# Patient Record
Sex: Female | Born: 1995 | Race: Black or African American | Hispanic: No | Marital: Single | Smoking: Never smoker
Health system: Southern US, Community
[De-identification: ages and names within clinical notes are randomized; demographics above are authoritative.]

## PROBLEM LIST (undated history)

## (undated) DIAGNOSIS — I10 Essential (primary) hypertension: Secondary | ICD-10-CM

## (undated) DIAGNOSIS — R000 Tachycardia, unspecified: Secondary | ICD-10-CM

## (undated) DIAGNOSIS — E049 Nontoxic goiter, unspecified: Secondary | ICD-10-CM

## (undated) DIAGNOSIS — L83 Acanthosis nigricans: Secondary | ICD-10-CM

## (undated) DIAGNOSIS — Z9109 Other allergy status, other than to drugs and biological substances: Secondary | ICD-10-CM

## (undated) DIAGNOSIS — E109 Type 1 diabetes mellitus without complications: Secondary | ICD-10-CM

## (undated) DIAGNOSIS — E1143 Type 2 diabetes mellitus with diabetic autonomic (poly)neuropathy: Secondary | ICD-10-CM

## (undated) DIAGNOSIS — E063 Autoimmune thyroiditis: Secondary | ICD-10-CM

## (undated) DIAGNOSIS — E11649 Type 2 diabetes mellitus with hypoglycemia without coma: Secondary | ICD-10-CM

## (undated) DIAGNOSIS — E1142 Type 2 diabetes mellitus with diabetic polyneuropathy: Secondary | ICD-10-CM

## (undated) DIAGNOSIS — Z9289 Personal history of other medical treatment: Secondary | ICD-10-CM

## (undated) HISTORY — DX: Acanthosis nigricans: L83

## (undated) HISTORY — DX: Type 2 diabetes mellitus with hypoglycemia without coma: E11.649

## (undated) HISTORY — DX: Type 2 diabetes mellitus with diabetic polyneuropathy: E11.42

## (undated) HISTORY — DX: Nontoxic goiter, unspecified: E04.9

## (undated) HISTORY — DX: Other allergy status, other than to drugs and biological substances: Z91.09

## (undated) HISTORY — DX: Type 1 diabetes mellitus without complications: E10.9

## (undated) HISTORY — DX: Autoimmune thyroiditis: E06.3

## (undated) HISTORY — DX: Tachycardia, unspecified: R00.0

## (undated) HISTORY — DX: Type 2 diabetes mellitus with diabetic autonomic (poly)neuropathy: E11.43

## (undated) HISTORY — PX: COLONOSCOPY: SHX174

---
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
## 2014-02-07 ENCOUNTER — Other Ambulatory Visit: Payer: Self-pay | Admitting: *Deleted

## 2014-02-07 DIAGNOSIS — E119 Type 2 diabetes mellitus without complications: Secondary | ICD-10-CM

## 2014-02-07 MED ORDER — INSULIN GLARGINE 100 UNIT/ML SOLOSTAR PEN: PEN_INJECTOR | SUBCUTANEOUS | Status: DC

## 2014-02-25 ENCOUNTER — Ambulatory Visit: Payer: Medicaid Other | Admitting: "Endocrinology

## 2014-02-25 ENCOUNTER — Ambulatory Visit (INDEPENDENT_AMBULATORY_CARE_PROVIDER_SITE_OTHER): Payer: Self-pay | Admitting: "Endocrinology

## 2014-02-25 ENCOUNTER — Encounter: Payer: Self-pay | Admitting: "Endocrinology

## 2014-02-25 VITALS — BP 124/85 | HR 110 | Wt 132.0 lb

## 2014-02-25 DIAGNOSIS — Z9119 Patient's noncompliance with other medical treatment and regimen: Secondary | ICD-10-CM

## 2014-02-25 DIAGNOSIS — Z91199 Patient's noncompliance with other medical treatment and regimen due to unspecified reason: Secondary | ICD-10-CM

## 2014-02-25 DIAGNOSIS — I471 Supraventricular tachycardia: Secondary | ICD-10-CM

## 2014-02-25 DIAGNOSIS — E063 Autoimmune thyroiditis: Secondary | ICD-10-CM

## 2014-02-25 DIAGNOSIS — E109 Type 1 diabetes mellitus without complications: Secondary | ICD-10-CM

## 2014-02-25 DIAGNOSIS — IMO0002 Reserved for concepts with insufficient information to code with codable children: Secondary | ICD-10-CM

## 2014-02-25 DIAGNOSIS — I10 Essential (primary) hypertension: Secondary | ICD-10-CM

## 2014-02-25 DIAGNOSIS — B353 Tinea pedis: Secondary | ICD-10-CM

## 2014-02-25 DIAGNOSIS — E10649 Type 1 diabetes mellitus with hypoglycemia without coma: Secondary | ICD-10-CM

## 2014-02-25 DIAGNOSIS — E108 Type 1 diabetes mellitus with unspecified complications: Secondary | ICD-10-CM

## 2014-02-25 DIAGNOSIS — R000 Tachycardia, unspecified: Secondary | ICD-10-CM

## 2014-02-25 DIAGNOSIS — E049 Nontoxic goiter, unspecified: Secondary | ICD-10-CM

## 2014-02-25 DIAGNOSIS — E1069 Type 1 diabetes mellitus with other specified complication: Secondary | ICD-10-CM

## 2014-02-25 DIAGNOSIS — E1043 Type 1 diabetes mellitus with diabetic autonomic (poly)neuropathy: Secondary | ICD-10-CM

## 2014-02-25 DIAGNOSIS — G990 Autonomic neuropathy in diseases classified elsewhere: Secondary | ICD-10-CM

## 2014-02-25 DIAGNOSIS — R634 Abnormal weight loss: Secondary | ICD-10-CM

## 2014-02-25 DIAGNOSIS — E1065 Type 1 diabetes mellitus with hyperglycemia: Secondary | ICD-10-CM

## 2014-02-25 DIAGNOSIS — E1042 Type 1 diabetes mellitus with diabetic polyneuropathy: Secondary | ICD-10-CM

## 2014-02-25 DIAGNOSIS — Z23 Encounter for immunization: Secondary | ICD-10-CM

## 2014-02-25 LAB — POCT GLYCOSYLATED HEMOGLOBIN (HGB A1C): Hemoglobin A1C: >14

## 2014-02-25 LAB — GLUCOSE, POCT (MANUAL RESULT ENTRY): POC Glucose: 124 mg/dl — AB (ref 70–99)

## 2014-02-25 MED ORDER — INSULIN ASPART 100 UNIT/ML FLEXPEN: PEN_INJECTOR | SUBCUTANEOUS | Status: DC

## 2014-02-25 MED ORDER — KETOCONAZOLE 2 % EX CREA: 1.0000 "application " | TOPICAL_CREAM | Freq: Every day | CUTANEOUS | Status: DC

## 2014-02-25 MED ORDER — LISINOPRIL 2.5 MG PO TABS: 2.5000 mg | ORAL_TABLET | Freq: Every day | ORAL | Status: DC

## 2014-02-25 NOTE — Patient Instructions (Signed)
Follow up visit in one month.  

## 2014-02-25 NOTE — Progress Notes (Signed)
Subjective:  Patient Name: Lindsey Murillo Date of Birth: [birth year]  MRN: [id number]  Kitt Course  presents to the office today for follow-up evaluation and management of her type 1 diabetes, acanthosis nigricans, goiter, thyroiditis, tachycardia, peripheral neuropathy, autonomic neuropathy, and severe noncompliance.  HISTORY OF PRESENT ILLNESS:   Lindsey Murillo is a 18 y.o. African American young lady.   Lindsey Murillo was unaccompanied.  1. The patient was first referred to me on 03/23/05 by her primary care provider, Dr. Mickie Hillier, [place], for evaluation and management of new onset diabetes. Patient was 18 years old.   A. Beginning in about September 2006, the patient began to develop polyuria and polydipsia. She went to her primary care provider, who checked her blood sugar. Blood glucose in the office was 501. He admitted her to [facility] on or about 02/03/11 with the diagnosis of diabetes. Her hemoglobin A1c was 12.5%. Her insulin C-peptide was 2.18 (normal 0.8-3.9). Her glucose was 224. She was hospitalized for 3-4 days and had diabetes education as an inpatient. In retrospect the patient had had acanthosis nigricans for about a year. Dr. Wolfgang Phoenix started her on Lantus at 16 units at bedtime. He also put her on Humalog lispro insulin by sliding scale at meals. At the time of her presentation to me, she was having some blood sugars in the 50s and 60s and others were greater than 600.   B. Patient was having a lot of visual blurring. She had frequent hunger pains. She had several low blood sugars in the early morning hours. She was premenarchal. Past medical history was positive for asthma and allergies. She and her siblings lived with her mother. She was in the fifth grade getting A's, B's, and C's. She was playing team basketball and team soccer. Family history was positive for type 2 diabetes in the mother, maternal grandmother, and cousin. Mother was very overweight.  Mother also had a goiter.   C. On physical examination, her weight was 116.6 pounds. Her height was 60-3/4 inches. Her BMI was 22.3, which was at the 90th percentile and placed her in the overweight category. She was somewhat passive. Her insight was poor. She had a 25 g thyroid gland. She had 4+ acanthosis nigricans of the posterior and anterior neck. She had 3+ acanthosis of the axillae. Her breasts were early Tanner stage I-2. Abdomen was large. Laboratory data in our office showed a hemoglobin A1c of 9.4%. Her TSH was 2.58, her free T4 was 1.18, and her free T3 was 4.2. Her TPO antibody was elevated at 56.9. Her C-peptide was 0.89 (normal 0.80-3.9).  D. It appeared at that time that the patient really had a combination of type I and type 2 diabetes mellitus. Her overly fat adipose cells were making cytokines that caused resistance to insulin. Her young, healthy beta cells in her pancreas had previously made excess amounts of insulin, which in turn caused several adverse effects, to include acanthosis nigricans and increased gastric acid production which resulted in her being hungrier, eating more, and gaining more weight. At the time of her diagnosis in September, however, even though her C-peptide levels were in the mid-normal range, they were not high enough to control her blood sugars. At the time of her visit to me, she was making only small amounts of C-peptide. Dr.Luking had made the correct assessment that the patient would require a multiple daily injection (MDI) insulin regimen. Because functionally her diabetes was closer to type I than type II (  type I.4), and because she would need insulin supplies, blood glucose test strips, and other items in the way typically used by type I diabetics, I administratively classified her as having type 1 diabetes. We performed additional diabetes education in our office for the patient and her mother as part of her Diabetes Survival Skills Program. I continued her  Lantus dose, but converted her Humalog sliding scale regimen to our two-component (Correction Dose and Food Dose) regimen.   2. During the past 9 years we have had some areas of success and some areas of poor results.  A. During the 18 months after her first visit, she continued to gain height and weight along the 95th to 98th percent curves. At age 82, the growth velocities for height and weight began to slow. The patient's height has been virtually static for the last 3 years. Patient's weight has fluctuated a great deal, but she has been lowing weight steadily for the past year.   B. Her hemoglobin A1c values have varied from 6.9- >14%.  On 09/12/07 her C-peptide was 4.99. On 05/28/08 her C-peptide was 2.75.  C. Part of that variability in her BGs has been due to her non-compliance with her diabetes care plan, but part of the variability has also been due to the frequency with which she attended clinic. In 2007 she came to clinic 3 times. In 2008 she came twice. In 2009 she came once. In 2010 she came 4 times. In 2011 she came twice. In 2012 she came to clinic 3 times. In 2013 she came once. In 2014 she came three times. Today is her first visit of 2015.  3. The patient's last [facility] visit was on 03/13/13. In the interim, she has been fairly healthy. She went to the ED in January 2014 for nasal congestion and sinusitis. She went to the ED again in April, that time for a right thigh abscess. Lantus dose remains at 50 units, when she takes it. She is on the Novolog 150/30/5 plan, when she follows it. She misses many doses of both insulins. She took lisinopril for a while, then stopped it, but can't explain why. Lindsey Murillo said she has been checking her BGs 3 times per day, but her BG download revealed that she was lying. She says that on a depression scale of 1-10 she is good at a 2. She feels that her depression is better because she's been taking better care of herself.  That was also a lie.   4. Pertinent  Review of Systems:  Constitutional: The patient initially said that she felt "good". She has not been very tired lately.   Eyes: Vision seems to be good. There are no recognized eye problems. Her last eye examination was in January 2014.  Neck: The patient has no complaints of anterior neck swelling, soreness, tenderness, pressure, discomfort, or difficulty swallowing.   Heart: Sometimes her heart beats fast even if she is not physically active. She is not aware of her heart rate increasing with exercise or other physical activity. The patient has no complaints of palpitations, irregular heart beats, chest pain, or chest pressure.   Gastrointestinal: She has not been having bloating after meals or constipation recently. She patient has no complaints of excessive hunger, acid reflux, upset stomach, stomach aches or pains, or diarrhea.  Legs: Her knees occasionally hurt. Muscle mass and strength seem normal. There are no complaints of numbness, tingling, burning, or pain. No edema is noted.  Feet: There are no  obvious foot problems. There are no complaints of numbness, tingling, burning, or pain. No edema is noted. Neurologic: There are no recognized problems with muscle movement and strength, sensation, or coordination. GYN: LMP was two weeks ago. Cycles have been regular. Hypoglycemia: She had not had hypoglycemia very often. She had one 59 two weeks ago.   5. BG printout: According to the BG meter she brought with her, she has checked BGs 10 times in the past 3 weeks. She says that she has another meter at home that has more BG checks. Her BGs varied from 182 to 418. She says that the 418 was her mother's BG.   PAST MEDICAL, FAMILY, AND SOCIAL HISTORY  Past Medical History  Diagnosis Date  . Type 1 diabetes mellitus in patient age 3-19 years with HbA1C goal below 7.5   . Acanthosis nigricans, acquired   . Overweight for pediatric patient   . Hypoglycemia associated with diabetes   . Goiter    . Tachycardia   . Diabetic autonomic neuropathy   . Diabetic peripheral neuropathy   . Thyroiditis, autoimmune   . Asthma   . Environmental allergies     Family History  Problem Relation Age of Onset  . Diabetes Mother     Type II DM  . Thyroid disease Mother   . Diabetes Maternal Grandmother     Type II DM  . Diabetes Cousin     Type II DM    Current outpatient prescriptions:glucagon 1 MG injection, Follow package directions for low blood sugar., Disp: 2 each, Rfl: 3;  Insulin Glargine (LANTUS SOLOSTAR) 100 UNIT/ML Solostar Pen, Use up to 50 units daily, Disp: 5 pen, Rfl: 1;  NOVOLOG FLEXPEN 100 UNIT/ML FlexPen, USE AS DIRECTED UP TO 50 UNITS PER SLIDING SCALE., Disp: 5 pen, Rfl: 6 albuterol (PROVENTIL HFA;VENTOLIN HFA) 108 (90 BASE) MCG/ACT inhaler, Inhale 2 puffs into the lungs every 6 (six) hours as needed for wheezing., Disp: , Rfl: ;  sulfamethoxazole-trimethoprim (SEPTRA DS) 800-160 MG per tablet, Take 1 tablet by mouth every 12 (twelve) hours., Disp: 20 tablet, Rfl: 0  Allergies as of 02/25/2014  . (No Known Allergies)     reports that she has never smoked. She has never used smokeless tobacco. She reports that she does not drink alcohol or use illicit drugs. Pediatric History  Patient Guardian Status  . Mother:  Doring,Luciana   Other Topics Concern  . Not on file   Social History Narrative  . No narrative on file    1. School and Family: Patient graduated from high school. She is not attending [employer] now, but wants to return in January. She would like to major in athletic training. 2. Activities: She walks 20-30 minutes about 3 times per week. Her stamina has improved. She is still unemployed. 3. Primary Care Provider: Dr. Wolfgang Phoenix  REVIEW OF SYSTEMS: There are no other significant problems involving Lindsey Murillo's other body systems.   Objective:  Vital Signs:  BP 124/85  Pulse 110  Wt 132 lb (59.875 kg)   Ht Readings from Last 3  Encounters:  09/05/13 5\' 7"  (1.702 m) (86%*, Z = 1.07)  09/03/13 5\' 8"  (1.727 m) (93%*, Z = 1.46)  05/24/13 5\' 8"  (1.727 m) (93%*, Z = 1.47)   * Growth percentiles are based on CDC 2-20 Years data.   Wt Readings from Last 3 Encounters:  02/25/14 132 lb (59.875 kg) (58%*, Z = 0.21)  09/05/13 140 lb 6 oz (63.674 kg) (72%*,  Z = 0.59)  09/03/13 137 lb (62.143 kg) (68%*, Z = 0.46)   * Growth percentiles are based on CDC 2-20 Years data.   There is no height on file to calculate BSA. No height on file for this encounter. 58%ile (Z=0.21) based on CDC 2-20 Years weight-for-age data.  PHYSICAL EXAM:  Constitutional: The patient appears healthy and more slender today.  She has lost 12 lbs since last visit. Her heart rate has decreased from 112 to 110.  Head: The head is normocephalic. Face: The face appears normal. There are no obvious dysmorphic features. Eyes: There is no obvious arcus or proptosis.Her eyes are dry. Ears: The ears are normally placed and appear externally normal. Mouth: The oropharynx and tongue appear normal. Dentition appears to be normal for age. Her mouth is relatively dry.  Neck: The neck appears to be visibly normal. No carotid bruits are noted. The thyroid gland is smaller today, but still enlarged at 23-24 grams in size. The left lobe is larger, but both lobes are enlarged. The consistency of the thyroid gland is relatively firm on the left. She is tender to palpation today in the right mid-lobe, just as she was at last visit. She has 2+ acanthosis nigricans.   Lungs: The lungs are clear to auscultation. Air movement is good. Heart: Heart rate and rhythm are regular. Heart sounds S1 and S2 are normal. I did not appreciate any pathologic cardiac murmurs. Abdomen: The abdomen is normal in size for the patient's age. Bowel sounds are normal. There is no obvious hepatomegaly, splenomegaly, or other mass effect.  Arms: Muscle size and bulk are normal for age. Hands: There  is no obvious tremor. Phalangeal and metacarpophalangeal joints are normal. Palmar muscles are normal for age. Palmar skin is normal. Palmar moisture is also normal. Legs: Muscles appear normal for age. No edema is present. Feet: Feet are normally formed. She has 1+ calluses. Dorsalis pedal pulses are normal 1+ bilaterally. She has 1+ tinea pedis.  Neurologic: Strength is normal for age in both the upper and lower extremities. Muscle tone is normal. Sensation to touch is normal in both the legs and both feet.     LAB DATA: Hemoglobin A1c today is >14%, compared with > 14% at last visit and with >14% at the visit prior.   Labs 09/03/13: CBC Hgb 15, Hct 40.7%; Sodium 130, CO2 26, glucose 625,   Labs 11/28/12: CMP was normal, except for glucose of 386.; cholesterol 231, triglycerides 234, HDL 64, LDL 120; urinary microalbumin/creatinine ratio was 8.2; TSH 1.175, free T4 1.26, free T3  2.7    Labs 08/26/11: CMP had glucose of 225, AST of 52, and ALT of 55. TSH was 1.008, free T4 1.23, free T3 2.9. Microalbumin/creatinine ratio was 4.5.    Assessment and Plan:   ASSESSMENT:  1. Type 1 diabetes mellitus: Patient's blood glucose control is terrible once again. When I confronted her with her A1c value she admitted that she has often been skipping both insulins. She can't/won't answer my question, "Why aren't you taking your insulins and checking your BGs?"  2. Hypoglycemia: she has not had any documented low BGs this month. The 59 she claimed to have had two weeks ago was not in the memory of the meter she brought with her today. 3. Goiter: The thyroid gland is smaller, but also tender again today in the right lobe, c/w a flare up of Hashimoto's disease. The waxing and waning of thyroid gland size and the  gland tenderness are also c/w evolving Hashimoto's disease.  4. Hashimoto's disease: Her thyroiditis is clinically active again today.  5. Peripheral neuropathy: She shows no evidence of this today.  6.  Autonomic neuropathy and tachycardia: These problems are still severe, but are also reversible if she gets the BGs under control. 7. Dehydration: She is not dehydrated today. 8. Hypertension: Her DBP is elevated again. She needs to resume taking lisinopril. 9. Tinea pedis: She needs ketoconazole.  10. Weight loss,unintentional: She is severely under-insulinized.   PLAN:  1. Diagnostic: BG checks at meals and at bedtime.    2. Therapeutic: Get back on her plan of checking blood sugars at mealtimes and bedtime and taking the appropriate Humalog doses. Re-start lisinopril, 2.5 mg/day  and ketoconazole 2% cream, apply twice daily to feet.  3. Patient education: She can be a healthy and happy adult and a better athlete if she controls her blood sugars better. 4. Follow-up: 1 month  Level of Service: This visit lasted in excess of 55 minutes. More than 50% of the visit was devoted to counseling.  Sherrlyn Hock, MD

## 2014-02-26 DIAGNOSIS — E1065 Type 1 diabetes mellitus with hyperglycemia: Secondary | ICD-10-CM | POA: Insufficient documentation

## 2014-02-26 DIAGNOSIS — E109 Type 1 diabetes mellitus without complications: Secondary | ICD-10-CM | POA: Insufficient documentation

## 2014-02-26 DIAGNOSIS — E108 Type 1 diabetes mellitus with unspecified complications: Secondary | ICD-10-CM

## 2014-02-26 DIAGNOSIS — Z9119 Patient's noncompliance with other medical treatment and regimen: Secondary | ICD-10-CM | POA: Insufficient documentation

## 2014-04-01 ENCOUNTER — Ambulatory Visit: Payer: Medicaid Other | Admitting: "Endocrinology

## 2014-05-21 ENCOUNTER — Ambulatory Visit: Payer: Medicaid Other | Admitting: "Endocrinology

## 2014-08-13 ENCOUNTER — Encounter (HOSPITAL_COMMUNITY): Payer: Self-pay | Admitting: Emergency Medicine

## 2014-08-13 ENCOUNTER — Emergency Department (HOSPITAL_COMMUNITY)
Admission: EM | Admit: 2014-08-13 | Discharge: 2014-08-13 | Disposition: A | Discharge status: Home / Self Care | Payer: 59 | Attending: Emergency Medicine | Admitting: Emergency Medicine

## 2014-08-13 DIAGNOSIS — Z792 Long term (current) use of antibiotics: Secondary | ICD-10-CM | POA: Diagnosis not present

## 2014-08-13 DIAGNOSIS — E1065 Type 1 diabetes mellitus with hyperglycemia: Secondary | ICD-10-CM | POA: Diagnosis not present

## 2014-08-13 DIAGNOSIS — N390 Urinary tract infection, site not specified: Secondary | ICD-10-CM | POA: Diagnosis not present

## 2014-08-13 DIAGNOSIS — N760 Acute vaginitis: Secondary | ICD-10-CM | POA: Diagnosis not present

## 2014-08-13 DIAGNOSIS — E860 Dehydration: Secondary | ICD-10-CM | POA: Insufficient documentation

## 2014-08-13 DIAGNOSIS — Z3202 Encounter for pregnancy test, result negative: Secondary | ICD-10-CM | POA: Diagnosis not present

## 2014-08-13 DIAGNOSIS — R739 Hyperglycemia, unspecified: Secondary | ICD-10-CM

## 2014-08-13 DIAGNOSIS — Z79899 Other long term (current) drug therapy: Secondary | ICD-10-CM | POA: Diagnosis not present

## 2014-08-13 DIAGNOSIS — Z794 Long term (current) use of insulin: Secondary | ICD-10-CM | POA: Insufficient documentation

## 2014-08-13 DIAGNOSIS — R000 Tachycardia, unspecified: Secondary | ICD-10-CM | POA: Diagnosis not present

## 2014-08-13 DIAGNOSIS — J45909 Unspecified asthma, uncomplicated: Secondary | ICD-10-CM | POA: Diagnosis not present

## 2014-08-13 DIAGNOSIS — R300 Dysuria: Secondary | ICD-10-CM | POA: Diagnosis present

## 2014-08-13 LAB — CBC
HEMATOCRIT: 41.4 % (ref 36.0–46.0)
HEMOGLOBIN: 14.5 g/dL (ref 12.0–15.0)
MCH: 29.1 pg (ref 26.0–34.0)
MCHC: 35 g/dL (ref 30.0–36.0)
MCV: 83 fL (ref 78.0–100.0)
Platelets: 218 10*3/uL (ref 150–400)
RBC: 4.99 MIL/uL (ref 3.87–5.11)
RDW: 11.9 % (ref 11.5–15.5)
WBC: 7.9 10*3/uL (ref 4.0–10.5)

## 2014-08-13 LAB — BASIC METABOLIC PANEL
Anion gap: 14 (ref 5–15)
BUN: 13 mg/dL (ref 6–23)
CO2: 27 mmol/L (ref 19–32)
Calcium: 9.6 mg/dL (ref 8.4–10.5)
Chloride: 94 mmol/L — ABNORMAL LOW (ref 96–112)
Creatinine, Ser: 0.71 mg/dL (ref 0.50–1.10)
GFR calc non Af Amer: >90 mL/min (ref >90)
Glucose, Bld: 400 mg/dL — ABNORMAL HIGH (ref 70–99)
Potassium: 4.3 mmol/L (ref 3.5–5.1)
Sodium: 135 mmol/L (ref 135–145)

## 2014-08-13 LAB — URINE MICROSCOPIC-ADD ON

## 2014-08-13 LAB — URINALYSIS, ROUTINE W REFLEX MICROSCOPIC
Bilirubin Urine: NEGATIVE
Glucose, UA: >1000 mg/dL — AB
NITRITE: NEGATIVE
PH: 5.5 (ref 5.0–8.0)
Specific Gravity, Urine: 1.02 (ref 1.005–1.030)
Urobilinogen, UA: 0.2 mg/dL (ref 0.0–1.0)

## 2014-08-13 LAB — PREGNANCY, URINE: PREG TEST UR: NEGATIVE

## 2014-08-13 LAB — CBG MONITORING, ED
Glucose-Capillary: 360 mg/dL — ABNORMAL HIGH (ref 70–99)
Glucose-Capillary: 240 mg/dL — ABNORMAL HIGH (ref 70–99)

## 2014-08-13 MED ORDER — INSULIN ASPART 100 UNIT/ML ~~LOC~~ SOLN
10.0000 [IU] | Freq: Once | SUBCUTANEOUS | Status: AC
  Administered 2014-08-13: 10 [IU] via SUBCUTANEOUS
  Filled 2014-08-13: qty 1

## 2014-08-13 MED ORDER — SODIUM CHLORIDE 0.9 % IV BOLUS (SEPSIS)
1000.0000 mL | Freq: Once | INTRAVENOUS | Status: AC
  Administered 2014-08-13: 1000 mL via INTRAVENOUS

## 2014-08-13 MED ORDER — CEPHALEXIN 500 MG PO CAPS: 500.0000 mg | ORAL_CAPSULE | Freq: Four times a day (QID) | ORAL | Status: DC

## 2014-08-13 MED ORDER — FLUCONAZOLE 150 MG PO TABS: 150.0000 mg | ORAL_TABLET | Freq: Once | ORAL | Status: DC

## 2014-08-13 MED ORDER — DEXTROSE 5 % IV SOLN
1.0000 g | Freq: Once | INTRAVENOUS | Status: AC
  Administered 2014-08-13: 1 g via INTRAVENOUS
  Filled 2014-08-13: qty 10

## 2014-08-13 NOTE — ED Notes (Signed)
Patient complaining of burning with urination x 4 days.

## 2014-08-13 NOTE — Discharge Instructions (Signed)
Please call your doctor for a followup appointment within 24-48 hours. When you talk to your doctor please let them know that you were seen in the emergency department and have them acquire all of your records so that they can discuss the findings with you and formulate a treatment plan to fully care for your new and ongoing problems. ° °

## 2014-08-13 NOTE — ED Provider Notes (Signed)
CSN: [id number]     Arrival date & time 08/13/14  0727 History   First MD Initiated Contact with Patient 08/13/14 [phone]     Chief Complaint  Patient presents with  . Dysuria     (Consider location/radiation/quality/duration/timing/severity/associated sxs/prior Treatment) HPI Comments: The patient is a 19 year old female, she is an insulin-requiring diabetic. She presents with a complaint of dysuria which has been present for approximately 4 days. This is fluctuating in intensity, sometimes it is mild, sometimes it is severe, it is worse with wiping after urination, not associated with any systemic symptoms including fevers chills nausea or vomiting. She has had no medications prior to arrival, denies any sexual activity ever, states that she has good blood sugar control and takes her insulin with meals, she has not had any insulin this morning and she has not eaten. Review of the medical record shows that she had a hemoglobin A1c drawn in October 2015 which was over 14.  Patient is a 19 y.o. female presenting with dysuria. The history is provided by the patient and a parent.  Dysuria   Past Medical History  Diagnosis Date  . Type 1 diabetes mellitus in patient age 35-19 years with HbA1C goal below 7.5   . Acanthosis nigricans, acquired   . Overweight for pediatric patient   . Hypoglycemia associated with diabetes   . Goiter   . Tachycardia   . Diabetic autonomic neuropathy   . Diabetic peripheral neuropathy   . Thyroiditis, autoimmune   . Asthma   . Environmental allergies    History reviewed. No pertinent past surgical history. Family History  Problem Relation Age of Onset  . Diabetes Mother     Type II DM  . Thyroid disease Mother   . Diabetes Maternal Grandmother     Type II DM  . Diabetes Cousin     Type II DM   History  Substance Use Topics  . Smoking status: Never Smoker   . Smokeless tobacco: Never Used  . Alcohol Use: No   OB History    No data available      Review of Systems  Genitourinary: Positive for dysuria.  All other systems reviewed and are negative.     Allergies  Review of patient's allergies indicates no known allergies.  Home Medications   Prior to Admission medications   Medication Sig Start Date End Date Taking? Authorizing Provider  albuterol (PROVENTIL HFA;VENTOLIN HFA) 108 (90 BASE) MCG/ACT inhaler Inhale 2 puffs into the lungs every 6 (six) hours as needed for wheezing.   Yes Historical Provider, MD  glucagon 1 MG injection Follow package directions for low blood sugar. 01/05/12  Yes Sherrlyn Hock, MD  ibuprofen (ADVIL,MOTRIN) 200 MG tablet Take 400 mg by mouth every 6 (six) hours as needed for moderate pain.   Yes Historical Provider, MD  insulin aspart (NOVOLOG FLEXPEN) 100 UNIT/ML FlexPen Use according to the 2-component method at meals and at bedtime. Patient taking differently: Inject 0-20 Units into the skin 4 (four) times daily -  with meals and at bedtime. Use according to the 2-component method at meals and at bedtime. 02/25/14 02/26/15 Yes Sherrlyn Hock, MD  Insulin Glargine (LANTUS SOLOSTAR) 100 UNIT/ML Solostar Pen Use up to 50 units daily Patient taking differently: Inject 50 Units into the skin at bedtime.  02/07/14  Yes Sherrlyn Hock, MD  cephALEXin (KEFLEX) 500 MG capsule Take 1 capsule (500 mg total) by mouth 4 (four) times daily. 08/13/14  Noemi Chapel, MD  fluconazole (DIFLUCAN) 150 MG tablet Take 1 tablet (150 mg total) by mouth once. 08/13/14   Noemi Chapel, MD  ketoconazole (NIZORAL) 2 % cream Apply 1 application topically daily. Patient not taking: Reported on 08/13/2014 02/25/14   Sherrlyn Hock, MD  lisinopril (PRINIVIL,ZESTRIL) 2.5 MG tablet Take 1 tablet (2.5 mg total) by mouth daily. Patient not taking: Reported on 08/13/2014 02/25/14   Sherrlyn Hock, MD  sulfamethoxazole-trimethoprim (SEPTRA DS) 800-160 MG per tablet Take 1 tablet by mouth every 12 (twelve) hours. Patient not  taking: Reported on 08/13/2014 09/03/13   [name], PA-C   BP 113/83 mmHg  Pulse 90  Temp(Src) 97.8 F (36.6 C) (Oral)  Resp 16  Ht 5\' 4"  (1.626 m)  Wt 138 lb (62.596 kg)  BMI 23.68 kg/m2  SpO2 100%  LMP 07/22/2014 Physical Exam  Constitutional: She appears well-developed and well-nourished. No distress.  HENT:  Head: Normocephalic and atraumatic.  Mouth/Throat: Oropharynx is clear and moist. No oropharyngeal exudate.  Eyes: Conjunctivae and EOM are normal. Pupils are equal, round, and reactive to light. Right eye exhibits no discharge. Left eye exhibits no discharge. No scleral icterus.  Neck: Normal range of motion. Neck supple. No JVD present. No thyromegaly present.  Cardiovascular: Regular rhythm, normal heart sounds and intact distal pulses.  Exam reveals no gallop and no friction rub.   No murmur heard. Mildly tachycardic  Pulmonary/Chest: Effort normal and breath sounds normal. No respiratory distress. She has no wheezes. She has no rales.  Abdominal: Soft. Bowel sounds are normal. She exhibits no distension and no mass. There is tenderness (minimal left mid abdominal tenderness, no guarding or rebound).  Genitourinary:  Chaperone present for exam, small amount of white discharge around the vaginal opening, no labial abscesses, several areas of erythematous submillimeter papules, no vesicles, no pustules, no induration  Musculoskeletal: Normal range of motion. She exhibits no edema or tenderness.  Lymphadenopathy:    She has no cervical adenopathy.  Neurological: She is alert. Coordination normal.  Skin: Skin is warm and dry. No rash noted. No erythema.  Psychiatric: She has a normal mood and affect. Her behavior is normal.  Nursing note and vitals reviewed.   ED Course  Procedures (including critical care time) Labs Review Labs Reviewed  URINALYSIS, ROUTINE W REFLEX MICROSCOPIC - Abnormal; Notable for the following:    Glucose, UA >1000 (*)    Hgb urine dipstick LARGE  (*)    Ketones, ur >80 (*)    Protein, ur TRACE (*)    Leukocytes, UA TRACE (*)    All other components within normal limits  URINE MICROSCOPIC-ADD ON - Abnormal; Notable for the following:    Squamous Epithelial / LPF MANY (*)    Bacteria, UA MANY (*)    All other components within normal limits  BASIC METABOLIC PANEL - Abnormal; Notable for the following:    Chloride 94 (*)    Glucose, Bld 400 (*)    All other components within normal limits  CBG MONITORING, ED - Abnormal; Notable for the following:    Glucose-Capillary 360 (*)    All other components within normal limits  CBG MONITORING, ED - Abnormal; Notable for the following:    Glucose-Capillary 240 (*)    All other components within normal limits  PREGNANCY, URINE  CBC    Imaging Review No results found.    MDM   Final diagnoses:  Vaginitis  UTI (lower urinary tract infection)  Hyperglycemia  Dehydration    The patient does have a rash in the vaginal area, mildly tachycardic, suspect hyperglycemia, possible urinary tract infection though vaginitis is likely given exam. Labs pending, patient appears hemodynamically stable.  No signs of DKA on blood work, no AG, no acidosis - ketones in UA likely from dehdyradtion - 2  L of IVF and rocephin and heart rate is now normal - VS as below, pt in agreement for d/c.  Filed Vitals:   08/13/14 0733 08/13/14 0947  BP: 114/86 113/83  Pulse: 115 90  Temp: 97.8 F (36.6 C)   TempSrc: Oral   Resp: 14 16  Height: 5\' 4"  (1.626 m)   Weight: 138 lb (62.596 kg)   SpO2: 99% 100%     Meds given in ED:  Medications  cefTRIAXone (ROCEPHIN) 1 g in dextrose 5 % 50 mL IVPB (0 g Intravenous Stopped 08/13/14 0935)  sodium chloride 0.9 % bolus 1,000 mL (0 mLs Intravenous Stopped 08/13/14 0940)  sodium chloride 0.9 % bolus 1,000 mL (0 mLs Intravenous Stopped 08/13/14 1047)  insulin aspart (novoLOG) injection 10 Units (10 Units Subcutaneous Given 08/13/14 0935)    New Prescriptions    CEPHALEXIN (KEFLEX) 500 MG CAPSULE    Take 1 capsule (500 mg total) by mouth 4 (four) times daily.   FLUCONAZOLE (DIFLUCAN) 150 MG TABLET    Take 1 tablet (150 mg total) by mouth once.      Noemi Chapel, MD 08/13/14 [phone]

## 2014-09-25 ENCOUNTER — Ambulatory Visit: Payer: 59 | Admitting: Family Medicine

## 2015-01-07 ENCOUNTER — Emergency Department (HOSPITAL_COMMUNITY)
Admission: EM | Admit: 2015-01-07 | Discharge: 2015-01-07 | Disposition: A | Discharge status: Home / Self Care | Payer: 59 | Attending: Emergency Medicine | Admitting: Emergency Medicine

## 2015-01-07 ENCOUNTER — Encounter (HOSPITAL_COMMUNITY): Payer: Self-pay | Admitting: Emergency Medicine

## 2015-01-07 DIAGNOSIS — Z79899 Other long term (current) drug therapy: Secondary | ICD-10-CM | POA: Diagnosis not present

## 2015-01-07 DIAGNOSIS — Z794 Long term (current) use of insulin: Secondary | ICD-10-CM | POA: Insufficient documentation

## 2015-01-07 DIAGNOSIS — Z9119 Patient's noncompliance with other medical treatment and regimen: Secondary | ICD-10-CM | POA: Insufficient documentation

## 2015-01-07 DIAGNOSIS — E1043 Type 1 diabetes mellitus with diabetic autonomic (poly)neuropathy: Secondary | ICD-10-CM | POA: Insufficient documentation

## 2015-01-07 DIAGNOSIS — R739 Hyperglycemia, unspecified: Secondary | ICD-10-CM

## 2015-01-07 DIAGNOSIS — E663 Overweight: Secondary | ICD-10-CM | POA: Diagnosis not present

## 2015-01-07 DIAGNOSIS — J45909 Unspecified asthma, uncomplicated: Secondary | ICD-10-CM | POA: Insufficient documentation

## 2015-01-07 DIAGNOSIS — E1065 Type 1 diabetes mellitus with hyperglycemia: Secondary | ICD-10-CM | POA: Insufficient documentation

## 2015-01-07 DIAGNOSIS — Z91199 Patient's noncompliance with other medical treatment and regimen due to unspecified reason: Secondary | ICD-10-CM

## 2015-01-07 LAB — CBC WITH DIFFERENTIAL/PLATELET
Basophils Absolute: 0 10*3/uL (ref 0.0–0.1)
Basophils Relative: 0 % (ref 0–1)
Eosinophils Absolute: 0.1 10*3/uL (ref 0.0–0.7)
Eosinophils Relative: 1 % (ref 0–5)
HEMATOCRIT: 36.9 % (ref 36.0–46.0)
HEMOGLOBIN: 12.9 g/dL (ref 12.0–15.0)
LYMPHS ABS: 2.1 10*3/uL (ref 0.7–4.0)
Lymphocytes Relative: 23 % (ref 12–46)
MCH: 29.2 pg (ref 26.0–34.0)
MCHC: 35 g/dL (ref 30.0–36.0)
MCV: 83.5 fL (ref 78.0–100.0)
MONO ABS: 0.5 10*3/uL (ref 0.1–1.0)
MONOS PCT: 5 % (ref 3–12)
NEUTROS ABS: 6.6 10*3/uL (ref 1.7–7.7)
NEUTROS PCT: 71 % (ref 43–77)
Platelets: 180 10*3/uL (ref 150–400)
RBC: 4.42 MIL/uL (ref 3.87–5.11)
RDW: 12.2 % (ref 11.5–15.5)
WBC: 9.2 10*3/uL (ref 4.0–10.5)

## 2015-01-07 LAB — URINE MICROSCOPIC-ADD ON

## 2015-01-07 LAB — BASIC METABOLIC PANEL
ANION GAP: 8 (ref 5–15)
BUN: 14 mg/dL (ref 6–20)
CALCIUM: 9 mg/dL (ref 8.9–10.3)
CHLORIDE: 96 mmol/L — AB (ref 101–111)
CO2: 29 mmol/L (ref 22–32)
CREATININE: 0.56 mg/dL (ref 0.44–1.00)
GFR calc non Af Amer: >60 mL/min (ref >60)
GLUCOSE: 412 mg/dL — AB (ref 65–99)
Potassium: 3.9 mmol/L (ref 3.5–5.1)
Sodium: 133 mmol/L — ABNORMAL LOW (ref 135–145)

## 2015-01-07 LAB — URINALYSIS, ROUTINE W REFLEX MICROSCOPIC
BILIRUBIN URINE: NEGATIVE
HGB URINE DIPSTICK: NEGATIVE
Ketones, ur: 15 mg/dL — AB
Leukocytes, UA: NEGATIVE
Nitrite: NEGATIVE
PH: 6 (ref 5.0–8.0)
Protein, ur: NEGATIVE mg/dL
SPECIFIC GRAVITY, URINE: 1.02 (ref 1.005–1.030)
Urobilinogen, UA: 0.2 mg/dL (ref 0.0–1.0)

## 2015-01-07 LAB — CBG MONITORING, ED
GLUCOSE-CAPILLARY: 280 mg/dL — AB (ref 65–99)
Glucose-Capillary: 374 mg/dL — ABNORMAL HIGH (ref 65–99)

## 2015-01-07 MED ORDER — INSULIN NPH ISOPHANE & REGULAR (70-30) 100 UNIT/ML ~~LOC~~ SUSP: 15.0000 [IU] | Freq: Two times a day (BID) | SUBCUTANEOUS | Status: DC

## 2015-01-07 MED ORDER — SODIUM CHLORIDE 0.9 % IV BOLUS (SEPSIS)
1000.0000 mL | Freq: Once | INTRAVENOUS | Status: AC
  Administered 2015-01-07: 1000 mL via INTRAVENOUS

## 2015-01-07 NOTE — Progress Notes (Signed)
CM called to ED for medication assistance. Pt stated that she works at [employer] in [place] and is currently insured. Pt also stated that she goes to [facility] for PCP follow up and DM management. Pt given number for Dr. Dorris Fetch and encouraged pt to call and schedule an appointment. Pt also instructed that the novolin 70/30 is a generic insulin that she can get for $25 at [facility]. Pt stated that she could afford that. I notified pt that insulin should be cheaper since she does have insurance. Pt verbalized understanding of above. CM also notified Dr. Thurnell Garbe to order the novolin 70/30 for pt at discharge, which will be more affordable for pt with insurance and without. Pt stated she has all other DM supplies that she would need.

## 2015-01-07 NOTE — ED Provider Notes (Signed)
CSN: [id number]     Arrival date & time 01/07/15  1438 History   First MD Initiated Contact with Patient 01/07/15 1541     Chief Complaint  Patient presents with  . Hyperglycemia     (Consider location/radiation/quality/duration/timing/severity/associated sxs/prior Treatment) HPI   Lindsey Murillo is a 19 y.o. female who presents for evaluation of hyperglycemia. She went to her PCP office today for a new visit. Labs done last week showed hemoglobin A1c high at 19. Today in the office. Her CBG was unreadable so they sent her here. She has been off her medications for greater than 1 year due to inability to afford them. She denies fever, chills, nausea, vomiting, weakness or dizziness. There are no other known modifying factors.   Past Medical History  Diagnosis Date  . Type 1 diabetes mellitus in patient age 3-19 years with HbA1C goal below 7.5   . Acanthosis nigricans, acquired   . Overweight for pediatric patient   . Hypoglycemia associated with diabetes   . Goiter   . Tachycardia   . Diabetic autonomic neuropathy   . Diabetic peripheral neuropathy   . Thyroiditis, autoimmune   . Asthma   . Environmental allergies    History reviewed. No pertinent past surgical history. Family History  Problem Relation Age of Onset  . Diabetes Mother     Type II DM  . Thyroid disease Mother   . Diabetes Maternal Grandmother     Type II DM  . Diabetes Cousin     Type II DM   Social History  Substance Use Topics  . Smoking status: Never Smoker   . Smokeless tobacco: Never Used  . Alcohol Use: No   OB History    No data available     Review of Systems  All other systems reviewed and are negative.     Allergies  Review of patient's allergies indicates no known allergies.  Home Medications   Prior to Admission medications   Medication Sig Start Date End Date Taking? Authorizing Provider  glucagon 1 MG injection Follow package directions for low blood sugar. 01/05/12    Sherrlyn Hock, MD  ibuprofen (ADVIL,MOTRIN) 200 MG tablet Take 400 mg by mouth every 6 (six) hours as needed for moderate pain.    Historical Provider, MD  insulin NPH-regular Human (NOVOLIN 70/30) (70-30) 100 UNIT/ML injection Inject 15 Units into the skin 2 (two) times daily with a meal. 01/07/15   Daleen Bo, MD   BP 114/82 mmHg  Pulse 102  Temp(Src) 98.2 F (36.8 C) (Oral)  Resp 18  Ht 5\' 8"  (1.727 m)  Wt 109 lb (49.442 kg)  BMI 16.58 kg/m2  SpO2 100%  LMP 12/27/2014 Physical Exam  Constitutional: She is oriented to person, place, and time. She appears well-developed and well-nourished. No distress.  HENT:  Head: Normocephalic and atraumatic.  Right Ear: External ear normal.  Left Ear: External ear normal.  Eyes: Conjunctivae and EOM are normal. Pupils are equal, round, and reactive to light.  Neck: Normal range of motion and phonation normal. Neck supple.  Cardiovascular: Normal rate, regular rhythm and normal heart sounds.   Pulmonary/Chest: Effort normal and breath sounds normal. She exhibits no bony tenderness.  Abdominal: Soft. There is no tenderness.  Musculoskeletal: Normal range of motion.  Neurological: She is alert and oriented to person, place, and time. No cranial nerve deficit or sensory deficit. She exhibits normal muscle tone. Coordination normal.  Skin: Skin is warm, dry and intact.  Psychiatric: She has a normal mood and affect. Her behavior is normal. Judgment and thought content normal.  Nursing note and vitals reviewed.   ED Course  Procedures (including critical care time) Medications  sodium chloride 0.9 % bolus 1,000 mL (0 mLs Intravenous Stopped 01/07/15 1754)    Patient Vitals for the past 24 hrs:  BP Temp Temp src Pulse Resp SpO2 Height Weight  01/07/15 1752 114/82 mmHg - - 102 18 100 % - -  01/07/15 1516 112/75 mmHg - - 93 - 100 % - -  01/07/15 1442 - - - - - - - 109 lb (49.442 kg)  01/07/15 1441 115/76 mmHg 98.2 F (36.8 C) Oral 104 16  99 % 5\' 8"  (1.727 m) -    6:22 PM Reevaluation with update and discussion. After initial assessment and treatment, an updated evaluation reveals no additional complaints, findings discussed with patient and family members, all questions were answered. Lindsey Murillo     Labs Review Labs Reviewed  BASIC METABOLIC PANEL - Abnormal; Notable for the following:    Sodium 133 (*)    Chloride 96 (*)    Glucose, Bld 412 (*)    All other components within normal limits  URINALYSIS, ROUTINE W REFLEX MICROSCOPIC (NOT AT [facility]) - Abnormal; Notable for the following:    Glucose, UA >1000 (*)    Ketones, ur 15 (*)    All other components within normal limits  URINE MICROSCOPIC-ADD ON - Abnormal; Notable for the following:    Squamous Epithelial / LPF FEW (*)    Bacteria, UA FEW (*)    All other components within normal limits  CBG MONITORING, ED - Abnormal; Notable for the following:    Glucose-Capillary 374 (*)    All other components within normal limits  CBC WITH DIFFERENTIAL/PLATELET  CBG MONITORING, ED    Imaging Review No results found. I have personally reviewed and evaluated these images and lab results as part of my medical decision-making.   EKG Interpretation None      MDM   Final diagnoses:  Hyperglycemia  Noncompliance    Hyperglycemia secondary to noncompliance. Doubt serous bacterial infection, metabolic instability or impending vascular collapse. Patient will be started on insulin 70/30, but will require adjustment based on her response. She will need close follow-up with her PCP.  Nursing Notes Reviewed/ Care Coordinated Applicable Imaging Reviewed Interpretation of Laboratory Data incorporated into ED treatment  The patient appears reasonably screened and/or stabilized for discharge and I doubt any other medical condition or other [facility] requiring further screening, evaluation, or treatment in the ED at this time prior to discharge.  Plan: Home Medications-  Insulin 70/30; Home Treatments- rest, fluids; return here if the recommended treatment, does not improve the symptoms; Recommended follow up- PCP 3 days     Daleen Bo, MD 01/07/15 [phone]

## 2015-01-07 NOTE — ED Notes (Signed)
[facility].  Hemoglobin A1C was 19.  Pt has a history of diabetes type 1.  Pt has not been on insulin for over one year.  Pt says she has not been able to financially get medications.  Current glucose 374.  Pt is denying any symptoms.

## 2015-01-07 NOTE — ED Notes (Signed)
cbg in triage 374, pt stated that she just ate as well

## 2015-01-07 NOTE — Discharge Instructions (Signed)

## 2015-05-09 ENCOUNTER — Encounter: Payer: Self-pay | Admitting: Nurse Practitioner

## 2015-05-09 ENCOUNTER — Ambulatory Visit (INDEPENDENT_AMBULATORY_CARE_PROVIDER_SITE_OTHER): Payer: Self-pay | Admitting: Nurse Practitioner

## 2015-05-09 VITALS — BP 104/72 | Ht 67.0 in | Wt 105.2 lb

## 2015-05-09 DIAGNOSIS — E109 Type 1 diabetes mellitus without complications: Secondary | ICD-10-CM

## 2015-05-09 LAB — POCT GLYCOSYLATED HEMOGLOBIN (HGB A1C): HEMOGLOBIN A1C: 16.2

## 2015-05-09 MED ORDER — INSULIN REGULAR HUMAN 100 UNIT/ML IJ SOLN: INTRAMUSCULAR | Status: DC

## 2015-05-09 MED ORDER — INSULIN NPH (HUMAN) (ISOPHANE) 100 UNIT/ML ~~LOC~~ SUSP: SUBCUTANEOUS | Status: DC

## 2015-05-12 ENCOUNTER — Encounter: Payer: Self-pay | Admitting: Nurse Practitioner

## 2015-05-12 DIAGNOSIS — E1065 Type 1 diabetes mellitus with hyperglycemia: Secondary | ICD-10-CM | POA: Insufficient documentation

## 2015-05-12 DIAGNOSIS — E109 Type 1 diabetes mellitus without complications: Secondary | ICD-10-CM | POA: Insufficient documentation

## 2015-05-12 NOTE — Progress Notes (Signed)
Subjective:  Presents with her mother for diabetes check up. Is out of her insulin. Was being seen by [facility]. Still does not have insurance but hopefully this will change at the first of the year. According to patient, FBS at home 160-170 on average. Fatigue. Weight loss. Was seeing Dr. Tobe Sos at one point.   Objective:   BP 104/72 mmHg  Ht 5\' 7"  (1.702 m)  Wt 105 lb 4 oz (47.741 kg)  BMI 16.48 kg/m2 NAD. Alert, oriented. Lungs clear. Heart RRR. Very thin.  Results for orders placed or performed in visit on 05/09/15  POCT HgB A1C  Result Value Ref Range   Hemoglobin A1C 16.2     Assessment:  Problem List Items Addressed This Visit      Endocrine   Type 1 diabetes mellitus not at goal [facility]) - Primary   Relevant Medications   insulin NPH Human (HUMULIN N,NOVOLIN N) 100 UNIT/ML injection   insulin regular (NOVOLIN R,HUMULIN R) 100 units/mL injection   Other Relevant Orders   POCT HgB A1C (Completed)     Plan:  Meds ordered this encounter  Medications  . insulin NPH Human (HUMULIN N,NOVOLIN N) 100 UNIT/ML injection    Sig: Inject 30 units subcu in am and 10 units at hs    Dispense:  10 mL    Refill:  2    Order Specific Question:  Supervising Provider    Answer:  Mikey Kirschner [[id number]]  . insulin regular (NOVOLIN R,HUMULIN R) 100 units/mL injection    Sig: Inject up to 15 units subcu per day QID based on sliding scale    Dispense:  10 mL    Refill:  11    Order Specific Question:  Supervising Provider    Answer:  Mikey Kirschner [[id number]]   Stop 70/30. Discussed difference in insulins and dosage. Given sliding scale for meals and bedtime per [facility] guidelines. Discussed risk associated with BS including DKA; seek help immediately if any problems. Urgent referral to Dr. Dorris Fetch for evaluation. Start insulin immediately. Call back sooner if needed.

## 2015-05-13 ENCOUNTER — Encounter: Payer: Self-pay | Admitting: Family Medicine

## 2015-06-03 ENCOUNTER — Encounter: Payer: Self-pay | Admitting: "Endocrinology

## 2015-06-03 ENCOUNTER — Ambulatory Visit (INDEPENDENT_AMBULATORY_CARE_PROVIDER_SITE_OTHER): Payer: BLUE CROSS/BLUE SHIELD | Admitting: "Endocrinology

## 2015-06-03 VITALS — BP 107/74 | HR 120 | Ht 67.0 in | Wt 100.0 lb

## 2015-06-03 DIAGNOSIS — E1065 Type 1 diabetes mellitus with hyperglycemia: Secondary | ICD-10-CM | POA: Diagnosis not present

## 2015-06-03 DIAGNOSIS — E108 Type 1 diabetes mellitus with unspecified complications: Secondary | ICD-10-CM

## 2015-06-03 DIAGNOSIS — IMO0002 Reserved for concepts with insufficient information to code with codable children: Secondary | ICD-10-CM

## 2015-06-03 MED ORDER — ACCU-CHEK AVIVA DEVI: Status: DC

## 2015-06-03 MED ORDER — GLUCOSE BLOOD VI STRP: ORAL_STRIP | Status: DC

## 2015-06-03 NOTE — Progress Notes (Signed)
Subjective:    Patient ID: Lindsey Murillo, female    DOB: May 10, 1996. Patient is being seen in consultation for management of diabetes requested by  Mickie Hillier, MD  Past Medical History  Diagnosis Date  . Type 1 diabetes mellitus in patient age 21-19 years with HbA1C goal below 7.5   . Acanthosis nigricans, acquired   . Overweight for pediatric patient   . Hypoglycemia associated with diabetes ([place])   . Goiter   . Tachycardia   . Diabetic autonomic neuropathy ([place])   . Diabetic peripheral neuropathy ([place])   . Thyroiditis, autoimmune   . Asthma   . Environmental allergies    History reviewed. No pertinent past surgical history. Social History   Social History  . Marital Status: Single    Spouse Name: N/A  . Number of Children: N/A  . Years of Education: N/A   Social History Main Topics  . Smoking status: Never Smoker   . Smokeless tobacco: Never Used  . Alcohol Use: No  . Drug Use: No  . Sexual Activity: No   Other Topics Concern  . None   Social History Narrative   Outpatient Encounter Prescriptions as of 06/03/2015  Medication Sig  . glucagon 1 MG injection Follow package directions for low blood sugar.  . ibuprofen (ADVIL,MOTRIN) 200 MG tablet Take 400 mg by mouth every 6 (six) hours as needed for moderate pain.  . [DISCONTINUED] insulin NPH Human (HUMULIN N,NOVOLIN N) 100 UNIT/ML injection Inject 30 units subcu in am and 10 units at hs (Patient taking differently: 15 Units 2 times daily at 12 noon and 4 pm. Inject 30 units subcu in am and 10 units at hs)  . [DISCONTINUED] insulin regular (NOVOLIN R,HUMULIN R) 100 units/mL injection Inject up to 15 units subcu per day QID based on sliding scale  . Blood Glucose Monitoring Suppl (ACCU-CHEK AVIVA) device Use as instructed  . glucose blood (ACCU-CHEK AVIVA) test strip Use to test 4 times a day   No facility-administered encounter medications on file as of 06/03/2015.   ALLERGIES: No Known  Allergies VACCINATION STATUS: Immunization History  Administered Date(s) Administered  . Influenza,inj,Quad PF,36+ Mos 03/13/2013, 02/25/2014    Diabetes She presents for her initial diabetic visit. She has type 1 diabetes mellitus. Onset time: She was diagnosed at age 30 years. Her disease course has been worsening. There are no hypoglycemic associated symptoms. Pertinent negatives for hypoglycemia include no confusion, headaches, pallor or seizures. Associated symptoms include blurred vision, fatigue, foot paresthesias, polydipsia, polyphagia, polyuria and weight loss. Pertinent negatives for diabetes include no chest pain. There are no hypoglycemic complications. Symptoms are worsening. Diabetic complications include peripheral neuropathy. Risk factors for coronary artery disease include diabetes mellitus and sedentary lifestyle. Current diabetic treatment includes insulin injections (She is using Humulin 15 units at night and 0-15 units on sliding scale). Compliance with diabetes treatment: She has questionable compliance given her A1c being greater than 14% at least for the last 3 years, most recent one being 16.2% Her weight is decreasing steadily. She is following a generally unhealthy diet. When asked about meal planning, she reported none. She has not had a previous visit with a dietitian. She never participates in exercise. Home blood sugar record trend: She did not bring any meter nor logs to review. She admits to not monitoring regularly.     Review of Systems  Constitutional: Positive for weight loss, fatigue and unexpected weight change. Negative for fever and chills.  HENT:  Negative for trouble swallowing and voice change.   Eyes: Positive for blurred vision. Negative for visual disturbance.  Respiratory: Negative for cough, shortness of breath and wheezing.   Cardiovascular: Negative for chest pain, palpitations and leg swelling.  Gastrointestinal: Negative for nausea, vomiting and  diarrhea.  Endocrine: Positive for polydipsia, polyphagia and polyuria. Negative for cold intolerance and heat intolerance.  Musculoskeletal: Negative for myalgias and arthralgias.  Skin: Negative for color change, pallor, rash and wound.  Neurological: Negative for seizures and headaches.  Psychiatric/Behavioral: Negative for suicidal ideas and confusion.    Objective:    BP 107/74 mmHg  Pulse 120  Ht 5\' 7"  (1.702 m)  Wt 100 lb (45.36 kg)  BMI 15.66 kg/m2  SpO2 97%  Wt Readings from Last 3 Encounters:  06/03/15 100 lb (45.36 kg)  05/09/15 105 lb 4 oz (47.741 kg)  01/07/15 109 lb (49.442 kg)    Physical Exam  Constitutional: She is oriented to person, place, and time.  She is very light build with history of recent weight loss.  HENT:  Head: Normocephalic and atraumatic.  Eyes: EOM are normal.  Neck: Normal range of motion. Neck supple. No tracheal deviation present. No thyromegaly present.  Cardiovascular: Normal rate and regular rhythm.   Pulmonary/Chest: Effort normal and breath sounds normal.  Abdominal: Soft. Bowel sounds are normal. There is no tenderness. There is no guarding.  Musculoskeletal: She exhibits no edema.  Cachectic looking with diffuse loss of skeletal muscles.  Neurological: She is alert and oriented to person, place, and time. She has normal reflexes. No cranial nerve deficit. Coordination normal.  Skin: Skin is warm and dry. No rash noted. No erythema. No pallor.  Poor skin turgor with signs of malnutrition.  Psychiatric:  She has reluctant affect.     CMP ( most recent) CMP     Component Value Date/Time   NA 133* 01/07/2015 1558   K 3.9 01/07/2015 1558   CL 96* 01/07/2015 1558   CO2 29 01/07/2015 1558   GLUCOSE 412* 01/07/2015 1558   BUN 14 01/07/2015 1558   CREATININE 0.56 01/07/2015 1558   CREATININE 0.66 03/13/2013 1725   CALCIUM 9.0 01/07/2015 1558   PROT 7.5 03/13/2013 1725   ALBUMIN 4.4 03/13/2013 1725   AST 43* 03/13/2013 1725    ALT 29 03/13/2013 1725   ALKPHOS 137* 03/13/2013 1725   BILITOT 0.3 03/13/2013 1725   GFRNONAA >60 01/07/2015 1558   GFRAA >60 01/07/2015 1558     Diabetic Labs (most recent): Lab Results  Component Value Date   HGBA1C 16.2 05/09/2015   HGBA1C >14.0 02/25/2014   HGBA1C >14.0 03/13/2013     Lipid Panel ( most recent) Lipid Panel     Component Value Date/Time   CHOL 231* 11/28/2012 0940   TRIG 234* 11/28/2012 0940   HDL 64 11/28/2012 0940   CHOLHDL 3.6 11/28/2012 0940   VLDL 47* 11/28/2012 0940   LDLCALC 120* 11/28/2012 0940       Assessment & Plan:   1. Type I diabetes mellitus with complication, uncontrolled ([place]) complicated by neuropathy, non-compliance.  - Patient has currently uncontrolled symptomatic type 1 DM since  20 years of age,  with most recent A1c of 16.2 %.  She came with no meter nor logs and she admits she does not monitor BG regularly. She denies hx of recent DKA , although her a1c has stayed above 14 % for the last 3 years at least. -Her mother is with her and  offers to help, pt with minimal insight about  The high risk she is running for complications of diabetes.  - she has last seen Dr. Tobe Sos in October , 2015. -she has documented non-compliance to medical therapy and follow up. -There seemed some confusion about the type of her diabetes, but for management purposes she is classified as type 1 per Dr. Loren Racer notes.  -Her diabetes is complicated by neuropathy and patient remains at a high risk for more acute and chronic complications of diabetes which include CAD, CVA, CKD, retinopathy, and neuropathy. These are all discussed in detail with the patient.  - I have counseled the patient on diet management . She has to include more starch / complex carbs to her diet. - I encouraged the patient to switch to  unprocessed or minimally processed complex starch and increased protein intake (animal or plant source), fruits, and vegetables.  - Patient is  advised to stick to a routine mealtimes to eat 3 meals  a day and  With  snacks  If necessary to correct hypoglycemia).  - The patient will be scheduled with Jearld Fenton, RDN, CDE for individualized DM education.  - I have approached patient with the following individualized plan to manage diabetes and patient agrees:   - I  will  D/C NPH and regular insulin. I will  initiate basal insulin Toujeo 15 units QHS, and prandial insulin Apidra 5 units TIDAC for pre-meal BG readings of 90-150mg /dl, plus patient specific correction dose for unexpected hyperglycemia above 150mg /dl, associated with strict monitoring of glucose  AC and HS. I gave her samples of these products, her insurance covers Lantus and Novolog instead, will prescribe these alternatives when she returns ina  Week. - Patient is warned not to take insulin without proper monitoring per orders. -Adjustment parameters are given for hypo and hyperglycemia in writing. -Patient is encouraged to call clinic for blood glucose levels less than 70 or above 300 mg /dl. -Patient is not a candidate for  MTF, incretin therapy. Insulin is the exclusive option she has to stop catabolic state she is on.  - Patient specific target  A1c;  LDL, HDL, Triglycerides, and  Waist Circumference were discussed in detail.  2) BP/HTN: Controlled.  3) Lipids/HPL:  Uncontrolled, will be considered for  statins. 4)  Weight/Diet: she is malnourished  Due to severe insulin deficiency. CDE Consult will be initiated , exercise, and detailed carbohydrates information provided.  5) Chronic Care/Health Maintenance:  -Patient is  encouraged to continue to follow up with Ophthalmology, Podiatrist at least yearly or according to recommendations, and advised to  stay away from smoking. I have recommended yearly flu vaccine and pneumonia vaccination at least every 5 years;  and  sleep for at least 7 hours a day. -she is advised to postpone any plan for pregnancy until she  achieves control of diabetes to below 7% a1c. - 60 minutes of time was spent on the care of this patient , 50% of which was applied for counseling on diabetes complications and their preventions.  - Patient to bring meter and  blood glucose logs during their next visit.   - I advised patient to maintain close follow up with Mickie Hillier, MD for primary care needs.  Follow up plan: - Return in about 1 week (around 06/10/2015) for diabetes, follow up with meter and logs- no labs.  Glade Lloyd, MD Phone: [phone]  Fax: [phone]   06/03/2015, 11:57 AM

## 2015-06-12 ENCOUNTER — Encounter: Payer: Self-pay | Admitting: "Endocrinology

## 2015-06-12 ENCOUNTER — Ambulatory Visit (INDEPENDENT_AMBULATORY_CARE_PROVIDER_SITE_OTHER): Payer: BLUE CROSS/BLUE SHIELD | Admitting: "Endocrinology

## 2015-06-12 VITALS — BP 128/87 | HR 100 | Ht 67.0 in | Wt 107.0 lb

## 2015-06-12 DIAGNOSIS — IMO0002 Reserved for concepts with insufficient information to code with codable children: Secondary | ICD-10-CM

## 2015-06-12 DIAGNOSIS — E063 Autoimmune thyroiditis: Secondary | ICD-10-CM

## 2015-06-12 DIAGNOSIS — E108 Type 1 diabetes mellitus with unspecified complications: Secondary | ICD-10-CM

## 2015-06-12 DIAGNOSIS — I10 Essential (primary) hypertension: Secondary | ICD-10-CM

## 2015-06-12 DIAGNOSIS — E1065 Type 1 diabetes mellitus with hyperglycemia: Secondary | ICD-10-CM | POA: Diagnosis not present

## 2015-06-12 MED ORDER — INSULIN GLARGINE 300 UNIT/ML ~~LOC~~ SOPN: 16.0000 [IU] | PEN_INJECTOR | Freq: Every day | SUBCUTANEOUS | Status: DC

## 2015-06-12 MED ORDER — INSULIN ASPART 100 UNIT/ML FLEXPEN: 8.0000 [IU] | PEN_INJECTOR | Freq: Three times a day (TID) | SUBCUTANEOUS | Status: DC

## 2015-06-12 NOTE — Progress Notes (Signed)
Subjective:    Patient ID: Lindsey Murillo, female    DOB: 1995/11/14. Patient is being seen in consultation for management of diabetes requested by  Mickie Hillier, MD  Past Medical History  Diagnosis Date  . Type 1 diabetes mellitus in patient age 21-19 years with HbA1C goal below 7.5   . Acanthosis nigricans, acquired   . Overweight for pediatric patient   . Hypoglycemia associated with diabetes ([place])   . Goiter   . Tachycardia   . Diabetic autonomic neuropathy ([place])   . Diabetic peripheral neuropathy ([place])   . Thyroiditis, autoimmune   . Asthma   . Environmental allergies    No past surgical history on file. Social History   Social History  . Marital Status: Single    Spouse Name: N/A  . Number of Children: N/A  . Years of Education: N/A   Social History Main Topics  . Smoking status: Never Smoker   . Smokeless tobacco: Never Used  . Alcohol Use: No  . Drug Use: No  . Sexual Activity: No   Other Topics Concern  . None   Social History Narrative   Outpatient Encounter Prescriptions as of 06/12/2015  Medication Sig  . glucagon 1 MG injection Follow package directions for low blood sugar.  . ibuprofen (ADVIL,MOTRIN) 200 MG tablet Take 400 mg by mouth every 6 (six) hours as needed for moderate pain.  Marland Kitchen insulin aspart (NOVOLOG FLEXPEN) 100 UNIT/ML FlexPen Inject 8-14 Units into the skin 3 (three) times daily with meals.  . Insulin Glargine (TOUJEO SOLOSTAR) 300 UNIT/ML SOPN Inject 16 Units into the skin at bedtime.  . [DISCONTINUED] Insulin Glargine (TOUJEO SOLOSTAR) 300 UNIT/ML SOPN Inject 16 Units into the skin at bedtime.  . [DISCONTINUED] insulin glulisine (APIDRA) 100 UNIT/ML injection Inject 8-14 Units into the skin 3 (three) times daily before meals.  . Blood Glucose Monitoring Suppl (ACCU-CHEK AVIVA) device Use as instructed  . glucose blood (ACCU-CHEK AVIVA) test strip Use to test 4 times a day   No facility-administered encounter medications on file as  of 06/12/2015.   ALLERGIES: No Known Allergies VACCINATION STATUS: Immunization History  Administered Date(s) Administered  . Influenza,inj,Quad PF,36+ Mos 03/13/2013, 02/25/2014    Diabetes She presents for her initial diabetic visit. She has type 1 diabetes mellitus. Onset time: She was diagnosed at age 41 years. Her disease course has been improving. There are no hypoglycemic associated symptoms. Pertinent negatives for hypoglycemia include no confusion, headaches, pallor or seizures. Associated symptoms include blurred vision, fatigue, foot paresthesias and weight loss. Pertinent negatives for diabetes include no chest pain, no polydipsia and no polyuria. There are no hypoglycemic complications. Symptoms are improving. Diabetic complications include peripheral neuropathy. Risk factors for coronary artery disease include diabetes mellitus and sedentary lifestyle. Current diabetic treatment includes insulin injections (She is using Humulin 15 units at night and 0-15 units on sliding scale). Compliance with diabetes treatment: She has questionable compliance given her A1c being greater than 14% at least for the last 3 years, most recent one being 16.2% Her weight is increasing steadily. She is following a generally unhealthy diet. When asked about meal planning, she reported none. She has not had a previous visit with a dietitian. She never participates in exercise. Her home blood glucose trend is decreasing steadily (She did not bring her meter , but logs are showing better blood glucose profile.). Her breakfast blood glucose range is generally 180-200 mg/dl. Her lunch blood glucose range is generally >200  mg/dl. Her dinner blood glucose range is generally >200 mg/dl. Her overall blood glucose range is >200 mg/dl.     Review of Systems  Constitutional: Positive for weight loss and fatigue. Negative for fever, chills and unexpected weight change.  HENT: Negative for trouble swallowing and voice  change.   Eyes: Positive for blurred vision. Negative for visual disturbance.  Respiratory: Negative for cough, shortness of breath and wheezing.   Cardiovascular: Negative for chest pain, palpitations and leg swelling.  Gastrointestinal: Negative for nausea, vomiting and diarrhea.  Endocrine: Negative for cold intolerance, heat intolerance, polydipsia and polyuria.  Musculoskeletal: Negative for myalgias and arthralgias.  Skin: Negative for color change, pallor, rash and wound.  Neurological: Negative for seizures and headaches.  Psychiatric/Behavioral: Negative for suicidal ideas and confusion.    Objective:    BP 128/87 mmHg  Pulse 100  Ht 5\' 7"  (1.702 m)  Wt 107 lb (48.535 kg)  BMI 16.75 kg/m2  SpO2 97%  Wt Readings from Last 3 Encounters:  06/12/15 107 lb (48.535 kg)  06/03/15 100 lb (45.36 kg)  05/09/15 105 lb 4 oz (47.741 kg)    Physical Exam  Constitutional: She is oriented to person, place, and time.  She has gained 7 pounds since last week with the improved glycemic profile while using insulin basal/bolus.     HENT:  Head: Normocephalic and atraumatic.  Eyes: EOM are normal.  Neck: Normal range of motion. Neck supple. No tracheal deviation present. No thyromegaly present.  Cardiovascular: Normal rate and regular rhythm.   Pulmonary/Chest: Effort normal and breath sounds normal.  Abdominal: Soft. Bowel sounds are normal. There is no tenderness. There is no guarding.  Musculoskeletal: She exhibits no edema.  Cachectic looking with diffuse loss of skeletal muscles.  Neurological: She is alert and oriented to person, place, and time. She has normal reflexes. No cranial nerve deficit. Coordination normal.  Skin: Skin is warm and dry. No rash noted. No erythema. No pallor.  Poor skin turgor with signs of malnutrition.  Psychiatric:  She has reluctant affect.     CMP     Component Value Date/Time   NA 133* 01/07/2015 1558   K 3.9 01/07/2015 1558   CL 96*  01/07/2015 1558   CO2 29 01/07/2015 1558   GLUCOSE 412* 01/07/2015 1558   BUN 14 01/07/2015 1558   CREATININE 0.56 01/07/2015 1558   CREATININE 0.66 03/13/2013 1725   CALCIUM 9.0 01/07/2015 1558   PROT 7.5 03/13/2013 1725   ALBUMIN 4.4 03/13/2013 1725   AST 43* 03/13/2013 1725   ALT 29 03/13/2013 1725   ALKPHOS 137* 03/13/2013 1725   BILITOT 0.3 03/13/2013 1725   GFRNONAA >60 01/07/2015 1558   GFRAA >60 01/07/2015 1558     Diabetic Labs (most recent): Lab Results  Component Value Date   HGBA1C 16.2 05/09/2015   HGBA1C >14.0 02/25/2014   HGBA1C >14.0 03/13/2013     Lipid Panel ( most recent) Lipid Panel     Component Value Date/Time   CHOL 231* 11/28/2012 0940   TRIG 234* 11/28/2012 0940   HDL 64 11/28/2012 0940   CHOLHDL 3.6 11/28/2012 0940   VLDL 47* 11/28/2012 0940   LDLCALC 120* 11/28/2012 0940       Assessment & Plan:   1. Type I diabetes mellitus with complication, uncontrolled ([place]) complicated by neuropathy, non-compliance.  - Patient has currently uncontrolled symptomatic type 1 DM since  20 years of age,  with most recent A1c of 16.2 %.  She came with no meter nor logs and she admits she does not monitor BG regularly. She denies hx of recent DKA , although her a1c has stayed above 14 % for the last 3 years at least. -Her mother is with her and offers to help, pt with minimal insight about  The high risk she is running for complications of diabetes.  -She came with much better blood glucose profile and gained 7 pounds since last visit. -she has documented non-compliance to medical therapy and follow up. -There seemed some confusion about the type of her diabetes, but for management purposes she is classified as type 1 per Dr. Loren Racer notes.  -Her diabetes is complicated by neuropathy and patient remains at a high risk for more acute and chronic complications of diabetes which include CAD, CVA, CKD, retinopathy, and neuropathy. These are all discussed in  detail with the patient.  - I have counseled the patient on diet management . She has to include more starch / complex carbs to her diet. - I encouraged the patient to switch to  unprocessed or minimally processed complex starch and increased protein intake (animal or plant source), fruits, and vegetables.  - Patient is advised to stick to a routine mealtimes to eat 3 meals  a day and  With  snacks  If necessary to correct hypoglycemia).  - The patient will be scheduled with Jearld Fenton, RDN, CDE for individualized DM education.  - I have approached patient with the following individualized plan to manage diabetes and patient agrees:   - I  will continue basal insulin Toujeo 16 units QHS, and prandial insulin Apidra (or NovoLog, one or the other)  8 units TIDAC for pre-meal BG readings of 90-150mg /dl, plus patient specific correction dose for unexpected hyperglycemia above 150mg /dl, associated with strict monitoring of glucose  AC and HS. - Patient is warned not to take insulin without proper monitoring per orders. -Adjustment parameters are given for hypo and hyperglycemia in writing. -Patient is encouraged to call clinic for blood glucose levels less than 70 or above 300 mg /dl. -Patient is not a candidate for  MTF, incretin therapy. Insulin is the exclusive option she has to stop catabolic state she is on.  - Patient specific target  A1c;  LDL, HDL, Triglycerides, and  Waist Circumference were discussed in detail.  2) BP/HTN: Controlled.  3) Lipids/HPL:  Uncontrolled, will be considered for  statins. 4)  Weight/Diet: She is making good progress, gained 7 pounds a good development for her. she is still malnourished  Due to severe insulin deficiency. CDE Consult is initiated , exercise, and detailed carbohydrates information provided.  5) Chronic Care/Health Maintenance:  -Patient is  encouraged to continue to follow up with Ophthalmology, Podiatrist at least yearly or according to  recommendations, and advised to  stay away from smoking. I have recommended yearly flu vaccine and pneumonia vaccination at least every 5 years;  and  sleep for at least 7 hours a day. -she is advised to postpone any plan for pregnancy until she achieves control of diabetes to below 7% a1c. - 25 minutes of time was spent on the care of this patient , 50% of which was applied for counseling on diabetes complications and their preventions.  - Patient to bring meter and  blood glucose logs during their next visit.   - I advised patient to maintain close follow up with Mickie Hillier, MD for primary care needs.  Follow up plan: - Return in about  9 weeks (around 08/14/2015) for diabetes, high blood pressure.  Glade Lloyd, MD Phone: [phone]  Fax: [phone]   06/12/2015, 11:18 AM

## 2015-06-20 ENCOUNTER — Other Ambulatory Visit: Payer: Self-pay

## 2015-06-20 MED ORDER — INSULIN ASPART 100 UNIT/ML FLEXPEN: 8.0000 [IU] | PEN_INJECTOR | Freq: Three times a day (TID) | SUBCUTANEOUS | Status: DC

## 2015-07-07 ENCOUNTER — Ambulatory Visit (INDEPENDENT_AMBULATORY_CARE_PROVIDER_SITE_OTHER): Payer: BLUE CROSS/BLUE SHIELD | Admitting: Orthopedic Surgery

## 2015-07-07 ENCOUNTER — Ambulatory Visit (INDEPENDENT_AMBULATORY_CARE_PROVIDER_SITE_OTHER): Payer: BLUE CROSS/BLUE SHIELD

## 2015-07-07 VITALS — BP 121/91 | Ht 67.0 in | Wt 107.0 lb

## 2015-07-07 DIAGNOSIS — M546 Pain in thoracic spine: Secondary | ICD-10-CM | POA: Diagnosis not present

## 2015-07-07 NOTE — Progress Notes (Signed)
Patient ID: TALULLAH TORRY, female   DOB: 1995-09-24, 20 y.o.   MRN: [id number]  Chief Complaint  Patient presents with  . Scoliosis    EVAL SCOLIOSIS- COMPLAINS OF MID TO LOWER BACK PAIN    HPI NATILEY RENE is a 20 y.o. female.  Presents with normal birth history other than  A few weeks premature   Back pain is dull achy activity related no trauma posterior thoracic or lumbar spine present for several years    Review of Systems Review of Systems   normal bowel function  Normal bladder function    Past Medical History  Diagnosis Date  . Type 1 diabetes mellitus in patient age 46-19 years with HbA1C goal below 7.5   . Acanthosis nigricans, acquired   . Overweight for pediatric patient   . Hypoglycemia associated with diabetes ([place])   . Goiter   . Tachycardia   . Diabetic autonomic neuropathy ([place])   . Diabetic peripheral neuropathy ([place])   . Thyroiditis, autoimmune   . Asthma   . Environmental allergies     Reports no previous surgery   Family History  Problem Relation Age of Onset  . Diabetes Mother     Type II DM  . Thyroid disease Mother   . Diabetes Maternal Grandmother     Type II DM  . Diabetes Cousin     Type II DM    Social History Social History  Substance Use Topics  . Smoking status: Never Smoker   . Smokeless tobacco: Never Used  . Alcohol Use: No    No Known Allergies  Current Outpatient Prescriptions  Medication Sig Dispense Refill  . Blood Glucose Monitoring Suppl (ACCU-CHEK AVIVA) device Use as instructed 1 each 0  . glucagon 1 MG injection Follow package directions for low blood sugar. 2 each 3  . glucose blood (ACCU-CHEK AVIVA) test strip Use to test 4 times a day 150 each 3  . ibuprofen (ADVIL,MOTRIN) 200 MG tablet Take 400 mg by mouth every 6 (six) hours as needed for moderate pain.    Marland Kitchen insulin aspart (NOVOLOG FLEXPEN) 100 UNIT/ML FlexPen Inject 8-14 Units into the skin 3 (three) times daily with meals. 15 mL 2  . Insulin  Glargine (TOUJEO SOLOSTAR) 300 UNIT/ML SOPN Inject 16 Units into the skin at bedtime. 3 pen 2   No current facility-administered medications for this visit.       Physical Exam Physical Exam Blood pressure 121/91, height 5\' 7"  (1.702 m), weight 107 lb (48.535 kg), last menstrual period 07/01/2015. Appearance, there are no abnormalities in terms of appearance the patient was well-developed and well-nourished. The grooming and hygiene were normal. body habitus ectomorphic  Mental status orientation, there was normal alertness and orientation Mood pleasant Ambulatory status normal with no assistive devices  Examination of the  spine InspectiNo tenderness but truncal asymmetry with left hip lower than right Adams position no major curve Lumbar spine is stable   Lower extremity muscle strength and muscle tone normal   Skin is warm dry and intact without laceration or ulceration or erythema or congenital abnormality in the lumbar spine and the lower extremities have normal skin   Reflexes were 2+ toes are downgoing no clonus    Vascula exam was nrmal    Data Reviewed X-rays ordered Truncal assymetry < 10    Assessment  Truncal asymmetry with thoracic back pain   Plan  Physical therapy for 6 weeks follow-up as needed

## 2015-07-07 NOTE — Patient Instructions (Signed)
CALL [facility]ER[facility]PT TO SCHEDULE THERAPY VISITS [phone]

## 2015-07-16 ENCOUNTER — Ambulatory Visit (HOSPITAL_COMMUNITY): Payer: BLUE CROSS/BLUE SHIELD | Attending: Orthopedic Surgery | Admitting: Physical Therapy

## 2015-07-16 DIAGNOSIS — M6289 Other specified disorders of muscle: Secondary | ICD-10-CM | POA: Diagnosis present

## 2015-07-16 DIAGNOSIS — M2560 Stiffness of unspecified joint, not elsewhere classified: Secondary | ICD-10-CM | POA: Diagnosis present

## 2015-07-16 DIAGNOSIS — M546 Pain in thoracic spine: Secondary | ICD-10-CM | POA: Diagnosis not present

## 2015-07-16 DIAGNOSIS — R293 Abnormal posture: Secondary | ICD-10-CM | POA: Diagnosis present

## 2015-07-16 DIAGNOSIS — M6281 Muscle weakness (generalized): Secondary | ICD-10-CM | POA: Insufficient documentation

## 2015-07-16 DIAGNOSIS — M2170 Unequal limb length (acquired), unspecified site: Secondary | ICD-10-CM

## 2015-07-16 NOTE — Patient Instructions (Signed)
  SCAPULAR RETRACTIONS  Draw your shoulder blades back and down. 2 sets of 10 reps. Do this 2x a day.     STRAIGHT LEG RAISE - SLR  While lying or sitting, raise up your leg with a straight knee.  Keep the opposite knee bent with the foot planted to the ground. Perform 3 sets of 10 reps. 2x a day    Prone Leg Extension  Laying flat on your stomach with hands stacked, forehead flat on your hands. Keep abdominals tight and raise your leg straight up and back so your thigh clears the ground. Slowly lower down and repeat this motion trying to avoid excessive rocking at your hips.  2 sets of 10 reps. Do this 2x a day.    Single Leg Bridge  On your back: pull one knee up to your chest. Using the other leg, push with the heel and lift your rear off the table. 2x10 reps. Perform 2x a day. No wobbling and keep that knee in to your chest!   Seated Hamstring stretch  Sit with one leg in front (knee straight). Sit up tall and pivot at the hips as you reach towards the outstretched foot (don't bend your back). Hold that position. Perform 3 times and hold 30 sec. Do this every day.

## 2015-07-16 NOTE — Therapy (Signed)
[place], [place], [address] Phone: [phone]   Fax:  [phone]  Physical Therapy Evaluation  Patient Details  Name: RINIYAH BRENNEKE MRN: [id number] Date of Birth: 25-Nov-1995 Referring Provider: Arther Abbott, MD  Encounter Date: 07/16/2015      PT End of Session - 07/16/15 1613    Visit Number 1   Number of Visits 12   Date for PT Re-Evaluation 08/13/15   Authorization Type BCBS (30 visit limited combined with OT and chiro)   Authorization Time Period 07/16/15 to 08/27/15   Authorization - Visit Number 1   Authorization - Number of Visits 30   PT Start Time 1518   PT Stop Time 1602   PT Time Calculation (min) 44 min   Activity Tolerance Patient tolerated treatment well   Behavior During Therapy [facility] for tasks assessed/performed      Past Medical History  Diagnosis Date  . Type 1 diabetes mellitus in patient age 21-19 years with HbA1C goal below 7.5   . Acanthosis nigricans, acquired   . Overweight for pediatric patient   . Hypoglycemia associated with diabetes ([place])   . Goiter   . Tachycardia   . Diabetic autonomic neuropathy ([place])   . Diabetic peripheral neuropathy ([place])   . Thyroiditis, autoimmune   . Asthma   . Environmental allergies     No past surgical history on file.  There were no vitals filed for this visit.  Visit Diagnosis:  Midline thoracic back pain - Plan: PT plan of care cert/re-cert  Muscle weakness - Plan: PT plan of care cert/re-cert  Muscle fatigue - Plan: PT plan of care cert/re-cert  Poor posture - Plan: PT plan of care cert/re-cert  Leg length discrepancy - Plan: PT plan of care cert/re-cert  Joint stiffness of spine - Plan: PT plan of care cert/re-cert      Subjective Assessment - 07/16/15 1519    Subjective Pt states her back pain started ~21yrs ago during sports season. Pain was insidious and she continued playing without treatment. Her grandma encouraged  her to go to the Dr and she is here for treatment.    Patient is accompained by: Family member   Pertinent History DM1 and asthma (allergy related) with inhailer   How long can you sit comfortably? no limitations   How long can you stand comfortably? no limitations    How long can you walk comfortably? no limitations    Patient Stated Goals decrease back pain    Currently in Pain? Yes   Pain Score 2    Pain Location Back   Pain Orientation Mid   Pain Descriptors / Indicators Sharp   Pain Type Chronic pain   Pain Onset More than a month ago   Pain Frequency Constant   Aggravating Factors  unsure, grandma notes that her back hurts after working all day.   Pain Relieving Factors unsure   Effect of Pain on Daily Activities Keeps pt awake at night            [facility] PT Assessment - 07/16/15 0001    Assessment   Medical Diagnosis thoracic spine pain    Referring Provider Arther Abbott, MD   Onset Date/Surgical Date --  ~2 years ago   Precautions   Precautions None   Balance Screen   Has the patient fallen in the past 6 months No   Has the patient had a decrease in activity level because of  a fear of falling?  No   Is the patient reluctant to leave their home because of a fear of falling?  No   Prior Function   Vocation Part time employment   Vocation Requirements standing ~9 hours   Leisure enjoys playing basketball    Observation/Other Assessments   Focus on Therapeutic Outcomes (FOTO)  37% limited   AROM   Cervical Flexion WFL   Cervical Extension WFL   Cervical - Right Side Bend EFL   Cervical - Left Side Bend EFL   Cervical - Right Rotation EFL   Cervical - Left Rotation WFL   Lumbar Flexion 30-40% limited  end range pain   Lumbar Extension WFL  end range pain   Lumbar - Right Side Bend rotation noted    Lumbar - Left Side Bend rotation noted    Thoracic Flexion 35% limited   Thoracic Extension 30% limited    Thoracic - Right Side Bend [facility]   Thoracic - Left  Side Bend [facility]   Thoracic - Right Rotation [facility]   Thoracic - Left Rotation [facility]   Strength   Overall Strength Comments Core strength endurance: flexors 35 sec (trunk 45deg) Extensors 3 minutes    Right Hip Extension 4-/5   Right Hip ABduction 3/5   Left Hip Extension 3+/5   Left Hip ABduction 3-/5   Palpation   Spinal mobility sacrum and lumbar region 80% hypomobile; thoracic spine 40% hypomobile   SI assessment  R anteriorly rotated   Palpation comment paraspinals Lower thx/upper Lx slight spasm   Ambulation/Gait   Gait Comments Decreased step length, (+) trendelenburg, decreased arm swing                            PT Education - 07/16/15 1612    Education provided Yes   Education Details prognosis; HEP; plan of care; LLD    Person(s) Educated Patient;Other (comment)  family member    Methods Explanation;Demonstration;Handout   Comprehension Verbalized understanding;Returned demonstration;Need further instruction          PT Short Term Goals - 07/16/15 1628    PT SHORT TERM GOAL #1   Title Patient will demosntrate presently limited motions in Lumbar and Thoracic spines to be [facility] in order to assist in improving overall posture and mobility, and to reduce pain    Time 3   Period Weeks   Status New   PT SHORT TERM GOAL #2   Title Patient to demonstrate  improved gait with improved mobility of rotation of spine and pelvis during gait, improved posture during gait, no signs of LLD, and midback pain 0/10 during gait in order to enhance overall pain free function    Time 3   Period Weeks   Status New   PT SHORT TERM GOAL #3   Title Patient to demonstrate improved posture at least 75% of the time with improvements in spinal curvature in order to assist in reducing pain and improving general function    Time 3   Period Weeks   Status New   PT SHORT TERM GOAL #4   Title Patient to be independent in correctly and consistently performing appripriate HEP, to be  updated PRN    Time 3   Period Weeks   Status New           PT Long Term Goals - 07/16/15 1631    PT LONG TERM GOAL #1   Title  Patient will demonstrate strength at least 4/5 in all tested muscle groups and will demonstrate an improvement of at least 75 seconds in endurance of trunk flexors and extensors in order to enhance function and assist in reducing pain    Time 6   Period Weeks   Status New   PT LONG TERM GOAL #2   Title Patient to demonstrate no LLD for at least the past 3 weeks, and will be in dependent in self-correction techniques in order to assist in management of condition and general pain relief    Time 6   Period Weeks   Status New   PT LONG TERM GOAL #3   Title Patient to report she has been able to complete at least 1/2 shift at work with thoracic pain 0/10, good posture throughout, in order to assist in enhancing and promoting painfree function in community    Time 6   Period Weeks   Status New   PT LONG TERM GOAL #4   Title Patient to report she has been able to play at least 5-10 minutes of basketball at least 3 times per week with thoracic spine pain no more than 2/10 in order to assist in returning to PLOF and promoting overall improved health habits    Time 6   Period Weeks   Status New               Plan - 07/16/15 1615    Clinical Impression Statement Patient arrives with family member; she reports that she started having pain in her midback about two years ago, also that in highschool she was very active with quite a bit of muscle mass hwoever once she finished highschool stopped playing sports and being active, lost qutite a bit of weight and muscle mass. Patient maintains poor psoture throughout session, demonstratse significant thin-ness with poor muscle mass in general ; she reports that her pain has been keeping her up at night and can prevent her from sleeping at times as well, can also feel deep at times. Patient is also diabetic. Upon  examination, patient  does demonstrate poor posture with very flat spinal curves in lumbar/thoracic/cervical spines, tightness with PAs to sacrum/lumbar/thoracic spinse, some paraspinal muscle tightness, significantl core weakness and reduced endurance with LLD present, and gait deviations. When considering all functional limitations found, it is very likely that patient's mid back pain is related to tightness of bones in spine, core weakness,/muscle weakness, LLD, some spine stiffness, and poor postrue/loss of spinal curves, in addition to a job in [employer] she is on her feet for  extended periods, all of which may globally be contributing to her mid back pain. PT was a bit concerned regarding her general presentation and complaints of deep pain keeping her up at night, and will continue to monitor carefully. At this piont recommend skilled PT services to address functional impairments and to assist in reaching optimal level of function.    Pt will benefit from skilled therapeutic intervention in order to improve on the following deficits Abnormal gait;Hypomobility;Decreased endurance;Decreased strength;Pain;Difficulty walking;Increased muscle spasms;Postural dysfunction   Rehab Potential Good   PT Frequency 2x / week   PT Duration 6 weeks   PT Treatment/Interventions ADLs/Self Care Home Management;Electrical Stimulation;Moist Heat;Gait training;Functional mobility training;Therapeutic activities;Therapeutic exercise;Neuromuscular re-education;Patient/family education;Manual techniques;Taping;Cryotherapy   PT Next Visit Plan review HEP and goals; posture training, core strength/endurance, address LLD    PT Home Exercise Plan given, please review 2nd session    Consulted and Agree  with Plan of Care Patient         Problem List Patient Active Problem List   Diagnosis Date Noted  . Type I diabetes mellitus with complication, uncontrolled ([place]) 02/26/2014  . Noncompliance 02/26/2014  . Essential  hypertension, benign 11/30/2012  . Acanthosis nigricans, acquired   . Overweight for pediatric patient   . Hypoglycemia associated with diabetes ([place])   . Goiter   . Tachycardia   . Diabetic autonomic neuropathy ([place])   . Diabetic peripheral neuropathy ([place])   . Thyroiditis, autoimmune   . Asthma   . Environmental allergies    Deniece Ree PT, DPT [place] [address] [place], [place], [address] Phone: [phone]   Fax:  [phone]  Name: SHANIKWA CANIZALEZ MRN: [id number] Date of Birth: 07-Aug-1995

## 2015-07-18 ENCOUNTER — Ambulatory Visit (HOSPITAL_COMMUNITY): Payer: BLUE CROSS/BLUE SHIELD

## 2015-07-18 DIAGNOSIS — M546 Pain in thoracic spine: Secondary | ICD-10-CM | POA: Diagnosis not present

## 2015-07-18 DIAGNOSIS — M6289 Other specified disorders of muscle: Secondary | ICD-10-CM

## 2015-07-18 DIAGNOSIS — M2170 Unequal limb length (acquired), unspecified site: Secondary | ICD-10-CM

## 2015-07-18 DIAGNOSIS — M6281 Muscle weakness (generalized): Secondary | ICD-10-CM

## 2015-07-18 DIAGNOSIS — M2560 Stiffness of unspecified joint, not elsewhere classified: Secondary | ICD-10-CM

## 2015-07-18 DIAGNOSIS — R293 Abnormal posture: Secondary | ICD-10-CM

## 2015-07-18 NOTE — Therapy (Signed)
[place], [place], [address] Phone: [phone]   Fax:  [phone]  Physical Therapy Treatment  Patient Details  Name: Lindsey Murillo MRN: [id number] Date of Birth: [birth year] Referring Provider: Arther Abbott, MD  Encounter Date: 07/18/2015      PT End of Session - 07/18/15 0859    Visit Number 2   Number of Visits 12   Date for PT Re-Evaluation 08/13/15   Authorization Type BCBS (30 visit limited combined with OT and chiro)   Authorization Time Period 07/16/15 to 08/27/15   Authorization - Visit Number 2   Authorization - Number of Visits 30   PT Start Time 0855   PT Stop Time 0933   PT Time Calculation (min) 38 min   Activity Tolerance Patient tolerated treatment well   Behavior During Therapy [facility] for tasks assessed/performed      Past Medical History  Diagnosis Date  . Type 1 diabetes mellitus in patient age 72-19 years with HbA1C goal below 7.5   . Acanthosis nigricans, acquired   . Overweight for pediatric patient   . Hypoglycemia associated with diabetes ([place])   . Goiter   . Tachycardia   . Diabetic autonomic neuropathy ([place])   . Diabetic peripheral neuropathy ([place])   . Thyroiditis, autoimmune   . Asthma   . Environmental allergies     No past surgical history on file.  There were no vitals filed for this visit.  Visit Diagnosis:  Midline thoracic back pain  Muscle weakness  Muscle fatigue  Poor posture  Leg length discrepancy  Joint stiffness of spine      Subjective Assessment - 07/18/15 0858    Subjective Pt stated she has attempted the HEP with no questions about the exercises.  Current pain scale 3/10   Pertinent History DM1 and asthma (allergy related) with inhailer   Patient Stated Goals decrease back pain    Currently in Pain? Yes   Pain Score 3    Pain Location Back   Pain Orientation Mid   Pain Descriptors / Indicators Tender   Pain Type Chronic pain    Pain Onset More than a month ago   Pain Frequency Constant   Aggravating Factors  unsure, grandma notes that her back hurts after working all day.   Pain Relieving Factors unsure   Effect of Pain on Daily Activities Keeps pt awake at night             [facility] Adult PT Treatment/Exercise - 07/18/15 0001    Exercises   Exercises Lumbar   Lumbar Exercises: Stretches   Active Hamstring Stretch 3 reps;30 seconds   Active Hamstring Stretch Limitations long sitting BLE   Lumbar Exercises: Standing   Scapular Retraction Both;10 reps;Theraband  GTB   Theraband Level (Scapular Retraction) Level 3 (Green)   Row Both;10 reps;Theraband   Theraband Level (Row) Level 3 (Green)   Shoulder Extension Both;10 reps;Theraband   Theraband Level (Shoulder Extension) Level 3 (Green)   Lumbar Exercises: Seated   Other Seated Lumbar Exercises scapular retraction 10x HEP   Other Seated Lumbar Exercises 3D Thoracic excursion 10x with UE movements   Lumbar Exercises: Supine   Bridge 10 reps   Bridge Limitations unilateral HEP   Straight Leg Raise 10 reps  HEP   Lumbar Exercises: Sidelying   Hip Abduction 10 reps   Hip Abduction Limitations HEP   Lumbar Exercises: Prone   Straight Leg Raise 10 reps  Straight Leg Raises Limitations HEP                  PT Short Term Goals - 07/16/15 1628    PT SHORT TERM GOAL #1   Title Patient will demosntrate presently limited motions in Lumbar and Thoracic spines to be [facility] in order to assist in improving overall posture and mobility, and to reduce pain    Time 3   Period Weeks   Status New   PT SHORT TERM GOAL #2   Title Patient to demonstrate  improved gait with improved mobility of rotation of spine and pelvis during gait, improved posture during gait, no signs of LLD, and midback pain 0/10 during gait in order to enhance overall pain free function    Time 3   Period Weeks   Status New   PT SHORT TERM GOAL #3   Title Patient to demonstrate  improved posture at least 75% of the time with improvements in spinal curvature in order to assist in reducing pain and improving general function    Time 3   Period Weeks   Status New   PT SHORT TERM GOAL #4   Title Patient to be independent in correctly and consistently performing appripriate HEP, to be updated PRN    Time 3   Period Weeks   Status New           PT Long Term Goals - 07/16/15 1631    PT LONG TERM GOAL #1   Title Patient will demonstrate strength at least 4/5 in all tested muscle groups and will demonstrate an improvement of at least 75 seconds in endurance of trunk flexors and extensors in order to enhance function and assist in reducing pain    Time 6   Period Weeks   Status New   PT LONG TERM GOAL #2   Title Patient to demonstrate no LLD for at least the past 3 weeks, and will be in dependent in self-correction techniques in order to assist in management of condition and general pain relief    Time 6   Period Weeks   Status New   PT LONG TERM GOAL #3   Title Patient to report she has been able to complete at least 1/2 shift at work with thoracic pain 0/10, good posture throughout, in order to assist in enhancing and promoting painfree function in community    Time 6   Period Weeks   Status New   PT LONG TERM GOAL #4   Title Patient to report she has been able to play at least 5-10 minutes of basketball at least 3 times per week with thoracic spine pain no more than 2/10 in order to assist in returning to PLOF and promoting overall improved health habits    Time 6   Period Weeks   Status New               Plan - 07/18/15 [id number]    Clinical Impression Statement Reviewed goals, compliance and proper technique with HEP and copy of evaluation given to pt.  Pt educated on importance of posture for pain control with verbal, tactile and visual cueing to improve posture.  Added 3D thoracic excursin to improve spinal mobiility.  Session focus  on improving core  and proximal musculature strengtheing as well as posture education and strengthening.  Pt required verbal and tactile cueing to improve form wtih postural exercises.  Checked SI alignment with no pain or leg length discrepency  noted today.  End of sesion pt. reports pain free.   Pt will benefit from skilled therapeutic intervention in order to improve on the following deficits Abnormal gait;Hypomobility;Decreased endurance;Decreased strength;Pain;Difficulty walking;Increased muscle spasms;Postural dysfunction   Rehab Potential Good   PT Frequency 2x / week   PT Duration 6 weeks   PT Treatment/Interventions ADLs/Self Care Home Management;Electrical Stimulation;Moist Heat;Gait training;Functional mobility training;Therapeutic activities;Therapeutic exercise;Neuromuscular re-education;Patient/family education;Manual techniques;Taping;Cryotherapy   PT Next Visit Plan Continue with current PT POC to improve posture training, core strengthen and endurance.  Check SI alignment for LLD        Problem List Patient Active Problem List   Diagnosis Date Noted  . Type I diabetes mellitus with complication, uncontrolled ([place]) 02/26/2014  . Noncompliance 02/26/2014  . Essential hypertension, benign 11/30/2012  . Acanthosis nigricans, acquired   . Overweight for pediatric patient   . Hypoglycemia associated with diabetes ([place])   . Goiter   . Tachycardia   . Diabetic autonomic neuropathy ([place])   . Diabetic peripheral neuropathy ([place])   . Thyroiditis, autoimmune   . Asthma   . Environmental allergies    Ihor Austin, [place]; [place]  Aldona Lento 07/18/2015, 11:28 AM  [place], [place], [address] Phone: [phone]   Fax:  [phone]  Name: Lindsey Murillo MRN: [id number] Date of Birth: 10-31-1995

## 2015-07-18 NOTE — Patient Instructions (Signed)
Abduction    Lift leg up toward ceiling. Return. Use ____ lbs on ankle. Repeat 10-20 times each leg. Do 1-2 sessions per day.  http://gt2.exer.us/386   Copyright  VHI. All rights reserved.

## 2015-07-22 ENCOUNTER — Telehealth (HOSPITAL_COMMUNITY): Payer: Self-pay | Admitting: Physical Therapy

## 2015-07-22 ENCOUNTER — Ambulatory Visit (HOSPITAL_COMMUNITY): Payer: BLUE CROSS/BLUE SHIELD | Admitting: Physical Therapy

## 2015-07-22 NOTE — Telephone Encounter (Signed)
Pt did not show for appointment.  Called and spoke to secretary stating she overslept and appointment was RS for another day. Teena Irani, PTA/CLT [phone]

## 2015-07-23 ENCOUNTER — Ambulatory Visit (HOSPITAL_COMMUNITY): Payer: BLUE CROSS/BLUE SHIELD

## 2015-07-23 DIAGNOSIS — M6281 Muscle weakness (generalized): Secondary | ICD-10-CM

## 2015-07-23 DIAGNOSIS — M2560 Stiffness of unspecified joint, not elsewhere classified: Secondary | ICD-10-CM

## 2015-07-23 DIAGNOSIS — M546 Pain in thoracic spine: Secondary | ICD-10-CM

## 2015-07-23 DIAGNOSIS — M6289 Other specified disorders of muscle: Secondary | ICD-10-CM

## 2015-07-23 DIAGNOSIS — M2170 Unequal limb length (acquired), unspecified site: Secondary | ICD-10-CM

## 2015-07-23 DIAGNOSIS — R293 Abnormal posture: Secondary | ICD-10-CM

## 2015-07-23 NOTE — Therapy (Signed)
[place], [place], [address] Phone: [phone]   Fax:  [phone]  Physical Therapy Treatment  Patient Details  Name: Lindsey Murillo MRN: [id number] Date of Birth: [birth year] Referring Provider: Arther Abbott, MD  Encounter Date: 07/23/2015      PT End of Session - 07/23/15 1449    Visit Number 3   Number of Visits 12   Date for PT Re-Evaluation 08/13/15   Authorization Type BCBS (30 visit limited combined with OT and chiro)   Authorization Time Period 07/16/15 to 08/27/15   Authorization - Visit Number 3   Authorization - Number of Visits 30   PT Start Time [id number]   PT Stop Time 1514   PT Time Calculation (min) 31 min   Activity Tolerance Patient tolerated treatment well   Behavior During Therapy [facility] for tasks assessed/performed      Past Medical History  Diagnosis Date  . Type 1 diabetes mellitus in patient age 20 years with HbA1C goal below 7.5   . Acanthosis nigricans, acquired   . Overweight for pediatric patient   . Hypoglycemia associated with diabetes ([place])   . Goiter   . Tachycardia   . Diabetic autonomic neuropathy ([place])   . Diabetic peripheral neuropathy ([place])   . Thyroiditis, autoimmune   . Asthma   . Environmental allergies     No past surgical history on file.  There were no vitals filed for this visit.  Visit Diagnosis:  Midline thoracic back pain  Muscle weakness  Muscle fatigue  Poor posture  Leg length discrepancy  Joint stiffness of spine      Subjective Assessment - 07/23/15 1445    Subjective Pt stated she moved yesterday and earlier today, current mid back pain scale 3/10.  Reports compliance with HEP 2x daily without questions   Pertinent History DM1 and asthma (allergy related) with inhailer   Patient Stated Goals decrease back pain    Currently in Pain? Yes   Pain Score 3    Pain Location Back   Pain Orientation Mid   Pain Descriptors / Indicators  Tender   Pain Type Chronic pain   Pain Onset More than a month ago   Pain Frequency Intermittent   Aggravating Factors  unsure, grandma notes that her back hurts after working all day   Pain Relieving Factors unsure   Effect of Pain on Daily Activities Keeps pt awake at night              [facility] Adult PT Treatment/Exercise - 07/23/15 0001    Lumbar Exercises: Standing   Functional Squats 15 reps   Forward Lunge 10 reps   Forward Lunge Limitations 6in step   Side Lunge 10 reps  6in step   Scapular Retraction Both;15 reps;Theraband   Theraband Level (Scapular Retraction) Level 3 (Green)   Row Both;15 reps;Theraband   Theraband Level (Row) Level 3 (Green)   Shoulder Extension Both;15 reps;Theraband   Theraband Level (Shoulder Extension) Level 3 (Green)   Other Standing Lumbar Exercises 3D hip excursion 10x   Other Standing Lumbar Exercises standing infront of door with cervical retraction and UE shoulder flexion 10x   Lumbar Exercises: Seated   Other Seated Lumbar Exercises cervical retraction 15x 5" with therapist facilitation   Other Seated Lumbar Exercises 3D Thoracic excursion 15x with UE movements given HEP             PT Short Term Goals - 07/16/15 [id number]  PT SHORT TERM GOAL #1   Title Patient will demosntrate presently limited motions in Lumbar and Thoracic spines to be [facility] in order to assist in improving overall posture and mobility, and to reduce pain    Time 3   Period Weeks   Status New   PT SHORT TERM GOAL #2   Title Patient to demonstrate  improved gait with improved mobility of rotation of spine and pelvis during gait, improved posture during gait, no signs of LLD, and midback pain 0/10 during gait in order to enhance overall pain free function    Time 3   Period Weeks   Status New   PT SHORT TERM GOAL #3   Title Patient to demonstrate improved posture at least 75% of the time with improvements in spinal curvature in order to assist in reducing pain and  improving general function    Time 3   Period Weeks   Status New   PT SHORT TERM GOAL #4   Title Patient to be independent in correctly and consistently performing appripriate HEP, to be updated PRN    Time 3   Period Weeks   Status New           PT Long Term Goals - 07/16/15 1631    PT LONG TERM GOAL #1   Title Patient will demonstrate strength at least 4/5 in all tested muscle groups and will demonstrate an improvement of at least 75 seconds in endurance of trunk flexors and extensors in order to enhance function and assist in reducing pain    Time 6   Period Weeks   Status New   PT LONG TERM GOAL #2   Title Patient to demonstrate no LLD for at least the past 3 weeks, and will be in dependent in self-correction techniques in order to assist in management of condition and general pain relief    Time 6   Period Weeks   Status New   PT LONG TERM GOAL #3   Title Patient to report she has been able to complete at least 1/2 shift at work with thoracic pain 0/10, good posture throughout, in order to assist in enhancing and promoting painfree function in community    Time 6   Period Weeks   Status New   PT LONG TERM GOAL #4   Title Patient to report she has been able to play at least 5-10 minutes of basketball at least 3 times per week with thoracic spine pain no more than 2/10 in order to assist in returning to PLOF and promoting overall improved health habits    Time 6   Period Weeks   Status New               Plan - 07/23/15 1519    Clinical Impression Statement Pt late for apt today.  Session focus on improving awareness with posture, progressed postural strengtheing as well as proximal musculature functional strengthening.  Pt required verbal and tactile cueing to reduce forward rolled shoulders and forward head.  Added standing CKC for gluteal strengtheing including 3D hip excursion, lunges and  standinginfront of door with ab sets and cervical retraciton during Bil UE  flexion for posture awareness and strengthening.  No reports of increased pain at end of session.     Pt will benefit from skilled therapeutic intervention in order to improve on the following deficits Abnormal gait;Hypomobility;Decreased endurance;Decreased strength;Pain;Difficulty walking;Increased muscle spasms;Postural dysfunction   Rehab Potential Good   PT Frequency 2x /  week   PT Duration 6 weeks   PT Treatment/Interventions ADLs/Self Care Home Management;Electrical Stimulation;Moist Heat;Gait training;Functional mobility training;Therapeutic activities;Therapeutic exercise;Neuromuscular re-education;Patient/family education;Manual techniques;Taping;Cryotherapy   PT Next Visit Plan Continue with current PT POC to improve posture training, core strengthen and endurance.  Check SI alignment for LLD.  Next session give therabands as HEP if able to complete correct form and technique.  Progress to prone posture and gluteal strengthening exercises, progress to quadruped opposite arm/leg        Problem List Patient Active Problem List   Diagnosis Date Noted  . Type I diabetes mellitus with complication, uncontrolled ([place]) 02/26/2014  . Noncompliance 02/26/2014  . Essential hypertension, benign 11/30/2012  . Acanthosis nigricans, acquired   . Overweight for pediatric patient   . Hypoglycemia associated with diabetes ([place])   . Goiter   . Tachycardia   . Diabetic autonomic neuropathy ([place])   . Diabetic peripheral neuropathy ([place])   . Thyroiditis, autoimmune   . Asthma   . Environmental allergies    Ihor [place], [place]; [place]  Aldona Lento 07/23/2015, 6:01 PM  [facility] [address] [place], [place], [address] Phone: [phone]   Fax:  [phone]  Name: BYANCA YOUNGBLOOD MRN: [id number] Date of Birth: 12-Oct-1995

## 2015-07-23 NOTE — Patient Instructions (Signed)
Thoracic Spine Matrix with Overhead Reaches

## 2015-07-24 ENCOUNTER — Ambulatory Visit (HOSPITAL_COMMUNITY): Payer: BLUE CROSS/BLUE SHIELD | Admitting: Physical Therapy

## 2015-07-24 DIAGNOSIS — M546 Pain in thoracic spine: Secondary | ICD-10-CM | POA: Diagnosis not present

## 2015-07-24 DIAGNOSIS — R293 Abnormal posture: Secondary | ICD-10-CM

## 2015-07-24 DIAGNOSIS — M6289 Other specified disorders of muscle: Secondary | ICD-10-CM

## 2015-07-24 DIAGNOSIS — M6281 Muscle weakness (generalized): Secondary | ICD-10-CM

## 2015-07-24 NOTE — Therapy (Signed)
[place], [place], [address] Phone: [phone]   Fax:  [phone]  Physical Therapy Treatment  Patient Details  Name: Lindsey Murillo MRN: [id number] Date of Birth: [birth year] Referring Provider: Arther Abbott, MD  Encounter Date: 07/24/2015      PT End of Session - 07/24/15 0856    Visit Number 4   Number of Visits 12   Date for PT Re-Evaluation 08/13/15   Authorization Type BCBS (30 visit limited combined with OT and chiro)   Authorization Time Period 07/16/15 to 08/27/15   Authorization - Visit Number 4   Authorization - Number of Visits 30   PT Start Time [id number]  Pt late for session   PT Stop Time 0846   PT Time Calculation (min) 36 min   Activity Tolerance Patient tolerated treatment well   Behavior During Therapy [facility] for tasks assessed/performed      Past Medical History  Diagnosis Date  . Type 1 diabetes mellitus in patient age 50-19 years with HbA1C goal below 7.5   . Acanthosis nigricans, acquired   . Overweight for pediatric patient   . Hypoglycemia associated with diabetes ([place])   . Goiter   . Tachycardia   . Diabetic autonomic neuropathy ([place])   . Diabetic peripheral neuropathy ([place])   . Thyroiditis, autoimmune   . Asthma   . Environmental allergies     No past surgical history on file.  There were no vitals filed for this visit.  Visit Diagnosis:  Midline thoracic back pain  Muscle weakness  Muscle fatigue  Poor posture      Subjective Assessment - 07/24/15 0812    Subjective Doing a little better, no pain right now.    Currently in Pain? No/denies   Aggravating Factors  nothing right now   Pain Relieving Factors not sure.                          [facility] Adult PT Treatment/Exercise - 07/24/15 0001    Lumbar Exercises: Standing   Functional Squats 15 reps  heavy cues for technique   Row Strengthening;Both   Theraband Level (Row) Level 3 (Green)   Row Limitations 2 x10   Shoulder Extension Strengthening;Both   Theraband Level (Shoulder Extension) Level 3 (Green)   Shoulder Extension Limitations 2 x10   Shoulder Exercises: Supine   Protraction Strengthening;Both;10 reps   Protraction Weight (lbs) 0   Protraction Limitations Cues needed for technique   Shoulder Exercises: Prone   Retraction Strengthening;Both;10 reps  2 sets   Retraction Weight (lbs) 3   Flexion Strengthening;Both;10 reps  2 sets   Flexion Weight (lbs) 0   Flexion Limitations scaption   Horizontal ABduction 1 Strengthening;Both;10 reps  2 x 10   Horizontal ABduction 1 Weight (lbs) 2                PT Education - 07/24/15 0855    Education provided Yes   Education Details education on exercise technique needed throughout session.    Person(s) Educated Patient   Methods Explanation;Demonstration;Tactile cues;Verbal cues   Comprehension Verbalized understanding;Returned demonstration;Need further instruction          PT Short Term Goals - 07/16/15 1628    PT SHORT TERM GOAL #1   Title Patient will demosntrate presently limited motions in Lumbar and Thoracic spines to be [facility] in order to assist in improving overall posture and mobility, and to  reduce pain    Time 3   Period Weeks   Status New   PT SHORT TERM GOAL #2   Title Patient to demonstrate  improved gait with improved mobility of rotation of spine and pelvis during gait, improved posture during gait, no signs of LLD, and midback pain 0/10 during gait in order to enhance overall pain free function    Time 3   Period Weeks   Status New   PT SHORT TERM GOAL #3   Title Patient to demonstrate improved posture at least 75% of the time with improvements in spinal curvature in order to assist in reducing pain and improving general function    Time 3   Period Weeks   Status New   PT SHORT TERM GOAL #4   Title Patient to be independent in correctly and consistently performing appripriate HEP, to  be updated PRN    Time 3   Period Weeks   Status New           PT Long Term Goals - 07/16/15 1631    PT LONG TERM GOAL #1   Title Patient will demonstrate strength at least 4/5 in all tested muscle groups and will demonstrate an improvement of at least 75 seconds in endurance of trunk flexors and extensors in order to enhance function and assist in reducing pain    Time 6   Period Weeks   Status New   PT LONG TERM GOAL #2   Title Patient to demonstrate no LLD for at least the past 3 weeks, and will be in dependent in self-correction techniques in order to assist in management of condition and general pain relief    Time 6   Period Weeks   Status New   PT LONG TERM GOAL #3   Title Patient to report she has been able to complete at least 1/2 shift at work with thoracic pain 0/10, good posture throughout, in order to assist in enhancing and promoting painfree function in community    Time 6   Period Weeks   Status New   PT LONG TERM GOAL #4   Title Patient to report she has been able to play at least 5-10 minutes of basketball at least 3 times per week with thoracic spine pain no more than 2/10 in order to assist in returning to PLOF and promoting overall improved health habits    Time [place] - 07/24/15 [id number]    Clinical Impression Statement Patient running late for session. Treatment focused on scapular stabilization exercises. Patient having difficulty with scapular based exercises due to strength. Frequent cues needed throughout for correct technique. Pt has evident scapular weakness which may be contributing to her thoracic pain. PT to continue tofollow and progress as tolerated.     PT Next Visit Plan Utilize a combination of core strengthening and scapular stabilization exercises. Pt needing heavy cues throughout session for correct exercise technique.    PT Home Exercise Plan Review prone scapular stabilization exercises, add  quadraped birddog.    Consulted and Agree with Plan of Care Patient        Problem List Patient Active Problem List   Diagnosis Date Noted  . Type I diabetes mellitus with complication, uncontrolled ([place]) 02/26/2014  . Noncompliance 02/26/2014  . Essential hypertension, benign 11/30/2012  . Acanthosis nigricans, acquired   . Overweight  for pediatric patient   . Hypoglycemia associated with diabetes ([place])   . Goiter   . Tachycardia   . Diabetic autonomic neuropathy ([place])   . Diabetic peripheral neuropathy ([place])   . Thyroiditis, autoimmune   . Asthma   . Environmental allergies     Cassell Clement, PT, CSCS Pager [phone]   07/24/2015, 9:30 AM  [facility] [address] [place], [place], [address] Phone: [phone]   Fax:  [phone]  Name: CLOTILE LEGARE MRN: [id number] Date of Birth: [birth year]

## 2015-07-29 ENCOUNTER — Ambulatory Visit (HOSPITAL_COMMUNITY): Payer: BLUE CROSS/BLUE SHIELD | Admitting: Physical Therapy

## 2015-07-29 DIAGNOSIS — M546 Pain in thoracic spine: Secondary | ICD-10-CM

## 2015-07-29 DIAGNOSIS — M2560 Stiffness of unspecified joint, not elsewhere classified: Secondary | ICD-10-CM

## 2015-07-29 DIAGNOSIS — M2170 Unequal limb length (acquired), unspecified site: Secondary | ICD-10-CM

## 2015-07-29 DIAGNOSIS — M6281 Muscle weakness (generalized): Secondary | ICD-10-CM

## 2015-07-29 DIAGNOSIS — M6289 Other specified disorders of muscle: Secondary | ICD-10-CM

## 2015-07-29 DIAGNOSIS — R293 Abnormal posture: Secondary | ICD-10-CM

## 2015-07-29 NOTE — Therapy (Signed)
[place], [place], [address] Phone: [phone]   Fax:  [phone]  Physical Therapy Treatment  Patient Details  Name: Lindsey Murillo MRN: [id number] Date of Birth: 10-29-1995 Referring Provider: Arther Abbott, MD  Encounter Date: 07/29/2015      PT End of Session - 07/29/15 0859    Visit Number 5   Number of Visits 12   Date for PT Re-Evaluation 08/13/15   Authorization Type BCBS (30 visit limited combined with OT and chiro)   Authorization Time Period 07/16/15 to 08/27/15   Authorization - Visit Number 4   Authorization - Number of Visits 30   PT Start Time 0819  Pt arrived late   PT Stop Time 0848   PT Time Calculation (min) 29 min   Activity Tolerance Patient tolerated treatment well   Behavior During Therapy [facility] for tasks assessed/performed      Past Medical History  Diagnosis Date  . Type 1 diabetes mellitus in patient age 35-19 years with HbA1C goal below 7.5   . Acanthosis nigricans, acquired   . Overweight for pediatric patient   . Hypoglycemia associated with diabetes ([place])   . Goiter   . Tachycardia   . Diabetic autonomic neuropathy ([place])   . Diabetic peripheral neuropathy ([place])   . Thyroiditis, autoimmune   . Asthma   . Environmental allergies     No past surgical history on file.  There were no vitals filed for this visit.  Visit Diagnosis:  Midline thoracic back pain  Muscle weakness  Muscle fatigue  Poor posture  Leg length discrepancy  Joint stiffness of spine      Subjective Assessment - 07/29/15 0850    Subjective Pt reports no pain currently and feels that overall her pain has decreased. She continues to not pain in the evening, especially after working all day.   Pertinent History DM1 and asthma (allergy related) with inhailer   Patient Stated Goals decrease back pain    Currently in Pain? No/denies                         [facility] Adult PT  Treatment/Exercise - 07/29/15 0001    Posture/Postural Control   Posture/Postural Control Postural limitations   Postural Limitations Anterior pelvic tilt  R anterior innominant rotation   Posture Comments Improved with MET   Exercises   Exercises Knee/Hip   Lumbar Exercises: Stretches   Active Hamstring Stretch 2 reps;30 seconds  seated    Lumbar Exercises: Prone   Other Prone Lumbar Exercises plank on elbows and knees with shoulder ER using green TB 3x20sec hold (+) trunk sag noted    Knee/Hip Exercises: Supine   Bridges Strengthening;2 sets;10 reps  with green TB around knees   Manual Therapy   Manual Therapy Soft tissue mobilization;Muscle Energy Technique   Manual therapy comments Performed separately from all other interventions   Soft tissue mobilization trigger point release to L iliopsoas, R hip adductor   Muscle Energy Technique R hip ant innominant                 PT Education - 07/29/15 1275    Education provided Yes   Education Details Reviewed and updated HEP and importance of adherence; Implications for manual treatment   Person(s) Educated Patient   Methods Explanation;Demonstration   Comprehension Verbalized understanding;Returned demonstration;Verbal cues required          PT Short  Term Goals - 07/16/15 1628    PT SHORT TERM GOAL #1   Title Patient will demosntrate presently limited motions in Lumbar and Thoracic spines to be [facility] in order to assist in improving overall posture and mobility, and to reduce pain    Time 3   Period Weeks   Status New   PT SHORT TERM GOAL #2   Title Patient to demonstrate  improved gait with improved mobility of rotation of spine and pelvis during gait, improved posture during gait, no signs of LLD, and midback pain 0/10 during gait in order to enhance overall pain free function    Time 3   Period Weeks   Status New   PT SHORT TERM GOAL #3   Title Patient to demonstrate improved posture at least 75% of the time with  improvements in spinal curvature in order to assist in reducing pain and improving general function    Time 3   Period Weeks   Status New   PT SHORT TERM GOAL #4   Title Patient to be independent in correctly and consistently performing appripriate HEP, to be updated PRN    Time 3   Period Weeks   Status New           PT Long Term Goals - 07/16/15 1631    PT LONG TERM GOAL #1   Title Patient will demonstrate strength at least 4/5 in all tested muscle groups and will demonstrate an improvement of at least 75 seconds in endurance of trunk flexors and extensors in order to enhance function and assist in reducing pain    Time 6   Period Weeks   Status New   PT LONG TERM GOAL #2   Title Patient to demonstrate no LLD for at least the past 3 weeks, and will be in dependent in self-correction techniques in order to assist in management of condition and general pain relief    Time 6   Period Weeks   Status New   PT LONG TERM GOAL #3   Title Patient to report she has been able to complete at least 1/2 shift at work with thoracic pain 0/10, good posture throughout, in order to assist in enhancing and promoting painfree function in community    Time 6   Period Weeks   Status New   PT LONG TERM GOAL #4   Title Patient to report she has been able to play at least 5-10 minutes of basketball at least 3 times per week with thoracic spine pain no more than 2/10 in order to assist in returning to PLOF and promoting overall improved health habits    Time 6   Period Weeks   Status New               Plan - 07/29/15 0900    Clinical Impression Statement Pt session focused on manual treatment and therex to address her pelvic asymmetries. She responds well to manual treatment with improved alignment and gait pattern however this will continue to occur secondary to poor flexibility and decreased core strength. She has increased difficulty with modified plank due to her trunk weakness. Therapist  will continue to reinforce HEP adherence and understanding and progress trunk and hip strengthening as tolerated.    Pt will benefit from skilled therapeutic intervention in order to improve on the following deficits Abnormal gait;Hypomobility;Decreased endurance;Decreased strength;Pain;Difficulty walking;Increased muscle spasms;Postural dysfunction;Impaired flexibility   Rehab Potential Good   PT Frequency 2x / week  PT Duration 6 weeks   PT Treatment/Interventions ADLs/Self Care Home Management;Electrical Stimulation;Moist Heat;Gait training;Functional mobility training;Therapeutic activities;Therapeutic exercise;Neuromuscular re-education;Patient/family education;Manual techniques;Taping;Cryotherapy   PT Next Visit Plan Utilize a combination of core strengthening and scapular stabilization exercises. Pt needing heavy cues throughout session for correct exercise technique.    PT Home Exercise Plan Continue with stretching and core stability progression, quad, plank, etc.    Consulted and Agree with Plan of Care Patient        Problem List Patient Active Problem List   Diagnosis Date Noted  . Type I diabetes mellitus with complication, uncontrolled ([place]) 02/26/2014  . Noncompliance 02/26/2014  . Essential hypertension, benign 11/30/2012  . Acanthosis nigricans, acquired   . Overweight for pediatric patient   . Hypoglycemia associated with diabetes ([place])   . Goiter   . Tachycardia   . Diabetic autonomic neuropathy ([place])   . Diabetic peripheral neuropathy ([place])   . Thyroiditis, autoimmune   . Asthma   . Environmental allergies     9:10 AM,07/29/2015 Elly Modena PT, DPT [facility] [address] [place], [place], [address] Phone: [phone]   Fax:  [phone]  Name: Lindsey Murillo MRN: [id number] Date of Birth: 11/15/1995

## 2015-07-30 ENCOUNTER — Encounter (HOSPITAL_COMMUNITY): Payer: BLUE CROSS/BLUE SHIELD | Admitting: Physical Therapy

## 2015-07-30 ENCOUNTER — Telehealth (HOSPITAL_COMMUNITY): Payer: Self-pay | Admitting: Physical Therapy

## 2015-07-30 NOTE — Telephone Encounter (Signed)
Patient a no-show to today's session; called and spoke to patient, who apologized and stated that she thought that her appointment was tomorrow. Reminded patient of time/date of next scheduled session.  Deniece Ree PT, DPT [phone]

## 2015-08-05 ENCOUNTER — Ambulatory Visit (HOSPITAL_COMMUNITY): Payer: BLUE CROSS/BLUE SHIELD | Admitting: Physical Therapy

## 2015-08-05 DIAGNOSIS — R293 Abnormal posture: Secondary | ICD-10-CM

## 2015-08-05 DIAGNOSIS — M6289 Other specified disorders of muscle: Secondary | ICD-10-CM

## 2015-08-05 DIAGNOSIS — M2170 Unequal limb length (acquired), unspecified site: Secondary | ICD-10-CM

## 2015-08-05 DIAGNOSIS — M546 Pain in thoracic spine: Secondary | ICD-10-CM | POA: Diagnosis not present

## 2015-08-05 DIAGNOSIS — M6281 Muscle weakness (generalized): Secondary | ICD-10-CM

## 2015-08-05 DIAGNOSIS — M2560 Stiffness of unspecified joint, not elsewhere classified: Secondary | ICD-10-CM

## 2015-08-05 NOTE — Therapy (Signed)
[place], [place], [address] Phone: [phone]   Fax:  [phone]  Physical Therapy Treatment (Re-Assessment)  Patient Details  Name: Lindsey Murillo MRN: [id number] Date of Birth: [birth year] Referring Provider: Arther Abbott, MD  Encounter Date: 08/05/2015      PT End of Session - 08/05/15 0853    Visit Number 6   Number of Visits 14   Date for PT Re-Evaluation 09/02/15   Authorization Type BCBS (30 visit limited combined with OT and chiro)   Authorization Time Period 07/16/15 to [id number]; added cert to cover additional visits on 3/28   Authorization - Visit Number 6   Authorization - Number of Visits 30   PT Start Time [phone]  patient arrived a few minutes late    PT Stop Time 0845   PT Time Calculation (min) 39 min   Activity Tolerance Patient tolerated treatment well   Behavior During Therapy [facility] for tasks assessed/performed      Past Medical History  Diagnosis Date  . Type 1 diabetes mellitus in patient age 47-19 years with HbA1C goal below 7.5   . Acanthosis nigricans, acquired   . Overweight for pediatric patient   . Hypoglycemia associated with diabetes ([place])   . Goiter   . Tachycardia   . Diabetic autonomic neuropathy ([place])   . Diabetic peripheral neuropathy ([place])   . Thyroiditis, autoimmune   . Asthma   . Environmental allergies     No past surgical history on file.  There were no vitals filed for this visit.  Visit Diagnosis:  Midline thoracic back pain - Plan: PT plan of care cert/re-cert  Muscle weakness - Plan: PT plan of care cert/re-cert  Muscle fatigue - Plan: PT plan of care cert/re-cert  Poor posture - Plan: PT plan of care cert/re-cert  Leg length discrepancy - Plan: PT plan of care cert/re-cert  Joint stiffness of spine - Plan: PT plan of care cert/re-cert      Subjective Assessment - 08/05/15 0808    Subjective Patient reports that she is feeling better since  starting PT; she reports that her HEP is easier, able to do more and stand more at work. She is still having some pain, at 7/10 at worst but this does not happen very often. She reports taht she has noticed that lifting heavy items does tend to make her pain worse.    Pertinent History DM1 and asthma (allergy related) with inhailer   How long can you sit comfortably? no limitations   How long can you stand comfortably? no limitations    How long can you walk comfortably? no limitations    Patient Stated Goals decrease back pain    Currently in Pain? No/denies            [facility] PT Assessment - 08/05/15 0001    Observation/Other Assessments   Observations no LLD noted today in supine    Focus on Therapeutic Outcomes (FOTO)  31% limited    AROM   Cervical Flexion WFL   Cervical Extension WFL   Cervical - Right Side Bend WFL    Cervical - Left Side Bend WFL    Cervical - Right Rotation WFL    Cervical - Left Rotation [facility]    Lumbar Flexion approx 30-40% limited    Lumbar Extension WFL   Lumbar - Right Side Bend rotation noted    Lumbar - Left Side Bend rotation noted    Thoracic  Flexion remains approximately 25-30% limited    Thoracic Extension 25-30% limited    Thoracic - Right Side [facility]    Thoracic - Left Side Bend [facility]    Thoracic - Right Rotation [facility]    Thoracic - Left Rotation [facility]    Strength   Right Hip Extension 3+/5   Right Hip ABduction 3+/5   Left Hip Extension 4-/5   Left Hip ABduction 3/5   Palpation   Spinal mobility lumbar spine and sacrum still very hypomobile (at least 75%); thoracic spine approx 30% hypomobile    SI assessment  no significant LLD noted in supine    Palpation comment reduced muscle spasm paraspinals    Ambulation/Gait   Gait Comments reduced rotation of hips and spine, proximal muscle weakness, possible LLD, reduced arm swing                      [facility] Adult PT Treatment/Exercise - 08/05/15 0001    Lumbar Exercises: Stretches    Active Hamstring Stretch 2 reps;30 seconds   Active Hamstring Stretch Limitations 14 inch box    Lumbar Exercises: Quadruped   Single Arm Raise 5 reps   Straight Leg Raise 5 reps   Knee/Hip Exercises: Supine   Bridges Both;2 sets;10 reps   Other Supine Knee/Hip Exercises supine hip abduction 2x10 red TB    Knee/Hip Exercises: Sidelying   Clams 1x10, red TB                 PT Education - 08/05/15 0853    Education provided Yes   Education Details plan of care moving forward, progress with skilled PT services    Person(s) Educated Patient   Methods Explanation   Comprehension Verbalized understanding          PT Short Term Goals - 08/05/15 0823    PT SHORT TERM GOAL #1   Title Patient will demosntrate presently limited motions in Lumbar and Thoracic spines to be [facility] in order to assist in improving overall posture and mobility, and to reduce pain    Baseline 3/28- remain somewhat hypomobile but pain has reduced    Time 3   Period Weeks   Status On-going   PT SHORT TERM GOAL #2   Title Patient to demonstrate  improved gait with improved mobility of rotation of spine and pelvis during gait, improved posture during gait, no signs of LLD, and midback pain 0/10 during gait in order to enhance overall pain free function    Baseline 3/28- gait deviations remain, pain remains around 4/10 during gait    Time 3   Period Weeks   Status On-going   PT SHORT TERM GOAL #3   Title Patient to demonstrate improved posture at least 75% of the time with improvements in spinal curvature in order to assist in reducing pain and improving general function    Time 3   Period Weeks   Status On-going   PT SHORT TERM GOAL #4   Title Patient to be independent in correctly and consistently performing appripriate HEP, to be updated PRN    Baseline 3/28- reports she is doing them every day   Time 3   Period Weeks   Status Achieved           PT Long Term Goals - 08/05/15 0825    PT LONG  TERM GOAL #1   Title Patient will demonstrate strength at least 4/5 in all tested muscle groups and will  demonstrate an improvement of at least 75 seconds in endurance of trunk flexors and extensors in order to enhance function and assist in reducing pain    Time 6   Period Weeks   Status On-going   PT LONG TERM GOAL #2   Title Patient to demonstrate no LLD for at least the past 3 weeks, and will be in dependent in self-correction techniques in order to assist in management of condition and general pain relief    Time 6   Period Weeks   Status On-going   PT LONG TERM GOAL #3   Title Patient to report she has been able to complete at least 1/2 shift at work with thoracic pain 0/10, good posture throughout, in order to assist in enhancing and promoting painfree function in community    Baseline 3/28- reports that this is getting better    Time 6   Period Weeks   Status On-going   PT LONG TERM GOAL #4   Title Patient to report she has been able to play at least 5-10 minutes of basketball at least 3 times per week with thoracic spine pain no more than 2/10 in order to assist in returning to PLOF and promoting overall improved health habits    Time 6   Period Weeks   Status On-going               Plan - 08/05/15 [id number]    Clinical Impression Statement Re-assessment performed today. Patient reports reduced pain, with only occasional exacerbation to 7/10 and an average pain of 1/10; she reports that her pain increases mostly when she is trying to lift heavy things. Upon examination, patient continues to demonstrate poor posture, some stiffness of thoracic and lumbar spines, continues to demosntate LLD (although did not need correction today due to minimal SI slippage), and continues to demonstrate significant muscle weakness and reduced core endurance. For rest of sessoin focused on functional core/proximal strengthening, also introduced quadruped today with great difficulty and mod cues to  maintain regional stability and keep dowel from falling off of spine with quadruped leg raises.  At this time recommend extension of skilled PT services in order to continue to address functional deficits and assist in reaching optimal level of function.    Pt will benefit from skilled therapeutic intervention in order to improve on the following deficits Abnormal gait;Hypomobility;Decreased endurance;Decreased strength;Pain;Difficulty walking;Increased muscle spasms;Postural dysfunction;Impaired flexibility   Rehab Potential Good   PT Frequency 2x / week   PT Duration 4 weeks   PT Treatment/Interventions ADLs/Self Care Home Management;Electrical Stimulation;Moist Heat;Gait training;Functional mobility training;Therapeutic activities;Therapeutic exercise;Neuromuscular re-education;Patient/family education;Manual techniques;Taping;Cryotherapy   PT Next Visit Plan Utilize a combination of core strengthening and scapular stabilization exercises. Pt needing heavy cues throughout session for correct exercise technique. Proper lifting and overhead lifting training as well.    PT Home Exercise Plan Continue with stretching and core stability progression, quad, plank, etc.    Consulted and Agree with Plan of Care Patient        Problem List Patient Active Problem List   Diagnosis Date Noted  . Type I diabetes mellitus with complication, uncontrolled ([place]) 02/26/2014  . Noncompliance 02/26/2014  . Essential hypertension, benign 11/30/2012  . Acanthosis nigricans, acquired   . Overweight for pediatric patient   . Hypoglycemia associated with diabetes ([place])   . Goiter   . Tachycardia   . Diabetic autonomic neuropathy ([place])   . Diabetic peripheral neuropathy ([place])   . Thyroiditis, autoimmune   .  Asthma   . Environmental allergies     Deniece Ree PT, DPT [facility] [address] [place], [place], [address] Phone: [phone]   Fax:   [phone]  Name: Lindsey Murillo MRN: [id number] Date of Birth: [birth year]

## 2015-08-07 ENCOUNTER — Ambulatory Visit (HOSPITAL_COMMUNITY): Payer: BLUE CROSS/BLUE SHIELD | Admitting: Physical Therapy

## 2015-08-07 ENCOUNTER — Encounter (HOSPITAL_COMMUNITY): Payer: Self-pay

## 2015-08-07 ENCOUNTER — Emergency Department (HOSPITAL_COMMUNITY)
Admission: EM | Admit: 2015-08-07 | Discharge: 2015-08-08 | Disposition: A | Discharge status: Home / Self Care | Payer: BLUE CROSS/BLUE SHIELD | Attending: Emergency Medicine | Admitting: Emergency Medicine

## 2015-08-07 DIAGNOSIS — R1033 Periumbilical pain: Secondary | ICD-10-CM | POA: Diagnosis present

## 2015-08-07 DIAGNOSIS — Z794 Long term (current) use of insulin: Secondary | ICD-10-CM | POA: Diagnosis not present

## 2015-08-07 DIAGNOSIS — R197 Diarrhea, unspecified: Secondary | ICD-10-CM

## 2015-08-07 DIAGNOSIS — J45909 Unspecified asthma, uncomplicated: Secondary | ICD-10-CM | POA: Diagnosis not present

## 2015-08-07 DIAGNOSIS — M2560 Stiffness of unspecified joint, not elsewhere classified: Secondary | ICD-10-CM

## 2015-08-07 DIAGNOSIS — E1042 Type 1 diabetes mellitus with diabetic polyneuropathy: Secondary | ICD-10-CM | POA: Diagnosis not present

## 2015-08-07 DIAGNOSIS — R293 Abnormal posture: Secondary | ICD-10-CM

## 2015-08-07 DIAGNOSIS — E1065 Type 1 diabetes mellitus with hyperglycemia: Secondary | ICD-10-CM | POA: Insufficient documentation

## 2015-08-07 DIAGNOSIS — M6281 Muscle weakness (generalized): Secondary | ICD-10-CM

## 2015-08-07 DIAGNOSIS — M546 Pain in thoracic spine: Secondary | ICD-10-CM

## 2015-08-07 DIAGNOSIS — M6289 Other specified disorders of muscle: Secondary | ICD-10-CM

## 2015-08-07 LAB — CBC WITH DIFFERENTIAL/PLATELET
Basophils Absolute: 0 10*3/uL (ref 0.0–0.1)
Basophils Relative: 0 %
EOS PCT: 0 %
Eosinophils Absolute: 0 10*3/uL (ref 0.0–0.7)
HEMATOCRIT: 38.3 % (ref 36.0–46.0)
Hemoglobin: 13.5 g/dL (ref 12.0–15.0)
LYMPHS ABS: 1.6 10*3/uL (ref 0.7–4.0)
LYMPHS PCT: 23 %
MCH: 29 pg (ref 26.0–34.0)
MCHC: 35.2 g/dL (ref 30.0–36.0)
MCV: 82.2 fL (ref 78.0–100.0)
Monocytes Absolute: 0.3 10*3/uL (ref 0.1–1.0)
Monocytes Relative: 4 %
NEUTROS ABS: 5 10*3/uL (ref 1.7–7.7)
Neutrophils Relative %: 73 %
PLATELETS: 207 10*3/uL (ref 150–400)
RBC: 4.66 MIL/uL (ref 3.87–5.11)
RDW: 12 % (ref 11.5–15.5)
WBC: 6.9 10*3/uL (ref 4.0–10.5)

## 2015-08-07 LAB — BASIC METABOLIC PANEL
Anion gap: 13 (ref 5–15)
BUN: 12 mg/dL (ref 6–20)
CHLORIDE: 89 mmol/L — AB (ref 101–111)
CO2: 25 mmol/L (ref 22–32)
Calcium: 8.5 mg/dL — ABNORMAL LOW (ref 8.9–10.3)
Creatinine, Ser: 0.66 mg/dL (ref 0.44–1.00)
GFR calc Af Amer: >60 mL/min (ref >60)
GLUCOSE: 687 mg/dL — AB (ref 65–99)
POTASSIUM: 3.1 mmol/L — AB (ref 3.5–5.1)
Sodium: 127 mmol/L — ABNORMAL LOW (ref 135–145)

## 2015-08-07 MED ORDER — SODIUM CHLORIDE 0.9 % IV BOLUS (SEPSIS)
2000.0000 mL | Freq: Once | INTRAVENOUS | Status: AC
  Administered 2015-08-07: 2000 mL via INTRAVENOUS

## 2015-08-07 MED ORDER — INSULIN REGULAR BOLUS VIA INFUSION: 5.0000 [IU] | Freq: Once | INTRAVENOUS | Status: DC

## 2015-08-07 NOTE — Therapy (Signed)
[place], [place], [address] Phone: [phone]   Fax:  [phone]  Physical Therapy Treatment  Patient Details  Name: Lindsey Murillo MRN: [id number] Date of Birth: 1996-05-06 Referring Provider: Arther Abbott, MD  Encounter Date: 08/07/2015      PT End of Session - 08/07/15 0934    Visit Number 7   Number of Visits 14   Date for PT Re-Evaluation 09/02/15   Authorization Type BC[employer] (30 visit limited combined with OT and chiro)   Authorization Time Period 07/16/15 to 08/27/15   Authorization - Visit Number 7   Authorization - Number of Visits 30   PT Start Time 0850   PT Stop Time 0930   PT Time Calculation (min) 40 min   Activity Tolerance Patient tolerated treatment well   Behavior During Therapy [facility] for tasks assessed/performed      Past Medical History  Diagnosis Date  . Type 1 diabetes mellitus in patient age 38-19 years with HbA1C goal below 7.5   . Acanthosis nigricans, acquired   . Overweight for pediatric patient   . Hypoglycemia associated with diabetes ([place])   . Goiter   . Tachycardia   . Diabetic autonomic neuropathy ([place])   . Diabetic peripheral neuropathy ([place])   . Thyroiditis, autoimmune   . Asthma   . Environmental allergies     No past surgical history on file.  There were no vitals filed for this visit.  Visit Diagnosis:  Midline thoracic back pain  Muscle weakness  Muscle fatigue  Poor posture  Joint stiffness of spine      Subjective Assessment - 08/07/15 0853    Subjective PT showed 42 minutes late for appt.  STates her back is not hurting today but her legs are hurting posteriorly.  REports compliance with HEP and work (at [employer]).   Currently in Pain? No/denies                         [facility] Adult PT Treatment/Exercise - 08/07/15 0001    Lumbar Exercises: Standing   Scapular Retraction Both;Theraband;20 reps   Theraband Level (Scapular  Retraction) Level 3 (Green)   Row Strengthening;Both;20 reps   Theraband Level (Row) Level 3 (Green)   Shoulder Extension Strengthening;Both;20 reps   Theraband Level (Shoulder Extension) Level 3 (Green)   Lumbar Exercises: Prone   Other Prone Lumbar Exercises planks elbow/feet 5X15"   Other Prone Lumbar Exercises side planks 5X15" each side   Lumbar Exercises: Quadruped   Single Arm Raise 10 reps   Straight Leg Raise 10 reps   Opposite Arm/Leg Raise 10 reps   Knee/Hip Exercises: Supine   Bridges Both;2 sets;15 reps   Knee/Hip Exercises: Sidelying   Hip ABduction 15 reps;2 sets;Both                  PT Short Term Goals - 08/05/15 [id number]    PT SHORT TERM GOAL #1   Title Patient will demosntrate presently limited motions in Lumbar and Thoracic spines to be [facility] in order to assist in improving overall posture and mobility, and to reduce pain    Baseline 3/28- remain somewhat hypomobile but pain has reduced    Time 3   Period Weeks   Status On-going   PT SHORT TERM GOAL #2   Title Patient to demonstrate  improved gait with improved mobility of rotation of spine and pelvis during gait, improved posture during  gait, no signs of LLD, and midback pain 0/10 during gait in order to enhance overall pain free function    Baseline 3/28- gait deviations remain, pain remains around 4/10 during gait    Time 3   Period Weeks   Status On-going   PT SHORT TERM GOAL #3   Title Patient to demonstrate improved posture at least 75% of the time with improvements in spinal curvature in order to assist in reducing pain and improving general function    Time 3   Period Weeks   Status On-going   PT SHORT TERM GOAL #4   Title Patient to be independent in correctly and consistently performing appripriate HEP, to be updated PRN    Baseline 3/28- reports she is doing them every day   Time 3   Period Weeks   Status Achieved           PT Long Term Goals - 08/05/15 0825    PT LONG TERM GOAL #1    Title Patient will demonstrate strength at least 4/5 in all tested muscle groups and will demonstrate an improvement of at least 75 seconds in endurance of trunk flexors and extensors in order to enhance function and assist in reducing pain    Time 6   Period Weeks   Status On-going   PT LONG TERM GOAL #2   Title Patient to demonstrate no LLD for at least the past 3 weeks, and will be in dependent in self-correction techniques in order to assist in management of condition and general pain relief    Time 6   Period Weeks   Status On-going   PT LONG TERM GOAL #3   Title Patient to report she has been able to complete at least 1/2 shift at work with thoracic pain 0/10, good posture throughout, in order to assist in enhancing and promoting painfree function in community    Baseline 3/28- reports that this is getting better    Time 6   Period Weeks   Status On-going   PT LONG TERM GOAL #4   Title Patient to report she has been able to play at least 5-10 minutes of basketball at least 3 times per week with thoracic spine pain no more than 2/10 in order to assist in returning to PLOF and promoting overall improved health habits    Time 6   Period Weeks   Status On-going               Plan - 08/07/15 0936    Clinical Impression Statement Pt without back pain, only with "pain" in LE's but more soreness as described by pateint.  Focused session on core and proximal muscular strenthening.  Progressed plants to elbow and feet with 15 second holds.  Also added side planks with therapist faciilitation for form and stability.  Added opp UE/LE in quadruped and progressed reps/sets of therex.  Pt able to complete all therex without c/o pain.   Pt will benefit from skilled therapeutic intervention in order to improve on the following deficits Abnormal gait;Hypomobility;Decreased endurance;Decreased strength;Pain;Difficulty walking;Increased muscle spasms;Postural dysfunction;Impaired flexibility    Rehab Potential Good   PT Frequency 2x / week   PT Duration 4 weeks   PT Treatment/Interventions ADLs/Self Care Home Management;Electrical Stimulation;Moist Heat;Gait training;Functional mobility training;Therapeutic activities;Therapeutic exercise;Neuromuscular re-education;Patient/family education;Manual techniques;Taping;Cryotherapy   PT Next Visit Plan Utilize a combination of core strengthening and scapular stabilization exercises. Pt needing heavy cues throughout session for correct exercise technique. Proper lifting and  overhead lifting training as well.    PT Home Exercise Plan Continue with stretching and core stability progression, quad, plank, etc.    Consulted and Agree with Plan of Care Patient        Problem List Patient Active Problem List   Diagnosis Date Noted  . Type I diabetes mellitus with complication, uncontrolled ([place]) 02/26/2014  . Noncompliance 02/26/2014  . Essential hypertension, benign 11/30/2012  . Acanthosis nigricans, acquired   . Overweight for pediatric patient   . Hypoglycemia associated with diabetes ([place])   . Goiter   . Tachycardia   . Diabetic autonomic neuropathy ([place])   . Diabetic peripheral neuropathy ([place])   . Thyroiditis, autoimmune   . Asthma   . Environmental allergies     Teena Irani, PTA/CLT [phone]  08/07/2015, 9:41 AM  [facility] [address] [place], [place], [address] Phone: [phone]   Fax:  [phone]  Name: ANNELISA WHITEHILL MRN: [id number] Date of Birth: 25-Jan-1996

## 2015-08-07 NOTE — ED Provider Notes (Signed)
CSN: [id number]     Arrival date & time 08/07/15  2201 History   First MD Initiated Contact with Patient 08/07/15 2304     Chief Complaint  Patient presents with  . Abdominal Pain     (Consider location/radiation/quality/duration/timing/severity/associated sxs/prior Treatment) Patient is a 20 y.o. female presenting with abdominal pain.  Abdominal Pain Pain location:  Periumbilical Pain quality: sharp   Pain radiates to:  Does not radiate Pain severity:  Moderate Onset quality:  Gradual Duration:  3 days Timing:  Intermittent Progression:  Improving Chronicity:  New Relieved by:  None tried Worsened by:  Eating Ineffective treatments:  None tried Associated symptoms: diarrhea   Associated symptoms: no chest pain, no chills, no cough, no dysuria, no fever, no nausea, no shortness of breath, no vaginal bleeding, no vaginal discharge and no vomiting    Wilmarie R Reifer is a 20 y.o. female with Type 1 diabetes dx at age 8 presents to the ED with abdominal pain and diarrhea that started 3 days ago. She reports that since arrival to the ED that her pain has subsided. She is a diabetic and states that she takes her insulin although her mother who is with her disagrees. Patient states she took her insulin tonight before she at a sub and her BS was 235 before she took 10 units Novolog. She has not had her bedtime dose of Toujeo Solostar 300units. Patient reports watery brown stools 6 times a day for the past 3 days. She has been on a regular diet.   Past Medical History  Diagnosis Date  . Type 1 diabetes mellitus in patient age 76-19 years with HbA1C goal below 7.5   . Acanthosis nigricans, acquired   . Overweight for pediatric patient   . Hypoglycemia associated with diabetes ([place])   . Goiter   . Tachycardia   . Diabetic autonomic neuropathy ([place])   . Diabetic peripheral neuropathy ([place])   . Thyroiditis, autoimmune   . Asthma   . Environmental allergies    History reviewed. No  pertinent past surgical history. Family History  Problem Relation Age of Onset  . Diabetes Mother     Type II DM  . Thyroid disease Mother   . Diabetes Maternal Grandmother     Type II DM  . Diabetes Cousin     Type II DM   Social History  Substance Use Topics  . Smoking status: Never Smoker   . Smokeless tobacco: Never Used  . Alcohol Use: No   OB History    No data available     Review of Systems  Constitutional: Negative for fever and chills.  HENT: Negative.   Eyes: Negative for pain and visual disturbance.  Respiratory: Negative for cough, chest tightness and shortness of breath.   Cardiovascular: Negative for chest pain and leg swelling.  Gastrointestinal: Positive for abdominal pain and diarrhea. Negative for nausea and vomiting.  Genitourinary: Negative for dysuria, urgency, frequency, vaginal bleeding and vaginal discharge.  Musculoskeletal: Negative for myalgias, back pain and gait problem.  Skin: Negative for rash and wound.  Neurological: Negative for syncope, light-headedness and headaches.  Psychiatric/Behavioral: Negative for confusion. The patient is not nervous/anxious.       Allergies  Review of patient's allergies indicates no known allergies.  Home Medications   Prior to Admission medications   Medication Sig Start Date End Date Taking? Authorizing Provider  Blood Glucose Monitoring Suppl (ACCU-CHEK AVIVA) device Use as instructed 06/03/15   Cassandria Anger, MD  glucagon 1 MG injection Follow package directions for low blood sugar. 01/05/12   Sherrlyn Hock, MD  glucose blood (ACCU-CHEK AVIVA) test strip Use to test 4 times a day 06/03/15   Cassandria Anger, MD  ibuprofen (ADVIL,MOTRIN) 200 MG tablet Take 400 mg by mouth every 6 (six) hours as needed for moderate pain.    Historical Provider, MD  insulin aspart (NOVOLOG FLEXPEN) 100 UNIT/ML FlexPen Inject 8-14 Units into the skin 3 (three) times daily with meals. 06/20/15   Cassandria Anger, MD  Insulin Glargine (TOUJEO SOLOSTAR) 300 UNIT/ML SOPN Inject 16 Units into the skin at bedtime. 06/12/15   Cassandria Anger, MD   BP 123/88 mmHg  Pulse 92  Temp(Src) 98.2 F (36.8 C) (Oral)  Resp 16  Ht 5\' 9"  (1.753 m)  Wt 48.535 kg  BMI 15.79 kg/m2  SpO2 99%  LMP 07/09/2015 Physical Exam  Constitutional: She is oriented to person, place, and time. No distress.  Thin AA female  HENT:  Head: Normocephalic and atraumatic.  Eyes: EOM are normal.  Neck: Normal range of motion. Neck supple.  Cardiovascular: Normal rate and regular rhythm.   Pulmonary/Chest: Effort normal and breath sounds normal.  Abdominal: Soft. Bowel sounds are normal. There is no tenderness.  Musculoskeletal: Normal range of motion.  Neurological: She is alert and oriented to person, place, and time. No cranial nerve deficit.  Skin: Skin is warm and dry.  Psychiatric: She has a normal mood and affect. Her behavior is normal.  Nursing note and vitals reviewed.   ED Course  Procedures (including critical care time) Labs, IV bolus 2,000ccs, regular insulin 5U IV and recheck glucose in one hour  Labs Review Results for orders placed or performed during the hospital encounter of 08/07/15 (from the past 24 hour(s))  CBC with Differential     Status: None   Collection Time: 08/07/15 10:27 PM  Result Value Ref Range   WBC 6.9 4.0 - 10.5 K/uL   RBC 4.66 3.87 - 5.11 MIL/uL   Hemoglobin 13.5 12.0 - 15.0 g/dL   HCT 38.3 36.0 - 46.0 %   MCV 82.2 78.0 - 100.0 fL   MCH 29.0 26.0 - 34.0 pg   MCHC 35.2 30.0 - 36.0 g/dL   RDW 12.0 11.5 - 15.5 %   Platelets 207 150 - 400 K/uL   Neutrophils Relative % 73 %   Neutro Abs 5.0 1.7 - 7.7 K/uL   Lymphocytes Relative 23 %   Lymphs Abs 1.6 0.7 - 4.0 K/uL   Monocytes Relative 4 %   Monocytes Absolute 0.3 0.1 - 1.0 K/uL   Eosinophils Relative 0 %   Eosinophils Absolute 0.0 0.0 - 0.7 K/uL   Basophils Relative 0 %   Basophils Absolute 0.0 0.0 - 0.1 K/uL  Basic  metabolic panel     Status: Abnormal   Collection Time: 08/07/15 10:27 PM  Result Value Ref Range   Sodium 127 (L) 135 - 145 mmol/L   Potassium 3.1 (L) 3.5 - 5.1 mmol/L   Chloride 89 (L) 101 - 111 mmol/L   CO2 25 22 - 32 mmol/L   Glucose, Bld 687 (HH) 65 - 99 mg/dL   BUN 12 6 - 20 mg/dL   Creatinine, Ser 0.66 0.44 - 1.00 mg/dL   Calcium 8.5 (L) 8.9 - 10.3 mg/dL   GFR calc non Af Amer >60 >60 mL/min   GFR calc Af Amer >60 >60 mL/min   Anion gap 13 5 -  15  Urinalysis, Routine w reflex microscopic (not at [facility])     Status: Abnormal   Collection Time: 08/07/15 11:12 PM  Result Value Ref Range   Color, Urine STRAW (A) YELLOW   APPearance CLEAR CLEAR   Specific Gravity, Urine 1.005 1.005 - 1.030   pH 5.0 5.0 - 8.0   Glucose, UA >1000 (A) NEGATIVE mg/dL   Hgb urine dipstick NEGATIVE NEGATIVE   Bilirubin Urine NEGATIVE NEGATIVE   Ketones, ur NEGATIVE NEGATIVE mg/dL   Protein, ur NEGATIVE NEGATIVE mg/dL   Nitrite NEGATIVE NEGATIVE   Leukocytes, UA NEGATIVE NEGATIVE  Urine microscopic-add on     Status: Abnormal   Collection Time: 08/07/15 11:12 PM  Result Value Ref Range   Squamous Epithelial / LPF 6-30 (A) NONE SEEN   WBC, UA 0-5 0 - 5 WBC/hpf   RBC / HPF 0-5 0 - 5 RBC/hpf   Bacteria, UA RARE (A) NONE SEEN   Urine-Other YEAST   CBG monitoring, ED     Status: Abnormal   Collection Time: 08/08/15 12:31 AM  Result Value Ref Range   Glucose-Capillary 349 (H) 65 - 99 mg/dL  CBG monitoring, ED     Status: Abnormal   Collection Time: 08/08/15  1:21 AM  Result Value Ref Range   Glucose-Capillary 291 (H) 65 - 99 mg/dL    Imaging Review No results found. I have personally reviewed and evaluated the lab results as part of my medical decision-making.  0120 will give a third 1,000 ccs of NSS IV and continue to observe the patient. Plan to d/c home if continues to improve.  MDM  20 y.o. female with type one diabetes here tonight with diarrhea and found to have elevated blood sugar.  Patient in NAD.  Patient receiving third bag of fluid and without complaints. Dr. Tomi Bamberger will continue care of the patient until time of discharge.   Final diagnoses:  Hyperglycemia due to type 1 diabetes mellitus ([place])  Diarrhea, unspecified type       [facility], NP 08/08/15 [id number]  Rolland Porter, MD 08/12/15 [phone]

## 2015-08-07 NOTE — ED Notes (Signed)
Pt reports abdominal pain/diarrhea that started 3 days ago. Denies vomiting.

## 2015-08-08 LAB — CBG MONITORING, ED
GLUCOSE-CAPILLARY: 291 mg/dL — AB (ref 65–99)
GLUCOSE-CAPILLARY: 213 mg/dL — AB (ref 65–99)
Glucose-Capillary: 246 mg/dL — ABNORMAL HIGH (ref 65–99)
Glucose-Capillary: 349 mg/dL — ABNORMAL HIGH (ref 65–99)

## 2015-08-08 LAB — URINALYSIS, ROUTINE W REFLEX MICROSCOPIC
Bilirubin Urine: NEGATIVE
HGB URINE DIPSTICK: NEGATIVE
Ketones, ur: NEGATIVE mg/dL
LEUKOCYTES UA: NEGATIVE
Nitrite: NEGATIVE
PH: 5 (ref 5.0–8.0)
Protein, ur: NEGATIVE mg/dL
SPECIFIC GRAVITY, URINE: 1.005 (ref 1.005–1.030)

## 2015-08-08 LAB — URINE MICROSCOPIC-ADD ON

## 2015-08-08 MED ORDER — SODIUM CHLORIDE 0.9 % IV SOLN
Freq: Once | INTRAVENOUS | Status: AC
  Administered 2015-08-08: 01:00:00 via INTRAVENOUS

## 2015-08-08 MED ORDER — SODIUM CHLORIDE 0.9 % IV SOLN
INTRAVENOUS | Status: AC
  Administered 2015-08-08: 01:00:00 via INTRAVENOUS

## 2015-08-08 MED ORDER — INSULIN ASPART 100 UNIT/ML ~~LOC~~ SOLN: 5.0000 [IU] | Freq: Once | SUBCUTANEOUS | Status: DC

## 2015-08-08 MED ORDER — INSULIN ASPART 100 UNIT/ML ~~LOC~~ SOLN
5.0000 [IU] | Freq: Once | SUBCUTANEOUS | Status: AC
  Administered 2015-08-08: 5 [IU] via INTRAVENOUS
  Filled 2015-08-08: qty 1

## 2015-08-08 NOTE — ED Notes (Signed)
Pt resting with eyes closed, appears to be in no distress. Respirations are even and unlabored.  

## 2015-08-08 NOTE — ED Notes (Signed)
NP Neese notified of CBG 291. Orders given at this time.

## 2015-08-08 NOTE — Discharge Instructions (Signed)
Your blood sugar was very high tonight and this can be dangerous. It is very important that you are checking your blood sugar and taking your insulin as directed by your primary care doctor. Uncontrolled blood sugar can cause many problems such as kidney failure, blindness, and death. It is important that you follow up with your doctor to discuss your insulin and continued care for your diabetes.  Stay on a bland diet for the next 24 hours to give your stomach a rest and then go to the [employer].

## 2015-08-08 NOTE — ED Notes (Signed)
Pt verbalized understanding of discharge teaching, follow up, and diet. Pt denied any questions at this time.

## 2015-08-12 ENCOUNTER — Encounter (HOSPITAL_COMMUNITY): Payer: Self-pay

## 2015-08-12 ENCOUNTER — Ambulatory Visit (HOSPITAL_COMMUNITY): Payer: BLUE CROSS/BLUE SHIELD | Attending: Orthopedic Surgery

## 2015-08-12 DIAGNOSIS — M6281 Muscle weakness (generalized): Secondary | ICD-10-CM | POA: Insufficient documentation

## 2015-08-12 DIAGNOSIS — M546 Pain in thoracic spine: Secondary | ICD-10-CM | POA: Diagnosis not present

## 2015-08-12 DIAGNOSIS — R293 Abnormal posture: Secondary | ICD-10-CM | POA: Insufficient documentation

## 2015-08-12 DIAGNOSIS — M2560 Stiffness of unspecified joint, not elsewhere classified: Secondary | ICD-10-CM | POA: Insufficient documentation

## 2015-08-12 DIAGNOSIS — M6289 Other specified disorders of muscle: Secondary | ICD-10-CM | POA: Diagnosis present

## 2015-08-12 DIAGNOSIS — M2170 Unequal limb length (acquired), unspecified site: Secondary | ICD-10-CM | POA: Diagnosis present

## 2015-08-12 DIAGNOSIS — R29898 Other symptoms and signs involving the musculoskeletal system: Secondary | ICD-10-CM | POA: Insufficient documentation

## 2015-08-12 NOTE — Therapy (Signed)
[place], [place], [address] Phone: [phone]   Fax:  [phone]  Physical Therapy Treatment  Patient Details  Name: Lindsey Murillo MRN: [id number] Date of Birth: 1995-10-07 Referring Provider: Arther Abbott, MD  Encounter Date: 08/12/2015      PT End of Session - 08/12/15 0806    Visit Number 8   Number of Visits 14   Date for PT Re-Evaluation 09/02/15   Authorization Type [employer] (30 visit limited combined with OT and chiro)   Authorization Time Period 07/16/15 to 08/27/15   Authorization - Visit Number 8   Authorization - Number of Visits 30   PT Start Time 0801   PT Stop Time [id number]   PT Time Calculation (min) 43 min   Activity Tolerance Patient tolerated treatment well   Behavior During Therapy [facility] for tasks assessed/performed      Past Medical History  Diagnosis Date  . Type 1 diabetes mellitus in patient age 87-19 years with HbA1C goal below 7.5   . Acanthosis nigricans, acquired   . Overweight for pediatric patient   . Hypoglycemia associated with diabetes ([place])   . Goiter   . Tachycardia   . Diabetic autonomic neuropathy ([place])   . Diabetic peripheral neuropathy ([place])   . Thyroiditis, autoimmune   . Asthma   . Environmental allergies     History reviewed. No pertinent past surgical history.  There were no vitals filed for this visit.  Visit Diagnosis:  Midline thoracic back pain  Muscle weakness  Muscle fatigue  Poor posture  Leg length discrepancy  Joint stiffness of spine      Subjective Assessment - 08/12/15 0801    Subjective Pt denied back pain upon arrival and stated "I have been doing good" with back pain ranging between a 0-1/10 on a VAS since last PT visit. Pt further noted improvements with heavy lifting with minimal to no issues experienced.    Pertinent History DM1 and asthma (allergy related) with inhailer   How long can you sit comfortably? no limitations    How long can you stand comfortably? no limitations    Patient Stated Goals decrease back pain    Currently in Pain? No/denies   Pain Score 0-No pain   Pain Location Back   Pain Orientation Mid                         [facility] Adult PT Treatment/Exercise - 08/12/15 0001    Lumbar Exercises: Stretches   Active Hamstring Stretch 30 seconds;3 reps, bilateral   Lumbar Exercises: Aerobic   UBE (Upper Arm Bike) Level 1 x 5 minutes (retro only) at beginning of PT tx    Lumbar Exercises: Standing   Scapular Retraction Both;Theraband;20 reps   Theraband Level (Scapular Retraction) Level 3 (Green)   Row Strengthening;Both;20 reps   Theraband Level (Row) Level 3 (Green)   Shoulder Extension Strengthening;Both;20 reps   Theraband Level (Shoulder Extension) Level 3 (Green)   Other Standing Lumbar Exercises 3D hip excursion 10x   Lumbar Exercises: Quadruped   Single Arm Raise 15 reps   Opposite Arm/Leg Raise 15 reps   Plank 5 sets x 30 seconds (front plank on plinth)    Knee/Hip Exercises: Standing   Other Standing Knee Exercises side steps with red thera-band x 8 steps, bilateral x 3 sets with close supervision           PT Education -  08/12/15 0806    Education provided Yes   Education Details optimal posture with sitting and standing and core bracing with functional mobility activities and lifting    Person(s) Educated Patient   Methods Explanation   Comprehension Verbalized understanding          PT Short Term Goals - 08/05/15 0823    PT SHORT TERM GOAL #1   Title Patient will demosntrate presently limited motions in Lumbar and Thoracic spines to be [facility] in order to assist in improving overall posture and mobility, and to reduce pain    Baseline 3/28- remain somewhat hypomobile but pain has reduced    Time 3   Period Weeks   Status On-going   PT SHORT TERM GOAL #2   Title Patient to demonstrate  improved gait with improved mobility of rotation of spine and pelvis  during gait, improved posture during gait, no signs of LLD, and midback pain 0/10 during gait in order to enhance overall pain free function    Baseline 3/28- gait deviations remain, pain remains around 4/10 during gait    Time 3   Period Weeks   Status On-going   PT SHORT TERM GOAL #3   Title Patient to demonstrate improved posture at least 75% of the time with improvements in spinal curvature in order to assist in reducing pain and improving general function    Time 3   Period Weeks   Status On-going   PT SHORT TERM GOAL #4   Title Patient to be independent in correctly and consistently performing appripriate HEP, to be updated PRN    Baseline 3/28- reports she is doing them every day   Time 3   Period Weeks   Status Achieved           PT Long Term Goals - 08/05/15 0825    PT LONG TERM GOAL #1   Title Patient will demonstrate strength at least 4/5 in all tested muscle groups and will demonstrate an improvement of at least 75 seconds in endurance of trunk flexors and extensors in order to enhance function and assist in reducing pain    Time 6   Period Weeks   Status On-going   PT LONG TERM GOAL #2   Title Patient to demonstrate no LLD for at least the past 3 weeks, and will be in dependent in self-correction techniques in order to assist in management of condition and general pain relief    Time 6   Period Weeks   Status On-going   PT LONG TERM GOAL #3   Title Patient to report she has been able to complete at least 1/2 shift at work with thoracic pain 0/10, good posture throughout, in order to assist in enhancing and promoting painfree function in community    Baseline 3/28- reports that this is getting better    Time 6   Period Weeks   Status On-going   PT LONG TERM GOAL #4   Title Patient to report she has been able to play at least 5-10 minutes of basketball at least 3 times per week with thoracic spine pain no more than 2/10 in order to assist in returning to PLOF and  promoting overall improved health habits    Time 6   Period Weeks   Status On-going               Plan - 08/12/15 [id number]    Clinical Impression Statement PT tx was focused on postural and core  strengthening. Progressed core stabilization with addition of front planks. Pt required verbal cues to maintain lumbar spine in neutral position and to avoid sagging of hips. Pt was able to maintain front plank for ~30 seconds with good spinal alignment. Additional cues required for proper set-up with planks. Frequent verbal and visual cues required for bird dog ther ex in order to maintain starting position. Improved understanding demo with subsequent reps. Pt remained pain-free t/o today's tx with no adverse effects reported per pt. Pt is making great progress towards stated goals with less severe mid back( thoracic) pain with daily activities and work related activities. Continue with current POC with focus on core/postural strengthening.   Pt will benefit from skilled therapeutic intervention in order to improve on the following deficits Abnormal gait;Hypomobility;Decreased endurance;Decreased strength;Pain;Difficulty walking;Increased muscle spasms;Postural dysfunction;Impaired flexibility   Rehab Potential Good   PT Frequency 2x / week   PT Duration 4 weeks   PT Treatment/Interventions ADLs/Self Care Home Management;Electrical Stimulation;Moist Heat;Gait training;Functional mobility training;Therapeutic activities;Therapeutic exercise;Neuromuscular re-education;Patient/family education;Manual techniques;Taping;Cryotherapy   PT Next Visit Plan Next visit to focus on combination of progressed core strengthening and scapular stabilization exercises; Review front plank to ensure proper alignment and understanding with ther ex in order to add to HEP   PT Home Exercise Plan Continue with stretching and core stability progression, quad, plank, etc.    Consulted and Agree with Plan of Care Patient         Problem List Patient Active Problem List   Diagnosis Date Noted  . Type I diabetes mellitus with complication, uncontrolled ([place]) 02/26/2014  . Noncompliance 02/26/2014  . Essential hypertension, benign 11/30/2012  . Acanthosis nigricans, acquired   . Overweight for pediatric patient   . Hypoglycemia associated with diabetes ([place])   . Goiter   . Tachycardia   . Diabetic autonomic neuropathy ([place])   . Diabetic peripheral neuropathy ([place])   . Thyroiditis, autoimmune   . Asthma   . Environmental allergies     Garen Lah, PT, DPT   08/12/2015, 8:44 AM  [place] [address] [place], [place], [address] Phone: [phone]   Fax:  [phone]  Name: Lindsey Murillo MRN: [id number] Date of Birth: [birth year]

## 2015-08-14 ENCOUNTER — Ambulatory Visit (HOSPITAL_COMMUNITY): Payer: BLUE CROSS/BLUE SHIELD | Admitting: Physical Therapy

## 2015-08-15 ENCOUNTER — Ambulatory Visit: Payer: BLUE CROSS/BLUE SHIELD | Admitting: "Endocrinology

## 2015-08-19 ENCOUNTER — Ambulatory Visit (HOSPITAL_COMMUNITY): Payer: BLUE CROSS/BLUE SHIELD

## 2015-08-19 DIAGNOSIS — R293 Abnormal posture: Secondary | ICD-10-CM

## 2015-08-19 DIAGNOSIS — M6281 Muscle weakness (generalized): Secondary | ICD-10-CM

## 2015-08-19 DIAGNOSIS — M546 Pain in thoracic spine: Secondary | ICD-10-CM

## 2015-08-19 DIAGNOSIS — R29898 Other symptoms and signs involving the musculoskeletal system: Secondary | ICD-10-CM

## 2015-08-19 NOTE — Therapy (Addendum)
[place], [place], [address] Phone: [phone]   Fax:  [phone]  Physical Therapy Treatment  Patient Details  Name: Lindsey Murillo MRN: [id number] Date of Birth: [birth year] Referring Provider: Arther Abbott, MD  Encounter Date: 08/19/2015      PT End of Session - 08/19/15 0818    Visit Number 9   Number of Visits 14   Date for PT Re-Evaluation 09/02/15   Authorization Type BCBS (30 visit limited combined with OT and chiro)   Authorization Time Period 07/16/15 to 08/27/15   Authorization - Visit Number 9   Authorization - Number of Visits 30   PT Start Time 0815   PT Stop Time 0903   PT Time Calculation (min) 48 min   Activity Tolerance Patient tolerated treatment well   Behavior During Therapy [facility] for tasks assessed/performed      Past Medical History  Diagnosis Date  . Type 1 diabetes mellitus in patient age 74-19 years with HbA1C goal below 7.5   . Acanthosis nigricans, acquired   . Overweight for pediatric patient   . Hypoglycemia associated with diabetes ([place])   . Goiter   . Tachycardia   . Diabetic autonomic neuropathy ([place])   . Diabetic peripheral neuropathy ([place])   . Thyroiditis, autoimmune   . Asthma   . Environmental allergies     No past surgical history on file.  There were no vitals filed for this visit.      Subjective Assessment - 08/19/15 0813    Subjective Pt stated she has been pain free for the last couple sessions, continues to be compliant with HEP.   Pertinent History DM1 and asthma (allergy related) with inhailer   Patient Stated Goals decrease back pain    Currently in Pain? No/denies                         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;3 reps   Active Hamstring Stretch  Limitations 14 inch box    Lumbar Exercises: Aerobic   UBE (Upper Arm Bike) Level 1 x 5 minutes (retro only) at beginning of PT tx    Lumbar Exercises: Standing   Functional Squats 15 reps   Scapular Retraction Both;Theraband;20 reps   Theraband Level (Scapular Retraction) Level 3 (Green)   Row Strengthening;Both;20 reps   Theraband Level (Row) Level 3 (Green)   Shoulder Extension Strengthening;Both;20 reps   Theraband Level (Shoulder Extension) Level 3 (Green)   Lumbar Exercises: Prone   Other Prone Lumbar Exercises  side plank 3x 30"   Lumbar Exercises: Quadruped   Single Arm Raise 15 reps   Opposite Arm/Leg Raise Right arm/Left leg;Left arm/Right leg;15 reps;5 seconds   Plank 5x 30" front plank on elbows/feet                  PT Short Term Goals - 08/05/15 [id number]    PT SHORT TERM GOAL #1   Title Patient will demosntrate presently limited motions in Lumbar and Thoracic spines to be [facility] in order to assist in improving overall posture and mobility, and to reduce pain    Baseline 3/28- remain somewhat hypomobile but pain has reduced    Time 3   Period Weeks  Status On-going   PT SHORT TERM GOAL #2   Title Patient to demonstrate  improved gait with improved mobility of rotation of spine and pelvis during gait, improved posture during gait, no signs of LLD, and midback pain 0/10 during gait in order to enhance overall pain free function    Baseline 3/28- gait deviations remain, pain remains around 4/10 during gait    Time 3   Period Weeks   Status On-going   PT SHORT TERM GOAL #3   Title Patient to demonstrate improved posture at least 75% of the time with improvements in spinal curvature in order to assist in reducing pain and improving general function    Time 3   Period Weeks   Status On-going   PT SHORT TERM GOAL #4   Title Patient to be independent in correctly and consistently performing appripriate HEP, to be updated PRN    Baseline 3/28- reports she is doing them  every day   Time 3   Period Weeks   Status Achieved           PT Long Term Goals - 08/05/15 0825    PT LONG TERM GOAL #1   Title Patient will demonstrate strength at least 4/5 in all tested muscle groups and will demonstrate an improvement of at least 75 seconds in endurance of trunk flexors and extensors in order to enhance function and assist in reducing pain    Time 6   Period Weeks   Status On-going   PT LONG TERM GOAL #2   Title Patient to demonstrate no LLD for at least the past 3 weeks, and will be in dependent in self-correction techniques in order to assist in management of condition and general pain relief    Time 6   Period Weeks   Status On-going   PT LONG TERM GOAL #3   Title Patient to report she has been able to complete at least 1/2 shift at work with thoracic pain 0/10, good posture throughout, in order to assist in enhancing and promoting painfree function in community    Baseline 3/28- reports that this is getting better    Time 6   Period Weeks   Status On-going   PT LONG TERM GOAL #4   Title Patient to report she has been able to play at least 5-10 minutes of basketball at least 3 times per week with thoracic spine pain no more than 2/10 in order to assist in returning to PLOF and promoting overall improved health habits    Time 6   Period Weeks   Status On-going               Plan - 08/19/15 [id number]    Clinical Impression Statement Treatment focus on postural and core strengthening.  Pt required verbal cueing for proper form with standing and plank exercises to improve spinal alignment to avoid sagging of hips and forward headed posture.  Resumed side planks and warrior poses for core stability with cueing for form.  Pt able to demonstrate appropriate form with front plank following minimal cueing, reports compliance with HEP.  Noted balance deficits with new warrior poses, SBA required for safety with new exercise.  No reports of pain through session.    Rehab Potential Good   PT Frequency 2x / week   PT Duration 5 weeks   PT Treatment/Interventions ADLs/Self Care Home Management;Electrical Stimulation;Moist Heat;Gait training;Functional mobility training;Therapeutic activities;Therapeutic exercise;Neuromuscular re-education;Patient/family education;Manual techniques;Taping;Cryotherapy   PT Next Visit Plan  Next visit to focus on combination of progressed core strengthening and scapular stabilization exercises;    PT Home Exercise Plan Continue with stretching and core stability progression, quad, plank, etc.       Patient will benefit from skilled therapeutic intervention in order to improve the following deficits and impairments:  Abnormal gait, Hypomobility, Decreased endurance, Decreased strength, Pain, Difficulty walking, Increased muscle spasms, Postural dysfunction, Impaired flexibility  Visit Diagnosis: Pain in thoracic spine  Muscle weakness (generalized)  Abnormal posture  Other symptoms and signs involving the musculoskeletal system     Problem List Patient Active Problem List   Diagnosis Date Noted  . Type I diabetes mellitus with complication, uncontrolled ([place]) 02/26/2014  . Noncompliance 02/26/2014  . Essential hypertension, benign 11/30/2012  . Acanthosis nigricans, acquired   . Overweight for pediatric patient   . Hypoglycemia associated with diabetes ([place])   . Goiter   . Tachycardia   . Diabetic autonomic neuropathy ([place])   . Diabetic peripheral neuropathy ([place])   . Thyroiditis, autoimmune   . Asthma   . Environmental allergies    Ihor [place], [place]; [place]  Aldona Lento 08/19/2015, 2:02 PM  [place], [place], [address] Phone: [phone]   Fax:  [phone]  Name: Lindsey Murillo MRN: [id number] Date of Birth: [birth year]

## 2015-08-21 ENCOUNTER — Ambulatory Visit (HOSPITAL_COMMUNITY): Payer: BLUE CROSS/BLUE SHIELD | Admitting: Physical Therapy

## 2015-08-21 DIAGNOSIS — R293 Abnormal posture: Secondary | ICD-10-CM

## 2015-08-21 DIAGNOSIS — M546 Pain in thoracic spine: Secondary | ICD-10-CM

## 2015-08-21 DIAGNOSIS — M6281 Muscle weakness (generalized): Secondary | ICD-10-CM

## 2015-08-21 DIAGNOSIS — R29898 Other symptoms and signs involving the musculoskeletal system: Secondary | ICD-10-CM

## 2015-08-21 NOTE — Therapy (Signed)
[place], [place], [address] Phone: [phone]   Fax:  [phone]  Physical Therapy Treatment  Patient Details  Name: Lindsey Murillo MRN: [id number] Date of Birth: 05/19/1995 Referring Provider: Arther Abbott, MD  Encounter Date: 08/21/2015      PT End of Session - 08/21/15 1101    Visit Number 10   Number of Visits 14   Date for PT Re-Evaluation 09/02/15   Authorization Type BCBS (30 visit limited combined with OT and chiro)   Authorization Time Period 07/16/15 to 08/27/15   Authorization - Visit Number 10   Authorization - Number of Visits 30   PT Start Time 0819   PT Stop Time 0900   PT Time Calculation (min) 41 min   Activity Tolerance Patient tolerated treatment well   Behavior During Therapy [facility] for tasks assessed/performed      Past Medical History  Diagnosis Date  . Type 1 diabetes mellitus in patient age 46-19 years with HbA1C goal below 7.5   . Acanthosis nigricans, acquired   . Overweight for pediatric patient   . Hypoglycemia associated with diabetes ([place])   . Goiter   . Tachycardia   . Diabetic autonomic neuropathy ([place])   . Diabetic peripheral neuropathy ([place])   . Thyroiditis, autoimmune   . Asthma   . Environmental allergies     No past surgical history on file.  There were no vitals filed for this visit.      Subjective Assessment - 08/21/15 0823    Subjective Pt states she continues to be pain free and performing her HEP without difficulty. No other complaints.   Pertinent History DM1 and asthma (allergy related) with inhailer   Currently in Pain? No/denies                         [facility] Adult PT Treatment/Exercise - 08/21/15 0001    Lumbar Exercises: Aerobic   Elliptical 4 min L1   Lumbar Exercises: Seated   Other Seated Lumbar Exercises anterior/posterior and lateral pelvic tilts x3 min   Lumbar Exercises: Supine   Bridge 5 reps  x3 sets   Bridge Limitations alteranting knee extension  (+) Rt hip drop with Rt knee ext.    Lumbar Exercises: Sidelying   Other Sidelying Lumbar Exercises plank 2x20 sec each side  increased difficulty with R side                PT Education - 08/21/15 1146    Education provided Yes   Education Details continued HEP adherence and improved posture during work; correct technqiue with exercise   Person(s) Educated Patient   Methods Explanation   Comprehension Verbalized understanding;Returned demonstration          PT Short Term Goals - 08/05/15 0823    PT SHORT TERM GOAL #1   Title Patient will demosntrate presently limited motions in Lumbar and Thoracic spines to be [facility] in order to assist in improving overall posture and mobility, and to reduce pain    Baseline 3/28- remain somewhat hypomobile but pain has reduced    Time 3   Period Weeks   Status On-going   PT SHORT TERM GOAL #2   Title Patient to demonstrate  improved gait with improved mobility of rotation of spine and pelvis during gait, improved posture during gait, no signs of LLD, and midback pain 0/10 during gait in order to enhance overall pain  free function    Baseline 3/28- gait deviations remain, pain remains around 4/10 during gait    Time 3   Period Weeks   Status On-going   PT SHORT TERM GOAL #3   Title Patient to demonstrate improved posture at least 75% of the time with improvements in spinal curvature in order to assist in reducing pain and improving general function    Time 3   Period Weeks   Status On-going   PT SHORT TERM GOAL #4   Title Patient to be independent in correctly and consistently performing appripriate HEP, to be updated PRN    Baseline 3/28- reports she is doing them every day   Time 3   Period Weeks   Status Achieved           PT Long Term Goals - 08/05/15 0825    PT LONG TERM GOAL #1   Title Patient will demonstrate strength at least 4/5 in all tested muscle groups and will  demonstrate an improvement of at least 75 seconds in endurance of trunk flexors and extensors in order to enhance function and assist in reducing pain    Time 6   Period Weeks   Status On-going   PT LONG TERM GOAL #2   Title Patient to demonstrate no LLD for at least the past 3 weeks, and will be in dependent in self-correction techniques in order to assist in management of condition and general pain relief    Time 6   Period Weeks   Status On-going   PT LONG TERM GOAL #3   Title Patient to report she has been able to complete at least 1/2 shift at work with thoracic pain 0/10, good posture throughout, in order to assist in enhancing and promoting painfree function in community    Baseline 3/28- reports that this is getting better    Time 6   Period Weeks   Status On-going   PT LONG TERM GOAL #4   Title Patient to report she has been able to play at least 5-10 minutes of basketball at least 3 times per week with thoracic spine pain no more than 2/10 in order to assist in returning to PLOF and promoting overall improved health habits    Time 6   Period Weeks   Status On-going               Plan - 08/21/15 1104    Clinical Impression Statement Today's session continued focus on core strength and endurance. She demonstrates improved tolerance to exercise progressions with increasing reps and level of difficulty without increased pain/symptoms. She demonstrated limited trunk endurance deviations during side plank and other activity which improved with cuing from therapist and pt was encouraged to ensure proper technique during exercises at home for continued improvement. Will continue current POC.    Rehab Potential Good   PT Frequency 2x / week   PT Duration Other (comment)  5 weeks    PT Treatment/Interventions ADLs/Self Care Home Management;Electrical Stimulation;Moist Heat;Gait training;Functional mobility training;Therapeutic activities;Therapeutic exercise;Neuromuscular  re-education;Patient/family education;Manual techniques;Taping;Cryotherapy   PT Next Visit Plan begin working on dynamic core stablization in sitting and standing, LE strengthening   PT Home Exercise Plan updated with bridge with alt knee ext., side plank, cat cow, pelvic tilts   Consulted and Agree with Plan of Care Patient      Patient will benefit from skilled therapeutic intervention in order to improve the following deficits and impairments:  Abnormal gait, Hypomobility, Decreased  endurance, Decreased strength, Pain, Difficulty walking, Increased muscle spasms, Postural dysfunction, Impaired flexibility  Visit Diagnosis: Pain in thoracic spine  Muscle weakness (generalized)  Abnormal posture  Other symptoms and signs involving the musculoskeletal system     Problem List Patient Active Problem List   Diagnosis Date Noted  . Type I diabetes mellitus with complication, uncontrolled ([place]) 02/26/2014  . Noncompliance 02/26/2014  . Essential hypertension, benign 11/30/2012  . Acanthosis nigricans, acquired   . Overweight for pediatric patient   . Hypoglycemia associated with diabetes ([place])   . Goiter   . Tachycardia   . Diabetic autonomic neuropathy ([place])   . Diabetic peripheral neuropathy ([place])   . Thyroiditis, autoimmune   . Asthma   . Environmental allergies    11:52 AM,08/21/2015 Elly Modena PT, DPT [facility] [place] [address] [place], [place], [address] Phone: [phone]   Fax:  [phone]  Name: Lindsey Murillo MRN: [id number] Date of Birth: Sep 06, 1995

## 2015-08-26 ENCOUNTER — Encounter (HOSPITAL_COMMUNITY): Payer: BLUE CROSS/BLUE SHIELD

## 2015-08-28 ENCOUNTER — Telehealth (HOSPITAL_COMMUNITY): Payer: Self-pay | Admitting: Physical Therapy

## 2015-08-28 ENCOUNTER — Ambulatory Visit (HOSPITAL_COMMUNITY): Payer: BLUE CROSS/BLUE SHIELD | Admitting: Physical Therapy

## 2015-08-28 NOTE — Telephone Encounter (Signed)
Notified pt of missed apt, and reminded her of her next apt on 4/25 at [place]. Encouraged her to call with any questions/concerns.   9:38 AM,08/28/2015 Elly Modena PT, [facility] [phone]

## 2015-09-02 ENCOUNTER — Ambulatory Visit (HOSPITAL_COMMUNITY): Payer: BLUE CROSS/BLUE SHIELD | Admitting: Physical Therapy

## 2015-09-02 DIAGNOSIS — M546 Pain in thoracic spine: Secondary | ICD-10-CM | POA: Diagnosis not present

## 2015-09-02 DIAGNOSIS — M6281 Muscle weakness (generalized): Secondary | ICD-10-CM

## 2015-09-02 DIAGNOSIS — R293 Abnormal posture: Secondary | ICD-10-CM

## 2015-09-02 DIAGNOSIS — R29898 Other symptoms and signs involving the musculoskeletal system: Secondary | ICD-10-CM

## 2015-09-02 NOTE — Therapy (Signed)
[place], [place], [address] Phone: [phone]   Fax:  [phone]  Physical Therapy Treatment (Discharge)  Patient Details  Name: TANITH DAGOSTINO MRN: [id number] Date of Birth: June 11, 1995 Referring Provider: Arther Abbott, MD  Encounter Date: 09/02/2015      PT End of Session - 09/02/15 0951    Visit Number 11   Number of Visits 11   Authorization Type [employer] (30 visit limited combined with OT and chiro)   Authorization Time Period 07/16/15 to 08/27/15   Authorization - Visit Number 11   Authorization - Number of Visits 30   PT Start Time 0902   PT Stop Time 0944   PT Time Calculation (min) 42 min   Activity Tolerance Patient tolerated treatment well   Behavior During Therapy [facility] for tasks assessed/performed      Past Medical History  Diagnosis Date  . Type 1 diabetes mellitus in patient age 21-19 years with HbA1C goal below 7.5   . Acanthosis nigricans, acquired   . Overweight for pediatric patient   . Hypoglycemia associated with diabetes ([place])   . Goiter   . Tachycardia   . Diabetic autonomic neuropathy ([place])   . Diabetic peripheral neuropathy ([place])   . Thyroiditis, autoimmune   . Asthma   . Environmental allergies     No past surgical history on file.  There were no vitals filed for this visit.      Subjective Assessment - 09/02/15 0903    Subjective Patient reports that she has been feeling good; she does report some mild LBP but otherwise no pain. Has been able to lift things easier, but reports that the last big remaining concern is her legs as do feel weak. No falls or close calls recently.    Pertinent History DM1 and asthma (allergy related) with inhailer   How long can you sit comfortably? no limitations   How long can you stand comfortably? no limitations    How long can you walk comfortably? no limitations    Patient Stated Goals decrease back pain    Currently in Pain? Yes   Pain Score 3    Pain Location Back   Pain Orientation Lower   Pain Descriptors / Indicators Aching   Pain Type Acute pain   Pain Radiating Towards none    Pain Onset Today   Pain Frequency Constant   Aggravating Factors  unsure    Pain Relieving Factors none    Effect of Pain on Daily Activities none             [facility] PT Assessment - 09/02/15 0001    Observation/Other Assessments   Observations no LLD noted in supine today    Focus on Therapeutic Outcomes (FOTO)  31% limited    AROM   Cervical Flexion WFL    Cervical Extension WFL    Cervical - Right Side Bend [facility]    Cervical - Left Side Bend WFL    Cervical - Right Rotation WFL    Cervical - Left Rotation WFL    Lumbar Flexion min limitation    Lumbar Extension WFL    Lumbar - Right Side Bend [facility]    Lumbar - Left Side [facility]    Thoracic Flexion [facility]    Thoracic Extension [facility]    Thoracic - Right Side [facility]    Thoracic - Left Side [facility]    Thoracic - Right Rotation [facility]  Thoracic - Left Rotation [facility]    Strength   Right Hip Extension 4-/5   Right Hip ABduction 3+/5   Left Hip Extension 4/5   Left Hip ABduction 3+/5   Palpation   Palpation comment hypomobility of sacrum and lumbar spiones, thoracic spine apearars [facility]                      [facility] Adult PT Treatment/Exercise - 09/02/15 0001    Lumbar Exercises: Quadruped   Opposite Arm/Leg Raise Right arm/Left leg;Left arm/Right leg;15 reps;5 seconds   Plank 5x 30" front plank on elbows/feet; 1x30 each side side planks; 1x15 reverse plank    Knee/Hip Exercises: Aerobic   Nustep Nustep hills 4 level 3 for LE strengthening; supervision provided due to it being patient's first time on machine                 PT Education - 09/02/15 0951    Education provided Yes   Education Details DC today; advanced HEP only consists of regularly getting to gym, at least 3x/week for 30 minutes, for cardio and strength- advised to get with personal trainer  for first 1-2 sessions    Person(s) Educated Patient   Methods Explanation   Comprehension Verbalized understanding          PT Short Term Goals - 09/02/15 0914    PT SHORT TERM GOAL #1   Title Patient will demosntrate presently limited motions in Lumbar and Thoracic spines to be [facility] in order to assist in improving overall posture and mobility, and to reduce pain    Baseline 4/25- generally WFL lumbar and thoracic spines    Time 3   Period Weeks   Status Achieved   PT SHORT TERM GOAL #2   Title Patient to demonstrate  improved gait with improved mobility of rotation of spine and pelvis during gait, improved posture during gait, no signs of LLD, and midback pain 0/10 during gait in order to enhance overall pain free function    Baseline 4/25- improved rotation, possible step down however LLD neutral in supine. Pain 0/10 when she is walking.    Time 3   Period Weeks   Status Partially Met   PT SHORT TERM GOAL #3   Title Patient to demonstrate improved posture at least 75% of the time with improvements in spinal curvature in order to assist in reducing pain and improving general function    Baseline 4/25- reports she is doing "OK" with this    Time 3   Period Weeks   Status On-going   PT SHORT TERM GOAL #4   Title Patient to be independent in correctly and consistently performing appripriate HEP, to be updated PRN    Baseline 4/25- reports she is doing it every day, "a lot has been added to me"    Time 3   Period Weeks   Status Achieved           PT Long Term Goals - 09/02/15 9381    PT LONG TERM GOAL #1   Title Patient will demonstrate strength at least 4/5 in all tested muscle groups and will demonstrate an improvement of at least 75 seconds in endurance of trunk flexors and extensors in order to enhance function and assist in reducing pain    Baseline 4/25- improving but remains weak    Time 6   Period Weeks   Status On-going   PT LONG TERM GOAL #2   Title  Patient to  demonstrate no LLD for at least the past 3 weeks, and will be in dependent in self-correction techniques in order to assist in management of condition and general pain relief    Baseline 4/25- even    Time 6   Period Weeks   Status Achieved   PT LONG TERM GOAL #3   Title Patient to report she has been able to complete at least 1/2 shift at work with thoracic pain 0/10, good posture throughout, in order to assist in enhancing and promoting painfree function in community    Baseline 4/25- reports she can do this    Time 6   Period Weeks   Status Achieved   PT LONG TERM GOAL #4   Title Patient to report she has been able to play at least 5-10 minutes of basketball at least 3 times per week with thoracic spine pain no more than 2/10 in order to assist in returning to PLOF and promoting overall improved health habits    Baseline 4/25- has not attempted    Time 6   Period Weeks   Status On-going               Plan - 09/02/15 0924    Clinical Impression Statement Re-assessment performed today. Patient shows greatly improved ROM throughout her entire spine and reports she is pain free in general (except for some acute low back aching today), however does continue to demosntrate functional muscle weakness and poor posture- however strongly feel that these could be addressed by getting patient into a regular activity program on her own. At this piont patient appears able to do all she needs and wants to do, and is no longer in need of skilled PT services. Strongly educated patient regarding importance of regular physical acitivity, both cardio and strength, at regular intervals at gym.  Recommending DC today.    Rehab Potential Good   PT Treatment/Interventions ADLs/Self Care Home Management;Electrical Stimulation;Moist Heat;Gait training;Functional mobility training;Therapeutic activities;Therapeutic exercise;Neuromuscular re-education;Patient/family education;Manual  techniques;Taping;Cryotherapy   PT Next Visit Plan DC today    PT Home Exercise Plan recommended regular exercise at gym under supervision of personal trainer    Consulted and Agree with Plan of Care Patient      Patient will benefit from skilled therapeutic intervention in order to improve the following deficits and impairments:  Abnormal gait, Hypomobility, Decreased endurance, Decreased strength, Pain, Difficulty walking, Increased muscle spasms, Postural dysfunction, Impaired flexibility  Visit Diagnosis: Pain in thoracic spine  Muscle weakness (generalized)  Abnormal posture  Other symptoms and signs involving the musculoskeletal system     Problem List Patient Active Problem List   Diagnosis Date Noted  . Type I diabetes mellitus with complication, uncontrolled ([place]) 02/26/2014  . Noncompliance 02/26/2014  . Essential hypertension, benign 11/30/2012  . Acanthosis nigricans, acquired   . Overweight for pediatric patient   . Hypoglycemia associated with diabetes ([place])   . Goiter   . Tachycardia   . Diabetic autonomic neuropathy ([place])   . Diabetic peripheral neuropathy ([place])   . Thyroiditis, autoimmune   . Asthma   . Environmental allergies    PHYSICAL THERAPY DISCHARGE SUMMARY  Visits from Start of Care: 11  Current functional level related to goals / functional outcomes: Patient doing very well recently, reports she is for most part pain free and able to do what she needs and wants. Last remaining deficit is functional weakness however patient appears to be at point where she will benefit  most from regular activity at gym rather than skilled PT services. DC today.    Remaining deficits: Poor posture, functional weakness    Education / Equipment: Recommended regular exercise at gym under supervision of personal trainer  Plan: Patient agrees to discharge.  Patient goals were partially met. Patient is being discharged due to being pleased with the current  functional level.  ?????       Deniece Ree PT, DPT [place] [address] [place], [place], [address] Phone: [phone]   Fax:  [phone]  Name: JEANICE DEMPSEY MRN: [id number] Date of Birth: 1995/09/30

## 2015-09-04 ENCOUNTER — Encounter (HOSPITAL_COMMUNITY): Payer: BLUE CROSS/BLUE SHIELD | Admitting: Physical Therapy

## 2015-09-09 ENCOUNTER — Encounter (HOSPITAL_COMMUNITY): Payer: BLUE CROSS/BLUE SHIELD | Admitting: Physical Therapy

## 2015-09-11 ENCOUNTER — Encounter (HOSPITAL_COMMUNITY): Payer: BLUE CROSS/BLUE SHIELD | Admitting: Physical Therapy

## 2015-11-03 ENCOUNTER — Emergency Department (HOSPITAL_COMMUNITY)
Admission: EM | Admit: 2015-11-03 | Discharge: 2015-11-03 | Disposition: A | Discharge status: Home / Self Care | Payer: BLUE CROSS/BLUE SHIELD | Attending: Emergency Medicine | Admitting: Emergency Medicine

## 2015-11-03 ENCOUNTER — Emergency Department (HOSPITAL_COMMUNITY): Payer: BLUE CROSS/BLUE SHIELD

## 2015-11-03 ENCOUNTER — Encounter (HOSPITAL_COMMUNITY): Payer: Self-pay | Admitting: Emergency Medicine

## 2015-11-03 DIAGNOSIS — J45909 Unspecified asthma, uncomplicated: Secondary | ICD-10-CM | POA: Insufficient documentation

## 2015-11-03 DIAGNOSIS — Z791 Long term (current) use of non-steroidal anti-inflammatories (NSAID): Secondary | ICD-10-CM | POA: Insufficient documentation

## 2015-11-03 DIAGNOSIS — Z79899 Other long term (current) drug therapy: Secondary | ICD-10-CM | POA: Diagnosis not present

## 2015-11-03 DIAGNOSIS — E1065 Type 1 diabetes mellitus with hyperglycemia: Secondary | ICD-10-CM | POA: Insufficient documentation

## 2015-11-03 DIAGNOSIS — R109 Unspecified abdominal pain: Secondary | ICD-10-CM | POA: Diagnosis present

## 2015-11-03 DIAGNOSIS — R197 Diarrhea, unspecified: Secondary | ICD-10-CM | POA: Insufficient documentation

## 2015-11-03 DIAGNOSIS — Z794 Long term (current) use of insulin: Secondary | ICD-10-CM | POA: Diagnosis not present

## 2015-11-03 DIAGNOSIS — R739 Hyperglycemia, unspecified: Secondary | ICD-10-CM

## 2015-11-03 LAB — CBC WITH DIFFERENTIAL/PLATELET
BASOS ABS: 0 10*3/uL (ref 0.0–0.1)
BASOS PCT: 0 %
EOS PCT: 2 %
Eosinophils Absolute: 0.1 10*3/uL (ref 0.0–0.7)
HEMATOCRIT: 41.3 % (ref 36.0–46.0)
Hemoglobin: 14.6 g/dL (ref 12.0–15.0)
LYMPHS PCT: 29 %
Lymphs Abs: 2.2 10*3/uL (ref 0.7–4.0)
MCH: 29.7 pg (ref 26.0–34.0)
MCHC: 35.4 g/dL (ref 30.0–36.0)
MCV: 83.9 fL (ref 78.0–100.0)
Monocytes Absolute: 0.4 10*3/uL (ref 0.1–1.0)
Monocytes Relative: 5 %
NEUTROS ABS: 4.8 10*3/uL (ref 1.7–7.7)
Neutrophils Relative %: 64 %
PLATELETS: 211 10*3/uL (ref 150–400)
RBC: 4.92 MIL/uL (ref 3.87–5.11)
RDW: 11.9 % (ref 11.5–15.5)
WBC: 7.6 10*3/uL (ref 4.0–10.5)

## 2015-11-03 LAB — URINE MICROSCOPIC-ADD ON: RBC / HPF: NONE SEEN RBC/hpf (ref 0–5)

## 2015-11-03 LAB — URINALYSIS, ROUTINE W REFLEX MICROSCOPIC
Bilirubin Urine: NEGATIVE
Hgb urine dipstick: NEGATIVE
KETONES UR: NEGATIVE mg/dL
LEUKOCYTES UA: NEGATIVE
NITRITE: NEGATIVE
PROTEIN: NEGATIVE mg/dL
Specific Gravity, Urine: 1.01 (ref 1.005–1.030)
pH: 5.5 (ref 5.0–8.0)

## 2015-11-03 LAB — COMPREHENSIVE METABOLIC PANEL
ALT: 36 U/L (ref 14–54)
AST: 28 U/L (ref 15–41)
Albumin: 4.1 g/dL (ref 3.5–5.0)
Alkaline Phosphatase: 150 U/L — ABNORMAL HIGH (ref 38–126)
Anion gap: 10 (ref 5–15)
BILIRUBIN TOTAL: 0.9 mg/dL (ref 0.3–1.2)
BUN: 9 mg/dL (ref 6–20)
CHLORIDE: 92 mmol/L — AB (ref 101–111)
CO2: 27 mmol/L (ref 22–32)
Calcium: 9.1 mg/dL (ref 8.9–10.3)
Creatinine, Ser: 0.56 mg/dL (ref 0.44–1.00)
Glucose, Bld: 469 mg/dL — ABNORMAL HIGH (ref 65–99)
POTASSIUM: 3.8 mmol/L (ref 3.5–5.1)
Sodium: 129 mmol/L — ABNORMAL LOW (ref 135–145)
TOTAL PROTEIN: 7.3 g/dL (ref 6.5–8.1)

## 2015-11-03 LAB — I-STAT BETA HCG BLOOD, ED (MC, WL, AP ONLY): I-stat hCG, quantitative: <5 m[IU]/mL (ref <5)

## 2015-11-03 LAB — CBG MONITORING, ED
GLUCOSE-CAPILLARY: 150 mg/dL — AB (ref 65–99)
Glucose-Capillary: 467 mg/dL — ABNORMAL HIGH (ref 65–99)

## 2015-11-03 LAB — LIPASE, BLOOD: LIPASE: 39 U/L (ref 11–51)

## 2015-11-03 MED ORDER — SODIUM CHLORIDE 0.9 % IV SOLN
INTRAVENOUS | Status: DC
  Administered 2015-11-03: 18:00:00 via INTRAVENOUS

## 2015-11-03 MED ORDER — IOPAMIDOL (ISOVUE-300) INJECTION 61%
100.0000 mL | Freq: Once | INTRAVENOUS | Status: AC | PRN
  Administered 2015-11-03: 100 mL via INTRAVENOUS

## 2015-11-03 MED ORDER — SODIUM CHLORIDE 0.9 % IV BOLUS (SEPSIS)
500.0000 mL | Freq: Once | INTRAVENOUS | Status: AC
  Administered 2015-11-03: 500 mL via INTRAVENOUS

## 2015-11-03 MED ORDER — ONDANSETRON HCL 4 MG/2ML IJ SOLN
4.0000 mg | Freq: Once | INTRAMUSCULAR | Status: AC
  Administered 2015-11-03: 4 mg via INTRAVENOUS
  Filled 2015-11-03: qty 2

## 2015-11-03 MED ORDER — INSULIN ASPART 100 UNIT/ML ~~LOC~~ SOLN
10.0000 [IU] | Freq: Once | SUBCUTANEOUS | Status: AC
  Administered 2015-11-03: 10 [IU] via INTRAVENOUS
  Filled 2015-11-03: qty 1

## 2015-11-03 NOTE — ED Notes (Signed)
Patient complaining of diarrhea x 2 months. States abdominal pain started one week ago.

## 2015-11-03 NOTE — Discharge Instructions (Signed)
Today CT of the abdomen and pelvis without any acute findings. Recommend follow-up with GI medicine for consideration for colonoscopy. Make an appointment to follow-up with your primary care doctor. Return for any new or worse symptoms.

## 2015-11-03 NOTE — ED Provider Notes (Addendum)
CSN: [id number]     Arrival date & time 11/03/15  1711 History   First MD Initiated Contact with Patient 11/03/15 1731     Chief Complaint  Patient presents with  . Abdominal Pain  . Diarrhea     (Consider location/radiation/quality/duration/timing/severity/associated sxs/prior Treatment) The history is provided by the patient and a parent.  20 year old female with juvenile onset diabetes. Patient with history of difficulty controlling blood sugars and noncompliance. Patient presents here stating she had abdominal pain diffusely for a week and has had diarrhea for 2 months. States that going about 5 times a day. No blood in the bowel movements. No fevers. Has not followed up with primary care doctor about it. The abdominal pain is diffuse 5 out of 10.  Past Medical History  Diagnosis Date  . Type 1 diabetes mellitus in patient age 18-19 years with HbA1C goal below 7.5   . Acanthosis nigricans, acquired   . Overweight for pediatric patient   . Hypoglycemia associated with diabetes ([place])   . Goiter   . Tachycardia   . Diabetic autonomic neuropathy ([place])   . Diabetic peripheral neuropathy ([place])   . Thyroiditis, autoimmune   . Asthma   . Environmental allergies    History reviewed. No pertinent past surgical history. Family History  Problem Relation Age of Onset  . Diabetes Mother     Type II DM  . Thyroid disease Mother   . Diabetes Maternal Grandmother     Type II DM  . Diabetes Cousin     Type II DM   Social History  Substance Use Topics  . Smoking status: Never Smoker   . Smokeless tobacco: Never Used  . Alcohol Use: No   OB History    No data available     Review of Systems  Constitutional: Negative for fever.  HENT: Negative for congestion.   Eyes: Negative for visual disturbance.  Respiratory: Negative for shortness of breath.   Cardiovascular: Negative for chest pain.  Gastrointestinal: Positive for abdominal pain and diarrhea. Negative for nausea and  vomiting.  Genitourinary: Negative for dysuria.  Musculoskeletal: Negative for back pain.  Skin: Negative for rash.  Neurological: Negative for headaches.  Hematological: Does not bruise/bleed easily.  Psychiatric/Behavioral: Negative for confusion.      Allergies  Review of patient's allergies indicates no known allergies.  Home Medications   Prior to Admission medications   Medication Sig Start Date End Date Taking? Authorizing Provider  ibuprofen (ADVIL,MOTRIN) 200 MG tablet Take 400 mg by mouth every 6 (six) hours as needed for moderate pain.   Yes Historical Provider, MD  insulin aspart (NOVOLOG FLEXPEN) 100 UNIT/ML FlexPen Inject 8-14 Units into the skin 3 (three) times daily with meals. 06/20/15  Yes Cassandria Anger, MD  insulin glargine (LANTUS) 100 UNIT/ML injection Inject 16 Units into the skin at bedtime.   Yes Historical Provider, MD  loperamide (ANTI-DIARRHEAL) 2 MG capsule Take 2 mg by mouth as needed for diarrhea or loose stools.   Yes Historical Provider, MD  Blood Glucose Monitoring Suppl (ACCU-CHEK AVIVA) device Use as instructed 06/03/15   Cassandria Anger, MD  glucagon 1 MG injection Follow package directions for low blood sugar. 01/05/12   Sherrlyn Hock, MD  glucose blood (ACCU-CHEK AVIVA) test strip Use to test 4 times a day 06/03/15   Cassandria Anger, MD   BP 118/83 mmHg  Pulse 127  Temp(Src) 98 F (36.7 C) (Oral)  Resp 14  Ht 5'  9" (1.753 m)  Wt 47.628 kg  BMI 15.50 kg/m2  SpO2 100%  LMP 10/09/2015 Physical Exam  Constitutional: She is oriented to person, place, and time.  [place] thin.  HENT:  Head: Normocephalic and atraumatic.  Mucous membranes dry.  Eyes: Conjunctivae and EOM are normal. Pupils are equal, round, and reactive to light.  Neck: Normal range of motion. Neck supple.  Cardiovascular:  Tachycardic.  Pulmonary/Chest: Effort normal and breath sounds normal. No respiratory distress.  Abdominal: Soft. Bowel sounds are  normal. She exhibits no distension. There is no tenderness.  Musculoskeletal: Normal range of motion. She exhibits no edema.  Neurological: She is alert and oriented to person, place, and time. No cranial nerve deficit. She exhibits normal muscle tone. Coordination normal.  Skin: No erythema.  Nursing note and vitals reviewed.   ED Course  Procedures (including critical care time) Labs Review Labs Reviewed  COMPREHENSIVE METABOLIC PANEL - Abnormal; Notable for the following:    Sodium 129 (*)    Chloride 92 (*)    Glucose, Bld 469 (*)    Alkaline Phosphatase 150 (*)    All other components within normal limits  URINALYSIS, ROUTINE W REFLEX MICROSCOPIC (NOT AT [facility]) - Abnormal; Notable for the following:    Color, Urine STRAW (*)    Glucose, UA >1000 (*)    All other components within normal limits  URINE MICROSCOPIC-ADD ON - Abnormal; Notable for the following:    Squamous Epithelial / LPF 0-5 (*)    Bacteria, UA RARE (*)    All other components within normal limits  CBG MONITORING, ED - Abnormal; Notable for the following:    Glucose-Capillary 467 (*)    All other components within normal limits  CBG MONITORING, ED - Abnormal; Notable for the following:    Glucose-Capillary 150 (*)    All other components within normal limits  LIPASE, BLOOD  CBC WITH DIFFERENTIAL/PLATELET  I-STAT BETA HCG BLOOD, ED ([facility], [facility], [facility] ONLY)  CBG MONITORING, ED   Results for orders placed or performed during the hospital encounter of 11/03/15  Comprehensive metabolic panel  Result Value Ref Range   Sodium 129 (L) 135 - 145 mmol/L   Potassium 3.8 3.5 - 5.1 mmol/L   Chloride 92 (L) 101 - 111 mmol/L   CO2 27 22 - 32 mmol/L   Glucose, Bld 469 (H) 65 - 99 mg/dL   BUN 9 6 - 20 mg/dL   Creatinine, Ser 0.56 0.44 - 1.00 mg/dL   Calcium 9.1 8.9 - 10.3 mg/dL   Total Protein 7.3 6.5 - 8.1 g/dL   Albumin 4.1 3.5 - 5.0 g/dL   AST 28 15 - 41 U/L   ALT 36 14 - 54 U/L   Alkaline Phosphatase 150 (H) 38 - 126  U/L   Total Bilirubin 0.9 0.3 - 1.2 mg/dL   GFR calc non Af Amer >60 >60 mL/min   GFR calc Af Amer >60 >60 mL/min   Anion gap 10 5 - 15  Lipase, blood  Result Value Ref Range   Lipase 39 11 - 51 U/L  CBC with Differential/Platelet  Result Value Ref Range   WBC 7.6 4.0 - 10.5 K/uL   RBC 4.92 3.87 - 5.11 MIL/uL   Hemoglobin 14.6 12.0 - 15.0 g/dL   HCT 41.3 36.0 - 46.0 %   MCV 83.9 78.0 - 100.0 fL   MCH 29.7 26.0 - 34.0 pg   MCHC 35.4 30.0 - 36.0 g/dL   RDW 11.9  11.5 - 15.5 %   Platelets 211 150 - 400 K/uL   Neutrophils Relative % 64 %   Neutro Abs 4.8 1.7 - 7.7 K/uL   Lymphocytes Relative 29 %   Lymphs Abs 2.2 0.7 - 4.0 K/uL   Monocytes Relative 5 %   Monocytes Absolute 0.4 0.1 - 1.0 K/uL   Eosinophils Relative 2 %   Eosinophils Absolute 0.1 0.0 - 0.7 K/uL   Basophils Relative 0 %   Basophils Absolute 0.0 0.0 - 0.1 K/uL  Urinalysis, Routine w reflex microscopic (not at [facility])  Result Value Ref Range   Color, Urine STRAW (A) YELLOW   APPearance CLEAR CLEAR   Specific Gravity, Urine 1.010 1.005 - 1.030   pH 5.5 5.0 - 8.0   Glucose, UA >1000 (A) NEGATIVE mg/dL   Hgb urine dipstick NEGATIVE NEGATIVE   Bilirubin Urine NEGATIVE NEGATIVE   Ketones, ur NEGATIVE NEGATIVE mg/dL   Protein, ur NEGATIVE NEGATIVE mg/dL   Nitrite NEGATIVE NEGATIVE   Leukocytes, UA NEGATIVE NEGATIVE  Urine microscopic-add on  Result Value Ref Range   Squamous Epithelial / LPF 0-5 (A) NONE SEEN   WBC, UA 0-5 0 - 5 WBC/hpf   RBC / HPF NONE SEEN 0 - 5 RBC/hpf   Bacteria, UA RARE (A) NONE SEEN   Urine-Other YEAST PRESENT   POC CBG, ED  Result Value Ref Range   Glucose-Capillary 467 (H) 65 - 99 mg/dL  I-Stat Beta hCG blood, ED ([facility], [facility], [facility] only)  Result Value Ref Range   I-stat hCG, quantitative <5.0 <5 mIU/mL   Comment 3          POC CBG, ED  Result Value Ref Range   Glucose-Capillary 150 (H) 65 - 99 mg/dL     Imaging Review Ct Abdomen Pelvis W Contrast  11/03/2015  CLINICAL DATA:  Diarrhea  for 2 months. Abdominal pain starting 1 week ago. EXAM: CT ABDOMEN AND PELVIS WITH CONTRAST TECHNIQUE: Multidetector CT imaging of the abdomen and pelvis was performed using the standard protocol following bolus administration of intravenous contrast. CONTRAST:  130mL ISOVUE-300 IOPAMIDOL (ISOVUE-300) INJECTION 61% COMPARISON:  Abdominal ultrasound dated 09/07/2012 FINDINGS: Lower chest:  Unremarkable Hepatobiliary: Minimally contracted gallbladder likely due to the medial in the stomach. Pancreas: Abnormal atrophy of the pancreatic parenchyma, with punctate calcifications in the pancreatic head, and with a 4.4 by 2.6 by 2.6 cm cystic lesion along the pancreatic tail. There also a few punctate calcifications within along the pancreatic tail. Spleen: Unremarkable Adrenals/Urinary Tract: The ureters are difficult to follow due to the paucity of fat planes in the abdomen. No hydronephrosis or urinary tract calculi identified. Urinary bladder unremarkable. Stomach/Bowel: There is some accentuation of folds and mild nodularity in the terminal ileum which tapers as it approaches the cecum. This appearance is shown on images 36 through 45 series 4 the appendix is not well visualized. Vascular/Lymphatic: Unremarkable Reproductive: Small amount of free pelvic fluid. Uterus unremarkable. Rim enhancing structure along the left posterior margin of the uterus, 2.5 by 1.5 by 2.1 cm on image 71 series 2, quite likely related to the left ovary although the ovary itself is somewhat indistinct. Other: Small amount of free pelvic fluid. Musculoskeletal: Unremarkable IMPRESSION: 1. There is some nodular thickening of the terminal ileum as it approaches the cecum, with tapering contour. Although this could be from low-level inflammation or even benign lymphoid prominence, cannot exclude Crohn's disease/terminal ileitis. 2. The appendix is not well seen. 3. Small amount of free pelvic  fluid, nonspecific. 4. Ring-enhancing structure along  the left posterior margin of the uterus, probably related to the otherwise indistinct left ovary. The patient had a negative pregnancy test and accordingly this is not thought to represent an ectopic pregnancy ; most likely it represents a corpus luteum. 5. Atrophic pancreas, with scattered calcifications compatible with chronic pancreatitis. 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 Electronically Signed   By: Van Clines M.D.   On: 11/03/2015 20:39   I have personally reviewed and evaluated these images and lab results as part of my medical decision-making.   EKG Interpretation None      MDM   Final diagnoses:  Hyperglycemia  Diarrhea, unspecified type    Patient's workup here without any significant findings on CT scan. There was some questionable terminal ileitis follow-up with GI medicine and colonoscopy would be recommended. No evidence of any diffuse colitis. No evidence to explain the diarrhea. Blood sugars were poorly controlled. Improved with IV insulin here. Patient will need close follow-up with the primary care doctor as well as referral to GI medicine.  Patient given 10 units of IV regular insulin here blood sugar down below 200.  Fredia Sorrow, MD 11/03/15 [id number]  Fredia Sorrow, MD 11/03/15 2226

## 2015-11-04 ENCOUNTER — Ambulatory Visit: Payer: Medicaid Other | Admitting: "Endocrinology

## 2015-11-04 ENCOUNTER — Telehealth: Payer: Self-pay | Admitting: "Endocrinology

## 2015-11-04 MED ORDER — INSULIN GLARGINE 100 UNIT/ML ~~LOC~~ SOLN: 16.0000 [IU] | Freq: Every day | SUBCUTANEOUS | Status: DC

## 2015-11-04 MED ORDER — INSULIN ASPART 100 UNIT/ML FLEXPEN: 8.0000 [IU] | PEN_INJECTOR | Freq: Three times a day (TID) | SUBCUTANEOUS | Status: DC

## 2015-11-04 NOTE — Telephone Encounter (Signed)
pt called to see if we could refill her novalog and lantus

## 2015-11-05 ENCOUNTER — Other Ambulatory Visit: Payer: Self-pay | Admitting: "Endocrinology

## 2015-11-05 LAB — BASIC METABOLIC PANEL
BUN: 7 mg/dL (ref 7–25)
CO2: 29 mmol/L (ref 20–31)
CREATININE: 0.47 mg/dL — AB (ref 0.50–1.10)
Calcium: 8.7 mg/dL (ref 8.6–10.2)
Chloride: 98 mmol/L (ref 98–110)
Glucose, Bld: 244 mg/dL — ABNORMAL HIGH (ref 65–99)
Potassium: 3.8 mmol/L (ref 3.5–5.3)
SODIUM: 136 mmol/L (ref 135–146)

## 2015-11-05 LAB — LIPID PANEL
Cholesterol: 147 mg/dL (ref 125–200)
HDL: 75 mg/dL (ref >=46)
LDL CALC: 60 mg/dL (ref <130)
TRIGLYCERIDES: 62 mg/dL (ref <150)
Total CHOL/HDL Ratio: 2 Ratio (ref <=5.0)
VLDL: 12 mg/dL (ref <30)

## 2015-11-05 LAB — TSH: TSH: 0.55 m[IU]/L

## 2015-11-05 LAB — T4, FREE: Free T4: 1.4 ng/dL (ref 0.8–1.8)

## 2015-11-06 LAB — HEMOGLOBIN A1C
Hgb A1c MFr Bld: 13.4 % — ABNORMAL HIGH (ref <5.7)
MEAN PLASMA GLUCOSE: 338 mg/dL

## 2015-11-12 ENCOUNTER — Ambulatory Visit: Payer: Medicaid Other | Admitting: "Endocrinology

## 2015-11-17 ENCOUNTER — Ambulatory Visit (INDEPENDENT_AMBULATORY_CARE_PROVIDER_SITE_OTHER): Payer: BLUE CROSS/BLUE SHIELD | Admitting: Family Medicine

## 2015-11-17 ENCOUNTER — Encounter: Payer: Self-pay | Admitting: Internal Medicine

## 2015-11-17 ENCOUNTER — Encounter: Payer: Self-pay | Admitting: Family Medicine

## 2015-11-17 VITALS — BP 118/68 | Ht 67.0 in | Wt 97.2 lb

## 2015-11-17 DIAGNOSIS — E109 Type 1 diabetes mellitus without complications: Secondary | ICD-10-CM | POA: Diagnosis not present

## 2015-11-17 DIAGNOSIS — K529 Noninfective gastroenteritis and colitis, unspecified: Secondary | ICD-10-CM | POA: Diagnosis not present

## 2015-11-17 DIAGNOSIS — R634 Abnormal weight loss: Secondary | ICD-10-CM | POA: Diagnosis not present

## 2015-11-17 NOTE — Progress Notes (Signed)
   Subjective:    Patient ID: [name], female    DOB: Jul 07, 1995, 20 y.o.   MRN: [id number]  HPI Patient is here today for ER follow up visit. Patient was seen at [facility] ER on 11/03/15 for hyperglycemia and diarrhea. Patient needs a referral to a gastroenterologist for her diarrhea.cramping and pain with it  [id number]  Patient states that she may be losing weight because of her thyroid.  Patient has history of insulin-dependent diabetes. Claims she has lost weight primarily because of thyroid. However review recent blood work shows thyroid was in good limits and sugar is always extremely high. Discussed with patient along with importance of seeing diabetes specialist in this regard.  Loose stools and diarrhea off and on now for months. Abdominal pain diffuse in nature generally cramping comes and goes. No real reflux symptoms. Wonder if related to patient's elevated sugar.  Review of Systems No headache no chest pain no back pain no change in bowel habits    Objective:   Physical Exam Alert substantial weight loss present. Good blood pressure HEENT normal lungs clear heart rare rhythm abdomen scaphoid no discrete tenderness good bowel sounds no masses       Assessment & Plan:  Impression weight loss concerning, strongly encouraged to get back with her endocrinologist. Likely weight loss is due to #2 #2 very poorly controlled diabetes #3 chronic abdominal symptoms with diarrhea and intermittent pain plan GI referral. Encouraged to get back with endocrinologist. Multiple questions answered. 25 minutes spent most in discussion

## 2015-11-19 ENCOUNTER — Encounter: Payer: Self-pay | Admitting: Family Medicine

## 2015-11-26 ENCOUNTER — Ambulatory Visit: Payer: Medicaid Other | Admitting: "Endocrinology

## 2015-12-15 ENCOUNTER — Encounter: Payer: Self-pay | Admitting: Gastroenterology

## 2015-12-15 ENCOUNTER — Ambulatory Visit: Payer: BLUE CROSS/BLUE SHIELD | Admitting: Gastroenterology

## 2015-12-15 ENCOUNTER — Telehealth: Payer: Self-pay | Admitting: Gastroenterology

## 2015-12-15 NOTE — Telephone Encounter (Signed)
PT WAS A NO SHOW AND LETTER SENT  °

## 2016-02-02 ENCOUNTER — Emergency Department (HOSPITAL_COMMUNITY)
Admission: EM | Admit: 2016-02-02 | Discharge: 2016-02-02 | Discharge status: Against Medical Advice | Payer: BLUE CROSS/BLUE SHIELD | Attending: Dermatology | Admitting: Dermatology

## 2016-02-02 ENCOUNTER — Encounter (HOSPITAL_COMMUNITY): Payer: Self-pay | Admitting: Emergency Medicine

## 2016-02-02 DIAGNOSIS — J45909 Unspecified asthma, uncomplicated: Secondary | ICD-10-CM | POA: Diagnosis not present

## 2016-02-02 DIAGNOSIS — R197 Diarrhea, unspecified: Secondary | ICD-10-CM | POA: Diagnosis not present

## 2016-02-02 DIAGNOSIS — E109 Type 1 diabetes mellitus without complications: Secondary | ICD-10-CM | POA: Insufficient documentation

## 2016-02-02 DIAGNOSIS — Z5321 Procedure and treatment not carried out due to patient leaving prior to being seen by health care provider: Secondary | ICD-10-CM | POA: Diagnosis not present

## 2016-02-02 NOTE — ED Notes (Signed)
Called pt in lobby to take to treatment room, no response

## 2016-02-02 NOTE — ED Notes (Signed)
Called pt in lobby to take to treatment room. No response

## 2016-02-02 NOTE — ED Triage Notes (Signed)
Pt here with c/o diarrhea. States she has been having for past couple of months and has went at least 5 times today.

## 2016-02-27 ENCOUNTER — Telehealth: Payer: Self-pay | Admitting: Family Medicine

## 2016-02-27 DIAGNOSIS — E119 Type 2 diabetes mellitus without complications: Secondary | ICD-10-CM

## 2016-02-27 NOTE — Telephone Encounter (Signed)
Let's do 

## 2016-02-27 NOTE — Telephone Encounter (Signed)
Pt is requesting a referral to Dr. Lorre Nick at [place] [facility]. The phone number is [phone] and the fax number is [phone]. Please advise.

## 2016-03-01 NOTE — Telephone Encounter (Signed)
Referral in system

## 2016-03-04 ENCOUNTER — Telehealth: Payer: Self-pay | Admitting: Family Medicine

## 2016-03-04 NOTE — Telephone Encounter (Signed)
LMOVM - doc requested for endocrin referral no longer at that practice Need to know if pt is willing to see anyone else at same clinic or to refer elsewhere

## 2016-03-18 ENCOUNTER — Encounter: Payer: Self-pay | Admitting: Family Medicine

## 2016-03-26 ENCOUNTER — Emergency Department (HOSPITAL_COMMUNITY)
Admission: EM | Admit: 2016-03-26 | Discharge: 2016-03-26 | Disposition: A | Discharge status: Home / Self Care | Payer: BLUE CROSS/BLUE SHIELD | Attending: Emergency Medicine | Admitting: Emergency Medicine

## 2016-03-26 ENCOUNTER — Encounter (HOSPITAL_COMMUNITY): Payer: Self-pay | Admitting: Emergency Medicine

## 2016-03-26 DIAGNOSIS — J45909 Unspecified asthma, uncomplicated: Secondary | ICD-10-CM | POA: Insufficient documentation

## 2016-03-26 DIAGNOSIS — R197 Diarrhea, unspecified: Secondary | ICD-10-CM | POA: Diagnosis not present

## 2016-03-26 DIAGNOSIS — E1040 Type 1 diabetes mellitus with diabetic neuropathy, unspecified: Secondary | ICD-10-CM | POA: Diagnosis not present

## 2016-03-26 DIAGNOSIS — R109 Unspecified abdominal pain: Secondary | ICD-10-CM

## 2016-03-26 LAB — URINALYSIS, ROUTINE W REFLEX MICROSCOPIC
Bilirubin Urine: NEGATIVE
Hgb urine dipstick: NEGATIVE
Ketones, ur: NEGATIVE mg/dL
LEUKOCYTES UA: NEGATIVE
Nitrite: NEGATIVE
PH: 5.5 (ref 5.0–8.0)
Protein, ur: NEGATIVE mg/dL
Specific Gravity, Urine: 1.041 — ABNORMAL HIGH (ref 1.005–1.030)

## 2016-03-26 LAB — POC URINE PREG, ED: Preg Test, Ur: NEGATIVE

## 2016-03-26 LAB — COMPREHENSIVE METABOLIC PANEL
ALT: 47 U/L (ref 14–54)
AST: 65 U/L — AB (ref 15–41)
Albumin: 3.4 g/dL — ABNORMAL LOW (ref 3.5–5.0)
Alkaline Phosphatase: 141 U/L — ABNORMAL HIGH (ref 38–126)
Anion gap: 10 (ref 5–15)
BUN: 8 mg/dL (ref 6–20)
CHLORIDE: 96 mmol/L — AB (ref 101–111)
CO2: 28 mmol/L (ref 22–32)
Calcium: 9.1 mg/dL (ref 8.9–10.3)
Creatinine, Ser: 0.53 mg/dL (ref 0.44–1.00)
Glucose, Bld: 450 mg/dL — ABNORMAL HIGH (ref 65–99)
POTASSIUM: 3.8 mmol/L (ref 3.5–5.1)
Sodium: 134 mmol/L — ABNORMAL LOW (ref 135–145)
Total Bilirubin: 0.6 mg/dL (ref 0.3–1.2)
Total Protein: 6.6 g/dL (ref 6.5–8.1)

## 2016-03-26 LAB — CBC
HEMATOCRIT: 34.8 % — AB (ref 36.0–46.0)
Hemoglobin: 12.2 g/dL (ref 12.0–15.0)
MCH: 28.6 pg (ref 26.0–34.0)
MCHC: 35.1 g/dL (ref 30.0–36.0)
MCV: 81.5 fL (ref 78.0–100.0)
Platelets: 187 10*3/uL (ref 150–400)
RBC: 4.27 MIL/uL (ref 3.87–5.11)
RDW: 11.9 % (ref 11.5–15.5)
WBC: 7.8 10*3/uL (ref 4.0–10.5)

## 2016-03-26 LAB — URINE MICROSCOPIC-ADD ON

## 2016-03-26 LAB — LIPASE, BLOOD: LIPASE: 51 U/L (ref 11–51)

## 2016-03-26 MED ORDER — DICYCLOMINE HCL 20 MG PO TABS: 20.0000 mg | ORAL_TABLET | Freq: Two times a day (BID) | ORAL | 1 refills | Status: DC

## 2016-03-26 MED ORDER — SODIUM CHLORIDE 0.9 % IV BOLUS (SEPSIS)
1000.0000 mL | Freq: Once | INTRAVENOUS | Status: AC
  Administered 2016-03-26: 1000 mL via INTRAVENOUS

## 2016-03-26 NOTE — ED Triage Notes (Signed)
Patient states abdominal pain x 6 or 7 months.   Patient states she has had diarrhea for the same amount of time.  Patient states she had been to GI doc approximately 1 month ago, but states "he didn't tell me anything".  Patient denies N/V.   Patient states she thinks she needs an MRI now.   Denies other symptoms.

## 2016-03-26 NOTE — ED Notes (Signed)
Pt ambulatory at discharge with no questions. Family driving pt. Home. Denies pain, VSS, NAD.

## 2016-03-26 NOTE — Discharge Instructions (Signed)
I'm unsure of the cause or symptoms at this time. I think that 2 possibilities are some type of food allergy and this is why I discussed you keeping a food and bowel movement diary to see if he can find any associations. He cannot I would suggest following up with an allergist as directed above. I will also try to start you on some Bentyl to help you with your symptoms.  You have had multiple episodes of high blood sugar I would make sure that your endocrinologist is aware of this as you may need some insulin adjustments. I would also make phone call to Dr. Michail Sermon for any further recommendations that he may have and it may be helpful to take a stool sample and to be analyzed.

## 2016-03-26 NOTE — ED Provider Notes (Signed)
[facility] DEPT Provider Note   CSN: [id number] Arrival date & time: 03/26/16  1138     History   Chief Complaint Chief Complaint  Patient presents with  . Abdominal Pain    HPI Lindsey Murillo is a 20 y.o. female.   Diarrhea   This is a chronic problem. The current episode started more than 1 week ago. The problem occurs 2 to 4 times per day. The problem has not changed since onset.The stool consistency is described as watery. There has been no fever. Associated symptoms include abdominal pain. Pertinent negatives include no vomiting, no chills and no arthralgias. She has tried nothing for the symptoms.    Past Medical History:  Diagnosis Date  . Acanthosis nigricans, acquired   . Asthma   . Diabetic autonomic neuropathy ([place])   . Diabetic peripheral neuropathy ([place])   . Environmental allergies   . Goiter   . Hypoglycemia associated with diabetes ([place])   . Overweight for pediatric patient   . Tachycardia   . Thyroiditis, autoimmune   . Type 1 diabetes mellitus in patient age 50-19 years with HbA1C goal below 7.5     Patient Active Problem List   Diagnosis Date Noted  . Type I diabetes mellitus with complication, uncontrolled ([place]) 02/26/2014  . Noncompliance 02/26/2014  . Essential hypertension, benign 11/30/2012  . Acanthosis nigricans, acquired   . Overweight for pediatric patient   . Hypoglycemia associated with diabetes ([place])   . Goiter   . Tachycardia   . Diabetic autonomic neuropathy ([place])   . Diabetic peripheral neuropathy ([place])   . Thyroiditis, autoimmune   . Asthma   . Environmental allergies     History reviewed. No pertinent surgical history.  OB History    Gravida Para Term Preterm AB Living   1             SAB TAB Ectopic Multiple Live Births                   Home Medications    Prior to Admission medications   Medication Sig Start Date End Date Taking? Authorizing Provider  albuterol (PROVENTIL HFA;VENTOLIN HFA) 108 (90  Base) MCG/ACT inhaler Inhale 1 puff into the lungs every 6 (six) hours as needed for wheezing or shortness of breath.   Yes Historical Provider, MD  Blood Glucose Monitoring Suppl (ACCU-CHEK AVIVA) device Use as instructed 06/03/15  Yes Cassandria Anger, MD  glucagon 1 MG injection Follow package directions for low blood sugar. 01/05/12  Yes Sherrlyn Hock, MD  ibuprofen (ADVIL,MOTRIN) 200 MG tablet Take 400 mg by mouth every 6 (six) hours as needed for moderate pain.   Yes Historical Provider, MD  insulin aspart (NOVOLOG FLEXPEN) 100 UNIT/ML FlexPen Inject 8-14 Units into the skin 3 (three) times daily with meals. 11/04/15  Yes Cassandria Anger, MD  insulin glargine (LANTUS) 100 UNIT/ML injection Inject 0.16 mLs (16 Units total) into the skin at bedtime. 11/04/15  Yes Cassandria Anger, MD  dicyclomine (BENTYL) 20 MG tablet Take 1 tablet (20 mg total) by mouth 2 (two) times daily. 03/26/16 04/25/16  Merrily Pew, MD    Family History Family History  Problem Relation Age of Onset  . Diabetes Mother     Type II DM  . Thyroid disease Mother   . Diabetes Maternal Grandmother     Type II DM  . Diabetes Cousin     Type II DM    Social History Social  History  Substance Use Topics  . Smoking status: Never Smoker  . Smokeless tobacco: Never Used  . Alcohol use No     Allergies   Patient has no known allergies.   Review of Systems Review of Systems  Constitutional: Negative for chills.  Gastrointestinal: Positive for abdominal pain and diarrhea. Negative for vomiting.  Musculoskeletal: Negative for arthralgias.  All other systems reviewed and are negative.    Physical Exam Updated Vital Signs BP (!) 133/101 (BP Location: Left Arm)   Pulse 98   Temp 97.8 F (36.6 C) (Oral)   Resp 12   Ht 5\' 9"  (1.753 m)   Wt 105 lb (47.6 kg)   LMP 03/10/2016   SpO2 100%   BMI 15.51 kg/m   Physical Exam  Constitutional: She appears well-developed and well-nourished. She  appears cachectic. She has a sickly appearance.  HENT:  Head: Normocephalic and atraumatic.  Eyes: Conjunctivae and EOM are normal.  Neck: Normal range of motion.  Cardiovascular: Normal rate and regular rhythm.   Pulmonary/Chest: Effort normal. No stridor. No respiratory distress. She has no wheezes.  Abdominal: Soft. She exhibits no distension. There is no tenderness.  Musculoskeletal: Normal range of motion. She exhibits no edema or deformity.  Neurological: She is alert.  Skin: Skin is warm and dry.  Nursing note and vitals reviewed.    ED Treatments / Results  Labs (all labs ordered are listed, but only abnormal results are displayed) Labs Reviewed  COMPREHENSIVE METABOLIC PANEL - Abnormal; Notable for the following:       Result Value   Sodium 134 (*)    Chloride 96 (*)    Glucose, Bld 450 (*)    Albumin 3.4 (*)    AST 65 (*)    Alkaline Phosphatase 141 (*)    All other components within normal limits  CBC - Abnormal; Notable for the following:    HCT 34.8 (*)    All other components within normal limits  URINALYSIS, ROUTINE W REFLEX MICROSCOPIC (NOT AT [facility]) - Abnormal; Notable for the following:    Specific Gravity, Urine 1.041 (*)    Glucose, UA >1000 (*)    All other components within normal limits  URINE MICROSCOPIC-ADD ON - Abnormal; Notable for the following:    Squamous Epithelial / LPF 0-5 (*)    Bacteria, UA FEW (*)    All other components within normal limits  LIPASE, BLOOD  POC URINE PREG, ED    EKG  EKG Interpretation None       Radiology No results found.  Procedures Procedures (including critical care time)  Medications Ordered in ED Medications  sodium chloride 0.9 % bolus 1,000 mL (0 mLs Intravenous Stopped 03/26/16 1420)  sodium chloride 0.9 % bolus 1,000 mL (0 mLs Intravenous Stopped 03/26/16 1536)     Initial Impression / Assessment and Plan / ED Course  I have reviewed the triage vital signs and the nursing notes.  Pertinent  labs & imaging results that were available during my care of the patient were reviewed by me and considered in my medical decision making (see chart for details).  Clinical Course      My initial concern for this patient was food allergy  Versus inflammatory colitis and less likely infectious. howvr after discussing with her gastroentrologist, Dr. Michail Sermon thought that it was more likely related to diabetic diarrhea. He agreed with starting Bentyl but ultimately thought she needed better blood sugar control. So she will follow up with  her endocrinologist and PCp to work on blood sugar control.  Final Clinical Impressions(s) / ED Diagnoses   Final diagnoses:  Abdominal pain, unspecified abdominal location  Diarrhea, unspecified type    New Prescriptions Discharge Medication List as of 03/26/2016  3:31 PM    START taking these medications   Details  dicyclomine (BENTYL) 20 MG tablet Take 1 tablet (20 mg total) by mouth 2 (two) times daily., Starting Fri 03/26/2016, Until Sun 04/25/2016, Print         Merrily Pew, MD 03/27/16 [phone]

## 2016-04-09 ENCOUNTER — Ambulatory Visit (INDEPENDENT_AMBULATORY_CARE_PROVIDER_SITE_OTHER): Payer: BLUE CROSS/BLUE SHIELD | Admitting: Nurse Practitioner

## 2016-04-09 ENCOUNTER — Encounter: Payer: Self-pay | Admitting: Nurse Practitioner

## 2016-04-09 VITALS — Temp 98.2°F | Ht 67.0 in | Wt 100.8 lb

## 2016-04-09 DIAGNOSIS — K529 Noninfective gastroenteritis and colitis, unspecified: Secondary | ICD-10-CM | POA: Diagnosis not present

## 2016-04-09 DIAGNOSIS — Z23 Encounter for immunization: Secondary | ICD-10-CM

## 2016-04-09 NOTE — Progress Notes (Signed)
Subjective:  Presents for recheck following an ED visit on 11/17. ED physician spoke with her GI specialist and was advised is most likely diarrhea related to her diabetes. No fever or blood in her stool. Mild abdominal cramping at times. Patient states she goes through spells of diarrhea. Thinks it may be related to dairy products. Has an appointment with a new endocrinologist in [place] on 12/29. Some relief of the cramping with Bentyl. Has had a workup with gastroenterology including colonoscopy.  Objective:   Temp 98.2 F (36.8 C) (Oral)   Ht 5\' 7"  (1.702 m)   Wt 100 lb 12.8 oz (45.7 kg)   LMP 03/10/2016   BMI 15.79 kg/m  NAD. Alert, oriented. Lungs clear. Heart regular rate rhythm. Abdomen soft nondistended with mild epigastric area tenderness. Bowel sounds normal 4. No rebound or guarding. No obvious masses.  Assessment:  Problem List Items Addressed This Visit      Digestive   Chronic diarrhea - Primary    Other Visit Diagnoses    Need for vaccination       Relevant Orders   Flu Vaccine QUAD 36+ mos IM (Completed)     Plan: Follow-up with new specialist as planned. Need to get her sugars under control to see if this will help her diarrhea. Warning signs reviewed. Patient to call back in the meantime if worse. Also recommend daily probiotic.

## 2016-04-09 NOTE — Patient Instructions (Signed)
Activia yogurt one cup per day if tolerated Align as directed

## 2016-05-28 ENCOUNTER — Telehealth: Payer: Self-pay | Admitting: Family Medicine

## 2016-05-28 NOTE — Telephone Encounter (Signed)
Please advise 

## 2016-05-28 NOTE — Telephone Encounter (Signed)
PET scan is not appropriate for this problem. I recommend she sees her gastroenterologist if she is still having issues.

## 2016-05-28 NOTE — Telephone Encounter (Addendum)
Patient was seen in December by Hoyle Sauer for stomach pains.  Patient is still having issues and would like a PET scan ordered.

## 2016-05-28 NOTE — Telephone Encounter (Signed)
Left message to return call 

## 2016-06-02 ENCOUNTER — Telehealth: Payer: Self-pay | Admitting: Family Medicine

## 2016-06-02 NOTE — Telephone Encounter (Signed)
Patient wanting to see if doctor will order PET scan.

## 2016-06-02 NOTE — Telephone Encounter (Signed)
See message from 05/28/16

## 2016-06-02 NOTE — Telephone Encounter (Signed)
Since she is already under GI care, I recommend we continue. However, has she seen a GI provider at [facility], etc. If not, that is what I recommend.

## 2016-06-02 NOTE — Telephone Encounter (Signed)
Discussed with pt. Pt states she has been seeing GI dr Soil scientist in [place] and he has not found anything. Told her to follow up with him. She states she has and he does not recommend anything else for her. She is taking culturell. Has diarrhea every 2 -3 hours. No blood in stool, no fever. What is next step.

## 2016-06-03 NOTE — Telephone Encounter (Signed)
Left message to return call 

## 2016-06-21 NOTE — Telephone Encounter (Signed)
Left message to return call 

## 2016-07-15 ENCOUNTER — Encounter: Payer: Self-pay | Admitting: Family Medicine

## 2016-07-15 ENCOUNTER — Ambulatory Visit (INDEPENDENT_AMBULATORY_CARE_PROVIDER_SITE_OTHER): Payer: BLUE CROSS/BLUE SHIELD | Admitting: Family Medicine

## 2016-07-15 VITALS — BP 100/70 | Temp 98.3°F | Ht 67.0 in | Wt 104.0 lb

## 2016-07-15 DIAGNOSIS — K529 Noninfective gastroenteritis and colitis, unspecified: Secondary | ICD-10-CM | POA: Diagnosis not present

## 2016-07-15 DIAGNOSIS — R109 Unspecified abdominal pain: Secondary | ICD-10-CM | POA: Diagnosis not present

## 2016-07-15 DIAGNOSIS — G8929 Other chronic pain: Secondary | ICD-10-CM

## 2016-07-15 DIAGNOSIS — M542 Cervicalgia: Secondary | ICD-10-CM | POA: Diagnosis not present

## 2016-07-15 MED ORDER — CHLORZOXAZONE 500 MG PO TABS: 500.0000 mg | ORAL_TABLET | Freq: Three times a day (TID) | ORAL | 0 refills | Status: DC | PRN

## 2016-07-15 MED ORDER — DICLOFENAC SODIUM 75 MG PO TBEC: 75.0000 mg | DELAYED_RELEASE_TABLET | Freq: Two times a day (BID) | ORAL | 0 refills | Status: DC

## 2016-07-15 NOTE — Progress Notes (Signed)
   Subjective:    Patient ID: Lindsey Murillo, female    DOB: 01/18/1996, 21 y.o.   MRN: [id number]  Neck Pain   This is a new problem. The current episode started in the past 7 days. The problem occurs intermittently. The problem has been unchanged. The pain is associated with nothing. The pain is present in the left side. The quality of the pain is described as aching. The pain is moderate. Nothing aggravates the symptoms. The pain is same all the time. Stiffness is present all day. She has tried nothing for the symptoms. The treatment provided no relief.  Patient states she was told her thyroid was slightly enlarged by endocrinology at [facility] Patient needs a referral to Dr. Oneida Alar.    Review of Systems  Musculoskeletal: Positive for neck pain.  Denies chest tightness pressure pain shortness breath nausea vomiting diarrhea     Objective:   Physical Exam Lungs clear heart regular subjected discomfort on the left side neck no enlargement of the thyroid noted recent lab work for the thyroid done at [facility] Murillo normal       Assessment & Plan:  Left neck pain I believe this is musculoskeletal stretching massage warm compresses anti-inflammatories next few days muscle relaxers when necessary patient is not pregnant  She has ongoing trouble with neck pain consider ultrasound to look at thyroid  Follow-up with [facility] endocrinology for the diabetes  Intermittent abdominal pains and diarrhea she requests referral to Dr. Oneida Alar this was granted

## 2016-07-19 ENCOUNTER — Encounter: Payer: Self-pay | Admitting: Family Medicine

## 2016-07-19 LAB — ANA: Anti Nuclear Antibody(ANA): NEGATIVE

## 2016-07-19 LAB — SEDIMENTATION RATE: Sed Rate: 13 mm/hr (ref 0–32)

## 2016-07-19 LAB — C-REACTIVE PROTEIN: CRP: 18.4 mg/L — AB (ref 0.0–4.9)

## 2016-07-19 LAB — TISSUE TRANSGLUTAMINASE, IGA: Transglutaminase IgA: <2 U/mL (ref 0–3)

## 2016-07-20 ENCOUNTER — Encounter: Payer: Self-pay | Admitting: Gastroenterology

## 2016-08-03 ENCOUNTER — Ambulatory Visit (INDEPENDENT_AMBULATORY_CARE_PROVIDER_SITE_OTHER): Payer: BLUE CROSS/BLUE SHIELD | Admitting: Advanced Practice Midwife

## 2016-08-03 VITALS — BP 108/70 | HR 72 | Ht 69.0 in | Wt 102.0 lb

## 2016-08-03 DIAGNOSIS — R1033 Periumbilical pain: Secondary | ICD-10-CM | POA: Diagnosis not present

## 2016-08-03 NOTE — Progress Notes (Signed)
[facility] Visit  Patient name: Lindsey Murillo MRN [id number]  Date of birth: 1995/11/09  CC & HPI:  Lindsey Murillo is a 21 y.o. African American female presenting today for eval of abdominal pain. She has had it intermittently for 7 months.  It is primarily mid to upper abdomen, used to be "all the time" now is intermittent, although at least once a day.  Often asso w/diarrhea, sometimes incontinent. Wears pads and several pairs of underwear because of this.  Has an appt w/Dr. Oneida Alar next month. Has had a colonoscopy, but "they iddn't see anything so they didn't do anything." Not sexually active.   Pertinent History Reviewed:  Medical & Surgical Hx:   Past Medical History:  Diagnosis Date  . Acanthosis nigricans, acquired   . Asthma   . Diabetic autonomic neuropathy ([place])   . Diabetic peripheral neuropathy ([place])   . Environmental allergies   . Goiter   . Hypoglycemia associated with diabetes ([place])   . Overweight for pediatric patient   . Tachycardia   . Thyroiditis, autoimmune   . Type 1 diabetes mellitus in patient age 31-19 years with HbA1C goal below 7.5    No past surgical history on file. Family History  Problem Relation Age of Onset  . Diabetes Mother     Type II DM  . Thyroid disease Mother   . Diabetes Maternal Grandmother     Type II DM  . Diabetes Cousin     Type II DM    Current Outpatient Prescriptions:  .  albuterol (PROVENTIL HFA;VENTOLIN HFA) 108 (90 Base) MCG/ACT inhaler, Inhale 1 puff into the lungs every 6 (six) hours as needed for wheezing or shortness of breath., Disp: , Rfl:  .  Blood Glucose Monitoring Suppl (ACCU-CHEK AVIVA) device, Use as instructed, Disp: 1 each, Rfl: 0 .  chlorzoxazone (PARAFON FORTE DSC) 500 MG tablet, Take 1 tablet (500 mg total) by mouth 3 (three) times daily as needed for muscle spasms., Disp: 21 tablet, Rfl: 0 .  diclofenac (VOLTAREN) 75 MG EC tablet, Take 1 tablet (75 mg total) by mouth 2 (two) times  daily., Disp: 28 tablet, Rfl: 0 .  glucagon 1 MG injection, Follow package directions for low blood sugar., Disp: 2 each, Rfl: 3 .  ibuprofen (ADVIL,MOTRIN) 200 MG tablet, Take 400 mg by mouth every 6 (six) hours as needed for moderate pain., Disp: , Rfl:  .  insulin aspart (NOVOLOG FLEXPEN) 100 UNIT/ML FlexPen, Inject 8-14 Units into the skin 3 (three) times daily with meals., Disp: 15 mL, Rfl: 0 .  insulin glargine (LANTUS) 100 UNIT/ML injection, Inject 0.16 mLs (16 Units total) into the skin at bedtime., Disp: 10 mL, Rfl: 0 .  dicyclomine (BENTYL) 20 MG tablet, Take 1 tablet (20 mg total) by mouth 2 (two) times daily., Disp: 60 tablet, Rfl: 1 Social History: Reviewed -  reports that she has never smoked. She has never used smokeless tobacco.  Review of Systems:   Constitutional: Negative for fever and chills Eyes: Negative for visual disturbances Respiratory: Negative for shortness of breath, dyspnea Cardiovascular: Negative for chest pain or palpitations  Gastrointestinal: Negative for vomiting, constipation; Genitourinary: Negative for dysuria and urgency, vaginal irritation or itching Musculoskeletal: Negative for back pain, joint pain, myalgias  Neurological: Negative for dizziness and headaches    Objective Findings:    Physical Examination: General appearance - well appearing, and in no distress Mental status - alert, oriented to person, place, and time Chest:  Normal respiratory effort Heart - normal rate and regular rhythm Abdomen:  Soft, nontender Pelvic: did not tolerate even a single digit internal exam.  However, uterus/ovaries non tender to deep external palpation.  Musculoskeletal:  Normal range of motion without pain Extremities:  No edema    No results found for this or any previous visit (from the past 24 hour(s)).    Assessment & Plan:  A:   Most likely GI source of pain P:  F/U w/ GI as schedule.    Return for If you have any  problems.  CRESENZO-DISHMAN,Lindsey Murillo CNM 08/03/2016 12:35 PM

## 2016-08-26 ENCOUNTER — Ambulatory Visit (INDEPENDENT_AMBULATORY_CARE_PROVIDER_SITE_OTHER): Payer: BLUE CROSS/BLUE SHIELD | Admitting: Gastroenterology

## 2016-08-26 ENCOUNTER — Telehealth: Payer: Self-pay

## 2016-08-26 ENCOUNTER — Encounter: Payer: Self-pay | Admitting: Gastroenterology

## 2016-08-26 ENCOUNTER — Other Ambulatory Visit: Payer: Self-pay

## 2016-08-26 DIAGNOSIS — R634 Abnormal weight loss: Secondary | ICD-10-CM | POA: Diagnosis not present

## 2016-08-26 DIAGNOSIS — K529 Noninfective gastroenteritis and colitis, unspecified: Secondary | ICD-10-CM

## 2016-08-26 DIAGNOSIS — R1013 Epigastric pain: Secondary | ICD-10-CM

## 2016-08-26 DIAGNOSIS — R197 Diarrhea, unspecified: Secondary | ICD-10-CM

## 2016-08-26 MED ORDER — VITAMIN E 180 MG (400 UNIT) PO CAPS: 400.0000 [IU] | ORAL_CAPSULE | Freq: Every day | ORAL | 11 refills | Status: DC

## 2016-08-26 MED ORDER — VITAMIN A 7500 UNITS PO CAPS: 7500.0000 [IU] | ORAL_CAPSULE | Freq: Every day | ORAL | 11 refills | Status: DC

## 2016-08-26 MED ORDER — PHYTONADIONE 5 MG PO TABS: 5.0000 mg | ORAL_TABLET | Freq: Once | ORAL | Status: DC

## 2016-08-26 NOTE — Telephone Encounter (Signed)
Called pt to change time for EGD that is scheduled for tomorrow. EGD with [facility] will be done 08/27/16 at 9:30am, pt to arrive at 8:30am. Advised to be NPO after midnight.

## 2016-08-26 NOTE — Patient Instructions (Addendum)
TAKE VITAMINS A, K, AND E DAILY.  ADD CALCIUM WITH VITAMIN D THREE TIMES A DAY AFTER MEALS.  Add CREON 2 with meals and one with snacks TO PREVENT DIARRHEA.  AVOID DAIRY. ADD LACTASE 3 PILLS WITH MEALS THREE TIMES A DAY. SEE INFO BELOW.  ADD GLUCERNA THREE CANS A DAY.  SEE DR. NIDA TO HELP CONTROL YOUR DIABETES. ELEVATED BLOOD GLUCOSE WILL CAUSE YOUR BOWELS TO BE IRREGULAR.  UPPER ENDOSCOPY WITHIN THE NEXT 1-3 WEEKS.  FOLLOW UP IN 2 MOS.

## 2016-08-26 NOTE — Progress Notes (Signed)
ON RECALL  °

## 2016-08-26 NOTE — Assessment & Plan Note (Addendum)
SYMPTOMS NOT IDEALLY CONTROLLED. 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 TRIED UNKNOWN DOSE ZENPEP. DIFFERENTIAL DIAGNOSIS INCLUDES: SMA SYNDROME, LACTOSE INTOERANCE, CELIAC SPRUE, OR PANCREATIC INSUFFICIENCY.  ADD DEAK DAILY. ADD CALCIUM/VIT D THREE TIMES A DAY. Add CREON 2 with meals and one with snacks TO PREVENT DIARRHEA.  AVOID DAIRY. ADD LACTASE 3 PILLS WITH MEALS THREE TIMES A DAY. SEE INFO BELOW. ADD GLUCERNA THREE CANS A DAY. SEE DR. NIDA TO HELP CONTROL YOUR DIABETES. ELEVATED BLOOD GLUCOSE WILL CAUSE YOUR BOWELS TO BE IRREGULAR. UPPER ENDOSCOPY WITHIN THE NEXT 1-3 WEEKS. DISCUSSED PROCEDURE, BENEFITS, & RISKS: < 1% chance of medication reaction, bleeding, OR perforation. OBTAIN RECORDS FROM DR. [facility]  FOLLOW UP IN 2 MOS.

## 2016-08-26 NOTE — Assessment & Plan Note (Signed)
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 TRIED UNKNOWN DOSE ZENPEP. DIFFERENTIAL DIAGNOSIS INCLUDES: SMA SYNDROME, CELIAC SPRUE, OR PANCREATIC INSUFFICIENCY.  ADD DEAK DAILY. ADD CALCIUM/VIT D THREE TIMES A DAY. Add CREON 2 with meals and one with snacks TO PREVENT DIARRHEA.  AVOID DAIRY. ADD LACTASE 3 PILLS WITH MEALS THREE TIMES A DAY. SEE INFO BELOW. ADD GLUCERNA THREE CANS A DAY. SEE DR. NIDA TO HELP CONTROL YOUR DIABETES. ELEVATED BLOOD GLUCOSE WILL CAUSE YOUR BOWELS TO BE IRREGULAR. UPPER ENDOSCOPY WITHIN THE NEXT 1-3 WEEKS. DISCUSSED PROCEDURE, BENEFITS, & RISKS: < 1% chance of medication reaction, bleeding, OR perforation.  FOLLOW UP IN 2 MOS.

## 2016-08-26 NOTE — Progress Notes (Signed)
Subjective:    Patient ID: Lindsey Murillo, female    DOB: 07-26-1995, 21 y.o.   MRN: [id number]  Mickie Hillier, MD  HPI May have 10 bmS a day. MAY HAVE NORMAL STOOL: 1-2X/WEEK HAVING PAIN BELOW NAVEL. MAY HAPPEN AFTER EAT BUT NOT ALL THE TIME. SHARP. ALWAYS IN SAME SPOT. MAY LAST MIS TO DAYS. TRIGGERS: SOMETIMES FOOD. BETTER: TIME. GOT A MEDICINE FROM DR. SCHOOLER AND WASN'T HELPING AND SHE STOPPED IT(ZENPEP). TRIED IT FOR 2 WEEKS. DIABETIC FOR 11-12 YEARS. IN BETWEEN CONTROL SEES ENDOCRINOLOGY AT [facility]. SEEN NIDA IN THE PAST. MILK: NONE. CHEESE: NOT REALLY ICE CREAM: NOR REALLY. BEEN TRYING TO AVOID LACTOSE. DRINKS ALMOND MILK. SORT OF BETTER. WEIGHT LOSS: 138 LBS --> OVER PAST YEAR. HAD TCS BUT NO EGD.   PT DENIES FEVER, CHILLS, HEMATOCHEZIA, HEMATEMESIS, nausea, vomiting, melena, diarrhea, CHEST PAIN, SHORTNESS OF BREATH,  CHANGE IN BOWEL IN HABITS, constipation, problems swallowing, problems with sedation, OR heartburn or indigestion.   Past Medical History:  Diagnosis Date  . Acanthosis nigricans, acquired   . Asthma   . Diabetic autonomic neuropathy ([place])   . Diabetic peripheral neuropathy ([place])   . Environmental allergies   . Goiter   . Hypoglycemia associated with diabetes ([place])   . Overweight for pediatric patient   . Tachycardia   . Thyroiditis, autoimmune   . Type 1 diabetes mellitus in patient age 6-19 years with HbA1C goal below 7.5    Past Surgical History:  Procedure Laterality Date  . COLONOSCOPY      Family History  Problem Relation Age of Onset  . Diabetes Mother     Type II DM  . Thyroid disease Mother   . Diabetes Maternal Grandmother     Type II DM  . Diabetes Cousin     Type II DM  . Colon cancer Neg Hx   . Colon polyps Neg Hx    No Known Allergies  Current Outpatient Prescriptions  Medication Sig Dispense Refill  . albuterol (PROVENTIL HFA;VENTOLIN HFA) 108 (90 Base) MCG/ACT inhaler Inhale 1 puff into the lungs every 6 (six) hours as  needed for wheezing or shortness of breath.    . Blood Glucose Monitoring Suppl (ACCU-CHEK AVIVA) device Use as instructed 1 each 0  . chlorzoxazone (PARAFON FORTE DSC) 500 MG tablet Take 1 tablet (500 mg total) by mouth 3 (three) times daily as needed for muscle spasms. 21 tablet 0  . diclofenac (VOLTAREN) 75 MG EC tablet Take 1 tablet (75 mg total) by mouth 2 (two) times daily.    Marland Kitchen glucagon 1 MG injection Follow package directions for low blood sugar.    . ibuprofen (ADVIL,MOTRIN) 200 MG tablet Take 400 mg by mouth every 6 (six) hours as needed for moderate pain.    Marland Kitchen insulin aspart (NOVOLOG FLEXPEN) 100 UNIT/ML FlexPen Inject 8-14 Units into the skin 3 (three) times daily with meals.    . insulin glargine (LANTUS) 100 UNIT/ML injection Inject 0.16 mLs (16 Units total) into the skin at bedtime.    .       Family History  Problem Relation Age of Onset  . Diabetes Mother     Type II DM  . Thyroid disease Mother   . Diabetes Maternal Grandmother     Type II DM  . Diabetes Cousin     Type II DM   Social History   Social History  . Marital status: Single    Spouse name: N/A  . Number  of children: N/A  . Years of education: N/A   Social History Main Topics  . Smoking status: Never Smoker  . Smokeless tobacco: Never Used  . Alcohol use No  . Drug use: No  . Sexual activity: No   Review of Systems PER HPI OTHERWISE ALL SYSTEMS ARE NEGATIVE.    Objective:   Physical Exam  Constitutional: She is oriented to person, place, and time. She appears well-developed and well-nourished. No distress.  HENT:  Head: Normocephalic and atraumatic.  Mouth/Throat: Oropharynx is clear and moist. No oropharyngeal exudate.  Eyes: Pupils are equal, round, and reactive to light. No scleral icterus.  Neck: Normal range of motion. Neck supple.  Cardiovascular: Normal rate, regular rhythm and normal heart sounds.   Pulmonary/Chest: Effort normal and breath sounds normal. No respiratory distress.    Abdominal: Soft. Bowel sounds are normal. She exhibits no distension. There is tenderness. There is no rebound and no guarding.  MILD TTP IN THE EPIGASTRIUM & BUQS.   Musculoskeletal: She exhibits no edema.  Lymphadenopathy:    She has no cervical adenopathy.  Neurological: She is alert and oriented to person, place, and time.  NO  NEW FOCAL DEFICITS  Psychiatric: She has a normal mood and affect.  Vitals reviewed.     Assessment & Plan:

## 2016-08-26 NOTE — Progress Notes (Signed)
cc'ed to pcp °

## 2016-08-27 ENCOUNTER — Encounter (HOSPITAL_COMMUNITY): Payer: Self-pay | Admitting: *Deleted

## 2016-08-27 ENCOUNTER — Encounter (HOSPITAL_COMMUNITY)
Admission: RE | Disposition: A | Discharge status: Home / Self Care | Payer: Self-pay | Source: Ambulatory Visit | Attending: Gastroenterology

## 2016-08-27 ENCOUNTER — Ambulatory Visit (HOSPITAL_COMMUNITY)
Admission: RE | Admit: 2016-08-27 | Discharge: 2016-08-27 | Disposition: A | Discharge status: Home / Self Care | Payer: BLUE CROSS/BLUE SHIELD | Source: Ambulatory Visit | Attending: Gastroenterology | Admitting: Gastroenterology

## 2016-08-27 DIAGNOSIS — R1013 Epigastric pain: Secondary | ICD-10-CM

## 2016-08-27 DIAGNOSIS — J45909 Unspecified asthma, uncomplicated: Secondary | ICD-10-CM | POA: Diagnosis not present

## 2016-08-27 DIAGNOSIS — E1165 Type 2 diabetes mellitus with hyperglycemia: Secondary | ICD-10-CM | POA: Diagnosis not present

## 2016-08-27 DIAGNOSIS — Z79899 Other long term (current) drug therapy: Secondary | ICD-10-CM | POA: Diagnosis not present

## 2016-08-27 DIAGNOSIS — Z794 Long term (current) use of insulin: Secondary | ICD-10-CM | POA: Insufficient documentation

## 2016-08-27 DIAGNOSIS — R197 Diarrhea, unspecified: Secondary | ICD-10-CM | POA: Diagnosis not present

## 2016-08-27 DIAGNOSIS — E1142 Type 2 diabetes mellitus with diabetic polyneuropathy: Secondary | ICD-10-CM | POA: Insufficient documentation

## 2016-08-27 DIAGNOSIS — Z791 Long term (current) use of non-steroidal anti-inflammatories (NSAID): Secondary | ICD-10-CM | POA: Diagnosis not present

## 2016-08-27 DIAGNOSIS — Z833 Family history of diabetes mellitus: Secondary | ICD-10-CM | POA: Diagnosis not present

## 2016-08-27 DIAGNOSIS — Z7951 Long term (current) use of inhaled steroids: Secondary | ICD-10-CM | POA: Insufficient documentation

## 2016-08-27 DIAGNOSIS — K2970 Gastritis, unspecified, without bleeding: Secondary | ICD-10-CM | POA: Insufficient documentation

## 2016-08-27 HISTORY — PX: ESOPHAGOGASTRODUODENOSCOPY: SHX5428

## 2016-08-27 HISTORY — PX: BIOPSY: SHX5522

## 2016-08-27 LAB — GLUCOSE, CAPILLARY
GLUCOSE-CAPILLARY: 306 mg/dL — AB (ref 65–99)
GLUCOSE-CAPILLARY: 184 mg/dL — AB (ref 65–99)
Glucose-Capillary: 264 mg/dL — ABNORMAL HIGH (ref 65–99)

## 2016-08-27 SURGERY — EGD (ESOPHAGOGASTRODUODENOSCOPY)
Anesthesia: Moderate Sedation

## 2016-08-27 MED ORDER — MIDAZOLAM HCL 5 MG/5ML IJ SOLN
INTRAMUSCULAR | Status: DC | PRN
  Administered 2016-08-27 (×2): 2 mg via INTRAVENOUS
  Administered 2016-08-27: 1 mg via INTRAVENOUS

## 2016-08-27 MED ORDER — LIDOCAINE VISCOUS 2 % MT SOLN
OROMUCOSAL | Status: AC
  Filled 2016-08-27: qty 15

## 2016-08-27 MED ORDER — MIDAZOLAM HCL 5 MG/5ML IJ SOLN
INTRAMUSCULAR | Status: AC
  Filled 2016-08-27: qty 10

## 2016-08-27 MED ORDER — ONDANSETRON HCL 4 MG/2ML IJ SOLN
INTRAMUSCULAR | Status: AC
  Filled 2016-08-27: qty 2

## 2016-08-27 MED ORDER — ONDANSETRON HCL 4 MG/2ML IJ SOLN
4.0000 mg | Freq: Once | INTRAMUSCULAR | Status: AC
  Administered 2016-08-27: 4 mg via INTRAVENOUS

## 2016-08-27 MED ORDER — MEPERIDINE HCL 100 MG/ML IJ SOLN
INTRAMUSCULAR | Status: DC | PRN
  Administered 2016-08-27 (×3): 25 mg via INTRAVENOUS

## 2016-08-27 MED ORDER — LIDOCAINE VISCOUS 2 % MT SOLN
OROMUCOSAL | Status: DC | PRN
  Administered 2016-08-27: 1 via OROMUCOSAL

## 2016-08-27 MED ORDER — MEPERIDINE HCL 100 MG/ML IJ SOLN
INTRAMUSCULAR | Status: AC
  Filled 2016-08-27: qty 2

## 2016-08-27 MED ORDER — SODIUM CHLORIDE 0.9 % IV SOLN
INTRAVENOUS | Status: DC
  Administered 2016-08-27: 1000 mL via INTRAVENOUS

## 2016-08-27 MED ORDER — INSULIN ASPART 100 UNIT/ML IV SOLN
4.0000 [IU] | Freq: Once | INTRAVENOUS | Status: AC
  Administered 2016-08-27: 4 [IU] via INTRAVENOUS
  Filled 2016-08-27: qty 0.04

## 2016-08-27 NOTE — Op Note (Signed)
[facility] Patient Name: Lindsey Murillo Procedure Date: 08/27/2016 9:24 AM MRN: [id number] Date of Birth: September 04, 1995 Attending MD: Barney Drain , MD CSN: [id number] Age: 21 Admit Type: Outpatient Procedure:                Upper GI endoscopy WITH COLD FORCEPS BIOPSY Indications:              Dyspepsia, Diarrhea-PMHx: UNCONTROLLED DIABETES Providers:                Barney Drain, MD, Janeece Riggers, RN, Lurline Del, RN Referring MD:             Rosemary Holms, MD Medicines:                Ondansetron 4 mg IV, Meperidine 75 mg IV, Midazolam                            5 mg IV Complications:            No immediate complications. Estimated Blood Loss:     Estimated blood loss was minimal. Procedure:                Pre-Anesthesia Assessment:                           - Prior to the procedure, a History and Physical                            was performed, and patient medications and                            allergies were reviewed. The patient's tolerance of                            previous anesthesia was also reviewed. The risks                            and benefits of the procedure and the sedation                            options and risks were discussed with the patient.                            All questions were answered, and informed consent                            was obtained. Prior Anticoagulants: The patient has                            taken previous NSAID medication. ASA Grade                            Assessment: II - A patient with mild systemic                            disease. After reviewing the risks and benefits,  the patient was deemed in satisfactory condition to                            undergo the procedure. After obtaining informed                            consent, the endoscope was passed under direct                            vision. Throughout the procedure, the patient's                            blood  pressure, pulse, and oxygen saturations were                            monitored continuously. The [id number] ([id number])                            scope was introduced through the mouth, and                            advanced to the second part of duodenum. The upper                            GI endoscopy was accomplished without difficulty.                            The patient tolerated the procedure well. Scope In: 9:52:23 AM Scope Out: 10:00:50 AM Total Procedure Duration: 0 hours 8 minutes 27 seconds  Findings:      The examined esophagus was normal.      Patchy mild inflammation characterized by congestion (edema) and       erythema was found in the gastric antrum. Biopsies were taken with a       cold forceps for Helicobacter pylori testing.      Abnormal mucosa was found in the second portion of the duodenum-mild       scalloping of the folds. Biopsies inthe bulb and 2nd portion of the       duodenum for histology were taken with a cold forceps for evaluation of       celiac disease. Impression:               - MILD Gastritis.                           - NO OBVIOUS SOURCE FOR DYSPEPSIA/DIARRHEA                            IDENTIFIED. Moderate Sedation:      Moderate (conscious) sedation was administered by the endoscopy nurse       and supervised by the endoscopist. The following parameters were       monitored: oxygen saturation, heart rate, blood pressure, and response       to care. Total physician intraservice time was 20 minutes. Recommendation:           - Await pathology results.                           -  Resume previous diet.                           - Continue present medications.                           - Return to my office in 2 months.                           - Patient has a contact number available for                            emergencies. The signs and symptoms of potential                            delayed complications were discussed with the                             patient. Return to normal activities tomorrow.                            Written discharge instructions were provided to the                            patient. Procedure Code(s):        --- Professional ---                           [phone], Esophagogastroduodenoscopy, flexible,                            transoral; with biopsy, single or multiple                           99152, Moderate sedation services provided by the                            same physician or other qualified health care                            professional performing the diagnostic or                            therapeutic service that the sedation supports,                            requiring the presence of an independent trained                            observer to assist in the monitoring of the                            patient's level of consciousness and physiological                            status;  initial 15 minutes of intraservice time,                            patient age 68 years or older Diagnosis Code(s):        --- Professional ---                           K29.70, Gastritis, unspecified, without bleeding                           R10.13, Epigastric pain                           R19.7, Diarrhea, unspecified CPT copyright 2016 American [employer] Association. All rights reserved. The codes documented in this report are preliminary and upon coder review may  be revised to meet current compliance requirements. Barney Drain, MD Barney Drain, MD 08/27/2016 10:16:51 AM This report has been signed electronically. Number of Addenda: 0

## 2016-08-27 NOTE — H&P (View-Only) (Signed)
Subjective:    Patient ID: Lindsey Murillo, female    DOB: Sep 14, 1995, 21 y.o.   MRN: [id number]  Lindsey Hillier, MD  HPI May have 10 bmS a day. MAY HAVE NORMAL STOOL: 1-2X/WEEK HAVING PAIN BELOW NAVEL. MAY HAPPEN AFTER EAT BUT NOT ALL THE TIME. SHARP. ALWAYS IN SAME SPOT. MAY LAST MIS TO DAYS. TRIGGERS: SOMETIMES FOOD. BETTER: TIME. GOT A MEDICINE FROM DR. SCHOOLER AND WASN'T HELPING AND SHE STOPPED IT(ZENPEP). TRIED IT FOR 2 WEEKS. DIABETIC FOR 11-12 YEARS. IN BETWEEN CONTROL SEES ENDOCRINOLOGY AT [facility]. SEEN NIDA IN THE PAST. MILK: NONE. CHEESE: NOT REALLY ICE CREAM: NOR REALLY. BEEN TRYING TO AVOID LACTOSE. DRINKS ALMOND MILK. SORT OF BETTER. WEIGHT LOSS: 138 LBS --> OVER PAST YEAR. HAD TCS BUT NO EGD.   PT DENIES FEVER, CHILLS, HEMATOCHEZIA, HEMATEMESIS, nausea, vomiting, melena, diarrhea, CHEST PAIN, SHORTNESS OF BREATH,  CHANGE IN BOWEL IN HABITS, constipation, problems swallowing, problems with sedation, OR heartburn or indigestion.   Past Medical History:  Diagnosis Date  . Acanthosis nigricans, acquired   . Asthma   . Diabetic autonomic neuropathy ([place])   . Diabetic peripheral neuropathy ([place])   . Environmental allergies   . Goiter   . Hypoglycemia associated with diabetes ([place])   . Overweight for pediatric patient   . Tachycardia   . Thyroiditis, autoimmune   . Type 1 diabetes mellitus in patient age 62-19 years with HbA1C goal below 7.5    Past Surgical History:  Procedure Laterality Date  . COLONOSCOPY      Family History  Problem Relation Age of Onset  . Diabetes Mother     Type II DM  . Thyroid disease Mother   . Diabetes Maternal Grandmother     Type II DM  . Diabetes Cousin     Type II DM  . Colon cancer Neg Hx   . Colon polyps Neg Hx    No Known Allergies  Current Outpatient Prescriptions  Medication Sig Dispense Refill  . albuterol (PROVENTIL HFA;VENTOLIN HFA) 108 (90 Base) MCG/ACT inhaler Inhale 1 puff into the lungs every 6 (six) hours as  needed for wheezing or shortness of breath.    . Blood Glucose Monitoring Suppl (ACCU-CHEK AVIVA) device Use as instructed 1 each 0  . chlorzoxazone (PARAFON FORTE DSC) 500 MG tablet Take 1 tablet (500 mg total) by mouth 3 (three) times daily as needed for muscle spasms. 21 tablet 0  . diclofenac (VOLTAREN) 75 MG EC tablet Take 1 tablet (75 mg total) by mouth 2 (two) times daily.    Marland Kitchen glucagon 1 MG injection Follow package directions for low blood sugar.    . ibuprofen (ADVIL,MOTRIN) 200 MG tablet Take 400 mg by mouth every 6 (six) hours as needed for moderate pain.    Marland Kitchen insulin aspart (NOVOLOG FLEXPEN) 100 UNIT/ML FlexPen Inject 8-14 Units into the skin 3 (three) times daily with meals.    . insulin glargine (LANTUS) 100 UNIT/ML injection Inject 0.16 mLs (16 Units total) into the skin at bedtime.    .       Family History  Problem Relation Age of Onset  . Diabetes Mother     Type II DM  . Thyroid disease Mother   . Diabetes Maternal Grandmother     Type II DM  . Diabetes Cousin     Type II DM   Social History   Social History  . Marital status: Single    Spouse name: N/A  . Number  of children: N/A  . Years of education: N/A   Social History Main Topics  . Smoking status: Never Smoker  . Smokeless tobacco: Never Used  . Alcohol use No  . Drug use: No  . Sexual activity: No   Review of Systems PER HPI OTHERWISE ALL SYSTEMS ARE NEGATIVE.    Objective:   Physical Exam  Constitutional: She is oriented to person, place, and time. She appears well-developed and well-nourished. No distress.  HENT:  Head: Normocephalic and atraumatic.  Mouth/Throat: Oropharynx is clear and moist. No oropharyngeal exudate.  Eyes: Pupils are equal, round, and reactive to light. No scleral icterus.  Neck: Normal range of motion. Neck supple.  Cardiovascular: Normal rate, regular rhythm and normal heart sounds.   Pulmonary/Chest: Effort normal and breath sounds normal. No respiratory distress.    Abdominal: Soft. Bowel sounds are normal. She exhibits no distension. There is tenderness. There is no rebound and no guarding.  MILD TTP IN THE EPIGASTRIUM & BUQS.   Musculoskeletal: She exhibits no edema.  Lymphadenopathy:    She has no cervical adenopathy.  Neurological: She is alert and oriented to person, place, and time.  NO  NEW FOCAL DEFICITS  Psychiatric: She has a normal mood and affect.  Vitals reviewed.     Assessment & Plan:

## 2016-08-27 NOTE — Interval H&P Note (Signed)
History and Physical Interval Note:  08/27/2016 9:38 AM  Potter  has presented today for surgery, with the diagnosis of dyspepsia, diarrhea  The various methods of treatment have been discussed with the patient and family. After consideration of risks, benefits and other options for treatment, the patient has consented to  Procedure(s) with comments: ESOPHAGOGASTRODUODENOSCOPY (EGD) (N/A) - 9:30 as a surgical intervention .  The patient's history has been reviewed, patient examined, no change in status, stable for surgery.  I have reviewed the patient's chart and labs.  Questions were answered to the patient's satisfaction.     [employer]

## 2016-08-27 NOTE — Discharge Instructions (Signed)
NO SOURCE FOR YOUR SYMPTOMS WAS IDENTIFIED. You have mild gastritis. I biopsied your stomach and small bowel.   TAKE VITAMINS A, K, AND E DAILY.   ADD CALCIUM WITH VITAMIN D THREE TIMES A DAY AFTER MEALS.   Add CREON 2 with meals and one with snacks TO PREVENT DIARRHEA.   AVOID DAIRY. ADD LACTASE 3 PILLS WITH MEALS THREE TIMES A DAY.   ADD GLUCERNA THREE CANS A DAY.   SEE DR. NIDA TO HELP CONTROL YOUR DIABETES. ELEVATED BLOOD GLUCOSE WILL CAUSE YOUR BOWELS TO BE IRREGULAR.   FOLLOW UP IN 2 MOS.   UPPER ENDOSCOPY AFTER CARE Read the instructions outlined below and refer to this sheet in the next week. These discharge instructions provide you with general information on caring for yourself after you leave the hospital. While your treatment has been planned according to the most current medical practices available, unavoidable complications occasionally occur. If you have any problems or questions after discharge, call DR. Lang Zingg, [phone].  ACTIVITY  You may resume your regular activity, but move at a slower pace for the next 24 hours.   Take frequent rest periods for the next 24 hours.   Walking will help get rid of the air and reduce the bloated feeling in your belly (abdomen).   No driving for 24 hours (because of the medicine (anesthesia) used during the test).   You may shower.   Do not sign any important legal documents or operate any machinery for 24 hours (because of the anesthesia used during the test).    NUTRITION  Drink plenty of fluids.   You may resume your normal diet as instructed by your doctor.   Begin with a light meal and progress to your normal diet. Heavy or fried foods are harder to digest and may make you feel sick to your stomach (nauseated).   Avoid alcoholic beverages for 24 hours or as instructed.    MEDICATIONS  You may resume your normal medications.   WHAT YOU CAN EXPECT TODAY  Some feelings of bloating in the abdomen.    Passage of more gas than usual.    IF YOU HAD A BIOPSY TAKEN DURING THE UPPER ENDOSCOPY:  Eat a soft diet IF YOU HAVE NAUSEA, BLOATING, ABDOMINAL PAIN, OR VOMITING.    FINDING OUT THE RESULTS OF YOUR TEST Not all test results are available during your visit. DR. Oneida Alar WILL CALL YOU WITHIN 7 DAYS OF YOUR PROCEDUE WITH YOUR RESULTS. Do not assume everything is normal if you have not heard from DR. Jewell Haught IN ONE WEEK, CALL HER OFFICE AT [phone].  SEEK IMMEDIATE MEDICAL ATTENTION AND CALL THE OFFICE: [phone] IF:  You have more than a spotting of blood in your stool.   Your belly is swollen (abdominal distention).   You are nauseated or vomiting.   You have a temperature over 101F.   You have abdominal pain or discomfort that is severe or gets worse throughout the day.   Gastritis  Gastritis is an inflammation (the body's way of reacting to injury and/or infection) of the stomach. It is often caused by viral or bacterial (germ) infections. It can also be caused BY ASPIRIN, BC/GOODY POWDER'S, (IBUPROFEN) MOTRIN, OR ALEVE (NAPROXEN), chemicals (including alcohol), SPICY FOODS, and medications. This illness may be associated with generalized malaise (feeling tired, not well), UPPER ABDOMINAL STOMACH cramps, and fever. One common bacterial cause of gastritis is an organism known as H. Pylori. This can be treated with antibiotics.

## 2016-09-02 ENCOUNTER — Encounter (HOSPITAL_COMMUNITY): Payer: Self-pay | Admitting: Gastroenterology

## 2016-09-09 ENCOUNTER — Encounter: Payer: Self-pay | Admitting: Gastroenterology

## 2016-09-15 ENCOUNTER — Telehealth: Payer: Self-pay | Admitting: Gastroenterology

## 2016-09-15 NOTE — Telephone Encounter (Signed)
Pt was calling for results and wanted to speak with SF or nurse. I transferred call to DS VM

## 2016-09-15 NOTE — Telephone Encounter (Signed)
PT is aware Dr. Oneida Alar has been on vacation and will be reviewing results and then I will call her.

## 2016-09-16 NOTE — Telephone Encounter (Signed)
PT is aware.

## 2016-09-16 NOTE — Telephone Encounter (Addendum)
Please call pt. HER stomach Bx shows mild gastritis.  HER SMALL BOWEL BIOPSIES ARE NORMAL. SHE DOES NOT HAVE CELIAC SPRUE.   AVOID DAIRY. ADD LACTASE 3 PILLS WITH MEALS THREE TIMES A DAY.  ADD CALCIUM WITH VITAMIN D THREE TIMES A DAY AFTER MEALS.  Add CREON 2 with meals and one with snacks TO PREVENT DIARRHEA.  TAKE VITAMINS A, K, AND E DAILY.  ADD GLUCERNA THREE CANS A DAY.   SEE DR. NIDA TO HELP CONTROL YOUR DIABETES. ELEVATED BLOOD GLUCOSE WILL CAUSE YOUR BOWELS TO BE IRREGULAR.   FOLLOW UP IN JUN 2018 E30 LOOSE STOOLS/DIARRHEA.

## 2016-09-16 NOTE — Telephone Encounter (Signed)
PATIENT SCHEDULED FOR FOLLOW UP 

## 2016-10-01 ENCOUNTER — Other Ambulatory Visit: Payer: Self-pay | Admitting: Family Medicine

## 2016-10-05 NOTE — Telephone Encounter (Signed)
Last seen 07/15/16

## 2016-10-13 ENCOUNTER — Encounter: Payer: Self-pay | Admitting: Gastroenterology

## 2016-10-13 ENCOUNTER — Ambulatory Visit (INDEPENDENT_AMBULATORY_CARE_PROVIDER_SITE_OTHER): Payer: BLUE CROSS/BLUE SHIELD | Admitting: Gastroenterology

## 2016-10-13 VITALS — BP 113/76 | HR 108 | Temp 97.8°F | Ht 67.0 in | Wt 105.6 lb

## 2016-10-13 DIAGNOSIS — K529 Noninfective gastroenteritis and colitis, unspecified: Secondary | ICD-10-CM | POA: Diagnosis not present

## 2016-10-13 NOTE — Progress Notes (Signed)
Referring Provider: Mikey Kirschner, MD Primary Care Physician:  Mikey Kirschner, MD  Primary GI: Dr. Oneida Alar    Chief Complaint  Patient presents with  . Abdominal Pain    pp f/, mid abd pain  . Diarrhea    sometimes    HPI:   Lindsey Murillo is a 21 y.o. female presenting today with a history of chronic diarrhea, unintentional weight loss, Type 1 diabetes with imaging in June 2017 noting atrophic pancreas. She has gained 4 lbs since last seen in April. Colonoscopy completed last year. EGD recently completed negative for celiac. Mild gastritis noted. She is following recommendations of lactase pills TID, calcium and Vit D, Creon 36,000 units (2 with melas and 1 with snacks), and taking vitamins DEAK, glucerna TID.   Diarrhea still happens but "not a lot". Improved but still some days that are horrible. Trying to stay away from cheese and milk. Sometimes has postprandial stool. Overall feels better.   Past Medical History:  Diagnosis Date  . Acanthosis nigricans, acquired   . Asthma   . Diabetic autonomic neuropathy ([place])   . Diabetic peripheral neuropathy ([place])   . Environmental allergies   . Goiter   . Hypoglycemia associated with diabetes ([place])   . Overweight for pediatric patient   . Tachycardia   . Thyroiditis, autoimmune   . Type 1 diabetes mellitus in patient age 56-19 years with HbA1C goal below 7.5     Past Surgical History:  Procedure Laterality Date  . BIOPSY  08/27/2016   Procedure: BIOPSY;  Surgeon: Danie Binder, MD;  Location: [facility];  Service: Endoscopy;;  duodenum; gastric  . COLONOSCOPY    . ESOPHAGOGASTRODUODENOSCOPY N/A 08/27/2016   Dr. Oneida Alar: mild gastritis. Negative celiac. No obvious source for dyspepsia/diarrhea    Current Outpatient Prescriptions  Medication Sig Dispense Refill  . albuterol (PROVENTIL HFA;VENTOLIN HFA) 108 (90 Base) MCG/ACT inhaler Inhale 1 puff into the lungs every 6 (six) hours as needed for wheezing or  shortness of breath.    . Blood Glucose Monitoring Suppl (ACCU-CHEK AVIVA) device Use as instructed 1 each 0  . chlorzoxazone (PARAFON) 500 MG tablet TAKE 1 TABLET BY MOUTH THREE TIMES DAILY AS NEEDED FOR MUSCLE SPASM 21 tablet 0  . dicyclomine (BENTYL) 20 MG tablet Take 1 tablet (20 mg total) by mouth 2 (two) times daily. 60 tablet 1  . glucagon 1 MG injection Follow package directions for low blood sugar. (Patient taking differently: as needed. Follow package directions for low blood sugar.) 2 each 3  . insulin aspart (NOVOLOG FLEXPEN) 100 UNIT/ML FlexPen Inject 8-14 Units into the skin 3 (three) times daily with meals. 15 mL 0  . insulin glargine (LANTUS) 100 UNIT/ML injection Inject 0.16 mLs (16 Units total) into the skin at bedtime. 10 mL 0  . lipase/protease/amylase (CREON) 36000 UNITS CPEP capsule 2 capsules 3 (three) times daily with meals.    . vitamin A (VITAMIN A 7500IU FISH) 7500 UNIT capsule Take 1 capsule (7,500 Units total) by mouth daily. 30 capsule 11  . vitamin E (VITAMIN E) 400 UNIT capsule Take 1 capsule (400 Units total) by mouth daily. 30 capsule 11   Current Facility-Administered Medications  Medication Dose Route Frequency Provider Last Rate Last Dose  . phytonadione (VITAMIN K) tablet 5 mg  5 mg Oral Once Danie Binder, MD        Allergies as of 10/13/2016  . (No Known Allergies)  Family History  Problem Relation Age of Onset  . Diabetes Mother        Type II DM  . Thyroid disease Mother   . Diabetes Maternal Grandmother        Type II DM  . Diabetes Cousin        Type II DM  . Colon cancer Neg Hx   . Colon polyps Neg Hx     Social History   Social History  . Marital status: Single    Spouse name: N/A  . Number of children: N/A  . Years of education: N/A   Social History Main Topics  . Smoking status: Never Smoker  . Smokeless tobacco: Never Used  . Alcohol use No  . Drug use: No  . Sexual activity: No   Other Topics Concern  . None    Social History Narrative  . None    Review of Systems: As mentioned in HPI   Physical Exam: BP 113/76   Pulse (!) 108   Temp 97.8 F (36.6 C) (Oral)   Ht 5\' 7"  (1.702 m)   Wt 105 lb 9.6 oz (47.9 kg)   LMP 09/13/2016 (Approximate)   BMI 16.54 kg/m  General:   Alert and oriented. No distress noted. Thin. Very pleasant.  Head:  Normocephalic and atraumatic. Eyes:  Conjuctiva clear without scleral icterus. Abdomen:  +BS, soft, mild epigastric TTP and non-distended. No rebound or guarding. No HSM or masses noted. Msk:  Symmetrical without gross deformities. Normal posture. Extremities:  Without edema. Neurologic:  Alert and  oriented x4;  grossly normal neurologically. Psych:  Alert and cooperative. Normal mood and affect.

## 2016-10-13 NOTE — Assessment & Plan Note (Signed)
EGD without celiac disease. Likely pancreatic insufficiency as culprit. Improving with Creon, addition of Glucerna, and avoidance of dairy products. Will obtain records from colonoscopy by Dr. Michail Sermon with [facility], as this was done fairly recently (2017). Weight improved by 4 lbs since April. Return in 3 months for close follow-up.

## 2016-10-13 NOTE — Progress Notes (Signed)
cc'ed to pcp °

## 2016-10-13 NOTE — Patient Instructions (Signed)
Continue the Creon 36,000 units taking 2 with meals three times a day and 1 with snacks.   Continue the lactase pills and vitamins as prescribed. Continue the glucerna three times a day.  We will see you in 3 months.

## 2016-12-20 ENCOUNTER — Encounter: Payer: Self-pay | Admitting: Gastroenterology

## 2016-12-24 NOTE — Progress Notes (Signed)
REVIEWED-NO ADDITIONAL RECOMMENDATIONS. 

## 2017-02-09 ENCOUNTER — Ambulatory Visit (INDEPENDENT_AMBULATORY_CARE_PROVIDER_SITE_OTHER): Payer: BLUE CROSS/BLUE SHIELD | Admitting: Gastroenterology

## 2017-02-09 ENCOUNTER — Encounter: Payer: Self-pay | Admitting: Gastroenterology

## 2017-02-09 DIAGNOSIS — R634 Abnormal weight loss: Secondary | ICD-10-CM | POA: Diagnosis not present

## 2017-02-09 DIAGNOSIS — K529 Noninfective gastroenteritis and colitis, unspecified: Secondary | ICD-10-CM | POA: Diagnosis not present

## 2017-02-09 NOTE — Progress Notes (Signed)
Subjective:    Patient ID: Lindsey Murillo, female    DOB: Oct 26, 1995, 21 y.o.   MRN: [id number]  Mikey Kirschner, MD   HPI NOW DIARRHEA ONLY BAD AT NIGHT. NOT EATING AS MUCH DAIRY. BEEN TRYING TO STOP. TAKING CREON 2 WITH MEALS AND ONE WITH SNACKS, BUT DOESN'T REALLY SNACK. THROUGHOUT THE DAY: # 3 AND THEN AT NIGHTTIME IT'S A #6 OR #7. HAS NAUSEA WHEN SHE GETS HOT. MAY HAVE ABDOMINAL PAIN WORSE AT NIGHTTIME. APPETITE: GREAT. GAINED 6 LBS SI NCE LAST VISIT. STILL NUTRITIONAL DRINK: BID(GLUCERNA)  PT DENIES FEVER, CHILLS, HEMATOCHEZIA, vomiting, melena, CHEST PAIN, SHORTNESS OF BREATH, CHANGE IN BOWEL IN HABITS, constipation, abdominal pain, problems swallowing, problems with sedation, OR heartburn or indigestion.  Past Medical History:  Diagnosis Date  . Acanthosis nigricans, acquired   . Asthma   . Diabetic autonomic neuropathy ([place])   . Diabetic peripheral neuropathy ([place])   . Environmental allergies   . Goiter   . Hypoglycemia associated with diabetes ([place])   . Tachycardia   . Thyroiditis, autoimmune   . Type 1 diabetes mellitus in patient age 47-19 years with HbA1C goal below 7.5    Past Surgical History:  Procedure Laterality Date  . BIOPSY  08/27/2016   Procedure: BIOPSY;  Surgeon: Danie Binder, MD;  Location: [facility];  Service: Endoscopy;;  duodenum; gastric  . COLONOSCOPY    . ESOPHAGOGASTRODUODENOSCOPY N/A 08/27/2016   Dr. Oneida Alar: mild gastritis. Negative celiac. No obvious source for dyspepsia/diarrhea    No Known Allergies  Current Outpatient Prescriptions  Medication Sig Dispense Refill  . albuterol (PROVENTIL HFA;VENTOLIN HFA) 108 (90 Base) MCG/ACT inhaler Inhale 1 puff into the lungs every 6 (six) hours as needed for wheezing or shortness of breath.    . Blood Glucose Monitoring Suppl (ACCU-CHEK AVIVA) device Use as instructed    . chlorzoxazone (PARAFON) 500 MG tablet TAKE 1 TABLET BY MOUTH THREE TIMES DAILY AS NEEDED FOR MUSCLE SPASM    .  glucagon 1 MG injection Follow package directions for low blood sugar. (Patient taking differently: as needed. Follow package directions for low blood sugar.)    . insulin aspart (NOVOLOG FLEXPEN) 100 UNIT/ML FlexPen Inject 8-14 Units into the skin 3 (three) times daily with meals. (Patient taking differently: Inject 8-14 Units into the skin 3 (three) times daily with meals. Sliding scale)    . insulin glargine (LANTUS) 100 UNIT/ML injection Inject 0.16 mLs (16 Units total) into the skin at bedtime. (Patient taking differently: Inject 50 Units into the skin at bedtime. )    . lipase/protease/amylase (CREON) 36000 UNITS CPEP capsule 2 capsules 3 (three) times daily with meals.    . vitamin A (VITAMIN A 7500IU FISH) 7500 UNIT capsule Take 1 capsule (7,500 Units total) by mouth daily.    . vitamin E (VITAMIN E) 400 UNIT capsule Take 1 capsule (400 Units total) by mouth daily.    Marland Kitchen dicyclomine (BENTYL) 20 MG tablet Take 1 tablet (20 mg total) by mouth 2 (two) times daily. (Patient taking differently: Take 20 mg by mouth 2 (two) times daily as needed. )     Current Facility-Administered Medications  Medication Dose Route Frequency Provider Last Rate Last Dose  . phytonadione (VITAMIN K) tablet 5 mg  5 mg Oral Once Tahj Njoku, Marga Melnick, MD         Review of Systems PER HPI OTHERWISE ALL SYSTEMS ARE NEGATIVE.    Objective:   Physical Exam  Constitutional: She  is oriented to person, place, and time. She appears well-developed and well-nourished. No distress.  HENT:  Head: Normocephalic and atraumatic.  Mouth/Throat: Oropharynx is clear and moist. No oropharyngeal exudate.  Eyes: Pupils are equal, round, and reactive to light. No scleral icterus.  Neck: Normal range of motion. Neck supple.  Cardiovascular: Normal rate, regular rhythm and normal heart sounds.   Pulmonary/Chest: Effort normal and breath sounds normal. No respiratory distress.  Abdominal: Soft. Bowel sounds are normal. She exhibits no  distension. There is tenderness. There is no rebound and no guarding.  MILD TTP x4  Musculoskeletal: She exhibits no edema.  Lymphadenopathy:    She has no cervical adenopathy.  Neurological: She is alert and oriented to person, place, and time.  NO  NEW FOCAL DEFICITS  Psychiatric: She has a normal mood and affect.  Vitals reviewed.         Assessment & Plan:

## 2017-02-09 NOTE — Assessment & Plan Note (Signed)
MOST LIKELY DUE TO DIABETIC ENTEROPATHY, LACTOSE INTOLERANCE, AND PANCREATIC INSUFFICIENCY.  WEIGHT UP 6 LBS SINCE JUN 2018.  DISCUSSED WITH PT THAT THE DIAGNOSIS OF IRRITABLE BOWEL IS A CLINICAL DIAGNOSIS & THERE IS NO BLOOD TEST THAT IS MORE RELIABLE THAN THE CLINICAL DIAGNOSIS. USE 3 LACTASE PILLS WITH MEALS FOUR TIMES A DAY. CONTINUE GLUCERNA TWICE DAILY. CONTINUE CREON 2 WITH MEAS AND ONE WITH SNACKS. CALL IN ONE MONTH IF SYMPTOMS: DIARRHEA/ABDOMINAL PAIN ARE NOT IMPROVED. WIL CONSIDER ABX FOR SIBO. FOLLOW UP IN 4 MOS.

## 2017-02-09 NOTE — Progress Notes (Signed)
ON RECALL  °

## 2017-02-09 NOTE — Assessment & Plan Note (Signed)
WEIGHT UP 6 LBS SINCE JUN 2018. BMI 16.  CONTINUE TO MONITOR SYMPTOMS. PT TAKING VIT DEAK. FOLLOW UP IN 4 MOS.

## 2017-02-09 NOTE — Patient Instructions (Addendum)
YOUR ABDOMINAL PAIN AND DIARRHEA ARE MOST LIKELY DUE TO DIABETES, LACTOSE INTOLERANCE, AND PANCREATIC INSUFFICIENCY. THE DIAGNOSIS OF IRRITABLE BOWEL IS A CLINICAL DIAGNOSIS THERE IS NO BLOOD TEST THAT IS MORE RELIABLE THAN THE CLINICAL DIAGNOSIS.   USE 3 LACTASE PILLS WITH MEALS FOUR TIMES A DAY.  CONTINUE GLUCERNA TWICE DAILY.  CONTINUE CREON 2 WITH MEAS AND ONE WITH SNACKS.   PLEASE CALL IN ONE MONTH IF SYMPTOMS: DIARRHEA/ABDOMINAL PAIN ARE NOT IMPROVED.    FOLLOW UP IN 4 MOS.

## 2017-02-09 NOTE — Progress Notes (Signed)
cc'ed to pcp °

## 2017-04-10 ENCOUNTER — Emergency Department (HOSPITAL_COMMUNITY): Payer: BLUE CROSS/BLUE SHIELD

## 2017-04-10 ENCOUNTER — Encounter (HOSPITAL_COMMUNITY): Payer: Self-pay | Admitting: Emergency Medicine

## 2017-04-10 ENCOUNTER — Other Ambulatory Visit: Payer: Self-pay

## 2017-04-10 ENCOUNTER — Emergency Department (HOSPITAL_COMMUNITY)
Admission: EM | Admit: 2017-04-10 | Discharge: 2017-04-10 | Disposition: A | Discharge status: Home / Self Care | Payer: BLUE CROSS/BLUE SHIELD | Attending: Emergency Medicine | Admitting: Emergency Medicine

## 2017-04-10 DIAGNOSIS — E1065 Type 1 diabetes mellitus with hyperglycemia: Secondary | ICD-10-CM | POA: Diagnosis not present

## 2017-04-10 DIAGNOSIS — J45909 Unspecified asthma, uncomplicated: Secondary | ICD-10-CM | POA: Diagnosis not present

## 2017-04-10 DIAGNOSIS — R109 Unspecified abdominal pain: Secondary | ICD-10-CM | POA: Diagnosis present

## 2017-04-10 DIAGNOSIS — R000 Tachycardia, unspecified: Secondary | ICD-10-CM | POA: Diagnosis not present

## 2017-04-10 LAB — URINALYSIS, ROUTINE W REFLEX MICROSCOPIC
BILIRUBIN URINE: NEGATIVE
Glucose, UA: 150 mg/dL — AB
Ketones, ur: 5 mg/dL — AB
LEUKOCYTES UA: NEGATIVE
Nitrite: NEGATIVE
Protein, ur: NEGATIVE mg/dL
Specific Gravity, Urine: 1.002 — ABNORMAL LOW (ref 1.005–1.030)
pH: 5 (ref 5.0–8.0)

## 2017-04-10 LAB — BLOOD GAS, VENOUS
Acid-Base Excess: 2.9 mmol/L — ABNORMAL HIGH (ref 0.0–2.0)
BICARBONATE: 26.1 mmol/L (ref 20.0–28.0)
O2 SAT: 78.9 %
PCO2 VEN: 45.4 mmHg (ref 44.0–60.0)
PO2 VEN: 46.3 mmHg — AB (ref 32.0–45.0)
pH, Ven: 7.397 (ref 7.250–7.430)

## 2017-04-10 LAB — COMPREHENSIVE METABOLIC PANEL
ALBUMIN: 3.6 g/dL (ref 3.5–5.0)
ALT: 19 U/L (ref 14–54)
AST: 18 U/L (ref 15–41)
Alkaline Phosphatase: 153 U/L — ABNORMAL HIGH (ref 38–126)
Anion gap: 11 (ref 5–15)
BUN: 11 mg/dL (ref 6–20)
CHLORIDE: 99 mmol/L — AB (ref 101–111)
CO2: 28 mmol/L (ref 22–32)
Calcium: 8.9 mg/dL (ref 8.9–10.3)
Creatinine, Ser: 0.45 mg/dL (ref 0.44–1.00)
GFR calc Af Amer: >60 mL/min (ref >60)
GFR calc non Af Amer: >60 mL/min (ref >60)
GLUCOSE: 276 mg/dL — AB (ref 65–99)
POTASSIUM: 3.3 mmol/L — AB (ref 3.5–5.1)
Sodium: 138 mmol/L (ref 135–145)
Total Bilirubin: 1.1 mg/dL (ref 0.3–1.2)
Total Protein: 7.4 g/dL (ref 6.5–8.1)

## 2017-04-10 LAB — CBG MONITORING, ED
GLUCOSE-CAPILLARY: 316 mg/dL — AB (ref 65–99)
Glucose-Capillary: 241 mg/dL — ABNORMAL HIGH (ref 65–99)

## 2017-04-10 LAB — CBC
HEMATOCRIT: 38.5 % (ref 36.0–46.0)
Hemoglobin: 13.2 g/dL (ref 12.0–15.0)
MCH: 28.2 pg (ref 26.0–34.0)
MCHC: 34.3 g/dL (ref 30.0–36.0)
MCV: 82.3 fL (ref 78.0–100.0)
Platelets: 215 10*3/uL (ref 150–400)
RBC: 4.68 MIL/uL (ref 3.87–5.11)
RDW: 12.1 % (ref 11.5–15.5)
WBC: 9.6 10*3/uL (ref 4.0–10.5)

## 2017-04-10 LAB — I-STAT BETA HCG BLOOD, ED (MC, WL, AP ONLY)

## 2017-04-10 LAB — LIPASE, BLOOD: LIPASE: 37 U/L (ref 11–51)

## 2017-04-10 MED ORDER — HYDROMORPHONE HCL 1 MG/ML IJ SOLN
0.5000 mg | Freq: Once | INTRAMUSCULAR | Status: AC
Start: 2017-04-10 — End: 2017-04-10
  Administered 2017-04-10: 0.5 mg via INTRAVENOUS
  Filled 2017-04-10: qty 1

## 2017-04-10 MED ORDER — SODIUM CHLORIDE 0.9 % IV BOLUS (SEPSIS)
1000.0000 mL | Freq: Once | INTRAVENOUS | Status: AC
  Administered 2017-04-10: 1000 mL via INTRAVENOUS

## 2017-04-10 MED ORDER — SODIUM CHLORIDE 0.9 % IV BOLUS (SEPSIS)
1000.0000 mL | Freq: Once | INTRAVENOUS | Status: AC
Start: 2017-04-10 — End: 2017-04-10
  Administered 2017-04-10: 1000 mL via INTRAVENOUS

## 2017-04-10 MED ORDER — IOPAMIDOL (ISOVUE-300) INJECTION 61%
INTRAVENOUS | Status: AC
  Filled 2017-04-10: qty 30

## 2017-04-10 MED ORDER — INSULIN ASPART 100 UNIT/ML ~~LOC~~ SOLN
5.0000 [IU] | Freq: Once | SUBCUTANEOUS | Status: AC
  Administered 2017-04-10: 5 [IU] via SUBCUTANEOUS
  Filled 2017-04-10: qty 1

## 2017-04-10 MED ORDER — ONDANSETRON HCL 4 MG/2ML IJ SOLN
4.0000 mg | Freq: Once | INTRAMUSCULAR | Status: AC
  Administered 2017-04-10: 4 mg via INTRAVENOUS
  Filled 2017-04-10: qty 2

## 2017-04-10 MED ORDER — IOPAMIDOL (ISOVUE-300) INJECTION 61%
100.0000 mL | Freq: Once | INTRAVENOUS | Status: AC | PRN
  Administered 2017-04-10: 100 mL via INTRAVENOUS

## 2017-04-10 NOTE — ED Notes (Signed)
Patient transported to X-ray 

## 2017-04-10 NOTE — ED Triage Notes (Signed)
Patient c/o upper abd pain with nausea and vomiting that started 2 days ago. Denies any diarrhea, fevers, or urinary symptoms. Per patient "hurts to take a deep breath due to pain." HX of "pancreatic issues" per mother.

## 2017-04-10 NOTE — ED Provider Notes (Signed)
Emergency Department Provider Note   I have reviewed the triage vital signs and the nursing notes.   HISTORY  Chief Complaint Abdominal Pain   HPI Lindsey Murillo is a 21 y.o. female past medical history of diabetes and multiple complications of diabetes a presents to the emergency department today with abdominal pain, vomiting and hyperglycemia.  Patient states her last 2 or 3 days she has had persistent epigastric pain that worse with taking a deep breath but present all the time.  Seems to be moderate in nature but becomes severe with deep breaths.  She has not tried anything to help with the pain.  She has not had it at this before but does states she has "pancreas problems".  She is not sure what does make his problems are beyond that.  She states she has been compliant with her medications but has not had much of an appetite has been eating much recently.  She states she has been drinking a lot of water and urinate normally and had some suprapubic pressure but no other urinary symptoms.  States she feels slightly hot now but no measured temperatures that were high.  Is not claiming back pain, rashes, leg swelling.  She is no history of DVTs.  She does endorse a mild cough.  States the pain does not radiate anywhere.  No other associated or modifying symptoms.    Past Medical History:  Diagnosis Date  . Acanthosis nigricans, acquired   . Asthma   . Diabetic autonomic neuropathy ([place])   . Diabetic peripheral neuropathy ([place])   . Environmental allergies   . Goiter   . Hypoglycemia associated with diabetes ([place])   . Tachycardia   . Thyroiditis, autoimmune   . Type 1 diabetes mellitus in patient age 31-19 years with HbA1C goal below 7.5     Patient Active Problem List   Diagnosis Date Noted  . Dyspepsia   . Weight loss, unintentional 08/26/2016  . Chronic diarrhea 04/09/2016  . Type I diabetes mellitus with complication, uncontrolled ([place]) 02/26/2014  . Noncompliance  02/26/2014  . Essential hypertension, benign 11/30/2012  . Acanthosis nigricans, acquired   . Hypoglycemia associated with diabetes ([place])   . Goiter   . Tachycardia   . Diabetic autonomic neuropathy ([place])   . Diabetic peripheral neuropathy ([place])   . Thyroiditis, autoimmune   . Asthma   . Environmental allergies     Past Surgical History:  Procedure Laterality Date  . BIOPSY  08/27/2016   Procedure: BIOPSY;  Surgeon: Danie Binder, MD;  [facility];  Service: Endoscopy;;  duodenum; gastric  . COLONOSCOPY    . ESOPHAGOGASTRODUODENOSCOPY N/A 08/27/2016   Dr. Oneida Alar: mild gastritis. Negative celiac. No obvious source for dyspepsia/diarrhea    Current Outpatient Rx  . Order #: [id number] Class: Normal  . Order #: [id number] Class: Normal  . Order #: [id number] Class: Historical Med  . Order #: [id number] Class: Normal  . Order #: [id number] Class: Normal  . Order #: [id number] Class: Historical Med  . Order #: [id number] Class: Normal  . Order #: [id number] Class: Normal    Allergies Patient has no known allergies.  Family History  Problem Relation Age of Onset  . Diabetes Mother        Type II DM  . Thyroid disease Mother   . Diabetes Maternal Grandmother        Type II DM  . Diabetes Cousin        Type II DM  . Colon cancer  Neg Hx   . Colon polyps Neg Hx     Social History Social History   Tobacco Use  . Smoking status: Never Smoker  . Smokeless tobacco: Never Used  Substance Use Topics  . Alcohol use: No  . Drug use: No    Review of Systems  All other systems negative except as documented in the HPI. All pertinent positives and negatives as reviewed in the HPI. ____________________________________________   PHYSICAL EXAM:  VITAL SIGNS: ED Triage Vitals  Enc Vitals Group     BP 04/10/17 1525 (!) 140/101     Pulse Rate 04/10/17 1525 (!) 124     Resp 04/10/17 1525 18     Temp 04/10/17 1525 98.4 F (36.9 C)     Temp Source 04/10/17 1525 Oral     SpO2  04/10/17 1525 98 %     Weight 04/10/17 1524 110 lb (49.9 kg)     Height 04/10/17 1524 5\' 7"  (1.702 m)     Head Circumference --      Peak Flow --      Pain Score 04/10/17 1526 9     Pain Loc --      Pain Edu? --      Excl. in [place]? --     Constitutional: Alert and oriented. Well appearing and in no acute distress. Eyes: Conjunctivae are normal. PERRL. EOMI. Head: Atraumatic. Nose: No congestion/rhinnorhea. Mouth/Throat: Mucous membranes are dry.  Oropharynx non-erythematous.  Lips are chapped Neck: No stridor.  No meningeal signs.   Cardiovascular: Tachycardic rate, regular rhythm. Good peripheral circulation. Grossly normal heart sounds given how fast her heart is going.   Respiratory: Tachypnea respiratory effort.  No retractions. Lungs CTAB. Gastrointestinal: Soft and nontender. No distention.  Musculoskeletal: No lower extremity tenderness nor edema. No gross deformities of extremities. Neurologic:  Normal speech and language. No gross focal neurologic deficits are appreciated.  Skin:  Skin is warm, dry and intact. No rash noted.   ____________________________________________   LABS (all labs ordered are listed, but only abnormal results are displayed)  Labs Reviewed  COMPREHENSIVE METABOLIC PANEL - Abnormal; Notable for the following components:      Result Value   Potassium 3.3 (*)    Chloride 99 (*)    Glucose, Bld 276 (*)    Alkaline Phosphatase 153 (*)    All other components within normal limits  URINALYSIS, ROUTINE W REFLEX MICROSCOPIC - Abnormal; Notable for the following components:   Color, Urine COLORLESS (*)    Specific Gravity, Urine 1.002 (*)    Glucose, UA 150 (*)    Hgb urine dipstick LARGE (*)    Ketones, ur 5 (*)    Bacteria, UA RARE (*)    Squamous Epithelial / LPF 0-5 (*)    All other components within normal limits  BLOOD GAS, VENOUS - Abnormal; Notable for the following components:   pO2, Ven 46.3 (*)    Acid-Base Excess 2.9 (*)    All other  components within normal limits  CBG MONITORING, ED - Abnormal; Notable for the following components:   Glucose-Capillary 316 (*)    All other components within normal limits  CBG MONITORING, ED - Abnormal; Notable for the following components:   Glucose-Capillary 241 (*)    All other components within normal limits  LIPASE, BLOOD  CBC  I-STAT BETA HCG BLOOD, ED ([facility], [facility], [facility] ONLY)   ____________________________________________  EKG   EKG Interpretation  Date/Time:  Sunday April 10 2017 16:18:07  EST Ventricular Rate:  115 PR Interval:    QRS Duration: 102 QT Interval:  323 QTC Calculation: 447 R Axis:   52 Text Interpretation:  Sinus tachycardia Probable left atrial enlargement Low voltage, precordial leads RSR' in V1 or V2, right VCD or RVH Borderline T abnormalities, anterior leads No old tracing to compare Confirmed by Merrily Pew [phone]) on 04/10/2017 4:22:53 PM       ____________________________________________  RADIOLOGY  Dg Chest 2 View  Result Date: 04/10/2017 CLINICAL DATA:  Patient c/o upper abd pain with nausea and vomiting that started 2 days ago. Denies any diarrhea, fevers, or urinary symptoms. Per patient "hurts to take a deep breath due to pain." HX of "pancreatic issues" per mottherHISTORY OF HTN, ASTHMA EXAM: CHEST  2 VIEW COMPARISON:  05/03/2005 FINDINGS: The heart size and mediastinal contours are within normal limits. Both lungs are clear. No pleural effusion or pneumothorax. The visualized skeletal structures are unremarkable. IMPRESSION: No active cardiopulmonary disease. Electronically Signed   By: Lajean Manes M.D.   On: 04/10/2017 16:19   Ct Abdomen Pelvis W Contrast  Result Date: 04/10/2017 CLINICAL DATA:  21 year old female with abdominal pain with nausea and vomiting for 2 days. EXAM: CT ABDOMEN AND PELVIS WITH CONTRAST TECHNIQUE: Multidetector CT imaging of the abdomen and pelvis was performed using the standard protocol following bolus  administration of intravenous contrast. CONTRAST:  132mL ISOVUE-300 IOPAMIDOL (ISOVUE-300) INJECTION 61% COMPARISON:  11/03/2015 CT FINDINGS: Lower chest: Unremarkable Hepatobiliary: The liver and gallbladder are unremarkable. No biliary dilatation. Pancreas: A 2.5 x 4.3 cm cyst adjacent to the pancreatic tail is unchanged. A few scattered pancreatic calcifications are noted. The remainder the pancreas is unremarkable. No evidence of adjacent inflammation. Spleen: Unremarkable Adrenals/Urinary Tract: The kidneys, adrenal glands and bladder are unremarkable. Stomach/Bowel: Stomach is within normal limits. Appendix appears normal. No evidence of bowel wall thickening, distention, or inflammatory changes. Vascular/Lymphatic: No significant vascular findings are present. No enlarged abdominal or pelvic lymph nodes. Reproductive: A 3.5 cm right ovarian cyst is present. Septate uterus identified. The left adnexal region is unremarkable. Other: No ascites, pneumoperitoneum or abdominal wall hernia. Musculoskeletal: No acute or significant osseous findings. IMPRESSION: 1. 3.5 cm right ovarian cyst appears benign.  No free fluid. 2. No other acute abnormalities 3. Stable peripancreatic cyst likely representing a pseudocyst. 4. Septate uterus Electronically Signed   By: Margarette Canada M.D.   On: 04/10/2017 20:54    ____________________________________________   PROCEDURES  Procedure(s) performed:   Procedures   ____________________________________________   INITIAL IMPRESSION / ASSESSMENT AND PLAN / ED COURSE  Concern for DKA vs pancreatitis vs pneumonia. Will evaluate appropriately. Fluids/insulin/pain meds at same time.   Workup unremarkable. Still slightly tachycardic but on review of records she is always in 105-115 range so I think this is her normal. Plan for discharge.      Pertinent labs & imaging results that were available during my care of the patient were reviewed by me and considered in my  medical decision making (see chart for details).  ____________________________________________  FINAL CLINICAL IMPRESSION(S) / ED DIAGNOSES  Final diagnoses:  Abdominal pain, unspecified abdominal location  Tachycardia     MEDICATIONS GIVEN DURING THIS VISIT:  Medications  iopamidol (ISOVUE-300) 61 % injection (not administered)  sodium chloride 0.9 % bolus 1,000 mL (0 mLs Intravenous Stopped 04/10/17 1813)  ondansetron (ZOFRAN) injection 4 mg (4 mg Intravenous Given 04/10/17 1558)  HYDROmorphone (DILAUDID) injection 0.5 mg (0.5 mg Intravenous Given 04/10/17 1558)  insulin  aspart (novoLOG) injection 5 Units (5 Units Subcutaneous Given 04/10/17 1558)  sodium chloride 0.9 % bolus 1,000 mL (0 mLs Intravenous Stopped 04/10/17 2123)  iopamidol (ISOVUE-300) 61 % injection 100 mL (100 mLs Intravenous Contrast Given 04/10/17 2006)  sodium chloride 0.9 % bolus 1,000 mL (1,000 mLs Intravenous New Bag/Given 04/10/17 2123)     NEW OUTPATIENT MEDICATIONS STARTED DURING THIS VISIT:  This SmartLink is deprecated. Use AVSMEDLIST instead to display the medication list for a patient.  Note:  This note was prepared with assistance of Dragon voice recognition software. Occasional wrong-word or sound-a-like substitutions may have occurred due to the inherent limitations of voice recognition software.   Merrily Pew, MD 04/10/17 2233

## 2017-04-10 NOTE — ED Notes (Signed)
Patient transported to CT 

## 2017-04-12 ENCOUNTER — Other Ambulatory Visit: Payer: Self-pay

## 2017-04-12 ENCOUNTER — Emergency Department (HOSPITAL_COMMUNITY)
Admission: EM | Admit: 2017-04-12 | Discharge: 2017-04-12 | Disposition: A | Discharge status: Home / Self Care | Payer: BLUE CROSS/BLUE SHIELD | Attending: Emergency Medicine | Admitting: Emergency Medicine

## 2017-04-12 ENCOUNTER — Encounter (HOSPITAL_COMMUNITY): Payer: Self-pay | Admitting: Emergency Medicine

## 2017-04-12 DIAGNOSIS — Z794 Long term (current) use of insulin: Secondary | ICD-10-CM | POA: Insufficient documentation

## 2017-04-12 DIAGNOSIS — J45909 Unspecified asthma, uncomplicated: Secondary | ICD-10-CM | POA: Diagnosis not present

## 2017-04-12 DIAGNOSIS — Z79899 Other long term (current) drug therapy: Secondary | ICD-10-CM | POA: Insufficient documentation

## 2017-04-12 DIAGNOSIS — I10 Essential (primary) hypertension: Secondary | ICD-10-CM | POA: Diagnosis not present

## 2017-04-12 DIAGNOSIS — E109 Type 1 diabetes mellitus without complications: Secondary | ICD-10-CM | POA: Insufficient documentation

## 2017-04-12 DIAGNOSIS — R112 Nausea with vomiting, unspecified: Secondary | ICD-10-CM

## 2017-04-12 LAB — LIPASE, BLOOD: LIPASE: 34 U/L (ref 11–51)

## 2017-04-12 LAB — COMPREHENSIVE METABOLIC PANEL
ALT: 18 U/L (ref 14–54)
AST: 23 U/L (ref 15–41)
Albumin: 3.8 g/dL (ref 3.5–5.0)
Alkaline Phosphatase: 137 U/L — ABNORMAL HIGH (ref 38–126)
Anion gap: 15 (ref 5–15)
BUN: 11 mg/dL (ref 6–20)
CHLORIDE: 92 mmol/L — AB (ref 101–111)
CO2: 28 mmol/L (ref 22–32)
CREATININE: 0.45 mg/dL (ref 0.44–1.00)
Calcium: 9.2 mg/dL (ref 8.9–10.3)
Glucose, Bld: 127 mg/dL — ABNORMAL HIGH (ref 65–99)
POTASSIUM: 3 mmol/L — AB (ref 3.5–5.1)
Sodium: 135 mmol/L (ref 135–145)
TOTAL PROTEIN: 7.8 g/dL (ref 6.5–8.1)
Total Bilirubin: 1.4 mg/dL — ABNORMAL HIGH (ref 0.3–1.2)

## 2017-04-12 LAB — CBC WITH DIFFERENTIAL/PLATELET
Basophils Absolute: 0 10*3/uL (ref 0.0–0.1)
Basophils Relative: 0 %
EOS PCT: 0 %
Eosinophils Absolute: 0 10*3/uL (ref 0.0–0.7)
HCT: 40.8 % (ref 36.0–46.0)
Hemoglobin: 13.7 g/dL (ref 12.0–15.0)
LYMPHS ABS: 1.6 10*3/uL (ref 0.7–4.0)
LYMPHS PCT: 18 %
MCH: 28.1 pg (ref 26.0–34.0)
MCHC: 33.6 g/dL (ref 30.0–36.0)
MCV: 83.6 fL (ref 78.0–100.0)
MONO ABS: 0.3 10*3/uL (ref 0.1–1.0)
MONOS PCT: 3 %
Neutro Abs: 7.2 10*3/uL (ref 1.7–7.7)
Neutrophils Relative %: 79 %
PLATELETS: 227 10*3/uL (ref 150–400)
RBC: 4.88 MIL/uL (ref 3.87–5.11)
RDW: 12.1 % (ref 11.5–15.5)
WBC: 9.1 10*3/uL (ref 4.0–10.5)

## 2017-04-12 LAB — URINALYSIS, ROUTINE W REFLEX MICROSCOPIC
Bacteria, UA: NONE SEEN
Bilirubin Urine: NEGATIVE
GLUCOSE, UA: 50 mg/dL — AB
Ketones, ur: 80 mg/dL — AB
LEUKOCYTES UA: NEGATIVE
NITRITE: NEGATIVE
PROTEIN: 30 mg/dL — AB
Specific Gravity, Urine: 1.019 (ref 1.005–1.030)
pH: 5 (ref 5.0–8.0)

## 2017-04-12 LAB — RAPID URINE DRUG SCREEN, HOSP PERFORMED
AMPHETAMINES: NOT DETECTED
BENZODIAZEPINES: NOT DETECTED
Barbiturates: NOT DETECTED
COCAINE: NOT DETECTED
Opiates: NOT DETECTED
Tetrahydrocannabinol: NOT DETECTED

## 2017-04-12 LAB — MAGNESIUM: MAGNESIUM: 1.3 mg/dL — AB (ref 1.7–2.4)

## 2017-04-12 LAB — PREGNANCY, URINE: PREG TEST UR: NEGATIVE

## 2017-04-12 LAB — CBG MONITORING, ED: GLUCOSE-CAPILLARY: 139 mg/dL — AB (ref 65–99)

## 2017-04-12 MED ORDER — ONDANSETRON HCL 4 MG/2ML IJ SOLN
4.0000 mg | Freq: Once | INTRAMUSCULAR | Status: AC
  Administered 2017-04-12: 4 mg via INTRAVENOUS
  Filled 2017-04-12: qty 2

## 2017-04-12 MED ORDER — METOCLOPRAMIDE HCL 10 MG PO TABS: 10.0000 mg | ORAL_TABLET | Freq: Three times a day (TID) | ORAL | 0 refills | Status: DC

## 2017-04-12 MED ORDER — SODIUM CHLORIDE 0.9 % IV BOLUS (SEPSIS)
1000.0000 mL | Freq: Once | INTRAVENOUS | Status: AC
  Administered 2017-04-12: 1000 mL via INTRAVENOUS

## 2017-04-12 MED ORDER — METOCLOPRAMIDE HCL 5 MG/ML IJ SOLN
10.0000 mg | Freq: Once | INTRAMUSCULAR | Status: AC
  Administered 2017-04-12: 10 mg via INTRAVENOUS
  Filled 2017-04-12: qty 2

## 2017-04-12 MED ORDER — POTASSIUM CHLORIDE 10 MEQ/100ML IV SOLN
10.0000 meq | INTRAVENOUS | Status: AC
  Administered 2017-04-12 (×2): 10 meq via INTRAVENOUS
  Filled 2017-04-12: qty 100

## 2017-04-12 MED ORDER — ONDANSETRON 4 MG PO TBDP: 4.0000 mg | ORAL_TABLET | Freq: Three times a day (TID) | ORAL | 0 refills | Status: DC | PRN

## 2017-04-12 MED ORDER — SODIUM CHLORIDE 0.9 % IV SOLN: Freq: Once | INTRAVENOUS | Status: DC

## 2017-04-12 MED ORDER — MORPHINE SULFATE (PF) 4 MG/ML IV SOLN
4.0000 mg | INTRAVENOUS | Status: DC | PRN
Start: 2017-04-12 — End: 2017-04-12
  Administered 2017-04-12: 4 mg via INTRAVENOUS
  Filled 2017-04-12: qty 1

## 2017-04-12 MED ORDER — MAGNESIUM SULFATE 2 GM/50ML IV SOLN
2.0000 g | Freq: Once | INTRAVENOUS | Status: AC
  Administered 2017-04-12: 2 g via INTRAVENOUS
  Filled 2017-04-12: qty 50

## 2017-04-12 NOTE — ED Provider Notes (Signed)
[facility] EMERGENCY DEPARTMENT Provider Note   CSN: [id number] Arrival date & time: 04/12/17  7793     History   Chief Complaint Chief Complaint  Patient presents with  . Emesis    HPI Lindsey Murillo is a 21 y.o. female. Central evaluation of vomiting. History of type 1 diabetes 12 years.  HPI:  21 year old female. Frequent episodes of vomiting. Type I diabetic since age 74. Currently 21 years old. Vomiting for last 4-5 days. Seen in evaluation in the ER a few days ago with negative CT scan. Mom states that she went home. States she does not have antiemetics at home. P.m. vomiting again yesterday morning through the night. Still taking insulin.  Past Medical History:  Diagnosis Date  . Acanthosis nigricans, acquired   . Asthma   . Diabetic autonomic neuropathy ([place])   . Diabetic peripheral neuropathy ([place])   . Environmental allergies   . Goiter   . Hypoglycemia associated with diabetes ([place])   . Tachycardia   . Thyroiditis, autoimmune   . Type 1 diabetes mellitus in patient age 32-19 years with HbA1C goal below 7.5     Patient Active Problem List   Diagnosis Date Noted  . Dyspepsia   . Weight loss, unintentional 08/26/2016  . Chronic diarrhea 04/09/2016  . Type I diabetes mellitus with complication, uncontrolled ([place]) 02/26/2014  . Noncompliance 02/26/2014  . Essential hypertension, benign 11/30/2012  . Acanthosis nigricans, acquired   . Hypoglycemia associated with diabetes ([place])   . Goiter   . Tachycardia   . Diabetic autonomic neuropathy ([place])   . Diabetic peripheral neuropathy ([place])   . Thyroiditis, autoimmune   . Asthma   . Environmental allergies     Past Surgical History:  Procedure Laterality Date  . BIOPSY  08/27/2016   Procedure: BIOPSY;  Surgeon: Danie Binder, MD;  Location: [facility];  Service: Endoscopy;;  duodenum; gastric  . COLONOSCOPY    . ESOPHAGOGASTRODUODENOSCOPY N/A 08/27/2016   Dr. Oneida Alar: mild gastritis. Negative celiac.  No obvious source for dyspepsia/diarrhea    OB History    Gravida Para Term Preterm AB Living   1             SAB TAB Ectopic Multiple Live Births                   Home Medications    Prior to Admission medications   Medication Sig Start Date End Date Taking? Authorizing Provider  glucagon 1 MG injection Follow package directions for low blood sugar. Patient taking differently: as needed. Follow package directions for low blood sugar. 01/05/12  Yes Sherrlyn Hock, MD  ibuprofen (ADVIL,MOTRIN) 200 MG tablet Take 600 mg by mouth daily as needed.   Yes [provider]  insulin aspart (NOVOLOG FLEXPEN) 100 UNIT/ML FlexPen Inject 8-14 Units into the skin 3 (three) times daily with meals. Patient taking differently: Inject 8-14 Units into the skin 3 (three) times daily with meals. Sliding scale 11/04/15  Yes Nida, Marella Chimes, MD  insulin glargine (LANTUS) 100 UNIT/ML injection Inject 0.16 mLs (16 Units total) into the skin at bedtime. Patient taking differently: Inject 15 Units into the skin at bedtime.  11/04/15  Yes Nida, Marella Chimes, MD  lipase/protease/amylase (CREON) 36000 UNITS CPEP capsule Take 2-4 capsules by mouth 5 (five) times daily. Patient takes 4 capsules with meals and then takes 2 capsules with two snacks 08/27/16  Yes [provider]  vitamin A (VITAMIN A 7500IU  FISH) 7500 UNIT capsule Take 1 capsule (7,500 Units total) by mouth daily. 08/26/16  Yes Fields, Marga Melnick, MD  vitamin E (VITAMIN E) 400 UNIT capsule Take 1 capsule (400 Units total) by mouth daily. 08/26/16  Yes Fields, Sandi L, MD  Blood Glucose Monitoring Suppl (ACCU-CHEK AVIVA) device Use as instructed 06/03/15   Cassandria Anger, MD  metoCLOPramide (REGLAN) 10 MG tablet Take 1 tablet (10 mg total) by mouth 4 (four) times daily -  before meals and at bedtime. 04/12/17   Tanna Furry, MD  ondansetron (ZOFRAN ODT) 4 MG disintegrating tablet Take 1 tablet (4 mg total) by mouth every 8 (eight)  hours as needed for nausea. 04/12/17   Tanna Furry, MD    Family History Family History  Problem Relation Age of Onset  . Diabetes Mother        Type II DM  . Thyroid disease Mother   . Diabetes Maternal Grandmother        Type II DM  . Diabetes Cousin        Type II DM  . Colon cancer Neg Hx   . Colon polyps Neg Hx     Social History Social History   Tobacco Use  . Smoking status: Never Smoker  . Smokeless tobacco: Never Used  Substance Use Topics  . Alcohol use: No  . Drug use: No     Allergies   Patient has no known allergies.   Review of Systems Review of Systems  Constitutional: Negative for appetite change, chills, diaphoresis, fatigue and fever.  HENT: Negative for mouth sores, sore throat and trouble swallowing.   Eyes: Negative for visual disturbance.  Respiratory: Negative for cough, chest tightness, shortness of breath and wheezing.   Cardiovascular: Negative for chest pain.  Gastrointestinal: Positive for nausea and vomiting. Negative for abdominal distention, abdominal pain and diarrhea.  Endocrine: Negative for polydipsia, polyphagia and polyuria.  Genitourinary: Negative for dysuria, frequency and hematuria.  Musculoskeletal: Negative for gait problem.  Skin: Negative for color change, pallor and rash.  Neurological: Negative for dizziness, syncope, light-headedness and headaches.  Hematological: Does not bruise/bleed easily.  Psychiatric/Behavioral: Negative for behavioral problems and confusion.     Physical Exam Updated Vital Signs BP (!) 124/92   Pulse (!) 101   Temp 98.3 F (36.8 C) (Oral)   Resp 16   Ht 5\' 7"  (1.702 m)   Wt 49.9 kg (110 lb)   LMP 04/10/2017   SpO2 98%   BMI 17.23 kg/m   Physical Exam  Constitutional: She is oriented to person, place, and time. She appears well-developed and well-nourished. No distress.  HENT:  Head: Normocephalic.  Eyes: Conjunctivae are normal. Pupils are equal, round, and reactive to light. No  scleral icterus.  Neck: Normal range of motion. Neck supple. No thyromegaly present.  Cardiovascular: Normal rate and regular rhythm. Exam reveals no gallop and no friction rub.  No murmur heard. Pulmonary/Chest: Effort normal and breath sounds normal. No respiratory distress. She has no wheezes. She has no rales.  Abdominal: Soft. Bowel sounds are normal. She exhibits no distension. There is no tenderness. There is no rebound.  Soft benign abdomen. Bowel sounds present. No guarding or rebound.  Musculoskeletal: Normal range of motion.  Neurological: She is alert and oriented to person, place, and time.  Skin: Skin is warm and dry. No rash noted.  Psychiatric: She has a normal mood and affect. Her behavior is normal.     ED Treatments / Results  Labs (all labs ordered are listed, but only abnormal results are displayed) Labs Reviewed  COMPREHENSIVE METABOLIC PANEL - Abnormal; Notable for the following components:      Result Value   Potassium 3.0 (*)    Chloride 92 (*)    Glucose, Bld 127 (*)    Alkaline Phosphatase 137 (*)    Total Bilirubin 1.4 (*)    All other components within normal limits  URINALYSIS, ROUTINE W REFLEX MICROSCOPIC - Abnormal; Notable for the following components:   APPearance HAZY (*)    Glucose, UA 50 (*)    Hgb urine dipstick LARGE (*)    Ketones, ur 80 (*)    Protein, ur 30 (*)    Squamous Epithelial / LPF 0-5 (*)    All other components within normal limits  MAGNESIUM - Abnormal; Notable for the following components:   Magnesium 1.3 (*)    All other components within normal limits  CBG MONITORING, ED - Abnormal; Notable for the following components:   Glucose-Capillary 139 (*)    All other components within normal limits  CBC WITH DIFFERENTIAL/PLATELET  LIPASE, BLOOD  PREGNANCY, URINE  RAPID URINE DRUG SCREEN, HOSP PERFORMED    EKG  EKG Interpretation None       Radiology Dg Chest 2 View  Result Date: 04/10/2017 CLINICAL DATA:  Patient  c/o upper abd pain with nausea and vomiting that started 2 days ago. Denies any diarrhea, fevers, or urinary symptoms. Per patient "hurts to take a deep breath due to pain." HX of "pancreatic issues" per mottherHISTORY OF HTN, ASTHMA EXAM: CHEST  2 VIEW COMPARISON:  05/03/2005 FINDINGS: The heart size and mediastinal contours are within normal limits. Both lungs are clear. No pleural effusion or pneumothorax. The visualized skeletal structures are unremarkable. IMPRESSION: No active cardiopulmonary disease. Electronically Signed   By: Lajean Manes M.D.   On: 04/10/2017 16:19   Ct Abdomen Pelvis W Contrast  Result Date: 04/10/2017 CLINICAL DATA:  21 year old female with abdominal pain with nausea and vomiting for 2 days. EXAM: CT ABDOMEN AND PELVIS WITH CONTRAST TECHNIQUE: Multidetector CT imaging of the abdomen and pelvis was performed using the standard protocol following bolus administration of intravenous contrast. CONTRAST:  112mL ISOVUE-300 IOPAMIDOL (ISOVUE-300) INJECTION 61% COMPARISON:  11/03/2015 CT FINDINGS: Lower chest: Unremarkable Hepatobiliary: The liver and gallbladder are unremarkable. No biliary dilatation. Pancreas: A 2.5 x 4.3 cm cyst adjacent to the pancreatic tail is unchanged. A few scattered pancreatic calcifications are noted. The remainder the pancreas is unremarkable. No evidence of adjacent inflammation. Spleen: Unremarkable Adrenals/Urinary Tract: The kidneys, adrenal glands and bladder are unremarkable. Stomach/Bowel: Stomach is within normal limits. Appendix appears normal. No evidence of bowel wall thickening, distention, or inflammatory changes. Vascular/Lymphatic: No significant vascular findings are present. No enlarged abdominal or pelvic lymph nodes. Reproductive: A 3.5 cm right ovarian cyst is present. Septate uterus identified. The left adnexal region is unremarkable. Other: No ascites, pneumoperitoneum or abdominal wall hernia. Musculoskeletal: No acute or significant  osseous findings. IMPRESSION: 1. 3.5 cm right ovarian cyst appears benign.  No free fluid. 2. No other acute abnormalities 3. Stable peripancreatic cyst likely representing a pseudocyst. 4. Septate uterus Electronically Signed   By: Margarette Canada M.D.   On: 04/10/2017 20:54    Procedures Procedures (including critical care time)  Medications Ordered in ED Medications  0.9 %  sodium chloride infusion (not administered)  morphine 4 MG/ML injection 4 mg (4 mg Intravenous Given 04/12/17 1112)  potassium chloride 10 mEq  in 100 mL IVPB (10 mEq Intravenous New Bag/Given 04/12/17 1347)  sodium chloride 0.9 % bolus 1,000 mL (1,000 mLs Intravenous New Bag/Given 04/12/17 1111)  metoCLOPramide (REGLAN) injection 10 mg (10 mg Intravenous Given 04/12/17 1111)  ondansetron (ZOFRAN) injection 4 mg (4 mg Intravenous Given 04/12/17 1111)  magnesium sulfate IVPB 2 g 50 mL (0 g Intravenous Stopped 04/12/17 1346)     Initial Impression / Assessment and Plan / ED Course  I have reviewed the triage vital signs and the nursing notes.  Pertinent labs & imaging results that were available during my care of the patient were reviewed by me and considered in my medical decision making (see chart for details).    IV placed. Given normal saline 2 L. Given Zofran 4, Reglan 10 morphine 1 dose 4 mg. Given IV potassium 20 mg over 2 hours, and magnesium replacement. Feeling much improved.  Final Clinical Impressions(s) / ED Diagnoses   Final diagnoses:  Non-intractable vomiting with nausea, unspecified vomiting type   Is not hyperglycemic or acidotic. His tolerating by mouth liquids. Asking first "something to eat". Given scopolamine patch 72 hours. This place in the ER. Discharge home. Encouraged to slowly improve her diet from liquids through [place] through solids as improving. Primary care follow-up   ED Discharge Orders        Ordered    metoCLOPramide (REGLAN) 10 MG tablet  3 times daily before meals & bedtime      04/12/17 1335    ondansetron (ZOFRAN ODT) 4 MG disintegrating tablet  Every 8 hours PRN     04/12/17 1335       Tanna Furry, MD 04/12/17 1446

## 2017-04-12 NOTE — ED Notes (Signed)
Pt given saltine crackers per MD approval.

## 2017-04-12 NOTE — ED Triage Notes (Signed)
PT c/o continued upper abdomen pain with nausea and vomiting x4 days with ED visit 2 days ago.

## 2017-04-12 NOTE — Discharge Instructions (Signed)
Fluids only today.  Reglan 4 times per day as prescribed.  Zofran for additional nausea.

## 2017-04-15 ENCOUNTER — Ambulatory Visit (INDEPENDENT_AMBULATORY_CARE_PROVIDER_SITE_OTHER): Payer: BLUE CROSS/BLUE SHIELD | Admitting: Nurse Practitioner

## 2017-04-15 ENCOUNTER — Encounter: Payer: Self-pay | Admitting: Nurse Practitioner

## 2017-04-15 ENCOUNTER — Ambulatory Visit (HOSPITAL_COMMUNITY)
Admission: RE | Admit: 2017-04-15 | Discharge: 2017-04-15 | Disposition: A | Discharge status: Home / Self Care | Payer: BLUE CROSS/BLUE SHIELD | Source: Ambulatory Visit | Attending: Nurse Practitioner | Admitting: Nurse Practitioner

## 2017-04-15 VITALS — BP 102/62 | Ht 69.0 in | Wt 105.2 lb

## 2017-04-15 DIAGNOSIS — M25521 Pain in right elbow: Secondary | ICD-10-CM | POA: Diagnosis not present

## 2017-04-15 DIAGNOSIS — S50311A Abrasion of right elbow, initial encounter: Secondary | ICD-10-CM | POA: Diagnosis not present

## 2017-04-15 DIAGNOSIS — X58XXXA Exposure to other specified factors, initial encounter: Secondary | ICD-10-CM | POA: Insufficient documentation

## 2017-04-15 DIAGNOSIS — R0789 Other chest pain: Secondary | ICD-10-CM

## 2017-04-15 DIAGNOSIS — S20212A Contusion of left front wall of thorax, initial encounter: Secondary | ICD-10-CM | POA: Diagnosis not present

## 2017-04-15 MED ORDER — HYDROCODONE-ACETAMINOPHEN 5-325 MG PO TABS: 1.0000 | ORAL_TABLET | ORAL | 0 refills | Status: DC | PRN

## 2017-04-15 NOTE — Patient Instructions (Signed)
Lidocaine patch biofreeze Heat applications

## 2017-04-17 ENCOUNTER — Encounter: Payer: Self-pay | Admitting: Nurse Practitioner

## 2017-04-17 NOTE — Progress Notes (Signed)
Subjective: Presents for recheck after falling after leaving an ED visit on 12/2. Was very unsteady, fell getting out of the car at home. Scraped her right elbow and hit her left chest wall. Has been sore since then. Pain makes it difficult to sleep at night. No fever or cough. BS 120 this am. Being followed by endocrinology for Type 1 diabetes.   Objective:   BP 102/62   Ht 5\' 9"  (1.753 m)   Wt 105 lb 3.2 oz (47.7 kg)   LMP 04/10/2017   BMI 15.54 kg/m  NAD. Alert, oriented. Lungs clear. Heart RRR. Faint ecchymoses noted on left lower anterior chest wall, tender to palpation. Abdomen soft, non tender. Superficial, healing wound right elbow. Chest xray normal.   Assessment:  Left-sided chest wall pain - Plan: DG Chest 2 View  Abrasion of right elbow, initial encounter - Plan: DG Chest 2 View  Contusion of left chest wall, initial encounter    Plan:   Meds ordered this encounter  Medications  . HYDROcodone-acetaminophen (NORCO/VICODIN) 5-325 MG tablet    Sig: Take 1 tablet by mouth every 4 (four) hours as needed.    Dispense:  18 tablet    Refill:  0    Order Specific Question:   Supervising Provider    Answer:   Maggie Font  PMP reviewed. One time Rx to use sparingly for severe pain. NSAIDs as directed. Continue heat applications. Expect gradual resolution. Tetanus vaccine given today.  Return if symptoms worsen or fail to improve.

## 2017-04-20 ENCOUNTER — Encounter: Payer: Self-pay | Admitting: Gastroenterology

## 2017-07-28 ENCOUNTER — Encounter: Payer: Self-pay | Admitting: Gastroenterology

## 2017-07-28 ENCOUNTER — Ambulatory Visit: Payer: BLUE CROSS/BLUE SHIELD | Admitting: Gastroenterology

## 2017-07-28 DIAGNOSIS — K529 Noninfective gastroenteritis and colitis, unspecified: Secondary | ICD-10-CM

## 2017-07-28 DIAGNOSIS — R1013 Epigastric pain: Secondary | ICD-10-CM

## 2017-07-28 DIAGNOSIS — R634 Abnormal weight loss: Secondary | ICD-10-CM

## 2017-07-28 MED ORDER — DICYCLOMINE HCL 10 MG PO CAPS
ORAL_CAPSULE | ORAL | 11 refills | Status: DC
Start: 2017-07-28 — End: 2018-05-27

## 2017-07-28 NOTE — Assessment & Plan Note (Signed)
SYMPTOMS NOT IDEALLY CONTROLLED.  ADD BENTYL CONTINUE CREON AND LACTASE. CONTINUE TO MONITOR SYMPTOMS. FOLLOW UP IN 6 MOS.

## 2017-07-28 NOTE — Progress Notes (Signed)
Subjective:    Patient ID: Lindsey Murillo, female    DOB: 1995-09-23, 23 y.o.   MRN: [id number]  Mikey Kirschner, MD   HPI HAVING symptoms: BOWEL IRREGULARITY 1-2 days a week. Symptoms: #4 5- times a day and then eventually #7. May have accidents. USING ZOFRAN ONCE A WEEK. NAUSEA: BETTER. LAST VOMITED:  LONG TIME AGO. ABDOMINAL PAIN(MIDDLE TOWARD RIB CAGE): SOME DAYS WORSE AND MAY HAVE 3-4 TIMES A WEEK. MAY HAVE CHEST PAIN OFF AND ON A LITTLE BIT. HEARTBURN: 2 DAYS A WEEK. TAKES TYLENOL OR ADVIL & IT HELPS SOMETIMES. MOST OF HER PROBLEMS DURING THE DAY. AND SOMETIMES UP AT NIGHT TO HAVE A BM(3).   PT DENIES FEVER, CHILLS, HEMATOCHEZIA, HEMATEMESIS, melena, CHEST PAIN, SHORTNESS OF BREATH,  CHANGE IN BOWEL IN HABITS, constipation, OR problems swallowing.   Past Medical History:  Diagnosis Date  . Acanthosis nigricans, acquired   . Asthma   . Diabetic autonomic neuropathy ([place])   . Diabetic peripheral neuropathy ([place])   . Environmental allergies   . Goiter   . Hypoglycemia associated with diabetes ([place])   . Tachycardia   . Thyroiditis, autoimmune   . Type 1 diabetes mellitus in patient age 57-19 years with HbA1C goal below 7.5     Past Surgical History:  Procedure Laterality Date  . BIOPSY  08/27/2016   Procedure: BIOPSY;  Surgeon: Danie Binder, MD;  Location[facility];  Service: Endoscopy;;  duodenum; gastric  . COLONOSCOPY    . ESOPHAGOGASTRODUODENOSCOPY N/A 08/27/2016   Dr. Oneida Alar: mild gastritis. Negative celiac. No obvious source for dyspepsia/diarrhea   No Known Allergies  Current Outpatient Medications  Medication Sig    . glucagon 1 MG injection Follow package directions for low blood sugar. (Patient taking differently: as needed. Follow package directions for low blood sugar.)    . HYDROcodone-acetaminophen (NORCO/VICODIN) 5-325 MG tablet Take 1 tablet by mouth every 4 (four) hours as needed.    Marland Kitchen ibuprofen (ADVIL,MOTRIN) 200 MG tablet Take 600 mg by  mouth daily as needed.    . insulin aspart (NOVOLOG FLEXPEN) 100 UNIT/ML FlexPen Inject 8-14 Units into the skin 3 (three) times daily with meals. (Patient taking differently: Inject 8-14 Units into the skin 3 (three) times daily with meals. Sliding scale)    . insulin glargine (LANTUS) 100 UNIT/ML injection Inject 0.16 mLs (16 Units total) into the skin at bedtime. (Patient taking differently: Inject 15 Units into the skin at bedtime. )    . lipase/protease/amylase (CREON) 36000 UNITS CPEP capsule Take 2-4 capsules by mouth 5 (five) times daily. Patient takes 4 capsules with meals and then takes 2 capsules with two snacks    . ondansetron (ZOFRAN ODT) 4 MG disintegrating tablet Take 1 tablet (4 mg total) by mouth every 8 (eight) hours as needed for nausea.    . vitamin A (VITAMIN A 7500IU FISH) 7500 UNIT capsule Take 1 capsule (7,500 Units total) by mouth daily.    . vitamin E (VITAMIN E) 400 UNIT capsule Take 1 capsule (400 Units total) by mouth daily.    . Blood Glucose Monitoring Suppl (ACCU-CHEK AVIVA) device Use as instructed    .        Review of Systems PER HPI OTHERWISE ALL SYSTEMS ARE NEGATIVE.    Objective:   Physical Exam  Constitutional: She is oriented to person, place, and time. She appears well-developed and well-nourished. No distress.  HENT:  Head: Normocephalic and atraumatic.  Mouth/Throat: Oropharynx is clear  and moist. No oropharyngeal exudate.  Eyes: Pupils are equal, round, and reactive to light. No scleral icterus.  Neck: Normal range of motion. Neck supple.  Cardiovascular: Normal rate, regular rhythm and normal heart sounds.  Pulmonary/Chest: Effort normal and breath sounds normal. No respiratory distress.  Abdominal: Soft. Bowel sounds are normal. She exhibits no distension. There is no tenderness.  Musculoskeletal: She exhibits no edema.  Lymphadenopathy:    She has no cervical adenopathy.  Neurological: She is alert and oriented to person, place, and time.    NO  NEW FOCAL DEFICITS  Psychiatric:  FLAT AFFECT, NL MOOD  Vitals reviewed.     Assessment & Plan:

## 2017-07-28 NOTE — Assessment & Plan Note (Signed)
DUE TO PANCREATIC INSUFFICIENCY AND LACTOSE INTOLERANCE. SYMPTOMS NOT IDEALLY CONTROLLED BUT CLINICALLY IMPROVED.  DRINK WATER TO KEEP YOUR URINE LIGHT YELLOW.  TO PREVENT LOOSE STOOLS:    1. TAKE CREON 4 WITH MEALS AND TWO WITH SNACKS.    2. TAKE DICYCLOMINE 30 MINUTES PRIOR TO BREAKFAST AND AT BEDTIME. IT MAY CAUSE DROWSINESS, DRY EYES/MOUTH, BLURRY VISION, OR DIFFICULTY URINATING.    3. ADD 3 LACTASE PILLS WITH MEALS FOUR TIMES A DAY.  CONTINUE GLUCERNA TWICE DAILY. PLEASE CALL IN ONE MONTH IF SYMPTOMS: DIARRHEA IS NOT IMPROVED.    FOLLOW UP IN 6 MOS.

## 2017-07-28 NOTE — Progress Notes (Signed)
ON RECALL  °

## 2017-07-28 NOTE — Patient Instructions (Addendum)
DRINK WATER TO KEEP YOUR URINE LIGHT YELLOW.  TO PREVENT LOOSE STOOLS:     1. TAKE CREON 4 WITH MEALS AND TWO WITH SNACKS.     2. TAKE DICYCLOMINE 30 MINUTES PRIOR TO BREAKFAST AND AT BEDTIME. IT MAY CAUSE DROWSINESS, DRY EYES/MOUTH, BLURRY VISION, OR DIFFICULTY URINATING.     3. ADD 3 LACTASE PILLS WITH MEALS FOUR TIMES A DAY.  CONTINUE GLUCERNA TWICE DAILY.     PLEASE CALL IN ONE MONTH IF SYMPTOMS: DIARRHEA IS NOT IMPROVED.    FOLLOW UP IN 6 MOS.

## 2017-07-28 NOTE — Progress Notes (Signed)
cc'ed to pcp °

## 2017-07-28 NOTE — Assessment & Plan Note (Signed)
WEIGHT STABLE AT 111-112 LBS.  CONTINUE GLUCERNA BID. FOLLOW UP IN 6 MOS.

## 2017-12-16 ENCOUNTER — Encounter: Payer: Self-pay | Admitting: Internal Medicine

## 2018-03-01 ENCOUNTER — Encounter: Payer: Self-pay | Admitting: Gastroenterology

## 2018-03-01 ENCOUNTER — Ambulatory Visit (INDEPENDENT_AMBULATORY_CARE_PROVIDER_SITE_OTHER): Payer: Self-pay | Admitting: Gastroenterology

## 2018-03-01 DIAGNOSIS — K529 Noninfective gastroenteritis and colitis, unspecified: Secondary | ICD-10-CM

## 2018-03-01 DIAGNOSIS — R634 Abnormal weight loss: Secondary | ICD-10-CM

## 2018-03-01 NOTE — Progress Notes (Signed)
Subjective:    Patient ID: Lindsey Murillo, female    DOB: Oct 05, 1995, 22 y.o.   MRN: [id number]  Mikey Kirschner, MD   HPI BAD DAYs: BACK TO BACK DIARRHEA(FIRST MEAL AND THEN FINE, UP TO 3-4), ASSOCIATED WITH MILD CHEST PAIN. GOOD DAY: 1-2 BMs(STILL DIARRHEA JUST LESS). DIABETIC SINCE 5TH GRADE.    PT DENIES FEVER, CHILLS, HEMATOCHEZIA, HEMATEMESIS, nausea, vomiting, melena, diarrhea, SHORTNESS OF BREATH,  CHANGE IN BOWEL IN HABITS, constipation, abdominal pain, problems swallowing, OR heartburn or indigestion.  Past Medical History:  Diagnosis Date  . Acanthosis nigricans, acquired   . Asthma   . Diabetic autonomic neuropathy ([place])   . Diabetic peripheral neuropathy ([place])   . Environmental allergies   . Goiter   . Hypoglycemia associated with diabetes ([place])   . Tachycardia   . Thyroiditis, autoimmune   . Type 1 diabetes mellitus in patient age 49-19 years with HbA1C goal below 7.5    Past Surgical History:  Procedure Laterality Date  . BIOPSY  08/27/2016   Procedure: BIOPSY;  Surgeon: Danie Binder, MD;  [facility];  Service: Endoscopy;;  duodenum; gastric  . COLONOSCOPY    . ESOPHAGOGASTRODUODENOSCOPY N/A 08/27/2016   Dr. Oneida Alar: mild gastritis. Negative celiac. No obvious source for dyspepsia/diarrhea   No Known Allergies  Current Outpatient Medications  Medication Sig    . Blood Glucose Monitoring Suppl (ACCU-CHEK AVIVA) device Use as instructed    . dicyclomine (BENTYL) 10 MG capsule  1 PO 30 MINUTES PRIOR TO BREAKFAST AND AT BEDTIME) PRN   . glucagon 1 MG injection Patient taking differently: as needed. Follow package directions for low blood sugar.)    . HYDROcodone-acetaminophen (NORCO/VICODIN) 5-325 MG tablet Take 1 tablet by mouth every 4 (four) hours as needed.    Marland Kitchen ibuprofen (ADVIL,MOTRIN) 200 MG tablet Take 600 mg by mouth daily as needed.    . insulin aspart (NOVOLOG FLEXPEN) 100 UNIT/ML FlexPen Inject 8-14 Units into the skin 3 (three)  times daily with meals. (Patient taking differently: Inject 8-14 Units into the skin 3 (three) times daily with meals. Sliding scale)    . insulin glargine (LANTUS) 100 UNIT/ML injection Inject 0.16 mLs (16 Units total) into the skin at bedtime. (Patient taking differently: Inject 15 Units into the skin at bedtime. )    . lipase/protease/amylase (CREON) 36000 UNITS CPEP capsule Take 2-4 capsules by mouth 5 (five) times daily. Patient takes 4 capsules with meals and then takes 2 capsules with two snacks    . ondansetron (ZOFRAN ODT) 4 MG disintegrating tablet Take 1 tablet (4 mg total) by mouth every 8 (eight) hours as needed for nausea.    . vitamin A (VITAMIN A 7500IU FISH) 7500 UNIT capsule Take 1 capsule (7,500 Units total) by mouth daily.    . vitamin E (VITAMIN E) 400 UNIT capsule Take 1 capsule (400 Units total) by mouth daily.     Review of Systems PER HPI OTHERWISE ALL SYSTEMS ARE NEGATIVE.    Objective:   Physical Exam  Constitutional: She is oriented to person, place, and time. She appears well-developed and well-nourished. No distress.  HENT:  Head: Normocephalic and atraumatic.  Mouth/Throat: Oropharynx is clear and moist. No oropharyngeal exudate.  Eyes: Pupils are equal, round, and reactive to light. No scleral icterus.  Neck: Normal range of motion. Neck supple.  Cardiovascular: Normal rate, regular rhythm and normal heart sounds.  Pulmonary/Chest: Effort normal and breath sounds normal. No respiratory distress.  Abdominal: Soft. Bowel sounds are normal. She exhibits no distension. There is tenderness. There is no rebound and no guarding.  MILD TTP IN THE EPIGASTRIUM & BUQs   Musculoskeletal: She exhibits no edema.  Lymphadenopathy:    She has no cervical adenopathy.  Neurological: She is alert and oriented to person, place, and time.  NO  NEW FOCAL DEFICITS  Psychiatric:  FLAT AFFECT, NL MOOD  Vitals reviewed.     Assessment & Plan:

## 2018-03-01 NOTE — Patient Instructions (Addendum)
TO REDUCE EPISODE OF DIARRHEA:    1. FOLLOW A LOW FAT DIET. AVOID FRIED FOODS.MEATS SHOULD BE BAKED, BROILED, OR BOILED.    2. TAKE CREON 72K 2 PILLS WITH MEALS AND 1 ONE WITH SNACKS.    3. USE DICYCLOMINE IF YOU ARE HAVING FLARES ONCE OR TWICE A DAY   COMPLETE LABS WITHIN 7 DAYS. YOUR RESULTS WILL BE BACK IN 5 BUSINESS DAYS AFTER YOUR LABS ARE DRAWN.   PLEASE CALL IN ONE MONTH IF SYMPTOMS ARE NOT IMPROVED.   FOLLOW UP IN 4 MOS.   Low-Fat Diet BREADS, CEREALS, PASTA, RICE, DRIED PEAS, AND BEANS These products are high in carbohydrates and most are low in fat. Therefore, they can be increased in the diet as substitutes for fatty foods. They too, however, contain calories and should not be eaten in excess. Cereals can be eaten for snacks as well as for breakfast.   FRUITS AND VEGETABLES It is good to eat fruits and vegetables. Besides being sources of fiber, both are rich in vitamins and some minerals. They help you get the daily allowances of these nutrients. Fruits and vegetables can be used for snacks and desserts.  MEATS Limit lean meat, chicken, [place], and fish to no more than 6 ounces per day. Beef, Pork, and Lamb Use lean cuts of beef, pork, and lamb. Lean cuts include:  Extra-lean ground beef.  Arm roast.  Sirloin tip.  Center-cut ham.  Round steak.  Loin chops.  Rump roast.  Tenderloin.  Trim all fat off the outside of meats before cooking. It is not necessary to severely decrease the intake of red meat, but lean choices should be made. Lean meat is rich in protein and contains a highly absorbable form of iron. Premenopausal women, in particular, should avoid reducing lean red meat because this could increase the risk for low red blood cells (iron-deficiency anemia).  Chicken and [place] These are good sources of protein. The fat of poultry can be reduced by removing the skin and underlying fat layers before cooking. Chicken and [place] can be substituted for lean red meat in  the diet. Poultry should not be fried or covered with high-fat sauces. Fish and Shellfish Fish is a good source of protein. Shellfish contain cholesterol, but they usually are low in saturated fatty acids. The preparation of fish is important. Like chicken and [place], they should not be fried or covered with high-fat sauces. EGGS Egg whites contain no fat or cholesterol. They can be eaten often. Try 1 to 2 egg whites instead of whole eggs in recipes or use egg substitutes that do not contain yolk. MILK AND DAIRY PRODUCTS Use skim or 1% milk instead of 2% or whole milk. Decrease whole milk, natural, and processed cheeses. Use nonfat or low-fat (2%) cottage cheese or low-fat cheeses made from vegetable oils. Choose nonfat or low-fat (1 to 2%) yogurt. Experiment with evaporated skim milk in recipes that call for heavy cream. Substitute low-fat yogurt or low-fat cottage cheese for sour cream in dips and salad dressings. Have at least 2 servings of low-fat dairy products, such as 2 glasses of skim (or 1%) milk each day to help get your daily calcium intake. FATS AND OILS Reduce the total intake of fats, especially saturated fat. Butterfat, lard, and beef fats are high in saturated fat and cholesterol. These should be avoided as much as possible. Vegetable fats do not contain cholesterol, but certain vegetable fats, such as coconut oil, palm oil, and palm kernel oil  are very high in saturated fats. These should be limited. These fats are often used in bakery goods, processed foods, popcorn, oils, and nondairy creamers. Vegetable shortenings and some peanut butters contain hydrogenated oils, which are also saturated fats. Read the labels on these foods and check for saturated vegetable oils. Unsaturated vegetable oils and fats do not raise blood cholesterol. However, they should be limited because they are fats and are high in calories. Total fat should still be limited to 30% of your daily caloric intake.  Desirable liquid vegetable oils are corn oil, cottonseed oil, olive oil, canola oil, safflower oil, soybean oil, and sunflower oil. Peanut oil is not as good, but small amounts are acceptable. Buy a heart-healthy tub margarine that has no partially hydrogenated oils in the ingredients. Mayonnaise and salad dressings often are made from unsaturated fats, but they should also be limited because of their high calorie and fat content. Seeds, nuts, peanut butter, olives, and avocados are high in fat, but the fat is mainly the unsaturated type. These foods should be limited mainly to avoid excess calories and fat. OTHER EATING TIPS Snacks  Most sweets should be limited as snacks. They tend to be rich in calories and fats, and their caloric content outweighs their nutritional value. Some good choices in snacks are graham crackers, melba toast, soda crackers, bagels (no egg), English muffins, fruits, and vegetables. These snacks are preferable to snack crackers, [place] fries, TORTILLA CHIPS, and POTATO chips. Popcorn should be air-popped or cooked in small amounts of liquid vegetable oil. Desserts Eat fruit, low-fat yogurt, and fruit ices instead of pastries, cake, and cookies. Sherbet, angel food cake, gelatin dessert, frozen low-fat yogurt, or other frozen products that do not contain saturated fat (pure fruit juice bars, frozen ice pops) are also acceptable.  COOKING METHODS Choose those methods that use little or no fat. They include: Poaching.  Braising.  Steaming.  Grilling.  Baking.  Stir-frying.  Broiling.  Microwaving.  Foods can be cooked in a nonstick pan without added fat, or use a nonfat cooking spray in regular cookware. Limit fried foods and avoid frying in saturated fat. Add moisture to lean meats by using water, broth, cooking wines, and other nonfat or low-fat sauces along with the cooking methods mentioned above. Soups and stews should be chilled after cooking. The fat that forms on top  after a few hours in the refrigerator should be skimmed off. When preparing meals, avoid using excess salt. Salt can contribute to raising blood pressure in some people.  EATING AWAY FROM HOME Order entres, potatoes, and vegetables without sauces or butter. When meat exceeds the size of a deck of cards (3 to 4 ounces), the rest can be taken home for another meal. Choose vegetable or fruit salads and ask for low-calorie salad dressings to be served on the side. Use dressings sparingly. Limit high-fat toppings, such as bacon, crumbled eggs, cheese, sunflower seeds, and olives. Ask for heart-healthy tub margarine instead of butter.

## 2018-03-01 NOTE — Assessment & Plan Note (Signed)
WEIGHT UP 7 LBS SINCE MAR 2019.  CONTINUE TO MONITOR SYMPTOMS.

## 2018-03-01 NOTE — Assessment & Plan Note (Signed)
MOST LIKELY DUE TO DIABTETIC ENTEROPATHY AND/OR PANCREATIC INSUFFICIENCY, LESS LIKELY SMALL INTESTINE BACTERIAL OVERGROWTH.  TO REDUCE EPISODE OF DIARRHEA:    1. FOLLOW A LOW FAT DIET. AVOID FRIED FOODS.MEATS SHOULD BE BAKED, BROILED, OR BOILED.    2. TAKE CREON 72K 2 PILLS WITH MEALS AND 1 ONE WITH SNACKS.    3. USE DICYCLOMINE IF YOU ARE HAVING FLARES ONCE OR TWICE A DAY  COMPLETE LABS WITHIN 7 DAYS. YOUR RESULTS WILL BE BACK IN 5 BUSINESS DAYS AFTER YOUR LABS ARE DRAWN. CALL IN ONE MONTH IF SYMPTOMS ARE NOT IMPROVED. WILL CONSIDER A COURSE OF ABX FOR SMALL INTESTINE BACTERIAL OVERGROWTH. FOLLOW UP IN 4 MOS.

## 2018-03-01 NOTE — Progress Notes (Signed)
On recall  °

## 2018-03-01 NOTE — Progress Notes (Signed)
CC'D TO PCP °

## 2018-05-24 ENCOUNTER — Encounter: Payer: Self-pay | Admitting: Gastroenterology

## 2018-05-27 ENCOUNTER — Emergency Department (HOSPITAL_COMMUNITY)
Admission: EM | Admit: 2018-05-27 | Discharge: 2018-05-27 | Disposition: A | Discharge status: Home / Self Care | Payer: PRIVATE HEALTH INSURANCE | Attending: Emergency Medicine | Admitting: Emergency Medicine

## 2018-05-27 ENCOUNTER — Emergency Department (HOSPITAL_COMMUNITY): Payer: PRIVATE HEALTH INSURANCE

## 2018-05-27 ENCOUNTER — Encounter (HOSPITAL_COMMUNITY): Payer: Self-pay | Admitting: Emergency Medicine

## 2018-05-27 ENCOUNTER — Other Ambulatory Visit: Payer: Self-pay

## 2018-05-27 DIAGNOSIS — I10 Essential (primary) hypertension: Secondary | ICD-10-CM | POA: Insufficient documentation

## 2018-05-27 DIAGNOSIS — R739 Hyperglycemia, unspecified: Secondary | ICD-10-CM

## 2018-05-27 DIAGNOSIS — E1065 Type 1 diabetes mellitus with hyperglycemia: Secondary | ICD-10-CM | POA: Insufficient documentation

## 2018-05-27 DIAGNOSIS — R600 Localized edema: Secondary | ICD-10-CM | POA: Diagnosis not present

## 2018-05-27 DIAGNOSIS — E0590 Thyrotoxicosis, unspecified without thyrotoxic crisis or storm: Secondary | ICD-10-CM | POA: Insufficient documentation

## 2018-05-27 DIAGNOSIS — E1040 Type 1 diabetes mellitus with diabetic neuropathy, unspecified: Secondary | ICD-10-CM | POA: Diagnosis not present

## 2018-05-27 DIAGNOSIS — Z9119 Patient's noncompliance with other medical treatment and regimen: Secondary | ICD-10-CM | POA: Insufficient documentation

## 2018-05-27 DIAGNOSIS — L03115 Cellulitis of right lower limb: Secondary | ICD-10-CM | POA: Insufficient documentation

## 2018-05-27 DIAGNOSIS — J45909 Unspecified asthma, uncomplicated: Secondary | ICD-10-CM | POA: Diagnosis not present

## 2018-05-27 DIAGNOSIS — R2241 Localized swelling, mass and lump, right lower limb: Secondary | ICD-10-CM | POA: Diagnosis present

## 2018-05-27 LAB — BASIC METABOLIC PANEL
ANION GAP: 13 (ref 5–15)
BUN: 16 mg/dL (ref 6–20)
CALCIUM: 9.5 mg/dL (ref 8.9–10.3)
CO2: 28 mmol/L (ref 22–32)
CREATININE: 0.59 mg/dL (ref 0.44–1.00)
Chloride: 93 mmol/L — ABNORMAL LOW (ref 98–111)
GFR calc non Af Amer: >60 mL/min (ref >60)
GLUCOSE: 415 mg/dL — AB (ref 70–99)
Potassium: 3.5 mmol/L (ref 3.5–5.1)
Sodium: 134 mmol/L — ABNORMAL LOW (ref 135–145)

## 2018-05-27 LAB — CBC WITH DIFFERENTIAL/PLATELET
Abs Immature Granulocytes: 0.02 10*3/uL (ref 0.00–0.07)
BASOS ABS: 0 10*3/uL (ref 0.0–0.1)
BASOS PCT: 0 %
EOS ABS: 0.2 10*3/uL (ref 0.0–0.5)
Eosinophils Relative: 2 %
HCT: 38.4 % (ref 36.0–46.0)
Hemoglobin: 12.2 g/dL (ref 12.0–15.0)
IMMATURE GRANULOCYTES: 0 %
Lymphocytes Relative: 17 %
Lymphs Abs: 1.8 10*3/uL (ref 0.7–4.0)
MCH: 26.5 pg (ref 26.0–34.0)
MCHC: 31.8 g/dL (ref 30.0–36.0)
MCV: 83.3 fL (ref 80.0–100.0)
Monocytes Absolute: 0.5 10*3/uL (ref 0.1–1.0)
Monocytes Relative: 5 %
NEUTROS PCT: 76 %
Neutro Abs: 7.9 10*3/uL — ABNORMAL HIGH (ref 1.7–7.7)
PLATELETS: 232 10*3/uL (ref 150–400)
RBC: 4.61 MIL/uL (ref 3.87–5.11)
RDW: 13.2 % (ref 11.5–15.5)
WBC: 10.3 10*3/uL (ref 4.0–10.5)
nRBC: 0 % (ref 0.0–0.2)

## 2018-05-27 LAB — TSH: TSH: 0.342 u[IU]/mL — ABNORMAL LOW (ref 0.350–4.500)

## 2018-05-27 MED ORDER — INSULIN ASPART 100 UNIT/ML ~~LOC~~ SOLN
10.0000 [IU] | Freq: Once | SUBCUTANEOUS | Status: AC
  Administered 2018-05-27: 10 [IU] via SUBCUTANEOUS
  Filled 2018-05-27: qty 1

## 2018-05-27 MED ORDER — SODIUM CHLORIDE 0.9 % IV BOLUS
1000.0000 mL | Freq: Once | INTRAVENOUS | Status: AC
  Administered 2018-05-27: 1000 mL via INTRAVENOUS

## 2018-05-27 MED ORDER — SULFAMETHOXAZOLE-TRIMETHOPRIM 800-160 MG PO TABS: 1.0000 | ORAL_TABLET | Freq: Two times a day (BID) | ORAL | 0 refills | Status: AC

## 2018-05-27 MED ORDER — VANCOMYCIN HCL IN DEXTROSE 1-5 GM/200ML-% IV SOLN
1000.0000 mg | Freq: Once | INTRAVENOUS | Status: AC
  Administered 2018-05-27: 1000 mg via INTRAVENOUS
  Filled 2018-05-27: qty 200

## 2018-05-27 MED ORDER — KETOROLAC TROMETHAMINE 30 MG/ML IJ SOLN
15.0000 mg | Freq: Once | INTRAMUSCULAR | Status: AC
  Administered 2018-05-27: 15 mg via INTRAVENOUS
  Filled 2018-05-27: qty 1

## 2018-05-27 NOTE — Discharge Instructions (Signed)
Thank you for letting us be a part of your care this evening.  We have found several abnormal findings including what looks like an infection on your right foot which is causing some swelling redness and the blister.  We have started you on medications this evening to treat the infection and have sent some medicine to your pharmacy.  It is called Bactrim, take 1 tablet twice a day for the next 10 days.  I would encourage you to take a picture of your foot every 24 hours to show the progression of the improvement or to document if it is getting worse.  You are to see your family doctor within 48 hours at the minimum for a recheck.  We have also found that you have high blood sugar this evening.  This may be in part related to not taking her medications exactly as prescribed or also contributing would be the infection in your foot.  Either way we have given you insulin to treat the hyperglycemia and I would encourage you to take your normal daily medications when you get home including your insulin.  You have told me that you have this medication at home and do not need a refill, talk to your doctor about insulin dosing when you see them in 2 days.  Lastly we have found that your thyroid function is abnormal.  This is something that must be followed up by your family doctor, this may be in part of the reason that you have a slight elevation in your heart rate this evening as well as some swelling in your legs.  This must be followed up this week as well.  You are more than welcome to return to the emergency department at any time should you feel like your symptoms are worsening or not improving.

## 2018-05-27 NOTE — ED Triage Notes (Signed)
Pt c/o blister to RT foot. Pt also has redness and edema to both feet. Pt states she is a diabetic. Denies fever/chills.

## 2018-05-27 NOTE — ED Provider Notes (Signed)
[facility] EMERGENCY DEPARTMENT Provider Note   CSN: [id number] Arrival date & time: 05/27/18  1801     History   Chief Complaint Chief Complaint  Patient presents with  . Foot Swelling    HPI [name] is a 23 y.o. female.  HPI  The patient is a 23 year old female, she has a known history of diabetic neuropathy Neuropathy as well as having autoimmune thyroiditis and has been a type I diabetic since the age of 58.  She has had a very high A1c, that she reports though I cannot find this in the labs for the last couple of years.  She states that she has had some chronic swelling of her legs worse over the last couple of weeks especially below the knees and over the last 24 hours has developed a large blister on the medial aspect of the distal right foot overlying the MTP joint.  She states that she has no fevers or systemic symptoms, her blood sugars have been running just under 200 but she states that she is not very good about taking her medications.  She has noticed increased redness and warmth to the right foot as well.  Symptoms are gradually worsening and becoming constant though her pain is minimal.  She has not had an evaluation with the physician prior to today  Past Medical History:  Diagnosis Date  . Acanthosis nigricans, acquired   . Asthma   . Diabetic autonomic neuropathy ([place])   . Diabetic peripheral neuropathy ([place])   . Environmental allergies   . Goiter   . Hypoglycemia associated with diabetes ([place])   . Tachycardia   . Thyroiditis, autoimmune   . Type 1 diabetes mellitus in patient age 36-19 years with HbA1C goal below 7.5     Patient Active Problem List   Diagnosis Date Noted  . Dyspepsia   . Weight loss, unintentional 08/26/2016  . Chronic diarrhea 04/09/2016  . Type I diabetes mellitus with complication, uncontrolled ([place]) 02/26/2014  . Noncompliance 02/26/2014  . Essential hypertension, benign 11/30/2012  . Acanthosis nigricans, acquired   .  Hypoglycemia associated with diabetes ([place])   . Goiter   . Tachycardia   . Diabetic autonomic neuropathy ([place])   . Diabetic peripheral neuropathy ([place])   . Thyroiditis, autoimmune   . Asthma   . Environmental allergies     Past Surgical History:  Procedure Laterality Date  . BIOPSY  08/27/2016   Procedure: BIOPSY;  Surgeon: Danie Binder, MD;  Location: [facility];  Service: Endoscopy;;  duodenum; gastric  . COLONOSCOPY    . ESOPHAGOGASTRODUODENOSCOPY N/A 08/27/2016   Dr. Oneida Alar: mild gastritis. Negative celiac. No obvious source for dyspepsia/diarrhea     OB History    Gravida  1   Para      Term      Preterm      AB      Living        SAB      TAB      Ectopic      Multiple      Live Births               Home Medications    Prior to Admission medications   Medication Sig Start Date End Date Taking? Authorizing Provider  glucagon 1 MG injection Follow package directions for low blood sugar. Patient taking differently: as needed. Follow package directions for low blood sugar. 01/05/12  Yes Sherrlyn Hock, MD  ibuprofen (ADVIL,MOTRIN) 200 MG tablet Take 600 mg by mouth daily as needed.   Yes [provider]  Blood Glucose Monitoring Suppl (ACCU-CHEK AVIVA) device Use as instructed 06/03/15   Cassandria Anger, MD  insulin aspart (NOVOLOG FLEXPEN) 100 UNIT/ML FlexPen Inject 8-14 Units into the skin 3 (three) times daily with meals. Patient not taking: Reported on 05/27/2018 11/04/15   Cassandria Anger, MD  insulin glargine (LANTUS) 100 UNIT/ML injection Inject 0.16 mLs (16 Units total) into the skin at bedtime. Patient not taking: Reported on 05/27/2018 11/04/15   Cassandria Anger, MD    Family History Family History  Problem Relation Age of Onset  . Diabetes Mother        Type II DM  . Thyroid disease Mother   . Diabetes Maternal Grandmother        Type II DM  . Diabetes Cousin        Type II DM  . Colon cancer Neg Hx   .  Colon polyps Neg Hx     Social History Social History   Tobacco Use  . Smoking status: Never Smoker  . Smokeless tobacco: Never Used  Substance Use Topics  . Alcohol use: No  . Drug use: No     Allergies   Patient has no known allergies.   Review of Systems Review of Systems  All other systems reviewed and are negative.    Physical Exam Updated Vital Signs BP (!) 127/101 (BP Location: Right Arm)   Pulse (!) 117   Temp 97.8 F (36.6 C) (Oral)   Resp 18   Ht 1.753 m (5\' 9" )   Wt 57.6 kg   LMP 05/10/2018 (Approximate)   SpO2 98%   BMI 18.75 kg/m   Physical Exam Vitals signs and nursing note reviewed.  Constitutional:      General: She is not in acute distress.    Appearance: She is well-developed.  HENT:     Head: Normocephalic and atraumatic.     Mouth/Throat:     Pharynx: No oropharyngeal exudate.  Eyes:     General: No scleral icterus.       Right eye: No discharge.        Left eye: No discharge.     Conjunctiva/sclera: Conjunctivae normal.     Pupils: Pupils are equal, round, and reactive to light.  Neck:     Musculoskeletal: Normal range of motion and neck supple.     Thyroid: No thyromegaly.     Vascular: No JVD.  Cardiovascular:     Rate and Rhythm: Regular rhythm. Tachycardia present.     Heart sounds: Normal heart sounds. No murmur. No friction rub. No gallop.   Pulmonary:     Effort: Pulmonary effort is normal. No respiratory distress.     Breath sounds: Normal breath sounds. No wheezing or rales.  Abdominal:     General: Bowel sounds are normal. There is no distension.     Palpations: Abdomen is soft. There is no mass.     Tenderness: There is no abdominal tenderness.  Musculoskeletal: Normal range of motion.        General: No tenderness.     Right lower leg: Edema present.     Left lower leg: Edema present.     Comments: 1+ pitting edema to the bilateral lower extremities from the proximal tibial through the feet  Lymphadenopathy:      Cervical: No cervical adenopathy.  Skin:    General: Skin  is warm and dry.     Findings: Erythema and rash present.     Comments: Large bulla located over the right medial first MTP, there is erythema over the foot on the dorsum, mild warmth, no open wounds or foreign bodies  Neurological:     Mental Status: She is alert.     Coordination: Coordination normal.  Psychiatric:        Behavior: Behavior normal.      ED Treatments / Results  Labs (all labs ordered are listed, but only abnormal results are displayed) Labs Reviewed - No data to display  EKG None  Radiology No results found.  Procedures Procedures (including critical care time)  Medications Ordered in ED Medications  sodium chloride 0.9 % bolus 1,000 mL (has no administration in time range)  vancomycin (VANCOCIN) IVPB 1000 mg/200 mL premix (has no administration in time range)     Initial Impression / Assessment and Plan / ED Course  I have reviewed the triage vital signs and the nursing notes.  Pertinent labs & imaging results that were available during my care of the patient were reviewed by me and considered in my medical decision making (see chart for details).  Clinical Course as of May 27 2118  Sat May 27, 2018  2016 CBC normal, BMP shows hyperglycemia but no signs of acidosis with CO2 of 28 and gap of 13.  Fluids and insulin given.  Xray without any signs of infection in the tissues / bones - TSH tests pending and will need to be followed up by PCP   [BM]  2115 I have reexamined the patient several times, she continues to have normal mental status, her pulses remained around 115 on my exam, her blood pressure is slightly elevated and she has no fever.  She has been given IV fluids and vancomycin, she states that she feels better, she has no itching, no significant pain to the foot, she is aware of her thyroid abnormalities and states that though she is not on any medications she is working with her doctor  to figure this out.  She reports that she sees Dr. Wolfgang Phoenix.  I will send a message to Dr. Wolfgang Phoenix to let them know what is going on this evening and to follow-up closely.  The patient is in total agreement, she will be discharged home on oral antibiotics, she is in agreement with the plan.   [BM]    Clinical Course User Index [BM] Noemi Chapel, MD   The patient's mild tachycardia raises suspicion for a possible infection induced diabetic crisis, this could also be related to the thyroid and hypothyroidism causing some peripheral edema.  We will check a TSH, thyroid testing, blood counts, metabolic panel and start antibiotics.  X-ray to rule out foreign body or osteomyelitis  Final Clinical Impressions(s) / ED Diagnoses   Final diagnoses:  Cellulitis of right foot  Hyperglycemia  Hypertension, unspecified type  Hyperthyroidism    ED Discharge Orders         Ordered    sulfamethoxazole-trimethoprim (BACTRIM DS,SEPTRA DS) 800-160 MG tablet  2 times daily     05/27/18 2117           Noemi Chapel, MD 05/27/18 2120

## 2018-05-29 LAB — T4: T4, Total: 7.9 ug/dL (ref 4.5–12.0)

## 2018-06-05 ENCOUNTER — Encounter: Payer: Self-pay | Admitting: Family Medicine

## 2018-06-05 ENCOUNTER — Ambulatory Visit (INDEPENDENT_AMBULATORY_CARE_PROVIDER_SITE_OTHER): Payer: PRIVATE HEALTH INSURANCE | Admitting: Family Medicine

## 2018-06-05 VITALS — BP 138/86 | Ht 69.0 in | Wt 125.2 lb

## 2018-06-05 DIAGNOSIS — E108 Type 1 diabetes mellitus with unspecified complications: Secondary | ICD-10-CM

## 2018-06-05 DIAGNOSIS — L97511 Non-pressure chronic ulcer of other part of right foot limited to breakdown of skin: Secondary | ICD-10-CM | POA: Diagnosis not present

## 2018-06-05 DIAGNOSIS — E08621 Diabetes mellitus due to underlying condition with foot ulcer: Secondary | ICD-10-CM | POA: Diagnosis not present

## 2018-06-05 MED ORDER — DOXYCYCLINE HYCLATE 100 MG PO TABS: 100.0000 mg | ORAL_TABLET | Freq: Two times a day (BID) | ORAL | 0 refills | Status: DC

## 2018-06-05 NOTE — Progress Notes (Signed)
   Subjective:    Patient ID: Lindsey Murillo, female    DOB: [birth year], 23 y.o.   MRN: [id number]  HPI  Patient arrives for a follow up on a recent ER visit for cellulitis in her right foot. Patient states the blister finally popped  Patient states the ER doctor stated she needs to get back on her thyroid med and blood pressure med. patient states she would also like a refill of insulin- she is trying to do better and take it lately.  workign for the past six months  Lives with mom and brother  Please see recent emergency room report.  On further history patient is somewhat evasive but it sounds like she often is skipping her insulin.  States that she could not afford it.  She now has different insurance and is hopeful that she will be able to afford.  History of thyroiditis in the past.  Recent TSH slightly low.  ER doctor instructed her to discuss with [place] further  Patient has had multiple endocrinologist in the past.  Dr. Tobe Sos as a child.  Dr. Dorris Fetch at one point.  Now finds her self pointed out health department [facility] for her insulin-dependent diabetes management    Review of Systems No headache, no major weight loss or weight gain, no chest pain no back pain abdominal pain no change in bowel habits complete ROS otherwise negative     Objective:   Physical Exam  Alert and oriented, vitals reviewed and stable, NAD ENT-TM's and ext canals WNL bilat via otoscopic exam Soft palate, tonsils and post pharynx WNL via oropharyngeal exam Neck-symmetric, no masses; thyroid nonpalpable and nontender Pulmonary-no tachypnea or accessory muscle use; Clear without wheezes via auscultation Card--no abnrml murmurs, rhythm reg and rate WNL Carotid pulses symmetric, without bruits Right foot excellent pulse minimal tenderness/sensation diminished somewhat Right foot inflamed blister/ulcer.  Patient's excess skin was trimmed off.  Proper wound care discussed.       Assessment & Plan:  Impression 1 diabetic foot ulcer patient has no regular podiatrist.  Compliance suboptimal.  Will give 10 further days of antibiotics.  Will give referral to general surgeon for wound care  2.  Type 1 diabetes with poor compliance.  Long discussion held.  Patient strongly encouraged to get back on insulin.  Claims she had no low sugar spells on current dose and will resume.  Also would like to see an endocrinologist again  3.  Borderline TSH not enough to initiate medicines discussed  As per orders/warning signs discussed

## 2018-06-07 ENCOUNTER — Telehealth: Payer: Self-pay | Admitting: Family Medicine

## 2018-06-07 DIAGNOSIS — E08621 Diabetes mellitus due to underlying condition with foot ulcer: Secondary | ICD-10-CM

## 2018-06-07 DIAGNOSIS — L97511 Non-pressure chronic ulcer of other part of right foot limited to breakdown of skin: Principal | ICD-10-CM

## 2018-06-07 NOTE — Telephone Encounter (Signed)
Tried to contact patient; left message to return call.

## 2018-06-07 NOTE — Telephone Encounter (Signed)
Correction pt is aware to be here 06/09/2018 at 9:30 am.

## 2018-06-07 NOTE — Telephone Encounter (Signed)
Pt needs to schedule an appointment on Friday morning with Dr.Steve to have ulcer rechecked.

## 2018-06-07 NOTE — Telephone Encounter (Signed)
Ok lets change to wound care in [place], still let me see her Lindsey Murillo, let dr bridges' office know

## 2018-06-07 NOTE — Telephone Encounter (Signed)
Patient is aware to be here on 06/08/2018 at 9:30 am.

## 2018-06-07 NOTE — Telephone Encounter (Signed)
Patient is aware she has an appt with [place] on Friday at 9:30 am and I called and spoke with Claiborne Billings at Dr. Constance Haw office and they are aware to cancel the appointment with them on the 6th. Dr. Richardson Landry wants pt scheduled at the wound care center instead. [phone].

## 2018-06-07 NOTE — Telephone Encounter (Signed)
Please advise 

## 2018-06-07 NOTE — Telephone Encounter (Signed)
Dr. Constance Haw had her receptionist call to say that they can see the patient 06/15/2018 but before they schedule they wanted to know if Dr. Richardson Landry was looking for pt to get debridement?    They do not do that in the office & would refer patient to Wound Care in [facility] or to Podiatry  Please advise   Dr. Constance Haw # [phone], option 1

## 2018-06-08 NOTE — Telephone Encounter (Signed)
Please initiate referral in system to wound care so that I may process

## 2018-06-08 NOTE — Telephone Encounter (Signed)
Referral ordered in Epic. 

## 2018-06-09 ENCOUNTER — Encounter: Payer: Self-pay | Admitting: Family Medicine

## 2018-06-09 ENCOUNTER — Ambulatory Visit (INDEPENDENT_AMBULATORY_CARE_PROVIDER_SITE_OTHER): Payer: PRIVATE HEALTH INSURANCE | Admitting: Family Medicine

## 2018-06-09 VITALS — BP 134/82 | Wt 123.2 lb

## 2018-06-09 DIAGNOSIS — L97511 Non-pressure chronic ulcer of other part of right foot limited to breakdown of skin: Secondary | ICD-10-CM

## 2018-06-09 DIAGNOSIS — E108 Type 1 diabetes mellitus with unspecified complications: Secondary | ICD-10-CM | POA: Diagnosis not present

## 2018-06-09 DIAGNOSIS — E08621 Diabetes mellitus due to underlying condition with foot ulcer: Secondary | ICD-10-CM | POA: Diagnosis not present

## 2018-06-09 LAB — POCT GLYCOSYLATED HEMOGLOBIN (HGB A1C): HbA1c POC (<> result, manual entry): >13 % (ref 4.0–5.6)

## 2018-06-09 LAB — POCT GLUCOSE (DEVICE FOR HOME USE): POC GLUCOSE: 397 mg/dL — AB (ref 70–99)

## 2018-06-09 MED ORDER — BASAGLAR KWIKPEN 100 UNIT/ML ~~LOC~~ SOPN: 30.0000 [IU] | PEN_INJECTOR | Freq: Every day | SUBCUTANEOUS | 2 refills | Status: DC

## 2018-06-09 NOTE — Patient Instructions (Signed)
PLEASE you must follow up with dr nida for your diabetes  If in three days after starting the night time basaglar 30 units each night your morning numbers are above 200, add 10 units to equal 40 each night  If 7 days after that your morning fasting numbers are sill above 200, add 10 more units of insulin to equal 50  Stay at home mostly til you see the wound care doctor

## 2018-06-09 NOTE — Progress Notes (Signed)
Subjective:    Patient ID: Lindsey Murillo, female    DOB: [birth year], 23 y.o.   MRN: [id number]  HPI Patient arrives for a follow up on diabetic ulcer on right foot. Patient is taking Lantus 15 units BID that she received from a friend. Patient has not been able to buy insulin and is not taking novalog sliding scale recently Also as of January her insurance will not pay for Lantus and will pay for [place]   Results for orders placed or performed in visit on 06/09/18  POCT Glucose (Device for Home Use)  Result Value Ref Range   Glucose Fasting, POC     POC Glucose 397 (A) 70 - 99 mg/dl  POCT glycosylated hemoglobin (Hb A1C)  Result Value Ref Range   Hemoglobin A1C     HbA1c POC (<> result, manual entry) >13 4.0 - 5.6 %   HbA1c, POC (prediabetic range)     HbA1c, POC (controlled diabetic range)     Patient arrives for follow-up for her diabetes and her foot.  Long discussion held.  Patient admits to often going a substantial period of time without taking hardly any insulin at all.  Currently taking a friend's insulin.  She claims she is taking it 15 units twice per day but even some days she is not taking that.  No longer seeing casual [place] which was providing her endocrinology care.  Endocrinology visit pending.  Patient is in attendance with her mother today.  Both of them admit her diet has been horrible  Patient notes less pain in the foot.  Still experiencing swelling.  Ongoing antibiotics patient claims maintenance   Review of Systems No headache, no major weight loss or weight gain, no chest pain no back pain abdominal pain no change in bowel habits complete ROS otherwise negative     Objective:   Physical Exam   Alert and oriented, vitals reviewed and stable, NAD ENT-TM's and ext canals WNL bilat via otoscopic exam Soft palate, tonsils and post pharynx WNL via oropharyngeal exam Neck-symmetric, no masses; thyroid nonpalpable and  nontender Pulmonary-no tachypnea or accessory muscle use; Clear without wheezes via auscultation Card--no abnrml murmurs, rhythm reg and rate WNL Carotid pulses symmetric, without bruits Right foot ulceration persists substantial.  Surrounding tissue less tender.  Arterial pulses good.  Sensation intact to light touch.     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  I think it would be a victory if we could just get her to take her long-acting insulin day by day.  Due to insurance purposes have to switch to [employer] rather than 15 twice daily changed to 30 daily.  I basically begged her to do this faithfully.  Will start monitoring sugars each morning.  After 3 days may increase to 40 daily if morning sugars are still over 200.  After under 7 days may increase to 50 if necessary.  2.  Diabetic foot ulcer.  Advised patient to hold off on work now.  Keep the leg elevated.  Maintain antibiotics.  Warning signs discussed.  Await follow-up with both endocrinologist and wound care specialist.  Very long discussion held with patient and mother about high risk of premature death and amputation and other terrible veins if she were to continue on this path.  I sincerely hope that she is listening to what I have the same in this regard.  Greater than 50% of this 40 minute face  to face visit was spent in counseling and discussion and coordination of care regarding the above diagnosis/diagnosies

## 2018-06-11 ENCOUNTER — Encounter: Payer: Self-pay | Admitting: Family Medicine

## 2018-06-16 ENCOUNTER — Other Ambulatory Visit (HOSPITAL_COMMUNITY)
Admission: RE | Admit: 2018-06-16 | Discharge: 2018-06-16 | Disposition: A | Discharge status: Home / Self Care | Payer: PRIVATE HEALTH INSURANCE | Attending: Internal Medicine | Admitting: Internal Medicine

## 2018-06-16 ENCOUNTER — Other Ambulatory Visit (HOSPITAL_COMMUNITY): Payer: Self-pay | Admitting: Internal Medicine

## 2018-06-16 ENCOUNTER — Ambulatory Visit (HOSPITAL_COMMUNITY)
Admission: RE | Admit: 2018-06-16 | Discharge: 2018-06-16 | Disposition: A | Discharge status: Home / Self Care | Payer: PRIVATE HEALTH INSURANCE | Source: Ambulatory Visit | Attending: Internal Medicine | Admitting: Internal Medicine

## 2018-06-16 ENCOUNTER — Encounter (HOSPITAL_BASED_OUTPATIENT_CLINIC_OR_DEPARTMENT_OTHER): Payer: PRIVATE HEALTH INSURANCE | Attending: Internal Medicine

## 2018-06-16 DIAGNOSIS — E10621 Type 1 diabetes mellitus with foot ulcer: Secondary | ICD-10-CM | POA: Insufficient documentation

## 2018-06-16 DIAGNOSIS — M79671 Pain in right foot: Secondary | ICD-10-CM

## 2018-06-16 DIAGNOSIS — L03115 Cellulitis of right lower limb: Secondary | ICD-10-CM | POA: Diagnosis not present

## 2018-06-16 DIAGNOSIS — E1042 Type 1 diabetes mellitus with diabetic polyneuropathy: Secondary | ICD-10-CM | POA: Diagnosis not present

## 2018-06-16 DIAGNOSIS — L97512 Non-pressure chronic ulcer of other part of right foot with fat layer exposed: Secondary | ICD-10-CM | POA: Insufficient documentation

## 2018-06-16 DIAGNOSIS — Z794 Long term (current) use of insulin: Secondary | ICD-10-CM | POA: Diagnosis not present

## 2018-06-19 LAB — AEROBIC CULTURE  (SUPERFICIAL SPECIMEN): GRAM STAIN: NONE SEEN

## 2018-06-19 LAB — AEROBIC CULTURE W GRAM STAIN (SUPERFICIAL SPECIMEN)

## 2018-06-20 DIAGNOSIS — E10621 Type 1 diabetes mellitus with foot ulcer: Secondary | ICD-10-CM | POA: Diagnosis not present

## 2018-06-23 DIAGNOSIS — E10621 Type 1 diabetes mellitus with foot ulcer: Secondary | ICD-10-CM | POA: Diagnosis not present

## 2018-06-30 DIAGNOSIS — E10621 Type 1 diabetes mellitus with foot ulcer: Secondary | ICD-10-CM | POA: Diagnosis not present

## 2018-07-07 DIAGNOSIS — E10621 Type 1 diabetes mellitus with foot ulcer: Secondary | ICD-10-CM | POA: Diagnosis not present

## 2018-07-14 ENCOUNTER — Encounter (HOSPITAL_BASED_OUTPATIENT_CLINIC_OR_DEPARTMENT_OTHER): Payer: PRIVATE HEALTH INSURANCE | Attending: Internal Medicine

## 2018-07-14 DIAGNOSIS — E1042 Type 1 diabetes mellitus with diabetic polyneuropathy: Secondary | ICD-10-CM | POA: Insufficient documentation

## 2018-07-14 DIAGNOSIS — L97512 Non-pressure chronic ulcer of other part of right foot with fat layer exposed: Secondary | ICD-10-CM | POA: Diagnosis not present

## 2018-07-14 DIAGNOSIS — E10621 Type 1 diabetes mellitus with foot ulcer: Secondary | ICD-10-CM | POA: Diagnosis present

## 2018-07-21 ENCOUNTER — Other Ambulatory Visit (HOSPITAL_COMMUNITY)
Admission: RE | Admit: 2018-07-21 | Discharge: 2018-07-21 | Disposition: A | Discharge status: Home / Self Care | Payer: PRIVATE HEALTH INSURANCE | Source: Other Acute Inpatient Hospital | Attending: Internal Medicine | Admitting: Internal Medicine

## 2018-07-21 DIAGNOSIS — L97513 Non-pressure chronic ulcer of other part of right foot with necrosis of muscle: Secondary | ICD-10-CM | POA: Insufficient documentation

## 2018-07-21 DIAGNOSIS — E10621 Type 1 diabetes mellitus with foot ulcer: Secondary | ICD-10-CM | POA: Insufficient documentation

## 2018-07-25 ENCOUNTER — Ambulatory Visit: Payer: PRIVATE HEALTH INSURANCE | Admitting: "Endocrinology

## 2018-07-28 DIAGNOSIS — E10621 Type 1 diabetes mellitus with foot ulcer: Secondary | ICD-10-CM | POA: Diagnosis not present

## 2018-07-28 LAB — AEROBIC CULTURE  (SUPERFICIAL SPECIMEN)

## 2018-07-28 LAB — AEROBIC CULTURE W GRAM STAIN (SUPERFICIAL SPECIMEN)

## 2018-08-04 DIAGNOSIS — E10621 Type 1 diabetes mellitus with foot ulcer: Secondary | ICD-10-CM | POA: Diagnosis not present

## 2018-08-11 ENCOUNTER — Encounter (HOSPITAL_BASED_OUTPATIENT_CLINIC_OR_DEPARTMENT_OTHER): Payer: PRIVATE HEALTH INSURANCE | Attending: Internal Medicine

## 2018-08-11 ENCOUNTER — Other Ambulatory Visit: Payer: Self-pay

## 2018-08-11 DIAGNOSIS — L97821 Non-pressure chronic ulcer of other part of left lower leg limited to breakdown of skin: Secondary | ICD-10-CM | POA: Diagnosis not present

## 2018-08-11 DIAGNOSIS — E1042 Type 1 diabetes mellitus with diabetic polyneuropathy: Secondary | ICD-10-CM | POA: Insufficient documentation

## 2018-08-11 DIAGNOSIS — E10621 Type 1 diabetes mellitus with foot ulcer: Secondary | ICD-10-CM | POA: Insufficient documentation

## 2018-08-11 DIAGNOSIS — E10622 Type 1 diabetes mellitus with other skin ulcer: Secondary | ICD-10-CM | POA: Diagnosis not present

## 2018-08-11 DIAGNOSIS — L97512 Non-pressure chronic ulcer of other part of right foot with fat layer exposed: Secondary | ICD-10-CM | POA: Insufficient documentation

## 2018-08-17 DIAGNOSIS — E10621 Type 1 diabetes mellitus with foot ulcer: Secondary | ICD-10-CM | POA: Diagnosis not present

## 2018-08-25 DIAGNOSIS — E10621 Type 1 diabetes mellitus with foot ulcer: Secondary | ICD-10-CM | POA: Diagnosis not present

## 2018-08-28 DIAGNOSIS — E10621 Type 1 diabetes mellitus with foot ulcer: Secondary | ICD-10-CM | POA: Diagnosis not present

## 2018-09-01 DIAGNOSIS — E10621 Type 1 diabetes mellitus with foot ulcer: Secondary | ICD-10-CM | POA: Diagnosis not present

## 2018-09-04 ENCOUNTER — Ambulatory Visit: Payer: PRIVATE HEALTH INSURANCE | Admitting: Gastroenterology

## 2018-09-04 ENCOUNTER — Other Ambulatory Visit: Payer: Self-pay

## 2018-09-04 ENCOUNTER — Telehealth: Payer: Self-pay | Admitting: *Deleted

## 2018-09-04 NOTE — Telephone Encounter (Signed)
Called patient at 11:30am for virtual visit with LSL. Lmovm. Patient did not return phone call. Please No Show. Thanks

## 2018-09-05 ENCOUNTER — Encounter: Payer: Self-pay | Admitting: Gastroenterology

## 2018-09-05 ENCOUNTER — Telehealth: Payer: Self-pay | Admitting: Gastroenterology

## 2018-09-05 NOTE — Telephone Encounter (Signed)
PATIENT WAS A NO SHOW AND LETTER SENT  °

## 2018-09-05 NOTE — Telephone Encounter (Signed)
DONE

## 2018-09-08 ENCOUNTER — Encounter (HOSPITAL_BASED_OUTPATIENT_CLINIC_OR_DEPARTMENT_OTHER): Payer: PRIVATE HEALTH INSURANCE | Attending: Internal Medicine

## 2018-09-08 ENCOUNTER — Other Ambulatory Visit: Payer: Self-pay

## 2018-09-08 DIAGNOSIS — E1042 Type 1 diabetes mellitus with diabetic polyneuropathy: Secondary | ICD-10-CM | POA: Diagnosis not present

## 2018-09-08 DIAGNOSIS — Z8631 Personal history of diabetic foot ulcer: Secondary | ICD-10-CM | POA: Insufficient documentation

## 2018-09-08 DIAGNOSIS — Z09 Encounter for follow-up examination after completed treatment for conditions other than malignant neoplasm: Secondary | ICD-10-CM | POA: Insufficient documentation

## 2018-09-08 DIAGNOSIS — Z872 Personal history of diseases of the skin and subcutaneous tissue: Secondary | ICD-10-CM | POA: Insufficient documentation

## 2018-12-13 LAB — BASIC METABOLIC PANEL WITH GFR
BUN: 18 (ref 4–21)
Creatinine: 0.8 (ref 0.5–1.1)

## 2018-12-13 LAB — VITAMIN D 25 HYDROXY (VIT D DEFICIENCY, FRACTURES): Vit D, 25-Hydroxy: 4

## 2018-12-13 LAB — HEMOGLOBIN A1C: Hemoglobin A1C: >15.5

## 2019-02-27 ENCOUNTER — Encounter: Payer: Self-pay | Admitting: Family Medicine

## 2019-02-27 ENCOUNTER — Other Ambulatory Visit: Payer: Self-pay

## 2019-02-27 ENCOUNTER — Ambulatory Visit (INDEPENDENT_AMBULATORY_CARE_PROVIDER_SITE_OTHER): Payer: Self-pay | Admitting: Family Medicine

## 2019-02-27 VITALS — Temp 98.4°F | Ht 69.0 in | Wt 127.2 lb

## 2019-02-27 DIAGNOSIS — E08621 Diabetes mellitus due to underlying condition with foot ulcer: Secondary | ICD-10-CM

## 2019-02-27 DIAGNOSIS — E108 Type 1 diabetes mellitus with unspecified complications: Secondary | ICD-10-CM

## 2019-02-27 DIAGNOSIS — L97511 Non-pressure chronic ulcer of other part of right foot limited to breakdown of skin: Secondary | ICD-10-CM

## 2019-02-27 MED ORDER — AMOXICILLIN-POT CLAVULANATE 875-125 MG PO TABS: 1.0000 | ORAL_TABLET | Freq: Two times a day (BID) | ORAL | 0 refills | Status: DC

## 2019-02-27 MED ORDER — CIPROFLOXACIN HCL 500 MG PO TABS: 500.0000 mg | ORAL_TABLET | Freq: Two times a day (BID) | ORAL | 0 refills | Status: DC

## 2019-02-27 NOTE — Progress Notes (Signed)
   Subjective:    Patient ID: Lindsey Murillo, female    DOB: 10/13/1995, 23 y.o.   MRN: [id number]  HPI Patient arrives with right foot and ankle swelling for 1-1.5 weeks.  Very unfortunately patient did not follow-up with Dr. Dorris Fetch as she promised she would back in January  Patient notes leg and ankle swelling to the nurse.  She does not mention she has had a recurrence of the ulceration.  No fever no chills.  Some pain in her foot.  Patient has been placing a dressing no obvious discharge   Review of Systems No headache, no major weight loss or weight gain, no chest pain no back pain abdominal pain no change in bowel habits complete ROS otherwise negative     Objective:   Physical Exam   Alert vitals stable, NAD. Blood pressure good on repeat. HEENT normal. Lungs clear. Heart regular rate and rhythm. Right foot swollen.  Diffusely red and.  Some tender to palpation positive open wounds evident on plantar surface.  Some denuding of skin.  Pulses good.  Ankles 2+ edema.     Assessment & Plan:  Impression diabetic foot ulcer with poor compliance history.  Concerning.  Once again basically begged the patient to see her diabetes doctor.  Foot x-ray today.  Antibiotics prescribed.  Referral back to the wound center.  Of course they cannot see for several weeks so will follow up here next week.  Warning signs discussed carefully.

## 2019-02-28 ENCOUNTER — Other Ambulatory Visit: Payer: Self-pay

## 2019-02-28 ENCOUNTER — Ambulatory Visit (HOSPITAL_COMMUNITY)
Admission: RE | Admit: 2019-02-28 | Discharge: 2019-02-28 | Disposition: A | Discharge status: Home / Self Care | Payer: PRIVATE HEALTH INSURANCE | Source: Ambulatory Visit | Attending: Family Medicine | Admitting: Family Medicine

## 2019-02-28 DIAGNOSIS — L97511 Non-pressure chronic ulcer of other part of right foot limited to breakdown of skin: Secondary | ICD-10-CM | POA: Insufficient documentation

## 2019-02-28 DIAGNOSIS — E08621 Diabetes mellitus due to underlying condition with foot ulcer: Secondary | ICD-10-CM | POA: Diagnosis present

## 2019-03-04 ENCOUNTER — Encounter: Payer: Self-pay | Admitting: Family Medicine

## 2019-03-14 ENCOUNTER — Other Ambulatory Visit: Payer: Self-pay | Admitting: *Deleted

## 2019-03-14 DIAGNOSIS — Z20822 Contact with and (suspected) exposure to covid-19: Secondary | ICD-10-CM

## 2019-03-15 LAB — NOVEL CORONAVIRUS, NAA: SARS-CoV-2, NAA: NOT DETECTED

## 2019-03-20 ENCOUNTER — Other Ambulatory Visit: Payer: Self-pay | Admitting: Internal Medicine

## 2019-03-20 ENCOUNTER — Encounter (HOSPITAL_BASED_OUTPATIENT_CLINIC_OR_DEPARTMENT_OTHER): Payer: PRIVATE HEALTH INSURANCE | Attending: Internal Medicine | Admitting: Internal Medicine

## 2019-03-20 ENCOUNTER — Other Ambulatory Visit (HOSPITAL_COMMUNITY)
Admission: RE | Admit: 2019-03-20 | Discharge: 2019-03-20 | Disposition: A | Discharge status: Home / Self Care | Payer: PRIVATE HEALTH INSURANCE | Source: Other Acute Inpatient Hospital | Attending: Internal Medicine | Admitting: Internal Medicine

## 2019-03-20 ENCOUNTER — Other Ambulatory Visit (HOSPITAL_COMMUNITY): Payer: Self-pay | Admitting: Internal Medicine

## 2019-03-20 ENCOUNTER — Ambulatory Visit (INDEPENDENT_AMBULATORY_CARE_PROVIDER_SITE_OTHER): Payer: PRIVATE HEALTH INSURANCE | Admitting: "Endocrinology

## 2019-03-20 ENCOUNTER — Other Ambulatory Visit: Payer: Self-pay

## 2019-03-20 ENCOUNTER — Encounter: Payer: Self-pay | Admitting: "Endocrinology

## 2019-03-20 VITALS — BP 128/80 | HR 102 | Ht 69.0 in | Wt 129.0 lb

## 2019-03-20 DIAGNOSIS — E559 Vitamin D deficiency, unspecified: Secondary | ICD-10-CM | POA: Diagnosis not present

## 2019-03-20 DIAGNOSIS — E1051 Type 1 diabetes mellitus with diabetic peripheral angiopathy without gangrene: Secondary | ICD-10-CM | POA: Diagnosis not present

## 2019-03-20 DIAGNOSIS — E11621 Type 2 diabetes mellitus with foot ulcer: Secondary | ICD-10-CM | POA: Insufficient documentation

## 2019-03-20 DIAGNOSIS — E10621 Type 1 diabetes mellitus with foot ulcer: Secondary | ICD-10-CM

## 2019-03-20 DIAGNOSIS — L03115 Cellulitis of right lower limb: Secondary | ICD-10-CM | POA: Diagnosis not present

## 2019-03-20 DIAGNOSIS — L97512 Non-pressure chronic ulcer of other part of right foot with fat layer exposed: Secondary | ICD-10-CM | POA: Diagnosis not present

## 2019-03-20 DIAGNOSIS — B954 Other streptococcus as the cause of diseases classified elsewhere: Secondary | ICD-10-CM | POA: Diagnosis not present

## 2019-03-20 DIAGNOSIS — L97518 Non-pressure chronic ulcer of other part of right foot with other specified severity: Secondary | ICD-10-CM | POA: Diagnosis not present

## 2019-03-20 DIAGNOSIS — I890 Lymphedema, not elsewhere classified: Secondary | ICD-10-CM | POA: Diagnosis not present

## 2019-03-20 DIAGNOSIS — E1042 Type 1 diabetes mellitus with diabetic polyneuropathy: Secondary | ICD-10-CM | POA: Insufficient documentation

## 2019-03-20 DIAGNOSIS — L97811 Non-pressure chronic ulcer of other part of right lower leg limited to breakdown of skin: Secondary | ICD-10-CM | POA: Insufficient documentation

## 2019-03-20 MED ORDER — FREESTYLE LIBRE 14 DAY READER DEVI: 1.0000 | Freq: Once | 0 refills | Status: AC

## 2019-03-20 MED ORDER — BASAGLAR KWIKPEN 100 UNIT/ML ~~LOC~~ SOPN: 30.0000 [IU] | PEN_INJECTOR | Freq: Every day | SUBCUTANEOUS | 2 refills | Status: DC

## 2019-03-20 MED ORDER — FREESTYLE LIBRE 14 DAY SENSOR MISC: 1.0000 | 2 refills | Status: DC

## 2019-03-20 MED ORDER — VITAMIN D (ERGOCALCIFEROL) 1.25 MG (50000 UNIT) PO CAPS: 50000.0000 [IU] | ORAL_CAPSULE | ORAL | 1 refills | Status: DC

## 2019-03-20 MED ORDER — NOVOLOG FLEXPEN 100 UNIT/ML ~~LOC~~ SOPN: 8.0000 [IU] | PEN_INJECTOR | Freq: Three times a day (TID) | SUBCUTANEOUS | 2 refills | Status: DC

## 2019-03-20 NOTE — Progress Notes (Signed)
MCKENLY, BENCOMO ([id number]) Visit Report for 03/20/2019 Abuse/Suicide Risk Screen Details Patient Name: Date of Service: Lindsey Murillo, Lindsey Murillo 03/20/2019 10:30 AM Medical Record [id number] Patient Account Number: [id number] Date of Birth/Sex: Treating RN: 09/02/1995 (23 y.o. Elam Dutch Primary Care Samson Ralph: Baltazar Apo Other Clinician: Referring Kailia Starry: Treating Niomie Englert/Extender:Robson, Evlyn Kanner, [place] Dom in Treatment: 0 Abuse/Suicide Risk Screen Items Answer ABUSE RISK SCREEN: Has anyone close to you tried to hurt or harm you recentlyo No Do you feel uncomfortable with anyone in your familyo No Has anyone forced you do things that you didnt want to doo No Electronic Signature(s) Signed: 03/20/2019 2:49:59 PM By: Baruch Gouty RN, BSN Entered By: Baruch Gouty on 03/20/2019 10:51:57 -------------------------------------------------------------------------------- Activities of Daily Living Details Patient Name: Date of Service: Lindsey Murillo, Lindsey Murillo 03/20/2019 10:30 AM Medical Record [id number] Patient Account Number: [id number] Date of Birth/Sex: Treating RN: 31-Jul-1995 (23 y.o. Elam Dutch Primary Care Avyukth Bontempo: Baltazar Apo Other Clinician: Referring Mitchelle Goerner: Treating Jenah Vanasten/Extender:Robson, Evlyn Kanner, [place] Dom in Treatment: 0 Activities of Daily Living Items Answer Activities of Daily Living (Please select one for each item) Drive Automobile Completely Able Take Medications Completely Able Use Telephone Completely Able Care for Appearance Completely Able Use Toilet Completely Able Bath / Shower Completely Able Dress Self Completely Able Feed Self Completely Able Walk Completely Able Get In / Out Bed Completely Able Housework Completely Able Prepare Meals Completely Able Handle Money Completely Able Shop for Self Completely Able Electronic Signature(s) Signed: 03/20/2019 2:49:59 PM By:  Baruch Gouty RN, BSN Entered By: Baruch Gouty on 03/20/2019 10:52:26 -------------------------------------------------------------------------------- Education Screening Details Patient Name: Date of Service: Lindsey Hew. 03/20/2019 10:30 AM Medical Record [id number] Patient Account Number: [id number] Date of Birth/Sex: Treating RN: 01-Jul-1995 (23 y.o. Elam Dutch Primary Care Ivelisse Culverhouse: Baltazar Apo Other Clinician: Referring Ishan Sanroman: Treating Cola Highfill/Extender:Robson, Evlyn Kanner, [place] Dom in Treatment: 0 Primary Learner Assessed: Patient Learning Preferences/Education Level/Primary Language Learning Preference: Explanation, Demonstration, Printed Material Highest Education Level: College or Above Preferred Language: English Cognitive Barrier Language Barrier: No Translator Needed: No Memory Deficit: No Emotional Barrier: No Cultural/Religious Beliefs Affecting Medical Care: No Physical Barrier Impaired Vision: No Impaired Hearing: No Decreased Hand dexterity: No Knowledge/Comprehension Knowledge Level: High Comprehension Level: High Ability to understand written High instructions: Ability to understand verbal High instructions: Motivation Anxiety Level: Calm Cooperation: Cooperative Education Importance: Acknowledges Need Interest in Health Problems: Asks Questions Perception: Coherent Willingness to Engage in Self- High Management Activities: Readiness to Engage in Self- High Management Activities: Electronic Signature(s) Signed: 03/20/2019 2:49:59 PM By: Baruch Gouty RN, BSN Entered By: Baruch Gouty on 03/20/2019 10:52:58 -------------------------------------------------------------------------------- Fall Risk Assessment Details Patient Name: Date of Service: Lindsey Hew. 03/20/2019 10:30 AM Medical Record [id number] Patient Account Number: [id number] Date of Birth/Sex: Treating RN: [birth year]  (23 y.o. Elam Dutch Primary Care Jayle Solarz: Baltazar Apo Other Clinician: Referring Latika Kronick: Treating Jonluke Cobbins/Extender:Robson, Evlyn Kanner, [place] Dom in Treatment: 0 Fall Risk Assessment Items Have you had 2 or more falls in the last 12 monthso 0 No Have you had any fall that resulted in injury in the last 12 monthso 0 No FALLS RISK SCREEN History of falling - immediate or within 3 months 0 No Secondary diagnosis (Do you have 2 or more medical diagnoseso) 0 No Ambulatory aid None/bed rest/wheelchair/nurse 0 Yes Crutches/cane/walker 0 No Furniture 0 No Intravenous therapy Access/Saline/Heparin Lock 0 No Weak (short steps with or without shuffle, stooped but able to lift head 0 No while walking, may seek support from furniture) Impaired (  short steps with shuffle, may have difficulty arising from chair, 0 No head down, impaired balance) Mental Status Oriented to own ability 0 Yes Overestimates or forgets limitations 0 No Risk Level: Low Risk Score: 0 Electronic Signature(s) Signed: 03/20/2019 2:49:59 PM By: Baruch Gouty RN, BSN Entered By: Baruch Gouty on 03/20/2019 10:53:25 -------------------------------------------------------------------------------- Foot Assessment Details Patient Name: Date of Service: Lindsey Hew. 03/20/2019 10:30 AM Medical Record [id number] Patient Account Number: [id number] Date of Birth/Sex: Treating RN: 05-Dec-1995 (23 y.o. Elam Dutch Primary Care Trindon Dorton: Baltazar Apo Other Clinician: Referring Elasha Tess: Treating Orlan Aversa/Extender:Robson, Evlyn Kanner, [place] Dom in Treatment: 0 Foot Assessment Items Site Locations + = Sensation present, - = Sensation absent, C = Callus, U = Ulcer R = Redness, W = Warmth, M = Maceration, PU = Pre-ulcerative lesion F = Fissure, S = Swelling, D = Dryness Assessment Right: Left: Other Deformity: No No Prior Foot Ulcer: Yes No Prior Amputation: No  No Charcot Joint: No No Ambulatory Status: Ambulatory Without Help Gait: Steady Electronic Signature(s) Signed: 03/20/2019 2:49:59 PM By: Baruch Gouty RN, BSN Entered By: Baruch Gouty on 03/20/2019 10:56:26 -------------------------------------------------------------------------------- Nutrition Risk Screening Details Patient Name: Date of Service: Lindsey Murillo, Lindsey Murillo 03/20/2019 10:30 AM Medical Record [id number] Patient Account Number: [id number] Date of Birth/Sex: Treating RN: Nov 04, 1995 (23 y.o. Elam Dutch Primary Care Kenney Going: Baltazar Apo Other Clinician: Referring Jameis Newsham: Treating Keagon Glascoe/Extender:Robson, Evlyn Kanner, [place] Dom in Treatment: 0 Height (in): 68 Weight (lbs): 137 Body Mass Index (BMI): 20.8 Nutrition Risk Screening Items Score Screening NUTRITION RISK SCREEN: I have an illness or condition that made me change the kind and/or 0 No amount of food I eat I eat fewer than two meals per day 0 No I eat few fruits and vegetables, or milk products 2 Yes I have three or more drinks of beer, liquor or wine almost every day 0 No I have tooth or mouth problems that make it hard for me to eat 0 No I don't always have enough money to buy the food I need 0 No I eat alone most of the time 0 No I take three or more different prescribed or over-the-counter drugs a day 0 No 0 No Without wanting to, I have lost or gained 10 pounds in the last six months I am not always physically able to shop, cook and/or feed myself 0 No Nutrition Protocols Good Risk Protocol 0 No interventions needed Moderate Risk Protocol High Risk Proctocol Risk Level: Good Risk Score: 2 Electronic Signature(s) Signed: 03/20/2019 2:49:59 PM By: Baruch Gouty RN, BSN Entered By: Baruch Gouty on 03/20/2019 10:54:31

## 2019-03-20 NOTE — Progress Notes (Signed)
03/20/2019   Consult :chronically uncontrolled type 1 diabetes   Subjective:    Patient ID: Lindsey Murillo, female    DOB: 1996/04/12. Patient is being seen in consultation for management of chronically uncontrolled, complicated diabetes requested by  Mikey Kirschner, MD  Past Medical History:  Diagnosis Date  . Acanthosis nigricans, acquired   . Asthma   . Diabetic autonomic neuropathy ([place])   . Diabetic peripheral neuropathy ([place])   . Environmental allergies   . Goiter   . Hypoglycemia associated with diabetes ([place])   . Tachycardia   . Thyroiditis, autoimmune   . Type 1 diabetes mellitus in patient age 24-19 years with HbA1C goal below 7.5    Past Surgical History:  Procedure Laterality Date  . BIOPSY  08/27/2016   Procedure: BIOPSY;  Surgeon: Danie Binder, MD;  Location: [facility];  Service: Endoscopy;;  duodenum; gastric  . COLONOSCOPY    . ESOPHAGOGASTRODUODENOSCOPY N/A 08/27/2016   Dr. Oneida Alar: mild gastritis. Negative celiac. No obvious source for dyspepsia/diarrhea   Social History   Socioeconomic History  . Marital status: Single    Spouse name: Not on file  . Number of children: Not on file  . Years of education: Not on file  . Highest education level: Not on file  Occupational History  . Not on file  Social Needs  . Financial resource strain: Not on file  . Food insecurity    Worry: Not on file    Inability: Not on file  . Transportation needs    Medical: Not on file    Non-medical: Not on file  Tobacco Use  . Smoking status: Never Smoker  . Smokeless tobacco: Never Used  Substance and Sexual Activity  . Alcohol use: No  . Drug use: No  . Sexual activity: Never    Birth control/protection: Abstinence  Lifestyle  . Physical activity    Days per week: Not on file    Minutes per session: Not on file  . Stress: Not on file  Relationships  . Social Herbalist on phone: Not on file    Gets together: Not on file    Attends  religious service: Not on file    Active member of club or organization: Not on file    Attends meetings of clubs or organizations: Not on file    Relationship status: Not on file  Other Topics Concern  . Not on file  Social History Narrative  . Not on file   Outpatient Encounter Medications as of 03/20/2019  Medication Sig  . insulin aspart (NOVOLOG FLEXPEN) 100 UNIT/ML FlexPen Inject 8-11 Units into the skin 3 (three) times daily with meals.  . Insulin Glargine (BASAGLAR KWIKPEN) 100 UNIT/ML SOPN Inject 0.3 mLs (30 Units total) into the skin at bedtime.  . [DISCONTINUED] insulin aspart (NOVOLOG FLEXPEN) 100 UNIT/ML FlexPen Inject 8-14 Units into the skin 3 (three) times daily with meals.  . [DISCONTINUED] Insulin Glargine (BASAGLAR KWIKPEN) 100 UNIT/ML SOPN Inject 0.3 mLs (30 Units total) into the skin at bedtime. May titrate to 50 units as directed (Patient taking differently: Inject 35 Units into the skin at bedtime. May titrate to 50 units as directed)  . amoxicillin-clavulanate (AUGMENTIN) 875-125 MG tablet Take 1 tablet by mouth 2 (two) times daily.  . Blood Glucose Monitoring Suppl (ACCU-CHEK AVIVA) device Use as instructed  . ciprofloxacin (CIPRO) 500 MG tablet Take 1 tablet (500 mg total) by mouth 2 (two) times daily.  Marland Kitchen  Continuous Blood Gluc Receiver (FREESTYLE LIBRE 14 DAY READER) DEVI 1 each by Does not apply route once for 1 dose.  . Continuous Blood Gluc Sensor (FREESTYLE LIBRE 14 DAY SENSOR) MISC Inject 1 each into the skin every 14 (fourteen) days. Use as directed.  Marland Kitchen glucagon 1 MG injection Follow package directions for low blood sugar. (Patient taking differently: as needed. Follow package directions for low blood sugar.)  . ibuprofen (ADVIL,MOTRIN) 200 MG tablet Take 600 mg by mouth daily as needed.   No facility-administered encounter medications on file as of 03/20/2019.    ALLERGIES: No Known Allergies VACCINATION STATUS: Immunization History  Administered Date(s)  Administered  . Influenza,inj,Quad PF,6+ Mos 03/13/2013, 02/25/2014, 04/09/2016  . Td 04/15/2017    Diabetes She presents for her initial diabetic visit. She has type 1 diabetes mellitus. Onset time: She was diagnosed at age 23 years. Her disease course has been worsening (This patient was seen in 2017 with extremely high A1c of 13%, was given management plan.  She decided not to return for care unfortunately.  She is rereferred with even higher A1c of greater than 15.5%.). There are no hypoglycemic associated symptoms. Pertinent negatives for hypoglycemia include no confusion, headaches, pallor or seizures. Associated symptoms include blurred vision, fatigue, foot paresthesias, polydipsia and polyuria. Pertinent negatives for diabetes include no chest pain and no weight loss. There are no hypoglycemic complications. Symptoms are worsening. Diabetic complications include peripheral neuropathy and PVD. Risk factors for coronary artery disease include diabetes mellitus and sedentary lifestyle. Current diabetic treatment includes insulin injections. She is compliant with treatment none of the time. Her weight is increasing steadily. She is following a generally unhealthy diet. When asked about meal planning, she reported none. She has not had a previous visit with a dietitian. She never participates in exercise. (She did not bring any logs nor meter to review.  Her recent A1c was>15.5% in December 13, 2018. )     Review of Systems  Constitutional: Positive for fatigue. Negative for chills, fever, unexpected weight change and weight loss.  HENT: Negative for trouble swallowing and voice change.   Eyes: Positive for blurred vision. Negative for visual disturbance.  Respiratory: Negative for cough, shortness of breath and wheezing.   Cardiovascular: Negative for chest pain, palpitations and leg swelling.  Gastrointestinal: Negative for diarrhea, nausea and vomiting.  Endocrine: Positive for polydipsia and  polyuria. Negative for cold intolerance and heat intolerance.  Musculoskeletal: Negative for arthralgias and myalgias.  Skin: Negative for color change, pallor, rash and wound.  Neurological: Negative for seizures and headaches.  Psychiatric/Behavioral: Negative for confusion and suicidal ideas.    Objective:    BP 128/80   Pulse (!) 102   Ht 5\' 9"  (1.753 m)   Wt 129 lb (58.5 kg)   BMI 19.05 kg/m   Wt Readings from Last 3 Encounters:  03/20/19 129 lb (58.5 kg)  02/27/19 127 lb 3.2 oz (57.7 kg)  06/09/18 123 lb 3.2 oz (55.9 kg)    Physical Exam  Constitutional: She is oriented to person, place, and time.  HENT:  Head: Normocephalic and atraumatic.  Eyes: EOM are normal.  Neck: Normal range of motion. Neck supple. No tracheal deviation present. No thyromegaly present.  Cardiovascular: Normal rate and regular rhythm.  Pulmonary/Chest: Effort normal.  Abdominal: There is no abdominal tenderness. There is no guarding.  Musculoskeletal:        General: No edema.     Comments: Right lower extremity in a cast.  Neurological: She is alert and oriented to person, place, and time. She has normal reflexes. No cranial nerve deficit. Coordination normal.  Skin: Skin is warm and dry. No rash noted. No erythema. No pallor.  Psychiatric:  Patient remains reluctant, with unconcerned affect.      CMP     Component Value Date/Time   NA 134 (L) 05/27/2018 1936   K 3.5 05/27/2018 1936   CL 93 (L) 05/27/2018 1936   CO2 28 05/27/2018 1936   GLUCOSE 415 (H) 05/27/2018 1936   BUN 18 12/13/2018   CREATININE 0.8 12/13/2018   CREATININE 0.59 05/27/2018 1936   CREATININE 0.47 (L) 11/05/2015 1252   CALCIUM 9.5 05/27/2018 1936   PROT 7.8 04/12/2017 1014   ALBUMIN 3.8 04/12/2017 1014   AST 23 04/12/2017 1014   ALT 18 04/12/2017 1014   ALKPHOS 137 (H) 04/12/2017 1014   BILITOT 1.4 (H) 04/12/2017 1014   GFRNONAA >60 05/27/2018 1936   GFRAA >60 05/27/2018 1936     Diabetic Labs (most  recent): Lab Results  Component Value Date   HGBA1C >15.5 12/13/2018   HGBA1C >13 06/09/2018   HGBA1C 13.4 (H) 11/05/2015     Lipid Panel ( most recent) Lipid Panel     Component Value Date/Time   CHOL 147 11/05/2015 1252   TRIG 62 11/05/2015 1252   HDL 75 11/05/2015 1252   CHOLHDL 2.0 11/05/2015 1252   VLDL 12 11/05/2015 1252   LDLCALC 60 11/05/2015 1252       Assessment & Plan:   1. Type I diabetes mellitus with complication, uncontrolled ([place]) complicated by neuropathy, non-compliance.  - Patient has currently uncontrolled symptomatic type 1 DM since  23 years of age,  with most recent A1c of >15.5%. She was seen previously in this clinic in 2017, failed to follow-up. She returns with no logs nor meter.  She did not program arterial disease/peripheral neuropathy in the interim.   -Her care is complicated by noncompliance/nonadherence. -She is at extremely high risk for more complications of diabetes including retinopathy, currently disease, CKD, CVA, which are discussed in detail with her.  -she has chronic documented non-compliance to medical therapy and follow up. -There seemed some confusion about the type of her diabetes, but for management purposes she is classified as type 1 per Dr. Loren Racer notes. - I have counseled the patient on diet, she has to include more protein, starch / complex carbs to her diet.  - Patient is advised to stick to a routine mealtimes to eat 3 meals  a day and  With  snacks  If necessary to correct hypoglycemia).   - I have approached patient with the following individualized plan to manage diabetes and patient agrees:   -She will continue to require intensive treatment with basal/bolus insulin in order for her to achieve and maintain control of diabetes to target.  -She is advised to adjust her Basaglar to 30 units nightly, and her NovoLog to 8 units TIDAC for pre-meal BG readings of 90-150mg /dl, plus patient specific correction dose for  unexpected hyperglycemia above 150mg /dl, associated with strict monitoring of glucose  AC and HS. - Patient is warned not to take insulin without proper monitoring per orders. -Adjustment parameters are given for hypo and hyperglycemia in writing. -Patient is encouraged to call clinic for blood glucose levels less than 70 or above 300 mg /dl. -She will greatly benefit from CGM device.  I discussed and prescribed the freestyle libre device for her. -Patient is not  a candidate for  MTF, incretin therapy. Insulin is the exclusive option she has to stop catabolic state she is on.  - Patient specific target  A1c;  LDL, HDL, Triglycerides, and  Waist Circumference were discussed in detail.  2) BP/HTN: Her blood pressure is controlled to target.    3) Lipids/HPL:  Uncontrolled, will be considered for  statins. 4)  Weight/Diet: She has gained 20 pounds since her last visit here.   she is still malnourished  Due to severe insulin deficiency. CDE Consult is initiated , exercise, and detailed carbohydrates information provided.  5) vitamin D deficiency She is initiated on vitamin D2 50,000 units weekly for 24 weeks  6) Chronic Care/Health Maintenance:  -Patient is  encouraged to continue to follow up with Ophthalmology, Podiatrist at least yearly or according to recommendations, and advised to  stay away from smoking. I have recommended yearly flu vaccine and pneumonia vaccination at least every 5 years;  and  sleep for at least 7 hours a day.     - I advised patient to maintain close follow up with Mikey Kirschner, MD for primary care needs.  - Time spent with the patient: 60 min, of which >50% was spent in reviewing her blood glucose logs , discussing her hypoglycemia and hyperglycemia episodes, reviewing her current and  previous labs / studies and medications  doses and developing a plan to avoid hypoglycemia and hyperglycemia. Please refer to Patient Instructions for Blood Glucose Monitoring and  Insulin/Medications Dosing Guide"  in media tab for additional information. Please  also refer to " Patient Self Inventory" in the Media  tab for reviewed elements of pertinent patient history.  [employer] participated in the discussions, expressed understanding, and voiced agreement with the above plans.  All questions were answered to her satisfaction. she is encouraged to contact clinic should she have any questions or concerns prior to her return visit.   Follow up plan: - Return in about 10 days (around 03/30/2019), or office.  Glade Lloyd, MD Phone: [phone]  Fax: [phone]   03/20/2019, 4:37 PM

## 2019-03-21 LAB — AEROBIC CULTURE W GRAM STAIN (SUPERFICIAL SPECIMEN)

## 2019-03-26 ENCOUNTER — Ambulatory Visit (HOSPITAL_COMMUNITY): Payer: PRIVATE HEALTH INSURANCE

## 2019-03-29 ENCOUNTER — Other Ambulatory Visit: Payer: Self-pay

## 2019-03-29 ENCOUNTER — Encounter (HOSPITAL_BASED_OUTPATIENT_CLINIC_OR_DEPARTMENT_OTHER): Payer: PRIVATE HEALTH INSURANCE | Admitting: Internal Medicine

## 2019-03-29 DIAGNOSIS — E10621 Type 1 diabetes mellitus with foot ulcer: Secondary | ICD-10-CM | POA: Diagnosis not present

## 2019-03-29 NOTE — Progress Notes (Signed)
KAIDAN, SPENGLER ([id number]) Visit Report for 03/29/2019 HPI Details Patient Name: Date of Service: Lindsey Murillo, WELSHANS 03/29/2019 10:15 AM Medical Record [id number] Patient Account Number: [id number] Date of Birth/Sex: Treating RN: [birth year] (23 y.o. Debby Bud Primary Care Provider: Baltazar Apo Other Clinician: Referring Provider: Treating Provider/Extender:Dynasti Kerman, Evlyn Kanner, [place] Dom in Treatment: 1 History of Present Illness HPI Description: 06/16/2018 ADMISSION This is a 23 year old type I diabetic patient. She was referred by her family physician Dr. Wolfgang Phoenix in [place] where they live. She had been seen in the urgent care at [facility] on 05/27/2018 with a large bulla on the right dorsal foot. An x-ray of the foot showed swelling of the base of her first toe and first MTP with no soft tissue gas or bony erosion. She came to the primary physician's attention in follow-up on 06/09/2018 noted to have a diabetic foot ulcer. She was advised to take work off. She has been using peroxide and Neosporin to the wound. Past medical history; the patient does not have a prior history of diabetic foot ulcers. She is listed as having both peripheral neuropathy and autonomic neuropathy. Chronic diarrhea thoracic back pain and anemia. ABIs in our clinic were noncompressible on the right 2/11; follow-up for this badly infected wound that we admitted to the clinic on 2/7. The area in question is on the right first metatarsal head. When she arrived in clinic last week there was swelling and erythema in the foot as well as on the lateral part of the first metatarsal head. Plain x-rays showed soft tissue wound with soft tissue gas noted overlying the first MTP no radiographic changes of osteomyelitis. The culture showed abundant group B strep. The Augmentin that I chose empirically last time clearly the right choice. She is offloading this in a healing sandal. She is not  working. She states her diabetes is under reasonably good control 2/14; continued follow-up. The area in question which is on the right first metatarsal head looks improved although still requiring debridement were using silver alginate. She is completing 10 days of Augmentin which she has tolerated well. According to the patient she is offloading this quite aggressively. She is still in a surgical shoe 2/21; continued follow-up. Right first metatarsal head generally improved but still requiring debridement there is less swelling and no erythema. She is offloading this in a surgical shoe. 2/28; right first metatarsal head generally improved but still requiring debridement we have been using Hydrofera Blue she is using a surgical shoe but I changed her to a Darco forefoot off loader today. Possible she will ultimately require a total contact cast 3/6 right first metatarsal head generally improved there is less adherent debris and generally washes off nicely with Anasept and gauze. Using Hydrofera Blue and a Darco. We are going to consider putting her in a total contact cast today however we did not have sufficient supplies 3/13; our intake nurse reported serosanguineous drainage which was concerning. The patient denies any systemic symptoms we have been using Hydrofera Blue in today with anticipation of putting on a total contact cast. However we also noted swelling around the wound. 3/20 -Right first metatarsal head wound appears granulating, without areas of necrosis or debris surrounding skin appears good, she is completed or almost completed her Keflex course. We will continue with the alginate and she is doing the offloading with the surgical shoe and felt. 3/27; right first metatarsal head. There is no evidence of infection currently. She has senescent looking wound  edges and some debris on the wound surface but in general things look quite a bit better today. We have been using alginate and  offloading her with a forefoot off loader. I am not going to consider total contact casting her in this environment until I can be certain with that the clinic will be open to deal with the obligatory first cast change in the routine changing 4/3; right first metatarsal head. No evidence of infection. The tissue looks healthy although there is some tunneling laterally towards the mid part of the foot. We are using silver alginate, I have changed that the silver collagen today. 4/9 right first metatarsal head. No evidence of infection. Using silver collagen. 4/17 right first metatarsal head the wound is actually larger today and I think deeper. There is no evidence of recurrent infection. 4/20; back for her obligatory first total contact cast change. The wound on her foot actually looks quite good on the right first metatarsal head however she clearly has a cast injury at the superior margin of the cast just at the folded level this was in spite of padding this area. 4/24; wound on the right first metatarsal head looks better. The cast injury wounds on the anterior tibial area also look better. 5/1 wound on the right first metatarsal head is closed over. The cost injury wounds on the anterior tibial are also closed. The patient has type 1 diabetes with polyneuropathy. I do not think she has the financial means for diabetic shoes with custom inserts READMISSION 03/20/2019 This is a patient with type 1 diabetes and polyneuropathy. She was seen earlier this year with a wound on the right first metatarsal head the closed over. We recommended that she offload this in her shoes. I do not believe that was done but in any case her wounds reopened sometime in October. She was seen in the ER on 10/20 with a diabetic foot ulcer. Right foot was swollen and erythematous. She was put on antibiotics although at the time of this dictation I am not exactly sure which one. An x-ray was done that showed no osteo-.  She has been soaking her foot in Epson salts and applying dry gauze. She is still working at [employer]. She has 4 open areas; one at the base of the second through fourth toes. One at the first met head 1 over the second met head and 1 over the midfoot. The second met head wound probes proximally towards her midfoot and midfoot wound probes superiorly although I could not connect these 2 wounds. There is no purulent drainage. A considerable amount of denuded skin over this area which I think is a remanent of cellulitis although the medial part of her foot is still erythematous there is no tenderness. For the most part I think this looks like treated cellulitis from her antibiotics in October. She has a superficial area of epithelial loss on the right lower mid tibia Past medical history; patient is a type I diabetic with polyneuropathy. She is not felt to have PAD.Marland Kitchen She is unaware of her hemoglobin A1c she is supposed to see an endocrinologist this afternoon. 11/19; MRI is tomorrow. Culture I did last time showed group G strep. This would have been covered by the empiric Augmentin I gave her when I first saw her. She should have 3 more days of this I am going to extend this by another 4 days until we have time to digest the MRI. If she has osteomyelitis she is  going to need to see infectious disease for IV antibiotics. She has not been systemically unwell. She is tolerating the Augmentin well Electronic Signature(s) Signed: 03/29/2019 6:25:25 PM By: Linton Ham MD Entered By: Linton Ham on 03/29/2019 11:50:13 -------------------------------------------------------------------------------- Physical Exam Details Patient Name: Date of Service: AMANPREET, Lindsey Murillo 03/29/2019 10:15 AM Medical Record [id number] Patient Account Number: [id number] Date of Birth/Sex: Treating RN: 10-31-1995 (23 y.o. Debby Bud Primary Care Provider: Baltazar Apo Other Clinician: Referring  Provider: Treating Provider/Extender:Amiylah Anastos, Evlyn Kanner, [place] Dom in Treatment: 1 Constitutional Patient is hypertensive.. Pulse regular and within target range for patient.Marland Kitchen Respirations regular, non-labored and within target range.. Temperature is normal and within the target range for the patient.Marland Kitchen Appears in no distress. Eyes Conjunctivae clear. No discharge.no icterus. Respiratory work of breathing is normal. Cardiovascular Fetal pulses are easily palpable. Lymphatic None palpable in the popliteal area on the right. Musculoskeletal There is no actual joint tenderness in the foot however she has neuropathy.. Integumentary (Hair, Skin) Erythema in the forefoot. Psychiatric appears at normal baseline. Notes Wound exam; the foot still has forefoot swelling and there is erythema on the dorsal foot to just beyond the metatarsal heads by a centimeter or 2 there is no palpable tenderness. The center wound is a small wound that tunnels. The area on the lateral foot has eschar on it today and then there is an area on the metatarsal heads. None of this looks particularly different from last time. Electronic Signature(s) Signed: 03/29/2019 6:25:25 PM By: Linton Ham MD Entered By: Linton Ham on 03/29/2019 11:52:30 -------------------------------------------------------------------------------- Physician Orders Details Patient Name: Date of Service: ASIANA, BENNINGER 03/29/2019 10:15 AM Medical Record [id number] Patient Account Number: [id number] Date of Birth/Sex: Treating RN: Aug 01, 1995 (23 y.o. Helene Shoe, Meta.Reding Primary Care Provider: Baltazar Apo Other Clinician: Referring Provider: Treating Provider/Extender:Zamoria Boss, Evlyn Kanner, [place] Dom in Treatment: 1 Verbal / Phone Orders: No Diagnosis Coding ICD-10 Coding Code Description E10.621 Type 1 diabetes mellitus with foot ulcer L03.115 Cellulitis of right lower limb E10.42 Type 1 diabetes  mellitus with diabetic polyneuropathy L97.518 Non-pressure chronic ulcer of other part of right foot with other specified severity L97.512 Non-pressure chronic ulcer of other part of right foot with fat layer exposed L97.811 Non-pressure chronic ulcer of other part of right lower leg limited to breakdown of skin I89.0 Lymphedema, not elsewhere classified Follow-up Appointments Return Appointment in 1 week. - to see hoyt next week. Dressing Change Frequency Change dressing every day. Wound Cleansing Clean wound with Normal Saline. May shower and wash wound with soap and water. Primary Wound Dressing Wound #4 Right,Distal,Plantar Foot Calcium Alginate with Silver - pack into undermining areas Wound #5 Right Metatarsal head first Calcium Alginate with Silver - pack into undermining areas Wound #6 Right Metatarsal head second Calcium Alginate with Silver - pack into undermining areas Wound #7 Right,Proximal,Plantar Foot Calcium Alginate with Silver - pack into undermining areas Wound #8 Right,Anterior Lower Leg Calcium Alginate with Silver - pack into undermining areas Secondary Dressing Kerlix/Rolled Gauze - secure with tape Dry Gauze ABD pad Drawtex Edema Control Avoid standing for long periods of time Elevate legs to the level of the heart or above for 30 minutes daily and/or when sitting, a frequency of: - 3 to 4 times a day throughout the day. Off-Loading Open toe surgical shoe to: - on right foot. Patient Medications Allergies: No Known Allergies Notifications Medication Indication Start End Augmentin diabetic foot 03/29/2019 infection DOSE oral 875 mg-125 mg tablet - 1tablet oral bid for  an additional 3 days (total of 2 weeks rx) Electronic Signature(s) Signed: 03/29/2019 11:54:58 AM By: Linton Ham MD Entered By: Linton Ham on 03/29/2019 11:54:57 -------------------------------------------------------------------------------- Problem List Details Patient  Name: Date of Service: [name] 03/29/2019 10:15 AM Medical Record [id number] Patient Account Number: [id number] Date of Birth/Sex: Treating RN: 1996-03-24 (23 y.o. Helene Shoe, Meta.Reding Primary Care Provider: Baltazar Apo Other Clinician: Referring Provider: Treating Provider/Extender:Naasia Weilbacher, Evlyn Kanner, [place] Dom in Treatment: 1 Active Problems ICD-10 Evaluated Encounter Code Description Active Date Today Diagnosis E10.621 Type 1 diabetes mellitus with foot ulcer 03/20/2019 No Yes L03.115 Cellulitis of right lower limb 03/20/2019 No Yes E10.42 Type 1 diabetes mellitus with diabetic polyneuropathy 03/20/2019 No Yes L97.518 Non-pressure chronic ulcer of other part of right foot 03/20/2019 No Yes with other specified severity L97.512 Non-pressure chronic ulcer of other part of right foot 03/20/2019 No Yes with fat layer exposed L97.811 Non-pressure chronic ulcer of other part of right lower 03/20/2019 No Yes leg limited to breakdown of skin I89.0 Lymphedema, not elsewhere classified 03/20/2019 No Yes Inactive Problems Resolved Problems Electronic Signature(s) Signed: 03/29/2019 6:25:25 PM By: Linton Ham MD Entered By: Linton Ham on 03/29/2019 11:48:40 -------------------------------------------------------------------------------- Progress Note Details Patient Name: Date of Service: [name] 03/29/2019 10:15 AM Medical Record [id number] Patient Account Number: [id number] Date of Birth/Sex: Treating RN: 1996-02-19 (23 y.o. Debby Bud Primary Care Provider: Baltazar Apo Other Clinician: Referring Provider: Treating Provider/Extender:Danashia Landers, Evlyn Kanner, [place] Dom in Treatment: 1 Subjective History of Present Illness (HPI) 06/16/2018 ADMISSION This is a 23 year old type I diabetic patient. She was referred by her family physician Dr. Wolfgang Phoenix in [place] where they live. She had been seen in the urgent care  at [facility] on 05/27/2018 with a large bulla on the right dorsal foot. An x-ray of the foot showed swelling of the base of her first toe and first MTP with no soft tissue gas or bony erosion. She came to the primary physician's attention in follow-up on 06/09/2018 noted to have a diabetic foot ulcer. She was advised to take work off. She has been using peroxide and Neosporin to the wound. Past medical history; the patient does not have a prior history of diabetic foot ulcers. She is listed as having both peripheral neuropathy and autonomic neuropathy. Chronic diarrhea thoracic back pain and anemia. ABIs in our clinic were noncompressible on the right 2/11; follow-up for this badly infected wound that we admitted to the clinic on 2/7. The area in question is on the right first metatarsal head. When she arrived in clinic last week there was swelling and erythema in the foot as well as on the lateral part of the first metatarsal head. Plain x-rays showed soft tissue wound with soft tissue gas noted overlying the first MTP no radiographic changes of osteomyelitis. The culture showed abundant group B strep. The Augmentin that I chose empirically last time clearly the right choice. She is offloading this in a healing sandal. She is not working. She states her diabetes is under reasonably good control 2/14; continued follow-up. The area in question which is on the right first metatarsal head looks improved although still requiring debridement were using silver alginate. She is completing 10 days of Augmentin which she has tolerated well. According to the patient she is offloading this quite aggressively. She is still in a surgical shoe 2/21; continued follow-up. Right first metatarsal head generally improved but still requiring debridement there is less swelling and no erythema. She is offloading this in  a surgical shoe. 2/28; right first metatarsal head generally improved but still requiring debridement  we have been using Hydrofera Blue she is using a surgical shoe but I changed her to a Darco forefoot off loader today. Possible she will ultimately require a total contact cast 3/6 right first metatarsal head generally improved there is less adherent debris and generally washes off nicely with Anasept and gauze. Using Hydrofera Blue and a Darco. We are going to consider putting her in a total contact cast today however we did not have sufficient supplies 3/13; our intake nurse reported serosanguineous drainage which was concerning. The patient denies any systemic symptoms we have been using Hydrofera Blue in today with anticipation of putting on a total contact cast. However we also noted swelling around the wound. 3/20 -Right first metatarsal head wound appears granulating, without areas of necrosis or debris surrounding skin appears good, she is completed or almost completed her Keflex course. We will continue with the alginate and she is doing the offloading with the surgical shoe and felt. 3/27; right first metatarsal head. There is no evidence of infection currently. She has senescent looking wound edges and some debris on the wound surface but in general things look quite a bit better today. We have been using alginate and offloading her with a forefoot off loader. I am not going to consider total contact casting her in this environment until I can be certain with that the clinic will be open to deal with the obligatory first cast change in the routine changing 4/3; right first metatarsal head. No evidence of infection. The tissue looks healthy although there is some tunneling laterally towards the mid part of the foot. We are using silver alginate, I have changed that the silver collagen today. 4/9 right first metatarsal head. No evidence of infection. Using silver collagen. 4/17 right first metatarsal head the wound is actually larger today and I think deeper. There is no evidence  of recurrent infection. 4/20; back for her obligatory first total contact cast change. The wound on her foot actually looks quite good on the right first metatarsal head however she clearly has a cast injury at the superior margin of the cast just at the folded level this was in spite of padding this area. 4/24; wound on the right first metatarsal head looks better. The cast injury wounds on the anterior tibial area also look better. 5/1 wound on the right first metatarsal head is closed over. The cost injury wounds on the anterior tibial are also closed. The patient has type 1 diabetes with polyneuropathy. I do not think she has the financial means for diabetic shoes with custom inserts READMISSION 03/20/2019 This is a patient with type 1 diabetes and polyneuropathy. She was seen earlier this year with a wound on the right first metatarsal head the closed over. We recommended that she offload this in her shoes. I do not believe that was done but in any case her wounds reopened sometime in October. She was seen in the ER on 10/20 with a diabetic foot ulcer. Right foot was swollen and erythematous. She was put on antibiotics although at the time of this dictation I am not exactly sure which one. An x-ray was done that showed no osteo-. She has been soaking her foot in Epson salts and applying dry gauze. She is still working at [employer]. She has 4 open areas; one at the base of the second through fourth toes. One at the first met head  1 over the second met head and 1 over the midfoot. The second met head wound probes proximally towards her midfoot and midfoot wound probes superiorly although I could not connect these 2 wounds. There is no purulent drainage. A considerable amount of denuded skin over this area which I think is a remanent of cellulitis although the medial part of her foot is still erythematous there is no tenderness. For the most part I think this looks like treated cellulitis  from her antibiotics in October. She has a superficial area of epithelial loss on the right lower mid tibia Past medical history; patient is a type I diabetic with polyneuropathy. She is not felt to have PAD.Marland Kitchen She is unaware of her hemoglobin A1c she is supposed to see an endocrinologist this afternoon. 11/19; MRI is tomorrow. Culture I did last time showed group G strep. This would have been covered by the empiric Augmentin I gave her when I first saw her. She should have 3 more days of this I am going to extend this by another 4 days until we have time to digest the MRI. If she has osteomyelitis she is going to need to see infectious disease for IV antibiotics. She has not been systemically unwell. She is tolerating the Augmentin well Objective Constitutional Patient is hypertensive.. Pulse regular and within target range for patient.Marland Kitchen Respirations regular, non-labored and within target range.. Temperature is normal and within the target range for the patient.Marland Kitchen Appears in no distress. Vitals Time Taken: 10:43 AM, Height: 68 in, Weight: 137 lbs, BMI: 20.8, Temperature: 97.9 F, Pulse: 113 bpm, Respiratory Rate: 18 breaths/min, Blood Pressure: 146/89 mmHg, Capillary Blood Glucose: 105 mg/dl. General Notes: patient reported last CBG of 105 Eyes Conjunctivae clear. No discharge.no icterus. Respiratory work of breathing is normal. Cardiovascular Fetal pulses are easily palpable. Lymphatic None palpable in the popliteal area on the right. Musculoskeletal There is no actual joint tenderness in the foot however she has neuropathy.. Psychiatric appears at normal baseline. General Notes: Wound exam; the foot still has forefoot swelling and there is erythema on the dorsal foot to just beyond the metatarsal heads by a centimeter or 2 there is no palpable tenderness. The center wound is a small wound that tunnels. The area on the lateral foot has eschar on it today and then there is an area on the  metatarsal heads. None of this looks particularly different from last time. Integumentary (Hair, Skin) Erythema in the forefoot. Wound #4 status is Open. Original cause of wound was Gradually Appeared. The wound is located on the [place]. The wound measures 1.5cm length x 2.7cm width x 2.2cm depth; 3.181cm^2 area and 6.998cm^3 volume. There is Fat Layer (Subcutaneous Tissue) Exposed exposed. There is no tunneling or undermining noted. There is a large amount of purulent drainage noted. Foul odor after cleansing was noted. The wound margin is well defined and not attached to the wound base. There is small (1-33%) pink granulation within the wound bed. There is a large (67-100%) amount of necrotic tissue within the wound bed including Adherent Slough. Wound #5 status is Open. Original cause of wound was Gradually Appeared. The wound is located on the Right Metatarsal head first. The wound measures 0.7cm length x 0.5cm width x 0.1cm depth; 0.275cm^2 area and 0.027cm^3 volume. There is no tunneling or undermining noted. There is a none present amount of drainage noted. The wound margin is flat and intact. There is no granulation within the wound bed. There is a small (  1-33%) amount of necrotic tissue within the wound bed including Eschar. Wound #6 status is Open. Original cause of wound was Gradually Appeared. The wound is located on the Right Metatarsal head second. The wound measures 0.5cm length x 0.2cm width x 0.9cm depth; 0.079cm^2 area and 0.071cm^3 volume. There is Fat Layer (Subcutaneous Tissue) Exposed exposed. There is no tunneling or undermining noted. There is a medium amount of serous drainage noted. The wound margin is thickened. There is medium (34-66%) red granulation within the wound bed. There is a medium (34-66%) amount of necrotic tissue within the wound bed including Adherent Slough. Wound #7 status is Open. Original cause of wound was Gradually Appeared. The  wound is located on the Right,Proximal,Plantar Foot. The wound measures 0.5cm length x 1.3cm width x 0.1cm depth; 0.511cm^2 area and 0.051cm^3 volume. There is Fat Layer (Subcutaneous Tissue) Exposed exposed. There is no tunneling noted, however, there is undermining starting at 9:00 and ending at 12:00 with a maximum distance of 1.9cm. There is a small amount of serous drainage noted. The wound margin is well defined and not attached to the wound base. There is medium (34-66%) red granulation within the wound bed. There is a medium (34-66%) amount of necrotic tissue within the wound bed. Wound #8 status is Open. Original cause of wound was Blister. The wound is located on the Right,Anterior Lower Leg. The wound measures 0.5cm length x 3.1cm width x 0.9cm depth; 1.217cm^2 area and 1.096cm^3 volume. The wound is limited to skin breakdown. There is no tunneling or undermining noted. There is a none present amount of drainage noted. The wound margin is flat and intact. There is no granulation within the wound bed. There is a medium (34-66%) amount of necrotic tissue within the wound bed including Eschar. 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 - to see hoyt next week. Dressing Change Frequency: Change dressing every day. Wound Cleansing: Clean wound with Normal Saline. May shower and wash wound with soap and water. Primary Wound Dressing: Wound #4 Right,Distal,Plantar Foot: Calcium Alginate with Silver - pack into undermining  areas Wound #5 Right Metatarsal head first: Calcium Alginate with Silver - pack into undermining areas Wound #6 Right Metatarsal head second: Calcium Alginate with Silver - pack into undermining areas Wound #7 Right,Proximal,Plantar Foot: Calcium Alginate with Silver - pack into undermining areas Wound #8 Right,Anterior Lower Leg: Calcium Alginate with Silver - pack into undermining areas Secondary Dressing: Kerlix/Rolled Gauze - secure with tape Dry Gauze ABD pad Drawtex Edema Control: Avoid standing for long periods of time Elevate legs to the level of the heart or above for 30 minutes daily and/or when sitting, a frequency of: - 3 to 4 times a day throughout the day. Off-Loading: Open toe surgical shoe to: - on right foot. The following medication(s) was prescribed: Augmentin oral 875 mg-125 mg tablet 1tablet oral bid for an additional 3 days (total of 2 weeks rx) for diabetic foot infection starting 03/29/2019 1. The foot does not look any worse than last week but not a lot better either. The patient is not systemically unwell. The  Augmentin that I started empirically when I saw her a week ago should have covered the group G strep we cover cultured out of the right midfoot wound. 2. Her MRI is booked tomorrow. Depending on this she is likely to require to see infectious disease 3. Her wounds generally look about the same. The area laterally over the met heads has eschared over I did not debride this today. Midfoot wound some tunneling superiorly. 4. She is offloading this in a surgical shoe 5. I have extended her antibiotics for additional 3 days [Augmentin] for a total of 2 weeks of treatment Electronic Signature(s) Signed: 03/29/2019 6:25:25 PM By: Linton Ham MD Entered By: Linton Ham on 03/29/2019 11:56:28 -------------------------------------------------------------------------------- SuperBill Details Patient Name: Date of Service: [name]  03/29/2019 Medical Record [id number] Patient Account Number: [id number] Date of Birth/Sex: Treating RN: 09-04-1995 (23 y.o. Helene Shoe, Tammi Klippel Primary Care Provider: Baltazar Apo Other Clinician: Referring Provider: Treating Provider/Extender:Aleksa Collinsworth, Evlyn Kanner, [place] Dom in Treatment: 1 Diagnosis Coding ICD-10 Codes Code Description E10.621 Type 1 diabetes mellitus with foot ulcer L03.115 Cellulitis of right lower limb E10.42 Type 1 diabetes mellitus with diabetic polyneuropathy L97.518 Non-pressure chronic ulcer of other part of right foot with other specified severity L97.512 Non-pressure chronic ulcer of other part of right foot with fat layer exposed L97.811 Non-pressure chronic ulcer of other part of right lower leg limited to breakdown of skin I89.0 Lymphedema, not elsewhere classified Facility Procedures CPT4 Code: [id number] Description: 99213 - WOUND CARE VISIT-LEV 3 EST PT Modifier: Quantity: 1 Physician Procedures CPT4 Code Description: [id number] 99214 - WC PHYS LEVEL 4 - EST PT ICD-10 Diagnosis Description L03.115 Cellulitis of right lower limb E10.621 Type 1 diabetes mellitus with foot ulcer L97.518 Non-pressure chronic ulcer of other part of right foot w Modifier: ith other specified Quantity: 1 severity Electronic Signature(s) Signed: 03/29/2019 6:25:25 PM By: Linton Ham MD Entered By: Linton Ham on 03/29/2019 11:56:52

## 2019-03-29 NOTE — Progress Notes (Signed)
DANNI, MCTEAR ([id number]) Visit Report for 03/29/2019 Arrival Information Details Patient Name: Date of Service: Lindsey Murillo, Lindsey Murillo 03/29/2019 10:15 AM Medical Record [id number] Patient Account Number: [id number] Date of Birth/Sex: Treating RN: 1996-04-08 (23 y.o. Lindsey Murillo, Lindsey Murillo Primary Care Vernie Piet: Baltazar Apo Other Clinician: Referring Keilee Denman: Treating Wilford Merryfield/Extender:Robson, Evlyn Kanner, [place] Dom in Treatment: 1 Visit Information History Since Last Visit Added or deleted any medications: No Patient Arrived: Ambulatory Any new allergies or adverse reactions: No Arrival Time: 10:42 Had a fall or experienced change in No Accompanied By: family activities of daily living that may affect member risk of falls: Transfer Assistance: None Signs or symptoms of abuse/neglect since last No Patient Identification Verified: Yes visito Secondary Verification Process Yes Hospitalized since last visit: No Completed: Implantable device outside of the clinic excluding No Patient Requires Transmission-Based No cellular tissue based products placed in the center Precautions: since last visit: Patient Has Alerts: No Has Dressing in Place as Prescribed: Yes Pain Present Now: No Electronic Signature(s) Signed: 03/29/2019 6:05:31 PM By: Kela Millin Entered By: Kela Millin on 03/29/2019 10:43:22 -------------------------------------------------------------------------------- Clinic Level of Care Assessment Details Patient Name: Date of Service: BEATRIC, PATRY 03/29/2019 10:15 AM Medical Record [id number] Patient Account Number: [id number] Date of Birth/Sex: Treating RN: 06-Apr-1996 (23 y.o. Lindsey Murillo, Lindsey Murillo Primary Care Kassandra Meriweather: Baltazar Apo Other Clinician: Referring Lindsey Murillo: Treating Lindsey Murillo/Extender:Robson, Evlyn Kanner, [place] Dom in Treatment: 1 Clinic Level of Care Assessment Items TOOL 4 Quantity  Score X - Use when only an EandM is performed on FOLLOW-UP visit 1 0 ASSESSMENTS - Nursing Assessment / Reassessment X - Reassessment of Co-morbidities (includes updates in patient status) 1 10 X - Reassessment of Adherence to Treatment Plan 1 5 ASSESSMENTS - Wound and Skin Assessment / Reassessment X - Simple Wound Assessment / Reassessment - one wound 1 5 []  - Complex Wound Assessment / Reassessment - multiple wounds 0 X - Dermatologic / Skin Assessment (not related to wound area) 1 10 ASSESSMENTS - Focused Assessment X - Circumferential Edema Measurements - multi extremities 1 5 X - Nutritional Assessment / Counseling / Intervention 1 10 []  - Lower Extremity Assessment (monofilament, tuning fork, pulses) 0 []  - Peripheral Arterial Disease Assessment (using hand held doppler) 0 ASSESSMENTS - Ostomy and/or Continence Assessment and Care []  - Incontinence Assessment and Management 0 []  - Ostomy Care Assessment and Management (repouching, etc.) 0 PROCESS - Coordination of Care X - Simple Patient / Family Education for ongoing care 1 15 []  - Complex (extensive) Patient / Family Education for ongoing care 0 X - Staff obtains Programmer, systems, Records, Test Results / Process Orders 1 10 []  - Staff telephones HHA, Nursing Homes / Clarify orders / etc 0 []  - Routine Transfer to another Facility (non-emergent condition) 0 []  - Routine Hospital Admission (non-emergent condition) 0 []  - New Admissions / Biomedical engineer / Ordering NPWT, Apligraf, etc. 0 []  - Emergency Hospital Admission (emergent condition) 0 X - Simple Discharge Coordination 1 10 []  - Complex (extensive) Discharge Coordination 0 PROCESS - Special Needs []  - Pediatric / Minor Patient Management 0 []  - Isolation Patient Management 0 []  - Hearing / Language / Visual special needs 0 []  - Assessment of Community assistance (transportation, D/C planning, etc.) 0 []  - Additional assistance / Altered mentation 0 []  - Support  Surface(s) Assessment (bed, cushion, seat, etc.) 0 INTERVENTIONS - Wound Cleansing / Measurement X - Simple Wound Cleansing - one wound 1 5 []  - Complex Wound Cleansing - multiple wounds 0 X -  Wound Imaging (photographs - any number of wounds) 1 5 []  - Wound Tracing (instead of photographs) 0 X - Simple Wound Measurement - one wound 1 5 []  - Complex Wound Measurement - multiple wounds 0 INTERVENTIONS - Wound Dressings []  - Small Wound Dressing one or multiple wounds 0 X - Medium Wound Dressing one or multiple wounds 1 15 []  - Large Wound Dressing one or multiple wounds 0 []  - Application of Medications - topical 0 []  - Application of Medications - injection 0 INTERVENTIONS - Miscellaneous []  - External ear exam 0 []  - Specimen Collection (cultures, biopsies, blood, body fluids, etc.) 0 []  - Specimen(s) / Culture(s) sent or taken to Lab for analysis 0 []  - Patient Transfer (multiple staff / Civil Service fast streamer / Similar devices) 0 []  - Simple Staple / Suture removal (25 or less) 0 []  - Complex Staple / Suture removal (26 or more) 0 []  - Hypo / Hyperglycemic Management (close monitor of Blood Glucose) 0 []  - Ankle / Brachial Index (ABI) - do not check if billed separately 0 X - Vital Signs 1 5 Has the patient been seen at the hospital within the last three years: Yes Total Score: 115 Level Of Care: New/Established - Level 3 Electronic Signature(s) Signed: 03/29/2019 6:38:14 PM By: Deon Pilling Entered By: Deon Pilling on 03/29/2019 11:47:29 -------------------------------------------------------------------------------- Encounter Discharge Information Details Patient Name: Date of Service: Lindsey Murillo. 03/29/2019 10:15 AM Medical Record [id number] Patient Account Number: [id number] Date of Birth/Sex: Treating RN: January 30, 1996 (23 y.o. Lindsey Murillo Primary Care Kameelah Minish: Baltazar Apo Other Clinician: Referring Illana Nolting: Treating Leron Stoffers/Extender:Robson,  Evlyn Kanner, [place] Dom in Treatment: 1 Encounter Discharge Information Items Discharge Condition: Stable Ambulatory Status: Ambulatory Discharge Destination: Home Transportation: Private Auto Accompanied By: self Schedule Follow-up Appointment: Yes Clinical Summary of Care: Patient Declined Electronic Signature(s) Signed: 03/29/2019 6:27:04 PM By: Baruch Gouty RN, BSN Entered By: Baruch Gouty on 03/29/2019 12:18:29 -------------------------------------------------------------------------------- Lower Extremity Assessment Details Patient Name: Date of Service: DYESHA, RIFF 03/29/2019 10:15 AM Medical Record [id number] Patient Account Number: [id number] Date of Birth/Sex: Treating RN: Apr 16, 1996 (23 y.o. Clearnce Sorrel Primary Care Kolden Dupee: Baltazar Apo Other Clinician: Referring Maurita Havener: Treating Jakiera Ehler/Extender:Robson, Evlyn Kanner, [place] Dom in Treatment: 1 Edema Assessment Assessed: [Left: No] [Right: No] Edema: [Left: Yes] [Right: Yes] Calf Left: Right: Point of Measurement: cm From Medial Instep 38 cm 42.5 cm Ankle Left: Right: Point of Measurement: cm From Medial Instep 28 cm 30.8 cm Vascular Assessment Pulses: Dorsalis Pedis Palpable: [Left:Yes] [Right:Yes] Electronic Signature(s) Signed: 03/29/2019 6:05:31 PM By: Kela Millin Entered By: Kela Millin on 03/29/2019 10:48:51 -------------------------------------------------------------------------------- Multi Wound Chart Details Patient Name: Date of Service: Lindsey Murillo. 03/29/2019 10:15 AM Medical Record [id number] Patient Account Number: [id number] Date of Birth/Sex: Treating RN: Oct 30, 1995 (23 y.o. Lindsey Murillo, Tammi Klippel Primary Care Lanyla Costello: Baltazar Apo Other Clinician: Referring Biruk Troia: Treating Rethel Sebek/Extender:Robson, Evlyn Kanner, [place] Dom in Treatment: 1 Vital Signs Height(in): 68 Capillary Blood  105 Glucose(mg/dl): Weight(lbs): 137 Pulse(bpm): 113 Body Mass Index(BMI): 21 Blood Pressure(mmHg): 146/89 Temperature(F): 97.9 Respiratory 18 Rate(breaths/min): Photos: [4:No Photos] [5:No Photos] [6:No Photos] Wound Location: [4:Right Foot - Plantar, Distal Right Metatarsal head first Right Metatarsal head] [6:second] Wounding Event: [4:Gradually Appeared] [5:Gradually Appeared] [6:Gradually Appeared] 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] [5:Lower Extremity] [6:Lower Extremity] Secondary Etiology: [4:N/A] [5:N/A] [6:N/A] Comorbid History: [4:Anemia, Type I Diabetes Anemia, Type I Diabetes Anemia, Type I Diabetes] Date Acquired: [4:01/09/2019] [5:01/09/2019] [6:01/09/2019] Weeks of Treatment: [4:1] [  5:1] [6:1] Wound Status: [4:Open] [5:Open] [6:Open] Measurements L x W x D 1.5x2.7x2.2 [5:0.7x0.5x0.1] [6:0.5x0.2x0.9] (cm) Area (cm) : [4:3.181] [5:0.275] [6:0.079] Volume (cm) : [id number] [5:0.027] [6:0.071] % Reduction in Area: [4:42.10%] [5:-133.10%] [6:44.00%] % Reduction in Volume: -536.20% [5:-12.50%] [6:37.20%] Undermining: [4:No] [5:No] [6:No] Classification: [4:Grade 1] [5:Grade 1] [6:Grade 1] Exudate Amount: [4:Large] [5:None Present] [6:Medium] Exudate Type: [4:Purulent] [5:N/A] [6:Serous] Exudate Color: [4:yellow, brown, green] [5:N/A] [6:amber] Foul Odor After Cleansing:Yes [5:No] [6:No] Odor Anticipated Due to No [5:N/A] [6:N/A] Product Use: Wound Margin: [4:Well defined, not attached Flat and Intact] [6:Thickened] Granulation Amount: [4:Small (1-33%)] [5:None Present (0%)] [6:Medium (34-66%)] Granulation Quality: [4:Pink] [5:N/A] [6:Red] Necrotic Amount: [4:Large (67-100%)] [5:Small (1-33%)] [6:Medium (34-66%)] Necrotic Tissue: [4:Adherent Slough] [5:Eschar] [6:Adherent Slough] Exposed Structures: [4:Fat Layer (Subcutaneous Tissue) Exposed: Yes Fascia: No Tendon: No Muscle: No Joint: No  Bone: No] [5:Fascia: No Fat Layer (Subcutaneous Tissue) Exposed: No Tendon: No Muscle: No Joint: No Bone: No] [6:Fat Layer (Subcutaneous Tissue) Exposed: Yes  Fascia: No Tendon: No Muscle: No Joint: No Bone: No] Epithelialization: [4:Small (1-33%)] [5:Small (1-33%) 7 8] [6:None N/A] Photos: [4:No Photos] [5:No Photos] [6:N/A] Wound Location: [4:Right Foot - Plantar, Proximal] [5:Right Lower Leg - Anterior N/A] Wounding Event: [4:Gradually Appeared] [5:Blister] [6:N/A] 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] [5:Lower Extremity] Secondary Etiology: [4:N/A] [5:Lymphedema] [6:N/A] Comorbid History: [4:Anemia, Type I Diabetes Anemia, Type I Diabetes N/A] Date Acquired: [4:01/09/2019] [5:03/18/2019] [6:N/A] Weeks of Treatment: [4:1] [5:1] [6:N/A] Wound Status: [4:Open] [5:Open] [6:N/A] Measurements L x W x D 0.5x1.3x0.1 [5:0.5x3.1x0.9] [6:N/A] (cm) Area (cm) : [4:0.511] [5:1.217] [6:N/A] Volume (cm) : [4:0.051] [5:1.096] [6:N/A] % Reduction in Area: [4:-16.10%] [5:79.20%] [6:N/A] % Reduction in Volume: 83.40% [5:-87.70%] [6:N/A] Starting Position 1 [4:9] (o'clock): Ending Position 1 [4:12] (o'clock): Maximum Distance 1 [4:1.9] (cm): Undermining: [4:Yes] [5:No] [6:N/A] Classification: [4:Grade 2] [5:Grade 1] [6:N/A] Exudate Amount: [4:Small] [5:None Present] [6:N/A] Exudate Type: [4:Serous] [5:N/A] [6:N/A] Exudate Color: [4:amber] [5:N/A] [6:N/A] Foul Odor After Cleansing:No [5:No] [6:N/A] Odor Anticipated Due to N/A [5:N/A] [6:N/A] Product Use: Wound Margin: [4:Well defined, not attached Flat and Intact] [6:N/A] Granulation Amount: [4:Medium (34-66%)] [5:None Present (0%)] [6:N/A] Granulation Quality: [4:Red] [5:N/A] [6:N/A] Necrotic Amount: [4:Medium (34-66%)] [5:Medium (34-66%)] [6:N/A] Necrotic Tissue: [4:N/A] [5:Eschar] [6:N/A] Exposed Structures: [4:Fat Layer (Subcutaneous Fascia: No Tissue) Exposed: Yes Fascia: No Tendon:  No Muscle: No Joint: No Bone: No None] [5:Fat Layer (Subcutaneous Tissue) Exposed: No Tendon: No Muscle: No Joint: No Bone: No Limited to Skin Breakdown Small (1-33%)] [6:N/A  N/A] Treatment Notes Electronic Signature(s) Signed: 03/29/2019 6:25:25 PM By: Linton Ham MD Signed: 03/29/2019 6:38:14 PM By: Deon Pilling Entered By: Linton Ham on 03/29/2019 11:48:46 -------------------------------------------------------------------------------- Multi-Disciplinary Care Plan Details Patient Name: Date of Service: Lindsey Murillo. 03/29/2019 10:15 AM Medical Record [id number] Patient Account Number: [id number] Date of Birth/Sex: Treating RN: Mar 01, 1996 (23 y.o. Lindsey Murillo, Tammi Klippel Primary Care Child Campoy: Baltazar Apo Other Clinician: Referring Mong Neal: Treating Dannel Rafter/Extender:Robson, Evlyn Kanner, [place] Dom in Treatment: 1 Active Inactive Wound/Skin Impairment Nursing Diagnoses: Knowledge deficit related to ulceration/compromised skin integrity Goals: Patient/caregiver will verbalize understanding of skin care regimen Date Initiated: 03/20/2019 Target Resolution Date: 04/20/2019 Goal Status: Active Ulcer/skin breakdown will have a volume reduction of 30% by week 4 Date Initiated: 03/20/2019 Target Resolution Date: 04/20/2019 Goal Status: Active Interventions: Assess patient/caregiver ability to obtain necessary supplies Assess patient/caregiver ability to perform ulcer/skin care regimen upon admission and as needed Assess ulceration(s) every visit Notes: Electronic Signature(s) Signed: 03/29/2019 6:38:14 PM By: Deon Pilling Entered By:  Deon Pilling on 03/29/2019 11:04:03 -------------------------------------------------------------------------------- Pain Assessment Details Patient Name: FELICA, COWARD. Date of Service: 03/29/2019 10:15 AM Medical Record [id number] Patient Account Number: [id number] Date of Birth/Sex: Treating  RN: 1995/11/07 (23 y.o. Clearnce Sorrel Primary Care Quanna Wittke: Other Clinician: Baltazar Apo Referring Bexley Laubach: Treating Addalee Kavanagh/Extender:Robson, Evlyn Kanner, [place] Dom in Treatment: 1 Active Problems Location of Pain Severity and Description of Pain Patient Has Paino No Site Locations Pain Management and Medication Current Pain Management: Electronic Signature(s) Signed: 03/29/2019 6:05:31 PM By: Kela Millin Entered By: Kela Millin on 03/29/2019 10:47:35 -------------------------------------------------------------------------------- Patient/Caregiver Education Details Normajean Baxter, [place] 11/19/2020andnbsp10:15 Patient Name: Date of Service: R. AM Medical Record Patient Account Number: [id number] Number: Treating RN: Deon Pilling Date of Birth/Gender: 10/13/1995 (23 y.o. F) Other Clinician: Primary Care Treating Baird Kay Physician: Physician/Extender: Referring Physician: Marylin Crosby in Treatment: 1 Education Assessment Education Provided To: Patient Education Topics Provided Wound/Skin Impairment: Handouts: Caring for Your Ulcer Methods: Explain/Verbal Responses: Reinforcements needed Electronic Signature(s) Signed: 03/29/2019 6:38:14 PM By: Deon Pilling Entered By: Deon Pilling on 03/29/2019 11:04:15 -------------------------------------------------------------------------------- Wound Assessment Details Patient Name: Date of Service: CAOILAINN, QUICKLE 03/29/2019 10:15 AM Medical Record [id number] Patient Account Number: [id number] Date of Birth/Sex: Treating RN: Oct 29, 1995 (23 y.o. Clearnce Sorrel Primary Care Levada Bowersox: Baltazar Apo Other Clinician: Referring Brantlee Hinde: Treating Angenette Daily/Extender:Robson, Evlyn Kanner, [place] Dom in Treatment: 1 Wound Status Wound Number: 4 Primary Diabetic Wound/Ulcer of the Lower Etiology: Extremity Wound Location: Right Foot  - Plantar, Distal Wound Status: Open Wounding Event: Gradually Appeared Comorbid Anemia, Type I Diabetes Date Acquired: 01/09/2019 History: Weeks Of Treatment: 1 Clustered Wound: No Wound Measurements Length: (cm) 1.5 Width: (cm) 2.7 Depth: (cm) 2.2 Area: (cm) 3.181 Volume: (cm) 6.998 Wound Description Classification: Grade 1 Wound Margin: Well defined, not attached Exudate Amount: Large Exudate Type: Purulent Exudate Color: yellow, brown, green Wound Bed Granulation Amount: Small (1-33%) Granulation Quality: Pink Necrotic Amount: Large (67-100%) Necrotic Quality: Adherent Slough After Cleansing: Yes oduct Use: No brino Yes Exposed Structure posed: No (Subcutaneous Tissue) Exposed: Yes posed: No posed: No osed: No sed: No % Reduction in Area: 42.1% % Reduction in Volume: -536.2% Epithelialization: Small (1-33%) Tunneling: No Undermining: No Foul Odor Due to Pr Slough/Fi Fascia Ex Fat Layer Tendon Ex Muscle Ex Joint Exp Bone Expo Treatment Notes Wound #4 (Right, Distal, Plantar Foot) 3. Primary Dressing Applied Calcium Alginate Ag 4. Secondary Dressing Dry Gauze Roll Gauze 7. Footwear/Offloading device applied Surgical Murillo Electronic Signature(s) Signed: 03/29/2019 6:05:31 PM By: Kela Millin Entered By: Kela Millin on 03/29/2019 10:59:29 -------------------------------------------------------------------------------- Wound Assessment Details Patient Name: Date of Service: NIYATI, AMBLE 03/29/2019 10:15 AM Medical Record [id number] Patient Account Number: [id number] Date of Birth/Sex: Treating RN: [birth year] (23 y.o. Clearnce Sorrel Primary Care Stclair Szymborski: Baltazar Apo Other Clinician: Referring Luan Urbani: Treating Kaida Games/Extender:Robson, Evlyn Kanner, [place] Dom in Treatment: 1 Wound Status Wound Number: 5 Primary Diabetic Wound/Ulcer of the Lower Etiology: Extremity Wound Location: Right Metatarsal  head first Wound Status: Open Wounding Event: Gradually Appeared Comorbid Anemia, Type I Diabetes Date Acquired: 01/09/2019 History: Weeks Of Treatment: 1 Clustered Wound: No Wound Measurements Length: (cm) 0.7 Width: (cm) 0.5 Depth: (cm) 0.1 Area: (cm) 0.275 Volume: (cm) 0.027 Wound Description Classification: Grade 1 Wound Margin: Flat and Intact Exudate Amount: None Present Wound Bed Granulation Amount: None Present (0%) Necrotic Amount: Small (1-33%) Necrotic Quality: Eschar After Cleansing: No rino No Exposed Structure osed: No (Subcutaneous Tissue) Exposed: No osed: No osed: No sed: No ed: No %  Reduction in Area: -133.1% % Reduction in Volume: -12.5% Epithelialization: Small (1-33%) Tunneling: No Undermining: No Foul Odor Slough/Fib Fascia Exp Fat Layer Tendon Exp Muscle Exp Joint Expo Bone Expos Treatment Notes Wound #5 (Right Metatarsal head first) 3. Primary Dressing Applied Calcium Alginate Ag 4. Secondary Dressing Dry Gauze Roll Gauze 7. Footwear/Offloading device applied Surgical Murillo Electronic Signature(s) Signed: 03/29/2019 6:05:31 PM By: Kela Millin Entered By: Kela Millin on 03/29/2019 11:00:17 -------------------------------------------------------------------------------- Wound Assessment Details Patient Name: Date of Service: KETSIA, LEATON 03/29/2019 10:15 AM Medical Record [id number] Patient Account Number: [id number] Date of Birth/Sex: Treating RN: [birth year] (23 y.o. Clearnce Sorrel Primary Care Coal Nearhood: Baltazar Apo Other Clinician: Referring Satara Virella: Treating Deeric Cruise/Extender:Robson, Evlyn Kanner, [place] Dom in Treatment: 1 Wound Status Wound Number: 6 Primary Diabetic Wound/Ulcer of the Lower Etiology: Extremity Wound Location: Right Metatarsal head second Wound Status: Open Wounding Event: Gradually Appeared Comorbid Anemia, Type I Diabetes Date Acquired:  01/09/2019 History: Weeks Of Treatment: 1 Clustered Wound: No Wound Measurements Length: (cm) 0.5 Width: (cm) 0.2 Depth: (cm) 0.9 Area: (cm) 0.079 Volume: (cm) 0.071 Wound Description Classification: Grade 1 Wound Margin: Thickened Exudate Amount: Medium Exudate Type: Serous Exudate Color: amber Wound Bed Granulation Amount: Medium (34-66%) Granulation Quality: Red Necrotic Amount: Medium (34-66%) Necrotic Quality: Adherent Slough fter Cleansing: No ino Yes Exposed Structure sed: No Subcutaneous Tissue) Exposed: Yes sed: No sed: No ed: No d: No % Reduction in Area: 44% % Reduction in Volume: 37.2% Epithelialization: None Tunneling: No Undermining: No Foul Odor A Slough/Fibr Fascia Expo Fat Layer ( Tendon Expo Muscle Expo Joint Expos Bone Expose Treatment Notes Wound #6 (Right Metatarsal head second) 3. Primary Dressing Applied Calcium Alginate Ag 4. Secondary Dressing Dry Gauze Roll Gauze 7. Footwear/Offloading device applied Surgical Murillo Electronic Signature(s) Signed: 03/29/2019 6:05:31 PM By: Kela Millin Entered By: Kela Millin on 03/29/2019 11:00:47 -------------------------------------------------------------------------------- Wound Assessment Details Patient Name: Date of Service: SHAELIE, SANJURJO 03/29/2019 10:15 AM Medical Record [id number] Patient Account Number: [id number] Date of Birth/Sex: Treating RN: 09-04-1995 (23 y.o. Clearnce Sorrel Primary Care Henning Ehle: Baltazar Apo Other Clinician: Referring Nariah Morgano: Treating Kynan Peasley/Extender:Robson, Evlyn Kanner, [place] Dom in Treatment: 1 Wound Status Wound Number: 7 Primary Diabetic Wound/Ulcer of the Lower Etiology: Extremity Wound Location: Right Foot - Plantar, Proximal Wound Status: Open Wounding Event: Gradually Appeared Comorbid Anemia, Type I Diabetes Date Acquired: 01/09/2019 History: Weeks Of Treatment: 1 Clustered Wound: No Wound  Measurements Length: (cm) 0.5 Width: (cm) 1.3 Depth: (cm) 0.1 Area: (cm) 0.511 Volume: (cm) 0.051 % Reduction in Area: -16.1% % Reduction in Volume: 83.4% Epithelialization: None Tunneling: No Undermining: Yes Starting Position (o'clock): 9 Ending Position (o'clock): 12 Maximum Distance: (cm) 1.9 Wound Description Classification: Grade 2 Foul Odor Wound Margin: Well defined, not attached Slough/Fib Exudate Amount: Small Exudate Type: Serous Exudate Color: amber Wound Bed Granulation Amount: Medium (34-66%) Granulation Quality: Red Fascia Exp Necrotic Amount: Medium (34-66%) Fat Layer Tendon Exp Muscle Exp Joint Expo Bone Expos After Cleansing: No rino Yes Exposed Structure osed: No (Subcutaneous Tissue) Exposed: Yes osed: No osed: No sed: No ed: No Treatment Notes Wound #7 (Right, Proximal, Plantar Foot) 3. Primary Dressing Applied Calcium Alginate Ag 4. Secondary Dressing Dry Gauze Roll Gauze 7. Footwear/Offloading device applied Surgical Murillo Electronic Signature(s) Signed: 03/29/2019 6:05:31 PM By: Kela Millin Entered By: Kela Millin on 03/29/2019 11:01:34 -------------------------------------------------------------------------------- Wound Assessment Details Patient Name: Date of Service: TYRENE, BOWES 03/29/2019 10:15 AM Medical Record [id number] Patient Account Number: [id number] Date of Birth/Sex: Treating RN: [birth year] (23  y.o. Clearnce Sorrel Primary Care Patra Gherardi: Baltazar Apo Other Clinician: Referring Edwin Cherian: Treating Ranada Vigorito/Extender:Robson, Evlyn Kanner, [place] Dom in Treatment: 1 Wound Status Wound Number: 8 Primary Etiology: Diabetic Wound/Ulcer of the Lower Extremity Wound Location: Right Lower Leg - Anterior Secondary Lymphedema Wounding Event: Blister Etiology: Date Acquired: 03/18/2019 Wound Status: Open Weeks Of Treatment: 1 Comorbid Anemia, Type I Diabetes Clustered Wound:  No History: Wound Measurements Length: (cm) 0.5 Width: (cm) 3.1 Depth: (cm) 0.9 Area: (cm) 1.217 Volume: (cm) 1.096 Wound Description Classification: Grade 1 Wound Margin: Flat and Intact Exudate Amount: None Present Wound Bed Granulation Amount: None Present (0%) Necrotic Amount: Medium (34-66%) Necrotic Quality: Eschar After Cleansing: No rino No Exposed Structure osed: No (Subcutaneous Tissue) Exposed: No posed: No posed: No osed: No sed: No o Skin Breakdown % Reduction in Area: 79.2% % Reduction in Volume: -87.7% Epithelialization: Small (1-33%) Tunneling: No Undermining: No Foul Odor Slough/Fib Fascia Exp Fat Layer Tendon Ex Muscle Ex Joint Exp Bone Expo Limited t Treatment Notes Wound #8 (Right, Anterior Lower Leg) 3. Primary Dressing Applied Calcium Alginate Ag 4. Secondary Dressing Foam Border Dressing Electronic Signature(s) Signed: 03/29/2019 6:05:31 PM By: Kela Millin Entered By: Kela Millin on 03/29/2019 11:02:06 -------------------------------------------------------------------------------- Vitals Details Patient Name: Date of Service: Lindsey Murillo. 03/29/2019 10:15 AM Medical Record [id number] Patient Account Number: [id number] Date of Birth/Sex: Treating RN: Jul 30, 1995 (23 y.o. Clearnce Sorrel Primary Care Jadriel Saxer: Baltazar Apo Other Clinician: Referring Jacque Byron: Treating Ermelinda Eckert/Extender:Robson, Evlyn Kanner, [place] Dom in Treatment: 1 Vital Signs Time Taken: 10:43 Temperature (F): 97.9 Height (in): 68 Pulse (bpm): 113 Weight (lbs): 137 Respiratory Rate (breaths/min): 18 Body Mass Index (BMI): 20.8 Blood Pressure (mmHg): 146/89 Capillary Blood Glucose (mg/dl): 105 Reference Range: 80 - 120 mg / dl Notes patient reported last CBG of 105 Electronic Signature(s) Signed: 03/29/2019 6:05:31 PM By: Kela Millin Entered By: Kela Millin on 03/29/2019 10:44:40

## 2019-03-30 ENCOUNTER — Ambulatory Visit (HOSPITAL_COMMUNITY): Payer: PRIVATE HEALTH INSURANCE

## 2019-03-30 ENCOUNTER — Encounter (HOSPITAL_COMMUNITY): Payer: Self-pay

## 2019-03-30 ENCOUNTER — Ambulatory Visit: Payer: PRIVATE HEALTH INSURANCE | Admitting: "Endocrinology

## 2019-04-03 ENCOUNTER — Ambulatory Visit (HOSPITAL_BASED_OUTPATIENT_CLINIC_OR_DEPARTMENT_OTHER): Payer: PRIVATE HEALTH INSURANCE | Admitting: Physician Assistant

## 2019-04-10 ENCOUNTER — Other Ambulatory Visit: Payer: Self-pay

## 2019-04-10 ENCOUNTER — Encounter (HOSPITAL_BASED_OUTPATIENT_CLINIC_OR_DEPARTMENT_OTHER): Payer: PRIVATE HEALTH INSURANCE | Attending: Internal Medicine | Admitting: Internal Medicine

## 2019-04-10 DIAGNOSIS — I890 Lymphedema, not elsewhere classified: Secondary | ICD-10-CM | POA: Diagnosis not present

## 2019-04-10 DIAGNOSIS — L97811 Non-pressure chronic ulcer of other part of right lower leg limited to breakdown of skin: Secondary | ICD-10-CM | POA: Diagnosis not present

## 2019-04-10 DIAGNOSIS — D649 Anemia, unspecified: Secondary | ICD-10-CM | POA: Diagnosis not present

## 2019-04-10 DIAGNOSIS — E10621 Type 1 diabetes mellitus with foot ulcer: Secondary | ICD-10-CM | POA: Insufficient documentation

## 2019-04-10 DIAGNOSIS — L97512 Non-pressure chronic ulcer of other part of right foot with fat layer exposed: Secondary | ICD-10-CM | POA: Diagnosis not present

## 2019-04-10 DIAGNOSIS — M86471 Chronic osteomyelitis with draining sinus, right ankle and foot: Secondary | ICD-10-CM | POA: Diagnosis not present

## 2019-04-10 DIAGNOSIS — E1042 Type 1 diabetes mellitus with diabetic polyneuropathy: Secondary | ICD-10-CM | POA: Insufficient documentation

## 2019-04-11 ENCOUNTER — Other Ambulatory Visit (HOSPITAL_COMMUNITY)
Admission: RE | Admit: 2019-04-11 | Discharge: 2019-04-11 | Disposition: A | Discharge status: Home / Self Care | Payer: PRIVATE HEALTH INSURANCE | Source: Other Acute Inpatient Hospital | Attending: Internal Medicine | Admitting: Internal Medicine

## 2019-04-11 DIAGNOSIS — L97518 Non-pressure chronic ulcer of other part of right foot with other specified severity: Secondary | ICD-10-CM | POA: Insufficient documentation

## 2019-04-13 ENCOUNTER — Other Ambulatory Visit: Payer: Self-pay

## 2019-04-13 ENCOUNTER — Emergency Department (HOSPITAL_COMMUNITY)
Admission: EM | Admit: 2019-04-13 | Discharge: 2019-04-13 | Disposition: A | Discharge status: Home / Self Care | Payer: PRIVATE HEALTH INSURANCE | Attending: Emergency Medicine | Admitting: Emergency Medicine

## 2019-04-13 ENCOUNTER — Ambulatory Visit: Payer: PRIVATE HEALTH INSURANCE | Admitting: "Endocrinology

## 2019-04-13 ENCOUNTER — Encounter (HOSPITAL_COMMUNITY): Payer: Self-pay | Admitting: Emergency Medicine

## 2019-04-13 ENCOUNTER — Emergency Department (HOSPITAL_COMMUNITY): Payer: PRIVATE HEALTH INSURANCE

## 2019-04-13 DIAGNOSIS — Z79899 Other long term (current) drug therapy: Secondary | ICD-10-CM | POA: Insufficient documentation

## 2019-04-13 DIAGNOSIS — L03115 Cellulitis of right lower limb: Secondary | ICD-10-CM | POA: Insufficient documentation

## 2019-04-13 DIAGNOSIS — M86271 Subacute osteomyelitis, right ankle and foot: Secondary | ICD-10-CM | POA: Diagnosis not present

## 2019-04-13 DIAGNOSIS — E1021 Type 1 diabetes mellitus with diabetic nephropathy: Secondary | ICD-10-CM | POA: Insufficient documentation

## 2019-04-13 DIAGNOSIS — L0291 Cutaneous abscess, unspecified: Secondary | ICD-10-CM

## 2019-04-13 DIAGNOSIS — Z794 Long term (current) use of insulin: Secondary | ICD-10-CM | POA: Diagnosis not present

## 2019-04-13 DIAGNOSIS — Z4800 Encounter for change or removal of nonsurgical wound dressing: Secondary | ICD-10-CM | POA: Diagnosis present

## 2019-04-13 DIAGNOSIS — L02611 Cutaneous abscess of right foot: Secondary | ICD-10-CM | POA: Diagnosis not present

## 2019-04-13 DIAGNOSIS — J45909 Unspecified asthma, uncomplicated: Secondary | ICD-10-CM | POA: Diagnosis not present

## 2019-04-13 LAB — CBC WITH DIFFERENTIAL/PLATELET
Abs Immature Granulocytes: 0.02 10*3/uL (ref 0.00–0.07)
Basophils Absolute: 0.1 10*3/uL (ref 0.0–0.1)
Basophils Relative: 1 %
Eosinophils Absolute: 0.3 10*3/uL (ref 0.0–0.5)
Eosinophils Relative: 4 %
HCT: 26.5 % — ABNORMAL LOW (ref 36.0–46.0)
Hemoglobin: 8.2 g/dL — ABNORMAL LOW (ref 12.0–15.0)
Immature Granulocytes: 0 %
Lymphocytes Relative: 19 %
Lymphs Abs: 1.5 10*3/uL (ref 0.7–4.0)
MCH: 26.7 pg (ref 26.0–34.0)
MCHC: 30.9 g/dL (ref 30.0–36.0)
MCV: 86.3 fL (ref 80.0–100.0)
Monocytes Absolute: 0.4 10*3/uL (ref 0.1–1.0)
Monocytes Relative: 5 %
Neutro Abs: 5.6 10*3/uL (ref 1.7–7.7)
Neutrophils Relative %: 71 %
Platelets: 285 10*3/uL (ref 150–400)
RBC: 3.07 MIL/uL — ABNORMAL LOW (ref 3.87–5.11)
RDW: 15.3 % (ref 11.5–15.5)
WBC: 7.9 10*3/uL (ref 4.0–10.5)
nRBC: 0 % (ref 0.0–0.2)

## 2019-04-13 LAB — CBG MONITORING, ED
Glucose-Capillary: 48 mg/dL — ABNORMAL LOW (ref 70–99)
Glucose-Capillary: 58 mg/dL — ABNORMAL LOW (ref 70–99)

## 2019-04-13 LAB — BASIC METABOLIC PANEL
Anion gap: 8 (ref 5–15)
BUN: 24 mg/dL — ABNORMAL HIGH (ref 6–20)
CO2: 23 mmol/L (ref 22–32)
Calcium: 8.8 mg/dL — ABNORMAL LOW (ref 8.9–10.3)
Chloride: 109 mmol/L (ref 98–111)
Creatinine, Ser: 0.71 mg/dL (ref 0.44–1.00)
GFR calc Af Amer: >60 mL/min (ref >60)
GFR calc non Af Amer: >60 mL/min (ref >60)
Glucose, Bld: 78 mg/dL (ref 70–99)
Potassium: 4.1 mmol/L (ref 3.5–5.1)
Sodium: 140 mmol/L (ref 135–145)

## 2019-04-13 LAB — HCG, SERUM, QUALITATIVE: Preg, Serum: NEGATIVE

## 2019-04-13 MED ORDER — DOXYCYCLINE HYCLATE 100 MG PO CAPS: 100.0000 mg | ORAL_CAPSULE | Freq: Two times a day (BID) | ORAL | 0 refills | Status: DC

## 2019-04-13 MED ORDER — DOXYCYCLINE HYCLATE 100 MG PO TABS
100.0000 mg | ORAL_TABLET | Freq: Once | ORAL | Status: AC
  Administered 2019-04-13: 100 mg via ORAL
  Filled 2019-04-13: qty 1

## 2019-04-13 MED ORDER — GADOBUTROL 1 MMOL/ML IV SOLN
6.0000 mL | Freq: Once | INTRAVENOUS | Status: AC | PRN
  Administered 2019-04-13: 6 mL via INTRAVENOUS

## 2019-04-13 NOTE — Discharge Instructions (Addendum)
Take your next dose of the doxycycline tomorrow morning.  Continue with the dressing that was placed today.  Elevation of your foot can help with swelling.  Keep your appointment with Dr. Alford Highland on Tuesday as planned, also call for an appointment with Dr. Sharol Given  for further management of this infection.

## 2019-04-13 NOTE — ED Notes (Signed)
Pt given gram crackers, peanut butter, and juice for cbg of 48. Removed the dsg to the right lower limb. With julie idol at bed side

## 2019-04-13 NOTE — ED Provider Notes (Signed)
[facility] EMERGENCY DEPARTMENT Provider Note   CSN: [id number] Arrival date & time: 04/13/19  1050     History   Chief Complaint Chief Complaint  Patient presents with  . Wound Check    HPI Lindsey Murillo is a 23 y.o. female with a history of Type 1 DM with neuropathy under the care of Dr. Dellia Nims with the wound care clinic for right foot and tibial diabetic ulcerations was referred to the ed for MRI imaging of her right foot to assess for suspected osteomyelitis.  She has been treated with augmentin , last dose taken approximately 11/24.  She denies specific pain in the foot, reports occasional stabs of discomfort but for the most part is comfortable.  She denies fevers, chills, n/v or other concerns.      The history is provided by the patient.    Past Medical History:  Diagnosis Date  . Acanthosis nigricans, acquired   . Asthma   . Diabetic autonomic neuropathy ([place])   . Diabetic peripheral neuropathy ([place])   . Environmental allergies   . Goiter   . Hypoglycemia associated with diabetes ([place])   . Tachycardia   . Thyroiditis, autoimmune   . Type 1 diabetes mellitus in patient age 29-19 years with HbA1C goal below 7.5     Patient Active Problem List   Diagnosis Date Noted  . Vitamin D deficiency 03/20/2019  . Dyspepsia   . Weight loss, unintentional 08/26/2016  . Chronic diarrhea 04/09/2016  . Type 1 diabetes mellitus with peripheral circulatory complications ([place]) [id number]  . Noncompliance 02/26/2014  . Essential hypertension, benign 11/30/2012  . Acanthosis nigricans, acquired   . Hypoglycemia associated with diabetes ([place])   . Goiter   . Tachycardia   . Diabetic autonomic neuropathy ([place])   . Diabetic peripheral neuropathy ([place])   . Thyroiditis, autoimmune   . Asthma   . Environmental allergies     Past Surgical History:  Procedure Laterality Date  . BIOPSY  08/27/2016   Procedure: BIOPSY;  Surgeon: Danie Binder, MD;  Location: [facility];   Service: Endoscopy;;  duodenum; gastric  . COLONOSCOPY    . ESOPHAGOGASTRODUODENOSCOPY N/A 08/27/2016   Dr. Oneida Alar: mild gastritis. Negative celiac. No obvious source for dyspepsia/diarrhea     OB History    Gravida  1   Para      Term      Preterm      AB      Living        SAB      TAB      Ectopic      Multiple      Live Births               Home Medications    Prior to Admission medications   Medication Sig Start Date End Date Taking? Authorizing Provider  ibuprofen (ADVIL,MOTRIN) 200 MG tablet Take 600 mg by mouth daily as needed.   Yes [provider]  insulin aspart (NOVOLOG FLEXPEN) 100 UNIT/ML FlexPen Inject 8-11 Units into the skin 3 (three) times daily with meals. 03/20/19  Yes Nida, Marella Chimes, MD  Insulin Glargine (BASAGLAR KWIKPEN) 100 UNIT/ML SOPN Inject 0.3 mLs (30 Units total) into the skin at bedtime. 03/20/19  Yes Cassandria Anger, MD  amoxicillin-clavulanate (AUGMENTIN) 875-125 MG tablet Take 1 tablet by mouth 2 (two) times daily. Patient not taking: Reported on 04/13/2019 02/27/19   Mikey Kirschner, MD  Blood Glucose Monitoring Suppl (ACCU-CHEK  AVIVA) device Use as instructed Patient not taking: Reported on 04/13/2019 06/03/15   Cassandria Anger, MD  ciprofloxacin (CIPRO) 500 MG tablet Take 1 tablet (500 mg total) by mouth 2 (two) times daily. Patient not taking: Reported on 04/13/2019 02/27/19   Mikey Kirschner, MD  Continuous Blood Gluc Sensor (FREESTYLE LIBRE 14 DAY SENSOR) MISC Inject 1 each into the skin every 14 (fourteen) days. Use as directed. Patient not taking: Reported on 04/13/2019 03/20/19   Cassandria Anger, MD  doxycycline (VIBRAMYCIN) 100 MG capsule Take 1 capsule (100 mg total) by mouth 2 (two) times daily. 04/13/19   [name], PA-C  glucagon 1 MG injection Follow package directions for low blood sugar. Patient taking differently: as needed. Follow package directions for low blood sugar. 01/05/12    Sherrlyn Hock, MD  Vitamin D, Ergocalciferol, (DRISDOL) 1.25 MG (50000 UT) CAPS capsule Take 1 capsule (50,000 Units total) by mouth every 7 (seven) days. 03/20/19   Cassandria Anger, MD    Family History Family History  Problem Relation Age of Onset  . Diabetes Mother        Type II DM  . Thyroid disease Mother   . Diabetes Maternal Grandmother        Type II DM  . Diabetes Cousin        Type II DM  . Colon cancer Neg Hx   . Colon polyps Neg Hx     Social History Social History   Tobacco Use  . Smoking status: Never Smoker  . Smokeless tobacco: Never Used  Substance Use Topics  . Alcohol use: No  . Drug use: No     Allergies   Patient has no known allergies.   Review of Systems Review of Systems  Constitutional: Negative for chills and fever.  HENT: Negative.   Respiratory: Negative.   Gastrointestinal: Negative for diarrhea and vomiting.  Genitourinary: Negative.   Musculoskeletal: Positive for joint swelling. Negative for arthralgias and myalgias.  Skin: Positive for wound.  Neurological: Negative for weakness and numbness.     Physical Exam Updated Vital Signs BP (!) 127/98 (BP Location: Right Arm)   Pulse (!) 115   Temp 98 F (36.7 C) (Oral)   Resp 18   Ht 5\' 9"  (1.753 m)   Wt 59 kg   LMP 03/15/2019 (Approximate)   SpO2 100%   BMI 19.20 kg/m   Physical Exam Constitutional:      Appearance: She is well-developed.  HENT:     Head: Atraumatic.  Neck:     Musculoskeletal: Normal range of motion.  Cardiovascular:     Comments: Pulses equal bilaterally Musculoskeletal:        General: No tenderness.  Skin:    General: Skin is warm and dry.     Comments: Moderate edema bilateral lower extremities.  Ulceration inferior right toes along metatarsal heads, moist. Additional ulceration mid right anterior tibia.  No increased erythema or red streaking, although subtle erythema noted 5th toe.   Nontender to palpation.    Neurological:      Mental Status: She is alert.     Sensory: No sensory deficit.     Deep Tendon Reflexes: Reflexes normal.      ED Treatments / Results  Labs (all labs ordered are listed, but only abnormal results are displayed) Labs Reviewed  CBC WITH DIFFERENTIAL/PLATELET - Abnormal; Notable for the following components:      Result Value   RBC 3.07 (*)  Hemoglobin 8.2 (*)    HCT 26.5 (*)    All other components within normal limits  BASIC METABOLIC PANEL - Abnormal; Notable for the following components:   BUN 24 (*)    Calcium 8.8 (*)    All other components within normal limits  CBG MONITORING, ED - Abnormal; Notable for the following components:   Glucose-Capillary 48 (*)    All other components within normal limits  CBG MONITORING, ED - Abnormal; Notable for the following components:   Glucose-Capillary 58 (*)    All other components within normal limits  HCG, SERUM, QUALITATIVE  I-STAT BETA HCG BLOOD, ED ([facility], [facility], [facility] ONLY)    EKG None  Radiology Mr Foot Right W Wo Contrast  Result Date: 04/13/2019 CLINICAL DATA:  Diabetic patient with a wound along the right first metatarsal for several months. Pain and swelling. EXAM: MRI OF THE RIGHT FOREFOOT WITHOUT AND WITH CONTRAST TECHNIQUE: Multiplanar, multisequence MR imaging of the right forefoot was performed before and after the administration of intravenous contrast. CONTRAST:  6 mL GADAVIST IV SOLN COMPARISON:  Plain films right foot 02/28/2019. FINDINGS: Bones/Joint/Cartilage There is marrow edema and enhancement in the distal 1.7 cm of the fifth metatarsal and proximal 0.6 cm of the proximal phalanx of the little toe compatible with osteomyelitis. A focus of marrow edema and enhancement in the medial cuneiform measures 1.8 cm long by 1.2 cm craniocaudal and is also worrisome for osteomyelitis. Bone marrow signal is otherwise unremarkable. Ligaments Intact. Muscles and Tendons Intermediate increased T2 signal within intrinsic musculature the  foot is most consistent with diabetic myopathy. No intramuscular fluid collection is identified. No tendon tear. Soft tissues Subcutaneous edema and enhancement are present about the foot consistent with cellulitis. Skin ulceration on the plantar surface of the foot at the level of the proximal metaphysis of the first metatarsal is identified. Although somewhat difficult to visualize, there appears to be a fluid collection in the plantar soft tissues of the distal foot at the base of the toes which measures 7.5 cm transverse by approximately 0.6 cm long by 0.6 cm craniocaudal. The collection extends from the great toe to the fourth toe. There is a small fluid collection dorsal to the fifth MTP joint and proximal phalanx of the little toe which measures 1.8 cm long by 1 cm transverse by 0.3 cm craniocaudal and is worrisome for abscess. IMPRESSION: Findings consistent with osteomyelitis in the head and neck of the fifth metatarsal and base of the proximal phalanx of the fifth toe. There is a small abscess in the subcutaneous tissues dorsal to the fifth MTP joint and proximal phalanx of the little toe. Edema and enhancement in the medial cuneiform consistent with osteomyelitis. Cellulitis about the foot. Although incompletely visualized, there appears to be an abscess in the plantar soft tissues of the foot just proximal to the toes which extends from the great toe to the fourth toe as described above. Cellulitis. Electronically Signed   By: Inge Rise M.D.   On: 04/13/2019 15:31   Mr Tibia Fibula Right W Wo Contrast  Result Date: 04/13/2019 CLINICAL DATA:  Diabetic patient with a wound on the anterior right lower leg for several months. Pain and swelling. EXAM: MRI OF LOWER RIGHT EXTREMITY WITHOUT AND WITH CONTRAST TECHNIQUE: Multiplanar, multisequence MR imaging of the right lower leg was performed both before and after administration of intravenous contrast. CONTRAST:  6 mL GADAVIST IV SOLN COMPARISON:   None. FINDINGS: Bones/Joint/Cartilage There is no marrow  edema or enhancement to suggest osteomyelitis. No fracture, stress change or worrisome lesion. Ligaments Negative. Muscles and Tendons Intermediate increased T2 signal in musculature of the lower leg is most consistent with diabetic myopathy. No intramuscular fluid collection. No tear or strain. No mass. Soft tissues Intense subcutaneous edema is present diffusely. Irregularity of the skin surface along the anterior aspect of the lower leg is compatible with a skin ulceration. There is underlying contrast enhancement. No abscess. IMPRESSION: Cellulitis of the right lower leg with a skin wound present. Negative for abscess, myositis or osteomyelitis. Electronically Signed   By: Inge Rise M.D.   On: 04/13/2019 15:34    Procedures Procedures (including critical care time)  Medications Ordered in ED Medications  gadobutrol (GADAVIST) 1 MMOL/ML injection 6 mL (6 mLs Intravenous Contrast Given 04/13/19 1453)  doxycycline (VIBRA-TABS) tablet 100 mg (100 mg Oral Given 04/13/19 1648)     Initial Impression / Assessment and Plan / ED Course  I have reviewed the triage vital signs and the nursing notes.  Pertinent labs & imaging results that were available during my care of the patient were reviewed by me and considered in my medical decision making (see chart for details).       MRI imaging reviewed and discussed with Dr. Erlinda Hong or ortho who reviewed the MR findings. Recommended close f/u with Dr. Sharol Given on Mon, starting doxycycline today.   Discussed with pt and mother at bedside who agrees with plan and close f/u.  She has appt with Dr. Dellia Nims on Tuesday, advised to keep this appt also.  Initial cbg low - pt had had no PO intake today prior to arrival.  She was given juice and snack, repeat cbg ordered, unfortunately was not notified of result of 73 and pt was dc'd home by nursing staff.  She was actively eating peanut butter and graham crackers  as she was leaving however.    5:36 PM Call placed to pt.  Currently eating dinner.  Advised her to keep close watch on her cbg's and get rechecked if they continue to be low.  Pt understands plan and will adjust her sliding scale and/or skip dose if needed.   Final Clinical Impressions(s) / ED Diagnoses   Final diagnoses:  Subacute osteomyelitis of right foot ([place])  Cellulitis of right lower extremity  Abscess    ED Discharge Orders         Ordered    doxycycline (VIBRAMYCIN) 100 MG capsule  2 times daily     04/13/19 1616           [name], Hershal Coria 04/13/19 1737    Lucrezia Starch, MD 04/13/19 1910

## 2019-04-13 NOTE — ED Triage Notes (Signed)
Sent by wound Dr for MRI to wound on RT foot.  Wound has been draining.

## 2019-04-15 LAB — AEROBIC CULTURE W GRAM STAIN (SUPERFICIAL SPECIMEN)

## 2019-04-17 ENCOUNTER — Other Ambulatory Visit: Payer: Self-pay

## 2019-04-17 ENCOUNTER — Encounter (HOSPITAL_BASED_OUTPATIENT_CLINIC_OR_DEPARTMENT_OTHER): Payer: PRIVATE HEALTH INSURANCE | Admitting: Internal Medicine

## 2019-04-17 DIAGNOSIS — E10621 Type 1 diabetes mellitus with foot ulcer: Secondary | ICD-10-CM | POA: Diagnosis not present

## 2019-04-17 NOTE — Progress Notes (Signed)
Lindsey Murillo, Lindsey Murillo ([id number]) Visit Report for 03/20/2019 Allergy List Details Patient Name: Date of Service: Lindsey Murillo, Lindsey Murillo 03/20/2019 10:30 AM Medical Record [id number] Patient Account Number: [id number] Date of Birth/Sex: Treating RN: 1995-12-03 (23 y.o. Lindsey Murillo Primary Care Donie Lemelin: Baltazar Apo Other Clinician: Referring Mansur Patti: Treating Shai Mckenzie/Extender:Robson, Evlyn Kanner, [place] Dom in Treatment: 0 Allergies Active Allergies No Known Allergies Allergy Notes Electronic Signature(s) Signed: 03/20/2019 2:49:59 PM By: Baruch Gouty RN, BSN Entered By: Baruch Gouty on 03/20/2019 10:41:30 -------------------------------------------------------------------------------- Arrival Information Details Patient Name: Date of Service: Lindsey Hew. 03/20/2019 10:30 AM Medical Record [id number] Patient Account Number: [id number] Date of Birth/Sex: Treating RN: [birth year] (23 y.o. Lindsey Murillo Primary Care Kanyla Omeara: Baltazar Apo Other Clinician: Referring Bodhi Moradi: Treating Mahaila Tischer/Extender:Robson, Evlyn Kanner, [place] Dom in Treatment: 0 Visit Information Patient Arrived: Ambulatory Arrival Time: 10:33 Accompanied By: mother Transfer Assistance: None Patient Identification Verified: Yes Secondary Verification Process Completed: Yes Patient Requires Transmission-Based No Precautions: Patient Has Alerts: No History Since Last Visit Electronic Signature(s) Signed: 03/20/2019 2:49:59 PM By: Baruch Gouty RN, BSN Entered By: Baruch Gouty on 03/20/2019 10:39:00 -------------------------------------------------------------------------------- Clinic Level of Care Assessment Details Patient Name: Date of Service: Lindsey Murillo 03/20/2019 10:30 AM Medical Record [id number] Patient Account Number: [id number] Date of Birth/Sex: Treating RN: Jan 31, 1996 (23 y.o. Orvan Falconer Primary Care  Merwyn Hodapp: Baltazar Apo Other Clinician: Referring Dany Harten: Treating Jennfier Abdulla/Extender:Robson, Evlyn Kanner, [place] Dom in Treatment: 0 Clinic Level of Care Assessment Items TOOL 2 Quantity Score X - Use when only an EandM is performed on the INITIAL visit 1 0 ASSESSMENTS - Nursing Assessment / Reassessment X - General Physical Exam (combine w/ comprehensive assessment (listed just below) 1 20 when performed on new pt. evals) X - Comprehensive Assessment (HX, ROS, Risk Assessments, Wounds Hx, etc.) 1 25 ASSESSMENTS - Wound and Skin Assessment / Reassessment []  - Simple Wound Assessment / Reassessment - one wound 0 X - Complex Wound Assessment / Reassessment - multiple wounds 5 5 []  - Dermatologic / Skin Assessment (not related to wound area) 0 ASSESSMENTS - Ostomy and/or Continence Assessment and Care []  - Incontinence Assessment and Management 0 []  - Ostomy Care Assessment and Management (repouching, etc.) 0 PROCESS - Coordination of Care X - Simple Patient / Family Education for ongoing care 1 15 []  - Complex (extensive) Patient / Family Education for ongoing care 0 X - Staff obtains Consents, Records, Test Results / Process Orders 1 10 []  - Staff telephones HHA, Nursing Homes / Clarify orders / etc 0 []  - Routine Transfer to another Facility (non-emergent condition) 0 []  - Routine Hospital Admission (non-emergent condition) 0 X - New Admissions / Biomedical engineer / Ordering NPWT, Apligraf, etc. 1 15 []  - Emergency Hospital Admission (emergent condition) 0 X - Simple Discharge Coordination 1 10 []  - Complex (extensive) Discharge Coordination 0 PROCESS - Special Needs []  - Pediatric / Minor Patient Management 0 []  - Isolation Patient Management 0 []  - Hearing / Language / Visual special needs 0 []  - Assessment of Community assistance (transportation, D/C planning, etc.) 0 []  - Additional assistance / Altered mentation 0 []  - Support Surface(s) Assessment (bed,  cushion, seat, etc.) 0 INTERVENTIONS - Wound Cleansing / Measurement X - Wound Imaging (photographs - any number of wounds) 1 5 []  - Wound Tracing (instead of photographs) 0 []  - Simple Wound Measurement - one wound 0 X - Complex Wound Measurement - multiple wounds 5 5 []  - Simple Wound Cleansing - one wound 0 X - Complex  Wound Cleansing - multiple wounds 5 5 INTERVENTIONS - Wound Dressings []  - Small Wound Dressing one or multiple wounds 0 X - Medium Wound Dressing one or multiple wounds 1 15 []  - Large Wound Dressing one or multiple wounds 0 []  - Application of Medications - injection 0 INTERVENTIONS - Miscellaneous []  - External ear exam 0 []  - Specimen Collection (cultures, biopsies, blood, body fluids, etc.) 0 []  - Specimen(s) / Culture(s) sent or taken to Lab for analysis 0 []  - Patient Transfer (multiple staff / Harrel Lemon Lift / Similar devices) 0 []  - Simple Staple / Suture removal (25 or less) 0 []  - Complex Staple / Suture removal (26 or more) 0 []  - Hypo / Hyperglycemic Management (close monitor of Blood Glucose) 0 []  - Ankle / Brachial Index (ABI) - do not check if billed separately 0 Has the patient been seen at the hospital within the last three years: Yes Total Score: 190 Level Of Care: New/Established - Level 5 Electronic Signature(s) Signed: 04/17/2019 2:53:59 PM By: Carlene Coria RN Entered By: Carlene Coria on 03/20/2019 13:17:49 -------------------------------------------------------------------------------- Lower Extremity Assessment Details Patient Name: Date of Service: Lindsey Murillo, Lindsey Murillo 03/20/2019 10:30 AM Medical Record [id number] Patient Account Number: [id number] Date of Birth/Sex: Treating RN: 12-17-1995 (23 y.o. Lindsey Murillo Primary Care Yuka Lallier: Baltazar Apo Other Clinician: Referring Weslie Rasmus: Treating Josias Tomerlin/Extender:Robson, Evlyn Kanner, [place] Dom in Treatment: 0 Edema Assessment Assessed: [Left: No] [Right: No] Edema:  [Left: Yes] [Right: Yes] Calf Left: Right: Point of Measurement: cm From Medial Instep 38 cm 38.5 cm Ankle Left: Right: Point of Measurement: cm From Medial Instep 28 cm 30.5 cm Vascular Assessment Pulses: Dorsalis Pedis Palpable: [Left:Yes] [Right:Yes] Electronic Signature(s) Signed: 03/20/2019 2:49:59 PM By: Baruch Gouty RN, BSN Entered By: Baruch Gouty on 03/20/2019 10:58:06 -------------------------------------------------------------------------------- Multi Wound Chart Details Patient Name: Date of Service: Lindsey Hew. 03/20/2019 10:30 AM Medical Record [id number] Patient Account Number: [id number] Date of Birth/Sex: Treating RN: 1995-10-09 (23 y.o. 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 Extremity Lower Extremity Secondary Etiology: N/A N/A N/A Comorbid History: Anemia, Type I Diabetes Anemia, Type I Diabetes Anemia, Type I Diabetes Date Acquired: 01/09/2019 01/09/2019 01/09/2019 Weeks of Treatment: 0 0 0 Wound Status: Open Open Open Measurements L x W x D 2x3.5x0.2 0.3x0.5x0.2 0.6x0.3x0.8 (cm) Area (cm) : 5.498 0.118 0.141 Volume (cm) : 1.1 0.024 0.113 % Reduction in Area: 0.00% 0.00% 0.00% % Reduction in Volume: 0.00% 0.00% 0.00% Starting Position 1 7 (o'clock): Ending Position 1  8 (o'clock): Maximum Distance 1 1.4 (cm): Undermining: No No Yes Classification: Grade 1 Grade 1 Grade 1 Exudate Amount: Large Small Medium Exudate Type: Serosanguineous Serous Serous Exudate Color: red, brown amber amber Foul Odor After Cleansing:Yes No Yes Odor Anticipated Due to No N/A No Product Use: Wound Margin: Flat and Intact Flat and Intact Thickened Granulation Amount: Large (67-100%) Large (67-100%) Large (67-100%) Granulation Quality: Red Red Red Necrotic Amount: Small (1-33%) None Present (0%) None Present (0%) Exposed Structures: Fat Layer (Subcutaneous Fat Layer (Subcutaneous Fat Layer (Subcutaneous Tissue) Exposed: Yes  Tissue) Exposed: Yes Tissue) Exposed: Yes Fascia: No Fascia: No Fascia: No Tendon: No Tendon: No Tendon: No Muscle: No Muscle: No Muscle: No Joint: No Joint: No Joint: No Bone: No Bone: No Bone: No Epithelialization: Small (1-33%) Small (1-33%) N/A Assessment Notes: macerated wound margins macerated wound margins N/A Wound Number: 7 8 N/A Photos: N/A Wound Location: Right Foot - Plantar, Right Lower Leg - Anterior N/A Proximal Wounding Event: Gradually Appeared Blister N/A Primary Etiology: Diabetic Wound/Ulcer of the Diabetic Wound/Ulcer of the N/A Lower Extremity Lower Extremity Secondary Etiology: N/A Lymphedema N/A Comorbid History: Anemia, Type I Diabetes Anemia, Type I Diabetes N/A Date Acquired: 01/09/2019 03/18/2019 N/A Weeks of Treatment: 0 0 N/A Wound Status: Open Open N/A Measurements L x W x D 0.4x1.4x0.7 2.4x3.1x0.1 N/A (cm) Area (cm) : 0.44 5.843 N/A Volume (cm) : 0.308 0.584 N/A % Reduction in Area: 0.00% 0.00% N/A % Reduction in Volume: 0.00% 0.00% N/A Starting Position 1 10 (o'clock): Ending Position 1 11 (o'clock): Maximum Distance 1 4.5 (cm): Undermining: Yes No N/A Classification: Grade 2 Grade 1 N/A Exudate Amount: Large Small N/A Exudate Type: Serous Serous N/A Exudate Color: amber amber N/A Foul  Odor After Cleansing:Yes No N/A Odor Anticipated Due to No N/A N/A Product Use: Wound Margin: Distinct, outline attached Flat and Intact N/A Granulation Amount: Large (67-100%) None Present (0%) N/A Granulation Quality: Red N/A N/A Necrotic Amount: None Present (0%) None Present (0%) N/A Exposed Structures: Fat Layer (Subcutaneous Fascia: No N/A Tissue) Exposed: Yes Fat Layer (Subcutaneous Fascia: No Tissue) Exposed: No Tendon: No Tendon: No Muscle: No Muscle: No Joint: No Joint: No Bone: No Bone: No Limited to Skin Breakdown Epithelialization: N/A Small (1-33%) N/A Assessment Notes: macerated wound margins N/A N/A Treatment Notes Electronic Signature(s) Signed: 04/10/2019 6:45:39 PM By: Linton Ham MD Signed: 04/17/2019 2:53:59 PM By: Carlene Coria RN Previous Signature: 03/20/2019 5:17:49 PM Version By: Linton Ham MD Entered By: Linton Ham on 04/10/2019 18:09:22 -------------------------------------------------------------------------------- Multi-Disciplinary Care Plan Details Patient Name: Date of Service: Lindsey Hew. 03/20/2019 10:30 AM Medical Record [id number] Patient Account Number: [id number] Date of Birth/Sex: Treating RN: 10/21/1995 (23 y.o. Orvan Falconer Primary Care Eryck Negron: Baltazar Apo Other Clinician: Referring Mililani Murthy: Treating Elizebath Wever/Extender:Robson, Evlyn Kanner, [place] Dom in Treatment: 0 Active Inactive Wound/Skin Impairment Nursing Diagnoses: Knowledge deficit related to ulceration/compromised skin integrity Goals: Patient/caregiver will verbalize understanding of skin care regimen Date Initiated: 03/20/2019 Target Resolution Date: 04/20/2019 Goal Status: Active Ulcer/skin breakdown will have a volume reduction of 30% by week 4 Date Initiated: 03/20/2019 Target Resolution Date: 04/20/2019 Goal Status: Active Interventions: Assess patient/caregiver ability to obtain necessary supplies Assess  patient/caregiver ability to perform ulcer/skin care regimen upon admission and as needed Assess ulceration(s) every visit Notes: Electronic Signature(s) Signed: 04/17/2019 2:53:59 PM By: Carlene Coria RN Entered By: Carlene Coria on 03/20/2019 12:04:24 -------------------------------------------------------------------------------- Pain Assessment Details Patient Name: Lindsey Castle R. Date of Service: 03/20/2019 10:30 AM Medical Record [id number] Patient Account Number: [id number] Date of Birth/Sex: Treating RN: [birth year] (23 y.o. Lindsey Murillo Primary Care Jovane Foutz: Baltazar Apo Other Clinician: Referring Adreanna Fickel: Treating Zerenity Bowron/Extender:Robson, Evlyn Kanner, [place] Dom in Treatment: 0 Active Problems Location of Pain Severity and Description of Pain Patient Has Paino No Site Locations Rate the pain. Current Pain Level: 0 Character of Pain Describe the Pain: Tender Pain Management and Medication Current Pain Management: Electronic Signature(s) Signed: 03/20/2019 2:49:59 PM By: Baruch Gouty RN, BSN Entered By: Baruch Gouty on 03/20/2019 11:12:42 -------------------------------------------------------------------------------- Patient/Caregiver Education Details Naff, Jesilyn 11/10/2020andnbsp10:30 Patient Name:  Date of Service: R. AM Medical Record Patient Account Number: [id number] Number: Treating RN: Carlene Coria Date of Birth/Gender: 11-19-1995 (23 y.o. F) Other Clinician: Primary Care Treating Baird Kay Physician: Physician/Extender: Referring Physician: Marylin Crosby in Treatment: 0 Education Assessment Education Provided To: Patient Education Topics Provided Wound/Skin Impairment: Methods: Explain/Verbal Responses: State content correctly Electronic Signature(s) Signed: 04/17/2019 2:53:59 PM By: Carlene Coria RN Entered By: Carlene Coria on 03/20/2019  12:05:08 -------------------------------------------------------------------------------- Wound Assessment Details Patient Name: Date of Service: Lindsey Murillo, Lindsey Murillo 03/20/2019 10:30 AM Medical Record [id number] Patient Account Number: [id number] Date of Birth/Sex: Treating RN: 1996/01/02 (23 y.o. Lindsey Murillo Primary Care Pattrick Bady: Baltazar Apo Other Clinician: Referring Kaitlynne Wenz: Treating Luxe Cuadros/Extender:Robson, Evlyn Kanner, [place] Dom in Treatment: 0 Wound Status Wound Number: 4 Primary Diabetic Wound/Ulcer of the Lower Etiology: Extremity Wound Location: Right Foot - Plantar, Distal Wound Status: Open Wounding Event: Gradually Appeared Comorbid Anemia, Type I Diabetes Date Acquired: 01/09/2019 History: Weeks Of Treatment: 0 Clustered Wound: No Photos Wound Measurements Length: (cm) 2 Width: (cm) 3.5 Depth: (cm) 0.2 Area: (cm) 5.498 Volume: (cm) 1.1 Wound Description Classification: Grade 1 Wound Margin: Flat and Intact Exudate Amount: Large Exudate Type: Serosanguineous Exudate Color: red, brown Wound Bed Granulation Amount: Large (67-100%) Granulation Quality: Red Necrotic Amount: Small (1-33%) Necrotic Quality: Adherent Slough r After Cleansing: Yes roduct Use: No ibrino No Exposed Structure posed: No (Subcutaneous Tissue) Exposed: Yes posed: No posed: No osed: No sed: No % Reduction in Area: 0% % Reduction in Volume: 0% Epithelialization: Small (1-33%) Tunneling: No Undermining: No Foul Odo Due to P Slough/F Fascia Ex Fat Layer Tendon Ex Muscle Ex Joint Exp Bone Expo Assessment Notes macerated wound margins Electronic Signature(s) Signed: 03/22/2019 3:27:21 PM By: Mikeal Hawthorne EMT/HBOT Signed: 03/22/2019 5:16:39 PM By: Baruch Gouty RN, BSN Previous Signature: 03/20/2019 2:49:59 PM Version By: Baruch Gouty RN, BSN Entered By: Mikeal Hawthorne on 03/22/2019  08:48:07 -------------------------------------------------------------------------------- Wound Assessment Details Patient Name: Date of Service: Lindsey Castle R. 03/20/2019 10:30 AM Medical Record [id number] Patient Account Number: [id number] Date of Birth/Sex: Treating RN: 1995-06-17 (23 y.o. Lindsey Murillo Primary Care Crayton Savarese: Baltazar Apo Other Clinician: Referring Frederich Montilla: Treating Rhilyn Battle/Extender:Robson, Evlyn Kanner, [place] Dom in Treatment: 0 Wound Status Wound Number: 5 Primary Diabetic Wound/Ulcer of the Lower Etiology: Extremity Wound Location: Right Metatarsal head first Wound Status: Open Wounding Event: Gradually Appeared Comorbid Anemia, Type I Diabetes Date Acquired: 01/09/2019 History: Weeks Of Treatment: 0 Clustered Wound: No Photos Wound Measurements Length: (cm) 0.3 Width: (cm) 0.5 Depth: (cm) 0.2 Area: (cm) 0.118 Volume: (cm) 0.024 Wound Description Classification: Grade 1 Wound Margin: Flat and Intact Exudate Amount: Small Exudate Type: Serous Exudate Color: amber Wound Bed Granulation Amount: Large (67-100%) Granulation Quality: Red Necrotic Amount: None Present (0%) After Cleansing: No brino No Exposed Structure posed: No (Subcutaneous Tissue) Exposed: Yes posed: No posed: No osed: No sed: No % Reduction in Area: 0% % Reduction in Volume: 0% Epithelialization: Small (1-33%) Tunneling: No Undermining: No Foul Odor Slough/Fi Fascia Ex Fat Layer Tendon Ex Muscle Ex Joint Exp Bone Expo Assessment Notes macerated wound margins Electronic Signature(s) Signed: 03/22/2019 3:27:21 PM By: Mikeal Hawthorne EMT/HBOT Signed: 03/22/2019 5:16:39 PM By: Baruch Gouty RN, BSN Previous Signature: 03/20/2019 2:49:59 PM Version By: Baruch Gouty RN, BSN Entered By: Mikeal Hawthorne on 03/22/2019 08:51:40 -------------------------------------------------------------------------------- Wound Assessment  Details Patient Name: Date of Service: Lindsey Castle R. 03/20/2019 10:30 AM Medical Record [id number] Patient Account Number: [id number] Date of Birth/Sex: Treating RN: 01-23-1996 (23 y.o.  Lindsey Murillo Primary Care Aeric Burnham: Baltazar Apo Other Clinician: Referring Rayder Sullenger: Treating Macklin Jacquin/Extender:Robson, Evlyn Kanner, [place] Dom in Treatment: 0 Wound Status Wound Number: 6 Primary Diabetic Wound/Ulcer of the Lower Etiology: Extremity Wound Location: Right Metatarsal head second Wound Status: Open Wounding Event: Gradually Appeared Comorbid Anemia, Type I Diabetes Date Acquired: 01/09/2019 History: Weeks Of Treatment: 0 Clustered Wound: No Photos Wound Measurements Length: (cm) 0.6 % Reductio Width: (cm) 0.3 % Reductio Depth: (cm) 0.8 Tunneling: Area: (cm) 0.141 Undermini Volume: (cm) 0.113 Starti Ending Maximum n in Area: 0% n in Volume: 0% No ng: Yes ng Position (o'clock): 7 Position (o'clock): 8 Distance: (cm) 1.4 Wound Description Classification: Grade 1 Foul Odor Wound Margin: Thickened Due to Pr Exudate Amount: Medium Slough/Fi Exudate Type: Serous Exudate Color: amber Wound Bed Granulation Amount: Large (67-100%) Granulation Quality: Red Fascia Ex Necrotic Amount: None Present (0%) Fat Layer Tendon Ex Muscle Ex Joint Exp Bone Expo After Cleansing: Yes oduct Use: No brino No Exposed Structure posed: No (Subcutaneous Tissue) Exposed: Yes posed: No posed: No osed: No sed: No Electronic Signature(s) Signed: 03/22/2019 3:27:21 PM By: Mikeal Hawthorne EMT/HBOT Signed: 03/22/2019 5:16:39 PM By: Baruch Gouty RN, BSN Previous Signature: 03/20/2019 2:49:59 PM Version By: Baruch Gouty RN, BSN Entered By: Mikeal Hawthorne on 03/22/2019 08:53:59 -------------------------------------------------------------------------------- Wound Assessment Details Patient Name: Date of Service: Lindsey Castle R. 03/20/2019 10:30  AM Medical Record [id number] Patient Account Number: [id number] Date of Birth/Sex: Treating RN: 1995-10-06 (23 y.o. Lindsey Murillo Primary Care Latona Krichbaum: Baltazar Apo Other Clinician: Referring Amandamarie Feggins: Treating Gerik Coberly/Extender:Robson, Evlyn Kanner, [place] Dom in Treatment: 0 Wound Status Wound Number: 7 Primary Diabetic Wound/Ulcer of the Lower Etiology: Extremity Wound Location: Right Foot - Plantar, Proximal Wound Status: Open Wounding Event: Gradually Appeared Comorbid Anemia, Type I Diabetes Date Acquired: 01/09/2019 History: Weeks Of Treatment: 0 Clustered Wound: No Photos Wound Measurements Length: (cm) 0.4 Width: (cm) 1.4 Depth: (cm) 0.7 Area: (cm) 0.44 Volume: (cm) 0.308 % Reduction in Area: 0% % Reduction in Volume: 0% Tunneling: No Undermining: Yes Starting Position (o'clock): 10 Ending Position (o'clock): 11 Maximum Distance: (cm) 4.5 Wound Description Classification: Grade 2 Wound Margin: Distinct, outline attached Exudate Amount: Large Exudate Type: Serous Exudate Color: amber Wound Bed Granulation Amount: Large (67-100%) Granulation Quality: Red Necrotic Amount: None Present (0%) Foul Odor After Cleansing: Yes Due to Product Use: No Slough/Fibrino No Exposed Structure Fascia Exposed: No Fat Layer (Subcutaneous Tissue) Exposed: Yes Tendon Exposed: No Muscle Exposed: No Joint Exposed: No Bone Exposed: No Assessment Notes macerated wound margins Electronic Signature(s) Signed: 03/22/2019 3:27:21 PM By: Mikeal Hawthorne EMT/HBOT Signed: 03/22/2019 5:16:39 PM By: Baruch Gouty RN, BSN Previous Signature: 03/20/2019 2:49:59 PM Version By: Baruch Gouty RN, BSN Entered By: Mikeal Hawthorne on 03/22/2019 08:54:21 -------------------------------------------------------------------------------- Wound Assessment Details Patient Name: Date of Service: Lindsey Castle R. 03/20/2019 10:30 AM Medical Record [id number]  Patient Account Number: [id number] Date of Birth/Sex: Treating RN: 11-01-1995 (23 y.o. Martyn Malay, Linda Primary Care Kain Milosevic: Baltazar Apo Other Clinician: Referring Maevyn Riordan: Treating Loralee Weitzman/Extender:Robson, Evlyn Kanner, [place] Dom in Treatment: 0 Wound Status Wound Number: 8 Primary Etiology: Diabetic Wound/Ulcer of the Lower Extremity Wound Location: Right Lower Leg - Anterior Secondary Lymphedema Wounding Event: Blister Etiology: Date Acquired: 03/18/2019 Wound Status: Open Weeks Of Treatment: 0 Comorbid Anemia, Type I Diabetes Clustered Wound: No History: Photos Wound Measurements Length: (cm) 2.4 Width: (cm) 3.1 Depth: (cm) 0.1 Area: (cm) 5.843 Volume: (cm) 0.584 Wound Description Classification: Grade 1 Wound Margin: Flat and Intact Exudate Amount: Small Exudate Type:  Serous Exudate Color: amber Wound Bed Granulation Amount: None Present (0%) Necrotic Amount: None Present (0%) After Cleansing: No brino No Exposed Structure posed: No (Subcutaneous Tissue) Exposed: No posed: No posed: No osed: No sed: No o Skin Breakdown % Reduction in Area: 0% % Reduction in Volume: 0% Epithelialization: Small (1-33%) Tunneling: No Undermining: No Foul Odor Slough/Fi Fascia Ex Fat Layer Tendon Ex Muscle Ex Joint Exp Bone Expo Limited t Electronic Signature(s) Signed: 03/22/2019 3:27:21 PM By: Mikeal Hawthorne EMT/HBOT Signed: 03/22/2019 5:16:39 PM By: Baruch Gouty RN, BSN Previous Signature: 03/20/2019 2:49:59 PM Version By: Baruch Gouty RN, BSN Entered By: Mikeal Hawthorne on 03/22/2019 08:54:47 -------------------------------------------------------------------------------- Vitals Details Patient Name: Date of Service: Lindsey Castle R. 03/20/2019 10:30 AM Medical Record [id number] Patient Account Number: [id number] Date of Birth/Sex: Treating RN: April 09, 1996 (23 y.o. Lindsey Murillo Primary Care Francene Mcerlean: Baltazar Apo Other Clinician: Referring Rocio Wolak: Treating Alfie Alderfer/Extender:Robson, Evlyn Kanner, [place] Dom in Treatment: 0 Vital Signs Time Taken: 10:39 Temperature (F): 98.2 Height (in): 68 Pulse (bpm): 115 Source: Stated Respiratory Rate (breaths/min): 18 Weight (lbs): 137 Blood Pressure (mmHg): 130/89 Source: Stated Capillary Blood Glucose (mg/dl): 145 Body Mass Index (BMI): 20.8 Reference Range: 80 - 120 mg / dl Notes glucose per pt report yesterday Electronic Signature(s) Signed: 03/20/2019 2:49:59 PM By: Baruch Gouty RN, BSN Entered By: Baruch Gouty on 03/20/2019 10:40:38

## 2019-04-17 NOTE — Progress Notes (Signed)
Lindsey Murillo, Lindsey Murillo ([id number]) Visit Report for 04/10/2019 Debridement Details Patient Name: Lindsey Murillo, Lindsey Murillo. Date of Service: 04/10/2019 3:45 PM Medical Record [id number] Patient Account Number: [id number] Date of Birth/Sex: Treating RN: [birth year] (23 y.o. Lindsey Murillo Primary Care Provider: Baltazar Apo Other Clinician: Referring Provider: Treating Provider/Extender:Shayleigh Bouldin, Evlyn Kanner, [place] Dom in Treatment: 3 Debridement Performed for Wound #8 Right,Anterior Lower Leg Assessment: Performed By: Physician Ricard Dillon., MD Debridement Type: Debridement Severity of Tissue Pre Fat layer exposed Debridement: Level of Consciousness (Pre- Awake and Alert procedure): Pre-procedure Verification/Time Out Taken: Yes - 17:59 Start Time: 17:59 Pain Control: Lidocaine 5% topical ointment Total Area Debrided (L x W): 4 (cm) x 4 (cm) = 16 (cm) Tissue and other material Viable, Non-Viable, Slough, Subcutaneous, Slough debrided: Level: Skin/Subcutaneous Tissue Debridement Description: Excisional Instrument: Curette Bleeding: Moderate Hemostasis Achieved: Pressure End Time: 18:03 Procedural Pain: 0 Post Procedural Pain: 0 Response to Treatment: Procedure was tolerated well Level of Consciousness Awake and Alert (Post-procedure): Post Debridement Measurements of Total Wound Length: (cm) 4 Width: (cm) 4 Depth: (cm) 0.2 Volume: (cm) 2.513 Character of Wound/Ulcer Post Improved Debridement: Severity of Tissue Post Debridement: Fat layer exposed Post Procedure Diagnosis Same as Pre-procedure Electronic Signature(s) Signed: 04/10/2019 6:45:39 PM By: Linton Ham MD Signed: 04/17/2019 2:56:39 PM By: Carlene Coria RN Entered By: Carlene Coria on 04/10/2019 18:20:52 -------------------------------------------------------------------------------- HPI Details Patient Name: Date of Service: Lindsey Murillo 04/10/2019 3:45 PM Medical Record  [id number] Patient Account Number: [id number] Date of Birth/Sex: Treating RN: [birth year] (23 y.o. F) Primary Care Provider: Baltazar Apo Other Clinician: Referring Provider: Treating Provider/Extender:Arrick Dutton, Evlyn Kanner, [place] Dom in Treatment: 3 History of Present Illness HPI Description: 06/16/2018 ADMISSION This is a 23 year old type I diabetic patient. She was referred by her family physician Dr. Wolfgang Phoenix in [place] where they live. She had been seen in the urgent care at [facility] on 05/27/2018 with a large bulla on the right dorsal foot. An x-ray of the foot showed swelling of the base of her first toe and first MTP with no soft tissue gas or bony erosion. She came to the primary physician's attention in follow-up on 06/09/2018 noted to have a diabetic foot ulcer. She was advised to take work off. She has been using peroxide and Neosporin to the wound. Past medical history; the patient does not have a prior history of diabetic foot ulcers. She is listed as having both peripheral neuropathy and autonomic neuropathy. Chronic diarrhea thoracic back pain and anemia. ABIs in our clinic were noncompressible on the right 2/11; follow-up for this badly infected wound that we admitted to the clinic on 2/7. The area in question is on the right first metatarsal head. When she arrived in clinic last week there was swelling and erythema in the foot as well as on the lateral part of the first metatarsal head. Plain x-rays showed soft tissue wound with soft tissue gas noted overlying the first MTP no radiographic changes of osteomyelitis. The culture showed abundant group B strep. The Augmentin that I chose empirically last time clearly the right choice. She is offloading this in a healing sandal. She is not working. She states her diabetes is under reasonably good control 2/14; continued follow-up. The area in question which is on the right first metatarsal head looks improved  although still requiring debridement were using silver alginate. She is completing 10 days of Augmentin which she has tolerated well. According to the patient she is offloading this quite aggressively. She is still in  a surgical shoe 2/21; continued follow-up. Right first metatarsal head generally improved but still requiring debridement there is less swelling and no erythema. She is offloading this in a surgical shoe. 2/28; right first metatarsal head generally improved but still requiring debridement we have been using Hydrofera Blue she is using a surgical shoe but I changed her to a Darco forefoot off loader today. Possible she will ultimately require a total contact cast 3/6 right first metatarsal head generally improved there is less adherent debris and generally washes off nicely with Anasept and gauze. Using Hydrofera Blue and a Darco. We are going to consider putting her in a total contact cast today however we did not have sufficient supplies 3/13; our intake nurse reported serosanguineous drainage which was concerning. The patient denies any systemic symptoms we have been using Hydrofera Blue in today with anticipation of putting on a total contact cast. However we also noted swelling around the wound. 3/20 -Right first metatarsal head wound appears granulating, without areas of necrosis or debris surrounding skin appears good, she is completed or almost completed her Keflex course. We will continue with the alginate and she is doing the offloading with the surgical shoe and felt. 3/27; right first metatarsal head. There is no evidence of infection currently. She has senescent looking wound edges and some debris on the wound surface but in general things look quite a bit better today. We have been using alginate and offloading her with a forefoot off loader. I am not going to consider total contact casting her in this environment until I can be certain with that the clinic will be open  to deal with the obligatory first cast change in the routine changing 4/3; right first metatarsal head. No evidence of infection. The tissue looks healthy although there is some tunneling laterally towards the mid part of the foot. We are using silver alginate, I have changed that the silver collagen today. 4/9 right first metatarsal head. No evidence of infection. Using silver collagen. 4/17 right first metatarsal head the wound is actually larger today and I think deeper. There is no evidence of recurrent infection. 4/20; back for her obligatory first total contact cast change. The wound on her foot actually looks quite good on the right first metatarsal head however she clearly has a cast injury at the superior margin of the cast just at the folded level this was in spite of padding this area. 4/24; wound on the right first metatarsal head looks better. The cast injury wounds on the anterior tibial area also look better. 5/1 wound on the right first metatarsal head is closed over. The cost injury wounds on the anterior tibial are also closed. The patient has type 1 diabetes with polyneuropathy. I do not think she has the financial means for diabetic shoes with custom inserts READMISSION 03/20/2019 This is a patient with type 1 diabetes and polyneuropathy. She was seen earlier this year with a wound on the right first metatarsal head the closed over. We recommended that she offload this in her shoes. I do not believe that was done but in any case her wounds reopened sometime in October. She was seen in the ER on 10/20 with a diabetic foot ulcer. Right foot was swollen and erythematous. She was put on antibiotics although at the time of this dictation I am not exactly sure which one. An x-ray was done that showed no osteo-. She has been soaking her foot in Epson salts and applying dry gauze.  She is still working at [employer]. She has 4 open areas; one at the base of the second through fourth  toes. One at the first met head 1 over the second met head and 1 over the midfoot. The second met head wound probes proximally towards her midfoot and midfoot wound probes superiorly although I could not connect these 2 wounds. There is no purulent drainage. A considerable amount of denuded skin over this area which I think is a remanent of cellulitis although the medial part of her foot is still erythematous there is no tenderness. For the most part I think this looks like treated cellulitis from her antibiotics in October. She has a superficial area of epithelial loss on the right lower mid tibia Past medical history; patient is a type I diabetic with polyneuropathy. She is not felt to have PAD.Marland Kitchen She is unaware of her hemoglobin A1c she is supposed to see an endocrinologist this afternoon. 11/19; MRI is tomorrow. Culture I did last time showed group G strep. This would have been covered by the empiric Augmentin I gave her when I first saw her. She should have 3 more days of this I am going to extend this by another 4 days until we have time to digest the MRI. If she has osteomyelitis she is going to need to see infectious disease for IV antibiotics. She has not been systemically unwell. She is tolerating the Augmentin well 12/1; the patient comes in today stating that her MRI that was supposed to be done on 11/20 was canceled by the hospital. She does not have an appointment. She has completed the Augmentin I gave her for the culture showing group G strep. Paradoxically she has less of an open area on the proximal foot just above her plantar metatarsals the midfoot also seems to be probing less and the left met head is closed. She also has a fairly extensive area on the right mid tibia in the setting of what looks to be lymphedema. According to our records this was present on admission but I do not think I looked at this specifically the last time she was here. Electronic Signature(s) Signed:  04/10/2019 6:45:39 PM By: Linton Ham MD Entered By: Linton Ham on 04/10/2019 18:12:23 -------------------------------------------------------------------------------- Physical Exam Details Patient Name: Date of Service: Lindsey Murillo, Lindsey Murillo 04/10/2019 3:45 PM Medical Record [id number] Patient Account Number: [id number] Date of Birth/Sex: Treating RN: [birth year] (23 y.o. F) Primary Care Provider: Baltazar Apo Other Clinician: Referring Provider: Treating Provider/Extender:Inocente Krach, Evlyn Kanner, [place] Dom in Treatment: 3 Constitutional 160/109 repeated. We are going to send her back to see her primary physician. Pulse regular and within target range for patient.Marland Kitchen Respirations regular, non-labored and within target range.. Temperature is normal and within the target range for the patient.Marland Kitchen Appears in no distress. Eyes Conjunctivae clear. No discharge.no icterus. Respiratory work of breathing is normal. Cardiovascular Pedal pulses are palpable. Psychiatric appears at normal baseline. Notes Wound exam; there is still forefoot swelling and erythema but not much worse than 2 weeks ago. She has a small probing area just beyond the metatarsal heads. There is depth here and still some purulent drainage which I have cultured. The center wound is small but does not tunnel anymore in the area on the lateral fifth met head has closed. She has a large wound covered in a very adherent necrotic eschar on the right mid tibia area. This is in the setting of what looks to be lymphedema. Electronic Signature(s) Signed: 04/10/2019 6:45:39 PM  By: Linton Ham MD Entered By: Linton Ham on 04/10/2019 18:14:42 -------------------------------------------------------------------------------- Physician Orders Details Patient Name: Date of Service: Lindsey Murillo, Lindsey Murillo 04/10/2019 3:45 PM Medical Record [id number] Patient Account Number: [id number] Date of  Birth/Sex: Treating RN: 08-Feb-1996 (23 y.o. 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 Dressing Change Frequency Do not change entire dressing for one week. Wound Cleansing Clean wound with Normal Saline. May shower and wash wound with soap and water. Primary Wound Dressing Wound #4 Right,Distal,Plantar Foot Calcium Alginate with Silver - pack into undermining areas Wound #6 Right Metatarsal head second Calcium Alginate with Silver - pack into undermining areas Wound #7 Right,Proximal,Plantar Foot Calcium Alginate with Silver - pack into undermining areas Wound #8 Right,Anterior Lower Leg Calcium Alginate with Silver - pack into undermining areas Secondary Dressing Dry Gauze ABD pad Drawtex Edema Control 3 Layer Compression System - Right Lower Extremity Avoid standing for long periods of time Elevate legs to the level of the heart or above for 30 minutes daily and/or when sitting, a frequency of: - 3 to 4 times a day throughout the day. Off-Loading Open toe surgical shoe to: - on right foot. Radiology MRI,right lower extremity with and without contrast - non healing wound with purlent drainage, rule out osteomylitis - (ICD10 L97.512 - Non-pressure chronic  ulcer of other part of right foot with fat layer exposed) Electronic Signature(s) Signed: 04/10/2019 6:45:39 PM By: Linton Ham MD Signed: 04/17/2019 2:56:39 PM By: Carlene Coria RN Entered By: Carlene Coria on 04/10/2019 18:25:00 -------------------------------------------------------------------------------- Prescription 04/10/2019 Patient Name: Lindsey Castle R. Provider: Linton Ham MD Date of Birth: 1995/11/17 NPI#: [id number] Sex: F DEA#: [id number] Phone #: [phone] License #: [id number] Patient Address: [place] [address] [place], [place] [address] [place], [place] [address] [phone] Allergies No Known Allergies Provider's Orders MRI,right lower extremity with and without contrast - ICD10: L97.512 - non healing wound with purlent drainage, rule out osteomylitis Signature(s): Date(s): Electronic Signature(s) Signed: 04/10/2019 6:45:39 PM By: Linton Ham MD Signed: 04/17/2019 2:56:39 PM By: Carlene Coria RN Entered By: Carlene Coria on 04/10/2019 18:25:01 --------------------------------------------------------------------------------  Problem List Details Patient Name: Date of Service: Lindsey Murillo 04/10/2019 3:45 PM Medical Record [id number] Patient Account Number: [id number] Date of Birth/Sex: Treating RN: 03/30/1996 (23 y.o. Lindsey Murillo Primary Care Provider: Baltazar Apo Other Clinician: Referring Provider: Treating Provider/Extender:Lajuanda Penick, Evlyn Kanner, [place] Dom in Treatment: 3 Active Problems ICD-10 Evaluated Encounter Code Description Active Date Today Diagnosis E10.621 Type 1 diabetes mellitus with foot ulcer 03/20/2019 No Yes L03.115 Cellulitis of right lower limb 03/20/2019 No Yes E10.42 Type 1 diabetes mellitus with diabetic polyneuropathy 03/20/2019 No Yes L97.518 Non-pressure chronic ulcer of other part of right foot 03/20/2019 No Yes with  other specified severity L97.512 Non-pressure chronic ulcer of other part of right foot 03/20/2019 No Yes with fat layer exposed L97.811  Non-pressure chronic ulcer of other part of right lower 03/20/2019 No Yes leg limited to breakdown of skin I89.0 Lymphedema, not elsewhere classified 03/20/2019 No Yes Inactive Problems Resolved Problems Electronic Signature(s) Signed: 04/10/2019 6:45:39 PM By: Linton Ham MD Entered By: Linton Ham on 04/10/2019 18:10:29 -------------------------------------------------------------------------------- Progress Note Details Patient Name: Date of Service: Lindsey Murillo 04/10/2019 3:45 PM Medical Record [id number] Patient Account Number: [id number] Date of Birth/Sex: Treating RN: 13-Feb-1996 (23 y.o. F) Primary Care Provider: Baltazar Apo Other Clinician: Referring Provider: Treating Provider/Extender:Araiyah Cumpton, Evlyn Kanner, [place] Dom in Treatment: 3 Subjective History of Present Illness (HPI) 06/16/2018 ADMISSION This is a 23 year old type I diabetic patient. She was referred by her family physician Dr. Wolfgang Phoenix in [place] where they live. She had been seen in the urgent care at [facility] on 05/27/2018 with a large bulla on the right dorsal foot. An x-ray of the foot showed swelling of the base of her first toe and first MTP with no soft tissue gas or bony erosion. She came to the primary physician's attention in follow-up on 06/09/2018 noted to have a diabetic foot ulcer. She was advised to take work off. She has been using peroxide and Neosporin to the wound. Past medical history; the patient does not have a prior history of diabetic foot ulcers. She is listed as having both peripheral neuropathy and autonomic neuropathy. Chronic diarrhea thoracic back pain and anemia. ABIs in our clinic were noncompressible on the right 2/11; follow-up for this badly infected wound that we admitted to the clinic on 2/7. The area in  question is on the right first metatarsal head. When she arrived in clinic last week there was swelling and erythema in the foot as well as on the lateral part of the first metatarsal head. Plain x-rays showed soft tissue wound with soft tissue gas noted overlying the first MTP no radiographic changes of osteomyelitis. The culture showed abundant group B strep. The Augmentin that I chose empirically last time clearly the right choice. She is offloading this in a healing sandal. She is not working. She states her diabetes is under reasonably good control 2/14; continued follow-up. The area in question which is on the right first metatarsal head looks improved although still requiring debridement were using silver alginate. She is completing 10 days of Augmentin which she has tolerated well. According to the patient she is offloading this quite aggressively. She is still in a surgical shoe 2/21; continued follow-up. Right first metatarsal head generally improved but still requiring debridement there is less swelling and no erythema. She is offloading this in a surgical shoe. 2/28; right first metatarsal head generally improved but still requiring debridement we have been using Hydrofera Blue she is using a surgical shoe but I changed her to a Darco forefoot off loader today. Possible she will ultimately require a total contact cast 3/6 right first metatarsal head generally improved there is less adherent debris and generally washes off nicely with Anasept and gauze. Using Hydrofera Blue and a Darco. We are going to consider putting her in a total contact cast today however we did not have sufficient supplies 3/13; our intake nurse reported serosanguineous drainage which was concerning. The patient denies any systemic symptoms we have been using Hydrofera Blue in today with anticipation of putting on a total contact cast. However we also noted swelling around the wound. 3/20 -Right first metatarsal  head wound appears granulating, without areas of necrosis or debris surrounding skin appears good, she is completed or  almost completed her Keflex course. We will continue with the alginate and she is doing the offloading with the surgical shoe and felt. 3/27; right first metatarsal head. There is no evidence of infection currently. She has senescent looking wound edges and some debris on the wound surface but in general things look quite a bit better today. We have been using alginate and offloading her with a forefoot off loader. I am not going to consider total contact casting her in this environment until I can be certain with that the clinic will be open to deal with the obligatory first cast change in the routine changing 4/3; right first metatarsal head. No evidence of infection. The tissue looks healthy although there is some tunneling laterally towards the mid part of the foot. We are using silver alginate, I have changed that the silver collagen today. 4/9 right first metatarsal head. No evidence of infection. Using silver collagen. 4/17 right first metatarsal head the wound is actually larger today and I think deeper. There is no evidence of recurrent infection. 4/20; back for her obligatory first total contact cast change. The wound on her foot actually looks quite good on the right first metatarsal head however she clearly has a cast injury at the superior margin of the cast just at the folded level this was in spite of padding this area. 4/24; wound on the right first metatarsal head looks better. The cast injury wounds on the anterior tibial area also look better. 5/1 wound on the right first metatarsal head is closed over. The cost injury wounds on the anterior tibial are also closed. The patient has type 1 diabetes with polyneuropathy. I do not think she has the financial means for diabetic shoes with custom inserts READMISSION 03/20/2019 This is a patient with type 1 diabetes  and polyneuropathy. She was seen earlier this year with a wound on the right first metatarsal head the closed over. We recommended that she offload this in her shoes. I do not believe that was done but in any case her wounds reopened sometime in October. She was seen in the ER on 10/20 with a diabetic foot ulcer. Right foot was swollen and erythematous. She was put on antibiotics although at the time of this dictation I am not exactly sure which one. An x-ray was done that showed no osteo-. She has been soaking her foot in Epson salts and applying dry gauze. She is still working at [employer]. She has 4 open areas; one at the base of the second through fourth toes. One at the first met head 1 over the second met head and 1 over the midfoot. The second met head wound probes proximally towards her midfoot and midfoot wound probes superiorly although I could not connect these 2 wounds. There is no purulent drainage. A considerable amount of denuded skin over this area which I think is a remanent of cellulitis although the medial part of her foot is still erythematous there is no tenderness. For the most part I think this looks like treated cellulitis from her antibiotics in October. She has a superficial area of epithelial loss on the right lower mid tibia Past medical history; patient is a type I diabetic with polyneuropathy. She is not felt to have PAD.Marland Kitchen She is unaware of her hemoglobin A1c she is supposed to see an endocrinologist this afternoon. 11/19; MRI is tomorrow. Culture I did last time showed group G strep. This would have been covered by the empiric Augmentin I  gave her when I first saw her. She should have 3 more days of this I am going to extend this by another 4 days until we have time to digest the MRI. If she has osteomyelitis she is going to need to see infectious disease for IV antibiotics. She has not been systemically unwell. She is tolerating the Augmentin well 12/1; the patient  comes in today stating that her MRI that was supposed to be done on 11/20 was canceled by the hospital. She does not have an appointment. She has completed the Augmentin I gave her for the culture showing group G strep. Paradoxically she has less of an open area on the proximal foot just above her plantar metatarsals the midfoot also seems to be probing less and the left met head is closed. She also has a fairly extensive area on the right mid tibia in the setting of what looks to be lymphedema. According to our records this was present on admission but I do not think I looked at this specifically the last time she was here. Objective Constitutional 160/109 repeated. We are going to send her back to see her primary physician. Pulse regular and within target range for patient.Marland Kitchen Respirations regular, non-labored and within target range.. Temperature is normal and within the target range for the patient.Marland Kitchen Appears in no distress. Vitals Time Taken: 5:08 PM, Height: 68 in, Weight: 137 lbs, BMI: 20.8, Temperature: 98.1 F, Pulse: 115 bpm, Respiratory Rate: 18 breaths/min, Blood Pressure: 168/117 mmHg, Capillary Blood Glucose: 165 mg/dl. General Notes: glucose per pt report Eyes Conjunctivae clear. No discharge.no icterus. Respiratory work of breathing is normal. Cardiovascular Pedal pulses are palpable. Psychiatric appears at normal baseline. General Notes: Wound exam; there is still forefoot swelling and erythema but not much worse than 2 weeks ago. She has a small probing area just beyond the metatarsal heads. There is depth here and still some purulent drainage which I have cultured. The center wound is small but does not tunnel anymore in the area on the lateral fifth met head has closed. ooShe has a large wound covered in a very adherent necrotic eschar on the right mid tibia area. This is in the setting of what looks to be lymphedema. Integumentary (Hair, Skin) Wound #4 status is Open.  Original cause of wound was Gradually Appeared. The wound is located on the [place]. The wound measures 0.8cm length x 1cm width x 2cm depth; 0.628cm^2 area and 1.257cm^3 volume. There is Fat Layer (Subcutaneous Tissue) Exposed exposed. There is no tunneling or undermining noted. There is a large amount of serosanguineous drainage noted. The wound margin is well defined and not attached to the wound base. There is small (1-33%) pink, pale granulation within the wound bed. There is a large (67-100%) amount of necrotic tissue within the wound bed including Adherent Slough. Wound #5 status is Healed - Epithelialized. Original cause of wound was Gradually Appeared. The wound is located on the Right Metatarsal head first. The wound measures 0cm length x 0cm width x 0cm depth; 0cm^2 area and 0cm^3 volume. There is no tunneling or undermining noted. There is a none present amount of drainage noted. The wound margin is flat and intact. There is no granulation within the wound bed. There is no necrotic tissue within the wound bed. Wound #6 status is Open. Original cause of wound was Gradually Appeared. The wound is located on the Right Metatarsal head second. The wound measures 0.2cm length x 0.2cm width x 0.4cm depth;  0.031cm^2 area and 0.013cm^3 volume. There is Fat Layer (Subcutaneous Tissue) Exposed exposed. There is no tunneling or undermining noted. There is a medium amount of serous drainage noted. The wound margin is thickened. There is large (67-100%) pink granulation within the wound bed. There is no necrotic tissue within the wound bed. Wound #7 status is Open. Original cause of wound was Gradually Appeared. The wound is located on the Right,Proximal,Plantar Foot. The wound measures 0.4cm length x 0.7cm width x 0.7cm depth; 0.22cm^2 area and 0.154cm^3 volume. There is Fat Layer (Subcutaneous Tissue) Exposed exposed. There is no tunneling noted, however, there is undermining  starting at 3:00 and ending at 7:00 with a maximum distance of 0.9cm. There is a small amount of serous drainage noted. The wound margin is well defined and not attached to the wound base. There is medium (34-66%) pink granulation within the wound bed. There is a medium (34-66%) amount of necrotic tissue within the wound bed including Adherent Slough. Wound #8 status is Open. Original cause of wound was Blister. The wound is located on the Right,Anterior Lower Leg. The wound measures 4cm length x 4cm width x 0.1cm depth; 12.566cm^2 area and 1.257cm^3 volume. The wound is limited to skin breakdown. There is no tunneling or undermining noted. There is a none present amount of drainage noted. The wound margin is flat and intact. There is no granulation within the wound bed. There is a large (67-100%) amount of necrotic tissue within the wound bed including Eschar and Adherent Slough. Wound #9 status is Open. Original cause of wound was Gradually Appeared. The wound is located on the Right Toe Fifth. The wound measures 1cm length x 0.4cm width x 0.1cm depth; 0.314cm^2 area and 0.031cm^3 volume. There is Fat Layer (Subcutaneous Tissue) Exposed exposed. There is no tunneling or undermining noted. There is a small amount of serosanguineous drainage noted. The wound margin is flat and intact. There is large (67-100%) pink granulation within the wound bed. There is no necrotic tissue within the wound bed. 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 There was a Excisional Skin/Subcutaneous Tissue Debridement with a total area of 16 sq cm performed by Ricard Dillon., MD. With the following instrument(s): Curette to remove Viable and Non-Viable tissue/material. Material removed includes Subcutaneous Tissue and Slough and after achieving pain control using Lidocaine 5% topical ointment. No specimens were taken. A time out was conducted at 17:59, prior to the start of the procedure. A Moderate amount of bleeding was controlled with Pressure. The procedure was tolerated well with a pain level of 0 throughout and a pain level of 0 following the procedure. Post Debridement Measurements: 4cm length x 4cm width x 0.2cm depth; 2.513cm^3 volume. Character of Wound/Ulcer Post Debridement is improved. Severity of Tissue Post Debridement is: Fat layer exposed. Post procedure Diagnosis Wound #8: Same as Pre-Procedure Plan Follow-up Appointments: Return Appointment in 1 week. Dressing Change Frequency: Do not change entire dressing for one week.  Wound Cleansing: Clean wound with Normal Saline. May shower and wash wound with soap and water. Primary Wound Dressing: Wound #4 Right,Distal,Plantar Foot: Calcium Alginate with Silver - pack into undermining areas Wound #6 Right Metatarsal head second: Calcium Alginate with Silver - pack into undermining areas Wound #7 Right,Proximal,Plantar Foot: Calcium Alginate with Silver - pack into undermining areas Wound #8 Right,Anterior Lower Leg: Calcium Alginate with Silver - pack into undermining areas Secondary Dressing: Dry Gauze ABD pad Drawtex Edema Control: 3 Layer Compression System - Right Lower Extremity Avoid standing for long periods of time Elevate legs to the level of the heart or above for 30 minutes daily and/or when sitting, a frequency  of: - 3 to 4 times a day throughout the day. Off-Loading: Open toe surgical shoe to: - on right foot. Radiology ordered were: MRI,right lower extremity with and without contrast - non healing wound with purlent drainage, rule out osteomylitis 1. The patient did not get her MRI done she states the hospital canceled it. In response to this we were going to send her to the ER but she did not go because of a 6-hour wait. She missed her appointment last week. 2. Paradoxically some of the wounds on the left foot actually look better and the area on the left fifth met head is closed. She still has purulent drainage coming out of the top part of her second and third metatarsal. I have cultured this 3. The wound on her anterior tibia is something that escape my attention last time. Aggressively debrided she is going to need silver alginate on this under 3 layer compression 4. I have told the patient that if we can follow this plan because of what ever issue insurance, radiology then she is to go to the ER at any pen 5. I have referred her to her primary doctor Dr. Wolfgang Phoenix in [place] for management of her blood pressure 6. She does not have chronic renal insufficiency although I am sure she has very poorly managed diabetes Electronic Signature(s) Signed: 04/13/2019 7:57:15 AM By: Linton Ham MD Signed: 04/16/2019 5:51:44 PM By: Levan Hurst RN, BSN Previous Signature: 04/10/2019 6:45:39 PM Version By: Linton Ham MD Entered By: Levan Hurst on 04/12/2019 09:33:18 -------------------------------------------------------------------------------- SuperBill Details Patient Name: Date of Service: MIDORI, DADO 04/10/2019 Medical Record [id number] Patient Account Number: [id number] Date of Birth/Sex: Treating RN: 04/22/1996 (23 y.o. Lindsey Murillo Primary Care Provider: Baltazar Apo Other Clinician: Referring Provider: Treating Provider/Extender:Fiana Gladu, Evlyn Kanner,  [place] Dom in Treatment: 3 Diagnosis Coding ICD-10 Codes Code Description E10.621 Type 1 diabetes mellitus with foot ulcer L03.115 Cellulitis of right lower limb E10.42 Type 1 diabetes mellitus with diabetic polyneuropathy L97.518 Non-pressure chronic ulcer of other part of right foot with other specified severity L97.512 Non-pressure chronic ulcer of other part of right foot with fat layer exposed L97.811 Non-pressure chronic ulcer of other part of right lower leg limited to breakdown of skin I89.0 Lymphedema, not elsewhere classified Facility Procedures CPT4 Code Description: 32440102 11042 - DEB SUBQ TISSUE 20 SQ CM/< ICD-10 Diagnosis Description L97.811 Non-pressure chronic ulcer of other part of right lower leg skin Modifier: limited to brea Quantity: 1 kdown of Physician Procedures CPT4 Code Description: [id number] - WC PHYS SUBQ TISS 20 SQ CM ICD-10 Diagnosis Description L97.811 Non-pressure chronic ulcer of other part of right lower leg skin Modifier: limited to breakdow Quantity: 1 n of Electronic Signature(s) Signed: 04/10/2019 6:45:39 PM By: Linton Ham MD Signed: 04/17/2019 2:56:39 PM By: Carlene Coria  RN Entered By: Carlene Coria on 04/10/2019 18:27:12

## 2019-04-17 NOTE — Progress Notes (Signed)
ZYKERIAH, MATHIA ([id number]) Visit Report for 04/17/2019 Debridement Details Patient Name: Lindsey Murillo, Lindsey Murillo. Date of Service: 04/17/2019 3:00 PM Medical Record [id number] Patient Account Number: [id number] Date of Birth/Sex: Treating RN: [birth year] (23 y.o. F) Primary Care Provider: Baltazar Murillo Other Clinician: Referring Provider: Treating Provider/Extender:Lindsey Murillo, [place] Dom in Treatment: 4 Debridement Performed for Wound #8 Right,Anterior Lower Leg Assessment: Performed By: Physician Lindsey Murillo., MD Debridement Type: Debridement Severity of Tissue Pre Fat layer exposed Debridement: Level of Consciousness (Pre- Awake and Alert procedure): Pre-procedure Verification/Time Out Taken: Yes - 16:20 Start Time: 16:20 Pain Control: Other : BENZOCAINE Total Area Debrided (L x W): 3.9 (cm) x 3.7 (cm) = 14.43 (cm) Tissue and other material Viable, Non-Viable, Slough, Subcutaneous, Skin: Dermis , Skin: Epidermis, Slough debrided: Level: Skin/Subcutaneous Tissue Debridement Description: Excisional Instrument: Curette Bleeding: Moderate Hemostasis Achieved: Pressure End Time: 16:30 Procedural Pain: 0 Post Procedural Pain: 0 Response to Treatment: Procedure was tolerated well Level of Consciousness Awake and Alert (Post-procedure): Post Debridement Measurements of Total Wound Length: (cm) 3.9 Width: (cm) 3.7 Depth: (cm) 0.1 Volume: (cm) 1.133 Character of Wound/Ulcer Post Improved Debridement: Severity of Tissue Post Debridement: Fat layer exposed Post Procedure Diagnosis Same as Pre-procedure Electronic Signature(s) Signed: 04/17/2019 5:53:37 PM By: Lindsey Ham MD Entered By: Lindsey Murillo on 04/17/2019 16:35:28 -------------------------------------------------------------------------------- HPI Details Patient Name: Date of Service: Lindsey Murillo. 04/17/2019 3:00 PM Medical Record [id number] Patient Account  Number: [id number] Date of Birth/Sex: Treating RN: Lindsey Murillo (23 y.o. F) Primary Care Provider: Baltazar Murillo Other Clinician: Referring Provider: Treating Provider/Extender:Lindsey Murillo, [place] Dom in Treatment: 4 History of Present Illness HPI Description: 06/16/2018 ADMISSION This is a 23 year old type I diabetic patient. She was referred by her family physician Dr. Wolfgang Murillo in [place] where they live. She had been seen in the urgent care at [facility] on 05/27/2018 with a large bulla on the right dorsal foot. An x-ray of the foot showed swelling of the base of her first toe and first MTP with no soft tissue gas or bony erosion. She came to the primary physician's attention in follow-up on 06/09/2018 noted to have a diabetic foot ulcer. She was advised to take work off. She has been using peroxide and Neosporin to the wound. Past medical history; the patient does not have a prior history of diabetic foot ulcers. She is listed as having both peripheral neuropathy and autonomic neuropathy. Chronic diarrhea thoracic back pain and anemia. ABIs in our clinic were noncompressible on the right 2/11; follow-up for this badly infected wound that we admitted to the clinic on 2/7. The area in question is on the right first metatarsal head. When she arrived in clinic last week there was swelling and erythema in the foot as well as on the lateral part of the first metatarsal head. Plain x-rays showed soft tissue wound with soft tissue gas noted overlying the first MTP no radiographic changes of osteomyelitis. The culture showed abundant group B strep. The Augmentin that I chose empirically last time clearly the right choice. She is offloading this in a healing sandal. She is not working. She states her diabetes is under reasonably good control 2/14; continued follow-up. The area in question which is on the right first metatarsal head looks improved although still requiring debridement  were using silver alginate. She is completing 10 days of Augmentin which she has tolerated well. According to the patient she is offloading this quite aggressively. She is still in a surgical shoe 2/21; continued follow-up.  Right first metatarsal head generally improved but still requiring debridement there is less swelling and no erythema. She is offloading this in a surgical shoe. 2/28; right first metatarsal head generally improved but still requiring debridement we have been using Hydrofera Blue she is using a surgical shoe but I changed her to a Darco forefoot off loader today. Possible she will ultimately require a total contact cast 3/6 right first metatarsal head generally improved there is less adherent debris and generally washes off nicely with Anasept and gauze. Using Hydrofera Blue and a Darco. We are going to consider putting her in a total contact cast today however we did not have sufficient supplies 3/13; our intake nurse reported serosanguineous drainage which was concerning. The patient denies any systemic symptoms we have been using Hydrofera Blue in today with anticipation of putting on a total contact cast. However we also noted swelling around the wound. 3/20 -Right first metatarsal head wound appears granulating, without areas of necrosis or debris surrounding skin appears good, she is completed or almost completed her Keflex course. We will continue with the alginate and she is doing the offloading with the surgical shoe and felt. 3/27; right first metatarsal head. There is no evidence of infection currently. She has senescent looking wound edges and some debris on the wound surface but in general things look quite a bit better today. We have been using alginate and offloading her with a forefoot off loader. I am not going to consider total contact casting her in this environment until I can be certain with that the clinic will be open to deal with the obligatory first  cast change in the routine changing 4/3; right first metatarsal head. No evidence of infection. The tissue looks healthy although there is some tunneling laterally towards the mid part of the foot. We are using silver alginate, I have changed that the silver collagen today. 4/9 right first metatarsal head. No evidence of infection. Using silver collagen. 4/17 right first metatarsal head the wound is actually larger today and I think deeper. There is no evidence of recurrent infection. 4/20; back for her obligatory first total contact cast change. The wound on her foot actually looks quite good on the right first metatarsal head however she clearly has a cast injury at the superior margin of the cast just at the folded level this was in spite of padding this area. 4/24; wound on the right first metatarsal head looks better. The cast injury wounds on the anterior tibial area also look better. 5/1 wound on the right first metatarsal head is closed over. The cost injury wounds on the anterior tibial are also closed. The patient has type 1 diabetes with polyneuropathy. I do not think she has the financial means for diabetic shoes with custom inserts READMISSION 03/20/2019 This is a patient with type 1 diabetes and polyneuropathy. She was seen earlier this year with a wound on the right first metatarsal head the closed over. We recommended that she offload this in her shoes. I do not believe that was done but in any case her wounds reopened sometime in October. She was seen in the ER on 10/20 with a diabetic foot ulcer. Right foot was swollen and erythematous. She was put on antibiotics although at the time of this dictation I am not exactly sure which one. An x-ray was done that showed no osteo-. She has been soaking her foot in Epson salts and applying dry gauze. She is still working at [employer].  She has 4 open areas; one at the base of the second through fourth toes. One at the first met head 1  over the second met head and 1 over the midfoot. The second met head wound probes proximally towards her midfoot and midfoot wound probes superiorly although I could not connect these 2 wounds. There is no purulent drainage. A considerable amount of denuded skin over this area which I think is a remanent of cellulitis although the medial part of her foot is still erythematous there is no tenderness. For the most part I think this looks like treated cellulitis from her antibiotics in October. She has a superficial area of epithelial loss on the right lower mid tibia Past medical history; patient is a type I diabetic with polyneuropathy. She is not felt to have PAD.Marland Kitchen She is unaware of her hemoglobin A1c she is supposed to see an endocrinologist this afternoon. 11/19; MRI is tomorrow. Culture I did last time showed group G strep. This would have been covered by the empiric Augmentin I gave her when I first saw her. She should have 3 more days of this I am going to extend this by another 4 days until we have time to digest the MRI. If she has osteomyelitis she is going to need to see infectious disease for IV antibiotics. She has not been systemically unwell. She is tolerating the Augmentin well 12/1; the patient comes in today stating that her MRI that was supposed to be done on 11/20 was canceled by the hospital. She does not have an appointment. She has completed the Augmentin I gave her for the culture showing group G strep. Paradoxically she has less of an open area on the proximal foot just above her plantar metatarsals the midfoot also seems to be probing less and the left met head is closed. She also has a fairly extensive area on the right mid tibia in the setting of what looks to be lymphedema. According to our records this was present on admission but I do not think I looked at this specifically the last time she was here. 12/8; I finally had to send this patient to the ER in order to get  an MRI performed. The MRI showed findings consistent with osteomyelitis in the head and neck of the fifth metatarsal and the base and proximal phalanx of the fifth toe. There is also an abscess in the subcutaneous tissues dorsal to the fifth MTP and proximal phalanx of the little toe. Also noted that there is an abscess in the plantar soft tissues of the foot just proximal to the toes which extends from the great toe to the fourth toe. They did do an MRI of the tib-fib which was negative for underlying bone issues. Culture I did last week showed abundant Serratia and moderate group B strep They did not admit her last week from the ER which to me was surprising. She has purulent drainage coming out of the small wound at roughly the third met head. Erythema and swelling of the forefoot. She continues to have a large wound on the anterior mid tibia on the right Electronic Signature(s) Signed: 04/17/2019 5:53:37 PM By: Lindsey Ham MD Entered By: Lindsey Murillo on 04/17/2019 16:44:47 -------------------------------------------------------------------------------- Physical Exam Details Patient Name: Date of Service: Lindsey Murillo. 04/17/2019 3:00 PM Medical Record [id number] Patient Account Number: [id number] Date of Birth/Sex: Treating RN: [birth year] Mur[birth year] (23 y.o. F) Primary Care Provider: Baltazar Murillo Other Clinician: Referring Provider: Treating Provider/Extender:Lindsey Murillo, Gwyndolyn Saxon  Weeks in Treatment: 4 Constitutional Patient is hypertensive.. Pulse regular and within target range for patient.Marland Kitchen Respirations regular, non-labored and within target range.. Temperature is normal and within the target range for the patient.Marland Kitchen Appears in no distress. Respiratory work of breathing is normal. Cardiovascular Heart rhythm and rate regular, without murmur or gallop.. The pulses are palpable on the right. Notes Wound exam; significant forefoot swelling erythema on the  plantar foot. Small probing wound just beyond the third metatarsal head that extends laterally to the fifth metatarsal head. There is swelling and purulent drainage coming from this area. She has a small wound in the midfoot. This at 1 point connected with the first metatarsal. There is no open wound in the first metatarsal currently She has a large necrotic wound on the right mid tibia area. This required aggressive debridement with a #5 curette. Electronic Signature(s) Signed: 04/17/2019 5:53:37 PM By: Lindsey Ham MD Entered By: Lindsey Murillo on 04/17/2019 16:40:50 -------------------------------------------------------------------------------- Physician Orders Details Patient Name: Date of Service: Lindsey Murillo. 04/17/2019 3:00 PM Medical Record [id number] Patient Account Number: [id number] Date of Birth/Sex: Treating RN: [birth year] (23 y.o. Orvan Falconer Primary Care Provider: Baltazar Murillo Other Clinician: Referring Provider: Treating Provider/Extender:Lindsey Murillo, [place] Dom in Treatment: 4 Verbal / Phone Orders: No Diagnosis Coding ICD-10 Coding Code Description E10.621 Type 1 diabetes mellitus with foot ulcer L03.115 Cellulitis of right lower limb E10.42 Type 1 diabetes mellitus with diabetic polyneuropathy L97.518 Non-pressure chronic ulcer of other part of right foot with other specified severity L97.512 Non-pressure chronic ulcer of other part of right foot with fat layer exposed L97.811 Non-pressure chronic ulcer of other part of right lower leg limited to breakdown of skin I89.0 Lymphedema, not elsewhere classified Follow-up Appointments Return Appointment in 1 week. Dressing Change Frequency Do not change entire dressing for one week. Wound Cleansing Clean wound with Normal Saline. May shower and wash wound with soap and water. Primary Wound Dressing Wound #4 Right,Distal,Plantar Foot Calcium Alginate with Silver Wound #6  Right Metatarsal head second Calcium Alginate with Silver Wound #7 Right,Proximal,Plantar Foot Calcium Alginate with Silver Wound #8 Right,Anterior Lower Leg Iodoflex Secondary Dressing Dry Gauze ABD pad Drawtex Edema Control 3 Layer Compression System - Right Lower Extremity Avoid standing for long periods of time Elevate legs to the level of the heart or above for 30 minutes daily and/or when sitting, a frequency of: - 3 to 4 times a day throughout the day. Off-Loading Open toe surgical shoe to: - on right foot. [employer] ORTHO SURGICAL CONSULT - REF TO DR Due West FOR OSTEOMYLITIS AND ABCESS OF RIGHT FOOT - (ICD10 L97.518 - Non-pressure chronic ulcer of other part of right foot with other specified severity) Patient Medications Allergies: No Known Allergies Notifications Medication Indication Start End cefdinir right foot 04/17/2019 infection DOSE oral 300 mg capsule - 1 capsule oral bid for 10 days (d/c doxy) Electronic Signature(s) Signed: 04/17/2019 4:46:39 PM By: Lindsey Ham MD Entered By: Lindsey Murillo on 04/17/2019 16:46:37 -------------------------------------------------------------------------------- Prescription 04/17/2019 Patient Name: Inda Castle R. Provider: Linton Ham MD Date of Birth: [birth year] NPI#: [id number] Sex: F DEA#: [id number] Phone #: [phone] License #: [id number] Patient Address: [place] [address] [place], [place] [address] [place], [place] [address] [phone] Allergies No Known Allergies Provider's Orders ORTHO SURGICAL CONSULT - ICD10: L97.518 - REF TO DR DUDA FOR OSTEOMYLITIS AND ABCESS OF RIGHT FOOT Signature(s): Date(s): Electronic Signature(s) Signed: 04/17/2019 5:53:37 PM  By: Lindsey Ham MD Entered By: Lindsey Murillo on 04/17/2019 16:46:39 --------------------------------------------------------------------------------  Problem List  Details Patient Name: Date of Service: Lindsey Murillo. 04/17/2019 3:00 PM Medical Record [id number] Patient Account Number: [id number] Date of Birth/Sex: Treating RN: [birth year] (23 y.o. Orvan Falconer Primary Care Provider: Baltazar Murillo Other Clinician: Referring Provider: Treating Provider/Extender:Lindsey Murillo, [place] Dom in Treatment: 4 Active Problems ICD-10 Evaluated Encounter Code Description Active Date Today Diagnosis E10.621 Type 1 diabetes mellitus with foot ulcer 03/20/2019 No Yes M86.471 Chronic osteomyelitis with draining sinus, right ankle 04/17/2019 No Yes and foot E10.42 Type 1 diabetes mellitus with diabetic polyneuropathy 03/20/2019 No Yes L97.518 Non-pressure chronic ulcer of other part of right foot 03/20/2019 No Yes with other specified severity L97.512 Non-pressure chronic ulcer of other part of right foot 03/20/2019 No Yes with fat layer exposed L97.811 Non-pressure chronic ulcer of other part of right lower 03/20/2019 No Yes leg limited to breakdown of skin I89.0 Lymphedema, not elsewhere classified 03/20/2019 No Yes Inactive Problems ICD-10 Code Description Active Date Inactive Date L03.115 Cellulitis of right lower limb 03/20/2019 03/20/2019 Resolved Problems Electronic Signature(s) Signed: 04/17/2019 5:53:37 PM By: Lindsey Ham MD Entered By: Lindsey Murillo on 04/17/2019 16:35:09 -------------------------------------------------------------------------------- Progress Note Details Patient Name: Date of Service: Lindsey Murillo. 04/17/2019 3:00 PM Medical Record [id number] Patient Account Number: [id number] Date of Birth/Sex: Treating RN: Mur[birth year] (23 y.o. F) Primary Care Provider: Baltazar Murillo Other Clinician: Referring Provider: Treating Provider/Extender:Lindsey Murillo, [place] Dom in Treatment: 4 Subjective History of Present Illness (HPI) 06/16/2018 ADMISSION This is a 23 year old  type I diabetic patient. She was referred by her family physician Dr. Wolfgang Murillo in [place] where they live. She had been seen in the urgent care at [facility] on 05/27/2018 with a large bulla on the right dorsal foot. An x-ray of the foot showed swelling of the base of her first toe and first MTP with no soft tissue gas or bony erosion. She came to the primary physician's attention in follow-up on 06/09/2018 noted to have a diabetic foot ulcer. She was advised to take work off. She has been using peroxide and Neosporin to the wound. Past medical history; the patient does not have a prior history of diabetic foot ulcers. She is listed as having both peripheral neuropathy and autonomic neuropathy. Chronic diarrhea thoracic back pain and anemia. ABIs in our clinic were noncompressible on the right 2/11; follow-up for this badly infected wound that we admitted to the clinic on 2/7. The area in question is on the right first metatarsal head. When she arrived in clinic last week there was swelling and erythema in the foot as well as on the lateral part of the first metatarsal head. Plain x-rays showed soft tissue wound with soft tissue gas noted overlying the first MTP no radiographic changes of osteomyelitis. The culture showed abundant group B strep. The Augmentin that I chose empirically last time clearly the right choice. She is offloading this in a healing sandal. She is not working. She states her diabetes is under reasonably good control 2/14; continued follow-up. The area in question which is on the right first metatarsal head looks improved although still requiring debridement were using silver alginate. She is completing 10 days of Augmentin which she has tolerated well. According to the patient she is offloading this quite aggressively. She is still in a surgical shoe 2/21; continued follow-up. Right first metatarsal head generally improved but still requiring debridement there is less swelling  and no erythema. She  is offloading this in a surgical shoe. 2/28; right first metatarsal head generally improved but still requiring debridement we have been using Hydrofera Blue she is using a surgical shoe but I changed her to a Darco forefoot off loader today. Possible she will ultimately require a total contact cast 3/6 right first metatarsal head generally improved there is less adherent debris and generally washes off nicely with Anasept and gauze. Using Hydrofera Blue and a Darco. We are going to consider putting her in a total contact cast today however we did not have sufficient supplies 3/13; our intake nurse reported serosanguineous drainage which was concerning. The patient denies any systemic symptoms we have been using Hydrofera Blue in today with anticipation of putting on a total contact cast. However we also noted swelling around the wound. 3/20 -Right first metatarsal head wound appears granulating, without areas of necrosis or debris surrounding skin appears good, she is completed or almost completed her Keflex course. We will continue with the alginate and she is doing the offloading with the surgical shoe and felt. 3/27; right first metatarsal head. There is no evidence of infection currently. She has senescent looking wound edges and some debris on the wound surface but in general things look quite a bit better today. We have been using alginate and offloading her with a forefoot off loader. I am not going to consider total contact casting her in this environment until I can be certain with that the clinic will be open to deal with the obligatory first cast change in the routine changing 4/3; right first metatarsal head. No evidence of infection. The tissue looks healthy although there is some tunneling laterally towards the mid part of the foot. We are using silver alginate, I have changed that the silver collagen today. 4/9 right first metatarsal head. No evidence of  infection. Using silver collagen. 4/17 right first metatarsal head the wound is actually larger today and I think deeper. There is no evidence of recurrent infection. 4/20; back for her obligatory first total contact cast change. The wound on her foot actually looks quite good on the right first metatarsal head however she clearly has a cast injury at the superior margin of the cast just at the folded level this was in spite of padding this area. 4/24; wound on the right first metatarsal head looks better. The cast injury wounds on the anterior tibial area also look better. 5/1 wound on the right first metatarsal head is closed over. The cost injury wounds on the anterior tibial are also closed. The patient has type 1 diabetes with polyneuropathy. I do not think she has the financial means for diabetic shoes with custom inserts READMISSION 03/20/2019 This is a patient with type 1 diabetes and polyneuropathy. She was seen earlier this year with a wound on the right first metatarsal head the closed over. We recommended that she offload this in her shoes. I do not believe that was done but in any case her wounds reopened sometime in October. She was seen in the ER on 10/20 with a diabetic foot ulcer. Right foot was swollen and erythematous. She was put on antibiotics although at the time of this dictation I am not exactly sure which one. An x-ray was done that showed no osteo-. She has been soaking her foot in Epson salts and applying dry gauze. She is still working at [employer]. She has 4 open areas; one at the base of the second through fourth toes. One at the  first met head 1 over the second met head and 1 over the midfoot. The second met head wound probes proximally towards her midfoot and midfoot wound probes superiorly although I could not connect these 2 wounds. There is no purulent drainage. A considerable amount of denuded skin over this area which I think is a remanent of cellulitis  although the medial part of her foot is still erythematous there is no tenderness. For the most part I think this looks like treated cellulitis from her antibiotics in October. She has a superficial area of epithelial loss on the right lower mid tibia Past medical history; patient is a type I diabetic with polyneuropathy. She is not felt to have PAD.Marland Kitchen She is unaware of her hemoglobin A1c she is supposed to see an endocrinologist this afternoon. 11/19; MRI is tomorrow. Culture I did last time showed group G strep. This would have been covered by the empiric Augmentin I gave her when I first saw her. She should have 3 more days of this I am going to extend this by another 4 days until we have time to digest the MRI. If she has osteomyelitis she is going to need to see infectious disease for IV antibiotics. She has not been systemically unwell. She is tolerating the Augmentin well 12/1; the patient comes in today stating that her MRI that was supposed to be done on 11/20 was canceled by the hospital. She does not have an appointment. She has completed the Augmentin I gave her for the culture showing group G strep. Paradoxically she has less of an open area on the proximal foot just above her plantar metatarsals the midfoot also seems to be probing less and the left met head is closed. She also has a fairly extensive area on the right mid tibia in the setting of what looks to be lymphedema. According to our records this was present on admission but I do not think I looked at this specifically the last time she was here. 12/8; I finally had to send this patient to the ER in order to get an MRI performed. The MRI showed findings consistent with osteomyelitis in the head and neck of the fifth metatarsal and the base and proximal phalanx of the fifth toe. There is also an abscess in the subcutaneous tissues dorsal to the fifth MTP and proximal phalanx of the little toe. Also noted that there is an abscess  in the plantar soft tissues of the foot just proximal to the toes which extends from the great toe to the fourth toe. They did do an MRI of the tib-fib which was negative for underlying bone issues. Culture I did last week showed abundant Serratia and moderate group B strep They did not admit her last week from the ER which to me was surprising. She has purulent drainage coming out of the small wound at roughly the third met head. Erythema and swelling of the forefoot. She continues to have a large wound on the anterior mid tibia on the right Objective Constitutional Patient is hypertensive.. Pulse regular and within target range for patient.Marland Kitchen Respirations regular, non-labored and within target range.. Temperature is normal and within the target range for the patient.Marland Kitchen Appears in no distress. Vitals Time Taken: 3:35 PM, Height: 68 in, Weight: 137 lbs, BMI: 20.8, Temperature: 98.0 F, Pulse: 106 bpm, Respiratory Rate: 16 breaths/min, Blood Pressure: 143/101 mmHg, Capillary Blood Glucose: 100 mg/dl. General Notes: glucose per pt report Respiratory work of breathing is normal. Cardiovascular  Heart rhythm and rate regular, without murmur or gallop.. The pulses are palpable on the right. General Notes: Wound exam; significant forefoot swelling erythema on the plantar foot. Small probing wound just beyond the third metatarsal head that extends laterally to the fifth metatarsal head. There is swelling and purulent drainage coming from this area. She has a small wound in the midfoot. This at 1 point connected with the first metatarsal. There is no open wound in the first metatarsal currently ooShe has a large necrotic wound on the right mid tibia area. This required aggressive debridement with a #5 curette. Integumentary (Hair, Skin) Wound #4 status is Open. Original cause of wound was Gradually Appeared. The wound is located on the [place]. The wound measures 0.7cm length x 1.2cm  width x 1.8cm depth; 0.66cm^2 area and 1.188cm^3 volume. There is Fat Layer (Subcutaneous Tissue) Exposed exposed. There is no undermining noted, however, there is tunneling at 9:00 with a maximum distance of 4.7cm. There is a medium amount of purulent drainage noted. The wound margin is well defined and not attached to the wound base. There is small (1-33%) pink, pale granulation within the wound bed. There is a large (67-100%) amount of necrotic tissue within the wound bed including Adherent Slough. Wound #6 status is Open. Original cause of wound was Gradually Appeared. The wound is located on the Right Metatarsal head second. The wound measures 0.2cm length x 0.2cm width x 0.2cm depth; 0.031cm^2 area and 0.006cm^3 volume. There is Fat Layer (Subcutaneous Tissue) Exposed exposed. There is no tunneling or undermining noted. There is a small amount of serous drainage noted. The wound margin is thickened. There is large (67-100%) pink, pale granulation within the wound bed. There is no necrotic tissue within the wound bed. Wound #7 status is Open. Original cause of wound was Gradually Appeared. The wound is located on the Right,Proximal,Plantar Foot. The wound measures 0.4cm length x 0.4cm width x 0.5cm depth; 0.126cm^2 area and 0.063cm^3 volume. There is Fat Layer (Subcutaneous Tissue) Exposed exposed. There is no tunneling or undermining noted. There is a small amount of serous drainage noted. The wound margin is well defined and not attached to the wound base. There is small (1-33%) pink, pale granulation within the wound bed. There is a large (67-100%) amount of necrotic tissue within the wound bed including Adherent Slough. Wound #8 status is Open. Original cause of wound was Blister. The wound is located on the Right,Anterior Lower Leg. The wound measures 3.9cm length x 3.7cm width x 0.1cm depth; 11.333cm^2 area and 1.133cm^3 volume. There is Fat Layer (Subcutaneous Tissue) Exposed exposed.  There is no tunneling or undermining noted. There is a medium amount of serosanguineous drainage noted. The wound margin is flat and intact. There is small (1-33%) red granulation within the wound bed. There is a large (67-100%) amount of necrotic tissue within the wound bed including Eschar and Adherent Slough. Wound #9 status is Healed - Epithelialized. Original cause of wound was Gradually Appeared. The wound is located on the Right Toe Fifth. The wound measures 0cm length x 0cm width x 0cm depth; 0cm^2 area and 0cm^3 volume. There is no tunneling or undermining noted. There is a none present amount of drainage noted. The wound margin is flat and intact. There is no granulation within the wound bed. There is no necrotic tissue within the wound bed. 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 There was a Excisional Skin/Subcutaneous Tissue Debridement with a total area of 14.43 sq cm performed by Lindsey Murillo., MD. With the following instrument(s): Curette to remove Viable and Non-Viable tissue/material. Material removed includes Subcutaneous Tissue, Slough, Skin: Dermis, and Skin: Epidermis after achieving pain control using Other (BENZOCAINE). No specimens were taken. A time out was conducted at 16:20, prior to the start of the procedure. A Moderate  amount of bleeding was controlled with Pressure. The procedure was tolerated well with a pain level of 0 throughout and a pain level of 0 following the procedure. Post Debridement Measurements: 3.9cm length x 3.7cm width x 0.1cm depth; 1.133cm^3 volume. Character of Wound/Ulcer Post Debridement is improved. Severity of Tissue Post Debridement is: Fat layer exposed. Post procedure Diagnosis Wound #8: Same as Pre-Procedure Plan Follow-up Appointments: Return Appointment in 1 week. Dressing Change Frequency: Do not change entire dressing for one week. Wound Cleansing: Clean wound with Normal Saline. May shower and wash wound with soap and water. Primary Wound Dressing: Wound #4 Right,Distal,Plantar Foot: Calcium Alginate with Silver Wound #6 Right Metatarsal head second: Calcium Alginate with Silver Wound #7 Right,Proximal,Plantar Foot: Calcium Alginate with Silver Wound #8 Right,Anterior Lower Leg: Iodoflex Secondary Dressing: Dry Gauze ABD pad Drawtex Edema Control: 3 Layer Compression System - Right Lower Extremity Avoid standing for long periods of time Elevate legs to the level of the heart or above for 30 minutes daily and/or when sitting, a frequency of: - 3 to 4 times a day throughout the day. Off-Loading: Open toe surgical shoe to: - on right foot. ordered were: ORTHO SURGICAL CONSULT - REF TO DR DUDA FOR OSTEOMYLITIS AND ABCESS OF RIGHT FOOT The following medication(s) was prescribed: cefdinir oral 300 mg capsule 1 capsule oral bid for 10 days (d/c doxy) for right foot infection starting 04/17/2019 1. I am changing the dressing on the right tibial wound to Iodoflex. Aggressive debridement today. 2. I am changing her antibiotics from doxycycline to Levaquin for the abundant Serratia that I cultured last week. There is still purulent drainage coming out of this site I will probably add Flagyl 3. The patient is going to need foot surgery I am hopeful that something can be  done to salvage the forefoot although she may lose the fifth ray. 4. The patient seems totally disinterested in all of this although her mother was more concerned. I am not sure how much the patient understands. Electronic Signature(s) Signed: 04/17/2019 4:47:02 PM By: Lindsey Ham MD Entered By: Lindsey Murillo on 04/17/2019 16:47:01 -------------------------------------------------------------------------------- SuperBill Details Patient Name: Date of Service: Lindsey Murillo 04/17/2019 Medical Record [id number] Patient Account Number: [id number] Date of Birth/Sex: Treating RN: [birth year] (23 y.o. 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 M86.471 Chronic osteomyelitis with draining sinus, right ankle and foot E10.42 Type 1 diabetes mellitus with diabetic polyneuropathy L97.518 Non-pressure chronic ulcer of other part of  right foot with other specified severity L97.512 Non-pressure chronic ulcer of other part of right foot with fat layer exposed L97.811 Non-pressure chronic ulcer of other part of right lower leg limited to breakdown of skin I89.0 Lymphedema, not elsewhere classified Facility Procedures CPT4 Code Description: [id number] - DEB SUBQ TISSUE 20 SQ CM/< ICD-10 Diagnosis Description L97.811 Non-pressure chronic ulcer of other part of right lower leg skin Modifier: limited to brea Quantity: 1 kdown of Physician Procedures Electronic Signature(s) Signed: 04/17/2019 5:53:37 PM By: Lindsey Ham MD Entered By: Lindsey Murillo on 04/17/2019 16:47:52

## 2019-04-17 NOTE — Progress Notes (Signed)
Lindsey Murillo, Lindsey Murillo ([id number]) Visit Report for 03/20/2019 HPI Details Patient Name: Date of Service: Lindsey Murillo, Lindsey Murillo 03/20/2019 10:30 AM Medical Record [id number] Patient Account Number: [id number] Date of Birth/Sex: Treating RN: Lindsey Murillo, Lindsey Murillo (23 y.o. Orvan Falconer Primary Care Provider: Baltazar Apo Other Clinician: Referring Provider: Treating Provider/Extender:Robson, Evlyn Kanner, [place] Dom in Treatment: 0 History of Present Illness HPI Description: 06/16/2018 ADMISSION This is a 23 year old type I diabetic patient. She was referred by her family physician Dr. Wolfgang Phoenix in [place] where they live. She had been seen in the urgent care at [facility] on 05/27/2018 with a large bulla on the right dorsal foot. An x-ray of the foot showed swelling of the base of her first toe and first MTP with no soft tissue gas or bony erosion. She came to the primary physician's attention in follow-up on 06/09/2018 noted to have a diabetic foot ulcer. She was advised to take work off. She has been using peroxide and Neosporin to the wound. Past medical history; the patient does not have a prior history of diabetic foot ulcers. She is listed as having both peripheral neuropathy and autonomic neuropathy. Chronic diarrhea thoracic back pain and anemia. ABIs in our clinic were noncompressible on the right 2/11; follow-up for this badly infected wound that we admitted to the clinic on 2/7. The area in question is on the right first metatarsal head. When she arrived in clinic last week there was swelling and erythema in the foot as well as on the lateral part of the first metatarsal head. Plain x-rays showed soft tissue wound with soft tissue gas noted overlying the first MTP no radiographic changes of osteomyelitis. The culture showed abundant group B strep. The Augmentin that I chose empirically last time clearly the right choice. She is offloading this in a healing sandal. She is not  working. She states her diabetes is under reasonably good control 2/14; continued follow-up. The area in question which is on the right first metatarsal head looks improved although still requiring debridement were using silver alginate. She is completing 10 days of Augmentin which she has tolerated well. According to the patient she is offloading this quite aggressively. She is still in a surgical shoe 2/21; continued follow-up. Right first metatarsal head generally improved but still requiring debridement there is less swelling and no erythema. She is offloading this in a surgical shoe. 2/28; right first metatarsal head generally improved but still requiring debridement we have been using Hydrofera Blue she is using a surgical shoe but I changed her to a Darco forefoot off loader today. Possible she will ultimately require a total contact cast 3/6 right first metatarsal head generally improved there is less adherent debris and generally washes off nicely with Anasept and gauze. Using Hydrofera Blue and a Darco. We are going to consider putting her in a total contact cast today however we did not have sufficient supplies 3/Murillo; our intake nurse reported serosanguineous drainage which was concerning. The patient denies any systemic symptoms we have been using Hydrofera Blue in today with anticipation of putting on a total contact cast. However we also noted swelling around the wound. 3/20 -Right first metatarsal head wound appears granulating, without areas of necrosis or debris surrounding skin appears good, she is completed or almost completed her Keflex course. We will continue with the alginate and she is doing the offloading with the surgical shoe and felt. 3/Murillo; right first metatarsal head. There is no evidence of infection currently. She has senescent looking wound  edges and some debris on the wound surface but in general things look quite a bit better today. We have been using alginate and  offloading her with a forefoot off loader. I am not going to consider total contact casting her in this environment until I can be certain with that the clinic will be open to deal with the obligatory first cast change in the routine changing 4/3; right first metatarsal head. No evidence of infection. The tissue looks healthy although there is some tunneling laterally towards the mid part of the foot. We are using silver alginate, I have changed that the silver collagen today. 4/9 right first metatarsal head. No evidence of infection. Using silver collagen. 4/17 right first metatarsal head the wound is actually larger today and I think deeper. There is no evidence of recurrent infection. 4/20; back for her obligatory first total contact cast change. The wound on her foot actually looks quite good on the right first metatarsal head however she clearly has a cast injury at the superior margin of the cast just at the folded level this was in spite of padding this area. 4/24; wound on the right first metatarsal head looks better. The cast injury wounds on the anterior tibial area also look better. 5/1 wound on the right first metatarsal head is closed over. The cost injury wounds on the anterior tibial are also closed. The patient has type 1 diabetes with polyneuropathy. I do not think she has the financial means for diabetic shoes with custom inserts READMISSION 03/20/2019 This is a patient with type 1 diabetes and polyneuropathy. She was seen earlier this year with a wound on the right first metatarsal head the closed over. We recommended that she offload this in her shoes. I do not believe that was done but in any case her wounds reopened sometime in October. She was seen in the ER on 10/20 with a diabetic foot ulcer. Right foot was swollen and erythematous. She was put on antibiotics although at the time of this dictation I am not exactly sure which one. An x-ray was done that showed no osteo-.  She has been soaking her foot in Epson salts and applying dry gauze. She is still working at [employer]. She has 4 open areas; one at the base of the second through fourth toes. One at the first met head 1 over the second met head and 1 over the midfoot. The second met head wound probes proximally towards her midfoot and midfoot wound probes superiorly although I could not connect these 2 wounds. There is no purulent drainage. A considerable amount of denuded skin over this area which I think is a remanent of cellulitis although the medial part of her foot is still erythematous there is no tenderness. For the most part I think this looks like treated cellulitis from her antibiotics in October. She has a superficial area of epithelial loss on the right lower mid tibia Past medical history; patient is a type I diabetic with polyneuropathy. She is not felt to have PAD.Marland Kitchen She is unaware of her hemoglobin A1c she is supposed to see an endocrinologist this afternoon. Electronic Signature(s) Signed: 03/20/2019 5:17:49 PM By: Linton Ham MD Entered By: Linton Ham on 03/20/2019 Murillo:52:03 -------------------------------------------------------------------------------- Physical Exam Details Patient Name: Date of Service: Lindsey Murillo, Lindsey Murillo 03/20/2019 10:30 AM Medical Record [id number] Patient Account Number: [id number] Date of Birth/Sex: Treating RN: Lindsey Murillo, Lindsey Murillo (23 y.o. Orvan Falconer Primary Care Provider: Baltazar Apo Other Clinician: Referring Provider: Treating Provider/Extender:Robson, Legrand Como  Baltazar Apo Weeks in Treatment: 0 Constitutional Sitting or standing Blood Pressure is within target range for patient.. Pulse regular and within target range for patient.Marland Kitchen Respirations regular, non-labored and within target range.. Temperature is normal and within the target range for the patient.Marland Kitchen Appears in no distress. Eyes Conjunctivae clear. No discharge.no  icterus. Respiratory work of breathing is normal. Cardiovascular Palpable in the popliteal and inguinal area on the right. It will pulses palpable. Appears to have lymphedema which I do not recall right greater than left calfs. Gastrointestinal (GI) Abdomen is soft and non-distended without masses or tenderness.. No liver or spleen enlargement. Lymphatic No lymphadenopathy in the popliteal or inguinal area. Integumentary (Hair, Skin) The lower legs. Psychiatric Patient appears depressed today.. Notes Wound exam; the foot is large and somewhat swollen. She also has lymphedema in the right lower leg greater than left I do not remember this from last time. She has denuded skin over the plantar part of her foot which I removed. The area over her base of her toes 2-4 is fairly healthy looking. There is no depth no probing bone similarly the first met head wound does not probe either. However the second met head wound probes towards the mid part of her foot. There is a wound on the mid part of her foot that probes about 4.5 cm distally. There is dusky erythema in the foot but no purulent drainage Electronic Signature(s) Signed: 03/20/2019 5:17:49 PM By: Linton Ham MD Entered By: Linton Ham on 03/20/2019 Murillo:57:19 -------------------------------------------------------------------------------- Physician Orders Details Patient Name: Date of Service: Lindsey Hew. 03/20/2019 10:30 AM Medical Record [id number] Patient Account Number: [id number] Date of Birth/Sex: Treating RN: Lindsey Murillo (23 y.o. Orvan Falconer Primary Care Provider: Baltazar Apo Other Clinician: Referring Provider: Treating Provider/Extender:Robson, Evlyn Kanner, [place] Dom in Treatment: 0 Verbal / Phone Orders: No Diagnosis Coding Follow-up Appointments Return Appointment in 1 week. Dressing Change Frequency Wound #4 Right,Distal,Plantar Foot Change dressing every day. Wound #5 Right  Metatarsal head first Change dressing every day. Wound #6 Right Metatarsal head second Change dressing every day. Wound #7 Right,Proximal,Plantar Foot Change dressing every day. Wound #8 Right,Anterior Lower Leg Change dressing every day. Wound Cleansing Clean wound with Normal Saline. Lindsey shower and wash wound with soap and water. Primary Wound Dressing Wound #4 Right,Distal,Plantar Foot Calcium Alginate with Silver - pack into undermining areas Wound #5 Right Metatarsal head first Calcium Alginate with Silver - pack into undermining areas Wound #6 Right Metatarsal head second Calcium Alginate with Silver - pack into undermining areas Wound #7 Right,Proximal,Plantar Foot Calcium Alginate with Silver - pack into undermining areas Wound #8 Right,Anterior Lower Leg Calcium Alginate with Silver - pack into undermining areas Secondary Dressing Wound #4 Right,Distal,Plantar Foot Kerlix/Rolled Gauze - secure with tape Dry Gauze ABD pad Drawtex Wound #5 Right Metatarsal head first Kerlix/Rolled Gauze - secure with tape Dry Gauze ABD pad Drawtex Wound #6 Right Metatarsal head second Kerlix/Rolled Gauze - secure with tape Dry Gauze ABD pad Drawtex Wound #7 Right,Proximal,Plantar Foot Kerlix/Rolled Gauze - secure with tape Dry Gauze ABD pad Drawtex Wound #8 Right,Anterior Lower Leg Kerlix/Rolled Gauze - secure with tape Dry Gauze ABD pad Drawtex Laboratory Bacteria identified in Unspecified specimen by Anaerobe culture (MICRO) - non healing wound LOINC Code: 635-3 Convenience Name: Anerobic culture Radiology MRI, right foot with contast and without contrast - non healing wound ****URGENT***** Patient Medications Allergies: No Known Allergies Notifications Medication Indication Start End Augmentin celluitis foot 03/20/2019 DOSE oral 875 mg-125 mg tablet -  1vtablet oral bid for 10 days Electronic Signature(s) Signed: 03/20/2019 5:17:49 PM By: Linton Ham  MD Signed: 04/17/2019 2:53:59 PM By: Carlene Coria RN Previous Signature: 03/20/2019 Murillo:Murillo:43 PM Version By: Linton Ham MD Entered By: Carlene Coria on 03/20/2019 14:29:37 -------------------------------------------------------------------------------- Prescription 03/20/2019 Patient Name: Lindsey Castle R. Provider: Linton Ham MD Date of Birth: [birth year]Lindsey[birth year]Lindsey Murillo NPI#: [id number] Sex: F DEA#: [id number] Phone #: [phone] License #: [id number] Patient Address: [facility] [address] [place], [place] [address] [place], [place] [address] [phone] Allergies No Known Allergies Provider's Orders MRI, right foot with contast and without contrast - non healing wound ****URGENT***** Signature(s): Date(s): Electronic Signature(s) Signed: 03/20/2019 5:17:49 PM By: Linton Ham MD Signed: 04/17/2019 2:53:59 PM By: Carlene Coria RN Entered By: Carlene Coria on 03/20/2019 14:29:39 --------------------------------------------------------------------------------  Problem List Details Patient Name: Date of Service: Lindsey Hew. 03/20/2019 10:30 AM Medical Record [id number] Patient Account Number: [id number] Date of Birth/Sex: Treating RN: Lindsey Murillo (23 y.o. Orvan Falconer Primary Care Provider: Baltazar Apo Other Clinician: Referring Provider: Treating Provider/Extender:Robson, Evlyn Kanner, [place] Dom in Treatment: 0 Active Problems ICD-10 Evaluated Encounter Code Description Active Date Today Diagnosis E10.621 Type 1 diabetes mellitus with foot ulcer 03/20/2019 No Yes L03.115 Cellulitis of right lower limb 03/20/2019 No Yes E10.42 Type 1 diabetes mellitus with diabetic polyneuropathy 03/20/2019 No Yes L97.518 Non-pressure chronic ulcer of other part of right foot 03/20/2019 No Yes with other specified severity L97.512 Non-pressure chronic ulcer of other part of right foot 03/20/2019  No Yes with fat layer exposed L97.811 Non-pressure chronic ulcer of other part of right lower 03/20/2019 No Yes leg limited to breakdown of skin I89.0 Lymphedema, not elsewhere classified 03/20/2019 No Yes Inactive Problems Resolved Problems Electronic Signature(s) Signed: 04/10/2019 6:45:39 PM By: Linton Ham MD Previous Signature: 03/20/2019 5:17:49 PM Version By: Linton Ham MD Entered By: Linton Ham on Murillo/Murillo/2020 18:09:10 -------------------------------------------------------------------------------- Progress Note Details Patient Name: Date of Service: Lindsey Hew. 03/20/2019 10:30 AM Medical Record [id number] Patient Account Number: [id number] Date of Birth/Sex: Treating RN: Lindsey Murillo (23 y.o. Orvan Falconer Primary Care Provider: Baltazar Apo Other Clinician: Referring Provider: Treating Provider/Extender:Robson, Evlyn Kanner, [place] Dom in Treatment: 0 Subjective History of Present Illness (HPI) 06/16/2018 ADMISSION This is a 23 year old type I diabetic patient. She was referred by her family physician Dr. Wolfgang Phoenix in [place] where they live. She had been seen in the urgent care at [facility] on 05/27/2018 with a large bulla on the right dorsal foot. An x-ray of the foot showed swelling of the base of her first toe and first MTP with no soft tissue gas or bony erosion. She came to the primary physician's attention in follow-up on 06/09/2018 noted to have a diabetic foot ulcer. She was advised to take work off. She has been using peroxide and Neosporin to the wound. Past medical history; the patient does not have a prior history of diabetic foot ulcers. She is listed as having both peripheral neuropathy and autonomic neuropathy. Chronic diarrhea thoracic back pain and anemia. ABIs in our clinic were noncompressible on the right 2/11; follow-up for this badly infected wound that we admitted to the clinic on 2/7. The area in question is on  the right first metatarsal head. When she arrived in clinic last week there was swelling and erythema in the foot as well as on the lateral part of the first metatarsal head. Plain x-rays showed soft tissue wound with soft tissue gas noted  overlying the first MTP no radiographic changes of osteomyelitis. The culture showed abundant group B strep. The Augmentin that I chose empirically last time clearly the right choice. She is offloading this in a healing sandal. She is not working. She states her diabetes is under reasonably good control 2/14; continued follow-up. The area in question which is on the right first metatarsal head looks improved although still requiring debridement were using silver alginate. She is completing 10 days of Augmentin which she has tolerated well. According to the patient she is offloading this quite aggressively. She is still in a surgical shoe 2/21; continued follow-up. Right first metatarsal head generally improved but still requiring debridement there is less swelling and no erythema. She is offloading this in a surgical shoe. 2/28; right first metatarsal head generally improved but still requiring debridement we have been using Hydrofera Blue she is using a surgical shoe but I changed her to a Darco forefoot off loader today. Possible she will ultimately require a total contact cast 3/6 right first metatarsal head generally improved there is less adherent debris and generally washes off nicely with Anasept and gauze. Using Hydrofera Blue and a Darco. We are going to consider putting her in a total contact cast today however we did not have sufficient supplies 3/Murillo; our intake nurse reported serosanguineous drainage which was concerning. The patient denies any systemic symptoms we have been using Hydrofera Blue in today with anticipation of putting on a total contact cast. However we also noted swelling around the wound. 3/20 -Right first metatarsal head wound  appears granulating, without areas of necrosis or debris surrounding skin appears good, she is completed or almost completed her Keflex course. We will continue with the alginate and she is doing the offloading with the surgical shoe and felt. 3/Murillo; right first metatarsal head. There is no evidence of infection currently. She has senescent looking wound edges and some debris on the wound surface but in general things look quite a bit better today. We have been using alginate and offloading her with a forefoot off loader. I am not going to consider total contact casting her in this environment until I can be certain with that the clinic will be open to deal with the obligatory first cast change in the routine changing 4/3; right first metatarsal head. No evidence of infection. The tissue looks healthy although there is some tunneling laterally towards the mid part of the foot. We are using silver alginate, I have changed that the silver collagen today. 4/9 right first metatarsal head. No evidence of infection. Using silver collagen. 4/17 right first metatarsal head the wound is actually larger today and I think deeper. There is no evidence of recurrent infection. 4/20; back for her obligatory first total contact cast change. The wound on her foot actually looks quite good on the right first metatarsal head however she clearly has a cast injury at the superior margin of the cast just at the folded level this was in spite of padding this area. 4/24; wound on the right first metatarsal head looks better. The cast injury wounds on the anterior tibial area also look better. 5/1 wound on the right first metatarsal head is closed over. The cost injury wounds on the anterior tibial are also closed. The patient has type 1 diabetes with polyneuropathy. I do not think she has the financial means for diabetic shoes with custom inserts READMISSION 03/20/2019 This is a patient with type 1 diabetes and  polyneuropathy. She was  seen earlier this year with a wound on the right first metatarsal head the closed over. We recommended that she offload this in her shoes. I do not believe that was done but in any case her wounds reopened sometime in October. She was seen in the ER on 10/20 with a diabetic foot ulcer. Right foot was swollen and erythematous. She was put on antibiotics although at the time of this dictation I am not exactly sure which one. An x-ray was done that showed no osteo-. She has been soaking her foot in Epson salts and applying dry gauze. She is still working at [employer]. She has 4 open areas; one at the base of the second through fourth toes. One at the first met head 1 over the second met head and 1 over the midfoot. The second met head wound probes proximally towards her midfoot and midfoot wound probes superiorly although I could not connect these 2 wounds. There is no purulent drainage. A considerable amount of denuded skin over this area which I think is a remanent of cellulitis although the medial part of her foot is still erythematous there is no tenderness. For the most part I think this looks like treated cellulitis from her antibiotics in October. She has a superficial area of epithelial loss on the right lower mid tibia Past medical history; patient is a type I diabetic with polyneuropathy. She is not felt to have PAD.Marland Kitchen She is unaware of her hemoglobin A1c she is supposed to see an endocrinologist this afternoon. Patient History Information obtained from Patient. Allergies No Known Allergies Family History Cancer - Father, Diabetes - Mother, Hypertension - Mother, No family history of Heart Disease, Hereditary Spherocytosis, Kidney Disease, Lung Disease, Seizures, Stroke, Thyroid Problems, Tuberculosis. Social History Never smoker, Marital Status - Single, Alcohol Use - Rarely, Drug Use - No History, Caffeine Use - Moderate. Medical History Eyes Denies history  of Cataracts, Glaucoma, Optic Neuritis Ear/Nose/Mouth/Throat Denies history of Chronic sinus problems/congestion, Middle ear problems Hematologic/Lymphatic Patient has history of Anemia Denies history of Hemophilia, Human Immunodeficiency Virus, Lymphedema, Sickle Cell Disease Respiratory Denies history of Aspiration, Asthma, Chronic Obstructive Pulmonary Disease (COPD), Pneumothorax, Sleep Apnea, Tuberculosis Cardiovascular Denies history of Angina, Arrhythmia, Congestive Heart Failure, Coronary Artery Disease, Deep Vein Thrombosis, Hypertension, Hypotension, Myocardial Infarction, Peripheral Arterial Disease, Peripheral Venous Disease, Phlebitis, Vasculitis Gastrointestinal Denies history of Cirrhosis , Colitis, Crohnoos, Hepatitis A, Hepatitis B, Hepatitis C Endocrine Patient has history of Type I Diabetes Denies history of Type II Diabetes Genitourinary Denies history of End Stage Renal Disease Immunological Denies history of Lupus Erythematosus, Raynaudoos, Scleroderma Integumentary (Skin) Denies history of History of Burn Musculoskeletal Denies history of Gout, Rheumatoid Arthritis, Osteoarthritis, Osteomyelitis Neurologic Denies history of Dementia, Neuropathy, Quadriplegia, Paraplegia, Seizure Disorder Oncologic Denies history of Received Chemotherapy, Received Radiation Psychiatric Denies history of Anorexia/bulimia, Confinement Anxiety Patient is treated with Insulin, Oral Agents. Blood sugar is tested. Hospitalization/Surgery History - DM. Medical And Surgical History Notes Endocrine thyroiditis Immunological thyroiditis Review of Systems (ROS) Constitutional Symptoms (General Health) Denies complaints or symptoms of Fatigue, Fever, Chills, Marked Weight Change. Eyes Denies complaints or symptoms of Dry Eyes, Vision Changes, Glasses / Contacts. Ear/Nose/Mouth/Throat Denies complaints or symptoms of Chronic sinus problems or rhinitis. Respiratory Denies  complaints or symptoms of Chronic or frequent coughs, Shortness of Breath. Cardiovascular Denies complaints or symptoms of Chest pain, lower extremity edema Gastrointestinal Denies complaints or symptoms of Frequent diarrhea, Nausea, Vomiting. Genitourinary Denies complaints or symptoms of Frequent urination. Integumentary (Skin) Complains  or has symptoms of Wounds - right plantar foot. Musculoskeletal Denies complaints or symptoms of Muscle Pain, Muscle Weakness. Neurologic Denies complaints or symptoms of Numbness/parasthesias. Psychiatric Denies complaints or symptoms of Claustrophobia, Suicidal. Objective Constitutional Sitting or standing Blood Pressure is within target range for patient.. Pulse regular and within target range for patient.Marland Kitchen Respirations regular, non-labored and within target range.. Temperature is normal and within the target range for the patient.Marland Kitchen Appears in no distress. Vitals Time Taken: 10:39 AM, Height: 68 in, Source: Stated, Weight: 137 lbs, Source: Stated, BMI: 20.8, Temperature: 98.2 F, Pulse: 115 bpm, Respiratory Rate: 18 breaths/min, Blood Pressure: 130/89 mmHg, Capillary Blood Glucose: 145 mg/dl. General Notes: glucose per pt report yesterday Eyes Conjunctivae clear. No discharge.no icterus. Respiratory work of breathing is normal. Cardiovascular Palpable in the popliteal and inguinal area on the right. It will pulses palpable. Appears to have lymphedema which I do not recall right greater than left calfs. Gastrointestinal (GI) Abdomen is soft and non-distended without masses or tenderness.. No liver or spleen enlargement. Lymphatic No lymphadenopathy in the popliteal or inguinal area. Psychiatric Patient appears depressed today.. General Notes: Wound exam; the foot is large and somewhat swollen. She also has lymphedema in the right lower leg greater than left I do not remember this from last time. She has denuded skin over the plantar part of  her foot which I removed. The area over her base of her toes 2-4 is fairly healthy looking. There is no depth no probing bone similarly the first met head wound does not probe either. However the second met head wound probes towards the mid part of her foot. There is a wound on the mid part of her foot that probes about 4.5 cm distally. There is dusky erythema in the foot but no purulent drainage Integumentary (Hair, Skin) The lower legs. Wound #4 status is Open. Original cause of wound was Gradually Appeared. The wound is located on the [place]. The wound measures 2cm length x 3.5cm width x 0.2cm depth; 5.498cm^2 area and 1.1cm^3 volume. There is Fat Layer (Subcutaneous Tissue) Exposed exposed. There is no tunneling or undermining noted. There is a large amount of serosanguineous drainage noted. Foul odor after cleansing was noted. The wound margin is flat and intact. There is large (67-100%) red granulation within the wound bed. There is a small (1-33%) amount of necrotic tissue within the wound bed including Adherent Slough. General Notes: macerated wound margins Wound #5 status is Open. Original cause of wound was Gradually Appeared. The wound is located on the Right Metatarsal head first. The wound measures 0.3cm length x 0.5cm width x 0.2cm depth; 0.118cm^2 area and 0.024cm^3 volume. There is Fat Layer (Subcutaneous Tissue) Exposed exposed. There is no tunneling or undermining noted. There is a small amount of serous drainage noted. The wound margin is flat and intact. There is large (67-100%) red granulation within the wound bed. There is no necrotic tissue within the wound bed. General Notes: macerated wound margins Wound #6 status is Open. Original cause of wound was Gradually Appeared. The wound is located on the Right Metatarsal head second. The wound measures 0.6cm length x 0.3cm width x 0.8cm depth; 0.141cm^2 area and 0.113cm^3 volume. There is Fat Layer  (Subcutaneous Tissue) Exposed exposed. There is no tunneling noted, however, there is undermining starting at 7:00 and ending at 8:00 with a maximum distance of 1.4cm. There is a medium amount of serous drainage noted. Foul odor after cleansing was noted. The wound margin is  thickened. There is large (67-100%) red granulation within the wound bed. There is no necrotic tissue within the wound bed. Wound #7 status is Open. Original cause of wound was Gradually Appeared. The wound is located on the Right,Proximal,Plantar Foot. The wound measures 0.4cm length x 1.4cm width x 0.7cm depth; 0.44cm^2 area and 0.308cm^3 volume. There is Fat Layer (Subcutaneous Tissue) Exposed exposed. There is no tunneling noted, however, there is undermining starting at 10:00 and ending at 11:00 with a maximum distance of 4.5cm. There is a large amount of serous drainage noted. Foul odor after cleansing was noted. The wound margin is distinct with the outline attached to the wound base. There is large (67-100%) red granulation within the wound bed. There is no necrotic tissue within the wound bed. General Notes: macerated wound margins Wound #8 status is Open. Original cause of wound was Blister. The wound is located on the Right,Anterior Lower Leg. The wound measures 2.4cm length x 3.1cm width x 0.1cm depth; 5.843cm^2 area and 0.584cm^3 volume. The wound is limited to skin breakdown. There is no tunneling or undermining noted. There is a small amount of serous drainage noted. The wound margin is flat and intact. There is no granulation within the wound bed. There is no necrotic tissue within the wound bed. 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 Dressing Change Frequency: Wound #4 Right,Distal,Plantar Foot: Change dressing every day. Wound #5 Right Metatarsal head first: Change dressing every day. Wound #6 Right Metatarsal head second: Change dressing every day. Wound #7 Right,Proximal,Plantar Foot: Change dressing every day. Wound #8 Right,Anterior Lower Leg: Change dressing every day. Wound Cleansing: Clean wound with Normal Saline. Lindsey shower and wash wound with soap and water. Primary Wound Dressing: Wound #4 Right,Distal,Plantar Foot: Calcium Alginate with Silver - pack into undermining areas Wound #5 Right Metatarsal head first: Calcium Alginate with Silver - pack into undermining areas Wound #6 Right Metatarsal head second: Calcium Alginate with Silver - pack into undermining areas Wound #7 Right,Proximal,Plantar Foot: Calcium Alginate with Silver - pack into undermining areas Wound #8 Right,Anterior Lower Leg: Calcium Alginate with Silver - pack into undermining areas Secondary Dressing: Wound #4 Right,Distal,Plantar Foot: Kerlix/Rolled Gauze - secure with tape Dry Gauze ABD pad Drawtex Wound #5 Right Metatarsal head first: Kerlix/Rolled Gauze - secure with tape Dry Gauze ABD pad Drawtex Wound #6 Right Metatarsal head second: Kerlix/Rolled Gauze - secure with tape Dry Gauze ABD pad Drawtex Wound #7 Right,Proximal,Plantar Foot: Kerlix/Rolled Gauze - secure with tape Dry Gauze ABD pad Drawtex Wound #8 Right,Anterior Lower Leg: Kerlix/Rolled Gauze - secure with tape Dry Gauze ABD pad Drawtex Radiology ordered were: MRI, lower extremity with contast right foot - non healing wound ****URGENT***** Laboratory ordered were: Anerobic culture - non healing wound The following medication(s) was prescribed:  Augmentin oral 875 mg-125 mg tablet 1vtablet oral bid for 10 days for celluitis foot starting 03/20/2019 1. Silver alginate into all wound areas. Packing into the undermining areas 2. I have cultured the wound deeply in the midfoot which probes towards her toes. 3. Empiric Augmentin 875/125 twice daily for 10 days.  4. She will definitely need an MRI of this foot. She had an x-ray done on 10/20 that did not show any osteomyelitis. 5. She will need to offload this foot. I am not sure we gave her a surgical shoe will need to take care of that next time 6. I do not believe there is an arterial issue here 7. I told her that this is a limb threatening situation. I want to get the MRI done as soon as possible. I have taken her out of work and I have written her a work note 8o Lymphedema Engineer, maintenance) Signed: 03/20/2019 [place]:58:00 PM By: Linton Ham MD Entered By: Linton Ham on 03/20/2019 [place]:57:59 -------------------------------------------------------------------------------- HxROS Details Patient Name: Date of Service: Lindsey Hew. 03/20/2019 10:30 AM Medical Record [id number] Patient Account Number: [id number] Date of Birth/Sex: Treating RN: Lindsey Murillo, Lindsey Murillo (23 y.o. Elam Dutch Primary Care Provider: Baltazar Apo Other Clinician: Referring Provider: Treating Provider/Extender:Robson, Evlyn Kanner, [place] Dom in Treatment: 0 Information Obtained From Patient Constitutional Symptoms (General Health) Complaints and Symptoms: Negative for: Fatigue; Fever; Chills; Marked Weight Change Eyes Complaints and Symptoms: Negative for: Dry Eyes; Vision Changes; Glasses / Contacts Medical History: Negative for: Cataracts; Glaucoma; Optic Neuritis Ear/Nose/Mouth/Throat Complaints and Symptoms: Negative for: Chronic sinus problems or rhinitis Medical History: Negative for: Chronic sinus problems/congestion; Middle ear problems Respiratory Complaints and  Symptoms: Negative for: Chronic or frequent coughs; Shortness of Breath Medical History: Negative for: Aspiration; Asthma; Chronic Obstructive Pulmonary Disease (COPD); Pneumothorax; Sleep Apnea; Tuberculosis Cardiovascular Complaints and Symptoms: Negative for: Chest pain Review of System Notes: lower extremity edema Medical History: Negative for: Angina; Arrhythmia; Congestive Heart Failure; Coronary Artery Disease; Deep Vein Thrombosis; Hypertension; Hypotension; Myocardial Infarction; Peripheral Arterial Disease; Peripheral Venous Disease; Phlebitis; Vasculitis Gastrointestinal Complaints and Symptoms: Negative for: Frequent diarrhea; Nausea; Vomiting Medical History: Negative for: Cirrhosis ; Colitis; Crohns; Hepatitis A; Hepatitis B; Hepatitis C Genitourinary Complaints and Symptoms: Negative for: Frequent urination Medical History: Negative for: End Stage Renal Disease Integumentary (Skin) Complaints and Symptoms: Positive for: Wounds - right plantar foot Medical History: Negative for: History of Burn Musculoskeletal Complaints and Symptoms: Negative for: Muscle Pain; Muscle Weakness Medical History: Negative for: Gout; Rheumatoid Arthritis; Osteoarthritis; Osteomyelitis Neurologic Complaints and Symptoms: Negative for: Numbness/parasthesias Medical History: Negative for: Dementia; Neuropathy; Quadriplegia; Paraplegia; Seizure Disorder Psychiatric Complaints and Symptoms: Negative for: Claustrophobia; Suicidal Medical History: Negative for: Anorexia/bulimia; Confinement Anxiety Hematologic/Lymphatic Medical History: Positive for: Anemia Negative for: Hemophilia; Human Immunodeficiency Virus; Lymphedema; Sickle Cell Disease Endocrine Medical History: Positive for: Type I Diabetes Negative for: Type II Diabetes Past Medical History Notes: thyroiditis Time with diabetes: [place] years Treated with: Insulin, Oral agents Blood sugar tested every day: Yes Tested :  2 times Immunological Medical History: Negative for: Lupus Erythematosus; Raynauds; Scleroderma Past Medical History Notes: thyroiditis Oncologic Medical History: Negative for: Received Chemotherapy; Received Radiation Immunizations Pneumococcal Vaccine: Received Pneumococcal Vaccination: No Implantable Devices None Hospitalization / Surgery History Type of Hospitalization/Surgery DM Family and Social History Cancer: Yes - Father; Diabetes: Yes - Mother; Heart Disease: No; Hereditary Spherocytosis: No; Hypertension: Yes - Mother; Kidney Disease: No; Lung Disease: No; Seizures: No; Stroke: No; Thyroid Problems: No; Tuberculosis: No; Never smoker; Marital Status - Single; Alcohol Use: Rarely; Drug Use: No History; Caffeine Use: Moderate; Financial Concerns: No; Food, Clothing or Shelter Needs: No; Support System Lacking: No; Transportation Concerns: No Engineer, maintenance) Signed: 03/20/2019 2:49:59 PM By: Baruch Gouty RN, BSN Signed: 03/20/2019 5:17:49 PM By: Linton Ham MD Entered By: Baruch Gouty on 03/20/2019 10:51:51 --------------------------------------------------------------------------------  SuperBill Details Patient Name: Date of Service: IRISHA, GRANDMAISON 03/20/2019 Medical Record [id number] Patient Account Number: [id number] Date of Birth/Sex: Treating RN: Lindsey Murillo, Lindsey Murillo (23 y.o. Orvan Falconer Primary Care Provider: Baltazar Apo Other Clinician: Referring Provider: Treating Provider/Extender:Robson, Evlyn Kanner, [place] Dom in Treatment: 0 Diagnosis Coding ICD-10 Codes Code Description E10.621 Type 1 diabetes mellitus with foot ulcer L03.115 Cellulitis of right lower limb E10.42 Type 1 diabetes mellitus with diabetic polyneuropathy L97.518 Non-pressure chronic ulcer of other part of right foot with other specified severity L97.512 Non-pressure chronic ulcer of other part of right foot with fat layer exposed L97.811 Non-pressure  chronic ulcer of other part of right lower leg limited to breakdown of skin I89.0 Lymphedema, not elsewhere classified Facility Procedures CPT4 Code: [id number] Description: 82423 - WOUND CARE VISIT-LEV 5 EST PT Modifier: Quantity: 1 Physician Procedures CPT4 Code Description: [id number] 15400 - WC PHYS LEVEL 4 - EST PT ICD-10 Diagnosis Description E10.621 Type 1 diabetes mellitus with foot ulcer L97.518 Non-pressure chronic ulcer of other part of right foot w L97.512 Non-pressure chronic ulcer of other  part of right foot w L97.811 Non-pressure chronic ulcer of other part of right lower skin Modifier: ith other specified ith fat layer expos leg limited to brea Quantity: 1 severity ed kdown of Electronic Signature(s) Signed: 03/20/2019 5:17:49 PM By: Linton Ham MD Signed: 04/17/2019 2:53:59 PM By: Carlene Coria RN Entered By: Carlene Coria on 03/20/2019 [place]:18:[place]

## 2019-04-18 NOTE — Progress Notes (Signed)
Lindsey, Murillo ([id number]) Visit Report for 04/17/2019 Arrival Information Details Patient Name: Date of Service: Lindsey Murillo, Lindsey Murillo 04/17/2019 3:00 PM Medical Record [id number] Patient Account Number: [id number] Date of Birth/Sex: Treating RN: 09-28-1995 (23 y.o. Lindsey Murillo, Lindsey Murillo Primary Care Lindsey Murillo: Lindsey Murillo Other Clinician: Referring Lindsey Murillo: Treating Lindsey Murillo/Extender:Lindsey Murillo, Lindsey Murillo in Treatment: 4 Visit Information History Since Last Visit Added or deleted any medications: No Patient Arrived: Ambulatory Any new allergies or adverse reactions: No Arrival Time: 15:33 Had a fall or experienced change in No Accompanied By: mother activities of daily living that may affect Transfer Assistance: None risk of falls: Patient Identification Verified: Yes Signs or symptoms of abuse/neglect since last No Secondary Verification Process Completed: Yes visito Patient Requires Transmission-Based No Hospitalized since last visit: No Precautions: Implantable device outside of the clinic excluding No Patient Has Alerts: No cellular tissue based products placed in the center since last visit: Has Dressing in Place as Prescribed: Yes Has Compression in Place as Prescribed: Yes Pain Present Now: No Electronic Signature(s) Signed: 04/17/2019 6:13:04 PM By: Lindsey Hurst RN, Lindsey Murillo Entered By: Lindsey Murillo on 04/17/2019 15:33:59 -------------------------------------------------------------------------------- Encounter Discharge Information Details Patient Name: Date of Service: Lindsey Hew. 04/17/2019 3:00 PM Medical Record [id number] Patient Account Number: [id number] Date of Birth/Sex: Treating RN: [birth year] (23 y.o. Lindsey Murillo Primary Care Katrena Stehlin: Lindsey Murillo Other Clinician: Referring Taijuan Serviss: Treating Lindsey Murillo/Extender:Lindsey Murillo, Lindsey Murillo in Treatment: 4 Encounter Discharge  Information Items Post Procedure Vitals Discharge Condition: Stable Temperature (F): 98 Ambulatory Status: Ambulatory Pulse (bpm): 106 Discharge Destination: Home Respiratory Rate (breaths/min): 16 Transportation: Private Auto Blood Pressure (mmHg): 143/101 Accompanied By: family member Schedule Follow-up Appointment: Yes Clinical Summary of Care: Patient Declined Electronic Signature(s) Signed: 04/18/2019 12:04:56 PM By: Lindsey Murillo Entered By: Lindsey Murillo on 04/17/2019 16:47:42 -------------------------------------------------------------------------------- Lower Extremity Assessment Details Patient Name: Date of Service: Lindsey Murillo 04/17/2019 3:00 PM Medical Record [id number] Patient Account Number: [id number] Date of Birth/Sex: Treating RN: 03-Jan-1996 (23 y.o. Lindsey Murillo Primary Care Lindsey Murillo: Lindsey Murillo Other Clinician: Referring Lindsey Murillo: Treating Lindsey Murillo:Lindsey Murillo, Lindsey Murillo in Treatment: 4 Edema Assessment Assessed: [Left: No] [Right: No] Edema: [Left: No] [Right: Yes] Calf Left: Right: Point of Measurement: cm From Medial Instep 38 cm 39.4 cm Ankle Left: Right: Point of Measurement: cm From Medial Instep 28 cm 24.5 cm Vascular Assessment Pulses: Dorsalis Pedis Palpable: [Right:Yes] Electronic Signature(s) Signed: 04/17/2019 6:13:04 PM By: Lindsey Hurst RN, Lindsey Murillo Entered By: Lindsey Murillo on 04/17/2019 15:55:48 -------------------------------------------------------------------------------- Multi Wound Chart Details Patient Name: Date of Service: Lindsey Hew. 04/17/2019 3:00 PM Medical Record [id number] Patient Account Number: [id number] Date of Birth/Sex: Treating RN: 1995-10-10 (23 y.o. F) Primary Care Lindsey Murillo: Other Clinician: Baltazar Murillo Referring Lindsey Murillo: Treating Lindsey Murillo/Extender:Lindsey Murillo, Lindsey Murillo in Treatment: 4 Vital Signs Height(in): 68  Capillary Blood 100 Glucose(mg/dl): Weight(lbs): 137 Pulse(bpm): 106 Body Mass Index(BMI): 21 Blood Pressure(mmHg): 143/101 Temperature(F): 98.0 Respiratory 16 Rate(breaths/min): Photos: [4:No Photos] [6:No Photos] [7:No Photos] Wound Location: [4:Right Foot - Plantar, Distal Right Metatarsal head] [6:second] [7:Right Foot - Plantar, Proximal] Wounding Event: [4:Gradually Appeared] [6:Gradually Appeared] [7:Gradually Appeared] 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] [6:Lower Extremity] [7:Lower Extremity] Secondary Etiology: [4:N/A] [6:N/A] [7:N/A] Comorbid History: [4:Anemia, Type I Diabetes Anemia, Type I Diabetes Anemia, Type I Diabetes] Date Acquired: [4:01/09/2019] [6:01/09/2019] [7:01/09/2019] Weeks of Treatment: [4:4] [6:4] [7:4] Wound Status: [4:Open] [6:Open] [7:Open] Measurements L x W x D 0.7x1.2x1.8 [6:0.2x0.2x0.2] [7:0.4x0.4x0.5] (cm) Area (cm) : [  4:0.66] [6:0.031] [7:0.126] Volume (cm) : [4:1.188] [6:0.006] [7:0.063] % Reduction in Area: [4:88.00%] [6:78.00%] [7:71.40%] % Reduction in Volume: -8.00% [6:94.70%] [7:79.50%] Position 1 (o'clock): 9 Maximum Distance 1 [4:4.7] (cm): Tunneling: [4:Yes] [6:No] [7:No] Classification: [4:Grade 1] [6:Grade 1] [7:Grade 2] Exudate Amount: [4:Medium] [6:Small] [7:Small] Exudate Type: [4:Purulent] [6:Serous] [7:Serous] Exudate Color: [4:yellow, brown, green] [6:amber] [7:amber] Wound Margin: [4:Well defined, not attached Thickened] [7:Well defined, not attached] Granulation Amount: [4:Small (1-33%)] [6:Large (67-100%)] [7:Small (1-33%)] Granulation Quality: [4:Pink, Pale] [6:Pink, Pale] [7:Pink, Pale] Necrotic Amount: [4:Large (67-100%)] [6:None Present (0%)] [7:Large (67-100%)] Necrotic Tissue: [4:Adherent Slough] [6:N/A] [7:Adherent Slough] Exposed Structures: [4:Fat Layer (Subcutaneous Fat Layer (Subcutaneous Fat Layer (Subcutaneous Tissue)  Exposed: Yes Fascia: No Tendon: No Muscle: No Joint: No Bone: No] [6:Tissue) Exposed: Yes Fascia: No Tendon: No Muscle: No Joint: No Bone: No] [7:Tissue) Exposed: Yes  Fascia: No Tendon: No Muscle: No Joint: No Bone: No] Epithelialization: [4:Small (1-33%)] [6:Medium (34-66%)] [7:Small (1-33%)] Debridement: [4:N/A] [6:N/A] [7:N/A] Pain Control: [4:N/A] [6:N/A] [7:N/A] Tissue Debrided: [4:N/A] [6:N/A] [7:N/A] Level: [4:N/A] [6:N/A] [7:N/A] Debridement Area (sq cm):N/A [6:N/A] [7:N/A] Instrument: [4:N/A] [6:N/A] [7:N/A] Bleeding: [4:N/A] [6:N/A] [7:N/A] Hemostasis Achieved: [4:N/A] [6:N/A] [7:N/A] Procedural Pain: [4:N/A] [6:N/A] [7:N/A] Post Procedural Pain: [4:N/A] [6:N/A] [7:N/A] Debridement Treatment [4:N/A] [6:N/A] [7:N/A] Response: Post Debridement [4:N/A] [6:N/A] [7:N/A] Measurements L x W x D (cm) Post Debridement [4:N/A] [6:N/A] [7:N/A] Volume: (cm) Procedures Performed: [4:N/A] [6:N/A 8] [7:N/A 9 N/A] Photos: [4:No Photos] [6:No Photos] [7:N/A] Wound Location: [4:Right Lower Leg - Anterior Right Toe Fifth] [7:N/A] Wounding Event: [4:Blister] [6:Gradually Appeared] [7:N/A] 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] [6:Lower Extremity] Secondary Etiology: [4:Lymphedema] [6:N/A] [7:N/A] Comorbid History: [4:Anemia, Type I Diabetes Anemia, Type I Diabetes N/A] Date Acquired: [4:03/18/2019] [6:04/10/2019] [7:N/A] Weeks of Treatment: [4:4] [6:1] [7:N/A] Wound Status: [4:Open] [6:Healed - Epithelialized] [7:N/A] Measurements L x W x D 3.9x3.7x0.1 [6:0x0x0] [7:N/A] (cm) Area (cm) : [4:11.333] [6:0] [7:N/A] Volume (cm) : [4:1.133] [6:0] [7:N/A] % Reduction in Area: [4:-94.00%] [6:100.00%] [7:N/A] % Reduction in Volume: -94.00% [6:100.00%] [7:N/A] Tunneling: [4:No] [6:No] [7:N/A] Classification: [4:Grade 1] [6:Grade 1] [7:N/A] Exudate Amount: [4:Medium] [6:None Present] [7:N/A] Exudate Type: [4:Serosanguineous] [6:N/A]  [7:N/A] Exudate Color: [4:red, brown] [6:N/A] [7:N/A] Wound Margin: [4:Flat and Intact] [6:Flat and Intact] [7:N/A] Granulation Amount: [4:Small (1-33%)] [6:None Present (0%)] [7:N/A] Granulation Quality: [4:Red] [6:N/A] [7:N/A] Necrotic Amount: [4:Large (67-100%)] [6:None Present (0%)] [7:N/A] Necrotic Tissue: [4:Eschar, Adherent Slough N/A] [7:N/A] Exposed Structures: [4:Fat Layer (Subcutaneous Fascia: No Tissue) Exposed: Yes Fascia: No Tendon: No Muscle: No Joint: No Bone: No] [6:Fat Layer (Subcutaneous Tissue) Exposed: No Tendon: No Muscle: No Joint: No Bone: No] [7:N/A] Epithelialization: [4:Small (1-33%)] [6:None] [7:N/A] Debridement: [4:Debridement - Excisional N/A] [7:N/A] Pre-procedure [4:16:20] [6:N/A] [7:N/A] Verification/Time Out Taken: Pain Control: [4:Other] [6:N/A] [7:N/A] Tissue Debrided: [4:Subcutaneous, Slough] [6:N/A] [7:N/A] Level: [4:Skin/Subcutaneous Tissue N/A] [7:N/A] Debridement Area (sq cm):14.43 [6:N/A] [7:N/A] Instrument: [4:Curette] [6:N/A] [7:N/A] Bleeding: [4:Moderate] [6:N/A] [7:N/A] Hemostasis Achieved: [4:Pressure] [6:N/A] [7:N/A] Procedural Pain: [4:0] [6:N/A] [7:N/A] Post Procedural Pain: [4:0] [6:N/A] [7:N/A] Debridement Treatment Procedure was tolerated [6:N/A] [7:N/A] Response: [4:well] Post Debridement [4:3.9x3.7x0.1] [6:N/A] [7:N/A] Measurements L x W x D (cm) Post Debridement [4:1.133] [6:N/A] [7:N/A] Volume: (cm) Procedures Performed: [4:Debridement] [6:N/A] [7:N/A] Treatment Notes Electronic Signature(s) Signed: 04/17/2019 5:53:37 PM By: Linton Ham MD Entered By: Linton Ham on 04/17/2019 16:35:17 -------------------------------------------------------------------------------- Multi-Disciplinary Care Plan Details Patient Name: Date of Service: Lindsey Hew. 04/17/2019 3:00 PM Medical Record [id number] Patient Account Number: [id number] Date of Birth/Sex: Treating RN: 05/17/1995 (23 y.o. F) Epps,  Morey Hummingbird Primary  Care Cleotha Whalin: Lindsey Murillo Other Clinician: Referring Calden Dorsey: Treating Wana Mount/Extender:Lindsey Murillo, Lindsey Murillo in Treatment: 4 Active Inactive Wound/Skin Impairment Nursing Diagnoses: Knowledge deficit related to ulceration/compromised skin integrity Goals: Patient/caregiver will verbalize understanding of skin care regimen Date Initiated: 03/20/2019 Target Resolution Date: 04/20/2019 Goal Status: Active Ulcer/skin breakdown will have a volume reduction of 30% by week 4 Date Initiated: 03/20/2019 Target Resolution Date: 04/20/2019 Goal Status: Active Interventions: Assess patient/caregiver ability to obtain necessary supplies Assess patient/caregiver ability to perform ulcer/skin care regimen upon admission and as needed Assess ulceration(s) every visit Notes: Electronic Signature(s) Signed: 04/18/2019 12:07:41 PM By: Carlene Coria RN Entered By: Carlene Coria on 04/17/2019 16:17:30 -------------------------------------------------------------------------------- Pain Assessment Details Patient Name: Date of Service: EISLEE, LIGMAN 04/17/2019 3:00 PM Medical Record [id number] Patient Account Number: [id number] Date of Birth/Sex: Treating RN: May 28, 1995 (23 y.o. Lindsey Murillo Primary Care Jaray Boliver: Lindsey Murillo Other Clinician: Referring Yetunde Leis: Treating Brayli Klingbeil/Extender:Lindsey Murillo, Lindsey Murillo in Treatment: 4 Active Problems Location of Pain Severity and Description of Pain Patient Has Paino No Site Locations Pain Management and Medication Current Pain Management: Electronic Signature(s) Signed: 04/17/2019 6:13:04 PM By: Lindsey Hurst RN, Lindsey Murillo Entered By: Lindsey Murillo on 04/17/2019 15:55:33 -------------------------------------------------------------------------------- Patient/Caregiver Education Details Murillo, Lindsey 12/8/2020andnbsp3:00 Patient Name: Date of Service: [name] Medical Record Patient  Account Number: [id number] Number: Treating RN: Carlene Coria Date of Birth/Gender: 1995/08/31 (23 y.o. F) Other Clinician: Primary Care Physician: Lindsey Murillo Referring Physician: Physician/Extender: Marylin Crosby in Treatment: 4 Education Assessment Education Provided To: Patient Education Topics Provided Wound/Skin Impairment: Methods: Explain/Verbal Responses: State content correctly Electronic Signature(s) Signed: 04/18/2019 12:07:41 PM By: Carlene Coria RN Entered By: Carlene Coria on 04/17/2019 16:17:44 -------------------------------------------------------------------------------- Wound Assessment Details Patient Name: Date of Service: Lindsey, Murillo 04/17/2019 3:00 PM Medical Record [id number] Patient Account Number: [id number] Date of Birth/Sex: Treating RN: [birth year] (23 y.o. Lindsey Murillo Primary Care Mellany Dinsmore: Lindsey Murillo Other Clinician: Referring Jada Kuhnert: Treating Twanisha Foulk/Extender:Lindsey Murillo, Lindsey Murillo in Treatment: 4 Wound Status Wound Number: 4 Primary Diabetic Wound/Ulcer of the Lower Etiology: Extremity Wound Location: Right Foot - Plantar, Distal Wound Status: Open Wounding Event: Gradually Appeared Comorbid Anemia, Type I Diabetes Date Acquired: 01/09/2019 History: Weeks Of Treatment: 4 Clustered Wound: No Photos Wound Measurements Length: (cm) 0.7 Width: (cm) 1.2 Depth: (cm) 1.8 Area: (cm) 0.66 Volume: (cm) 1.188 % Reduction in Area: 88% % Reduction in Volume: -8% Epithelialization: Small (1-33%) Tunneling: Yes Position (o'clock): 9 Maximum Distance: (cm) 4.7 Undermining: No Wound Description Classification: Grade 1 Wound Margin: Well defined, not attached Exudate Amount: Medium Exudate Type: Purulent Exudate Color: yellow, brown, green Wound Bed Granulation Amount: Small (1-33%) Granulation Quality: Pink, Pale Necrotic Amount: Large  (67-100%) Necrotic Quality: Adherent Slough Foul Odor After Cleansing: No Slough/Fibrino Yes Exposed Structure Fascia Exposed: No Fat Layer (Subcutaneous Tissue) Exposed: Yes Tendon Exposed: No Muscle Exposed: No Joint Exposed: No Bone Exposed: No Treatment Notes Wound #4 (Right, Distal, Plantar Foot) 1. Cleanse With Wound Cleanser Soap and water 3. Primary Dressing Applied Calcium Alginate Ag Iodoflex 4. Secondary Dressing ABD Pad Dry Gauze 6. Support Layer Applied 3 layer compression wrap Notes iodoflex to leg and silver alginate to all other sites. Electronic Signature(s) Signed: 04/18/2019 11:25:39 AM By: Mikeal Hawthorne EMT/HBOT Signed: 04/18/2019 4:59:21 PM By: Lindsey Hurst RN, Lindsey Murillo Previous Signature: 04/17/2019 6:13:04 PM Version By: Lindsey Hurst RN, Lindsey Murillo Entered By: Mikeal Hawthorne on 04/18/2019 09:49:58 -------------------------------------------------------------------------------- Wound Assessment Details Patient Name: Date of Service: Lindsey Murillo,  Lindsey R. 04/17/2019 3:00 PM Medical Record [id number] Patient Account Number: [id number] Date of Birth/Sex: Treating RN: 01-04-1996 (23 y.o. Lindsey Murillo Primary Care Antinette Keough: Lindsey Murillo Other Clinician: Referring Crystalann Korf: Treating Jadaya Sommerfield/Extender:Lindsey Murillo, Lindsey Murillo in Treatment: 4 Wound Status Wound Number: 6 Primary Diabetic Wound/Ulcer of the Lower Etiology: Extremity Wound Location: Right Metatarsal head second Wound Status: Open Wounding Event: Gradually Appeared Comorbid Anemia, Type I Diabetes Date Acquired: 01/09/2019 History: Weeks Of Treatment: 4 Clustered Wound: No Photos Wound Measurements Length: (cm) 0.2 % Reduc Width: (cm) 0.2 % Reduc Depth: (cm) 0.2 Epithel Area: (cm) 0.031 Tunnel Volume: (cm) 0.006 Underm Wound Description Classification: Grade 1 Wound Margin: Thickened Exudate Amount: Small Exudate Type: Serous Exudate Color: amber Wound  Bed Granulation Amount: Large (67-100%) Granulation Quality: Pink, Pale Necrotic Amount: None Present (0%) Foul Odor After Cleansing: No Slough/Fibrino Yes Exposed Structure Fascia Exposed: No Fat Layer (Subcutaneous Tissue) Exposed: Yes Tendon Exposed: No Muscle Exposed: No Joint Exposed: No Bone Exposed: No tion in Area: 78% tion in Volume: 94.7% ialization: Medium (34-66%) ing: No ining: No Treatment Notes Wound #6 (Right Metatarsal head second) 1. Cleanse With Wound Cleanser Soap and water 3. Primary Dressing Applied Calcium Alginate Ag Iodoflex 4. Secondary Dressing ABD Pad Dry Gauze 6. Support Layer Applied 3 layer compression wrap Notes iodoflex to leg and silver alginate to all other sites. Electronic Signature(s) Signed: 04/18/2019 11:25:39 AM By: Mikeal Hawthorne EMT/HBOT Signed: 04/18/2019 4:59:21 PM By: Lindsey Hurst RN, Lindsey Murillo Previous Signature: 04/17/2019 6:13:04 PM Version By: Lindsey Hurst RN, Lindsey Murillo Previous Signature: 04/17/2019 6:13:04 PM Version By: Lindsey Hurst RN, Lindsey Murillo Entered By: Mikeal Hawthorne on 04/18/2019 09:49:36 -------------------------------------------------------------------------------- Wound Assessment Details Patient Name: Date of Service: Lindsey, Murillo 04/17/2019 3:00 PM Medical Record [id number] Patient Account Number: [id number] Date of Birth/Sex: Treating RN: [birth year] (23 y.o. Lindsey Murillo Primary Care Ileigh Mettler: Lindsey Murillo Other Clinician: Referring Landen Knoedler: Treating Trapper Meech/Extender:Lindsey Murillo, Lindsey Murillo in Treatment: 4 Wound Status Wound Number: 7 Primary Diabetic Wound/Ulcer of the Lower Etiology: Extremity Wound Location: Right Foot - Plantar, Proximal Wound Status: Open Wounding Event: Gradually Appeared Comorbid Anemia, Type I Diabetes Date Acquired: 01/09/2019 History: Weeks Of Treatment: 4 Clustered Wound: No Photos Wound Measurements Length: (cm) 0.4 Width: (cm)  0.4 Depth: (cm) 0.5 Area: (cm) 0.126 Volume: (cm) 0.063 Wound Description Classification: Grade 2 Wound Margin: Well defined, not attached Exudate Amount: Small Exudate Type: Serous Exudate Color: amber Wound Bed Granulation Amount: Small (1-33%) Granulation Quality: Pink, Pale Necrotic Amount: Large (67-100%) Necrotic Quality: Adherent Slough After Cleansing: No rino Yes Exposed Structure osed: No (Subcutaneous Tissue) Exposed: Yes osed: No osed: No Exposed: No Exposed: No % Reduction in Area: 71.4% % Reduction in Volume: 79.5% Epithelialization: Small (1-33%) Tunneling: No Undermining: No Foul Odor Slough/Fib Fascia Exp Fat Layer Tendon Exp Muscle Exp Joint Bone Treatment Notes Wound #7 (Right, Proximal, Plantar Foot) 1. Cleanse With Wound Cleanser Soap and water 3. Primary Dressing Applied Calcium Alginate Ag Iodoflex 4. Secondary Dressing ABD Pad Dry Gauze 6. Support Layer Applied 3 layer compression wrap Notes iodoflex to leg and silver alginate to all other sites. Electronic Signature(s) Signed: 04/18/2019 11:25:39 AM By: Mikeal Hawthorne EMT/HBOT Signed: 04/18/2019 4:59:21 PM By: Lindsey Hurst RN, Lindsey Murillo Previous Signature: 04/17/2019 6:13:04 PM Version By: Lindsey Hurst RN, Lindsey Murillo Entered By: Mikeal Hawthorne on 04/18/2019 09:50:50 -------------------------------------------------------------------------------- Wound Assessment Details Patient Name: Date of Service: Lindsey Hew. 04/17/2019 3:00 PM Medical Record [id number] Patient Account Number: [id number] Date of Birth/Sex: Treating RN:  1995-08-14 (23 y.o. Lindsey Murillo Primary Care Yoseline Andersson: Lindsey Murillo Other Clinician: Referring Aaminah Forrester: Treating Pattrick Bady/Extender:Lindsey Murillo, Lindsey Murillo in Treatment: 4 Wound Status Wound Number: 8 Primary Etiology: Diabetic Wound/Ulcer of the Lower Extremity Wound Location: Right Lower Leg - Anterior Secondary  Lymphedema Wounding Event: Blister Etiology: Date Acquired: 03/18/2019 Wound Status: Open Weeks Of Treatment: 4 Comorbid Anemia, Type I Diabetes Clustered Wound: No History: Photos Wound Measurements Length: (cm) 3.9 % Red Width: (cm) 3.7 % Red Depth: (cm) 0.1 Epith Area: (cm) 11.333 Tunn Volume: (cm) 1.133 Unde Wound Description Classification: Grade 1 Wound Margin: Flat and Intact Exudate Amount: Medium Exudate Type: Serosanguineous Exudate Color: red, brown Wound Bed Granulation Amount: Small (1-33%) Granulation Quality: Red Necrotic Amount: Large (67-100%) Necrotic Quality: Eschar, Adherent Slough Foul Odor After Cleansing: No Slough/Fibrino No Exposed Structure Fascia Exposed: No Fat Layer (Subcutaneous Tissue) Exposed: Yes Tendon Exposed: No Muscle Exposed: No Joint Exposed: No Bone Exposed: No uction in Area: -94% uction in Volume: -94% elialization: Small (1-33%) eling: No rmining: No Treatment Notes Wound #8 (Right, Anterior Lower Leg) 1. Cleanse With Wound Cleanser Soap and water 3. Primary Dressing Applied Calcium Alginate Ag Iodoflex 4. Secondary Dressing ABD Pad Dry Gauze 6. Support Layer Applied 3 layer compression wrap Notes iodoflex to leg and silver alginate to all other sites. Electronic Signature(s) Signed: 04/18/2019 11:25:39 AM By: Mikeal Hawthorne EMT/HBOT Signed: 04/18/2019 4:59:21 PM By: Lindsey Hurst RN, Lindsey Murillo Previous Signature: 04/17/2019 6:13:04 PM Version By: Lindsey Hurst RN, Lindsey Murillo Entered By: Mikeal Hawthorne on 04/18/2019 09:49:14 -------------------------------------------------------------------------------- Wound Assessment Details Patient Name: Date of Service: Lindsey Hew. 04/17/2019 3:00 PM Medical Record [id number] Patient Account Number: [id number] Date of Birth/Sex: Treating RN: Aug 03, 1995 (23 y.o. Lindsey Murillo Primary Care Halley Kincer: Lindsey Murillo Other Clinician: Referring Lindsey Murillo: Treating  Tadeo Besecker/Extender:Lindsey Murillo, Lindsey Murillo in Treatment: 4 Wound Status Wound Number: 9 Primary Diabetic Wound/Ulcer of the Lower Etiology: Extremity Wound Location: Right Toe Fifth Wound Status: Healed - Epithelialized Wounding Event: Gradually Appeared Comorbid Anemia, Type I Diabetes Date Acquired: 04/10/2019 History: Weeks Of Treatment: 1 Clustered Wound: No Photos Wound Measurements Length: (cm) 0 % Reduction Width: (cm) 0 % Reduction Depth: (cm) 0 Epitheliali Area: (cm) 0 Tunneling: Volume: (cm) 0 Underminin Wound Description Classification: Grade 1 Foul Odor Wound Margin: Flat and Intact Slough/Fib Exudate Amount: None Present Wound Bed Granulation Amount: None Present (0%) Necrotic Amount: None Present (0%) Fascia Exp Fat Layer Tendon Exp Muscle Exp Joint Expo Bone Expos After Cleansing: No rino No Exposed Structure osed: No (Subcutaneous Tissue) Exposed: No osed: No osed: No sed: No ed: No in Area: 100% in Volume: 100% zation: None No g: No Electronic Signature(s) Signed: 04/18/2019 11:25:39 AM By: Mikeal Hawthorne EMT/HBOT Signed: 04/18/2019 4:59:21 PM By: Lindsey Hurst RN, Lindsey Murillo Previous Signature: 04/17/2019 6:13:04 PM Version By: Lindsey Hurst RN, Lindsey Murillo Entered By: Mikeal Hawthorne on 04/18/2019 09:48:54 -------------------------------------------------------------------------------- Vitals Details Patient Name: Date of Service: Lindsey Castle R. 04/17/2019 3:00 PM Medical Record [id number] Patient Account Number: [id number] Date of Birth/Sex: Treating RN: [birth year] (23 y.o. Lindsey Murillo Primary Care Tiny Rietz: Lindsey Murillo Other Clinician: Referring Shenoa Hattabaugh: Treating Kvon Mcilhenny/Extender:Lindsey Murillo, Lindsey Murillo in Treatment: 4 Vital Signs Time Taken: 15:35 Temperature (F): 98.0 Height (in): 68 Pulse (bpm): 106 Weight (lbs): 137 Respiratory Rate (breaths/min): 16 Body Mass Index (BMI): 20.8 Blood  Pressure (mmHg): 143/101 Capillary Blood Glucose (mg/dl): 100 Reference Range: 80 - 120 mg / dl Notes glucose per pt report Electronic Signature(s) Signed: 04/17/2019 6:13:04 PM  By: Lindsey Hurst RN, Lindsey Murillo Entered By: Lindsey Murillo on 04/17/2019 15:37:05

## 2019-04-24 ENCOUNTER — Other Ambulatory Visit: Payer: Self-pay

## 2019-04-24 ENCOUNTER — Encounter (HOSPITAL_BASED_OUTPATIENT_CLINIC_OR_DEPARTMENT_OTHER): Payer: PRIVATE HEALTH INSURANCE | Admitting: Internal Medicine

## 2019-04-24 DIAGNOSIS — E10621 Type 1 diabetes mellitus with foot ulcer: Secondary | ICD-10-CM | POA: Diagnosis not present

## 2019-04-25 NOTE — Progress Notes (Signed)
[name], [name] ([id number]) Visit Report for 04/24/2019 Debridement Details Patient Name: Lindsey Murillo, Lindsey Murillo. Date of Service: 04/24/2019 3:00 PM Medical Record [id number] Patient Account Number: [id number] Date of Birth/Sex: Treating RN: 04-04-1996 (23 y.o. F) Primary Care Provider: Baltazar Apo Other Clinician: Referring Provider: Treating Provider/Extender:Zanylah Hardie, Evlyn Kanner, [place] Dom in Treatment: 5 Debridement Performed for Wound #8 Right,Anterior Lower Leg Assessment: Performed By: Physician Ricard Dillon., MD Debridement Type: Debridement Severity of Tissue Pre Fat layer exposed Debridement: Level of Consciousness (Pre- Awake and Alert procedure): Pre-procedure Verification/Time Out Taken: Yes - 16:26 Start Time: 16:26 Pain Control: Lidocaine 5% topical ointment Total Area Debrided (L x W): 4 (cm) x 3.5 (cm) = 14 (cm) Tissue and other material Viable, Non-Viable, Slough, Subcutaneous, Slough debrided: Level: Skin/Subcutaneous Tissue Debridement Description: Excisional Instrument: Curette Bleeding: Moderate Hemostasis Achieved: Pressure End Time: 16:28 Procedural Pain: 0 Post Procedural Pain: 0 Response to Treatment: Procedure was tolerated well Level of Consciousness Awake and Alert (Post-procedure): Post Debridement Measurements of Total Wound Length: (cm) 4 Width: (cm) 3.5 Depth: (cm) 0.2 Volume: (cm) 2.199 Character of Wound/Ulcer Post Improved Debridement: Severity of Tissue Post Debridement: Fat layer exposed Post Procedure Diagnosis Same as Pre-procedure Electronic Signature(s) Signed: 04/24/2019 5:53:46 PM By: Linton Ham MD Entered By: Linton Ham on 04/24/2019 16:32:30 -------------------------------------------------------------------------------- HPI Details Patient Name: Date of Service: Lindsey Murillo. 04/24/2019 3:00 PM Medical Record [id number] Patient Account Number: [id number] Date of  Birth/Sex: Treating RN: 06/02/1995 (23 y.o. F) Primary Care Provider: Baltazar Apo Other Clinician: Referring Provider: Treating Provider/Extender:Marciel Offenberger, Evlyn Kanner, [place] Dom in Treatment: 5 History of Present Illness HPI Description: 06/16/2018 ADMISSION This is a 23 year old type I diabetic patient. She was referred by her family physician Dr. Wolfgang Phoenix in [place] where they live. She had been seen in the urgent care at [facility] on 05/27/2018 with a large bulla on the right dorsal foot. An x-ray of the foot showed swelling of the base of her first toe and first MTP with no soft tissue gas or bony erosion. She came to the primary physician's attention in follow-up on 06/09/2018 noted to have a diabetic foot ulcer. She was advised to take work off. She has been using peroxide and Neosporin to the wound. Past medical history; the patient does not have a prior history of diabetic foot ulcers. She is listed as having both peripheral neuropathy and autonomic neuropathy. Chronic diarrhea thoracic back pain and anemia. ABIs in our clinic were noncompressible on the right 2/11; follow-up for this badly infected wound that we admitted to the clinic on 2/7. The area in question is on the right first metatarsal head. When she arrived in clinic last week there was swelling and erythema in the foot as well as on the lateral part of the first metatarsal head. Plain x-rays showed soft tissue wound with soft tissue gas noted overlying the first MTP no radiographic changes of osteomyelitis. The culture showed abundant group B strep. The Augmentin that I chose empirically last time clearly the right choice. She is offloading this in a healing sandal. She is not working. She states her diabetes is under reasonably good control 2/14; continued follow-up. The area in question which is on the right first metatarsal head looks improved although still requiring debridement were using silver alginate.  She is completing 10 days of Augmentin which she has tolerated well. According to the patient she is offloading this quite aggressively. She is still in a surgical shoe 2/21; continued follow-up. Right first metatarsal head  generally improved but still requiring debridement there is less swelling and no erythema. She is offloading this in a surgical shoe. 2/28; right first metatarsal head generally improved but still requiring debridement we have been using Hydrofera Blue she is using a surgical shoe but I changed her to a Darco forefoot off loader today. Possible she will ultimately require a total contact cast 3/6 right first metatarsal head generally improved there is less adherent debris and generally washes off nicely with Anasept and gauze. Using Hydrofera Blue and a Darco. We are going to consider putting her in a total contact cast today however we did not have sufficient supplies 3/13; our intake nurse reported serosanguineous drainage which was concerning. The patient denies any systemic symptoms we have been using Hydrofera Blue in today with anticipation of putting on a total contact cast. However we also noted swelling around the wound. 3/20 -Right first metatarsal head wound appears granulating, without areas of necrosis or debris surrounding skin appears good, she is completed or almost completed her Keflex course. We will continue with the alginate and she is doing the offloading with the surgical shoe and felt. 3/27; right first metatarsal head. There is no evidence of infection currently. She has senescent looking wound edges and some debris on the wound surface but in general things look quite a bit better today. We have been using alginate and offloading her with a forefoot off loader. I am not going to consider total contact casting her in this environment until I can be certain with that the clinic will be open to deal with the obligatory first cast change in the routine  changing 4/3; right first metatarsal head. No evidence of infection. The tissue looks healthy although there is some tunneling laterally towards the mid part of the foot. We are using silver alginate, I have changed that the silver collagen today. 4/9 right first metatarsal head. No evidence of infection. Using silver collagen. 4/17 right first metatarsal head the wound is actually larger today and I think deeper. There is no evidence of recurrent infection. 4/20; back for her obligatory first total contact cast change. The wound on her foot actually looks quite good on the right first metatarsal head however she clearly has a cast injury at the superior margin of the cast just at the folded level this was in spite of padding this area. 4/24; wound on the right first metatarsal head looks better. The cast injury wounds on the anterior tibial area also look better. 5/1 wound on the right first metatarsal head is closed over. The cost injury wounds on the anterior tibial are also closed. The patient has type 1 diabetes with polyneuropathy. I do not think she has the financial means for diabetic shoes with custom inserts READMISSION 03/20/2019 This is a patient with type 1 diabetes and polyneuropathy. She was seen earlier this year with a wound on the right first metatarsal head the closed over. We recommended that she offload this in her shoes. I do not believe that was done but in any case her wounds reopened sometime in October. She was seen in the ER on 10/20 with a diabetic foot ulcer. Right foot was swollen and erythematous. She was put on antibiotics although at the time of this dictation I am not exactly sure which one. An x-ray was done that showed no osteo-. She has been soaking her foot in Epson salts and applying dry gauze. She is still working at [employer]. She has 4 open  areas; one at the base of the second through fourth toes. One at the first met head 1 over the second met head  and 1 over the midfoot. The second met head wound probes proximally towards her midfoot and midfoot wound probes superiorly although I could not connect these 2 wounds. There is no purulent drainage. A considerable amount of denuded skin over this area which I think is a remanent of cellulitis although the medial part of her foot is still erythematous there is no tenderness. For the most part I think this looks like treated cellulitis from her antibiotics in October. She has a superficial area of epithelial loss on the right lower mid tibia Past medical history; patient is a type I diabetic with polyneuropathy. She is not felt to have PAD.Marland Kitchen She is unaware of her hemoglobin A1c she is supposed to see an endocrinologist this afternoon. 11/19; MRI is tomorrow. Culture I did last time showed group G strep. This would have been covered by the empiric Augmentin I gave her when I first saw her. She should have 3 more days of this I am going to extend this by another 4 days until we have time to digest the MRI. If she has osteomyelitis she is going to need to see infectious disease for IV antibiotics. She has not been systemically unwell. She is tolerating the Augmentin well 12/1; the patient comes in today stating that her MRI that was supposed to be done on 11/20 was canceled by the hospital. She does not have an appointment. She has completed the Augmentin I gave her for the culture showing group G strep. Paradoxically she has less of an open area on the proximal foot just above her plantar metatarsals the midfoot also seems to be probing less and the left met head is closed. She also has a fairly extensive area on the right mid tibia in the setting of what looks to be lymphedema. According to our records this was present on admission but I do not think I looked at this specifically the last time she was here. 12/8; I finally had to send this patient to the ER in order to get an MRI performed. The MRI  showed findings consistent with osteomyelitis in the head and neck of the fifth metatarsal and the base and proximal phalanx of the fifth toe. There is also an abscess in the subcutaneous tissues dorsal to the fifth MTP and proximal phalanx of the little toe. Also noted that there is an abscess in the plantar soft tissues of the foot just proximal to the toes which extends from the great toe to the fourth toe. They did do an MRI of the tib-fib which was negative for underlying bone issues. Culture I did last week showed abundant Serratia and moderate group B strep They did not admit her last week from the ER which to me was surprising. She has purulent drainage coming out of the small wound at roughly the third met head. Erythema and swelling of the forefoot. She continues to have a large wound on the anterior mid tibia on the right 12/15; the patient still has the 2 small open areas over the second met head. Still draining purulent drainage although the entire foot looks less erythematous and less swollen. She still has the small area in the midfoot She has lymphedema in the upper leg the area on the mid tibia still requiring debridement but looks better. Electronic Signature(s) Signed: 04/24/2019 5:53:46 PM By: Linton Ham  MD Entered By: Linton Ham on 04/24/2019 16:33:43 -------------------------------------------------------------------------------- Physical Exam Details Patient Name: Date of Service: VINETA, CARONE 04/24/2019 3:00 PM Medical Record [id number] Patient Account Number: [id number] Date of Birth/Sex: Treating RN: 1995-05-30 (23 y.o. F) Primary Care Provider: Baltazar Apo Other Clinician: Referring Provider: Treating Provider/Extender:Latiesha Harada, Evlyn Kanner, [place] Dom in Treatment: 5 Constitutional Patient is hypertensive.. Pulse regular and within target range for patient.Marland Kitchen Respirations regular, non-labored and within target range..  Temperature is normal and within the target range for the patient.Marland Kitchen Appears in no distress. Notes Wound exam; the significant forefoot swelling is a lot better. As is the erythema. She still has the abscess over her met heads that extend all the way to the first and fifth met head. There is still a moderate amount of purulent drainage although not as much as last week. The open wound in the mid foot looks a lot better. Extensive debridement of the wound on the right mid tibia. Necrotic surface debris removed. Hemostasis with direct pressure. I think this is actually somewhat better to Electronic Signature(s) Signed: 04/24/2019 5:53:46 PM By: Linton Ham MD Entered By: Linton Ham on 04/24/2019 16:35:41 -------------------------------------------------------------------------------- Physician Orders Details Patient Name: Date of Service: Lindsey Murillo. 04/24/2019 3:00 PM Medical Record [id number] Patient Account Number: [id number] Date of Birth/Sex: Treating RN: Jul 01, 1995 (23 y.o. Orvan Falconer Primary Care Provider: Baltazar Apo Other Clinician: Referring Provider: Treating Provider/Extender:Lathan Gieselman, Evlyn Kanner, [place] Dom in Treatment: 5 Verbal / Phone Orders: No Diagnosis Coding ICD-10 Coding Code Description E10.621 Type 1 diabetes mellitus with foot ulcer M86.471 Chronic osteomyelitis with draining sinus, right ankle and foot E10.42 Type 1 diabetes mellitus with diabetic polyneuropathy L97.518 Non-pressure chronic ulcer of other part of right foot with other specified severity L97.512 Non-pressure chronic ulcer of other part of right foot with fat layer exposed L97.811 Non-pressure chronic ulcer of other part of right lower leg limited to breakdown of skin I89.0 Lymphedema, not elsewhere classified Follow-up Appointments Return Appointment in 1 week. Dressing Change Frequency Do not change entire dressing for one week. Wound Cleansing Clean  wound with Normal Saline. May shower and wash wound with soap and water. Primary Wound Dressing Wound #4 Right,Distal,Plantar Foot Calcium Alginate with Silver Wound #6 Right Metatarsal head second Calcium Alginate with Silver Wound #7 Right,Proximal,Plantar Foot Calcium Alginate with Silver Wound #8 Right,Anterior Lower Leg Iodoflex Secondary Dressing Dry Gauze ABD pad Drawtex Edema Control 3 Layer Compression System - Right Lower Extremity Avoid standing for long periods of time Elevate legs to the level of the heart or above for 30 minutes daily and/or when sitting, a frequency of: - 3 to 4 times a day throughout the day. Off-Loading Open toe surgical shoe to: - on right foot. Electronic Signature(s) Signed: 04/24/2019 5:53:46 PM By: Linton Ham MD Signed: 04/25/2019 9:49:12 AM By: Carlene Coria RN Entered By: Carlene Coria on 04/24/2019 15:55:38 -------------------------------------------------------------------------------- Problem List Details Patient Name: Date of Service: Lindsey Murillo. 04/24/2019 3:00 PM Medical Record [id number] Patient Account Number: [id number] Date of Birth/Sex: Treating RN: 01/08/1996 (23 y.o. Orvan Falconer Primary Care Provider: Baltazar Apo Other Clinician: Referring Provider: Treating Provider/Extender:Porsha Skilton, Evlyn Kanner, [place] Dom in Treatment: 5 Active Problems ICD-10 Evaluated Encounter Code Description Active Date Today Diagnosis E10.621 Type 1 diabetes mellitus with foot ulcer 03/20/2019 No Yes M86.471 Chronic osteomyelitis with draining sinus, right ankle 04/17/2019 No Yes and foot E10.42 Type 1 diabetes mellitus with diabetic polyneuropathy 03/20/2019 No Yes L97.518 Non-pressure chronic ulcer of other part of right  foot 03/20/2019 No Yes with other specified severity L97.512 Non-pressure chronic ulcer of other part of right foot 03/20/2019 No Yes with fat layer exposed L97.811 Non-pressure chronic  ulcer of other part of right lower 03/20/2019 No Yes leg limited to breakdown of skin I89.0 Lymphedema, not elsewhere classified 03/20/2019 No Yes Inactive Problems ICD-10 Code Description Active Date Inactive Date L03.115 Cellulitis of right lower limb 03/20/2019 03/20/2019 Resolved Problems Electronic Signature(s) Signed: 04/24/2019 5:53:46 PM By: Linton Ham MD Entered By: Linton Ham on 04/24/2019 16:28:33 -------------------------------------------------------------------------------- Progress Note Details Patient Name: Inda Castle R. Date of Service: 04/24/2019 3:00 PM Medical Record [id number] Patient Account Number: [id number] Date of Birth/Sex: Treating RN: 1995/10/07 (23 y.o. F) Primary Care Provider: Other Clinician: Baltazar Apo Referring Provider: Treating Provider/Extender:Chante Mayson, Evlyn Kanner, [place] Dom in Treatment: 5 Subjective History of Present Illness (HPI) 06/16/2018 ADMISSION This is a 23 year old type I diabetic patient. She was referred by her family physician Dr. Wolfgang Phoenix in [place] where they live. She had been seen in the urgent care at [facility] on 05/27/2018 with a large bulla on the right dorsal foot. An x-ray of the foot showed swelling of the base of her first toe and first MTP with no soft tissue gas or bony erosion. She came to the primary physician's attention in follow-up on 06/09/2018 noted to have a diabetic foot ulcer. She was advised to take work off. She has been using peroxide and Neosporin to the wound. Past medical history; the patient does not have a prior history of diabetic foot ulcers. She is listed as having both peripheral neuropathy and autonomic neuropathy. Chronic diarrhea thoracic back pain and anemia. ABIs in our clinic were noncompressible on the right 2/11; follow-up for this badly infected wound that we admitted to the clinic on 2/7. The area in question is on the right first metatarsal head.  When she arrived in clinic last week there was swelling and erythema in the foot as well as on the lateral part of the first metatarsal head. Plain x-rays showed soft tissue wound with soft tissue gas noted overlying the first MTP no radiographic changes of osteomyelitis. The culture showed abundant group B strep. The Augmentin that I chose empirically last time clearly the right choice. She is offloading this in a healing sandal. She is not working. She states her diabetes is under reasonably good control 2/14; continued follow-up. The area in question which is on the right first metatarsal head looks improved although still requiring debridement were using silver alginate. She is completing 10 days of Augmentin which she has tolerated well. According to the patient she is offloading this quite aggressively. She is still in a surgical shoe 2/21; continued follow-up. Right first metatarsal head generally improved but still requiring debridement there is less swelling and no erythema. She is offloading this in a surgical shoe. 2/28; right first metatarsal head generally improved but still requiring debridement we have been using Hydrofera Blue she is using a surgical shoe but I changed her to a Darco forefoot off loader today. Possible she will ultimately require a total contact cast 3/6 right first metatarsal head generally improved there is less adherent debris and generally washes off nicely with Anasept and gauze. Using Hydrofera Blue and a Darco. We are going to consider putting her in a total contact cast today however we did not have sufficient supplies 3/13; our intake nurse reported serosanguineous drainage which was concerning. The patient denies any systemic symptoms we have been using Hydrofera Blue  in today with anticipation of putting on a total contact cast. However we also noted swelling around the wound. 3/20 -Right first metatarsal head wound appears granulating, without areas of  necrosis or debris surrounding skin appears good, she is completed or almost completed her Keflex course. We will continue with the alginate and she is doing the offloading with the surgical shoe and felt. 3/27; right first metatarsal head. There is no evidence of infection currently. She has senescent looking wound edges and some debris on the wound surface but in general things look quite a bit better today. We have been using alginate and offloading her with a forefoot off loader. I am not going to consider total contact casting her in this environment until I can be certain with that the clinic will be open to deal with the obligatory first cast change in the routine changing 4/3; right first metatarsal head. No evidence of infection. The tissue looks healthy although there is some tunneling laterally towards the mid part of the foot. We are using silver alginate, I have changed that the silver collagen today. 4/9 right first metatarsal head. No evidence of infection. Using silver collagen. 4/17 right first metatarsal head the wound is actually larger today and I think deeper. There is no evidence of recurrent infection. 4/20; back for her obligatory first total contact cast change. The wound on her foot actually looks quite good on the right first metatarsal head however she clearly has a cast injury at the superior margin of the cast just at the folded level this was in spite of padding this area. 4/24; wound on the right first metatarsal head looks better. The cast injury wounds on the anterior tibial area also look better. 5/1 wound on the right first metatarsal head is closed over. The cost injury wounds on the anterior tibial are also closed. The patient has type 1 diabetes with polyneuropathy. I do not think she has the financial means for diabetic shoes with custom inserts READMISSION 03/20/2019 This is a patient with type 1 diabetes and polyneuropathy. She was seen earlier this  year with a wound on the right first metatarsal head the closed over. We recommended that she offload this in her shoes. I do not believe that was done but in any case her wounds reopened sometime in October. She was seen in the ER on 10/20 with a diabetic foot ulcer. Right foot was swollen and erythematous. She was put on antibiotics although at the time of this dictation I am not exactly sure which one. An x-ray was done that showed no osteo-. She has been soaking her foot in Epson salts and applying dry gauze. She is still working at [employer]. She has 4 open areas; one at the base of the second through fourth toes. One at the first met head 1 over the second met head and 1 over the midfoot. The second met head wound probes proximally towards her midfoot and midfoot wound probes superiorly although I could not connect these 2 wounds. There is no purulent drainage. A considerable amount of denuded skin over this area which I think is a remanent of cellulitis although the medial part of her foot is still erythematous there is no tenderness. For the most part I think this looks like treated cellulitis from her antibiotics in October. She has a superficial area of epithelial loss on the right lower mid tibia Past medical history; patient is a type I diabetic with polyneuropathy. She is not felt  to have PAD.Marland Kitchen She is unaware of her hemoglobin A1c she is supposed to see an endocrinologist this afternoon. 11/19; MRI is tomorrow. Culture I did last time showed group G strep. This would have been covered by the empiric Augmentin I gave her when I first saw her. She should have 3 more days of this I am going to extend this by another 4 days until we have time to digest the MRI. If she has osteomyelitis she is going to need to see infectious disease for IV antibiotics. She has not been systemically unwell. She is tolerating the Augmentin well 12/1; the patient comes in today stating that her MRI that was  supposed to be done on 11/20 was canceled by the hospital. She does not have an appointment. She has completed the Augmentin I gave her for the culture showing group G strep. Paradoxically she has less of an open area on the proximal foot just above her plantar metatarsals the midfoot also seems to be probing less and the left met head is closed. She also has a fairly extensive area on the right mid tibia in the setting of what looks to be lymphedema. According to our records this was present on admission but I do not think I looked at this specifically the last time she was here. 12/8; I finally had to send this patient to the ER in order to get an MRI performed. The MRI showed findings consistent with osteomyelitis in the head and neck of the fifth metatarsal and the base and proximal phalanx of the fifth toe. There is also an abscess in the subcutaneous tissues dorsal to the fifth MTP and proximal phalanx of the little toe. Also noted that there is an abscess in the plantar soft tissues of the foot just proximal to the toes which extends from the great toe to the fourth toe. They did do an MRI of the tib-fib which was negative for underlying bone issues. Culture I did last week showed abundant Serratia and moderate group B strep They did not admit her last week from the ER which to me was surprising. She has purulent drainage coming out of the small wound at roughly the third met head. Erythema and swelling of the forefoot. She continues to have a large wound on the anterior mid tibia on the right 12/15; the patient still has the 2 small open areas over the second met head. Still draining purulent drainage although the entire foot looks less erythematous and less swollen. She still has the small area in the midfoot She has lymphedema in the upper leg the area on the mid tibia still requiring debridement but looks better. Objective Constitutional Patient is hypertensive.. Pulse regular and  within target range for patient.Marland Kitchen Respirations regular, non-labored and within target range.. Temperature is normal and within the target range for the patient.Marland Kitchen Appears in no distress. Vitals Time Taken: 3:55 PM, Height: 68 in, Weight: 137 lbs, BMI: 20.8, Temperature: 98.5 F, Pulse: 115 bpm, Respiratory Rate: 16 breaths/min, Blood Pressure: 137/101 mmHg. General Notes: Wound exam; the significant forefoot swelling is a lot better. As is the erythema. She still has the abscess over her met heads that extend all the way to the first and fifth met head. There is still a moderate amount of purulent drainage although not as much as last week. The open wound in the mid foot looks a lot better. Extensive debridement of the wound on the right mid tibia. Necrotic surface debris removed. Hemostasis  with direct pressure. I think this is actually somewhat better to Integumentary (Hair, Skin) Wound #4 status is Open. Original cause of wound was Gradually Appeared. The wound is located on the Randall. The wound measures 0.6cm length x 1cm width x 1.6cm depth; 0.471cm^2 area and 0.754cm^3 volume. There is Fat Layer (Subcutaneous Tissue) Exposed exposed. There is no undermining noted, however, there is tunneling at 9:00 with a maximum distance of 4cm. There is a medium amount of purulent drainage noted. The wound margin is well defined and not attached to the wound base. There is small (1-33%) pink, pale granulation within the wound bed. There is a large (67-100%) amount of necrotic tissue within the wound bed including Adherent Slough. Wound #6 status is Healed - Epithelialized. Original cause of wound was Gradually Appeared. The wound is located on the Right Metatarsal head second. The wound measures 0cm length x 0cm width x 0cm depth; 0cm^2 area and 0cm^3 volume. There is no tunneling or undermining noted. There is a none present amount of drainage noted. The wound margin is thickened.  There is no granulation within the wound bed. There is no necrotic tissue within the wound bed. Wound #7 status is Open. Original cause of wound was Gradually Appeared. The wound is located on the Right,Proximal,Plantar Foot. The wound measures 0.2cm length x 0.3cm width x 0.6cm depth; 0.047cm^2 area and 0.028cm^3 volume. There is Fat Layer (Subcutaneous Tissue) Exposed exposed. There is no tunneling noted, however, there is undermining starting at 3:00 and ending at 6:00 with a maximum distance of 0.7cm. There is a small amount of serous drainage noted. The wound margin is well defined and not attached to the wound base. There is small (1-33%) pink, pale granulation within the wound bed. There is a large (67-100%) amount of necrotic tissue within the wound bed including Adherent Slough. Wound #8 status is Open. Original cause of wound was Blister. The wound is located on the Right,Anterior Lower Leg. The wound measures 4cm length x 3.5cm width x 0.2cm depth; 10.996cm^2 area and 2.199cm^3 volume. There is Fat Layer (Subcutaneous Tissue) Exposed exposed. There is no tunneling or undermining noted. There is a medium amount of serosanguineous drainage noted. The wound margin is flat and intact. There is small (1-33%) red granulation within the wound bed. There is a large (67-100%) amount of necrotic tissue within the wound bed including Adherent Slough. 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 There was a Excisional Skin/Subcutaneous Tissue Debridement with a total area of 14 sq cm performed by Ricard Dillon., MD. With the following instrument(s): Curette to remove Viable and Non-Viable tissue/material. Material removed includes Subcutaneous Tissue and Slough and after achieving pain control using Lidocaine 5% topical ointment. No specimens were taken. A time out was conducted at 16:26, prior to the start of the procedure. A Moderate amount of bleeding was controlled with Pressure. The procedure was tolerated well with a pain level  of 0 throughout and a pain level of 0 following the procedure. Post Debridement Measurements: 4cm length x 3.5cm width x 0.2cm depth; 2.199cm^3 volume. Character of Wound/Ulcer Post Debridement is improved. Severity of Tissue Post Debridement is: Fat layer exposed. Post procedure Diagnosis Wound #8: Same as Pre-Procedure Plan Follow-up Appointments: Return Appointment in 1 week. Dressing Change Frequency: Do not change entire dressing for one week. Wound Cleansing: Clean wound with Normal Saline. May shower and wash wound with soap and water. Primary Wound Dressing: Wound #4 Right,Distal,Plantar Foot: Calcium Alginate with Silver Wound #6 Right Metatarsal head second: Calcium Alginate with Silver Wound #7 Right,Proximal,Plantar Foot: Calcium Alginate with Silver Wound #8 Right,Anterior Lower Leg: Iodoflex Secondary Dressing: Dry Gauze ABD pad Drawtex Edema Control: 3 Layer Compression System - Right Lower Extremity Avoid standing for long periods of time Elevate legs to the level of the heart or above for 30 minutes daily and/or when sitting, a frequency of: - 3 to 4 times a day throughout the day. Off-Loading: Open toe surgical shoe to: - on right foot. 1. Silver alginate to the right second  metatarsal head and right anterior lower leg and right midfoot 2. Still looking for an appointment with Dr. Sharol Given we still have to deal with the abscess and the underlying osteomyelitis. Electronic Signature(s) Signed: 04/24/2019 5:53:46 PM By: Linton Ham MD Entered By: Linton Ham on 04/24/2019 16:36:23 -------------------------------------------------------------------------------- SuperBill Details Patient Name: Date of Service: Lindsey Murillo 04/24/2019 Medical Record [id number] Patient Account Number: [id number] Date of Birth/Sex: Treating RN: [birth year] (23 y.o. F) Primary Care Provider: Baltazar Apo Other Clinician: Referring Provider: Treating Provider/Extender:Abrial Arrighi, Evlyn Kanner, [place] Dom in Treatment: 5 Diagnosis Coding ICD-10 Codes Code Description E10.621 Type 1 diabetes mellitus with foot ulcer M86.471 Chronic osteomyelitis with draining sinus, right ankle and foot E10.42 Type 1 diabetes mellitus with diabetic polyneuropathy L97.518 Non-pressure chronic ulcer of other part of right foot with other specified severity L97.512 Non-pressure chronic ulcer of other part of right foot with fat layer exposed L97.811 Non-pressure chronic ulcer of other part of right lower leg limited to breakdown of skin I89.0 Lymphedema, not elsewhere classified Facility Procedures CPT4 Code Description: [id number] 11042 - DEB SUBQ TISSUE 20 SQ CM/< ICD-10 Diagnosis Description L97.811 Non-pressure chronic ulcer of other part of right lower leg skin Modifier: limited to bre Quantity: 1 akdown of Physician Procedures CPT4 Code Description: [id number] 11042 - WC PHYS SUBQ TISS 20 SQ CM ICD-10 Diagnosis Description L97.811 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

## 2019-04-26 NOTE — Progress Notes (Signed)
Lindsey Murillo, Lindsey Murillo ([id number]) Visit Report for 04/24/2019 Arrival Information Details Patient Name: Date of Service: Lindsey Murillo, Lindsey Murillo 04/24/2019 3:00 PM Medical Record [id number] Patient Account Number: [id number] Date of Birth/Sex: Treating RN: May 12, 1995 (23 y.o. Nancy Fetter Primary Care Milessa Hogan: Baltazar Apo Other Clinician: Referring Kaeya Schiffer: Treating Sevyn Paredez/Extender:Robson, Evlyn Kanner, [place] Dom in Treatment: 5 Visit Information History Since Last Visit Added or deleted any medications: No Patient Arrived: Ambulatory Any new allergies or adverse reactions: No Arrival Time: 15:55 Had a fall or experienced change in No Accompanied By: alone activities of daily living that may affect Transfer Assistance: None risk of falls: Patient Identification Verified: Yes Signs or symptoms of abuse/neglect since last No Secondary Verification Process Completed: Yes visito Patient Requires Transmission-Based No Hospitalized since last visit: No Precautions: Implantable device outside of the clinic excluding No Patient Has Alerts: No cellular tissue based products placed in the center since last visit: Has Dressing in Place as Prescribed: Yes Has Compression in Place as Prescribed: Yes Pain Present Now: No Electronic Signature(s) Signed: 04/26/2019 5:32:06 PM By: Levan Hurst RN, BSN Entered By: Levan Hurst on 04/24/2019 15:56:03 -------------------------------------------------------------------------------- Encounter Discharge Information Details Patient Name: Date of Service: Lindsey Murillo. 04/24/2019 3:00 PM Medical Record [id number] Patient Account Number: [id number] Date of Birth/Sex: Treating RN: [birth year] (23 y.o. Clearnce Sorrel Primary Care Milicent Acheampong: Baltazar Apo Other Clinician: Referring Wladyslaw Henrichs: Treating Maahir Horst/Extender:Robson, Evlyn Kanner, [place] Dom in Treatment: 5 Encounter Discharge  Information Items Post Procedure Vitals Discharge Condition: Stable Temperature (F): 98.5 Ambulatory Status: Ambulatory Pulse (bpm): 115 Discharge Destination: Home Respiratory Rate (breaths/min): 16 Transportation: Private Auto Blood Pressure (mmHg): 137/101 Accompanied By: self Schedule Follow-up Appointment: Yes Clinical Summary of Care: Patient Declined Electronic Signature(s) Signed: 04/26/2019 5:33:40 PM By: Kela Millin Entered By: Kela Millin on 04/24/2019 17:53:35 -------------------------------------------------------------------------------- Lower Extremity Assessment Details Patient Name: Date of Service: Lindsey Murillo, Lindsey Murillo 04/24/2019 3:00 PM Medical Record [id number] Patient Account Number: [id number] Date of Birth/Sex: Treating RN: June 03, 1995 (23 y.o. Nancy Fetter Primary Care Halsey Hammen: Baltazar Apo Other Clinician: Referring Heavenleigh Petruzzi: Treating Naima Veldhuizen/Extender:Robson, Evlyn Kanner, [place] Dom in Treatment: 5 Edema Assessment Assessed: [Left: No] [Right: No] Edema: [Left: No] [Right: Yes] Calf Left: Right: Point of Measurement: cm From Medial Instep 38 cm 31 cm Ankle Left: Right: Point of Measurement: cm From Medial Instep 28 cm 23 cm Vascular Assessment Pulses: Dorsalis Pedis Palpable: [Right:Yes] Electronic Signature(s) Signed: 04/26/2019 5:32:06 PM By: Levan Hurst RN, BSN Entered By: Levan Hurst on 04/24/2019 16:05:57 -------------------------------------------------------------------------------- Multi Wound Chart Details Patient Name: Date of Service: Lindsey Murillo. 04/24/2019 3:00 PM Medical Record [id number] Patient Account Number: [id number] Date of Birth/Sex: Treating RN: 07/01/1995 (23 y.o. 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] [6:Lower Extremity] [7:Lower Extremity] Secondary Etiology: [4:N/A] [6:N/A] [7:N/A] Comorbid History: [4:Anemia, Type I Diabetes Anemia, Type I Diabetes Anemia, Type I Diabetes] Date Acquired: [4:01/09/2019] [6:01/09/2019] [7:01/09/2019] Weeks of Treatment: [4:5] [6:5] [7:5] Wound Status: [4:Open] [6:Healed - Epithelialized] [7:Open] Measurements L x W x D 0.6x1x1.6 [6:0x0x0] [7:0.2x0.3x0.6] (cm) Area (cm) : [4:0.471] [6:0] [  7:0.047] Volume (cm) : [4:0.754] [6:0] [7:0.028] % Reduction in Area: [4:91.40%] [6:100.00%] [7:89.30%] % Reduction in Volume: 31.50% [6:100.00%] [7:90.90%] Position 1 (o'clock): 9 Maximum Distance 1 [4:4] (cm): Starting Position 1 [7:3] (o'clock): Ending Position 1 [7:6] (o'clock): Maximum Distance 1 [7:0.7] (cm): Tunneling: [4:Yes] [6:No] [7:No] Undermining: [4:No] [6:No] [7:Yes] Classification: [4:Grade 3] [6:Grade 1] [7:Grade 2] Wagner Verification: [4:MRI] [6:N/A] [7:N/A] Exudate Amount: [4:Medium] [6:None Present] [7:Small] Exudate Type: [4:Purulent] [6:N/A] [7:Serous] Exudate Color: [4:yellow, brown, green] [6:N/A] [7:amber] Wound Margin: [4:Well defined, not attached Thickened] [7:Well defined, not attached] Granulation Amount: [4:Small (1-33%)] [6:None Present (0%)] [7:Small (1-33%)] Granulation Quality: [4:Pink, Pale] [6:N/A] [7:Pink, Pale] Necrotic Amount: [4:Large (67-100%)] [6:None Present (0%)] [7:Large (67-100%)] Exposed Structures: [4:Fat  Layer (Subcutaneous Fascia: No Tissue) Exposed: Yes Fascia: No Tendon: No Muscle: No Joint: No Bone: No] [6:Fat Layer (Subcutaneous Tissue) Exposed: Yes Tissue) Exposed: No Tendon: No Muscle: No Joint: No Bone: No] [7:Fat Layer (Subcutaneous  Fascia: No Tendon: No Muscle: No Joint: No Bone: No] Epithelialization: [4:Small (1-33%)] [6:Large (67-100%)] [7:Small (1-33%)] Debridement: [4:N/A] [6:N/A N/A] Pain Control: [4:N/A] [6:N/A N/A] Tissue Debrided: [4:N/A] [6:N/A N/A] Level: [4:N/A] [6:N/A N/A] Debridement Area (sq cm):N/A [6:N/A N/A] Instrument: [4:N/A] [6:N/A N/A] Bleeding: [4:N/A] [6:N/A N/A] Hemostasis Achieved: [4:N/A] [6:N/A N/A] Procedural Pain: [4:N/A] [6:N/A N/A] Post Procedural Pain: [4:N/A] [6:N/A N/A] Debridement Treatment N/A [6:N/A N/A] Response: Post Debridement [4:N/A] [6:N/A N/A] Measurements L x W x D (cm) Post Debridement [4:N/A] [6:N/A N/A] Volume: (cm) Procedures Performed: N/A [4:8] [6:N/A N/A N/A] [7:N/A] Photos: [4:No Photos] [6:N/A N/A] Wound Location: [4:Right Lower Leg - Anterior N/A] [6:N/A] Wounding Event: [4:Blister] [6:N/A N/A] Primary Etiology: [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] [6:N/A] Secondary Etiology: [4:Lymphedema] [6:N/A N/A] Comorbid History: [4:Anemia, Type I Diabetes N/A] [6:N/A] Date Acquired: [4:03/18/2019] [6:N/A N/A] Weeks of Treatment: [4:5] [6:N/A N/A] Wound Status: [4:Open] [6:N/A N/A] Measurements L x W x D 4x3.5x0.2 [6:N/A N/A] (cm) Area (cm) : [4:10.996] [6:N/A N/A] Volume (cm) : [4:2.199] [6:N/A N/A] % Reduction in Area: [4:-88.20%] [6:N/A N/A] % Reduction in Volume: -276.50% [6:N/A N/A] Tunneling: [4:No] [6:N/A N/A] Undermining: [4:No] [6:N/A N/A] Classification: [4:Grade 1] [6:N/A N/A] Wagner Verification: [4:N/A] [6:N/A N/A] Exudate Amount: [4:Medium] [6:N/A N/A] Exudate Type: [4:Serosanguineous] [6:N/A N/A] Exudate Color: [4:red, brown] [6:N/A N/A] Wound Margin: [4:Flat and Intact] [6:N/A  N/A] Granulation Amount: [4:Small (1-33%)] [6:N/A N/A] Granulation Quality: [4:Red] [6:N/A N/A] Necrotic Amount: [4:Large (67-100%)] [6:N/A N/A] Exposed Structures: [4:Fat Layer (Subcutaneous N/A Tissue) Exposed: Yes Fascia: No Tendon: No Muscle: No Joint: No Bone: No] [6:N/A] Epithelialization: [4:Small (1-33%)] [6:N/A N/A] Debridement: [4:Debridement - Excisional N/A] [6:N/A] Pre-procedure [4:16:26] [6:N/A N/A] Verification/Time Out Taken: Pain Control: [4:Lidocaine 5% topical ointment] [6:N/A N/A] Tissue Debrided: [4:Subcutaneous, Slough] [6:N/A] [7:N/A] Level: [4:Skin/Subcutaneous Tissue] [6:N/A] [7:N/A] Debridement Area (sq cm):14 [6:N/A] [7:N/A] Instrument: [4:Curette] [6:N/A] [7:N/A] Bleeding: [4:Moderate] [6:N/A] [7:N/A] Hemostasis Achieved: [4:Pressure] [6:N/A] [7:N/A] Procedural Pain: [4:0] [6:N/A] [7:N/A] Post Procedural Pain: [4:0] [6:N/A] [7:N/A] Debridement Treatment Procedure was tolerated [6:N/A] [7:N/A] Response: [4:well] Post Debridement [4:4x3.5x0.2] [6:N/A] [7:N/A] Measurements L x W x D (cm) Post Debridement [4:2.199] [6:N/A] [7:N/A] Volume: (cm) Procedures Performed: Debridement [6:N/A] [7:N/A] Treatment Notes Electronic Signature(s) Signed: 04/24/2019 5:53:46 PM By: Linton Ham MD Entered By: Linton Ham on 04/24/2019 16:28:41 -------------------------------------------------------------------------------- Multi-Disciplinary Care Plan Details Patient Name: Date of Service: Lindsey Murillo. 04/24/2019 3:00 PM Medical Record [id number] Patient Account Number: [id number] Date of Birth/Sex: Treating RN: Mar 02, 1996 (23 y.o. Orvan Falconer Primary Care Emira Eubanks: Baltazar Apo Other Clinician: Referring Borghild Thaker: Treating Rasheida Broden/Extender:Robson, Evlyn Kanner, [place] Dom  in Treatment: 5 Active Inactive Wound/Skin Impairment Nursing Diagnoses: Knowledge deficit related to ulceration/compromised skin  integrity Goals: Patient/caregiver will verbalize understanding of skin care regimen Date Initiated: 03/20/2019 Target Resolution Date: 05/25/2019 Goal Status: Active Ulcer/skin breakdown will have a volume reduction of 30% by week 4 Target Resolution Date Initiated: 03/20/2019 Date Inactivated: 04/24/2019 Date: 04/20/2019 Unmet Goal Status: Unmet Reason: COMORBITIES/ NON COMPLAINCE Ulcer/skin breakdown will have a volume reduction of 50% by week 8 Date Initiated: 04/24/2019 Target Resolution Date: 05/25/2019 Goal Status: Active Interventions: Assess patient/caregiver ability to obtain necessary supplies Assess patient/caregiver ability to perform ulcer/skin care regimen upon admission and as needed Assess ulceration(s) every visit Notes: Electronic Signature(s) Signed: 04/25/2019 9:49:12 AM By: Carlene Coria RN Entered By: Carlene Coria on 04/24/2019 15:56:33 -------------------------------------------------------------------------------- Pain Assessment Details Patient Name: Date of Service: Lindsey Murillo, Lindsey Murillo 04/24/2019 3:00 PM Medical Record [id number] Patient Account Number: [id number] Date of Birth/Sex: Treating RN: 06/30/1995 (23 y.o. Nancy Fetter Primary Care Diera Wirkkala: Baltazar Apo Other Clinician: Referring Raziah Funnell: Treating Elisama Thissen/Extender:Robson, Evlyn Kanner, [place] Dom in Treatment: 5 Active Problems Location of Pain Severity and Description of Pain Patient Has Paino No Site Locations Pain Management and Medication Current Pain Management: Electronic Signature(s) Signed: 04/26/2019 5:32:06 PM By: Levan Hurst RN, BSN Entered By: Levan Hurst on 04/24/2019 15:57:19 -------------------------------------------------------------------------------- Patient/Caregiver Education Details Lindsey Murillo, Lindsey Murillo 12/15/2020andnbsp3:00 Patient Name: Date of Service: [name] Medical Record Patient Account Number:  [id number] Number: Treating RN: Carlene Coria Date of Birth/Gender: May 01, 1996 (23 y.o. F) Other Clinician: Primary Care Physician:LUKING, Loralyn Freshwater Linton Ham Referring Physician: Physician/Extender: Marylin Crosby in Treatment: 5 Education Assessment Education Provided To: Patient Education Topics Provided Wound/Skin Impairment: Methods: Explain/Verbal Responses: State content correctly Electronic Signature(s) Signed: 04/25/2019 9:49:12 AM By: Carlene Coria RN Entered By: Carlene Coria on 04/24/2019 15:56:50 -------------------------------------------------------------------------------- Wound Assessment Details Patient Name: Date of Service: Lindsey Murillo, Lindsey Murillo 04/24/2019 3:00 PM Medical Record [id number] Patient Account Number: [id number] Date of Birth/Sex: Treating RN: 12-10-1995 (23 y.o. Nancy Fetter Primary Care Madeleyn Schwimmer: Baltazar Apo Other Clinician: Referring Pattijo Juste: Treating Angelos Wasco/Extender:Robson, Evlyn Kanner, [place] Dom in Treatment: 5 Wound Status Wound Number: 4 Primary Diabetic Wound/Ulcer of the Lower Etiology: Extremity Wound Location: Right Foot - Plantar, Distal Wound Status: Open Wounding Event: Gradually Appeared Comorbid Anemia, Type I Diabetes Date Acquired: 01/09/2019 History: Weeks Of Treatment: 5 Clustered Wound: No Photos Wound Measurements Length: (cm) 0.6 % Reduction Width: (cm) 1 % Reduction Depth: (cm) 1.6 Epitheliali Area: (cm) 0.471 Tunneling: Volume: (cm) 0.754 Positio Maximum in Area: 91.4% in Volume: 31.5% zation: Small (1-33%) Yes n (o'clock): 9 Distance: (cm) 4 Undermining: No Wound Description Classification: Grade 3 Foul Odor A Wound Margin: Well defined, not attached Slough/Fibr Exudate Amount: Medium Exudate Type: Purulent Exudate Color: yellow, brown, green Wound Bed Granulation Amount: Small (1-33%) Granulation Quality: Pink, Pale Fascia Expo Necrotic  Amount: Large (67-100%) Fat Layer ( Necrotic Quality: Adherent Slough Tendon Expo Muscle Expo Joint Expos Bone Expose fter Cleansing: No ino Yes Exposed Structure sed: No Subcutaneous Tissue) Exposed: Yes sed: No sed: No ed: No d: No Treatment Notes Wound #4 (Right, Distal, Plantar Foot) 1. Cleanse With Wound Cleanser Soap and water 2. Periwound Care Moisturizing lotion 3. Primary Dressing Applied Calcium Alginate Ag Iodoflex 4. Secondary Dressing ABD Pad Dry Gauze Kerramax/Xtrasorb Drawtex 6. Support Layer Applied 3 layer compression wrap Notes silver alginate to foot and iodoflex to leg Electronic Signature(s) Signed: 04/26/2019 3:39:50 PM By: Mikeal Hawthorne EMT/HBOT Signed: 04/26/2019 5:32:06 PM By: Levan Hurst RN, BSN  Entered By: Mikeal Hawthorne on 04/26/2019 14:45:44 -------------------------------------------------------------------------------- Wound Assessment Details Patient Name: Date of Service: Lindsey Murillo, Lindsey Murillo 04/24/2019 3:00 PM Medical Record [id number] Patient Account Number: [id number] Date of Birth/Sex: Treating RN: [birth year] (23 y.o. Nancy Fetter Primary Care Nagee Goates: Baltazar Apo Other Clinician: Referring Ronte Parker: Treating Lulia Schriner/Extender:Robson, Evlyn Kanner, [place] Dom in Treatment: 5 Wound Status Wound Number: 6 Primary Diabetic Wound/Ulcer of the Lower Etiology: Extremity Wound Location: Right Metatarsal head second Wound Status: Healed - Epithelialized Wounding Event: Gradually Appeared Comorbid Anemia, Type I Diabetes Date Acquired: 01/09/2019 History: Weeks Of Treatment: 5 Clustered Wound: No Photos Wound Measurements Length: (cm) 0 % Reduction Width: (cm) 0 % Reduction Depth: (cm) 0 Epitheliali Area: (cm) 0 Tunneling: Volume: (cm) 0 Underminin Wound Description Classification: Grade 1 Foul Odor A Wound Margin: Thickened Slough/Fibr Exudate Amount: None Present Wound Bed Granulation  Amount: None Present (0%) Necrotic Amount: None Present (0%) Fascia Expo Fat Layer ( Tendon Expo Muscle Expo Joint Expos Bone Expose Electronic Signature(s) Signed: 04/26/2019 3:39:50 PM By: Mikeal Hawthorne EMT/HBOT Signed: 04/26/2019 5:32:06 PM By: Levan Hurst RN, BSN Entered By: Mikeal Hawthorne on 12/17 fter Cleansing: No ino No Exposed Structure sed: No Subcutaneous Tissue) Exposed: No sed: No sed: No ed: No d: No /2020 14:45:24 in Area: 100% in Volume: 100% zation: Large (67-100%) No g: No -------------------------------------------------------------------------------- Wound Assessment Details Patient Name: Date of Service: Lindsey Murillo, Lindsey Murillo 04/24/2019 3:00 PM Medical Record [id number] Patient Account Number: [id number] Date of Birth/Sex: Treating RN: [birth year] (23 y.o. Nancy Fetter Primary Care Jericca Russett: Baltazar Apo Other Clinician: Referring Alexy Heldt: Treating Cully Luckow/Extender:Robson, Evlyn Kanner, [place] Dom in Treatment: 5 Wound Status Wound Number: 7 Primary Diabetic Wound/Ulcer of the Lower Etiology: Extremity Wound Location: Right Foot - Plantar, Proximal Wound Status: Open Wounding Event: Gradually Appeared Comorbid Anemia, Type I Diabetes Date Acquired: 01/09/2019 History: Weeks Of Treatment: 5 Clustered Wound: No Photos Wound Measurements Length: (cm) 0.2 Width: (cm) 0.3 Depth: (cm) 0.6 Area: (cm) 0.047 Volume: (cm) 0.028 % Reduction in Area: 89.3% % Reduction in Volume: 90.9% Epithelialization: Small (1-33%) Tunneling: No Undermining: Yes Starting Position (o'clock): 3 Ending Position (o'clock): 6 Maximum Distance: (cm) 0.7 Wound Description Classification: Grade 2 Foul Odor Wound Margin: Well defined, not attached Slough/Fib Exudate Amount: Small Exudate Type: Serous Exudate Color: amber Wound Bed Granulation Amount: Small (1-33%) Granulation Quality: Pink, Pale Fascia Necrotic Amount: Large (67-100%)  Fat La Necrotic Quality: Adherent Slough Tendon Muscle Joint Bone E After Cleansing: No rino Yes Exposed Structure Exposed: No yer (Subcutaneous Tissue) Exposed: Yes Exposed: No Exposed: No Exposed: No xposed: No Treatment Notes Wound #7 (Right, Proximal, Plantar Foot) 1. Cleanse With Wound Cleanser Soap and water 2. Periwound Care Moisturizing lotion 3. Primary Dressing Applied Calcium Alginate Ag Iodoflex 4. Secondary Dressing ABD Pad Dry Gauze Kerramax/Xtrasorb Drawtex 6. Support Layer Applied 3 layer compression wrap Notes silver alginate to foot and iodoflex to leg Electronic Signature(s) Signed: 04/26/2019 3:39:50 PM By: Mikeal Hawthorne EMT/HBOT Signed: 04/26/2019 5:32:06 PM By: Levan Hurst RN, BSN Entered By: Mikeal Hawthorne on 04/26/2019 14:46:22 -------------------------------------------------------------------------------- Wound Assessment Details Patient Name: Date of Service: Lindsey Murillo. 04/24/2019 3:00 PM Medical Record [id number] Patient Account Number: [id number] Date of Birth/Sex: Treating RN: 08-Jun-1995 (23 y.o. Nancy Fetter Primary Care Beanca Kiester: Baltazar Apo Other Clinician: Referring Evalin Shawhan: Treating Maelin Kurkowski/Extender:Robson, Evlyn Kanner, [place] Dom in Treatment: 5 Wound Status Wound Number: 8 Primary Etiology: Diabetic Wound/Ulcer of the Lower Extremity Wound Location: Right Lower Leg - Anterior Secondary Lymphedema Wounding Event: Blister Etiology:  Date Acquired: 03/18/2019 Wound Status: Open Weeks Of Treatment: 5 Comorbid Anemia, Type I Diabetes Clustered Wound: No History: Photos Wound Measurements Length: (cm) 4 % Redu Width: (cm) 3.5 % Redu Depth: (cm) 0.2 Epithe Area: (cm) 10.996 Tunne Volume: (cm) 2.199 Under Wound Description Classification: Grade 1 Wound Margin: Flat and Intact Exudate Amount: Medium Exudate Type: Serosanguineous Exudate Color: red, brown Wound Bed Granulation  Amount: Small (1-33%) Granulation Quality: Red Necrotic Amount: Large (67-100%) Necrotic Quality: Adherent Slough Foul Odor After Cleansing: No Slough/Fibrino No Exposed Structure Fascia Exposed: No Fat Layer (Subcutaneous Tissue) Exposed: Yes Tendon Exposed: No Muscle Exposed: No Joint Exposed: No Bone Exposed: No ction in Area: -88.2% ction in Volume: -276.5% lialization: Small (1-33%) ling: No mining: No Treatment Notes Wound #8 (Right, Anterior Lower Leg) 1. Cleanse With Wound Cleanser Soap and water 2. Periwound Care Moisturizing lotion 3. Primary Dressing Applied Calcium Alginate Ag Iodoflex 4. Secondary Dressing ABD Pad Dry Gauze Kerramax/Xtrasorb Drawtex 6. Support Layer Applied 3 layer compression wrap Notes silver alginate to foot and iodoflex to leg Electronic Signature(s) Signed: 04/26/2019 3:39:50 PM By: Mikeal Hawthorne EMT/HBOT Signed: 04/26/2019 5:32:06 PM By: Levan Hurst RN, BSN Entered By: Mikeal Hawthorne on 04/26/2019 14:46:51 -------------------------------------------------------------------------------- Vitals Details Patient Name: Date of Service: Lindsey Murillo. 04/24/2019 3:00 PM Medical Record [id number] Patient Account Number: [id number] Date of Birth/Sex: Treating RN: [birth year] (23 y.o. Nancy Fetter Primary Care Jakari Sada: Baltazar Apo Other Clinician: Referring Rawson Minix: Treating Winton Offord/Extender:Robson, Evlyn Kanner, [place] Dom in Treatment: 5 Vital Signs Time Taken: 15:55 Temperature (F): 98.5 Height (in): 68 Pulse (bpm): 115 Weight (lbs): 137 Respiratory Rate (breaths/min): 16 Body Mass Index (BMI): 20.8 Blood Pressure (mmHg): 137/101 Reference Range: 80 - 120 mg / dl Electronic Signature(s) Signed: 04/26/2019 5:32:06 PM By: Levan Hurst RN, BSN Entered By: Levan Hurst on 04/24/2019 15:57:14

## 2019-05-01 ENCOUNTER — Other Ambulatory Visit: Payer: Self-pay

## 2019-05-01 ENCOUNTER — Encounter (HOSPITAL_BASED_OUTPATIENT_CLINIC_OR_DEPARTMENT_OTHER): Payer: PRIVATE HEALTH INSURANCE | Admitting: Internal Medicine

## 2019-05-01 DIAGNOSIS — E10621 Type 1 diabetes mellitus with foot ulcer: Secondary | ICD-10-CM | POA: Diagnosis not present

## 2019-05-01 NOTE — Progress Notes (Signed)
SELENE, PELTZER ([id number]) Visit Report for 05/01/2019 HPI Details Patient Name: Date of Service: Lindsey Murillo, Lindsey Murillo 05/01/2019 3:30 PM Medical Record [id number] Patient Account Number: [id number] Date of Birth/Sex: Treating RN: Feb 27, 1996 (23 y.o. F) Primary Care Provider: Baltazar Apo Other Clinician: Referring Provider: Treating Provider/Extender:Kurt Hoffmeier, Evlyn Kanner, [place] Dom in Treatment: 6 History of Present Illness HPI Description: 06/16/2018 ADMISSION This is a 23 year old type I diabetic patient. She was referred by her family physician Dr. Wolfgang Phoenix in [place] where they live. She had been seen in the urgent care at [facility] on 05/27/2018 with a large bulla on the right dorsal foot. An x-ray of the foot showed swelling of the base of her first toe and first MTP with no soft tissue gas or bony erosion. She came to the primary physician's attention in follow-up on 06/09/2018 noted to have a diabetic foot ulcer. She was advised to take work off. She has been using peroxide and Neosporin to the wound. Past medical history; the patient does not have a prior history of diabetic foot ulcers. She is listed as having both peripheral neuropathy and autonomic neuropathy. Chronic diarrhea thoracic back pain and anemia. ABIs in our clinic were noncompressible on the right 2/11; follow-up for this badly infected wound that we admitted to the clinic on 2/7. The area in question is on the right first metatarsal head. When she arrived in clinic last week there was swelling and erythema in the foot as well as on the lateral part of the first metatarsal head. Plain x-rays showed soft tissue wound with soft tissue gas noted overlying the first MTP no radiographic changes of osteomyelitis. The culture showed abundant group B strep. The Augmentin that I chose empirically last time clearly the right choice. She is offloading this in a healing sandal. She is not working. She  states her diabetes is under reasonably good control 2/14; continued follow-up. The area in question which is on the right first metatarsal head looks improved although still requiring debridement were using silver alginate. She is completing 10 days of Augmentin which she has tolerated well. According to the patient she is offloading this quite aggressively. She is still in a surgical shoe 2/21; continued follow-up. Right first metatarsal head generally improved but still requiring debridement there is less swelling and no erythema. She is offloading this in a surgical shoe. 2/28; right first metatarsal head generally improved but still requiring debridement we have been using Hydrofera Blue she is using a surgical shoe but I changed her to a Darco forefoot off loader today. Possible she will ultimately require a total contact cast 3/6 right first metatarsal head generally improved there is less adherent debris and generally washes off nicely with Anasept and gauze. Using Hydrofera Blue and a Darco. We are going to consider putting her in a total contact cast today however we did not have sufficient supplies 3/13; our intake nurse reported serosanguineous drainage which was concerning. The patient denies any systemic symptoms we have been using Hydrofera Blue in today with anticipation of putting on a total contact cast. However we also noted swelling around the wound. 3/20 -Right first metatarsal head wound appears granulating, without areas of necrosis or debris surrounding skin appears good, she is completed or almost completed her Keflex course. We will continue with the alginate and she is doing the offloading with the surgical shoe and felt. 3/27; right first metatarsal head. There is no evidence of infection currently. She has senescent looking wound edges and  some debris on the wound surface but in general things look quite a bit better today. We have been using alginate and offloading  her with a forefoot off loader. I am not going to consider total contact casting her in this environment until I can be certain with that the clinic will be open to deal with the obligatory first cast change in the routine changing 4/3; right first metatarsal head. No evidence of infection. The tissue looks healthy although there is some tunneling laterally towards the mid part of the foot. We are using silver alginate, I have changed that the silver collagen today. 4/9 right first metatarsal head. No evidence of infection. Using silver collagen. 4/17 right first metatarsal head the wound is actually larger today and I think deeper. There is no evidence of recurrent infection. 4/20; back for her obligatory first total contact cast change. The wound on her foot actually looks quite good on the right first metatarsal head however she clearly has a cast injury at the superior margin of the cast just at the folded level this was in spite of padding this area. 4/24; wound on the right first metatarsal head looks better. The cast injury wounds on the anterior tibial area also look better. 5/1 wound on the right first metatarsal head is closed over. The cost injury wounds on the anterior tibial are also closed. The patient has type 1 diabetes with polyneuropathy. I do not think she has the financial means for diabetic shoes with custom inserts READMISSION 03/20/2019 This is a patient with type 1 diabetes and polyneuropathy. She was seen earlier this year with a wound on the right first metatarsal head the closed over. We recommended that she offload this in her shoes. I do not believe that was done but in any case her wounds reopened sometime in October. She was seen in the ER on 10/20 with a diabetic foot ulcer. Right foot was swollen and erythematous. She was put on antibiotics although at the time of this dictation I am not exactly sure which one. An x-ray was done that showed no osteo-. She has  been soaking her foot in Epson salts and applying dry gauze. She is still working at [employer]. She has 4 open areas; one at the base of the second through fourth toes. One at the first met head 1 over the second met head and 1 over the midfoot. The second met head wound probes proximally towards her midfoot and midfoot wound probes superiorly although I could not connect these 2 wounds. There is no purulent drainage. A considerable amount of denuded skin over this area which I think is a remanent of cellulitis although the medial part of her foot is still erythematous there is no tenderness. For the most part I think this looks like treated cellulitis from her antibiotics in October. She has a superficial area of epithelial loss on the right lower mid tibia Past medical history; patient is a type I diabetic with polyneuropathy. She is not felt to have PAD.. She is unaware of her hemoglobin A1c she is supposed to see an endocrinologist this afternoon. 11/19; MRI is tomorrow. Culture I did last time showed group G strep. This would have been covered by the empiric Augmentin I gave her when I first saw her. She should have 3 more days of this I am going to extend this by another 4 days until we have time to digest the MRI. If she has osteomyelitis she is going to   need to see infectious disease for IV antibiotics. She has not been systemically unwell. She is tolerating the Augmentin well 12/1; the patient comes in today stating that her MRI that was supposed to be done on 11/20 was canceled by the hospital. She does not have an appointment. She has completed the Augmentin I gave her for the culture showing group G strep. Paradoxically she has less of an open area on the proximal foot just above her plantar metatarsals the midfoot also seems to be probing less and the left met head is closed. She also has a fairly extensive area on the right mid tibia in the setting of what looks to be lymphedema.  According to our records this was present on admission but I do not think I looked at this specifically the last time she was here. 12/8; I finally had to send this patient to the ER in order to get an MRI performed. The MRI showed findings consistent with osteomyelitis in the head and neck of the fifth metatarsal and the base and proximal phalanx of the fifth toe. There is also an abscess in the subcutaneous tissues dorsal to the fifth MTP and proximal phalanx of the little toe. Also noted that there is an abscess in the plantar soft tissues of the foot just proximal to the toes which extends from the great toe to the fourth toe. They did do an MRI of the tib-fib which was negative for underlying bone issues. Culture I did last week showed abundant Serratia and moderate group B strep They did not admit her last week from the ER which to me was surprising. She has purulent drainage coming out of the small wound at roughly the third met head. Erythema and swelling of the forefoot. She continues to have a large wound on the anterior mid tibia on the right 12/15; the patient still has the 2 small open areas over the second met head. Still draining purulent drainage although the entire foot looks less erythematous and less swollen. She still has the small area in the midfoot She has lymphedema in the upper leg the area on the mid tibia still requiring debridement but looks better. 12/22; single wound in the area over the second met head. Still with a purulent drainage although less so. The entire foot looks less erythematous and less swollen. But both are still present. The area on the midfoot is smaller. We have been using silver alginate to both of these areas -The area on the anterior tibial area looks healthier. We are using Iodoflex here under compression She still does not have an appointment with Dr. Sharol Given. This is in spite of the fact that it was suggested she get one when she left the ER and  that we have been working on this for 2 weeks. Will call his office tomorrow Electronic Signature(s) Signed: 05/01/2019 6:32:44 PM By: Linton Ham MD Entered By: Linton Ham on 05/01/2019 18:02:43 -------------------------------------------------------------------------------- Physical Exam Details Patient Name: Date of Service: Lindsey Murillo, Lindsey Murillo 05/01/2019 3:30 PM Medical Record [id number] Patient Account Number: [id number] Date of Birth/Sex: Treating RN: 1996-02-10 (23 y.o. F) Primary Care Provider: Baltazar Apo Other Clinician: Referring Provider: Treating Provider/Extender:Lorrinda Ramstad, Evlyn Kanner, [place] Dom in Treatment: 6 Constitutional Patient is hypertensive.. Pulse regular and within target range for patient.Marland Kitchen Respirations regular, non-labored and within target range.. Temperature is normal and within the target range for the patient.Marland Kitchen Appears in no distress. Eyes Conjunctivae clear. No discharge.no icterus. Respiratory work of breathing is normal.  Cardiovascular Pedal pulses are palpable in the foot. Integumentary (Hair, Skin) There is still some erythema just above her met heads on the right.. Psychiatric appears at normal baseline. Notes Wound exam; second significant forefoot swelling is a lot better as is the erythema. She still has drainage coming out of this wound area. Again this looks improved this week. The open wound in the mid foot looks a lot better. And the area on the right mid tibia looks healthier. No debridement is required in any area. Electronic Signature(s) Signed: 05/01/2019 6:32:44 PM By: Linton Ham MD Entered By: Linton Ham on 05/01/2019 18:04:21 -------------------------------------------------------------------------------- Physician Orders Details Patient Name: Date of Service: [name] 05/01/2019 3:30 PM Medical Record [id number] Patient Account Number: [id number] Date of  Birth/Sex: Treating RN: [birth year] (23 y.o. Orvan Falconer Primary Care Provider: Baltazar Apo Other Clinician: Referring Provider: Treating Provider/Extender:Abshir Paolini, Evlyn Kanner, [place] Dom in Treatment: 6 Verbal / Phone Orders: No Diagnosis Coding ICD-10 Coding Code Description E10.621 Type 1 diabetes mellitus with foot ulcer M86.471 Chronic osteomyelitis with draining sinus, right ankle and foot E10.42 Type 1 diabetes mellitus with diabetic polyneuropathy L97.518 Non-pressure chronic ulcer of other part of right foot with other specified severity L97.512 Non-pressure chronic ulcer of other part of right foot with fat layer exposed L97.811 Non-pressure chronic ulcer of other part of right lower leg limited to breakdown of skin I89.0 Lymphedema, not elsewhere classified Follow-up Appointments Return Appointment in 1 week. Dressing Change Frequency Do not change entire dressing for one week. Wound Cleansing Clean wound with Normal Saline. May shower and wash wound with soap and water. Primary Wound Dressing Wound #4 Right,Distal,Plantar Foot Calcium Alginate with Silver Wound #7 Right,Proximal,Plantar Foot Calcium Alginate with Silver Wound #8 Right,Anterior Lower Leg Iodoflex Secondary Dressing Dry Gauze ABD pad Drawtex Edema Control 3 Layer Compression System - Right Lower Extremity Avoid standing for long periods of time Elevate legs to the level of the heart or above for 30 minutes daily and/or when sitting, a frequency of: - 3 to 4 times a day throughout the day. Off-Loading Open toe surgical shoe to: - on right foot. Patient Medications Allergies: No Known Allergies Notifications Medication Indication Start End cefdinir diabetic foot 05/01/2019 infection DOSE oral 300 mg capsule - 1 capsule oral bid for a further 10 days Electronic Signature(s) Signed: 05/01/2019 6:11:35 PM By: Linton Ham MD Entered By: Linton Ham on 05/01/2019  18:11:34 -------------------------------------------------------------------------------- Problem List Details Patient Name: Date of Service: [name] 05/01/2019 3:30 PM Medical Record [id number] Patient Account Number: [id number] Date of Birth/Sex: Treating RN: Jan 06, 1996 (23 y.o. Orvan Falconer Primary Care Provider: Baltazar Apo Other Clinician: Referring Provider: Treating Provider/Extender:Brier Firebaugh, Evlyn Kanner, [place] Dom in Treatment: 6 Active Problems ICD-10 Evaluated Encounter Code Description Active Date Today Diagnosis E10.621 Type 1 diabetes mellitus with foot ulcer 03/20/2019 No Yes M86.471 Chronic osteomyelitis with draining sinus, right ankle 04/17/2019 No Yes and foot E10.42 Type 1 diabetes mellitus with diabetic polyneuropathy 03/20/2019 No Yes L97.518 Non-pressure chronic ulcer of other part of right foot 03/20/2019 No Yes with other specified severity L97.512 Non-pressure chronic ulcer of other part of right foot 03/20/2019 No Yes with fat layer exposed L97.811 Non-pressure chronic ulcer of other part of right lower 03/20/2019 No Yes leg limited to breakdown of skin I89.0 Lymphedema, not elsewhere classified 03/20/2019 No Yes Inactive Problems ICD-10 Code Description Active Date Inactive Date L03.115 Cellulitis of right lower limb 03/20/2019 03/20/2019 Resolved Problems Electronic Signature(s) Signed: 05/01/2019 6:32:44 PM By: Dellia Nims,  Legrand Como MD Entered By: Linton Ham on 05/01/2019 17:54:03 -------------------------------------------------------------------------------- Progress Note Details Patient Name: Date of Service: Lindsey Murillo, Lindsey Murillo 05/01/2019 3:30 PM Medical Record [id number] Patient Account Number: [id number] Date of Birth/Sex: Treating RN: Aug 18, 1995 (23 y.o. F) Primary Care Provider: Baltazar Apo Other Clinician: Referring Provider: Treating Provider/Extender:Brier Firebaugh, Evlyn Kanner, [place] Dom  in Treatment: 6 Subjective History of Present Illness (HPI) 06/16/2018 ADMISSION This is a 23 year old type I diabetic patient. She was referred by her family physician Dr. Wolfgang Phoenix in [place] where they live. She had been seen in the urgent care at [facility] on 05/27/2018 with a large bulla on the right dorsal foot. An x-ray of the foot showed swelling of the base of her first toe and first MTP with no soft tissue gas or bony erosion. She came to the primary physician's attention in follow-up on 06/09/2018 noted to have a diabetic foot ulcer. She was advised to take work off. She has been using peroxide and Neosporin to the wound. Past medical history; the patient does not have a prior history of diabetic foot ulcers. She is listed as having both peripheral neuropathy and autonomic neuropathy. Chronic diarrhea thoracic back pain and anemia. ABIs in our clinic were noncompressible on the right 2/11; follow-up for this badly infected wound that we admitted to the clinic on 2/7. The area in question is on the right first metatarsal head. When she arrived in clinic last week there was swelling and erythema in the foot as well as on the lateral part of the first metatarsal head. Plain x-rays showed soft tissue wound with soft tissue gas noted overlying the first MTP no radiographic changes of osteomyelitis. The culture showed abundant group B strep. The Augmentin that I chose empirically last time clearly the right choice. She is offloading this in a healing sandal. She is not working. She states her diabetes is under reasonably good control 2/14; continued follow-up. The area in question which is on the right first metatarsal head looks improved although still requiring debridement were using silver alginate. She is completing 10 days of Augmentin which she has tolerated well. According to the patient she is offloading this quite aggressively. She is still in a surgical shoe 2/21; continued  follow-up. Right first metatarsal head generally improved but still requiring debridement there is less swelling and no erythema. She is offloading this in a surgical shoe. 2/28; right first metatarsal head generally improved but still requiring debridement we have been using Hydrofera Blue she is using a surgical shoe but I changed her to a Darco forefoot off loader today. Possible she will ultimately require a total contact cast 3/6 right first metatarsal head generally improved there is less adherent debris and generally washes off nicely with Anasept and gauze. Using Hydrofera Blue and a Darco. We are going to consider putting her in a total contact cast today however we did not have sufficient supplies 3/13; our intake nurse reported serosanguineous drainage which was concerning. The patient denies any systemic symptoms we have been using Hydrofera Blue in today with anticipation of putting on a total contact cast. However we also noted swelling around the wound. 3/20 -Right first metatarsal head wound appears granulating, without areas of necrosis or debris surrounding skin appears good, she is completed or almost completed her Keflex course. We will continue with the alginate and she is doing the offloading with the surgical shoe and felt. 3/27; right first metatarsal head. There is no evidence of infection currently. She has senescent  looking wound edges and some debris on the wound surface but in general things look quite a bit better today. We have been using alginate and offloading her with a forefoot off loader. I am not going to consider total contact casting her in this environment until I can be certain with that the clinic will be open to deal with the obligatory first cast change in the routine changing 4/3; right first metatarsal head. No evidence of infection. The tissue looks healthy although there is some tunneling laterally towards the mid part of the foot. We are using  silver alginate, I have changed that the silver collagen today. 4/9 right first metatarsal head. No evidence of infection. Using silver collagen. 4/17 right first metatarsal head the wound is actually larger today and I think deeper. There is no evidence of recurrent infection. 4/20; back for her obligatory first total contact cast change. The wound on her foot actually looks quite good on the right first metatarsal head however she clearly has a cast injury at the superior margin of the cast just at the folded level this was in spite of padding this area. 4/24; wound on the right first metatarsal head looks better. The cast injury wounds on the anterior tibial area also look better. 5/1 wound on the right first metatarsal head is closed over. The cost injury wounds on the anterior tibial are also closed. The patient has type 1 diabetes with polyneuropathy. I do not think she has the financial means for diabetic shoes with custom inserts READMISSION 03/20/2019 This is a patient with type 1 diabetes and polyneuropathy. She was seen earlier this year with a wound on the right first metatarsal head the closed over. We recommended that she offload this in her shoes. I do not believe that was done but in any case her wounds reopened sometime in October. She was seen in the ER on 10/20 with a diabetic foot ulcer. Right foot was swollen and erythematous. She was put on antibiotics although at the time of this dictation I am not exactly sure which one. An x-ray was done that showed no osteo-. She has been soaking her foot in Epson salts and applying dry gauze. She is still working at [employer]. She has 4 open areas; one at the base of the second through fourth toes. One at the first met head 1 over the second met head and 1 over the midfoot. The second met head wound probes proximally towards her midfoot and midfoot wound probes superiorly although I could not connect these 2 wounds. There is no  purulent drainage. A considerable amount of denuded skin over this area which I think is a remanent of cellulitis although the medial part of her foot is still erythematous there is no tenderness. For the most part I think this looks like treated cellulitis from her antibiotics in October. She has a superficial area of epithelial loss on the right lower mid tibia Past medical history; patient is a type I diabetic with polyneuropathy. She is not felt to have PAD.Marland Kitchen She is unaware of her hemoglobin A1c she is supposed to see an endocrinologist this afternoon. 11/19; MRI is tomorrow. Culture I did last time showed group G strep. This would have been covered by the empiric Augmentin I gave her when I first saw her. She should have 3 more days of this I am going to extend this by another 4 days until we have time to digest the MRI. If she has osteomyelitis  she is going to need to see infectious disease for IV antibiotics. She has not been systemically unwell. She is tolerating the Augmentin well 12/1; the patient comes in today stating that her MRI that was supposed to be done on 11/20 was canceled by the hospital. She does not have an appointment. She has completed the Augmentin I gave her for the culture showing group G strep. Paradoxically she has less of an open area on the proximal foot just above her plantar metatarsals the midfoot also seems to be probing less and the left met head is closed. She also has a fairly extensive area on the right mid tibia in the setting of what looks to be lymphedema. According to our records this was present on admission but I do not think I looked at this specifically the last time she was here. 12/8; I finally had to send this patient to the ER in order to get an MRI performed. The MRI showed findings consistent with osteomyelitis in the head and neck of the fifth metatarsal and the base and proximal phalanx of the fifth toe. There is also an abscess in the  subcutaneous tissues dorsal to the fifth MTP and proximal phalanx of the little toe. Also noted that there is an abscess in the plantar soft tissues of the foot just proximal to the toes which extends from the great toe to the fourth toe. They did do an MRI of the tib-fib which was negative for underlying bone issues. Culture I did last week showed abundant Serratia and moderate group B strep They did not admit her last week from the ER which to me was surprising. She has purulent drainage coming out of the small wound at roughly the third met head. Erythema and swelling of the forefoot. She continues to have a large wound on the anterior mid tibia on the right 12/15; the patient still has the 2 small open areas over the second met head. Still draining purulent drainage although the entire foot looks less erythematous and less swollen. She still has the small area in the midfoot She has lymphedema in the upper leg the area on the mid tibia still requiring debridement but looks better. 12/22; single wound in the area over the second met head. Still with a purulent drainage although less so. The entire foot looks less erythematous and less swollen. But both are still present. The area on the midfoot is smaller. We have been using silver alginate to both of these areas -The area on the anterior tibial area looks healthier. We are using Iodoflex here under compression She still does not have an appointment with Dr. Sharol Given. This is in spite of the fact that it was suggested she get one when she left the ER and that we have been working on this for 2 weeks. Will call his office tomorrow Objective Constitutional Patient is hypertensive.. Pulse regular and within target range for patient.Marland Kitchen Respirations regular, non-labored and within target range.. Temperature is normal and within the target range for the patient.Marland Kitchen Appears in no distress. Vitals Time Taken: 4:30 PM, Height: 68 in, Source: Stated, Weight:  137 lbs, Source: Stated, BMI: 20.8, Temperature: 99.9 F, Pulse: 126 bpm, Respiratory Rate: 18 breaths/min, Blood Pressure: 148/107 mmHg, Capillary Blood Glucose: 87 mg/dl. General Notes: glucose per pt report this am Eyes Conjunctivae clear. No discharge.no icterus. Respiratory work of breathing is normal. Cardiovascular Pedal pulses are palpable in the foot. Psychiatric appears at normal baseline. General Notes: Wound exam;  second significant forefoot swelling is a lot better as is the erythema. She still has drainage coming out of this wound area. Again this looks improved this week. The open wound in the mid foot looks a lot better. And the area on the right mid tibia looks healthier. No debridement is required in any area. Integumentary (Hair, Skin) There is still some erythema just above her met heads on the right.. Wound #4 status is Open. Original cause of wound was Gradually Appeared. The wound is located on the [place]. The wound measures 0.2cm length x 0.2cm width x 1.5cm depth; 0.031cm^2 area and 0.047cm^3 volume. There is Fat Layer (Subcutaneous Tissue) Exposed exposed. There is no undermining noted, however, there is tunneling at 9:00 with a maximum distance of 1.7cm. There is additional tunneling and at 3:00 with a maximum distance of 4.2cm. There is a medium amount of serosanguineous drainage noted. The wound margin is well defined and not attached to the wound base. There is small (1-33%) pink granulation within the wound bed. There is no necrotic tissue within the wound bed. Wound #7 status is Open. Original cause of wound was Gradually Appeared. The wound is located on the Right,Proximal,Plantar Foot. The wound measures 0.2cm length x 0.3cm width x 0.4cm depth; 0.047cm^2 area and 0.019cm^3 volume. There is Fat Layer (Subcutaneous Tissue) Exposed exposed. There is no undermining noted, however, there is tunneling at 12:00 with a maximum distance of  0.6cm. There is a small amount of serous drainage noted. The wound margin is well defined and not attached to the wound base. There is large (67-100%) red granulation within the wound bed. There is no necrotic tissue within the wound bed. Wound #8 status is Open. Original cause of wound was Blister. The wound is located on the Right,Anterior Lower Leg. The wound measures 3.1cm length x 3.5cm width x 0.1cm depth; 8.522cm^2 area and 0.852cm^3 volume. There is Fat Layer (Subcutaneous Tissue) Exposed exposed. There is no tunneling or undermining noted. There is a medium amount of serosanguineous drainage noted. The wound margin is flat and intact. There is large (67-100%) red granulation within the wound bed. There is a small (1-33%) amount of necrotic tissue within the wound bed including Adherent Slough. 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 There was a Three Layer Compression Therapy Procedure by Carlene Coria, RN. Post procedure Diagnosis Wound #4: Same as Pre-Procedure Wound #7 Pre-procedure diagnosis of Wound #7 is a Diabetic Wound/Ulcer of the Lower Extremity located on the Right,Proximal,Plantar Foot . There was a Three Layer Compression Therapy Procedure by Carlene Coria, RN. Post procedure Diagnosis Wound #7: Same as Pre-Procedure Wound #8 Pre-procedure diagnosis of Wound #8 is a Diabetic Wound/Ulcer of  the Lower Extremity located on the Right,Anterior Lower Leg . There was a Three Layer Compression Therapy Procedure by Carlene Coria, RN. Post procedure Diagnosis Wound #8: Same as Pre-Procedure Plan Follow-up Appointments: Return Appointment in 1 week. Dressing Change Frequency: Do not change entire dressing for one week.  Wound Cleansing: Clean wound with Normal Saline. May shower and wash wound with soap and water. Primary Wound Dressing: Wound #4 Right,Distal,Plantar Foot: Calcium Alginate with Silver Wound #7 Right,Proximal,Plantar Foot: Calcium Alginate with Silver Wound #8 Right,Anterior Lower Leg: Iodoflex Secondary Dressing: Dry Gauze ABD pad Drawtex Edema Control: 3 Layer Compression System - Right Lower Extremity Avoid standing for long periods of time Elevate legs to the level of the heart or above for 30 minutes daily and/or when sitting, a frequency of: - 3 to 4 times a day throughout the day. Off-Loading: Open toe surgical shoe to: - on right foot. The following medication(s) was prescribed: cefdinir oral 300 mg capsule 1 capsule oral bid for a further 10 days for diabetic foot infection starting 05/01/2019 1. Continue with silver alginate to the areas on the foot and Iodoflex to the area on her mid tibia which 2. I am going to continue her antibiotics for some reason we continue to have trouble getting surgical consultation 3 continue antibiotics until surgical consultationn cefdinir 300 bid for 1o days Electronic Signature(s) Signed: 05/01/2019 6:32:44 PM By: Linton Ham MD Entered By: Linton Ham on 05/01/2019 18:13:13 -------------------------------------------------------------------------------- SuperBill Details Patient Name: Date of Service: [name] 05/01/2019 Medical Record [id number] Patient Account Number: [id number] Date of Birth/Sex: Treating RN: March 29, 1996 (23 y.o. Orvan Falconer Primary Care Provider: Baltazar Apo Other Clinician: Referring Provider: Treating Provider/Extender:Mahamud Metts, Evlyn Kanner, [place] Dom in Treatment: 6 Diagnosis Coding ICD-10 Codes Code Description E10.621 Type 1 diabetes mellitus with foot ulcer M86.471 Chronic osteomyelitis with draining sinus, right ankle and foot E10.42 Type 1 diabetes mellitus with diabetic polyneuropathy L97.518 Non-pressure chronic ulcer of other part of right foot with other specified severity L97.512 Non-pressure chronic ulcer of other part of right foot with fat layer exposed L97.811 Non-pressure chronic ulcer of other part of right lower leg limited to breakdown of skin I89.0 Lymphedema, not elsewhere classified Facility Procedures CPT4 Code Description: [id number] (Facility Use Only) [phone] - APPLY MULTLAY COMPRS LWR RT LEG Modifier: Quantity: 1 Physician Procedures CPT4 Code Description: [id number] - WC PHYS LEVEL 3 - EST PT ICD-10 Diagnosis Description E10.621 Type 1 diabetes mellitus with foot ulcer L97.518 Non-pressure chronic ulcer of other part of right foot w L97.811 Non-pressure chronic ulcer of other  part of right lower skin Modifier: ith other specified leg limited to break Quantity: 1 severity down of Electronic Signature(s) Signed: 05/01/2019 6:32:44 PM By: Linton Ham MD Entered By: Linton Ham on 05/01/2019 18:13:35

## 2019-05-02 NOTE — Progress Notes (Addendum)
Lindsey Murillo, Lindsey Murillo ([id number]) Visit Report for 05/01/2019 Arrival Information Details Patient Name: Date of Service: Lindsey Murillo, Lindsey Murillo 05/01/2019 3:30 PM Medical Record [id number] Patient Account Number: [id number] Date of Birth/Sex: Treating RN: [birth year] Mur[birth year] (23 y.o. Lindsey Murillo, Linda Primary Care Orel Hord: Baltazar Apo Other Clinician: Referring Gerrett Loman: Treating Latrish Mogel/Extender:Robson, Evlyn Kanner, [place] Dom in Treatment: 6 Visit Information History Since Last Visit Added or deleted any medications: No Patient Arrived: Ambulatory Any new allergies or adverse reactions: No Arrival Time: 16:33 Had a fall or experienced change in No Accompanied By: self activities of daily living that may affect Transfer Assistance: None risk of falls: Patient Identification Verified: Yes Signs or symptoms of abuse/neglect since last No Secondary Verification Process Completed: Yes visito Patient Requires Transmission-Based No Hospitalized since last visit: No Precautions: Implantable device outside of the clinic excluding No Patient Has Alerts: No cellular tissue based products placed in the center since last visit: Has Dressing in Place as Prescribed: Yes Has Compression in Place as Prescribed: Yes Pain Present Now: No Electronic Signature(s) Signed: 05/01/2019 6:13:08 PM By: Baruch Gouty RN, BSN Entered By: Baruch Gouty on 05/01/2019 16:33:46 -------------------------------------------------------------------------------- Compression Therapy Details Patient Name: Lindsey Castle R. Date of Service: 05/01/2019 3:30 PM Medical Record [id number] Patient Account Number: [id number] Date of Birth/Sex: Treating RN: [birth year], Lindsey Murillo (23 y.o. Lindsey Murillo Primary Care Mileidy Atkin: Baltazar Apo Other Clinician: Referring Enolia Koepke: Treating Calogero Geisen/Extender:Robson, Evlyn Kanner, [place] Dom in Treatment: 6 Compression Therapy Performed for Wound  Wound #4 Right,Distal,Plantar Foot Assessment: Performed By: Jake Church, RN Compression Type: Three Layer Post Procedure Diagnosis Same as Pre-procedure Electronic Signature(s) Signed: 05/02/2019 11:01:46 AM By: Carlene Coria RN Entered By: Carlene Coria on 05/01/2019 17:53:49 -------------------------------------------------------------------------------- Compression Therapy Details Patient Name: Lindsey Castle R. Date of Service: 05/01/2019 3:30 PM Medical Record [id number] Patient Account Number: [id number] Date of Birth/Sex: Treating RN: [birth year] (23 y.o. Lindsey Murillo Primary Care Breland Trouten: Baltazar Apo Other Clinician: Referring Nysa Sarin: Treating Tanishi Nault/Extender:Robson, Evlyn Kanner, [place] Dom in Treatment: 6 Compression Therapy Performed for Wound Wound #7 Right,Proximal,Plantar Foot Assessment: Performed By: Jake Church, RN Compression Type: Three Layer Post Procedure Diagnosis Same as Pre-procedure Electronic Signature(s) Signed: 05/02/2019 11:01:46 AM By: Carlene Coria RN Entered By: Carlene Coria on 05/01/2019 17:53:49 -------------------------------------------------------------------------------- Compression Therapy Details Patient Name: Lindsey Castle R. Date of Service: 05/01/2019 3:30 PM Medical Record [id number] Patient Account Number: [id number] Date of Birth/Sex: Treating RN: Lindsey Mur[birth year] (23 y.o. Lindsey Murillo Primary Care Verdie Barrows: Baltazar Apo Other Clinician: Referring Ciena Sampley: Treating Ora Mcnatt/Extender:Robson, Evlyn Kanner, [place] Dom in Treatment: 6 Compression Therapy Performed for Wound Wound #8 Right,Anterior Lower Leg Assessment: Performed By: Jake Church, RN Compression Type: Three Layer Post Procedure Diagnosis Same as Pre-procedure Electronic Signature(s) Signed: 05/02/2019 11:01:46 AM By: Carlene Coria RN Entered By: Carlene Coria on 05/01/2019  17:53:49 -------------------------------------------------------------------------------- Encounter Discharge Information Details Patient Name: Date of Service: Lindsey Hew. 05/01/2019 3:30 PM Medical Record [id number] Patient Account Number: [id number] Date of Birth/Sex: Treating RN: Lindsey Mur[birth year] (23 y.o. Lindsey Murillo Primary Care Mohsin Crum: Baltazar Apo Other Clinician: Referring Jamilla Galli: Treating Jenet Durio/Extender:Robson, Evlyn Kanner, [place] Dom in Treatment: 6 Encounter Discharge Information Items Discharge Condition: Stable Ambulatory Status: Ambulatory Discharge Destination: Home Transportation: Private Auto Accompanied By: self Schedule Follow-up Appointment: Yes Clinical Summary of Care: Notes recheck BP 163/112. MD made aware. Explained to patient to follow up with PCP and to closely monitor BP at home. Electronic Signature(s) Signed: 05/01/2019 6:10:45 PM By: Deon Pilling Entered By: Deon Pilling on 05/01/2019 18:08:02 -------------------------------------------------------------------------------- Lower Extremity Assessment  Details Patient Name: Date of Service: Lindsey Murillo, Lindsey Murillo 05/01/2019 3:30 PM Medical Record [id number] Patient Account Number: [id number] Date of Birth/Sex: Treating RN: Lindsey Murillo, Lindsey Murillo (23 y.o. Lindsey Murillo Primary Care Jahmir Salo: Baltazar Apo Other Clinician: Referring Beldon Nowling: Treating Monserratt Knezevic/Extender:Robson, Evlyn Kanner, [place] Dom in Treatment: 6 Edema Assessment Assessed: [Left: No] [Right: No] Edema: [Left: Ye] [Right: s] Calf Left: Right: Point of Measurement: cm From Medial Instep cm 28.3 cm Ankle Left: Right: Point of Measurement: cm From Medial Instep cm 22.4 cm Vascular Assessment Pulses: Dorsalis Pedis Palpable: [Right:Yes] Electronic Signature(s) Signed: 05/01/2019 6:13:08 PM By: Baruch Gouty RN, BSN Entered By: Baruch Gouty on 05/01/2019  16:41:47 -------------------------------------------------------------------------------- Multi Wound Chart Details Patient Name: Date of Service: Lindsey Hew. 05/01/2019 3:30 PM Medical Record [id number] Patient Account Number: [id number] Date of Birth/Sex: Treating RN: [birth year], Lindsey Murillo (23 y.o. F) Primary Care Zonnie Landen: Baltazar Apo Other Clinician: Referring Bryttani Blew: Treating Tynlee Bayle/Extender:Robson, Evlyn Kanner, [place] Dom in Treatment: 6 Vital Signs Height(in): 68 Capillary Blood 87 Glucose(mg/dl): Weight(lbs): 137 Pulse(bpm): 126 Body Mass Index(BMI): 21 Blood Pressure(mmHg): 148/107 Temperature(F): 99.9 Respiratory 18 Rate(breaths/min): Photos: [4:No Photos] [7:No Photos] [8:No Photos] Wound Location: [4:Right Foot - Plantar, Distal Right Foot - Plantar,] [7:Proximal] [8:Right Lower Leg - Anterior] Wounding Event: [4:Gradually Appeared] [7:Gradually Appeared] [8:Blister] 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] [7:Lower Extremity] [8:Lower Extremity] Secondary Etiology: [4:N/A] [7:N/A] [8:Lymphedema] Comorbid History: [4:Anemia, Type I Diabetes Anemia, Type I Diabetes Anemia, Type I Diabetes] Date Acquired: [4:01/09/2019] [7:01/09/2019] [8:03/18/2019] Weeks of Treatment: [4:6] [7:6] [8:6] Wound Status: [4:Open] [7:Open] [8:Open] Measurements L x W x D 0.2x0.2x1.5 [7:0.2x0.3x0.4] [8:3.1x3.5x0.1] (cm) Area (cm) : [4:0.031] [7:0.047] [8:8.522] Volume (cm) : [4:0.047] [7:0.019] [8:0.852] % Reduction in Area: [4:99.40%] [7:89.30%] [8:-45.80%] % Reduction in Volume: 95.70% [7:93.80%] [8:-45.90%] Position 1 (o'clock): 9 [7:12] Maximum Distance 1 [4:1.7] [7:0.6] (cm): Position 2 (o'clock): [4:3] Maximum Distance 2 [4:4.2] (cm): Tunneling: [4:Yes] [7:Yes] [8:No] Classification: [4:Grade 3] [7:Grade 2] [8:Grade 1] Exudate Amount: [4:Medium] [7:Small] [8:Medium] Exudate  Type: [4:Serosanguineous] [7:Serous] [8:Serosanguineous] Exudate Color: [4:red, brown] [7:amber] [8:red, brown] Wound Margin: [4:Well defined, not attached] [7:Well defined, not attached] [8:Flat and Intact] Granulation Amount: [4:Small (1-33%)] [7:Large (67-100%)] [8:Large (67-100%)] Granulation Quality: [4:Pink] [7:Red] [8:Red] Necrotic Amount: [4:None Present (0%)] [7:None Present (0%)] [8:Small (1-33%)] Exposed Structures: [4:Fat Layer (Subcutaneous Tissue) Exposed: Yes Fascia: No Tendon: No Muscle: No Joint: No Bone: No] [7:Fat Layer (Subcutaneous Tissue) Exposed: Yes Fascia: No Tendon: No Muscle: No Joint: No Bone: No] [8:Fat Layer (Subcutaneous Tissue) Exposed: Yes  Fascia: No Tendon: No Muscle: No Joint: No Bone: No] Epithelialization: [4:Small (1-33%) Compression Therapy] [7:Small (1-33%) Compression Therapy] [8:Small (1-33%) Compression Therapy] Treatment Notes Electronic Signature(s) Signed: 05/01/2019 6:32:44 PM By: Linton Ham MD Entered By: Linton Ham on 05/01/2019 17:54:31 -------------------------------------------------------------------------------- Multi-Disciplinary Care Plan Details Patient Name: Date of Service: Lindsey Hew. 05/01/2019 3:30 PM Medical Record [id number] Patient Account Number: [id number] Date of Birth/Sex: Treating RN: [birth year] (23 y.o. Lindsey Murillo Primary Care Devany Aja: Baltazar Apo Other Clinician: Referring Damarie Schoolfield: Treating Keyandre Pileggi/Extender:Robson, Evlyn Kanner, [place] Dom in Treatment: 6 Active Inactive Wound/Skin Impairment Nursing Diagnoses: Knowledge deficit related to ulceration/compromised skin integrity Goals: Patient/caregiver will verbalize understanding of skin care regimen Date Initiated: 03/20/2019 Target Resolution Date: 05/25/2019 Goal Status: Active Ulcer/skin breakdown will have a volume reduction of 30% by week 4 Target Resolution Date Initiated: 03/20/2019 Date Inactivated:  04/24/2019 Date: 04/20/2019 Unmet Goal Status: Unmet Reason: COMORBITIES/ NON COMPLAINCE Ulcer/skin breakdown will have a volume reduction of 50%  by week 8 Date Initiated: 04/24/2019 Target Resolution Date: 05/25/2019 Goal Status: Active Interventions: Assess patient/caregiver ability to obtain necessary supplies Assess patient/caregiver ability to perform ulcer/skin care regimen upon admission and as needed Assess ulceration(s) every visit Notes: Electronic Signature(s) Signed: 05/02/2019 11:01:46 AM By: Carlene Coria RN Entered By: Carlene Coria on 05/01/2019 16:03:09 -------------------------------------------------------------------------------- Pain Assessment Details Patient Name: Date of Service: Lindsey Murillo, Lindsey Murillo 05/01/2019 3:30 PM Medical Record [id number] Patient Account Number: [id number] Date of Birth/Sex: Treating RN: Lindsey Mur[birth year] (23 y.o. Lindsey Murillo Primary Care Alissah Redmon: Baltazar Apo Other Clinician: Referring Allisen Pidgeon: Treating Rosealyn Little/Extender:Robson, Evlyn Kanner, [place] Dom in Treatment: 6 Active Problems Location of Pain Severity and Description of Pain Patient Has Paino No Site Locations Rate the pain. Current Pain Level: 0 Pain Management and Medication Current Pain Management: Electronic Signature(s) Signed: 05/01/2019 6:13:08 PM By: Baruch Gouty RN, BSN Entered By: Baruch Gouty on 05/01/2019 16:33:57 -------------------------------------------------------------------------------- Patient/Caregiver Education Details Ferrick, Sheritta 12/22/2020andnbsp3:30 Patient Name: Date of Service: [name] Medical Record Patient Account Number: [id number] Number: Treating RN: Carlene Coria Date of Birth/Gender: [birth year], Lindsey Murillo (23 y.o. F) Other Clinician: Primary Care Physician:LUKING, Loralyn Freshwater Linton Ham Referring Physician: Physician/Extender: Marylin Crosby in Treatment: 6 Education  Assessment Education Provided To: Patient Education Topics Provided Wound/Skin Impairment: Methods: Explain/Verbal Responses: State content correctly Electronic Signature(s) Signed: 05/02/2019 11:01:46 AM By: Carlene Coria RN Entered By: Carlene Coria on 05/01/2019 16:03:23 -------------------------------------------------------------------------------- Wound Assessment Details Patient Name: Date of Service: Lindsey Murillo, Lindsey Murillo 05/01/2019 3:30 PM Medical Record [id number] Patient Account Number: [id number] Date of Birth/Sex: Treating RN: Lindsey Mur[birth year] (23 y.o. Lindsey Murillo Primary Care Wylee Ogden: Baltazar Apo Other Clinician: Referring Aldeen Riga: Treating Reylynn Vanalstine/Extender:Robson, Evlyn Kanner, [place] Dom in Treatment: 6 Wound Status Wound Number: 4 Primary Diabetic Wound/Ulcer of the Lower Etiology: Extremity Wound Location: Right Foot - Plantar, Distal Wound Status: Open Wounding Event: Gradually Appeared Comorbid Anemia, Type I Diabetes Date Acquired: 01/09/2019 History: Weeks Of Treatment: 6 Clustered Wound: No Photos Wound Measurements Length: (cm) 0.2 % Reduction in Ar Width: (cm) 0.2 % Reduction in Vo Depth: (cm) 1.5 Epithelialization Area: (cm) 0.031 Tunneling: Volume: (cm) 0.047 Location 1 Position (o Maximum Dis Location 2 Position (o Maximum Dis ea: 99.4% lume: 95.7% : Small (1-33%) Yes 'clock): 9 tance: (cm) 1.7 'clock): 3 tance: (cm) 4.2 Undermining: No Wound Description Classification: Grade 3 Foul Odor After C Wound Margin: Well defined, not attached Slough/Fibrino Exudate Amount: Medium Exudate Type: Serosanguineous Exudate Color: red, brown Wound Bed Granulation Amount: Small (1-33%) Granulation Quality: Pink Fascia Exposed: Necrotic Amount: None Present (0%) Fat Layer (Subcut Tendon Exposed: Muscle Exposed: Joint Exposed: Bone Exposed: leansing: No Yes Exposed Structure No aneous Tissue) Exposed:  Yes No No No No Treatment Notes Wound #4 (Right, Distal, Plantar Foot) 1. Cleanse With Wound Cleanser Soap and water 2. Periwound Care Moisturizing lotion 3. Primary Dressing Applied Calcium Alginate Ag 4. Secondary Dressing ABD Pad Dry Gauze 6. Support Layer Applied 3 layer compression wrap Notes netting. Electronic Signature(s) Signed: 05/03/2019 11:25:30 AM By: Baruch Gouty RN, BSN Signed: 05/03/2019 11:48:58 AM By: Mikeal Hawthorne EMT/HBOT Previous Signature: 05/01/2019 6:13:08 PM Version By: Baruch Gouty RN, BSN Entered By: Mikeal Hawthorne on 05/03/2019 10:31:57 -------------------------------------------------------------------------------- Wound Assessment Details Patient Name: Date of Service: Lindsey Murillo, Lindsey Murillo 05/01/2019 3:30 PM Medical Record [id number] Patient Account Number: [id number] Date of Birth/Sex: Treating RN: [birth year], Lindsey Murillo (23 y.o. Lindsey Murillo Primary Care Hughey Rittenberry: Baltazar Apo Other Clinician: Referring Nahima Ales: Treating Nidal Rivet/Extender:Robson, Evlyn Kanner, [place] Dom in Treatment: 6 Wound Status Wound Number:  7 Primary Diabetic Wound/Ulcer of the Lower Etiology: Extremity Wound Location: Right Foot - Plantar, Proximal Wound Status: Open Wounding Event: Gradually Appeared Comorbid Anemia, Type I Diabetes Date Acquired: 01/09/2019 History: Weeks Of Treatment: 6 Clustered Wound: No Photos Wound Measurements Length: (cm) 0.2 Width: (cm) 0.3 Depth: (cm) 0.4 Area: (cm) 0.047 Volume: (cm) 0.019 % Reduction in Area: 89.3% % Reduction in Volume: 93.8% Epithelialization: Small (1-33%) Tunneling: Yes Position (o'clock): 12 Maximum Distance: (cm) 0.6 Undermining: No Wound Description Classification: Grade 2 Wound Margin: Well defined, not attached Exudate Amount: Small Exudate Type: Serous Exudate Color: amber Wound Bed Granulation Amount: Large (67-100%) Granulation Quality: Red Necrotic Amount: None  Present (0%) Foul Odor After Cleansing: No Slough/Fibrino Yes Exposed Structure Fascia Exposed: No Fat Layer (Subcutaneous Tissue) Exposed: Yes Tendon Exposed: No Muscle Exposed: No Joint Exposed: No Bone Exposed: No Treatment Notes Wound #7 (Right, Proximal, Plantar Foot) 1. Cleanse With Wound Cleanser Soap and water 2. Periwound Care Moisturizing lotion 3. Primary Dressing Applied Calcium Alginate Ag 4. Secondary Dressing ABD Pad Dry Gauze 6. Support Layer Applied 3 layer compression wrap Notes netting. Electronic Signature(s) Signed: 05/03/2019 11:25:30 AM By: Baruch Gouty RN, BSN Signed: 05/03/2019 11:48:58 AM By: Mikeal Hawthorne EMT/HBOT Previous Signature: 05/01/2019 6:13:08 PM Version By: Baruch Gouty RN, BSN Entered By: Mikeal Hawthorne on 05/03/2019 10:31:35 -------------------------------------------------------------------------------- Wound Assessment Details Patient Name: Date of Service: Lindsey Murillo, Lindsey Murillo 05/01/2019 3:30 PM Medical Record [id number] Patient Account Number: [id number] Date of Birth/Sex: Treating RN: [birth year], Lindsey Murillo (23 y.o. Lindsey Murillo Primary Care Mattia Liford: Baltazar Apo Other Clinician: Referring Wei Poplaski: Treating Kameelah Minish/Extender:Robson, Evlyn Kanner, [place] Dom in Treatment: 6 Wound Status Wound Number: 8 Primary Etiology: Diabetic Wound/Ulcer of the Lower Extremity Wound Location: Right Lower Leg - Anterior Secondary Lymphedema Wounding Event: Blister Etiology: Date Acquired: 03/18/2019 Wound Status: Open Weeks Of Treatment: 6 Comorbid Anemia, Type I Diabetes Clustered Wound: No History: Photos Wound Measurements Length: (cm) 3.1 % Reductio Width: (cm) 3.5 % Reductio Depth: (cm) 0.1 Epithelial Area: (cm) 8.522 Tunneling Volume: (cm) 0.852 Undermini Wound Description Classification: Grade 1 Wound Margin: Flat and Intact Exudate Amount: Medium Exudate Type: Serosanguineous Exudate Color:  red, brown Wound Bed Granulation Amount: Large (67-100%) Granulation Quality: Red Necrotic Amount: Small (1-33%) Necrotic Quality: Adherent Slough Foul Odor After Cleansing: No Slough/Fibrino No Exposed Structure Fascia Exposed: No Fat Layer (Subcutaneous Tissue) Exposed: Yes Tendon Exposed: No Muscle Exposed: No Joint Exposed: No Bone Exposed: No n in Area: -45.8% n in Volume: -45.9% ization: Small (1-33%) : No ng: No Treatment Notes Wound #8 (Right, Anterior Lower Leg) 1. Cleanse With Wound Cleanser Soap and water 2. Periwound Care Moisturizing lotion 3. Primary Dressing Applied Iodoflex 4. Secondary Dressing ABD Pad Dry Gauze 6. Support Layer Applied 3 layer compression wrap Notes netting. Electronic Signature(s) Signed: 05/03/2019 11:25:30 AM By: Baruch Gouty RN, BSN Signed: 05/03/2019 11:48:58 AM By: Mikeal Hawthorne EMT/HBOT Previous Signature: 05/01/2019 6:13:08 PM Version By: Baruch Gouty RN, BSN Entered By: Mikeal Hawthorne on 05/03/2019 10:21:51 -------------------------------------------------------------------------------- Vitals Details Patient Name: Date of Service: Lindsey Hew. 05/01/2019 3:30 PM Medical Record [id number] Patient Account Number: [id number] Date of Birth/Sex: Treating RN: [birth year] (23 y.o. Lindsey Murillo Primary Care Govani Radloff: Baltazar Apo Other Clinician: Referring Olyn Landstrom: Treating Trinnity Breunig/Extender:Robson, Evlyn Kanner, [place] Dom in Treatment: 6 Vital Signs Time Taken: 16:30 Temperature (F): 99.9 Height (in): 68 Pulse (bpm): 126 Source: Stated Respiratory Rate (breaths/min): 18 Weight (lbs): 137 Blood Pressure (mmHg): 148/107 Source: Stated Capillary Blood Glucose (mg/dl): 87 Body Mass Index (BMI):  20.8 Reference Range: 80 - 120 mg / dl Notes glucose per pt report this am Electronic Signature(s) Signed: 05/01/2019 6:13:08 PM By: Baruch Gouty RN, BSN Entered By: Baruch Gouty  on 05/01/2019 16:33:24

## 2019-05-07 ENCOUNTER — Ambulatory Visit: Payer: PRIVATE HEALTH INSURANCE | Admitting: Nutrition

## 2019-05-07 ENCOUNTER — Telehealth: Payer: Self-pay | Admitting: Nutrition

## 2019-05-07 NOTE — Telephone Encounter (Signed)
VM left to call back and reschedule missed appt.

## 2019-05-08 ENCOUNTER — Other Ambulatory Visit: Payer: Self-pay

## 2019-05-08 ENCOUNTER — Encounter (HOSPITAL_BASED_OUTPATIENT_CLINIC_OR_DEPARTMENT_OTHER): Payer: PRIVATE HEALTH INSURANCE | Admitting: Internal Medicine

## 2019-05-08 DIAGNOSIS — E10621 Type 1 diabetes mellitus with foot ulcer: Secondary | ICD-10-CM | POA: Diagnosis not present

## 2019-05-08 NOTE — Progress Notes (Signed)
Lindsey Murillo, CURVIN ([id number]) Visit Report for 05/08/2019 HPI Details Patient Name: Date of Service: Lindsey Murillo, ROCKS 05/08/2019 3:30 PM Medical Record [id number] Patient Account Number: [id number] Date of Birth/Sex: Treating RN: June 28, 1995 (23 y.o. F) Primary Care Provider: Baltazar Apo Other Clinician: Referring Provider: Treating Provider/Extender:Bryan Goin, Evlyn Kanner, [place] Dom in Treatment: 7 History of Present Illness HPI Description: 06/16/2018 ADMISSION This is a 23 year old type I diabetic patient. She was referred by her family physician Dr. Wolfgang Phoenix in [place] where they live. She had been seen in the urgent care at [facility] on 05/27/2018 with a large bulla on the right dorsal foot. An x-ray of the foot showed swelling of the base of her first toe and first MTP with no soft tissue gas or bony erosion. She came to the primary physician's attention in follow-up on 06/09/2018 noted to have a diabetic foot ulcer. She was advised to take work off. She has been using peroxide and Neosporin to the wound. Past medical history; the patient does not have a prior history of diabetic foot ulcers. She is listed as having both peripheral neuropathy and autonomic neuropathy. Chronic diarrhea thoracic back pain and anemia. ABIs in our clinic were noncompressible on the right 2/11; follow-up for this badly infected wound that we admitted to the clinic on 2/7. The area in question is on the right first metatarsal head. When she arrived in clinic last week there was swelling and erythema in the foot as well as on the lateral part of the first metatarsal head. Plain x-rays showed soft tissue wound with soft tissue gas noted overlying the first MTP no radiographic changes of osteomyelitis. The culture showed abundant group B strep. The Augmentin that I chose empirically last time clearly the right choice. She is offloading this in a healing sandal. She is not working. She  states her diabetes is under reasonably good control 2/14; continued follow-up. The area in question which is on the right first metatarsal head looks improved although still requiring debridement were using silver alginate. She is completing 10 days of Augmentin which she has tolerated well. According to the patient she is offloading this quite aggressively. She is still in a surgical shoe 2/21; continued follow-up. Right first metatarsal head generally improved but still requiring debridement there is less swelling and no erythema. She is offloading this in a surgical shoe. 2/28; right first metatarsal head generally improved but still requiring debridement we have been using Hydrofera Blue she is using a surgical shoe but I changed her to a Darco forefoot off loader today. Possible she will ultimately require a total contact cast 3/6 right first metatarsal head generally improved there is less adherent debris and generally washes off nicely with Anasept and gauze. Using Hydrofera Blue and a Darco. We are going to consider putting her in a total contact cast today however we did not have sufficient supplies 3/13; our intake nurse reported serosanguineous drainage which was concerning. The patient denies any systemic symptoms we have been using Hydrofera Blue in today with anticipation of putting on a total contact cast. However we also noted swelling around the wound. 3/20 -Right first metatarsal head wound appears granulating, without areas of necrosis or debris surrounding skin appears good, she is completed or almost completed her Keflex course. We will continue with the alginate and she is doing the offloading with the surgical shoe and felt. 3/27; right first metatarsal head. There is no evidence of infection currently. She has senescent looking wound edges and  some debris on the wound surface but in general things look quite a bit better today. We have been using alginate and offloading  her with a forefoot off loader. I am not going to consider total contact casting her in this environment until I can be certain with that the clinic will be open to deal with the obligatory first cast change in the routine changing 4/3; right first metatarsal head. No evidence of infection. The tissue looks healthy although there is some tunneling laterally towards the mid part of the foot. We are using silver alginate, I have changed that the silver collagen today. 4/9 right first metatarsal head. No evidence of infection. Using silver collagen. 4/17 right first metatarsal head the wound is actually larger today and I think deeper. There is no evidence of recurrent infection. 4/20; back for her obligatory first total contact cast change. The wound on her foot actually looks quite good on the right first metatarsal head however she clearly has a cast injury at the superior margin of the cast just at the folded level this was in spite of padding this area. 4/24; wound on the right first metatarsal head looks better. The cast injury wounds on the anterior tibial area also look better. 5/1 wound on the right first metatarsal head is closed over. The cost injury wounds on the anterior tibial are also closed. The patient has type 1 diabetes with polyneuropathy. I do not think she has the financial means for diabetic shoes with custom inserts READMISSION 03/20/2019 This is a patient with type 1 diabetes and polyneuropathy. She was seen earlier this year with a wound on the right first metatarsal head the closed over. We recommended that she offload this in her shoes. I do not believe that was done but in any case her wounds reopened sometime in October. She was seen in the ER on 10/20 with a diabetic foot ulcer. Right foot was swollen and erythematous. She was put on antibiotics although at the time of this dictation I am not exactly sure which one. An x-ray was done that showed no osteo-. She has  been soaking her foot in Epson salts and applying dry gauze. She is still working at [employer]. She has 4 open areas; one at the base of the second through fourth toes. One at the first met head 1 over the second met head and 1 over the midfoot. The second met head wound probes proximally towards her midfoot and midfoot wound probes superiorly although I could not connect these 2 wounds. There is no purulent drainage. A considerable amount of denuded skin over this area which I think is a remanent of cellulitis although the medial part of her foot is still erythematous there is no tenderness. For the most part I think this looks like treated cellulitis from her antibiotics in October. She has a superficial area of epithelial loss on the right lower mid tibia Past medical history; patient is a type I diabetic with polyneuropathy. She is not felt to have PAD.. She is unaware of her hemoglobin A1c she is supposed to see an endocrinologist this afternoon. 11/19; MRI is tomorrow. Culture I did last time showed group G strep. This would have been covered by the empiric Augmentin I gave her when I first saw her. She should have 3 more days of this I am going to extend this by another 4 days until we have time to digest the MRI. If she has osteomyelitis she is going to   need to see infectious disease for IV antibiotics. She has not been systemically unwell. She is tolerating the Augmentin well 12/1; the patient comes in today stating that her MRI that was supposed to be done on 11/20 was canceled by the hospital. She does not have an appointment. She has completed the Augmentin I gave her for the culture showing group G strep. Paradoxically she has less of an open area on the proximal foot just above her plantar metatarsals the midfoot also seems to be probing less and the left met head is closed. She also has a fairly extensive area on the right mid tibia in the setting of what looks to be lymphedema.  According to our records this was present on admission but I do not think I looked at this specifically the last time she was here. 12/8; I finally had to send this patient to the ER in order to get an MRI performed. The MRI showed findings consistent with osteomyelitis in the head and neck of the fifth metatarsal and the base and proximal phalanx of the fifth toe. There is also an abscess in the subcutaneous tissues dorsal to the fifth MTP and proximal phalanx of the little toe. Also noted that there is an abscess in the plantar soft tissues of the foot just proximal to the toes which extends from the great toe to the fourth toe. They did do an MRI of the tib-fib which was negative for underlying bone issues. Culture I did last week showed abundant Serratia and moderate group B strep They did not admit her last week from the ER which to me was surprising. She has purulent drainage coming out of the small wound at roughly the third met head. Erythema and swelling of the forefoot. She continues to have a large wound on the anterior mid tibia on the right 12/15; the patient still has the 2 small open areas over the second met head. Still draining purulent drainage although the entire foot looks less erythematous and less swollen. She still has the small area in the midfoot She has lymphedema in the upper leg the area on the mid tibia still requiring debridement but looks better. 12/22; single wound in the area over the second met head. Still with a purulent drainage although less so. The entire foot looks less erythematous and less swollen. But both are still present. The area on the midfoot is smaller. We have been using silver alginate to both of these areas -The area on the anterior tibial area looks healthier. We are using Iodoflex here under compression She still does not have an appointment with Dr. Sharol Given. This is in spite of the fact that it was suggested she get one when she left the ER and  that we have been working on this for 2 weeks. Will call his office tomorrow 12/29; the patient is seen Dr. Sharol Given tomorrow about the underlying osteomyelitis involving the fifth metatarsal and proximal phalanx of the fifth toe as well as the abscess in the plantar foot. On MRI. I have had her on a fairly prolonged course of cefdinir 300 twice daily. The abscess in the right plantar forefoot distal to the metatarsal heads. She still has 2 probing wounds in this area. At one point she had an area over the fifth metatarsal head that connected with an area on the midfoot. Both of these areas are closed. There is a lot less swelling and erythema in the forefoot. The area on the right anterior  mid tibia looks a lot better. We have been using Iodoflex on this area. Electronic Signature(s) Signed: 05/08/2019 6:15:30 PM By: Linton Ham MD Entered By: Linton Ham on 05/08/2019 17:56:07 -------------------------------------------------------------------------------- Physical Exam Details Patient Name: Date of Service: KERSTON, LANDECK 05/08/2019 3:30 PM Medical Record [id number] Patient Account Number: [id number] Date of Birth/Sex: Treating RN: 07-28-1995 (23 y.o. F) Primary Care Provider: Baltazar Apo Other Clinician: Referring Provider: Treating Provider/Extender:Stephnie Parlier, Evlyn Kanner, [place] Dom in Treatment: 7 Constitutional Patient is hypertensive.. Pulse regular and within target range for patient.Marland Kitchen Respirations regular, non-labored and within target range.. Temperature is normal and within the target range for the patient.Marland Kitchen Appears in no distress. Eyes Conjunctivae clear. No discharge.no icterus. Respiratory work of breathing is normal. Cardiovascular The pulses are palpable on the right. Lymphatic No lymphadenopathy on the right. Psychiatric More talkative today. Not as flat as she usually is. Notes Wound exam Continued improvement in the forefoot swelling  and erythema. She still has the probing wounds just above her third MTP. The open wound in the midfoot is closed area over the fifth metatarsal head is closed. Electronic Signature(s) Signed: 05/08/2019 6:15:30 PM By: Linton Ham MD Entered By: Linton Ham on 05/08/2019 17:57:36 -------------------------------------------------------------------------------- Physician Orders Details Patient Name: Date of Service: [name] 05/08/2019 3:30 PM Medical Record [id number] Patient Account Number: [id number] Date of Birth/Sex: Treating RN: 1995/10/08 (23 y.o. Orvan Falconer Primary Care Provider: Baltazar Apo Other Clinician: Referring Provider: Treating Provider/Extender:Ignatz Deis, Evlyn Kanner, [place] Dom in Treatment: 7 Verbal / Phone Orders: No Diagnosis Coding ICD-10 Coding Code Description E10.621 Type 1 diabetes mellitus with foot ulcer M86.471 Chronic osteomyelitis with draining sinus, right ankle and foot E10.42 Type 1 diabetes mellitus with diabetic polyneuropathy L97.518 Non-pressure chronic ulcer of other part of right foot with other specified severity L97.512 Non-pressure chronic ulcer of other part of right foot with fat layer exposed L97.811 Non-pressure chronic ulcer of other part of right lower leg limited to breakdown of skin I89.0 Lymphedema, not elsewhere classified Follow-up Appointments Return Appointment in 1 week. Dressing Change Frequency Do not change entire dressing for one week. Wound Cleansing Clean wound with Normal Saline. May shower and wash wound with soap and water. Primary Wound Dressing Wound #4 Right,Distal,Plantar Foot Calcium Alginate with Silver Wound #7 Right,Proximal,Plantar Foot Calcium Alginate with Silver Wound #8 Right,Anterior Lower Leg Iodoflex Secondary Dressing Dry Gauze ABD pad Drawtex Edema Control 3 Layer Compression System - Right Lower Extremity Avoid standing for long periods of  time Elevate legs to the level of the heart or above for 30 minutes daily and/or when sitting, a frequency of: - 3 to 4 times a day throughout the day. Off-Loading Open toe surgical shoe to: - on right foot. Electronic Signature(s) Signed: 05/08/2019 5:43:27 PM By: Carlene Coria RN Signed: 05/08/2019 6:15:30 PM By: Linton Ham MD Entered By: Carlene Coria on 05/08/2019 57:84:69 -------------------------------------------------------------------------------- Problem List Details Patient Name: Date of Service: ZEAH, GERMANO 05/08/2019 3:30 PM Medical Record [id number] Patient Account Number: [id number] Date of Birth/Sex: Treating RN: March 01, 1996 (23 y.o. Orvan Falconer Primary Care Provider: Baltazar Apo Other Clinician: Referring Provider: Treating Provider/Extender:Ariadne Rissmiller, Evlyn Kanner, [place] Dom in Treatment: 7 Active Problems ICD-10 Evaluated Encounter Code Description Active Date Today Diagnosis E10.621 Type 1 diabetes mellitus with foot ulcer 03/20/2019 No Yes M86.471 Chronic osteomyelitis with draining sinus, right ankle 04/17/2019 No Yes and foot E10.42 Type 1 diabetes mellitus with diabetic polyneuropathy 03/20/2019 No Yes L97.518 Non-pressure chronic ulcer of other part of right foot  03/20/2019 No Yes with other specified severity L97.512 Non-pressure chronic ulcer of other part of right foot 03/20/2019 No Yes with fat layer exposed L97.811 Non-pressure chronic ulcer of other part of right lower 03/20/2019 No Yes leg limited to breakdown of skin I89.0 Lymphedema, not elsewhere classified 03/20/2019 No Yes Inactive Problems ICD-10 Code Description Active Date Inactive Date L03.115 Cellulitis of right lower limb 03/20/2019 03/20/2019 Resolved Problems Electronic Signature(s) Signed: 05/08/2019 6:15:30 PM By: Linton Ham MD Previous Signature: 05/08/2019 5:43:27 PM Version By: Carlene Coria RN Entered By: Linton Ham on 05/08/2019  17:46:16 -------------------------------------------------------------------------------- Progress Note Details Patient Name: Date of Service: [name] 05/08/2019 3:30 PM Medical Record [id number] Patient Account Number: [id number] Date of Birth/Sex: Treating RN: [birth year] (23 y.o. F) Primary Care Provider: Baltazar Apo Other Clinician: Referring Provider: Treating Provider/Extender:Pegeen Stiger, Evlyn Kanner, [place] Dom in Treatment: 7 Subjective History of Present Illness (HPI) 06/16/2018 ADMISSION This is a 23 year old type I diabetic patient. She was referred by her family physician Dr. Wolfgang Phoenix in [place] where they live. She had been seen in the urgent care at [facility] on 05/27/2018 with a large bulla on the right dorsal foot. An x-ray of the foot showed swelling of the base of her first toe and first MTP with no soft tissue gas or bony erosion. She came to the primary physician's attention in follow-up on 06/09/2018 noted to have a diabetic foot ulcer. She was advised to take work off. She has been using peroxide and Neosporin to the wound. Past medical history; the patient does not have a prior history of diabetic foot ulcers. She is listed as having both peripheral neuropathy and autonomic neuropathy. Chronic diarrhea thoracic back pain and anemia. ABIs in our clinic were noncompressible on the right 2/11; follow-up for this badly infected wound that we admitted to the clinic on 2/7. The area in question is on the right first metatarsal head. When she arrived in clinic last week there was swelling and erythema in the foot as well as on the lateral part of the first metatarsal head. Plain x-rays showed soft tissue wound with soft tissue gas noted overlying the first MTP no radiographic changes of osteomyelitis. The culture showed abundant group B strep. The Augmentin that I chose empirically last time clearly the right choice. She is offloading this in a  healing sandal. She is not working. She states her diabetes is under reasonably good control 2/14; continued follow-up. The area in question which is on the right first metatarsal head looks improved although still requiring debridement were using silver alginate. She is completing 10 days of Augmentin which she has tolerated well. According to the patient she is offloading this quite aggressively. She is still in a surgical shoe 2/21; continued follow-up. Right first metatarsal head generally improved but still requiring debridement there is less swelling and no erythema. She is offloading this in a surgical shoe. 2/28; right first metatarsal head generally improved but still requiring debridement we have been using Hydrofera Blue she is using a surgical shoe but I changed her to a Darco forefoot off loader today. Possible she will ultimately require a total contact cast 3/6 right first metatarsal head generally improved there is less adherent debris and generally washes off nicely with Anasept and gauze. Using Hydrofera Blue and a Darco. We are going to consider putting her in a total contact cast today however we did not have sufficient supplies 3/13; our intake nurse reported serosanguineous drainage which was concerning. The patient denies  any systemic symptoms we have been using Hydrofera Blue in today with anticipation of putting on a total contact cast. However we also noted swelling around the wound. 3/20 -Right first metatarsal head wound appears granulating, without areas of necrosis or debris surrounding skin appears good, she is completed or almost completed her Keflex course. We will continue with the alginate and she is doing the offloading with the surgical shoe and felt. 3/27; right first metatarsal head. There is no evidence of infection currently. She has senescent looking wound edges and some debris on the wound surface but in general things look quite a bit better today. We  have been using alginate and offloading her with a forefoot off loader. I am not going to consider total contact casting her in this environment until I can be certain with that the clinic will be open to deal with the obligatory first cast change in the routine changing 4/3; right first metatarsal head. No evidence of infection. The tissue looks healthy although there is some tunneling laterally towards the mid part of the foot. We are using silver alginate, I have changed that the silver collagen today. 4/9 right first metatarsal head. No evidence of infection. Using silver collagen. 4/17 right first metatarsal head the wound is actually larger today and I think deeper. There is no evidence of recurrent infection. 4/20; back for her obligatory first total contact cast change. The wound on her foot actually looks quite good on the right first metatarsal head however she clearly has a cast injury at the superior margin of the cast just at the folded level this was in spite of padding this area. 4/24; wound on the right first metatarsal head looks better. The cast injury wounds on the anterior tibial area also look better. 5/1 wound on the right first metatarsal head is closed over. The cost injury wounds on the anterior tibial are also closed. The patient has type 1 diabetes with polyneuropathy. I do not think she has the financial means for diabetic shoes with custom inserts READMISSION 03/20/2019 This is a patient with type 1 diabetes and polyneuropathy. She was seen earlier this year with a wound on the right first metatarsal head the closed over. We recommended that she offload this in her shoes. I do not believe that was done but in any case her wounds reopened sometime in October. She was seen in the ER on 10/20 with a diabetic foot ulcer. Right foot was swollen and erythematous. She was put on antibiotics although at the time of this dictation I am not exactly sure which one. An x-ray  was done that showed no osteo-. She has been soaking her foot in Epson salts and applying dry gauze. She is still working at [employer]. She has 4 open areas; one at the base of the second through fourth toes. One at the first met head 1 over the second met head and 1 over the midfoot. The second met head wound probes proximally towards her midfoot and midfoot wound probes superiorly although I could not connect these 2 wounds. There is no purulent drainage. A considerable amount of denuded skin over this area which I think is a remanent of cellulitis although the medial part of her foot is still erythematous there is no tenderness. For the most part I think this looks like treated cellulitis from her antibiotics in October. She has a superficial area of epithelial loss on the right lower mid tibia Past medical history; patient is a  type I diabetic with polyneuropathy. She is not felt to have PAD.Marland Kitchen She is unaware of her hemoglobin A1c she is supposed to see an endocrinologist this afternoon. 11/19; MRI is tomorrow. Culture I did last time showed group G strep. This would have been covered by the empiric Augmentin I gave her when I first saw her. She should have 3 more days of this I am going to extend this by another 4 days until we have time to digest the MRI. If she has osteomyelitis she is going to need to see infectious disease for IV antibiotics. She has not been systemically unwell. She is tolerating the Augmentin well 12/1; the patient comes in today stating that her MRI that was supposed to be done on 11/20 was canceled by the hospital. She does not have an appointment. She has completed the Augmentin I gave her for the culture showing group G strep. Paradoxically she has less of an open area on the proximal foot just above her plantar metatarsals the midfoot also seems to be probing less and the left met head is closed. She also has a fairly extensive area on the right mid tibia in the  setting of what looks to be lymphedema. According to our records this was present on admission but I do not think I looked at this specifically the last time she was here. 12/8; I finally had to send this patient to the ER in order to get an MRI performed. The MRI showed findings consistent with osteomyelitis in the head and neck of the fifth metatarsal and the base and proximal phalanx of the fifth toe. There is also an abscess in the subcutaneous tissues dorsal to the fifth MTP and proximal phalanx of the little toe. Also noted that there is an abscess in the plantar soft tissues of the foot just proximal to the toes which extends from the great toe to the fourth toe. They did do an MRI of the tib-fib which was negative for underlying bone issues. Culture I did last week showed abundant Serratia and moderate group B strep They did not admit her last week from the ER which to me was surprising. She has purulent drainage coming out of the small wound at roughly the third met head. Erythema and swelling of the forefoot. She continues to have a large wound on the anterior mid tibia on the right 12/15; the patient still has the 2 small open areas over the second met head. Still draining purulent drainage although the entire foot looks less erythematous and less swollen. She still has the small area in the midfoot She has lymphedema in the upper leg the area on the mid tibia still requiring debridement but looks better. 12/22; single wound in the area over the second met head. Still with a purulent drainage although less so. The entire foot looks less erythematous and less swollen. But both are still present. The area on the midfoot is smaller. We have been using silver alginate to both of these areas -The area on the anterior tibial area looks healthier. We are using Iodoflex here under compression She still does not have an appointment with Dr. Sharol Given. This is in spite of the fact that it was  suggested she get one when she left the ER and that we have been working on this for 2 weeks. Will call his office tomorrow 12/29; the patient is seen Dr. Sharol Given tomorrow about the underlying osteomyelitis involving the fifth metatarsal and proximal phalanx of  the fifth toe as well as the abscess in the plantar foot. On MRI. I have had her on a fairly prolonged course of cefdinir 300 twice daily. The abscess in the right plantar forefoot distal to the metatarsal heads. She still has 2 probing wounds in this area. At one point she had an area over the fifth metatarsal head that connected with an area on the midfoot. Both of these areas are closed. There is a lot less swelling and erythema in the forefoot. The area on the right anterior mid tibia looks a lot better. We have been using Iodoflex on this area. Objective Constitutional Patient is hypertensive.. Pulse regular and within target range for patient.Marland Kitchen Respirations regular, non-labored and within target range.. Temperature is normal and within the target range for the patient.Marland Kitchen Appears in no distress. Vitals Time Taken: 4:23 PM, Height: 68 in, Weight: 137 lbs, BMI: 20.8, Temperature: 98.1 F, Pulse: 121 bpm, Respiratory Rate: 18 breaths/min, Blood Pressure: 132/103 mmHg, Capillary Blood Glucose: 75 mg/dl. General Notes: patient stated that CBG was 75. No s/s of headache or dizziness and stated her BP and HR was always like that. MD notified Eyes Conjunctivae clear. No discharge.no icterus. Respiratory work of breathing is normal. Cardiovascular The pulses are palpable on the right. Lymphatic No lymphadenopathy on the right. Psychiatric More talkative today. Not as flat as she usually is. General Notes: Wound exam ooContinued improvement in the forefoot swelling and erythema. She still has the probing wounds just above her third MTP. ooThe open wound in the midfoot is closed area over the fifth metatarsal head is  closed. Integumentary (Hair, Skin) Wound #4 status is Open. Original cause of wound was Gradually Appeared. The wound is located on the [place]. The wound measures 0.2cm length x 0.2cm width x 1.4cm depth; 0.031cm^2 area and 0.044cm^3 volume. There is Fat Layer (Subcutaneous Tissue) Exposed exposed. There is no undermining noted, however, there is tunneling at 9:00 with a maximum distance of 1.5cm. There is additional tunneling and at 3:00 with a maximum distance of 2.2cm. There is a medium amount of serosanguineous drainage noted. The wound margin is well defined and not attached to the wound base. There is small (1-33%) pink granulation within the wound bed. There is no necrotic tissue within the wound bed. Wound #7 status is Open. Original cause of wound was Gradually Appeared. The wound is located on the Right,Proximal,Plantar Foot. The wound measures 0.4cm length x 0.3cm width x 0.3cm depth; 0.094cm^2 area and 0.028cm^3 volume. There is Fat Layer (Subcutaneous Tissue) Exposed exposed. There is no tunneling or undermining noted. There is a small amount of serosanguineous drainage noted. The wound margin is well defined and not attached to the wound base. There is large (67-100%) red granulation within the wound bed. There is no necrotic tissue within the wound bed. Wound #8 status is Open. Original cause of wound was Blister. The wound is located on the Right,Anterior Lower Leg. The wound measures 2.5cm length x 2.7cm width x 0.1cm depth; 5.301cm^2 area and 0.53cm^3 volume. There is Fat Layer (Subcutaneous Tissue) Exposed exposed. There is no tunneling or undermining noted. There is a medium amount of serosanguineous drainage noted. The wound margin is flat and intact. There is large (67-100%) red granulation within the wound bed. There is a small (1-33%) amount of necrotic tissue within the wound bed including Adherent Slough. 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 There was a Three Layer Compression Therapy Procedure by Carlene Coria, RN. Post procedure Diagnosis Wound #4: Same as Pre-Procedure Wound #7 Pre-procedure diagnosis of Wound #7 is a Diabetic Wound/Ulcer of the Lower Extremity located on the Right,Proximal,Plantar Foot . There was a Three Layer Compression Therapy Procedure by Carlene Coria, RN. Post procedure Diagnosis Wound #7: Same as Pre-Procedure Wound #8 Pre-procedure diagnosis of Wound #8 is a Diabetic Wound/Ulcer of the Lower Extremity located on the Right,Anterior Lower Leg . There was a Three Layer Compression Therapy Procedure by Carlene Coria, RN. Post procedure Diagnosis Wound #8: Same as Pre-Procedure Plan Follow-up Appointments: Return Appointment in 1 week. Dressing Change Frequency: Do not change entire dressing for one week. Wound Cleansing: Clean wound with Normal Saline. May shower and wash wound with soap and water. Primary Wound Dressing: Wound #4 Right,Distal,Plantar Foot: Calcium Alginate with Silver Wound #7 Right,Proximal,Plantar Foot: Calcium Alginate with Silver Wound #8 Right,Anterior Lower Leg: Iodoflex Secondary Dressing: Dry Gauze ABD pad Drawtex Edema Control: 3 Layer Compression System - Right Lower Extremity Avoid standing for long periods of time Elevate legs to the level of the heart or above for 30 minutes daily  and/or when sitting, a frequency of: - 3 to 4 times a day throughout the day. Off-Loading: Open toe surgical shoe to: - on right foot. 1. Osteomyelitis of the fifth metatarsal and proximal fifth phalanx 2. Extensive abscess in the right foot extending across the metatarsal heads and I think at 1 point extending from the fifth metatarsal phalangeal joint into the midfoot. Miraculously she looks a lot better. There is still a probing wound above the third MTP joint. 3. I have had her on a prolonged course of cefdinir. Culture I did of purulent drainage from this probing area showed Serratia and group B strep. 4. She sees orthopedic surgery tomorrow question of ray amputation on the fifth. 5. This is been a viable from start to finish. It took [place] a prolonged period of time to get the MRI done I had to send her to the ER. I thought at that point they would admit her but they did not. 6. Continued improvement in the mid tibial ulcer which I think was lymphedema related. We have her under compression Electronic Signature(s) Signed: 05/08/2019 6:15:30 PM By: Linton Ham MD Entered By: Linton Ham on 05/08/2019 18:00:10 -------------------------------------------------------------------------------- SuperBill Details Patient Name: Date of Service: [name] 05/08/2019 Medical Record [id number] Patient Account Number: [id number] Date of Birth/Sex: Treating RN: Aug 09, 1995 (23 y.o. Orvan Falconer Primary Care Provider: Baltazar Apo Other Clinician: Referring Provider: Treating Provider/Extender:Eugenia Eldredge, Evlyn Kanner, [place] Dom in Treatment: 7 Diagnosis Coding ICD-10 Codes Code Description E10.621 Type 1 diabetes mellitus with foot ulcer M86.471 Chronic osteomyelitis with draining sinus, right ankle and foot E10.42 Type 1 diabetes mellitus with diabetic polyneuropathy L97.518 Non-pressure chronic ulcer of other part of right foot with other specified  severity L97.512 Non-pressure chronic ulcer of other part of right foot with fat layer exposed L97.811 Non-pressure chronic ulcer of other part of right lower leg limited to breakdown  of skin I89.0 Lymphedema, not elsewhere classified Facility Procedures CPT4 Code Description: [id number] (Facility Use Only) [phone] - APPLY MULTLAY COMPRS LWR RT LEG Modifier: Quantity: 1 Physician Procedures CPT4 Code Description: 2162446 [id number] - WC PHYS LEVEL 4 - EST PT ICD-10 Diagnosis Description M86.471 Chronic osteomyelitis with draining sinus, right ankle a E10.621 Type 1 diabetes mellitus with foot ulcer L97.518 Non-pressure chronic ulcer of other  part of right foot w L97.811 Non-pressure chronic ulcer of other part of right lower skin Modifier: nd foot ith other specified leg limited to break Quantity: 1 severity down of Electronic Signature(s) Signed: 05/08/2019 6:15:30 PM By: Linton Ham MD Previous Signature: 05/08/2019 5:43:27 PM Version By: Carlene Coria RN Entered By: Linton Ham on 05/08/2019 18:00:36

## 2019-05-09 ENCOUNTER — Encounter (HOSPITAL_BASED_OUTPATIENT_CLINIC_OR_DEPARTMENT_OTHER): Payer: PRIVATE HEALTH INSURANCE | Admitting: Internal Medicine

## 2019-05-09 ENCOUNTER — Encounter: Payer: Self-pay | Admitting: Physician Assistant

## 2019-05-09 ENCOUNTER — Ambulatory Visit (INDEPENDENT_AMBULATORY_CARE_PROVIDER_SITE_OTHER): Payer: PRIVATE HEALTH INSURANCE | Admitting: Physician Assistant

## 2019-05-09 VITALS — Ht 69.0 in | Wt 130.0 lb

## 2019-05-09 DIAGNOSIS — E10621 Type 1 diabetes mellitus with foot ulcer: Secondary | ICD-10-CM | POA: Diagnosis not present

## 2019-05-09 DIAGNOSIS — M86271 Subacute osteomyelitis, right ankle and foot: Secondary | ICD-10-CM

## 2019-05-09 DIAGNOSIS — E1051 Type 1 diabetes mellitus with diabetic peripheral angiopathy without gangrene: Secondary | ICD-10-CM

## 2019-05-09 NOTE — Progress Notes (Signed)
[name], Murillo ([id number]) Visit Report for 05/09/2019 Arrival Information Details Patient Name: Date of Service: Lindsey Murillo, Lindsey Murillo 05/09/2019 4:30 PM Medical Record [id number] Patient Account Number: [id number] Date of Birth/Sex: Treating RN: November 06, 1995 (23 y.o. Orvan Falconer Primary Care Iesha Summerhill: Baltazar Apo Other Clinician: Referring Augustine Brannick: Treating Emrys Mckamie/Extender:Robson, Evlyn Kanner, [place] Dom in Treatment: 7 Visit Information History Since Last Visit All ordered tests and consults were completed: No Patient Arrived: Ambulatory Added or deleted any medications: No Arrival Time: 16:25 Any new allergies or adverse reactions: No Accompanied By: self Had a fall or experienced change in No Transfer Assistance: None activities of daily living that may affect Patient Identification Verified: Yes risk of falls: Secondary Verification Process Yes Signs or symptoms of abuse/neglect since last No Completed: visito Patient Requires Transmission-Based No Hospitalized since last visit: No Precautions: Implantable device outside of the clinic excluding No Patient Has Alerts: No cellular tissue based products placed in the center since last visit: Has Dressing in Place as Prescribed: Yes Has Compression in Place as Prescribed: No Pain Present Now: No Electronic Signature(s) Signed: 05/09/2019 5:40:54 PM By: Carlene Coria RN Entered By: Carlene Coria on 05/09/2019 16:26:17 -------------------------------------------------------------------------------- Compression Therapy Details Patient Name: Date of Service: Lindsey Murillo, Lindsey Murillo 05/09/2019 4:30 PM Medical Record [id number] Patient Account Number: [id number] Date of Birth/Sex: Treating RN: 23-Dec-1995 (23 y.o. Orvan Falconer Primary Care Lindsey Murillo: Baltazar Apo Other Clinician: Referring Burlon Centrella: Treating Wynne Rozak/Extender:Robson, Evlyn Kanner, [place] Dom in Treatment:  7 Compression Therapy Performed for Wound Wound #4 Right,Distal,Plantar Foot Assessment: Performed By: Jake Church, RN Compression Type: Three Layer Electronic Signature(s) Signed: 05/09/2019 5:40:54 PM By: Carlene Coria RN Entered By: Carlene Coria on 05/09/2019 16:31:59 -------------------------------------------------------------------------------- Compression Therapy Details Patient Name: Date of Service: Lindsey Murillo, Lindsey Murillo 05/09/2019 4:30 PM Medical Record [id number] Patient Account Number: [id number] Date of Birth/Sex: Treating RN: [birth year] (23 y.o. Orvan Falconer Primary Care Esra Frankowski: Baltazar Apo Other Clinician: Referring Jasey Cortez: Treating Avry Monteleone/Extender:Robson, Evlyn Kanner, [place] Dom in Treatment: 7 Compression Therapy Performed for Wound Wound #8 Right,Anterior Lower Leg Assessment: Performed By: Jake Church, RN Compression Type: Three Layer Electronic Signature(s) Signed: 05/09/2019 5:40:54 PM By: Carlene Coria RN Entered By: Carlene Coria on 05/09/2019 16:31:59 -------------------------------------------------------------------------------- Compression Therapy Details Patient Name: Date of Service: Lindsey Murillo, Lindsey Murillo 05/09/2019 4:30 PM Medical Record [id number] Patient Account Number: [id number] Date of Birth/Sex: Treating RN: May 02, 1996 (23 y.o. Orvan Falconer Primary Care Tashonda Pinkus: Baltazar Apo Other Clinician: Referring Lindsey Murillo: Treating Kester Stimpson/Extender:Robson, Evlyn Kanner, [place] Dom in Treatment: 7 Compression Therapy Performed for Wound Wound #7 Right,Proximal,Plantar Foot Assessment: Performed By: Jake Church, RN Compression Type: Three Layer Electronic Signature(s) Signed: 05/09/2019 5:40:54 PM By: Carlene Coria RN Entered By: Carlene Coria on 05/09/2019 16:32:00 -------------------------------------------------------------------------------- Encounter Discharge Information  Details Patient Name: Date of Service: Lindsey Murillo, Lindsey Murillo 05/09/2019 4:30 PM Medical Record [id number] Patient Account Number: [id number] Date of Birth/Sex: Treating RN: [birth year] (23 y.o. Orvan Falconer Primary Care Zlatan Hornback: Baltazar Apo Other Clinician: Referring Anzley Dibbern: Treating Nyair Depaulo/Extender:Robson, Evlyn Kanner, [place] Dom in Treatment: 7 Encounter Discharge Information Items Discharge Condition: Stable Ambulatory Status: Ambulatory Discharge Destination: Home Transportation: Private Auto Accompanied By: self Schedule Follow-up Appointment: Yes Clinical Summary of Care: Patient Declined Electronic Signature(s) Signed: 05/09/2019 5:40:54 PM By: Carlene Coria RN Entered By: Carlene Coria on 05/09/2019 16:34:21 -------------------------------------------------------------------------------- Patient/Caregiver Education Details Diedrich, Malayiah 12/30/2020andnbsp4:30 Patient Name: Date of Service: [name] Medical Record Patient Account Number: [id number] Number: Treating RN: Carlene Coria Date of Birth/Gender: [birth year] (23 y.o. F) Other  Clinician: Primary Care Treating Baird Kay Physician: Physician/Extender: Referring Physician: Marylin Crosby in Treatment: 7 Education Assessment Education Provided To: Patient Education Topics Provided Wound/Skin Impairment: Methods: Explain/Verbal Responses: State content correctly Electronic Signature(s) Signed: 05/09/2019 5:40:54 PM By: Carlene Coria RN Entered By: Carlene Coria on 05/09/2019 16:34:01 -------------------------------------------------------------------------------- Wound Assessment Details Patient Name: Date of Service: Lindsey Murillo, Lindsey Murillo 05/09/2019 4:30 PM Medical Record [id number] Patient Account Number: [id number] Date of Birth/Sex: Treating RN: 1995-10-28 (23 y.o. Orvan Falconer Primary Care Kaelan Emami: Baltazar Apo Other  Clinician: Referring Aries Townley: Treating Sim Choquette/Extender:Robson, Evlyn Kanner, [place] Dom in Treatment: 7 Wound Status Wound Number: 4 Primary Diabetic Wound/Ulcer of the Lower Etiology: Extremity Wound Location: Right Foot - Plantar, Distal Wound Status: Open Wounding Event: Gradually Appeared Comorbid Anemia, Type I Diabetes Date Acquired: 01/09/2019 History: Weeks Of Treatment: 7 Clustered Wound: No Wound Measurements Length: (cm) 0.2 Width: (cm) 0.2 Depth: (cm) 1.4 Area: (cm) 0.031 Volume: (cm) 0.044 Wound Description Classification: Grade 3 Wound Margin: Well defined, not attached Exudate Amount: Medium Exudate Type: Serosanguineous Exudate Color: red, brown Wound Bed Granulation Amount: Small (1-33%) Granulation Quality: Pink Necrotic Amount: None Present (0%) After Cleansing: No brino No Exposed Structure osed: No (Subcutaneous Tissue) Exposed: Yes osed: No osed: No sed: No ed: No % Reduction in Area: 99.4% % Reduction in Volume: 96% Epithelialization: Small (1-33%) Tunneling: No Undermining: No Foul Odor Slough/Fi Fascia Exp Fat Layer Tendon Exp Muscle Exp Joint Expo Bone Expos Treatment Notes Wound #4 (Right, Distal, Plantar Foot) 1. Cleanse With Wound Cleanser Soap and water 3. Primary Dressing Applied Calcium Alginate Ag 4. Secondary Dressing ABD Pad Dry Gauze 6. Support Layer Applied 3 layer compression wrap Notes netting Electronic Signature(s) Signed: 05/09/2019 5:40:54 PM By: Carlene Coria RN Entered By: Carlene Coria on 05/09/2019 16:31:00 -------------------------------------------------------------------------------- Wound Assessment Details Patient Name: Date of Service: MAKEL, HALLIGAN 05/09/2019 4:30 PM Medical Record [id number] Patient Account Number: [id number] Date of Birth/Sex: Treating RN: 1995/10/23 (23 y.o. Orvan Falconer Primary Care Taralynn Quiett: Baltazar Apo Other Clinician: Referring  Saja Bartolini: Treating Ariyan Brisendine/Extender:Robson, Evlyn Kanner, [place] Dom in Treatment: 7 Wound Status Wound Number: 7 Primary Diabetic Wound/Ulcer of the Lower Etiology: Extremity Wound Location: Right Foot - Plantar, Proximal Wound Status: Open Wounding Event: Gradually Appeared Comorbid Anemia, Type I Diabetes Date Acquired: 01/09/2019 History: Weeks Of Treatment: 7 Clustered Wound: No Wound Measurements Length: (cm) 0.4 Width: (cm) 0.3 Depth: (cm) 0.3 Area: (cm) 0.094 Volume: (cm) 0.028 Wound Description Classification: Grade 2 Wound Margin: Well defined, not attached Exudate Amount: Small Exudate Type: Serosanguineous Exudate Color: red, brown Wound Bed Granulation Amount: Large (67-100%) Granulation Quality: Red Necrotic Amount: None Present (0%) After Cleansing: No rino No Exposed Structure sed: No Subcutaneous Tissue) Exposed: Yes sed: No sed: No ed: No d: No % Reduction in Area: 78.6% % Reduction in Volume: 90.9% Epithelialization: Small (1-33%) Tunneling: No Undermining: No Foul Odor Slough/Fib Fascia Expo Fat Layer ( Tendon Expo Muscle Expo Joint Expos Bone Expose Treatment Notes Wound #7 (Right, Proximal, Plantar Foot) 1. Cleanse With Wound Cleanser Soap and water 3. Primary Dressing Applied Calcium Alginate Ag 4. Secondary Dressing ABD Pad Dry Gauze 6. Support Layer Applied 3 layer compression wrap Notes netting Electronic Signature(s) Signed: 05/09/2019 5:40:54 PM By: Carlene Coria RN Entered By: Carlene Coria on 05/09/2019 16:31:07 -------------------------------------------------------------------------------- Wound Assessment Details Patient Name: Date of Service: Lindsey Murillo, Lindsey Murillo 05/09/2019 4:30 PM Medical Record [id number] Patient Account Number: [id number] Date of Birth/Sex: Treating RN: 1995-05-15 (23 y.o. F) Epps, Morey Hummingbird  Primary Care Ernestene Coover: Baltazar Apo Other Clinician: Referring Dalaysia Harms: Treating  Arelys Glassco/Extender:Robson, Evlyn Kanner, [place] Dom in Treatment: 7 Wound Status Wound Number: 8 Primary Diabetic Wound/Ulcer of the Lower Etiology: Extremity Wound Location: Right Lower Leg - Anterior Secondary Lymphedema Wounding Event: Blister Etiology: Date Acquired: 03/18/2019 Wound Status: Open Weeks Of Treatment: 7 Comorbid Anemia, Type I Diabetes Clustered Wound: No History: Wound Measurements Length: (cm) 2.5 % Reduction Width: (cm) 2.7 % Reduction Depth: (cm) 0.1 Epitheliali Area: (cm) 5.301 Tunneling: Volume: (cm) 0.53 Underminin Wound Description Classification: Grade 1 Foul Odor Wound Margin: Flat and Intact Slough/Fib Exudate Amount: Medium Exudate Type: Serosanguineous Exudate Color: red, brown Wound Bed Granulation Amount: Large (67-100%) Granulation Quality: Red Fasc Necrotic Amount: Small (1-33%) Fat Necrotic Quality: Adherent Slough Tend Musc Join Bone After Cleansing: No rino Yes Exposed Structure ia Exposed: No Layer (Subcutaneous Tissue) Exposed: Yes on Exposed: No le Exposed: No t Exposed: No Exposed: No in Area: 9.3% in Volume: 9.2% zation: Small (1-33%) No g: No Treatment Notes Wound #8 (Right, Anterior Lower Leg) 1. Cleanse With Wound Cleanser 3. Primary Dressing Applied Iodoflex 4. Secondary Dressing ABD Pad Dry Gauze 6. Support Layer Applied 3 layer compression wrap Notes netting Electronic Signature(s) Signed: 05/09/2019 5:40:54 PM By: Carlene Coria RN Entered By: Carlene Coria on 05/09/2019 16:31:15 -------------------------------------------------------------------------------- [place] Details Patient Name: Date of Service: Lindsey Murillo. 05/09/2019 4:30 PM Medical Record [id number] Patient Account Number: [id number] Date of Birth/Sex: Treating RN: 08-20-1995 (23 y.o. Orvan Falconer Primary Care Cassady Stanczak: Baltazar Apo Other Clinician: Referring Dangela How: Treating Alexandrya Chim/Extender:Robson,  Evlyn Kanner, [place] Dom in Treatment: 7 Vital Signs Time Taken: 16:26 Temperature (F): 97.9 Height (in): 68 Pulse (bpm): 113 Weight (lbs): 137 Respiratory Rate (breaths/min): 18 Body Mass Index (BMI): 20.8 Blood Pressure (mmHg): 141/100 Reference Range: 80 - 120 mg / dl Notes patient denies lightedness/dizziness, blurred vision, ringing of ears, nose bleed , patient oriented times 3 , notified Dr Senaida Ores Electronic Signature(s) Signed: 05/09/2019 5:40:54 PM By: Carlene Coria RN Entered By: Carlene Coria on 05/09/2019 16:30:29

## 2019-05-09 NOTE — Progress Notes (Signed)
Office Visit Note   Patient: Lindsey Murillo           Date of Birth: [birth year]           MRN: [id number] Visit Date: 05/09/2019              Requested by: Mikey Kirschner, [address] [place],  [place] [address] PCP: Mikey Kirschner, MD  Chief Complaint  Patient presents with  . Right Foot - Pain, New Patient (Initial Visit)      HPI: This is a pleasant 23 year old woman referred from wound care and Dr. Dellia Nims for evaluation of her right lower extremity.  She notes that she has had a draining pinpoint wound at the front of her foot since about March.  She also has an ulcer ulcer on her anterior shin.  She does not smoke that she has been a diabetic since she was about 23 years old.  Her most recent hemoglobin A1c from August was greater than 15.5.  She does say she has been working on better glycemic control but does not know any recent A1c's and thinks it may still be high.  She is seeing wound care and is currently taking doxycycline  Assessment & Plan: Visit Diagnoses:  1. Type 1 diabetes mellitus with peripheral circulatory complications ([name])     Plan: This is certainly a difficult problem in young woman.  I will redraw an A1c today.  I think it is important for her to follow-up on Monday with Dr. Sharol Given to review her MRIs her A1c and a plan moving forward  Follow-Up Instructions: No follow-ups on file.   Ortho Exam  Patient is alert, oriented, no adenopathy, well-dressed, normal affect, normal respiratory effort. Focused examination of her right lower extremity demonstrates a 6 cm diameter ulcer on her right anterior shin there is some fibrous tissue and it is at skin surface.  There is no foul odor or surrounding cellulitis On her right foot she does have a palpable dorsalis pedis pulse.  She has some skin delamination.  At the tip of her foot she has a pinpoint area of actively draining purulent drainage MRI of her tibia was reviewed did not show any  evidence of osteomyelitis.  MRI of her right foot does demonstrate osteo of the fifth metatarsal base and proximal phalanx.  Also osteoconcerns in the medial cuneiform  Imaging: No results found.    Labs: Lab Results  Component Value Date   HGBA1C >15.5 12/13/2018   HGBA1C >13 06/09/2018   HGBA1C 13.4 (H) 11/05/2015   ESRSEDRATE 13 07/15/2016   CRP 18.4 (H) 07/15/2016   REPTSTATUS 04/15/2019 FINAL 04/11/2019   GRAMSTAIN  04/11/2019    RARE WBC PRESENT, PREDOMINANTLY PMN MODERATE GRAM POSITIVE COCCI IN PAIRS IN CLUSTERS FEW GRAM NEGATIVE RODS    CULT  04/11/2019    ABUNDANT SERRATIA MARCESCENS MODERATE GROUP B STREP(S.AGALACTIAE)ISOLATED TESTING AGAINST S. AGALACTIAE NOT ROUTINELY PERFORMED DUE TO PREDICTABILITY OF AMP/PEN/VAN SUSCEPTIBILITY. Performed at [facility], [place] [address], [place], [place] [address]    LABORGA SERRATIA MARCESCENS 04/11/2019     Lab Results  Component Value Date   ALBUMIN 3.8 04/12/2017   ALBUMIN 3.6 04/10/2017   ALBUMIN 3.4 (L) 03/26/2016    Lab Results  Component Value Date   MG 1.3 (L) 04/12/2017   Lab Results  Component Value Date   VD25OH 4 12/13/2018    No results found for: PREALBUMIN CBC EXTENDED Latest Ref  Rng & Units 04/13/2019 05/27/2018 04/12/2017  WBC 4.0 - 10.5 K/uL 7.9 10.3 9.1  RBC 3.87 - 5.11 MIL/uL 3.07(L) 4.61 4.88  HGB 12.0 - 15.0 g/dL 8.2(L) 12.2 13.7  HCT 36.0 - 46.0 % 26.5(L) 38.4 40.8  PLT 150 - 400 K/uL 285 232 227  NEUTROABS 1.7 - 7.7 K/uL 5.6 7.9(H) 7.2  LYMPHSABS 0.7 - 4.0 K/uL 1.5 1.8 1.6     Body mass index is 19.2 kg/m.  Orders:  Orders Placed This Encounter  Procedures  . HgB A1c   No orders of the defined types were placed in this encounter.    Procedures: No procedures performed  Clinical Data: No additional findings.  ROS:  All other systems negative, except as noted in the HPI. Review of Systems  Objective: Vital Signs: Ht 5\' 9"  (1.753 m)   Wt 130 lb (59 kg)   BMI  19.20 kg/m   Specialty Comments:  No specialty comments available.  PMFS History: Patient Active Problem List   Diagnosis Date Noted  . Vitamin D deficiency 03/20/2019  . Dyspepsia   . Weight loss, unintentional 08/26/2016  . Chronic diarrhea 04/09/2016  . 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 Noncompliance 02/26/2014  . Essential hypertension, benign 11/30/2012  . Acanthosis nigricans, acquired   . Hypoglycemia associated with diabetes ([place])   . Goiter   . Tachycardia   . Diabetic autonomic neuropathy ([place])   . Diabetic peripheral neuropathy ([place])   . Thyroiditis, autoimmune   . Asthma   . Environmental allergies    Past Medical History:  Diagnosis Date  . Acanthosis nigricans, acquired   . Asthma   . Diabetic autonomic neuropathy ([place])   . Diabetic peripheral neuropathy ([place])   . Environmental allergies   . Goiter   . Hypoglycemia associated with diabetes ([place])   . Tachycardia   . Thyroiditis, autoimmune   . Type 1 diabetes mellitus in patient age 40-19 years with HbA1C goal below 7.5     Family History  Problem Relation Age of Onset  . Diabetes Mother        Type II DM  . Thyroid disease Mother   . Diabetes Maternal Grandmother        Type II DM  . Diabetes Cousin        Type II DM  . Colon cancer Neg Hx   . Colon polyps Neg Hx     Past Surgical History:  Procedure Laterality Date  . BIOPSY  08/27/2016   Procedure: BIOPSY;  Surgeon: Danie Binder, MD;  Location: [facility];  Service: Endoscopy;;  duodenum; gastric  . COLONOSCOPY    . ESOPHAGOGASTRODUODENOSCOPY N/A 08/27/2016   Dr. Oneida Alar: mild gastritis. Negative celiac. No obvious source for dyspepsia/diarrhea   Social History   Occupational History  . Not on file  Tobacco Use  . Smoking status: Never Smoker  . Smokeless tobacco: Never Used  Substance and Sexual Activity  . Alcohol use: No  . Drug use: No  . Sexual activity: Never    Birth  control/protection: Abstinence

## 2019-05-10 LAB — HEMOGLOBIN A1C
Hgb A1c MFr Bld: 9.3 % of total Hgb — ABNORMAL HIGH (ref <5.7)
Mean Plasma Glucose: 220 (calc)
eAG (mmol/L): 12.2 (calc)

## 2019-05-10 NOTE — Progress Notes (Signed)
JAKIYAH, ROSSER ([id number]) Visit Report for 05/09/2019 SuperBill Details Patient Name: Date of Service: Lindsey Murillo, Lindsey Murillo 05/09/2019 Medical Record [id number] Patient Account Number: [id number] Date of Birth/Sex: Treating RN: 05-16-1995 (23 y.o. Orvan Falconer Primary Care Provider: Baltazar Apo Other Clinician: Referring Provider: Treating Provider/Extender:Kalisi Bevill, Evlyn Kanner, [place] Dom in Treatment: 7 Diagnosis Coding ICD-10 Codes Code Description E10.621 Type 1 diabetes mellitus with foot ulcer M86.471 Chronic osteomyelitis with draining sinus, right ankle and foot E10.42 Type 1 diabetes mellitus with diabetic polyneuropathy L97.518 Non-pressure chronic ulcer of other part of right foot with other specified severity L97.512 Non-pressure chronic ulcer of other part of right foot with fat layer exposed L97.811 Non-pressure chronic ulcer of other part of right lower leg limited to breakdown of skin I89.0 Lymphedema, not elsewhere classified Facility Procedures CPT4 Code Description Modifier Quantity [id number] (Facility Use Only) [phone] - APPLY MULTLAY COMPRS LWR RT LEG 1 Electronic Signature(s) Signed: 05/09/2019 5:40:54 PM By: Carlene Coria RN Signed: 05/10/2019 2:35:12 PM By: Linton Ham MD Entered By: Carlene Coria on 05/09/2019 16:34:40

## 2019-05-14 ENCOUNTER — Encounter: Payer: Self-pay | Admitting: Orthopedic Surgery

## 2019-05-14 ENCOUNTER — Other Ambulatory Visit: Payer: Self-pay

## 2019-05-14 ENCOUNTER — Ambulatory Visit (INDEPENDENT_AMBULATORY_CARE_PROVIDER_SITE_OTHER): Payer: PRIVATE HEALTH INSURANCE | Admitting: Orthopedic Surgery

## 2019-05-14 VITALS — Ht 69.0 in | Wt 130.0 lb

## 2019-05-14 DIAGNOSIS — M869 Osteomyelitis, unspecified: Secondary | ICD-10-CM | POA: Diagnosis not present

## 2019-05-14 DIAGNOSIS — I83019 Varicose veins of right lower extremity with ulcer of unspecified site: Secondary | ICD-10-CM

## 2019-05-14 DIAGNOSIS — L97919 Non-pressure chronic ulcer of unspecified part of right lower leg with unspecified severity: Secondary | ICD-10-CM

## 2019-05-14 DIAGNOSIS — L97511 Non-pressure chronic ulcer of other part of right foot limited to breakdown of skin: Secondary | ICD-10-CM

## 2019-05-14 NOTE — Progress Notes (Signed)
Lindsey Murillo, Lindsey Murillo ([id number]) Visit Report for 05/08/2019 Arrival Information Details Patient Name: Date of Service: Lindsey Murillo, Lindsey Murillo 05/08/2019 3:30 PM Medical Record [id number] Patient Account Number: [id number] Date of Birth/Sex: Treating RN: October 13, 1995 (24 y.o. Hollie Salk, Larene Beach Primary Care Dink Creps: Baltazar Apo Other Clinician: Referring Odel Schmid: Treating Raiana Pharris/Extender:Robson, Evlyn Kanner, [place] Dom in Treatment: 7 Visit Information History Since Last Visit Added or deleted any medications: No Patient Arrived: Ambulatory Any new allergies or adverse reactions: No Arrival Time: 16:23 Had a fall or experienced change in No Accompanied By: self activities of daily living that may affect Transfer Assistance: None risk of falls: Patient Identification Verified: Yes Signs or symptoms of abuse/neglect since last No Secondary Verification Process Completed: Yes visito Patient Requires Transmission-Based No Implantable device outside of the clinic excluding No Precautions: cellular tissue based products placed in the center Patient Has Alerts: No since last visit: Has Dressing in Place as Prescribed: Yes Has Compression in Place as Prescribed: Yes Pain Present Now: No Electronic Signature(s) Signed: 05/14/2019 4:32:10 PM By: Kela Millin Entered By: Kela Millin on 05/08/2019 16:23:41 -------------------------------------------------------------------------------- Compression Therapy Details Patient Name: Lindsey Castle R. Date of Service: 05/08/2019 3:30 PM Medical Record [id number] Patient Account Number: [id number] Date of Birth/Sex: Treating RN: [birth year] (24 y.o. Orvan Falconer Primary Care [place]: Baltazar Apo Other Clinician: Referring Kyshon Tolliver: Treating Demetri Goshert/Extender:Robson, Evlyn Kanner, [place] Dom in Treatment: 7 Compression Therapy Performed for Wound Wound #4 Right,Distal,Plantar  Foot Assessment: Performed By: Jake Church, RN Compression Type: Three Layer Post Procedure Diagnosis Same as Pre-procedure Electronic Signature(s) Signed: 05/08/2019 5:43:27 PM By: Carlene Coria RN Entered By: Carlene Coria on 05/08/2019 17:04:01 -------------------------------------------------------------------------------- Compression Therapy Details Patient Name: Lindsey Castle R. Date of Service: 05/08/2019 3:30 PM Medical Record [id number] Patient Account Number: [id number] Date of Birth/Sex: Treating RN: 04/23/1996 (24 y.o. Orvan Falconer Primary Care Leyah Bocchino: Baltazar Apo Other Clinician: Referring Salwa Bai: Treating Treyven Lafauci/Extender:Robson, Evlyn Kanner, [place] Dom in Treatment: 7 Compression Therapy Performed for Wound Wound #7 Right,Proximal,Plantar Foot Assessment: Performed By: Jake Church, RN Compression Type: Three Layer Post Procedure Diagnosis Same as Pre-procedure Electronic Signature(s) Signed: 05/08/2019 5:43:27 PM By: Carlene Coria RN Entered By: Carlene Coria on 05/08/2019 17:04:01 -------------------------------------------------------------------------------- Compression Therapy Details Patient Name: Lindsey Castle R. Date of Service: 05/08/2019 3:30 PM Medical Record [id number] Patient Account Number: [id number] Date of Birth/Sex: Treating RN: 23-Sep-1995 (24 y.o. Orvan Falconer Primary Care Javares Kaufhold: Baltazar Apo Other Clinician: Referring Darey Hershberger: Treating Omari Mcmanaway/Extender:Robson, Evlyn Kanner, [place] Dom in Treatment: 7 Compression Therapy Performed for Wound Wound #8 Right,Anterior Lower Leg Assessment: Performed By: Jake Church, RN Compression Type: Three Layer Post Procedure Diagnosis Same as Pre-procedure Electronic Signature(s) Signed: 05/08/2019 5:43:27 PM By: Carlene Coria RN Entered By: Carlene Coria on 05/08/2019  17:04:01 -------------------------------------------------------------------------------- Encounter Discharge Information Details Patient Name: Date of Service: Lindsey Hew. 05/08/2019 3:30 PM Medical Record [id number] Patient Account Number: [id number] Date of Birth/Sex: Treating RN: 08/14/1995 (24 y.o. Nancy Fetter Primary Care Bethene Hankinson: Baltazar Apo Other Clinician: Referring Nusayba Cadenas: Treating Jenesis Suchy/Extender:Robson, Evlyn Kanner, [place] Dom in Treatment: 7 Encounter Discharge Information Items Discharge Condition: Stable Ambulatory Status: Ambulatory Discharge Destination: Home Transportation: Private Auto Accompanied By: alone Schedule Follow-up Appointment: Yes Clinical Summary of Care: Patient Declined Notes BP and pulse rechecked 141/103, pulse 118. MD notified. Electronic Signature(s) Signed: 05/10/2019 3:10:57 PM By: Levan Hurst RN, BSN Entered By: Levan Hurst on 05/08/2019 17:29:14 -------------------------------------------------------------------------------- Lower Extremity Assessment Details Patient Name: Date of Service: Lindsey Hew 05/08/2019 3:30 PM Medical Record [id number]  Patient Account Number: [id number] Date of Birth/Sex: Treating RN: Nov 10, 1995 (24 y.o. Clearnce Sorrel Primary Care Mackenzy Grumbine: Baltazar Apo Other Clinician: Referring Melisha Eggleton: Treating Jennafer Gladue/Extender:Robson, Evlyn Kanner, [place] Dom in Treatment: 7 Edema Assessment Assessed: [Left: No] [Right: No] Edema: [Left: Ye] [Right: s] Calf Left: Right: Point of Measurement: cm From Medial Instep cm 28 cm Ankle Left: Right: Point of Measurement: cm From Medial Instep cm 21.3 cm Vascular Assessment Pulses: Dorsalis Pedis Palpable: [Right:Yes] Electronic Signature(s) Signed: 05/14/2019 4:32:10 PM By: Kela Millin Entered By: Kela Millin on 05/08/2019  16:32:24 -------------------------------------------------------------------------------- Multi Wound Chart Details Patient Name: Date of Service: Lindsey Hew. 05/08/2019 3:30 PM Medical Record [id number] Patient Account Number: [id number] Date of Birth/Sex: Treating RN: [birth year] (24 y.o. F) Primary Care Hema Lanza: Baltazar Apo Other Clinician: Referring Myndi Wamble: Treating Aika Brzoska/Extender:Robson, Evlyn Kanner, [place] Dom in Treatment: 7 Vital Signs Height(in): 68 Capillary Blood 75 Glucose(mg/dl): Weight(lbs): 137 Pulse(bpm): 121 Body Mass Index(BMI): 21 Blood Pressure(mmHg): 132/103 Temperature(F): 98.1 Respiratory 18 Rate(breaths/min): Photos: [4:No Photos] [7:No Photos] [8:No Photos] Wound Location: [4:Right Foot - Plantar, Distal Right Foot - Plantar,] [7:Proximal] [8:Right Lower Leg - Anterior] Wounding Event: [4:Gradually Appeared] [7:Gradually Appeared] [8:Blister] 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] [7:Lower Extremity] [8:Lower Extremity] Secondary Etiology: [4:N/A] [7:N/A] [8:Lymphedema] Comorbid History: [4:Anemia, Type I Diabetes Anemia, Type I Diabetes Anemia, Type I Diabetes] Date Acquired: [4:01/09/2019] [7:01/09/2019] [8:03/18/2019] Weeks of Treatment: [4:7] [7:7] [8:7] Wound Status: [4:Open] [7:Open] [8:Open] Measurements L x W x D 0.2x0.2x1.4 [7:0.4x0.3x0.3] [8:2.5x2.7x0.1] (cm) Area (cm) : [4:0.031] [7:0.094] [8:5.301] Volume (cm) : [4:0.044] [7:0.028] [8:0.53] % Reduction in Area: [4:99.40%] [7:78.60%] [8:9.30%] % Reduction in Volume: 96.00% [7:90.90%] [8:9.20%] Position 1 (o'clock): 9 Maximum Distance 1 [4:1.5] (cm): Position 2 (o'clock): [4:3] Maximum Distance 2 [4:2.2] (cm): Tunneling: [4:Yes] [7:No] [8:No] Classification: [4:Grade 3] [7:Grade 2] [8:Grade 1] Exudate Amount: [4:Medium] [7:Small] [8:Medium] Exudate Type:  [4:Serosanguineous] [7:Serosanguineous] [8:Serosanguineous] Exudate Color: [4:red, brown] [7:red, brown] [8:red, brown] Wound Margin: [4:Well defined, not attached] [7:Well defined, not attached] [8:Flat and Intact] Granulation Amount: [4:Small (1-33%)] [7:Large (67-100%)] [8:Large (67-100%)] Granulation Quality: [4:Pink] [7:Red] [8:Red] Necrotic Amount: [4:None Present (0%)] [7:None Present (0%)] [8:Small (1-33%)] Exposed Structures: [4:Fat Layer (Subcutaneous Tissue) Exposed: Yes Fascia: No Tendon: No Muscle: No Joint: No Bone: No] [7:Fat Layer (Subcutaneous Tissue) Exposed: Yes Fascia: No Tendon: No Muscle: No Joint: No Bone: No] [8:Fat Layer (Subcutaneous Tissue) Exposed: Yes  Fascia: No Tendon: No Muscle: No Joint: No Bone: No] Epithelialization: [4:Small (1-33%) Compression Therapy] [7:Small (1-33%) Compression Therapy] [8:Small (1-33%) Compression Therapy] Treatment Notes Wound #4 (Right, Distal, Plantar Foot) 1. Cleanse With Soap and water 3. Primary Dressing Applied Calcium Alginate Ag 4. Secondary Dressing Dry Gauze 6. Support Layer Applied 3 layer compression wrap Wound #7 (Right, Proximal, Plantar Foot) 1. Cleanse With Soap and water 3. Primary Dressing Applied Calcium Alginate Ag 4. Secondary Dressing Dry Gauze 6. Support Layer Applied 3 layer compression wrap Wound #8 (Right, Anterior Lower Leg) 1. Cleanse With Soap and water 3. Primary Dressing Applied Iodoflex 4. Secondary Dressing ABD Pad Dry Gauze 6. Support Layer Applied 3 layer compression Water quality scientist) Signed: 05/08/2019 6:15:30 PM By: Linton Ham MD Entered By: Linton Ham on 05/08/2019 17:46:53 -------------------------------------------------------------------------------- Multi-Disciplinary Care Plan Details Patient Name: Date of Service: Lindsey Hew. 05/08/2019 3:30 PM Medical Record [id number] Patient Account Number: [id number] Date of Birth/Sex: Treating  RN: October 12, 1995 (24 y.o. Orvan Falconer Primary Care Jnya Brossard: Baltazar Apo Other Clinician: Referring Fianna Snowball: Treating Leesa Leifheit/Extender:Robson,  Evlyn Kanner, Gwyndolyn Saxon Weeks in Treatment: 7 Active Inactive Wound/Skin Impairment Nursing Diagnoses: Knowledge deficit related to ulceration/compromised skin integrity Goals: Patient/caregiver will verbalize understanding of skin care regimen Date Initiated: 03/20/2019 Target Resolution Date: 05/25/2019 Goal Status: Active Ulcer/skin breakdown will have a volume reduction of 30% by week 4 Target Resolution Date Initiated: 03/20/2019 Date Inactivated: 04/24/2019 Date: 04/20/2019 Unmet Goal Status: Unmet Reason: COMORBITIES/ NON COMPLAINCE Ulcer/skin breakdown will have a volume reduction of 50% by week 8 Date Initiated: 04/24/2019 Target Resolution Date: 05/25/2019 Goal Status: Active Interventions: Assess patient/caregiver ability to obtain necessary supplies Assess patient/caregiver ability to perform ulcer/skin care regimen upon admission and as needed Assess ulceration(s) every visit Notes: Electronic Signature(s) Signed: 05/08/2019 5:43:27 PM By: Carlene Coria RN Entered By: Carlene Coria on 05/08/2019 16:08:32 -------------------------------------------------------------------------------- Pain Assessment Details Patient Name: Date of Service: Lindsey Murillo, Lindsey Murillo 05/08/2019 3:30 PM Medical Record [id number] Patient Account Number: [id number] Date of Birth/Sex: Treating RN: [birth year] (24 y.o. Clearnce Sorrel Primary Care Kerline Trahan: Baltazar Apo Other Clinician: Referring Taysom Glymph: Treating Hari Casaus/Extender:Robson, Evlyn Kanner, [place] Dom in Treatment: 7 Active Problems Location of Pain Severity and Description of Pain Patient Has Paino No Site Locations Pain Management and Medication Current Pain Management: Electronic Signature(s) Signed: 05/14/2019 4:32:10 PM By: Kela Millin Entered  By: Kela Millin on 05/08/2019 16:24:42 -------------------------------------------------------------------------------- Patient/Caregiver Education Details Lindsey Murillo, Lindsey Murillo 12/29/2020andnbsp3:30 Patient Name: Date of Service: [name] Medical Record Patient Account Number: [id number] Number: Treating RN: Carlene Coria Date of Birth/Gender: 06-12-1995 (23 y.o. F) Other Clinician: Primary Care Physician:LUKING, Loralyn Freshwater Linton Ham Referring Physician: Physician/Extender: Marylin Crosby in Treatment: 7 Education Assessment Education Provided To: Patient Education Topics Provided Wound/Skin Impairment: Methods: Explain/Verbal Responses: State content correctly Electronic Signature(s) Signed: 05/08/2019 5:43:27 PM By: Carlene Coria RN Entered By: Carlene Coria on 05/08/2019 16:08:48 -------------------------------------------------------------------------------- Wound Assessment Details Patient Name: Date of Service: Lindsey Murillo, Lindsey Murillo 05/08/2019 3:30 PM Medical Record [id number] Patient Account Number: [id number] Date of Birth/Sex: Treating RN: Sep 14, 1995 (24 y.o. Clearnce Sorrel Primary Care Ayaansh Smail: Baltazar Apo Other Clinician: Referring Chessica Audia: Treating Jurell Basista/Extender:Robson, Evlyn Kanner, [place] Dom in Treatment: 7 Wound Status Wound Number: 4 Primary Diabetic Wound/Ulcer of the Lower Etiology: Extremity Wound Location: Right Foot - Plantar, Distal Wound Status: Open Wounding Event: Gradually Appeared Comorbid Anemia, Type I Diabetes Date Acquired: 01/09/2019 History: Weeks Of Treatment: 7 Clustered Wound: No Photos Wound Measurements Length: (cm) 0.2 % Reduc Width: (cm) 0.2 % Reduc Depth: (cm) 1.4 Epithel Area: (cm) 0.031 Tunnel Volume: (cm) 0.044 Loc P M Loca P M Underminin Wound Description Classification: Grade 3 Foul Odor Wound Margin: Well defined, not attached Slough/Fi Exudate Amount:  Medium Exudate Type: Serosanguineous Exudate Color: red, brown Wound Bed Granulation Amount: Small (1-33%) Granulation Quality: Pink Fascia Exp Necrotic Amount: None Present (0%) Fat Layer Tendon Exp Muscle Exp Joint Expo Bone Expos After Cleansing: No brino No Exposed Structure osed: No (Subcutaneous Tissue) Exposed: Yes osed: No osed: No sed: No ed: No tion in Area: 99.4% tion in Volume: 96% ialization: Small (1-33%) ing: Yes ation 1 osition (o'clock): 9 aximum Distance: (cm) 1.5 tion 2 osition (o'clock): 3 aximum Distance: (cm) 2.2 g: No Treatment Notes Wound #4 (Right, Distal, Plantar Foot) 1. Cleanse With Soap and water 3. Primary Dressing Applied Calcium Alginate Ag 4. Secondary Dressing Dry Gauze 6. Support Layer Applied 3 layer compression wrap Electronic Signature(s) Signed: 05/14/2019 4:12:00 PM By: Mikeal Hawthorne EMT/HBOT Signed: 05/14/2019 4:32:10 PM By: Kela Millin Entered By: Mikeal Hawthorne on 05/14/2019 09:46:12 -------------------------------------------------------------------------------- Wound  Assessment Details Patient Name: Date of Service: Lindsey Murillo, Lindsey Murillo 05/08/2019 3:30 PM Medical Record [id number] Patient Account Number: [id number] Date of Birth/Sex: Treating RN: 1995/10/27 (24 y.o. Clearnce Sorrel Primary Care Delitha Elms: Baltazar Apo Other Clinician: Referring Cailan Antonucci: Treating Prisha Hiley/Extender:Robson, Evlyn Kanner, [place] Dom in Treatment: 7 Wound Status Wound Number: 7 Primary Diabetic Wound/Ulcer of the Lower Etiology: Extremity Wound Location: Right Foot - Plantar, Proximal Wound Status: Open Wounding Event: Gradually Appeared Comorbid Anemia, Type I Diabetes Date Acquired: 01/09/2019 History: Weeks Of Treatment: 7 Clustered Wound: No Photos Wound Measurements Length: (cm) 0.4 Width: (cm) 0.3 Depth: (cm) 0.3 Area: (cm) 0.094 Volume: (cm) 0.028 Wound Description Classification: Grade  2 Wound Margin: Well defined, not attached Exudate Amount: Small Exudate Type: Serosanguineous Exudate Color: red, brown Wound Bed Granulation Amount: Large (67-100%) Granulation Quality: Red Necrotic Amount: None Present (0%) fter Cleansing: No ino No Exposed Structure ed: No ubcutaneous Tissue) Exposed: Yes ed: No ed: No d: No : No % Reduction in Area: 78.6% % Reduction in Volume: 90.9% Epithelialization: Small (1-33%) Tunneling: No Undermining: No Foul Odor A Slough/Fibr Fascia Expos Fat Layer (S Tendon Expos Muscle Expos Joint Expose Bone Exposed Treatment Notes Wound #7 (Right, Proximal, Plantar Foot) 1. Cleanse With Soap and water 3. Primary Dressing Applied Calcium Alginate Ag 4. Secondary Dressing Dry Gauze 6. Support Layer Applied 3 layer compression wrap Electronic Signature(s) Signed: 05/14/2019 4:12:00 PM By: Mikeal Hawthorne EMT/HBOT Signed: 05/14/2019 4:32:10 PM By: Kela Millin Entered By: Mikeal Hawthorne on 05/14/2019 09:45:52 -------------------------------------------------------------------------------- Wound Assessment Details Patient Name: Date of Service: Lindsey Hew. 05/08/2019 3:30 PM Medical Record [id number] Patient Account Number: [id number] Date of Birth/Sex: Treating RN: [birth year] (24 y.o. Clearnce Sorrel Primary Care Kainoah Bartosiewicz: Baltazar Apo Other Clinician: Referring Lantz Hermann: Treating Sable Knoles/Extender:Robson, Evlyn Kanner, [place] Dom in Treatment: 7 Wound Status Wound Number: 8 Primary Etiology: Diabetic Wound/Ulcer of the Lower Extremity Wound Location: Right Lower Leg - Anterior Secondary Lymphedema Wounding Event: Blister Etiology: Date Acquired: 03/18/2019 Wound Status: Open Weeks Of Treatment: 7 Comorbid Anemia, Type I Diabetes Clustered Wound: No History: Photos Wound Measurements Length: (cm) 2.5 % Reduction in A Width: (cm) 2.7 % Reduction in V Depth: (cm) 0.1  Epithelializatio Area: (cm) 5.301 Tunneling: Volume: (cm) 0.53 Undermining: Wound Description Classification: Grade 1 Foul Odor After Wound Margin: Flat and Intact Slough/Fibrino Exudate Amount: Medium Exudate Type: Serosanguineous Exudate Color: red, brown Wound Bed Granulation Amount: Large (67-100%) Granulation Quality: Red Fascia Exposed: Necrotic Amount: Small (1-33%) Fat Layer (Subcu Necrotic Quality: Adherent Slough Tendon Exposed: Muscle Exposed: Joint Exposed: Bone Exposed: Cleansing: No Yes Exposed Structure No taneous Tissue) Exposed: Yes No No No No rea: 9.3% olume: 9.2% n: Small (1-33%) No No Treatment Notes Wound #8 (Right, Anterior Lower Leg) 1. Cleanse With Soap and water 3. Primary Dressing Applied Iodoflex 4. Secondary Dressing ABD Pad Dry Gauze 6. Support Layer Applied 3 layer compression wrap Electronic Signature(s) Signed: 05/14/2019 4:12:00 PM By: Mikeal Hawthorne EMT/HBOT Signed: 05/14/2019 4:32:10 PM By: Kela Millin Entered By: Mikeal Hawthorne on 05/14/2019 09:46:33 -------------------------------------------------------------------------------- Vitals Details Patient Name: Date of Service: Lindsey Hew. 05/08/2019 3:30 PM Medical Record [id number] Patient Account Number: [id number] Date of Birth/Sex: Treating RN: Apr 28, 1996 (24 y.o. Clearnce Sorrel Primary Care Paisley Grajeda: Baltazar Apo Other Clinician: Referring Terran Hollenkamp: Treating Dayson Aboud/Extender:Robson, Evlyn Kanner, [place] Dom in Treatment: 7 Vital Signs Time Taken: 16:23 Temperature (F): 98.1 Height (in): 68 Pulse (bpm): 121 Weight (lbs): 137 Respiratory Rate (breaths/min): 18 Body Mass Index (BMI): 20.8 Blood Pressure (mmHg): 132/103  Capillary Blood Glucose (mg/dl): 75 Reference Range: 80 - 120 mg / dl Notes patient stated that CBG was 75. No s/s of headache or dizziness and stated her BP and HR was always like that. MD  notified Electronic Signature(s) Signed: 05/14/2019 4:32:10 PM By: Kela Millin Entered By: Kela Millin on 05/08/2019 17:20:04

## 2019-05-14 NOTE — Progress Notes (Signed)
Office Visit Note   Patient: Lindsey Murillo           Date of Birth: 01/19/1996           MRN: [id number] Visit Date: 05/14/2019              Requested by: Mikey Kirschner, [address] [place],  [place] [address] PCP: Mikey Kirschner, MD  Chief Complaint  Patient presents with  . Right Foot - Follow-up      HPI: Patient is a 24 year old woman with type 1 diabetes who was seen for initial evaluation for osteomyelitis ulceration abscess right foot.  Patient is currently been treated at the wound center has recently had an MRI scan.  She is currently undergoing serial compression wraps for the venous insufficiency ulcer.  Patient's hemoglobin A1c in December was 9.3 this is improved from 15.5 in August.  Patient is currently undergoing wound care at the wound center.  Assessment & Plan: Visit Diagnoses:  1. Osteomyelitis of fifth toe of right foot ([place])   2. Non-pressure chronic ulcer of other part of right foot limited to breakdown of skin ([place])   3. Venous ulcer of right leg ([name])     Plan: Discussed with the patient that we could continue to observe her foot.  She will continue with the compression wraps at the wound center she will complete her course of doxycycline minimize weightbearing on the right foot.  Follow-Up Instructions: Return in about 4 weeks (around 06/11/2019).   Ortho Exam  Patient is alert, oriented, no adenopathy, well-dressed, normal affect, normal respiratory effort. Examination patient has a strong dorsalis pedis pulse.  Her foot is plantigrade she has a healing venous ulcer on the right leg anteriorly with good healthy granulation tissue.  Patient does have some swelling of the little toe MTP joint but there is no cellulitis no drainage no sausage digit swelling.  She does have some swelling in the forefoot there is a small pinpoint ulcer this was debrided and there was good granulation tissue at the base with no purulent drainage.   There is no tenderness to palpation across the metatarsal heads no fluctuation.  The midfoot is nontender to palpation no redness no cellulitis no ulceration over the medial cuneiform.  Review of the MRI scan shows some edema within the medial cuneiform clinically this appears to be more of a stress reaction than osteomyelitis.  There is some edema at the MTP joint of the little toe and this appears to be consistent with chronic osteomyelitis but no ulceration.  Semination the forefoot there does appear to be some fluid on the MRI scan of the forefoot however this is nontender to palpation no clinical signs of an abscess.  Patient has onychomycotic nails x4 and these were trimmed x4 without complication.  Imaging: No results found. No images are attached to the encounter.  Labs: Lab Results  Component Value Date   HGBA1C 9.3 (H) 05/09/2019   HGBA1C >15.5 12/13/2018   HGBA1C >13 06/09/2018   ESRSEDRATE 13 07/15/2016   CRP 18.4 (H) 07/15/2016   REPTSTATUS 04/15/2019 FINAL 04/11/2019   GRAMSTAIN  04/11/2019    RARE WBC PRESENT, PREDOMINANTLY PMN MODERATE GRAM POSITIVE COCCI IN PAIRS IN CLUSTERS FEW GRAM NEGATIVE RODS    CULT  04/11/2019    ABUNDANT SERRATIA MARCESCENS MODERATE GROUP B STREP(S.AGALACTIAE)ISOLATED TESTING AGAINST S. AGALACTIAE NOT ROUTINELY PERFORMED DUE TO PREDICTABILITY OF AMP/PEN/VAN SUSCEPTIBILITY. Performed at [name], [address] [place], [place] [address]    LABORGA SERRATIA MARCESCENS 04/11/2019     Lab Results  Component Value Date   ALBUMIN 3.8 04/12/2017   ALBUMIN 3.6 04/10/2017   ALBUMIN 3.4 (L) 03/26/2016    Lab Results  Component Value Date   MG 1.3 (L) 04/12/2017   Lab Results  Component Value Date   VD25OH 4 12/13/2018    No results found for: PREALBUMIN CBC EXTENDED Latest Ref Rng & Units 04/13/2019 05/27/2018 04/12/2017  WBC 4.0 - 10.5 K/uL 7.9 10.3 9.1  RBC 3.87 - 5.11 MIL/uL 3.07(L) 4.61 4.88  HGB 12.0 - 15.0 g/dL 8.2(L)  12.2 13.7  HCT 36.0 - 46.0 % 26.5(L) 38.4 40.8  PLT 150 - 400 K/uL 285 232 227  NEUTROABS 1.7 - 7.7 K/uL 5.6 7.9(H) 7.2  LYMPHSABS 0.7 - 4.0 K/uL 1.5 1.8 1.6     Body mass index is 19.2 kg/m.  Orders:  No orders of the defined types were placed in this encounter.  No orders of the defined types were placed in this encounter.    Procedures: No procedures performed  Clinical Data: No additional findings.  ROS:  All other systems negative, except as noted in the HPI. Review of Systems  Objective: Vital Signs: Ht 5\' 9"  (1.753 m)   Wt 130 lb (59 kg)   BMI 19.20 kg/m   Specialty Comments:  No specialty comments available.  PMFS History: Patient Active Problem List   Diagnosis Date Noted  . Vitamin D deficiency 03/20/2019  . Dyspepsia   . Weight loss, unintentional 08/26/2016  . Chronic diarrhea 04/09/2016  . 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 Noncompliance 02/26/2014  . Essential hypertension, benign 11/30/2012  . Acanthosis nigricans, acquired   . Hypoglycemia associated with diabetes ([place])   . Goiter   . Tachycardia   . Diabetic autonomic neuropathy ([place])   . Diabetic peripheral neuropathy ([place])   . Thyroiditis, autoimmune   . Asthma   . Environmental allergies    Past Medical History:  Diagnosis Date  . Acanthosis nigricans, acquired   . Asthma   . Diabetic autonomic neuropathy ([place])   . Diabetic peripheral neuropathy ([place])   . Environmental allergies   . Goiter   . Hypoglycemia associated with diabetes ([place])   . Tachycardia   . Thyroiditis, autoimmune   . Type 1 diabetes mellitus in patient age 33-19 years with HbA1C goal below 7.5     Family History  Problem Relation Age of Onset  . Diabetes Mother        Type II DM  . Thyroid disease Mother   . Diabetes Maternal Grandmother        Type II DM  . Diabetes Cousin        Type II DM  . Colon cancer Neg Hx   . Colon polyps Neg Hx     Past  Surgical History:  Procedure Laterality Date  . BIOPSY  08/27/2016   Procedure: BIOPSY;  Surgeon: Danie Binder, MD;  Location: [facility];  Service: Endoscopy;;  duodenum; gastric  . COLONOSCOPY    . ESOPHAGOGASTRODUODENOSCOPY N/A 08/27/2016   Dr. Oneida Alar: mild gastritis. Negative celiac. No obvious source for dyspepsia/diarrhea   Social History   Occupational History  . Not on file  Tobacco Use  . Smoking status: Never Smoker  . Smokeless tobacco: Never Used  Substance and Sexual Activity  . Alcohol use: No  . Drug use:  No  . Sexual activity: Never    Birth control/protection: Abstinence

## 2019-05-15 ENCOUNTER — Encounter (HOSPITAL_BASED_OUTPATIENT_CLINIC_OR_DEPARTMENT_OTHER): Payer: 59 | Admitting: Internal Medicine

## 2019-05-15 DIAGNOSIS — M86471 Chronic osteomyelitis with draining sinus, right ankle and foot: Secondary | ICD-10-CM | POA: Diagnosis not present

## 2019-05-15 DIAGNOSIS — E1042 Type 1 diabetes mellitus with diabetic polyneuropathy: Secondary | ICD-10-CM | POA: Insufficient documentation

## 2019-05-15 DIAGNOSIS — E10621 Type 1 diabetes mellitus with foot ulcer: Secondary | ICD-10-CM | POA: Insufficient documentation

## 2019-05-15 DIAGNOSIS — L97512 Non-pressure chronic ulcer of other part of right foot with fat layer exposed: Secondary | ICD-10-CM | POA: Insufficient documentation

## 2019-05-15 DIAGNOSIS — L97518 Non-pressure chronic ulcer of other part of right foot with other specified severity: Secondary | ICD-10-CM | POA: Diagnosis present

## 2019-05-15 DIAGNOSIS — I890 Lymphedema, not elsewhere classified: Secondary | ICD-10-CM | POA: Insufficient documentation

## 2019-05-15 NOTE — Progress Notes (Signed)
Lindsey Murillo, Lindsey Murillo ([id number]) Visit Report for 05/15/2019 HPI Details Patient Name: Date of Service: Lindsey Murillo, Lindsey Murillo 05/15/2019 3:45 PM Medical Record [id number] Patient Account Number: [id number] Date of Birth/Sex: Treating RN: [birth year] (24 y.o. F) Primary Care Provider: Baltazar Apo Other Clinician: Referring Provider: Treating Provider/Extender:Horacio Werth, Evlyn Kanner, [place] Dom in Treatment: 8 History of Present Illness HPI Description: 06/16/2018 ADMISSION This is a 24 year old type I diabetic patient. She was referred by her family physician Dr. Wolfgang Phoenix in [place] where they live. She had been seen in the urgent care at [facility] on 05/27/2018 with a large bulla on the right dorsal foot. An x-ray of the foot showed swelling of the base of her first toe and first MTP with no soft tissue gas or bony erosion. She came to the primary physician's attention in follow-up on 06/09/2018 noted to have a diabetic foot ulcer. She was advised to take work off. She has been using peroxide and Neosporin to the wound. Past medical history; the patient does not have a prior history of diabetic foot ulcers. She is listed as having both peripheral neuropathy and autonomic neuropathy. Chronic diarrhea thoracic back pain and anemia. ABIs in our clinic were noncompressible on the right 2/11; follow-up for this badly infected wound that we admitted to the clinic on 2/7. The area in question is on the right first metatarsal head. When she arrived in clinic last week there was swelling and erythema in the foot as well as on the lateral part of the first metatarsal head. Plain x-rays showed soft tissue wound with soft tissue gas noted overlying the first MTP no radiographic changes of osteomyelitis. The culture showed abundant group B strep. The Augmentin that I chose empirically last time clearly the right choice. She is offloading this in a healing sandal. She is not working. She  states her diabetes is under reasonably good control 2/14; continued follow-up. The area in question which is on the right first metatarsal head looks improved although still requiring debridement were using silver alginate. She is completing 10 days of Augmentin which she has tolerated well. According to the patient she is offloading this quite aggressively. She is still in a surgical shoe 2/21; continued follow-up. Right first metatarsal head generally improved but still requiring debridement there is less swelling and no erythema. She is offloading this in a surgical shoe. 2/28; right first metatarsal head generally improved but still requiring debridement we have been using Hydrofera Blue she is using a surgical shoe but I changed her to a Darco forefoot off loader today. Possible she will ultimately require a total contact cast 3/6 right first metatarsal head generally improved there is less adherent debris and generally washes off nicely with Anasept and gauze. Using Hydrofera Blue and a Darco. We are going to consider putting her in a total contact cast today however we did not have sufficient supplies 3/13; our intake nurse reported serosanguineous drainage which was concerning. The patient denies any systemic symptoms we have been using Hydrofera Blue in today with anticipation of putting on a total contact cast. However we also noted swelling around the wound. 3/20 -Right first metatarsal head wound appears granulating, without areas of necrosis or debris surrounding skin appears good, she is completed or almost completed her Keflex course. We will continue with the alginate and she is doing the offloading with the surgical shoe and felt. 3/27; right first metatarsal head. There is no evidence of infection currently. She has senescent looking wound edges and  some debris on the wound surface but in general things look quite a bit better today. We have been using alginate and offloading  her with a forefoot off loader. I am not going to consider total contact casting her in this environment until I can be certain with that the clinic will be open to deal with the obligatory first cast change in the routine changing 4/3; right first metatarsal head. No evidence of infection. The tissue looks healthy although there is some tunneling laterally towards the mid part of the foot. We are using silver alginate, I have changed that the silver collagen today. 4/9 right first metatarsal head. No evidence of infection. Using silver collagen. 4/17 right first metatarsal head the wound is actually larger today and I think deeper. There is no evidence of recurrent infection. 4/20; back for her obligatory first total contact cast change. The wound on her foot actually looks quite good on the right first metatarsal head however she clearly has a cast injury at the superior margin of the cast just at the folded level this was in spite of padding this area. 4/24; wound on the right first metatarsal head looks better. The cast injury wounds on the anterior tibial area also look better. 5/1 wound on the right first metatarsal head is closed over. The cost injury wounds on the anterior tibial are also closed. The patient has type 1 diabetes with polyneuropathy. I do not think she has the financial means for diabetic shoes with custom inserts READMISSION 03/20/2019 This is a patient with type 1 diabetes and polyneuropathy. She was seen earlier this year with a wound on the right first metatarsal head the closed over. We recommended that she offload this in her shoes. I do not believe that was done but in any case her wounds reopened sometime in October. She was seen in the ER on 10/20 with a diabetic foot ulcer. Right foot was swollen and erythematous. She was put on antibiotics although at the time of this dictation I am not exactly sure which one. An x-ray was done that showed no osteo-. She has  been soaking her foot in Epson salts and applying dry gauze. She is still working at [employer]. She has 4 open areas; one at the base of the second through fourth toes. One at the first met head 1 over the second met head and 1 over the midfoot. The second met head wound probes proximally towards her midfoot and midfoot wound probes superiorly although I could not connect these 2 wounds. There is no purulent drainage. A considerable amount of denuded skin over this area which I think is a remanent of cellulitis although the medial part of her foot is still erythematous there is no tenderness. For the most part I think this looks like treated cellulitis from her antibiotics in October. She has a superficial area of epithelial loss on the right lower mid tibia Past medical history; patient is a type I diabetic with polyneuropathy. She is not felt to have PAD.. She is unaware of her hemoglobin A1c she is supposed to see an endocrinologist this afternoon. 11/19; MRI is tomorrow. Culture I did last time showed group G strep. This would have been covered by the empiric Augmentin I gave her when I first saw her. She should have 3 more days of this I am going to extend this by another 4 days until we have time to digest the MRI. If she has osteomyelitis she is going to   need to see infectious disease for IV antibiotics. She has not been systemically unwell. She is tolerating the Augmentin well 12/1; the patient comes in today stating that her MRI that was supposed to be done on 11/20 was canceled by the hospital. She does not have an appointment. She has completed the Augmentin I gave her for the culture showing group G strep. Paradoxically she has less of an open area on the proximal foot just above her plantar metatarsals the midfoot also seems to be probing less and the left met head is closed. She also has a fairly extensive area on the right mid tibia in the setting of what looks to be lymphedema.  According to our records this was present on admission but I do not think I looked at this specifically the last time she was here. 12/8; I finally had to send this patient to the ER in order to get an MRI performed. The MRI showed findings consistent with osteomyelitis in the head and neck of the fifth metatarsal and the base and proximal phalanx of the fifth toe. There is also an abscess in the subcutaneous tissues dorsal to the fifth MTP and proximal phalanx of the little toe. Also noted that there is an abscess in the plantar soft tissues of the foot just proximal to the toes which extends from the great toe to the fourth toe. They did do an MRI of the tib-fib which was negative for underlying bone issues. Culture I did last week showed abundant Serratia and moderate group B strep They did not admit her last week from the ER which to me was surprising. She has purulent drainage coming out of the small wound at roughly the third met head. Erythema and swelling of the forefoot. She continues to have a large wound on the anterior mid tibia on the right 12/15; the patient still has the 2 small open areas over the second met head. Still draining purulent drainage although the entire foot looks less erythematous and less swollen. She still has the small area in the midfoot She has lymphedema in the upper leg the area on the mid tibia still requiring debridement but looks better. 12/22; single wound in the area over the second met head. Still with a purulent drainage although less so. The entire foot looks less erythematous and less swollen. But both are still present. The area on the midfoot is smaller. We have been using silver alginate to both of these areas -The area on the anterior tibial area looks healthier. We are using Iodoflex here under compression She still does not have an appointment with Dr. Sharol Given. This is in spite of the fact that it was suggested she get one when she left the ER and  that we have been working on this for 2 weeks. Will call his office tomorrow 12/29; the patient is seen Dr. Sharol Given tomorrow about the underlying osteomyelitis involving the fifth metatarsal and proximal phalanx of the fifth toe as well as the abscess in the plantar foot. On MRI. I have had her on a fairly prolonged course of cefdinir 300 twice daily. The abscess in the right plantar forefoot distal to the metatarsal heads. She still has 2 probing wounds in this area. At one point she had an area over the fifth metatarsal head that connected with an area on the midfoot. Both of these areas are closed. There is a lot less swelling and erythema in the forefoot. The area on the right anterior  mid tibia looks a lot better. We have been using Iodoflex on this area. 05/15/2019. Patient saw Dr. Sharol Given yesterday. The MRI. He did not think that the edema within the medial cuneiform was related to osteomyelitis rather more than a stress reaction. The edema at the MTP joint of the little toe appeared to be more consistent with chronic osteo per his note. He wanted a watchful waiting approach. Noted that she is not on doxycycline but she is on cefdinir. The patient's foot continues to look better. There is much less erythema and forefoot edema. At one point copious amounts of purulent drainage coming out of the small wound over her left third met head wound. Connections with the wound on the plantar foot were also noted. Electronic Signature(s) Signed: 05/15/2019 5:30:44 PM By: Linton Ham MD Entered By: Linton Ham on 05/15/2019 17:07:58 -------------------------------------------------------------------------------- Physical Exam Details Patient Name: Date of Service: KAO, BERKHEIMER 05/15/2019 3:45 PM Medical Record [id number] Patient Account Number: [id number] Date of Birth/Sex: Treating RN: 26-Dec-1995 (24 y.o. F) Primary Care Provider: Baltazar Apo Other Clinician: Referring Provider:  Treating Provider/Extender:Denajah Farias, Evlyn Kanner, [place] Dom in Treatment: 8 Constitutional Patient is hypertensive.. Pulse regular and within target range for patient.Marland Kitchen Respirations regular, non-labored and within target range.. Temperature is normal and within the target range for the patient.Marland Kitchen Appears in no distress. Cardiovascular Pedal pulses palpable and strong bilaterally.. Gastrointestinal (GI) Abdomen is soft and non-distended without masses or tenderness.. No liver or spleen enlargement. Notes Wound exam Continued improvement in the forefoot swelling and erythema. She still has the probing wound over her third MTP both laterally and medially right across the met heads. All of this looks better. Still an open area in the midfoot with some tunneling superiorly at 1 point this probed all the way into the met heads. Area on the right anterior tibial area secondary to lymphedema. This is really making excellent progress and is just about closed Electronic Signature(s) Signed: 05/15/2019 5:30:44 PM By: Linton Ham MD Entered By: Linton Ham on 05/15/2019 17:14:49 -------------------------------------------------------------------------------- Physician Orders Details Patient Name: Date of Service: Lindsey Murillo 05/15/2019 3:45 PM Medical Record [id number] Patient Account Number: [id number] Date of Birth/Sex: Treating RN: 07-Nov-1995 (24 y.o. Orvan Falconer Primary Care Provider: Baltazar Apo Other Clinician: Referring Provider: Treating Provider/Extender:Lenus Trauger, Evlyn Kanner, [place] Dom in Treatment: 8 Verbal / Phone Orders: No Diagnosis Coding ICD-10 Coding Code Description E10.621 Type 1 diabetes mellitus with foot ulcer M86.471 Chronic osteomyelitis with draining sinus, right ankle and foot E10.42 Type 1 diabetes mellitus with diabetic polyneuropathy L97.518 Non-pressure chronic ulcer of other part of right foot with other specified  severity L97.512 Non-pressure chronic ulcer of other part of right foot with fat layer exposed L97.811 Non-pressure chronic ulcer of other part of right lower leg limited to breakdown of skin I89.0 Lymphedema, not elsewhere classified Follow-up Appointments Return Appointment in 1 week. Dressing Change Frequency Do not change entire dressing for one week. Wound Cleansing Clean wound with Normal Saline. May shower and wash wound with soap and water. Primary Wound Dressing Wound #4 Right,Distal,Plantar Foot Calcium Alginate with Silver Wound #7 Right,Proximal,Plantar Foot Calcium Alginate with Silver Wound #8 Right,Anterior Lower Leg Iodoflex Secondary Dressing Dry Gauze ABD pad Drawtex Edema Control 3 Layer Compression System - Right Lower Extremity Avoid standing for long periods of time Elevate legs to the level of the heart or above for 30 minutes daily and/or when sitting, a frequency of: - 3 to 4 times a day throughout the day.  Off-Loading Open toe surgical shoe to: - on right foot. Patient Medications Allergies: No Known Allergies Notifications Medication Indication Start End cefdinir osteomyelitis 05/15/2019 right foot DOSE oral 300 mg capsule - 1 capsule oral bid for a further 7 days (continuing rx) Electronic Signature(s) Signed: 05/15/2019 5:13:34 PM By: Linton Ham MD Previous Signature: 05/15/2019 5:08:58 PM Version By: Carlene Coria RN Entered By: Linton Ham on 05/15/2019 17:13:32 -------------------------------------------------------------------------------- Problem List Details Patient Name: Date of Service: Lindsey Murillo. 05/15/2019 3:45 PM Medical Record [id number] Patient Account Number: [id number] Date of Birth/Sex: Treating RN: July 06, 1995 (24 y.o. Orvan Falconer Primary Care Provider: Baltazar Apo Other Clinician: Referring Provider: Treating Provider/Extender:Montee Tallman, Evlyn Kanner, [place] Dom in Treatment: 8 Active  Problems ICD-10 Evaluated Encounter Code Description Active Date Today Diagnosis E10.621 Type 1 diabetes mellitus with foot ulcer 03/20/2019 No Yes M86.471 Chronic osteomyelitis with draining sinus, right ankle 04/17/2019 No Yes and foot E10.42 Type 1 diabetes mellitus with diabetic polyneuropathy 03/20/2019 No Yes L97.518 Non-pressure chronic ulcer of other part of right foot 03/20/2019 No Yes with other specified severity L97.512 Non-pressure chronic ulcer of other part of right foot 03/20/2019 No Yes with fat layer exposed L97.811 Non-pressure chronic ulcer of other part of right lower 03/20/2019 No Yes leg limited to breakdown of skin I89.0 Lymphedema, not elsewhere classified 03/20/2019 No Yes Inactive Problems ICD-10 Code Description Active Date Inactive Date L03.115 Cellulitis of right lower limb 03/20/2019 03/20/2019 Resolved Problems Electronic Signature(s) Signed: 05/15/2019 5:30:44 PM By: Linton Ham MD Entered By: Linton Ham on 05/15/2019 17:06:13 -------------------------------------------------------------------------------- Progress Note Details Patient Name: Date of Service: Lindsey Murillo 05/15/2019 3:45 PM Medical Record [id number] Patient Account Number: [id number] Date of Birth/Sex: Treating RN: 1996/04/23 (24 y.o. F) Primary Care Provider: Baltazar Apo Other Clinician: Referring Provider: Treating Provider/Extender:Kirin Brandenburger, Evlyn Kanner, [place] Dom in Treatment: 8 Subjective History of Present Illness (HPI) 06/16/2018 ADMISSION This is a 24 year old type I diabetic patient. She was referred by her family physician Dr. Wolfgang Phoenix in [place] where they live. She had been seen in the urgent care at [facility] on 05/27/2018 with a large bulla on the right dorsal foot. An x-ray of the foot showed swelling of the base of her first toe and first MTP with no soft tissue gas or bony erosion. She came to the primary physician's attention in  follow-up on 06/09/2018 noted to have a diabetic foot ulcer. She was advised to take work off. She has been using peroxide and Neosporin to the wound. Past medical history; the patient does not have a prior history of diabetic foot ulcers. She is listed as having both peripheral neuropathy and autonomic neuropathy. Chronic diarrhea thoracic back pain and anemia. ABIs in our clinic were noncompressible on the right 2/11; follow-up for this badly infected wound that we admitted to the clinic on 2/7. The area in question is on the right first metatarsal head. When she arrived in clinic last week there was swelling and erythema in the foot as well as on the lateral part of the first metatarsal head. Plain x-rays showed soft tissue wound with soft tissue gas noted overlying the first MTP no radiographic changes of osteomyelitis. The culture showed abundant group B strep. The Augmentin that I chose empirically last time clearly the right choice. She is offloading this in a healing sandal. She is not working. She states her diabetes is under reasonably good control 2/14; continued follow-up. The area in question which is on the right first metatarsal head looks improved although  still requiring debridement were using silver alginate. She is completing 10 days of Augmentin which she has tolerated well. According to the patient she is offloading this quite aggressively. She is still in a surgical shoe 2/21; continued follow-up. Right first metatarsal head generally improved but still requiring debridement there is less swelling and no erythema. She is offloading this in a surgical shoe. 2/28; right first metatarsal head generally improved but still requiring debridement we have been using Hydrofera Blue she is using a surgical shoe but I changed her to a Darco forefoot off loader today. Possible she will ultimately require a total contact cast 3/6 right first metatarsal head generally improved there is less  adherent debris and generally washes off nicely with Anasept and gauze. Using Hydrofera Blue and a Darco. We are going to consider putting her in a total contact cast today however we did not have sufficient supplies 3/13; our intake nurse reported serosanguineous drainage which was concerning. The patient denies any systemic symptoms we have been using Hydrofera Blue in today with anticipation of putting on a total contact cast. However we also noted swelling around the wound. 3/20 -Right first metatarsal head wound appears granulating, without areas of necrosis or debris surrounding skin appears good, she is completed or almost completed her Keflex course. We will continue with the alginate and she is doing the offloading with the surgical shoe and felt. 3/27; right first metatarsal head. There is no evidence of infection currently. She has senescent looking wound edges and some debris on the wound surface but in general things look quite a bit better today. We have been using alginate and offloading her with a forefoot off loader. I am not going to consider total contact casting her in this environment until I can be certain with that the clinic will be open to deal with the obligatory first cast change in the routine changing 4/3; right first metatarsal head. No evidence of infection. The tissue looks healthy although there is some tunneling laterally towards the mid part of the foot. We are using silver alginate, I have changed that the silver collagen today. 4/9 right first metatarsal head. No evidence of infection. Using silver collagen. 4/17 right first metatarsal head the wound is actually larger today and I think deeper. There is no evidence of recurrent infection. 4/20; back for her obligatory first total contact cast change. The wound on her foot actually looks quite good on the right first metatarsal head however she clearly has a cast injury at the superior margin of the cast just  at the folded level this was in spite of padding this area. 4/24; wound on the right first metatarsal head looks better. The cast injury wounds on the anterior tibial area also look better. 5/1 wound on the right first metatarsal head is closed over. The cost injury wounds on the anterior tibial are also closed. The patient has type 1 diabetes with polyneuropathy. I do not think she has the financial means for diabetic shoes with custom inserts READMISSION 03/20/2019 This is a patient with type 1 diabetes and polyneuropathy. She was seen earlier this year with a wound on the right first metatarsal head the closed over. We recommended that she offload this in her shoes. I do not believe that was done but in any case her wounds reopened sometime in October. She was seen in the ER on 10/20 with a diabetic foot ulcer. Right foot was swollen and erythematous. She was put on antibiotics although  at the time of this dictation I am not exactly sure which one. An x-ray was done that showed no osteo-. She has been soaking her foot in Epson salts and applying dry gauze. She is still working at [employer]. She has 4 open areas; one at the base of the second through fourth toes. One at the first met head 1 over the second met head and 1 over the midfoot. The second met head wound probes proximally towards her midfoot and midfoot wound probes superiorly although I could not connect these 2 wounds. There is no purulent drainage. A considerable amount of denuded skin over this area which I think is a remanent of cellulitis although the medial part of her foot is still erythematous there is no tenderness. For the most part I think this looks like treated cellulitis from her antibiotics in October. She has a superficial area of epithelial loss on the right lower mid tibia Past medical history; patient is a type I diabetic with polyneuropathy. She is not felt to have PAD.Marland Kitchen She is unaware of her hemoglobin A1c she  is supposed to see an endocrinologist this afternoon. 11/19; MRI is tomorrow. Culture I did last time showed group G strep. This would have been covered by the empiric Augmentin I gave her when I first saw her. She should have 3 more days of this I am going to extend this by another 4 days until we have time to digest the MRI. If she has osteomyelitis she is going to need to see infectious disease for IV antibiotics. She has not been systemically unwell. She is tolerating the Augmentin well 12/1; the patient comes in today stating that her MRI that was supposed to be done on 11/20 was canceled by the hospital. She does not have an appointment. She has completed the Augmentin I gave her for the culture showing group G strep. Paradoxically she has less of an open area on the proximal foot just above her plantar metatarsals the midfoot also seems to be probing less and the left met head is closed. She also has a fairly extensive area on the right mid tibia in the setting of what looks to be lymphedema. According to our records this was present on admission but I do not think I looked at this specifically the last time she was here. 12/8; I finally had to send this patient to the ER in order to get an MRI performed. The MRI showed findings consistent with osteomyelitis in the head and neck of the fifth metatarsal and the base and proximal phalanx of the fifth toe. There is also an abscess in the subcutaneous tissues dorsal to the fifth MTP and proximal phalanx of the little toe. Also noted that there is an abscess in the plantar soft tissues of the foot just proximal to the toes which extends from the great toe to the fourth toe. They did do an MRI of the tib-fib which was negative for underlying bone issues. Culture I did last week showed abundant Serratia and moderate group B strep They did not admit her last week from the ER which to me was surprising. She has purulent drainage coming out of the  small wound at roughly the third met head. Erythema and swelling of the forefoot. She continues to have a large wound on the anterior mid tibia on the right 12/15; the patient still has the 2 small open areas over the second met head. Still draining purulent drainage although the entire  foot looks less erythematous and less swollen. She still has the small area in the midfoot She has lymphedema in the upper leg the area on the mid tibia still requiring debridement but looks better. 12/22; single wound in the area over the second met head. Still with a purulent drainage although less so. The entire foot looks less erythematous and less swollen. But both are still present. The area on the midfoot is smaller. We have been using silver alginate to both of these areas -The area on the anterior tibial area looks healthier. We are using Iodoflex here under compression She still does not have an appointment with Dr. Sharol Given. This is in spite of the fact that it was suggested she get one when she left the ER and that we have been working on this for 2 weeks. Will call his office tomorrow 12/29; the patient is seen Dr. Sharol Given tomorrow about the underlying osteomyelitis involving the fifth metatarsal and proximal phalanx of the fifth toe as well as the abscess in the plantar foot. On MRI. I have had her on a fairly prolonged course of cefdinir 300 twice daily. The abscess in the right plantar forefoot distal to the metatarsal heads. She still has 2 probing wounds in this area. At one point she had an area over the fifth metatarsal head that connected with an area on the midfoot. Both of these areas are closed. There is a lot less swelling and erythema in the forefoot. The area on the right anterior mid tibia looks a lot better. We have been using Iodoflex on this area. 05/15/2019. Patient saw Dr. Sharol Given yesterday. The MRI. He did not think that the edema within the medial cuneiform was related to osteomyelitis rather  more than a stress reaction. The edema at the MTP joint of the little toe appeared to be more consistent with chronic osteo per his note. He wanted a watchful waiting approach. Noted that she is not on doxycycline but she is on cefdinir. The patient's foot continues to look better. There is much less erythema and forefoot edema. At one point copious amounts of purulent drainage coming out of the small wound over her left third met head wound. Connections with the wound on the plantar foot were also noted. Objective Constitutional Patient is hypertensive.. Pulse regular and within target range for patient.Marland Kitchen Respirations regular, non-labored and within target range.. Temperature is normal and within the target range for the patient.Marland Kitchen Appears in no distress. Vitals Time Taken: 4:32 PM, Height: 68 in, Source: Stated, Weight: 137 lbs, Source: Stated, BMI: 20.8, Temperature: 97.5 F, Pulse: 113 bpm, Respiratory Rate: 18 breaths/min, Blood Pressure: 142/101 mmHg. Cardiovascular Pedal pulses palpable and strong bilaterally.. Gastrointestinal (GI) Abdomen is soft and non-distended without masses or tenderness.. No liver or spleen enlargement. General Notes: Wound exam ooContinued improvement in the forefoot swelling and erythema. She still has the probing wound over her third MTP both laterally and medially right across the met heads. All of this looks better. ooStill an open area in the midfoot with some tunneling superiorly at 1 point this probed all the way into the met heads. ooArea on the right anterior tibial area secondary to lymphedema. This is really making excellent progress and is just about closed Integumentary (Hair, Skin) Wound #4 status is Open. Original cause of wound was Gradually Appeared. The wound is located on the [place]. The wound measures 0.2cm length x 0.2cm width x 1cm depth; 0.031cm^2 area and 0.031cm^3 volume. There is Fat  Layer (Subcutaneous Tissue)  Exposed exposed. There is tunneling at 3:00 with a maximum distance of 2.7cm. There is a small amount of serosanguineous drainage noted. The wound margin is well defined and not attached to the wound base. There is small (1-33%) pink granulation within the wound bed. There is no necrotic tissue within the wound bed. Wound #7 status is Open. Original cause of wound was Gradually Appeared. The wound is located on the Right,Proximal,Plantar Foot. The wound measures 0.1cm length x 0.1cm width x 0.3cm depth; 0.008cm^2 area and 0.002cm^3 volume. There is Fat Layer (Subcutaneous Tissue) Exposed exposed. There is no tunneling or undermining noted. There is a none present amount of drainage noted. The wound margin is well defined and not attached to the wound base. There is no granulation within the wound bed. There is no necrotic tissue within the wound bed. Wound #8 status is Open. Original cause of wound was Blister. The wound is located on the Right,Anterior Lower Leg. The wound measures 0.8cm length x 0.7cm width x 0.1cm depth; 0.44cm^2 area and 0.044cm^3 volume. There is Fat Layer (Subcutaneous Tissue) Exposed exposed. There is no tunneling or undermining noted. There is a small amount of serosanguineous drainage noted. The wound margin is flat and intact. There is large (67-100%) red granulation within the wound bed. There is no necrotic tissue within the wound bed. 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 There was a Three Layer Compression Therapy Procedure by Carlene Coria, RN. Post procedure Diagnosis Wound #4: Same as Pre-Procedure Wound #7 Pre-procedure diagnosis of Wound #7 is a Diabetic Wound/Ulcer of the Lower Extremity located on the Right,Proximal,Plantar Foot . There was a Three Layer Compression Therapy Procedure by Carlene Coria, RN. Post procedure Diagnosis Wound #7: Same as Pre-Procedure Wound #8 Pre-procedure diagnosis of Wound #8 is a Diabetic Wound/Ulcer of the Lower Extremity located on the Right,Anterior Lower Leg . There was a Three Layer Compression Therapy Procedure by Carlene Coria, RN. Post procedure Diagnosis Wound #8: Same as Pre-Procedure Plan Follow-up Appointments: Return Appointment in 1 week. Dressing Change Frequency: Do not change entire dressing for one week. Wound Cleansing: Clean wound with Normal Saline. May shower and wash wound with soap and water. Primary Wound Dressing: Wound #4 Right,Distal,Plantar Foot: Calcium Alginate with Silver Wound #7 Right,Proximal,Plantar Foot: Calcium Alginate with Silver Wound #8 Right,Anterior Lower Leg: Iodoflex Secondary Dressing: Dry Gauze ABD pad Drawtex Edema Control: 3 Layer Compression System - Right Lower Extremity Avoid standing for long periods of time Elevate legs to the level of the heart or above for 30 minutes daily and/or when sitting, a frequency of: - 3 to 4 times a day throughout the day. Off-Loading: Open toe surgical shoe to: - on right foot. The following medication(s) was prescribed: cefdinir oral 300 mg capsule 1 capsule oral bid for a further 7 days (continuing rx) for osteomyelitis right foot starting 05/15/2019 1. Continue with Iodoflex to the area on the right anterior lower leg which was secondary to  lymphedema. The surface of this looks healthy but she continues to make progression with  this dressing. Her edema control is a lot better 2. Osteomyelitis and an abscess in the right forefoot. And the length of time it took to get investigations specifically the MRI is managed to get this looking a lot better. Going to give her another week of Ceftin her thigh will make 5 weeks and at that point I may back back off and see how the foot progresses. She has made considerable improvement. 3. She saw Dr. Sharol Given at this point I agree with a watchful waiting approach that he outlined 4. The patient has been stating today that she has had to soft to liquid bowel movements per day for the last 2 days. Some abdominal cramping. Her abdominal exam is completely benign. I think at this point I am going to continue with the antibiotics. I am reluctant to change course because of the dramatic improvement in the condition of her foot. The concern of course was pseudomembranous colitis Electronic Signature(s) Signed: 05/15/2019 5:30:44 PM By: Linton Ham MD Entered By: Linton Ham on 05/15/2019 17:15:23 -------------------------------------------------------------------------------- SuperBill Details Patient Name: Date of Service: Lindsey Murillo 05/15/2019 Medical Record [id number] Patient Account Number: [id number] Date of Birth/Sex: Treating RN: May 08, 1996 (24 y.o. Orvan Falconer Primary Care Provider: Baltazar Apo Other Clinician: Referring Provider: Treating Provider/Extender:Thayer Embleton, Evlyn Kanner, [place] Dom in Treatment: 8 Diagnosis Coding ICD-10 Codes Code Description E10.621 Type 1 diabetes mellitus with foot ulcer M86.471 Chronic osteomyelitis with draining sinus, right ankle and foot E10.42 Type 1 diabetes mellitus with diabetic polyneuropathy L97.518 Non-pressure chronic ulcer of other part of right foot with other specified severity L97.512 Non-pressure chronic ulcer of other part of right foot with fat layer exposed L97.811 Non-pressure chronic ulcer  of other part of right lower leg limited to breakdown of skin I89.0 Lymphedema, not elsewhere classified Facility Procedures CPT4 Code Description: [id number] (Facility Use Only) [phone] - APPLY MULTLAY COMPRS LWR RT LEG Modifier: Quantity: 1 Physician Procedures CPT4 Code Description: [id number] - WC PHYS LEVEL 4 - EST PT ICD-10 Diagnosis Description E10.621 Type 1 diabetes mellitus with foot ulcer M86.471 Chronic osteomyelitis with draining sinus, right ankle a L97.518 Non-pressure chronic ulcer of other  part of right foot w L97.811 Non-pressure chronic ulcer of other part of right lower skin Modifier: nd foot ith other specified leg limited to brea Quantity: 1 severity kdown of Electronic Signature(s) Signed: 05/15/2019 5:30:44 PM By: Linton Ham MD Previous Signature: 05/15/2019 5:08:58 PM Version By: Carlene Coria RN Entered By: Linton Ham on 05/15/2019 17:16:08

## 2019-05-15 NOTE — Progress Notes (Addendum)
Lindsey Murillo, Lindsey Murillo ([id number]) Visit Report for 05/15/2019 Arrival Information Details Patient Name: Date of Service: Lindsey Murillo, Lindsey Murillo 05/15/2019 3:45 PM Medical Record [id number] Patient Account Number: [id number] Date of Birth/Sex: Treating RN: 1995-11-07 (24 y.o. Elam Dutch Primary Care Nazaret Chea: Baltazar Apo Other Clinician: Referring Mansa Willers: Treating Graison Leinberger/Extender:Robson, Evlyn Kanner, [place] Dom in Treatment: 8 Visit Information History Since Last Visit Added or deleted any medications: No Patient Arrived: Ambulatory Any new allergies or adverse reactions: No Arrival Time: 16:31 Had a fall or experienced change in No Accompanied By: self activities of daily living that may affect Transfer Assistance: None risk of falls: Patient Requires Transmission-Based No Signs or symptoms of abuse/neglect since last No Precautions: visito Patient Has Alerts: No Hospitalized since last visit: No Implantable device outside of the clinic excluding No cellular tissue based products placed in the center since last visit: Has Dressing in Place as Prescribed: Yes Pain Present Now: No Electronic Signature(s) Signed: 05/15/2019 5:52:47 PM By: Baruch Gouty RN, BSN Entered By: Baruch Gouty on 05/15/2019 16:32:15 -------------------------------------------------------------------------------- Compression Therapy Details Patient Name: Date of Service: [name] 05/15/2019 3:45 PM Medical Record [id number] Patient Account Number: [id number] Date of Birth/Sex: Treating RN: 1995/09/04 (24 y.o. Orvan Falconer Primary Care Noelle Hoogland: Baltazar Apo Other Clinician: Referring Noha Karasik: Treating Lenyx Boody/Extender:Robson, Evlyn Kanner, [place] Dom in Treatment: 8 Compression Therapy Performed for Wound Wound #4 Right,Distal,Plantar Foot Assessment: Performed By: Jake Church, RN Compression Type: Three Layer Post Procedure  Diagnosis Same as Pre-procedure Electronic Signature(s) Signed: 05/15/2019 5:08:58 PM By: Carlene Coria RN Entered By: Carlene Coria on 05/15/2019 17:03:39 -------------------------------------------------------------------------------- Compression Therapy Details Patient Name: Date of Service: Lindsey Murillo, Lindsey Murillo 05/15/2019 3:45 PM Medical Record [id number] Patient Account Number: [id number] Date of Birth/Sex: Treating RN: [birth year] (24 y.o. Orvan Falconer Primary Care Heyden Jaber: Baltazar Apo Other Clinician: Referring Hillis Mcphatter: Treating Raeford Brandenburg/Extender:Robson, Evlyn Kanner, [place] Dom in Treatment: 8 Compression Therapy Performed for Wound Wound #7 Right,Proximal,Plantar Foot Assessment: Performed By: Jake Church, RN Compression Type: Three Layer Post Procedure Diagnosis Same as Pre-procedure Electronic Signature(s) Signed: 05/15/2019 5:08:58 PM By: Carlene Coria RN Entered By: Carlene Coria on 05/15/2019 17:03:39 -------------------------------------------------------------------------------- Compression Therapy Details Patient Name: Date of Service: Lindsey Murillo, Lindsey Murillo 05/15/2019 3:45 PM Medical Record [id number] Patient Account Number: [id number] Date of Birth/Sex: Treating RN: [birth year] (24 y.o. Orvan Falconer Primary Care Kalen Ratajczak: Baltazar Apo Other Clinician: Referring Aquiles Ruffini: Treating Halimah Bewick/Extender:Robson, Evlyn Kanner, [place] Dom in Treatment: 8 Compression Therapy Performed for Wound Wound #8 Right,Anterior Lower Leg Assessment: Performed By: Jake Church, RN Compression Type: Three Layer Post Procedure Diagnosis Same as Pre-procedure Electronic Signature(s) Signed: 05/15/2019 5:08:58 PM By: Carlene Coria RN Entered By: Carlene Coria on 05/15/2019 17:03:40 -------------------------------------------------------------------------------- Encounter Discharge Information Details Patient Name: Date of  Service: [name] 05/15/2019 3:45 PM Medical Record [id number] Patient Account Number: [id number] Date of Birth/Sex: Treating RN: [birth year] (24 y.o. Clearnce Sorrel Primary Care Lometa Riggin: Baltazar Apo Other Clinician: Referring Arjuna Doeden: Treating Karmela Bram/Extender:Robson, Evlyn Kanner, [place] Dom in Treatment: 8 Encounter Discharge Information Items Discharge Condition: Stable Ambulatory Status: Ambulatory Discharge Destination: Home Transportation: Private Auto Accompanied By: self Schedule Follow-up Appointment: Yes Clinical Summary of Care: Patient Declined Electronic Signature(s) Signed: 05/15/2019 5:45:44 PM By: Kela Millin Entered By: Kela Millin on 05/15/2019 17:32:07 -------------------------------------------------------------------------------- Lower Extremity Assessment Details Patient Name: Date of Service: Lindsey Murillo, Lindsey Murillo 05/15/2019 3:45 PM Medical Record [id number] Patient Account Number: [id number] Date of Birth/Sex: Treating RN: 06-20-1995 (24 y.o. Elam Dutch Primary Care Raymir Frommelt: Baltazar Apo Other  Clinician: Referring Arnold Depinto: Treating Jahaziel Francois/Extender:Robson, Evlyn Kanner, [place] Dom in Treatment: 8 Edema Assessment Assessed: [Left: No] [Right: No] Edema: [Left: Ye] [Right: s] Calf Left: Right: Point of Measurement: cm From Medial Instep cm 29.5 cm Ankle Left: Right: Point of Measurement: cm From Medial Instep cm 21.8 cm Vascular Assessment Pulses: Dorsalis Pedis Palpable: [Right:Yes] Electronic Signature(s) Signed: 05/15/2019 5:52:47 PM By: Baruch Gouty RN, BSN Entered By: Baruch Gouty on 05/15/2019 16:47:50 -------------------------------------------------------------------------------- Multi Wound Chart Details Patient Name: Date of Service: [name] 05/15/2019 3:45 PM Medical Record [id number] Patient Account Number: [id number] Date of  Birth/Sex: Treating RN: 1996-03-25 (24 y.o. 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] [7:Lower Extremity] [8:Lower Extremity] Secondary Etiology: [4:N/A] [7:N/A] [8:Lymphedema] Comorbid History: [4:Anemia, Type I Diabetes Anemia, Type I Diabetes Anemia, Type I Diabetes] Date Acquired: [4:01/09/2019] [7:01/09/2019] [8:03/18/2019] Weeks of Treatment: [4:8] [7:8] [8:8] Wound Status: [4:Open] [7:Open] [8:Open] Measurements L x W x D 0.2x0.2x1 [7:0.1x0.1x0.3] [8:0.8x0.7x0.1] (cm) Area (cm) : [4:0.031] [7:0.008] [8:0.44] Volume (cm) : [4:0.031] [7:0.002] [8:0.044] % Reduction in Area: [4:99.40%] [7:98.20%] [8:92.50%] % Reduction in Volume: 97.20% [7:99.40%] [8:92.50%] Position 1 (o'clock): 3 Maximum Distance 1 [4:2.7] (cm): Tunneling: [4:Yes] [7:No] [8:No] Classification: [4:Grade 3] [7:Grade 2] [8:Grade 1] Exudate Amount: [4:Small] [7:None Present] [8:Small] Exudate Type: [4:Serosanguineous] [7:N/A] [8:Serosanguineous] Exudate Color: [4:red, brown] [7:N/A] [8:red, brown] Wound Margin: [4:Well defined, not attached] [7:Well defined, not attached] [8:Flat and Intact] Granulation Amount: [4:Small (1-33%)] [7:None Present (0%)] [8:Large (67-100%)] Granulation Quality: [4:Pink] [7:N/A] [8:Red] Necrotic Amount: [4:None Present (0%)] [7:None Present  (0%)] [8:None Present (0%)] Exposed Structures: [4:Fat Layer (Subcutaneous Tissue) Exposed: Yes Fascia: No Tendon: No Muscle: No Joint: No Bone: No] [7:Fat Layer (Subcutaneous Tissue) Exposed: Yes Fascia: No Tendon: No Muscle: No Joint: No Bone: No] [8:Fat Layer (Subcutaneous Tissue) Exposed: Yes  Fascia: No Tendon: No Muscle: No Joint: No Bone: No] Epithelialization: [4:Small (1-33%) Compression Therapy] [7:Large (67-100%) Compression Therapy] [8:Small (1-33%) Compression Therapy] Treatment Notes Electronic Signature(s) Signed: 05/15/2019 5:30:44 PM By: Linton Ham MD Entered By: Linton Ham on 05/15/2019 17:06:24 -------------------------------------------------------------------------------- Multi-Disciplinary Care Plan Details Patient Name: Date of Service: [name] 05/15/2019 3:45 PM Medical Record [id number] Patient Account Number: [id number] Date of Birth/Sex: Treating RN: Sep 18, 1995 (24 y.o. Orvan Falconer Primary Care Erza Mothershead: Baltazar Apo Other Clinician: Referring Amere Iott: Treating Ulla Mckiernan/Extender:Robson, Evlyn Kanner, [place] Dom in Treatment: 8 Active Inactive Wound/Skin Impairment Nursing Diagnoses: Knowledge deficit related to ulceration/compromised skin integrity Goals: Patient/caregiver will verbalize understanding of skin care regimen Date Initiated: 03/20/2019 Target Resolution Date: 05/25/2019 Goal Status: Active Ulcer/skin breakdown will have a volume reduction of 30% by week 4 Target Resolution Date Initiated: 03/20/2019 Date Inactivated: 04/24/2019 Date: 04/20/2019 Unmet Goal Status: Unmet Reason: COMORBITIES/ NON COMPLAINCE Ulcer/skin breakdown will have a volume reduction of 50% by week 8 Date Initiated: 04/24/2019 Target Resolution Date: 05/25/2019 Goal Status: Active Interventions: Assess patient/caregiver ability to obtain necessary supplies Assess patient/caregiver ability to perform ulcer/skin care regimen upon  admission and as needed Assess ulceration(s) every visit Notes: Electronic Signature(s) Signed: 05/15/2019 5:08:58 PM By: Carlene Coria  RN Entered By: Carlene Coria on 05/15/2019 16:26:17 -------------------------------------------------------------------------------- Pain Assessment Details Patient Name: Date of Service: Lindsey Murillo, Lindsey Murillo 05/15/2019 3:45 PM Medical Record [id number] Patient Account Number: [id number] Date of Birth/Sex: Treating RN: January 20, 1996 (24 y.o. Elam Dutch Primary Care Verdella Laidlaw: Baltazar Apo Other Clinician: Referring Gresham Caetano: Treating Akilah Cureton/Extender:Robson, Evlyn Kanner, [place] Dom in Treatment: 8 Active Problems Location of Pain Severity and Description of Pain Patient Has Paino No Site Locations Rate the pain. Current Pain Level: 0 Pain Management and Medication Current Pain Management: Electronic Signature(s) Signed: 05/15/2019 5:52:47 PM By: Baruch Gouty RN, BSN Entered By: Baruch Gouty on 05/15/2019 16:34:40 -------------------------------------------------------------------------------- Patient/Caregiver Education Details Patient Name: Date of Service: [name] 1/5/2021andnbsp3:45 PM Medical Record [id number] Patient Account Number: [id number] Date of Birth/Gender: Treating RN: [birth year] (23 y.o. Orvan Falconer Primary Care Physician: Baltazar Apo Other Clinician: Referring Physician: Treating Physician/Extender:Robson, Evlyn Kanner, [place] Dom in Treatment: 8 Education Assessment Education Provided To: Patient Education Topics Provided Wound/Skin Impairment: Methods: Explain/Verbal Responses: State content correctly Electronic Signature(s) Signed: 05/15/2019 5:08:58 PM By: Carlene Coria RN Entered By: Carlene Coria on 05/15/2019 16:26:43 -------------------------------------------------------------------------------- Wound Assessment Details Patient Name: Date of  Service: Lindsey Murillo, Lindsey Murillo 05/15/2019 3:45 PM Medical Record [id number] Patient Account Number: [id number] Date of Birth/Sex: Treating RN: May 02, 1996 (24 y.o. Elam Dutch Primary Care Kennisha Qin: Baltazar Apo Other Clinician: Referring Daysy Santini: Treating Lusine Corlett/Extender:Robson, Evlyn Kanner, [place] Dom in Treatment: 8 Wound Status Wound Number: 4 Primary Diabetic Wound/Ulcer of the Lower Etiology: Extremity Wound Location: Right Foot - Plantar, Distal Wound Status: Open Wounding Event: Gradually Appeared Comorbid Anemia, Type I Diabetes Date Acquired: 01/09/2019 History: Weeks Of Treatment: 8 Clustered Wound: No Photos Wound Measurements Length: (cm) 0.2 Width: (cm) 0.2 Depth: (cm) 1 Area: (cm) 0.031 Volume: (cm) 0.031 % Reduction in Area: 99.4% % Reduction in Volume: 97.2% Epithelialization: Small (1-33%) Tunneling: Yes Position (o'clock): 3 Maximum Distance: (cm) 2.7 Wound Description Classification: Grade 3 Wound Margin: Well defined, not attached Exudate Amount: Small Exudate Type: Serosanguineous Exudate Color: red, brown Wound Bed Granulation Amount: Small (1-33%) Granulation Quality: Pink Necrotic Amount: None Present (0%) Foul Odor After Cleansing: No Slough/Fibrino No Exposed Structure Fascia Exposed: No Fat Layer (Subcutaneous Tissue) Exposed: Yes Tendon Exposed: No Muscle Exposed: No Joint Exposed: No Bone Exposed: No Treatment Notes Wound #4 (Right, Distal, Plantar Foot) 1. Cleanse With Wound Cleanser Soap and water 2. Periwound Care Moisturizing lotion 3. Primary Dressing Applied Calcium Alginate Ag 4. Secondary Dressing Dry Gauze 6. Support Layer Applied 3 layer compression wrap Electronic Signature(s) Signed: 05/18/2019 3:20:21 PM By: Mikeal Hawthorne EMT/HBOT Signed: 05/18/2019 3:29:16 PM By: Baruch Gouty RN, BSN Previous Signature: 05/15/2019 5:52:47 PM Version By: Baruch Gouty RN, BSN Entered By: Mikeal Hawthorne on 05/18/2019 14:37:07 -------------------------------------------------------------------------------- Wound Assessment Details Patient Name: Date of Service: Lindsey Murillo, Lindsey Murillo 05/15/2019 3:45 PM Medical Record [id number] Patient Account Number: [id number] Date of Birth/Sex: Treating RN: 22-Jan-1996 (24 y.o. Elam Dutch Primary Care Octa Uplinger: Baltazar Apo Other Clinician: Referring Malisa Ruggiero: Treating Raynesha Tiedt/Extender:Robson, Evlyn Kanner, [place] Dom in Treatment: 8 Wound Status Wound Number: 7 Primary Diabetic Wound/Ulcer of the Lower Etiology: Extremity Wound Location: Right Foot - Plantar, Proximal Wound Status: Open Wounding Event: Gradually Appeared Comorbid Anemia, Type I Diabetes Date Acquired: 01/09/2019 History: Weeks Of Treatment: 8 Clustered Wound: No Photos Wound Measurements Length: (cm) 0.1 Width: (cm) 0.1 Depth: (cm) 0.3 Area: (cm) 0.008 Volume: (cm) 0.002 Wound Description Classification: Grade 2 Wound Margin: Well defined, not attached Exudate Amount: None Present Wound Bed Granulation  Amount: None Present (0%) Necrotic Amount: None Present (0%) ter Cleansing: No no No Exposed Structure ed: No ubcutaneous Tissue) Exposed: Yes ed: No ed: No d: No : No % Reduction in Area: 98.2% % Reduction in Volume: 99.4% Epithelialization: Large (67-100%) Tunneling: No Undermining: No Foul Odor Af Slough/Fibri Fascia Expos Fat Layer (S Tendon Expos Muscle Expos Joint Expose Bone Exposed Treatment Notes Wound #7 (Right, Proximal, Plantar Foot) 1. Cleanse With Wound Cleanser Soap and water 2. Periwound Care Moisturizing lotion 3. Primary Dressing Applied Calcium Alginate Ag 4. Secondary Dressing Dry Gauze 6. Support Layer Applied 3 layer compression wrap Electronic Signature(s) Signed: 05/18/2019 3:20:21 PM By: Mikeal Hawthorne EMT/HBOT Signed: 05/18/2019 3:29:16 PM By: Baruch Gouty RN, BSN Previous Signature:  05/15/2019 5:52:47 PM Version By: Baruch Gouty RN, BSN Entered By: Mikeal Hawthorne on 05/18/2019 14:36:26 -------------------------------------------------------------------------------- Wound Assessment Details Patient Name: Date of Service: Lindsey Murillo, Lindsey Murillo 05/15/2019 3:45 PM Medical Record [id number] Patient Account Number: [id number] Date of Birth/Sex: Treating RN: 02-03-1996 (24 y.o. Elam Dutch Primary Care Brieanne Mignone: Baltazar Apo Other Clinician: Referring Fatmata Legere: Treating Maudell Stanbrough/Extender:Robson, Evlyn Kanner, [place] Dom in Treatment: 8 Wound Status Wound Number: 8 Primary Etiology: Diabetic Wound/Ulcer of the Lower Extremity Wound Location: Right Lower Leg - Anterior Secondary Lymphedema Wounding Event: Blister Etiology: Date Acquired: 03/18/2019 Wound Status: Open Weeks Of Treatment: 8 Comorbid Anemia, Type I Diabetes Clustered Wound: No History: Photos Wound Measurements Length: (cm) 0.8 Width: (cm) 0.7 Depth: (cm) 0.1 Area: (cm) 0.44 Volume: (cm) 0.044 Wound Description Classification: Grade 1 Wound Margin: Flat and Intact Exudate Amount: Small Exudate Type: Serosanguineous Exudate Color: red, brown Wound Bed Granulation Amount: Large (67-100%) Granulation Quality: Red Necrotic Amount: None Present (0%) Foul Odor After Cleansing: No Slough/Fibrino Yes Exposed Structure Fascia Exposed: No Fat Layer (Subcutaneous Tissue) Exposed: Ye Tendon Exposed: No Muscle Exposed: No Joint Exposed: No Bone Exposed: No % Reduction in Area: 92.5% % Reduction in Volume: 92.5% Epithelialization: Small (1-33%) Tunneling: No Undermining: No s Treatment Notes Wound #8 (Right, Anterior Lower Leg) 1. Cleanse With Wound Cleanser Soap and water 2. Periwound Care Moisturizing lotion 3. Primary Dressing Applied Iodoflex 4. Secondary Dressing Dry Gauze 6. Support Layer Applied 3 layer compression wrap Notes netting Electronic  Signature(s) Signed: 05/18/2019 3:20:21 PM By: Mikeal Hawthorne EMT/HBOT Signed: 05/18/2019 3:29:16 PM By: Baruch Gouty RN, BSN Previous Signature: 05/15/2019 5:52:47 PM Version By: Baruch Gouty RN, BSN Entered By: Mikeal Hawthorne on 05/18/2019 14:39:36 -------------------------------------------------------------------------------- [place] Details Patient Name: Date of Service: [name] 05/15/2019 3:45 PM Medical Record [id number] Patient Account Number: [id number] Date of Birth/Sex: Treating RN: [birth year] (24 y.o. Elam Dutch Primary Care Adrienna Karis: Baltazar Apo Other Clinician: Referring Deloma Spindle: Treating Kelsa Jaworowski/Extender:Robson, Evlyn Kanner, [place] Dom in Treatment: 8 Vital Signs Time Taken: 16:32 Temperature (F): 97.5 Height (in): 68 Pulse (bpm): 113 Source: Stated Respiratory Rate (breaths/min): 18 Weight (lbs): 137 Blood Pressure (mmHg): 142/101 Source: Stated Reference Range: 80 - 120 mg / dl Body Mass Index (BMI): 20.8 Electronic Signature(s) Signed: 05/15/2019 5:52:47 PM By: Baruch Gouty RN, BSN Entered By: Baruch Gouty on 05/15/2019 16:34:58

## 2019-05-22 ENCOUNTER — Other Ambulatory Visit: Payer: Self-pay

## 2019-05-22 ENCOUNTER — Encounter (HOSPITAL_BASED_OUTPATIENT_CLINIC_OR_DEPARTMENT_OTHER): Payer: 59 | Admitting: Internal Medicine

## 2019-05-22 DIAGNOSIS — E10621 Type 1 diabetes mellitus with foot ulcer: Secondary | ICD-10-CM | POA: Diagnosis not present

## 2019-05-22 NOTE — Progress Notes (Signed)
Muccio, Rashad R. ([id number]) Visit Report for 05/22/2019 HPI Details Patient Name: Date of Service: Jauregui, Courney R. 05/22/2019 3:45 PM Medical Record Number:[id number] Patient Account Number: [id number] Date of Birth/Sex: Treating RN: 02/12/1996 (23 y.o. F) Primary Care Provider: LUKING, WILLIAM Other Clinician: Referring Provider: Treating Provider/Extender:Robson, Michael LUKING, WILLIAM Weeks in Treatment: 9 History of Present Illness HPI Description: 06/16/2018 ADMISSION This is a 22-year-old type I diabetic patient. She was referred by her family physician Dr. Luking in [place] where they live. She had been seen in the urgent care at [facility] on 05/27/2018 with a large bulla on the right dorsal foot. An x-ray of the foot showed swelling of the base of her first toe and first MTP with no soft tissue gas or bony erosion. She came to the primary physician's attention in follow-up on 06/09/2018 noted to have a diabetic foot ulcer. She was advised to take work off. She has been using peroxide and Neosporin to the wound. Past medical history; the patient does not have a prior history of diabetic foot ulcers. She is listed as having both peripheral neuropathy and autonomic neuropathy. Chronic diarrhea thoracic back pain and anemia. ABIs in our clinic were noncompressible on the right 2/11; follow-up for this badly infected wound that we admitted to the clinic on 2/7. The area in question is on the right first metatarsal head. When she arrived in clinic last week there was swelling and erythema in the foot as well as on the lateral part of the first metatarsal head. Plain x-rays showed soft tissue wound with soft tissue gas noted overlying the first MTP no radiographic changes of osteomyelitis. The culture showed abundant group B strep. The Augmentin that I chose empirically last time clearly the right choice. She is offloading this in a healing sandal. She is not working. She  states her diabetes is under reasonably good control 2/14; continued follow-up. The area in question which is on the right first metatarsal head looks improved although still requiring debridement were using silver alginate. She is completing 10 days of Augmentin which she has tolerated well. According to the patient she is offloading this quite aggressively. She is still in a surgical shoe 2/21; continued follow-up. Right first metatarsal head generally improved but still requiring debridement there is less swelling and no erythema. She is offloading this in a surgical shoe. 2/28; right first metatarsal head generally improved but still requiring debridement we have been using Hydrofera Blue she is using a surgical shoe but I changed her to a Darco forefoot off loader today. Possible she will ultimately require a total contact cast 3/6 right first metatarsal head generally improved there is less adherent debris and generally washes off nicely with Anasept and gauze. Using Hydrofera Blue and a Darco. We are going to consider putting her in a total contact cast today however we did not have sufficient supplies 3/13; our intake nurse reported serosanguineous drainage which was concerning. The patient denies any systemic symptoms we have been using Hydrofera Blue in today with anticipation of putting on a total contact cast. However we also noted swelling around the wound. 3/20 -Right first metatarsal head wound appears granulating, without areas of necrosis or debris surrounding skin appears good, she is completed or almost completed her Keflex course. We will continue with the alginate and she is doing the offloading with the surgical shoe and felt. 3/27; right first metatarsal head. There is no evidence of infection currently. She has senescent looking wound edges and   some debris on the wound surface but in general things look quite a bit better today. We have been using alginate and offloading  her with a forefoot off loader. I am not going to consider total contact casting her in this environment until I can be certain with that the clinic will be open to deal with the obligatory first cast change in the routine changing 4/3; right first metatarsal head. No evidence of infection. The tissue looks healthy although there is some tunneling laterally towards the mid part of the foot. We are using silver alginate, I have changed that the silver collagen today. 4/9 right first metatarsal head. No evidence of infection. Using silver collagen. 4/17 right first metatarsal head the wound is actually larger today and I think deeper. There is no evidence of recurrent infection. 4/20; back for her obligatory first total contact cast change. The wound on her foot actually looks quite good on the right first metatarsal head however she clearly has a cast injury at the superior margin of the cast just at the folded level this was in spite of padding this area. 4/24; wound on the right first metatarsal head looks better. The cast injury wounds on the anterior tibial area also look better. 5/1 wound on the right first metatarsal head is closed over. The cost injury wounds on the anterior tibial are also closed. The patient has type 1 diabetes with polyneuropathy. I do not think she has the financial means for diabetic shoes with custom inserts READMISSION 03/20/2019 This is a patient with type 1 diabetes and polyneuropathy. She was seen earlier this year with a wound on the right first metatarsal head the closed over. We recommended that she offload this in her shoes. I do not believe that was done but in any case her wounds reopened sometime in October. She was seen in the ER on 10/20 with a diabetic foot ulcer. Right foot was swollen and erythematous. She was put on antibiotics although at the time of this dictation I am not exactly sure which one. An x-ray was done that showed no osteo-. She has  been soaking her foot in Epson salts and applying dry gauze. She is still working at [employer]. She has 4 open areas; one at the base of the second through fourth toes. One at the first met head 1 over the second met head and 1 over the midfoot. The second met head wound probes proximally towards her midfoot and midfoot wound probes superiorly although I could not connect these 2 wounds. There is no purulent drainage. A considerable amount of denuded skin over this area which I think is a remanent of cellulitis although the medial part of her foot is still erythematous there is no tenderness. For the most part I think this looks like treated cellulitis from her antibiotics in October. She has a superficial area of epithelial loss on the right lower mid tibia Past medical history; patient is a type I diabetic with polyneuropathy. She is not felt to have PAD.. She is unaware of her hemoglobin A1c she is supposed to see an endocrinologist this afternoon. 11/19; MRI is tomorrow. Culture I did last time showed group G strep. This would have been covered by the empiric Augmentin I gave her when I first saw her. She should have 3 more days of this I am going to extend this by another 4 days until we have time to digest the MRI. If she has osteomyelitis she is going to   need to see infectious disease for IV antibiotics. She has not been systemically unwell. She is tolerating the Augmentin well 12/1; the patient comes in today stating that her MRI that was supposed to be done on 11/20 was canceled by the hospital. She does not have an appointment. She has completed the Augmentin I gave her for the culture showing group G strep. Paradoxically she has less of an open area on the proximal foot just above her plantar metatarsals the midfoot also seems to be probing less and the left met head is closed. She also has a fairly extensive area on the right mid tibia in the setting of what looks to be lymphedema.  According to our records this was present on admission but I do not think I looked at this specifically the last time she was here. 12/8; I finally had to send this patient to the ER in order to get an MRI performed. The MRI showed findings consistent with osteomyelitis in the head and neck of the fifth metatarsal and the base and proximal phalanx of the fifth toe. There is also an abscess in the subcutaneous tissues dorsal to the fifth MTP and proximal phalanx of the little toe. Also noted that there is an abscess in the plantar soft tissues of the foot just proximal to the toes which extends from the great toe to the fourth toe. They did do an MRI of the tib-fib which was negative for underlying bone issues. Culture I did last week showed abundant Serratia and moderate group B strep They did not admit her last week from the ER which to me was surprising. She has purulent drainage coming out of the small wound at roughly the third met head. Erythema and swelling of the forefoot. She continues to have a large wound on the anterior mid tibia on the right 12/15; the patient still has the 2 small open areas over the second met head. Still draining purulent drainage although the entire foot looks less erythematous and less swollen. She still has the small area in the midfoot She has lymphedema in the upper leg the area on the mid tibia still requiring debridement but looks better. 12/22; single wound in the area over the second met head. Still with a purulent drainage although less so. The entire foot looks less erythematous and less swollen. But both are still present. The area on the midfoot is smaller. We have been using silver alginate to both of these areas -The area on the anterior tibial area looks healthier. We are using Iodoflex here under compression She still does not have an appointment with Dr. Sharol Given. This is in spite of the fact that it was suggested she get one when she left the ER and  that we have been working on this for 2 weeks. Will call his office tomorrow 12/29; the patient is seen Dr. Sharol Given tomorrow about the underlying osteomyelitis involving the fifth metatarsal and proximal phalanx of the fifth toe as well as the abscess in the plantar foot. On MRI. I have had her on a fairly prolonged course of cefdinir 300 twice daily. The abscess in the right plantar forefoot distal to the metatarsal heads. She still has 2 probing wounds in this area. At one point she had an area over the fifth metatarsal head that connected with an area on the midfoot. Both of these areas are closed. There is a lot less swelling and erythema in the forefoot. The area on the right anterior  mid tibia looks a lot better. We have been using Iodoflex on this area. 05/15/2019. Patient saw Dr. Duda yesterday. The MRI. He did not think that the edema within the medial cuneiform was related to osteomyelitis rather more than a stress reaction. The edema at the MTP joint of the little toe appeared to be more consistent with chronic osteo per his note. He wanted a watchful waiting approach. Noted that she is not on doxycycline but she is on cefdinir. The patient's foot continues to look better. There is much less erythema and forefoot edema. At one point copious amounts of purulent drainage coming out of the small wound over her left third met head wound. Connections with the wound on the plantar foot were also noted. 1/12; the patient's wound on the right anterior lower leg which was in the setting of lymphedema is closed. She still has the draining areas just distal to her metatarsal heads. 1 of these looks as though they are about to close over. The larger one which is a tiny opening still probes towards her first toe. Wound in the middle of her foot is healed. With regards to the osteomyelitis and abscess I have had her on a 5-week course of cefdinir. She will complete this in 2 days I think antibiotics can  stop at that point. She gave a concerning history of diarrhea last time although the history seems a lot less worrisome today to semiformed bowel movements a day Electronic Signature(s) Signed: 05/22/2019 6:16:53 PM By: Robson, Michael MD Entered By: Robson, Michael on 05/22/2019 17:30:49 -------------------------------------------------------------------------------- Physical Exam Details Patient Name: Date of Service: Hamman, Dayne R. 05/22/2019 3:45 PM Medical Record Number:[id number] Patient Account Number: [id number] Date of Birth/Sex: Treating RN: 11/04/1995 (23 y.o. F) Primary Care Provider: LUKING, WILLIAM Other Clinician: Referring Provider: Treating Provider/Extender:Robson, Michael LUKING, WILLIAM Weeks in Treatment: 9 Constitutional Patient is hypertensive.. Pulse regular and within target range for patient.. Respirations regular, non-labored and within target range.. Temperature is normal and within the target range for the patient.. Appears in no distress. Cardiovascular Pedal pulses are palpable. Good edema control in the right leg. Lymphedema nonpitting edema below the knee on the left there is no signs of systemic fluid volume overload. Musculoskeletal The forefoot swelling and erythema is resolved. No tenderness over the metatarsal heads. Integumentary (Hair, Skin) No erythema around the wound. Notes Exam She has a resolution of the forefoot swelling and erythema. She still has the probing wound over the third MTP but this only goes medially today. The small wound laterally to this appears to be shutting down. The area on the right anterior mid tibia is closed Electronic Signature(s) Signed: 05/22/2019 6:16:53 PM By: Robson, Michael MD Entered By: Robson, Michael on 05/22/2019 17:32:36 -------------------------------------------------------------------------------- Physician Orders Details Patient Name: Date of Service: Rampersaud, Trena R. 05/22/2019 3:45  PM Medical Record Number:[id number] Patient Account Number: [id number] Date of Birth/Sex: Treating RN: 09/26/1995 (23 y.o. F) Epps, Carrie Primary Care Provider: LUKING, WILLIAM Other Clinician: Referring Provider: Treating Provider/Extender:Robson, Michael LUKING, WILLIAM Weeks in Treatment: 9 Verbal / Phone Orders: No Diagnosis Coding ICD-10 Coding Code Description E10.621 Type 1 diabetes mellitus with foot ulcer M86.471 Chronic osteomyelitis with draining sinus, right ankle and foot E10.42 Type 1 diabetes mellitus with diabetic polyneuropathy L97.518 Non-pressure chronic ulcer of other part of right foot with other specified severity L97.512 Non-pressure chronic ulcer of other part of right foot with fat layer exposed L97.811 Non-pressure chronic ulcer of other part of right lower leg limited to   breakdown of skin I89.0 Lymphedema, not elsewhere classified Follow-up Appointments Return Appointment in 1 week. Dressing Change Frequency Do not change entire dressing for one week. Wound Cleansing Clean wound with Normal Saline. May shower and wash wound with soap and water. Primary Wound Dressing Wound #4 Right,Distal,Plantar Foot Calcium Alginate with Silver Wound #7 Right,Proximal,Plantar Foot Calcium Alginate with Silver Wound #8 Right,Anterior Lower Leg Iodoflex Secondary Dressing Dry Gauze ABD pad Drawtex Edema Control 3 Layer Compression System - Right Lower Extremity Avoid standing for long periods of time Elevate legs to the level of the heart or above for 30 minutes daily and/or when sitting, a frequency of: - 3 to 4 times a day throughout the day. Off-Loading Open toe surgical shoe to: - on right foot. Electronic Signature(s) Signed: 05/22/2019 6:16:53 PM By: Robson, Michael MD Signed: 05/22/2019 6:18:05 PM By: Epps, Carrie RN Entered By: Epps, Carrie on 05/22/2019 16:18:27 -------------------------------------------------------------------------------- Problem  List Details Patient Name: Date of Service: Salguero, Eloisa R. 05/22/2019 3:45 PM Medical Record Number:[id number] Patient Account Number: [id number] Date of Birth/Sex: Treating RN: 02/08/1996 (23 y.o. F) Epps, Carrie Primary Care Provider: LUKING, WILLIAM Other Clinician: Referring Provider: Treating Provider/Extender:Robson, Michael LUKING, WILLIAM Weeks in Treatment: 9 Active Problems ICD-10 Evaluated Encounter Code Description Active Date Today Diagnosis E10.621 Type 1 diabetes mellitus with foot ulcer 03/20/2019 No Yes M86.471 Chronic osteomyelitis with draining sinus, right ankle 04/17/2019 No Yes and foot E10.42 Type 1 diabetes mellitus with diabetic polyneuropathy 03/20/2019 No Yes L97.518 Non-pressure chronic ulcer of other part of right foot 03/20/2019 No Yes with other specified severity L97.512 Non-pressure chronic ulcer of other part of right foot 03/20/2019 No Yes with fat layer exposed I89.0 Lymphedema, not elsewhere classified 03/20/2019 No Yes Inactive Problems ICD-10 Code Description Active Date Inactive Date L03.115 Cellulitis of right lower limb 03/20/2019 03/20/2019 L97.811 Non-pressure chronic ulcer of other part of right lower leg 03/20/2019 03/20/2019 limited to breakdown of skin Resolved Problems Electronic Signature(s) Signed: 05/22/2019 6:16:53 PM By: Robson, Michael MD Entered By: Robson, Michael on 05/22/2019 17:28:29 -------------------------------------------------------------------------------- Progress Note Details Patient Name: Date of Service: Plasencia, Lakeyshia R. 05/22/2019 3:45 PM Medical Record Number:[id number] Patient Account Number: [id number] Date of Birth/Sex: Treating RN: 07/20/1995 (23 y.o. F) Primary Care Provider: LUKING, WILLIAM Other Clinician: Referring Provider: Treating Provider/Extender:Robson, Michael LUKING, WILLIAM Weeks in Treatment: 9 Subjective History of Present Illness (HPI) 06/16/2018 ADMISSION This is a  22-year-old type I diabetic patient. She was referred by her family physician Dr. Luking in [place] where they live. She had been seen in the urgent care at [facility] on 05/27/2018 with a large bulla on the right dorsal foot. An x-ray of the foot showed swelling of the base of her first toe and first MTP with no soft tissue gas or bony erosion. She came to the primary physician's attention in follow-up on 06/09/2018 noted to have a diabetic foot ulcer. She was advised to take work off. She has been using peroxide and Neosporin to the wound. Past medical history; the patient does not have a prior history of diabetic foot ulcers. She is listed as having both peripheral neuropathy and autonomic neuropathy. Chronic diarrhea thoracic back pain and anemia. ABIs in our clinic were noncompressible on the right 2/11; follow-up for this badly infected wound that we admitted to the clinic on 2/7. The area in question is on the right first metatarsal head. When she arrived in clinic last week there was swelling and erythema in the foot as well as on the   lateral part of the first metatarsal head. Plain x-rays showed soft tissue wound with soft tissue gas noted overlying the first MTP no radiographic changes of osteomyelitis. The culture showed abundant group B strep. The Augmentin that I chose empirically last time clearly the right choice. She is offloading this in a healing sandal. She is not working. She states her diabetes is under reasonably good control 2/14; continued follow-up. The area in question which is on the right first metatarsal head looks improved although still requiring debridement were using silver alginate. She is completing 10 days of Augmentin which she has tolerated well. According to the patient she is offloading this quite aggressively. She is still in a surgical shoe 2/21; continued follow-up. Right first metatarsal head generally improved but still requiring debridement there is  less swelling and no erythema. She is offloading this in a surgical shoe. 2/28; right first metatarsal head generally improved but still requiring debridement we have been using Hydrofera Blue she is using a surgical shoe but I changed her to a Darco forefoot off loader today. Possible she will ultimately require a total contact cast 3/6 right first metatarsal head generally improved there is less adherent debris and generally washes off nicely with Anasept and gauze. Using Hydrofera Blue and a Darco. We are going to consider putting her in a total contact cast today however we did not have sufficient supplies 3/13; our intake nurse reported serosanguineous drainage which was concerning. The patient denies any systemic symptoms we have been using Hydrofera Blue in today with anticipation of putting on a total contact cast. However we also noted swelling around the wound. 3/20 -Right first metatarsal head wound appears granulating, without areas of necrosis or debris surrounding skin appears good, she is completed or almost completed her Keflex course. We will continue with the alginate and she is doing the offloading with the surgical shoe and felt. 3/27; right first metatarsal head. There is no evidence of infection currently. She has senescent looking wound edges and some debris on the wound surface but in general things look quite a bit better today. We have been using alginate and offloading her with a forefoot off loader. I am not going to consider total contact casting her in this environment until I can be certain with that the clinic will be open to deal with the obligatory first cast change in the routine changing 4/3; right first metatarsal head. No evidence of infection. The tissue looks healthy although there is some tunneling laterally towards the mid part of the foot. We are using silver alginate, I have changed that the silver collagen today. 4/9 right first metatarsal head. No  evidence of infection. Using silver collagen. 4/17 right first metatarsal head the wound is actually larger today and I think deeper. There is no evidence of recurrent infection. 4/20; back for her obligatory first total contact cast change. The wound on her foot actually looks quite good on the right first metatarsal head however she clearly has a cast injury at the superior margin of the cast just at the folded level this was in spite of padding this area. 4/24; wound on the right first metatarsal head looks better. The cast injury wounds on the anterior tibial area also look better. 5/1 wound on the right first metatarsal head is closed over. The cost injury wounds on the anterior tibial are also closed. The patient has type 1 diabetes with polyneuropathy. I do not think she has the financial means for diabetic   shoes with custom inserts READMISSION 03/20/2019 This is a patient with type 1 diabetes and polyneuropathy. She was seen earlier this year with a wound on the right first metatarsal head the closed over. We recommended that she offload this in her shoes. I do not believe that was done but in any case her wounds reopened sometime in October. She was seen in the ER on 10/20 with a diabetic foot ulcer. Right foot was swollen and erythematous. She was put on antibiotics although at the time of this dictation I am not exactly sure which one. An x-ray was done that showed no osteo-. She has been soaking her foot in Epson salts and applying dry gauze. She is still working at [employer]. She has 4 open areas; one at the base of the second through fourth toes. One at the first met head 1 over the second met head and 1 over the midfoot. The second met head wound probes proximally towards her midfoot and midfoot wound probes superiorly although I could not connect these 2 wounds. There is no purulent drainage. A considerable amount of denuded skin over this area which I think is a remanent of  cellulitis although the medial part of her foot is still erythematous there is no tenderness. For the most part I think this looks like treated cellulitis from her antibiotics in October. She has a superficial area of epithelial loss on the right lower mid tibia Past medical history; patient is a type I diabetic with polyneuropathy. She is not felt to have PAD.Marland Kitchen She is unaware of her hemoglobin A1c she is supposed to see an endocrinologist this afternoon. 11/19; MRI is tomorrow. Culture I did last time showed group G strep. This would have been covered by the empiric Augmentin I gave her when I first saw her. She should have 3 more days of this I am going to extend this by another 4 days until we have time to digest the MRI. If she has osteomyelitis she is going to need to see infectious disease for IV antibiotics. She has not been systemically unwell. She is tolerating the Augmentin well 12/1; the patient comes in today stating that her MRI that was supposed to be done on 11/20 was canceled by the hospital. She does not have an appointment. She has completed the Augmentin I gave her for the culture showing group G strep. Paradoxically she has less of an open area on the proximal foot just above her plantar metatarsals the midfoot also seems to be probing less and the left met head is closed. She also has a fairly extensive area on the right mid tibia in the setting of what looks to be lymphedema. According to our records this was present on admission but I do not think I looked at this specifically the last time she was here. 12/8; I finally had to send this patient to the ER in order to get an MRI performed. The MRI showed findings consistent with osteomyelitis in the head and neck of the fifth metatarsal and the base and proximal phalanx of the fifth toe. There is also an abscess in the subcutaneous tissues dorsal to the fifth MTP and proximal phalanx of the little toe. Also noted that there is  an abscess in the plantar soft tissues of the foot just proximal to the toes which extends from the great toe to the fourth toe. They did do an MRI of the tib-fib which was negative for underlying bone issues. Culture I  did last week showed abundant Serratia and moderate group B strep They did not admit her last week from the ER which to me was surprising. She has purulent drainage coming out of the small wound at roughly the third met head. Erythema and swelling of the forefoot. She continues to have a large wound on the anterior mid tibia on the right 12/15; the patient still has the 2 small open areas over the second met head. Still draining purulent drainage although the entire foot looks less erythematous and less swollen. She still has the small area in the midfoot She has lymphedema in the upper leg the area on the mid tibia still requiring debridement but looks better. 12/22; single wound in the area over the second met head. Still with a purulent drainage although less so. The entire foot looks less erythematous and less swollen. But both are still present. The area on the midfoot is smaller. We have been using silver alginate to both of these areas -The area on the anterior tibial area looks healthier. We are using Iodoflex here under compression She still does not have an appointment with Dr. Duda. This is in spite of the fact that it was suggested she get one when she left the ER and that we have been working on this for 2 weeks. Will call his office tomorrow 12/29; the patient is seen Dr. Duda tomorrow about the underlying osteomyelitis involving the fifth metatarsal and proximal phalanx of the fifth toe as well as the abscess in the plantar foot. On MRI. I have had her on a fairly prolonged course of cefdinir 300 twice daily. The abscess in the right plantar forefoot distal to the metatarsal heads. She still has 2 probing wounds in this area. At one point she had an area over the  fifth metatarsal head that connected with an area on the midfoot. Both of these areas are closed. There is a lot less swelling and erythema in the forefoot. The area on the right anterior mid tibia looks a lot better. We have been using Iodoflex on this area. 05/15/2019. Patient saw Dr. Duda yesterday. The MRI. He did not think that the edema within the medial cuneiform was related to osteomyelitis rather more than a stress reaction. The edema at the MTP joint of the little toe appeared to be more consistent with chronic osteo per his note. He wanted a watchful waiting approach. Noted that she is not on doxycycline but she is on cefdinir. The patient's foot continues to look better. There is much less erythema and forefoot edema. At one point copious amounts of purulent drainage coming out of the small wound over her left third met head wound. Connections with the wound on the plantar foot were also noted. 1/12; the patient's wound on the right anterior lower leg which was in the setting of lymphedema is closed. She still has the draining areas just distal to her metatarsal heads. 1 of these looks as though they are about to close over. The larger one which is a tiny opening still probes towards her first toe. Wound in the middle of her foot is healed. With regards to the osteomyelitis and abscess I have had her on a 5-week course of cefdinir. She will complete this in 2 days I think antibiotics can stop at that point. She gave a concerning history of diarrhea last time although the history seems a lot less worrisome today to semiformed bowel movements a day Objective Constitutional Patient is   hypertensive.. Pulse regular and within target range for patient.Marland Kitchen Respirations regular, non-labored and within target range.. Temperature is normal and within the target range for the patient.Marland Kitchen Appears in no distress. Vitals Time Taken: 4:40 PM, Height: 68 in, Weight: 137 lbs, BMI: 20.8, Temperature: 98  F, Pulse: 124 bpm, Respiratory Rate: 18 breaths/min, Blood Pressure: 144/99 mmHg. Cardiovascular Pedal pulses are palpable. Good edema control in the right leg. Lymphedema nonpitting edema below the knee on the left there is no signs of systemic fluid volume overload. Musculoskeletal The forefoot swelling and erythema is resolved. No tenderness over the metatarsal heads. General Notes: Exam ooShe has a resolution of the forefoot swelling and erythema. She still has the probing wound over the third MTP but this only goes medially today. The small wound laterally to this appears to be shutting down. ooThe area on the right anterior mid tibia is closed Integumentary (Hair, Skin) No erythema around the wound. Wound #4 status is Open. Original cause of wound was Gradually Appeared. The wound is located on the [place]. The wound measures 0.2cm length x 0.2cm width x 1cm depth; 0.031cm^2 area and 0.031cm^3 volume. There is Fat Layer (Subcutaneous Tissue) Exposed exposed. There is no undermining noted, however, there is tunneling at 3:00 with a maximum distance of 2.9cm. There is a small amount of serous drainage noted. The wound margin is well defined and not attached to the wound base. There is medium (34-66%) pink granulation within the wound bed. There is no necrotic tissue within the wound bed. Wound #7 status is Open. Original cause of wound was Gradually Appeared. The wound is located on the Right,Proximal,Plantar Foot. The wound measures 0cm length x 0cm width x 0cm depth; 0cm^2 area and 0cm^3 volume. There is no tunneling or undermining noted. There is a none present amount of drainage noted. The wound margin is well defined and not attached to the wound base. There is no granulation within the wound bed. There is no necrotic tissue within the wound bed. Wound #8 status is Open. Original cause of wound was Blister. The wound is located on the Right,Anterior Lower Leg.  The wound measures 0cm length x 0cm width x 0cm depth; 0cm^2 area and 0cm^3 volume. There is no tunneling or undermining noted. There is a none present amount of drainage noted. The wound margin is flat and intact. There is no granulation within the wound bed. There is no necrotic tissue within the wound bed. 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 There was a Three Layer Compression Therapy Procedure by Carlene Coria, RN. Post procedure Diagnosis Wound #4: Same as Pre-Procedure Wound #7 Pre-procedure diagnosis of Wound #7 is a Diabetic Wound/Ulcer of the Lower Extremity located on the Right,Proximal,Plantar Foot . There was a Three Layer Compression Therapy Procedure by Carlene Coria, RN. Post procedure Diagnosis Wound #7: Same as Pre-Procedure Wound #8 Pre-procedure diagnosis of Wound #8 is a Diabetic Wound/Ulcer of the Lower Extremity located on the Right,Anterior Lower Leg . There was a Three Layer Compression Therapy Procedure by Carlene Coria, RN. Post procedure Diagnosis Wound #  8: Same as Pre-Procedure Plan Follow-up Appointments: Return Appointment in 1 week. Dressing Change Frequency: Do not change entire dressing for one week. Wound Cleansing: Clean wound with Normal Saline. May shower and wash wound with soap and water. Primary Wound Dressing: Wound #4 Right,Distal,Plantar Foot: Calcium Alginate with Silver Wound #7 Right,Proximal,Plantar Foot: Calcium Alginate with Silver Wound  #8 Right,Anterior Lower Leg: Iodoflex Secondary Dressing: Dry Gauze ABD pad Drawtex Edema Control: 3 Layer Compression System - Right Lower Extremity Avoid standing for long periods of time Elevate legs to the level of the heart or above for 30 minutes daily and/or when sitting, a frequency of: - 3 to 4 times a day throughout the day. Off-Loading: Open toe surgical shoe to: - on right foot. 1. The patient has made excellent progress 2. With regards to the abscess and osteomyelitis she has completed 5 weeks of antibiotics. I am going to and watch for now 3. She has lymphedema of her bilateral lower extremities with a wound on the right anterior tibia related to this. This is closed today. The exact reason in the history here is unclear. I may need to discuss compression stockings with her next week. 4. I was concerned about pseudomembranous colitis last week. Her abdominal exam remains benign. The history here is not really suggestive. I feel no further investigations at this point. I am stopping the antibiotics Electronic Signature(s) Signed: 05/22/2019 6:16:53 PM By: Linton Ham MD Entered By: Linton Ham on 05/22/2019 17:36:57 -------------------------------------------------------------------------------- SuperBill Details Patient Name: Date of Service: [name] 05/22/2019 Medical Record [id number] Patient Account Number: [id number] Date of Birth/Sex: Treating RN: 1995/06/23 (24 y.o. Orvan Falconer Primary Care Provider: Baltazar Apo Other Clinician: Referring Provider: Treating Provider/Extender:Aliece Honold, Evlyn Kanner, [place] Dom in Treatment: 9 Diagnosis Coding ICD-10 Codes Code Description E10.621 Type 1 diabetes mellitus with foot ulcer M86.471 Chronic osteomyelitis with draining sinus, right ankle and foot E10.42 Type 1 diabetes mellitus with diabetic polyneuropathy L97.518 Non-pressure chronic ulcer of other part of right foot with  other specified severity L97.512 Non-pressure chronic ulcer of other part of right foot with fat layer exposed L97.811 Non-pressure chronic ulcer of other part of right lower leg limited to breakdown of skin I89.0 Lymphedema, not elsewhere classified Facility Procedures CPT4 Code Description: [id number] (Facility Use Only) [phone] - APPLY MULTLAY COMPRS LWR RT LEG Modifier: Quantity: 1 Physician Procedures CPT4 Code Description: [id number] - WC PHYS LEVEL 3 - EST PT ICD-10 Diagnosis Description E10.621 Type 1 diabetes mellitus with foot ulcer M86.471 Chronic osteomyelitis with draining sinus, right ankle a L97.518 Non-pressure chronic ulcer of other  part of right foot w I89.0 Lymphedema, not elsewhere classified Modifier: nd foot ith other specified Quantity: 1 severity Electronic Signature(s) Signed: 05/22/2019 6:16:53 PM By: Linton Ham MD Entered By: Linton Ham on 05/22/2019 17:37:31

## 2019-05-25 NOTE — Progress Notes (Signed)
Lindsey, Murillo ([id number]) Visit Report for 05/22/2019 Arrival Information Details Patient Name: Date of Service: Lindsey Murillo, Lindsey Murillo 05/22/2019 3:45 PM Medical Record [id number] Patient Account Number: [id number] Date of Birth/Sex: Treating RN: [birth year]Lindsey Murillo (24 y.o. Lindsey Murillo, Lindsey Murillo Primary Care Kathlene Yano: Baltazar Apo Other Clinician: Referring Kewanna Kasprzak: Treating Petrita Blunck/Extender:Robson, Evlyn Kanner, [place] Dom in Treatment: 9 Visit Information History Since Last Visit Added or deleted any medications: No Patient Arrived: Ambulatory Any new allergies or adverse reactions: No Arrival Time: 16:39 Had a fall or experienced change in No Accompanied By: self activities of daily living that may affect Transfer Assistance: None risk of falls: Patient Identification Verified: Yes Signs or symptoms of abuse/neglect since last No Secondary Verification Process Completed: Yes visito Patient Requires Transmission-Based No Hospitalized since last visit: No Precautions: Implantable device outside of the clinic excluding No Patient Has Alerts: No cellular tissue based products placed in the center since last visit: Has Dressing in Place as Prescribed: Yes Has Compression in Place as Prescribed: Yes Pain Present Now: Yes Electronic Signature(s) Signed: 05/25/2019 5:57:08 PM By: Kela Millin Entered By: Kela Millin on 05/22/2019 16:40:57 -------------------------------------------------------------------------------- Compression Therapy Details Patient Name: Date of Service: Lindsey Murillo, Lindsey Murillo 05/22/2019 3:45 PM Medical Record [id number] Patient Account Number: [id number] Date of Birth/Sex: Treating RN: April [birth year], Lindsey Murillo (24 y.o. Lindsey Murillo Primary Care Zarina Pe: Baltazar Apo Other Clinician: Referring Kadan Millstein: Treating Lorin Hauck/Extender:Robson, Evlyn Kanner, [place] Dom in Treatment: 9 Compression Therapy Performed for Wound Wound  #4 Right,Distal,Plantar Foot Assessment: Performed By: Jake Church, RN Compression Type: Three Layer Post Procedure Diagnosis Same as Pre-procedure Electronic Signature(s) Signed: 05/22/2019 6:18:05 PM By: Carlene Coria RN Entered By: Carlene Coria on 05/22/2019 17:24:45 -------------------------------------------------------------------------------- Compression Therapy Details Patient Name: Date of Service: Lindsey Murillo, Lindsey Murillo 05/22/2019 3:45 PM Medical Record [id number] Patient Account Number: [id number] Date of Birth/Sex: Treating RN: [birth year] (24 y.o. Lindsey Murillo Primary Care Kairo Laubacher: Baltazar Apo Other Clinician: Referring Braelen Sproule: Treating Denetta Fei/Extender:Robson, Evlyn Kanner, [place] Dom in Treatment: 9 Compression Therapy Performed for Wound Wound #7 Right,Proximal,Plantar Foot Assessment: Performed By: Jake Church, RN Compression Type: Three Layer Post Procedure Diagnosis Same as Pre-procedure Electronic Signature(s) Signed: 05/22/2019 6:18:05 PM By: Carlene Coria RN Entered By: Carlene Coria on 05/22/2019 17:24:45 -------------------------------------------------------------------------------- Compression Therapy Details Patient Name: Date of Service: Lindsey Murillo, Lindsey Murillo 05/22/2019 3:45 PM Medical Record [id number] Patient Account Number: [id number] Date of Birth/Sex: Treating RN: [birth year] (24 y.o. Lindsey Murillo Primary Care Laurelin Elson: Baltazar Apo Other Clinician: Referring Rickayla Wieland: Treating Aryia Delira/Extender:Robson, Evlyn Kanner, [place] Dom in Treatment: 9 Compression Therapy Performed for Wound Wound #8 Right,Anterior Lower Leg Assessment: Performed By: Jake Church, RN Compression Type: Three Layer Post Procedure Diagnosis Same as Pre-procedure Electronic Signature(s) Signed: 05/22/2019 6:18:05 PM By: Carlene Coria RN Entered By: Carlene Coria on 05/22/2019  17:24:45 -------------------------------------------------------------------------------- Encounter Discharge Information Details Patient Name: Date of Service: Lindsey Murillo. 05/22/2019 3:45 PM Medical Record [id number] Patient Account Number: [id number] Date of Birth/Sex: Treating RN: [birth year] (24 y.o. Lindsey Murillo Primary Care Ommie Degeorge: Baltazar Apo Other Clinician: Referring Daphine Loch: Treating Koston Hennes/Extender:Robson, Evlyn Kanner, [place] Dom in Treatment: 9 Encounter Discharge Information Items Discharge Condition: Stable Ambulatory Status: Ambulatory Discharge Destination: Home Transportation: Private Auto Accompanied By: self Schedule Follow-up Appointment: Yes Clinical Summary of Care: Patient Declined Electronic Signature(s) Signed: 05/25/2019 5:57:08 PM By: Kela Millin Entered By: Kela Millin on 05/22/2019 18:13:17 -------------------------------------------------------------------------------- Lower Extremity Assessment Details Patient Name: Date of Service: Lindsey Murillo, Lindsey Murillo 05/22/2019 3:45 PM Medical Record [id number] Patient Account Number: [id number] Date of Birth/Sex:  Treating RN: [birth year] (24 y.o. Lindsey Murillo Primary Care Goldie Tregoning: Baltazar Apo Other Clinician: Referring Parrie Rasco: Treating Estus Krakowski/Extender:Robson, Evlyn Kanner, [place] Dom in Treatment: 9 Edema Assessment Assessed: [Left: No] [Right: No] Edema: [Left: Ye] [Right: s] Calf Left: Right: Point of Measurement: cm From Medial Instep cm 31.5 cm Ankle Left: Right: Point of Measurement: cm From Medial Instep cm 20.5 cm Vascular Assessment Pulses: Dorsalis Pedis Palpable: [Right:Yes] Electronic Signature(s) Signed: 05/25/2019 5:57:08 PM By: Kela Millin Entered By: Kela Millin on 05/22/2019 16:48:03 -------------------------------------------------------------------------------- Multi Wound Chart Details Patient  Name: Date of Service: Lindsey Murillo. 05/22/2019 3:45 PM Medical Record [id number] Patient Account Number: [id number] Date of Birth/Sex: Treating RN: [birth year], Lindsey Murillo (24 y.o. 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] [7:Lower Extremity] [8:Lower Extremity] Secondary Etiology: [4:N/A] [7:N/A] [8:Lymphedema] Comorbid History: [4:Anemia, Type I Diabetes Anemia, Type I Diabetes Anemia, Type I Diabetes] Date Acquired: [4:01/09/2019] [7:01/09/2019] [8:03/18/2019] Weeks of Treatment: [4:9] [7:9] [8:9] Wound Status: [4:Open] [7:Open] [8:Open] Measurements L x W x D 0.2x0.2x1 [7:0x0x0] [8:0x0x0] (cm) Area (cm) : [4:0.031] [7:0] [8:0] Volume (cm) : [4:0.031] [7:0] [8:0] % Reduction in Area: [4:99.40%] [7:100.00%] [8:100.00%] % Reduction in Volume: 97.20% [7:100.00%] [8:100.00%] Position 1 (o'clock): 3 Maximum Distance 1 [4:2.9] (cm): Tunneling: [4:Yes] [7:No] [8:No] Classification: [4:Grade 3] [7:Grade 2] [8:Grade 1] Exudate Amount: [4:Small] [7:None Present] [8:None Present] Exudate Type: [4:Serous] [7:N/A] [8:N/A] Exudate Color: [4:amber] [7:N/A] [8:N/A] Wound Margin: [4:Well defined, not attached] [7:Well defined, not attached] [8:Flat and Intact] Granulation Amount: [4:Medium (34-66%)] [7:None Present (0%)] [8:None Present  (0%)] Granulation Quality: [4:Pink] [7:N/A] [8:N/A] Necrotic Amount: [4:None Present (0%)] [7:None Present (0%)] [8:None Present (0%)] Exposed Structures: [4:Fat Layer (Subcutaneous Tissue) Exposed: Yes Fascia: No Tendon: No Muscle: No Joint: No Bone: No] [7:Fascia: No Fat Layer (Subcutaneous Tissue) Exposed: No Tendon: No Muscle: No Joint: No Bone: No] [8:Fascia: No Fat Layer (Subcutaneous Tissue)  Exposed: No Tendon: No Muscle: No Joint: No Bone: No] Epithelialization: [4:Small (1-33%) Compression Therapy] [7:Large (67-100%) Compression Therapy] [8:Large (67-100%) Compression Therapy] Treatment Notes Electronic Signature(s) Signed: 05/22/2019 6:16:53 PM By: Linton Ham MD Entered By: Linton Ham on 05/22/2019 17:28:55 -------------------------------------------------------------------------------- Multi-Disciplinary Care Plan Details Patient Name: Date of Service: Lindsey Murillo. 05/22/2019 3:45 PM Medical Record [id number] Patient Account Number: [id number] Date of Birth/Sex: Treating RN: [birth year] (23 y.o. Lindsey Murillo Primary Care Marrian Bells: Baltazar Apo Other Clinician: Referring Mohab Ashby: Treating Emmilee Reamer/Extender:Robson, Evlyn Kanner, [place] Dom in Treatment: 9 Active Inactive Wound/Skin Impairment Nursing Diagnoses: Knowledge deficit related to ulceration/compromised skin integrity Goals: Patient/caregiver will verbalize understanding of skin care regimen Date Initiated: 03/20/2019 Target Resolution Date: 05/25/2019 Goal Status: Active Ulcer/skin breakdown will have a volume reduction of 30% by week 4 Date Initiated: 03/20/2019 Date Inactivated: 04/24/2019 Target Resolution Date: 04/20/2019 Unmet Goal Status: Unmet Reason: COMORBITIES/ NON COMPLAINCE Ulcer/skin breakdown will have a volume reduction of 50% by week 8 Date Initiated: 04/24/2019 Target Resolution Date: 05/25/2019 Goal Status: Active Interventions: Assess patient/caregiver  ability to obtain necessary supplies Assess patient/caregiver ability to perform ulcer/skin care regimen upon admission and as needed Assess ulceration(s)  every visit Notes: Electronic Signature(s) Signed: 05/22/2019 6:18:05 PM By: Carlene Coria RN Entered By: Carlene Coria on 05/22/2019 16:18:38 -------------------------------------------------------------------------------- Pain Assessment Details Patient Name: Date of Service: Lindsey Murillo, Lindsey Murillo 05/22/2019 3:45 PM Medical Record [id number] Patient Account Number: [id number] Date of Birth/Sex: Treating RN: Lindsey Murillo[birth year] (24 y.o. Lindsey Murillo Primary Care Bacilio Abascal: Baltazar Apo Other Clinician: Referring Gunhild Bautch: Treating Rodert Hinch/Extender:Robson, Evlyn Kanner, [place] Dom in Treatment: 9 Active Problems Location of Pain Severity and Description of Pain Patient Has Paino Yes Site Locations Pain Location: Pain in Ulcers With Dressing Change: Yes Rate the pain. Current Pain Level: 4 Worst Pain Level: 9 Least Pain Level: 4 Tolerable Pain Level: 4 Character of Pain Describe the Pain: Stabbing Pain Management and Medication Current Pain Management: Electronic Signature(s) Signed: 05/25/2019 5:57:08 PM By: Kela Millin Entered By: Kela Millin on 05/22/2019 16:41:28 -------------------------------------------------------------------------------- Patient/Caregiver Education Details Lindsey Murillo, Lindsey Murillo 1/12/2021andnbsp3:45 Patient Name: Date of Service: [name] Medical Record Patient Account Number: [id number] Number: Treating RN: Carlene Coria Date of Birth/Gender: [birth year] Lindsey Murillo (23 y.o. F) Other Clinician: Primary Care Physician: Laverta Baltimore Referring Physician: Physician/Extender: Marylin Crosby in Treatment: 9 Education Assessment Education Provided To: Patient Education Topics Provided Wound/Skin Impairment: Methods: Explain/Verbal Responses:  State content correctly Electronic Signature(s) Signed: 05/22/2019 6:18:05 PM By: Carlene Coria RN Entered By: Carlene Coria on 05/22/2019 16:18:54 -------------------------------------------------------------------------------- Wound Assessment Details Patient Name: Date of Service: Lindsey Murillo, Lindsey Murillo 05/22/2019 3:45 PM Medical Record [id number] Patient Account Number: [id number] Date of Birth/Sex: Treating RN: Lindsey Murillo[birth year] (24 y.o. Lindsey Murillo Primary Care Gale Hulse: Baltazar Apo Other Clinician: Referring Llesenia Fogal: Treating Savannah Erbe/Extender:Robson, Evlyn Kanner, [place] Dom in Treatment: 9 Wound Status Wound Number: 4 Primary Diabetic Wound/Ulcer of the Lower Etiology: Extremity Wound Location: Right Foot - Plantar, Distal Wound Status: Open Wounding Event: Gradually Appeared Comorbid Anemia, Type I Diabetes Date Acquired: 01/09/2019 History: Weeks Of Treatment: 9 Clustered Wound: No Photos Wound Measurements Length: (cm) 0.2 Width: (cm) 0.2 Depth: (cm) 1 Area: (cm) 0.031 Volume: (cm) 0.031 % Reduction in Area: 99.4% % Reduction in Volume: 97.2% Epithelialization: Small (1-33%) Tunneling: Yes Position (o'clock): 3 Maximum Distance: (cm) 2.9 Undermining: No Wound Description Classification: Grade 3 Foul Odor A Wound Margin: Well defined, not attached Slough/Fibr Exudate Amount: Small Exudate Type: Serous Exudate Color: amber Wound Bed Granulation Amount: Medium (34-66%) Granulation Quality: Pink Fascia Expo Necrotic Amount: None Present (0%) Fat Layer ( Tendon Expo Muscle Expo Joint Expos Bone Expose fter Cleansing: No ino No Exposed Structure sed: No Subcutaneous Tissue) Exposed: Yes sed: No sed: No ed: No d: No Treatment Notes Wound #4 (Right, Distal, Plantar Foot) 1. Cleanse With Wound Cleanser Soap and water 2. Periwound Care Moisturizing lotion 3. Primary Dressing Applied Calcium Alginate Ag 4. Secondary Dressing Dry  Gauze 6. Support Layer Applied 3 layer compression wrap Notes silver alginate packing Electronic Signature(s) Signed: 05/24/2019 3:33:56 PM By: Mikeal Hawthorne EMT/HBOT Signed: 05/25/2019 5:57:08 PM By: Kela Millin Entered By: Mikeal Hawthorne on 05/24/2019 13:43:40 -------------------------------------------------------------------------------- Wound Assessment Details Patient Name: Date of Service: Lindsey Murillo 05/22/2019 3:45 PM Medical Record [id number] Patient Account Number: [id number] Date of Birth/Sex: Treating RN: [birth year]Lindsey Murillo (24 y.o. Lindsey Murillo Primary Care Truman Aceituno: Baltazar Apo Other Clinician: Referring Ahmadou Bolz: Treating Kinza Gouveia/Extender:Robson, Evlyn Kanner, [place] Dom in Treatment: 9 Wound Status Wound Number: 7 Primary Diabetic Wound/Ulcer of the Lower Etiology: Extremity Wound Location: Right Foot - Plantar, Proximal Wound Status: Healed - Epithelialized Wounding Event: Gradually Appeared Comorbid Anemia, Type I Diabetes Date Acquired: 01/09/2019 History: Weeks  Of Treatment: 9 Clustered Wound: No Photos Wound Measurements Length: (cm) 0 % Reduct Width: (cm) 0 % Reduct Depth: (cm) 0 Epitheli Area: (cm) 0 Tunneli Volume: (cm) 0 Undermi Wound Description Classification: Grade 2 Foul Odo Wound Margin: Well defined, not attached Slough/F Exudate Amount: None Present Wound Bed Granulation Amount: None Present (0%) Necrotic Amount: None Present (0%) Fascia E Fat Laye Tendon E Muscle E Joint Ex Bone Exp r After Cleansing: No ibrino No Exposed Structure xposed: No r (Subcutaneous Tissue) Exposed: No xposed: No xposed: No posed: No osed: No ion in Area: 100% ion in Volume: 100% alization: Large (67-100%) ng: No ning: No Electronic Signature(s) Signed: 05/24/2019 3:33:56 PM By: Mikeal Hawthorne EMT/HBOT Signed: 05/25/2019 5:57:08 PM By: Kela Millin Entered By: Mikeal Hawthorne on 05/24/2019  13:44:58 -------------------------------------------------------------------------------- Wound Assessment Details Patient Name: Date of Service: Lindsey Murillo 05/22/2019 3:45 PM Medical Record [id number] Patient Account Number: [id number] Date of Birth/Sex: Treating RN: [birth year] (24 y.o. Lindsey Murillo Primary Care Juliocesar Blasius: Baltazar Apo Other Clinician: Referring Correne Lalani: Treating Corrin Hingle/Extender:Robson, Evlyn Kanner, [place] Dom in Treatment: 9 Wound Status Wound Number: 8 Primary Etiology: Diabetic Wound/Ulcer of the Lower Extremity Wound Location: Right Lower Leg - Anterior Secondary Lymphedema Wounding Event: Blister Etiology: Date Acquired: 03/18/2019 Wound Status: Healed - Epithelialized Weeks Of Treatment: 9 Comorbid Anemia, Type I Diabetes Clustered Wound: No History: Photos Wound Measurements Length: (cm) 0 % Reductio Width: (cm) 0 % Reductio Depth: (cm) 0 Epithelial Area: (cm) 0 Tunneling Volume: (cm) 0 Undermini Wound Description Classification: Grade 1 Foul Odor Wound Margin: Flat and Intact Slough/Fib Exudate Amount: None Present Wound Bed Granulation Amount: None Present (0%) Necrotic Amount: None Present (0%) Fascia Exp Fat Layer Tendon Exp Muscle Exp Joint Expo Bone Expos Electronic Signature(s) Signed: 05/24/2019 3:33:56 PM By: Mikeal Hawthorne EMT/HBOT Signed: 05/25/2019 5:57:08 PM By: Kela Millin Entered By: Mikeal Hawthorne on 01/14/ After Cleansing: No rino No Exposed Structure osed: No (Subcutaneous Tissue) Exposed: No osed: No osed: No sed: No ed: No 2021 13:44:07 n in Area: 100% n in Volume: 100% ization: Large (67-100%) : No ng: No -------------------------------------------------------------------------------- Vitals Details Patient Name: Date of Service: NYLAH, WASH 05/22/2019 3:45 PM Medical Record [id number] Patient Account Number: [id number] Date of Birth/Sex: Treating  RN: [birth year], Lindsey Murillo (24 y.o. Lindsey Murillo Primary Care Tauheed Mcfayden: Baltazar Apo Other Clinician: Referring Tieara Flitton: Treating Daphene Chisholm/Extender:Robson, Evlyn Kanner, [place] Dom in Treatment: 9 Vital Signs Time Taken: 16:40 Temperature (F): 98 Height (in): 68 Pulse (bpm): 124 Weight (lbs): 137 Respiratory Rate (breaths/min): 18 Body Mass Index (BMI): 20.8 Blood Pressure (mmHg): 144/99 Reference Range: 80 - 120 mg / dl Electronic Signature(s) Signed: 05/25/2019 5:57:08 PM By: Kela Millin Entered By: Kela Millin on 05/22/2019 16:40:24

## 2019-05-29 ENCOUNTER — Other Ambulatory Visit: Payer: Self-pay

## 2019-05-29 ENCOUNTER — Encounter (HOSPITAL_BASED_OUTPATIENT_CLINIC_OR_DEPARTMENT_OTHER): Payer: 59 | Attending: Internal Medicine | Admitting: Internal Medicine

## 2019-05-29 DIAGNOSIS — E10621 Type 1 diabetes mellitus with foot ulcer: Secondary | ICD-10-CM | POA: Diagnosis not present

## 2019-05-30 ENCOUNTER — Ambulatory Visit: Payer: Self-pay | Admitting: Gastroenterology

## 2019-05-30 NOTE — Progress Notes (Signed)
Lindsey Murillo ([id number]) Visit Report for 05/29/2019 Debridement Details Patient Name: Lindsey Murillo, Lindsey Murillo. Date of Service: 05/29/2019 3:45 PM Medical Record [id number] Patient Account Number: [id number] Date of Birth/Sex: Treating RN: 08/18/1995 (24 y.o. F) Primary Care Provider: Baltazar Apo Other Clinician: Referring Provider: Treating Provider/Extender:Rachella Basden, Evlyn Kanner, [place] Dom in Treatment: 10 Debridement Performed for Wound #4 Right,Distal,Plantar Foot Assessment: Performed By: Physician Ricard Dillon., MD Debridement Type: Debridement Severity of Tissue Pre Fat layer exposed Debridement: Level of Consciousness (Pre- Awake and Alert procedure): Pre-procedure Verification/Time Out Taken: Yes - 16:14 Start Time: 16:14 Total Area Debrided (L x W): 0.2 (cm) x 0.2 (cm) = 0.04 (cm) Tissue and other material Viable, Non-Viable, Subcutaneous, Skin: Dermis , Skin: Epidermis debrided: Level: Skin/Subcutaneous Tissue Debridement Description: Excisional Instrument: Curette Bleeding: Minimum Hemostasis Achieved: Pressure End Time: 16:16 Procedural Pain: 0 Post Procedural Pain: 0 Response to Treatment: Procedure was tolerated well Level of Consciousness Awake and Alert (Post-procedure): Post Debridement Measurements of Total Wound Length: (cm) 0.4 Width: (cm) 0.3 Depth: (cm) 0.8 Volume: (cm) 0.075 Character of Wound/Ulcer Post Improved Debridement: Severity of Tissue Post Debridement: Fat layer exposed Post Procedure Diagnosis Same as Pre-procedure Electronic Signature(s) Signed: 05/29/2019 5:52:07 PM By: Linton Ham MD Entered By: Linton Ham on 05/29/2019 16:21:20 -------------------------------------------------------------------------------- HPI Details Patient Name: Date of Service: Lindsey Murillo 05/29/2019 3:45 PM Medical Record [id number] Patient Account Number: [id number] Date of Birth/Sex: Treating  RN: [birth year] (24 y.o. F) Primary Care Provider: Baltazar Apo Other Clinician: Referring Provider: Treating Provider/Extender:Neven Fina, Evlyn Kanner, [place] Dom in Treatment: 10 History of Present Illness HPI Description: 06/16/2018 ADMISSION This is a 24 year old type I diabetic patient. She was referred by her family physician Dr. Wolfgang Phoenix in [place] where they live. She had been seen in the urgent care at [facility] on 05/27/2018 with a large bulla on the right dorsal foot. An x-ray of the foot showed swelling of the base of her first toe and first MTP with no soft tissue gas or bony erosion. She came to the primary physician's attention in follow-up on 06/09/2018 noted to have a diabetic foot ulcer. She was advised to take work off. She has been using peroxide and Neosporin to the wound. Past medical history; the patient does not have a prior history of diabetic foot ulcers. She is listed as having both peripheral neuropathy and autonomic neuropathy. Chronic diarrhea thoracic back pain and anemia. ABIs in our clinic were noncompressible on the right 2/11; follow-up for this badly infected wound that we admitted to the clinic on 2/7. The area in question is on the right first metatarsal head. When she arrived in clinic last week there was swelling and erythema in the foot as well as on the lateral part of the first metatarsal head. Plain x-rays showed soft tissue wound with soft tissue gas noted overlying the first MTP no radiographic changes of osteomyelitis. The culture showed abundant group B strep. The Augmentin that I chose empirically last time clearly the right choice. She is offloading this in a healing sandal. She is not working. She states her diabetes is under reasonably good control 2/14; continued follow-up. The area in question which is on the right first metatarsal head looks improved although still requiring debridement were using silver alginate. She is completing 10  days of Augmentin which she has tolerated well. According to the patient she is offloading this quite aggressively. She is still in a surgical shoe 2/21; continued follow-up. Right first metatarsal head generally improved but still  requiring debridement there is less swelling and no erythema. She is offloading this in a surgical shoe. 2/28; right first metatarsal head generally improved but still requiring debridement we have been using Hydrofera Blue she is using a surgical shoe but I changed her to a Darco forefoot off loader today. Possible she will ultimately require a total contact cast 3/6 right first metatarsal head generally improved there is less adherent debris and generally washes off nicely with Anasept and gauze. Using Hydrofera Blue and a Darco. We are going to consider putting her in a total contact cast today however we did not have sufficient supplies 3/13; our intake nurse reported serosanguineous drainage which was concerning. The patient denies any systemic symptoms we have been using Hydrofera Blue in today with anticipation of putting on a total contact cast. However we also noted swelling around the wound. 3/20 -Right first metatarsal head wound appears granulating, without areas of necrosis or debris surrounding skin appears good, she is completed or almost completed her Keflex course. We will continue with the alginate and she is doing the offloading with the surgical shoe and felt. 3/27; right first metatarsal head. There is no evidence of infection currently. She has senescent looking wound edges and some debris on the wound surface but in general things look quite a bit better today. We have been using alginate and offloading her with a forefoot off loader. I am not going to consider total contact casting her in this environment until I can be certain with that the clinic will be open to deal with the obligatory first cast change in the routine changing 4/3; right  first metatarsal head. No evidence of infection. The tissue looks healthy although there is some tunneling laterally towards the mid part of the foot. We are using silver alginate, I have changed that the silver collagen today. 4/9 right first metatarsal head. No evidence of infection. Using silver collagen. 4/17 right first metatarsal head the wound is actually larger today and I think deeper. There is no evidence of recurrent infection. 4/20; back for her obligatory first total contact cast change. The wound on her foot actually looks quite good on the right first metatarsal head however she clearly has a cast injury at the superior margin of the cast just at the folded level this was in spite of padding this area. 4/24; wound on the right first metatarsal head looks better. The cast injury wounds on the anterior tibial area also look better. 5/1 wound on the right first metatarsal head is closed over. The cost injury wounds on the anterior tibial are also closed. The patient has type 1 diabetes with polyneuropathy. I do not think she has the financial means for diabetic shoes with custom inserts READMISSION 03/20/2019 This is a patient with type 1 diabetes and polyneuropathy. She was seen earlier this year with a wound on the right first metatarsal head the closed over. We recommended that she offload this in her shoes. I do not believe that was done but in any case her wounds reopened sometime in October. She was seen in the ER on 10/20 with a diabetic foot ulcer. Right foot was swollen and erythematous. She was put on antibiotics although at the time of this dictation I am not exactly sure which one. An x-ray was done that showed no osteo-. She has been soaking her foot in Epson salts and applying dry gauze. She is still working at [employer]. She has 4 open areas; one at the  base of the second through fourth toes. One at the first met head 1 over the second met head and 1 over the  midfoot. The second met head wound probes proximally towards her midfoot and midfoot wound probes superiorly although I could not connect these 2 wounds. There is no purulent drainage. A considerable amount of denuded skin over this area which I think is a remanent of cellulitis although the medial part of her foot is still erythematous there is no tenderness. For the most part I think this looks like treated cellulitis from her antibiotics in October. She has a superficial area of epithelial loss on the right lower mid tibia Past medical history; patient is a type I diabetic with polyneuropathy. She is not felt to have PAD.Marland Kitchen She is unaware of her hemoglobin A1c she is supposed to see an endocrinologist this afternoon. 11/19; MRI is tomorrow. Culture I did last time showed group G strep. This would have been covered by the empiric Augmentin I gave her when I first saw her. She should have 3 more days of this I am going to extend this by another 4 days until we have time to digest the MRI. If she has osteomyelitis she is going to need to see infectious disease for IV antibiotics. She has not been systemically unwell. She is tolerating the Augmentin well 12/1; the patient comes in today stating that her MRI that was supposed to be done on 11/20 was canceled by the hospital. She does not have an appointment. She has completed the Augmentin I gave her for the culture showing group G strep. Paradoxically she has less of an open area on the proximal foot just above her plantar metatarsals the midfoot also seems to be probing less and the left met head is closed. She also has a fairly extensive area on the right mid tibia in the setting of what looks to be lymphedema. According to our records this was present on admission but I do not think I looked at this specifically the last time she was here. 12/8; I finally had to send this patient to the ER in order to get an MRI performed. The MRI showed  findings consistent with osteomyelitis in the head and neck of the fifth metatarsal and the base and proximal phalanx of the fifth toe. There is also an abscess in the subcutaneous tissues dorsal to the fifth MTP and proximal phalanx of the little toe. Also noted that there is an abscess in the plantar soft tissues of the foot just proximal to the toes which extends from the great toe to the fourth toe. They did do an MRI of the tib-fib which was negative for underlying bone issues. Culture I did last week showed abundant Serratia and moderate group B strep They did not admit her last week from the ER which to me was surprising. She has purulent drainage coming out of the small wound at roughly the third met head. Erythema and swelling of the forefoot. She continues to have a large wound on the anterior mid tibia on the right 12/15; the patient still has the 2 small open areas over the second met head. Still draining purulent drainage although the entire foot looks less erythematous and less swollen. She still has the small area in the midfoot She has lymphedema in the upper leg the area on the mid tibia still requiring debridement but looks better. 12/22; single wound in the area over the second met head. Still with  a purulent drainage although less so. The entire foot looks less erythematous and less swollen. But both are still present. The area on the midfoot is smaller. We have been using silver alginate to both of these areas -The area on the anterior tibial area looks healthier. We are using Iodoflex here under compression She still does not have an appointment with Dr. Sharol Given. This is in spite of the fact that it was suggested she get one when she left the ER and that we have been working on this for 2 weeks. Will call his office tomorrow 12/29; the patient is seen Dr. Sharol Given tomorrow about the underlying osteomyelitis involving the fifth metatarsal and proximal phalanx of the fifth toe as well  as the abscess in the plantar foot. On MRI. I have had her on a fairly prolonged course of cefdinir 300 twice daily. The abscess in the right plantar forefoot distal to the metatarsal heads. She still has 2 probing wounds in this area. At one point she had an area over the fifth metatarsal head that connected with an area on the midfoot. Both of these areas are closed. There is a lot less swelling and erythema in the forefoot. The area on the right anterior mid tibia looks a lot better. We have been using Iodoflex on this area. 05/15/2019. Patient saw Dr. Sharol Given yesterday. The MRI. He did not think that the edema within the medial cuneiform was related to osteomyelitis rather more than a stress reaction. The edema at the MTP joint of the little toe appeared to be more consistent with chronic osteo per his note. He wanted a watchful waiting approach. Noted that she is not on doxycycline but she is on cefdinir. The patient's foot continues to look better. There is much less erythema and forefoot edema. At one point copious amounts of purulent drainage coming out of the small wound over her left third met head wound. Connections with the wound on the plantar foot were also noted. 1/12; the patient's wound on the right anterior lower leg which was in the setting of lymphedema is closed. She still has the draining areas just distal to her metatarsal heads. 1 of these looks as though they are about to close over. The larger one which is a tiny opening still probes towards her first toe. Wound in the middle of her foot is healed. With regards to the osteomyelitis and abscess I have had her on a 5-week course of cefdinir. She will complete this in 2 days I think antibiotics can stop at that point. She gave a concerning history of diarrhea last time although the history seems a lot less worrisome today to semiformed bowel movements a day 1/19; the patient has completed her cefdinir. The foot is considerably  better than when she came in where I thought it was quite likely she would lose a good part of it if not to complete foot. The only open area is over the third metatarsal head superiorly and now has about 1.5 cm of undermining but it was difficult to measure. At 1 point this tract right around the plantar surface of her foot and over to the first metatarsal head. Electronic Signature(s) Signed: 05/29/2019 5:52:07 PM By: Linton Ham MD Entered By: Linton Ham on 05/29/2019 16:22:21 -------------------------------------------------------------------------------- Physical Exam Details Patient Name: Date of Service: Lindsey Murillo, Lindsey Murillo 05/29/2019 3:45 PM Medical Record [id number] Patient Account Number: [id number] Date of Birth/Sex: Treating RN: 1995-06-25 (24 y.o. F) Primary Care Provider: Baltazar Apo Other  Clinician: Referring Provider: Treating Provider/Extender:Bertrum Helmstetter, Evlyn Kanner, [place] Dom in Treatment: 10 Constitutional Patient is hypertensive.. Pulse regular and within target range for patient.Marland Kitchen Respirations regular, non-labored and within target range.. Temperature is normal and within the target range for the patient.Marland Kitchen Appears in no distress. Notes Wound exam; she has resolution of the forefoot swelling and erythema. Still the single wound I think just above her third metatarsal head has some depth and some undermining. However the orifice of this was exceptionally small. Using a #3 curette I removed skin and subcutaneous tissue from around the wound margin to give [place] better access. No evidence of surrounding infection The area on the right anterior mid tibia is closed Electronic Signature(s) Signed: 05/29/2019 5:52:07 PM By: Linton Ham MD Entered By: Linton Ham on 05/29/2019 16:23:26 -------------------------------------------------------------------------------- Physician Orders Details Patient Name: Date of Service: Lindsey Murillo.  05/29/2019 3:45 PM Medical Record [id number] Patient Account Number: [id number] Date of Birth/Sex: Treating RN: 20-Nov-1995 (24 y.o. Orvan Falconer Primary Care Provider: Baltazar Apo Other Clinician: Referring Provider: Treating Provider/Extender:Antonya Leeder, Evlyn Kanner, [place] Dom in Treatment: 10 Verbal / Phone Orders: No Diagnosis Coding ICD-10 Coding Code Description E10.621 Type 1 diabetes mellitus with foot ulcer M86.471 Chronic osteomyelitis with draining sinus, right ankle and foot E10.42 Type 1 diabetes mellitus with diabetic polyneuropathy L97.518 Non-pressure chronic ulcer of other part of right foot with other specified severity L97.512 Non-pressure chronic ulcer of other part of right foot with fat layer exposed I89.0 Lymphedema, not elsewhere classified Follow-up Appointments Return Appointment in 1 week. Dressing Change Frequency Do not change entire dressing for one week. Wound Cleansing Clean wound with Normal Saline. May shower and wash wound with soap and water. Primary Wound Dressing Wound #4 Right,Distal,Plantar Foot Iodoflex Secondary Dressing Dry Gauze ABD pad Drawtex Edema Control 3 Layer Compression System - Right Lower Extremity Avoid standing for long periods of time Elevate legs to the level of the heart or above for 30 minutes daily and/or when sitting, a frequency of: - 3 to 4 times a day throughout the day. Off-Loading Open toe surgical shoe to: - on right foot. Electronic Signature(s) Signed: 05/29/2019 5:52:07 PM By: Linton Ham MD Signed: 05/30/2019 5:42:11 PM By: Carlene Coria RN Entered By: Carlene Coria on 05/29/2019 16:18:21 -------------------------------------------------------------------------------- Problem List Details Patient Name: Date of Service: Lindsey Murillo 05/29/2019 3:45 PM Medical Record [id number] Patient Account Number: [id number] Date of Birth/Sex: Treating RN: [birth year] (24 y.o. Orvan Falconer Primary Care Provider: Baltazar Apo Other Clinician: Referring Provider: Treating Provider/Extender:Alwaleed Obeso, Evlyn Kanner, [place] Dom in Treatment: 10 Active Problems ICD-10 Evaluated Encounter Code Description Active Date Today Diagnosis E10.621 Type 1 diabetes mellitus with foot ulcer 03/20/2019 No Yes M86.471 Chronic osteomyelitis with draining sinus, right ankle 04/17/2019 No Yes and foot E10.42 Type 1 diabetes mellitus with diabetic polyneuropathy 03/20/2019 No Yes L97.518 Non-pressure chronic ulcer of other part of right foot 03/20/2019 No Yes with other specified severity L97.512 Non-pressure chronic ulcer of other part of right foot 03/20/2019 No Yes with fat layer exposed I89.0 Lymphedema, not elsewhere classified 03/20/2019 No Yes Inactive Problems ICD-10 Code Description Active Date Inactive Date L03.115 Cellulitis of right lower limb 03/20/2019 03/20/2019 L97.811 Non-pressure chronic ulcer of other part of right lower leg 03/20/2019 03/20/2019 limited to breakdown of skin Resolved Problems Electronic Signature(s) Signed: 05/29/2019 5:52:07 PM By: Linton Ham MD Entered By: Linton Ham on 05/29/2019 16:20:02 -------------------------------------------------------------------------------- Progress Note Details Patient Name: Date of Service: Lindsey Murillo 05/29/2019 3:45 PM Medical Record  [id number] Patient Account Number: [id number] Date of Birth/Sex: Treating RN: Aug 11, 1995 (24 y.o. F) Primary Care Provider: Baltazar Apo Other Clinician: Referring Provider: Treating Provider/Extender:Lenyx Boody, Evlyn Kanner, [place] Dom in Treatment: 10 Subjective History of Present Illness (HPI) 06/16/2018 ADMISSION This is a 24 year old type I diabetic patient. She was referred by her family physician Dr. Wolfgang Phoenix in [place] where they live. She had been seen in the urgent care at [facility] on 05/27/2018 with a large bulla on the right  dorsal foot. An x-ray of the foot showed swelling of the base of her first toe and first MTP with no soft tissue gas or bony erosion. She came to the primary physician's attention in follow-up on 06/09/2018 noted to have a diabetic foot ulcer. She was advised to take work off. She has been using peroxide and Neosporin to the wound. Past medical history; the patient does not have a prior history of diabetic foot ulcers. She is listed as having both peripheral neuropathy and autonomic neuropathy. Chronic diarrhea thoracic back pain and anemia. ABIs in our clinic were noncompressible on the right 2/11; follow-up for this badly infected wound that we admitted to the clinic on 2/7. The area in question is on the right first metatarsal head. When she arrived in clinic last week there was swelling and erythema in the foot as well as on the lateral part of the first metatarsal head. Plain x-rays showed soft tissue wound with soft tissue gas noted overlying the first MTP no radiographic changes of osteomyelitis. The culture showed abundant group B strep. The Augmentin that I chose empirically last time clearly the right choice. She is offloading this in a healing sandal. She is not working. She states her diabetes is under reasonably good control 2/14; continued follow-up. The area in question which is on the right first metatarsal head looks improved although still requiring debridement were using silver alginate. She is completing 10 days of Augmentin which she has tolerated well. According to the patient she is offloading this quite aggressively. She is still in a surgical shoe 2/21; continued follow-up. Right first metatarsal head generally improved but still requiring debridement there is less swelling and no erythema. She is offloading this in a surgical shoe. 2/28; right first metatarsal head generally improved but still requiring debridement we have been using Hydrofera Blue she is using a surgical  shoe but I changed her to a Darco forefoot off loader today. Possible she will ultimately require a total contact cast 3/6 right first metatarsal head generally improved there is less adherent debris and generally washes off nicely with Anasept and gauze. Using Hydrofera Blue and a Darco. We are going to consider putting her in a total contact cast today however we did not have sufficient supplies 3/13; our intake nurse reported serosanguineous drainage which was concerning. The patient denies any systemic symptoms we have been using Hydrofera Blue in today with anticipation of putting on a total contact cast. However we also noted swelling around the wound. 3/20 -Right first metatarsal head wound appears granulating, without areas of necrosis or debris surrounding skin appears good, she is completed or almost completed her Keflex course. We will continue with the alginate and she is doing the offloading with the surgical shoe and felt. 3/27; right first metatarsal head. There is no evidence of infection currently. She has senescent looking wound edges and some debris on the wound surface but in general things look quite a bit better today. We have been using alginate and  offloading her with a forefoot off loader. I am not going to consider total contact casting her in this environment until I can be certain with that the clinic will be open to deal with the obligatory first cast change in the routine changing 4/3; right first metatarsal head. No evidence of infection. The tissue looks healthy although there is some tunneling laterally towards the mid part of the foot. We are using silver alginate, I have changed that the silver collagen today. 4/9 right first metatarsal head. No evidence of infection. Using silver collagen. 4/17 right first metatarsal head the wound is actually larger today and I think deeper. There is no evidence of recurrent infection. 4/20; back for her obligatory first  total contact cast change. The wound on her foot actually looks quite good on the right first metatarsal head however she clearly has a cast injury at the superior margin of the cast just at the folded level this was in spite of padding this area. 4/24; wound on the right first metatarsal head looks better. The cast injury wounds on the anterior tibial area also look better. 5/1 wound on the right first metatarsal head is closed over. The cost injury wounds on the anterior tibial are also closed. The patient has type 1 diabetes with polyneuropathy. I do not think she has the financial means for diabetic shoes with custom inserts READMISSION 03/20/2019 This is a patient with type 1 diabetes and polyneuropathy. She was seen earlier this year with a wound on the right first metatarsal head the closed over. We recommended that she offload this in her shoes. I do not believe that was done but in any case her wounds reopened sometime in October. She was seen in the ER on 10/20 with a diabetic foot ulcer. Right foot was swollen and erythematous. She was put on antibiotics although at the time of this dictation I am not exactly sure which one. An x-ray was done that showed no osteo-. She has been soaking her foot in Epson salts and applying dry gauze. She is still working at [employer]. She has 4 open areas; one at the base of the second through fourth toes. One at the first met head 1 over the second met head and 1 over the midfoot. The second met head wound probes proximally towards her midfoot and midfoot wound probes superiorly although I could not connect these 2 wounds. There is no purulent drainage. A considerable amount of denuded skin over this area which I think is a remanent of cellulitis although the medial part of her foot is still erythematous there is no tenderness. For the most part I think this looks like treated cellulitis from her antibiotics in October. She has a superficial area of  epithelial loss on the right lower mid tibia Past medical history; patient is a type I diabetic with polyneuropathy. She is not felt to have PAD.Marland Kitchen She is unaware of her hemoglobin A1c she is supposed to see an endocrinologist this afternoon. 11/19; MRI is tomorrow. Culture I did last time showed group G strep. This would have been covered by the empiric Augmentin I gave her when I first saw her. She should have 3 more days of this I am going to extend this by another 4 days until we have time to digest the MRI. If she has osteomyelitis she is going to need to see infectious disease for IV antibiotics. She has not been systemically unwell. She is tolerating the Augmentin well 12/1; the  patient comes in today stating that her MRI that was supposed to be done on 11/20 was canceled by the hospital. She does not have an appointment. She has completed the Augmentin I gave her for the culture showing group G strep. Paradoxically she has less of an open area on the proximal foot just above her plantar metatarsals the midfoot also seems to be probing less and the left met head is closed. She also has a fairly extensive area on the right mid tibia in the setting of what looks to be lymphedema. According to our records this was present on admission but I do not think I looked at this specifically the last time she was here. 12/8; I finally had to send this patient to the ER in order to get an MRI performed. The MRI showed findings consistent with osteomyelitis in the head and neck of the fifth metatarsal and the base and proximal phalanx of the fifth toe. There is also an abscess in the subcutaneous tissues dorsal to the fifth MTP and proximal phalanx of the little toe. Also noted that there is an abscess in the plantar soft tissues of the foot just proximal to the toes which extends from the great toe to the fourth toe. They did do an MRI of the tib-fib which was negative for underlying bone issues. Culture  I did last week showed abundant Serratia and moderate group B strep They did not admit her last week from the ER which to me was surprising. She has purulent drainage coming out of the small wound at roughly the third met head. Erythema and swelling of the forefoot. She continues to have a large wound on the anterior mid tibia on the right 12/15; the patient still has the 2 small open areas over the second met head. Still draining purulent drainage although the entire foot looks less erythematous and less swollen. She still has the small area in the midfoot She has lymphedema in the upper leg the area on the mid tibia still requiring debridement but looks better. 12/22; single wound in the area over the second met head. Still with a purulent drainage although less so. The entire foot looks less erythematous and less swollen. But both are still present. The area on the midfoot is smaller. We have been using silver alginate to both of these areas -The area on the anterior tibial area looks healthier. We are using Iodoflex here under compression She still does not have an appointment with Dr. Sharol Given. This is in spite of the fact that it was suggested she get one when she left the ER and that we have been working on this for 2 weeks. Will call his office tomorrow 12/29; the patient is seen Dr. Sharol Given tomorrow about the underlying osteomyelitis involving the fifth metatarsal and proximal phalanx of the fifth toe as well as the abscess in the plantar foot. On MRI. I have had her on a fairly prolonged course of cefdinir 300 twice daily. The abscess in the right plantar forefoot distal to the metatarsal heads. She still has 2 probing wounds in this area. At one point she had an area over the fifth metatarsal head that connected with an area on the midfoot. Both of these areas are closed. There is a lot less swelling and erythema in the forefoot. The area on the right anterior mid tibia looks a lot better. We  have been using Iodoflex on this area. 05/15/2019. Patient saw Dr. Sharol Given yesterday. The MRI.  He did not think that the edema within the medial cuneiform was related to osteomyelitis rather more than a stress reaction. The edema at the MTP joint of the little toe appeared to be more consistent with chronic osteo per his note. He wanted a watchful waiting approach. Noted that she is not on doxycycline but she is on cefdinir. The patient's foot continues to look better. There is much less erythema and forefoot edema. At one point copious amounts of purulent drainage coming out of the small wound over her left third met head wound. Connections with the wound on the plantar foot were also noted. 1/12; the patient's wound on the right anterior lower leg which was in the setting of lymphedema is closed. She still has the draining areas just distal to her metatarsal heads. 1 of these looks as though they are about to close over. The larger one which is a tiny opening still probes towards her first toe. Wound in the middle of her foot is healed. With regards to the osteomyelitis and abscess I have had her on a 5-week course of cefdinir. She will complete this in 2 days I think antibiotics can stop at that point. She gave a concerning history of diarrhea last time although the history seems a lot less worrisome today to semiformed bowel movements a day 1/19; the patient has completed her cefdinir. The foot is considerably better than when she came in where I thought it was quite likely she would lose a good part of it if not to complete foot. The only open area is over the third metatarsal head superiorly and now has about 1.5 cm of undermining but it was difficult to measure. At 1 point this tract right around the plantar surface of her foot and over to the first metatarsal head. Objective Constitutional Patient is hypertensive.. Pulse regular and within target range for patient.Marland Kitchen Respirations regular,  non-labored and within target range.. Temperature is normal and within the target range for the patient.Marland Kitchen Appears in no distress. Vitals Time Taken: 3:58 PM, Height: 68 in, Weight: 137 lbs, BMI: 20.8, Temperature: 98.7 F, Pulse: 109 bpm, Respiratory Rate: 18 breaths/min, Blood Pressure: 145/100 mmHg. General Notes: Wound exam; she has resolution of the forefoot swelling and erythema. Still the single wound I think just above her third metatarsal head has some depth and some undermining. However the orifice of this was exceptionally small. Using a #3 curette I removed skin and subcutaneous tissue from around the wound margin to give [place] better access. No evidence of surrounding infection ooThe area on the right anterior mid tibia is closed Integumentary (Hair, Skin) Wound #4 status is Open. Original cause of wound was Gradually Appeared. The wound is located on the [place]. The wound measures 0.2cm length x 0.2cm width x 0.8cm depth; 0.031cm^2 area and 0.025cm^3 volume. There is Fat Layer (Subcutaneous Tissue) Exposed exposed. There is no undermining noted, however, there is tunneling at 3:00 with a maximum distance of 1.5cm. There is a small amount of serous drainage noted. The wound margin is well defined and not attached to the wound base. There is large (67-100%) pink granulation within the wound bed. There is no necrotic tissue within the wound bed. 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 There was a Excisional Skin/Subcutaneous Tissue Debridement with a total area of 0.04 sq cm performed by Ricard Dillon., MD. With the following instrument(s): Curette to remove Viable and Non-Viable tissue/material. Material removed includes Subcutaneous Tissue, Skin: Dermis, and Skin: Epidermis. No specimens were taken. A time out was conducted at 16:14, prior to the start of the procedure. A Minimum amount of bleeding was controlled with Pressure. The procedure was tolerated well with a pain level of 0 throughout and a pain level of 0 following the procedure. Post Debridement Measurements: 0.4cm length x 0.3cm width x 0.8cm depth; 0.075cm^3 volume. Character of Wound/Ulcer Post Debridement is improved. Severity of Tissue Post Debridement is: Fat layer exposed. Post procedure Diagnosis Wound #4: Same as Pre-Procedure Pre-procedure diagnosis of Wound #4 is a Diabetic Wound/Ulcer of the Lower Extremity located on the Right,Distal,Plantar Foot . There was a Three Layer Compression Therapy Procedure by Carlene Coria, RN. Post procedure Diagnosis Wound #4: Same as Pre-Procedure Plan Follow-up Appointments: Return Appointment in 1 week. Dressing Change Frequency: Do not change entire dressing for one week. Wound Cleansing: Clean wound with Normal Saline. May shower and wash wound with soap and water. Primary Wound Dressing: Wound #4 Right,Distal,Plantar Foot: Iodoflex Secondary Dressing: Dry Gauze ABD pad Drawtex Edema Control: 3 Layer Compression System - Right Lower Extremity Avoid standing for long periods of time Elevate legs to the level of the heart or above for 30 minutes daily and/or when sitting, a frequency of: - 3 to 4 times a day throughout the day. Off-Loading: Open toe surgical shoe to: - on right foot. 1. I used Iodoflex to pack this to see if we can pull the edges of this  together somewhat. 2. There is no other open wound. The area on the midfoot on the right is closed also the first metatarsal head on the right is closed. Finally the area on the right mid tibia which was a separate wound having more to do with lymphedema is also closed 3. We can take her off antibiotics at this point. She tells me she completed the last week. This should be 5 weeks of Cefdinir Electronic Signature(s) Signed: 05/29/2019 5:52:07 PM By: Linton Ham MD Entered By: Linton Ham on 05/29/2019 16:24:57 -------------------------------------------------------------------------------- SuperBill Details Patient Name: Date of Service: Lindsey Murillo 05/29/2019 Medical Record [id number] Patient Account Number: [id number] Date of Birth/Sex: Treating RN: Dec 07, 1995 (24 y.o. F) Primary Care Provider: Baltazar Apo Other Clinician: Referring Provider: Treating Provider/Extender:Therman Hughlett, Evlyn Kanner, [place] Dom in Treatment: 10 Diagnosis Coding ICD-10 Codes Code Description E10.621 Type 1 diabetes mellitus with foot ulcer M86.471 Chronic osteomyelitis with draining sinus, right ankle and foot E10.42 Type 1 diabetes mellitus with diabetic polyneuropathy L97.518 Non-pressure chronic ulcer of other part of right foot with other specified severity L97.512 Non-pressure chronic ulcer of other part of right foot with fat layer exposed I89.0 Lymphedema, not elsewhere classified Facility Procedures CPT4 Code Description: [id number] 11042 - DEB SUBQ TISSUE 20 SQ CM/< ICD-10 Diagnosis Description L97.518 Non-pressure chronic ulcer of other part of right foot wit Modifier: h other specifi Quantity: 1 ed severity Physician Procedures CPT4 Code Description: [id number] 11042 - WC PHYS SUBQ TISS 20 SQ CM ICD-10 Diagnosis Description L97.518 Non-pressure chronic ulcer of other part of right foot  wit Modifier: h other specifi Quantity: 1 ed severity Electronic  Signature(s) Signed: 05/29/2019 5:52:07 PM By: Linton Ham MD Entered By: Linton Ham on 05/29/2019 16:25:12

## 2019-06-04 NOTE — Progress Notes (Signed)
Lindsey Murillo, Lindsey Murillo ([id number]) Visit Report for 05/29/2019 Arrival Information Details Patient Name: Date of Service: Lindsey Murillo, Lindsey Murillo 05/29/2019 3:45 PM Medical Record [id number] Patient Account Number: [id number] Date of Birth/Sex: Treating RN: 01-07-1996 (24 y.o. Nancy Fetter Primary Care Nesha Counihan: Baltazar Apo Other Clinician: Referring Michale Weikel: Treating Nafeesa Dils/Extender:Robson, Evlyn Kanner, [place] Dom in Treatment: 10 Visit Information History Since Last Visit Added or deleted any medications: No Patient Arrived: Ambulatory Any new allergies or adverse reactions: No Arrival Time: 15:58 Had a fall or experienced change in No Accompanied By: alone activities of daily living that may affect Transfer Assistance: None risk of falls: Patient Identification Verified: Yes Signs or symptoms of abuse/neglect since last No Secondary Verification Process Completed: Yes visito Patient Requires Transmission-Based No Hospitalized since last visit: No Precautions: Implantable device outside of the clinic excluding No Patient Has Alerts: No cellular tissue based products placed in the center since last visit: Has Dressing in Place as Prescribed: Yes Has Compression in Place as Prescribed: Yes Pain Present Now: No Electronic Signature(s) Signed: 06/04/2019 6:46:58 PM By: Levan Hurst RN, BSN Entered By: Levan Hurst on 05/29/2019 15:59:23 -------------------------------------------------------------------------------- Compression Therapy Details Patient Name: Date of Service: Lindsey Murillo 05/29/2019 3:45 PM Medical Record [id number] Patient Account Number: [id number] Date of Birth/Sex: Treating RN: 1995-06-17 (24 y.o. Orvan Falconer Primary Care Manley Fason: Baltazar Apo Other Clinician: Referring Montavius Subramaniam: Treating Dynasti Kerman/Extender:Robson, Evlyn Kanner, [place] Dom in Treatment: 10 Compression Therapy Performed for Wound Wound  #4 Right,Distal,Plantar Foot Assessment: Performed By: Jake Church, RN Compression Type: Three Layer Post Procedure Diagnosis Same as Pre-procedure Electronic Signature(s) Signed: 05/30/2019 5:42:11 PM By: Carlene Coria RN Entered By: Carlene Coria on 05/29/2019 16:19:37 -------------------------------------------------------------------------------- Encounter Discharge Information Details Patient Name: Date of Service: Lindsey Murillo. 05/29/2019 3:45 PM Medical Record [id number] Patient Account Number: [id number] Date of Birth/Sex: Treating RN: April 30, 1996 (24 y.o. Clearnce Sorrel Primary Care Skyelyn Scruggs: Baltazar Apo Other Clinician: Referring Kristiane Morsch: Treating Breionna Punt/Extender:Robson, Evlyn Kanner, [place] Dom in Treatment: 10 Encounter Discharge Information Items Post Procedure Vitals Discharge Condition: Stable Temperature (F): 98.7 Ambulatory Status: Ambulatory Pulse (bpm): 109 Discharge Destination: Home Respiratory Rate (breaths/min): 18 Transportation: Private Auto Blood Pressure (mmHg): 145/100 Accompanied By: self Schedule Follow-up Appointment: Yes Clinical Summary of Care: Patient Declined Electronic Signature(s) Signed: 05/29/2019 5:45:51 PM By: Kela Millin Entered By: Kela Millin on 05/29/2019 16:37:39 -------------------------------------------------------------------------------- Lower Extremity Assessment Details Patient Name: Date of Service: Lindsey Murillo, Lindsey Murillo 05/29/2019 3:45 PM Medical Record [id number] Patient Account Number: [id number] Date of Birth/Sex: Treating RN: 02/26/1996 (24 y.o. Nancy Fetter Primary Care Russell Engelstad: Baltazar Apo Other Clinician: Referring Stpehen Petitjean: Treating Armel Rabbani/Extender:Robson, Evlyn Kanner, [place] Dom in Treatment: 10 Edema Assessment Assessed: [Left: No] [Right: No] Edema: [Left: Ye] [Right: s] Calf Left: Right: Point of Measurement: cm From Medial  Instep cm 29.8 cm Ankle Left: Right: Point of Measurement: cm From Medial Instep cm 19.5 cm Vascular Assessment Pulses: Dorsalis Pedis Palpable: [Right:Yes] Electronic Signature(s) Signed: 06/04/2019 6:46:58 PM By: Levan Hurst RN, BSN Entered By: Levan Hurst on 05/29/2019 16:09:17 -------------------------------------------------------------------------------- Multi Wound Chart Details Patient Name: Date of Service: Lindsey Murillo 05/29/2019 3:45 PM Medical Record [id number] Patient Account Number: [id number] Date of Birth/Sex: Treating RN: 1995-06-27 (24 y.o. 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] Comorbid History: [4:Anemia, Type I Diabetes N/A] Date Acquired: [4:01/09/2019] [N/A:N/A] Weeks of Treatment: [4:10] [N/A:N/A] Wound Status: [4:Open] [N/A:N/A] Measurements L x W x D 0.2x0.2x0.8 [N/A:N/A] (cm) Area (cm) : [4:0.031] [N/A:N/A] Volume (cm) : [4:0.025] [N/A:N/A] % Reduction in Area: [4:99.40%] [N/A:N/A] % Reduction in Volume: 97.70% [N/A:N/A] Position 1 (o'clock): 3 Maximum Distance 1 [4:1.5] (cm): Tunneling: [4:Yes] [N/A:N/A] Classification: [4:Grade 3] [N/A:N/A] Exudate Amount: [4:Small] [N/A:N/A] Exudate Type: [4:Serous] [N/A:N/A] Exudate Color: [4:amber] [N/A:N/A] Wound Margin: [4:Well defined, not attached] [N/A:N/A] Granulation Amount: [4:Large (67-100%)] [N/A:N/A] Granulation Quality: [4:Pink] [N/A:N/A] Necrotic Amount: [4:None Present  (0%)] [N/A:N/A] Exposed Structures: [4:Fat Layer (Subcutaneous Tissue) Exposed: Yes Fascia: No Tendon: No Muscle: No Joint: No Bone: No] [N/A:N/A] Epithelialization: [4:Small (1-33%)] [N/A:N/A] Debridement: [4:Debridement - Excisional] [N/A:N/A] Pre-procedure [4:16:14] [N/A:N/A] Verification/Time Out Taken: Tissue Debrided: [4:Subcutaneous] [N/A:N/A] Level: [4:Skin/Subcutaneous Tissue] [N/A:N/A] Debridement Area (sq cm):0.04 [N/A:N/A] Instrument: [4:Curette] [N/A:N/A] Bleeding: [4:Minimum] [N/A:N/A] Hemostasis Achieved: [4:Pressure] [N/A:N/A] Procedural Pain: [4:0] [N/A:N/A] Post Procedural Pain: [4:0] [N/A:N/A] Debridement Treatment Procedure was tolerated [N/A:N/A] Response: [4:well] Post Debridement [4:0.4x0.3x0.8] [N/A:N/A] Measurements L x W x D (cm) Post Debridement [4:0.075] [N/A:N/A] Volume: (cm) Procedures Performed: Compression Therapy [4:Debridement] [N/A:N/A] Treatment Notes Electronic Signature(s) Signed: 05/29/2019 5:52:07 PM By: Linton Ham MD Entered By: Linton Ham on 05/29/2019 16:20:58 -------------------------------------------------------------------------------- Multi-Disciplinary Care Plan Details Patient Name: Date of Service: Lindsey Murillo. 05/29/2019 3:45 PM Medical Record [id number] Patient Account Number: [id number] Date of Birth/Sex: Treating RN: 09/10/1995 (25 y.o. Orvan Falconer Primary Care Kagan Mutchler: Baltazar Apo Other Clinician: Referring Katilin Raynes: Treating Shenea Giacobbe/Extender:Robson, Evlyn Kanner, [place] Dom in Treatment: 10 Active Inactive Wound/Skin Impairment Nursing Diagnoses: Knowledge deficit related to ulceration/compromised skin integrity Goals: Patient/caregiver will verbalize understanding of skin care regimen Date Initiated: 03/20/2019 Target Resolution Date: 06/29/2019 Goal Status: Active Ulcer/skin breakdown will have a volume reduction of 30% by week 4 Target Resolution Date Initiated:  03/20/2019 Date Inactivated: 04/24/2019 Date: 04/20/2019 Unmet Goal Status: Unmet Reason: COMORBITIES/ NON COMPLAINCE Ulcer/skin breakdown will have a volume reduction of 50% by week 8 Date Initiated: 04/24/2019 Date Inactivated: 05/29/2019 Target Resolution Date: 05/25/2019 Goal Status: Met Ulcer/skin breakdown will have a volume reduction of 80% by week 12 Date Initiated: 05/29/2019 Target Resolution Date: 06/29/2019 Goal Status: Active Interventions: Assess patient/caregiver ability to obtain necessary supplies Assess patient/caregiver ability to perform ulcer/skin care regimen upon admission and as needed Assess ulceration(s) every visit Notes: Electronic Signature(s) Signed: 05/30/2019 5:42:11 PM By: Carlene Coria RN Entered By: Carlene Coria on 05/29/2019 16:15:57 -------------------------------------------------------------------------------- Pain Assessment Details Patient Name: Date of Service: Lindsey Murillo, Lindsey Murillo 05/29/2019 3:45 PM Medical Record [id number] Patient Account Number: [id number] Date of Birth/Sex: Treating RN: [birth year] (24 y.o. Nancy Fetter Primary Care Haruye Lainez: Baltazar Apo Other Clinician: Referring Ashleyann Shoun: Treating Asuka Dusseau/Extender:Robson, Evlyn Kanner, [place] Dom in Treatment: 10 Active Problems Location of Pain Severity and Description of Pain Patient Has Paino No Patient Has Paino No Site Locations Pain Management and Medication Current Pain Management: Electronic Signature(s) Signed: 06/04/2019 6:46:58 PM By: Levan Hurst RN, BSN Entered By: Levan Hurst on 05/29/2019 16:01:29 -------------------------------------------------------------------------------- Patient/Caregiver Education Details Murillo, Lindsey 1/19/2021andnbsp3:45 Patient Name: Date of Service: [name] Medical Record Patient Account Number: [id number] Number: Treating RN: Carlene Coria Date of Birth/Gender: Aug 13, 1995 (23 y.o. F) Other  Clinician: Primary Care Physician: Laverta Baltimore Referring Physician: Physician/Extender: Marylin Crosby in Treatment: 10 Education Assessment Education Provided To: Patient Education Topics Provided Wound/Skin Impairment: Methods: Explain/Verbal  Responses: State content correctly Electronic Signature(s) Signed: 05/30/2019 5:42:11 PM By: Carlene Coria RN Entered By: Carlene Coria on 05/29/2019 16:16:10 -------------------------------------------------------------------------------- Wound Assessment Details Patient Name: Date of Service: Lindsey Murillo, Lindsey Murillo 05/29/2019 3:45 PM Medical Record [id number] Patient Account Number: [id number] Date of Birth/Sex: Treating RN: Jul 16, 1995 (24 y.o. Nancy Fetter Primary Care Lorice Lafave: Baltazar Apo Other Clinician: Referring Yazhini Mcaulay: Treating Shandra Szymborski/Extender:Robson, Evlyn Kanner, [place] Dom in Treatment: 10 Wound Status Wound Number: 4 Primary Diabetic Wound/Ulcer of the Lower Etiology: Extremity Wound Location: Right Foot - Plantar, Distal Wound Status: Open Wounding Event: Gradually Appeared Comorbid Anemia, Type I Diabetes Date Acquired: 01/09/2019 History: Weeks Of Treatment: 10 Clustered Wound: No Photos Wound Measurements Length: (cm) 0.2 % Reduction in Width: (cm) 0.2 % Reduction in Depth: (cm) 0.8 Epithelializat Area: (cm) 0.031 Tunneling: Volume: (cm) 0.025 Position ( Maximum Dis Area: 99.4% Volume: 97.7% ion: Small (1-33%) Yes o'clock): 3 tance: (cm) 1.5 Undermining: No Wound Description Classification: Grade 3 Foul Odor Afte Wound Margin: Well defined, not attached Slough/Fibrino Exudate Amount: Small Exudate Type: Serous Exudate Color: amber Wound Bed Granulation Amount: Large (67-100%) Granulation Quality: Pink Fascia Exposed Necrotic Amount: None Present (0%) Fat Layer (Sub Tendon Exposed Muscle Exposed Joint Exposed: Bone Exposed: Treatment  Notes Wound #4 (Right, Distal, Plantar Foot) 1. Cleanse With Wound Cleanser Soap and water 2. Periwound Care Moisturizing lotion 3. Primary Dressing Applied Iodoflex 4. Secondary Dressing Dry Gauze 6. Support Layer Applied 3 layer compression wrap r Cleansing: No No Exposed Structure : No cutaneous Tissue) Exposed: Yes : No : No No No Electronic Signature(s) Signed: 05/30/2019 4:44:29 PM By: Mikeal Hawthorne EMT/HBOT Signed: 06/04/2019 6:46:58 PM By: Levan Hurst RN, BSN Entered By: Mikeal Hawthorne on 05/30/2019 15:01:29 -------------------------------------------------------------------------------- [place] Details Patient Name: Date of Service: Lindsey Murillo. 05/29/2019 3:45 PM Medical Record [id number] Patient Account Number: [id number] Date of Birth/Sex: Treating RN: July 05, 1995 (24 y.o. Nancy Fetter Primary Care Tawyna Pellot: Baltazar Apo Other Clinician: Referring Riann Oman: Treating Deserie Dirks/Extender:Robson, Evlyn Kanner, [place] Dom in Treatment: 10 Vital Signs Time Taken: 15:58 Temperature (F): 98.7 Height (in): 68 Pulse (bpm): 109 Weight (lbs): 137 Respiratory Rate (breaths/min): 18 Body Mass Index (BMI): 20.8 Blood Pressure (mmHg): 145/100 Reference Range: 80 - 120 mg / dl Electronic Signature(s) Signed: 06/04/2019 6:46:58 PM By: Levan Hurst RN, BSN Entered By: Levan Hurst on 05/29/2019 16:01:22

## 2019-06-05 ENCOUNTER — Encounter (HOSPITAL_BASED_OUTPATIENT_CLINIC_OR_DEPARTMENT_OTHER): Payer: 59 | Admitting: Internal Medicine

## 2019-06-05 ENCOUNTER — Other Ambulatory Visit: Payer: Self-pay

## 2019-06-05 DIAGNOSIS — A419 Sepsis, unspecified organism: Secondary | ICD-10-CM | POA: Diagnosis not present

## 2019-06-05 DIAGNOSIS — M869 Osteomyelitis, unspecified: Secondary | ICD-10-CM | POA: Diagnosis not present

## 2019-06-06 ENCOUNTER — Other Ambulatory Visit: Payer: Self-pay

## 2019-06-06 ENCOUNTER — Emergency Department (HOSPITAL_COMMUNITY): Payer: 59

## 2019-06-06 ENCOUNTER — Encounter: Payer: Self-pay | Admitting: Family Medicine

## 2019-06-06 ENCOUNTER — Inpatient Hospital Stay (HOSPITAL_COMMUNITY)
Admission: EM | Admit: 2019-06-06 | Discharge: 2019-06-12 | DRG: 872 | Disposition: A | Discharge status: Home Health | Payer: 59 | Attending: Internal Medicine | Admitting: Internal Medicine

## 2019-06-06 ENCOUNTER — Encounter (HOSPITAL_COMMUNITY): Payer: Self-pay | Admitting: *Deleted

## 2019-06-06 DIAGNOSIS — Z833 Family history of diabetes mellitus: Secondary | ICD-10-CM

## 2019-06-06 DIAGNOSIS — E559 Vitamin D deficiency, unspecified: Secondary | ICD-10-CM | POA: Diagnosis present

## 2019-06-06 DIAGNOSIS — I10 Essential (primary) hypertension: Secondary | ICD-10-CM | POA: Diagnosis present

## 2019-06-06 DIAGNOSIS — Z79899 Other long term (current) drug therapy: Secondary | ICD-10-CM

## 2019-06-06 DIAGNOSIS — E10649 Type 1 diabetes mellitus with hypoglycemia without coma: Secondary | ICD-10-CM | POA: Diagnosis present

## 2019-06-06 DIAGNOSIS — M4628 Osteomyelitis of vertebra, sacral and sacrococcygeal region: Secondary | ICD-10-CM | POA: Diagnosis present

## 2019-06-06 DIAGNOSIS — Z794 Long term (current) use of insulin: Secondary | ICD-10-CM

## 2019-06-06 DIAGNOSIS — E1069 Type 1 diabetes mellitus with other specified complication: Secondary | ICD-10-CM | POA: Diagnosis present

## 2019-06-06 DIAGNOSIS — L03319 Cellulitis of trunk, unspecified: Secondary | ICD-10-CM

## 2019-06-06 DIAGNOSIS — A419 Sepsis, unspecified organism: Principal | ICD-10-CM | POA: Diagnosis present

## 2019-06-06 DIAGNOSIS — Z91048 Other nonmedicinal substance allergy status: Secondary | ICD-10-CM

## 2019-06-06 DIAGNOSIS — L89152 Pressure ulcer of sacral region, stage 2: Secondary | ICD-10-CM | POA: Diagnosis present

## 2019-06-06 DIAGNOSIS — E1043 Type 1 diabetes mellitus with diabetic autonomic (poly)neuropathy: Secondary | ICD-10-CM | POA: Diagnosis present

## 2019-06-06 DIAGNOSIS — E109 Type 1 diabetes mellitus without complications: Secondary | ICD-10-CM | POA: Diagnosis present

## 2019-06-06 DIAGNOSIS — R000 Tachycardia, unspecified: Secondary | ICD-10-CM | POA: Diagnosis present

## 2019-06-06 DIAGNOSIS — E1142 Type 2 diabetes mellitus with diabetic polyneuropathy: Secondary | ICD-10-CM | POA: Diagnosis present

## 2019-06-06 DIAGNOSIS — L0390 Cellulitis, unspecified: Secondary | ICD-10-CM

## 2019-06-06 DIAGNOSIS — L8990 Pressure ulcer of unspecified site, unspecified stage: Secondary | ICD-10-CM | POA: Insufficient documentation

## 2019-06-06 DIAGNOSIS — E063 Autoimmune thyroiditis: Secondary | ICD-10-CM | POA: Diagnosis present

## 2019-06-06 DIAGNOSIS — M609 Myositis, unspecified: Secondary | ICD-10-CM | POA: Diagnosis present

## 2019-06-06 DIAGNOSIS — J45909 Unspecified asthma, uncomplicated: Secondary | ICD-10-CM | POA: Diagnosis present

## 2019-06-06 DIAGNOSIS — Z20822 Contact with and (suspected) exposure to covid-19: Secondary | ICD-10-CM | POA: Diagnosis present

## 2019-06-06 DIAGNOSIS — L03818 Cellulitis of other sites: Secondary | ICD-10-CM | POA: Diagnosis present

## 2019-06-06 DIAGNOSIS — D638 Anemia in other chronic diseases classified elsewhere: Secondary | ICD-10-CM | POA: Diagnosis present

## 2019-06-06 LAB — CBC
HCT: 26.6 % — ABNORMAL LOW (ref 36.0–46.0)
Hemoglobin: 8.5 g/dL — ABNORMAL LOW (ref 12.0–15.0)
MCH: 25.9 pg — ABNORMAL LOW (ref 26.0–34.0)
MCHC: 32 g/dL (ref 30.0–36.0)
MCV: 81.1 fL (ref 80.0–100.0)
Platelets: 342 10*3/uL (ref 150–400)
RBC: 3.28 MIL/uL — ABNORMAL LOW (ref 3.87–5.11)
RDW: 13.9 % (ref 11.5–15.5)
WBC: 20.9 10*3/uL — ABNORMAL HIGH (ref 4.0–10.5)
nRBC: 0 % (ref 0.0–0.2)

## 2019-06-06 MED ORDER — SODIUM CHLORIDE 0.9 % IV BOLUS
1000.0000 mL | Freq: Once | INTRAVENOUS | Status: AC
  Administered 2019-06-06: 1000 mL via INTRAVENOUS

## 2019-06-06 MED ORDER — SODIUM CHLORIDE 0.9 % IV SOLN
2.0000 g | Freq: Once | INTRAVENOUS | Status: AC
  Administered 2019-06-06: 2 g via INTRAVENOUS
  Filled 2019-06-06: qty 20

## 2019-06-06 MED ORDER — ACETAMINOPHEN 500 MG PO TABS
1000.0000 mg | ORAL_TABLET | Freq: Once | ORAL | Status: AC
  Administered 2019-06-06: 1000 mg via ORAL
  Filled 2019-06-06: qty 2

## 2019-06-06 MED ORDER — VANCOMYCIN HCL IN DEXTROSE 1-5 GM/200ML-% IV SOLN
1000.0000 mg | Freq: Once | INTRAVENOUS | Status: AC
  Administered 2019-06-06: 1000 mg via INTRAVENOUS
  Filled 2019-06-06: qty 200

## 2019-06-06 NOTE — ED Triage Notes (Signed)
Pt c/o lower back pain x 2 days; pt denies any urinary sx and states it is very painful to sit

## 2019-06-06 NOTE — Progress Notes (Signed)
AUTUMM, HATTERY ([id number]) Visit Report for 06/05/2019 HPI Details Patient Name: Date of Service: Lindsey Murillo, EUBANKS 06/05/2019 3:45 PM Medical Record [id number] Patient Account Number: [id number] Date of Birth/Sex: Treating RN: 11-29-1995 (24 y.o. F) Primary Care Provider: Baltazar Apo Other Clinician: Referring Provider: Treating Provider/Extender:Helix Lafontaine, Evlyn Kanner, [place] Dom in Treatment: 11 History of Present Illness HPI Description: 06/16/2018 ADMISSION This is a 24 year old type I diabetic patient. She was referred by her family physician Dr. Wolfgang Phoenix in [place] where they live. She had been seen in the urgent care at [facility] on 05/27/2018 with a large bulla on the right dorsal foot. An x-ray of the foot showed swelling of the base of her first toe and first MTP with no soft tissue gas or bony erosion. She came to the primary physician's attention in follow-up on 06/09/2018 noted to have a diabetic foot ulcer. She was advised to take work off. She has been using peroxide and Neosporin to the wound. Past medical history; the patient does not have a prior history of diabetic foot ulcers. She is listed as having both peripheral neuropathy and autonomic neuropathy. Chronic diarrhea thoracic back pain and anemia. ABIs in our clinic were noncompressible on the right 2/11; follow-up for this badly infected wound that we admitted to the clinic on 2/7. The area in question is on the right first metatarsal head. When she arrived in clinic last week there was swelling and erythema in the foot as well as on the lateral part of the first metatarsal head. Plain x-rays showed soft tissue wound with soft tissue gas noted overlying the first MTP no radiographic changes of osteomyelitis. The culture showed abundant group B strep. The Augmentin that I chose empirically last time clearly the right choice. She is offloading this in a healing sandal. She is not working. She  states her diabetes is under reasonably good control 2/14; continued follow-up. The area in question which is on the right first metatarsal head looks improved although still requiring debridement were using silver alginate. She is completing 10 days of Augmentin which she has tolerated well. According to the patient she is offloading this quite aggressively. She is still in a surgical shoe 2/21; continued follow-up. Right first metatarsal head generally improved but still requiring debridement there is less swelling and no erythema. She is offloading this in a surgical shoe. 2/28; right first metatarsal head generally improved but still requiring debridement we have been using Hydrofera Blue she is using a surgical shoe but I changed her to a Darco forefoot off loader today. Possible she will ultimately require a total contact cast 3/6 right first metatarsal head generally improved there is less adherent debris and generally washes off nicely with Anasept and gauze. Using Hydrofera Blue and a Darco. We are going to consider putting her in a total contact cast today however we did not have sufficient supplies 3/13; our intake nurse reported serosanguineous drainage which was concerning. The patient denies any systemic symptoms we have been using Hydrofera Blue in today with anticipation of putting on a total contact cast. However we also noted swelling around the wound. 3/20 -Right first metatarsal head wound appears granulating, without areas of necrosis or debris surrounding skin appears good, she is completed or almost completed her Keflex course. We will continue with the alginate and she is doing the offloading with the surgical shoe and felt. 3/27; right first metatarsal head. There is no evidence of infection currently. She has senescent looking wound edges and  some debris on the wound surface but in general things look quite a bit better today. We have been using alginate and offloading  her with a forefoot off loader. I am not going to consider total contact casting her in this environment until I can be certain with that the clinic will be open to deal with the obligatory first cast change in the routine changing 4/3; right first metatarsal head. No evidence of infection. The tissue looks healthy although there is some tunneling laterally towards the mid part of the foot. We are using silver alginate, I have changed that the silver collagen today. 4/9 right first metatarsal head. No evidence of infection. Using silver collagen. 4/17 right first metatarsal head the wound is actually larger today and I think deeper. There is no evidence of recurrent infection. 4/20; back for her obligatory first total contact cast change. The wound on her foot actually looks quite good on the right first metatarsal head however she clearly has a cast injury at the superior margin of the cast just at the folded level this was in spite of padding this area. 4/24; wound on the right first metatarsal head looks better. The cast injury wounds on the anterior tibial area also look better. 5/1 wound on the right first metatarsal head is closed over. The cost injury wounds on the anterior tibial are also closed. The patient has type 1 diabetes with polyneuropathy. I do not think she has the financial means for diabetic shoes with custom inserts READMISSION 03/20/2019 This is a patient with type 1 diabetes and polyneuropathy. She was seen earlier this year with a wound on the right first metatarsal head the closed over. We recommended that she offload this in her shoes. I do not believe that was done but in any case her wounds reopened sometime in October. She was seen in the ER on 10/20 with a diabetic foot ulcer. Right foot was swollen and erythematous. She was put on antibiotics although at the time of this dictation I am not exactly sure which one. An x-ray was done that showed no osteo-. She has  been soaking her foot in Epson salts and applying dry gauze. She is still working at [employer]. She has 4 open areas; one at the base of the second through fourth toes. One at the first met head 1 over the second met head and 1 over the midfoot. The second met head wound probes proximally towards her midfoot and midfoot wound probes superiorly although I could not connect these 2 wounds. There is no purulent drainage. A considerable amount of denuded skin over this area which I think is a remanent of cellulitis although the medial part of her foot is still erythematous there is no tenderness. For the most part I think this looks like treated cellulitis from her antibiotics in October. She has a superficial area of epithelial loss on the right lower mid tibia Past medical history; patient is a type I diabetic with polyneuropathy. She is not felt to have PAD.. She is unaware of her hemoglobin A1c she is supposed to see an endocrinologist this afternoon. 11/19; MRI is tomorrow. Culture I did last time showed group G strep. This would have been covered by the empiric Augmentin I gave her when I first saw her. She should have 3 more days of this I am going to extend this by another 4 days until we have time to digest the MRI. If she has osteomyelitis she is going to   need to see infectious disease for IV antibiotics. She has not been systemically unwell. She is tolerating the Augmentin well 12/1; the patient comes in today stating that her MRI that was supposed to be done on 11/20 was canceled by the hospital. She does not have an appointment. She has completed the Augmentin I gave her for the culture showing group G strep. Paradoxically she has less of an open area on the proximal foot just above her plantar metatarsals the midfoot also seems to be probing less and the left met head is closed. She also has a fairly extensive area on the right mid tibia in the setting of what looks to be lymphedema.  According to our records this was present on admission but I do not think I looked at this specifically the last time she was here. 12/8; I finally had to send this patient to the ER in order to get an MRI performed. The MRI showed findings consistent with osteomyelitis in the head and neck of the fifth metatarsal and the base and proximal phalanx of the fifth toe. There is also an abscess in the subcutaneous tissues dorsal to the fifth MTP and proximal phalanx of the little toe. Also noted that there is an abscess in the plantar soft tissues of the foot just proximal to the toes which extends from the great toe to the fourth toe. They did do an MRI of the tib-fib which was negative for underlying bone issues. Culture I did last week showed abundant Serratia and moderate group B strep They did not admit her last week from the ER which to me was surprising. She has purulent drainage coming out of the small wound at roughly the third met head. Erythema and swelling of the forefoot. She continues to have a large wound on the anterior mid tibia on the right 12/15; the patient still has the 2 small open areas over the second met head. Still draining purulent drainage although the entire foot looks less erythematous and less swollen. She still has the small area in the midfoot She has lymphedema in the upper leg the area on the mid tibia still requiring debridement but looks better. 12/22; single wound in the area over the second met head. Still with a purulent drainage although less so. The entire foot looks less erythematous and less swollen. But both are still present. The area on the midfoot is smaller. We have been using silver alginate to both of these areas -The area on the anterior tibial area looks healthier. We are using Iodoflex here under compression She still does not have an appointment with Dr. Sharol Given. This is in spite of the fact that it was suggested she get one when she left the ER and  that we have been working on this for 2 weeks. Will call his office tomorrow 12/29; the patient is seen Dr. Sharol Given tomorrow about the underlying osteomyelitis involving the fifth metatarsal and proximal phalanx of the fifth toe as well as the abscess in the plantar foot. On MRI. I have had her on a fairly prolonged course of cefdinir 300 twice daily. The abscess in the right plantar forefoot distal to the metatarsal heads. She still has 2 probing wounds in this area. At one point she had an area over the fifth metatarsal head that connected with an area on the midfoot. Both of these areas are closed. There is a lot less swelling and erythema in the forefoot. The area on the right anterior  mid tibia looks a lot better. We have been using Iodoflex on this area. 05/15/2019. Patient saw Dr. Sharol Given yesterday. The MRI. He did not think that the edema within the medial cuneiform was related to osteomyelitis rather more than a stress reaction. The edema at the MTP joint of the little toe appeared to be more consistent with chronic osteo per his note. He wanted a watchful waiting approach. Noted that she is not on doxycycline but she is on cefdinir. The patient's foot continues to look better. There is much less erythema and forefoot edema. At one point copious amounts of purulent drainage coming out of the small wound over her left third met head wound. Connections with the wound on the plantar foot were also noted. 1/12; the patient's wound on the right anterior lower leg which was in the setting of lymphedema is closed. She still has the draining areas just distal to her metatarsal heads. 1 of these looks as though they are about to close over. The larger one which is a tiny opening still probes towards her first toe. Wound in the middle of her foot is healed. With regards to the osteomyelitis and abscess I have had her on a 5-week course of cefdinir. She will complete this in 2 days I think antibiotics can  stop at that point. She gave a concerning history of diarrhea last time although the history seems a lot less worrisome today to semiformed bowel movements a day 1/19; the patient has completed her cefdinir. The foot is considerably better than when she came in where I thought it was quite likely she would lose a good part of it if not to complete foot. The only open area is over the third metatarsal head superiorly and now has about 1.5 cm of undermining but it was difficult to measure. At 1 point this tract right around the plantar surface of her foot and over to the first metatarsal head. 1/26; the patient's foot continues to improve. The 1 remaining wound she has over the third metatarsal head superiorly is unmeasurable and is really come in. We have been using Iodoflex. Electronic Signature(s) Signed: 06/05/2019 5:32:11 PM By: Linton Ham MD Entered By: Linton Ham on 06/05/2019 16:51:22 -------------------------------------------------------------------------------- Physical Exam Details Patient Name: Date of Service: WYOMA, Lindsey Murillo 06/05/2019 3:45 PM Medical Record [id number] Patient Account Number: [id number] Date of Birth/Sex: Treating RN: 1995-07-22 (24 y.o. F) Primary Care Provider: Baltazar Apo Other Clinician: Referring Provider: Treating Provider/Extender:Trevia Nop, Evlyn Kanner, [place] Dom in Treatment: 11 Constitutional Patient is hypertensive.. Pulse regular and within target range for patient.Marland Kitchen Respirations regular, non-labored and within target range.. Temperature is normal and within the target range for the patient.Marland Kitchen Appears in no distress. Notes Wound exam; she has resolution of forefoot swelling and erythema. Single wound area is smaller and I really cannot force any probable depth of this to assess undermining. No debridement was necessary. The area on her right leg anteriorly which was secondary to uncontrolled lymphedema is also a lot  better. Electronic Signature(s) Signed: 06/05/2019 5:32:11 PM By: Linton Ham MD Entered By: Linton Ham on 06/05/2019 16:54:03 -------------------------------------------------------------------------------- Physician Orders Details Patient Name: Date of Service: [name] 06/05/2019 3:45 PM Medical Record [id number] Patient Account Number: [id number] Date of Birth/Sex: Treating RN: [birth year] (24 y.o. 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 Dressing Change Frequency Do not change entire dressing for one week. Wound Cleansing Clean wound with Normal Saline. May shower and wash wound with soap and water. Primary Wound Dressing Wound #4 Right,Distal,Plantar Foot Iodoflex Secondary Dressing Dry Gauze ABD pad Drawtex Edema Control 3 Layer Compression System - Right Lower Extremity Avoid standing for long periods of time Elevate legs to the level of the heart or above for 30 minutes daily and/or when sitting, a frequency of: - 3 to 4 times a day throughout the day. Off-Loading Open toe surgical shoe to: - on right foot. Electronic Signature(s) Signed: 06/05/2019 5:32:11 PM By: Linton Ham MD Signed: 06/06/2019 7:19:07 AM By: Carlene Coria RN Entered By: Carlene Coria on 06/05/2019  16:07:12 -------------------------------------------------------------------------------- Problem List Details Patient Name: Date of Service: BRIANN, SARCHET 06/05/2019 3:45 PM Medical Record [id number] Patient Account Number: [id number] Date of Birth/Sex: Treating RN: 1996-03-07 (24 y.o. Orvan Falconer Primary Care Provider: Baltazar Apo Other Clinician: Referring Provider: Treating Provider/Extender:Cayleigh Paull, Evlyn Kanner, [place] Dom in Treatment: 11 Active Problems ICD-10 Evaluated Encounter Code Description Active Date Today Diagnosis E10.621 Type 1 diabetes mellitus with foot ulcer 03/20/2019 No Yes M86.471 Chronic osteomyelitis with draining sinus, right ankle 04/17/2019 No Yes and foot E10.42 Type 1 diabetes mellitus with diabetic polyneuropathy 03/20/2019 No Yes L97.518 Non-pressure chronic ulcer of other part of right foot 03/20/2019 No Yes with other specified severity L97.512 Non-pressure chronic ulcer of other part of right foot 03/20/2019 No Yes with fat layer exposed I89.0 Lymphedema, not elsewhere classified 03/20/2019 No Yes Inactive Problems ICD-10 Code Description Active Date Inactive Date L03.115 Cellulitis of right lower limb 03/20/2019 03/20/2019 L97.811 Non-pressure chronic ulcer of other part of right lower leg 03/20/2019 03/20/2019 limited to breakdown of skin Resolved Problems Electronic Signature(s) Signed: 06/05/2019 5:32:11 PM By: Linton Ham MD Entered By: Linton Ham on 06/05/2019 16:50:26 -------------------------------------------------------------------------------- Progress Note Details Patient Name: Date of Service: [name] 06/05/2019 3:45 PM Medical Record [id number] Patient Account Number: [id number] Date of Birth/Sex: Treating RN: 12-03-1995 (24 y.o. F) Primary Care Provider: Baltazar Apo Other Clinician: Referring Provider: Treating Provider/Extender:Amity Roes, Evlyn Kanner,  [place] Dom in Treatment: 11 Subjective History of Present Illness (HPI) 06/16/2018 ADMISSION This is a 24 year old type I diabetic patient. She was referred by her family physician Dr. Wolfgang Phoenix in [place] where they live. She had been seen in the urgent care at [facility] on 05/27/2018 with a large bulla on the right dorsal foot. An x-ray of the foot showed swelling of the base of her first toe and first MTP with no soft tissue gas or bony erosion. She came to the primary physician's attention in follow-up on 06/09/2018 noted to have a diabetic foot ulcer. She was advised to take work off. She has been using peroxide and Neosporin to the wound. Past medical history; the patient does not have a prior history of diabetic foot ulcers. She is listed as having both peripheral neuropathy and autonomic neuropathy. Chronic diarrhea thoracic back pain and anemia. ABIs in our clinic were noncompressible on the right 2/11; follow-up for this badly infected wound that we admitted to the clinic on 2/7. The  area in question is on the right first metatarsal head. When she arrived in clinic last week there was swelling and erythema in the foot as well as on the lateral part of the first metatarsal head. Plain x-rays showed soft tissue wound with soft tissue gas noted overlying the first MTP no radiographic changes of osteomyelitis. The culture showed abundant group B strep. The Augmentin that I chose empirically last time clearly the right choice. She is offloading this in a healing sandal. She is not working. She states her diabetes is under reasonably good control 2/14; continued follow-up. The area in question which is on the right first metatarsal head looks improved although still requiring debridement were using silver alginate. She is completing 10 days of Augmentin which she has tolerated well. According to the patient she is offloading this quite aggressively. She is still in a surgical shoe 2/21;  continued follow-up. Right first metatarsal head generally improved but still requiring debridement there is less swelling and no erythema. She is offloading this in a surgical shoe. 2/28; right first metatarsal head generally improved but still requiring debridement we have been using Hydrofera Blue she is using a surgical shoe but I changed her to a Darco forefoot off loader today. Possible she will ultimately require a total contact cast 3/6 right first metatarsal head generally improved there is less adherent debris and generally washes off nicely with Anasept and gauze. Using Hydrofera Blue and a Darco. We are going to consider putting her in a total contact cast today however we did not have sufficient supplies 3/13; our intake nurse reported serosanguineous drainage which was concerning. The patient denies any systemic symptoms we have been using Hydrofera Blue in today with anticipation of putting on a total contact cast. However we also noted swelling around the wound. 3/20 -Right first metatarsal head wound appears granulating, without areas of necrosis or debris surrounding skin appears good, she is completed or almost completed her Keflex course. We will continue with the alginate and she is doing the offloading with the surgical shoe and felt. 3/27; right first metatarsal head. There is no evidence of infection currently. She has senescent looking wound edges and some debris on the wound surface but in general things look quite a bit better today. We have been using alginate and offloading her with a forefoot off loader. I am not going to consider total contact casting her in this environment until I can be certain with that the clinic will be open to deal with the obligatory first cast change in the routine changing 4/3; right first metatarsal head. No evidence of infection. The tissue looks healthy although there is some tunneling laterally towards the mid part of the foot. We are  using silver alginate, I have changed that the silver collagen today. 4/9 right first metatarsal head. No evidence of infection. Using silver collagen. 4/17 right first metatarsal head the wound is actually larger today and I think deeper. There is no evidence of recurrent infection. 4/20; back for her obligatory first total contact cast change. The wound on her foot actually looks quite good on the right first metatarsal head however she clearly has a cast injury at the superior margin of the cast just at the folded level this was in spite of padding this area. 4/24; wound on the right first metatarsal head looks better. The cast injury wounds on the anterior tibial area also look better. 5/1 wound on the right first metatarsal head is closed over.  The cost injury wounds on the anterior tibial are also closed. The patient has type 1 diabetes with polyneuropathy. I do not think she has the financial means for diabetic shoes with custom inserts READMISSION 03/20/2019 This is a patient with type 1 diabetes and polyneuropathy. She was seen earlier this year with a wound on the right first metatarsal head the closed over. We recommended that she offload this in her shoes. I do not believe that was done but in any case her wounds reopened sometime in October. She was seen in the ER on 10/20 with a diabetic foot ulcer. Right foot was swollen and erythematous. She was put on antibiotics although at the time of this dictation I am not exactly sure which one. An x-ray was done that showed no osteo-. She has been soaking her foot in Epson salts and applying dry gauze. She is still working at [employer]. She has 4 open areas; one at the base of the second through fourth toes. One at the first met head 1 over the second met head and 1 over the midfoot. The second met head wound probes proximally towards her midfoot and midfoot wound probes superiorly although I could not connect these 2 wounds. There is no  purulent drainage. A considerable amount of denuded skin over this area which I think is a remanent of cellulitis although the medial part of her foot is still erythematous there is no tenderness. For the most part I think this looks like treated cellulitis from her antibiotics in October. She has a superficial area of epithelial loss on the right lower mid tibia Past medical history; patient is a type I diabetic with polyneuropathy. She is not felt to have PAD.Marland Kitchen She is unaware of her hemoglobin A1c she is supposed to see an endocrinologist this afternoon. 11/19; MRI is tomorrow. Culture I did last time showed group G strep. This would have been covered by the empiric Augmentin I gave her when I first saw her. She should have 3 more days of this I am going to extend this by another 4 days until we have time to digest the MRI. If she has osteomyelitis she is going to need to see infectious disease for IV antibiotics. She has not been systemically unwell. She is tolerating the Augmentin well 12/1; the patient comes in today stating that her MRI that was supposed to be done on 11/20 was canceled by the hospital. She does not have an appointment. She has completed the Augmentin I gave her for the culture showing group G strep. Paradoxically she has less of an open area on the proximal foot just above her plantar metatarsals the midfoot also seems to be probing less and the left met head is closed. She also has a fairly extensive area on the right mid tibia in the setting of what looks to be lymphedema. According to our records this was present on admission but I do not think I looked at this specifically the last time she was here. 12/8; I finally had to send this patient to the ER in order to get an MRI performed. The MRI showed findings consistent with osteomyelitis in the head and neck of the fifth metatarsal and the base and proximal phalanx of the fifth toe. There is also an abscess in the  subcutaneous tissues dorsal to the fifth MTP and proximal phalanx of the little toe. Also noted that there is an abscess in the plantar soft tissues of the foot just proximal  to the toes which extends from the great toe to the fourth toe. They did do an MRI of the tib-fib which was negative for underlying bone issues. Culture I did last week showed abundant Serratia and moderate group B strep They did not admit her last week from the ER which to me was surprising. She has purulent drainage coming out of the small wound at roughly the third met head. Erythema and swelling of the forefoot. She continues to have a large wound on the anterior mid tibia on the right 12/15; the patient still has the 2 small open areas over the second met head. Still draining purulent drainage although the entire foot looks less erythematous and less swollen. She still has the small area in the midfoot She has lymphedema in the upper leg the area on the mid tibia still requiring debridement but looks better. 12/22; single wound in the area over the second met head. Still with a purulent drainage although less so. The entire foot looks less erythematous and less swollen. But both are still present. The area on the midfoot is smaller. We have been using silver alginate to both of these areas -The area on the anterior tibial area looks healthier. We are using Iodoflex here under compression She still does not have an appointment with Dr. Sharol Given. This is in spite of the fact that it was suggested she get one when she left the ER and that we have been working on this for 2 weeks. Will call his office tomorrow 12/29; the patient is seen Dr. Sharol Given tomorrow about the underlying osteomyelitis involving the fifth metatarsal and proximal phalanx of the fifth toe as well as the abscess in the plantar foot. On MRI. I have had her on a fairly prolonged course of cefdinir 300 twice daily. The abscess in the right plantar forefoot distal to  the metatarsal heads. She still has 2 probing wounds in this area. At one point she had an area over the fifth metatarsal head that connected with an area on the midfoot. Both of these areas are closed. There is a lot less swelling and erythema in the forefoot. The area on the right anterior mid tibia looks a lot better. We have been using Iodoflex on this area. 05/15/2019. Patient saw Dr. Sharol Given yesterday. The MRI. He did not think that the edema within the medial cuneiform was related to osteomyelitis rather more than a stress reaction. The edema at the MTP joint of the little toe appeared to be more consistent with chronic osteo per his note. He wanted a watchful waiting approach. Noted that she is not on doxycycline but she is on cefdinir. The patient's foot continues to look better. There is much less erythema and forefoot edema. At one point copious amounts of purulent drainage coming out of the small wound over her left third met head wound. Connections with the wound on the plantar foot were also noted. 1/12; the patient's wound on the right anterior lower leg which was in the setting of lymphedema is closed. She still has the draining areas just distal to her metatarsal heads. 1 of these looks as though they are about to close over. The larger one which is a tiny opening still probes towards her first toe. Wound in the middle of her foot is healed. With regards to the osteomyelitis and abscess I have had her on a 5-week course of cefdinir. She will complete this in 2 days I think antibiotics can stop at  that point. She gave a concerning history of diarrhea last time although the history seems a lot less worrisome today to semiformed bowel movements a day 1/19; the patient has completed her cefdinir. The foot is considerably better than when she came in where I thought it was quite likely she would lose a good part of it if not to complete foot. The only open area is over the third metatarsal  head superiorly and now has about 1.5 cm of undermining but it was difficult to measure. At 1 point this tract right around the plantar surface of her foot and over to the first metatarsal head. 1/26; the patient's foot continues to improve. The 1 remaining wound she has over the third metatarsal head superiorly is unmeasurable and is really come in. We have been using Iodoflex. Objective Constitutional Patient is hypertensive.. Pulse regular and within target range for patient.Marland Kitchen Respirations regular, non-labored and within target range.. Temperature is normal and within the target range for the patient.Marland Kitchen Appears in no distress. Vitals Time Taken: 4:08 PM, Height: 68 in, Source: Stated, Weight: 137 lbs, Source: Stated, BMI: 20.8, Temperature: 98.6 F, Pulse: 124 bpm, Respiratory Rate: 18 breaths/min, Blood Pressure: 156/102 mmHg. General Notes: pt does not recall glucose reading from last night General Notes: Wound exam; she has resolution of forefoot swelling and erythema. Single wound area is smaller and I really cannot force any probable depth of this to assess undermining. No debridement was necessary. ooThe area on her right leg anteriorly which was secondary to uncontrolled lymphedema is also a lot better. Integumentary (Hair, Skin) Wound #4 status is Open. Original cause of wound was Gradually Appeared. The wound is located on the Lane. The wound measures 0.2cm length x 0.2cm width x 0.5cm depth; 0.031cm^2 area and 0.016cm^3 volume. There is Fat Layer (Subcutaneous Tissue) Exposed exposed. There is no tunneling or undermining noted. There is a small amount of sanguinous drainage noted. The wound margin is well defined and not attached to the wound base. There is no granulation within the wound bed. There is no necrotic tissue within the wound bed. 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 There was a Three Layer Compression Therapy Procedure by Carlene Coria, RN. Post procedure Diagnosis Wound #4: Same as Pre-Procedure Plan Follow-up Appointments: Return Appointment in 1 week. Dressing Change Frequency: Do not change entire dressing for one week. Wound Cleansing: Clean wound with Normal Saline. May shower and wash wound with soap and water. Primary Wound Dressing: Wound #4 Right,Distal,Plantar Foot: Iodoflex Secondary Dressing: Dry Gauze ABD pad Drawtex Edema Control: 3 Layer Compression System - Right Lower Extremity Avoid standing for long periods of time Elevate legs to the level of the heart or above for 30 minutes daily and/or when sitting, a frequency of: - 3 to 4 times a day throughout the day. Off-Loading: Open toe surgical shoe to: - on right foot. 1. I am continuing with Iodoflex to this wound which should result in closure 2. She had 6 weeks of empiric antibiotics that I  gave her myself which I believe was Levaquin and 2 weeks of Flagyl 3. She saw orthopedics who at that point felt the wound was stable and that she was improving. 4. This patient is certainly at risk for recurrence. Electronic Signature(s) Signed: 06/05/2019 5:32:11 PM By: Linton Ham MD Entered By: Linton Ham on 06/05/2019 16:57:19 -------------------------------------------------------------------------------- SuperBill Details Patient Name: Date of Service: [name] 06/05/2019 Medical Record  [id number] Patient Account Number: [id number] Date of Birth/Sex: Treating RN: 11-Feb-1996 (24 y.o. Orvan Falconer Primary Care Provider: Baltazar Apo Other Clinician: Referring Provider: Treating Provider/Extender:Alvah Gilder, Evlyn Kanner, [place] Dom in Treatment: 11 Diagnosis Coding ICD-10 Codes Code Description E10.621 Type 1 diabetes mellitus with foot ulcer M86.471 Chronic osteomyelitis with draining sinus, right ankle and foot E10.42 Type 1 diabetes mellitus with diabetic polyneuropathy L97.518 Non-pressure chronic ulcer of other part of right foot with other specified severity L97.512 Non-pressure chronic ulcer of other part of right foot with fat layer exposed I89.0 Lymphedema, not elsewhere classified Facility Procedures CPT4 Code Description: [id number] (Facility Use Only) [phone] - APPLY MULTLAY COMPRS LWR RT LEG Modifier: Quantity: 1 Physician Procedures CPT4 Code Description: 9150569 [id number] - WC PHYS LEVEL 3 - EST PT ICD-10 Diagnosis Description E10.621 Type 1 diabetes mellitus with foot ulcer M86.471 Chronic osteomyelitis with draining sinus, right ankle and f L97.518 Non-pressure chronic ulcer of other  part of right foot with Modifier: oot other specified s Quantity: 1 everity Electronic Signature(s) Signed: 06/05/2019 5:32:11 PM By: Linton Ham MD Entered By: Linton Ham on 06/05/2019 16:57:47

## 2019-06-07 ENCOUNTER — Inpatient Hospital Stay (HOSPITAL_COMMUNITY): Payer: 59

## 2019-06-07 ENCOUNTER — Encounter: Payer: Self-pay | Admitting: Family Medicine

## 2019-06-07 DIAGNOSIS — E10649 Type 1 diabetes mellitus with hypoglycemia without coma: Secondary | ICD-10-CM | POA: Diagnosis present

## 2019-06-07 DIAGNOSIS — M4628 Osteomyelitis of vertebra, sacral and sacrococcygeal region: Secondary | ICD-10-CM | POA: Diagnosis present

## 2019-06-07 DIAGNOSIS — Z20822 Contact with and (suspected) exposure to covid-19: Secondary | ICD-10-CM | POA: Diagnosis present

## 2019-06-07 DIAGNOSIS — E063 Autoimmune thyroiditis: Secondary | ICD-10-CM | POA: Diagnosis present

## 2019-06-07 DIAGNOSIS — R000 Tachycardia, unspecified: Secondary | ICD-10-CM | POA: Diagnosis present

## 2019-06-07 DIAGNOSIS — Z79899 Other long term (current) drug therapy: Secondary | ICD-10-CM | POA: Diagnosis not present

## 2019-06-07 DIAGNOSIS — J45909 Unspecified asthma, uncomplicated: Secondary | ICD-10-CM | POA: Diagnosis present

## 2019-06-07 DIAGNOSIS — L03818 Cellulitis of other sites: Secondary | ICD-10-CM | POA: Diagnosis present

## 2019-06-07 DIAGNOSIS — M869 Osteomyelitis, unspecified: Secondary | ICD-10-CM | POA: Diagnosis present

## 2019-06-07 DIAGNOSIS — L03319 Cellulitis of trunk, unspecified: Secondary | ICD-10-CM | POA: Diagnosis not present

## 2019-06-07 DIAGNOSIS — Z794 Long term (current) use of insulin: Secondary | ICD-10-CM | POA: Diagnosis not present

## 2019-06-07 DIAGNOSIS — L89152 Pressure ulcer of sacral region, stage 2: Secondary | ICD-10-CM | POA: Diagnosis present

## 2019-06-07 DIAGNOSIS — M609 Myositis, unspecified: Secondary | ICD-10-CM | POA: Diagnosis present

## 2019-06-07 DIAGNOSIS — Z833 Family history of diabetes mellitus: Secondary | ICD-10-CM | POA: Diagnosis not present

## 2019-06-07 DIAGNOSIS — A419 Sepsis, unspecified organism: Secondary | ICD-10-CM | POA: Diagnosis present

## 2019-06-07 DIAGNOSIS — Z91048 Other nonmedicinal substance allergy status: Secondary | ICD-10-CM | POA: Diagnosis not present

## 2019-06-07 DIAGNOSIS — I10 Essential (primary) hypertension: Secondary | ICD-10-CM | POA: Diagnosis present

## 2019-06-07 DIAGNOSIS — E1043 Type 1 diabetes mellitus with diabetic autonomic (poly)neuropathy: Secondary | ICD-10-CM | POA: Diagnosis present

## 2019-06-07 DIAGNOSIS — E559 Vitamin D deficiency, unspecified: Secondary | ICD-10-CM | POA: Diagnosis present

## 2019-06-07 DIAGNOSIS — E1069 Type 1 diabetes mellitus with other specified complication: Secondary | ICD-10-CM | POA: Diagnosis present

## 2019-06-07 DIAGNOSIS — D638 Anemia in other chronic diseases classified elsewhere: Secondary | ICD-10-CM | POA: Diagnosis present

## 2019-06-07 LAB — C-REACTIVE PROTEIN: CRP: 13.2 mg/dL — ABNORMAL HIGH (ref <1.0)

## 2019-06-07 LAB — CBC
HCT: 28.6 % — ABNORMAL LOW (ref 36.0–46.0)
Hemoglobin: 9.4 g/dL — ABNORMAL LOW (ref 12.0–15.0)
MCH: 26.8 pg (ref 26.0–34.0)
MCHC: 32.9 g/dL (ref 30.0–36.0)
MCV: 81.5 fL (ref 80.0–100.0)
Platelets: 288 10*3/uL (ref 150–400)
RBC: 3.51 MIL/uL — ABNORMAL LOW (ref 3.87–5.11)
RDW: 13.9 % (ref 11.5–15.5)
WBC: 20.2 10*3/uL — ABNORMAL HIGH (ref 4.0–10.5)
nRBC: 0 % (ref 0.0–0.2)

## 2019-06-07 LAB — COMPREHENSIVE METABOLIC PANEL
ALT: 36 U/L (ref 0–44)
AST: 55 U/L — ABNORMAL HIGH (ref 15–41)
Albumin: 2 g/dL — ABNORMAL LOW (ref 3.5–5.0)
Alkaline Phosphatase: 433 U/L — ABNORMAL HIGH (ref 38–126)
Anion gap: 9 (ref 5–15)
BUN: 19 mg/dL (ref 6–20)
CO2: 26 mmol/L (ref 22–32)
Calcium: 8.3 mg/dL — ABNORMAL LOW (ref 8.9–10.3)
Chloride: 95 mmol/L — ABNORMAL LOW (ref 98–111)
Creatinine, Ser: 0.99 mg/dL (ref 0.44–1.00)
GFR calc Af Amer: >60 mL/min (ref >60)
GFR calc non Af Amer: >60 mL/min (ref >60)
Glucose, Bld: 234 mg/dL — ABNORMAL HIGH (ref 70–99)
Potassium: 4.1 mmol/L (ref 3.5–5.1)
Sodium: 130 mmol/L — ABNORMAL LOW (ref 135–145)
Total Bilirubin: 0.4 mg/dL (ref 0.3–1.2)
Total Protein: 8.1 g/dL (ref 6.5–8.1)

## 2019-06-07 LAB — RETICULOCYTES
Immature Retic Fract: 14.2 % (ref 2.3–15.9)
RBC.: 3.53 MIL/uL — ABNORMAL LOW (ref 3.87–5.11)
Retic Count, Absolute: 42.7 10*3/uL (ref 19.0–186.0)
Retic Ct Pct: 1.2 % (ref 0.4–3.1)

## 2019-06-07 LAB — RESPIRATORY PANEL BY RT PCR (FLU A&B, COVID)
Influenza A by PCR: NEGATIVE
Influenza B by PCR: NEGATIVE
SARS Coronavirus 2 by RT PCR: NEGATIVE

## 2019-06-07 LAB — POC URINE PREG, ED: Preg Test, Ur: NEGATIVE

## 2019-06-07 LAB — URINALYSIS, ROUTINE W REFLEX MICROSCOPIC
Bacteria, UA: NONE SEEN
Bilirubin Urine: NEGATIVE
Glucose, UA: NEGATIVE mg/dL
Ketones, ur: NEGATIVE mg/dL
Leukocytes,Ua: NEGATIVE
Nitrite: NEGATIVE
Protein, ur: 100 mg/dL — AB
Specific Gravity, Urine: 1.016 (ref 1.005–1.030)
pH: 5 (ref 5.0–8.0)

## 2019-06-07 LAB — BASIC METABOLIC PANEL
Anion gap: 10 (ref 5–15)
BUN: 17 mg/dL (ref 6–20)
CO2: 25 mmol/L (ref 22–32)
Calcium: 7.9 mg/dL — ABNORMAL LOW (ref 8.9–10.3)
Chloride: 95 mmol/L — ABNORMAL LOW (ref 98–111)
Creatinine, Ser: 0.8 mg/dL (ref 0.44–1.00)
GFR calc Af Amer: >60 mL/min (ref >60)
GFR calc non Af Amer: >60 mL/min (ref >60)
Glucose, Bld: 231 mg/dL — ABNORMAL HIGH (ref 70–99)
Potassium: 3.9 mmol/L (ref 3.5–5.1)
Sodium: 130 mmol/L — ABNORMAL LOW (ref 135–145)

## 2019-06-07 LAB — GLUCOSE, CAPILLARY
Glucose-Capillary: 199 mg/dL — ABNORMAL HIGH (ref 70–99)
Glucose-Capillary: 140 mg/dL — ABNORMAL HIGH (ref 70–99)
Glucose-Capillary: 100 mg/dL — ABNORMAL HIGH (ref 70–99)

## 2019-06-07 LAB — FOLATE: Folate: 7.9 ng/mL (ref >5.9)

## 2019-06-07 LAB — IRON AND TIBC
Iron: 11 ug/dL — ABNORMAL LOW (ref 28–170)
Saturation Ratios: 4 % — ABNORMAL LOW (ref 10.4–31.8)
TIBC: 255 ug/dL (ref 250–450)
UIBC: 244 ug/dL

## 2019-06-07 LAB — VITAMIN B12: Vitamin B-12: 1265 pg/mL — ABNORMAL HIGH (ref 180–914)

## 2019-06-07 LAB — FERRITIN: Ferritin: 131 ng/mL (ref 11–307)

## 2019-06-07 LAB — HIV ANTIBODY (ROUTINE TESTING W REFLEX): HIV Screen 4th Generation wRfx: NONREACTIVE

## 2019-06-07 LAB — LACTIC ACID, PLASMA
Lactic Acid, Venous: 0.7 mmol/L (ref 0.5–1.9)
Lactic Acid, Venous: 0.9 mmol/L (ref 0.5–1.9)

## 2019-06-07 LAB — PREGNANCY, URINE: Preg Test, Ur: NEGATIVE

## 2019-06-07 LAB — PROCALCITONIN: Procalcitonin: 0.31 ng/mL

## 2019-06-07 LAB — SEDIMENTATION RATE: Sed Rate: 140 mm/hr — ABNORMAL HIGH (ref 0–22)

## 2019-06-07 LAB — CBG MONITORING, ED: Glucose-Capillary: 204 mg/dL — ABNORMAL HIGH (ref 70–99)

## 2019-06-07 MED ORDER — BASAGLAR KWIKPEN 100 UNIT/ML ~~LOC~~ SOPN: 30.0000 [IU] | PEN_INJECTOR | Freq: Every day | SUBCUTANEOUS | Status: DC

## 2019-06-07 MED ORDER — SODIUM CHLORIDE 0.9 % IV SOLN
2.0000 g | Freq: Three times a day (TID) | INTRAVENOUS | Status: DC
  Administered 2019-06-07 – 2019-06-09 (×7): 2 g via INTRAVENOUS
  Filled 2019-06-07 (×8): qty 2

## 2019-06-07 MED ORDER — POLYETHYLENE GLYCOL 3350 17 G PO PACK: 17.0000 g | PACK | Freq: Every day | ORAL | Status: DC | PRN

## 2019-06-07 MED ORDER — BISACODYL 10 MG RE SUPP: 10.0000 mg | Freq: Every day | RECTAL | Status: DC | PRN

## 2019-06-07 MED ORDER — SODIUM CHLORIDE 0.9 % IV SOLN: 250.0000 mL | INTRAVENOUS | Status: DC | PRN

## 2019-06-07 MED ORDER — SODIUM CHLORIDE 0.9 % IV BOLUS
500.0000 mL | Freq: Once | INTRAVENOUS | Status: AC
  Administered 2019-06-07: 500 mL via INTRAVENOUS

## 2019-06-07 MED ORDER — GADOBUTROL 1 MMOL/ML IV SOLN
6.0000 mL | Freq: Once | INTRAVENOUS | Status: AC | PRN
  Administered 2019-06-07: 6 mL via INTRAVENOUS

## 2019-06-07 MED ORDER — ACETAMINOPHEN 325 MG PO TABS
650.0000 mg | ORAL_TABLET | Freq: Four times a day (QID) | ORAL | Status: DC | PRN
  Administered 2019-06-11: 09:00:00 650 mg via ORAL
  Filled 2019-06-07: qty 2

## 2019-06-07 MED ORDER — FENTANYL CITRATE (PF) 100 MCG/2ML IJ SOLN: 50.0000 ug | INTRAMUSCULAR | Status: DC | PRN

## 2019-06-07 MED ORDER — SODIUM CHLORIDE 0.9% FLUSH: 3.0000 mL | INTRAVENOUS | Status: DC | PRN

## 2019-06-07 MED ORDER — CHLORHEXIDINE GLUCONATE CLOTH 2 % EX PADS
6.0000 | MEDICATED_PAD | Freq: Every day | CUTANEOUS | Status: DC
  Administered 2019-06-07 – 2019-06-08 (×2): 6 via TOPICAL

## 2019-06-07 MED ORDER — LACTATED RINGERS IV BOLUS
1000.0000 mL | Freq: Once | INTRAVENOUS | Status: AC
  Administered 2019-06-07: 1000 mL via INTRAVENOUS

## 2019-06-07 MED ORDER — VANCOMYCIN HCL 500 MG/100ML IV SOLN
500.0000 mg | Freq: Once | INTRAVENOUS | Status: AC
  Administered 2019-06-07: 500 mg via INTRAVENOUS
  Filled 2019-06-07: qty 100

## 2019-06-07 MED ORDER — CLINDAMYCIN PHOSPHATE 600 MG/50ML IV SOLN: 600.0000 mg | Freq: Three times a day (TID) | INTRAVENOUS | Status: DC

## 2019-06-07 MED ORDER — INSULIN GLARGINE 100 UNIT/ML ~~LOC~~ SOLN
30.0000 [IU] | Freq: Every day | SUBCUTANEOUS | Status: DC
  Administered 2019-06-10: 30 [IU] via SUBCUTANEOUS
  Filled 2019-06-07 (×5): qty 0.3

## 2019-06-07 MED ORDER — INSULIN ASPART 100 UNIT/ML ~~LOC~~ SOLN
0.0000 [IU] | Freq: Three times a day (TID) | SUBCUTANEOUS | Status: DC
  Administered 2019-06-07: 2 [IU] via SUBCUTANEOUS
  Administered 2019-06-07: 3 [IU] via SUBCUTANEOUS
  Administered 2019-06-10 (×2): 2 [IU] via SUBCUTANEOUS

## 2019-06-07 MED ORDER — LACTATED RINGERS IV SOLN: INTRAVENOUS | Status: DC

## 2019-06-07 MED ORDER — ENOXAPARIN SODIUM 40 MG/0.4ML ~~LOC~~ SOLN
40.0000 mg | SUBCUTANEOUS | Status: DC
  Administered 2019-06-07 – 2019-06-10 (×4): 40 mg via SUBCUTANEOUS
  Filled 2019-06-07 (×4): qty 0.4

## 2019-06-07 MED ORDER — ACETAMINOPHEN 650 MG RE SUPP: 650.0000 mg | Freq: Four times a day (QID) | RECTAL | Status: DC | PRN

## 2019-06-07 MED ORDER — INSULIN ASPART 100 UNIT/ML ~~LOC~~ SOLN: 0.0000 [IU] | Freq: Every day | SUBCUTANEOUS | Status: DC

## 2019-06-07 MED ORDER — VANCOMYCIN HCL 750 MG/150ML IV SOLN
750.0000 mg | Freq: Two times a day (BID) | INTRAVENOUS | Status: DC
  Administered 2019-06-07 – 2019-06-11 (×9): 750 mg via INTRAVENOUS
  Filled 2019-06-07 (×11): qty 150

## 2019-06-07 MED ORDER — TRAMADOL HCL 50 MG PO TABS
50.0000 mg | ORAL_TABLET | Freq: Three times a day (TID) | ORAL | Status: DC | PRN
  Administered 2019-06-07 – 2019-06-12 (×5): 50 mg via ORAL
  Filled 2019-06-07 (×5): qty 1

## 2019-06-07 MED ORDER — ONDANSETRON HCL 4 MG PO TABS: 4.0000 mg | ORAL_TABLET | Freq: Four times a day (QID) | ORAL | Status: DC | PRN

## 2019-06-07 MED ORDER — ONDANSETRON HCL 4 MG/2ML IJ SOLN: 4.0000 mg | Freq: Four times a day (QID) | INTRAMUSCULAR | Status: DC | PRN

## 2019-06-07 MED ORDER — MORPHINE SULFATE (PF) 2 MG/ML IV SOLN
1.0000 mg | INTRAVENOUS | Status: DC | PRN
  Administered 2019-06-07 – 2019-06-12 (×12): 1 mg via INTRAVENOUS
  Filled 2019-06-07 (×13): qty 1

## 2019-06-07 MED ORDER — CLINDAMYCIN PHOSPHATE 600 MG/50ML IV SOLN
600.0000 mg | Freq: Three times a day (TID) | INTRAVENOUS | Status: DC
  Administered 2019-06-07 – 2019-06-09 (×6): 600 mg via INTRAVENOUS
  Filled 2019-06-07 (×6): qty 50

## 2019-06-07 MED ORDER — SODIUM CHLORIDE 0.9% FLUSH
3.0000 mL | Freq: Two times a day (BID) | INTRAVENOUS | Status: DC
  Administered 2019-06-07 – 2019-06-12 (×10): 3 mL via INTRAVENOUS

## 2019-06-07 MED ORDER — IOHEXOL 300 MG/ML  SOLN
100.0000 mL | Freq: Once | INTRAMUSCULAR | Status: AC | PRN
  Administered 2019-06-07: 100 mL via INTRAVENOUS

## 2019-06-07 MED ORDER — CLINDAMYCIN PHOSPHATE 600 MG/50ML IV SOLN
600.0000 mg | Freq: Once | INTRAVENOUS | Status: AC
  Administered 2019-06-07: 600 mg via INTRAVENOUS
  Filled 2019-06-07: qty 50

## 2019-06-07 MED ORDER — NAPHAZOLINE-GLYCERIN 0.012-0.2 % OP SOLN
1.0000 [drp] | Freq: Four times a day (QID) | OPHTHALMIC | Status: DC | PRN
  Filled 2019-06-07: qty 15

## 2019-06-07 NOTE — Progress Notes (Signed)
Inpatient Diabetes Program Recommendations  AACE/ADA: New Consensus Statement on Inpatient Glycemic Control (2015)  Target Ranges:  Prepandial:   less than 140 mg/dL      Peak postprandial:   less than 180 mg/dL (1-2 hours)      Critically ill patients:  140 - 180 mg/dL   Lab Results  Component Value Date   GLUCAP 199 (H) 06/07/2019   HGBA1C 9.3 (H) 05/09/2019    Review of Glycemic Control  Diabetes history: DM1 Outpatient Diabetes medications: Basaglar 30 units QHS, Novolog s/s 8-11 units tidwc Current orders for Inpatient glycemic control: Lantus 30 units QHS, Novolog 0-15 units tidwc and 0-5 units QHS  HgbA1C - 9.3% - uncontrolled Freestyle Libre glucose sensor ordered by Endo.  Pt is Type 1 DM and makes no insulin. Will need meal coverage insulin since eating.  Inpatient Diabetes Program Recommendations:     Decrease Novolog to 0-9 units tidwc and 0-5 units QHS Add Novolog 6 units tidwc for meal coverage insulin if pt eats > 50% meals  Follow closely.  Thank you. Lorenda Peck, RD, LDN, CDE Inpatient Diabetes Coordinator [phone]

## 2019-06-07 NOTE — Progress Notes (Signed)
Per HPI: Lindsey Murillo is a 24 y.o. female with medical history significant of type 1 diabetes who presented to the ER with increased back pain.  Patient states she had increased pain over the lower back and sacral region over the last 2 days.  She is had difficulty in the sitting position due to increased pain.  Today she started having a fever with chills.  Currently not on antibiotics.  Has a history of diabetes mellitus type 1 which is uncontrolled.  No prior history of spinal abscesses, osteomyelitis. ED Course:  Vital Signs reviewed on presentation, significant for temperature 101.1, heart rate 125, blood pressure 143/104, saturation 99% room air. Labs reviewed, significant for sodium 130, potassium 4.1, chloride 95, glucose 234, BUN 19, creatinine 0.9, albumin 2.0, LFTs within normal meds, lactic acid 0.9, WBC count 20.9, hemoglobin 8.5, hematocrit 26, platelets 342, pregnancy test is negative.  Flu PCR negative, SARS Covid RT-PCR negative, urinalysis negative, blood and urine cultures have been sent. Imaging personally Reviewed, CT of the abdomen pelvis with contrast shows diffuse anasarca seen throughout the external soft tissue with focal soft tissue swelling and inflammatory stranding involving the subcutaneous fat posterior to the sacrum concerning for infection/cellulitis.  Overlying skin thickening.  Inflammatory changes are fairly ill-defined at this time, most consistent with phlegmon.  No definite discrete abscess or drainable fluid collection.  Small focus of probable gas within the region which could reflect infection with gas-forming organism or possible communication with overlying skin.  Evidence for osseous erosion of the underlying sacrum at approximately S3-S4 segment highly suggestive of osteomyelitis. EKG personally reviewed, shows sinus tachycardia, no acute ST-T changes.  Patient seen and evaluated at bedside this morning.  She has just had MRI demonstrating some  osteomyelitis.  General surgery consultation pending.  Continue on current IV antibiotics monitor blood cultures with no growth noted thus far.  Obtain wound culture.  She is noted to have some blurry vision and will try eyedrops for now.  No other neurological deficits identified.  We will plan to check cultures and see if any surgical cultures could be obtained as well.  Plan to discuss with ID plans for IV antibiotic treatment in the next 48 hours.  Plan to keep at this facility with no need for transfer noted.  Total care time: 30 minutes.

## 2019-06-07 NOTE — ED Notes (Signed)
Pt was notified that she would not be going to [facility] she would be staying her after talking to the AM doctor.

## 2019-06-07 NOTE — ED Notes (Signed)
Pt transferred to bedside commode without assistance.

## 2019-06-07 NOTE — ED Notes (Signed)
Patient placement notified of intent to keep patient here at [facility] per Dr Manuella Ghazi. [facility] also notified to cancel transport.

## 2019-06-07 NOTE — ED Notes (Signed)
Pt's nose ring removed from left nare for MRI the ring it was removed with a little difficulty the ring was shaped like a L the ring was metal with a clear stone on the end

## 2019-06-07 NOTE — Consult Note (Signed)
Aware of consultation.  Discussed with Dr. Manuella Ghazi.  Awaiting MRI results.  Full consult to follow.

## 2019-06-07 NOTE — Consult Note (Signed)
Reason for Consult: Cellulitis, left buttock/sacrum Referring Physician: Dr. Eather Colas is an 24 y.o. female.  HPI: Patient is a 24 year old black female with a history of diabetes mellitus who has been followed by Dr. Dellia Nims of a wound care center for osteomyelitis of right foot who presented to the emergency room with swelling and fevers in the right upper buttock region.  She was noted in the emergency room to have a white blood cell count of 20,000.  Her lactic acid level was within normal limits.  A CT scan of the pelvis revealed soft tissue swelling with inflammatory stranding involving the subcutaneous tissue over the sacrum with a small focus of air present concerning for an infectious process.  An MRI then revealed a focal area of cellulitis and myositis involving the superior aspect of the right gluteus maximus muscle.  Patient has had limited drainage from this region.  She currently states she has no significant pain in that area.  Past Medical History:  Diagnosis Date  . Acanthosis nigricans, acquired   . Asthma   . Diabetic autonomic neuropathy ([place])   . Diabetic peripheral neuropathy ([place])   . Environmental allergies   . Goiter   . Hypoglycemia associated with diabetes ([place])   . Tachycardia   . Thyroiditis, autoimmune   . Type 1 diabetes mellitus in patient age 25-19 years with HbA1C goal below 7.5     Past Surgical History:  Procedure Laterality Date  . BIOPSY  08/27/2016   Procedure: BIOPSY;  Surgeon: Danie Binder, MD;  [facility];  Service: Endoscopy;;  duodenum; gastric  . COLONOSCOPY    . ESOPHAGOGASTRODUODENOSCOPY N/A 08/27/2016   Dr. Oneida Alar: mild gastritis. Negative celiac. No obvious source for dyspepsia/diarrhea    Family History  Problem Relation Age of Onset  . Diabetes Mother        Type II DM  . Thyroid disease Mother   . Diabetes Maternal Grandmother        Type II DM  . Diabetes Cousin        Type II DM  . Colon cancer  Neg Hx   . Colon polyps Neg Hx     Social History:  reports that she has never smoked. She has never used smokeless tobacco. She reports that she does not drink alcohol or use drugs.  Allergies: No Known Allergies  Medications:  I have reviewed the patient's current medications. Scheduled: . Chlorhexidine Gluconate Cloth  6 each Topical Q0600  . enoxaparin (LOVENOX) injection  40 mg Subcutaneous Q24H  . insulin aspart  0-15 Units Subcutaneous TID WC  . insulin aspart  0-5 Units Subcutaneous QHS  . insulin glargine  30 Units Subcutaneous QHS  . sodium chloride flush  3 mL Intravenous Q12H    Results for orders placed or performed during the hospital encounter of 06/06/19 (from the past 48 hour(s))  Respiratory Panel by RT PCR (Flu A&B, Covid) - Nasopharyngeal Swab     Status: None   Collection Time: 06/06/19 11:41 PM   Specimen: Nasopharyngeal Swab  Result Value Ref Range   SARS Coronavirus 2 by RT PCR NEGATIVE NEGATIVE    Comment: (NOTE) SARS-CoV-2 target nucleic acids are NOT DETECTED. The SARS-CoV-2 RNA is generally detectable in upper respiratoy specimens during the acute phase of infection. The lowest concentration of SARS-CoV-2 viral copies this assay can detect is 131 copies/mL. A negative result does not preclude SARS-Cov-2 infection and should not be used as the  sole basis for treatment or other patient management decisions. A negative result may occur with  improper specimen collection/handling, submission of specimen other than nasopharyngeal swab, presence of viral mutation(s) within the areas targeted by this assay, and inadequate number of viral copies (<131 copies/mL). A negative result must be combined with clinical observations, patient history, and epidemiological information. The expected result is Negative. Fact Sheet for Patients:  PinkCheek.be Fact Sheet for Healthcare Providers:   GravelBags.it This test is not yet ap proved or cleared by the [place] FDA and  has been authorized for detection and/or diagnosis of SARS-CoV-2 by FDA under an Emergency Use Authorization (EUA). This EUA will remain  in effect (meaning this test can be used) for the duration of the COVID-19 declaration under Section 564(b)(1) of the Act, 21 U.S.C. section 360bbb-3(b)(1), unless the authorization is terminated or revoked sooner.    Influenza A by PCR NEGATIVE NEGATIVE   Influenza B by PCR NEGATIVE NEGATIVE    Comment: (NOTE) The Xpert Xpress SARS-CoV-2/FLU/RSV assay is intended as an aid in  the diagnosis of influenza from Nasopharyngeal swab specimens and  should not be used as a sole basis for treatment. Nasal washings and  aspirates are unacceptable for Xpert Xpress SARS-CoV-2/FLU/RSV  testing. Fact Sheet for Patients: PinkCheek.be Fact Sheet for Healthcare Providers: GravelBags.it This test is not yet approved or cleared by the [place] FDA and  has been authorized for detection and/or diagnosis of SARS-CoV-2 by  FDA under an Emergency Use Authorization (EUA). This EUA will remain  in effect (meaning this test can be used) for the duration of the  Covid-19 declaration under Section 564(b)(1) of the Act, 21  U.S.C. section 360bbb-3(b)(1), unless the authorization is  terminated or revoked. Performed at [facility], [address], [place], [place] [address]   Comprehensive metabolic panel     Status: Abnormal   Collection Time: 06/06/19 11:43 PM  Result Value Ref Range   Sodium 130 (L) 135 - 145 mmol/L   Potassium 4.1 3.5 - 5.1 mmol/L   Chloride 95 (L) 98 - 111 mmol/L   CO2 26 22 - 32 mmol/L   Glucose, Bld 234 (H) 70 - 99 mg/dL   BUN 19 6 - 20 mg/dL   Creatinine, Ser 0.99 0.44 - 1.00 mg/dL   Calcium 8.3 (L) 8.9 - 10.3 mg/dL   Total Protein 8.1 6.5 - 8.1 g/dL   Albumin 2.0 (L)  3.5 - 5.0 g/dL   AST 55 (H) 15 - 41 U/L   ALT 36 0 - 44 U/L   Alkaline Phosphatase 433 (H) 38 - 126 U/L   Total Bilirubin 0.4 0.3 - 1.2 mg/dL   GFR calc non Af Amer >60 >60 mL/min   GFR calc Af Amer >60 >60 mL/min   Anion gap 9 5 - 15    Comment: Performed at [facility], [address], [place], [place] [address]  CBC     Status: Abnormal   Collection Time: 06/06/19 11:43 PM  Result Value Ref Range   WBC 20.9 (H) 4.0 - 10.5 K/uL   RBC 3.28 (L) 3.87 - 5.11 MIL/uL   Hemoglobin 8.5 (L) 12.0 - 15.0 g/dL   HCT 26.6 (L) 36.0 - 46.0 %   MCV 81.1 80.0 - 100.0 fL   MCH 25.9 (L) 26.0 - 34.0 pg   MCHC 32.0 30.0 - 36.0 g/dL   RDW 13.9 11.5 - 15.5 %   Platelets 342 150 - 400 K/uL  nRBC 0.0 0.0 - 0.2 %    Comment: Performed at [facility], [address] [place], [place] [address]  Lactic acid, plasma     Status: None   Collection Time: 06/06/19 11:45 PM  Result Value Ref Range   Lactic Acid, Venous 0.9 0.5 - 1.9 mmol/L    Comment: Performed at [facility], [address], [place], [place] [address]  Blood Culture (routine x 2)     Status: None (Preliminary result)   Collection Time: 06/06/19 11:45 PM   Specimen: BLOOD RIGHT FOREARM  Result Value Ref Range   Specimen Description BLOOD RIGHT FOREARM    Special Requests      BOTTLES DRAWN AEROBIC AND ANAEROBIC Blood Culture adequate volume   Culture      NO GROWTH < 12 HOURS Performed at  [facility], [address], [place], [place] [address]    Report Status PENDING   Blood Culture (routine x 2)     Status: None (Preliminary result)   Collection Time: 06/06/19 11:46 PM   Specimen: BLOOD  Result Value Ref Range   Specimen Description BLOOD RIGHT ANTECUBITAL    Special Requests      BOTTLES DRAWN AEROBIC AND ANAEROBIC Blood Culture adequate volume   Culture      NO GROWTH < 12 HOURS Performed at [facility], [address] [place], [place] [address]    Report Status PENDING   Lactic acid, plasma     Status: None   Collection  Time: 06/07/19  1:53 AM  Result Value Ref Range   Lactic Acid, Venous 0.7 0.5 - 1.9 mmol/L    Comment: Performed at [facility], [address] [place], [place] [address]  HIV Antibody (routine testing w rflx)     Status: None   Collection Time: 06/07/19  1:53 AM  Result Value Ref Range   HIV Screen 4th Generation wRfx NON REACTIVE NON REACTIVE    Comment: Performed at [facility], [place] [address] [place], [place] [address]  Sedimentation rate     Status: Abnormal   Collection Time: 06/07/19  1:53 AM  Result Value Ref Range   Sed Rate 140 (H) 0 - 22 mm/hr    Comment: Performed at [facility], [address], [place], [place] [address]  C-reactive protein     Status: Abnormal   Collection Time: 06/07/19  1:53 AM  Result Value Ref Range   CRP 13.2 (H) <1.0 mg/dL    Comment: Performed at [facility], [address], [place], [place] [address]  Procalcitonin - Baseline     Status: None   Collection Time: 06/07/19  1:53 AM  Result Value Ref Range   Procalcitonin 0.31 ng/mL    Comment:        Interpretation: PCT (Procalcitonin) <= 0.5 ng/mL: Systemic infection (sepsis) is not likely. Local bacterial infection is possible. (NOTE)       Sepsis PCT Algorithm           Lower Respiratory Tract                                      Infection PCT Algorithm    ----------------------------     ----------------------------         PCT < 0.25 ng/mL                PCT < 0.10 ng/mL  Strongly encourage             Strongly discourage   discontinuation of antibiotics    initiation of antibiotics    ----------------------------     -----------------------------       PCT 0.25 - 0.50 ng/mL            PCT 0.10 - 0.25 ng/mL               OR       >80% decrease in PCT            Discourage initiation of                                            antibiotics      Encourage discontinuation           of antibiotics    ----------------------------     -----------------------------          PCT >= 0.50 ng/mL              PCT 0.26 - 0.50 ng/mL               AND        <80% decrease in PCT             Encourage initiation of                                             antibiotics       Encourage continuation           of antibiotics    ----------------------------     -----------------------------        PCT >= 0.50 ng/mL                  PCT > 0.50 ng/mL               AND         increase in PCT                  Strongly encourage                                      initiation of antibiotics    Strongly encourage escalation           of antibiotics                                     -----------------------------                                           PCT <= 0.25 ng/mL                                                 OR                                        >  80% decrease in PCT                                     Discontinue / Do not initiate                                             antibiotics Performed at [facility], [address] [place], [place] [address]   Vitamin B12     Status: Abnormal   Collection Time: 06/07/19  1:53 AM  Result Value Ref Range   Vitamin B-12 1,265 (H) 180 - 914 pg/mL    Comment: (NOTE) This assay is not validated for testing neonatal or myeloproliferative syndrome specimens for Vitamin B12 levels. Performed at [facility], [address] [place], [place] [address]   Iron and TIBC     Status: Abnormal   Collection Time: 06/07/19  1:53 AM  Result Value Ref Range   Iron 11 (L) 28 - 170 ug/dL   TIBC 255 250 - 450 ug/dL   Saturation Ratios 4 (L) 10.4 - 31.8 %   UIBC 244 ug/dL    Comment: Performed at [facility], [address], [place], [place] [address]  Ferritin     Status: None   Collection Time: 06/07/19  1:53 AM  Result Value Ref Range   Ferritin 131 11 - 307 ng/mL    Comment: Performed at [facility], [address] [place], [place] [address]  Reticulocytes     Status: Abnormal   Collection Time: 06/07/19  1:53 AM   Result Value Ref Range   Retic Ct Pct 1.2 0.4 - 3.1 %   RBC. 3.53 (L) 3.87 - 5.11 MIL/uL   Retic Count, Absolute 42.7 19.0 - 186.0 K/uL   Immature Retic Fract 14.2 2.3 - 15.9 %    Comment: Performed at [facility], [address], [place], [place] [address]  Pregnancy, urine     Status: None   Collection Time: 06/07/19  2:12 AM  Result Value Ref Range   Preg Test, Ur NEGATIVE NEGATIVE    Comment:        THE SENSITIVITY OF THIS METHODOLOGY IS >20 mIU/mL. Performed at [facility], [address] [place], [place] [address]   Urinalysis, Routine w reflex microscopic     Status: Abnormal   Collection Time: 06/07/19  2:12 AM  Result Value Ref Range   Color, Urine YELLOW YELLOW   APPearance HAZY (A) CLEAR   Specific Gravity, Urine 1.016 1.005 - 1.030   pH 5.0 5.0 - 8.0   Glucose, UA NEGATIVE NEGATIVE mg/dL   Hgb urine dipstick MODERATE (A) NEGATIVE   Bilirubin Urine NEGATIVE NEGATIVE   Ketones, ur NEGATIVE NEGATIVE mg/dL   Protein, ur 100 (A) NEGATIVE mg/dL   Nitrite NEGATIVE NEGATIVE   Leukocytes,Ua NEGATIVE NEGATIVE   RBC / HPF 11-20 0 - 5 RBC/hpf   WBC, UA 0-5 0 - 5 WBC/hpf   Bacteria, UA NONE SEEN NONE SEEN   Squamous Epithelial / LPF 0-5 0 - 5   Mucus PRESENT    Hyaline Casts, UA PRESENT     Comment: Performed at [facility], [address] [place], [place] [address]  POC urine preg, ED     Status: None   Collection Time: 06/07/19  2:15 AM  Result Value Ref Range   Preg Test, Ur NEGATIVE NEGATIVE    Comment:        THE SENSITIVITY OF THIS METHODOLOGY IS >24 mIU/mL   CBG monitoring, ED     Status: Abnormal   Collection Time: 06/07/19  3:28 AM  Result Value Ref Range   Glucose-Capillary 204 (H) 70 - 99 mg/dL  Basic metabolic panel     Status: Abnormal   Collection Time: 06/07/19  6:06 AM  Result Value Ref Range   Sodium 130 (L) 135 - 145 mmol/L   Potassium 3.9 3.5 - 5.1 mmol/L   Chloride 95 (L) 98 - 111 mmol/L   CO2 25 22 - 32 mmol/L   Glucose, Bld 231 (H) 70 -  99 mg/dL   BUN 17 6 - 20 mg/dL   Creatinine, Ser 0.80 0.44 - 1.00 mg/dL   Calcium 7.9 (L) 8.9 - 10.3 mg/dL   GFR calc non Af Amer >60 >60 mL/min   GFR calc Af Amer >60 >60 mL/min   Anion gap 10 5 - 15    Comment: Performed at [facility], [address] [place], [place] [address]  CBC     Status: Abnormal   Collection Time: 06/07/19  6:06 AM  Result Value Ref Range   WBC 20.2 (H) 4.0 - 10.5 K/uL   RBC 3.51 (L) 3.87 - 5.11 MIL/uL   Hemoglobin 9.4 (L) 12.0 - 15.0 g/dL   HCT 28.6 (L) 36.0 - 46.0 %   MCV 81.5 80.0 - 100.0 fL   MCH 26.8 26.0 - 34.0 pg   MCHC 32.9 30.0 - 36.0 g/dL   RDW 13.9 11.5 - 15.5 %   Platelets 288 150 - 400 K/uL   nRBC 0.0 0.0 - 0.2 %    Comment: Performed at [facility], [address], [place], [place] [address]  Folate     Status: None   Collection Time: 06/07/19  7:21 AM  Result Value Ref Range   Folate 7.9 >5.9 ng/mL    Comment: Performed at [facility], [address], [place], [place] [address]  Glucose, capillary     Status: Abnormal   Collection Time: 06/07/19 11:29 AM  Result Value Ref Range   Glucose-Capillary 199 (H) 70 - 99 mg/dL    CT ABDOMEN PELVIS W CONTRAST  Result Date: 06/07/2019 CLINICAL DATA:  Initial evaluation for acute abdominal abscess/infection. EXAM: CT ABDOMEN AND PELVIS WITH CONTRAST TECHNIQUE: Multidetector CT imaging of the abdomen and pelvis was performed using the standard protocol following bolus administration of intravenous contrast. CONTRAST:  124mL OMNIPAQUE IOHEXOL 300 MG/ML  SOLN COMPARISON:  None. FINDINGS: Lower chest: Visualized lung bases are clear. Hepatobiliary: Liver demonstrates a normal contrast enhanced appearance. Mild diffuse periportal edema. Gallbladder contracted without abnormality. No biliary dilatation. Pancreas: Few scattered calcifications noted within the pancreas, stable. 2.2 x 4.6 cm cyst positioned just superior to the pancreatic tail again seen, unchanged. No acute peripancreatic inflammation.  Spleen: Spleen within normal limits. Adrenals/Urinary Tract: Adrenal glands are normal. Kidneys equal in size with symmetric enhancement. No nephrolithiasis, hydronephrosis or focal enhancing renal mass. No visible hydroureter. Bladder moderately distended without abnormality. Stomach/Bowel: Stomach within normal limits. No evidence for bowel obstruction. No definite acute inflammatory changes seen about the bowels. Appendix not well visualized. Vascular/Lymphatic: Normal intravascular enhancement seen throughout the intra-abdominal aorta. Mesenteric vessels patent proximally. No pathologically enlarged intra-abdominal or pelvic lymph nodes. Mildly enlarged bilateral inguinal lymph nodes measure up to 18 mm on the right  and 14 mm on the left, indeterminate, but could be reactive. Reproductive: Uterus and ovaries within normal limits for age. Other: No free intraperitoneal air. Small volume free fluid present within the right lower quadrant and pelvis, nonspecific, but could be reactive and/or physiologic. Mild diffuse mesenteric edema noted. Musculoskeletal: Diffuse anasarca seen throughout the external soft tissues. There is focal soft tissue swelling and inflammatory stranding involving the subcutaneous fat posterior to the sacrum, concerning for infection/cellulitis. Overlying skin thickening. Inflammatory changes are fairly ill-defined at this time, most consistent with phlegmon. No definite discrete abscess or drainable fluid collection. There is a small focus of probable gas within this region, which could reflect infection with gas-forming organism or possibly communication with the overlying skin (series 2, image 59). No other dissecting soft tissue emphysema seen elsewhere. Additionally, evidence for osseous erosion at the underlying sacrum at approximately the S3/S4 segment, highly suggestive of associated osteomyelitis. IMPRESSION: 1. Soft tissue swelling with inflammatory stranding involving the  subcutaneous fat posterior to the sacrum, concerning for infection/cellulitis. Small focus of gas within this region could reflect changes related to an aggressive soft tissue infection with gas-forming organism or possibly communication with the overlying skin (series 2, image 59). Correlation with physical exam for possible ulceration and/or sinus tract recommended. 2. Associated osseous erosion at the underlying sacrum at approximately the S3/S4 segment, highly suggestive of associated osteomyelitis. 3. Small volume free fluid within the right lower quadrant, nonspecific, but could be reactive and/or physiologic. 4. Mildly enlarged bilateral inguinal lymph nodes, nonspecific, but may be reactive. 5. Atrophic pancreas with scattered calcifications, consistent with chronic pancreatitis. 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 Electronically Signed   By: Jeannine Boga M.D.   On: 06/07/2019 01:59   MR SACRUM SI JOINTS W WO CONTRAST  Result Date: 06/07/2019 CLINICAL DATA:  Low back/buttock/sacral pain with fever and abnormal CT scan. Elevated white blood cell count. EXAM: MRI PELVIS WITHOUT AND WITH CONTRAST TECHNIQUE: Multiplanar multisequence MR imaging of the pelvis was performed both before and after administration of intravenous contrast. CONTRAST:  98mL GADAVIST GADOBUTROL 1 MMOL/ML IV SOLN COMPARISON:  CT scan, same date. FINDINGS: There appears to be an open wound involving the right lower sacral area with fluid contacting the skin. The right gluteus maximus muscle is markedly thickened and demonstrates diffuse myositis. Small fluid collections are noted in the muscle 1 of which demonstrates rim enhancement after contrast consistent with pyomyositis. Do not see a well-formed discrete drainable soft tissue abscess but there is significant focal cellulitis involving the subcutaneous tissues in the right buttock area overlying the gluteus maximus.  Abnormal T2 signal intensity and enhancement in the lower sacral segments (3, 4 and 5) is highly suspicious for osteomyelitis. The coccyx segments appear intact. Both hips are normally located. No findings for septic arthritis, osteomyelitis or AVN. The pubic symphysis and SI joints are intact. No evidence of septic arthritis. Mild inflammation/edema in the right adductor muscles suggesting myositis without evidence of pyomyositis. No significant intrapelvic abnormalities are identified. The bladder is grossly normal and the uterus and ovaries are unremarkable. There is a small amount of free pelvic fluid and moderate presacral edema. No rim enhancing intrapelvic abscess is identified. IMPRESSION: 1. Suspect small open wound at the very top of the gluteal fold on the right with underlying focal cellulitis, myofasciitis involving the right gluteus maximus muscle and a small focus of pyomyositis. No drainable abscess is identified. 2. Findings suspicious for sacral  osteomyelitis. 3. No findings for septic arthritis and no intrapelvic abscess. Electronically Signed   By: Marijo Sanes M.D.   On: 06/07/2019 09:20    ROS:  Pertinent items are noted in HPI.  Blood pressure (!) 158/99, pulse (!) 131, temperature 98.7 F (37.1 C), temperature source Oral, resp. rate 15, height 5\' 9"  (1.753 m), weight 54.5 kg, SpO2 100 %, unknown if currently breastfeeding. Physical Exam: Pleasant black female no acute distress Head is normocephalic, atraumatic Lungs clear auscultation with good breath sounds bilaterally Heart examination reveals a tachycardic regular rhythm Skin examination reveals a 3 cm raised indurated area with a small amount of serous drainage present.  No purulent drainage is present.  The induration is somewhat limited and she does not appear to have significant induration deep over the muscle.  This does not extend to the perineum.  No subcutaneous emphysema is present. CT scan and MRI films personally  reviewed Assessment/Plan: Impression: Cellulitis with myositis involving the soft tissue over the sacrum.  This seems to be limited and does not appear to be a worsening necrotizing fasciitis process.  The small focus of air may be due to a tract to the skin level.  I think any acute debridement at this time is not needed.  I would like to see how this process advances.  Agree with broad antibiotic coverage including clindamycin.  Needs some resuscitation and control of her hyperglycemia.  Will follow closely with you.  This was discussed with the patient.  Aviva Signs 06/07/2019, 12:19 PM

## 2019-06-07 NOTE — Progress Notes (Addendum)
Pharmacy Antibiotic Note  Lindsey Murillo is a 24 y.o. diabetic  female admitted on 06/06/2019 with   cellulitis and possible osteomyelitis. Pharmacy has been consulted for vancomycin and cefepime dosing.  Plan: Start cefepime 2g IV q8h Loading dose:  vancomycin 1g + vancomycin 500mg  = 1.5g total Maintenance dose:  750mg  IV  q12h Goal vancomycin trough range:  15-20 mcg/mL Pharmacy will continue to monitor renal function, vancomycin troughs as clinically appropriate,  cultures and patient progress.  Height: 5\' 7"  (170.2 cm) Weight: 134 lb (60.8 kg) IBW/kg (Calculated) : 61.6  Temp (24hrs), Avg:101.1 F (38.4 C), Min:101.1 F (38.4 C), Max:101.1 F (38.4 C)  Recent Labs  Lab 06/06/19 2343 06/06/19 2345  WBC 20.9*  --   CREATININE 0.99  --   LATICACIDVEN  --  0.9    Estimated Creatinine Clearance: 84.1 mL/min (by C-G formula based on SCr of 0.99 mg/dL).    No Known Allergies  Antimicrobials this admission: ceftriaxone 1/27>> x1 dose in ED vancomycin 1/27>>  cefepime 1/28   Microbiology results: 1/27 [facility] x2:   1/27 UCx:    1/27 Resp PCR: SARS CoV-2 negative; Flu A/B negative    Thank you for allowing pharmacy to  Participate in this patient's care.  [name] 06/07/2019 1:51 AM

## 2019-06-07 NOTE — H&P (Addendum)
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  Noted to have fever, tachycardia, leukocytosis on presentation.  Lactic acid is noted to be normal.  CT of the abdomen pelvis shows inflammatory stranding involving the subcutaneous fat posterior to the sacrum with small focus of probable gas which could reflect infection with gas-forming organisms. -Plan for transfer to [place] due to lack of availability of MRI at [facility]. -We will place on broad-spectrum IV antibiotics -Plan for general surgery consult at [facility] -We will send ESR, CRP, procalcitonin -Monitor CBC, temperature -Pain meds as needed  Other Active Problems: Diabetes mellitus type 1: -Continue home dose of insulin -We will place on sliding scale insulin coverage administered monitoring  Chronic normocytic anemia: Likely anemia of chronic disease.  Hemoglobin 8.5 on presentation. Appears to have dropped significantly since early 2020.  Previous baseline was around 1213.  Currently with 8.2.  No evidence of bleeding reported by patient. -We will send anemia work-up   DVT prophylaxis: Lovenox Code Status:  Full code Family Communication: N/A  Disposition  Plan: Admitted as inpatient at [facility] due to nonavailability of MRI at [facility]. Consults called: N/A Admission status: Admit as inpatient    HPI:     HPI: Lindsey Murillo is a 24 y.o. female with medical history significant of type 1 diabetes who presented to the ER with increased back pain.  Patient states she had increased pain over the lower back and sacral region over the last 2 days.  She is had difficulty in the sitting position due to increased pain.  Today she started having a fever with chills.  Currently not on antibiotics.  Has a history of diabetes mellitus type 1 which is uncontrolled.  No prior history of spinal abscesses, osteomyelitis. ED Course:  Vital Signs reviewed on presentation, significant for temperature 101.1, heart rate 125, blood pressure 143/104, saturation 99% room air. Labs reviewed, significant for sodium 130, potassium 4.1, chloride 95, glucose 234, BUN 19, creatinine 0.9, albumin 2.0, LFTs within normal meds, lactic acid 0.9, WBC count 20.9, hemoglobin 8.5, hematocrit 26, platelets 342, pregnancy test is negative.  Flu PCR negative, SARS Covid RT-PCR negative, urinalysis negative, blood and urine cultures have been sent. Imaging personally Reviewed, CT of the abdomen pelvis with contrast shows diffuse anasarca seen throughout the external soft tissue with focal soft tissue swelling and inflammatory stranding involving the subcutaneous fat posterior to the sacrum concerning for infection/cellulitis.  Overlying skin thickening.  Inflammatory changes are fairly ill-defined at this time, most consistent with phlegmon.  No definite discrete abscess or drainable fluid collection.  Small focus of probable gas within the region which could reflect infection with gas-forming organism or possible communication with overlying skin.  Evidence for osseous erosion of the underlying  sacrum at approximately S3-S4 segment highly suggestive of osteomyelitis. EKG personally  reviewed, shows sinus tachycardia, no acute ST-T changes.    Review of systems:    Review of Systems: As per HPI otherwise 10 point review of systems negative.  All other review of systems is negative except the ones noted above in the HPI.    Past Medical and Surgical History:  Reviewed by me  Past Medical History:  Diagnosis Date  . Acanthosis nigricans, acquired   . Asthma   . Diabetic autonomic neuropathy ([place])   . Diabetic peripheral neuropathy ([place])   . Environmental allergies   . Goiter   . Hypoglycemia associated with diabetes ([place])   . Tachycardia   . Thyroiditis, autoimmune   . Type 1 diabetes mellitus in patient age 7-19 years with HbA1C goal below 7.5     Past Surgical History:  Procedure Laterality Date  . BIOPSY  08/27/2016   Procedure: BIOPSY;  Surgeon: Danie Binder, MD;  Location: [facility];  Service: Endoscopy;;  duodenum; gastric  . COLONOSCOPY    . ESOPHAGOGASTRODUODENOSCOPY N/A 08/27/2016   Dr. Oneida Alar: mild gastritis. Negative celiac. No obvious source for dyspepsia/diarrhea     Social History:  Reviewed by me   reports that she has never smoked. She has never used smokeless tobacco. She reports that she does not drink alcohol or use drugs.  Allergies:    No Known Allergies  Family History :   Family History  Problem Relation Age of Onset  . Diabetes Mother        Type II DM  . Thyroid disease Mother   . Diabetes Maternal Grandmother        Type II DM  . Diabetes Cousin        Type II DM  . Colon cancer Neg Hx   . Colon polyps Neg Hx    Family history reviewed, noted as above, not pertinent to current presentation.   Home Medications:    Prior to Admission medications   Medication Sig Start Date End Date Taking? Authorizing Provider  amoxicillin-clavulanate (AUGMENTIN) 875-125 MG tablet Take 1 tablet by mouth 2 (two) times daily. 02/27/19   Mikey Kirschner, MD  Blood Glucose Monitoring Suppl (ACCU-CHEK AVIVA) device Use as  instructed 06/03/15   Cassandria Anger, MD  ciprofloxacin (CIPRO) 500 MG tablet Take 1 tablet (500 mg total) by mouth 2 (two) times daily. 02/27/19   Mikey Kirschner, MD  Continuous Blood Gluc Sensor (FREESTYLE LIBRE 14 DAY SENSOR) MISC Inject 1 each into the skin every 14 (fourteen) days. Use as directed. 03/20/19   Cassandria Anger, MD  doxycycline (VIBRAMYCIN) 100 MG capsule Take 1 capsule (100 mg total) by mouth 2 (two) times daily. 04/13/19   [name], PA-C  glucagon 1 MG injection Follow package directions for low blood sugar. Patient taking differently: as needed. Follow package directions for low blood sugar. 01/05/12   Sherrlyn Hock, MD  ibuprofen (ADVIL,MOTRIN) 200 MG tablet Take 600 mg by mouth daily as needed.    [provider]  insulin aspart (NOVOLOG FLEXPEN) 100 UNIT/ML FlexPen Inject 8-11 Units into the skin 3 (three) times daily with meals. 03/20/19   Cassandria Anger, MD  Insulin Glargine (BASAGLAR KWIKPEN) 100 UNIT/ML SOPN Inject 0.3 mLs (30 Units total) into the skin at bedtime. 03/20/19   Cassandria Anger, MD  Vitamin D, Ergocalciferol, (DRISDOL) 1.25 MG (50000 UT) CAPS capsule Take 1 capsule (50,000 Units total)  by mouth every 7 (seven) days. 03/20/19   Cassandria Anger, MD    Physical Exam:    Physical Exam: Vitals:   06/07/19 0100 06/07/19 0200 06/07/19 0230 06/07/19 0300  BP: (!) 153/100 (!) 143/104 (!) 131/93 (!) 157/109  Pulse: (!) 130  (!) 135   Resp: 17 18 15 12   Temp:      TempSrc:      SpO2: 100%  95%   Weight:      Height:        Constitutional: NAD, calm, comfortable Vitals:   06/07/19 0100 06/07/19 0200 06/07/19 0230 06/07/19 0300  BP: (!) 153/100 (!) 143/104 (!) 131/93 (!) 157/109  Pulse: (!) 130  (!) 135   Resp: 17 18 15 12   Temp:      TempSrc:      SpO2: 100%  95%   Weight:      Height:       Eyes: PERRL, lids and conjunctivae normal ENMT: Mucous membranes are moist. Posterior pharynx clear of any  exudate or lesions.Normal dentition.  Neck: normal, supple, no masses, no thyromegaly Respiratory: clear to auscultation bilaterally, no wheezing, no crackles. Normal respiratory effort. No accessory muscle use.  Cardiovascular: Tachycardia, no murmurs / rubs / gallops. No extremity edema. 2+ pedal pulses. No carotid bruits.  Abdomen: no tenderness, no masses palpated. No hepatosplenomegaly. Bowel sounds positive.  Musculoskeletal: no clubbing / cyanosis. No joint deformity upper and lower extremities.  Swelling noted over the lower back more on the left side with tenderness over sacral region, no fluctuance, no crepitus, no open areas, no exudates. Skin: no rashes, lesions, ulcers. No induration Neurologic: CN 2-12 grossly intact. Sensation intact, DTR normal. Strength 5/5 in all 4.  Psychiatric: Normal judgment and insight. Alert and oriented x 3. Normal mood.    Decubitus Ulcers: Not present on admission Catheters and tubes: None  Data Review:    Labs on Admission: I have personally reviewed following labs and imaging studies  CBC: Recent Labs  Lab 06/06/19 2343  WBC 20.9*  HGB 8.5*  HCT 26.6*  MCV 81.1  PLT 315   Basic Metabolic Panel: Recent Labs  Lab 06/06/19 2343  NA 130*  K 4.1  CL 95*  CO2 26  GLUCOSE 234*  BUN 19  CREATININE 0.99  CALCIUM 8.3*   GFR: Estimated Creatinine Clearance: 84.1 mL/min (by C-G formula based on SCr of 0.99 mg/dL). Liver Function Tests: Recent Labs  Lab 06/06/19 2343  AST 55*  ALT 36  ALKPHOS 433*  BILITOT 0.4  PROT 8.1  ALBUMIN 2.0*   No results for input(s): LIPASE, AMYLASE in the last 168 hours. No results for input(s): AMMONIA in the last 168 hours. Coagulation Profile: No results for input(s): INR, PROTIME in the last 168 hours. Cardiac Enzymes: No results for input(s): CKTOTAL, CKMB, CKMBINDEX, TROPONINI in the last 168 hours. BNP (last 3 results) No results for input(s): PROBNP in the last 8760 hours. HbA1C: No  results for input(s): HGBA1C in the last 72 hours. CBG: No results for input(s): GLUCAP in the last 168 hours. Lipid Profile: No results for input(s): CHOL, HDL, LDLCALC, TRIG, CHOLHDL, LDLDIRECT in the last 72 hours. Thyroid Function Tests: No results for input(s): TSH, T4TOTAL, FREET4, T3FREE, THYROIDAB in the last 72 hours. Anemia Panel: No results for input(s): VITAMINB12, FOLATE, FERRITIN, TIBC, IRON, RETICCTPCT in the last 72 hours. Urine analysis:    Component Value Date/Time   COLORURINE YELLOW 06/07/2019 0212   APPEARANCEUR HAZY (  A) 06/07/2019 0212   LABSPEC 1.016 06/07/2019 0212   PHURINE 5.0 06/07/2019 0212   GLUCOSEU NEGATIVE 06/07/2019 0212   HGBUR MODERATE (A) 06/07/2019 0212   BILIRUBINUR NEGATIVE 06/07/2019 0212   KETONESUR NEGATIVE 06/07/2019 0212   PROTEINUR 100 (A) 06/07/2019 0212   UROBILINOGEN 0.2 01/07/2015 1723   NITRITE NEGATIVE 06/07/2019 0212   LEUKOCYTESUR NEGATIVE 06/07/2019 0212     Imaging Results:      Radiological Exams on Admission: CT ABDOMEN PELVIS W CONTRAST  Result Date: 06/07/2019 CLINICAL DATA:  Initial evaluation for acute abdominal abscess/infection. EXAM: CT ABDOMEN AND PELVIS WITH CONTRAST TECHNIQUE: Multidetector CT imaging of the abdomen and pelvis was performed using the standard protocol following bolus administration of intravenous contrast. CONTRAST:  154m OMNIPAQUE IOHEXOL 300 MG/ML  SOLN COMPARISON:  None. FINDINGS: Lower chest: Visualized lung bases are clear. Hepatobiliary: Liver demonstrates a normal contrast enhanced appearance. Mild diffuse periportal edema. Gallbladder contracted without abnormality. No biliary dilatation. Pancreas: Few scattered calcifications noted within the pancreas, stable. 2.2 x 4.6 cm cyst positioned just superior to the pancreatic tail again seen, unchanged. No acute peripancreatic inflammation. Spleen: Spleen within normal limits. Adrenals/Urinary Tract: Adrenal glands are normal. Kidneys equal in size  with symmetric enhancement. No nephrolithiasis, hydronephrosis or focal enhancing renal mass. No visible hydroureter. Bladder moderately distended without abnormality. Stomach/Bowel: Stomach within normal limits. No evidence for bowel obstruction. No definite acute inflammatory changes seen about the bowels. Appendix not well visualized. Vascular/Lymphatic: Normal intravascular enhancement seen throughout the intra-abdominal aorta. Mesenteric vessels patent proximally. No pathologically enlarged intra-abdominal or pelvic lymph nodes. Mildly enlarged bilateral inguinal lymph nodes measure up to 18 mm on the right and 14 mm on the left, indeterminate, but could be reactive. Reproductive: Uterus and ovaries within normal limits for age. Other: No free intraperitoneal air. Small volume free fluid present within the right lower quadrant and pelvis, nonspecific, but could be reactive and/or physiologic. Mild diffuse mesenteric edema noted. Musculoskeletal: Diffuse anasarca seen throughout the external soft tissues. There is focal soft tissue swelling and inflammatory stranding involving the subcutaneous fat posterior to the sacrum, concerning for infection/cellulitis. Overlying skin thickening. Inflammatory changes are fairly ill-defined at this time, most consistent with phlegmon. No definite discrete abscess or drainable fluid collection. There is a small focus of probable gas within this region, which could reflect infection with gas-forming organism or possibly communication with the overlying skin (series 2, image 59). No other dissecting soft tissue emphysema seen elsewhere. Additionally, evidence for osseous erosion at the underlying sacrum at approximately the S3/S4 segment, highly suggestive of associated osteomyelitis. IMPRESSION: 1. Soft tissue swelling with inflammatory stranding involving the subcutaneous fat posterior to the sacrum, concerning for infection/cellulitis. Small focus of gas within this region  could reflect changes related to an aggressive soft tissue infection with gas-forming organism or possibly communication with the overlying skin (series 2, image 59). Correlation with physical exam for possible ulceration and/or sinus tract recommended. 2. Associated osseous erosion at the underlying sacrum at approximately the S3/S4 segment, highly suggestive of associated osteomyelitis. 3. Small volume free fluid within the right lower quadrant, nonspecific, but could be reactive and/or physiologic. 4. Mildly enlarged bilateral inguinal lymph nodes, nonspecific, but may be reactive. 5. Atrophic pancreas with scattered calcifications, consistent with chronic pancreatitis. 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 Electronically Signed   By: BJeannine BogaM.D.   On: 06/07/2019 01:59      S[facility] CfMGinette OttoMD [facility]  If 7PM-7AM, please contact night-coverage   06/07/2019, 3:19 AM

## 2019-06-07 NOTE — ED Provider Notes (Signed)
[facility] EMERGENCY DEPARTMENT Provider Note   CSN: [id number] Arrival date & time: 06/06/19  2154     History Chief Complaint  Patient presents with  . Back Pain    Lindsey Murillo is a 24 y.o. female.  Patient with history of uncontrolled diabetes, neuropathy, noncompliance presents with worsening lower back pain near sacrum and pain with sitting.  Patient denies history of similar.  Patient denies abscess in the past.  Patient said her sugars are normally controlled however does know specific numbers.  No current antibiotics.  Fever started today.  Patient does not have a history of spine abscesses or spine surgery mild scoliosis history.        Past Medical History:  Diagnosis Date  . Acanthosis nigricans, acquired   . Asthma   . Diabetic autonomic neuropathy ([place])   . Diabetic peripheral neuropathy ([place])   . Environmental allergies   . Goiter   . Hypoglycemia associated with diabetes ([place])   . Tachycardia   . Thyroiditis, autoimmune   . Type 1 diabetes mellitus in patient age 88-19 years with HbA1C goal below 7.5     Patient Active Problem List   Diagnosis Date Noted  . Osteomyelitis ([place]) 06/07/2019  . Vitamin D deficiency 03/20/2019  . Dyspepsia   . Weight loss, unintentional 08/26/2016  . Chronic diarrhea 04/09/2016  . 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 Noncompliance 02/26/2014  . Essential hypertension, benign 11/30/2012  . Acanthosis nigricans, acquired   . Hypoglycemia associated with diabetes ([place])   . Goiter   . Tachycardia   . Diabetic autonomic neuropathy ([place])   . Diabetic peripheral neuropathy ([place])   . Thyroiditis, autoimmune   . Asthma   . Environmental allergies     Past Surgical History:  Procedure Laterality Date  . BIOPSY  08/27/2016   Procedure: BIOPSY;  Surgeon: Danie Binder, MD;  Location: [facility];  Service: Endoscopy;;  duodenum; gastric  . COLONOSCOPY    .  ESOPHAGOGASTRODUODENOSCOPY N/A 08/27/2016   Dr. Oneida Alar: mild gastritis. Negative celiac. No obvious source for dyspepsia/diarrhea     OB History    Gravida  1   Para      Term      Preterm      AB      Living        SAB      TAB      Ectopic      Multiple      Live Births              Family History  Problem Relation Age of Onset  . Diabetes Mother        Type II DM  . Thyroid disease Mother   . Diabetes Maternal Grandmother        Type II DM  . Diabetes Cousin        Type II DM  . Colon cancer Neg Hx   . Colon polyps Neg Hx     Social History   Tobacco Use  . Smoking status: Never Smoker  . Smokeless tobacco: Never Used  Substance Use Topics  . Alcohol use: No  . Drug use: No    Home Medications Prior to Admission medications   Medication Sig Start Date End Date Taking? Authorizing Provider  amoxicillin-clavulanate (AUGMENTIN) 875-125 MG tablet Take 1 tablet by mouth 2 (two) times daily. 02/27/19   Mikey Kirschner, MD  Blood Glucose  Monitoring Suppl (ACCU-CHEK AVIVA) device Use as instructed 06/03/15   Cassandria Anger, MD  ciprofloxacin (CIPRO) 500 MG tablet Take 1 tablet (500 mg total) by mouth 2 (two) times daily. 02/27/19   Mikey Kirschner, MD  Continuous Blood Gluc Sensor (FREESTYLE LIBRE 14 DAY SENSOR) MISC Inject 1 each into the skin every 14 (fourteen) days. Use as directed. 03/20/19   Cassandria Anger, MD  doxycycline (VIBRAMYCIN) 100 MG capsule Take 1 capsule (100 mg total) by mouth 2 (two) times daily. 04/13/19   [name], PA-C  glucagon 1 MG injection Follow package directions for low blood sugar. Patient taking differently: as needed. Follow package directions for low blood sugar. 01/05/12   Sherrlyn Hock, MD  ibuprofen (ADVIL,MOTRIN) 200 MG tablet Take 600 mg by mouth daily as needed.    [provider]  insulin aspart (NOVOLOG FLEXPEN) 100 UNIT/ML FlexPen Inject 8-11 Units into the skin 3 (three) times daily  with meals. 03/20/19   Cassandria Anger, MD  Insulin Glargine (BASAGLAR KWIKPEN) 100 UNIT/ML SOPN Inject 0.3 mLs (30 Units total) into the skin at bedtime. 03/20/19   Cassandria Anger, MD  Vitamin D, Ergocalciferol, (DRISDOL) 1.25 MG (50000 UT) CAPS capsule Take 1 capsule (50,000 Units total) by mouth every 7 (seven) days. 03/20/19   Cassandria Anger, MD    Allergies    Patient has no known allergies.  Review of Systems   Review of Systems  Constitutional: Positive for appetite change and fever. Negative for chills.  HENT: Negative for congestion.   Eyes: Negative for visual disturbance.  Respiratory: Negative for shortness of breath.   Cardiovascular: Negative for chest pain.  Gastrointestinal: Negative for abdominal pain and vomiting.  Genitourinary: Negative for dysuria and flank pain.  Musculoskeletal: Positive for back pain and gait problem. Negative for neck pain and neck stiffness.  Skin: Positive for rash.  Neurological: Positive for light-headedness and numbness (chronic neuropathy). Negative for weakness and headaches.    Physical Exam Updated Vital Signs BP 113/69   Pulse (!) 126   Temp 99 F (37.2 C) (Oral)   Resp 19   Ht 5\' 7"  (1.702 m)   Wt 60.8 kg   SpO2 99%   BMI 20.99 kg/m   Physical Exam Vitals and nursing note reviewed.  Constitutional:      Appearance: She is well-developed.  HENT:     Head: Normocephalic and atraumatic.  Eyes:     General:        Right eye: No discharge.        Left eye: No discharge.     Conjunctiva/sclera: Conjunctivae normal.  Neck:     Trachea: No tracheal deviation.  Cardiovascular:     Rate and Rhythm: Normal rate and regular rhythm.  Pulmonary:     Effort: Pulmonary effort is normal.     Breath sounds: Normal breath sounds.  Abdominal:     General: There is no distension.     Palpations: Abdomen is soft.     Tenderness: There is no abdominal tenderness. There is no guarding.  Musculoskeletal:      Cervical back: Normal range of motion and neck supple.  Skin:    General: Skin is warm.     Findings: No rash.  Neurological:     General: No focal deficit present.     Mental Status: She is alert and oriented to person, place, and time.     Comments: Patient has general weakness on exam.  No focal weakness in legs with flexion extension of hips knees and ankles bilateral.  Sensation intact however decreased distally chronic neuropathy.  Patient has chronic wound and bandage removed in the right lower extremity.  No sign of active infection right lower extremity.     ED Results / Procedures / Treatments   Labs (all labs ordered are listed, but only abnormal results are displayed) Labs Reviewed  COMPREHENSIVE METABOLIC PANEL - Abnormal; Notable for the following components:      Result Value   Sodium 130 (*)    Chloride 95 (*)    Glucose, Bld 234 (*)    Calcium 8.3 (*)    Albumin 2.0 (*)    AST 55 (*)    Alkaline Phosphatase 433 (*)    All other components within normal limits  CBC - Abnormal; Notable for the following components:   WBC 20.9 (*)    RBC 3.28 (*)    Hemoglobin 8.5 (*)    HCT 26.6 (*)    MCH 25.9 (*)    All other components within normal limits  URINALYSIS, ROUTINE W REFLEX MICROSCOPIC - Abnormal; Notable for the following components:   APPearance HAZY (*)    Hgb urine dipstick MODERATE (*)    Protein, ur 100 (*)    All other components within normal limits  BASIC METABOLIC PANEL - Abnormal; Notable for the following components:   Sodium 130 (*)    Chloride 95 (*)    Glucose, Bld 231 (*)    Calcium 7.9 (*)    All other components within normal limits  CBC - Abnormal; Notable for the following components:   WBC 20.2 (*)    RBC 3.51 (*)    Hemoglobin 9.4 (*)    HCT 28.6 (*)    All other components within normal limits  SEDIMENTATION RATE - Abnormal; Notable for the following components:   Sed Rate 140 (*)    All other components within normal limits    C-REACTIVE PROTEIN - Abnormal; Notable for the following components:   CRP 13.2 (*)    All other components within normal limits  CBG MONITORING, ED - Abnormal; Notable for the following components:   Glucose-Capillary 204 (*)    All other components within normal limits  RESPIRATORY PANEL BY RT PCR (FLU A&B, COVID)  URINE CULTURE  CULTURE, BLOOD (ROUTINE X 2)  CULTURE, BLOOD (ROUTINE X 2)  PREGNANCY, URINE  LACTIC ACID, PLASMA  LACTIC ACID, PLASMA  PROCALCITONIN  HIV ANTIBODY (ROUTINE TESTING W REFLEX)  VITAMIN B12  FOLATE  IRON AND TIBC  FERRITIN  RETICULOCYTES  POC URINE PREG, ED    EKG None  Radiology CT ABDOMEN PELVIS W CONTRAST  Result Date: 06/07/2019 CLINICAL DATA:  Initial evaluation for acute abdominal abscess/infection. EXAM: CT ABDOMEN AND PELVIS WITH CONTRAST TECHNIQUE: Multidetector CT imaging of the abdomen and pelvis was performed using the standard protocol following bolus administration of intravenous contrast. CONTRAST:  145mL OMNIPAQUE IOHEXOL 300 MG/ML  SOLN COMPARISON:  None. FINDINGS: Lower chest: Visualized lung bases are clear. Hepatobiliary: Liver demonstrates a normal contrast enhanced appearance. Mild diffuse periportal edema. Gallbladder contracted without abnormality. No biliary dilatation. Pancreas: Few scattered calcifications noted within the pancreas, stable. 2.2 x 4.6 cm cyst positioned just superior to the pancreatic tail again seen, unchanged. No acute peripancreatic inflammation. Spleen: Spleen within normal limits. Adrenals/Urinary Tract: Adrenal glands are normal. Kidneys equal in size with symmetric enhancement. No nephrolithiasis, hydronephrosis or focal enhancing renal mass. No visible  hydroureter. Bladder moderately distended without abnormality. Stomach/Bowel: Stomach within normal limits. No evidence for bowel obstruction. No definite acute inflammatory changes seen about the bowels. Appendix not well visualized. Vascular/Lymphatic: Normal  intravascular enhancement seen throughout the intra-abdominal aorta. Mesenteric vessels patent proximally. No pathologically enlarged intra-abdominal or pelvic lymph nodes. Mildly enlarged bilateral inguinal lymph nodes measure up to 18 mm on the right and 14 mm on the left, indeterminate, but could be reactive. Reproductive: Uterus and ovaries within normal limits for age. Other: No free intraperitoneal air. Small volume free fluid present within the right lower quadrant and pelvis, nonspecific, but could be reactive and/or physiologic. Mild diffuse mesenteric edema noted. Musculoskeletal: Diffuse anasarca seen throughout the external soft tissues. There is focal soft tissue swelling and inflammatory stranding involving the subcutaneous fat posterior to the sacrum, concerning for infection/cellulitis. Overlying skin thickening. Inflammatory changes are fairly ill-defined at this time, most consistent with phlegmon. No definite discrete abscess or drainable fluid collection. There is a small focus of probable gas within this region, which could reflect infection with gas-forming organism or possibly communication with the overlying skin (series 2, image 59). No other dissecting soft tissue emphysema seen elsewhere. Additionally, evidence for osseous erosion at the underlying sacrum at approximately the S3/S4 segment, highly suggestive of associated osteomyelitis. IMPRESSION: 1. Soft tissue swelling with inflammatory stranding involving the subcutaneous fat posterior to the sacrum, concerning for infection/cellulitis. Small focus of gas within this region could reflect changes related to an aggressive soft tissue infection with gas-forming organism or possibly communication with the overlying skin (series 2, image 59). Correlation with physical exam for possible ulceration and/or sinus tract recommended. 2. Associated osseous erosion at the underlying sacrum at approximately the S3/S4 segment, highly suggestive of  associated osteomyelitis. 3. Small volume free fluid within the right lower quadrant, nonspecific, but could be reactive and/or physiologic. 4. Mildly enlarged bilateral inguinal lymph nodes, nonspecific, but may be reactive. 5. Atrophic pancreas with scattered calcifications, consistent with chronic pancreatitis. 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 Electronically Signed   By: Jeannine Boga M.D.   On: 06/07/2019 01:59    Procedures .Critical Care Performed by: Elnora Morrison, MD Authorized by: Elnora Morrison, MD   Critical care provider statement:    Critical care time (minutes):  45   Critical care start time:  06/07/2019 1:45 AM   Critical care end time:  06/07/2019 3:00 AM   Critical care was time spent personally by me on the following activities:  Discussions with consultants, evaluation of patient's response to treatment, examination of patient, ordering and performing treatments and interventions, ordering and review of laboratory studies, ordering and review of radiographic studies, pulse oximetry, re-evaluation of patient's condition, obtaining history from patient or surrogate and review of old charts   (including critical care time)  Medications Ordered in ED Medications  vancomycin (VANCOREADY) IVPB 750 mg/150 mL (has no administration in time range)  enoxaparin (LOVENOX) injection 40 mg (has no administration in time range)  lactated ringers infusion ( Intravenous Rate/Dose Change 06/07/19 0345)  sodium chloride flush (NS) 0.9 % injection 3 mL (3 mLs Intravenous Not Given 06/07/19 0345)  sodium chloride flush (NS) 0.9 % injection 3 mL (has no administration in time range)  0.9 %  sodium chloride infusion (has no administration in time range)  acetaminophen (TYLENOL) tablet 650 mg (has no administration in time range)    Or  acetaminophen (TYLENOL) suppository 650 mg (has no administration  in time range)  traMADol  (ULTRAM) tablet 50 mg (has no administration in time range)  polyethylene glycol (MIRALAX / GLYCOLAX) packet 17 g (has no administration in time range)  bisacodyl (DULCOLAX) suppository 10 mg (has no administration in time range)  ondansetron (ZOFRAN) tablet 4 mg (has no administration in time range)    Or  ondansetron (ZOFRAN) injection 4 mg (has no administration in time range)  morphine 2 MG/ML injection 1 mg (has no administration in time range)  ceFEPIme (MAXIPIME) 2 g in sodium chloride 0.9 % 100 mL IVPB (0 g Intravenous Stopped 06/07/19 0641)  insulin aspart (novoLOG) injection 0-5 Units (has no administration in time range)  insulin aspart (novoLOG) injection 0-15 Units (has no administration in time range)  insulin glargine (LANTUS) injection 30 Units (has no administration in time range)  clindamycin (CLEOCIN) IVPB 600 mg (has no administration in time range)  sodium chloride 0.9 % bolus 500 mL (has no administration in time range)  acetaminophen (TYLENOL) tablet 1,000 mg (1,000 mg Oral Given 06/06/19 2343)  sodium chloride 0.9 % bolus 1,000 mL (0 mLs Intravenous Stopped 06/07/19 0122)  vancomycin (VANCOCIN) IVPB 1000 mg/200 mL premix (0 mg Intravenous Stopped 06/07/19 0108)  cefTRIAXone (ROCEPHIN) 2 g in sodium chloride 0.9 % 100 mL IVPB (0 g Intravenous Stopped 06/07/19 0042)  vancomycin (VANCOREADY) IVPB 500 mg/100 mL (0 mg Intravenous Stopped 06/07/19 0313)  iohexol (OMNIPAQUE) 300 MG/ML solution 100 mL (100 mLs Intravenous Contrast Given 06/07/19 0101)  clindamycin (CLEOCIN) IVPB 600 mg (0 mg Intravenous Stopped 06/07/19 0254)  lactated ringers bolus 1,000 mL (0 mLs Intravenous Stopped 06/07/19 0346)    ED Course  I have reviewed the triage vital signs and the nursing notes.  Pertinent labs & imaging results that were available during my care of the patient were reviewed by me and considered in my medical decision making (see chart for details).    MDM Rules/Calculators/A&P                       Final Clinical Impression(s) / ED Diagnoses Final diagnoses:  Cellulitis of sacral region  Sepsis due to cellulitis [facility])   Patient presents to emergency department with worsening sacral pain for 2 days and on arrival it was discovered she had a fever.  Patient has no significant external abscess on exam, scarring in that area noted however patient denies ever having a procedure or surgery there.  With fever, tachycardia and source of infection broad antibiotics ordered and cultures.  Lactate normal.  IV fluid bolus given.  Sepsis screening performed.  Patient improved pain on reassessment.  CT scan obtained for further delineation and results reviewed and updated patient on concern of osteomyelitis and small abscess/cellulitis.  Patient has no neuro deficits at this time.  Discussed the case with Dr. Dema Severin surgery on-call at [facility] and he reviewed the CT and does not feel there is indication for emergent transfer or emergent MRI at this time and that the local surgeon can evaluate the patient.  Discussed case with the hospitalist who feels an MRI would be helpful and is planning on transferring to [facility] to the other hospitalist.  Patient care will be signed out to continue to monitor until patient is admitted.  Local surgeon Dr. Arnoldo Morale was consulted for assessment recommendations. Blood work reviewed lactate normal, white blood cell count 20.9, sodium 130, chloride 95, hemoglobin 8.5.  Inflammatory markers significantly elevated.  Repeat IV fluids given.  Covid  test was sent.  Annam Henniger Filley was evaluated in Emergency Department on 06/07/2019 for the symptoms described in the history of present illness. She was evaluated in the context of the global COVID-19 pandemic, which necessitated consideration that the patient might be at risk for infection with the SARS-CoV-2 virus that causes COVID-19. Institutional protocols and algorithms that pertain to the evaluation of  patients at risk for COVID-19 are in a state of rapid change based on information released by regulatory bodies including the CDC and federal and state organizations. These policies and algorithms were followed during the patient's care in the ED.  Rx / DC Orders ED Discharge Orders    None       Elnora Morrison, MD 06/07/19 [phone]

## 2019-06-07 NOTE — Progress Notes (Signed)
YUMA, BLUCHER ([id number]) Visit Report for 06/05/2019 Arrival Information Details Patient Name: Date of Service: Lindsey Murillo, Lindsey Murillo 06/05/2019 3:45 PM Medical Record [id number] Patient Account Number: [id number] Date of Birth/Sex: Treating RN: 1996-04-05 (24 y.o. Lindsey Murillo, Linda Primary Care Jaxden Blyden: Baltazar Apo Other Clinician: Referring Pearline Yerby: Treating Abygayle Deltoro/Extender:Robson, Evlyn Kanner, [place] Dom in Treatment: 11 Visit Information History Since Last Visit Added or deleted any medications: No Patient Arrived: Ambulatory Any new allergies or adverse reactions: No Arrival Time: 16:06 Had a fall or experienced change in No Accompanied By: self activities of daily living that may affect Transfer Assistance: None risk of falls: Patient Identification Verified: Yes Signs or symptoms of abuse/neglect since last No Secondary Verification Process Completed: Yes visito Patient Requires Transmission-Based No Hospitalized since last visit: No Precautions: Implantable device outside of the clinic excluding No Patient Has Alerts: No cellular tissue based products placed in the center since last visit: Has Dressing in Place as Prescribed: Yes Has Compression in Place as Prescribed: Yes Pain Present Now: Yes Electronic Signature(s) Signed: 06/05/2019 6:19:30 PM By: Baruch Gouty RN, BSN Entered By: Baruch Gouty on 06/05/2019 16:08:29 -------------------------------------------------------------------------------- Compression Therapy Details Patient Name: Date of Service: Lindsey Murillo 06/05/2019 3:45 PM Medical Record [id number] Patient Account Number: [id number] Date of Birth/Sex: Treating RN: [birth year] (24 y.o. Lindsey Murillo Primary Care Bertrand Vowels: Baltazar Apo Other Clinician: Referring Avonell Lenig: Treating Nichael Ehly/Extender:Robson, Evlyn Kanner, [place] Dom in Treatment: 11 Compression Therapy Performed for Wound  Wound #4 Right,Distal,Plantar Foot Assessment: Performed By: Jake Church, RN Compression Type: Three Layer Post Procedure Diagnosis Same as Pre-procedure Electronic Signature(s) Signed: 06/06/2019 7:19:07 AM By: Carlene Coria RN Entered By: Carlene Coria on 06/05/2019 16:48:25 -------------------------------------------------------------------------------- Encounter Discharge Information Details Patient Name: Date of Service: Lindsey Murillo. 06/05/2019 3:45 PM Medical Record [id number] Patient Account Number: [id number] Date of Birth/Sex: Treating RN: 07-Jun-1995 (24 y.o. Lindsey Murillo Primary Care Linnie Mcglocklin: Baltazar Apo Other Clinician: Referring Sovereign Ramiro: Treating Aurelio Mccamy/Extender:Robson, Evlyn Kanner, [place] Dom in Treatment: 11 Encounter Discharge Information Items Discharge Condition: Stable Ambulatory Status: Ambulatory Discharge Destination: Home Transportation: Private Auto Accompanied By: self Schedule Follow-up Appointment: Yes Clinical Summary of Care: Patient Declined Electronic Signature(s) Signed: 06/07/2019 7:44:20 AM By: Kela Millin Entered By: Kela Millin on 06/05/2019 17:29:09 -------------------------------------------------------------------------------- Lower Extremity Assessment Details Patient Name: Date of Service: Lindsey Murillo 06/05/2019 3:45 PM Medical Record [id number] Patient Account Number: [id number] Date of Birth/Sex: Treating RN: 1996-04-21 (24 y.o. Lindsey Murillo Primary Care Aerielle Stoklosa: Baltazar Apo Other Clinician: Referring Kaslyn Richburg: Treating Delaila Nand/Extender:Robson, Evlyn Kanner, [place] Dom in Treatment: 11 Edema Assessment Assessed: [Left: No] [Right: No] Edema: [Left: Ye] [Right: s] Calf Left: Right: Point of Measurement: cm From Medial Instep cm 27.2 cm Ankle Left: Right: Point of Measurement: cm From Medial Instep cm 20.8 cm Vascular  Assessment Pulses: Dorsalis Pedis Palpable: [Right:Yes] Electronic Signature(s) Signed: 06/05/2019 6:19:30 PM By: Baruch Gouty RN, BSN Entered By: Baruch Gouty on 06/05/2019 16:18:18 -------------------------------------------------------------------------------- Multi Wound Chart Details Patient Name: Date of Service: Lindsey Murillo 06/05/2019 3:45 PM Medical Record [id number] Patient Account Number: [id number] Date of Birth/Sex: Treating RN: [birth year] (24 y.o. 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] Comorbid History: [  4:Anemia, Type I Diabetes N/A] Date Acquired: [4:01/09/2019] [N/A:N/A] Weeks of Treatment: [4:11] [N/A:N/A] Wound Status: [4:Open] [N/A:N/A] Measurements L x W x D 0.2x0.2x0.5 [N/A:N/A] (cm) Area (cm) : [4:0.031] [N/A:N/A] Volume (cm) : [4:0.016] [N/A:N/A] % Reduction in Area: [4:99.40%] [N/A:N/A] % Reduction in Volume: 98.50% [N/A:N/A] Classification: [4:Grade 3] [N/A:N/A] Exudate Amount: [4:Small] [N/A:N/A] Exudate Type: [4:Sanguinous] [N/A:N/A] Exudate Color: [4:red] [N/A:N/A] Wound Margin: [4:Well defined, not attached] [N/A:N/A] Granulation Amount: [4:None Present (0%)] [N/A:N/A] Necrotic Amount: [4:None Present (0%)] [N/A:N/A] Exposed Structures: [4:Fat Layer (Subcutaneous Tissue) Exposed: Yes Fascia: No Tendon: No Muscle: No Joint: No Bone: No] [N/A:N/A] Epithelialization: [4:Small (1-33%)] [N/A:N/A] Assessment Notes: [4:unable to  visualize wound bed Compression Therapy] [N/A:N/A N/A] Treatment Notes Electronic Signature(s) Signed: 06/05/2019 5:32:11 PM By: Linton Ham MD Entered By: Linton Ham on 06/05/2019 16:50:33 -------------------------------------------------------------------------------- Multi-Disciplinary Care Plan Details Patient Name: Date of Service: Lindsey Murillo. 06/05/2019 3:45 PM Medical Record [id number] Patient Account Number: [id number] Date of Birth/Sex: Treating RN: 09-Jul-1995 (24 y.o. Lindsey Murillo Primary Care Emmanuelle Hibbitts: Baltazar Apo Other Clinician: Referring Kynadi Dragos: Treating Deoni Cosey/Extender:Robson, Evlyn Kanner, [place] Dom in Treatment: 11 Active Inactive Wound/Skin Impairment Nursing Diagnoses: Knowledge deficit related to ulceration/compromised skin integrity Goals: Patient/caregiver will verbalize understanding of skin care regimen Date Initiated: 03/20/2019 Target Resolution Date: 06/29/2019 Goal Status: Active Ulcer/skin breakdown will have a volume reduction of 30% by week 4 Date Initiated: 03/20/2019 Date Inactivated: 04/24/2019 Target12/03/2019 Resolution Date: Unmet Goal Status: Unmet Reason: COMORBITIES/ NON COMPLAINCE Ulcer/skin breakdown will have a volume reduction of 50% by week 8 Date Initiated: 04/24/2019 Date Inactivated: 05/29/2019 Target Resolution Date: 05/25/2019 Goal Status: Met Ulcer/skin breakdown will have a volume reduction of 80% by week 12 Date Initiated: 05/29/2019 Target Resolution Date: 06/29/2019 Goal Status: Active Interventions: Assess patient/caregiver ability to obtain necessary supplies Assess patient/caregiver ability to perform ulcer/skin care regimen upon admission and as needed Assess ulceration(s) every visit Notes: Electronic Signature(s) Signed: 06/06/2019 7:19:07 AM By: Carlene Coria RN Entered By: Carlene Coria on 06/05/2019  16:07:21 -------------------------------------------------------------------------------- Pain Assessment Details Patient Name: Date of Service: CEILI, BOSHERS 06/05/2019 3:45 PM Medical Record [id number] Patient Account Number: [id number] Date of Birth/Sex: Treating RN: 1995/08/07 (24 y.o. Lindsey Murillo Primary Care Anmol Fleck: Baltazar Apo Other Clinician: Referring Janesha Brissette: Treating Dionicia Cerritos/Extender:Robson, Evlyn Kanner, [place] Dom in Treatment: 11 Active Problems Location of Pain Severity and Description of Pain Patient Has Paino Yes Site Locations Rate the pain. Current Pain Level: 7 Pain Management and Medication Current Pain Management: Notes c/o right side gluteus pain Electronic Signature(s) Signed: 06/05/2019 6:19:30 PM By: Baruch Gouty RN, BSN Entered By: Baruch Gouty on 06/05/2019 16:11:07 -------------------------------------------------------------------------------- Patient/Caregiver Education Details Custis, Velena 1/26/2021andnbsp3:45 Patient Name: Date of Service: [name] Medical Record Patient Account Number: [id number] Number: Treating RN: Carlene Coria Date of Birth/Gender: 12-22-1995 (23 y.o. F) Other Clinician: Primary Care Physician: Laverta Baltimore Referring Physician: Physician/Extender: Marylin Crosby in Treatment: 11 Education Assessment Education Provided To: Patient Education Topics Provided Wound/Skin Impairment: Methods: Explain/Verbal Responses: State content correctly Electronic Signature(s) Signed: 06/06/2019 7:19:07 AM By: Carlene Coria RN Entered By: Carlene Coria on 06/05/2019 16:08:08 -------------------------------------------------------------------------------- Wound Assessment Details Patient Name: Date of Service: LYNAE, PEDERSON 06/05/2019 3:45 PM Medical Record [id number] Patient Account Number: [id number] Date of Birth/Sex: Treating  RN: March 24, 1996 (24 y.o. F) Primary Care Bray Vickerman: Baltazar Apo Other Clinician: Referring Reiley Keisler: Treating Eoghan Belcher/Extender:Robson, Evlyn Kanner, [place] Dom in Treatment: 11 Wound Status Wound Number: 4 Primary Diabetic Wound/Ulcer of the Lower Etiology: Extremity Wound Location: Right, Distal,  Plantar Foot Wound Status: Open Wounding Event: Gradually Appeared Comorbid Anemia, Type I Diabetes Date Acquired: 01/09/2019 History: Weeks Of Treatment: 11 Clustered Wound: No Photos Wound Measurements Length: (cm) 0.2 % Reduc Width: (cm) 0.2 % Reduc Depth: (cm) 0.5 Epithel Area: (cm) 0.031 Tunnel Volume: (cm) 0.016 Underm Wound Description Classification: Grade 3 Wound Margin: Well defined, not attached Exudate Amount: Small Exudate Type: Sanguinous Exudate Color: red Wound Bed Granulation Amount: None Present (0%) Necrotic Amount: None Present (0%) Foul Odor After Cleansing: No Slough/Fibrino No Exposed Structure Fascia Exposed: No Fat Layer (Subcutaneous Tissue) Exposed: Yes Tendon Exposed: No Muscle Exposed: No Joint Exposed: No Bone Exposed: No tion in Area: 99.4% tion in Volume: 98.5% ialization: Small (1-33%) ing: No ining: No Assessment Notes unable to visualize wound bed Treatment Notes Wound #4 (Right, Distal, Plantar Foot) 1. Cleanse With Wound Cleanser Soap and water 2. Periwound Care Moisturizing lotion 3. Primary Dressing Applied Iodoflex 4. Secondary Dressing Dry Gauze 6. Support Layer Applied 3 layer compression wrap Electronic Signature(s) Signed: 06/06/2019 4:33:34 PM By: Mikeal Hawthorne EMT/HBOT Previous Signature: 06/05/2019 6:19:30 PM Version By: Baruch Gouty RN, BSN Entered By: Mikeal Hawthorne on 06/06/2019 13:11:49 -------------------------------------------------------------------------------- Vitals Details Patient Name: Date of Service: Lindsey Murillo. 06/05/2019 3:45 PM Medical Record [id number] Patient  Account Number: [id number] Date of Birth/Sex: Treating RN: [birth year] (24 y.o. Lindsey Murillo Primary Care Leonore Frankson: Baltazar Apo Other Clinician: Referring Hilberto Burzynski: Treating Valkyrie Guardiola/Extender:Robson, Evlyn Kanner, [place] Dom in Treatment: 11 Vital Signs Time Taken: 16:08 Temperature (F): 98.6 Height (in): 68 Pulse (bpm): 124 Source: Stated Respiratory Rate (breaths/min): 18 Weight (lbs): 137 Blood Pressure (mmHg): 156/102 Source: Stated Reference Range: 80 - 120 mg / dl Body Mass Index (BMI): 20.8 Notes pt does not recall glucose reading from last night Electronic Signature(s) Signed: 06/05/2019 6:19:30 PM By: Baruch Gouty RN, BSN Entered By: Baruch Gouty on 06/05/2019 16:09:30

## 2019-06-07 NOTE — ED Notes (Signed)
Pt c/o of blurred vision I notified dr Manuella Ghazi and I will continue to monitor it

## 2019-06-08 LAB — CBC
HCT: 23.8 % — ABNORMAL LOW (ref 36.0–46.0)
Hemoglobin: 7.8 g/dL — ABNORMAL LOW (ref 12.0–15.0)
MCH: 26.7 pg (ref 26.0–34.0)
MCHC: 32.8 g/dL (ref 30.0–36.0)
MCV: 81.5 fL (ref 80.0–100.0)
Platelets: 308 10*3/uL (ref 150–400)
RBC: 2.92 MIL/uL — ABNORMAL LOW (ref 3.87–5.11)
RDW: 14.1 % (ref 11.5–15.5)
WBC: 22 10*3/uL — ABNORMAL HIGH (ref 4.0–10.5)
nRBC: 0 % (ref 0.0–0.2)

## 2019-06-08 LAB — GLUCOSE, CAPILLARY
Glucose-Capillary: 116 mg/dL — ABNORMAL HIGH (ref 70–99)
Glucose-Capillary: 84 mg/dL (ref 70–99)
Glucose-Capillary: 76 mg/dL (ref 70–99)
Glucose-Capillary: 75 mg/dL (ref 70–99)

## 2019-06-08 LAB — BASIC METABOLIC PANEL
Anion gap: 11 (ref 5–15)
BUN: 19 mg/dL (ref 6–20)
CO2: 24 mmol/L (ref 22–32)
Calcium: 7.8 mg/dL — ABNORMAL LOW (ref 8.9–10.3)
Chloride: 97 mmol/L — ABNORMAL LOW (ref 98–111)
Creatinine, Ser: 0.76 mg/dL (ref 0.44–1.00)
GFR calc Af Amer: >60 mL/min (ref >60)
GFR calc non Af Amer: >60 mL/min (ref >60)
Glucose, Bld: 134 mg/dL — ABNORMAL HIGH (ref 70–99)
Potassium: 3.9 mmol/L (ref 3.5–5.1)
Sodium: 132 mmol/L — ABNORMAL LOW (ref 135–145)

## 2019-06-08 LAB — URINE CULTURE

## 2019-06-08 LAB — MRSA PCR SCREENING: MRSA by PCR: NEGATIVE

## 2019-06-08 MED ORDER — INSULIN ASPART 100 UNIT/ML ~~LOC~~ SOLN
6.0000 [IU] | Freq: Three times a day (TID) | SUBCUTANEOUS | Status: DC
  Administered 2019-06-08 – 2019-06-09 (×6): 6 [IU] via SUBCUTANEOUS

## 2019-06-08 MED ORDER — SODIUM CHLORIDE 0.9 % IV SOLN: INTRAVENOUS | Status: DC

## 2019-06-08 NOTE — Progress Notes (Signed)
CRITICAL VALUE ALERT  Critical Value:  Anerobic blood culture gram positive cocci  Date & Time Notied:  06/08/2019 0741  Provider Notified: Manuella Ghazi  Orders Received/Actions taken: Paged MD, awaiting orders

## 2019-06-08 NOTE — Progress Notes (Signed)
Subjective: Day 1 of Vancomycin, Cefepime, and Clindamycin for soft tissue infection. She slept well but has some discomfort with her positioning due to sacral lesion. She denies subjective fevers or chills. She has been voiding well. No bowel movement overnight. She denies shortness of breath  Objective: Vital signs in last 24 hours: Temp:  [98.2 F (36.8 C)-98.7 F (37.1 C)] 98.5 F (36.9 C) (01/29 0400) Pulse Rate:  [62-134] 62 (01/29 0600) Resp:  [10-25] 16 (01/29 0600) BP: (85-158)/(61-102) 135/88 (01/29 0600) SpO2:  [97 %-100 %] 99 % (01/29 0600) Weight:  [54.1 kg-54.5 kg] 54.1 kg (01/29 0500) Last BM Date: 06/07/19  Intake/Output from previous day: 01/28 0701 - 01/29 0700 In: 3953.5 [P.O.:250; I.V.:3253.6; IV Piggyback:449.9] Out: -  Intake/Output this shift: No intake/output data recorded.  General appearance: alert, cooperative, no distress and lying on right side in bed with multiple blankets Resp: clear to auscultation bilaterally Cardio: regular rate and rhythm, S1, S2 normal, no murmur, click, rub or gallop Skin: Erythematous raised macular lesion on right buttock with induration but no fluctuance  Lab Results:  Recent Labs    06/07/19 0606 06/08/19 0831  WBC 20.2* 22.0*  HGB 9.4* 7.8*  HCT 28.6* 23.8*  PLT 288 308   BMET Recent Labs    06/07/19 0606 06/08/19 0831  NA 130* 132*  K 3.9 3.9  CL 95* 97*  CO2 25 24  GLUCOSE 231* 134*  BUN 17 19  CREATININE 0.80 0.76  CALCIUM 7.9* 7.8*   PT/INR No results for input(s): LABPROT, INR in the last 72 hours.  Studies/Results: CT ABDOMEN PELVIS W CONTRAST  Result Date: 06/07/2019 CLINICAL DATA:  Initial evaluation for acute abdominal abscess/infection. EXAM: CT ABDOMEN AND PELVIS WITH CONTRAST TECHNIQUE: Multidetector CT imaging of the abdomen and pelvis was performed using the standard protocol following bolus administration of intravenous contrast. CONTRAST:  160mL OMNIPAQUE IOHEXOL 300 MG/ML  SOLN  COMPARISON:  None. FINDINGS: Lower chest: Visualized lung bases are clear. Hepatobiliary: Liver demonstrates a normal contrast enhanced appearance. Mild diffuse periportal edema. Gallbladder contracted without abnormality. No biliary dilatation. Pancreas: Few scattered calcifications noted within the pancreas, stable. 2.2 x 4.6 cm cyst positioned just superior to the pancreatic tail again seen, unchanged. No acute peripancreatic inflammation. Spleen: Spleen within normal limits. Adrenals/Urinary Tract: Adrenal glands are normal. Kidneys equal in size with symmetric enhancement. No nephrolithiasis, hydronephrosis or focal enhancing renal mass. No visible hydroureter. Bladder moderately distended without abnormality. Stomach/Bowel: Stomach within normal limits. No evidence for bowel obstruction. No definite acute inflammatory changes seen about the bowels. Appendix not well visualized. Vascular/Lymphatic: Normal intravascular enhancement seen throughout the intra-abdominal aorta. Mesenteric vessels patent proximally. No pathologically enlarged intra-abdominal or pelvic lymph nodes. Mildly enlarged bilateral inguinal lymph nodes measure up to 18 mm on the right and 14 mm on the left, indeterminate, but could be reactive. Reproductive: Uterus and ovaries within normal limits for age. Other: No free intraperitoneal air. Small volume free fluid present within the right lower quadrant and pelvis, nonspecific, but could be reactive and/or physiologic. Mild diffuse mesenteric edema noted. Musculoskeletal: Diffuse anasarca seen throughout the external soft tissues. There is focal soft tissue swelling and inflammatory stranding involving the subcutaneous fat posterior to the sacrum, concerning for infection/cellulitis. Overlying skin thickening. Inflammatory changes are fairly ill-defined at this time, most consistent with phlegmon. No definite discrete abscess or drainable fluid collection. There is a small focus of probable  gas within this region, which could reflect infection with gas-forming organism or  possibly communication with the overlying skin (series 2, image 59). No other dissecting soft tissue emphysema seen elsewhere. Additionally, evidence for osseous erosion at the underlying sacrum at approximately the S3/S4 segment, highly suggestive of associated osteomyelitis. IMPRESSION: 1. Soft tissue swelling with inflammatory stranding involving the subcutaneous fat posterior to the sacrum, concerning for infection/cellulitis. Small focus of gas within this region could reflect changes related to an aggressive soft tissue infection with gas-forming organism or possibly communication with the overlying skin (series 2, image 59). Correlation with physical exam for possible ulceration and/or sinus tract recommended. 2. Associated osseous erosion at the underlying sacrum at approximately the S3/S4 segment, highly suggestive of associated osteomyelitis. 3. Small volume free fluid within the right lower quadrant, nonspecific, but could be reactive and/or physiologic. 4. Mildly enlarged bilateral inguinal lymph nodes, nonspecific, but may be reactive. 5. Atrophic pancreas with scattered calcifications, consistent with chronic pancreatitis. 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 Electronically Signed   By: Jeannine Boga M.D.   On: 06/07/2019 01:59   MR SACRUM SI JOINTS W WO CONTRAST  Result Date: 06/07/2019 CLINICAL DATA:  Low back/buttock/sacral pain with fever and abnormal CT scan. Elevated white blood cell count. EXAM: MRI PELVIS WITHOUT AND WITH CONTRAST TECHNIQUE: Multiplanar multisequence MR imaging of the pelvis was performed both before and after administration of intravenous contrast. CONTRAST:  18mL GADAVIST GADOBUTROL 1 MMOL/ML IV SOLN COMPARISON:  CT scan, same date. FINDINGS: There appears to be an open wound involving the right lower sacral area with fluid  contacting the skin. The right gluteus maximus muscle is markedly thickened and demonstrates diffuse myositis. Small fluid collections are noted in the muscle 1 of which demonstrates rim enhancement after contrast consistent with pyomyositis. Do not see a well-formed discrete drainable soft tissue abscess but there is significant focal cellulitis involving the subcutaneous tissues in the right buttock area overlying the gluteus maximus. Abnormal T2 signal intensity and enhancement in the lower sacral segments (3, 4 and 5) is highly suspicious for osteomyelitis. The coccyx segments appear intact. Both hips are normally located. No findings for septic arthritis, osteomyelitis or AVN. The pubic symphysis and SI joints are intact. No evidence of septic arthritis. Mild inflammation/edema in the right adductor muscles suggesting myositis without evidence of pyomyositis. No significant intrapelvic abnormalities are identified. The bladder is grossly normal and the uterus and ovaries are unremarkable. There is a small amount of free pelvic fluid and moderate presacral edema. No rim enhancing intrapelvic abscess is identified. IMPRESSION: 1. Suspect small open wound at the very top of the gluteal fold on the right with underlying focal cellulitis, myofasciitis involving the right gluteus maximus muscle and a small focus of pyomyositis. No drainable abscess is identified. 2. Findings suspicious for sacral osteomyelitis. 3. No findings for septic arthritis and no intrapelvic abscess. Electronically Signed   By: Marijo Sanes M.D.   On: 06/07/2019 09:20    Anti-infectives: Anti-infectives (From admission, onward)   Start     Dose/Rate Route Frequency Ordered Stop   06/07/19 1500  vancomycin (VANCOREADY) IVPB 750 mg/150 mL     750 mg 150 mL/hr over 60 Minutes Intravenous Every 12 hours 06/07/19 0151     06/07/19 1030  clindamycin (CLEOCIN) IVPB 600 mg     600 mg 100 mL/hr over 30 Minutes Intravenous Every 8 hours  06/07/19 0741     06/07/19 0730  clindamycin (CLEOCIN) IVPB 600 mg  Status:  Discontinued  600 mg 100 mL/hr over 30 Minutes Intravenous Every 8 hours 06/07/19 0724 06/07/19 0741   06/07/19 0600  ceFEPIme (MAXIPIME) 2 g in sodium chloride 0.9 % 100 mL IVPB     2 g 200 mL/hr over 30 Minutes Intravenous Every 8 hours 06/07/19 0343     06/07/19 0215  clindamycin (CLEOCIN) IVPB 600 mg     600 mg 100 mL/hr over 30 Minutes Intravenous  Once 06/07/19 0204 06/07/19 0254   06/07/19 0200  vancomycin (VANCOREADY) IVPB 500 mg/100 mL     500 mg 100 mL/hr over 60 Minutes Intravenous  Once 06/07/19 0025 06/07/19 0313   06/07/19 0000  cefTRIAXone (ROCEPHIN) 2 g in sodium chloride 0.9 % 100 mL IVPB     2 g 200 mL/hr over 30 Minutes Intravenous  Once 06/06/19 2333 06/07/19 0042   06/06/19 2345  vancomycin (VANCOCIN) IVPB 1000 mg/200 mL premix     1,000 mg 200 mL/hr over 60 Minutes Intravenous  Once 06/06/19 2333 06/07/19 0108      Assessment/Plan: Lindsey Murillo is a 24 year old female with Type I diabetes who presents with pain and indurated macular lesion over right sacrum concerning for cellulitis. MRI demonstrates focal area of cellulitis and myositis of right gluteus maximus muscle with possible osteomyelitis. This morning there was no drainage and there were no areas of fluctuance concerning for abscess formation. Her gram stain of blood cultures shows gram positive cocci which may represent contamination with skin flora.  - Continue antibiotic treatment for cellulitis - Follow up with cultures   LOS: 1 day    Marcelino Duster 06/08/2019

## 2019-06-08 NOTE — Progress Notes (Signed)
Patient transported via bed upstairs.

## 2019-06-08 NOTE — Progress Notes (Signed)
Message left for mother letting know patient transferred to [phone].

## 2019-06-08 NOTE — Progress Notes (Signed)
PROGRESS NOTE    [name]  [id number] DOB: [birth year] DOA: 06/06/2019 PCP: Mikey Kirschner, MD   Brief Narrative:  Per HPI: Lindsey Brett Murchisonis a 24 y.o.femalewith medical history significant oftype 1 diabetes who presented to the ER with increased back pain.Patient states she had increased pain over the lower back and sacral region over the last 2 days. She is had difficulty in the sitting position due to increased pain. Today she started having a fever with chills. Currently not on antibiotics. Has a history of diabetes mellitus type 1 which is uncontrolled. No prior history of spinal abscesses, osteomyelitis. ED Course: Vital Signs reviewed on presentation, significant fortemperature 101.1, heart rate 125, blood pressure 143/104, saturation 99% room air. Labs reviewed, significant forsodium 130, potassium 4.1, chloride 95, glucose 234, BUN 19, creatinine 0.9, albumin 2.0, LFTs within normal meds, lactic acid 0.9, WBC count 20.9, hemoglobin 8.5, hematocrit 26, platelets 342, pregnancy test is negative. Flu PCR negative, SARS Covid RT-PCR negative, urinalysis negative, blood and urine cultures have been sent. Imaging personally Reviewed,CT of the abdomen pelvis with contrast shows diffuse anasarca seen throughout the external soft tissue with focal soft tissue swelling and inflammatory stranding involving the subcutaneous fat posterior to the sacrum concerning for infection/cellulitis. Overlying skin thickening. Inflammatory changes are fairly ill-defined at this time, most consistent with phlegmon. No definite discrete abscess or drainable fluid collection. Small focus of probable gas within the region which could reflect infection with gas-forming organism or possible communication with overlying skin. Evidence for osseous erosion of the underlying sacrum at approximately S3-S4 segment highly suggestive of osteomyelitis. EKG personally reviewed, showssinus  tachycardia, no acute ST-T changes.  1/28:Patient seen and evaluated at bedside this morning.  She has just had MRI demonstrating some osteomyelitis.  General surgery consultation pending.  Continue on current IV antibiotics monitor blood cultures with no growth noted thus far.  Obtain wound culture.  She is noted to have some blurry vision and will try eyedrops for now.  No other neurological deficits identified.  We will plan to check cultures and see if any surgical cultures could be obtained as well.  Plan to discuss with ID plans for IV antibiotic treatment in the next 48 hours.  Plan to keep at this facility with no need for transfer noted.  1/29: Patient seen by general surgery with plan to continue current IV antibiotics.  She appears to be doing somewhat better overnight and is okay for transfer to telemetry today.  Blood cultures with gram-positive cocci in 1 set which is likely contaminant.  Fluid collection could not be seen or drained in the sacral region at this time.  Monitor blood cultures for ID and sensitivity to ensure that this is contaminant and monitor urine repeat CBC.  We will plan to discuss with ID in the next 1-2 days.  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  Type 1 diabetes-improved  control -Started mealtime insulin today -Continue SSI  Chronic normocytic anemia-stable -Likely anemia of chronic disease -Anemia panel without significant findings   DVT prophylaxis: Lovenox Code Status: Full Family Communication: We will attempt to call family Disposition Plan: Continue current IV antibiotics and monitor cultures for ID and sensitivity.  Will discuss with ID once more data available.  Plan to transfer to telemetry.   Consultants:   General surgery  Procedures:   None  Antimicrobials:  Anti-infectives (From admission, onward)   Start     Dose/Rate Route Frequency Ordered Stop   06/07/19 1500  vancomycin (VANCOREADY) IVPB 750 mg/150 mL     750 mg 150 mL/hr over 60 Minutes Intravenous Every 12 hours 06/07/19 0151     06/07/19 1030  clindamycin (CLEOCIN) IVPB 600 mg     600 mg 100 mL/hr over 30 Minutes Intravenous Every 8 hours 06/07/19 0741     06/07/19 0730  clindamycin (CLEOCIN) IVPB 600 mg  Status:  Discontinued     600 mg 100 mL/hr over 30 Minutes Intravenous Every 8 hours 06/07/19 0724 06/07/19 0741   06/07/19 0600  ceFEPIme (MAXIPIME) 2 g in sodium chloride 0.9 % 100 mL IVPB     2 g 200 mL/hr over 30 Minutes Intravenous Every 8 hours 06/07/19 0343     06/07/19 0215  clindamycin (CLEOCIN) IVPB 600 mg     600 mg 100 mL/hr over 30 Minutes Intravenous  Once 06/07/19 0204 06/07/19 0254   06/07/19 0200  vancomycin (VANCOREADY) IVPB 500 mg/100 mL     500 mg 100 mL/hr over 60 Minutes Intravenous  Once 06/07/19 0025 06/07/19 0313   06/07/19 0000  cefTRIAXone (ROCEPHIN) 2 g in sodium chloride 0.9 % 100 mL IVPB     2 g 200 mL/hr over 30 Minutes Intravenous  Once 06/06/19 2333 06/07/19 0042   06/06/19 2345  vancomycin (VANCOCIN) IVPB 1000 mg/200 mL premix     1,000 mg 200 mL/hr over 60 Minutes Intravenous  Once 06/06/19 2333 06/07/19 0108       Subjective: Patient seen and evaluated today with no new acute complaints or concerns. No acute concerns or  events noted overnight.  Objective: Vitals:   06/08/19 0200 06/08/19 0400 06/08/19 0500 06/08/19 0600  BP: 119/82   135/88  Pulse: (!) 107   62  Resp: 15   16  Temp:  98.5 F (36.9 C)    TempSrc:  Oral    SpO2: 100%   99%  Weight:   54.1 kg   Height:        Intake/Output Summary (Last 24 hours) at 06/08/2019 1201 Last data filed at 06/08/2019 0356 Gross per 24 hour  Intake 2819.45 ml  Output --  Net 2819.45 ml   Filed Weights   06/06/19 2227 06/07/19 1127 06/08/19 0500  Weight: 60.8 kg 54.5 kg 54.1 kg    Examination:  General exam: Appears calm and comfortable  Respiratory system: Clear to auscultation. Respiratory effort normal. Cardiovascular system: S1 & S2 heard, RRR. No JVD, murmurs, rubs, gallops or clicks. No pedal edema. Gastrointestinal system: Abdomen is nondistended, soft and nontender. No organomegaly or masses felt. Normal bowel sounds heard. Central nervous system: Alert and oriented. No focal neurological deficits. Extremities: Symmetric 5 x 5 power. Skin: No rashes, lesions or ulcers, no significant findings over sacral region Psychiatry: Judgement and insight appear normal. Mood & affect appropriate.     Data Reviewed: I  have personally reviewed following labs and imaging studies  CBC: Recent Labs  Lab 06/06/19 2343 06/07/19 0606 06/08/19 0831  WBC 20.9* 20.2* 22.0*  HGB 8.5* 9.4* 7.8*  HCT 26.6* 28.6* 23.8*  MCV 81.1 81.5 81.5  PLT 342 288 [id number]   Basic Metabolic Panel: Recent Labs  Lab 06/06/19 2343 06/07/19 0606 06/08/19 0831  NA 130* 130* 132*  K 4.1 3.9 3.9  CL 95* 95* 97*  CO2 26 25 24   GLUCOSE 234* 231* 134*  BUN 19 17 19   CREATININE 0.99 0.80 0.76  CALCIUM 8.3* 7.9* 7.8*   GFR: Estimated Creatinine Clearance: 92.6 mL/min (by C-G formula based on SCr of 0.76 mg/dL). Liver Function Tests: Recent Labs  Lab 06/06/19 2343  AST 55*  ALT 36  ALKPHOS 433*  BILITOT 0.4  PROT 8.1  ALBUMIN 2.0*   No results for input(s):  LIPASE, AMYLASE in the last 168 hours. No results for input(s): AMMONIA in the last 168 hours. Coagulation Profile: No results for input(s): INR, PROTIME in the last 168 hours. Cardiac Enzymes: No results for input(s): CKTOTAL, CKMB, CKMBINDEX, TROPONINI in the last 168 hours. BNP (last 3 results) No results for input(s): PROBNP in the last 8760 hours. HbA1C: No results for input(s): HGBA1C in the last 72 hours. CBG: Recent Labs  Lab 06/07/19 0328 06/07/19 1129 06/07/19 1617 06/07/19 2137 06/08/19 0816  GLUCAP 204* 199* 140* 100* 116*   Lipid Profile: No results for input(s): CHOL, HDL, LDLCALC, TRIG, CHOLHDL, LDLDIRECT in the last 72 hours. Thyroid Function Tests: No results for input(s): TSH, T4TOTAL, FREET4, T3FREE, THYROIDAB in the last 72 hours. Anemia Panel: Recent Labs    06/07/19 0153 06/07/19 0721  VITAMINB12 1,265*  --   FOLATE  --  7.9  FERRITIN 131  --   TIBC 255  --   IRON 11*  --   RETICCTPCT 1.2  --    Sepsis Labs: Recent Labs  Lab 06/06/19 2345 06/07/19 0153  PROCALCITON  --  0.31  LATICACIDVEN 0.9 0.7    Recent Results (from the past 240 hour(s))  Respiratory Panel by RT PCR (Flu A&B, Covid) - Nasopharyngeal Swab     Status: None   Collection Time: 06/06/19 11:41 PM   Specimen: Nasopharyngeal Swab  Result Value Ref Range Status   SARS Coronavirus 2 by RT PCR NEGATIVE NEGATIVE Final    Comment: (NOTE) SARS-CoV-2 target nucleic acids are NOT DETECTED. The SARS-CoV-2 RNA is generally detectable in upper respiratoy specimens during the acute phase of infection. The lowest concentration of SARS-CoV-2 viral copies this assay can detect is 131 copies/mL. A negative result does not preclude SARS-Cov-2 infection and should not be used as the sole basis for treatment or other patient management decisions. A negative result may occur with  improper specimen collection/handling, submission of specimen other than nasopharyngeal swab, presence of viral  mutation(s) within the areas targeted by this assay, and inadequate number of viral copies (<131 copies/mL). A negative result must be combined with clinical observations, patient history, and epidemiological information. The expected result is Negative. Fact Sheet for Patients:  PinkCheek.be Fact Sheet for Healthcare Providers:  GravelBags.it This test is not yet ap proved or cleared by the [place] FDA and  has been authorized for detection and/or diagnosis of SARS-CoV-2 by FDA under an Emergency Use Authorization (EUA). This EUA will remain  in effect (meaning this test can be used) for the duration of the COVID-19 declaration under Section 564(b)(1) of the Act, 21  U.S.C. section 360bbb-3(b)(1), unless the authorization is terminated or revoked sooner.    Influenza A by PCR NEGATIVE NEGATIVE Final   Influenza B by PCR NEGATIVE NEGATIVE Final    Comment: (NOTE) The Xpert Xpress SARS-CoV-2/FLU/RSV assay is intended as an aid in  the diagnosis of influenza from Nasopharyngeal swab specimens and  should not be used as a sole basis for treatment. Nasal washings and  aspirates are unacceptable for Xpert Xpress SARS-CoV-2/FLU/RSV  testing. Fact Sheet for Patients: PinkCheek.be Fact Sheet for Healthcare Providers: GravelBags.it This test is not yet approved or cleared by the [place] FDA and  has been authorized for detection and/or diagnosis of SARS-CoV-2 by  FDA under an Emergency Use Authorization (EUA). This EUA will remain  in effect (meaning this test can be used) for the duration of the  Covid-19 declaration under Section 564(b)(1) of the Act, 21  U.S.C. section 360bbb-3(b)(1), unless the authorization is  terminated or revoked. Performed at [facility], [address], [place], [place] [address]   Blood Culture (routine x 2)     Status: None (Preliminary  result)   Collection Time: 06/06/19 11:45 PM   Specimen: BLOOD RIGHT FOREARM  Result Value Ref Range Status   Specimen Description BLOOD RIGHT FOREARM  Final   Special Requests   Final    BOTTLES DRAWN AEROBIC AND ANAEROBIC Blood Culture adequate volume   Culture   Final    NO GROWTH 2 DAYS Performed at [facility], [address] [place], [place] [address]    Report Status PENDING  Incomplete  Blood Culture (routine x 2)     Status: None (Preliminary result)   Collection Time: 06/06/19 11:46 PM   Specimen: BLOOD  Result Value Ref Range Status   Specimen Description BLOOD RIGHT ANTECUBITAL  Final   Special Requests   Final    BOTTLES DRAWN AEROBIC AND ANAEROBIC Blood Culture adequate volume   Culture  Setup Time   Final    GRAM POSITIVE COCCI Gram Stain Report Called to,Read Back By and Verified With: LASHLEY,S AT 0730 ON 06/08/19 BY HUFFINES,S.    Culture   Final    NO GROWTH 2 DAYS Performed at [facility], [address], [place], [place] [address]    Report Status PENDING  Incomplete  Urine culture     Status: Abnormal   Collection Time: 06/07/19  2:12 AM   Specimen: Urine, Clean Catch  Result Value Ref Range Status   Specimen Description   Final    URINE, CLEAN CATCH Performed at [facility], [address] [place], [place] [address]    Special Requests   Final    NONE Performed at [facility], [address], [place], [place] [address]    Culture MULTIPLE SPECIES PRESENT, SUGGEST RECOLLECTION (A)  Final   Report Status 06/08/2019 FINAL  Final  MRSA PCR Screening     Status: None   Collection Time: 06/07/19 10:00 AM   Specimen: Nasal Mucosa; Nasopharyngeal  Result Value Ref Range Status   MRSA by PCR NEGATIVE NEGATIVE Final    Comment:        The GeneXpert MRSA Assay (FDA approved for NASAL specimens only), is one component of a comprehensive MRSA colonization surveillance program. It is not intended to diagnose MRSA infection nor to guide or monitor  treatment for MRSA infections. Performed at [facility], [address], [place], [place] [address]          Radiology Studies: CT ABDOMEN  PELVIS W CONTRAST  Result Date: 06/07/2019 CLINICAL DATA:  Initial evaluation for acute abdominal abscess/infection. EXAM: CT ABDOMEN AND PELVIS WITH CONTRAST TECHNIQUE: Multidetector CT imaging of the abdomen and pelvis was performed using the standard protocol following bolus administration of intravenous contrast. CONTRAST:  163mL OMNIPAQUE IOHEXOL 300 MG/ML  SOLN COMPARISON:  None. FINDINGS: Lower chest: Visualized lung bases are clear. Hepatobiliary: Liver demonstrates a normal contrast enhanced appearance. Mild diffuse periportal edema. Gallbladder contracted without abnormality. No biliary dilatation. Pancreas: Few scattered calcifications noted within the pancreas, stable. 2.2 x 4.6 cm cyst positioned just superior to the pancreatic tail again seen, unchanged. No acute peripancreatic inflammation. Spleen: Spleen within normal limits. Adrenals/Urinary Tract: Adrenal glands are normal. Kidneys equal in size with symmetric enhancement. No nephrolithiasis, hydronephrosis or focal enhancing renal mass. No visible hydroureter. Bladder moderately distended without abnormality. Stomach/Bowel: Stomach within normal limits. No evidence for bowel obstruction. No definite acute inflammatory changes seen about the bowels. Appendix not well visualized. Vascular/Lymphatic: Normal intravascular enhancement seen throughout the intra-abdominal aorta. Mesenteric vessels patent proximally. No pathologically enlarged intra-abdominal or pelvic lymph nodes. Mildly enlarged bilateral inguinal lymph nodes measure up to 18 mm on the right and 14 mm on the left, indeterminate, but could be reactive. Reproductive: Uterus and ovaries within normal limits for age. Other: No free intraperitoneal air. Small volume free fluid present within the right lower quadrant and pelvis, nonspecific,  but could be reactive and/or physiologic. Mild diffuse mesenteric edema noted. Musculoskeletal: Diffuse anasarca seen throughout the external soft tissues. There is focal soft tissue swelling and inflammatory stranding involving the subcutaneous fat posterior to the sacrum, concerning for infection/cellulitis. Overlying skin thickening. Inflammatory changes are fairly ill-defined at this time, most consistent with phlegmon. No definite discrete abscess or drainable fluid collection. There is a small focus of probable gas within this region, which could reflect infection with gas-forming organism or possibly communication with the overlying skin (series 2, image 59). No other dissecting soft tissue emphysema seen elsewhere. Additionally, evidence for osseous erosion at the underlying sacrum at approximately the S3/S4 segment, highly suggestive of associated osteomyelitis. IMPRESSION: 1. Soft tissue swelling with inflammatory stranding involving the subcutaneous fat posterior to the sacrum, concerning for infection/cellulitis. Small focus of gas within this region could reflect changes related to an aggressive soft tissue infection with gas-forming organism or possibly communication with the overlying skin (series 2, image 59). Correlation with physical exam for possible ulceration and/or sinus tract recommended. 2. Associated osseous erosion at the underlying sacrum at approximately the S3/S4 segment, highly suggestive of associated osteomyelitis. 3. Small volume free fluid within the right lower quadrant, nonspecific, but could be reactive and/or physiologic. 4. Mildly enlarged bilateral inguinal lymph nodes, nonspecific, but may be reactive. 5. Atrophic pancreas with scattered calcifications, consistent with chronic pancreatitis. 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 Electronically Signed   By: Jeannine Boga M.D.   On: 06/07/2019 01:59   MR SACRUM SI  JOINTS W WO CONTRAST  Result Date: 06/07/2019 CLINICAL DATA:  Low back/buttock/sacral pain with fever and abnormal CT scan. Elevated white blood cell count. EXAM: MRI PELVIS WITHOUT AND WITH CONTRAST TECHNIQUE: Multiplanar multisequence MR imaging of the pelvis was performed both before and after administration of intravenous contrast. CONTRAST:  32mL GADAVIST GADOBUTROL 1 MMOL/ML IV SOLN COMPARISON:  CT scan, same date. FINDINGS: There appears to be an open wound involving the right lower sacral area with fluid contacting the skin. The right gluteus  maximus muscle is markedly thickened and demonstrates diffuse myositis. Small fluid collections are noted in the muscle 1 of which demonstrates rim enhancement after contrast consistent with pyomyositis. Do not see a well-formed discrete drainable soft tissue abscess but there is significant focal cellulitis involving the subcutaneous tissues in the right buttock area overlying the gluteus maximus. Abnormal T2 signal intensity and enhancement in the lower sacral segments (3, 4 and 5) is highly suspicious for osteomyelitis. The coccyx segments appear intact. Both hips are normally located. No findings for septic arthritis, osteomyelitis or AVN. The pubic symphysis and SI joints are intact. No evidence of septic arthritis. Mild inflammation/edema in the right adductor muscles suggesting myositis without evidence of pyomyositis. No significant intrapelvic abnormalities are identified. The bladder is grossly normal and the uterus and ovaries are unremarkable. There is a small amount of free pelvic fluid and moderate presacral edema. No rim enhancing intrapelvic abscess is identified. IMPRESSION: 1. Suspect small open wound at the very top of the gluteal fold on the right with underlying focal cellulitis, myofasciitis involving the right gluteus maximus muscle and a small focus of pyomyositis. No drainable abscess is identified. 2. Findings suspicious for sacral  osteomyelitis. 3. No findings for septic arthritis and no intrapelvic abscess. Electronically Signed   By: Marijo Sanes M.D.   On: 06/07/2019 09:20        Scheduled Meds:  Chlorhexidine Gluconate Cloth  6 each Topical Q0600   enoxaparin (LOVENOX) injection  40 mg Subcutaneous Q24H   insulin aspart  0-15 Units Subcutaneous TID WC   insulin aspart  0-5 Units Subcutaneous QHS   insulin aspart  6 Units Subcutaneous TID WC   insulin glargine  30 Units Subcutaneous QHS   sodium chloride flush  3 mL Intravenous Q12H   Continuous Infusions:  sodium chloride     sodium chloride     ceFEPime (MAXIPIME) IV 2 g (06/08/19 0523)   clindamycin (CLEOCIN) IV 600 mg (06/08/19 0625)   vancomycin 750 mg (06/08/19 0313)     LOS: 1 day    Time spent: 30 minutes    Exavior Kimmons Darleen Crocker, DO [facility] Pager [phone]  If 7PM-7AM, please contact night-coverage [email or web address] Password [facility] 06/08/2019, 12:01 PM

## 2019-06-09 ENCOUNTER — Inpatient Hospital Stay: Payer: Self-pay

## 2019-06-09 LAB — BASIC METABOLIC PANEL
Anion gap: 9 (ref 5–15)
BUN: 20 mg/dL (ref 6–20)
CO2: 26 mmol/L (ref 22–32)
Calcium: 7.9 mg/dL — ABNORMAL LOW (ref 8.9–10.3)
Chloride: 98 mmol/L (ref 98–111)
Creatinine, Ser: 0.85 mg/dL (ref 0.44–1.00)
GFR calc Af Amer: >60 mL/min (ref >60)
GFR calc non Af Amer: >60 mL/min (ref >60)
Glucose, Bld: 110 mg/dL — ABNORMAL HIGH (ref 70–99)
Potassium: 3.5 mmol/L (ref 3.5–5.1)
Sodium: 133 mmol/L — ABNORMAL LOW (ref 135–145)

## 2019-06-09 LAB — CBC
HCT: 24.1 % — ABNORMAL LOW (ref 36.0–46.0)
Hemoglobin: 7.7 g/dL — ABNORMAL LOW (ref 12.0–15.0)
MCH: 26.5 pg (ref 26.0–34.0)
MCHC: 32 g/dL (ref 30.0–36.0)
MCV: 82.8 fL (ref 80.0–100.0)
Platelets: 341 10*3/uL (ref 150–400)
RBC: 2.91 MIL/uL — ABNORMAL LOW (ref 3.87–5.11)
RDW: 14.1 % (ref 11.5–15.5)
WBC: 18.5 10*3/uL — ABNORMAL HIGH (ref 4.0–10.5)
nRBC: 0 % (ref 0.0–0.2)

## 2019-06-09 LAB — SEDIMENTATION RATE: Sed Rate: 130 mm/hr — ABNORMAL HIGH (ref 0–22)

## 2019-06-09 LAB — GLUCOSE, CAPILLARY
Glucose-Capillary: 114 mg/dL — ABNORMAL HIGH (ref 70–99)
Glucose-Capillary: 86 mg/dL (ref 70–99)
Glucose-Capillary: 104 mg/dL — ABNORMAL HIGH (ref 70–99)
Glucose-Capillary: 42 mg/dL — CL (ref 70–99)
Glucose-Capillary: 87 mg/dL (ref 70–99)

## 2019-06-09 LAB — C-REACTIVE PROTEIN: CRP: 10.5 mg/dL — ABNORMAL HIGH (ref <1.0)

## 2019-06-09 MED ORDER — VANCOMYCIN IV (FOR PTA / DISCHARGE USE ONLY): 750.0000 mg | Freq: Two times a day (BID) | INTRAVENOUS | 0 refills | Status: DC

## 2019-06-09 MED ORDER — CEFTRIAXONE IV (FOR PTA / DISCHARGE USE ONLY): 2.0000 g | INTRAVENOUS | 0 refills | Status: DC

## 2019-06-09 MED ORDER — SODIUM CHLORIDE 0.9 % IV SOLN
2.0000 g | INTRAVENOUS | Status: DC
  Administered 2019-06-09 – 2019-06-12 (×4): 2 g via INTRAVENOUS
  Filled 2019-06-09 (×3): qty 20

## 2019-06-09 NOTE — Progress Notes (Signed)
Rechecked pt CBG after 1 hour of providing food. CBG is now 87. Gave patient another cup of orange juice and asked if she would drink it before going to sleep so that we may prevent her sugar from bottoming over night. Patient verbalized understanding. Informed patient's nurse about situation.

## 2019-06-09 NOTE — Progress Notes (Signed)
CVW called for PICC placement.

## 2019-06-09 NOTE — Progress Notes (Signed)
PROGRESS NOTE    Lindsey Murillo  GQB:[id number] DOB: [birth year] DOA: 06/06/2019 PCP: Mikey Kirschner, MD   Brief Narrative:  Per HPI: Lindsey Schorsch Murchisonis a 24 y.o.femalewith medical history significant oftype 1 diabetes who presented to the ER with increased back pain.Patient states she had increased pain over the lower back and sacral region over the last 2 days. She is had difficulty in the sitting position due to increased pain. Today she started having a fever with chills. Currently not on antibiotics. Has a history of diabetes mellitus type 1 which is uncontrolled. No prior history of spinal abscesses, osteomyelitis. ED Course: Vital Signs reviewed on presentation, significant fortemperature 101.1, heart rate 125, blood pressure 143/104, saturation 99% room air. Labs reviewed, significant forsodium 130, potassium 4.1, chloride 95, glucose 234, BUN 19, creatinine 0.9, albumin 2.0, LFTs within normal meds, lactic acid 0.9, WBC count 20.9, hemoglobin 8.5, hematocrit 26, platelets 342, pregnancy test is negative. Flu PCR negative, SARS Covid RT-PCR negative, urinalysis negative, blood and urine cultures have been sent. Imaging personally Reviewed,CT of the abdomen pelvis with contrast shows diffuse anasarca seen throughout the external soft tissue with focal soft tissue swelling and inflammatory stranding involving the subcutaneous fat posterior to the sacrum concerning for infection/cellulitis. Overlying skin thickening. Inflammatory changes are fairly ill-defined at this time, most consistent with phlegmon. No definite discrete abscess or drainable fluid collection. Small focus of probable gas within the region which could reflect infection with gas-forming organism or possible communication with overlying skin. Evidence for osseous erosion of the underlying sacrum at approximately S3-S4 segment highly suggestive of osteomyelitis. EKG personally reviewed, showssinus  tachycardia, no acute ST-T changes.  1/28:Patient seen and evaluated at bedside this morning. She has just had MRI demonstrating some osteomyelitis. General surgery consultation pending. Continue on current IV antibiotics monitor blood cultures with no growth noted thus far. Obtain wound culture. She is noted to have some blurry vision and will try eyedrops for now. No other neurological deficits identified. We will plan to check cultures and see if any surgical cultures could be obtained as well. Plan to discuss with ID plans for IV antibiotic treatment in the next 48 hours. Plan to keep at this facility with no need for transfer noted.  1/29: Patient seen by general surgery with plan to continue current IV antibiotics.  She appears to be doing somewhat better overnight and is okay for transfer to telemetry today.  Blood cultures with gram-positive cocci in 1 set which is likely contaminant.  Fluid collection could not be seen or drained in the sacral region at this time.  Monitor blood cultures for ID and sensitivity to ensure that this is contaminant and monitor urine repeat CBC.  We will plan to discuss with ID in the next 1-2 days.  1/30: Patient overall doing well this morning with no fever overnight.  She states that the pain in her low back has improved and leukocytosis is improving as well.  We will recheck ESR and CRP.  Blood culture appears to be contaminant.  Will order placement of PICC line and plan for outpatient IV antibiotic treatment for 6 weeks with Rocephin and vancomycin after discussion with ID.  She will also need weekly ESR and CRP.  Plan for PT evaluation today as well.  Monitor for another 24 hours and try to assess for discharge in a.m.   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 to ensure further resolution of leukocytosis as well as CRP and ESR -Appreciate general surgery evaluation with no plans for debridement at this time -Discussed with ID who recommends vancomycin along with Rocephin 2 g daily for the next 6 weeks.  PICC line order placed.  Type 1 diabetes-improved control -Continue mealtime insulin -Continue SSI  Chronic normocytic anemia-stable -Likely anemia of chronic disease -Anemia panel without significant findings   DVT prophylaxis: Lovenox Code Status: Full Family Communication:  Discussed with mother 1/29 Disposition Plan:  Continue now on vancomycin and Rocephin only.  Plan for PICC line today.  Outpatient antibiotics with vancomycin and Rocephin to be arranged by pharmacy.  PT evaluation today.  Recheck labs in a.m. and anticipate discharge soon thereafter.   Consultants:   General surgery  ID  Procedures:   None  Antimicrobials:  Anti-infectives (From admission, onward)   Start     Dose/Rate Route Frequency Ordered Stop   06/09/19 0845  cefTRIAXone (ROCEPHIN) 2 g in sodium chloride 0.9 % 100 mL IVPB     2 g 200 mL/hr over 30 Minutes Intravenous Every 24 hours 06/09/19 0835     06/07/19 1500  vancomycin (VANCOREADY) IVPB 750 mg/150 mL     750 mg 150 mL/hr over 60 Minutes Intravenous Every 12 hours 06/07/19 0151     06/07/19 1030  clindamycin (CLEOCIN) IVPB 600 mg  Status:  Discontinued     600 mg 100 mL/hr over 30 Minutes Intravenous Every 8 hours 06/07/19 0741 06/09/19 0835   06/07/19 0730  clindamycin (CLEOCIN) IVPB 600 mg  Status:  Discontinued      600 mg 100 mL/hr over 30 Minutes Intravenous Every 8 hours 06/07/19 0724 06/07/19 0741   06/07/19 0600  ceFEPIme (MAXIPIME) 2 g in sodium chloride 0.9 % 100 mL IVPB  Status:  Discontinued     2 g 200 mL/hr over 30 Minutes Intravenous Every 8 hours 06/07/19 0343 06/09/19 0835   06/07/19 0215  clindamycin (CLEOCIN) IVPB 600 mg     600 mg 100 mL/hr over 30 Minutes Intravenous  Once 06/07/19 0204 06/07/19 0254   06/07/19 0200  vancomycin (VANCOREADY) IVPB 500 mg/100 mL     500 mg 100 mL/hr over 60 Minutes Intravenous  Once 06/07/19 0025 06/07/19 0313   06/07/19 0000  cefTRIAXone (ROCEPHIN) 2 g in sodium chloride 0.9 % 100 mL IVPB     2 g 200 mL/hr over 30 Minutes Intravenous  Once 06/06/19 2333 06/07/19 0042   06/06/19 2345  vancomycin (VANCOCIN) IVPB 1000 mg/200 mL premix     1,000 mg 200 mL/hr over 60 Minutes Intravenous  Once 06/06/19 2333 06/07/19 0108       Subjective: Patient seen and evaluated today with no new acute complaints or concerns. No acute concerns or events noted overnight.  She states that her back pain has improved.  Blood glucose control has improved as well.  No fever overnight.  Objective: Vitals:   06/08/19 2019 06/08/19 2035 06/09/19 0509 06/09/19 0511  BP:  100/72 115/88   Pulse: (!) 116 Marland Kitchen)  109 (!) 102   Resp: 17 16 18    Temp:    97.7 F (36.5 C)  TempSrc:    Oral  SpO2: 100% 100% 100%   Weight:      Height:        Intake/Output Summary (Last 24 hours) at 06/09/2019 0841 Last data filed at 06/09/2019 0300 Gross per 24 hour  Intake 897.6 ml  Output --  Net 897.6 ml   Filed Weights   06/06/19 2227 06/07/19 1127 06/08/19 0500  Weight: 60.8 kg 54.5 kg 54.1 kg    Examination:  General exam: Appears calm and comfortable  Respiratory system: Clear to auscultation. Respiratory effort normal. Cardiovascular system: S1 & S2 heard, RRR. No JVD, murmurs, rubs, gallops or clicks. No pedal edema. Gastrointestinal system: Abdomen is nondistended, soft  and nontender. No organomegaly or masses felt. Normal bowel sounds heard. Central nervous system: Alert and oriented. No focal neurological deficits. Extremities: Symmetric 5 x 5 power. Skin: No rashes, lesions or ulcers Psychiatry: Judgement and insight appear normal. Mood & affect appropriate.     Data Reviewed: I have personally reviewed following labs and imaging studies  CBC: Recent Labs  Lab 06/06/19 2343 06/07/19 0606 06/08/19 0831 06/09/19 0520  WBC 20.9* 20.2* 22.0* 18.5*  HGB 8.5* 9.4* 7.8* 7.7*  HCT 26.6* 28.6* 23.8* 24.1*  MCV 81.1 81.5 81.5 82.8  PLT 342 288 308 660   Basic Metabolic Panel: Recent Labs  Lab 06/06/19 2343 06/07/19 0606 06/08/19 0831 06/09/19 0520  NA 130* 130* 132* 133*  K 4.1 3.9 3.9 3.5  CL 95* 95* 97* 98  CO2 26 25 24 26   GLUCOSE 234* 231* 134* 110*  BUN 19 17 19 20   CREATININE 0.99 0.80 0.76 0.85  CALCIUM 8.3* 7.9* 7.8* 7.9*   GFR: Estimated Creatinine Clearance: 87.2 mL/min (by C-G formula based on SCr of 0.85 mg/dL). Liver Function Tests: Recent Labs  Lab 06/06/19 2343  AST 55*  ALT 36  ALKPHOS 433*  BILITOT 0.4  PROT 8.1  ALBUMIN 2.0*   No results for input(s): LIPASE, AMYLASE in the last 168 hours. No results for input(s): AMMONIA in the last 168 hours. Coagulation Profile: No results for input(s): INR, PROTIME in the last 168 hours. Cardiac Enzymes: No results for input(s): CKTOTAL, CKMB, CKMBINDEX, TROPONINI in the last 168 hours. BNP (last 3 results) No results for input(s): PROBNP in the last 8760 hours. HbA1C: No results for input(s): HGBA1C in the last 72 hours. CBG: Recent Labs  Lab 06/08/19 0816 06/08/19 1245 06/08/19 1556 06/08/19 2145 06/09/19 0751  GLUCAP 116* 84 76 75 114*   Lipid Profile: No results for input(s): CHOL, HDL, LDLCALC, TRIG, CHOLHDL, LDLDIRECT in the last 72 hours. Thyroid Function Tests: No results for input(s): TSH, T4TOTAL, FREET4, T3FREE, THYROIDAB in the last 72  hours. Anemia Panel: Recent Labs    06/07/19 0153 06/07/19 0721  VITAMINB12 1,265*  --   FOLATE  --  7.9  FERRITIN 131  --   TIBC 255  --   IRON 11*  --   RETICCTPCT 1.2  --    Sepsis Labs: Recent Labs  Lab 06/06/19 2345 06/07/19 0153  PROCALCITON  --  0.31  LATICACIDVEN 0.9 0.7    Recent Results (from the past 240 hour(s))  Respiratory Panel by RT PCR (Flu A&B, Covid) - Nasopharyngeal Swab     Status: None   Collection Time: 06/06/19 11:41 PM   Specimen: Nasopharyngeal Swab  Result Value Ref Range Status  SARS Coronavirus 2 by RT PCR NEGATIVE NEGATIVE Final    Comment: (NOTE) SARS-CoV-2 target nucleic acids are NOT DETECTED. The SARS-CoV-2 RNA is generally detectable in upper respiratoy specimens during the acute phase of infection. The lowest concentration of SARS-CoV-2 viral copies this assay can detect is 131 copies/mL. A negative result does not preclude SARS-Cov-2 infection and should not be used as the sole basis for treatment or other patient management decisions. A negative result may occur with  improper specimen collection/handling, submission of specimen other than nasopharyngeal swab, presence of viral mutation(s) within the areas targeted by this assay, and inadequate number of viral copies (<131 copies/mL). A negative result must be combined with clinical observations, patient history, and epidemiological information. The expected result is Negative. Fact Sheet for Patients:  PinkCheek.be Fact Sheet for Healthcare Providers:  GravelBags.it This test is not yet ap proved or cleared by the [place] FDA and  has been authorized for detection and/or diagnosis of SARS-CoV-2 by FDA under an Emergency Use Authorization (EUA). This EUA will remain  in effect (meaning this test can be used) for the duration of the COVID-19 declaration under Section 564(b)(1) of the Act, 21 U.S.C. section  360bbb-3(b)(1), unless the authorization is terminated or revoked sooner.    Influenza A by PCR NEGATIVE NEGATIVE Final   Influenza B by PCR NEGATIVE NEGATIVE Final    Comment: (NOTE) The Xpert Xpress SARS-CoV-2/FLU/RSV assay is intended as an aid in  the diagnosis of influenza from Nasopharyngeal swab specimens and  should not be used as a sole basis for treatment. Nasal washings and  aspirates are unacceptable for Xpert Xpress SARS-CoV-2/FLU/RSV  testing. Fact Sheet for Patients: PinkCheek.be Fact Sheet for Healthcare Providers: GravelBags.it This test is not yet approved or cleared by the [place] FDA and  has been authorized for detection and/or diagnosis of SARS-CoV-2 by  FDA under an Emergency Use Authorization (EUA). This EUA will remain  in effect (meaning this test can be used) for the duration of the  Covid-19 declaration under Section 564(b)(1) of the Act, 21  U.S.C. section 360bbb-3(b)(1), unless the authorization is  terminated or revoked. Performed at [facility], [address], [place], [place] [address]   Blood Culture (routine x 2)     Status: None (Preliminary result)   Collection Time: 06/06/19 11:45 PM   Specimen: BLOOD RIGHT FOREARM  Result Value Ref Range Status   Specimen Description BLOOD RIGHT FOREARM  Final   Special Requests   Final    BOTTLES DRAWN AEROBIC AND ANAEROBIC Blood Culture adequate volume   Culture   Final    NO GROWTH 3 DAYS Performed at [facility] [address] [place], [place] [address]    Report Status PENDING  Incomplete  Blood Culture (routine x 2)     Status: None (Preliminary result)   Collection Time: 06/06/19 11:46 PM   Specimen: BLOOD  Result Value Ref Range Status   Specimen Description   Final    BLOOD RIGHT ANTECUBITAL Performed at [facility], [address], [place], [place] [address]    Special Requests   Final    BOTTLES DRAWN AEROBIC AND ANAEROBIC  Blood Culture adequate volume Performed at [facility], [address] [place],  [address]    Culture  Setup Time   Final    GRAM POSITIVE COCCI Gram Stain Report Called to,Read Back By and Verified With: LASHLEY,S AT 0730 ON 06/08/19 BY HUFFINES,S. ANAEROBIC BOTTLE ONLY Performed at [facility], [place] [address], [place], [place] [address]    Culture GRAM POSITIVE COCCI  Final   Report Status PENDING  Incomplete  Urine culture     Status: Abnormal   Collection Time: 06/07/19  2:12 AM   Specimen: Urine, Clean Catch  Result Value Ref Range Status   Specimen Description   Final    URINE, CLEAN CATCH Performed at [facility], [address], [place], [place] [address]    Special Requests   Final    NONE Performed at [facility], [address] [place], [place] [address]    Culture MULTIPLE SPECIES PRESENT, SUGGEST RECOLLECTION (A)  Final   Report Status 06/08/2019 FINAL  Final  MRSA PCR Screening     Status: None   Collection Time: 06/07/19 10:00 AM   Specimen: Nasal Mucosa; Nasopharyngeal  Result Value Ref Range Status   MRSA by PCR NEGATIVE NEGATIVE Final    Comment:        The GeneXpert MRSA Assay (FDA approved for NASAL specimens only), is one component of a comprehensive MRSA colonization surveillance program. It is not intended to diagnose MRSA infection nor to guide or monitor treatment for MRSA infections. Performed at [facility], [address], [place], [place] [address]          Radiology Studies: MR SACRUM SI JOINTS W WO CONTRAST  Result Date: 06/07/2019 CLINICAL DATA:  Low back/buttock/sacral pain with fever and abnormal CT scan. Elevated white blood cell count. EXAM: MRI PELVIS WITHOUT AND WITH CONTRAST TECHNIQUE: Multiplanar multisequence MR imaging of the pelvis was performed both before and after administration of intravenous contrast. CONTRAST:  9m GADAVIST GADOBUTROL 1 MMOL/ML IV SOLN COMPARISON:  CT scan, same date. FINDINGS: There  appears to be an open wound involving the right lower sacral area with fluid contacting the skin. The right gluteus maximus muscle is markedly thickened and demonstrates diffuse myositis. Small fluid collections are noted in the muscle 1 of which demonstrates rim enhancement after contrast consistent with pyomyositis. Do not see a well-formed discrete drainable soft tissue abscess but there is significant focal cellulitis involving the subcutaneous tissues in the right buttock area overlying the gluteus maximus. Abnormal T2 signal intensity and enhancement in the lower sacral segments (3, 4 and 5) is highly suspicious for osteomyelitis. The coccyx segments appear intact. Both hips are normally located. No findings for septic arthritis, osteomyelitis or AVN. The pubic symphysis and SI joints are intact. No evidence of septic arthritis. Mild inflammation/edema in the right adductor muscles suggesting myositis without evidence of pyomyositis. No significant intrapelvic abnormalities are identified. The bladder is grossly normal and the uterus and ovaries are unremarkable. There is a small amount of free pelvic fluid and moderate presacral edema. No rim enhancing intrapelvic abscess is identified. IMPRESSION: 1. Suspect small open wound at the very top of the gluteal fold on the right with underlying focal cellulitis, myofasciitis involving the right gluteus maximus muscle and a small focus of pyomyositis. No drainable abscess is identified. 2. Findings suspicious for sacral osteomyelitis. 3. No findings for septic arthritis and no intrapelvic abscess. Electronically Signed   By: PMarijo SanesM.D.   On: 06/07/2019 09:20   [place]KG SITE RITE  Result Date: 06/09/2019 If Site Rite image not attached, placement could not be confirmed due to current cardiac rhythm.       Scheduled Meds: . enoxaparin (LOVENOX) injection  40 mg Subcutaneous Q24H  . insulin aspart  0-15 Units Subcutaneous TID WC  .  insulin aspart   0-5 Units Subcutaneous QHS  . insulin aspart  6 Units Subcutaneous TID WC  . insulin glargine  30 Units Subcutaneous QHS  . sodium chloride flush  3 mL Intravenous Q12H   Continuous Infusions: . sodium chloride    . sodium chloride 75 mL/hr at 06/09/19 0300  . cefTRIAXone (ROCEPHIN)  IV    . vancomycin 750 mg (06/09/19 0215)     LOS: 2 days    Time spent: 30 minutes    Shawntrice Salle Darleen Crocker, DO [facility] Pager [phone]  If 7PM-7AM, please contact night-coverage [email or web address] Password [facility] 06/09/2019, 8:41 AM

## 2019-06-09 NOTE — TOC Initial Note (Addendum)
Transition of Care [facility]) - Initial/Assessment Note    Patient Details  Name: Lindsey Murillo MRN: [id number] Date of Birth: 09-16-1995  [facility] CM/SW Contact:    Dayveon Halley, Chauncey Reading, RN Phone Number: 06/09/2019, 3:33 PM  Clinical Narrative: Sacral osteomyelitis . From home with mom, will go home with PICC line and IV antibiotics to be administered by patient's mother, Birdie Riddle.   Referred to Pam of [facility]. She is out of network with patient's insurance.  She did provide a number for a potential infusion company, spoke with Merleen Nicely of [facility], she will run [employer] and let CM know if they can accept.              Referred to [facility] [place].     Expected Discharge Plan: [place] Barriers to Discharge: Continued Medical Work up    Expected Discharge Plan and Services Expected Discharge Plan: [place]   Discharge Planning Services: CM Consult Post Acute Care Choice: Home Health                             HH Arranged: RN, IV Antibiotics          Prior Living Arrangements/Services   Lives with:: Parents                   Activities of Daily Living Home Assistive Devices/Equipment: CBG Meter ADL Screening (condition at time of admission) Patient's cognitive ability adequate to safely complete daily activities?: Yes Is the patient deaf or have difficulty hearing?: No Does the patient have difficulty seeing, even when wearing glasses/contacts?: No Does the patient have difficulty concentrating, remembering, or making decisions?: No Patient able to express need for assistance with ADLs?: Yes Does the patient have difficulty dressing or bathing?: No Independently performs ADLs?: Yes (appropriate for developmental age) Does the patient have difficulty walking or climbing stairs?: Yes Weakness of Legs: Both Weakness of Arms/Hands: None          Admission diagnosis:  Osteomyelitis ([place]) [M86.9] Cellulitis of sacral region [L03.319] Sepsis due to cellulitis ([place]) [L03.90, A41.9] Patient Active Problem List   Diagnosis Date Noted  . Osteomyelitis ([place]) 06/07/2019  . Cellulitis of sacral region   . Vitamin D deficiency 03/20/2019  . Dyspepsia   . Weight loss, unintentional 08/26/2016  . Chronic diarrhea 04/09/2016  . Type 1 diabetes mellitus with peripheral circulatory complications ([place]) [id number]  . Noncompliance 02/26/2014  . Essential hypertension, benign 11/30/2012  . Acanthosis nigricans, acquired   . Hypoglycemia associated with diabetes ([place])   . Goiter   . Tachycardia   . Diabetic autonomic neuropathy ([place])   . Diabetic peripheral neuropathy ([place])   . Thyroiditis, autoimmune   . Asthma   . Environmental allergies    PCP:  Mikey Kirschner, MD Pharmacy:   [facility] [address] [place] [address] [place] [address] [place] [address] Phone: [phone] Fax: [phone]     Social Determinants of Health (SDOH) Interventions    Readmission Risk Interventions No flowsheet data found.

## 2019-06-09 NOTE — Progress Notes (Signed)
Spoke with RN. She is aware to notify [facility] for PICC placement.

## 2019-06-09 NOTE — Progress Notes (Signed)
PHARMACY CONSULT NOTE FOR:  OUTPATIENT  PARENTERAL ANTIBIOTIC THERAPY (OPAT)  Indication: sacral osteomyelitis   Regimen: vancomycin 750mg  iv q12h, ceftriaxone 2gm iv q24h  End date: 07/18/19  IV antibiotic discharge orders are pended. To discharging provider:  please sign these orders via discharge navigator,  Select New Orders & click on the button choice - Manage This Unsigned Work.     Thank you for allowing pharmacy to be a part of this patient's care.  Donna Christen Adrien Dietzman 06/09/2019, 8:51 AM

## 2019-06-09 NOTE — Progress Notes (Signed)
  Subjective: Has no complaints of pain over the coccyx.  Objective: Vital Murillo in last 24 hours: Temp:  [97.7 F (36.5 C)-99.1 F (37.3 C)] 97.7 F (36.5 C) (01/30 0511) Pulse Rate:  [102-116] 102 (01/30 0509) Resp:  [16-23] 18 (01/30 0509) BP: (92-115)/(66-88) 115/88 (01/30 0509) SpO2:  [99 %-100 %] 100 % (01/30 0509) Last BM Date: 06/07/19  Intake/Output from previous day: 01/29 0701 - 01/30 0700 In: 897.6 [I.V.:450; IV Piggyback:447.6] Out: -  Intake/Output this shift: No intake/output data recorded.  General appearance: alert, cooperative and no distress Skin: Sacral wound without drainage, erythema, or induration.  No fluctuance present.  Lab Results:  Recent Labs    06/08/19 0831 06/09/19 0520  WBC 22.0* 18.5*  HGB 7.8* 7.7*  HCT 23.8* 24.1*  PLT 308 341   BMET Recent Labs    06/08/19 0831 06/09/19 0520  NA 132* 133*  K 3.9 3.5  CL 97* 98  CO2 24 26  GLUCOSE 134* 110*  BUN 19 20  CREATININE 0.76 0.85  CALCIUM 7.8* 7.9*   PT/INR No results for input(s): LABPROT, INR in the last 72 hours.  Studies/Results: [place] EKG SITE RITE  Result Date: 06/09/2019 If Site Rite image not attached, placement could not be confirmed due to current cardiac rhythm.   Anti-infectives: Anti-infectives (From admission, onward)   Start     Dose/Rate Route Frequency Ordered Stop   06/09/19 0845  cefTRIAXone (ROCEPHIN) 2 g in sodium chloride 0.9 % 100 mL IVPB     2 g 200 mL/hr over 30 Minutes Intravenous Every 24 hours 06/09/19 0835     06/07/19 1500  vancomycin (VANCOREADY) IVPB 750 mg/150 mL     750 mg 150 mL/hr over 60 Minutes Intravenous Every 12 hours 06/07/19 0151     06/07/19 1030  clindamycin (CLEOCIN) IVPB 600 mg  Status:  Discontinued     600 mg 100 mL/hr over 30 Minutes Intravenous Every 8 hours 06/07/19 0741 06/09/19 0835   06/07/19 0730  clindamycin (CLEOCIN) IVPB 600 mg  Status:  Discontinued     600 mg 100 mL/hr over 30 Minutes Intravenous Every 8  hours 06/07/19 0724 06/07/19 0741   06/07/19 0600  ceFEPIme (MAXIPIME) 2 g in sodium chloride 0.9 % 100 mL IVPB  Status:  Discontinued     2 g 200 mL/hr over 30 Minutes Intravenous Every 8 hours 06/07/19 0343 06/09/19 0835   06/07/19 0215  clindamycin (CLEOCIN) IVPB 600 mg     600 mg 100 mL/hr over 30 Minutes Intravenous  Once 06/07/19 0204 06/07/19 0254   06/07/19 0200  vancomycin (VANCOREADY) IVPB 500 mg/100 mL     500 mg 100 mL/hr over 60 Minutes Intravenous  Once 06/07/19 0025 06/07/19 0313   06/07/19 0000  cefTRIAXone (ROCEPHIN) 2 g in sodium chloride 0.9 % 100 mL IVPB     2 g 200 mL/hr over 30 Minutes Intravenous  Once 06/06/19 2333 06/07/19 0042   06/06/19 2345  vancomycin (VANCOCIN) IVPB 1000 mg/200 mL premix     1,000 mg 200 mL/hr over 60 Minutes Intravenous  Once 06/06/19 2333 06/07/19 0108      Assessment/Plan: Impression: Healing sacral wound.  No need for surgical invention at this time.  A PICC line has been ordered for home IV vancomycin.  Would avoid continuous pressure on her sacral wound.  Will sign off.  Discussed with Dr. Manuella Ghazi.  LOS: 2 days    Lindsey Murillo 06/09/2019

## 2019-06-09 NOTE — Progress Notes (Signed)
CBG check 44. Recheck was 42. Pt is asymptomatic at this time. Vitals are stable. Pt was given 8oz of orange juice and a pack of graham crackers. Will recheck CBG in 1 hour.

## 2019-06-10 DIAGNOSIS — L8990 Pressure ulcer of unspecified site, unspecified stage: Secondary | ICD-10-CM | POA: Insufficient documentation

## 2019-06-10 LAB — BASIC METABOLIC PANEL
Anion gap: 10 (ref 5–15)
BUN: 15 mg/dL (ref 6–20)
CO2: 24 mmol/L (ref 22–32)
Calcium: 7.7 mg/dL — ABNORMAL LOW (ref 8.9–10.3)
Chloride: 99 mmol/L (ref 98–111)
Creatinine, Ser: 0.62 mg/dL (ref 0.44–1.00)
GFR calc Af Amer: >60 mL/min (ref >60)
GFR calc non Af Amer: >60 mL/min (ref >60)
Glucose, Bld: 160 mg/dL — ABNORMAL HIGH (ref 70–99)
Potassium: 3.4 mmol/L — ABNORMAL LOW (ref 3.5–5.1)
Sodium: 133 mmol/L — ABNORMAL LOW (ref 135–145)

## 2019-06-10 LAB — CBC
HCT: 23 % — ABNORMAL LOW (ref 36.0–46.0)
Hemoglobin: 7.3 g/dL — ABNORMAL LOW (ref 12.0–15.0)
MCH: 26 pg (ref 26.0–34.0)
MCHC: 31.7 g/dL (ref 30.0–36.0)
MCV: 81.9 fL (ref 80.0–100.0)
Platelets: 303 10*3/uL (ref 150–400)
RBC: 2.81 MIL/uL — ABNORMAL LOW (ref 3.87–5.11)
RDW: 13.9 % (ref 11.5–15.5)
WBC: 13 10*3/uL — ABNORMAL HIGH (ref 4.0–10.5)
nRBC: 0 % (ref 0.0–0.2)

## 2019-06-10 LAB — CULTURE, BLOOD (ROUTINE X 2): Special Requests: ADEQUATE

## 2019-06-10 LAB — GLUCOSE, CAPILLARY
Glucose-Capillary: 127 mg/dL — ABNORMAL HIGH (ref 70–99)
Glucose-Capillary: 108 mg/dL — ABNORMAL HIGH (ref 70–99)
Glucose-Capillary: 126 mg/dL — ABNORMAL HIGH (ref 70–99)
Glucose-Capillary: 148 mg/dL — ABNORMAL HIGH (ref 70–99)

## 2019-06-10 MED ORDER — METOPROLOL TARTRATE 50 MG PO TABS
25.0000 mg | ORAL_TABLET | Freq: Two times a day (BID) | ORAL | Status: DC
  Administered 2019-06-10 – 2019-06-12 (×5): 25 mg via ORAL
  Filled 2019-06-10 (×5): qty 1

## 2019-06-10 MED ORDER — CHLORHEXIDINE GLUCONATE CLOTH 2 % EX PADS
6.0000 | MEDICATED_PAD | Freq: Every day | CUTANEOUS | Status: DC
  Administered 2019-06-10 – 2019-06-12 (×3): 6 via TOPICAL

## 2019-06-10 MED ORDER — POTASSIUM CHLORIDE CRYS ER 20 MEQ PO TBCR
40.0000 meq | EXTENDED_RELEASE_TABLET | Freq: Once | ORAL | Status: AC
  Administered 2019-06-10: 40 meq via ORAL
  Filled 2019-06-10: qty 2

## 2019-06-10 NOTE — Progress Notes (Signed)
Patient's MEWS score 2, MD made awre of patient's increased heartrate

## 2019-06-10 NOTE — Progress Notes (Signed)
   Vital Signs MEWS/VS Documentation      06/09/2019 2123 06/09/2019 2143 06/09/2019 2332 06/10/2019 0006   MEWS Score:  2  2  1  2    MEWS Score Color:  Yellow  Yellow  Green  Yellow   Resp:  18  --  --  18   Pulse:  (!) 112  --  (!) 108  (!) 115   BP:  (!) 133/92  --  --  (!) 130/96   Temp:  98.8 F (37.1 C)  --  --  99 F (37.2 C)   O2 Device:  Room Air  --  --  Room Air   Level of Consciousness: Alert  Alert  --  --     Pt pulse has been trending 100's-110's. This appears to be pt baseline. MD notified per mews guidelines.No new orders at this time. Pt remains stable, denies chest pain/discomfort. Other vital signs stable. Will continue to monitor patient.       Marina Gravel Una Yeomans 06/10/2019,12:27 AM

## 2019-06-10 NOTE — Progress Notes (Signed)
PROGRESS NOTE    Lindsey Murillo  QXI:[id number] DOB: [birth year] DOA: 06/06/2019 PCP: Mikey Kirschner, MD   Brief Narrative:  Per HPI: Lindsey Atkin Murchisonis a 24 y.o.femalewith medical history significant oftype 1 diabetes who presented to the ER with increased back pain.Patient states she had increased pain over the lower back and sacral region over the last 2 days. She is had difficulty in the sitting position due to increased pain. Today she started having a fever with chills. Currently not on antibiotics. Has a history of diabetes mellitus type 1 which is uncontrolled. No prior history of spinal abscesses, osteomyelitis. ED Course: Vital Signs reviewed on presentation, significant fortemperature 101.1, heart rate 125, blood pressure 143/104, saturation 99% room air. Labs reviewed, significant forsodium 130, potassium 4.1, chloride 95, glucose 234, BUN 19, creatinine 0.9, albumin 2.0, LFTs within normal meds, lactic acid 0.9, WBC count 20.9, hemoglobin 8.5, hematocrit 26, platelets 342, pregnancy test is negative. Flu PCR negative, SARS Covid RT-PCR negative, urinalysis negative, blood and urine cultures have been sent. Imaging personally Reviewed,CT of the abdomen pelvis with contrast shows diffuse anasarca seen throughout the external soft tissue with focal soft tissue swelling and inflammatory stranding involving the subcutaneous fat posterior to the sacrum concerning for infection/cellulitis. Overlying skin thickening. Inflammatory changes are fairly ill-defined at this time, most consistent with phlegmon. No definite discrete abscess or drainable fluid collection. Small focus of probable gas within the region which could reflect infection with gas-forming organism or possible communication with overlying skin. Evidence for osseous erosion of the underlying sacrum at approximately S3-S4 segment highly suggestive of osteomyelitis. EKG personally reviewed, showssinus  tachycardia, no acute ST-T changes.  1/28:Patient seen and evaluated at bedside this morning. She has just had MRI demonstrating some osteomyelitis. General surgery consultation pending. Continue on current IV antibiotics monitor blood cultures with no growth noted thus far. Obtain wound culture. She is noted to have some blurry vision and will try eyedrops for now. No other neurological deficits identified. We will plan to check cultures and see if any surgical cultures could be obtained as well. Plan to discuss with ID plans for IV antibiotic treatment in the next 48 hours. Plan to keep at this facility with no need for transfer noted.  1/29:Patient seen by general surgery with plan to continue current IV antibiotics. She appears to be doing somewhat better overnight and is okay for transfer to telemetry today. Blood cultures with gram-positive cocci in 1 set which is likely contaminant. Fluid collection could not be seen or drained in the sacral region at this time. Monitor blood cultures for ID and sensitivity to ensure that this is contaminant and monitor urine repeat CBC. We will plan to discuss with ID in the next 1-2 days.  1/30: Patient overall doing well this morning with no fever overnight.  She states that the pain in her low back has improved and leukocytosis is improving as well.  We will recheck ESR and CRP.  Blood culture appears to be contaminant.  Will order placement of PICC line and plan for outpatient IV antibiotic treatment for 6 weeks with Rocephin and vancomycin after discussion with ID.  She will also need weekly ESR and CRP.  Plan for PT evaluation today as well.  Monitor for another 24 hours and try to assess for discharge in a.m.  1/31: Patient doing well this morning with PICC line still pending.  Insurance company states that home health will not be set up for infusions until  2/1.  We will continue to monitor labs.  Replete potassium and recheck in a.m.  ESR  and CRP downtrending.  PT evaluation pending prior to discharge.  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 to ensure further resolution of leukocytosis as well as CRP and ESR -Appreciate general surgery evaluation with no plans for debridement at this time -Discussed with ID who recommends vancomycin along with Rocephin 2 g daily for the next 6 weeks.  PICC line order placed and placement still pending.  Type 1 diabetes-improved control -Continue mealtime insulin -Continue SSI  Chronic normocytic anemia-stable -Likely anemia of chronic disease -Anemia panel without significant findings   DVT prophylaxis:Lovenox Code Status:Full Family Communication: Discussed with mother 1/29 Disposition Plan: Continue now on vancomycin and Rocephin only.  Plan for PICC line today.  Outpatient antibiotics with vancomycin and Rocephin to be arranged by pharmacy.  PT evaluation today.  Recheck labs in a.m. and anticipate discharge soon thereafter.   Consultants:  General surgery  ID  Procedures:  None  Antimicrobials:  Anti-infectives (From admission, onward)   Start     Dose/Rate Route Frequency Ordered Stop   06/09/19 0845  cefTRIAXone (ROCEPHIN) 2 g in sodium chloride 0.9 % 100 mL IVPB     2 g 200 mL/hr  over 30 Minutes Intravenous Every 24 hours 06/09/19 0835     06/09/19 0000  cefTRIAXone (ROCEPHIN) IVPB     2 g Intravenous Every 24 hours 06/09/19 1543     06/09/19 0000  vancomycin IVPB     750 mg Intravenous Every 12 hours 06/09/19 1543     06/07/19 1500  vancomycin (VANCOREADY) IVPB 750 mg/150 mL     750 mg 150 mL/hr over 60 Minutes Intravenous Every 12 hours 06/07/19 0151     06/07/19 1030  clindamycin (CLEOCIN) IVPB 600 mg  Status:  Discontinued     600 mg 100 mL/hr over 30 Minutes Intravenous Every 8 hours 06/07/19 0741 06/09/19 0835   06/07/19 0730  clindamycin (CLEOCIN) IVPB 600 mg  Status:  Discontinued     600 mg 100 mL/hr over 30 Minutes Intravenous Every 8 hours 06/07/19 0724 06/07/19 0741   06/07/19 0600  ceFEPIme (MAXIPIME) 2 g in sodium chloride 0.9 % 100 mL IVPB  Status:  Discontinued     2 g 200 mL/hr over 30 Minutes Intravenous Every 8 hours 06/07/19 0343 06/09/19 0835   06/07/19 0215  clindamycin (CLEOCIN) IVPB 600 mg     600 mg 100 mL/hr over 30 Minutes Intravenous  Once 06/07/19 0204 06/07/19 0254   06/07/19 0200  vancomycin (VANCOREADY) IVPB 500 mg/100 mL     500 mg 100 mL/hr over 60 Minutes Intravenous  Once 06/07/19 0025 06/07/19 0313   06/07/19 0000  cefTRIAXone (ROCEPHIN) 2 g in sodium chloride 0.9 % 100 mL IVPB     2 g  200 mL/hr over 30 Minutes Intravenous  Once 06/06/19 2333 06/07/19 0042   06/06/19 2345  vancomycin (VANCOCIN) IVPB 1000 mg/200 mL premix     1,000 mg 200 mL/hr over 60 Minutes Intravenous  Once 06/06/19 2333 06/07/19 0108       Subjective: Patient seen and evaluated today with no new acute complaints or concerns. No acute concerns or events noted overnight.  She denies any low back pain.  No fever overnight.  Continues to have mild tachycardia which is at her baseline.  Objective: Vitals:   06/10/19 0519 06/10/19 0600 06/10/19 0700 06/10/19 0800  BP: 137/90   140/90  Pulse: (!) 119  (!) 112 (!) 111  Resp: 17   18  Temp: 98.1 F  (36.7 C)     TempSrc: Oral     SpO2: 100%     Weight:  54.9 kg    Height:        Intake/Output Summary (Last 24 hours) at 06/10/2019 1108 Last data filed at 06/10/2019 0600 Gross per 24 hour  Intake 1293 ml  Output --  Net 1293 ml   Filed Weights   06/07/19 1127 06/08/19 0500 06/10/19 0600  Weight: 54.5 kg 54.1 kg 54.9 kg    Examination:  General exam: Appears calm and comfortable  Respiratory system: Clear to auscultation. Respiratory effort normal. Cardiovascular system: S1 & S2 heard, RRR. No JVD, murmurs, rubs, gallops or clicks. No pedal edema. Gastrointestinal system: Abdomen is nondistended, soft and nontender. No organomegaly or masses felt. Normal bowel sounds heard. Central nervous system: Alert and oriented. No focal neurological deficits. Extremities: Symmetric 5 x 5 power. Skin: No rashes, lesions or ulcers Psychiatry: Judgement and insight appear normal. Mood & affect appropriate.     Data Reviewed: I have personally reviewed following labs and imaging studies  CBC: Recent Labs  Lab 06/06/19 2343 06/07/19 0606 06/08/19 0831 06/09/19 0520 06/10/19 0609  WBC 20.9* 20.2* 22.0* 18.5* 13.0*  HGB 8.5* 9.4* 7.8* 7.7* 7.3*  HCT 26.6* 28.6* 23.8* 24.1* 23.0*  MCV 81.1 81.5 81.5 82.8 81.9  PLT 342 288 308 341 517   Basic Metabolic Panel: Recent Labs  Lab 06/06/19 2343 06/07/19 0606 06/08/19 0831 06/09/19 0520 06/10/19 0609  NA 130* 130* 132* 133* 133*  K 4.1 3.9 3.9 3.5 3.4*  CL 95* 95* 97* 98 99  CO2 26 25 24 26 24   GLUCOSE 234* 231* 134* 110* 160*  BUN 19 17 19 20 15   CREATININE 0.99 0.80 0.76 0.85 0.62  CALCIUM 8.3* 7.9* 7.8* 7.9* 7.7*   GFR: Estimated Creatinine Clearance: 94 mL/min (by C-G formula based on SCr of 0.62 mg/dL). Liver Function Tests: Recent Labs  Lab 06/06/19 2343  AST 55*  ALT 36  ALKPHOS 433*  BILITOT 0.4  PROT 8.1  ALBUMIN 2.0*   No results for input(s): LIPASE, AMYLASE in the last 168 hours. No results for  input(s): AMMONIA in the last 168 hours. Coagulation Profile: No results for input(s): INR, PROTIME in the last 168 hours. Cardiac Enzymes: No results for input(s): CKTOTAL, CKMB, CKMBINDEX, TROPONINI in the last 168 hours. BNP (last 3 results) No results for input(s): PROBNP in the last 8760 hours. HbA1C: No results for input(s): HGBA1C in the last 72 hours. CBG: Recent Labs  Lab 06/09/19 1613 06/09/19 2117 06/09/19 2119 06/09/19 2222 06/10/19 0800  GLUCAP 104* 44* 42* 87 127*   Lipid Profile: No results for input(s): CHOL, HDL, LDLCALC, TRIG, CHOLHDL, LDLDIRECT in the last 72 hours.  Thyroid Function Tests: No results for input(s): TSH, T4TOTAL, FREET4, T3FREE, THYROIDAB in the last 72 hours. Anemia Panel: No results for input(s): VITAMINB12, FOLATE, FERRITIN, TIBC, IRON, RETICCTPCT in the last 72 hours. Sepsis Labs: Recent Labs  Lab 06/06/19 2345 06/07/19 0153  PROCALCITON  --  0.31  LATICACIDVEN 0.9 0.7    Recent Results (from the past 240 hour(s))  Respiratory Panel by RT PCR (Flu A&B, Covid) - Nasopharyngeal Swab     Status: None   Collection Time: 06/06/19 11:41 PM   Specimen: Nasopharyngeal Swab  Result Value Ref Range Status   SARS Coronavirus 2 by RT PCR NEGATIVE NEGATIVE Final    Comment: (NOTE) SARS-CoV-2 target nucleic acids are NOT DETECTED. The SARS-CoV-2 RNA is generally detectable in upper respiratoy specimens during the acute phase of infection. The lowest concentration of SARS-CoV-2 viral copies this assay can detect is 131 copies/mL. A negative result does not preclude SARS-Cov-2 infection and should not be used as the sole basis for treatment or other patient management decisions. A negative result may occur with  improper specimen collection/handling, submission of specimen other than nasopharyngeal swab, presence of viral mutation(s) within the areas targeted by this assay, and inadequate number of viral copies (<131 copies/mL). A negative  result must be combined with clinical observations, patient history, and epidemiological information. The expected result is Negative. Fact Sheet for Patients:  PinkCheek.be Fact Sheet for Healthcare Providers:  GravelBags.it This test is not yet ap proved or cleared by the [place] FDA and  has been authorized for detection and/or diagnosis of SARS-CoV-2 by FDA under an Emergency Use Authorization (EUA). This EUA will remain  in effect (meaning this test can be used) for the duration of the COVID-19 declaration under Section 564(b)(1) of the Act, 21 U.S.C. section 360bbb-3(b)(1), unless the authorization is terminated or revoked sooner.    Influenza A by PCR NEGATIVE NEGATIVE Final   Influenza B by PCR NEGATIVE NEGATIVE Final    Comment: (NOTE) The Xpert Xpress SARS-CoV-2/FLU/RSV assay is intended as an aid in  the diagnosis of influenza from Nasopharyngeal swab specimens and  should not be used as a sole basis for treatment. Nasal washings and  aspirates are unacceptable for Xpert Xpress SARS-CoV-2/FLU/RSV  testing. Fact Sheet for Patients: PinkCheek.be Fact Sheet for Healthcare Providers: GravelBags.it This test is not yet approved or cleared by the [place] FDA and  has been authorized for detection and/or diagnosis of SARS-CoV-2 by  FDA under an Emergency Use Authorization (EUA). This EUA will remain  in effect (meaning this test can be used) for the duration of the  Covid-19 declaration under Section 564(b)(1) of the Act, 21  U.S.C. section 360bbb-3(b)(1), unless the authorization is  terminated or revoked. Performed at [facility], [address] [place], [place] [address]   Blood Culture (routine x 2)     Status: None (Preliminary result)   Collection Time: 06/06/19 11:45 PM   Specimen: BLOOD RIGHT FOREARM  Result Value Ref Range Status    Specimen Description BLOOD RIGHT FOREARM  Final   Special Requests   Final    BOTTLES DRAWN AEROBIC AND ANAEROBIC Blood Culture adequate volume   Culture   Final    NO GROWTH 4 DAYS Performed at [facility], [address], [place], [place] [address]    Report Status PENDING  Incomplete  Blood Culture (routine x 2)     Status: Abnormal   Collection Time: 06/06/19 11:46 PM   Specimen: BLOOD  Result Value Ref Range Status   Specimen Description   Final    BLOOD RIGHT ANTECUBITAL Performed at [facility], [address] [place], [place] [address]    Special Requests   Final    BOTTLES DRAWN AEROBIC AND ANAEROBIC Blood Culture adequate volume Performed at [facility], [address], [place], [place] [address]    Culture  Setup Time   Final    GRAM POSITIVE COCCI Gram Stain Report Called to,Read Back By and Verified With: LASHLEY,S AT 0730 ON 06/08/19 BY HUFFINES,S. ANAEROBIC BOTTLE ONLY Performed at [facility], [place] [address], [place], [place] [address]    Culture STREPTOCOCCUS GROUP F (A)  Final   Report Status 06/10/2019 FINAL  Final   Organism ID, Bacteria STREPTOCOCCUS GROUP F  Final      Susceptibility   Streptococcus group f - MIC*    PENICILLIN 0.25 INTERMEDIATE Intermediate     CEFTRIAXONE 1 SENSITIVE Sensitive     ERYTHROMYCIN <=0.12 SENSITIVE Sensitive     LEVOFLOXACIN <=0.25 SENSITIVE Sensitive     VANCOMYCIN 0.5 SENSITIVE Sensitive     * STREPTOCOCCUS GROUP F  Urine culture     Status: Abnormal   Collection Time: 06/07/19  2:12 AM   Specimen: Urine, Clean Catch  Result Value Ref Range Status   Specimen Description   Final    URINE, CLEAN CATCH Performed at [facility], [address] [place], [place] [address]    Special Requests   Final    NONE Performed at [facility], [address], [place], [place] [address]    Culture MULTIPLE SPECIES PRESENT, SUGGEST RECOLLECTION (A)  Final   Report Status 06/08/2019 FINAL  Final  MRSA PCR Screening      Status: None   Collection Time: 06/07/19 10:00 AM   Specimen: Nasal Mucosa; Nasopharyngeal  Result Value Ref Range Status   MRSA by PCR NEGATIVE NEGATIVE Final    Comment:        The GeneXpert MRSA Assay (FDA approved for NASAL specimens only), is one component of a comprehensive MRSA colonization surveillance program. It is not intended to diagnose MRSA infection nor to guide or monitor treatment for MRSA infections. Performed at [facility], [address], [place], [place] [address]          Radiology Studies: [place] EKG SITE RITE  Result Date: 06/09/2019 If Site Rite image not attached, placement could not be confirmed due to current cardiac rhythm.       Scheduled Meds: . enoxaparin (LOVENOX) injection  40 mg Subcutaneous Q24H  . insulin aspart  0-15 Units Subcutaneous TID WC  . insulin aspart  0-5 Units Subcutaneous QHS  . insulin aspart  6 Units Subcutaneous TID WC  . insulin glargine  30 Units Subcutaneous QHS  . sodium chloride flush  3 mL Intravenous Q12H   Continuous Infusions: . sodium chloride    . sodium chloride 75 mL/hr at 06/10/19 0600  . cefTRIAXone (ROCEPHIN)  IV 2 g (06/10/19 0837)  . vancomycin 750 mg (06/10/19 0403)     LOS: 3 days    Time spent: 30 minutes    Quanta Robertshaw Darleen Crocker, DO [facility] Pager [phone]  If 7PM-7AM, please contact night-coverage [email or web address] Password [email or web address] 06/10/2019, 11:08 AM

## 2019-06-10 NOTE — Progress Notes (Signed)
MD informed of patient's continued increased heart rate and blood pressure, orders given

## 2019-06-11 ENCOUNTER — Ambulatory Visit: Payer: PRIVATE HEALTH INSURANCE | Admitting: Orthopedic Surgery

## 2019-06-11 LAB — BASIC METABOLIC PANEL
Anion gap: 7 (ref 5–15)
BUN: 11 mg/dL (ref 6–20)
CO2: 25 mmol/L (ref 22–32)
Calcium: 7.6 mg/dL — ABNORMAL LOW (ref 8.9–10.3)
Chloride: 101 mmol/L (ref 98–111)
Creatinine, Ser: 0.57 mg/dL (ref 0.44–1.00)
GFR calc Af Amer: >60 mL/min (ref >60)
GFR calc non Af Amer: >60 mL/min (ref >60)
Glucose, Bld: 78 mg/dL (ref 70–99)
Potassium: 3.7 mmol/L (ref 3.5–5.1)
Sodium: 133 mmol/L — ABNORMAL LOW (ref 135–145)

## 2019-06-11 LAB — CBC WITH DIFFERENTIAL/PLATELET
Abs Immature Granulocytes: 0.08 10*3/uL — ABNORMAL HIGH (ref 0.00–0.07)
Basophils Absolute: 0 10*3/uL (ref 0.0–0.1)
Basophils Relative: 0 %
Eosinophils Absolute: 0.1 10*3/uL (ref 0.0–0.5)
Eosinophils Relative: 1 %
HCT: 27.8 % — ABNORMAL LOW (ref 36.0–46.0)
Hemoglobin: 8.9 g/dL — ABNORMAL LOW (ref 12.0–15.0)
Immature Granulocytes: 1 %
Lymphocytes Relative: 15 %
Lymphs Abs: 2 10*3/uL (ref 0.7–4.0)
MCH: 26.9 pg (ref 26.0–34.0)
MCHC: 32 g/dL (ref 30.0–36.0)
MCV: 84 fL (ref 80.0–100.0)
Monocytes Absolute: 0.9 10*3/uL (ref 0.1–1.0)
Monocytes Relative: 7 %
Neutro Abs: 10.9 10*3/uL — ABNORMAL HIGH (ref 1.7–7.7)
Neutrophils Relative %: 76 %
Platelets: 300 10*3/uL (ref 150–400)
RBC: 3.31 MIL/uL — ABNORMAL LOW (ref 3.87–5.11)
RDW: 14.4 % (ref 11.5–15.5)
WBC: 14.1 10*3/uL — ABNORMAL HIGH (ref 4.0–10.5)
nRBC: 0 % (ref 0.0–0.2)

## 2019-06-11 LAB — MAGNESIUM: Magnesium: 0.9 mg/dL — CL (ref 1.7–2.4)

## 2019-06-11 LAB — GLUCOSE, CAPILLARY
Glucose-Capillary: 44 mg/dL — CL (ref 70–99)
Glucose-Capillary: 38 mg/dL — CL (ref 70–99)
Glucose-Capillary: 42 mg/dL — CL (ref 70–99)
Glucose-Capillary: 115 mg/dL — ABNORMAL HIGH (ref 70–99)
Glucose-Capillary: 42 mg/dL — CL (ref 70–99)
Glucose-Capillary: 42 mg/dL — CL (ref 70–99)

## 2019-06-11 LAB — CBC
HCT: 19.7 % — ABNORMAL LOW (ref 36.0–46.0)
Hemoglobin: 6.3 g/dL — CL (ref 12.0–15.0)
MCH: 26.6 pg (ref 26.0–34.0)
MCHC: 32 g/dL (ref 30.0–36.0)
MCV: 83.1 fL (ref 80.0–100.0)
Platelets: 277 10*3/uL (ref 150–400)
RBC: 2.37 MIL/uL — ABNORMAL LOW (ref 3.87–5.11)
RDW: 14.3 % (ref 11.5–15.5)
WBC: 13.5 10*3/uL — ABNORMAL HIGH (ref 4.0–10.5)
nRBC: 0 % (ref 0.0–0.2)

## 2019-06-11 LAB — ABO/RH: ABO/RH(D): A POS

## 2019-06-11 LAB — C-REACTIVE PROTEIN: CRP: 7.9 mg/dL — ABNORMAL HIGH (ref <1.0)

## 2019-06-11 LAB — SEDIMENTATION RATE: Sed Rate: 130 mm/hr — ABNORMAL HIGH (ref 0–22)

## 2019-06-11 LAB — PREPARE RBC (CROSSMATCH)

## 2019-06-11 LAB — VANCOMYCIN, TROUGH: Vancomycin Tr: 20 ug/mL (ref 15–20)

## 2019-06-11 MED ORDER — INSULIN ASPART 100 UNIT/ML ~~LOC~~ SOLN: 0.0000 [IU] | Freq: Every day | SUBCUTANEOUS | Status: DC

## 2019-06-11 MED ORDER — INSULIN ASPART 100 UNIT/ML ~~LOC~~ SOLN: 3.0000 [IU] | Freq: Three times a day (TID) | SUBCUTANEOUS | Status: DC

## 2019-06-11 MED ORDER — INSULIN GLARGINE 100 UNIT/ML ~~LOC~~ SOLN
15.0000 [IU] | Freq: Every day | SUBCUTANEOUS | Status: DC
  Filled 2019-06-11 (×2): qty 0.15

## 2019-06-11 MED ORDER — INSULIN ASPART 100 UNIT/ML ~~LOC~~ SOLN: 0.0000 [IU] | Freq: Three times a day (TID) | SUBCUTANEOUS | Status: DC

## 2019-06-11 MED ORDER — SODIUM CHLORIDE 0.9% IV SOLUTION: Freq: Once | INTRAVENOUS | Status: DC

## 2019-06-11 MED ORDER — MAGNESIUM SULFATE 2 GM/50ML IV SOLN
2.0000 g | INTRAVENOUS | Status: AC
  Administered 2019-06-11 (×2): 2 g via INTRAVENOUS
  Filled 2019-06-11 (×2): qty 50

## 2019-06-11 NOTE — Progress Notes (Signed)
Inpatient Diabetes Program Recommendations  AACE/ADA: New Consensus Statement on Inpatient Glycemic Control (2015)  Target Ranges:  Prepandial:   less than 140 mg/dL      Peak postprandial:   less than 180 mg/dL (1-2 hours)      Critically ill patients:  140 - 180 mg/dL   Lab Results  Component Value Date   GLUCAP 115 (H) 06/11/2019   HGBA1C 9.3 (H) 05/09/2019    Review of Glycemic Control  Diabetes history: DM1? Outpatient Diabetes medications: Basaglar 30 units QHS, Novolog 8-11 units tidwc Current orders for Inpatient glycemic control: Lantus 15 units QHS, Novolog 0-9 units tidwc and 0-5 units QHS + 3 units tidwc  HgbA1C - 9.3% On 1/31 - received Lantus 30 units QHS (first basal insulin since admission) and total Novolog of 4 units throughout the day. Hypoglycemia 38 and 42 mg/dL. Ate 50% of dinner.  Inpatient Diabetes Program Recommendations:     Consider checking c-peptide to see if pt is making any insulin (? Type 1 vs Type 2) Agree with orders.   Spoke with pt on phone this morning about her diabetes control PTA and questioned as to whether pt was taking insulin brought from home, as pt had not received any basal or meal coverage first 2 days of admission. First dose Lantus received last night. Pt states she doesn't take her Lantus if she has low blood sugars. Was pt of Dr Dorris Fetch, Endocrinologist, and last appt was 03/20/19. Seems to need much encouragement to control blood sugars.   Will f/u in am.  Thank you. Lorenda Peck, RD, LDN, CDE Inpatient Diabetes Coordinator [phone]

## 2019-06-11 NOTE — Progress Notes (Signed)
Upright in bed respirations even and unlabored. No s/s of acute distress. Responds to verbal stimuli. Call light in reach. Will continue to monitor.

## 2019-06-11 NOTE — Progress Notes (Signed)
Pt A&O x4 awake, talking to her mother at bedside and eating lunch. No c/o pain or discomfort. No s/s of distress. Physician made aware of pts BG and current status.Will continue to monitor for change in LOC. Call light in reach, bed in lowest position.

## 2019-06-11 NOTE — TOC Progression Note (Signed)
Transition of Care [facility]) - Progression Note    Patient Details  Name: NAKIA ELSBERRY MRN: [id number] Date of Birth: Oct 04, 1995  Transition of Care [facility]) CM/SW Contact  Shade Flood, LCSW Phone Number: 06/11/2019, 2:19 PM  Clinical Narrative:     TOC following. Pt not yet medically stable for dc. Spoke with Mickel Baas at [facility] [phone]) and they are able to provide for pt's needs at dc. Per Mickel Baas, they received auth from pt's insurance this AM. Updated Mickel Baas and faxed requested lab work.  Discussed timing of pt's vanc dosing with MD and Pharmacist as it is currently being given at 3A and 3P which is not ideal for home administration. Remo Lipps in Pharmacy stated that after pt's Vanc trough today, he will back up the times of administration to get them closer to 6 and 6.  TOC will follow.   Expected Discharge Plan: [place] Barriers to Discharge: Continued Medical Work up  Expected Discharge Plan and Services Expected Discharge Plan: [place]   Discharge Planning Services: CM Consult Post Acute Care Choice: Home Health                             HH Arranged: RN, IV Antibiotics           Social Determinants of Health (SDOH) Interventions    Readmission Risk Interventions No flowsheet data found.

## 2019-06-11 NOTE — Progress Notes (Signed)
CRITICAL VALUE ALERT  Critical Value:  Mag 0.9  Date & Time Notied:  06/11/2019 at 0621  Provider Notified: Scherrie November  Orders Received/Actions taken: Waiting

## 2019-06-11 NOTE — Progress Notes (Signed)
CRITICAL VALUE ALERT  Critical Value:  SSTREPTOCOCCUS GROUP F found in blood   Date & Time Notied:  06/11/19 @ 0315  Provider Notified: Scherrie November  Orders Received/Actions taken: No new orders at this time. Will continue to monitor patient.

## 2019-06-11 NOTE — Evaluation (Signed)
Physical Therapy Evaluation Patient Details Name: Lindsey Murillo MRN: [id number] DOB: 1995-05-31 Today's Date: 06/11/2019   History of Present Illness  Lindsey Murillo is a 24 y.o. female with medical history significant of type 1 diabetes who presented to the ER with increased back pain.  Patient states she had increased pain over the lower back and sacral region over the last 2 days.  She is had difficulty in the sitting position due to increased pain.  Today she started having a fever with chills.  Currently not on antibiotics.  Has a history of diabetes mellitus type 1 which is uncontrolled.  No prior history of spinal abscesses, osteomyelitis.    Clinical Impression  Patient functioning near baseline for functional mobility and gait, other than having to occasionally lean on near objects for support during transfers and ambulation once fatigued.  Patient demonstrated limited right ankle dorsiflexion when walking and states she usually wears a boot for RLE.  Patient encouraged to ambulate with family members or nursing staff daily as tolerated.  Patient will benefit from continued physical therapy in hospital and recommended venue below to increase strength, balance, endurance for safe ADLs and gait.     Follow Up Recommendations No PT follow up    Equipment Recommendations  None recommended by PT    Recommendations for Other Services       Precautions / Restrictions Precautions Precautions: Fall Restrictions Weight Bearing Restrictions: No      Mobility  Bed Mobility Overal bed mobility: Modified Independent             General bed mobility comments: HOB rasied  Transfers Overall transfer level: Needs assistance Equipment used: None Transfers: Sit to/from [employer] Transfers Sit to Stand: Modified independent (Device/Increase time) Stand pivot transfers: Supervision;Min guard       General transfer comment: occasionally has to lean on nearby  objects for support especially once fatigued  Ambulation/Gait Ambulation/Gait assistance: Supervision;Min guard Gait Distance (Feet): 150 Feet Assistive device: None Gait Pattern/deviations: WFL(Within Functional Limits);Decreased dorsiflexion - right;Drifts right/left Gait velocity: near normal   General Gait Details: slighly unsteady with occasional veering left/right with near loss of balance, able to recover by leaning on nearby objects for support  Stairs            Wheelchair Mobility    Modified Rankin (Stroke Patients Only)       Balance Overall balance assessment: Mild deficits observed, not formally tested                                           Pertinent Vitals/Pain Pain Assessment: Faces Faces Pain Scale: Hurts a little bit Pain Location: sacral area Pain Descriptors / Indicators: Sore Pain Intervention(s): Limited activity within patient's tolerance;Monitored during session;Premedicated before session    Home Living Family/patient expects to be discharged to:: Private residence Living Arrangements: Parent;Other relatives Available Help at Discharge: Family;Available 24 hours/day Type of Home: House Home Access: Stairs to enter Entrance Stairs-Rails: None Entrance Stairs-Number of Steps: 2 Home Layout: One level Home Equipment: None      Prior Function Level of Independence: Independent         Comments: Household and short distanced community ambulator     Hand Dominance        Extremity/Trunk Assessment   Upper Extremity Assessment Upper Extremity Assessment: Overall WFL for tasks assessed  Lower Extremity Assessment Lower Extremity Assessment: Generalized weakness;RLE deficits/detail RLE Deficits / Details: grossly 4+/5 except ankle dorsiflexion 3+/5 RLE Sensation: WNL RLE Coordination: WNL    Cervical / Trunk Assessment Cervical / Trunk Assessment: Normal  Communication   Communication: No difficulties   Cognition Arousal/Alertness: Awake/alert Behavior During Therapy: WFL for tasks assessed/performed Overall Cognitive Status: Within Functional Limits for tasks assessed                                        General Comments      Exercises     Assessment/Plan    PT Assessment Patient needs continued PT services  PT Problem List Decreased strength;Decreased activity tolerance;Decreased balance;Decreased mobility       PT Treatment Interventions Balance training;Gait training;Stair training;Functional mobility training;Therapeutic activities;Therapeutic exercise;Patient/family education    PT Goals (Current goals can be found in the Care Plan section)  Acute Rehab PT Goals Patient Stated Goal: return home with family to assist PT Goal Formulation: With patient/family Time For Goal Achievement: 06/15/19 Potential to Achieve Goals: Good    Frequency Min 2X/week   Barriers to discharge        Co-evaluation               AM-PAC PT "6 Clicks" Mobility  Outcome Measure Help needed turning from your back to your side while in a flat bed without using bedrails?: None Help needed moving from lying on your back to sitting on the side of a flat bed without using bedrails?: None Help needed moving to and from a bed to a chair (including a wheelchair)?: None Help needed standing up from a chair using your arms (e.g., wheelchair or bedside chair)?: None Help needed to walk in hospital room?: A Little Help needed climbing 3-5 steps with a railing? : A Little 6 Click Score: 22    End of Session   Activity Tolerance: Patient tolerated treatment well;Patient limited by fatigue Patient left: in bed;with call bell/phone within reach;with family/visitor present Nurse Communication: Mobility status PT Visit Diagnosis: Unsteadiness on feet (R26.81);Other abnormalities of gait and mobility (R26.89)    Time: [id number] PT Time Calculation (min) (ACUTE ONLY): 27  min   Charges:   PT Evaluation $PT Eval Moderate Complexity: 1 Mod PT Treatments $Therapeutic Activity: 23-37 mins        3:39 PM, 06/11/19 Lonell Grandchild, MPT Physical Therapist with [facility] [phone] office [phone] mobile phone

## 2019-06-11 NOTE — Progress Notes (Addendum)
CBG 42. Orange Juice and peanut butter crackers given. Lantus Insulin held at this time. Will recheck CBG after patient has finished juice and crackers. Mid-level notified.

## 2019-06-11 NOTE — Progress Notes (Signed)
Magnesium 2g 38mL is currently running at 23mL/hr.

## 2019-06-11 NOTE — Progress Notes (Signed)
PROGRESS NOTE    Lindsey Murillo  BHA:[id number] DOB: 1996-01-17 DOA: 06/06/2019 PCP: Mikey Kirschner, MD   Brief Narrative:  Per HPI: Lindsey Murillo Murchisonis a 24 y.o.femalewith medical history significant oftype 1 diabetes who presented to the ER with increased back pain.Patient states she had increased pain over the lower back and sacral region over the last 2 days. She is had difficulty in the sitting position due to increased pain. Today she started having a fever with chills. Currently not on antibiotics. Has a history of diabetes mellitus type 1 which is uncontrolled. No prior history of spinal abscesses, osteomyelitis. ED Course: Vital Signs reviewed on presentation, significant fortemperature 101.1, heart rate 125, blood pressure 143/104, saturation 99% room air. Labs reviewed, significant forsodium 130, potassium 4.1, chloride 95, glucose 234, BUN 19, creatinine 0.9, albumin 2.0, LFTs within normal meds, lactic acid 0.9, WBC count 20.9, hemoglobin 8.5, hematocrit 26, platelets 342, pregnancy test is negative. Flu PCR negative, SARS Covid RT-PCR negative, urinalysis negative, blood and urine cultures have been sent. Imaging personally Reviewed,CT of the abdomen pelvis with contrast shows diffuse anasarca seen throughout the external soft tissue with focal soft tissue swelling and inflammatory stranding involving the subcutaneous fat posterior to the sacrum concerning for infection/cellulitis. Overlying skin thickening. Inflammatory changes are fairly ill-defined at this time, most consistent with phlegmon. No definite discrete abscess or drainable fluid collection. Small focus of probable gas within the region which could reflect infection with gas-forming organism or possible communication with overlying skin. Evidence for osseous erosion of the underlying sacrum at approximately S3-S4 segment highly suggestive of osteomyelitis. EKG personally reviewed, showssinus  tachycardia, no acute ST-T changes.  1/28:Patient seen and evaluated at bedside this morning. She has just had MRI demonstrating some osteomyelitis. General surgery consultation pending. Continue on current IV antibiotics monitor blood cultures with no growth noted thus far. Obtain wound culture. She is noted to have some blurry vision and will try eyedrops for now. No other neurological deficits identified. We will plan to check cultures and see if any surgical cultures could be obtained as well. Plan to discuss with ID plans for IV antibiotic treatment in the next 48 hours. Plan to keep at this facility with no need for transfer noted.  1/29:Patient seen by general surgery with plan to continue current IV antibiotics. She appears to be doing somewhat better overnight and is okay for transfer to telemetry today. Blood cultures with gram-positive cocci in 1 set which is likely contaminant. Fluid collection could not be seen or drained in the sacral region at this time. Monitor blood cultures for ID and sensitivity to ensure that this is contaminant and monitor urine repeat CBC. We will plan to discuss with ID in the next 1-2 days.  1/30:Patient overall doing well this morning with no fever overnight. She states that the pain in her low back has improved and leukocytosis is improving as well. We will recheck ESR and CRP. Blood culture appears to be contaminant. Will order placement of PICC line and plan for outpatient IV antibiotic treatment for 6 weeks with Rocephin and vancomycin after discussion with ID. She will also need weekly ESR and CRP. Plan for PT evaluation today as well. Monitor for another 24 hours and try to assess for discharge in a.m.  1/31: Patient doing well this morning with PICC line still pending.  Insurance company states that home health will not be set up for infusions until 2/1.  We will continue to monitor labs.  Replete potassium and recheck in a.m.  ESR  and CRP downtrending.  PT evaluation pending prior to discharge.  2/1: Patient noted to have severe hypomagnesemia as well as acute anemia.  She is receiving magnesium and PRBC transfusion respectively.  She is noted to have some hypoglycemia this a.m. that is asymptomatic.  We will plan to reduce insulin dosing for now.  Monitor with repeat labs in a.m.  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 to ensure further resolution of leukocytosis as well as CRP and ESR -Appreciate general surgery evaluation with no plans for debridement at this time -Discussed with ID who recommends vancomycin along with Rocephin 2 g daily for the next 6 weeks. PICC line has been placed 1/31  Type 1 diabetes -Currently with some hypoglycemia and therefore will reduce her current insulin requirements -She is otherwise asymptomatic  Chronic normocytic anemia-with acute decrease -Likely anemia of chronic disease -Anemia panel without significant findings -No overt bleeding identified -We will plan to get stool occult -1 unit PRBC today -Recheck H&H after transfusion along with CBC in a.m.  Hypomagnesemia -Repletion underway and will reevaluate in a.m.   DVT prophylaxis:Lovenox Code Status:Full Family Communication:Discussed with  mother 1/29 Disposition Plan:Continue now on vancomycin and Rocephin only.  PICC line has been placed 1/31.  Continue with PRBC transfusion today and check stool occult.  Recheck CBC in a.m.  Magnesium repletion.   Consultants:  General surgery  ID  Procedures:  None  Antimicrobials:  Anti-infectives (From admission, onward)   Start     Dose/Rate Route Frequency Ordered Stop   06/09/19 0845  cefTRIAXone (ROCEPHIN) 2 g in sodium chloride 0.9 % 100 mL IVPB     2 g 200 mL/hr over 30 Minutes Intravenous Every 24 hours 06/09/19 0835     06/09/19 0000  cefTRIAXone (ROCEPHIN) IVPB     2 g Intravenous Every 24 hours 06/09/19 1543     06/09/19 0000  vancomycin IVPB     750 mg Intravenous Every 12 hours 06/09/19 1543     06/07/19 1500  vancomycin (VANCOREADY) IVPB 750 mg/150 mL     750 mg 150 mL/hr over 60 Minutes Intravenous Every 12 hours 06/07/19 0151     06/07/19 1030  clindamycin (CLEOCIN) IVPB 600 mg  Status:  Discontinued     600 mg 100 mL/hr over 30 Minutes Intravenous Every 8 hours 06/07/19 0741 06/09/19 0835   06/07/19 0730  clindamycin (CLEOCIN) IVPB 600 mg  Status:  Discontinued     600 mg 100 mL/hr over 30 Minutes Intravenous Every 8 hours 06/07/19 0724 06/07/19 0741   06/07/19 0600  ceFEPIme (MAXIPIME) 2 g in sodium chloride 0.9 % 100 mL IVPB  Status:  Discontinued     2 g 200 mL/hr over 30 Minutes Intravenous Every 8 hours 06/07/19 0343 06/09/19 0835   06/07/19 0215  clindamycin (CLEOCIN) IVPB 600 mg     600 mg 100 mL/hr over 30 Minutes Intravenous  Once 06/07/19 0204 06/07/19 0254   06/07/19 0200  vancomycin (VANCOREADY) IVPB 500 mg/100 mL     500 mg 100 mL/hr over 60 Minutes Intravenous  Once 06/07/19 0025 06/07/19 0313   06/07/19 0000  cefTRIAXone (ROCEPHIN) 2 g in sodium chloride 0.9 % 100 mL IVPB     2 g 200 mL/hr over 30 Minutes Intravenous  Once 06/06/19 2333 06/07/19 0042   06/06/19 2345  vancomycin (VANCOCIN) IVPB 1000 mg/200 mL premix     1,000 mg  200 mL/hr over 60 Minutes Intravenous  Once 06/06/19 2333 06/07/19 0108       Subjective: Patient seen and evaluated today with no new acute complaints or concerns.  She denies any symptomatology.  She is noted to have severe anemia requiring PRBC transfusion as well as hypomagnesemia.  She has been having low blood glucose levels, but she is otherwise asymptomatic.  Objective: Vitals:   06/11/19 0549 06/11/19 0803 06/11/19 0845 06/11/19 0915  BP: 123/88  (!) 139/95 135/85  Pulse: 97  (!) 107 100  Resp: 20  20 18   Temp: 98.6 F (37 C)  99 F (37.2 C) 98.9 F (37.2 C)  TempSrc: Oral  Oral Oral  SpO2: 98% 96% 100% 100%  Weight: 55.3 kg     Height:        Intake/Output Summary (Last 24 hours) at 06/11/2019 1200 Last data filed at 06/11/2019 6073 Gross per 24 hour  Intake 2225.1 ml  Output 800 ml  Net 1425.1 ml   Filed Weights   06/08/19 0500 06/10/19 0600 06/11/19 0549  Weight: 54.1 kg 54.9 kg 55.3 kg    Examination:  General exam: Appears calm and comfortable, thin Respiratory system: Clear to auscultation. Respiratory effort normal. Cardiovascular system: S1 & S2 heard, RRR. No JVD, murmurs, rubs, gallops or clicks. No pedal edema. Gastrointestinal system: Abdomen is nondistended, soft and nontender. No organomegaly or masses felt. Normal bowel sounds heard. Central nervous system: Alert and oriented. No focal neurological deficits. Extremities: Symmetric 5 x 5 power. Skin: No rashes, lesions or ulcers Psychiatry: Judgement and insight appear normal. Mood & affect appropriate.     Data Reviewed: I have personally reviewed following labs and imaging studies  CBC: Recent Labs  Lab 06/07/19 0606 06/08/19 0831 06/09/19 0520 06/10/19 0609 06/11/19 0440  WBC 20.2* 22.0* 18.5* 13.0* 13.5*  HGB 9.4* 7.8* 7.7* 7.3* 6.3*  HCT 28.6* 23.8* 24.1* 23.0* 19.7*  MCV 81.5 81.5 82.8 81.9 83.1  PLT 288 308 341 303 710   Basic Metabolic Panel: Recent Labs  Lab 06/07/19  0606 06/08/19 0831 06/09/19 0520 06/10/19 0609 06/11/19 0440  NA 130* 132* 133* 133* 133*  K 3.9 3.9 3.5 3.4* 3.7  CL 95* 97* 98 99 101  CO2 25 24 26 24 25   GLUCOSE 231* 134* 110* 160* 78  BUN 17 19 20 15 11   CREATININE 0.80 0.76 0.85 0.62 0.57  CALCIUM 7.9* 7.8* 7.9* 7.7* 7.6*  MG  --   --   --   --  0.9*   GFR: Estimated Creatinine Clearance: 94.7 mL/min (by C-G formula based on SCr of 0.57 mg/dL). Liver Function Tests: Recent Labs  Lab 06/06/19 2343  AST 55*  ALT 36  ALKPHOS 433*  BILITOT 0.4  PROT 8.1  ALBUMIN 2.0*   No results for input(s): LIPASE, AMYLASE in the last 168 hours. No results for  input(s): AMMONIA in the last 168 hours. Coagulation Profile: No results for input(s): INR, PROTIME in the last 168 hours. Cardiac Enzymes: No results for input(s): CKTOTAL, CKMB, CKMBINDEX, TROPONINI in the last 168 hours. BNP (last 3 results) No results for input(s): PROBNP in the last 8760 hours. HbA1C: No results for input(s): HGBA1C in the last 72 hours. CBG: Recent Labs  Lab 06/10/19 1133 06/10/19 1603 06/10/19 2054 06/11/19 0757 06/11/19 1134  GLUCAP 108* 126* 148* 38* 42*   Lipid Profile: No results for input(s): CHOL, HDL, LDLCALC, TRIG, CHOLHDL, LDLDIRECT in the last 72 hours. Thyroid Function Tests: No results for input(s): TSH, T4TOTAL, FREET4, T3FREE, THYROIDAB in the last 72 hours. Anemia Panel: No results for input(s): VITAMINB12, FOLATE, FERRITIN, TIBC, IRON, RETICCTPCT in the last 72 hours. Sepsis Labs: Recent Labs  Lab 06/06/19 2345 06/07/19 0153  PROCALCITON  --  0.31  LATICACIDVEN 0.9 0.7    Recent Results (from the past 240 hour(s))  Respiratory Panel by RT PCR (Flu A&B, Covid) - Nasopharyngeal Swab     Status: None   Collection Time: 06/06/19 11:41 PM   Specimen: Nasopharyngeal Swab  Result Value Ref Range Status   SARS Coronavirus 2 by RT PCR NEGATIVE NEGATIVE Final    Comment: (NOTE) SARS-CoV-2 target nucleic acids are NOT  DETECTED. The SARS-CoV-2 RNA is generally detectable in upper respiratoy specimens during the acute phase of infection. The lowest concentration of SARS-CoV-2 viral copies this assay can detect is 131 copies/mL. A negative result does not preclude SARS-Cov-2 infection and should not be used as the sole basis for treatment or other patient management decisions. A negative result may occur with  improper specimen collection/handling, submission of specimen other than nasopharyngeal swab, presence of viral mutation(s) within the areas targeted by this assay, and inadequate number of viral copies (<131 copies/mL). A negative result must be combined with clinical observations, patient history, and epidemiological information. The expected result is Negative. Fact Sheet for Patients:  PinkCheek.be Fact Sheet for Healthcare Providers:  GravelBags.it This test is not yet ap proved or cleared by the [place] FDA and  has been authorized for detection and/or diagnosis of SARS-CoV-2 by FDA under an Emergency Use Authorization (EUA). This EUA will remain  in effect (meaning this test can be used) for the duration of the COVID-19 declaration under Section 564(b)(1) of the Act, 21 U.S.C. section 360bbb-3(b)(1), unless the authorization is terminated or revoked sooner.    Influenza A by PCR NEGATIVE NEGATIVE Final   Influenza B by PCR NEGATIVE NEGATIVE Final    Comment: (NOTE) The Xpert Xpress SARS-CoV-2/FLU/RSV assay is intended as an aid in  the diagnosis of influenza from Nasopharyngeal swab specimens and  should not be used as a sole basis for treatment. Nasal washings and  aspirates are unacceptable for Xpert Xpress SARS-CoV-2/FLU/RSV  testing. Fact Sheet for Patients: PinkCheek.be Fact Sheet for Healthcare Providers: GravelBags.it This test is not yet approved or cleared  by the [place] FDA and  has been authorized for detection and/or diagnosis of SARS-CoV-2 by  FDA under an Emergency Use Authorization (EUA). This EUA will remain  in effect (meaning this test can be used) for the duration of the  Covid-19 declaration under Section 564(b)(1) of the Act, 21  U.S.C. section 360bbb-3(b)(1), unless the authorization is  terminated or revoked. Performed at [facility], [address], [place], [place] [address]   Blood Culture (routine x 2)     Status: None (Preliminary result)  Collection Time: 06/06/19 11:45 PM   Specimen: BLOOD RIGHT FOREARM  Result Value Ref Range Status   Specimen Description   Final    BLOOD RIGHT FOREARM Performed at [facility], [address], [place], [place] [address]    Special Requests   Final    BOTTLES DRAWN AEROBIC AND ANAEROBIC Blood Culture adequate volume Performed at [facility], [address] [place], [place] [address]    Culture  Setup Time   Final    GRAM POSITIVE COCCI Gram Stain Report Called to,Read Back By and Verified With: KILMER,T @ 0148 ON 06/11/19 BY JUW ANAEROBIC BOTTLE ONLY GS DONE @ [facility] Performed at [facility], [address], [place], [place] [address]    Culture   Final    CULTURE REINCUBATED FOR BETTER GROWTH Performed at [facility], [place] [address] [place], [place] [address]    Report Status PENDING  Incomplete  Blood Culture (routine x 2)     Status: Abnormal   Collection Time: 06/06/19 11:46 PM   Specimen: BLOOD  Result Value Ref Range Status   Specimen Description   Final    BLOOD RIGHT ANTECUBITAL Performed at [facility], [address], [place], [place] [address]    Special Requests   Final    BOTTLES DRAWN AEROBIC AND ANAEROBIC Blood Culture adequate volume Performed at [facility], [address] [place], [place] [address]    Culture  Setup Time   Final    GRAM POSITIVE COCCI Gram Stain Report Called to,Read Back By and Verified With: LASHLEY,S AT 0730 ON  06/08/19 BY HUFFINES,S. ANAEROBIC BOTTLE ONLY Performed at [facility], [place] [address] [place], [place] [address]    Culture STREPTOCOCCUS GROUP F (A)  Final   Report Status 06/10/2019 FINAL  Final   Organism ID, Bacteria STREPTOCOCCUS GROUP F  Final      Susceptibility   Streptococcus group f - MIC*    PENICILLIN 0.25 INTERMEDIATE Intermediate     CEFTRIAXONE 1 SENSITIVE Sensitive     ERYTHROMYCIN <=0.12 SENSITIVE Sensitive     LEVOFLOXACIN <=0.25 SENSITIVE Sensitive     VANCOMYCIN 0.5 SENSITIVE Sensitive     * STREPTOCOCCUS GROUP F  Urine culture     Status: Abnormal   Collection Time: 06/07/19  2:12 AM   Specimen: Urine, Clean Catch  Result Value Ref Range Status   Specimen Description   Final    URINE, CLEAN CATCH Performed at [facility], [address], [place], [place] [address]    Special Requests   Final    NONE Performed at [facility], [address], [place], [place] [address]    Culture MULTIPLE SPECIES PRESENT, SUGGEST RECOLLECTION (A)  Final   Report Status 06/08/2019 FINAL  Final  MRSA PCR Screening     Status: None   Collection Time: 06/07/19 10:00 AM   Specimen: Nasal Mucosa; Nasopharyngeal  Result Value Ref Range Status   MRSA by PCR NEGATIVE NEGATIVE Final    Comment:        The GeneXpert MRSA Assay (FDA approved for NASAL specimens only), is one component of a comprehensive MRSA colonization surveillance program. It is not intended to diagnose MRSA infection nor to guide or monitor treatment for MRSA infections. Performed at [facility], [address] [place],  [address]          Radiology Studies: No results found.      Scheduled Meds: . sodium chloride   Intravenous  Once  . Chlorhexidine Gluconate Cloth  6 each Topical Daily  . insulin aspart  0-5 Units Subcutaneous QHS  . insulin aspart  0-9 Units Subcutaneous TID WC  . insulin aspart  3 Units Subcutaneous TID WC  . insulin glargine  15 Units Subcutaneous QHS   . metoprolol tartrate  25 mg Oral BID  . sodium chloride flush  3 mL Intravenous Q12H   Continuous Infusions: . sodium chloride    . sodium chloride 75 mL/hr at 06/10/19 2042  . cefTRIAXone (ROCEPHIN)  IV 2 g (06/11/19 0759)  . vancomycin 750 mg (06/11/19 0247)     LOS: 4 days    Time spent: 30 minutes    Darien Mignogna Darleen Crocker, DO [facility] Pager [phone]  If 7PM-7AM, please contact night-coverage [email or web address] Password [email or web address] 06/11/2019, 12:00 PM

## 2019-06-11 NOTE — Plan of Care (Signed)
  Problem: Acute Rehab PT Goals(only PT should resolve) Goal: Pt Will Go Supine/Side To Sit Outcome: Progressing Flowsheets (Taken 06/11/2019 1541) Pt will go Supine/Side to Sit: Independently Goal: Patient Will Transfer Sit To/From Stand Outcome: Progressing Flowsheets (Taken 06/11/2019 1541) Patient will transfer sit to/from stand: with modified independence Goal: Pt Will Transfer Bed To Chair/Chair To Bed Outcome: Progressing Flowsheets (Taken 06/11/2019 1541) Pt will Transfer Bed to Chair/Chair to Bed: with modified independence Goal: Pt Will Ambulate Outcome: Progressing Flowsheets (Taken 06/11/2019 1541) Pt will Ambulate:  > 125 feet  with modified independence   3:42 PM, 06/11/19 Lonell Grandchild, MPT Physical Therapist with [facility] 336 [phone] office [phone] mobile phone

## 2019-06-11 NOTE — Progress Notes (Signed)
CRITICAL VALUE ALERT  Critical Value:  HBG: 6.3  Date & Time Notied:  06/11/2019  Provider Notified: Truddie Coco  Orders Received/Actions taken: None at this time

## 2019-06-11 NOTE — Progress Notes (Signed)
Pharmacy Antibiotic Note  Lindsey Murillo is a 24 y.o. diabetic  female admitted on 06/06/2019 with   cellulitis and osteomyelitis. Pharmacy has been consulted for vancomycin and cefepime dosing.  Plan: Vanco trough 20 Cefepime 2g IV q8h Continue Vanco 750mg  IV  q12h- renal function improving Goal vancomycin trough range:  15-20 mcg/mL Pharmacy will continue to monitor renal function, vancomycin troughs as clinically appropriate,  cultures and patient progress.  Height: 5\' 9"  (175.3 cm) Weight: 122 lb (55.3 kg) IBW/kg (Calculated) : 66.2  Temp (24hrs), Avg:98.7 F (37.1 C), Min:98.3 F (36.8 C), Max:99 F (37.2 C)  Recent Labs  Lab 06/06/19 2343 06/06/19 2345 06/07/19 0153 06/07/19 0606 06/07/19 0606 06/08/19 0831 06/09/19 0520 06/10/19 0609 06/11/19 0440 06/11/19 1458  WBC   < >  --   --  20.2*   < > 22.0* 18.5* 13.0* 13.5* 14.1*  CREATININE   < >  --   --  0.80  --  0.76 0.85 0.62 0.57  --   LATICACIDVEN  --  0.9 0.7  --   --   --   --   --   --   --   VANCOTROUGH  --   --   --   --   --   --   --   --   --  20   < > = values in this interval not displayed.    Estimated Creatinine Clearance: 94.7 mL/min (by C-G formula based on SCr of 0.57 mg/dL).    No Known Allergies  Antimicrobials this admission: ceftriaxone 1/30>>  vancomycin 1/27>>  cefepime 1/28>>1/30 Clinda 1/28 >>1/30   Microbiology results: 1/27 [facility] x2:  strep group F 1/27 UCx:   multiple species 1/27 Resp PCR: SARS CoV-2 negative;  MRSA PCR: neg    Thank you for allowing pharmacy to  Participate in this patient's care.  Ramond Craver 06/11/2019 3:39 PM

## 2019-06-12 ENCOUNTER — Encounter (HOSPITAL_BASED_OUTPATIENT_CLINIC_OR_DEPARTMENT_OTHER): Payer: 59 | Admitting: Internal Medicine

## 2019-06-12 ENCOUNTER — Other Ambulatory Visit: Payer: Self-pay | Admitting: Family Medicine

## 2019-06-12 LAB — GLUCOSE, CAPILLARY
Glucose-Capillary: 103 mg/dL — ABNORMAL HIGH (ref 70–99)
Glucose-Capillary: 35 mg/dL — CL (ref 70–99)
Glucose-Capillary: 39 mg/dL — CL (ref 70–99)
Glucose-Capillary: 53 mg/dL — ABNORMAL LOW (ref 70–99)
Glucose-Capillary: 62 mg/dL — ABNORMAL LOW (ref 70–99)
Glucose-Capillary: 77 mg/dL (ref 70–99)
Glucose-Capillary: 79 mg/dL (ref 70–99)

## 2019-06-12 LAB — TYPE AND SCREEN
ABO/RH(D): A POS
Antibody Screen: NEGATIVE
Unit division: 0

## 2019-06-12 LAB — CBC
HCT: 26.2 % — ABNORMAL LOW (ref 36.0–46.0)
Hemoglobin: 8.5 g/dL — ABNORMAL LOW (ref 12.0–15.0)
MCH: 27.1 pg (ref 26.0–34.0)
MCHC: 32.4 g/dL (ref 30.0–36.0)
MCV: 83.4 fL (ref 80.0–100.0)
Platelets: 331 10*3/uL (ref 150–400)
RBC: 3.14 MIL/uL — ABNORMAL LOW (ref 3.87–5.11)
RDW: 14.6 % (ref 11.5–15.5)
WBC: 15.9 10*3/uL — ABNORMAL HIGH (ref 4.0–10.5)
nRBC: 0 % (ref 0.0–0.2)

## 2019-06-12 LAB — BASIC METABOLIC PANEL
Anion gap: 8 (ref 5–15)
BUN: 11 mg/dL (ref 6–20)
CO2: 23 mmol/L (ref 22–32)
Calcium: 8 mg/dL — ABNORMAL LOW (ref 8.9–10.3)
Chloride: 101 mmol/L (ref 98–111)
Creatinine, Ser: 0.52 mg/dL (ref 0.44–1.00)
GFR calc Af Amer: >60 mL/min (ref >60)
GFR calc non Af Amer: >60 mL/min (ref >60)
Glucose, Bld: 54 mg/dL — ABNORMAL LOW (ref 70–99)
Potassium: 3.7 mmol/L (ref 3.5–5.1)
Sodium: 132 mmol/L — ABNORMAL LOW (ref 135–145)

## 2019-06-12 LAB — BPAM RBC
Blood Product Expiration Date: 202102182359
ISSUE DATE / TIME: 202102010852
Unit Type and Rh: 5100

## 2019-06-12 LAB — MAGNESIUM: Magnesium: 1.6 mg/dL — ABNORMAL LOW (ref 1.7–2.4)

## 2019-06-12 MED ORDER — INSULIN GLARGINE 100 UNIT/ML ~~LOC~~ SOLN: 15.0000 [IU] | Freq: Every day | SUBCUTANEOUS | 11 refills | Status: DC

## 2019-06-12 MED ORDER — HEPARIN SOD (PORK) LOCK FLUSH 100 UNIT/ML IV SOLN
500.0000 [IU] | Freq: Once | INTRAVENOUS | Status: AC
  Administered 2019-06-12: 14:00:00 500 [IU] via INTRAVENOUS
  Filled 2019-06-12: qty 5

## 2019-06-12 MED ORDER — METOPROLOL TARTRATE 25 MG PO TABS: 25.0000 mg | ORAL_TABLET | Freq: Two times a day (BID) | ORAL | 0 refills | Status: DC

## 2019-06-12 MED ORDER — BISACODYL 10 MG RE SUPP: 10.0000 mg | Freq: Every day | RECTAL | 0 refills | Status: DC | PRN

## 2019-06-12 MED ORDER — HEPARIN SOD (PORK) LOCK FLUSH 10 UNIT/ML IV SOLN
10.0000 [IU] | Freq: Once | INTRAVENOUS | Status: DC
  Filled 2019-06-12: qty 1

## 2019-06-12 MED ORDER — MAGNESIUM OXIDE 400 MG PO TABS: 400.0000 mg | ORAL_TABLET | Freq: Every day | ORAL | 0 refills | Status: AC

## 2019-06-12 MED ORDER — TRAMADOL HCL 50 MG PO TABS: 50.0000 mg | ORAL_TABLET | Freq: Three times a day (TID) | ORAL | 0 refills | Status: DC | PRN

## 2019-06-12 MED ORDER — MAGNESIUM SULFATE 2 GM/50ML IV SOLN
2.0000 g | Freq: Once | INTRAVENOUS | Status: AC
  Administered 2019-06-12: 11:00:00 2 g via INTRAVENOUS
  Filled 2019-06-12: qty 50

## 2019-06-12 MED ORDER — POLYETHYLENE GLYCOL 3350 17 G PO PACK: 17.0000 g | PACK | Freq: Every day | ORAL | 0 refills | Status: DC | PRN

## 2019-06-12 NOTE — TOC Transition Note (Signed)
Transition of Care [facility]) - CM/SW Discharge Note   Patient Details  Name: Lindsey Murillo MRN: [id number] Date of Birth: [birth year]  Transition of Care [facility] CM/SW Contact:  Shade Flood, LCSW Phone Number: 06/12/2019, 12:35 PM   Clinical Narrative:     Pt stable for dc today per MD. Plan remains for dc home with [facility] and IV antibiotic therapy through [place] with Mickel Baas at [facility] to confirm that start of care will be at 6:00 tonight. Both have confirmed. Information added to pt's AVS and pt's RN updated.   There are no other TOC needs for dc.  Final next level of care: [place] Barriers to Discharge: Barriers Resolved   Patient Goals and CMS Choice        Discharge Placement                       Discharge Plan and Services   Discharge Planning Services: CM Consult Post Acute Care Choice: Home Health                    HH Arranged: RN, IV Antibiotics HH Agency: [place] ([facility]), Other - See comment Date [place]: 06/12/19 Time [place]: [id number] Representative spoke with at [place]: [place] Determinants of Health ([place]) Interventions     Readmission Risk Interventions No flowsheet data found.

## 2019-06-12 NOTE — Progress Notes (Signed)
Discharge instructions given to patient and mother.  Both verbalized understanding of new perscriptions at [facility] and of home health delivering iv antibotic. Picc line to rt upper arm flushed with heparin per protocol. New dressing applied to site. New foam dressing applier to rt lower leg. Pt left unit via w/c. No acute distress noted.

## 2019-06-12 NOTE — Progress Notes (Signed)
Inpatient Diabetes Program Recommendations  AACE/ADA: New Consensus Statement on Inpatient Glycemic Control (2015)  Target Ranges:  Prepandial:   less than 140 mg/dL      Peak postprandial:   less than 180 mg/dL (1-2 hours)      Critically ill patients:  140 - 180 mg/dL   Lab Results  Component Value Date   GLUCAP 79 06/12/2019   HGBA1C 9.3 (H) 05/09/2019    Review of Glycemic Control  Continues with hypoglycemia. Pt has received no insulin since 1/31 at 2200, getting Lantus 30 units. Had hypoglycemia yesterday and today.  C-peptide ordered and pt to f/u with Dr. Dorris Fetch. To be d/ced today.  Thank you. Lorenda Peck, RD, LDN, CDE Inpatient Diabetes Coordinator [phone]

## 2019-06-12 NOTE — Discharge Summary (Signed)
Physician Discharge Summary  Lindsey Murillo WNI:[id number] DOB: May 16, 1995 DOA: 06/06/2019  PCP: Mikey Kirschner, MD  Admit date: 06/06/2019  Discharge date: 06/12/2019  Admitted From:Home  Disposition:  Home  Recommendations for Outpatient Follow-up:  1. Follow up with PCP in 1-2 weeks 2. Please obtain BMP/CBC in one week to ensure stability of hemoglobin levels.  Also check magnesium level 3. Continue on magnesium supplementation as prescribed for severe hypomagnesemia 4. Continue on half dose of Basaglar at 15 units at bedtime instead of 30 units due to episodes of hypoglycemia.  Emphasized need to snack frequently.  Will need to monitor this carefully at home and follow-up with endocrinology Dr. Dorris Fetch in the next 2 weeks.  There is history of medication noncompliance as well. 5. Continue on metoprolol 25 mg twice daily as prescribed for tachycardia as well as hypertension and monitor outpatient 6. Tramadol given for pain control 15 tablets with 0 refills 7. Continue home IV infusions with vancomycin and Rocephin through 07/18/2019 for treatment of sacral osteomyelitis  Home Health: Yes with RN to assist with home infusion therapy  Equipment/Devices: Patient will leave with PICC line  Discharge Condition: Stable  CODE STATUS: Full  Diet recommendation: Heart Healthy/carb modified  Brief/Interim Summary: Per HPI: Lindsey R Murchisonis a 24 y.o.femalewith medical history significant oftype 1 diabetes who presented to the ER with increased back pain.Patient states she had increased pain over the lower back and sacral region over the last 2 days. She is had difficulty in the sitting position due to increased pain. Today she started having a fever with chills. Currently not on antibiotics. Has a history of diabetes mellitus type 1 which is uncontrolled. No prior history of spinal abscesses, osteomyelitis. ED Course: Vital Signs reviewed on presentation, significant  fortemperature 101.1, heart rate 125, blood pressure 143/104, saturation 99% room air. Labs reviewed, significant forsodium 130, potassium 4.1, chloride 95, glucose 234, BUN 19, creatinine 0.9, albumin 2.0, LFTs within normal meds, lactic acid 0.9, WBC count 20.9, hemoglobin 8.5, hematocrit 26, platelets 342, pregnancy test is negative. Flu PCR negative, SARS Covid RT-PCR negative, urinalysis negative, blood and urine cultures have been sent. Imaging personally Reviewed,CT of the abdomen pelvis with contrast shows diffuse anasarca seen throughout the external soft tissue with focal soft tissue swelling and inflammatory stranding involving the subcutaneous fat posterior to the sacrum concerning for infection/cellulitis. Overlying skin thickening. Inflammatory changes are fairly ill-defined at this time, most consistent with phlegmon. No definite discrete abscess or drainable fluid collection. Small focus of probable gas within the region which could reflect infection with gas-forming organism or possible communication with overlying skin. Evidence for osseous erosion of the underlying sacrum at approximately S3-S4 segment highly suggestive of osteomyelitis. EKG personally reviewed, showssinus tachycardia, no acute ST-T changes.  1/28:Patient seen and evaluated at bedside this morning. She has just had MRI demonstrating some osteomyelitis. General surgery consultation pending. Continue on current IV antibiotics monitor blood cultures with no growth noted thus far. Obtain wound culture. She is noted to have some blurry vision and will try eyedrops for now. No other neurological deficits identified. We will plan to check cultures and see if any surgical cultures could be obtained as well. Plan to discuss with ID plans for IV antibiotic treatment in the next 48 hours. Plan to keep at this facility with no need for transfer noted.  1/29:Patient seen by general surgery with plan to continue  current IV antibiotics. She appears to be doing somewhat better overnight and  is okay for transfer to telemetry today. Blood cultures with gram-positive cocci in 1 set which is likely contaminant. Fluid collection could not be seen or drained in the sacral region at this time. Monitor blood cultures for ID and sensitivity to ensure that this is contaminant and monitor urine repeat CBC. We will plan to discuss with ID in the next 1-2 days.  1/30:Patient overall doing well this morning with no fever overnight. She states that the pain in her low back has improved and leukocytosis is improving as well. We will recheck ESR and CRP. Blood culture appears to be contaminant. Will order placement of PICC line and plan for outpatient IV antibiotic treatment for 6 weeks with Rocephin and vancomycin after discussion with ID. She will also need weekly ESR and CRP. Plan for PT evaluation today as well. Monitor for another 24 hours and try to assess for discharge in a.m.  1/31:Patient doing well this morning with PICC line still pending. Insurance company states that home health will not be set up for infusions until 2/1. We will continue to monitor labs. Replete potassium and recheck in a.m. ESR and CRP downtrending. PT evaluation pending prior to discharge.  2/1: Patient noted to have severe hypomagnesemia as well as acute anemia.  She is receiving magnesium and PRBC transfusion respectively.  She is noted to have some hypoglycemia this a.m. that is asymptomatic.  We will plan to reduce insulin dosing for now.  Monitor with repeat labs in a.m.  2/2: Hypomagnesemia has improved to 1.6.  She will be given repeat dose of IV magnesium sulfate 2 g today and be discharged on magnesium oxide daily.  She will need careful follow-up for repeat labs to include CBC to ensure hemoglobin levels are stable.  She has had no overt bleeding or hematochezia or dark stools or abdominal pain while here.  There is no  reason to suspect acute GI bleed.  She would, however benefit from outpatient endoscopy in the near future.  She has done well throughout the course of this day and will be set up with home IV infusions of Rocephin and vancomycin for total 6 weeks to ensure complete treatment of her sacral osteomyelitis.  She is also to follow-up closely with her endocrinologist given labile blood glucose control.  No other acute events noted throughout the course of this day.  She has been assessed by PT with no home health needs noted and is stable for discharge.  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  This is likely secondary to hematogenous spread from right lower extremity chronic osteomyelitis with draining sinus.  Discharge Instructions  Discharge Instructions    Diet - low sodium heart healthy   Complete by: As directed    Home infusion instructions   Complete by: As directed    Instructions: Flushing of vascular access device: 0.9% NaCl pre/post medication administration and prn patency; Heparin 100 u/ml, 5m for implanted ports and Heparin 10u/ml, 523mfor all other central venous catheters.   Increase activity slowly   Complete by: As directed      Allergies as of  06/12/2019   No Known Allergies     Medication List    STOP taking these medications   amoxicillin-clavulanate 875-125 MG tablet Commonly known as: AUGMENTIN   Basaglar KwikPen 100 UNIT/ML Sopn Replaced by: insulin glargine 100 UNIT/ML injection   ciprofloxacin 500 MG tablet Commonly known as: Cipro    doxycycline 100 MG capsule Commonly known as: VIBRAMYCIN     TAKE these medications   Accu-Chek Aviva device Use as instructed   bisacodyl 10 MG suppository Commonly known as: DULCOLAX Place 1 suppository (10 mg total) rectally daily as needed for moderate constipation.   cefTRIAXone  IVPB Commonly known as: ROCEPHIN Inject 2 g into the vein daily. Indication:  Sacral osteomyelitis Last Day of Therapy:  07/18/19 Labs - Once weekly:  CBC/D and BMP, Labs - Every other week:  ESR and CRP   FreeStyle Libre 14 Day Sensor Misc Inject 1 each into the skin every 14 (fourteen) days. Use as directed.   glucagon 1 MG injection Follow package directions for low blood sugar. What changed:   when to take this  reasons to take this   ibuprofen 200 MG tablet Commonly known as: ADVIL Take 600 mg by mouth daily as needed.   insulin glargine 100 UNIT/ML injection Commonly known as: LANTUS Inject 0.15 mLs (15 Units total) into the skin at bedtime. Replaces: Basaglar KwikPen 100 UNIT/ML Sopn   magnesium oxide 400 MG tablet Commonly known as: MAG-OX Take 1 tablet (400 mg total) by mouth daily.   metoprolol tartrate 25 MG tablet Commonly known as: LOPRESSOR Take 1 tablet (25 mg total) by mouth 2 (two) times daily.   NovoLOG FlexPen 100 UNIT/ML FlexPen Generic drug: insulin aspart Inject 8-11 Units into the skin 3 (three) times daily with meals.   polyethylene glycol 17 g packet Commonly known as: MIRALAX / GLYCOLAX Take 17 g by mouth daily as needed for mild constipation.   traMADol 50 MG tablet Commonly known as: ULTRAM Take 1 tablet (50 mg total) by mouth every 8 (eight) hours as needed for moderate pain or severe pain.   Tylenol 8 Hour Arthritis Pain 650 MG CR tablet Generic drug: acetaminophen Take 650 mg by mouth every 8 (eight) hours as needed for pain.   vancomycin  IVPB Inject 750 mg into the vein every 12 (twelve) hours. Indication:  Sacral osteomyelitis Last Day  of Therapy:  07/18/19 Labs - Sunday/Monday:  CBC/D, BMP, and vancomycin trough. Labs - Thursday:  BMP and vancomycin trough Labs - Every other week:  ESR and CRP   Vitamin D (Ergocalciferol) 1.25 MG (50000 UNIT) Caps capsule Commonly known as: DRISDOL Take 1 capsule (50,000 Units total) by mouth every 7 (seven) days.            Home Infusion Instuctions  (From admission, onward)         Start     Ordered   06/09/19 0000  Home infusion instructions    Question:  Instructions  Answer:  Flushing of vascular access device: 0.9% NaCl pre/post medication administration and prn patency; Heparin 100 u/ml, 46m for implanted ports and Heparin 10u/ml, 542mfor all other central venous catheters.   06/09/19 1543         Follow-up Information    LuMikey KirschnerMD Follow up in 1 week(s).   Specialty: Family Medicine Contact information: [id number] [phone]        NiCassandria AngerMD Follow up in 2  week(s).   Specialty: Endocrinology Contact information: [place] [address] [phone]          No Known Allergies  Consultations:  General surgery  ID   Procedures/Studies: CT ABDOMEN PELVIS W CONTRAST  Result Date: 06/07/2019 CLINICAL DATA:  Initial evaluation for acute abdominal abscess/infection. EXAM: CT ABDOMEN AND PELVIS WITH CONTRAST TECHNIQUE: Multidetector CT imaging of the abdomen and pelvis was performed using the standard protocol following bolus administration of intravenous contrast. CONTRAST:  156m OMNIPAQUE IOHEXOL 300 MG/ML  SOLN COMPARISON:  None. FINDINGS: Lower chest: Visualized lung bases are clear. Hepatobiliary: Liver demonstrates a normal contrast enhanced appearance. Mild diffuse periportal edema. Gallbladder contracted without abnormality. No biliary dilatation. Pancreas: Few scattered calcifications noted within the pancreas, stable. 2.2 x 4.6 cm cyst positioned just superior to  the pancreatic tail again seen, unchanged. No acute peripancreatic inflammation. Spleen: Spleen within normal limits. Adrenals/Urinary Tract: Adrenal glands are normal. Kidneys equal in size with symmetric enhancement. No nephrolithiasis, hydronephrosis or focal enhancing renal mass. No visible hydroureter. Bladder moderately distended without abnormality. Stomach/Bowel: Stomach within normal limits. No evidence for bowel obstruction. No definite acute inflammatory changes seen about the bowels. Appendix not well visualized. Vascular/Lymphatic: Normal intravascular enhancement seen throughout the intra-abdominal aorta. Mesenteric vessels patent proximally. No pathologically enlarged intra-abdominal or pelvic lymph nodes. Mildly enlarged bilateral inguinal lymph nodes measure up to 18 mm on the right and 14 mm on the left, indeterminate, but could be reactive. Reproductive: Uterus and ovaries within normal limits for age. Other: No free intraperitoneal air. Small volume free fluid present within the right lower quadrant and pelvis, nonspecific, but could be reactive and/or physiologic. Mild diffuse mesenteric edema noted. Musculoskeletal: Diffuse anasarca seen throughout the external soft tissues. There is focal soft tissue swelling and inflammatory stranding involving the subcutaneous fat posterior to the sacrum, concerning for infection/cellulitis. Overlying skin thickening. Inflammatory changes are fairly ill-defined at this time, most consistent with phlegmon. No definite discrete abscess or drainable fluid collection. There is a small focus of probable gas within this region, which could reflect infection with gas-forming organism or possibly communication with the overlying skin (series 2, image 59). No other dissecting soft tissue emphysema seen elsewhere. Additionally, evidence for osseous erosion at the underlying sacrum at approximately the S3/S4 segment, highly suggestive of associated osteomyelitis.  IMPRESSION: 1. Soft tissue swelling with inflammatory stranding involving the subcutaneous fat posterior to the sacrum, concerning for infection/cellulitis. Small focus of gas within this region could reflect changes related to an aggressive soft tissue infection with gas-forming organism or possibly communication with the overlying skin (series 2, image 59). Correlation with physical exam for possible ulceration and/or sinus tract recommended. 2. Associated osseous erosion at the underlying sacrum at approximately the S3/S4 segment, highly suggestive of associated osteomyelitis. 3. Small volume free fluid within the right lower quadrant, nonspecific, but could be reactive and/or physiologic. 4. Mildly enlarged bilateral inguinal lymph nodes, nonspecific, but may be reactive. 5. Atrophic pancreas with scattered calcifications, consistent with chronic pancreatitis. 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 Electronically Signed   By: BJeannine BogaM.D.   On: 06/07/2019 01:59   MR SACRUM SI JOINTS W WO CONTRAST  Result Date: 06/07/2019 CLINICAL DATA:  Low back/buttock/sacral pain with fever and abnormal CT scan. Elevated white blood cell count. EXAM: MRI PELVIS WITHOUT AND WITH CONTRAST TECHNIQUE: Multiplanar multisequence MR imaging of the pelvis was performed both before and after administration of  intravenous contrast. CONTRAST:  26m GADAVIST GADOBUTROL 1 MMOL/ML IV SOLN COMPARISON:  CT scan, same date. FINDINGS: There appears to be an open wound involving the right lower sacral area with fluid contacting the skin. The right gluteus maximus muscle is markedly thickened and demonstrates diffuse myositis. Small fluid collections are noted in the muscle 1 of which demonstrates rim enhancement after contrast consistent with pyomyositis. Do not see a well-formed discrete drainable soft tissue abscess but there is significant focal cellulitis involving the  subcutaneous tissues in the right buttock area overlying the gluteus maximus. Abnormal T2 signal intensity and enhancement in the lower sacral segments (3, 4 and 5) is highly suspicious for osteomyelitis. The coccyx segments appear intact. Both hips are normally located. No findings for septic arthritis, osteomyelitis or AVN. The pubic symphysis and SI joints are intact. No evidence of septic arthritis. Mild inflammation/edema in the right adductor muscles suggesting myositis without evidence of pyomyositis. No significant intrapelvic abnormalities are identified. The bladder is grossly normal and the uterus and ovaries are unremarkable. There is a small amount of free pelvic fluid and moderate presacral edema. No rim enhancing intrapelvic abscess is identified. IMPRESSION: 1. Suspect small open wound at the very top of the gluteal fold on the right with underlying focal cellulitis, myofasciitis involving the right gluteus maximus muscle and a small focus of pyomyositis. No drainable abscess is identified. 2. Findings suspicious for sacral osteomyelitis. 3. No findings for septic arthritis and no intrapelvic abscess. Electronically Signed   By: PMarijo SanesM.D.   On: 06/07/2019 09:20   [place]KG SITE RITE  Result Date: 06/09/2019 If Site Rite image not attached, placement could not be confirmed due to current cardiac rhythm.    Discharge Exam: Vitals:   06/11/19 2158 06/12/19 0537  BP: (!) 146/102 (!) 141/100  Pulse: (!) 109 (!) 105  Resp: 18 18  Temp: 99.6 F (37.6 C) 99.9 F (37.7 C)  SpO2: 100% 98%   Vitals:   06/11/19 0915 06/11/19 1215 06/11/19 2158 06/12/19 0537  BP: 135/85 130/78 (!) 146/102 (!) 141/100  Pulse: 100 92 (!) 109 (!) 105  Resp: 18 17 18 18   Temp: 98.9 F (37.2 C) 98.8 F (37.1 C) 99.6 F (37.6 C) 99.9 F (37.7 C)  TempSrc: Oral Oral Oral Oral  SpO2: 100% 100% 100% 98%  Weight:      Height:        General: Pt is alert, awake, not in acute distress,  thin Cardiovascular: RRR, S1/S2 +, no rubs, no gallops Respiratory: CTA bilaterally, no wheezing, no rhonchi Abdominal: Soft, NT, ND, bowel sounds + Extremities: no edema, no cyanosis    The results of significant diagnostics from this hospitalization (including imaging, microbiology, ancillary and laboratory) are listed below for reference.     Microbiology: Recent Results (from the past 240 hour(s))  Respiratory Panel by RT PCR (Flu A&B, Covid) - Nasopharyngeal Swab     Status: None   Collection Time: 06/06/19 11:41 PM   Specimen: Nasopharyngeal Swab  Result Value Ref Range Status   SARS Coronavirus 2 by RT PCR NEGATIVE NEGATIVE Final    Comment: (NOTE) SARS-CoV-2 target nucleic acids are NOT DETECTED. The SARS-CoV-2 RNA is generally detectable in upper respiratoy specimens during the acute phase of infection. The lowest concentration of SARS-CoV-2 viral copies this assay can detect is 131 copies/mL. A negative result does not preclude SARS-Cov-2 infection and should not be used as the sole basis for treatment or other patient management  decisions. A negative result may occur with  improper specimen collection/handling, submission of specimen other than nasopharyngeal swab, presence of viral mutation(s) within the areas targeted by this assay, and inadequate number of viral copies (<131 copies/mL). A negative result must be combined with clinical observations, patient history, and epidemiological information. The expected result is Negative. Fact Sheet for Patients:  PinkCheek.be Fact Sheet for Healthcare Providers:  GravelBags.it This test is not yet ap proved or cleared by the [place] FDA and  has been authorized for detection and/or diagnosis of SARS-CoV-2 by FDA under an Emergency Use Authorization (EUA). This EUA will remain  in effect (meaning this test can be used) for the duration of the COVID-19  declaration under Section 564(b)(1) of the Act, 21 U.S.C. section 360bbb-3(b)(1), unless the authorization is terminated or revoked sooner.    Influenza A by PCR NEGATIVE NEGATIVE Final   Influenza B by PCR NEGATIVE NEGATIVE Final    Comment: (NOTE) The Xpert Xpress SARS-CoV-2/FLU/RSV assay is intended as an aid in  the diagnosis of influenza from Nasopharyngeal swab specimens and  should not be used as a sole basis for treatment. Nasal washings and  aspirates are unacceptable for Xpert Xpress SARS-CoV-2/FLU/RSV  testing. Fact Sheet for Patients: PinkCheek.be Fact Sheet for Healthcare Providers: GravelBags.it This test is not yet approved or cleared by the [place] FDA and  has been authorized for detection and/or diagnosis of SARS-CoV-2 by  FDA under an Emergency Use Authorization (EUA). This EUA will remain  in effect (meaning this test can be used) for the duration of the  Covid-19 declaration under Section 564(b)(1) of the Act, 21  U.S.C. section 360bbb-3(b)(1), unless the authorization is  terminated or revoked. Performed at [facility], [address], [place], [place] [address]   Blood Culture (routine x 2)     Status: None (Preliminary result)   Collection Time: 06/06/19 11:45 PM   Specimen: BLOOD RIGHT FOREARM  Result Value Ref Range Status   Specimen Description   Final    BLOOD RIGHT FOREARM Performed at [facility], [address], [place], [place] [address]    Special Requests   Final    BOTTLES DRAWN AEROBIC AND ANAEROBIC Blood Culture adequate volume Performed at [facility], [address], [place], [place] [address]    Culture  Setup Time   Final    GRAM POSITIVE COCCI Gram Stain Report Called to,Read Back By and Verified With: KILMER,T @ 0148 ON 06/11/19 BY JUW ANAEROBIC BOTTLE ONLY GS DONE @ [facility] Performed at [facility], [address] [place], [place] [address]    Culture   Final    CULTURE  REINCUBATED FOR BETTER GROWTH Performed at [facility], [place] [address] [place], [place] [address]    Report Status PENDING  Incomplete  Blood Culture (routine x 2)     Status: Abnormal   Collection Time: 06/06/19 11:46 PM   Specimen: BLOOD  Result Value Ref Range Status   Specimen Description   Final    BLOOD RIGHT ANTECUBITAL Performed at [facility], [address] [place], [place] [address]    Special Requests   Final    BOTTLES DRAWN AEROBIC AND ANAEROBIC Blood Culture adequate volume Performed at [facility], [address] [place], [place] [address]    Culture  Setup Time   Final    GRAM POSITIVE COCCI Gram Stain Report Called to,Read Back By and Verified With: LASHLEY,S AT 0730 ON 06/08/19 BY HUFFINES,S. ANAEROBIC  BOTTLE ONLY Performed at [facility], [place] [address] [place], [place] [address]    Culture STREPTOCOCCUS GROUP F (A)  Final   Report Status 06/10/2019 FINAL  Final   Organism ID, Bacteria STREPTOCOCCUS GROUP F  Final      Susceptibility   Streptococcus group f - MIC*    PENICILLIN 0.25 INTERMEDIATE Intermediate     CEFTRIAXONE 1 SENSITIVE Sensitive     ERYTHROMYCIN <=0.12 SENSITIVE Sensitive     LEVOFLOXACIN <=0.25 SENSITIVE Sensitive     VANCOMYCIN 0.5 SENSITIVE Sensitive     * STREPTOCOCCUS GROUP F  Urine culture     Status: Abnormal   Collection Time: 06/07/19  2:12 AM   Specimen: Urine, Clean Catch  Result Value Ref Range Status   Specimen Description   Final    URINE, CLEAN CATCH Performed at [facility], [address] [place], [place] [address]    Special Requests   Final    NONE Performed at [facility], [address] [place], [place] [address]    Culture MULTIPLE SPECIES PRESENT, SUGGEST RECOLLECTION (A)  Final   Report Status 06/08/2019 FINAL  Final  MRSA PCR Screening     Status: None   Collection Time: 06/07/19 10:00 AM   Specimen: Nasal Mucosa; Nasopharyngeal  Result Value Ref Range Status   MRSA by PCR NEGATIVE  NEGATIVE Final    Comment:        The GeneXpert MRSA Assay (FDA approved for NASAL specimens only), is one component of a comprehensive MRSA colonization surveillance program. It is not intended to diagnose MRSA infection nor to guide or monitor treatment for MRSA infections. Performed at [facility], [address] [place], [place] [address]      Labs: BNP (last 3 results) No results for input(s): BNP in the last 8760 hours. Basic Metabolic Panel: Recent Labs  Lab 06/08/19 0831 06/09/19 0520 06/10/19 0609 06/11/19 0440 06/12/19 0916  NA 132* 133* 133* 133* 132*  K 3.9 3.5 3.4* 3.7 3.7  CL 97* 98 99 101 101  CO2 24 26 24 25 23   GLUCOSE 134* 110* 160* 78 54*  BUN 19 20 15 11 11   CREATININE 0.76 0.85 0.62 0.57 0.52  CALCIUM 7.8* 7.9* 7.7* 7.6* 8.0*  MG  --   --   --  0.9* 1.6*   Liver Function Tests: Recent Labs  Lab 06/06/19 2343  AST 55*  ALT 36  ALKPHOS 433*  BILITOT 0.4  PROT 8.1  ALBUMIN 2.0*   No results for input(s): LIPASE, AMYLASE in the last 168 hours. No results for input(s): AMMONIA in the last 168 hours. CBC: Recent Labs  Lab 06/09/19 0520 06/10/19 0609 06/11/19 0440 06/11/19 1458 06/12/19 0916  WBC 18.5* 13.0* 13.5* 14.1* 15.9*  NEUTROABS  --   --   --  10.9*  --   HGB 7.7* 7.3* 6.3* 8.9* 8.5*  HCT 24.1* 23.0* 19.7* 27.8* 26.2*  MCV 82.8 81.9 83.1 84.0 83.4  PLT 341 303 277 300 331   Cardiac Enzymes: No results for input(s): CKTOTAL, CKMB, CKMBINDEX, TROPONINI in the last 168 hours. BNP: Invalid input(s): POCBNP CBG: Recent Labs  Lab 06/12/19 0004 06/12/19 0721 06/12/19 0743 06/12/19 0915 06/12/19 1028  GLUCAP 103* 35* 62* 53* 79   D-Dimer No results for input(s): DDIMER in the last 72 hours. Hgb A1c No results for input(s): HGBA1C in the last 72 hours. Lipid Profile No results for input(s): CHOL, HDL, LDLCALC, TRIG, CHOLHDL, LDLDIRECT in  the last 72 hours. Thyroid function studies No results for input(s): TSH, T4TOTAL,  T3FREE, THYROIDAB in the last 72 hours.  Invalid input(s): FREET3 Anemia work up No results for input(s): VITAMINB12, FOLATE, FERRITIN, TIBC, IRON, RETICCTPCT in the last 72 hours. Urinalysis    Component Value Date/Time   COLORURINE YELLOW 06/07/2019 0212   APPEARANCEUR HAZY (A) 06/07/2019 0212   LABSPEC 1.016 06/07/2019 0212   PHURINE 5.0 06/07/2019 0212   GLUCOSEU NEGATIVE 06/07/2019 0212   HGBUR MODERATE (A) 06/07/2019 0212   BILIRUBINUR NEGATIVE 06/07/2019 0212   KETONESUR NEGATIVE 06/07/2019 0212   PROTEINUR 100 (A) 06/07/2019 0212   UROBILINOGEN 0.2 01/07/2015 1723   NITRITE NEGATIVE 06/07/2019 0212   LEUKOCYTESUR NEGATIVE 06/07/2019 0212   Sepsis Labs Invalid input(s): PROCALCITONIN,  WBC,  LACTICIDVEN Microbiology Recent Results (from the past 240 hour(s))  Respiratory Panel by RT PCR (Flu A&B, Covid) - Nasopharyngeal Swab     Status: None   Collection Time: 06/06/19 11:41 PM   Specimen: Nasopharyngeal Swab  Result Value Ref Range Status   SARS Coronavirus 2 by RT PCR NEGATIVE NEGATIVE Final    Comment: (NOTE) SARS-CoV-2 target nucleic acids are NOT DETECTED. The SARS-CoV-2 RNA is generally detectable in upper respiratoy specimens during the acute phase of infection. The lowest concentration of SARS-CoV-2 viral copies this assay can detect is 131 copies/mL. A negative result does not preclude SARS-Cov-2 infection and should not be used as the sole basis for treatment or other patient management decisions. A negative result may occur with  improper specimen collection/handling, submission of specimen other than nasopharyngeal swab, presence of viral mutation(s) within the areas targeted by this assay, and inadequate number of viral copies (<131 copies/mL). A negative result must be combined with clinical observations, patient history, and epidemiological information. The expected result is Negative. Fact Sheet for Patients:   PinkCheek.be Fact Sheet for Healthcare Providers:  GravelBags.it This test is not yet ap proved or cleared by the [place] FDA and  has been authorized for detection and/or diagnosis of SARS-CoV-2 by FDA under an Emergency Use Authorization (EUA). This EUA will remain  in effect (meaning this test can be used) for the duration of the COVID-19 declaration under Section 564(b)(1) of the Act, 21 U.S.C. section 360bbb-3(b)(1), unless the authorization is terminated or revoked sooner.    Influenza A by PCR NEGATIVE NEGATIVE Final   Influenza B by PCR NEGATIVE NEGATIVE Final    Comment: (NOTE) The Xpert Xpress SARS-CoV-2/FLU/RSV assay is intended as an aid in  the diagnosis of influenza from Nasopharyngeal swab specimens and  should not be used as a sole basis for treatment. Nasal washings and  aspirates are unacceptable for Xpert Xpress SARS-CoV-2/FLU/RSV  testing. Fact Sheet for Patients: PinkCheek.be Fact Sheet for Healthcare Providers: GravelBags.it This test is not yet approved or cleared by the [place] FDA and  has been authorized for detection and/or diagnosis of SARS-CoV-2 by  FDA under an Emergency Use Authorization (EUA). This EUA will remain  in effect (meaning this test can be used) for the duration of the  Covid-19 declaration under Section 564(b)(1) of the Act, 21  U.S.C. section 360bbb-3(b)(1), unless the authorization is  terminated or revoked. Performed at [facility], [name], [place], [place] [address]   Blood Culture (routine x 2)     Status: None (Preliminary result)   Collection Time: 06/06/19 11:45 PM   Specimen: BLOOD RIGHT FOREARM  Result Value Ref Range Status   Specimen Description  Final    BLOOD RIGHT FOREARM Performed at [facility], [address], [place], [place] [address]    Special Requests   Final    BOTTLES DRAWN  AEROBIC AND ANAEROBIC Blood Culture adequate volume Performed at [facility], [name], [place], [place] [address]    Culture  Setup Time   Final    GRAM POSITIVE COCCI Gram Stain Report Called to,Read Back By and Verified With: KILMER,T @ 0148 ON 06/11/19 BY JUW ANAEROBIC BOTTLE ONLY GS DONE @ [facility] Performed at [facility], [address], [place], [place] [address]    Culture   Final    CULTURE REINCUBATED FOR BETTER GROWTH Performed at [facility], [place] [address] [place], [place] [address]    Report Status PENDING  Incomplete  Blood Culture (routine x 2)     Status: Abnormal   Collection Time: 06/06/19 11:46 PM   Specimen: BLOOD  Result Value Ref Range Status   Specimen Description   Final    BLOOD RIGHT ANTECUBITAL Performed at [facility], [address], [place], [place] [address]    Special Requests   Final    BOTTLES DRAWN AEROBIC AND ANAEROBIC Blood Culture adequate volume Performed at [facility], [address], [place], [place] [address]    Culture  Setup Time   Final    GRAM POSITIVE COCCI Gram Stain Report Called to,Read Back By and Verified With: LASHLEY,S AT 0730 ON 06/08/19 BY HUFFINES,S. ANAEROBIC BOTTLE ONLY Performed at [facility], [place] [address], [place], [place] [address]    Culture STREPTOCOCCUS GROUP F (A)  Final   Report Status 06/10/2019 FINAL  Final   Organism ID, Bacteria STREPTOCOCCUS GROUP F  Final      Susceptibility   Streptococcus group f - MIC*    PENICILLIN 0.25 INTERMEDIATE Intermediate     CEFTRIAXONE 1 SENSITIVE Sensitive     ERYTHROMYCIN <=0.12 SENSITIVE Sensitive     LEVOFLOXACIN <=0.25 SENSITIVE Sensitive     VANCOMYCIN 0.5 SENSITIVE Sensitive     * STREPTOCOCCUS GROUP F  Urine culture     Status: Abnormal   Collection Time: 06/07/19  2:12 AM   Specimen: Urine, Clean Catch  Result Value Ref Range Status   Specimen Description   Final    URINE, CLEAN CATCH Performed at [facility], [address],  [place], [place] [address]    Special Requests   Final    NONE Performed at [facility], [address] [place], [place] [address]    Culture MULTIPLE SPECIES PRESENT, SUGGEST RECOLLECTION (A)  Final   Report Status 06/08/2019 FINAL  Final  MRSA PCR Screening     Status: None   Collection Time: 06/07/19 10:00 AM   Specimen: Nasal Mucosa; Nasopharyngeal  Result Value Ref Range Status   MRSA by PCR NEGATIVE NEGATIVE Final    Comment:        The GeneXpert MRSA Assay (FDA approved for NASAL specimens only), is one component of a comprehensive MRSA colonization surveillance program. It is not intended to diagnose MRSA infection nor to guide or monitor treatment for MRSA infections. Performed at [facility] [address], [place],  [address]      Time coordinating discharge: 35 minutes  SIGNED:   Rodena Goldmann, DO [facility] 06/12/2019, 10:50 AM  If 7PM-7AM, please contact night-coverage [email or web address]

## 2019-06-13 ENCOUNTER — Telehealth: Payer: Self-pay | Admitting: *Deleted

## 2019-06-13 LAB — CULTURE, BLOOD (ROUTINE X 2): Special Requests: ADEQUATE

## 2019-06-13 LAB — C-PEPTIDE: C-Peptide: 0.2 ng/mL — ABNORMAL LOW (ref 1.1–4.4)

## 2019-06-13 NOTE — Telephone Encounter (Signed)
Linda nurse from [facility] calling about pt. Was in hospital for sacral osteomyelitis and discharged yesterday. On iv vancomycin and rocephin at home. Nurse states they got her meds from [facility] in [place] because that is who her insurance is contracted with. But she has no protocol for labs. States she needs a trough level and what ever else dr Richardson Landry recommends.   Vaughan Basta cell - [phone]

## 2019-06-13 NOTE — Telephone Encounter (Signed)
Ok I reviewed all rec's from the hospital  Every mon and thur do  Vancomycin trough and a met 7  Every Monday add a cbc esr and crp

## 2019-06-13 NOTE — Telephone Encounter (Signed)
Verbal orders given to linda at [facility]

## 2019-06-14 ENCOUNTER — Ambulatory Visit (INDEPENDENT_AMBULATORY_CARE_PROVIDER_SITE_OTHER): Payer: 59 | Admitting: Family Medicine

## 2019-06-14 ENCOUNTER — Telehealth: Payer: Self-pay | Admitting: Family Medicine

## 2019-06-14 ENCOUNTER — Other Ambulatory Visit: Payer: Self-pay

## 2019-06-14 DIAGNOSIS — M4628 Osteomyelitis of vertebra, sacral and sacrococcygeal region: Secondary | ICD-10-CM

## 2019-06-14 NOTE — Progress Notes (Signed)
   Subjective:  Audiovideo  Patient ID: Lindsey Murillo, female    DOB: November 17, 1995, 24 y.o.   MRN: [id number]  HPI  Patient calls for a follow up on recent hospitalization for osteomyelitis of the sacrum.  Virtual Visit via Video Note  I connected with Lindsey Murillo on 06/14/19 at 11:00 AM EST by a video enabled telemedicine application and verified that I am speaking with the correct person using two identifiers.  Location: Patient: home Provider: office   I discussed the limitations of evaluation and management by telemedicine and the availability of in person appointments. The patient expressed understanding and agreed to proceed.  History of Present Illness:    Observations/Objective:   Assessment and Plan:   Follow Up Instructions:    I discussed the assessment and treatment plan with the patient. The patient was provided an opportunity to ask questions and all were answered. The patient agreed with the plan and demonstrated an understanding of the instructions.   The patient was advised to call back or seek an in-person evaluation if the symptoms worsen or if the condition fails to improve as anticipated.  I provided 30 minutes of non-face-to-face time during this encounter.   Patient seen for follow-up after hospitalization.  See prior notes.  Complete hospital record all consult notes all laboratories all imaging reviewed at length today in anticipation of this visit with the patient.  Please also see communications with home health  Patient was admitted to the hospital with fever.  The source was found to be sacral cellulitis along with unfortunately true osteomyelitis.   Patient did have 1+ blood culture of a gram-positive agent which the hospitalist felt was a contaminant   The hospitalist felt that a medical approach was most appropriate.  Patient has longstanding history of diabetes, with unfortunately poor compliance.  Recently she has been  connected with Dr. [place] Fetch and her sugars are slowly improving  Notes no major pain in region of concern.  Home health nurses are engaging her for her IV antibiotic therapy   Review of Systems No current fever no vomiting no headache no chest pain    Objective:   Physical Exam  Virtual      Assessment & Plan:  Impression #1 osteomyelitis of the sacrum.  The hospitalist feel that this was likely secondary to hematogenous spread from the right lower extremity chronic osteomyelitis.  Currently being followed by the orthopedist for this.  Please see discharge summary for recommended vancomycin and Rocephin regimen.  We have also ordered all appropriate tests.  These will be coming to our office through home health.  We will give the full duration of antibiotics.  2.  Type 1 diabetes with history of unfortunately very poor compliance.  This is now having further  ramifications as evidenced by this hospital admission.  Appropriate patient will continue to work with Dr. [place] Fetch to get her sugars under good control  Follow-up in 2 weeks  Greater than 50% of this 40 minute face to face visit was spent in counseling and discussion and coordination of care regarding the above diagnosis/diagnosies

## 2019-06-14 NOTE — Telephone Encounter (Signed)
Patient had a visit on 06/14/19 and Dr Richardson Landry wanted her to come back in 2 weeks, on a Tues or Wed afternoon for a f/u, did not say if in office or virtual.  I called and left msg for patient to call back for an appt.

## 2019-06-20 ENCOUNTER — Other Ambulatory Visit: Payer: Self-pay

## 2019-06-20 ENCOUNTER — Inpatient Hospital Stay (HOSPITAL_COMMUNITY)
Admission: EM | Admit: 2019-06-20 | Discharge: 2019-06-29 | DRG: 683 | Disposition: A | Discharge status: Home Health | Payer: 59 | Attending: Internal Medicine | Admitting: Internal Medicine

## 2019-06-20 ENCOUNTER — Emergency Department (HOSPITAL_COMMUNITY): Payer: 59

## 2019-06-20 ENCOUNTER — Encounter (HOSPITAL_COMMUNITY): Payer: Self-pay | Admitting: Emergency Medicine

## 2019-06-20 ENCOUNTER — Ambulatory Visit: Payer: Self-pay | Admitting: Gastroenterology

## 2019-06-20 ENCOUNTER — Ambulatory Visit (INDEPENDENT_AMBULATORY_CARE_PROVIDER_SITE_OTHER): Payer: 59 | Admitting: Family Medicine

## 2019-06-20 VITALS — BP 106/72 | Temp 98.6°F | Wt 130.0 lb

## 2019-06-20 DIAGNOSIS — M86371 Chronic multifocal osteomyelitis, right ankle and foot: Secondary | ICD-10-CM | POA: Diagnosis present

## 2019-06-20 DIAGNOSIS — J45909 Unspecified asthma, uncomplicated: Secondary | ICD-10-CM | POA: Diagnosis present

## 2019-06-20 DIAGNOSIS — E872 Acidosis: Secondary | ICD-10-CM | POA: Diagnosis present

## 2019-06-20 DIAGNOSIS — E063 Autoimmune thyroiditis: Secondary | ICD-10-CM | POA: Diagnosis present

## 2019-06-20 DIAGNOSIS — R7881 Bacteremia: Secondary | ICD-10-CM | POA: Diagnosis not present

## 2019-06-20 DIAGNOSIS — D6489 Other specified anemias: Secondary | ICD-10-CM | POA: Diagnosis present

## 2019-06-20 DIAGNOSIS — M4628 Osteomyelitis of vertebra, sacral and sacrococcygeal region: Secondary | ICD-10-CM | POA: Diagnosis present

## 2019-06-20 DIAGNOSIS — N179 Acute kidney failure, unspecified: Secondary | ICD-10-CM

## 2019-06-20 DIAGNOSIS — E1142 Type 2 diabetes mellitus with diabetic polyneuropathy: Secondary | ICD-10-CM | POA: Diagnosis present

## 2019-06-20 DIAGNOSIS — Z79899 Other long term (current) drug therapy: Secondary | ICD-10-CM | POA: Diagnosis not present

## 2019-06-20 DIAGNOSIS — B955 Unspecified streptococcus as the cause of diseases classified elsewhere: Secondary | ICD-10-CM | POA: Diagnosis not present

## 2019-06-20 DIAGNOSIS — E1069 Type 1 diabetes mellitus with other specified complication: Secondary | ICD-10-CM | POA: Diagnosis present

## 2019-06-20 DIAGNOSIS — E108 Type 1 diabetes mellitus with unspecified complications: Secondary | ICD-10-CM | POA: Diagnosis not present

## 2019-06-20 DIAGNOSIS — L03317 Cellulitis of buttock: Secondary | ICD-10-CM | POA: Diagnosis present

## 2019-06-20 DIAGNOSIS — Z794 Long term (current) use of insulin: Secondary | ICD-10-CM

## 2019-06-20 DIAGNOSIS — R609 Edema, unspecified: Secondary | ICD-10-CM | POA: Diagnosis present

## 2019-06-20 DIAGNOSIS — Z833 Family history of diabetes mellitus: Secondary | ICD-10-CM | POA: Diagnosis not present

## 2019-06-20 DIAGNOSIS — E1051 Type 1 diabetes mellitus with diabetic peripheral angiopathy without gangrene: Secondary | ICD-10-CM | POA: Diagnosis not present

## 2019-06-20 DIAGNOSIS — Z20822 Contact with and (suspected) exposure to covid-19: Secondary | ICD-10-CM | POA: Diagnosis present

## 2019-06-20 DIAGNOSIS — I10 Essential (primary) hypertension: Secondary | ICD-10-CM | POA: Diagnosis present

## 2019-06-20 DIAGNOSIS — N170 Acute kidney failure with tubular necrosis: Secondary | ICD-10-CM | POA: Diagnosis present

## 2019-06-20 DIAGNOSIS — E1065 Type 1 diabetes mellitus with hyperglycemia: Secondary | ICD-10-CM | POA: Diagnosis present

## 2019-06-20 DIAGNOSIS — K529 Noninfective gastroenteritis and colitis, unspecified: Secondary | ICD-10-CM | POA: Diagnosis present

## 2019-06-20 DIAGNOSIS — D649 Anemia, unspecified: Secondary | ICD-10-CM | POA: Diagnosis present

## 2019-06-20 DIAGNOSIS — E109 Type 1 diabetes mellitus without complications: Secondary | ICD-10-CM | POA: Diagnosis present

## 2019-06-20 DIAGNOSIS — Z9119 Patient's noncompliance with other medical treatment and regimen: Secondary | ICD-10-CM | POA: Diagnosis not present

## 2019-06-20 DIAGNOSIS — L97919 Non-pressure chronic ulcer of unspecified part of right lower leg with unspecified severity: Secondary | ICD-10-CM | POA: Diagnosis present

## 2019-06-20 DIAGNOSIS — K219 Gastro-esophageal reflux disease without esophagitis: Secondary | ICD-10-CM | POA: Diagnosis present

## 2019-06-20 DIAGNOSIS — E1043 Type 1 diabetes mellitus with diabetic autonomic (poly)neuropathy: Secondary | ICD-10-CM | POA: Diagnosis present

## 2019-06-20 DIAGNOSIS — T368X5A Adverse effect of other systemic antibiotics, initial encounter: Secondary | ICD-10-CM | POA: Diagnosis present

## 2019-06-20 DIAGNOSIS — E1042 Type 1 diabetes mellitus with diabetic polyneuropathy: Secondary | ICD-10-CM | POA: Diagnosis present

## 2019-06-20 DIAGNOSIS — E10649 Type 1 diabetes mellitus with hypoglycemia without coma: Secondary | ICD-10-CM | POA: Diagnosis not present

## 2019-06-20 DIAGNOSIS — R195 Other fecal abnormalities: Secondary | ICD-10-CM | POA: Diagnosis not present

## 2019-06-20 DIAGNOSIS — R1310 Dysphagia, unspecified: Secondary | ICD-10-CM

## 2019-06-20 DIAGNOSIS — E10622 Type 1 diabetes mellitus with other skin ulcer: Secondary | ICD-10-CM | POA: Diagnosis present

## 2019-06-20 DIAGNOSIS — N141 Nephropathy induced by other drugs, medicaments and biological substances: Secondary | ICD-10-CM | POA: Diagnosis not present

## 2019-06-20 DIAGNOSIS — E1143 Type 2 diabetes mellitus with diabetic autonomic (poly)neuropathy: Secondary | ICD-10-CM | POA: Diagnosis present

## 2019-06-20 LAB — CBC WITH DIFFERENTIAL/PLATELET
Abs Immature Granulocytes: 0.07 10*3/uL (ref 0.00–0.07)
Basophils Absolute: 0.1 10*3/uL (ref 0.0–0.1)
Basophils Relative: 1 %
Eosinophils Absolute: 0.7 10*3/uL — ABNORMAL HIGH (ref 0.0–0.5)
Eosinophils Relative: 4 %
HCT: 31.8 % — ABNORMAL LOW (ref 36.0–46.0)
Hemoglobin: 9.8 g/dL — ABNORMAL LOW (ref 12.0–15.0)
Immature Granulocytes: 0 %
Lymphocytes Relative: 10 %
Lymphs Abs: 1.8 10*3/uL (ref 0.7–4.0)
MCH: 26.6 pg (ref 26.0–34.0)
MCHC: 30.8 g/dL (ref 30.0–36.0)
MCV: 86.2 fL (ref 80.0–100.0)
Monocytes Absolute: 0.7 10*3/uL (ref 0.1–1.0)
Monocytes Relative: 4 %
Neutro Abs: 14.1 10*3/uL — ABNORMAL HIGH (ref 1.7–7.7)
Neutrophils Relative %: 81 %
Platelets: 498 10*3/uL — ABNORMAL HIGH (ref 150–400)
RBC: 3.69 MIL/uL — ABNORMAL LOW (ref 3.87–5.11)
RDW: 14.8 % (ref 11.5–15.5)
WBC: 17.5 10*3/uL — ABNORMAL HIGH (ref 4.0–10.5)
nRBC: 0 % (ref 0.0–0.2)

## 2019-06-20 LAB — POCT GLUCOSE (DEVICE FOR HOME USE): POC Glucose: 109 mg/dl — AB (ref 70–99)

## 2019-06-20 LAB — COMPREHENSIVE METABOLIC PANEL
ALT: 16 U/L (ref 0–44)
AST: 13 U/L — ABNORMAL LOW (ref 15–41)
Albumin: 2 g/dL — ABNORMAL LOW (ref 3.5–5.0)
Alkaline Phosphatase: 276 U/L — ABNORMAL HIGH (ref 38–126)
Anion gap: 12 (ref 5–15)
BUN: 26 mg/dL — ABNORMAL HIGH (ref 6–20)
CO2: 19 mmol/L — ABNORMAL LOW (ref 22–32)
Calcium: 9.2 mg/dL (ref 8.9–10.3)
Chloride: 104 mmol/L (ref 98–111)
Creatinine, Ser: 2.62 mg/dL — ABNORMAL HIGH (ref 0.44–1.00)
GFR calc Af Amer: 28 mL/min — ABNORMAL LOW (ref >60)
GFR calc non Af Amer: 25 mL/min — ABNORMAL LOW (ref >60)
Glucose, Bld: 109 mg/dL — ABNORMAL HIGH (ref 70–99)
Potassium: 4.3 mmol/L (ref 3.5–5.1)
Sodium: 135 mmol/L (ref 135–145)
Total Bilirubin: 0.6 mg/dL (ref 0.3–1.2)
Total Protein: 8.2 g/dL — ABNORMAL HIGH (ref 6.5–8.1)

## 2019-06-20 LAB — LACTIC ACID, PLASMA: Lactic Acid, Venous: 0.9 mmol/L (ref 0.5–1.9)

## 2019-06-20 LAB — VANCOMYCIN, RANDOM: Vancomycin Rm: 62

## 2019-06-20 LAB — GLUCOSE, CAPILLARY: Glucose-Capillary: 108 mg/dL — ABNORMAL HIGH (ref 70–99)

## 2019-06-20 LAB — RESPIRATORY PANEL BY RT PCR (FLU A&B, COVID)
Influenza A by PCR: NEGATIVE
Influenza B by PCR: NEGATIVE
SARS Coronavirus 2 by RT PCR: NEGATIVE

## 2019-06-20 LAB — C-REACTIVE PROTEIN: CRP: 4.1 mg/dL — ABNORMAL HIGH (ref <1.0)

## 2019-06-20 LAB — SEDIMENTATION RATE: Sed Rate: >140 mm/hr — ABNORMAL HIGH (ref 0–22)

## 2019-06-20 MED ORDER — ACETAMINOPHEN 325 MG PO TABS: 650.0000 mg | ORAL_TABLET | Freq: Four times a day (QID) | ORAL | Status: DC | PRN

## 2019-06-20 MED ORDER — POLYETHYLENE GLYCOL 3350 17 G PO PACK: 17.0000 g | PACK | Freq: Every day | ORAL | Status: DC | PRN

## 2019-06-20 MED ORDER — SODIUM CHLORIDE 0.9 % IV SOLN: INTRAVENOUS | Status: DC

## 2019-06-20 MED ORDER — INSULIN ASPART 100 UNIT/ML ~~LOC~~ SOLN
0.0000 [IU] | Freq: Three times a day (TID) | SUBCUTANEOUS | Status: DC
  Administered 2019-06-25: 1 [IU] via SUBCUTANEOUS
  Administered 2019-06-27: 2 [IU] via SUBCUTANEOUS
  Administered 2019-06-28 – 2019-06-29 (×3): 1 [IU] via SUBCUTANEOUS

## 2019-06-20 MED ORDER — INSULIN ASPART 100 UNIT/ML ~~LOC~~ SOLN
0.0000 [IU] | Freq: Every day | SUBCUTANEOUS | Status: DC
  Administered 2019-06-27: 3 [IU] via SUBCUTANEOUS

## 2019-06-20 MED ORDER — METOPROLOL TARTRATE 50 MG PO TABS
25.0000 mg | ORAL_TABLET | Freq: Two times a day (BID) | ORAL | Status: DC
  Administered 2019-06-20 – 2019-06-23 (×6): 25 mg via ORAL
  Filled 2019-06-20 (×6): qty 1

## 2019-06-20 MED ORDER — ONDANSETRON HCL 4 MG/2ML IJ SOLN
4.0000 mg | Freq: Four times a day (QID) | INTRAMUSCULAR | Status: DC | PRN
  Administered 2019-06-24: 4 mg via INTRAVENOUS
  Filled 2019-06-20: qty 2

## 2019-06-20 MED ORDER — HEPARIN SODIUM (PORCINE) 5000 UNIT/ML IJ SOLN
5000.0000 [IU] | Freq: Three times a day (TID) | INTRAMUSCULAR | Status: DC
  Administered 2019-06-21 – 2019-06-25 (×13): 5000 [IU] via SUBCUTANEOUS
  Filled 2019-06-20 (×13): qty 1

## 2019-06-20 MED ORDER — TRAMADOL HCL 50 MG PO TABS
50.0000 mg | ORAL_TABLET | Freq: Three times a day (TID) | ORAL | Status: DC | PRN
  Administered 2019-06-20 – 2019-06-27 (×6): 50 mg via ORAL
  Filled 2019-06-20 (×6): qty 1

## 2019-06-20 MED ORDER — ONDANSETRON HCL 4 MG PO TABS
4.0000 mg | ORAL_TABLET | Freq: Four times a day (QID) | ORAL | Status: DC | PRN
  Filled 2019-06-20: qty 1

## 2019-06-20 MED ORDER — SODIUM CHLORIDE 0.9 % IV SOLN
2.0000 g | INTRAVENOUS | Status: DC
  Administered 2019-06-21: 2 g via INTRAVENOUS
  Filled 2019-06-20: qty 20

## 2019-06-20 MED ORDER — INSULIN GLARGINE 100 UNIT/ML ~~LOC~~ SOLN
12.0000 [IU] | Freq: Every day | SUBCUTANEOUS | Status: DC
  Administered 2019-06-20: 12 [IU] via SUBCUTANEOUS
  Filled 2019-06-20 (×2): qty 0.12

## 2019-06-20 MED ORDER — ACETAMINOPHEN 650 MG RE SUPP: 650.0000 mg | Freq: Four times a day (QID) | RECTAL | Status: DC | PRN

## 2019-06-20 NOTE — ED Provider Notes (Signed)
[facility] Provider Note   CSN: [id number] Arrival date & time: 06/20/19  1624     History Chief Complaint  Patient presents with  . Abnormal Lab    Lindsey Murillo is a 24 y.o. female.  24yo female sent by PCP for abnormal lab values. Patient was admitted to the hospital 06/06/19-06/12/19 for sacral osteomyelitis and discharged home to follow up with PCP with home health to monitor vancomycin labs. Review of PCP note, vanc trough of 50, unable to get a BMP back after 48 hours with report of leg swelling and concern for renal failure. PCP requesting admission and eventual discharge follow up with specialist involvement. Patient states she feels tired, her legs started to swell the day after she got home from the hospital. Reports decreased urinary output and a few episodes of vomiting. States she has diarrhea constantly, believes she has Crohn's disease however has not been formally diagnosed with this and has had a prior colonoscopy. Patient states sacrum is still tender but not as painful as it was while in the hospital. Patient is also followed for a right foot wound and right lower leg wound that are slowly improving. She denies fevers, chills, cough, abdominal pain, CP, SHOB. No other complaints or concerns.         Past Medical History:  Diagnosis Date  . Acanthosis nigricans, acquired   . Asthma   . Diabetic autonomic neuropathy ([place])   . Diabetic peripheral neuropathy ([place])   . Environmental allergies   . Goiter   . Hypoglycemia associated with diabetes ([place])   . Tachycardia   . Thyroiditis, autoimmune   . Type 1 diabetes mellitus in patient age 92-19 years with HbA1C goal below 7.5     Patient Active Problem List   Diagnosis Date Noted  . 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 Pressure injury of skin 06/10/2019  . Osteomyelitis ([place]) 06/07/2019  . Cellulitis of sacral region   . Vitamin D deficiency 03/20/2019  . Dyspepsia   .  Weight loss, unintentional 08/26/2016  . Chronic diarrhea 04/09/2016  . Type 1 diabetes mellitus with peripheral circulatory complications ([place]) 72/53/6644  . Noncompliance 02/26/2014  . Essential hypertension, benign 11/30/2012  . Acanthosis nigricans, acquired   . Hypoglycemia associated with diabetes ([place])   . Goiter   . Tachycardia   . Diabetic autonomic neuropathy ([place])   . Diabetic peripheral neuropathy ([place])   . Thyroiditis, autoimmune   . Asthma   . Environmental allergies     Past Surgical History:  Procedure Laterality Date  . BIOPSY  08/27/2016   Procedure: BIOPSY;  Surgeon: Danie Binder, MD;  Location: [facility];  Service: Endoscopy;;  duodenum; gastric  . COLONOSCOPY    . ESOPHAGOGASTRODUODENOSCOPY N/A 08/27/2016   Dr. Oneida Alar: mild gastritis. Negative celiac. No obvious source for dyspepsia/diarrhea     OB History    Gravida  1   Para      Term      Preterm      AB      Living  0     SAB      TAB      Ectopic      Multiple      Live Births              Family History  Problem Relation Age of Onset  . Diabetes Mother        Type II DM  . Thyroid disease Mother   .  Diabetes Maternal Grandmother        Type II DM  . Diabetes Cousin        Type II DM  . Colon cancer Neg Hx   . Colon polyps Neg Hx     Social History   Tobacco Use  . Smoking status: Never Smoker  . Smokeless tobacco: Never Used  Substance Use Topics  . Alcohol use: No  . Drug use: No    Home Medications Prior to Admission medications   Medication Sig Start Date End Date Taking? Authorizing Provider  acetaminophen (TYLENOL 8 HOUR ARTHRITIS PAIN) 650 MG CR tablet Take 650 mg by mouth every 8 (eight) hours as needed for pain.   Yes [provider]  bisacodyl (DULCOLAX) 10 MG suppository Place 1 suppository (10 mg total) rectally daily as needed for moderate constipation. 06/12/19  Yes Shah, Pratik D, DO  cefTRIAXone (ROCEPHIN) IVPB Inject 2 g into the  vein daily. Indication:  Sacral osteomyelitis Last Day of Therapy:  07/18/19 Labs - Once weekly:  CBC/D and BMP, Labs - Every other week:  ESR and CRP 06/09/19  Yes Shah, Pratik D, DO  glucagon 1 MG injection Follow package directions for low blood sugar. Patient taking differently: Inject 1 mg into the skin once as needed. Follow package directions for low blood sugar. 01/05/12  Yes Sherrlyn Hock, MD  ibuprofen (ADVIL,MOTRIN) 200 MG tablet Take 600 mg by mouth daily as needed for mild pain or moderate pain.    Yes [provider]  insulin aspart (NOVOLOG FLEXPEN) 100 UNIT/ML FlexPen Inject 8-11 Units into the skin 3 (three) times daily with meals. 03/20/19  Yes Nida, Marella Chimes, MD  insulin glargine (LANTUS) 100 UNIT/ML injection Inject 0.15 mLs (15 Units total) into the skin at bedtime. 06/12/19  Yes Shah, Pratik D, DO  magnesium oxide (MAG-OX) 400 MG tablet Take 1 tablet (400 mg total) by mouth daily. 06/12/19 07/12/19 Yes Shah, Pratik D, DO  metoprolol tartrate (LOPRESSOR) 25 MG tablet Take 1 tablet (25 mg total) by mouth 2 (two) times daily. 06/12/19 07/12/19 Yes Shah, Pratik D, DO  traMADol (ULTRAM) 50 MG tablet Take 1 tablet (50 mg total) by mouth every 8 (eight) hours as needed for moderate pain or severe pain. 06/12/19  Yes Shah, Pratik D, DO  vancomycin IVPB Inject 750 mg into the vein every 12 (twelve) hours. Indication:  Sacral osteomyelitis Last Day of Therapy:  07/18/19 Labs - Sunday/Monday:  CBC/D, BMP, and vancomycin trough. Labs - Thursday:  BMP and vancomycin trough Labs - Every other week:  ESR and CRP 06/09/19  Yes Shah, Pratik D, DO  Blood Glucose Monitoring Suppl (ACCU-CHEK AVIVA) device Use as instructed Patient not taking: Reported on 06/20/2019 06/03/15   Cassandria Anger, MD  Continuous Blood Gluc Sensor (FREESTYLE LIBRE 14 DAY SENSOR) MISC Inject 1 each into the skin every 14 (fourteen) days. Use as directed. Patient not taking: Reported on 06/20/2019 03/20/19    Cassandria Anger, MD  polyethylene glycol (MIRALAX / GLYCOLAX) 17 g packet Take 17 g by mouth daily as needed for mild constipation. Patient not taking: Reported on 06/20/2019 06/12/19   Heath Lark D, DO  Vitamin D, Ergocalciferol, (DRISDOL) 1.25 MG (50000 UT) CAPS capsule Take 1 capsule (50,000 Units total) by mouth every 7 (seven) days. Patient not taking: Reported on 06/07/2019 03/20/19   Cassandria Anger, MD    Allergies    Patient has no known allergies.  Review of Systems  Review of Systems  Constitutional: Positive for fatigue. Negative for chills, diaphoresis and fever.  Respiratory: Negative for shortness of breath.   Cardiovascular: Positive for leg swelling. Negative for chest pain.  Gastrointestinal: Positive for diarrhea, nausea and vomiting. Negative for abdominal pain and constipation.  Genitourinary: Positive for decreased urine volume. Negative for difficulty urinating and dysuria.  Musculoskeletal: Negative for arthralgias and myalgias.  Skin: Positive for wound.  Allergic/Immunologic: Positive for immunocompromised state.  Neurological: Negative for weakness.  All other systems reviewed and are negative.   Physical Exam Updated Vital Signs BP 124/84 (BP Location: Left Arm)   Pulse 95   Temp 97.6 F (36.4 C) (Oral)   Resp 20   Ht 5' 9"  (1.753 m)   Wt 59 kg   LMP 01/18/2019   SpO2 97%   BMI 19.20 kg/m   Physical Exam Vitals and nursing note reviewed.  Constitutional:      General: She is not in acute distress.    Appearance: She is well-developed. She is not diaphoretic.  HENT:     Head: Normocephalic and atraumatic.  Cardiovascular:     Rate and Rhythm: Normal rate and regular rhythm.     Pulses: Normal pulses.     Heart sounds: Normal heart sounds.  Pulmonary:     Effort: Pulmonary effort is normal.     Breath sounds: Normal breath sounds.  Abdominal:     Palpations: Abdomen is soft.     Tenderness: There is no abdominal tenderness.    Musculoskeletal:     Right lower leg: Edema present.     Left lower leg: Edema present.  Skin:    Comments: Healing wound to anterior right lower leg. Right foot without erythema or active drainage. Pink area over sacrum with tenderness, no erythema, no fluctuance or drainage.   Neurological:     Mental Status: She is alert and oriented to person, place, and time.  Psychiatric:        Behavior: Behavior normal.     ED Results / Procedures / Treatments   Labs (all labs ordered are listed, but only abnormal results are displayed) Labs Reviewed  COMPREHENSIVE METABOLIC PANEL - Abnormal; Notable for the following components:      Result Value   CO2 19 (*)    Glucose, Bld 109 (*)    BUN 26 (*)    Creatinine, Ser 2.62 (*)    Total Protein 8.2 (*)    Albumin 2.0 (*)    AST 13 (*)    Alkaline Phosphatase 276 (*)    GFR calc non Af Amer 25 (*)    GFR calc Af Amer 28 (*)    All other components within normal limits  CBC WITH DIFFERENTIAL/PLATELET - Abnormal; Notable for the following components:   WBC 17.5 (*)    RBC 3.69 (*)    Hemoglobin 9.8 (*)    HCT 31.8 (*)    Platelets 498 (*)    Neutro Abs 14.1 (*)    Eosinophils Absolute 0.7 (*)    All other components within normal limits  RESPIRATORY PANEL BY RT PCR (FLU A&B, COVID)  LACTIC ACID, PLASMA  URINALYSIS, ROUTINE W REFLEX MICROSCOPIC  VANCOMYCIN, RANDOM    EKG None  Radiology DG Chest Portable 1 View  Result Date: 06/20/2019 CLINICAL DATA:  Picc line placement. Pt denies any on body medical injector devices. EXAM: PORTABLE CHEST 1 VIEW COMPARISON:  Chest radiograph 04/15/2017 FINDINGS: Interval placement of a right upper extremity PICC  line with tip projecting over the distal SVC. The heart size and mediastinal contours are within normal limits given AP technique. The lungs are clear. No pneumothorax or large pleural effusion. No acute finding in the visualized skeleton. IMPRESSION: Right upper extremity PICC line with  tip projecting over the distal SVC. Electronically Signed   By: Audie Pinto M.D.   On: 06/20/2019 18:09    Procedures Procedures (including critical care time)  Medications Ordered in ED Medications - No data to display  ED Course  I have reviewed the triage vital signs and the nursing notes.  Pertinent labs & imaging results that were available during my care of the patient were reviewed by me and considered in my medical decision making (see chart for details).  Clinical Course as of Jun 19 1849  Wed Jun 19, 1230  [id number] 24 year old female with presentation as above in HPI.  CBC returns with leukocytosis with white count of 17.5 with elevated neutrophils.  CMP with creatinine of 2.62 concerning for AKI.  Lactic acid negative, vitals within normal notes. Chest x-ray with right upper extremity PICC otherwise negative. Case discussed with Dr. Lacinda Axon, ER attending, agrees with consult for admission to hospitalist. Case discussed with Dr. Denton Brick who will consult for admission.    [LM]    Clinical Course User Index [LM] Roque Lias   MDM Rules/Calculators/A&P                      Final Clinical Impression(s) / ED Diagnoses Final diagnoses:  AKI (acute kidney injury) [facility])    Rx / [place] Orders ED Discharge Orders    None       Roque Lias 06/20/19 1851    Nat Christen, MD 06/21/19 1909

## 2019-06-20 NOTE — ED Notes (Signed)
Date and time results received: 06/20/19 1916  Test: vancomycin Critical Value: 47  Name of Provider Notified: [email or web address]  Orders Received? Or Actions Taken?: md notified

## 2019-06-20 NOTE — H&P (Addendum)
History and Physical    Lindsey Murillo TMH:[id number] DOB: 1995/10/12 DOA: 06/20/2019  PCP: Mikey Kirschner, MD   Patient coming from: Home  I have personally briefly reviewed patient's old medical records in [place]  Chief Complaint: Abnormal lab.  HPI: Lindsey Murillo is a 24 y.o. female with medical history significant for diabetic autonomic and peripheral neuropathy, asthma, hypertension, history of noncompliance, acanthosis nigricans, also recently admitted to the hospital for recent hospitalization for sacral osteomyelitis..  Presented to the ED reports of elevated Vanco trough.  She also tells me she has been having diarrhea multiple episodes daily over the past 3 days, no vomiting, no abdominal pain.  Over the past 3 days also she has noticed swelling to her bilateral lower extremities.  She has been lying on the bed for most of the day because walking hurts her back. She denies NSAID use.  No difficulty breathing, no cough, no chest pain.  No abdominal bloating.  Denies personal or family history of blood clots to the lungs or legs.  Recent hospitalization 1/27-2/2-for sepsis secondary to cellulitis of her sacral region with underlying sacral osteomyelitis.  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  Patient was discharged home with a PICC line, to complete 6-week course of IV ceftriaxone 2 g daily, and vancomycin every 12 hours.  Last dose 07/18/2019.  Home health RN.   Patient followed up with her primary care provider on discharge, but today results showed increase in Vanc level-trough level of 50, persistent leukocytosis, and challenges in getting patient's basic metabolic profile, also challenge in arranging ID and orthopedic outpatient follow-up.  ED Course: Stable vitals. Cr elevated 2.6 from baseline 0.5-0.6, Vanco trough 62.  Chest x-ray shows right upper extremity PICC line collection over tip of distal SVC.   Hospitalist called to admit for further evaluation and treatment.  Review of Systems: As per HPI all other systems reviewed and negative.  Past Medical History:  Diagnosis Date  . Acanthosis nigricans, acquired   . Asthma   . Diabetic autonomic neuropathy ([place])   . Diabetic peripheral neuropathy ([place])   . Environmental allergies   . Goiter   . Hypoglycemia associated with diabetes ([place])   . Tachycardia   . Thyroiditis, autoimmune   . Type 1 diabetes mellitus in patient age 69-19 years with HbA1C goal below 7.5     Past Surgical History:  Procedure Laterality Date  . BIOPSY  08/27/2016   Procedure: BIOPSY;  Surgeon: Danie Binder, MD;  Location: [facility];  Service: Endoscopy;;  duodenum; gastric  . COLONOSCOPY    . ESOPHAGOGASTRODUODENOSCOPY N/A 08/27/2016   Dr. Oneida Alar: mild gastritis. Negative celiac. No obvious source for dyspepsia/diarrhea     reports that she has never smoked. She has never used smokeless tobacco. She reports that she does not drink alcohol or use drugs.  No Known Allergies  Family History  Problem Relation Age of Onset  . Diabetes Mother        Type II DM  . Thyroid disease Mother   . Diabetes Maternal Grandmother        Type II DM  . Diabetes Cousin        Type II DM  . Colon cancer Neg Hx   . Colon polyps Neg Hx     Prior to Admission medications   Medication Sig Start Date End Date Taking? Authorizing Provider  acetaminophen (TYLENOL 8 HOUR ARTHRITIS PAIN) 650  MG CR tablet Take 650 mg by mouth every 8 (eight) hours as needed for pain.   Yes [provider]  bisacodyl (DULCOLAX) 10 MG suppository Place 1 suppository (10 mg total) rectally daily as needed for moderate constipation. 06/12/19  Yes Shah, Pratik D, DO  cefTRIAXone (ROCEPHIN) IVPB Inject 2 g into the vein daily. Indication:  Sacral osteomyelitis Last Day of Therapy:  07/18/19 Labs - Once weekly:  CBC/D and BMP, Labs - Every other week:  ESR and CRP 06/09/19  Yes Shah,  Pratik D, DO  glucagon 1 MG injection Follow package directions for low blood sugar. Patient taking differently: Inject 1 mg into the skin once as needed. Follow package directions for low blood sugar. 01/05/12  Yes Sherrlyn Hock, MD  ibuprofen (ADVIL,MOTRIN) 200 MG tablet Take 600 mg by mouth daily as needed for mild pain or moderate pain.    Yes [provider]  insulin aspart (NOVOLOG FLEXPEN) 100 UNIT/ML FlexPen Inject 8-11 Units into the skin 3 (three) times daily with meals. 03/20/19  Yes Nida, Marella Chimes, MD  insulin glargine (LANTUS) 100 UNIT/ML injection Inject 0.15 mLs (15 Units total) into the skin at bedtime. 06/12/19  Yes Shah, Pratik D, DO  magnesium oxide (MAG-OX) 400 MG tablet Take 1 tablet (400 mg total) by mouth daily. 06/12/19 07/12/19 Yes Shah, Pratik D, DO  metoprolol tartrate (LOPRESSOR) 25 MG tablet Take 1 tablet (25 mg total) by mouth 2 (two) times daily. 06/12/19 07/12/19 Yes Shah, Pratik D, DO  traMADol (ULTRAM) 50 MG tablet Take 1 tablet (50 mg total) by mouth every 8 (eight) hours as needed for moderate pain or severe pain. 06/12/19  Yes Shah, Pratik D, DO  vancomycin IVPB Inject 750 mg into the vein every 12 (twelve) hours. Indication:  Sacral osteomyelitis Last Day of Therapy:  07/18/19 Labs - Sunday/Monday:  CBC/D, BMP, and vancomycin trough. Labs - Thursday:  BMP and vancomycin trough Labs - Every other week:  ESR and CRP 06/09/19  Yes Shah, Pratik D, DO  Blood Glucose Monitoring Suppl (ACCU-CHEK AVIVA) device Use as instructed Patient not taking: Reported on 06/20/2019 06/03/15   Cassandria Anger, MD  Continuous Blood Gluc Sensor (FREESTYLE LIBRE 14 DAY SENSOR) MISC Inject 1 each into the skin every 14 (fourteen) days. Use as directed. Patient not taking: Reported on 06/20/2019 03/20/19   Cassandria Anger, MD  polyethylene glycol (MIRALAX / GLYCOLAX) 17 g packet Take 17 g by mouth daily as needed for mild constipation. Patient not taking: Reported on  06/20/2019 06/12/19   Heath Lark D, DO  Vitamin D, Ergocalciferol, (DRISDOL) 1.25 MG (50000 UT) CAPS capsule Take 1 capsule (50,000 Units total) by mouth every 7 (seven) days. Patient not taking: Reported on 06/07/2019 03/20/19   Cassandria Anger, MD    Physical Exam: Vitals:   06/20/19 1638 06/20/19 1639  BP: 124/84   Pulse: 95   Resp: 20   Temp: 97.6 F (36.4 C)   TempSrc: Oral   SpO2: 97%   Weight:  59 kg  Height:  5' 9"  (1.753 m)    Constitutional: NAD, calm, comfortable Vitals:   06/20/19 1638 06/20/19 1639  BP: 124/84   Pulse: 95   Resp: 20   Temp: 97.6 F (36.4 C)   TempSrc: Oral   SpO2: 97%   Weight:  59 kg  Height:  5' 9"  (1.753 m)   Eyes: PERRL, lids and conjunctivae normal ENMT: Mucous membranes are moist. Posterior pharynx clear  of any exudate or lesions.  Neck: normal, supple, no masses, no thyromegaly Respiratory: Normal respiratory effort. No accessory muscle use.  Cardiovascular: Regular rate and rhythm, 1 + pitting extremity edema to knees. 2+ pedal pulses.  Abdomen: no tenderness, no masses palpated. No hepatosplenomegaly. Bowel sounds positive.  Musculoskeletal: no clubbing / cyanosis. No joint deformity upper and lower extremities. Good ROM, no contractures.  PICC to right upper arm, surrounding skin without erythema drainage or warmth. Skin: Sacral wound mostly healed, ulceration to right leg also healing, no surrounding warmth, redness or erythema, No induration.  No drainage from either wounds. Neurologic: No gross cranial nerve abnormality, moving all extremities spontaneously. Psychiatric: Normal judgment and insight. Alert and oriented x 3. Normal mood.   Labs on Admission: I have personally reviewed following labs and imaging studies  CBC: Recent Labs  Lab 06/20/19 1710  WBC 17.5*  NEUTROABS 14.1*  HGB 9.8*  HCT 31.8*  MCV 86.2  PLT 706*   Basic Metabolic Panel: Recent Labs  Lab 06/20/19 1710  NA 135  K 4.3  CL 104  CO2 19*    GLUCOSE 109*  BUN 26*  CREATININE 2.62*  CALCIUM 9.2   Liver Function Tests: Recent Labs  Lab 06/20/19 1710  AST 13*  ALT 16  ALKPHOS 276*  BILITOT 0.6  PROT 8.2*  ALBUMIN 2.0*   Urine analysis:    Component Value Date/Time   COLORURINE YELLOW 06/07/2019 0212   APPEARANCEUR HAZY (A) 06/07/2019 0212   LABSPEC 1.016 06/07/2019 0212   PHURINE 5.0 06/07/2019 0212   GLUCOSEU NEGATIVE 06/07/2019 0212   HGBUR MODERATE (A) 06/07/2019 0212   BILIRUBINUR NEGATIVE 06/07/2019 0212   KETONESUR NEGATIVE 06/07/2019 0212   PROTEINUR 100 (A) 06/07/2019 0212   UROBILINOGEN 0.2 01/07/2015 1723   NITRITE NEGATIVE 06/07/2019 0212   LEUKOCYTESUR NEGATIVE 06/07/2019 0212    Radiological Exams on Admission: DG Chest Portable 1 View  Result Date: 06/20/2019 CLINICAL DATA:  Picc line placement. Pt denies any on body medical injector devices. EXAM: PORTABLE CHEST 1 VIEW COMPARISON:  Chest radiograph 04/15/2017 FINDINGS: Interval placement of a right upper extremity PICC line with tip projecting over the distal SVC. The heart size and mediastinal contours are within normal limits given [facility] technique. The lungs are clear. No pneumothorax or large pleural effusion. No acute finding in the visualized skeleton. IMPRESSION: Right upper extremity PICC line with tip projecting over the distal SVC. Electronically Signed   By: Audie Pinto M.D.   On: 06/20/2019 18:09    EKG: None.  Assessment/Plan Principal Problem:   Vancomycin-induced nephrotoxicity Active Problems:   Diabetic autonomic neuropathy ([name])   Chronic diarrhea   Osteomyelitis ([name])  Vancomycin-induced nephrotoxicity-Vanco trough 62.  With creatinine 2.62 baseline 0.5-0.6.  With new bilateral lower extremity swelling. -Gentle fluids N/s 75cc/hr  -Obtain UA  Sacral osteomyelitis-MRI sacrum findings suspicious for sacral spondylitis 06/07/19.  Persistent leukocytosis 17.5.  Not septic.  ID consulted in recent  hospitalization-recommended Vanc and ceftriaxone.  General surgery consulted on recent hospitalization, surgical intervention was not needed.  To complete 6-week course of IV antibiotics IV ceftriaxone and vancomycin-to complete last dose 07/18/19.  PICC line present.  Discharged home with home health. -Continue IV ceftriaxone 2 g daily -Hold IV vancomycin -Would recommend phone consult with ID in a.m. -Daily CBC, CMP -Obtain ESR, CRP  Bilateral leg swelling- differentials include fluid retention from acute kidney injury vs DVT versus immobility.  No chest pain, no dyspnea, no hypoxia or tachycardia. -Bilateral  lower extremity venous Dopplers -Heparin for DVT prophylaxis  Elevated ALP- 276, chronic elevation of ALP, improving.  Likely from bone.  Abdominal CT 06/06/19 normal liver, mild diffuse periportal edema, contracted gallbladder without abnormalities, no biliary dilatation. -CMP a.m.  Uncontrolled diabetes mellitus with neuropathy autonomic and peripheral-blood glucose 109.  History of noncompliance. 05/09/19- hemoglobin A1c 9.3. -Resume home medication Lantus at reduced dose  15 > 12 units nightly - SSI- s  Hypertension-stable. -Resume Home metoprolol  Asthma-stable.  Chronic anemia- hemoglobin 9.8, required transfusion on recent hospitalization, discharge hemoglobin 8.5.  Platelets 498.  Recent hospitalization anemia panel suggesting iron deficiency with low serum iron 11, low iron saturation at 4 with normal ferritin 131-but in the setting of acute infection.  Likely component of chronic disease also.  Denies NSAID use. -Will need to follow-up with her gynecologist.  Chronic diarrhea- followed with gastroenterologist, thought secondary to diabetic enteropathy or pancreatic insufficiency.  She has a history of autonomic neuropathy also.  Reports multiple stools yesterday but one episode today.   DVT prophylaxis: Heparin for DVT prophylaxis Code Status: Full code Family  Communication: None at bedside Disposition Plan: > 2 days, pending improvement in renal function. Consults called: None Admission status: Inpatient, telemetry I certify that at the point of admission it is my clinical judgment that the patient will require inpatient hospital care spanning beyond 2 midnights from the point of admission due to high intensity of service, high risk for further deterioration and high frequency of surveillance required. The following factors support the patient status of inpatient: Acute kidney injury   Bethena Roys MD [facility]  06/20/2019, 8:54 PM

## 2019-06-20 NOTE — ED Triage Notes (Signed)
CBG checked at PCP, 109.

## 2019-06-20 NOTE — ED Notes (Signed)
Pt referred to ED by PCP regarding need for vancomycin troughs and other abnormal labs. Pt reports was recently admitted for sacral wound/infection. PICC line noted to right upper arm for vancomycin treatments at home. Last treatment this am.

## 2019-06-20 NOTE — Progress Notes (Signed)
   Subjective:    Patient ID: Lindsey Murillo, female    DOB: August 11, 1995, 24 y.o.   MRN: [id number]  HPI  Patient arrives for a follow up from recent hospitalization for osteomyelitis of sacrum. Mother states the patient has no appetite and when she does it it comes back up- not sure if due to meds or not. Patient also having swelling in legs since discharged from the hospital.  Blood sugar 109- last ate around 4 hours ago Review of Systems     Objective:   Physical Exam Alert mild malaise vitals stable lungs clear heart regular rate and rhythm.  No CVA tenderness.  Question sinus tract evident at perianal region.  Some moderate tenderness in the posterior sacrum on involved side.  Ankles 1-2+ edema bilateral       Assessment & Plan:  IMP. sacral osteomyelitis type I diabetic with history of profound noncompliance.  Spent only 3 days in the hospital.  Was sent home basically with the expectation that we would be her infectious disease/orthopedic/osteomyelitis/type I diabetic/nephrology expert during the management of this challenging situation.  Sadly her insurance demands that any blood be sent off and not done through normal channels.  I was called by home health 24 hours after a trough was ordered to be told the numbers were very elevated at 50.  CBC unfortunately showed white blood count still high.   met 7 is still not available.  48 hours since blood drawn and still do not have met 7!!!!!  There is no way we can manage dose to dose vancomycin levels with such poor access to stat blood results.  Pt noting substantial swelling of the legs. vanco was held yest after H H reported a trough vance of 50.  I am concerned for the potential of acute renal failure.  I have no idea what her current vancomycin is.  My concerns are she may need to be in the hospital.  At the very least we need to make sure her leg swelling is not involving renal function issues.  I spoke with the emergency room or go  to the ER to get some stat blood work and further assessment.  I hope the hospital will consider admitting her and sorting through all this and making some attempt specialty referrals before discharge again.  Greater than 50% of this 40 minute face to face visit was spent in counseling and discussion and coordination of care regarding the above diagnosis/diagnosies

## 2019-06-20 NOTE — ED Notes (Signed)
Pt is aware we need urine.

## 2019-06-20 NOTE — ED Notes (Signed)
Phlebotomy collected labs. Will verify placement of PICC line.

## 2019-06-20 NOTE — ED Triage Notes (Signed)
Patient reports she was sent by PCP for abnormal labs.

## 2019-06-21 ENCOUNTER — Inpatient Hospital Stay (HOSPITAL_COMMUNITY): Payer: 59

## 2019-06-21 ENCOUNTER — Encounter (HOSPITAL_BASED_OUTPATIENT_CLINIC_OR_DEPARTMENT_OTHER): Payer: 59 | Admitting: Internal Medicine

## 2019-06-21 DIAGNOSIS — D649 Anemia, unspecified: Secondary | ICD-10-CM | POA: Diagnosis present

## 2019-06-21 DIAGNOSIS — M86371 Chronic multifocal osteomyelitis, right ankle and foot: Secondary | ICD-10-CM | POA: Diagnosis present

## 2019-06-21 DIAGNOSIS — B955 Unspecified streptococcus as the cause of diseases classified elsewhere: Secondary | ICD-10-CM | POA: Diagnosis present

## 2019-06-21 LAB — COMPREHENSIVE METABOLIC PANEL
ALT: 11 U/L (ref 0–44)
AST: 9 U/L — ABNORMAL LOW (ref 15–41)
Albumin: 1.4 g/dL — ABNORMAL LOW (ref 3.5–5.0)
Alkaline Phosphatase: 187 U/L — ABNORMAL HIGH (ref 38–126)
Anion gap: 3 — ABNORMAL LOW (ref 5–15)
BUN: 27 mg/dL — ABNORMAL HIGH (ref 6–20)
CO2: 22 mmol/L (ref 22–32)
Calcium: 8.3 mg/dL — ABNORMAL LOW (ref 8.9–10.3)
Chloride: 111 mmol/L (ref 98–111)
Creatinine, Ser: 2.57 mg/dL — ABNORMAL HIGH (ref 0.44–1.00)
GFR calc Af Amer: 29 mL/min — ABNORMAL LOW (ref >60)
GFR calc non Af Amer: 25 mL/min — ABNORMAL LOW (ref >60)
Glucose, Bld: 91 mg/dL (ref 70–99)
Potassium: 4.1 mmol/L (ref 3.5–5.1)
Sodium: 136 mmol/L (ref 135–145)
Total Bilirubin: 0.6 mg/dL (ref 0.3–1.2)
Total Protein: 5.6 g/dL — ABNORMAL LOW (ref 6.5–8.1)

## 2019-06-21 LAB — URINALYSIS, ROUTINE W REFLEX MICROSCOPIC
Bilirubin Urine: NEGATIVE
Glucose, UA: NEGATIVE mg/dL
Hgb urine dipstick: NEGATIVE
Ketones, ur: NEGATIVE mg/dL
Leukocytes,Ua: NEGATIVE
Nitrite: NEGATIVE
Protein, ur: 100 mg/dL — AB
Specific Gravity, Urine: 1.009 (ref 1.005–1.030)
pH: 5 (ref 5.0–8.0)

## 2019-06-21 LAB — CBC
HCT: 28.1 % — ABNORMAL LOW (ref 36.0–46.0)
Hemoglobin: 8.8 g/dL — ABNORMAL LOW (ref 12.0–15.0)
MCH: 26.6 pg (ref 26.0–34.0)
MCHC: 31.3 g/dL (ref 30.0–36.0)
MCV: 84.9 fL (ref 80.0–100.0)
Platelets: 396 10*3/uL (ref 150–400)
RBC: 3.31 MIL/uL — ABNORMAL LOW (ref 3.87–5.11)
RDW: 14.5 % (ref 11.5–15.5)
WBC: 14.1 10*3/uL — ABNORMAL HIGH (ref 4.0–10.5)
nRBC: 0 % (ref 0.0–0.2)

## 2019-06-21 LAB — GLUCOSE, CAPILLARY
Glucose-Capillary: 66 mg/dL — ABNORMAL LOW (ref 70–99)
Glucose-Capillary: 83 mg/dL (ref 70–99)
Glucose-Capillary: 89 mg/dL (ref 70–99)
Glucose-Capillary: 87 mg/dL (ref 70–99)

## 2019-06-21 MED ORDER — IPRATROPIUM-ALBUTEROL 0.5-2.5 (3) MG/3ML IN SOLN: 3.0000 mL | RESPIRATORY_TRACT | Status: DC | PRN

## 2019-06-21 MED ORDER — SENNOSIDES-DOCUSATE SODIUM 8.6-50 MG PO TABS: 2.0000 | ORAL_TABLET | Freq: Every evening | ORAL | Status: DC | PRN

## 2019-06-21 MED ORDER — CHLORHEXIDINE GLUCONATE CLOTH 2 % EX PADS
6.0000 | MEDICATED_PAD | Freq: Every day | CUTANEOUS | Status: DC
  Administered 2019-06-21 – 2019-06-29 (×7): 6 via TOPICAL

## 2019-06-21 MED ORDER — SODIUM CHLORIDE 0.9 % IV SOLN
1.0000 g | INTRAVENOUS | Status: DC
  Administered 2019-06-21 – 2019-06-24 (×4): 1000 mg via INTRAVENOUS
  Filled 2019-06-21 (×7): qty 1

## 2019-06-21 MED ORDER — INSULIN GLARGINE 100 UNIT/ML ~~LOC~~ SOLN
6.0000 [IU] | Freq: Every day | SUBCUTANEOUS | Status: DC
  Administered 2019-06-21: 6 [IU] via SUBCUTANEOUS
  Filled 2019-06-21 (×4): qty 0.06

## 2019-06-21 NOTE — Consult Note (Addendum)
[name]    Date of Admission:  06/20/2019   Total days of antibiotics 16               Reason for Consult: Vancomycin induced nephrotoxicity on treatment for sacral osteomyelitis   Referring Provider: Dr. Gerlean Ren Primary Care Provider: Dr. Mickie Hillier  Assessment: I agree that her acute kidney injury is almost certainly due to vancomycin.  Fortunately I see no need to continue it.  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  I cannot tell from the existing notes whether or not it is believed that that has been cured or if it is still active.  She also now has streptococcal bacteremia complicating buttock cellulitis and sacral osteomyelitis.  I would assume that both of the sites of infection involve mixed aerobic/anaerobic organisms.  I recommend completing therapy with once daily IV ertapenem.  Plan: 1. 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 Chlorhexidine Gluconate Cloth  6 each Topical Daily  . heparin injection (subcutaneous)  5,000 Units Subcutaneous Q8H  . insulin aspart  0-5 Units Subcutaneous QHS  . insulin aspart  0-9 Units Subcutaneous TID WC  . insulin glargine  6 Units Subcutaneous QHS  . metoprolol tartrate  25 mg Oral BID   Continuous Infusions: . sodium chloride 100 mL/hr at 06/21/19 1113  . cefTRIAXone (ROCEPHIN)  IV Stopped (06/21/19 0845)   PRN Meds:.acetaminophen **OR** acetaminophen, ipratropium-albuterol, ondansetron **OR** ondansetron  (ZOFRAN) IV, polyethylene glycol, senna-docusate, traMADol  HPI: Lindsey Murillo is a 24 y.o. female with poorly controlled type 1 diabetes complicated by peripheral neuropathy.  Last year she developed right foot osteomyelitis and was followed by Dr. Dossie Der at the wound center.  She had the following wound culture results:  2/20  group B strep 3/20  group B strep and MSSA 11/20  group G strep 12/20  group B strep and Serratia  She was treated with Augmentin.  She was admitted to the hospital on 06/03/2019 with a fever of 101.1 degrees and right buttock cellulitis.  MRI showed right buttock cellulitis without abscess and probable sacral osteomyelitis.  Both admission blood cultures grew group F strep.  She was discharged on 06/12/2019 with a plan to receive IV vancomycin and ceftriaxone for 6 weeks.  Unfortunately, no arrangements were made for how her therapy would be monitored and by the time that was set up by Dr. Wolfgang Phoenix she was found to have an elevated vancomycin level and acute kidney injury leading to readmission yesterday.   Review of Systems: Review of Systems  Unable to perform ROS: Other  Constitutional:       This is a remote consultation so no review of systems was obtained.    Past Medical History:  Diagnosis Date  . Acanthosis nigricans, acquired   . Asthma   . Diabetic autonomic neuropathy ([place])   . Diabetic peripheral neuropathy ([place])   . Environmental allergies   . Goiter   . Hypoglycemia associated with diabetes ([place])   .  Tachycardia   . Thyroiditis, autoimmune   . Type 1 diabetes mellitus in patient age 65-19 years with HbA1C goal below 7.5     Social History   Tobacco Use  . Smoking status: Never Smoker  . Smokeless tobacco: Never Used  Substance Use Topics  . Alcohol use: No  . Drug use: No    Family History  Problem Relation Age of Onset  . Diabetes Mother        Type II DM  . Thyroid disease Mother   . Diabetes Maternal Grandmother         Type II DM  . Diabetes Cousin        Type II DM  . Colon cancer Neg Hx   . Colon polyps Neg Hx    No Known Allergies  OBJECTIVE: Blood pressure (!) 131/93, pulse 92, temperature 98.9 F (37.2 C), temperature source Oral, resp. rate 20, height 5\' 9"  (1.753 m), weight 57.7 kg, last menstrual period 01/18/2019, SpO2 100 %, unknown if currently breastfeeding.  Physical Exam Constitutional:      Comments: This is a remote consultation so no physical examination was performed.     Lab Results Lab Results  Component Value Date   WBC 14.1 (H) 06/21/2019   HGB 8.8 (L) 06/21/2019   HCT 28.1 (L) 06/21/2019   MCV 84.9 06/21/2019   PLT 396 06/21/2019    Lab Results  Component Value Date   CREATININE 2.57 (H) 06/21/2019   BUN 27 (H) 06/21/2019   NA 136 06/21/2019   K 4.1 06/21/2019   CL 111 06/21/2019   CO2 22 06/21/2019    Lab Results  Component Value Date   ALT 11 06/21/2019   AST 9 (L) 06/21/2019   ALKPHOS 187 (H) 06/21/2019   BILITOT 0.6 06/21/2019     Microbiology: Recent Results (from the past 240 hour(s))  Respiratory Panel by RT PCR (Flu A&B, Covid) - Nasopharyngeal Swab     Status: None   Collection Time: 06/20/19  6:54 PM   Specimen: Nasopharyngeal Swab  Result Value Ref Range Status   SARS Coronavirus 2 by RT PCR NEGATIVE NEGATIVE Final    Comment: (NOTE) SARS-CoV-2 target nucleic acids are NOT DETECTED. The SARS-CoV-2 RNA is generally detectable in upper respiratoy specimens during the acute phase of infection. The lowest concentration of SARS-CoV-2 viral copies this assay can detect is 131 copies/mL. A negative result does not preclude SARS-Cov-2 infection and should not be used as the sole basis for treatment or other patient management decisions. A negative result may occur with  improper specimen collection/handling, submission of specimen other than nasopharyngeal swab, presence of viral mutation(s) within the areas targeted by this assay, and  inadequate number of viral copies (<131 copies/mL). A negative result must be combined with clinical observations, patient history, and epidemiological information. The expected result is Negative. Fact Sheet for Patients:  PinkCheek.be Fact Sheet for Healthcare Providers:  GravelBags.it This test is not yet ap proved or cleared by the [place] FDA and  has been authorized for detection and/or diagnosis of SARS-CoV-2 by FDA under an Emergency Use Authorization (EUA). This EUA will remain  in effect (meaning this test can be used) for the duration of the COVID-19 declaration under Section 564(b)(1) of the Act, 21 U.S.C. section 360bbb-3(b)(1), unless the authorization is terminated or revoked sooner.    Influenza A by PCR NEGATIVE NEGATIVE Final   Influenza B by PCR NEGATIVE NEGATIVE Final    Comment: (NOTE)  The Xpert Xpress SARS-CoV-2/FLU/RSV assay is intended as an aid in  the diagnosis of influenza from Nasopharyngeal swab specimens and  should not be used as a sole basis for treatment. Nasal washings and  aspirates are unacceptable for Xpert Xpress SARS-CoV-2/FLU/RSV  testing. Fact Sheet for Patients: PinkCheek.be Fact Sheet for Healthcare Providers: GravelBags.it This test is not yet approved or cleared by the [place] FDA and  has been authorized for detection and/or diagnosis of SARS-CoV-2 by  FDA under an Emergency Use Authorization (EUA). This EUA will remain  in effect (meaning this test can be used) for the duration of the  Covid-19 declaration under Section 564(b)(1) of the Act, 21  U.S.C. section 360bbb-3(b)(1), unless the authorization is  terminated or revoked. Performed at [facility], [address], [place], [place] [address]     Michel Bickers, [place] [facility] [place] [phone] pager   [phone] cell 06/21/2019, 1:48 PM

## 2019-06-21 NOTE — Progress Notes (Signed)
PROGRESS NOTE    Lindsey Murillo  [id number] DOB: [birth year] DOA: 06/20/2019 PCP: Mikey Kirschner, MD   Brief Narrative:  24 year old with history of diabetic autonomic/peripheral neuropathy, asthma, hypertension, medical noncompliance, acanthosis nigricans recently admitted for sacral osteomyelitis discharged on vancomycin and Rocephin with last dose 07/18/2019.  Comes back to the hospital with complaints of diarrhea and bilateral lower extremity swelling.  Found to be in acute kidney injury upon admission.   Assessment & Plan:   Principal Problem:   Vancomycin-induced nephrotoxicity Active Problems:   Diabetic autonomic neuropathy ([name])   Chronic diarrhea   Osteomyelitis ([name])   Acute kidney injury Vancomycin induced nephrotoxicity -Baseline creatinine 0.6, admission creatinine 2.6 -UA-negative, renal ultrasound-negative for acute pathology. -Gentle hydration, monitor urine output -Suspect ATN, hopeful recovery over next 48 hours.  Bilateral lower extremity swelling -Unclear etiology.  Lower extremity Dopplers-Nefg   Sacral osteomyelitis -Was on IV Rocephin 2 g daily and vancomycin every 12 hours.  Last dose 07/18/2019 -CRP 4.1 -Spoke with infectious disease- Dr Megan Salon, they believe the recommendation in the chart.   Diabetes mellitus type 1 Peripheral neuropathy/autonomic dysfunction -Lantus 12 units at bedtime.  Insulin sliding scale and Accu-Cheks.  Chronic normocytic anemia -Baseline hemoglobin.  8.8  Essential hypertension -Continue home meds-metoprolol  DVT prophylaxis: Subcutaneous heparin Code Status: Full code Family Communication:   Disposition Plan:   Patient From= home  Patient Anticipated D/C place= Home  Barriers= maintain hospital stay until renal function improves.  Acute nephrotoxic renal injury, unsafe for discharge.  Need to monitor urine output closely.   Subjective: No complaints besides lower back pain.  Has not produced any  urine yet this morning.  Review of Systems Otherwise negative except as per HPI, including: General: Denies fever, chills, night sweats or unintended weight loss. Resp: Denies cough, wheezing, shortness of breath. Cardiac: Denies chest pain, palpitations, orthopnea, paroxysmal nocturnal dyspnea. GI: Denies abdominal pain, nausea, vomiting, diarrhea or constipation GU: Denies dysuria, frequency, hesitancy or incontinence MS: Denies muscle aches, joint pain or swelling Neuro: Denies headache, neurologic deficits (focal weakness, numbness, tingling), abnormal gait Psych: Denies anxiety, depression, SI/HI/AVH Skin: Denies new rashes or lesions ID: Denies sick contacts, exotic exposures, travel  Examination:  General exam: Appears calm and comfortable  Respiratory system: Clear to auscultation. Respiratory effort normal. Cardiovascular system: S1 & S2 heard, RRR. No JVD, murmurs, rubs, gallops or clicks. No pedal edema. Gastrointestinal system: Abdomen is nondistended, soft and nontender. No organomegaly or masses felt. Normal bowel sounds heard. Central nervous system: Alert and oriented. No focal neurological deficits. Extremities: Symmetric 5 x 5 power. Skin: Sacral wound noted which is mostly healed.  Ulceration of the right leg which appears to be healing as well. Psychiatry: Judgement and insight appear normal. Mood & affect appropriate.  Right upper extremity PICC line in place   Objective: Vitals:   06/20/19 1639 06/20/19 2100 06/20/19 2108 06/21/19 0500  BP:  (!) 149/105  124/87  Pulse:  (!) 112  93  Resp:      Temp:  98.7 F (37.1 C)  98.9 F (37.2 C)  TempSrc:  Oral  Oral  SpO2:  100% 100% 100%  Weight: 59 kg 57.7 kg    Height: 5\' 9"  (1.753 m) 5\' 9"  (1.753 m)      Intake/Output Summary (Last 24 hours) at 06/21/2019 0759 Last data filed at 06/21/2019 0300 Gross per 24 hour  Intake 644.54 ml  Output --  Net 644.54 ml   [employer]  06/20/19 1639 06/20/19 2100   Weight: 59 kg 57.7 kg     Data Reviewed:   CBC: Recent Labs  Lab 06/20/19 1710 06/21/19 0402  WBC 17.5* 14.1*  NEUTROABS 14.1*  --   HGB 9.8* 8.8*  HCT 31.8* 28.1*  MCV 86.2 84.9  PLT 498* [id number]   Basic Metabolic Panel: Recent Labs  Lab 06/20/19 1710 06/21/19 0402  NA 135 136  K 4.3 4.1  CL 104 111  CO2 19* 22  GLUCOSE 109* 91  BUN 26* 27*  CREATININE 2.62* 2.57*  CALCIUM 9.2 8.3*   GFR: Estimated Creatinine Clearance: 30.7 mL/min (A) (by C-G formula based on SCr of 2.57 mg/dL (H)). Liver Function Tests: Recent Labs  Lab 06/20/19 1710 06/21/19 0402  AST 13* 9*  ALT 16 11  ALKPHOS 276* 187*  BILITOT 0.6 0.6  PROT 8.2* 5.6*  ALBUMIN 2.0* 1.4*   No results for input(s): LIPASE, AMYLASE in the last 168 hours. No results for input(s): AMMONIA in the last 168 hours. Coagulation Profile: No results for input(s): INR, PROTIME in the last 168 hours. Cardiac Enzymes: No results for input(s): CKTOTAL, CKMB, CKMBINDEX, TROPONINI in the last 168 hours. BNP (last 3 results) No results for input(s): PROBNP in the last 8760 hours. HbA1C: No results for input(s): HGBA1C in the last 72 hours. CBG: Recent Labs  Lab 06/20/19 2143 06/21/19 0754  GLUCAP 108* 66*   Lipid Profile: No results for input(s): CHOL, HDL, LDLCALC, TRIG, CHOLHDL, LDLDIRECT in the last 72 hours. Thyroid Function Tests: No results for input(s): TSH, T4TOTAL, FREET4, T3FREE, THYROIDAB in the last 72 hours. Anemia Panel: No results for input(s): VITAMINB12, FOLATE, FERRITIN, TIBC, IRON, RETICCTPCT in the last 72 hours. Sepsis Labs: Recent Labs  Lab 06/20/19 1710  LATICACIDVEN 0.9    Recent Results (from the past 240 hour(s))  Respiratory Panel by RT PCR (Flu A&B, Covid) - Nasopharyngeal Swab     Status: None   Collection Time: 06/20/19  6:54 PM   Specimen: Nasopharyngeal Swab  Result Value Ref Range Status   SARS Coronavirus 2 by RT PCR NEGATIVE NEGATIVE Final    Comment:  (NOTE) SARS-CoV-2 target nucleic acids are NOT DETECTED. The SARS-CoV-2 RNA is generally detectable in upper respiratoy specimens during the acute phase of infection. The lowest concentration of SARS-CoV-2 viral copies this assay can detect is 131 copies/mL. A negative result does not preclude SARS-Cov-2 infection and should not be used as the sole basis for treatment or other patient management decisions. A negative result may occur with  improper specimen collection/handling, submission of specimen other than nasopharyngeal swab, presence of viral mutation(s) within the areas targeted by this assay, and inadequate number of viral copies (<131 copies/mL). A negative result must be combined with clinical observations, patient history, and epidemiological information. The expected result is Negative. Fact Sheet for Patients:  PinkCheek.be Fact Sheet for Healthcare Providers:  GravelBags.it This test is not yet ap proved or cleared by the [place] FDA and  has been authorized for detection and/or diagnosis of SARS-CoV-2 by FDA under an Emergency Use Authorization (EUA). This EUA will remain  in effect (meaning this test can be used) for the duration of the COVID-19 declaration under Section 564(b)(1) of the Act, 21 U.S.C. section 360bbb-3(b)(1), unless the authorization is terminated or revoked sooner.    Influenza A by PCR NEGATIVE NEGATIVE Final   Influenza B by PCR NEGATIVE NEGATIVE Final    Comment: (NOTE) The Xpert Xpress SARS-CoV-2/FLU/RSV assay is  intended as an aid in  the diagnosis of influenza from Nasopharyngeal swab specimens and  should not be used as a sole basis for treatment. Nasal washings and  aspirates are unacceptable for Xpert Xpress SARS-CoV-2/FLU/RSV  testing. Fact Sheet for Patients: PinkCheek.be Fact Sheet for Healthcare  Providers: GravelBags.it This test is not yet approved or cleared by the [place] FDA and  has been authorized for detection and/or diagnosis of SARS-CoV-2 by  FDA under an Emergency Use Authorization (EUA). This EUA will remain  in effect (meaning this test can be used) for the duration of the  Covid-19 declaration under Section 564(b)(1) of the Act, 21  U.S.C. section 360bbb-3(b)(1), unless the authorization is  terminated or revoked. Performed at [facility], [address] [place], [place] [address]          Radiology Studies: DG Chest Portable 1 View  Result Date: 06/20/2019 CLINICAL DATA:  Picc line placement. Pt denies any on body medical injector devices. EXAM: PORTABLE CHEST 1 VIEW COMPARISON:  Chest radiograph 04/15/2017 FINDINGS: Interval placement of a right upper extremity PICC line with tip projecting over the distal SVC. The heart size and mediastinal contours are within normal limits given AP technique. The lungs are clear. No pneumothorax or large pleural effusion. No acute finding in the visualized skeleton. IMPRESSION: Right upper extremity PICC line with tip projecting over the distal SVC. Electronically Signed   By: Audie Pinto M.D.   On: 06/20/2019 18:09        Scheduled Meds: . Chlorhexidine Gluconate Cloth  6 each Topical Daily  . heparin injection (subcutaneous)  5,000 Units Subcutaneous Q8H  . insulin aspart  0-5 Units Subcutaneous QHS  . insulin aspart  0-9 Units Subcutaneous TID WC  . insulin glargine  12 Units Subcutaneous QHS  . metoprolol tartrate  25 mg Oral BID   Continuous Infusions: . sodium chloride 75 mL/hr at 06/20/19 2136  . cefTRIAXone (ROCEPHIN)  IV       LOS: 1 day   Time spent= 35 mins    Nikki Glanzer Arsenio Loader, MD [facility]  If 7PM-7AM, please contact night-coverage  06/21/2019, 7:59 AM

## 2019-06-21 NOTE — Progress Notes (Addendum)
Inpatient Diabetes Program Recommendations  AACE/ADA: New Consensus Statement on Inpatient Glycemic Control (2015)  Target Ranges:  Prepandial:   less than 140 mg/dL      Peak postprandial:   less than 180 mg/dL (1-2 hours)      Critically ill patients:  140 - 180 mg/dL   Lab Results  Component Value Date   GLUCAP 83 06/21/2019   HGBA1C 9.3 (H) 05/09/2019    Review of Glycemic Control  Diabetes history: DM1 Outpatient Diabetes medications: Lantus 15 units qd + Novolog 8-11 units tid meal coverage Current orders for Inpatient glycemic control: Lantus 12 units + Novolog sensitive correction tid + hsd 0-5 units  Inpatient Diabetes Program Recommendations:   Patient's last visit noted patient had office visit with Dr. Dorris Fetch 03/20/19 although patient encouraged to follow up post last hospitalization. Last hospital visit, patient had hypoglycemia without insulin. Consider: -Decrease Lantus to 6 units daily  Will follow during hospitalization.  Thank you, Nani Gasser. Anel Purohit, RN, MSN, CDE  Diabetes Coordinator Inpatient Glycemic Control Team Team Pager [phone] (8am-5pm) 06/21/2019 1:30 PM

## 2019-06-22 LAB — CBC
HCT: 28.6 % — ABNORMAL LOW (ref 36.0–46.0)
Hemoglobin: 8.8 g/dL — ABNORMAL LOW (ref 12.0–15.0)
MCH: 26.5 pg (ref 26.0–34.0)
MCHC: 30.8 g/dL (ref 30.0–36.0)
MCV: 86.1 fL (ref 80.0–100.0)
Platelets: 405 10*3/uL — ABNORMAL HIGH (ref 150–400)
RBC: 3.32 MIL/uL — ABNORMAL LOW (ref 3.87–5.11)
RDW: 14.6 % (ref 11.5–15.5)
WBC: 10.1 10*3/uL (ref 4.0–10.5)
nRBC: 0 % (ref 0.0–0.2)

## 2019-06-22 LAB — GLUCOSE, CAPILLARY
Glucose-Capillary: 212 mg/dL — ABNORMAL HIGH (ref 70–99)
Glucose-Capillary: 100 mg/dL — ABNORMAL HIGH (ref 70–99)
Glucose-Capillary: 79 mg/dL (ref 70–99)
Glucose-Capillary: 63 mg/dL — ABNORMAL LOW (ref 70–99)
Glucose-Capillary: 89 mg/dL (ref 70–99)

## 2019-06-22 LAB — COMPREHENSIVE METABOLIC PANEL
ALT: 11 U/L (ref 0–44)
AST: 12 U/L — ABNORMAL LOW (ref 15–41)
Albumin: 1.5 g/dL — ABNORMAL LOW (ref 3.5–5.0)
Alkaline Phosphatase: 201 U/L — ABNORMAL HIGH (ref 38–126)
Anion gap: 10 (ref 5–15)
BUN: 24 mg/dL — ABNORMAL HIGH (ref 6–20)
CO2: 20 mmol/L — ABNORMAL LOW (ref 22–32)
Calcium: 8.5 mg/dL — ABNORMAL LOW (ref 8.9–10.3)
Chloride: 109 mmol/L (ref 98–111)
Creatinine, Ser: 2.5 mg/dL — ABNORMAL HIGH (ref 0.44–1.00)
GFR calc Af Amer: 30 mL/min — ABNORMAL LOW (ref >60)
GFR calc non Af Amer: 26 mL/min — ABNORMAL LOW (ref >60)
Glucose, Bld: 37 mg/dL — CL (ref 70–99)
Potassium: 4.2 mmol/L (ref 3.5–5.1)
Sodium: 139 mmol/L (ref 135–145)
Total Bilirubin: 0.4 mg/dL (ref 0.3–1.2)
Total Protein: 6.4 g/dL — ABNORMAL LOW (ref 6.5–8.1)

## 2019-06-22 LAB — MAGNESIUM: Magnesium: 1.6 mg/dL — ABNORMAL LOW (ref 1.7–2.4)

## 2019-06-22 MED ORDER — MAGNESIUM SULFATE 2 GM/50ML IV SOLN
2.0000 g | Freq: Once | INTRAVENOUS | Status: AC
  Administered 2019-06-22: 2 g via INTRAVENOUS
  Filled 2019-06-22: qty 50

## 2019-06-22 MED ORDER — DEXTROSE 50 % IV SOLN
INTRAVENOUS | Status: AC
  Administered 2019-06-22: 50 mL
  Filled 2019-06-22: qty 50

## 2019-06-22 NOTE — Progress Notes (Signed)
Patient ID: Lindsey Murillo, female   DOB: [birth year], 24 y.o.   MRN: [id number]          [facility] for Infectious Disease    Date of Admission:  06/20/2019   Total days of antibiotics 17        Day 2 ertapenem  [name] remains afebrile.  Her creatinine is trending down slowly.  I recommend continuing ertapenem to complete therapy for her right buttock cellulitis, sacral osteomyelitis and diabetic foot infection.  Plan: 1. Continue ertapenem 2. I will sign off now and arrange follow-up in my clinic  Diagnosis: Sacral osteomyelitis and diabetic foot infection  Culture Result: Polymicrobial  No Known Allergies  OPAT Orders Discharge antibiotics: Per pharmacy protocol ertapenem  Duration: 6 weeks End Date: 07/17/2019  [facility] Per Protocol:  Home health RN for IV administration and teaching; PICC line care and labs.    Labs weekly while on IV antibiotics: _x_ CBC with differential _x_ BMP __ CMP __ CRP __ ESR __ Vancomycin trough __ CK  _x_ Please pull PIC at completion of IV antibiotics __ Please leave PIC in place until doctor has seen patient or been notified  Fax weekly labs to [phone]  Clinic Follow Up Appt: 07/17/2019  Michel Bickers, [place] [facility] [place] [phone] pager   [phone] cell 06/22/2019, 11:18 AM

## 2019-06-22 NOTE — Progress Notes (Signed)
PROGRESS NOTE    Lindsey Murillo  [id number] DOB: 05-17-1995 DOA: 06/20/2019 PCP: Mikey Kirschner, MD   Brief Narrative:  24 year old with history of diabetic autonomic/peripheral neuropathy, asthma, hypertension, medical noncompliance, acanthosis nigricans recently admitted for sacral osteomyelitis discharged on vancomycin and Rocephin with last dose 07/18/2019.  Comes back to the hospital with complaints of diarrhea and bilateral lower extremity swelling.  Found to be in acute kidney injury upon admission.  Infectious disease recommended transitioning antibiotics to IV ertapenem to complete her course.   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  Creatinine today is 2.5. -UA-negative.  Renal ultrasound-negative -Continue IV fluids.  Hopeful recovery.  Advised nursing staff to closely monitor input and output.  Bilateral lower extremity swelling -Unclear etiology.  Lower extremity Dopplers-negative.  Sacral osteomyelitis -Spoke with infectious disease, recommend transitioning to ertapenem.  Last dose will be 07/18/2019.  Upon discharge patient will need resumption/order for home health services for this.  Hypomagnesemia -Repletion.  Diabetes mellitus type 1, hypoglycemic episode due to poor oral intake Peripheral neuropathy/autonomic dysfunction -Discontinue Lantus.  Insulin sliding scale and Accu-Chek for now.  Chronic  normocytic anemia -Baseline hemoglobin.  8.8  Essential hypertension -Continue home meds-metoprolol  DVT prophylaxis: Subcutaneous heparin Code Status: Full code Family Communication:   Disposition Plan:   Patient From= home  Patient Anticipated D/C place= Home  Barriers= maintain hospital stay until renal function improves.  Patient is still currently oligoanuric in the setting of vancomycin induced nephrotoxicity/ATN.  Currently she is unsafe for discharge, requires IV fluids.   Subjective: Overall feels tired but no different than yesterday.  Minimal urine output in last 24 hours.  Tells me it is not as dark as it used to be.  Review of Systems Otherwise negative except as per HPI, including: General = no fevers, chills, dizziness,  fatigue HEENT/EYES = negative for loss of vision, double vision, blurred vision,  sore throa Cardiovascular= negative for chest pain, palpitation Respiratory/lungs= negative for shortness of breath, cough, wheezing; hemoptysis,  Gastrointestinal= negative for nausea, vomiting, abdominal pain Genitourinary= negative for Dysuria MSK = Negative for arthralgia, myalgias Neurology= Negative for headache, numbness, tingling  Psychiatry= Negative for suicidal and homocidal ideation Skin= Negative for Rash  Examination:  Constitutional: Not in acute distress Respiratory: Clear to auscultation bilaterally Cardiovascular: Normal sinus rhythm, no rubs Abdomen: Nontender nondistended good bowel sounds Musculoskeletal: No edema noted Skin: No rashes seen Neurologic: CN 2-12 grossly intact.  And nonfocal Psychiatric: Normal judgment and insight. Alert and oriented x 3. Normal mood.  Right upper extremity PICC line.  Objective: Vitals:   06/21/19 1527 06/21/19 2149 06/22/19 0600 06/22/19 0900  BP:  135/90 (!) 164/105   Pulse:  88 95   Resp:  17 15   Temp:  98.6 F (37 C) 98.4 F (36.9 C)   TempSrc:  Oral Oral   SpO2: 100% 98% 98% 98%  Weight:       Height:        Intake/Output Summary (Last 24 hours)  at 06/22/2019 1254 Last data filed at 06/22/2019 1100 Gross per 24 hour  Intake 1813.97 ml  Output 750 ml  Net 1063.97 ml   Filed Weights   06/20/19 1639 06/20/19 2100  Weight: 59 kg 57.7 kg     Data Reviewed:   CBC: Recent Labs  Lab 06/20/19 1710 06/21/19 0402 06/22/19 0406  WBC 17.5* 14.1* 10.1  NEUTROABS 14.1*  --   --   HGB 9.8* 8.8* 8.8*  HCT 31.8* 28.1* 28.6*  MCV 86.2 84.9 86.1  PLT 498* 396 [id number]*   Basic Metabolic Panel: Recent Labs  Lab 06/20/19 1710 06/21/19 0402 06/22/19 0406  NA 135 136 139  K 4.3 4.1 4.2  CL 104 111 109  CO2 19* 22 20*  GLUCOSE 109* 91 37*  BUN 26* 27* 24*  CREATININE 2.62* 2.57* 2.50*  CALCIUM 9.2 8.3* 8.5*  MG  --   --  1.6*   GFR: Estimated Creatinine Clearance: 31.6 mL/min (A) (by C-G formula based on SCr of 2.5 mg/dL (H)). Liver Function Tests: Recent Labs  Lab 06/20/19 1710 06/21/19 0402 06/22/19 0406  AST 13* 9* 12*  ALT 16 11 11   ALKPHOS 276* 187* 201*  BILITOT 0.6 0.6 0.4  PROT 8.2* 5.6* 6.4*  ALBUMIN 2.0* 1.4* 1.5*   No results for input(s): LIPASE, AMYLASE in the last 168 hours. No results for input(s): AMMONIA in the last 168 hours. Coagulation Profile: No results for input(s): INR, PROTIME in the last 168 hours. Cardiac Enzymes: No results for input(s): CKTOTAL, CKMB, CKMBINDEX, TROPONINI in the last 168 hours. BNP (last 3 results) No results for input(s): PROBNP in the last 8760 hours. HbA1C: No results for input(s): HGBA1C in the last 72 hours. CBG: Recent Labs  Lab 06/21/19 1625 06/21/19 2145 06/22/19 0551 06/22/19 0824 06/22/19 1200  GLUCAP 89 87 212* 100* 79   Lipid Profile: No results for input(s): CHOL, HDL, LDLCALC, TRIG, CHOLHDL, LDLDIRECT in the last 72 hours. Thyroid Function Tests: No results for input(s): TSH, T4TOTAL, FREET4, T3FREE, THYROIDAB in the last 72 hours. Anemia Panel: No results for input(s): VITAMINB12, FOLATE,  FERRITIN, TIBC, IRON, RETICCTPCT in the last 72 hours. Sepsis Labs: Recent Labs  Lab 06/20/19 1710  LATICACIDVEN 0.9    Recent Results (from the past 240 hour(s))  Respiratory Panel by RT PCR (Flu A&B, Covid) - Nasopharyngeal Swab     Status: None   Collection Time: 06/20/19  6:54 PM   Specimen: Nasopharyngeal Swab  Result Value Ref Range Status   SARS Coronavirus 2 by RT PCR NEGATIVE NEGATIVE Final    Comment: (NOTE) SARS-CoV-2 target nucleic acids are NOT DETECTED. The SARS-CoV-2 RNA is generally detectable in upper respiratoy specimens during the acute phase of infection. The lowest concentration of SARS-CoV-2 viral copies this assay can detect is 131 copies/mL. A negative result does not preclude SARS-Cov-2 infection and should not be used as the sole basis for treatment or other patient management decisions. A negative result may occur with  improper specimen collection/handling, submission of specimen other than nasopharyngeal swab, presence of viral mutation(s) within the areas targeted by this assay, and inadequate number of viral copies (<131 copies/mL). A negative result must be combined with clinical observations, patient history, and epidemiological information. The expected result is Negative. Fact Sheet for Patients:  PinkCheek.be Fact Sheet for Healthcare Providers:  GravelBags.it This test is not yet ap proved or cleared by the [place] FDA and  has been authorized for detection and/or diagnosis of SARS-CoV-2  by FDA under an Emergency Use Authorization (EUA). This EUA will remain  in effect (meaning this test can be used) for the duration of the COVID-19 declaration under Section 564(b)(1) of the Act, 21 U.S.C. section 360bbb-3(b)(1), unless the authorization is terminated or revoked sooner.    Influenza A by PCR NEGATIVE NEGATIVE Final   Influenza B by PCR NEGATIVE NEGATIVE Final    Comment:  (NOTE) The Xpert Xpress SARS-CoV-2/FLU/RSV assay is intended as an aid in  the diagnosis of influenza from Nasopharyngeal swab specimens and  should not be used as a sole basis for treatment. Nasal washings and  aspirates are unacceptable for Xpert Xpress SARS-CoV-2/FLU/RSV  testing. Fact Sheet for Patients: PinkCheek.be Fact Sheet for Healthcare Providers: GravelBags.it This test is not yet approved or cleared by the [place] FDA and  has been authorized for detection and/or diagnosis of SARS-CoV-2 by  FDA under an Emergency Use Authorization (EUA). This EUA will remain  in effect (meaning this test can be used) for the duration of the  Covid-19 declaration under Section 564(b)(1) of the Act, 21  U.S.C. section 360bbb-3(b)(1), unless the authorization is  terminated or revoked. Performed at [facility], [address] [place], [place] [address]          Radiology Studies: US RENAL  Result Date: 06/21/2019 CLINICAL DATA:  Acute kidney injury. EXAM: RENAL / URINARY TRACT ULTRASOUND COMPLETE COMPARISON:  Sep 07, 2012. FINDINGS: Right Kidney: Renal measurements: 11.7 x 6.4 x 5.7 cm = volume: 227 mL. Increased echogenicity of renal parenchyma is noted suggesting medical renal disease. No mass or hydronephrosis visualized. Left Kidney: Renal measurements: 11.7 x 6.5 x 6.6 cm = volume: 266 mL. Increased echogenicity of renal parenchyma is noted suggesting medical renal disease. No mass or hydronephrosis visualized. Bladder: Appears normal for degree of bladder distention. Left ureteral jet is noted. Right ureteral jet is not visualized. Calculated prevoid volume of 112 mL. Other: None. IMPRESSION: Increased echogenicity of renal parenchyma is noted bilaterally suggesting medical renal disease. No hydronephrosis or renal obstruction is noted. Electronically Signed   By: Marijo Conception M.D.   On: 06/21/2019 11:47   US Venous Img  Lower Bilateral (DVT)  Result Date: 06/21/2019 CLINICAL DATA:  Bilateral lower extremity edema. EXAM: BILATERAL LOWER EXTREMITY VENOUS DOPPLER ULTRASOUND TECHNIQUE: Gray-scale sonography with graded compression, as well as color Doppler and duplex ultrasound were performed to evaluate the lower extremity deep venous systems from the level of the common femoral vein and including the common femoral, femoral, profunda femoral, popliteal and calf veins including the posterior tibial, peroneal and gastrocnemius veins when visible. The superficial great saphenous vein was also interrogated. Spectral Doppler was utilized to evaluate flow at rest and with distal augmentation maneuvers in the common femoral, femoral and popliteal veins. COMPARISON:  None. FINDINGS: RIGHT LOWER EXTREMITY Common Femoral Vein: No evidence of thrombus. Normal compressibility, respiratory phasicity and response to augmentation. Saphenofemoral Junction: No evidence of thrombus. Normal compressibility and flow on color Doppler imaging. Profunda Femoral Vein: No evidence of thrombus. Normal compressibility and flow on color Doppler imaging. Femoral Vein: No evidence of thrombus. Normal compressibility, respiratory phasicity and response to augmentation. Popliteal Vein: No evidence of thrombus. Normal compressibility, respiratory phasicity and response to augmentation. Calf Veins: No evidence of thrombus. Normal compressibility and flow on color Doppler imaging. Superficial Great Saphenous Vein: No evidence of thrombus. Normal compressibility. Venous Reflux:  None. Other Findings: No evidence of superficial thrombophlebitis or abnormal fluid collection. Mildly prominent  right inguinal lymph nodes with the largest measuring approximately 1.3 cm in short axis. These are likely reactive nodes. LEFT LOWER EXTREMITY Common Femoral Vein: No evidence of thrombus. Normal compressibility, respiratory phasicity and response to augmentation. Saphenofemoral  Junction: No evidence of thrombus. Normal compressibility and flow on color Doppler imaging. Profunda Femoral Vein: No evidence of thrombus. Normal compressibility and flow on color Doppler imaging. Femoral Vein: No evidence of thrombus. Normal compressibility, respiratory phasicity and response to augmentation. Popliteal Vein: No evidence of thrombus. Normal compressibility, respiratory phasicity and response to augmentation. Calf Veins: No evidence of thrombus. Normal compressibility and flow on color Doppler imaging. Superficial Great Saphenous Vein: No evidence of thrombus. Normal compressibility. Venous Reflux:  None. Other Findings: No evidence of superficial thrombophlebitis or abnormal fluid collection. IMPRESSION: No evidence of deep venous thrombosis in either lower extremity. Mildly prominent right inguinal lymph nodes. These are favored to be likely reactive lymph nodes. Electronically Signed   By: Aletta Edouard M.D.   On: 06/21/2019 12:10   DG Chest Portable 1 View  Result Date: 06/20/2019 CLINICAL DATA:  Picc line placement. Pt denies any on body medical injector devices. EXAM: PORTABLE CHEST 1 VIEW COMPARISON:  Chest radiograph 04/15/2017 FINDINGS: Interval placement of a right upper extremity PICC line with tip projecting over the distal SVC. The heart size and mediastinal contours are within normal limits given AP technique. The lungs are clear. No pneumothorax or large pleural effusion. No acute finding in the visualized skeleton. IMPRESSION: Right upper extremity PICC line with tip projecting over the distal SVC. Electronically Signed   By: Audie Pinto M.D.   On: 06/20/2019 18:09        Scheduled Meds: . Chlorhexidine Gluconate Cloth  6 each Topical Daily  . heparin injection (subcutaneous)  5,000 Units Subcutaneous Q8H  . insulin aspart  0-5 Units Subcutaneous QHS  . insulin aspart  0-9 Units Subcutaneous TID WC  . metoprolol tartrate  25 mg Oral BID   Continuous  Infusions: . sodium chloride 100 mL/hr at 06/22/19 1100  . ertapenem Stopped (06/21/19 1530)     LOS: 2 days   Time spent= 35 mins    Javaughn Opdahl Arsenio Loader, MD [facility]  If 7PM-7AM, please contact night-coverage  06/22/2019, 12:54 PM

## 2019-06-22 NOTE — Progress Notes (Signed)
2230- MD paged r/t pt's CBG=87 and had 6u of lantus insulin due. Gave insulin as ordered per MD.  [id number]- critical value called, glucose= 37. MD notified, IV dextrose given per MD and hypoglycemia protocol.  [id number]- CBG=212

## 2019-06-22 NOTE — Progress Notes (Signed)
PHARMACY CONSULT NOTE FOR:  OUTPATIENT  PARENTERAL ANTIBIOTIC THERAPY (OPAT)  Indication: Sacral osteomyelitis and diabetic foot infection Regimen: Ertapenem 1000 mg IV every 24 hours if CrCl stays above 30 mL/min. End date: 07/17/19  IV antibiotic discharge orders are pended. To discharging provider:  please sign these orders via discharge navigator,  Select New Orders & click on the button choice - Manage This Unsigned Work.     Thank you for allowing pharmacy to be a part of this patient's care.  Ramond Craver 06/22/2019, 12:21 PM

## 2019-06-23 LAB — GLUCOSE, CAPILLARY
Glucose-Capillary: 110 mg/dL — ABNORMAL HIGH (ref 70–99)
Glucose-Capillary: 121 mg/dL — ABNORMAL HIGH (ref 70–99)
Glucose-Capillary: 72 mg/dL (ref 70–99)
Glucose-Capillary: 77 mg/dL (ref 70–99)
Glucose-Capillary: 91 mg/dL (ref 70–99)

## 2019-06-23 LAB — CBC
HCT: 26 % — ABNORMAL LOW (ref 36.0–46.0)
Hemoglobin: 8 g/dL — ABNORMAL LOW (ref 12.0–15.0)
MCH: 26.8 pg (ref 26.0–34.0)
MCHC: 30.8 g/dL (ref 30.0–36.0)
MCV: 87 fL (ref 80.0–100.0)
Platelets: 357 10*3/uL (ref 150–400)
RBC: 2.99 MIL/uL — ABNORMAL LOW (ref 3.87–5.11)
RDW: 14.7 % (ref 11.5–15.5)
WBC: 8.9 10*3/uL (ref 4.0–10.5)
nRBC: 0 % (ref 0.0–0.2)

## 2019-06-23 LAB — FERRITIN: Ferritin: 133 ng/mL (ref 11–307)

## 2019-06-23 LAB — COMPREHENSIVE METABOLIC PANEL
ALT: 10 U/L (ref 0–44)
AST: 11 U/L — ABNORMAL LOW (ref 15–41)
Albumin: 1.4 g/dL — ABNORMAL LOW (ref 3.5–5.0)
Alkaline Phosphatase: 202 U/L — ABNORMAL HIGH (ref 38–126)
Anion gap: 10 (ref 5–15)
BUN: 23 mg/dL — ABNORMAL HIGH (ref 6–20)
CO2: 20 mmol/L — ABNORMAL LOW (ref 22–32)
Calcium: 8.5 mg/dL — ABNORMAL LOW (ref 8.9–10.3)
Chloride: 109 mmol/L (ref 98–111)
Creatinine, Ser: 2.6 mg/dL — ABNORMAL HIGH (ref 0.44–1.00)
GFR calc Af Amer: 29 mL/min — ABNORMAL LOW (ref >60)
GFR calc non Af Amer: 25 mL/min — ABNORMAL LOW (ref >60)
Glucose, Bld: 86 mg/dL (ref 70–99)
Potassium: 4.1 mmol/L (ref 3.5–5.1)
Sodium: 139 mmol/L (ref 135–145)
Total Bilirubin: 0.5 mg/dL (ref 0.3–1.2)
Total Protein: 5.9 g/dL — ABNORMAL LOW (ref 6.5–8.1)

## 2019-06-23 LAB — RETICULOCYTES
Immature Retic Fract: 3 % (ref 2.3–15.9)
RBC.: 3.04 MIL/uL — ABNORMAL LOW (ref 3.87–5.11)
Retic Count, Absolute: 22.5 10*3/uL (ref 19.0–186.0)
Retic Ct Pct: 0.7 % (ref 0.4–3.1)

## 2019-06-23 LAB — IRON AND TIBC
Iron: 51 ug/dL (ref 28–170)
Saturation Ratios: 26 % (ref 10.4–31.8)
TIBC: 200 ug/dL — ABNORMAL LOW (ref 250–450)
UIBC: 149 ug/dL

## 2019-06-23 LAB — VITAMIN B12: Vitamin B-12: 982 pg/mL — ABNORMAL HIGH (ref 180–914)

## 2019-06-23 LAB — CREATININE, URINE, RANDOM: Creatinine, Urine: 50.39 mg/dL

## 2019-06-23 LAB — FOLATE: Folate: 4.3 ng/mL — ABNORMAL LOW (ref >5.9)

## 2019-06-23 LAB — MAGNESIUM: Magnesium: 1.9 mg/dL (ref 1.7–2.4)

## 2019-06-23 LAB — SODIUM, URINE, RANDOM: Sodium, Ur: 85 mmol/L

## 2019-06-23 MED ORDER — METOPROLOL TARTRATE 25 MG PO TABS
37.5000 mg | ORAL_TABLET | Freq: Two times a day (BID) | ORAL | Status: DC
  Administered 2019-06-23 – 2019-06-29 (×12): 37.5 mg via ORAL
  Filled 2019-06-23 (×13): qty 2

## 2019-06-23 NOTE — Progress Notes (Signed)
Pt states she has not voided since yesterday. Bladder scan completed, shows 800 cc.  Pt states she doesn't feel like urinating.  Pt told By RN we would need to do and I/o if no urine. Pt states she will try to urinate on her own in the next few mins. This RN will rescan pt after urination. MD notified.

## 2019-06-23 NOTE — Plan of Care (Signed)
Plan of care reviewed with patient.

## 2019-06-23 NOTE — Progress Notes (Signed)
PROGRESS NOTE    Lindsey Murillo  [id number] DOB: 04-14-1996 DOA: 06/20/2019 PCP: Mikey Kirschner, MD   Brief Narrative:  24 year old with history of diabetic autonomic/peripheral neuropathy, asthma, hypertension, medical noncompliance, acanthosis nigricans recently admitted for sacral osteomyelitis discharged on vancomycin and Rocephin with last dose 07/18/2019.  Comes back to the hospital with complaints of diarrhea and bilateral lower extremity swelling.  Found to be in acute kidney injury upon admission.  Infectious disease recommended transitioning antibiotics to IV ertapenem to complete her course.  2/13: Patient states that she has been having some diarrhea overnight, but denies any other complaints.  States that she has been urinating, but does have some episodes of urinary retention is confirmed on bladder scan.  She is able to void, however.  She currently appears euvolemic and after discussion with nephrology, Dr. Posey Pronto, will plan to hold IV fluid for now and also increase metoprolol dose for better blood pressure control.  Urine electrolytes will be sent for further analysis and repeat renal panel to be monitored in a.m.  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  Creatinine today is 2.6. -Discussed with nephrology with urine electrolytes to be sent for further analysis -Hold further IV fluid as she is euvolemic -UA-negative.   Renal ultrasound-negative  Bilateral lower extremity swelling -Lower extremity Dopplers negative -Appears improved  Sacral osteomyelitis -Appreciate infectious disease recommendations with transition to ertapenem -Last dose 07/18/2019  Hypomagnesemia -Repleted  Diabetes mellitus type 1, hypoglycemic episode due to poor oral intake Peripheral neuropathy/autonomic dysfunction -Discontinue Lantus.  Insulin sliding scale and Accu-Chek for now -Appears to have brittle control overall  Chronic normocytic anemia -Hemoglobin levels are decreasing to 8.0 this morning -She was noted to require PRBC transfusion during recent admission -No overt bleeding noted, but will collect stool occult as well as anemia panel  Essential hypertension -Continue home meds-metoprolol with increased dosing   DVT prophylaxis: Heparin Code Status: Full Family Communication: Patient states that she will speak to family, none at bedside Disposition Plan: Continue to watch renal function.  DC IV fluid today and increase metoprolol dose.  Urine electrolytes sent.  Discussed briefly with nephrology who will follow peripherally.  We will plan to collect C. difficile sample if frequent liquid, bowel movements noted.   Consultants:   None  Procedures:   See imaging below  Antimicrobials:  Anti-infectives (From admission, onward)   Start     Dose/Rate Route Frequency Ordered Stop   06/21/19 1415  ertapenem (INVANZ) 1,000 mg in sodium chloride 0.9 % 100 mL IVPB     1 g 200 mL/hr over 30 Minutes Intravenous Every 24 hours 06/21/19 1401     06/21/19 0800  cefTRIAXone (ROCEPHIN) 2 g in sodium chloride 0.9 % 100 mL IVPB  Status:  Discontinued     2 g 200 mL/hr over 30 Minutes Intravenous Every 24 hours 06/20/19 2215 06/21/19 1401       Subjective: Patient seen and evaluated today with no new acute complaints or concerns. No acute  concerns or events noted overnight.  She complains of some frequent  bowel movements.  She states that she has not been voiding all that much.  Objective: Vitals:   06/22/19 1400 06/22/19 1557 06/22/19 2049 06/23/19 0506  BP: 138/82 (!) 154/101 (!) 156/106 (!) 145/95  Pulse: 90 96 97 89  Resp: 16 16 17 18   Temp: 98.3 F (36.8 C) 98.4 F (36.9 C) (!) 97.4 F (36.3 C) 98.6 F (37 C)  TempSrc: Oral Oral Oral   SpO2: 98% 97% 100% 99%  Weight:      Height:        Intake/Output Summary (Last 24 hours) at 06/23/2019 1203 Last data filed at 06/23/2019 1111 Gross per 24 hour  Intake 483 ml  Output 1000 ml  Net -517 ml   Filed Weights   06/20/19 1639 06/20/19 2100  Weight: 59 kg 57.7 kg    Examination:  General exam: Appears calm and comfortable  Respiratory system: Clear to auscultation. Respiratory effort normal. Cardiovascular system: S1 & S2 heard, RRR. No JVD, murmurs, rubs, gallops or clicks. No pedal edema. Gastrointestinal system: Abdomen is nondistended, soft and nontender. No organomegaly or masses felt. Normal bowel sounds heard. Central nervous system: Alert and oriented. No focal neurological deficits. Extremities: Symmetric 5 x 5 power. Skin: No rashes, lesions or ulcers Psychiatry: Flat affect    Data Reviewed: I have personally reviewed following labs and imaging studies  CBC: Recent Labs  Lab 06/20/19 1710 06/21/19 0402 06/22/19 0406 06/23/19 0606  WBC 17.5* 14.1* 10.1 8.9  NEUTROABS 14.1*  --   --   --   HGB 9.8* 8.8* 8.8* 8.0*  HCT 31.8* 28.1* 28.6* 26.0*  MCV 86.2 84.9 86.1 87.0  PLT 498* 396 405* [id number]   Basic Metabolic Panel: Recent Labs  Lab 06/20/19 1710 06/21/19 0402 06/22/19 0406 06/23/19 0606  NA 135 136 139 139  K 4.3 4.1 4.2 4.1  CL 104 111 109 109  CO2 19* 22 20* 20*  GLUCOSE 109* 91 37* 86  BUN 26* 27* 24* 23*  CREATININE 2.62* 2.57* 2.50* 2.60*  CALCIUM 9.2 8.3* 8.5* 8.5*  MG  --   --  1.6* 1.9   GFR: Estimated Creatinine Clearance: 30.4 mL/min (A) (by C-G formula based on SCr of 2.6 mg/dL  (H)). Liver Function Tests: Recent Labs  Lab 06/20/19 1710 06/21/19 0402 06/22/19 0406 06/23/19 0606  AST 13* 9* 12* 11*  ALT 16 11 11 10   ALKPHOS 276* 187* 201* 202*  BILITOT 0.6 0.6 0.4 0.5  PROT 8.2* 5.6* 6.4* 5.9*  ALBUMIN 2.0* 1.4* 1.5* 1.4*   No results for input(s): LIPASE, AMYLASE in the last 168 hours. No results for input(s): AMMONIA in the last 168 hours. Coagulation Profile: No results for input(s): INR, PROTIME in the last 168 hours. Cardiac Enzymes: No results for input(s): CKTOTAL, CKMB, CKMBINDEX, TROPONINI in the last 168 hours. BNP (last 3 results) No results for input(s): PROBNP in the last 8760 hours. HbA1C: No results for input(s): HGBA1C in the last 72 hours. CBG: Recent Labs  Lab 06/22/19 1635 06/22/19 2042 06/23/19 0043 06/23/19 0713 06/23/19 1106  GLUCAP 63* 89 72 91 77   Lipid Profile: No results for input(s): CHOL, HDL, LDLCALC, TRIG, CHOLHDL, LDLDIRECT in the last 72 hours. Thyroid Function Tests: No results for input(s): TSH, T4TOTAL, FREET4, T3FREE, THYROIDAB in the last 72 hours. Anemia Panel: No results for input(s): VITAMINB12, FOLATE, FERRITIN, TIBC, IRON, RETICCTPCT in the last 72 hours. Sepsis Labs:  Recent Labs  Lab 06/20/19 1710  LATICACIDVEN 0.9    Recent Results (from the past 240 hour(s))  Respiratory Panel by RT PCR (Flu A&B, Covid) - Nasopharyngeal Swab     Status: None   Collection Time: 06/20/19  6:54 PM   Specimen: Nasopharyngeal Swab  Result Value Ref Range Status   SARS Coronavirus 2 by RT PCR NEGATIVE NEGATIVE Final    Comment: (NOTE) SARS-CoV-2 target nucleic acids are NOT DETECTED. The SARS-CoV-2 RNA is generally detectable in upper respiratoy specimens during the acute phase of infection. The lowest concentration of SARS-CoV-2 viral copies this assay can detect is 131 copies/mL. A negative result does not preclude SARS-Cov-2 infection and should not be used as the sole basis for treatment or other patient  management decisions. A negative result may occur with  improper specimen collection/handling, submission of specimen other than nasopharyngeal swab, presence of viral mutation(s) within the areas targeted by this assay, and inadequate number of viral copies (<131 copies/mL). A negative result must be combined with clinical observations, patient history, and epidemiological information. The expected result is Negative. Fact Sheet for Patients:  PinkCheek.be Fact Sheet for Healthcare Providers:  GravelBags.it This test is not yet ap proved or cleared by the [place] FDA and  has been authorized for detection and/or diagnosis of SARS-CoV-2 by FDA under an Emergency Use Authorization (EUA). This EUA will remain  in effect (meaning this test can be used) for the duration of the COVID-19 declaration under Section 564(b)(1) of the Act, 21 U.S.C. section 360bbb-3(b)(1), unless the authorization is terminated or revoked sooner.    Influenza A by PCR NEGATIVE NEGATIVE Final   Influenza B by PCR NEGATIVE NEGATIVE Final    Comment: (NOTE) The Xpert Xpress SARS-CoV-2/FLU/RSV assay is intended as an aid in  the diagnosis of influenza from Nasopharyngeal swab specimens and  should not be used as a sole basis for treatment. Nasal washings and  aspirates are unacceptable for Xpert Xpress SARS-CoV-2/FLU/RSV  testing. Fact Sheet for Patients: PinkCheek.be Fact Sheet for Healthcare Providers: GravelBags.it This test is not yet approved or cleared by the [place] FDA and  has been authorized for detection and/or diagnosis of SARS-CoV-2 by  FDA under an Emergency Use Authorization (EUA). This EUA will remain  in effect (meaning this test can be used) for the duration of the  Covid-19 declaration under Section 564(b)(1) of the Act, 21  U.S.C. section 360bbb-3(b)(1), unless the  authorization is  terminated or revoked. Performed at [facility], [address], [place], [place] [address]          Radiology Studies: No results found.      Scheduled Meds: . Chlorhexidine Gluconate Cloth  6 each Topical Daily  . heparin injection (subcutaneous)  5,000 Units Subcutaneous Q8H  . insulin aspart  0-5 Units Subcutaneous QHS  . insulin aspart  0-9 Units Subcutaneous TID WC  . metoprolol tartrate  37.5 mg Oral BID   Continuous Infusions: . ertapenem 1,000 mg (06/22/19 1451)     LOS: 3 days    Time spent: 30 minutes    Sheanna Dail Darleen Crocker, DO [facility] Pager [phone]  If 7PM-7AM, please contact night-coverage [email or web address] Password [facility] 06/23/2019, 12:03 PM

## 2019-06-24 LAB — COMPREHENSIVE METABOLIC PANEL
ALT: 9 U/L (ref 0–44)
AST: 10 U/L — ABNORMAL LOW (ref 15–41)
Albumin: 1.4 g/dL — ABNORMAL LOW (ref 3.5–5.0)
Alkaline Phosphatase: 196 U/L — ABNORMAL HIGH (ref 38–126)
Anion gap: 8 (ref 5–15)
BUN: 21 mg/dL — ABNORMAL HIGH (ref 6–20)
CO2: 21 mmol/L — ABNORMAL LOW (ref 22–32)
Calcium: 8.5 mg/dL — ABNORMAL LOW (ref 8.9–10.3)
Chloride: 111 mmol/L (ref 98–111)
Creatinine, Ser: 2.54 mg/dL — ABNORMAL HIGH (ref 0.44–1.00)
GFR calc Af Amer: 30 mL/min — ABNORMAL LOW (ref >60)
GFR calc non Af Amer: 26 mL/min — ABNORMAL LOW (ref >60)
Glucose, Bld: 79 mg/dL (ref 70–99)
Potassium: 4.1 mmol/L (ref 3.5–5.1)
Sodium: 140 mmol/L (ref 135–145)
Total Bilirubin: 0.4 mg/dL (ref 0.3–1.2)
Total Protein: 5.8 g/dL — ABNORMAL LOW (ref 6.5–8.1)

## 2019-06-24 LAB — CBC
HCT: 26.2 % — ABNORMAL LOW (ref 36.0–46.0)
Hemoglobin: 7.9 g/dL — ABNORMAL LOW (ref 12.0–15.0)
MCH: 26.1 pg (ref 26.0–34.0)
MCHC: 30.2 g/dL (ref 30.0–36.0)
MCV: 86.5 fL (ref 80.0–100.0)
Platelets: 319 10*3/uL (ref 150–400)
RBC: 3.03 MIL/uL — ABNORMAL LOW (ref 3.87–5.11)
RDW: 14.6 % (ref 11.5–15.5)
WBC: 8.2 10*3/uL (ref 4.0–10.5)
nRBC: 0 % (ref 0.0–0.2)

## 2019-06-24 LAB — GLUCOSE, CAPILLARY
Glucose-Capillary: 68 mg/dL — ABNORMAL LOW (ref 70–99)
Glucose-Capillary: 86 mg/dL (ref 70–99)
Glucose-Capillary: 95 mg/dL (ref 70–99)
Glucose-Capillary: 102 mg/dL — ABNORMAL HIGH (ref 70–99)
Glucose-Capillary: 203 mg/dL — ABNORMAL HIGH (ref 70–99)

## 2019-06-24 LAB — MAGNESIUM: Magnesium: 1.8 mg/dL (ref 1.7–2.4)

## 2019-06-24 NOTE — Progress Notes (Signed)
PROGRESS NOTE    Lindsey Murillo  [id number] DOB: 10-17-1995 DOA: 06/20/2019 PCP: Mikey Kirschner, MD   Brief Narrative:  24 year old with history of diabetic autonomic/peripheral neuropathy, asthma, hypertension, medical noncompliance, acanthosis nigricans recently admitted for sacral osteomyelitis discharged on vancomycin and Rocephin with last dose 07/18/2019. Comes back to the hospital with complaints of diarrhea and bilateral lower extremity swelling. Found to be in acute kidney injury upon admission.Infectious disease recommended transitioning antibiotics to IV ertapenem to complete her course.  2/13: Patient states that she has been having some diarrhea overnight, but denies any other complaints.  States that she has been urinating, but does have some episodes of urinary retention is confirmed on bladder scan.  She is able to void, however.  She currently appears euvolemic and after discussion with nephrology, Dr. Posey Pronto, will plan to hold IV fluid for now and also increase metoprolol dose for better blood pressure control.  Urine electrolytes will be sent for further analysis and repeat renal panel to be monitored in a.m.  2/14: Patient denies any symptomatic complaints or concerns today.  She appears to be having good urine output.  Chart has been reviewed by nephrology with plans to continue the same for now and continue monitoring creatinine levels.  Her kidney injury clinical course appears to be at a plateau based on urine electrolyte analysis.  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 Creatinine today is 2.54. -Discussed with nephrology and will maintain current course -UA-negative. Renal ultrasound-negative  Bilateral lower extremity swelling -Lower extremity Dopplers negative -Appears improved  Sacral osteomyelitis -Appreciate infectious disease recommendations with transition to ertapenem -Last dose 07/18/2019  Hypomagnesemia -Repleted  Diabetes mellitus type 1, hypoglycemic episode due to poor oral intake Peripheral neuropathy/autonomic dysfunction -Discontinue Lantus. Insulin sliding scale and Accu-Chek for now -Appears to have brittle control overall  Chronic normocytic anemia -Hemoglobin levels currently stable -She was noted to require PRBC transfusion during recent admission -No overt bleeding noted, but will collect stool occult as well as anemia panel  Essential hypertension -Continue home meds-metoprolol with increased dosing on 2/13   DVT prophylaxis: Heparin Code Status: Full Family Communication: Patient states that she will speak to family, none at bedside Disposition Plan: Continue to watch renal function.    Appreciate nephrology recommendations.  Continue to monitor while on ertapenem.  Will consider DC by tomorrow and monitor renal panel by PCP.   Consultants:   None  Procedures:   See imaging below  Antimicrobials:  Anti-infectives (From admission, onward)   Start     Dose/Rate Route Frequency Ordered Stop   06/21/19 1415  ertapenem (INVANZ) 1,000 mg in sodium chloride 0.9 % 100 mL IVPB     1 g 200 mL/hr over 30 Minutes Intravenous Every 24 hours 06/21/19 1401     06/21/19 0800  cefTRIAXone (ROCEPHIN) 2 g in sodium chloride 0.9 % 100 mL IVPB  Status:  Discontinued     2 g 200 mL/hr over 30 Minutes Intravenous Every 24 hours 06/20/19 2215  06/21/19 1401       Subjective: Patient seen and evaluated today with no new acute complaints or concerns. No acute concerns or events noted overnight.  Objective: Vitals:   06/23/19 1401 06/23/19 2058 06/24/19 0502 06/24/19 1007  BP: (!) 152/102 (!) 158/108 129/83 (!) 145/107  Pulse: 86 92 96   Resp: 18 18 18    Temp:  98 F (36.7 C) 98.7 F (37.1 C)   TempSrc:  Oral Oral   SpO2: 98% 100% 100%   Weight:      Height:        Intake/Output Summary (Last 24 hours) at 06/24/2019 1236 Last data filed at 06/24/2019 1011 Gross per 24 hour  Intake 480 ml  Output 1200 ml  Net -720 ml   Filed Weights   06/20/19 1639 06/20/19 2100  Weight: 59 kg 57.7 kg    Examination:  General exam: Appears calm and comfortable  Respiratory system: Clear to auscultation. Respiratory effort normal. Cardiovascular system: S1 & S2 heard, RRR. No JVD, murmurs, rubs, gallops or clicks. No pedal edema. Gastrointestinal system: Abdomen is nondistended, soft and nontender. No organomegaly or masses felt. Normal bowel sounds heard. Central nervous system: Alert and oriented. No focal neurological deficits. Extremities: Symmetric 5 x 5 power. Skin: No rashes, lesions or ulcers Psychiatry: Judgement and insight appear normal. Mood & affect appropriate.     Data Reviewed: I have personally reviewed following labs and imaging studies  CBC: Recent Labs  Lab 06/20/19 1710 06/21/19 0402 06/22/19 0406 06/23/19 0606 06/24/19 0635  WBC 17.5* 14.1* 10.1 8.9 8.2  NEUTROABS 14.1*  --   --   --   --   HGB 9.8* 8.8* 8.8* 8.0* 7.9*  HCT 31.8* 28.1* 28.6* 26.0* 26.2*  MCV 86.2 84.9 86.1 87.0 86.5  PLT 498* 396 405* 357 [id number]   Basic Metabolic Panel: Recent Labs  Lab 06/20/19 1710 06/21/19 0402 06/22/19 0406 06/23/19 0606 06/24/19 0635  NA 135 136 139 139 140  K 4.3 4.1 4.2 4.1 4.1  CL 104 111 109 109 111  CO2 19* 22 20* 20* 21*  GLUCOSE 109* 91 37* 86 79  BUN 26* 27* 24* 23* 21*  CREATININE  2.62* 2.57* 2.50* 2.60* 2.54*  CALCIUM 9.2 8.3* 8.5* 8.5* 8.5*  MG  --   --  1.6* 1.9 1.8   GFR: Estimated Creatinine Clearance: 31.1 mL/min (A) (by C-G formula based on SCr of 2.54 mg/dL (H)). Liver Function Tests: Recent Labs  Lab 06/20/19 1710 06/21/19 0402 06/22/19 0406 06/23/19 0606 06/24/19 0635  AST 13* 9* 12* 11* 10*  ALT 16 11 11 10 9   ALKPHOS 276* 187* 201* 202* 196*  BILITOT 0.6 0.6 0.4 0.5 0.4  PROT 8.2* 5.6* 6.4* 5.9* 5.8*  ALBUMIN 2.0* 1.4* 1.5* 1.4* 1.4*   No results for input(s): LIPASE, AMYLASE in the last 168 hours. No results for input(s): AMMONIA in the last 168 hours. Coagulation Profile: No results for input(s): INR, PROTIME in the last 168 hours. Cardiac Enzymes: No results for input(s): CKTOTAL, CKMB, CKMBINDEX, TROPONINI in the last 168 hours. BNP (last 3 results) No results for input(s): PROBNP in the last 8760 hours. HbA1C: No results for input(s): HGBA1C in the last 72 hours. CBG: Recent Labs  Lab 06/23/19 1612 06/23/19 2059 06/24/19 0754 06/24/19 1006 06/24/19 1151  GLUCAP 121* 110* 68* 86 95   Lipid Profile: No results for input(s): CHOL, HDL, LDLCALC, TRIG, CHOLHDL, LDLDIRECT in the last 72 hours. Thyroid Function Tests: No  results for input(s): TSH, T4TOTAL, FREET4, T3FREE, THYROIDAB in the last 72 hours. Anemia Panel: Recent Labs    06/23/19 0606 06/23/19 1304  VITAMINB12 982*  --   FOLATE  --  4.3*  FERRITIN 133  --   TIBC 200*  --   IRON 51  --   RETICCTPCT  --  0.7   Sepsis Labs: Recent Labs  Lab 06/20/19 1710  LATICACIDVEN 0.9    Recent Results (from the past 240 hour(s))  Respiratory Panel by RT PCR (Flu A&B, Covid) - Nasopharyngeal Swab     Status: None   Collection Time: 06/20/19  6:54 PM   Specimen: Nasopharyngeal Swab  Result Value Ref Range Status   SARS Coronavirus 2 by RT PCR NEGATIVE NEGATIVE Final    Comment: (NOTE) SARS-CoV-2 target nucleic acids are NOT DETECTED. The SARS-CoV-2 RNA is generally  detectable in upper respiratoy specimens during the acute phase of infection. The lowest concentration of SARS-CoV-2 viral copies this assay can detect is 131 copies/mL. A negative result does not preclude SARS-Cov-2 infection and should not be used as the sole basis for treatment or other patient management decisions. A negative result may occur with  improper specimen collection/handling, submission of specimen other than nasopharyngeal swab, presence of viral mutation(s) within the areas targeted by this assay, and inadequate number of viral copies (<131 copies/mL). A negative result must be combined with clinical observations, patient history, and epidemiological information. The expected result is Negative. Fact Sheet for Patients:  PinkCheek.be Fact Sheet for Healthcare Providers:  GravelBags.it This test is not yet ap proved or cleared by the [place] FDA and  has been authorized for detection and/or diagnosis of SARS-CoV-2 by FDA under an Emergency Use Authorization (EUA). This EUA will remain  in effect (meaning this test can be used) for the duration of the COVID-19 declaration under Section 564(b)(1) of the Act, 21 U.S.C. section 360bbb-3(b)(1), unless the authorization is terminated or revoked sooner.    Influenza A by PCR NEGATIVE NEGATIVE Final   Influenza B by PCR NEGATIVE NEGATIVE Final    Comment: (NOTE) The Xpert Xpress SARS-CoV-2/FLU/RSV assay is intended as an aid in  the diagnosis of influenza from Nasopharyngeal swab specimens and  should not be used as a sole basis for treatment. Nasal washings and  aspirates are unacceptable for Xpert Xpress SARS-CoV-2/FLU/RSV  testing. Fact Sheet for Patients: PinkCheek.be Fact Sheet for Healthcare Providers: GravelBags.it This test is not yet approved or cleared by the [place] FDA and  has been  authorized for detection and/or diagnosis of SARS-CoV-2 by  FDA under an Emergency Use Authorization (EUA). This EUA will remain  in effect (meaning this test can be used) for the duration of the  Covid-19 declaration under Section 564(b)(1) of the Act, 21  U.S.C. section 360bbb-3(b)(1), unless the authorization is  terminated or revoked. Performed at [facility], [address], [place], [place] [address]          Radiology Studies: No results found.      Scheduled Meds: . Chlorhexidine Gluconate Cloth  6 each Topical Daily  . heparin injection (subcutaneous)  5,000 Units Subcutaneous Q8H  . insulin aspart  0-5 Units Subcutaneous QHS  . insulin aspart  0-9 Units Subcutaneous TID WC  . metoprolol tartrate  37.5 mg Oral BID   Continuous Infusions: . ertapenem 1,000 mg (06/23/19 1459)     LOS: 4 days    Time spent: 30 minutes    Fritzi Scripter Darleen Crocker,  DO [facility] Pager [phone]  If 7PM-7AM, please contact night-coverage [email or web address] Password [facility] 06/24/2019, 12:36 PM

## 2019-06-24 NOTE — Plan of Care (Signed)
  Problem: Education: Goal: Knowledge of General Education information will improve Description: Including pain rating scale, medication(s)/side effects and non-pharmacologic comfort measures Outcome: Progressing   Problem: Health Behavior/Discharge Planning: Goal: Ability to manage health-related needs will improve Outcome: Progressing   Problem: Clinical Measurements: Goal: Will remain free from infection Outcome: Progressing   Problem: Nutrition: Goal: Adequate nutrition will be maintained Outcome: Progressing   Problem: Safety: Goal: Ability to remain free from injury will improve Outcome: Progressing   Problem: Skin Integrity: Goal: Risk for impaired skin integrity will decrease Outcome: Progressing   

## 2019-06-25 LAB — BASIC METABOLIC PANEL
Anion gap: 7 (ref 5–15)
BUN: 21 mg/dL — ABNORMAL HIGH (ref 6–20)
CO2: 21 mmol/L — ABNORMAL LOW (ref 22–32)
Calcium: 8.5 mg/dL — ABNORMAL LOW (ref 8.9–10.3)
Chloride: 111 mmol/L (ref 98–111)
Creatinine, Ser: 2.85 mg/dL — ABNORMAL HIGH (ref 0.44–1.00)
GFR calc Af Amer: 26 mL/min — ABNORMAL LOW (ref >60)
GFR calc non Af Amer: 22 mL/min — ABNORMAL LOW (ref >60)
Glucose, Bld: 83 mg/dL (ref 70–99)
Potassium: 4 mmol/L (ref 3.5–5.1)
Sodium: 139 mmol/L (ref 135–145)

## 2019-06-25 LAB — CBC
HCT: 25.7 % — ABNORMAL LOW (ref 36.0–46.0)
Hemoglobin: 8.1 g/dL — ABNORMAL LOW (ref 12.0–15.0)
MCH: 27.1 pg (ref 26.0–34.0)
MCHC: 31.5 g/dL (ref 30.0–36.0)
MCV: 86 fL (ref 80.0–100.0)
Platelets: 317 10*3/uL (ref 150–400)
RBC: 2.99 MIL/uL — ABNORMAL LOW (ref 3.87–5.11)
RDW: 14.7 % (ref 11.5–15.5)
WBC: 8.8 10*3/uL (ref 4.0–10.5)
nRBC: 0 % (ref 0.0–0.2)

## 2019-06-25 LAB — VANCOMYCIN, RANDOM: Vancomycin Rm: 24

## 2019-06-25 LAB — GLUCOSE, CAPILLARY
Glucose-Capillary: 72 mg/dL (ref 70–99)
Glucose-Capillary: 106 mg/dL — ABNORMAL HIGH (ref 70–99)
Glucose-Capillary: 125 mg/dL — ABNORMAL HIGH (ref 70–99)

## 2019-06-25 LAB — MAGNESIUM: Magnesium: 1.7 mg/dL (ref 1.7–2.4)

## 2019-06-25 LAB — OCCULT BLOOD X 1 CARD TO LAB, STOOL: Fecal Occult Bld: POSITIVE — AB

## 2019-06-25 MED ORDER — SODIUM CHLORIDE 0.9 % IV SOLN
500.0000 mg | INTRAVENOUS | Status: DC
  Administered 2019-06-25: 500 mg via INTRAVENOUS
  Filled 2019-06-25 (×2): qty 0.5

## 2019-06-25 MED ORDER — HEPARIN SODIUM (PORCINE) 5000 UNIT/ML IJ SOLN
5000.0000 [IU] | Freq: Three times a day (TID) | INTRAMUSCULAR | Status: DC
  Administered 2019-06-25 – 2019-06-26 (×3): 5000 [IU] via SUBCUTANEOUS
  Filled 2019-06-25 (×3): qty 1

## 2019-06-25 MED ORDER — LABETALOL HCL 5 MG/ML IV SOLN
10.0000 mg | INTRAVENOUS | Status: DC | PRN
  Administered 2019-06-25: 10 mg via INTRAVENOUS
  Filled 2019-06-25 (×2): qty 4

## 2019-06-25 MED ORDER — SODIUM CHLORIDE 0.9 % IV SOLN: INTRAVENOUS | Status: AC

## 2019-06-25 MED ORDER — LOPERAMIDE HCL 2 MG PO CAPS
2.0000 mg | ORAL_CAPSULE | ORAL | Status: DC | PRN
  Administered 2019-06-25 – 2019-06-26 (×2): 2 mg via ORAL
  Filled 2019-06-25 (×2): qty 1

## 2019-06-25 NOTE — Consult Note (Signed)
[facility] Renal Consultation Note  Requesting MD: Heath Lark, DO Indication for Consultation: AKI  Chief complaint: Diarrhea and swelling HPI:  Lindsey Murillo is a 24 y.o. female with a history of hypertension, diabetes, medical noncompliance and recent diagnosis of sacral osteomyelitis who came to the hospital with diarrhea and swelling.  She was found to have AKI and her Vanc level was 62 (reported as random check on 2/10 at 17:10) .  Last dose of Vanco was on 2/10 per chart review however the patient states that it was actually on 2/8 that she did not have a dose on 2/9 or 2/10 creatinine trends as below; creatinine had plateaued at about 2 6 and now is up to 2.85.  Nephrology is consulted for assistance with management.  She had been hydrated per primary team.  Baseline Cr around 0.6.  She denies any known family history of CKD.  She denies Advil use at home.  States that she has been urinating without difficulty here and nursing reports they just emptied 700 ml of urine.  She provides limited additional history and answers "I don't know" to many questions.  Found to be heme positive and with blood per rectum here and she states that she does not know how long that has been going on.  Creatinine  Date/Time Value Ref Range Status  12/13/2018 12:00 AM 0.8 0.5 - 1.1 Final   Creat  Date/Time Value Ref Range Status  11/05/2015 12:52 PM 0.47 (L) 0.50 - 1.10 mg/dL Final  03/13/2013 05:25 PM 0.66 0.50 - 1.10 mg/dL Final  11/28/2012 09:40 AM 0.66 0.50 - 1.10 mg/dL Final  07/20/2012 10:01 AM 0.76 0.10 - 1.20 mg/dL Final  08/26/2011 11:48 AM 0.89 0.10 - 1.20 mg/dL Final   Creatinine, Ser  Date/Time Value Ref Range Status  06/25/2019 08:00 AM 2.85 (H) 0.44 - 1.00 mg/dL Final  06/24/2019 06:35 AM 2.54 (H) 0.44 - 1.00 mg/dL Final  06/23/2019 06:06 AM 2.60 (H) 0.44 - 1.00 mg/dL Final  06/22/2019 04:06 AM 2.50 (H) 0.44 - 1.00 mg/dL Final  06/21/2019 04:02 AM 2.57 (H) 0.44 -  1.00 mg/dL Final  06/20/2019 05:10 PM 2.62 (H) 0.44 - 1.00 mg/dL Final  06/12/2019 09:16 AM 0.52 0.44 - 1.00 mg/dL Final  06/11/2019 04:40 AM 0.57 0.44 - 1.00 mg/dL Final  06/10/2019 06:09 AM 0.62 0.44 - 1.00 mg/dL Final  06/09/2019 05:20 AM 0.85 0.44 - 1.00 mg/dL Final  06/08/2019 08:31 AM 0.76 0.44 - 1.00 mg/dL Final  06/07/2019 06:06 AM 0.80 0.44 - 1.00 mg/dL Final  06/06/2019 11:43 PM 0.99 0.44 - 1.00 mg/dL Final  04/13/2019 12:42 PM 0.71 0.44 - 1.00 mg/dL Final  05/27/2018 07:36 PM 0.59 0.44 - 1.00 mg/dL Final  04/12/2017 10:14 AM 0.45 0.44 - 1.00 mg/dL Final  04/10/2017 04:08 PM 0.45 0.44 - 1.00 mg/dL Final  03/26/2016 11:49 AM 0.53 0.44 - 1.00 mg/dL Final  11/03/2015 06:04 PM 0.56 0.44 - 1.00 mg/dL Final  08/07/2015 10:27 PM 0.66 0.44 - 1.00 mg/dL Final  01/07/2015 03:58 PM 0.56 0.44 - 1.00 mg/dL Final  08/13/2014 08:40 AM 0.71 0.50 - 1.10 mg/dL Final  09/03/2013 05:33 PM 0.49 (L) 0.50 - 1.10 mg/dL Final  09/07/2012 12:10 PM 0.49 (L) 0.50 - 1.10 mg/dL Final     PMHx:   Past Medical History:  Diagnosis Date  . Acanthosis nigricans, acquired   . Asthma   . Diabetic autonomic neuropathy ([place])   . Diabetic peripheral neuropathy ([place])   . Environmental  allergies   . Goiter   . Hypoglycemia associated with diabetes ([place])   . Tachycardia   . Thyroiditis, autoimmune   . Type 1 diabetes mellitus in patient age 61-19 years with HbA1C goal below 7.5     Past Surgical History:  Procedure Laterality Date  . BIOPSY  08/27/2016   Procedure: BIOPSY;  Surgeon: Danie Binder, MD;  Location: [facility];  Service: Endoscopy;;  duodenum; gastric  . COLONOSCOPY    . ESOPHAGOGASTRODUODENOSCOPY N/A 08/27/2016   Dr. Oneida Alar: mild gastritis. Negative celiac. No obvious source for dyspepsia/diarrhea    Family Hx:  Family History  Problem Relation Age of Onset  . Diabetes Mother        Type II DM  . Thyroid disease Mother   . Diabetes Maternal Grandmother        Type II DM  .  Diabetes Cousin        Type II DM  . Colon cancer Neg Hx   . Colon polyps Neg Hx     Social History:  reports that she has never smoked. She has never used smokeless tobacco. She reports that she does not drink alcohol or use drugs.  Allergies: No Known Allergies  Medications: Prior to Admission medications   Medication Sig Start Date End Date Taking? Authorizing Provider  acetaminophen (TYLENOL 8 HOUR ARTHRITIS PAIN) 650 MG CR tablet Take 650 mg by mouth every 8 (eight) hours as needed for pain.   Yes [provider]  bisacodyl (DULCOLAX) 10 MG suppository Place 1 suppository (10 mg total) rectally daily as needed for moderate constipation. 06/12/19  Yes Shah, Pratik D, DO  cefTRIAXone (ROCEPHIN) IVPB Inject 2 g into the vein daily. Indication:  Sacral osteomyelitis Last Day of Therapy:  07/18/19 Labs - Once weekly:  CBC/D and BMP, Labs - Every other week:  ESR and CRP 06/09/19  Yes Shah, Pratik D, DO  glucagon 1 MG injection Follow package directions for low blood sugar. Patient taking differently: Inject 1 mg into the skin once as needed. Follow package directions for low blood sugar. 01/05/12  Yes Sherrlyn Hock, MD  ibuprofen (ADVIL,MOTRIN) 200 MG tablet Take 600 mg by mouth daily as needed for mild pain or moderate pain.    Yes [provider]  insulin aspart (NOVOLOG FLEXPEN) 100 UNIT/ML FlexPen Inject 8-11 Units into the skin 3 (three) times daily with meals. 03/20/19  Yes Nida, Marella Chimes, MD  insulin glargine (LANTUS) 100 UNIT/ML injection Inject 0.15 mLs (15 Units total) into the skin at bedtime. 06/12/19  Yes Shah, Pratik D, DO  magnesium oxide (MAG-OX) 400 MG tablet Take 1 tablet (400 mg total) by mouth daily. 06/12/19 07/12/19 Yes Shah, Pratik D, DO  metoprolol tartrate (LOPRESSOR) 25 MG tablet Take 1 tablet (25 mg total) by mouth 2 (two) times daily. 06/12/19 07/12/19 Yes Shah, Pratik D, DO  traMADol (ULTRAM) 50 MG tablet Take 1 tablet (50 mg total) by mouth every  8 (eight) hours as needed for moderate pain or severe pain. 06/12/19  Yes Shah, Pratik D, DO  vancomycin IVPB Inject 750 mg into the vein every 12 (twelve) hours. Indication:  Sacral osteomyelitis Last Day of Therapy:  07/18/19 Labs - Sunday/Monday:  CBC/D, BMP, and vancomycin trough. Labs - Thursday:  BMP and vancomycin trough Labs - Every other week:  ESR and CRP 06/09/19  Yes Shah, Pratik D, DO  Blood Glucose Monitoring Suppl (ACCU-CHEK AVIVA) device Use as instructed Patient not taking: Reported  on 06/20/2019 06/03/15   Cassandria Anger, MD  Continuous Blood Gluc Sensor (FREESTYLE LIBRE 14 DAY SENSOR) MISC Inject 1 each into the skin every 14 (fourteen) days. Use as directed. Patient not taking: Reported on 06/20/2019 03/20/19   Cassandria Anger, MD  polyethylene glycol (MIRALAX / GLYCOLAX) 17 g packet Take 17 g by mouth daily as needed for mild constipation. Patient not taking: Reported on 06/20/2019 06/12/19   Heath Lark D, DO  Vitamin D, Ergocalciferol, (DRISDOL) 1.25 MG (50000 UT) CAPS capsule Take 1 capsule (50,000 Units total) by mouth every 7 (seven) days. Patient not taking: Reported on 06/07/2019 03/20/19   Cassandria Anger, MD    I have reviewed the patient's current and reported prior to admission medications.  Labs:  BMP Latest Ref Rng & Units 06/25/2019 06/24/2019 06/23/2019  Glucose 70 - 99 mg/dL 83 79 86  BUN 6 - 20 mg/dL 21(H) 21(H) 23(H)  Creatinine 0.44 - 1.00 mg/dL 2.85(H) 2.54(H) 2.60(H)  Sodium 135 - 145 mmol/L 139 140 139  Potassium 3.5 - 5.1 mmol/L 4.0 4.1 4.1  Chloride 98 - 111 mmol/L 111 111 109  CO2 22 - 32 mmol/L 21(L) 21(L) 20(L)  Calcium 8.9 - 10.3 mg/dL 8.5(L) 8.5(L) 8.5(L)    Urinalysis    Component Value Date/Time   COLORURINE YELLOW 06/21/2019 0805   APPEARANCEUR HAZY (A) 06/21/2019 0805   LABSPEC 1.009 06/21/2019 0805   PHURINE 5.0 06/21/2019 0805   GLUCOSEU NEGATIVE 06/21/2019 0805   HGBUR NEGATIVE 06/21/2019 0805   BILIRUBINUR NEGATIVE  06/21/2019 0805   KETONESUR NEGATIVE 06/21/2019 0805   PROTEINUR 100 (A) 06/21/2019 0805   UROBILINOGEN 0.2 01/07/2015 1723   NITRITE NEGATIVE 06/21/2019 0805   LEUKOCYTESUR NEGATIVE 06/21/2019 0805     ROS:  Pertinent items noted in HPI and remainder of comprehensive ROS otherwise negative.  Limited somewhat by patient as is a somewhat poor historian  Physical Exam: Vitals:   06/24/19 2103 06/25/19 0500  BP: 102/83 (!) 149/93  Pulse: 85 85  Resp: 18 18  Temp: 97.8 F (36.6 C) 97.7 F (36.5 C)  SpO2: 100% 98%     General: Adult female in bed in no acute distress at rest HEENT: Normocephalic atraumatic Eyes: External ocular movements intact sclera anicteric Neck: Supple trachea midline Heart: Regular rate and rhythm no rub Lungs: Clear to auscultation bilaterally normal work of breathing at rest Abdomen: Soft nondistended nontender Extremities: 2+ edema bilateral lower extremities and feet Skin: Skin lesions on anterior lower legs various stages of healing Neuro: Alert and oriented x3 provides history with limited details and follows commands Psych flat affect no anxiety or agitation  Assessment/Plan:  # AKI  - Felt secondary to vanc toxicity. NSAID's on her home medication list but pt states not using.  Renal US without hydro but increased echogenicity suggestive of medical renal disease 11.7 cm bilaterally.  UA with 100 mg/dL protein and 0-5.  Incidental uric acid crystals  - continue supportive care  - Would hold any further vancomycin  - Add on vanc level to AM labs - renal diet ordered  Strict ins and outs.  Check postvoid residual and Place Foley if retention of 250 mL urine or more - Gentle NS at 75 ml/hr x 12 hours - Would avoid NSAIDs.  Please do not continue her home ibuprofen on discharge  # Sacral osteomyelitis - Would avoid vancomycin - she is on ertapenem - Renally dosed antibiotics per primary team  # Anemia normocytic  -  noted FOBT positive  -  work-up per primary team  - predates her AKI with Hb 8-9's at the highest since 04/2019   # Hypertension - Control improved; avoid hypotension   # Metabolic acidosis  - secondary to AKI   # proteinuria  - likely 2/2 DM  - quantify up/cr ratio   Claudia Desanctis 06/25/2019, 12:21 PM

## 2019-06-25 NOTE — Progress Notes (Signed)
PHARMACY NOTE:  ANTIMICROBIAL RENAL DOSAGE ADJUSTMENT  Current antimicrobial regimen includes a mismatch between antimicrobial dosage and estimated renal function.  As per policy approved by the Pharmacy & Therapeutics and Medical Executive Committees, the antimicrobial dosage will be adjusted accordingly.  Current antimicrobial dosage:  Ertapenem 1000 mg IV every 24 hours.  Indication: sacral osteomyelitis  Renal Function:  Estimated Creatinine Clearance: 27.7 mL/min (A) (by C-G formula based on SCr of 2.85 mg/dL (H)). []      On intermittent HD, scheduled: []      On CRRT    Antimicrobial dosage has been changed to:  Ertapenem 500 mg IV every 24 hours.  Additional comments:   Thank you for allowing pharmacy to be a part of this patient's care.  Ramond Craver, [facility] 06/25/2019 8:40 AM

## 2019-06-25 NOTE — Progress Notes (Signed)
PROGRESS NOTE    Lindsey Murillo  [id number] DOB: 1995/10/08 DOA: 06/20/2019 PCP: Mikey Kirschner, MD   Brief Narrative:  24 year old with history of diabetic autonomic/peripheral neuropathy, asthma, hypertension, medical noncompliance, acanthosis nigricans recently admitted for sacral osteomyelitis discharged on vancomycin and Rocephin with last dose 07/18/2019. Comes back to the hospital with complaints of diarrhea and bilateral lower extremity swelling. Found to be in acute kidney injury upon admission.Infectious disease recommended transitioning antibiotics to IV ertapenem to complete her course.  2/13:Patient states that she has been having some diarrhea overnight, but denies any other complaints. States that she has been urinating, but does have some episodes of urinary retention is confirmed on bladder scan. She is able to void, however. She currently appears euvolemic and after discussion with nephrology, Dr. Deveron Furlong plan to hold IV fluid for now and also increase metoprolol dose for better blood pressure control. Urine electrolytes will be sent for further analysis and repeat renal panel to be monitored in a.m.  2/14: Patient denies any symptomatic complaints or concerns today.  She appears to be having good urine output.  Chart has been reviewed by nephrology with plans to continue the same for now and continue monitoring creatinine levels.  Her kidney injury clinical course appears to be at a plateau based on urine electrolyte analysis.  2/15: Creatinine has trended up to 2.85 today.  Appreciate nephrology consultation.  Bank trough to be measured in a.m.  Ertapenem dose to be adjusted today per pharmacy.  Continue close monitoring.  Discussed with GI regarding fecal occult positive and she will need outpatient follow-up for endoscopy in the very near future.  No overt bleeding otherwise identified.  Repeat CBC in a.m.  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 Creatinine today is 2.85 -Discussed with nephrology  and appreciate assistance in management -Vanco trough to be evaluated in a.m. -UA-negative. Renal ultrasound-negative  Bilateral lower extremity swelling -Lower extremity Dopplers negative -Appears improved  Sacral osteomyelitis -Appreciate infectious disease recommendations with transition to ertapenem -Last dose 07/18/2019  Hypomagnesemia -Repleted  Diabetes mellitus type 1, hypoglycemic episode due to poor oral intake Peripheral neuropathy/autonomic dysfunction -Discontinue Lantus. Insulin sliding scale and Accu-Chek for now -Appears to have brittle controloverall  Chronic normocytic anemia -Hemoglobin levels currently stable without overt bleeding -Fecal occult positive and discussed with gastroenterology who will plan to follow-up outpatient -She was noted to require PRBC transfusion during recent admission -Anemia panel without any acute findings  Essential hypertension -Continue home meds-metoprololwith increased dosing on 2/13   DVT prophylaxis:Change heparin to SCDs for now Code Status:Full Family Communication:Patient states that she will speak to family, none at bedside Disposition Plan:Continue to watch renal function.   Appreciate nephrology recommendations.  Continue to monitor while on ertapenem.      Consultants:  Nephrology  Procedures:  See imaging below  Antimicrobials:  Anti-infectives (From admission, onward)   Start     Dose/Rate Route Frequency Ordered Stop   06/25/19 1500  ertapenem (INVANZ) 500 mg in sodium chloride 0.9 %  50 mL IVPB     500 mg 100 mL/hr over 30 Minutes Intravenous Every 24 hours 06/25/19 0838     06/21/19 1415  ertapenem (INVANZ) 1,000 mg in sodium chloride 0.9 % 100 mL IVPB  Status:  Discontinued     1 g 200 mL/hr over 30 Minutes Intravenous Every 24 hours 06/21/19 1401 06/25/19 0838   06/21/19 0800  cefTRIAXone (ROCEPHIN) 2 g in sodium chloride 0.9 % 100 mL IVPB  Status:  Discontinued     2 g 200 mL/hr over 30 Minutes Intravenous Every 24 hours 06/20/19 2215 06/21/19 1401       Subjective: Patient seen and evaluated today with no new acute complaints or concerns. No acute concerns or events noted overnight.  She continues to have some complaints of ongoing diarrhea without any abdominal pain or fevers.  She has had this intermittently over the last year.  Objective: Vitals:   06/24/19 1007 06/24/19 1412 06/24/19 2103 06/25/19 0500  BP: (!) 145/107 (!) 135/106 102/83 (!) 149/93  Pulse:  78 85 85  Resp:  17 18 18   Temp:  97.7 F (36.5 C) 97.8 F (36.6 C) 97.7 F (36.5 C)  TempSrc:  Oral Oral Oral  SpO2:  100% 100% 98%  Weight:      Height:        Intake/Output Summary (Last 24 hours) at 06/25/2019 1215 Last data filed at 06/25/2019 1200 Gross per 24 hour  Intake --  Output 700 ml  Net -700 ml   Filed Weights   06/20/19 1639 06/20/19 2100  Weight: 59 kg 57.7 kg    Examination:  General exam: Appears calm and comfortable  Respiratory system: Clear to auscultation. Respiratory effort normal. Cardiovascular system: S1 & S2 heard, RRR. No JVD, murmurs, rubs, gallops or clicks. No pedal edema. Gastrointestinal system: Abdomen is nondistended, soft and nontender. No organomegaly or masses felt. Normal bowel sounds  heard. Central nervous system: Alert and oriented. No focal neurological deficits. Extremities: Symmetric 5 x 5 power. Skin: No rashes, lesions or ulcers Psychiatry: Flat affect    Data Reviewed: I have personally reviewed following labs and imaging studies  CBC: Recent Labs  Lab 06/20/19 1710 06/20/19 1710 06/21/19 0402 06/22/19 0406 06/23/19 0606 06/24/19 0635 06/25/19 0800  WBC 17.5*   < > 14.1* 10.1 8.9 8.2 8.8  NEUTROABS 14.1*  --   --   --   --   --   --   HGB 9.8*   < > 8.8* 8.8* 8.0* 7.9* 8.1*  HCT 31.8*   < > 28.1* 28.6* 26.0* 26.2* 25.7*  MCV 86.2   < > 84.9 86.1 87.0 86.5 86.0  PLT 498*   < > 396 405* 357 319 317   < > = values in this interval not displayed.   Basic Metabolic Panel: Recent Labs  Lab 06/21/19 0402 06/22/19 0406 06/23/19 0606 06/24/19 0635 06/25/19 0800  NA 136 139 139 140 139  K 4.1 4.2 4.1 4.1 4.0  CL 111 109 109 111 111  CO2 22 20* 20* 21* 21*  GLUCOSE 91 37* 86 79 83  BUN 27* 24* 23* 21* 21*  CREATININE 2.57* 2.50* 2.60* 2.54* 2.85*  CALCIUM 8.3* 8.5* 8.5* 8.5* 8.5*  MG  --  1.6* 1.9 1.8 1.7   GFR: Estimated Creatinine Clearance: 27.7 mL/min (A) (by C-G formula based on SCr of 2.85 mg/dL (H)). Liver Function Tests: Recent Labs  Lab 06/20/19 1710 06/21/19 0402 06/22/19 0406 06/23/19 0606 06/24/19  0635  AST 13* 9* 12* 11* 10*  ALT 16 11 11 10 9   ALKPHOS 276* 187* 201* 202* 196*  BILITOT 0.6 0.6 0.4 0.5 0.4  PROT 8.2* 5.6* 6.4* 5.9* 5.8*  ALBUMIN 2.0* 1.4* 1.5* 1.4* 1.4*   No results for input(s): LIPASE, AMYLASE in the last 168 hours. No results for input(s): AMMONIA in the last 168 hours. Coagulation Profile: No results for input(s): INR, PROTIME in the last 168 hours. Cardiac Enzymes: No results for input(s): CKTOTAL, CKMB, CKMBINDEX, TROPONINI in the last 168 hours. BNP (last 3 results) No results for input(s): PROBNP in the last 8760 hours. HbA1C: No results for input(s): HGBA1C in the last 72  hours. CBG: Recent Labs  Lab 06/24/19 1151 06/24/19 1610 06/24/19 2056 06/25/19 0756 06/25/19 1134  GLUCAP 95 102* 203* 72 106*   Lipid Profile: No results for input(s): CHOL, HDL, LDLCALC, TRIG, CHOLHDL, LDLDIRECT in the last 72 hours. Thyroid Function Tests: No results for input(s): TSH, T4TOTAL, FREET4, T3FREE, THYROIDAB in the last 72 hours. Anemia Panel: Recent Labs    06/23/19 0606 06/23/19 1304  VITAMINB12 982*  --   FOLATE  --  4.3*  FERRITIN 133  --   TIBC 200*  --   IRON 51  --   RETICCTPCT  --  0.7   Sepsis Labs: Recent Labs  Lab 06/20/19 1710  LATICACIDVEN 0.9    Recent Results (from the past 240 hour(s))  Respiratory Panel by RT PCR (Flu A&B, Covid) - Nasopharyngeal Swab     Status: None   Collection Time: 06/20/19  6:54 PM   Specimen: Nasopharyngeal Swab  Result Value Ref Range Status   SARS Coronavirus 2 by RT PCR NEGATIVE NEGATIVE Final    Comment: (NOTE) SARS-CoV-2 target nucleic acids are NOT DETECTED. The SARS-CoV-2 RNA is generally detectable in upper respiratoy specimens during the acute phase of infection. The lowest concentration of SARS-CoV-2 viral copies this assay can detect is 131 copies/mL. A negative result does not preclude SARS-Cov-2 infection and should not be used as the sole basis for treatment or other patient management decisions. A negative result may occur with  improper specimen collection/handling, submission of specimen other than nasopharyngeal swab, presence of viral mutation(s) within the areas targeted by this assay, and inadequate number of viral copies (<131 copies/mL). A negative result must be combined with clinical observations, patient history, and epidemiological information. The expected result is Negative. Fact Sheet for Patients:  PinkCheek.be Fact Sheet for Healthcare Providers:  GravelBags.it This test is not yet ap proved or cleared by the [place] FDA and  has been authorized for detection and/or diagnosis of SARS-CoV-2 by FDA under an Emergency Use Authorization (EUA). This EUA will remain  in effect (meaning this test can be used) for the duration of the COVID-19 declaration under Section 564(b)(1) of the Act, 21 U.S.C. section 360bbb-3(b)(1), unless the authorization is terminated or revoked sooner.    Influenza A by PCR NEGATIVE NEGATIVE Final   Influenza B by PCR NEGATIVE NEGATIVE Final    Comment: (NOTE) The Xpert Xpress SARS-CoV-2/FLU/RSV assay is intended as an aid in  the diagnosis of influenza from Nasopharyngeal swab specimens and  should not be used as a sole basis for treatment. Nasal washings and  aspirates are unacceptable for Xpert Xpress SARS-CoV-2/FLU/RSV  testing. Fact Sheet for Patients: PinkCheek.be Fact Sheet for Healthcare Providers: GravelBags.it This test is not yet approved or cleared by the [place] and  has been authorized  for detection and/or diagnosis of SARS-CoV-2 by  FDA under an Emergency Use Authorization (EUA). This EUA will remain  in effect (meaning this test can be used) for the duration of the  Covid-19 declaration under Section 564(b)(1) of the Act, 21  U.S.C. section 360bbb-3(b)(1), unless the authorization is  terminated or revoked. Performed at [facility], [address] [place], [place] [address]          Radiology Studies: No results found.      Scheduled Meds: . Chlorhexidine Gluconate Cloth  6 each Topical Daily  . heparin injection (subcutaneous)  5,000 Units Subcutaneous Q8H  . insulin aspart  0-5 Units Subcutaneous QHS  . insulin aspart  0-9 Units Subcutaneous TID WC  . metoprolol tartrate  37.5 mg Oral BID   Continuous Infusions: . ertapenem       LOS: 5 days    Time spent: 30 minutes    Maribel Hadley Darleen Crocker, DO [facility] Pager [phone]  If 7PM-7AM, please contact  night-coverage [email or web address] Password [email or web address] 06/25/2019, 12:15 PM

## 2019-06-25 NOTE — Plan of Care (Signed)
  Problem: Education: Goal: Knowledge of General Education information will improve Description: Including pain rating scale, medication(s)/side effects and non-pharmacologic comfort measures Outcome: Progressing   Problem: Clinical Measurements: Goal: Ability to maintain clinical measurements within normal limits will improve Outcome: Progressing Goal: Will remain free from infection Outcome: Progressing   Problem: Nutrition: Goal: Adequate nutrition will be maintained Outcome: Progressing   Problem: Elimination: Goal: Will not experience complications related to bowel motility Outcome: Progressing   Problem: Safety: Goal: Ability to remain free from injury will improve Outcome: Progressing   Problem: Skin Integrity: Goal: Risk for impaired skin integrity will decrease Outcome: Progressing

## 2019-06-25 NOTE — Progress Notes (Addendum)
Patient's BP was 158/116 @ ~1500.   Dr. Manuella Ghazi was notified.    10 mg Labetalol ordered and given.  See MAR.  BP rechecked @ ~1630.  97/71.

## 2019-06-26 ENCOUNTER — Encounter (HOSPITAL_COMMUNITY): Payer: Self-pay | Admitting: Internal Medicine

## 2019-06-26 DIAGNOSIS — K529 Noninfective gastroenteritis and colitis, unspecified: Secondary | ICD-10-CM

## 2019-06-26 DIAGNOSIS — R195 Other fecal abnormalities: Secondary | ICD-10-CM

## 2019-06-26 DIAGNOSIS — R1310 Dysphagia, unspecified: Secondary | ICD-10-CM

## 2019-06-26 DIAGNOSIS — D649 Anemia, unspecified: Secondary | ICD-10-CM

## 2019-06-26 LAB — GLUCOSE, CAPILLARY
Glucose-Capillary: 143 mg/dL — ABNORMAL HIGH (ref 70–99)
Glucose-Capillary: 89 mg/dL (ref 70–99)
Glucose-Capillary: 115 mg/dL — ABNORMAL HIGH (ref 70–99)
Glucose-Capillary: 126 mg/dL — ABNORMAL HIGH (ref 70–99)
Glucose-Capillary: 102 mg/dL — ABNORMAL HIGH (ref 70–99)

## 2019-06-26 LAB — PROTEIN / CREATININE RATIO, URINE
Creatinine, Urine: 49.62 mg/dL
Protein Creatinine Ratio: 0.97 mg/mg{Cre} — ABNORMAL HIGH (ref 0.00–0.15)
Total Protein, Urine: 48 mg/dL

## 2019-06-26 LAB — RENAL FUNCTION PANEL
Albumin: 1.4 g/dL — ABNORMAL LOW (ref 3.5–5.0)
Anion gap: 7 (ref 5–15)
BUN: 20 mg/dL (ref 6–20)
CO2: 22 mmol/L (ref 22–32)
Calcium: 8.4 mg/dL — ABNORMAL LOW (ref 8.9–10.3)
Chloride: 112 mmol/L — ABNORMAL HIGH (ref 98–111)
Creatinine, Ser: 2.52 mg/dL — ABNORMAL HIGH (ref 0.44–1.00)
GFR calc Af Amer: 30 mL/min — ABNORMAL LOW (ref >60)
GFR calc non Af Amer: 26 mL/min — ABNORMAL LOW (ref >60)
Glucose, Bld: 90 mg/dL (ref 70–99)
Phosphorus: 4.4 mg/dL (ref 2.5–4.6)
Potassium: 4.1 mmol/L (ref 3.5–5.1)
Sodium: 141 mmol/L (ref 135–145)

## 2019-06-26 LAB — CBC
HCT: 24.4 % — ABNORMAL LOW (ref 36.0–46.0)
Hemoglobin: 7.5 g/dL — ABNORMAL LOW (ref 12.0–15.0)
MCH: 26.4 pg (ref 26.0–34.0)
MCHC: 30.7 g/dL (ref 30.0–36.0)
MCV: 85.9 fL (ref 80.0–100.0)
Platelets: 294 10*3/uL (ref 150–400)
RBC: 2.84 MIL/uL — ABNORMAL LOW (ref 3.87–5.11)
RDW: 14.6 % (ref 11.5–15.5)
WBC: 7.6 10*3/uL (ref 4.0–10.5)
nRBC: 0 % (ref 0.0–0.2)

## 2019-06-26 LAB — RESPIRATORY PANEL BY RT PCR (FLU A&B, COVID)
Influenza A by PCR: NEGATIVE
Influenza B by PCR: NEGATIVE
SARS Coronavirus 2 by RT PCR: NEGATIVE

## 2019-06-26 LAB — MAGNESIUM: Magnesium: 1.6 mg/dL — ABNORMAL LOW (ref 1.7–2.4)

## 2019-06-26 LAB — PREPARE RBC (CROSSMATCH)

## 2019-06-26 MED ORDER — MAGNESIUM SULFATE 2 GM/50ML IV SOLN
2.0000 g | Freq: Once | INTRAVENOUS | Status: AC
  Administered 2019-06-26: 2 g via INTRAVENOUS
  Filled 2019-06-26: qty 50

## 2019-06-26 MED ORDER — SODIUM CHLORIDE 0.9 % IV SOLN
1.0000 g | INTRAVENOUS | Status: DC
  Administered 2019-06-26 – 2019-06-28 (×3): 1000 mg via INTRAVENOUS
  Filled 2019-06-26 (×6): qty 1

## 2019-06-26 MED ORDER — PRO-STAT SUGAR FREE PO LIQD
30.0000 mL | Freq: Two times a day (BID) | ORAL | Status: DC
  Administered 2019-06-26 – 2019-06-29 (×6): 30 mL via ORAL
  Filled 2019-06-26 (×6): qty 30

## 2019-06-26 MED ORDER — SODIUM CHLORIDE 0.9 % IV SOLN: INTRAVENOUS | Status: DC

## 2019-06-26 MED ORDER — METOCLOPRAMIDE HCL 10 MG PO TABS
10.0000 mg | ORAL_TABLET | Freq: Once | ORAL | Status: AC
  Administered 2019-06-26: 10 mg via ORAL
  Filled 2019-06-26: qty 1

## 2019-06-26 MED ORDER — PANTOPRAZOLE SODIUM 40 MG IV SOLR
40.0000 mg | INTRAVENOUS | Status: DC
  Administered 2019-06-26 – 2019-06-28 (×3): 40 mg via INTRAVENOUS
  Filled 2019-06-26 (×3): qty 40

## 2019-06-26 MED ORDER — SODIUM CHLORIDE 0.9% IV SOLUTION: Freq: Once | INTRAVENOUS | Status: AC

## 2019-06-26 MED ORDER — METOCLOPRAMIDE HCL 10 MG PO TABS
10.0000 mg | ORAL_TABLET | Freq: Once | ORAL | Status: AC
  Administered 2019-06-27: 10 mg via ORAL
  Filled 2019-06-26: qty 1

## 2019-06-26 MED ORDER — TEMAZEPAM 7.5 MG PO CAPS
7.5000 mg | ORAL_CAPSULE | Freq: Every evening | ORAL | Status: DC | PRN
  Administered 2019-06-26 – 2019-06-27 (×2): 7.5 mg via ORAL
  Filled 2019-06-26 (×2): qty 1

## 2019-06-26 NOTE — Consult Note (Signed)
Referring Provider: [facility] Primary Care Physician:  Mikey Kirschner, MD Primary Gastroenterologist:  Dr. Oneida Alar  Date of Admission: 06/20/19 Date of Consultation: 06/26/19  Reason for Consultation:  Anemia, heme positive stool  HPI:  Lindsey Murillo is a 24 y.o. year old female diabetic autonomic/peripheral neuropathy, asthma, hypertension, medical noncompliance, acanthosis nigricans, chronic diarrhea recently admitted from 1/27-2/2 for sacral osteomyelitis discharged on vancomycin and Rocephin. hospital with complaints of diarrhea and bilateral lower extremity swelling. Found to be in acute kidney injury upon admission, suspected to be secondary to vancomycin.Infectious disease recommended transitioning antibiotics to IV ertapenem to complete her course.   She was found to be occult blood positive on 06/25/2019 without overt bleeding.  Per nursing staff, apparently patient started having some overt bleeding per rectum with downtrending hemoglobin.  Hemoglobin 7.5 today, down from 9.8 on admission.  GI was consulted. 1 unit PRBCs ordered as nephrology has recommended transfusion for hemoglobin less than 8 given acute kidney injury.  EGD on 08/27/2016 with mild gastritis.  Stomach biopsies with reactive gastropathy, no H. pylori.  Small bowel biopsies benign.  Today:  States she went for a check up with PCP. Wasn't feeling bad. Had swelling in her legs. States PCP didn't get labs back and told her to go come to the ED.   Doesn't look at her stool. Doesn't know if there was blood in her stool or black stools. Mid lower abdominal pain. Present for years. 9/10. No change over the years. Present on admission. Comes and goes. No association with BM. No association with meals. Having about 7 BM daily. Stools are watery.  This is her baseline. Hasn't been taking pancreatic enzymes. Not sure if she was taking Bentyl. Trying to avoid dairy products. Vomited on Sunday. No blood present. No  nausea today. No Vomiting since Sunday. No N/V when at home. No GERD symptoms. Admits to dysphagia. Present for a couple of days. Only with pills. Drinking water will make them go on down. No trouble with liquids or foods.  No upper abdominal pain. Taking 2 Advil a few days a week for headaches for several years. No other NSAIDs.  Denies unintentional weight loss.   When she stands up, she gets lightheaded at times when her sugar is low. No pre-syncope or syncope. Admits to "a little" chest pain. More of a burning sensation. Comes and goes. Has felt this at home. Can't tell me how long this has been going on. Has never taken anything for it. Will resolve on its own. Not associated with exertion. No heart palpitations. No shortness of breath or cough. Denies hematuria or vaginal bleeding.    Past Medical History:  Diagnosis Date  . Acanthosis nigricans, acquired   . Asthma   . Diabetic autonomic neuropathy ([place])   . Diabetic peripheral neuropathy ([place])   . Environmental allergies   . Goiter   . Hypoglycemia associated with diabetes ([place])   . Tachycardia   . Thyroiditis, autoimmune   . Type 1 diabetes mellitus in patient age 54-19 years with HbA1C goal below 7.5     Past Surgical History:  Procedure Laterality Date  . BIOPSY  08/27/2016   Procedure: BIOPSY;  Surgeon: Danie Binder, MD;  Location: [facility];  Service: Endoscopy;;  duodenum; gastric  . COLONOSCOPY    . ESOPHAGOGASTRODUODENOSCOPY N/A 08/27/2016   Dr. Oneida Alar: mild gastritis. Negative celiac. No obvious source for dyspepsia/diarrhea    Prior to Admission medications   Medication Sig Start  Date End Date Taking? Authorizing Provider  acetaminophen (TYLENOL 8 HOUR ARTHRITIS PAIN) 650 MG CR tablet Take 650 mg by mouth every 8 (eight) hours as needed for pain.   Yes [provider]  bisacodyl (DULCOLAX) 10 MG suppository Place 1 suppository (10 mg total) rectally daily as needed for moderate constipation. 06/12/19  Yes  Shah, Pratik D, DO  cefTRIAXone (ROCEPHIN) IVPB Inject 2 g into the vein daily. Indication:  Sacral osteomyelitis Last Day of Therapy:  07/18/19 Labs - Once weekly:  CBC/D and BMP, Labs - Every other week:  ESR and CRP 06/09/19  Yes Shah, Pratik D, DO  glucagon 1 MG injection Follow package directions for low blood sugar. Patient taking differently: Inject 1 mg into the skin once as needed. Follow package directions for low blood sugar. 01/05/12  Yes Brennan, Michael J, MD  ibuprofen (ADVIL,MOTRIN) 200 MG tablet Take 600 mg by mouth daily as needed for mild pain or moderate pain.    Yes [provider]  insulin aspart (NOVOLOG FLEXPEN) 100 UNIT/ML FlexPen Inject 8-11 Units into the skin 3 (three) times daily with meals. 03/20/19  Yes Nida, Gebreselassie W, MD  insulin glargine (LANTUS) 100 UNIT/ML injection Inject 0.15 mLs (15 Units total) into the skin at bedtime. 06/12/19  Yes Shah, Pratik D, DO  magnesium oxide (MAG-OX) 400 MG tablet Take 1 tablet (400 mg total) by mouth daily. 06/12/19 07/12/19 Yes Shah, Pratik D, DO  metoprolol tartrate (LOPRESSOR) 25 MG tablet Take 1 tablet (25 mg total) by mouth 2 (two) times daily. 06/12/19 07/12/19 Yes Shah, Pratik D, DO  traMADol (ULTRAM) 50 MG tablet Take 1 tablet (50 mg total) by mouth every 8 (eight) hours as needed for moderate pain or severe pain. 06/12/19  Yes Shah, Pratik D, DO  vancomycin IVPB Inject 750 mg into the vein every 12 (twelve) hours. Indication:  Sacral osteomyelitis Last Day of Therapy:  07/18/19 Labs - Sunday/Monday:  CBC/D, BMP, and vancomycin trough. Labs - Thursday:  BMP and vancomycin trough Labs - Every other week:  ESR and CRP 06/09/19  Yes Shah, Pratik D, DO  Blood Glucose Monitoring Suppl (ACCU-CHEK AVIVA) device Use as instructed Patient not taking: Reported on 06/20/2019 06/03/15   Nida, Gebreselassie W, MD  Continuous Blood Gluc Sensor (FREESTYLE LIBRE 14 DAY SENSOR) MISC Inject 1 each into the skin every 14 (fourteen) days. Use  as directed. Patient not taking: Reported on 06/20/2019 03/20/19   Nida, Gebreselassie W, MD  polyethylene glycol (MIRALAX / GLYCOLAX) 17 g packet Take 17 g by mouth daily as needed for mild constipation. Patient not taking: Reported on 06/20/2019 06/12/19   Shah, Pratik D, DO  Vitamin D, Ergocalciferol, (DRISDOL) 1.25 MG (50000 UT) CAPS capsule Take 1 capsule (50,000 Units total) by mouth every 7 (seven) days. Patient not taking: Reported on 06/07/2019 03/20/19   Nida, Gebreselassie W, MD    Current Facility-Administered Medications  Medication Dose Route Frequency Provider Last Rate Last Admin  . acetaminophen (TYLENOL) tablet 650 mg  650 mg Oral Q6H PRN Emokpae, Ejiroghene E, MD       Or  . acetaminophen (TYLENOL) suppository 650 mg  650 mg Rectal Q6H PRN Emokpae, Ejiroghene E, MD      . Chlorhexidine Gluconate Cloth 2 % PADS 6 each  6 each Topical Daily Amin, Ankit Chirag, MD   6 each at 06/26/19 0954  . ertapenem (INVANZ) 1,000 mg in sodium chloride 0.9 % 100 mL IVPB  1 g Intravenous Q24H   Shah, Pratik D, DO      . feeding supplement (PRO-STAT SUGAR FREE 64) liquid 30 mL  30 mL Oral BID Shah, Pratik D, DO      . insulin aspart (novoLOG) injection 0-5 Units  0-5 Units Subcutaneous QHS Emokpae, Ejiroghene E, MD      . insulin aspart (novoLOG) injection 0-9 Units  0-9 Units Subcutaneous TID WC Emokpae, Ejiroghene E, MD   1 Units at 06/25/19 1651  . ipratropium-albuterol (DUONEB) 0.5-2.5 (3) MG/3ML nebulizer solution 3 mL  3 mL Nebulization Q4H PRN Amin, Ankit Chirag, MD      . labetalol (NORMODYNE) injection 10 mg  10 mg Intravenous Q2H PRN Shah, Pratik D, DO   10 mg at 06/25/19 1528  . loperamide (IMODIUM) capsule 2 mg  2 mg Oral PRN Shah, Pratik D, DO   2 mg at 06/25/19 1651  . magnesium sulfate IVPB 2 g 50 mL  2 g Intravenous Once Shah, Pratik D, DO      . metoprolol tartrate (LOPRESSOR) tablet 37.5 mg  37.5 mg Oral BID Shah, Pratik D, DO   37.5 mg at 06/26/19 0931  . ondansetron (ZOFRAN)  tablet 4 mg  4 mg Oral Q6H PRN Emokpae, Ejiroghene E, MD       Or  . ondansetron (ZOFRAN) injection 4 mg  4 mg Intravenous Q6H PRN Emokpae, Ejiroghene E, MD   4 mg at 06/24/19 0957  . pantoprazole (PROTONIX) injection 40 mg  40 mg Intravenous Q24H Shah, Pratik D, DO   40 mg at 06/26/19 1212  . traMADol (ULTRAM) tablet 50 mg  50 mg Oral Q8H PRN Emokpae, Ejiroghene E, MD   50 mg at 06/25/19 2201    Allergies as of 06/20/2019  . (No Known Allergies)    Family History  Problem Relation Age of Onset  . Diabetes Mother        Type II DM  . Thyroid disease Mother   . Diabetes Maternal Grandmother        Type II DM  . Diabetes Cousin        Type II DM  . Colon cancer Neg Hx   . Colon polyps Neg Hx     Social History   Socioeconomic History  . Marital status: Single    Spouse name: Not on file  . Number of children: Not on file  . Years of education: Not on file  . Highest education level: Not on file  Occupational History  . Not on file  Tobacco Use  . Smoking status: Never Smoker  . Smokeless tobacco: Never Used  Substance and Sexual Activity  . Alcohol use: No  . Drug use: No  . Sexual activity: Never    Birth control/protection: Abstinence  Other Topics Concern  . Not on file  Social History Narrative  . Not on file   Social Determinants of Health   Financial Resource Strain:   . Difficulty of Paying Living Expenses: Not on file  Food Insecurity:   . Worried About Running Out of Food in the Last Year: Not on file  . Ran Out of Food in the Last Year: Not on file  Transportation Needs:   . Lack of Transportation (Medical): Not on file  . Lack of Transportation (Non-Medical): Not on file  Physical Activity:   . Days of Exercise per Week: Not on file  . Minutes of Exercise per Session: Not on file  Stress:   . Feeling of Stress :   Not on file  Social Connections:   . Frequency of Communication with Friends and Family: Not on file  . Frequency of Social Gatherings  with Friends and Family: Not on file  . Attends Religious Services: Not on file  . Active Member of Clubs or Organizations: Not on file  . Attends Club or Organization Meetings: Not on file  . Marital Status: Not on file  Intimate Partner Violence:   . Fear of Current or Ex-Partner: Not on file  . Emotionally Abused: Not on file  . Physically Abused: Not on file  . Sexually Abused: Not on file    Review of Systems: Gen: See HPI CV: See HPI Resp: See HPI GI: See HPI GU : See HPI MS: Denies joint pain. Denies back pain.  Derm: Denies rash Psych: Denies depression or anxiety Heme: Denies bruising or bleeding  Physical Exam: Vital signs in last 24 hours: Temp:  [98.2 F (36.8 C)-99.1 F (37.3 C)] 98.7 F (37.1 C) (02/16 0931) Pulse Rate:  [85-94] 90 (02/16 0931) Resp:  [15-19] 16 (02/16 0533) BP: (97-158)/(68-116) 146/96 (02/16 0931) SpO2:  [99 %-100 %] 100 % (02/16 0931) Last BM Date: 06/25/19 General:   Alert,  Well-developed, well-nourished, pleasant and cooperative in NAD Head:  Normocephalic and atraumatic. Eyes:  Sclera clear, no icterus.   Conjunctiva pink. Ears:  Normal auditory acuity. Lungs:  Clear throughout to auscultation.   No wheezes, crackles, or rhonchi. No acute distress. Heart:  Regular rate and rhythm; no murmurs, clicks, rubs,  or gallops. Abdomen:  Soft, nontender and nondistended. No masses, hepatosplenomegaly or hernias noted. Normal bowel sounds, without guarding, and without rebound.   Rectal:  Deferred.  Msk:  Symmetrical without gross deformities. Normal posture. Extremities:  With bilateral lower extremity pitting edema.   Neurologic:  Alert and  oriented x4;  grossly normal neurologically. Skin: Healing ulcer noted on right lower leg. Psych:  Normal mood, flat affect.  Intake/Output from previous day: 02/15 0701 - 02/16 0700 In: 264.7 [P.O.:120; I.V.:44.7; IV Piggyback:100] Out: 1100 [Urine:1100] Intake/Output this shift: Total I/O In:  240 [P.O.:240] Out: 1000 [Urine:1000]  Lab Results: Recent Labs    06/24/19 0635 06/25/19 0800 06/26/19 0551  WBC 8.2 8.8 7.6  HGB 7.9* 8.1* 7.5*  HCT 26.2* 25.7* 24.4*  PLT 319 317 294   BMET Recent Labs    06/24/19 0635 06/25/19 0800 06/26/19 0551  NA 140 139 141  K 4.1 4.0 4.1  CL 111 111 112*  CO2 21* 21* 22  GLUCOSE 79 83 90  BUN 21* 21* 20  CREATININE 2.54* 2.85* 2.52*  CALCIUM 8.5* 8.5* 8.4*   LFT Recent Labs    06/24/19 0635 06/26/19 0551  PROT 5.8*  --   ALBUMIN 1.4* 1.4*  AST 10*  --   ALT 9  --   ALKPHOS 196*  --   BILITOT 0.4  --    PT/INR No results for input(s): LABPROT, INR in the last 72 hours. Hepatitis Panel No results for input(s): HEPBSAG, HCVAB, HEPAIGM, HEPBIGM in the last 72 hours. C-Diff No results for input(s): CDIFFTOX in the last 72 hours.  Studies/Results: No results found.  Impression: 23-year-old female with history of diabetic autonomic/peripheral neuropathy, asthma, hypertension, medical noncompliance, acanthosis nigricans, and chronic diarrhea recently admitted for sacral osteomyelitis and discharged on vancomycin and Rocephin, now readmitted for vancomycin induced nephrotoxicity.  She was noted to have downtrending hemoglobin during this admission with heme positive stool and GI was consulted.  Anemia/Heme   Positive Stool: Normocytic anemia noted in December 2020.  Hemoglobin in 8-9 range. Acute drop in hemoglobin during last admission down to 6.3 s/p 1 unit PRBCs. Hemoglobin at discharge  8.5. No overt GI bleeding at that time. Suspected secondary to chronic disease. Hg on this admission 9.8, now 7.5 today.  Stool occult blood positive on 06/25/2019.  Patient denies overt GI bleeding.  Discussed with nursing staff who state they have not seen any obvious bright red blood or melena.  Patient had BM while I was in the room which was green.  She does admit to long history of Advil a few days a week for headaches.  No other NSAIDs.  Denies GERD but admits to intermittent chest burning. Not on a PPI chronically. Dysphagia to pills x 2 days.   Suspect chronic normocytic anemia is likely secondary to chronic disease in the setting of osteomyelitis.  She was diagnosed with osteomyelitis of the right fifth metatarsal in December 2020 and sacral osteomyelitis on 06/07/2019.  Also with acute kidney injury this admission.  Downtrend this admission likely secondary to hemodilution as she received IV fluids on 2/13 and 2/15.  With mild gastritis on EGD in 2018 and ongoing Advil use, cannot rule out PUD or erosive gastropathy as the cause of heme positive stool. We will proceed with EGD with propofol with Dr. Rourk tomorrow. Will likely need colonoscopy in the future for complete evaluation. Plans for 1 unit PRBCs today per nephrology.   Dysphagia: Patient reports pills lodging in her esophagus around the sternal notch for the last couple of days. Denies GERD but admits to burning in her chest every now and then.  Not on a PPI outpatient.  Patient is having EGD tomorrow for anemia with heme positive stool; will add possible dilation.  Chronic Diarrhea: Currently with 7 watery BMs daily.  Per patient, this is her baseline.  Likely secondary to chronic pancreatitis with pancreatic insufficiency and/or diabetic enteropathy.  Less likely infectious diarrhea; however, patient has been on antibiotics for osteomyelitis.  Historically, diarrhea improved with Creon but she has not taken this in quite some time.  Stool testing for C. difficile has already been ordered.  We will wait for this to result.  I will go ahead and restart pancreatic enzymes.  May add Bentyl if C. difficile is negative.   Plan: Clear liquid diet today.  EGD +/- dilation with propofol with Dr. Rourk tomorrow. The risks, benefits, and alternatives have been discussed in detail with patient. They have stated understanding and desire to proceed.  NPO at midnight.  Reglan 10 mg today  and around 7 AM tomorrow to ensure stomach is empty for EGD. Continue Protonix 40 mg daily.  Will likely need TCS as an outpatient unless she has overt rectal bleeding.  Resume weight based Creon when diet is advanced.   Follow-up on C. difficile testing. Consider adding Bentyl if C. difficile is negative and still has ongoing diarrhea with Creon.   LOS: 6 days    06/26/2019, 12:14 PM   Unknown Flannigan, PA-C [facility]  

## 2019-06-26 NOTE — Progress Notes (Signed)
[facility] Progress Note  Name: Lindsey Murillo MRN: [id number] DOB: December 11, 1995   Subjective:  She had 1.1 liters UOP over 2/15.  She got normal saline at 75/hr x 12 hours yesterday.  Had 130 ml urine on bladder scan x 2.  Feels ok today - diarrhea is better.  We discussed the risks, benefits, and indications for transfusion of blood products and she consents to getting blood.  Review of systems:  Appetite is good  No n/v Denies shortness of breath or cough   ---------- Background on consult:  Lindsey Murillo is a 24 y.o. female with a history of hypertension, diabetes, medical noncompliance and recent diagnosis of sacral osteomyelitis who came to the hospital with diarrhea and swelling.  She was found to have AKI and her Vanc level was 62 (reported as random check on 2/10 at 17:10) .  Last dose of Vanco was on 2/10 per chart review however the patient states that it was actually on 2/8 that she did not have a dose on 2/9 or 2/10 creatinine trends as below; creatinine had plateaued at about 2 6 and now is up to 2.85.  Nephrology is consulted for assistance with management.  She had been hydrated per primary team.  Baseline Cr around 0.6.  She denies any known family history of CKD.  She denies Advil use at home.  States that she has been urinating without difficulty here and nursing reports they just emptied 700 ml of urine.  She provides limited additional history and answers "I don't know" to many questions.  Found to be heme positive and with blood per rectum here and she states that she does not know how long that has been going on.  Spoke with her mother on 2/15 to update.   Intake/Output Summary (Last 24 hours) at 06/26/2019 1011 Last data filed at 06/26/2019 0900 Gross per 24 hour  Intake 504.72 ml  Output 2100 ml  Net -1595.28 ml    Vitals:  Vitals:   06/25/19 2112 06/26/19 0533 06/26/19 0756 06/26/19 0931  BP: 105/68 (!) 136/96  (!) 146/96  Pulse: 89 94   90  Resp: 15 16    Temp: 98.2 F (36.8 C) 99.1 F (37.3 C)  98.7 F (37.1 C)  TempSrc: Oral Oral  Oral  SpO2: 100% 99% 99% 100%  Weight:      Height:         Physical Exam:  General adult female in bed in no acute distress HEENT normocephalic atraumatic extraocular movements intact sclera anicteric Neck supple trachea midline Lungs clear to auscultation bilaterally normal work of breathing at rest  Heart S1S2 no rub Abdomen soft nontender nondistended Extremities 1-2+ edema bilateral lower extremities Psych normal mood and affect Neuro - more conversant alert and oriented x 3; follows commands and provides Hx   Medications reviewed    Labs:  BMP Latest Ref Rng & Units 06/26/2019 06/25/2019 06/24/2019  Glucose 70 - 99 mg/dL 90 83 79  BUN 6 - 20 mg/dL 20 21(H) 21(H)  Creatinine 0.44 - 1.00 mg/dL 2.52(H) 2.85(H) 2.54(H)  Sodium 135 - 145 mmol/L 141 139 140  Potassium 3.5 - 5.1 mmol/L 4.1 4.0 4.1  Chloride 98 - 111 mmol/L 112(H) 111 111  CO2 22 - 32 mmol/L 22 21(L) 21(L)  Calcium 8.9 - 10.3 mg/dL 8.4(L) 8.5(L) 8.5(L)     Assessment/Plan:   # AKI   - Felt secondary to vanc toxicity. NSAID's on her home medication list  but pt states not using.  Renal US without hydro but increased echogenicity suggestive of medical renal disease 11.7 cm bilaterally.  UA with 100 mg/dL protein and 0-5.  Incidental uric acid crystals  - continue supportive care.  Hopeful for downtrend tomorrow   - Would hold any further vancomycin  - optimize anemia as below  - Strict ins and outs - Would avoid NSAIDs.  Please do not continue her home ibuprofen on discharge  # Sacral osteomyelitis - Would avoid vancomycin - she is on ertapenem - Renally dosed antibiotics per primary team  # Anemia normocytic   - FOBT and blood per rectum per pt/nurse on 2/15 - work-up per primary team  - predates her AKI with Hb 8-9's at the highest since 04/2019  - Would recommend transfusion for Hb < 8 given her AKI   - ordered PRBC's x 1 unit   # Hypertension  - Control improved; avoid hypotension   # Metabolic acidosis  - secondary to AKI and improving  # proteinuria  - likely 2/2 DM  - up/cr ratio 970 mg/g  Spoke with primary team   Claudia Desanctis, MD 06/26/2019 10:38 AM

## 2019-06-26 NOTE — Progress Notes (Signed)
PROGRESS NOTE    Lindsey Murillo  [id number] DOB: 01-Jul-1995 DOA: 06/20/2019 PCP: Mikey Kirschner, MD   Brief Narrative:  24 year old with history of diabetic autonomic/peripheral neuropathy, asthma, hypertension, medical noncompliance, acanthosis nigricans recently admitted for sacral osteomyelitis discharged on vancomycin and Rocephin with last dose 07/18/2019. Comes back to the hospital with complaints of diarrhea and bilateral lower extremity swelling. Found to be in acute kidney injury upon admission.Infectious disease recommended transitioning antibiotics to IV ertapenem to complete her course.  2/13:Patient states that she has been having some diarrhea overnight, but denies any other complaints. States that she has been urinating, but does have some episodes of urinary retention is confirmed on bladder scan. She is able to void, however. She currently appears euvolemic and after discussion with nephrology, Dr. Deveron Furlong plan to hold IV fluid for now and also increase metoprolol dose for better blood pressure control. Urine electrolytes will be sent for further analysis and repeat renal panel to be monitored in a.m.  2/14:Patient denies any symptomatic complaints or concerns today. She appears to be having good urine output. Chart has been reviewed by nephrology with plans to continue the same for now and continue monitoring creatinine levels. Her kidney injury clinical course appears to be at a plateau based on urine electrolyte analysis.  2/15: Creatinine has trended up to 2.85 today.  Appreciate nephrology consultation.  Bank trough to be measured in a.m.  Ertapenem dose to be adjusted today per pharmacy.  Continue close monitoring.  Discussed with GI regarding fecal occult positive and she will need outpatient follow-up for endoscopy in the very near future.  No overt bleeding otherwise identified.  Repeat CBC in a.m.  2/16: Urine output is maintained with over 1  L of output in the last 24 hours.  Diarrhea is improving.  Unfortunately, it appears that she is now having some overt bleeding per rectum according to the nursing staff.  This combined with her worsening anemia problems GI consultation while inpatient to further evaluate.  1 unit PRBCs ordered by nephrology.  Continue to monitor renal function.  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 Creatinine today is2.5 -Discussed with nephrology and appreciate assistance in management, will give PRBC today -Vanco trough 24 on 2/15 -UA-negative. Renal ultrasound-negative  Bilateral lower extremity swelling likely secondary to hypoalbuminemia -Lower extremity Dopplers negative -Consulted dietitian for recommendations and start prostat -Appears improved  Sacral osteomyelitis -Appreciate infectious disease recommendations with transition to ertapenem -Last dose 07/18/2019  Hypomagnesemia -Replete and re-evaluate in am  Diabetes mellitus type 1, hypoglycemic episode due to poor oral intake Peripheral neuropathy/autonomic dysfunction -Discontinue Lantus. Insulin sliding scale and Accu-Chek for now -Appears to have brittle controloverall  Acute on chronic normocytic anemia -Hemoglobin levelsdowntrending with PRBC x1 unit ordered by nephrology -Fecal occult positive on 2/15 and originally patient was going to follow-up outpatient, now with overt  bleeding per rectum however prompting inpatient evaluation. -Prior EGD in 2018 with gastritis noted -Anemia panel without any acute findings -Monitor and repeat CBC  in a.m.  Essential hypertension -Continue home meds-metoprololwith increased dosingon 2/13 -IV labetalol.   DVT prophylaxis:Discontinue heparin and try SCDs as tolerated Code Status:Full Family Communication:Spoke with mother at bedside 2/14 Disposition Plan:Continue to watch renal function.Appreciate nephrology recommendations. Continue to monitor while on ertapenem. Plan to transfuse 1 unit PRBC today with GI evaluation appreciated.   Consultants:  Nephrology  GI  Procedures:  See imaging below  Antimicrobials:  Anti-infectives (From admission, onward)   Start     Dose/Rate Route Frequency Ordered Stop   06/26/19 1500  ertapenem (INVANZ) 1,000 mg in sodium chloride 0.9 % 100 mL IVPB     1 g 200 mL/hr over 30 Minutes Intravenous Every 24 hours 06/26/19 0916     06/25/19 1500  ertapenem (INVANZ) 500 mg in sodium chloride 0.9 % 50 mL IVPB  Status:  Discontinued     500 mg 100 mL/hr over 30 Minutes Intravenous Every 24 hours 06/25/19 0838 06/26/19 0916   06/21/19 1415  ertapenem (INVANZ) 1,000 mg in sodium chloride 0.9 % 100 mL IVPB  Status:  Discontinued     1 g 200 mL/hr over 30 Minutes Intravenous Every 24 hours 06/21/19 1401 06/25/19 0838   06/21/19 0800  cefTRIAXone (ROCEPHIN) 2 g in sodium chloride 0.9 % 100 mL IVPB  Status:  Discontinued     2 g 200 mL/hr over 30 Minutes Intravenous Every 24 hours 06/20/19 2215 06/21/19 1401       Subjective: Patient seen and evaluated today with improvement in her diarrhea.  She continues to maintain good urine output.  She was noted to have blood per rectum yesterday.  Objective: Vitals:   06/25/19 2112 06/26/19 0533 06/26/19 0756 06/26/19 0931  BP: 105/68 (!) 136/96  (!) 146/96  Pulse: 89 94  90  Resp: 15 16    Temp: 98.2 F (36.8 C) 99.1  F (37.3 C)  98.7 F (37.1 C)  TempSrc: Oral Oral  Oral  SpO2: 100% 99% 99% 100%  Weight:      Height:        Intake/Output Summary (Last 24 hours) at 06/26/2019 1119 Last data filed at 06/26/2019 0900 Gross per 24 hour  Intake 504.72 ml  Output 2100 ml  Net -1595.28 ml   Filed Weights   06/20/19 1639 06/20/19 2100  Weight: 59 kg 57.7 kg    Examination:  General exam: Appears calm and comfortable  Respiratory system: Clear to auscultation. Respiratory effort normal. Cardiovascular system: S1 & S2 heard, RRR. No JVD, murmurs, rubs, gallops or clicks. No pedal edema. Gastrointestinal system: Abdomen is nondistended, soft and nontender. No organomegaly or masses felt. Normal bowel sounds heard. Central nervous system: Mostly somnolent Extremities: Bilateral lower extremity edema noted Skin: No rashes, lesions or ulcers Psychiatry: Flat affect    Data Reviewed: I have personally reviewed following labs and imaging studies  CBC: Recent Labs  Lab 06/20/19 1710 06/21/19 0402 06/22/19 0406 06/23/19 0606 06/24/19 0635 06/25/19 0800 06/26/19 0551  WBC 17.5*   < > 10.1 8.9 8.2 8.8 7.6  NEUTROABS 14.1*  --   --   --   --   --   --   HGB 9.8*   < > 8.8* 8.0* 7.9* 8.1* 7.5*  HCT 31.8*   < > 28.6* 26.0* 26.2* 25.7* 24.4*  MCV 86.2   < > 86.1 87.0 86.5 86.0 85.9  PLT 498*   < > 405* 357 319 317 294   < > = values in this interval  not displayed.   Basic Metabolic Panel: Recent Labs  Lab 06/22/19 0406 06/23/19 0606 06/24/19 0635 06/25/19 0800 06/26/19 0551  NA 139 139 140 139 141  K 4.2 4.1 4.1 4.0 4.1  CL 109 109 111 111 112*  CO2 20* 20* 21* 21* 22  GLUCOSE 37* 86 79 83 90  BUN 24* 23* 21* 21* 20  CREATININE 2.50* 2.60* 2.54* 2.85* 2.52*  CALCIUM 8.5* 8.5* 8.5* 8.5* 8.4*  MG 1.6* 1.9 1.8 1.7 1.6*  PHOS  --   --   --   --  4.4   GFR: Estimated Creatinine Clearance: 31.4 mL/min (A) (by C-G formula based on SCr of 2.52 mg/dL (H)). Liver Function Tests: Recent  Labs  Lab 06/20/19 1710 06/20/19 1710 06/21/19 0402 06/22/19 0406 06/23/19 0606 06/24/19 0635 06/26/19 0551  AST 13*  --  9* 12* 11* 10*  --   ALT 16  --  11 11 10 9   --   ALKPHOS 276*  --  187* 201* 202* 196*  --   BILITOT 0.6  --  0.6 0.4 0.5 0.4  --   PROT 8.2*  --  5.6* 6.4* 5.9* 5.8*  --   ALBUMIN 2.0*   < > 1.4* 1.5* 1.4* 1.4* 1.4*   < > = values in this interval not displayed.   No results for input(s): LIPASE, AMYLASE in the last 168 hours. No results for input(s): AMMONIA in the last 168 hours. Coagulation Profile: No results for input(s): INR, PROTIME in the last 168 hours. Cardiac Enzymes: No results for input(s): CKTOTAL, CKMB, CKMBINDEX, TROPONINI in the last 168 hours. BNP (last 3 results) No results for input(s): PROBNP in the last 8760 hours. HbA1C: No results for input(s): HGBA1C in the last 72 hours. CBG: Recent Labs  Lab 06/25/19 0756 06/25/19 1134 06/25/19 1623 06/25/19 2110 06/26/19 0732  GLUCAP 72 106* 143* 125* 89   Lipid Profile: No results for input(s): CHOL, HDL, LDLCALC, TRIG, CHOLHDL, LDLDIRECT in the last 72 hours. Thyroid Function Tests: No results for input(s): TSH, T4TOTAL, FREET4, T3FREE, THYROIDAB in the last 72 hours. Anemia Panel: Recent Labs    06/23/19 1304  FOLATE 4.3*  RETICCTPCT 0.7   Sepsis Labs: Recent Labs  Lab 06/20/19 1710  LATICACIDVEN 0.9    Recent Results (from the past 240 hour(s))  Respiratory Panel by RT PCR (Flu A&B, Covid) - Nasopharyngeal Swab     Status: None   Collection Time: 06/20/19  6:54 PM   Specimen: Nasopharyngeal Swab  Result Value Ref Range Status   SARS Coronavirus 2 by RT PCR NEGATIVE NEGATIVE Final    Comment: (NOTE) SARS-CoV-2 target nucleic acids are NOT DETECTED. The SARS-CoV-2 RNA is generally detectable in upper respiratoy specimens during the acute phase of infection. The lowest concentration of SARS-CoV-2 viral copies this assay can detect is 131 copies/mL. A negative result  does not preclude SARS-Cov-2 infection and should not be used as the sole basis for treatment or other patient management decisions. A negative result may occur with  improper specimen collection/handling, submission of specimen other than nasopharyngeal swab, presence of viral mutation(s) within the areas targeted by this assay, and inadequate number of viral copies (<131 copies/mL). A negative result must be combined with clinical observations, patient history, and epidemiological information. The expected result is Negative. Fact Sheet for Patients:  PinkCheek.be Fact Sheet for Healthcare Providers:  GravelBags.it This test is not yet ap proved or cleared by the [place] FDA and  has  been authorized for detection and/or diagnosis of SARS-CoV-2 by FDA under an Emergency Use Authorization (EUA). This EUA will remain  in effect (meaning this test can be used) for the duration of the COVID-19 declaration under Section 564(b)(1) of the Act, 21 U.S.C. section 360bbb-3(b)(1), unless the authorization is terminated or revoked sooner.    Influenza A by PCR NEGATIVE NEGATIVE Final   Influenza B by PCR NEGATIVE NEGATIVE Final    Comment: (NOTE) The Xpert Xpress SARS-CoV-2/FLU/RSV assay is intended as an aid in  the diagnosis of influenza from Nasopharyngeal swab specimens and  should not be used as a sole basis for treatment. Nasal washings and  aspirates are unacceptable for Xpert Xpress SARS-CoV-2/FLU/RSV  testing. Fact Sheet for Patients: PinkCheek.be Fact Sheet for Healthcare Providers: GravelBags.it This test is not yet approved or cleared by the [place] FDA and  has been authorized for detection and/or diagnosis of SARS-CoV-2 by  FDA under an Emergency Use Authorization (EUA). This EUA will remain  in effect (meaning this test can be used) for the duration of  the  Covid-19 declaration under Section 564(b)(1) of the Act, 21  U.S.C. section 360bbb-3(b)(1), unless the authorization is  terminated or revoked. Performed at [facility], [address] [place], [place] [address]          Radiology Studies: No results found.      Scheduled Meds: . sodium chloride   Intravenous Once  . Chlorhexidine Gluconate Cloth  6 each Topical Daily  . heparin injection (subcutaneous)  5,000 Units Subcutaneous Q8H  . insulin aspart  0-5 Units Subcutaneous QHS  . insulin aspart  0-9 Units Subcutaneous TID WC  . metoprolol tartrate  37.5 mg Oral BID   Continuous Infusions: . ertapenem    . magnesium sulfate bolus IVPB       LOS: 6 days    Time spent: 30 minutes    Delana Manganello Darleen Crocker, DO [facility] Pager [phone]  If 7PM-7AM, please contact night-coverage [email or web address] Password [facility] 06/26/2019, 11:19 AM

## 2019-06-26 NOTE — H&P (View-Only) (Signed)
Referring Provider: [facility] Primary Care Physician:  Mikey Kirschner, MD Primary Gastroenterologist:  Dr. Oneida Alar  Date of Admission: 06/20/19 Date of Consultation: 06/26/19  Reason for Consultation:  Anemia, heme positive stool  HPI:  Lindsey Murillo is a 24 y.o. year old female diabetic autonomic/peripheral neuropathy, asthma, hypertension, medical noncompliance, acanthosis nigricans, chronic diarrhea recently admitted from 1/27-2/2 for sacral osteomyelitis discharged on vancomycin and Rocephin. hospital with complaints of diarrhea and bilateral lower extremity swelling. Found to be in acute kidney injury upon admission, suspected to be secondary to vancomycin.Infectious disease recommended transitioning antibiotics to IV ertapenem to complete her course.   She was found to be occult blood positive on 06/25/2019 without overt bleeding.  Per nursing staff, apparently patient started having some overt bleeding per rectum with downtrending hemoglobin.  Hemoglobin 7.5 today, down from 9.8 on admission.  GI was consulted. 1 unit PRBCs ordered as nephrology has recommended transfusion for hemoglobin less than 8 given acute kidney injury.  EGD on 08/27/2016 with mild gastritis.  Stomach biopsies with reactive gastropathy, no H. pylori.  Small bowel biopsies benign.  Today:  States she went for a check up with PCP. Wasn't feeling bad. Had swelling in her legs. States PCP didn't get labs back and told her to go come to the ED.   Doesn't look at her stool. Doesn't know if there was blood in her stool or black stools. Mid lower abdominal pain. Present for years. 9/10. No change over the years. Present on admission. Comes and goes. No association with BM. No association with meals. Having about 7 BM daily. Stools are watery.  This is her baseline. Hasn't been taking pancreatic enzymes. Not sure if she was taking Bentyl. Trying to avoid dairy products. Vomited on Sunday. No blood present. No  nausea today. No Vomiting since Sunday. No N/V when at home. No GERD symptoms. Admits to dysphagia. Present for a couple of days. Only with pills. Drinking water will make them go on down. No trouble with liquids or foods.  No upper abdominal pain. Taking 2 Advil a few days a week for headaches for several years. No other NSAIDs.  Denies unintentional weight loss.   When she stands up, she gets lightheaded at times when her sugar is low. No pre-syncope or syncope. Admits to "a little" chest pain. More of a burning sensation. Comes and goes. Has felt this at home. Can't tell me how long this has been going on. Has never taken anything for it. Will resolve on its own. Not associated with exertion. No heart palpitations. No shortness of breath or cough. Denies hematuria or vaginal bleeding.    Past Medical History:  Diagnosis Date  . Acanthosis nigricans, acquired   . Asthma   . Diabetic autonomic neuropathy ([place])   . Diabetic peripheral neuropathy ([place])   . Environmental allergies   . Goiter   . Hypoglycemia associated with diabetes ([place])   . Tachycardia   . Thyroiditis, autoimmune   . Type 1 diabetes mellitus in patient age 54-19 years with HbA1C goal below 7.5     Past Surgical History:  Procedure Laterality Date  . BIOPSY  08/27/2016   Procedure: BIOPSY;  Surgeon: Danie Binder, MD;  Location: [facility];  Service: Endoscopy;;  duodenum; gastric  . COLONOSCOPY    . ESOPHAGOGASTRODUODENOSCOPY N/A 08/27/2016   Dr. Oneida Alar: mild gastritis. Negative celiac. No obvious source for dyspepsia/diarrhea    Prior to Admission medications   Medication Sig Start  Date End Date Taking? Authorizing Provider  acetaminophen (TYLENOL 8 HOUR ARTHRITIS PAIN) 650 MG CR tablet Take 650 mg by mouth every 8 (eight) hours as needed for pain.   Yes [provider]  bisacodyl (DULCOLAX) 10 MG suppository Place 1 suppository (10 mg total) rectally daily as needed for moderate constipation. 06/12/19  Yes  Shah, Pratik D, DO  cefTRIAXone (ROCEPHIN) IVPB Inject 2 g into the vein daily. Indication:  Sacral osteomyelitis Last Day of Therapy:  07/18/19 Labs - Once weekly:  CBC/D and BMP, Labs - Every other week:  ESR and CRP 06/09/19  Yes Shah, Pratik D, DO  glucagon 1 MG injection Follow package directions for low blood sugar. Patient taking differently: Inject 1 mg into the skin once as needed. Follow package directions for low blood sugar. 01/05/12  Yes Sherrlyn Hock, MD  ibuprofen (ADVIL,MOTRIN) 200 MG tablet Take 600 mg by mouth daily as needed for mild pain or moderate pain.    Yes [provider]  insulin aspart (NOVOLOG FLEXPEN) 100 UNIT/ML FlexPen Inject 8-11 Units into the skin 3 (three) times daily with meals. 03/20/19  Yes Nida, Marella Chimes, MD  insulin glargine (LANTUS) 100 UNIT/ML injection Inject 0.15 mLs (15 Units total) into the skin at bedtime. 06/12/19  Yes Shah, Pratik D, DO  magnesium oxide (MAG-OX) 400 MG tablet Take 1 tablet (400 mg total) by mouth daily. 06/12/19 07/12/19 Yes Shah, Pratik D, DO  metoprolol tartrate (LOPRESSOR) 25 MG tablet Take 1 tablet (25 mg total) by mouth 2 (two) times daily. 06/12/19 07/12/19 Yes Shah, Pratik D, DO  traMADol (ULTRAM) 50 MG tablet Take 1 tablet (50 mg total) by mouth every 8 (eight) hours as needed for moderate pain or severe pain. 06/12/19  Yes Shah, Pratik D, DO  vancomycin IVPB Inject 750 mg into the vein every 12 (twelve) hours. Indication:  Sacral osteomyelitis Last Day of Therapy:  07/18/19 Labs - Sunday/Monday:  CBC/D, BMP, and vancomycin trough. Labs - Thursday:  BMP and vancomycin trough Labs - Every other week:  ESR and CRP 06/09/19  Yes Shah, Pratik D, DO  Blood Glucose Monitoring Suppl (ACCU-CHEK AVIVA) device Use as instructed Patient not taking: Reported on 06/20/2019 06/03/15   Cassandria Anger, MD  Continuous Blood Gluc Sensor (FREESTYLE LIBRE 14 DAY SENSOR) MISC Inject 1 each into the skin every 14 (fourteen) days. Use  as directed. Patient not taking: Reported on 06/20/2019 03/20/19   Cassandria Anger, MD  polyethylene glycol (MIRALAX / GLYCOLAX) 17 g packet Take 17 g by mouth daily as needed for mild constipation. Patient not taking: Reported on 06/20/2019 06/12/19   Heath Lark D, DO  Vitamin D, Ergocalciferol, (DRISDOL) 1.25 MG (50000 UT) CAPS capsule Take 1 capsule (50,000 Units total) by mouth every 7 (seven) days. Patient not taking: Reported on 06/07/2019 03/20/19   Cassandria Anger, MD    Current Facility-Administered Medications  Medication Dose Route Frequency Provider Last Rate Last Admin  . acetaminophen (TYLENOL) tablet 650 mg  650 mg Oral Q6H PRN Emokpae, Ejiroghene E, MD       Or  . acetaminophen (TYLENOL) suppository 650 mg  650 mg Rectal Q6H PRN Emokpae, Ejiroghene E, MD      . Chlorhexidine Gluconate Cloth 2 % PADS 6 each  6 each Topical Daily Damita Lack, MD   6 each at 06/26/19 0954  . ertapenem (INVANZ) 1,000 mg in sodium chloride 0.9 % 100 mL IVPB  1 g Intravenous Q24H  Manuella Ghazi, Pratik D, DO      . feeding supplement (PRO-STAT SUGAR FREE 64) liquid 30 mL  30 mL Oral BID Manuella Ghazi, Pratik D, DO      . insulin aspart (novoLOG) injection 0-5 Units  0-5 Units Subcutaneous QHS Emokpae, Ejiroghene E, MD      . insulin aspart (novoLOG) injection 0-9 Units  0-9 Units Subcutaneous TID WC Emokpae, Ejiroghene E, MD   1 Units at 06/25/19 1651  . ipratropium-albuterol (DUONEB) 0.5-2.5 (3) MG/3ML nebulizer solution 3 mL  3 mL Nebulization Q4H PRN Amin, Ankit Chirag, MD      . labetalol (NORMODYNE) injection 10 mg  10 mg Intravenous Q2H PRN Manuella Ghazi, Pratik D, DO   10 mg at 06/25/19 1528  . loperamide (IMODIUM) capsule 2 mg  2 mg Oral PRN Manuella Ghazi, Pratik D, DO   2 mg at 06/25/19 1651  . magnesium sulfate IVPB 2 g 50 mL  2 g Intravenous Once Manuella Ghazi, Pratik D, DO      . metoprolol tartrate (LOPRESSOR) tablet 37.5 mg  37.5 mg Oral BID Manuella Ghazi, Pratik D, DO   37.5 mg at 06/26/19 0931  . ondansetron (ZOFRAN)  tablet 4 mg  4 mg Oral Q6H PRN Emokpae, Ejiroghene E, MD       Or  . ondansetron (ZOFRAN) injection 4 mg  4 mg Intravenous Q6H PRN Emokpae, Ejiroghene E, MD   4 mg at 06/24/19 0957  . pantoprazole (PROTONIX) injection 40 mg  40 mg Intravenous Q24H Manuella Ghazi, Pratik D, DO   40 mg at 06/26/19 1212  . traMADol (ULTRAM) tablet 50 mg  50 mg Oral Q8H PRN Emokpae, Ejiroghene E, MD   50 mg at 06/25/19 2201    Allergies as of 06/20/2019  . (No Known Allergies)    Family History  Problem Relation Age of Onset  . Diabetes Mother        Type II DM  . Thyroid disease Mother   . Diabetes Maternal Grandmother        Type II DM  . Diabetes Cousin        Type II DM  . Colon cancer Neg Hx   . Colon polyps Neg Hx     Social History   Socioeconomic History  . Marital status: Single    Spouse name: Not on file  . Number of children: Not on file  . Years of education: Not on file  . Highest education level: Not on file  Occupational History  . Not on file  Tobacco Use  . Smoking status: Never Smoker  . Smokeless tobacco: Never Used  Substance and Sexual Activity  . Alcohol use: No  . Drug use: No  . Sexual activity: Never    Birth control/protection: Abstinence  Other Topics Concern  . Not on file  Social History Narrative  . Not on file   Social Determinants of Health   Financial Resource Strain:   . Difficulty of Paying Living Expenses: Not on file  Food Insecurity:   . Worried About Charity fundraiser in the Last Year: Not on file  . Ran Out of Food in the Last Year: Not on file  Transportation Needs:   . Lack of Transportation (Medical): Not on file  . Lack of Transportation (Non-Medical): Not on file  Physical Activity:   . Days of Exercise per Week: Not on file  . Minutes of Exercise per Session: Not on file  Stress:   . Feeling of Stress :  Not on file  Social Connections:   . Frequency of Communication with Friends and Family: Not on file  . Frequency of Social Gatherings  with Friends and Family: Not on file  . Attends Religious Services: Not on file  . Active Member of Clubs or Organizations: Not on file  . Attends Archivist Meetings: Not on file  . Marital Status: Not on file  Intimate Partner Violence:   . Fear of Current or Ex-Partner: Not on file  . Emotionally Abused: Not on file  . Physically Abused: Not on file  . Sexually Abused: Not on file    Review of Systems: Gen: See HPI CV: See HPI Resp: See HPI GI: See HPI GU : See HPI MS: Denies joint pain. Denies back pain.  Derm: Denies rash Psych: Denies depression or anxiety Heme: Denies bruising or bleeding  Physical Exam: Vital signs in last 24 hours: Temp:  [98.2 F (36.8 C)-99.1 F (37.3 C)] 98.7 F (37.1 C) (02/16 0931) Pulse Rate:  [85-94] 90 (02/16 0931) Resp:  [15-19] 16 (02/16 0533) BP: (97-158)/(68-116) 146/96 (02/16 0931) SpO2:  [99 %-100 %] 100 % (02/16 0931) Last BM Date: 06/25/19 General:   Alert,  Well-developed, well-nourished, pleasant and cooperative in NAD Head:  Normocephalic and atraumatic. Eyes:  Sclera clear, no icterus.   Conjunctiva pink. Ears:  Normal auditory acuity. Lungs:  Clear throughout to auscultation.   No wheezes, crackles, or rhonchi. No acute distress. Heart:  Regular rate and rhythm; no murmurs, clicks, rubs,  or gallops. Abdomen:  Soft, nontender and nondistended. No masses, hepatosplenomegaly or hernias noted. Normal bowel sounds, without guarding, and without rebound.   Rectal:  Deferred.  Msk:  Symmetrical without gross deformities. Normal posture. Extremities:  With bilateral lower extremity pitting edema.   Neurologic:  Alert and  oriented x4;  grossly normal neurologically. Skin: Healing ulcer noted on right lower leg. Psych:  Normal mood, flat affect.  Intake/Output from previous day: 02/15 0701 - 02/16 0700 In: 264.7 [P.O.:120; I.V.:44.7; IV Piggyback:100] Out: 1100 [Urine:1100] Intake/Output this shift: Total I/O In:  240 [P.O.:240] Out: 1000 [Urine:1000]  Lab Results: Recent Labs    06/24/19 0635 06/25/19 0800 06/26/19 0551  WBC 8.2 8.8 7.6  HGB 7.9* 8.1* 7.5*  HCT 26.2* 25.7* 24.4*  PLT 319 317 294   BMET Recent Labs    06/24/19 0635 06/25/19 0800 06/26/19 0551  NA 140 139 141  K 4.1 4.0 4.1  CL 111 111 112*  CO2 21* 21* 22  GLUCOSE 79 83 90  BUN 21* 21* 20  CREATININE 2.54* 2.85* 2.52*  CALCIUM 8.5* 8.5* 8.4*   LFT Recent Labs    06/24/19 0635 06/26/19 0551  PROT 5.8*  --   ALBUMIN 1.4* 1.4*  AST 10*  --   ALT 9  --   ALKPHOS 196*  --   BILITOT 0.4  --    PT/INR No results for input(s): LABPROT, INR in the last 72 hours. Hepatitis Panel No results for input(s): HEPBSAG, HCVAB, HEPAIGM, HEPBIGM in the last 72 hours. C-Diff No results for input(s): CDIFFTOX in the last 72 hours.  Studies/Results: No results found.  Impression: 24 year old female with history of diabetic autonomic/peripheral neuropathy, asthma, hypertension, medical noncompliance, acanthosis nigricans, and chronic diarrhea recently admitted for sacral osteomyelitis and discharged on vancomycin and Rocephin, now readmitted for vancomycin induced nephrotoxicity.  She was noted to have downtrending hemoglobin during this admission with heme positive stool and GI was consulted.  Anemia/Heme  Positive Stool: Normocytic anemia noted in December 2020.  Hemoglobin in 8-9 range. Acute drop in hemoglobin during last admission down to 6.3 s/p 1 unit PRBCs. Hemoglobin at discharge  8.5. No overt GI bleeding at that time. Suspected secondary to chronic disease. Hg on this admission 9.8, now 7.5 today.  Stool occult blood positive on 06/25/2019.  Patient denies overt GI bleeding.  Discussed with nursing staff who state they have not seen any obvious bright red blood or melena.  Patient had BM while I was in the room which was green.  She does admit to long history of Advil a few days a week for headaches.  No other NSAIDs.  Denies GERD but admits to intermittent chest burning. Not on a PPI chronically. Dysphagia to pills x 2 days.   Suspect chronic normocytic anemia is likely secondary to chronic disease in the setting of osteomyelitis.  She was diagnosed with osteomyelitis of the right fifth metatarsal in December 2020 and sacral osteomyelitis on 06/07/2019.  Also with acute kidney injury this admission.  Downtrend this admission likely secondary to hemodilution as she received IV fluids on 2/13 and 2/15.  With mild gastritis on EGD in 2018 and ongoing Advil use, cannot rule out PUD or erosive gastropathy as the cause of heme positive stool. We will proceed with EGD with propofol with Dr. Gala Romney tomorrow. Will likely need colonoscopy in the future for complete evaluation. Plans for 1 unit PRBCs today per nephrology.   Dysphagia: Patient reports pills lodging in her esophagus around the sternal notch for the last couple of days. Denies GERD but admits to burning in her chest every now and then.  Not on a PPI outpatient.  Patient is having EGD tomorrow for anemia with heme positive stool; will add possible dilation.  Chronic Diarrhea: Currently with 7 watery BMs daily.  Per patient, this is her baseline.  Likely secondary to chronic pancreatitis with pancreatic insufficiency and/or diabetic enteropathy.  Less likely infectious diarrhea; however, patient has been on antibiotics for osteomyelitis.  Historically, diarrhea improved with Creon but she has not taken this in quite some time.  Stool testing for C. difficile has already been ordered.  We will wait for this to result.  I will go ahead and restart pancreatic enzymes.  May add Bentyl if C. difficile is negative.   Plan: Clear liquid diet today.  EGD +/- dilation with propofol with Dr. Gala Romney tomorrow. The risks, benefits, and alternatives have been discussed in detail with patient. They have stated understanding and desire to proceed.  NPO at midnight.  Reglan 10 mg today  and around 7 AM tomorrow to ensure stomach is empty for EGD. Continue Protonix 40 mg daily.  Will likely need TCS as an outpatient unless she has overt rectal bleeding.  Resume weight based Creon when diet is advanced.   Follow-up on C. difficile testing. Consider adding Bentyl if C. difficile is negative and still has ongoing diarrhea with Creon.   LOS: 6 days    06/26/2019, 12:14 PM   Aliene Altes, [facility]

## 2019-06-26 NOTE — Progress Notes (Signed)
PHARMACY NOTE:  ANTIMICROBIAL RENAL DOSAGE ADJUSTMENT  Current antimicrobial regimen includes a mismatch between antimicrobial dosage and estimated renal function.  As per policy approved by the Pharmacy & Therapeutics and Medical Executive Committees, the antimicrobial dosage will be adjusted accordingly.  Current antimicrobial dosage:  Ertapenem 500 mg IV every 24 hours.  Indication: sacral osteomyelitis  Renal Function:  Estimated Creatinine Clearance: 31.4 mL/min (A) (by C-G formula based on SCr of 2.52 mg/dL (H)). []      On intermittent HD, scheduled: []      On CRRT    Antimicrobial dosage has been changed to:  Ertapenem 1000 mg IV every 24 hours.  Additional comments:   Thank you for allowing pharmacy to be a part of this patient's care.  Isac Sarna, BS Pharm D, [place] Clinical Pharmacist Pager [phone] 06/26/2019 9:14 AM

## 2019-06-27 ENCOUNTER — Encounter: Payer: Self-pay | Admitting: Internal Medicine

## 2019-06-27 ENCOUNTER — Encounter (HOSPITAL_COMMUNITY)
Admission: EM | Disposition: A | Discharge status: Home Health | Payer: Self-pay | Source: Home / Self Care | Attending: Internal Medicine

## 2019-06-27 ENCOUNTER — Inpatient Hospital Stay (HOSPITAL_COMMUNITY): Payer: 59 | Admitting: Anesthesiology

## 2019-06-27 ENCOUNTER — Ambulatory Visit: Payer: Self-pay | Admitting: Gastroenterology

## 2019-06-27 ENCOUNTER — Encounter (HOSPITAL_COMMUNITY): Payer: Self-pay | Admitting: Internal Medicine

## 2019-06-27 DIAGNOSIS — M4628 Osteomyelitis of vertebra, sacral and sacrococcygeal region: Secondary | ICD-10-CM

## 2019-06-27 DIAGNOSIS — E1051 Type 1 diabetes mellitus with diabetic peripheral angiopathy without gangrene: Secondary | ICD-10-CM

## 2019-06-27 DIAGNOSIS — I10 Essential (primary) hypertension: Secondary | ICD-10-CM

## 2019-06-27 HISTORY — PX: ESOPHAGOGASTRODUODENOSCOPY (EGD) WITH PROPOFOL: SHX5813

## 2019-06-27 HISTORY — PX: BIOPSY: SHX5522

## 2019-06-27 LAB — MAGNESIUM: Magnesium: 1.8 mg/dL (ref 1.7–2.4)

## 2019-06-27 LAB — GLUCOSE, CAPILLARY
Glucose-Capillary: 85 mg/dL (ref 70–99)
Glucose-Capillary: 86 mg/dL (ref 70–99)
Glucose-Capillary: 75 mg/dL (ref 70–99)
Glucose-Capillary: 74 mg/dL (ref 70–99)
Glucose-Capillary: 180 mg/dL — ABNORMAL HIGH (ref 70–99)
Glucose-Capillary: 262 mg/dL — ABNORMAL HIGH (ref 70–99)

## 2019-06-27 LAB — TYPE AND SCREEN
ABO/RH(D): A POS
Antibody Screen: NEGATIVE
Unit division: 0

## 2019-06-27 LAB — BASIC METABOLIC PANEL
Anion gap: 8 (ref 5–15)
BUN: 22 mg/dL — ABNORMAL HIGH (ref 6–20)
CO2: 21 mmol/L — ABNORMAL LOW (ref 22–32)
Calcium: 8.6 mg/dL — ABNORMAL LOW (ref 8.9–10.3)
Chloride: 114 mmol/L — ABNORMAL HIGH (ref 98–111)
Creatinine, Ser: 2.36 mg/dL — ABNORMAL HIGH (ref 0.44–1.00)
GFR calc Af Amer: 32 mL/min — ABNORMAL LOW (ref >60)
GFR calc non Af Amer: 28 mL/min — ABNORMAL LOW (ref >60)
Glucose, Bld: 86 mg/dL (ref 70–99)
Potassium: 4.4 mmol/L (ref 3.5–5.1)
Sodium: 143 mmol/L (ref 135–145)

## 2019-06-27 LAB — CBC
HCT: 28.1 % — ABNORMAL LOW (ref 36.0–46.0)
Hemoglobin: 8.9 g/dL — ABNORMAL LOW (ref 12.0–15.0)
MCH: 27.2 pg (ref 26.0–34.0)
MCHC: 31.7 g/dL (ref 30.0–36.0)
MCV: 85.9 fL (ref 80.0–100.0)
Platelets: 268 10*3/uL (ref 150–400)
RBC: 3.27 MIL/uL — ABNORMAL LOW (ref 3.87–5.11)
RDW: 14.6 % (ref 11.5–15.5)
WBC: 7.2 10*3/uL (ref 4.0–10.5)
nRBC: 0 % (ref 0.0–0.2)

## 2019-06-27 LAB — BPAM RBC
Blood Product Expiration Date: 202102232359
ISSUE DATE / TIME: 202102161835
Unit Type and Rh: 600

## 2019-06-27 LAB — PREGNANCY, URINE: Preg Test, Ur: NEGATIVE

## 2019-06-27 SURGERY — ESOPHAGOGASTRODUODENOSCOPY (EGD) WITH PROPOFOL
Anesthesia: General

## 2019-06-27 MED ORDER — GLUCERNA SHAKE PO LIQD
237.0000 mL | Freq: Three times a day (TID) | ORAL | Status: DC
  Administered 2019-06-27 – 2019-06-29 (×5): 237 mL via ORAL

## 2019-06-27 MED ORDER — PROMETHAZINE HCL 25 MG/ML IJ SOLN: 6.2500 mg | INTRAMUSCULAR | Status: DC | PRN

## 2019-06-27 MED ORDER — LABETALOL HCL 5 MG/ML IV SOLN
10.0000 mg | INTRAVENOUS | Status: DC | PRN
  Administered 2019-06-27: 10 mg via INTRAVENOUS

## 2019-06-27 MED ORDER — PROPOFOL 10 MG/ML IV BOLUS
INTRAVENOUS | Status: AC
  Filled 2019-06-27: qty 20

## 2019-06-27 MED ORDER — FUROSEMIDE 10 MG/ML IJ SOLN
60.0000 mg | Freq: Once | INTRAMUSCULAR | Status: AC
  Administered 2019-06-27: 60 mg via INTRAVENOUS
  Filled 2019-06-27: qty 6

## 2019-06-27 MED ORDER — LABETALOL HCL 5 MG/ML IV SOLN
10.0000 mg | INTRAVENOUS | Status: AC | PRN
  Administered 2019-06-27 (×2): 10 mg via INTRAVENOUS

## 2019-06-27 MED ORDER — MIDAZOLAM HCL 2 MG/2ML IJ SOLN: 0.5000 mg | Freq: Once | INTRAMUSCULAR | Status: DC | PRN

## 2019-06-27 MED ORDER — PROPOFOL 500 MG/50ML IV EMUL
INTRAVENOUS | Status: DC | PRN
  Administered 2019-06-27: 150 ug/kg/min via INTRAVENOUS

## 2019-06-27 MED ORDER — LACTATED RINGERS IV SOLN: INTRAVENOUS | Status: DC

## 2019-06-27 MED ORDER — ADULT MULTIVITAMIN W/MINERALS CH
1.0000 | ORAL_TABLET | Freq: Every day | ORAL | Status: DC
  Administered 2019-06-27 – 2019-06-29 (×3): 1 via ORAL
  Filled 2019-06-27 (×3): qty 1

## 2019-06-27 MED ORDER — MIDAZOLAM HCL 2 MG/2ML IJ SOLN
1.0000 mg | Freq: Once | INTRAMUSCULAR | Status: AC
  Administered 2019-06-27: 1 mg via INTRAVENOUS

## 2019-06-27 MED ORDER — PROPOFOL 10 MG/ML IV BOLUS
INTRAVENOUS | Status: DC | PRN
  Administered 2019-06-27: 70 mg via INTRAVENOUS

## 2019-06-27 MED ORDER — MIDAZOLAM HCL 2 MG/2ML IJ SOLN
INTRAMUSCULAR | Status: AC
  Filled 2019-06-27: qty 2

## 2019-06-27 MED ORDER — HYDROCODONE-ACETAMINOPHEN 7.5-325 MG PO TABS: 1.0000 | ORAL_TABLET | Freq: Once | ORAL | Status: DC | PRN

## 2019-06-27 MED ORDER — MIDAZOLAM HCL 5 MG/5ML IJ SOLN
INTRAMUSCULAR | Status: DC | PRN
  Administered 2019-06-27: 2 mg via INTRAVENOUS

## 2019-06-27 MED ORDER — HYDROMORPHONE HCL 1 MG/ML IJ SOLN: 0.2500 mg | INTRAMUSCULAR | Status: DC | PRN

## 2019-06-27 MED ORDER — LABETALOL HCL 5 MG/ML IV SOLN
INTRAVENOUS | Status: AC
  Filled 2019-06-27: qty 4

## 2019-06-27 NOTE — Progress Notes (Signed)
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    GOAL:   Patient will meet greater than or equal to 90% of their needs    MONITOR:   Diet advancement, Labs, Weight trends, I & O's, Skin, PO intake, Supplement acceptance  REASON FOR ASSESSMENT:   Consult Assessment of nutrition requirement/status  ASSESSMENT:  24 year old female with medical history significant for DM1, peripheral neuropathy, asthma, HTN, history of noncompliance, acanthosis nigricans, chronic osteomyelitis with draining sinus of right lower extremity, and recent hospitalization 1/27-2/2 for sepsis secondary to cellulitis of sacral region with underlying sacral osteomyelitis, discharged home with PICC to complete 6-week course of ceftriaxone and vancomycin. Patient presented to ED with reports of elevated Vanco trough, and complains of multiple episodes of diarrhea daily and swelling to bilateral extremities over the past 3 days.  Patient admitted for vancomycin induced nephrotoxicity.   Per notes, AKI improving - pt to follow-up with [facility] 2 weeks after discharge.   2/15 - Nursing staff reported some overt bleeding per rectum and downtrending hemoglobin with heme positive stool. Patient is s/p 1 unit PRBC on 2/16 and NPO for EGD +/- dilation today.   Patient reports chronic diarrhea at baseline. Per GI, likely secondary to chronic pancreatitis with pancreatic insufficiency and/or diabetic enteropathy. Historically, improved with Creon which pt endorsed not taking in quite some time. Plans to resume weight based Creon with diet  advancement.   Patient with poor meal intake during admission, eating 38% average x 7 documented meals. RD will continue to monitor for diet order s/p EGD and provide nutrition supplements to aid with estimated calorie/protein needs.  Moderate pitting BLE, non-pitting sacral edema noted per 2/16 RN assessment.  Current wt 126.94 lbs Weight history reviewed, stable over the past year.  Medications reviewed and include: Prostat, Lasix, SS novolog, Protonix, Invanz   Labs: CBGs 85,102,126,115,89 x 24 hrs. BUN 22 (H), Cr 2.36 (H) - trending down, Hgb 8.9 (L) - trending up   NUTRITION - FOCUSED PHYSICAL EXAM: Unable to complete at this time, pt off floor for procedure.   Diet Order:   Diet Order            Diet Carb Modified Fluid consistency: Thin; Room service appropriate? Yes  Diet effective now              EDUCATION NEEDS:   No education needs have been identified at this time  Skin:  Skin Assessment: Reviewed RN Assessment  Last BM:  2/16 type 6  Height:   Ht Readings from Last 1 Encounters:  06/20/19 5\' 9"  (1.753 m)    Weight:   Wt Readings from Last 1 Encounters:  06/20/19 57.7 kg    Ideal Body Weight:  65.9 kg  BMI:  Body mass index is 18.78 kg/m.  Estimated Nutritional Needs:   Kcal:  1700-1900  Protein:  85-95  Fluid:  >/= 1.7 L/day   Lajuan Lines, RD, LDN Clinical Nutrition Office Telephone [phone] After Hours/Weekend Pager in [facility]

## 2019-06-27 NOTE — Transfer of Care (Signed)
Immediate Anesthesia Transfer of Care Note  Patient: Lindsey Murillo  Procedure(s) Performed: ESOPHAGOGASTRODUODENOSCOPY (EGD) WITH PROPOFOL with possible esophageal dilation (N/A ) BIOPSY  Patient Location: PACU  Anesthesia Type:MAC  Level of Consciousness: awake, alert , oriented and patient cooperative  Airway & Oxygen Therapy: Patient Spontanous Breathing  Post-op Assessment: Report given to RN, Post -op Vital signs reviewed and stable and Patient moving all extremities X 4  Post vital signs: Reviewed and stable  Last Vitals:  Vitals Value Taken Time  BP    Temp    Pulse    Resp    SpO2      Last Pain:  Vitals:   06/27/19 1109  TempSrc:   PainSc: 0-No pain      Patients Stated Pain Goal: 2 (23/76/28 3151)  Complications: No apparent anesthesia complications

## 2019-06-27 NOTE — Op Note (Addendum)
[facility] Patient Name: Lindsey Murillo Procedure Date: 06/27/2019 12:04 PM MRN: [id number] Date of Birth: [birth year] Attending MD: Norvel Richards , MD CSN: [id number] Age: 24 Admit Type: Inpatient Procedure:                Upper GI endoscopy Indications:              Dysphagia, Heme positive stool Providers:                Norvel Richards, MD, Janeece Riggers, RN, Raphael Gibney, Technician Referring MD:             Rosemary Holms, MD Medicines:                Propofol per Anesthesia Complications:            No immediate complications. Estimated Blood Loss:     Estimated blood loss was minimal. Estimated blood                            loss: none. Procedure:                Pre-Anesthesia Assessment:                           - Prior to the procedure, a History and Physical                            was performed, and patient medications and                            allergies were reviewed. The patient's tolerance of                            previous anesthesia was also reviewed. The risks                            and benefits of the procedure and the sedation                            options and risks were discussed with the patient.                            All questions were answered, and informed consent                            was obtained. Prior Anticoagulants: The patient has                            taken no previous anticoagulant or antiplatelet                            agents. ASA Grade Assessment: III - A patient with  severe systemic disease. After reviewing the risks                            and benefits, the patient was deemed in                            satisfactory condition to undergo the procedure.                           After obtaining informed consent, the endoscope was                            passed under direct vision. Throughout the   procedure, the patient's blood pressure, pulse, and                            oxygen saturations were monitored continuously. The                            [id number]) was introduced through the                            mouth, and advanced to the second part of duodenum.                            The upper GI endoscopy was accomplished without                            difficulty. The patient tolerated the procedure                            well. Scope In: 1:07:18 PM Scope Out: 1:15:43 PM Total Procedure Duration: 0 hours 8 minutes 25 seconds  Findings:      The examined esophagus was normal.      Focally excoriated appearing gastric mucosa along the greater curvature.       Appears most consistent with trauma. No ulcer or infiltrating process.       Patent pylorus. .      The duodenal bulb and second portion of the duodenum were normal. The       scope was withdrawn. Dilation was performed with a Maloney dilator with       mild resistance at 63 Fr. The dilation site was examined following       endoscope reinsertion and showed no change. Estimated blood loss: none.       Finally, abnormal gastric mucosa was biopsied with a cold forceps for       histology. Estimated blood loss was minimal Impression:               - Normal esophagus. Status post Maloney dilation.                           - Focal abnormality of the gastric mucosa likely                            result of trauma (heaving). Status post biopsy                           -  Normal duodenal bulb and second portion of the                            duodenum. Moderate Sedation:      Moderate (conscious) sedation was personally administered by an       anesthesia professional. The following parameters were monitored: oxygen       saturation, heart rate, blood pressure, respiratory rate, EKG, adequacy       of pulmonary ventilation, and response to care. Recommendation:           - Return patient to hospital  ward for ongoing care.                           - Diabetic (ADA) diet. Once daily PPI empirically.                           - Continue present medications.                           - Return to see Dr. Oneida Alar (date not yet                            determined). May need further GI evaluation (i.e.                            colonoscopy, etc.) as an outpatient. At patient                            request I called Worthy Flank at                            [phone] message on answering service. Procedure Code(s):        --- Professional ---                           [phone], Esophagogastroduodenoscopy, flexible,                            transoral; with biopsy, single or multiple                           43450, Dilation of esophagus, by unguided sound or                            bougie, single or multiple passes Diagnosis Code(s):        --- Professional ---                           K31.89, Other diseases of stomach and duodenum                           R13.10, Dysphagia, unspecified                           R19.5, Other fecal abnormalities CPT copyright 2019 American Medical Association. All rights reserved. The  codes documented in this report are preliminary and upon coder review may  be revised to meet current compliance requirements. Cristopher Estimable. Juno Alers, MD Norvel Richards, MD 06/27/2019 1:30:19 PM This report has been signed electronically. Number of Addenda: 0

## 2019-06-27 NOTE — Progress Notes (Signed)
[facility] Progress Note  Name: Lindsey Murillo MRN: [id number] DOB: Mar 02, 1996   Subjective:  GI was consulted on 2/16.  She received 1 unit PRBC's on 2/16 as well.  They plan for an EGD today.  She had 1.5 liters UOP over 2/16.  She states has never seen a kidney doctor prior to this admission.  Review of systems:  Has been NPO for EGD today   No n/v Has had abd pain  Denies shortness of breath or cough   ---------- Background on consult:  Lindsey Murillo is a 24 y.o. female with a history of hypertension, diabetes, medical noncompliance and recent diagnosis of sacral osteomyelitis who came to the hospital with diarrhea and swelling.  She was found to have AKI and her Vanc level was 62 (reported as random check on 2/10 at 17:10) .  Last dose of Vanco was on 2/10 per chart review however the patient states that it was actually on 2/8 that she did not have a dose on 2/9 or 2/10 creatinine trends as below; creatinine had plateaued at about 2 6 and now is up to 2.85.  Nephrology is consulted for assistance with management.  She had been hydrated per primary team.  Baseline Cr around 0.6.  She denies any known family history of CKD.  She denies Advil use at home.  States that she has been urinating without difficulty here and nursing reports they just emptied 700 ml of urine.  She provides limited additional history and answers "I don't know" to many questions.  Found to be heme positive and with blood per rectum here and she states that she does not know how long that has been going on.  Spoke with her mother on 2/15 to update.   Intake/Output Summary (Last 24 hours) at 06/27/2019 0903 Last data filed at 06/27/2019 0300 Gross per 24 hour  Intake 1250 ml  Output 450 ml  Net 800 ml    Vitals:  Vitals:   06/26/19 1910 06/26/19 1927 06/26/19 2139 06/27/19 0524  BP: 118/84  (!) 173/112 (!) 147/97  Pulse: 91  94 88  Resp: 14  16 17   Temp: (!) 97.5 F (36.4 C)  97.6 F  (36.4 C) 98.3 F (36.8 C)  TempSrc: Oral  Oral Oral  SpO2: 100% 100% 99% 99%  Weight:      Height:         Physical Exam:   General adult female in bed in no acute distress HEENT normocephalic atraumatic extraocular movements intact sclera anicteric Neck supple trachea midline Lungs clear to auscultation bilaterally normal work of breathing at rest  Heart S1S2 no rub Abdomen soft nontender nondistended Extremities 1-2+ edema bilateral lower extremities Psych normal mood and affect Neuro - more conversant alert and oriented x 3; follows commands and provides Hx   Medications reviewed    Labs:  BMP Latest Ref Rng & Units 06/27/2019 06/26/2019 06/25/2019  Glucose 70 - 99 mg/dL 86 90 83  BUN 6 - 20 mg/dL 22(H) 20 21(H)  Creatinine 0.44 - 1.00 mg/dL 2.36(H) 2.52(H) 2.85(H)  Sodium 135 - 145 mmol/L 143 141 139  Potassium 3.5 - 5.1 mmol/L 4.4 4.1 4.0  Chloride 98 - 111 mmol/L 114(H) 112(H) 111  CO2 22 - 32 mmol/L 21(L) 22 21(L)  Calcium 8.9 - 10.3 mg/dL 8.6(L) 8.4(L) 8.5(L)     Assessment/Plan:   # AKI   - Felt secondary to vanc toxicity. NSAID's on her home medication list but  pt states not using.  Renal US without hydro but increased echogenicity suggestive of medical renal disease 11.7 cm bilaterally.  UA with 100 mg/dL protein and 0-5.  Incidental uric acid crystals  - continue supportive care - Would hold any further vancomycin  - optimize anemia as below  - Strict ins and outs - Would avoid NSAIDs.  Please do not continue her home ibuprofen on discharge  # Sacral osteomyelitis - Would avoid vancomycin - she is on ertapenem - Renally dosed antibiotics per primary team  # Anemia normocytic   - FOBT and blood per rectum per pt/nurse on 2/15; per nursing none observed on 2/16 - work-up per primary team - GI has been consulted and for EGD today - Anemia predates her AKI with Hb 8-9's at the highest since 04/2019  - Would recommend transfusion for Hb < 8 given her AKI  -  had PRBC's x 1 unit on 2/16  # Hypertension - Blood pressure variable as charted   - With concurrent overload  - lasix IV x 1 dose now; avoid hypotension   # Metabolic acidosis    - secondary to AKI and improving  # proteinuria  - likely 2/2 DM  - up/cr ratio 970 mg/g  AKI is improving.  Will sign off.  Will need follow-up with [facility] weeks after discharge.    Claudia Desanctis, MD 06/27/2019 9:22 AM

## 2019-06-27 NOTE — Progress Notes (Signed)
Pt is eating from meal tray and mother is in room. She denies any pain at this time, and shows no visible distress. Call bell is within reach.

## 2019-06-27 NOTE — Progress Notes (Signed)
PROGRESS NOTE    LAMAE SEKERAK  [id number] DOB: May 23, 1995 DOA: 06/20/2019 PCP: Mikey Kirschner, MD   Brief Narrative:  24 year old with history of diabetic autonomic/peripheral neuropathy, asthma, hypertension, medical noncompliance, acanthosis nigricans recently admitted for sacral osteomyelitis discharged on vancomycin and Rocephin with last dose 07/18/2019. Comes back to the hospital with complaints of diarrhea and bilateral lower extremity swelling. Found to be in acute kidney injury upon admission.Infectious disease recommended transitioning antibiotics to IV ertapenem to complete her course.   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 Creatinine today is2.36 -Discussed with nephrology and appreciate assistance in management; will follow rec's. -Vanco trough 24 on 2/15 -UA-negative. Renal ultrasound-negative -vancomycin discontinued; patient on ertapenem now. -follow renal function trend.  Bilateral lower extremity swelling likely secondary to hypoalbuminemia -Lower extremity Dopplers negative -Consulted dietitian for recommendations and start prostat -Appears improved; will benefit of compression stockings 12 hrs on and 12 hours off. -ted hose ordered while inpatient.  Sacral osteomyelitis -Appreciate infectious disease recommendations with transition to ertapenem -Last dose  07/18/2019 -pharmacy helping with dosing and OPAT.  Hypomagnesemia -Repleted, will follow trend.  Diabetes mellitus type 1, hypoglycemic episode due to poor oral intake Peripheral neuropathy/autonomic dysfunction -Discontinue Lantus. Insulin sliding scale and Accu-Chek for now -Appears to have brittle controloverall; will follow CBG's.  Acute on chronic normocytic anemia -Hemoglobin levelswas downtrending, 1 unit of PRBC given -no further overt bleeding described -GI to perform EGD later today; will follow results -follow Hgb trend  -Fecal occult positive on 2/15; none since  Essential hypertension -Continue home meds-metoprololwith increased dosingon 2/13 -IV labetalol PRN. -VSS   DVT prophylaxis:Discontinue heparin and try SCDs as tolerated Code Status:Full Family Communication:no family at bedside. Disposition Plan:Continue to watch renal function.Appreciate nephrology recommendations. Continue to monitor while on ertapenem. Status post 1 unit of PRBC's and with plans for EGD and dilatation later today.  Consultants:  Nephrology  GI  Procedures:  See imaging below  Antimicrobials:  Anti-infectives (From admission, onward)   Start     Dose/Rate Route Frequency Ordered Stop   06/26/19 1500  ertapenem (INVANZ) 1,000 mg in sodium chloride 0.9 % 100 mL IVPB     1 g 200 mL/hr over 30 Minutes Intravenous Every 24 hours 06/26/19 0916     06/25/19 1500  ertapenem (INVANZ) 500 mg in sodium chloride 0.9 % 50 mL IVPB  Status:  Discontinued     500 mg 100 mL/hr over 30 Minutes Intravenous Every 24 hours 06/25/19 0838 06/26/19 0916   06/21/19 1415  ertapenem (INVANZ) 1,000 mg in sodium chloride 0.9 % 100 mL IVPB  Status:  Discontinued     1 g 200 mL/hr over 30 Minutes Intravenous Every 24 hours 06/21/19 1401 06/25/19 0838   06/21/19 0800  cefTRIAXone (ROCEPHIN) 2 g in sodium chloride 0.9 % 100 mL IVPB  Status:  Discontinued     2 g 200 mL/hr over 30  Minutes Intravenous Every 24 hours 06/20/19 2215 06/21/19  1401      Subjective: No further blood per rectum reported; expressed improvement in her urine output. No nausea, no vomiting. Patient is hungry.   Objective: Vitals:   06/27/19 1330 06/27/19 1345 06/27/19 2038 06/27/19 2123  BP: (!) 162/107 (!) 165/109  (!) 119/91  Pulse: 91   97  Resp: 17   19  Temp:    98.1 F (36.7 C)  TempSrc:    Oral  SpO2: 98%  98% 100%  Weight:      Height:        Intake/Output Summary (Last 24 hours) at 06/27/2019 2146 Last data filed at 06/27/2019 1700 Gross per 24 hour  Intake 1260 ml  Output 900 ml  Net 360 ml   Filed Weights   06/20/19 1639 06/20/19 2100  Weight: 59 kg 57.7 kg    Examination: General exam: Alert, awake, afebrile and following commands appropriately. Respiratory system: Clear to auscultation. Respiratory effort normal. Cardiovascular system:RRR. No murmurs, rubs, gallops. Gastrointestinal system: Abdomen is nondistended, soft and nontender. No organomegaly or masses felt. Normal bowel sounds heard. Central nervous system: Alert and oriented. No focal neurological deficits. Extremities: No Cyanosis or clubbing. Skin: No rashes, no petechiae; positive chronic stasis dermatitis on both legs appreciated.  Psychiatry: Mood & affect appropriate.      Data Reviewed: I have personally reviewed following labs and imaging studies  CBC: Recent Labs  Lab 06/23/19 0606 06/24/19 0635 06/25/19 0800 06/26/19 0551 06/27/19 0615  WBC 8.9 8.2 8.8 7.6 7.2  HGB 8.0* 7.9* 8.1* 7.5* 8.9*  HCT 26.0* 26.2* 25.7* 24.4* 28.1*  MCV 87.0 86.5 86.0 85.9 85.9  PLT 357 319 317 294 [id number]   Basic Metabolic Panel: Recent Labs  Lab 06/23/19 0606 06/24/19 0635 06/25/19 0800 06/26/19 0551 06/27/19 0615  NA 139 140 139 141 143  K 4.1 4.1 4.0 4.1 4.4  CL 109 111 111 112* 114*  CO2 20* 21* 21* 22 21*  GLUCOSE 86 79 83 90 86  BUN 23* 21* 21* 20 22*  CREATININE 2.60* 2.54* 2.85* 2.52*  2.36*  CALCIUM 8.5* 8.5* 8.5* 8.4* 8.6*  MG 1.9 1.8 1.7 1.6* 1.8  PHOS  --   --   --  4.4  --    GFR: Estimated Creatinine Clearance: 33.5 mL/min (A) (by C-G formula based on SCr of 2.36 mg/dL (H)).    Liver Function Tests: Recent Labs  Lab 06/21/19 0402 06/22/19 0406 06/23/19 0606 06/24/19 0635 06/26/19 0551  AST 9* 12* 11* 10*  --   ALT 11 11 10 9   --   ALKPHOS 187* 201* 202* 196*  --   BILITOT 0.6 0.4 0.5 0.4  --   PROT 5.6* 6.4* 5.9* 5.8*  --   ALBUMIN 1.4* 1.5* 1.4* 1.4* 1.4*   CBG: Recent Labs  Lab 06/27/19 1051 06/27/19 1139 06/27/19 1330 06/27/19 1619 06/27/19 2122  GLUCAP 86 75 74 180* 262*     Recent Results (from the past 240 hour(s))  Respiratory Panel by RT PCR (Flu A&B, Covid) - Nasopharyngeal Swab     Status: None   Collection Time: 06/20/19  6:54 PM   Specimen: Nasopharyngeal Swab  Result Value Ref Range Status   SARS Coronavirus 2 by RT PCR NEGATIVE NEGATIVE Final    Comment: (NOTE) SARS-CoV-2 target nucleic acids are NOT DETECTED. The SARS-CoV-2 RNA is generally detectable in upper respiratoy specimens during the acute phase of infection. The lowest concentration of SARS-CoV-2 viral copies this assay can detect is  131 copies/mL. A negative result does not preclude SARS-Cov-2 infection and should not be used as the sole basis for treatment or other patient management decisions. A negative result may occur with  improper specimen collection/handling, submission of specimen other than nasopharyngeal swab, presence of viral mutation(s) within the areas targeted by this assay, and inadequate number of viral copies (<131 copies/mL). A negative result must be combined with clinical observations, patient history, and epidemiological information. The expected result is Negative. Fact Sheet for Patients:  PinkCheek.be Fact Sheet for Healthcare Providers:  GravelBags.it This test is not yet ap  proved or cleared by the [place] FDA and  has been authorized for detection and/or diagnosis of SARS-CoV-2 by FDA under an Emergency Use Authorization (EUA). This EUA will remain  in effect (meaning this test can be used) for the duration of the COVID-19 declaration under Section 564(b)(1) of the Act, 21 U.S.C. section 360bbb-3(b)(1), unless the authorization is terminated or revoked sooner.    Influenza A by PCR NEGATIVE NEGATIVE Final   Influenza B by PCR NEGATIVE NEGATIVE Final    Comment: (NOTE) The Xpert Xpress SARS-CoV-2/FLU/RSV assay is intended as an aid in  the diagnosis of influenza from Nasopharyngeal swab specimens and  should not be used as a sole basis for treatment. Nasal washings and  aspirates are unacceptable for Xpert Xpress SARS-CoV-2/FLU/RSV  testing. Fact Sheet for Patients: PinkCheek.be Fact Sheet for Healthcare Providers: GravelBags.it This test is not yet approved or cleared by the [place] FDA and  has been authorized for detection and/or diagnosis of SARS-CoV-2 by  FDA under an Emergency Use Authorization (EUA). This EUA will remain  in effect (meaning this test can be used) for the duration of the  Covid-19 declaration under Section 564(b)(1) of the Act, 21  U.S.C. section 360bbb-3(b)(1), unless the authorization is  terminated or revoked. Performed at [facility], [address], [place], [place] [address]   Respiratory Panel by RT PCR (Flu A&B, Covid) -     Status: None   Collection Time: 06/26/19  6:28 PM  Result Value Ref Range Status   SARS Coronavirus 2 by RT PCR NEGATIVE NEGATIVE Final    Comment: (NOTE) SARS-CoV-2 target nucleic acids are NOT DETECTED. The SARS-CoV-2 RNA is generally detectable in upper respiratoy specimens during the acute phase of infection. The lowest concentration of SARS-CoV-2 viral copies this assay can detect is 131 copies/mL. A negative result does not  preclude SARS-Cov-2 infection and should not be used as the sole basis for treatment or other patient management decisions. A negative result may occur with  improper specimen collection/handling, submission of specimen other than nasopharyngeal swab, presence of viral mutation(s) within the areas targeted by this assay, and inadequate number of viral copies (<131 copies/mL). A negative result must be combined with clinical observations, patient history, and epidemiological information. The expected result is Negative. Fact Sheet for Patients:  PinkCheek.be Fact Sheet for Healthcare Providers:  GravelBags.it This test is not yet ap proved or cleared by the [place] FDA and  has been authorized for detection and/or diagnosis of SARS-CoV-2 by FDA under an Emergency Use Authorization (EUA). This EUA will remain  in effect (meaning this test can be used) for the duration of the COVID-19 declaration under Section 564(b)(1) of the Act, 21 U.S.C. section 360bbb-3(b)(1), unless the authorization is terminated or revoked sooner.    Influenza A by PCR NEGATIVE NEGATIVE Final   Influenza B by PCR NEGATIVE NEGATIVE Final  Comment: (NOTE) The Xpert Xpress SARS-CoV-2/FLU/RSV assay is intended as an aid in  the diagnosis of influenza from Nasopharyngeal swab specimens and  should not be used as a sole basis for treatment. Nasal washings and  aspirates are unacceptable for Xpert Xpress SARS-CoV-2/FLU/RSV  testing. Fact Sheet for Patients: PinkCheek.be Fact Sheet for Healthcare Providers: GravelBags.it This test is not yet approved or cleared by the [place] FDA and  has been authorized for detection and/or diagnosis of SARS-CoV-2 by  FDA under an Emergency Use Authorization (EUA). This EUA will remain  in effect (meaning this test can be used) for the duration of the    Covid-19 declaration under Section 564(b)(1) of the Act, 21  U.S.C. section 360bbb-3(b)(1), unless the authorization is  terminated or revoked. Performed at [facility], [address] [place], [place] [address]       Radiology Studies: No results found.   Scheduled Meds: . Chlorhexidine Gluconate Cloth  6 each Topical Daily  . feeding supplement (GLUCERNA SHAKE)  237 mL Oral TID BM  . feeding supplement (PRO-STAT SUGAR FREE 64)  30 mL Oral BID  . insulin aspart  0-5 Units Subcutaneous QHS  . insulin aspart  0-9 Units Subcutaneous TID WC  . metoprolol tartrate  37.5 mg Oral BID  . multivitamin with minerals  1 tablet Oral Daily  . pantoprazole (PROTONIX) IV  40 mg Intravenous Q24H   Continuous Infusions: . ertapenem 1,000 mg (06/27/19 1446)     LOS: 7 days    Time spent: 30 minutes    Barton Dubois, MD [facility] Pager [phone]   06/27/2019, 9:46 PM

## 2019-06-27 NOTE — Interval H&P Note (Signed)
History and Physical Interval Note:  06/27/2019 12:18 PM  Lindsey Murillo  has presented today for surgery, with the diagnosis of Anemia; heme positive stool.  The various methods of treatment have been discussed with the patient and family. After consideration of risks, benefits and other options for treatment, the patient has consented to  Procedure(s): ESOPHAGOGASTRODUODENOSCOPY (EGD) WITH PROPOFOL with possible esophageal dilation (N/A) as a surgical intervention.  The patient's history has been reviewed, patient examined, no change in status, stable for surgery.  I have reviewed the patient's chart and labs.  Questions were answered to the patient's satisfaction.     Manus Rudd Patient seen and examined in short stay. Gust with anesthesia.  Agree with EGD with possible esophageal dilation as feasible/appropriate per plan.  The risks, benefits, limitations, alternatives and imponderables have been reviewed with the patient. Potential for esophageal dilation, biopsy, etc. have also been reviewed.  Questions have been answered. All parties agreeable.

## 2019-06-27 NOTE — Progress Notes (Signed)
RN in EGD called and requested that pt have another pregnancy test per anesthesiologist. Urine cup and hap place in room and instructions given to nursing student and clinical instructor. Pt confirms understanding of instructions after it was explained multiple times.

## 2019-06-27 NOTE — Anesthesia Postprocedure Evaluation (Signed)
Anesthesia Post Note  Patient: [employer]  Procedure(s) Performed: ESOPHAGOGASTRODUODENOSCOPY (EGD) WITH PROPOFOL with possible esophageal dilation (N/A ) BIOPSY  Patient location during evaluation: PACU Anesthesia Type: MAC Level of consciousness: awake, oriented, awake and alert and patient cooperative Pain management: pain level controlled Vital Signs Assessment: post-procedure vital signs reviewed and stable Respiratory status: spontaneous breathing, respiratory function stable and nonlabored ventilation Cardiovascular status: stable Postop Assessment: no apparent nausea or vomiting Anesthetic complications: no     Last Vitals:  Vitals:   06/27/19 1240 06/27/19 1320  BP: (!) 174/116   Pulse:  91  Resp:  20  Temp:  36.5 C  SpO2:  98%    Last Pain:  Vitals:   06/27/19 1109  TempSrc:   PainSc: 0-No pain                 Darthula Desa

## 2019-06-27 NOTE — Anesthesia Preprocedure Evaluation (Signed)
Anesthesia Evaluation  Patient identified by MRN, date of birth, ID band Patient awake    Reviewed: Allergy & Precautions, NPO status , Patient's Chart, lab work & pertinent test results  Airway Mallampati: I  TM Distance: >3 FB Neck ROM: Full    Dental no notable dental hx. (+) Teeth Intact   Pulmonary asthma ,  Reports mild asthma - denies inhaler use in over a year    Pulmonary exam normal breath sounds clear to auscultation       Cardiovascular Exercise Tolerance: Poor hypertension, Pt. on medications Normal cardiovascular examII Rhythm:Regular Rate:Normal  Quite hypertensive in preop  Given 10 mg of labetolol x2 so far  States 10/10 anxiety - may be contributing  Reports poor ET BLE ?swelling after ATBX tx for Sacral osteomyelitis Denies known cardiac issues other than HTN   Neuro/Psych  Neuromuscular disease negative psych ROS   GI/Hepatic negative GI ROS, Neg liver ROS, Reports occ pills getting stuck  Denies GERD or GERD meds  Here for EGD   Endo/Other  negative endocrine ROSdiabetes, Well Controlled, Type 1, Insulin Dependent  Renal/GU Renal InsufficiencyRenal disease?AKI-? Drug related CrCl ~33.5  negative genitourinary   Musculoskeletal negative musculoskeletal ROS (+)   Abdominal   Peds negative pediatric ROS (+)  Hematology negative hematology ROS (+) anemia , Last H/H ~8.9/28.1   Anesthesia Other Findings   Reproductive/Obstetrics negative OB ROS                             Anesthesia Physical Anesthesia Plan  ASA: III  Anesthesia Plan: General   Post-op Pain Management:    Induction: Intravenous  PONV Risk Score and Plan: 3 and TIVA, Propofol infusion, Midazolam, Treatment may vary due to age or medical condition and Ondansetron  Airway Management Planned: Simple Face Mask and Nasal Cannula  Additional Equipment:   Intra-op Plan:   Post-operative Plan:    Informed Consent: I have reviewed the patients History and Physical, chart, labs and discussed the procedure including the risks, benefits and alternatives for the proposed anesthesia with the patient or authorized representative who has indicated his/her understanding and acceptance.     Dental advisory given  Plan Discussed with: CRNA  Anesthesia Plan Comments: (Plan Full PPE use  Plan GA with GETA as needed d/w pt -WTP with same after Q&A  Pt reports 10/10 anxiety. 0/10 pain currently  Midaz after d/w Dr. Gala Romney would be a good idea)        Anesthesia Quick Evaluation

## 2019-06-28 ENCOUNTER — Encounter: Payer: Self-pay | Admitting: Internal Medicine

## 2019-06-28 ENCOUNTER — Other Ambulatory Visit: Payer: Self-pay

## 2019-06-28 LAB — GLUCOSE, CAPILLARY
Glucose-Capillary: 124 mg/dL — ABNORMAL HIGH (ref 70–99)
Glucose-Capillary: 83 mg/dL (ref 70–99)
Glucose-Capillary: 137 mg/dL — ABNORMAL HIGH (ref 70–99)
Glucose-Capillary: 88 mg/dL (ref 70–99)

## 2019-06-28 LAB — BASIC METABOLIC PANEL
Anion gap: 10 (ref 5–15)
BUN: 27 mg/dL — ABNORMAL HIGH (ref 6–20)
CO2: 19 mmol/L — ABNORMAL LOW (ref 22–32)
Calcium: 9 mg/dL (ref 8.9–10.3)
Chloride: 114 mmol/L — ABNORMAL HIGH (ref 98–111)
Creatinine, Ser: 2.41 mg/dL — ABNORMAL HIGH (ref 0.44–1.00)
GFR calc Af Amer: 32 mL/min — ABNORMAL LOW (ref >60)
GFR calc non Af Amer: 27 mL/min — ABNORMAL LOW (ref >60)
Glucose, Bld: 110 mg/dL — ABNORMAL HIGH (ref 70–99)
Potassium: 4.6 mmol/L (ref 3.5–5.1)
Sodium: 143 mmol/L (ref 135–145)

## 2019-06-28 LAB — SURGICAL PATHOLOGY

## 2019-06-28 MED ORDER — PANTOPRAZOLE SODIUM 40 MG PO TBEC
40.0000 mg | DELAYED_RELEASE_TABLET | Freq: Every day | ORAL | Status: DC
  Administered 2019-06-29: 40 mg via ORAL
  Filled 2019-06-28: qty 1

## 2019-06-28 MED ORDER — PANCRELIPASE (LIP-PROT-AMYL) 12000-38000 UNITS PO CPEP
36000.0000 [IU] | ORAL_CAPSULE | ORAL | Status: DC
  Administered 2019-06-29: 36000 [IU] via ORAL

## 2019-06-28 MED ORDER — PANCRELIPASE (LIP-PROT-AMYL) 36000-114000 UNITS PO CPEP
72000.0000 [IU] | ORAL_CAPSULE | Freq: Three times a day (TID) | ORAL | Status: DC
  Administered 2019-06-28 – 2019-06-29 (×4): 72000 [IU] via ORAL
  Filled 2019-06-28 (×2): qty 2
  Filled 2019-06-28: qty 6
  Filled 2019-06-28 (×2): qty 2

## 2019-06-28 NOTE — Progress Notes (Addendum)
Brief follow up note:  Spoke with nursing. Patient tolerating diet today. Reported one formed stool in last 24 hours. No abdominal pain.   Lab Results  Component Value Date   CREATININE 2.41 (H) 06/28/2019   BUN 27 (H) 06/28/2019   NA 143 06/28/2019   K 4.6 06/28/2019   CL 114 (H) 06/28/2019   CO2 19 (L) 06/28/2019    Impression:  24 y/o with diabetic autonomic/peripheral neuropathy, asthma, Htn, medical noncompliance, chronic diarrhea, sacral osteomyelitis seen for anemia and heme + stool.   Anemia/heme positive stool: Normocytic anemia first noted 04/2019. Hgb 8-9 range. Drop during recent admission to 6.3 and received one unit of prbcs. Hgb 8.5 at discharge. Hgb this admission dropped from 9.8 to 7.5. stool heme positive but no overt gi bleeding. EGD this admission with focal abnormality of gastric mucosa likely due to trauma (heaving), s/p bx. Normal duodenum and esophagus. S/p esophageal dilation.  Dysphagia: normal esophagus on EGD. S/p empiric dilation.   Chronic diarrhea: baseline 7 watery stools daily. Previously did improve with pancreatic enzymes therefore suspected pancreatic insufficiency and/or diabetic enteropathy. Cdiff ordered but still not collected. Spoke with nursing, Crystal, no report of diarrhea in past 24 hours. Patient reported solid stool last night.  Advised to collect cdiff if recurrent diarrhea.   Plan: 1. Recommend Cdiff testing if recurrent diarrhea.  2. Weight based Creon.  3. Change to pantoprazole 40mg  daily.  4. Consider outpatient colonoscopy for evaluation of anemia, chronic diarrhea.  5. F/U pending path.  Laureen Ochs. Bernarda Caffey [facility] [phone] 2/18/202111:16 AM  Attending note:  Path reveals mild gastritis; no HP, dysplasia or neoplasia.  Agree with plan as outlined.

## 2019-06-28 NOTE — Addendum Note (Signed)
Addendum  created 06/28/19 1152 by Ollen Bowl, CRNA   Charge Capture section accepted

## 2019-06-28 NOTE — Progress Notes (Signed)
PROGRESS NOTE    Lindsey Murillo  [id number] DOB: Feb 15, 1996 DOA: 06/20/2019 PCP: Mikey Kirschner, MD   Brief Narrative:  24 year old with history of diabetic autonomic/peripheral neuropathy, asthma, hypertension, medical noncompliance, acanthosis nigricans recently admitted for sacral osteomyelitis discharged on vancomycin and Rocephin with last dose 07/18/2019. Comes back to the hospital with complaints of diarrhea and bilateral lower extremity swelling. Found to be in acute kidney injury upon admission.Infectious disease recommended transitioning antibiotics to IV ertapenem to complete her course.   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 Creatinine today is2.41, GFR has remained stable. -Discussed with nephrology and appreciate assistance in management; will follow rec's. -Vanco trough 24 on 2/15 -UA-negative. Renal ultrasound-negative -vancomycin discontinued; patient on ertapenem now. -follow renal function trend.  Bilateral lower extremity swelling likely secondary to hypoalbuminemia -Lower extremity Dopplers negative -Consulted dietitian for recommendations and start prostat -Appears improved; will benefit of compression stockings 12 hrs on and 12 hours off. -ted hose ordered while inpatient.  Sacral osteomyelitis -Appreciate infectious disease recommendations with transition  to ertapenem -Last dose anticipated for 07/18/2019 -pharmacy helping with dosing and OPAT.  Hypomagnesemia -Repleted, will follow trend.  Diabetes mellitus type 1, hypoglycemic episode due to poor oral intake Peripheral neuropathy/autonomic dysfunction -Discontinue Lantus. Insulin sliding scale and Accu-Chek for now -Appears to have brittle controloverall; will follow CBG's.  Acute on chronic normocytic anemia -Hemoglobin levelswas downtrending, 1 unit of PRBC given -no further overt bleeding described -GI to perform EGD later today; will follow results -follow Hgb trend  -Fecal occult positive on 2/15; none since  Essential hypertension -Continue home meds-metoprololwith increased dosingon 2/13 -IV labetalol PRN. -VSS   DVT prophylaxis:Discontinue heparin and try SCDs as tolerated Code Status:Full Family Communication:no family at bedside. Disposition Plan:Continue to watch renal function.Appreciate nephrology recommendations. Continue to monitor response while on ertapenem. Status post 1 unit of PRBC's on 2/16 and s/p EGD and dilatation on 06/27/19.  Consultants:  Nephrology  GI  Procedures:  See imaging below  EGD/dilatation to 1721.  Antimicrobials:  Anti-infectives (From admission, onward)   Start     Dose/Rate Route Frequency Ordered Stop   06/26/19 1500  ertapenem (INVANZ) 1,000 mg in sodium chloride 0.9 % 100 mL IVPB     1 g 200 mL/hr over 30 Minutes Intravenous Every 24 hours 06/26/19 0916     06/25/19 1500  ertapenem (INVANZ) 500 mg in sodium chloride 0.9 % 50 mL IVPB  Status:  Discontinued     500 mg 100 mL/hr over 30 Minutes Intravenous Every 24 hours 06/25/19 0838 06/26/19 0916   06/21/19 1415  ertapenem (INVANZ) 1,000 mg in sodium chloride 0.9 % 100 mL IVPB  Status:  Discontinued     1 g 200 mL/hr over 30 Minutes Intravenous Every 24 hours 06/21/19 1401 06/25/19 0838   06/21/19 0800  cefTRIAXone (ROCEPHIN) 2 g in sodium chloride  0.9 % 100 mL IVPB  Status:  Discontinued     2 g 200  mL/hr over 30 Minutes Intravenous Every 24 hours 06/20/19 2215 06/21/19 1401      Subjective: Formed stool overnight; no further bloody bowel movement.  Reports no nausea, no vomiting and tolerated diet after endoscopy/dilatation on 06/27/2019.  Objective: Vitals:   06/28/19 0558 06/28/19 0859 06/28/19 0929 06/28/19 1632  BP: (!) 138/100  (!) 170/109 (!) 148/103  Pulse: 97  94 88  Resp: 20   18  Temp: 98.2 F (36.8 C)   98 F (36.7 C)  TempSrc: Oral   Oral  SpO2: 100% 97%  90%  Weight:      Height:        Intake/Output Summary (Last 24 hours) at 06/28/2019 1755 Last data filed at 06/28/2019 1545 Gross per 24 hour  Intake 900 ml  Output 250 ml  Net 650 ml   Filed Weights   06/20/19 1639 06/20/19 2100  Weight: 59 kg 57.7 kg    Examination: General exam: Alert, awake, oriented x 3; reports able to tolerate diet and also has formed stool overnight.  No chest pain, no nausea, no vomiting; reports slightly further increase in urine output. Respiratory system: Clear to auscultation. Respiratory effort normal. Cardiovascular system:RRR. No murmurs, rubs, gallops. Gastrointestinal system: Abdomen is nondistended, soft and nontender. No organomegaly or masses felt. Normal bowel sounds heard. Central nervous system: Alert and oriented. No focal neurological deficits. Extremities: No cyanosis or clubbing; trace edema bilaterally. Skin: No rashes, chronic abscesses dermatitis appreciated on both legs. Psychiatry: Mood & affect appropriate.    Data Reviewed: I have personally reviewed following labs and imaging studies  CBC: Recent Labs  Lab 06/23/19 0606 06/24/19 0635 06/25/19 0800 06/26/19 0551 06/27/19 0615  WBC 8.9 8.2 8.8 7.6 7.2  HGB 8.0* 7.9* 8.1* 7.5* 8.9*  HCT 26.0* 26.2* 25.7* 24.4* 28.1*  MCV 87.0 86.5 86.0 85.9 85.9  PLT 357 319 317 294 [id number]   Basic Metabolic Panel: Recent Labs  Lab 06/23/19 0606  06/23/19 0606 06/24/19 0635 06/25/19 0800 06/26/19 0551 06/27/19 0615 06/28/19 0935  NA 139   < > 140 139 141 143 143  K 4.1   < > 4.1 4.0 4.1 4.4 4.6  CL 109   < > 111 111 112* 114* 114*  CO2 20*   < > 21* 21* 22 21* 19*  GLUCOSE 86   < > 79 83 90 86 110*  BUN 23*   < > 21* 21* 20 22* 27*  CREATININE 2.60*   < > 2.54* 2.85* 2.52* 2.36* 2.41*  CALCIUM 8.5*   < > 8.5* 8.5* 8.4* 8.6* 9.0  MG 1.9  --  1.8 1.7 1.6* 1.8  --   PHOS  --   --   --   --  4.4  --   --    < > = values in this interval not displayed.   GFR: Estimated Creatinine Clearance: 32.8 mL/min (A) (by C-G formula based on SCr of 2.41 mg/dL (H)).    Liver Function Tests: Recent Labs  Lab 06/22/19 0406 06/23/19 0606 06/24/19 0635 06/26/19 0551  AST 12* 11* 10*  --   ALT 11 10 9   --   ALKPHOS 201* 202* 196*  --   BILITOT 0.4 0.5 0.4  --   PROT 6.4* 5.9* 5.8*  --   ALBUMIN 1.5* 1.4* 1.4* 1.4*   CBG: Recent Labs  Lab 06/27/19 1619 06/27/19 2122 06/28/19 0746 06/28/19 1137 06/28/19 1641  GLUCAP 180* 262* 124* 83 137*  Recent Results (from the past 240 hour(s))  Respiratory Panel by RT PCR (Flu A&B, Covid) - Nasopharyngeal Swab     Status: None   Collection Time: 06/20/19  6:54 PM   Specimen: Nasopharyngeal Swab  Result Value Ref Range Status   SARS Coronavirus 2 by RT PCR NEGATIVE NEGATIVE Final    Comment: (NOTE) SARS-CoV-2 target nucleic acids are NOT DETECTED. The SARS-CoV-2 RNA is generally detectable in upper respiratoy specimens during the acute phase of infection. The lowest concentration of SARS-CoV-2 viral copies this assay can detect is 131 copies/mL. A negative result does not preclude SARS-Cov-2 infection and should not be used as the sole basis for treatment or other patient management decisions. A negative result may occur with  improper specimen collection/handling, submission of specimen other than nasopharyngeal swab, presence of viral mutation(s) within the areas targeted by  this assay, and inadequate number of viral copies (<131 copies/mL). A negative result must be combined with clinical observations, patient history, and epidemiological information. The expected result is Negative. Fact Sheet for Patients:  PinkCheek.be Fact Sheet for Healthcare Providers:  GravelBags.it This test is not yet ap proved or cleared by the [place] FDA and  has been authorized for detection and/or diagnosis of SARS-CoV-2 by FDA under an Emergency Use Authorization (EUA). This EUA will remain  in effect (meaning this test can be used) for the duration of the COVID-19 declaration under Section 564(b)(1) of the Act, 21 U.S.C. section 360bbb-3(b)(1), unless the authorization is terminated or revoked sooner.    Influenza A by PCR NEGATIVE NEGATIVE Final   Influenza B by PCR NEGATIVE NEGATIVE Final    Comment: (NOTE) The Xpert Xpress SARS-CoV-2/FLU/RSV assay is intended as an aid in  the diagnosis of influenza from Nasopharyngeal swab specimens and  should not be used as a sole basis for treatment. Nasal washings and  aspirates are unacceptable for Xpert Xpress SARS-CoV-2/FLU/RSV  testing. Fact Sheet for Patients: PinkCheek.be Fact Sheet for Healthcare Providers: GravelBags.it This test is not yet approved or cleared by the [place] FDA and  has been authorized for detection and/or diagnosis of SARS-CoV-2 by  FDA under an Emergency Use Authorization (EUA). This EUA will remain  in effect (meaning this test can be used) for the duration of the  Covid-19 declaration under Section 564(b)(1) of the Act, 21  U.S.C. section 360bbb-3(b)(1), unless the authorization is  terminated or revoked. Performed at [facility], [address], [place], [place] [address]   Respiratory Panel by RT PCR (Flu A&B, Covid) -     Status: None   Collection Time: 06/26/19  6:28  PM  Result Value Ref Range Status   SARS Coronavirus 2 by RT PCR NEGATIVE NEGATIVE Final    Comment: (NOTE) SARS-CoV-2 target nucleic acids are NOT DETECTED. The SARS-CoV-2 RNA is generally detectable in upper respiratoy specimens during the acute phase of infection. The lowest concentration of SARS-CoV-2 viral copies this assay can detect is 131 copies/mL. A negative result does not preclude SARS-Cov-2 infection and should not be used as the sole basis for treatment or other patient management decisions. A negative result may occur with  improper specimen collection/handling, submission of specimen other than nasopharyngeal swab, presence of viral mutation(s) within the areas targeted by this assay, and inadequate number of viral copies (<131 copies/mL). A negative result must be combined with clinical observations, patient history, and epidemiological information. The expected result is Negative. Fact Sheet for Patients:  PinkCheek.be Fact Sheet for Healthcare Providers:  GravelBags.it This test is not yet ap proved or cleared by the [place] and  has been authorized for detection and/or diagnosis of SARS-CoV-2 by FDA under an Emergency Use Authorization (EUA). This EUA will remain  in effect (meaning this test can be used) for the duration of the COVID-19 declaration under Section 564(b)(1) of the Act, 21 U.S.C. section 360bbb-3(b)(1), unless the authorization is terminated or revoked sooner.    Influenza A by PCR NEGATIVE NEGATIVE Final   Influenza B by PCR NEGATIVE NEGATIVE Final    Comment: (NOTE) The Xpert Xpress SARS-CoV-2/FLU/RSV assay is intended as an aid in  the diagnosis of influenza from Nasopharyngeal swab specimens and  should not be used as a sole basis for treatment. Nasal washings and  aspirates are unacceptable for Xpert Xpress SARS-CoV-2/FLU/RSV  testing. Fact Sheet for  Patients: PinkCheek.be Fact Sheet for Healthcare Providers: GravelBags.it This test is not yet approved or cleared by the [place] FDA and  has been authorized for detection and/or diagnosis of SARS-CoV-2 by  FDA under an Emergency Use Authorization (EUA). This EUA will remain  in effect (meaning this test can be used) for the duration of the  Covid-19 declaration under Section 564(b)(1) of the Act, 21  U.S.C. section 360bbb-3(b)(1), unless the authorization is  terminated or revoked. Performed at [facility], [address], [place], [place] [address]       Radiology Studies: No results found.   Scheduled Meds: . Chlorhexidine Gluconate Cloth  6 each Topical Daily  . feeding supplement (GLUCERNA SHAKE)  237 mL Oral TID BM  . feeding supplement (PRO-STAT SUGAR FREE 64)  30 mL Oral BID  . insulin aspart  0-5 Units Subcutaneous QHS  . insulin aspart  0-9 Units Subcutaneous TID WC  . lipase/protease/amylase  36,000 Units Oral With snacks  . lipase/protease/amylase  72,000 Units Oral TID WC  . metoprolol tartrate  37.5 mg Oral BID  . multivitamin with minerals  1 tablet Oral Daily  . [START ON 06/29/2019] pantoprazole  40 mg Oral QAC breakfast   Continuous Infusions: . ertapenem 1,000 mg (06/28/19 1545)     LOS: 8 days    Time spent: 30 minutes    Barton Dubois, MD [facility] Pager [phone]   06/28/2019, 5:55 PM

## 2019-06-29 ENCOUNTER — Other Ambulatory Visit: Payer: Self-pay | Admitting: *Deleted

## 2019-06-29 ENCOUNTER — Telehealth: Payer: Self-pay | Admitting: Nurse Practitioner

## 2019-06-29 DIAGNOSIS — N179 Acute kidney failure, unspecified: Secondary | ICD-10-CM

## 2019-06-29 DIAGNOSIS — M86371 Chronic multifocal osteomyelitis, right ankle and foot: Secondary | ICD-10-CM

## 2019-06-29 DIAGNOSIS — E109 Type 1 diabetes mellitus without complications: Secondary | ICD-10-CM

## 2019-06-29 DIAGNOSIS — N141 Nephropathy induced by other drugs, medicaments and biological substances: Secondary | ICD-10-CM

## 2019-06-29 DIAGNOSIS — E1043 Type 1 diabetes mellitus with diabetic autonomic (poly)neuropathy: Secondary | ICD-10-CM

## 2019-06-29 DIAGNOSIS — B955 Unspecified streptococcus as the cause of diseases classified elsewhere: Secondary | ICD-10-CM

## 2019-06-29 DIAGNOSIS — R7881 Bacteremia: Secondary | ICD-10-CM

## 2019-06-29 LAB — GLUCOSE, CAPILLARY
Glucose-Capillary: 76 mg/dL (ref 70–99)
Glucose-Capillary: 140 mg/dL — ABNORMAL HIGH (ref 70–99)

## 2019-06-29 LAB — CBC
HCT: 27.1 % — ABNORMAL LOW (ref 36.0–46.0)
Hemoglobin: 8.3 g/dL — ABNORMAL LOW (ref 12.0–15.0)
MCH: 26.6 pg (ref 26.0–34.0)
MCHC: 30.6 g/dL (ref 30.0–36.0)
MCV: 86.9 fL (ref 80.0–100.0)
Platelets: 244 10*3/uL (ref 150–400)
RBC: 3.12 MIL/uL — ABNORMAL LOW (ref 3.87–5.11)
RDW: 14.7 % (ref 11.5–15.5)
WBC: 8.7 10*3/uL (ref 4.0–10.5)
nRBC: 0 % (ref 0.0–0.2)

## 2019-06-29 LAB — BASIC METABOLIC PANEL
Anion gap: 8 (ref 5–15)
BUN: 31 mg/dL — ABNORMAL HIGH (ref 6–20)
CO2: 22 mmol/L (ref 22–32)
Calcium: 8.6 mg/dL — ABNORMAL LOW (ref 8.9–10.3)
Chloride: 110 mmol/L (ref 98–111)
Creatinine, Ser: 2.2 mg/dL — ABNORMAL HIGH (ref 0.44–1.00)
GFR calc Af Amer: 35 mL/min — ABNORMAL LOW (ref >60)
GFR calc non Af Amer: 30 mL/min — ABNORMAL LOW (ref >60)
Glucose, Bld: 82 mg/dL (ref 70–99)
Potassium: 4.9 mmol/L (ref 3.5–5.1)
Sodium: 140 mmol/L (ref 135–145)

## 2019-06-29 MED ORDER — PRO-STAT SUGAR FREE PO LIQD: 30.0000 mL | Freq: Two times a day (BID) | ORAL | 0 refills | Status: DC

## 2019-06-29 MED ORDER — ERTAPENEM IV (FOR PTA / DISCHARGE USE ONLY): 1.0000 g | INTRAVENOUS | 0 refills | Status: DC

## 2019-06-29 MED ORDER — PANCRELIPASE (LIP-PROT-AMYL) 36000-114000 UNITS PO CPEP: 72000.0000 [IU] | ORAL_CAPSULE | Freq: Three times a day (TID) | ORAL | 1 refills | Status: DC

## 2019-06-29 MED ORDER — GLUCERNA SHAKE PO LIQD: 237.0000 mL | Freq: Two times a day (BID) | ORAL | 0 refills | Status: DC

## 2019-06-29 MED ORDER — PANTOPRAZOLE SODIUM 40 MG PO TBEC: 40.0000 mg | DELAYED_RELEASE_TABLET | Freq: Every day | ORAL | 1 refills | Status: DC

## 2019-06-29 MED ORDER — PANCRELIPASE (LIP-PROT-AMYL) 36000-114000 UNITS PO CPEP: 36000.0000 [IU] | ORAL_CAPSULE | ORAL | 1 refills | Status: DC

## 2019-06-29 MED ORDER — METOPROLOL TARTRATE 37.5 MG PO TABS: 37.5000 mg | ORAL_TABLET | Freq: Two times a day (BID) | ORAL | 2 refills | Status: DC

## 2019-06-29 NOTE — Discharge Summary (Signed)
Physician Discharge Summary  Lindsey Murillo ALP:[id number] DOB: 1996-01-27 DOA: 06/20/2019  PCP: Mikey Kirschner, MD  Admit date: 06/20/2019 Discharge date: 06/29/2019  Time spent: 35 minutes  Recommendations for Outpatient Follow-up:  1. Repeat BMET to follow electrolyte and renal function  2. Repeat CBC to follow Hgb trend    Discharge Diagnoses:  Principal Problem:   Vancomycin-induced nephrotoxicity Active Problems:   Diabetic autonomic neuropathy ([name])   Diabetic peripheral neuropathy ([name])   Thyroiditis, autoimmune   Asthma   Essential hypertension, benign   Type 1 diabetes mellitus without complication ([name])   Chronic diarrhea   Sacral osteomyelitis ([name])   Normocytic anemia   Chronic multifocal osteomyelitis of right foot ([name])   Streptococcal bacteremia   Heme positive stool   Dysphagia   AKI (acute kidney injury) ([place])   Discharge Condition: stable and improved. Discharge home with instructions by nephrology, GI and PCP after discharge  Code status: Full code  Diet recommendation: renal diet and modified carbohydrates.   Filed Weights   06/20/19 1639 06/20/19 2100  Weight: 59 kg 57.7 kg    History of present illness:  24 year old with history of diabetic autonomic/peripheral neuropathy, asthma, hypertension, medical noncompliance, acanthosis nigricans recently admitted for sacral osteomyelitis discharged on vancomycin and Rocephin with last dose 07/18/2019. Comes back to the hospital with complaints of diarrhea and bilateral lower extremity swelling. Found to be in acute kidney injury upon admission.Infectious disease recommended transitioning antibiotics to IV ertapenem to complete her course.  Hospital Course:  Acute kidney injury, suspect ATN Vancomycin induced nephrotoxicity -Baseline creatinine 0.6, admission creatinine 2.6. Creatinine today is2.20, GFR has remained stable. -Discussed with nephrologyand appreciate assistance in management;  will follow rec's. -Vanco trough 24 on 2/15 -UA-negative. Renal ultrasound-negative -vancomycin discontinued; patient on ertapenem now. -follow renal function trend.  Bilateral lower extremity swelling likely secondary to hypoalbuminemia -Lower extremity Dopplers negative -Consulted dietitian for recommendations and start prostat -Appears improved; will benefit of compression stockings 12 hrs on and 12 hours off. Instructions discussed with patient.   Sacral osteomyelitis -Appreciate infectious disease recommendations with transition to ertapenem -Last dose anticipated for 07/18/2019 -pharmacy helping with dosing and OPAT.  Hypomagnesemia -Repleted -repeat Mg level to follow trend -most likely associated with GI loses.  Diabetes mellitus type 1, hypoglycemic episode due to poor oral intake Peripheral neuropathy/autonomic dysfunction -resume home hypoglycemic regimen and follow CBG's closely.  Acute on chronic normocytic anemia -Hemoglobin levelswas downtrending, 1 unit of PRBC given -no further overt bleeding described -GI to perform EGD later today; will follow results -repeat CBC to follow Hgb trend at follow up visit. -Fecal occult positive on 2/15; None since -outpatient colonoscopy for further evaluation recommended and will be arranged by GI service.   Essential hypertension -Continue home meds-metoprololwith increased dose -VSS at discharge -patient advise to follow heart healthy diet.  Pancreatic insufficiency  -patient started on creon as recommended by GI  GERD -continue PPI  Procedures:  See imaging below  EGD/dilatation to 06/27/19.  Consultations:  GI  Nephrology   Discharge Exam: Vitals:   06/29/19 0511 06/29/19 0955  BP: (!) 149/99 (!) 152/104  Pulse: 91 88  Resp: 18   Temp: 98.9 F (37.2 C)   SpO2:     General exam: Alert, awake, oriented x 3; reports able to tolerate diet and also has formed stool overnight.  No chest pain, no  nausea, no vomiting; reports increase in urine output. No bloody stool. Respiratory system: Clear to auscultation. Respiratory effort normal.  Cardiovascular system:RRR. No murmurs, rubs, gallops. Gastrointestinal system: Abdomen is nondistended, soft and nontender. No organomegaly or masses felt. Normal bowel sounds heard. Central nervous system: Alert and oriented. No focal neurological deficits. Extremities: No cyanosis or clubbing; trace edema bilaterally. Skin: No rashes, chronic abscesses dermatitis appreciated on both legs. Psychiatry: Mood & affect appropriate.    Discharge Instructions   Discharge Instructions    Diet - low sodium heart healthy   Complete by: As directed    Discharge instructions   Complete by: As directed    The medications are prescribed Avoid the use of NSAIDs over-the-counter Maintain adequate hydration Follow-up with infectious disease, renal service and gastroenterology service as instructed.   Home infusion instructions   Complete by: As directed    Instructions: Flushing of vascular access device: 0.9% NaCl pre/post medication administration and prn patency; Heparin 100 u/ml, 44m for implanted ports and Heparin 10u/ml, 562mfor all other central venous catheters.   Increase activity slowly   Complete by: As directed      Allergies as of 06/29/2019   No Known Allergies     Medication List    STOP taking these medications   bisacodyl 10 MG suppository Commonly known as: DULCOLAX   cefTRIAXone  IVPB Commonly known as: ROCEPHIN   ibuprofen 200 MG tablet Commonly known as: ADVIL   polyethylene glycol 17 g packet Commonly known as: MIRALAX / GLYCOLAX   vancomycin  IVPB     TAKE these medications   Accu-Chek Aviva device Use as instructed   Accu-Chek Guide test strip Generic drug: glucose blood Use to test blood sugar 4 times per day.   ertapenem  IVPB Commonly known as: INVANZ Inject 1 g into the vein daily for 19 days. Indication:   Sacral osteomyelitis and diabetic foot infection Last Day of Therapy:  07/17/19 Labs - Once weekly:  CBC/D and BMP, Labs - Every other week:  ESR and CRP   feeding supplement (GLUCERNA SHAKE) Liqd Take 237 mLs by mouth 2 (two) times daily between meals.   feeding supplement (PRO-STAT SUGAR FREE 64) Liqd Take 30 mLs by mouth 2 (two) times daily.   FreeStyle Libre 14 Day Sensor Misc Inject 1 each into the skin every 14 (fourteen) days. Use as directed.   glucagon 1 MG injection Follow package directions for low blood sugar. What changed:   how much to take  how to take this  when to take this  reasons to take this   insulin glargine 100 UNIT/ML injection Commonly known as: LANTUS Inject 0.15 mLs (15 Units total) into the skin at bedtime.   lipase/protease/amylase 36000 UNITS Cpep capsule Commonly known as: CREON Take 1 capsule (36,000 Units total) by mouth with snacks.   lipase/protease/amylase 36000 UNITS Cpep capsule Commonly known as: CREON Take 2 capsules (72,000 Units total) by mouth 3 (three) times daily with meals.   magnesium oxide 400 MG tablet Commonly known as: MAG-OX Take 1 tablet (400 mg total) by mouth daily.   Metoprolol Tartrate 37.5 MG Tabs Take 37.5 mg by mouth 2 (two) times daily. What changed:   medication strength  how much to take   NovoLOG FlexPen 100 UNIT/ML FlexPen Generic drug: insulin aspart Inject 8-11 Units into the skin 3 (three) times daily with meals.   pantoprazole 40 MG tablet Commonly known as: PROTONIX Take 1 tablet (40 mg total) by mouth daily before breakfast. Start taking on: June 30, 2019   traMADol 50 MG tablet Commonly known as:  ULTRAM Take 1 tablet (50 mg total) by mouth every 8 (eight) hours as needed for moderate pain or severe pain.   Tylenol 8 Hour Arthritis Pain 650 MG CR tablet Generic drug: acetaminophen Take 650 mg by mouth every 8 (eight) hours as needed for pain.   Vitamin D (Ergocalciferol)  1.25 MG (50000 UNIT) Caps capsule Commonly known as: DRISDOL Take 1 capsule (50,000 Units total) by mouth every 7 (seven) days.            Home Infusion Instuctions  (From admission, onward)         Start     Ordered   06/29/19 0000  Home infusion instructions    Question:  Instructions  Answer:  Flushing of vascular access device: 0.9% NaCl pre/post medication administration and prn patency; Heparin 100 u/ml, 32m for implanted ports and Heparin 10u/ml, 528mfor all other central venous catheters.   06/29/19 1336         No Known Allergies Follow-up Information    LuMikey KirschnerMD. Schedule an appointment as soon as possible for a visit in 10 day(s).   Specialty: Family Medicine Contact information: [address] [place] [phone]         The results of significant diagnostics from this hospitalization (including imaging, microbiology, ancillary and laboratory) are listed below for reference.    Significant Diagnostic Studies: CT ABDOMEN PELVIS W CONTRAST  Result Date: 06/07/2019 CLINICAL DATA:  Initial evaluation for acute abdominal abscess/infection. EXAM: CT ABDOMEN AND PELVIS WITH CONTRAST TECHNIQUE: Multidetector CT imaging of the abdomen and pelvis was performed using the standard protocol following bolus administration of intravenous contrast. CONTRAST:  10028mMNIPAQUE IOHEXOL 300 MG/ML  SOLN COMPARISON:  None. FINDINGS: Lower chest: Visualized lung bases are clear. Hepatobiliary: Liver demonstrates a normal contrast enhanced appearance. Mild diffuse periportal edema. Gallbladder contracted without abnormality. No biliary dilatation. Pancreas: Few scattered calcifications noted within the pancreas, stable. 2.2 x 4.6 cm cyst positioned just superior to the pancreatic tail again seen, unchanged. No acute peripancreatic inflammation. Spleen: Spleen within normal limits. Adrenals/Urinary Tract: Adrenal glands are normal. Kidneys equal in  size with symmetric enhancement. No nephrolithiasis, hydronephrosis or focal enhancing renal mass. No visible hydroureter. Bladder moderately distended without abnormality. Stomach/Bowel: Stomach within normal limits. No evidence for bowel obstruction. No definite acute inflammatory changes seen about the bowels. Appendix not well visualized. Vascular/Lymphatic: Normal intravascular enhancement seen throughout the intra-abdominal aorta. Mesenteric vessels patent proximally. No pathologically enlarged intra-abdominal or pelvic lymph nodes. Mildly enlarged bilateral inguinal lymph nodes measure up to 18 mm on the right and 14 mm on the left, indeterminate, but could be reactive. Reproductive: Uterus and ovaries within normal limits for age. Other: No free intraperitoneal air. Small volume free fluid present within the right lower quadrant and pelvis, nonspecific, but could be reactive and/or physiologic. Mild diffuse mesenteric edema noted. Musculoskeletal: Diffuse anasarca seen throughout the external soft tissues. There is focal soft tissue swelling and inflammatory stranding involving the subcutaneous fat posterior to the sacrum, concerning for infection/cellulitis. Overlying skin thickening. Inflammatory changes are fairly ill-defined at this time, most consistent with phlegmon. No definite discrete abscess or drainable fluid collection. There is a small focus of probable gas within this region, which could reflect infection with gas-forming organism or possibly communication with the overlying skin (series 2, image 59). No other dissecting soft tissue emphysema seen elsewhere. Additionally, evidence for osseous erosion at the underlying sacrum at approximately the S3/S4  segment, highly suggestive of associated osteomyelitis. IMPRESSION: 1. Soft tissue swelling with inflammatory stranding involving the subcutaneous fat posterior to the sacrum, concerning for infection/cellulitis. Small focus of gas within this  region could reflect changes related to an aggressive soft tissue infection with gas-forming organism or possibly communication with the overlying skin (series 2, image 59). Correlation with physical exam for possible ulceration and/or sinus tract recommended. 2. Associated osseous erosion at the underlying sacrum at approximately the S3/S4 segment, highly suggestive of associated osteomyelitis. 3. Small volume free fluid within the right lower quadrant, nonspecific, but could be reactive and/or physiologic. 4. Mildly enlarged bilateral inguinal lymph nodes, nonspecific, but may be reactive. 5. Atrophic pancreas with scattered calcifications, consistent with chronic pancreatitis. 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 Electronically Signed   By: Jeannine Boga M.D.   On: 06/07/2019 01:59   US RENAL  Result Date: 06/21/2019 CLINICAL DATA:  Acute kidney injury. EXAM: RENAL / URINARY TRACT ULTRASOUND COMPLETE COMPARISON:  Sep 07, 2012. FINDINGS: Right Kidney: Renal measurements: 11.7 x 6.4 x 5.7 cm = volume: 227 mL. Increased echogenicity of renal parenchyma is noted suggesting medical renal disease. No mass or hydronephrosis visualized. Left Kidney: Renal measurements: 11.7 x 6.5 x 6.6 cm = volume: 266 mL. Increased echogenicity of renal parenchyma is noted suggesting medical renal disease. No mass or hydronephrosis visualized. Bladder: Appears normal for degree of bladder distention. Left ureteral jet is noted. Right ureteral jet is not visualized. Calculated prevoid volume of 112 mL. Other: None. IMPRESSION: Increased echogenicity of renal parenchyma is noted bilaterally suggesting medical renal disease. No hydronephrosis or renal obstruction is noted. Electronically Signed   By: Marijo Conception M.D.   On: 06/21/2019 11:47   MR SACRUM SI JOINTS W WO CONTRAST  Result Date: 06/07/2019 CLINICAL DATA:  Low back/buttock/sacral pain with fever and abnormal CT  scan. Elevated white blood cell count. EXAM: MRI PELVIS WITHOUT AND WITH CONTRAST TECHNIQUE: Multiplanar multisequence MR imaging of the pelvis was performed both before and after administration of intravenous contrast. CONTRAST:  18m GADAVIST GADOBUTROL 1 MMOL/ML IV SOLN COMPARISON:  CT scan, same date. FINDINGS: There appears to be an open wound involving the right lower sacral area with fluid contacting the skin. The right gluteus maximus muscle is markedly thickened and demonstrates diffuse myositis. Small fluid collections are noted in the muscle 1 of which demonstrates rim enhancement after contrast consistent with pyomyositis. Do not see a well-formed discrete drainable soft tissue abscess but there is significant focal cellulitis involving the subcutaneous tissues in the right buttock area overlying the gluteus maximus. Abnormal T2 signal intensity and enhancement in the lower sacral segments (3, 4 and 5) is highly suspicious for osteomyelitis. The coccyx segments appear intact. Both hips are normally located. No findings for septic arthritis, osteomyelitis or AVN. The pubic symphysis and SI joints are intact. No evidence of septic arthritis. Mild inflammation/edema in the right adductor muscles suggesting myositis without evidence of pyomyositis. No significant intrapelvic abnormalities are identified. The bladder is grossly normal and the uterus and ovaries are unremarkable. There is a small amount of free pelvic fluid and moderate presacral edema. No rim enhancing intrapelvic abscess is identified. IMPRESSION: 1. Suspect small open wound at the very top of the gluteal fold on the right with underlying focal cellulitis, myofasciitis involving the right gluteus maximus muscle and a small focus of pyomyositis. No drainable abscess is identified. 2. Findings suspicious for sacral osteomyelitis. 3.  No findings for septic arthritis and no intrapelvic abscess. Electronically Signed   By: Marijo Sanes M.D.   On:  06/07/2019 09:20   US Venous Img Lower Bilateral (DVT)  Result Date: 06/21/2019 CLINICAL DATA:  Bilateral lower extremity edema. EXAM: BILATERAL LOWER EXTREMITY VENOUS DOPPLER ULTRASOUND TECHNIQUE: Gray-scale sonography with graded compression, as well as color Doppler and duplex ultrasound were performed to evaluate the lower extremity deep venous systems from the level of the common femoral vein and including the common femoral, femoral, profunda femoral, popliteal and calf veins including the posterior tibial, peroneal and gastrocnemius veins when visible. The superficial great saphenous vein was also interrogated. Spectral Doppler was utilized to evaluate flow at rest and with distal augmentation maneuvers in the common femoral, femoral and popliteal veins. COMPARISON:  None. FINDINGS: RIGHT LOWER EXTREMITY Common Femoral Vein: No evidence of thrombus. Normal compressibility, respiratory phasicity and response to augmentation. Saphenofemoral Junction: No evidence of thrombus. Normal compressibility and flow on color Doppler imaging. Profunda Femoral Vein: No evidence of thrombus. Normal compressibility and flow on color Doppler imaging. Femoral Vein: No evidence of thrombus. Normal compressibility, respiratory phasicity and response to augmentation. Popliteal Vein: No evidence of thrombus. Normal compressibility, respiratory phasicity and response to augmentation. Calf Veins: No evidence of thrombus. Normal compressibility and flow on color Doppler imaging. Superficial Great Saphenous Vein: No evidence of thrombus. Normal compressibility. Venous Reflux:  None. Other Findings: No evidence of superficial thrombophlebitis or abnormal fluid collection. Mildly prominent right inguinal lymph nodes with the largest measuring approximately 1.3 cm in short axis. These are likely reactive nodes. LEFT LOWER EXTREMITY Common Femoral Vein: No evidence of thrombus. Normal compressibility, respiratory phasicity and  response to augmentation. Saphenofemoral Junction: No evidence of thrombus. Normal compressibility and flow on color Doppler imaging. Profunda Femoral Vein: No evidence of thrombus. Normal compressibility and flow on color Doppler imaging. Femoral Vein: No evidence of thrombus. Normal compressibility, respiratory phasicity and response to augmentation. Popliteal Vein: No evidence of thrombus. Normal compressibility, respiratory phasicity and response to augmentation. Calf Veins: No evidence of thrombus. Normal compressibility and flow on color Doppler imaging. Superficial Great Saphenous Vein: No evidence of thrombus. Normal compressibility. Venous Reflux:  None. Other Findings: No evidence of superficial thrombophlebitis or abnormal fluid collection. IMPRESSION: No evidence of deep venous thrombosis in either lower extremity. Mildly prominent right inguinal lymph nodes. These are favored to be likely reactive lymph nodes. Electronically Signed   By: Aletta Edouard M.D.   On: 06/21/2019 12:10   DG Chest Portable 1 View  Result Date: 06/20/2019 CLINICAL DATA:  Picc line placement. Pt denies any on body medical injector devices. EXAM: PORTABLE CHEST 1 VIEW COMPARISON:  Chest radiograph 04/15/2017 FINDINGS: Interval placement of a right upper extremity PICC line with tip projecting over the distal SVC. The heart size and mediastinal contours are within normal limits given AP technique. The lungs are clear. No pneumothorax or large pleural effusion. No acute finding in the visualized skeleton. IMPRESSION: Right upper extremity PICC line with tip projecting over the distal SVC. Electronically Signed   By: Audie Pinto M.D.   On: 06/20/2019 18:09   [place] EKG SITE RITE  Result Date: 06/09/2019 If Site Rite image not attached, placement could not be confirmed due to current cardiac rhythm.   Microbiology: Recent Results (from the past 240 hour(s))  Respiratory Panel by RT PCR (Flu A&B, Covid) -  Nasopharyngeal Swab     Status: None   Collection Time: 06/20/19  6:54 PM  Specimen: Nasopharyngeal Swab  Result Value Ref Range Status   SARS Coronavirus 2 by RT PCR NEGATIVE NEGATIVE Final    Comment: (NOTE) SARS-CoV-2 target nucleic acids are NOT DETECTED. The SARS-CoV-2 RNA is generally detectable in upper respiratoy specimens during the acute phase of infection. The lowest concentration of SARS-CoV-2 viral copies this assay can detect is 131 copies/mL. A negative result does not preclude SARS-Cov-2 infection and should not be used as the sole basis for treatment or other patient management decisions. A negative result may occur with  improper specimen collection/handling, submission of specimen other than nasopharyngeal swab, presence of viral mutation(s) within the areas targeted by this assay, and inadequate number of viral copies (<131 copies/mL). A negative result must be combined with clinical observations, patient history, and epidemiological information. The expected result is Negative. Fact Sheet for Patients:  PinkCheek.be Fact Sheet for Healthcare Providers:  GravelBags.it This test is not yet ap proved or cleared by the [place] FDA and  has been authorized for detection and/or diagnosis of SARS-CoV-2 by FDA under an Emergency Use Authorization (EUA). This EUA will remain  in effect (meaning this test can be used) for the duration of the COVID-19 declaration under Section 564(b)(1) of the Act, 21 U.S.C. section 360bbb-3(b)(1), unless the authorization is terminated or revoked sooner.    Influenza A by PCR NEGATIVE NEGATIVE Final   Influenza B by PCR NEGATIVE NEGATIVE Final    Comment: (NOTE) The Xpert Xpress SARS-CoV-2/FLU/RSV assay is intended as an aid in  the diagnosis of influenza from Nasopharyngeal swab specimens and  should not be used as a sole basis for treatment. Nasal washings and  aspirates  are unacceptable for Xpert Xpress SARS-CoV-2/FLU/RSV  testing. Fact Sheet for Patients: PinkCheek.be Fact Sheet for Healthcare Providers: GravelBags.it This test is not yet approved or cleared by the [place] FDA and  has been authorized for detection and/or diagnosis of SARS-CoV-2 by  FDA under an Emergency Use Authorization (EUA). This EUA will remain  in effect (meaning this test can be used) for the duration of the  Covid-19 declaration under Section 564(b)(1) of the Act, 21  U.S.C. section 360bbb-3(b)(1), unless the authorization is  terminated or revoked. Performed at [facility], [address] [place], [place] [address]   Respiratory Panel by RT PCR (Flu A&B, Covid) -     Status: None   Collection Time: 06/26/19  6:28 PM  Result Value Ref Range Status   SARS Coronavirus 2 by RT PCR NEGATIVE NEGATIVE Final    Comment: (NOTE) SARS-CoV-2 target nucleic acids are NOT DETECTED. The SARS-CoV-2 RNA is generally detectable in upper respiratoy specimens during the acute phase of infection. The lowest concentration of SARS-CoV-2 viral copies this assay can detect is 131 copies/mL. A negative result does not preclude SARS-Cov-2 infection and should not be used as the sole basis for treatment or other patient management decisions. A negative result may occur with  improper specimen collection/handling, submission of specimen other than nasopharyngeal swab, presence of viral mutation(s) within the areas targeted by this assay, and inadequate number of viral copies (<131 copies/mL). A negative result must be combined with clinical observations, patient history, and epidemiological information. The expected result is Negative. Fact Sheet for Patients:  PinkCheek.be Fact Sheet for Healthcare Providers:  GravelBags.it This test is not yet ap proved or cleared by the  [place] FDA and  has been authorized for detection and/or diagnosis of SARS-CoV-2 by FDA under an Emergency Use Authorization (EUA).  This EUA will remain  in effect (meaning this test can be used) for the duration of the COVID-19 declaration under Section 564(b)(1) of the Act, 21 U.S.C. section 360bbb-3(b)(1), unless the authorization is terminated or revoked sooner.    Influenza A by PCR NEGATIVE NEGATIVE Final   Influenza B by PCR NEGATIVE NEGATIVE Final    Comment: (NOTE) The Xpert Xpress SARS-CoV-2/FLU/RSV assay is intended as an aid in  the diagnosis of influenza from Nasopharyngeal swab specimens and  should not be used as a sole basis for treatment. Nasal washings and  aspirates are unacceptable for Xpert Xpress SARS-CoV-2/FLU/RSV  testing. Fact Sheet for Patients: PinkCheek.be Fact Sheet for Healthcare Providers: GravelBags.it This test is not yet approved or cleared by the [place] FDA and  has been authorized for detection and/or diagnosis of SARS-CoV-2 by  FDA under an Emergency Use Authorization (EUA). This EUA will remain  in effect (meaning this test can be used) for the duration of the  Covid-19 declaration under Section 564(b)(1) of the Act, 21  U.S.C. section 360bbb-3(b)(1), unless the authorization is  terminated or revoked. Performed at [facility], [address], [place], [place] [address]      Labs: Basic Metabolic Panel: Recent Labs  Lab 06/23/19 0606 06/23/19 0606 06/24/19 6606 06/24/19 3016 06/25/19 0800 06/26/19 0551 06/27/19 0615 06/28/19 0935 06/29/19 0418  NA 139   < > 140   < > 139 141 143 143 140  K 4.1   < > 4.1   < > 4.0 4.1 4.4 4.6 4.9  CL 109   < > 111   < > 111 112* 114* 114* 110  CO2 20*   < > 21*   < > 21* 22 21* 19* 22  GLUCOSE 86   < > 79   < > 83 90 86 110* 82  BUN 23*   < > 21*   < > 21* 20 22* 27* 31*  CREATININE 2.60*   < > 2.54*   < > 2.85* 2.52* 2.36*  2.41* 2.20*  CALCIUM 8.5*   < > 8.5*   < > 8.5* 8.4* 8.6* 9.0 8.6*  MG 1.9  --  1.8  --  1.7 1.6* 1.8  --   --   PHOS  --   --   --   --   --  4.4  --   --   --    < > = values in this interval not displayed.   Liver Function Tests: Recent Labs  Lab 06/23/19 0606 06/24/19 0635 06/26/19 0551  AST 11* 10*  --   ALT 10 9  --   ALKPHOS 202* 196*  --   BILITOT 0.5 0.4  --   PROT 5.9* 5.8*  --   ALBUMIN 1.4* 1.4* 1.4*   CBC: Recent Labs  Lab 06/24/19 0635 06/25/19 0800 06/26/19 0551 06/27/19 0615 06/29/19 0418  WBC 8.2 8.8 7.6 7.2 8.7  HGB 7.9* 8.1* 7.5* 8.9* 8.3*  HCT 26.2* 25.7* 24.4* 28.1* 27.1*  MCV 86.5 86.0 85.9 85.9 86.9  PLT 319 317 294 268 244   CBG: Recent Labs  Lab 06/28/19 1137 06/28/19 1641 06/28/19 2052 06/29/19 0722 06/29/19 1109  GLUCAP 83 137* 88 76 140*    Signed:  Barton Dubois MD.  [facility] 06/29/2019, 3:26 PM

## 2019-06-29 NOTE — Progress Notes (Signed)
Patient discharged home with picc line in place to receive iv antibiotics at home.

## 2019-06-29 NOTE — Progress Notes (Signed)
Nsg Discharge Note  Admit Date:  06/20/2019 Discharge date: 06/29/2019   Lindsey Murillo to be D/C'd Home per MD order.  AVS completed.  Copy for chart, and copy for patient signed, and dated. Patient/caregiver able to verbalize understanding.  Discharge Medication: Allergies as of 06/29/2019   No Known Allergies     Medication List    STOP taking these medications   bisacodyl 10 MG suppository Commonly known as: DULCOLAX   cefTRIAXone  IVPB Commonly known as: ROCEPHIN   ibuprofen 200 MG tablet Commonly known as: ADVIL   polyethylene glycol 17 g packet Commonly known as: MIRALAX / GLYCOLAX   vancomycin  IVPB     TAKE these medications   Accu-Chek Aviva device Use as instructed   Accu-Chek Guide test strip Generic drug: glucose blood Use to test blood sugar 4 times per day.   ertapenem  IVPB Commonly known as: INVANZ Inject 1 g into the vein daily for 19 days. Indication:  Sacral osteomyelitis and diabetic foot infection Last Day of Therapy:  07/17/19 Labs - Once weekly:  CBC/D and BMP, Labs - Every other week:  ESR and CRP   feeding supplement (GLUCERNA SHAKE) Liqd Take 237 mLs by mouth 2 (two) times daily between meals.   feeding supplement (PRO-STAT SUGAR FREE 64) Liqd Take 30 mLs by mouth 2 (two) times daily.   FreeStyle Libre 14 Day Sensor Misc Inject 1 each into the skin every 14 (fourteen) days. Use as directed.   glucagon 1 MG injection Follow package directions for low blood sugar. What changed:   how much to take  how to take this  when to take this  reasons to take this   insulin glargine 100 UNIT/ML injection Commonly known as: LANTUS Inject 0.15 mLs (15 Units total) into the skin at bedtime.   lipase/protease/amylase 36000 UNITS Cpep capsule Commonly known as: CREON Take 1 capsule (36,000 Units total) by mouth with snacks.   lipase/protease/amylase 36000 UNITS Cpep capsule Commonly known as: CREON Take 2 capsules (72,000 Units  total) by mouth 3 (three) times daily with meals.   magnesium oxide 400 MG tablet Commonly known as: MAG-OX Take 1 tablet (400 mg total) by mouth daily.   Metoprolol Tartrate 37.5 MG Tabs Take 37.5 mg by mouth 2 (two) times daily. What changed:   medication strength  how much to take   NovoLOG FlexPen 100 UNIT/ML FlexPen Generic drug: insulin aspart Inject 8-11 Units into the skin 3 (three) times daily with meals.   pantoprazole 40 MG tablet Commonly known as: PROTONIX Take 1 tablet (40 mg total) by mouth daily before breakfast. Start taking on: June 30, 2019   traMADol 50 MG tablet Commonly known as: ULTRAM Take 1 tablet (50 mg total) by mouth every 8 (eight) hours as needed for moderate pain or severe pain.   Tylenol 8 Hour Arthritis Pain 650 MG CR tablet Generic drug: acetaminophen Take 650 mg by mouth every 8 (eight) hours as needed for pain.   Vitamin D (Ergocalciferol) 1.25 MG (50000 UNIT) Caps capsule Commonly known as: DRISDOL Take 1 capsule (50,000 Units total) by mouth every 7 (seven) days.            Home Infusion Instuctions  (From admission, onward)         Start     Ordered   06/29/19 0000  Home infusion instructions    Question:  Instructions  Answer:  Flushing of vascular access device: 0.9% NaCl pre/post medication administration  and prn patency; Heparin 100 u/ml, 22m for implanted ports and Heparin 10u/ml, 565mfor all other central venous catheters.   06/29/19 1336          Discharge Assessment: Vitals:   06/29/19 0511 06/29/19 0955  BP: (!) 149/99 (!) 152/104  Pulse: 91 88  Resp: 18   Temp: 98.9 F (37.2 C)   SpO2:     Skin clean, dry and intact without evidence of skin break down, no evidence of skin tears noted. IV catheter discontinued intact. Site without signs and symptoms of complications - no redness or edema noted at insertion site, patient denies c/o pain - only slight tenderness at site.  Dressing with slight pressure  applied.  D/c Instructions-Education: Discharge instructions given to patient/family with verbalized understanding. D/c education completed with patient/family including follow up instructions, medication list, d/c activities limitations if indicated, with other d/c instructions as indicated by MD - patient able to verbalize understanding, all questions fully answered. Patient instructed to return to ED, call 911, or call MD for any changes in condition.  Patient escorted via [place]C home via private auto.  RaZenaida DeedRN 06/29/2019 2:47 PM

## 2019-06-29 NOTE — Care Management (Signed)
Patient discharging home today. Mickel Baas of [facility] notified, new orders sent. [facility] notified. Resumption order placed, needs to be signed. LInda of [facility] notified of DC.

## 2019-06-29 NOTE — Patient Outreach (Signed)
  [place] [facility]) Care Management  06/29/2019  [place] 07-Feb-1996 [id number]   [facility] referral received, Not active as remains hospitalized 06/29/19   Referral Date: 06/26/19  Referral Source: insurance, [place] Date of Admission: 06/20/19 Diagnosis: Acute Kidney Injury, [facility], ID [id number] 24 yo adm on 1/27 due to sepsis secondary to cellulitis with underlying osteomyelitis admission 06/06/19 and readmission 2/10   Facility: [place]: [place]    Conditions: Acute Kidney Injury, sepsis secondary to cellulitis with underlying osteomyelitis- sacral region, Diabetes type 1 (05/09/19 HgA1c=9.3 was >15.5 on 12/13/18, as high as 16.2 on 05/09/2015), Asthma, Chronic diarrhea, chronic  multifocal osteomyelitis of right foot, diabetic autonomic peripheral neuropathy, dysphagia, essential benign hypertension, goiter, history of noncompliance, history of hypoglycemia, normocytic anemia, streptococcal bacteremia, tachycardia, autoimmune thyroiditis, history of vancomycin0induced nephrotoxicity, vitamin D deficiency   DME Accu chek glucose, glucerna supplement, protstat sugar free supplement  Appointments  06/20/19 Dr Baltazar Apo, Primary care provider office visit 07/17/19 1115 Infectious Disease Dr Michel Bickers - New consult  Gastroenterology needs 6-8 weeks follow up   Plan: [facility] RN CM will collaborate with [facility] hospital liaison pending patient discharge date and contact patient for [facility] community outreach prn  [facility] RN CM sent messages to [place], and [facility] hospital liaison Collaborated with [place] staff   [place]. Lavina Hamman, RN, BSN, [place] Management Care Coordinator Direct Number [phone] Mobile number [phone]  Main [facility] number [phone] Fax number [phone]

## 2019-06-29 NOTE — Progress Notes (Signed)
Subjective: Patient with some liquid and solid stool yesterday (received Creon sometime after her meal). No further hematochezia or melena. Some "bubblling in my stomach" today. No overt abdominal pain, N/V. No other GI complaints. Anticipates she'll be d/c to home today.   Objective: Vital signs in last 24 hours: Temp:  [98 F (36.7 C)-98.9 F (37.2 C)] 98.9 F (37.2 C) (02/19 0511) Pulse Rate:  [88-91] 91 (02/19 0511) Resp:  [18] 18 (02/19 0511) BP: (148-169)/(99-116) 149/99 (02/19 0511) SpO2:  [90 %-99 %] 99 % (02/18 2053) Last BM Date: 06/28/19 General:   Alert and oriented, pleasant Head:  Normocephalic and atraumatic. Eyes:  No icterus, sclera clear. Conjuctiva pink.  Heart:  S1, S2 present, no murmurs noted.  Lungs: Clear to auscultation bilaterally, without wheezing, rales, or rhonchi.  Abdomen:  Bowel sounds present, soft, non-tender, non-distended. No HSM or hernias noted. No rebound or guarding. No masses appreciated  Msk:  Symmetrical without gross deformities. Extremities:  Without clubbing or edema. Neurologic:  Alert and  oriented x4;  grossly normal neurologically. Psych:  Alert and cooperative. Normal mood and affect.  Intake/Output from previous day: 02/18 0701 - 02/19 0700 In: [id number] [P.O.:1380; IV Piggyback:200] Out: 250 [Urine:250] Intake/Output this shift: No intake/output data recorded.  Lab Results: Recent Labs    06/27/19 0615 06/29/19 0418  WBC 7.2 8.7  HGB 8.9* 8.3*  HCT 28.1* 27.1*  PLT 268 244   BMET Recent Labs    06/27/19 0615 06/28/19 0935 06/29/19 0418  NA 143 143 140  K 4.4 4.6 4.9  CL 114* 114* 110  CO2 21* 19* 22  GLUCOSE 86 110* 82  BUN 22* 27* 31*  CREATININE 2.36* 2.41* 2.20*  CALCIUM 8.6* 9.0 8.6*   LFT No results for input(s): PROT, ALBUMIN, AST, ALT, ALKPHOS, BILITOT, BILIDIR, IBILI in the last 72 hours. PT/INR No results for input(s): LABPROT, INR in the last 72 hours. Hepatitis Panel No results for input(s):  HEPBSAG, HCVAB, HEPAIGM, HEPBIGM in the last 72 hours.   Studies/Results: No results found.  Assessment: 24 y/o with diabetic autonomic/peripheral neuropathy, asthma, Htn, medical noncompliance, chronic diarrhea, sacral osteomyelitis seen for anemia and heme + stool.   Anemia/heme positive stool: Normocytic anemia first noted 04/2019. Hgb 8-9 range. Drop during recent admission to 6.3 and received one unit of prbcs. Hgb 8.5 at discharge. Hgb this admission dropped from 9.8 to 7.5. stool heme positive but no overt gi bleeding. EGD this admission with focal abnormality of gastric mucosa likely due to trauma (heaving), s/p bx which found gastritis. Normal duodenum and esophagus. S/p esophageal dilation. Mild drift in hgb today 8.9 > 8.3.  Dysphagia: normal esophagus on EGD. S/p empiric dilation.   Chronic diarrhea: baseline 7 watery stools daily. Previously did improve with pancreatic enzymes therefore suspected pancreatic insufficiency and/or diabetic enteropathy. Cdiff ordered but still not collected. Spoke with Crystal (nursing) yesterday, no report of diarrhea in past 24 hours. Patient reported solid stool night before last.  Advised to collect cdiff if recurrent diarrhea. No significant  recurrent diarrhea since admission and starting Creon. Plan outpatient colonoscopy   Plan: 1. Follow hgb 2. Transfuse as necessary 3. Notify of any significant bleed 4. Collect CDiff with any diarrhea stools 5. Supportive measures 6. Outpatient follow-up with GI 7. Discharge on Creon: 72k Units/meal, 36K Units/snack 8. Anticipate d/c in next 24-48 hours   Thank you for allowing [place] to participate in the care of Lindsey Murillo, [place],  AGNP-C Adult & Gerontological Nurse Practitioner [facility]    LOS: 9 days    06/29/2019, 9:32 AM

## 2019-06-29 NOTE — Telephone Encounter (Signed)
Please schedule hosp-f/u in 6-8 weeks with any APP.

## 2019-06-29 NOTE — Progress Notes (Signed)
PHARMACY CONSULT NOTE FOR:  OUTPATIENT  PARENTERAL ANTIBIOTIC THERAPY (OPAT)  Indication: Sacral osteomyelitis and diabetic foot infection Regimen: Ertapenem 1000 mg IV every 24 hours if CrCl stays above 30 mL/min. End date: 07/18/19  IV antibiotic discharge orders are pended. To discharging provider:  please sign these orders via discharge navigator,  Select New Orders & click on the button choice - Manage This Unsigned Work.     Thank you for allowing pharmacy to be a part of this patient's care.  Isac Sarna, BS Vena [place], BCPS Clinical Pharmacist Pager [phone] 06/29/2019, 1:13 PM

## 2019-07-02 ENCOUNTER — Telehealth: Payer: Self-pay | Admitting: Gastroenterology

## 2019-07-02 ENCOUNTER — Telehealth: Payer: Self-pay | Admitting: Family Medicine

## 2019-07-02 ENCOUNTER — Other Ambulatory Visit: Payer: Self-pay | Admitting: *Deleted

## 2019-07-02 ENCOUNTER — Other Ambulatory Visit: Payer: Self-pay

## 2019-07-02 NOTE — Telephone Encounter (Signed)
Verbal order given to [facility]

## 2019-07-02 NOTE — Patient Outreach (Signed)
[place] [facility]) Care Management  07/02/2019  [place] [facility] [birth year] [id number]   [facility] referral received, Not active as remains hospitalized 06/29/19  Referral Date: 06/26/19  Referral Source: insurance, [place] Date of Admission: 06/20/19 Diagnosis: Acute Kidney Injury, [facility], ID [id number] 24 yo adm on 1/27 due to sepsis secondary to cellulitis with underlying osteomyelitis admission 06/06/19 and readmission 2/10  Facility: [place]: [place] attempt # 1 to her home/mobiel number for the Insurance referred patient No answer. TH[facility] RN CM left HIPAA [facility] Portability and Accountability Act) compliant voicemail message along with CM's contact info.     Conditions: Acute Kidney Injury, sepsis secondary to cellulitis with underlying osteomyelitis- sacral region, Diabetes type 1 (05/09/19 HgA1c=9.3 was >15.5 on 12/13/18, as high as 16.2 on 05/09/2015), Asthma, Chronic diarrhea, chronic  multifocal osteomyelitis of right foot, diabetic autonomic peripheral neuropathy, dysphagia, essential benign hypertension, goiter, history of noncompliance, history of hypoglycemia, normocytic anemia, streptococcal bacteremia, tachycardia, autoimmune thyroiditis, history of vancomycin0induced nephrotoxicity, vitamin D deficiency   DME Accu chek glucose, glucerna supplement, protstat sugar free supplement  Appointments  06/20/19 Dr Baltazar Apo, Primary care provider office visit 07/17/19 1115 Infectious Disease Dr Michel Bickers - New consult  Gastroenterology needs 6-8 weeks follow up   Plan: [facility] RN CM sent an unsuccessful outreach letter and scheduled this patient for another call attempt within 4 business days   Yaasir Menken L. Lavina Hamman, RN, BSN, [place] Management Care Coordinator Direct Number [phone] Mobile number [phone]  Main [facility] number [phone] Fax number [phone]

## 2019-07-02 NOTE — Patient Outreach (Addendum)
[place] [facility]) Care Management  07/02/2019  Lindsey Murillo [facility] 05-Mar-1996 [id number]   [facility] assessment of [facility] plan referred patient  Referral Date:06/26/19 Referral Source:insurance, [facility] Date of Admission:06/20/19 Diagnosis:Acute Kidney Injury,[facility], ID [id number] 24 yo adm on 1/27 due to sepsis secondary to cellulitis with underlying osteomyelitis admission 06/06/19 and readmission 2/10 [place] returned [facility] RN CM's call  Patient is able to verify HIPAA ([place] and Accountability Act) identifiers, date of birth (DOB) and address Reviewed and addressed the purpose of the follow up call with the patient  Consent: [facility] RN CM reviewed [facility] services with patient. Patient gave verbal consent for services.  Ms Cassity reports she is doing well She reports she is presently out of the home with her family. She is sitting in the car as her family has gone in a store. She reports having her home health antibiotics today She reports she has follow up visits to her nephrologist and gastroenterologist She reports she is anticipating receiving a call from her primary care provider's (PCP) office  on 07/03/19. She reports if she does not receive a call she will call the pcp office without assist of [facility] RN CM. She confirms she does have access to [email or web address] [facility] RN CM reviewed Epic Transition of care questions Ms Korpi confirms home health IV antibiotic services ongoing    Diabetes She reports being diagnosed with Diabetes type one at age 23, 40 years ago. She reports her HgA1c was 15 but is now 9.3  She does not have an insulin pump but states "we are working on it" She is not listed with active endocrinology services but is noted to have endocrinology services vis [facility] in 2018  She agreed to follow up in 7-14  business days  Social Ms Benedick is a 24 year old female who lives at home with her family to include her mother and brother. She also gets support from her cousins. She reports her mother or brother assists with getting her to her medical appointments. She states she manages her  Activities of daily living (ADLs) and gets some assist from family for some  IADLs (instrumental Activities of Daily Living).  Conditions: Acute Kidney Injury, sepsis secondary to cellulitis with underlying osteomyelitis- sacral region, Diabetes type 1 (05/09/19 HgA1c=9.3 was >15.5 on 12/13/18, as high as 16.2 on 05/09/2015), Asthma, Chronic diarrhea, chronic multifocal osteomyelitis of right foot, diabetic autonomic peripheral neuropathy, dysphagia, essential benign hypertension, goiter, history of noncompliance, history of hypoglycemia, normocytic anemia, streptococcal bacteremia, tachycardia, autoimmune thyroiditis, history of vancomycin, induced nephrotoxicity, vitamin D deficiency, never smoker   DME Accu chek glucose, Glucerna supplement, protstat sugar free supplement  Appointments 06/20/19 Dr Baltazar Apo, Primary care provider office visit seen, pending call for hospital follow up  38/21 Nephrologist  07/17/19 1115 Infectious Disease Dr Michel Bickers -New consult  08/22/19 1000 Gastroenterology Neil Crouch PA   Plan: [facility] RN CM will follow up with Ms [facility] within the next 7-14 business days Pt encouraged to return a call to [facility] RN CM prn  [facility] RN CM sent a successful outreach letter as discussed with [facility] brochure enclosed for review  Routed note to MDs/NP/PA MD involvement barriers letter sent to Dr Wolfgang [place]    [facility] CM Care Plan Problem One     Most Recent Value  Care Plan Problem One  Risk for hospital readmission secondary to recently diagnosed sepsis  secondary to cellulitis with underlying osteomyelitis, AKI, DM type 1  Role Documenting the Problem One  Care Management Telephonic Coordinator   Care Plan for Problem One  Active  [facility]N Long Term Goal   Over the next 60 days, patient will not be hospitalized for complications related to chronic illnesses.  THN Long Term Goal Start Date  07/02/19  Interventions for Problem One Long Term Goal  Discussed with patient plans to schedule hospital follow up appointments with nephrologist and PCP, encouraged patient to schedule appointments promptly, assessed HgA1c value and cbg values  THN CM Short Term Goal #1   Over the next 31 days, patient will not experience hospital readmission, as evidence by patient reporting and review of EMR during [facility] RN CCM outreach  [facility] CM Short Term Goal #1 Start Date  07/02/19  Interventions for Short Term Goal #1  Discussed with patient plans to schedule hospital follow up appointments with cardiologist and PCP, encouraged patient to schedule appointments promptly  THN CM Short Term Goal #2   Over the next 7 days, patient will schedule hospital follow up office visit appointments with nephrologist and PCP, as evidenced by patient reporting during [place] outreach  [facility] CM Short Term Goal #2 Start Date  07/02/19  Interventions for Short Term Goal #2  Discussed with patient plans to schedule hospital follow up appointments with cardiologist and PCP, encouraged patient to schedule appointments promptly       Angelino Rumery L. Lavina Hamman, RN, BSN, [place] Management Care Coordinator Direct Number [phone] Mobile number [phone]  Main THN number [phone] Fax number [phone]

## 2019-07-02 NOTE — Telephone Encounter (Signed)
yes

## 2019-07-02 NOTE — Telephone Encounter (Signed)
[facility] [place] calling to resume skilled nursing 1 x 4 weeks   CB# [phone]

## 2019-07-02 NOTE — Telephone Encounter (Signed)
PATIENT ASKED IF WE COULD SEND HER CREON PRESCRIPTION TO [facility], THEY ARE USUALLY CHEAPER THAN HER USUAL PHARMACY AND THEY SAID THE CREON WAS GOING TO BE [id number]

## 2019-07-02 NOTE — Telephone Encounter (Signed)
Called patient and she is scheduled.

## 2019-07-02 NOTE — Telephone Encounter (Signed)
Pt needs Creon, but said it would cost $2000 to get. Please advise if we have coupons, discount cards or samples. [phone]

## 2019-07-03 NOTE — Telephone Encounter (Signed)
I am leaving 2 boxes of Creon 36,000 units at front for pt ( #24 tablets).   Also, leaving a savings coupon.  See other phone note, to have Rx for Creon sent to [facility].

## 2019-07-03 NOTE — Telephone Encounter (Signed)
Forwarding to Dr. Oneida Alar to send in the Rx.  See separate note I have left samplesof Creon and a savings coupon at front for pt.

## 2019-07-03 NOTE — Telephone Encounter (Signed)
REVIEWED. CALL RX INTO [facility].

## 2019-07-03 NOTE — Telephone Encounter (Signed)
LMOM to call.

## 2019-07-03 NOTE — Telephone Encounter (Signed)
REVIEWED-NO ADDITIONAL RECOMMENDATIONS. 

## 2019-07-04 ENCOUNTER — Ambulatory Visit: Payer: 59 | Admitting: *Deleted

## 2019-07-05 ENCOUNTER — Other Ambulatory Visit: Payer: Self-pay | Admitting: *Deleted

## 2019-07-05 NOTE — Patient Outreach (Signed)
[place] [facility]) Care Management  07/05/2019  Demiya Schwitters [facility] 16-Apr-1996 [id number]   [place] coordination Completed case discussion with [facility] multidisciplinary team to include [facility] staff Recommendation continued [facility] follow up   Plan: [facility] RN CM will follow up with Ms Revard within the next 7-14 business days Pt encouraged to return a call to [facility] RN CM prn   Joelene Millin L. Lavina Hamman, RN, BSN, [place] Coordinator Office number [phone] Mobile number [phone]  Main [facility]N number [phone] Fax number [phone]

## 2019-07-06 ENCOUNTER — Encounter: Payer: Self-pay | Admitting: *Deleted

## 2019-07-10 ENCOUNTER — Other Ambulatory Visit: Payer: Self-pay | Admitting: *Deleted

## 2019-07-10 ENCOUNTER — Other Ambulatory Visit: Payer: Self-pay

## 2019-07-10 ENCOUNTER — Encounter: Payer: Self-pay | Admitting: *Deleted

## 2019-07-10 NOTE — Patient Outreach (Addendum)
[facility]) Care Management  07/10/2019  Lindsey Murillo [birth year] [id number]   THN assessment of [employer] plan referred patient  Referral Date:06/26/19 Referral Source:insurance, [employer] Date of Admission:06/20/19 Diagnosis:Acute Kidney Injury,[employer], ID DP:[id number] 24 yo adm on 1/27 due to sepsis secondary to cellulitis with underlying osteomyelitis admission 06/06/19 and readmission 2/10 [place]  Patient is able to verify HIPAA ([place] and Accountability Act) identifiers, date of birth (DOB) and address Reviewed and addressed the purpose of the follow up call with the patient  Follow up  Lindsey Murillo states she is improving but continues to be weak, "gets tired easily" and gives out of energy. She reports her mother is encouraging her to walk more as Lindsey Murillo states she has been doing more sitting  She denies shortness of breath (sob) and swelling She was encouraged to be more active.  During the call it was noted that she left her home with family providing transport.  She confirms the home health nurse changed her bandage today, provided IV therapy and labs were completed.  She reports she has not been to Dr Dorris Fetch, Endocrinology in "a couple of months" She reports she was told to Lindsey Murillo her cbg 3 times a day She states her cbg range recently has been 66-205 [facility] RN CM encouraged her to call to get an appointment. THN RN CM discussed diabetes management to assist with healing She voiced understanding and stated she would make an appointment  Epic lists Asthma She reports she uses her inhalers without concerns  Social Lindsey Murillo is a 23 year old female who lives at home with her family to include her mother and brother. She also gets support from her cousins. She reports her mother or brother assists with getting her to her medical appointments. She states she manages her   Activities of daily living (ADLs) and gets some assist from family for some  IADLs (instrumental Activities of Daily Living).  Conditions: Acute Kidney Injury, sepsis secondary to cellulitis with underlying osteomyelitis- sacral region, Diabetes type 1 (05/09/19 HgA1c=9.3 was >15.5 on 12/13/18, as high as 16.2 on 05/09/2015), Asthma, Chronic diarrhea, chronic multifocal osteomyelitis of right foot, diabetic autonomic peripheral neuropathy, dysphagia, essential benign hypertension, goiter, history of noncompliance, history of hypoglycemia, normocytic anemia, streptococcal bacteremia, tachycardia, autoimmune thyroiditis, history of vancomycin, induced nephrotoxicity, vitamin D deficiency, never smoker  Falls She denies falls   DME Accu chek glucose, Glucerna supplement, protstat sugar free supplement  Appointments 07/11/19 Dr Baltazar Apo, Primary care provider- last seen 06/20/19 38/21 urology [place] 07/17/19 1115 Infectious Disease Dr Michel Bickers -New consult  07/25/19 Dentist for chip teeth x 2  08/22/19 1000 Gastroenterology Neil Crouch PA   Plan: [facility] RN CM will follow up with Lindsey [facility] Murillo within the next 7-14 business days Pt encouraged to return a call to [facility] RN CM prn  Routed note to MD/PA/NP  [facility] CM Care Plan Problem One     Most Recent Value  Care Plan Problem One  Risk for hospital readmission secondary to recently diagnosed sepsis secondary to cellulitis with underlying osteomyelitis, AKI, DM type 1  Role Documenting the Problem One  Care Management Telephonic Coordinator  Care Plan for Problem One  Active  [facility] Long Term Goal   Over the next 60 days, patient will not be hospitalized for complications related to chronic illnesses.DM, HTn, asthma  THN Long Term Goal Start Date  07/02/19  Interventions for  Problem One Long Term Goal  assessed for worsening symptoms Assessed for home care services and management, encouraged to make appointment for  endocrinologist  [facility] CM Short Term Goal #1   Over the next 31 days, patient will not experience hospital readmission, as evidence by patient reporting and review of EMR during [facility] RN CCM outreach  [facility] CM Short Term Goal #1 Start Date  07/02/19  Interventions for Short Term Goal #1  assessed for worsening symptoms Assessed for home care services and management  [facility] CM Short Term Goal #2   Over the next 7 days, patient will schedule hospital follow up office visit appointments with nephrologist and PCP, as evidenced by patient reporting during [facility] RN CCM outreach  [facility] CM Short Term Goal #2 Start Date  07/02/19  Interventions for Short Term Goal #2  assessed for scheduled follow up appointments, Encouraged to make endocrinology appointment      [place]. Lavina Hamman, RN, BSN, [place] Coordinator Office number [phone] Mobile number [phone]  Main THN number [phone] Fax number [phone]

## 2019-07-11 ENCOUNTER — Telehealth: Payer: Self-pay | Admitting: *Deleted

## 2019-07-11 ENCOUNTER — Ambulatory Visit (INDEPENDENT_AMBULATORY_CARE_PROVIDER_SITE_OTHER): Payer: 59 | Admitting: Family Medicine

## 2019-07-11 ENCOUNTER — Encounter: Payer: Self-pay | Admitting: Family Medicine

## 2019-07-11 VITALS — BP 110/68 | Temp 97.2°F | Ht 69.0 in | Wt 108.0 lb

## 2019-07-11 DIAGNOSIS — D649 Anemia, unspecified: Secondary | ICD-10-CM | POA: Diagnosis not present

## 2019-07-11 DIAGNOSIS — N179 Acute kidney failure, unspecified: Secondary | ICD-10-CM | POA: Diagnosis not present

## 2019-07-11 DIAGNOSIS — M4628 Osteomyelitis of vertebra, sacral and sacrococcygeal region: Secondary | ICD-10-CM | POA: Diagnosis not present

## 2019-07-11 DIAGNOSIS — E108 Type 1 diabetes mellitus with unspecified complications: Secondary | ICD-10-CM

## 2019-07-11 NOTE — Telephone Encounter (Signed)
Lindsey Murillo is here for a follow up today and dr Richardson Landry luking wanted me to call her you about her. I do not have your phone number could you call our office [phone] or either give me a number to reach you about this patient

## 2019-07-11 NOTE — Telephone Encounter (Signed)
   Zareena Willis L. Lavina Hamman RN, BSN, [place] Coordinator   Office number [phone] Monday - Friday 0830-1700  Mobile number [phone]  Main THN number [phone] Fax number [phone]

## 2019-07-11 NOTE — Progress Notes (Signed)
   Subjective:    Patient ID: Lindsey Murillo, female    DOB: 15-Jun-1995, 24 y.o.   MRN: [id number]  HPIfollow up on recent hospitalization for osteomyelitis of sacrum. Pt states she is doing well and has no concerns today.   Patient reports overall her sugars are in better control.  She continues to work with the home health nurses.  Receiving her IV infusion of antibiotics.  Last 2 sets of blood work are available today for review.  Fortunately her creatinine is back to virtually normal at 1.05 with a normal GFR on the latest test.  Review of Systems No headache no chest pain no shortness of breath    Objective:   Physical Exam Alert active thin no acute distress lungs clear heart regular rate and rhythm no obvious tenderness over presacral region.  Ankles trace edema       Assessment & Plan:  Impression 1 acute renal failure nearly resolved.  Likely due to vancomycin plus dehydration.  2.  Type 1 diabetes with history of horrible compliance in the past.  Sugars overall improving  3.  Anemia.  Clear etiology is still not obviously figured out in my view.  Patient did have heme positive stools.  Had a upper endoscopy which revealed gastritis.  Ferritin within normal limits.  MCV also normal.  CBC 2 years ago looked fairly good.  Could be element also of chronic disease at this point with her chronic disease load.  4.  Osteomyelitis sacrum.  Patient remarkably was unaware that she has a infectious disease follow-up with Dr. Megan Salon in 6 days.  I informed her of this and have asked for the teaching coordinator to also make sure patient is aware.  We will recheck CBC in a couple weeks along with ferritin level.  Encouraged to take iron supplement rationale discussed.  Follow-up with both nephrologist and infectious disease specialist and endocrinologist as scheduled.  Diet exercise discussed  Greater than 50% of this 30 minute face to face visit was spent in counseling and  discussion and coordination of care regarding the above diagnosis/diagnosies

## 2019-07-12 NOTE — Telephone Encounter (Signed)
thx

## 2019-07-12 NOTE — Telephone Encounter (Signed)
Called and spoke with thn coordinator kim gibbs. I discussed with her that pt was unaware of her appt on 07/17/19 with dr Megan Salon and kim did say she notified the pt. I let kim know I also gave pt the information while in office yesterday. Appt with infectious disease dr Megan Salon. 3/9 at 11:15 for osteomyelitis. Address and phone number also wrote out on paper and gave to pt. [place] Pt states she may have to change since she was unaware but I did tell her it was very important to keep that appt and she verbalized understanding.

## 2019-07-13 ENCOUNTER — Other Ambulatory Visit: Payer: Self-pay | Admitting: *Deleted

## 2019-07-13 NOTE — Patient Outreach (Signed)
[facility]) Care Management  07/13/2019  Mikal Wisman [facility] 08/04/1995 [id number]   [place] coordination- upcoming Infectious disease (ID) MD appointment for osteomylitis  [facility] RN CM received a message on 07/11/19 from [place] at Dr Lance Sell office and replied to her message [facility] RN CM received a call from [place] on 07/12/19 stating Dr Wolfgang Phoenix voiced concern about Ms Stills not being aware of her upcoming Infectious disease appointment on 07/17/19 with Dr Megan Salon and the importance of Ms [facility] attending this appointment. Abigail Butts stated she gave the patient the appointment information   The appointment for Dr Megan Salon is listed on Ms Daponte's after summary discharge sheet The appointment has been reviewed with Ms [facility] during the [facility] RN CM outreaches on 07/02/19 and on 07/10/19. Ms Netzley had also shared she was to see an urologist on 07/16/19 on 07/10/19. Ms Dubberly reported to [facility] RN CM that her family would assist her with transportation to these appointments.  THN RN CM outreached to Ms Friel and to her mother today 07/13/19 No answer for each. THN RN CM left a HIPAA [employer] Portability and Accountability Act) compliant voicemail message along with CM's contact info.  THN RN CM left in the messages the outreach from Dr Wolfgang Phoenix office x 2 this week since her office follow up with Dr Wolfgang Phoenix on 07/11/19, the importance of her attending her 07/17/19 1115 infectious disease appointment with Dr Megan Salon at [place] and requested she or her mother return a call if assistance is needed to get to the 07/17/19 appointment. Left THN RN CM's availability  Plan [facility] RN CMwill follow up with Ms [place]withinthe next 7-14business days Pt encouraged to return a call to [place] CM prn  Routed note to MD/PA/NP   [place]. Lavina Hamman, RN, BSN, [place] Coordinator Office number [phone] Mobile number [phone]   Main THN number [phone] Fax number [phone]

## 2019-07-16 ENCOUNTER — Telehealth: Payer: Self-pay | Admitting: Family Medicine

## 2019-07-16 NOTE — Telephone Encounter (Signed)
Lindsey Murillo [facility] contacted and states pt had told her about the infectious disease specialist after she had called our office. Informed Sarah that provider would wait until tomorrow appt with specialist to see what they think. Sarah verbalized understanding.

## 2019-07-16 NOTE — Telephone Encounter (Signed)
Sarah with [facility] calling requesting approval to remove pts picc line.   CB# [phone]

## 2019-07-16 NOTE — Telephone Encounter (Signed)
Due to see infectious disease specialist tomorrow--would wait to make sure they think is ok

## 2019-07-16 NOTE — Telephone Encounter (Signed)
Please advise. Thank you

## 2019-07-17 ENCOUNTER — Other Ambulatory Visit: Payer: Self-pay

## 2019-07-17 ENCOUNTER — Other Ambulatory Visit: Payer: Self-pay | Admitting: *Deleted

## 2019-07-17 ENCOUNTER — Encounter: Payer: Self-pay | Admitting: Internal Medicine

## 2019-07-17 ENCOUNTER — Ambulatory Visit (INDEPENDENT_AMBULATORY_CARE_PROVIDER_SITE_OTHER): Payer: 59 | Admitting: Internal Medicine

## 2019-07-17 ENCOUNTER — Telehealth: Payer: Self-pay

## 2019-07-17 VITALS — BP 137/101 | HR 92 | Temp 97.7°F | Wt 109.0 lb

## 2019-07-17 DIAGNOSIS — M86371 Chronic multifocal osteomyelitis, right ankle and foot: Secondary | ICD-10-CM | POA: Diagnosis not present

## 2019-07-17 NOTE — Telephone Encounter (Signed)
[place] in [place] for labs to be faxed to office. Spoke with Lelon Frohlich who will labs to triage. [phone]  Aundria Rud, CMA

## 2019-07-17 NOTE — Telephone Encounter (Signed)
Called [facility] with verbal order to pull picc. Spoke with Anderson Malta, case worker who will relay orders to home health. [place]

## 2019-07-17 NOTE — Patient Outreach (Signed)
[facility]) Care Management  07/17/2019  Manvir Thorson [facility] 1996-03-05 [id number]   THNoutreach to [place] referred patient Referral Date:06/26/19 Referral Source:insurance, [employer] Date of Admission:06/20/19 Diagnosis:Acute Kidney Injury,[employer], ID [id number] 24 yo adm on 1/27 due to sepsis secondary to cellulitis with underlying osteomyelitis admission 06/06/19 and readmission 2/10 [place]  Patient is able to verify HIPAA ([place] and Accountability Act) identifiers, date of birth (DOB) and address Reviewed and addressed the purpose of the follow up call with the patient  Follow up   No return call from patient nor her mother after voice messages left for both related to attending 07/17/19   Ms Lessner confirms she did complete the office visit with Dr Megan Salon, Infectious disease provider on 07/17/19 She confirms her antibiotics were discontinues and she will be getting her PICC out on 07/18/19   She reports she still remains weak but is taking Glucerna shakes prn.   She states she has not made the discussed appointment with Dr Dorris Fetch, endocrinologist She states she "call during lunch time' and was not able to reach staff. She was encouraged again to contact her endocrinologist for a follow up appointment She voiced understanding   [facility] RN CM reviewed the listed April appointments in [place] with her  As following 08/22/19 [place] gastro[facility]ology with PA, Neil Crouch 08/29/19 1615 6 week follow up with Dr Megan Salon, infectious disease Pt also confirms she has access to mychart to review her appointments  She denies issues today with concerns, needs for care coordination with diabetes or educational needs   Ms Dossantos reviewed the purpose of advance directives  She reports she would like to have advance directive forms THN RN CM had THN CMA to mail the forms  to Ms [facility] RN CM will follow up with her to answer questions prn    [facility] RN CM will follow up with Ms [place] within the next 21-28 business days as agreed by her on today  Pt encouraged to return a call to [facility] RN CM prn Routed note to MD  [facility] CM Care Plan Problem One     Most Recent Value  Care Plan Problem One  Risk for hospital readmission secondary to recently diagnosed sepsis secondary to cellulitis with underlying osteomyelitis, AKI, DM type 1  Role Documenting the Problem One  Care Management Telephonic Coordinator  Care Plan for Problem One  Active  [facility] Long Term Goal   Over the next 60 days, patient will not be hospitalized for complications related to chronic illnesses.DM, HTn, asthma  THN Long Term Goal Start Date  07/02/19  Interventions for Problem One Long Term Goal  assessed for current medical needs, social needs, educational needs, encouraged endocrinology appointment, sent advance directive forms, reviewed upcoming appointments listed in EPIC   THN CM Short Term Goal #1   Over the next 31 days, patient will not experience hospital readmission, as evidence by patient reporting and review of EMR during [facility] RN CCM outreach  [facility] CM Short Term Goal #1 Start Date  07/02/19  Interventions for Short Term Goal #1  assessed for current medical needs, social needs, educational needs, encouraged endocrinology appointment, sent advance directive forms, reviewed upcoming appointments listed in EPIC   [facility] CM Short Term Goal #2   Over the next 7 days, patient will schedule hospital follow up office visit appointments with nephrologist and PCP, as evidenced by patient reporting during [facility] RN  CCM outreach  [facility] CM Short Term Goal #2 Start Date  07/02/19  [facility] CM Short Term Goal #2 Met Date  07/17/19      Joelene Millin L. Lavina Hamman, RN, BSN, [place] Coordinator Office number [phone] Mobile number [phone]  Main THN number [phone] Fax  number [phone]

## 2019-07-17 NOTE — Progress Notes (Signed)
[place] for Infectious Disease  Reason for Consult: Right diabetic foot infection, right buttock cellulitis and early sacral osteomyelitis complicated by group F strep bacteremia Referring Provider: Dr. Mickie Hillier  Assessment: She is doing much better early following 6 weeks of total IV antibiotic therapy.  Her vancomycin induced nephrotoxicity has resolved.  I am quite hopeful that her right foot infection and sacral osteomyelitis have now been cured.  Plan: 1. Discontinue ertapenem and have PICC removed 2. Follow-up here in 6 weeks  Patient Active Problem List   Diagnosis Date Noted  . Chronic multifocal osteomyelitis of right foot ([place]) 06/21/2019    Priority: High  . Streptococcal bacteremia 06/21/2019    Priority: High  . 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    Priority: High  . Sacral osteomyelitis ([place]) 06/07/2019    Priority: High  . AKI (acute kidney injury) ([place])   . Heme positive stool   . Dysphagia   . Normocytic anemia 06/21/2019  . Pressure injury of skin 06/10/2019  . Cellulitis of sacral region   . Vitamin D deficiency 03/20/2019  . Dyspepsia   . Weight loss, unintentional 08/26/2016  . Chronic diarrhea 04/09/2016  . Type 1 diabetes mellitus without complication ([place]) 71/69/6789  . Noncompliance 02/26/2014  . Essential hypertension, benign 11/30/2012  . Acanthosis nigricans, acquired   . Hypoglycemia associated with diabetes ([place])   . Goiter   . Tachycardia   . Diabetic autonomic neuropathy ([place])   . Diabetic peripheral neuropathy ([place])   . Thyroiditis, autoimmune   . Asthma   . Environmental allergies     Patient's Medications  New Prescriptions   No medications on file  Previous Medications   ACETAMINOPHEN (TYLENOL 8 HOUR ARTHRITIS PAIN) 650 MG CR TABLET    Take 650 mg by mouth every 8 (eight) hours as needed for pain.   AMINO ACIDS-PROTEIN HYDROLYS (FEEDING SUPPLEMENT, PRO-STAT SUGAR FREE 64,) LIQD    Take 30 mLs by  mouth 2 (two) times daily.   FEEDING SUPPLEMENT, GLUCERNA SHAKE, (GLUCERNA SHAKE) LIQD    Take 237 mLs by mouth 2 (two) times daily between meals.   GLUCAGON 1 MG INJECTION    Follow package directions for low blood sugar.   GLUCOSE BLOOD (ACCU-CHEK GUIDE) TEST STRIP    Use to test blood sugar 4 times per day.   INSULIN ASPART (NOVOLOG FLEXPEN) 100 UNIT/ML FLEXPEN    Inject 8-11 Units into the skin 3 (three) times daily with meals.   INSULIN GLARGINE (LANTUS) 100 UNIT/ML INJECTION    Inject 0.15 mLs (15 Units total) into the skin at bedtime.   LIPASE/PROTEASE/AMYLASE (CREON) 36000 UNITS CPEP CAPSULE    Take 2 capsules (72,000 Units total) by mouth 3 (three) times daily with meals.   METOPROLOL TARTRATE 37.5 MG TABS    Take 37.5 mg by mouth 2 (two) times daily.   PANTOPRAZOLE (PROTONIX) 40 MG TABLET    Take 1 tablet (40 mg total) by mouth daily before breakfast.   TRAMADOL (ULTRAM) 50 MG TABLET    Take 1 tablet (50 mg total) by mouth every 8 (eight) hours as needed for moderate pain or severe pain.   VITAMIN D, ERGOCALCIFEROL, (DRISDOL) 1.25 MG (50000 UT) CAPS CAPSULE    Take 1 capsule (50,000 Units total) by mouth every 7 (seven) days.  Modified Medications   No medications on file  Discontinued Medications   ERTAPENEM (INVANZ) IVPB    Inject into the vein.  HPI: Lindsey Murillo is a 24 y.o. female with poorly controlled diabetes who is being followed by Dr. Dossie Der at the wound center for right foot infection.  She seemed to be improving when he saw her on 06/05/2019.  He was admitted to [facility] the following day with a temperature of 101.1 degrees and right buttock cellulitis.  MRI did not show any abscess but did show evidence of probable early sacral osteomyelitis.  Admission blood cultures grew group F strep.  She was discharged on 06/12/2019 with a plan for 6 weeks of IV vancomycin and ceftriaxone.  Unfortunately no arrangements were made for monitoring of her IV antibiotic  therapy and by the time she saw Dr. Wolfgang Phoenix she was found to have a supratherapeutic vancomycin level and acute kidney injury leading to readmission on 06/20/2019.  I did add a remote consultation.  It was unclear to me if her right foot infection had already been cured.  I switched her to IV ertapenem and she has now completed 6 weeks of total antibiotic therapy.  Her creatinine came down from 2.57-1.05.  She is feeling much better.  Her buttock pain resolved promptly after her second admission.  She is not having any pain in her right foot.  She denies any problems tolerating her PICC or ertapenem.  Review of Systems: Review of Systems  Constitutional: Negative for chills, diaphoresis, fever and weight loss.       She is pleasant and in no distress.  Respiratory: Negative for cough and shortness of breath.   Cardiovascular: Negative for chest pain.  Gastrointestinal: Positive for diarrhea. Negative for abdominal pain, nausea and vomiting.       No change in her chronic, intermittent diarrhea  Musculoskeletal: Negative for back pain and joint pain.      Past Medical History:  Diagnosis Date  . Acanthosis nigricans, acquired   . Asthma   . Diabetic autonomic neuropathy ([place])   . Diabetic peripheral neuropathy ([place])   . Environmental allergies   . Goiter   . Hypoglycemia associated with diabetes ([place])   . Tachycardia   . Thyroiditis, autoimmune   . Type 1 diabetes mellitus in patient age 67-19 years with HbA1C goal below 7.5     Social History   Tobacco Use  . Smoking status: Never Smoker  . Smokeless tobacco: Never Used  Substance Use Topics  . Alcohol use: No  . Drug use: No    Family History  Problem Relation Age of Onset  . Diabetes Mother        Type II DM  . Thyroid disease Mother   . Diabetes Maternal Grandmother        Type II DM  . Diabetes Cousin        Type II DM  . Colon cancer Neg Hx   . Colon polyps Neg Hx    No Known Allergies  OBJECTIVE: Vitals:    07/17/19 1125  BP: (!) 137/101  Pulse: 92  Temp: 97.7 F (36.5 C)  TempSrc: Oral  Weight: 109 lb (49.4 kg)   Body mass index is 16.1 kg/m.   Physical Exam Constitutional:      General: She is not in acute distress.    Appearance: Normal appearance.  Cardiovascular:     Rate and Rhythm: Normal rate and regular rhythm.     Heart sounds: No murmur.  Pulmonary:     Effort: Pulmonary effort is normal.     Breath sounds: Normal  breath sounds.  Abdominal:     Palpations: Abdomen is soft.     Tenderness: There is no abdominal tenderness.  Musculoskeletal:     Comments: She has some nontender nodularity on the plantar surface at the base of her right second and third toes but no evidence of active infection.  She has no redness, swelling, pain or fluctuance with palpation of her buttocks bilaterally.  She does have what appears to be a healed scar over her coccyx.  Skin:    Findings: No rash.     Comments: Right arm PICC site appears normal.  Psychiatric:        Mood and Affect: Mood normal.       Microbiology: No results found for this or any previous visit (from the past 240 hour(s)).  Michel Bickers, MD [facility] for Infectious [place] [facility] [phone] pager   [phone] cell 07/17/2019, 11:54 AM

## 2019-07-18 ENCOUNTER — Telehealth: Payer: Self-pay | Admitting: Family Medicine

## 2019-07-18 DIAGNOSIS — E875 Hyperkalemia: Secondary | ICD-10-CM

## 2019-07-18 DIAGNOSIS — M4628 Osteomyelitis of vertebra, sacral and sacrococcygeal region: Secondary | ICD-10-CM

## 2019-07-18 NOTE — Telephone Encounter (Signed)
Lindsey Murillo with [facility] calling to report Monday's lab is back for her potassium 5.7

## 2019-07-18 NOTE — Telephone Encounter (Signed)
Please advise. Thank you

## 2019-07-18 NOTE — Telephone Encounter (Signed)
Lindsey Murillo at [facility] stated they have discharged they patient from their care and she has finished her antibiotic and they have removed the [facility] line.  Patient advised to repeat blood work in 2 weeks at [facility] and to avoid potassium rich foods such ad orange juice, beans and bananas-blood work ordered in [employer]- Patient verbalized understanding.

## 2019-07-18 NOTE — Telephone Encounter (Signed)
Dr Richardson Landry received bw results

## 2019-07-18 NOTE — Telephone Encounter (Signed)
Contacted Judson Roch and she will have office fax over all blood work results. Hold message until results received.

## 2019-07-18 NOTE — Telephone Encounter (Signed)
We need ALL b w results faxed to [place] now, really strange we cannot get b w thru system

## 2019-07-18 NOTE — Telephone Encounter (Signed)
Repeat met 7 and cbc and sed rate in two weeks. Avoid potassium rich foods like orange juice and beans and bananas. Send copy of this bw to pts nephrologist and infectious disease doc

## 2019-08-07 ENCOUNTER — Other Ambulatory Visit: Payer: Self-pay | Admitting: *Deleted

## 2019-08-07 NOTE — Patient Outreach (Signed)
[facility] [facility]) Care Management  08/07/2019  [name] [facility] July 28, 1995 [id number]   THN unsuccessfuloutreach to [place] referred patient  Ms Lindsey Murillo was referred to [facility] on 06/26/19 after an admission and discharge from [place], [place] [id number] 24 yo adm on 1/27 due to sepsis secondary to cellulitis with underlying osteomyelitis admission 06/06/19 and readmission 2/10  Last successful outreach with on 07/17/19 and she agreed to follow up in 21-28 business days  Outreach attempt unsuccessful today No answer. THN RN CM left HIPAA [facility] Portability and Accountability Act) compliant voicemail message along with CM's contact info.   Plan: [facility] RN CM scheduled this THN engaged patient for another call attempt within 4-7 business days  [name] L. Lavina Hamman, RN, BSN, [place] Coordinator Office number [phone] Mobile number [phone]  Main THN number [phone] Fax number [phone]

## 2019-08-13 ENCOUNTER — Other Ambulatory Visit: Payer: Self-pay | Admitting: *Deleted

## 2019-08-13 NOTE — Patient Outreach (Signed)
[facility] [facility]) Care Management  08/13/2019  Liesa Tsan [facility] 1995/10/03 [id number]   [facility] unsuccessfuloutreach to[facility] plan referred patient  Ms Mikala Podoll was referred to [facility] by [facility] on 06/26/19 after an admission and discharge from [place], [place] [id number] 24 yo adm on 1/27 due to sepsis secondary to cellulitis with underlying osteomyelitis admission 06/06/19 and readmission 2/10  Last successful outreach with on 07/17/19 and she agreed to follow up in 21-28 business days  Outreach attempt unsuccessful today No answer. THN RN CM left HIPAA [facility] Portability and Accountability Act) compliant voicemail message along with CM's contact info.   Plan: [facility] RN CM sent this [facility] engaged patient an unsuccessful outreach letter and scheduled this [facility] engaged patient for another call attempt within 4-7 business days  Demaris Leavell L. Lavina Hamman, RN, BSN, [place] Coordinator Office number [phone] Mobile number [phone]  Main THN number [phone] Fax number [phone]

## 2019-08-17 ENCOUNTER — Other Ambulatory Visit: Payer: Self-pay | Admitting: *Deleted

## 2019-08-17 NOTE — Patient Outreach (Addendum)
[facility]) Care Management  08/17/2019  Branda Chaudhary [facility] 27-Apr-1996 [id number]   THNthird unsuccessfuloutreach toBright Health plan referred patient  Ms Cyndy Braver was referred to [facility] by [facility] on 06/26/19 after an admission and discharge from [place], [place] [id number] 24 yo adm on 1/27 due to sepsis secondary to cellulitis with underlying osteomyelitis admission 06/06/19 and readmission 2/10  Last successful outreach with on 07/17/19 and she agreed to follow up in21-28 business days  Outreach attemptunsuccessful today No answer. THN RN CM left HIPAA [facility] Portability and Accountability Act) compliant voicemail message along with CM's contact info.  Encouraged a return call from Ms [facility]  Plan: [place] sent this [facility] engaged patient an unsuccessful outreach letter on 08/13/19  [facility] RN CM left 3 voice messages at the only number available for Ms [facility] RN CM scheduled thisTHN engagedpatient for case closure if no response within the next 6business days Routed to MD sent pcp an epic in basket message about pending case closure   Casondra Gasca L. Lavina Hamman, RN, BSN, [place] Coordinator Office number [phone] Mobile number [phone]  Main THN number [phone] Fax number [phone]

## 2019-08-22 ENCOUNTER — Ambulatory Visit (INDEPENDENT_AMBULATORY_CARE_PROVIDER_SITE_OTHER): Payer: 59 | Admitting: Gastroenterology

## 2019-08-22 ENCOUNTER — Encounter: Payer: Self-pay | Admitting: Gastroenterology

## 2019-08-22 ENCOUNTER — Other Ambulatory Visit: Payer: Self-pay

## 2019-08-22 VITALS — BP 127/93 | HR 117 | Temp 97.1°F | Ht 69.0 in | Wt 104.8 lb

## 2019-08-22 DIAGNOSIS — K861 Other chronic pancreatitis: Secondary | ICD-10-CM | POA: Insufficient documentation

## 2019-08-22 DIAGNOSIS — D649 Anemia, unspecified: Secondary | ICD-10-CM

## 2019-08-22 DIAGNOSIS — R634 Abnormal weight loss: Secondary | ICD-10-CM

## 2019-08-22 DIAGNOSIS — K219 Gastro-esophageal reflux disease without esophagitis: Secondary | ICD-10-CM | POA: Insufficient documentation

## 2019-08-22 DIAGNOSIS — K862 Cyst of pancreas: Secondary | ICD-10-CM | POA: Insufficient documentation

## 2019-08-22 DIAGNOSIS — R1310 Dysphagia, unspecified: Secondary | ICD-10-CM

## 2019-08-22 DIAGNOSIS — R1319 Other dysphagia: Secondary | ICD-10-CM

## 2019-08-22 DIAGNOSIS — K863 Pseudocyst of pancreas: Secondary | ICD-10-CM

## 2019-08-22 MED ORDER — PANTOPRAZOLE SODIUM 40 MG PO TBEC: 40.0000 mg | DELAYED_RELEASE_TABLET | Freq: Every day | ORAL | 3 refills | Status: DC

## 2019-08-22 NOTE — Progress Notes (Signed)
Primary Care Physician: Mikey Kirschner, MD  Primary Gastroenterologist:  Barney Drain, MD   Chief Complaint  Patient presents with  . Dysphagia    food get stuck at times in throat even after egd/dil in 06/2019  . Diarrhea    2 times per week, no blood in stool    HPI: Lindsey Murillo is a 24 y.o. female here for hospital follow-up.  Seen back in February.  She has a history of diabetic autonomic/peripheral neuropathy, asthma, hypertension, medical noncompliance, chronic diarrhea, sacral osteomyelitis (admitted earlier this year) on chronic multiple antibiotics seen recently for anemia and heme positive stool.  During recent admission her hemoglobin dropped down to 6.3.  She received 1 unit of packed red blood cells.  EGD showed focal abnormality of the gastric mucosa likely due to trauma's (heaving).  Status post biopsy, mild gastritis negative for H. pylori.  Status post esophageal dilation for history of dysphagia.  As far as chronic diarrhea suspected that she has an element of pancreatic insufficiency or diabetic enteropathy.  Previously her symptoms were improved with pancreatic enzymes.  Every couple of days, feels like something still getting stuck in her esophagus. May cause vomiting.  Some heartburn and epigastric pain.  Not taking PPI anymore.  Weight has been in the 105 110 range for the past month however in February she was 15 to 20 pounds heavier.  At this time she is taking Creon 2 with meals and 1 with snacks.  Seems to be helping a lot with her diarrhea.  She has diarrhea about twice per week.  No blood in the stool or melena.  Patient first noted to have normocytic anemia in December 2020 with hemoglobin of 8.2, hematocrit 26.5.  Hemoglobin dropped to 6.3 during February 2021 admission.  She received a unit of packed red blood cells.  Her last hemoglobin was 9.8 on July 16, 2019.  She was noted to have folate deficiency.  Her iron and iron saturations were low but  TIBC also low.  Ferritin normal in the setting of osteomyelitis.  She was heme positive.  No overt GI bleeding.  Creatinine on July 16, 2019 was 1.08.  Creatinine had been normal at 0.52 on February 2 however when she was admitted to the hospital February 10 it was up to 2.62.  CT abdomen pelvis with contrast June 07, 2019: Mild diffuse periportal edema, otherwise liver normal.  Few scattered calcifications noted within the pancreas which were stable.  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  Soft tissue swelling with inflammatory stranding involving the subcutaneous fat posterior to the sacrum, concerning for infection/cellulitis.,  Associated osseous erosion of the underlying sacrum at approximately S3/S4 segment, highly suggestive of associated osteomyelitis.   Current Outpatient Medications  Medication Sig Dispense Refill  . acetaminophen (TYLENOL 8 HOUR ARTHRITIS PAIN) 650 MG CR tablet Take 650 mg by mouth every 8 (eight) hours as needed for pain.    . feeding supplement, GLUCERNA SHAKE, (GLUCERNA SHAKE) LIQD Take 237 mLs by mouth 2 (two) times daily between meals. 25000 mL 0  . glucagon 1 MG injection Follow package directions for low blood sugar. (Patient taking differently: Inject 1 mg into the skin once as needed. Follow package directions for low blood sugar.) 2 each 3  . glucose blood (ACCU-CHEK GUIDE) test strip Use to test blood sugar 4 times  per day.    . insulin aspart (NOVOLOG FLEXPEN) 100 UNIT/ML FlexPen Inject 8-11 Units into the skin 3 (three) times daily with meals. 5 pen 2  . insulin glargine (LANTUS) 100 UNIT/ML injection Inject 0.15 mLs (15 Units total) into the skin at bedtime. (Patient taking differently: Inject 6 Units into the skin at bedtime. ) 10 mL 11  . lipase/protease/amylase (CREON) 36000 UNITS CPEP capsule Take 2 capsules (72,000 Units total) by mouth 3  (three) times daily with meals. 180 capsule 1  . metoprolol tartrate 37.5 MG TABS Take 37.5 mg by mouth 2 (two) times daily. 60 tablet 2  . traMADol (ULTRAM) 50 MG tablet Take 1 tablet (50 mg total) by mouth every 8 (eight) hours as needed for moderate pain or severe pain. 15 tablet 0  . Vitamin D, Ergocalciferol, (DRISDOL) 1.25 MG (50000 UT) CAPS capsule Take 1 capsule (50,000 Units total) by mouth every 7 (seven) days. 12 capsule 1   No current facility-administered medications for this visit.    Allergies as of 08/22/2019  . (No Known Allergies)   Past Medical History:  Diagnosis Date  . Acanthosis nigricans, acquired   . Asthma   . Diabetic autonomic neuropathy ([place])   . Diabetic peripheral neuropathy ([place])   . Environmental allergies   . Goiter   . Hypoglycemia associated with diabetes ([place])   . Tachycardia   . Thyroiditis, autoimmune   . Type 1 diabetes mellitus in patient age 56-19 years with HbA1C goal below 7.5    Past Surgical History:  Procedure Laterality Date  . BIOPSY  08/27/2016   Procedure: BIOPSY;  Surgeon: Danie Binder, MD;  Location[facility];  Service: Endoscopy;;  duodenum; gastric  . BIOPSY  06/27/2019   Procedure: BIOPSY;  Surgeon: Daneil Dolin, MD;  Location: [facility];  Service: Endoscopy;;  . COLONOSCOPY    . ESOPHAGOGASTRODUODENOSCOPY N/A 08/27/2016   Dr. Oneida Alar: mild gastritis. Negative celiac. No obvious source for dyspepsia/diarrhea  . ESOPHAGOGASTRODUODENOSCOPY (EGD) WITH PROPOFOL N/A 06/27/2019   rourk: Focal abnormality of the gastric mucosa likely due to trauma (heaving).  Biopsy showed mild gastritis, negative for H. pylori.  Esophageal dilation for history of dysphagia but normal-appearing esophagus.   Family History  Problem Relation Age of Onset  . Diabetes Mother        Type II DM  . Thyroid disease Mother   . Diabetes Maternal Grandmother        Type II DM  . Diabetes Cousin        Type II DM  . Colon cancer Neg Hx   .  Colon polyps Neg Hx    Social History   Socioeconomic History  . Marital status: Single    Spouse name: Not on file  . Number of children: Not on file  . Years of education: Not on file  . Highest education level: Not on file  Occupational History  . Not on file  Tobacco Use  . Smoking status: Never Smoker  . Smokeless tobacco: Never Used  Substance and Sexual Activity  . Alcohol use: No  . Drug use: No  . Sexual activity: Never    Birth control/protection: Abstinence  Other Topics Concern  . Not on file  Social History Narrative  . Not on file   Social Determinants of Health   Financial Resource Strain:   . Difficulty of Paying Living Expenses:   Food Insecurity: No Food Insecurity  . Worried About Estate manager/land agent  of Food in the Last Year: Never true  . Ran Out of Food in the Last Year: Never true  Transportation Needs: No Transportation Needs  . Lack of Transportation (Medical): No  . Lack of Transportation (Non-Medical): No  Physical Activity:   . Days of Exercise per Week:   . Minutes of Exercise per Session:   Stress:   . Feeling of Stress :   Social Connections: Slightly Isolated  . Frequency of Communication with Friends and Family: More than three times a week  . Frequency of Social Gatherings with Friends and Family: More than three times a week  . Attends Religious Services: 1 to 4 times per year  . Active Member of Clubs or Organizations: Yes  . Attends Archivist Meetings: 1 to 4 times per year  . Marital Status: Never married  Intimate Partner Violence:   . Fear of Current or Ex-Partner:   . Emotionally Abused:   [place] Physically Abused:   . Sexually Abused:     ROS:  General: Negative for anorexia,   fever, chills, fatigue, weakness. See hpi ENT: Negative for hoarseness, difficulty swallowing , nasal congestion. CV: Negative for chest pain, angina, palpitations, dyspnea on exertion, peripheral edema.  Respiratory: Negative for dyspnea at  rest, dyspnea on exertion, cough, sputum, wheezing.  GI: See history of present illness. GU:  Negative for dysuria, hematuria, urinary incontinence, urinary frequency, nocturnal urination.  Endo: see hpi   Physical Examination:   BP (!) 127/93   Pulse (!) 117   Temp (!) 97.1 F (36.2 C) (Oral)   Ht 5\' 9"  (1.753 m)   Wt 104 lb 12.8 oz (47.5 kg)   LMP 03/11/2019   BMI 15.48 kg/m   General: Thin, female in NAD.  Eyes: No icterus. Mouth: masked Lungs: Clear to auscultation bilaterally.  Heart: Regular rate and rhythm, no murmurs rubs or gallops.  Abdomen: Bowel sounds are normal, nondistended, no hepatosplenomegaly or masses, no abdominal bruits or hernia , no rebound or guarding.  Mild epigastric tenderness to deep palpation. Extremities: No lower extremity edema. No clubbing or deformities. Neuro: Alert and oriented x 4   Skin: Warm and dry, no jaundice.   Psych: Alert and cooperative, normal mood and affect.  Labs:  Lab Results  Component Value Date   CREATININE 2.20 (H) 06/29/2019   BUN 31 (H) 06/29/2019   NA 140 06/29/2019   K 4.9 06/29/2019   CL 110 06/29/2019   CO2 22 06/29/2019   Lab Results  Component Value Date   ALT 9 06/24/2019   AST 10 (L) 06/24/2019   ALKPHOS 196 (H) 06/24/2019   BILITOT 0.4 06/24/2019   Lab Results  Component Value Date   WBC 8.7 06/29/2019   HGB 8.3 (L) 06/29/2019   HCT 27.1 (L) 06/29/2019   MCV 86.9 06/29/2019   PLT 244 06/29/2019   Lab Results  Component Value Date   IRON 51 06/23/2019   TIBC 200 (L) 06/23/2019   FERRITIN 133 06/23/2019   Lab Results  Component Value Date   VITAMINB12 982 (H) 06/23/2019   Lab Results  Component Value Date   FOLATE 4.3 (L) 06/23/2019     Imaging Studies: No results found.  Impression Pleasant 24 year old female with history of diabetic autonomic/peripheral neuropathy, asthma, hypertension, medical noncompliance, chronic diarrhea, osteomyelitis of the fifth metatarsal (December  2020), sacral osteomyelitis (January 2021) is recently completing antibiotic therapy presenting for follow-up of hospitalization.  We saw the patient for profound normocytic  anemia.  She required packed red blood cells.  First noted to have normocytic anemia in December but hemoglobin dropped in the 6 range when she was seen in February.  She was heme positive.  Labs revealed folate deficiency.  Iron and iron saturations in January were low, TIBC low.  Ferritin normal in the setting of osteomyelitis.  She underwent an EGD which did not explain her anemia.  During this time she was also found to have acute kidney injury suspected to be secondary to vancomycin, most recent creatinine much improved but not quite at baseline.  It is suspected that some of her anemia was at least explained by chronic disease in the setting of osteomyelitis.  However colonoscopy advised given no significant findings on EGD.  She also had acute on chronic diarrhea.  Previously did well on pancreatic enzymes in the setting of chronic pancreatitis.  No stool studies performed as patient's diarrhea improved on the hospital.  She continues to do well on Creon.  Patient continues to have some dysphagia status post esophageal dilation during recent admission.  Denies significant heartburn, only occasional.  Esophagus appeared normal on recent EGD.  Query symptoms secondary to atypical reflux.  No longer on PPI.  Pancreatic pseudocyst dating back to 2017, has been stable.  Likely no further work-up needed.  To discuss with Dr. Oneida Alar.  Recommendations 1. Restart pantoprazole 40 mg daily to see if this helps with her sensation of dysphagia. 2. Colonoscopy to further evaluate anemia, chronic diarrhea. 3. Doubt further evaluation of pancreatic pseudocyst needed, has been stable since 2017.  To discuss further with Dr. Oneida Alar.

## 2019-08-22 NOTE — Patient Instructions (Signed)
1. Start pantoprazole 40mg  daily before breakfast. If the burning in your throat, swallowing issues due not settle down within two weeks on the medication, please call. 2. I will let you know if you need additional imaging of your pancreas and plans for follow up once discussed with Dr. Oneida Alar.

## 2019-08-28 ENCOUNTER — Telehealth: Payer: Self-pay | Admitting: Gastroenterology

## 2019-08-28 ENCOUNTER — Encounter: Payer: Self-pay | Admitting: Gastroenterology

## 2019-08-28 ENCOUNTER — Other Ambulatory Visit: Payer: Self-pay | Admitting: *Deleted

## 2019-08-28 NOTE — Telephone Encounter (Signed)
LMOVM for pt 

## 2019-08-28 NOTE — Patient Outreach (Addendum)
[place] [facility]) Care Management  08/28/2019  RALYN STLAURENT 15-Apr-1996 [id number]   Case closure for [facility] plan referred patient  Ms Syreeta Figler was referred to [facility] by [facility] on 06/26/19 after an admission and discharge from [place],  24 yo admitted on 06/06/19 due to sepsis secondary to cellulitis with underlying osteomyelitis admission 06/06/19 and readmission 06/20/19  Last successful outreach with on 07/17/19 and she agreed to follow up in21-28 business days  [place] this [facility] engaged patient an unsuccessful outreach letter on 08/13/19  [facility] RN CM left 3 voice messages at the only number available for Ms [facility] sent pcp an epic in basket message about pending case closure on 08/17/19 with a PCP response [facility] leadership consulted   Plans case closure unable to maintain contact with patient  Case closure letters to patient and pcp Routed note to primary care provider (PCP)  [facility] CM Care Plan Problem One     Most Recent Value  Care Plan Problem One  Risk for hospital readmission secondary to recently diagnosed sepsis secondary to cellulitis with underlying osteomyelitis, AKI, DM type 1  Role Documenting the Problem One  Care Management Telephonic Coordinator  Care Plan for Problem One  Active  [facility] Long Term Goal   Over the next 60 days, patient will not be hospitalized for complications related to chronic illnesses.DM, HTn, asthma  THN Long Term Goal Start Date  07/02/19  THN Long Term Goal Met Date  08/28/19  THN CM Short Term Goal #1   Over the next 31 days, patient will not experience hospital readmission, as evidence by patient reporting and review of EMR during [facility] RN CCM outreach  [facility] CM Short Term Goal #1 Start Date  07/02/19  [facility] CM Short Term Goal #1 Met Date  08/28/19  THN CM Short Term Goal #2   Over the next 7 days, patient will schedule hospital follow up office visit  appointments with nephrologist and PCP, as evidenced by patient reporting during [facility] RN CCM outreach  [facility] CM Short Term Goal #2 Start Date  07/02/19  [facility] CM Short Term Goal #2 Met Date  07/17/19      Joelene Millin L. Lavina Hamman, RN, BSN, [place] Coordinator Office number [phone] Mobile number [phone]  Main THN number [phone] Fax number [phone]

## 2019-08-28 NOTE — Progress Notes (Signed)
Cc'ed to pcp °

## 2019-08-28 NOTE — Telephone Encounter (Signed)
Please let patient know that after her office visit the other day, and further review of her recent hospitalization, she needs further work-up of her anemia.  She needs colonoscopy.  Please schedule colonoscopy with propofol, she is a Dr. Oneida Alar patient. 1. Day of prep: NovoLog half dose before meals, Lantus take half dose at bedtime. 2. A.m. with colonoscopy: Hold NovoLog and Lantus

## 2019-08-28 NOTE — Telephone Encounter (Signed)
noted 

## 2019-08-29 ENCOUNTER — Other Ambulatory Visit: Payer: Self-pay

## 2019-08-29 ENCOUNTER — Ambulatory Visit (INDEPENDENT_AMBULATORY_CARE_PROVIDER_SITE_OTHER): Payer: 59 | Admitting: Internal Medicine

## 2019-08-29 ENCOUNTER — Encounter: Payer: Self-pay | Admitting: Internal Medicine

## 2019-08-29 DIAGNOSIS — T368X5A Adverse effect of other systemic antibiotics, initial encounter: Secondary | ICD-10-CM | POA: Diagnosis not present

## 2019-08-29 DIAGNOSIS — M86371 Chronic multifocal osteomyelitis, right ankle and foot: Secondary | ICD-10-CM | POA: Diagnosis not present

## 2019-08-29 DIAGNOSIS — N1419 Nephropathy induced by other drugs, medicaments and biological substances: Secondary | ICD-10-CM

## 2019-08-29 DIAGNOSIS — N141 Nephropathy induced by other drugs, medicaments and biological substances: Secondary | ICD-10-CM | POA: Diagnosis not present

## 2019-08-29 DIAGNOSIS — M4628 Osteomyelitis of vertebra, sacral and sacrococcygeal region: Secondary | ICD-10-CM

## 2019-08-29 NOTE — Assessment & Plan Note (Signed)
Her renal function was improving after vancomycin was stopped.  I will repeat a BMP today.

## 2019-08-29 NOTE — Assessment & Plan Note (Signed)
I am quite hopeful that her right foot infection has been cured.  I will repeat her inflammatory markers today and continue observation off of antibiotics.

## 2019-08-29 NOTE — Telephone Encounter (Signed)
LMOVM for pt 

## 2019-08-29 NOTE — Telephone Encounter (Signed)
Letter mailed to call back 

## 2019-08-29 NOTE — Progress Notes (Signed)
[facility] for Infectious Disease  Patient Active Problem List   Diagnosis Date Noted  . Chronic multifocal osteomyelitis of right foot ([name]) 06/21/2019    Priority: High  . Streptococcal bacteremia 06/21/2019    Priority: High  . 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    Priority: High  . Sacral osteomyelitis ([place]) 06/07/2019    Priority: High  . Chronic pancreatitis ([place]) 08/22/2019  . Pancreatic pseudocyst/cyst 08/22/2019  . GERD (gastroesophageal reflux disease) 08/22/2019  . AKI (acute kidney injury) ([place])   . Heme positive stool   . Dysphagia   . Normocytic anemia 06/21/2019  . Pressure injury of skin 06/10/2019  . Cellulitis of sacral region   . Vitamin D deficiency 03/20/2019  . Dyspepsia   . Weight loss, unintentional 08/26/2016  . Chronic diarrhea 04/09/2016  . Type 1 diabetes mellitus without complication ([place]) 27/78/2423  . Noncompliance 02/26/2014  . Essential hypertension, benign 11/30/2012  . Acanthosis nigricans, acquired   . Hypoglycemia associated with diabetes ([place])   . Goiter   . Tachycardia   . Diabetic autonomic neuropathy ([place])   . Diabetic peripheral neuropathy ([place])   . Thyroiditis, autoimmune   . Asthma   . Environmental allergies     Patient's Medications  New Prescriptions   No medications on file  Previous Medications   ACETAMINOPHEN (TYLENOL 8 HOUR ARTHRITIS PAIN) 650 MG CR TABLET    Take 650 mg by mouth every 8 (eight) hours as needed for pain.   FEEDING SUPPLEMENT, GLUCERNA SHAKE, (GLUCERNA SHAKE) LIQD    Take 237 mLs by mouth 2 (two) times daily between meals.   GLUCAGON 1 MG INJECTION    Follow package directions for low blood sugar.   GLUCOSE BLOOD (ACCU-CHEK GUIDE) TEST STRIP    Use to test blood sugar 4 times per day.   INSULIN ASPART (NOVOLOG FLEXPEN) 100 UNIT/ML FLEXPEN    Inject 8-11 Units into the skin 3 (three) times daily with meals.   INSULIN GLARGINE (LANTUS) 100 UNIT/ML INJECTION    Inject 0.15 mLs  (15 Units total) into the skin at bedtime.   LIPASE/PROTEASE/AMYLASE (CREON) 36000 UNITS CPEP CAPSULE    Take 2 capsules (72,000 Units total) by mouth 3 (three) times daily with meals.   METOPROLOL TARTRATE 37.5 MG TABS    Take 37.5 mg by mouth 2 (two) times daily.   PANTOPRAZOLE (PROTONIX) 40 MG TABLET    Take 1 tablet (40 mg total) by mouth daily before breakfast.   TRAMADOL (ULTRAM) 50 MG TABLET    Take 1 tablet (50 mg total) by mouth every 8 (eight) hours as needed for moderate pain or severe pain.   VITAMIN D, ERGOCALCIFEROL, (DRISDOL) 1.25 MG (50000 UT) CAPS CAPSULE    Take 1 capsule (50,000 Units total) by mouth every 7 (seven) days.  Modified Medications   No medications on file  Discontinued Medications   No medications on file    Subjective: Lindsey Murillo is in for her routine follow-up visit.  She has poorly controlled diabetes and developed a right foot infection late last year.  She seemed to be improving when he saw her on 06/05/2019.  He was admitted to [facility] the following day with a temperature of 101.1 degrees and right buttock cellulitis.  MRI did not show any abscess but did show evidence of probable early sacral osteomyelitis. Admission blood cultures grew group F strep.  She was discharged on 06/12/2019 with a plan for 6 weeks of  IV vancomycin and ceftriaxone.  Unfortunately no arrangements were made for monitoring of her IV antibiotic therapy and by the time she saw Dr. Wolfgang Phoenix she was found to have a supratherapeutic vancomycin level and acute kidney injury leading to readmission on 06/20/2019.    I did a remote consultation.  It was unclear to me if her right foot infection had already been cured.  I switched her to IV ertapenem and she completed 6 weeks of total antibiotic therapy on 07/17/2019.  Her creatinine came down from 2.57-1.05.  She is feeling much better.  Her buttock pain resolved promptly after her second admission.  She is not having any pain in her right foot.      Review of Systems: Review of Systems  Constitutional: Negative for chills, diaphoresis and fever.  Gastrointestinal: Positive for diarrhea. Negative for abdominal pain, nausea and vomiting.       No change in her chronic, intermittent diarrhea.  Musculoskeletal: Negative for back pain and joint pain.    Past Medical History:  Diagnosis Date  . Acanthosis nigricans, acquired   . Asthma   . Diabetic autonomic neuropathy ([place])   . Diabetic peripheral neuropathy ([place])   . Environmental allergies   . Goiter   . Hypoglycemia associated with diabetes ([place])   . Tachycardia   . Thyroiditis, autoimmune   . Type 1 diabetes mellitus in patient age 22-19 years with HbA1C goal below 7.5     Social History   Tobacco Use  . Smoking status: Never Smoker  . Smokeless tobacco: Never Used  Substance Use Topics  . Alcohol use: No  . Drug use: No    Family History  Problem Relation Age of Onset  . Diabetes Mother        Type II DM  . Thyroid disease Mother   . Diabetes Maternal Grandmother        Type II DM  . Diabetes Cousin        Type II DM  . Colon cancer Neg Hx   . Colon polyps Neg Hx     No Known Allergies  Objective: Vitals:   08/29/19 1615  BP: (!) 144/104  Pulse: (!) 114  Weight: 112 lb (50.8 kg)   Body mass index is 16.54 kg/m.  Physical Exam Constitutional:      Comments: She is in good spirits.  Cardiovascular:     Rate and Rhythm: Normal rate and regular rhythm.     Heart sounds: No murmur.  Pulmonary:     Effort: Pulmonary effort is normal.     Breath sounds: Normal breath sounds.  Musculoskeletal:        General: No swelling or tenderness.     Comments: She has a healing callus on the plantar surface of her right foot at the base of her second and third toes.  There is no evidence of active infection.  Skin:    Findings: No rash.     Comments: She has dry, flaky skin on her right lower leg and foot.  Neurological:     General: No focal deficit  present.       Problem List Items Addressed This Visit      High   Vancomycin-induced nephrotoxicity    Her renal function was improving after vancomycin was stopped.  I will repeat a BMP today.      Sacral osteomyelitis ([place])    I am hopeful that her streptococcal bacteremia and early sacral osteomyelitis has been cured.  Chronic multifocal osteomyelitis of right foot ([place])    I am quite hopeful that her right foot infection has been cured.  I will repeat her inflammatory markers today and continue observation off of antibiotics.      Relevant Orders   CBC   Basic metabolic panel   Sedimentation rate   C-reactive protein       Michel Bickers, MD [facility] for Infectious [place] [phone] pager   [phone] cell 08/29/2019, 4:41 PM

## 2019-08-29 NOTE — Assessment & Plan Note (Signed)
I am hopeful that her streptococcal bacteremia and early sacral osteomyelitis has been cured.

## 2019-08-30 LAB — CBC
HCT: 29 % — ABNORMAL LOW (ref 35.0–45.0)
Hemoglobin: 9.6 g/dL — ABNORMAL LOW (ref 11.7–15.5)
MCH: 27.3 pg (ref 27.0–33.0)
MCHC: 33.1 g/dL (ref 32.0–36.0)
MCV: 82.4 fL (ref 80.0–100.0)
MPV: 9.8 fL (ref 7.5–12.5)
Platelets: 254 10*3/uL (ref 140–400)
RBC: 3.52 10*6/uL — ABNORMAL LOW (ref 3.80–5.10)
RDW: 16.1 % — ABNORMAL HIGH (ref 11.0–15.0)
WBC: 7.1 10*3/uL (ref 3.8–10.8)

## 2019-08-30 LAB — SEDIMENTATION RATE: Sed Rate: 67 mm/h — ABNORMAL HIGH (ref 0–20)

## 2019-08-30 LAB — BASIC METABOLIC PANEL
BUN/Creatinine Ratio: 48 (calc) — ABNORMAL HIGH (ref 6–22)
BUN: 47 mg/dL — ABNORMAL HIGH (ref 7–25)
CO2: 22 mmol/L (ref 20–32)
Calcium: 9.8 mg/dL (ref 8.6–10.2)
Chloride: 104 mmol/L (ref 98–110)
Creat: 0.98 mg/dL (ref 0.50–1.10)
Glucose, Bld: 351 mg/dL — ABNORMAL HIGH (ref 65–99)
Potassium: 5.5 mmol/L — ABNORMAL HIGH (ref 3.5–5.3)
Sodium: 134 mmol/L — ABNORMAL LOW (ref 135–146)

## 2019-08-30 LAB — C-REACTIVE PROTEIN: CRP: 0.4 mg/L (ref <8.0)

## 2019-10-15 ENCOUNTER — Other Ambulatory Visit: Payer: Self-pay | Admitting: Family Medicine

## 2019-10-27 ENCOUNTER — Other Ambulatory Visit: Payer: Self-pay

## 2019-10-27 ENCOUNTER — Emergency Department (HOSPITAL_COMMUNITY): Payer: 59

## 2019-10-27 ENCOUNTER — Encounter (HOSPITAL_COMMUNITY): Payer: Self-pay | Admitting: Emergency Medicine

## 2019-10-27 ENCOUNTER — Emergency Department (HOSPITAL_COMMUNITY)
Admission: EM | Admit: 2019-10-27 | Discharge: 2019-10-27 | Disposition: A | Discharge status: Home / Self Care | Payer: 59 | Attending: Emergency Medicine | Admitting: Emergency Medicine

## 2019-10-27 DIAGNOSIS — M62838 Other muscle spasm: Secondary | ICD-10-CM | POA: Insufficient documentation

## 2019-10-27 DIAGNOSIS — I10 Essential (primary) hypertension: Secondary | ICD-10-CM | POA: Diagnosis not present

## 2019-10-27 DIAGNOSIS — Z794 Long term (current) use of insulin: Secondary | ICD-10-CM | POA: Diagnosis not present

## 2019-10-27 DIAGNOSIS — E10649 Type 1 diabetes mellitus with hypoglycemia without coma: Secondary | ICD-10-CM | POA: Insufficient documentation

## 2019-10-27 DIAGNOSIS — E1040 Type 1 diabetes mellitus with diabetic neuropathy, unspecified: Secondary | ICD-10-CM | POA: Insufficient documentation

## 2019-10-27 DIAGNOSIS — R072 Precordial pain: Secondary | ICD-10-CM

## 2019-10-27 DIAGNOSIS — J45909 Unspecified asthma, uncomplicated: Secondary | ICD-10-CM | POA: Insufficient documentation

## 2019-10-27 DIAGNOSIS — Z79899 Other long term (current) drug therapy: Secondary | ICD-10-CM | POA: Diagnosis not present

## 2019-10-27 DIAGNOSIS — R0602 Shortness of breath: Secondary | ICD-10-CM | POA: Diagnosis present

## 2019-10-27 LAB — CBC WITH DIFFERENTIAL/PLATELET
Abs Immature Granulocytes: 0.03 10*3/uL (ref 0.00–0.07)
Basophils Absolute: 0.1 10*3/uL (ref 0.0–0.1)
Basophils Relative: 1 %
Eosinophils Absolute: 0.4 10*3/uL (ref 0.0–0.5)
Eosinophils Relative: 4 %
HCT: 33.9 % — ABNORMAL LOW (ref 36.0–46.0)
Hemoglobin: 11.2 g/dL — ABNORMAL LOW (ref 12.0–15.0)
Immature Granulocytes: 0 %
Lymphocytes Relative: 32 %
Lymphs Abs: 3.1 10*3/uL (ref 0.7–4.0)
MCH: 28.1 pg (ref 26.0–34.0)
MCHC: 33 g/dL (ref 30.0–36.0)
MCV: 85.2 fL (ref 80.0–100.0)
Monocytes Absolute: 0.4 10*3/uL (ref 0.1–1.0)
Monocytes Relative: 4 %
Neutro Abs: 5.6 10*3/uL (ref 1.7–7.7)
Neutrophils Relative %: 59 %
Platelets: 300 10*3/uL (ref 150–400)
RBC: 3.98 MIL/uL (ref 3.87–5.11)
RDW: 11.9 % (ref 11.5–15.5)
WBC: 9.5 10*3/uL (ref 4.0–10.5)
nRBC: 0 % (ref 0.0–0.2)

## 2019-10-27 LAB — BASIC METABOLIC PANEL
Anion gap: 11 (ref 5–15)
BUN: 34 mg/dL — ABNORMAL HIGH (ref 6–20)
CO2: 27 mmol/L (ref 22–32)
Calcium: 8.9 mg/dL (ref 8.9–10.3)
Chloride: 92 mmol/L — ABNORMAL LOW (ref 98–111)
Creatinine, Ser: 0.92 mg/dL (ref 0.44–1.00)
GFR calc Af Amer: >60 mL/min (ref >60)
GFR calc non Af Amer: >60 mL/min (ref >60)
Glucose, Bld: 227 mg/dL — ABNORMAL HIGH (ref 70–99)
Potassium: 4 mmol/L (ref 3.5–5.1)
Sodium: 130 mmol/L — ABNORMAL LOW (ref 135–145)

## 2019-10-27 LAB — I-STAT BETA HCG BLOOD, ED (MC, WL, AP ONLY): I-stat hCG, quantitative: <5 m[IU]/mL (ref <5)

## 2019-10-27 LAB — D-DIMER, QUANTITATIVE: D-Dimer, Quant: 0.69 ug/mL-FEU — ABNORMAL HIGH (ref 0.00–0.50)

## 2019-10-27 LAB — TROPONIN I (HIGH SENSITIVITY)
Troponin I (High Sensitivity): 5 ng/L (ref <18)
Troponin I (High Sensitivity): 6 ng/L (ref <18)

## 2019-10-27 LAB — CBG MONITORING, ED: Glucose-Capillary: 136 mg/dL — ABNORMAL HIGH (ref 70–99)

## 2019-10-27 MED ORDER — METHOCARBAMOL 500 MG PO TABS
500.0000 mg | ORAL_TABLET | Freq: Two times a day (BID) | ORAL | 0 refills | Status: DC
Start: 2019-10-27 — End: 2020-05-06

## 2019-10-27 MED ORDER — IOHEXOL 350 MG/ML SOLN
100.0000 mL | Freq: Once | INTRAVENOUS | Status: AC | PRN
  Administered 2019-10-27: 75 mL via INTRAVENOUS

## 2019-10-27 MED ORDER — SODIUM CHLORIDE 0.9 % IV BOLUS
1000.0000 mL | Freq: Once | INTRAVENOUS | Status: AC
  Administered 2019-10-27: 1000 mL via INTRAVENOUS

## 2019-10-27 NOTE — ED Provider Notes (Signed)
[facility] Provider Note   CSN: [id number] Arrival date & time: 10/27/19  1753     History Chief Complaint  Patient presents with  . Shoulder Pain    Lindsey Murillo is a 24 y.o. female with past medical history significant for autoimmune thyroiditis, type 1 diabetes, osteomyelitis, tachycardia, chronic pancreatitis who presents for evaluation of shoulder and chest pain.  Patient states she feels like she has pleuritic chest pain from bilateral shoulders to anterior chest.  Intermittent shortness of breath.  Feels like this is worse with some movement.  Has been taking ibuprofen without relief.  Denies fever, chills, nausea, vomiting, abdominal pain, unilateral leg swelling, redness, warmth.  Denies additional aggravating relieving factors.  No prior history of PE or DVT.  Rates pain 5/10.  Denies additional aggravating or relieving factors.  History obtained from patient and past medical records.  No interpreter used.  HPI     Past Medical History:  Diagnosis Date  . Acanthosis nigricans, acquired   . Asthma   . Diabetic autonomic neuropathy ([place])   . Diabetic peripheral neuropathy ([place])   . Environmental allergies   . Goiter   . Hypoglycemia associated with diabetes ([place])   . Tachycardia   . Thyroiditis, autoimmune   . Type 1 diabetes mellitus in patient age 22-19 years with HbA1C goal below 7.5     Patient Active Problem List   Diagnosis Date Noted  . Chronic pancreatitis ([place]) 08/22/2019  . Pancreatic pseudocyst/cyst 08/22/2019  . GERD (gastroesophageal reflux disease) 08/22/2019  . AKI (acute kidney injury) ([place])   . Heme positive stool   . Dysphagia   . Normocytic anemia 06/21/2019  . Chronic multifocal osteomyelitis of right foot ([place]) 06/21/2019  . Streptococcal bacteremia 06/21/2019  . 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 Pressure injury of skin 06/10/2019  . Sacral osteomyelitis ([place]) 06/07/2019  . Cellulitis of sacral  region   . Vitamin D deficiency 03/20/2019  . Dyspepsia   . Weight loss, unintentional 08/26/2016  . Chronic diarrhea 04/09/2016  . Type 1 diabetes mellitus without complication ([place]) 38/25/0539  . Noncompliance 02/26/2014  . Essential hypertension, benign 11/30/2012  . Acanthosis nigricans, acquired   . Hypoglycemia associated with diabetes ([place])   . Goiter   . Tachycardia   . Diabetic autonomic neuropathy ([place])   . Diabetic peripheral neuropathy ([place])   . Thyroiditis, autoimmune   . Asthma   . Environmental allergies     Past Surgical History:  Procedure Laterality Date  . BIOPSY  08/27/2016   Procedure: BIOPSY;  Surgeon: Danie Binder, MD;  Location: [facility];  Service: Endoscopy;;  duodenum; gastric  . BIOPSY  06/27/2019   Procedure: BIOPSY;  Surgeon: Daneil Dolin, MD;  Location: [facility];  Service: Endoscopy;;  . COLONOSCOPY    . ESOPHAGOGASTRODUODENOSCOPY N/A 08/27/2016   Dr. Oneida Alar: mild gastritis. Negative celiac. No obvious source for dyspepsia/diarrhea  . ESOPHAGOGASTRODUODENOSCOPY (EGD) WITH PROPOFOL N/A 06/27/2019   rourk: Focal abnormality of the gastric mucosa likely due to trauma (heaving).  Biopsy showed mild gastritis, negative for H. pylori.  Esophageal dilation for history of dysphagia but normal-appearing esophagus.     OB History    Gravida  1   Para      Term      Preterm      AB      Living  0     SAB      TAB  Ectopic      Multiple      Live Births              Family History  Problem Relation Age of Onset  . Diabetes Mother        Type II DM  . Thyroid disease Mother   . Diabetes Maternal Grandmother        Type II DM  . Diabetes Cousin        Type II DM  . Colon cancer Neg Hx   . Colon polyps Neg Hx     Social History   Tobacco Use  . Smoking status: Never Smoker  . Smokeless tobacco: Never Used  Vaping Use  . Vaping Use: Never used  Substance Use Topics  . Alcohol use: No  . Drug use: No     Home Medications Prior to Admission medications   Medication Sig Start Date End Date Taking? Authorizing Provider  amoxicillin (AMOXIL) 500 MG capsule Take 500 mg by mouth every 8 (eight) hours. 7 day course starting on 10/25/2019 10/25/19  Yes [provider]  feeding supplement, GLUCERNA SHAKE, (GLUCERNA SHAKE) LIQD Take 237 mLs by mouth 2 (two) times daily between meals. 06/29/19  Yes Barton Dubois, MD  glucagon 1 MG injection Follow package directions for low blood sugar. Patient taking differently: Inject 1 mg into the skin once as needed. Follow package directions for low blood sugar. 01/05/12  Yes Sherrlyn Hock, MD  insulin aspart (NOVOLOG FLEXPEN) 100 UNIT/ML FlexPen Inject 8-11 Units into the skin 3 (three) times daily with meals. 03/20/19  Yes Nida, Marella Chimes, MD  insulin glargine (LANTUS) 100 UNIT/ML injection Inject 0.15 mLs (15 Units total) into the skin at bedtime. Patient taking differently: Inject 6 Units into the skin at bedtime.  06/12/19  Yes Manuella Ghazi, Pratik D, DO  lipase/protease/amylase (CREON) 36000 UNITS CPEP capsule Take 2 capsules (72,000 Units total) by mouth 3 (three) times daily with meals. 06/29/19  Yes Barton Dubois, MD  metoprolol tartrate (LOPRESSOR) 50 MG tablet Take 50 mg by mouth 2 (two) times daily. 09/21/19  Yes [provider]  pantoprazole (PROTONIX) 40 MG tablet Take 1 tablet (40 mg total) by mouth daily before breakfast. 08/22/19  Yes Mahala Menghini, PA-C  traMADol (ULTRAM) 50 MG tablet Take 1 tablet (50 mg total) by mouth every 8 (eight) hours as needed for moderate pain or severe pain. 06/12/19  Yes Shah, Pratik D, DO  Vitamin D, Ergocalciferol, (DRISDOL) 1.25 MG (50000 UT) CAPS capsule Take 1 capsule (50,000 Units total) by mouth every 7 (seven) days. 03/20/19  Yes Nida, Marella Chimes, MD  methocarbamol (ROBAXIN) 500 MG tablet Take 1 tablet (500 mg total) by mouth 2 (two) times daily. 10/27/19   Dashonna Chagnon A, PA-C     Allergies    Patient has no known allergies.  Review of Systems   Review of Systems  Constitutional: Negative.   HENT: Negative.   Respiratory: Negative.   Cardiovascular: Positive for chest pain. Negative for palpitations and leg swelling.  Gastrointestinal: Negative.   Genitourinary: Negative.   Musculoskeletal: Positive for back pain. Negative for arthralgias, gait problem, joint swelling, myalgias, neck pain and neck stiffness.  Skin: Negative.   Neurological: Negative.   All other systems reviewed and are negative.   Physical Exam Updated Vital Signs BP (!) 160/116   Pulse (!) 113   Temp 98.7 F (37.1 C) (Oral)   Resp 13   Ht 5\' 9"  (1.753  m)   Wt 48.4 kg   SpO2 100%   BMI 15.76 kg/m   Physical Exam Vitals and nursing note reviewed.  Constitutional:      General: She is not in acute distress.    Appearance: She is well-developed. She is not ill-appearing, toxic-appearing or diaphoretic.  HENT:     Head: Normocephalic and atraumatic.     Nose: Nose normal.     Mouth/Throat:     Mouth: Mucous membranes are moist.  Eyes:     Pupils: Pupils are equal, round, and reactive to light.  Cardiovascular:     Rate and Rhythm: Tachycardia present.     Pulses: Normal pulses.     Heart sounds: Normal heart sounds.  Pulmonary:     Effort: Pulmonary effort is normal. No respiratory distress.     Breath sounds: Normal breath sounds. No stridor. No wheezing, rhonchi or rales.  Chest:     Chest wall: No tenderness.  Abdominal:     General: Bowel sounds are normal. There is no distension.     Palpations: There is no mass.     Tenderness: There is no abdominal tenderness. There is no right CVA tenderness, left CVA tenderness, guarding or rebound.     Hernia: No hernia is present.  Musculoskeletal:        General: No swelling, tenderness, deformity or signs of injury. Normal range of motion.     Cervical back: Normal range of motion.     Right lower leg: No edema.      Left lower leg: No edema.     Comments: Homans sign negative.  No bony tenderness.  Moves all 4 extremities without difficulty.  Compartments soft.  Palpable spasm to bilateral trapezius  Skin:    General: Skin is warm and dry.     Capillary Refill: Capillary refill takes less than 2 seconds.  Neurological:     General: No focal deficit present.     Mental Status: She is alert and oriented to person, place, and time.    ED Results / Procedures / Treatments   Labs (all labs ordered are listed, but only abnormal results are displayed) Labs Reviewed  CBC WITH DIFFERENTIAL/PLATELET - Abnormal; Notable for the following components:      Result Value   Hemoglobin 11.2 (*)    HCT 33.9 (*)    All other components within normal limits  BASIC METABOLIC PANEL - Abnormal; Notable for the following components:   Sodium 130 (*)    Chloride 92 (*)    Glucose, Bld 227 (*)    BUN 34 (*)    All other components within normal limits  D-DIMER, QUANTITATIVE (NOT AT [facility]) - Abnormal; Notable for the following components:   D-Dimer, Quant 0.69 (*)    All other components within normal limits  CBG MONITORING, ED - Abnormal; Notable for the following components:   Glucose-Capillary 136 (*)    All other components within normal limits  I-STAT BETA HCG BLOOD, ED ([facility], [facility], [facility] ONLY)  TROPONIN I (HIGH SENSITIVITY)  TROPONIN I (HIGH SENSITIVITY)    EKG None  Radiology DG Chest 2 View  Result Date: 10/27/2019 CLINICAL DATA:  Left-sided chest pain.  Bilateral shoulder pain. EXAM: CHEST - 2 VIEW COMPARISON:  Most recent radiograph 06/20/2019. FINDINGS: The cardiomediastinal contours are normal. Minor subsegmental opacities at both lung bases. Pulmonary vasculature is normal. No consolidation, pleural effusion, or pneumothorax. No acute osseous abnormalities are seen. IMPRESSION: Minor subsegmental bibasilar atelectasis.  Electronically Signed   By: Keith Rake M.D.   On: 10/27/2019 20:47   CT Angio  Chest PE W/Cm &/Or Wo Cm  Result Date: 10/27/2019 CLINICAL DATA:  Bilateral shoulder pain and chest pain EXAM: CT ANGIOGRAPHY CHEST WITH CONTRAST TECHNIQUE: Multidetector CT imaging of the chest was performed using the standard protocol during bolus administration of intravenous contrast. Multiplanar CT image reconstructions and MIPs were obtained to evaluate the vascular anatomy. CONTRAST:  81mL OMNIPAQUE IOHEXOL 350 MG/ML SOLN COMPARISON:  None. FINDINGS: Cardiovascular: There is a optimal opacification of the pulmonary arteries. There is no central,segmental, or subsegmental filling defects within the pulmonary arteries. The heart is normal in size. No pericardial effusion or thickening. No evidence right heart strain. There is normal three-vessel brachiocephalic anatomy without proximal stenosis. The thoracic aorta is normal in appearance. Mediastinum/Nodes: No hilar, mediastinal, or axillary adenopathy. Thyroid gland, trachea, and esophagus demonstrate no significant findings. Lungs/Pleura: Minimal tree-in-bud opacities are seen within the right middle lobe. No pleural effusion or pneumothorax. No airspace consolidation. Upper Abdomen: No acute abnormalities present in the visualized portions of the upper abdomen. Musculoskeletal: No chest wall abnormality. No acute or significant osseous findings. Review of the MIP images confirms the above findings. IMPRESSION: No central, segmental, or subsegmental pulmonary embolism. Minimal tree-in-bud opacities within the right middle lobe which could be due to infectious or inflammatory process. Electronically Signed   By: Prudencio Pair M.D.   On: 10/27/2019 23:13    Procedures Procedures (including critical care time)  Medications Ordered in ED Medications  sodium chloride 0.9 % bolus 1,000 mL (1,000 mLs Intravenous New Bag/Given 10/27/19 2024)  iohexol (OMNIPAQUE) 350 MG/ML injection 100 mL (75 mLs Intravenous Contrast Given 10/27/19 2252)    ED Course  I  have reviewed the triage vital signs and the nursing notes.  Pertinent labs & imaging results that were available during my care of the patient were reviewed by me and considered in my medical decision making (see chart for details).  24 year old female presents for evaluation of chest pain and bilateral scapula pain.  Afebrile, nonseptic, not ill-appearing.  Does have tachycardia to the 115's.  Does have history of tachycardia.  No unilateral leg swelling, redness or warmth.  No prior history of PE or DVTs.  Patient does not appear septic.  Somewhat able to reproduce the pain.  No recent extend [place] exercises or movement.  Unable to [facility] due to tachycardia.  Will obtain labs, imaging and reassess.  Labs and imaging personally reviewed and interpreted Troponin 5>>6 Pregnancy test negative D-dimer 0.69, will add on CTA chest Metabolic panel with mild hyponatremia to 130, hyperglycemia at 227, given IV fluids CBC without leukocytosis, hemoglobin at baseline at 11.2 KG sinus tachycardia, no STEMI Chest x-ray with bibasilar atelectasis CTA chest without evidence of PE however possible inflammatory versus infectious changes  Patient reassessed.  She denies any chest pain however does have bilateral trapezius spasms.  Does have some hyperglycemia however no elevated anion gap.  Low suspicion for DKA.  Her tachycardia has improved to around 105 which is where she has been previously.  Does have history of tachycardia.  Dsiscussed CT findings of infectious versus inflammatory colitis.  She has no cough, shortness of breath.  She is recently started antibiotics for dental infection.  Low suspicion for bacterial infectious process.  Will DC home with symptomatic management for trapezius spasms.   Patient is to be discharged with recommendation to follow up with PCP in regards to today's  hospital visit. Chest pain is not likely of cardiac or pulmonary etiology d/t presentation, VSS, no tracheal deviation, no  JVD or new murmur, RRR, breath sounds equal bilaterally, EKG without acute abnormalities, negative troponin, and negative CXR. Pt has been advised to return to the ED if CP becomes exertional, associated with diaphoresis or nausea, radiates to left jaw/arm, worsens or becomes concerning in any way. Pt appears reliable for follow up and is agreeable to discharge.   The patient has been appropriately medically screened and/or stabilized in the ED. I have low suspicion for any other emergent medical condition which would require further screening, evaluation or treatment in the ED or require inpatient management.  Patient is hemodynamically stable and in no acute distress.  Patient able to ambulate in department prior to ED.  Evaluation does not show acute pathology that would require ongoing or additional emergent interventions while in the emergency department or further inpatient treatment.  I have discussed the diagnosis with the patient and answered all questions.  Pain is been managed while in the emergency department and patient has no further complaints prior to discharge.  Patient is comfortable with plan discussed in room and is stable for discharge at this time.  I have discussed strict return precautions for returning to the emergency department.  Patient was encouraged to follow-up with PCP/specialist refer to at discharge.    MDM Rules/Calculators/A&P                           Final Clinical Impression(s) / ED Diagnoses Final diagnoses:  Precordial pain  Trapezius muscle spasm    Rx / DC Orders ED Discharge Orders         Ordered    methocarbamol (ROBAXIN) 500 MG tablet  2 times daily     Discontinue  Reprint     10/27/19 2325           Kadiatou Oplinger A, PA-C 10/27/19 2329    Milton Ferguson, MD 10/29/19 1047

## 2019-10-27 NOTE — Discharge Instructions (Addendum)
Take the muscle relaxers as needed.  Continue taking the antibiotics for dental infection  Turn for new or worsening symptoms

## 2019-10-27 NOTE — ED Triage Notes (Signed)
Bilateral shoulder pain for the last 2 dasy   Feels like it gets tight then releases   Mother told pt it may be a muscle spasm

## 2019-12-17 ENCOUNTER — Ambulatory Visit: Payer: 59 | Admitting: Gastroenterology

## 2019-12-17 NOTE — Progress Notes (Deleted)
Primary Care Physician: Mikey Kirschner, MD (Inactive)   Primary Gastroenterologist:  Formerly Dr. Oneida Alar  No chief complaint on file.   HPI: Lindsey Murillo is a 24 y.o. female here for follow-up.  Last seen in April 2021.  She has a history of diabetic autonomic/peripheral neuropathy, asthma, hypertension, medical noncompliance, chronic diarrhea, sacral osteomyelitis, anemia and heme positive stool.  EGD February 2021 showed focal abnormality of the gastric mucosa likely due to trauma from heaving.  Biopsy showed mild gastritis but negative for H. pylori.  Esophageal dilation performed for history of dysphagia.  Diarrhea suspected to be due to pancreatic insufficiency and/or diabetic enteropathy, previously improved with pancreatic enzymes.  Anemia at least in part due to chronic disease in the setting of osteomyelitis.  Colonoscopy advised but patient never returned her phone calls.  CT abdomen pelvis with contrast June 07, 2019: Mild diffuse periportal edema, otherwise liver normal.  Few scattered calcifications noted within the pancreas which were stable.  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  Soft tissue swelling with inflammatory stranding involving the subcutaneous fat posterior to the sacrum, concerning for infection/cellulitis.,  Associated osseous erosion of the underlying sacrum at approximately S3/S4 segment, highly suggestive of associated osteomyelitis.  CTA chest 06/29/2019 showed minimal tree-in-bud opacities within the right middle lobe which could reflect infection or inflammatory process.  Labs from October 27, 2019 during ED visit for chest pain: BUN 31, creatinine 0.92, sodium 130, hemoglobin 11.2, hematocrit 33.9, platelets 300,000, white blood cell count 9500.  Current Outpatient Medications  Medication Sig Dispense Refill  . amoxicillin (AMOXIL)  500 MG capsule Take 500 mg by mouth every 8 (eight) hours. 7 day course starting on 10/25/2019    . feeding supplement, GLUCERNA SHAKE, (GLUCERNA SHAKE) LIQD Take 237 mLs by mouth 2 (two) times daily between meals. 25000 mL 0  . glucagon 1 MG injection Follow package directions for low blood sugar. (Patient taking differently: Inject 1 mg into the skin once as needed. Follow package directions for low blood sugar.) 2 each 3  . insulin aspart (NOVOLOG FLEXPEN) 100 UNIT/ML FlexPen Inject 8-11 Units into the skin 3 (three) times daily with meals. 5 pen 2  . insulin glargine (LANTUS) 100 UNIT/ML injection Inject 0.15 mLs (15 Units total) into the skin at bedtime. (Patient taking differently: Inject 6 Units into the skin at bedtime. ) 10 mL 11  . lipase/protease/amylase (CREON) 36000 UNITS CPEP capsule Take 2 capsules (72,000 Units total) by mouth 3 (three) times daily with meals. 180 capsule 1  . methocarbamol (ROBAXIN) 500 MG tablet Take 1 tablet (500 mg total) by mouth 2 (two) times daily. 20 tablet 0  . metoprolol tartrate (LOPRESSOR) 50 MG tablet Take 50 mg by mouth 2 (two) times daily.    . pantoprazole (PROTONIX) 40 MG tablet Take 1 tablet (40 mg total) by mouth daily before breakfast. 30 tablet 3  . traMADol (ULTRAM) 50 MG tablet Take 1 tablet (50 mg total) by mouth every 8 (eight) hours as needed for moderate pain or severe pain. 15 tablet 0  . Vitamin D, Ergocalciferol, (DRISDOL) 1.25 MG (50000 UT) CAPS capsule Take 1 capsule (50,000 Units total) by mouth every 7 (seven) days. 12 capsule 1   No current facility-administered medications for this visit.    Allergies as of 12/17/2019  . (No Known Allergies)  ROS:  General: Negative for anorexia, weight loss, fever, chills, fatigue, weakness. ENT: Negative for hoarseness, difficulty swallowing , nasal congestion. CV: Negative for chest pain, angina, palpitations, dyspnea on exertion, peripheral edema.  Respiratory: Negative for dyspnea at  rest, dyspnea on exertion, cough, sputum, wheezing.  GI: See history of present illness. GU:  Negative for dysuria, hematuria, urinary incontinence, urinary frequency, nocturnal urination.  Endo: Negative for unusual weight change.    Physical Examination:   There were no vitals taken for this visit.  General: Well-nourished, well-developed in no acute distress.  Eyes: No icterus. Mouth: Oropharyngeal mucosa moist and pink , no lesions erythema or exudate. Lungs: Clear to auscultation bilaterally.  Heart: Regular rate and rhythm, no murmurs rubs or gallops.  Abdomen: Bowel sounds are normal, nontender, nondistended, no hepatosplenomegaly or masses, no abdominal bruits or hernia , no rebound or guarding.   Extremities: No lower extremity edema. No clubbing or deformities. Neuro: Alert and oriented x 4   Skin: Warm and dry, no jaundice.   Psych: Alert and cooperative, normal mood and affect.  Labs:  ***  Imaging Studies: No results found.

## 2019-12-24 ENCOUNTER — Ambulatory Visit: Payer: 59 | Admitting: Podiatry

## 2019-12-26 ENCOUNTER — Encounter: Payer: Self-pay | Admitting: Internal Medicine

## 2019-12-26 ENCOUNTER — Ambulatory Visit (INDEPENDENT_AMBULATORY_CARE_PROVIDER_SITE_OTHER): Payer: 59 | Admitting: Internal Medicine

## 2019-12-26 ENCOUNTER — Other Ambulatory Visit: Payer: Self-pay

## 2019-12-26 VITALS — BP 152/107 | HR 110 | Temp 96.9°F | Ht 69.0 in | Wt 131.6 lb

## 2019-12-26 DIAGNOSIS — K529 Noninfective gastroenteritis and colitis, unspecified: Secondary | ICD-10-CM | POA: Diagnosis not present

## 2019-12-26 DIAGNOSIS — K219 Gastro-esophageal reflux disease without esophagitis: Secondary | ICD-10-CM

## 2019-12-26 DIAGNOSIS — R1310 Dysphagia, unspecified: Secondary | ICD-10-CM

## 2019-12-26 DIAGNOSIS — K863 Pseudocyst of pancreas: Secondary | ICD-10-CM

## 2019-12-26 DIAGNOSIS — K861 Other chronic pancreatitis: Secondary | ICD-10-CM

## 2019-12-26 DIAGNOSIS — K862 Cyst of pancreas: Secondary | ICD-10-CM | POA: Diagnosis not present

## 2019-12-26 DIAGNOSIS — R1319 Other dysphagia: Secondary | ICD-10-CM

## 2019-12-26 NOTE — Patient Instructions (Signed)
Continue on Creon for diarrhea. We may consider colonoscopy in the future if this worsens or begin having abdominal pain or bleeding.   Continue on daily Pantoprazole for reflux and trouble swallowing. We may consider repeat EGD in the future with balloon dilation.   Follow up with me in 3 months or sooner if needed.   At [facility] we value your feedback. You may receive a survey about your visit today. Please share your experience as we strive to create trusting relationships with our patients to provide genuine, compassionate, quality care.  We appreciate your understanding and patience as we review any laboratory studies, imaging, and other diagnostic tests that are ordered as we care for you. Our office policy is 5 business days for review of these results, and any emergent or urgent results are addressed in a timely manner for your best interest. If you do not hear from our office in 1 week, please contact us.   We also encourage the use of MyChart, which contains your medical information for your review as well. If you are not enrolled in this feature, an access code is on this after visit summary for your convenience. Thank you for allowing [place] to be involved in your care.  It was great to see you today!  I hope you have a great rest of your summer!!

## 2019-12-26 NOTE — Progress Notes (Signed)
Primary Care Physician:  Kathyrn Drown, MD Primary Gastroenterologist:  Dr. Abbey Chatters  Chief Complaint  Patient presents with  . Dysphagia    with meds  . Emesis    mostly in mornings    HPI:   Lindsey Murillo is a 24 y.o. female who presents  to the clinic today for follow-up visit.  She has a complicated past medical history. She has a history of diabetic autonomic/peripheral neuropathy, asthma, hypertension, medical noncompliance, chronic diarrhea, sacral osteomyelitis (admitted earlier this year) on chronic multiple antibiotics seen recently for anemia and heme positive stool.   Patient first noted to have normocytic anemia in December 2020 with hemoglobin of 8.2, hematocrit 26.5.  Hemoglobin dropped to 6.3 during February 2021 admission.  She received a unit of packed red blood cells.  Her last hemoglobin was 9.8 on July 16, 2019.  She was noted to have folate deficiency.  Her iron and iron saturations were low but TIBC also low.  Ferritin normal in the setting of osteomyelitis.  She was heme positive.  No overt GI bleeding.  CT abdomen pelvis with contrast June 07, 2019: Mild diffuse periportal edema, otherwise liver normal.  Few scattered calcifications noted within the pancreas which were stable.  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   Today patient states she is doing pretty well.  She does continue to have intermittent diarrhea a few times a week but this is vastly improved since starting Creon.  Denies any melena or hematochezia.  She is postop colonoscopy scheduled previously but this never happened.  Does continue to have dysphagia as well primarily with pills.  She underwent a Maloney dilation which she states helped for a few days but then her symptoms returned.  Denies regurgitation of partially foods.  Otherwise she has no other complaints.  Past Medical  History:  Diagnosis Date  . Acanthosis nigricans, acquired   . Asthma   . Diabetic autonomic neuropathy ([place])   . Diabetic peripheral neuropathy ([place])   . Environmental allergies   . Goiter   . Hypoglycemia associated with diabetes ([place])   . Tachycardia   . Thyroiditis, autoimmune   . Type 1 diabetes mellitus in patient age 38-19 years with HbA1C goal below 7.5     Past Surgical History:  Procedure Laterality Date  . BIOPSY  08/27/2016   Procedure: BIOPSY;  Surgeon: Danie Binder, MD;  Location: [facility];  Service: Endoscopy;;  duodenum; gastric  . BIOPSY  06/27/2019   Procedure: BIOPSY;  Surgeon: Daneil Dolin, MD;  Location: [facility];  Service: Endoscopy;;  . COLONOSCOPY    . ESOPHAGOGASTRODUODENOSCOPY N/A 08/27/2016   Dr. Oneida Alar: mild gastritis. Negative celiac. No obvious source for dyspepsia/diarrhea  . ESOPHAGOGASTRODUODENOSCOPY (EGD) WITH PROPOFOL N/A 06/27/2019   rourk: Focal abnormality of the gastric mucosa likely due to trauma (heaving).  Biopsy showed mild gastritis, negative for H. pylori.  Esophageal dilation for history of dysphagia but normal-appearing esophagus.    Current Outpatient Medications  Medication Sig Dispense Refill  . glucagon 1 MG injection Follow package directions for low blood sugar. (Patient taking differently: Inject 1 mg into the skin once as needed. Follow package directions for low blood sugar.) 2 each 3  . insulin aspart (NOVOLOG FLEXPEN) 100 UNIT/ML FlexPen Inject 8-11 Units into the skin 3 (three) times daily with meals. 5 pen 2  .  insulin glargine (LANTUS) 100 UNIT/ML injection Inject 0.15 mLs (15 Units total) into the skin at bedtime. (Patient taking differently: Inject 6 Units into the skin at bedtime. ) 10 mL 11  . lipase/protease/amylase (CREON) 36000 UNITS CPEP capsule Take 2 capsules (72,000 Units total) by mouth 3 (three) times daily with meals. 180 capsule 1  . methocarbamol (ROBAXIN) 500 MG tablet Take 1 tablet (500 mg  total) by mouth 2 (two) times daily. 20 tablet 0  . metoprolol tartrate (LOPRESSOR) 50 MG tablet Take 50 mg by mouth 2 (two) times daily.    . pantoprazole (PROTONIX) 40 MG tablet Take 1 tablet (40 mg total) by mouth daily before breakfast. 30 tablet 3  . traMADol (ULTRAM) 50 MG tablet Take 1 tablet (50 mg total) by mouth every 8 (eight) hours as needed for moderate pain or severe pain. 15 tablet 0  . Vitamin D, Ergocalciferol, (DRISDOL) 1.25 MG (50000 UT) CAPS capsule Take 1 capsule (50,000 Units total) by mouth every 7 (seven) days. 12 capsule 1  . amoxicillin (AMOXIL) 500 MG capsule Take 500 mg by mouth every 8 (eight) hours. 7 day course starting on 10/25/2019 (Patient not taking: Reported on 12/26/2019)    . feeding supplement, GLUCERNA SHAKE, (GLUCERNA SHAKE) LIQD Take 237 mLs by mouth 2 (two) times daily between meals. (Patient not taking: Reported on 12/26/2019) 25000 mL 0   No current facility-administered medications for this visit.    Allergies as of 12/26/2019  . (No Known Allergies)    Family History  Problem Relation Age of Onset  . Diabetes Mother        Type II DM  . Thyroid disease Mother   . Diabetes Maternal Grandmother        Type II DM  . Diabetes Cousin        Type II DM  . Colon cancer Neg Hx   . Colon polyps Neg Hx     Social History   Socioeconomic History  . Marital status: Single    Spouse name: Not on file  . Number of children: Not on file  . Years of education: Not on file  . Highest education level: Not on file  Occupational History  . Not on file  Tobacco Use  . Smoking status: Never Smoker  . Smokeless tobacco: Never Used  Vaping Use  . Vaping Use: Never used  Substance and Sexual Activity  . Alcohol use: No  . Drug use: No  . Sexual activity: Never    Birth control/protection: Abstinence  Other Topics Concern  . Not on file  Social History Narrative  . Not on file   Social Determinants of Health   Financial Resource Strain:   .  Difficulty of Paying Living Expenses:   Food Insecurity: No Food Insecurity  . Worried About Charity fundraiser in the Last Year: Never true  . Ran Out of Food in the Last Year: Never true  Transportation Needs: No Transportation Needs  . Lack of Transportation (Medical): No  . Lack of Transportation (Non-Medical): No  Physical Activity:   . Days of Exercise per Week:   . Minutes of Exercise per Session:   Stress:   . Feeling of Stress :   Social Connections: Moderately Integrated  . Frequency of Communication with Friends and Family: More than three times a week  . Frequency of Social Gatherings with Friends and Family: More than three times a week  . Attends Religious Services: 1 to 4  times per year  . Active Member of Clubs or Organizations: Yes  . Attends Archivist Meetings: 1 to 4 times per year  . Marital Status: Never married  Intimate Partner Violence:   . Fear of Current or Ex-Partner:   . Emotionally Abused:   Marland Kitchen Physically Abused:   . Sexually Abused:     Subjective: Review of Systems  Constitutional: Negative for chills and fever.  HENT: Negative for congestion and hearing loss.   Eyes: Negative for blurred vision and double vision.  Respiratory: Negative for cough and shortness of breath.   Cardiovascular: Negative for chest pain and palpitations.  Gastrointestinal: Positive for diarrhea. Negative for abdominal pain, blood in stool, constipation, heartburn, melena and vomiting.       Dysphagia  Genitourinary: Negative for dysuria and urgency.  Musculoskeletal: Negative for joint pain and myalgias.  Skin: Negative for itching and rash.  Neurological: Negative for dizziness and headaches.  Psychiatric/Behavioral: Negative for depression. The patient is not nervous/anxious.        Objective: BP (!) 152/107   Pulse (!) 110   Temp (!) 96.9 F (36.1 C) (Temporal)   Ht 5\' 9"  (1.753 m)   Wt 131 lb 9.6 oz (59.7 kg)   BMI 19.43 kg/m  Physical  Exam Constitutional:      Appearance: Normal appearance.  HENT:     Head: Normocephalic and atraumatic.  Eyes:     Extraocular Movements: Extraocular movements intact.     Conjunctiva/sclera: Conjunctivae normal.  Cardiovascular:     Rate and Rhythm: Normal rate and regular rhythm.  Pulmonary:     Effort: Pulmonary effort is normal.     Breath sounds: Normal breath sounds.  Abdominal:     General: Bowel sounds are normal.     Palpations: Abdomen is soft.  Musculoskeletal:        General: No swelling. Normal range of motion.     Cervical back: Normal range of motion and neck supple.  Skin:    General: Skin is warm and dry.     Coloration: Skin is not jaundiced.  Neurological:     General: No focal deficit present.     Mental Status: She is alert and oriented to person, place, and time.  Psychiatric:        Mood and Affect: Mood normal.        Behavior: Behavior normal.      Assessment: *Anemia-chronic, improved with most recent hemoglobin 11.2 on 10/27/2019 *Pancreatic insufficiency *Pancreatic pseudocyst *Diarrhea-chronic, improved on Creon *Reflux-chronic, improved on pantoprazole *Dysphagia-mainly to solids and pills  Plan: In regards to patient's pancreatic insufficiency and subsequent diarrhea, this is vastly improved on Creon.  I will continue her on this. Her anemia is improved with most recent hemoglobin 11.2.  Discussed performing colonoscopy and patient would like to hold off for now.  Will discuss again on follow-up visit. Dysphagia was only mildly improved with Maloney dilation.  Discussed repeating EGD with potential balloon dilation and she would like to think about it. Continue on daily pantoprazole for chronic reflux which appears to be well controlled No repeat imaging at this time for pancreatic pseudocyst which is unchanged since 2017.  We will consider reimaging if she has worsening symptoms of pain.  Patient follow-up in 3 months or sooner if  needed  12/26/2019 1:58 PM   Disclaimer: This note was dictated with voice recognition software. Similar sounding words can inadvertently be transcribed and may not be corrected upon review.

## 2020-03-10 ENCOUNTER — Encounter: Payer: Self-pay | Admitting: Internal Medicine

## 2020-04-30 ENCOUNTER — Other Ambulatory Visit: Payer: Self-pay

## 2020-04-30 ENCOUNTER — Encounter: Payer: Self-pay | Admitting: Gastroenterology

## 2020-04-30 ENCOUNTER — Ambulatory Visit (INDEPENDENT_AMBULATORY_CARE_PROVIDER_SITE_OTHER): Payer: 59 | Admitting: Gastroenterology

## 2020-04-30 VITALS — BP 139/101 | HR 106 | Temp 97.1°F | Ht 69.0 in | Wt 142.2 lb

## 2020-04-30 DIAGNOSIS — D649 Anemia, unspecified: Secondary | ICD-10-CM | POA: Diagnosis not present

## 2020-04-30 DIAGNOSIS — K861 Other chronic pancreatitis: Secondary | ICD-10-CM

## 2020-04-30 DIAGNOSIS — K219 Gastro-esophageal reflux disease without esophagitis: Secondary | ICD-10-CM | POA: Diagnosis not present

## 2020-04-30 NOTE — Patient Instructions (Signed)
1. Please call with updated medication list. 2. We will also request list from your pharmacy. Will increase or change your reflux medication once reviewed. 3. We will request copy of latest labs for review. Further recommendations to follow regarding colonoscopy. 4. Please avoid overeating, eating fried/greasy foods which are hard to digest and will make your reflux and vomiting worse.

## 2020-04-30 NOTE — Progress Notes (Signed)
Primary Care Physician: Erven Colla, DO  Primary Gastroenterologist:  Elon Alas. Abbey Chatters, DO   Chief Complaint  Patient presents with  . Abdominal Pain    Nausea    HPI: Lindsey Murillo is a 24 y.o. female here for follow-up.  Last seen in August.  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  Normocytic anemia first noted in December 2020 with hemoglobin of 8.2.  Hemoglobin dropped to 6.3 during February 2021 admission, received packed red blood cells.  Noted to have folate deficiency.  Iron and iron saturations low but TIBC also low.  Ferritin normal in the setting of osteomyelitis.  She was heme positive.  During this time she also had acute kidney injury suspected to be due to vancomycin.  Last hemoglobin 11.2 on October 27, 2019.  CT abdomen pelvis with contrast June 07, 2019: Mild diffuse periportal edema, otherwise liver normal.  Few scattered calcifications noted within the pancreas which were stable.  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   Switched to new kidney doctor on Monday in town. [place]. Can't recall name. Just had labs completed.  Today she complains of vomiting. She can vomit food from the day before. No hematemesis. Mostly has nausea on regular basis but vomiting less frequent. No dysphagia. Has a lot of heartburn. BM doing well on Creon. No diarrhea. No melena, brbpr. Complains of daily abdominal pain, worse in evenings.  Burning quality. Nothing makes it better.   She presents today without medication list. She receives all of her medications from [facility]. We have requested copy for review.        Current Outpatient Medications  Medication Sig Dispense Refill  . feeding supplement, GLUCERNA SHAKE, (GLUCERNA SHAKE) LIQD Take 237 mLs by mouth 2 (two) times daily between meals. 25000 mL 0  . glucagon 1 MG injection Follow package directions for low blood sugar. (Patient taking differently: Inject 1 mg into the skin once as needed. Follow package directions for low blood sugar.) 2 each 3  . insulin aspart (NOVOLOG FLEXPEN) 100 UNIT/ML FlexPen Inject 8-11 Units into the skin 3 (three) times daily with meals. 5 pen 2  . insulin glargine (LANTUS) 100 UNIT/ML injection Inject 0.15 mLs (15 Units total) into the skin at bedtime. (Patient taking differently: Inject 6 Units into the skin at bedtime.) 10 mL 11  . lipase/protease/amylase (CREON) 36000 UNITS CPEP capsule Take 2 capsules (72,000 Units total) by mouth 3 (three) times daily with meals. 180 capsule 1  . lisinopril (ZESTRIL) 20 MG tablet Take 20 mg by mouth daily.    . methocarbamol (ROBAXIN) 500 MG tablet Take 1 tablet (500 mg total) by mouth 2 (two) times daily. 20 tablet 0  . metoprolol tartrate (LOPRESSOR) 50 MG tablet Take 50 mg by mouth 2 (two) times daily.    . pantoprazole (PROTONIX) 40 MG tablet Take 1 tablet (40 mg total) by mouth daily before breakfast. 30 tablet 3  . traMADol (ULTRAM) 50 MG tablet Take 1 tablet (50 mg total) by mouth every 8 (eight) hours as needed for moderate pain or  severe pain. 15 tablet 0  . Vitamin D, Ergocalciferol, (DRISDOL) 1.25 MG (50000 UT) CAPS capsule Take 1 capsule (50,000 Units total) by mouth every 7 (seven) days. 12 capsule 1  . furosemide (LASIX) 40 MG tablet Take 40 mg by mouth daily.     No current facility-administered medications for this visit.    Allergies as of 04/30/2020  . (No Known Allergies)   Past  Medical History:  Diagnosis Date  . Acanthosis nigricans, acquired   . Asthma   . Diabetic autonomic neuropathy ([place])   . Diabetic peripheral neuropathy ([place])   . Environmental allergies   . Goiter   . Hypoglycemia associated with diabetes ([place])   . Tachycardia   . Thyroiditis, autoimmune   . Type 1 diabetes mellitus in patient age 105-19 years with HbA1C goal below 7.5    Past Surgical History:  Procedure Laterality Date  . BIOPSY  08/27/2016   Procedure: BIOPSY;  Surgeon: Danie Binder, MD;  [facility];  Service: Endoscopy;;  duodenum; gastric  . BIOPSY  06/27/2019   Procedure: BIOPSY;  Surgeon: Daneil Dolin, MD;  [facility];  Service: Endoscopy;;  . COLONOSCOPY    . ESOPHAGOGASTRODUODENOSCOPY N/A 08/27/2016   Dr. Oneida Alar: mild gastritis. Negative celiac. No obvious source for dyspepsia/diarrhea  . ESOPHAGOGASTRODUODENOSCOPY (EGD) WITH PROPOFOL N/A 06/27/2019   rourk: Focal abnormality of the gastric mucosa likely due to trauma (heaving).  Biopsy showed mild gastritis, negative for H. pylori.  Esophageal dilation for history of dysphagia but normal-appearing esophagus.   Family History  Problem Relation Age of Onset  . Diabetes Mother        Type II DM  . Thyroid disease Mother   . Diabetes Maternal Grandmother        Type II DM  . Diabetes Cousin        Type II DM  . Colon cancer Neg Hx   . Colon polyps Neg Hx    Social History   Tobacco Use  . Smoking status: Never Smoker  . Smokeless tobacco: Never Used  Vaping Use  . Vaping Use: Never used  Substance Use Topics  . Alcohol use: No  . Drug use: No    ROS:  General: Negative for anorexia, weight loss, fever, chills, fatigue, weakness. Weight up 35 pounds since 08/2019.  ENT: Negative for hoarseness, difficulty swallowing , nasal congestion. CV: Negative for chest pain, angina, palpitations, dyspnea on exertion, peripheral edema.  Respiratory: Negative for dyspnea at rest, dyspnea on  exertion, cough, sputum, wheezing.  GI: See history of present illness. GU:  Negative for dysuria, hematuria, urinary incontinence, urinary frequency, nocturnal urination.  Endo: Negative for unusual weight change.    Physical Examination:   BP (!) 139/101   Pulse (!) 106   Temp (!) 97.1 F (36.2 C) (Temporal)   Ht 5\' 9"  (1.753 m)   Wt 142 lb 3.2 oz (64.5 kg)   LMP 01/09/2019   BMI 21.00 kg/m   General: Well-nourished, well-developed in no acute distress.  Eyes: No icterus. Mouth: masked Lungs: Clear to auscultation bilaterally.  Heart: Regular rate and rhythm, no murmurs rubs or gallops.  Abdomen: Bowel sounds are normal, nontender, nondistended, no hepatosplenomegaly or masses, no abdominal bruits or hernia , no rebound or guarding.   Extremities: Lower extremities with findings of chronic edema to the knees with tough skin, darkening of the skin.  No clubbing or deformities. Neuro: Alert and oriented x 4  Skin: Warm and dry, no jaundice.   Psych: Alert and cooperative, normal mood and affect.  Labs:  Lab Results  Component Value Date   CREATININE 0.92 10/27/2019   BUN 34 (H) 10/27/2019   NA 130 (L) 10/27/2019   K 4.0 10/27/2019   CL 92 (L) 10/27/2019   CO2 27 10/27/2019   Lab Results  Component Value Date   ALT 9 06/24/2019   AST 10 (L) 06/24/2019   ALKPHOS 196 (H) 06/24/2019   BILITOT 0.4 06/24/2019   Lab Results  Component Value Date   WBC 9.5 10/27/2019   HGB 11.2 (L) 10/27/2019   HCT 33.9 (L) 10/27/2019   MCV 85.2 10/27/2019   PLT 300 10/27/2019   Lab Results  Component Value Date   IRON 51 06/23/2019   TIBC 200 (L) 06/23/2019   FERRITIN 133 06/23/2019   Lab Results  Component Value Date   FOLATE 4.3 (L) 06/23/2019   Lab Results  Component Value Date   VITAMINB12 982 (H) 06/23/2019   Lab Results  Component Value Date   HGBA1C 9.3 (H) 05/09/2019      Imaging Studies: No results found.  Impression:  Pleasant 23 year old female with  past medical history significant for diabetic autonomic/peripheral neuropathy, asthma, hypertension, chronic pancreatitis with pancreatic insufficiency, pancreatic pseudocyst, sacral osteomyelitis, anemia, heme positive stools presenting for follow-up.  Anemia/heme positive stools: Normocytic anemia first noted December 2020 with hemoglobin of 8.2.  Hemoglobin dropped to 6.3 during February 2021 admission, required packed red blood cells.  Noted to have folate deficiency, iron/iron saturation/TIBC all low.  She was heme positive.  Declined colonoscopy at that time.  Completed EGD February 2021 noted to have mild gastritis, biopsies negative for celiac disease.  Last labs available from June 2021, hemoglobin 11.2.  Chronic pancreatitis/pancreatic insufficiency: Diarrhea well controlled on Creon.  No weight loss.  Also with chronic pancreatic pseudocyst dating back to 2007, appears stable.  Nausea with intermittent vomiting.  Cannot exclude underlying gastroparesis, refractory GERD contributing to symptoms.  EGD in February as outlined above.  Plan:  1. Obtain updated medication list.  Once obtained, we can make recommendations regarding changing PPI for increasing dose for better control of reflux. 2. Request copy of latest labs from her new nephrologist for review. 3. Move towards colonoscopy in the near future once we have addressed recent labs and find out what medication she is on. 4. For nausea, began with managing as gastroparesis.  Avoid overeating, eat multiple small meals daily.  Avoid fried/greasy foods.  Further recommendations to follow.

## 2020-05-06 ENCOUNTER — Telehealth: Payer: Self-pay | Admitting: Gastroenterology

## 2020-05-06 NOTE — Telephone Encounter (Signed)
Phoned pt and LM on vm to return call in regards to her medications.

## 2020-05-06 NOTE — Telephone Encounter (Signed)
Reviewed current med list provided by [facility]. List of medications filled from 10/2019 to 04/30/20.   She is taking Lantus, lasix, lisinopril. She has not filled creon or pantoprazole.   Please let pt know that we reviewed her medications from [facility].  They do not show any recent prescriptions filled for Creon (pancreatic enzymes) or her reflux medication (pantoprazole).  1. If she is not taking we need to restart. If she needs refills let me know. 2. Can we request copy of latest labs from her nephrologist as soon as possible. Need prior to deciding about colonoscopy.

## 2020-05-07 NOTE — Telephone Encounter (Signed)
Pt couldn't provide me with a doctor's name or address of her nephrologist when she signed her release. I faxed a request for records to Dr Toya Smothers office.

## 2020-05-07 NOTE — Telephone Encounter (Signed)
Phoned and LM on pt's vm to return call reagrding her medications.

## 2020-05-08 NOTE — Telephone Encounter (Signed)
Sent the pt a letter.

## 2020-05-08 NOTE — Progress Notes (Signed)
Dear Lindsey Murillo,  We have tried to contact you on several occasions regarding questions regarding a couple of your medications. Please call our office at your earliest convenience to discuss this.    Thank you, Floria Raveling, CMA

## 2020-05-12 ENCOUNTER — Encounter: Payer: Self-pay | Admitting: Gastroenterology

## 2020-05-12 NOTE — Telephone Encounter (Signed)
Received copy of labs dated 04/28/2020 by Dr. Moshe Cipro her nephrologist.  BUN 32, creatinine 1.32, glucose 371, sodium 137, potassium 4.  1. We need to get her to have labs: CBC, iron/tibc/ferritin, folate.  2. We can schedule her for colonoscopy for h/o anemia, heme pos stool, rectal bleeding. With Dr. Abbey Chatters. ASA III.  -Day of prep:lantus 1/2 dose at bedtime.  -Would advise trilyte prep due to renal insufficiency. 3.  Advise to restart Creon and pantoprazole once we get in touch with her. May need new prescriptions. Let me know.

## 2020-05-16 NOTE — Telephone Encounter (Signed)
Phoned and LM on pt vm to return call

## 2021-01-20 ENCOUNTER — Encounter: Payer: Self-pay | Admitting: Internal Medicine

## 2021-01-22 ENCOUNTER — Telehealth: Payer: Self-pay | Admitting: Family Medicine

## 2021-01-22 NOTE — Telephone Encounter (Signed)
Provider received lab results on patient. (Lab work states reviewed by Donato Heinz on 01/13/21). Provider states that pt never been seen by me. Pls call pt to follow up with new provider in October.  Left message to return call. (Labs at nurse station)

## 2021-01-22 NOTE — Telephone Encounter (Signed)
Pt returned call and verbalized understanding. Pt has appts in October and will call our office in October to schedule appt.

## 2021-01-23 ENCOUNTER — Other Ambulatory Visit: Payer: Self-pay | Admitting: Nephrology

## 2021-01-23 ENCOUNTER — Other Ambulatory Visit (HOSPITAL_COMMUNITY): Payer: Self-pay

## 2021-01-23 ENCOUNTER — Other Ambulatory Visit (HOSPITAL_COMMUNITY): Payer: Self-pay | Admitting: Nephrology

## 2021-01-23 DIAGNOSIS — E1029 Type 1 diabetes mellitus with other diabetic kidney complication: Secondary | ICD-10-CM

## 2021-01-23 DIAGNOSIS — R809 Proteinuria, unspecified: Secondary | ICD-10-CM

## 2021-01-23 DIAGNOSIS — N1832 Chronic kidney disease, stage 3b: Secondary | ICD-10-CM

## 2021-02-10 ENCOUNTER — Other Ambulatory Visit (HOSPITAL_COMMUNITY): Payer: Self-pay | Admitting: Physician Assistant

## 2021-02-11 ENCOUNTER — Other Ambulatory Visit (HOSPITAL_COMMUNITY): Payer: Self-pay | Admitting: Physician Assistant

## 2021-02-11 ENCOUNTER — Other Ambulatory Visit: Payer: Self-pay | Admitting: Internal Medicine

## 2021-02-12 ENCOUNTER — Ambulatory Visit (HOSPITAL_COMMUNITY)
Admission: RE | Admit: 2021-02-12 | Discharge: 2021-02-12 | Disposition: A | Discharge status: Home / Self Care | Payer: 59 | Source: Ambulatory Visit | Attending: Nephrology | Admitting: Nephrology

## 2021-02-12 ENCOUNTER — Encounter (HOSPITAL_COMMUNITY): Payer: Self-pay

## 2021-02-12 DIAGNOSIS — N1832 Chronic kidney disease, stage 3b: Secondary | ICD-10-CM | POA: Diagnosis present

## 2021-02-12 DIAGNOSIS — E1029 Type 1 diabetes mellitus with other diabetic kidney complication: Secondary | ICD-10-CM | POA: Diagnosis present

## 2021-02-12 DIAGNOSIS — R809 Proteinuria, unspecified: Secondary | ICD-10-CM | POA: Diagnosis present

## 2021-02-12 LAB — CBC
HCT: 27.3 % — ABNORMAL LOW (ref 36.0–46.0)
Hemoglobin: 9 g/dL — ABNORMAL LOW (ref 12.0–15.0)
MCH: 28 pg (ref 26.0–34.0)
MCHC: 33 g/dL (ref 30.0–36.0)
MCV: 85 fL (ref 80.0–100.0)
Platelets: 226 10*3/uL (ref 150–400)
RBC: 3.21 MIL/uL — ABNORMAL LOW (ref 3.87–5.11)
RDW: 13.9 % (ref 11.5–15.5)
WBC: 10.2 10*3/uL (ref 4.0–10.5)
nRBC: 0 % (ref 0.0–0.2)

## 2021-02-12 LAB — PROTIME-INR
INR: 0.9 (ref 0.8–1.2)
Prothrombin Time: 12 seconds (ref 11.4–15.2)

## 2021-02-12 LAB — GLUCOSE, CAPILLARY: Glucose-Capillary: 216 mg/dL — ABNORMAL HIGH (ref 70–99)

## 2021-02-12 MED ORDER — SODIUM CHLORIDE 0.9 % IV SOLN: INTRAVENOUS | Status: DC

## 2021-02-12 NOTE — Progress Notes (Addendum)
Patient ID: Lindsey Murillo, female   DOB: June 09, 1995, 25 y.o.   MRN: [id number] Pt's random renal biopsy scheduled for today has been canceled by Dr. Elisha Headland) secondary to persistently high BP readings. Dr. Elissa Hefty office notified.

## 2021-02-12 NOTE — Progress Notes (Signed)
Procedure cancelled by Dr. Dwaine Gale due to high blood pressure. D/C home in wheelchair with mother. Awake and alert. In no distress

## 2021-04-23 ENCOUNTER — Other Ambulatory Visit (HOSPITAL_COMMUNITY): Payer: Self-pay | Admitting: Nephrology

## 2021-04-23 DIAGNOSIS — E1029 Type 1 diabetes mellitus with other diabetic kidney complication: Secondary | ICD-10-CM

## 2021-04-23 DIAGNOSIS — N1832 Chronic kidney disease, stage 3b: Secondary | ICD-10-CM

## 2021-05-13 ENCOUNTER — Telehealth: Payer: Self-pay | Admitting: Gastroenterology

## 2021-05-13 ENCOUNTER — Encounter (HOSPITAL_COMMUNITY): Payer: Self-pay | Admitting: Hematology

## 2021-05-13 NOTE — Telephone Encounter (Signed)
Please call patient, having abdominal pain so bad at times she is crying.

## 2021-05-13 NOTE — Telephone Encounter (Signed)
Lmom for pt to return my call.  

## 2021-05-13 NOTE — Progress Notes (Signed)
[facility] [address], [place] [address]   CLINIC:  Medical Oncology/Hematology  Patient Care Team: Coral Spikes, DO as PCP - General (Family Medicine) Danie Binder, MD (Inactive) as Consulting Physician (Gastroenterology) Ricard Dillon, MD as Consulting Physician (Internal Medicine) Eloise Harman, DO as Consulting Physician (Internal Medicine)  CHIEF COMPLAINTS/PURPOSE OF CONSULTATION:  Evaluation of normocytic anemia  HISTORY OF PRESENTING ILLNESS:  Lindsey Murillo 26 y.o. female is here because of normocytic anemia, at the request of Dr. Marval Regal.  Today she reports feeling fair, and she is accompanied by her mother. She reports 1 blood transfusion 2 years ago. She denies any current bleeding. She reports her legs have been swollen following a hospitalization in 2021. She has difficulty with balance and has been walking with the assistance of a cane since April 2022. She is not able to climb stairs. She reports SOB with exertion. She reports vomiting after eating and pain with swallowing. She denies weight loss. Her appetite is fair. She denies ice pica. She denies history of CVA and MI. She has a history of type 1 DM since she was 26 years old. She has never taken iron tablets. She reports regular BM.   She currently lives at home with her mother. She is not currently working, but previously she worked at [employer]. She denies smoking history and chemical exposure. Her father passed from pancreatic cancer. Neither her nor her mother are aware of any family history of anemia.   MEDICAL HISTORY:  Past Medical History:  Diagnosis Date   Acanthosis nigricans, acquired    Asthma    Diabetic autonomic neuropathy ([name])    Diabetic peripheral neuropathy ([name])    Environmental allergies    Goiter    Hypoglycemia associated with diabetes ([name])    Tachycardia    Thyroiditis, autoimmune    Type 1 diabetes mellitus in patient age 72-19 years with  HbA1C goal below 7.5     SURGICAL HISTORY: Past Surgical History:  Procedure Laterality Date   BIOPSY  08/27/2016   Procedure: BIOPSY;  Surgeon: Danie Binder, MD;  [facility];  Service: Endoscopy;;  duodenum; gastric   BIOPSY  06/27/2019   Procedure: BIOPSY;  Surgeon: Daneil Dolin, MD;  [facility];  Service: Endoscopy;;   COLONOSCOPY     ESOPHAGOGASTRODUODENOSCOPY N/A 08/27/2016   Dr. Oneida Alar: mild gastritis. Negative celiac. No obvious source for dyspepsia/diarrhea   ESOPHAGOGASTRODUODENOSCOPY (EGD) WITH PROPOFOL N/A 06/27/2019   rourk: Focal abnormality of the gastric mucosa likely due to trauma (heaving).  Biopsy showed mild gastritis, negative for H. pylori.  Esophageal dilation for history of dysphagia but normal-appearing esophagus.    SOCIAL HISTORY: Social History   Socioeconomic History   Marital status: Single    Spouse name: Not on file   Number of children: Not on file   Years of education: Not on file   Highest education level: Not on file  Occupational History   Not on file  Tobacco Use   Smoking status: Never   Smokeless tobacco: Never  Vaping Use   Vaping Use: Never used  Substance and Sexual Activity   Alcohol use: No   Drug use: No   Sexual activity: Never    Birth control/protection: Abstinence  Other Topics Concern   Not on file  Social History Narrative   Not on file   Social Determinants of Health   Financial Resource Strain: Not on file  Food Insecurity: Not on file  Transportation Needs: Not on file  Physical Activity: Not on file  Stress: Not on file  Social Connections: Not on file  Intimate Partner Violence: Not on file    FAMILY HISTORY: Family History  Problem Relation Age of Onset   Diabetes Mother        Type II DM   Thyroid disease Mother    Diabetes Maternal Grandmother        Type II DM   Diabetes Cousin        Type II DM   Colon cancer Neg Hx    Colon polyps Neg Hx     ALLERGIES:  has No  Known Allergies.  MEDICATIONS:  Current Outpatient Medications  Medication Sig Dispense Refill   Cholecalciferol (VITAMIN D3) 125 MCG (5000 UT) TABS Take 5,000 Units by mouth once a week.     FARXIGA 10 MG TABS tablet Take 10 mg by mouth in the morning.     furosemide (LASIX) 40 MG tablet Take 40 mg by mouth 2 (two) times daily.     glucagon 1 MG injection Follow package directions for low blood sugar. (Patient taking differently: Inject 1 mg into the skin once as needed. Follow package directions for low blood sugar.) 2 each 3   insulin aspart (NOVOLOG FLEXPEN) 100 UNIT/ML FlexPen Inject 8-11 Units into the skin 3 (three) times daily with meals. (Patient taking differently: Inject 0-10 Units into the skin 3 (three) times daily before meals. 0-90=no insulin 91-150=2 units 151-200=4 units 201-250=6 units 251-349=8 units  350 and up=10 units) 5 pen 2   insulin glargine (LANTUS) 100 UNIT/ML injection Inject 0.15 mLs (15 Units total) into the skin at bedtime. (Patient taking differently: Inject 0-5 Units into the skin at bedtime. 0-99=no insulin 100 and up=5 units) 10 mL 11   lisinopril (ZESTRIL) 20 MG tablet Take 20 mg by mouth daily.     acetaminophen (TYLENOL) 325 MG tablet Take 650 mg by mouth every 6 (six) hours as needed (for pain). (Patient not taking: Reported on 05/14/2021)     No current facility-administered medications for this visit.    REVIEW OF SYSTEMS:   Review of Systems  Constitutional:  Positive for appetite change and fatigue. Negative for unexpected weight change.  HENT:   Positive for trouble swallowing (pain). Negative for nosebleeds.   Respiratory:  Negative for hemoptysis.   Cardiovascular:  Positive for chest pain and leg swelling.  Gastrointestinal:  Positive for nausea and vomiting. Negative for blood in stool, constipation and diarrhea.  Genitourinary:  Negative for hematuria and vaginal bleeding.   Musculoskeletal:  Positive for flank pain (4/10 L side) and gait  problem (off balance).  Neurological:  Positive for dizziness, gait problem (off balance) and headaches.  Hematological:  Does not bruise/bleed easily.  Psychiatric/Behavioral:  Positive for sleep disturbance.   All other systems reviewed and are negative.   PHYSICAL EXAMINATION: ECOG PERFORMANCE STATUS: 1 - Symptomatic but completely ambulatory  Vitals:   05/14/21 0937  BP: (!) 157/85  Pulse: (!) 112  Resp: 18  Temp: 98.4 F (36.9 C)  SpO2: 100%   Filed Weights   05/14/21 0937  Weight: 170 lb (77.1 kg)   Physical Exam Vitals reviewed.  Constitutional:      Appearance: Normal appearance.     Comments: In wheelchair  Cardiovascular:     Rate and Rhythm: Normal rate and regular rhythm.     Pulses: Normal pulses.  Heart sounds: Normal heart sounds.  Pulmonary:     Effort: Pulmonary effort is normal.     Breath sounds: Normal breath sounds.  Abdominal:     Palpations: Abdomen is soft. There is no hepatomegaly, splenomegaly or mass.     Tenderness: There is no abdominal tenderness.  Musculoskeletal:     Right lower leg: 3+ Edema present.     Left lower leg: 3+ Edema present.  Lymphadenopathy:     Cervical: No cervical adenopathy.     Right cervical: No superficial cervical adenopathy.    Left cervical: No superficial cervical adenopathy.  Neurological:     General: No focal deficit present.     Mental Status: She is alert and oriented to person, place, and time.  Psychiatric:        Mood and Affect: Mood normal.        Behavior: Behavior normal.     LABORATORY DATA:  I have reviewed the data as listed No results found for this or any previous visit (from the past 2160 hour(s)).  RADIOGRAPHIC STUDIES: I have personally reviewed the radiological images as listed and agreed with the findings in the report. No results found.  ASSESSMENT:  Normocytic anemia: - Patient evaluated at the request of Dr. Marval Regal for work-up of normocytic anemia. - Labs from  01/08/2021 showed hemoglobin 7.9 with MCV 85.  White count and platelet count was normal.  Normal differential.  Ferritin was 155 and percent saturation was 25. - She has chronic kidney disease with creatinine around 1.5. - She denies any bleeding per rectum or melena.  She had a history of blood transfusion in 2021.  Denies ice pica.  No B symptoms. - EGD on 06/27/2019 with normal esophagus, status post Maloney dilation, focal abnormality in the gastric mucosa likely result of trauma (heaving).  Normal duodenal bulb and second part of duodenum.  2.  Social/family history: - She lives at home with her mother.  She used to work at [employer] but is on disability at this time.  She has no exposure to chemicals.  She is non-smoker. - She uses a cane to ambulate since April last year. - No family history of sickle cell or thalassemia.  Father had pancreatic cancer.   PLAN:  Normocytic anemia: - We have reviewed different etiologies of normocytic anemia.  Differential diagnosis includes anemia from chronic kidney disease with relative iron deficiency. - We will check CBC today.  We will check for nutritional deficiencies including T01, folic acid, MMA, copper, ferritin and iron panel.  Will check TSH.  We will also check for bone marrow infiltrative process with SPEP, free light chains.  We will rule out hemolysis by checking haptoglobin, direct Coombs test. - If related to iron deficiency confirmed, she will benefit from parenteral iron therapy.  If she has adequate iron reserves, she will likely need erythropoiesis stimulating agents. - RTC 1 week for follow-up.  2.  CKD stage IIIb: - Thought to be secondary to poorly controlled diabetes and acute kidney injury due to vancomycin toxicity.  She is on Lasix 40 mg twice daily. - She follows up with Dr. Marval Regal.   All questions were answered. The patient knows to call the clinic with any problems, questions or concerns.  Derek Jack, MD  05/14/21 9:58 AM  [facility] [phone]   I, Brendolyn Patty, am acting as a scribe for Dr. Derek Jack.  I, Derek Jack MD, have reviewed the above documentation for accuracy  and completeness, and I agree with the above.

## 2021-05-14 ENCOUNTER — Inpatient Hospital Stay (HOSPITAL_COMMUNITY): Payer: 59 | Attending: Hematology | Admitting: Hematology

## 2021-05-14 ENCOUNTER — Inpatient Hospital Stay (HOSPITAL_COMMUNITY): Payer: 59

## 2021-05-14 ENCOUNTER — Other Ambulatory Visit: Payer: Self-pay

## 2021-05-14 VITALS — BP 157/85 | HR 112 | Temp 98.4°F | Resp 18 | Ht 67.0 in | Wt 170.0 lb

## 2021-05-14 DIAGNOSIS — Z79899 Other long term (current) drug therapy: Secondary | ICD-10-CM | POA: Insufficient documentation

## 2021-05-14 DIAGNOSIS — E1022 Type 1 diabetes mellitus with diabetic chronic kidney disease: Secondary | ICD-10-CM | POA: Diagnosis not present

## 2021-05-14 DIAGNOSIS — Z794 Long term (current) use of insulin: Secondary | ICD-10-CM | POA: Diagnosis not present

## 2021-05-14 DIAGNOSIS — R1110 Vomiting, unspecified: Secondary | ICD-10-CM | POA: Diagnosis not present

## 2021-05-14 DIAGNOSIS — D649 Anemia, unspecified: Secondary | ICD-10-CM | POA: Insufficient documentation

## 2021-05-14 DIAGNOSIS — N1832 Chronic kidney disease, stage 3b: Secondary | ICD-10-CM | POA: Insufficient documentation

## 2021-05-14 DIAGNOSIS — Z800 Family history of malignant neoplasm of digestive organs: Secondary | ICD-10-CM | POA: Insufficient documentation

## 2021-05-14 DIAGNOSIS — E1042 Type 1 diabetes mellitus with diabetic polyneuropathy: Secondary | ICD-10-CM | POA: Insufficient documentation

## 2021-05-14 LAB — CBC WITH DIFFERENTIAL/PLATELET
Abs Immature Granulocytes: 0.03 10*3/uL (ref 0.00–0.07)
Basophils Absolute: 0.1 10*3/uL (ref 0.0–0.1)
Basophils Relative: 1 %
Eosinophils Absolute: 0.4 10*3/uL (ref 0.0–0.5)
Eosinophils Relative: 4 %
HCT: 26 % — ABNORMAL LOW (ref 36.0–46.0)
Hemoglobin: 8.2 g/dL — ABNORMAL LOW (ref 12.0–15.0)
Immature Granulocytes: 0 %
Lymphocytes Relative: 18 %
Lymphs Abs: 1.8 10*3/uL (ref 0.7–4.0)
MCH: 28.3 pg (ref 26.0–34.0)
MCHC: 31.5 g/dL (ref 30.0–36.0)
MCV: 89.7 fL (ref 80.0–100.0)
Monocytes Absolute: 0.3 10*3/uL (ref 0.1–1.0)
Monocytes Relative: 3 %
Neutro Abs: 7.4 10*3/uL (ref 1.7–7.7)
Neutrophils Relative %: 74 %
Platelets: 227 10*3/uL (ref 150–400)
RBC: 2.9 MIL/uL — ABNORMAL LOW (ref 3.87–5.11)
RDW: 14.5 % (ref 11.5–15.5)
WBC: 10 10*3/uL (ref 4.0–10.5)
nRBC: 0 % (ref 0.0–0.2)

## 2021-05-14 LAB — RETICULOCYTES
Immature Retic Fract: 12.8 % (ref 2.3–15.9)
RBC.: 2.9 MIL/uL — ABNORMAL LOW (ref 3.87–5.11)
Retic Count, Absolute: 34.8 10*3/uL (ref 19.0–186.0)
Retic Ct Pct: 1.2 % (ref 0.4–3.1)

## 2021-05-14 LAB — VITAMIN B12: Vitamin B-12: 1057 pg/mL — ABNORMAL HIGH (ref 180–914)

## 2021-05-14 LAB — IRON AND TIBC
Iron: 77 ug/dL (ref 28–170)
Saturation Ratios: 28 % (ref 10.4–31.8)
TIBC: 279 ug/dL (ref 250–450)
UIBC: 202 ug/dL

## 2021-05-14 LAB — LACTATE DEHYDROGENASE: LDH: 341 U/L — ABNORMAL HIGH (ref 98–192)

## 2021-05-14 LAB — FOLATE: Folate: 11.7 ng/mL (ref >5.9)

## 2021-05-14 LAB — DIRECT ANTIGLOBULIN TEST (NOT AT ARMC)
DAT, IgG: NEGATIVE
DAT, complement: NEGATIVE

## 2021-05-14 LAB — FERRITIN: Ferritin: 74 ng/mL (ref 11–307)

## 2021-05-14 LAB — TSH: TSH: 1.802 u[IU]/mL (ref 0.350–4.500)

## 2021-05-14 NOTE — Patient Instructions (Addendum)
[place] at [facility] Discharge Instructions   You were seen and examined today by Dr. Delton Coombes.  We will do extensive blood testing to see what may be causing her anemia.  Return as scheduled in 2 weeks to review lab results.    Thank you for choosing [place] at [facility] to provide your oncology and hematology care.  To afford each patient quality time with our provider, please arrive at least 15 minutes before your scheduled appointment time.   If you have a lab appointment with the [place] please come in thru the Main Entrance and check in at the main information desk.  You need to re-schedule your appointment should you arrive 10 or more minutes late.  We strive to give you quality time with our providers, and arriving late affects you and other patients whose appointments are after yours.  Also, if you no show three or more times for appointments you may be dismissed from the clinic at the providers discretion.     Again, thank you for choosing [facility].  Our hope is that these requests will decrease the amount of time that you wait before being seen by our physicians.       _____________________________________________________________  Should you have questions after your visit to [facility], please contact our office at [phone] and follow the prompts.  Our office hours are 8:00 a.m. and 4:30 p.m. Monday - Friday.  Please note that voicemails left after 4:00 p.m. may not be returned until the following business day.  We are closed weekends and major holidays.  You do have access to a nurse 24-7, just call the main number to the clinic [phone] and do not press any options, hold on the line and a nurse will answer the phone.    For prescription refill requests, have your pharmacy contact our office and allow 72 hours.    Due to Covid, you will need to wear a mask upon entering the  hospital. If you do not have a mask, a mask will be given to you at the Main Entrance upon arrival. For doctor visits, patients may have 1 support person age 76 or older with them. For treatment visits, patients can not have anyone with them due to social distancing guidelines and our immunocompromised population.

## 2021-05-15 LAB — KAPPA/LAMBDA LIGHT CHAINS
Kappa free light chain: 135.5 mg/L — ABNORMAL HIGH (ref 3.3–19.4)
Kappa, lambda light chain ratio: 2.11 — ABNORMAL HIGH (ref 0.26–1.65)
Lambda free light chains: 64.2 mg/L — ABNORMAL HIGH (ref 5.7–26.3)

## 2021-05-15 LAB — HAPTOGLOBIN: Haptoglobin: 240 mg/dL (ref 33–278)

## 2021-05-17 LAB — METHYLMALONIC ACID, SERUM: Methylmalonic Acid, Quantitative: 419 nmol/L — ABNORMAL HIGH (ref 0–378)

## 2021-05-18 LAB — PROTEIN ELECTROPHORESIS, SERUM
A/G Ratio: 0.6 — ABNORMAL LOW (ref 0.7–1.7)
Albumin ELP: 1.9 g/dL — ABNORMAL LOW (ref 2.9–4.4)
Alpha-1-Globulin: 0.2 g/dL (ref 0.0–0.4)
Alpha-2-Globulin: 1.1 g/dL — ABNORMAL HIGH (ref 0.4–1.0)
Beta Globulin: 0.9 g/dL (ref 0.7–1.3)
Gamma Globulin: 1.1 g/dL (ref 0.4–1.8)
Globulin, Total: 3.4 g/dL (ref 2.2–3.9)
Total Protein ELP: 5.3 g/dL — ABNORMAL LOW (ref 6.0–8.5)

## 2021-05-18 LAB — COPPER, SERUM: Copper: 103 ug/dL (ref 80–158)

## 2021-05-19 ENCOUNTER — Other Ambulatory Visit: Payer: Self-pay | Admitting: Physician Assistant

## 2021-05-19 ENCOUNTER — Other Ambulatory Visit: Payer: Self-pay | Admitting: Radiology

## 2021-05-19 NOTE — H&P (Signed)
Chief Complaint: Patient was seen in consultation today for random renal biopsy at the request of Leonidas  Referring Physician(s): Coladonato,Joseph  Supervising Physician: Sandi Mariscal  Patient Status: [facility] - Out-pt  History of Present Illness: Lindsey Murillo is a 26 y.o. female with PMHs of DM type I, HTN, CKD stage G3 B/A3 who has been followed by Dr. Marval Regal from nephrology. Patient showed worsening of her kidney function in September 2022, a random renal biopsy was recommended to the patient for further evaluation, and patient presented to IR on 02/12/2021 for the random renal biopsy; however, biopsy was rescheduled due to uncontrolled high blood pressure.  Patient was again seen by Dr. Marval Regal on 04/20/2021, when it was revealed that patient has nephrotic range found proteinuria.  A random renal biopsy was again recommended to the patient by nephrology, after thorough discussion and shared decision making, patient decided to proceed with random renal biopsy.  IR was requested for image guided random renal biopsy. Patient presents to [facility] IR today for the procedure.   Patient laying in bed, not in acute distress.  Denise headache, fever, chills, shortness of breath, cough, chest pain, abdominal pain, nausea ,vomiting, and bleeding.   Past Medical History:  Diagnosis Date   Acanthosis nigricans, acquired    Asthma    Diabetic autonomic neuropathy ([name])    Diabetic peripheral neuropathy ([name])    Environmental allergies    Goiter    Hypoglycemia associated with diabetes ([name])    Tachycardia    Thyroiditis, autoimmune    Type 1 diabetes mellitus in patient age 18-19 years with HbA1C goal below 7.5     Past Surgical History:  Procedure Laterality Date   BIOPSY  08/27/2016   Procedure: BIOPSY;  Surgeon: Danie Binder, MD;  Location: [facility] ENDO SUITE;  Service: Endoscopy;;  duodenum; gastric   BIOPSY  06/27/2019   Procedure: BIOPSY;  Surgeon: Daneil Dolin,  MD;  Location: [facility] ENDO SUITE;  Service: Endoscopy;;   COLONOSCOPY     ESOPHAGOGASTRODUODENOSCOPY N/A 08/27/2016   Dr. Oneida Alar: mild gastritis. Negative celiac. No obvious source for dyspepsia/diarrhea   ESOPHAGOGASTRODUODENOSCOPY (EGD) WITH PROPOFOL N/A 06/27/2019   rourk: Focal abnormality of the gastric mucosa likely due to trauma (heaving).  Biopsy showed mild gastritis, negative for H. pylori.  Esophageal dilation for history of dysphagia but normal-appearing esophagus.    Allergies: Patient has no known allergies.  Medications: Prior to Admission medications   Medication Sig Start Date End Date Taking? Authorizing Provider  acetaminophen (TYLENOL) 325 MG tablet Take 650 mg by mouth every 6 (six) hours as needed (for pain). Patient not taking: Reported on 05/14/2021   Yes [provider]  Cholecalciferol (VITAMIN D3) 125 MCG (5000 UT) TABS Take 5,000 Units by mouth once a week.   Yes [provider]  FARXIGA 10 MG TABS tablet Take 10 mg by mouth in the morning. 02/05/21  Yes [provider]  furosemide (LASIX) 40 MG tablet Take 40 mg by mouth 2 (two) times daily. 04/28/20  Yes [provider]  glucagon 1 MG injection Follow package directions for low blood sugar. Patient taking differently: Inject 1 mg into the skin once as needed. Follow package directions for low blood sugar. 01/05/12  Yes Sherrlyn Hock, MD  insulin aspart (NOVOLOG FLEXPEN) 100 UNIT/ML FlexPen Inject 8-11 Units into the skin 3 (three) times daily with meals. Patient taking differently: Inject 0-10 Units into the skin 3 (three) times daily before meals. 0-90=no insulin  91-150=2 units 151-200=4 units 201-250=6 units 251-349=8 units  350 and up=10 units 03/20/19  Yes Nida, Marella Chimes, MD  insulin glargine (LANTUS) 100 UNIT/ML injection Inject 0.15 mLs (15 Units total) into the skin at bedtime. Patient taking differently: Inject 0-5 Units into the skin at bedtime. 0-99=no insulin  100 and up=5 units 06/12/19  Yes Shah, Pratik D, DO  lisinopril (ZESTRIL) 20 MG tablet Take 20 mg by mouth daily. 04/28/20  Yes [provider]     Family History  Problem Relation Age of Onset   Diabetes Mother        Type II DM   Thyroid disease Mother    Diabetes Maternal Grandmother        Type II DM   Diabetes Cousin        Type II DM   Colon cancer Neg Hx    Colon polyps Neg Hx     Social History   Socioeconomic History   Marital status: Single    Spouse name: Not on file   Number of children: Not on file   Years of education: Not on file   Highest education level: Not on file  Occupational History   Not on file  Tobacco Use   Smoking status: Never   Smokeless tobacco: Never  Vaping Use   Vaping Use: Never used  Substance and Sexual Activity   Alcohol use: No   Drug use: No   Sexual activity: Never    Birth control/protection: Abstinence  Other Topics Concern   Not on file  Social History Narrative   Not on file   Social Determinants of Health   Financial Resource Strain: Not on file  Food Insecurity: Not on file  Transportation Needs: Not on file  Physical Activity: Not on file  Stress: Not on file  Social Connections: Not on file     Review of Systems: A 12 point ROS discussed and pertinent positives are indicated in the HPI above.  All other systems are negative.  Vital Signs: BP (!) 149/109 Comment: Amy PA called with bp update   Pulse (!) 103    Temp 98.2 F (36.8 C) (Oral)    Ht 5\' 7"  (1.702 m)    Wt 175 lb (79.4 kg)    BMI 27.41 kg/m    Physical Exam Vitals and nursing note reviewed.  Constitutional:      General: Patient is not in acute distress.    Appearance: Normal appearance. Patient is not ill-appearing.  HENT:     Head: Normocephalic and atraumatic.     Mouth/Throat:     Mouth: Mucous membranes are moist.     Pharynx: Oropharynx is clear.  Cardiovascular:     Rate and Rhythm: Normal rate tachycardia    Pulses: Normal  pulses.     Heart sounds: Murmur at PV and AV Pulmonary:     Effort: Pulmonary effort is normal.     Breath sounds: Normal breath sounds.  Abdominal:     General: Abdomen is flat. Bowel sounds are normal.     Palpations: Abdomen is soft.  Musculoskeletal:     Cervical back: Neck supple.  Skin:    General: Skin is warm and dry.     Coloration: Skin is not jaundiced or pale.  Neurological:     Mental Status: Patient is alert and oriented to person, place, and time.  Psychiatric:        Mood and Affect: Mood normal.  Behavior: Behavior normal.        Judgment: Judgment normal.    MD Evaluation Airway: WNL Heart: WNL Abdomen: WNL Chest/ Lungs: WNL ASA  Classification: 3 Mallampati/Airway Score: Two  Imaging: No results found.  Labs:  CBC: Recent Labs    02/12/21 0630 05/14/21 1040 05/20/21 0657  WBC 10.2 10.0 8.4  HGB 9.0* 8.2* 7.8*  HCT 27.3* 26.0* 24.4*  PLT 226 227 221    COAGS: Recent Labs    02/12/21 0630 05/20/21 0657  INR 0.9 0.8    BMP: No results for input(s): NA, K, CL, CO2, GLUCOSE, BUN, CALCIUM, CREATININE, GFRNONAA, GFRAA in the last 8760 hours.  Invalid input(s): CMP  LIVER FUNCTION TESTS: No results for input(s): BILITOT, AST, ALT, ALKPHOS, PROT, ALBUMIN in the last 8760 hours.  TUMOR MARKERS: No results for input(s): AFPTM, CEA, CA199, CHROMGRNA in the last 8760 hours.  Assessment and Plan: 26 y.o. female with hx CKD 3 and DM type 1 ith recent finding of nephrotic range of proteinuria, who is in need of renal tissue sampling for further evaluation and management per nephrology.  I was requested for image guided random renal biopsy. Patient presents to [facility] IR today for the procedure. N.p.o. since midnight VSS 149/109 -patient on lisinopril 20 mg daily and furosemide 40 mg twice daily, she reports that she  took her medications this morning.  CBC stable INR 0.8  Not on AC/AP  Risks and benefits of random renal biopsy was  discussed with the patient and/or patient's family including, but not limited to bleeding, infection, damage to adjacent structures or low yield requiring additional tests.  All of the questions were answered and there is agreement to proceed.  Consent signed and in chart.   Thank you for this interesting consult.  I greatly enjoyed meeting Haydyn R Sames and look forward to participating in their care.  A copy of this report was sent to the requesting provider on this date.  Electronically Signed: Tera Mater, PA-C 05/20/2021, 7:53 AM   I spent a total of  30 Minutes   in face to face in clinical consultation, greater than 50% of which was counseling/coordinating care for random renal biopsy.  This chart was dictated using voice recognition software.  Despite best efforts to proofread,  errors can occur which can change the documentation meaning.

## 2021-05-20 ENCOUNTER — Ambulatory Visit (HOSPITAL_COMMUNITY)
Admission: RE | Admit: 2021-05-20 | Discharge: 2021-05-20 | Disposition: A | Discharge status: Home / Self Care | Payer: 59 | Source: Ambulatory Visit | Attending: Nephrology | Admitting: Nephrology

## 2021-05-20 ENCOUNTER — Other Ambulatory Visit: Payer: Self-pay

## 2021-05-20 ENCOUNTER — Encounter (HOSPITAL_COMMUNITY): Payer: Self-pay

## 2021-05-20 DIAGNOSIS — I1310 Hypertensive heart and chronic kidney disease without heart failure, with stage 1 through stage 4 chronic kidney disease, or unspecified chronic kidney disease: Secondary | ICD-10-CM | POA: Diagnosis not present

## 2021-05-20 DIAGNOSIS — N1832 Chronic kidney disease, stage 3b: Secondary | ICD-10-CM | POA: Insufficient documentation

## 2021-05-20 DIAGNOSIS — R809 Proteinuria, unspecified: Secondary | ICD-10-CM | POA: Insufficient documentation

## 2021-05-20 DIAGNOSIS — N049 Nephrotic syndrome with unspecified morphologic changes: Secondary | ICD-10-CM | POA: Diagnosis not present

## 2021-05-20 DIAGNOSIS — E1029 Type 1 diabetes mellitus with other diabetic kidney complication: Secondary | ICD-10-CM | POA: Insufficient documentation

## 2021-05-20 LAB — PROTIME-INR
INR: 0.8 (ref 0.8–1.2)
Prothrombin Time: 11.5 seconds (ref 11.4–15.2)

## 2021-05-20 LAB — CBC
HCT: 24.4 % — ABNORMAL LOW (ref 36.0–46.0)
Hemoglobin: 7.8 g/dL — ABNORMAL LOW (ref 12.0–15.0)
MCH: 28.7 pg (ref 26.0–34.0)
MCHC: 32 g/dL (ref 30.0–36.0)
MCV: 89.7 fL (ref 80.0–100.0)
Platelets: 221 10*3/uL (ref 150–400)
RBC: 2.72 MIL/uL — ABNORMAL LOW (ref 3.87–5.11)
RDW: 14.3 % (ref 11.5–15.5)
WBC: 8.4 10*3/uL (ref 4.0–10.5)
nRBC: 0 % (ref 0.0–0.2)

## 2021-05-20 LAB — GLUCOSE, CAPILLARY
Glucose-Capillary: 129 mg/dL — ABNORMAL HIGH (ref 70–99)
Glucose-Capillary: 142 mg/dL — ABNORMAL HIGH (ref 70–99)

## 2021-05-20 MED ORDER — HYDRALAZINE HCL 20 MG/ML IJ SOLN
INTRAMUSCULAR | Status: AC
  Filled 2021-05-20: qty 1

## 2021-05-20 MED ORDER — SODIUM CHLORIDE 0.9 % IV SOLN: INTRAVENOUS | Status: DC

## 2021-05-20 MED ORDER — MIDAZOLAM HCL 2 MG/2ML IJ SOLN
INTRAMUSCULAR | Status: AC
  Filled 2021-05-20: qty 2

## 2021-05-20 MED ORDER — GELATIN ABSORBABLE 12-7 MM EX MISC
CUTANEOUS | Status: AC
  Filled 2021-05-20: qty 1

## 2021-05-20 MED ORDER — MIDAZOLAM HCL 2 MG/2ML IJ SOLN
INTRAMUSCULAR | Status: AC | PRN
  Administered 2021-05-20 (×2): 1 mg via INTRAVENOUS

## 2021-05-20 MED ORDER — LIDOCAINE-EPINEPHRINE 1 %-1:100000 IJ SOLN
INTRAMUSCULAR | Status: AC
  Filled 2021-05-20: qty 1

## 2021-05-20 MED ORDER — FENTANYL CITRATE (PF) 100 MCG/2ML IJ SOLN
INTRAMUSCULAR | Status: AC
  Filled 2021-05-20: qty 2

## 2021-05-20 MED ORDER — FENTANYL CITRATE (PF) 100 MCG/2ML IJ SOLN
INTRAMUSCULAR | Status: AC | PRN
Start: 2021-05-20 — End: 2021-05-20
  Administered 2021-05-20 (×2): 50 ug via INTRAVENOUS

## 2021-05-20 NOTE — Procedures (Signed)
Pre Procedure Dx: Proteinuria Post Procedural Dx: Same  Technically successful US guided biopsy of inferior pole of the left kidney  EBL: Trace Procedure complicated by development of a tiny perinephric hematoma.   Ronny Bacon, MD Pager #: [phone]

## 2021-05-25 ENCOUNTER — Encounter (HOSPITAL_COMMUNITY): Payer: Self-pay

## 2021-05-25 LAB — SURGICAL PATHOLOGY

## 2021-05-26 ENCOUNTER — Ambulatory Visit (INDEPENDENT_AMBULATORY_CARE_PROVIDER_SITE_OTHER): Payer: 59 | Admitting: Family Medicine

## 2021-05-26 ENCOUNTER — Telehealth: Payer: Self-pay | Admitting: "Endocrinology

## 2021-05-26 ENCOUNTER — Other Ambulatory Visit: Payer: Self-pay

## 2021-05-26 VITALS — BP 176/117 | HR 107 | Temp 98.5°F | Wt 182.2 lb

## 2021-05-26 DIAGNOSIS — D649 Anemia, unspecified: Secondary | ICD-10-CM

## 2021-05-26 DIAGNOSIS — E049 Nontoxic goiter, unspecified: Secondary | ICD-10-CM

## 2021-05-26 DIAGNOSIS — E785 Hyperlipidemia, unspecified: Secondary | ICD-10-CM | POA: Diagnosis not present

## 2021-05-26 DIAGNOSIS — E1065 Type 1 diabetes mellitus with hyperglycemia: Secondary | ICD-10-CM

## 2021-05-26 DIAGNOSIS — I10 Essential (primary) hypertension: Secondary | ICD-10-CM

## 2021-05-26 LAB — GLUCOSE, POCT (MANUAL RESULT ENTRY): POC Glucose: 160 mg/dl — AB (ref 70–99)

## 2021-05-26 MED ORDER — BLOOD GLUCOSE MONITOR KIT: PACK | 0 refills | Status: DC

## 2021-05-26 MED ORDER — INSULIN PEN NEEDLE 31G X 4 MM MISC: 1 refills | Status: DC

## 2021-05-26 MED ORDER — INSULIN GLARGINE 100 UNIT/ML ~~LOC~~ SOLN
10.0000 [IU] | Freq: Every day | SUBCUTANEOUS | 3 refills | Status: DC
Start: 2021-05-26 — End: 2021-06-02

## 2021-05-26 MED ORDER — NOVOLOG FLEXPEN 100 UNIT/ML ~~LOC~~ SOPN: 8.0000 [IU] | PEN_INJECTOR | Freq: Three times a day (TID) | SUBCUTANEOUS | 1 refills | Status: DC

## 2021-05-26 NOTE — Addendum Note (Signed)
Addended by: Coral Spikes on: 05/26/2021 12:28 PM   Modules accepted: Orders

## 2021-05-26 NOTE — Assessment & Plan Note (Signed)
Will discuss with hematology oncology.

## 2021-05-26 NOTE — Assessment & Plan Note (Signed)
Patient states that she is taking her medications as prescribed.  However, given the fact that her blood pressure is markedly elevated and her history of noncompliance, I suspect that she is noncompliant.  We will wait on laboratory studies before making changes to her treatment regimen.

## 2021-05-26 NOTE — Patient Instructions (Signed)
Labs today.  Insulin as directed.  I am referring you to endo.  Follow up in 1 month.

## 2021-05-26 NOTE — Telephone Encounter (Signed)
Received referral on patient, last office visit was 2020. I called pt to be seen. She needs to complete the labs Dr Lacinda Axon ordered and then see [place] as soon as she can.

## 2021-05-26 NOTE — Assessment & Plan Note (Signed)
Unsure of control.  Patient has a longstanding history of noncompliance.  Labs ordered today including A1c.  Starting Lantus 10 units and NovoLog as well.  Referring to endocrinology.

## 2021-05-26 NOTE — Progress Notes (Signed)
Subjective:  Patient ID: Lindsey Murillo, female    DOB: Nov 14, 1995  Age: 26 y.o. MRN: [id number]  CC: Chief Complaint  Patient presents with   Establish Care    Patient has been out of medication dealing with her diabetes.    HPI:  26 year old female with a complicated past medical history including uncontrolled type 1 diabetes with complications including chronic kidney disease, neuropathy, anemia presents to establish care with me.  She has not been seen in our office since 2021.  Patient is accompanied by her mother.  Patient states that she is following with nephrology.  Records unavailable.  She has recently seen Dr. Delton Coombes regarding ongoing anemia.  Most recent hemoglobin was 7.8.  Patient states that she feels okay.  She has severe edema of the lower extremities.  Also gets short of breath with activity.  She has difficulty ambulating and uses a cane.  Patient is a type I diabetic and has not been on insulin for more than a year per her report.  Blood glucose checked in the office and was 160.  She was previously on Lantus and NovoLog.  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   Vitamin D deficiency 03/20/2019   Uncontrolled type 1 diabetes mellitus with hyperglycemia ([place]) 05/12/2015   Noncompliance 02/26/2014   Essential hypertension, benign 11/30/2012   Goiter    Diabetic autonomic neuropathy ([name])    Diabetic peripheral neuropathy ([name])    Asthma     Social Hx   Social History   Socioeconomic History   Marital status: Single    Spouse name: Not on file   Number of children: Not on file   Years of education: Not on file   Highest education level: Not on file  Occupational History   Not on file  Tobacco Use   Smoking status: Never    Smokeless tobacco: Never  Vaping Use   Vaping Use: Never used  Substance and Sexual Activity   Alcohol use: No   Drug use: No   Sexual activity: Never    Birth control/protection: Abstinence  Other Topics Concern   Not on file  Social History Narrative   Not on file   Social Determinants of Health   Financial Resource Strain: Not on file  Food Insecurity: Not on file  Transportation Needs: Not on file  Physical Activity: Not on file  Stress: Not on file  Social Connections: Not on file    Review of Systems Per HPI  Objective:  BP (!) 176/117    Pulse (!) 107    Temp 98.5 F (36.9 C) (Oral)    Wt 182 lb 3.2 oz (82.6 kg)    SpO2 100%    BMI 28.54 kg/m   BP/Weight 05/26/2021 1/93/7902 4/0/9735  Systolic BP 329 924 268  Diastolic BP 341 962 85  Wt. (Lbs) 182.2 175 170  BMI 28.54 27.41 26.63    Physical Exam Vitals and nursing note reviewed.  Constitutional:      General: She is not in acute distress.    Appearance: Normal appearance. She is not ill-appearing.  HENT:     Head: Normocephalic and atraumatic.  Cardiovascular:     Rate and Rhythm: Regular rhythm. Tachycardia present.  Pulmonary:     Effort: Pulmonary effort is normal.  Breath sounds: No wheezing or rales.  Musculoskeletal:     Comments: Severe lower extremity edema bilaterally with chronic skin thickening.  Edema extends to the thigh.  Neurological:     Mental Status: She is alert.  Psychiatric:     Comments: Flat affect.  Depressed mood.    Lab Results  Component Value Date   WBC 8.4 05/20/2021   HGB 7.8 (L) 05/20/2021   HCT 24.4 (L) 05/20/2021   PLT 221 05/20/2021   GLUCOSE 227 (H) 10/27/2019   CHOL 147 11/05/2015   TRIG 62 11/05/2015   HDL 75 11/05/2015   LDLCALC 60 11/05/2015   ALT 9 06/24/2019   AST 10 (L) 06/24/2019   NA 130 (L) 10/27/2019   K 4.0 10/27/2019   CL 92 (L) 10/27/2019   CREATININE 0.92 10/27/2019   BUN 34 (H) 10/27/2019   CO2 27 10/27/2019   TSH 1.802 05/14/2021    INR 0.8 05/20/2021   HGBA1C 9.3 (H) 05/09/2019   MICROALBUR 0.50 11/28/2012     Assessment & Plan:   Problem List Items Addressed This Visit       Cardiovascular and Mediastinum   Essential hypertension, benign    Patient states that she is taking her medications as prescribed.  However, given the fact that her blood pressure is markedly elevated and her history of noncompliance, I suspect that she is noncompliant.  We will wait on laboratory studies before making changes to her treatment regimen.        Endocrine   Goiter   Relevant Orders   TSH   Uncontrolled type 1 diabetes mellitus with hyperglycemia ([place]) - Primary    Unsure of control.  Patient has a longstanding history of noncompliance.  Labs ordered today including A1c.  Starting Lantus 10 units and NovoLog as well.  Referring to endocrinology.      Relevant Medications   insulin aspart (NOVOLOG FLEXPEN) 100 UNIT/ML FlexPen   insulin glargine (LANTUS) 100 UNIT/ML injection   Other Relevant Orders   CMP14+EGFR   Microalbumin / creatinine urine ratio   POCT Glucose (CBG) (Completed)     Other   Normocytic anemia    Will discuss with hematology oncology.      Relevant Orders   CBC   Other Visit Diagnoses     Hyperlipidemia, unspecified hyperlipidemia type       Relevant Orders   Lipid panel       Meds ordered this encounter  Medications   insulin aspart (NOVOLOG FLEXPEN) 100 UNIT/ML FlexPen    Sig: Inject 8-11 Units into the skin 3 (three) times daily with meals.    Dispense:  15 mL    Refill:  1   insulin glargine (LANTUS) 100 UNIT/ML injection    Sig: Inject 0.1 mLs (10 Units total) into the skin daily.    Dispense:  10 mL    Refill:  3   Insulin Pen Needle 31G X 4 MM MISC    Sig: Use as directed to administer insulin.    Dispense:  100 each    Refill:  1   blood glucose meter kit and supplies KIT    Sig: Dispense based on patient and insurance preference. Use up to four times daily as  directed.    Dispense:  1 each    Refill:  0    Order Specific Question:   Number of strips    Answer:   100    Order Specific Question:   Number  of lancets    Answer:   100    Follow-up:  Return in about 1 month (around 06/26/2021).  [place]

## 2021-05-27 ENCOUNTER — Encounter: Payer: Self-pay | Admitting: Family Medicine

## 2021-05-27 DIAGNOSIS — N049 Nephrotic syndrome with unspecified morphologic changes: Secondary | ICD-10-CM | POA: Insufficient documentation

## 2021-05-27 LAB — CMP14+EGFR
ALT: 24 IU/L (ref 0–32)
AST: 31 IU/L (ref 0–40)
Albumin/Globulin Ratio: 0.7 — ABNORMAL LOW (ref 1.2–2.2)
Albumin: 2.3 g/dL — ABNORMAL LOW (ref 3.9–5.0)
Alkaline Phosphatase: 217 IU/L — ABNORMAL HIGH (ref 44–121)
BUN/Creatinine Ratio: 14 (ref 9–23)
BUN: 33 mg/dL — ABNORMAL HIGH (ref 6–20)
Bilirubin Total: <0.2 mg/dL (ref 0.0–1.2)
CO2: 15 mmol/L — ABNORMAL LOW (ref 20–29)
Calcium: 8 mg/dL — ABNORMAL LOW (ref 8.7–10.2)
Chloride: 113 mmol/L — ABNORMAL HIGH (ref 96–106)
Creatinine, Ser: 2.32 mg/dL — ABNORMAL HIGH (ref 0.57–1.00)
Globulin, Total: 3.1 g/dL (ref 1.5–4.5)
Glucose: 138 mg/dL — ABNORMAL HIGH (ref 70–99)
Potassium: 5.4 mmol/L — ABNORMAL HIGH (ref 3.5–5.2)
Sodium: 141 mmol/L (ref 134–144)
Total Protein: 5.4 g/dL — ABNORMAL LOW (ref 6.0–8.5)
eGFR: 29 mL/min/{1.73_m2} — ABNORMAL LOW (ref >59)

## 2021-05-27 LAB — CBC
Hematocrit: 25.1 % — ABNORMAL LOW (ref 34.0–46.6)
Hemoglobin: 8 g/dL — ABNORMAL LOW (ref 11.1–15.9)
MCH: 28.1 pg (ref 26.6–33.0)
MCHC: 31.9 g/dL (ref 31.5–35.7)
MCV: 88 fL (ref 79–97)
Platelets: 241 10*3/uL (ref 150–450)
RBC: 2.85 x10E6/uL — ABNORMAL LOW (ref 3.77–5.28)
RDW: 14.4 % (ref 11.7–15.4)
WBC: 8.7 10*3/uL (ref 3.4–10.8)

## 2021-05-27 LAB — LIPID PANEL
Chol/HDL Ratio: 2.7 ratio (ref 0.0–4.4)
Cholesterol, Total: 214 mg/dL — ABNORMAL HIGH (ref 100–199)
HDL: 80 mg/dL (ref >39)
LDL Chol Calc (NIH): 109 mg/dL — ABNORMAL HIGH (ref 0–99)
Triglycerides: 146 mg/dL (ref 0–149)
VLDL Cholesterol Cal: 25 mg/dL (ref 5–40)

## 2021-05-27 LAB — TSH: TSH: 2.52 u[IU]/mL (ref 0.450–4.500)

## 2021-05-27 LAB — MICROALBUMIN / CREATININE URINE RATIO
Creatinine, Urine: 39.6 mg/dL
Microalb/Creat Ratio: 5907 mg/g creat — ABNORMAL HIGH (ref 0–29)
Microalbumin, Urine: 2339 ug/mL

## 2021-05-27 LAB — SPECIMEN STATUS REPORT

## 2021-05-29 ENCOUNTER — Encounter (HOSPITAL_COMMUNITY): Payer: Self-pay

## 2021-06-01 ENCOUNTER — Other Ambulatory Visit: Payer: Self-pay

## 2021-06-01 ENCOUNTER — Inpatient Hospital Stay (HOSPITAL_BASED_OUTPATIENT_CLINIC_OR_DEPARTMENT_OTHER): Payer: 59 | Admitting: Hematology

## 2021-06-01 ENCOUNTER — Other Ambulatory Visit: Payer: Self-pay | Admitting: Family Medicine

## 2021-06-01 ENCOUNTER — Inpatient Hospital Stay (HOSPITAL_COMMUNITY): Payer: 59

## 2021-06-01 VITALS — BP 161/99 | HR 94 | Temp 96.7°F | Resp 20 | Wt 180.2 lb

## 2021-06-01 DIAGNOSIS — D649 Anemia, unspecified: Secondary | ICD-10-CM | POA: Diagnosis not present

## 2021-06-01 MED ORDER — CYANOCOBALAMIN 1000 MCG/ML IJ SOLN
1000.0000 ug | Freq: Once | INTRAMUSCULAR | Status: AC
  Administered 2021-06-01: 1000 ug via INTRAMUSCULAR
  Filled 2021-06-01: qty 1

## 2021-06-01 MED ORDER — ROSUVASTATIN CALCIUM 10 MG PO TABS: 10.0000 mg | ORAL_TABLET | Freq: Every day | ORAL | 3 refills | Status: DC

## 2021-06-01 NOTE — Patient Instructions (Signed)
[place] at [facility] Discharge Instructions  You were seen and examined today by Dr. Delton Coombes. He reviewed your most recent labs and your Iron is low. Dr. Delton Coombes recommends that you start taking Vitamin B12 1000 mcg over the counter daily. We will get you set up to have IV iron. Please keep follow up appointment as scheduled in 6 weeks.    Thank you for choosing [place] at [facility] to provide your oncology and hematology care.  To afford each patient quality time with our provider, please arrive at least 15 minutes before your scheduled appointment time.   If you have a lab appointment with the [place] please come in thru the Main Entrance and check in at the main information desk.  You need to re-schedule your appointment should you arrive 10 or more minutes late.  We strive to give you quality time with our providers, and arriving late affects you and other patients whose appointments are after yours.  Also, if you no show three or more times for appointments you may be dismissed from the clinic at the providers discretion.     Again, thank you for choosing [facility].  Our hope is that these requests will decrease the amount of time that you wait before being seen by our physicians.       _____________________________________________________________  Should you have questions after your visit to [facility], please contact our office at [phone] and follow the prompts.  Our office hours are 8:00 a.m. and 4:30 p.m. Monday - Friday.  Please note that voicemails left after 4:00 p.m. may not be returned until the following business day.  We are closed weekends and major holidays.  You do have access to a nurse 24-7, just call the main number to the clinic [phone] and do not press any options, hold on the line and a nurse will answer the phone.    For prescription refill requests, have your  pharmacy contact our office and allow 72 hours.    Due to Covid, you will need to wear a mask upon entering the hospital. If you do not have a mask, a mask will be given to you at the Main Entrance upon arrival. For doctor visits, patients may have 1 support person age 23 or older with them. For treatment visits, patients can not have anyone with them due to social distancing guidelines and our immunocompromised population.

## 2021-06-01 NOTE — Progress Notes (Signed)
Patient in for office visit today. Vitamin B 12 ordered by Dr. Delton Coombes. Injection was ministered in right deltoid. Patient remained stable throughout injection. Patient was discharged with guardian in wheelchair in stable condition.

## 2021-06-01 NOTE — Progress Notes (Signed)
° °[facility] °[address] °[place], [place] [address] ° ° °CLINIC:  °Medical Oncology/Hematology ° °PCP:  °Murillo, Lindsey G, DO °[address] / [place] [address]  °[phone] ° °REASON FOR VISIT:  °Follow-up for normocytic anemia ° °PRIOR THERAPY: none ° °CURRENT THERAPY: under work-up ° °INTERVAL HISTORY:  °[name], a 25 y.o. female, returns for routine follow-up for her normocytic anemia. Lindsey Murillo was last seen on 05/14/2021. ° °Today she reports feeling good. She denies history of iron infusions. She denies current bleeding and black stools.  ° °REVIEW OF SYSTEMS:  °Review of Systems  °Constitutional:  Negative for appetite change and fatigue.  °HENT:   Negative for nosebleeds.   °Respiratory:  Positive for cough and shortness of breath. Negative for hemoptysis.   °Gastrointestinal:  Negative for blood in stool.  °Genitourinary:  Negative for hematuria.   °Hematological:  Does not bruise/bleed easily.  °All other systems reviewed and are negative. ° °PAST MEDICAL/SURGICAL HISTORY:  °Past Medical History:  °Diagnosis Date  ° Acanthosis nigricans, acquired   ° Asthma   ° Diabetic autonomic neuropathy ([name])   ° Diabetic peripheral neuropathy ([name])   ° Environmental allergies   ° Goiter   ° Hypoglycemia associated with diabetes ([name])   ° Tachycardia   ° Thyroiditis, autoimmune   ° Type 1 diabetes mellitus in patient age 13-19 years with HbA1C goal below 7.5   ° °Past Surgical History:  °Procedure Laterality Date  ° BIOPSY  08/27/2016  ° Procedure: BIOPSY;  Surgeon: Lindsey L Fields, MD;  Location: [facility];  Service: Endoscopy;;  duodenum; gastric  ° BIOPSY  06/27/2019  ° Procedure: BIOPSY;  Surgeon: Murillo, Lindsey M, MD;  Location: [facility];  Service: Endoscopy;;  ° COLONOSCOPY    ° ESOPHAGOGASTRODUODENOSCOPY N/A 08/27/2016  ° Dr. Fields: mild gastritis. Negative celiac. No obvious source for dyspepsia/diarrhea  ° ESOPHAGOGASTRODUODENOSCOPY (EGD) WITH PROPOFOL N/A 06/27/2019   ° Murillo: Focal abnormality of the gastric mucosa likely due to trauma (heaving).  Biopsy showed mild gastritis, negative for H. pylori.  Esophageal dilation for history of dysphagia but normal-appearing esophagus.  ° ° °SOCIAL HISTORY:  °Social History  ° °Socioeconomic History  ° Marital status: Single  °  Spouse name: Not on file  ° Number of children: Not on file  ° Years of education: Not on file  ° Highest education level: Not on file  °Occupational History  ° Not on file  °Tobacco Use  ° Smoking status: Never  ° Smokeless tobacco: Never  °Vaping Use  ° Vaping Use: Never used  °Substance and Sexual Activity  ° Alcohol use: No  ° Drug use: No  ° Sexual activity: Never  °  Birth control/protection: Abstinence  °Other Topics Concern  ° Not on file  °Social History Narrative  ° Not on file  ° °Social Determinants of Health  ° °Financial Resource Strain: Not on file  °Food Insecurity: Not on file  °Transportation Needs: Not on file  °Physical Activity: Not on file  °Stress: Not on file  °Social Connections: Not on file  °Intimate Partner Violence: Not on file  ° ° °FAMILY HISTORY:  °Family History  °Problem Relation Age of Onset  ° Diabetes Mother   °     Type II DM  ° Thyroid disease Mother   ° Diabetes Maternal Grandmother   °     Type II DM  ° Diabetes Cousin   °       Type II DM   Colon cancer Neg Hx    Colon polyps Neg Hx     CURRENT MEDICATIONS:  Current Outpatient Medications  Medication Sig Dispense Refill   blood glucose meter kit and supplies KIT Dispense based on patient and insurance preference. Use up to four times daily as directed. 1 each 0   Cholecalciferol (VITAMIN D3) 125 MCG (5000 UT) TABS Take 5,000 Units by mouth once a week.     furosemide (LASIX) 40 MG tablet Take 40 mg by mouth 2 (two) times daily.     insulin aspart (NOVOLOG FLEXPEN) 100 UNIT/ML FlexPen Inject 8-11 Units into the skin 3 (three) times daily with meals. 15 mL 1   insulin glargine (LANTUS) 100 UNIT/ML injection  Inject 0.1 mLs (10 Units total) into the skin daily. 10 mL 3   insulin glargine-yfgn (SEMGLEE) 100 UNIT/ML injection SMARTSIG:10 Unit(s) SUB-Q Daily     Insulin Pen Needle 31G X 4 MM MISC Use as directed to administer insulin. 100 each 1   lisinopril (ZESTRIL) 20 MG tablet Take 20 mg by mouth daily.     rosuvastatin (CRESTOR) 10 MG tablet Take 1 tablet (10 mg total) by mouth daily. 90 tablet 3   acetaminophen (TYLENOL) 325 MG tablet Take 650 mg by mouth every 6 (six) hours as needed (for pain). (Patient not taking: Reported on 06/01/2021)     ibuprofen (ADVIL) 800 MG tablet Take 800 mg by mouth every 6 (six) hours as needed. (Patient not taking: Reported on 06/01/2021)     No current facility-administered medications for this visit.    ALLERGIES:  No Known Allergies  PHYSICAL EXAM:  Performance status (ECOG): 1 - Symptomatic but completely ambulatory  Vitals:   06/01/21 1423  BP: (!) 161/99  Pulse: 94  Resp: 20  Temp: (!) 96.7 F (35.9 C)  SpO2: 98%   Wt Readings from Last 3 Encounters:  06/01/21 180 lb 3.2 oz (81.7 kg)  05/26/21 182 lb 3.2 oz (82.6 kg)  05/20/21 175 lb (79.4 kg)   Physical Exam Vitals reviewed.  Constitutional:      Appearance: Normal appearance.     Comments: In wheelchair  Cardiovascular:     Rate and Rhythm: Normal rate and regular rhythm.     Pulses: Normal pulses.     Heart sounds: Normal heart sounds.  Pulmonary:     Effort: Pulmonary effort is normal.     Breath sounds: Normal breath sounds.  Neurological:     General: No focal deficit present.     Mental Status: She is alert and oriented to person, place, and time.  Psychiatric:        Mood and Affect: Mood normal.        Behavior: Behavior normal.    LABORATORY DATA:  I have reviewed the labs as listed.  CBC Latest Ref Rng & Units 05/26/2021 05/20/2021 05/14/2021  WBC 3.4 - 10.8 x10E3/uL 8.7 8.4 10.0  Hemoglobin 11.1 - 15.9 g/dL 8.0(L) 7.8(L) 8.2(L)  Hematocrit 34.0 - 46.6 % 25.1(L) 24.4(L)  26.0(L)  Platelets 150 - 450 x10E3/uL 241 221 227   CMP Latest Ref Rng & Units 05/26/2021 10/27/2019 08/29/2019  Glucose 70 - 99 mg/dL 138(H) 227(H) 351(H)  BUN 6 - 20 mg/dL 33(H) 34(H) 47(H)  Creatinine 0.57 - 1.00 mg/dL 2.32(H) 0.92 0.98  Sodium 134 - 144 mmol/L 141 130(L) 134(L)  Potassium 3.5 - 5.2 mmol/L 5.4(H) 4.0 5.5(H)  Chloride 96 - 106 mmol/L 113(H) 92(L) 104  CO2  20 - 29 mmol/L 15(L) 27 22  °Calcium 8.7 - 10.2 mg/dL 8.0(L) 8.9 9.8  °Total Protein 6.0 - 8.5 g/dL 5.4(L) - -  °Total Bilirubin 0.0 - 1.2 mg/dL <0.2 - -  °Alkaline Phos 44 - 121 IU/L 217(H) - -  °AST 0 - 40 IU/L 31 - -  °ALT 0 - 32 IU/L 24 - -  ° °   °Component Value Date/Time  ° RBC 2.85 (L) 05/26/2021 1148  ° RBC 2.72 (L) 05/20/2021 0657  ° MCV 88 05/26/2021 1148  ° MCH 28.1 05/26/2021 1148  ° MCH 28.7 05/20/2021 0657  ° MCHC 31.9 05/26/2021 1148  ° MCHC 32.0 05/20/2021 0657  ° RDW 14.4 05/26/2021 1148  ° LYMPHSABS 1.8 05/14/2021 1040  ° MONOABS 0.3 05/14/2021 1040  ° EOSABS 0.4 05/14/2021 1040  ° BASOSABS 0.1 05/14/2021 1040  ° ° °DIAGNOSTIC IMAGING:  °I have independently reviewed the scans and discussed with the patient. °US BIOPSY (KIDNEY) ° °Result Date: 05/20/2021 °INDICATION: Nephrotic range proteinuria. Please perform ultrasound-guided renal biopsy for tissue diagnostic purposes. EXAM: ULTRASOUND GUIDED RENAL BIOPSY COMPARISON:  Renal ultrasound-06/21/2019; CT abdomen and pelvis-06/07/2019 MEDICATIONS: None. ANESTHESIA/SEDATION: Moderate (conscious) sedation was employed during this procedure as administered by the Interventional Radiology RN. A total of Versed 2 mg and Fentanyl 100 mcg was administered intravenously. Moderate Sedation Time: 14 minutes. The patient's level of consciousness and vital signs were monitored continuously by radiology nursing throughout the procedure under my direct supervision. COMPLICATIONS: SIR Level A - No therapy, no consequence. Procedure complicated by development of a tiny asymptomatic  left-sided perinephric hematoma. PROCEDURE: Informed written consent was obtained from the patient after a discussion of the risks, benefits and alternatives to treatment. The patient understands and consents the procedure. A timeout was performed prior to the initiation of the procedure. Ultrasound scanning was performed of the bilateral flanks. The inferior pole of the left kidney was selected for biopsy due to location and sonographic window. The procedure was planned. The operative site was prepped and draped in the usual sterile fashion. The overlying soft tissues were anesthetized with 1% lidocaine with epinephrine. A 17 gauge core needle biopsy device was advanced into the inferior cortex of the left kidney and 2 core biopsies were obtained under direct ultrasound guidance. Images were saved for documentation purposes. The biopsy device was removed and hemostasis was obtained with manual compression. Post procedural scanning demonstrated development of a tiny asymptomatic left-sided perinephric hematoma. A dressing was applied. The patient tolerated the procedure well without immediate post procedural complication. IMPRESSION: Technically successful ultrasound guided left renal biopsy. Procedure complicated by development of a tiny asymptomatic left-sided perinephric hematoma. Electronically Signed   By: John  Watts M.D.   On: 05/20/2021 10:00    ° °ASSESSMENT:  °Normocytic anemia: °- Patient evaluated at the request of Dr. Coladonato for work-up of normocytic anemia. °- Labs from 01/08/2021 showed hemoglobin 7.9 with MCV 85.  White count and platelet count was normal.  Normal differential.  Ferritin was 155 and percent saturation was 25. °- She has chronic kidney disease with creatinine around 1.5. °- She denies any bleeding per rectum or melena.  She had a history of blood transfusion in 2021.  Denies ice pica.  No B symptoms. °- EGD on 06/27/2019 with normal esophagus, status post Maloney dilation, focal  abnormality in the gastric mucosa likely result of trauma (heaving).  Normal duodenal bulb and second part of duodenum. ° °2.  Social/family history: °- She lives at home   with her mother.  She used to work at [employer] but is on disability at this time.  She has no exposure to chemicals.  She is non-smoker. °- She uses a cane to ambulate since April last year. °- No family history of sickle cell or thalassemia.  Father had pancreatic cancer. ° ° °PLAN:  °Normocytic anemia: °- We reviewed results of her blood work.  Ferritin is 74 and percent saturation is 28.  MMA is elevated at 419.  Copper and folic acid were normal. °- M spike was negative.  Free light chain ratio is elevated at 2.11. °- Recommend parenteral iron therapy with Feraheme weekly x2.  If there is no improvement after adequate repletion of iron stores, will consider erythropoiesis stimulating agents. °- We will give B12 injection today in the office as a loading dose.  She was told to take B12 1 mg tablet daily. °- RTC 6 weeks with repeat labs. ° °2.  CKD stage IIIb: °- Thought to be secondary to poorly controlled diabetes and acute kidney injury due to vancomycin toxicity. 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 °- Continue follow-up with Dr. Coladonato. ° °Orders placed this encounter:  °No orders of the defined types were placed in this encounter. ° ° ° °Sreedhar Katragadda, MD °[facility] °[phone] ° ° °I, Kirstyn Evans, am acting as a scribe for Dr. Sreedhar Katragadda. ° °I, Sreedhar Katragadda MD, have reviewed the above documentation for accuracy and completeness, and I agree with the above. °  ° ° °

## 2021-06-01 NOTE — Progress Notes (Signed)
Orders placed for labs and Vitamin B12 injection per Dr. Tomie China order.

## 2021-06-02 ENCOUNTER — Encounter: Payer: Self-pay | Admitting: "Endocrinology

## 2021-06-02 ENCOUNTER — Ambulatory Visit (INDEPENDENT_AMBULATORY_CARE_PROVIDER_SITE_OTHER): Payer: 59 | Admitting: "Endocrinology

## 2021-06-02 VITALS — BP 130/86 | HR 80 | Ht 67.0 in | Wt 177.0 lb

## 2021-06-02 DIAGNOSIS — Z91199 Patient's noncompliance with other medical treatment and regimen due to unspecified reason: Secondary | ICD-10-CM | POA: Diagnosis not present

## 2021-06-02 DIAGNOSIS — E1065 Type 1 diabetes mellitus with hyperglycemia: Secondary | ICD-10-CM | POA: Diagnosis not present

## 2021-06-02 DIAGNOSIS — D509 Iron deficiency anemia, unspecified: Secondary | ICD-10-CM | POA: Insufficient documentation

## 2021-06-02 LAB — HGB A1C W/O EAG: Hgb A1c MFr Bld: 7.5 % — ABNORMAL HIGH (ref 4.8–5.6)

## 2021-06-02 LAB — SPECIMEN STATUS REPORT

## 2021-06-02 MED ORDER — NOVOLOG FLEXPEN 100 UNIT/ML ~~LOC~~ SOPN: PEN_INJECTOR | SUBCUTANEOUS | 0 refills | Status: DC

## 2021-06-02 MED ORDER — FREESTYLE LIBRE 2 SENSOR MISC: 1.0000 | 3 refills | Status: DC

## 2021-06-02 MED ORDER — FREESTYLE LIBRE 2 READER DEVI: 0 refills | Status: DC

## 2021-06-02 NOTE — Progress Notes (Signed)
Endocrinology Consult Note       06/02/2021, 4:54 PM   Subjective:    Patient ID: Lindsey Murillo, female    DOB: March 09, 1996.  Kenitra R Dupree is being seen in consultation for management of currently uncontrolled symptomatic diabetes requested by  Coral Spikes, DO.   Past Medical History:  Diagnosis Date   Acanthosis nigricans, acquired    Asthma    Diabetic autonomic neuropathy ([name])    Diabetic peripheral neuropathy ([name])    Environmental allergies    Goiter    Hypoglycemia associated with diabetes ([name])    Tachycardia    Thyroiditis, autoimmune    Type 1 diabetes mellitus in patient age 52-19 years with HbA1C goal below 7.5     Past Surgical History:  Procedure Laterality Date   BIOPSY  08/27/2016   Procedure: BIOPSY;  Surgeon: Danie Binder, MD;  Location: [facility];  Service: Endoscopy;;  duodenum; gastric   BIOPSY  06/27/2019   Procedure: BIOPSY;  Surgeon: Daneil Dolin, MD;  Location: [facility];  Service: Endoscopy;;   COLONOSCOPY     ESOPHAGOGASTRODUODENOSCOPY N/A 08/27/2016   Dr. Oneida Alar: mild gastritis. Negative celiac. No obvious source for dyspepsia/diarrhea   ESOPHAGOGASTRODUODENOSCOPY (EGD) WITH PROPOFOL N/A 06/27/2019   rourk: Focal abnormality of the gastric mucosa likely due to trauma (heaving).  Biopsy showed mild gastritis, negative for H. pylori.  Esophageal dilation for history of dysphagia but normal-appearing esophagus.    Social History   Socioeconomic History   Marital status: Single    Spouse name: Not on file   Number of children: Not on file   Years of education: Not on file   Highest education level: Not on file  Occupational History   Not on file  Tobacco Use   Smoking status: Never   Smokeless tobacco: Never  Vaping Use   Vaping Use: Never used  Substance and Sexual Activity   Alcohol use: No   Drug use: No   Sexual activity: Never     Birth control/protection: Abstinence  Other Topics Concern   Not on file  Social History Narrative   Not on file   Social Determinants of Health   Financial Resource Strain: Not on file  Food Insecurity: Not on file  Transportation Needs: Not on file  Physical Activity: Not on file  Stress: Not on file  Social Connections: Not on file    Family History  Problem Relation Age of Onset   Diabetes Mother        Type II DM   Thyroid disease Mother    Diabetes Maternal Grandmother        Type II DM   Diabetes Cousin        Type II DM   Colon cancer Neg Hx    Colon polyps Neg Hx     Outpatient Encounter Medications as of 06/02/2021  Medication Sig   acetaminophen (TYLENOL) 325 MG tablet Take 650 mg by mouth every 6 (six) hours as needed (for pain).   Continuous Blood Gluc Receiver (FREESTYLE LIBRE 2 READER) DEVI As directed   Continuous Blood Gluc  Sensor (FREESTYLE LIBRE 2 SENSOR) MISC 1 Piece by Does not apply route every 14 (fourteen) days.   metoprolol tartrate (LOPRESSOR) 50 MG tablet Take 50 mg by mouth 2 (two) times daily.   vitamin B-12 (CYANOCOBALAMIN) 1000 MCG tablet Take 1,000 mcg by mouth daily.   blood glucose meter kit and supplies KIT Dispense based on patient and insurance preference. Use up to four times daily as directed.   Cholecalciferol (VITAMIN D3) 125 MCG (5000 UT) TABS Take 5,000 Units by mouth once a week. (Patient not taking: Reported on 06/02/2021)   furosemide (LASIX) 40 MG tablet Take 80 mg by mouth 2 (two) times daily.   ibuprofen (ADVIL) 800 MG tablet Take 800 mg by mouth every 6 (six) hours as needed. (Patient not taking: Reported on 06/01/2021)   insulin aspart (NOVOLOG FLEXPEN) 100 UNIT/ML FlexPen Do not use until next visit in 10 days.   insulin glargine-yfgn (SEMGLEE) 100 UNIT/ML injection 2 (two) times daily.   Insulin Pen Needle 31G X 4 MM MISC Use as directed to administer insulin.   lisinopril (ZESTRIL) 20 MG tablet Take 20 mg by mouth daily.    rosuvastatin (CRESTOR) 10 MG tablet Take 1 tablet (10 mg total) by mouth daily.   [DISCONTINUED] insulin aspart (NOVOLOG FLEXPEN) 100 UNIT/ML FlexPen Inject 8-11 Units into the skin 3 (three) times daily with meals. (Patient not taking: Reported on 06/02/2021)   [DISCONTINUED] insulin glargine (LANTUS) 100 UNIT/ML injection Inject 0.1 mLs (10 Units total) into the skin daily. (Patient not taking: Reported on 06/02/2021)   No facility-administered encounter medications on file as of 06/02/2021.    ALLERGIES: No Known Allergies  VACCINATION STATUS: Immunization History  Administered Date(s) Administered   Influenza,inj,Quad PF,6+ Mos 03/13/2013, 02/25/2014, 04/09/2016, 01/26/2017, 01/18/2019   Td 04/15/2017    Diabetes She has type 1 diabetes mellitus. Her disease course has been improving. Associated symptoms include polydipsia and polyuria. Diabetic complications include nephropathy and peripheral neuropathy. (Non adherence to medical treatment.) Risk factors for coronary artery disease include diabetes mellitus, dyslipidemia and sedentary lifestyle. Her weight is fluctuating minimally. She is following a generally unhealthy diet. She never participates in exercise. Her home blood glucose trend is decreasing steadily. Her breakfast blood glucose range is generally 110-130 mg/dl. Her overall blood glucose range is 110-130 mg/dl. (She was previously seen in this clinic for diabetes management , she never returned for a follow up for almost 3 years. She brings in a meter showing EAG of 126 for the last 7 days.) An ACE inhibitor/angiotensin II receptor blocker is being taken.  Hyperlipidemia This is a chronic problem. Exacerbating diseases include diabetes. Current antihyperlipidemic treatment includes statins. Risk factors for coronary artery disease include dyslipidemia, diabetes mellitus, hypertension and a sedentary lifestyle.  Hypertension This is a chronic problem. Risk factors for coronary  artery disease include dyslipidemia and diabetes mellitus. Past treatments include ACE inhibitors.    Review of Systems  Endocrine: Positive for polydipsia and polyuria.   Objective:    Vitals with BMI 06/02/2021 06/01/2021 05/26/2021  Height 5' 7"  - -  Weight 177 lbs 180 lbs 3 oz 182 lbs 3 oz  BMI 27.72 85.46 27.03  Systolic 500 938 182  Diastolic 86 99 993  Pulse 80 94 107    BP 130/86    Pulse 80    Ht 5' 7"  (1.702 m)    Wt 177 lb (80.3 kg)    BMI 27.72 kg/m   Wt Readings from Last 3 Encounters:  06/02/21  177 lb (80.3 kg)  06/01/21 180 lb 3.2 oz (81.7 kg)  05/26/21 182 lb 3.2 oz (82.6 kg)     Physical Exam    CMP ( most recent) CMP     Component Value Date/Time   NA 141 05/26/2021 1148   K 5.4 (H) 05/26/2021 1148   CL 113 (H) 05/26/2021 1148   CO2 15 (L) 05/26/2021 1148   GLUCOSE 138 (H) 05/26/2021 1148   GLUCOSE 227 (H) 10/27/2019 2021   BUN 33 (H) 05/26/2021 1148   CREATININE 2.32 (H) 05/26/2021 1148   CREATININE 0.98 08/29/2019 1637   CALCIUM 8.0 (L) 05/26/2021 1148   PROT 5.4 (L) 05/26/2021 1148   ALBUMIN 2.3 (L) 05/26/2021 1148   AST 31 05/26/2021 1148   ALT 24 05/26/2021 1148   ALKPHOS 217 (H) 05/26/2021 1148   BILITOT <0.2 05/26/2021 1148   GFRNONAA >60 10/27/2019 2021   GFRAA >60 10/27/2019 2021     Diabetic Labs (most recent): Lab Results  Component Value Date   HGBA1C 7.5 (H) 05/26/2021   HGBA1C 9.3 (H) 05/09/2019   HGBA1C >15.5 12/13/2018     Lipid Panel ( most recent) Lipid Panel     Component Value Date/Time   CHOL 214 (H) 05/26/2021 1148   TRIG 146 05/26/2021 1148   HDL 80 05/26/2021 1148   CHOLHDL 2.7 05/26/2021 1148   CHOLHDL 2.0 11/05/2015 1252   VLDL 12 11/05/2015 1252   LDLCALC 109 (H) 05/26/2021 1148   LABVLDL 25 05/26/2021 1148      Lab Results  Component Value Date   TSH 2.520 05/26/2021   TSH 1.802 05/14/2021   TSH 0.342 (L) 05/27/2018   TSH 0.55 11/05/2015   TSH 1.175 11/28/2012   TSH 0.706 07/20/2012   TSH  1.008 08/26/2011   FREET4 1.4 11/05/2015   FREET4 1.26 11/28/2012   FREET4 1.44 07/20/2012   FREET4 1.23 08/26/2011           Assessment & Plan:   1. Uncontrolled type 1 diabetes mellitus with hyperglycemia and CKD  - Bridgid R Tully has currently uncontrolled symptomatic type (?1) DM since  26 years of age,  with most recent A1c of  7.5%.  this is a significantly better a1c than bofore. She was previously seen in the clinic in 2017 and 2020 , failed to return for a f/u. She has a documented hx of chronic non-adherence to medical  f/u. She was admin. Classified as type 1 for management purposes.  - I had a long discussion with her about the progressive nature of diabetes and the pathology behind its complications. -her diabetes is complicated by peripheral neuropathy, CKD, peripheral ? Lymphedema she remains at a high risk for more acute and chronic complications which include CAD, CVA, CKD, retinopathy, and neuropathy. These are all discussed in detail with her.  - I discussed all available options of managing her diabetes including de-escalation of medications. I have counseled her on diet  and weight management  by adopting a Whole Food , Plant Predominant  ( WFPP) nutrition as recommended by [employer] of [employer]estyle [employer]. Patient is encouraged to switch to  unprocessed or minimally processed  complex starch, adequate protein intake (mainly plant source), minimal liquid fat ( mainly vegetable oils), plenty of fruits, and vegetables. -  she is advised to stick to a routine mealtimes to eat 3 complete meals a day and snack only when necessary ( to snack only to correct hypoglycemia BG <70 day time or <  100 at night).   - she acknowledges that there is a room for improvement in her food and drink choices. - Further Specific Suggestion is made for her to avoid simple carbohydrates  from her diet including Cakes, Sweet Desserts, Ice Cream, Soda (diet and regular), Sweet Tea,  Candies, Chips, Cookies, Store Bought Juices, Alcohol ,  Artificial Sweeteners,  Coffee Creamer, and "Sugar-free" Products. This will help patient to have more stable blood glucose profile and potentially avoid unintended weight gain.    - I have approached her with the following plan to manage  her diabetes and patient agrees:   Since she has not taken her Novolog for the last several weeks, I advised her to hold off Novolog until her next visit in 10 days. -In the mean time, she is advised to readjust her Semglee to 14 units qhs, associated with , associated with strict monitoring of glucose 4 times a day-before meals and at bedtime. - she is warned not to take insulin without proper monitoring per orders. - Adjustment parameters are given to her for hypo and hyperglycemia in writing. she is encouraged to call clinic for blood glucose levels less than 70 or above 300 mg /dl. She does not have a suitable , non insulin option to treat her diabetes.  - Specific targets for  A1c;  LDL, HDL,  and Triglycerides were discussed with the patient.  2) Blood Pressure /Hypertension:  her blood pressure is uncontrolled  to target.   she is advised to continue her current medications including  Lisinopril 26m p.o. daily with breakfast . 3) Lipids/Hyperlipidemia:   Review of her recent lipid panel showed uncontrolled  LDL at 109.  she  is advised to continue    Crestor 10 mg daily at bedtime.  Side effects and precautions discussed with her.  4)  Weight/Diet:  Body mass index is 27.72 kg/m.   I discussed with her the fact that loss of 5 - 10% of her  current body weight will have the most impact on her diabetes management.  The above detailed  ACLM recommendations for nutrition, exercise, sleep, social life, avoidance of risky substances, the need for restorative sleep   information will also detailed on discharge instructions.  5) Chronic Care/Health Maintenance:  -she  is on ACEI/ARB and Statin  medications and  is encouraged to initiate and continue to follow up with Ophthalmology, Dentist,  Podiatrist at least yearly or according to recommendations, and advised to  stay away from smoking. I have recommended yearly flu vaccine and pneumonia vaccine at least every 5 years; moderate intensity exercise for up to 150 minutes weekly; and  sleep for 7- 9 hours a day.  - she is  advised to maintain close follow up with CCoral Spikes DO for primary care needs, as well as her other providers for optimal and coordinated care.   I spent 50 minutes in the care of the patient today including review of labs from [facility], Thyroid Function, Hematology (current and previous including abstractions from other facilities); face-to-face time discussing  her blood glucose readings/logs, discussing hypoglycemia and hyperglycemia episodes and symptoms, medications doses, her options of short and long term treatment based on the latest standards of care / guidelines;  discussion about incorporating lifestyle medicine;  and documenting the encounter.     Please refer to Patient Instructions for Blood Glucose Monitoring and Insulin/Medications Dosing Guide"  in media tab for additional information. Please  also refer to " Patient  Self Inventory" in the Media  tab for reviewed elements of pertinent patient history.  [employer] participated in the discussions, expressed understanding, and voiced agreement with the above plans.  All questions were answered to her satisfaction. she is encouraged to contact clinic should she have any questions or concerns prior to her return visit.   Follow up plan: - Return in about 10 days (around 06/12/2021) for F/U with Meter and Logs Only - no Labs.  Glade Lloyd, MD [facility] [address] [place], [place] [address] Phone: [phone]  Fax: [phone]    06/02/2021, 4:54 PM  This note was partially dictated  with voice recognition software. Similar sounding words can be transcribed inadequately or may not  be corrected upon review.

## 2021-06-02 NOTE — Patient Instructions (Signed)
° ° °                               Additional Care Considerations for Diabetes   -Diabetes  is a chronic disease.  The most important care consideration is regular follow-up with your diabetes care provider with the goal being avoiding or delaying its complications and to take advantage of advances in medications and technology.  If appropriate actions are taken early enough, type 2 diabetes can even be reversed.  Seek information from the right source.  - Whole Food, Plant Predominant Nutrition is highly recommended: Eat Plenty of vegetables, Mushrooms, fruits, Legumes, Whole Grains, Nuts, seeds in lieu of processed meats, processed snacks/pastries red meat, poultry, eggs as recommended by [employer] of  [employer]estyle Medicine (ACLM).  -Type 2 diabetes is known to coexist with other important comorbidities such as high blood pressure and high cholesterol.  It is critical to control not only the diabetes but also the high blood pressure and high cholesterol to minimize and delay the risk of complications including coronary artery disease, stroke, amputations, blindness, etc.  The good news is that this diet recommendation for type 2 diabetes is also very helpful for managing high cholesterol and high blood blood pressure.  - Studies showed that people with diabetes will benefit from a class of medications known as ACE inhibitors and statins.  Unless there are specific reasons not to be on these medications, the standard of care is to consider getting one from these groups of medications at an optimal doses.  These medications are generally considered safe and proven to help protect the heart and the kidneys.    - People with diabetes are encouraged to initiate and maintain regular follow-up with eye doctors, foot doctors, dentists , and if necessary heart and kidney doctors.     - It is highly recommended that people with diabetes quit smoking or stay away from  smoking, and get yearly  flu vaccine and pneumonia vaccine at least every 5 years.  See above for additional recommendations on exercise, sleep, stress management , and healthy social connections.

## 2021-06-08 ENCOUNTER — Other Ambulatory Visit: Payer: Self-pay

## 2021-06-08 ENCOUNTER — Inpatient Hospital Stay (HOSPITAL_COMMUNITY): Payer: 59

## 2021-06-08 ENCOUNTER — Encounter (HOSPITAL_COMMUNITY): Payer: Self-pay

## 2021-06-08 VITALS — BP 169/110 | HR 97 | Temp 97.9°F | Resp 18 | Ht 67.0 in

## 2021-06-08 DIAGNOSIS — D509 Iron deficiency anemia, unspecified: Secondary | ICD-10-CM

## 2021-06-08 DIAGNOSIS — I10 Essential (primary) hypertension: Secondary | ICD-10-CM

## 2021-06-08 DIAGNOSIS — D649 Anemia, unspecified: Secondary | ICD-10-CM | POA: Diagnosis not present

## 2021-06-08 MED ORDER — LORATADINE 10 MG PO TABS
10.0000 mg | ORAL_TABLET | Freq: Once | ORAL | Status: AC
  Administered 2021-06-08: 10 mg via ORAL
  Filled 2021-06-08: qty 1

## 2021-06-08 MED ORDER — SODIUM CHLORIDE 0.9 % IV SOLN: Freq: Once | INTRAVENOUS | Status: AC

## 2021-06-08 MED ORDER — ACETAMINOPHEN 325 MG PO TABS
650.0000 mg | ORAL_TABLET | Freq: Once | ORAL | Status: AC
  Administered 2021-06-08: 650 mg via ORAL
  Filled 2021-06-08: qty 2

## 2021-06-08 MED ORDER — SODIUM CHLORIDE 0.9 % IV SOLN
510.0000 mg | Freq: Once | INTRAVENOUS | Status: AC
  Administered 2021-06-08: 510 mg via INTRAVENOUS
  Filled 2021-06-08: qty 510

## 2021-06-08 MED ORDER — CLONIDINE HCL 0.1 MG PO TABS
0.2000 mg | ORAL_TABLET | Freq: Once | ORAL | Status: AC
  Administered 2021-06-08: 0.2 mg via ORAL
  Filled 2021-06-08: qty 2

## 2021-06-08 NOTE — Progress Notes (Signed)
Patient presents today for iron. BP 182/105 Dr. Delton Coombes made aware, received verbal orders for 0.2 of Clonidine. Patient okay to proceed with treatment per Dr. Delton Coombes. Pharmacy aware. IV started and pre-meds given. Patient tolerated iron infusion with no complaints voiced. Peripheral IV site clean and dry with good blood return noted before and after infusion. Band aid applied. VSS with discharge, BP elevated, patient denies chest pain, SOB or headache and left in satisfactory condition with no s/s of distress noted.

## 2021-06-09 ENCOUNTER — Encounter: Payer: Self-pay | Admitting: Gastroenterology

## 2021-06-09 ENCOUNTER — Ambulatory Visit (INDEPENDENT_AMBULATORY_CARE_PROVIDER_SITE_OTHER): Payer: 59 | Admitting: Gastroenterology

## 2021-06-09 VITALS — BP 135/92 | HR 102 | Temp 97.3°F | Ht 67.0 in | Wt 168.4 lb

## 2021-06-09 DIAGNOSIS — R1032 Left lower quadrant pain: Secondary | ICD-10-CM | POA: Insufficient documentation

## 2021-06-09 DIAGNOSIS — R109 Unspecified abdominal pain: Secondary | ICD-10-CM | POA: Insufficient documentation

## 2021-06-09 DIAGNOSIS — D509 Iron deficiency anemia, unspecified: Secondary | ICD-10-CM

## 2021-06-09 NOTE — Progress Notes (Signed)
Primary Care Physician: Coral Spikes, DO  Primary Gastroenterologist:    Chief Complaint  Patient presents with   Anemia    Iron infusion yesterday    Diarrhea    Comes/goes   Abdominal Pain    Left side comes/goes    HPI: Lindsey Murillo is a 26 y.o. female here with complicated PMH including type 1 DM, diabetic autonomic/peripheral neuropathy, chronic pancreatitis with EPI, stable pancreatic pseudocyst,CKD with nephrotic range proteinuria (recent renal bx c/w diabetic glomerulosclerosis), h/o sacral osteomyelitis, anemia presenting for follow up. Anemia first noted in 04/2019 with Hgb of 6.3 during hospitalization at which time she had folate deficiency, low iron and sats, heme positive stool. She had normal ferritin in setting of osteomyelitis. She also had acute kidney injury suspected to be due to vanc. She completed EGD 06/2019 and had mild gastritis, biopsies negative for celiac.  Patient last seen 04/2020. At that time we tried to call and schedule colonoscopy for anemia, heme + stool, brbpr but she did not return call. Also wanted her to restart her Creon and Pantoprazole. She called 05/13/21 to make an appointment for abdominal pain.   Presents today with her mother.  Patient complains of left-sided abdominal pain which can be significant, sharp, brings tears to her eyes.  Pain does not seem to be associated with meals.  Occurring for months.  Not always associated with bowel movements.  Sometimes worse with urination.  Often puts a pillow over her abdomen to apply pressure but nothing really seems to make it any better except for this.  She has had pain off and on all day.  She has had vomiting at times.  Often wakes up nauseated in the morning, feels so bad she tries to force herself to vomit but able to do this.  Denies any heartburn.  She has 1-4 stools per day.  Occasionally formed.  Denies any nocturnal stools.  Denies any melena or rectal bleeding.  No hematuria or  menstrual losses.  Receiving iron infusions currently.  Nausea with intermittent vomiting.    She has a lot of lower extremity edema.  Very difficult for her to walk, sit, manipulate her legs.  To get on the exam table she has to use her arms to lift her legs.  She states that her nephrologist is aware of her edema but nothing seems to be helpful.  Legs are very tender.  Currently taking frusemide 80 mg twice daily.  Her weight fluctuates quite a bit.  Listed at 106 pounds in June 2021.  December 2021 she weighed 142 pounds.  October 202,  180 pounds.  May 14, 2021 she weighed 170 pounds.  May 26, 2021 she weighed 182 pounds.  Today she weighs 168.4 pounds. ?error with weights.     Current Outpatient Medications  Medication Sig Dispense Refill   acetaminophen (TYLENOL) 325 MG tablet Take 650 mg by mouth every 6 (six) hours as needed (for pain).     blood glucose meter kit and supplies KIT Dispense based on patient and insurance preference. Use up to four times daily as directed. 1 each 0   Continuous Blood Gluc Receiver (FREESTYLE LIBRE 2 READER) DEVI As directed 1 each 0   Continuous Blood Gluc Sensor (FREESTYLE LIBRE 2 SENSOR) MISC 1 Piece by Does not apply route every 14 (fourteen) days. 2 each 3   furosemide (LASIX) 40 MG tablet Take 80 mg by mouth 2 (two) times daily.  insulin aspart (NOVOLOG FLEXPEN) 100 UNIT/ML FlexPen Do not use until next visit in 10 days. 15 mL 0   insulin glargine-yfgn (SEMGLEE) 100 UNIT/ML injection 2 (two) times daily.     Insulin Pen Needle 31G X 4 MM MISC Use as directed to administer insulin. 100 each 1   lisinopril (ZESTRIL) 20 MG tablet Take 20 mg by mouth daily.     metoprolol tartrate (LOPRESSOR) 50 MG tablet Take 50 mg by mouth 2 (two) times daily.     rosuvastatin (CRESTOR) 10 MG tablet Take 1 tablet (10 mg total) by mouth daily. 90 tablet 3   vitamin B-12 (CYANOCOBALAMIN) 1000 MCG tablet Take 1,000 mcg by mouth daily.     No current  facility-administered medications for this visit.    Allergies as of 06/09/2021   (No Known Allergies)   Past Medical History:  Diagnosis Date   Acanthosis nigricans, acquired    Asthma    Diabetic autonomic neuropathy ([name])    Diabetic peripheral neuropathy ([name])    Environmental allergies    Goiter    Hypoglycemia associated with diabetes ([place])    Tachycardia    Thyroiditis, autoimmune    Type 1 diabetes mellitus in patient age 31-19 years with HbA1C goal below 7.5    Past Surgical History:  Procedure Laterality Date   BIOPSY  08/27/2016   Procedure: BIOPSY;  Surgeon: Danie Binder, MD;  Location: [facility];  Service: Endoscopy;;  duodenum; gastric   BIOPSY  06/27/2019   Procedure: BIOPSY;  Surgeon: Daneil Dolin, MD;  Location: [facility];  Service: Endoscopy;;   COLONOSCOPY     ESOPHAGOGASTRODUODENOSCOPY N/A 08/27/2016   Dr. Oneida Alar: mild gastritis. Negative celiac. No obvious source for dyspepsia/diarrhea   ESOPHAGOGASTRODUODENOSCOPY (EGD) WITH PROPOFOL N/A 06/27/2019   rourk: Focal abnormality of the gastric mucosa likely due to trauma (heaving).  Biopsy showed mild gastritis, negative for H. pylori.  Esophageal dilation for history of dysphagia but normal-appearing esophagus.   Family History  Problem Relation Age of Onset   Diabetes Mother        Type II DM   Thyroid disease Mother    Diabetes Maternal Grandmother        Type II DM   Diabetes Cousin        Type II DM   Colon cancer Neg Hx    Colon polyps Neg Hx    Social History   Tobacco Use   Smoking status: Never   Smokeless tobacco: Never  Vaping Use   Vaping Use: Never used  Substance Use Topics   Alcohol use: No   Drug use: No     ROS:  General: Negative for anorexia, weight loss, fever, chills, fatigue, weakness. ENT: Negative for hoarseness, difficulty swallowing , nasal congestion. CV: Negative for chest pain, angina, palpitations, dyspnea on exertion, ++++peripheral edema.   Respiratory: Negative for dyspnea at rest, dyspnea on exertion, cough, sputum, wheezing.  GI: See history of present illness. GU:  Negative for dysuria, hematuria, urinary incontinence, urinary frequency, nocturnal urination.  Endo: Negative for unusual weight change.    Physical Examination:   BP (!) 135/92    Pulse (!) 102    Temp (!) 97.3 F (36.3 C)    Ht 5' 7" (1.702 m)    Wt 168 lb 6.4 oz (76.4 kg)    BMI 26.38 kg/m   General: Well-nourished, well-developed in no acute distress. Accompanied by mother. Patient difficult historian. Standing during visit stating she  cannot sit due to leg swelling. Very difficult for her to get on exam table.  Eyes: No icterus. Mouth: masked Lungs: Clear to auscultation bilaterally.  Heart: Regular rate and rhythm, no murmurs rubs or gallops.  Abdomen: Bowel sounds are normal,   nondistended, no hepatosplenomegaly or masses, no abdominal bruits or hernia , no rebound or guarding.  Minimal tenderness on exam today. Extremities: massive swelling in the lower extremities to the knees. No clubbing or deformities. Neuro: Alert and oriented x 4   Skin: Warm and dry, no jaundice.   Psych: Alert and cooperative, normal mood and affect.  Labs:  Lab Results  Component Value Date   HGBA1C 7.5 (H) 05/26/2021   Lab Results  Component Value Date   CREATININE 2.32 (H) 05/26/2021   BUN 33 (H) 05/26/2021   NA 141 05/26/2021   K 5.4 (H) 05/26/2021   CL 113 (H) 05/26/2021   CO2 15 (L) 05/26/2021   Lab Results  Component Value Date   ALT 24 05/26/2021   AST 31 05/26/2021   ALKPHOS 217 (H) 05/26/2021   BILITOT <0.2 05/26/2021   Lab Results  Component Value Date   WBC 8.7 05/26/2021   HGB 8.0 (L) 05/26/2021   HCT 25.1 (L) 05/26/2021   MCV 88 05/26/2021   PLT 241 05/26/2021   Lab Results  Component Value Date   IRON 77 05/14/2021   TIBC 279 05/14/2021   FERRITIN 74 05/14/2021     Lab Results  Component Value Date   VITAMINB12 1,057 (H)  05/14/2021   Lab Results  Component Value Date   FOLATE 11.7 05/14/2021    Imaging Studies: US BIOPSY (KIDNEY)  Result Date: 05/20/2021 INDICATION: Nephrotic range proteinuria. Please perform ultrasound-guided renal biopsy for tissue diagnostic purposes. EXAM: ULTRASOUND GUIDED RENAL BIOPSY COMPARISON:  Renal ultrasound-06/21/2019; CT abdomen and pelvis-06/07/2019 MEDICATIONS: None. ANESTHESIA/SEDATION: Moderate (conscious) sedation was employed during this procedure as administered by the Interventional Radiology RN. A total of Versed 2 mg and Fentanyl 100 mcg was administered intravenously. Moderate Sedation Time: 14 minutes. The patient's level of consciousness and vital signs were monitored continuously by radiology nursing throughout the procedure under my direct supervision. COMPLICATIONS: SIR Level A - No therapy, no consequence. Procedure complicated by development of a tiny asymptomatic left-sided perinephric hematoma. PROCEDURE: Informed written consent was obtained from the patient after a discussion of the risks, benefits and alternatives to treatment. The patient understands and consents the procedure. A timeout was performed prior to the initiation of the procedure. Ultrasound scanning was performed of the bilateral flanks. The inferior pole of the left kidney was selected for biopsy due to location and sonographic window. The procedure was planned. The operative site was prepped and draped in the usual sterile fashion. The overlying soft tissues were anesthetized with 1% lidocaine with epinephrine. A 17 gauge core needle biopsy device was advanced into the inferior cortex of the left kidney and 2 core biopsies were obtained under direct ultrasound guidance. Images were saved for documentation purposes. The biopsy device was removed and hemostasis was obtained with manual compression. Post procedural scanning demonstrated development of a tiny asymptomatic left-sided perinephric hematoma. A  dressing was applied. The patient tolerated the procedure well without immediate post procedural complication. IMPRESSION: Technically successful ultrasound guided left renal biopsy. Procedure complicated by development of a tiny asymptomatic left-sided perinephric hematoma. Electronically Signed   By: Sandi Mariscal M.D.   On: 05/20/2021 10:00     Assessment: Left sided abdominal pain: for  several months. Intermittent in nature. Abdominal exam ok today. She is not having constipation. Etiology not clear at this time. May require imaging (noncontrast given kidney disease as advised by nephrology unless absolutely necessary) in near future but would consider evaluation of IDA initially.   N/V: intermittent. Patient poor historian. With ongoing symptoms and IDA, would consider possible EGD at time of colonoscopy.  IDA: EGD completed 06/2019. Colonoscopy recommended but given her kidney issues, pending biopsy, LE edema, would discuss with nephrology before scheduling to confirm safest bowel prep for her.   LE edema: significant edema noted on exam. She is on lasix 57m bid and no results. Encouraged her to call her nephrologist and update them on her edema.    Plan: EGD/colonoscopy with Dr. CAbbey Chatters ASA 3. Will schedule after discussing bowel prep with nephrology given renal insufficiency. If EGD/colonoscopy unrevealing, she will need CT to evaluate her abdominal pain.

## 2021-06-09 NOTE — Patient Instructions (Signed)
We will be in touch once discuss bowel prep with nephrology. We will work towards scheduling a colonoscopy and upper endoscopy at that time.

## 2021-06-12 ENCOUNTER — Inpatient Hospital Stay (HOSPITAL_COMMUNITY)
Admission: EM | Admit: 2021-06-12 | Discharge: 2021-06-23 | DRG: 683 | Disposition: A | Discharge status: Home / Self Care | Payer: 59 | Attending: Internal Medicine | Admitting: Internal Medicine

## 2021-06-12 ENCOUNTER — Other Ambulatory Visit: Payer: Self-pay

## 2021-06-12 ENCOUNTER — Encounter (HOSPITAL_COMMUNITY): Payer: Self-pay

## 2021-06-12 DIAGNOSIS — K219 Gastro-esophageal reflux disease without esophagitis: Secondary | ICD-10-CM | POA: Diagnosis present

## 2021-06-12 DIAGNOSIS — R6881 Early satiety: Secondary | ICD-10-CM | POA: Diagnosis present

## 2021-06-12 DIAGNOSIS — D638 Anemia in other chronic diseases classified elsewhere: Secondary | ICD-10-CM | POA: Diagnosis present

## 2021-06-12 DIAGNOSIS — E785 Hyperlipidemia, unspecified: Secondary | ICD-10-CM | POA: Diagnosis present

## 2021-06-12 DIAGNOSIS — I10 Essential (primary) hypertension: Secondary | ICD-10-CM | POA: Diagnosis present

## 2021-06-12 DIAGNOSIS — E063 Autoimmune thyroiditis: Secondary | ICD-10-CM | POA: Diagnosis present

## 2021-06-12 DIAGNOSIS — J45909 Unspecified asthma, uncomplicated: Secondary | ICD-10-CM | POA: Diagnosis present

## 2021-06-12 DIAGNOSIS — E10649 Type 1 diabetes mellitus with hypoglycemia without coma: Secondary | ICD-10-CM | POA: Diagnosis not present

## 2021-06-12 DIAGNOSIS — E872 Acidosis, unspecified: Secondary | ICD-10-CM | POA: Diagnosis not present

## 2021-06-12 DIAGNOSIS — E1022 Type 1 diabetes mellitus with diabetic chronic kidney disease: Secondary | ICD-10-CM | POA: Diagnosis present

## 2021-06-12 DIAGNOSIS — E1042 Type 1 diabetes mellitus with diabetic polyneuropathy: Secondary | ICD-10-CM | POA: Diagnosis present

## 2021-06-12 DIAGNOSIS — Z79899 Other long term (current) drug therapy: Secondary | ICD-10-CM

## 2021-06-12 DIAGNOSIS — L280 Lichen simplex chronicus: Secondary | ICD-10-CM | POA: Diagnosis present

## 2021-06-12 DIAGNOSIS — I129 Hypertensive chronic kidney disease with stage 1 through stage 4 chronic kidney disease, or unspecified chronic kidney disease: Secondary | ICD-10-CM | POA: Diagnosis present

## 2021-06-12 DIAGNOSIS — Z23 Encounter for immunization: Secondary | ICD-10-CM | POA: Diagnosis not present

## 2021-06-12 DIAGNOSIS — D631 Anemia in chronic kidney disease: Secondary | ICD-10-CM | POA: Diagnosis present

## 2021-06-12 DIAGNOSIS — Z794 Long term (current) use of insulin: Secondary | ICD-10-CM

## 2021-06-12 DIAGNOSIS — E8809 Other disorders of plasma-protein metabolism, not elsewhere classified: Secondary | ICD-10-CM | POA: Diagnosis present

## 2021-06-12 DIAGNOSIS — E876 Hypokalemia: Secondary | ICD-10-CM | POA: Diagnosis not present

## 2021-06-12 DIAGNOSIS — E1043 Type 1 diabetes mellitus with diabetic autonomic (poly)neuropathy: Secondary | ICD-10-CM | POA: Diagnosis present

## 2021-06-12 DIAGNOSIS — E8779 Other fluid overload: Secondary | ICD-10-CM | POA: Diagnosis present

## 2021-06-12 DIAGNOSIS — E1065 Type 1 diabetes mellitus with hyperglycemia: Secondary | ICD-10-CM | POA: Diagnosis present

## 2021-06-12 DIAGNOSIS — N179 Acute kidney failure, unspecified: Principal | ICD-10-CM | POA: Diagnosis present

## 2021-06-12 DIAGNOSIS — I872 Venous insufficiency (chronic) (peripheral): Secondary | ICD-10-CM | POA: Diagnosis present

## 2021-06-12 DIAGNOSIS — N184 Chronic kidney disease, stage 4 (severe): Secondary | ICD-10-CM | POA: Diagnosis present

## 2021-06-12 DIAGNOSIS — Z20822 Contact with and (suspected) exposure to covid-19: Secondary | ICD-10-CM | POA: Diagnosis present

## 2021-06-12 DIAGNOSIS — Z8349 Family history of other endocrine, nutritional and metabolic diseases: Secondary | ICD-10-CM

## 2021-06-12 DIAGNOSIS — R609 Edema, unspecified: Secondary | ICD-10-CM

## 2021-06-12 DIAGNOSIS — R197 Diarrhea, unspecified: Secondary | ICD-10-CM | POA: Diagnosis present

## 2021-06-12 DIAGNOSIS — G8929 Other chronic pain: Secondary | ICD-10-CM | POA: Diagnosis present

## 2021-06-12 DIAGNOSIS — N1831 Chronic kidney disease, stage 3a: Secondary | ICD-10-CM | POA: Diagnosis not present

## 2021-06-12 DIAGNOSIS — R600 Localized edema: Secondary | ICD-10-CM | POA: Diagnosis not present

## 2021-06-12 DIAGNOSIS — Z833 Family history of diabetes mellitus: Secondary | ICD-10-CM

## 2021-06-12 LAB — CBC WITH DIFFERENTIAL/PLATELET
Abs Immature Granulocytes: 0.03 10*3/uL (ref 0.00–0.07)
Basophils Absolute: 0.1 10*3/uL (ref 0.0–0.1)
Basophils Relative: 1 %
Eosinophils Absolute: 0.6 10*3/uL — ABNORMAL HIGH (ref 0.0–0.5)
Eosinophils Relative: 5 %
HCT: 25.8 % — ABNORMAL LOW (ref 36.0–46.0)
Hemoglobin: 8.1 g/dL — ABNORMAL LOW (ref 12.0–15.0)
Immature Granulocytes: 0 %
Lymphocytes Relative: 21 %
Lymphs Abs: 2.4 10*3/uL (ref 0.7–4.0)
MCH: 28.9 pg (ref 26.0–34.0)
MCHC: 31.4 g/dL (ref 30.0–36.0)
MCV: 92.1 fL (ref 80.0–100.0)
Monocytes Absolute: 0.6 10*3/uL (ref 0.1–1.0)
Monocytes Relative: 5 %
Neutro Abs: 7.8 10*3/uL — ABNORMAL HIGH (ref 1.7–7.7)
Neutrophils Relative %: 68 %
Platelets: 236 10*3/uL (ref 150–400)
RBC: 2.8 MIL/uL — ABNORMAL LOW (ref 3.87–5.11)
RDW: 14.2 % (ref 11.5–15.5)
WBC: 11.5 10*3/uL — ABNORMAL HIGH (ref 4.0–10.5)
nRBC: 0 % (ref 0.0–0.2)

## 2021-06-12 LAB — COMPREHENSIVE METABOLIC PANEL
ALT: 30 U/L (ref 0–44)
AST: 28 U/L (ref 15–41)
Albumin: 1.9 g/dL — ABNORMAL LOW (ref 3.5–5.0)
Alkaline Phosphatase: 179 U/L — ABNORMAL HIGH (ref 38–126)
Anion gap: 11 (ref 5–15)
BUN: 46 mg/dL — ABNORMAL HIGH (ref 6–20)
CO2: 13 mmol/L — ABNORMAL LOW (ref 22–32)
Calcium: 8.3 mg/dL — ABNORMAL LOW (ref 8.9–10.3)
Chloride: 113 mmol/L — ABNORMAL HIGH (ref 98–111)
Creatinine, Ser: 2.89 mg/dL — ABNORMAL HIGH (ref 0.44–1.00)
GFR, Estimated: 22 mL/min — ABNORMAL LOW (ref >60)
Glucose, Bld: 129 mg/dL — ABNORMAL HIGH (ref 70–99)
Potassium: 3.8 mmol/L (ref 3.5–5.1)
Sodium: 137 mmol/L (ref 135–145)
Total Bilirubin: 0.2 mg/dL — ABNORMAL LOW (ref 0.3–1.2)
Total Protein: 6 g/dL — ABNORMAL LOW (ref 6.5–8.1)

## 2021-06-12 LAB — RESP PANEL BY RT-PCR (FLU A&B, COVID) ARPGX2
Influenza A by PCR: NEGATIVE
Influenza B by PCR: NEGATIVE
SARS Coronavirus 2 by RT PCR: NEGATIVE

## 2021-06-12 LAB — BRAIN NATRIURETIC PEPTIDE: B Natriuretic Peptide: 239 pg/mL — ABNORMAL HIGH (ref 0.0–100.0)

## 2021-06-12 MED ORDER — ENSURE ENLIVE PO LIQD
237.0000 mL | Freq: Two times a day (BID) | ORAL | Status: DC
  Administered 2021-06-13 – 2021-06-23 (×14): 237 mL via ORAL

## 2021-06-12 MED ORDER — ALBUMIN HUMAN 25 % IV SOLN
12.5000 g | Freq: Four times a day (QID) | INTRAVENOUS | Status: AC
  Administered 2021-06-13 (×3): 12.5 g via INTRAVENOUS
  Filled 2021-06-12 (×6): qty 50

## 2021-06-12 MED ORDER — FUROSEMIDE 10 MG/ML IJ SOLN
60.0000 mg | Freq: Once | INTRAMUSCULAR | Status: AC
  Administered 2021-06-12: 60 mg via INTRAVENOUS
  Filled 2021-06-12: qty 6

## 2021-06-12 NOTE — ED Provider Notes (Signed)
Emergency Department Provider Note   I have reviewed the triage vital signs and the nursing notes.   HISTORY  Chief Complaint Leg Swelling   HPI Lindsey Murillo is a 26 y.o. female with complicated past medical history including uncontrolled type 1 diabetes, chronic kidney disease, hypertension presents to the emergency department for evaluation of severe swelling in the legs now with weeping, clear drainage.  Mom is at bedside and states that she has been compliant with her home medications but has had increasing swelling in the legs despite remaining compliant with her Lasix 80 mg twice daily dosing.  She is not having shortness of breath or chest discomfort.  No fever.  Mom reports a foul odor to the legs.  They called the nephrologist today who advised ED evaluation and labs.    Past Medical History:  Diagnosis Date   Acanthosis nigricans, acquired    Asthma    Diabetic autonomic neuropathy ([place])    Diabetic peripheral neuropathy ([name])    Environmental allergies    Goiter    Hypoglycemia associated with diabetes ([place])    Tachycardia    Thyroiditis, autoimmune    Type 1 diabetes mellitus in patient age 79-19 years with HbA1C goal below 7.5     Review of Systems  Constitutional: No fever/chills Eyes: No visual changes. ENT: No sore throat. Cardiovascular: Denies chest pain. Positive bilateral leg swelling.  Respiratory: Denies shortness of breath. Gastrointestinal: No abdominal pain.  No nausea, no vomiting.  No diarrhea.  No constipation. Genitourinary: Negative for dysuria. Musculoskeletal: Negative for back pain.  Skin: Negative for rash. Neurological: Negative for headaches, focal weakness or numbness.   ____________________________________________   PHYSICAL EXAM:  VITAL SIGNS: ED Triage Vitals  Enc Vitals Group     BP 06/12/21 1912 (!) 161/105     Pulse Rate 06/12/21 1912 100     Resp 06/12/21 1912 16     Temp 06/12/21 1912 98.7 F (37.1 C)      Temp Source 06/12/21 1912 Oral     SpO2 06/12/21 1912 100 %   Constitutional: Alert and oriented. Well appearing and in no acute distress. Eyes: Conjunctivae are normal.  Head: Atraumatic. Nose: No congestion/rhinnorhea. Mouth/Throat: Mucous membranes are moist.  Neck: No stridor.   Cardiovascular: Normal rate, regular rhythm. Good peripheral circulation. Grossly normal heart sounds.   Respiratory: Normal respiratory effort.  No retractions. Lungs CTAB. Gastrointestinal: No distention.  Musculoskeletal: Bilateral lower extremity swelling which is pitting.  There is weeping drainage from both legs along with lichenification of skin and venous stasis dermatitis bilaterally.  Neurologic:  Normal speech and language.  Skin:  Skin is warm, dry and intact. No rash noted.  ____________________________________________   LABS (all labs ordered are listed, but only abnormal results are displayed)  Labs Reviewed  COMPREHENSIVE METABOLIC PANEL - Abnormal; Notable for the following components:      Result Value   Chloride 113 (*)    CO2 13 (*)    Glucose, Bld 129 (*)    BUN 46 (*)    Creatinine, Ser 2.89 (*)    Calcium 8.3 (*)    Total Protein 6.0 (*)    Albumin 1.9 (*)    Alkaline Phosphatase 179 (*)    Total Bilirubin 0.2 (*)    GFR, Estimated 22 (*)    All other components within normal limits  CBC WITH DIFFERENTIAL/PLATELET - Abnormal; Notable for the following components:   WBC 11.5 (*)    RBC  2.80 (*)    Hemoglobin 8.1 (*)    HCT 25.8 (*)    Neutro Abs 7.8 (*)    Eosinophils Absolute 0.6 (*)    All other components within normal limits  BRAIN NATRIURETIC PEPTIDE - Abnormal; Notable for the following components:   B Natriuretic Peptide 239.0 (*)    All other components within normal limits  BLOOD GAS, VENOUS - Abnormal; Notable for the following components:   pCO2, Ven 38.8 (*)    pO2, Ven <31.0 (*)    Bicarbonate 16.4 (*)    Acid-base deficit 8.9 (*)    All other  components within normal limits  URINALYSIS, ROUTINE W REFLEX MICROSCOPIC - Abnormal; Notable for the following components:   Glucose, UA 100 (*)    Hgb urine dipstick SMALL (*)    Protein, ur 100 (*)    All other components within normal limits  COMPREHENSIVE METABOLIC PANEL - Abnormal; Notable for the following components:   Chloride 114 (*)    CO2 19 (*)    Glucose, Bld 149 (*)    BUN 48 (*)    Creatinine, Ser 2.90 (*)    Calcium 8.2 (*)    Total Protein 4.9 (*)    Albumin 1.6 (*)    Alkaline Phosphatase 138 (*)    Total Bilirubin 0.2 (*)    GFR, Estimated 22 (*)    All other components within normal limits  CBC WITH DIFFERENTIAL/PLATELET - Abnormal; Notable for the following components:   RBC 2.32 (*)    Hemoglobin 6.4 (*)    HCT 20.6 (*)    All other components within normal limits  GLUCOSE, CAPILLARY - Abnormal; Notable for the following components:   Glucose-Capillary 137 (*)    All other components within normal limits  GLUCOSE, CAPILLARY - Abnormal; Notable for the following components:   Glucose-Capillary 132 (*)    All other components within normal limits  GLUCOSE, CAPILLARY - Abnormal; Notable for the following components:   Glucose-Capillary 151 (*)    All other components within normal limits  GLUCOSE, CAPILLARY - Abnormal; Notable for the following components:   Glucose-Capillary 113 (*)    All other components within normal limits  CBC - Abnormal; Notable for the following components:   RBC 2.86 (*)    Hemoglobin 8.6 (*)    HCT 25.6 (*)    All other components within normal limits  URINALYSIS, MICROSCOPIC (REFLEX) - Abnormal; Notable for the following components:   Bacteria, UA FEW (*)    All other components within normal limits  GLUCOSE, CAPILLARY - Abnormal; Notable for the following components:   Glucose-Capillary 102 (*)    All other components within normal limits  RENAL FUNCTION PANEL - Abnormal; Notable for the following components:   Chloride  112 (*)    CO2 18 (*)    BUN 48 (*)    Creatinine, Ser 2.81 (*)    Calcium 8.1 (*)    Phosphorus 6.3 (*)    Albumin 1.9 (*)    GFR, Estimated 23 (*)    All other components within normal limits  RESP PANEL BY RT-PCR (FLU A&B, COVID) ARPGX2  HIV ANTIBODY (ROUTINE TESTING W REFLEX)  MAGNESIUM  GLUCOSE, CAPILLARY  MICROALBUMIN / CREATININE URINE RATIO  TYPE AND SCREEN  PREPARE RBC (CROSSMATCH)    ____________________________________________   PROCEDURES  Procedure(s) performed:   Procedures  None ____________________________________________   INITIAL IMPRESSION / ASSESSMENT AND PLAN / ED COURSE  Pertinent labs & imaging  results that were available during my care of the patient were reviewed by me and considered in my medical decision making (see chart for details).   This patient is Presenting for Evaluation of peripheral edema, which does require a range of treatment options, and is a complaint that involves a high risk of morbidity and mortality.  The Differential Diagnoses include new CHF, AKI, peripheral edema, limb ischemic change, developing cellulitis.  Critical Interventions- IV lasix   Medications  feeding supplement (ENSURE ENLIVE / ENSURE PLUS) liquid 237 mL (237 mLs Oral Given 06/14/21 0815)  metoprolol tartrate (LOPRESSOR) tablet 50 mg (50 mg Oral Given 06/14/21 0814)  rosuvastatin (CRESTOR) tablet 10 mg (10 mg Oral Given 06/14/21 0814)  vitamin B-12 (CYANOCOBALAMIN) tablet 1,000 mcg (1,000 mcg Oral Given 06/14/21 0814)  insulin glargine-yfgn (SEMGLEE) injection 8 Units (8 Units Subcutaneous Not Given 06/13/21 2213)  insulin aspart (novoLOG) injection 0-15 Units (0 Units Subcutaneous Not Given 06/14/21 0819)  insulin aspart (novoLOG) injection 0-5 Units (0 Units Subcutaneous Not Given 06/13/21 2213)  heparin injection 5,000 Units (5,000 Units Subcutaneous Given 06/14/21 0600)  furosemide (LASIX) injection 80 mg (80 mg Intravenous Given 06/14/21 0814)  acetaminophen  (TYLENOL) tablet 650 mg (650 mg Oral Given 06/13/21 2205)    Or  acetaminophen (TYLENOL) suppository 650 mg ( Rectal See Alternative 06/13/21 2205)  ondansetron (ZOFRAN) tablet 4 mg ( Oral See Alternative 06/13/21 2205)    Or  ondansetron (ZOFRAN) injection 4 mg (4 mg Intravenous Given 06/13/21 2205)  melatonin tablet 6 mg (6 mg Oral Given 06/13/21 2205)  influenza vac split quadrivalent PF (FLUARIX) injection 0.5 mL (has no administration in time range)  oxyCODONE (Oxy IR/ROXICODONE) immediate release tablet 5 mg (has no administration in time range)  furosemide (LASIX) injection 60 mg (60 mg Intravenous Given 06/12/21 2244)  albumin human 25 % solution 12.5 g (0 g Intravenous Stopped 06/13/21 2205)  0.9 %  sodium chloride infusion (Manually program via Guardrails IV Fluids) (0 mLs Intravenous Stopped 06/13/21 1917)    Reassessment after intervention:  Patient beginning to diurese here.    I did obtain Additional Historical Information from Mom at bedside who describes med compliance and timeline of symptoms.  I decided to review pertinent External Data, and in summary no prior ECHO for review.   Clinical Laboratory Tests Ordered, included creatinine slightly worse than baseline at 2.89. BNP mildly elevated. Mild leukocytosis.   Radiologic Tests: Considered CXR but patient without hypoxemia, increased WOB, or SOB symptoms. Defer for now.   Cardiac Monitor Tracing which shows NSR.   Social Determinants of Health Risk patient is a non-smoker.   Consult complete with Hospitalist for admit  Medical Decision Making: Summary:  Patient with bilateral LE edema. Legs weeping and soaking bedding here. No obvious cellulitis at this time but elevated risk. Normal pulses. Creatinine slightly elevated compared to baseline. Plan for admit for diuresis and consideration of ECHO.   Reevaluation with update and discussion with patient and Mom at bedside who are in agreement.   Disposition:  admit  ____________________________________________  FINAL CLINICAL IMPRESSION(S) / ED DIAGNOSES  Final diagnoses:  Peripheral edema    Note:  This document was prepared using Dragon voice recognition software and may include unintentional dictation errors.  Nanda Quinton, MD, [facility]    Tandy Grawe, [facility], MD 06/14/21 [id number]

## 2021-06-12 NOTE — ED Triage Notes (Signed)
Pt reports her leg being "moist."

## 2021-06-13 ENCOUNTER — Inpatient Hospital Stay (HOSPITAL_COMMUNITY): Payer: 59

## 2021-06-13 ENCOUNTER — Encounter (HOSPITAL_COMMUNITY): Payer: Self-pay | Admitting: Family Medicine

## 2021-06-13 DIAGNOSIS — R600 Localized edema: Secondary | ICD-10-CM | POA: Insufficient documentation

## 2021-06-13 DIAGNOSIS — I10 Essential (primary) hypertension: Secondary | ICD-10-CM

## 2021-06-13 DIAGNOSIS — E8809 Other disorders of plasma-protein metabolism, not elsewhere classified: Secondary | ICD-10-CM

## 2021-06-13 DIAGNOSIS — E1065 Type 1 diabetes mellitus with hyperglycemia: Secondary | ICD-10-CM

## 2021-06-13 DIAGNOSIS — R609 Edema, unspecified: Secondary | ICD-10-CM

## 2021-06-13 DIAGNOSIS — N179 Acute kidney failure, unspecified: Principal | ICD-10-CM

## 2021-06-13 LAB — BLOOD GAS, VENOUS
Acid-base deficit: 8.9 mmol/L — ABNORMAL HIGH (ref 0.0–2.0)
Bicarbonate: 16.4 mmol/L — ABNORMAL LOW (ref 20.0–28.0)
Drawn by: 1517
FIO2: 21
O2 Saturation: 41.8 %
Patient temperature: 37
pCO2, Ven: 38.8 mmHg — ABNORMAL LOW (ref 44.0–60.0)
pH, Ven: 7.26 (ref 7.250–7.430)
pO2, Ven: <31 mmHg — CL (ref 32.0–45.0)

## 2021-06-13 LAB — COMPREHENSIVE METABOLIC PANEL
ALT: 22 U/L (ref 0–44)
AST: 19 U/L (ref 15–41)
Albumin: 1.6 g/dL — ABNORMAL LOW (ref 3.5–5.0)
Alkaline Phosphatase: 138 U/L — ABNORMAL HIGH (ref 38–126)
Anion gap: 7 (ref 5–15)
BUN: 48 mg/dL — ABNORMAL HIGH (ref 6–20)
CO2: 19 mmol/L — ABNORMAL LOW (ref 22–32)
Calcium: 8.2 mg/dL — ABNORMAL LOW (ref 8.9–10.3)
Chloride: 114 mmol/L — ABNORMAL HIGH (ref 98–111)
Creatinine, Ser: 2.9 mg/dL — ABNORMAL HIGH (ref 0.44–1.00)
GFR, Estimated: 22 mL/min — ABNORMAL LOW (ref >60)
Glucose, Bld: 149 mg/dL — ABNORMAL HIGH (ref 70–99)
Potassium: 3.9 mmol/L (ref 3.5–5.1)
Sodium: 140 mmol/L (ref 135–145)
Total Bilirubin: 0.2 mg/dL — ABNORMAL LOW (ref 0.3–1.2)
Total Protein: 4.9 g/dL — ABNORMAL LOW (ref 6.5–8.1)

## 2021-06-13 LAB — ECHOCARDIOGRAM COMPLETE
AR max vel: 1.96 cm2
AV Area VTI: 1.83 cm2
AV Area mean vel: 1.98 cm2
AV Mean grad: 4 mmHg
AV Peak grad: 7.8 mmHg
Ao pk vel: 1.4 m/s
Area-P 1/2: 4.36 cm2
Calc EF: 48.4 %
Height: 67 in
MV VTI: 2.18 cm2
S' Lateral: 3.3 cm
Single Plane A2C EF: 41.8 %
Single Plane A4C EF: 50.3 %
Weight: 2636.8 oz

## 2021-06-13 LAB — URINALYSIS, MICROSCOPIC (REFLEX)

## 2021-06-13 LAB — URINALYSIS, ROUTINE W REFLEX MICROSCOPIC
Bilirubin Urine: NEGATIVE
Glucose, UA: 100 mg/dL — AB
Ketones, ur: NEGATIVE mg/dL
Leukocytes,Ua: NEGATIVE
Nitrite: NEGATIVE
Protein, ur: 100 mg/dL — AB
Specific Gravity, Urine: 1.025 (ref 1.005–1.030)
pH: 5.5 (ref 5.0–8.0)

## 2021-06-13 LAB — GLUCOSE, CAPILLARY
Glucose-Capillary: 137 mg/dL — ABNORMAL HIGH (ref 70–99)
Glucose-Capillary: 132 mg/dL — ABNORMAL HIGH (ref 70–99)
Glucose-Capillary: 151 mg/dL — ABNORMAL HIGH (ref 70–99)
Glucose-Capillary: 113 mg/dL — ABNORMAL HIGH (ref 70–99)
Glucose-Capillary: 102 mg/dL — ABNORMAL HIGH (ref 70–99)

## 2021-06-13 LAB — CBC WITH DIFFERENTIAL/PLATELET
Abs Immature Granulocytes: 0.03 10*3/uL (ref 0.00–0.07)
Basophils Absolute: 0.1 10*3/uL (ref 0.0–0.1)
Basophils Relative: 1 %
Eosinophils Absolute: 0.5 10*3/uL (ref 0.0–0.5)
Eosinophils Relative: 6 %
HCT: 20.6 % — ABNORMAL LOW (ref 36.0–46.0)
Hemoglobin: 6.4 g/dL — CL (ref 12.0–15.0)
Immature Granulocytes: 0 %
Lymphocytes Relative: 18 %
Lymphs Abs: 1.6 10*3/uL (ref 0.7–4.0)
MCH: 27.6 pg (ref 26.0–34.0)
MCHC: 31.1 g/dL (ref 30.0–36.0)
MCV: 88.8 fL (ref 80.0–100.0)
Monocytes Absolute: 0.5 10*3/uL (ref 0.1–1.0)
Monocytes Relative: 6 %
Neutro Abs: 6.6 10*3/uL (ref 1.7–7.7)
Neutrophils Relative %: 69 %
Platelets: 199 10*3/uL (ref 150–400)
RBC: 2.32 MIL/uL — ABNORMAL LOW (ref 3.87–5.11)
RDW: 14.2 % (ref 11.5–15.5)
WBC: 9.3 10*3/uL (ref 4.0–10.5)
nRBC: 0 % (ref 0.0–0.2)

## 2021-06-13 LAB — PREPARE RBC (CROSSMATCH)

## 2021-06-13 LAB — MAGNESIUM: Magnesium: 1.8 mg/dL (ref 1.7–2.4)

## 2021-06-13 LAB — HIV ANTIBODY (ROUTINE TESTING W REFLEX): HIV Screen 4th Generation wRfx: NONREACTIVE

## 2021-06-13 MED ORDER — INFLUENZA VAC SPLIT QUAD 0.5 ML IM SUSY
0.5000 mL | PREFILLED_SYRINGE | INTRAMUSCULAR | Status: AC
Start: 2021-06-14 — End: 2021-06-15
  Administered 2021-06-15: 0.5 mL via INTRAMUSCULAR
  Filled 2021-06-13: qty 0.5

## 2021-06-13 MED ORDER — FUROSEMIDE 10 MG/ML IJ SOLN
80.0000 mg | Freq: Two times a day (BID) | INTRAMUSCULAR | Status: DC
  Administered 2021-06-13 – 2021-06-15 (×5): 80 mg via INTRAVENOUS
  Filled 2021-06-13 (×5): qty 8

## 2021-06-13 MED ORDER — INSULIN ASPART 100 UNIT/ML IJ SOLN: 0.0000 [IU] | Freq: Every day | INTRAMUSCULAR | Status: DC

## 2021-06-13 MED ORDER — ONDANSETRON HCL 4 MG PO TABS: 4.0000 mg | ORAL_TABLET | Freq: Four times a day (QID) | ORAL | Status: DC | PRN

## 2021-06-13 MED ORDER — HEPARIN SODIUM (PORCINE) 5000 UNIT/ML IJ SOLN
5000.0000 [IU] | Freq: Three times a day (TID) | INTRAMUSCULAR | Status: DC
  Administered 2021-06-13 – 2021-06-23 (×32): 5000 [IU] via SUBCUTANEOUS
  Filled 2021-06-13 (×31): qty 1

## 2021-06-13 MED ORDER — OXYCODONE HCL 5 MG PO TABS: 5.0000 mg | ORAL_TABLET | ORAL | Status: DC | PRN

## 2021-06-13 MED ORDER — MELATONIN 3 MG PO TABS
6.0000 mg | ORAL_TABLET | Freq: Every day | ORAL | Status: DC
  Administered 2021-06-13 – 2021-06-22 (×11): 6 mg via ORAL
  Filled 2021-06-13 (×11): qty 2

## 2021-06-13 MED ORDER — SODIUM CHLORIDE 0.9% IV SOLUTION: Freq: Once | INTRAVENOUS | Status: AC

## 2021-06-13 MED ORDER — LISINOPRIL 10 MG PO TABS: 20.0000 mg | ORAL_TABLET | Freq: Every day | ORAL | Status: DC

## 2021-06-13 MED ORDER — VITAMIN B-12 1000 MCG PO TABS
1000.0000 ug | ORAL_TABLET | Freq: Every day | ORAL | Status: DC
  Administered 2021-06-13 – 2021-06-23 (×11): 1000 ug via ORAL
  Filled 2021-06-13 (×11): qty 1

## 2021-06-13 MED ORDER — ACETAMINOPHEN 650 MG RE SUPP: 650.0000 mg | Freq: Four times a day (QID) | RECTAL | Status: DC | PRN

## 2021-06-13 MED ORDER — ROSUVASTATIN CALCIUM 20 MG PO TABS
10.0000 mg | ORAL_TABLET | Freq: Every day | ORAL | Status: DC
  Administered 2021-06-13 – 2021-06-23 (×11): 10 mg via ORAL
  Filled 2021-06-13 (×11): qty 1

## 2021-06-13 MED ORDER — OXYCODONE HCL 5 MG PO TABS
5.0000 mg | ORAL_TABLET | Freq: Four times a day (QID) | ORAL | Status: DC | PRN
  Administered 2021-06-14 – 2021-06-16 (×2): 5 mg via ORAL
  Filled 2021-06-13 (×2): qty 1

## 2021-06-13 MED ORDER — INSULIN GLARGINE-YFGN 100 UNIT/ML ~~LOC~~ SOLN
8.0000 [IU] | Freq: Every day | SUBCUTANEOUS | Status: DC
  Administered 2021-06-15 – 2021-06-17 (×3): 8 [IU] via SUBCUTANEOUS
  Filled 2021-06-13 (×6): qty 0.08

## 2021-06-13 MED ORDER — INSULIN ASPART 100 UNIT/ML IJ SOLN
0.0000 [IU] | Freq: Three times a day (TID) | INTRAMUSCULAR | Status: DC
  Administered 2021-06-13: 3 [IU] via SUBCUTANEOUS
  Administered 2021-06-13 – 2021-06-16 (×4): 2 [IU] via SUBCUTANEOUS

## 2021-06-13 MED ORDER — METOPROLOL TARTRATE 50 MG PO TABS
50.0000 mg | ORAL_TABLET | Freq: Two times a day (BID) | ORAL | Status: DC
  Administered 2021-06-13 – 2021-06-23 (×22): 50 mg via ORAL
  Filled 2021-06-13 (×22): qty 1

## 2021-06-13 MED ORDER — ONDANSETRON HCL 4 MG/2ML IJ SOLN
4.0000 mg | Freq: Four times a day (QID) | INTRAMUSCULAR | Status: DC | PRN
  Administered 2021-06-13 – 2021-06-16 (×5): 4 mg via INTRAVENOUS
  Filled 2021-06-13 (×5): qty 2

## 2021-06-13 MED ORDER — MORPHINE SULFATE (PF) 2 MG/ML IV SOLN: 2.0000 mg | INTRAVENOUS | Status: DC | PRN

## 2021-06-13 MED ORDER — ACETAMINOPHEN 325 MG PO TABS
650.0000 mg | ORAL_TABLET | Freq: Four times a day (QID) | ORAL | Status: DC | PRN
  Administered 2021-06-13 – 2021-06-20 (×5): 650 mg via ORAL
  Filled 2021-06-13 (×4): qty 2

## 2021-06-13 NOTE — H&P (Addendum)
History and Physical    Patient: Lindsey Murillo XOV:[id number] DOB: 09-27-1995 DOA: 06/12/2021 DOS: the patient was seen and examined on 06/13/2021 PCP: Coral Spikes, DO  Patient coming from: Home  Chief Complaint:  Chief Complaint  Patient presents with   Leg Swelling    HPI: Lindsey Murillo is a 26 y.o. female with medical history significant of type 1 diabetes, autoimmune thyroiditis, diabetic neuropathy, hypertension, asthma, and more presents ED with a chief complaint of BL leg weeping.  Patient reports it started about a week ago.  Is been getting worse since it started.  She notices blisters, come up, expand, and then burst.  She has no associated pain.  She reports that her legs are very heavy with fluid as well.  She is becoming more fatigued from trying to ambulate with this fluid overload.  Patient reports she also has dyspnea on exertion for the same reason.  She is not sure if she has orthopnea because she does not attempt to lay flat due to chronic abdominal pain.  Patient denies any chest pain, palpitations.  Patient is following with nephrology.  In February 2021 patient had a vancomycin induced kidney injury, and she has been following with nephrology since that time.  She reports her last visit was last week.  She reports no medications were changed, and her primary complaint there was the leg swelling.  Patient reports the leg swelling has been going on since her hospitalization in February 2021 but has been gradually worsening over an unknown period.  In February 2021 she had negative DVT studies bilaterally.  The conclusion was that patient's third spacing of fluid was secondary to hypoalbuminemia.  She was started on protein shakes.  Mother at bedside reports she has not been using protein shakes.  Patient reports a decreased appetite and early satiety due to chronic abdominal issues.  She reports she has been seen by physician for this problem but has little insight about  what conclusions were made.  Patient reports that she eats too much she immediately has diarrhea.  This has been going on for about a year.  Normal meal for her would be about 2 pieces of pizza.  She reports she eats 3 meals a day.  Patient has no other complaints at this time.  Patient does not smoke, does not drink alcohol, does not use illicit drugs.  Patient is vaccinated for COVID.  Patient is full code.  Review of Systems: As mentioned in the history of present illness. All other systems reviewed and are negative. Past Medical History:  Diagnosis Date   Acanthosis nigricans, acquired    Asthma    Diabetic autonomic neuropathy ([name])    Diabetic peripheral neuropathy ([name])    Environmental allergies    Goiter    Hypoglycemia associated with diabetes ([name])    Tachycardia    Thyroiditis, autoimmune    Type 1 diabetes mellitus in patient age 70-19 years with HbA1C goal below 7.5    Past Surgical History:  Procedure Laterality Date   BIOPSY  08/27/2016   Procedure: BIOPSY;  Surgeon: Danie Binder, MD;  Location: [facility];  Service: Endoscopy;;  duodenum; gastric   BIOPSY  06/27/2019   Procedure: BIOPSY;  Surgeon: Daneil Dolin, MD;  Location: [facility];  Service: Endoscopy;;   COLONOSCOPY     ESOPHAGOGASTRODUODENOSCOPY N/A 08/27/2016   Dr. Oneida Alar: mild gastritis. Negative celiac. No obvious source for dyspepsia/diarrhea   ESOPHAGOGASTRODUODENOSCOPY (EGD) WITH PROPOFOL  N/A 06/27/2019   rourk: Focal abnormality of the gastric mucosa likely due to trauma (heaving).  Biopsy showed mild gastritis, negative for H. pylori.  Esophageal dilation for history of dysphagia but normal-appearing esophagus.   Social History:  reports that she has never smoked. She has never used smokeless tobacco. She reports that she does not drink alcohol and does not use drugs.  No Known Allergies  Family History  Problem Relation Age of Onset   Diabetes Mother        Type II DM   Thyroid disease  Mother    Diabetes Maternal Grandmother        Type II DM   Diabetes Cousin        Type II DM   Colon cancer Neg Hx    Colon polyps Neg Hx     Prior to Admission medications   Medication Sig Start Date End Date Taking? Authorizing Provider  acetaminophen (TYLENOL) 325 MG tablet Take 650 mg by mouth every 6 (six) hours as needed (for pain).    [provider]  blood glucose meter kit and supplies KIT Dispense based on patient and insurance preference. Use up to four times daily as directed. 05/26/21   Coral Spikes, DO  Continuous Blood Gluc Receiver (FREESTYLE LIBRE 2 READER) DEVI As directed 06/02/21   Cassandria Anger, MD  Continuous Blood Gluc Sensor (FREESTYLE LIBRE 2 SENSOR) MISC 1 Piece by Does not apply route every 14 (fourteen) days. 06/02/21   Cassandria Anger, MD  furosemide (LASIX) 40 MG tablet Take 80 mg by mouth 2 (two) times daily. 04/28/20   [provider]  insulin aspart (NOVOLOG FLEXPEN) 100 UNIT/ML FlexPen Do not use until next visit in 10 days. 06/02/21   Cassandria Anger, MD  insulin glargine-yfgn (SEMGLEE) 100 UNIT/ML injection 2 (two) times daily. 05/26/21   [provider]  Insulin Pen Needle 31G X 4 MM MISC Use as directed to administer insulin. 05/26/21   Coral Spikes, DO  lisinopril (ZESTRIL) 20 MG tablet Take 20 mg by mouth daily. 04/28/20   [provider]  metoprolol tartrate (LOPRESSOR) 50 MG tablet Take 50 mg by mouth 2 (two) times daily.    [provider]  rosuvastatin (CRESTOR) 10 MG tablet Take 1 tablet (10 mg total) by mouth daily. 06/01/21   Coral Spikes, DO  vitamin B-12 (CYANOCOBALAMIN) 1000 MCG tablet Take 1,000 mcg by mouth daily.    [provider]    Physical Exam: Vitals:   06/12/21 1912 06/12/21 2035 06/12/21 2200 06/12/21 2230  BP: (!) 161/105 (!) 159/100 (!) 157/107 (!) 164/106  Pulse: 100 100 99 99  Resp: 16 18 18 18   Temp: 98.7 F (37.1 C)     TempSrc: Oral     SpO2:  100% 100% 99% 100%   1.  General: Sitting on edge of bed with legs hanging down, no acute distress   2. Psychiatric: Alert and oriented x 3, mood and behavior normal for situation, pleasant and cooperative with exam   3. Neurologic: Speech and language are normal, face is symmetric, moves all 4 extremities voluntarily, at baseline without acute deficits on limited exam   4. HEENMT:  Head is atraumatic, normocephalic, pupils reactive to light, neck is supple, trachea is midline, mucous membranes are moist   5. Respiratory : Lungs are clear to auscultation bilaterally without wheezing, rhonchi, rales, no cyanosis, no increase in work of breathing or accessory muscle use  6. Cardiovascular : Heart rate normal, rhythm is regular, no murmurs, rubs or gallops, peripheral pulses palpated   7. Gastrointestinal:  Abdomen is soft, nondistended, nontender to palpation bowel sounds active, no masses or organomegaly palpated   8. Skin:  Venous stasis changes in the bilateral lower extremities, fluid-filled blisters, and some previously ruptured blisters, legs are weeping, patient's pajama pants and bed sheets are wet from the weeping.   9.Musculoskeletal:  No acute deformities or trauma, no asymmetry in tone, massive peripheral edema, peripheral pulses palpated, no tenderness to palpation in the extremities   Data Reviewed: In the ED Temp 98.7, heart rate 99-100, respiratory rate 16-18, blood pressure 164/106, satting 99% Borderline leukocytosis 11.5, hemoglobin stable at 8.1  Chemistry panel reveals a decreased bicarb 13, and increased BUN of 46, and increased creatinine 2.89 Alk phos elevated 179 Albumin significantly decreased at 1.9 BNP 239 Respiratory panel pending Patient was given 60 mg of Lasix in the ED Given her AKI and her fluid overload admission was requested for diuresis, and trending creatinine  Assessment and Plan: Peripheral edema Continue SCDs Encourage elevation of  legs Treating with albumin and lasix Continue to monitor  Hypoalbuminemia- (present on admission) Patient has history of nephrotic syndrome but also does not maintain nutrient dense p.o. intake Treatment of albumin overnight Start protein shakes Current albumin 1.9 Likely contributing to her peripheral edema Continue to monitor  AKI (acute kidney injury) ([place])- (present on admission) Last creatinine 2.3, today 2.89 Prior to kidney injury in February 2021 her creatinine was 0.6, baseline since then is unclear and patient has little insight to offer regarding this No recent medication changes With care at overload there is a concern for cardiorenal syndrome -we will get echo Continue to diurese Monitor on telemetry  Uncontrolled type 1 diabetes mellitus with hyperglycemia ([place])- (present on admission) Patient is just started seeing her endocrinologist again She is currently on 10 units of basal insulin at home with no sliding scale coverage Her sugars have been running between 100 -200 We will give reduced basal insulin here with sliding scale coverage Continue to monitor  Essential hypertension, benign- (present on admission) Continue lisinopril, metoprolol Continue to monitor       Advance Care Planning:   Code Status: Prior full  Family Communication: Mother at bedside  Severity of Illness: The appropriate patient status for this patient is INPATIENT. Inpatient status is judged to be reasonable and necessary in order to provide the required intensity of service to ensure the patient's safety. The patient's presenting symptoms, physical exam findings, and initial radiographic and laboratory data in the context of their chronic comorbidities is felt to place them at high risk for further clinical deterioration. Furthermore, it is not anticipated that the patient will be medically stable for discharge from the hospital within 2 midnights of admission.   * I certify that at  the point of admission it is my clinical judgment that the patient will require inpatient hospital care spanning beyond 2 midnights from the point of admission due to high intensity of service, high risk for further deterioration and high frequency of surveillance required.*  Author: Rolla Plate, DO 06/13/2021 12:43 AM  For on call review [email or web address].

## 2021-06-13 NOTE — ED Notes (Signed)
Date and time results received: 06/13/21 0018 (use smartphrase ".now" to insert current time)  Test: VBG pO2 Critical Value: <31  Name of Provider Notified: n/a  Orders Received? Or Actions Taken?:  n/a

## 2021-06-13 NOTE — Assessment & Plan Note (Addendum)
-  Last creatinine around  2.3 -now new baseline 2.8-3.0 -remains fluid overloaded -continue IV lasix 80 mg q 6 hrs>>creatinine remains stable -setting of decreased effective arterial blood volume due to nephrotic syndrome and significant third spacing with ongoing home diuresis/ACE inhibitor management. -appreciate nephrology>>add IV albumin bid>>d/c 2/13 -hold ACEi -serum creatinine holding stable with IV lasix -2/10 standing weight 139 lbs>>137 (2/11)>>137.1 (2/12)>>136 (2/13)>>135 lbs on 06/23/21 -needs strict fluid restriction--question adherence 2/13--change to torsemide 60 mg bid approx NEG 276 lbs for the admission 135 lbs on day of d/c D/c home with torsemide 60 mg bid and KCl 40 daily

## 2021-06-13 NOTE — Progress Notes (Signed)
Patient seen and examined.  Admitted after midnight secondary to swelling and edema; patient with a prior history of type 1 diabetes, hypertension, hyperlipidemia, history of autoimmune thyroiditis and chronic kidney disease stage IIIa (trigger by diabetes and prior ATN with vancomycin exposure in 2021).  Patient reported symptoms have been present for about a week ago and worsening.  Despite her home use of Lasix and noting improvement in the ongoing swelling.  Otherwise hemodynamically stable; ED evaluation demonstrating acute on chronic renal failure with a creatinine that has peaked at 2.9 (most recent creatinine 2.3, 2 weeks ago as an outpatient; prior to that patient creatinine in the 1.2-2.0 range).  Patient also demonstrated mild metabolic acidosis in the setting of acute on chronic renal failure.  Hemoglobin 6.4.  Please refer to H&P written by Dr. Clearence Ped for further info/details on admission.  Plan: -Will transfuse 2 units of PRBCs and follow hemoglobin trend -Currently no signs of apparent bleeding. -Continue treatment with albumin and Lasix as initiated -Will check creatinine/albumin ratio -Avoid/minimize nephrotoxic agents as much as possible -Nephrology service will be consulted and will follow recommendations.  Barton Dubois MD [phone]

## 2021-06-13 NOTE — Plan of Care (Signed)

## 2021-06-13 NOTE — Assessment & Plan Note (Signed)
Continue SCDs Encourage elevation of legs Treating with albumin and lasix Continue to monitor

## 2021-06-13 NOTE — Progress Notes (Incomplete)
*  PRELIMINARY RESULTS* Echocardiogram 2D Echocardiogram has been performed.  Lindsey Murillo 06/13/2021, 8:53 AM

## 2021-06-13 NOTE — Assessment & Plan Note (Addendum)
-  Continue current use of sliding scale insulin and basal insulin while inpatient. -decrease to sliding scale for renal patient due to hypoglycemia -decrease Semglee dose

## 2021-06-13 NOTE — Assessment & Plan Note (Addendum)
-  Patient has history of nephrotic syndrome but also does not maintain nutrient dense p.o. intake -Advised to use Nepro. -Restarted IV albumin bid>>d/c 2/13

## 2021-06-13 NOTE — Assessment & Plan Note (Addendum)
-  d/c ACEi -Continue metoprolol. -slow improvement with diuressis

## 2021-06-14 ENCOUNTER — Encounter: Payer: Self-pay | Admitting: Gastroenterology

## 2021-06-14 DIAGNOSIS — N1831 Chronic kidney disease, stage 3a: Secondary | ICD-10-CM

## 2021-06-14 DIAGNOSIS — K219 Gastro-esophageal reflux disease without esophagitis: Secondary | ICD-10-CM

## 2021-06-14 DIAGNOSIS — D638 Anemia in other chronic diseases classified elsewhere: Secondary | ICD-10-CM | POA: Diagnosis present

## 2021-06-14 LAB — BPAM RBC
Blood Product Expiration Date: 202302162359
Blood Product Expiration Date: 202302272359
ISSUE DATE / TIME: 202302041004
ISSUE DATE / TIME: 202302041352
Unit Type and Rh: 600
Unit Type and Rh: 6200

## 2021-06-14 LAB — TYPE AND SCREEN
ABO/RH(D): A POS
Antibody Screen: NEGATIVE
Unit division: 0
Unit division: 0

## 2021-06-14 LAB — RENAL FUNCTION PANEL
Albumin: 1.9 g/dL — ABNORMAL LOW (ref 3.5–5.0)
Anion gap: 8 (ref 5–15)
BUN: 48 mg/dL — ABNORMAL HIGH (ref 6–20)
CO2: 18 mmol/L — ABNORMAL LOW (ref 22–32)
Calcium: 8.1 mg/dL — ABNORMAL LOW (ref 8.9–10.3)
Chloride: 112 mmol/L — ABNORMAL HIGH (ref 98–111)
Creatinine, Ser: 2.81 mg/dL — ABNORMAL HIGH (ref 0.44–1.00)
GFR, Estimated: 23 mL/min — ABNORMAL LOW (ref >60)
Glucose, Bld: 86 mg/dL (ref 70–99)
Phosphorus: 6.3 mg/dL — ABNORMAL HIGH (ref 2.5–4.6)
Potassium: 3.9 mmol/L (ref 3.5–5.1)
Sodium: 138 mmol/L (ref 135–145)

## 2021-06-14 LAB — CBC
HCT: 25.6 % — ABNORMAL LOW (ref 36.0–46.0)
Hemoglobin: 8.6 g/dL — ABNORMAL LOW (ref 12.0–15.0)
MCH: 30.1 pg (ref 26.0–34.0)
MCHC: 33.6 g/dL (ref 30.0–36.0)
MCV: 89.5 fL (ref 80.0–100.0)
Platelets: 187 10*3/uL (ref 150–400)
RBC: 2.86 MIL/uL — ABNORMAL LOW (ref 3.87–5.11)
RDW: 14.3 % (ref 11.5–15.5)
WBC: 8.4 10*3/uL (ref 4.0–10.5)
nRBC: 0 % (ref 0.0–0.2)

## 2021-06-14 LAB — GLUCOSE, CAPILLARY
Glucose-Capillary: 86 mg/dL (ref 70–99)
Glucose-Capillary: 89 mg/dL (ref 70–99)
Glucose-Capillary: 86 mg/dL (ref 70–99)
Glucose-Capillary: 91 mg/dL (ref 70–99)

## 2021-06-14 MED ORDER — SODIUM CHLORIDE 0.9 % IV SOLN
250.0000 mg | Freq: Every day | INTRAVENOUS | Status: AC
  Administered 2021-06-14 – 2021-06-15 (×2): 250 mg via INTRAVENOUS
  Filled 2021-06-14: qty 5
  Filled 2021-06-14: qty 20

## 2021-06-14 MED ORDER — DARBEPOETIN ALFA 60 MCG/0.3ML IJ SOSY
60.0000 ug | PREFILLED_SYRINGE | INTRAMUSCULAR | Status: DC
  Administered 2021-06-14 – 2021-06-21 (×2): 60 ug via SUBCUTANEOUS
  Filled 2021-06-14 (×2): qty 0.3

## 2021-06-14 NOTE — Progress Notes (Signed)
On Progress Note   Patient: Lindsey Murillo KCM:[id number] DOB: 1995/07/18 DOA: 06/12/2021     2 DOS: the patient was seen and examined on 06/14/2021   Brief admission narrative: As per H&P written by Dr.Zierle-Ghosh on 06/13/21 Loma Sender Roorda is a 26 y.o. female with medical history significant of type 1 diabetes, autoimmune thyroiditis, diabetic neuropathy, hypertension, asthma, and more presents ED with a chief complaint of BL leg weeping.  Patient reports it started about a week ago.  Is been getting worse since it started.  She notices blisters, come up, expand, and then burst.  She has no associated pain.  She reports that her legs are very heavy with fluid as well.  She is becoming more fatigued from trying to ambulate with this fluid overload.  Patient reports she also has dyspnea on exertion for the same reason.  She is not sure if she has orthopnea because she does not attempt to lay flat due to chronic abdominal pain.  Patient denies any chest pain, palpitations.  Patient is following with nephrology.  In February 2021 patient had a vancomycin induced kidney injury, and she has been following with nephrology since that time.  She reports her last visit was last week.  She reports no medications were changed, and her primary complaint there was the leg swelling.  Patient reports the leg swelling has been going on since her hospitalization in February 2021 but has been gradually worsening over an unknown period.  In February 2021 she had negative DVT studies bilaterally.  The conclusion was that patient's third spacing of fluid was secondary to hypoalbuminemia.  She was started on protein shakes.  Mother at bedside reports she has not been using protein shakes.  Patient reports a decreased appetite and early satiety due to chronic abdominal issues.  She reports she has been seen by physician for this problem but has little insight about what conclusions were made.  Patient reports that she eats  too much she immediately has diarrhea.  This has been going on for about a year.  Normal meal for her would be about 2 pieces of pizza.  She reports she eats 3 meals a day.  Patient has no other complaints at this time.  Assessment and Plan: * AKI (acute kidney injury) ([place])- (present on admission) -Acute kidney injury on chronic renal failure (stage IIIa at baseline).  Last creatinine around  2.3 -Avoid nephrotoxic medications, contrast exposure, hypertension and maintain adequate hydration. -Continue treatment with IV Lasix to improve pharmacokinetics/efficacy. -Patient acute failure most likely in the setting of decreased effective arterial blood volume due to nephrotic syndrome and significant third spacing with ongoing home diuresis/ACE inhibitor management. -Will continue to follow nephrology service recommendation. -For now given acute kidney injury her nephrotic syndrome will be managed with sodium restriction and fluid restriction; ACE inhibitor's will be held until renal function further improved.   Anemia of chronic disease- (present on admission) -Hemoglobin down to 6.4 -2 units PRBCs has been transfused -Repeat hemoglobin after transfusion demonstrating a stable level at 8.6. -IV iron and ESA therapy as per nephrology service discretion. -No signs of overt bleeding.  Hypoalbuminemia- (present on admission) -Patient has history of nephrotic syndrome but also does not maintain nutrient dense p.o. intake -Advised to use Nepro. -Received treatment with IV albumin.  GERD (gastroesophageal reflux disease)- (present on admission) -Continue PPI.  Uncontrolled type 1 diabetes mellitus with hyperglycemia ([place])- (present on admission) -Continue current use of sliding scale insulin and basal  insulin while inpatient. -Follow CBGs and further adjust hypoglycemic regimen as required. -Patient will continue outpatient follow-up with endocrinology service.   Essential hypertension,  benign- (present on admission) -Will hold ACE inhibitors in the setting of acute kidney injury -Continue the use of metoprolol. -Continue to follow vital signs. -Continue heart healthy/low-sodium diet.    Peripheral edema-resolved as of 06/14/2021 Continue SCDs Encourage elevation of legs Treating with albumin and lasix Continue to monitor    Subjective:  Patient is afebrile, no chest pain, no shortness of breath.  Reports mild intermittent nausea and 2 episodes of vomiting since yesterday night.  Otherwise tolerating diet and feeling better today.  She reports improvement in her overall lower extremity swelling.  Physical Exam: Vitals:   06/13/21 1615 06/13/21 2153 06/14/21 0600 06/14/21 1332  BP: (!) 155/101 (!) 165/101 (!) 147/93 (!) 168/106  Pulse: 87 94 83 85  Resp: 17 20 18 18   Temp: 97.9 F (36.6 C) 98.4 F (36.9 C) 98.4 F (36.9 C) (!) 97.3 F (36.3 C)  TempSrc: Oral Oral Oral Oral  SpO2: 100% 99% 99% 98%  Weight:      Height:       General exam: Alert, awake, oriented x 3, reporting mild intermittent nausea and 2 episodes of vomiting since yesterday night.  No fever, no chest pain, no shortness of breath. Respiratory system: Clear to auscultation. Respiratory effort normal.  No using accessory muscles.  Good saturation on room air. Cardiovascular system:RRR. No murmurs, rubs, gallops.  No JVD on exam. Gastrointestinal system: Abdomen is nondistended, soft and nontender. No organomegaly or masses felt. Normal bowel sounds heard. Central nervous system: Alert and oriented. No focal neurological deficits. Extremities: No cyanosis or clubbing; bilateral lower extremity edema (2-3+) with venous stasis changes, lichenification and positive skin weeping.  No erythema or open wounds. Skin: No petechiae. Psychiatry: Judgement and insight appear normal. Mood & affect appropriate.    Data Reviewed: Hemoglobin improved at 8.6 Renal function panel demonstrating a stable  potassium level, bicarbonate of 18 and a creatinine of 2.8  Family Communication: All updated at bedside 06/13/2021; and also over the phone at bedside during my evaluation on 06/14/2021.  Disposition: Status is: Inpatient Remains inpatient appropriate because: Requiring IV diuresis to assist with fluid overload/anasarca and to improve pharmacokinetics/efficacy in the treatment of acute on chronic renal failure.    Planned Discharge Destination: Home   Author: Barton Dubois, MD 06/14/2021 3:37 PM  For on call review [email or web address].

## 2021-06-14 NOTE — Assessment & Plan Note (Addendum)
-  Hemoglobin down to 6.4 at time of admission -2 units PRBCs has been transfused this admission -Hgb remains stable after transfusion -IV iron and ESA therapy as per nephrology  -No signs of overt bleeding.

## 2021-06-14 NOTE — Plan of Care (Signed)
°  Problem: Education: Goal: Knowledge of General Education information will improve Description: Including pain rating scale, medication(s)/side effects and non-pharmacologic comfort measures Outcome: Progressing   Problem: Health Behavior/Discharge Planning: Goal: Ability to manage health-related needs will improve Outcome: Progressing   Problem: Clinical Measurements: Goal: Ability to maintain clinical measurements within normal limits will improve Outcome: Progressing Goal: Will remain free from infection Outcome: Progressing Goal: Diagnostic test results will improve Outcome: Progressing Goal: Respiratory complications will improve Outcome: Progressing Goal: Cardiovascular complication will be avoided Outcome: Progressing   Problem: Safety: Goal: Ability to remain free from injury will improve Outcome: Progressing   Problem: Pain Managment: Goal: General experience of comfort will improve Outcome: Progressing   Problem: Coping: Goal: Level of anxiety will decrease Outcome: Progressing   Problem: Nutrition: Goal: Adequate nutrition will be maintained Outcome: Progressing   Problem: Skin Integrity: Goal: Risk for impaired skin integrity will decrease Outcome: Progressing

## 2021-06-14 NOTE — Assessment & Plan Note (Signed)
Continue PPI ?

## 2021-06-14 NOTE — Consult Note (Signed)
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  Her baseline creatinine appears to be around 1.6 and was 2.3 on 05/26/2021.  She has had some challenges with adherence to medications, labs and intermittent use of NSAIDs and suffered significant vancomycin associated AKI back in February 2021.  She follows up with [facility] in the [place] office.  She presented to the emergency room yesterday with worsening leg swelling/skin weeping and had been reportedly adherent to furosemide 80 mg twice daily along with lisinopril 20 mg daily prior to admission.  She denies taking any nonsteroidal anti-inflammatory drugs and has not been adherent to a low-salt diet.  She was found to have an elevated creatinine of 2.9 she was started on furosemide 80 mg IV twice daily and unfortunately has not had any urine output charted from overnight or weight from this morning.  Her mother at her bedside as well as the patient both report "significant improvement of leg swelling".  Past Medical History:  Diagnosis Date   Acanthosis nigricans, acquired    Asthma    Diabetic autonomic neuropathy ([name])    Diabetic peripheral neuropathy ([name])    Environmental allergies    Goiter    Hypoglycemia associated with diabetes ([name])    Tachycardia    Thyroiditis, autoimmune    Type 1 diabetes mellitus in patient age 22-19 years with HbA1C goal below 7.5     Past Surgical History:  Procedure Laterality Date   BIOPSY  08/27/2016   Procedure: BIOPSY;  Surgeon: Danie Binder, MD;  Location: [facility];  Service:  Endoscopy;;  duodenum; gastric   BIOPSY  06/27/2019   Procedure: BIOPSY;  Surgeon: Daneil Dolin, MD;  Location: [facility];  Service: Endoscopy;;   COLONOSCOPY     ESOPHAGOGASTRODUODENOSCOPY N/A 08/27/2016   Dr. Oneida Alar: mild gastritis. Negative celiac. No obvious source for dyspepsia/diarrhea   ESOPHAGOGASTRODUODENOSCOPY (EGD) WITH PROPOFOL N/A 06/27/2019   rourk: Focal abnormality of the gastric mucosa likely due to trauma (heaving).  Biopsy showed mild gastritis, negative for H. pylori.  Esophageal dilation for history of dysphagia but normal-appearing esophagus.    Family History  Problem Relation Age of Onset   Diabetes Mother        Type II DM   Thyroid disease Mother    Diabetes Maternal Grandmother        Type II DM   Diabetes Cousin        Type II DM   Colon cancer Neg Hx    Colon polyps Neg Hx     Social History:  reports that she has never smoked. She has never used smokeless tobacco. She reports that she does not drink alcohol and does not use drugs.  Allergies: No Known Allergies  Medications: I have reviewed the patient's current medications. Scheduled:  feeding supplement  237 mL Oral BID BM   furosemide  80 mg Intravenous BID   heparin  5,000 Units Subcutaneous Q8H   influenza vac split quadrivalent PF  0.5 mL Intramuscular Tomorrow-1000   insulin aspart  0-15 Units Subcutaneous TID WC   insulin aspart  0-5 Units Subcutaneous QHS   insulin glargine-yfgn  8 Units Subcutaneous QHS   melatonin  6 mg Oral QHS   metoprolol tartrate  50 mg Oral BID   rosuvastatin  10 mg Oral Daily   vitamin B-12  1,000 mcg Oral Daily   BMP Latest Ref Rng & Units 06/14/2021 06/13/2021 06/12/2021  Glucose 70 - 99 mg/dL 86 149(H) 129(H)  BUN 6 - 20 mg/dL 48(H) 48(H) 46(H)  Creatinine 0.44 - 1.00 mg/dL 2.81(H) 2.90(H) 2.89(H)  BUN/Creat Ratio 9 - 23 - - -  Sodium 135 - 145 mmol/L 138 140 137  Potassium 3.5 - 5.1 mmol/L 3.9 3.9 3.8  Chloride 98 - 111 mmol/L 112(H) 114(H) 113(H)  CO2  22 - 32 mmol/L 18(L) 19(L) 13(L)  Calcium 8.9 - 10.3 mg/dL 8.1(L) 8.2(L) 8.3(L)   CBC Latest Ref Rng & Units 06/14/2021 06/13/2021 06/12/2021  WBC 4.0 - 10.5 K/uL 8.4 9.3 11.5(H)  Hemoglobin 12.0 - 15.0 g/dL 8.6(L) 6.4(LL) 8.1(L)  Hematocrit 36.0 - 46.0 % 25.6(L) 20.6(L) 25.8(L)  Platelets 150 - 400 K/uL 187 199 236    ECHOCARDIOGRAM COMPLETE  Result Date: 06/13/2021    ECHOCARDIOGRAM REPORT   Patient Name:   VARINA HULON Date of Exam: 06/13/2021 Medical Rec #:  [id number]           Height:       67.0 in Accession #:    [id number]          Weight:       164.8 lb Date of Birth:  30-Dec-1995           BSA:          1.863 m Patient Age:    25 years            BP:           122/78 mmHg Patient Gender: F                   HR:           83 bpm. Exam Location:  [facility] Procedure: 2D Echo, Cardiac Doppler and Color Doppler Indications:    Edema  History:        Patient has no prior history of Echocardiogram examinations.                 Risk Factors:Hypertension and Diabetes.  Sonographer:    Wenda Low Referring Phys: [id number] ASIA B [place]  1. Left ventricular ejection fraction, by estimation, is approximately 50%. There is low normal function. The left ventricle has no wall motion abnormalities. The left ventricular internal cavity size was normal in size. There is mild to moderate asymmetric left ventricular hypertrophy of the posterior segment. Myocardium is densely echogenic. Also somewhat promiment trabeculation at the apex and along lateral wall.  2. Right ventricular systolic function is normal. The right ventricular size is normal. Mildly increased right ventricular wall thickness. There is normal pulmonary artery systolic pressure. The estimated right ventricular systolic pressure is 12.8 mmHg.  3. A small pericardial effusion is present. The pericardial effusion is posterior to the left ventricle and localized near the right atrium.  4. The aortic valve is tricuspid. Aortic  valve regurgitation is not visualized. No aortic stenosis is present. Aortic valve mean gradient measures 4.0 mmHg.  5. The mitral valve is grossly normal. Trivial mitral valve regurgitation.  6. Left atrial size was mildly dilated. Comparison(s): No prior Echocardiogram. When clinically feasible, consider cardiac MRI for further evaluation of possible infiltrative process such as amyloidosis and to exclude ventricular noncompaction. Could also  order limited echocardiogram to obtain  strain imaging. FINDINGS  Left Ventricle: Left ventricular ejection fraction, by estimation, is 50%. The left ventricle has low normal function. The left ventricle has no regional wall motion abnormalities. The left ventricular internal cavity size was normal in size. There is moderate asymmetric left ventricular hypertrophy of the inferior, inferior and inferior segments. Left ventricular diastolic parameters were normal. Right Ventricle: The right ventricular size is normal. Mildly increased right ventricular wall thickness. Right ventricular systolic function is normal. There is normal pulmonary artery systolic pressure. The tricuspid regurgitant velocity is 2.70 m/s, and with an assumed right atrial pressure of 3 mmHg, the estimated right ventricular systolic pressure is 82.9 mmHg. Left Atrium: Left atrial size was mildly dilated. Right Atrium: Right atrial size was normal in size. Pericardium: A small pericardial effusion is present. The pericardial effusion is posterior to the left ventricle and localized near the right atrium. Mitral Valve: The mitral valve is grossly normal. Trivial mitral valve regurgitation. MV peak gradient, 6.2 mmHg. The mean mitral valve gradient is 3.0 mmHg. Tricuspid Valve: The tricuspid valve is grossly normal. Tricuspid valve regurgitation is trivial. Aortic Valve: The aortic valve is tricuspid. Aortic valve regurgitation is not visualized. No aortic stenosis is present. Aortic valve mean gradient  measures 4.0 mmHg. Aortic valve peak gradient measures 7.8 mmHg. Aortic valve area, by VTI measures 1.83 cm. Pulmonic Valve: The pulmonic valve was grossly normal. Pulmonic valve regurgitation is trivial. Aorta: The aortic root is normal in size and structure. IAS/Shunts: There is right bowing of the interatrial septum, suggestive of elevated left atrial pressure. No atrial level shunt detected by color flow Doppler.  LEFT VENTRICLE PLAX 2D LVIDd:         4.30 cm     Diastology LVIDs:         3.30 cm     LV e' medial:    9.03 cm/s LV PW:         1.40 cm     LV E/e' medial:  12.5 LV IVS:        0.90 cm     LV e' lateral:   9.57 cm/s LVOT diam:     1.90 cm     LV E/e' lateral: 11.8 LV SV:         60 LV SV Index:   32 LVOT Area:     2.84 cm  LV Volumes (MOD) LV vol d, MOD A2C: 53.2 ml LV vol d, MOD A4C: 88.1 ml LV vol s, MOD A2C: 31.0 ml LV vol s, MOD A4C: 43.8 ml LV SV MOD A2C:     22.2 ml LV SV MOD A4C:     88.1 ml LV SV MOD BP:      34.9 ml RIGHT VENTRICLE RV Basal diam:  2.95 cm RV Mid diam:    2.20 cm RV S prime:     13.10 cm/s TAPSE (M-mode): 3.5 cm LEFT ATRIUM             Index        RIGHT ATRIUM           Index LA diam:        2.90 cm 1.56 cm/m   RA Area:     17.70 cm LA Vol (A2C):   48.6 ml 26.09 ml/m  RA Volume:   40.00 ml  21.48 ml/m LA Vol (A4C):   78.6 ml 42.20 ml/m LA Biplane Vol: 64.2 ml 34.47 ml/m  AORTIC VALVE  PULMONIC VALVE AV Area (Vmax):    1.96 cm     PV Vmax:       0.68 m/s AV Area (Vmean):   1.98 cm     PV Peak grad:  1.9 mmHg AV Area (VTI):     1.83 cm AV Vmax:           140.00 cm/s AV Vmean:          90.300 cm/s AV VTI:            0.326 m AV Peak Grad:      7.8 mmHg AV Mean Grad:      4.0 mmHg LVOT Vmax:         96.60 cm/s LVOT Vmean:        63.200 cm/s LVOT VTI:          0.210 m LVOT/AV VTI ratio: 0.64  AORTA Ao Asc diam: 2.30 cm MITRAL VALVE                TRICUSPID VALVE MV Area (PHT): 4.36 cm     TR Peak grad:   29.2 mmHg MV Area VTI:   2.18 cm     TR Vmax:         270.00 cm/s MV Peak grad:  6.2 mmHg MV Mean grad:  3.0 mmHg     SHUNTS MV Vmax:       1.25 m/s     Systemic VTI:  0.21 m MV Vmean:      71.8 cm/s    Systemic Diam: 1.90 cm MV Decel Time: 174 msec MV E velocity: 113.00 cm/s MV A velocity: 102.00 cm/s MV E/A ratio:  1.11 Rozann Lesches MD Electronically signed by Rozann Lesches MD Signature Date/Time: 06/13/2021/2:42:35 PM    Final     Review of Systems  Constitutional:  Positive for activity change and fatigue. Negative for chills and fever.  HENT:  Negative for congestion, ear pain, nosebleeds, sore throat and trouble swallowing.   Eyes:  Negative for photophobia and visual disturbance.  Respiratory:  Negative for cough, chest tightness and shortness of breath.   Cardiovascular:  Positive for leg swelling. Negative for chest pain.  Gastrointestinal:  Positive for nausea. Negative for blood in stool, diarrhea and vomiting.  Endocrine: Positive for polydipsia and polyuria.  Genitourinary:  Negative for dysuria, frequency and hematuria.  Musculoskeletal:  Positive for back pain and myalgias. Negative for joint swelling.  Skin:  Positive for color change.  Neurological:  Negative for dizziness, light-headedness and headaches.  Blood pressure (!) 147/93, pulse 83, temperature 98.4 F (36.9 C), temperature source Oral, resp. rate 18, height 5\' 7"  (1.702 m), weight 74.8 kg, SpO2 99 %, unknown if currently breastfeeding. Physical Exam Vitals and nursing note reviewed.  Constitutional:      Appearance: Normal appearance. She is normal weight. She is not ill-appearing.  HENT:     Head: Normocephalic and atraumatic.     Right Ear: External ear normal.     Left Ear: External ear normal.     Nose: Nose normal.     Mouth/Throat:     Mouth: Mucous membranes are moist.     Pharynx: Oropharynx is clear.  Eyes:     General: No scleral icterus.    Extraocular Movements: Extraocular movements intact.     Conjunctiva/sclera: Conjunctivae normal.   Cardiovascular:     Rate and Rhythm: Normal rate and regular rhythm.     Pulses: Normal pulses.  Heart sounds: Normal heart sounds.  Pulmonary:     Effort: Pulmonary effort is normal.     Breath sounds: Normal breath sounds. No wheezing or rales.  Abdominal:     General: Abdomen is flat. There is no distension.     Palpations: Abdomen is soft.     Tenderness: There is no abdominal tenderness.  Musculoskeletal:     Cervical back: Normal range of motion and neck supple.     Right lower leg: Edema present.     Left lower leg: Edema present.     Comments: Bilateral lower extremity venous stasis changes with lichenification.  Nonpitting edema with skin weeping  Skin:    General: Skin is warm and dry.     Findings: Lesion and rash present.  Neurological:     General: No focal deficit present.     Mental Status: She is alert and oriented to person, place, and time. Mental status is at baseline.  Psychiatric:        Mood and Affect: Mood normal.    Assessment/Plan: 1.  Acute kidney injury on chronic kidney disease stage III: Likely from hemodynamic mechanism with decreased effective arterial blood volume in the setting of nephrotic syndrome and significant third spacing with ongoing diuresis/ACE inhibitor use.  Hold ACE inhibitor at this time and switch to intravenous diuretics for improved pharmacokinetics/efficacy.  Renal biopsy showed etiology of her nephrotic syndrome to be diabetic glomerulosclerosis. Avoid nephrotoxic medications including NSAIDs and iodinated intravenous contrast exposure unless the latter is absolutely indicated.  Preferred narcotic agents for pain control are hydromorphone, fentanyl, and methadone. Morphine should not be used. Avoid Baclofen and avoid oral sodium phosphate and magnesium citrate based laxatives / bowel preps. Continue strict Input and Output monitoring. Will monitor the patient closely with you and intervene or adjust therapy as indicated by changes  in clinical status/labs. 2.  Nephrotic syndrome: Secondary to diabetic kidney disease/glomerulosclerosis.  Undertaking diuresis at this time, continue sodium restriction/fluid restriction and hold ACE inhibitor in the setting of acute kidney injury. 3.  Hypertension: Blood pressures marginally elevated, continue to monitor with ongoing diuresis while holding ACE inhibitor. 4.  Anemia of chronic disease: Low hemoglobin/hematocrit likely associated with her chronic kidney disease.  Seen earlier by hematology who suspect that this is predominantly anemia of chronic kidney disease.  Iron stores relatively deficient with iron saturation of 28% and ferritin of 74.  We will give low-dose IV iron/ESA.   Zidane Renner K. 06/14/2021, 12:51 PM

## 2021-06-15 ENCOUNTER — Ambulatory Visit (HOSPITAL_COMMUNITY): Payer: 59

## 2021-06-15 LAB — GLUCOSE, CAPILLARY
Glucose-Capillary: 92 mg/dL (ref 70–99)
Glucose-Capillary: 98 mg/dL (ref 70–99)
Glucose-Capillary: 137 mg/dL — ABNORMAL HIGH (ref 70–99)
Glucose-Capillary: 193 mg/dL — ABNORMAL HIGH (ref 70–99)

## 2021-06-15 LAB — RENAL FUNCTION PANEL
Albumin: 1.7 g/dL — ABNORMAL LOW (ref 3.5–5.0)
Anion gap: 7 (ref 5–15)
BUN: 50 mg/dL — ABNORMAL HIGH (ref 6–20)
CO2: 18 mmol/L — ABNORMAL LOW (ref 22–32)
Calcium: 8 mg/dL — ABNORMAL LOW (ref 8.9–10.3)
Chloride: 112 mmol/L — ABNORMAL HIGH (ref 98–111)
Creatinine, Ser: 2.85 mg/dL — ABNORMAL HIGH (ref 0.44–1.00)
GFR, Estimated: 23 mL/min — ABNORMAL LOW (ref >60)
Glucose, Bld: 101 mg/dL — ABNORMAL HIGH (ref 70–99)
Phosphorus: 6.5 mg/dL — ABNORMAL HIGH (ref 2.5–4.6)
Potassium: 3.7 mmol/L (ref 3.5–5.1)
Sodium: 137 mmol/L (ref 135–145)

## 2021-06-15 LAB — MICROALBUMIN / CREATININE URINE RATIO
Creatinine, Urine: 23.8 mg/dL
Microalb Creat Ratio: 5022 mg/g creat — ABNORMAL HIGH (ref 0–29)
Microalb, Ur: 1195.2 ug/mL — ABNORMAL HIGH

## 2021-06-15 LAB — MAGNESIUM: Magnesium: 1.8 mg/dL (ref 1.7–2.4)

## 2021-06-15 MED ORDER — FUROSEMIDE 10 MG/ML IJ SOLN
80.0000 mg | Freq: Four times a day (QID) | INTRAMUSCULAR | Status: DC
  Administered 2021-06-15 – 2021-06-19 (×16): 80 mg via INTRAVENOUS
  Filled 2021-06-15 (×17): qty 8

## 2021-06-15 NOTE — Progress Notes (Signed)
On Progress Note   Patient: Lindsey Murillo [id number]:[id number] DOB: [birth year] DOA: 06/12/2021     3 DOS: the patient was seen and examined on 06/15/2021   Brief admission narrative: As per H&P written by Dr.Zierle-Ghosh on 06/13/21 Loma Sender Lindsey Murillo is a 26 y.o. female with medical history significant of type 1 diabetes, autoimmune thyroiditis, diabetic neuropathy, hypertension, asthma, and more presents ED with a chief complaint of BL leg weeping.  Patient reports it started about a week ago.  Is been getting worse since it started.  She notices blisters, come up, expand, and then burst.  She has no associated pain.  She reports that her legs are very heavy with fluid as well.  She is becoming more fatigued from trying to ambulate with this fluid overload.  Patient reports she also has dyspnea on exertion for the same reason.  She is not sure if she has orthopnea because she does not attempt to lay flat due to chronic abdominal pain.  Patient denies any chest pain, palpitations.  Patient is following with nephrology.  In February 2021 patient had a vancomycin induced kidney injury, and she has been following with nephrology since that time.  She reports her last visit was last week.  She reports no medications were changed, and her primary complaint there was the leg swelling.  Patient reports the leg swelling has been going on since her hospitalization in February 2021 but has been gradually worsening over an unknown period.  In February 2021 she had negative DVT studies bilaterally.  The conclusion was that patient's third spacing of fluid was secondary to hypoalbuminemia.  She was started on protein shakes.  Mother at bedside reports she has not been using protein shakes.  Patient reports a decreased appetite and early satiety due to chronic abdominal issues.  She reports she has been seen by physician for this problem but has little insight about what conclusions were made.  Patient reports that she eats  too much she immediately has diarrhea.  This has been going on for about a year.  Normal meal for her would be about 2 pieces of pizza.  She reports she eats 3 meals a day.  Patient has no other complaints at this time.  Assessment and Plan: * AKI (acute kidney injury) ([place])- (present on admission) -Patient with stage IIIb prior to admission; currently stage IV based on EGFR. -Will follow recommendations by nephrology service. -Acute kidney injury on chronic renal failure (stage IIIa at baseline).  Last creatinine around  2.3 -Avoid nephrotoxic medications, contrast exposure, hypertension and maintain adequate hydration. -Continue treatment with IV Lasix , dose adjusted as per nephrology recommendations.  Pharmacokinetics/efficacy. -Patient acute failure most likely in the setting of decreased effective arterial blood volume due to nephrotic syndrome and significant third spacing with ongoing home diuresis/ACE inhibitor management. -Will continue to follow nephrology service recommendation. -For now given acute kidney injury her nephrotic syndrome will be managed with sodium restriction and fluid restriction; ACE inhibitor's will continue to be on hold until renal function further improved.  Anemia of chronic disease- (present on admission) -Hemoglobin down to 6.4 -2 units PRBCs has been transfused -Hemoglobin has remained stable. -Continue to follow trend intermittently. -IV iron and ESA therapy as per nephrology service discretion. -No signs of overt bleeding.  Hypoalbuminemia- (present on admission) -Patient has history of nephrotic syndrome but also does not maintain nutrient dense p.o. intake -Advised to use Nepro. -Received treatment with IV albumin.  GERD (gastroesophageal reflux disease)- (  present on admission) -Continue PPI.  Uncontrolled type 1 diabetes mellitus with hyperglycemia ([place])- (present on admission) -Continue current use of sliding scale insulin and basal insulin  while inpatient. -Follow CBGs and further adjust hypoglycemic regimen as required. -Patient will continue outpatient follow-up with endocrinology service.  Essential hypertension, benign- (present on admission) -Will hold ACE inhibitors in the setting of acute kidney injury -Continue the use of metoprolol. -Continue to follow vital signs. -Continue heart healthy/low-sodium diet.    Peripheral edema -still not resolved  -no much changes currently -Cr. Remains in 1.8 range today -Following renal service recommendations will increase Lasix dosage and follow response. -Continue to maintain elevated legs.   Subjective:  No much improvement in her swelling Swelling, Cr remains in 1.8 range. Stable vital signs.   Physical Exam: Vitals:   06/14/21 1332 06/14/21 2132 06/15/21 0558 06/15/21 1346  BP: (!) 168/106 115/77 134/79 (!) 160/103  Pulse: 85 91 81 95  Resp: 18 20 16 20   Temp: (!) 97.3 F (36.3 C) 98.2 F (36.8 C) 99 F (37.2 C) 98.4 F (36.9 C)  TempSrc: Oral Oral  Oral  SpO2: 98% 99% 99% 99%  Weight:   74.7 kg   Height:       General exam: Alert, awake, oriented x 3, reporting intermittent episode of nausea and vomiting.  Still fluid overload and denying any chest pain or shortness of breath. Respiratory system: Clear to auscultation. Respiratory effort normal.  No using accessory muscles.  Good saturation on room air Cardiovascular system:RRR. No murmurs, rubs, gallops.  No JVD. Gastrointestinal system: Abdomen is nondistended, soft and nontender. No organomegaly or masses felt. Normal bowel sounds heard. Central nervous system: Alert and oriented. No focal neurological deficits. Extremities: No cyanosis or clubbing; 2-3+ edema appreciated bilaterally.  Patient with venous assist changes and positive for skin weeping.  No open wounds. Skin: No petechiae. Psychiatry: Judgement and insight appear normal. Mood & affect appropriate.   Data Reviewed: Hemoglobin improved at  8.6 after transfusion; last check. Creatinine level 2.84 Potassium within normal limits (3.7) Magnesium 1.8 Bicarbonate 18 BUN 50. Patient GFR estimated to be around 23.  Family Communication: Mother updated at bedside.  Disposition: Status is: Inpatient Remains inpatient appropriate because: Requiring IV diuresis to assist with fluid overload/anasarca and to improve pharmacokinetics/efficacy in the treatment of acute on chronic renal failure.    Planned Discharge Destination: Home   Author: Barton Dubois, MD 06/15/2021 6:20 PM  For on call review [email or web address].

## 2021-06-15 NOTE — Progress Notes (Signed)
Subjective:  No UOP charted -  BUN and crt stable-  weight stable as well -  she is vague-  says she has been making urine   Objective Vital signs in last 24 hours: Vitals:   06/14/21 0600 06/14/21 1332 06/14/21 2132 06/15/21 0558  BP: (!) 147/93 (!) 168/106 115/77 134/79  Pulse: 83 85 91 81  Resp: 18 18 20 16   Temp: 98.4 F (36.9 C) (!) 97.3 F (36.3 C) 98.2 F (36.8 C) 99 F (37.2 C)  TempSrc: Oral Oral Oral   SpO2: 99% 98% 99% 99%  Weight:    74.7 kg  Height:       Weight change:   Intake/Output Summary (Last 24 hours) at 06/15/2021 0815 Last data filed at 06/14/2021 2000 Gross per 24 hour  Intake 490.31 ml  Output --  Net 490.31 ml    Assessment/ Plan: Pt is a 26 y.o. yo female with biopsy proven diabetic kidney disease who was admitted on 06/12/2021 with  volume overload   Assessment/Plan: 1. Volume overload-  due to her nephrotic syndrome and CKD-  difficult problem to treat.  It does not appear that she is having a great response to lasix 80 q 12-  will inc to q 6 hours.  BP is improved with diuresis and beta blocker-  her ACE is on hold due to AKI 2. 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  No change for now-  she does not appear uremic but is difficult to tell  3. Anemia- due to CKD-   not helping the third spacing-  on IV iron and ESA -  also given 2 units of blood this admission  4. Metabolic acidosis-  due to CKD-  hesitate to treat with bicarb given sodium load     Louis Meckel    Labs: Basic Metabolic Panel: Recent Labs  Lab 06/13/21 0508 06/14/21 0403 06/15/21 0512  NA 140 138 137  K 3.9 3.9 3.7  CL 114* 112* 112*  CO2 19* 18* 18*  GLUCOSE 149* 86 101*  BUN 48* 48* 50*  CREATININE 2.90* 2.81* 2.85*  CALCIUM 8.2* 8.1* 8.0*  PHOS  --  6.3* 6.5*   Liver Function Tests: Recent Labs  Lab 06/12/21 2102 06/13/21 0508 06/14/21 0403 06/15/21 0512   AST 28 19  --   --   ALT 30 22  --   --   ALKPHOS 179* 138*  --   --   BILITOT 0.2* 0.2*  --   --   PROT 6.0* 4.9*  --   --   ALBUMIN 1.9* 1.6* 1.9* 1.7*   No results for input(s): LIPASE, AMYLASE in the last 168 hours. No results for input(s): AMMONIA in the last 168 hours. CBC: Recent Labs  Lab 06/12/21 2102 06/13/21 0508 06/14/21 0403  WBC 11.5* 9.3 8.4  NEUTROABS 7.8* 6.6  --   HGB 8.1* 6.4* 8.6*  HCT 25.8* 20.6* 25.6*  MCV 92.1 88.8 89.5  PLT 236 199 187   Cardiac Enzymes: No results for input(s): CKTOTAL, CKMB, CKMBINDEX, TROPONINI in the last 168 hours. CBG: Recent Labs  Lab 06/14/21 0744 06/14/21 1220 06/14/21 1737 06/14/21 2135 06/15/21 0706  GLUCAP 86 89 86 91 92    Iron Studies: No results for input(s): IRON, TIBC, TRANSFERRIN, FERRITIN in the last 72 hours. Studies/Results: ECHOCARDIOGRAM COMPLETE  Result Date: 06/13/2021    ECHOCARDIOGRAM  REPORT   Patient Name:   Lindsey Murillo Date of Exam: 06/13/2021 Medical Rec #:  [id number]           Height:       67.0 in Accession #:    [id number]          Weight:       164.8 lb Date of Birth:  1995/11/06           BSA:          1.863 m Patient Age:    25 years            BP:           122/78 mmHg Patient Gender: F                   HR:           83 bpm. Exam Location:  [facility] Procedure: 2D Echo, Cardiac Doppler and Color Doppler Indications:    Edema  History:        Patient has no prior history of Echocardiogram examinations.                 Risk Factors:Hypertension and Diabetes.  Sonographer:    Wenda Low Referring Phys: [id number] ASIA B [place]  1. Left ventricular ejection fraction, by estimation, is approximately 50%. There is low normal function. The left ventricle has no wall motion abnormalities. The left ventricular internal cavity size was normal in size. There is mild to moderate asymmetric left ventricular hypertrophy of the posterior segment. Myocardium is densely echogenic. Also  somewhat promiment trabeculation at the apex and along lateral wall.  2. Right ventricular systolic function is normal. The right ventricular size is normal. Mildly increased right ventricular wall thickness. There is normal pulmonary artery systolic pressure. The estimated right ventricular systolic pressure is 09.3 mmHg.  3. A small pericardial effusion is present. The pericardial effusion is posterior to the left ventricle and localized near the right atrium.  4. The aortic valve is tricuspid. Aortic valve regurgitation is not visualized. No aortic stenosis is present. Aortic valve mean gradient measures 4.0 mmHg.  5. The mitral valve is grossly normal. Trivial mitral valve regurgitation.  6. Left atrial size was mildly dilated. Comparison(s): No prior Echocardiogram. When clinically feasible, consider cardiac MRI for further evaluation of possible infiltrative process such as amyloidosis and to exclude ventricular noncompaction. Could also order limited echocardiogram to obtain  strain imaging. FINDINGS  Left Ventricle: Left ventricular ejection fraction, by estimation, is 50%. The left ventricle has low normal function. The left ventricle has no regional wall motion abnormalities. The left ventricular internal cavity size was normal in size. There is moderate asymmetric left ventricular hypertrophy of the inferior, inferior and inferior segments. Left ventricular diastolic parameters were normal. Right Ventricle: The right ventricular size is normal. Mildly increased right ventricular wall thickness. Right ventricular systolic function is normal. There is normal pulmonary artery systolic pressure. The tricuspid regurgitant velocity is 2.70 m/s, and with an assumed right atrial pressure of 3 mmHg, the estimated right ventricular systolic pressure is 81.8 mmHg. Left Atrium: Left atrial size was mildly dilated. Right Atrium: Right atrial size was normal in size. Pericardium: A small pericardial effusion is  present. The pericardial effusion is posterior to the left ventricle and localized near the right atrium. Mitral Valve: The mitral valve is grossly normal. Trivial mitral valve regurgitation. MV peak gradient, 6.2 mmHg. The mean mitral valve gradient is 3.0  mmHg. Tricuspid Valve: The tricuspid valve is grossly normal. Tricuspid valve regurgitation is trivial. Aortic Valve: The aortic valve is tricuspid. Aortic valve regurgitation is not visualized. No aortic stenosis is present. Aortic valve mean gradient measures 4.0 mmHg. Aortic valve peak gradient measures 7.8 mmHg. Aortic valve area, by VTI measures 1.83 cm. Pulmonic Valve: The pulmonic valve was grossly normal. Pulmonic valve regurgitation is trivial. Aorta: The aortic root is normal in size and structure. IAS/Shunts: There is right bowing of the interatrial septum, suggestive of elevated left atrial pressure. No atrial level shunt detected by color flow Doppler.  LEFT VENTRICLE PLAX 2D LVIDd:         4.30 cm     Diastology LVIDs:         3.30 cm     LV e' medial:    9.03 cm/s LV PW:         1.40 cm     LV E/e' medial:  12.5 LV IVS:        0.90 cm     LV e' lateral:   9.57 cm/s LVOT diam:     1.90 cm     LV E/e' lateral: 11.8 LV SV:         60 LV SV Index:   32 LVOT Area:     2.84 cm  LV Volumes (MOD) LV vol d, MOD A2C: 53.2 ml LV vol d, MOD A4C: 88.1 ml LV vol s, MOD A2C: 31.0 ml LV vol s, MOD A4C: 43.8 ml LV SV MOD A2C:     22.2 ml LV SV MOD A4C:     88.1 ml LV SV MOD BP:      34.9 ml RIGHT VENTRICLE RV Basal diam:  2.95 cm RV Mid diam:    2.20 cm RV S prime:     13.10 cm/s TAPSE (M-mode): 3.5 cm LEFT ATRIUM             Index        RIGHT ATRIUM           Index LA diam:        2.90 cm 1.56 cm/m   RA Area:     17.70 cm LA Vol (A2C):   48.6 ml 26.09 ml/m  RA Volume:   40.00 ml  21.48 ml/m LA Vol (A4C):   78.6 ml 42.20 ml/m LA Biplane Vol: 64.2 ml 34.47 ml/m  AORTIC VALVE                    PULMONIC VALVE AV Area (Vmax):    1.96 cm     PV Vmax:        0.68 m/s AV Area (Vmean):   1.98 cm     PV Peak grad:  1.9 mmHg AV Area (VTI):     1.83 cm AV Vmax:           140.00 cm/s AV Vmean:          90.300 cm/s AV VTI:            0.326 m AV Peak Grad:      7.8 mmHg AV Mean Grad:      4.0 mmHg LVOT Vmax:         96.60 cm/s LVOT Vmean:        63.200 cm/s LVOT VTI:          0.210 m LVOT/AV VTI ratio: 0.64  AORTA Ao Asc diam: 2.30 cm MITRAL VALVE  TRICUSPID VALVE MV Area (PHT): 4.36 cm     TR Peak grad:   29.2 mmHg MV Area VTI:   2.18 cm     TR Vmax:        270.00 cm/s MV Peak grad:  6.2 mmHg MV Mean grad:  3.0 mmHg     SHUNTS MV Vmax:       1.25 m/s     Systemic VTI:  0.21 m MV Vmean:      71.8 cm/s    Systemic Diam: 1.90 cm MV Decel Time: 174 msec MV E velocity: 113.00 cm/s MV A velocity: 102.00 cm/s MV E/A ratio:  1.11 Rozann Lesches MD Electronically signed by Rozann Lesches MD Signature Date/Time: 06/13/2021/2:42:35 PM    Final    Medications: Infusions:  ferric gluconate (FERRLECIT) IVPB Stopped (06/14/21 2237)    Scheduled Medications:  darbepoetin (ARANESP) injection - NON-DIALYSIS  60 mcg Subcutaneous Q Sun-1800   feeding supplement  237 mL Oral BID BM   furosemide  80 mg Intravenous BID   heparin  5,000 Units Subcutaneous Q8H   influenza vac split quadrivalent PF  0.5 mL Intramuscular Tomorrow-1000   insulin aspart  0-15 Units Subcutaneous TID WC   insulin aspart  0-5 Units Subcutaneous QHS   insulin glargine-yfgn  8 Units Subcutaneous QHS   melatonin  6 mg Oral QHS   metoprolol tartrate  50 mg Oral BID   rosuvastatin  10 mg Oral Daily   vitamin B-12  1,000 mcg Oral Daily    have reviewed scheduled and prn medications.  Physical Exam: General: just woke her up-  soft spoken-  NAD Heart: RRR Lungs: dec BS at bases Abdomen: obese, soft, non tender Extremities: discolored legs with pitting edema-  evidence of skin breakdown and weeping     06/15/2021,8:15 AM  LOS: 3 days

## 2021-06-15 NOTE — Progress Notes (Signed)
°  Transition of Care [facility]) Screening Note   Patient Details  Name: Lindsey Murillo Date of Birth: June 05, 1995   Transition of Care [facility]) CM/SW Contact:    Ihor Gully, LCSW Phone Number: 06/15/2021, 5:03 PM    Transition of Care Department [facility]) has reviewed patient and no TOC needs have been identified at this time. We will continue to monitor patient advancement through interdisciplinary progression rounds. If new patient transition needs arise, please place a TOC consult.

## 2021-06-16 ENCOUNTER — Encounter (HOSPITAL_COMMUNITY): Payer: Self-pay | Admitting: Hematology

## 2021-06-16 ENCOUNTER — Ambulatory Visit: Payer: 59 | Admitting: "Endocrinology

## 2021-06-16 LAB — CBC
HCT: 28.7 % — ABNORMAL LOW (ref 36.0–46.0)
Hemoglobin: 9.6 g/dL — ABNORMAL LOW (ref 12.0–15.0)
MCH: 29.5 pg (ref 26.0–34.0)
MCHC: 33.4 g/dL (ref 30.0–36.0)
MCV: 88.3 fL (ref 80.0–100.0)
Platelets: 202 10*3/uL (ref 150–400)
RBC: 3.25 MIL/uL — ABNORMAL LOW (ref 3.87–5.11)
RDW: 14 % (ref 11.5–15.5)
WBC: 9 10*3/uL (ref 4.0–10.5)
nRBC: 0 % (ref 0.0–0.2)

## 2021-06-16 LAB — RENAL FUNCTION PANEL
Albumin: 2 g/dL — ABNORMAL LOW (ref 3.5–5.0)
Anion gap: 10 (ref 5–15)
BUN: 51 mg/dL — ABNORMAL HIGH (ref 6–20)
CO2: 17 mmol/L — ABNORMAL LOW (ref 22–32)
Calcium: 8.1 mg/dL — ABNORMAL LOW (ref 8.9–10.3)
Chloride: 109 mmol/L (ref 98–111)
Creatinine, Ser: 3 mg/dL — ABNORMAL HIGH (ref 0.44–1.00)
GFR, Estimated: 21 mL/min — ABNORMAL LOW (ref >60)
Glucose, Bld: 123 mg/dL — ABNORMAL HIGH (ref 70–99)
Phosphorus: 6.1 mg/dL — ABNORMAL HIGH (ref 2.5–4.6)
Potassium: 3.7 mmol/L (ref 3.5–5.1)
Sodium: 136 mmol/L (ref 135–145)

## 2021-06-16 LAB — GLUCOSE, CAPILLARY
Glucose-Capillary: 144 mg/dL — ABNORMAL HIGH (ref 70–99)
Glucose-Capillary: 131 mg/dL — ABNORMAL HIGH (ref 70–99)
Glucose-Capillary: 93 mg/dL (ref 70–99)
Glucose-Capillary: 125 mg/dL — ABNORMAL HIGH (ref 70–99)

## 2021-06-16 NOTE — Progress Notes (Signed)
Subjective:   2350 UOP charted -    weight down 8 pounds since 2/4-  no labs this AM-  I have ordered   Objective Vital signs in last 24 hours: Vitals:   06/15/21 2258 06/16/21 0256 06/16/21 0500 06/16/21 0800  BP: (!) 145/98 (!) 146/109  (!) 148/92  Pulse: 86 86  87  Resp: 18   16  Temp: 98.3 F (36.8 C)     TempSrc: Oral     SpO2: 100%  100% 100%  Weight:   71 kg   Height:       Weight change: -3.7 kg  Intake/Output Summary (Last 24 hours) at 06/16/2021 0854 Last data filed at 06/16/2021 0500 Gross per 24 hour  Intake 1320 ml  Output 2850 ml  Net -1530 ml    Assessment/ Plan: Pt is a 26 y.o. yo female with biopsy proven diabetic kidney disease- nephrotic syndrome who was admitted on 06/12/2021 with volume overload   Assessment/Plan: 1. Volume overload-  due to her nephrotic syndrome and CKD-  difficult problem to treat.  UOP better on q 6 IV lasix.  BP is improved with diuresis and beta blocker-  her ACE is on hold due to AKI-  Mom says her normal weight is in the 130's-  still 156-  continue high dose IV diuresis today  2. 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  No change for now-  she does not appear uremic but is difficult to tell -   checking labs today  3. Anemia- due to CKD-   not helping the third spacing-  on IV iron and ESA -  also given 2 units of blood this admission - check CBC today as well  4. Metabolic acidosis-  due to CKD-  hesitate to treat with oral  bicarb given sodium load     Louis Meckel    Labs: Basic Metabolic Panel: Recent Labs  Lab 06/13/21 0508 06/14/21 0403 06/15/21 0512  NA 140 138 137  K 3.9 3.9 3.7  CL 114* 112* 112*  CO2 19* 18* 18*  GLUCOSE 149* 86 101*  BUN 48* 48* 50*  CREATININE 2.90* 2.81* 2.85*  CALCIUM 8.2* 8.1* 8.0*  PHOS  --  6.3* 6.5*   Liver Function Tests: Recent Labs  Lab 06/12/21 2102 06/13/21 0508 06/14/21 0403  06/15/21 0512  AST 28 19  --   --   ALT 30 22  --   --   ALKPHOS 179* 138*  --   --   BILITOT 0.2* 0.2*  --   --   PROT 6.0* 4.9*  --   --   ALBUMIN 1.9* 1.6* 1.9* 1.7*   No results for input(s): LIPASE, AMYLASE in the last 168 hours. No results for input(s): AMMONIA in the last 168 hours. CBC: Recent Labs  Lab 06/12/21 2102 06/13/21 0508 06/14/21 0403  WBC 11.5* 9.3 8.4  NEUTROABS 7.8* 6.6  --   HGB 8.1* 6.4* 8.6*  HCT 25.8* 20.6* 25.6*  MCV 92.1 88.8 89.5  PLT 236 199 187   Cardiac Enzymes: No results for input(s): CKTOTAL, CKMB, CKMBINDEX, TROPONINI in the last 168 hours. CBG: Recent Labs  Lab 06/15/21 0706 06/15/21 1057 06/15/21 1615 06/15/21 2251 06/16/21 0811  GLUCAP 92 98 137* 193* 144*    Iron Studies: No results for input(s): IRON, TIBC, TRANSFERRIN, FERRITIN in the last 72 hours. Studies/Results:  No results found. Medications: Infusions:    Scheduled Medications:  darbepoetin (ARANESP) injection - NON-DIALYSIS  60 mcg Subcutaneous Q Sun-1800   feeding supplement  237 mL Oral BID BM   furosemide  80 mg Intravenous Q6H   heparin  5,000 Units Subcutaneous Q8H   insulin aspart  0-15 Units Subcutaneous TID WC   insulin aspart  0-5 Units Subcutaneous QHS   insulin glargine-yfgn  8 Units Subcutaneous QHS   melatonin  6 mg Oral QHS   metoprolol tartrate  50 mg Oral BID   rosuvastatin  10 mg Oral Daily   vitamin B-12  1,000 mcg Oral Daily    have reviewed scheduled and prn medications.  Physical Exam: General: eating breakfast  Heart: RRR Lungs: dec BS at bases Abdomen: obese, soft, non tender Extremities: discolored legs with pitting edema-  evidence of skin breakdown and weeping -  dryer for now     06/16/2021,8:54 AM  LOS: 4 days

## 2021-06-16 NOTE — Progress Notes (Signed)
Patient blood pressure elevated. 146/109. MD Zierle-Ghosh made aware. Will continue to monitor.

## 2021-06-16 NOTE — Progress Notes (Signed)
Patient telemetry box kept going off that patient was in apnea. Patient leads checked and all in place. Patient put on 2L just while sleeping. Will continue to monitor.

## 2021-06-16 NOTE — Progress Notes (Signed)
On Progress Note   Patient: Lindsey Murillo VQM:[id number] DOB: 1995/08/21 DOA: 06/12/2021     4 DOS: the patient was seen and examined on 06/16/2021   Brief admission narrative: As per H&P written by Dr.Zierle-Ghosh on 06/13/21 Loma Sender Seaberry is a 26 y.o. female with medical history significant of type 1 diabetes, autoimmune thyroiditis, diabetic neuropathy, hypertension, asthma, and more presents ED with a chief complaint of BL leg weeping.  Patient reports it started about a week ago.  Is been getting worse since it started.  She notices blisters, come up, expand, and then burst.  She has no associated pain.  She reports that her legs are very heavy with fluid as well.  She is becoming more fatigued from trying to ambulate with this fluid overload.  Patient reports she also has dyspnea on exertion for the same reason.  She is not sure if she has orthopnea because she does not attempt to lay flat due to chronic abdominal pain.  Patient denies any chest pain, palpitations.  Patient is following with nephrology.  In February 2021 patient had a vancomycin induced kidney injury, and she has been following with nephrology since that time.  She reports her last visit was last week.  She reports no medications were changed, and her primary complaint there was the leg swelling.  Patient reports the leg swelling has been going on since her hospitalization in February 2021 but has been gradually worsening over an unknown period.  In February 2021 she had negative DVT studies bilaterally.  The conclusion was that patient's third spacing of fluid was secondary to hypoalbuminemia.  She was started on protein shakes.  Mother at bedside reports she has not been using protein shakes.  Patient reports a inhaler lower extremity swelling.  Appetite and early satiety due to chronic abdominal issues.  She reports she has been seen by physician for this problem but has little insight about what conclusions were made.  Patient  reports that she eats too much she immediately has diarrhea.  This has been going on for about a year.  Normal meal for her would be about 2 pieces of pizza.  She reports she eats 3 meals a day.  Patient has no other complaints at this time.  Assessment and Plan: * AKI (acute kidney injury) ([place])- (present on admission) -Patient with stage IIIb prior to admission; currently stage IV based on EGFR. -Patient reports increasing her urine output and also -Acute kidney injury on chronic renal failure (stage IIIa at baseline).   -Creatinine 3.0 now -Avoid nephrotoxic medications, contrast exposure, hypertension and maintain adequate hydration. -Continue treatment with IV Lasix , dose adjusted as per nephrology recommendations.  Pharmacokinetics/efficacy. -Patient acute failure most likely in the setting of decreased effective arterial blood volume due to nephrotic syndrome and significant third spacing with ongoing home diuresis/ACE inhibitor management. -Will continue to follow nephrology service recommendation. -For now given acute kidney injury her nephrotic syndrome will be managed with sodium restriction and fluid restriction; ACE inhibitor's will continue to be on hold until renal function further improved. -patient transitioning into stage 4 at this time.   Anemia of chronic disease- (present on admission) -Hemoglobin down to 6.4 at time of admission. -2 units PRBCs has been transfused -Hemoglobin up to 9.6. -Continue to follow trend intermittently. -IV iron and ESA therapy as per nephrology service discretion. -No signs of overt bleeding.  Hypoalbuminemia- (present on admission) -Patient has history of nephrotic syndrome but also does not maintain  nutrient dense p.o. intake -Advised to use Nepro. -Received treatment with IV albumin. -Advised to maintain adequate hydration.  GERD (gastroesophageal reflux disease)- (present on admission) -Continue PPI.  Uncontrolled type 1 diabetes  mellitus with hyperglycemia ([place])- (present on admission) -Continue current use of sliding scale insulin and basal insulin while inpatient. -Follow CBGs and further adjust hypoglycemic regimen as required. -Patient will continue outpatient follow-up with endocrinology service.  Essential hypertension, benign- (present on admission) -Will hold ACE inhibitors in the setting of acute kidney injury -Continue the use of metoprolol. -Continue to follow vital signs. -Continue heart healthy/low-sodium diet.    Peripheral edema -still not resolved  -no much changes currently; patient reported increasing urine output and decrease in her lower extremity swelling. -Cr.  3.0 -Following renal service recommendations will increase Lasix dosage and follow response. -Continue to maintain legs elevated as much as possible.   Subjective:  Patient reports increasing her urine output overnight; no chest pain, no further episode of vomiting.  Still slightly nauseated.  Expressed decrease in the swelling of her legs.  Physical Exam: Vitals:   06/16/21 0256 06/16/21 0500 06/16/21 0800 06/16/21 1418  BP: (!) 146/109  (!) 148/92 (!) 162/106  Pulse: 86  87 85  Resp:   16 18  Temp:    97.7 F (36.5 C)  TempSrc:    Oral  SpO2:  100% 100% 100%  Weight:  71 kg    Height:       General exam: Alert, awake, oriented x 3, reports no further episode of vomiting.  Still slightly nauseated.  Denies chest pain, no abdominal pain, no shortness of breath.  Expressed increase in her urine output and also some improvement in her lower extremity swelling. Respiratory system: Clear to auscultation. Respiratory effort normal.  No using accessory muscle.  Good saturation on room air. Cardiovascular system:RRR. No murmurs, rubs, gallops.  No JVD Gastrointestinal system: Abdomen is nondistended, soft and nontender. No organomegaly or masses felt. Normal bowel sounds heard. Central nervous system: Alert and oriented. No focal  neurological deficits. Extremities: No cyanosis or clubbing; 2-3+ edema bilaterally.  Chronic venous stasis changes appreciated. Skin: No petechiae. Psychiatry: Judgement and insight appear normal. Mood & affect appropriate.   Data Reviewed: Hemoglobin 9.6 currently Creatinine level 3.0  Potassium within normal limits (3.7) Bicarbonate 17 BUN 51 Patient GFR estimated to be around 23 currently.  Family Communication: Mother updated at bedside.  Disposition: Status is: Inpatient Remains inpatient appropriate because: Requiring IV diuresis to assist with fluid overload/anasarca and to improve pharmacokinetics/efficacy in the treatment of acute on chronic renal failure.    Planned Discharge Destination: Home   Author: Barton Dubois, MD 06/16/2021 5:55 PM  For on call review [email or web address].

## 2021-06-17 DIAGNOSIS — I872 Venous insufficiency (chronic) (peripheral): Secondary | ICD-10-CM

## 2021-06-17 DIAGNOSIS — N184 Chronic kidney disease, stage 4 (severe): Secondary | ICD-10-CM

## 2021-06-17 DIAGNOSIS — E876 Hypokalemia: Secondary | ICD-10-CM

## 2021-06-17 LAB — GLUCOSE, CAPILLARY
Glucose-Capillary: 41 mg/dL — CL (ref 70–99)
Glucose-Capillary: 50 mg/dL — ABNORMAL LOW (ref 70–99)
Glucose-Capillary: 88 mg/dL (ref 70–99)
Glucose-Capillary: 95 mg/dL (ref 70–99)
Glucose-Capillary: 108 mg/dL — ABNORMAL HIGH (ref 70–99)
Glucose-Capillary: 136 mg/dL — ABNORMAL HIGH (ref 70–99)

## 2021-06-17 LAB — RENAL FUNCTION PANEL
Albumin: 1.8 g/dL — ABNORMAL LOW (ref 3.5–5.0)
Anion gap: 7 (ref 5–15)
BUN: 51 mg/dL — ABNORMAL HIGH (ref 6–20)
CO2: 19 mmol/L — ABNORMAL LOW (ref 22–32)
Calcium: 8.2 mg/dL — ABNORMAL LOW (ref 8.9–10.3)
Chloride: 110 mmol/L (ref 98–111)
Creatinine, Ser: 2.93 mg/dL — ABNORMAL HIGH (ref 0.44–1.00)
GFR, Estimated: 22 mL/min — ABNORMAL LOW (ref >60)
Glucose, Bld: 54 mg/dL — ABNORMAL LOW (ref 70–99)
Phosphorus: 6 mg/dL — ABNORMAL HIGH (ref 2.5–4.6)
Potassium: 3.4 mmol/L — ABNORMAL LOW (ref 3.5–5.1)
Sodium: 136 mmol/L (ref 135–145)

## 2021-06-17 MED ORDER — INSULIN ASPART 100 UNIT/ML IJ SOLN
0.0000 [IU] | Freq: Every day | INTRAMUSCULAR | Status: DC
  Administered 2021-06-19 – 2021-06-22 (×2): 2 [IU] via SUBCUTANEOUS

## 2021-06-17 MED ORDER — POTASSIUM CHLORIDE CRYS ER 10 MEQ PO TBCR
10.0000 meq | EXTENDED_RELEASE_TABLET | Freq: Once | ORAL | Status: AC
  Administered 2021-06-17: 10 meq via ORAL
  Filled 2021-06-17: qty 1

## 2021-06-17 MED ORDER — INSULIN ASPART 100 UNIT/ML IJ SOLN
0.0000 [IU] | Freq: Three times a day (TID) | INTRAMUSCULAR | Status: DC
  Administered 2021-06-18 – 2021-06-20 (×3): 1 [IU] via SUBCUTANEOUS
  Administered 2021-06-22: 2 [IU] via SUBCUTANEOUS

## 2021-06-17 NOTE — Assessment & Plan Note (Addendum)
See pics of legs Not sure compression dressings will help>>ordered trial Significant lichenification -consult wound care nurse>>appreciate recommendations>> Wash legs with soap and water. Pat dry. Apply Sween Moisturizing Ointment. Beginning behind the toes and going to just below the knees, spiral wrap kerlix, then 4 inch ace wrap. Perform daily.

## 2021-06-17 NOTE — Hospital Course (Addendum)
26 y.o. female with medical history significant of type 1 diabetes, autoimmune thyroiditis, diabetic neuropathy,  nephrotic syndrome, hypertension, asthma, and more presents ED with a chief complaint of BL leg weeping and edema x 1 week.   She notices blisters, come up, expand, and then burst.  She has no associated pain.  She reports that her legs are very heavy with fluid as well.  She is becoming more fatigued from trying to ambulate with this fluid overload.  Patient reports she also has dyspnea on exertion. She is not sure if she has orthopnea because she does not attempt to lay flat due to chronic abdominal pain.  Patient denies any f/c, n/v chest pain, palpitations.  Patient is following with nephrology.  In February 2021 patient had a vancomycin induced kidney injury, and she has been following with nephrology since that time.  She reports her last visit was last week.  She reports no medications were changed, and her primary complaint there was the leg swelling.  Patient reports the leg swelling has been going on since her hospitalization in February 2021 but has been gradually worsening over an unknown period.  In February 2021 she had negative DVT studies bilaterally.  The conclusion was that patient's third spacing of fluid was secondary to hypoalbuminemia.  She was started on protein shakes.  Mother at bedside reports she has not been using protein shakes.  Patient reports worsen lower extremity swelling.  Nephrology was consulted to assist with management.  IV lasix was started and titrated to 80 mg IV q 6 hours with slow improvement.  IV albumin also added as well as protein supplementation.  Wound care consult was placed for LE edema changes>>Wash legs with soap and water. Pat dry. Apply Sween Moisturizing Ointment. Beginning behind the toes and going to just below the knees, spiral wrap kerlix, then 4 inch ace wrap. Perform daily.  Overall weight is slowly trending down, but now seems to now to  plateau at 137 lbs.  Serum creatinine remains stable on IV lasix.  She continued to have good UO with stable renal function.  She was cleared by renal to d/c home on 06/23/21 with torsemide 60 mg bid and Klor-con 40 meQ daily.  Discharge weight 135 lbs.  She will follow up with nephrology in 1 week for renal panel.   She was educated extensively regarind fluid and salt restriction after dc.

## 2021-06-17 NOTE — Plan of Care (Signed)

## 2021-06-17 NOTE — Progress Notes (Signed)
Subjective:   Not as much UOP as yesterday.  Discussed fluid and salt restriction with pt and mom at bedside.    Objective Vital signs in last 24 hours: Vitals:   06/16/21 1947 06/16/21 2104 06/17/21 0347 06/17/21 0625  BP:  (!) 160/103 (!) 140/92   Pulse:  91 83   Resp:  19 14   Temp:  98.3 F (36.8 C) 98 F (36.7 C)   TempSrc:  Oral Oral   SpO2: 99% 100% 99%   Weight:    73.4 kg  Height:       Weight change: 2.4 kg  Intake/Output Summary (Last 24 hours) at 06/17/2021 1007 Last data filed at 06/17/2021 0900 Gross per 24 hour  Intake 920 ml  Output 1602 ml  Net -682 ml    Assessment/ Plan: Pt is a 26 y.o. yo female with biopsy proven diabetic kidney disease- nephrotic syndrome who was admitted on 06/12/2021 with volume overload   Assessment/Plan: 1. Volume overload-  due to her nephrotic syndrome and CKD-  difficult problem to treat.  UOP better on q 6 IV lasix.  BP is improved with diuresis and beta blocker-  her ACE is on hold due to AKI-  Mom says her normal weight is in the 130's-  still 156-  continue high dose IV diuresis- expect will need 24-48 ish more hrs.  ? If UNNA boots will help 2. 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  No change for now 3. Anemia- due to CKD-   not helping the third spacing-  on IV iron and ESA -  also given 2 units of blood this admission.  Hgb 9.6 4. Metabolic acidosis-  due to CKD-  hesitate to treat with oral  bicarb given sodium load, improving    Lindsey Murillo    Labs: Basic Metabolic Panel: Recent Labs  Lab 06/15/21 0512 06/16/21 0923 06/17/21 0604  NA 137 136 136  K 3.7 3.7 3.4*  CL 112* 109 110  CO2 18* 17* 19*  GLUCOSE 101* 123* 54*  BUN 50* 51* 51*  CREATININE 2.85* 3.00* 2.93*  CALCIUM 8.0* 8.1* 8.2*  PHOS 6.5* 6.1* 6.0*   Liver Function Tests: Recent Labs  Lab 06/12/21 2102 06/13/21 0508 06/14/21 0403 06/15/21 0512  06/16/21 0923 06/17/21 0604  AST 28 19  --   --   --   --   ALT 30 22  --   --   --   --   ALKPHOS 179* 138*  --   --   --   --   BILITOT 0.2* 0.2*  --   --   --   --   PROT 6.0* 4.9*  --   --   --   --   ALBUMIN 1.9* 1.6*   < > 1.7* 2.0* 1.8*   < > = values in this interval not displayed.   No results for input(s): LIPASE, AMYLASE in the last 168 hours. No results for input(s): AMMONIA in the last 168 hours. CBC: Recent Labs  Lab 06/12/21 2102 06/13/21 0508 06/14/21 0403 06/16/21 0923  WBC 11.5* 9.3 8.4 9.0  NEUTROABS 7.8* 6.6  --   --   HGB 8.1* 6.4* 8.6* 9.6*  HCT 25.8* 20.6* 25.6* 28.7*  MCV 92.1 88.8 89.5 88.3  PLT 236 199 187 202   Cardiac Enzymes: No results for input(s): CKTOTAL, CKMB, CKMBINDEX, TROPONINI  in the last 168 hours. CBG: Recent Labs  Lab 06/16/21 1557 06/16/21 2117 06/17/21 0727 06/17/21 0750 06/17/21 0839  GLUCAP 93 125* 41* 50* 88    Iron Studies: No results for input(s): IRON, TIBC, TRANSFERRIN, FERRITIN in the last 72 hours. Studies/Results: No results found. Medications: Infusions:    Scheduled Medications:  darbepoetin (ARANESP) injection - NON-DIALYSIS  60 mcg Subcutaneous Q Sun-1800   feeding supplement  237 mL Oral BID BM   furosemide  80 mg Intravenous Q6H   heparin  5,000 Units Subcutaneous Q8H   insulin aspart  0-15 Units Subcutaneous TID WC   insulin aspart  0-5 Units Subcutaneous QHS   insulin glargine-yfgn  8 Units Subcutaneous QHS   melatonin  6 mg Oral QHS   metoprolol tartrate  50 mg Oral BID   rosuvastatin  10 mg Oral Daily   vitamin B-12  1,000 mcg Oral Daily    have reviewed scheduled and prn medications.  Physical Exam: General: eating breakfast  Heart: RRR Lungs: dec BS at bases Abdomen: obese, soft, non tender Extremities: discolored legs with pitting edema-  evidence of skin breakdown and weeping -  dryer for now     06/17/2021,10:07 AM  LOS: 5 days

## 2021-06-17 NOTE — Progress Notes (Addendum)
PROGRESS NOTE  Lindsey Murillo PJA:[id number] DOB: [birth year] DOA: 06/12/2021 PCP: Coral Spikes, DO  Brief History:  26 y.o. female with medical history significant of type 1 diabetes, autoimmune thyroiditis, diabetic neuropathy,  nephrotic syndrome, hypertension, asthma, and more presents ED with a chief complaint of BL leg weeping and edema x 1 week.   She notices blisters, come up, expand, and then burst.  She has no associated pain.  She reports that her legs are very heavy with fluid as well.  She is becoming more fatigued from trying to ambulate with this fluid overload.  Patient reports she also has dyspnea on exertion. She is not sure if she has orthopnea because she does not attempt to lay flat due to chronic abdominal pain.  Patient denies any f/c, n/v chest pain, palpitations.  Patient is following with nephrology.  In February 2021 patient had a vancomycin induced kidney injury, and she has been following with nephrology since that time.  She reports her last visit was last week.  She reports no medications were changed, and her primary complaint there was the leg swelling.  Patient reports the leg swelling has been going on since her hospitalization in February 2021 but has been gradually worsening over an unknown period.  In February 2021 she had negative DVT studies bilaterally.  The conclusion was that patient's third spacing of fluid was secondary to hypoalbuminemia.  She was started on protein shakes.  Mother at bedside reports she has not been using protein shakes.  Patient reports worsen lower extremity swelling.  Nephrology was consulted to assist with management.  IV lasix was started and titrated to 80 mg IV q 6 hours with slow improvement.  Wound care consult was placed for LE edema changes.    Assessment and Plan: * Acute renal failure superimposed on stage 4 chronic kidney disease ([name]) -Last creatinine around  2.3 -now new baseline 2.8-3.0 -remains fluid  overloaded -continue IV lasix 80 mg q 6 hrs -setting of decreased effective arterial blood volume due to nephrotic syndrome and significant third spacing with ongoing home diuresis/ACE inhibitor management. -appreciate nephrology -hold ACEi   Venous stasis dermatitis See pics of legs Not sure compression dressings will help Significant lichenification -consult wound care nurse  Anemia of chronic disease- (present on admission) -Hemoglobin down to 6.4 at time of admission -2 units PRBCs has been transfused this admission -Repeat hemoglobin after transfusion demonstrating a stable level at 8.6. -IV iron and ESA therapy as per nephrology  -No signs of overt bleeding.  Peripheral edema Continue SCDs Encourage elevation of legs Treating with albumin and lasix Continue to monitor  Hypoalbuminemia- (present on admission) -Patient has history of nephrotic syndrome but also does not maintain nutrient dense p.o. intake -Advised to use Nepro. -Received treatment with IV albumin.  GERD (gastroesophageal reflux disease)- (present on admission) -Continue PPI.  Uncontrolled type 1 diabetes mellitus with hyperglycemia ([place])- (present on admission) -Continue current use of sliding scale insulin and basal insulin while inpatient. -decrease to sliding scale for renal patient due to hypoglycemia   Essential hypertension, benign- (present on admission) -d/c ACEi -Continue metoprolol. -anticipate improvement with diuressis         Status is: Inpatient Remains inpatient appropriate because: remains fluid overloaded requiring IV lasix            Family Communication:   Mother updated at bedside 2/8  Consultants:  renal  Code Status:  FULL  DVT Prophylaxis:  [place] Heparin    Procedures: As Listed in Progress Note Above  Antibiotics: None         Subjective: Patient denies fevers, chills, headache, chest pain, dyspnea, nausea, vomiting, diarrhea,  dysuria,  hematuria, hematochezia, and melena.   Objective: Vitals:   06/16/21 2104 06/17/21 0347 06/17/21 0625 06/17/21 1401  BP: (!) 160/103 (!) 140/92  (!) 160/104  Pulse: 91 83  92  Resp: 19 14  15   Temp: 98.3 F (36.8 C) 98 F (36.7 C)  98.5 F (36.9 C)  TempSrc: Oral Oral  Axillary  SpO2: 100% 99%  99%  Weight:   73.4 kg   Height:        Intake/Output Summary (Last 24 hours) at 06/17/2021 1722 Last data filed at 06/17/2021 0900 Gross per 24 hour  Intake 440 ml  Output 502 ml  Net -62 ml   Weight change: 2.4 kg Exam:  General:  Pt is alert, follows commands appropriately, not in acute distress HEENT: No icterus, No thrush, No neck mass, [place]/AT Cardiovascular: RRR, S1/S2, no rubs, no gallops Respiratory: bibasilar crackles. No wheeze Abdomen: Soft/+BS, non tender, non distended, no guarding Extremities: 1+LE edema, No lymphangitis, No petechiae, No rashes, no synovitis +lichenification   Data Reviewed: I have personally reviewed following labs and imaging studies Basic Metabolic Panel: Recent Labs  Lab 06/13/21 0508 06/14/21 0403 06/15/21 0512 06/16/21 0923 06/17/21 0604  NA 140 138 137 136 136  K 3.9 3.9 3.7 3.7 3.4*  CL 114* 112* 112* 109 110  CO2 19* 18* 18* 17* 19*  GLUCOSE 149* 86 101* 123* 54*  BUN 48* 48* 50* 51* 51*  CREATININE 2.90* 2.81* 2.85* 3.00* 2.93*  CALCIUM 8.2* 8.1* 8.0* 8.1* 8.2*  MG 1.8  --  1.8  --   --   PHOS  --  6.3* 6.5* 6.1* 6.0*   Liver Function Tests: Recent Labs  Lab 06/12/21 2102 06/13/21 0508 06/14/21 0403 06/15/21 0512 06/16/21 0923 06/17/21 0604  AST 28 19  --   --   --   --   ALT 30 22  --   --   --   --   ALKPHOS 179* 138*  --   --   --   --   BILITOT 0.2* 0.2*  --   --   --   --   PROT 6.0* 4.9*  --   --   --   --   ALBUMIN 1.9* 1.6* 1.9* 1.7* 2.0* 1.8*   No results for input(s): LIPASE, AMYLASE in the last 168 hours. No results for input(s): AMMONIA in the last 168 hours. Coagulation Profile: No results for  input(s): INR, PROTIME in the last 168 hours. CBC: Recent Labs  Lab 06/12/21 2102 06/13/21 0508 06/14/21 0403 06/16/21 0923  WBC 11.5* 9.3 8.4 9.0  NEUTROABS 7.8* 6.6  --   --   HGB 8.1* 6.4* 8.6* 9.6*  HCT 25.8* 20.6* 25.6* 28.7*  MCV 92.1 88.8 89.5 88.3  PLT 236 199 187 202   Cardiac Enzymes: No results for input(s): CKTOTAL, CKMB, CKMBINDEX, TROPONINI in the last 168 hours. BNP: Invalid input(s): POCBNP CBG: Recent Labs  Lab 06/17/21 0727 06/17/21 0750 06/17/21 0839 06/17/21 1100 06/17/21 1635  GLUCAP 41* 50* 88 95 108*   HbA1C: No results for input(s): HGBA1C in the last 72 hours. Urine analysis:    Component Value Date/Time   COLORURINE YELLOW 06/13/2021 1607   APPEARANCEUR CLEAR 06/13/2021 1607   LABSPEC  1.025 06/13/2021 1607   PHURINE 5.5 06/13/2021 1607   GLUCOSEU 100 (A) 06/13/2021 1607   HGBUR SMALL (A) 06/13/2021 1607   BILIRUBINUR NEGATIVE 06/13/2021 1607   KETONESUR NEGATIVE 06/13/2021 1607   PROTEINUR 100 (A) 06/13/2021 1607   UROBILINOGEN 0.2 01/07/2015 1723   NITRITE NEGATIVE 06/13/2021 1607   LEUKOCYTESUR NEGATIVE 06/13/2021 1607   Sepsis Labs: @LABRCNTIP (procalcitonin:4,lacticidven:4) ) Recent Results (from the past 240 hour(s))  Resp Panel by RT-PCR (Flu A&B, Covid) Nasopharyngeal Swab     Status: None   Collection Time: 06/12/21 10:47 PM   Specimen: Nasopharyngeal Swab; Nasopharyngeal(NP) swabs in vial transport medium  Result Value Ref Range Status   SARS Coronavirus 2 by RT PCR NEGATIVE NEGATIVE Final    Comment: (NOTE) SARS-CoV-2 target nucleic acids are NOT DETECTED.  The SARS-CoV-2 RNA is generally detectable in upper respiratory specimens during the acute phase of infection. The lowest concentration of SARS-CoV-2 viral copies this assay can detect is 138 copies/mL. A negative result does not preclude SARS-Cov-2 infection and should not be used as the sole basis for treatment or other patient management decisions. A negative  result may occur with  improper specimen collection/handling, submission of specimen other than nasopharyngeal swab, presence of viral mutation(s) within the areas targeted by this assay, and inadequate number of viral copies(<138 copies/mL). A negative result must be combined with clinical observations, patient history, and epidemiological information. The expected result is Negative.  Fact Sheet for Patients:  EntrepreneurPulse.com.au  Fact Sheet for Healthcare Providers:  IncredibleEmployment.be  This test is no t yet approved or cleared by the [place] FDA and  has been authorized for detection and/or diagnosis of SARS-CoV-2 by FDA under an Emergency Use Authorization (EUA). This EUA will remain  in effect (meaning this test can be used) for the duration of the COVID-19 declaration under Section 564(b)(1) of the Act, 21 U.S.C.section 360bbb-3(b)(1), unless the authorization is terminated  or revoked sooner.       Influenza A by PCR NEGATIVE NEGATIVE Final   Influenza B by PCR NEGATIVE NEGATIVE Final    Comment: (NOTE) The Xpert Xpress SARS-CoV-2/FLU/RSV plus assay is intended as an aid in the diagnosis of influenza from Nasopharyngeal swab specimens and should not be used as a sole basis for treatment. Nasal washings and aspirates are unacceptable for Xpert Xpress SARS-CoV-2/FLU/RSV testing.  Fact Sheet for Patients: EntrepreneurPulse.com.au  Fact Sheet for Healthcare Providers: IncredibleEmployment.be  This test is not yet approved or cleared by the [place] FDA and has been authorized for detection and/or diagnosis of SARS-CoV-2 by FDA under an Emergency Use Authorization (EUA). This EUA will remain in effect (meaning this test can be used) for the duration of the COVID-19 declaration under Section 564(b)(1) of the Act, 21 U.S.C. section 360bbb-3(b)(1), unless the authorization is  terminated or revoked.  Performed at [facility], [address], [place], [name] [address]      Scheduled Meds:  darbepoetin (ARANESP) injection - NON-DIALYSIS  60 mcg Subcutaneous Q Sun-1800   feeding supplement  237 mL Oral BID BM   furosemide  80 mg Intravenous Q6H   heparin  5,000 Units Subcutaneous Q8H   insulin aspart  0-5 Units Subcutaneous QHS   insulin aspart  0-6 Units Subcutaneous TID WC   insulin glargine-yfgn  8 Units Subcutaneous QHS   melatonin  6 mg Oral QHS   metoprolol tartrate  50 mg Oral BID   potassium chloride  10 mEq Oral Once   rosuvastatin  10  mg Oral Daily   vitamin B-12  1,000 mcg Oral Daily   Continuous Infusions:  Procedures/Studies: ECHOCARDIOGRAM COMPLETE  Result Date: 06/13/2021    ECHOCARDIOGRAM REPORT   Patient Name:   CECIA EGGE Date of Exam: 06/13/2021 Medical Rec #:  [id number]           Height:       67.0 in Accession #:    [id number]          Weight:       164.8 lb Date of Birth:  11/06/1995           BSA:          1.863 m Patient Age:    25 years            BP:           122/78 mmHg Patient Gender: F                   HR:           83 bpm. Exam Location:  [facility] Procedure: 2D Echo, Cardiac Doppler and Color Doppler Indications:    Edema  History:        Patient has no prior history of Echocardiogram examinations.                 Risk Factors:Hypertension and Diabetes.  Sonographer:    Wenda Low Referring Phys: [id number] ASIA B McIntosh  1. Left ventricular ejection fraction, by estimation, is approximately 50%. There is low normal function. The left ventricle has no wall motion abnormalities. The left ventricular internal cavity size was normal in size. There is mild to moderate asymmetric left ventricular hypertrophy of the posterior segment. Myocardium is densely echogenic. Also somewhat promiment trabeculation at the apex and along lateral wall.  2. Right ventricular systolic function is normal. The right ventricular  size is normal. Mildly increased right ventricular wall thickness. There is normal pulmonary artery systolic pressure. The estimated right ventricular systolic pressure is 40.3 mmHg.  3. A small pericardial effusion is present. The pericardial effusion is posterior to the left ventricle and localized near the right atrium.  4. The aortic valve is tricuspid. Aortic valve regurgitation is not visualized. No aortic stenosis is present. Aortic valve mean gradient measures 4.0 mmHg.  5. The mitral valve is grossly normal. Trivial mitral valve regurgitation.  6. Left atrial size was mildly dilated. Comparison(s): No prior Echocardiogram. When clinically feasible, consider cardiac MRI for further evaluation of possible infiltrative process such as amyloidosis and to exclude ventricular noncompaction. Could also order limited echocardiogram to obtain  strain imaging. FINDINGS  Left Ventricle: Left ventricular ejection fraction, by estimation, is 50%. The left ventricle has low normal function. The left ventricle has no regional wall motion abnormalities. The left ventricular internal cavity size was normal in size. There is moderate asymmetric left ventricular hypertrophy of the inferior, inferior and inferior segments. Left ventricular diastolic parameters were normal. Right Ventricle: The right ventricular size is normal. Mildly increased right ventricular wall thickness. Right ventricular systolic function is normal. There is normal pulmonary artery systolic pressure. The tricuspid regurgitant velocity is 2.70 m/s, and with an assumed right atrial pressure of 3 mmHg, the estimated right ventricular systolic pressure is 47.4 mmHg. Left Atrium: Left atrial size was mildly dilated. Right Atrium: Right atrial size was normal in size. Pericardium: A small pericardial effusion is present. The pericardial effusion is posterior to the left ventricle and localized near  the right atrium. Mitral Valve: The mitral valve is grossly  normal. Trivial mitral valve regurgitation. MV peak gradient, 6.2 mmHg. The mean mitral valve gradient is 3.0 mmHg. Tricuspid Valve: The tricuspid valve is grossly normal. Tricuspid valve regurgitation is trivial. Aortic Valve: The aortic valve is tricuspid. Aortic valve regurgitation is not visualized. No aortic stenosis is present. Aortic valve mean gradient measures 4.0 mmHg. Aortic valve peak gradient measures 7.8 mmHg. Aortic valve area, by VTI measures 1.83 cm. Pulmonic Valve: The pulmonic valve was grossly normal. Pulmonic valve regurgitation is trivial. Aorta: The aortic root is normal in size and structure. IAS/Shunts: There is right bowing of the interatrial septum, suggestive of elevated left atrial pressure. No atrial level shunt detected by color flow Doppler.  LEFT VENTRICLE PLAX 2D LVIDd:         4.30 cm     Diastology LVIDs:         3.30 cm     LV e' medial:    9.03 cm/s LV PW:         1.40 cm     LV E/e' medial:  12.5 LV IVS:        0.90 cm     LV e' lateral:   9.57 cm/s LVOT diam:     1.90 cm     LV E/e' lateral: 11.8 LV SV:         60 LV SV Index:   32 LVOT Area:     2.84 cm  LV Volumes (MOD) LV vol d, MOD A2C: 53.2 ml LV vol d, MOD A4C: 88.1 ml LV vol s, MOD A2C: 31.0 ml LV vol s, MOD A4C: 43.8 ml LV SV MOD A2C:     22.2 ml LV SV MOD A4C:     88.1 ml LV SV MOD BP:      34.9 ml RIGHT VENTRICLE RV Basal diam:  2.95 cm RV Mid diam:    2.20 cm RV S prime:     13.10 cm/s TAPSE (M-mode): 3.5 cm LEFT ATRIUM             Index        RIGHT ATRIUM           Index LA diam:        2.90 cm 1.56 cm/m   RA Area:     17.70 cm LA Vol (A2C):   48.6 ml 26.09 ml/m  RA Volume:   40.00 ml  21.48 ml/m LA Vol (A4C):   78.6 ml 42.20 ml/m LA Biplane Vol: 64.2 ml 34.47 ml/m  AORTIC VALVE                    PULMONIC VALVE AV Area (Vmax):    1.96 cm     PV Vmax:       0.68 m/s AV Area (Vmean):   1.98 cm     PV Peak grad:  1.9 mmHg AV Area (VTI):     1.83 cm AV Vmax:           140.00 cm/s AV Vmean:          90.300  cm/s AV VTI:            0.326 m AV Peak Grad:      7.8 mmHg AV Mean Grad:      4.0 mmHg LVOT Vmax:         96.60 cm/s LVOT Vmean:        63.200 cm/s  LVOT VTI:          0.210 m LVOT/AV VTI ratio: 0.64  AORTA Ao Asc diam: 2.30 cm MITRAL VALVE                TRICUSPID VALVE MV Area (PHT): 4.36 cm     TR Peak grad:   29.2 mmHg MV Area VTI:   2.18 cm     TR Vmax:        270.00 cm/s MV Peak grad:  6.2 mmHg MV Mean grad:  3.0 mmHg     SHUNTS MV Vmax:       1.25 m/s     Systemic VTI:  0.21 m MV Vmean:      71.8 cm/s    Systemic Diam: 1.90 cm MV Decel Time: 174 msec MV E velocity: 113.00 cm/s MV A velocity: 102.00 cm/s MV E/A ratio:  1.11 Rozann Lesches MD Electronically signed by Rozann Lesches MD Signature Date/Time: 06/13/2021/2:42:35 PM    Final    US BIOPSY (KIDNEY)  Result Date: 05/20/2021 INDICATION: Nephrotic range proteinuria. Please perform ultrasound-guided renal biopsy for tissue diagnostic purposes. EXAM: ULTRASOUND GUIDED RENAL BIOPSY COMPARISON:  Renal ultrasound-06/21/2019; CT abdomen and pelvis-06/07/2019 MEDICATIONS: None. ANESTHESIA/SEDATION: Moderate (conscious) sedation was employed during this procedure as administered by the Interventional Radiology RN. A total of Versed 2 mg and Fentanyl 100 mcg was administered intravenously. Moderate Sedation Time: 14 minutes. The patient's level of consciousness and vital signs were monitored continuously by radiology nursing throughout the procedure under my direct supervision. COMPLICATIONS: SIR Level A - No therapy, no consequence. Procedure complicated by development of a tiny asymptomatic left-sided perinephric hematoma. PROCEDURE: Informed written consent was obtained from the patient after a discussion of the risks, benefits and alternatives to treatment. The patient understands and consents the procedure. A timeout was performed prior to the initiation of the procedure. Ultrasound scanning was performed of the bilateral flanks. The inferior pole of  the left kidney was selected for biopsy due to location and sonographic window. The procedure was planned. The operative site was prepped and draped in the usual sterile fashion. The overlying soft tissues were anesthetized with 1% lidocaine with epinephrine. A 17 gauge core needle biopsy device was advanced into the inferior cortex of the left kidney and 2 core biopsies were obtained under direct ultrasound guidance. Images were saved for documentation purposes. The biopsy device was removed and hemostasis was obtained with manual compression. Post procedural scanning demonstrated development of a tiny asymptomatic left-sided perinephric hematoma. A dressing was applied. The patient tolerated the procedure well without immediate post procedural complication. IMPRESSION: Technically successful ultrasound guided left renal biopsy. Procedure complicated by development of a tiny asymptomatic left-sided perinephric hematoma. Electronically Signed   By: Sandi Mariscal M.D.   On: 05/20/2021 10:00    Orson Eva, DO  [facility]  If 7PM-7AM, please contact night-coverage [email or web address] Password [email or web address] 06/17/2021, 5:22 PM   LOS: 5 days

## 2021-06-17 NOTE — Progress Notes (Signed)
Inpatient Diabetes Program Recommendations  AACE/ADA: New Consensus Statement on Inpatient Glycemic Control (2015)  Target Ranges:  Prepandial:   less than 140 mg/dL      Peak postprandial:   less than 180 mg/dL (1-2 hours)      Critically ill patients:  140 - 180 mg/dL   Lab Results  Component Value Date   GLUCAP 88 06/17/2021   HGBA1C 7.5 (H) 05/26/2021    Review of Glycemic Control  Latest Reference Range & Units 06/16/21 08:11 06/16/21 11:11 06/16/21 15:57 06/16/21 21:17 06/17/21 07:27 06/17/21 07:50 06/17/21 08:39  Glucose-Capillary 70 - 99 mg/dL 144 (H) 131 (H) 93 125 (H) 41 (LL) 50 (L) 88  (LL): Data is critically low (H): Data is abnormally high (L): Data is abnormally low  Diabetes history: DM Outpatient Diabetes medications: Semglee 14 units q hs,  Current orders for Inpatient glycemic control: Semglee 8 units qd, Novolog 0-15 units tid, 0-5 units hs   Inpatient Diabetes Program Recommendations:   -Decrease Semglee to 6 units qd -Decrease Novolog correction to 0-6 units tid, 0-5 units hs  Patient sees endocrinologist Dr. Dorris Fetch with last office visit 06/02/21. "Since she has not taken her Novolog for the last several weeks, I advised her to hold off Novolog until her next visit in 10 days. -In the mean time, she is advised to readjust her Semglee to 14 units qhs, associated with , associated with strict monitoring of glucose 4 times a day-before meals and at bedtime. - she is warned not to take insulin without proper monitoring per orders. - Adjustment parameters are given to her for hypo and hyperglycemia in writing. she is encouraged to call clinic for blood glucose levels less than 70 or above 300 mg /dl. She does not have a suitable , non insulin option to treat her diabetes."  Thank you, Nani Gasser. Oralee Rapaport, RN, MSN, CDE  Diabetes Coordinator Inpatient Glycemic Control Team Team Pager [phone] (8am-5pm) 06/17/2021 10:12 AM

## 2021-06-18 DIAGNOSIS — I872 Venous insufficiency (chronic) (peripheral): Secondary | ICD-10-CM

## 2021-06-18 DIAGNOSIS — D638 Anemia in other chronic diseases classified elsewhere: Secondary | ICD-10-CM

## 2021-06-18 LAB — URINALYSIS, COMPLETE (UACMP) WITH MICROSCOPIC
Bilirubin Urine: NEGATIVE
Glucose, UA: NEGATIVE mg/dL
Ketones, ur: NEGATIVE mg/dL
Leukocytes,Ua: NEGATIVE
Nitrite: NEGATIVE
Protein, ur: >=300 mg/dL — AB
Specific Gravity, Urine: 1.007 (ref 1.005–1.030)
pH: 5 (ref 5.0–8.0)

## 2021-06-18 LAB — RENAL FUNCTION PANEL
Albumin: 1.7 g/dL — ABNORMAL LOW (ref 3.5–5.0)
Anion gap: 8 (ref 5–15)
BUN: 51 mg/dL — ABNORMAL HIGH (ref 6–20)
CO2: 20 mmol/L — ABNORMAL LOW (ref 22–32)
Calcium: 7.9 mg/dL — ABNORMAL LOW (ref 8.9–10.3)
Chloride: 107 mmol/L (ref 98–111)
Creatinine, Ser: 3.13 mg/dL — ABNORMAL HIGH (ref 0.44–1.00)
GFR, Estimated: 20 mL/min — ABNORMAL LOW (ref >60)
Glucose, Bld: 87 mg/dL (ref 70–99)
Phosphorus: 5.5 mg/dL — ABNORMAL HIGH (ref 2.5–4.6)
Potassium: 3.6 mmol/L (ref 3.5–5.1)
Sodium: 135 mmol/L (ref 135–145)

## 2021-06-18 LAB — GLUCOSE, CAPILLARY
Glucose-Capillary: 73 mg/dL (ref 70–99)
Glucose-Capillary: 51 mg/dL — ABNORMAL LOW (ref 70–99)
Glucose-Capillary: 66 mg/dL — ABNORMAL LOW (ref 70–99)
Glucose-Capillary: 100 mg/dL — ABNORMAL HIGH (ref 70–99)
Glucose-Capillary: 156 mg/dL — ABNORMAL HIGH (ref 70–99)
Glucose-Capillary: 117 mg/dL — ABNORMAL HIGH (ref 70–99)

## 2021-06-18 MED ORDER — GLUCOSE-VITAMIN C 4-6 GM-MG PO CHEW
CHEWABLE_TABLET | ORAL | Status: AC
  Administered 2021-06-18: 2 via ORAL
  Administered 2021-06-18: 1
  Filled 2021-06-18: qty 1

## 2021-06-18 MED ORDER — ALBUMIN HUMAN 25 % IV SOLN
25.0000 g | Freq: Two times a day (BID) | INTRAVENOUS | Status: DC
  Administered 2021-06-18 – 2021-06-21 (×8): 25 g via INTRAVENOUS
  Filled 2021-06-18 (×8): qty 100

## 2021-06-18 MED ORDER — INSULIN GLARGINE-YFGN 100 UNIT/ML ~~LOC~~ SOLN
6.0000 [IU] | Freq: Every day | SUBCUTANEOUS | Status: DC
  Administered 2021-06-18 – 2021-06-22 (×5): 6 [IU] via SUBCUTANEOUS
  Filled 2021-06-18 (×6): qty 0.06

## 2021-06-18 MED ORDER — DEXTROSE 50 % IV SOLN
INTRAVENOUS | Status: AC
  Filled 2021-06-18: qty 50

## 2021-06-18 NOTE — Assessment & Plan Note (Signed)
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

## 2021-06-18 NOTE — Consult Note (Signed)
WOC Nurse Consult Note: Patient receiving care in [facility]. Consult completed remotely after review of record and images. Reason for Consult: BLE edema Wound type: Dry, scaling skin noted in photos with hemosiderin staining, all consistent with venous insufficiency. No open wounds observed Pressure Injury POA: Yes/No/NA Measurement: Wound bed: Drainage (amount, consistency, odor)  Periwound: Dressing procedure/placement/frequency: Wash legs with soap and water. Pat dry. Apply Sween Moisturizing Ointment. Beginning behind the toes and going to just below the knees, spiral wrap kerlix, then 4 inch ace wrap. Perform daily.  [facility] nurse will not follow at this time.  Please re-consult the [facility] team if needed.  Val Riles, RN, MSN, CWOCN, CNS-BC, pager [phone]

## 2021-06-18 NOTE — Progress Notes (Signed)
Subjective:   Really no overall delta.    Objective Vital signs in last 24 hours: Vitals:   06/17/21 0625 06/17/21 1401 06/17/21 2100 06/18/21 0526  BP:  (!) 160/104 (!) 142/91 128/82  Pulse:  92 99 84  Resp:  15  17  Temp:  98.5 F (36.9 C) (!) 100.9 F (38.3 C) 98.7 F (37.1 C)  TempSrc:  Axillary Oral   SpO2:  99% 97% 99%  Weight: 73.4 kg     Height:       Weight change:   Intake/Output Summary (Last 24 hours) at 06/18/2021 0959 Last data filed at 06/18/2021 2130 Gross per 24 hour  Intake --  Output 1000 ml  Net -1000 ml    Assessment/ Plan: Pt is a 26 y.o. yo female with biopsy proven diabetic kidney disease- nephrotic syndrome who was admitted on 06/12/2021 with volume overload    Assessment/Plan: 1. Volume overload-  due to her nephrotic syndrome and CKD-  difficult problem to treat.  UOP better on q 6 IV lasix.  BP is improved with diuresis and beta blocker-  her ACE is on hold due to AKI-  Mom says her normal weight is in the 130's-  still 156-  continue high dose IV diuresis- expect will need 24-48 ish more hrs.  ? If UNNA boots will help.  Will add albumin today to see if that helps too.   2. 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  No change for now 3. Anemia- due to CKD-   not helping the third spacing-  on IV iron and ESA -  also given 2 units of blood this admission.  Hgb 9.6 4. Metabolic acidosis-  due to CKD-  hesitate to treat with oral  bicarb given sodium load, improving    Lindsey Murillo    Labs: Basic Metabolic Panel: Recent Labs  Lab 06/16/21 0923 06/17/21 0604 06/18/21 0619  NA 136 136 135  K 3.7 3.4* 3.6  CL 109 110 107  CO2 17* 19* 20*  GLUCOSE 123* 54* 87  BUN 51* 51* 51*  CREATININE 3.00* 2.93* 3.13*  CALCIUM 8.1* 8.2* 7.9*  PHOS 6.1* 6.0* 5.5*   Liver Function Tests: Recent Labs  Lab 06/12/21 2102 06/13/21 0508 06/14/21 0403  06/16/21 0923 06/17/21 0604 06/18/21 0619  AST 28 19  --   --   --   --   ALT 30 22  --   --   --   --   ALKPHOS 179* 138*  --   --   --   --   BILITOT 0.2* 0.2*  --   --   --   --   PROT 6.0* 4.9*  --   --   --   --   ALBUMIN 1.9* 1.6*   < > 2.0* 1.8* 1.7*   < > = values in this interval not displayed.   No results for input(s): LIPASE, AMYLASE in the last 168 hours. No results for input(s): AMMONIA in the last 168 hours. CBC: Recent Labs  Lab 06/12/21 2102 06/13/21 0508 06/14/21 0403 06/16/21 0923  WBC 11.5* 9.3 8.4 9.0  NEUTROABS 7.8* 6.6  --   --   HGB 8.1* 6.4* 8.6* 9.6*  HCT 25.8* 20.6* 25.6* 28.7*  MCV 92.1 88.8 89.5 88.3  PLT 236 199 187 202   Cardiac Enzymes: No results for input(s): CKTOTAL, CKMB,  CKMBINDEX, TROPONINI in the last 168 hours. CBG: Recent Labs  Lab 06/17/21 0839 06/17/21 1100 06/17/21 1635 06/17/21 2151 06/18/21 0732  GLUCAP 88 95 108* 136* 73    Iron Studies: No results for input(s): IRON, TIBC, TRANSFERRIN, FERRITIN in the last 72 hours. Studies/Results: No results found. Medications: Infusions:    Scheduled Medications:  darbepoetin (ARANESP) injection - NON-DIALYSIS  60 mcg Subcutaneous Q Sun-1800   feeding supplement  237 mL Oral BID BM   furosemide  80 mg Intravenous Q6H   heparin  5,000 Units Subcutaneous Q8H   insulin aspart  0-5 Units Subcutaneous QHS   insulin aspart  0-6 Units Subcutaneous TID WC   insulin glargine-yfgn  8 Units Subcutaneous QHS   melatonin  6 mg Oral QHS   metoprolol tartrate  50 mg Oral BID   rosuvastatin  10 mg Oral Daily   vitamin B-12  1,000 mcg Oral Daily    have reviewed scheduled and prn medications.  Physical Exam: General: eating breakfast  Heart: RRR Lungs: dec BS at bases Abdomen: obese, soft, non tender Extremities: discolored legs with pitting edema-  evidence of skin breakdown and weeping     06/18/2021,9:59 AM  LOS: 6 days

## 2021-06-18 NOTE — Progress Notes (Signed)
Inpatient Diabetes Program Recommendations  AACE/ADA: New Consensus Statement on Inpatient Glycemic Control (2015)  Target Ranges:  Prepandial:   less than 140 mg/dL      Peak postprandial:   less than 180 mg/dL (1-2 hours)      Critically ill patients:  140 - 180 mg/dL   Lab Results  Component Value Date   GLUCAP 51 (L) 06/18/2021   HGBA1C 7.5 (H) 05/26/2021    Review of Glycemic Control  Latest Reference Range & Units 06/17/21 07:27 06/17/21 07:50 06/17/21 08:39 06/17/21 11:00 06/17/21 16:35 06/17/21 21:51 06/18/21 07:32 06/18/21 11:08  Glucose-Capillary 70 - 99 mg/dL 41 (LL) 50 (L) 88 95 108 (H) 136 (H) 73 51 (L)  (LL): Data is critically low (L): Data is abnormally low (H): Data is abnormally high  Diabetes history: DM Outpatient Diabetes medications: Semglee 14 units q hs,  Current orders for Inpatient glycemic control: Semglee 8 units qd, Novolog 0-9 units tid, 0-5 units hs    Inpatient Diabetes Program Recommendations:   -Decrease Semglee to 6 units qd  Thank you, Nani Gasser. Daeshaun Specht, RN, MSN, CDE  Diabetes Coordinator Inpatient Glycemic Control Team Team Pager [phone] (8am-5pm) 06/18/2021 11:42 AM

## 2021-06-18 NOTE — Progress Notes (Signed)
PROGRESS NOTE  Lindsey Murillo YIF:[id number] DOB: 02-Dec-1995 DOA: 06/12/2021 PCP: Coral Spikes, DO  Brief History:  26 y.o. female with medical history significant of type 1 diabetes, autoimmune thyroiditis, diabetic neuropathy,  nephrotic syndrome, hypertension, asthma, and more presents ED with a chief complaint of BL leg weeping and edema x 1 week.   She notices blisters, come up, expand, and then burst.  She has no associated pain.  She reports that her legs are very heavy with fluid as well.  She is becoming more fatigued from trying to ambulate with this fluid overload.  Patient reports she also has dyspnea on exertion. She is not sure if she has orthopnea because she does not attempt to lay flat due to chronic abdominal pain.  Patient denies any f/c, n/v chest pain, palpitations.  Patient is following with nephrology.  In February 2021 patient had a vancomycin induced kidney injury, and she has been following with nephrology since that time.  She reports her last visit was last week.  She reports no medications were changed, and her primary complaint there was the leg swelling.  Patient reports the leg swelling has been going on since her hospitalization in February 2021 but has been gradually worsening over an unknown period.  In February 2021 she had negative DVT studies bilaterally.  The conclusion was that patient's third spacing of fluid was secondary to hypoalbuminemia.  She was started on protein shakes.  Mother at bedside reports she has not been using protein shakes.  Patient reports worsen lower extremity swelling.  Nephrology was consulted to assist with management.  IV lasix was started and titrated to 80 mg IV q 6 hours with slow improvement.  IV albumin also added as well as protein supplementation.  Wound care consult was placed for LE edema changes>>Wash legs with soap and water. Pat dry. Apply Sween Moisturizing Ointment. Beginning behind the toes and going to just below  the knees, spiral wrap kerlix, then 4 inch ace wrap. Perform daily.    Assessment and Plan: * Acute renal failure superimposed on stage 4 chronic kidney disease ([place])- (present on admission) -Last creatinine around  2.3 -now new baseline 2.8-3.0 -remains fluid overloaded -continue IV lasix 80 mg q 6 hrs -setting of decreased effective arterial blood volume due to nephrotic syndrome and significant third spacing with ongoing home diuresis/ACE inhibitor management. -appreciate nephrology>>add IV albumin -hold ACEi -serum creatinine hold stable with IV lasix   Hypokalemia repleted judiciously in pt with CKD4  Venous stasis dermatitis See pics of legs Not sure compression dressings will help Significant lichenification -consult wound care nurse>>appreciate recommendations>> Wash legs with soap and water. Pat dry. Apply Sween Moisturizing Ointment. Beginning behind the toes and going to just below the knees, spiral wrap kerlix, then 4 inch ace wrap. Perform daily.   Anemia of chronic disease- (present on admission) -Hemoglobin down to 6.4 at time of admission -2 units PRBCs has been transfused this admission -Hgb remains stable after transfusion -IV iron and ESA therapy as per nephrology  -No signs of overt bleeding.  Peripheral edema Continue SCDs Encourage elevation of legs Treating with albumin and lasix Continue to monitor  Hypoalbuminemia- (present on admission) -Patient has history of nephrotic syndrome but also does not maintain nutrient dense p.o. intake -Advised to use Nepro. -Restarted IV albumin  GERD (gastroesophageal reflux disease)- (present on admission) -Continue PPI.  Uncontrolled type 1 diabetes mellitus with hyperglycemia ([place])- (present  on admission) -Continue current use of sliding scale insulin and basal insulin while inpatient. -decrease to sliding scale for renal patient due to hypoglycemia -decrease Semglee dose   Essential hypertension, benign-  (present on admission) -d/c ACEi -Continue metoprolol. -anticipate improvement with diuressis         Status is: Inpatient Remains inpatient appropriate because: severity of illness;  remains fluid overloaded requiring IV lasix.              Family Communication:   mother updated at bedside 2/8  Consultants:  renal  Code Status:  FULL   DVT Prophylaxis:  [place] Heparin   Procedures: As Listed in Progress Note Above  Antibiotics: None      Subjective: Patient denies fevers, chills, headache, chest pain, dyspnea, nausea, vomiting, diarrhea, abdominal pain, dysuria, hematuria, hematochezia, and melena. Feels legs are less heavy  Objective: Vitals:   06/17/21 1401 06/17/21 2100 06/18/21 0526 06/18/21 1027  BP: (!) 160/104 (!) 142/91 128/82 (!) 167/99  Pulse: 92 99 84 86  Resp: 15  17   Temp: 98.5 F (36.9 C) (!) 100.9 F (38.3 C) 98.7 F (37.1 C)   TempSrc: Axillary Oral    SpO2: 99% 97% 99%   Weight:      Height:        Intake/Output Summary (Last 24 hours) at 06/18/2021 1402 Last data filed at 06/18/2021 2993 Gross per 24 hour  Intake --  Output 1000 ml  Net -1000 ml   Weight change:  Exam:  General:  Pt is alert, follows commands appropriately, not in acute distress HEENT: No icterus, No thrush, No neck mass, [place]/AT Cardiovascular: RRR, S1/S2, no rubs, no gallops Respiratory: bibasilar crackles. No wheeze Abdomen: Soft/+BS, non tender, non distended, no guarding Extremities: 2 + LE edema, No lymphangitis, No petechiae, No rashes, no synovitis+lichenification   Data Reviewed: I have personally reviewed following labs and imaging studies Basic Metabolic Panel: Recent Labs  Lab 06/13/21 0508 06/14/21 0403 06/15/21 0512 06/16/21 0923 06/17/21 0604 06/18/21 0619  NA 140 138 137 136 136 135  K 3.9 3.9 3.7 3.7 3.4* 3.6  CL 114* 112* 112* 109 110 107  CO2 19* 18* 18* 17* 19* 20*  GLUCOSE 149* 86 101* 123* 54* 87  BUN 48* 48* 50* 51* 51*  51*  CREATININE 2.90* 2.81* 2.85* 3.00* 2.93* 3.13*  CALCIUM 8.2* 8.1* 8.0* 8.1* 8.2* 7.9*  MG 1.8  --  1.8  --   --   --   PHOS  --  6.3* 6.5* 6.1* 6.0* 5.5*   Liver Function Tests: Recent Labs  Lab 06/12/21 2102 06/13/21 0508 06/14/21 0403 06/15/21 0512 06/16/21 0923 06/17/21 0604 06/18/21 0619  AST 28 19  --   --   --   --   --   ALT 30 22  --   --   --   --   --   ALKPHOS 179* 138*  --   --   --   --   --   BILITOT 0.2* 0.2*  --   --   --   --   --   PROT 6.0* 4.9*  --   --   --   --   --   ALBUMIN 1.9* 1.6* 1.9* 1.7* 2.0* 1.8* 1.7*   No results for input(s): LIPASE, AMYLASE in the last 168 hours. No results for input(s): AMMONIA in the last 168 hours. Coagulation Profile: No results for input(s): INR, PROTIME in the last 168  hours. CBC: Recent Labs  Lab 06/12/21 2102 06/13/21 0508 06/14/21 0403 06/16/21 0923  WBC 11.5* 9.3 8.4 9.0  NEUTROABS 7.8* 6.6  --   --   HGB 8.1* 6.4* 8.6* 9.6*  HCT 25.8* 20.6* 25.6* 28.7*  MCV 92.1 88.8 89.5 88.3  PLT 236 199 187 202   Cardiac Enzymes: No results for input(s): CKTOTAL, CKMB, CKMBINDEX, TROPONINI in the last 168 hours. BNP: Invalid input(s): POCBNP CBG: Recent Labs  Lab 06/17/21 2151 06/18/21 0732 06/18/21 1108 06/18/21 1213 06/18/21 1336  GLUCAP 136* 73 51* 66* 100*   HbA1C: No results for input(s): HGBA1C in the last 72 hours. Urine analysis:    Component Value Date/Time   COLORURINE YELLOW 06/13/2021 1607   APPEARANCEUR CLEAR 06/13/2021 1607   LABSPEC 1.025 06/13/2021 1607   PHURINE 5.5 06/13/2021 1607   GLUCOSEU 100 (A) 06/13/2021 1607   HGBUR SMALL (A) 06/13/2021 1607   BILIRUBINUR NEGATIVE 06/13/2021 1607   KETONESUR NEGATIVE 06/13/2021 1607   PROTEINUR 100 (A) 06/13/2021 1607   UROBILINOGEN 0.2 01/07/2015 1723   NITRITE NEGATIVE 06/13/2021 1607   LEUKOCYTESUR NEGATIVE 06/13/2021 1607   Sepsis Labs: @LABRCNTIP (procalcitonin:4,lacticidven:4) ) Recent Results (from the past 240 hour(s))   Resp Panel by RT-PCR (Flu A&B, Covid) Nasopharyngeal Swab     Status: None   Collection Time: 06/12/21 10:47 PM   Specimen: Nasopharyngeal Swab; Nasopharyngeal(NP) swabs in vial transport medium  Result Value Ref Range Status   SARS Coronavirus 2 by RT PCR NEGATIVE NEGATIVE Final    Comment: (NOTE) SARS-CoV-2 target nucleic acids are NOT DETECTED.  The SARS-CoV-2 RNA is generally detectable in upper respiratory specimens during the acute phase of infection. The lowest concentration of SARS-CoV-2 viral copies this assay can detect is 138 copies/mL. A negative result does not preclude SARS-Cov-2 infection and should not be used as the sole basis for treatment or other patient management decisions. A negative result may occur with  improper specimen collection/handling, submission of specimen other than nasopharyngeal swab, presence of viral mutation(s) within the areas targeted by this assay, and inadequate number of viral copies(<138 copies/mL). A negative result must be combined with clinical observations, patient history, and epidemiological information. The expected result is Negative.  Fact Sheet for Patients:  EntrepreneurPulse.com.au  Fact Sheet for Healthcare Providers:  IncredibleEmployment.be  This test is no t yet approved or cleared by the [place] FDA and  has been authorized for detection and/or diagnosis of SARS-CoV-2 by FDA under an Emergency Use Authorization (EUA). This EUA will remain  in effect (meaning this test can be used) for the duration of the COVID-19 declaration under Section 564(b)(1) of the Act, 21 U.S.C.section 360bbb-3(b)(1), unless the authorization is terminated  or revoked sooner.       Influenza A by PCR NEGATIVE NEGATIVE Final   Influenza B by PCR NEGATIVE NEGATIVE Final    Comment: (NOTE) The Xpert Xpress SARS-CoV-2/FLU/RSV plus assay is intended as an aid in the diagnosis of influenza from  Nasopharyngeal swab specimens and should not be used as a sole basis for treatment. Nasal washings and aspirates are unacceptable for Xpert Xpress SARS-CoV-2/FLU/RSV testing.  Fact Sheet for Patients: EntrepreneurPulse.com.au  Fact Sheet for Healthcare Providers: IncredibleEmployment.be  This test is not yet approved or cleared by the [place] FDA and has been authorized for detection and/or diagnosis of SARS-CoV-2 by FDA under an Emergency Use Authorization (EUA). This EUA will remain in effect (meaning this test can be used) for the duration of the COVID-19 declaration  under Section 564(b)(1) of the Act, 21 U.S.C. section 360bbb-3(b)(1), unless the authorization is terminated or revoked.  Performed at [facility], [address], [place], [place] [address]      Scheduled Meds:  darbepoetin (ARANESP) injection - NON-DIALYSIS  60 mcg Subcutaneous Q Sun-1800   dextrose       feeding supplement  237 mL Oral BID BM   furosemide  80 mg Intravenous Q6H   heparin  5,000 Units Subcutaneous Q8H   insulin aspart  0-5 Units Subcutaneous QHS   insulin aspart  0-6 Units Subcutaneous TID WC   insulin glargine-yfgn  6 Units Subcutaneous QHS   melatonin  6 mg Oral QHS   metoprolol tartrate  50 mg Oral BID   rosuvastatin  10 mg Oral Daily   vitamin B-12  1,000 mcg Oral Daily   Continuous Infusions:  albumin human 25 g (06/18/21 1131)    Procedures/Studies: ECHOCARDIOGRAM COMPLETE  Result Date: 06/13/2021    ECHOCARDIOGRAM REPORT   Patient Name:   PHILIS DOKE Date of Exam: 06/13/2021 Medical Rec #:  [id number]           Height:       67.0 in Accession #:    [id number]          Weight:       164.8 lb Date of Birth:  [birth year]           BSA:          1.863 m Patient Age:    25 years            BP:           122/78 mmHg Patient Gender: F                   HR:           83 bpm. Exam Location:  [facility] Procedure: 2D Echo, Cardiac Doppler and Color  Doppler Indications:    Edema  History:        Patient has no prior history of Echocardiogram examinations.                 Risk Factors:Hypertension and Diabetes.  Sonographer:    Wenda Low Referring Phys: [id number] ASIA B [place]  1. Left ventricular ejection fraction, by estimation, is approximately 50%. There is low normal function. The left ventricle has no wall motion abnormalities. The left ventricular internal cavity size was normal in size. There is mild to moderate asymmetric left ventricular hypertrophy of the posterior segment. Myocardium is densely echogenic. Also somewhat promiment trabeculation at the apex and along lateral wall.  2. Right ventricular systolic function is normal. The right ventricular size is normal. Mildly increased right ventricular wall thickness. There is normal pulmonary artery systolic pressure. The estimated right ventricular systolic pressure is 41.9 mmHg.  3. A small pericardial effusion is present. The pericardial effusion is posterior to the left ventricle and localized near the right atrium.  4. The aortic valve is tricuspid. Aortic valve regurgitation is not visualized. No aortic stenosis is present. Aortic valve mean gradient measures 4.0 mmHg.  5. The mitral valve is grossly normal. Trivial mitral valve regurgitation.  6. Left atrial size was mildly dilated. Comparison(s): No prior Echocardiogram. When clinically feasible, consider cardiac MRI for further evaluation of possible infiltrative process such as amyloidosis and to exclude ventricular noncompaction. Could also order limited echocardiogram to obtain  strain imaging. FINDINGS  Left Ventricle: Left ventricular ejection  fraction, by estimation, is 50%. The left ventricle has low normal function. The left ventricle has no regional wall motion abnormalities. The left ventricular internal cavity size was normal in size. There is moderate asymmetric left ventricular hypertrophy of the inferior,  inferior and inferior segments. Left ventricular diastolic parameters were normal. Right Ventricle: The right ventricular size is normal. Mildly increased right ventricular wall thickness. Right ventricular systolic function is normal. There is normal pulmonary artery systolic pressure. The tricuspid regurgitant velocity is 2.70 m/s, and with an assumed right atrial pressure of 3 mmHg, the estimated right ventricular systolic pressure is 98.1 mmHg. Left Atrium: Left atrial size was mildly dilated. Right Atrium: Right atrial size was normal in size. Pericardium: A small pericardial effusion is present. The pericardial effusion is posterior to the left ventricle and localized near the right atrium. Mitral Valve: The mitral valve is grossly normal. Trivial mitral valve regurgitation. MV peak gradient, 6.2 mmHg. The mean mitral valve gradient is 3.0 mmHg. Tricuspid Valve: The tricuspid valve is grossly normal. Tricuspid valve regurgitation is trivial. Aortic Valve: The aortic valve is tricuspid. Aortic valve regurgitation is not visualized. No aortic stenosis is present. Aortic valve mean gradient measures 4.0 mmHg. Aortic valve peak gradient measures 7.8 mmHg. Aortic valve area, by VTI measures 1.83 cm. Pulmonic Valve: The pulmonic valve was grossly normal. Pulmonic valve regurgitation is trivial. Aorta: The aortic root is normal in size and structure. IAS/Shunts: There is right bowing of the interatrial septum, suggestive of elevated left atrial pressure. No atrial level shunt detected by color flow Doppler.  LEFT VENTRICLE PLAX 2D LVIDd:         4.30 cm     Diastology LVIDs:         3.30 cm     LV e' medial:    9.03 cm/s LV PW:         1.40 cm     LV E/e' medial:  12.5 LV IVS:        0.90 cm     LV e' lateral:   9.57 cm/s LVOT diam:     1.90 cm     LV E/e' lateral: 11.8 LV SV:         60 LV SV Index:   32 LVOT Area:     2.84 cm  LV Volumes (MOD) LV vol d, MOD A2C: 53.2 ml LV vol d, MOD A4C: 88.1 ml LV vol s, MOD  A2C: 31.0 ml LV vol s, MOD A4C: 43.8 ml LV SV MOD A2C:     22.2 ml LV SV MOD A4C:     88.1 ml LV SV MOD BP:      34.9 ml RIGHT VENTRICLE RV Basal diam:  2.95 cm RV Mid diam:    2.20 cm RV S prime:     13.10 cm/s TAPSE (M-mode): 3.5 cm LEFT ATRIUM             Index        RIGHT ATRIUM           Index LA diam:        2.90 cm 1.56 cm/m   RA Area:     17.70 cm LA Vol (A2C):   48.6 ml 26.09 ml/m  RA Volume:   40.00 ml  21.48 ml/m LA Vol (A4C):   78.6 ml 42.20 ml/m LA Biplane Vol: 64.2 ml 34.47 ml/m  AORTIC VALVE  PULMONIC VALVE AV Area (Vmax):    1.96 cm     PV Vmax:       0.68 m/s AV Area (Vmean):   1.98 cm     PV Peak grad:  1.9 mmHg AV Area (VTI):     1.83 cm AV Vmax:           140.00 cm/s AV Vmean:          90.300 cm/s AV VTI:            0.326 m AV Peak Grad:      7.8 mmHg AV Mean Grad:      4.0 mmHg LVOT Vmax:         96.60 cm/s LVOT Vmean:        63.200 cm/s LVOT VTI:          0.210 m LVOT/AV VTI ratio: 0.64  AORTA Ao Asc diam: 2.30 cm MITRAL VALVE                TRICUSPID VALVE MV Area (PHT): 4.36 cm     TR Peak grad:   29.2 mmHg MV Area VTI:   2.18 cm     TR Vmax:        270.00 cm/s MV Peak grad:  6.2 mmHg MV Mean grad:  3.0 mmHg     SHUNTS MV Vmax:       1.25 m/s     Systemic VTI:  0.21 m MV Vmean:      71.8 cm/s    Systemic Diam: 1.90 cm MV Decel Time: 174 msec MV E velocity: 113.00 cm/s MV A velocity: 102.00 cm/s MV E/A ratio:  1.11 Rozann Lesches MD Electronically signed by Rozann Lesches MD Signature Date/Time: 06/13/2021/2:42:35 PM    Final    US BIOPSY (KIDNEY)  Result Date: 05/20/2021 INDICATION: Nephrotic range proteinuria. Please perform ultrasound-guided renal biopsy for tissue diagnostic purposes. EXAM: ULTRASOUND GUIDED RENAL BIOPSY COMPARISON:  Renal ultrasound-06/21/2019; CT abdomen and pelvis-06/07/2019 MEDICATIONS: None. ANESTHESIA/SEDATION: Moderate (conscious) sedation was employed during this procedure as administered by the Interventional Radiology RN. A total  of Versed 2 mg and Fentanyl 100 mcg was administered intravenously. Moderate Sedation Time: 14 minutes. The patient's level of consciousness and vital signs were monitored continuously by radiology nursing throughout the procedure under my direct supervision. COMPLICATIONS: SIR Level A - No therapy, no consequence. Procedure complicated by development of a tiny asymptomatic left-sided perinephric hematoma. PROCEDURE: Informed written consent was obtained from the patient after a discussion of the risks, benefits and alternatives to treatment. The patient understands and consents the procedure. A timeout was performed prior to the initiation of the procedure. Ultrasound scanning was performed of the bilateral flanks. The inferior pole of the left kidney was selected for biopsy due to location and sonographic window. The procedure was planned. The operative site was prepped and draped in the usual sterile fashion. The overlying soft tissues were anesthetized with 1% lidocaine with epinephrine. A 17 gauge core needle biopsy device was advanced into the inferior cortex of the left kidney and 2 core biopsies were obtained under direct ultrasound guidance. Images were saved for documentation purposes. The biopsy device was removed and hemostasis was obtained with manual compression. Post procedural scanning demonstrated development of a tiny asymptomatic left-sided perinephric hematoma. A dressing was applied. The patient tolerated the procedure well without immediate post procedural complication. IMPRESSION: Technically successful ultrasound guided left renal biopsy. Procedure complicated by development of a tiny asymptomatic left-sided perinephric hematoma.  Electronically Signed   By: Sandi Mariscal M.D.   On: 05/20/2021 10:00    Orson Eva, DO  [facility]  If 7PM-7AM, please contact night-coverage [email or web address] Password [email or web address] 06/18/2021, 2:02 PM   LOS: 6 days

## 2021-06-19 ENCOUNTER — Telehealth: Payer: Self-pay | Admitting: Gastroenterology

## 2021-06-19 LAB — CBC
HCT: 25.8 % — ABNORMAL LOW (ref 36.0–46.0)
Hemoglobin: 8.3 g/dL — ABNORMAL LOW (ref 12.0–15.0)
MCH: 28.4 pg (ref 26.0–34.0)
MCHC: 32.2 g/dL (ref 30.0–36.0)
MCV: 88.4 fL (ref 80.0–100.0)
Platelets: 166 10*3/uL (ref 150–400)
RBC: 2.92 MIL/uL — ABNORMAL LOW (ref 3.87–5.11)
RDW: 14 % (ref 11.5–15.5)
WBC: 10.3 10*3/uL (ref 4.0–10.5)
nRBC: 0.2 % (ref 0.0–0.2)

## 2021-06-19 LAB — RENAL FUNCTION PANEL
Albumin: 2.3 g/dL — ABNORMAL LOW (ref 3.5–5.0)
Anion gap: 8 (ref 5–15)
BUN: 51 mg/dL — ABNORMAL HIGH (ref 6–20)
CO2: 21 mmol/L — ABNORMAL LOW (ref 22–32)
Calcium: 8.4 mg/dL — ABNORMAL LOW (ref 8.9–10.3)
Chloride: 107 mmol/L (ref 98–111)
Creatinine, Ser: 3.02 mg/dL — ABNORMAL HIGH (ref 0.44–1.00)
GFR, Estimated: 21 mL/min — ABNORMAL LOW (ref >60)
Glucose, Bld: 90 mg/dL (ref 70–99)
Phosphorus: 5.2 mg/dL — ABNORMAL HIGH (ref 2.5–4.6)
Potassium: 3.4 mmol/L — ABNORMAL LOW (ref 3.5–5.1)
Sodium: 136 mmol/L (ref 135–145)

## 2021-06-19 LAB — GLUCOSE, CAPILLARY
Glucose-Capillary: 79 mg/dL (ref 70–99)
Glucose-Capillary: 97 mg/dL (ref 70–99)
Glucose-Capillary: 151 mg/dL — ABNORMAL HIGH (ref 70–99)
Glucose-Capillary: 232 mg/dL — ABNORMAL HIGH (ref 70–99)

## 2021-06-19 MED ORDER — FUROSEMIDE 10 MG/ML IJ SOLN
100.0000 mg | Freq: Four times a day (QID) | INTRAVENOUS | Status: DC
  Filled 2021-06-19 (×5): qty 10

## 2021-06-19 MED ORDER — FUROSEMIDE 10 MG/ML IJ SOLN
80.0000 mg | Freq: Four times a day (QID) | INTRAMUSCULAR | Status: DC
  Administered 2021-06-19 – 2021-06-22 (×11): 80 mg via INTRAVENOUS
  Filled 2021-06-19 (×11): qty 8

## 2021-06-19 NOTE — Progress Notes (Signed)
PROGRESS NOTE  Lindsey Murillo IYM:[id number] DOB: [birth year] DOA: 06/12/2021 PCP: Coral Spikes, DO  Brief History:  26 y.o. female with medical history significant of type 1 diabetes, autoimmune thyroiditis, diabetic neuropathy,  nephrotic syndrome, hypertension, asthma, and more presents ED with a chief complaint of BL leg weeping and edema x 1 week.   She notices blisters, come up, expand, and then burst.  She has no associated pain.  She reports that her legs are very heavy with fluid as well.  She is becoming more fatigued from trying to ambulate with this fluid overload.  Patient reports she also has dyspnea on exertion. She is not sure if she has orthopnea because she does not attempt to lay flat due to chronic abdominal pain.  Patient denies any f/c, n/v chest pain, palpitations.  Patient is following with nephrology.  In February 2021 patient had a vancomycin induced kidney injury, and she has been following with nephrology since that time.  She reports her last visit was last week.  She reports no medications were changed, and her primary complaint there was the leg swelling.  Patient reports the leg swelling has been going on since her hospitalization in February 2021 but has been gradually worsening over an unknown period.  In February 2021 she had negative DVT studies bilaterally.  The conclusion was that patient's third spacing of fluid was secondary to hypoalbuminemia.  She was started on protein shakes.  Mother at bedside reports she has not been using protein shakes.  Patient reports worsen lower extremity swelling.  Nephrology was consulted to assist with management.  IV lasix was started and titrated to 80 mg IV q 6 hours with slow improvement.  IV albumin also added as well as protein supplementation.  Wound care consult was placed for LE edema changes>>Wash legs with soap and water. Pat dry. Apply Sween Moisturizing Ointment. Beginning behind the toes and going to just below  the knees, spiral wrap kerlix, then 4 inch ace wrap. Perform daily.   Assessment/Plan:   Principal Problem:   Acute renal failure superimposed on stage 4 chronic kidney disease ([name]) Active Problems:   Essential hypertension, benign   Uncontrolled type 1 diabetes mellitus with hyperglycemia ([name])   GERD (gastroesophageal reflux disease)   Hypoalbuminemia   Anemia of chronic disease   Venous stasis dermatitis   Hypokalemia  Assessment and Plan: * Acute renal failure superimposed on stage 4 chronic kidney disease ([place])- (present on admission) -Last creatinine around  2.3 -now new baseline 2.8-3.0 -remains fluid overloaded -continue IV lasix 80 mg q 6 hrs -setting of decreased effective arterial blood volume due to nephrotic syndrome and significant third spacing with ongoing home diuresis/ACE inhibitor management. -appreciate nephrology>>add IV albumin bid -hold ACEi -serum creatinine holding stable with IV lasix -2/10 standing weight 139 lbs   Hypokalemia repleted judiciously in pt with CKD4  Venous stasis dermatitis See pics of legs Not sure compression dressings will help Significant lichenification -consult wound care nurse>>appreciate recommendations>> Wash legs with soap and water. Pat dry. Apply Sween Moisturizing Ointment. Beginning behind the toes and going to just below the knees, spiral wrap kerlix, then 4 inch ace wrap. Perform daily.   Anemia of chronic disease- (present on admission) -Hemoglobin down to 6.4 at time of admission -2 units PRBCs has been transfused this admission -Hgb remains stable after transfusion -IV iron and ESA therapy as per nephrology  -No signs of overt bleeding.  Peripheral edema Continue SCDs Encourage elevation of legs Treating with albumin and lasix Continue to monitor  Hypoalbuminemia- (present on admission) -Patient has history of nephrotic syndrome but also does not maintain nutrient dense p.o. intake -Advised to use  N[place]ro. -Restarted IV albumin  GERD (gastroesophageal reflux disease)- (present on admission) -Continue PPI.  Uncontrolled type 1 diabetes mellitus with hyperglycemia ([place])- (present on admission) -Continue current use of sliding scale insulin and basal insulin while inpatient. -decrease to sliding scale for renal patient due to hypoglycemia -decrease Semglee dose   Essential hypertension, benign- (present on admission) -d/c ACEi -Continue metoprolol. -anticipate improvement with diuressis       Status is: Inpatient Remains inpatient appropriate because: severity of illness;  remains fluid overloaded requiring IV lasix.  remains fluid overloaded                       Family Communication:   mother updated at bedside 2/9   Consultants:  renal   Code Status:  FULL    DVT Prophylaxis:  [place] Heparin     Procedures: As Listed in Progress Note Above   Antibiotics: None       Subjective: Patient denies fevers, chills, headache, chest pain, dyspnea, nausea, vomiting, diarrhea, abdominal pain, dysuria, hematuria, hematochezia, and melena.   Objective: Vitals:   06/19/21 0433 06/19/21 0500 06/19/21 0955 06/19/21 1207  BP: (!) 149/86  (!) 167/106 (!) 150/94  Pulse: 87  90 81  Resp: 20   18  Temp: 98.6 F (37 C)   99.1 F (37.3 C)  TempSrc: Oral   Oral  SpO2: 99%   99%  Weight:  74 kg    Height:        Intake/Output Summary (Last 24 hours) at 06/19/2021 1704 Last data filed at 06/19/2021 1300 Gross per 24 hour  Intake 460 ml  Output 2100 ml  Net -1640 ml   Weight change:  Exam:  General:  Pt is alert, follows commands appropriately, not in acute distress HEENT: No icterus, No thrush, No neck mass, /AT Cardiovascular: RRR, S1/S2, no rubs, no gallops Respiratory: bibasilar crackles. No wheeze Abdomen: Soft/+BS, non tender, non distended, no guarding Extremities: 2 +LE edema, No lymphangitis, No petechiae, No rashes, no synovitis   Data  Reviewed: I have personally reviewed following labs and imaging studies Basic Metabolic Panel: Recent Labs  Lab 06/13/21 0508 06/14/21 0403 06/15/21 0512 06/16/21 0923 06/17/21 0604 06/18/21 0619 06/19/21 0419  NA 140   < > 137 136 136 135 136  K 3.9   < > 3.7 3.7 3.4* 3.6 3.4*  CL 114*   < > 112* 109 110 107 107  CO2 19*   < > 18* 17* 19* 20* 21*  GLUCOSE 149*   < > 101* 123* 54* 87 90  BUN 48*   < > 50* 51* 51* 51* 51*  CREATININE 2.90*   < > 2.85* 3.00* 2.93* 3.13* 3.02*  CALCIUM 8.2*   < > 8.0* 8.1* 8.2* 7.9* 8.4*  MG 1.8  --  1.8  --   --   --   --   PHOS  --    < > 6.5* 6.1* 6.0* 5.5* 5.2*   < > = values in this interval not displayed.   Liver Function Tests: Recent Labs  Lab 06/12/21 2102 06/13/21 8527 06/14/21 0403 06/15/21 7824 06/16/21 2353 06/17/21 0604 06/18/21 0619 06/19/21 0419  AST 28 19  --   --   --   --   --   --  ALT 30 22  --   --   --   --   --   --   ALKPHOS 179* 138*  --   --   --   --   --   --   BILITOT 0.2* 0.2*  --   --   --   --   --   --   PROT 6.0* 4.9*  --   --   --   --   --   --   ALBUMIN 1.9* 1.6*   < > 1.7* 2.0* 1.8* 1.7* 2.3*   < > = values in this interval not displayed.   No results for input(s): LIPASE, AMYLASE in the last 168 hours. No results for input(s): AMMONIA in the last 168 hours. Coagulation Profile: No results for input(s): INR, PROTIME in the last 168 hours. CBC: Recent Labs  Lab 06/12/21 2102 06/13/21 0508 06/14/21 0403 06/16/21 0923 06/19/21 0419  WBC 11.5* 9.3 8.4 9.0 10.3  NEUTROABS 7.8* 6.6  --   --   --   HGB 8.1* 6.4* 8.6* 9.6* 8.3*  HCT 25.8* 20.6* 25.6* 28.7* 25.8*  MCV 92.1 88.8 89.5 88.3 88.4  PLT 236 199 187 202 166   Cardiac Enzymes: No results for input(s): CKTOTAL, CKMB, CKMBINDEX, TROPONINI in the last 168 hours. BNP: Invalid input(s): POCBNP CBG: Recent Labs  Lab 06/18/21 1627 06/18/21 2114 06/19/21 0745 06/19/21 1109 06/19/21 1614  GLUCAP 156* 117* 79 97 151*   HbA1C: No  results for input(s): HGBA1C in the last 72 hours. Urine analysis:    Component Value Date/Time   COLORURINE YELLOW 06/18/2021 1406   APPEARANCEUR CLEAR 06/18/2021 1406   LABSPEC 1.007 06/18/2021 1406   PHURINE 5.0 06/18/2021 1406   GLUCOSEU NEGATIVE 06/18/2021 1406   HGBUR SMALL (A) 06/18/2021 1406   BILIRUBINUR NEGATIVE 06/18/2021 1406   KETONESUR NEGATIVE 06/18/2021 1406   PROTEINUR >=300 (A) 06/18/2021 1406   UROBILINOGEN 0.2 01/07/2015 1723   NITRITE NEGATIVE 06/18/2021 1406   LEUKOCYTESUR NEGATIVE 06/18/2021 1406   Sepsis Labs: @LABRCNTIP (procalcitonin:4,lacticidven:4) ) Recent Results (from the past 240 hour(s))  Resp Panel by RT-PCR (Flu A&B, Covid) Nasopharyngeal Swab     Status: None   Collection Time: 06/12/21 10:47 PM   Specimen: Nasopharyngeal Swab; Nasopharyngeal(NP) swabs in vial transport medium  Result Value Ref Range Status   SARS Coronavirus 2 by RT PCR NEGATIVE NEGATIVE Final    Comment: (NOTE) SARS-CoV-2 target nucleic acids are NOT DETECTED.  The SARS-CoV-2 RNA is generally detectable in upper respiratory specimens during the acute phase of infection. The lowest concentration of SARS-CoV-2 viral copies this assay can detect is 138 copies/mL. A negative result does not preclude SARS-Cov-2 infection and should not be used as the sole basis for treatment or other patient management decisions. A negative result may occur with  improper specimen collection/handling, submission of specimen other than nasopharyngeal swab, presence of viral mutation(s) within the areas targeted by this assay, and inadequate number of viral copies(<138 copies/mL). A negative result must be combined with clinical observations, patient history, and epidemiological information. The expected result is Negative.  Fact Sheet for Patients:  EntrepreneurPulse.com.au  Fact Sheet for Healthcare Providers:  IncredibleEmployment.be  This test is no t  yet approved or cleared by the [place] FDA and  has been authorized for detection and/or diagnosis of SARS-CoV-2 by FDA under an Emergency Use Authorization (EUA). This EUA will remain  in effect (meaning this test can be used) for the duration  of the COVID-19 declaration under Section 564(b)(1) of the Act, 21 U.S.C.section 360bbb-3(b)(1), unless the authorization is terminated  or revoked sooner.       Influenza A by PCR NEGATIVE NEGATIVE Final   Influenza B by PCR NEGATIVE NEGATIVE Final    Comment: (NOTE) The Xpert Xpress SARS-CoV-2/FLU/RSV plus assay is intended as an aid in the diagnosis of influenza from Nasopharyngeal swab specimens and should not be used as a sole basis for treatment. Nasal washings and aspirates are unacceptable for Xpert Xpress SARS-CoV-2/FLU/RSV testing.  Fact Sheet for Patients: EntrepreneurPulse.com.au  Fact Sheet for Healthcare Providers: IncredibleEmployment.be  This test is not yet approved or cleared by the [place] FDA and has been authorized for detection and/or diagnosis of SARS-CoV-2 by FDA under an Emergency Use Authorization (EUA). This EUA will remain in effect (meaning this test can be used) for the duration of the COVID-19 declaration under Section 564(b)(1) of the Act, 21 U.S.C. section 360bbb-3(b)(1), unless the authorization is terminated or revoked.  Performed at [facility], [address], [place], [place] [address]      Scheduled Meds:  darbepoetin (ARANESP) injection - NON-DIALYSIS  60 mcg Subcutaneous Q Sun-1800   feeding supplement  237 mL Oral BID BM   furosemide  80 mg Intravenous Q6H   heparin  5,000 Units Subcutaneous Q8H   insulin aspart  0-5 Units Subcutaneous QHS   insulin aspart  0-6 Units Subcutaneous TID WC   insulin glargine-yfgn  6 Units Subcutaneous QHS   melatonin  6 mg Oral QHS   metoprolol tartrate  50 mg Oral BID   rosuvastatin  10 mg Oral Daily   vitamin  B-12  1,000 mcg Oral Daily   Continuous Infusions:  albumin human 25 g (06/19/21 0948)    Procedures/Studies: ECHOCARDIOGRAM COMPLETE  Result Date: 06/13/2021    ECHOCARDIOGRAM REPORT   Patient Name:   AVINA EBERLE Date of Exam: 06/13/2021 Medical Rec #:  [id number]           Height:       67.0 in Accession #:    [id number]          Weight:       164.8 lb Date of Birth:  [birth year]           BSA:          1.863 m Patient Age:    25 years            BP:           122/78 mmHg Patient Gender: F                   HR:           83 bpm. Exam Location:  [facility] Procedure: 2D Echo, Cardiac Doppler and Color Doppler Indications:    Edema  History:        Patient has no prior history of Echocardiogram examinations.                 Risk Factors:Hypertension and Diabetes.  Sonographer:    Wenda Low Referring Phys: [id number] AS[facility]  1. Left ventricular ejection fraction, by estimation, is approximately 50%. There is low normal function. The left ventricle has no wall motion abnormalities. The left ventricular internal cavity size was normal in size. There is mild to moderate asymmetric left ventricular hypertrophy of the posterior segment. Myocardium is densely echogenic. Also somewhat promiment trabeculation at the apex and along  lateral wall.  2. Right ventricular systolic function is normal. The right ventricular size is normal. Mildly increased right ventricular wall thickness. There is normal pulmonary artery systolic pressure. The estimated right ventricular systolic pressure is 78.4 mmHg.  3. A small pericardial effusion is present. The pericardial effusion is posterior to the left ventricle and localized near the right atrium.  4. The aortic valve is tricuspid. Aortic valve regurgitation is not visualized. No aortic stenosis is present. Aortic valve mean gradient measures 4.0 mmHg.  5. The mitral valve is grossly normal. Trivial mitral valve regurgitation.  6. Left atrial size  was mildly dilated. Comparison(s): No prior Echocardiogram. When clinically feasible, consider cardiac MRI for further evaluation of possible infiltrative process such as amyloidosis and to exclude ventricular noncompaction. Could also order limited echocardiogram to obtain  strain imaging. FINDINGS  Left Ventricle: Left ventricular ejection fraction, by estimation, is 50%. The left ventricle has low normal function. The left ventricle has no regional wall motion abnormalities. The left ventricular internal cavity size was normal in size. There is moderate asymmetric left ventricular hypertrophy of the inferior, inferior and inferior segments. Left ventricular diastolic parameters were normal. Right Ventricle: The right ventricular size is normal. Mildly increased right ventricular wall thickness. Right ventricular systolic function is normal. There is normal pulmonary artery systolic pressure. The tricuspid regurgitant velocity is 2.70 m/s, and with an assumed right atrial pressure of 3 mmHg, the estimated right ventricular systolic pressure is 69.6 mmHg. Left Atrium: Left atrial size was mildly dilated. Right Atrium: Right atrial size was normal in size. Pericardium: A small pericardial effusion is present. The pericardial effusion is posterior to the left ventricle and localized near the right atrium. Mitral Valve: The mitral valve is grossly normal. Trivial mitral valve regurgitation. MV peak gradient, 6.2 mmHg. The mean mitral valve gradient is 3.0 mmHg. Tricuspid Valve: The tricuspid valve is grossly normal. Tricuspid valve regurgitation is trivial. Aortic Valve: The aortic valve is tricuspid. Aortic valve regurgitation is not visualized. No aortic stenosis is present. Aortic valve mean gradient measures 4.0 mmHg. Aortic valve peak gradient measures 7.8 mmHg. Aortic valve area, by VTI measures 1.83 cm. Pulmonic Valve: The pulmonic valve was grossly normal. Pulmonic valve regurgitation is trivial. Aorta: The  aortic root is normal in size and structure. IAS/Shunts: There is right bowing of the interatrial septum, suggestive of elevated left atrial pressure. No atrial level shunt detected by color flow Doppler.  LEFT VENTRICLE PLAX 2D LVIDd:         4.30 cm     Diastology LVIDs:         3.30 cm     LV e' medial:    9.03 cm/s LV PW:         1.40 cm     LV E/e' medial:  12.5 LV IVS:        0.90 cm     LV e' lateral:   9.57 cm/s LVOT diam:     1.90 cm     LV E/e' lateral: 11.8 LV SV:         60 LV SV Index:   32 LVOT Area:     2.84 cm  LV Volumes (MOD) LV vol d, MOD A2C: 53.2 ml LV vol d, MOD A4C: 88.1 ml LV vol s, MOD A2C: 31.0 ml LV vol s, MOD A4C: 43.8 ml LV SV MOD A2C:     22.2 ml LV SV MOD A4C:     88.1 ml LV SV  MOD BP:      34.9 ml RIGHT VENTRICLE RV Basal diam:  2.95 cm RV Mid diam:    2.20 cm RV S prime:     13.10 cm/s TAPSE (M-mode): 3.5 cm LEFT ATRIUM             Index        RIGHT ATRIUM           Index LA diam:        2.90 cm 1.56 cm/m   RA Area:     17.70 cm LA Vol (A2C):   48.6 ml 26.09 ml/m  RA Volume:   40.00 ml  21.48 ml/m LA Vol (A4C):   78.6 ml 42.20 ml/m LA Biplane Vol: 64.2 ml 34.47 ml/m  AORTIC VALVE                    PULMONIC VALVE AV Area (Vmax):    1.96 cm     PV Vmax:       0.68 m/s AV Area (Vmean):   1.98 cm     PV Peak grad:  1.9 mmHg AV Area (VTI):     1.83 cm AV Vmax:           140.00 cm/s AV Vmean:          90.300 cm/s AV VTI:            0.326 m AV Peak Grad:      7.8 mmHg AV Mean Grad:      4.0 mmHg LVOT Vmax:         96.60 cm/s LVOT Vmean:        63.200 cm/s LVOT VTI:          0.210 m LVOT/AV VTI ratio: 0.64  AORTA Ao Asc diam: 2.30 cm MITRAL VALVE                TRICUSPID VALVE MV Area (PHT): 4.36 cm     TR Peak grad:   29.2 mmHg MV Area VTI:   2.18 cm     TR Vmax:        270.00 cm/s MV Peak grad:  6.2 mmHg MV Mean grad:  3.0 mmHg     SHUNTS MV Vmax:       1.25 m/s     Systemic VTI:  0.21 m MV Vmean:      71.8 cm/s    Systemic Diam: 1.90 cm MV Decel Time: 174 msec MV E velocity:  113.00 cm/s MV A velocity: 102.00 cm/s MV E/A ratio:  1.11 Rozann Lesches MD Electronically signed by Rozann Lesches MD Signature Date/Time: 06/13/2021/2:42:35 PM    Final     Orson Eva, DO  [facility]  If 7PM-7AM, please contact night-coverage [email or web address] Password [email or web address] 06/19/2021, 5:04 PM   LOS: 7 days

## 2021-06-19 NOTE — Telephone Encounter (Signed)
Patient currently in the hospital due to acute on chronic kidney disease and significant peripheral edema. Once discharged we will consider moving forwards with egd/colonoscopy.

## 2021-06-19 NOTE — Progress Notes (Signed)
Subjective:   weights not coming down a whole lot- multiple salty snacks by the bedside-- pt reports not eating them.     Objective Vital signs in last 24 hours: Vitals:   06/18/21 2144 06/19/21 0433 06/19/21 0500 06/19/21 0955  BP: (!) 153/95 (!) 149/86  (!) 167/106  Pulse: 93 87  90  Resp: 20 20    Temp: 99.4 F (37.4 C) 98.6 F (37 C)    TempSrc: Oral Oral    SpO2: 99% 99%    Weight:   74 kg   Height:       Weight change:   Intake/Output Summary (Last 24 hours) at 06/19/2021 1043 Last data filed at 06/19/2021 0900 Gross per 24 hour  Intake 940 ml  Output 1100 ml  Net -160 ml    Assessment/ Plan: Pt is a 26 y.o. yo female with biopsy proven diabetic kidney disease- nephrotic syndrome who was admitted on 06/12/2021 with volume overload    Assessment/Plan: 1. Volume overload-  due to her nephrotic syndrome and CKD-  difficult problem to treat.  Weights not coming down, continue Lasix 80 mg IV q 6 / albumin 25 g BID  Not on a fluid restriction- have placed 1200 mL. Has UNNA boots now- appreciate.  Think that when ready to transition to PO will need torsemide as can do a little better in people with low albumin. 2. 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  No change for now 3. Anemia- due to CKD-   not helping the third spacing-  on IV iron and ESA -  also given 2 units of blood this admission.  Hgb 9.6 4. Metabolic acidosis-  due to CKD-  hesitate to treat with oral  bicarb given sodium load, improving    Lindsey Murillo    Labs: Basic Metabolic Panel: Recent Labs  Lab 06/17/21 0604 06/18/21 0619 06/19/21 0419  NA 136 135 136  K 3.4* 3.6 3.4*  CL 110 107 107  CO2 19* 20* 21*  GLUCOSE 54* 87 90  BUN 51* 51* 51*  CREATININE 2.93* 3.13* 3.02*  CALCIUM 8.2* 7.9* 8.4*  PHOS 6.0* 5.5* 5.2*   Liver Function Tests: Recent Labs  Lab 06/12/21 2102 06/13/21 0508 06/14/21 0403  06/17/21 0604 06/18/21 0619 06/19/21 0419  AST 28 19  --   --   --   --   ALT 30 22  --   --   --   --   ALKPHOS 179* 138*  --   --   --   --   BILITOT 0.2* 0.2*  --   --   --   --   PROT 6.0* 4.9*  --   --   --   --   ALBUMIN 1.9* 1.6*   < > 1.8* 1.7* 2.3*   < > = values in this interval not displayed.   No results for input(s): LIPASE, AMYLASE in the last 168 hours. No results for input(s): AMMONIA in the last 168 hours. CBC: Recent Labs  Lab 06/12/21 2102 06/13/21 0508 06/14/21 0403 06/16/21 0923 06/19/21 0419  WBC 11.5* 9.3 8.4 9.0 10.3  NEUTROABS 7.8* 6.6  --   --   --   HGB 8.1* 6.4* 8.6* 9.6* 8.3*  HCT 25.8* 20.6* 25.6* 28.7* 25.8*  MCV 92.1 88.8 89.5 88.3 88.4  PLT 236 199 187 202 166   Cardiac Enzymes:  No results for input(s): CKTOTAL, CKMB, CKMBINDEX, TROPONINI in the last 168 hours. CBG: Recent Labs  Lab 06/18/21 1213 06/18/21 1336 06/18/21 1627 06/18/21 2114 06/19/21 0745  GLUCAP 66* 100* 156* 117* 79    Iron Studies: No results for input(s): IRON, TIBC, TRANSFERRIN, FERRITIN in the last 72 hours. Studies/Results: No results found. Medications: Infusions:  albumin human 25 g (06/19/21 0948)   furosemide       Scheduled Medications:  darbepoetin (ARANESP) injection - NON-DIALYSIS  60 mcg Subcutaneous Q Sun-1800   feeding supplement  237 mL Oral BID BM   heparin  5,000 Units Subcutaneous Q8H   insulin aspart  0-5 Units Subcutaneous QHS   insulin aspart  0-6 Units Subcutaneous TID WC   insulin glargine-yfgn  6 Units Subcutaneous QHS   melatonin  6 mg Oral QHS   metoprolol tartrate  50 mg Oral BID   rosuvastatin  10 mg Oral Daily   vitamin B-12  1,000 mcg Oral Daily    have reviewed scheduled and prn medications.  Physical Exam: General: eating breakfast  Heart: RRR Lungs: dec BS at bases Abdomen: obese, soft, non tender Extremities: discolored legs with pitting edema-  evidence of skin breakdown and weeping     06/19/2021,10:43 AM   LOS: 7 days

## 2021-06-20 LAB — RENAL FUNCTION PANEL
Albumin: 2.7 g/dL — ABNORMAL LOW (ref 3.5–5.0)
Anion gap: 10 (ref 5–15)
BUN: 52 mg/dL — ABNORMAL HIGH (ref 6–20)
CO2: 21 mmol/L — ABNORMAL LOW (ref 22–32)
Calcium: 8.5 mg/dL — ABNORMAL LOW (ref 8.9–10.3)
Chloride: 107 mmol/L (ref 98–111)
Creatinine, Ser: 2.98 mg/dL — ABNORMAL HIGH (ref 0.44–1.00)
GFR, Estimated: 21 mL/min — ABNORMAL LOW (ref >60)
Glucose, Bld: 182 mg/dL — ABNORMAL HIGH (ref 70–99)
Phosphorus: 4.9 mg/dL — ABNORMAL HIGH (ref 2.5–4.6)
Potassium: 3.2 mmol/L — ABNORMAL LOW (ref 3.5–5.1)
Sodium: 138 mmol/L (ref 135–145)

## 2021-06-20 LAB — CBC
HCT: 26.5 % — ABNORMAL LOW (ref 36.0–46.0)
Hemoglobin: 8.4 g/dL — ABNORMAL LOW (ref 12.0–15.0)
MCH: 28.4 pg (ref 26.0–34.0)
MCHC: 31.7 g/dL (ref 30.0–36.0)
MCV: 89.5 fL (ref 80.0–100.0)
Platelets: 174 10*3/uL (ref 150–400)
RBC: 2.96 MIL/uL — ABNORMAL LOW (ref 3.87–5.11)
RDW: 14.1 % (ref 11.5–15.5)
WBC: 9.7 10*3/uL (ref 4.0–10.5)
nRBC: 0.2 % (ref 0.0–0.2)

## 2021-06-20 LAB — GLUCOSE, CAPILLARY
Glucose-Capillary: 148 mg/dL — ABNORMAL HIGH (ref 70–99)
Glucose-Capillary: 116 mg/dL — ABNORMAL HIGH (ref 70–99)
Glucose-Capillary: 188 mg/dL — ABNORMAL HIGH (ref 70–99)
Glucose-Capillary: 180 mg/dL — ABNORMAL HIGH (ref 70–99)

## 2021-06-20 MED ORDER — POTASSIUM CHLORIDE CRYS ER 20 MEQ PO TBCR
20.0000 meq | EXTENDED_RELEASE_TABLET | Freq: Once | ORAL | Status: AC
  Administered 2021-06-20: 20 meq via ORAL
  Filled 2021-06-20: qty 1

## 2021-06-20 NOTE — Progress Notes (Signed)
PROGRESS NOTE  Lindsey Murillo:[id number] DOB: 06/29/1995 DOA: 06/12/2021 PCP: Coral Spikes, DO  Brief History:  26 y.o. female with medical history significant of type 1 diabetes, autoimmune thyroiditis, diabetic neuropathy,  nephrotic syndrome, hypertension, asthma, and more presents ED with a chief complaint of BL leg weeping and edema x 1 week.   She notices blisters, come up, expand, and then burst.  She has no associated pain.  She reports that her legs are very heavy with fluid as well.  She is becoming more fatigued from trying to ambulate with this fluid overload.  Patient reports she also has dyspnea on exertion. She is not sure if she has orthopnea because she does not attempt to lay flat due to chronic abdominal pain.  Patient denies any f/c, n/v chest pain, palpitations.  Patient is following with nephrology.  In February 2021 patient had a vancomycin induced kidney injury, and she has been following with nephrology since that time.  She reports her last visit was last week.  She reports no medications were changed, and her primary complaint there was the leg swelling.  Patient reports the leg swelling has been going on since her hospitalization in February 2021 but has been gradually worsening over an unknown period.  In February 2021 she had negative DVT studies bilaterally.  The conclusion was that patient's third spacing of fluid was secondary to hypoalbuminemia.  She was started on protein shakes.  Mother at bedside reports she has not been using protein shakes.  Patient reports worsen lower extremity swelling.  Nephrology was consulted to assist with management.  IV lasix was started and titrated to 80 mg IV q 6 hours with slow improvement.  IV albumin also added as well as protein supplementation.  Wound care consult was placed for LE edema changes>>Wash legs with soap and water. Pat dry. Apply Sween Moisturizing Ointment. Beginning behind the toes and going to just below  the knees, spiral wrap kerlix, then 4 inch ace wrap. Perform daily.     Assessment and Plan: * Acute renal failure superimposed on stage 4 chronic kidney disease ([place])- (present on admission) -Last creatinine around  2.3 -now new baseline 2.8-3.0 -remains fluid overloaded -continue IV lasix 80 mg q 6 hrs>>creatinine remains stable -setting of decreased effective arterial blood volume due to nephrotic syndrome and significant third spacing with ongoing home diuresis/ACE inhibitor management. -appreciate nephrology>>add IV albumin bid -hold ACEi -serum creatinine holding stable with IV lasix -2/10 standing weight 139 lbs>>137 (2/11)   Hypokalemia repleted judiciously in pt with CKD4  Venous stasis dermatitis See pics of legs Not sure compression dressings will help>>ordered trial Significant lichenification -consult wound care nurse>>appreciate recommendations>> Wash legs with soap and water. Pat dry. Apply Sween Moisturizing Ointment. Beginning behind the toes and going to just below the knees, spiral wrap kerlix, then 4 inch ace wrap. Perform daily.   Anemia of chronic disease- (present on admission) -Hemoglobin down to 6.4 at time of admission -2 units PRBCs has been transfused this admission -Hgb remains stable after transfusion -IV iron and ESA therapy as per nephrology  -No signs of overt bleeding.  Peripheral edema Continue SCDs Encourage elevation of legs Treating with albumin and lasix Continue to monitor  Hypoalbuminemia- (present on admission) -Patient has history of nephrotic syndrome but also does not maintain nutrient dense p.o. intake -Advised to use Nepro. -Restarted IV albumin bid  GERD (gastroesophageal reflux disease)- (present on admission) -  Continue PPI.  Uncontrolled type 1 diabetes mellitus with hyperglycemia ([place])- (present on admission) -Continue current use of sliding scale insulin and basal insulin while inpatient. -decrease to sliding scale  for renal patient due to hypoglycemia -decrease Semglee dose   Essential hypertension, benign- (present on admission) -d/c ACEi -Continue metoprolol. -slow improvement with diuressis  Status is: Inpatient Remains inpatient appropriate because: severity of illness;  remains fluid overloaded requiring IV lasix.  remains fluid overloaded                 Family Communication:   mother updated at bedside 2/11   Consultants:  renal   Code Status:  FULL    DVT Prophylaxis:  [place] Heparin     Procedures: As Listed in Progress Note Above   Antibiotics: None                   Subjective: Patient denies fevers, chills, headache, chest pain, dyspnea, nausea, vomiting, diarrhea, abdominal pain, dysuria, hematuria, hematochezia, and melena.   Objective: Vitals:   06/20/21 0449 06/20/21 0500 06/20/21 0817 06/20/21 1359  BP: (!) 147/91  (!) 144/97 (!) 142/82  Pulse: 88  84 80  Resp:    18  Temp:    98.5 F (36.9 C)  TempSrc:    Oral  SpO2: 98%   98%  Weight:  67.2 kg    Height:        Intake/Output Summary (Last 24 hours) at 06/20/2021 1508 Last data filed at 06/20/2021 6010 Gross per 24 hour  Intake 1118.18 ml  Output 1250 ml  Net -131.82 ml   Weight change: -6.8 kg Exam:  General:  Pt is alert, follows commands appropriately, not in acute distress HEENT: No icterus, No thrush, No neck mass, [place]/AT Cardiovascular: RRR, S1/S2, no rubs, no gallops Respiratory: fine bibasilar crackles. No wheeze Abdomen: Soft/+BS, non tender, non distended, no guarding Extremities: 2+LE edema, No lymphangitis, No petechiae, No rashes, no synovitis   Data Reviewed: I have personally reviewed following labs and imaging studies Basic Metabolic Panel: Recent Labs  Lab 06/15/21 0512 06/16/21 0923 06/17/21 0604 06/18/21 0619 06/19/21 0419 06/20/21 0436  NA 137 136 136 135 136 138  K 3.7 3.7 3.4* 3.6 3.4* 3.2*  CL 112* 109 110 107 107 107  CO2 18* 17* 19* 20* 21* 21*   GLUCOSE 101* 123* 54* 87 90 182*  BUN 50* 51* 51* 51* 51* 52*  CREATININE 2.85* 3.00* 2.93* 3.13* 3.02* 2.98*  CALCIUM 8.0* 8.1* 8.2* 7.9* 8.4* 8.5*  MG 1.8  --   --   --   --   --   PHOS 6.5* 6.1* 6.0* 5.5* 5.2* 4.9*   Liver Function Tests: Recent Labs  Lab 06/16/21 0923 06/17/21 0604 06/18/21 0619 06/19/21 0419 06/20/21 0436  ALBUMIN 2.0* 1.8* 1.7* 2.3* 2.7*   No results for input(s): LIPASE, AMYLASE in the last 168 hours. No results for input(s): AMMONIA in the last 168 hours. Coagulation Profile: No results for input(s): INR, PROTIME in the last 168 hours. CBC: Recent Labs  Lab 06/14/21 0403 06/16/21 0923 06/19/21 0419 06/20/21 0436  WBC 8.4 9.0 10.3 9.7  HGB 8.6* 9.6* 8.3* 8.4*  HCT 25.6* 28.7* 25.8* 26.5*  MCV 89.5 88.3 88.4 89.5  PLT 187 202 166 174   Cardiac Enzymes: No results for input(s): CKTOTAL, CKMB, CKMBINDEX, TROPONINI in the last 168 hours. BNP: Invalid input(s): POCBNP CBG: Recent Labs  Lab 06/19/21 1109 06/19/21 1614 06/19/21 2059 06/20/21 0720 06/20/21  1115  GLUCAP 97 151* 232* 148* 116*   HbA1C: No results for input(s): HGBA1C in the last 72 hours. Urine analysis:    Component Value Date/Time   COLORURINE YELLOW 06/18/2021 1406   APPEARANCEUR CLEAR 06/18/2021 1406   LABSPEC 1.007 06/18/2021 1406   PHURINE 5.0 06/18/2021 1406   GLUCOSEU NEGATIVE 06/18/2021 1406   HGBUR SMALL (A) 06/18/2021 1406   BILIRUBINUR NEGATIVE 06/18/2021 1406   KETONESUR NEGATIVE 06/18/2021 1406   PROTEINUR >=300 (A) 06/18/2021 1406   UROBILINOGEN 0.2 01/07/2015 1723   NITRITE NEGATIVE 06/18/2021 1406   LEUKOCYTESUR NEGATIVE 06/18/2021 1406   Sepsis Labs: @LABRCNTIP (procalcitonin:4,lacticidven:4) ) Recent Results (from the past 240 hour(s))  Resp Panel by RT-PCR (Flu A&B, Covid) Nasopharyngeal Swab     Status: None   Collection Time: 06/12/21 10:47 PM   Specimen: Nasopharyngeal Swab; Nasopharyngeal(NP) swabs in vial transport medium  Result Value  Ref Range Status   SARS Coronavirus 2 by RT PCR NEGATIVE NEGATIVE Final    Comment: (NOTE) SARS-CoV-2 target nucleic acids are NOT DETECTED.  The SARS-CoV-2 RNA is generally detectable in upper respiratory specimens during the acute phase of infection. The lowest concentration of SARS-CoV-2 viral copies this assay can detect is 138 copies/mL. A negative result does not preclude SARS-Cov-2 infection and should not be used as the sole basis for treatment or other patient management decisions. A negative result may occur with  improper specimen collection/handling, submission of specimen other than nasopharyngeal swab, presence of viral mutation(s) within the areas targeted by this assay, and inadequate number of viral copies(<138 copies/mL). A negative result must be combined with clinical observations, patient history, and epidemiological information. The expected result is Negative.  Fact Sheet for Patients:  EntrepreneurPulse.com.au  Fact Sheet for Healthcare Providers:  IncredibleEmployment.be  This test is no t yet approved or cleared by the [place] FDA and  has been authorized for detection and/or diagnosis of SARS-CoV-2 by FDA under an Emergency Use Authorization (EUA). This EUA will remain  in effect (meaning this test can be used) for the duration of the COVID-19 declaration under Section 564(b)(1) of the Act, 21 U.S.C.section 360bbb-3(b)(1), unless the authorization is terminated  or revoked sooner.       Influenza A by PCR NEGATIVE NEGATIVE Final   Influenza B by PCR NEGATIVE NEGATIVE Final    Comment: (NOTE) The Xpert Xpress SARS-CoV-2/FLU/RSV plus assay is intended as an aid in the diagnosis of influenza from Nasopharyngeal swab specimens and should not be used as a sole basis for treatment. Nasal washings and aspirates are unacceptable for Xpert Xpress SARS-CoV-2/FLU/RSV testing.  Fact Sheet for  Patients: EntrepreneurPulse.com.au  Fact Sheet for Healthcare Providers: IncredibleEmployment.be  This test is not yet approved or cleared by the [place] FDA and has been authorized for detection and/or diagnosis of SARS-CoV-2 by FDA under an Emergency Use Authorization (EUA). This EUA will remain in effect (meaning this test can be used) for the duration of the COVID-19 declaration under Section 564(b)(1) of the Act, 21 U.S.C. section 360bbb-3(b)(1), unless the authorization is terminated or revoked.  Performed at [facility], [address], [place], [place] [address]   Urine Culture     Status: Abnormal (Preliminary result)   Collection Time: 06/18/21  2:07 PM   Specimen: Urine, Clean Catch  Result Value Ref Range Status   Specimen Description   Final    URINE, CLEAN CATCH Performed at [facility], [address], [place], [place] [address]    Special Requests  Final    NONE Performed at [facility], [address], [place], [place] [address]    Culture (A)  Final    >=100,000 COLONIES/mL ESCHERICHIA COLI SUSCEPTIBILITIES TO FOLLOW Performed at [facility] [address], [place], [place] [address]    Report Status PENDING  Incomplete     Scheduled Meds:  darbepoetin (ARANESP) injection - NON-DIALYSIS  60 mcg Subcutaneous Q Sun-1800   feeding supplement  237 mL Oral BID BM   furosemide  80 mg Intravenous Q6H   heparin  5,000 Units Subcutaneous Q8H   insulin aspart  0-5 Units Subcutaneous QHS   insulin aspart  0-6 Units Subcutaneous TID WC   insulin glargine-yfgn  6 Units Subcutaneous QHS   melatonin  6 mg Oral QHS   metoprolol tartrate  50 mg Oral BID   rosuvastatin  10 mg Oral Daily   vitamin B-12  1,000 mcg Oral Daily   Continuous Infusions:  albumin human 25 g (06/20/21 0809)    Procedures/Studies: ECHOCARDIOGRAM COMPLETE  Result Date: 06/13/2021    ECHOCARDIOGRAM REPORT   Patient Name:   LATRELL REITAN Date of Exam: 06/13/2021 Medical Rec #:  [id number]           Height:       67.0 in Accession #:    [id number]          Weight:       164.8 lb Date of Birth:  1996-05-02           BSA:          1.863 m Patient Age:    25 years            BP:           122/78 mmHg Patient Gender: F                   HR:           83 bpm. Exam Location:  [facility] Procedure: 2D Echo, Cardiac Doppler and Color Doppler Indications:    Edema  History:        Patient has no prior history of Echocardiogram examinations.                 Risk Factors:Hypertension and Diabetes.  Sonographer:    Wenda Low Referring Phys: [id number] ASIA B [place]  1. Left ventricular ejection fraction, by estimation, is approximately 50%. There is low normal function. The left ventricle has no wall motion abnormalities. The left ventricular internal cavity size was normal in size. There is mild to moderate asymmetric left ventricular hypertrophy of the posterior segment. Myocardium is densely echogenic. Also somewhat promiment trabeculation at the apex and along lateral wall.  2. Right ventricular systolic function is normal. The right ventricular size is normal. Mildly increased right ventricular wall thickness. There is normal pulmonary artery systolic pressure. The estimated right ventricular systolic pressure is 28.7 mmHg.  3. A small pericardial effusion is present. The pericardial effusion is posterior to the left ventricle and localized near the right atrium.  4. The aortic valve is tricuspid. Aortic valve regurgitation is not visualized. No aortic stenosis is present. Aortic valve mean gradient measures 4.0 mmHg.  5. The mitral valve is grossly normal. Trivial mitral valve regurgitation.  6. Left atrial size was mildly dilated. Comparison(s): No prior Echocardiogram. When clinically feasible, consider cardiac MRI for further evaluation of possible infiltrative process such as amyloidosis and to exclude ventricular  noncompaction. Could also order limited echocardiogram to obtain  strain imaging. FINDINGS  Left Ventricle: Left ventricular ejection fraction, by estimation, is 50%. The left ventricle has low normal function. The left ventricle has no regional wall motion abnormalities. The left ventricular internal cavity size was normal in size. There is moderate asymmetric left ventricular hypertrophy of the inferior, inferior and inferior segments. Left ventricular diastolic parameters were normal. Right Ventricle: The right ventricular size is normal. Mildly increased right ventricular wall thickness. Right ventricular systolic function is normal. There is normal pulmonary artery systolic pressure. The tricuspid regurgitant velocity is 2.70 m/s, and with an assumed right atrial pressure of 3 mmHg, the estimated right ventricular systolic pressure is 81.4 mmHg. Left Atrium: Left atrial size was mildly dilated. Right Atrium: Right atrial size was normal in size. Pericardium: A small pericardial effusion is present. The pericardial effusion is posterior to the left ventricle and localized near the right atrium. Mitral Valve: The mitral valve is grossly normal. Trivial mitral valve regurgitation. MV peak gradient, 6.2 mmHg. The mean mitral valve gradient is 3.0 mmHg. Tricuspid Valve: The tricuspid valve is grossly normal. Tricuspid valve regurgitation is trivial. Aortic Valve: The aortic valve is tricuspid. Aortic valve regurgitation is not visualized. No aortic stenosis is present. Aortic valve mean gradient measures 4.0 mmHg. Aortic valve peak gradient measures 7.8 mmHg. Aortic valve area, by VTI measures 1.83 cm. Pulmonic Valve: The pulmonic valve was grossly normal. Pulmonic valve regurgitation is trivial. Aorta: The aortic root is normal in size and structure. IAS/Shunts: There is right bowing of the interatrial septum, suggestive of elevated left atrial pressure. No atrial level shunt detected by color flow Doppler.  LEFT  VENTRICLE PLAX 2D LVIDd:         4.30 cm     Diastology LVIDs:         3.30 cm     LV e' medial:    9.03 cm/s LV PW:         1.40 cm     LV E/e' medial:  12.5 LV IVS:        0.90 cm     LV e' lateral:   9.57 cm/s LVOT diam:     1.90 cm     LV E/e' lateral: 11.8 LV SV:         60 LV SV Index:   32 LVOT Area:     2.84 cm  LV Volumes (MOD) LV vol d, MOD A2C: 53.2 ml LV vol d, MOD A4C: 88.1 ml LV vol s, MOD A2C: 31.0 ml LV vol s, MOD A4C: 43.8 ml LV SV MOD A2C:     22.2 ml LV SV MOD A4C:     88.1 ml LV SV MOD BP:      34.9 ml RIGHT VENTRICLE RV Basal diam:  2.95 cm RV Mid diam:    2.20 cm RV S prime:     13.10 cm/s TAPSE (M-mode): 3.5 cm LEFT ATRIUM             Index        RIGHT ATRIUM           Index LA diam:        2.90 cm 1.56 cm/m   RA Area:     17.70 cm LA Vol (A2C):   48.6 ml 26.09 ml/m  RA Volume:   40.00 ml  21.48 ml/m LA Vol (A4C):   78.6 ml 42.20 ml/m LA Biplane Vol: 64.2 ml 34.47 ml/m  AORTIC VALVE                    PULMONIC VALVE AV Area (Vmax):    1.96 cm     PV Vmax:       0.68 m/s AV Area (Vmean):   1.98 cm     PV Peak grad:  1.9 mmHg AV Area (VTI):     1.83 cm AV Vmax:           140.00 cm/s AV Vmean:          90.300 cm/s AV VTI:            0.326 m AV Peak Grad:      7.8 mmHg AV Mean Grad:      4.0 mmHg LVOT Vmax:         96.60 cm/s LVOT Vmean:        63.200 cm/s LVOT VTI:          0.210 m LVOT/AV VTI ratio: 0.64  AORTA Ao Asc diam: 2.30 cm MITRAL VALVE                TRICUSPID VALVE MV Area (PHT): 4.36 cm     TR Peak grad:   29.2 mmHg MV Area VTI:   2.18 cm     TR Vmax:        270.00 cm/s MV Peak grad:  6.2 mmHg MV Mean grad:  3.0 mmHg     SHUNTS MV Vmax:       1.25 m/s     Systemic VTI:  0.21 m MV Vmean:      71.8 cm/s    Systemic Diam: 1.90 cm MV Decel Time: 174 msec MV E velocity: 113.00 cm/s MV A velocity: 102.00 cm/s MV E/A ratio:  1.11 Rozann Lesches MD Electronically signed by Rozann Lesches MD Signature Date/Time: 06/13/2021/2:42:35 PM    Final     Orson Eva, DO  [facility]  If 7PM-7AM, please contact night-coverage [email or web address] Password [email or web address] 06/20/2021, 3:08 PM   LOS: 8 days

## 2021-06-20 NOTE — Progress Notes (Signed)
Patient refused to allow this writer to do dressing change to bilateral lower extremities. Patient stated she prefers dressing to be changed during the night. MD Tat made aware.

## 2021-06-20 NOTE — TOC Progression Note (Signed)
Transition of Care [facility]) - Progression Note    Patient Details  Name: Lindsey Murillo MRN: [id number] Date of Birth: November 06, 1995  Transition of Care [facility]) CM/SW [place], RN Phone Number: 06/20/2021, 11:41 AM  Clinical Narrative: Patient NMS, will continue diuresing and assess for possible discharge on Tues. TOC to continue to track for needs.        Barriers to Discharge: Continued Medical Work up  Expected Discharge Plan and Services                                                 Social Determinants of Health (SDOH) Interventions    Readmission Risk Interventions No flowsheet data found.

## 2021-06-21 LAB — RENAL FUNCTION PANEL
Albumin: 2.9 g/dL — ABNORMAL LOW (ref 3.5–5.0)
Anion gap: 12 (ref 5–15)
BUN: 51 mg/dL — ABNORMAL HIGH (ref 6–20)
CO2: 21 mmol/L — ABNORMAL LOW (ref 22–32)
Calcium: 8.6 mg/dL — ABNORMAL LOW (ref 8.9–10.3)
Chloride: 106 mmol/L (ref 98–111)
Creatinine, Ser: 2.98 mg/dL — ABNORMAL HIGH (ref 0.44–1.00)
GFR, Estimated: 21 mL/min — ABNORMAL LOW (ref >60)
Glucose, Bld: 159 mg/dL — ABNORMAL HIGH (ref 70–99)
Phosphorus: 4.6 mg/dL (ref 2.5–4.6)
Potassium: 3 mmol/L — ABNORMAL LOW (ref 3.5–5.1)
Sodium: 139 mmol/L (ref 135–145)

## 2021-06-21 LAB — URINE CULTURE: Culture: >=100000 — AB

## 2021-06-21 LAB — GLUCOSE, CAPILLARY
Glucose-Capillary: 138 mg/dL — ABNORMAL HIGH (ref 70–99)
Glucose-Capillary: 146 mg/dL — ABNORMAL HIGH (ref 70–99)
Glucose-Capillary: 136 mg/dL — ABNORMAL HIGH (ref 70–99)
Glucose-Capillary: 139 mg/dL — ABNORMAL HIGH (ref 70–99)

## 2021-06-21 MED ORDER — POTASSIUM CHLORIDE CRYS ER 20 MEQ PO TBCR
40.0000 meq | EXTENDED_RELEASE_TABLET | Freq: Once | ORAL | Status: AC
  Administered 2021-06-21: 40 meq via ORAL
  Filled 2021-06-21: qty 2

## 2021-06-21 MED ORDER — POTASSIUM CHLORIDE CRYS ER 20 MEQ PO TBCR
20.0000 meq | EXTENDED_RELEASE_TABLET | Freq: Once | ORAL | Status: AC
  Administered 2021-06-21: 20 meq via ORAL
  Filled 2021-06-21: qty 1

## 2021-06-21 NOTE — Progress Notes (Addendum)
PROGRESS NOTE  Lindsey Murillo EYC:[id number] DOB: [birth year] DOA: 06/12/2021 PCP: Coral Spikes, DO  Brief History:  26 y.o. female with medical history significant of type 1 diabetes, autoimmune thyroiditis, diabetic neuropathy,  nephrotic syndrome, hypertension, asthma, and more presents ED with a chief complaint of BL leg weeping and edema x 1 week.   She notices blisters, come up, expand, and then burst.  She has no associated pain.  She reports that her legs are very heavy with fluid as well.  She is becoming more fatigued from trying to ambulate with this fluid overload.  Patient reports she also has dyspnea on exertion. She is not sure if she has orthopnea because she does not attempt to lay flat due to chronic abdominal pain.  Patient denies any f/c, n/v chest pain, palpitations.  Patient is following with nephrology.  In February 2021 patient had a vancomycin induced kidney injury, and she has been following with nephrology since that time.  She reports her last visit was last week.  She reports no medications were changed, and her primary complaint there was the leg swelling.  Patient reports the leg swelling has been going on since her hospitalization in February 2021 but has been gradually worsening over an unknown period.  In February 2021 she had negative DVT studies bilaterally.  The conclusion was that patient's third spacing of fluid was secondary to hypoalbuminemia.  She was started on protein shakes.  Mother at bedside reports she has not been using protein shakes.  Patient reports worsen lower extremity swelling.  Nephrology was consulted to assist with management.  IV lasix was started and titrated to 80 mg IV q 6 hours with slow improvement.  IV albumin also added as well as protein supplementation.  Wound care consult was placed for LE edema changes>>Wash legs with soap and water. Pat dry. Apply Sween Moisturizing Ointment. Beginning behind the toes and going to just below  the knees, spiral wrap kerlix, then 4 inch ace wrap. Perform daily.  Overall weight is slowly trending down, but now seems to now to plateau at 137 lbs.  Serum creatinine remains stable on IV lasix.      Assessment and Plan: * Acute renal failure superimposed on stage 4 chronic kidney disease ([place])- (present on admission) -Last creatinine around  2.3 -now new baseline 2.8-3.0 -remains fluid overloaded -continue IV lasix 80 mg q 6 hrs>>creatinine remains stable -setting of decreased effective arterial blood volume due to nephrotic syndrome and significant third spacing with ongoing home diuresis/ACE inhibitor management. -appreciate nephrology>>add IV albumin bid -hold ACEi -serum creatinine holding stable with IV lasix -2/10 standing weight 139 lbs>>137 (2/11)>>137.1 (2/11) -needs strict fluid restriction   Hypokalemia repleted judiciously in pt with CKD4  Venous stasis dermatitis See pics of legs Not sure compression dressings will help>>ordered trial Significant lichenification -consult wound care nurse>>appreciate recommendations>> Wash legs with soap and water. Pat dry. Apply Sween Moisturizing Ointment. Beginning behind the toes and going to just below the knees, spiral wrap kerlix, then 4 inch ace wrap. Perform daily.   Anemia of chronic disease- (present on admission) -Hemoglobin down to 6.4 at time of admission -2 units PRBCs has been transfused this admission -Hgb remains stable after transfusion -IV iron and ESA therapy as per nephrology  -No signs of overt bleeding.  Peripheral edema Continue SCDs Encourage elevation of legs Treating with albumin and lasix Continue to monitor  Hypoalbuminemia- (present on admission) -  Patient has history of nephrotic syndrome but also does not maintain nutrient dense p.o. intake -Advised to use Nepro. -Restarted IV albumin bid  GERD (gastroesophageal reflux disease)- (present on admission) -Continue PPI.  Uncontrolled type  1 diabetes mellitus with hyperglycemia ([place])- (present on admission) -Continue current use of sliding scale insulin and basal insulin while inpatient. -decrease to sliding scale for renal patient due to hypoglycemia -decrease Semglee dose   Essential hypertension, benign- (present on admission) -d/c ACEi -Continue metoprolol. -slow improvement with diuressis   Status is: Inpatient Remains inpatient appropriate because: severity of illness;  remains fluid overloaded requiring IV lasix.  remains fluid overloaded        Family Communication:   mother updated at bedside 2/11   Consultants:  renal   Code Status:  FULL    DVT Prophylaxis:  [place] Heparin     Procedures: As Listed in Progress Note Above   Antibiotics: None       Subjective: Patient denies fevers, chills, headache, chest pain, dyspnea, nausea, vomiting, diarrhea, abdominal pain, dysuria,    Objective: Vitals:   06/21/21 0428 06/21/21 0500 06/21/21 0925 06/21/21 1311  BP: (!) 161/96  (!) 164/100 (!) 156/99  Pulse: 88  84 81  Resp:    17  Temp: 97.6 F (36.4 C)   98.6 F (37 C)  TempSrc: Oral     SpO2: 99%   100%  Weight:  62.4 kg    Height:        Intake/Output Summary (Last 24 hours) at 06/21/2021 1547 Last data filed at 06/21/2021 1500 Gross per 24 hour  Intake 1231.25 ml  Output 2400 ml  Net -1168.75 ml   Weight change: -4.8 kg Exam:  General:  Pt is alert, follows commands appropriately, not in acute distress HEENT: No icterus, No thrush, No neck mass, [place]/AT Cardiovascular: RRR, S1/S2, no rubs, no gallops Respiratory: fine bibasilar rales. No wheeze Abdomen: Soft/+BS, non tender, non distended, no guarding Extremities: 2 +LE edema, No lymphangitis, No petechiae, No rashes, no synovitis   Data Reviewed: I have personally reviewed following labs and imaging studies Basic Metabolic Panel: Recent Labs  Lab 06/15/21 0512 06/16/21 0923 06/17/21 0604 06/18/21 0619 06/19/21 0419  06/20/21 0436 06/21/21 0556  NA 137   < > 136 135 136 138 139  K 3.7   < > 3.4* 3.6 3.4* 3.2* 3.0*  CL 112*   < > 110 107 107 107 106  CO2 18*   < > 19* 20* 21* 21* 21*  GLUCOSE 101*   < > 54* 87 90 182* 159*  BUN 50*   < > 51* 51* 51* 52* 51*  CREATININE 2.85*   < > 2.93* 3.13* 3.02* 2.98* 2.98*  CALCIUM 8.0*   < > 8.2* 7.9* 8.4* 8.5* 8.6*  MG 1.8  --   --   --   --   --   --   PHOS 6.5*   < > 6.0* 5.5* 5.2* 4.9* 4.6   < > = values in this interval not displayed.   Liver Function Tests: Recent Labs  Lab 06/17/21 0604 06/18/21 0619 06/19/21 0419 06/20/21 0436 06/21/21 0556  ALBUMIN 1.8* 1.7* 2.3* 2.7* 2.9*   No results for input(s): LIPASE, AMYLASE in the last 168 hours. No results for input(s): AMMONIA in the last 168 hours. Coagulation Profile: No results for input(s): INR, PROTIME in the last 168 hours. CBC: Recent Labs  Lab 06/16/21 0923 06/19/21 0419 06/20/21 0436  WBC 9.0  10.3 9.7  HGB 9.6* 8.3* 8.4*  HCT 28.7* 25.8* 26.5*  MCV 88.3 88.4 89.5  PLT 202 166 174   Cardiac Enzymes: No results for input(s): CKTOTAL, CKMB, CKMBINDEX, TROPONINI in the last 168 hours. BNP: Invalid input(s): POCBNP CBG: Recent Labs  Lab 06/20/21 1115 06/20/21 1615 06/20/21 2048 06/21/21 0720 06/21/21 1133  GLUCAP 116* 188* 180* 138* 146*   HbA1C: No results for input(s): HGBA1C in the last 72 hours. Urine analysis:    Component Value Date/Time   COLORURINE YELLOW 06/18/2021 1406   APPEARANCEUR CLEAR 06/18/2021 1406   LABSPEC 1.007 06/18/2021 1406   PHURINE 5.0 06/18/2021 1406   GLUCOSEU NEGATIVE 06/18/2021 1406   HGBUR SMALL (A) 06/18/2021 1406   BILIRUBINUR NEGATIVE 06/18/2021 1406   KETONESUR NEGATIVE 06/18/2021 1406   PROTEINUR >=300 (A) 06/18/2021 1406   UROBILINOGEN 0.2 01/07/2015 1723   NITRITE NEGATIVE 06/18/2021 1406   LEUKOCYTESUR NEGATIVE 06/18/2021 1406   Sepsis Labs: @LABRCNTIP (procalcitonin:4,lacticidven:4) ) Recent Results (from the past 240  hour(s))  Resp Panel by RT-PCR (Flu A&B, Covid) Nasopharyngeal Swab     Status: None   Collection Time: 06/12/21 10:47 PM   Specimen: Nasopharyngeal Swab; Nasopharyngeal(NP) swabs in vial transport medium  Result Value Ref Range Status   SARS Coronavirus 2 by RT PCR NEGATIVE NEGATIVE Final    Comment: (NOTE) SARS-CoV-2 target nucleic acids are NOT DETECTED.  The SARS-CoV-2 RNA is generally detectable in upper respiratory specimens during the acute phase of infection. The lowest concentration of SARS-CoV-2 viral copies this assay can detect is 138 copies/mL. A negative result does not preclude SARS-Cov-2 infection and should not be used as the sole basis for treatment or other patient management decisions. A negative result may occur with  improper specimen collection/handling, submission of specimen other than nasopharyngeal swab, presence of viral mutation(s) within the areas targeted by this assay, and inadequate number of viral copies(<138 copies/mL). A negative result must be combined with clinical observations, patient history, and epidemiological information. The expected result is Negative.  Fact Sheet for Patients:  EntrepreneurPulse.com.au  Fact Sheet for Healthcare Providers:  IncredibleEmployment.be  This test is no t yet approved or cleared by the [place] FDA and  has been authorized for detection and/or diagnosis of SARS-CoV-2 by FDA under an Emergency Use Authorization (EUA). This EUA will remain  in effect (meaning this test can be used) for the duration of the COVID-19 declaration under Section 564(b)(1) of the Act, 21 U.S.C.section 360bbb-3(b)(1), unless the authorization is terminated  or revoked sooner.       Influenza A by PCR NEGATIVE NEGATIVE Final   Influenza B by PCR NEGATIVE NEGATIVE Final    Comment: (NOTE) The Xpert Xpress SARS-CoV-2/FLU/RSV plus assay is intended as an aid in the diagnosis of influenza from  Nasopharyngeal swab specimens and should not be used as a sole basis for treatment. Nasal washings and aspirates are unacceptable for Xpert Xpress SARS-CoV-2/FLU/RSV testing.  Fact Sheet for Patients: EntrepreneurPulse.com.au  Fact Sheet for Healthcare Providers: IncredibleEmployment.be  This test is not yet approved or cleared by the [place] FDA and has been authorized for detection and/or diagnosis of SARS-CoV-2 by FDA under an Emergency Use Authorization (EUA). This EUA will remain in effect (meaning this test can be used) for the duration of the COVID-19 declaration under Section 564(b)(1) of the Act, 21 U.S.C. section 360bbb-3(b)(1), unless the authorization is terminated or revoked.  Performed at [facility], [address], [place], [place] [address]   Urine Culture  Status: Abnormal   Collection Time: 06/18/21  2:07 PM   Specimen: Urine, Clean Catch  Result Value Ref Range Status   Specimen Description   Final    URINE, CLEAN CATCH Performed at [facility], [address], [place], [place] [address]    Special Requests   Final    NONE Performed at [facility], [address], [place], [place] [address]    Culture >=100,000 COLONIES/mL ESCHERICHIA COLI (A)  Final   Report Status 06/21/2021 FINAL  Final   Organism ID, Bacteria ESCHERICHIA COLI (A)  Final      Susceptibility   Escherichia coli - MIC*    AMPICILLIN 8 SENSITIVE Sensitive     CEFAZOLIN <=4 SENSITIVE Sensitive     CEFEPIME <=0.12 SENSITIVE Sensitive     CEFTRIAXONE <=0.25 SENSITIVE Sensitive     CIPROFLOXACIN <=0.25 SENSITIVE Sensitive     GENTAMICIN <=1 SENSITIVE Sensitive     IMIPENEM <=0.25 SENSITIVE Sensitive     NITROFURANTOIN <=16 SENSITIVE Sensitive     TRIMETH/SULFA <=20 SENSITIVE Sensitive     AMPICILLIN/SULBACTAM 4 SENSITIVE Sensitive     PIP/TAZO <=4 SENSITIVE Sensitive     * >=100,000 COLONIES/mL ESCHERICHIA COLI     Scheduled Meds:   darbepoetin (ARANESP) injection - NON-DIALYSIS  60 mcg Subcutaneous Q Sun-1800   feeding supplement  237 mL Oral BID BM   furosemide  80 mg Intravenous Q6H   heparin  5,000 Units Subcutaneous Q8H   insulin aspart  0-5 Units Subcutaneous QHS   insulin aspart  0-6 Units Subcutaneous TID WC   insulin glargine-yfgn  6 Units Subcutaneous QHS   melatonin  6 mg Oral QHS   metoprolol tartrate  50 mg Oral BID   rosuvastatin  10 mg Oral Daily   vitamin B-12  1,000 mcg Oral Daily   Continuous Infusions:  albumin human 25 g (06/21/21 0919)    Procedures/Studies: ECHOCARDIOGRAM COMPLETE  Result Date: 06/13/2021    ECHOCARDIOGRAM REPORT   Patient Name:   SAMORA JERNBERG Tetterton Date of Exam: 06/13/2021 Medical Rec #:  [id number]           Height:       67.0 in Accession #:    [id number]          Weight:       164.8 lb Date of Birth:  1996-03-14           BSA:          1.863 m Patient Age:    25 years            BP:           122/78 mmHg Patient Gender: F                   HR:           83 bpm. Exam Location:  [facility] Procedure: 2D Echo, Cardiac Doppler and Color Doppler Indications:    Edema  History:        Patient has no prior history of Echocardiogram examinations.                 Risk Factors:Hypertension and Diabetes.  Sonographer:    Wenda Low Referring Phys: [id number] ASIA [address]  1. Left ventricular ejection fraction, by estimation, is approximately 50%. There is low normal function. The left ventricle has no wall motion abnormalities. The left ventricular internal cavity size was normal in size. There is mild to moderate  asymmetric left ventricular hypertrophy of the posterior segment. Myocardium is densely echogenic. Also somewhat promiment trabeculation at the apex and along lateral wall.  2. Right ventricular systolic function is normal. The right ventricular size is normal. Mildly increased right ventricular wall thickness. There is normal pulmonary artery systolic pressure. The  estimated right ventricular systolic pressure is 29.9 mmHg.  3. A small pericardial effusion is present. The pericardial effusion is posterior to the left ventricle and localized near the right atrium.  4. The aortic valve is tricuspid. Aortic valve regurgitation is not visualized. No aortic stenosis is present. Aortic valve mean gradient measures 4.0 mmHg.  5. The mitral valve is grossly normal. Trivial mitral valve regurgitation.  6. Left atrial size was mildly dilated. Comparison(s): No prior Echocardiogram. When clinically feasible, consider cardiac MRI for further evaluation of possible infiltrative process such as amyloidosis and to exclude ventricular noncompaction. Could also order limited echocardiogram to obtain  strain imaging. FINDINGS  Left Ventricle: Left ventricular ejection fraction, by estimation, is 50%. The left ventricle has low normal function. The left ventricle has no regional wall motion abnormalities. The left ventricular internal cavity size was normal in size. There is moderate asymmetric left ventricular hypertrophy of the inferior, inferior and inferior segments. Left ventricular diastolic parameters were normal. Right Ventricle: The right ventricular size is normal. Mildly increased right ventricular wall thickness. Right ventricular systolic function is normal. There is normal pulmonary artery systolic pressure. The tricuspid regurgitant velocity is 2.70 m/s, and with an assumed right atrial pressure of 3 mmHg, the estimated right ventricular systolic pressure is 37.1 mmHg. Left Atrium: Left atrial size was mildly dilated. Right Atrium: Right atrial size was normal in size. Pericardium: A small pericardial effusion is present. The pericardial effusion is posterior to the left ventricle and localized near the right atrium. Mitral Valve: The mitral valve is grossly normal. Trivial mitral valve regurgitation. MV peak gradient, 6.2 mmHg. The mean mitral valve gradient is 3.0 mmHg.  Tricuspid Valve: The tricuspid valve is grossly normal. Tricuspid valve regurgitation is trivial. Aortic Valve: The aortic valve is tricuspid. Aortic valve regurgitation is not visualized. No aortic stenosis is present. Aortic valve mean gradient measures 4.0 mmHg. Aortic valve peak gradient measures 7.8 mmHg. Aortic valve area, by VTI measures 1.83 cm. Pulmonic Valve: The pulmonic valve was grossly normal. Pulmonic valve regurgitation is trivial. Aorta: The aortic root is normal in size and structure. IAS/Shunts: There is right bowing of the interatrial septum, suggestive of elevated left atrial pressure. No atrial level shunt detected by color flow Doppler.  LEFT VENTRICLE PLAX 2D LVIDd:         4.30 cm     Diastology LVIDs:         3.30 cm     LV e' medial:    9.03 cm/s LV PW:         1.40 cm     LV E/e' medial:  12.5 LV IVS:        0.90 cm     LV e' lateral:   9.57 cm/s LVOT diam:     1.90 cm     LV E/e' lateral: 11.8 LV SV:         60 LV SV Index:   32 LVOT Area:     2.84 cm  LV Volumes (MOD) LV vol d, MOD A2C: 53.2 ml LV vol d, MOD A4C: 88.1 ml LV vol s, MOD A2C: 31.0 ml LV vol s, MOD A4C: 43.8 ml LV  SV MOD A2C:     22.2 ml LV SV MOD A4C:     88.1 ml LV SV MOD BP:      34.9 ml RIGHT VENTRICLE RV Basal diam:  2.95 cm RV Mid diam:    2.20 cm RV S prime:     13.10 cm/s TAPSE (M-mode): 3.5 cm LEFT ATRIUM             Index        RIGHT ATRIUM           Index LA diam:        2.90 cm 1.56 cm/m   RA Area:     17.70 cm LA Vol (A2C):   48.6 ml 26.09 ml/m  RA Volume:   40.00 ml  21.48 ml/m LA Vol (A4C):   78.6 ml 42.20 ml/m LA Biplane Vol: 64.2 ml 34.47 ml/m  AORTIC VALVE                    PULMONIC VALVE AV Area (Vmax):    1.96 cm     PV Vmax:       0.68 m/s AV Area (Vmean):   1.98 cm     PV Peak grad:  1.9 mmHg AV Area (VTI):     1.83 cm AV Vmax:           140.00 cm/s AV Vmean:          90.300 cm/s AV VTI:            0.326 m AV Peak Grad:      7.8 mmHg AV Mean Grad:      4.0 mmHg LVOT Vmax:         96.60  cm/s LVOT Vmean:        63.200 cm/s LVOT VTI:          0.210 m LVOT/AV VTI ratio: 0.64  AORTA Ao Asc diam: 2.30 cm MITRAL VALVE                TRICUSPID VALVE MV Area (PHT): 4.36 cm     TR Peak grad:   29.2 mmHg MV Area VTI:   2.18 cm     TR Vmax:        270.00 cm/s MV Peak grad:  6.2 mmHg MV Mean grad:  3.0 mmHg     SHUNTS MV Vmax:       1.25 m/s     Systemic VTI:  0.21 m MV Vmean:      71.8 cm/s    Systemic Diam: 1.90 cm MV Decel Time: 174 msec MV E velocity: 113.00 cm/s MV A velocity: 102.00 cm/s MV E/A ratio:  1.11 Rozann Lesches MD Electronically signed by Rozann Lesches MD Signature Date/Time: 06/13/2021/2:42:35 PM    Final     Orson Eva, DO  [facility]  If 7PM-7AM, please contact night-coverage [email or web address] Password [facility] 06/21/2021, 3:47 PM   LOS: 9 days

## 2021-06-22 ENCOUNTER — Ambulatory Visit: Payer: 59 | Admitting: "Endocrinology

## 2021-06-22 LAB — RENAL FUNCTION PANEL
Albumin: 3.1 g/dL — ABNORMAL LOW (ref 3.5–5.0)
Anion gap: 11 (ref 5–15)
BUN: 51 mg/dL — ABNORMAL HIGH (ref 6–20)
CO2: 23 mmol/L (ref 22–32)
Calcium: 8.6 mg/dL — ABNORMAL LOW (ref 8.9–10.3)
Chloride: 105 mmol/L (ref 98–111)
Creatinine, Ser: 2.9 mg/dL — ABNORMAL HIGH (ref 0.44–1.00)
GFR, Estimated: 22 mL/min — ABNORMAL LOW (ref >60)
Glucose, Bld: 97 mg/dL (ref 70–99)
Phosphorus: 5 mg/dL — ABNORMAL HIGH (ref 2.5–4.6)
Potassium: 3.5 mmol/L (ref 3.5–5.1)
Sodium: 139 mmol/L (ref 135–145)

## 2021-06-22 LAB — GLUCOSE, CAPILLARY
Glucose-Capillary: 83 mg/dL (ref 70–99)
Glucose-Capillary: 134 mg/dL — ABNORMAL HIGH (ref 70–99)
Glucose-Capillary: 205 mg/dL — ABNORMAL HIGH (ref 70–99)
Glucose-Capillary: 238 mg/dL — ABNORMAL HIGH (ref 70–99)

## 2021-06-22 MED ORDER — TORSEMIDE 20 MG PO TABS: 60.0000 mg | ORAL_TABLET | Freq: Every day | ORAL | Status: DC

## 2021-06-22 MED ORDER — TORSEMIDE 20 MG PO TABS: 60.0000 mg | ORAL_TABLET | Freq: Two times a day (BID) | ORAL | Status: DC

## 2021-06-22 MED ORDER — TORSEMIDE 20 MG PO TABS
60.0000 mg | ORAL_TABLET | Freq: Two times a day (BID) | ORAL | Status: DC
  Administered 2021-06-22 – 2021-06-23 (×3): 60 mg via ORAL
  Filled 2021-06-22 (×3): qty 3

## 2021-06-22 NOTE — Progress Notes (Signed)
PROGRESS NOTE  [employer]:[id number] DOB: [birth year] DOA: 06/12/2021 PCP: Coral Spikes, DO  Brief History:  26 y.o. female with medical history significant of type 1 diabetes, autoimmune thyroiditis, diabetic neuropathy,  nephrotic syndrome, hypertension, asthma, and more presents ED with a chief complaint of BL leg weeping and edema x 1 week.   She notices blisters, come up, expand, and then burst.  She has no associated pain.  She reports that her legs are very heavy with fluid as well.  She is becoming more fatigued from trying to ambulate with this fluid overload.  Patient reports she also has dyspnea on exertion. She is not sure if she has orthopnea because she does not attempt to lay flat due to chronic abdominal pain.  Patient denies any f/c, n/v chest pain, palpitations.  Patient is following with nephrology.  In February 2021 patient had a vancomycin induced kidney injury, and she has been following with nephrology since that time.  She reports her last visit was last week.  She reports no medications were changed, and her primary complaint there was the leg swelling.  Patient reports the leg swelling has been going on since her hospitalization in February 2021 but has been gradually worsening over an unknown period.  In February 2021 she had negative DVT studies bilaterally.  The conclusion was that patient's third spacing of fluid was secondary to hypoalbuminemia.  She was started on protein shakes.  Mother at bedside reports she has not been using protein shakes.  Patient reports worsen lower extremity swelling.  Nephrology was consulted to assist with management.  IV lasix was started and titrated to 80 mg IV q 6 hours with slow improvement.  IV albumin also added as well as protein supplementation.  Wound care consult was placed for LE edema changes>>Wash legs with soap and water. Pat dry. Apply Sween Moisturizing Ointment. Beginning behind the toes and going to just below  the knees, spiral wrap kerlix, then 4 inch ace wrap. Perform daily.  Overall weight is slowly trending down, but now seems to now to plateau at 137 lbs.  Serum creatinine remains stable on IV lasix.       Assessment and Plan: * Acute renal failure superimposed on stage 4 chronic kidney disease ([place])- (present on admission) -Last creatinine around  2.3 -now new baseline 2.8-3.0 -remains fluid overloaded -continue IV lasix 80 mg q 6 hrs>>creatinine remains stable -setting of decreased effective arterial blood volume due to nephrotic syndrome and significant third spacing with ongoing home diuresis/ACE inhibitor management. -appreciate nephrology>>add IV albumin bid>>d/c 2/13 -hold ACEi -serum creatinine holding stable with IV lasix -2/10 standing weight 139 lbs>>137 (2/11)>>137.1 (2/12)>>136 (2/13) -needs strict fluid restriction--question adherence 2/13--change to torsemide 60 mg bid approx NEG 25 lbs for the admission   Hypokalemia repleted judiciously in pt with CKD4  Venous stasis dermatitis See pics of legs Not sure compression dressings will help>>ordered trial Significant lichenification -consult wound care nurse>>appreciate recommendations>> Wash legs with soap and water. Pat dry. Apply Sween Moisturizing Ointment. Beginning behind the toes and going to just below the knees, spiral wrap kerlix, then 4 inch ace wrap. Perform daily.   Anemia of chronic disease- (present on admission) -Hemoglobin down to 6.4 at time of admission -2 units PRBCs has been transfused this admission -Hgb remains stable after transfusion -IV iron and ESA therapy as per nephrology  -No signs of overt bleeding.  Peripheral edema Continue SCDs  Encourage elevation of legs Treating with albumin and lasix Continue to monitor  Hypoalbuminemia- (present on admission) -Patient has history of nephrotic syndrome but also does not maintain nutrient dense p.o. intake -Advised to use Nepro. -Restarted  IV albumin bid>>d/c 2/13  GERD (gastroesophageal reflux disease)- (present on admission) -Continue PPI.  Uncontrolled type 1 diabetes mellitus with hyperglycemia ([place])- (present on admission) -Continue current use of sliding scale insulin and basal insulin while inpatient. -decrease to sliding scale for renal patient due to hypoglycemia -decrease Semglee dose   Essential hypertension, benign- (present on admission) -d/c ACEi -Continue metoprolol. -slow improvement with diuressis   Status is: Inpatient Remains inpatient appropriate because: severity of illness;  remains fluid overloaded requiring IV lasix.  remains fluid overloaded         Family Communication:   mother updated at bedside 2/11   Consultants:  renal   Code Status:  FULL    DVT Prophylaxis:  [place] Heparin     Procedures: As Listed in Progress Note Above   Antibiotics: None            Subjective: Patient denies fevers, chills, headache, chest pain, dyspnea, nausea, vomiting, diarrhea, abdominal pain, dysuria,   Objective: Vitals:   06/22/21 0532 06/22/21 0620 06/22/21 1123 06/22/21 1405  BP: 139/88  (!) 142/93 (!) 158/105  Pulse: 91  87 84  Resp: 16  20 17   Temp: 98 F (36.7 C)   97.7 F (36.5 C)  TempSrc: Oral   Oral  SpO2: 98%  100% 100%  Weight:  61.7 kg    Height:        Intake/Output Summary (Last 24 hours) at 06/22/2021 1655 Last data filed at 06/22/2021 1500 Gross per 24 hour  Intake 656.3 ml  Output 2200 ml  Net -1543.7 ml   Weight change: -0.7 kg Exam:  General:  Pt is alert, follows commands appropriately, not in acute distress HEENT: No icterus, No thrush, No neck mass, [place]/AT Cardiovascular: RRR, S1/S2, no rubs, no gallops Respiratory: fine bibasilar crackles, no wheezing, no crackles, no rhonchi Abdomen: Soft/+BS, non tender, non distended, no guarding Extremities: 2 + LE edema, No lymphangitis, No petechiae, No rashes, no synovitis   Data Reviewed: I have  personally reviewed following labs and imaging studies Basic Metabolic Panel: Recent Labs  Lab 06/18/21 0619 06/19/21 0419 06/20/21 0436 06/21/21 0556 06/22/21 0456  NA 135 136 138 139 139  K 3.6 3.4* 3.2* 3.0* 3.5  CL 107 107 107 106 105  CO2 20* 21* 21* 21* 23  GLUCOSE 87 90 182* 159* 97  BUN 51* 51* 52* 51* 51*  CREATININE 3.13* 3.02* 2.98* 2.98* 2.90*  CALCIUM 7.9* 8.4* 8.5* 8.6* 8.6*  PHOS 5.5* 5.2* 4.9* 4.6 5.0*   Liver Function Tests: Recent Labs  Lab 06/18/21 0619 06/19/21 0419 06/20/21 0436 06/21/21 0556 06/22/21 0456  ALBUMIN 1.7* 2.3* 2.7* 2.9* 3.1*   No results for input(s): LIPASE, AMYLASE in the last 168 hours. No results for input(s): AMMONIA in the last 168 hours. Coagulation Profile: No results for input(s): INR, PROTIME in the last 168 hours. CBC: Recent Labs  Lab 06/16/21 0923 06/19/21 0419 06/20/21 0436  WBC 9.0 10.3 9.7  HGB 9.6* 8.3* 8.4*  HCT 28.7* 25.8* 26.5*  MCV 88.3 88.4 89.5  PLT 202 166 174   Cardiac Enzymes: No results for input(s): CKTOTAL, CKMB, CKMBINDEX, TROPONINI in the last 168 hours. BNP: Invalid input(s): POCBNP CBG: Recent Labs  Lab 06/21/21 1626 06/21/21 2226 06/22/21 0754  06/22/21 1110 06/22/21 1632  GLUCAP 136* 139* 83 134* 205*   HbA1C: No results for input(s): HGBA1C in the last 72 hours. Urine analysis:    Component Value Date/Time   COLORURINE YELLOW 06/18/2021 1406   APPEARANCEUR CLEAR 06/18/2021 1406   LABSPEC 1.007 06/18/2021 1406   PHURINE 5.0 06/18/2021 1406   GLUCOSEU NEGATIVE 06/18/2021 1406   HGBUR SMALL (A) 06/18/2021 1406   BILIRUBINUR NEGATIVE 06/18/2021 1406   KETONESUR NEGATIVE 06/18/2021 1406   PROTEINUR >=300 (A) 06/18/2021 1406   UROBILINOGEN 0.2 01/07/2015 1723   NITRITE NEGATIVE 06/18/2021 1406   LEUKOCYTESUR NEGATIVE 06/18/2021 1406   Sepsis Labs: @LABRCNTIP (procalcitonin:4,lacticidven:4) ) Recent Results (from the past 240 hour(s))  Resp Panel by RT-PCR (Flu A&B, Covid)  Nasopharyngeal Swab     Status: None   Collection Time: 06/12/21 10:47 PM   Specimen: Nasopharyngeal Swab; Nasopharyngeal(NP) swabs in vial transport medium  Result Value Ref Range Status   SARS Coronavirus 2 by RT PCR NEGATIVE NEGATIVE Final    Comment: (NOTE) SARS-CoV-2 target nucleic acids are NOT DETECTED.  The SARS-CoV-2 RNA is generally detectable in upper respiratory specimens during the acute phase of infection. The lowest concentration of SARS-CoV-2 viral copies this assay can detect is 138 copies/mL. A negative result does not preclude SARS-Cov-2 infection and should not be used as the sole basis for treatment or other patient management decisions. A negative result may occur with  improper specimen collection/handling, submission of specimen other than nasopharyngeal swab, presence of viral mutation(s) within the areas targeted by this assay, and inadequate number of viral copies(<138 copies/mL). A negative result must be combined with clinical observations, patient history, and epidemiological information. The expected result is Negative.  Fact Sheet for Patients:  EntrepreneurPulse.com.au  Fact Sheet for Healthcare Providers:  IncredibleEmployment.be  This test is no t yet approved or cleared by the [place] FDA and  has been authorized for detection and/or diagnosis of SARS-CoV-2 by FDA under an Emergency Use Authorization (EUA). This EUA will remain  in effect (meaning this test can be used) for the duration of the COVID-19 declaration under Section 564(b)(1) of the Act, 21 U.S.C.section 360bbb-3(b)(1), unless the authorization is terminated  or revoked sooner.       Influenza A by PCR NEGATIVE NEGATIVE Final   Influenza B by PCR NEGATIVE NEGATIVE Final    Comment: (NOTE) The Xpert Xpress SARS-CoV-2/FLU/RSV plus assay is intended as an aid in the diagnosis of influenza from Nasopharyngeal swab specimens and should not be  used as a sole basis for treatment. Nasal washings and aspirates are unacceptable for Xpert Xpress SARS-CoV-2/FLU/RSV testing.  Fact Sheet for Patients: EntrepreneurPulse.com.au  Fact Sheet for Healthcare Providers: IncredibleEmployment.be  This test is not yet approved or cleared by the [place] FDA and has been authorized for detection and/or diagnosis of SARS-CoV-2 by FDA under an Emergency Use Authorization (EUA). This EUA will remain in effect (meaning this test can be used) for the duration of the COVID-19 declaration under Section 564(b)(1) of the Act, 21 U.S.C. section 360bbb-3(b)(1), unless the authorization is terminated or revoked.  Performed at [facility], [address], [place], [place] [address]   Urine Culture     Status: Abnormal   Collection Time: 06/18/21  2:07 PM   Specimen: Urine, Clean Catch  Result Value Ref Range Status   Specimen Description   Final    URINE, CLEAN CATCH Performed at [facility], [address] [place], [place] [address]    Special  Requests   Final    NONE Performed at [facility], [address], [place], [place] [address]    Culture >=100,000 COLONIES/mL ESCHERICHIA COLI (A)  Final   Report Status 06/21/2021 FINAL  Final   Organism ID, Bacteria ESCHERICHIA COLI (A)  Final      Susceptibility   Escherichia coli - MIC*    AMPICILLIN 8 SENSITIVE Sensitive     CEFAZOLIN <=4 SENSITIVE Sensitive     CEFEPIME <=0.12 SENSITIVE Sensitive     CEFTRIAXONE <=0.25 SENSITIVE Sensitive     CIPROFLOXACIN <=0.25 SENSITIVE Sensitive     GENTAMICIN <=1 SENSITIVE Sensitive     IMIPENEM <=0.25 SENSITIVE Sensitive     NITROFURANTOIN <=16 SENSITIVE Sensitive     TRIMETH/SULFA <=20 SENSITIVE Sensitive     AMPICILLIN/SULBACTAM 4 SENSITIVE Sensitive     PIP/TAZO <=4 SENSITIVE Sensitive     * >=100,000 COLONIES/mL ESCHERICHIA COLI     Scheduled Meds:  darbepoetin (ARANESP) injection - NON-DIALYSIS  60 mcg  Subcutaneous Q Sun-1800   feeding supplement  237 mL Oral BID BM   heparin  5,000 Units Subcutaneous Q8H   insulin aspart  0-5 Units Subcutaneous QHS   insulin aspart  0-6 Units Subcutaneous TID WC   insulin glargine-yfgn  6 Units Subcutaneous QHS   melatonin  6 mg Oral QHS   metoprolol tartrate  50 mg Oral BID   rosuvastatin  10 mg Oral Daily   torsemide  60 mg Oral BID   vitamin B-12  1,000 mcg Oral Daily   Continuous Infusions:  Procedures/Studies: ECHOCARDIOGRAM COMPLETE  Result Date: 06/13/2021    ECHOCARDIOGRAM REPORT   Patient Name:   CAMIELLE SIZER Date of Exam: 06/13/2021 Medical Rec #:  [id number]           Height:       67.0 in Accession #:    [id number]          Weight:       164.8 lb Date of Birth:  11/26/1995           BSA:          1.863 m Patient Age:    25 years            BP:           122/78 mmHg Patient Gender: F                   HR:           83 bpm. Exam Location:  [facility] Procedure: 2D Echo, Cardiac Doppler and Color Doppler Indications:    Edema  History:        Patient has no prior history of Echocardiogram examinations.                 Risk Factors:Hypertension and Diabetes.  Sonographer:    Wenda Low Referring Phys: [id number] ASIA B Lucas  1. Left ventricular ejection fraction, by estimation, is approximately 50%. There is low normal function. The left ventricle has no wall motion abnormalities. The left ventricular internal cavity size was normal in size. There is mild to moderate asymmetric left ventricular hypertrophy of the posterior segment. Myocardium is densely echogenic. Also somewhat promiment trabeculation at the apex and along lateral wall.  2. Right ventricular systolic function is normal. The right ventricular size is normal. Mildly increased right ventricular wall thickness. There is normal pulmonary artery systolic pressure. The estimated right ventricular systolic pressure is 37.3 mmHg.  3. A small pericardial effusion is  present. The pericardial effusion is posterior to the left ventricle and localized near the right atrium.  4. The aortic valve is tricuspid. Aortic valve regurgitation is not visualized. No aortic stenosis is present. Aortic valve mean gradient measures 4.0 mmHg.  5. The mitral valve is grossly normal. Trivial mitral valve regurgitation.  6. Left atrial size was mildly dilated. Comparison(s): No prior Echocardiogram. When clinically feasible, consider cardiac MRI for further evaluation of possible infiltrative process such as amyloidosis and to exclude ventricular noncompaction. Could also order limited echocardiogram to obtain  strain imaging. FINDINGS  Left Ventricle: Left ventricular ejection fraction, by estimation, is 50%. The left ventricle has low normal function. The left ventricle has no regional wall motion abnormalities. The left ventricular internal cavity size was normal in size. There is moderate asymmetric left ventricular hypertrophy of the inferior, inferior and inferior segments. Left ventricular diastolic parameters were normal. Right Ventricle: The right ventricular size is normal. Mildly increased right ventricular wall thickness. Right ventricular systolic function is normal. There is normal pulmonary artery systolic pressure. The tricuspid regurgitant velocity is 2.70 m/s, and with an assumed right atrial pressure of 3 mmHg, the estimated right ventricular systolic pressure is 93.7 mmHg. Left Atrium: Left atrial size was mildly dilated. Right Atrium: Right atrial size was normal in size. Pericardium: A small pericardial effusion is present. The pericardial effusion is posterior to the left ventricle and localized near the right atrium. Mitral Valve: The mitral valve is grossly normal. Trivial mitral valve regurgitation. MV peak gradient, 6.2 mmHg. The mean mitral valve gradient is 3.0 mmHg. Tricuspid Valve: The tricuspid valve is grossly normal. Tricuspid valve regurgitation is trivial. Aortic  Valve: The aortic valve is tricuspid. Aortic valve regurgitation is not visualized. No aortic stenosis is present. Aortic valve mean gradient measures 4.0 mmHg. Aortic valve peak gradient measures 7.8 mmHg. Aortic valve area, by VTI measures 1.83 cm. Pulmonic Valve: The pulmonic valve was grossly normal. Pulmonic valve regurgitation is trivial. Aorta: The aortic root is normal in size and structure. IAS/Shunts: There is right bowing of the interatrial septum, suggestive of elevated left atrial pressure. No atrial level shunt detected by color flow Doppler.  LEFT VENTRICLE PLAX 2D LVIDd:         4.30 cm     Diastology LVIDs:         3.30 cm     LV e' medial:    9.03 cm/s LV PW:         1.40 cm     LV E/e' medial:  12.5 LV IVS:        0.90 cm     LV e' lateral:   9.57 cm/s LVOT diam:     1.90 cm     LV E/e' lateral: 11.8 LV SV:         60 LV SV Index:   32 LVOT Area:     2.84 cm  LV Volumes (MOD) LV vol d, MOD A2C: 53.2 ml LV vol d, MOD A4C: 88.1 ml LV vol s, MOD A2C: 31.0 ml LV vol s, MOD A4C: 43.8 ml LV SV MOD A2C:     22.2 ml LV SV MOD A4C:     88.1 ml LV SV MOD BP:      34.9 ml RIGHT VENTRICLE RV Basal diam:  2.95 cm RV Mid diam:    2.20 cm RV S prime:     13.10 cm/s TAPSE (M-mode): 3.5 cm LEFT  ATRIUM             Index        RIGHT ATRIUM           Index LA diam:        2.90 cm 1.56 cm/m   RA Area:     17.70 cm LA Vol (A2C):   48.6 ml 26.09 ml/m  RA Volume:   40.00 ml  21.48 ml/m LA Vol (A4C):   78.6 ml 42.20 ml/m LA Biplane Vol: 64.2 ml 34.47 ml/m  AORTIC VALVE                    PULMONIC VALVE AV Area (Vmax):    1.96 cm     PV Vmax:       0.68 m/s AV Area (Vmean):   1.98 cm     PV Peak grad:  1.9 mmHg AV Area (VTI):     1.83 cm AV Vmax:           140.00 cm/s AV Vmean:          90.300 cm/s AV VTI:            0.326 m AV Peak Grad:      7.8 mmHg AV Mean Grad:      4.0 mmHg LVOT Vmax:         96.60 cm/s LVOT Vmean:        63.200 cm/s LVOT VTI:          0.210 m LVOT/AV VTI ratio: 0.64  AORTA Ao Asc diam:  2.30 cm MITRAL VALVE                TRICUSPID VALVE MV Area (PHT): 4.36 cm     TR Peak grad:   29.2 mmHg MV Area VTI:   2.18 cm     TR Vmax:        270.00 cm/s MV Peak grad:  6.2 mmHg MV Mean grad:  3.0 mmHg     SHUNTS MV Vmax:       1.25 m/s     Systemic VTI:  0.21 m MV Vmean:      71.8 cm/s    Systemic Diam: 1.90 cm MV Decel Time: 174 msec MV E velocity: 113.00 cm/s MV A velocity: 102.00 cm/s MV E/A ratio:  1.11 Rozann Lesches MD Electronically signed by Rozann Lesches MD Signature Date/Time: 06/13/2021/2:42:35 PM    Final     Orson Eva, DO  [facility]  If 7PM-7AM, please contact night-coverage [email or web address] Password [email or web address] 06/22/2021, 4:55 PM   LOS: 10 days

## 2021-06-22 NOTE — Progress Notes (Signed)
Subjective:    Creatinine stable around 2.9. Has diuresed well over the weekend, now down 13 kg on Lasix 80 mg IV every 6 hours; more than 2.7 L yesterday UOP Potassium improved to 3.5; she tells me that she poorly tolerates pills of that size Edema seems to be much improved, not readily appreciable on exam today  Objective Vital signs in last 24 hours: Vitals:   06/21/21 1311 06/21/21 2228 06/22/21 0532 06/22/21 0620  BP: (!) 156/99 (!) 162/109 139/88   Pulse: 81 87 91   Resp: 17 18 16    Temp: 98.6 F (37 C) 97.8 F (36.6 C) 98 F (36.7 C)   TempSrc:  Oral Oral   SpO2: 100% 100% 98%   Weight:    61.7 kg  Height:       Weight change: -0.7 kg  Intake/Output Summary (Last 24 hours) at 06/22/2021 1497 Last data filed at 06/22/2021 0263 Gross per 24 hour  Intake 734.29 ml  Output 2300 ml  Net -1565.71 ml     Assessment/ Plan: Pt is a 26 y.o. yo female with biopsy proven diabetic kidney disease- nephrotic syndrome who was admitted on 06/12/2021 with volume overload    Assessment/Plan: 1. Volume overload-  due to her nephrotic syndrome and CKD-  difficult problem to treat.  Has done better in the past 48 hours.  Seems to be approaching euvolemia.  Transition to torsemide 60 mg by mouth twice daily.  If does well and is stable to further improve volume status tomorrow probably could discharge on this regimen.  We will stop albumin at this time.  2. 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  3. Anemia- due to CKD-   not helping the third spacing-  on IV iron and ESA -  also given 2 units of blood this admission.  Hgb 8.4,  4. Metabolic acidosis-  due to CKD-  hesitate to treat with oral  bicarb given sodium load, stable and no indication for alkali therapy at the current time    [place]: Basic Metabolic Panel: Recent Labs  Lab 06/20/21 0436 06/21/21 0556 06/22/21 0456   NA 138 139 139  K 3.2* 3.0* 3.5  CL 107 106 105  CO2 21* 21* 23  GLUCOSE 182* 159* 97  BUN 52* 51* 51*  CREATININE 2.98* 2.98* 2.90*  CALCIUM 8.5* 8.6* 8.6*  PHOS 4.9* 4.6 5.0*    Liver Function Tests: Recent Labs  Lab 06/20/21 0436 06/21/21 0556 06/22/21 0456  ALBUMIN 2.7* 2.9* 3.1*    No results for input(s): LIPASE, AMYLASE in the last 168 hours. No results for input(s): AMMONIA in the last 168 hours. CBC: Recent Labs  Lab 06/16/21 0923 06/19/21 0419 06/20/21 0436  WBC 9.0 10.3 9.7  HGB 9.6* 8.3* 8.4*  HCT 28.7* 25.8* 26.5*  MCV 88.3 88.4 89.5  PLT 202 166 174    Cardiac Enzymes: No results for input(s): CKTOTAL, CKMB, CKMBINDEX, TROPONINI in the last 168 hours. CBG: Recent Labs  Lab 06/21/21 0720 06/21/21 1133 06/21/21 1626 06/21/21 2226 06/22/21 0754  GLUCAP 138* 146* 136* 139* 83     Iron Studies: No results for input(s): IRON, TIBC, TRANSFERRIN, FERRITIN in the last 72 hours. Studies/Results: No results found. Medications: Infusions:  albumin human 25 g (06/21/21 2228)     Scheduled Medications:  darbepoetin (ARANESP) injection - NON-DIALYSIS  60 mcg  Subcutaneous Q Sun-1800   feeding supplement  237 mL Oral BID BM   heparin  5,000 Units Subcutaneous Q8H   insulin aspart  0-5 Units Subcutaneous QHS   insulin aspart  0-6 Units Subcutaneous TID WC   insulin glargine-yfgn  6 Units Subcutaneous QHS   melatonin  6 mg Oral QHS   metoprolol tartrate  50 mg Oral BID   rosuvastatin  10 mg Oral Daily   torsemide  60 mg Oral Daily   vitamin B-12  1,000 mcg Oral Daily    have reviewed scheduled and prn medications.  Physical Exam: General: eating breakfast  Heart: RRR Lungs: dec BS at bases Abdomen: obese, soft, non tender Extremities: d much improved edema; in [place] boots    06/22/2021,9:36 AM  LOS: 10 days

## 2021-06-23 LAB — RENAL FUNCTION PANEL
Albumin: 2.8 g/dL — ABNORMAL LOW (ref 3.5–5.0)
Anion gap: 12 (ref 5–15)
BUN: 53 mg/dL — ABNORMAL HIGH (ref 6–20)
CO2: 20 mmol/L — ABNORMAL LOW (ref 22–32)
Calcium: 8.6 mg/dL — ABNORMAL LOW (ref 8.9–10.3)
Chloride: 108 mmol/L (ref 98–111)
Creatinine, Ser: 2.99 mg/dL — ABNORMAL HIGH (ref 0.44–1.00)
GFR, Estimated: 21 mL/min — ABNORMAL LOW (ref >60)
Glucose, Bld: 72 mg/dL (ref 70–99)
Phosphorus: 5.5 mg/dL — ABNORMAL HIGH (ref 2.5–4.6)
Potassium: 3.3 mmol/L — ABNORMAL LOW (ref 3.5–5.1)
Sodium: 140 mmol/L (ref 135–145)

## 2021-06-23 LAB — GLUCOSE, CAPILLARY
Glucose-Capillary: 53 mg/dL — ABNORMAL LOW (ref 70–99)
Glucose-Capillary: 51 mg/dL — ABNORMAL LOW (ref 70–99)
Glucose-Capillary: 85 mg/dL (ref 70–99)

## 2021-06-23 MED ORDER — POTASSIUM CHLORIDE 20 MEQ PO PACK
40.0000 meq | PACK | Freq: Every day | ORAL | Status: DC
  Administered 2021-06-23: 40 meq via ORAL
  Filled 2021-06-23: qty 2

## 2021-06-23 MED ORDER — TORSEMIDE 100 MG PO TABS: 100.0000 mg | ORAL_TABLET | Freq: Two times a day (BID) | ORAL | Status: DC

## 2021-06-23 MED ORDER — TORSEMIDE 60 MG PO TABS: 60.0000 mg | ORAL_TABLET | Freq: Two times a day (BID) | ORAL | 1 refills | Status: DC

## 2021-06-23 MED ORDER — POTASSIUM CHLORIDE 20 MEQ PO PACK: 40.0000 meq | PACK | Freq: Every day | ORAL | 1 refills | Status: DC

## 2021-06-23 MED ORDER — INSULIN GLARGINE-YFGN 100 UNIT/ML ~~LOC~~ SOLN
5.0000 [IU] | Freq: Every day | SUBCUTANEOUS | Status: DC
  Filled 2021-06-23: qty 0.05

## 2021-06-23 MED ORDER — ENSURE ENLIVE PO LIQD: 237.0000 mL | Freq: Two times a day (BID) | ORAL | 12 refills | Status: DC

## 2021-06-23 MED ORDER — TORSEMIDE 20 MG PO TABS: 60.0000 mg | ORAL_TABLET | Freq: Two times a day (BID) | ORAL | Status: DC

## 2021-06-23 NOTE — Progress Notes (Signed)
Inpatient Diabetes Program Recommendations  AACE/ADA: New Consensus Statement on Inpatient Glycemic Control   Target Ranges:  Prepandial:   less than 140 mg/dL      Peak postprandial:   less than 180 mg/dL (1-2 hours)      Critically ill patients:  140 - 180 mg/dL    Latest Reference Range & Units 06/23/21 07:24  Glucose-Capillary 70 - 99 mg/dL 53 (L)    Latest Reference Range & Units 06/22/21 07:54 06/22/21 11:10 06/22/21 16:32 06/22/21 20:37 06/22/21 22:52  Glucose-Capillary 70 - 99 mg/dL 83 134 (H) 205 (H)  Novolog 2 units 238 (H)     Novolog 2 units  Semglee 6 units   Review of Glycemic Control  Diabetes history: DM1 Outpatient Diabetes medications: Semglee 14 units daily Current orders for Inpatient glycemic control: [place] 6 units QHS, Novolog 0-6 units TID with meals, Novolog 0-5 units QHS  Inpatient Diabetes Program Recommendations:    Insulin: Please consider decreasing Semglee to 5 units QHS. If patient is eating at least 50% of meals, may want to consider ordering Novolog 1 units TID with meals with hold parameters (Do NOT give if CBG < 80.Do NOT give if meal intake is less than 50 %).  NOTE: Fasting glucose 53 mg/dl today. Patient received Novolog 2 units at 22:52 for correction of glucose obtained at 20:37 06/22/21. Insulin correction should be given within 60 minutes of CBG.   Thanks, Barnie Alderman, RN, MSN, CDE Diabetes Coordinator Inpatient Diabetes Program [phone] (Team Pager from 8am to 5pm)

## 2021-06-23 NOTE — Progress Notes (Signed)
Subjective:    Stable SCr, K 3.3 Rec Torsemide 60 BID yesterday 1.1L + at least 1 void recorded, weight is 135lb today on repeat No sig change to edema which is markedly improved  Objective Vital signs in last 24 hours: Vitals:   06/22/21 1123 06/22/21 1405 06/22/21 2035 06/23/21 0500  BP: (!) 142/93 (!) 158/105 (!) 150/90 (!) 148/89  Pulse: 87 84 99   Resp: 20 17  18   Temp:  97.7 F (36.5 C) 98.5 F (36.9 C) 98 F (36.7 C)  TempSrc:  Oral Oral Oral  SpO2: 100% 100% 96% 99%  Weight:    66.7 kg  Height:       Weight change: 5 kg  Intake/Output Summary (Last 24 hours) at 06/23/2021 3009 Last data filed at 06/22/2021 1923 Gross per 24 hour  Intake 600 ml  Output 1100 ml  Net -500 ml     Assessment/ Plan: Pt is a 26 y.o. yo female with biopsy proven diabetic kidney disease- nephrotic syndrome who was admitted on 06/12/2021 with volume overload    Assessment/Plan: 1. Volume overload-  due to her nephrotic syndrome and CKD-  difficult problem to treat.  Has done better in the past 48 hours.  Seems to be at or close to euvolemia.  Cont torsemide 60 mg by mouth twice daily add on KCl 40/d.  OK for DC and  close f/u at our office with repeat labs, I will arrange.    2. 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  3. Anemia- due to CKD-   not helping the third spacing-  on IV iron and ESA -  also given 2 units of blood this admission.  Hgb 8.4, will cont to follow as outpt and consider outpt ESA therapy  4. Metabolic acidosis-  due to CKD-  hesitate to treat with oral  bicarb given sodium load, stable and no indication for alkali therapy at the current time    [place]: Basic Metabolic Panel: Recent Labs  Lab 06/21/21 0556 06/22/21 0456 06/23/21 0546  NA 139 139 140  K 3.0* 3.5 3.3*  CL 106 105 108  CO2 21* 23 20*  GLUCOSE 159* 97 72  BUN 51* 51* 53*  CREATININE  2.98* 2.90* 2.99*  CALCIUM 8.6* 8.6* 8.6*  PHOS 4.6 5.0* 5.5*    Liver Function Tests: Recent Labs  Lab 06/21/21 0556 06/22/21 0456 06/23/21 0546  ALBUMIN 2.9* 3.1* 2.8*    No results for input(s): LIPASE, AMYLASE in the last 168 hours. No results for input(s): AMMONIA in the last 168 hours. CBC: Recent Labs  Lab 06/16/21 0923 06/19/21 0419 06/20/21 0436  WBC 9.0 10.3 9.7  HGB 9.6* 8.3* 8.4*  HCT 28.7* 25.8* 26.5*  MCV 88.3 88.4 89.5  PLT 202 166 174    Cardiac Enzymes: No results for input(s): CKTOTAL, CKMB, CKMBINDEX, TROPONINI in the last 168 hours. CBG: Recent Labs  Lab 06/22/21 1110 06/22/21 1632 06/22/21 2037 06/23/21 0724 06/23/21 0756  GLUCAP 134* 205* 238* 53* 51*     Iron Studies: No results for input(s): IRON, TIBC, TRANSFERRIN, FERRITIN in the last 72 hours. Studies/Results: No results found. Medications: Infusions:     Scheduled Medications:  darbepoetin (ARANESP) injection - NON-DIALYSIS  60 mcg Subcutaneous Q Sun-1800   feeding supplement  237 mL Oral BID BM   heparin  5,000 Units Subcutaneous  Q8H   insulin aspart  0-5 Units Subcutaneous QHS   insulin aspart  0-6 Units Subcutaneous TID WC   insulin glargine-yfgn  5 Units Subcutaneous QHS   melatonin  6 mg Oral QHS   metoprolol tartrate  50 mg Oral BID   potassium chloride  40 mEq Oral Daily   rosuvastatin  10 mg Oral Daily   torsemide  100 mg Oral BID   vitamin B-12  1,000 mcg Oral Daily    have reviewed scheduled and prn medications.  Physical Exam: General: eating breakfast  Heart: RRR Lungs: dec BS at bases Abdomen: obese, soft, non tender Extremities: d much improved edema; in [place] boots    06/23/2021,9:03 AM  LOS: 11 days

## 2021-06-23 NOTE — Plan of Care (Signed)

## 2021-06-23 NOTE — Discharge Summary (Signed)
Physician Discharge Summary   Patient: Lindsey Murillo MRN: [id number] DOB: December 25, 1995  Admit date:     06/12/2021  Discharge date: 06/23/21  Discharge Physician: Shanon Brow Shakeitha Umbaugh   PCP: Coral Spikes, DO   Recommendations at discharge:   Please follow up with primary care provider within 1-2 weeks  Please repeat BMP in one week  Please follow up on/with nephrology 1-2 weeks    Hospital Course: 26 y.o. female with medical history significant of type 1 diabetes, autoimmune thyroiditis, diabetic neuropathy,  nephrotic syndrome, hypertension, asthma, and more presents ED with a chief complaint of BL leg weeping and edema x 1 week.   She notices blisters, come up, expand, and then burst.  She has no associated pain.  She reports that her legs are very heavy with fluid as well.  She is becoming more fatigued from trying to ambulate with this fluid overload.  Patient reports she also has dyspnea on exertion. She is not sure if she has orthopnea because she does not attempt to lay flat due to chronic abdominal pain.  Patient denies any f/c, n/v chest pain, palpitations.  Patient is following with nephrology.  In February 2021 patient had a vancomycin induced kidney injury, and she has been following with nephrology since that time.  She reports her last visit was last week.  She reports no medications were changed, and her primary complaint there was the leg swelling.  Patient reports the leg swelling has been going on since her hospitalization in February 2021 but has been gradually worsening over an unknown period.  In February 2021 she had negative DVT studies bilaterally.  The conclusion was that patient's third spacing of fluid was secondary to hypoalbuminemia.  She was started on protein shakes.  Mother at bedside reports she has not been using protein shakes.  Patient reports worsen lower extremity swelling.  Nephrology was consulted to assist with management.  IV lasix was started and titrated to 80 mg  IV q 6 hours with slow improvement.  IV albumin also added as well as protein supplementation.  Wound care consult was placed for LE edema changes>>Wash legs with soap and water. Pat dry. Apply Sween Moisturizing Ointment. Beginning behind the toes and going to just below the knees, spiral wrap kerlix, then 4 inch ace wrap. Perform daily.  Overall weight is slowly trending down, but now seems to now to plateau at 137 lbs.  Serum creatinine remains stable on IV lasix.  She continued to have good UO with stable renal function.  She was cleared by renal to d/c home on 06/23/21 with torsemide 60 mg bid and Klor-con 40 meQ daily.  Discharge weight 135 lbs.  She will follow up with nephrology in 1 week for renal panel.   She was educated extensively regarind fluid and salt restriction after dc.  Assessment and Plan: * Acute renal failure superimposed on stage 4 chronic kidney disease ([place])- (present on admission) -Last creatinine around  2.3 -now new baseline 2.8-3.0 -remains fluid overloaded -continue IV lasix 80 mg q 6 hrs>>creatinine remains stable -setting of decreased effective arterial blood volume due to nephrotic syndrome and significant third spacing with ongoing home diuresis/ACE inhibitor management. -appreciate nephrology>>add IV albumin bid>>d/c 2/13 -hold ACEi -serum creatinine holding stable with IV lasix -2/10 standing weight 139 lbs>>137 (2/11)>>137.1 (2/12)>>136 (2/13)>>135 lbs on 06/23/21 -needs strict fluid restriction--question adherence 2/13--change to torsemide 60 mg bid approx NEG 276 lbs for the admission 135 lbs on day of d/c D/c  home with torsemide 60 mg bid and KCl 40 daily   Hypokalemia repleted judiciously in pt with CKD4  Venous stasis dermatitis See pics of legs Not sure compression dressings will help>>ordered trial Significant lichenification -consult wound care nurse>>appreciate recommendations>> Wash legs with soap and water. Pat dry. Apply Sween Moisturizing  Ointment. Beginning behind the toes and going to just below the knees, spiral wrap kerlix, then 4 inch ace wrap. Perform daily.   Anemia of chronic disease- (present on admission) -Hemoglobin down to 6.4 at time of admission -2 units PRBCs has been transfused this admission -Hgb remains stable after transfusion -IV iron and ESA therapy as per nephrology  -No signs of overt bleeding.  Peripheral edema Continue SCDs Encourage elevation of legs Treating with albumin and lasix Continue to monitor  Hypoalbuminemia- (present on admission) -Patient has history of nephrotic syndrome but also does not maintain nutrient dense p.o. intake -Advised to use Nepro. -Restarted IV albumin bid>>d/c 2/13  GERD (gastroesophageal reflux disease)- (present on admission) -Continue PPI.  Uncontrolled type 1 diabetes mellitus with hyperglycemia ([place])- (present on admission) -Continue current use of sliding scale insulin and basal insulin while inpatient. -decrease to sliding scale for renal patient due to hypoglycemia -decrease Semglee dose   Essential hypertension, benign- (present on admission) -d/c ACEi -Continue metoprolol. -slow improvement with diuressis           Consultants: renal Procedures performed: none  Disposition: Home Diet recommendation:  Renal diet  DISCHARGE MEDICATION: Allergies as of 06/23/2021   No Known Allergies      Medication List     STOP taking these medications    furosemide 40 MG tablet Commonly known as: LASIX   lisinopril 20 MG tablet Commonly known as: ZESTRIL       TAKE these medications    acetaminophen 325 MG tablet Commonly known as: TYLENOL Take 650 mg by mouth every 6 (six) hours as needed (for pain).   blood glucose meter kit and supplies Kit Dispense based on patient and insurance preference. Use up to four times daily as directed.   feeding supplement Liqd Take 237 mLs by mouth 2 (two) times daily between meals.    FreeStyle Libre 2 Reader Kerrin Mo As directed   [employer] 2 Sensor Misc 1 Piece by Does not apply route every 14 (fourteen) days.   insulin glargine-yfgn 100 UNIT/ML injection Commonly known as: SEMGLEE 14 Units daily.   Insulin Pen Needle 31G X 4 MM Misc Use as directed to administer insulin.   metoprolol tartrate 50 MG tablet Commonly known as: LOPRESSOR Take 50 mg by mouth 2 (two) times daily.   potassium chloride 20 MEQ packet Commonly known as: KLOR-CON Take 40 mEq by mouth daily.   rosuvastatin 10 MG tablet Commonly known as: Crestor Take 1 tablet (10 mg total) by mouth daily.   Torsemide 60 MG Tabs Take 60 mg by mouth 2 (two) times daily.   vitamin B-12 1000 MCG tablet Commonly known as: CYANOCOBALAMIN Take 1,000 mcg by mouth daily.        Follow-up Information     [place] Follow up in 1 week(s).   Why: We will call with details for follow up appointment and lab check Contact information: [address] [place] [address] [phone]                 Discharge Exam: Filed Weights   06/21/21 0500 06/22/21 0620 06/23/21 0500  Weight: 62.4 kg 61.7 kg 66.7 kg  HEENT:  [place]/AT, No thrush, no icterus CV:  RRR, no rub, no S3, no S4 Lung:  CTA, no wheeze, no rhonchi Abd:  soft/+BS, NT Ext:  2 + LE edema, no lymphangitis, no synovitis, no rash   Condition at discharge: stable  The results of significant diagnostics from this hospitalization (including imaging, microbiology, ancillary and laboratory) are listed below for reference.   Imaging Studies: ECHOCARDIOGRAM COMPLETE  Result Date: 06/13/2021    ECHOCARDIOGRAM REPORT   Patient Name:   TAYEN NARANG Date of Exam: 06/13/2021 Medical Rec #:  [id number]           Height:       67.0 in Accession #:    [id number]          Weight:       164.8 lb Date of Birth:  05-20-1995           BSA:          1.863 m Patient Age:    25 years            BP:           122/78 mmHg Patient Gender: F                    HR:           83 bpm. Exam Location:  [facility] Procedure: 2D Echo, Cardiac Doppler and Color Doppler Indications:    Edema  History:        Patient has no prior history of Echocardiogram examinations.                 Risk Factors:Hypertension and Diabetes.  Sonographer:    Wenda Low Referring Phys: [id number] ASIA B [place]  1. Left ventricular ejection fraction, by estimation, is approximately 50%. There is low normal function. The left ventricle has no wall motion abnormalities. The left ventricular internal cavity size was normal in size. There is mild to moderate asymmetric left ventricular hypertrophy of the posterior segment. Myocardium is densely echogenic. Also somewhat promiment trabeculation at the apex and along lateral wall.  2. Right ventricular systolic function is normal. The right ventricular size is normal. Mildly increased right ventricular wall thickness. There is normal pulmonary artery systolic pressure. The estimated right ventricular systolic pressure is 50.2 mmHg.  3. A small pericardial effusion is present. The pericardial effusion is posterior to the left ventricle and localized near the right atrium.  4. The aortic valve is tricuspid. Aortic valve regurgitation is not visualized. No aortic stenosis is present. Aortic valve mean gradient measures 4.0 mmHg.  5. The mitral valve is grossly normal. Trivial mitral valve regurgitation.  6. Left atrial size was mildly dilated. Comparison(s): No prior Echocardiogram. When clinically feasible, consider cardiac MRI for further evaluation of possible infiltrative process such as amyloidosis and to exclude ventricular noncompaction. Could also order limited echocardiogram to obtain  strain imaging. FINDINGS  Left Ventricle: Left ventricular ejection fraction, by estimation, is 50%. The left ventricle has low normal function. The left ventricle has no regional wall motion abnormalities. The left ventricular internal  cavity size was normal in size. There is moderate asymmetric left ventricular hypertrophy of the inferior, inferior and inferior segments. Left ventricular diastolic parameters were normal. Right Ventricle: The right ventricular size is normal. Mildly increased right ventricular wall thickness. Right ventricular systolic function is normal. There is normal pulmonary artery systolic pressure. The tricuspid regurgitant velocity is 2.70 m/s, and with an  assumed right atrial pressure of 3 mmHg, the estimated right ventricular systolic pressure is 16.0 mmHg. Left Atrium: Left atrial size was mildly dilated. Right Atrium: Right atrial size was normal in size. Pericardium: A small pericardial effusion is present. The pericardial effusion is posterior to the left ventricle and localized near the right atrium. Mitral Valve: The mitral valve is grossly normal. Trivial mitral valve regurgitation. MV peak gradient, 6.2 mmHg. The mean mitral valve gradient is 3.0 mmHg. Tricuspid Valve: The tricuspid valve is grossly normal. Tricuspid valve regurgitation is trivial. Aortic Valve: The aortic valve is tricuspid. Aortic valve regurgitation is not visualized. No aortic stenosis is present. Aortic valve mean gradient measures 4.0 mmHg. Aortic valve peak gradient measures 7.8 mmHg. Aortic valve area, by VTI measures 1.83 cm. Pulmonic Valve: The pulmonic valve was grossly normal. Pulmonic valve regurgitation is trivial. Aorta: The aortic root is normal in size and structure. IAS/Shunts: There is right bowing of the interatrial septum, suggestive of elevated left atrial pressure. No atrial level shunt detected by color flow Doppler.  LEFT VENTRICLE PLAX 2D LVIDd:         4.30 cm     Diastology LVIDs:         3.30 cm     LV e' medial:    9.03 cm/s LV PW:         1.40 cm     LV E/e' medial:  12.5 LV IVS:        0.90 cm     LV e' lateral:   9.57 cm/s LVOT diam:     1.90 cm     LV E/e' lateral: 11.8 LV SV:         60 LV SV Index:   32 LVOT  Area:     2.84 cm  LV Volumes (MOD) LV vol d, MOD A2C: 53.2 ml LV vol d, MOD A4C: 88.1 ml LV vol s, MOD A2C: 31.0 ml LV vol s, MOD A4C: 43.8 ml LV SV MOD A2C:     22.2 ml LV SV MOD A4C:     88.1 ml LV SV MOD BP:      34.9 ml RIGHT VENTRICLE RV Basal diam:  2.95 cm RV Mid diam:    2.20 cm RV S prime:     13.10 cm/s TAPSE (M-mode): 3.5 cm LEFT ATRIUM             Index        RIGHT ATRIUM           Index LA diam:        2.90 cm 1.56 cm/m   RA Area:     17.70 cm LA Vol (A2C):   48.6 ml 26.09 ml/m  RA Volume:   40.00 ml  21.48 ml/m LA Vol (A4C):   78.6 ml 42.20 ml/m LA Biplane Vol: 64.2 ml 34.47 ml/m  AORTIC VALVE                    PULMONIC VALVE AV Area (Vmax):    1.96 cm     PV Vmax:       0.68 m/s AV Area (Vmean):   1.98 cm     PV Peak grad:  1.9 mmHg AV Area (VTI):     1.83 cm AV Vmax:           140.00 cm/s AV Vmean:          90.300 cm/s AV VTI:  0.326 m AV Peak Grad:      7.8 mmHg AV Mean Grad:      4.0 mmHg LVOT Vmax:         96.60 cm/s LVOT Vmean:        63.200 cm/s LVOT VTI:          0.210 m LVOT/AV VTI ratio: 0.64  AORTA Ao Asc diam: 2.30 cm MITRAL VALVE                TRICUSPID VALVE MV Area (PHT): 4.36 cm     TR Peak grad:   29.2 mmHg MV Area VTI:   2.18 cm     TR Vmax:        270.00 cm/s MV Peak grad:  6.2 mmHg MV Mean grad:  3.0 mmHg     SHUNTS MV Vmax:       1.25 m/s     Systemic VTI:  0.21 m MV Vmean:      71.8 cm/s    Systemic Diam: 1.90 cm MV Decel Time: 174 msec MV E velocity: 113.00 cm/s MV A velocity: 102.00 cm/s MV E/A ratio:  1.11 Rozann Lesches MD Electronically signed by Rozann Lesches MD Signature Date/Time: 06/13/2021/2:42:35 PM    Final     Microbiology: Results for orders placed or performed during the hospital encounter of 06/12/21  Resp Panel by RT-PCR (Flu A&B, Covid) Nasopharyngeal Swab     Status: None   Collection Time: 06/12/21 10:47 PM   Specimen: Nasopharyngeal Swab; Nasopharyngeal(NP) swabs in vial transport medium  Result Value Ref Range Status   SARS  Coronavirus 2 by RT PCR NEGATIVE NEGATIVE Final    Comment: (NOTE) SARS-CoV-2 target nucleic acids are NOT DETECTED.  The SARS-CoV-2 RNA is generally detectable in upper respiratory specimens during the acute phase of infection. The lowest concentration of SARS-CoV-2 viral copies this assay can detect is 138 copies/mL. A negative result does not preclude SARS-Cov-2 infection and should not be used as the sole basis for treatment or other patient management decisions. A negative result may occur with  improper specimen collection/handling, submission of specimen other than nasopharyngeal swab, presence of viral mutation(s) within the areas targeted by this assay, and inadequate number of viral copies(<138 copies/mL). A negative result must be combined with clinical observations, patient history, and epidemiological information. The expected result is Negative.  Fact Sheet for Patients:  EntrepreneurPulse.com.au  Fact Sheet for Healthcare Providers:  IncredibleEmployment.be  This test is no t yet approved or cleared by the [place] FDA and  has been authorized for detection and/or diagnosis of SARS-CoV-2 by FDA under an Emergency Use Authorization (EUA). This EUA will remain  in effect (meaning this test can be used) for the duration of the COVID-19 declaration under Section 564(b)(1) of the Act, 21 U.S.C.section 360bbb-3(b)(1), unless the authorization is terminated  or revoked sooner.       Influenza A by PCR NEGATIVE NEGATIVE Final   Influenza B by PCR NEGATIVE NEGATIVE Final    Comment: (NOTE) The Xpert Xpress SARS-CoV-2/FLU/RSV plus assay is intended as an aid in the diagnosis of influenza from Nasopharyngeal swab specimens and should not be used as a sole basis for treatment. Nasal washings and aspirates are unacceptable for Xpert Xpress SARS-CoV-2/FLU/RSV testing.  Fact Sheet for  Patients: EntrepreneurPulse.com.au  Fact Sheet for Healthcare Providers: IncredibleEmployment.be  This test is not yet approved or cleared by the [place] FDA and has been authorized for detection and/or diagnosis of SARS-CoV-2 by  FDA under an Emergency Use Authorization (EUA). This EUA will remain in effect (meaning this test can be used) for the duration of the COVID-19 declaration under Section 564(b)(1) of the Act, 21 U.S.C. section 360bbb-3(b)(1), unless the authorization is terminated or revoked.  Performed at [facility], [address], [place], [place] [address]   Urine Culture     Status: Abnormal   Collection Time: 06/18/21  2:07 PM   Specimen: Urine, Clean Catch  Result Value Ref Range Status   Specimen Description   Final    URINE, CLEAN CATCH Performed at [facility], [address] [place], [place] [address]    Special Requests   Final    NONE Performed at [facility], [address] [place], [place] [address]    Culture >=100,000 COLONIES/mL ESCHERICHIA COLI (A)  Final   Report Status 06/21/2021 FINAL  Final   Organism ID, Bacteria ESCHERICHIA COLI (A)  Final      Susceptibility   Escherichia coli - MIC*    AMPICILLIN 8 SENSITIVE Sensitive     CEFAZOLIN <=4 SENSITIVE Sensitive     CEFEPIME <=0.12 SENSITIVE Sensitive     CEFTRIAXONE <=0.25 SENSITIVE Sensitive     CIPROFLOXACIN <=0.25 SENSITIVE Sensitive     GENTAMICIN <=1 SENSITIVE Sensitive     IMIPENEM <=0.25 SENSITIVE Sensitive     NITROFURANTOIN <=16 SENSITIVE Sensitive     TRIMETH/SULFA <=20 SENSITIVE Sensitive     AMPICILLIN/SULBACTAM 4 SENSITIVE Sensitive     PIP/TAZO <=4 SENSITIVE Sensitive     * >=100,000 COLONIES/mL ESCHERICHIA COLI    Labs: CBC: Recent Labs  Lab 06/19/21 0419 06/20/21 0436  WBC 10.3 9.7  HGB 8.3* 8.4*  HCT 25.8* 26.5*  MCV 88.4 89.5  PLT 166 142   Basic Metabolic Panel: Recent Labs  Lab 06/19/21 0419 06/20/21 0436  06/21/21 0556 06/22/21 0456 06/23/21 0546  NA 136 138 139 139 140  K 3.4* 3.2* 3.0* 3.5 3.3*  CL 107 107 106 105 108  CO2 21* 21* 21* 23 20*  GLUCOSE 90 182* 159* 97 72  BUN 51* 52* 51* 51* 53*  CREATININE 3.02* 2.98* 2.98* 2.90* 2.99*  CALCIUM 8.4* 8.5* 8.6* 8.6* 8.6*  PHOS 5.2* 4.9* 4.6 5.0* 5.5*   Liver Function Tests: Recent Labs  Lab 06/19/21 0419 06/20/21 0436 06/21/21 0556 06/22/21 0456 06/23/21 0546  ALBUMIN 2.3* 2.7* 2.9* 3.1* 2.8*   CBG: Recent Labs  Lab 06/22/21 1110 06/22/21 1632 06/22/21 2037 06/23/21 0724 06/23/21 0756  GLUCAP 134* 205* 238* 53* 51*    Discharge time spent: greater than 30 minutes.  Signed: Orson Eva, MD [facility] 06/23/2021

## 2021-06-24 ENCOUNTER — Telehealth: Payer: Self-pay

## 2021-06-24 NOTE — Telephone Encounter (Addendum)
Transition Care Management Unsuccessful Follow-up Telephone Call  Date of discharge and from where:  [facility] 06-23-21 Dx: Acute renal failure  Attempts:  1st Attempt  Reason for unsuccessful TCM follow-up call:  Left voice message  Transition Care Management Unsuccessful Follow-up Telephone Call  Date of discharge and from where:  [facility] 06-23-21 Dx: Acute renal failure  Attempts:  2nd Attempt  Reason for unsuccessful TCM follow-up call:  Left voice message

## 2021-06-26 ENCOUNTER — Encounter: Payer: Self-pay | Admitting: Family Medicine

## 2021-06-26 ENCOUNTER — Other Ambulatory Visit: Payer: Self-pay

## 2021-06-26 ENCOUNTER — Ambulatory Visit (INDEPENDENT_AMBULATORY_CARE_PROVIDER_SITE_OTHER): Payer: 59 | Admitting: Family Medicine

## 2021-06-26 VITALS — BP 136/116 | HR 87 | Temp 98.3°F | Wt 134.4 lb

## 2021-06-26 DIAGNOSIS — E1065 Type 1 diabetes mellitus with hyperglycemia: Secondary | ICD-10-CM

## 2021-06-26 DIAGNOSIS — D649 Anemia, unspecified: Secondary | ICD-10-CM

## 2021-06-26 DIAGNOSIS — D638 Anemia in other chronic diseases classified elsewhere: Secondary | ICD-10-CM

## 2021-06-26 DIAGNOSIS — N179 Acute kidney failure, unspecified: Secondary | ICD-10-CM

## 2021-06-26 DIAGNOSIS — N184 Chronic kidney disease, stage 4 (severe): Secondary | ICD-10-CM

## 2021-06-26 DIAGNOSIS — R269 Unspecified abnormalities of gait and mobility: Secondary | ICD-10-CM

## 2021-06-26 NOTE — Telephone Encounter (Signed)
Transition Care Management Unsuccessful Follow-up Telephone Call  Date of discharge and from where:  06/23/21 - [facility] - acute renal failure  Attempts:  3rd Attempt  Reason for unsuccessful TCM follow-up call:  Left voice message   Unable to reach patient - had appt today - since multiple attempts were made, it is okay to charge TCM

## 2021-06-26 NOTE — Patient Instructions (Signed)
Decrease Insulin to 12 units nightly.  I am referring to PT.  Labs today.  Follow up in 3 months.

## 2021-06-27 ENCOUNTER — Encounter (HOSPITAL_COMMUNITY): Payer: Self-pay | Admitting: Hematology

## 2021-06-27 LAB — CMP14+EGFR
ALT: 38 IU/L — ABNORMAL HIGH (ref 0–32)
AST: 24 IU/L (ref 0–40)
Albumin/Globulin Ratio: 1.2 (ref 1.2–2.2)
Albumin: 3.3 g/dL — ABNORMAL LOW (ref 3.9–5.0)
Alkaline Phosphatase: 318 IU/L — ABNORMAL HIGH (ref 44–121)
BUN/Creatinine Ratio: 16 (ref 9–23)
BUN: 50 mg/dL — ABNORMAL HIGH (ref 6–20)
Bilirubin Total: <0.2 mg/dL (ref 0.0–1.2)
CO2: 20 mmol/L (ref 20–29)
Calcium: 8.9 mg/dL (ref 8.7–10.2)
Chloride: 107 mmol/L — ABNORMAL HIGH (ref 96–106)
Creatinine, Ser: 3.08 mg/dL — ABNORMAL HIGH (ref 0.57–1.00)
Globulin, Total: 2.7 g/dL (ref 1.5–4.5)
Glucose: 100 mg/dL — ABNORMAL HIGH (ref 70–99)
Potassium: 4.4 mmol/L (ref 3.5–5.2)
Sodium: 142 mmol/L (ref 134–144)
Total Protein: 6 g/dL (ref 6.0–8.5)
eGFR: 21 mL/min/{1.73_m2} — ABNORMAL LOW (ref >59)

## 2021-06-27 LAB — CBC
Hematocrit: 33.2 % — ABNORMAL LOW (ref 34.0–46.6)
Hemoglobin: 10.7 g/dL — ABNORMAL LOW (ref 11.1–15.9)
MCH: 28.6 pg (ref 26.6–33.0)
MCHC: 32.2 g/dL (ref 31.5–35.7)
MCV: 89 fL (ref 79–97)
Platelets: 265 10*3/uL (ref 150–450)
RBC: 3.74 x10E6/uL — ABNORMAL LOW (ref 3.77–5.28)
RDW: 14.5 % (ref 11.7–15.4)
WBC: 7.5 10*3/uL (ref 3.4–10.8)

## 2021-06-29 ENCOUNTER — Ambulatory Visit (INDEPENDENT_AMBULATORY_CARE_PROVIDER_SITE_OTHER): Payer: 59 | Admitting: "Endocrinology

## 2021-06-29 ENCOUNTER — Encounter: Payer: Self-pay | Admitting: "Endocrinology

## 2021-06-29 ENCOUNTER — Telehealth: Payer: Self-pay | Admitting: *Deleted

## 2021-06-29 ENCOUNTER — Encounter (HOSPITAL_COMMUNITY): Payer: Self-pay | Admitting: Hematology

## 2021-06-29 VITALS — BP 144/102 | HR 92 | Ht 67.0 in | Wt 138.2 lb

## 2021-06-29 DIAGNOSIS — Z91199 Patient's noncompliance with other medical treatment and regimen due to unspecified reason: Secondary | ICD-10-CM | POA: Diagnosis not present

## 2021-06-29 DIAGNOSIS — E1065 Type 1 diabetes mellitus with hyperglycemia: Secondary | ICD-10-CM | POA: Diagnosis not present

## 2021-06-29 NOTE — Progress Notes (Signed)
Endocrinology Consult Note       06/29/2021, 5:40 PM   Subjective:    Patient ID: Lindsey Murillo, female    DOB: Jul 18, 1995.  Lindsey Murillo is being seen in consultation for management of currently uncontrolled symptomatic diabetes requested by  Coral Spikes, DO.   Past Medical History:  Diagnosis Date   Acanthosis nigricans, acquired    Asthma    Diabetic autonomic neuropathy ([name])    Diabetic peripheral neuropathy ([name])    Environmental allergies    Goiter    Hypoglycemia associated with diabetes ([name])    Tachycardia    Thyroiditis, autoimmune    Type 1 diabetes mellitus in patient age 87-19 years with HbA1C goal below 7.5     Past Surgical History:  Procedure Laterality Date   BIOPSY  08/27/2016   Procedure: BIOPSY;  Surgeon: Danie Binder, MD;  [facility]: [facility];  Service: Endoscopy;;  duodenum; gastric   BIOPSY  06/27/2019   Procedure: BIOPSY;  Surgeon: Daneil Dolin, MD;  Location: [facility];  Service: Endoscopy;;   COLONOSCOPY     ESOPHAGOGASTRODUODENOSCOPY N/A 08/27/2016   Dr. Oneida Alar: mild gastritis. Negative celiac. No obvious source for dyspepsia/diarrhea   ESOPHAGOGASTRODUODENOSCOPY (EGD) WITH PROPOFOL N/A 06/27/2019   rourk: Focal abnormality of the gastric mucosa likely due to trauma (heaving).  Biopsy showed mild gastritis, negative for H. pylori.  Esophageal dilation for history of dysphagia but normal-appearing esophagus.    Social History   Socioeconomic History   Marital status: Single    Spouse name: Not on file   Number of children: 0   Years of education: Not on file   Highest education level: High school graduate  Occupational History   Occupation: unemployed  Tobacco Use   Smoking status: Never   Smokeless tobacco: Never  Vaping Use   Vaping Use: Never used  Substance and Sexual Activity   Alcohol use: No   Drug use: No   Sexual activity:  Never    Birth control/protection: Abstinence  Other Topics Concern   Not on file  Social History Narrative   Not on file   Social Determinants of Health   Financial Resource Strain: Not on file  Food Insecurity: Not on file  Transportation Needs: Not on file  Physical Activity: Not on file  Stress: Not on file  Social Connections: Not on file    Family History  Problem Relation Age of Onset   Diabetes Mother        Type II DM   Thyroid disease Mother    Diabetes Maternal Grandmother        Type II DM   Diabetes Cousin        Type II DM   Colon cancer Neg Hx    Colon polyps Neg Hx     Outpatient Encounter Medications as of 06/29/2021  Medication Sig   furosemide (LASIX) 40 MG tablet Take 40 mg by mouth 2 (two) times daily.   acetaminophen (TYLENOL) 325 MG tablet Take 650 mg by mouth every 6 (six) hours as needed (for pain).   blood glucose meter  kit and supplies KIT Dispense based on patient and insurance preference. Use up to four times daily as directed.   Continuous Blood Gluc Receiver (FREESTYLE LIBRE 2 READER) DEVI As directed   Continuous Blood Gluc Sensor (FREESTYLE LIBRE 2 SENSOR) MISC 1 Piece by Does not apply route every 14 (fourteen) days.   feeding supplement (ENSURE ENLIVE / ENSURE PLUS) LIQD Take 237 mLs by mouth 2 (two) times daily between meals.   insulin glargine-yfgn (SEMGLEE) 100 UNIT/ML injection 12 Units daily.   Insulin Pen Needle 31G X 4 MM MISC Use as directed to administer insulin.   metoprolol tartrate (LOPRESSOR) 50 MG tablet Take 50 mg by mouth 2 (two) times daily.   potassium chloride (KLOR-CON) 20 MEQ packet Take 40 mEq by mouth daily.   rosuvastatin (CRESTOR) 10 MG tablet Take 1 tablet (10 mg total) by mouth daily.   vitamin B-12 (CYANOCOBALAMIN) 1000 MCG tablet Take 1,000 mcg by mouth daily.   [DISCONTINUED] torsemide 60 MG TABS Take 60 mg by mouth 2 (two) times daily.   No facility-administered encounter medications on file as of  06/29/2021.    ALLERGIES: No Known Allergies  VACCINATION STATUS: Immunization History  Administered Date(s) Administered   Influenza,inj,Quad PF,6+ Mos 03/13/2013, 02/25/2014, 04/09/2016, 01/26/2017, 01/18/2019, 06/15/2021   Td 04/15/2017    Diabetes She has type 1 diabetes mellitus. Her disease course has been fluctuating. Associated symptoms include polydipsia and polyuria. Diabetic complications include nephropathy and peripheral neuropathy. (Non adherence to medical treatment.) Risk factors for coronary artery disease include diabetes mellitus, dyslipidemia and sedentary lifestyle. Her weight is fluctuating minimally. She is following a generally unhealthy diet. She never participates in exercise. Her home blood glucose trend is fluctuating minimally. Her breakfast blood glucose range is generally 140-180 mg/dl. Her bedtime blood glucose range is generally 180-200 mg/dl. Her overall blood glucose range is 180-200 mg/dl. (She presents with her meter showing average blood glucose of 159-195 for the last 30 days.  In the interim, she has lower her Semglee to 12 units nightly, continue to hold her prandial insulin.  ) An ACE inhibitor/angiotensin II receptor blocker is being taken.  Hyperlipidemia This is a chronic problem. Exacerbating diseases include diabetes. Current antihyperlipidemic treatment includes statins. Risk factors for coronary artery disease include dyslipidemia, diabetes mellitus, hypertension and a sedentary lifestyle.  Hypertension This is a chronic problem. Risk factors for coronary artery disease include dyslipidemia and diabetes mellitus. Past treatments include ACE inhibitors.    Review of Systems  Endocrine: Positive for polydipsia and polyuria.   Objective:    Vitals with BMI 06/29/2021 06/26/2021 06/23/2021  Height _0  - -  Weight 138 lbs 3 oz 134 lbs 6 oz 147 lbs 1 oz  BMI 21.64 56.31 49.70  Systolic 263 785 885  Diastolic 027 741 89  Pulse 92 87 -    BP  (!) 144/102    Pulse 92    Ht _1  (1.702 m)    Wt 138 lb 3.2 oz (62.7 kg)    LMP  (LMP Unknown)    BMI 21.65 kg/m   Wt Readings from Last 3 Encounters:  06/29/21 138 lb 3.2 oz (62.7 kg)  06/26/21 134 lb 6.4 oz (61 kg)  06/23/21 147 lb 0.8 oz (66.7 kg)     Physical Exam    CMP ( most recent) CMP     Component Value Date/Time   NA 142 06/26/2021 1128   K 4.4 06/26/2021 1128   CL 107 (H) 06/26/2021 1128  CO2 20 06/26/2021 1128   GLUCOSE 100 (H) 06/26/2021 1128   GLUCOSE 72 06/23/2021 0546   BUN 50 (H) 06/26/2021 1128   CREATININE 3.08 (H) 06/26/2021 1128   CREATININE 0.98 08/29/2019 1637   CALCIUM 8.9 06/26/2021 1128   PROT 6.0 06/26/2021 1128   ALBUMIN 3.3 (L) 06/26/2021 1128   AST 24 06/26/2021 1128   ALT 38 (H) 06/26/2021 1128   ALKPHOS 318 (H) 06/26/2021 1128   BILITOT <0.2 06/26/2021 1128   GFRNONAA 21 (L) 06/23/2021 0546   GFRAA >60 10/27/2019 2021     Diabetic Labs (most recent): Lab Results  Component Value Date   HGBA1C 7.5 (H) 05/26/2021   HGBA1C 9.3 (H) 05/09/2019   HGBA1C >15.5 12/13/2018     Lipid Panel ( most recent) Lipid Panel     Component Value Date/Time   CHOL 214 (H) 05/26/2021 1148   TRIG 146 05/26/2021 1148   HDL 80 05/26/2021 1148   CHOLHDL 2.7 05/26/2021 1148   CHOLHDL 2.0 11/05/2015 1252   VLDL 12 11/05/2015 1252   LDLCALC 109 (H) 05/26/2021 1148   LABVLDL 25 05/26/2021 1148      Lab Results  Component Value Date   TSH 2.520 05/26/2021   TSH 1.802 05/14/2021   TSH 0.342 (L) 05/27/2018   TSH 0.55 11/05/2015   TSH 1.175 11/28/2012   TSH 0.706 07/20/2012   TSH 1.008 08/26/2011   FREET4 1.4 11/05/2015   FREET4 1.26 11/28/2012   FREET4 1.44 07/20/2012   FREET4 1.23 08/26/2011      Assessment & Plan:   1. Uncontrolled type 1 diabetes mellitus with hyperglycemia and CKD  - Lindsey Murillo has currently uncontrolled symptomatic type (?1) DM since  26 years of age,  with most recent A1c of  7.5%.  this is a  significantly better a1c than bofore.  She presents with her meter showing average blood glucose of 159-195 for the last 30 days.  In the interim, she has lower her Semglee to 12 units nightly, continue to hold her prandial insulin.    - I had a long discussion with her about the progressive nature of diabetes and the pathology behind its complications. -her diabetes is complicated by peripheral neuropathy, CKD, peripheral ? Lymphedema she remains at a high risk for more acute and chronic complications which include CAD, CVA, CKD, retinopathy, and neuropathy. These are all discussed in detail with her.  - I discussed all available options of managing her diabetes including de-escalation of medications. I have counseled her on diet  and weight management  by adopting a Whole Food , Plant Predominant  ( WFPP) nutrition as recommended by [employer] of Lifestyle Medicine. Patient is encouraged to switch to  unprocessed or minimally processed  complex starch, adequate protein intake (mainly plant source), minimal liquid fat ( mainly vegetable oils), plenty of fruits, and vegetables. -  she is advised to stick to a routine mealtimes to eat 3 complete meals a day and snack only when necessary ( to snack only to correct hypoglycemia BG <70 day time or <100 at night).   - she acknowledges that there is a room for improvement in her food and drink choices. - Further Specific Suggestion is made for her to avoid simple carbohydrates  from her diet including Cakes, Sweet Desserts, Ice Cream, Soda (diet and regular), Sweet Tea, Candies, Chips, Cookies, Store Bought Juices, Alcohol ,  Artificial Sweeteners,  Coffee Creamer, and "Sugar-free" Products. This will help patient to have  more stable blood glucose profile and potentially avoid unintended weight gain.    - I have approached her with the following plan to manage  her diabetes and patient agrees:   Without needing prandial insulin, she is averaging  blood glucose of 159-195 for the last 14 days.  She is exquisitely sensitive to insulin.  She is advised to continue only on basal insulin for now.  She is advised to continue his Humulin 12 units nightly.  She is advised to start using her CGM, start monitoring blood glucose continuously.  She is encouraged to call clinic for blood glucose readings less than 70 or greater than 200 mg per DL x3.     - she is warned not to take insulin without proper monitoring per orders. - Adjustment parameters are given to her for hypo and hyperglycemia in writing. she is encouraged to call clinic for blood glucose levels less than 70 or above 200 mg /dl. She does not have a suitable , non insulin option to treat her diabetes.  - Specific targets for  A1c;  LDL, HDL,  and Triglycerides were discussed with the patient.  2) Blood Pressure /Hypertension:  her blood pressure is uncontrolled, slightly better.    she is advised to continue her current medications including  Lisinopril 88m p.o. daily with breakfast .  3) Lipids/Hyperlipidemia:   Review of her recent lipid panel showed uncontrolled  LDL at 109.  she  is advised to continue   Crestor 10 mg p.o. daily at bedtime.  Side effects and precautions discussed with her.  4)  Weight/Diet:  Body mass index is 21.65 kg/m.   I discussed with her the fact that loss of 5 - 10% of her  current body weight will have the most impact on her diabetes management.  The above detailed  [facility] recommendations for nutrition, exercise, sleep, social life, avoidance of risky substances, the need for restorative sleep   information will also detailed on discharge instructions.  5) Chronic Care/Health Maintenance:  -she  is on ACEI/ARB and Statin medications and  is encouraged to initiate and continue to follow up with Ophthalmology, Dentist,  Podiatrist at least yearly or according to recommendations, and advised to  stay away from smoking. I have recommended yearly flu vaccine and  pneumonia vaccine at least every 5 years; moderate intensity exercise for up to 150 minutes weekly; and  sleep for 7- 9 hours a day.  - she is  advised to maintain close follow up with CCoral Spikes DO for primary care needs, as well as her other providers for optimal and coordinated care.    I spent 31 minutes in the care of the patient today including review of labs [facility], Thyroid Function, Hematology (current and previous including abstractions from other facilities); face-to-face time discussing  her blood glucose readings/logs, discussing hypoglycemia and hyperglycemia episodes and symptoms, medications doses, her options of short and long term treatment based on the latest standards of care / guidelines;  discussion about incorporating lifestyle medicine;  and documenting the encounter.    Please refer to Patient Instructions for Blood Glucose Monitoring and Insulin/Medications Dosing Guide"  in media tab for additional information. Please  also refer to " Patient Self Inventory" in the Media  tab for reviewed elements of pertinent patient history.  J[employer] Corporationparticipated in the discussions, expressed understanding, and voiced agreement with the above plans.  All questions were answered to her satisfaction. she is encouraged to  contact clinic should she have any questions or concerns prior to her return visit.   Follow up plan: - Return in about 3 months (around 09/26/2021) for Bring Meter and Logs- A1c in Office.  Glade Lloyd, MD [facility] [address] [place], [place] [address] Phone: [phone]  Fax: [phone]    06/29/2021, 5:40 PM  This note was partially dictated with voice recognition software. Similar sounding words can be transcribed inadequately or may not  be corrected upon review.

## 2021-06-29 NOTE — Assessment & Plan Note (Signed)
Patient follows with endocrinology.  I am decreasing her Semglee to 12 units nightly given hypoglycemia.

## 2021-06-29 NOTE — Assessment & Plan Note (Signed)
Anemia stable after repeat labs.  Continue follow up with hematology/oncology.

## 2021-06-29 NOTE — Progress Notes (Signed)
Subjective:  Patient ID: Lindsey Murillo, female    DOB: August 26, 1995  Age: 26 y.o. MRN: [id number]  CC: Chief Complaint  Patient presents with   Hospitalization Follow-up    Pt was in [name] from 06/12/21-06/23/21    HPI:  26 year old female with type 1 diabetes, chronic kidney disease with nephrotic syndrome, anemia presents for hospital follow-up.  Patient was admitted from 2/3 to 2/14.  Admitted for volume overload, acute on chronic renal failure.  Patient was aggressively diuresed.  She also required blood transfusion during hospital course due to worsening anemia.  Patient has upcoming follow-up with nephrology.  Needs repeat labs today.  Patient states that she is feeling okay.  She would like referral to physical therapy to help with ambulation.  Patient states that she has been having some low blood sugars particularly in the morning.  She states that she recently had a hypoglycemic episode in the morning.  She is currently on 14 units of Semglee.  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   Vitamin D deficiency 03/20/2019   Uncontrolled type 1 diabetes mellitus with hyperglycemia ([place]) 05/12/2015   Essential hypertension, benign 11/30/2012   Diabetic peripheral neuropathy ([place])    Asthma     Social Hx   Social History   Socioeconomic History   Marital status: Single    Spouse name: Not on file   Number of children: 0   Years of education: Not on file   Highest education level: High school graduate  Occupational History    Occupation: unemployed  Tobacco Use   Smoking status: Never   Smokeless tobacco: Never  Vaping Use   Vaping Use: Never used  Substance and Sexual Activity   Alcohol use: No   Drug use: No   Sexual activity: Never    Birth control/protection: Abstinence  Other Topics Concern   Not on file  Social History Narrative   Not on file   Social Determinants of Health   Financial Resource Strain: Not on file  Food Insecurity: Not on file  Transportation Needs: Not on file  Physical Activity: Not on file  Stress: Not on file  Social Connections: Not on file    Review of Systems  Constitutional: Negative.   Respiratory: Negative.    Musculoskeletal:  Positive for gait problem.    Objective:  BP (!) 136/116    Pulse 87    Temp 98.3 F (36.8 C)    Wt 134 lb 6.4 oz (61 kg)    LMP  (LMP Unknown)    SpO2 96%    BMI 21.05 kg/m   BP/Weight 06/26/2021 06/23/2021 7/85/8850  Systolic BP 277 412 878  Diastolic BP 676 89 92  Wt. (Lbs) 134.4 147.05 168.4  BMI 21.05 23.03 26.38    Physical Exam Constitutional:      General: She is not in acute distress. HENT:     Head: Normocephalic and atraumatic.  Cardiovascular:     Rate and Rhythm: Normal rate and regular rhythm.  Pulmonary:     Effort: Pulmonary effort is normal.     Breath  sounds: Normal breath sounds. No wheezing, rhonchi or rales.  Neurological:     Mental Status: She is alert.  Psychiatric:     Comments: Flat affect.  Depressed mood.    Lab Results  Component Value Date   WBC 7.5 06/26/2021   HGB 10.7 (L) 06/26/2021   HCT 33.2 (L) 06/26/2021   PLT 265 06/26/2021   GLUCOSE 100 (H) 06/26/2021   CHOL 214 (H) 05/26/2021   TRIG 146 05/26/2021   HDL 80 05/26/2021   LDLCALC 109 (H) 05/26/2021   ALT 38 (H) 06/26/2021   AST 24 06/26/2021   NA 142 06/26/2021   K 4.4 06/26/2021   CL 107 (H) 06/26/2021   CREATININE 3.08 (H) 06/26/2021   BUN 50 (H) 06/26/2021   CO2 20 06/26/2021   TSH 2.520 05/26/2021   INR 0.8  05/20/2021   HGBA1C 7.5 (H) 05/26/2021   MICROALBUR 1,195.2 (H) 06/13/2021     Assessment & Plan:   Problem List Items Addressed This Visit       Endocrine   Uncontrolled type 1 diabetes mellitus with hyperglycemia ([place]) - Primary    Patient follows with endocrinology.  I am decreasing her Semglee to 12 units nightly given hypoglycemia.        Genitourinary   Acute renal failure superimposed on stage 4 chronic kidney disease ([place])    Repeat renal function is stable.  Needs close follow-up with nephrology.        Other   Normocytic anemia    Anemia stable after repeat labs.  Continue follow up with hematology/oncology.      Other Visit Diagnoses     Anemia of chronic disease  (Chronic)      Relevant Orders   CMP14+EGFR (Completed)   CBC (Completed)       No orders of the defined types were placed in this encounter.   Follow-up:  3 months.  [place]

## 2021-06-29 NOTE — Addendum Note (Signed)
Addended by: Coral Spikes on: 06/29/2021 11:59 AM   Modules accepted: Orders

## 2021-06-29 NOTE — Assessment & Plan Note (Signed)
Repeat renal function is stable.  Needs close follow-up with nephrology.

## 2021-06-29 NOTE — Addendum Note (Signed)
Addended by: Madelin Rear on: 06/29/2021 11:15 AM   Modules accepted: Orders

## 2021-07-06 ENCOUNTER — Other Ambulatory Visit (HOSPITAL_COMMUNITY): Payer: 59

## 2021-07-06 ENCOUNTER — Inpatient Hospital Stay (HOSPITAL_COMMUNITY): Payer: 59

## 2021-07-07 ENCOUNTER — Inpatient Hospital Stay (HOSPITAL_COMMUNITY): Payer: 59 | Attending: Hematology

## 2021-07-07 DIAGNOSIS — D649 Anemia, unspecified: Secondary | ICD-10-CM | POA: Diagnosis not present

## 2021-07-07 LAB — IRON AND TIBC
Iron: 42 ug/dL (ref 28–170)
Saturation Ratios: 18 % (ref 10.4–31.8)
TIBC: 232 ug/dL — ABNORMAL LOW (ref 250–450)
UIBC: 190 ug/dL

## 2021-07-07 LAB — CBC WITH DIFFERENTIAL/PLATELET
Abs Immature Granulocytes: 0.01 10*3/uL (ref 0.00–0.07)
Basophils Absolute: 0.1 10*3/uL (ref 0.0–0.1)
Basophils Relative: 1 %
Eosinophils Absolute: 0.4 10*3/uL (ref 0.0–0.5)
Eosinophils Relative: 4 %
HCT: 36.2 % (ref 36.0–46.0)
Hemoglobin: 11.4 g/dL — ABNORMAL LOW (ref 12.0–15.0)
Immature Granulocytes: 0 %
Lymphocytes Relative: 17 %
Lymphs Abs: 1.5 10*3/uL (ref 0.7–4.0)
MCH: 29 pg (ref 26.0–34.0)
MCHC: 31.5 g/dL (ref 30.0–36.0)
MCV: 92.1 fL (ref 80.0–100.0)
Monocytes Absolute: 0.3 10*3/uL (ref 0.1–1.0)
Monocytes Relative: 4 %
Neutro Abs: 6.6 10*3/uL (ref 1.7–7.7)
Neutrophils Relative %: 74 %
Platelets: 239 10*3/uL (ref 150–400)
RBC: 3.93 MIL/uL (ref 3.87–5.11)
RDW: 14.7 % (ref 11.5–15.5)
WBC: 8.9 10*3/uL (ref 4.0–10.5)
nRBC: 0 % (ref 0.0–0.2)

## 2021-07-07 LAB — FERRITIN: Ferritin: 324 ng/mL — ABNORMAL HIGH (ref 11–307)

## 2021-07-09 LAB — METHYLMALONIC ACID, SERUM: Methylmalonic Acid, Quantitative: 336 nmol/L (ref 0–378)

## 2021-07-13 ENCOUNTER — Inpatient Hospital Stay (HOSPITAL_COMMUNITY): Payer: 59 | Attending: Hematology | Admitting: Hematology

## 2021-07-13 ENCOUNTER — Other Ambulatory Visit: Payer: Self-pay

## 2021-07-13 DIAGNOSIS — Z794 Long term (current) use of insulin: Secondary | ICD-10-CM | POA: Insufficient documentation

## 2021-07-13 DIAGNOSIS — Z79899 Other long term (current) drug therapy: Secondary | ICD-10-CM | POA: Insufficient documentation

## 2021-07-13 DIAGNOSIS — E1022 Type 1 diabetes mellitus with diabetic chronic kidney disease: Secondary | ICD-10-CM | POA: Insufficient documentation

## 2021-07-13 DIAGNOSIS — E538 Deficiency of other specified B group vitamins: Secondary | ICD-10-CM | POA: Diagnosis not present

## 2021-07-13 DIAGNOSIS — D649 Anemia, unspecified: Secondary | ICD-10-CM | POA: Diagnosis present

## 2021-07-13 DIAGNOSIS — N1832 Chronic kidney disease, stage 3b: Secondary | ICD-10-CM | POA: Diagnosis not present

## 2021-07-13 NOTE — Progress Notes (Signed)
[place], [place] [address]   CLINIC:  Medical Oncology/Hematology  PCP:  Coral Spikes, DO [place] / [place] [address]  [phone]  REASON FOR VISIT:  Follow-up for normocytic anemia  PRIOR THERAPY: none  CURRENT THERAPY: under work-up  INTERVAL HISTORY:  Lindsey Murillo, a 26 y.o. female, returns for routine follow-up for her normocytic anemia. Fernande was last seen on 06/01/2021.  Today she reports feeling good. She denies current leg swellings. She is taking vitamin B-12 tablets. She denies current bleeding.   REVIEW OF SYSTEMS:  Review of Systems  Constitutional:  Negative for appetite change and fatigue.  HENT:   Negative for nosebleeds.   Respiratory:  Negative for hemoptysis.   Cardiovascular:  Negative for leg swelling.  Gastrointestinal:  Positive for diarrhea and nausea.  Genitourinary:  Negative for hematuria and vaginal bleeding.   Musculoskeletal:  Positive for back pain (5/10).  Hematological:  Does not bruise/bleed easily.  Psychiatric/Behavioral:  Positive for sleep disturbance.   All other systems reviewed and are negative.  PAST MEDICAL/SURGICAL HISTORY:  Past Medical History:  Diagnosis Date   Acanthosis nigricans, acquired    Asthma    Diabetic autonomic neuropathy ([name])    Diabetic peripheral neuropathy ([name])    Environmental allergies    Goiter    Hypoglycemia associated with diabetes ([name])    Tachycardia    Thyroiditis, autoimmune    Type 1 diabetes mellitus in patient age 81-19 years with HbA1C goal below 7.5    Past Surgical History:  Procedure Laterality Date   BIOPSY  08/27/2016   Procedure: BIOPSY;  Surgeon: Danie Binder, MD;  [facility];  Service: Endoscopy;;  duodenum; gastric   BIOPSY  06/27/2019   Procedure: BIOPSY;  Surgeon: Daneil Dolin, MD;  [facility];  Service: Endoscopy;;   COLONOSCOPY     ESOPHAGOGASTRODUODENOSCOPY N/A 08/27/2016    Dr. Oneida Alar: mild gastritis. Negative celiac. No obvious source for dyspepsia/diarrhea   ESOPHAGOGASTRODUODENOSCOPY (EGD) WITH PROPOFOL N/A 06/27/2019   rourk: Focal abnormality of the gastric mucosa likely due to trauma (heaving).  Biopsy showed mild gastritis, negative for H. pylori.  Esophageal dilation for history of dysphagia but normal-appearing esophagus.    SOCIAL HISTORY:  Social History   Socioeconomic History   Marital status: Single    Spouse name: Not on file   Number of children: 0   Years of education: Not on file   Highest education level: High school graduate  Occupational History   Occupation: unemployed  Tobacco Use   Smoking status: Never   Smokeless tobacco: Never  Vaping Use   Vaping Use: Never used  Substance and Sexual Activity   Alcohol use: No   Drug use: No   Sexual activity: Never    Birth control/protection: Abstinence  Other Topics Concern   Not on file  Social History Narrative   Not on file   Social Determinants of Health   Financial Resource Strain: Not on file  Food Insecurity: Not on file  Transportation Needs: Not on file  Physical Activity: Not on file  Stress: Not on file  Social Connections: Not on file  Intimate Partner Violence: Not on file    FAMILY HISTORY:  Family History  Problem Relation Age of Onset   Diabetes Mother        Type II DM   Thyroid disease Mother    Diabetes Maternal Grandmother  Type II DM   Diabetes Cousin        Type II DM   Colon cancer Neg Hx    Colon polyps Neg Hx     CURRENT MEDICATIONS:  Current Outpatient Medications  Medication Sig Dispense Refill   acetaminophen (TYLENOL) 325 MG tablet Take 650 mg by mouth every 6 (six) hours as needed (for pain).     blood glucose meter kit and supplies KIT Dispense based on patient and insurance preference. Use up to four times daily as directed. 1 each 0   Continuous Blood Gluc Receiver (FREESTYLE LIBRE 2 READER) DEVI As directed 1 each 0    Continuous Blood Gluc Sensor (FREESTYLE LIBRE 2 SENSOR) MISC 1 Piece by Does not apply route every 14 (fourteen) days. 2 each 3   feeding supplement (ENSURE ENLIVE / ENSURE PLUS) LIQD Take 237 mLs by mouth 2 (two) times daily between meals. 237 mL 12   furosemide (LASIX) 40 MG tablet Take 40 mg by mouth 2 (two) times daily.     insulin glargine-yfgn (SEMGLEE) 100 UNIT/ML injection 12 Units daily.     Insulin Pen Needle 31G X 4 MM MISC Use as directed to administer insulin. 100 each 1   metoprolol tartrate (LOPRESSOR) 50 MG tablet Take 50 mg by mouth 2 (two) times daily.     potassium chloride (KLOR-CON) 20 MEQ packet Take 40 mEq by mouth daily. 30 each 1   rosuvastatin (CRESTOR) 10 MG tablet Take 1 tablet (10 mg total) by mouth daily. 90 tablet 3   vitamin B-12 (CYANOCOBALAMIN) 1000 MCG tablet Take 1,000 mcg by mouth daily.     No current facility-administered medications for this visit.    ALLERGIES:  No Known Allergies  PHYSICAL EXAM:  Performance status (ECOG): 1 - Symptomatic but completely ambulatory  There were no vitals filed for this visit. Wt Readings from Last 3 Encounters:  06/29/21 138 lb 3.2 oz (62.7 kg)  06/26/21 134 lb 6.4 oz (61 kg)  06/23/21 147 lb 0.8 oz (66.7 kg)   Physical Exam Vitals reviewed.  Constitutional:      Appearance: Normal appearance.  Cardiovascular:     Rate and Rhythm: Normal rate and regular rhythm.     Pulses: Normal pulses.     Heart sounds: Normal heart sounds.  Pulmonary:     Effort: Pulmonary effort is normal.     Breath sounds: Normal breath sounds.  Neurological:     General: No focal deficit present.     Mental Status: She is alert and oriented to person, place, and time.  Psychiatric:        Mood and Affect: Mood normal.        Behavior: Behavior normal.    LABORATORY DATA:  I have reviewed the labs as listed.  CBC Latest Ref Rng & Units 07/07/2021 06/26/2021 06/20/2021  WBC 4.0 - 10.5 K/uL 8.9 7.5 9.7  Hemoglobin 12.0 - 15.0  g/dL 11.4(L) 10.7(L) 8.4(L)  Hematocrit 36.0 - 46.0 % 36.2 33.2(L) 26.5(L)  Platelets 150 - 400 K/uL 239 265 174   CMP Latest Ref Rng & Units 06/26/2021 06/23/2021 06/22/2021  Glucose 70 - 99 mg/dL 100(H) 72 97  BUN 6 - 20 mg/dL 50(H) 53(H) 51(H)  Creatinine 0.57 - 1.00 mg/dL 3.08(H) 2.99(H) 2.90(H)  Sodium 134 - 144 mmol/L 142 140 139  Potassium 3.5 - 5.2 mmol/L 4.4 3.3(L) 3.5  Chloride 96 - 106 mmol/L 107(H) 108 105  CO2 20 - 29 mmol/L 20 20(L)  23  Calcium 8.7 - 10.2 mg/dL 8.9 8.6(L) 8.6(L)  Total Protein 6.0 - 8.5 g/dL 6.0 - -  Total Bilirubin 0.0 - 1.2 mg/dL <0.2 - -  Alkaline Phos 44 - 121 IU/L 318(H) - -  AST 0 - 40 IU/L 24 - -  ALT 0 - 32 IU/L 38(H) - -      Component Value Date/Time   RBC 3.93 07/07/2021 1540   MCV 92.1 07/07/2021 1540   MCV 89 06/26/2021 1128   MCH 29.0 07/07/2021 1540   MCHC 31.5 07/07/2021 1540   RDW 14.7 07/07/2021 1540   RDW 14.5 06/26/2021 1128   LYMPHSABS 1.5 07/07/2021 1540   MONOABS 0.3 07/07/2021 1540   EOSABS 0.4 07/07/2021 1540   BASOSABS 0.1 07/07/2021 1540    DIAGNOSTIC IMAGING:  I have independently reviewed the scans and discussed with the patient. No results found.   ASSESSMENT:  Normocytic anemia: - Patient evaluated at the request of Dr. Marval Regal for work-up of normocytic anemia. - Labs from 01/08/2021 showed hemoglobin 7.9 with MCV 85.  White count and platelet count was normal.  Normal differential.  Ferritin was 155 and percent saturation was 25. - She has chronic kidney disease with creatinine around 1.5. - She denies any bleeding per rectum or melena.  She had a history of blood transfusion in 2021.  Denies ice pica.  No B symptoms. - EGD on 06/27/2019 with normal esophagus, status post Maloney dilation, focal abnormality in the gastric mucosa likely result of trauma (heaving).  Normal duodenal bulb and second part of duodenum.  2.  Social/family history: - She lives at home with her mother.  She used to work at [employer]  but is on disability at this time.  She has no exposure to chemicals.  She is non-smoker. - She uses a cane to ambulate since April last year. - No family history of sickle cell or thalassemia.  Father had pancreatic cancer.   PLAN:  Normocytic anemia: - She was recently hospitalized with leg swelling.  I have reviewed hospital records. - Received Ferrlecit to 50 mg on 06/14/2021 and 06/15/2021. - Denies any bleeding per rectum or melena. - Reviewed labs from 06/29/2021.  Ferritin is 324 and percent saturation 18.  Hemoglobin is 11.4. - No indication for parenteral iron therapy at this time.  RTC 2 months with repeat labs.  2.  CKD stage IIIb: - Thought to be secondary to poorly controlled diabetes and acute kidney injury due to vancomycin laxity. 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 - Continue follow-up with Dr. Marval Regal.  3.  B12 deficiency: - MMA today is 336.  Continue B12 1 mg tablet daily.  Orders placed this encounter:  No orders of the defined types were placed in this encounter.    Derek Jack, MD [place] [phone]   I, Thana Ates, am acting as a scribe for Dr. Derek Jack.  I, Derek Jack MD, have reviewed the above documentation for accuracy and completeness, and I agree with the above.

## 2021-07-13 NOTE — Patient Instructions (Signed)
[place] at [facility] ?Discharge Instructions ? ?You were seen and examined today by Dr. Delton Coombes. He reviewed your most recent labs and everything looks good. Continue taking the Vitamin B12 gummies. Please keep follow up appointments as scheduled in 2 months. ? ? ?Thank you for choosing [place] at [facility] to provide your oncology and hematology care.  To afford each patient quality time with our provider, please arrive at least 15 minutes before your scheduled appointment time.  ? ?If you have a lab appointment with the [place] please come in thru the Main Entrance and check in at the main information desk. ? ?You need to re-schedule your appointment should you arrive 10 or more minutes late.  We strive to give you quality time with our providers, and arriving late affects you and other patients whose appointments are after yours.  Also, if you no show three or more times for appointments you may be dismissed from the clinic at the providers discretion.     ?Again, thank you for choosing [facility].  Our hope is that these requests will decrease the amount of time that you wait before being seen by our physicians.       ?_____________________________________________________________ ? ?Should you have questions after your visit to [facility], please contact our office at [phone] and follow the prompts.  Our office hours are 8:00 a.m. and 4:30 p.m. Monday - Friday.  Please note that voicemails left after 4:00 p.m. may not be returned until the following business day.  We are closed weekends and major holidays.  You do have access to a nurse 24-7, just call the main number to the clinic [phone] and do not press any options, hold on the line and a nurse will answer the phone.   ? ?For prescription refill requests, have your pharmacy contact our office and allow 72 hours.   ? ?Due to Covid, you will need to wear a  mask upon entering the hospital. If you do not have a mask, a mask will be given to you at the Main Entrance upon arrival. For doctor visits, patients may have 1 support person age 22 or older with them. For treatment visits, patients can not have anyone with them due to social distancing guidelines and our immunocompromised population.  ? ?  ?

## 2021-07-14 ENCOUNTER — Ambulatory Visit (HOSPITAL_COMMUNITY): Payer: 59 | Attending: Family Medicine | Admitting: Physical Therapy

## 2021-07-14 DIAGNOSIS — M6281 Muscle weakness (generalized): Secondary | ICD-10-CM | POA: Insufficient documentation

## 2021-07-14 DIAGNOSIS — R2689 Other abnormalities of gait and mobility: Secondary | ICD-10-CM | POA: Diagnosis present

## 2021-07-14 DIAGNOSIS — R269 Unspecified abnormalities of gait and mobility: Secondary | ICD-10-CM | POA: Insufficient documentation

## 2021-07-14 NOTE — Therapy (Signed)
[place], [place], [address] Phone: [phone]   Fax:  [phone]  Physical Therapy Evaluation  Patient Details  Name: Lindsey Murillo MRN: [id number] Date of Birth: 1996-04-24 Referring Provider (PT): Amado Nash   Encounter Date: 07/14/2021   PT End of Session - 07/14/21 1310     Visit Number 1    Number of Visits 18    Date for PT Re-Evaluation 08/25/21    Authorization Type Friday [employer] needs auth after 20 visists    Authorization - Visit Number 1    Authorization - Number of Visits 30    PT Start Time 6962    PT Stop Time 1400    PT Time Calculation (min) 45 min    Activity Tolerance Patient tolerated treatment well    Behavior During Therapy WFL for tasks assessed/performed             Past Medical History:  Diagnosis Date   Acanthosis nigricans, acquired    Asthma    Diabetic autonomic neuropathy ([place])    Diabetic peripheral neuropathy ([place])    Environmental allergies    Goiter    Hypoglycemia associated with diabetes ([place])    Tachycardia    Thyroiditis, autoimmune    Type 1 diabetes mellitus in patient age 79-19 years with HbA1C goal below 7.5     Past Surgical History:  Procedure Laterality Date   BIOPSY  08/27/2016   Procedure: BIOPSY;  Surgeon: Danie Binder, MD;  [facility];  Service: Endoscopy;;  duodenum; gastric   BIOPSY  06/27/2019   Procedure: BIOPSY;  Surgeon: Daneil Dolin, MD;  [facility];  Service: Endoscopy;;   COLONOSCOPY     ESOPHAGOGASTRODUODENOSCOPY N/A 08/27/2016   Dr. Oneida Alar: mild gastritis. Negative celiac. No obvious source for dyspepsia/diarrhea   ESOPHAGOGASTRODUODENOSCOPY (EGD) WITH PROPOFOL N/A 06/27/2019   rourk: Focal abnormality of the gastric mucosa likely due to trauma (heaving).  Biopsy showed mild gastritis, negative for H. pylori.  Esophageal dilation for history of dysphagia but normal-appearing esophagus.    There  were no vitals filed for this visit.    Subjective Assessment - 07/14/21 1403     Subjective Ms. Kimmel states that she had a lot of fluid in her legs due to her renal failure she went into the hospital and once the fluid was removed her legs felt weaker than normal.  She is having difficulty walking for prolong periods of time.    Pertinent History 1 diabetes, autoimmune thyroiditis, diabetic neuropathy,  nephrotic syndrome, hypertension, asthma, and more    How long can you sit comfortably? no problem    How long can you stand comfortably? pt can can stand as long as she wants    How long can you walk comfortably? 5-10 minutes    Currently in Pain? No/denies                [facility] PT Assessment - 07/14/21 0001       Assessment   Medical Diagnosis gait difficulty    Referring Provider (PT) Amado Nash    Prior Therapy none      Precautions   Precautions None      Restrictions   Weight Bearing Restrictions No      Balance Screen   Has the patient fallen in the past 6 months No    Has the patient had a decrease in activity level because of a fear  of falling?  Yes      [place] residence    Home Access Stairs to enter    Entrance Stairs-Number of Steps 5    Entrance Stairs-Rails Right    Additional Comments does not complete reciprocally      Prior Function   Level of Independence Independent    Vocation Unemployed    Leisure pick up basketball games      Cognition   Overall Cognitive Status Within Functional Limits for tasks assessed      Functional Tests   Functional tests Single leg stance;Sit to Stand      Single Leg Stance   Comments RT 1" Lt 3"      Sit to Stand   Comments unable to come sit to stand without UE assist      ROM / Strength   AROM / PROM / Strength Strength      Strength   Strength Assessment Site Knee;Hip;Ankle    Right/Left Hip Right;Left    Right Hip Flexion 2-/5    Right Hip Extension 3/5     Right Hip ABduction 2-/5    Left Hip Flexion 2-/5    Left Hip Extension 3/5    Left Hip ABduction 2/5    Right/Left Knee Right;Left    Right Knee Flexion 4/5    Right Knee Extension 5/5    Left Knee Flexion 4/5    Left Knee Extension 5/5    Right/Left Ankle Right;Left    Right Ankle Dorsiflexion 3-/5    Right Ankle Plantar Flexion 3-/5    Left Ankle Dorsiflexion 3-/5    Left Ankle Plantar Flexion 3-/5      Ambulation/Gait   Ambulation Distance (Feet) 230 Feet    Assistive device None    Gait Pattern Decreased dorsiflexion - right;Decreased dorsiflexion - left;Right circumduction;Left circumduction;Trendelenburg                        Objective measurements completed on examination: See above findings.       [facility] PT Treatment/Exercise - 07/14/21 0001       Exercises   Exercises Knee/Hip      Knee/Hip Exercises: Seated   Other Seated Knee/Hip Exercises ankle dorsi/plantarflexion x10      Knee/Hip Exercises: Supine   Bridges 10 reps    Straight Leg Raises Limitations with knee flexed at 45 degrees as pt is not able to lift with leg straight      Knee/Hip Exercises: Sidelying   Hip ABduction Left;Right;10 reps    Hip ABduction Limitations knees bent to 45 degrees                       PT Short Term Goals - 07/14/21 1439       PT SHORT TERM GOAL #1   Title Pt will be I in HEP to improve activity tolerance.    Time 3    Period Weeks    Status New    Target Date 08/07/21   Pt will not start until next week     PT SHORT TERM GOAL #2   Title PT balance to improve so that pt is able to balance for 8 seconds on both LE to reduce risk of falling    Time 3    Period Weeks    Status New      PT SHORT TERM GOAL #3   Title  Pt B LE and core strength to be increased one grade so that pt is able to come sit to stand without the use of her UE to allow ease of getting out of chairs.    Time 3    Period Weeks    Status New                PT Long Term Goals - 07/14/21 1443       PT LONG TERM GOAL #1   Title PT to be I in advanced HEP to improve gait    Time 6    Period Weeks    Status New    Target Date 08/28/21      PT LONG TERM GOAL #2   Title Pt core and LE mm strength to be increased by 11/2 grade to allow pt to go up and down 6 steps in a reciprocal manner without having to hold onto the handrail.    Time 6    Period Weeks    Status New      PT LONG TERM GOAL #3   Title Pt to be able to single leg stance for at least 12 seconds bilaterally to allow pt to feel more confident walking on uneven ground    Time 6    Period Weeks    Status New      PT LONG TERM GOAL #4   Title pt to be able to complete 10 sit to stands in 30 seconds to demonstrate improved power to shoot basketballs.    Time 6    Period Weeks    Status New                    Plan - 07/14/21 1311     Clinical Impression Statement Ms. Melott is a 26 yo female who has been having difficulty with walking after being admitted into the hospital from 06/12/2021 thru 07/03/2021 for acute renal insufficiency.  Evaluation demonstrates decreased strength of B LE especially of the hips, decreased balance, and decreased activity tolerance which is making her have difficulty walking.  Ms. Boschert will benefit from skilled PT to address these issues and maximize her functional ability.    Personal Factors and Comorbidities Age;Comorbidity 3+;Fitness;Time since onset of injury/illness/exacerbation    Comorbidities type I DM, renal failure, autoimmune thyroiditis    Examination-Activity Limitations Bathing;Bend;Carry;Lift;Locomotion Level;Stairs;Squat    Examination-Participation Restrictions Cleaning;Community Activity;Laundry;Meal Prep;Shop    Stability/Clinical Decision Making Stable/Uncomplicated    Clinical Decision Making Moderate    Rehab Potential Good    PT Frequency 3x / week    PT Duration 6 weeks    PT Treatment/Interventions  Therapeutic exercise;Balance training;Therapeutic activities;Gait training;Manual techniques;Patient/family education    PT Next Visit Plan begin standing heelraise with UE assist, sit to stand, standing hip abduction, extension B, tandem stance and marching  for balance progress as able    PT Home Exercise Plan sitting ankle dorsi/plantarflexion, bridge, bent hip flexion/abduction B             Patient will benefit from skilled therapeutic intervention in order to improve the following deficits and impairments:  Abnormal gait, Decreased activity tolerance, Decreased balance, Decreased strength, Increased edema  Visit Diagnosis: Other abnormalities of gait and mobility - Plan: PT plan of care cert/re-cert  Muscle weakness (generalized) - Plan: PT plan of care cert/re-cert     Problem List Patient Active Problem List   Diagnosis Date Noted   Non-adherence to medical treatment  06/29/2021   Normocytic anemia 06/26/2021   Venous stasis dermatitis 06/17/2021   Peripheral edema 06/13/2021   Acute renal failure superimposed on stage 4 chronic kidney disease ([place]) 06/12/2021   Iron deficiency anemia 06/02/2021   Nephrotic syndrome 05/27/2021   Chronic pancreatitis ([place]) 08/22/2019   Pancreatic pseudocyst/cyst 08/22/2019   GERD (gastroesophageal reflux disease) 08/22/2019   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

## 2021-07-15 ENCOUNTER — Encounter (HOSPITAL_COMMUNITY): Payer: Self-pay | Admitting: Hematology

## 2021-07-16 NOTE — Telephone Encounter (Signed)
Noted  

## 2021-07-16 NOTE — Telephone Encounter (Signed)
Recommend re-evaluation in the office prior to considering colonoscopy +/-EGD given prolonged hospitalization. Please schedule in person visit with Dr. Abbey Chatters or any APP. ?

## 2021-07-21 ENCOUNTER — Encounter (HOSPITAL_COMMUNITY): Payer: Self-pay | Admitting: Physical Therapy

## 2021-07-21 ENCOUNTER — Other Ambulatory Visit: Payer: Self-pay

## 2021-07-21 ENCOUNTER — Ambulatory Visit (HOSPITAL_COMMUNITY): Payer: 59 | Admitting: Physical Therapy

## 2021-07-21 DIAGNOSIS — R2689 Other abnormalities of gait and mobility: Secondary | ICD-10-CM | POA: Diagnosis not present

## 2021-07-21 DIAGNOSIS — M6281 Muscle weakness (generalized): Secondary | ICD-10-CM

## 2021-07-21 NOTE — Patient Instructions (Signed)
Access Code: [email or web address] ?URL: [email or web address] ?Date: 07/21/2021 ?Prepared by: Mitzi Hansen Kashmir Lysaght ? ?Exercises ?Standing March with Counter Support - 2 x daily - 7 x weekly - 2 sets - 10 reps ?Standing Hip Abduction with Counter Support - 2 x daily - 7 x weekly - 2 sets - 10 reps ?Standing Hip Extension with Counter Support - 2 x daily - 7 x weekly - 2 sets - 10 reps ?Sit to Stand with Armchair - 2 x daily - 7 x weekly - 2 sets - 5 reps ? ?

## 2021-07-21 NOTE — Therapy (Signed)
?OUTPATIENT PHYSICAL THERAPY TREATMENT NOTE ? ? ?Patient Name: Lindsey Murillo ?MRN: [id number] ?DOB:04-Sep-1995, 26 y.o., female ?Today's Date: 07/21/2021 ? ?PCP: Coral Spikes, DO ?REFERRING PROVIDER: Coral Spikes, DO ? ? PT End of Session - 07/21/21 1320   ? ? Visit Number 2   ? Number of Visits 18   ? Date for PT Re-Evaluation 08/25/21   ? Authorization Type Friday [employer] needs auth after 20 visists   ? Authorization - Visit Number 2   ? Authorization - Number of Visits 30   ? PT Start Time 1320   ? PT Stop Time 1400   ? PT Time Calculation (min) 40 min   ? Activity Tolerance Patient tolerated treatment well   ? Behavior During Therapy [employer] for tasks assessed/performed   ? ?  ?  ? ?  ? ? ?Past Medical History:  ?Diagnosis Date  ? Acanthosis nigricans, acquired   ? Asthma   ? Diabetic autonomic neuropathy ([place])   ? Diabetic peripheral neuropathy ([place])   ? Environmental allergies   ? Goiter   ? Hypoglycemia associated with diabetes ([place])   ? Tachycardia   ? Thyroiditis, autoimmune   ? Type 1 diabetes mellitus in patient age 49-19 years with HbA1C goal below 7.5   ? ?Past Surgical History:  ?Procedure Laterality Date  ? BIOPSY  08/27/2016  ? Procedure: BIOPSY;  Surgeon: Danie Binder, MD;  [facility];  Service: Endoscopy;;  duodenum; gastric  ? BIOPSY  06/27/2019  ? Procedure: BIOPSY;  Surgeon: Daneil Dolin, MD;  [facility];  Service: Endoscopy;;  ? COLONOSCOPY    ? ESOPHAGOGASTRODUODENOSCOPY N/A 08/27/2016  ? Dr. Oneida Alar: mild gastritis. Negative celiac. No obvious source for dyspepsia/diarrhea  ? ESOPHAGOGASTRODUODENOSCOPY (EGD) WITH PROPOFOL N/A 06/27/2019  ? rourk: Focal abnormality of the gastric mucosa likely due to trauma (heaving).  Biopsy showed mild gastritis, negative for H. pylori.  Esophageal dilation for history of dysphagia but normal-appearing esophagus.  ? ?Patient Active Problem List  ? Diagnosis Date Noted  ? Non-adherence to medical treatment 06/29/2021  ? Normocytic  anemia 06/26/2021  ? Venous stasis dermatitis 06/17/2021  ? Peripheral edema 06/13/2021  ? Acute renal failure superimposed on stage 4 chronic kidney disease ([place]) 06/12/2021  ? Iron deficiency anemia 06/02/2021  ? Nephrotic syndrome 05/27/2021  ? Chronic pancreatitis ([place]) 08/22/2019  ? Pancreatic pseudocyst/cyst 08/22/2019  ? GERD (gastroesophageal reflux disease) 08/22/2019  ? 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 Vitamin D deficiency 03/20/2019  ? Uncontrolled type 1 diabetes mellitus with hyperglycemia ([place]) 05/12/2015  ? Essential hypertension, benign 11/30/2012  ? Diabetic peripheral neuropathy ([place])   ? Asthma   ? ? ?REFERRING DIAG: Gait Difficulty ? ?THERAPY DIAG:  ?Other abnormalities of gait and mobility ? ?Muscle weakness (generalized) ? ?PERTINENT HISTORY: 1 diabetes, autoimmune thyroiditis, diabetic neuropathy, nephrotic syndrome, hypertension, asthma, and more ? ?PRECAUTIONS: non ? ?SUBJECTIVE: Wants to get legs stronger. HEP is going well.  ? ?PAIN:  ?Are you having pain? Yes: NPRS scale: 2/10 ?Pain location: L [address] ?Pain description: sharp ?Aggravating factors: Constant ?Relieving factors: n/a ? ? ? ? ?TODAY'S TREATMENT:  ?07/21/21 ?Nustep 4 minutes, level 0, seat 6, warm up and endurance ?Heel raise 1x 10  ?Toe raise 1x 10  ?Alternating march 2x 10 bilateral  ?Hip abduction 2x 10 bilateral  ?Hip extension 2x 10 bilateral  ?STS 2 x 5 with UE support ? ? ?PATIENT EDUCATION: ?Education details: HEP ?Person educated: Patient ?Education method:  Explanation and Demonstration ?Education comprehension: verbalized understanding and returned demonstration ? ? ?HOME EXERCISE PROGRAM: ?sitting ankle dorsi/plantarflexion, bridge, bent hip flexion/abduction B 3/14 march, hip abd, hip ext ? ? PT Short Term Goals   ? ?  ? PT SHORT TERM GOAL #1  ? Title Pt will be I in HEP to improve activity tolerance.   ? Time 3   ? Period Weeks   ? Status On-going   ? Target Date 08/07/21   Pt will not start until next  week  ?  ? PT SHORT TERM GOAL #2  ? Title PT balance to improve so that pt is able to balance for 8 seconds on both LE to reduce risk of falling   ? Time 3   ? Period Weeks   ? Status On-going   ?  ? PT SHORT TERM GOAL #3  ? Title Pt B LE and core strength to be increased one grade so that pt is able to come sit to stand without the use of her UE to allow ease of getting out of chairs.   ? Time 3   ? Period Weeks   ? Status On-going   ? ?  ?  ? ?  ? ? ? PT Long Term Goals   ? ?  ? PT LONG TERM GOAL #1  ? Title PT to be I in advanced HEP to improve gait   ? Time 6   ? Period Weeks   ? Status On-going   ? Target Date 08/28/21   ?  ? PT LONG TERM GOAL #2  ? Title Pt core and LE mm strength to be increased by 11/2 grade to allow pt to go up and down 6 steps in a reciprocal manner without having to hold onto the handrail.   ? Time 6   ? Period Weeks   ? Status On-going   ?  ? PT LONG TERM GOAL #3  ? Title Pt to be able to single leg stance for at least 12 seconds bilaterally to allow pt to feel more confident walking on uneven ground   ? Time 6   ? Period Weeks   ? Status On-going   ?  ? PT LONG TERM GOAL #4  ? Title pt to be able to complete 10 sit to stands in 30 seconds to demonstrate improved power to shoot basketballs.   ? Time 6   ? Period Weeks   ? Status On-going   ? ?  ?  ? ?  ? ? ? Plan   ? ? Clinical Impression Statement Began session with nu step for warm up and endurance. Patient with decreased ankle DF AROM with toe raise secondary to strength deficit. Continued with bilateral LE strengthening which is tolerated well. She requires bilateral UE support for all exercises due to strength and balance impairment with greatest support required with STS.  Intermittent cueing for mechanics and posture with good/fair carry over.  She notes moderate fatigue at end of session. Patient will continue to benefit from physical therapy to reduce impairment and improve function.   ? Personal Factors and Comorbidities  Age;Comorbidity 3+;Fitness;Time since onset of injury/illness/exacerbation   ? Comorbidities type I DM, renal failure, autoimmune thyroiditis   ? Examination-Activity Limitations Bathing;Bend;Carry;Lift;Locomotion Level;Stairs;Squat   ? Examination-Participation Restrictions Cleaning;Community Activity;Laundry;Meal Prep;Shop   ? Stability/Clinical Decision Making Stable/Uncomplicated   ? Rehab Potential Good   ? PT Frequency 3x / week   ? PT Duration 6  weeks   ? PT Treatment/Interventions Therapeutic exercise;Balance training;Therapeutic activities;Gait training;Manual techniques;Patient/family education   ? PT Next Visit Plan strength, endurance, and balance training   ? PT Home Exercise Plan sitting ankle dorsi/plantarflexion, bridge, bent hip flexion/abduction B   ? ?  ?  ? ?  ? ? ? ? ?1:52 PM, 07/21/21 ?Mearl Latin PT, DPT ?Physical Therapist at [facility] ?[facility] ? ? ?  ? ?

## 2021-07-22 ENCOUNTER — Encounter (HOSPITAL_COMMUNITY): Payer: 59 | Admitting: Physical Therapy

## 2021-07-23 ENCOUNTER — Encounter (HOSPITAL_COMMUNITY): Payer: Self-pay

## 2021-07-23 ENCOUNTER — Ambulatory Visit (HOSPITAL_COMMUNITY): Payer: 59

## 2021-07-23 ENCOUNTER — Other Ambulatory Visit: Payer: Self-pay

## 2021-07-23 DIAGNOSIS — R2689 Other abnormalities of gait and mobility: Secondary | ICD-10-CM | POA: Diagnosis not present

## 2021-07-23 NOTE — Therapy (Signed)
?OUTPATIENT PHYSICAL THERAPY TREATMENT NOTE ? ? ?Patient Name: Lindsey Murillo ?MRN: [id number] ?DOB:27-Aug-1995, 26 y.o., female ?Today's Date: 07/23/2021 ? ?PCP: Coral Spikes, DO ?REFERRING PROVIDER: Coral Spikes, DO ? ? PT End of Session - 07/23/21 1354   ? ? Visit Number 3   ? Number of Visits 18   ? Date for PT Re-Evaluation 08/25/21   ? Authorization Type Friday [employer] needs auth after 20 visists   ? Authorization - Visit Number 2   ? PT Start Time 6948   ? PT Stop Time 5462   ? PT Time Calculation (min) 40 min   ? Activity Tolerance Patient tolerated treatment well   ? Behavior During Therapy [facility] for tasks assessed/performed   ? ?  ?  ? ?  ? ? ?Past Medical History:  ?Diagnosis Date  ? Acanthosis nigricans, acquired   ? Asthma   ? Diabetic autonomic neuropathy ([place])   ? Diabetic peripheral neuropathy ([place])   ? Environmental allergies   ? Goiter   ? Hypoglycemia associated with diabetes ([place])   ? Tachycardia   ? Thyroiditis, autoimmune   ? Type 1 diabetes mellitus in patient age 5-19 years with HbA1C goal below 7.5   ? ?Past Surgical History:  ?Procedure Laterality Date  ? BIOPSY  08/27/2016  ? Procedure: BIOPSY;  Surgeon: Danie Binder, MD;  [facility];  Service: Endoscopy;;  duodenum; gastric  ? BIOPSY  06/27/2019  ? Procedure: BIOPSY;  Surgeon: Daneil Dolin, MD;  [facility];  Service: Endoscopy;;  ? COLONOSCOPY    ? ESOPHAGOGASTRODUODENOSCOPY N/A 08/27/2016  ? Dr. Oneida Alar: mild gastritis. Negative celiac. No obvious source for dyspepsia/diarrhea  ? ESOPHAGOGASTRODUODENOSCOPY (EGD) WITH PROPOFOL N/A 06/27/2019  ? rourk: Focal abnormality of the gastric mucosa likely due to trauma (heaving).  Biopsy showed mild gastritis, negative for H. pylori.  Esophageal dilation for history of dysphagia but normal-appearing esophagus.  ? ?Patient Active Problem List  ? Diagnosis Date Noted  ? Non-adherence to medical treatment 06/29/2021  ? Normocytic anemia 06/26/2021  ? Venous stasis  dermatitis 06/17/2021  ? Peripheral edema 06/13/2021  ? Acute renal failure superimposed on stage 4 chronic kidney disease ([place]) 06/12/2021  ? Iron deficiency anemia 06/02/2021  ? Nephrotic syndrome 05/27/2021  ? Chronic pancreatitis ([place]) 08/22/2019  ? Pancreatic pseudocyst/cyst 08/22/2019  ? GERD (gastroesophageal reflux disease) 08/22/2019  ? 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 Vitamin D deficiency 03/20/2019  ? Uncontrolled type 1 diabetes mellitus with hyperglycemia ([place]) 05/12/2015  ? Essential hypertension, benign 11/30/2012  ? Diabetic peripheral neuropathy ([place])   ? Asthma   ? ? ?REFERRING DIAG: Gait Difficulty ? ?THERAPY DIAG:  ?Other abnormalities of gait and mobility ? ?Muscle weakness (generalized) ? ?PERTINENT HISTORY: 1 diabetes, autoimmune thyroiditis, diabetic neuropathy, nephrotic syndrome, hypertension, asthma, and more ? ?PRECAUTIONS: non ? ?SUBJECTIVE: Doing about the same, no change after last session but felt good with activities  ? ?PAIN:  ?Are you having pain? Yes: NPRS scale: 6/10 ?Pain location: L [address]unk ?Pain description: sharp ?Aggravating factors: Constant ?Relieving factors: n/a ? ? ? ? ?TODAY'S TREATMENT:  ?07/23/21 ?Aerobic = Nustep 6 minutes, level 1, seat 8, warm up and endurance ?Therapeutic exercises =  ?Standing in parallel bars - all with cueing for core activation, tall posture with glut squeeze on WB leg ?Heel raise 1x 10    ?Toe raise 1x 10 alternating to focus on as big a motion as able ?Alternating march 2x 10 bilateral -  second round focused on stance leg hold 3 seconds ?Hip abduction 2x 10 bilateral  ?Hip extension 2x 10 bilateral  ?STS 2 x 5 with UE support on chair and black balance foam under buttock for focus on increased B LE work and eccentric control ?Seated (new) -  ?LAQ 1 x 10 each leg  ?Hip adduction with blue gym ball between knees - 2 x 10 cued for core and pelvic floor activation  ? ? ?07/21/21 ?Nustep 4 minutes, level 0, seat 6, warm up and  endurance ?Heel raise 1x 10  ?Toe raise 1x 10  ?Alternating march 2x 10 bilateral  ?Hip abduction 2x 10 bilateral  ?Hip extension 2x 10 bilateral  ?STS 2 x 5 with UE support ? ? ?PATIENT EDUCATION: ?Education details: HEP and continued movement with tall posture as able  ?Person educated: Patient ?Education method: Explanation and Demonstration ?Education comprehension: verbalized understanding and returned demonstration ? ? ?HOME EXERCISE PROGRAM: ?sitting ankle dorsi/plantarflexion, bridge, bent hip flexion/abduction B 3/14 march, hip abd, hip ext ? ? PT Short Term Goals   ? ?  ? PT SHORT TERM GOAL #1  ? Title Pt will be I in HEP to improve activity tolerance.   ? Time 3   ? Period Weeks   ? Status On-going   ? Target Date 08/07/21   Pt will not start until next week  ?  ? PT SHORT TERM GOAL #2  ? Title PT balance to improve so that pt is able to balance for 8 seconds on both LE to reduce risk of falling   ? Time 3   ? Period Weeks   ? Status On-going   ?  ? PT SHORT TERM GOAL #3  ? Title Pt B LE and core strength to be increased one grade so that pt is able to come sit to stand without the use of her UE to allow ease of getting out of chairs.   ? Time 3   ? Period Weeks   ? Status On-going   ? ?  ?  ? ?  ? ? ? PT Long Term Goals   ? ?  ? PT LONG TERM GOAL #1  ? Title PT to be I in advanced HEP to improve gait   ? Time 6   ? Period Weeks   ? Status On-going   ? Target Date 08/28/21   ?  ? PT LONG TERM GOAL #2  ? Title Pt core and LE mm strength to be increased by 11/2 grade to allow pt to go up and down 6 steps in a reciprocal manner without having to hold onto the handrail.   ? Time 6   ? Period Weeks   ? Status On-going   ?  ? PT LONG TERM GOAL #3  ? Title Pt to be able to single leg stance for at least 12 seconds bilaterally to allow pt to feel more confident walking on uneven ground   ? Time 6   ? Period Weeks   ? Status On-going   ?  ? PT LONG TERM GOAL #4  ? Title pt to be able to complete 10 sit to stands  in 30 seconds to demonstrate improved power to shoot basketballs.   ? Time 6   ? Period Weeks   ? Status On-going   ? ?  ?  ? ?  ? ? ? Plan   ? ? Clinical Impression Statement Today's session continued to focus  on activity for exercise tolerance and increasing core awareness in gross motor activities.  Began session with nustep for warm up and endurance again with increased resistance. Demonstrating difficulty with ankle DF strength and proper marching mechanics with need for anterior chain strengthening as able.  Continued with bilateral LE strengthening which is tolerated well in parallel bars for balance support however increased cueing for core activation for both spine safety and balance awareness for posture. Some increased difficulty with right leg activities.  Patient will continue to benefit from physical therapy to reduce impairment and improve function.   ? Personal Factors and Comorbidities Age;Comorbidity 3+;Fitness;Time since onset of injury/illness/exacerbation   ? Comorbidities type I DM, renal failure, autoimmune thyroiditis   ? Examination-Activity Limitations Bathing;Bend;Carry;Lift;Locomotion Level;Stairs;Squat   ? Examination-Participation Restrictions Cleaning;Community Activity;Laundry;Meal Prep;Shop   ? Stability/Clinical Decision Making Stable/Uncomplicated   ? Rehab Potential Good   ? PT Frequency 3x / week   ? PT Duration 6 weeks   ? PT Treatment/Interventions Therapeutic exercise;Balance training;Therapeutic activities;Gait training;Manual techniques;Patient/family education   ? PT Next Visit Plan strength, endurance, and balance training   ? PT Home Exercise Plan sitting ankle dorsi/plantarflexion, bridge, bent hip flexion/abduction B   ? ?  ?  ? ?  ? ? ? ? ?1:56 PM, 07/23/21 ? ? ? ?2:16 PM, 07/23/21 ? ?Margarette Asal Carlis Abbott, PT, DPT  ?Contract Physical Therapist at  ?[facility] ?[phone] ? ? ? ? ?  ? ?

## 2021-07-28 ENCOUNTER — Encounter (HOSPITAL_COMMUNITY): Payer: Self-pay | Admitting: Physical Therapy

## 2021-07-28 ENCOUNTER — Ambulatory Visit: Payer: 59

## 2021-07-28 ENCOUNTER — Other Ambulatory Visit: Payer: Self-pay

## 2021-07-28 ENCOUNTER — Ambulatory Visit (HOSPITAL_COMMUNITY): Payer: 59 | Admitting: Physical Therapy

## 2021-07-28 DIAGNOSIS — R2689 Other abnormalities of gait and mobility: Secondary | ICD-10-CM

## 2021-07-28 DIAGNOSIS — M6281 Muscle weakness (generalized): Secondary | ICD-10-CM

## 2021-07-28 NOTE — Therapy (Signed)
?OUTPATIENT PHYSICAL THERAPY TREATMENT NOTE ? ? ?Patient Name: Lindsey Murillo ?MRN: [id number] ?DOB:13-Sep-1995, 26 y.o., female ?Today's Date: 07/28/2021 ? ?PCP: Coral Spikes, DO ?REFERRING PROVIDER: Coral Spikes, DO ? ? PT End of Session - 07/28/21 0955   ? ? Visit Number 4   ? Number of Visits 18   ? Date for PT Re-Evaluation 08/25/21   ? Authorization Type Friday [employer] needs auth after 20 visists   ? Authorization - Visit Number 3   ? PT Start Time [id number]   ? PT Stop Time 1028   ? PT Time Calculation (min) 38 min   ? Activity Tolerance Patient tolerated treatment well   ? Behavior During Therapy [facility] for tasks assessed/performed   ? ?  ?  ? ?  ? ? ?Past Medical History:  ?Diagnosis Date  ? Acanthosis nigricans, acquired   ? Asthma   ? Diabetic autonomic neuropathy ([place])   ? Diabetic peripheral neuropathy ([place])   ? Environmental allergies   ? Goiter   ? Hypoglycemia associated with diabetes ([place])   ? Tachycardia   ? Thyroiditis, autoimmune   ? Type 1 diabetes mellitus in patient age 26-19 years with HbA1C goal below 7.5   ? ?Past Surgical History:  ?Procedure Laterality Date  ? BIOPSY  08/27/2016  ? Procedure: BIOPSY;  Surgeon: Danie Binder, MD;  [facility];  Service: Endoscopy;;  duodenum; gastric  ? BIOPSY  06/27/2019  ? Procedure: BIOPSY;  Surgeon: Daneil Dolin, MD;  [facility];  Service: Endoscopy;;  ? COLONOSCOPY    ? ESOPHAGOGASTRODUODENOSCOPY N/A 08/27/2016  ? Dr. Oneida Alar: mild gastritis. Negative celiac. No obvious source for dyspepsia/diarrhea  ? ESOPHAGOGASTRODUODENOSCOPY (EGD) WITH PROPOFOL N/A 06/27/2019  ? rourk: Focal abnormality of the gastric mucosa likely due to trauma (heaving).  Biopsy showed mild gastritis, negative for H. pylori.  Esophageal dilation for history of dysphagia but normal-appearing esophagus.  ? ?Patient Active Problem List  ? Diagnosis Date Noted  ? Non-adherence to medical treatment 06/29/2021  ? Normocytic anemia 06/26/2021  ? Venous stasis  dermatitis 06/17/2021  ? Peripheral edema 06/13/2021  ? Acute renal failure superimposed on stage 4 chronic kidney disease ([place]) 06/12/2021  ? Iron deficiency anemia 06/02/2021  ? Nephrotic syndrome 05/27/2021  ? Chronic pancreatitis ([place]) 08/22/2019  ? Pancreatic pseudocyst/cyst 08/22/2019  ? GERD (gastroesophageal reflux disease) 08/22/2019  ? 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 Vitamin D deficiency 03/20/2019  ? Uncontrolled type 1 diabetes mellitus with hyperglycemia ([place]) 05/12/2015  ? Essential hypertension, benign 11/30/2012  ? Diabetic peripheral neuropathy ([place])   ? Asthma   ? ? ?REFERRING DIAG: Gait Difficulty ? ?THERAPY DIAG:  ?Other abnormalities of gait and mobility ? ?Muscle weakness (generalized) ? ?PERTINENT HISTORY: 1 diabetes, autoimmune thyroiditis, diabetic neuropathy, nephrotic syndrome, hypertension, asthma, and more ? ?PRECAUTIONS: none ? ?SUBJECTIVE: No new issues. Exercises going well.  ? ?PAIN:  ?Are you having pain? Yes: NPRS scale: 4/10 ?Pain location: LT shoulder  ?Pain description: aching, sharp ?Aggravating factors: Constant ?Relieving factors: n/a ? ? ? ? ?TODAY'S TREATMENT:  ? ?07/28/21 ?Nustep seat 9 lv 2 6 min for strength and endurance ?Heel raise x20 ?Toe raise x20 ?Step taps 6 inch x20 ?Standing hip abduction 2 x 10 ?Standing hip extension 2 x10 ?Step ups 6 inch x15 ?Sit to stand (elevated surface with black foam pad) 2 x 10 (min use of [place])  ?Staggered stance 3 x 20"  ? ?07/23/21 ?Aerobic = Nustep 6  minutes, level 1, seat 8, warm up and endurance ?Therapeutic exercises =  ?Standing in parallel bars - all with cueing for core activation, tall posture with glut squeeze on WB leg ?Heel raise 1x 10    ?Toe raise 1x 10 alternating to focus on as big a motion as able ?Alternating march 2x 10 bilateral - second round focused on stance leg hold 3 seconds ?Hip abduction 2x 10 bilateral  ?Hip extension 2x 10 bilateral  ?STS 2 x 5 with UE support on chair and black balance foam  under buttock for focus on increased B LE work and eccentric control ?Seated (new) -  ?LAQ 1 x 10 each leg  ?Hip adduction with blue gym ball between knees - 2 x 10 cued for core and pelvic floor activation  ? ? ?07/21/21 ?Nustep 4 minutes, level 0, seat 6, warm up and endurance ?Heel raise 1x 10  ?Toe raise 1x 10  ?Alternating march 2x 10 bilateral  ?Hip abduction 2x 10 bilateral  ?Hip extension 2x 10 bilateral  ?STS 2 x 5 with UE support ? ? ?PATIENT EDUCATION: ?Education details: on exercise form and function ?Person educated: Patient ?Education method: Explanation and Demonstration ?Education comprehension: verbalized understanding and returned demonstration ? ? ?HOME EXERCISE PROGRAM: ?Access Code: [email or web address] ?URL: [email or web address] ?Date: 07/28/2021 ?Prepared by: Josue Hector ? ?Exercises ?Standing Tandem Balance with Counter Support - 2-3 x daily - 7 x weekly - 1 sets - 4 reps - 20-30 second hold ?Sit to Stand with Counter Support - 2-3 x daily - 7 x weekly - 2 sets - 10 reps ?sitting ankle dorsi/plantarflexion, bridge, bent hip flexion/abduction B 3/14 march, hip abd, hip ext ? ? PT Short Term Goals   ? ?  ? PT SHORT TERM GOAL #1  ? Title Pt will be I in HEP to improve activity tolerance.   ? Time 3   ? Period Weeks   ? Status On-going   ? Target Date 08/07/21   Pt will not start until next week  ?  ? PT SHORT TERM GOAL #2  ? Title PT balance to improve so that pt is able to balance for 8 seconds on both LE to reduce risk of falling   ? Time 3   ? Period Weeks   ? Status On-going   ?  ? PT SHORT TERM GOAL #3  ? Title Pt B LE and core strength to be increased one grade so that pt is able to come sit to stand without the use of her UE to allow ease of getting out of chairs.   ? Time 3   ? Period Weeks   ? Status On-going   ? ?  ?  ? ?  ? ? ? PT Long Term Goals   ? ?  ? PT LONG TERM GOAL #1  ? Title PT to be I in advanced HEP to improve gait   ? Time 6   ? Period Weeks   ? Status On-going   ?  Target Date 08/28/21   ?  ? PT LONG TERM GOAL #2  ? Title Pt core and LE mm strength to be increased by 11/2 grade to allow pt to go up and down 6 steps in a reciprocal manner without having to hold onto the handrail.   ? Time 6   ? Period Weeks   ? Status On-going   ?  ? PT LONG TERM GOAL #3  ? Title Pt to be  able to single leg stance for at least 12 seconds bilaterally to allow pt to feel more confident walking on uneven ground   ? Time 6   ? Period Weeks   ? Status On-going   ?  ? PT LONG TERM GOAL #4  ? Title pt to be able to complete 10 sit to stands in 30 seconds to demonstrate improved power to shoot basketballs.   ? Time 6   ? Period Weeks   ? Status On-going   ? ?  ?  ? ?  ? ? ? Plan   ? ? Clinical Impression Statement Patient tolerated session well today. Added step taps and staggered stance for balance. Patient continues to be limited by noted ankle DF and LT quad weakness which continues to impact gait. Patient will continue to benefit from skilled therapy services to reduce remaining deficits and improve functional ability.   ?  ? Personal Factors and Comorbidities Age;Comorbidity 3+;Fitness;Time since onset of injury/illness/exacerbation   ? Comorbidities type I DM, renal failure, autoimmune thyroiditis   ? Examination-Activity Limitations Bathing;Bend;Carry;Lift;Locomotion Level;Stairs;Squat   ? Examination-Participation Restrictions Cleaning;Community Activity;Laundry;Meal Prep;Shop   ? Stability/Clinical Decision Making Stable/Uncomplicated   ? Rehab Potential Good   ? PT Frequency 3x / week   ? PT Duration 6 weeks   ? PT Treatment/Interventions Therapeutic exercise;Balance training;Therapeutic activities;Gait training;Manual techniques;Patient/family education   ? PT Next Visit Plan strength, endurance, and balance training   ? PT Home Exercise Plan sitting ankle dorsi/plantarflexion, bridge, bent hip flexion/abduction B   ? ?  ?  ? ?  ? ? ?10:27 AM, 07/28/21 ?Josue Hector PT DPT  ?Physical  Therapist with [place]  ?[facility]  ?[phone] ? ? ? ?  ? ?

## 2021-07-29 ENCOUNTER — Ambulatory Visit (HOSPITAL_COMMUNITY): Payer: 59

## 2021-07-29 DIAGNOSIS — R2689 Other abnormalities of gait and mobility: Secondary | ICD-10-CM

## 2021-07-29 DIAGNOSIS — M6281 Muscle weakness (generalized): Secondary | ICD-10-CM

## 2021-07-29 NOTE — Therapy (Signed)
OUTPATIENT PHYSICAL THERAPY TREATMENT NOTE   Patient Name: Lindsey Murillo MRN: [id number] DOB:05/09/1996, 26 y.o., female Today's Date: 07/29/2021  PCP: [name], DO REFERRING PROVIDER: [name], DO   PT End of Session - 07/29/21 1437     Visit Number 5    Number of Visits 18    Date for PT Re-Evaluation 08/25/21    Authorization Type [employer] needs auth after 20 visists    Authorization - Visit Number 3    PT Start Time 1435    PT Stop Time 1517    PT Time Calculation (min) 42 min    Activity Tolerance Patient tolerated treatment well    Behavior During Therapy WFL for tasks assessed/performed              Past Medical History:  Diagnosis Date   Acanthosis nigricans, acquired    Asthma    Diabetic autonomic neuropathy ([name])    Diabetic peripheral neuropathy ([name])    Environmental allergies    Goiter    Hypoglycemia associated with diabetes ([name])    Tachycardia    Thyroiditis, autoimmune    Type 1 diabetes mellitus in patient age 40-19 years with HbA1C goal below 7.5    Past Surgical History:  Procedure Laterality Date   BIOPSY  08/27/2016   Procedure: BIOPSY;  Surgeon: West Bali, MD;  [facility];  Service: Endoscopy;;  duodenum; gastric   BIOPSY  06/27/2019   Procedure: BIOPSY;  Surgeon: Corbin Ade, MD;  [facility]: [facility];  Service: Endoscopy;;   COLONOSCOPY     ESOPHAGOGASTRODUODENOSCOPY N/A 08/27/2016   Dr. Darrick Penna: mild gastritis. Negative celiac. No obvious source for dyspepsia/diarrhea   ESOPHAGOGASTRODUODENOSCOPY (EGD) WITH PROPOFOL N/A 06/27/2019   rourk: Focal abnormality of the gastric mucosa likely due to trauma (heaving).  Biopsy showed mild gastritis, negative for H. pylori.  Esophageal dilation for history of dysphagia but normal-appearing esophagus.   Patient Active Problem List   Diagnosis Date Noted   Non-adherence to medical treatment 06/29/2021   Normocytic anemia 06/26/2021   Venous stasis  dermatitis 06/17/2021   Peripheral edema 06/13/2021   Acute renal failure superimposed on stage 4 chronic kidney disease ([name]) 06/12/2021   Iron deficiency anemia 06/02/2021   Nephrotic syndrome 05/27/2021   Chronic pancreatitis ([name]) 08/22/2019   Pancreatic pseudocyst/cyst 08/22/2019   GERD (gastroesophageal reflux disease) 08/22/2019   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   Vitamin D deficiency 03/20/2019   Uncontrolled type 1 diabetes mellitus with hyperglycemia ([name]) 05/12/2015   Essential hypertension, benign 11/30/2012   Diabetic peripheral neuropathy ([name])    Asthma     REFERRING DIAG: Gait Difficulty  THERAPY DIAG:  Muscle weakness (generalized)  Other abnormalities of gait and mobility  PERTINENT HISTORY: 1 diabetes, autoimmune thyroiditis, diabetic neuropathy, nephrotic syndrome, hypertension, asthma, and more  PRECAUTIONS: none  SUBJECTIVE: Patient reports no pain today; she had some pain yesterday but its resolved today.   PAIN:  Are you having pain? Yes: NPRS scale: 0/10 Pain location: LT shoulder  Pain description: aching, sharp Aggravating factors: Constant Relieving factors: n/a     TODAY'S TREATMENT:   07/29/21 Nustep seat 9 lv 2 , arms 8, 6 min for strength and endurance Heel raise x20 Toe raise x20 Slant board gastroc stretch 5 x 20" Step taps 6 inch x20 Step ups 6 inch x 10 each alternating Lateral step ups 6 inch x 10 each Sit to stand  2 x 10 (min use of  Ues) needs verbal cues for technique Staggered stance 2 x 15"  Heel toe walking in parallel bars x 4 passes, cga Standing on foam, diagonals with red med ball x 10 each way    07/28/21 Nustep seat 9 lv 2 6 min for strength and endurance Heel raise x20 Toe raise x20 Step taps 6 inch x20 Standing hip abduction 2 x 10 Standing hip extension 2 x10 Step ups 6 inch x15 Sit to stand (elevated surface with black foam pad) 2 x 10 (min use of Ues)  Staggered stance 3 x 20"    07/23/21 Aerobic = Nustep 6 minutes, level 1, seat 8, warm up and endurance Therapeutic exercises =  Standing in parallel bars - all with cueing for core activation, tall posture with glut squeeze on WB leg Heel raise 1x 10    Toe raise 1x 10 alternating to focus on as big a motion as able Alternating march 2x 10 bilateral - second round focused on stance leg hold 3 seconds Hip abduction 2x 10 bilateral  Hip extension 2x 10 bilateral  STS 2 x 5 with UE support on chair and black balance foam under buttock for focus on increased B LE work and eccentric control Seated (new) -  LAQ 1 x 10 each leg  Hip adduction with blue gym ball between knees - 2 x 10 cued for core and pelvic floor activation    PATIENT EDUCATION: Education details: on exercise form and function Person educated: Patient Education method: Medical illustrator Education comprehension: verbalized understanding and returned demonstration   HOME EXERCISE PROGRAM: Access Code: [email or web address] URL: [email or web address] Date: 07/28/2021 Prepared by: Georges Destiny Trickey  Exercises Standing Tandem Balance with Counter Support - 2-3 x daily - 7 x weekly - 1 sets - 4 reps - 20-30 second hold Sit to Stand with Counter Support - 2-3 x daily - 7 x weekly - 2 sets - 10 reps sitting ankle dorsi/plantarflexion, bridge, bent hip flexion/abduction B 3/14 march, hip abd, hip ext   PT Short Term Goals       PT SHORT TERM GOAL #1   Title Pt will be I in HEP to improve activity tolerance.    Time 3    Period Weeks    Status On-going    Target Date 08/07/21   Pt will not start until next week     PT SHORT TERM GOAL #2   Title PT balance to improve so that pt is able to balance for 8 seconds on both LE to reduce risk of falling    Time 3    Period Weeks    Status On-going      PT SHORT TERM GOAL #3   Title Pt B LE and core strength to be increased one grade so that pt is able to come sit to stand without the  use of her UE to allow ease of getting out of chairs.    Time 3    Period Weeks    Status On-going              PT Long Term Goals       PT LONG TERM GOAL #1   Title PT to be I in advanced HEP to improve gait    Time 6    Period Weeks    Status On-going    Target Date 08/28/21      PT LONG TERM GOAL #2   Title Pt core and LE mm strength to be  increased by 11/2 grade to allow pt to go up and down 6 steps in a reciprocal manner without having to hold onto the handrail.    Time 6    Period Weeks    Status On-going      PT LONG TERM GOAL #3   Title Pt to be able to single leg stance for at least 12 seconds bilaterally to allow pt to feel more confident walking on uneven ground    Time 6    Period Weeks    Status On-going      PT LONG TERM GOAL #4   Title pt to be able to complete 10 sit to stands in 30 seconds to demonstrate improved power to shoot basketballs.    Time 6    Period Weeks    Status On-going              Plan     Clinical Impression Statement Session today focused on lower extremity strengthening and balance. Patient continues to be limited by noted ankle DF and LT quad weakness which continues to impact gait. Therapist added heel toe walking, lateral step ups, foam standing diagonals to challenge balance and strength. She needs verbal cues for technique with sit to stand to push her head over her toes to transfer her weight forward but still has difficulty with the smoothness of transferring sit to stand. Patient will continue to benefit from skilled therapy services to reduce remaining deficits and improve functional ability.      Personal Factors and Comorbidities Age;Comorbidity 3+;Fitness;Time since onset of injury/illness/exacerbation    Comorbidities type I DM, renal failure, autoimmune thyroiditis    Examination-Activity Limitations Bathing;Bend;Carry;Lift;Locomotion Level;Stairs;Squat    Examination-Participation Restrictions Cleaning;Community  Activity;Laundry;Meal Prep;Shop    Stability/Clinical Decision Making Stable/Uncomplicated    Rehab Potential Good    PT Frequency 3x / week    PT Duration 6 weeks    PT Treatment/Interventions Therapeutic exercise;Balance training;Therapeutic activities;Gait training;Manual techniques;Patient/family education    PT Next Visit Plan strength, endurance, and balance training    PT Home Exercise Plan sitting ankle dorsi/plantarflexion, bridge, bent hip flexion/abduction B             3:26 PM, 07/29/21 Ople Girgis Small Nevin Grizzle MPT [facility] [place] [phone] Ph:[phone]

## 2021-07-30 ENCOUNTER — Encounter (HOSPITAL_COMMUNITY): Payer: 59

## 2021-07-31 ENCOUNTER — Other Ambulatory Visit: Payer: Self-pay

## 2021-07-31 ENCOUNTER — Ambulatory Visit (HOSPITAL_COMMUNITY): Payer: 59 | Admitting: Physical Therapy

## 2021-07-31 DIAGNOSIS — R2689 Other abnormalities of gait and mobility: Secondary | ICD-10-CM | POA: Diagnosis not present

## 2021-07-31 DIAGNOSIS — M6281 Muscle weakness (generalized): Secondary | ICD-10-CM

## 2021-07-31 NOTE — Therapy (Signed)
?OUTPATIENT PHYSICAL THERAPY TREATMENT NOTE ? ? ?Patient Name: Lindsey Murillo ?MRN: [id number] ?DOB:04/13/1996, 26 y.o., female ?Today's Date: 07/31/2021 ? ?PCP: Coral Spikes, DO ?REFERRING PROVIDER: Coral Spikes, DO ? ? PT End of Session - 07/31/21 0916   ? ? Visit Number 6   ? Number of Visits 18   ? Date for PT Re-Evaluation 08/25/21   ? Authorization Type [employer] needs auth after 20 visists   ? Authorization - Visit Number 3   ? PT Start Time [phone]   ? PT Stop Time 1000   ? PT Time Calculation (min) 42 min   ? Activity Tolerance Patient tolerated treatment well   ? Behavior During Therapy [facility] for tasks assessed/performed   ? ?  ?  ? ?  ? ? ? ?Past Medical History:  ?Diagnosis Date  ? Acanthosis nigricans, acquired   ? Asthma   ? Diabetic autonomic neuropathy ([place])   ? Diabetic peripheral neuropathy ([place])   ? Environmental allergies   ? Goiter   ? Hypoglycemia associated with diabetes ([place])   ? Tachycardia   ? Thyroiditis, autoimmune   ? Type 1 diabetes mellitus in patient age 43-19 years with HbA1C goal below 7.5   ? ?Past Surgical History:  ?Procedure Laterality Date  ? BIOPSY  08/27/2016  ? Procedure: BIOPSY;  Surgeon: Danie Binder, MD;  [facility];  Service: Endoscopy;;  duodenum; gastric  ? BIOPSY  06/27/2019  ? Procedure: BIOPSY;  Surgeon: Daneil Dolin, MD;  [facility];  Service: Endoscopy;;  ? COLONOSCOPY    ? ESOPHAGOGASTRODUODENOSCOPY N/A 08/27/2016  ? Dr. Oneida Alar: mild gastritis. Negative celiac. No obvious source for dyspepsia/diarrhea  ? ESOPHAGOGASTRODUODENOSCOPY (EGD) WITH PROPOFOL N/A 06/27/2019  ? rourk: Focal abnormality of the gastric mucosa likely due to trauma (heaving).  Biopsy showed mild gastritis, negative for H. pylori.  Esophageal dilation for history of dysphagia but normal-appearing esophagus.  ? ?Patient Active Problem List  ? Diagnosis Date Noted  ? Non-adherence to medical treatment 06/29/2021  ? Normocytic anemia 06/26/2021  ? Venous stasis  dermatitis 06/17/2021  ? Peripheral edema 06/13/2021  ? Acute renal failure superimposed on stage 4 chronic kidney disease ([place]) 06/12/2021  ? Iron deficiency anemia 06/02/2021  ? Nephrotic syndrome 05/27/2021  ? Chronic pancreatitis ([place]) 08/22/2019  ? Pancreatic pseudocyst/cyst 08/22/2019  ? GERD (gastroesophageal reflux disease) 08/22/2019  ? 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 Vitamin D deficiency 03/20/2019  ? Uncontrolled type 1 diabetes mellitus with hyperglycemia ([place]) 05/12/2015  ? Essential hypertension, benign 11/30/2012  ? Diabetic peripheral neuropathy ([place])   ? Asthma   ? ? ?REFERRING DIAG: Gait Difficulty ? ?THERAPY DIAG:  ?Muscle weakness (generalized) ? ?PERTINENT HISTORY: 1 diabetes, autoimmune thyroiditis, diabetic neuropathy, nephrotic syndrome, hypertension, asthma, and more ? ?PRECAUTIONS: none ? ?SUBJECTIVE: PT states that she is doing her exercises at home.  ?PAIN:  ?Are you having pain? No more soreness  ? ? ? ?TODAY'S TREATMENT:  ?07/31/2021 ?          Standing: ?             Forearm supported at counter hip extension x 10 B ?             Hip abduction isometric against cabinet x 10 B ?             Heel raises x 15 ?             Toe raises at //  on slant x 15 ?             Squat x 15 ?             Slant board stretch 3 x 30  ?             Marching x 10   ?             Side stepping in // x 3  ?             Lunge onto 6" step x 10  ?             SLS x 3 one hand hold  ?             Step up 6" B x 10  ?         ?             sit to stand x 5 ?             ? ?07/29/21 ?Nustep seat 9 lv 2 , arms 8, 6 min for strength and endurance ?Heel raise x20 ?Toe raise x20 ?Slant board gastroc stretch 5 x 20" ?Step taps 6 inch x20 ?Step ups 6 inch x 10 each alternating ?Lateral step ups 6 inch x 10 each ?Sit to stand  2 x 10 (min use of Ues) needs verbal cues for technique ?Staggered stance 2 x 15"  ?Heel toe walking in parallel bars x 4 passes, cga ?Standing on foam, diagonals with red med ball  x 10 each way ? ? ? ?07/28/21 ?Nustep seat 9 lv 2 6 min for strength and endurance ?Heel raise x20 ?Toe raise x20 ?Step taps 6 inch x20 ?Standing hip abduction 2 x 10 ?Standing hip extension 2 x10 ?Step ups 6 inch x15 ?Sit to stand (elevated surface with black foam pad) 2 x 10 (min use of Ues)  ?Staggered stance 3 x 20"  ? ?07/23/21 ?Aerobic = Nustep 6 minutes, level 1, seat 8, warm up and endurance ?Therapeutic exercises =  ?Standing in parallel bars - all with cueing for core activation, tall posture with glut squeeze on WB leg ?Heel raise 1x 10    ?Toe raise 1x 10 alternating to focus on as big a motion as able ?Alternating march 2x 10 bilateral - second round focused on stance leg hold 3 seconds ?Hip abduction 2x 10 bilateral  ?Hip extension 2x 10 bilateral  ?STS 2 x 5 with UE support on chair and black balance foam under buttock for focus on increased B LE work and eccentric control ?Seated (new) -  ?LAQ 1 x 10 each leg  ?Hip adduction with blue gym ball between knees - 2 x 10 cued for core and pelvic floor activation  ? ? ?PATIENT EDUCATION: ?Education details: on exercise form and function ?Person educated: Patient ?Education method: Explanation and Demonstration ?Education comprehension: verbalized understanding and returned demonstration ? ? ?HOME EXERCISE PROGRAM: ?Access Code: [email or web address] ?URL: [email or web address] ?Date: 07/28/2021 ?Prepared by: Josue Hector ? ?Exercises ?Standing Tandem Balance with Counter Support - 2-3 x daily - 7 x weekly - 1 sets - 4 reps - 20-30 second hold ?Sit to Stand with Counter Support - 2-3 x daily - 7 x weekly - 2 sets - 10 reps ?sitting ankle dorsi/plantarflexion, bridge, bent hip flexion/abduction B 3/14 march, hip abd, hip ext ?  3/24:  side stepping at counter, marching  ? PT Short Term Goals   ? ?  ? PT SHORT TERM GOAL #1  ? Title Pt will be I in HEP to improve activity tolerance.   ? Time 3   ? Period Weeks   ? Status On-going   ?  Target Date 08/07/21   Pt will not start until next week  ?  ? PT SHORT TERM GOAL #2  ? Title PT balance to improve so that pt is able to balance for 8 seconds on both LE to reduce risk of falling   ? Time 3   ? Period Weeks   ? Status On-going   ?  ? PT SHORT TERM GOAL #3  ? Title Pt B LE and core strength to be increased one grade so that pt is able to come sit to stand without the use of her UE to allow ease of getting out of chairs.   ? Time 3   ? Period Weeks   ? Status On-going   ? ?  ?  ? ?  ? ? ? PT Long Term Goals   ? ?  ? PT LONG TERM GOAL #1  ? Title PT to be I in advanced HEP to improve gait   ? Time 6   ? Period Weeks   ? Status On-going   ? Target Date 08/28/21   ?  ? PT LONG TERM GOAL #2  ? Title Pt core and LE mm strength to be increased by 11/2 grade to allow pt to go up and down 6 steps in a reciprocal manner without having to hold onto the handrail.   ? Time 6   ? Period Weeks   ? Status On-going   ?  ? PT LONG TERM GOAL #3  ? Title Pt to be able to single leg stance for at least 12 seconds bilaterally to allow pt to feel more confident walking on uneven ground   ? Time 6   ? Period Weeks   ? Status On-going   ?  ? PT LONG TERM GOAL #4  ? Title pt to be able to complete 10 sit to stands in 30 seconds to demonstrate improved power to shoot basketballs.   ? Time 6   ? Period Weeks   ? Status On-going   ? ?  ?  ? ?  ? ? ? Plan   ? ? Clinical Impression Statement Treatment continues to focus on strengthening prior to balance.  Pt continues to have significant trendelenburg gt due to weak hip mm and poor anterior tib strength affecting gt.  Pt will continue to benefit from skilled PT   ? Personal Factors and Comorbidities Age;Comorbidity 3+;Fitness;Time since onset of injury/illness/exacerbation   ? Comorbidities type I DM, renal failure, autoimmune thyroiditis   ? Examination-Activity Limitations Bathing;Bend;Carry;Lift;Locomotion Level;Stairs;Squat   ? Examination-Participation Restrictions  Cleaning;Community Activity;Laundry;Meal Prep;Shop   ? Stability/Clinical Decision Making Stable/Uncomplicated   ? Rehab Potential Good   ? PT Frequency 3x / week   ? PT Duration 6 weeks   ? PT Treatment/Interventions

## 2021-08-03 ENCOUNTER — Other Ambulatory Visit: Payer: Self-pay

## 2021-08-03 ENCOUNTER — Ambulatory Visit (HOSPITAL_COMMUNITY): Payer: 59

## 2021-08-03 DIAGNOSIS — M6281 Muscle weakness (generalized): Secondary | ICD-10-CM

## 2021-08-03 DIAGNOSIS — R2689 Other abnormalities of gait and mobility: Secondary | ICD-10-CM | POA: Diagnosis not present

## 2021-08-03 NOTE — Therapy (Signed)
OUTPATIENT PHYSICAL THERAPY TREATMENT NOTE   Patient Name: Lindsey Murillo MRN: [id number] DOB:[birth year], 26 y.o., female Today's Date: 08/03/2021  PCP: [name], DO REFERRING PROVIDER: [name], DO   PT End of Session - 08/03/21 1445     Visit Number 7    Number of Visits 18    Date for PT Re-Evaluation 08/25/21    Authorization Type [employer] needs auth after 20 visists    Authorization - Visit Number 3    PT Start Time 1442    PT Stop Time 1530    PT Time Calculation (min) 48 min    Activity Tolerance Patient tolerated treatment well    Behavior During Therapy WFL for tasks assessed/performed               Past Medical History:  Diagnosis Date   Acanthosis nigricans, acquired    Asthma    Diabetic autonomic neuropathy ([name])    Diabetic peripheral neuropathy ([name])    Environmental allergies    Goiter    Hypoglycemia associated with diabetes ([name])    Tachycardia    Thyroiditis, autoimmune    Type 1 diabetes mellitus in patient age 56-19 years with HbA1C goal below 7.5    Past Surgical History:  Procedure Laterality Date   BIOPSY  08/27/2016   Procedure: BIOPSY;  Surgeon: West Bali, MD;  [facility];  Service: Endoscopy;;  duodenum; gastric   BIOPSY  06/27/2019   Procedure: BIOPSY;  Surgeon: Corbin Ade, MD;  [facility];  Service: Endoscopy;;   COLONOSCOPY     ESOPHAGOGASTRODUODENOSCOPY N/A 08/27/2016   Dr. Darrick Penna: mild gastritis. Negative celiac. No obvious source for dyspepsia/diarrhea   ESOPHAGOGASTRODUODENOSCOPY (EGD) WITH PROPOFOL N/A 06/27/2019   rourk: Focal abnormality of the gastric mucosa likely due to trauma (heaving).  Biopsy showed mild gastritis, negative for H. pylori.  Esophageal dilation for history of dysphagia but normal-appearing esophagus.   Patient Active Problem List   Diagnosis Date Noted   Non-adherence to medical treatment 06/29/2021   Normocytic anemia 06/26/2021   Venous stasis  dermatitis 06/17/2021   Peripheral edema 06/13/2021   Acute renal failure superimposed on stage 4 chronic kidney disease ([name]) 06/12/2021   Iron deficiency anemia 06/02/2021   Nephrotic syndrome 05/27/2021   Chronic pancreatitis ([name]) 08/22/2019   Pancreatic pseudocyst/cyst 08/22/2019   GERD (gastroesophageal reflux disease) 08/22/2019   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   Vitamin D deficiency 03/20/2019   Uncontrolled type 1 diabetes mellitus with hyperglycemia ([name]) 05/12/2015   Essential hypertension, benign 11/30/2012   Diabetic peripheral neuropathy ([name])    Asthma     REFERRING DIAG: Gait Difficulty  THERAPY DIAG:  Muscle weakness (generalized)  Other abnormalities of gait and mobility  PERTINENT HISTORY: 1 diabetes, autoimmune thyroiditis, diabetic neuropathy, nephrotic syndrome, hypertension, asthma, and more  PRECAUTIONS: none  SUBJECTIVE: some pain left side trunk today; "comes and goes" PAIN:  Are you having pain? 5/10 pain Left trunk    TODAY'S TREATMENT:   08/03/21 Nustep seat 9 lv 2 , arms 8, 6 min for strength and endurance Standing:              Forearm supported at counter hip extension x 10 B              Hip abduction isometric against cabinet x 10 B 3" hold              Heel raises x 20  Toe raises at // x 20              Squat x 2 x 10              Slant board stretch 5 x 20"              Marching x 10  each leg              Side stepping in // x 3 passes              Lunge onto 6" step x 10               SLS x 3 x 10" each one hand hold               Step up 6" B x 10    Lateral step ups 6 inch x 10 each              sit to stand 2  x 5 with emphasis on shifting weight forward to stand   07/31/2021           Standing:              Forearm supported at counter hip extension x 10 B              Hip abduction isometric against cabinet x 10 B              Heel raises x 15              Toe raises at // on slant x 15               Squat x 15              Slant board stretch 3 x 30               Marching x 10                Side stepping in // x 3               Lunge onto 6" step x 10               SLS x 3 one hand hold               Step up 6" B x 10                            sit to stand x 5               07/29/21 Nustep seat 9 lv 2 , arms 8, 6 min for strength and endurance Heel raise x20 Toe raise x20 Slant board gastroc stretch 5 x 20" Step taps 6 inch x20 Step ups 6 inch x 10 each alternating Lateral step ups 6 inch x 10 each Sit to stand  2 x 10 (min use of Ues) needs verbal cues for technique Staggered stance 2 x 15"  Heel toe walking in parallel bars x 4 passes, cga Standing on foam, diagonals with red med ball x 10 each way    07/28/21 Nustep seat 9 lv 2 6 min for strength and endurance Heel raise x20 Toe raise x20 Step taps 6 inch x20 Standing hip abduction 2 x 10 Standing hip extension 2 x10 Step ups 6 inch x15 Sit to stand (elevated surface with black  foam pad) 2 x 10 (min use of Ues)  Staggered stance 3 x 20"   07/23/21 Aerobic = Nustep 6 minutes, level 1, seat 8, warm up and endurance Therapeutic exercises =  Standing in parallel bars - all with cueing for core activation, tall posture with glut squeeze on WB leg Heel raise 1x 10    Toe raise 1x 10 alternating to focus on as big a motion as able Alternating march 2x 10 bilateral - second round focused on stance leg hold 3 seconds Hip abduction 2x 10 bilateral  Hip extension 2x 10 bilateral  STS 2 x 5 with UE support on chair and black balance foam under buttock for focus on increased B LE work and eccentric control Seated (new) -  LAQ 1 x 10 each leg  Hip adduction with blue gym ball between knees - 2 x 10 cued for core and pelvic floor activation    PATIENT EDUCATION: Education details: on exercise form and function Person educated: Patient Education method: Medical illustrator Education comprehension:  verbalized understanding and returned demonstration   HOME EXERCISE PROGRAM: Access Code: [email or web address] URL: [email or web address] Date: 07/28/2021 Prepared by: Georges Bowe Sidor  Exercises Standing Tandem Balance with Counter Support - 2-3 x daily - 7 x weekly - 1 sets - 4 reps - 20-30 second hold Sit to Stand with Counter Support - 2-3 x daily - 7 x weekly - 2 sets - 10 reps sitting ankle dorsi/plantarflexion, bridge, bent hip flexion/abduction B 3/14 march, hip abd, hip ext                        3/24:  side stepping at counter, marching   PT Short Term Goals       PT SHORT TERM GOAL #1   Title Pt will be I in HEP to improve activity tolerance.    Time 3    Period Weeks    Status On-going    Target Date 08/07/21   Pt will not start until next week     PT SHORT TERM GOAL #2   Title PT balance to improve so that pt is able to balance for 8 seconds on both LE to reduce risk of falling    Time 3    Period Weeks    Status On-going      PT SHORT TERM GOAL #3   Title Pt B LE and core strength to be increased one grade so that pt is able to come sit to stand without the use of her UE to allow ease of getting out of chairs.    Time 3    Period Weeks    Status On-going              PT Long Term Goals       PT LONG TERM GOAL #1   Title PT to be I in advanced HEP to improve gait    Time 6    Period Weeks    Status On-going    Target Date 08/28/21      PT LONG TERM GOAL #2   Title Pt core and LE mm strength to be increased by 11/2 grade to allow pt to go up and down 6 steps in a reciprocal manner without having to hold onto the handrail.    Time 6    Period Weeks    Status On-going      PT LONG TERM GOAL #3   Title Pt  to be able to single leg stance for at least 12 seconds bilaterally to allow pt to feel more confident walking on uneven ground    Time 6    Period Weeks    Status On-going      PT LONG TERM GOAL #4   Title pt to be able to complete 10 sit  to stands in 30 seconds to demonstrate improved power to shoot basketballs.    Time 6    Period Weeks    Status On-going              Plan     Clinical Impression Statement Treatment continues to focus on strengthening and  balance.  Pt continues to have significant trendelenburg gt due to weak hip mm and poor anterior tib strength affecting gt. Patient uses trunk lateral lean to compensate for hip weakness with standing balance activities and gait. Patient very weak at the initiation of sit to stand.  Pt will continue to benefit from skilled PT  services to reduce deficits and improve functional level.    Personal Factors and Comorbidities Age;Comorbidity 3+;Fitness;Time since onset of injury/illness/exacerbation    Comorbidities type I DM, renal failure, autoimmune thyroiditis    Examination-Activity Limitations Bathing;Bend;Carry;Lift;Locomotion Level;Stairs;Squat    Examination-Participation Restrictions Cleaning;Community Activity;Laundry;Meal Prep;Shop    Stability/Clinical Decision Making Stable/Uncomplicated    Rehab Potential Good    PT Frequency 3x / week    PT Duration 6 weeks    PT Treatment/Interventions Therapeutic exercise;Balance training;Therapeutic activities;Gait training;Manual techniques;Patient/family education    PT Next Visit Plan Continue to progress strength, endurance, and balance training    PT Home Exercise Plan sitting ankle dorsi/plantarflexion, bridge, bent hip flexion/abduction B           3:28 PM, 08/03/21 Daielle Melcher Small Malori Myers MPT [facility] [place] [phone] Ph:[phone]

## 2021-08-04 ENCOUNTER — Telehealth (HOSPITAL_COMMUNITY): Payer: Self-pay | Admitting: Physical Therapy

## 2021-08-04 ENCOUNTER — Encounter (HOSPITAL_COMMUNITY): Payer: 59 | Admitting: Physical Therapy

## 2021-08-04 NOTE — Telephone Encounter (Signed)
Pt did not show for appt. Called and left message regarding missed appt and reminder for next scheduled appt tomorrow.  ? ?Carol Theys Sula Soda, PTA/CLT, WTA ?[phone] ? ?

## 2021-08-04 NOTE — Therapy (Incomplete)
?OUTPATIENT PHYSICAL THERAPY TREATMENT NOTE ? ? ?Patient Name: Lindsey Murillo ?MRN: [id number] ?DOB:Sep 09, 1995, 26 y.o., female ?Today's Date: 08/04/2021 ? ?PCP: Coral Spikes, DO ?REFERRING PROVIDER: Coral Spikes, DO ? ? ? ? ? ? ?Past Medical History:  ?Diagnosis Date  ? Acanthosis nigricans, acquired   ? Asthma   ? Diabetic autonomic neuropathy ([place])   ? Diabetic peripheral neuropathy ([place])   ? Environmental allergies   ? Goiter   ? Hypoglycemia associated with diabetes ([place])   ? Tachycardia   ? Thyroiditis, autoimmune   ? Type 1 diabetes mellitus in patient age 26-19 years with HbA1C goal below 7.5   ? ?Past Surgical History:  ?Procedure Laterality Date  ? BIOPSY  08/27/2016  ? Procedure: BIOPSY;  Surgeon: Danie Binder, MD;  [facility];  Service: Endoscopy;;  duodenum; gastric  ? BIOPSY  06/27/2019  ? Procedure: BIOPSY;  Surgeon: Daneil Dolin, MD;  [facility];  Service: Endoscopy;;  ? COLONOSCOPY    ? ESOPHAGOGASTRODUODENOSCOPY N/A 08/27/2016  ? Dr. Oneida Alar: mild gastritis. Negative celiac. No obvious source for dyspepsia/diarrhea  ? ESOPHAGOGASTRODUODENOSCOPY (EGD) WITH PROPOFOL N/A 06/27/2019  ? rourk: Focal abnormality of the gastric mucosa likely due to trauma (heaving).  Biopsy showed mild gastritis, negative for H. pylori.  Esophageal dilation for history of dysphagia but normal-appearing esophagus.  ? ?Patient Active Problem List  ? Diagnosis Date Noted  ? Non-adherence to medical treatment 06/29/2021  ? Normocytic anemia 06/26/2021  ? Venous stasis dermatitis 06/17/2021  ? Peripheral edema 06/13/2021  ? Acute renal failure superimposed on stage 4 chronic kidney disease ([place]) 06/12/2021  ? Iron deficiency anemia 06/02/2021  ? Nephrotic syndrome 05/27/2021  ? Chronic pancreatitis ([place]) 08/22/2019  ? Pancreatic pseudocyst/cyst 08/22/2019  ? GERD (gastroesophageal reflux disease) 08/22/2019  ? 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 Vitamin D deficiency 03/20/2019  ?  Uncontrolled type 1 diabetes mellitus with hyperglycemia ([place]) 05/12/2015  ? Essential hypertension, benign 11/30/2012  ? Diabetic peripheral neuropathy ([place])   ? Asthma   ? ? ?REFERRING DIAG: Gait Difficulty ? ?THERAPY DIAG:  ?No diagnosis found. ? ?PERTINENT HISTORY: 1 diabetes, autoimmune thyroiditis, diabetic neuropathy, nephrotic syndrome, hypertension, asthma, and more ? ?PRECAUTIONS: none ? ?SUBJECTIVE: some pain left side trunk today; "comes and goes" ?PAIN:  ?Are you having pain? 5/10 pain Left trunk ? ? ? ?TODAY'S TREATMENT:  ? ?08/04/21 ?Nustep seat 9 lv 2 , arms 8, 6 min for strength and endurance ?Standing: ?             Forearm supported at counter hip extension x 10 B ?             Hip abduction isometric against cabinet x 10 B 3" hold ?             Heel raises x 20 ?             Toe raises at // x 20 ?             Squat x 2 x 10 ?             Slant board stretch 5 x 20" ?             Marching x 10  each leg ?             Side stepping in // x 3 passes ?             Lunge onto 6"  step x 10  ?             SLS x 3 x 10" each one hand hold  ?             Step up 6" B x 10  ?  Lateral step ups 6 inch x 10 each ?             sit to stand 2  x 5 with emphasis on shifting weight forward to stand ? Vectors 5X5" with 1 UE assist (new) ? Tandem stance on foam BB (new) ? [place] in tandem stance (new) ? ? ?08/03/21 ?Nustep seat 9 lv 2 , arms 8, 6 min for strength and endurance ?Standing: ?             Forearm supported at counter hip extension x 10 B ?             Hip abduction isometric against cabinet x 10 B 3" hold ?             Heel raises x 20 ?             Toe raises at // x 20 ?             Squat x 2 x 10 ?             Slant board stretch 5 x 20" ?             Marching x 10  each leg ?             Side stepping in // x 3 passes ?             Lunge onto 6" step x 10  ?             SLS x 3 x 10" each one hand hold  ?             Step up 6" B x 10  ?  Lateral step ups 6 inch x 10 each ?             sit to  stand 2  x 5 with emphasis on shifting weight forward to stand ? ? ?07/31/2021 ?          Standing: ?             Forearm supported at counter hip extension x 10 B ?             Hip abduction isometric against cabinet x 10 B ?             Heel raises x 15 ?             Toe raises at // on slant x 15 ?             Squat x 15 ?             Slant board stretch 3 x 30  ?             Marching x 10   ?             Side stepping in // x 3  ?             Lunge onto 6" step x 10  ?             SLS x 3 one hand hold  ?  Step up 6" B x 10  ?   ? ?         ?             sit to stand x 5 ?             ? ?07/29/21 ?Nustep seat 9 lv 2 , arms 8, 6 min for strength and endurance ?Heel raise x20 ?Toe raise x20 ?Slant board gastroc stretch 5 x 20" ?Step taps 6 inch x20 ?Step ups 6 inch x 10 each alternating ?Lateral step ups 6 inch x 10 each ?Sit to stand  2 x 10 (min use of Ues) needs verbal cues for technique ?Staggered stance 2 x 15"  ?Heel toe walking in parallel bars x 4 passes, cga ?Standing on foam, diagonals with red med ball x 10 each way ? ? ? ?07/28/21 ?Nustep seat 9 lv 2 6 min for strength and endurance ?Heel raise x20 ?Toe raise x20 ?Step taps 6 inch x20 ?Standing hip abduction 2 x 10 ?Standing hip extension 2 x10 ?Step ups 6 inch x15 ?Sit to stand (elevated surface with black foam pad) 2 x 10 (min use of Ues)  ?Staggered stance 3 x 20"  ? ? ?PATIENT EDUCATION: ?Education details: on exercise form and function ?Person educated: Patient ?Education method: Explanation and Demonstration ?Education comprehension: verbalized understanding and returned demonstration ? ? ?HOME EXERCISE PROGRAM: ?Access Code: [email or web address] ?URL: [email or web address] ?Date: 07/28/2021 ?Prepared by: Josue Hector ? ?Exercises ?Standing Tandem Balance with Counter Support - 2-3 x daily - 7 x weekly - 1 sets - 4 reps - 20-30 second hold ?Sit to Stand with Counter Support - 2-3 x daily - 7 x weekly - 2 sets - 10 reps ?sitting ankle  dorsi/plantarflexion, bridge, bent hip flexion/abduction B 3/14 march, hip abd, hip ext ?                       3/24:  side stepping at counter, marching  ? PT Short Term Goals   ? ?  ? PT SHORT TERM GOAL #1  ? Title Pt will be I in HEP to improve activity tolerance.   ? Time 3   ? Period Weeks   ? Status On-going   ? Target Date 08/07/21   Pt will not start until next week  ?  ? PT SHORT TERM GOAL #2  ? Title PT balance to improve so that pt is able to balance for 8 seconds on both LE to reduce risk of falling   ? Time 3   ? Period Weeks   ? Status On-going   ?  ? PT SHORT TERM GOAL #3  ? Title Pt B LE and core strength to be increased one grade so that pt is able to come sit to stand without the use of her UE to allow ease of getting out of chairs.   ? Time 3   ? Period Weeks   ? Status On-going   ? ?  ?  ? ?  ? ? ? PT Long Term Goals   ? ?  ? PT LONG TERM GOAL #1  ? Title PT to be I in advanced HEP to improve gait   ? Time 6   ? Period Weeks   ? Status On-going   ? Target Date 08/28/21   ?  ? PT LONG TERM GOAL #2  ? Title Pt core and LE mm strength to be increased by  11/2 grade to allow pt to go up and down 6 steps in a reciprocal manner without having to hold onto the handrail.   ? Time 6   ? Period Weeks   ? Status On-going   ?  ? PT LONG TERM GOAL #3  ? Title Pt to be able to single leg stance for at least 12 seconds bilaterally to allow pt to feel more confident walking on uneven ground   ? Time 6   ? Period Weeks   ? Status On-going   ?  ? PT LONG TERM GOAL #4  ? Title pt to be able to complete 10 sit to stands in 30 seconds to demonstrate improved power to shoot basketballs.   ? Time 6   ? Period Weeks   ? Status On-going   ? ?  ?  ? ?  ? ? ? Plan   ? ? Clinical Impression Statement Treatment continues to focus on strengthening and  balance.  Pt continues to have significant trendelenburg gt due to weak hip mm and poor anterior tib strength affecting gt. Patient uses trunk lateral lean to compensate for hip  weakness with standing balance activities and gait. Patient very weak at the initiation of sit to stand.  Pt will continue to benefit from skilled PT  services to reduce deficits and improve functional lev

## 2021-08-05 ENCOUNTER — Ambulatory Visit (HOSPITAL_COMMUNITY): Payer: 59 | Admitting: Physical Therapy

## 2021-08-05 ENCOUNTER — Telehealth (HOSPITAL_COMMUNITY): Payer: Self-pay | Admitting: Physical Therapy

## 2021-08-05 NOTE — Telephone Encounter (Signed)
Called patient 2nd no show: ?Cancelled all appointments except for her next appointment on 4/1 ? ?Lindsey Murillo, PT CLT ?[phone]  ?

## 2021-08-10 ENCOUNTER — Encounter (HOSPITAL_COMMUNITY): Payer: 59 | Admitting: Physical Therapy

## 2021-08-11 ENCOUNTER — Encounter (HOSPITAL_COMMUNITY): Payer: 59

## 2021-08-11 ENCOUNTER — Ambulatory Visit (HOSPITAL_COMMUNITY): Payer: 59 | Attending: Family Medicine

## 2021-08-11 ENCOUNTER — Encounter (HOSPITAL_COMMUNITY): Payer: Self-pay

## 2021-08-11 DIAGNOSIS — M6281 Muscle weakness (generalized): Secondary | ICD-10-CM | POA: Diagnosis not present

## 2021-08-11 DIAGNOSIS — R2689 Other abnormalities of gait and mobility: Secondary | ICD-10-CM | POA: Insufficient documentation

## 2021-08-11 DIAGNOSIS — R600 Localized edema: Secondary | ICD-10-CM | POA: Diagnosis present

## 2021-08-11 NOTE — Therapy (Signed)
?OUTPATIENT PHYSICAL THERAPY TREATMENT NOTE ? ? ?Patient Name: Lindsey Murillo ?MRN: [id number] ?DOB:04-07-1996, 26 y.o., female ?Today's Date: 08/11/2021 ? ?PCP: Coral Spikes, DO ?REFERRING PROVIDER: Coral Spikes, DO ? ? PT End of Session - 08/11/21 1354   ? ? Visit Number 8   ? Number of Visits 18   ? Date for PT Re-Evaluation 08/25/21   ? Authorization Type Friday [employer] needs auth after 20 visists   ? Authorization - Visit Number 8   ? Authorization - Number of Visits 30   needs auth after 20 visits  ? PT Start Time 1324   late arrival  ? PT Stop Time 1400   ? PT Time Calculation (min) 36 min   ? Activity Tolerance Patient tolerated treatment well;Patient limited by fatigue   ? Behavior During Therapy [facility] for tasks assessed/performed   ? ?  ?  ? ?  ? ? ? ? ? ?Past Medical History:  ?Diagnosis Date  ? Acanthosis nigricans, acquired   ? Asthma   ? Diabetic autonomic neuropathy ([place])   ? Diabetic peripheral neuropathy ([place])   ? Environmental allergies   ? Goiter   ? Hypoglycemia associated with diabetes ([place])   ? Tachycardia   ? Thyroiditis, autoimmune   ? Type 1 diabetes mellitus in patient age 64-19 years with HbA1C goal below 7.5   ? ?Past Surgical History:  ?Procedure Laterality Date  ? BIOPSY  08/27/2016  ? Procedure: BIOPSY;  Surgeon: Danie Binder, MD;  [facility];  Service: Endoscopy;;  duodenum; gastric  ? BIOPSY  06/27/2019  ? Procedure: BIOPSY;  Surgeon: Daneil Dolin, MD;  [facility];  Service: Endoscopy;;  ? COLONOSCOPY    ? ESOPHAGOGASTRODUODENOSCOPY N/A 08/27/2016  ? Dr. Oneida Alar: mild gastritis. Negative celiac. No obvious source for dyspepsia/diarrhea  ? ESOPHAGOGASTRODUODENOSCOPY (EGD) WITH PROPOFOL N/A 06/27/2019  ? rourk: Focal abnormality of the gastric mucosa likely due to trauma (heaving).  Biopsy showed mild gastritis, negative for H. pylori.  Esophageal dilation for history of dysphagia but normal-appearing esophagus.  ? ?Patient Active Problem List  ? Diagnosis  Date Noted  ? Non-adherence to medical treatment 06/29/2021  ? Normocytic anemia 06/26/2021  ? Venous stasis dermatitis 06/17/2021  ? Peripheral edema 06/13/2021  ? Acute renal failure superimposed on stage 4 chronic kidney disease ([place]) 06/12/2021  ? Iron deficiency anemia 06/02/2021  ? Nephrotic syndrome 05/27/2021  ? Chronic pancreatitis ([place]) 08/22/2019  ? Pancreatic pseudocyst/cyst 08/22/2019  ? GERD (gastroesophageal reflux disease) 08/22/2019  ? 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 Vitamin D deficiency 03/20/2019  ? Uncontrolled type 1 diabetes mellitus with hyperglycemia ([place]) 05/12/2015  ? Essential hypertension, benign 11/30/2012  ? Diabetic peripheral neuropathy ([place])   ? Asthma   ? ? ?REFERRING DIAG: Gait Difficulty ? ?THERAPY DIAG:  ?Muscle weakness (generalized) ? ?Other abnormalities of gait and mobility ? ?PERTINENT HISTORY: 1 diabetes, autoimmune thyroiditis, diabetic neuropathy, nephrotic syndrome, hypertension, asthma, and more ? ?PRECAUTIONS: none ? ?SUBJECTIVE:  Late for apt, swollen Rt ear with MD apt later today.  No reoprts of pain.  Does reports 1 fall 2 weeks ago required assistance to stand. ?PAIN:  ?Are you having pain? No ? ? ? ?TODAY'S TREATMENT:  ?08/11/21: ?  Standing: ?  256ft gait ?RTB paloff sidestep walk out 5RT each direction ?Marching alternating 10x each ?Heel raise 20x ?Toe raises incline slope ?SLS 3" max of 5 without HHA ?Hamstring curl 15x 3" ?Squat 10x 3" with HHA  ?  Sidestep 3RT no HHA ?Lunge onto 6" step x 10  ?Sidelying: ?Hip abd partial range 10x 3" ? ?08/03/21 ?Nustep seat 9 lv 2 , arms 8, 6 min for strength and endurance ?Standing: ?             Forearm supported at counter hip extension x 10 B ?             Hip abduction isometric against cabinet x 10 B 3" hold ?             Heel raises x 20 ?             Toe raises at // x 20 ?             Squat x 2 x 10 ?             Slant board stretch 5 x 20" ?             Marching x 10  each leg ?             Side  stepping in // x 3 passes ?             Lunge onto 6" step x 10  ?             SLS x 3 x 10" each one hand hold  ?             Step up 6" B x 10  ?  Lateral step ups 6 inch x 10 each ?             sit to stand 2  x 5 with emphasis on shifting weight forward to stand ? ? ?07/31/2021 ?          Standing: ?             Forearm supported at counter hip extension x 10 B ?             Hip abduction isometric against cabinet x 10 B ?             Heel raises x 15 ?             Toe raises at // on slant x 15 ?             Squat x 15 ?             Slant board stretch 3 x 30  ?             Marching x 10   ?             Side stepping in // x 3  ?             Lunge onto 6" step x 10  ?             SLS x 3 one hand hold  ?             Step up 6" B x 10  ?   ? ?         ?             sit to stand x 5 ?             ? ?07/29/21 ?Nustep seat 9 lv 2 , arms 8, 6 min for strength and endurance ?Heel raise x20 ?Toe raise x20 ?Slant board gastroc stretch 5 x 20" ?Step taps 6 inch  x20 ?Step ups 6 inch x 10 each alternating ?Lateral step ups 6 inch x 10 each ?Sit to stand  2 x 10 (min use of Ues) needs verbal cues for technique ?Staggered stance 2 x 15"  ?Heel toe walking in parallel bars x 4 passes, cga ?Standing on foam, diagonals with red med ball x 10 each way ? ? ? ?07/28/21 ?Nustep seat 9 lv 2 6 min for strength and endurance ?Heel raise x20 ?Toe raise x20 ?Step taps 6 inch x20 ?Standing hip abduction 2 x 10 ?Standing hip extension 2 x10 ?Step ups 6 inch x15 ?Sit to stand (elevated surface with black foam pad) 2 x 10 (min use of Ues)  ?Staggered stance 3 x 20"  ? ?07/23/21 ?Aerobic = Nustep 6 minutes, level 1, seat 8, warm up and endurance ?Therapeutic exercises =  ?Standing in parallel bars - all with cueing for core activation, tall posture with glut squeeze on WB leg ?Heel raise 1x 10    ?Toe raise 1x 10 alternating to focus on as big a motion as able ?Alternating march 2x 10 bilateral - second round focused on stance leg hold 3  seconds ?Hip abduction 2x 10 bilateral  ?Hip extension 2x 10 bilateral  ?STS 2 x 5 with UE support on chair and black balance foam under buttock for focus on increased B LE work and eccentric control ?Seated (new) -  ?LAQ 1 x 10 each leg  ?Hip adduction with blue gym ball between knees - 2 x 10 cued for core and pelvic floor activation  ? ? ?PATIENT EDUCATION: ?Education details: on exercise form and function ?Person educated: Patient ?Education method: Explanation and Demonstration ?Education comprehension: verbalized understanding and returned demonstration ? ? ?HOME EXERCISE PROGRAM: ?Access Code: [email or web address] ?URL: [email or web address] ?Date: 07/28/2021 ?Prepared by: Josue Hector ? ?Exercises ?Standing Tandem Balance with Counter Support - 2-3 x daily - 7 x weekly - 1 sets - 4 reps - 20-30 second hold ?Sit to Stand with Counter Support - 2-3 x daily - 7 x weekly - 2 sets - 10 reps ?sitting ankle dorsi/plantarflexion, bridge, bent hip flexion/abduction B 3/14 march, hip abd, hip ext ?                       3/24:  side stepping at counter, marching  ? PT Short Term Goals   ? ?  ? PT SHORT TERM GOAL #1  ? Title Pt will be I in HEP to improve activity tolerance.   ? Time 3   ? Period Weeks   ? Status On-going   ? Target Date 08/07/21   Pt will not start until next week  ?  ? PT SHORT TERM GOAL #2  ? Title PT balance to improve so that pt is able to balance for 8 seconds on both LE to reduce risk of falling   ? Time 3   ? Period Weeks   ? Status On-going   ?  ? PT SHORT TERM GOAL #3  ? Title Pt B LE and core strength to be increased one grade so that pt is able to come sit to stand without the use of her UE to allow ease of getting out of chairs.   ? Time 3   ? Period Weeks   ? Status On-going   ? ?  ?  ? ?  ? ? ? PT Long Term Goals   ? ?  ? PT LONG TERM GOAL #  1  ? Title PT to be I in advanced HEP to improve gait   ? Time 6   ? Period Weeks   ? Status On-going   ? Target Date 08/28/21   ?  ? PT LONG  TERM GOAL #2  ? Title Pt core and LE mm strength to be increased by 11/2 grade to allow pt to go up and down 6 steps in a reciprocal manner without having to hold onto the handrail.   ? Time 6   ? Period Weeks   ? St

## 2021-08-12 ENCOUNTER — Encounter (HOSPITAL_COMMUNITY): Payer: 59 | Admitting: Physical Therapy

## 2021-08-12 ENCOUNTER — Encounter (HOSPITAL_COMMUNITY): Payer: 59

## 2021-08-14 ENCOUNTER — Ambulatory Visit (HOSPITAL_COMMUNITY): Payer: 59

## 2021-08-14 ENCOUNTER — Encounter (HOSPITAL_COMMUNITY): Payer: Self-pay

## 2021-08-14 DIAGNOSIS — M6281 Muscle weakness (generalized): Secondary | ICD-10-CM | POA: Diagnosis not present

## 2021-08-14 DIAGNOSIS — R2689 Other abnormalities of gait and mobility: Secondary | ICD-10-CM

## 2021-08-14 NOTE — Therapy (Signed)
?OUTPATIENT PHYSICAL THERAPY TREATMENT NOTE ? ? ?Patient Name: Lindsey Murillo ?MRN: [id number] ?DOB:1996/01/16, 26 y.o., female ?Today's Date: 08/14/2021 ? ?PCP: Coral Spikes, DO ?REFERRING PROVIDER: Coral Spikes, DO ? ? PT End of Session - 08/14/21 1757   ? ? Visit Number 9   ? Number of Visits 18   ? Date for PT Re-Evaluation 08/25/21   ? Authorization Type Friday [employer] needs auth after 20 visists   ? Authorization - Visit Number 9   ? Authorization - Number of Visits 30   needs auth following 20 visits  ? PT Start Time 4098   ? Activity Tolerance Patient limited by pain;Patient limited by fatigue;Patient tolerated treatment well   ? Behavior During Therapy [facility] for tasks assessed/performed   ? ?  ?  ? ?  ? ? ? ? ? ?Past Medical History:  ?Diagnosis Date  ? Acanthosis nigricans, acquired   ? Asthma   ? Diabetic autonomic neuropathy ([place])   ? Diabetic peripheral neuropathy ([place])   ? Environmental allergies   ? Goiter   ? Hypoglycemia associated with diabetes ([place])   ? Tachycardia   ? Thyroiditis, autoimmune   ? Type 1 diabetes mellitus in patient age 27-19 years with HbA1C goal below 7.5   ? ?Past Surgical History:  ?Procedure Laterality Date  ? BIOPSY  08/27/2016  ? Procedure: BIOPSY;  Surgeon: Danie Binder, MD;  [facility];  Service: Endoscopy;;  duodenum; gastric  ? BIOPSY  06/27/2019  ? Procedure: BIOPSY;  Surgeon: Daneil Dolin, MD;  [facility];  Service: Endoscopy;;  ? COLONOSCOPY    ? ESOPHAGOGASTRODUODENOSCOPY N/A 08/27/2016  ? Dr. Oneida Alar: mild gastritis. Negative celiac. No obvious source for dyspepsia/diarrhea  ? ESOPHAGOGASTRODUODENOSCOPY (EGD) WITH PROPOFOL N/A 06/27/2019  ? rourk: Focal abnormality of the gastric mucosa likely due to trauma (heaving).  Biopsy showed mild gastritis, negative for H. pylori.  Esophageal dilation for history of dysphagia but normal-appearing esophagus.  ? ?Patient Active Problem List  ? Diagnosis Date Noted  ? Non-adherence to medical  treatment 06/29/2021  ? Normocytic anemia 06/26/2021  ? Venous stasis dermatitis 06/17/2021  ? Peripheral edema 06/13/2021  ? Acute renal failure superimposed on stage 4 chronic kidney disease ([place]) 06/12/2021  ? Iron deficiency anemia 06/02/2021  ? Nephrotic syndrome 05/27/2021  ? Chronic pancreatitis ([place]) 08/22/2019  ? Pancreatic pseudocyst/cyst 08/22/2019  ? GERD (gastroesophageal reflux disease) 08/22/2019  ? 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 Vitamin D deficiency 03/20/2019  ? Uncontrolled type 1 diabetes mellitus with hyperglycemia ([place]) 05/12/2015  ? Essential hypertension, benign 11/30/2012  ? Diabetic peripheral neuropathy ([place])   ? Asthma   ? ? ?REFERRING DIAG: Gait Difficulty ? ?THERAPY DIAG:  ?Muscle weakness (generalized) ? ?Other abnormalities of gait and mobility ? ?PERTINENT HISTORY: 1 diabetes, autoimmune thyroiditis, diabetic neuropathy, nephrotic syndrome, hypertension, asthma, and more ? ?PRECAUTIONS: none ? ?SUBJECTIVE:  Reports increased pain LBP today, pain scale 7/10 sore achey pain.   ?PAIN:  ?PAIN:  ?Are you having pain? Yes: NPRS scale: 7/10 ?Pain location: LBP ?Pain description: sore achey pain ?Aggravating factors: unsure, possibly way she sleeps ?Relieving factors: unsure ? ? ? ? ?TODAY'S TREATMENT:  ?08/14/21: ?Supine: bridge 15x 5" ? 90/90 bent knee raise ?Quadruped UE with ab set 10x5" ? LE partial range 10x 5" ?Prone: SLR 10x 5" ?Sidelying: AAROM abd 10x 5" ?10STS elevated height with no/minimal 1 UE support ?Standing: ? Forward lunge ? Side lunge on 6in step ?  Squats 10x5" ? ? ?08/11/21: ?  Standing: ?  260ft gait ?RTB paloff sidestep walk out 5RT each direction ?Marching alternating 10x each ?Heel raise 20x ?Toe raises incline slope ?SLS 3" max of 5 without HHA ?Hamstring curl 15x 3" ?Squat 10x 3" with HHA  ?Sidestep 3RT no HHA ?Lunge onto 6" step x 10  ?Sidelying: ?Hip abd partial range 10x 3" ? ?08/03/21 ?Nustep seat 9 lv 2 , arms 8, 6 min for strength and  endurance ?Standing: ?             Forearm supported at counter hip extension x 10 B ?             Hip abduction isometric against cabinet x 10 B 3" hold ?             Heel raises x 20 ?             Toe raises at // x 20 ?             Squat x 2 x 10 ?             Slant board stretch 5 x 20" ?             Marching x 10  each leg ?             Side stepping in // x 3 passes ?             Lunge onto 6" step x 10  ?             SLS x 3 x 10" each one hand hold  ?             Step up 6" B x 10  ?  Lateral step ups 6 inch x 10 each ?             sit to stand 2  x 5 with emphasis on shifting weight forward to stand ? ? ?07/31/2021 ?          Standing: ?             Forearm supported at counter hip extension x 10 B ?             Hip abduction isometric against cabinet x 10 B ?             Heel raises x 15 ?             Toe raises at // on slant x 15 ?             Squat x 15 ?             Slant board stretch 3 x 30  ?             Marching x 10   ?             Side stepping in // x 3  ?             Lunge onto 6" step x 10  ?             SLS x 3 one hand hold  ?             Step up 6" B x 10  ?   ? ?         ?             sit to stand  x 5 ?             ? ?07/29/21 ?Nustep seat 9 lv 2 , arms 8, 6 min for strength and endurance ?Heel raise x20 ?Toe raise x20 ?Slant board gastroc stretch 5 x 20" ?Step taps 6 inch x20 ?Step ups 6 inch x 10 each alternating ?Lateral step ups 6 inch x 10 each ?Sit to stand  2 x 10 (min use of Ues) needs verbal cues for technique ?Staggered stance 2 x 15"  ?Heel toe walking in parallel bars x 4 passes, cga ?Standing on foam, diagonals with red med ball x 10 each way ? ? ? ?07/28/21 ?Nustep seat 9 lv 2 6 min for strength and endurance ?Heel raise x20 ?Toe raise x20 ?Step taps 6 inch x20 ?Standing hip abduction 2 x 10 ?Standing hip extension 2 x10 ?Step ups 6 inch x15 ?Sit to stand (elevated surface with black foam pad) 2 x 10 (min use of Ues)  ?Staggered stance 3 x 20"  ? ?07/23/21 ?Aerobic = Nustep 6  minutes, level 1, seat 8, warm up and endurance ?Therapeutic exercises =  ?Standing in parallel bars - all with cueing for core activation, tall posture with glut squeeze on WB leg ?Heel raise 1x 10    ?Toe raise 1x 10 alternating to focus on as big a motion as able ?Alternating march 2x 10 bilateral - second round focused on stance leg hold 3 seconds ?Hip abduction 2x 10 bilateral  ?Hip extension 2x 10 bilateral  ?STS 2 x 5 with UE support on chair and black balance foam under buttock for focus on increased B LE work and eccentric control ?Seated (new) -  ?LAQ 1 x 10 each leg  ?Hip adduction with blue gym ball between knees - 2 x 10 cued for core and pelvic floor activation  ? ? ?PATIENT EDUCATION: ?Education details: on exercise form and function ?Person educated: Patient ?Education method: Explanation and Demonstration ?Education comprehension: verbalized understanding and returned demonstration ? ? ?HOME EXERCISE PROGRAM: ?Access Code: [email or web address] ?URL: [email or web address] ?Date: 07/28/2021 ?Prepared by: Josue Hector ? ?Exercises ?Standing Tandem Balance with Counter Support - 2-3 x daily - 7 x weekly - 1 sets - 4 reps - 20-30 second hold ?Sit to Stand with Counter Support - 2-3 x daily - 7 x weekly - 2 sets - 10 reps ?sitting ankle dorsi/plantarflexion, bridge, bent hip flexion/abduction B 3/14 march, hip abd, hip ext ?                       3/24:  side stepping at counter, marching  ? PT Short Term Goals   ? ?  ? PT SHORT TERM GOAL #1  ? Title Pt will be I in HEP to improve activity tolerance.   ? Time 3   ? Period Weeks   ? Status On-going   ? Target Date 08/07/21   Pt will not start until next week  ?  ? PT SHORT TERM GOAL #2  ? Title PT balance to improve so that pt is able to balance for 8 seconds on both LE to reduce risk of falling   ? Time 3   ? Period Weeks   ? Status On-going   ?  ? PT SHORT TERM GOAL #3  ? Title Pt B LE and core strength to be increased one grade so that pt is able to  come sit to stand without the use of her UE to  allow ease of getting out of chairs.   ? Time 3   ? Period Weeks   ? Status On-going   ? ?  ?  ? ?  ? ? ? PT Long Term Goals   ? ?  ? PT LONG TERM GOAL #1  ? Title PT to be I

## 2021-08-17 ENCOUNTER — Encounter (HOSPITAL_COMMUNITY): Payer: 59 | Admitting: Physical Therapy

## 2021-08-18 ENCOUNTER — Encounter (HOSPITAL_COMMUNITY): Payer: 59

## 2021-08-19 ENCOUNTER — Encounter (HOSPITAL_COMMUNITY): Payer: 59

## 2021-08-19 ENCOUNTER — Ambulatory Visit (HOSPITAL_COMMUNITY): Payer: 59

## 2021-08-19 ENCOUNTER — Encounter (HOSPITAL_COMMUNITY): Payer: Self-pay

## 2021-08-19 DIAGNOSIS — M6281 Muscle weakness (generalized): Secondary | ICD-10-CM | POA: Diagnosis not present

## 2021-08-19 DIAGNOSIS — R600 Localized edema: Secondary | ICD-10-CM

## 2021-08-19 DIAGNOSIS — R2689 Other abnormalities of gait and mobility: Secondary | ICD-10-CM

## 2021-08-19 NOTE — Therapy (Addendum)
?OUTPATIENT PHYSICAL THERAPY TREATMENT NOTE ?Progress Note ?Reporting Period 07/14/21 to 08/19/21 ? ?See note below for Objective Data and Assessment of Progress/Goals.  ? ? ? ?Patient Name: Lindsey Murillo ?MRN: [id number] ?DOB:1996-01-03, 26 y.o., female ?Today's Date: 08/19/2021 ? ?PCP: Coral Spikes, DO ?REFERRING PROVIDER: Coral Spikes, DO ? ? PT End of Session - 08/19/21 1446   ? ? Visit Number 10   ? Number of Visits 18   ? Date for PT Re-Evaluation 08/25/21   ? Authorization Type Friday [employer] needs auth after 20 visists   ? Authorization - Visit Number 10   ? Authorization - Number of Visits 30   ? PT Start Time 1402   ? PT Stop Time 4098   ? PT Time Calculation (min) 42 min   ? Activity Tolerance Patient tolerated treatment well   ? Behavior During Therapy [facility] for tasks assessed/performed   ? ?  ?  ? ?  ? ? ? ? ? ? ?Past Medical History:  ?Diagnosis Date  ? Acanthosis nigricans, acquired   ? Asthma   ? Diabetic autonomic neuropathy ([place])   ? Diabetic peripheral neuropathy ([place])   ? Environmental allergies   ? Goiter   ? Hypoglycemia associated with diabetes ([place])   ? Tachycardia   ? Thyroiditis, autoimmune   ? Type 1 diabetes mellitus in patient age 17-19 years with HbA1C goal below 7.5   ? ?Past Surgical History:  ?Procedure Laterality Date  ? BIOPSY  08/27/2016  ? Procedure: BIOPSY;  Surgeon: Danie Binder, MD;  [facility];  Service: Endoscopy;;  duodenum; gastric  ? BIOPSY  06/27/2019  ? Procedure: BIOPSY;  Surgeon: Daneil Dolin, MD;  [facility];  Service: Endoscopy;;  ? COLONOSCOPY    ? ESOPHAGOGASTRODUODENOSCOPY N/A 08/27/2016  ? Dr. Oneida Alar: mild gastritis. Negative celiac. No obvious source for dyspepsia/diarrhea  ? ESOPHAGOGASTRODUODENOSCOPY (EGD) WITH PROPOFOL N/A 06/27/2019  ? rourk: Focal abnormality of the gastric mucosa likely due to trauma (heaving).  Biopsy showed mild gastritis, negative for H. pylori.  Esophageal dilation for history of dysphagia but  normal-appearing esophagus.  ? ?Patient Active Problem List  ? Diagnosis Date Noted  ? Non-adherence to medical treatment 06/29/2021  ? Normocytic anemia 06/26/2021  ? Venous stasis dermatitis 06/17/2021  ? Peripheral edema 06/13/2021  ? Acute renal failure superimposed on stage 4 chronic kidney disease ([place]) 06/12/2021  ? Iron deficiency anemia 06/02/2021  ? Nephrotic syndrome 05/27/2021  ? Chronic pancreatitis ([place]) 08/22/2019  ? Pancreatic pseudocyst/cyst 08/22/2019  ? GERD (gastroesophageal reflux disease) 08/22/2019  ? 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 Vitamin D deficiency 03/20/2019  ? Uncontrolled type 1 diabetes mellitus with hyperglycemia ([place]) 05/12/2015  ? Essential hypertension, benign 11/30/2012  ? Diabetic peripheral neuropathy ([place])   ? Asthma   ? ? ?REFERRING DIAG: Gait Difficulty ? ?THERAPY DIAG:  ?Muscle weakness (generalized) ? ?Other abnormalities of gait and mobility ? ?PERTINENT HISTORY: 1 diabetes, autoimmune thyroiditis, diabetic neuropathy, nephrotic syndrome, hypertension, asthma, and more ? ?PRECAUTIONS: none ? ?SUBJECTIVE:  Reports she felt good following last session, has began some of the back exercises at home.  No reoprts of pain currently.  Feels she has improved by 30% ?PAIN:  ?PAIN:  ?Are you having pain? Yes: NPRS scale: 0/10 ?Pain location: LBP ?Pain description: sore achey pain ?Aggravating factors: unsure, possibly way she sleeps ?Relieving factors: unsure ? ? ? ? ?TODAY'S TREATMENT:  ?08/18/21:  ?  2MWT 522 ft   ?  MMT ?  5 STS required 2 thumb assistance 31.77" ? Standing: squat with no HHA 10x3" ? Supine: Bridge ? Sidelying: AAROM abd ? Educated benefits with compression garments, measured and given handout for ETI/[employer] purchase knee high ?  ?   ? ?08/14/21: ?Supine: bridge 15x 5" ? 90/90 bent knee raise ?Quadruped UE with ab set 10x5" ? LE partial range 10x 5" ?Prone: SLR 10x 5" ?Sidelying: AAROM abd 10x 5" ?10STS elevated height with no/minimal 1 UE  support ?Standing: ? Forward lunge ? Side lunge on 6in step ? Squats 10x5" ? ? ?08/11/21: ?  Standing: ?  259ft gait ?RTB paloff sidestep walk out 5RT each direction ?Marching alternating 10x each ?Heel raise 20x ?Toe raises incline slope ?SLS 3" max of 5 without HHA ?Hamstring curl 15x 3" ?Squat 10x 3" with HHA  ?Sidestep 3RT no HHA ?Lunge onto 6" step x 10  ?Sidelying: ?Hip abd partial range 10x 3" ? ?08/03/21 ?Nustep seat 9 lv 2 , arms 8, 6 min for strength and endurance ?Standing: ?             Forearm supported at counter hip extension x 10 B ?             Hip abduction isometric against cabinet x 10 B 3" hold ?             Heel raises x 20 ?             Toe raises at // x 20 ?             Squat x 2 x 10 ?             Slant board stretch 5 x 20" ?             Marching x 10  each leg ?             Side stepping in // x 3 passes ?             Lunge onto 6" step x 10  ?             SLS x 3 x 10" each one hand hold  ?             Step up 6" B x 10  ?  Lateral step ups 6 inch x 10 each ?             sit to stand 2  x 5 with emphasis on shifting weight forward to stand ? ? ?07/31/2021 ?          Standing: ?             Forearm supported at counter hip extension x 10 B ?             Hip abduction isometric against cabinet x 10 B ?             Heel raises x 15 ?             Toe raises at // on slant x 15 ?             Squat x 15 ?             Slant board stretch 3 x 30  ?             Marching x 10   ?             Side  stepping in // x 3  ?             Lunge onto 6" step x 10  ?             SLS x 3 one hand hold  ?             Step up 6" B x 10  ?   ? ?         ?             sit to stand x 5 ?             ? ?07/29/21 ?Nustep seat 9 lv 2 , arms 8, 6 min for strength and endurance ?Heel raise x20 ?Toe raise x20 ?Slant board gastroc stretch 5 x 20" ?Step taps 6 inch x20 ?Step ups 6 inch x 10 each alternating ?Lateral step ups 6 inch x 10 each ?Sit to stand  2 x 10 (min use of Ues) needs verbal cues for technique ?Staggered  stance 2 x 15"  ?Heel toe walking in parallel bars x 4 passes, cga ?Standing on foam, diagonals with red med ball x 10 each way ? ? ? ?07/28/21 ?Nustep seat 9 lv 2 6 min for strength and endurance ?Heel raise x20 ?Toe raise x20 ?Step taps 6 inch x20 ?Standing hip abduction 2 x 10 ?Standing hip extension 2 x10 ?Step ups 6 inch x15 ?Sit to stand (elevated surface with black foam pad) 2 x 10 (min use of Ues)  ?Staggered stance 3 x 20"  ? ?07/23/21 ?Aerobic = Nustep 6 minutes, level 1, seat 8, warm up and endurance ?Therapeutic exercises =  ?Standing in parallel bars - all with cueing for core activation, tall posture with glut squeeze on WB leg ?Heel raise 1x 10    ?Toe raise 1x 10 alternating to focus on as big a motion as able ?Alternating march 2x 10 bilateral - second round focused on stance leg hold 3 seconds ?Hip abduction 2x 10 bilateral  ?Hip extension 2x 10 bilateral  ?STS 2 x 5 with UE support on chair and black balance foam under buttock for focus on increased B LE work and eccentric control ?Seated (new) -  ?LAQ 1 x 10 each leg  ?Hip adduction with blue gym ball between knees - 2 x 10 cued for core and pelvic floor activation  ? ? ?PATIENT EDUCATION: ?Education details: Reviewed goals, objective findings.  Educated benefits with compression garments, measured and given handout for ETI/[employer] purchase knee hig  ?Person educated: Patient ?Education method: Explanation and Demonstration ?Education comprehension: verbalized understanding and returned demonstration ? ? ?HOME EXERCISE PROGRAM: ?Access Code: [email or web address] ?URL: [email or web address] ?Date: 07/28/2021 ?Prepared by: Josue Hector ? ?Exercises ?Standing Tandem Balance with Counter Support - 2-3 x daily - 7 x weekly - 1 sets - 4 reps - 20-30 second hold ?Sit to Stand with Counter Support - 2-3 x daily - 7 x weekly - 2 sets - 10 reps ?sitting ankle dorsi/plantarflexion, bridge, bent hip flexion/abduction B 3/14 march, hip abd, hip ext ?                        3/24:  side stepping at counter, marching  ? ? ? ? Eval 07/14/21 08/19/21  ?Assessment   ?Medical Diagnosis gait difficulty   ?Referring Provider (PT) Amado Nash   ?Prior Therapy none   ?Precautions   ?Precautions

## 2021-08-20 ENCOUNTER — Encounter (HOSPITAL_COMMUNITY): Payer: Self-pay | Admitting: Hematology

## 2021-08-20 ENCOUNTER — Encounter (HOSPITAL_COMMUNITY): Payer: 59 | Admitting: Physical Therapy

## 2021-08-24 ENCOUNTER — Encounter (HOSPITAL_COMMUNITY): Payer: 59 | Admitting: Physical Therapy

## 2021-08-25 ENCOUNTER — Encounter (HOSPITAL_COMMUNITY): Payer: Self-pay | Admitting: Hematology

## 2021-08-25 ENCOUNTER — Ambulatory Visit (HOSPITAL_COMMUNITY): Payer: 59 | Admitting: Physical Therapy

## 2021-08-25 ENCOUNTER — Encounter (HOSPITAL_COMMUNITY): Payer: 59 | Admitting: Physical Therapy

## 2021-08-26 ENCOUNTER — Encounter (HOSPITAL_COMMUNITY): Payer: Self-pay

## 2021-08-26 ENCOUNTER — Ambulatory Visit (HOSPITAL_COMMUNITY): Payer: 59

## 2021-08-26 ENCOUNTER — Encounter (HOSPITAL_COMMUNITY): Payer: 59

## 2021-08-26 DIAGNOSIS — R2689 Other abnormalities of gait and mobility: Secondary | ICD-10-CM

## 2021-08-26 DIAGNOSIS — M6281 Muscle weakness (generalized): Secondary | ICD-10-CM

## 2021-08-26 NOTE — Therapy (Signed)
?OUTPATIENT PHYSICAL THERAPY TREATMENT NOTE ? ?Patient Name: Lindsey Murillo ?MRN: [id number] ?DOB:[birth year], 26 y.o., female ?Today's Date: 08/26/2021 ? ?PCP: Coral Spikes, DO ?REFERRING PROVIDER: Coral Spikes, DO ? ? PT End of Session - 08/26/21 1627   ? ? Visit Number 11   ? Number of Visits 18   ? Date for PT Re-Evaluation 08/25/21   ? Authorization Type Friday [employer] needs auth after 20 visists   ? Authorization - Visit Number 10   ? Authorization - Number of Visits 30   ? PT Start Time [phone]   ? PT Stop Time 8185   ? PT Time Calculation (min) 40 min   ? Activity Tolerance Patient tolerated treatment well   ? Behavior During Therapy [facility] for tasks assessed/performed   ? ?  ?  ? ?  ? ? ? ? ? ? ? ?Past Medical History:  ?Diagnosis Date  ? Acanthosis nigricans, acquired   ? Asthma   ? Diabetic autonomic neuropathy ([place])   ? Diabetic peripheral neuropathy ([place])   ? Environmental allergies   ? Goiter   ? Hypoglycemia associated with diabetes ([place])   ? Tachycardia   ? Thyroiditis, autoimmune   ? Type 1 diabetes mellitus in patient age 58-19 years with HbA1C goal below 7.5   ? ?Past Surgical History:  ?Procedure Laterality Date  ? BIOPSY  08/27/2016  ? Procedure: BIOPSY;  Surgeon: Danie Binder, MD;  [facility];  Service: Endoscopy;;  duodenum; gastric  ? BIOPSY  06/27/2019  ? Procedure: BIOPSY;  Surgeon: Daneil Dolin, MD;  [facility];  Service: Endoscopy;;  ? COLONOSCOPY    ? ESOPHAGOGASTRODUODENOSCOPY N/A 08/27/2016  ? Dr. Oneida Alar: mild gastritis. Negative celiac. No obvious source for dyspepsia/diarrhea  ? ESOPHAGOGASTRODUODENOSCOPY (EGD) WITH PROPOFOL N/A 06/27/2019  ? rourk: Focal abnormality of the gastric mucosa likely due to trauma (heaving).  Biopsy showed mild gastritis, negative for H. pylori.  Esophageal dilation for history of dysphagia but normal-appearing esophagus.  ? ?Patient Active Problem List  ? Diagnosis Date Noted  ? Non-adherence to medical treatment 06/29/2021  ?  Normocytic anemia 06/26/2021  ? Venous stasis dermatitis 06/17/2021  ? Peripheral edema 06/13/2021  ? Acute renal failure superimposed on stage 4 chronic kidney disease ([place]) 06/12/2021  ? Iron deficiency anemia 06/02/2021  ? Nephrotic syndrome 05/27/2021  ? Chronic pancreatitis ([place]) 08/22/2019  ? Pancreatic pseudocyst/cyst 08/22/2019  ? GERD (gastroesophageal reflux disease) 08/22/2019  ? 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 Vitamin D deficiency 03/20/2019  ? Uncontrolled type 1 diabetes mellitus with hyperglycemia ([place]) 05/12/2015  ? Essential hypertension, benign 11/30/2012  ? Diabetic peripheral neuropathy ([place])   ? Asthma   ? ? ?REFERRING DIAG: Gait Difficulty ? ?THERAPY DIAG:  ?Muscle weakness (generalized) ? ?Other abnormalities of gait and mobility ? ?PERTINENT HISTORY: 1 diabetes, autoimmune thyroiditis, diabetic neuropathy, nephrotic syndrome, hypertension, asthma, and more ? ?PRECAUTIONS: none ? ?SUBJECTIVE:  Had to go to ER last week due to muscle spasm Lt UE, pain scale 3/10 intermittent sore. ?PAIN:  ?PAIN:  ?Are you having pain? Yes: NPRS scale: 3/10 ?Pain location: Lt shoulder ?Pain description: sore achey pain ?Aggravating factors: unsure, possibly way she sleeps ?Relieving factors: unsure ? ? ? ? ?TODAY'S TREATMENT:  ?08/26/21: ?Marching alternating 10x each ?Heel raise 20x ?Isometric abd against wall 2x 10 5" holds (Cueing to reduce side bend) ?STS no HHA, elevated height 25in 1st set WBOS, 2nd set NBOS ?Lunge onto 6" step x 10  ?  Forearm supported at counter hip extension x 10 B ?Tandem stance 3x 30 ? ?08/18/21:  ?  2MWT 522 ft   ?  MMT ?  5 STS required 2 thumb assistance 31.77" ? Standing: squat with no HHA 10x3" ? Supine: Bridge ? Sidelying: AAROM abd ? Educated benefits with compression garments, measured and given handout for ETI/[employer] purchase knee high ?  ?   ? ?08/14/21: ?Supine: bridge 15x 5" ? 90/90 bent knee raise ?Quadruped UE with ab set 10x5" ? LE partial range 10x  5" ?Prone: SLR 10x 5" ?Sidelying: AAROM abd 10x 5" ?10STS elevated height with no/minimal 1 UE support ?Standing: ? Forward lunge ? Side lunge on 6in step ? Squats 10x5" ? ? ?08/11/21: ?  Standing: ?  227ft gait ?RTB paloff sidestep walk out 5RT each direction ?Marching alternating 10x each ?Heel raise 20x ?Toe raises incline slope ?SLS 3" max of 5 without HHA ?Hamstring curl 15x 3" ?Squat 10x 3" with HHA  ?Sidestep 3RT no HHA ?Lunge onto 6" step x 10  ?Sidelying: ?Hip abd partial range 10x 3" ? ?08/03/21 ?Nustep seat 9 lv 2 , arms 8, 6 min for strength and endurance ?Standing: ?             Forearm supported at counter hip extension x 10 B ?             Hip abduction isometric against cabinet x 10 B 3" hold ?             Heel raises x 20 ?             Toe raises at // x 20 ?             Squat x 2 x 10 ?             Slant board stretch 5 x 20" ?             Marching x 10  each leg ?             Side stepping in // x 3 passes ?             Lunge onto 6" step x 10  ?             SLS x 3 x 10" each one hand hold  ?             Step up 6" B x 10  ?  Lateral step ups 6 inch x 10 each ?             sit to stand 2  x 5 with emphasis on shifting weight forward to stand ? ? ?07/31/2021 ?          Standing: ?             Forearm supported at counter hip extension x 10 B ?             Hip abduction isometric against cabinet x 10 B ?             Heel raises x 15 ?             Toe raises at // on slant x 15 ?             Squat x 15 ?             Slant board stretch 3 x 30  ?  Marching x 10   ?             Side stepping in // x 3  ?             Lunge onto 6" step x 10  ?             SLS x 3 one hand hold  ?             Step up 6" B x 10  ?   ? ?         ?             sit to stand x 5 ?             ? ?07/29/21 ?Nustep seat 9 lv 2 , arms 8, 6 min for strength and endurance ?Heel raise x20 ?Toe raise x20 ?Slant board gastroc stretch 5 x 20" ?Step taps 6 inch x20 ?Step ups 6 inch x 10 each alternating ?Lateral step ups 6 inch  x 10 each ?Sit to stand  2 x 10 (min use of Ues) needs verbal cues for technique ?Staggered stance 2 x 15"  ?Heel toe walking in parallel bars x 4 passes, cga ?Standing on foam, diagonals with red med ball x 10 each way ? ? ? ?07/28/21 ?Nustep seat 9 lv 2 6 min for strength and endurance ?Heel raise x20 ?Toe raise x20 ?Step taps 6 inch x20 ?Standing hip abduction 2 x 10 ?Standing hip extension 2 x10 ?Step ups 6 inch x15 ?Sit to stand (elevated surface with black foam pad) 2 x 10 (min use of Ues)  ?Staggered stance 3 x 20"  ? ?07/23/21 ?Aerobic = Nustep 6 minutes, level 1, seat 8, warm up and endurance ?Therapeutic exercises =  ?Standing in parallel bars - all with cueing for core activation, tall posture with glut squeeze on WB leg ?Heel raise 1x 10    ?Toe raise 1x 10 alternating to focus on as big a motion as able ?Alternating march 2x 10 bilateral - second round focused on stance leg hold 3 seconds ?Hip abduction 2x 10 bilateral  ?Hip extension 2x 10 bilateral  ?STS 2 x 5 with UE support on chair and black balance foam under buttock for focus on increased B LE work and eccentric control ?Seated (new) -  ?LAQ 1 x 10 each leg  ?Hip adduction with blue gym ball between knees - 2 x 10 cued for core and pelvic floor activation  ? ? ?PATIENT EDUCATION: ?Education details: Reviewed goals, objective findings.  Educated benefits with compression garments, measured and given handout for ETI/[employer] purchase knee hig  ?Person educated: Patient ?Education method: Explanation and Demonstration ?Education comprehension: verbalized understanding and returned demonstration ? ? ?HOME EXERCISE PROGRAM: ?Access Code: [email or web address] ?URL: [email or web address] ?Date: 07/28/2021 ?Prepared by: Josue Hector ? ?Exercises ?Standing Tandem Balance with Counter Support - 2-3 x daily - 7 x weekly - 1 sets - 4 reps - 20-30 second hold ?Sit to Stand with Counter Support - 2-3 x daily - 7 x weekly - 2 sets - 10 reps ?sitting ankle  dorsi/plantarflexion, bridge, bent hip flexion/abduction B 3/14 march, hip abd, hip ext ?                       3/24:  side stepping at counter, marching  ?  4/19: Access Code: [email or web address] Tandem stance by counter, stan

## 2021-08-27 ENCOUNTER — Ambulatory Visit (INDEPENDENT_AMBULATORY_CARE_PROVIDER_SITE_OTHER): Payer: 59 | Admitting: Internal Medicine

## 2021-08-27 VITALS — BP 140/86 | HR 86 | Temp 97.3°F | Ht 67.0 in | Wt 133.4 lb

## 2021-08-27 DIAGNOSIS — R1032 Left lower quadrant pain: Secondary | ICD-10-CM | POA: Diagnosis not present

## 2021-08-27 DIAGNOSIS — D509 Iron deficiency anemia, unspecified: Secondary | ICD-10-CM

## 2021-08-27 NOTE — Patient Instructions (Signed)
We will schedule you for colonoscopy to further evaluate your anemia.  Your blood counts are improved as of late which is reassuring. ? ?Further recommendations to follow. ? ?It was very nice meeting you today. ? ?Happy early birthday. ? ?Dr. Abbey Chatters ? ?At [facility] we value your feedback. You may receive a survey about your visit today. Please share your experience as we strive to create trusting relationships with our patients to provide genuine, compassionate, quality care. ? ?We appreciate your understanding and patience as we review any laboratory studies, imaging, and other diagnostic tests that are ordered as we care for you. Our office policy is 5 business days for review of these results, and any emergent or urgent results are addressed in a timely manner for your best interest. If you do not hear from our office in 1 week, please contact us.  ? ?We also encourage the use of MyChart, which contains your medical information for your review as well. If you are not enrolled in this feature, an access code is on this after visit summary for your convenience. Thank you for allowing [place] to be involved in your care. ? ?It was great to see you today!  I hope you have a great rest of your Spring! ? ? ? ?Lindsey Murillo. Abbey Chatters, D.O. ?Gastroenterology and Hepatology ?[facility] ? ?

## 2021-08-27 NOTE — Progress Notes (Signed)
? ? ?Referring Provider: Coral Spikes, DO ?Primary Care Physician:  Coral Spikes, DO ?Primary GI:  Dr. Abbey Chatters ? ?Chief Complaint  ?Patient presents with  ? Follow-up  ?  Hospital follow up  ? ? ?HPI:   ?Lindsey Murillo is a 26 y.o. female who presents to clinic today for follow-up visit.  History of iron deficiency anemia, CKD, type 1 diabetes, diabetic neuropathy, chronic pancreatitis with EPI, stable pancreatic pseudocyst. ? ?Previously seen in January for iron deficiency anemia. Anemia first noted in 04/2019 with Hgb of 6.3 during hospitalization at which time she had folate deficiency, low iron and sats, heme positive stool. She had normal ferritin in setting of osteomyelitis. She also had acute kidney injury suspected to be due to vanc. She completed EGD 06/2019 and had mild gastritis, biopsies negative for celiac. ? ?Anemia has improved with most recent hemoglobin 11.4.  Following with Dr. Raliegh Ip of hematology.  Most recent iron level 42, ferritin 324. ? ?Recent hospitalization for acute on chronic renal failure, fluid overload,.  New baseline creatinine appears to be around 3.  Switch from Lasix to torsemide 60 mg twice daily for lower extremity swelling.  States this is at baseline currently. ? ?Does have history of intermittent left-sided abdominal pain, sometimes severe.  States this has improved since previously seen but does still happen on occasion. ? ?Past Medical History:  ?Diagnosis Date  ? Acanthosis nigricans, acquired   ? Asthma   ? Diabetic autonomic neuropathy ([place])   ? Diabetic peripheral neuropathy ([place])   ? Environmental allergies   ? Goiter   ? Hypoglycemia associated with diabetes ([place])   ? Tachycardia   ? Thyroiditis, autoimmune   ? Type 1 diabetes mellitus in patient age 85-19 years with HbA1C goal below 7.5   ? ? ?Past Surgical History:  ?Procedure Laterality Date  ? BIOPSY  08/27/2016  ? Procedure: BIOPSY;  Surgeon: Danie Binder, MD;  [facility];  Service: Endoscopy;;   duodenum; gastric  ? BIOPSY  06/27/2019  ? Procedure: BIOPSY;  Surgeon: Daneil Dolin, MD;  [facility];  Service: Endoscopy;;  ? COLONOSCOPY    ? ESOPHAGOGASTRODUODENOSCOPY N/A 08/27/2016  ? Dr. Oneida Alar: mild gastritis. Negative celiac. No obvious source for dyspepsia/diarrhea  ? ESOPHAGOGASTRODUODENOSCOPY (EGD) WITH PROPOFOL N/A 06/27/2019  ? rourk: Focal abnormality of the gastric mucosa likely due to trauma (heaving).  Biopsy showed mild gastritis, negative for H. pylori.  Esophageal dilation for history of dysphagia but normal-appearing esophagus.  ? ? ?Current Outpatient Medications  ?Medication Sig Dispense Refill  ? acetaminophen (TYLENOL) 325 MG tablet Take 650 mg by mouth every 6 (six) hours as needed (for pain).    ? blood glucose meter kit and supplies KIT Dispense based on patient and insurance preference. Use up to four times daily as directed. 1 each 0  ? calcium acetate, Phos Binder, (PHOSLYRA) 667 MG/5ML SOLN Take by mouth 3 (three) times daily with meals.    ? Continuous Blood Gluc Receiver (FREESTYLE LIBRE 2 READER) DEVI As directed 1 each 0  ? Continuous Blood Gluc Sensor (FREESTYLE LIBRE 2 SENSOR) MISC 1 Piece by Does not apply route every 14 (fourteen) days. 2 each 3  ? insulin glargine-yfgn (SEMGLEE) 100 UNIT/ML injection 12 Units daily.    ? Insulin Pen Needle 31G X 4 MM MISC Use as directed to administer insulin. 100 each 1  ? metoprolol tartrate (LOPRESSOR) 50 MG tablet Take 50 mg by mouth 2 (  two) times daily.    ? potassium chloride (KLOR-CON) 20 MEQ packet Take 40 mEq by mouth daily. 30 each 1  ? rosuvastatin (CRESTOR) 10 MG tablet Take 1 tablet (10 mg total) by mouth daily. 90 tablet 3  ? torsemide (DEMADEX) 20 MG tablet Take 60 mg by mouth 2 (two) times daily.    ? vitamin B-12 (CYANOCOBALAMIN) 1000 MCG tablet Take 1,000 mcg by mouth daily.    ? feeding supplement (ENSURE ENLIVE / ENSURE PLUS) LIQD Take 237 mLs by mouth 2 (two) times daily between meals. (Patient not taking:  Reported on 08/27/2021) 237 mL 12  ? ?No current facility-administered medications for this visit.  ? ? ?Allergies as of 08/27/2021  ? (No Known Allergies)  ? ? ?Family History  ?Problem Relation Age of Onset  ? Diabetes Mother   ?     Type II DM  ? Thyroid disease Mother   ? Diabetes Maternal Grandmother   ?     Type II DM  ? Diabetes Cousin   ?     Type II DM  ? Colon cancer Neg Hx   ? Colon polyps Neg Hx   ? ? ?Social History  ? ?Socioeconomic History  ? Marital status: Single  ?  Spouse name: Not on file  ? Number of children: 0  ? Years of education: Not on file  ? Highest education level: High school graduate  ?Occupational History  ? Occupation: unemployed  ?Tobacco Use  ? Smoking status: Never  ? Smokeless tobacco: Never  ?Vaping Use  ? Vaping Use: Never used  ?Substance and Sexual Activity  ? Alcohol use: No  ? Drug use: No  ? Sexual activity: Never  ?  Birth control/protection: Abstinence  ?Other Topics Concern  ? Not on file  ?Social History Narrative  ? Not on file  ? ?Social Determinants of Health  ? ?Financial Resource Strain: Not on file  ?Food Insecurity: Not on file  ?Transportation Needs: Not on file  ?Physical Activity: Not on file  ?Stress: Not on file  ?Social Connections: Not on file  ? ? ?Subjective: ?Review of Systems  ?Constitutional:  Negative for chills and fever.  ?HENT:  Negative for congestion and hearing loss.   ?Eyes:  Negative for blurred vision and double vision.  ?Respiratory:  Negative for cough and shortness of breath.   ?Cardiovascular:  Negative for chest pain and palpitations.  ?Gastrointestinal:  Negative for abdominal pain, blood in stool, constipation, diarrhea, heartburn, melena and vomiting.  ?Genitourinary:  Negative for dysuria and urgency.  ?Musculoskeletal:  Negative for joint pain and myalgias.  ?Skin:  Negative for itching and rash.  ?Neurological:  Negative for dizziness and headaches.  ?Psychiatric/Behavioral:  Negative for depression. The patient is not  nervous/anxious.   ? ? ?Objective: ?BP 140/86   Pulse 86   Temp (!) 97.3 ?F (36.3 ?C)   Ht 5' 7"  (1.702 m)   Wt 133 lb 6.4 oz (60.5 kg)   BMI 20.89 kg/m?  ?Physical Exam ?Constitutional:   ?   Appearance: Normal appearance.  ?HENT:  ?   Head: Normocephalic and atraumatic.  ?Eyes:  ?   Extraocular Movements: Extraocular movements intact.  ?   Conjunctiva/sclera: Conjunctivae normal.  ?Cardiovascular:  ?   Rate and Rhythm: Normal rate and regular rhythm.  ?Pulmonary:  ?   Effort: Pulmonary effort is normal.  ?   Breath sounds: Normal breath sounds.  ?Abdominal:  ?   General: Bowel sounds  are normal.  ?   Palpations: Abdomen is soft.  ?Musculoskeletal:     ?   General: No swelling. Normal range of motion.  ?   Cervical back: Normal range of motion and neck supple.  ?   Right lower leg: Edema present.  ?   Left lower leg: Edema present.  ?Skin: ?   General: Skin is warm and dry.  ?   Coloration: Skin is not jaundiced.  ?Neurological:  ?   General: No focal deficit present.  ?   Mental Status: She is alert and oriented to person, place, and time.  ?Psychiatric:     ?   Mood and Affect: Mood normal.     ?   Behavior: Behavior normal.  ? ? ? ?Assessment: ?*Iron deficiency anemia ?*Left side abdominal pain-improved ? ?Plan: ?We will reschedule patient for colonoscopy today to evaluate her iron deficiency anemia.  No upper GI symptoms today so I think we can hold off on EGD as she has had previous EGD performed 2 years ago. ? ?The risks including infection, bleed, or perforation as well as benefits, limitations, alternatives and imponderables have been reviewed with the patient. Questions have been answered. All parties agreeable. ? ?We will use renal friendly colon preparation. ? ? ?08/27/2021 10:28 AM ? ? ?Disclaimer: This note was dictated with voice recognition software. Similar sounding words can inadvertently be transcribed and may not be corrected upon review. ? ?

## 2021-08-28 ENCOUNTER — Ambulatory Visit (HOSPITAL_COMMUNITY): Payer: 59 | Admitting: Physical Therapy

## 2021-08-28 ENCOUNTER — Encounter (HOSPITAL_COMMUNITY): Payer: Self-pay | Admitting: Physical Therapy

## 2021-08-28 DIAGNOSIS — R2689 Other abnormalities of gait and mobility: Secondary | ICD-10-CM

## 2021-08-28 DIAGNOSIS — M6281 Muscle weakness (generalized): Secondary | ICD-10-CM

## 2021-08-28 NOTE — Therapy (Addendum)
?OUTPATIENT PHYSICAL THERAPY TREATMENT NOTE ? ?Patient Name: Lindsey Murillo ?MRN: [id number] ?DOB:08-Jun-1995, 26 y.o., female ?Today's Date: 09/14/2021 ? ?PCP: Coral Spikes, DO ?REFERRING PROVIDER: Coral Spikes, DO ? ? ? ? ? ? ? ? ? ?Past Medical History:  ?Diagnosis Date  ? Acanthosis nigricans, acquired   ? Asthma   ? Diabetic autonomic neuropathy ([place])   ? Diabetic peripheral neuropathy ([place])   ? Environmental allergies   ? Goiter   ? Hypoglycemia associated with diabetes ([place])   ? Tachycardia   ? Thyroiditis, autoimmune   ? Type 1 diabetes mellitus in patient age 58-19 years with HbA1C goal below 7.5   ? ?Past Surgical History:  ?Procedure Laterality Date  ? BIOPSY  08/27/2016  ? Procedure: BIOPSY;  Surgeon: Danie Binder, MD;  [facility];  Service: Endoscopy;;  duodenum; gastric  ? BIOPSY  06/27/2019  ? Procedure: BIOPSY;  Surgeon: Daneil Dolin, MD;  [facility];  Service: Endoscopy;;  ? COLONOSCOPY    ? ESOPHAGOGASTRODUODENOSCOPY N/A 08/27/2016  ? Dr. Oneida Alar: mild gastritis. Negative celiac. No obvious source for dyspepsia/diarrhea  ? ESOPHAGOGASTRODUODENOSCOPY (EGD) WITH PROPOFOL N/A 06/27/2019  ? rourk: Focal abnormality of the gastric mucosa likely due to trauma (heaving).  Biopsy showed mild gastritis, negative for H. pylori.  Esophageal dilation for history of dysphagia but normal-appearing esophagus.  ? ?Patient Active Problem List  ? Diagnosis Date Noted  ? Non-adherence to medical treatment 06/29/2021  ? Normocytic anemia 06/26/2021  ? Venous stasis dermatitis 06/17/2021  ? Peripheral edema 06/13/2021  ? Acute renal failure superimposed on stage 4 chronic kidney disease ([place]) 06/12/2021  ? Abdominal pain, left lower quadrant 06/09/2021  ? Iron deficiency anemia 06/02/2021  ? Nephrotic syndrome 05/27/2021  ? Chronic pancreatitis ([place]) 08/22/2019  ? Pancreatic pseudocyst/cyst 08/22/2019  ? GERD (gastroesophageal reflux disease) 08/22/2019  ? 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 Vitamin D deficiency 03/20/2019  ? Uncontrolled type 1 diabetes mellitus with hyperglycemia ([place]) 05/12/2015  ? Essential hypertension, benign 11/30/2012  ? Diabetic peripheral neuropathy ([place])   ? Asthma   ? ? ?REFERRING DIAG: Gait Difficulty ? ?THERAPY DIAG:  ?Muscle weakness (generalized) ? ?Other abnormalities of gait and mobility ? ?PERTINENT HISTORY: 1 diabetes, autoimmune thyroiditis, diabetic neuropathy, nephrotic syndrome, hypertension, asthma, and more ? ?PRECAUTIONS: none ? ?SUBJECTIVE:          Pt states that it is easier for her to get up from a chair these days; she is doing her exercises once a day.  She has compression garments for her feet but she is going to order new ones ?                                  As she does not feel that they fit right. Pt is frustrated about the swelling in her legs  ?PAIN:  ?Are you having pain? 0/10  ? ? ? ?TODAY'S TREATMENT:  ?08/28/2021:   ? ? ?LE LANDMARK RIGHT ? Left     ?At groin       ?30 cm proximal to suprapatella       ?20 cm proximal to suprapatella       ?10 cm proximal to suprapatella       ?At [place] / popliteal crease       ?30 cm proximal to floor at lateral plantar foot  42.8  43.6    ?  20 cm proximal to floor at lateral plantar foot  36.3  34.8        ?10 cm proximal to floor at lateral plantar foot  30.7  31.7        ?Circumference of ankle/heel 38.2  37.9  ?       ?5 cm proximal to 1st MTP joint 25.2  25        ?Across MTP joint 25.8  24.8       ?  ?Around proximal great toe  9.8 9.6 ?    ?(Blank rows = not tested) ? ? ? ? ? 08/28/21 0001  ?Manual Therapy  ?Manual Therapy Manual Lymphatic Drainage (MLD);Other (comment);Compression Bandaging  ?Manual therapy comments Manual complete separate than rest of tx  ?Manual Lymphatic Drainage (MLD) Not done today due to time issues   ?Compression Bandaging 1/2" foam  cut; bandaged with foam and profore lite to distal  B LE   ? ? ? ? ? ? ?08/26/21: ?Marching alternating 10x each ?Heel raise  20x ?Isometric abd against wall 2x 10 5" holds (Cueing to reduce side bend) ?STS no HHA, elevated height 25in 1st set WBOS, 2nd set NBOS ?Lunge onto 6" step x 10  ?Forearm supported at counter hip extension x 10 B ?Tandem stance 3x 30 ? ?08/18/21:  ?  2MWT 522 ft   ?  MMT ?  5 STS required 2 thumb assistance 31.77" ? Standing: squat with no HHA 10x3" ? Supine: Bridge ? Sidelying: AAROM abd ? Educated benefits with compression garments, measured and given handout for [employer] purchase knee high ?  ?   ? ?08/14/21: ?Supine: bridge 15x 5" ? 90/90 bent knee raise ?Quadruped UE with ab set 10x5" ? LE partial range 10x 5" ?Prone: SLR 10x 5" ?Sidelying: AAROM abd 10x 5" ?10STS elevated height with no/minimal 1 UE support ?Standing: ? Forward lunge ? Side lunge on 6in step ? Squats 10x5" ? ? ?08/11/21: ?  Standing: ?  225ft gait ?RTB paloff sidestep walk out 5RT each direction ?Marching alternating 10x each ?Heel raise 20x ?Toe raises incline slope ?SLS 3" max of 5 without HHA ?Hamstring curl 15x 3" ?Squat 10x 3" with HHA  ?Sidestep 3RT no HHA ?Lunge onto 6" step x 10  ?Sidelying: ?Hip abd partial range 10x 3" ? ?08/03/21 ?Nustep seat 9 lv 2 , arms 8, 6 min for strength and endurance ?Standing: ?             Forearm supported at counter hip extension x 10 B ?             Hip abduction isometric against cabinet x 10 B 3" hold ?             Heel raises x 20 ?             Toe raises at // x 20 ?             Squat x 2 x 10 ?             Slant board stretch 5 x 20" ?             Marching x 10  each leg ?             Side stepping in // x 3 passes ?             Lunge onto 6" step x 10  ?  SLS x 3 x 10" each one hand hold  ?             Step up 6" B x 10  ?  Lateral step ups 6 inch x 10 each ?             sit to stand 2  x 5 with emphasis on shifting weight forward to stand ? ? ?07/31/2021 ?          Standing: ?             Forearm supported at counter hip extension x 10 B ?             Hip abduction isometric against  cabinet x 10 B ?             Heel raises x 15 ?             Toe raises at // on slant x 15 ?             Squat x 15 ?             Slant board stretch 3 x 30  ?             Marching x 10   ?             Side stepping in // x 3  ?             Lunge onto 6" step x 10  ?             SLS x 3 one hand hold  ?             Step up 6" B x 10  ?   ? ?         ?             sit to stand x 5 ?             ? ?07/29/21 ?Nustep seat 9 lv 2 , arms 8, 6 min for strength and endurance ?Heel raise x20 ?Toe raise x20 ?Slant board gastroc stretch 5 x 20" ?Step taps 6 inch x20 ?Step ups 6 inch x 10 each alternating ?Lateral step ups 6 inch x 10 each ?Sit to stand  2 x 10 (min use of Ues) needs verbal cues for technique ?Staggered stance 2 x 15"  ?Heel toe walking in parallel bars x 4 passes, cga ?Standing on foam, diagonals with red med ball x 10 each way ? ? ? ?07/28/21 ?Nustep seat 9 lv 2 6 min for strength and endurance ?Heel raise x20 ?Toe raise x20 ?Step taps 6 inch x20 ?Standing hip abduction 2 x 10 ?Standing hip extension 2 x10 ?Step ups 6 inch x15 ?Sit to stand (elevated surface with black foam pad) 2 x 10 (min use of Ues)  ?Staggered stance 3 x 20"  ? ?07/23/21 ?Aerobic = Nustep 6 minutes, level 1, seat 8, warm up and endurance ?Therapeutic exercises =  ?Standing in parallel bars - all with cueing for core activation, tall posture with glut squeeze on WB leg ?Heel raise 1x 10    ?Toe raise 1x 10 alternating to focus on as big a motion as able ?Alternating march 2x 10 bilateral - second round focused on stance leg hold 3 seconds ?Hip abduction 2x 10 bilateral  ?Hip extension 2x 10 bilateral  ?STS 2 x 5 with UE support on chair and black balance foam  under buttock for focus on increased B LE work and eccentric control ?Seated (new) -  ?LAQ 1 x 10 each leg  ?Hip adduction with blue gym ball between knees - 2 x 10 cued for core and pelvic floor activation  ? ? ?PATIENT EDUCATION: ?Education details: Reviewed goals, objective findings.   Educated benefits with compression garments, measured and given handout for [employer] purchase knee hig  ?Person educated: Patient ?Education method: Explanation and Demonstration ?Education comprehension:

## 2021-08-28 NOTE — Addendum Note (Signed)
Addended by: Leeroy Cha on: 08/28/2021 04:35 PM ? ? Modules accepted: Orders ? ?

## 2021-09-01 ENCOUNTER — Ambulatory Visit (HOSPITAL_COMMUNITY): Payer: 59 | Admitting: Physical Therapy

## 2021-09-02 ENCOUNTER — Encounter (HOSPITAL_COMMUNITY): Payer: Self-pay

## 2021-09-02 ENCOUNTER — Ambulatory Visit (HOSPITAL_COMMUNITY): Payer: 59

## 2021-09-02 DIAGNOSIS — R2689 Other abnormalities of gait and mobility: Secondary | ICD-10-CM

## 2021-09-02 DIAGNOSIS — M6281 Muscle weakness (generalized): Secondary | ICD-10-CM

## 2021-09-02 DIAGNOSIS — R600 Localized edema: Secondary | ICD-10-CM

## 2021-09-02 NOTE — Progress Notes (Signed)
?   09/02/21 0001  ?Knee/Hip Exercises: Seated  ?Other Seated Knee/Hip Exercises ankle dorsi/plantarflexion x10  ?Long [employer] 2 sets;10 reps  ?Sit to Sand 15 reps;without UE support ?(elevated height)  ?Knee/Hip Exercises: Supine  ?Bridges 10 reps  ?Other Supine Knee/Hip Exercises bent knee raise 25x 5"  ? ? ?

## 2021-09-02 NOTE — Therapy (Addendum)
?OUTPATIENT PHYSICAL THERAPY TREATMENT NOTE ? ?Patient Name: Lindsey Murillo ?MRN: [id number] ?DOB:09-21-1995, 26 y.o., female ?Today's Date: 09/02/2021 ? ?PCP: Coral Spikes, DO ?REFERRING PROVIDER: Coral Spikes, DO ? ? PT End of Session - 09/02/21 1549   ? ? Visit Number 13   ? Number of Visits 18   ? Date for PT Re-Evaluation 09/30/21   ? Authorization Type [employer] needs auth after 20 visists   ? Authorization - Visit Number 13   ? Authorization - Number of Visits 30   ? PT Start Time 8101   ? PT Stop Time 1533   ? PT Time Calculation (min) 46 min   ? Activity Tolerance Patient tolerated treatment well   ? Behavior During Therapy [facility] for tasks assessed/performed   ? ?  ?  ? ?  ? ? ? ? ? ? ? ? ?Past Medical History:  ?Diagnosis Date  ? Acanthosis nigricans, acquired   ? Asthma   ? Diabetic autonomic neuropathy ([place])   ? Diabetic peripheral neuropathy ([place])   ? Environmental allergies   ? Goiter   ? Hypoglycemia associated with diabetes ([place])   ? Tachycardia   ? Thyroiditis, autoimmune   ? Type 1 diabetes mellitus in patient age 27-19 years with HbA1C goal below 7.5   ? ?Past Surgical History:  ?Procedure Laterality Date  ? BIOPSY  08/27/2016  ? Procedure: BIOPSY;  Surgeon: Danie Binder, MD;  [facility];  Service: Endoscopy;;  duodenum; gastric  ? BIOPSY  06/27/2019  ? Procedure: BIOPSY;  Surgeon: Daneil Dolin, MD;  [facility];  Service: Endoscopy;;  ? COLONOSCOPY    ? ESOPHAGOGASTRODUODENOSCOPY N/A 08/27/2016  ? Dr. Oneida Alar: mild gastritis. Negative celiac. No obvious source for dyspepsia/diarrhea  ? ESOPHAGOGASTRODUODENOSCOPY (EGD) WITH PROPOFOL N/A 06/27/2019  ? rourk: Focal abnormality of the gastric mucosa likely due to trauma (heaving).  Biopsy showed mild gastritis, negative for H. pylori.  Esophageal dilation for history of dysphagia but normal-appearing esophagus.  ? ?Patient Active Problem List  ? Diagnosis Date Noted  ? Non-adherence to medical treatment 06/29/2021  ?  Normocytic anemia 06/26/2021  ? Venous stasis dermatitis 06/17/2021  ? Peripheral edema 06/13/2021  ? Acute renal failure superimposed on stage 4 chronic kidney disease ([place]) 06/12/2021  ? Abdominal pain, left lower quadrant 06/09/2021  ? Iron deficiency anemia 06/02/2021  ? Nephrotic syndrome 05/27/2021  ? Chronic pancreatitis ([place]) 08/22/2019  ? Pancreatic pseudocyst/cyst 08/22/2019  ? GERD (gastroesophageal reflux disease) 08/22/2019  ? 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 Vitamin D deficiency 03/20/2019  ? Uncontrolled type 1 diabetes mellitus with hyperglycemia ([place]) 05/12/2015  ? Essential hypertension, benign 11/30/2012  ? Diabetic peripheral neuropathy ([place])   ? Asthma   ? ? ?REFERRING DIAG: Gait Difficulty ? ?THERAPY DIAG:  ?Other abnormalities of gait and mobility ? ?Localized edema ? ?Muscle weakness (generalized) ? ?PERTINENT HISTORY: 1 diabetes, autoimmune thyroiditis, diabetic neuropathy, nephrotic syndrome, hypertension, asthma, and more ? ?PRECAUTIONS: none ? ?SUBJECTIVE:       Pt stated she removed bandages on Monday, forgot the foam in brother's car.  Reports legs felt heavy with bandages on. ?PAIN:  ?Are you having pain? 0/10  ? ? ? ?TODAY'S TREATMENT:  ?09/02/21: ? 09/02/21 0001  ?Knee/Hip Exercises: Seated  ?Other Seated Knee/Hip Exercises ankle dorsi/plantarflexion x10  ?Long [employer] 2 sets;10 reps  ?Sit to Sand 15 reps;without UE support ?(elevated height)  ?Knee/Hip Exercises: Supine  ?Bridges 10 reps  ?Other  Supine Knee/Hip Exercises bent knee raise 25x 5"  ?Manual Therapy  ?Manual Therapy Manual Lymphatic Drainage (MLD);Compression Bandaging  ?Manual therapy comments Manual complete separate than rest of tx  ?Manual Lymphatic Drainage (MLD) Manual lymph decongestive techniques including short neck, superficial and deep abdomnial, inginueal to axillary anastomoses  ?Compression Bandaging Profore, pt forgot foam to bring next session.  ? ?08/28/2021:   ? ? ?LE LANDMARK RIGHT ? Left      ?At groin       ?30 cm proximal to suprapatella       ?20 cm proximal to suprapatella       ?10 cm proximal to suprapatella       ?At [place] / popliteal crease       ?30 cm proximal to floor at lateral plantar foot  42.8  43.6    ?20 cm proximal to floor at lateral plantar foot  36.3  34.8        ?10 cm proximal to floor at lateral plantar foot  30.7  31.7        ?Circumference of ankle/heel 38.2  37.9  ?       ?5 cm proximal to 1st MTP joint 25.2  25        ?Across MTP joint 25.8  24.8       ?  ?Around proximal great toe  9.8 9.6 ?    ?(Blank rows = not tested) ? ? ? ? ? 08/28/21 0001  ?Manual Therapy  ?Manual Therapy Manual Lymphatic Drainage (MLD);Other (comment);Compression Bandaging  ?Manual therapy comments Manual complete separate than rest of tx  ?Manual Lymphatic Drainage (MLD) Not done today due to time issues   ?Compression Bandaging 1/2" foam  cut; bandaged with foam and profore lite to distal  B LE   ? ? ? ? ? ? ?08/26/21: ?Marching alternating 10x each ?Heel raise 20x ?Isometric abd against wall 2x 10 5" holds (Cueing to reduce side bend) ?STS no HHA, elevated height 25in 1st set WBOS, 2nd set NBOS ?Lunge onto 6" step x 10  ?Forearm supported at counter hip extension x 10 B ?Tandem stance 3x 30 ? ?08/18/21:  ?  2MWT 522 ft   ?  MMT ?  5 STS required 2 thumb assistance 31.77" ? Standing: squat with no HHA 10x3" ? Supine: Bridge ? Sidelying: AAROM abd ? Educated benefits with compression garments, measured and given handout for [employer] purchase knee high ?  ?   ? ?08/14/21: ?Supine: bridge 15x 5" ? 90/90 bent knee raise ?Quadruped UE with ab set 10x5" ? LE partial range 10x 5" ?Prone: SLR 10x 5" ?Sidelying: AAROM abd 10x 5" ?10STS elevated height with no/minimal 1 UE support ?Standing: ? Forward lunge ? Side lunge on 6in step ? Squats 10x5" ? ? ?08/11/21: ?  Standing: ?  258ft gait ?RTB paloff sidestep walk out 5RT each direction ?Marching alternating 10x each ?Heel raise 20x ?Toe raises incline  slope ?SLS 3" max of 5 without HHA ?Hamstring curl 15x 3" ?Squat 10x 3" with HHA  ?Sidestep 3RT no HHA ?Lunge onto 6" step x 10  ?Sidelying: ?Hip abd partial range 10x 3" ? ?08/03/21 ?Nustep seat 9 lv 2 , arms 8, 6 min for strength and endurance ?Standing: ?             Forearm supported at counter hip extension x 10 B ?             Hip abduction isometric against cabinet  x 10 B 3" hold ?             Heel raises x 20 ?             Toe raises at // x 20 ?             Squat x 2 x 10 ?             Slant board stretch 5 x 20" ?             Marching x 10  each leg ?             Side stepping in // x 3 passes ?             Lunge onto 6" step x 10  ?             SLS x 3 x 10" each one hand hold  ?             Step up 6" B x 10  ?  Lateral step ups 6 inch x 10 each ?             sit to stand 2  x 5 with emphasis on shifting weight forward to stand ? ? ?07/31/2021 ?          Standing: ?             Forearm supported at counter hip extension x 10 B ?             Hip abduction isometric against cabinet x 10 B ?             Heel raises x 15 ?             Toe raises at // on slant x 15 ?             Squat x 15 ?             Slant board stretch 3 x 30  ?             Marching x 10   ?             Side stepping in // x 3  ?             Lunge onto 6" step x 10  ?             SLS x 3 one hand hold  ?             Step up 6" B x 10  ?   ? ?         ?             sit to stand x 5 ?             ? ?07/29/21 ?Nustep seat 9 lv 2 , arms 8, 6 min for strength and endurance ?Heel raise x20 ?Toe raise x20 ?Slant board gastroc stretch 5 x 20" ?Step taps 6 inch x20 ?Step ups 6 inch x 10 each alternating ?Lateral step ups 6 inch x 10 each ?Sit to stand  2 x 10 (min use of [employer]) needs verbal cues for technique ?Staggered stance 2 x 15"  ?Heel toe walking in parallel bars x 4 passes, cga ?Standing on foam, diagonals with red med ball x 10 each way ? ? ? ?07/28/21 ?Nustep seat 9 lv 2 6 min for strength and endurance ?  Heel raise x20 ?Toe raise x20 ?Step  taps 6 inch x20 ?Standing hip abduction 2 x 10 ?Standing hip extension 2 x10 ?Step ups 6 inch x15 ?Sit to stand (elevated surface with black foam pad) 2 x 10 (min use of [employer]es)  ?Staggered stance 3 x 20"  ?

## 2021-09-03 ENCOUNTER — Encounter (HOSPITAL_COMMUNITY): Payer: Self-pay

## 2021-09-03 ENCOUNTER — Ambulatory Visit (HOSPITAL_COMMUNITY): Payer: 59

## 2021-09-03 DIAGNOSIS — R600 Localized edema: Secondary | ICD-10-CM

## 2021-09-03 DIAGNOSIS — M6281 Muscle weakness (generalized): Secondary | ICD-10-CM

## 2021-09-03 DIAGNOSIS — R2689 Other abnormalities of gait and mobility: Secondary | ICD-10-CM

## 2021-09-03 NOTE — Therapy (Addendum)
?OUTPATIENT PHYSICAL THERAPY TREATMENT NOTE ? ?Patient Name: Lindsey Murillo ?MRN: [id number] ?DOB:03-05-1996, 26 y.o., female ?Today's Date: 09/03/2021 ? ?PCP: Coral Spikes, DO ?REFERRING PROVIDER: Coral Spikes, DO ? ? PT End of Session - 09/03/21 1504   ? ? Visit Number 14   ? Number of Visits 18   ? Date for PT Re-Evaluation 09/30/21   ? Authorization Type [employer] needs auth after 20 visists   ? Authorization - Visit Number 14   ? Authorization - Number of Visits 30   ? PT Start Time 1315   ? PT Stop Time 7793   ? PT Time Calculation (min) 42 min   ? Activity Tolerance Patient tolerated treatment well   ? Behavior During Therapy [facility] for tasks assessed/performed   ? ?  ?  ? ?  ? ? ? ? ? ? ? ? ?Past Medical History:  ?Diagnosis Date  ? Acanthosis nigricans, acquired   ? Asthma   ? Diabetic autonomic neuropathy ([place])   ? Diabetic peripheral neuropathy ([place])   ? Environmental allergies   ? Goiter   ? Hypoglycemia associated with diabetes ([name])   ? Tachycardia   ? Thyroiditis, autoimmune   ? Type 1 diabetes mellitus in patient age 19-19 years with HbA1C goal below 7.5   ? ?Past Surgical History:  ?Procedure Laterality Date  ? BIOPSY  08/27/2016  ? Procedure: BIOPSY;  Surgeon: Danie Binder, MD;  [facility];  Service: Endoscopy;;  duodenum; gastric  ? BIOPSY  06/27/2019  ? Procedure: BIOPSY;  Surgeon: Daneil Dolin, MD;  [facility];  Service: Endoscopy;;  ? COLONOSCOPY    ? ESOPHAGOGASTRODUODENOSCOPY N/A 08/27/2016  ? Dr. Oneida Alar: mild gastritis. Negative celiac. No obvious source for dyspepsia/diarrhea  ? ESOPHAGOGASTRODUODENOSCOPY (EGD) WITH PROPOFOL N/A 06/27/2019  ? rourk: Focal abnormality of the gastric mucosa likely due to trauma (heaving).  Biopsy showed mild gastritis, negative for H. pylori.  Esophageal dilation for history of dysphagia but normal-appearing esophagus.  ? ?Patient Active Problem List  ? Diagnosis Date Noted  ? Non-adherence to medical treatment 06/29/2021  ?  Normocytic anemia 06/26/2021  ? Venous stasis dermatitis 06/17/2021  ? Peripheral edema 06/13/2021  ? Acute renal failure superimposed on stage 4 chronic kidney disease ([place]) 06/12/2021  ? Abdominal pain, left lower quadrant 06/09/2021  ? Iron deficiency anemia 06/02/2021  ? Nephrotic syndrome 05/27/2021  ? Chronic pancreatitis ([place]) 08/22/2019  ? Pancreatic pseudocyst/cyst 08/22/2019  ? GERD (gastroesophageal reflux disease) 08/22/2019  ? 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 Vitamin D deficiency 03/20/2019  ? Uncontrolled type 1 diabetes mellitus with hyperglycemia ([place]) 05/12/2015  ? Essential hypertension, benign 11/30/2012  ? Diabetic peripheral neuropathy ([place])   ? Asthma   ? ? ?REFERRING DIAG: Gait Difficulty ? ?THERAPY DIAG:  ?Other abnormalities of gait and mobility ? ?Localized edema ? ?Muscle weakness (generalized) ? ?PERTINENT HISTORY: 1 diabetes, autoimmune thyroiditis, diabetic neuropathy, nephrotic syndrome, hypertension, asthma, and more ? ?PRECAUTIONS: none ? ?SUBJECTIVE:       Pt stated she couldn't find the foam, mom misplaced, plans to look later today.  Dressings slid down Rt LE some, removed earlier today. ?PAIN:  ?Are you having pain? 0/10  ? ? ? ?TODAY'S TREATMENT:  ?09/03/21: ? ? 09/03/21 0001  ?Knee/Hip Exercises: Seated  ?Long [employer] 2 sets;10 reps  ?Sit to Sand 10 reps;without UE support  ?Marching 20 reps  ?Manual Therapy  ?Manual Therapy Manual Lymphatic Drainage (MLD);Compression Bandaging;Other (comment)  ?  Manual therapy comments Manual complete separate than rest of tx  ?Manual Lymphatic Drainage (MLD) Manual lymph decongestive techniques including short neck, superficial and deep abdomnial, inginueal to axillary anastomoses  ?Compression Bandaging Profore, pt forgot foam to bring next session.  ?Other Manual Therapy Measurement  ? ?09/02/21: ? 09/02/21 0001  ?Knee/Hip Exercises: Seated  ?Other Seated Knee/Hip Exercises ankle dorsi/plantarflexion x10  ?Long [employer] 2  sets;10 reps  ?Sit to Sand 15 reps;without UE support ?(elevated height)  ?Knee/Hip Exercises: Supine  ?Bridges 10 reps  ?Other Supine Knee/Hip Exercises bent knee raise 25x 5"  ?Manual Therapy  ?Manual Therapy Manual Lymphatic Drainage (MLD);Compression Bandaging  ?Manual therapy comments Manual complete separate than rest of tx  ?Manual Lymphatic Drainage (MLD) Manual lymph decongestive techniques including short neck, superficial and deep abdomnial, inginueal to axillary anastomoses  ?Compression Bandaging Profore, pt forgot foam to bring next session.  ? ?08/28/2021:   ? ? ?LE LANDMARK RIGHT ?4/21 Left  ?4/21 Right  ?09/03/21 Left  ?09/03/21  ?At groin       ?30 cm proximal to suprapatella       ?20 cm proximal to suprapatella       ?10 cm proximal to suprapatella       ?At [place] / popliteal crease       ?30 cm proximal to floor at lateral plantar foot  42.8  43.6 32.3 38  ?20 cm proximal to floor at lateral plantar foot  36.3  34.8 32.9     33.4  ?10 cm proximal to floor at lateral plantar foot  30.7  31.7 29     30   ?Circumference of ankle/heel 38.2  37.9 33.8     34  ?5 cm proximal to 1st MTP joint 25.2  25 25      23.8  ?Across MTP joint 25.8  24.8 24.2     23  ?Around proximal great toe  9.8 9.6 ? 9 9  ?(Blank rows = not tested) ? ? ? ? ? 08/28/21 0001  ?Manual Therapy  ?Manual Therapy Manual Lymphatic Drainage (MLD);Other (comment);Compression Bandaging  ?Manual therapy comments Manual complete separate than rest of tx  ?Manual Lymphatic Drainage (MLD) Not done today due to time issues   ?Compression Bandaging 1/2" foam  cut; bandaged with foam and profore lite to distal  B LE   ? ? ? ? ? ? ?08/26/21: ?Marching alternating 10x each ?Heel raise 20x ?Isometric abd against wall 2x 10 5" holds (Cueing to reduce side bend) ?STS no HHA, elevated height 25in 1st set WBOS, 2nd set NBOS ?Lunge onto 6" step x 10  ?Forearm supported at counter hip extension x 10 B ?Tandem stance 3x 30 ? ?08/18/21:  ?  2MWT 522 ft    ?  MMT ?  5 STS required 2 thumb assistance 31.77" ? Standing: squat with no HHA 10x3" ? Supine: Bridge ? Sidelying: AAROM abd ? Educated benefits with compression garments, measured and given handout for [employer] purchase knee high ?  ?   ? ?08/14/21: ?Supine: bridge 15x 5" ? 90/90 bent knee raise ?Quadruped UE with ab set 10x5" ? LE partial range 10x 5" ?Prone: SLR 10x 5" ?Sidelying: AAROM abd 10x 5" ?10STS elevated height with no/minimal 1 UE support ?Standing: ? Forward lunge ? Side lunge on 6in step ? Squats 10x5" ? ? ?08/11/21: ?  Standing: ?  27ft gait ?RTB paloff sidestep walk out 5RT each direction ?Marching alternating 10x each ?Heel raise 20x ?Toe raises  incline slope ?SLS 3" max of 5 without HHA ?Hamstring curl 15x 3" ?Squat 10x 3" with HHA  ?Sidestep 3RT no HHA ?Lunge onto 6" step x 10  ?Sidelying: ?Hip abd partial range 10x 3" ? ?08/03/21 ?Nustep seat 9 lv 2 , arms 8, 6 min for strength and endurance ?Standing: ?             Forearm supported at counter hip extension x 10 B ?             Hip abduction isometric against cabinet x 10 B 3" hold ?             Heel raises x 20 ?             Toe raises at // x 20 ?             Squat x 2 x 10 ?             Slant board stretch 5 x 20" ?             Marching x 10  each leg ?             Side stepping in // x 3 passes ?             Lunge onto 6" step x 10  ?             SLS x 3 x 10" each one hand hold  ?             Step up 6" B x 10  ?  Lateral step ups 6 inch x 10 each ?             sit to stand 2  x 5 with emphasis on shifting weight forward to stand ? ? ?07/31/2021 ?          Standing: ?             Forearm supported at counter hip extension x 10 B ?             Hip abduction isometric against cabinet x 10 B ?             Heel raises x 15 ?             Toe raises at // on slant x 15 ?             Squat x 15 ?             Slant board stretch 3 x 30  ?             Marching x 10   ?             Side stepping in // x 3  ?             Lunge onto 6" step x 10  ?              SLS x 3 one hand hold  ?             Step up 6" B x 10  ?   ? ?         ?             sit to stand x 5 ?             ? ?07/29/21 ?Nustep seat 9 lv 2 , arms 8, 6 min for strength  and endurance ?Heel r

## 2021-09-07 ENCOUNTER — Ambulatory Visit: Payer: 59

## 2021-09-09 ENCOUNTER — Ambulatory Visit (HOSPITAL_COMMUNITY): Payer: 59 | Attending: Family Medicine | Admitting: Physical Therapy

## 2021-09-09 DIAGNOSIS — R2689 Other abnormalities of gait and mobility: Secondary | ICD-10-CM | POA: Insufficient documentation

## 2021-09-09 DIAGNOSIS — R600 Localized edema: Secondary | ICD-10-CM | POA: Diagnosis present

## 2021-09-09 DIAGNOSIS — M6281 Muscle weakness (generalized): Secondary | ICD-10-CM | POA: Diagnosis present

## 2021-09-09 NOTE — Therapy (Addendum)
?OUTPATIENT PHYSICAL THERAPY TREATMENT NOTE ? ?Patient Name: Lindsey Murillo ?MRN: [id number] ?DOB:August 06, 1995, 26 y.o., female ?Today's Date: 09/09/2021 ? ?PCP: Coral Spikes, DO ?REFERRING PROVIDER: Coral Spikes, DO ? ? PT End of Session - 09/09/21 1314   ? ? Visit Number 15   ? Number of Visits 18   ? Date for PT Re-Evaluation 09/30/21   ? Authorization Type Friday [employer] needs auth after 20 visists   ? Authorization - Visit Number 15   ? Authorization - Number of Visits 30   ? Progress Note Due on Visit 18   ? PT Start Time 1315   ? PT Stop Time 1430   ? PT Time Calculation (min) 75 min   ? Activity Tolerance Patient tolerated treatment well   ? Behavior During Therapy [facility] for tasks assessed/performed   ? ?  ?  ? ?  ? ? ? ? ? ? ? ? ?Past Medical History:  ?Diagnosis Date  ? Acanthosis nigricans, acquired   ? Asthma   ? Diabetic autonomic neuropathy ([place])   ? Diabetic peripheral neuropathy ([place])   ? Environmental allergies   ? Goiter   ? Hypoglycemia associated with diabetes ([place])   ? Tachycardia   ? Thyroiditis, autoimmune   ? Type 1 diabetes mellitus in patient age 27-19 years with HbA1C goal below 7.5   ? ?Past Surgical History:  ?Procedure Laterality Date  ? BIOPSY  08/27/2016  ? Procedure: BIOPSY;  Surgeon: Danie Binder, MD;  [facility];  Service: Endoscopy;;  duodenum; gastric  ? BIOPSY  06/27/2019  ? Procedure: BIOPSY;  Surgeon: Daneil Dolin, MD;  Location: [facility];  Service: Endoscopy;;  ? COLONOSCOPY    ? ESOPHAGOGASTRODUODENOSCOPY N/A 08/27/2016  ? Dr. Oneida Alar: mild gastritis. Negative celiac. No obvious source for dyspepsia/diarrhea  ? ESOPHAGOGASTRODUODENOSCOPY (EGD) WITH PROPOFOL N/A 06/27/2019  ? rourk: Focal abnormality of the gastric mucosa likely due to trauma (heaving).  Biopsy showed mild gastritis, negative for H. pylori.  Esophageal dilation for history of dysphagia but normal-appearing esophagus.  ? ?Patient Active Problem List  ? Diagnosis Date Noted  ? Non-adherence to  medical treatment 06/29/2021  ? Normocytic anemia 06/26/2021  ? Venous stasis dermatitis 06/17/2021  ? Peripheral edema 06/13/2021  ? Acute renal failure superimposed on stage 4 chronic kidney disease ([place]) 06/12/2021  ? Abdominal pain, left lower quadrant 06/09/2021  ? Iron deficiency anemia 06/02/2021  ? Nephrotic syndrome 05/27/2021  ? Chronic pancreatitis ([place]) 08/22/2019  ? Pancreatic pseudocyst/cyst 08/22/2019  ? GERD (gastroesophageal reflux disease) 08/22/2019  ? 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 Vitamin D deficiency 03/20/2019  ? Uncontrolled type 1 diabetes mellitus with hyperglycemia ([place]) 05/12/2015  ? Essential hypertension, benign 11/30/2012  ? Diabetic peripheral neuropathy ([place])   ? Asthma   ? ? ?REFERRING DIAG: Gait Difficulty ? ?THERAPY DIAG:  ?Other abnormalities of gait and mobility ? ?Localized edema ? ?Muscle weakness (generalized) ? ?PERTINENT HISTORY: 1 diabetes, autoimmune thyroiditis, diabetic neuropathy, nephrotic syndrome, hypertension, asthma, and more ? ?PRECAUTIONS: none ? ?SUBJECTIVE:       Pt states she found the foam but has not been home to get it.  Removed the bandaging on Monday due to getting wet.  Marland Kitchen ?PAIN:  ?Are you having pain? 0/10  ? ? ? ?TODAY'S TREATMENT:  ? 09/09/21 0001  ?Knee/Hip Exercises:   ?All four  Single leg raise x 10 B; hip abduction x 10   ?Sit to Sand 10 reps;without UE  support  ?   ?Manual Therapy  ?Manual Therapy Manual Lymphatic Drainage (MLD);Compression Bandaging;Other (comment)  ?Manual therapy comments Manual complete separate than rest of tx  ?Manual Lymphatic Drainage (MLD) Manual lymph decongestive techniques including short neck, superficial and deep abdomnial, inginueal to axillary anastomoses completed anterior and posteriorly   ?Compression Bandaging Profore, pt forgot foam  ?   ? ? ?09/03/21: ? ? 09/03/21 0001  ?Knee/Hip Exercises: Seated  ?Long [employer] 2 sets;10 reps  ?Sit to Sand 10 reps;without UE support  ?Marching 20 reps   ?Manual Therapy  ?Manual Therapy Manual Lymphatic Drainage (MLD);Compression Bandaging;Other (comment)  ?Manual therapy comments Manual complete separate than rest of tx  ?Manual Lymphatic Drainage (MLD) Manual lymph decongestive techniques including short neck, superficial and deep abdomnial, inginueal to axillary anastomoses  ?Compression Bandaging Profore, pt forgot foam to bring next session.  ?Other Manual Therapy Measurement  ? ?09/02/21: ? 09/02/21 0001  ?Knee/Hip Exercises: Seated  ?Other Seated Knee/Hip Exercises ankle dorsi/plantarflexion x10  ?Long [employer] 2 sets;10 reps  ?Sit to Sand 15 reps;without UE support ?(elevated height)  ?Knee/Hip Exercises: Supine  ?Bridges 10 reps  ?Other Supine Knee/Hip Exercises bent knee raise 25x 5"  ?Manual Therapy  ?Manual Therapy Manual Lymphatic Drainage (MLD);Compression Bandaging  ?Manual therapy comments Manual complete separate than rest of tx  ?Manual Lymphatic Drainage (MLD) Manual lymph decongestive techniques including short neck, superficial and deep abdomnial, inginueal to axillary anastomoses  ?Compression Bandaging Profore, pt forgot foam to bring next session.  ? ?08/28/2021:   ? ? ?LE LANDMARK RIGHT ?4/21 Left  ?4/21 Right  ?09/03/21 Left  ?09/03/21  ?At groin       ?30 cm proximal to suprapatella       ?20 cm proximal to suprapatella       ?10 cm proximal to suprapatella       ?At [place] / popliteal crease       ?30 cm proximal to floor at lateral plantar foot  42.8  43.6 32.3 38  ?20 cm proximal to floor at lateral plantar foot  36.3  34.8 32.9     33.4  ?10 cm proximal to floor at lateral plantar foot  30.7  31.7 29     30   ?Circumference of ankle/heel 38.2  37.9 33.8     34  ?5 cm proximal to 1st MTP joint 25.2  25 25      23.8  ?Across MTP joint 25.8  24.8 24.2     23  ?Around proximal great toe  9.8 9.6 ? 9 9  ?(Blank rows = not tested) ? ? ? ? ? 08/28/21 0001  ?Manual Therapy  ?Manual Therapy Manual Lymphatic Drainage (MLD);Other  (comment);Compression Bandaging  ?Manual therapy comments Manual complete separate than rest of tx  ?Manual Lymphatic Drainage (MLD) Not done today due to time issues   ?Compression Bandaging 1/2" foam  cut; bandaged with foam and profore lite to distal  B LE   ? ? ? ? ? ? ?08/26/21: ?Marching alternating 10x each ?Heel raise 20x ?Isometric abd against wall 2x 10 5" holds (Cueing to reduce side bend) ?STS no HHA, elevated height 25in 1st set WBOS, 2nd set NBOS ?Lunge onto 6" step x 10  ?Forearm supported at counter hip extension x 10 B ?Tandem stance 3x 30 ? ?08/18/21:  ?  2MWT 522 ft   ?  MMT ?  5 STS required 2 thumb assistance 31.77" ? Standing: squat with no HHA 10x3" ?  Supine: Bridge ? Sidelying: AAROM abd ? Educated benefits with compression garments, measured and given handout for [employer] purchase knee high ?  ?   ? ?08/14/21: ?Supine: bridge 15x 5" ? 90/90 bent knee raise ?Quadruped UE with ab set 10x5" ? LE partial range 10x 5" ?Prone: SLR 10x 5" ?Sidelying: AAROM abd 10x 5" ?10STS elevated height with no/minimal 1 UE support ?Standing: ? Forward lunge ? Side lunge on 6in step ? Squats 10x5" ? ? ?08/11/21: ?  Standing: ?  247ft gait ?RTB paloff sidestep walk out 5RT each direction ?Marching alternating 10x each ?Heel raise 20x ?Toe raises incline slope ?SLS 3" max of 5 without HHA ?Hamstring curl 15x 3" ?Squat 10x 3" with HHA  ?Sidestep 3RT no HHA ?Lunge onto 6" step x 10  ?Sidelying: ?Hip abd partial range 10x 3" ? ?08/03/21 ?Nustep seat 9 lv 2 , arms 8, 6 min for strength and endurance ?Standing: ?             Forearm supported at counter hip extension x 10 B ?             Hip abduction isometric against cabinet x 10 B 3" hold ?             Heel raises x 20 ?             Toe raises at // x 20 ?             Squat x 2 x 10 ?             Slant board stretch 5 x 20" ?             Marching x 10  each leg ?             Side stepping in // x 3 passes ?             Lunge onto 6" step x 10  ?             SLS x 3 x  10" each one hand hold  ?             Step up 6" B x 10  ?  Lateral step ups 6 inch x 10 each ?             sit to stand 2  x 5 with emphasis on shifting weight forward to stand ? ? ?07/31/2021 ?          Standing: ?

## 2021-09-10 ENCOUNTER — Encounter (HOSPITAL_COMMUNITY): Payer: 59

## 2021-09-11 ENCOUNTER — Encounter (HOSPITAL_COMMUNITY): Payer: Self-pay | Admitting: Physical Therapy

## 2021-09-11 ENCOUNTER — Ambulatory Visit (HOSPITAL_COMMUNITY): Payer: 59 | Admitting: Physical Therapy

## 2021-09-11 DIAGNOSIS — R600 Localized edema: Secondary | ICD-10-CM

## 2021-09-11 DIAGNOSIS — R2689 Other abnormalities of gait and mobility: Secondary | ICD-10-CM

## 2021-09-11 DIAGNOSIS — M6281 Muscle weakness (generalized): Secondary | ICD-10-CM

## 2021-09-11 NOTE — Therapy (Addendum)
?OUTPATIENT PHYSICAL THERAPY TREATMENT NOTE ? ?Patient Name: Lindsey Murillo ?MRN: [id number] ?DOB:02/09/1996, 26 y.o., female ?Today's Date: 09/11/2021 ? ?PCP: Coral Spikes, DO ?REFERRING PROVIDER: Coral Spikes, DO ? ? PT End of Session - 09/11/21 1325   ? ? Visit Number 16   ? Number of Visits 18   ? Date for PT Re-Evaluation 09/30/21   ? Authorization Type Friday [employer] needs auth after 20 visists   ? Authorization - Visit Number 15   ? Authorization - Number of Visits 30   ? Progress Note Due on Visit 18   ? PT Start Time 1325   ? PT Stop Time 1420   ? PT Time Calculation (min) 55 min   ? Activity Tolerance Patient tolerated treatment well   ? Behavior During Therapy [facility] for tasks assessed/performed   ? ?  ?  ? ?  ? ? ? ? ? ? ? ? ?Past Medical History:  ?Diagnosis Date  ? Acanthosis nigricans, acquired   ? Asthma   ? Diabetic autonomic neuropathy ([place])   ? Diabetic peripheral neuropathy ([place])   ? Environmental allergies   ? Goiter   ? Hypoglycemia associated with diabetes ([place])   ? Tachycardia   ? Thyroiditis, autoimmune   ? Type 1 diabetes mellitus in patient age 41-19 years with HbA1C goal below 7.5   ? ?Past Surgical History:  ?Procedure Laterality Date  ? BIOPSY  08/27/2016  ? Procedure: BIOPSY;  Surgeon: Danie Binder, MD;  [facility];  Service: Endoscopy;;  duodenum; gastric  ? BIOPSY  06/27/2019  ? Procedure: BIOPSY;  Surgeon: Daneil Dolin, MD;  [facility];  Service: Endoscopy;;  ? COLONOSCOPY    ? ESOPHAGOGASTRODUODENOSCOPY N/A 08/27/2016  ? Dr. Oneida Alar: mild gastritis. Negative celiac. No obvious source for dyspepsia/diarrhea  ? ESOPHAGOGASTRODUODENOSCOPY (EGD) WITH PROPOFOL N/A 06/27/2019  ? rourk: Focal abnormality of the gastric mucosa likely due to trauma (heaving).  Biopsy showed mild gastritis, negative for H. pylori.  Esophageal dilation for history of dysphagia but normal-appearing esophagus.  ? ?Patient Active Problem List  ? Diagnosis Date Noted  ? Non-adherence to  medical treatment 06/29/2021  ? Normocytic anemia 06/26/2021  ? Venous stasis dermatitis 06/17/2021  ? Peripheral edema 06/13/2021  ? Acute renal failure superimposed on stage 4 chronic kidney disease ([place]) 06/12/2021  ? Abdominal pain, left lower quadrant 06/09/2021  ? Iron deficiency anemia 06/02/2021  ? Nephrotic syndrome 05/27/2021  ? Chronic pancreatitis ([place]) 08/22/2019  ? Pancreatic pseudocyst/cyst 08/22/2019  ? GERD (gastroesophageal reflux disease) 08/22/2019  ? 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 Vitamin D deficiency 03/20/2019  ? Uncontrolled type 1 diabetes mellitus with hyperglycemia ([place]) 05/12/2015  ? Essential hypertension, benign 11/30/2012  ? Diabetic peripheral neuropathy ([place])   ? Asthma   ? ? ?REFERRING DIAG: Gait Difficulty ? ?THERAPY DIAG:  ?Other abnormalities of gait and mobility ? ?Localized edema ? ?Muscle weakness (generalized) ? ?PERTINENT HISTORY: 1 diabetes, autoimmune thyroiditis, diabetic neuropathy, nephrotic syndrome, hypertension, asthma, and more ? ?PRECAUTIONS: none ? ?SUBJECTIVE:      Pt states she had the foam out but forgot it due to the fact that she was running late.  PT states she needs to leave early as ?She had a MD appointment at 2:30.  Are you having pain? 0/10  ? ? ? ?TODAY'S TREATMENT:  ?Manual Therapy  ?Manual Therapy Manual Lymphatic Drainage (MLD);Compression Bandaging;Other (comment)  ?Manual therapy comments Manual complete separate than rest of tx  ?  Manual Lymphatic Drainage (MLD) Manual lymph decongestive techniques including short neck, superficial and deep abdomnial, inginueal to axillary anastomoses completed anterior and posteriorly   ?Compression Bandaging Profore, pt forgot foam  ? ? ? ? 09/09/21 0001  ?Knee/Hip Exercises:   ?All four  Single leg raise x 10 B; hip abduction x 10   ?Sit to Sand 10 reps;without UE support  ?   ?Manual Therapy  ?Manual Therapy Manual Lymphatic Drainage (MLD);Compression Bandaging;Other (comment)  ?Manual therapy  comments Manual complete separate than rest of tx  ?Manual Lymphatic Drainage (MLD) Manual lymph decongestive techniques including short neck, superficial and deep abdomnial, inginueal to axillary anastomoses completed anterior and posteriorly   ?Compression Bandaging Profore, pt forgot foam  ?   ? ? ?09/03/21: ? ? 09/03/21 0001  ?Knee/Hip Exercises: Seated  ?Long [employer] 2 sets;10 reps  ?Sit to Sand 10 reps;without UE support  ?Marching 20 reps  ?Manual Therapy  ?Manual Therapy Manual Lymphatic Drainage (MLD);Compression Bandaging;Other (comment)  ?Manual therapy comments Manual complete separate than rest of tx  ?Manual Lymphatic Drainage (MLD) Manual lymph decongestive techniques including short neck, superficial and deep abdomnial, inginueal to axillary anastomoses  ?Compression Bandaging Profore, pt forgot foam to bring next session.  ?Other Manual Therapy Measurement  ? ?09/02/21: ? 09/02/21 0001  ?Knee/Hip Exercises: Seated  ?Other Seated Knee/Hip Exercises ankle dorsi/plantarflexion x10  ?Long [employer] 2 sets;10 reps  ?Sit to Sand 15 reps;without UE support ?(elevated height)  ?Knee/Hip Exercises: Supine  ?Bridges 10 reps  ?Other Supine Knee/Hip Exercises bent knee raise 25x 5"  ?Manual Therapy  ?Manual Therapy Manual Lymphatic Drainage (MLD);Compression Bandaging  ?Manual therapy comments Manual complete separate than rest of tx  ?Manual Lymphatic Drainage (MLD) Manual lymph decongestive techniques including short neck, superficial and deep abdomnial, inginueal to axillary anastomoses  ?Compression Bandaging Profore, pt forgot foam to bring next session.  ? ?08/28/2021:   ? ? ?LE LANDMARK RIGHT ?4/21 Left  ?4/21 Right  ?09/03/21 Left  ?09/03/21 Right ?09/11/21 Left ?09/11/21  ?At groin         ?30 cm proximal to suprapatella         ?20 cm proximal to suprapatella         ?10 cm proximal to suprapatella         ?At [place] / popliteal crease         ?30 cm proximal to floor at lateral plantar foot  42.8  43.6  32.3 38 40 40.3  ?20 cm proximal to floor at lateral plantar foot  36.3  34.8 32.9     33.4 34.8 31.3  ?10 cm proximal to floor at lateral plantar foot  30.7  31.7 29     30  30.3 28.8  ?Circumference of ankle/heel 38.2  37.9 33.8     34 35.8 ? 35.5 ?  ?5 cm proximal to 1st MTP joint 25.2  25 25      23.8 24.8 24  ?Across MTP joint 25.8  24.8 24.2     23 25  23.8  ?Around proximal great toe  9.8 9.6 ? 9 9 9  8.6  ?(Blank rows = not tested) ? ? ? ? ? 08/28/21 0001  ?Manual Therapy  ?Manual Therapy Manual Lymphatic Drainage (MLD);Other (comment);Compression Bandaging  ?Manual therapy comments Manual complete separate than rest of tx  ?Manual Lymphatic Drainage (MLD) Not done today due to time issues   ?Compression Bandaging 1/2" foam  cut; bandaged with foam and profore lite to distal  B LE   ? ? ? ? ? ? ?08/26/21: ?Marching alternating 10x each ?Heel raise 20x ?Isometric abd against wall 2x 10 5" holds (Cueing to reduce side bend) ?STS no HHA, elevated height 25in 1st set WBOS, 2nd set NBOS ?Lunge onto 6" step x 10  ?Forearm supported at counter hip extension x 10 B ?Tandem stance 3x 30 ? ?08/18/21:  ?  2MWT 522 ft   ?  MMT ?  5 STS required 2 thumb assistance 31.77" ? Standing: squat with no HHA 10x3" ? Supine: Bridge ? Sidelying: AAROM abd ? Educated benefits with compression garments, measured and given handout for [employer] purchase knee high ?  ?   ? ?08/14/21: ?Supine: bridge 15x 5" ? 90/90 bent knee raise ?Quadruped UE with ab set 10x5" ? LE partial range 10x 5" ?Prone: SLR 10x 5" ?Sidelying: AAROM abd 10x 5" ?10STS elevated height with no/minimal 1 UE support ?Standing: ? Forward lunge ? Side lunge on 6in step ? Squats 10x5" ? ? ?08/11/21: ?  Standing: ?  241ft gait ?RTB paloff sidestep walk out 5RT each direction ?Marching alternating 10x each ?Heel raise 20x ?Toe raises incline slope ?SLS 3" max of 5 without HHA ?Hamstring curl 15x 3" ?Squat 10x 3" with HHA  ?Sidestep 3RT no HHA ?Lunge onto 6" step x 10   ?Sidelying: ?Hip abd partial range 10x 3" ? ?08/03/21 ?Nustep seat 9 lv 2 , arms 8, 6 min for strength and endurance ?Standing: ?             Forearm supported at counter hip extension x 10 B ?             Hip ab

## 2021-09-14 ENCOUNTER — Ambulatory Visit (HOSPITAL_COMMUNITY): Payer: 59 | Admitting: Physical Therapy

## 2021-09-14 DIAGNOSIS — R600 Localized edema: Secondary | ICD-10-CM

## 2021-09-14 DIAGNOSIS — M6281 Muscle weakness (generalized): Secondary | ICD-10-CM

## 2021-09-14 DIAGNOSIS — R2689 Other abnormalities of gait and mobility: Secondary | ICD-10-CM

## 2021-09-14 NOTE — Therapy (Signed)
?OUTPATIENT PHYSICAL THERAPY Lymphedema TREATMENT NOTE ? ?Patient Name: Lindsey Murillo ?MRN: [id number] ?DOB:1995-10-02, 26 y.o., female ?Today's Date: 09/14/2021 ? ?PCP: Coral Spikes, DO ?REFERRING PROVIDER: Coral Spikes, DO ? ? ?END OF SESSION:  ? PT End of Session - 09/14/21 1639   ? ? Visit Number 17   ? Number of Visits 18   ? Date for PT Re-Evaluation 09/30/21   ? Authorization Type Friday [employer] needs auth after 20 visists   ? Authorization - Visit Number 16   ? Authorization - Number of Visits 30   ? Progress Note Due on Visit 18   ? PT Start Time 1320   ? PT Stop Time 1440   ? PT Time Calculation (min) 80 min   ? Activity Tolerance Patient tolerated treatment well   ? Behavior During Therapy [facility] for tasks assessed/performed   ? ?  ?  ? ?  ?  ? ? ? ? ? ? ?Past Medical History:  ?Diagnosis Date  ? Acanthosis nigricans, acquired   ? Asthma   ? Diabetic autonomic neuropathy ([place])   ? Diabetic peripheral neuropathy ([place])   ? Environmental allergies   ? Goiter   ? Hypoglycemia associated with diabetes ([place])   ? Tachycardia   ? Thyroiditis, autoimmune   ? Type 1 diabetes mellitus in patient age 18-19 years with HbA1C goal below 7.5   ? ?Past Surgical History:  ?Procedure Laterality Date  ? BIOPSY  08/27/2016  ? Procedure: BIOPSY;  Surgeon: Danie Binder, MD;  [facility];  Service: Endoscopy;;  duodenum; gastric  ? BIOPSY  06/27/2019  ? Procedure: BIOPSY;  Surgeon: Daneil Dolin, MD;  [facility];  Service: Endoscopy;;  ? COLONOSCOPY    ? ESOPHAGOGASTRODUODENOSCOPY N/A 08/27/2016  ? Dr. Oneida Alar: mild gastritis. Negative celiac. No obvious source for dyspepsia/diarrhea  ? ESOPHAGOGASTRODUODENOSCOPY (EGD) WITH PROPOFOL N/A 06/27/2019  ? rourk: Focal abnormality of the gastric mucosa likely due to trauma (heaving).  Biopsy showed mild gastritis, negative for H. pylori.  Esophageal dilation for history of dysphagia but normal-appearing esophagus.  ? ?Patient Active Problem List  ? Diagnosis  Date Noted  ? Non-adherence to medical treatment 06/29/2021  ? Normocytic anemia 06/26/2021  ? Venous stasis dermatitis 06/17/2021  ? Peripheral edema 06/13/2021  ? Acute renal failure superimposed on stage 4 chronic kidney disease ([place]) 06/12/2021  ? Abdominal pain, left lower quadrant 06/09/2021  ? Iron deficiency anemia 06/02/2021  ? Nephrotic syndrome 05/27/2021  ? Chronic pancreatitis ([place]) 08/22/2019  ? Pancreatic pseudocyst/cyst 08/22/2019  ? GERD (gastroesophageal reflux disease) 08/22/2019  ? 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 Vitamin D deficiency 03/20/2019  ? Uncontrolled type 1 diabetes mellitus with hyperglycemia ([place]) 05/12/2015  ? Essential hypertension, benign 11/30/2012  ? Diabetic peripheral neuropathy ([place])   ? Asthma   ? ? ?REFERRING DIAG: Gait Difficulty ? ?THERAPY DIAG:  ?Other abnormalities of gait and mobility ? ?Muscle weakness (generalized) ? ?Localized edema ? ?PERTINENT HISTORY: 1 diabetes, autoimmune thyroiditis, diabetic neuropathy, nephrotic syndrome, hypertension, asthma, and more ? ?PRECAUTIONS: none ? ?SUBJECTIVE:      Pt forgot the foam again this session.  States she just removed the bandaging prior to today's appointment.  Pt wondering when she may get a pump to help reduce the edema in her legs. ? ?Are you having pain? No,  0/10  ? ? ? ?TODAY'S TREATMENT:  ?09/14/21 ?Manual Therapy  ?Manual Therapy Manual Lymphatic Drainage (MLD);Compression Bandaging;Other (comment)  ?Manual  therapy comments Manual complete separate than rest of tx  ?Manual Lymphatic Drainage (MLD) Manual lymph decongestive techniques including short neck, superficial and deep abdomnial, inginueal to axillary anastomoses completed anterior and posteriorly   ?Compression Bandaging Profore, pt forgot foam  ?Other Given sheet for ordering short stretch  ? ?09/11/21 ?Manual Therapy  ?Manual Therapy Manual Lymphatic Drainage (MLD);Compression Bandaging;Other (comment)  ?Manual therapy comments Manual  complete separate than rest of tx  ?Manual Lymphatic Drainage (MLD) Manual lymph decongestive techniques including short neck, superficial and deep abdomnial, inginueal to axillary anastomoses completed anterior and posteriorly   ?Compression Bandaging Profore, pt forgot foam  ? ? ? ? 09/09/21 0001  ?Knee/Hip Exercises:   ?All four  Single leg raise x 10 B; hip abduction x 10   ?Sit to Sand 10 reps;without UE support  ?   ?Manual Therapy  ?Manual Therapy Manual Lymphatic Drainage (MLD);Compression Bandaging;Other (comment)  ?Manual therapy comments Manual complete separate than rest of tx  ?Manual Lymphatic Drainage (MLD) Manual lymph decongestive techniques including short neck, superficial and deep abdomnial, inginueal to axillary anastomoses completed anterior and posteriorly   ?Compression Bandaging Profore, pt forgot foam  ?   ? ? ?09/03/21: ? ? 09/03/21 0001  ?Knee/Hip Exercises: Seated  ?Long [employer] 2 sets;10 reps  ?Sit to Sand 10 reps;without UE support  ?Marching 20 reps  ?Manual Therapy  ?Manual Therapy Manual Lymphatic Drainage (MLD);Compression Bandaging;Other (comment)  ?Manual therapy comments Manual complete separate than rest of tx  ?Manual Lymphatic Drainage (MLD) Manual lymph decongestive techniques including short neck, superficial and deep abdomnial, inginueal to axillary anastomoses  ?Compression Bandaging Profore, pt forgot foam to bring next session.  ?Other Manual Therapy Measurement  ? ? ? ?08/28/2021:   ? ? ?LE LANDMARK RIGHT ?4/21 Left  ?4/21 Right  ?09/03/21 Left  ?09/03/21 Right ?09/11/21 Left ?09/11/21  ?At groin         ?30 cm proximal to suprapatella         ?20 cm proximal to suprapatella         ?10 cm proximal to suprapatella         ?At [place] / popliteal crease         ?30 cm proximal to floor at lateral plantar foot  42.8  43.6 32.3 38 40 40.3  ?20 cm proximal to floor at lateral plantar foot  36.3  34.8 32.9     33.4 34.8 31.3  ?10 cm proximal to floor at lateral plantar foot   30.7  31.7 29     30  30.3 28.8  ?Circumference of ankle/heel 38.2  37.9 33.8     34 35.8 ? 35.5 ?  ?5 cm proximal to 1st MTP joint 25.2  25 25      23.8 24.8 24  ?Across MTP joint 25.8  24.8 24.2     23 25  23.8  ?Around proximal great toe  9.8 9.6 ? 9 9 9  8.6  ?(Blank rows = not tested) ? ? ? ?PATIENT EDUCATION: ?09/14/21: given sheet for ordering short stretch and instructed to return her foam next session.  Encouraged to moisturize entire body as she constantly scratched trunk during session today.  Encouraged to soak in tub  and gently scrub off the hemo ?Education details: Reviewed goals, objective findings.  Educated benefits with compression garments, measured and given handout for [employer] purchase knee hig  ?Person educated: Patient ?Education method: Explanation and Demonstration ?Education comprehension: verbalized understanding and returned demonstration ? ? ?HOME  EXERCISE PROGRAM: ?Access Code: [email or web address] ?URL: [email or web address] ?Date: 07/28/2021 ?Prepared by: Josue Hector ? ?Exercises ?Standing Tandem Balance with Counter Support - 2-3 x daily - 7 x weekly - 1 sets - 4 reps - 20-30 second hold ?Sit to Stand with Counter Support - 2-3 x daily - 7 x weekly - 2 sets - 10 reps ?sitting ankle dorsi/plantarflexion, bridge, bent hip flexion/abduction B 3/14 march, hip abd, hip ext ?                       3/24:  side stepping at counter, marching  ?4/19: Access Code: [email or web address] Tandem stance by counter, standing hip extension  ? ? ? ? Eval 07/14/21 08/19/21  ?Assessment   ?Medical Diagnosis gait difficulty   ?Referring Provider (PT) Amado Nash   ?Prior Therapy none   ?Precautions   ?Precautions None   ?Restrictions   ?Weight Bearing Restrictions No   ?Home Environment   ?Living Environment Private residence   ?Home Access Stairs to enter   ?Entrance Stairs-Number of Steps 5   ?Entrance Stairs-Rails Right   ?Additional Comments does not complete reciprocally   ?Prior Function   ?Level of  Independence Independent   ?Vocation Unemployed   ?Leisure pick up basketball games   ?Cognition   ?Overall Cognitive Status Within Functional Limits for tasks assessed   ?Functional Tests   ?Functional tests Single leg

## 2021-09-15 ENCOUNTER — Telehealth: Payer: Self-pay | Admitting: *Deleted

## 2021-09-15 ENCOUNTER — Inpatient Hospital Stay (HOSPITAL_COMMUNITY): Payer: 59 | Attending: Hematology

## 2021-09-15 DIAGNOSIS — E1022 Type 1 diabetes mellitus with diabetic chronic kidney disease: Secondary | ICD-10-CM | POA: Diagnosis not present

## 2021-09-15 DIAGNOSIS — E538 Deficiency of other specified B group vitamins: Secondary | ICD-10-CM | POA: Diagnosis not present

## 2021-09-15 DIAGNOSIS — N184 Chronic kidney disease, stage 4 (severe): Secondary | ICD-10-CM | POA: Diagnosis present

## 2021-09-15 DIAGNOSIS — D649 Anemia, unspecified: Secondary | ICD-10-CM

## 2021-09-15 DIAGNOSIS — Z794 Long term (current) use of insulin: Secondary | ICD-10-CM | POA: Diagnosis not present

## 2021-09-15 DIAGNOSIS — Z79899 Other long term (current) drug therapy: Secondary | ICD-10-CM | POA: Diagnosis not present

## 2021-09-15 DIAGNOSIS — D631 Anemia in chronic kidney disease: Secondary | ICD-10-CM | POA: Diagnosis present

## 2021-09-15 LAB — CBC WITH DIFFERENTIAL/PLATELET
Abs Immature Granulocytes: 0.03 10*3/uL (ref 0.00–0.07)
Basophils Absolute: 0.1 10*3/uL (ref 0.0–0.1)
Basophils Relative: 1 %
Eosinophils Absolute: 0.5 10*3/uL (ref 0.0–0.5)
Eosinophils Relative: 4 %
HCT: 30 % — ABNORMAL LOW (ref 36.0–46.0)
Hemoglobin: 9.6 g/dL — ABNORMAL LOW (ref 12.0–15.0)
Immature Granulocytes: 0 %
Lymphocytes Relative: 16 %
Lymphs Abs: 2 10*3/uL (ref 0.7–4.0)
MCH: 27.3 pg (ref 26.0–34.0)
MCHC: 32 g/dL (ref 30.0–36.0)
MCV: 85.2 fL (ref 80.0–100.0)
Monocytes Absolute: 0.5 10*3/uL (ref 0.1–1.0)
Monocytes Relative: 4 %
Neutro Abs: 9.1 10*3/uL — ABNORMAL HIGH (ref 1.7–7.7)
Neutrophils Relative %: 75 %
Platelets: 194 10*3/uL (ref 150–400)
RBC: 3.52 MIL/uL — ABNORMAL LOW (ref 3.87–5.11)
RDW: 14.8 % (ref 11.5–15.5)
WBC: 12.3 10*3/uL — ABNORMAL HIGH (ref 4.0–10.5)
nRBC: 0 % (ref 0.0–0.2)

## 2021-09-15 LAB — IRON AND TIBC
Iron: 86 ug/dL (ref 28–170)
Saturation Ratios: 42 % — ABNORMAL HIGH (ref 10.4–31.8)
TIBC: 203 ug/dL — ABNORMAL LOW (ref 250–450)
UIBC: 117 ug/dL

## 2021-09-15 LAB — VITAMIN B12: Vitamin B-12: 1852 pg/mL — ABNORMAL HIGH (ref 180–914)

## 2021-09-15 LAB — FERRITIN: Ferritin: 557 ng/mL — ABNORMAL HIGH (ref 11–307)

## 2021-09-15 NOTE — Telephone Encounter (Signed)
Called pt, LMOVM to call back to schedule TCS with Dr. Abbey Chatters, ASA 3, renal prep ?

## 2021-09-16 ENCOUNTER — Ambulatory Visit (HOSPITAL_COMMUNITY): Payer: 59

## 2021-09-16 ENCOUNTER — Encounter (HOSPITAL_COMMUNITY): Payer: Self-pay

## 2021-09-16 DIAGNOSIS — R2689 Other abnormalities of gait and mobility: Secondary | ICD-10-CM

## 2021-09-16 DIAGNOSIS — R600 Localized edema: Secondary | ICD-10-CM

## 2021-09-16 DIAGNOSIS — M6281 Muscle weakness (generalized): Secondary | ICD-10-CM

## 2021-09-16 NOTE — Therapy (Addendum)
?OUTPATIENT PHYSICAL THERAPY Lymphedema TREATMENT NOTE ? ?Patient Name: Lindsey Murillo ?MRN: [id number] ?DOB:1995-08-24, 26 y.o., female ?Today's Date: 09/16/2021 ? ?PCP: Coral Spikes, DO ?REFERRING PROVIDER: Coral Spikes, DO ? ? ?END OF SESSION:  ? PT End of Session - 09/16/21 1520   ? ? Visit Number 18   ? Number of Visits 36  ? Date for PT Re-Evaluation 09/30/21   ? Authorization Type Friday [employer] needs auth after 20 visists paperwork sent in on 5/10  ? Authorization - Visit Number 18   ? Authorization - Number of Visits 30   ? Progress Note Due on Visit 18   ? PT Start Time 1406   ? PT Stop Time 6433   ? PT Time Calculation (min) 73 min   ? Activity Tolerance Patient tolerated treatment well   ? Behavior During Therapy [facility] for tasks assessed/performed   ? ?  ?  ? ?  ? ?  ? ? ? ? ? ? ?Past Medical History:  ?Diagnosis Date  ? Acanthosis nigricans, acquired   ? Asthma   ? Diabetic autonomic neuropathy ([place])   ? Diabetic peripheral neuropathy ([place])   ? Environmental allergies   ? Goiter   ? Hypoglycemia associated with diabetes ([place])   ? Tachycardia   ? Thyroiditis, autoimmune   ? Type 1 diabetes mellitus in patient age 9-19 years with HbA1C goal below 7.5   ? ?Past Surgical History:  ?Procedure Laterality Date  ? BIOPSY  08/27/2016  ? Procedure: BIOPSY;  Surgeon: Danie Binder, MD;  [facility];  Service: Endoscopy;;  duodenum; gastric  ? BIOPSY  06/27/2019  ? Procedure: BIOPSY;  Surgeon: Daneil Dolin, MD;  [facility];  Service: Endoscopy;;  ? COLONOSCOPY    ? ESOPHAGOGASTRODUODENOSCOPY N/A 08/27/2016  ? Dr. Oneida Alar: mild gastritis. Negative celiac. No obvious source for dyspepsia/diarrhea  ? ESOPHAGOGASTRODUODENOSCOPY (EGD) WITH PROPOFOL N/A 06/27/2019  ? rourk: Focal abnormality of the gastric mucosa likely due to trauma (heaving).  Biopsy showed mild gastritis, negative for H. pylori.  Esophageal dilation for history of dysphagia but normal-appearing esophagus.  ? ?Patient Active  Problem List  ? Diagnosis Date Noted  ? Non-adherence to medical treatment 06/29/2021  ? Normocytic anemia 06/26/2021  ? Venous stasis dermatitis 06/17/2021  ? Peripheral edema 06/13/2021  ? Acute renal failure superimposed on stage 4 chronic kidney disease ([place]) 06/12/2021  ? Abdominal pain, left lower quadrant 06/09/2021  ? Iron deficiency anemia 06/02/2021  ? Nephrotic syndrome 05/27/2021  ? Chronic pancreatitis ([place]) 08/22/2019  ? Pancreatic pseudocyst/cyst 08/22/2019  ? GERD (gastroesophageal reflux disease) 08/22/2019  ? 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 Vitamin D deficiency 03/20/2019  ? Uncontrolled type 1 diabetes mellitus with hyperglycemia ([place]) 05/12/2015  ? Essential hypertension, benign 11/30/2012  ? Diabetic peripheral neuropathy ([place])   ? Asthma   ? ? ?REFERRING DIAG: Gait Difficulty ? ?THERAPY DIAG:  ?Other abnormalities of gait and mobility ? ?Muscle weakness (generalized) ? ?Localized edema ? ?PERTINENT HISTORY: 1 diabetes, autoimmune thyroiditis, diabetic neuropathy, nephrotic syndrome, hypertension, asthma, and more ? ?PRECAUTIONS: none ? ?SUBJECTIVE:      Pt forgot the foam again this session.  States she just removed the bandaging prior to today's appointment.  Reports her grandmother ordered her short stretch bandages.   ? ?Are you having pain? No,  0/10  ? ? ? ?TODAY'S TREATMENT:  ?09/16/21: ? ? 09/16/21 0001  ?Knee/Hip Exercises: Seated  ?Long [employer] 2 sets;10 reps  ?  Other Seated Knee/Hip Exercises ankle dorsi/plantarflexion x10  ?Marching 20 reps  ?Sit to Sand 5 reps;with UE support ?(5 STS required HHA increased time to complete 1'45")  ?Knee/Hip Exercises: Supine  ?Bridges 2 sets;10 reps  ?Manual Therapy  ?Manual Therapy Manual Lymphatic Drainage (MLD);Compression Bandaging;Other (comment)  ?Manual therapy comments Manual complete separate than rest of tx  ?Manual Lymphatic Drainage (MLD) Manual lymph decongestive techniques including short neck, superficial and deep  abdomnial, inginueal to axillary anastomoses  ?Compression Bandaging Profore, pt forgot foam to bring next session.  ?Other Manual Therapy Measurement  ? ?09/14/21 ?Manual Therapy  ?Manual Therapy Manual Lymphatic Drainage (MLD);Compression Bandaging;Other (comment)  ?Manual therapy comments Manual complete separate than rest of tx  ?Manual Lymphatic Drainage (MLD) Manual lymph decongestive techniques including short neck, superficial and deep abdomnial, inginueal to axillary anastomoses completed anterior and posteriorly   ?Compression Bandaging Profore, pt forgot foam  ?Other Given sheet for ordering short stretch  ? ?09/11/21 ?Manual Therapy  ?Manual Therapy Manual Lymphatic Drainage (MLD);Compression Bandaging;Other (comment)  ?Manual therapy comments Manual complete separate than rest of tx  ?Manual Lymphatic Drainage (MLD) Manual lymph decongestive techniques including short neck, superficial and deep abdomnial, inginueal to axillary anastomoses completed anterior and posteriorly   ?Compression Bandaging Profore, pt forgot foam  ? ? ? ? 09/09/21 0001  ?Knee/Hip Exercises:   ?All four  Single leg raise x 10 B; hip abduction x 10   ?Sit to Sand 10 reps;without UE support  ?   ?Manual Therapy  ?Manual Therapy Manual Lymphatic Drainage (MLD);Compression Bandaging;Other (comment)  ?Manual therapy comments Manual complete separate than rest of tx  ?Manual Lymphatic Drainage (MLD) Manual lymph decongestive techniques including short neck, superficial and deep abdomnial, inginueal to axillary anastomoses completed anterior and posteriorly   ?Compression Bandaging Profore, pt forgot foam  ?   ? ? ?09/03/21: ? ? 09/03/21 0001  ?Knee/Hip Exercises: Seated  ?Long [employer] 2 sets;10 reps  ?Sit to Sand 10 reps;without UE support  ?Marching 20 reps  ?Manual Therapy  ?Manual Therapy Manual Lymphatic Drainage (MLD);Compression Bandaging;Other (comment)  ?Manual therapy comments Manual complete separate than rest of tx  ?Manual  Lymphatic Drainage (MLD) Manual lymph decongestive techniques including short neck, superficial and deep abdomnial, inginueal to axillary anastomoses  ?Compression Bandaging Profore, pt forgot foam to bring next session.  ?Other Manual Therapy Measurement  ? ? ? ?08/28/2021:   ? ? ?LE LANDMARK RIGHT ?4/21 Left  ?4/21 Right  ?09/03/21 Left  ?09/03/21 Right ?09/11/21 Left ?09/11/21 Right ?09/16/21 Left  ?09/16/21  ?At groin           ?30 cm proximal to suprapatella           ?20 cm proximal to suprapatella           ?10 cm proximal to suprapatella           ?At [place] / popliteal crease           ?30 cm proximal to floor at lateral plantar foot  42.8  43.6 32.3 38 40 40.3 35.2 39  ?20 cm proximal to floor at lateral plantar foot  36.3  34.8 32.9     33.4 34.8 31.3 33.5 32.5  ?10 cm proximal to floor at lateral plantar foot  30.7  31.7 29     30  30.3 28.8 28.7 29.3  ?Circumference of ankle/heel 38.2  37.9 33.8     34 35.8 ? 35.5 ? 35.2 34.3  ?5 cm proximal to  1st MTP joint 25.2  25 25      23.8 24.8 24 24  23.3  ?Across MTP joint 25.8  24.8 24.2     23 25  23.8 23.7 23  ?Around proximal great toe  9.8 9.6 ? 9 9 9  8.6 8.8 9  ?(Blank rows = not tested) ? ? ? ?PATIENT EDUCATION: ?09/16/21:  Given HEP print out to complete 3x daily. ?09/14/21: given sheet for ordering short stretch and instructed to return her foam next session.  Encouraged to moisturize entire body as she constantly scratched trunk during session today.  Encouraged to soak in tub  and gently scrub off the hemo ?Education details: Reviewed goals, objective findings.  Educated benefits with compression garments, measured and given handout for [employer] purchase knee hig  ?Person educated: Patient ?Education method: Explanation and Demonstration ?Education comprehension: verbalized understanding and returned demonstration ? ? ?HOME EXERCISE PROGRAM: ?Access Code: [email or web address] ?URL: [email or web address] ?Date: 07/28/2021 ?Prepared by: Josue Hector ? ?Exercises ?Standing Tandem Balance with Counter Support - 2-3 x daily - 7 x weekly - 1 sets - 4 reps - 20-30 second hold ?Sit to Stand with Counter Support - 2-3 x daily - 7 x weekly - 2 sets - 10 reps ?sitting an

## 2021-09-17 ENCOUNTER — Encounter (HOSPITAL_COMMUNITY): Payer: 59

## 2021-09-18 ENCOUNTER — Ambulatory Visit (HOSPITAL_COMMUNITY): Payer: 59

## 2021-09-21 ENCOUNTER — Ambulatory Visit (HOSPITAL_COMMUNITY): Payer: 59 | Admitting: Physical Therapy

## 2021-09-21 DIAGNOSIS — R2689 Other abnormalities of gait and mobility: Secondary | ICD-10-CM | POA: Diagnosis not present

## 2021-09-21 DIAGNOSIS — R600 Localized edema: Secondary | ICD-10-CM

## 2021-09-21 LAB — METHYLMALONIC ACID, SERUM: Methylmalonic Acid, Quantitative: 369 nmol/L (ref 0–378)

## 2021-09-21 NOTE — Therapy (Signed)
?OUTPATIENT PHYSICAL THERAPY Lymphedema TREATMENT NOTE ? ?Patient Name: Lindsey Murillo ?MRN: [id number] ?DOB:01/11/1996, 26 y.o., female ?Today's Date: 09/21/2021 ? ?PCP: Coral Spikes, DO ?REFERRING PROVIDER: Coral Spikes, DO ? ? ?END OF SESSION:  ? PT End of Session - 09/16/21 1520   ? ? Visit Number 19  ? Number of Visits 36  ? Date for PT Re-Evaluation 09/30/21   ? Authorization Type [employer] needs auth after 20 visists paperwork sent in on 5/10  ? Authorization - Visit Number 19  ? Authorization - Number of Visits 30   ? Progress Note Due on Visit 18   ? PT Start Time 1330 -pt late   ? PT Stop Time 1440  ? PT Time Calculation (min) 70 min   ? Activity Tolerance Patient tolerated treatment well   ? Behavior During Therapy [facility] for tasks assessed/performed   ? ?  ?  ? ?  ? ?  ? ? ? ? ? ? ?Past Medical History:  ?Diagnosis Date  ? Acanthosis nigricans, acquired   ? Asthma   ? Diabetic autonomic neuropathy ([place])   ? Diabetic peripheral neuropathy ([name])   ? Environmental allergies   ? Goiter   ? Hypoglycemia associated with diabetes ([place])   ? Tachycardia   ? Thyroiditis, autoimmune   ? Type 1 diabetes mellitus in patient age 51-19 years with HbA1C goal below 7.5   ? ?Past Surgical History:  ?Procedure Laterality Date  ? BIOPSY  08/27/2016  ? Procedure: BIOPSY;  Surgeon: Danie Binder, MD;  [facility];  Service: Endoscopy;;  duodenum; gastric  ? BIOPSY  06/27/2019  ? Procedure: BIOPSY;  Surgeon: Daneil Dolin, MD;  [facility];  Service: Endoscopy;;  ? COLONOSCOPY    ? ESOPHAGOGASTRODUODENOSCOPY N/A 08/27/2016  ? Dr. Oneida Alar: mild gastritis. Negative celiac. No obvious source for dyspepsia/diarrhea  ? ESOPHAGOGASTRODUODENOSCOPY (EGD) WITH PROPOFOL N/A 06/27/2019  ? rourk: Focal abnormality of the gastric mucosa likely due to trauma (heaving).  Biopsy showed mild gastritis, negative for H. pylori.  Esophageal dilation for history of dysphagia but normal-appearing esophagus.  ? ?Patient  Active Problem List  ? Diagnosis Date Noted  ? Non-adherence to medical treatment 06/29/2021  ? Normocytic anemia 06/26/2021  ? Venous stasis dermatitis 06/17/2021  ? Peripheral edema 06/13/2021  ? Acute renal failure superimposed on stage 4 chronic kidney disease ([place]) 06/12/2021  ? Abdominal pain, left lower quadrant 06/09/2021  ? Iron deficiency anemia 06/02/2021  ? Nephrotic syndrome 05/27/2021  ? Chronic pancreatitis ([place]) 08/22/2019  ? Pancreatic pseudocyst/cyst 08/22/2019  ? GERD (gastroesophageal reflux disease) 08/22/2019  ? 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 Vitamin D deficiency 03/20/2019  ? Uncontrolled type 1 diabetes mellitus with hyperglycemia ([place]) 05/12/2015  ? Essential hypertension, benign 11/30/2012  ? Diabetic peripheral neuropathy ([place])   ? Asthma   ? ? ?REFERRING DIAG: Gait Difficulty ? ?THERAPY DIAG:  ?Other abnormalities of gait and mobility ? ?Localized edema ? ?PERTINENT HISTORY: 1 diabetes, autoimmune thyroiditis, diabetic neuropathy, nephrotic syndrome, hypertension, asthma, and more ? ?PRECAUTIONS: none ? ?SUBJECTIVE:      Pt in a very somber mood today.  States that her back has been hurting since this weekend.  States exercises exacerbates her back pain. States ?That she was unable to make it last time secondary to her mom having surgery on her foot.  ?Are you having pain? Yes   7/10 low back aching.  This is not what we are seeing pt  for but we will hold LE strengthening until back is feeling  better.  ? ? ? ?TODAY'S TREATMENT:  ?09/21/2021 ?Manual Therapy  ?Manual Therapy Manual Lymphatic Drainage (MLD);Compression Bandaging;Other (comment)  ?Manual therapy comments Manual complete separate than rest of tx  ?Manual Lymphatic Drainage (MLD) Manual lymph decongestive techniques including short neck, superficial and deep abdomnial, inginueal to axillary anastomosis followed by LE.  Manual completed both anterior and posteriorly.  ?Compression Bandaging Profore with foam    ? ?09/16/21: ? ? 09/16/21 0001  ?Knee/Hip Exercises: Seated  ?Long [employer] 2 sets;10 reps  ?Other Seated Knee/Hip Exercises ankle dorsi/plantarflexion x10  ?Marching 20 reps  ?Sit to Sand 5 reps;with UE support ?(5 STS required HHA increased time to complete 1'45")  ?Knee/Hip Exercises: Supine  ?Bridges 2 sets;10 reps  ?Manual Therapy  ?Manual Therapy Manual Lymphatic Drainage (MLD);Compression Bandaging;Other (comment)  ?Manual therapy comments Manual complete separate than rest of tx  ?Manual Lymphatic Drainage (MLD) Manual lymph decongestive techniques including short neck, superficial and deep abdomnial, inginueal to axillary anastomoses  ?Compression Bandaging Profore, pt forgot foam to bring next session.  ?Other Manual Therapy Measurement  ? ?09/14/21 ?Manual Therapy  ?Manual Therapy Manual Lymphatic Drainage (MLD);Compression Bandaging;Other (comment)  ?Manual therapy comments Manual complete separate than rest of tx  ?Manual Lymphatic Drainage (MLD) Manual lymph decongestive techniques including short neck, superficial and deep abdomnial, inginueal to axillary anastomoses completed anterior and posteriorly   ?Compression Bandaging Profore, pt forgot foam  ?Other Given sheet for ordering short stretch  ? ?09/11/21 ?Manual Therapy  ?Manual Therapy Manual Lymphatic Drainage (MLD);Compression Bandaging;Other (comment)  ?Manual therapy comments Manual complete separate than rest of tx  ?Manual Lymphatic Drainage (MLD) Manual lymph decongestive techniques including short neck, superficial and deep abdomnial, inginueal to axillary anastomoses completed anterior and posteriorly   ?Compression Bandaging Profore, pt forgot foam  ? ? ? ? 09/09/21 0001  ?Knee/Hip Exercises:   ?All four  Single leg raise x 10 B; hip abduction x 10   ?Sit to Sand 10 reps;without UE support  ?   ?Manual Therapy  ?Manual Therapy Manual Lymphatic Drainage (MLD);Compression Bandaging;Other (comment)  ?Manual therapy comments Manual complete  separate than rest of tx  ?Manual Lymphatic Drainage (MLD) Manual lymph decongestive techniques including short neck, superficial and deep abdomnial, inginueal to axillary anastomoses completed anterior and posteriorly   ?Compression Bandaging Profore, pt forgot foam  ?   ? ? ?09/03/21: ? ? 09/03/21 0001  ?Knee/Hip Exercises: Seated  ?Long [employer] 2 sets;10 reps  ?Sit to Sand 10 reps;without UE support  ?Marching 20 reps  ?Manual Therapy  ?Manual Therapy Manual Lymphatic Drainage (MLD);Compression Bandaging;Other (comment)  ?Manual therapy comments Manual complete separate than rest of tx  ?Manual Lymphatic Drainage (MLD) Manual lymph decongestive techniques including short neck, superficial and deep abdomnial, inginueal to axillary anastomoses  ?Compression Bandaging Profore, pt forgot foam to bring next session.  ?Other Manual Therapy Measurement  ? ? ? ?08/28/2021:   ? ? ?LE LANDMARK RIGHT ?4/21 Left  ?4/21 Right  ?09/03/21 Left  ?09/03/21 Right ?09/11/21 Left ?09/11/21 Right ?09/16/21 Left  ?09/16/21  ?At groin           ?30 cm proximal to suprapatella           ?20 cm proximal to suprapatella           ?10 cm proximal to suprapatella           ?At [place] / popliteal crease           ?  30 cm proximal to floor at lateral plantar foot  42.8  43.6 32.3 38 40 40.3 35.2 39  ?20 cm proximal to floor at lateral plantar foot  36.3  34.8 32.9     33.4 34.8 31.3 33.5 32.5  ?10 cm proximal to floor at lateral plantar foot  30.7  31.7 29     30  30.3 28.8 28.7 29.3  ?Circumference of ankle/heel 38.2  37.9 33.8     34 35.8 ? 35.5 ? 35.2 34.3  ?5 cm proximal to 1st MTP joint 25.2  25 25      23.8 24.8 24 24  23.3  ?Across MTP joint 25.8  24.8 24.2     23 25  23.8 23.7 23  ?Around proximal great toe  9.8 9.6 ? 9 9 9  8.6 8.8 9  ?(Blank rows = not tested) ? ? ? ?PATIENT EDUCATION: ?09/16/21:  Given HEP print out to complete 3x daily. ?09/14/21: given sheet for ordering short stretch and instructed to return her foam next session.  Encouraged  to moisturize entire body as she constantly scratched trunk during session today.  Encouraged to soak in tub  and gently scrub off the hemo ?Education details: Reviewed goals, objective findings.  Educated

## 2021-09-22 ENCOUNTER — Inpatient Hospital Stay (HOSPITAL_COMMUNITY): Payer: 59 | Admitting: Hematology

## 2021-09-23 ENCOUNTER — Ambulatory Visit (HOSPITAL_COMMUNITY): Payer: 59 | Admitting: Physical Therapy

## 2021-09-23 DIAGNOSIS — R2689 Other abnormalities of gait and mobility: Secondary | ICD-10-CM

## 2021-09-23 DIAGNOSIS — R600 Localized edema: Secondary | ICD-10-CM

## 2021-09-23 DIAGNOSIS — M6281 Muscle weakness (generalized): Secondary | ICD-10-CM

## 2021-09-23 NOTE — Therapy (Signed)
?OUTPATIENT PHYSICAL THERAPY Lymphedema TREATMENT NOTE ? ?Patient Name: Lindsey Murillo ?MRN: [id number] ?DOB:1996-02-12, 26 y.o., female ?Today's Date: 09/23/2021 ? ?PCP: Coral Spikes, DO ?REFERRING PROVIDER: Coral Spikes, DO ? ? ?END OF SESSION:  ? PT End of Session - 09/16/21 1520   ? ? Visit Number 19  ? Number of Visits 36  ? Date for PT Re-Evaluation 09/30/21   ? Authorization Type [employer] needs auth after 20 visists paperwork sent in on 5/10  ? Authorization - Visit Number 19  ? Authorization - Number of Visits 30   ? Progress Note Due on Visit 18   ? PT Start Time 1330 -pt late   ? PT Stop Time 1440  ? PT Time Calculation (min) 70 min   ? Activity Tolerance Patient tolerated treatment well   ? Behavior During Therapy [facility] for tasks assessed/performed   ? ?  ?  ? ?  ? ?  ? ? ? ? ? ? ?Past Medical History:  ?Diagnosis Date  ? Acanthosis nigricans, acquired   ? Asthma   ? Diabetic autonomic neuropathy ([place])   ? Diabetic peripheral neuropathy ([place])   ? Environmental allergies   ? Goiter   ? Hypoglycemia associated with diabetes ([place])   ? Tachycardia   ? Thyroiditis, autoimmune   ? Type 1 diabetes mellitus in patient age 23-19 years with HbA1C goal below 7.5   ? ?Past Surgical History:  ?Procedure Laterality Date  ? BIOPSY  08/27/2016  ? Procedure: BIOPSY;  Surgeon: Danie Binder, MD;  [facility];  Service: Endoscopy;;  duodenum; gastric  ? BIOPSY  06/27/2019  ? Procedure: BIOPSY;  Surgeon: Daneil Dolin, MD;  [facility];  Service: Endoscopy;;  ? COLONOSCOPY    ? ESOPHAGOGASTRODUODENOSCOPY N/A 08/27/2016  ? Dr. Oneida Alar: mild gastritis. Negative celiac. No obvious source for dyspepsia/diarrhea  ? ESOPHAGOGASTRODUODENOSCOPY (EGD) WITH PROPOFOL N/A 06/27/2019  ? rourk: Focal abnormality of the gastric mucosa likely due to trauma (heaving).  Biopsy showed mild gastritis, negative for H. pylori.  Esophageal dilation for history of dysphagia but normal-appearing esophagus.  ? ?Patient  Active Problem List  ? Diagnosis Date Noted  ? Non-adherence to medical treatment 06/29/2021  ? Normocytic anemia 06/26/2021  ? Venous stasis dermatitis 06/17/2021  ? Peripheral edema 06/13/2021  ? Acute renal failure superimposed on stage 4 chronic kidney disease ([place]) 06/12/2021  ? Abdominal pain, left lower quadrant 06/09/2021  ? Iron deficiency anemia 06/02/2021  ? Nephrotic syndrome 05/27/2021  ? Chronic pancreatitis ([place]) 08/22/2019  ? Pancreatic pseudocyst/cyst 08/22/2019  ? GERD (gastroesophageal reflux disease) 08/22/2019  ? 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 Vitamin D deficiency 03/20/2019  ? Uncontrolled type 1 diabetes mellitus with hyperglycemia ([place]) 05/12/2015  ? Essential hypertension, benign 11/30/2012  ? Diabetic peripheral neuropathy ([place])   ? Asthma   ? ? ?REFERRING DIAG: Gait Difficulty ? ?THERAPY DIAG:  ?Other abnormalities of gait and mobility ? ?Localized edema ? ?Muscle weakness (generalized) ? ?PERTINENT HISTORY: 1 diabetes, autoimmune thyroiditis, diabetic neuropathy, nephrotic syndrome, hypertension, asthma, and more ? ?PRECAUTIONS: none ? ?SUBJECTIVE:      Pt reports her pain is less today in her back, moving better.  Has not got her short stretch.  states she is going to call her insurance company as she got a notification to contact them ?Are you having pain? Yes   7/10 low back aching.  This is not what we are seeing pt for but we will  hold LE strengthening until back is feeling  better.  ? ? ? ?TODAY'S TREATMENT:  ?09/23/2021 ?Manual Therapy  ?Manual Therapy Manual Lymphatic Drainage (MLD);Compression Bandaging;Other (comment)  ?Manual therapy comments Manual complete separate than rest of tx  ?Manual Lymphatic Drainage (MLD) Manual lymph decongestive techniques including short neck, superficial and deep abdomnial, inginueal to axillary anastomosis followed by LE.  Manual completed both anterior and posteriorly.  ?Compression Bandaging Profore with foam    ? ? ?09/21/2021 ?Manual Therapy  ?Manual Therapy Manual Lymphatic Drainage (MLD);Compression Bandaging;Other (comment)  ?Manual therapy comments Manual complete separate than rest of tx  ?Manual Lymphatic Drainage (MLD) Manual lymph decongestive techniques including short neck, superficial and deep abdomnial, inginueal to axillary anastomosis followed by LE.  Manual completed both anterior and posteriorly.  ?Compression Bandaging Profore with foam   ? ?09/16/21: ? ? 09/16/21 0001  ?Knee/Hip Exercises: Seated  ?Long [employer] 2 sets;10 reps  ?Other Seated Knee/Hip Exercises ankle dorsi/plantarflexion x10  ?Marching 20 reps  ?Sit to Sand 5 reps;with UE support ?(5 STS required HHA increased time to complete 1'45")  ?Knee/Hip Exercises: Supine  ?Bridges 2 sets;10 reps  ?Manual Therapy  ?Manual Therapy Manual Lymphatic Drainage (MLD);Compression Bandaging;Other (comment)  ?Manual therapy comments Manual complete separate than rest of tx  ?Manual Lymphatic Drainage (MLD) Manual lymph decongestive techniques including short neck, superficial and deep abdomnial, inginueal to axillary anastomoses  ?Compression Bandaging Profore, pt forgot foam to bring next session.  ?Other Manual Therapy Measurement  ? ? ? ? ? ? ? ?LE LANDMARK Left  ?4/21 Left  ?09/03/21 Left ?09/11/21 Left  ?09/16/21 Left  ?09/23/21  ?At groin       ?30 cm proximal to suprapatella       ?20 cm proximal to suprapatella       ?10 cm proximal to suprapatella       ?At [place] / popliteal crease       ?40 cm proximal to floor at lateral plantar foot     48.5  ?30 cm proximal to floor at lateral plantar foot  43.6 38 40.3 39 40  ?20 cm proximal to floor at lateral plantar foot  34.8     33.4 31.3 32.5 32  ?10 cm proximal to floor at lateral plantar foot  31.7     30 28.8 29.3 28.9  ?Circumference of ankle/heel  37.9     34 35.5 ? 34.3 35  ?5 cm proximal to 1st MTP joint  25     23.8 24 23.3 24  ?Across MTP joint  24.8     23 23.8 23 23   ?Around proximal great toe  9.6 ? 9 8.6 9 8.5  ?(Blank rows = not tested) ? ?LE LANDMARK RIGHT ?4/21 Right  ?09/03/21 Right ?09/11/21 Right ?09/16/21 Right  ?09/23/21  ?At groin        ?30 cm proximal to suprapatella        ?20 cm proximal to suprapatella        ?10 cm proximal to suprapatella        ?At [place] / popliteal crease        ?40 cm proximal to floor at lateral plantar foot     44  ?30 cm proximal to floor at lateral plantar foot  42.8 32.3 40 35.2 36.4  ?20 cm proximal to floor at lateral plantar foot  36.3 32.9 34.8 33.5 31  ?10 cm proximal to floor at lateral plantar foot  30.7 29 30.3  28.7 28.3  ?Circumference of ankle/heel 38.2 33.8 35.8 ? 35.2 35  ?5 cm proximal to 1st MTP joint 25.2 25 24.8 24 24.2  ?Across MTP joint 25.8 24.2 25 23.7 24.3  ?Around proximal great toe  9.8 9 9  8.8 8.5  ? ? ?PATIENT EDUCATION: ?09/16/21:  Given HEP print out to complete 3x daily. ?09/14/21: given sheet for ordering short stretch and instructed to return her foam next session.  Encouraged to moisturize entire body as she constantly scratched trunk during session today.  Encouraged to soak in tub  and gently scrub off the hemo ?Education details: Reviewed goals, objective findings.  Educated benefits with compression garments, measured and given handout for [employer] purchase knee hig  ?Person educated: Patient ?Education method: Explanation and Demonstration ?Education comprehension: verbalized understanding and returned demonstration ? ? ?HOME EXERCISE PROGRAM: ?Access Code: [email or web address] ?URL: [email or web address] ?Date: 07/28/2021 ?Prepared by: Josue Hector ? ?Exercises ?Standing Tandem Balance with Counter Support - 2-3 x daily - 7 x weekly - 1 sets - 4 reps - 20-30 second hold ?Sit to Stand with Counter Support - 2-3 x daily - 7 x weekly - 2 sets - 10 reps ?sitting ankle dorsi/plantarflexion, bridge, bent hip flexion/abduction B 3/14 march, hip abd, hip ext ?                       3/24:  side stepping at counter, marching  ?4/19:  Access Code: [email or web address] Tandem stance by counter, standing hip extension  ? ? ? ? Eval 07/14/21 08/19/21 09/16/21  ?Assessment    ?Medical Diagnosis gait difficulty    ?Referring Provider (PT) Amado Nash    ?Prior Therapy n

## 2021-09-24 ENCOUNTER — Ambulatory Visit (INDEPENDENT_AMBULATORY_CARE_PROVIDER_SITE_OTHER): Payer: 59 | Admitting: Family Medicine

## 2021-09-24 ENCOUNTER — Other Ambulatory Visit: Payer: Self-pay

## 2021-09-24 ENCOUNTER — Telehealth (HOSPITAL_COMMUNITY): Payer: Self-pay | Admitting: Physical Therapy

## 2021-09-24 VITALS — BP 134/83 | HR 92 | Ht 67.0 in | Wt 142.8 lb

## 2021-09-24 DIAGNOSIS — E1065 Type 1 diabetes mellitus with hyperglycemia: Secondary | ICD-10-CM | POA: Diagnosis not present

## 2021-09-24 DIAGNOSIS — I10 Essential (primary) hypertension: Secondary | ICD-10-CM | POA: Diagnosis not present

## 2021-09-24 DIAGNOSIS — F32A Depression, unspecified: Secondary | ICD-10-CM

## 2021-09-24 DIAGNOSIS — D649 Anemia, unspecified: Secondary | ICD-10-CM

## 2021-09-24 DIAGNOSIS — N049 Nephrotic syndrome with unspecified morphologic changes: Secondary | ICD-10-CM | POA: Diagnosis not present

## 2021-09-24 MED ORDER — PEG 3350-KCL-NA BICARB-NACL 420 G PO SOLR: 4000.0000 mL | ORAL | 0 refills | Status: DC

## 2021-09-24 NOTE — Telephone Encounter (Signed)
Spoke to pt, TCS scheduled for 10/19/21 at 2:45pm. Rx for prep sent to pharmacy. Orders entered.

## 2021-09-24 NOTE — Telephone Encounter (Signed)
Signed order received for pump.  All information faxed to [facility] patient and left VM regarding insurance/visits and need to call clinic.  Pt has no further appointments scheduled.  Pt no longer has insurance and will need to proceed as a self pay patient.  Also, have measurements for her to call and obtain knee high compression garments from [employer] if chooses to discontinue at this point.  Teena Irani, PTA/CLT [place] Ph: [phone]

## 2021-09-24 NOTE — Patient Instructions (Signed)
Labs today  Follow up in 3 months.  Do something you enjoy!

## 2021-09-24 NOTE — Progress Notes (Signed)
Lindsey Murillo, [place] [address]   CLINIC:  Medical Oncology/Hematology  PCP:  Coral Spikes, DO [place] [address] [phone]   REASON FOR VISIT:  Follow-up for normocytic anemia  PRIOR THERAPY: IV iron  CURRENT THERAPY: Under work-up  INTERVAL HISTORY:  Lindsey Murillo 26 y.o. female returns for routine follow-up of her normocytic anemia.  She was last seen in clinic by Dr. Delton Coombes on 07/13/2021.  At today's visit, she reports feeling fair. She was hospitalized from 06/12/2021 through 06/23/2021 for acute on chronic renal failure and anemia of chronic disease.  During her hospitalization, her Hgb was down to 6.4.  She was transfused 2 units of PRBCs and treated with IV iron and ESA therapy during hospitalization.  Hgb was 10.7 at the time of discharge.  She reports that she continues to follow with nephrology, with her most recent appointment in April.  She has not had any ESA since her hospital stay in February.  She denies any symptoms of bleeding such as epistaxis, hematemesis, hematochezia, or melena.  She has fatigue, with energy about 65%, that comes and goes.  She has intermittent chest pain, but does not have any today.  She denies dyspnea on exertion, lightheadedness, or syncope.  No symptoms of pica, restless legs, or headaches.  She has 65% energy and 80% appetite. She endorses that she is maintaining a stable weight.   REVIEW OF SYSTEMS:  Review of Systems  Constitutional:  Positive for fatigue. Negative for appetite change, chills, diaphoresis, fever and unexpected weight change.  HENT:   Negative for lump/mass and nosebleeds.   Eyes:  Negative for eye problems.  Respiratory:  Negative for cough, hemoptysis and shortness of breath.   Cardiovascular:  Positive for leg swelling. Negative for chest pain and palpitations.  Gastrointestinal:  Negative for abdominal pain, blood in stool, constipation, diarrhea,  nausea and vomiting.  Genitourinary:  Negative for hematuria.   Skin: Negative.   Neurological:  Negative for dizziness, headaches and light-headedness.  Hematological:  Does not bruise/bleed easily.     PAST MEDICAL/SURGICAL HISTORY:  Past Medical History:  Diagnosis Date   Acanthosis nigricans, acquired    Asthma    Diabetic autonomic neuropathy ([name])    Diabetic peripheral neuropathy ([name])    Environmental allergies    Goiter    Hypoglycemia associated with diabetes ([name])    Tachycardia    Thyroiditis, autoimmune    Type 1 diabetes mellitus in patient age 27-19 years with HbA1C goal below 7.5    Past Surgical History:  Procedure Laterality Date   BIOPSY  08/27/2016   Procedure: BIOPSY;  Surgeon: Danie Binder, MD;  Location: [facility];  Service: Endoscopy;;  duodenum; gastric   BIOPSY  06/27/2019   Procedure: BIOPSY;  Surgeon: Daneil Dolin, MD;  Location: [facility];  Service: Endoscopy;;   COLONOSCOPY     ESOPHAGOGASTRODUODENOSCOPY N/A 08/27/2016   Dr. Oneida Alar: mild gastritis. Negative celiac. No obvious source for dyspepsia/diarrhea   ESOPHAGOGASTRODUODENOSCOPY (EGD) WITH PROPOFOL N/A 06/27/2019   rourk: Focal abnormality of the gastric mucosa likely due to trauma (heaving).  Biopsy showed mild gastritis, negative for H. pylori.  Esophageal dilation for history of dysphagia but normal-appearing esophagus.     SOCIAL HISTORY:  Social History   Socioeconomic History   Marital status: Single    Spouse name: Not on file   Number of children: 0   Years of education:  Not on file   Highest education level: High school graduate  Occupational History   Occupation: unemployed  Tobacco Use   Smoking status: Never   Smokeless tobacco: Never  Vaping Use   Vaping Use: Never used  Substance and Sexual Activity   Alcohol use: No   Drug use: No   Sexual activity: Never    Birth control/protection: Abstinence  Other Topics Concern   Not on file  Social History  Narrative   Not on file   Social Determinants of Health   Financial Resource Strain: Not on file  Food Insecurity: Not on file  Transportation Needs: Not on file  Physical Activity: Not on file  Stress: Not on file  Social Connections: Not on file  Intimate Partner Violence: Not on file    FAMILY HISTORY:  Family History  Problem Relation Age of Onset   Diabetes Mother        Type II DM   Thyroid disease Mother    Diabetes Maternal Grandmother        Type II DM   Diabetes Cousin        Type II DM   Colon cancer Neg Hx    Colon polyps Neg Hx     CURRENT MEDICATIONS:  Outpatient Encounter Medications as of 09/25/2021  Medication Sig   acetaminophen (TYLENOL) 325 MG tablet Take 650 mg by mouth every 6 (six) hours as needed (for pain).   blood glucose meter kit and supplies KIT Dispense based on patient and insurance preference. Use up to four times daily as directed.   calcium acetate, Phos Binder, (PHOSLYRA) 667 MG/5ML SOLN Take by mouth 3 (three) times daily with meals.   Continuous Blood Gluc Receiver (FREESTYLE LIBRE 2 READER) DEVI As directed   Continuous Blood Gluc Sensor (FREESTYLE LIBRE 2 SENSOR) MISC 1 Piece by Does not apply route every 14 (fourteen) days.   feeding supplement (ENSURE ENLIVE / ENSURE PLUS) LIQD Take 237 mLs by mouth 2 (two) times daily between meals. (Patient not taking: Reported on 08/27/2021)   insulin glargine-yfgn (SEMGLEE) 100 UNIT/ML injection 12 Units daily.   Insulin Pen Needle 31G X 4 MM MISC Use as directed to administer insulin.   metoprolol tartrate (LOPRESSOR) 50 MG tablet Take 50 mg by mouth 2 (two) times daily.   polyethylene glycol-electrolytes (TRILYTE) 420 g solution Take 4,000 mLs by mouth as directed.   potassium chloride (KLOR-CON) 20 MEQ packet Take 40 mEq by mouth daily.   rosuvastatin (CRESTOR) 10 MG tablet Take 1 tablet (10 mg total) by mouth daily.   torsemide (DEMADEX) 20 MG tablet Take 60 mg by mouth 2 (two) times daily.    vitamin B-12 (CYANOCOBALAMIN) 1000 MCG tablet Take 1,000 mcg by mouth daily.   No facility-administered encounter medications on file as of 09/25/2021.    ALLERGIES:  No Known Allergies   PHYSICAL EXAM:  ECOG PERFORMANCE STATUS: 2 - Symptomatic, <50% confined to bed  There were no vitals filed for this visit. There were no vitals filed for this visit. Physical Exam Constitutional:      Appearance: Normal appearance.     Comments: Presents in wheelchair  HENT:     Head: Normocephalic and atraumatic.     Mouth/Throat:     Mouth: Mucous membranes are moist.  Eyes:     Extraocular Movements: Extraocular movements intact.     Pupils: Pupils are equal, round, and reactive to light.  Cardiovascular:     Rate and Rhythm: Normal  rate and regular rhythm.     Pulses: Normal pulses.     Heart sounds: Normal heart sounds.  Pulmonary:     Effort: Pulmonary effort is normal.     Breath sounds: Normal breath sounds.  Abdominal:     General: Bowel sounds are normal.     Palpations: Abdomen is soft.     Tenderness: There is no abdominal tenderness.  Musculoskeletal:        General: No swelling.     Right lower leg: Edema (3+ edema, compression wrap in place) present.     Left lower leg: Edema (3+ edema, compression wrap in place) present.  Lymphadenopathy:     Cervical: No cervical adenopathy.  Skin:    General: Skin is warm and dry.  Neurological:     General: No focal deficit present.     Mental Status: She is alert and oriented to person, place, and time.  Psychiatric:        Mood and Affect: Mood normal.        Behavior: Behavior normal.     LABORATORY DATA:  I have reviewed the labs as listed.  CBC    Component Value Date/Time   WBC 12.3 (H) 09/15/2021 1117   RBC 3.52 (L) 09/15/2021 1117   HGB 9.6 (L) 09/15/2021 1117   HGB 10.7 (L) 06/26/2021 1128   HCT 30.0 (L) 09/15/2021 1117   HCT 33.2 (L) 06/26/2021 1128   PLT 194 09/15/2021 1117   PLT 265 06/26/2021 1128   MCV  85.2 09/15/2021 1117   MCV 89 06/26/2021 1128   MCH 27.3 09/15/2021 1117   MCHC 32.0 09/15/2021 1117   RDW 14.8 09/15/2021 1117   RDW 14.5 06/26/2021 1128   LYMPHSABS 2.0 09/15/2021 1117   MONOABS 0.5 09/15/2021 1117   EOSABS 0.5 09/15/2021 1117   BASOSABS 0.1 09/15/2021 1117      Latest Ref Rng & Units 06/26/2021   11:28 AM 06/23/2021    5:46 AM 06/22/2021    4:56 AM  CMP  Glucose 70 - 99 mg/dL 100   72   97    BUN 6 - 20 mg/dL 50   53   51    Creatinine 0.57 - 1.00 mg/dL 3.08   2.99   2.90    Sodium 134 - 144 mmol/L 142   140   139    Potassium 3.5 - 5.2 mmol/L 4.4   3.3   3.5    Chloride 96 - 106 mmol/L 107   108   105    CO2 20 - 29 mmol/L 20   20   23     Calcium 8.7 - 10.2 mg/dL 8.9   8.6   8.6    Total Protein 6.0 - 8.5 g/dL 6.0      Total Bilirubin 0.0 - 1.2 mg/dL <0.2      Alkaline Phos 44 - 121 IU/L 318      AST 0 - 40 IU/L 24      ALT 0 - 32 IU/L 38        DIAGNOSTIC IMAGING:  I have independently reviewed the relevant imaging and discussed with the patient.  ASSESSMENT & PLAN: 1.  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 Marval Regal (nephrology) for work-up of normocytic anemia. - Labs from 01/08/2021 showed hemoglobin 7.9 with MCV 85.  White count and platelet count was normal.  Normal differential.  Ferritin was 155 and percent  saturation was 25. - Hematology work-up (05/14/2021) with normal SPEP and elevated light chains in keeping with CKD.  Ferritin 74/iron saturation 28%, normal folate, normal B12, normal copper; methylmalonic acid mildly elevated at 419.  LDH elevated at 341, but with normal reticulocytes, haptoglobin, and negative DAT.  Normal TSH. -- Hospitalized from 06/12/2021 through 06/23/2021 for acute on chronic renal failure and anemia of chronic disease.  During her hospitalization, her Hgb was down to 6.4.  She was transfused 2 units of PRBCs and treated with IV iron and ESA therapy during hospitalization.  Hgb was  10.7 at the time of discharge. - Received Ferrlecit to 50 mg on 06/14/2021 and 06/15/2021. - EGD on 06/27/2019 with normal esophagus, status post Maloney dilation, focal abnormality in the gastric mucosa likely result of trauma (heaving).  Normal duodenal bulb and second part of duodenum. - Denies any bleeding per rectum or melena.   - Symptomatic with fatigue.  Denies any ice pica.  No B symptoms. - Most recent labs (09/15/2021): Hgb 9.6/MCV 85.2, ferritin 557/iron saturation 42%, elevated B12 1852/normal methylmalonic acid - PLAN: Recommend starting patient on ESA with Retacrit 20,000 units every 2 weeks - Repeat labs and RTC in 1 month. - If patient does not improve adequately on ESA, would recommend bone marrow biopsy.    2.  CKD stage IV - Per note by hospital nephrologist (Dr. Pearson Grippe) on 06/23/2021: Progression to CKD stage IV in the setting of diabetic nephropathy and nephrotic syndrome.  Anemia due to CKD, received ESA while hospitalized.  Recommended considering outpatient ESA therapy. - Most recent CMP (06/26/2021) with creatinine 3.08/GFR 38 - She follows with nephrology (Dr. Marval Regal)   - PLAN: Continue close nephrology follow-up  3.  B12 deficiency: - Borderline B12 deficiency noted on 05/14/2021 with normal B12 1057, but elevated methylmalonic acid 419 - Most recent labs (09/15/2021) with vitamin B12 1852 and normal methylmalonic acid at 369  - She is taking B12 supplement 1000 mcg daily - PLAN: Continue daily B12 supplement  4.  Social/family history: - Relevant PMH includes type 1 diabetes mellitus, autoimmune thyroiditis, and asthma - She lives at home with her mother.  She used to work at [employer] but is on disability at this time.  She has no exposure to chemicals.  She is non-smoker. - She uses a cane to ambulate since April last year. - No family history of sickle cell or thalassemia.  Father had pancreatic cancer.   PLAN SUMMARY & DISPOSITION: CBC + Retacrit 20,000  units every 2 weeks Office visit in 4 weeks  All questions were answered. The patient knows to call the clinic with any problems, questions or concerns.  Medical decision making: Moderate  Time spent on visit: I spent 20 minutes counseling the patient face to face. The total time spent in the appointment was 30 minutes and more than 50% was on counseling.   Harriett Rush, PA-C  09/25/2021 5:22 PM

## 2021-09-24 NOTE — Telephone Encounter (Signed)
Pre-op appt 10/15/21. Appt letter mailed with procedure instructions.

## 2021-09-25 ENCOUNTER — Inpatient Hospital Stay (HOSPITAL_BASED_OUTPATIENT_CLINIC_OR_DEPARTMENT_OTHER): Payer: 59 | Admitting: Physician Assistant

## 2021-09-25 ENCOUNTER — Encounter (HOSPITAL_COMMUNITY): Payer: Self-pay | Admitting: Physician Assistant

## 2021-09-25 ENCOUNTER — Ambulatory Visit: Payer: 59 | Admitting: Family Medicine

## 2021-09-25 ENCOUNTER — Inpatient Hospital Stay (HOSPITAL_COMMUNITY): Payer: 59

## 2021-09-25 ENCOUNTER — Other Ambulatory Visit: Payer: Self-pay | Admitting: Family Medicine

## 2021-09-25 VITALS — BP 137/87 | HR 85 | Temp 98.2°F | Resp 17 | Ht 67.0 in | Wt 142.3 lb

## 2021-09-25 DIAGNOSIS — D631 Anemia in chronic kidney disease: Secondary | ICD-10-CM

## 2021-09-25 DIAGNOSIS — F32A Depression, unspecified: Secondary | ICD-10-CM | POA: Insufficient documentation

## 2021-09-25 DIAGNOSIS — D649 Anemia, unspecified: Secondary | ICD-10-CM

## 2021-09-25 DIAGNOSIS — N184 Chronic kidney disease, stage 4 (severe): Secondary | ICD-10-CM

## 2021-09-25 HISTORY — DX: Anemia in chronic kidney disease: D63.1

## 2021-09-25 LAB — CMP14+EGFR
ALT: 24 IU/L (ref 0–32)
AST: 24 IU/L (ref 0–40)
Albumin/Globulin Ratio: 0.6 — ABNORMAL LOW (ref 1.2–2.2)
Albumin: 2.1 g/dL — ABNORMAL LOW (ref 3.9–5.0)
Alkaline Phosphatase: 385 IU/L — ABNORMAL HIGH (ref 44–121)
BUN/Creatinine Ratio: 11 (ref 9–23)
BUN: 36 mg/dL — ABNORMAL HIGH (ref 6–20)
Bilirubin Total: <0.2 mg/dL (ref 0.0–1.2)
CO2: 13 mmol/L — ABNORMAL LOW (ref 20–29)
Calcium: 7.7 mg/dL — ABNORMAL LOW (ref 8.7–10.2)
Chloride: 104 mmol/L (ref 96–106)
Creatinine, Ser: 3.17 mg/dL — ABNORMAL HIGH (ref 0.57–1.00)
Globulin, Total: 3.4 g/dL (ref 1.5–4.5)
Glucose: 287 mg/dL — ABNORMAL HIGH (ref 70–99)
Potassium: 3.4 mmol/L — ABNORMAL LOW (ref 3.5–5.2)
Sodium: 132 mmol/L — ABNORMAL LOW (ref 134–144)
Total Protein: 5.5 g/dL — ABNORMAL LOW (ref 6.0–8.5)
eGFR: 20 mL/min/{1.73_m2} — ABNORMAL LOW (ref >59)

## 2021-09-25 LAB — CBC
Hematocrit: 28.1 % — ABNORMAL LOW (ref 34.0–46.6)
Hemoglobin: 9.5 g/dL — ABNORMAL LOW (ref 11.1–15.9)
MCH: 28.2 pg (ref 26.6–33.0)
MCHC: 33.8 g/dL (ref 31.5–35.7)
MCV: 83 fL (ref 79–97)
Platelets: 248 10*3/uL (ref 150–450)
RBC: 3.37 x10E6/uL — ABNORMAL LOW (ref 3.77–5.28)
RDW: 14.3 % (ref 11.7–15.4)
WBC: 23.5 10*3/uL (ref 3.4–10.8)

## 2021-09-25 LAB — LIPID PANEL
Chol/HDL Ratio: 3.6 ratio (ref 0.0–4.4)
Cholesterol, Total: 175 mg/dL (ref 100–199)
HDL: 49 mg/dL (ref >39)
LDL Chol Calc (NIH): 85 mg/dL (ref 0–99)
Triglycerides: 250 mg/dL — ABNORMAL HIGH (ref 0–149)
VLDL Cholesterol Cal: 41 mg/dL — ABNORMAL HIGH (ref 5–40)

## 2021-09-25 LAB — HEMOGLOBIN A1C
Est. average glucose Bld gHb Est-mCnc: 235 mg/dL
Hgb A1c MFr Bld: 9.8 % — ABNORMAL HIGH (ref 4.8–5.6)

## 2021-09-25 MED ORDER — EPOETIN ALFA-EPBX 20000 UNIT/ML IJ SOLN
20000.0000 [IU] | Freq: Once | INTRAMUSCULAR | Status: AC
  Administered 2021-09-25: 20000 [IU] via SUBCUTANEOUS
  Filled 2021-09-25: qty 1

## 2021-09-25 NOTE — Assessment & Plan Note (Signed)
Renal function appears to be stable.  Advised to continue close follow-up with nephrology.

## 2021-09-25 NOTE — Patient Instructions (Signed)
[facility]  Discharge Instructions: Thank you for choosing [facility] to provide your oncology and hematology care.  If you have a lab appointment with the [facility], please come in thru the Main Entrance and check in at the main information desk.  Wear comfortable clothing and clothing appropriate for easy access to any Portacath or PICC line.   We strive to give you quality time with your provider. You may need to reschedule your appointment if you arrive late (15 or more minutes).  Arriving late affects you and other patients whose appointments are after yours.  Also, if you miss three or more appointments without notifying the office, you may be dismissed from the clinic at the provider's discretion.      For prescription refill requests, have your pharmacy contact our office and allow 72 hours for refills to be completed.    Today you received the following chemotherapy and/or immunotherapy agents Retacrit, return as scheduled.   To help prevent nausea and vomiting after your treatment, we encourage you to take your nausea medication as directed.  BELOW ARE SYMPTOMS THAT SHOULD BE REPORTED IMMEDIATELY: *FEVER GREATER THAN 100.4 F (38 C) OR HIGHER *CHILLS OR SWEATING *NAUSEA AND VOMITING THAT IS NOT CONTROLLED WITH YOUR NAUSEA MEDICATION *UNUSUAL SHORTNESS OF BREATH *UNUSUAL BRUISING OR BLEEDING *URINARY PROBLEMS (pain or burning when urinating, or frequent urination) *BOWEL PROBLEMS (unusual diarrhea, constipation, pain near the anus) TENDERNESS IN MOUTH AND THROAT WITH OR WITHOUT PRESENCE OF ULCERS (sore throat, sores in mouth, or a toothache) UNUSUAL RASH, SWELLING OR PAIN  UNUSUAL VAGINAL DISCHARGE OR ITCHING   Items with * indicate a potential emergency and should be followed up as soon as possible or go to the Emergency Department if any problems should occur.  Please show the CHEMOTHERAPY ALERT CARD or IMMUNOTHERAPY ALERT CARD at check-in to  the Emergency Department and triage nurse.  Should you have questions after your visit or need to cancel or reschedule your appointment, please contact [facility] [phone]  and follow the prompts.  Office hours are 8:00 a.m. to 4:30 p.m. Monday - Friday. Please note that voicemails left after 4:00 p.m. may not be returned until the following business day.  We are closed weekends and major holidays. You have access to a nurse at all times for urgent questions. Please call the main number to the clinic [phone] and follow the prompts.  For any non-urgent questions, you may also contact your provider using MyChart. We now offer e-Visits for anyone 19 and older to request care online for non-urgent symptoms. For details visit [email or web address].   Also download the MyChart app! Go to the app store, search "MyChart", open the app, select [facility], and log in with your MyChart username and password.  Due to Covid, a mask is required upon entering the hospital/clinic. If you do not have a mask, one will be given to you upon arrival. For doctor visits, patients may have 1 support person aged 32 or older with them. For treatment visits, patients cannot have anyone with them due to current Covid guidelines and our immunocompromised population.

## 2021-09-25 NOTE — Assessment & Plan Note (Signed)
I discussed this with the patient today.  Advised her to try and find some activities of interest.  I will continue to follow this closely.

## 2021-09-25 NOTE — Progress Notes (Signed)
Patient tolerated injection with no complaints voiced. Site clean and dry with no bruising or swelling noted at site. See MAR for details. Band aid applied.  Patient stable during and after injection. VSS with discharge and left in satisfactory condition with no s/s of distress noted.  

## 2021-09-25 NOTE — Assessment & Plan Note (Signed)
BP stable.  Continue current medications. 

## 2021-09-25 NOTE — Progress Notes (Signed)
Subjective:  Patient ID: Lindsey Murillo, female    DOB: Oct 06, 1995  Age: 26 y.o. MRN: [id number]  CC: Chief Complaint  Patient presents with   Diabetes    Follow up- seeing endocrinologist     HPI:  26 year old female with an extensive past medical history presents for follow-up.  Patient needs A1c.  She is being followed by endocrinology.  She is currently on Semglee 12 units.  Blood pressures well controlled.  She is on torsemide and metoprolol.  Needs lipid panel to assess her hyperlipidemia.  Patient continues to follow-up with nephrology given nephrotic syndrome.  Patient seems to be more motivated to be compliant and take care of herself.  She is following up regularly.    Patient endorsing symptoms of depression.  Patient finding little interest in doing things.  Patient states that she feels like she is just "here".  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   Vitamin D deficiency 03/20/2019   Uncontrolled type 1 diabetes mellitus with hyperglycemia ([facility]) 05/12/2015   Essential hypertension, benign 11/30/2012   Diabetic peripheral neuropathy ([facility] [facility])    Asthma     Social Hx   Social History   Socioeconomic History   Marital status: Single    Spouse name: Not on file   Number of children: 0   Years of education: Not on file   Highest education level: High school graduate  Occupational History   Occupation: unemployed  Tobacco Use   Smoking status: Never   Smokeless tobacco: Never  Vaping Use   Vaping Use: Never used  Substance and Sexual Activity   Alcohol use: No   Drug use: No   Sexual activity: Never     Birth control/protection: Abstinence  Other Topics Concern   Not on file  Social History Narrative   Not on file   Social Determinants of Health   Financial Resource Strain: Not on file  Food Insecurity: Not on file  Transportation Needs: Not on file  Physical Activity: Not on file  Stress: Not on file  Social Connections: Not on file    Review of Systems Per HPI  Objective:  BP 134/83   Pulse 92   Ht _0  (1.702 m)   Wt 142 lb 12.8 oz (64.8 kg)   BMI 22.37 kg/m      09/25/2021    9:48 AM 09/24/2021    1:58 PM 08/27/2021   10:06 AM  BP/Weight  Systolic BP 637 858 850  Diastolic BP 87 83 86  Wt. (Lbs) 142.3 142.8 133.4  BMI 22.29 kg/m2 22.37 kg/m2 20.89 kg/m2    Physical Exam Vitals and nursing note reviewed.  Constitutional:      General: She is not in acute distress. HENT:     Head: Normocephalic and atraumatic.  Eyes:     General:        Right eye: No discharge.        Left eye: No discharge.     Conjunctiva/sclera: Conjunctivae normal.  Cardiovascular:     Rate and Rhythm: Normal rate and regular rhythm.     Comments: Lower extremity edema. Pulmonary:     Effort: Pulmonary effort is normal.  Breath sounds: Normal breath sounds. No wheezing or rales.  Neurological:     Mental Status: She is alert.  Psychiatric:     Comments: Flat affect.  Depressed mood.    Lab Results  Component Value Date   WBC 23.5 (HH) 09/24/2021   HGB 9.5 (L) 09/24/2021   HCT 28.1 (L) 09/24/2021   PLT 248 09/24/2021   GLUCOSE 287 (H) 09/24/2021   CHOL 175 09/24/2021   TRIG 250 (H) 09/24/2021   HDL 49 09/24/2021   LDLCALC 85 09/24/2021   ALT 24 09/24/2021   AST 24 09/24/2021   NA 132 (L) 09/24/2021   K 3.4 (L) 09/24/2021   CL 104 09/24/2021   CREATININE 3.17 (H) 09/24/2021   BUN 36 (H) 09/24/2021   CO2 13 (L) 09/24/2021   TSH 2.520 05/26/2021   INR 0.8 05/20/2021   HGBA1C 9.8 (H) 09/24/2021   MICROALBUR 1,195.2 (H) 06/13/2021     Assessment & Plan:    Problem List Items Addressed This Visit       Cardiovascular and Mediastinum   Essential hypertension, benign    BP stable.  Continue current medications.         Endocrine   Uncontrolled type 1 diabetes mellitus with hyperglycemia ([name]) - Primary    A1c has returned and is elevated significantly.  Patient needs close follow-up with endocrinology.  Continue current dosing of insulin.       Relevant Orders   CMP14+EGFR (Completed)   Lipid panel (Completed)   Hemoglobin A1c (Completed)     Genitourinary   Nephrotic syndrome    Renal function appears to be stable.  Advised to continue close follow-up with nephrology.         Other   Depression    I discussed this with the patient today.  Advised her to try and find some activities of interest.  I will continue to follow this closely.       Normocytic anemia   Relevant Orders   CBC (Completed)    Follow-up:  Return in about 3 months (around 12/25/2021).  [place]

## 2021-09-25 NOTE — Assessment & Plan Note (Signed)
A1c has returned and is elevated significantly.  Patient needs close follow-up with endocrinology.  Continue current dosing of insulin.

## 2021-09-25 NOTE — Patient Instructions (Addendum)
[place] [place] at [facility] Discharge Instructions  You were seen today by Tarri Abernethy PA-C for your anemia.  We suspect that your anemia is related to your chronic kidney disease. - We are going to start you on Retacrit injections.  These injections will help your body to make more blood cells.  We will check your labs and give you these injections every 2 weeks. - These injections may cause increased blood pressure or increased risk of blood clots.  Please read the attached handout for additional information about side effects. - Continue to take your B12 supplement at least every other day.  FOLLOW-UP APPOINTMENT: Office visit in 1 month   Thank you for choosing [facility] at [facility] to provide your oncology and hematology care.  To afford each patient quality time with our provider, please arrive at least 15 minutes before your scheduled appointment time.   If you have a lab appointment with the [facility] please come in thru the Main Entrance and check in at the main information desk.  You need to re-schedule your appointment should you arrive 10 or more minutes late.  We strive to give you quality time with our providers, and arriving late affects you and other patients whose appointments are after yours.  Also, if you no show three or more times for appointments you may be dismissed from the clinic at the providers discretion.     Again, thank you for choosing [facility].  Our hope is that these requests will decrease the amount of time that you wait before being seen by our physicians.       _____________________________________________________________  Should you have questions after your visit to [facility], please contact our office at [phone] and follow the prompts.  Our office hours are 8:00 a.m. and 4:30 p.m. Monday - Friday.  Please note that voicemails left after 4:00 p.m. may not be  returned until the following business day.  We are closed weekends and major holidays.  You do have access to a nurse 24-7, just call the main number to the clinic [phone] and do not press any options, hold on the line and a nurse will answer the phone.    For prescription refill requests, have your pharmacy contact our office and allow 72 hours.    Due to Covid, you will need to wear a mask upon entering the hospital. If you do not have a mask, a mask will be given to you at the Main Entrance upon arrival. For doctor visits, patients may have 1 support person age 76 or older with them. For treatment visits, patients can not have anyone with them due to social distancing guidelines and our immunocompromised population.

## 2021-09-28 ENCOUNTER — Ambulatory Visit: Payer: 59 | Admitting: "Endocrinology

## 2021-10-01 ENCOUNTER — Other Ambulatory Visit: Payer: Self-pay

## 2021-10-01 ENCOUNTER — Emergency Department (HOSPITAL_COMMUNITY): Payer: 59

## 2021-10-01 ENCOUNTER — Inpatient Hospital Stay (HOSPITAL_COMMUNITY)
Admission: EM | Admit: 2021-10-01 | Discharge: 2021-10-22 | DRG: 871 | Disposition: A | Discharge status: Home Health | Payer: 59 | Attending: Internal Medicine | Admitting: Internal Medicine

## 2021-10-01 ENCOUNTER — Encounter (HOSPITAL_COMMUNITY): Payer: Self-pay

## 2021-10-01 DIAGNOSIS — J45909 Unspecified asthma, uncomplicated: Secondary | ICD-10-CM | POA: Diagnosis present

## 2021-10-01 DIAGNOSIS — E43 Unspecified severe protein-calorie malnutrition: Secondary | ICD-10-CM | POA: Diagnosis present

## 2021-10-01 DIAGNOSIS — E876 Hypokalemia: Secondary | ICD-10-CM | POA: Diagnosis present

## 2021-10-01 DIAGNOSIS — E10649 Type 1 diabetes mellitus with hypoglycemia without coma: Secondary | ICD-10-CM | POA: Diagnosis not present

## 2021-10-01 DIAGNOSIS — D631 Anemia in chronic kidney disease: Secondary | ICD-10-CM | POA: Diagnosis present

## 2021-10-01 DIAGNOSIS — N184 Chronic kidney disease, stage 4 (severe): Secondary | ICD-10-CM

## 2021-10-01 DIAGNOSIS — R6510 Systemic inflammatory response syndrome (SIRS) of non-infectious origin without acute organ dysfunction: Principal | ICD-10-CM

## 2021-10-01 DIAGNOSIS — Z8349 Family history of other endocrine, nutritional and metabolic diseases: Secondary | ICD-10-CM

## 2021-10-01 DIAGNOSIS — N61 Mastitis without abscess: Secondary | ICD-10-CM | POA: Diagnosis present

## 2021-10-01 DIAGNOSIS — S025XXA Fracture of tooth (traumatic), initial encounter for closed fracture: Secondary | ICD-10-CM | POA: Diagnosis present

## 2021-10-01 DIAGNOSIS — N179 Acute kidney failure, unspecified: Secondary | ICD-10-CM | POA: Diagnosis present

## 2021-10-01 DIAGNOSIS — Z833 Family history of diabetes mellitus: Secondary | ICD-10-CM

## 2021-10-01 DIAGNOSIS — R339 Retention of urine, unspecified: Secondary | ICD-10-CM | POA: Diagnosis present

## 2021-10-01 DIAGNOSIS — A419 Sepsis, unspecified organism: Secondary | ICD-10-CM | POA: Diagnosis not present

## 2021-10-01 DIAGNOSIS — N250 Renal osteodystrophy: Secondary | ICD-10-CM | POA: Diagnosis present

## 2021-10-01 DIAGNOSIS — D509 Iron deficiency anemia, unspecified: Secondary | ICD-10-CM | POA: Diagnosis present

## 2021-10-01 DIAGNOSIS — E1065 Type 1 diabetes mellitus with hyperglycemia: Secondary | ICD-10-CM | POA: Diagnosis present

## 2021-10-01 DIAGNOSIS — E871 Hypo-osmolality and hyponatremia: Secondary | ICD-10-CM | POA: Diagnosis present

## 2021-10-01 DIAGNOSIS — I120 Hypertensive chronic kidney disease with stage 5 chronic kidney disease or end stage renal disease: Secondary | ICD-10-CM | POA: Diagnosis present

## 2021-10-01 DIAGNOSIS — T82838A Hemorrhage of vascular prosthetic devices, implants and grafts, initial encounter: Secondary | ICD-10-CM | POA: Diagnosis not present

## 2021-10-01 DIAGNOSIS — Z681 Body mass index (BMI) 19 or less, adult: Secondary | ICD-10-CM

## 2021-10-01 DIAGNOSIS — N2581 Secondary hyperparathyroidism of renal origin: Secondary | ICD-10-CM | POA: Diagnosis present

## 2021-10-01 DIAGNOSIS — E785 Hyperlipidemia, unspecified: Secondary | ICD-10-CM | POA: Diagnosis present

## 2021-10-01 DIAGNOSIS — A4101 Sepsis due to Methicillin susceptible Staphylococcus aureus: Principal | ICD-10-CM | POA: Diagnosis present

## 2021-10-01 DIAGNOSIS — N049 Nephrotic syndrome with unspecified morphologic changes: Secondary | ICD-10-CM

## 2021-10-01 DIAGNOSIS — E8770 Fluid overload, unspecified: Secondary | ICD-10-CM | POA: Diagnosis present

## 2021-10-01 DIAGNOSIS — L03221 Cellulitis of neck: Secondary | ICD-10-CM | POA: Diagnosis present

## 2021-10-01 DIAGNOSIS — R600 Localized edema: Secondary | ICD-10-CM | POA: Diagnosis present

## 2021-10-01 DIAGNOSIS — R5381 Other malaise: Secondary | ICD-10-CM

## 2021-10-01 DIAGNOSIS — Z79899 Other long term (current) drug therapy: Secondary | ICD-10-CM

## 2021-10-01 DIAGNOSIS — R609 Edema, unspecified: Secondary | ICD-10-CM | POA: Diagnosis present

## 2021-10-01 DIAGNOSIS — Y828 Other medical devices associated with adverse incidents: Secondary | ICD-10-CM | POA: Diagnosis not present

## 2021-10-01 DIAGNOSIS — L0211 Cutaneous abscess of neck: Secondary | ICD-10-CM | POA: Diagnosis present

## 2021-10-01 DIAGNOSIS — E872 Acidosis, unspecified: Secondary | ICD-10-CM | POA: Diagnosis present

## 2021-10-01 DIAGNOSIS — N611 Abscess of the breast and nipple: Secondary | ICD-10-CM | POA: Diagnosis present

## 2021-10-01 DIAGNOSIS — R6520 Severe sepsis without septic shock: Secondary | ICD-10-CM | POA: Diagnosis present

## 2021-10-01 DIAGNOSIS — Z794 Long term (current) use of insulin: Secondary | ICD-10-CM

## 2021-10-01 DIAGNOSIS — R8281 Pyuria: Secondary | ICD-10-CM

## 2021-10-01 DIAGNOSIS — E1043 Type 1 diabetes mellitus with diabetic autonomic (poly)neuropathy: Secondary | ICD-10-CM | POA: Diagnosis present

## 2021-10-01 DIAGNOSIS — X58XXXA Exposure to other specified factors, initial encounter: Secondary | ICD-10-CM | POA: Diagnosis present

## 2021-10-01 DIAGNOSIS — E1022 Type 1 diabetes mellitus with diabetic chronic kidney disease: Secondary | ICD-10-CM | POA: Diagnosis present

## 2021-10-01 DIAGNOSIS — E1042 Type 1 diabetes mellitus with diabetic polyneuropathy: Secondary | ICD-10-CM | POA: Diagnosis present

## 2021-10-01 DIAGNOSIS — N186 End stage renal disease: Secondary | ICD-10-CM | POA: Diagnosis present

## 2021-10-01 LAB — CBC
HCT: 26.2 % — ABNORMAL LOW (ref 36.0–46.0)
Hemoglobin: 8.6 g/dL — ABNORMAL LOW (ref 12.0–15.0)
MCH: 28.1 pg (ref 26.0–34.0)
MCHC: 32.8 g/dL (ref 30.0–36.0)
MCV: 85.6 fL (ref 80.0–100.0)
Platelets: 330 10*3/uL (ref 150–400)
RBC: 3.06 MIL/uL — ABNORMAL LOW (ref 3.87–5.11)
RDW: 15.7 % — ABNORMAL HIGH (ref 11.5–15.5)
WBC: 38.9 10*3/uL — ABNORMAL HIGH (ref 4.0–10.5)
nRBC: 0.1 % (ref 0.0–0.2)

## 2021-10-01 LAB — PROTIME-INR
INR: 1.2 (ref 0.8–1.2)
Prothrombin Time: 15.3 seconds — ABNORMAL HIGH (ref 11.4–15.2)

## 2021-10-01 LAB — BASIC METABOLIC PANEL
Anion gap: 9 (ref 5–15)
BUN: 66 mg/dL — ABNORMAL HIGH (ref 6–20)
CO2: 14 mmol/L — ABNORMAL LOW (ref 22–32)
Calcium: 7.7 mg/dL — ABNORMAL LOW (ref 8.9–10.3)
Chloride: 107 mmol/L (ref 98–111)
Creatinine, Ser: 4.76 mg/dL — ABNORMAL HIGH (ref 0.44–1.00)
GFR, Estimated: 12 mL/min — ABNORMAL LOW (ref >60)
Glucose, Bld: 179 mg/dL — ABNORMAL HIGH (ref 70–99)
Potassium: 2.7 mmol/L — CL (ref 3.5–5.1)
Sodium: 130 mmol/L — ABNORMAL LOW (ref 135–145)

## 2021-10-01 LAB — BRAIN NATRIURETIC PEPTIDE: B Natriuretic Peptide: 626 pg/mL — ABNORMAL HIGH (ref 0.0–100.0)

## 2021-10-01 LAB — CBG MONITORING, ED: Glucose-Capillary: 217 mg/dL — ABNORMAL HIGH (ref 70–99)

## 2021-10-01 LAB — LACTIC ACID, PLASMA: Lactic Acid, Venous: 0.7 mmol/L (ref 0.5–1.9)

## 2021-10-01 MED ORDER — METRONIDAZOLE 500 MG/100ML IV SOLN
500.0000 mg | Freq: Once | INTRAVENOUS | Status: AC
  Administered 2021-10-02: 500 mg via INTRAVENOUS
  Filled 2021-10-01: qty 100

## 2021-10-01 MED ORDER — SODIUM CHLORIDE 0.9 % IV SOLN
2.0000 g | Freq: Once | INTRAVENOUS | Status: AC
  Administered 2021-10-02: 2 g via INTRAVENOUS
  Filled 2021-10-01: qty 12.5

## 2021-10-01 MED ORDER — LACTATED RINGERS IV SOLN: INTRAVENOUS | Status: DC

## 2021-10-01 MED ORDER — VANCOMYCIN HCL IN DEXTROSE 1-5 GM/200ML-% IV SOLN
1000.0000 mg | Freq: Once | INTRAVENOUS | Status: AC
  Administered 2021-10-02: 1000 mg via INTRAVENOUS
  Filled 2021-10-01: qty 200

## 2021-10-01 NOTE — ED Provider Notes (Signed)
[facility] EMERGENCY DEPARTMENT Provider Note   CSN: [id number] Arrival date & time: 10/01/21  1911     History {Add pertinent medical, surgical, social history, OB history to HPI:1} Chief Complaint  Patient presents with  . Leg Swelling    X 2 weeks     Lindsey Murillo is a 26 y.o. female.  Presents to the emergency department because she has not been feeling well for several days.  Patient has a history of stage IV chronic kidney disease, diabetes.  She has been experiencing increased swelling of her legs with shortness of breath.      Home Medications Prior to Admission medications   Medication Sig Start Date End Date Taking? Authorizing Provider  acetaminophen (TYLENOL) 325 MG tablet Take 650 mg by mouth every 6 (six) hours as needed (for pain).    [provider]  blood glucose meter kit and supplies KIT Dispense based on patient and insurance preference. Use up to four times daily as directed. 05/26/21   Coral Spikes, DO  calcium acetate, Phos Binder, (PHOSLYRA) 667 MG/5ML SOLN Take by mouth 3 (three) times daily with meals.    [provider]  Continuous Blood Gluc Receiver (FREESTYLE LIBRE 2 READER) DEVI As directed 06/02/21   Cassandria Anger, MD  Continuous Blood Gluc Sensor (FREESTYLE LIBRE 2 SENSOR) MISC 1 Piece by Does not apply route every 14 (fourteen) days. 06/02/21   Cassandria Anger, MD  feeding supplement (ENSURE ENLIVE / ENSURE PLUS) LIQD Take 237 mLs by mouth 2 (two) times daily between meals. 06/23/21   Orson Eva, MD  insulin glargine-yfgn (SEMGLEE) 100 UNIT/ML injection 12 Units daily. 05/26/21   [provider]  Insulin Pen Needle 31G X 4 MM MISC Use as directed to administer insulin. 05/26/21   Coral Spikes, DO  metoprolol tartrate (LOPRESSOR) 50 MG tablet Take 50 mg by mouth 2 (two) times daily.    [provider]  mupirocin ointment (BACTROBAN) 2 % Apply topically 2 (two) times daily. 08/11/21   [provider]  polyethylene glycol-electrolytes (TRILYTE) 420 g solution Take 4,000 mLs by mouth as directed. 09/24/21   Eloise Harman, DO  potassium chloride (KLOR-CON) 20 MEQ packet Take 40 mEq by mouth daily. 06/23/21   Orson Eva, MD  rosuvastatin (CRESTOR) 10 MG tablet Take 1 tablet (10 mg total) by mouth daily. 06/01/21   Coral Spikes, DO  sulfamethoxazole-trimethoprim (BACTRIM DS) 800-160 MG tablet Take 1 tablet by mouth 2 (two) times daily. 08/11/21   [provider]  torsemide (DEMADEX) 20 MG tablet Take 60 mg by mouth 2 (two) times daily. 07/06/21   [provider]  vitamin B-12 (CYANOCOBALAMIN) 1000 MCG tablet Take 1,000 mcg by mouth daily.    [provider]      Allergies    Patient has no known allergies.    Review of Systems   Review of Systems  Physical Exam Updated Vital Signs BP 119/84 (BP Location: Right Arm)   Pulse (!) 109   Temp 98.4 F (36.9 C) (Oral)   Resp 16   Ht 5' 7"  (1.702 m)   Wt 64.5 kg   SpO2 100%   BMI 22.29 kg/m  Physical Exam Vitals and nursing note reviewed.  Constitutional:      General: She is not in acute distress.    Appearance: She is well-developed.  HENT:     Head: Normocephalic and atraumatic.     Mouth/Throat:  Mouth: Mucous membranes are moist.  Eyes:     General: Vision grossly intact. Gaze aligned appropriately.     Extraocular Movements: Extraocular movements intact.     Conjunctiva/sclera: Conjunctivae normal.  Cardiovascular:     Rate and Rhythm: Normal rate and regular rhythm.     Pulses: Normal pulses.     Heart sounds: Normal heart sounds, S1 normal and S2 normal. No murmur heard.   No friction rub. No gallop.  Pulmonary:     Effort: Pulmonary effort is normal. No respiratory distress.     Breath sounds: Normal breath sounds.  Abdominal:     General: Bowel sounds are normal.     Palpations: Abdomen is soft.     Tenderness: There is no abdominal tenderness. There is no guarding or  rebound.     Hernia: No hernia is present.  Musculoskeletal:        General: No swelling.     Cervical back: Full passive range of motion without pain, normal range of motion and neck supple. No spinous process tenderness or muscular tenderness. Normal range of motion.     Right lower leg: 3+ Edema present.     Left lower leg: 3+ Edema present.  Skin:    General: Skin is warm and dry.     Capillary Refill: Capillary refill takes less than 2 seconds.     Findings: Rash (multiple scabs on upper extremities ("from nails")) present. No ecchymosis, erythema or wound.  Neurological:     General: No focal deficit present.     Mental Status: She is alert and oriented to person, place, and time.     GCS: GCS eye subscore is 4. GCS verbal subscore is 5. GCS motor subscore is 6.     Cranial Nerves: Cranial nerves 2-12 are intact.     Sensory: Sensation is intact.     Motor: Motor function is intact.     Coordination: Coordination is intact.  Psychiatric:        Attention and Perception: Attention normal.        Mood and Affect: Mood normal.        Speech: Speech normal.        Behavior: Behavior normal.    ED Results / Procedures / Treatments   Labs (all labs ordered are listed, but only abnormal results are displayed) Labs Reviewed  CBC - Abnormal; Notable for the following components:      Result Value   WBC 38.9 (*)    RBC 3.06 (*)    Hemoglobin 8.6 (*)    HCT 26.2 (*)    RDW 15.7 (*)    All other components within normal limits  BASIC METABOLIC PANEL - Abnormal; Notable for the following components:   Sodium 130 (*)    Potassium 2.7 (*)    CO2 14 (*)    Glucose, Bld 179 (*)    BUN 66 (*)    Creatinine, Ser 4.76 (*)    Calcium 7.7 (*)    GFR, Estimated 12 (*)    All other components within normal limits  BRAIN NATRIURETIC PEPTIDE - Abnormal; Notable for the following components:   B Natriuretic Peptide 626.0 (*)    All other components within normal limits  PROTIME-INR -  Abnormal; Notable for the following components:   Prothrombin Time 15.3 (*)    All other components within normal limits  CBG MONITORING, ED - Abnormal; Notable for the following components:   Glucose-Capillary 217 (*)  All other components within normal limits  CULTURE, BLOOD (ROUTINE X 2)  CULTURE, BLOOD (ROUTINE X 2)  LACTIC ACID, PLASMA  LACTIC ACID, PLASMA  URINALYSIS, ROUTINE W REFLEX MICROSCOPIC  PREGNANCY, URINE    EKG None  Radiology DG Chest Portable 1 View  Result Date: 10/01/2021 CLINICAL DATA:  Suspected sepsis EXAM: PORTABLE CHEST 1 VIEW COMPARISON:  10/27/2019 FINDINGS: Cardiomegaly. No confluent airspace opacities or effusions. No acute bony abnormality. IMPRESSION: Cardiomegaly.  No active disease. Electronically Signed   By: Rolm Baptise M.D.   On: 10/01/2021 22:02    Procedures Procedures  {Document cardiac monitor, telemetry assessment procedure when appropriate:1}  Medications Ordered in ED Medications - No data to display  ED Course/ Medical Decision Making/ A&P                           Medical Decision Making Amount and/or Complexity of Data Reviewed Labs: ordered. Radiology: ordered.  Risk Prescription drug management.   Patient presents to the emergency department for evaluation of leg swelling.  Differential diagnosis considered includes worsening renal failure, congestive heart failure, DVT, cellulitis and sepsis.  Patient afebrile at arrival but noted to be tachycardic.  She has normal oxygen saturation.  Lungs are clear.  Patient has significant and diffuse swelling of both legs.  This appears to be multifactorial.  She has severe hypoalbuminemia well as worsening kidney failure.  I do not see any obvious source of infection but there is concern for sepsis with her tachycardia and severe leukocytosis.  Reviewing her records reveals that she did receive erythropoietin injection on May 19 and IV iron for her chronic iron deficiency anemia but  this should not cause this significant leukocytosis.  She has not on steroids.  Patient covered with broad-spectrum antibiotics, cultures pending.  Will admit to hospital for further management.  {Document critical care time when appropriate:1} {Document review of labs and clinical decision tools ie heart score, Chads2Vasc2 etc:1}  {Document your independent review of radiology images, and any outside records:1} {Document your discussion with family members, caretakers, and with consultants:1} {Document social determinants of health affecting pt's care:1} {Document your decision making why or why not admission, treatments were needed:1} Final Clinical Impression(s) / ED Diagnoses Final diagnoses:  None    Rx / DC Orders ED Discharge Orders     None

## 2021-10-01 NOTE — ED Provider Triage Note (Signed)
Emergency Medicine Provider Triage Evaluation Note  Lindsey Murillo , a 26 y.o. female  was evaluated in triage.  Pt complains of general feeling of "unwell".  Patient also complains of increasing lower extremity edema bilaterally over 2 weeks.  Patient has issues with uncontrolled type 1 diabetes, diabetic peripheral neuropathy, chronic pancreatitis, iron deficiency anemia, peripheral edema, anemia and stage IV chronic kidney disease, and possible lymphedema. Patient denies shortness of breath, chest pain, abdominal pain, nausea, vomiting.  Review of Systems  Positive: Bilateral lower extremity edema, Negative: Chest pain, shortness of breath, abdominal pain, nausea, vomiting  Physical Exam  BP 119/84 (BP Location: Right Arm)   Pulse (!) 109   Temp 98.4 F (36.9 C) (Oral)   Resp 16   Ht 5\' 7"  (1.702 m)   Wt 64.5 kg   SpO2 100%   BMI 22.29 kg/m  Gen:   Awake, no distress   Resp:  Normal effort  MSK:   Moves extremities without difficulty  Other:  +2 pitting edema lower extremities bilaterally  Medical Decision Making  Medically screening exam initiated at 10:53 PM.  Appropriate orders placed.  Aziyah R Iezzi was informed that the remainder of the evaluation will be completed by another provider, this initial triage assessment does not replace that evaluation, and the importance of remaining in the ED until their evaluation is complete.     Dorothyann Peng, PA-C 10/01/21 2255

## 2021-10-01 NOTE — ED Triage Notes (Signed)
Pt presents to ED with c/o not feeling well and bilateral LE edema x 2 weeks. Pt mom is with her and reports she is ger legal guardian. Pt saw primary Dr last week and also has "issues with blood" and saw Dr on 3rd floor last week as well.

## 2021-10-02 ENCOUNTER — Encounter (HOSPITAL_COMMUNITY): Payer: Self-pay | Admitting: Family Medicine

## 2021-10-02 ENCOUNTER — Observation Stay (HOSPITAL_COMMUNITY): Payer: 59

## 2021-10-02 DIAGNOSIS — E872 Acidosis, unspecified: Secondary | ICD-10-CM | POA: Diagnosis present

## 2021-10-02 DIAGNOSIS — Z79899 Other long term (current) drug therapy: Secondary | ICD-10-CM | POA: Diagnosis not present

## 2021-10-02 DIAGNOSIS — A419 Sepsis, unspecified organism: Secondary | ICD-10-CM

## 2021-10-02 DIAGNOSIS — E43 Unspecified severe protein-calorie malnutrition: Secondary | ICD-10-CM | POA: Diagnosis not present

## 2021-10-02 DIAGNOSIS — N184 Chronic kidney disease, stage 4 (severe): Secondary | ICD-10-CM

## 2021-10-02 DIAGNOSIS — L0211 Cutaneous abscess of neck: Secondary | ICD-10-CM | POA: Diagnosis not present

## 2021-10-02 DIAGNOSIS — T82838A Hemorrhage of vascular prosthetic devices, implants and grafts, initial encounter: Secondary | ICD-10-CM | POA: Diagnosis not present

## 2021-10-02 DIAGNOSIS — R6510 Systemic inflammatory response syndrome (SIRS) of non-infectious origin without acute organ dysfunction: Secondary | ICD-10-CM | POA: Insufficient documentation

## 2021-10-02 DIAGNOSIS — L03221 Cellulitis of neck: Secondary | ICD-10-CM | POA: Diagnosis present

## 2021-10-02 DIAGNOSIS — E1042 Type 1 diabetes mellitus with diabetic polyneuropathy: Secondary | ICD-10-CM | POA: Diagnosis not present

## 2021-10-02 DIAGNOSIS — Z833 Family history of diabetes mellitus: Secondary | ICD-10-CM | POA: Diagnosis not present

## 2021-10-02 DIAGNOSIS — E10649 Type 1 diabetes mellitus with hypoglycemia without coma: Secondary | ICD-10-CM | POA: Diagnosis not present

## 2021-10-02 DIAGNOSIS — Z8349 Family history of other endocrine, nutritional and metabolic diseases: Secondary | ICD-10-CM | POA: Diagnosis not present

## 2021-10-02 DIAGNOSIS — N61 Mastitis without abscess: Secondary | ICD-10-CM | POA: Diagnosis not present

## 2021-10-02 DIAGNOSIS — X58XXXA Exposure to other specified factors, initial encounter: Secondary | ICD-10-CM | POA: Diagnosis present

## 2021-10-02 DIAGNOSIS — R609 Edema, unspecified: Secondary | ICD-10-CM

## 2021-10-02 DIAGNOSIS — E871 Hypo-osmolality and hyponatremia: Secondary | ICD-10-CM | POA: Diagnosis not present

## 2021-10-02 DIAGNOSIS — E876 Hypokalemia: Secondary | ICD-10-CM

## 2021-10-02 DIAGNOSIS — E1065 Type 1 diabetes mellitus with hyperglycemia: Secondary | ICD-10-CM | POA: Diagnosis present

## 2021-10-02 DIAGNOSIS — D631 Anemia in chronic kidney disease: Secondary | ICD-10-CM | POA: Diagnosis not present

## 2021-10-02 DIAGNOSIS — R6520 Severe sepsis without septic shock: Secondary | ICD-10-CM

## 2021-10-02 DIAGNOSIS — N179 Acute kidney failure, unspecified: Secondary | ICD-10-CM | POA: Diagnosis not present

## 2021-10-02 DIAGNOSIS — I10 Essential (primary) hypertension: Secondary | ICD-10-CM | POA: Diagnosis not present

## 2021-10-02 DIAGNOSIS — N289 Disorder of kidney and ureter, unspecified: Secondary | ICD-10-CM | POA: Diagnosis not present

## 2021-10-02 DIAGNOSIS — N611 Abscess of the breast and nipple: Secondary | ICD-10-CM | POA: Diagnosis not present

## 2021-10-02 DIAGNOSIS — J45909 Unspecified asthma, uncomplicated: Secondary | ICD-10-CM | POA: Diagnosis not present

## 2021-10-02 DIAGNOSIS — D649 Anemia, unspecified: Secondary | ICD-10-CM | POA: Diagnosis not present

## 2021-10-02 DIAGNOSIS — Z681 Body mass index (BMI) 19 or less, adult: Secondary | ICD-10-CM | POA: Diagnosis not present

## 2021-10-02 DIAGNOSIS — E1043 Type 1 diabetes mellitus with diabetic autonomic (poly)neuropathy: Secondary | ICD-10-CM | POA: Diagnosis not present

## 2021-10-02 DIAGNOSIS — Y828 Other medical devices associated with adverse incidents: Secondary | ICD-10-CM | POA: Diagnosis not present

## 2021-10-02 DIAGNOSIS — A4101 Sepsis due to Methicillin susceptible Staphylococcus aureus: Secondary | ICD-10-CM | POA: Diagnosis not present

## 2021-10-02 DIAGNOSIS — Z794 Long term (current) use of insulin: Secondary | ICD-10-CM | POA: Diagnosis not present

## 2021-10-02 DIAGNOSIS — N186 End stage renal disease: Secondary | ICD-10-CM | POA: Diagnosis not present

## 2021-10-02 DIAGNOSIS — N2581 Secondary hyperparathyroidism of renal origin: Secondary | ICD-10-CM | POA: Diagnosis present

## 2021-10-02 DIAGNOSIS — I120 Hypertensive chronic kidney disease with stage 5 chronic kidney disease or end stage renal disease: Secondary | ICD-10-CM | POA: Diagnosis not present

## 2021-10-02 LAB — URINALYSIS, ROUTINE W REFLEX MICROSCOPIC
Bilirubin Urine: NEGATIVE
Glucose, UA: 50 mg/dL — AB
Ketones, ur: NEGATIVE mg/dL
Nitrite: NEGATIVE
Protein, ur: 100 mg/dL — AB
Specific Gravity, Urine: 1.011 (ref 1.005–1.030)
pH: 5 (ref 5.0–8.0)

## 2021-10-02 LAB — PROCALCITONIN: Procalcitonin: 1.85 ng/mL

## 2021-10-02 LAB — GLUCOSE, CAPILLARY
Glucose-Capillary: 116 mg/dL — ABNORMAL HIGH (ref 70–99)
Glucose-Capillary: 117 mg/dL — ABNORMAL HIGH (ref 70–99)
Glucose-Capillary: 123 mg/dL — ABNORMAL HIGH (ref 70–99)
Glucose-Capillary: 44 mg/dL — CL (ref 70–99)
Glucose-Capillary: 48 mg/dL — ABNORMAL LOW (ref 70–99)
Glucose-Capillary: 57 mg/dL — ABNORMAL LOW (ref 70–99)
Glucose-Capillary: 71 mg/dL (ref 70–99)
Glucose-Capillary: 84 mg/dL (ref 70–99)

## 2021-10-02 LAB — HEPATIC FUNCTION PANEL
ALT: 19 U/L (ref 0–44)
AST: 13 U/L — ABNORMAL LOW (ref 15–41)
Albumin: 1.5 g/dL — ABNORMAL LOW (ref 3.5–5.0)
Alkaline Phosphatase: 310 U/L — ABNORMAL HIGH (ref 38–126)
Bilirubin, Direct: 0.1 mg/dL (ref 0.0–0.2)
Indirect Bilirubin: 0.3 mg/dL (ref 0.3–0.9)
Total Bilirubin: 0.4 mg/dL (ref 0.3–1.2)
Total Protein: 5.4 g/dL — ABNORMAL LOW (ref 6.5–8.1)

## 2021-10-02 LAB — PREGNANCY, URINE: Preg Test, Ur: NEGATIVE

## 2021-10-02 LAB — LACTIC ACID, PLASMA: Lactic Acid, Venous: 0.7 mmol/L (ref 0.5–1.9)

## 2021-10-02 MED ORDER — ONDANSETRON HCL 4 MG/2ML IJ SOLN
4.0000 mg | Freq: Four times a day (QID) | INTRAMUSCULAR | Status: DC | PRN
  Administered 2021-10-03 – 2021-10-21 (×3): 4 mg via INTRAVENOUS
  Filled 2021-10-02 (×4): qty 2

## 2021-10-02 MED ORDER — GLUCOSE 40 % PO GEL: 1.0000 | ORAL | Status: AC

## 2021-10-02 MED ORDER — HEPARIN SODIUM (PORCINE) 5000 UNIT/ML IJ SOLN
5000.0000 [IU] | Freq: Three times a day (TID) | INTRAMUSCULAR | Status: DC
  Administered 2021-10-02 – 2021-10-22 (×55): 5000 [IU] via SUBCUTANEOUS
  Filled 2021-10-02 (×56): qty 1

## 2021-10-02 MED ORDER — TORSEMIDE 20 MG PO TABS
60.0000 mg | ORAL_TABLET | Freq: Two times a day (BID) | ORAL | Status: DC
  Administered 2021-10-02 – 2021-10-04 (×6): 60 mg via ORAL
  Filled 2021-10-02 (×7): qty 3

## 2021-10-02 MED ORDER — SODIUM CHLORIDE 0.9 % IV SOLN
2.0000 g | INTRAVENOUS | Status: DC
  Administered 2021-10-02 – 2021-10-03 (×2): 2 g via INTRAVENOUS
  Filled 2021-10-02 (×2): qty 12.5

## 2021-10-02 MED ORDER — CALCIUM ACETATE (PHOS BINDER) 667 MG PO CAPS
667.0000 mg | ORAL_CAPSULE | Freq: Three times a day (TID) | ORAL | Status: DC
  Administered 2021-10-02 – 2021-10-20 (×49): 667 mg via ORAL
  Filled 2021-10-02 (×52): qty 1

## 2021-10-02 MED ORDER — OXYCODONE HCL 5 MG PO TABS
5.0000 mg | ORAL_TABLET | ORAL | Status: DC | PRN
  Administered 2021-10-02 – 2021-10-04 (×5): 5 mg via ORAL
  Filled 2021-10-02 (×5): qty 1

## 2021-10-02 MED ORDER — VANCOMYCIN HCL 750 MG/150ML IV SOLN: 750.0000 mg | INTRAVENOUS | Status: DC

## 2021-10-02 MED ORDER — POTASSIUM CHLORIDE 20 MEQ PO PACK
40.0000 meq | PACK | Freq: Once | ORAL | Status: AC
  Administered 2021-10-03: 40 meq via ORAL

## 2021-10-02 MED ORDER — ACETAMINOPHEN 325 MG PO TABS: 650.0000 mg | ORAL_TABLET | Freq: Four times a day (QID) | ORAL | Status: DC | PRN

## 2021-10-02 MED ORDER — INSULIN GLARGINE-YFGN 100 UNIT/ML ~~LOC~~ SOLN
12.0000 [IU] | Freq: Every day | SUBCUTANEOUS | Status: DC
  Administered 2021-10-02: 12 [IU] via SUBCUTANEOUS
  Filled 2021-10-02 (×2): qty 0.12

## 2021-10-02 MED ORDER — ALBUMIN HUMAN 25 % IV SOLN
12.5000 g | Freq: Four times a day (QID) | INTRAVENOUS | Status: AC
  Administered 2021-10-02 (×3): 12.5 g via INTRAVENOUS
  Filled 2021-10-02 (×3): qty 50

## 2021-10-02 MED ORDER — LINEZOLID 600 MG/300ML IV SOLN
600.0000 mg | Freq: Two times a day (BID) | INTRAVENOUS | Status: DC
  Administered 2021-10-03 – 2021-10-06 (×6): 600 mg via INTRAVENOUS
  Filled 2021-10-02 (×9): qty 300

## 2021-10-02 MED ORDER — ACETAMINOPHEN 650 MG RE SUPP: 650.0000 mg | Freq: Four times a day (QID) | RECTAL | Status: DC | PRN

## 2021-10-02 MED ORDER — INSULIN ASPART 100 UNIT/ML IJ SOLN
0.0000 [IU] | Freq: Three times a day (TID) | INTRAMUSCULAR | Status: DC
  Administered 2021-10-02: 1 [IU] via SUBCUTANEOUS
  Administered 2021-10-05 (×3): 2 [IU] via SUBCUTANEOUS
  Administered 2021-10-06 – 2021-10-07 (×5): 1 [IU] via SUBCUTANEOUS

## 2021-10-02 MED ORDER — METOPROLOL TARTRATE 50 MG PO TABS
50.0000 mg | ORAL_TABLET | Freq: Two times a day (BID) | ORAL | Status: DC
  Administered 2021-10-02 – 2021-10-05 (×7): 50 mg via ORAL
  Filled 2021-10-02 (×7): qty 1

## 2021-10-02 MED ORDER — POTASSIUM CHLORIDE 20 MEQ PO PACK
40.0000 meq | PACK | Freq: Two times a day (BID) | ORAL | Status: DC
  Administered 2021-10-02 – 2021-10-04 (×5): 40 meq via ORAL
  Filled 2021-10-02 (×7): qty 2

## 2021-10-02 MED ORDER — INSULIN ASPART 100 UNIT/ML IJ SOLN: 0.0000 [IU] | Freq: Every day | INTRAMUSCULAR | Status: DC

## 2021-10-02 MED ORDER — ENSURE ENLIVE PO LIQD
237.0000 mL | Freq: Two times a day (BID) | ORAL | Status: DC
  Administered 2021-10-02: 237 mL via ORAL

## 2021-10-02 MED ORDER — ROSUVASTATIN CALCIUM 5 MG PO TABS
10.0000 mg | ORAL_TABLET | Freq: Every day | ORAL | Status: DC
  Administered 2021-10-02 – 2021-10-22 (×18): 10 mg via ORAL
  Filled 2021-10-02 (×7): qty 2
  Filled 2021-10-02: qty 1
  Filled 2021-10-02 (×11): qty 2

## 2021-10-02 MED ORDER — ONDANSETRON HCL 4 MG PO TABS
4.0000 mg | ORAL_TABLET | Freq: Four times a day (QID) | ORAL | Status: DC | PRN
  Administered 2021-10-04 – 2021-10-06 (×2): 4 mg via ORAL
  Filled 2021-10-02 (×2): qty 1

## 2021-10-02 NOTE — Progress Notes (Signed)
Pharmacy Antibiotic Note  Lindsey Murillo is a 26 y.o. female admitted on 10/01/2021 with sepsis.  Pharmacy has been consulted for cefepime and vancomycin dosing. Patient with known CKD stage IV and recent admission in 06/2021 for acute on chronic renal failure and anemia of chronic disease. Scr 4.76, WBC 38.9  Plan: Cefepime 2 G IV q24 hours Vancomycin 1G IV x1 then 750mg  IV q48 hours for estimated AUC 434 Monitor renal function closely   Height: 5\' 7"  (170.2 cm) Weight: 64.5 kg (142 lb 4.8 oz) IBW/kg (Calculated) : 61.6  Temp (24hrs), Avg:98.4 F (36.9 C), Min:98.4 F (36.9 C), Max:98.4 F (36.9 C)  Recent Labs  Lab 10/01/21 2053 10/01/21 2205 10/01/21 2336  WBC 38.9*  --   --   CREATININE 4.76*  --   --   LATICACIDVEN  --  0.7 0.7    Estimated Creatinine Clearance: 17.3 mL/min (A) (by C-G formula based on SCr of 4.76 mg/dL (H)).    No Known Allergies  Antimicrobials this admission: Vancomycin 5/26>> Cefepime 5/26>>  Microbiology results: Pending   Thank you for allowing pharmacy to be a part of this patient's care.  Jodean Lima Beckam Abdulaziz 10/02/2021 12:35 AM

## 2021-10-02 NOTE — Progress Notes (Signed)
  Transition of Care [facility]) Screening Note   Patient Details  Name: Lindsey Murillo Date of Birth: 26-Jul-1995   Transition of Care [facility]) CM/SW Contact:    Iona Beard, [place] Phone Number: 10/02/2021, 11:43 AM    Transition of Care Department [facility]) has reviewed patient and no TOC needs have been identified at this time. We will continue to monitor patient advancement through interdisciplinary progression rounds. If new patient transition needs arise, please place a TOC consult.

## 2021-10-02 NOTE — Assessment & Plan Note (Addendum)
Ruled in for sepsis at admission Due to L neck abscess CT with 4x2.4x4.8 cm phlegmon (limited evaluation due to absence of contrast), enlarged L upper chain cervical chain LN S/p I&D 5/28, per surgery -> wet to dry dressing changes BID, santyl to wound base Concern for R breast infection as well, no definite or focal fluid collection noted on [place] MSSA on 5/28 culture Blood cultures ngtd Vancomycin 5/25 Cefepime 5/25-5/28 linezolid 5/27 - 5/30 Ancef 5/30 -10/21/2021 Narrowed to ancef, and completed a 14-day course of antibiotic treatment.  Surgery recommended 10 days abx (doxycycline) for R breast which was completed. -Leukocytosis fluctuating and subsequently trended down. -Patient remained afebrile. -Chest x-ray negative for any acute infiltrate.   -Blood cultures with no growth to date. -Patient was discharged in stable and improved condition. -Outpatient follow-up with general surgery/breast clinic.

## 2021-10-02 NOTE — ED Notes (Signed)
Asked pt for urine sample.  Does not have urge to go to restroom at this time.

## 2021-10-02 NOTE — Assessment & Plan Note (Addendum)
Follow Elevated ferritin, low iron - normal folate, elevated b12 S/p 1 unit pRBC -Hemoglobin stabilized at 9.5 by day of discharge from a lower  6.9 (10/17/2021 ) after transfusion of 2 units packed red blood cells during HD on 10/17/2021.   -Patient with no overt bleeding.

## 2021-10-02 NOTE — Assessment & Plan Note (Addendum)
Uncontrolled type I diabetic with a last hemoglobin A1c of 9.8 (09/24/2021). Basal insulin appears it was d/c'd due to hypoglycemia?  -Patient maintained on sliding scale insulin during the hospitalization.  -Patient was discharged home on Semglee 5 units daily with outpatient follow-up with PCP.

## 2021-10-02 NOTE — H&P (Signed)
History and Physical    Patient: Lindsey Murillo SEG:[id number] DOB: 09-Sep-1995 DOA: 10/01/2021 DOS: the patient was seen and examined on 10/02/2021 PCP: Coral Spikes, DO  Patient coming from: Home  Chief Complaint:  Chief Complaint  Patient presents with   Leg Swelling    X 2 weeks    HPI: MAUD RUBENDALL is a 26 y.o. female with medical history significant of with history of anemia and stage IV kidney disease, diabetic neuropathy, type 1 diabetes, goiter, hypertension, and more presents the ED with chief complaint of not feeling well.  Patient is resistant to answer questions and generally has an exasperated attitude towards healthcare provider both in the ED and during my exam.  Mother is at bedside and answers most of her questions.  Mother reports that patient is not been feeling well for 2 weeks.  Its not been progressively worse but constant since it started.  Mother suspects it is related to her injection of erythropoietin last week.  She reports that patient had a cough that was nonproductive, but the cough is mostly resolved now.  Patient has been chilled but has not had a measurable fever.  Patient has been fatigued, rundown, nauseous but has not vomited.  Patient has chronic diarrhea with 2-3 loose stools daily.  No blood in the stool.  No hematuria, no dysuria.  Patient makes very little urine due to her kidney disease.  Patient complains of chest pain that she describes as tightness.  It was worse when she was coughing.  Is also worse with ambulation, and associated with shortness of breath.  She reports the chest pain has been present since her shot of erythropoietin.  Patient complains of chronic stomach pain-not new for her.  She is not sure the location of the pain.  She describes it as a sharp pain that last most the day.  Having a bowel movement or eating makes it worse.  Nothing makes it better.  When he goes away it goes away on its own.  At first, patient was refusing  physical exam and source could not be identified for her infection.  She was tachycardic, borderline hypotensive, and had a massive leukocytosis of 38.9.  She did have a shot of erythropoietin, but this should not result in a leukocytosis of 38.9.  Procalcitonin was done at 1.85 making sepsis more likely.  Finally when she did allow an exam she has a draining abscess on the back of her neck.  This is likely the source.  Urine is still pending.  Mother and patient report that they just noticed this abscess when it started draining 2 days ago.  They did not notice the swelling beforehand.  Very painful.  Patient reports it is worse when she tries to move her neck.  Nothing makes it better.  Mother put ointment on it, which offered her no relief per her report.  Patient does not smoke, does not drink alcohol, does not use illicit drugs.  Patient is vaccinated for COVID.  Patient is full code.  Patient has no other complaints at this time. Review of Systems: As mentioned in the history of present illness. All other systems reviewed and are negative. Past Medical History:  Diagnosis Date   Acanthosis nigricans, acquired    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 chronic kidney disease ([place]) 09/25/2021   Asthma    Diabetic autonomic neuropathy ([name])    Diabetic peripheral neuropathy ([place])    Environmental allergies    Goiter  Hypoglycemia associated with diabetes ([name])    Tachycardia    Thyroiditis, autoimmune    Type 1 diabetes mellitus in patient age 60-19 years with HbA1C goal below 7.5    Past Surgical History:  Procedure Laterality Date   BIOPSY  08/27/2016   Procedure: BIOPSY;  Surgeon: Danie Binder, MD;  Location: [facility];  Service: Endoscopy;;  duodenum; gastric   BIOPSY  06/27/2019   Procedure: BIOPSY;  Surgeon: Daneil Dolin, MD;  Location: [facility];  Service: Endoscopy;;   COLONOSCOPY     ESOPHAGOGASTRODUODENOSCOPY N/A 08/27/2016   Dr. Oneida Alar: mild gastritis. Negative celiac. No obvious  source for dyspepsia/diarrhea   ESOPHAGOGASTRODUODENOSCOPY (EGD) WITH PROPOFOL N/A 06/27/2019   rourk: Focal abnormality of the gastric mucosa likely due to trauma (heaving).  Biopsy showed mild gastritis, negative for H. pylori.  Esophageal dilation for history of dysphagia but normal-appearing esophagus.   Social History:  reports that she has never smoked. She has never used smokeless tobacco. She reports that she does not drink alcohol and does not use drugs.  No Known Allergies  Family History  Problem Relation Age of Onset   Diabetes Mother        Type II DM   Thyroid disease Mother    Diabetes Maternal Grandmother        Type II DM   Diabetes Cousin        Type II DM   Colon cancer Neg Hx    Colon polyps Neg Hx     Prior to Admission medications   Medication Sig Start Date End Date Taking? Authorizing Provider  acetaminophen (TYLENOL) 325 MG tablet Take 650 mg by mouth every 6 (six) hours as needed (for pain).    [provider]  blood glucose meter kit and supplies KIT Dispense based on patient and insurance preference. Use up to four times daily as directed. 05/26/21   Coral Spikes, DO  calcium acetate, Phos Binder, (PHOSLYRA) 667 MG/5ML SOLN Take by mouth 3 (three) times daily with meals.    [provider]  Continuous Blood Gluc Receiver (FREESTYLE LIBRE 2 READER) DEVI As directed 06/02/21   Cassandria Anger, MD  Continuous Blood Gluc Sensor (FREESTYLE LIBRE 2 SENSOR) MISC 1 Piece by Does not apply route every 14 (fourteen) days. 06/02/21   Cassandria Anger, MD  feeding supplement (ENSURE ENLIVE / ENSURE PLUS) LIQD Take 237 mLs by mouth 2 (two) times daily between meals. 06/23/21   Orson Eva, MD  insulin glargine-yfgn (SEMGLEE) 100 UNIT/ML injection 12 Units daily. 05/26/21   [provider]  Insulin Pen Needle 31G X 4 MM MISC Use as directed to administer insulin. 05/26/21   Coral Spikes, DO  metoprolol tartrate (LOPRESSOR) 50 MG tablet Take  50 mg by mouth 2 (two) times daily.    [provider]  mupirocin ointment (BACTROBAN) 2 % Apply topically 2 (two) times daily. 08/11/21   [provider]  polyethylene glycol-electrolytes (TRILYTE) 420 g solution Take 4,000 mLs by mouth as directed. 09/24/21   Eloise Harman, DO  potassium chloride (KLOR-CON) 20 MEQ packet Take 40 mEq by mouth daily. 06/23/21   Orson Eva, MD  rosuvastatin (CRESTOR) 10 MG tablet Take 1 tablet (10 mg total) by mouth daily. 06/01/21   Coral Spikes, DO  sulfamethoxazole-trimethoprim (BACTRIM DS) 800-160 MG tablet Take 1 tablet by mouth 2 (two) times daily. 08/11/21   [provider]  torsemide (DEMADEX) 20 MG tablet  Take 60 mg by mouth 2 (two) times daily. 07/06/21   [provider]  vitamin B-12 (CYANOCOBALAMIN) 1000 MCG tablet Take 1,000 mcg by mouth daily.    [provider]    Physical Exam: Vitals:   10/02/21 0400 10/02/21 0430 10/02/21 0521 10/02/21 0531  BP: 103/75 91/62 105/61   Pulse: 96 93 (!) 107   Resp: 16 17    Temp: 98.2 F (36.8 C)  98.5 F (36.9 C)   TempSrc: Oral  Oral   SpO2: 100% 99% 100%   Weight:    63.5 kg  Height:    _0  (1.702 m)   1.  General: Patient lying supine in bed,  no acute distress   2. Psychiatric: Alert and oriented x 3, flat affect, abnormally irritable with healthcare provider, reluctantly cooperative with exam   3. Neurologic: Speech and language are normal, face is symmetric, moves all 4 extremities voluntarily, at baseline without acute deficits on limited exam   4. HEENMT:  Head is atraumatic, normocephalic, pupils reactive to light, neck is supple, trachea is midline, mucous membranes are moist   5. Respiratory : Lungs are clear to auscultation bilaterally without wheezing, rhonchi, rales, no cyanosis, no increase in work of breathing or accessory muscle use   6. Cardiovascular : Heart rate normal, rhythm is regular, no murmurs, rubs or gallops, significant  peripheral edema and venous stasis changes, peripheral pulses palpated   7. Gastrointestinal:  Abdomen is soft, nondistended, nontender to palpation bowel sounds active, no masses or organomegaly palpated   8. Skin:  Draining abscess on left side posterior neck   9.Musculoskeletal:  No acute deformities or trauma, no asymmetry in tone, significant peripheral edema and venous stasis changes, peripheral pulses palpated, no tenderness to palpation in the extremities  Data Reviewed: In the ED Temp 98.4, heart rate 102-109, respiratory rate 15-17, blood pressure 96/67-130/98, satting at 98% Leukocytosis 38.9, hemoglobin 8.6, platelets 330 Hypokalemia 2.7 AKI with a creatinine of 4.76 Decreased albumin at 1.5 Blood cultures pending Lactic acid normal x2 Chest x-ray shows no active disease UA pending EKG shows sinus tach Cefepime, bank, Flagyl started in the ED  Assessment and Plan: * Sepsis ([place]) - Tachycardic with a heart rate 102-109, leukocytosis with white blood cell count of 38.9, AKI with an increase in creatinine from 3.1--> 4.76 -Most likely sources abscess on posterior neck -Vancomycin, cefepime, Flagyl started in the ED -Continue vancomycin and cefepime -Chest x-ray shows no active disease -UA is pending -Continue LR, but reduced dose to 100 mL/h as patient is already having issues with volume status -Procalcitonin 1.5 -Sepsis order set utilized -Ultrasound soft tissue neck to assess for any drainable pockets in the abscess -Continue to monitor  Protein-calorie malnutrition, severe ([name]) - Albumin 1.5 -Continue feeding supplement -Given significant peripheral edema with this albumin, and decreased urine output, 3 rounds of albumin discussed with patient who is agreeable to this infusion -Continue to monitor  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 chronic kidney disease ([name]) - Hemoglobin 8.6 -This is down from 9.5 a week ago -No active signs or symptoms of bleeding -EPO and iron  infusion last week -Trend in the a.m.  Peripheral edema - Continue torsemide -Continue albumin -SCDs -Monitor clinical response  Uncontrolled type 1 diabetes mellitus with hyperglycemia ([name]) - Uncontrolled type I diabetic with a last hemoglobin A1c of 9.8 -Continue home dose of basal insulin -Sliding scale coverage -Carb modified diet -Monitor CBGs      Advance Care Planning:  Code Status: Full Code   Consults: None  Family Communication: Mother at bedside  Severity of Illness: The appropriate patient status for this patient is OBSERVATION. Observation status is judged to be reasonable and necessary in order to provide the required intensity of service to ensure the patient's safety. The patient's presenting symptoms, physical exam findings, and initial radiographic and laboratory data in the context of their medical condition is felt to place them at decreased risk for further clinical deterioration. Furthermore, it is anticipated that the patient will be medically stable for discharge from the hospital within 2 midnights of admission.   Author: Rolla Plate, DO 10/02/2021 6:48 AM  For on call review [email or web address].

## 2021-10-02 NOTE — Progress Notes (Signed)
10/02/2021  Examined abscess with Dr. Arnoldo Morale this morning and patient needs to transfer to higher level of care due to multiple co-morbidities.  He recommended I reach out to ENT. I reached out to Dr. Benjamine Mola and he called me back and said location was too posterior for where he normally operates and this is not an operation for ENT.  I also reached out to surgery at [facility] who agreed that patient needed I&D and agreed to consult when patient arrives at [facility]. Pt will be transferred to [facility] when bed available.  Discussed with pharm D changing antibiotic over to Zyvox due to stage IV CKD. Continue IV antibiotics and supportive measures.  Pt will transfer to [facility] at [facility].  I notified patient placement and sent sign-out communication.  Murvin Natal, MD

## 2021-10-02 NOTE — Sepsis Progress Note (Addendum)
Monitoring for the code sepsis protocol.  Lactic & blood cultures collected several  hours prior to code sepsis order.

## 2021-10-02 NOTE — Assessment & Plan Note (Addendum)
Diuresis with albumin for nephrotic syndrome. -Outpatient follow-up with nephrology.

## 2021-10-02 NOTE — Assessment & Plan Note (Addendum)
Repleted.   -On hemodialysis.

## 2021-10-02 NOTE — Assessment & Plan Note (Addendum)
-  Patient was placed on nutritional supplementation.

## 2021-10-02 NOTE — Progress Notes (Signed)
Hypoglycemic Event  CBG: 44  Treatment: 8 oz juice/soda  Symptoms: None  Follow-up CBG: Time:2153 CBG Result:48  Treatment: 8 oz juice/soda  Symptoms: None  Follow-up CBG: Time:2215 CBG Result:57  Treatment: none (per protocol, oral gel was ordered but RN was waiting on pharmacy to send the gel to the unit. The BG resolved prior to administration of gel.)   Symptoms: None  Follow-up CBG: Time:2237 CBG Result:71  Possible Reasons for Event: Unknown  Comments/MD notified:Ouma NP notified    Kathrene Bongo

## 2021-10-03 DIAGNOSIS — A419 Sepsis, unspecified organism: Secondary | ICD-10-CM | POA: Diagnosis not present

## 2021-10-03 DIAGNOSIS — E876 Hypokalemia: Secondary | ICD-10-CM | POA: Diagnosis not present

## 2021-10-03 DIAGNOSIS — L0211 Cutaneous abscess of neck: Secondary | ICD-10-CM | POA: Diagnosis not present

## 2021-10-03 DIAGNOSIS — N184 Chronic kidney disease, stage 4 (severe): Secondary | ICD-10-CM | POA: Diagnosis not present

## 2021-10-03 LAB — COMPREHENSIVE METABOLIC PANEL
ALT: 14 U/L (ref 0–44)
AST: 11 U/L — ABNORMAL LOW (ref 15–41)
Albumin: 1.7 g/dL — ABNORMAL LOW (ref 3.5–5.0)
Alkaline Phosphatase: 242 U/L — ABNORMAL HIGH (ref 38–126)
Anion gap: 10 (ref 5–15)
BUN: 64 mg/dL — ABNORMAL HIGH (ref 6–20)
CO2: 13 mmol/L — ABNORMAL LOW (ref 22–32)
Calcium: 7.6 mg/dL — ABNORMAL LOW (ref 8.9–10.3)
Chloride: 104 mmol/L (ref 98–111)
Creatinine, Ser: 4.8 mg/dL — ABNORMAL HIGH (ref 0.44–1.00)
GFR, Estimated: 12 mL/min — ABNORMAL LOW (ref >60)
Glucose, Bld: 68 mg/dL — ABNORMAL LOW (ref 70–99)
Potassium: 3.3 mmol/L — ABNORMAL LOW (ref 3.5–5.1)
Sodium: 127 mmol/L — ABNORMAL LOW (ref 135–145)
Total Bilirubin: 0.6 mg/dL (ref 0.3–1.2)
Total Protein: 5.4 g/dL — ABNORMAL LOW (ref 6.5–8.1)

## 2021-10-03 LAB — CBC WITH DIFFERENTIAL/PLATELET
Abs Immature Granulocytes: 0 10*3/uL (ref 0.00–0.07)
Basophils Absolute: 0 10*3/uL (ref 0.0–0.1)
Basophils Relative: 0 %
Eosinophils Absolute: 0 10*3/uL (ref 0.0–0.5)
Eosinophils Relative: 0 %
HCT: 22.8 % — ABNORMAL LOW (ref 36.0–46.0)
Hemoglobin: 7.5 g/dL — ABNORMAL LOW (ref 12.0–15.0)
Lymphocytes Relative: 5 %
Lymphs Abs: 1.9 10*3/uL (ref 0.7–4.0)
MCH: 28.1 pg (ref 26.0–34.0)
MCHC: 32.9 g/dL (ref 30.0–36.0)
MCV: 85.4 fL (ref 80.0–100.0)
Monocytes Absolute: 0.4 10*3/uL (ref 0.1–1.0)
Monocytes Relative: 1 %
Neutro Abs: 35.6 10*3/uL — ABNORMAL HIGH (ref 1.7–7.7)
Neutrophils Relative %: 94 %
Platelets: 315 10*3/uL (ref 150–400)
RBC: 2.67 MIL/uL — ABNORMAL LOW (ref 3.87–5.11)
RDW: 15.9 % — ABNORMAL HIGH (ref 11.5–15.5)
WBC: 37.9 10*3/uL — ABNORMAL HIGH (ref 4.0–10.5)
nRBC: 0 /100 WBC
nRBC: 0.1 % (ref 0.0–0.2)

## 2021-10-03 LAB — PROCALCITONIN: Procalcitonin: 1.86 ng/mL

## 2021-10-03 LAB — TSH: TSH: 0.975 u[IU]/mL (ref 0.350–4.500)

## 2021-10-03 LAB — PROTIME-INR
INR: 1.4 — ABNORMAL HIGH (ref 0.8–1.2)
Prothrombin Time: 16.8 seconds — ABNORMAL HIGH (ref 11.4–15.2)

## 2021-10-03 LAB — GLUCOSE, CAPILLARY
Glucose-Capillary: 52 mg/dL — ABNORMAL LOW (ref 70–99)
Glucose-Capillary: 138 mg/dL — ABNORMAL HIGH (ref 70–99)
Glucose-Capillary: 50 mg/dL — ABNORMAL LOW (ref 70–99)
Glucose-Capillary: 54 mg/dL — ABNORMAL LOW (ref 70–99)
Glucose-Capillary: 135 mg/dL — ABNORMAL HIGH (ref 70–99)
Glucose-Capillary: 81 mg/dL (ref 70–99)
Glucose-Capillary: 78 mg/dL (ref 70–99)
Glucose-Capillary: 86 mg/dL (ref 70–99)

## 2021-10-03 LAB — SURGICAL PCR SCREEN
MRSA, PCR: NEGATIVE
Staphylococcus aureus: POSITIVE — AB

## 2021-10-03 LAB — MAGNESIUM: Magnesium: 1.7 mg/dL (ref 1.7–2.4)

## 2021-10-03 MED ORDER — MUPIROCIN 2 % EX OINT
1.0000 "application " | TOPICAL_OINTMENT | Freq: Two times a day (BID) | CUTANEOUS | Status: AC
  Administered 2021-10-03 – 2021-10-08 (×10): 1 via NASAL
  Filled 2021-10-03 (×3): qty 22

## 2021-10-03 MED ORDER — POLYETHYLENE GLYCOL 3350 17 G PO PACK
17.0000 g | PACK | Freq: Every day | ORAL | Status: DC | PRN
  Administered 2021-10-07: 17 g via ORAL
  Filled 2021-10-03 (×2): qty 1

## 2021-10-03 MED ORDER — DEXTROSE 50 % IV SOLN
INTRAVENOUS | Status: AC
  Administered 2021-10-03: 25 g via INTRAVENOUS
  Filled 2021-10-03: qty 50

## 2021-10-03 MED ORDER — HYDROMORPHONE HCL 1 MG/ML IJ SOLN
0.5000 mg | INTRAMUSCULAR | Status: DC | PRN
  Administered 2021-10-04: 0.5 mg via INTRAVENOUS
  Filled 2021-10-03: qty 0.5

## 2021-10-03 MED ORDER — DEXTROSE 50 % IV SOLN: 25.0000 g | INTRAVENOUS | Status: AC

## 2021-10-03 MED ORDER — DEXTROSE 50 % IV SOLN
INTRAVENOUS | Status: AC
  Administered 2021-10-03: 50 mL
  Filled 2021-10-03: qty 50

## 2021-10-03 MED ORDER — DEXTROSE-NACL 5-0.9 % IV SOLN: INTRAVENOUS | Status: DC

## 2021-10-03 NOTE — Progress Notes (Signed)
Pt has not voided since this morning, bladder scanned with 363cc noted

## 2021-10-03 NOTE — Progress Notes (Signed)
Hypoglycemic Event  CBG: 52   Treatment: D50 50 mL (25 gm)  Symptoms: None  Follow-up CBG: Time:0337 CBG Result:138  Possible Reasons for Event: Unknown  Comments/MD notified:Ouma NP notified of low BG, treatment and follow-up CBG.     Kathrene Bongo

## 2021-10-03 NOTE — Consult Note (Signed)
Reason for Consult:neck abscess Referring Physician: Dr [place]  Lindsey Murillo is an 26 y.o. female.  HPI: 44 yof with mmp including CKD, DM who has fatigue and neck pain. She has a mass on posterolateral left neck that has been present for at least one week. It was draining at home as of Monday.  This was purulent per her mom. She doesn't really talk much.  She was seen at outside hospital and unfortunately did not get this drained there.  She had an [place] that is really nondiagnostic and a ct scan that appears to show phlegmon/abscess.  This is posterior and does not impact her airway as apparently was the concern.  We were asked to see her. She has neck pain. She has just eaten a regular breakfast.   Past Medical History:  Diagnosis Date   Acanthosis nigricans, acquired    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 chronic kidney disease ([name]) 09/25/2021   Asthma    Diabetic autonomic neuropathy ([name])    Diabetic peripheral neuropathy ([name])    Environmental allergies    Goiter    Hypoglycemia associated with diabetes ([name])    Tachycardia    Thyroiditis, autoimmune    Type 1 diabetes mellitus in patient age 59-19 years with HbA1C goal below 7.5     Past Surgical History:  Procedure Laterality Date   BIOPSY  08/27/2016   Procedure: BIOPSY;  Surgeon: Lindsey Binder, MD;  [facility];  Service: Endoscopy;;  duodenum; gastric   BIOPSY  06/27/2019   Procedure: BIOPSY;  Surgeon: Lindsey Dolin, MD;  [facility];  Service: Endoscopy;;   COLONOSCOPY     ESOPHAGOGASTRODUODENOSCOPY N/A 08/27/2016   Dr. One[place] Murillo: mild gastritis. Negative celiac. No obvious source for dyspepsia/diarrhea   ESOPHAGOGASTRODUODENOSCOPY (EGD) WITH PROPOFOL N/A 06/27/2019   Lindsey Murillo: Focal abnormality of the gastric mucosa likely due to trauma (heaving).  Biopsy showed mild gastritis, negative for H. pylori.  Esophageal dilation for history of dysphagia but normal-appearing esophagus.    Family History  Problem Relation  Age of Onset   Diabetes Mother        Type II DM   Thyroid disease Mother    Diabetes Maternal Grandmother        Type II DM   Diabetes Cousin        Type II DM   Colon cancer Neg Hx    Colon polyps Neg Hx     Social History:  reports that she has never smoked. She has never used smokeless tobacco. She reports that she does not drink alcohol and does not use drugs.  Allergies: No Known Allergies  Medications: I have reviewed the patient's current medications. No current facility-administered medications on file prior to encounter.   Current Outpatient Medications on File Prior to Encounter  Medication Sig Dispense Refill   acetaminophen (TYLENOL) 325 MG tablet Take 650 mg by mouth every 6 (six) hours as needed for moderate pain (for pain).     metoprolol tartrate (LOPRESSOR) 50 MG tablet Take 50 mg by mouth 2 (two) times daily.     rosuvastatin (CRESTOR) 10 MG tablet Take 1 tablet (10 mg total) by mouth daily. 90 tablet 3   torsemide (DEMADEX) 20 MG tablet Take 60 mg by mouth 2 (two) times daily.     vitamin B-12 (CYANOCOBALAMIN) 1000 MCG tablet Take 1,000 mcg by mouth daily.     blood glucose meter kit and supplies KIT Dispense based on patient and insurance  preference. Use up to four times daily as directed. 1 each 0   Continuous Blood Gluc Receiver (FREESTYLE LIBRE 2 READER) DEVI As directed 1 each 0   Continuous Blood Gluc Sensor (FREESTYLE LIBRE 2 SENSOR) MISC 1 Piece by Does not apply route every 14 (fourteen) days. 2 each 3   feeding supplement (ENSURE ENLIVE / ENSURE PLUS) LIQD Take 237 mLs by mouth 2 (two) times daily between meals. (Patient not taking: Reported on 10/02/2021) 237 mL 12   insulin glargine-yfgn (SEMGLEE) 100 UNIT/ML injection 12 Units daily. (Patient not taking: Reported on 10/02/2021)     Insulin Pen Needle 31G X 4 MM MISC Use as directed to administer insulin. 100 each 1   polyethylene glycol-electrolytes (TRILYTE) 420 g solution Take 4,000 mLs by mouth as  directed. (Patient not taking: Reported on 10/02/2021) 4000 mL 0   potassium chloride (KLOR-CON) 20 MEQ packet Take 40 mEq by mouth daily. (Patient not taking: Reported on 10/02/2021) 30 each 1   sulfamethoxazole-trimethoprim (BACTRIM DS) 800-160 MG tablet Take 1 tablet by mouth 2 (two) times daily. (Patient not taking: Reported on 10/02/2021)      Results for orders placed or performed during the hospital encounter of 10/01/21 (from the past 48 hour(s))  CBG monitoring, ED     Status: Abnormal   Collection Time: 10/01/21  7:56 PM  Result Value Ref Range   Glucose-Capillary 217 (H) 70 - 99 mg/dL    Comment: Glucose reference range applies only to samples taken after fasting for at least 8 hours.  CBC     Status: Abnormal   Collection Time: 10/01/21  8:53 PM  Result Value Ref Range   WBC 38.9 (H) 4.0 - 10.5 K/uL   RBC 3.06 (L) 3.87 - 5.11 MIL/uL   Hemoglobin 8.6 (L) 12.0 - 15.0 g/dL   HCT 26.2 (L) 36.0 - 46.0 %   MCV 85.6 80.0 - 100.0 fL   MCH 28.1 26.0 - 34.0 pg   MCHC 32.8 30.0 - 36.0 g/dL   RDW 15.7 (H) 11.5 - 15.5 %   Platelets 330 150 - 400 K/uL   nRBC 0.1 0.0 - 0.2 %    Comment: Performed at [facility], [address] [place], [place] [address]  Basic metabolic panel     Status: Abnormal   Collection Time: 10/01/21  8:53 PM  Result Value Ref Range   Sodium 130 (L) 135 - 145 mmol/L   Potassium 2.7 (LL) 3.5 - 5.1 mmol/L    Comment: CRITICAL RESULT CALLED TO, READ BACK BY AND VERIFIED WITH: Lindsey Murillo ON 10/01/21 AT 2145 BY LOY,C    Chloride 107 98 - 111 mmol/L   CO2 14 (L) 22 - 32 mmol/L   Glucose, Bld 179 (H) 70 - 99 mg/dL    Comment: Glucose reference range applies only to samples taken after fasting for at least 8 hours.   BUN 66 (H) 6 - 20 mg/dL   Creatinine, Ser 4.76 (H) 0.44 - 1.00 mg/dL   Calcium 7.7 (L) 8.9 - 10.3 mg/dL   GFR, Estimated 12 (L) >60 mL/min    Comment: (NOTE) Calculated using the CKD-EPI Creatinine Equation (2021)    Anion gap 9 5 - 15    Comment:  Performed at [facility], [address], [place], [place] [address]  Brain natriuretic peptide     Status: Abnormal   Collection Time: 10/01/21  8:53 PM  Result Value Ref Range   B Natriuretic Peptide 626.0 (H) 0.0 -  100.0 pg/mL    Comment: Performed at [facility], [address] [place], [place] [address]  Lactic acid, plasma     Status: None   Collection Time: 10/01/21 10:05 PM  Result Value Ref Range   Lactic Acid, Venous 0.7 0.5 - 1.9 mmol/L    Comment: Performed at [facility], [address] [place], [place] [address]  Protime-INR     Status: Abnormal   Collection Time: 10/01/21 10:05 PM  Result Value Ref Range   Prothrombin Time 15.3 (H) 11.4 - 15.2 seconds   INR 1.2 0.8 - 1.2    Comment: (NOTE) INR goal varies based on device and disease states. Performed at [facility], [address] [place], [place] [address]   Culture, blood (Routine x 2)     Status: None (Preliminary result)   Collection Time: 10/01/21 10:05 PM   Specimen: BLOOD  Result Value Ref Range   Specimen Description BLOOD RIGHT ANTECUBITAL    Special Requests      BOTTLES DRAWN AEROBIC AND ANAEROBIC Blood Culture adequate volume   Culture      NO GROWTH 2 DAYS Performed at [facility], [address] [place], [place] [address]    Report Status PENDING   Culture, blood (Routine x 2)     Status: None (Preliminary result)   Collection Time: 10/01/21 10:05 PM   Specimen: BLOOD  Result Value Ref Range   Specimen Description BLOOD BLOOD LEFT HAND    Special Requests      BOTTLES DRAWN AEROBIC AND ANAEROBIC Blood Culture adequate volume   Culture      NO GROWTH 2 DAYS Performed at [facility], [address] [place], [place] [address]    Report Status PENDING   Lactic acid, plasma     Status: None   Collection Time: 10/01/21 11:36 PM  Result Value Ref Range   Lactic Acid, Venous 0.7 0.5 - 1.9 mmol/L    Comment: Performed at [facility], [address], [place], [place] [address]  Hepatic function  panel     Status: Abnormal   Collection Time: 10/01/21 11:36 PM  Result Value Ref Range   Total Protein 5.4 (L) 6.5 - 8.1 g/dL   Albumin 1.5 (L) 3.5 - 5.0 g/dL   AST 13 (L) 15 - 41 U/L   ALT 19 0 - 44 U/L   Alkaline Phosphatase 310 (H) 38 - 126 U/L   Total Bilirubin 0.4 0.3 - 1.2 mg/dL   Bilirubin, Direct 0.1 0.0 - 0.2 mg/dL   Indirect Bilirubin 0.3 0.3 - 0.9 mg/dL    Comment: Performed at [facility], [address] [place],  [address]  Procalcitonin - Baseline     Status: None   Collection Time: 10/01/21 11:36 PM  Result Value Ref Range   Procalcitonin 1.85 ng/mL    Comment:        Interpretation: PCT > 0.5 ng/mL and <= 2 ng/mL: Systemic infection (sepsis) is possible, but other conditions are known to elevate PCT as well. (NOTE)       Sepsis PCT Algorithm           Lower Respiratory Tract                                      Infection PCT Algorithm    ----------------------------     ----------------------------  PCT < 0.25 ng/mL                PCT < 0.10 ng/mL          Strongly encourage             Strongly discourage   discontinuation of antibiotics    initiation of antibiotics    ----------------------------     -----------------------------       PCT 0.25 - 0.50 ng/mL            PCT 0.10 - 0.25 ng/mL               OR       >80% decrease in PCT            Discourage initiation of                                            antibiotics      Encourage discontinuation           of antibiotics    ----------------------------     -----------------------------         PCT >= 0.50 ng/mL              PCT 0.26 - 0.50 ng/mL                AND       <80% decrease in PCT             Encourage initiation of                                             antibiotics       Encourage continuation           of antibiotics    ----------------------------     -----------------------------        PCT >= 0.50 ng/mL                  PCT > 0.50 ng/mL               AND          increase in PCT                  Strongly encourage                                      initiation of antibiotics    Strongly encourage escalation           of antibiotics                                     -----------------------------                                           PCT <= 0.25 ng/mL  OR                                        > 80% decrease in PCT                                      Discontinue / Do not initiate                                             antibiotics  Performed at [facility], [address], [place], [place] [address]   Glucose, capillary     Status: Abnormal   Collection Time: 10/02/21  7:22 AM  Result Value Ref Range   Glucose-Capillary 117 (H) 70 - 99 mg/dL    Comment: Glucose reference range applies only to samples taken after fasting for at least 8 hours.  Glucose, capillary     Status: Abnormal   Collection Time: 10/02/21 11:23 AM  Result Value Ref Range   Glucose-Capillary 116 (H) 70 - 99 mg/dL    Comment: Glucose reference range applies only to samples taken after fasting for at least 8 hours.  Urinalysis, Routine w reflex microscopic Urine, Clean Catch     Status: Abnormal   Collection Time: 10/02/21  2:59 PM  Result Value Ref Range   Color, Urine YELLOW YELLOW   APPearance CLOUDY (A) CLEAR   Specific Gravity, Urine 1.011 1.005 - 1.030   pH 5.0 5.0 - 8.0   Glucose, UA 50 (A) NEGATIVE mg/dL   Hgb urine dipstick SMALL (A) NEGATIVE   Bilirubin Urine NEGATIVE NEGATIVE   Ketones, ur NEGATIVE NEGATIVE mg/dL   Protein, ur 100 (A) NEGATIVE mg/dL   Nitrite NEGATIVE NEGATIVE   Leukocytes,Ua TRACE (A) NEGATIVE   RBC / HPF 0-5 0 - 5 RBC/hpf   WBC, UA 11-20 0 - 5 WBC/hpf   Bacteria, UA RARE (A) NONE SEEN   Squamous Epithelial / LPF 11-20 0 - 5   Mucus PRESENT     Comment: Performed at [facility], [address] [place], [place] [address]  Pregnancy, urine     Status: None   Collection Time:  10/02/21  2:59 PM  Result Value Ref Range   Preg Test, Ur NEGATIVE NEGATIVE    Comment:        THE SENSITIVITY OF THIS METHODOLOGY IS >20 mIU/mL. Performed at [facility], [address] [place], [place] [address]   Glucose, capillary     Status: Abnormal   Collection Time: 10/02/21  4:10 PM  Result Value Ref Range   Glucose-Capillary 123 (H) 70 - 99 mg/dL    Comment: Glucose reference range applies only to samples taken after fasting for at least 8 hours.  Glucose, capillary     Status: Abnormal   Collection Time: 10/02/21  9:36 PM  Result Value Ref Range   Glucose-Capillary 44 (LL) 70 - 99 mg/dL    Comment: Glucose reference range applies only to samples taken after fasting for at least 8 hours.   Comment 1 Notify RN   Glucose, capillary     Status: Abnormal   Collection Time: 10/02/21  9:53 PM  Result Value Ref Range   Glucose-Capillary 48 (L)  70 - 99 mg/dL    Comment: Glucose reference range applies only to samples taken after fasting for at least 8 hours.  Glucose, capillary     Status: Abnormal   Collection Time: 10/02/21 10:15 PM  Result Value Ref Range   Glucose-Capillary 57 (L) 70 - 99 mg/dL    Comment: Glucose reference range applies only to samples taken after fasting for at least 8 hours.  Glucose, capillary     Status: None   Collection Time: 10/02/21 10:37 PM  Result Value Ref Range   Glucose-Capillary 71 70 - 99 mg/dL    Comment: Glucose reference range applies only to samples taken after fasting for at least 8 hours.  Glucose, capillary     Status: None   Collection Time: 10/02/21 11:54 PM  Result Value Ref Range   Glucose-Capillary 84 70 - 99 mg/dL    Comment: Glucose reference range applies only to samples taken after fasting for at least 8 hours.  Protime-INR     Status: Abnormal   Collection Time: 10/03/21  1:16 AM  Result Value Ref Range   Prothrombin Time 16.8 (H) 11.4 - 15.2 seconds   INR 1.4 (H) 0.8 - 1.2    Comment: (NOTE) INR goal varies based  on device and disease states. Performed at [facility], [place] [address], [place], [place] [address]   Procalcitonin     Status: None   Collection Time: 10/03/21  1:16 AM  Result Value Ref Range   Procalcitonin 1.86 ng/mL    Comment:        Interpretation: PCT > 0.5 ng/mL and <= 2 ng/mL: Systemic infection (sepsis) is possible, but other conditions are known to elevate PCT as well. (NOTE)       Sepsis PCT Algorithm           Lower Respiratory Tract                                      Infection PCT Algorithm    ----------------------------     ----------------------------         PCT < 0.25 ng/mL                PCT < 0.10 ng/mL          Strongly encourage             Strongly discourage   discontinuation of antibiotics    initiation of antibiotics    ----------------------------     -----------------------------       PCT 0.25 - 0.50 ng/mL            PCT 0.10 - 0.25 ng/mL               OR       >80% decrease in PCT            Discourage initiation of                                            antibiotics      Encourage discontinuation           of antibiotics    ----------------------------     -----------------------------         PCT >= 0.50 ng/mL  PCT 0.26 - 0.50 ng/mL                AND       <80% decrease in PCT             Encourage initiation of                                             antibiotics       Encourage continuation           of antibiotics    ----------------------------     -----------------------------        PCT >= 0.50 ng/mL                  PCT > 0.50 ng/mL               AND         increase in PCT                  Strongly encourage                                      initiation of antibiotics    Strongly encourage escalation           of antibiotics                                     -----------------------------                                           PCT <= 0.25 ng/mL                                                 OR                                         > 80% decrease in PCT                                      Discontinue / Do not initiate                                             antibiotics  Performed at [facility], [address] [place], [place] [address]   Comprehensive metabolic panel     Status: Abnormal   Collection Time: 10/03/21  1:16 AM  Result Value Ref Range   Sodium 127 (L) 135 - 145 mmol/L   Potassium 3.3 (L) 3.5 - 5.1 mmol/L   Chloride 104 98 - 111 mmol/L   CO2 13 (L) 22 - 32 mmol/L   Glucose, Bld 68 (L) 70 -  99 mg/dL    Comment: Glucose reference range applies only to samples taken after fasting for at least 8 hours.   BUN 64 (H) 6 - 20 mg/dL   Creatinine, Ser 4.80 (H) 0.44 - 1.00 mg/dL   Calcium 7.6 (L) 8.9 - 10.3 mg/dL   Total Protein 5.4 (L) 6.5 - 8.1 g/dL   Albumin 1.7 (L) 3.5 - 5.0 g/dL   AST 11 (L) 15 - 41 U/L   ALT 14 0 - 44 U/L   Alkaline Phosphatase 242 (H) 38 - 126 U/L   Total Bilirubin 0.6 0.3 - 1.2 mg/dL   GFR, Estimated 12 (L) >60 mL/min    Comment: (NOTE) Calculated using the CKD-EPI Creatinine Equation (2021)    Anion gap 10 5 - 15    Comment: Performed at [facility], [place] [address], [place], [place] [address]  Magnesium     Status: None   Collection Time: 10/03/21  1:16 AM  Result Value Ref Range   Magnesium 1.7 1.7 - 2.4 mg/dL    Comment: Performed at [facility] [address], [place], [place] [address]  CBC with Differential/Platelet     Status: Abnormal   Collection Time: 10/03/21  1:16 AM  Result Value Ref Range   WBC 37.9 (H) 4.0 - 10.5 K/uL   RBC 2.67 (L) 3.87 - 5.11 MIL/uL   Hemoglobin 7.5 (L) 12.0 - 15.0 g/dL   HCT 22.8 (L) 36.0 - 46.0 %   MCV 85.4 80.0 - 100.0 fL   MCH 28.1 26.0 - 34.0 pg   MCHC 32.9 30.0 - 36.0 g/dL   RDW 15.9 (H) 11.5 - 15.5 %   Platelets 315 150 - 400 K/uL   nRBC 0.1 0.0 - 0.2 %   Neutrophils Relative % 94 %   Neutro Abs 35.6 (H) 1.7 - 7.7 K/uL   Lymphocytes Relative 5 %   Lymphs Abs 1.9  0.7 - 4.0 K/uL   Monocytes Relative 1 %   Monocytes Absolute 0.4 0.1 - 1.0 K/uL   Eosinophils Relative 0 %   Eosinophils Absolute 0.0 0.0 - 0.5 K/uL   Basophils Relative 0 %   Basophils Absolute 0.0 0.0 - 0.1 K/uL   WBC Morphology See Note     Comment: Increased Bands. >20% Bands   nRBC 0 0 /100 WBC   Abs Immature Granulocytes 0.00 0.00 - 0.07 K/uL   Polychromasia PRESENT     Comment: Performed at [facility], [place] [address], [place], [place] [address]  TSH     Status: None   Collection Time: 10/03/21  1:16 AM  Result Value Ref Range   TSH 0.975 0.350 - 4.500 uIU/mL    Comment: Performed by a 3rd Generation assay with a functional sensitivity of <=0.01 uIU/mL. Performed at [facility], [place] [address], [place], [place] [address]   Glucose, capillary     Status: Abnormal   Collection Time: 10/03/21  3:09 AM  Result Value Ref Range   Glucose-Capillary 52 (L) 70 - 99 mg/dL    Comment: Glucose reference range applies only to samples taken after fasting for at least 8 hours.  Glucose, capillary     Status: Abnormal   Collection Time: 10/03/21  3:37 AM  Result Value Ref Range   Glucose-Capillary 138 (H) 70 - 99 mg/dL    Comment: Glucose reference range applies only to samples taken after fasting for at least 8 hours.  Glucose, capillary  Status: Abnormal   Collection Time: 10/03/21  7:52 AM  Result Value Ref Range   Glucose-Capillary 50 (L) 70 - 99 mg/dL    Comment: Glucose reference range applies only to samples taken after fasting for at least 8 hours.  Glucose, capillary     Status: Abnormal   Collection Time: 10/03/21  8:44 AM  Result Value Ref Range   Glucose-Capillary 54 (L) 70 - 99 mg/dL    Comment: Glucose reference range applies only to samples taken after fasting for at least 8 hours.    CT SOFT TISSUE NECK WO CONTRAST  Result Date: 10/02/2021 CLINICAL DATA:  Soft tissue swelling, infection suspected, neck xray done EXAM: CT NECK WITHOUT CONTRAST  TECHNIQUE: Multidetector CT imaging of the neck was performed following the standard protocol without intravenous contrast. RADIATION DOSE REDUCTION: This exam was performed according to the departmental dose-optimization program which includes automated exposure control, adjustment of the mA and/or kV according to patient size and/or use of iterative reconstruction technique. COMPARISON:  None Available. FINDINGS: Pharynx and larynx: Normal. No mass or swelling. Salivary glands: No inflammation, mass, or stone. Thyroid: Normal. Lymph nodes: Enlarged left upper cervical chain lymph nodes. Vascular: Decreased attenuation of the blood pool. Limited intracranial: Negative. Visualized orbits: Negative. Mastoids and visualized paranasal sinuses: Mild paranasal sinus mucosal thickening in the visualized sinuses. Skeleton: No acute or aggressive process. Upper chest: Motion limited assessment without definite consolidation the visualized lung apices. Other: In the posterior and upper left neck there is heterogeneous subcutaneous edema and edematous appearance of the paraspinal muscles, compatible with cellulitis, phlegmon, and myositis given the clinical history. This area of phlegmon measures up to approximately 4.0 x 2.4 x 4.8 cm. Evaluation for peripherally enhancing/drainable abscess is limited due to the absence of contrast. IMPRESSION: 1. In the posterior and upper left neck there is heterogeneous subcutaneous edema and edematous subjacent paraspinal muscles, compatible with cellulitis, phlegmon, and myositis given the clinical history. This area of phlegmon measures up to approximately 4.0 x 2.4 x 4.8 cm. Evaluation for peripherally enhancing/drainable abscess is limited due to the absence of contrast. If the patient is able, postcontrast imaging could further evaluate. Also, see concurrent ultrasound for additional evaluation for drainable collection. 2. Enlarged left upper cervical chain lymph nodes, nonspecific  but probably reactive given the above findings. Recommend clinical follow-up. 3. Milder more diffuse edema within the subcutaneous soft tissues, possibly representing anasarca. 4. Decreased attenuation of the blood pool, which can be seen with anemia. Electronically Signed   By: Margaretha Sheffield M.D.   On: 10/02/2021 11:09   US SOFT TISSUE HEAD & NECK (NON-THYROID)  Result Date: 10/02/2021 CLINICAL DATA:  Left head neck abscess with tenderness. Abscess drainage EXAM: ULTRASOUND OF HEAD/NECK SOFT TISSUES TECHNIQUE: Ultrasound examination of the head and neck soft tissues was performed in the area of clinical concern. COMPARISON:  Subsequent noncontrast neck CT. FINDINGS: In the left neck there is muscular increased echogenicity and expansion with regional echogenic fat stranding and enlarged lymph nodes with normal morphology. No drainable collection. IMPRESSION: Cellulitis, myositis, and adenitis in the left neck without visible abscess. Electronically Signed   By: Jorje Guild M.D.   On: 10/02/2021 11:58   DG Chest Portable 1 View  Result Date: 10/01/2021 CLINICAL DATA:  Suspected sepsis EXAM: PORTABLE CHEST 1 VIEW COMPARISON:  10/27/2019 FINDINGS: Cardiomegaly. No confluent airspace opacities or effusions. No acute bony abnormality. IMPRESSION: Cardiomegaly.  No active disease. Electronically Signed   By: Rolm Baptise M.D.  On: 10/01/2021 22:02    Review of Systems  Constitutional:  Positive for fatigue and fever.  Musculoskeletal:  Positive for neck pain.  Blood pressure 109/75, pulse (!) 102, temperature 99.2 F (37.3 C), temperature source Oral, resp. rate 18, height 5' 7"  (1.702 m), weight 63.5 kg, SpO2 100 %, unknown if currently breastfeeding. Physical Exam Constitutional:      Appearance: She is ill-appearing.  Neck:     Comments: Left posterolateral neck tender with abscess   Assessment/Plan: Left neck abscess -I think this needs drainage. Unfortunately she was given a regular  diet which she just ate.  Will plan to put this on for tomorrow am as this has been going on a week and now on abx. I dont think needs to be done urgently later. -discussed plan with she and her mother  Rolm Bookbinder 10/03/2021, 8:52 AM

## 2021-10-03 NOTE — Progress Notes (Signed)
PROGRESS NOTE    Lindsey Murillo  IWP:[id number] DOB: [birth year] DOA: 10/01/2021 PCP: Coral Spikes, DO    Brief Narrative:   Lindsey Murillo is a 26 y.o. female with past medical history significant for is a 26 year old female with past medical history significant for type 1 diabetes mellitus, stage IV CKD with nephrotic syndrome, anemia of chronic medical/renal disease, diabetic neuropathy, HTN who initially presented to [facility] ED on 5/25 with complaints of not feeling well, lower extremity edema, and posterior neck pain with associated drainage.  Mother at bedside assists with HPI given patient's resistance/evasive to questions.  Her mother reports that patient has not been feeling well for the last 2 weeks and noted a area of the posterior left neck that started draining 2 days prior.  Patient/mother denied any trauma to that region.  Pain is worse with any type of range of motion of the neck, nothing seems to make it better.  Mother put some ointment on it which offered her no relief.  In the ED, temperature 98.2 F, HR 102, RR 17, BP 96/67, SPO2 100% on room air.  Sodium 130, potassium 2.7, chloride 107, CO2 14, glucose 179, BUN 66, creatinine 4.76.  BNP 626.0.  WBC 38.9, hemoglobin 8.6, platelet count 330.  Lactic acid 0.7, procalcitonin 1.85.  INR 1.2.  Urinalysis with trace leukocytes, negative nitrite, rare bacteria, 11-20 WBCs.  Pregnancy test negative.  Chest x-ray with cardiomegaly, no active cardiopulmonary disease process.  CT soft tissue neck with area of phlegmon/abscess measuring 4 x 2.4 x 4.8 cm localized upper left neck with enlarged left upper cervical chain lymph nodes.  Blood cultures x2 were drawn.  Patient was started on vancomycin, cefepime, Flagyl.  Initially admitted to [facility].  Valuated by general surgery, Dr. Arnoldo Morale and requested transfer to a higher level of care due to multiple comorbidities.  Case was discussed with ENT, Dr. Benjamine Mola; and location was  out of scope of ENT and recommended transfer to [facility] for general surgery evaluation.  Patient was transferred to [facility] and admitted to the hospital service with general surgery in consultation.  Assessment & Plan:   Posterior neck abscess Severe sepsis sepsis, POA Patient presenting to ED with 2-week history of progressive fatigue and not feeling well with associated 2-day history of left posterior neck swelling with now purulent drainage.  Patient was afebrile but with leukocytosis, tachycardia, elevated procalcitonin and end organ dysfunction with acute on chronic renal failure.  Received IV albumin x3 doses CT soft tissue neck with area of phlegmon/abscess measuring 4 x 2.4 x 4.8 cm localized upper left neck with enlarged left upper cervical chain lymph nodes.  --General surgery following, appreciate assistance --WBC 38.9>37.9 --Blood cultures x2 5/25: Growth x2 days --Linezolid 600 mg IV every 12 hours --Cefepime 2 g IV every 24 hours --Oxycodone 5 mg PO q4h PRN moderate pain --dilaudid 0.5mg  IV q3h PRN severe pain --Pending I&D planned for 5/28 with general surgery --N.p.o. after midnight  Type 1 diabetes mellitus with peripheral neuropathy Hemoglobin A1c 9.8, poorly controlled.  Follows with endocrinology outpatient.  Reportedly on Semglee 12 units obviously daily, but likely inconsistent --Patient with episodes of hypoglycemia due to poor oral intake --Will hold Semglee for now --D5 NS at 50 mL/h --SSI for coverage --CBGs before every meal/at bedtime  Acute renal failure on CKD stage IV with nephrotic syndrome Creatinine 4.76 on admission with baseline 3.0.  Etiology likely secondary to severe sepsis in the  setting of poor oral intake/dehydration versus ATN with mild hypotension on admission.  Received IV albumin x3. --Cr 4.76>4.80 --starting IVF hydration with D5NS at 2mL/h --Avoid nephrotoxins, renal dose all medications --Renal panel daily  Essential  hypertension --Metoprolol tartrate 50 mg p.o. twice daily --Torsemide 60 mg p.o. twice daily, --Hold BP meds for SBP <100   Hyperlipidemia: Crestor 10 mg p.o. daily  Anemia of chronic medical/renal disease Follows with hematology outpatient at [facility], Dr. Jamey Reas. --Hgb 8.6>7.5 --Add anemia panel in the a.m. --CBC daily, transfuse for hemoglobin less than 7.0  Severe protein calorie malnutrition Body mass index is 21.93 kg/m.  -- Dietitian consult    DVT prophylaxis: heparin injection 5,000 Units Start: 10/02/21 1000 SCDs Start: 10/02/21 8101    Code Status: Full Code Family Communication: Updated mother are present at bedside this morning  Disposition Plan:  Level of care: Telemetry Medical Status is: Inpatient Remains inpatient appropriate because: Pending surgical incision and drainage of posterior neck abscess    Consultants:  General surgery  Procedures:  None  Antimicrobials:  Linezolid 5/27>> Cefepime 5/25>> Vancomycin 5/25 - 5/25 Metronidazole 5/25 - 5/25   Subjective: Patient seen examined bedside, resting comfortably.  Poorly interactive with conversation and mother at bedside gives 89 of history.  Continues with pain and drainage.  Seen by general surgery this morning and plan for operative management with incision and drainage tomorrow.  Remains on IV antibiotics.  No other specific complaints or concerns at this time.  Denies chest pain, no shortness of breath, no abdominal pain.  RN concerned about hypoglycemia in the setting of poor oral intake, discussed with him that we will discontinue the long-acting insulin for now and started on IV fluids given pending n.p.o. status after midnight for planned surgical intervention tomorrow.  Otherwise no other acute events overnight per nursing staff.  Objective: Vitals:   10/02/21 2012 10/02/21 2351 10/03/21 0709 10/03/21 0843  BP: 115/78 128/82 105/71 109/75  Pulse: (!) 103 100 99 (!) 102  Resp:  18 16 20 18   Temp: 98.5 F (36.9 C) 98.9 F (37.2 C)  99.2 F (37.3 C)  TempSrc:  Oral  Oral  SpO2: 98% 99% 98% 100%  Weight:      Height:        Intake/Output Summary (Last 24 hours) at 10/03/2021 1310 Last data filed at 10/03/2021 0701 Gross per 24 hour  Intake 1395 ml  Output 400 ml  Net 995 ml   Filed Weights   10/01/21 1950 10/02/21 0531  Weight: 64.5 kg 63.5 kg    Examination:  Physical Exam: GEN: NAD, alert and oriented x 3, chronically ill in appearance, appears older than stated age HEENT: NCAT, PERRL, EOMI, sclera clear, dry mucous membranes, posterior left lateral neck with fluctuance, tender to palpation with purulent drainage PULM: CTAB w/o wheezes/crackles, normal respiratory effort, on room air CV: RRR w/o M/G/R GI: abd soft, NTND, NABS, no R/G/M MSK: + peripheral edema, moves all extremities independently NEURO: CN II-XII intact, no focal deficits, sensation to light touch intact PSYCH: Depressed mood, flat affect Integumentary: posterior left lateral neck with fluctuance, tender to palpation with purulent drainage    Data Reviewed: I have personally reviewed following labs and imaging studies  CBC: Recent Labs  Lab 10/01/21 2053 10/03/21 0116  WBC 38.9* 37.9*  NEUTROABS  --  35.6*  HGB 8.6* 7.5*  HCT 26.2* 22.8*  MCV 85.6 85.4  PLT 330 751   Basic Metabolic Panel: Recent Labs  Lab 10/01/21 2053 10/03/21 0116  NA 130* 127*  K 2.7* 3.3*  CL 107 104  CO2 14* 13*  GLUCOSE 179* 68*  BUN 66* 64*  CREATININE 4.76* 4.80*  CALCIUM 7.7* 7.6*  MG  --  1.7   GFR: Estimated Creatinine Clearance: 17.1 mL/min (A) (by C-G formula based on SCr of 4.8 mg/dL (H)). Liver Function Tests: Recent Labs  Lab 10/01/21 2336 10/03/21 0116  AST 13* 11*  ALT 19 14  ALKPHOS 310* 242*  BILITOT 0.4 0.6  PROT 5.4* 5.4*  ALBUMIN 1.5* 1.7*   No results for input(s): LIPASE, AMYLASE in the last 168 hours. No results for input(s): AMMONIA in the last 168  hours. Coagulation Profile: Recent Labs  Lab 10/01/21 2205 10/03/21 0116  INR 1.2 1.4*   Cardiac Enzymes: No results for input(s): CKTOTAL, CKMB, CKMBINDEX, TROPONINI in the last 168 hours. BNP (last 3 results) No results for input(s): PROBNP in the last 8760 hours. HbA1C: No results for input(s): HGBA1C in the last 72 hours. CBG: Recent Labs  Lab 10/03/21 0337 10/03/21 0752 10/03/21 0844 10/03/21 1000 10/03/21 1216  GLUCAP 138* 50* 54* 135* 81   Lipid Profile: No results for input(s): CHOL, HDL, LDLCALC, TRIG, CHOLHDL, LDLDIRECT in the last 72 hours. Thyroid Function Tests: Recent Labs    10/03/21 0116  TSH 0.975   Anemia Panel: No results for input(s): VITAMINB12, FOLATE, FERRITIN, TIBC, IRON, RETICCTPCT in the last 72 hours. Sepsis Labs: Recent Labs  Lab 10/01/21 2205 10/01/21 2336 10/03/21 0116  PROCALCITON  --  1.85 1.86  LATICACIDVEN 0.7 0.7  --     Recent Results (from the past 240 hour(s))  Culture, blood (Routine x 2)     Status: None (Preliminary result)   Collection Time: 10/01/21 10:05 PM   Specimen: BLOOD  Result Value Ref Range Status   Specimen Description BLOOD RIGHT ANTECUBITAL  Final   Special Requests   Final    BOTTLES DRAWN AEROBIC AND ANAEROBIC Blood Culture adequate volume   Culture   Final    NO GROWTH 2 DAYS Performed at [facility], [address] [place], [place] [address]    Report Status PENDING  Incomplete  Culture, blood (Routine x 2)     Status: None (Preliminary result)   Collection Time: 10/01/21 10:05 PM   Specimen: BLOOD  Result Value Ref Range Status   Specimen Description BLOOD BLOOD LEFT HAND  Final   Special Requests   Final    BOTTLES DRAWN AEROBIC AND ANAEROBIC Blood Culture adequate volume   Culture   Final    NO GROWTH 2 DAYS Performed at [facility], [address] [place],  [address]    Report Status PENDING  Incomplete         Radiology Studies: CT SOFT TISSUE NECK WO CONTRAST  Result  Date: 10/02/2021 CLINICAL DATA:  Soft tissue swelling, infection suspected, neck xray done EXAM: CT NECK WITHOUT CONTRAST TECHNIQUE: Multidetector CT imaging of the neck was performed following the standard protocol without intravenous contrast. RADIATION DOSE REDUCTION: This exam was performed according to the departmental dose-optimization program which includes automated exposure control, adjustment of the mA and/or kV according to patient size and/or use of iterative reconstruction technique. COMPARISON:  None Available. FINDINGS: Pharynx and larynx: Normal. No mass or swelling. Salivary glands: No inflammation, mass, or stone. Thyroid: Normal. Lymph nodes: Enlarged left upper cervical chain lymph nodes. Vascular: Decreased attenuation of the blood pool. Limited intracranial: Negative. Visualized orbits: Negative.  Mastoids and visualized paranasal sinuses: Mild paranasal sinus mucosal thickening in the visualized sinuses. Skeleton: No acute or aggressive process. Upper chest: Motion limited assessment without definite consolidation the visualized lung apices. Other: In the posterior and upper left neck there is heterogeneous subcutaneous edema and edematous appearance of the paraspinal muscles, compatible with cellulitis, phlegmon, and myositis given the clinical history. This area of phlegmon measures up to approximately 4.0 x 2.4 x 4.8 cm. Evaluation for peripherally enhancing/drainable abscess is limited due to the absence of contrast. IMPRESSION: 1. In the posterior and upper left neck there is heterogeneous subcutaneous edema and edematous subjacent paraspinal muscles, compatible with cellulitis, phlegmon, and myositis given the clinical history. This area of phlegmon measures up to approximately 4.0 x 2.4 x 4.8 cm. Evaluation for peripherally enhancing/drainable abscess is limited due to the absence of contrast. If the patient is able, postcontrast imaging could further evaluate. Also, see concurrent  ultrasound for additional evaluation for drainable collection. 2. Enlarged left upper cervical chain lymph nodes, nonspecific but probably reactive given the above findings. Recommend clinical follow-up. 3. Milder more diffuse edema within the subcutaneous soft tissues, possibly representing anasarca. 4. Decreased attenuation of the blood pool, which can be seen with anemia. Electronically Signed   By: Margaretha Sheffield M.D.   On: 10/02/2021 11:09   US SOFT TISSUE HEAD & NECK (NON-THYROID)  Result Date: 10/02/2021 CLINICAL DATA:  Left head neck abscess with tenderness. Abscess drainage EXAM: ULTRASOUND OF HEAD/NECK SOFT TISSUES TECHNIQUE: Ultrasound examination of the head and neck soft tissues was performed in the area of clinical concern. COMPARISON:  Subsequent noncontrast neck CT. FINDINGS: In the left neck there is muscular increased echogenicity and expansion with regional echogenic fat stranding and enlarged lymph nodes with normal morphology. No drainable collection. IMPRESSION: Cellulitis, myositis, and adenitis in the left neck without visible abscess. Electronically Signed   By: Jorje Guild M.D.   On: 10/02/2021 11:58   DG Chest Portable 1 View  Result Date: 10/01/2021 CLINICAL DATA:  Suspected sepsis EXAM: PORTABLE CHEST 1 VIEW COMPARISON:  10/27/2019 FINDINGS: Cardiomegaly. No confluent airspace opacities or effusions. No acute bony abnormality. IMPRESSION: Cardiomegaly.  No active disease. Electronically Signed   By: Rolm Baptise M.D.   On: 10/01/2021 22:02        Scheduled Meds:  calcium acetate  667 mg Oral TID WC   dextrose  1 Tube Oral STAT   feeding supplement  237 mL Oral BID BM   heparin  5,000 Units Subcutaneous Q8H   insulin aspart  0-5 Units Subcutaneous QHS   insulin aspart  0-9 Units Subcutaneous TID WC   metoprolol tartrate  50 mg Oral BID   potassium chloride  40 mEq Oral BID   rosuvastatin  10 mg Oral Daily   torsemide  60 mg Oral BID   Continuous  Infusions:  ceFEPime (MAXIPIME) IV 2 g (10/02/21 2305)   dextrose 5 % and 0.9% NaCl     linezolid (ZYVOX) IV 600 mg (10/03/21 1214)     LOS: 1 day    Time spent: 51 minutes spent on chart review, discussion with nursing staff, consultants, updating family and interview/physical exam; more than 50% of that time was spent in counseling and/or coordination of care.    Lavon Bothwell J [place] DO [facility] Available via Epic secure chat 7am-7pm After these hours, please refer to coverage provider listed on [email or web address] 10/03/2021, 1:10 PM

## 2021-10-04 ENCOUNTER — Encounter (HOSPITAL_COMMUNITY): Payer: Self-pay | Admitting: Family Medicine

## 2021-10-04 ENCOUNTER — Encounter (HOSPITAL_COMMUNITY)
Admission: EM | Disposition: A | Discharge status: Home Health | Payer: Self-pay | Source: Home / Self Care | Attending: Internal Medicine

## 2021-10-04 ENCOUNTER — Inpatient Hospital Stay (HOSPITAL_COMMUNITY): Payer: 59 | Admitting: Anesthesiology

## 2021-10-04 ENCOUNTER — Other Ambulatory Visit: Payer: Self-pay

## 2021-10-04 ENCOUNTER — Inpatient Hospital Stay (HOSPITAL_COMMUNITY): Payer: 59

## 2021-10-04 DIAGNOSIS — D649 Anemia, unspecified: Secondary | ICD-10-CM

## 2021-10-04 DIAGNOSIS — N184 Chronic kidney disease, stage 4 (severe): Secondary | ICD-10-CM | POA: Diagnosis not present

## 2021-10-04 DIAGNOSIS — A419 Sepsis, unspecified organism: Secondary | ICD-10-CM | POA: Diagnosis not present

## 2021-10-04 DIAGNOSIS — I10 Essential (primary) hypertension: Secondary | ICD-10-CM

## 2021-10-04 DIAGNOSIS — E876 Hypokalemia: Secondary | ICD-10-CM | POA: Diagnosis not present

## 2021-10-04 DIAGNOSIS — N289 Disorder of kidney and ureter, unspecified: Secondary | ICD-10-CM | POA: Diagnosis not present

## 2021-10-04 DIAGNOSIS — L0211 Cutaneous abscess of neck: Secondary | ICD-10-CM

## 2021-10-04 DIAGNOSIS — N61 Mastitis without abscess: Secondary | ICD-10-CM | POA: Diagnosis present

## 2021-10-04 DIAGNOSIS — N611 Abscess of the breast and nipple: Secondary | ICD-10-CM

## 2021-10-04 HISTORY — PX: IRRIGATION AND DEBRIDEMENT ABSCESS: SHX5252

## 2021-10-04 LAB — RENAL FUNCTION PANEL
Albumin: <1.5 g/dL — ABNORMAL LOW (ref 3.5–5.0)
Anion gap: 8 (ref 5–15)
BUN: 63 mg/dL — ABNORMAL HIGH (ref 6–20)
CO2: 12 mmol/L — ABNORMAL LOW (ref 22–32)
Calcium: 7.4 mg/dL — ABNORMAL LOW (ref 8.9–10.3)
Chloride: 107 mmol/L (ref 98–111)
Creatinine, Ser: 4.9 mg/dL — ABNORMAL HIGH (ref 0.44–1.00)
GFR, Estimated: 12 mL/min — ABNORMAL LOW (ref >60)
Glucose, Bld: 116 mg/dL — ABNORMAL HIGH (ref 70–99)
Phosphorus: 5.2 mg/dL — ABNORMAL HIGH (ref 2.5–4.6)
Potassium: 5 mmol/L (ref 3.5–5.1)
Sodium: 127 mmol/L — ABNORMAL LOW (ref 135–145)

## 2021-10-04 LAB — GLUCOSE, CAPILLARY
Glucose-Capillary: 121 mg/dL — ABNORMAL HIGH (ref 70–99)
Glucose-Capillary: 116 mg/dL — ABNORMAL HIGH (ref 70–99)
Glucose-Capillary: 120 mg/dL — ABNORMAL HIGH (ref 70–99)
Glucose-Capillary: 115 mg/dL — ABNORMAL HIGH (ref 70–99)
Glucose-Capillary: 119 mg/dL — ABNORMAL HIGH (ref 70–99)
Glucose-Capillary: 198 mg/dL — ABNORMAL HIGH (ref 70–99)

## 2021-10-04 LAB — RETICULOCYTES
Immature Retic Fract: 42.3 % — ABNORMAL HIGH (ref 2.3–15.9)
RBC.: 2.45 MIL/uL — ABNORMAL LOW (ref 3.87–5.11)
Retic Count, Absolute: 80.4 10*3/uL (ref 19.0–186.0)
Retic Ct Pct: 3.3 % — ABNORMAL HIGH (ref 0.4–3.1)

## 2021-10-04 LAB — CBC
HCT: 21.9 % — ABNORMAL LOW (ref 36.0–46.0)
Hemoglobin: 7.1 g/dL — ABNORMAL LOW (ref 12.0–15.0)
MCH: 28.4 pg (ref 26.0–34.0)
MCHC: 32.4 g/dL (ref 30.0–36.0)
MCV: 87.6 fL (ref 80.0–100.0)
Platelets: 285 10*3/uL (ref 150–400)
RBC: 2.5 MIL/uL — ABNORMAL LOW (ref 3.87–5.11)
RDW: 16.3 % — ABNORMAL HIGH (ref 11.5–15.5)
WBC: 35.8 10*3/uL — ABNORMAL HIGH (ref 4.0–10.5)
nRBC: 0.1 % (ref 0.0–0.2)

## 2021-10-04 LAB — IRON AND TIBC: Iron: 22 ug/dL — ABNORMAL LOW (ref 28–170)

## 2021-10-04 LAB — FERRITIN: Ferritin: 1192 ng/mL — ABNORMAL HIGH (ref 11–307)

## 2021-10-04 LAB — FOLATE: Folate: 11.6 ng/mL

## 2021-10-04 LAB — VITAMIN B12: Vitamin B-12: 2417 pg/mL — ABNORMAL HIGH (ref 180–914)

## 2021-10-04 SURGERY — IRRIGATION AND DEBRIDEMENT ABSCESS
Anesthesia: General

## 2021-10-04 MED ORDER — PHENYLEPHRINE 80 MCG/ML (10ML) SYRINGE FOR IV PUSH (FOR BLOOD PRESSURE SUPPORT)
PREFILLED_SYRINGE | INTRAVENOUS | Status: DC | PRN
  Administered 2021-10-04 (×3): 80 ug via INTRAVENOUS
  Administered 2021-10-04: 160 ug via INTRAVENOUS

## 2021-10-04 MED ORDER — OXYCODONE-ACETAMINOPHEN 5-325 MG PO TABS
1.0000 | ORAL_TABLET | ORAL | Status: DC | PRN
  Administered 2021-10-05 (×2): 1 via ORAL
  Administered 2021-10-06: 2 via ORAL
  Filled 2021-10-04: qty 2
  Filled 2021-10-04 (×2): qty 1

## 2021-10-04 MED ORDER — SUGAMMADEX SODIUM 200 MG/2ML IV SOLN
INTRAVENOUS | Status: DC | PRN
  Administered 2021-10-04: 200 mg via INTRAVENOUS

## 2021-10-04 MED ORDER — FENTANYL CITRATE (PF) 250 MCG/5ML IJ SOLN
INTRAMUSCULAR | Status: AC
  Filled 2021-10-04: qty 5

## 2021-10-04 MED ORDER — ONDANSETRON HCL 4 MG/2ML IJ SOLN: 4.0000 mg | Freq: Once | INTRAMUSCULAR | Status: DC | PRN

## 2021-10-04 MED ORDER — ACETAMINOPHEN 500 MG PO TABS
1000.0000 mg | ORAL_TABLET | Freq: Once | ORAL | Status: DC
  Filled 2021-10-04: qty 2

## 2021-10-04 MED ORDER — KETOROLAC TROMETHAMINE 30 MG/ML IJ SOLN
INTRAMUSCULAR | Status: AC
  Filled 2021-10-04: qty 1

## 2021-10-04 MED ORDER — 0.9 % SODIUM CHLORIDE (POUR BTL) OPTIME
TOPICAL | Status: DC | PRN
  Administered 2021-10-04: 1000 mL

## 2021-10-04 MED ORDER — ROCURONIUM BROMIDE 10 MG/ML (PF) SYRINGE
PREFILLED_SYRINGE | INTRAVENOUS | Status: DC | PRN
  Administered 2021-10-04: 50 mg via INTRAVENOUS

## 2021-10-04 MED ORDER — PHENYLEPHRINE HCL-NACL 20-0.9 MG/250ML-% IV SOLN
INTRAVENOUS | Status: DC | PRN
  Administered 2021-10-04: 40 ug/min via INTRAVENOUS

## 2021-10-04 MED ORDER — PROPOFOL 10 MG/ML IV BOLUS
INTRAVENOUS | Status: AC
  Filled 2021-10-04: qty 20

## 2021-10-04 MED ORDER — INSULIN ASPART 100 UNIT/ML IJ SOLN: 0.0000 [IU] | INTRAMUSCULAR | Status: DC | PRN

## 2021-10-04 MED ORDER — PHENYLEPHRINE 80 MCG/ML (10ML) SYRINGE FOR IV PUSH (FOR BLOOD PRESSURE SUPPORT)
PREFILLED_SYRINGE | INTRAVENOUS | Status: AC
  Filled 2021-10-04: qty 10

## 2021-10-04 MED ORDER — CHLORHEXIDINE GLUCONATE 0.12 % MT SOLN
OROMUCOSAL | Status: AC
  Administered 2021-10-04: 15 mL via OROMUCOSAL
  Filled 2021-10-04: qty 15

## 2021-10-04 MED ORDER — PROPOFOL 10 MG/ML IV BOLUS
INTRAVENOUS | Status: DC | PRN
  Administered 2021-10-04: 100 mg via INTRAVENOUS

## 2021-10-04 MED ORDER — SODIUM CHLORIDE 0.9 % IV SOLN
1.0000 g | INTRAVENOUS | Status: DC
  Administered 2021-10-04: 1 g via INTRAVENOUS
  Filled 2021-10-04 (×2): qty 10

## 2021-10-04 MED ORDER — ONDANSETRON HCL 4 MG/2ML IJ SOLN
INTRAMUSCULAR | Status: DC | PRN
Start: 2021-10-04 — End: 2021-10-04
  Administered 2021-10-04: 4 mg via INTRAVENOUS

## 2021-10-04 MED ORDER — SODIUM CHLORIDE 0.9 % IV SOLN: INTRAVENOUS | Status: DC

## 2021-10-04 MED ORDER — MIDAZOLAM HCL 2 MG/2ML IJ SOLN
INTRAMUSCULAR | Status: DC | PRN
  Administered 2021-10-04: 1 mg via INTRAVENOUS

## 2021-10-04 MED ORDER — FENTANYL CITRATE (PF) 100 MCG/2ML IJ SOLN: 25.0000 ug | INTRAMUSCULAR | Status: DC | PRN

## 2021-10-04 MED ORDER — MIDAZOLAM HCL 2 MG/2ML IJ SOLN
INTRAMUSCULAR | Status: AC
  Filled 2021-10-04: qty 2

## 2021-10-04 MED ORDER — FENTANYL CITRATE (PF) 250 MCG/5ML IJ SOLN
INTRAMUSCULAR | Status: DC | PRN
Start: 2021-10-04 — End: 2021-10-04
  Administered 2021-10-04 (×4): 50 ug via INTRAVENOUS

## 2021-10-04 MED ORDER — ORAL CARE MOUTH RINSE: 15.0000 mL | Freq: Once | OROMUCOSAL | Status: AC

## 2021-10-04 MED ORDER — LIDOCAINE 2% (20 MG/ML) 5 ML SYRINGE
INTRAMUSCULAR | Status: DC | PRN
  Administered 2021-10-04: 80 mg via INTRAVENOUS

## 2021-10-04 MED ORDER — CHLORHEXIDINE GLUCONATE 0.12 % MT SOLN: 15.0000 mL | Freq: Once | OROMUCOSAL | Status: AC

## 2021-10-04 MED ORDER — HYDROMORPHONE HCL 1 MG/ML IJ SOLN
1.0000 mg | INTRAMUSCULAR | Status: DC | PRN
  Administered 2021-10-05 – 2021-10-07 (×4): 1 mg via INTRAVENOUS
  Filled 2021-10-04 (×7): qty 1

## 2021-10-04 SURGICAL SUPPLY — 24 items
CANISTER SUCT 3000ML PPV (MISCELLANEOUS) ×2 IMPLANT
COVER SURGICAL LIGHT HANDLE (MISCELLANEOUS) ×2 IMPLANT
DRAPE LAPAROTOMY 100X72 PEDS (DRAPES) ×1 IMPLANT
ELECT REM PT RETURN 9FT ADLT (ELECTROSURGICAL) ×2
ELECTRODE REM PT RTRN 9FT ADLT (ELECTROSURGICAL) ×1 IMPLANT
GAUZE PACKING IODOFORM 1/4X15 (PACKING) ×1 IMPLANT
GAUZE SPONGE 4X4 12PLY STRL (GAUZE/BANDAGES/DRESSINGS) ×1 IMPLANT
GLOVE BIO SURGEON STRL SZ7.5 (GLOVE) ×2 IMPLANT
GLOVE BIOGEL PI IND STRL 8 (GLOVE) ×1 IMPLANT
GLOVE BIOGEL PI INDICATOR 8 (GLOVE) ×1
GOWN STRL REUS W/ TWL LRG LVL3 (GOWN DISPOSABLE) ×2 IMPLANT
GOWN STRL REUS W/ TWL XL LVL3 (GOWN DISPOSABLE) ×1 IMPLANT
GOWN STRL REUS W/TWL LRG LVL3 (GOWN DISPOSABLE) ×2
GOWN STRL REUS W/TWL XL LVL3 (GOWN DISPOSABLE) ×2
KIT BASIN OR (CUSTOM PROCEDURE TRAY) ×2 IMPLANT
KIT TURNOVER KIT B (KITS) ×2 IMPLANT
NS IRRIG 1000ML POUR BTL (IV SOLUTION) ×2 IMPLANT
PACK GENERAL/GYN (CUSTOM PROCEDURE TRAY) ×2 IMPLANT
PAD ARMBOARD 7.5X6 YLW CONV (MISCELLANEOUS) ×4 IMPLANT
PENCIL SMOKE EVACUATOR (MISCELLANEOUS) ×2 IMPLANT
SWAB COLLECTION DEVICE MRSA (MISCELLANEOUS) ×1 IMPLANT
SWAB CULTURE ESWAB REG 1ML (MISCELLANEOUS) ×1 IMPLANT
TOWEL GREEN STERILE (TOWEL DISPOSABLE) ×2 IMPLANT
TOWEL GREEN STERILE FF (TOWEL DISPOSABLE) ×2 IMPLANT

## 2021-10-04 NOTE — Progress Notes (Signed)
PROGRESS NOTE    Lindsey Murillo  EPP:[id number] DOB: 1995-11-10 DOA: 10/01/2021 PCP: Coral Spikes, DO    Brief Narrative:   Lindsey Murillo is a 26 y.o. female with past medical history significant for is a 26 year old female with past medical history significant for type 1 diabetes mellitus, stage IV CKD with nephrotic syndrome, anemia of chronic medical/renal disease, diabetic neuropathy, HTN who initially presented to [facility] ED on 5/25 with complaints of not feeling well, lower extremity edema, and posterior neck pain with associated drainage.  Mother at bedside assists with HPI given patient's resistance/evasive to questions.  Her mother reports that patient has not been feeling well for the last 2 weeks and noted a area of the posterior left neck that started draining 2 days prior.  Patient/mother denied any trauma to that region.  Pain is worse with any type of range of motion of the neck, nothing seems to make it better.  Mother put some ointment on it which offered her no relief.  In the ED, temperature 98.2 F, HR 102, RR 17, BP 96/67, SPO2 100% on room air.  Sodium 130, potassium 2.7, chloride 107, CO2 14, glucose 179, BUN 66, creatinine 4.76.  BNP 626.0.  WBC 38.9, hemoglobin 8.6, platelet count 330.  Lactic acid 0.7, procalcitonin 1.85.  INR 1.2.  Urinalysis with trace leukocytes, negative nitrite, rare bacteria, 11-20 WBCs.  Pregnancy test negative.  Chest x-ray with cardiomegaly, no active cardiopulmonary disease process.  CT soft tissue neck with area of phlegmon/abscess measuring 4 x 2.4 x 4.8 cm localized upper left neck with enlarged left upper cervical chain lymph nodes.  Blood cultures x2 were drawn.  Patient was started on vancomycin, cefepime, Flagyl.  Initially admitted to [facility].  Valuated by general surgery, Dr. Arnoldo Morale and requested transfer to a higher level of care due to multiple comorbidities.  Case was discussed with ENT, Dr. Benjamine Mola; and location was  out of scope of ENT and recommended transfer to [facility] for general surgery evaluation.  Patient was transferred to [facility] and admitted to the hospital service with general surgery in consultation.  Assessment & Plan:   Posterior neck abscess Severe sepsis sepsis, POA Patient presenting to ED with 2-week history of progressive fatigue and not feeling well with associated 2-day history of left posterior neck swelling with now purulent drainage.  Patient was afebrile but with leukocytosis, tachycardia, elevated procalcitonin and end organ dysfunction with acute on chronic renal failure.  Received IV albumin x3 doses CT soft tissue neck with area of phlegmon/abscess measuring 4 x 2.4 x 4.8 cm localized upper left neck with enlarged left upper cervical chain lymph nodes.  Patient underwent I&D by general surgery, Dr. Rosendo Gros on 10/04/2021. --General surgery following, appreciate assistance --WBC 38.9>37.9>35.8 --Blood cultures x2 5/25: Growth x 3 days --Operative culture 5/28: Pending --Linezolid 600 mg IV every 12 hours --Cefepime 2 g IV every 24 hours --Oxycodone 5 mg PO q4h PRN moderate pain --dilaudid 0.5mg  IV q3h PRN severe pain --CBC daily  Right breast abscess --Ultrasound soft tissue: Pending --Antibiotics as above --Will likely need I&D by general surgery  Type 1 diabetes mellitus with peripheral neuropathy Episode of hypoglycemia Hemoglobin A1c 9.8, poorly controlled.  Follows with endocrinology outpatient.  Reportedly on Semglee 12 units obviously daily, but likely inconsistent use --Patient with episodes of hypoglycemia due to poor oral intake --Will hold Semglee for now --D5 NS at 50 mL/h --SSI for coverage --CBGs before every meal/at  bedtime  Acute renal failure on CKD stage IV with nephrotic syndrome Creatinine 4.76 on admission with baseline 3.0.  Etiology likely secondary to severe sepsis in the setting of poor oral intake/dehydration versus ATN with mild  hypotension on admission.  Received IV albumin x3. --Cr 4.76>4.80>4.90 --IVF hydration with D5NS at 42mL/h --Avoid nephrotoxins, renal dose all medications --Renal panel daily  Essential hypertension --Metoprolol tartrate 50 mg p.o. twice daily --Torsemide 60 mg p.o. twice daily --Hold BP meds for SBP <100   Hyperlipidemia: Crestor 10 mg p.o. daily  Anemia of chronic medical/renal disease Follows with hematology outpatient at [facility], Dr. Jamey Reas.  --Hgb 8.6>7.5>7.1 --Anemia panel: Pending --CBC daily, transfuse for hemoglobin less than 7.0  Severe protein calorie malnutrition Body mass index is 21.92 kg/m.  --Dietitian consult    DVT prophylaxis: heparin injection 5,000 Units Start: 10/02/21 1000 SCDs Start: 10/02/21 5284    Code Status: Full Code Family Communication: Updated mother are present at bedside this morning  Disposition Plan:  Level of care: Telemetry Medical Status is: Inpatient Remains inpatient appropriate because: Pending surgical incision and drainage of posterior neck abscess    Consultants:  General surgery  Procedures:  I&D posterior neck abscess, Dr. Rosendo Gros 10/04/2021  Antimicrobials:  Linezolid 5/27>> Cefepime 5/25>> Vancomycin 5/25 - 5/25 Metronidazole 5/25 - 5/25   Subjective: Patient seen examined bedside, resting comfortably.  RN from the OR following I&D of neck abscess.  Mother present.  Poorly interactive once again.  Received notice by RN earlier this morning to evaluate possible right breast abscess.  Mother unaware as well.  Discussed with general surgery, Dr. Donne Hazel, will obtain ultrasound soft tissue and will likely need I&D.   Remains on IV antibiotics.  No other specific complaints or concerns at this time.  Denies chest pain, no shortness of breath, no abdominal pain.  No other acute concerns overnight per nursing staff.  Objective: Vitals:   10/04/21 0855 10/04/21 0910 10/04/21 0925 10/04/21 0940  BP: 107/67  111/75 118/89 (!) 122/91  Pulse: 94 95 97 97  Resp: 12 18 13 11   Temp: 98 F (36.7 C)   98 F (36.7 C)  TempSrc:      SpO2: 92% 100% 99% 96%  Weight:      Height:        Intake/Output Summary (Last 24 hours) at 10/04/2021 1227 Last data filed at 10/04/2021 1324 Gross per 24 hour  Intake 1484.26 ml  Output 300 ml  Net 1184.26 ml   Filed Weights   10/01/21 1950 10/02/21 0531 10/04/21 0703  Weight: 64.5 kg 63.5 kg 63.5 kg    Examination:  Physical Exam: GEN: NAD, alert and oriented x 3, chronically ill in appearance, appears older than stated age HEENT: NCAT, PERRL, EOMI, sclera clear, dry mucous membranes, posterior left lateral neck with fluctuance, tender to palpation with purulent drainage PULM: CTAB w/o wheezes/crackles, normal respiratory effort, on room air Breast: Right breast with fluctuance and inflammation medial-inferior quadrant, NTTP CV: RRR w/o M/G/R GI: abd soft, NTND, NABS, no R/G/M MSK: + peripheral edema, moves all extremities independently NEURO: CN II-XII intact, no focal deficits, sensation to light touch intact PSYCH: Depressed mood, flat affect Integumentary: posterior left lateral neck with fluctuance, tender to palpation with purulent drainage    Data Reviewed: I have personally reviewed following labs and imaging studies  CBC: Recent Labs  Lab 10/01/21 2053 10/03/21 0116 10/04/21 0051  WBC 38.9* 37.9* 35.8*  NEUTROABS  --  35.6*  --  HGB 8.6* 7.5* 7.1*  HCT 26.2* 22.8* 21.9*  MCV 85.6 85.4 87.6  PLT 330 315 671   Basic Metabolic Panel: Recent Labs  Lab 10/01/21 2053 10/03/21 0116 10/04/21 0051  NA 130* 127* 127*  K 2.7* 3.3* 5.0  CL 107 104 107  CO2 14* 13* 12*  GLUCOSE 179* 68* 116*  BUN 66* 64* 63*  CREATININE 4.76* 4.80* 4.90*  CALCIUM 7.7* 7.6* 7.4*  MG  --  1.7  --   PHOS  --   --  5.2*   GFR: Estimated Creatinine Clearance: 16.8 mL/min (A) (by C-G formula based on SCr of 4.9 mg/dL (H)). Liver Function  Tests: Recent Labs  Lab 10/01/21 2336 10/03/21 0116 10/04/21 0051  AST 13* 11*  --   ALT 19 14  --   ALKPHOS 310* 242*  --   BILITOT 0.4 0.6  --   PROT 5.4* 5.4*  --   ALBUMIN 1.5* 1.7* <1.5*   No results for input(s): LIPASE, AMYLASE in the last 168 hours. No results for input(s): AMMONIA in the last 168 hours. Coagulation Profile: Recent Labs  Lab 10/01/21 2205 10/03/21 0116  INR 1.2 1.4*   Cardiac Enzymes: No results for input(s): CKTOTAL, CKMB, CKMBINDEX, TROPONINI in the last 168 hours. BNP (last 3 results) No results for input(s): PROBNP in the last 8760 hours. HbA1C: No results for input(s): HGBA1C in the last 72 hours. CBG: Recent Labs  Lab 10/03/21 2152 10/04/21 0239 10/04/21 0558 10/04/21 0856 10/04/21 1201  GLUCAP 86 121* 116* 120* 115*   Lipid Profile: No results for input(s): CHOL, HDL, LDLCALC, TRIG, CHOLHDL, LDLDIRECT in the last 72 hours. Thyroid Function Tests: Recent Labs    10/03/21 0116  TSH 0.975   Anemia Panel: Recent Labs    10/04/21 0051  FOLATE 11.6  RETICCTPCT 3.3*   Sepsis Labs: Recent Labs  Lab 10/01/21 2205 10/01/21 2336 10/03/21 0116  PROCALCITON  --  1.85 1.86  LATICACIDVEN 0.7 0.7  --     Recent Results (from the past 240 hour(s))  Culture, blood (Routine x 2)     Status: None (Preliminary result)   Collection Time: 10/01/21 10:05 PM   Specimen: BLOOD  Result Value Ref Range Status   Specimen Description BLOOD RIGHT ANTECUBITAL  Final   Special Requests   Final    BOTTLES DRAWN AEROBIC AND ANAEROBIC Blood Culture adequate volume   Culture   Final    NO GROWTH 3 DAYS Performed at [facility], [address], [place], [place] [address]    Report Status PENDING  Incomplete  Culture, blood (Routine x 2)     Status: None (Preliminary result)   Collection Time: 10/01/21 10:05 PM   Specimen: BLOOD  Result Value Ref Range Status   Specimen Description BLOOD BLOOD LEFT HAND  Final   Special Requests   Final     BOTTLES DRAWN AEROBIC AND ANAEROBIC Blood Culture adequate volume   Culture   Final    NO GROWTH 3 DAYS Performed at [facility], [address] [place], [place] [address]    Report Status PENDING  Incomplete  Surgical pcr screen     Status: Abnormal   Collection Time: 10/03/21  1:00 PM   Specimen: Nasal Mucosa; Nasal Swab  Result Value Ref Range Status   MRSA, PCR NEGATIVE NEGATIVE Final   Staphylococcus aureus POSITIVE (A) NEGATIVE Final    Comment: (NOTE) The Xpert SA Assay (FDA approved for NASAL specimens in patients 22  years of age and older), is one component of a comprehensive surveillance program. It is not intended to diagnose infection nor to guide or monitor treatment. Performed at [facility], [place] [address], [place], [place] [address]          Radiology Studies: No results found.      Scheduled Meds:  calcium acetate  667 mg Oral TID WC   feeding supplement  237 mL Oral BID BM   heparin  5,000 Units Subcutaneous Q8H   insulin aspart  0-5 Units Subcutaneous QHS   insulin aspart  0-9 Units Subcutaneous TID WC   metoprolol tartrate  50 mg Oral BID   mupirocin ointment  1 application. Nasal BID   potassium chloride  40 mEq Oral BID   rosuvastatin  10 mg Oral Daily   torsemide  60 mg Oral BID   Continuous Infusions:  ceFEPime (MAXIPIME) IV     dextrose 5 % and 0.9% NaCl 50 mL/hr at 10/03/21 1423   linezolid (ZYVOX) IV 600 mg (10/03/21 2314)     LOS: 2 days    Time spent: 48 minutes spent on chart review, discussion with nursing staff, consultants, updating family and interview/physical exam; more than 50% of that time was spent in counseling and/or coordination of care.    Rhylen Shaheen J [place] DO [facility] Available via Epic secure chat 7am-7pm After these hours, please refer to coverage provider listed on [email or web address] 10/04/2021, 12:27 PM

## 2021-10-04 NOTE — Transfer of Care (Signed)
Immediate Anesthesia Transfer of Care Note  Patient: Lindsey Murillo  Procedure(s) Performed: IRRIGATION AND DEBRIDEMENT NECK ABSCESS  Patient Location: PACU  Anesthesia Type:General  Level of Consciousness: drowsy  Airway & Oxygen Therapy: Patient Spontanous Breathing and Patient connected to face mask oxygen  Post-op Assessment: Report given to RN, Post -op Vital signs reviewed and stable and Patient moving all extremities  Post vital signs: Reviewed and stable  Last Vitals:  Vitals Value Taken Time  BP 107/67 10/04/21 0854  Temp    Pulse 94 10/04/21 0858  Resp 8 10/04/21 0858  SpO2 100 % 10/04/21 0858  Vitals shown include unvalidated device data.  Last Pain:  Vitals:   10/04/21 0709  TempSrc: Oral  PainSc:          Complications: No notable events documented.

## 2021-10-04 NOTE — Op Note (Signed)
10/04/2021  8:31 AM  PATIENT:  Lindsey Murillo  26 y.o. female  PRE-OPERATIVE DIAGNOSIS:  POSTERIOR NECK ABSCESS  POST-OPERATIVE DIAGNOSIS: Same  PROCEDURE:  Procedure(s): IRRIGATION AND DEBRIDEMENT NECK ABSCESS (N/A)  SURGEON:  Surgeon(s) and Role:    * Ralene Ok, MD - Primary  ANESTHESIA:   general  EBL:  minimal   BLOOD ADMINISTERED:none  DRAINS: none   LOCAL MEDICATIONS USED:  NONE  SPECIMEN:  Source of Specimen: Neck abscess  DISPOSITION OF SPECIMEN: Microbiology  COUNTS:  YES  TOURNIQUET:  * No tourniquets in log *  DICTATION: .Dragon Dictation Indication procedure: Patient is a 26 year old female, with a history of multiple a history of a left neck abscess.  Patient was studied with a CT scan was found to have a large abscess cavity deep to the skin.  There was some drainage at the skin area as well as erythema.  Patient was taken back to the operating urgently for I&D of neck abscess.  Findings: Patient had approximately a 4 x 3 cm abscess cavity.  Both aerobic and anaerobic cultures were obtained.  There was packed with quarter inch iodoform gauze.  Details of procedure: After the patient was consented she was taken back to the OR and placed in supine position with bilateral SCDs in place.  She underwent general endotracheal anesthesia.  Patient was then placed in the prone position.  Patient was then prepped and draped in standard fashion.  A timeout was called and all facts verified.  At this time an oblique incision was made over the abscess cavity.  Large amount of purulence was expressed.  Both aerobic anaerobic cultures were obtained.  At this time all loculations were broken up with hemostat.  The final cavity was approximately 4 x 3 cm.  At this time the area was irrigated out with sterile saline.  Hemostasis was achieved using Bovie cautery.  At this time the area was packed with quarter inch iodoform gauze.  This was then dressed with 4 x  4's, and tape.  Patient tolerated procedure well was taken to the recovery in stable condition.   PLAN OF CARE: Admit to inpatient   PATIENT DISPOSITION:  PACU - hemodynamically stable.   Delay start of Pharmacological VTE agent (>24hrs) due to surgical blood loss or risk of bleeding: not applicable

## 2021-10-04 NOTE — Progress Notes (Signed)
Day of Surgery   Subjective/Chief Complaint: Pt with min changes today R breast inflammation per RN   Objective: Vital signs in last 24 hours: Temp:  [98.7 F (37.1 C)-99.9 F (37.7 C)] 98.8 F (37.1 C) (05/28 0709) Pulse Rate:  [100-107] 107 (05/28 0709) Resp:  [16-20] 18 (05/28 0709) BP: (95-117)/(67-82) 117/82 (05/28 0709) SpO2:  [96 %-100 %] 96 % (05/28 0709) Weight:  [63.5 kg] 63.5 kg (05/28 0703) Last BM Date : 10/02/21  Intake/Output from previous day: 05/27 0701 - 05/28 0700 In: 1244.3 [P.O.:900; I.V.:44.3; IV Piggyback:300] Out: 700 [Urine:700] Intake/Output this shift: No intake/output data recorded.  PE:  Constitutional: No acute distress, conversant, appears states age. Neck: Left neck abscess Eyes: Anicteric sclerae, moist conjunctiva, no lid lag Breast: right breast inflammation, nttp Lungs: Clear to auscultation bilaterally, normal respiratory effort CV: regular rate and rhythm, no murmurs, no peripheral edema, pedal pulses 2+ GI: Soft, no masses or hepatosplenomegaly, non-tender to palpation Skin: No rashes, palpation reveals normal turgor Psychiatric: appropriate judgment and insight, oriented to person, place, and time   Lab Results:  Recent Labs    10/03/21 0116 10/04/21 0051  WBC 37.9* 35.8*  HGB 7.5* 7.1*  HCT 22.8* 21.9*  PLT 315 285   BMET Recent Labs    10/03/21 0116 10/04/21 0051  NA 127* 127*  K 3.3* 5.0  CL 104 107  CO2 13* 12*  GLUCOSE 68* 116*  BUN 64* 63*  CREATININE 4.80* 4.90*  CALCIUM 7.6* 7.4*   PT/INR Recent Labs    10/01/21 2205 10/03/21 0116  LABPROT 15.3* 16.8*  INR 1.2 1.4*   ABG No results for input(s): PHART, HCO3 in the last 72 hours.  Invalid input(s): PCO2, PO2  Studies/Results: CT SOFT TISSUE NECK WO CONTRAST  Result Date: 10/02/2021 CLINICAL DATA:  Soft tissue swelling, infection suspected, neck xray done EXAM: CT NECK WITHOUT CONTRAST TECHNIQUE: Multidetector CT imaging of the neck was  performed following the standard protocol without intravenous contrast. RADIATION DOSE REDUCTION: This exam was performed according to the departmental dose-optimization program which includes automated exposure control, adjustment of the mA and/or kV according to patient size and/or use of iterative reconstruction technique. COMPARISON:  None Available. FINDINGS: Pharynx and larynx: Normal. No mass or swelling. Salivary glands: No inflammation, mass, or stone. Thyroid: Normal. Lymph nodes: Enlarged left upper cervical chain lymph nodes. Vascular: Decreased attenuation of the blood pool. Limited intracranial: Negative. Visualized orbits: Negative. Mastoids and visualized paranasal sinuses: Mild paranasal sinus mucosal thickening in the visualized sinuses. Skeleton: No acute or aggressive process. Upper chest: Motion limited assessment without definite consolidation the visualized lung apices. Other: In the posterior and upper left neck there is heterogeneous subcutaneous edema and edematous appearance of the paraspinal muscles, compatible with cellulitis, phlegmon, and myositis given the clinical history. This area of phlegmon measures up to approximately 4.0 x 2.4 x 4.8 cm. Evaluation for peripherally enhancing/drainable abscess is limited due to the absence of contrast. IMPRESSION: 1. In the posterior and upper left neck there is heterogeneous subcutaneous edema and edematous subjacent paraspinal muscles, compatible with cellulitis, phlegmon, and myositis given the clinical history. This area of phlegmon measures up to approximately 4.0 x 2.4 x 4.8 cm. Evaluation for peripherally enhancing/drainable abscess is limited due to the absence of contrast. If the patient is able, postcontrast imaging could further evaluate. Also, see concurrent ultrasound for additional evaluation for drainable collection. 2. Enlarged left upper cervical chain lymph nodes, nonspecific but probably reactive given the above findings.  Recommend clinical follow-up. 3. Milder more diffuse edema within the subcutaneous soft tissues, possibly representing anasarca. 4. Decreased attenuation of the blood pool, which can be seen with anemia. Electronically Signed   By: Margaretha Sheffield M.D.   On: 10/02/2021 11:09    Anti-infectives: Anti-infectives (From admission, onward)    Start     Dose/Rate Route Frequency Ordered Stop   10/03/21 2200  vancomycin (VANCOREADY) IVPB 750 mg/150 mL  Status:  Discontinued        750 mg 150 mL/hr over 60 Minutes Intravenous Every 48 hours 10/02/21 0033 10/02/21 1144   10/03/21 1000  [MAR Hold]  linezolid (ZYVOX) IVPB 600 mg        (MAR Hold since Sun 10/04/2021 at 0652.Hold Reason: Transfer to a Procedural area)   600 mg 300 mL/hr over 60 Minutes Intravenous Every 12 hours 10/02/21 1144     10/02/21 2200  [MAR Hold]  ceFEPIme (MAXIPIME) 2 g in sodium chloride 0.9 % 100 mL IVPB        (MAR Hold since Sun 10/04/2021 at 0652.Hold Reason: Transfer to a Procedural area)   2 g 200 mL/hr over 30 Minutes Intravenous Every 24 hours 10/02/21 0033 10/09/21 2159   10/02/21 0000  ceFEPIme (MAXIPIME) 2 g in sodium chloride 0.9 % 100 mL IVPB        2 g 200 mL/hr over 30 Minutes Intravenous  Once 10/01/21 2358 10/02/21 0246   10/02/21 0000  metroNIDAZOLE (FLAGYL) IVPB 500 mg        500 mg 100 mL/hr over 60 Minutes Intravenous  Once 10/01/21 2358 10/02/21 0246   10/02/21 0000  vancomycin (VANCOCIN) IVPB 1000 mg/200 mL premix        1,000 mg 200 mL/hr over 60 Minutes Intravenous  Once 10/01/21 2358 10/02/21 0244       Assessment/Plan: 89F with right neck abscess To OR for I&D of abscess.   LOS: 2 days    Ralene Ok 10/04/2021

## 2021-10-04 NOTE — Anesthesia Postprocedure Evaluation (Signed)
Anesthesia Post Note  Patient: [employer]  Procedure(s) Performed: IRRIGATION AND DEBRIDEMENT NECK ABSCESS     Patient location during evaluation: PACU Anesthesia Type: General Level of consciousness: awake and alert Pain management: pain level controlled Vital Signs Assessment: post-procedure vital signs reviewed and stable Respiratory status: spontaneous breathing, nonlabored ventilation, respiratory function stable and patient connected to nasal cannula oxygen Cardiovascular status: blood pressure returned to baseline and stable Postop Assessment: no apparent nausea or vomiting Anesthetic complications: no   No notable events documented.  Last Vitals:  Vitals:   10/04/21 0940 10/04/21 1710  BP: (!) 122/91 103/71  Pulse: 97 97  Resp: 11 16  Temp: 36.7 C 37.1 C  SpO2: 96% 98%    Last Pain:  Vitals:   10/04/21 1710  TempSrc: Oral  PainSc:                  Santa Lighter

## 2021-10-04 NOTE — Anesthesia Preprocedure Evaluation (Addendum)
Anesthesia Evaluation  Patient identified by MRN, date of birth, ID band Patient awake  General Assessment Comment:POSTERIOR NECK ABSCESS  Reviewed: Allergy & Precautions, NPO status , Patient's Chart, lab work & pertinent test results, reviewed documented beta blocker date and time   Airway Mallampati: IV  TM Distance: >3 FB Neck ROM: Full    Dental  (+) Teeth Intact, Dental Advisory Given   Pulmonary asthma ,    Pulmonary exam normal breath sounds clear to auscultation       Cardiovascular hypertension, Pt. on home beta blockers and Pt. on medications  Rhythm:Regular Rate:Tachycardia     Neuro/Psych PSYCHIATRIC DISORDERS Depression  Neuromuscular disease    GI/Hepatic Neg liver ROS, GERD  ,  Endo/Other  diabetes, Type 1, Insulin Dependent  Renal/GU Renal InsufficiencyRenal disease     Musculoskeletal negative musculoskeletal ROS (+)   Abdominal   Peds  Hematology  (+) Blood dyscrasia, anemia ,   Anesthesia Other Findings Day of surgery medications reviewed with the patient.  Reproductive/Obstetrics                            Anesthesia Physical Anesthesia Plan  ASA: 3  Anesthesia Plan: General   Post-op Pain Management: Tylenol PO (pre-op)*   Induction: Intravenous  PONV Risk Score and Plan: 3 and Midazolam, Dexamethasone and Ondansetron  Airway Management Planned: Oral ETT and Video Laryngoscope Planned  Additional Equipment:   Intra-op Plan:   Post-operative Plan: Extubation in OR  Informed Consent: I have reviewed the patients History and Physical, chart, labs and discussed the procedure including the risks, benefits and alternatives for the proposed anesthesia with the patient or authorized representative who has indicated his/her understanding and acceptance.     Dental advisory given  Plan Discussed with: CRNA  Anesthesia Plan Comments:        Anesthesia  Quick Evaluation

## 2021-10-04 NOTE — Progress Notes (Signed)
Patient just arrived back to [place] room 16 from surgery alert and not expressing any signs of pain. Bed in lowest position call light in reach. Will continue to monitor pt.

## 2021-10-04 NOTE — Anesthesia Procedure Notes (Signed)
Procedure Name: Intubation Date/Time: 10/04/2021 7:55 AM Performed by: Amadeo Garnet, CRNA Pre-anesthesia Checklist: Patient identified, Emergency Drugs available, Suction available and Patient being monitored Patient Re-evaluated:Patient Re-evaluated prior to induction Oxygen Delivery Method: Circle system utilized Preoxygenation: Pre-oxygenation with 100% oxygen Induction Type: IV induction Ventilation: Mask ventilation without difficulty Laryngoscope Size: Mac and 4 Grade View: Grade I Tube type: Oral Tube size: 7.0 mm Number of attempts: 1 Airway Equipment and Method: Stylet and Oral airway Placement Confirmation: ETT inserted through vocal cords under direct vision, positive ETCO2 and breath sounds checked- equal and bilateral Secured at: 22 cm Tube secured with: Tape Dental Injury: Teeth and Oropharynx as per pre-operative assessment

## 2021-10-04 NOTE — Progress Notes (Signed)
Pharmacy Antibiotic Note  Lindsey Murillo is a 26 y.o. female admitted on 10/01/2021 with sepsis.  Pharmacy has been consulted for cefepime dosing. Patient with known CKD stage IV and recent admission in 06/2021 for acute on chronic renal failure and anemia of chronic disease. Scr 4.90, WBC 35.8.   S/p I&D this am, will follow up on cultures. Given CKD and rising Scr will adjust cefepime dose.  Plan: Adjust to Cefepime 1g IV q24 hours Linzolid 600 mg IV q12h per MD Follow-up culture data for de-escaltion    Height: 5' 7.01" (170.2 cm) Weight: 63.5 kg (139 lb 15.9 oz) IBW/kg (Calculated) : 61.62  Temp (24hrs), Avg:98.8 F (37.1 C), Min:98 F (36.7 C), Max:99.9 F (37.7 C)  Recent Labs  Lab 10/01/21 2053 10/01/21 2205 10/01/21 2336 10/03/21 0116 10/04/21 0051  WBC 38.9*  --   --  37.9* 35.8*  CREATININE 4.76*  --   --  4.80* 4.90*  LATICACIDVEN  --  0.7 0.7  --   --      Estimated Creatinine Clearance: 16.8 mL/min (A) (by C-G formula based on SCr of 4.9 mg/dL (H)).    No Known Allergies  Antimicrobials this admission: Vancomycin 5/26 x1 dose Cefepime 5/26>> Linezolid  5/27>>  Microbiology results: 5/25 Bcx: NGTD 5/28 Abscess cx: sent  Thank you for allowing pharmacy to be a part of this patient's care.  Lestine Box, PharmD PGY2 Infectious Diseases Pharmacy Resident   Please check [email or web address] for unit-specific pharmacy phone numbers

## 2021-10-05 ENCOUNTER — Encounter (HOSPITAL_COMMUNITY): Payer: Self-pay | Admitting: General Surgery

## 2021-10-05 DIAGNOSIS — E876 Hypokalemia: Secondary | ICD-10-CM | POA: Diagnosis not present

## 2021-10-05 DIAGNOSIS — N184 Chronic kidney disease, stage 4 (severe): Secondary | ICD-10-CM | POA: Diagnosis not present

## 2021-10-05 DIAGNOSIS — A419 Sepsis, unspecified organism: Secondary | ICD-10-CM | POA: Diagnosis not present

## 2021-10-05 DIAGNOSIS — N611 Abscess of the breast and nipple: Secondary | ICD-10-CM | POA: Diagnosis not present

## 2021-10-05 LAB — RENAL FUNCTION PANEL
Albumin: <1.5 g/dL — ABNORMAL LOW (ref 3.5–5.0)
Anion gap: 9 (ref 5–15)
BUN: 63 mg/dL — ABNORMAL HIGH (ref 6–20)
CO2: 11 mmol/L — ABNORMAL LOW (ref 22–32)
Calcium: 7.5 mg/dL — ABNORMAL LOW (ref 8.9–10.3)
Chloride: 107 mmol/L (ref 98–111)
Creatinine, Ser: 5.17 mg/dL — ABNORMAL HIGH (ref 0.44–1.00)
GFR, Estimated: 11 mL/min — ABNORMAL LOW (ref >60)
Glucose, Bld: 162 mg/dL — ABNORMAL HIGH (ref 70–99)
Phosphorus: 5.5 mg/dL — ABNORMAL HIGH (ref 2.5–4.6)
Potassium: 5 mmol/L (ref 3.5–5.1)
Sodium: 127 mmol/L — ABNORMAL LOW (ref 135–145)

## 2021-10-05 LAB — GLUCOSE, CAPILLARY
Glucose-Capillary: 163 mg/dL — ABNORMAL HIGH (ref 70–99)
Glucose-Capillary: 151 mg/dL — ABNORMAL HIGH (ref 70–99)
Glucose-Capillary: 182 mg/dL — ABNORMAL HIGH (ref 70–99)
Glucose-Capillary: 155 mg/dL — ABNORMAL HIGH (ref 70–99)
Glucose-Capillary: 108 mg/dL — ABNORMAL HIGH (ref 70–99)

## 2021-10-05 LAB — CBC
HCT: 24 % — ABNORMAL LOW (ref 36.0–46.0)
Hemoglobin: 7.6 g/dL — ABNORMAL LOW (ref 12.0–15.0)
MCH: 28.4 pg (ref 26.0–34.0)
MCHC: 31.7 g/dL (ref 30.0–36.0)
MCV: 89.6 fL (ref 80.0–100.0)
Platelets: 287 10*3/uL (ref 150–400)
RBC: 2.68 MIL/uL — ABNORMAL LOW (ref 3.87–5.11)
RDW: 16.7 % — ABNORMAL HIGH (ref 11.5–15.5)
WBC: 39.3 10*3/uL — ABNORMAL HIGH (ref 4.0–10.5)
nRBC: 0.1 % (ref 0.0–0.2)

## 2021-10-05 MED ORDER — SODIUM BICARBONATE 8.4 % IV SOLN
INTRAVENOUS | Status: DC
  Filled 2021-10-05 (×4): qty 1000

## 2021-10-05 MED ORDER — NEPRO/CARBSTEADY PO LIQD
237.0000 mL | Freq: Two times a day (BID) | ORAL | Status: DC
  Administered 2021-10-07 – 2021-10-20 (×21): 237 mL via ORAL

## 2021-10-05 NOTE — Evaluation (Signed)
Physical Therapy Evaluation Patient Details Name: Lindsey Murillo MRN: [id number] DOB: 26-Jan-1996 Today's Date: 10/05/2021  History of Present Illness  Lindsey Murillo is a 26 y.o. female  initially presented to [facility] ED on 5/25 with complaints of not feeling well, lower extremity edema, and posterior neck pain with associated drainage.  Mother at bedside assists with HPI given patient's resistance/evasive to questions.  Her mother reports that patient has not been feeling well for the 2 weeks prior and noted an area of the posterior left neck that started draining 2 days prior to admit. Transferred to [facility] on 5/26 for possible surgery.  Pt underwent I&D left neck abscess on 5/28. Pt with severe sepsis.  Also with right breast abscess that is being watched.  PMH: type 1 diabetes mellitus, stage IV CKD with nephrotic syndrome, anemia of chronic medical/renal disease, diabetic neuropathy, HTN  Clinical Impression  Pt admitted with above diagnosis. Pt was only able to transfer to chair from bed with mod to max assist of 2 persons. Pt could not stand fully upright therefore step pivot to chair from bed with bil UE support. Pt needed max encouragement to work with therapist and to get OOB. Pts mom supportive but did also recently have a right foot surgery.  REcommend Rehab stay and made referral.  Pt very weak and will need incr therapy.  Will need to participate and talked with pt regarding this.  Will follow acutely.  Pt currently with functional limitations due to the deficits listed below (see PT Problem List). Pt will benefit from skilled PT to increase their independence and safety with mobility to allow discharge to the venue listed below.          Recommendations for follow up therapy are one component of a multi-disciplinary discharge planning process, led by the attending physician.  Recommendations may be updated based on patient status, additional functional criteria and insurance  authorization.  Follow Up Recommendations Acute inpatient rehab (3hours/day)    Assistance Recommended at Discharge Frequent or constant Supervision/Assistance  Patient can return home with the following  Two people to help with walking and/or transfers;A lot of help with bathing/dressing/bathroom;Assistance with cooking/housework;Direct supervision/assist for medications management;Direct supervision/assist for financial management;Assist for transportation;Help with stairs or ramp for entrance    Equipment Recommendations Rolling walker (2 wheels)  Recommendations for Other Services  Rehab consult    Functional Status Assessment Patient has had a recent decline in their functional status and demonstrates the ability to make significant improvements in function in a reasonable and predictable amount of time.     Precautions / Restrictions Precautions Precautions: Fall Restrictions Weight Bearing Restrictions: No      Mobility  Bed Mobility Overal bed mobility: Needs Assistance Bed Mobility: Supine to Sit     Supine to sit: Mod assist     General bed mobility comments: Pt needed assist to move LEs off bed and for elevation of trunk as pt did not seem to want to move to eOB.    Transfers Overall transfer level: Needs assistance Equipment used: 2 person hand held assist Transfers: Sit to/from Stand, Bed to chair/wheelchair/BSC Sit to Stand: Mod assist, Max assist, +2 physical assistance, From elevated surface   Step pivot transfers: Mod assist, +2 physical assistance, From elevated surface       General transfer comment: Pt needed mod assist and use of momentum as well as bil UE support to stand to feet and pivot slowly to recliner from bed.  Did not stand fully upright but did stand enough to clear her bottom.    Ambulation/Gait                  Stairs            Wheelchair Mobility    Modified Rankin (Stroke Patients Only)       Balance  Overall balance assessment: Needs assistance Sitting-balance support: No upper extremity supported, Feet supported Sitting balance-Leahy Scale: Fair Sitting balance - Comments: Pt could sit EOB on her own but was flexed over with poor posture leaning her elbows onto knees.   Standing balance support: Bilateral upper extremity supported, During functional activity Standing balance-Leahy Scale: Poor Standing balance comment: Pt reliant on UE support for balance and could not stand fully upright.                             Pertinent Vitals/Pain Pain Assessment Pain Assessment: Faces Faces Pain Scale: Hurts worst Pain Location: neck Pain Descriptors / Indicators: Aching, Grimacing, Guarding, Crying Pain Intervention(s): Limited activity within patient's tolerance, Monitored during session, Repositioned    Home Living Family/patient expects to be discharged to:: Private residence Living Arrangements: Parent Available Help at Discharge: Family;Available 24 hours/day (pt ,mom and brother) Type of Home: House Home Access: Stairs to enter Entrance Stairs-Rails: None Entrance Stairs-Number of Steps: 2   Home Layout: One level Home Equipment: Cane - single point;BSC/3in1;Tub bench Additional Comments: Outpt therapy not long ago, stumbles per mom on stairs and leaned on walls and held onto furniture.    Prior Function Prior Level of Function : Needs assist             Mobility Comments: Mother reports that she assists pt at all times.  She used cane or furniture walked PTA.  Of note, mother just had a right foot surgery as well. ADLs Comments: PRN assist by mother     Hand Dominance   Dominant Hand: Left    Extremity/Trunk Assessment   Upper Extremity Assessment Upper Extremity Assessment: Defer to OT evaluation    Lower Extremity Assessment Lower Extremity Assessment: Generalized weakness    Cervical / Trunk Assessment Cervical / Trunk Assessment: Kyphotic   Communication   Communication: No difficulties  Cognition Arousal/Alertness: Awake/alert Behavior During Therapy: Flat affect Overall Cognitive Status: Within Functional Limits for tasks assessed                                 General Comments: Difficult to assess as pt not very talkative        General Comments      Exercises     Assessment/Plan    PT Assessment Patient needs continued PT services  PT Problem List Decreased activity tolerance;Decreased balance;Decreased mobility;Decreased knowledge of use of DME;Decreased safety awareness;Decreased knowledge of precautions;Pain       PT Treatment Interventions DME instruction;Gait training;Functional mobility training;Therapeutic activities;Therapeutic exercise;Balance training;Patient/family education;Stair training    PT Goals (Current goals can be found in the Care Plan section)  Acute Rehab PT Goals Patient Stated Goal: to go home PT Goal Formulation: With patient Time For Goal Achievement: 10/19/21 Potential to Achieve Goals: Fair    Frequency Min 3X/week     Co-evaluation               AM-PAC PT "6 Clicks" Mobility  Outcome Measure Help needed turning  from your back to your side while in a flat bed without using bedrails?: A Little Help needed moving from lying on your back to sitting on the side of a flat bed without using bedrails?: A Lot Help needed moving to and from a bed to a chair (including a wheelchair)?: Total Help needed standing up from a chair using your arms (e.g., wheelchair or bedside chair)?: Total Help needed to walk in hospital room?: Total Help needed climbing 3-5 steps with a railing? : Total 6 Click Score: 9    End of Session Equipment Utilized During Treatment: Gait belt Activity Tolerance: Patient limited by fatigue (Pt self limiting, had to encourage OOB) Patient left: in chair;with call bell/phone within reach;with chair alarm set;with family/visitor  present Nurse Communication: Mobility status PT Visit Diagnosis: Unsteadiness on feet (R26.81);Muscle weakness (generalized) (M62.81);Pain Pain - part of body:  (neck)    Time: 0263-7858 PT Time Calculation (min) (ACUTE ONLY): 23 min   Charges:   PT Evaluation $PT Eval Moderate Complexity: 1 Mod PT Treatments $Therapeutic Activity: 8-22 mins        Quatisha Zylka M,PT Acute Rehab Services [phone] (pager)   Alvira Philips 10/05/2021, 1:36 PM

## 2021-10-05 NOTE — Progress Notes (Signed)
PROGRESS NOTE    Lindsey Murillo  IRC:[id number] DOB: 1996/05/01 DOA: 10/01/2021 PCP: Coral Spikes, DO    Brief Narrative:   Lindsey Murillo is a 26 y.o. female with past medical history significant for is a 26 year old female with past medical history significant for type 1 diabetes mellitus, stage IV CKD with nephrotic syndrome, anemia of chronic medical/renal disease, diabetic neuropathy, HTN who initially presented to [facility] ED on 5/25 with complaints of not feeling well, lower extremity edema, and posterior neck pain with associated drainage.  Mother at bedside assists with HPI given patient's resistance/evasive to questions.  Her mother reports that patient has not been feeling well for the last 2 weeks and noted a area of the posterior left neck that started draining 2 days prior.  Patient/mother denied any trauma to that region.  Pain is worse with any type of range of motion of the neck, nothing seems to make it better.  Mother put some ointment on it which offered her no relief.  In the ED, temperature 98.2 F, HR 102, RR 17, BP 96/67, SPO2 100% on room air.  Sodium 130, potassium 2.7, chloride 107, CO2 14, glucose 179, BUN 66, creatinine 4.76.  BNP 626.0.  WBC 38.9, hemoglobin 8.6, platelet count 330.  Lactic acid 0.7, procalcitonin 1.85.  INR 1.2.  Urinalysis with trace leukocytes, negative nitrite, rare bacteria, 11-20 WBCs.  Pregnancy test negative.  Chest x-ray with cardiomegaly, no active cardiopulmonary disease process.  CT soft tissue neck with area of phlegmon/abscess measuring 4 x 2.4 x 4.8 cm localized upper left neck with enlarged left upper cervical chain lymph nodes.  Blood cultures x2 were drawn.  Patient was started on vancomycin, cefepime, Flagyl.  Initially admitted to [facility].  Valuated by general surgery, Dr. Arnoldo Morale and requested transfer to a higher level of care due to multiple comorbidities.  Case was discussed with ENT, Dr. Benjamine Mola; and location was  out of scope of ENT and recommended transfer to [facility] for general surgery evaluation.  Patient was transferred to [facility] and admitted to the hospital service with general surgery in consultation.  Assessment & Plan:   Posterior neck abscess Severe sepsis sepsis, POA Patient presenting to ED with 2-week history of progressive fatigue and not feeling well with associated 2-day history of left posterior neck swelling with now purulent drainage.  Patient was afebrile but with leukocytosis, tachycardia, elevated procalcitonin and end organ dysfunction with acute on chronic renal failure.  Received IV albumin x3 doses CT soft tissue neck with area of phlegmon/abscess measuring 4 x 2.4 x 4.8 cm localized upper left neck with enlarged left upper cervical chain lymph nodes.  Patient underwent I&D by general surgery, Dr. Rosendo Gros on 10/04/2021. --General surgery following, appreciate assistance --WBC 38.9>37.9>35.8>39.3 --Blood cultures x2 5/25: Growth x 4 days --Operative culture 5/28: Moderate GPC's in pairs/clusters, further pending --Linezolid 600 mg IV every 12 hours --Cefepime 2 g IV every 24 hours --Oxycodone 5 mg PO q4h PRN moderate pain --dilaudid 0.5mg  IV q3h PRN severe pain --CBC daily  Right breast abscess --Ultrasound soft tissue: Completed, pending radiology review --Antibiotics as above --Seen by general surgery with recommendations for outpatient follow-up with PCP and get evaluation  Type 1 diabetes mellitus with peripheral neuropathy Episode of hypoglycemia Hemoglobin A1c 9.8, poorly controlled.  Follows with endocrinology outpatient.  Reportedly on Semglee 12 units obviously daily, but likely inconsistent use --Patient with episodes of hypoglycemia due to poor oral intake --Will hold  Semglee for now --SSI for coverage --CBGs before every meal/at bedtime  Acute renal failure on CKD stage IV with nephrotic syndrome Creatinine 4.76 on admission with baseline 3.0.  Etiology  likely secondary to severe sepsis in the setting of poor oral intake/dehydration versus ATN with mild hypotension on admission.  Received IV albumin x3. --Cr 4.76>4.80>4.90>5.17 --Sodium bicarb drip at 75 mL/h hour --Avoid nephrotoxins, renal dose all medications --Renal panel daily  Essential hypertension Hold home metoprolol and torsemide for borderline hypotension  Hyperlipidemia: Crestor 10 mg p.o. daily  Anemia of chronic medical/renal disease Follows with hematology outpatient at [facility], Dr. Jamey Reas.  Iron 22, ferritin 1192, folate 11.6, B12 2417. --Hgb 8.6>7.5>7.1>7.6 --CBC daily, transfuse for hemoglobin less than 7.0  Severe protein calorie malnutrition Body mass index is 24.37 kg/m.  --Dietitian consult  Weakness/deconditioning/gait disturbance: --PT/OT evaluation  DVT prophylaxis: heparin injection 5,000 Units Start: 10/02/21 1000 SCDs Start: 10/02/21 9702    Code Status: Full Code Family Communication: Updated mother present at bedside this morning  Disposition Plan:  Level of care: Telemetry Medical Status is: Inpatient Remains inpatient appropriate because: Continue antibiotics IV, pending operative culture results    Consultants:  General surgery  Procedures:  I&D posterior neck abscess, Dr. Rosendo Gros 10/04/2021  Antimicrobials:  Linezolid 5/27>> Cefepime 5/25>> Vancomycin 5/25 - 5/25 Metronidazole 5/25 - 5/25   Subjective: Patient seen examined bedside, lying in bed.  Ill in appearance.  Continues to be poorly interactive and nods/shakes head to answer.  Mother assists with questions at bedside.  Underwent I&D of neck abscess yesterday.  Seen by general surgery this morning with recommendations of outpatient follow-up in regard to her right breast abscess that is chronic.  Remains on IV antibiotics awaiting operative culture results.  No other specific complaints or concerns at this time.  Denies chest pain, no shortness of breath, no abdominal  pain.  No other acute concerns overnight per nursing staff.  Objective: Vitals:   10/05/21 0358 10/05/21 0406 10/05/21 0623 10/05/21 0839  BP:  96/66  92/63  Pulse:  95  90  Resp:  14 18 17   Temp:  98.6 F (37 C)  98.7 F (37.1 C)  TempSrc:  Oral  Oral  SpO2:  100%  97%  Weight: 70.6 kg     Height:        Intake/Output Summary (Last 24 hours) at 10/05/2021 1057 Last data filed at 10/05/2021 0645 Gross per 24 hour  Intake 240 ml  Output 350 ml  Net -110 ml   Filed Weights   10/02/21 0531 10/04/21 0703 10/05/21 0358  Weight: 63.5 kg 63.5 kg 70.6 kg    Examination:  Physical Exam: GEN: NAD, alert and oriented x 3, chronically ill in appearance, appears older than stated age, pale HEENT: NCAT, PERRL, EOMI, sclera clear, dry mucous membranes, posterior left lateral neck with fluctuance, tender to palpation with purulent drainage PULM: CTAB w/o wheezes/crackles, normal respiratory effort, on room air Breast: Right breast with fluctuance and inflammation medial-inferior quadrant, NTTP CV: RRR w/o M/G/R GI: abd soft, NTND, NABS, no R/G/M MSK: + peripheral edema, moves all extremities independently NEURO: CN II-XII intact, no focal deficits, sensation to light touch intact PSYCH: Depressed mood, flat affect Integumentary: posterior left lateral neck with dressing in place, tender to palpation   Data Reviewed: I have personally reviewed following labs and imaging studies  CBC: Recent Labs  Lab 10/01/21 2053 10/03/21 0116 10/04/21 0051 10/05/21 0125  WBC 38.9* 37.9* 35.8* 39.3*  NEUTROABS  --  35.6*  --   --   HGB 8.6* 7.5* 7.1* 7.6*  HCT 26.2* 22.8* 21.9* 24.0*  MCV 85.6 85.4 87.6 89.6  PLT 330 315 285 528   Basic Metabolic Panel: Recent Labs  Lab 10/01/21 2053 10/03/21 0116 10/04/21 0051 10/05/21 0125  NA 130* 127* 127* 127*  K 2.7* 3.3* 5.0 5.0  CL 107 104 107 107  CO2 14* 13* 12* 11*  GLUCOSE 179* 68* 116* 162*  BUN 66* 64* 63* 63*  CREATININE 4.76*  4.80* 4.90* 5.17*  CALCIUM 7.7* 7.6* 7.4* 7.5*  MG  --  1.7  --   --   PHOS  --   --  5.2* 5.5*   GFR: Estimated Creatinine Clearance: 15.9 mL/min (A) (by C-G formula based on SCr of 5.17 mg/dL (H)). Liver Function Tests: Recent Labs  Lab 10/01/21 2336 10/03/21 0116 10/04/21 0051 10/05/21 0125  AST 13* 11*  --   --   ALT 19 14  --   --   ALKPHOS 310* 242*  --   --   BILITOT 0.4 0.6  --   --   PROT 5.4* 5.4*  --   --   ALBUMIN 1.5* 1.7* <1.5* <1.5*   No results for input(s): LIPASE, AMYLASE in the last 168 hours. No results for input(s): AMMONIA in the last 168 hours. Coagulation Profile: Recent Labs  Lab 10/01/21 2205 10/03/21 0116  INR 1.2 1.4*   Cardiac Enzymes: No results for input(s): CKTOTAL, CKMB, CKMBINDEX, TROPONINI in the last 168 hours. BNP (last 3 results) No results for input(s): PROBNP in the last 8760 hours. HbA1C: No results for input(s): HGBA1C in the last 72 hours. CBG: Recent Labs  Lab 10/04/21 1201 10/04/21 1711 10/04/21 2128 10/05/21 0348 10/05/21 0841  GLUCAP 115* 119* 198* 163* 151*   Lipid Profile: No results for input(s): CHOL, HDL, LDLCALC, TRIG, CHOLHDL, LDLDIRECT in the last 72 hours. Thyroid Function Tests: Recent Labs    10/03/21 0116  TSH 0.975   Anemia Panel: Recent Labs    10/04/21 0051  VITAMINB12 2,417*  FOLATE 11.6  FERRITIN 1,192*  TIBC NOT CALCULATED  IRON 22*  RETICCTPCT 3.3*   Sepsis Labs: Recent Labs  Lab 10/01/21 2205 10/01/21 2336 10/03/21 0116  PROCALCITON  --  1.85 1.86  LATICACIDVEN 0.7 0.7  --     Recent Results (from the past 240 hour(s))  Culture, blood (Routine x 2)     Status: None (Preliminary result)   Collection Time: 10/01/21 10:05 PM   Specimen: BLOOD  Result Value Ref Range Status   Specimen Description BLOOD RIGHT ANTECUBITAL  Final   Special Requests   Final    BOTTLES DRAWN AEROBIC AND ANAEROBIC Blood Culture adequate volume   Culture   Final    NO GROWTH 4 DAYS Performed at  [id number], [address], [place], [place] [address]    Report Status PENDING  Incomplete  Culture, blood (Routine x 2)     Status: None (Preliminary result)   Collection Time: 10/01/21 10:05 PM   Specimen: BLOOD  Result Value Ref Range Status   Specimen Description BLOOD BLOOD LEFT HAND  Final   Special Requests   Final    BOTTLES DRAWN AEROBIC AND ANAEROBIC Blood Culture adequate volume   Culture   Final    NO GROWTH 4 DAYS Performed at [facility], [address] [place], [place] [address]    Report Status PENDING  Incomplete  Surgical pcr screen  Status: Abnormal   Collection Time: 10/03/21  1:00 PM   Specimen: Nasal Mucosa; Nasal Swab  Result Value Ref Range Status   MRSA, PCR NEGATIVE NEGATIVE Final   Staphylococcus aureus POSITIVE (A) NEGATIVE Final    Comment: (NOTE) The Xpert SA Assay (FDA approved for NASAL specimens in patients 57 years of age and older), is one component of a comprehensive surveillance program. It is not intended to diagnose infection nor to guide or monitor treatment. Performed at [facility], [place] [address], [place], [place] [address]   Aerobic/Anaerobic Culture w Gram Stain (surgical/deep wound)     Status: None (Preliminary result)   Collection Time: 10/04/21  8:44 AM   Specimen: Abscess  Result Value Ref Range Status   Specimen Description ABSCESS NECK  Final   Special Requests NONE  Final   Gram Stain   Final    FEW WBC PRESENT, PREDOMINANTLY PMN MODERATE GRAM POSITIVE COCCI IN PAIRS IN CLUSTERS Performed at [facility], [address] [place], [place] [address]    Culture MODERATE STAPHYLOCOCCUS AUREUS  Final   Report Status PENDING  Incomplete         Radiology Studies: No results found.      Scheduled Meds:  calcium acetate  667 mg Oral TID WC   feeding supplement  237 mL Oral BID BM   heparin  5,000 Units Subcutaneous Q8H   insulin aspart  0-5 Units Subcutaneous QHS   insulin aspart  0-9 Units  Subcutaneous TID WC   mupirocin ointment  1 application. Nasal BID   rosuvastatin  10 mg Oral Daily   Continuous Infusions:  ceFEPime (MAXIPIME) IV 1 g (10/04/21 2138)   linezolid (ZYVOX) IV 600 mg (10/05/21 0951)   sodium bicarbonate 150 mEq in D5W infusion 75 mL/hr at 10/05/21 0808     LOS: 3 days    Time spent: 46 minutes spent on chart review, discussion with nursing staff, consultants, updating family and interview/physical exam; more than 50% of that time was spent in counseling and/or coordination of care.    Thetis Schwimmer J [place] DO [facility] Available via Epic secure chat 7am-7pm After these hours, please refer to coverage provider listed on [email or web address] 10/05/2021, 10:57 AM

## 2021-10-05 NOTE — Progress Notes (Signed)
Initial Nutrition Assessment  DOCUMENTATION CODES:   Not applicable  INTERVENTION:   Nepro BID; pt would prefer butter pecan   Encourage PO intake   Reviewed menu options and ordering process. Encouraged protein foods and reviewed those options.   NUTRITION DIAGNOSIS:   Increased nutrient needs related to  (sepsis) as evidenced by estimated needs.  GOAL:   Patient will meet greater than or equal to 90% of their needs  MONITOR:   PO intake, Supplement acceptance  REASON FOR ASSESSMENT:   Consult Assessment of nutrition requirement/status  ASSESSMENT:   Pt with PMH off type 1 DM, stage IV CKD with nephrotic syndrome, anemia of chronic disease, diabetic neuropathy, and HTN admitted with posterior neck abscess and severe sepsis.    Spoke with pt who was not engaged in conversation but slowly began to answer more questions. Mom in room and answered most questions for pt.  Pt currently not receiving any long acting insulin. Pt with reported uncontrolled type 1 DM.  Currently poor appetite eating 10-20% of her meals.  We reviewed her menu for options she would like. She likes pasta. She is lactose intolerant and willing to try a supplement. She would like butter pecan nepro.  Per chart review pt has hx of pancreatitis and was previously on creon. She reports she stopped this because it does not work.  She feels that she has not lost weight. Per chart review weight has varied from 139 lb to 155 lb. She reports that she does sometimes have edema present.   Per PT notes pt was moderate to max assist of 2 persons to get to chair from bed and could not stand fully upright. Per interview pt states she is able to get to the bathroom herself and mom states she does not help her in the bathroom. NFPE does show moderate depletion of quads.   Meal Completion: 10-20%  Medications reviewed and include: phoslo TID with meals, SSI with meals and bedtime  Nabicarb @ 75 ml/hr  Labs reviewed:  Na 127, PO4: 5.5, Albumin <1.5, Corrected calcium: 9.5, Iron: 22, Vitamin B12: 2417 A1C: 9.8 (5/18) CBG's: 115-198    NUTRITION - FOCUSED PHYSICAL EXAM:  Flowsheet Row Most Recent Value  Orbital Region No depletion  Upper Arm Region No depletion  Thoracic and Lumbar Region No depletion  Buccal Region No depletion  Temple Region No depletion  Clavicle Bone Region No depletion  Clavicle and Acromion Bone Region No depletion  Scapular Bone Region Unable to assess  Dorsal Hand No depletion  Patellar Region Moderate depletion  Anterior Thigh Region Moderate depletion  Posterior Calf Region Unable to assess  [edema]  Edema (RD Assessment) Moderate  Hair Reviewed  Eyes Reviewed  Mouth Reviewed  Skin Reviewed  [bleeding neck abscess, red scabbed R breast abscess, multiple areas of scabbed/discolored from previous sores]  Nails Reviewed       Diet Order:   Diet Order             Diet Carb Modified Fluid consistency: Thin; Room service appropriate? Yes  Diet effective now                   EDUCATION NEEDS:   Education needs have been addressed  Skin:  Skin Assessment: Reviewed RN Assessment (L neck abscess, R breast scab)  Last BM:  5/26  Height:   Ht Readings from Last 1 Encounters:  10/04/21 5' 7.01" (1.702 m)    Weight:   Wt Readings from  Last 1 Encounters:  10/05/21 70.6 kg    BMI:  Body mass index is 24.37 kg/m.  Estimated Nutritional Needs:   Kcal:  1900-2100  Protein:  100-115 grams  Fluid:  >1.9 L/day  Lockie Pares., RD, LDN, CNSC See AMiON for contact information

## 2021-10-05 NOTE — Progress Notes (Signed)
Inpatient Rehab Admissions Coordinator:  ° °Per therapy recommendations patient was screened for CIR candidacy by Puneet Masoner, MS, CCC-SLP. At this time, Pt. Appears to be a a potential candidate for CIR. I will place   order for rehab consult per protocol for full assessment. Please contact me any with questions. ° °Sourish Allender, MS, CCC-SLP °Rehab Admissions Coordinator  °[phone] (celll) °[phone] (office) ° °

## 2021-10-05 NOTE — Progress Notes (Signed)
Patient unable to urinate, bladder scan showing 439, MD made aware.

## 2021-10-05 NOTE — Progress Notes (Signed)
1 Day Post-Op   Subjective/Chief Complaint: Still with neck pain postop, not talkative   Objective: Vital signs in last 24 hours: Temp:  [98 F (36.7 C)-98.7 F (37.1 C)] 98.6 F (37 C) (05/29 0406) Pulse Rate:  [92-97] 95 (05/29 0406) Resp:  [11-18] 18 (05/29 0623) BP: (96-122)/(66-91) 96/66 (05/29 0406) SpO2:  [92 %-100 %] 100 % (05/29 0406) Weight:  [70.6 kg] 70.6 kg (05/29 0358) Last BM Date : 10/02/21 (per pt report but not observed)  Intake/Output from previous day: 05/28 0701 - 05/29 0700 In: 740 [P.O.:240; I.V.:500] Out: 350 [Urine:350] Intake/Output this shift: No intake/output data recorded.  Left neck wound packed, still tender, no real erythema  Lab Results:  Recent Labs    10/04/21 0051 10/05/21 0125  WBC 35.8* 39.3*  HGB 7.1* 7.6*  HCT 21.9* 24.0*  PLT 285 287   BMET Recent Labs    10/04/21 0051 10/05/21 0125  NA 127* 127*  K 5.0 5.0  CL 107 107  CO2 12* 11*  GLUCOSE 116* 162*  BUN 63* 63*  CREATININE 4.90* 5.17*  CALCIUM 7.4* 7.5*   PT/INR Recent Labs    10/03/21 0116  LABPROT 16.8*  INR 1.4*   ABG No results for input(s): PHART, HCO3 in the last 72 hours.  Invalid input(s): PCO2, PO2  Studies/Results: No results found.  Anti-infectives: Anti-infectives (From admission, onward)    Start     Dose/Rate Route Frequency Ordered Stop   10/04/21 2200  ceFEPIme (MAXIPIME) 1 g in sodium chloride 0.9 % 100 mL IVPB        1 g 200 mL/hr over 30 Minutes Intravenous Every 24 hours 10/04/21 1011 10/09/21 2159   10/03/21 2200  vancomycin (VANCOREADY) IVPB 750 mg/150 mL  Status:  Discontinued        750 mg 150 mL/hr over 60 Minutes Intravenous Every 48 hours 10/02/21 0033 10/02/21 1144   10/03/21 1000  linezolid (ZYVOX) IVPB 600 mg        600 mg 300 mL/hr over 60 Minutes Intravenous Every 12 hours 10/02/21 1144     10/02/21 2200  ceFEPIme (MAXIPIME) 2 g in sodium chloride 0.9 % 100 mL IVPB  Status:  Discontinued        2 g 200 mL/hr  over 30 Minutes Intravenous Every 24 hours 10/02/21 0033 10/04/21 1011   10/02/21 0000  ceFEPIme (MAXIPIME) 2 g in sodium chloride 0.9 % 100 mL IVPB        2 g 200 mL/hr over 30 Minutes Intravenous  Once 10/01/21 2358 10/02/21 0246   10/02/21 0000  metroNIDAZOLE (FLAGYL) IVPB 500 mg        500 mg 100 mL/hr over 60 Minutes Intravenous  Once 10/01/21 2358 10/02/21 0246   10/02/21 0000  vancomycin (VANCOCIN) IVPB 1000 mg/200 mL premix        1,000 mg 200 mL/hr over 60 Minutes Intravenous  Once 10/01/21 2358 10/02/21 0244       Assessment/Plan: Left neck abscess POD 1 I/D -will start dressing changes later today -continue abx Right breast chronic abscess -needs to see pcp and get evaluation upon dc  Rolm Bookbinder 10/05/2021

## 2021-10-05 NOTE — Progress Notes (Signed)
Mobility Specialist Progress Note:   10/05/21 1330  Mobility  Activity Transferred from chair to bed  Level of Assistance Maximum assist, patient does 25-49% (+2)  Assistive Device Stedy  Activity Response Tolerated well  $Mobility charge 1 Mobility   Pt requesting to go back to bed d/t chair hurting bottom. Required modA+2 to stand with stedy, + max verbal cues. Pt tolerated transfer well. Left in bed with all needs met.   Nelta Numbers Acute Rehab Secure Chat or Office Phone: [phone]

## 2021-10-06 ENCOUNTER — Ambulatory Visit: Payer: 59 | Admitting: "Endocrinology

## 2021-10-06 DIAGNOSIS — A419 Sepsis, unspecified organism: Secondary | ICD-10-CM | POA: Diagnosis not present

## 2021-10-06 DIAGNOSIS — N611 Abscess of the breast and nipple: Secondary | ICD-10-CM | POA: Diagnosis not present

## 2021-10-06 DIAGNOSIS — N184 Chronic kidney disease, stage 4 (severe): Secondary | ICD-10-CM | POA: Diagnosis not present

## 2021-10-06 DIAGNOSIS — E876 Hypokalemia: Secondary | ICD-10-CM | POA: Diagnosis not present

## 2021-10-06 LAB — RENAL FUNCTION PANEL
Albumin: <1.5 g/dL — ABNORMAL LOW (ref 3.5–5.0)
Anion gap: 11 (ref 5–15)
BUN: 65 mg/dL — ABNORMAL HIGH (ref 6–20)
CO2: 15 mmol/L — ABNORMAL LOW (ref 22–32)
Calcium: 7.8 mg/dL — ABNORMAL LOW (ref 8.9–10.3)
Chloride: 100 mmol/L (ref 98–111)
Creatinine, Ser: 5.45 mg/dL — ABNORMAL HIGH (ref 0.44–1.00)
GFR, Estimated: 10 mL/min — ABNORMAL LOW (ref >60)
Glucose, Bld: 122 mg/dL — ABNORMAL HIGH (ref 70–99)
Phosphorus: 5.1 mg/dL — ABNORMAL HIGH (ref 2.5–4.6)
Potassium: 4.8 mmol/L (ref 3.5–5.1)
Sodium: 126 mmol/L — ABNORMAL LOW (ref 135–145)

## 2021-10-06 LAB — GLUCOSE, CAPILLARY
Glucose-Capillary: 126 mg/dL — ABNORMAL HIGH (ref 70–99)
Glucose-Capillary: 137 mg/dL — ABNORMAL HIGH (ref 70–99)
Glucose-Capillary: 132 mg/dL — ABNORMAL HIGH (ref 70–99)
Glucose-Capillary: 101 mg/dL — ABNORMAL HIGH (ref 70–99)
Glucose-Capillary: 119 mg/dL — ABNORMAL HIGH (ref 70–99)

## 2021-10-06 LAB — CBC
HCT: 22.3 % — ABNORMAL LOW (ref 36.0–46.0)
Hemoglobin: 7.2 g/dL — ABNORMAL LOW (ref 12.0–15.0)
MCH: 27.8 pg (ref 26.0–34.0)
MCHC: 32.3 g/dL (ref 30.0–36.0)
MCV: 86.1 fL (ref 80.0–100.0)
Platelets: 304 10*3/uL (ref 150–400)
RBC: 2.59 MIL/uL — ABNORMAL LOW (ref 3.87–5.11)
RDW: 16.6 % — ABNORMAL HIGH (ref 11.5–15.5)
WBC: 35.9 10*3/uL — ABNORMAL HIGH (ref 4.0–10.5)
nRBC: 0.1 % (ref 0.0–0.2)

## 2021-10-06 LAB — CULTURE, BLOOD (ROUTINE X 2)
Culture: NO GROWTH
Culture: NO GROWTH
Special Requests: ADEQUATE
Special Requests: ADEQUATE

## 2021-10-06 LAB — TROPONIN I (HIGH SENSITIVITY)
Troponin I (High Sensitivity): 23 ng/L — ABNORMAL HIGH (ref <18)
Troponin I (High Sensitivity): 46 ng/L — ABNORMAL HIGH (ref <18)

## 2021-10-06 MED ORDER — OXYCODONE HCL 5 MG PO TABS
5.0000 mg | ORAL_TABLET | ORAL | Status: DC | PRN
  Administered 2021-10-08 – 2021-10-11 (×2): 10 mg via ORAL
  Filled 2021-10-06 (×3): qty 2

## 2021-10-06 MED ORDER — CEFAZOLIN SODIUM-DEXTROSE 1-4 GM/50ML-% IV SOLN
1.0000 g | Freq: Two times a day (BID) | INTRAVENOUS | Status: DC
  Administered 2021-10-06 – 2021-10-18 (×24): 1 g via INTRAVENOUS
  Filled 2021-10-06 (×28): qty 50

## 2021-10-06 MED ORDER — ACETAMINOPHEN 325 MG PO TABS
650.0000 mg | ORAL_TABLET | Freq: Four times a day (QID) | ORAL | Status: DC
  Administered 2021-10-06 – 2021-10-22 (×47): 650 mg via ORAL
  Filled 2021-10-06 (×55): qty 2

## 2021-10-06 NOTE — Progress Notes (Signed)
PROGRESS NOTE    Lindsey Murillo  PYK:[id number] DOB: 05/08/1996 DOA: 10/01/2021 PCP: Coral Spikes, DO    Brief Narrative:   Lindsey Murillo is a 26 y.o. female with past medical history significant for is a 25 year old female with past medical history significant for type 1 diabetes mellitus, stage IV CKD with nephrotic syndrome, anemia of chronic medical/renal disease, diabetic neuropathy, HTN who initially presented to [facility] ED on 5/25 with complaints of not feeling well, lower extremity edema, and posterior neck pain with associated drainage.  Mother at bedside assists with HPI given patient's resistance/evasive to questions.  Her mother reports that patient has not been feeling well for the last 2 weeks and noted a area of the posterior left neck that started draining 2 days prior.  Patient/mother denied any trauma to that region.  Pain is worse with any type of range of motion of the neck, nothing seems to make it better.  Mother put some ointment on it which offered her no relief.  In the ED, temperature 98.2 F, HR 102, RR 17, BP 96/67, SPO2 100% on room air.  Sodium 130, potassium 2.7, chloride 107, CO2 14, glucose 179, BUN 66, creatinine 4.76.  BNP 626.0.  WBC 38.9, hemoglobin 8.6, platelet count 330.  Lactic acid 0.7, procalcitonin 1.85.  INR 1.2.  Urinalysis with trace leukocytes, negative nitrite, rare bacteria, 11-20 WBCs.  Pregnancy test negative.  Chest x-ray with cardiomegaly, no active cardiopulmonary disease process.  CT soft tissue neck with area of phlegmon/abscess measuring 4 x 2.4 x 4.8 cm localized upper left neck with enlarged left upper cervical chain lymph nodes.  Blood cultures x2 were drawn.  Patient was started on vancomycin, cefepime, Flagyl.  Initially admitted to [facility].  Valuated by general surgery, Dr. Arnoldo Morale and requested transfer to a higher level of care due to multiple comorbidities.  Case was discussed with ENT, Dr. Benjamine Mola; and location was  out of scope of ENT and recommended transfer to [facility] for general surgery evaluation.  Patient was transferred to [facility] and admitted to the hospital service with general surgery in consultation.  Assessment & Plan:   Posterior neck abscess Severe sepsis sepsis, POA Patient presenting to ED with 2-week history of progressive fatigue and not feeling well with associated 2-day history of left posterior neck swelling with now purulent drainage.  Patient was afebrile but with leukocytosis, tachycardia, elevated procalcitonin and end organ dysfunction with acute on chronic renal failure.  Received IV albumin x3 doses CT soft tissue neck with area of phlegmon/abscess measuring 4 x 2.4 x 4.8 cm localized upper left neck with enlarged left upper cervical chain lymph nodes.  Patient underwent I&D by general surgery, Dr. Rosendo Gros on 10/04/2021. --General surgery following, appreciate assistance --WBC 38.9>37.9>35.8>39.3>35.9 --Blood cultures x2 5/25: No growth x 5 days --Operative culture 5/28: + MSSA --Change linezolid to cefazolin today --Oxycodone 5 mg PO q4h PRN moderate pain --dilaudid 0.5mg  IV q3h PRN severe pain --CBC daily  Right breast abscess Ultrasound right breast with diffuse skin thickening and edema of the underlying soft tissue in the medial right breast with no definite or focal fluid collection. --Antibiotics as above --Seen by general surgery with recommendations for outpatient follow-up  Type 1 diabetes mellitus with peripheral neuropathy Episode of hypoglycemia Hemoglobin A1c 9.8, poorly controlled.  Follows with endocrinology outpatient.  Reportedly on Semglee 12 units Hatch daily, but likely inconsistent use --Patient with episodes of hypoglycemia due to poor oral intake --  Will hold Semglee for now --SSI for coverage --CBGs before every meal/at bedtime  Acute renal failure on CKD stage IV with nephrotic syndrome Creatinine 4.76 on admission with baseline 3.0.  Etiology  likely secondary to severe sepsis in the setting of poor oral intake/dehydration versus ATN with mild hypotension on admission.  Also with episodes of acute urinary retention receiving in and out catheterization.  Currently declining Foley catheter placement.  Received IV albumin x3. --Cr 4.76>4.80>4.90>5.17>5.45 --Sodium bicarb drip at 75 mL/h hour --Avoid nephrotoxins, renal dose all medications --Holding home diuretic --Renal panel daily; if creatinine continues to trend up may need nephrology evaluation  Essential hypertension Hold home metoprolol and torsemide for borderline hypotension  Hyperlipidemia: Crestor 10 mg p.o. daily  Anemia of chronic medical/renal disease Follows with hematology outpatient at [facility], Dr. Jamey Reas.  Iron 22, ferritin 1192, folate 11.6, B12 2417. --Hgb 8.6>7.5>7.1>7.6>7.2 --CBC daily, transfuse for hemoglobin less than 7.0  Severe protein calorie malnutrition Body mass index is 24.37 kg/m.  --Dietitian consult  Weakness/deconditioning/gait disturbance: --PT/OT CIR when medically stable for discharge, continue therapy efforts while inpatient  DVT prophylaxis: heparin injection 5,000 Units Start: 10/02/21 1000 SCDs Start: 10/02/21 0109    Code Status: Full Code Family Communication: Updated mother present at bedside this morning  Disposition Plan:  Level of care: Telemetry Medical Status is: Inpatient Remains inpatient appropriate because: Continue antibiotics IV, will need CIR on discharge.    Consultants:  General surgery  Procedures:  I&D posterior neck abscess, Dr. Rosendo Gros 10/04/2021  Antimicrobials:  Linezolid 5/27>> Cefepime 5/25>> Vancomycin 5/25 - 5/25 Metronidazole 5/25 - 5/25   Subjective: Patient seen examined bedside, lying in bed.  Ill in appearance.  Mother present.  Remains poorly interactive.  Discussed with patient and mother regarding several episodes of urinary retention in which she has received in and out  catheterization last night.  Discussed Foley catheter placement given rising creatinine but patient would like not to proceed with this at this time.  Operative culture with MSSA, change linezolid to cefazolin.  Patient complaining of some dizziness and chest discomfort this morning.  Ordering EKG and troponin.  No other specific complaints or concerns at this time.  Denies headache, no nausea/vomiting/diarrhea, no fever/chills/night sweats, no abdominal pain, no shortness of breath.   No other acute concerns overnight per nursing staff.  Objective: Vitals:   10/05/21 1922 10/06/21 0316 10/06/21 0801 10/06/21 1000  BP: 104/64 101/70 (!) 91/45 105/79  Pulse: 89 87 84   Resp:  17 16   Temp: (!) 97.5 F (36.4 C) 97.7 F (36.5 C) 97.6 F (36.4 C)   TempSrc: Oral Oral Axillary   SpO2: 96% 100% 100%   Weight:      Height:        Intake/Output Summary (Last 24 hours) at 10/06/2021 1249 Last data filed at 10/05/2021 1900 Gross per 24 hour  Intake 1001.59 ml  Output 350 ml  Net 651.59 ml   Filed Weights   10/02/21 0531 10/04/21 0703 10/05/21 0358  Weight: 63.5 kg 63.5 kg 70.6 kg    Examination:  Physical Exam: GEN: NAD, alert and oriented x 3, chronically ill in appearance, appears older than stated age, pale HEENT: NCAT, PERRL, EOMI, sclera clear, dry mucous membranes, posterior left lateral neck surgical incision noted without surrounding erythema with dressing in place  PULM: CTAB w/o wheezes/crackles, normal respiratory effort, on room air Breast: Right breast with fluctuance and inflammation medial-inferior quadrant, NTTP CV: RRR w/o M/G/R GI: abd soft,  NTND, NABS, no R/G/M MSK: + peripheral edema, moves all extremities independently NEURO: CN II-XII intact, no focal deficits, sensation to light touch intact PSYCH: Depressed mood, flat affect Integumentary: posterior left lateral neck with dressing in place, tender to palpation   Data Reviewed: I have personally reviewed  following labs and imaging studies  CBC: Recent Labs  Lab 10/01/21 2053 10/03/21 0116 10/04/21 0051 10/05/21 0125 10/06/21 0148  WBC 38.9* 37.9* 35.8* 39.3* 35.9*  NEUTROABS  --  35.6*  --   --   --   HGB 8.6* 7.5* 7.1* 7.6* 7.2*  HCT 26.2* 22.8* 21.9* 24.0* 22.3*  MCV 85.6 85.4 87.6 89.6 86.1  PLT 330 315 285 287 102   Basic Metabolic Panel: Recent Labs  Lab 10/01/21 2053 10/03/21 0116 10/04/21 0051 10/05/21 0125 10/06/21 0148  NA 130* 127* 127* 127* 126*  K 2.7* 3.3* 5.0 5.0 4.8  CL 107 104 107 107 100  CO2 14* 13* 12* 11* 15*  GLUCOSE 179* 68* 116* 162* 122*  BUN 66* 64* 63* 63* 65*  CREATININE 4.76* 4.80* 4.90* 5.17* 5.45*  CALCIUM 7.7* 7.6* 7.4* 7.5* 7.8*  MG  --  1.7  --   --   --   PHOS  --   --  5.2* 5.5* 5.1*   GFR: Estimated Creatinine Clearance: 15.1 mL/min (A) (by C-G formula based on SCr of 5.45 mg/dL (H)). Liver Function Tests: Recent Labs  Lab 10/01/21 2336 10/03/21 0116 10/04/21 0051 10/05/21 0125 10/06/21 0148  AST 13* 11*  --   --   --   ALT 19 14  --   --   --   ALKPHOS 310* 242*  --   --   --   BILITOT 0.4 0.6  --   --   --   PROT 5.4* 5.4*  --   --   --   ALBUMIN 1.5* 1.7* <1.5* <1.5* <1.5*   No results for input(s): LIPASE, AMYLASE in the last 168 hours. No results for input(s): AMMONIA in the last 168 hours. Coagulation Profile: Recent Labs  Lab 10/01/21 2205 10/03/21 0116  INR 1.2 1.4*   Cardiac Enzymes: No results for input(s): CKTOTAL, CKMB, CKMBINDEX, TROPONINI in the last 168 hours. BNP (last 3 results) No results for input(s): PROBNP in the last 8760 hours. HbA1C: No results for input(s): HGBA1C in the last 72 hours. CBG: Recent Labs  Lab 10/05/21 1708 10/05/21 2035 10/06/21 0301 10/06/21 0741 10/06/21 1139  GLUCAP 155* 108* 126* 137* 132*   Lipid Profile: No results for input(s): CHOL, HDL, LDLCALC, TRIG, CHOLHDL, LDLDIRECT in the last 72 hours. Thyroid Function Tests: No results for input(s): TSH, T4TOTAL,  FREET4, T3FREE, THYROIDAB in the last 72 hours.  Anemia Panel: Recent Labs    10/04/21 0051  VITAMINB12 2,417*  FOLATE 11.6  FERRITIN 1,192*  TIBC NOT CALCULATED  IRON 22*  RETICCTPCT 3.3*   Sepsis Labs: Recent Labs  Lab 10/01/21 2205 10/01/21 2336 10/03/21 0116  PROCALCITON  --  1.85 1.86  LATICACIDVEN 0.7 0.7  --     Recent Results (from the past 240 hour(s))  Culture, blood (Routine x 2)     Status: None   Collection Time: 10/01/21 10:05 PM   Specimen: BLOOD  Result Value Ref Range Status   Specimen Description BLOOD RIGHT ANTECUBITAL  Final   Special Requests   Final    BOTTLES DRAWN AEROBIC AND ANAEROBIC Blood Culture adequate volume   Culture   Final  NO GROWTH 5 DAYS Performed at [facility], [address] [place], [place] [address]    Report Status 10/06/2021 FINAL  Final  Culture, blood (Routine x 2)     Status: None   Collection Time: 10/01/21 10:05 PM   Specimen: BLOOD  Result Value Ref Range Status   Specimen Description BLOOD BLOOD LEFT HAND  Final   Special Requests   Final    BOTTLES DRAWN AEROBIC AND ANAEROBIC Blood Culture adequate volume   Culture   Final    NO GROWTH 5 DAYS Performed at [facility], [address] [place], [place] [address]    Report Status 10/06/2021 FINAL  Final  Surgical pcr screen     Status: Abnormal   Collection Time: 10/03/21  1:00 PM   Specimen: Nasal Mucosa; Nasal Swab  Result Value Ref Range Status   MRSA, PCR NEGATIVE NEGATIVE Final   Staphylococcus aureus POSITIVE (A) NEGATIVE Final    Comment: (NOTE) The Xpert SA Assay (FDA approved for NASAL specimens in patients 37 years of age and older), is one component of a comprehensive surveillance program. It is not intended to diagnose infection nor to guide or monitor treatment. Performed at [facility], [place] [address] [place], [place] [address]   Aerobic/Anaerobic Culture w Gram Stain (surgical/deep wound)     Status: None (Preliminary result)    Collection Time: 10/04/21  8:44 AM   Specimen: Abscess  Result Value Ref Range Status   Specimen Description ABSCESS NECK  Final   Special Requests NONE  Final   Gram Stain   Final    FEW WBC PRESENT, PREDOMINANTLY PMN MODERATE GRAM POSITIVE COCCI IN PAIRS IN CLUSTERS Performed at [facility], [address], [place], [place] [address]    Culture   Final    MODERATE STAPHYLOCOCCUS AUREUS NO ANAEROBES ISOLATED; CULTURE IN PROGRESS FOR 5 DAYS    Report Status PENDING  Incomplete   Organism ID, Bacteria STAPHYLOCOCCUS AUREUS  Final      Susceptibility   Staphylococcus aureus - MIC*    CIPROFLOXACIN <=0.5 SENSITIVE Sensitive     ERYTHROMYCIN <=0.25 SENSITIVE Sensitive     GENTAMICIN <=0.5 SENSITIVE Sensitive     OXACILLIN 0.5 SENSITIVE Sensitive     TETRACYCLINE <=1 SENSITIVE Sensitive     VANCOMYCIN 1 SENSITIVE Sensitive     TRIMETH/SULFA <=10 SENSITIVE Sensitive     CLINDAMYCIN <=0.25 SENSITIVE Sensitive     RIFAMPIN <=0.5 SENSITIVE Sensitive     Inducible Clindamycin NEGATIVE Sensitive     * MODERATE STAPHYLOCOCCUS AUREUS         Radiology Studies: No results found.      Scheduled Meds:  acetaminophen  650 mg Oral Q6H   calcium acetate  667 mg Oral TID WC   feeding supplement (NEPRO CARB STEADY)  237 mL Oral BID BM   heparin  5,000 Units Subcutaneous Q8H   insulin aspart  0-5 Units Subcutaneous QHS   insulin aspart  0-9 Units Subcutaneous TID WC   mupirocin ointment  1 application. Nasal BID   rosuvastatin  10 mg Oral Daily   Continuous Infusions:   ceFAZolin (ANCEF) IV     sodium bicarbonate 150 mEq in D5W infusion 75 mL/hr at 10/06/21 0049     LOS: 4 days    Time spent: 52 minutes spent on chart review, discussion with nursing staff, consultants, updating family and interview/physical exam; more than 50% of that time was spent in counseling and/or  coordination of care.    Kaitrin Seybold J [place] DO [facility] Available via Epic secure chat  7am-7pm After these hours, please refer to coverage provider listed on [email or web address] 10/06/2021, 12:49 PM

## 2021-10-06 NOTE — Progress Notes (Signed)
Bladder scanned 247ml on pt. No discomfort or distention at this time. Has not voided since having in and out done at [place]. J.Daniel notified.

## 2021-10-06 NOTE — Evaluation (Signed)
Occupational Therapy Evaluation Patient Details Name: Lindsey Murillo MRN: [id number] DOB: 1995/05/24 Today's Date: 10/06/2021   History of Present Illness Lindsey Murillo is a 26 y.o. female  initially presented to [facility] ED on 5/25 with complaints of not feeling well, lower extremity edema, and posterior neck pain with associated drainage.  Mother at bedside assists with HPI given patient's resistance/evasive to questions.  Her mother reports that patient has not been feeling well for the 2 weeks prior and noted an area of the posterior left neck that started draining 2 days prior to admit. Transferred to [facility] on 5/26 for possible surgery.  Pt underwent I&D left neck abscess on 5/28. Pt with severe sepsis.  Also with right breast abscess that is being watched.  PMH: type 1 diabetes mellitus, stage IV CKD with nephrotic syndrome, anemia of chronic medical/renal disease, diabetic neuropathy, HTN   Clinical Impression   Pt reports nausea chest pressure and blurred vision. RN in room and assessing patient. Pt with delayed response to questions and with unsteady static sitting balance. Pt expressed "this foley hurt" pt does not have a foley. Pt expressing need to void without void on bed pan on arrival. Due to symptoms OT terminated session and will monitor chart for appropriateness.   Recommendation for CIR     Recommendations for follow up therapy are one component of a multi-disciplinary discharge planning process, led by the attending physician.  Recommendations may be updated based on patient status, additional functional criteria and insurance authorization.   Follow Up Recommendations  Acute inpatient rehab (3hours/day)    Assistance Recommended at Discharge Intermittent Supervision/Assistance  Patient can return home with the following Two people to help with walking and/or transfers;Two people to help with bathing/dressing/bathroom    Functional Status Assessment  Patient has  had a recent decline in their functional status and demonstrates the ability to make significant improvements in function in a reasonable and predictable amount of time.  Equipment Recommendations  Other (comment) (TBA)    Recommendations for Other Services Rehab consult     Precautions / Restrictions Precautions Precautions: Fall Restrictions Weight Bearing Restrictions: No      Mobility Bed Mobility Overal bed mobility: Needs Assistance Bed Mobility: Supine to Sit, Sit to Supine     Supine to sit: Max assist Sit to supine: Max assist   General bed mobility comments: Pt requires step by step to sequence and progress to static sitting. pt with swing and unsteady sitting balance. pt shifting due to discomfort.    Transfers                   General transfer comment: deferred after report of vision and chest pressure      Balance Overall balance assessment: Needs assistance   Sitting balance-Leahy Scale: Poor                                     ADL either performed or assessed with clinical judgement   ADL Overall ADL's : Needs assistance/impaired                                       General ADL Comments: A for all adls at this time. pt unable to void supine     Vision   Additional Comments: reports blurry vision.  I can't see     Perception     Praxis      Pertinent Vitals/Pain Pain Assessment Pain Assessment: Faces Faces Pain Scale: Hurts whole lot Pain Location: nausea, uncomfortable chest pressure Pain Descriptors / Indicators: Pressure Pain Intervention(s): Monitored during session, Repositioned     Hand Dominance Left   Extremity/Trunk Assessment Upper Extremity Assessment Upper Extremity Assessment: Generalized weakness   Lower Extremity Assessment Lower Extremity Assessment: Defer to PT evaluation   Cervical / Trunk Assessment Cervical / Trunk Assessment: Kyphotic   Communication  Communication Communication: No difficulties   Cognition Arousal/Alertness: Awake/alert Behavior During Therapy: Flat affect Overall Cognitive Status: Impaired/Different from baseline Area of Impairment: Awareness                           Awareness: Intellectual   General Comments: pt able to state month with prolonged time initially wants to respond march but does correct to May. reports hospital. pt states having blurry vision     General Comments  vitals placed in chart by RN    Exercises     Shoulder Instructions      Home Living Family/patient expects to be discharged to:: Private residence Living Arrangements: Parent Available Help at Discharge: Family;Available 24 hours/day Type of Home: House Home Access: Stairs to enter [employer] of Steps: 2 Entrance Stairs-Rails: None Home Layout: One level     Bathroom Shower/Tub: Teacher, early years/pre: Standard Bathroom Accessibility: Yes How Accessible: Accessible via wheelchair Home Equipment: [place] - single point;BSC/3in1;Tub bench   Additional Comments: outpatient [facility] therapy prior to start of abscess      Prior Functioning/Environment Prior Level of Function : Needs assist             Mobility Comments: Mother reports that she assists pt at all times.  She used cane or furniture walked PTA.  Of note, mother just had a right foot surgery as well. ADLs Comments: PRN assist by mother        OT Problem List: Impaired balance (sitting and/or standing);Decreased activity tolerance;Pain      OT Treatment/Interventions: Self-care/ADL training;Therapeutic exercise;Neuromuscular education;Energy conservation;DME and/or AE instruction;Modalities;Manual therapy;Therapeutic activities;Cognitive remediation/compensation;Patient/family education;Balance training    OT Goals(Current goals can be found in the care plan section) Acute Rehab OT Goals Patient Stated Goal: to  throw up OT Goal Formulation: Patient unable to participate in goal setting Time For Goal Achievement: 10/20/21 Potential to Achieve Goals: Good  OT Frequency: Min 2X/week    Co-evaluation              AM-PAC OT "6 Clicks" Daily Activity     Outcome Measure Help from another person eating meals?: A Lot Help from another person taking care of personal grooming?: A Lot Help from another person toileting, which includes using toliet, bedpan, or urinal?: A Lot Help from another person bathing (including washing, rinsing, drying)?: A Lot Help from another person to put on and taking off regular upper body clothing?: A Lot Help from another person to put on and taking off regular lower body clothing?: A Lot 6 Click Score: 12   End of Session Nurse Communication: Mobility status;Precautions  Activity Tolerance: Patient limited by pain;Patient limited by fatigue Patient left: in bed;with call bell/phone within reach;with nursing/sitter in room  OT Visit Diagnosis: Unsteadiness on feet (R26.81);Muscle weakness (generalized) (M62.81)  Time: 5183-3582 OT Time Calculation (min): 20 min Charges:  OT General Charges $OT Visit: 1 Visit OT Evaluation $OT Eval Moderate Complexity: 1 Mod   Brynn, OTR/L  Acute Rehabilitation Services Office: [phone] .   Jeri Modena 10/06/2021, 10:46 AM

## 2021-10-06 NOTE — Plan of Care (Signed)
  Problem: Clinical Measurements: Goal: Respiratory complications will improve Outcome: Progressing Goal: Cardiovascular complication will be avoided Outcome: Progressing   Problem: Activity: Goal: Risk for activity intolerance will decrease Outcome: Progressing   Problem: Nutrition: Goal: Adequate nutrition will be maintained Outcome: Progressing   

## 2021-10-06 NOTE — Progress Notes (Signed)
Antimicrobial stewardship note  Next abscess culture with MSSA.  Patient currently receiving linezolid.   Asked Dr. [place] to change to cefazolin given culture results.  Orders received for cefazolin 1g IV q12h, adjusted for her renal impairment.  Heide Guile, PharmD, [place] Clinical Pharmacist Phone [phone]

## 2021-10-06 NOTE — Progress Notes (Signed)
Pt reports mid intermittent chest pain 5/10 unable to describe type, just states "pain". Pain worsens with movement and coughing. Vital signs stable. No radiation to other areas. James Daniels-NP notified, pt with similar report earlier today, with immediate relief noted and unremarkable EKG.  Pt noted resistive to taking pain medications other than scheduled tylenol. Per Jamison Neighbor attempt to give pain medication and monitor for relief. Rapid response nurse-Dave,RN also notified and on unit to assess. Will continue to monitor for relief or worsening condition.

## 2021-10-06 NOTE — Progress Notes (Signed)
Progress Note  2 Days Post-Op  Subjective: Pt not really contributory, mother at bedside and reports she has still been having pain in left neck. Reports right breast changes are chronic, no acute drainage from this. She reports patient has not been having good UOP.   Objective: Vital signs in last 24 hours: Temp:  [97.5 F (36.4 C)-98.3 F (36.8 C)] 97.6 F (36.4 C) (05/30 0801) Pulse Rate:  [84-91] 84 (05/30 0801) Resp:  [16-17] 16 (05/30 0801) BP: (86-104)/(45-70) 91/45 (05/30 0801) SpO2:  [96 %-100 %] 100 % (05/30 0801) Last BM Date : 10/02/21  Intake/Output from previous day: 05/29 0701 - 05/30 0700 In: 1241.6 [P.O.:240; I.V.:701.6; IV Piggyback:300] Out: 350 [Urine:350] Intake/Output this shift: No intake/output data recorded.  PE: General:  WD, chronically ill appearing female who is laying in bed in NAD Neck: posterior left neck wound as pictured below with fibrinous appearing exudate in wound base and some surrounding erythema still present   Heart: regular, rate, and rhythm.   Lungs:  Respiratory effort nonlabored Breast: chronic skin changes of right breast with quarter sized scab present medial to right nipple    Lab Results:  Recent Labs    10/05/21 0125 10/06/21 0148  WBC 39.3* 35.9*  HGB 7.6* 7.2*  HCT 24.0* 22.3*  PLT 287 304   BMET Recent Labs    10/05/21 0125 10/06/21 0148  NA 127* 126*  K 5.0 4.8  CL 107 100  CO2 11* 15*  GLUCOSE 162* 122*  BUN 63* 65*  CREATININE 5.17* 5.45*  CALCIUM 7.5* 7.8*   PT/INR No results for input(s): LABPROT, INR in the last 72 hours. CMP     Component Value Date/Time   NA 126 (L) 10/06/2021 0148   NA 132 (L) 09/24/2021 1453   K 4.8 10/06/2021 0148   CL 100 10/06/2021 0148   CO2 15 (L) 10/06/2021 0148   GLUCOSE 122 (H) 10/06/2021 0148   BUN 65 (H) 10/06/2021 0148   BUN 36 (H) 09/24/2021 1453   CREATININE 5.45 (H) 10/06/2021 0148   CREATININE 0.98 08/29/2019 1637   CALCIUM 7.8 (L) 10/06/2021  0148   PROT 5.4 (L) 10/03/2021 0116   PROT 5.5 (L) 09/24/2021 1453   ALBUMIN <1.5 (L) 10/06/2021 0148   ALBUMIN 2.1 (L) 09/24/2021 1453   AST 11 (L) 10/03/2021 0116   ALT 14 10/03/2021 0116   ALKPHOS 242 (H) 10/03/2021 0116   BILITOT 0.6 10/03/2021 0116   BILITOT <0.2 09/24/2021 1453   GFRNONAA 10 (L) 10/06/2021 0148   GFRAA >60 10/27/2019 2021   Lipase     Component Value Date/Time   LIPASE 34 04/12/2017 1014       Studies/Results: No results found.  Anti-infectives: Anti-infectives (From admission, onward)    Start     Dose/Rate Route Frequency Ordered Stop   10/04/21 2200  ceFEPIme (MAXIPIME) 1 g in sodium chloride 0.9 % 100 mL IVPB  Status:  Discontinued        1 g 200 mL/hr over 30 Minutes Intravenous Every 24 hours 10/04/21 1011 10/05/21 1343   10/03/21 2200  vancomycin (VANCOREADY) IVPB 750 mg/150 mL  Status:  Discontinued        750 mg 150 mL/hr over 60 Minutes Intravenous Every 48 hours 10/02/21 0033 10/02/21 1144   10/03/21 1000  linezolid (ZYVOX) IVPB 600 mg        600 mg 300 mL/hr over 60 Minutes Intravenous Every 12 hours 10/02/21 1144  10/02/21 2200  ceFEPIme (MAXIPIME) 2 g in sodium chloride 0.9 % 100 mL IVPB  Status:  Discontinued        2 g 200 mL/hr over 30 Minutes Intravenous Every 24 hours 10/02/21 0033 10/04/21 1011   10/02/21 0000  ceFEPIme (MAXIPIME) 2 g in sodium chloride 0.9 % 100 mL IVPB        2 g 200 mL/hr over 30 Minutes Intravenous  Once 10/01/21 2358 10/02/21 0246   10/02/21 0000  metroNIDAZOLE (FLAGYL) IVPB 500 mg        500 mg 100 mL/hr over 60 Minutes Intravenous  Once 10/01/21 2358 10/02/21 0246   10/02/21 0000  vancomycin (VANCOCIN) IVPB 1000 mg/200 mL premix        1,000 mg 200 mL/hr over 60 Minutes Intravenous  Once 10/01/21 2358 10/02/21 0244        Assessment/Plan Left neck abscess  POD2 s/p I&D - BID wet to dry dressing to small posterior left neck wound - Cx with Staph aureus, sensitivities pending  - continue  abx - no further debridement planned at this time Chronic R breast abscess - appears stable on [place] compared from CT 2 years ago but will check with radiology since final read is pending, chronic skin changes - no acute intervention planned   FEN: CM diet  VTE: SQH ID: linezolid 5/27>>  - below per TRH -  T1DM CKD stage IV Anemia of CKD Diabetic neuropathy HTN  LOS: 4 days   I reviewed Right breast US, CBC, BMET, wound culture.   Norm Parcel, [facility] Surgery 10/06/2021, 9:27 AM Please see Amion for pager number during day hours 7:00am-4:30pm

## 2021-10-07 ENCOUNTER — Inpatient Hospital Stay (HOSPITAL_COMMUNITY): Payer: 59

## 2021-10-07 ENCOUNTER — Other Ambulatory Visit (HOSPITAL_COMMUNITY): Payer: 59

## 2021-10-07 DIAGNOSIS — A419 Sepsis, unspecified organism: Secondary | ICD-10-CM | POA: Diagnosis not present

## 2021-10-07 DIAGNOSIS — N179 Acute kidney failure, unspecified: Secondary | ICD-10-CM | POA: Diagnosis not present

## 2021-10-07 DIAGNOSIS — E871 Hypo-osmolality and hyponatremia: Secondary | ICD-10-CM

## 2021-10-07 DIAGNOSIS — R5381 Other malaise: Secondary | ICD-10-CM

## 2021-10-07 DIAGNOSIS — R6520 Severe sepsis without septic shock: Secondary | ICD-10-CM | POA: Diagnosis not present

## 2021-10-07 DIAGNOSIS — N184 Chronic kidney disease, stage 4 (severe): Secondary | ICD-10-CM

## 2021-10-07 LAB — URINALYSIS, ROUTINE W REFLEX MICROSCOPIC
Bilirubin Urine: NEGATIVE
Glucose, UA: 50 mg/dL — AB
Ketones, ur: NEGATIVE mg/dL
Nitrite: NEGATIVE
Protein, ur: 100 mg/dL — AB
Specific Gravity, Urine: 1.011 (ref 1.005–1.030)
WBC, UA: >50 WBC/hpf — ABNORMAL HIGH (ref 0–5)
pH: 5 (ref 5.0–8.0)

## 2021-10-07 LAB — RENAL FUNCTION PANEL
Albumin: <1.5 g/dL — ABNORMAL LOW (ref 3.5–5.0)
Anion gap: 9 (ref 5–15)
BUN: 63 mg/dL — ABNORMAL HIGH (ref 6–20)
CO2: 16 mmol/L — ABNORMAL LOW (ref 22–32)
Calcium: 7.4 mg/dL — ABNORMAL LOW (ref 8.9–10.3)
Chloride: 96 mmol/L — ABNORMAL LOW (ref 98–111)
Creatinine, Ser: 5.41 mg/dL — ABNORMAL HIGH (ref 0.44–1.00)
GFR, Estimated: 10 mL/min — ABNORMAL LOW (ref >60)
Glucose, Bld: 103 mg/dL — ABNORMAL HIGH (ref 70–99)
Phosphorus: 5 mg/dL — ABNORMAL HIGH (ref 2.5–4.6)
Potassium: 4.5 mmol/L (ref 3.5–5.1)
Sodium: 121 mmol/L — ABNORMAL LOW (ref 135–145)

## 2021-10-07 LAB — CBC
HCT: 22.9 % — ABNORMAL LOW (ref 36.0–46.0)
Hemoglobin: 7.4 g/dL — ABNORMAL LOW (ref 12.0–15.0)
MCH: 27.8 pg (ref 26.0–34.0)
MCHC: 32.3 g/dL (ref 30.0–36.0)
MCV: 86.1 fL (ref 80.0–100.0)
Platelets: 286 10*3/uL (ref 150–400)
RBC: 2.66 MIL/uL — ABNORMAL LOW (ref 3.87–5.11)
RDW: 16.5 % — ABNORMAL HIGH (ref 11.5–15.5)
WBC: 33.2 10*3/uL — ABNORMAL HIGH (ref 4.0–10.5)
nRBC: 0.1 % (ref 0.0–0.2)

## 2021-10-07 LAB — GLUCOSE, CAPILLARY
Glucose-Capillary: 110 mg/dL — ABNORMAL HIGH (ref 70–99)
Glucose-Capillary: 136 mg/dL — ABNORMAL HIGH (ref 70–99)
Glucose-Capillary: 122 mg/dL — ABNORMAL HIGH (ref 70–99)
Glucose-Capillary: 131 mg/dL — ABNORMAL HIGH (ref 70–99)
Glucose-Capillary: 117 mg/dL — ABNORMAL HIGH (ref 70–99)
Glucose-Capillary: 116 mg/dL — ABNORMAL HIGH (ref 70–99)

## 2021-10-07 LAB — OSMOLALITY, URINE: Osmolality, Ur: 240 mOsm/kg — ABNORMAL LOW (ref 300–900)

## 2021-10-07 LAB — SODIUM, URINE, RANDOM: Sodium, Ur: 36 mmol/L

## 2021-10-07 LAB — OSMOLALITY: Osmolality: 276 mOsm/kg (ref 275–295)

## 2021-10-07 LAB — ALBUMIN: Albumin: <1.5 g/dL — ABNORMAL LOW (ref 3.5–5.0)

## 2021-10-07 MED ORDER — INSULIN ASPART 100 UNIT/ML IJ SOLN
0.0000 [IU] | INTRAMUSCULAR | Status: DC
  Administered 2021-10-10 – 2021-10-15 (×10): 1 [IU] via SUBCUTANEOUS
  Administered 2021-10-15 – 2021-10-16 (×3): 2 [IU] via SUBCUTANEOUS
  Administered 2021-10-16: 1 [IU] via SUBCUTANEOUS

## 2021-10-07 MED ORDER — ALBUMIN HUMAN 25 % IV SOLN
25.0000 g | Freq: Two times a day (BID) | INTRAVENOUS | Status: AC
  Administered 2021-10-07 – 2021-10-09 (×6): 25 g via INTRAVENOUS
  Filled 2021-10-07 (×7): qty 100

## 2021-10-07 MED ORDER — DARBEPOETIN ALFA 60 MCG/0.3ML IJ SOSY
60.0000 ug | PREFILLED_SYRINGE | INTRAMUSCULAR | Status: DC
  Administered 2021-10-07 – 2021-10-14 (×2): 60 ug via SUBCUTANEOUS
  Filled 2021-10-07 (×3): qty 0.3

## 2021-10-07 MED ORDER — FUROSEMIDE 10 MG/ML IJ SOLN
60.0000 mg | Freq: Two times a day (BID) | INTRAMUSCULAR | Status: DC
  Administered 2021-10-07 – 2021-10-08 (×2): 60 mg via INTRAVENOUS
  Filled 2021-10-07: qty 6

## 2021-10-07 MED ORDER — INSULIN GLARGINE-YFGN 100 UNIT/ML ~~LOC~~ SOLN: 5.0000 [IU] | Freq: Every day | SUBCUTANEOUS | Status: DC

## 2021-10-07 MED ORDER — CHLORHEXIDINE GLUCONATE CLOTH 2 % EX PADS
6.0000 | MEDICATED_PAD | Freq: Every day | CUTANEOUS | Status: DC
Start: 2021-10-07 — End: 2021-10-23
  Administered 2021-10-07 – 2021-10-22 (×15): 6 via TOPICAL

## 2021-10-07 MED ORDER — SODIUM BICARBONATE 650 MG PO TABS
1300.0000 mg | ORAL_TABLET | Freq: Three times a day (TID) | ORAL | Status: DC
  Administered 2021-10-07 – 2021-10-20 (×38): 1300 mg via ORAL
  Filled 2021-10-07 (×39): qty 2

## 2021-10-07 NOTE — Progress Notes (Addendum)
Inpatient Rehabilitation Admissions Coordinator   I met with patient with her Mom at bedside. Pt up in chair with eyes closed but interactive as I spoke. I discussed goals and expectations of a possible Cir admit as well as need to participate with more intensive therapy. Both are in agreement to pursue Cir admit. She was receiving outpatient therapy recently and also lymphedema management as an outpatient per Chart Review.  I await further progress with therapy before pursuing Auth. Acute team and TOC made aware.  Danne Baxter, RN, MSN Rehab Admissions Coordinator [phone] 10/07/2021 11:46 AM

## 2021-10-07 NOTE — Hospital Course (Addendum)
[name] is Lindsey Murillo 26 y.o. female with past medical history significant for is Lindsey Murillo 26 year old female with past medical history significant for type 1 diabetes mellitus, stage IV CKD with nephrotic syndrome, anemia of chronic medical/renal disease, diabetic neuropathy, HTN who initially presented to [facility] ED on 5/25 with complaints of not feeling well, lower extremity edema, and posterior neck pain with associated drainage.  Mother at bedside assists with HPI given patient's resistance/evasive to questions.  Her mother reports that patient has not been feeling well for the last 2 weeks and noted [name] area of the posterior left neck that started draining 2 days prior.  Patient/mother denied any trauma to that region.  Pain is worse with any type of range of motion of the neck, nothing seems to make it better.  Mother put some ointment on it which offered her no relief.   In the ED, temperature 98.2 F, HR 102, RR 17, BP 96/67, SPO2 100% on room air.  Sodium 130, potassium 2.7, chloride 107, CO2 14, glucose 179, BUN 66, creatinine 4.76.  BNP 626.0.  WBC 38.9, hemoglobin 8.6, platelet count 330.  Lactic acid 0.7, procalcitonin 1.85.  INR 1.2.  Urinalysis with trace leukocytes, negative nitrite, rare bacteria, 11-20 WBCs.  Pregnancy test negative.  Chest x-ray with cardiomegaly, no active cardiopulmonary disease process.  CT soft tissue neck with area of phlegmon/abscess measuring 4 x 2.4 x 4.8 cm localized upper left neck with enlarged left upper cervical chain lymph nodes.  Blood cultures x2 were drawn.  Patient was started on vancomycin, cefepime, Flagyl.  Initially admitted to [facility].  Valuated by general surgery, Dr. Arnoldo Morale and requested transfer to [facility] higher level of care due to multiple comorbidities.  Case was discussed with ENT, Dr. Benjamine Mola; and location was out of scope of ENT and recommended transfer to [facility] for general surgery evaluation.  Patient was transferred to [facility] and  admitted to the hospital service with general surgery in consultation.   She's now s/p I&D of L side of neck.  Surgery signed off, plan for 10-14 days abx.  R breast without evidence of abscess, plan for outpatient follow up, 10 days doxycycline.  Renal following for aki on CKD, tunneled HD cath placed today.

## 2021-10-07 NOTE — Plan of Care (Signed)
  Problem: Clinical Measurements: Goal: Ability to maintain clinical measurements within normal limits will improve Outcome: Progressing Goal: Will remain free from infection Outcome: Progressing Goal: Diagnostic test results will improve Outcome: Progressing Goal: Respiratory complications will improve Outcome: Progressing Goal: Cardiovascular complication will be avoided Outcome: Progressing   Problem: Activity: Goal: Risk for activity intolerance will decrease 10/07/2021 2054 by Matt Holmes, RN Outcome: Progressing 10/07/2021 2053 by Matt Holmes, RN Outcome: Progressing   Problem: Nutrition: Goal: Adequate nutrition will be maintained 10/07/2021 2054 by Matt Holmes, RN Outcome: Progressing 10/07/2021 2053 by Matt Holmes, RN Outcome: Progressing   Problem: Safety: Goal: Ability to remain free from injury will improve 10/07/2021 2054 by Matt Holmes, RN Outcome: Progressing 10/07/2021 2053 by Matt Holmes, RN Outcome: Progressing   Problem: Skin Integrity: Goal: Risk for impaired skin integrity will decrease 10/07/2021 2054 by Matt Holmes, RN Outcome: Progressing 10/07/2021 2053 by Matt Holmes, RN Outcome: Progressing   Problem: Education: Goal: Ability to describe self-care measures that may prevent or decrease complications (Diabetes Survival Skills Education) will improve Outcome: Progressing Goal: Individualized Educational Video(s) Outcome: Progressing   Problem: Coping: Goal: Ability to adjust to condition or change in health will improve Outcome: Progressing   Problem: Fluid Volume: Goal: Ability to maintain a balanced intake and output will improve Outcome: Progressing   Problem: Health Behavior/Discharge Planning: Goal: Ability to identify and utilize available resources and services will improve Outcome: Progressing Goal: Ability to manage health-related needs will improve Outcome: Progressing   Problem: Metabolic: Goal:  Ability to maintain appropriate glucose levels will improve Outcome: Progressing   Problem: Nutritional: Goal: Maintenance of adequate nutrition will improve Outcome: Progressing Goal: Progress toward achieving an optimal weight will improve Outcome: Progressing   Problem: Skin Integrity: Goal: Risk for impaired skin integrity will decrease Outcome: Progressing   Problem: Tissue Perfusion: Goal: Adequacy of tissue perfusion will improve Outcome: Progressing   Problem: Education: Goal: Knowledge of General Education information will improve Description: Including pain rating scale, medication(s)/side effects and non-pharmacologic comfort measures 10/07/2021 2054 by Matt Holmes, RN Outcome: Not Progressing 10/07/2021 2053 by Matt Holmes, RN Outcome: Progressing   Problem: Health Behavior/Discharge Planning: Goal: Ability to manage health-related needs will improve 10/07/2021 2054 by Matt Holmes, RN Outcome: Not Progressing 10/07/2021 2053 by Matt Holmes, RN Outcome: Progressing   Problem: Coping: Goal: Level of anxiety will decrease 10/07/2021 2054 by Matt Holmes, RN Outcome: Not Progressing 10/07/2021 2053 by Matt Holmes, RN Outcome: Progressing   Problem: Elimination: Goal: Will not experience complications related to bowel motility 10/07/2021 2054 by Matt Holmes, RN Outcome: Not Progressing 10/07/2021 2053 by Matt Holmes, RN Outcome: Progressing Goal: Will not experience complications related to urinary retention 10/07/2021 2054 by Matt Holmes, RN Outcome: Not Progressing 10/07/2021 2053 by Matt Holmes, RN Outcome: Progressing   Problem: Pain Managment: Goal: General experience of comfort will improve 10/07/2021 2054 by Matt Holmes, RN Outcome: Not Progressing 10/07/2021 2053 by Matt Holmes, RN Outcome: Progressing

## 2021-10-07 NOTE — Assessment & Plan Note (Addendum)
Patient seen by PT during the hospitalization, patient improved slowly and patient with discharge home with home health therapies.

## 2021-10-07 NOTE — Progress Notes (Signed)
Progress Note  3 Days Post-Op  Subjective: Pain slightly improved. Pt still minimally participatory.   Objective: Vital signs in last 24 hours: Temp:  [98.1 F (36.7 C)-98.5 F (36.9 C)] 98.1 F (36.7 C) (05/31 0740) Pulse Rate:  [83-87] 87 (05/31 0740) Resp:  [16-18] 16 (05/31 0740) BP: (90-113)/(54-79) 91/59 (05/31 0740) SpO2:  [99 %-100 %] 99 % (05/31 0740) Last BM Date : 10/02/21  Intake/Output from previous day: 05/30 0701 - 05/31 0700 In: -  Out: 500 [Urine:500] Intake/Output this shift: No intake/output data recorded.  PE: General:  WD, chronically ill appearing female who is laying in bed in NAD Neck: posterior left neck wound with fibrinous appearing exudate in wound base and some surrounding erythema still present  Lungs:  Respiratory effort nonlabored   Lab Results:  Recent Labs    10/06/21 0148 10/07/21 0230  WBC 35.9* 33.2*  HGB 7.2* 7.4*  HCT 22.3* 22.9*  PLT 304 286    BMET Recent Labs    10/06/21 0148 10/07/21 0230  NA 126* 121*  K 4.8 4.5  CL 100 96*  CO2 15* 16*  GLUCOSE 122* 103*  BUN 65* 63*  CREATININE 5.45* 5.41*  CALCIUM 7.8* 7.4*    PT/INR No results for input(s): LABPROT, INR in the last 72 hours. CMP     Component Value Date/Time   NA 121 (L) 10/07/2021 0230   NA 132 (L) 09/24/2021 1453   K 4.5 10/07/2021 0230   CL 96 (L) 10/07/2021 0230   CO2 16 (L) 10/07/2021 0230   GLUCOSE 103 (H) 10/07/2021 0230   BUN 63 (H) 10/07/2021 0230   BUN 36 (H) 09/24/2021 1453   CREATININE 5.41 (H) 10/07/2021 0230   CREATININE 0.98 08/29/2019 1637   CALCIUM 7.4 (L) 10/07/2021 0230   PROT 5.4 (L) 10/03/2021 0116   PROT 5.5 (L) 09/24/2021 1453   ALBUMIN <1.5 (L) 10/07/2021 0230   ALBUMIN 2.1 (L) 09/24/2021 1453   AST 11 (L) 10/03/2021 0116   ALT 14 10/03/2021 0116   ALKPHOS 242 (H) 10/03/2021 0116   BILITOT 0.6 10/03/2021 0116   BILITOT <0.2 09/24/2021 1453   GFRNONAA 10 (L) 10/07/2021 0230   GFRAA >60 10/27/2019 2021    Lipase     Component Value Date/Time   LIPASE 34 04/12/2017 1014       Studies/Results: No results found.  Anti-infectives: Anti-infectives (From admission, onward)    Start     Dose/Rate Route Frequency Ordered Stop   10/06/21 2200  ceFAZolin (ANCEF) IVPB 1 g/50 mL premix        1 g 100 mL/hr over 30 Minutes Intravenous Every 12 hours 10/06/21 1052     10/04/21 2200  ceFEPIme (MAXIPIME) 1 g in sodium chloride 0.9 % 100 mL IVPB  Status:  Discontinued        1 g 200 mL/hr over 30 Minutes Intravenous Every 24 hours 10/04/21 1011 10/05/21 1343   10/03/21 2200  vancomycin (VANCOREADY) IVPB 750 mg/150 mL  Status:  Discontinued        750 mg 150 mL/hr over 60 Minutes Intravenous Every 48 hours 10/02/21 0033 10/02/21 1144   10/03/21 1000  linezolid (ZYVOX) IVPB 600 mg  Status:  Discontinued        600 mg 300 mL/hr over 60 Minutes Intravenous Every 12 hours 10/02/21 1144 10/06/21 1052   10/02/21 2200  ceFEPIme (MAXIPIME) 2 g in sodium chloride 0.9 % 100 mL IVPB  Status:  Discontinued  2 g 200 mL/hr over 30 Minutes Intravenous Every 24 hours 10/02/21 0033 10/04/21 1011   10/02/21 0000  ceFEPIme (MAXIPIME) 2 g in sodium chloride 0.9 % 100 mL IVPB        2 g 200 mL/hr over 30 Minutes Intravenous  Once 10/01/21 2358 10/02/21 0246   10/02/21 0000  metroNIDAZOLE (FLAGYL) IVPB 500 mg        500 mg 100 mL/hr over 60 Minutes Intravenous  Once 10/01/21 2358 10/02/21 0246   10/02/21 0000  vancomycin (VANCOCIN) IVPB 1000 mg/200 mL premix        1,000 mg 200 mL/hr over 60 Minutes Intravenous  Once 10/01/21 2358 10/02/21 0244        Assessment/Plan Left neck abscess  POD2 s/p I&D - continue BID wet to dry dressing to small posterior left neck wound - Cx with MSSA - narrowed to ancef yesterday - no further debridement planned at this time - will see again Friday if remains admitted Chronic R breast abscess - [place] read finalized and no acute drainable collection - no acute  intervention planned, recommend outpatient follow up with PCP  FEN: CM diet  VTE: SQH ID: linezolid 5/27>5/30; Ancef 5/30>>  - below per TRH -  T1DM CKD stage IV Anemia of CKD Diabetic neuropathy HTN  LOS: 5 days   I reviewed CBC, BMET, wound culture.   Norm Parcel, [facility] Surgery 10/07/2021, 8:41 AM Please see Amion for pager number during day hours 7:00am-4:30pm

## 2021-10-07 NOTE — Assessment & Plan Note (Addendum)
Creatinine 2.95 by day of discharge.  Baseline ~3 Renal US negative for hydronephrosis.  Appearred overloaded early on in the hospitalization, LE edema appears to be improving gradually with diuresis and HD. Renal consulted, will follow the patient throughout the hospitalization- temp HD cath placed  Diuresis with albumin given edema/nephrotic syndrome -Patient also underwent hemodialysis on 10/14/2021, 10/15/2021 per nephrology. 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 -Per nephrology will eventually need tunneled HD catheter and AVF/AVG placement once infection under control. -Patient assessed by interventional radiology s/p placement of dialysis catheter Monday, 10/19/2021. -Patient sent in for hemodialysis in the outpatient setting on Monday Wednesday Friday.  First dialysis session will be tomorrow 10/13/2021.  -Outpatient follow-up with nephrology.

## 2021-10-07 NOTE — Progress Notes (Signed)
Mobility Specialist Progress Note:   10/07/21 1430  Mobility  Activity Transferred to/from [facility]  Level of Assistance Moderate assist, patient does 50-74% (+2)  Assistive Device Other (Comment) (HHA)  Activity Response Tolerated fair  $Mobility charge 1 Mobility   Pt requesting to transfer to [facility] for BM. Became bowel incontinent while in chair. Required heavy modA of 2 to transfer. Pt left on BSC with call bell in reach.   Nelta Numbers Acute Rehab Secure Chat or Office Phone: [phone]

## 2021-10-07 NOTE — Assessment & Plan Note (Addendum)
[place] without definite or focal fluid collection Needs outpatient follow up, this has been arranged by surgery -> follow up with surgery and at breast clinic Surgery recommended 10 days doxy which patient completed during the hospitalization.

## 2021-10-07 NOTE — Assessment & Plan Note (Addendum)
Overloaded - improved with diuresis and HD.

## 2021-10-07 NOTE — Progress Notes (Signed)
Physical Therapy Treatment Patient Details Name: Lindsey Murillo MRN: [id number] DOB: Jun 06, 1995 Today's Date: 10/07/2021   History of Present Illness Lindsey Murillo is a 26 y.o. female  initially presented to [facility] ED on 5/25 with complaints of not feeling well, lower extremity edema, and posterior neck pain with associated drainage.  Mother at bedside assists with HPI given patient's resistance/evasive to questions.  Her mother reports that patient has not been feeling well for the 2 weeks prior and noted an area of the posterior left neck that started draining 2 days prior to admit. Transferred to [facility] on 5/26 for possible surgery.  Pt underwent I&D left neck abscess on 5/28. Pt with severe sepsis.  Also with right breast abscess that is being watched.  PMH: type 1 diabetes mellitus, stage IV CKD with nephrotic syndrome, anemia of chronic medical/renal disease, diabetic neuropathy, HTN    PT Comments    Pt is making good, steady progress with mobility as she was able to transfer to stand with modAx1 using the stedy today. She remains limited in lower extremity and core strength and safety awareness, often losing balance in all directions in static sitting or standing. She is at high risk for falls. Session limited by performing pericare after BM and by transport arriving to take pt to imaging. Will continue to follow acutely. Current recommendations remain appropriate.    Recommendations for follow up therapy are one component of a multi-disciplinary discharge planning process, led by the attending physician.  Recommendations may be updated based on patient status, additional functional criteria and insurance authorization.  Follow Up Recommendations  Acute inpatient rehab (3hours/day)     Assistance Recommended at Discharge Frequent or constant Supervision/Assistance  Patient can return home with the following Two people to help with walking and/or transfers;A lot of help with  bathing/dressing/bathroom;Assistance with cooking/housework;Direct supervision/assist for medications management;Direct supervision/assist for financial management;Assist for transportation;Help with stairs or ramp for entrance   Equipment Recommendations  Rolling walker (2 wheels);Other (comment) (stedy/mechanical lift pending progress)    Recommendations for Other Services       Precautions / Restrictions Precautions Precautions: Fall Restrictions Weight Bearing Restrictions: No     Mobility  Bed Mobility Overal bed mobility: Needs Assistance Bed Mobility: Sit to Supine       Sit to supine: Max assist   General bed mobility comments: Cues to prop herself on her L elbow to control her trunk to return to supine, maxA to manage legs and scoot trunk laterally in bed to obtain midline.    Transfers Overall transfer level: Needs assistance Equipment used: Ambulation equipment used Transfers: Sit to/from Stand, Bed to chair/wheelchair/BSC Sit to Stand: Mod assist           General transfer comment: Pt pulling up to stand 1x from commode and 1x from stedy flaps inside the stedy, needing modA to shift weight anteriorly and power up to stand. Stedy used to transfer pt commode > bed. Session limited by transport arriving to take pt to imaging. Transfer via Lift Equipment: Stedy  Ambulation/Gait               General Gait Details: deferred   Stairs             Wheelchair Mobility    Modified Rankin (Stroke Patients Only)       Balance Overall balance assessment: Needs assistance Sitting-balance support: Single extremity supported, No upper extremity supported, Feet supported Sitting balance-Leahy Scale: Poor Sitting balance -  Comments: Pt with LOB of trunk in all directions, needing min guard-minA and intermittent UE support for static balance.   Standing balance support: Bilateral upper extremity supported Standing balance-Leahy Scale: Poor Standing  balance comment: MinA and biL UE support in stedy to stand 2x >30 sec each. Pt with trunk sway                            Cognition Arousal/Alertness: Awake/alert Behavior During Therapy: Flat affect Overall Cognitive Status: Impaired/Different from baseline Area of Impairment: Awareness, Following commands, Safety/judgement, Problem solving, Attention                   Current Attention Level: Sustained   Following Commands: Follows one step commands inconsistently, Follows one step commands with increased time Safety/Judgement: Decreased awareness of safety, Decreased awareness of deficits Awareness: Intellectual Problem Solving: Slow processing, Difficulty sequencing, Decreased initiation, Requires verbal cues, Requires tactile cues General Comments: Pt minimally verbal during session with flat affect. Pt following simple commands after delayed processing and repeated cues. Poor awareness into her deficits, often displaying poor control of her trunk sitting up and appearing to fall asleep at times.        Exercises      General Comments General comments (skin integrity, edema, etc.): grandmother and mother present and supportive during session      Pertinent Vitals/Pain Pain Assessment Pain Assessment: Faces Faces Pain Scale: Hurts little more Pain Location: grimacing during session with mobility Pain Descriptors / Indicators: Grimacing Pain Intervention(s): Monitored during session, Limited activity within patient's tolerance, Repositioned    Home Living                          Prior Function            PT Goals (current goals can now be found in the care plan section) Acute Rehab PT Goals Patient Stated Goal: did not state other than to return to bed PT Goal Formulation: With patient/family Time For Goal Achievement: 10/19/21 Potential to Achieve Goals: Fair Progress towards PT goals: Progressing toward goals    Frequency    Min  3X/week      PT Plan Equipment recommendations need to be updated    Co-evaluation              AM-PAC PT "6 Clicks" Mobility   Outcome Measure  Help needed turning from your back to your side while in a flat bed without using bedrails?: A Little Help needed moving from lying on your back to sitting on the side of a flat bed without using bedrails?: A Lot Help needed moving to and from a bed to a chair (including a wheelchair)?: Total Help needed standing up from a chair using your arms (e.g., wheelchair or bedside chair)?: A Lot Help needed to walk in hospital room?: Total Help needed climbing 3-5 steps with a railing? : Total 6 Click Score: 10    End of Session Equipment Utilized During Treatment: Gait belt Activity Tolerance: Patient limited by fatigue Patient left: in bed;with family/visitor present;Other (comment) (with transport taking pt to imaging) Nurse Communication: Mobility status;Need for lift equipment (NT) PT Visit Diagnosis: Unsteadiness on feet (R26.81);Muscle weakness (generalized) (M62.81);Pain;Difficulty in walking, not elsewhere classified (R26.2)     Time: 1761-6073 PT Time Calculation (min) (ACUTE ONLY): 29 min  Charges:  $Therapeutic Activity: 23-37 mins  Moishe Spice, PT, DPT Acute Rehabilitation Services  Pager: [phone] Office: [place] 10/07/2021, 3:19 PM

## 2021-10-07 NOTE — Progress Notes (Signed)
No further complaints of chest pain. Intermittent restlessness noted. Reports being "uncomfortable", repositioned by staff, some relief noted.

## 2021-10-07 NOTE — Progress Notes (Addendum)
PROGRESS NOTE    Lindsey Murillo  FYB:[id number] DOB: Oct 22, 1995 DOA: 10/01/2021 PCP: Coral Spikes, DO  Chief Complaint  Patient presents with   Leg Swelling    X 2 weeks     Brief Narrative:  Lindsey Murillo is Lindsey Murillo 26 y.o. Murillo with past medical history significant for is Lindsey Murillo 26 year old Murillo with past medical history significant for type 1 diabetes mellitus, stage IV CKD with nephrotic syndrome, anemia of chronic medical/renal disease, diabetic neuropathy, HTN who initially presented to [facility] ED on 5/25 with complaints of not feeling well, lower extremity edema, and posterior neck pain with associated drainage.  Mother at bedside assists with HPI given patient's resistance/evasive to questions.  Her mother reports that patient has not been feeling well for the last 2 weeks and noted Lindsey Murillo area of the posterior left neck that started draining 2 days prior.  Patient/mother denied any trauma to that region.  Pain is worse with any type of range of motion of the neck, nothing seems to make it better.  Mother put some ointment on it which offered her no relief.   In the ED, temperature 98.2 F, HR 102, RR 17, BP 96/67, SPO2 100% on room air.  Sodium 130, potassium 2.7, chloride 107, CO2 14, glucose 179, BUN 66, creatinine 4.76.  BNP 626.0.  WBC 38.9, hemoglobin 8.6, platelet count 330.  Lactic acid 0.7, procalcitonin 1.85.  INR 1.2.  Urinalysis with trace leukocytes, negative nitrite, rare bacteria, 11-20 WBCs.  Pregnancy test negative.  Chest x-ray with cardiomegaly, no active cardiopulmonary disease process.  CT soft tissue neck with area of phlegmon/abscess measuring 4 x 2.4 x 4.8 cm localized upper left neck with enlarged left upper cervical chain lymph nodes.  Blood cultures x2 were drawn.  Patient was started on vancomycin, cefepime, Flagyl.  Initially admitted to [facility].  Valuated by general surgery, Dr. Arnoldo Morale and requested transfer to [facility] higher level of care due to multiple  comorbidities.  Case was discussed with ENT, Dr. Benjamine Mola; and location was out of scope of ENT and recommended transfer to [facility] for general surgery evaluation.  Patient was transferred to [facility] and admitted to the hospital service with general surgery in consultation.       Assessment & Plan:   Principal Problem:   Sepsis ([place]) Active Problems:   Abscess of breast, right   Uncontrolled type 1 diabetes mellitus with hyperglycemia ([name])   AKI (acute kidney injury) ([place])   Nephrotic syndrome   Peripheral edema   Hypokalemia   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 chronic kidney disease ([name])   Protein-calorie malnutrition, severe ([name])   Physical deconditioning   Neck abscess   CKD (chronic kidney disease) stage 4, GFR 15-29 ml/min ([name])   Assessment and Plan: * Sepsis ([place]) Ruled in for sepsis at admission Due to L neck abscess CT with 4x2.4x4.8 cm phlegmon (limited evaluation due to absence of contrast), enlarged L upper chain cervical chain LN S/p I&D 5/28 Concern for R breast infection as well, no definite or focal fluid collection noted on [place] MSSA on 5/28 culture Blood cultures ngtd Narrowed to ancef  Abscess of breast, right [place] without definite or focal fluid collection Needs outpatient follow up  Uncontrolled type 1 diabetes mellitus with hyperglycemia ([place]) Uncontrolled type I diabetic with Lindsey Murillo last hemoglobin A1c of 9.8 Basal insulin appears it was d/c'd due to hypoglycemia?  Hold cautiously for now (she was just on D5 IVF and still  wasn't requiring much insulin) start with q4 SSI and follow closely - doesn't look like she's eating much  AKI (acute kidney injury) ([place]) Creatinine 5.41 today - relatively stable from peak 5.45 yesterday Baseline ~3 Renal US today without hydro Appears overloaded Renal consult, appreciate assistance Diuresis with albumin given edema/nephrotic syndrome  Peripheral edema Diuresis with albumin for nephrotic syndrome  Hypokalemia Resolved,  follow  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 chronic kidney disease ([place]) Follow Elevated ferritin, low iron - normal folate, elevated b12  Protein-calorie malnutrition, severe ([name]) noted  Physical deconditioning PT      DVT prophylaxis: heparin Code Status: full Family Communication: mom and grandmom Disposition:   Status is: Inpatient Remains inpatient appropriate because: need for IV abx   Consultants:  Renal Surgery  Procedures:  I&D 5/28  Antimicrobials:  Anti-infectives (From admission, onward)    Start     Dose/Rate Route Frequency Ordered Stop   10/06/21 2200  ceFAZolin (ANCEF) IVPB 1 g/50 mL premix        1 g 100 mL/hr over 30 Minutes Intravenous Every 12 hours 10/06/21 1052     10/04/21 2200  ceFEPIme (MAXIPIME) 1 g in sodium chloride 0.9 % 100 mL IVPB  Status:  Discontinued        1 g 200 mL/hr over 30 Minutes Intravenous Every 24 hours 10/04/21 1011 10/05/21 1343   10/03/21 2200  vancomycin (VANCOREADY) IVPB 750 mg/150 mL  Status:  Discontinued        750 mg 150 mL/hr over 60 Minutes Intravenous Every 48 hours 10/02/21 0033 10/02/21 1144   10/03/21 1000  linezolid (ZYVOX) IVPB 600 mg  Status:  Discontinued        600 mg 300 mL/hr over 60 Minutes Intravenous Every 12 hours 10/02/21 1144 10/06/21 1052   10/02/21 2200  ceFEPIme (MAXIPIME) 2 g in sodium chloride 0.9 % 100 mL IVPB  Status:  Discontinued        2 g 200 mL/hr over 30 Minutes Intravenous Every 24 hours 10/02/21 0033 10/04/21 1011   10/02/21 0000  ceFEPIme (MAXIPIME) 2 g in sodium chloride 0.9 % 100 mL IVPB        2 g 200 mL/hr over 30 Minutes Intravenous  Once 10/01/21 2358 10/02/21 0246   10/02/21 0000  metroNIDAZOLE (FLAGYL) IVPB 500 mg        500 mg 100 mL/hr over 60 Minutes Intravenous  Once 10/01/21 2358 10/02/21 0246   10/02/21 0000  vancomycin (VANCOCIN) IVPB 1000 mg/200 mL premix        1,000 mg 200 mL/hr over 60 Minutes Intravenous  Once 10/01/21 2358 10/02/21 0244       Subjective: C/o pain  and SOB -> when clarified, she notes pressure around wound  Objective: Vitals:   10/06/21 2151 10/07/21 0409 10/07/21 0740 10/07/21 1549  BP: 90/68 101/69 (!) 91/59 108/75  Pulse: 86 86 87 79  Resp:  17 16 16   Temp: 98.1 F (36.7 C) 98.5 F (36.9 C) 98.1 F (36.7 C) (!) 97.5 F (36.4 C)  TempSrc: Oral  Oral Oral  SpO2: 100% 100% 99% 100%  Weight:      Height:        Intake/Output Summary (Last 24 hours) at 10/07/2021 1833 Last data filed at 10/07/2021 3875 Gross per 24 hour  Intake --  Output 500 ml  Net -500 ml   Filed Weights   10/02/21 0531 10/04/21 0703 10/05/21 0358  Weight: 63.5 kg 63.5 kg 70.6 kg  Examination:  General exam: Appears calm and comfortable  Open wound with exudate in wound base, surrounding induration Respiratory system: unlabored Cardiovascular system: RRR Gastrointestinal system: Abdomen is nondistended, soft and nontender.  Central nervous system: Alert and oriented. No focal neurological deficits. Extremities: bilateral LE edema, sacral edema     Data Reviewed: I have personally reviewed following labs and imaging studies  CBC: Recent Labs  Lab 10/03/21 0116 10/04/21 0051 10/05/21 0125 10/06/21 0148 10/07/21 0230  WBC 37.9* 35.8* 39.3* 35.9* 33.2*  NEUTROABS 35.6*  --   --   --   --   HGB 7.5* 7.1* 7.6* 7.2* 7.4*  HCT 22.8* 21.9* 24.0* 22.3* 22.9*  MCV 85.4 87.6 89.6 86.1 86.1  PLT 315 285 287 304 841    Basic Metabolic Panel: Recent Labs  Lab 10/03/21 0116 10/04/21 0051 10/05/21 0125 10/06/21 0148 10/07/21 0230  NA 127* 127* 127* 126* 121*  K 3.3* 5.0 5.0 4.8 4.5  CL 104 107 107 100 96*  CO2 13* 12* 11* 15* 16*  GLUCOSE 68* 116* 162* 122* 103*  BUN 64* 63* 63* 65* 63*  CREATININE 4.80* 4.90* 5.17* 5.45* 5.41*  CALCIUM 7.6* 7.4* 7.5* 7.8* 7.4*  MG 1.7  --   --   --   --   PHOS  --  5.2* 5.5* 5.1* 5.0*    GFR: Estimated Creatinine Clearance: 15.2 mL/min (Tyrie Porzio) (by C-G formula based on SCr of 5.41 mg/dL  (H)).  Liver Function Tests: Recent Labs  Lab 10/01/21 2336 10/03/21 0116 10/04/21 0051 10/05/21 0125 10/06/21 0148 10/07/21 0230 10/07/21 0937  AST 13* 11*  --   --   --   --   --   ALT 19 14  --   --   --   --   --   ALKPHOS 310* 242*  --   --   --   --   --   BILITOT 0.4 0.6  --   --   --   --   --   PROT 5.4* 5.4*  --   --   --   --   --   ALBUMIN 1.5* 1.7* <1.5* <1.5* <1.5* <1.5* <1.5*    CBG: Recent Labs  Lab 10/06/21 2031 10/07/21 0339 10/07/21 0850 10/07/21 1130 10/07/21 1623  GLUCAP 119* 110* 136* 122* 131*     Recent Results (from the past 240 hour(s))  Culture, blood (Routine x 2)     Status: None   Collection Time: 10/01/21 10:05 PM   Specimen: BLOOD  Result Value Ref Range Status   Specimen Description BLOOD RIGHT ANTECUBITAL  Final   Special Requests   Final    BOTTLES DRAWN AEROBIC AND ANAEROBIC Blood Culture adequate volume   Culture   Final    NO GROWTH 5 DAYS Performed at [facility], [address] [place], [place] [address]    Report Status 10/06/2021 FINAL  Final  Culture, blood (Routine x 2)     Status: None   Collection Time: 10/01/21 10:05 PM   Specimen: BLOOD  Result Value Ref Range Status   Specimen Description BLOOD BLOOD LEFT HAND  Final   Special Requests   Final    BOTTLES DRAWN AEROBIC AND ANAEROBIC Blood Culture adequate volume   Culture   Final    NO GROWTH 5 DAYS Performed at [facility], [address], [place], [place] [address]    Report Status 10/06/2021 FINAL  Final  Surgical pcr screen  Status: Abnormal   Collection Time: 10/03/21  1:00 PM   Specimen: Nasal Mucosa; Nasal Swab  Result Value Ref Range Status   MRSA, PCR NEGATIVE NEGATIVE Final   Staphylococcus aureus POSITIVE (Janvi Ammar) NEGATIVE Final    Comment: (NOTE) The Xpert SA Assay (FDA approved for NASAL specimens in patients 75 years of age and older), is one component of [place] comprehensive surveillance program. It is not intended to diagnose infection nor  to guide or monitor treatment. Performed at [facility], [place] [address], [place], [place] [address]   Aerobic/Anaerobic Culture w Gram Stain (surgical/deep wound)     Status: None (Preliminary result)   Collection Time: 10/04/21  8:44 AM   Specimen: Abscess  Result Value Ref Range Status   Specimen Description ABSCESS NECK  Final   Special Requests NONE  Final   Gram Stain   Final    FEW WBC PRESENT, PREDOMINANTLY PMN MODERATE GRAM POSITIVE COCCI IN PAIRS IN CLUSTERS Performed at [facility], [address], [place], [place] [address]    Culture   Final    MODERATE STAPHYLOCOCCUS AUREUS NO ANAEROBES ISOLATED; CULTURE IN PROGRESS FOR 5 DAYS    Report Status PENDING  Incomplete   Organism ID, Bacteria STAPHYLOCOCCUS AUREUS  Final      Susceptibility   Staphylococcus aureus - MIC*    CIPROFLOXACIN <=0.5 SENSITIVE Sensitive     ERYTHROMYCIN <=0.25 SENSITIVE Sensitive     GENTAMICIN <=0.5 SENSITIVE Sensitive     OXACILLIN 0.5 SENSITIVE Sensitive     TETRACYCLINE <=1 SENSITIVE Sensitive     VANCOMYCIN 1 SENSITIVE Sensitive     TRIMETH/SULFA <=10 SENSITIVE Sensitive     CLINDAMYCIN <=0.25 SENSITIVE Sensitive     RIFAMPIN <=0.5 SENSITIVE Sensitive     Inducible Clindamycin NEGATIVE Sensitive     * MODERATE STAPHYLOCOCCUS AUREUS         Radiology Studies: US RENAL  Result Date: 10/07/2021 CLINICAL DATA:  AKI EXAM: RENAL / URINARY TRACT ULTRASOUND COMPLETE COMPARISON:  CT 06/07/2019 FINDINGS: Right Kidney: Renal measurements: 10.5 x 4.8 x 6.1 cm = volume: 159.90 mL. Increased renal cortical echogenicity. No hydronephrosis. Left Kidney: Renal measurements: 10.9 x 5.5 x 4.3 cm = volume: There 34.20 mL. Increased renal cortical echogenicity. No hydronephrosis. Bladder: Not visualized. Other: Right pleural effusion. IMPRESSION: Increased renal cortical echogenicity bilaterally as can be seen in medical renal disease. No hydronephrosis. Right pleural effusion.  Electronically Signed   By: Maurine Simmering M.D.   On: 10/07/2021 15:44        Scheduled Meds:  acetaminophen  650 mg Oral Q6H   calcium acetate  667 mg Oral TID WC   Chlorhexidine Gluconate Cloth  6 each Topical Daily   darbepoetin (ARANESP) injection - NON-DIALYSIS  60 mcg Subcutaneous Q Wed-1800   feeding supplement (NEPRO CARB STEADY)  237 mL Oral BID BM   furosemide  60 mg Intravenous BID   heparin  5,000 Units Subcutaneous Q8H   insulin aspart  0-6 Units Subcutaneous Q4H   mupirocin ointment  1 application. Nasal BID   rosuvastatin  10 mg Oral Daily   sodium bicarbonate  1,300 mg Oral TID   Continuous Infusions:  albumin human 25 g (10/07/21 1340)    ceFAZolin (ANCEF) IV 1 g (10/07/21 1015)     LOS: 5 days    Time spent: over 30 min    Fayrene Helper, MD [facility]   To contact the attending provider between 7A-7P or  the covering provider during after hours 7P-7A, please log into the web site [email or web address] and access using universal [place] password for that web site. If you do not have the password, please call the hospital operator.  10/07/2021, 6:33 PM

## 2021-10-07 NOTE — Consult Note (Addendum)
Nephrology Consult   Assessment/Recommendations:   AKI on CKD 4:  -baseline Cr ~3 (follows with Dr. Moshe Cipro).  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 -will check UA w/ microscopy, renal ultrasound -Fortunately, her Cr seems to have plateaued today but bigger picture is that I am not sure how much renal reserve she has or how much meaningful recovery she'll have. I am hoping that her kidney function will start to improve. If she is unable to be optimized from a volume standpoint or if she develops any uremic symptoms, then will prep her for HD. Discussed this at length with patient and mother at the bedside and they are accepting of this -Maintain MAP>65 for optimal renal perfusion.  -Avoid nephrotoxic medications including NSAIDs and iodinated intravenous contrast exposure unless the latter is absolutely indicated.  Preferred narcotic agents for pain control are hydromorphone, fentanyl, and methadone. Morphine should not be used. Avoid Baclofen and avoid oral sodium phosphate and magnesium citrate based laxatives / bowel preps. Continue strict Input and Output monitoring. Will monitor the patient closely with you and intervene or adjust therapy as indicated by changes in clinical status/labs   Severe sepsis secondary to posterior neck abscess -s/p I&D 5/28--MSSA on operative culture -abx per primary service, currently on cefazolin  Hyponatremia (acute on chronic) -hypervolemic on exam, AKI also likely a culprit -agree with lasix 60mg  IV BID along with albumin -urine lytes pending. Sosm 276 -fluid restrict 1.5L/day  Edema/nephrotic syndrome -agree with lasix 60mg  IV BID w/ albumin support today, if no adequate diuresis then would recommend increasing to 120mg  BID (w/ continued albumin support). -strict I/O  H/O  Hypertension: -now with softer BP's, home anti-HTNs on hold  Metabolic acidosis -chronic issue. likely related to AKI, s/p bicarb gtt. Will start nahco3 1300mg  TID  Anemia due to chronic disease: -Transfuse for Hgb<7 g/dL -has adequate iron stores, will start ESA  Secondary hyperparathyroidism/CKD-MBD -c/w phoslo  Uncontrolled Diabetes Mellitus Type 1 with Hyperglycemia -mgmt per primary service  Severe protein calorie malnutrition -with underlying chronic nephrotic syndrome -push protein, agree with albumin  Recommendations conveyed to primary service.   [facility] 10/07/2021 11:46 AM  _____________________________________________________________________________________   History of Present Illness: Lindsey Murillo is a/an 26 y.o. female with a past medical history of CKD 4 with nephrotic syndrome (follows with Dr. Moshe Cipro), DM 1, hypertension, chronic anemia, diabetic neuropathy who initially presented with generally not feeling well, edema, posterior neck pain with drainage.  Found to have severe sepsis secondary to posterior neck abscess status post I&D on 5/28.  Originally on linezolid, switch to cefazolin (operative culture was MSSA).  Also found to have a right breast abscess which is being medically managed.  Has had issues with urinary retention requiring in and out catheterization. Her creatinine on presentation was 3.17, has been uptrending up to 5.45 on 5/30, plateaued on 5/31 (5.41).  She has also had hyponatremia which has been worsening, down to 121 on 10/07/2021. Patient seen and examined today, mother at the bedside. Patient has been restless and uncomfortable, is more uncomfortable staying in a recliner. She is awake and not very interactive with me. When prompted, she does say no to n/v/dysgeusia, loss of appetite, new shakes/tremors.  Medications:  Current Facility-Administered Medications  Medication Dose Route Frequency  Provider Last Rate Last Admin   acetaminophen (TYLENOL) tablet 650 mg  650 mg  Oral Q6H Norm Parcel, PA-C   650 mg at 10/07/21 1016   albumin human 25 % solution 25 g  25 g Intravenous BID Elodia Florence., MD       calcium acetate (PHOSLO) capsule 667 mg  667 mg Oral TID WC Zierle-Ghosh, Asia B, DO   667 mg at 10/07/21 1014   ceFAZolin (ANCEF) IVPB 1 g/50 mL premix  1 g Intravenous Q12H [place] Donnamarie Poag, DO 100 mL/hr at 10/07/21 1015 1 g at 10/07/21 1015   Chlorhexidine Gluconate Cloth 2 % PADS 6 each  6 each Topical Daily [place] Eric J, DO   6 each at 10/07/21 1032   feeding supplement (NEPRO CARB STEADY) liquid 237 mL  237 mL Oral BID BM [place] Eric J, DO       furosemide (LASIX) injection 60 mg  60 mg Intravenous BID Elodia Florence., MD       heparin injection 5,000 Units  5,000 Units Subcutaneous Q8H Zierle-Ghosh, Asia B, DO   5,000 Units at 10/07/21 0558   HYDROmorphone (DILAUDID) injection 1 mg  1 mg Intravenous Q2H PRN Ralene Ok, MD   1 mg at 10/06/21 2237   insulin aspart (novoLOG) injection 0-5 Units  0-5 Units Subcutaneous QHS Zierle-Ghosh, Asia B, DO       insulin aspart (novoLOG) injection 0-9 Units  0-9 Units Subcutaneous TID WC Zierle-Ghosh, Asia B, DO   1 Units at 10/07/21 1015   mupirocin ointment (BACTROBAN) 2 % 1 application.  1 application. Nasal BID [place] Eric J, DO   1 application. at 10/07/21 1032   ondansetron (ZOFRAN) tablet 4 mg  4 mg Oral Q6H PRN Zierle-Ghosh, Asia B, DO   4 mg at 10/06/21 1058   Or   ondansetron (ZOFRAN) injection 4 mg  4 mg Intravenous Q6H PRN Zierle-Ghosh, Asia B, DO   4 mg at 10/05/21 0701   oxyCODONE (Oxy IR/ROXICODONE) immediate release tablet 5-10 mg  5-10 mg Oral Q4H PRN Barkley Boards R, PA-C       polyethylene glycol (MIRALAX / GLYCOLAX) packet 17 g  17 g Oral Daily PRN [place] Eric J, DO   17 g at 10/07/21 0554   rosuvastatin (CRESTOR) tablet 10 mg  10 mg Oral Daily Zierle-Ghosh, Asia B, DO   10 mg at 10/07/21  1015     ALLERGIES Patient has no known allergies.  MEDICAL HISTORY Past Medical History:  Diagnosis Date   Acanthosis nigricans, acquired    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 chronic kidney disease ([place]) 09/25/2021   Asthma    Diabetic autonomic neuropathy ([name])    Diabetic peripheral neuropathy ([name])    Environmental allergies    Goiter    Hypoglycemia associated with diabetes ([place])    Tachycardia    Thyroiditis, autoimmune    Type 1 diabetes mellitus in patient age 11-19 years with HbA1C goal below 7.5      SOCIAL HISTORY Social History   Socioeconomic History   Marital status: Single    Spouse name: Not on file   Number of children: 0   Years of education: Not on file   Highest education level: High school graduate  Occupational History   Occupation: unemployed  Tobacco Use   Smoking status: Never   Smokeless tobacco: Never  Vaping Use   Vaping Use: Never used  Substance and Sexual Activity   Alcohol use: No   Drug use: No   Sexual activity: Never    Birth control/protection:  Abstinence  Other Topics Concern   Not on file  Social History Narrative   Not on file   Social Determinants of Health   Financial Resource Strain: Not on file  Food Insecurity: Not on file  Transportation Needs: Not on file  Physical Activity: Not on file  Stress: Not on file  Social Connections: Not on file  Intimate Partner Violence: Not on file     FAMILY HISTORY Family History  Problem Relation Age of Onset   Diabetes Mother        Type II DM   Thyroid disease Mother    Diabetes Maternal Grandmother        Type II DM   Diabetes Cousin        Type II DM   Colon cancer Neg Hx    Colon polyps Neg Hx      Review of Systems: 12 systems reviewed Otherwise as per HPI, all other systems reviewed and negative  Physical Exam: Vitals:   10/07/21 0409 10/07/21 0740  BP: 101/69 (!) 91/59  Pulse: 86 87  Resp: 17 16  Temp: 98.5 F (36.9 C) 98.1 F (36.7 C)  SpO2: 100% 99%   No  intake/output data recorded.  Intake/Output Summary (Last 24 hours) at 10/07/2021 1146 Last data filed at 10/07/2021 1610 Gross per 24 hour  Intake --  Output 500 ml  Net -500 ml   General: chronically ill appearing, no acute distress, sitting in chair HEENT: anicteric sclera, oropharynx clear without lesions CV: regular rate, normal rhythm, no murmurs, no gallops, no rubs Lungs: clear to auscultation bilaterally, normal work of breathing Abd: soft, non-tender, non-distended Skin: posterior/left lateral neck packing in place Ext: 2-3+ edema b/l LEs Neuro: awake/alert, flat affect, not engaging, normal speech, no gross focal deficits, no asterixis  Test Results Reviewed Lab Results  Component Value Date   NA 121 (L) 10/07/2021   K 4.5 10/07/2021   CL 96 (L) 10/07/2021   CO2 16 (L) 10/07/2021   BUN 63 (H) 10/07/2021   CREATININE 5.41 (H) 10/07/2021   CALCIUM 7.4 (L) 10/07/2021   ALBUMIN <1.5 (L) 10/07/2021   PHOS 5.0 (H) 10/07/2021     I have reviewed all relevant outside healthcare records related to the patient's kidney injury.

## 2021-10-08 DIAGNOSIS — R8281 Pyuria: Secondary | ICD-10-CM

## 2021-10-08 DIAGNOSIS — N179 Acute kidney failure, unspecified: Secondary | ICD-10-CM | POA: Diagnosis not present

## 2021-10-08 DIAGNOSIS — A419 Sepsis, unspecified organism: Secondary | ICD-10-CM | POA: Diagnosis not present

## 2021-10-08 DIAGNOSIS — S025XXA Fracture of tooth (traumatic), initial encounter for closed fracture: Secondary | ICD-10-CM

## 2021-10-08 DIAGNOSIS — R6520 Severe sepsis without septic shock: Secondary | ICD-10-CM | POA: Diagnosis not present

## 2021-10-08 LAB — CBC WITH DIFFERENTIAL/PLATELET
Abs Immature Granulocytes: 0.31 10*3/uL — ABNORMAL HIGH (ref 0.00–0.07)
Basophils Absolute: 0.1 10*3/uL (ref 0.0–0.1)
Basophils Relative: 0 %
Eosinophils Absolute: 0.3 10*3/uL (ref 0.0–0.5)
Eosinophils Relative: 1 %
HCT: 21.9 % — ABNORMAL LOW (ref 36.0–46.0)
Hemoglobin: 7.1 g/dL — ABNORMAL LOW (ref 12.0–15.0)
Immature Granulocytes: 1 %
Lymphocytes Relative: 4 %
Lymphs Abs: 1 10*3/uL (ref 0.7–4.0)
MCH: 28.1 pg (ref 26.0–34.0)
MCHC: 32.4 g/dL (ref 30.0–36.0)
MCV: 86.6 fL (ref 80.0–100.0)
Monocytes Absolute: 0.6 10*3/uL (ref 0.1–1.0)
Monocytes Relative: 2 %
Neutro Abs: 24.6 10*3/uL — ABNORMAL HIGH (ref 1.7–7.7)
Neutrophils Relative %: 92 %
Platelets: 238 10*3/uL (ref 150–400)
RBC: 2.53 MIL/uL — ABNORMAL LOW (ref 3.87–5.11)
RDW: 16.6 % — ABNORMAL HIGH (ref 11.5–15.5)
WBC: 26.9 10*3/uL — ABNORMAL HIGH (ref 4.0–10.5)
nRBC: 0 % (ref 0.0–0.2)

## 2021-10-08 LAB — COMPREHENSIVE METABOLIC PANEL
ALT: 11 U/L (ref 0–44)
AST: 10 U/L — ABNORMAL LOW (ref 15–41)
Albumin: 1.7 g/dL — ABNORMAL LOW (ref 3.5–5.0)
Alkaline Phosphatase: 190 U/L — ABNORMAL HIGH (ref 38–126)
Anion gap: 12 (ref 5–15)
BUN: 63 mg/dL — ABNORMAL HIGH (ref 6–20)
CO2: 16 mmol/L — ABNORMAL LOW (ref 22–32)
Calcium: 7.5 mg/dL — ABNORMAL LOW (ref 8.9–10.3)
Chloride: 94 mmol/L — ABNORMAL LOW (ref 98–111)
Creatinine, Ser: 5.54 mg/dL — ABNORMAL HIGH (ref 0.44–1.00)
GFR, Estimated: 10 mL/min — ABNORMAL LOW (ref >60)
Glucose, Bld: 97 mg/dL (ref 70–99)
Potassium: 4.3 mmol/L (ref 3.5–5.1)
Sodium: 122 mmol/L — ABNORMAL LOW (ref 135–145)
Total Bilirubin: 0.3 mg/dL (ref 0.3–1.2)
Total Protein: 4.7 g/dL — ABNORMAL LOW (ref 6.5–8.1)

## 2021-10-08 LAB — BASIC METABOLIC PANEL
Anion gap: 11 (ref 5–15)
BUN: 66 mg/dL — ABNORMAL HIGH (ref 6–20)
CO2: 17 mmol/L — ABNORMAL LOW (ref 22–32)
Calcium: 7.4 mg/dL — ABNORMAL LOW (ref 8.9–10.3)
Chloride: 95 mmol/L — ABNORMAL LOW (ref 98–111)
Creatinine, Ser: 5.83 mg/dL — ABNORMAL HIGH (ref 0.44–1.00)
GFR, Estimated: 10 mL/min — ABNORMAL LOW (ref >60)
Glucose, Bld: 93 mg/dL (ref 70–99)
Potassium: 4.3 mmol/L (ref 3.5–5.1)
Sodium: 123 mmol/L — ABNORMAL LOW (ref 135–145)

## 2021-10-08 LAB — GLUCOSE, CAPILLARY
Glucose-Capillary: 111 mg/dL — ABNORMAL HIGH (ref 70–99)
Glucose-Capillary: 104 mg/dL — ABNORMAL HIGH (ref 70–99)
Glucose-Capillary: 103 mg/dL — ABNORMAL HIGH (ref 70–99)
Glucose-Capillary: 96 mg/dL (ref 70–99)
Glucose-Capillary: 97 mg/dL (ref 70–99)
Glucose-Capillary: 95 mg/dL (ref 70–99)

## 2021-10-08 LAB — MAGNESIUM: Magnesium: 1.5 mg/dL — ABNORMAL LOW (ref 1.7–2.4)

## 2021-10-08 LAB — PHOSPHORUS: Phosphorus: 5.1 mg/dL — ABNORMAL HIGH (ref 2.5–4.6)

## 2021-10-08 MED ORDER — MAGNESIUM OXIDE -MG SUPPLEMENT 400 (240 MG) MG PO TABS
400.0000 mg | ORAL_TABLET | Freq: Every day | ORAL | Status: DC
  Administered 2021-10-08 – 2021-10-20 (×12): 400 mg via ORAL
  Filled 2021-10-08 (×13): qty 1

## 2021-10-08 MED ORDER — FUROSEMIDE 10 MG/ML IJ SOLN
120.0000 mg | Freq: Two times a day (BID) | INTRAVENOUS | Status: DC
  Administered 2021-10-08 – 2021-10-14 (×12): 120 mg via INTRAVENOUS
  Filled 2021-10-08 (×2): qty 12
  Filled 2021-10-08 (×4): qty 10
  Filled 2021-10-08: qty 12
  Filled 2021-10-08: qty 10
  Filled 2021-10-08: qty 2
  Filled 2021-10-08 (×3): qty 10
  Filled 2021-10-08: qty 12
  Filled 2021-10-08: qty 2

## 2021-10-08 NOTE — Progress Notes (Signed)
Inpatient Rehabilitation Admissions Coordinator   Noted nephrology consultation and plan of care. I will hold on beginning Auth for possible [facility] admit until she has progressed further medically.  Danne Baxter, RN, MSN Rehab Admissions Coordinator [phone] 10/08/2021 11:05 AM

## 2021-10-08 NOTE — Assessment & Plan Note (Addendum)
Repleted. °

## 2021-10-08 NOTE — Assessment & Plan Note (Addendum)
Upper right Pain related to this Continue abx for now  Needs dental f/u outpatient

## 2021-10-08 NOTE — Progress Notes (Signed)
[facility] Progress Note    Assessment/ Plan:   AKI on CKD 4:  -baseline Cr ~3 (follows with Dr. Moshe Cipro).  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 -UA w/ bacteria but with yeast as well. Will defer to primary service for treatment -no obstruction on u/s -will check UA w/ microscopy, renal ultrasound -Cr relatively stable, but she is getting close to needing to start dialysis (volume up + dysgeusia), will attempt to offload volume with increased diuretic dosing. Not sure how much renal reserve she has at this point given severe underlying scar tissue on her renal biopsy -Re-discussed dialysis with patient (had discussed this with mother yesterday). Will give her time to think about this since this is all new to her. If kidney function is not improving in the next 24-48 hrs then will start the process while she's here -Maintain MAP>65 for optimal renal perfusion.  -Avoid nephrotoxic medications including NSAIDs and iodinated intravenous contrast exposure unless the latter is absolutely indicated.  Preferred narcotic agents for pain control are hydromorphone, fentanyl, and methadone. Morphine should not be used. Avoid Baclofen and avoid oral sodium phosphate and magnesium citrate based laxatives / bowel preps. Continue strict Input and Output monitoring. Will monitor the patient closely with you and intervene or adjust therapy as indicated by changes in clinical status/labs    Severe sepsis secondary to posterior neck abscess -s/p I&D 5/28--MSSA on operative culture -abx per primary service, currently on cefazolin   Hyponatremia (acute on chronic) -hypervolemic on exam, AKI also likely a culprit -agree with lasix+albumin,increasing lasix dose today. Na stable/touch better, up to 122 from 121 -fluid restrict 1.5L/day    Edema/nephrotic syndrome -agree with lasix w/ albumin support, recommend increasing to 120mg  BID (w/ continued albumin support). -strict I/O   H/O Hypertension: -with softer BP's initially which seems to be better today. home anti-HTNs on hold   Metabolic acidosis, chronic -chronic issue. likely related to AKI, s/p bicarb gtt. Will start nahco3 1300mg  TID. Bicarb stable   Anemia due to chronic disease: -Transfuse for Hgb<7 g/dL -has adequate iron stores, will start ESA   Secondary hyperparathyroidism/CKD-MBD -c/w phoslo   Uncontrolled Diabetes Mellitus Type 1 with Hyperglycemia -mgmt per primary service   Severe protein calorie malnutrition -with underlying chronic nephrotic syndrome -push protein, agree with albumin   Recommendations conveyed to primary service.   Subjective:   No acute events. No urine output charted (has a foley) Swelling relatively unchanged. She does report dysgeusia which is new. No other complaints.   Objective:   BP 122/84 (BP Location: Right Arm)   Pulse 85   Temp 97.7 F (36.5 C) (Oral)   Resp 17   Ht 5' 7.01" (1.702 m)   Wt 74.1 kg   SpO2 100%   BMI 25.58 kg/m  No intake or output data in the 24 hours ending 10/08/21 1200 Weight change:   Physical Exam: MVH:[id number] ill appearing, appear uncomfortable CVS:RRR Resp:diminished air entry bibasilar, normal WOB XLK:GMWN Ext:++pitting edema b/l LE's Neuro: awake, alert, speech clear and coherent, no asterixis  Imaging: US RENAL  Result Date: 10/07/2021 CLINICAL DATA:  AKI EXAM: RENAL / URINARY TRACT ULTRASOUND COMPLETE COMPARISON:  CT 06/07/2019 FINDINGS: Right Kidney: Renal measurements: 10.5 x 4.8 x 6.1 cm = volume: 159.90 mL. Increased renal cortical echogenicity. No hydronephrosis. Left Kidney: Renal measurements: 10.9 x 5.5 x 4.3 cm = volume: There 34.20  mL. Increased renal cortical echogenicity. No hydronephrosis. Bladder: Not visualized. Other: Right pleural effusion.  IMPRESSION: Increased renal cortical echogenicity bilaterally as can be seen in medical renal disease. No hydronephrosis. Right pleural effusion. Electronically Signed   By: Maurine Simmering M.D.   On: 10/07/2021 15:44    Labs: BMET Recent Labs  Lab 10/01/21 2053 10/03/21 0116 10/04/21 0051 10/05/21 0125 10/06/21 0148 10/07/21 0230 10/08/21 0158  NA 130* 127* 127* 127* 126* 121* 122*  K 2.7* 3.3* 5.0 5.0 4.8 4.5 4.3  CL 107 104 107 107 100 96* 94*  CO2 14* 13* 12* 11* 15* 16* 16*  GLUCOSE 179* 68* 116* 162* 122* 103* 97  BUN 66* 64* 63* 63* 65* 63* 63*  CREATININE 4.76* 4.80* 4.90* 5.17* 5.45* 5.41* 5.54*  CALCIUM 7.7* 7.6* 7.4* 7.5* 7.8* 7.4* 7.5*  PHOS  --   --  5.2* 5.5* 5.1* 5.0* 5.1*   CBC Recent Labs  Lab 10/03/21 0116 10/04/21 0051 10/05/21 0125 10/06/21 0148 10/07/21 0230 10/08/21 0158  WBC 37.9*   < > 39.3* 35.9* 33.2* 26.9*  NEUTROABS 35.6*  --   --   --   --  24.6*  HGB 7.5*   < > 7.6* 7.2* 7.4* 7.1*  HCT 22.8*   < > 24.0* 22.3* 22.9* 21.9*  MCV 85.4   < > 89.6 86.1 86.1 86.6  PLT 315   < > 287 304 286 238   < > = values in this interval not displayed.    Medications:     acetaminophen  650 mg Oral Q6H   calcium acetate  667 mg Oral TID WC   Chlorhexidine Gluconate Cloth  6 each Topical Daily   darbepoetin (ARANESP) injection - NON-DIALYSIS  60 mcg Subcutaneous Q Wed-1800   feeding supplement (NEPRO CARB STEADY)  237 mL Oral BID BM   heparin  5,000 Units Subcutaneous Q8H   insulin aspart  0-6 Units Subcutaneous Q4H   magnesium oxide  400 mg Oral Daily   mupirocin ointment  1 application. Nasal BID   rosuvastatin  10 mg Oral Daily   sodium bicarbonate  1,300 mg Oral TID      Gean Quint, MD [facility] 10/08/2021, 12:00 PM

## 2021-10-08 NOTE — Assessment & Plan Note (Addendum)
UA with bacteria and yeast - no clear symptoms, suspect contaminant. -Patient remained asymptomatic. -Patient was empirically on antibiotics due to right breast cellulitis as well as neck abscess/cellulitis.

## 2021-10-08 NOTE — Progress Notes (Signed)
PROGRESS NOTE    Lindsey Murillo  XFG:[id number] DOB: [birth year] DOA: 10/01/2021 PCP: Lindsey Spikes, DO  Chief Complaint  Patient presents with   Leg Swelling    X 2 weeks     Brief Narrative:  Lindsey Murillo is Lindsey Murillo 26 y.o. female with past medical history significant for is Lindsey Murillo 26 year old female with past medical history significant for type 1 diabetes mellitus, stage IV CKD with nephrotic syndrome, anemia of chronic medical/renal disease, diabetic neuropathy, HTN who initially presented to [facility] ED on 5/25 with complaints of not feeling well, lower extremity edema, and posterior neck pain with associated drainage.  Mother at bedside assists with HPI given patient's resistance/evasive to questions.  Her mother reports that patient has not been feeling well for the last 2 weeks and noted Lindsey Murillo area of the posterior left neck that started draining 2 days prior.  Patient/mother denied any trauma to that region.  Pain is worse with any type of range of motion of the neck, nothing seems to make it better.  Mother put some ointment on it which offered her no relief.   In the ED, temperature 98.2 F, HR 102, RR 17, BP 96/67, SPO2 100% on room air.  Sodium 130, potassium 2.7, chloride 107, CO2 14, glucose 179, BUN 66, creatinine 4.76.  BNP 626.0.  WBC 38.9, hemoglobin 8.6, platelet count 330.  Lactic acid 0.7, procalcitonin 1.85.  INR 1.2.  Urinalysis with trace leukocytes, negative nitrite, rare bacteria, 11-20 WBCs.  Pregnancy test negative.  Chest x-ray with cardiomegaly, no active cardiopulmonary disease process.  CT soft tissue neck with area of phlegmon/abscess measuring 4 x 2.4 x 4.8 cm localized upper left neck with enlarged left upper cervical chain lymph nodes.  Blood cultures x2 were drawn.  Patient was started on vancomycin, cefepime, Flagyl.  Initially admitted to [facility].  Valuated by general surgery, Dr. Arnoldo Murillo and requested transfer to Lindsey Murillo higher level of care due to multiple  comorbidities.  Case was discussed with ENT, Dr. Benjamine Murillo; and location was out of scope of ENT and recommended transfer to [facility] for general surgery evaluation.  Patient was transferred to [facility] and admitted to the [facility] service with general surgery in consultation.       Assessment & Plan:   Principal Problem:   Sepsis ([place]) Active Problems:   Abscess of breast, right   Uncontrolled type 1 diabetes mellitus with hyperglycemia ([name])   Pyuria   AKI (acute kidney injury) ([place])   Nephrotic syndrome   Hyponatremia   Peripheral edema   Hypokalemia   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 chronic kidney disease ([name])   Protein-calorie malnutrition, severe ([name])   Physical deconditioning   Hypomagnesemia   Broken tooth   Neck abscess   CKD (chronic kidney disease) stage 4, GFR 15-29 ml/min ([name])   Assessment and Plan: * Sepsis ([place]) Ruled in for sepsis at admission Due to L neck abscess CT with 4x2.4x4.8 cm phlegmon (limited evaluation due to absence of contrast), enlarged L upper chain cervical chain LN S/p I&D 5/28 Concern for R breast infection as well, no definite or focal fluid collection noted on [place] MSSA on 5/28 culture Blood cultures ngtd Narrowed to ancef, continue for now White count finally starting to trend down  Abscess of breast, right [place] without definite or focal fluid collection Needs outpatient follow up  Pyuria UA with bacteria and yeast - no clear symptoms, suspect contaminant Follow culture  Uncontrolled type 1  diabetes mellitus with hyperglycemia ([place]) Uncontrolled type I diabetic with Lindsey Murillo last hemoglobin A1c of 9.8 Basal insulin appears it was d/c'd due to hypoglycemia?  Hold cautiously for now (she was just on D5 IVF and still wasn't requiring much insulin) start with q4 SSI and follow closely - doesn't look like she's eating much  Hyponatremia Overloaded Follow with diuresis  AKI (acute kidney injury) ([place]) Creatinine 5.45 today - relatively stable   Baseline ~3 Renal US today without hydro Appears overloaded Renal consult, appreciate assistance Diuresis with albumin given edema/nephrotic syndrome  Peripheral edema Diuresis with albumin for nephrotic syndrome  Hypokalemia Resolved, follow  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 chronic kidney disease ([place]) Follow Elevated ferritin, low iron - normal folate, elevated b12  Protein-calorie malnutrition, severe ([name]) noted  Physical deconditioning PT  Hypomagnesemia Replace and follow  Broken tooth Pain related to this Continue abx for now       DVT prophylaxis: heparin Code Status: full Family Communication: mom and grandmom Disposition:   Status is: Inpatient Remains inpatient appropriate because: need for IV abx   Consultants:  Renal Surgery  Procedures:  I&D 5/28  Antimicrobials:  Anti-infectives (From admission, onward)    Start     Dose/Rate Route Frequency Ordered Stop   10/06/21 2200  ceFAZolin (ANCEF) IVPB 1 g/50 mL premix        1 g 100 mL/hr over 30 Minutes Intravenous Every 12 hours 10/06/21 1052     10/04/21 2200  ceFEPIme (MAXIPIME) 1 g in sodium chloride 0.9 % 100 mL IVPB  Status:  Discontinued        1 g 200 mL/hr over 30 Minutes Intravenous Every 24 hours 10/04/21 1011 10/05/21 1343   10/03/21 2200  vancomycin (VANCOREADY) IVPB 750 mg/150 mL  Status:  Discontinued        750 mg 150 mL/hr over 60 Minutes Intravenous Every 48 hours 10/02/21 0033 10/02/21 1144   10/03/21 1000  linezolid (ZYVOX) IVPB 600 mg  Status:  Discontinued        600 mg 300 mL/hr over 60 Minutes Intravenous Every 12 hours 10/02/21 1144 10/06/21 1052   10/02/21 2200  ceFEPIme (MAXIPIME) 2 g in sodium chloride 0.9 % 100 mL IVPB  Status:  Discontinued        2 g 200 mL/hr over 30 Minutes Intravenous Every 24 hours 10/02/21 0033 10/04/21 1011   10/02/21 0000  ceFEPIme (MAXIPIME) 2 g in sodium chloride 0.9 % 100 mL IVPB        2 g 200 mL/hr over 30 Minutes Intravenous  Once 10/01/21  2358 10/02/21 0246   10/02/21 0000  metroNIDAZOLE (FLAGYL) IVPB 500 mg        500 mg 100 mL/hr over 60 Minutes Intravenous  Once 10/01/21 2358 10/02/21 0246   10/02/21 0000  vancomycin (VANCOCIN) IVPB 1000 mg/200 mL premix        1,000 mg 200 mL/hr over 60 Minutes Intravenous  Once 10/01/21 2358 10/02/21 0244       Subjective: C/o R jaw pain  Objective: Vitals:   10/07/21 2046 10/08/21 0343 10/08/21 0500 10/08/21 0808  BP: 102/73 129/83  122/84  Pulse: 81 86  85  Resp: 17 16  17   Temp: (!) 97.4 F (36.3 C) (!) 97.5 F (36.4 C)  97.7 F (36.5 C)  TempSrc: Axillary Oral  Oral  SpO2: 100% 100%  100%  Weight:   74.1 kg   Height:       No intake or  output data in the 24 hours ending 10/08/21 1327  Filed Weights   10/04/21 0703 10/05/21 0358 10/08/21 0500  Weight: 63.5 kg 70.6 kg 74.1 kg    Examination:  General: No acute distress. Broken tooth, upper R Cardiovascular:RRR Lungs: unlabored Abdomen: Soft, nontender, nondistended  Neurological: Alert and oriented 3. Moves all extremities 4 . Cranial nerves II through XII grossly intact. Skin: L neck wound appears similar to yesterday, surrounding induration - no fluctuance/crepitus  Extremities: bilateral LE edema   Data Reviewed: I have personally reviewed following labs and imaging studies  CBC: Recent Labs  Lab 10/03/21 0116 10/04/21 0051 10/05/21 0125 10/06/21 0148 10/07/21 0230 10/08/21 0158  WBC 37.9* 35.8* 39.3* 35.9* 33.2* 26.9*  NEUTROABS 35.6*  --   --   --   --  24.6*  HGB 7.5* 7.1* 7.6* 7.2* 7.4* 7.1*  HCT 22.8* 21.9* 24.0* 22.3* 22.9* 21.9*  MCV 85.4 87.6 89.6 86.1 86.1 86.6  PLT 315 285 287 304 286 220    Basic Metabolic Panel: Recent Labs  Lab 10/03/21 0116 10/04/21 0051 10/05/21 0125 10/06/21 0148 10/07/21 0230 10/08/21 0158  NA 127* 127* 127* 126* 121* 122*  K 3.3* 5.0 5.0 4.8 4.5 4.3  CL 104 107 107 100 96* 94*  CO2 13* 12* 11* 15* 16* 16*  GLUCOSE 68* 116* 162* 122* 103* 97   BUN 64* 63* 63* 65* 63* 63*  CREATININE 4.80* 4.90* 5.17* 5.45* 5.41* 5.54*  CALCIUM 7.6* 7.4* 7.5* 7.8* 7.4* 7.5*  MG 1.7  --   --   --   --  1.5*  PHOS  --  5.2* 5.5* 5.1* 5.0* 5.1*    GFR: Estimated Creatinine Clearance: 16 mL/min ([facility]) (by C-G formula based on SCr of 5.54 mg/dL (H)).  Liver Function Tests: Recent Labs  Lab 10/01/21 2336 10/03/21 0116 10/04/21 0051 10/05/21 0125 10/06/21 0148 10/07/21 0230 10/07/21 0937 10/08/21 0158  AST 13* 11*  --   --   --   --   --  10*  ALT 19 14  --   --   --   --   --  11  ALKPHOS 310* 242*  --   --   --   --   --  190*  BILITOT 0.4 0.6  --   --   --   --   --  0.3  PROT 5.4* 5.4*  --   --   --   --   --  4.7*  ALBUMIN 1.5* 1.7*   < > <1.5* <1.5* <1.5* <1.5* 1.7*   < > = values in this interval not displayed.    CBG: Recent Labs  Lab 10/07/21 2205 10/08/21 0100 10/08/21 0341 10/08/21 0810 10/08/21 1154  GLUCAP 116* 111* 104* 103* 96     Recent Results (from the past 240 hour(s))  Culture, blood (Routine x 2)     Status: None   Collection Time: 10/01/21 10:05 PM   Specimen: BLOOD  Result Value Ref Range Status   Specimen Description BLOOD RIGHT ANTECUBITAL  Final   Special Requests   Final    BOTTLES DRAWN AEROBIC AND ANAEROBIC Blood Culture adequate volume   Culture   Final    NO GROWTH 5 DAYS Performed at [facility], [address], [place], [place] [address]    Report Status 10/06/2021 FINAL  Final  Culture, blood (Routine x 2)     Status: None   Collection Time: 10/01/21 10:05 PM   Specimen:  BLOOD  Result Value Ref Range Status   Specimen Description BLOOD BLOOD LEFT HAND  Final   Special Requests   Final    BOTTLES DRAWN AEROBIC AND ANAEROBIC Blood Culture adequate volume   Culture   Final    NO GROWTH 5 DAYS Performed at [facility], [address], [place], [place] [address]    Report Status 10/06/2021 FINAL  Final  Surgical pcr screen     Status: Abnormal   Collection Time: 10/03/21  1:00 PM    Specimen: Nasal Mucosa; Nasal Swab  Result Value Ref Range Status   MRSA, PCR NEGATIVE NEGATIVE Final   Staphylococcus aureus POSITIVE ([place]) NEGATIVE Final    Comment: (NOTE) The Xpert SA Assay (FDA approved for NASAL specimens in patients 54 years of age and older), is one component of Nija Koopman comprehensive surveillance program. It is not intended to diagnose infection nor to guide or monitor treatment. Performed at [facility], [place] [address] [place], [place] [address]   Aerobic/Anaerobic Culture w Gram Stain (surgical/deep wound)     Status: None (Preliminary result)   Collection Time: 10/04/21  8:44 AM   Specimen: Abscess  Result Value Ref Range Status   Specimen Description ABSCESS NECK  Final   Special Requests NONE  Final   Gram Stain   Final    FEW WBC PRESENT, PREDOMINANTLY PMN MODERATE GRAM POSITIVE COCCI IN PAIRS IN CLUSTERS Performed at [facility], [address] [place], [place] [address]    Culture   Final    MODERATE STAPHYLOCOCCUS AUREUS NO ANAEROBES ISOLATED; CULTURE IN PROGRESS FOR 5 DAYS    Report Status PENDING  Incomplete   Organism ID, Bacteria STAPHYLOCOCCUS AUREUS  Final      Susceptibility   Staphylococcus aureus - MIC*    CIPROFLOXACIN <=0.5 SENSITIVE Sensitive     ERYTHROMYCIN <=0.25 SENSITIVE Sensitive     GENTAMICIN <=0.5 SENSITIVE Sensitive     OXACILLIN 0.5 SENSITIVE Sensitive     TETRACYCLINE <=1 SENSITIVE Sensitive     VANCOMYCIN 1 SENSITIVE Sensitive     TRIMETH/SULFA <=10 SENSITIVE Sensitive     CLINDAMYCIN <=0.25 SENSITIVE Sensitive     RIFAMPIN <=0.5 SENSITIVE Sensitive     Inducible Clindamycin NEGATIVE Sensitive     * MODERATE STAPHYLOCOCCUS AUREUS         Radiology Studies: US RENAL  Result Date: 10/07/2021 CLINICAL DATA:  AKI EXAM: RENAL / URINARY TRACT ULTRASOUND COMPLETE COMPARISON:  CT 06/07/2019 FINDINGS: Right Kidney: Renal measurements: 10.5 x 4.8 x 6.1 cm = volume: 159.90 mL. Increased renal cortical  echogenicity. No hydronephrosis. Left Kidney: Renal measurements: 10.9 x 5.5 x 4.3 cm = volume: There 34.20 mL. Increased renal cortical echogenicity. No hydronephrosis. Bladder: Not visualized. Other: Right pleural effusion. IMPRESSION: Increased renal cortical echogenicity bilaterally as can be seen in medical renal disease. No hydronephrosis. Right pleural effusion. Electronically Signed   By: Maurine Simmering M.D.   On: 10/07/2021 15:44        Scheduled Meds:  acetaminophen  650 mg Oral Q6H   calcium acetate  667 mg Oral TID WC   Chlorhexidine Gluconate Cloth  6 each Topical Daily   darbepoetin (ARANESP) injection - NON-DIALYSIS  60 mcg Subcutaneous Q Wed-1800   feeding supplement (NEPRO CARB STEADY)  237 mL Oral BID BM   heparin  5,000 Units Subcutaneous Q8H   insulin aspart  0-6 Units Subcutaneous Q4H   magnesium oxide  400 mg Oral Daily  rosuvastatin  10 mg Oral Daily   sodium bicarbonate  1,300 mg Oral TID   Continuous Infusions:  albumin human 25 g (10/08/21 1254)    ceFAZolin (ANCEF) IV 1 g (10/08/21 1211)   furosemide       LOS: 6 days    Time spent: over 30 min    Fayrene Helper, MD [facility]   To contact the attending provider between 7A-7P or the covering provider during after hours 7P-7A, please log into the web site [email or web address] and access using universal [place] password for that web site. If you do not have the password, please call the [facility] operator.  10/08/2021, 1:27 PM

## 2021-10-08 NOTE — Progress Notes (Signed)
Occupational Therapy Treatment Patient Details Name: Lindsey Murillo MRN: [id number] DOB: [birth year] Today's Date: 10/08/2021   History of present illness Lindsey Murillo is a 26 y.o. female  initially presented to [facility] ED on 5/25 with complaints of not feeling well, lower extremity edema, and posterior neck pain with associated drainage.  Mother at bedside assists with HPI given patient's resistance/evasive to questions.  Her mother reports that patient has not been feeling well for the 2 weeks prior and noted an area of the posterior left neck that started draining 2 days prior to admit. Transferred to [facility] on 5/26 for possible surgery.  Pt underwent I&D left neck abscess on 5/28. Pt with severe sepsis.  Also with right breast abscess that is being watched.  PMH: type 1 diabetes mellitus, stage IV CKD with nephrotic syndrome, anemia of chronic medical/renal disease, diabetic neuropathy, HTN   OT comments  Pt presented with mother in the room and answered most questions at the beginning of the session. However, as pt completed tasks started to make simple one to two word responses. Pt was able to complete at bed level UE washing with increase in time with min to mod assistance and LE washing with max assistance with sitting at EOB. Pt was able to complete supine to sitting with mod to max assist, lateral scooting to [facility] with four attempts with max assist and sitting to supine with max assistance. Pt is progressing towards goals but requires continued skilled Occupational Therapy services.    Recommendations for follow up therapy are one component of a multi-disciplinary discharge planning process, led by the attending physician.  Recommendations may be updated based on patient status, additional functional criteria and insurance authorization.    Follow Up Recommendations  Acute inpatient rehab (3hours/day)    Assistance Recommended at Discharge Frequent or constant Supervision/Assistance   Patient can return home with the following  Two people to help with walking and/or transfers;Two people to help with bathing/dressing/bathroom   Equipment Recommendations   (TBA)    Recommendations for Other Services      Precautions / Restrictions Precautions Precautions: Fall Restrictions Weight Bearing Restrictions: No       Mobility Bed Mobility Overal bed mobility: Needs Assistance Bed Mobility: Rolling, Supine to Sit, Sit to Supine Rolling: Min assist   Supine to sit: Max assist Sit to supine: Max assist   General bed mobility comments: Pt needs increase time to place hands in position    Transfers                         Balance Overall balance assessment: Needs assistance Sitting-balance support: Single extremity supported, Bilateral upper extremity supported, Feet supported Sitting balance-Leahy Scale: Poor Sitting balance - Comments: Pt was able to complete drying of BLE but noted close min guard due to decrease in control of movement in all planes. Postural control: Right lateral lean, Left lateral lean                                 ADL either performed or assessed with clinical judgement   ADL Overall ADL's : Needs assistance/impaired     Grooming: Minimal assistance;Wash/dry hands;Bed level   Upper Body Bathing: Moderate assistance;Cueing for safety;Cueing for sequencing;Bed level   Lower Body Bathing: Maximal assistance;Cueing for safety;Cueing for sequencing;Sit to/from stand;Bed level   Upper Body Dressing : Moderate assistance;Bed level  Lower Body Dressing: Maximal assistance;Bed level                      Extremity/Trunk Assessment Upper Extremity Assessment Upper Extremity Assessment: Generalized weakness   Lower Extremity Assessment Lower Extremity Assessment: Defer to PT evaluation        Vision   Additional Comments: blurry vision reported   Perception     Praxis      Cognition  Arousal/Alertness: Awake/alert Behavior During Therapy: Flat affect Overall Cognitive Status: Impaired/Different from baseline Area of Impairment: Awareness, Following commands, Safety/judgement, Problem solving, Attention                   Current Attention Level: Sustained   Following Commands: Follows one step commands inconsistently, Follows one step commands with increased time Safety/Judgement: Decreased awareness of safety, Decreased awareness of deficits Awareness: Intellectual Problem Solving: Slow processing, Difficulty sequencing, Decreased initiation, Requires verbal cues, Requires tactile cues General Comments: Pt was able to follow simple one step command with increase in time. Pt has decrease in awareness of positioning and needs min guard at EOB        Exercises      Shoulder Instructions       General Comments      Pertinent Vitals/ Pain       Pain Assessment Pain Assessment: Faces Faces Pain Scale: Hurts a little bit Pain Location: grimacing during session with mobility Pain Descriptors / Indicators: Grimacing Pain Intervention(s): Limited activity within patient's tolerance, Monitored during session  Home Living                                          Prior Functioning/Environment              Frequency  Min 2X/week        Progress Toward Goals  OT Goals(current goals can now be found in the care plan section)  Progress towards OT goals: Progressing toward goals  Acute Rehab OT Goals Patient Stated Goal: unable to report OT Goal Formulation: Patient unable to participate in goal setting Time For Goal Achievement: 10/20/21 Potential to Achieve Goals: Good ADL Goals Pt Will Perform Grooming: with min guard assist;sitting Pt Will Perform Upper Body Bathing: with min assist;sitting Pt Will Transfer to Toilet: with mod assist;bedside commode;stand pivot transfer Additional ADL Goal #1: pt will complete bed mobility  min (A) as precursor to adls. Additional ADL Goal #2: pt will follow 2 step command 50% of session  Plan Discharge plan remains appropriate    Co-evaluation                 AM-PAC OT "6 Clicks" Daily Activity     Outcome Measure   Help from another person eating meals?: A Lot Help from another person taking care of personal grooming?: A Lot Help from another person toileting, which includes using toliet, bedpan, or urinal?: A Lot Help from another person bathing (including washing, rinsing, drying)?: A Lot Help from another person to put on and taking off regular upper body clothing?: A Lot Help from another person to put on and taking off regular lower body clothing?: A Lot 6 Click Score: 12    End of Session Equipment Utilized During Treatment: Gait belt  OT Visit Diagnosis: Unsteadiness on feet (R26.81);Muscle weakness (generalized) (M62.81)   Activity Tolerance Patient tolerated treatment well  Patient Left in bed;with call bell/phone within reach;with bed alarm set;with family/visitor present   Nurse Communication          Time: [id number] OT Time Calculation (min): 48 min  Charges: OT General Charges $OT Visit: 1 Visit OT Treatments $Self Care/Home Management : 38-52 mins  Joeseph Amor OTR/L  Acute Rehab Services  [phone] office number [phone] pager number   Joeseph Amor 10/08/2021, 10:09 AM

## 2021-10-08 NOTE — Progress Notes (Signed)
Mobility Specialist Progress Note   10/08/21 1739  Mobility  Activity  (Bed Level Exercises)  Level of Assistance Minimal assist, patient does 75% or more  Assistive Device  (HHA)  Activity Response Tolerated well  $Mobility charge 1 Mobility   Received pt in bed having radiating pain in jaw but agreeable to bed level exercises. Presenting w/ gen weakness for LE related exercises but good representation of strength in UE exercises. No faults witnessed during but pt did c/o about LLE feeling "heavier" than the right one. Left call bell by pt's side and notified RN.  Holland Falling Mobility Specialist Phone Number [phone]

## 2021-10-09 ENCOUNTER — Inpatient Hospital Stay (HOSPITAL_COMMUNITY): Payer: 59

## 2021-10-09 DIAGNOSIS — R6520 Severe sepsis without septic shock: Secondary | ICD-10-CM | POA: Diagnosis not present

## 2021-10-09 DIAGNOSIS — A419 Sepsis, unspecified organism: Secondary | ICD-10-CM | POA: Diagnosis not present

## 2021-10-09 DIAGNOSIS — N179 Acute kidney failure, unspecified: Secondary | ICD-10-CM | POA: Diagnosis not present

## 2021-10-09 LAB — GLUCOSE, CAPILLARY
Glucose-Capillary: 104 mg/dL — ABNORMAL HIGH (ref 70–99)
Glucose-Capillary: 106 mg/dL — ABNORMAL HIGH (ref 70–99)
Glucose-Capillary: 118 mg/dL — ABNORMAL HIGH (ref 70–99)
Glucose-Capillary: 129 mg/dL — ABNORMAL HIGH (ref 70–99)
Glucose-Capillary: 146 mg/dL — ABNORMAL HIGH (ref 70–99)
Glucose-Capillary: 91 mg/dL (ref 70–99)
Glucose-Capillary: 92 mg/dL (ref 70–99)

## 2021-10-09 LAB — COMPREHENSIVE METABOLIC PANEL
ALT: 6 U/L (ref 0–44)
AST: 11 U/L — ABNORMAL LOW (ref 15–41)
Albumin: 1.8 g/dL — ABNORMAL LOW (ref 3.5–5.0)
Alkaline Phosphatase: 164 U/L — ABNORMAL HIGH (ref 38–126)
Anion gap: 13 (ref 5–15)
BUN: 66 mg/dL — ABNORMAL HIGH (ref 6–20)
CO2: 17 mmol/L — ABNORMAL LOW (ref 22–32)
Calcium: 7.6 mg/dL — ABNORMAL LOW (ref 8.9–10.3)
Chloride: 95 mmol/L — ABNORMAL LOW (ref 98–111)
Creatinine, Ser: 6 mg/dL — ABNORMAL HIGH (ref 0.44–1.00)
GFR, Estimated: 9 mL/min — ABNORMAL LOW (ref >60)
Glucose, Bld: 90 mg/dL (ref 70–99)
Potassium: 4.3 mmol/L (ref 3.5–5.1)
Sodium: 125 mmol/L — ABNORMAL LOW (ref 135–145)
Total Bilirubin: 0.6 mg/dL (ref 0.3–1.2)
Total Protein: 4.5 g/dL — ABNORMAL LOW (ref 6.5–8.1)

## 2021-10-09 LAB — AEROBIC/ANAEROBIC CULTURE W GRAM STAIN (SURGICAL/DEEP WOUND)

## 2021-10-09 LAB — CBC WITH DIFFERENTIAL/PLATELET
Abs Immature Granulocytes: 0.22 10*3/uL — ABNORMAL HIGH (ref 0.00–0.07)
Basophils Absolute: 0.1 10*3/uL (ref 0.0–0.1)
Basophils Relative: 0 %
Eosinophils Absolute: 0.3 10*3/uL (ref 0.0–0.5)
Eosinophils Relative: 1 %
HCT: 21.7 % — ABNORMAL LOW (ref 36.0–46.0)
Hemoglobin: 7.2 g/dL — ABNORMAL LOW (ref 12.0–15.0)
Immature Granulocytes: 1 %
Lymphocytes Relative: 7 %
Lymphs Abs: 1.5 10*3/uL (ref 0.7–4.0)
MCH: 28 pg (ref 26.0–34.0)
MCHC: 33.2 g/dL (ref 30.0–36.0)
MCV: 84.4 fL (ref 80.0–100.0)
Monocytes Absolute: 0.5 10*3/uL (ref 0.1–1.0)
Monocytes Relative: 3 %
Neutro Abs: 18.8 10*3/uL — ABNORMAL HIGH (ref 1.7–7.7)
Neutrophils Relative %: 88 %
Platelets: 208 10*3/uL (ref 150–400)
RBC: 2.57 MIL/uL — ABNORMAL LOW (ref 3.87–5.11)
RDW: 16.2 % — ABNORMAL HIGH (ref 11.5–15.5)
WBC: 21.4 10*3/uL — ABNORMAL HIGH (ref 4.0–10.5)
nRBC: 0 % (ref 0.0–0.2)

## 2021-10-09 LAB — PHOSPHORUS: Phosphorus: 5.3 mg/dL — ABNORMAL HIGH (ref 2.5–4.6)

## 2021-10-09 LAB — MAGNESIUM: Magnesium: 1.5 mg/dL — ABNORMAL LOW (ref 1.7–2.4)

## 2021-10-09 MED ORDER — COLLAGENASE 250 UNIT/GM EX OINT
TOPICAL_OINTMENT | Freq: Every day | CUTANEOUS | Status: DC
  Filled 2021-10-09 (×2): qty 30

## 2021-10-09 NOTE — Progress Notes (Signed)
5 Days Post-Op   Chief Complaint/Subjective: No new complaints  Objective: Vital signs in last 24 hours: Temp:  [97.6 F (36.4 C)-98.2 F (36.8 C)] 98.2 F (36.8 C) (06/02 0804) Pulse Rate:  [80-86] 86 (06/02 0804) Resp:  [17-18] 17 (06/02 0804) BP: (111-136)/(73-86) 111/73 (06/02 0804) SpO2:  [97 %-100 %] 97 % (06/02 0804) Last BM Date : 10/07/21 Intake/Output from previous day: 06/01 0701 - 06/02 0700 In: 612 [IV Piggyback:612] Out: 975 [Urine:975] Intake/Output this shift: No intake/output data recorded.  PE: Gen: NAD Resp: nonlabored Card: RRR Neck: left neck wound with fibrinous base, no erythema  Lab Results:  Recent Labs    10/08/21 0158 10/09/21 0630  WBC 26.9* 21.4*  HGB 7.1* 7.2*  HCT 21.9* 21.7*  PLT 238 208   BMET Recent Labs    10/08/21 1657 10/09/21 0630  NA 123* 125*  K 4.3 4.3  CL 95* 95*  CO2 17* 17*  GLUCOSE 93 90  BUN 66* 66*  CREATININE 5.83* 6.00*  CALCIUM 7.4* 7.6*   PT/INR No results for input(s): LABPROT, INR in the last 72 hours. CMP     Component Value Date/Time   NA 125 (L) 10/09/2021 0630   NA 132 (L) 09/24/2021 1453   K 4.3 10/09/2021 0630   CL 95 (L) 10/09/2021 0630   CO2 17 (L) 10/09/2021 0630   GLUCOSE 90 10/09/2021 0630   BUN 66 (H) 10/09/2021 0630   BUN 36 (H) 09/24/2021 1453   CREATININE 6.00 (H) 10/09/2021 0630   CREATININE 0.98 08/29/2019 1637   CALCIUM 7.6 (L) 10/09/2021 0630   PROT 4.5 (L) 10/09/2021 0630   PROT 5.5 (L) 09/24/2021 1453   ALBUMIN 1.8 (L) 10/09/2021 0630   ALBUMIN 2.1 (L) 09/24/2021 1453   AST 11 (L) 10/09/2021 0630   ALT 6 10/09/2021 0630   ALKPHOS 164 (H) 10/09/2021 0630   BILITOT 0.6 10/09/2021 0630   BILITOT <0.2 09/24/2021 1453   GFRNONAA 9 (L) 10/09/2021 0630   GFRAA >60 10/27/2019 2021   Lipase     Component Value Date/Time   LIPASE 34 04/12/2017 1014    Studies/Results: US RENAL  Result Date: 10/07/2021 CLINICAL DATA:  AKI EXAM: RENAL / URINARY TRACT ULTRASOUND  COMPLETE COMPARISON:  CT 06/07/2019 FINDINGS: Right Kidney: Renal measurements: 10.5 x 4.8 x 6.1 cm = volume: 159.90 mL. Increased renal cortical echogenicity. No hydronephrosis. Left Kidney: Renal measurements: 10.9 x 5.5 x 4.3 cm = volume: There 34.20 mL. Increased renal cortical echogenicity. No hydronephrosis. Bladder: Not visualized. Other: Right pleural effusion. IMPRESSION: Increased renal cortical echogenicity bilaterally as can be seen in medical renal disease. No hydronephrosis. Right pleural effusion. Electronically Signed   By: Maurine Simmering M.D.   On: 10/07/2021 15:44    Anti-infectives: Anti-infectives (From admission, onward)    Start     Dose/Rate Route Frequency Ordered Stop   10/06/21 2200  ceFAZolin (ANCEF) IVPB 1 g/50 mL premix        1 g 100 mL/hr over 30 Minutes Intravenous Every 12 hours 10/06/21 1052     10/04/21 2200  ceFEPIme (MAXIPIME) 1 g in sodium chloride 0.9 % 100 mL IVPB  Status:  Discontinued        1 g 200 mL/hr over 30 Minutes Intravenous Every 24 hours 10/04/21 1011 10/05/21 1343   10/03/21 2200  vancomycin (VANCOREADY) IVPB 750 mg/150 mL  Status:  Discontinued        750 mg 150 mL/hr over 60 Minutes Intravenous Every  48 hours 10/02/21 0033 10/02/21 1144   10/03/21 1000  linezolid (ZYVOX) IVPB 600 mg  Status:  Discontinued        600 mg 300 mL/hr over 60 Minutes Intravenous Every 12 hours 10/02/21 1144 10/06/21 1052   10/02/21 2200  ceFEPIme (MAXIPIME) 2 g in sodium chloride 0.9 % 100 mL IVPB  Status:  Discontinued        2 g 200 mL/hr over 30 Minutes Intravenous Every 24 hours 10/02/21 0033 10/04/21 1011   10/02/21 0000  ceFEPIme (MAXIPIME) 2 g in sodium chloride 0.9 % 100 mL IVPB        2 g 200 mL/hr over 30 Minutes Intravenous  Once 10/01/21 2358 10/02/21 0246   10/02/21 0000  metroNIDAZOLE (FLAGYL) IVPB 500 mg        500 mg 100 mL/hr over 60 Minutes Intravenous  Once 10/01/21 2358 10/02/21 0246   10/02/21 0000  vancomycin (VANCOCIN) IVPB 1000 mg/200 mL  premix        1,000 mg 200 mL/hr over 60 Minutes Intravenous  Once 10/01/21 2358 10/02/21 0244       Assessment/Plan  s/p Procedure(s): IRRIGATION AND DEBRIDEMENT NECK ABSCESS 10/04/2021  Left neck abscess  POD4 s/p I&D - continue BID wet to dry dressing to small posterior left neck wound - Cx with MSSA - narrowed to ancef yesterday - no further debridement planned at this time - santyl to wound  Chronic R breast abscess - [place] read finalized and no acute drainable collection - no acute intervention planned, recommend outpatient follow up with PCP   FEN: CM diet  VTE: SQH ID: linezolid 5/27>5/30; Ancef 5/30>>   - below per TRH -  T1DM CKD stage IV Anemia of CKD Diabetic neuropathy HTN   LOS: 7 days   I reviewed last 24 h vitals and pain scores, last 48 h intake and output, last 24 h labs and trends, and last 24 h imaging results.  This care required moderate level of medical decision making.   [facility] 10/09/2021, 8:59 AM Please see Amion for pager number during day hours 7:00am-4:30pm or 7:00am -11:30am on weekends

## 2021-10-09 NOTE — Progress Notes (Signed)
Physical Therapy Treatment Patient Details Name: Lindsey Murillo MRN: [id number] DOB: Feb 07, 1996 Today's Date: 10/09/2021   History of Present Illness Lindsey Murillo is a 26 y.o. female  initially presented to [facility] ED on 5/25 with complaints of not feeling well, lower extremity edema, and posterior neck pain with associated drainage.  Mother at bedside assists with HPI given patient's resistance/evasive to questions.  Her mother reports that patient has not been feeling well for the 2 weeks prior and noted an area of the posterior left neck that started draining 2 days prior to admit. Transferred to [facility] on 5/26 for possible surgery.  Pt underwent I&D left neck abscess on 5/28. Pt with severe sepsis.  Also with right breast abscess that is being watched.  PMH: type 1 diabetes mellitus, stage IV CKD with nephrotic syndrome, anemia of chronic medical/renal disease, diabetic neuropathy, HTN    PT Comments    Pt received supine and agreeable to session despite minimal verbal engagement. Pt able to self mobilize BLE to and off EOB with increased time and min assist to elevate trunk. Pt unable to come to standing with RW with x3 trials and increasingly elevated EOB secondary to inability to coordinate movements to power up and demonstrating very little LE muscle activation. With stedy in place pt abruptly coming to standing without physical assist and able to maintain for >60 seconds for x2 trials. Pt able to stand with SUE support with noted postural sway but not LOB. Pt agreeable to time up in chair at end of session. Pt continues to benefit from skilled PT services to progress toward functional mobility goals.    Recommendations for follow up therapy are one component of a multi-disciplinary discharge planning process, led by the attending physician.  Recommendations may be updated based on patient status, additional functional criteria and insurance authorization.  Follow Up Recommendations   Acute inpatient rehab (3hours/day)     Assistance Recommended at Discharge Frequent or constant Supervision/Assistance  Patient can return home with the following Two people to help with walking and/or transfers;A lot of help with bathing/dressing/bathroom;Assistance with cooking/housework;Direct supervision/assist for medications management;Direct supervision/assist for financial management;Assist for transportation;Help with stairs or ramp for entrance   Equipment Recommendations  Rolling walker (2 wheels);Other (comment) (stedy/mechanical lift pending progress)    Recommendations for Other Services Rehab consult     Precautions / Restrictions Precautions Precautions: Fall Restrictions Weight Bearing Restrictions: No     Mobility  Bed Mobility Overal bed mobility: Needs Assistance Bed Mobility: Supine to Sit     Supine to sit: Min assist     General bed mobility comments: Pt needs increase time to place hands in position and to mobilize LE to and off bed, with x3 trials pt able to push up from sidelying to sit with min assist to elevate trunk    Transfers Overall transfer level: Needs assistance Equipment used: Ambulation equipment used, Rolling walker (2 wheels) Transfers: Sit to/from Stand, Bed to chair/wheelchair/BSC Sit to Stand: Total assist, Min guard, Min assist           General transfer comment: pt unable to come to standing with RW x3 trials from increasingly elevated EOB. pt with increased time to come to standing in stedy and abrupty came to standing without physcial assist in stedy, able to maintain stadning for increased time Transfer via Lift Equipment: Stedy  Ambulation/Gait               General Gait Details: deferred  Stairs             Wheelchair Mobility    Modified Rankin (Stroke Patients Only)       Balance Overall balance assessment: Needs assistance Sitting-balance support: Single extremity supported, Bilateral upper  extremity supported, Feet supported Sitting balance-Leahy Scale: Poor Sitting balance - Comments: Pt was able to complete drying of BLE but noted close min guard due to decrease in control of movement in all planes. Postural control: Right lateral lean, Left lateral lean Standing balance support: Single extremity supported Standing balance-Leahy Scale: Poor Standing balance comment: MinA and biL UE support in stedy to stand 2x >60 sec each. Pt with trunk sway able to lift LUE for reaching but unable to maintain balance and putting hand back on stedy rail quickly                            Cognition Arousal/Alertness: Awake/alert Behavior During Therapy: Flat affect Overall Cognitive Status: Impaired/Different from baseline Area of Impairment: Awareness, Following commands, Safety/judgement, Problem solving, Attention                   Current Attention Level: Sustained   Following Commands: Follows one step commands inconsistently, Follows one step commands with increased time Safety/Judgement: Decreased awareness of safety, Decreased awareness of deficits Awareness: Intellectual Problem Solving: Slow processing, Difficulty sequencing, Decreased initiation, Requires verbal cues, Requires tactile cues General Comments: Pt was able to follow simple one step command with increase in time. Pt has decrease in awareness of positioning and needs min guard at EOB, minimally verablly resposive but alert        Exercises      General Comments General comments (skin integrity, edema, etc.): mother present and supportive throughout session      Pertinent Vitals/Pain Pain Assessment Faces Pain Scale: Hurts a little bit Pain Location: grimacing during session with mobility Pain Descriptors / Indicators: Grimacing    Home Living                          Prior Function            PT Goals (current goals can now be found in the care plan section) Acute Rehab  PT Goals Patient Stated Goal: did not state other than to return to bed PT Goal Formulation: With patient/family Time For Goal Achievement: 10/19/21    Frequency    Min 3X/week      PT Plan Equipment recommendations need to be updated    Co-evaluation              AM-PAC PT "6 Clicks" Mobility   Outcome Measure  Help needed turning from your back to your side while in a flat bed without using bedrails?: A Little Help needed moving from lying on your back to sitting on the side of a flat bed without using bedrails?: A Lot Help needed moving to and from a bed to a chair (including a wheelchair)?: Total Help needed standing up from a chair using your arms (e.g., wheelchair or bedside chair)?: A Lot Help needed to walk in hospital room?: Total Help needed climbing 3-5 steps with a railing? : Total 6 Click Score: 10    End of Session Equipment Utilized During Treatment: Gait belt Activity Tolerance: Patient limited by fatigue;Patient tolerated treatment well Patient left: in chair;with call bell/phone within reach;with family/visitor present Nurse Communication: Mobility status;Need for  lift equipment (NT) PT Visit Diagnosis: Unsteadiness on feet (R26.81);Muscle weakness (generalized) (M62.81);Pain;Difficulty in walking, not elsewhere classified (R26.2) Pain - part of body:  (neck)     Time: 7076-1518 PT Time Calculation (min) (ACUTE ONLY): 30 min  Charges:  $Therapeutic Activity: 23-37 mins                    Ryna Beckstrom R. PTA Acute Rehabilitation Services Office: [place] 10/09/2021, 11:51 AM

## 2021-10-09 NOTE — Progress Notes (Signed)
PROGRESS NOTE    Lindsey Murillo  MEQ:[id number] DOB: [birth year] DOA: 10/01/2021 PCP: Coral Spikes, DO  Chief Complaint  Patient presents with   Leg Swelling    X 2 weeks     Brief Narrative:  Lindsey Murillo is Lindsey Murillo 26 y.o. female with past medical history significant for is Lindsey Murillo 26 year old female with past medical history significant for type 1 diabetes mellitus, stage IV CKD with nephrotic syndrome, anemia of chronic medical/renal disease, diabetic neuropathy, HTN who initially presented to [facility] ED on 5/25 with complaints of not feeling well, lower extremity edema, and posterior neck pain with associated drainage.  Mother at bedside assists with HPI given patient's resistance/evasive to questions.  Her mother reports that patient has not been feeling well for the last 2 weeks and noted Lindsey Murillo area of the posterior left neck that started draining 2 days prior.  Patient/mother denied any trauma to that region.  Pain is worse with any type of range of motion of the neck, nothing seems to make it better.  Mother put some ointment on it which offered her no relief.   In the ED, temperature 98.2 F, HR 102, RR 17, BP 96/67, SPO2 100% on room air.  Sodium 130, potassium 2.7, chloride 107, CO2 14, glucose 179, BUN 66, creatinine 4.76.  BNP 626.0.  WBC 38.9, hemoglobin 8.6, platelet count 330.  Lactic acid 0.7, procalcitonin 1.85.  INR 1.2.  Urinalysis with trace leukocytes, negative nitrite, rare bacteria, 11-20 WBCs.  Pregnancy test negative.  Chest x-ray with cardiomegaly, no active cardiopulmonary disease process.  CT soft tissue neck with area of phlegmon/abscess measuring 4 x 2.4 x 4.8 cm localized upper left neck with enlarged left upper cervical chain lymph nodes.  Blood cultures x2 were drawn.  Patient was started on vancomycin, cefepime, Flagyl.  Initially admitted to [facility].  Valuated by general surgery, Dr. Arnoldo Morale and requested transfer to Lindsey Murillo higher level of care due to multiple  comorbidities.  Case was discussed with ENT, Dr. Benjamine Mola; and location was out of scope of ENT and recommended transfer to [facility] for general surgery evaluation.  Patient was transferred to [facility] and admitted to the hospital service with general surgery in consultation.       Assessment & Plan:   Principal Problem:   Sepsis (Lindsey Murillo) Active Problems:   Abscess of breast, right   Uncontrolled type 1 diabetes mellitus with hyperglycemia ([name])   Pyuria   AKI (acute kidney injury) (Lindsey Murillo)   Nephrotic syndrome   Hyponatremia   Peripheral edema   Hypokalemia   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 chronic kidney disease ([name])   Protein-calorie malnutrition, severe ([name])   Physical deconditioning   Hypomagnesemia   Broken tooth   Neck abscess   CKD (chronic kidney disease) stage 4, GFR 15-29 ml/min ([name])   Assessment and Plan: * Sepsis ([name]) Ruled in for sepsis at admission Due to L neck abscess CT with 4x2.4x4.8 cm phlegmon (limited evaluation due to absence of contrast), enlarged L upper chain cervical chain LN S/p I&D 5/28, per surgery -> wet to dry dressing changes, santyl to wound Concern for R breast infection as well, no definite or focal fluid collection noted on [place] MSSA on 5/28 culture Blood cultures ngtd Narrowed to ancef, continue for now White count finally starting to trend down  Abscess of breast, right [place] without definite or focal fluid collection Needs outpatient follow up  Pyuria UA with bacteria and yeast -  no clear symptoms, suspect contaminant Follow culture only if symptomatic  Uncontrolled type 1 diabetes mellitus with hyperglycemia ([place]) Uncontrolled type I diabetic with [name] last hemoglobin A1c of 9.8 Basal insulin appears it was d/c'd due to hypoglycemia?  Hold cautiously for now (she was just on D5 IVF and still wasn't requiring much insulin) start with q4 SSI and follow closely - doesn't look like she's eating much  Hyponatremia Overloaded - improving with  diuresis Follow with diuresis  AKI (acute kidney injury) (Lindsey Murillo) Creatinine 6 today - worsening with diuresis Baseline ~3 Renal US today without hydro Appears overloaded, LE edema appears to be improving Renal consult, appreciate assistance - possible plan for tunneled HD cath tmrw Diuresis with albumin given edema/nephrotic syndrome  Peripheral edema Diuresis with albumin for nephrotic syndrome  Hypokalemia Resolved, follow  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 chronic kidney disease ([name]) Follow Elevated ferritin, low iron - normal folate, elevated b12  Protein-calorie malnutrition, severe ([name]) noted  Physical deconditioning PT  Hypomagnesemia Replace and follow  Broken tooth Pain related to this Continue abx for now       DVT prophylaxis: heparin Code Status: full Family Communication: mom Disposition:   Status is: Inpatient Remains inpatient appropriate because: need for IV abx   Consultants:  Renal Surgery  Procedures:  I&D 5/28  Antimicrobials:  Anti-infectives (From admission, onward)    Start     Dose/Rate Route Frequency Ordered Stop   10/06/21 2200  ceFAZolin (ANCEF) IVPB 1 g/50 mL premix        1 g 100 mL/hr over 30 Minutes Intravenous Every 12 hours 10/06/21 1052     10/04/21 2200  ceFEPIme (MAXIPIME) 1 g in sodium chloride 0.9 % 100 mL IVPB  Status:  Discontinued        1 g 200 mL/hr over 30 Minutes Intravenous Every 24 hours 10/04/21 1011 10/05/21 1343   10/03/21 2200  vancomycin (VANCOREADY) IVPB 750 mg/150 mL  Status:  Discontinued        750 mg 150 mL/hr over 60 Minutes Intravenous Every 48 hours 10/02/21 0033 10/02/21 1144   10/03/21 1000  linezolid (ZYVOX) IVPB 600 mg  Status:  Discontinued        600 mg 300 mL/hr over 60 Minutes Intravenous Every 12 hours 10/02/21 1144 10/06/21 1052   10/02/21 2200  ceFEPIme (MAXIPIME) 2 g in sodium chloride 0.9 % 100 mL IVPB  Status:  Discontinued        2 g 200 mL/hr over 30 Minutes Intravenous Every 24 hours  10/02/21 0033 10/04/21 1011   10/02/21 0000  ceFEPIme (MAXIPIME) 2 g in sodium chloride 0.9 % 100 mL IVPB        2 g 200 mL/hr over 30 Minutes Intravenous  Once 10/01/21 2358 10/02/21 0246   10/02/21 0000  metroNIDAZOLE (FLAGYL) IVPB 500 mg        500 mg 100 mL/hr over 60 Minutes Intravenous  Once 10/01/21 2358 10/02/21 0246   10/02/21 0000  vancomycin (VANCOCIN) IVPB 1000 mg/200 mL premix        1,000 mg 200 mL/hr over 60 Minutes Intravenous  Once 10/01/21 2358 10/02/21 0244       Subjective: sad with talk of dialysis  Objective: Vitals:   10/08/21 1740 10/08/21 1956 10/09/21 0429 10/09/21 0804  BP: 128/86 115/81 136/81 111/73  Pulse: 81 80 85 86  Resp: 18 18 18 17   Temp: 97.6 F (36.4 C) 98.1 F (36.7 C) 98 F (36.7 C) 98.2 F (  36.8 C)  TempSrc:  Oral Oral Oral  SpO2:  100% 100% 97%  Weight:      Height:        Intake/Output Summary (Last 24 hours) at 10/09/2021 1448 Last data filed at 10/09/2021 0432 Gross per 24 hour  Intake 612 ml  Output 975 ml  Net -363 ml    Filed Weights   10/04/21 0703 10/05/21 0358 10/08/21 0500  Weight: 63.5 kg 70.6 kg 74.1 kg    Examination:  General: No acute distress. Cardiovascular: RRR Lungs: unlabored Neurological: Alert and oriented 3. Moves all extremities 4 . Cranial nerves II through XII grossly intact. Skin: open L neck wound with visible fibrinous exudate to base, no fluctuance or crepitus on exam Extremities: bilateral LE edema appears to be improving   Data Reviewed: I have personally reviewed following labs and imaging studies  CBC: Recent Labs  Lab 10/03/21 0116 10/04/21 0051 10/05/21 0125 10/06/21 0148 10/07/21 0230 10/08/21 0158 10/09/21 0630  WBC 37.9*   < > 39.3* 35.9* 33.2* 26.9* 21.4*  NEUTROABS 35.6*  --   --   --   --  24.6* 18.8*  HGB 7.5*   < > 7.6* 7.2* 7.4* 7.1* 7.2*  HCT 22.8*   < > 24.0* 22.3* 22.9* 21.9* 21.7*  MCV 85.4   < > 89.6 86.1 86.1 86.6 84.4  PLT 315   < > 287 304 286 238 208    < > = values in this interval not displayed.    Basic Metabolic Panel: Recent Labs  Lab 10/03/21 0116 10/04/21 0051 10/05/21 0125 10/06/21 0148 10/07/21 0230 10/08/21 0158 10/08/21 1657 10/09/21 0630  NA 127*   < > 127* 126* 121* 122* 123* 125*  K 3.3*   < > 5.0 4.8 4.5 4.3 4.3 4.3  CL 104   < > 107 100 96* 94* 95* 95*  CO2 13*   < > 11* 15* 16* 16* 17* 17*  GLUCOSE 68*   < > 162* 122* 103* 97 93 90  BUN 64*   < > 63* 65* 63* 63* 66* 66*  CREATININE 4.80*   < > 5.17* 5.45* 5.41* 5.54* 5.83* 6.00*  CALCIUM 7.6*   < > 7.5* 7.8* 7.4* 7.5* 7.4* 7.6*  MG 1.7  --   --   --   --  1.5*  --  1.5*  PHOS  --    < > 5.5* 5.1* 5.0* 5.1*  --  5.3*   < > = values in this interval not displayed.    GFR: Estimated Creatinine Clearance: 14.8 mL/min (Khiry Pasquariello) (by C-G formula based on SCr of 6 mg/dL (H)).  Liver Function Tests: Recent Labs  Lab 10/03/21 0116 10/04/21 0051 10/06/21 0148 10/07/21 0230 10/07/21 0937 10/08/21 0158 10/09/21 0630  AST 11*  --   --   --   --  10* 11*  ALT 14  --   --   --   --  11 6  ALKPHOS 242*  --   --   --   --  190* 164*  BILITOT 0.6  --   --   --   --  0.3 0.6  PROT 5.4*  --   --   --   --  4.7* 4.5*  ALBUMIN 1.7*   < > <1.5* <1.5* <1.5* 1.7* 1.8*   < > = values in this interval not displayed.    CBG: Recent Labs  Lab 10/08/21 2001 10/09/21 0026 10/09/21 0427 10/09/21  0802 10/09/21 1126  GLUCAP 95 106* 104* 91 92     Recent Results (from the past 240 hour(s))  Culture, blood (Routine x 2)     Status: None   Collection Time: 10/01/21 10:05 PM   Specimen: BLOOD  Result Value Ref Range Status   Specimen Description BLOOD RIGHT ANTECUBITAL  Final   Special Requests   Final    BOTTLES DRAWN AEROBIC AND ANAEROBIC Blood Culture adequate volume   Culture   Final    NO GROWTH 5 DAYS Performed at [facility], [address] [place], [place] [address]    Report Status 10/06/2021 FINAL  Final  Culture, blood (Routine x 2)     Status: None    Collection Time: 10/01/21 10:05 PM   Specimen: BLOOD  Result Value Ref Range Status   Specimen Description BLOOD BLOOD LEFT HAND  Final   Special Requests   Final    BOTTLES DRAWN AEROBIC AND ANAEROBIC Blood Culture adequate volume   Culture   Final    NO GROWTH 5 DAYS Performed at [facility], [address] [place], [place] [address]    Report Status 10/06/2021 FINAL  Final  Surgical pcr screen     Status: Abnormal   Collection Time: 10/03/21  1:00 PM   Specimen: Nasal Mucosa; Nasal Swab  Result Value Ref Range Status   MRSA, PCR NEGATIVE NEGATIVE Final   Staphylococcus aureus POSITIVE ([place]) NEGATIVE Final    Comment: (NOTE) The Xpert SA Assay (FDA approved for NASAL specimens in patients 48 years of age and older), is one component of [place] comprehensive surveillance program. It is not intended to diagnose infection nor to guide or monitor treatment. Performed at [facility], [place] [address] [place], [place] [address]   Aerobic/Anaerobic Culture w Gram Stain (surgical/deep wound)     Status: None (Preliminary result)   Collection Time: 10/04/21  8:44 AM   Specimen: Abscess  Result Value Ref Range Status   Specimen Description ABSCESS NECK  Final   Special Requests NONE  Final   Gram Stain   Final    FEW WBC PRESENT, PREDOMINANTLY PMN MODERATE GRAM POSITIVE COCCI IN PAIRS IN CLUSTERS Performed at [facility], [address] [place],  [address]    Culture   Final    MODERATE STAPHYLOCOCCUS AUREUS NO ANAEROBES ISOLATED; CULTURE IN PROGRESS FOR 5 DAYS    Report Status PENDING  Incomplete   Organism ID, Bacteria STAPHYLOCOCCUS AUREUS  Final      Susceptibility   Staphylococcus aureus - MIC*    CIPROFLOXACIN <=0.5 SENSITIVE Sensitive     ERYTHROMYCIN <=0.25 SENSITIVE Sensitive     GENTAMICIN <=0.5 SENSITIVE Sensitive     OXACILLIN 0.5 SENSITIVE Sensitive     TETRACYCLINE <=1 SENSITIVE Sensitive     VANCOMYCIN 1 SENSITIVE Sensitive     TRIMETH/SULFA <=10  SENSITIVE Sensitive     CLINDAMYCIN <=0.25 SENSITIVE Sensitive     RIFAMPIN <=0.5 SENSITIVE Sensitive     Inducible Clindamycin NEGATIVE Sensitive     * MODERATE STAPHYLOCOCCUS AUREUS         Radiology Studies: US RENAL  Result Date: 10/07/2021 CLINICAL DATA:  AKI EXAM: RENAL / URINARY TRACT ULTRASOUND COMPLETE COMPARISON:  CT 06/07/2019 FINDINGS: Right Kidney: Renal measurements: 10.5 x 4.8 x 6.1 cm = volume: 159.90 mL. Increased renal cortical echogenicity. No hydronephrosis. Left Kidney: Renal measurements: 10.9 x 5.5 x 4.3 cm = volume: There 34.20 mL. Increased renal cortical  echogenicity. No hydronephrosis. Bladder: Not visualized. Other: Right pleural effusion. IMPRESSION: Increased renal cortical echogenicity bilaterally as can be seen in medical renal disease. No hydronephrosis. Right pleural effusion. Electronically Signed   By: Maurine Simmering M.D.   On: 10/07/2021 15:44        Scheduled Meds:  acetaminophen  650 mg Oral Q6H   calcium acetate  667 mg Oral TID WC   Chlorhexidine Gluconate Cloth  6 each Topical Daily   collagenase   Topical Daily   darbepoetin (ARANESP) injection - NON-DIALYSIS  60 mcg Subcutaneous Q Wed-1800   feeding supplement (NEPRO CARB STEADY)  237 mL Oral BID BM   heparin  5,000 Units Subcutaneous Q8H   insulin aspart  0-6 Units Subcutaneous Q4H   magnesium oxide  400 mg Oral Daily   rosuvastatin  10 mg Oral Daily   sodium bicarbonate  1,300 mg Oral TID   Continuous Infusions:  albumin human 25 g (10/09/21 1000)    ceFAZolin (ANCEF) IV 1 g (10/09/21 1020)   furosemide 120 mg (10/09/21 0900)     LOS: 7 days    Time spent: over 30 min    Fayrene Helper, MD [facility]   To contact the attending provider between 7A-7P or the covering provider during after hours 7P-7A, please log into the web site [email or web address] and access using universal [place] password for that web site. If you do not have the password, please call the hospital  operator.  10/09/2021, 2:48 PM

## 2021-10-09 NOTE — Progress Notes (Signed)
[facility] Progress Note    Assessment/ Plan:   AKI on CKD 4:  -baseline Cr ~3 (follows with Dr. Moshe Cipro).  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 Not sure how much renal reserve she has at this point given severe underlying scar tissue on her renal biopsy -AKI likely related to hemodynamic insults in the context of severe sepsis. -UA w/ bacteria but with yeast as well. Will defer to primary service for treatment if needed -no obstruction on u/s -Cr worsening, now 6. Volume status + Na are slightly better. I discussed the potential plan for needing to start dialysis soon, I will tentatively keep her NPO after midnight for potential need to start tomorrow via [facility]. My only concern is that she may not be a candidate for [facility] at this time given her leukocytosis. Discussed this at length with patient and mother and they seem agreeable at this time -Maintain MAP>65 for optimal renal perfusion.  -Avoid nephrotoxic medications including NSAIDs and iodinated intravenous contrast exposure unless the latter is absolutely indicated.  Preferred narcotic agents for pain control are hydromorphone, fentanyl, and methadone. Morphine should not be used. Avoid Baclofen and avoid oral sodium phosphate and magnesium citrate based laxatives / bowel preps. Continue strict Input and Output monitoring. Will monitor the patient closely with you and intervene or adjust therapy as indicated by changes in clinical status/labs    Severe sepsis secondary to posterior neck abscess -s/p I&D 5/28--MSSA on operative culture -abx per primary service, currently on cefazolin   Hyponatremia (acute on chronic), improving -hypervolemic on exam, AKI also likely a culprit -agree with lasix+albumin, Na improved with increased lasix dose -fluid restrict 1.5L/day   Edema/nephrotic syndrome -agree with lasix w/ albumin support, c/w  lasix 120mg  BID (w/ continued albumin support). -strict I/O   H/O Hypertension: -with softer BP's initially which have improved. home anti-HTNs on hold   Metabolic acidosis, chronic -chronic issue. likely related to AKI, s/p bicarb gtt. On nahco3 1300mg  TID. Bicarb stable   Anemia due to chronic disease: -Transfuse for Hgb<7 g/dL -has adequate iron stores, started ESA   Secondary hyperparathyroidism/CKD-MBD -c/w phoslo   Uncontrolled Diabetes Mellitus Type 1 with Hyperglycemia -mgmt per primary service   Severe protein calorie malnutrition -with underlying chronic nephrotic syndrome -push protein, agree with albumin   Recommendations conveyed to primary service.   Subjective:   No acute events. 0.9L urine output charted from overnight. Mom present as well When prompted, she shakes her head 'no' when I asked her about n/v, loss of appetite, dysgeusia, new shakes/tremors   Objective:   BP 111/73 (BP Location: Right Arm)   Pulse 86   Temp 98.2 F (36.8 C) (Oral)   Resp 17   Ht 5' 7.01" (1.702 m)   Wt 74.1 kg   SpO2 97%   BMI 25.58 kg/m   Intake/Output Summary (Last 24 hours) at 10/09/2021 1327 Last data filed at 10/09/2021 2841 Gross per 24 hour  Intake 612 ml  Output 975 ml  Net -363 ml   Weight change:   Physical Exam: LKG:[id number] ill appearing, sitting in chair CVS:RRR Resp:diminished air entry bibasilar, normal WOB YQI:HKVQ Ext:++pitting edema b/l LE's (slightly improved) Neuro: awake, alert, speech clear and coherent, no asterixis  Imaging: US RENAL  Result Date: 10/07/2021 CLINICAL DATA:  AKI EXAM: RENAL / URINARY TRACT ULTRASOUND COMPLETE COMPARISON:  CT 06/07/2019 FINDINGS: Right Kidney: Renal measurements: 10.5 x 4.8 x 6.1 cm = volume: 159.90 mL.  Increased renal cortical echogenicity. No hydronephrosis. Left Kidney: Renal measurements: 10.9 x 5.5 x 4.3 cm = volume: There 34.20 mL. Increased renal cortical echogenicity. No hydronephrosis. Bladder: Not  visualized. Other: Right pleural effusion. IMPRESSION: Increased renal cortical echogenicity bilaterally as can be seen in medical renal disease. No hydronephrosis. Right pleural effusion. Electronically Signed   By: Maurine Simmering M.D.   On: 10/07/2021 15:44    Labs: BMET Recent Labs  Lab 10/04/21 0051 10/05/21 0125 10/06/21 0148 10/07/21 0230 10/08/21 0158 10/08/21 1657 10/09/21 0630  NA 127* 127* 126* 121* 122* 123* 125*  K 5.0 5.0 4.8 4.5 4.3 4.3 4.3  CL 107 107 100 96* 94* 95* 95*  CO2 12* 11* 15* 16* 16* 17* 17*  GLUCOSE 116* 162* 122* 103* 97 93 90  BUN 63* 63* 65* 63* 63* 66* 66*  CREATININE 4.90* 5.17* 5.45* 5.41* 5.54* 5.83* 6.00*  CALCIUM 7.4* 7.5* 7.8* 7.4* 7.5* 7.4* 7.6*  PHOS 5.2* 5.5* 5.1* 5.0* 5.1*  --  5.3*   CBC Recent Labs  Lab 10/03/21 0116 10/04/21 0051 10/06/21 0148 10/07/21 0230 10/08/21 0158 10/09/21 0630  WBC 37.9*   < > 35.9* 33.2* 26.9* 21.4*  NEUTROABS 35.6*  --   --   --  24.6* 18.8*  HGB 7.5*   < > 7.2* 7.4* 7.1* 7.2*  HCT 22.8*   < > 22.3* 22.9* 21.9* 21.7*  MCV 85.4   < > 86.1 86.1 86.6 84.4  PLT 315   < > 304 286 238 208   < > = values in this interval not displayed.    Medications:     acetaminophen  650 mg Oral Q6H   calcium acetate  667 mg Oral TID WC   Chlorhexidine Gluconate Cloth  6 each Topical Daily   collagenase   Topical Daily   darbepoetin (ARANESP) injection - NON-DIALYSIS  60 mcg Subcutaneous Q Wed-1800   feeding supplement (NEPRO CARB STEADY)  237 mL Oral BID BM   heparin  5,000 Units Subcutaneous Q8H   insulin aspart  0-6 Units Subcutaneous Q4H   magnesium oxide  400 mg Oral Daily   rosuvastatin  10 mg Oral Daily   sodium bicarbonate  1,300 mg Oral TID      Gean Quint, MD [facility] 10/09/2021, 1:27 PM

## 2021-10-10 DIAGNOSIS — R6520 Severe sepsis without septic shock: Secondary | ICD-10-CM | POA: Diagnosis not present

## 2021-10-10 DIAGNOSIS — A419 Sepsis, unspecified organism: Secondary | ICD-10-CM | POA: Diagnosis not present

## 2021-10-10 DIAGNOSIS — N179 Acute kidney failure, unspecified: Secondary | ICD-10-CM | POA: Diagnosis not present

## 2021-10-10 LAB — COMPREHENSIVE METABOLIC PANEL
ALT: <5 U/L (ref 0–44)
AST: 12 U/L — ABNORMAL LOW (ref 15–41)
Albumin: 2 g/dL — ABNORMAL LOW (ref 3.5–5.0)
Alkaline Phosphatase: 162 U/L — ABNORMAL HIGH (ref 38–126)
Anion gap: 13 (ref 5–15)
BUN: 68 mg/dL — ABNORMAL HIGH (ref 6–20)
CO2: 19 mmol/L — ABNORMAL LOW (ref 22–32)
Calcium: 7.7 mg/dL — ABNORMAL LOW (ref 8.9–10.3)
Chloride: 95 mmol/L — ABNORMAL LOW (ref 98–111)
Creatinine, Ser: 5.97 mg/dL — ABNORMAL HIGH (ref 0.44–1.00)
GFR, Estimated: 9 mL/min — ABNORMAL LOW (ref >60)
Glucose, Bld: 113 mg/dL — ABNORMAL HIGH (ref 70–99)
Potassium: 4.2 mmol/L (ref 3.5–5.1)
Sodium: 127 mmol/L — ABNORMAL LOW (ref 135–145)
Total Bilirubin: 0.6 mg/dL (ref 0.3–1.2)
Total Protein: 4.6 g/dL — ABNORMAL LOW (ref 6.5–8.1)

## 2021-10-10 LAB — CBC WITH DIFFERENTIAL/PLATELET
Abs Immature Granulocytes: 0.11 10*3/uL — ABNORMAL HIGH (ref 0.00–0.07)
Basophils Absolute: 0.1 10*3/uL (ref 0.0–0.1)
Basophils Relative: 1 %
Eosinophils Absolute: 0.4 10*3/uL (ref 0.0–0.5)
Eosinophils Relative: 3 %
HCT: 21.4 % — ABNORMAL LOW (ref 36.0–46.0)
Hemoglobin: 7.1 g/dL — ABNORMAL LOW (ref 12.0–15.0)
Immature Granulocytes: 1 %
Lymphocytes Relative: 10 %
Lymphs Abs: 1.4 10*3/uL (ref 0.7–4.0)
MCH: 28 pg (ref 26.0–34.0)
MCHC: 33.2 g/dL (ref 30.0–36.0)
MCV: 84.3 fL (ref 80.0–100.0)
Monocytes Absolute: 0.5 10*3/uL (ref 0.1–1.0)
Monocytes Relative: 3 %
Neutro Abs: 11.8 10*3/uL — ABNORMAL HIGH (ref 1.7–7.7)
Neutrophils Relative %: 82 %
Platelets: 173 10*3/uL (ref 150–400)
RBC: 2.54 MIL/uL — ABNORMAL LOW (ref 3.87–5.11)
RDW: 15.9 % — ABNORMAL HIGH (ref 11.5–15.5)
WBC: 14.3 10*3/uL — ABNORMAL HIGH (ref 4.0–10.5)
nRBC: 0 % (ref 0.0–0.2)

## 2021-10-10 LAB — GLUCOSE, CAPILLARY
Glucose-Capillary: 115 mg/dL — ABNORMAL HIGH (ref 70–99)
Glucose-Capillary: 112 mg/dL — ABNORMAL HIGH (ref 70–99)
Glucose-Capillary: 122 mg/dL — ABNORMAL HIGH (ref 70–99)
Glucose-Capillary: 183 mg/dL — ABNORMAL HIGH (ref 70–99)
Glucose-Capillary: 169 mg/dL — ABNORMAL HIGH (ref 70–99)

## 2021-10-10 LAB — MAGNESIUM: Magnesium: 1.7 mg/dL (ref 1.7–2.4)

## 2021-10-10 LAB — PHOSPHORUS: Phosphorus: 5.2 mg/dL — ABNORMAL HIGH (ref 2.5–4.6)

## 2021-10-10 MED ORDER — ALBUMIN HUMAN 25 % IV SOLN
25.0000 g | Freq: Two times a day (BID) | INTRAVENOUS | Status: AC
  Administered 2021-10-10 – 2021-10-12 (×6): 25 g via INTRAVENOUS
  Filled 2021-10-10 (×6): qty 100

## 2021-10-10 MED ORDER — TAMSULOSIN HCL 0.4 MG PO CAPS
0.4000 mg | ORAL_CAPSULE | Freq: Every day | ORAL | Status: DC
  Administered 2021-10-10 – 2021-10-21 (×12): 0.4 mg via ORAL
  Filled 2021-10-10 (×13): qty 1

## 2021-10-10 NOTE — Progress Notes (Signed)
PROGRESS NOTE    Lindsey Murillo  NIO:[id number] DOB: [birth year] DOA: 10/01/2021 PCP: Lindsey Spikes, DO  Chief Complaint  Patient presents with   Leg Swelling    X 2 weeks     Brief Narrative:  Lindsey Murillo is Lindsey Murillo 26 y.o. female with past medical history significant for is Lindsey Murillo 26 year old female with past medical history significant for type 1 diabetes mellitus, stage IV CKD with nephrotic syndrome, anemia of chronic medical/renal disease, diabetic neuropathy, HTN who initially presented to [facility] ED on 5/25 with complaints of not feeling well, lower extremity edema, and posterior neck pain with associated drainage.  Mother at bedside assists with HPI given patient's resistance/evasive to questions.  Her mother reports that patient has not been feeling well for the last 2 weeks and noted Lindsey Murillo area of the posterior left neck that started draining 2 days prior.  Patient/mother denied any trauma to that region.  Pain is worse with any type of range of motion of the neck, nothing seems to make it better.  Mother put some ointment on it which offered her no relief.   In the ED, temperature 98.2 F, HR 102, RR 17, BP 96/67, SPO2 100% on room air.  Sodium 130, potassium 2.7, chloride 107, CO2 14, glucose 179, BUN 66, creatinine 4.76.  BNP 626.0.  WBC 38.9, hemoglobin 8.6, platelet count 330.  Lactic acid 0.7, procalcitonin 1.85.  INR 1.2.  Urinalysis with trace leukocytes, negative nitrite, rare bacteria, 11-20 WBCs.  Pregnancy test negative.  Chest x-ray with cardiomegaly, no active cardiopulmonary disease process.  CT soft tissue neck with area of phlegmon/abscess measuring 4 x 2.4 x 4.8 cm localized upper left neck with enlarged left upper cervical chain lymph nodes.  Blood cultures x2 were drawn.  Patient was started on vancomycin, cefepime, Flagyl.  Initially admitted to [facility].  Valuated by general surgery, Dr. Arnoldo Morale and requested transfer to [facility] higher level of care due to multiple  comorbidities.  Case was discussed with ENT, Dr. Benjamine Mola; and location was out of scope of ENT and recommended transfer to [facility] for general surgery evaluation.  Patient was transferred to [facility] and admitted to the hospital service with general surgery in consultation.       Assessment & Plan:   Principal Problem:   Sepsis ([place]) Active Problems:   Cellulitis of right breast   Uncontrolled type 1 diabetes mellitus with hyperglycemia ([name])   Pyuria   AKI (acute kidney injury) ([employer])   Nephrotic syndrome   Hyponatremia   Peripheral edema   Hypokalemia   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 chronic kidney disease ([name])   Protein-calorie malnutrition, severe ([name])   Physical deconditioning   Hypomagnesemia   Broken tooth   Neck abscess   CKD (chronic kidney disease) stage 4, GFR 15-29 ml/min ([name])   Assessment and Plan: * Sepsis ([place]) Ruled in for sepsis at admission Due to L neck abscess CT with 4x2.4x4.8 cm phlegmon (limited evaluation due to absence of contrast), enlarged L upper chain cervical chain LN S/p I&D 5/28, per surgery -> wet to dry dressing changes, santyl to wound Concern for R breast infection as well, no definite or focal fluid collection noted on [place] MSSA on 5/28 culture Blood cultures ngtd Narrowed to ancef, continue for now White count continuing to downtrend  Cellulitis of right breast US without definite or focal fluid collection Needs outpatient follow up   Pyuria UA with bacteria and yeast - no  clear symptoms, suspect contaminant Follow culture only if symptomatic  Uncontrolled type 1 diabetes mellitus with hyperglycemia ([place]) Uncontrolled type I diabetic with Mccabe Gloria last hemoglobin A1c of 9.8 Basal insulin appears it was d/c'd due to hypoglycemia?  Hold cautiously for now (she was just on D5 IVF and still wasn't requiring much insulin) start with q4 SSI and follow closely - doesn't look like she's eating much  Hyponatremia Overloaded - improving with  diuresis Follow with diuresis  AKI (acute kidney injury) ([place]) Creatinine 5.97 today - relatively stable with diuresis today Baseline ~3 Renal US today without hydro Appears overloaded, LE edema appears to be improving Renal consult, appreciate assistance - possible plan for tunneled HD cath tmrw Diuresis with albumin given edema/nephrotic syndrome  Peripheral edema Diuresis with albumin for nephrotic syndrome  Hypokalemia Resolved, follow  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 chronic kidney disease ([name]) Follow Elevated ferritin, low iron - normal folate, elevated b12  Protein-calorie malnutrition, severe ([name]) noted  Physical deconditioning PT  Hypomagnesemia Replace and follow  Broken tooth Pain related to this Continue abx for now       DVT prophylaxis: heparin Code Status: full Family Communication: mom Disposition:   Status is: Inpatient Remains inpatient appropriate because: need for IV abx   Consultants:  Renal Surgery  Procedures:  I&D 5/28  Antimicrobials:  Anti-infectives (From admission, onward)    Start     Dose/Rate Route Frequency Ordered Stop   10/06/21 2200  ceFAZolin (ANCEF) IVPB 1 g/50 mL premix        1 g 100 mL/hr over 30 Minutes Intravenous Every 12 hours 10/06/21 1052     10/04/21 2200  ceFEPIme (MAXIPIME) 1 g in sodium chloride 0.9 % 100 mL IVPB  Status:  Discontinued        1 g 200 mL/hr over 30 Minutes Intravenous Every 24 hours 10/04/21 1011 10/05/21 1343   10/03/21 2200  vancomycin (VANCOREADY) IVPB 750 mg/150 mL  Status:  Discontinued        750 mg 150 mL/hr over 60 Minutes Intravenous Every 48 hours 10/02/21 0033 10/02/21 1144   10/03/21 1000  linezolid (ZYVOX) IVPB 600 mg  Status:  Discontinued        600 mg 300 mL/hr over 60 Minutes Intravenous Every 12 hours 10/02/21 1144 10/06/21 1052   10/02/21 2200  ceFEPIme (MAXIPIME) 2 g in sodium chloride 0.9 % 100 mL IVPB  Status:  Discontinued        2 g 200 mL/hr over 30 Minutes Intravenous  Every 24 hours 10/02/21 0033 10/04/21 1011   10/02/21 0000  ceFEPIme (MAXIPIME) 2 g in sodium chloride 0.9 % 100 mL IVPB        2 g 200 mL/hr over 30 Minutes Intravenous  Once 10/01/21 2358 10/02/21 0246   10/02/21 0000  metroNIDAZOLE (FLAGYL) IVPB 500 mg        500 mg 100 mL/hr over 60 Minutes Intravenous  Once 10/01/21 2358 10/02/21 0246   10/02/21 0000  vancomycin (VANCOCIN) IVPB 1000 mg/200 mL premix        1,000 mg 200 mL/hr over 60 Minutes Intravenous  Once 10/01/21 2358 10/02/21 0244       Subjective: No new complaints  Objective: Vitals:   10/09/21 2352 10/10/21 0500 10/10/21 0620 10/10/21 0811  BP: 122/82  (!) 137/93 (!) 141/89  Pulse: 82  82 85  Resp: 17  16 17   Temp: 97.6 F (36.4 C)     TempSrc: Oral  SpO2: 100%  100% 100%  Weight:  68.8 kg    Height:        Intake/Output Summary (Last 24 hours) at 10/10/2021 1521 Last data filed at 10/10/2021 1500 Gross per 24 hour  Intake 120 ml  Output 1500 ml  Net -1380 ml    Filed Weights   10/05/21 0358 10/08/21 0500 10/10/21 0500  Weight: 70.6 kg 74.1 kg 68.8 kg    Examination:  General: No acute distress. Cardiovascular: RRR Lungs: unlabored Abdomen: Soft, nontender, nondistended  Neurological: Alert and oriented 3. Moves all extremities 4. Cranial nerves II through XII grossly intact. Skin: R breast erythema and swelling (nursing at bedside for exam), no fluctuance - open L neck wound stable Extremities: improving bilateral LE swelling  Data Reviewed: I have personally reviewed following labs and imaging studies  CBC: Recent Labs  Lab 10/06/21 0148 10/07/21 0230 10/08/21 0158 10/09/21 0630 10/10/21 0534  WBC 35.9* 33.2* 26.9* 21.4* 14.3*  NEUTROABS  --   --  24.6* 18.8* 11.8*  HGB 7.2* 7.4* 7.1* 7.2* 7.1*  HCT 22.3* 22.9* 21.9* 21.7* 21.4*  MCV 86.1 86.1 86.6 84.4 84.3  PLT 304 286 238 208 017    Basic Metabolic Panel: Recent Labs  Lab 10/06/21 0148 10/07/21 0230 10/08/21 0158  10/08/21 1657 10/09/21 0630 10/10/21 0534  NA 126* 121* 122* 123* 125* 127*  K 4.8 4.5 4.3 4.3 4.3 4.2  CL 100 96* 94* 95* 95* 95*  CO2 15* 16* 16* 17* 17* 19*  GLUCOSE 122* 103* 97 93 90 113*  BUN 65* 63* 63* 66* 66* 68*  CREATININE 5.45* 5.41* 5.54* 5.83* 6.00* 5.97*  CALCIUM 7.8* 7.4* 7.5* 7.4* 7.6* 7.7*  MG  --   --  1.5*  --  1.5* 1.7  PHOS 5.1* 5.0* 5.1*  --  5.3* 5.2*    GFR: Estimated Creatinine Clearance: 13.8 mL/min (Kharma Sampsel) (by C-G formula based on SCr of 5.97 mg/dL (H)).  Liver Function Tests: Recent Labs  Lab 10/07/21 0230 10/07/21 0937 10/08/21 0158 10/09/21 0630 10/10/21 0534  AST  --   --  10* 11* 12*  ALT  --   --  11 6 <5  ALKPHOS  --   --  190* 164* 162*  BILITOT  --   --  0.3 0.6 0.6  PROT  --   --  4.7* 4.5* 4.6*  ALBUMIN <1.5* <1.5* 1.7* 1.8* 2.0*    CBG: Recent Labs  Lab 10/09/21 2018 10/09/21 2328 10/10/21 0501 10/10/21 0813 10/10/21 1207  GLUCAP 129* 118* 115* 112* 122*     Recent Results (from the past 240 hour(s))  Culture, blood (Routine x 2)     Status: None   Collection Time: 10/01/21 10:05 PM   Specimen: BLOOD  Result Value Ref Range Status   Specimen Description BLOOD RIGHT ANTECUBITAL  Final   Special Requests   Final    BOTTLES DRAWN AEROBIC AND ANAEROBIC Blood Culture adequate volume   Culture   Final    NO GROWTH 5 DAYS Performed at [facility], [address] [place], [place] [address]    Report Status 10/06/2021 FINAL  Final  Culture, blood (Routine x 2)     Status: None   Collection Time: 10/01/21 10:05 PM   Specimen: BLOOD  Result Value Ref Range Status   Specimen Description BLOOD BLOOD LEFT HAND  Final   Special Requests   Final    BOTTLES DRAWN AEROBIC AND ANAEROBIC Blood Culture adequate volume  Culture   Final    NO GROWTH 5 DAYS Performed at [facility], [address], [place], [place] [address]    Report Status 10/06/2021 FINAL  Final  Surgical pcr screen     Status: Abnormal   Collection Time:  10/03/21  1:00 PM   Specimen: Nasal Mucosa; Nasal Swab  Result Value Ref Range Status   MRSA, PCR NEGATIVE NEGATIVE Final   Staphylococcus aureus POSITIVE (Vaniyah Lansky) NEGATIVE Final    Comment: (NOTE) The Xpert SA Assay (FDA approved for NASAL specimens in patients 2 years of age and older), is one component of Coreyon Nicotra comprehensive surveillance program. It is not intended to diagnose infection nor to guide or monitor treatment. Performed at [facility], [place] [address] [place], [place] [address]   Aerobic/Anaerobic Culture w Gram Stain (surgical/deep wound)     Status: None   Collection Time: 10/04/21  8:44 AM   Specimen: Abscess  Result Value Ref Range Status   Specimen Description ABSCESS NECK  Final   Special Requests NONE  Final   Gram Stain   Final    FEW WBC PRESENT, PREDOMINANTLY PMN MODERATE GRAM POSITIVE COCCI IN PAIRS IN CLUSTERS    Culture   Final    MODERATE STAPHYLOCOCCUS AUREUS NO ANAEROBES ISOLATED Performed at [facility], [place] [address] [place], [place] [address]    Report Status 10/09/2021 FINAL  Final   Organism ID, Bacteria STAPHYLOCOCCUS AUREUS  Final      Susceptibility   Staphylococcus aureus - MIC*    CIPROFLOXACIN <=0.5 SENSITIVE Sensitive     ERYTHROMYCIN <=0.25 SENSITIVE Sensitive     GENTAMICIN <=0.5 SENSITIVE Sensitive     OXACILLIN 0.5 SENSITIVE Sensitive     TETRACYCLINE <=1 SENSITIVE Sensitive     VANCOMYCIN 1 SENSITIVE Sensitive     TRIMETH/SULFA <=10 SENSITIVE Sensitive     CLINDAMYCIN <=0.25 SENSITIVE Sensitive     RIFAMPIN <=0.5 SENSITIVE Sensitive     Inducible Clindamycin NEGATIVE Sensitive     * MODERATE STAPHYLOCOCCUS AUREUS         Radiology Studies: No results found.      Scheduled Meds:  acetaminophen  650 mg Oral Q6H   calcium acetate  667 mg Oral TID WC   Chlorhexidine Gluconate Cloth  6 each Topical Daily   collagenase   Topical Daily   darbepoetin (ARANESP) injection - NON-DIALYSIS  60 mcg Subcutaneous Q  Wed-1800   feeding supplement (NEPRO CARB STEADY)  237 mL Oral BID BM   heparin  5,000 Units Subcutaneous Q8H   insulin aspart  0-6 Units Subcutaneous Q4H   magnesium oxide  400 mg Oral Daily   rosuvastatin  10 mg Oral Daily   sodium bicarbonate  1,300 mg Oral TID   tamsulosin  0.4 mg Oral QPC supper   Continuous Infusions:  albumin human 25 g (10/10/21 1055)    ceFAZolin (ANCEF) IV 1 g (10/10/21 0953)   furosemide 120 mg (10/10/21 0900)     LOS: 8 days    Time spent: over 30 min    Fayrene Helper, MD [facility]   To contact the attending provider between 7A-7P or the covering provider during after hours 7P-7A, please log into the web site [email or web address] and access using universal [place] password for that web site. If you do not have the password, please call the hospital operator.  10/10/2021, 3:21 PM

## 2021-10-10 NOTE — Progress Notes (Signed)
[facility] Progress Note    Assessment/ Plan:   AKI on CKD 4:  -baseline Cr ~3 (follows with Dr. Moshe Cipro).  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 Not sure how much renal reserve she has at this point given severe underlying scar tissue on her renal biopsy -AKI likely related to hemodynamic insults in the context of severe sepsis. -UA w/ bacteria but with yeast as well. Will defer to primary service for treatment if needed -no obstruction on u/s -Creatinine relatively stable today.  DC n.p.o. since we are not anticipating on starting her on dialysis today (volume status improving, not having any uremic symptoms) however this could very well change over the next few days depending on the trajectory of her labs. -Maintain MAP>65 for optimal renal perfusion.  -Avoid nephrotoxic medications including NSAIDs and iodinated intravenous contrast exposure unless the latter is absolutely indicated.  Preferred narcotic agents for pain control are hydromorphone, fentanyl, and methadone. Morphine should not be used. Avoid Baclofen and avoid oral sodium phosphate and magnesium citrate based laxatives / bowel preps. Continue strict Input and Output monitoring. Will monitor the patient closely with you and intervene or adjust therapy as indicated by changes in clinical status/labs    Severe sepsis secondary to posterior neck abscess -s/p I&D 5/28--MSSA on operative culture -abx per primary service, currently on cefazolin   Hyponatremia (acute on chronic), improving -hypervolemic on exam, AKI also likely a culprit -agree with lasix+albumin, Na improved, now up to 127 -fluid restrict 1.5L/day   Edema/nephrotic syndrome -agree with lasix w/ albumin support, would c/w lasix 120mg  BID (w/ continued albumin support) especially as her volume status is improving -strict I/O   H/O Hypertension: -with softer  BP's initially which have improved. home anti-HTNs on hold   Metabolic acidosis, chronic -chronic issue. likely related to AKI, s/p bicarb gtt. On nahco3 1300mg  TID. Bicarb up to 19   Anemia due to chronic disease: -Transfuse for Hgb<7 g/dL -has adequate iron stores, started ESA   Secondary hyperparathyroidism/CKD-MBD -c/w phoslo   Uncontrolled Diabetes Mellitus Type 1 with Hyperglycemia -mgmt per primary service   Severe protein calorie malnutrition -with underlying chronic nephrotic syndrome -push protein, agree with albumin   Recommendations conveyed to primary service.   Subjective:   No acute events. Much more interactive and cheerful today.  Mom and grandmother at bedside.  Patient reports that she is feeling skinnier, belly is not as "jiggly". She otherwise denies any fevers, chills, chest pain, shortness of breath, dysgeusia, loss of appetite, nausea/vomiting.   Objective:   BP (!) 141/89 (BP Location: Left Arm)   Pulse 85   Temp 97.6 F (36.4 C) (Oral)   Resp 17   Ht 5' 7.01" (1.702 m)   Wt 68.8 kg   SpO2 100%   BMI 23.75 kg/m   Intake/Output Summary (Last 24 hours) at 10/10/2021 1244 Last data filed at 10/10/2021 0650 Gross per 24 hour  Intake 120 ml  Output 1050 ml  Net -930 ml   Weight change:   Physical Exam: WCH:[id number] ill appearing, sitting up in bed eating lunch, NAD HEENT: left posteriolateral neck wound CVS:RRR Resp:diminished air entry bibasilar, normal WOB TIR:WERX Ext:+pitting edema b/l LE's (improved) Neuro: awake, alert, speech clear and coherent, no asterixis  Imaging: No results found.  Labs: BMET Recent Labs  Lab 10/04/21 0051 10/05/21 0125 10/06/21 0148 10/07/21 0230 10/08/21 0158 10/08/21 1657 10/09/21 0630 10/10/21 0534  NA 127* 127* 126* 121* 122*  123* 125* 127*  K 5.0 5.0 4.8 4.5 4.3 4.3 4.3 4.2  CL 107 107 100 96* 94* 95* 95* 95*  CO2 12* 11* 15* 16* 16* 17* 17* 19*  GLUCOSE 116* 162* 122* 103* 97 93 90 113*   BUN 63* 63* 65* 63* 63* 66* 66* 68*  CREATININE 4.90* 5.17* 5.45* 5.41* 5.54* 5.83* 6.00* 5.97*  CALCIUM 7.4* 7.5* 7.8* 7.4* 7.5* 7.4* 7.6* 7.7*  PHOS 5.2* 5.5* 5.1* 5.0* 5.1*  --  5.3* 5.2*   CBC Recent Labs  Lab 10/07/21 0230 10/08/21 0158 10/09/21 0630 10/10/21 0534  WBC 33.2* 26.9* 21.4* 14.3*  NEUTROABS  --  24.6* 18.8* 11.8*  HGB 7.4* 7.1* 7.2* 7.1*  HCT 22.9* 21.9* 21.7* 21.4*  MCV 86.1 86.6 84.4 84.3  PLT 286 238 208 173    Medications:     acetaminophen  650 mg Oral Q6H   calcium acetate  667 mg Oral TID WC   Chlorhexidine Gluconate Cloth  6 each Topical Daily   collagenase   Topical Daily   darbepoetin (ARANESP) injection - NON-DIALYSIS  60 mcg Subcutaneous Q Wed-1800   feeding supplement (NEPRO CARB STEADY)  237 mL Oral BID BM   heparin  5,000 Units Subcutaneous Q8H   insulin aspart  0-6 Units Subcutaneous Q4H   magnesium oxide  400 mg Oral Daily   rosuvastatin  10 mg Oral Daily   sodium bicarbonate  1,300 mg Oral TID   tamsulosin  0.4 mg Oral QPC supper      Gean Quint, MD [facility] 10/10/2021, 12:44 PM

## 2021-10-11 DIAGNOSIS — N179 Acute kidney failure, unspecified: Secondary | ICD-10-CM | POA: Diagnosis not present

## 2021-10-11 DIAGNOSIS — R6520 Severe sepsis without septic shock: Secondary | ICD-10-CM | POA: Diagnosis not present

## 2021-10-11 DIAGNOSIS — A419 Sepsis, unspecified organism: Secondary | ICD-10-CM | POA: Diagnosis not present

## 2021-10-11 LAB — CBC WITH DIFFERENTIAL/PLATELET
Abs Immature Granulocytes: 0.14 10*3/uL — ABNORMAL HIGH (ref 0.00–0.07)
Basophils Absolute: 0.1 10*3/uL (ref 0.0–0.1)
Basophils Relative: 0 %
Eosinophils Absolute: 0.4 10*3/uL (ref 0.0–0.5)
Eosinophils Relative: 3 %
HCT: 19.6 % — ABNORMAL LOW (ref 36.0–46.0)
Hemoglobin: 6.5 g/dL — CL (ref 12.0–15.0)
Immature Granulocytes: 1 %
Lymphocytes Relative: 11 %
Lymphs Abs: 1.5 10*3/uL (ref 0.7–4.0)
MCH: 28.1 pg (ref 26.0–34.0)
MCHC: 33.2 g/dL (ref 30.0–36.0)
MCV: 84.8 fL (ref 80.0–100.0)
Monocytes Absolute: 0.6 10*3/uL (ref 0.1–1.0)
Monocytes Relative: 5 %
Neutro Abs: 10.8 10*3/uL — ABNORMAL HIGH (ref 1.7–7.7)
Neutrophils Relative %: 80 %
Platelets: 147 10*3/uL — ABNORMAL LOW (ref 150–400)
RBC: 2.31 MIL/uL — ABNORMAL LOW (ref 3.87–5.11)
RDW: 16 % — ABNORMAL HIGH (ref 11.5–15.5)
WBC: 13.5 10*3/uL — ABNORMAL HIGH (ref 4.0–10.5)
nRBC: 0.1 % (ref 0.0–0.2)

## 2021-10-11 LAB — COMPREHENSIVE METABOLIC PANEL
ALT: <5 U/L (ref 0–44)
AST: 11 U/L — ABNORMAL LOW (ref 15–41)
Albumin: 2.3 g/dL — ABNORMAL LOW (ref 3.5–5.0)
Alkaline Phosphatase: 151 U/L — ABNORMAL HIGH (ref 38–126)
Anion gap: 13 (ref 5–15)
BUN: 68 mg/dL — ABNORMAL HIGH (ref 6–20)
CO2: 18 mmol/L — ABNORMAL LOW (ref 22–32)
Calcium: 7.5 mg/dL — ABNORMAL LOW (ref 8.9–10.3)
Chloride: 95 mmol/L — ABNORMAL LOW (ref 98–111)
Creatinine, Ser: 5.91 mg/dL — ABNORMAL HIGH (ref 0.44–1.00)
GFR, Estimated: 9 mL/min — ABNORMAL LOW (ref >60)
Glucose, Bld: 131 mg/dL — ABNORMAL HIGH (ref 70–99)
Potassium: 4.1 mmol/L (ref 3.5–5.1)
Sodium: 126 mmol/L — ABNORMAL LOW (ref 135–145)
Total Bilirubin: 0.5 mg/dL (ref 0.3–1.2)
Total Protein: 4.7 g/dL — ABNORMAL LOW (ref 6.5–8.1)

## 2021-10-11 LAB — PREPARE RBC (CROSSMATCH)

## 2021-10-11 LAB — GLUCOSE, CAPILLARY
Glucose-Capillary: 111 mg/dL — ABNORMAL HIGH (ref 70–99)
Glucose-Capillary: 117 mg/dL — ABNORMAL HIGH (ref 70–99)
Glucose-Capillary: 125 mg/dL — ABNORMAL HIGH (ref 70–99)
Glucose-Capillary: 131 mg/dL — ABNORMAL HIGH (ref 70–99)
Glucose-Capillary: 144 mg/dL — ABNORMAL HIGH (ref 70–99)
Glucose-Capillary: 170 mg/dL — ABNORMAL HIGH (ref 70–99)

## 2021-10-11 LAB — MAGNESIUM: Magnesium: 1.8 mg/dL (ref 1.7–2.4)

## 2021-10-11 LAB — PHOSPHORUS: Phosphorus: 5.2 mg/dL — ABNORMAL HIGH (ref 2.5–4.6)

## 2021-10-11 MED ORDER — POLYETHYLENE GLYCOL 3350 17 G PO PACK
17.0000 g | PACK | Freq: Every day | ORAL | Status: DC | PRN
  Administered 2021-10-11: 17 g via ORAL
  Filled 2021-10-11: qty 1

## 2021-10-11 MED ORDER — SODIUM CHLORIDE 0.9% IV SOLUTION: Freq: Once | INTRAVENOUS | Status: AC

## 2021-10-11 NOTE — Progress Notes (Signed)
[facility] Progress Note    Assessment/ Plan:   AKI on CKD 4:  -baseline Cr ~3 (follows with Dr. Moshe Cipro).  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 Not sure how much renal reserve she has at this point given severe underlying scar tissue/chronicity on her renal biopsy -AKI likely related to hemodynamic insults in the context of severe sepsis. -UA w/ bacteria but with yeast as well-likely contaminant. Will defer to primary service for treatment if needed -no obstruction on u/s -Creatinine relatively stable today. No indications for renal replacement therapy at this time, however this could very well change over the next few days depending on the trajectory of her labs. -Maintain MAP>65 for optimal renal perfusion.  -Avoid nephrotoxic medications including NSAIDs and iodinated intravenous contrast exposure unless the latter is absolutely indicated.  Preferred narcotic agents for pain control are hydromorphone, fentanyl, and methadone. Morphine should not be used. Avoid Baclofen and avoid oral sodium phosphate and magnesium citrate based laxatives / bowel preps. Continue strict Input and Output monitoring. Will monitor the patient closely with you and intervene or adjust therapy as indicated by changes in clinical status/labs    Severe sepsis secondary to posterior neck abscess -s/p I&D 5/28--MSSA on operative culture -abx per primary service, currently on cefazolin   Hyponatremia (acute on chronic), improving -hypervolemic on exam, AKI also likely a culprit -agree with lasix+albumin, Na stable at 126 today -fluid restrict 1.5L/day   Edema/nephrotic syndrome -agree with lasix w/ albumin support, would c/w lasix 120mg  BID (w/ continued albumin support) especially as her volume status had been improving -strict I/O   H/O Hypertension: -with softer BP's initially which have improved. home  anti-HTNs on hold   Metabolic acidosis, chronic -chronic issue. likely related to AKI, s/p bicarb gtt. On nahco3 1300mg  TID. Bicarb stable at 18   Anemia due to chronic disease: -Transfuse for Hgb<7 g/dL -has adequate iron stores, started ESA -1u prbc today per primary service   Secondary hyperparathyroidism/CKD-MBD -c/w phoslo   Uncontrolled Diabetes Mellitus Type 1 with Hyperglycemia -mgmt per primary service   Severe protein calorie malnutrition -with underlying chronic nephrotic syndrome -push protein, agree with albumin   Recommendations conveyed to primary service.   Subjective:   No acute events. She reports that her legs still feel heavy. She does report that she tries to vomit sometimes and nothing comes out. Mom (who is at bedside) reports this as a chronic issue and not a new issue. Hgb down to 6.5, due for 1u prbc. Foley has been out and she is happy about being able to pee on her own.   Objective:   BP 135/86 (BP Location: Right Arm)   Pulse 88   Temp (!) 97.5 F (36.4 C) (Oral)   Resp 16   Ht 5' 7.01" (1.702 m)   Wt 68.8 kg   SpO2 99%   BMI 23.75 kg/m   Intake/Output Summary (Last 24 hours) at 10/11/2021 1217 Last data filed at 10/10/2021 2124 Gross per 24 hour  Intake 148.75 ml  Output 700 ml  Net -551.25 ml   Weight change:   Physical Exam: JKD:TOIZTIWPYKD ill appearing, NAD, sitting up in bed HEENT: left posteriolateral neck wound CVS:RRR Resp:normal WOB XIP:JASN Ext:+pitting edema b/l LE's (unchanged) Neuro: awake, alert, speech clear and coherent, no asterixis  Imaging: No results found.  Labs: BMET Recent Labs  Lab 10/05/21 0125 10/06/21 0148 10/07/21 0230 10/08/21 0158 10/08/21 1657 10/09/21 0630 10/10/21 0534 10/11/21  0105  NA 127* 126* 121* 122* 123* 125* 127* 126*  K 5.0 4.8 4.5 4.3 4.3 4.3 4.2 4.1  CL 107 100 96* 94* 95* 95* 95* 95*  CO2 11* 15* 16* 16* 17* 17* 19* 18*  GLUCOSE 162* 122* 103* 97 93 90 113* 131*  BUN 63*  65* 63* 63* 66* 66* 68* 68*  CREATININE 5.17* 5.45* 5.41* 5.54* 5.83* 6.00* 5.97* 5.91*  CALCIUM 7.5* 7.8* 7.4* 7.5* 7.4* 7.6* 7.7* 7.5*  PHOS 5.5* 5.1* 5.0* 5.1*  --  5.3* 5.2* 5.2*   CBC Recent Labs  Lab 10/08/21 0158 10/09/21 0630 10/10/21 0534 10/11/21 0105  WBC 26.9* 21.4* 14.3* 13.5*  NEUTROABS 24.6* 18.8* 11.8* 10.8*  HGB 7.1* 7.2* 7.1* 6.5*  HCT 21.9* 21.7* 21.4* 19.6*  MCV 86.6 84.4 84.3 84.8  PLT 238 208 173 147*    Medications:     sodium chloride   Intravenous Once   acetaminophen  650 mg Oral Q6H   calcium acetate  667 mg Oral TID WC   Chlorhexidine Gluconate Cloth  6 each Topical Daily   collagenase   Topical Daily   darbepoetin (ARANESP) injection - NON-DIALYSIS  60 mcg Subcutaneous Q Wed-1800   feeding supplement (NEPRO CARB STEADY)  237 mL Oral BID BM   heparin  5,000 Units Subcutaneous Q8H   insulin aspart  0-6 Units Subcutaneous Q4H   magnesium oxide  400 mg Oral Daily   rosuvastatin  10 mg Oral Daily   sodium bicarbonate  1,300 mg Oral TID   tamsulosin  0.4 mg Oral QPC supper      Gean Quint, MD [facility] 10/11/2021, 12:17 PM

## 2021-10-11 NOTE — Progress Notes (Signed)
Pt hgb 6.5. provider made aware, blood consent signed, type and screen done with lab, provider will get consent from team due to fluid overload, no new orders at this time

## 2021-10-11 NOTE — Progress Notes (Signed)
PROGRESS NOTE    Lindsey Murillo  UYQ:[id number] DOB: 09-27-1995 DOA: 10/01/2021 PCP: Coral Spikes, DO  Chief Complaint  Patient presents with   Leg Swelling    X 2 weeks     Brief Narrative:  Lindsey Murillo is Lindsey Murillo 26 y.o. female with past medical history significant for is Lindsey Murillo 26 year old female with past medical history significant for type 1 diabetes mellitus, stage IV CKD with nephrotic syndrome, anemia of chronic medical/renal disease, diabetic neuropathy, HTN who initially presented to [facility] ED on 5/25 with complaints of not feeling well, lower extremity edema, and posterior neck pain with associated drainage.  Mother at bedside assists with HPI given patient's resistance/evasive to questions.  Her mother reports that patient has not been feeling well for the last 2 weeks and noted [name] area of the posterior left neck that started draining 2 days prior.  Patient/mother denied any trauma to that region.  Pain is worse with any type of range of motion of the neck, nothing seems to make it better.  Mother put some ointment on it which offered her no relief.   In the ED, temperature 98.2 F, HR 102, RR 17, BP 96/67, SPO2 100% on room air.  Sodium 130, potassium 2.7, chloride 107, CO2 14, glucose 179, BUN 66, creatinine 4.76.  BNP 626.0.  WBC 38.9, hemoglobin 8.6, platelet count 330.  Lactic acid 0.7, procalcitonin 1.85.  INR 1.2.  Urinalysis with trace leukocytes, negative nitrite, rare bacteria, 11-20 WBCs.  Pregnancy test negative.  Chest x-ray with cardiomegaly, no active cardiopulmonary disease process.  CT soft tissue neck with area of phlegmon/abscess measuring 4 x 2.4 x 4.8 cm localized upper left neck with enlarged left upper cervical chain lymph nodes.  Blood cultures x2 were drawn.  Patient was started on vancomycin, cefepime, Flagyl.  Initially admitted to [facility].  Valuated by general surgery, Dr. Arnoldo Morale and requested transfer to [facility] higher level of care due to multiple  comorbidities.  Case was discussed with ENT, Dr. Benjamine Mola; and location was out of scope of ENT and recommended transfer to [facility] for general surgery evaluation.  Patient was transferred to [facility] and admitted to the hospital service with general surgery in consultation.       Assessment & Plan:   Principal Problem:   Sepsis ([place]) Active Problems:   Cellulitis of right breast   Uncontrolled type 1 diabetes mellitus with hyperglycemia ([name])   Pyuria   AKI (acute kidney injury) ([place])   Nephrotic syndrome   Hyponatremia   Peripheral edema   Hypokalemia   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 chronic kidney disease ([name])   Protein-calorie malnutrition, severe ([name])   Physical deconditioning   Hypomagnesemia   Broken tooth   Neck abscess   CKD (chronic kidney disease) stage 4, GFR 15-29 ml/min ([name])   Assessment and Plan: * Sepsis ([place]) Ruled in for sepsis at admission Due to L neck abscess CT with 4x2.4x4.8 cm phlegmon (limited evaluation due to absence of contrast), enlarged L upper chain cervical chain LN S/p I&D 5/28, per surgery -> wet to dry dressing changes, santyl to wound Concern for R breast infection as well, no definite or focal fluid collection noted on [place] MSSA on 5/28 culture Blood cultures ngtd Narrowed to ancef, continue for now White count continuing to downtrend  Cellulitis of right breast US without definite or focal fluid collection Needs outpatient follow up   Pyuria UA with bacteria and yeast - no  clear symptoms, suspect contaminant Follow culture only if symptomatic  Uncontrolled type 1 diabetes mellitus with hyperglycemia ([place]) Uncontrolled type I diabetic with Dorrien Grunder last hemoglobin A1c of 9.8 Basal insulin appears it was d/c'd due to hypoglycemia?  Hold cautiously for now (she was just on D5 IVF and still wasn't requiring much insulin) start with q4 SSI and follow closely - doesn't look like she's eating much  Hyponatremia Overloaded - improving with diuresis  (relatively stable in past 24 hrs, follow) Follow with diuresis  AKI (acute kidney injury) ([place]) Creatinine 5.91 today - relatively stable with diuresis today Baseline ~3 Renal US today without hydro Appears overloaded, LE edema appears to be improving gradually Renal consult, appreciate assistance - possible plan for tunneled HD cath tmrw Diuresis with albumin given edema/nephrotic syndrome  Peripheral edema Diuresis with albumin for nephrotic syndrome  Hypokalemia Resolved, follow  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 chronic kidney disease ([name]) Follow Elevated ferritin, low iron - normal folate, elevated b12 Transfuse for <7 today, discussed with pt/family  Protein-calorie malnutrition, severe ([name]) noted  Physical deconditioning PT  Hypomagnesemia Replace and follow  Broken tooth Pain related to this Continue abx for now       DVT prophylaxis: heparin Code Status: full Family Communication: mom Disposition:   Status is: Inpatient Remains inpatient appropriate because: need for IV abx   Consultants:  Renal Surgery  Procedures:  I&D 5/28  Antimicrobials:  Anti-infectives (From admission, onward)    Start     Dose/Rate Route Frequency Ordered Stop   10/06/21 2200  ceFAZolin (ANCEF) IVPB 1 g/50 mL premix        1 g 100 mL/hr over 30 Minutes Intravenous Every 12 hours 10/06/21 1052     10/04/21 2200  ceFEPIme (MAXIPIME) 1 g in sodium chloride 0.9 % 100 mL IVPB  Status:  Discontinued        1 g 200 mL/hr over 30 Minutes Intravenous Every 24 hours 10/04/21 1011 10/05/21 1343   10/03/21 2200  vancomycin (VANCOREADY) IVPB 750 mg/150 mL  Status:  Discontinued        750 mg 150 mL/hr over 60 Minutes Intravenous Every 48 hours 10/02/21 0033 10/02/21 1144   10/03/21 1000  linezolid (ZYVOX) IVPB 600 mg  Status:  Discontinued        600 mg 300 mL/hr over 60 Minutes Intravenous Every 12 hours 10/02/21 1144 10/06/21 1052   10/02/21 2200  ceFEPIme (MAXIPIME) 2 g in sodium  chloride 0.9 % 100 mL IVPB  Status:  Discontinued        2 g 200 mL/hr over 30 Minutes Intravenous Every 24 hours 10/02/21 0033 10/04/21 1011   10/02/21 0000  ceFEPIme (MAXIPIME) 2 g in sodium chloride 0.9 % 100 mL IVPB        2 g 200 mL/hr over 30 Minutes Intravenous  Once 10/01/21 2358 10/02/21 0246   10/02/21 0000  metroNIDAZOLE (FLAGYL) IVPB 500 mg        500 mg 100 mL/hr over 60 Minutes Intravenous  Once 10/01/21 2358 10/02/21 0246   10/02/21 0000  vancomycin (VANCOCIN) IVPB 1000 mg/200 mL premix        1,000 mg 200 mL/hr over 60 Minutes Intravenous  Once 10/01/21 2358 10/02/21 0244       Subjective: No new complaints  Objective: Vitals:   10/11/21 0400 10/11/21 0741 10/11/21 1335 10/11/21 1352  BP: (!) 126/5 135/86 (!) 147/89 (!) 159/93  Pulse: 85 88 88 90  Resp: 17 16 15  16  Temp: 97.8 F (36.6 C) (!) 97.5 F (36.4 C) 97.6 F (36.4 C) (!) 97.5 F (36.4 C)  TempSrc: Oral Oral Oral Oral  SpO2: 100% 99% 98% 100%  Weight:      Height:        Intake/Output Summary (Last 24 hours) at 10/11/2021 1445 Last data filed at 10/10/2021 2124 Gross per 24 hour  Intake 148.75 ml  Output 700 ml  Net -551.25 ml    Filed Weights   10/05/21 0358 10/08/21 0500 10/10/21 0500  Weight: 70.6 kg 74.1 kg 68.8 kg    Examination:  General: No acute distress. Cardiovascular: unlabored Lungs: RRR Abdomen: Soft, nontender, nondistended Neurological: Alert and oriented 3. Moves all extremities 4 with equal strength. Cranial nerves II through XII grossly intact. Extremities: bilateral LE edema   Data Reviewed: I have personally reviewed following labs and imaging studies  CBC: Recent Labs  Lab 10/07/21 0230 10/08/21 0158 10/09/21 0630 10/10/21 0534 10/11/21 0105  WBC 33.2* 26.9* 21.4* 14.3* 13.5*  NEUTROABS  --  24.6* 18.8* 11.8* 10.8*  HGB 7.4* 7.1* 7.2* 7.1* 6.5*  HCT 22.9* 21.9* 21.7* 21.4* 19.6*  MCV 86.1 86.6 84.4 84.3 84.8  PLT 286 238 208 173 147*    Basic  Metabolic Panel: Recent Labs  Lab 10/07/21 0230 10/08/21 0158 10/08/21 1657 10/09/21 0630 10/10/21 0534 10/11/21 0105  NA 121* 122* 123* 125* 127* 126*  K 4.5 4.3 4.3 4.3 4.2 4.1  CL 96* 94* 95* 95* 95* 95*  CO2 16* 16* 17* 17* 19* 18*  GLUCOSE 103* 97 93 90 113* 131*  BUN 63* 63* 66* 66* 68* 68*  CREATININE 5.41* 5.54* 5.83* 6.00* 5.97* 5.91*  CALCIUM 7.4* 7.5* 7.4* 7.6* 7.7* 7.5*  MG  --  1.5*  --  1.5* 1.7 1.8  PHOS 5.0* 5.1*  --  5.3* 5.2* 5.2*    GFR: Estimated Creatinine Clearance: 13.9 mL/min (Bellamarie Pflug) (by C-G formula based on SCr of 5.91 mg/dL (H)).  Liver Function Tests: Recent Labs  Lab 10/07/21 0937 10/08/21 0158 10/09/21 0630 10/10/21 0534 10/11/21 0105  AST  --  10* 11* 12* 11*  ALT  --  11 6 <5 <5  ALKPHOS  --  190* 164* 162* 151*  BILITOT  --  0.3 0.6 0.6 0.5  PROT  --  4.7* 4.5* 4.6* 4.7*  ALBUMIN <1.5* 1.7* 1.8* 2.0* 2.3*    CBG: Recent Labs  Lab 10/10/21 2124 10/11/21 0045 10/11/21 0403 10/11/21 0740 10/11/21 1358  GLUCAP 169* 144* 131* 117* 125*     Recent Results (from the past 240 hour(s))  Culture, blood (Routine x 2)     Status: None   Collection Time: 10/01/21 10:05 PM   Specimen: BLOOD  Result Value Ref Range Status   Specimen Description BLOOD RIGHT ANTECUBITAL  Final   Special Requests   Final    BOTTLES DRAWN AEROBIC AND ANAEROBIC Blood Culture adequate volume   Culture   Final    NO GROWTH 5 DAYS Performed at [facility], [address] [place], [place] [address]    Report Status 10/06/2021 FINAL  Final  Culture, blood (Routine x 2)     Status: None   Collection Time: 10/01/21 10:05 PM   Specimen: BLOOD  Result Value Ref Range Status   Specimen Description BLOOD BLOOD LEFT HAND  Final   Special Requests   Final    BOTTLES DRAWN AEROBIC AND ANAEROBIC Blood Culture adequate volume   Culture  Final    NO GROWTH 5 DAYS Performed at [facility], [address] [place], [place] [address]    Report Status 10/06/2021 FINAL   Final  Surgical pcr screen     Status: Abnormal   Collection Time: 10/03/21  1:00 PM   Specimen: Nasal Mucosa; Nasal Swab  Result Value Ref Range Status   MRSA, PCR NEGATIVE NEGATIVE Final   Staphylococcus aureus POSITIVE (Jossalyn Forgione) NEGATIVE Final    Comment: (NOTE) The Xpert SA Assay (FDA approved for NASAL specimens in patients 6 years of age and older), is one component of Grethel Zenk comprehensive surveillance program. It is not intended to diagnose infection nor to guide or monitor treatment. Performed at [facility], [place] [address] [place], [place] [address]   Aerobic/Anaerobic Culture w Gram Stain (surgical/deep wound)     Status: None   Collection Time: 10/04/21  8:44 AM   Specimen: Abscess  Result Value Ref Range Status   Specimen Description ABSCESS NECK  Final   Special Requests NONE  Final   Gram Stain   Final    FEW WBC PRESENT, PREDOMINANTLY PMN MODERATE GRAM POSITIVE COCCI IN PAIRS IN CLUSTERS    Culture   Final    MODERATE STAPHYLOCOCCUS AUREUS NO ANAEROBES ISOLATED Performed at [facility], [place] [address], [place], [place] [address]    Report Status 10/09/2021 FINAL  Final   Organism ID, Bacteria STAPHYLOCOCCUS AUREUS  Final      Susceptibility   Staphylococcus aureus - MIC*    CIPROFLOXACIN <=0.5 SENSITIVE Sensitive     ERYTHROMYCIN <=0.25 SENSITIVE Sensitive     GENTAMICIN <=0.5 SENSITIVE Sensitive     OXACILLIN 0.5 SENSITIVE Sensitive     TETRACYCLINE <=1 SENSITIVE Sensitive     VANCOMYCIN 1 SENSITIVE Sensitive     TRIMETH/SULFA <=10 SENSITIVE Sensitive     CLINDAMYCIN <=0.25 SENSITIVE Sensitive     RIFAMPIN <=0.5 SENSITIVE Sensitive     Inducible Clindamycin NEGATIVE Sensitive     * MODERATE STAPHYLOCOCCUS AUREUS         Radiology Studies: No results found.      Scheduled Meds:  acetaminophen  650 mg Oral Q6H   calcium acetate  667 mg Oral TID WC   Chlorhexidine Gluconate Cloth  6 each Topical Daily   collagenase   Topical Daily    darbepoetin (ARANESP) injection - NON-DIALYSIS  60 mcg Subcutaneous Q Wed-1800   feeding supplement (NEPRO CARB STEADY)  237 mL Oral BID BM   heparin  5,000 Units Subcutaneous Q8H   insulin aspart  0-6 Units Subcutaneous Q4H   magnesium oxide  400 mg Oral Daily   rosuvastatin  10 mg Oral Daily   sodium bicarbonate  1,300 mg Oral TID   tamsulosin  0.4 mg Oral QPC supper   Continuous Infusions:  albumin human 25 g (10/11/21 0948)    ceFAZolin (ANCEF) IV 1 g (10/11/21 1002)   furosemide 120 mg (10/11/21 0951)     LOS: 9 days    Time spent: over 30 min    Fayrene Helper, MD [facility]   To contact the attending provider between 7A-7P or the covering provider during after hours 7P-7A, please log into the web site [email or web address] and access using universal [place] password for that web site. If you do not have the password, please call the hospital operator.  10/11/2021, 2:45 PM

## 2021-10-12 DIAGNOSIS — R6520 Severe sepsis without septic shock: Secondary | ICD-10-CM | POA: Diagnosis not present

## 2021-10-12 DIAGNOSIS — A419 Sepsis, unspecified organism: Secondary | ICD-10-CM | POA: Diagnosis not present

## 2021-10-12 DIAGNOSIS — N179 Acute kidney failure, unspecified: Secondary | ICD-10-CM | POA: Diagnosis not present

## 2021-10-12 LAB — TYPE AND SCREEN
ABO/RH(D): A POS
Antibody Screen: NEGATIVE
Unit division: 0

## 2021-10-12 LAB — COMPREHENSIVE METABOLIC PANEL
ALT: <5 U/L (ref 0–44)
AST: 12 U/L — ABNORMAL LOW (ref 15–41)
Albumin: 2.7 g/dL — ABNORMAL LOW (ref 3.5–5.0)
Alkaline Phosphatase: 155 U/L — ABNORMAL HIGH (ref 38–126)
Anion gap: 13 (ref 5–15)
BUN: 69 mg/dL — ABNORMAL HIGH (ref 6–20)
CO2: 20 mmol/L — ABNORMAL LOW (ref 22–32)
Calcium: 8.1 mg/dL — ABNORMAL LOW (ref 8.9–10.3)
Chloride: 97 mmol/L — ABNORMAL LOW (ref 98–111)
Creatinine, Ser: 5.96 mg/dL — ABNORMAL HIGH (ref 0.44–1.00)
GFR, Estimated: 9 mL/min — ABNORMAL LOW (ref >60)
Glucose, Bld: 94 mg/dL (ref 70–99)
Potassium: 4.2 mmol/L (ref 3.5–5.1)
Sodium: 130 mmol/L — ABNORMAL LOW (ref 135–145)
Total Bilirubin: 0.5 mg/dL (ref 0.3–1.2)
Total Protein: 5.3 g/dL — ABNORMAL LOW (ref 6.5–8.1)

## 2021-10-12 LAB — GLUCOSE, CAPILLARY
Glucose-Capillary: 102 mg/dL — ABNORMAL HIGH (ref 70–99)
Glucose-Capillary: 105 mg/dL — ABNORMAL HIGH (ref 70–99)
Glucose-Capillary: 130 mg/dL — ABNORMAL HIGH (ref 70–99)
Glucose-Capillary: 178 mg/dL — ABNORMAL HIGH (ref 70–99)
Glucose-Capillary: 95 mg/dL (ref 70–99)
Glucose-Capillary: 97 mg/dL (ref 70–99)

## 2021-10-12 LAB — CBC WITH DIFFERENTIAL/PLATELET
Abs Immature Granulocytes: 0.12 10*3/uL — ABNORMAL HIGH (ref 0.00–0.07)
Basophils Absolute: 0.2 10*3/uL — ABNORMAL HIGH (ref 0.0–0.1)
Basophils Relative: 1 %
Eosinophils Absolute: 0.5 10*3/uL (ref 0.0–0.5)
Eosinophils Relative: 4 %
HCT: 25.6 % — ABNORMAL LOW (ref 36.0–46.0)
Hemoglobin: 8.7 g/dL — ABNORMAL LOW (ref 12.0–15.0)
Immature Granulocytes: 1 %
Lymphocytes Relative: 12 %
Lymphs Abs: 1.6 10*3/uL (ref 0.7–4.0)
MCH: 28.3 pg (ref 26.0–34.0)
MCHC: 34 g/dL (ref 30.0–36.0)
MCV: 83.4 fL (ref 80.0–100.0)
Monocytes Absolute: 0.7 10*3/uL (ref 0.1–1.0)
Monocytes Relative: 5 %
Neutro Abs: 10.3 10*3/uL — ABNORMAL HIGH (ref 1.7–7.7)
Neutrophils Relative %: 77 %
Platelets: 136 10*3/uL — ABNORMAL LOW (ref 150–400)
RBC: 3.07 MIL/uL — ABNORMAL LOW (ref 3.87–5.11)
RDW: 15.9 % — ABNORMAL HIGH (ref 11.5–15.5)
WBC: 13.4 10*3/uL — ABNORMAL HIGH (ref 4.0–10.5)
nRBC: 0 % (ref 0.0–0.2)

## 2021-10-12 LAB — BPAM RBC
Blood Product Expiration Date: 202306242359
ISSUE DATE / TIME: 202306041323
Unit Type and Rh: 6200

## 2021-10-12 LAB — MAGNESIUM: Magnesium: 1.9 mg/dL (ref 1.7–2.4)

## 2021-10-12 LAB — PHOSPHORUS: Phosphorus: 5.3 mg/dL — ABNORMAL HIGH (ref 2.5–4.6)

## 2021-10-12 MED ORDER — GERHARDT'S BUTT CREAM
TOPICAL_CREAM | CUTANEOUS | Status: DC | PRN
  Filled 2021-10-12: qty 1

## 2021-10-12 NOTE — Plan of Care (Signed)
  Problem: Education: Goal: Knowledge of General Education information will improve Description: Including pain rating scale, medication(s)/side effects and non-pharmacologic comfort measures Outcome: Progressing   Problem: Health Behavior/Discharge Planning: Goal: Ability to manage health-related needs will improve Outcome: Progressing   Problem: Clinical Measurements: Goal: Ability to maintain clinical measurements within normal limits will improve Outcome: Progressing Goal: Will remain free from infection Outcome: Progressing Goal: Diagnostic test results will improve Outcome: Progressing Goal: Respiratory complications will improve Outcome: Progressing Goal: Cardiovascular complication will be avoided Outcome: Progressing   Problem: Activity: Goal: Risk for activity intolerance will decrease Outcome: Progressing   Problem: Coping: Goal: Level of anxiety will decrease Outcome: Progressing   Problem: Elimination: Goal: Will not experience complications related to bowel motility Outcome: Progressing Goal: Will not experience complications related to urinary retention Outcome: Progressing   Problem: Safety: Goal: Ability to remain free from injury will improve Outcome: Progressing   Problem: Education: Goal: Ability to describe self-care measures that may prevent or decrease complications (Diabetes Survival Skills Education) will improve Outcome: Progressing Goal: Individualized Educational Video(s) Outcome: Progressing   Problem: Fluid Volume: Goal: Ability to maintain a balanced intake and output will improve Outcome: Progressing   Problem: Health Behavior/Discharge Planning: Goal: Ability to identify and utilize available resources and services will improve Outcome: Progressing Goal: Ability to manage health-related needs will improve Outcome: Progressing   Problem: Nutritional: Goal: Maintenance of adequate nutrition will improve Outcome:  Progressing Goal: Progress toward achieving an optimal weight will improve Outcome: Progressing   Problem: Tissue Perfusion: Goal: Adequacy of tissue perfusion will improve Outcome: Progressing   Problem: Skin Integrity: Goal: Risk for impaired skin integrity will decrease Outcome: Progressing

## 2021-10-12 NOTE — Progress Notes (Signed)
PROGRESS NOTE    Lindsey Murillo  YBW:[id number] DOB: 01/15/1996 DOA: 10/01/2021 PCP: Lindsey Spikes, DO  Chief Complaint  Patient presents with   Leg Swelling    X 2 weeks     Brief Narrative:  Lindsey Murillo is Lindsey Murillo 26 y.o. female with past medical history significant for is Lindsey Murillo 26 year old female with past medical history significant for type 1 diabetes mellitus, stage IV CKD with nephrotic syndrome, anemia of chronic medical/renal disease, diabetic neuropathy, HTN who initially presented to [facility] ED on 5/25 with complaints of not feeling well, lower extremity edema, and posterior neck pain with associated drainage.  Mother at bedside assists with HPI given patient's resistance/evasive to questions.  Her mother reports that patient has not been feeling well for the last 2 weeks and noted [name] area of the posterior left neck that started draining 2 days prior.  Patient/mother denied any trauma to that region.  Pain is worse with any type of range of motion of the neck, nothing seems to make it better.  Mother put some ointment on it which offered her no relief.   In the ED, temperature 98.2 F, HR 102, RR 17, BP 96/67, SPO2 100% on room air.  Sodium 130, potassium 2.7, chloride 107, CO2 14, glucose 179, BUN 66, creatinine 4.76.  BNP 626.0.  WBC 38.9, hemoglobin 8.6, platelet count 330.  Lactic acid 0.7, procalcitonin 1.85.  INR 1.2.  Urinalysis with trace leukocytes, negative nitrite, rare bacteria, 11-20 WBCs.  Pregnancy test negative.  Chest x-ray with cardiomegaly, no active cardiopulmonary disease process.  CT soft tissue neck with area of phlegmon/abscess measuring 4 x 2.4 x 4.8 cm localized upper left neck with enlarged left upper cervical chain lymph nodes.  Blood cultures x2 were drawn.  Patient was started on vancomycin, cefepime, Flagyl.  Initially admitted to [facility].  Valuated by general surgery, Dr. Arnoldo Morale and requested transfer to [facility] higher level of care due to multiple  comorbidities.  Case was discussed with ENT, Dr. Benjamine Mola; and location was out of scope of ENT and recommended transfer to [facility] for general surgery evaluation.  Patient was transferred to [facility] and admitted to the hospital service with general surgery in consultation.       Assessment & Plan:   Principal Problem:   Sepsis ([place]) Active Problems:   Cellulitis of right breast   Uncontrolled type 1 diabetes mellitus with hyperglycemia ([name])   Pyuria   AKI (acute kidney injury) ([place])   Nephrotic syndrome   Hyponatremia   Peripheral edema   Hypokalemia   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 chronic kidney disease ([name])   Protein-calorie malnutrition, severe ([name])   Physical deconditioning   Hypomagnesemia   Broken tooth   Neck abscess   CKD (chronic kidney disease) stage 4, GFR 15-29 ml/min ([name])   Assessment and Plan: * Sepsis ([place]) Ruled in for sepsis at admission Due to L neck abscess CT with 4x2.4x4.8 cm phlegmon (limited evaluation due to absence of contrast), enlarged L upper chain cervical chain LN S/p I&D 5/28, per surgery -> wet to dry dressing changes, santyl to wound Concern for R breast infection as well, no definite or focal fluid collection noted on [place] MSSA on 5/28 culture Blood cultures ngtd Vancomycin 5/25 Cefepime 5/25-5/28 linezolid 5/27 - 5/30 Ancef 5/30 - present Narrowed to ancef, continue for now (will plan for at least 14 days, then reeval) White count continuing to downtrend gradually  Cellulitis of right  breast US without definite or focal fluid collection Needs outpatient follow up   Pyuria UA with bacteria and yeast - no clear symptoms, suspect contaminant Follow culture only if symptomatic  Uncontrolled type 1 diabetes mellitus with hyperglycemia ([place]) Uncontrolled type I diabetic with Jayceon Troy last hemoglobin A1c of 9.8 Basal insulin appears it was d/c'd due to hypoglycemia?  Hold cautiously for now (she was just on D5 IVF and still wasn't requiring much  insulin) start with q4 SSI and follow closely - doesn't look like she's eating much  Hyponatremia Overloaded - improving with diuresis  Follow with diuresis  AKI (acute kidney injury) ([place]) Creatinine 5.96 today - relatively stable with diuresis today Baseline ~3 Renal US today without hydro Appears overloaded, LE edema appears to be improving gradually Renal consult, appreciate assistance - plan for access once infectious issues resolved Diuresis with albumin given edema/nephrotic syndrome  Peripheral edema Diuresis with albumin for nephrotic syndrome  Hypokalemia Resolved, follow  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 chronic kidney disease ([name]) Follow Elevated ferritin, low iron - normal folate, elevated b12 S/p 1 unit pRBC  Protein-calorie malnutrition, severe ([name]) noted  Physical deconditioning PT  Hypomagnesemia Replace and follow  Broken tooth Pain related to this Continue abx for now       DVT prophylaxis: heparin Code Status: full Family Communication: mom Disposition:   Status is: Inpatient Remains inpatient appropriate because: need for IV abx   Consultants:  Renal Surgery  Procedures:  I&D 5/28  Antimicrobials:  Anti-infectives (From admission, onward)    Start     Dose/Rate Route Frequency Ordered Stop   10/06/21 2200  ceFAZolin (ANCEF) IVPB 1 g/50 mL premix        1 g 100 mL/hr over 30 Minutes Intravenous Every 12 hours 10/06/21 1052     10/04/21 2200  ceFEPIme (MAXIPIME) 1 g in sodium chloride 0.9 % 100 mL IVPB  Status:  Discontinued        1 g 200 mL/hr over 30 Minutes Intravenous Every 24 hours 10/04/21 1011 10/05/21 1343   10/03/21 2200  vancomycin (VANCOREADY) IVPB 750 mg/150 mL  Status:  Discontinued        750 mg 150 mL/hr over 60 Minutes Intravenous Every 48 hours 10/02/21 0033 10/02/21 1144   10/03/21 1000  linezolid (ZYVOX) IVPB 600 mg  Status:  Discontinued        600 mg 300 mL/hr over 60 Minutes Intravenous Every 12 hours 10/02/21 1144  10/06/21 1052   10/02/21 2200  ceFEPIme (MAXIPIME) 2 g in sodium chloride 0.9 % 100 mL IVPB  Status:  Discontinued        2 g 200 mL/hr over 30 Minutes Intravenous Every 24 hours 10/02/21 0033 10/04/21 1011   10/02/21 0000  ceFEPIme (MAXIPIME) 2 g in sodium chloride 0.9 % 100 mL IVPB        2 g 200 mL/hr over 30 Minutes Intravenous  Once 10/01/21 2358 10/02/21 0246   10/02/21 0000  metroNIDAZOLE (FLAGYL) IVPB 500 mg        500 mg 100 mL/hr over 60 Minutes Intravenous  Once 10/01/21 2358 10/02/21 0246   10/02/21 0000  vancomycin (VANCOCIN) IVPB 1000 mg/200 mL premix        1,000 mg 200 mL/hr over 60 Minutes Intravenous  Once 10/01/21 2358 10/02/21 0244       Subjective: No new complaints  Objective: Vitals:   10/11/21 2024 10/12/21 0438 10/12/21 0628 10/12/21 0743  BP: (!) 149/98 (!) 157/100  Marland Kitchen)  145/93  Pulse: 87 84  86  Resp: 18 16  18   Temp: (!) 97.4 F (36.3 C) (!) 97.4 F (36.3 C)  (!) 97.5 F (36.4 C)  TempSrc: Oral Oral    SpO2: 99% 99%  99%  Weight:   75.1 kg   Height:        Intake/Output Summary (Last 24 hours) at 10/12/2021 1330 Last data filed at 10/12/2021 0800 Gross per 24 hour  Intake 680 ml  Output --  Net 680 ml    Filed Weights   10/08/21 0500 10/10/21 0500 10/12/21 0628  Weight: 74.1 kg 68.8 kg 75.1 kg    Examination:  General: No acute distress. Cardiovascular: RRR Lungs: unlabored Neurological: Alert and oriented 3. Moves all extremities 4 with equal strength. Cranial nerves II through XII grossly intact. Skin: stable neck wound Extremities: LE edema  Data Reviewed: I have personally reviewed following labs and imaging studies  CBC: Recent Labs  Lab 10/08/21 0158 10/09/21 0630 10/10/21 0534 10/11/21 0105 10/12/21 0336  WBC 26.9* 21.4* 14.3* 13.5* 13.4*  NEUTROABS 24.6* 18.8* 11.8* 10.8* 10.3*  HGB 7.1* 7.2* 7.1* 6.5* 8.7*  HCT 21.9* 21.7* 21.4* 19.6* 25.6*  MCV 86.6 84.4 84.3 84.8 83.4  PLT 238 208 173 147* 136*    Basic  Metabolic Panel: Recent Labs  Lab 10/08/21 0158 10/08/21 1657 10/09/21 0630 10/10/21 0534 10/11/21 0105 10/12/21 0336  NA 122* 123* 125* 127* 126* 130*  K 4.3 4.3 4.3 4.2 4.1 4.2  CL 94* 95* 95* 95* 95* 97*  CO2 16* 17* 17* 19* 18* 20*  GLUCOSE 97 93 90 113* 131* 94  BUN 63* 66* 66* 68* 68* 69*  CREATININE 5.54* 5.83* 6.00* 5.97* 5.91* 5.96*  CALCIUM 7.5* 7.4* 7.6* 7.7* 7.5* 8.1*  MG 1.5*  --  1.5* 1.7 1.8 1.9  PHOS 5.1*  --  5.3* 5.2* 5.2* 5.3*    GFR: Estimated Creatinine Clearance: 15 mL/min (Adaiah Jaskot) (by C-G formula based on SCr of 5.96 mg/dL (H)).  Liver Function Tests: Recent Labs  Lab 10/08/21 0158 10/09/21 0630 10/10/21 0534 10/11/21 0105 10/12/21 0336  AST 10* 11* 12* 11* 12*  ALT 11 6 <5 <5 <5  ALKPHOS 190* 164* 162* 151* 155*  BILITOT 0.3 0.6 0.6 0.5 0.5  PROT 4.7* 4.5* 4.6* 4.7* 5.3*  ALBUMIN 1.7* 1.8* 2.0* 2.3* 2.7*    CBG: Recent Labs  Lab 10/11/21 2040 10/12/21 0033 10/12/21 0441 10/12/21 0732 10/12/21 1137  GLUCAP 111* 97 95 102* 105*     Recent Results (from the past 240 hour(s))  Surgical pcr screen     Status: Abnormal   Collection Time: 10/03/21  1:00 PM   Specimen: Nasal Mucosa; Nasal Swab  Result Value Ref Range Status   MRSA, PCR NEGATIVE NEGATIVE Final   Staphylococcus aureus POSITIVE (Carlette [place]matier) NEGATIVE Final    Comment: (NOTE) The Xpert SA Assay (FDA approved for NASAL specimens in patients 69 years of age and older), is one component of Vikkie Goeden comprehensive surveillance program. It is not intended to diagnose infection nor to guide or monitor treatment. Performed at [facility], [place] [address], [place], [place] [address]   Aerobic/Anaerobic Culture w Gram Stain (surgical/deep wound)     Status: None   Collection Time: 10/04/21  8:44 AM   Specimen: Abscess  Result Value Ref Range Status   Specimen Description ABSCESS NECK  Final   Special Requests NONE  Final   Gram Stain   Final  FEW WBC PRESENT, PREDOMINANTLY PMN MODERATE  GRAM POSITIVE COCCI IN PAIRS IN CLUSTERS    Culture   Final    MODERATE STAPHYLOCOCCUS AUREUS NO ANAEROBES ISOLATED Performed at [facility], [place] [address] [place], [place] [address]    Report Status 10/09/2021 FINAL  Final   Organism ID, Bacteria STAPHYLOCOCCUS AUREUS  Final      Susceptibility   Staphylococcus aureus - MIC*    CIPROFLOXACIN <=0.5 SENSITIVE Sensitive     ERYTHROMYCIN <=0.25 SENSITIVE Sensitive     GENTAMICIN <=0.5 SENSITIVE Sensitive     OXACILLIN 0.5 SENSITIVE Sensitive     TETRACYCLINE <=1 SENSITIVE Sensitive     VANCOMYCIN 1 SENSITIVE Sensitive     TRIMETH/SULFA <=10 SENSITIVE Sensitive     CLINDAMYCIN <=0.25 SENSITIVE Sensitive     RIFAMPIN <=0.5 SENSITIVE Sensitive     Inducible Clindamycin NEGATIVE Sensitive     * MODERATE STAPHYLOCOCCUS AUREUS         Radiology Studies: No results found.      Scheduled Meds:  acetaminophen  650 mg Oral Q6H   calcium acetate  667 mg Oral TID WC   Chlorhexidine Gluconate Cloth  6 each Topical Daily   collagenase   Topical Daily   darbepoetin (ARANESP) injection - NON-DIALYSIS  60 mcg Subcutaneous Q Wed-1800   feeding supplement (NEPRO CARB STEADY)  237 mL Oral BID BM   heparin  5,000 Units Subcutaneous Q8H   insulin aspart  0-6 Units Subcutaneous Q4H   magnesium oxide  400 mg Oral Daily   rosuvastatin  10 mg Oral Daily   sodium bicarbonate  1,300 mg Oral TID   tamsulosin  0.4 mg Oral QPC supper   Continuous Infusions:  albumin human 25 g (10/12/21 0929)    ceFAZolin (ANCEF) IV 1 g (10/12/21 1034)   furosemide 120 mg (10/12/21 0919)     LOS: 10 days    Time spent: over 30 min    Fayrene Helper, MD [facility]   To contact the attending provider between 7A-7P or the covering provider during after hours 7P-7A, please log into the web site [email or web address] and access using universal [place] password for that web site. If you do not have the password, please call the hospital  operator.  10/12/2021, 1:30 PM

## 2021-10-12 NOTE — Progress Notes (Signed)
Patient ID: MEAH JIRON, female   DOB: 11/18/1995, 26 y.o.   MRN: [id number] S: no new complaints O:BP (!) 145/93 (BP Location: Right Arm)   Pulse 86   Temp (!) 97.5 F (36.4 C)   Resp 18   Ht 5' 7.01" (1.702 m)   Wt 75.1 kg   SpO2 99%   BMI 25.93 kg/m   Intake/Output Summary (Last 24 hours) at 10/12/2021 1206 Last data filed at 10/12/2021 0800 Gross per 24 hour  Intake 680 ml  Output --  Net 680 ml   Intake/Output: I/O last 3 completed shifts: In: 960 [P.O.:120; Blood:440; IV Piggyback:400] Out: 250 [Urine:250]  Intake/Output this shift:  Total I/O In: 240 [P.O.:240] Out: -  Weight change:  Gen: NAD CVS: RRR Resp:CTA Abd: +BS, soft, NT/ND Ext: 2+ pretibial edema bilaterally  Recent Labs  Lab 10/06/21 0148 10/07/21 0230 10/07/21 0937 10/08/21 0158 10/08/21 1657 10/09/21 0630 10/10/21 0534 10/11/21 0105 10/12/21 0336  NA 126* 121*  --  122* 123* 125* 127* 126* 130*  K 4.8 4.5  --  4.3 4.3 4.3 4.2 4.1 4.2  CL 100 96*  --  94* 95* 95* 95* 95* 97*  CO2 15* 16*  --  16* 17* 17* 19* 18* 20*  GLUCOSE 122* 103*  --  97 93 90 113* 131* 94  BUN 65* 63*  --  63* 66* 66* 68* 68* 69*  CREATININE 5.45* 5.41*  --  5.54* 5.83* 6.00* 5.97* 5.91* 5.96*  ALBUMIN <1.5* <1.5* <1.5* 1.7*  --  1.8* 2.0* 2.3* 2.7*  CALCIUM 7.8* 7.4*  --  7.5* 7.4* 7.6* 7.7* 7.5* 8.1*  PHOS 5.1* 5.0*  --  5.1*  --  5.3* 5.2* 5.2* 5.3*  AST  --   --   --  10*  --  11* 12* 11* 12*  ALT  --   --   --  11  --  6 <5 <5 <5   Liver Function Tests: Recent Labs  Lab 10/10/21 0534 10/11/21 0105 10/12/21 0336  AST 12* 11* 12*  ALT <5 <5 <5  ALKPHOS 162* 151* 155*  BILITOT 0.6 0.5 0.5  PROT 4.6* 4.7* 5.3*  ALBUMIN 2.0* 2.3* 2.7*   No results for input(s): LIPASE, AMYLASE in the last 168 hours. No results for input(s): AMMONIA in the last 168 hours. CBC: Recent Labs  Lab 10/08/21 0158 10/09/21 0630 10/10/21 0534 10/11/21 0105 10/12/21 0336  WBC 26.9* 21.4* 14.3* 13.5* 13.4*  NEUTROABS  24.6* 18.8* 11.8* 10.8* 10.3*  HGB 7.1* 7.2* 7.1* 6.5* 8.7*  HCT 21.9* 21.7* 21.4* 19.6* 25.6*  MCV 86.6 84.4 84.3 84.8 83.4  PLT 238 208 173 147* 136*   Cardiac Enzymes: No results for input(s): CKTOTAL, CKMB, CKMBINDEX, TROPONINI in the last 168 hours. CBG: Recent Labs  Lab 10/11/21 2040 10/12/21 0033 10/12/21 0441 10/12/21 0732 10/12/21 1137  GLUCAP 111* 97 95 102* 105*    Iron Studies: No results for input(s): IRON, TIBC, TRANSFERRIN, FERRITIN in the last 72 hours. Studies/Results: No results found.  acetaminophen  650 mg Oral Q6H   calcium acetate  667 mg Oral TID WC   Chlorhexidine Gluconate Cloth  6 each Topical Daily   collagenase   Topical Daily   darbepoetin (ARANESP) injection - NON-DIALYSIS  60 mcg Subcutaneous Q Wed-1800   feeding supplement (NEPRO CARB STEADY)  237 mL Oral BID BM   heparin  5,000 Units Subcutaneous Q8H   insulin aspart  0-6 Units Subcutaneous Q4H  magnesium oxide  400 mg Oral Daily   rosuvastatin  10 mg Oral Daily   sodium bicarbonate  1,300 mg Oral TID   tamsulosin  0.4 mg Oral QPC supper    BMET    Component Value Date/Time   NA 130 (L) 10/12/2021 0336   NA 132 (L) 09/24/2021 1453   K 4.2 10/12/2021 0336   CL 97 (L) 10/12/2021 0336   CO2 20 (L) 10/12/2021 0336   GLUCOSE 94 10/12/2021 0336   BUN 69 (H) 10/12/2021 0336   BUN 36 (H) 09/24/2021 1453   CREATININE 5.96 (H) 10/12/2021 0336   CREATININE 0.98 08/29/2019 1637   CALCIUM 8.1 (L) 10/12/2021 0336   GFRNONAA 9 (L) 10/12/2021 0336   GFRAA >60 10/27/2019 2021   CBC    Component Value Date/Time   WBC 13.4 (H) 10/12/2021 0336   RBC 3.07 (L) 10/12/2021 0336   HGB 8.7 (L) 10/12/2021 0336   HGB 9.5 (L) 09/24/2021 1453   HCT 25.6 (L) 10/12/2021 0336   HCT 28.1 (L) 09/24/2021 1453   PLT 136 (L) 10/12/2021 0336   PLT 248 09/24/2021 1453   MCV 83.4 10/12/2021 0336   MCV 83 09/24/2021 1453   MCH 28.3 10/12/2021 0336   MCHC 34.0 10/12/2021 0336   RDW 15.9 (H) 10/12/2021 0336    RDW 14.3 09/24/2021 1453   LYMPHSABS 1.6 10/12/2021 0336   MONOABS 0.7 10/12/2021 0336   EOSABS 0.5 10/12/2021 0336   BASOSABS 0.2 (H) 10/12/2021 0336     Assessment/Plan:  AKI/CKD stage IV - Presumably due to hemodynamic insults in setting of severe sepsis.  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  Scr appears to have reached a plateau.  No uremic symptoms, however I did discuss with her and her mother the likelihood of progressing to ESRD given her biopsy results.  Will need to plan for vascular access after her infectious issues have been resolved.   Avoid nephrotoxic medications including NSAIDs and iodinated intravenous contrast exposure unless the latter is absolutely indicated.   Preferred narcotic agents for pain control are hydromorphone, fentanyl, and methadone. Morphine should not be used.  Avoid Baclofen and avoid oral sodium phosphate and magnesium citrate based laxatives / bowel preps.  Continue strict Input and Output monitoring. Will monitor the patient closely with you and intervene or adjust therapy as indicated by changes in clinical status/labs  Severe sepsis due to posterior neck abscess - MSSA and currently receiving cefazolin, IV Right breast cellulitis - currently on antibiotics.  Reviewed by surgery and nothing to drain. Hypervolemic hyponatremia - slowly improving with diuresis.  No symptoms. Nephrotic syndrome with wedema - on lasix 120 mg bid with IV albumin to improve response. HTN - stable Anemia of CKD stage IV.  Started ESA, transfuse for Hgb <7. Uncontrolled DM type 1 - per primary Severe protein malnutrition - per primary.   Donetta Potts, MD [facility]

## 2021-10-12 NOTE — Progress Notes (Signed)
Nutrition Follow-up  DOCUMENTATION CODES:  Not applicable  INTERVENTION:  Continue Nepro Shake po BID, each supplement provides 425 kcal and 19 grams protein Liberalize diet to carb modified with 1500 mL fluid restriction as K and Na are low and renal diet does not limit phosphorus  NUTRITION DIAGNOSIS:  Increased nutrient needs related to  (sepsis) as evidenced by estimated needs. - remains applicable  GOAL:  Patient will meet greater than or equal to 90% of their needs - progressing, supplements in place  MONITOR:  PO intake, Supplement acceptance  REASON FOR ASSESSMENT:  Consult Assessment of nutrition requirement/status  ASSESSMENT:  Pt with PMH off type 1 DM, stage IV CKD with nephrotic syndrome, anemia of chronic disease, diabetic neuropathy, and HTN admitted with posterior neck abscess and severe sepsis.  5/28 - Op, I&D of posterior neck abscess   Pt and mother in room at the time of assessment. Reviewed intake since last visit and pt reports that appetite is fair. Lunch tray noted at bedside well consumed but average intake trends are <50%. Pt states she is drinking some of the Nepros. Noted that typically 1 is refused daily.  Discussed the importance of adequate nutrition intake and encouraged pt to  try and consume all three meals with some intake on nepro as well. Pt had questions about her renal diet, did discuss minerals being limited and what foods they were found in. However, K has not been high this admission. Will liberalize to carb modified diet and add fluid restriction per nephrology recommendations.   Average Meal Intake: 5/29-6/4: 38% intake x 3 recorded meals  Nutritionally Relevant Medications: Scheduled Meds:  calcium acetate  667 mg Oral TID WC   NEPRO CARB STEADY  237 mL Oral BID BM   insulin aspart  0-6 Units Subcutaneous Q4H   magnesium oxide  400 mg Oral Daily   rosuvastatin  10 mg Oral Daily   sodium bicarbonate  1,300 mg Oral TID    Continuous Infusions:  albumin human 25 g (10/12/21 0929)    ceFAZolin (ANCEF) IV 1 g (10/12/21 1034)   furosemide 120 mg (10/12/21 0919)   PRN Meds: ondansetron, polyethylene glycol  Labs Reviewed: Sodium 130, chloride 97 BUN 69, creatinine 5.96 Phosphorus 5.3 CBG ranges from 95-170 mg/dL over the last 24 hours HgbA1c 9.8% (5/18)  NUTRITION - FOCUSED PHYSICAL EXAM: Flowsheet Row Most Recent Value  Orbital Region No depletion  Upper Arm Region No depletion  Thoracic and Lumbar Region No depletion  Buccal Region No depletion  Temple Region No depletion  Clavicle Bone Region No depletion  Clavicle and Acromion Bone Region No depletion  Scapular Bone Region Unable to assess  Dorsal Hand No depletion  Patellar Region Moderate depletion  Anterior Thigh Region Moderate depletion  Posterior Calf Region Unable to assess  [edema]  Edema (RD Assessment) Moderate  Hair Reviewed  Eyes Reviewed  Mouth Reviewed  Skin Reviewed  [bleeding neck abscess, red scabbed R breast abscess, multiple areas of scabbed/discolored from previous sores]  Nails Reviewed   Diet Order:   Diet Order             Diet renal with fluid restriction Fluid restriction: 1200 mL Fluid; Room service appropriate? Yes; Fluid consistency: Thin  Diet effective now                   EDUCATION NEEDS:  Education needs have been addressed  Skin:  Skin Assessment: Reviewed RN Assessment (L neck abscess, R breast  scab)  Last BM:  6/5  Height:  Ht Readings from Last 1 Encounters:  10/04/21 5' 7.01" (1.702 m)    Weight:  Wt Readings from Last 1 Encounters:  10/12/21 75.1 kg    Ideal Body Weight:  67.3 kg  BMI:  Body mass index is 25.93 kg/m.  Estimated Nutritional Needs:  Kcal:  1900-2100 Protein:  100-115 grams Fluid:  >1.9 L/day    Ranell Patrick, RD, LDN Clinical Dietitian RD pager # available in AMION  After hours/weekend pager # available in [facility]

## 2021-10-12 NOTE — Progress Notes (Signed)
Mobility Specialist Progress Note:   10/12/21 1600  Mobility  Activity Ambulated with assistance to bathroom  Level of Assistance Moderate assist, patient does 50-74%  Assistive Device Front wheel walker  Distance Ambulated (ft) 30 ft  Activity Response Tolerated well  $Mobility charge 1 Mobility   Pt requesting to go to BR. Required minA to stand from recliner, modA from toilet. Up to contact guard assist required with gait. Pt in great spirits about improvement. Left in bed with all needs met.   Nelta Numbers Acute Rehab Secure Chat or Office Phone: [phone]

## 2021-10-12 NOTE — Progress Notes (Signed)
Physical Therapy Treatment Patient Details Name: Lindsey Murillo MRN: [id number] DOB: 03/20/1996 Today's Date: 10/12/2021   History of Present Illness Lindsey Murillo is a 26 y.o. female  initially presented to [facility] ED on 5/25 with complaints of not feeling well, lower extremity edema, and posterior neck pain with associated drainage.  Mother at bedside assists with HPI given patient's resistance/evasive to questions.  Her mother reports that patient has not been feeling well for the 2 weeks prior and noted an area of the posterior left neck that started draining 2 days prior to admit. Transferred to [facility] on 5/26 for possible surgery.  Pt underwent I&D left neck abscess on 5/28. Pt with severe sepsis.  Also with right breast abscess that is being watched.  PMH: type 1 diabetes mellitus, stage IV CKD with nephrotic syndrome, anemia of chronic medical/renal disease, diabetic neuropathy, HTN    PT Comments    Pt received supine and agreeable to session with continued progress towards acute goals. Pt able to come to standing x3 throughout session with RW and ambulate a few steps from EOB to recliner with +2 assist for RW management and for steadying. Pt demonstrating improvement in static standing balance with ability to maintain single UE support on RW while reaching outside BOS in all directions/planes with ea UE with minimal LOB and pt able to self correct in all instances.  Pt continues to benefit from skilled PT services to progress toward functional mobility goals.    Recommendations for follow up therapy are one component of a multi-disciplinary discharge planning process, led by the attending physician.  Recommendations may be updated based on patient status, additional functional criteria and insurance authorization.  Follow Up Recommendations  Acute inpatient rehab (3hours/day)     Assistance Recommended at Discharge Frequent or constant Supervision/Assistance  Patient can return  home with the following Two people to help with walking and/or transfers;A lot of help with bathing/dressing/bathroom;Assistance with cooking/housework;Direct supervision/assist for medications management;Direct supervision/assist for financial management;Assist for transportation;Help with stairs or ramp for entrance   Equipment Recommendations  Rolling walker (2 wheels);Other (comment) (stedy/mechanical lift pending progress)    Recommendations for Other Services Rehab consult     Precautions / Restrictions Precautions Precautions: Fall Restrictions Weight Bearing Restrictions: No     Mobility  Bed Mobility Overal bed mobility: Needs Assistance Bed Mobility: Supine to Sit     Supine to sit: Mod assist     General bed mobility comments: mod a to manage LEs, pt stating they felt "stuck" to bed    Transfers Overall transfer level: Needs assistance Equipment used: Rolling walker (2 wheels) Transfers: Sit to/from Stand, Bed to chair/wheelchair/BSC Sit to Stand: Max assist, Mod assist   Step pivot transfers: +2 physical assistance, +2 safety/equipment, Mod assist       General transfer comment: mod assist to come to standing from elevated EOB, max assist from recliner x2 trials. mod assist to take multiple steps from EOB to recliner and turn to sit for RW management and cues    Ambulation/Gait Ambulation/Gait assistance: Mod assist, +2 safety/equipment Gait Distance (Feet): 5 Feet Assistive device: Rolling walker (2 wheels) Gait Pattern/deviations: Step-through pattern, Decreased stride length, Trunk flexed Gait velocity: decr     General Gait Details: slow guarded steps from EOB to recliner, mod assist +2 for RW management, steadying assist and safety   Stairs             Wheelchair Mobility    Modified Rankin (  Stroke Patients Only)       Balance Overall balance assessment: Needs assistance Sitting-balance support: Single extremity supported, Bilateral  upper extremity supported, Feet supported Sitting balance-Leahy Scale: Poor Sitting balance - Comments: Pt was able to complete drying of BLE but noted close min guard due to decrease in control of movement in all planes. Postural control: Right lateral lean, Left lateral lean Standing balance support: Single extremity supported Standing balance-Leahy Scale: Poor Standing balance comment: MinA and biL UE support 2x >60 sec each.                            Cognition Arousal/Alertness: Awake/alert Behavior During Therapy: Flat affect                                   General Comments: Pt was able to follow simple one step command with increase in time. minimally verablly resposive but alert        Exercises Other Exercises Other Exercises: standing marching x20 (2 sets), standing and reaching outside BOS in all directions and planes x10 ea side    General Comments General comments (skin integrity, edema, etc.): grandmother present throughout session and particapatory and encouraging      Pertinent Vitals/Pain Pain Assessment Pain Assessment: Faces Faces Pain Scale: Hurts little more Pain Location: grimacing during session with mobility Pain Descriptors / Indicators: Grimacing Pain Intervention(s): Monitored during session, Limited activity within patient's tolerance    Home Living                          Prior Function            PT Goals (current goals can now be found in the care plan section) Acute Rehab PT Goals PT Goal Formulation: With patient/family Time For Goal Achievement: 10/19/21    Frequency    Min 3X/week      PT Plan      Co-evaluation              AM-PAC PT "6 Clicks" Mobility   Outcome Measure  Help needed turning from your back to your side while in a flat bed without using bedrails?: A Little Help needed moving from lying on your back to sitting on the side of a flat bed without using bedrails?:  A Lot Help needed moving to and from a bed to a chair (including a wheelchair)?: A Lot Help needed standing up from a chair using your arms (e.g., wheelchair or bedside chair)?: A Lot Help needed to walk in hospital room?: A Lot Help needed climbing 3-5 steps with a railing? : Total 6 Click Score: 12    End of Session Equipment Utilized During Treatment: Gait belt Activity Tolerance: Patient tolerated treatment well Patient left: in chair;with call bell/phone within reach;with family/visitor present Nurse Communication: Mobility status PT Visit Diagnosis: Unsteadiness on feet (R26.81);Muscle weakness (generalized) (M62.81);Pain;Difficulty in walking, not elsewhere classified (R26.2) Pain - part of body:  (neck)     Time: 1340-1404 PT Time Calculation (min) (ACUTE ONLY): 24 min  Charges:  $Gait Training: 8-22 mins $Therapeutic Exercise: 8-22 mins                    Kashae Carstens R. PTA Acute Rehabilitation Services Office: [place] 10/12/2021, 2:16 PM

## 2021-10-12 NOTE — Progress Notes (Signed)
8 Days Post-Op  Subjective: CC: Pain improved over the left neck. No other complaints. Received 1U PRBC yesterday.   Objective: Vital signs in last 24 hours: Temp:  [97.3 F (36.3 C)-97.6 F (36.4 C)] 97.5 F (36.4 C) (06/05 0743) Pulse Rate:  [84-97] 86 (06/05 0743) Resp:  [15-18] 18 (06/05 0743) BP: (114-159)/(79-100) 145/93 (06/05 0743) SpO2:  [98 %-100 %] 99 % (06/05 0743) Weight:  [75.1 kg] 75.1 kg (06/05 0628) Last BM Date : 10/10/21  Intake/Output from previous day: 06/04 0701 - 06/05 0700 In: 960 [P.O.:120; Blood:440; IV Piggyback:400] Out: -  Intake/Output this shift: Total I/O In: 240 [P.O.:240] Out: -   PE: Gen:  Alert, NAD, pleasant Neck: L posterior neck with 2.5 x 2 cm open wound with 1cm undermining circumferentially. No drainage. Mixed fibrinous and granulation tissue at the base. No cellulitis.  R breast: Please see picture below.     Lab Results:  Recent Labs    10/11/21 0105 10/12/21 0336  WBC 13.5* 13.4*  HGB 6.5* 8.7*  HCT 19.6* 25.6*  PLT 147* 136*   BMET Recent Labs    10/11/21 0105 10/12/21 0336  NA 126* 130*  K 4.1 4.2  CL 95* 97*  CO2 18* 20*  GLUCOSE 131* 94  BUN 68* 69*  CREATININE 5.91* 5.96*  CALCIUM 7.5* 8.1*   PT/INR No results for input(s): LABPROT, INR in the last 72 hours. CMP     Component Value Date/Time   NA 130 (L) 10/12/2021 0336   NA 132 (L) 09/24/2021 1453   K 4.2 10/12/2021 0336   CL 97 (L) 10/12/2021 0336   CO2 20 (L) 10/12/2021 0336   GLUCOSE 94 10/12/2021 0336   BUN 69 (H) 10/12/2021 0336   BUN 36 (H) 09/24/2021 1453   CREATININE 5.96 (H) 10/12/2021 0336   CREATININE 0.98 08/29/2019 1637   CALCIUM 8.1 (L) 10/12/2021 0336   PROT 5.3 (L) 10/12/2021 0336   PROT 5.5 (L) 09/24/2021 1453   ALBUMIN 2.7 (L) 10/12/2021 0336   ALBUMIN 2.1 (L) 09/24/2021 1453   AST 12 (L) 10/12/2021 0336   ALT <5 10/12/2021 0336   ALKPHOS 155 (H) 10/12/2021 0336   BILITOT 0.5 10/12/2021 0336   BILITOT <0.2  09/24/2021 1453   GFRNONAA 9 (L) 10/12/2021 0336   GFRAA >60 10/27/2019 2021   Lipase     Component Value Date/Time   LIPASE 34 04/12/2017 1014    Studies/Results: No results found.  Anti-infectives: Anti-infectives (From admission, onward)    Start     Dose/Rate Route Frequency Ordered Stop   10/06/21 2200  ceFAZolin (ANCEF) IVPB 1 g/50 mL premix        1 g 100 mL/hr over 30 Minutes Intravenous Every 12 hours 10/06/21 1052     10/04/21 2200  ceFEPIme (MAXIPIME) 1 g in sodium chloride 0.9 % 100 mL IVPB  Status:  Discontinued        1 g 200 mL/hr over 30 Minutes Intravenous Every 24 hours 10/04/21 1011 10/05/21 1343   10/03/21 2200  vancomycin (VANCOREADY) IVPB 750 mg/150 mL  Status:  Discontinued        750 mg 150 mL/hr over 60 Minutes Intravenous Every 48 hours 10/02/21 0033 10/02/21 1144   10/03/21 1000  linezolid (ZYVOX) IVPB 600 mg  Status:  Discontinued        600 mg 300 mL/hr over 60 Minutes Intravenous Every 12 hours 10/02/21 1144 10/06/21 1052   10/02/21 2200  ceFEPIme (  MAXIPIME) 2 g in sodium chloride 0.9 % 100 mL IVPB  Status:  Discontinued        2 g 200 mL/hr over 30 Minutes Intravenous Every 24 hours 10/02/21 0033 10/04/21 1011   10/02/21 0000  ceFEPIme (MAXIPIME) 2 g in sodium chloride 0.9 % 100 mL IVPB        2 g 200 mL/hr over 30 Minutes Intravenous  Once 10/01/21 2358 10/02/21 0246   10/02/21 0000  metroNIDAZOLE (FLAGYL) IVPB 500 mg        500 mg 100 mL/hr over 60 Minutes Intravenous  Once 10/01/21 2358 10/02/21 0246   10/02/21 0000  vancomycin (VANCOCIN) IVPB 1000 mg/200 mL premix        1,000 mg 200 mL/hr over 60 Minutes Intravenous  Once 10/01/21 2358 10/02/21 0244        Assessment/Plan POD 8 s/p I&D of posterior neck abscess by Dr. Rosendo Gros on 5/28 - Continue santyl at the base w/ BID wet to dry dressing to small posterior left neck wound - Cx with MSSA - narrowed to Ancef. Afebrile. WBC overall improving. Would do 10-14d abx from our  standpoint - No further debridement planned at this time - Mother to learn wtd dressings. RN to assist with teaching  Chronic R breast changes - [place] with no acute drainable collection - Discussed w/ MD. No acute intervention planned but will set up to see a surgeon in the office and refer to the breast clinic.    Our team will sign off at this time. Please call back with any questions or concerns.    LOS: 10 days    Jillyn Ledger , [facility] 10/12/2021, 11:13 AM Please see Amion for pager number during day hours 7:00am-4:30pm

## 2021-10-13 ENCOUNTER — Inpatient Hospital Stay (HOSPITAL_COMMUNITY): Payer: 59

## 2021-10-13 DIAGNOSIS — R6520 Severe sepsis without septic shock: Secondary | ICD-10-CM | POA: Diagnosis not present

## 2021-10-13 DIAGNOSIS — A419 Sepsis, unspecified organism: Secondary | ICD-10-CM | POA: Diagnosis not present

## 2021-10-13 DIAGNOSIS — N179 Acute kidney failure, unspecified: Secondary | ICD-10-CM | POA: Diagnosis not present

## 2021-10-13 HISTORY — PX: IR US GUIDE VASC ACCESS RIGHT: IMG2390

## 2021-10-13 HISTORY — PX: IR FLUORO GUIDE CV LINE RIGHT: IMG2283

## 2021-10-13 LAB — CBC
HCT: 22.2 % — ABNORMAL LOW (ref 36.0–46.0)
Hemoglobin: 7.2 g/dL — ABNORMAL LOW (ref 12.0–15.0)
MCH: 27.8 pg (ref 26.0–34.0)
MCHC: 32.4 g/dL (ref 30.0–36.0)
MCV: 85.7 fL (ref 80.0–100.0)
Platelets: 101 10*3/uL — ABNORMAL LOW (ref 150–400)
RBC: 2.59 MIL/uL — ABNORMAL LOW (ref 3.87–5.11)
RDW: 16 % — ABNORMAL HIGH (ref 11.5–15.5)
WBC: 13.4 10*3/uL — ABNORMAL HIGH (ref 4.0–10.5)
nRBC: 0 % (ref 0.0–0.2)

## 2021-10-13 LAB — RENAL FUNCTION PANEL
Albumin: 2.7 g/dL — ABNORMAL LOW (ref 3.5–5.0)
Anion gap: 11 (ref 5–15)
BUN: 70 mg/dL — ABNORMAL HIGH (ref 6–20)
CO2: 20 mmol/L — ABNORMAL LOW (ref 22–32)
Calcium: 8 mg/dL — ABNORMAL LOW (ref 8.9–10.3)
Chloride: 100 mmol/L (ref 98–111)
Creatinine, Ser: 6.13 mg/dL — ABNORMAL HIGH (ref 0.44–1.00)
GFR, Estimated: 9 mL/min — ABNORMAL LOW (ref >60)
Glucose, Bld: 121 mg/dL — ABNORMAL HIGH (ref 70–99)
Phosphorus: 5.2 mg/dL — ABNORMAL HIGH (ref 2.5–4.6)
Potassium: 3.8 mmol/L (ref 3.5–5.1)
Sodium: 131 mmol/L — ABNORMAL LOW (ref 135–145)

## 2021-10-13 LAB — GLUCOSE, CAPILLARY
Glucose-Capillary: 104 mg/dL — ABNORMAL HIGH (ref 70–99)
Glucose-Capillary: 113 mg/dL — ABNORMAL HIGH (ref 70–99)
Glucose-Capillary: 117 mg/dL — ABNORMAL HIGH (ref 70–99)
Glucose-Capillary: 117 mg/dL — ABNORMAL HIGH (ref 70–99)
Glucose-Capillary: 130 mg/dL — ABNORMAL HIGH (ref 70–99)
Glucose-Capillary: 158 mg/dL — ABNORMAL HIGH (ref 70–99)
Glucose-Capillary: 180 mg/dL — ABNORMAL HIGH (ref 70–99)

## 2021-10-13 LAB — HEPATITIS B SURFACE ANTIGEN: Hepatitis B Surface Ag: NONREACTIVE

## 2021-10-13 LAB — HEPATITIS B SURFACE ANTIBODY,QUALITATIVE: Hep B S Ab: NONREACTIVE

## 2021-10-13 MED ORDER — LIDOCAINE-EPINEPHRINE 1 %-1:100000 IJ SOLN
INTRAMUSCULAR | Status: AC
  Administered 2021-10-13: 10 mL
  Filled 2021-10-13: qty 1

## 2021-10-13 MED ORDER — LIDOCAINE HCL (PF) 1 % IJ SOLN
INTRAMUSCULAR | Status: AC | PRN
  Administered 2021-10-13: 10 mL

## 2021-10-13 MED ORDER — DOXYCYCLINE HYCLATE 100 MG PO TABS
100.0000 mg | ORAL_TABLET | Freq: Two times a day (BID) | ORAL | Status: DC
  Administered 2021-10-13 – 2021-10-22 (×18): 100 mg via ORAL
  Filled 2021-10-13 (×18): qty 1

## 2021-10-13 MED ORDER — HEPARIN SODIUM (PORCINE) 1000 UNIT/ML IJ SOLN
INTRAMUSCULAR | Status: AC
  Administered 2021-10-13: 2.4 mL
  Filled 2021-10-13: qty 10

## 2021-10-13 MED ORDER — ALBUMIN HUMAN 25 % IV SOLN
25.0000 g | Freq: Four times a day (QID) | INTRAVENOUS | Status: AC
  Administered 2021-10-13: 25 g via INTRAVENOUS
  Filled 2021-10-13: qty 100

## 2021-10-13 MED ORDER — CHLORHEXIDINE GLUCONATE CLOTH 2 % EX PADS
6.0000 | MEDICATED_PAD | Freq: Every day | CUTANEOUS | Status: DC
  Administered 2021-10-14 – 2021-10-22 (×9): 6 via TOPICAL

## 2021-10-13 NOTE — Plan of Care (Signed)
  Problem: Clinical Measurements: Goal: Will remain free from infection Outcome: Progressing   Problem: Clinical Measurements: Goal: Diagnostic test results will improve Outcome: Progressing   Problem: Activity: Goal: Risk for activity intolerance will decrease Outcome: Progressing   

## 2021-10-13 NOTE — Progress Notes (Signed)
Patient ID: Lindsey Murillo, female   DOB: [birth year], 26 y.o.   MRN: [id number] S: No complaints and no events overnight.  No UOP documented but she reports that she has been urinating regularly. O:BP (!) 154/99 (BP Location: Left Arm)   Pulse 83   Temp (!) 97.4 F (36.3 C) (Oral)   Resp 16   Ht 5' 7.01" (1.702 m)   Wt 75.1 kg   SpO2 99%   BMI 25.93 kg/m   Intake/Output Summary (Last 24 hours) at 10/13/2021 1228 Last data filed at 10/13/2021 0700 Gross per 24 hour  Intake 890.95 ml  Output --  Net 890.95 ml   Intake/Output: I/O last 3 completed shifts: In: 1436.6 [P.O.:720; IV Piggyback:716.6] Out: -   Intake/Output this shift:  No intake/output data recorded. Weight change:  YQM:VHQIONGEXBMW, significant edema, NAD CVS: RRR Resp:CTA Abd: +BS, soft, NT/ND Ext:2-3+ edema bilateral lower extremities  Recent Labs  Lab 10/07/21 0230 10/07/21 0937 10/08/21 0158 10/08/21 1657 10/09/21 0630 10/10/21 0534 10/11/21 0105 10/12/21 0336 10/13/21 0154  NA 121*  --  122* 123* 125* 127* 126* 130* 131*  K 4.5  --  4.3 4.3 4.3 4.2 4.1 4.2 3.8  CL 96*  --  94* 95* 95* 95* 95* 97* 100  CO2 16*  --  16* 17* 17* 19* 18* 20* 20*  GLUCOSE 103*  --  97 93 90 113* 131* 94 121*  BUN 63*  --  63* 66* 66* 68* 68* 69* 70*  CREATININE 5.41*  --  5.54* 5.83* 6.00* 5.97* 5.91* 5.96* 6.13*  ALBUMIN <1.5* <1.5* 1.7*  --  1.8* 2.0* 2.3* 2.7* 2.7*  CALCIUM 7.4*  --  7.5* 7.4* 7.6* 7.7* 7.5* 8.1* 8.0*  PHOS 5.0*  --  5.1*  --  5.3* 5.2* 5.2* 5.3* 5.2*  AST  --   --  10*  --  11* 12* 11* 12*  --   ALT  --   --  11  --  6 <5 <5 <5  --    Liver Function Tests: Recent Labs  Lab 10/10/21 0534 10/11/21 0105 10/12/21 0336 10/13/21 0154  AST 12* 11* 12*  --   ALT <5 <5 <5  --   ALKPHOS 162* 151* 155*  --   BILITOT 0.6 0.5 0.5  --   PROT 4.6* 4.7* 5.3*  --   ALBUMIN 2.0* 2.3* 2.7* 2.7*   No results for input(s): LIPASE, AMYLASE in the last 168 hours. No results for input(s): AMMONIA in the  last 168 hours. CBC: Recent Labs  Lab 10/08/21 0158 10/09/21 0630 10/10/21 0534 10/11/21 0105 10/12/21 0336  WBC 26.9* 21.4* 14.3* 13.5* 13.4*  NEUTROABS 24.6* 18.8* 11.8* 10.8* 10.3*  HGB 7.1* 7.2* 7.1* 6.5* 8.7*  HCT 21.9* 21.7* 21.4* 19.6* 25.6*  MCV 86.6 84.4 84.3 84.8 83.4  PLT 238 208 173 147* 136*   Cardiac Enzymes: No results for input(s): CKTOTAL, CKMB, CKMBINDEX, TROPONINI in the last 168 hours. CBG: Recent Labs  Lab 10/12/21 2052 10/13/21 0025 10/13/21 0530 10/13/21 0805 10/13/21 1216  GLUCAP 130* 117* 117* 104* 113*    Iron Studies: No results for input(s): IRON, TIBC, TRANSFERRIN, FERRITIN in the last 72 hours. Studies/Results: No results found.  acetaminophen  650 mg Oral Q6H   calcium acetate  667 mg Oral TID WC   Chlorhexidine Gluconate Cloth  6 each Topical Daily   collagenase   Topical Daily   darbepoetin (ARANESP) injection - NON-DIALYSIS  60 mcg Subcutaneous Q  Wed-1800   feeding supplement (NEPRO CARB STEADY)  237 mL Oral BID BM   heparin  5,000 Units Subcutaneous Q8H   insulin aspart  0-6 Units Subcutaneous Q4H   magnesium oxide  400 mg Oral Daily   rosuvastatin  10 mg Oral Daily   sodium bicarbonate  1,300 mg Oral TID   tamsulosin  0.4 mg Oral QPC supper    BMET    Component Value Date/Time   NA 131 (L) 10/13/2021 0154   NA 132 (L) 09/24/2021 1453   K 3.8 10/13/2021 0154   CL 100 10/13/2021 0154   CO2 20 (L) 10/13/2021 0154   GLUCOSE 121 (H) 10/13/2021 0154   BUN 70 (H) 10/13/2021 0154   BUN 36 (H) 09/24/2021 1453   CREATININE 6.13 (H) 10/13/2021 0154   CREATININE 0.98 08/29/2019 1637   CALCIUM 8.0 (L) 10/13/2021 0154   GFRNONAA 9 (L) 10/13/2021 0154   GFRAA >60 10/27/2019 2021   CBC    Component Value Date/Time   WBC 13.4 (H) 10/12/2021 0336   RBC 3.07 (L) 10/12/2021 0336   HGB 8.7 (L) 10/12/2021 0336   HGB 9.5 (L) 09/24/2021 1453   HCT 25.6 (L) 10/12/2021 0336   HCT 28.1 (L) 09/24/2021 1453   PLT 136 (L) 10/12/2021 0336    PLT 248 09/24/2021 1453   MCV 83.4 10/12/2021 0336   MCV 83 09/24/2021 1453   MCH 28.3 10/12/2021 0336   MCHC 34.0 10/12/2021 0336   RDW 15.9 (H) 10/12/2021 0336   RDW 14.3 09/24/2021 1453   LYMPHSABS 1.6 10/12/2021 0336   MONOABS 0.7 10/12/2021 0336   EOSABS 0.5 10/12/2021 0336   BASOSABS 0.2 (H) 10/12/2021 0336    Assessment/Plan:   AKI/CKD stage IV - Presumably due to hemodynamic insults in setting of severe sepsis.  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  BUN/Scr continue to rise and still with significant edema.  No uremic symptoms, however I did discuss with her and her mother the need to initiate HD to help with her volume status.  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  Will need to plan for permanent vascular access after her infectious issues have been resolved.  Will consult IR for temp HD catheter placement and initiate HD afterwards. Avoid nephrotoxic medications including NSAIDs and iodinated intravenous contrast exposure unless the latter is absolutely indicated.   Preferred narcotic agents for pain control are hydromorphone, fentanyl, and methadone. Morphine should not be used.  Avoid Baclofen and avoid oral sodium phosphate and magnesium citrate based laxatives / bowel preps.  Continue strict Input and Output monitoring. Will monitor the patient closely with you and intervene or adjust therapy as indicated by changes in clinical status/labs  Severe sepsis due to posterior neck abscess - MSSA and currently receiving cefazolin, IV Right breast cellulitis - currently on antibiotics.  Reviewed by surgery and nothing to drain. Hypervolemic hyponatremia - slowly improving with diuresis.  No symptoms. Nephrotic syndrome with wedema - on lasix 120 mg bid with IV albumin to improve response. HTN - stable Anemia of CKD stage IV.  Started ESA, transfuse for Hgb  <7. Uncontrolled DM type 1 - per primary Severe protein malnutrition - per primary.   Donetta Potts, MD [facility]

## 2021-10-13 NOTE — Procedures (Signed)
Interventional Radiology Procedure Note  Procedure: Image guided temporary hemodialysis catheter placement  Findings: Please refer to procedural dictation for full description. 16 cm right IJ Trialysis catheter placed  Complications: None immediate  Estimated Blood Loss: < 5 mL  Recommendations: Catheter ready for immediate use.   Ruthann Cancer, MD

## 2021-10-13 NOTE — Progress Notes (Signed)
PROGRESS NOTE    Lindsey Murillo  HUO:[id number] DOB: 05/27/1995 DOA: 10/01/2021 PCP: Coral Spikes, DO  Chief Complaint  Patient presents with   Leg Swelling    X 2 weeks     Brief Narrative:  Lindsey Murillo is Lindsey Murillo 26 y.o. female with past medical history significant for is Lindsey Murillo 26 year old female with past medical history significant for type 1 diabetes mellitus, stage IV CKD with nephrotic syndrome, anemia of chronic medical/renal disease, diabetic neuropathy, HTN who initially presented to [facility] ED on 5/25 with complaints of not feeling well, lower extremity edema, and posterior neck pain with associated drainage.  Mother at bedside assists with HPI given patient's resistance/evasive to questions.  Her mother reports that patient has not been feeling well for the last 2 weeks and noted Lindsey Murillo area of the posterior left neck that started draining 2 days prior.  Patient/mother denied any trauma to that region.  Pain is worse with any type of range of motion of the neck, nothing seems to make it better.  Mother put some ointment on it which offered her no relief.   In the ED, temperature 98.2 F, HR 102, RR 17, BP 96/67, SPO2 100% on room air.  Sodium 130, potassium 2.7, chloride 107, CO2 14, glucose 179, BUN 66, creatinine 4.76.  BNP 626.0.  WBC 38.9, hemoglobin 8.6, platelet count 330.  Lactic acid 0.7, procalcitonin 1.85.  INR 1.2.  Urinalysis with trace leukocytes, negative nitrite, rare bacteria, 11-20 WBCs.  Pregnancy test negative.  Chest x-ray with cardiomegaly, no active cardiopulmonary disease process.  CT soft tissue neck with area of phlegmon/abscess measuring 4 x 2.4 x 4.8 cm localized upper left neck with enlarged left upper cervical chain lymph nodes.  Blood cultures x2 were drawn.  Patient was started on vancomycin, cefepime, Flagyl.  Initially admitted to [facility].  Valuated by general surgery, Dr. Arnoldo Morale and requested transfer to [facility] higher level of care due to multiple  comorbidities.  Case was discussed with ENT, Dr. Benjamine Mola; and location was out of scope of ENT and recommended transfer to [facility] for general surgery evaluation.  Patient was transferred to [facility] and admitted to the hospital service with general surgery in consultation.   She's now s/p I&D of L side of neck.  Surgery signed off, plan for 10-14 days abx.  R breast without evidence of abscess, plan for outpatient follow up, 10 days doxycycline.  Renal following for aki on CKD, tunneled HD cath placed today.      Assessment & Plan:   Principal Problem:   Sepsis ([place]) Active Problems:   Cellulitis of right breast   Uncontrolled type 1 diabetes mellitus with hyperglycemia ([name])   Pyuria   AKI (acute kidney injury) ([place])   Nephrotic syndrome   Hyponatremia   Peripheral edema   Hypokalemia   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 chronic kidney disease ([name])   Protein-calorie malnutrition, severe ([name])   Physical deconditioning   Hypomagnesemia   Broken tooth   Neck abscess   CKD (chronic kidney disease) stage 4, GFR 15-29 ml/min ([name])   Assessment and Plan: * Sepsis ([place]) Ruled in for sepsis at admission Due to L neck abscess CT with 4x2.4x4.8 cm phlegmon (limited evaluation due to absence of contrast), enlarged L upper chain cervical chain LN S/p I&D 5/28, per surgery -> wet to dry dressing changes BID, santyl to wound base Concern for R breast infection as well, no definite or focal fluid  collection noted on [place] MSSA on 5/28 culture Blood cultures ngtd Vancomycin 5/25 Cefepime 5/25-5/28 linezolid 5/27 - 5/30 Ancef 5/30 - present Narrowed to ancef, continue for now (will plan for at least 14 days, then reeval) Surgery recommending 10 days abx (doxycycline) for R breast White count continuing to downtrend gradually  Cellulitis of right breast US without definite or focal fluid collection Needs outpatient follow up, this has been arranged by surgery -> follow up with surgery and at breast  clinic Surgery recommending 10 days doxy  Pyuria UA with bacteria and yeast - no clear symptoms, suspect contaminant Follow culture only if symptomatic  Uncontrolled type 1 diabetes mellitus with hyperglycemia ([place]) Uncontrolled type I diabetic with Lindsey Murillo last hemoglobin A1c of 9.8 Basal insulin appears it was d/c'd due to hypoglycemia?  Hold cautiously for now (she was just on D5 IVF and still wasn't requiring much insulin)   Hyponatremia Overloaded - improving with diuresis  Follow with diuresis  AKI (acute kidney injury) ([place]) Creatinine 6.13 today - relatively stable with diuresis today Baseline ~3 Renal US today without hydro Appears overloaded, LE edema appears to be improving gradually Renal consult, appreciate assistance - temp HD cath placed today Diuresis with albumin given edema/nephrotic syndrome  Peripheral edema Diuresis with albumin for nephrotic syndrome  Hypokalemia Resolved, follow  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 chronic kidney disease ([name]) Follow Elevated ferritin, low iron - normal folate, elevated b12 S/p 1 unit pRBC  Protein-calorie malnutrition, severe ([name]) noted  Physical deconditioning PT  Hypomagnesemia Replace and follow  Broken tooth Upper right Pain related to this Continue abx for now  Needs dental f/u outpatient      DVT prophylaxis: heparin Code Status: full Family Communication: mom Disposition:   Status is: Inpatient Remains inpatient appropriate because: need for IV abx   Consultants:  Renal Surgery  Procedures:  I&D 5/28  Antimicrobials:  Anti-infectives (From admission, onward)    Start     Dose/Rate Route Frequency Ordered Stop   10/13/21 2200  doxycycline (VIBRA-TABS) tablet 100 mg        100 mg Oral Every 12 hours 10/13/21 1719 10/23/21 2159   10/06/21 2200  ceFAZolin (ANCEF) IVPB 1 g/50 mL premix        1 g 100 mL/hr over 30 Minutes Intravenous Every 12 hours 10/06/21 1052     10/04/21 2200  ceFEPIme (MAXIPIME) 1  g in sodium chloride 0.9 % 100 mL IVPB  Status:  Discontinued        1 g 200 mL/hr over 30 Minutes Intravenous Every 24 hours 10/04/21 1011 10/05/21 1343   10/03/21 2200  vancomycin (VANCOREADY) IVPB 750 mg/150 mL  Status:  Discontinued        750 mg 150 mL/hr over 60 Minutes Intravenous Every 48 hours 10/02/21 0033 10/02/21 1144   10/03/21 1000  linezolid (ZYVOX) IVPB 600 mg  Status:  Discontinued        600 mg 300 mL/hr over 60 Minutes Intravenous Every 12 hours 10/02/21 1144 10/06/21 1052   10/02/21 2200  ceFEPIme (MAXIPIME) 2 g in sodium chloride 0.9 % 100 mL IVPB  Status:  Discontinued        2 g 200 mL/hr over 30 Minutes Intravenous Every 24 hours 10/02/21 0033 10/04/21 1011   10/02/21 0000  ceFEPIme (MAXIPIME) 2 g in sodium chloride 0.9 % 100 mL IVPB        2 g 200 mL/hr over 30 Minutes Intravenous  Once 10/01/21 2358 10/02/21 0246  10/02/21 0000  metroNIDAZOLE (FLAGYL) IVPB 500 mg        500 mg 100 mL/hr over 60 Minutes Intravenous  Once 10/01/21 2358 10/02/21 0246   10/02/21 0000  vancomycin (VANCOCIN) IVPB 1000 mg/200 mL premix        1,000 mg 200 mL/hr over 60 Minutes Intravenous  Once 10/01/21 2358 10/02/21 0244       Subjective: Down with discussion of dialysis   Objective: Vitals:   10/12/21 2051 10/13/21 0535 10/13/21 0804 10/13/21 1720  BP: (!) 128/96 (!) 146/100 (!) 154/99 (!) 141/87  Pulse: 91 87 83 89  Resp: 18 17 16 16   Temp: 98.8 F (37.1 C) 99 F (37.2 C) (!) 97.4 F (36.3 C)   TempSrc:  Oral Oral   SpO2: 95% 99% 99% 98%  Weight:      Height:        Intake/Output Summary (Last 24 hours) at 10/13/2021 1727 Last data filed at 10/13/2021 1459 Gross per 24 hour  Intake 352 ml  Output 525 ml  Net -173 ml    Filed Weights   10/08/21 0500 10/10/21 0500 10/12/21 0628  Weight: 74.1 kg 68.8 kg 75.1 kg    Examination:  General: No acute distress. Stable L neck wound Cardiovascular: RRR Lungs: unlabored Abdomen: Soft, nontender, nondistended   Neurological: Alert and oriented 3. Moves all extremities 4 with equal strength. Cranial nerves II through XII grossly intact. Extremities: LE edema   Data Reviewed: I have personally reviewed following labs and imaging studies  CBC: Recent Labs  Lab 10/08/21 0158 10/09/21 0630 10/10/21 0534 10/11/21 0105 10/12/21 0336  WBC 26.9* 21.4* 14.3* 13.5* 13.4*  NEUTROABS 24.6* 18.8* 11.8* 10.8* 10.3*  HGB 7.1* 7.2* 7.1* 6.5* 8.7*  HCT 21.9* 21.7* 21.4* 19.6* 25.6*  MCV 86.6 84.4 84.3 84.8 83.4  PLT 238 208 173 147* 136*    Basic Metabolic Panel: Recent Labs  Lab 10/08/21 0158 10/08/21 1657 10/09/21 0630 10/10/21 0534 10/11/21 0105 10/12/21 0336 10/13/21 0154  NA 122*   < > 125* 127* 126* 130* 131*  K 4.3   < > 4.3 4.2 4.1 4.2 3.8  CL 94*   < > 95* 95* 95* 97* 100  CO2 16*   < > 17* 19* 18* 20* 20*  GLUCOSE 97   < > 90 113* 131* 94 121*  BUN 63*   < > 66* 68* 68* 69* 70*  CREATININE 5.54*   < > 6.00* 5.97* 5.91* 5.96* 6.13*  CALCIUM 7.5*   < > 7.6* 7.7* 7.5* 8.1* 8.0*  MG 1.5*  --  1.5* 1.7 1.8 1.9  --   PHOS 5.1*  --  5.3* 5.2* 5.2* 5.3* 5.2*   < > = values in this interval not displayed.    GFR: Estimated Creatinine Clearance: 14.6 mL/min ([place]) (by C-G formula based on SCr of 6.13 mg/dL (H)).  Liver Function Tests: Recent Labs  Lab 10/08/21 0158 10/09/21 0630 10/10/21 0534 10/11/21 0105 10/12/21 0336 10/13/21 0154  AST 10* 11* 12* 11* 12*  --   ALT 11 6 <5 <5 <5  --   ALKPHOS 190* 164* 162* 151* 155*  --   BILITOT 0.3 0.6 0.6 0.5 0.5  --   PROT 4.7* 4.5* 4.6* 4.7* 5.3*  --   ALBUMIN 1.7* 1.8* 2.0* 2.3* 2.7* 2.7*    CBG: Recent Labs  Lab 10/13/21 0025 10/13/21 0530 10/13/21 0805 10/13/21 1216 10/13/21 1715  GLUCAP 117* 117* 104* 113* 130*  Recent Results (from the past 240 hour(s))  Aerobic/Anaerobic Culture w Gram Stain (surgical/deep wound)     Status: None   Collection Time: 10/04/21  8:44 AM   Specimen: Abscess  Result Value Ref Range  Status   Specimen Description ABSCESS NECK  Final   Special Requests NONE  Final   Gram Stain   Final    FEW WBC PRESENT, PREDOMINANTLY PMN MODERATE GRAM POSITIVE COCCI IN PAIRS IN CLUSTERS    Culture   Final    MODERATE STAPHYLOCOCCUS AUREUS NO ANAEROBES ISOLATED Performed at [facility], [address] [place], [place] [address]    Report Status 10/09/2021 FINAL  Final   Organism ID, Bacteria STAPHYLOCOCCUS AUREUS  Final      Susceptibility   Staphylococcus aureus - MIC*    CIPROFLOXACIN <=0.5 SENSITIVE Sensitive     ERYTHROMYCIN <=0.25 SENSITIVE Sensitive     GENTAMICIN <=0.5 SENSITIVE Sensitive     OXACILLIN 0.5 SENSITIVE Sensitive     TETRACYCLINE <=1 SENSITIVE Sensitive     VANCOMYCIN 1 SENSITIVE Sensitive     TRIMETH/SULFA <=10 SENSITIVE Sensitive     CLINDAMYCIN <=0.25 SENSITIVE Sensitive     RIFAMPIN <=0.5 SENSITIVE Sensitive     Inducible Clindamycin NEGATIVE Sensitive     * MODERATE STAPHYLOCOCCUS AUREUS         Radiology Studies: No results found.      Scheduled Meds:  acetaminophen  650 mg Oral Q6H   calcium acetate  667 mg Oral TID WC   Chlorhexidine Gluconate Cloth  6 each Topical Daily   [START ON 10/14/2021] Chlorhexidine Gluconate Cloth  6 each Topical Q0600   collagenase   Topical Daily   darbepoetin (ARANESP) injection - NON-DIALYSIS  60 mcg Subcutaneous Q Wed-1800   doxycycline  100 mg Oral Q12H   feeding supplement (NEPRO CARB STEADY)  237 mL Oral BID BM   heparin  5,000 Units Subcutaneous Q8H   insulin aspart  0-6 Units Subcutaneous Q4H   magnesium oxide  400 mg Oral Daily   rosuvastatin  10 mg Oral Daily   sodium bicarbonate  1,300 mg Oral TID   tamsulosin  0.4 mg Oral QPC supper   Continuous Infusions:  albumin human 25 g (10/13/21 1723)    ceFAZolin (ANCEF) IV 1 g (10/13/21 1100)   furosemide 120 mg (10/13/21 1725)     LOS: 11 days    Time spent: over 30 min    Lindsey Helper, MD [facility]   To contact  the attending provider between 7A-7P or the covering provider during after hours 7P-7A, please log into the web site [email or web address] and access using universal [place] password for that web site. If you do not have the password, please call the hospital operator.  10/13/2021, 5:27 PM

## 2021-10-13 NOTE — Consult Note (Addendum)
[place] Nurse Consult Note: Bedside nurse has noted a Deep tissue pressure injury to the right heel. Performed remotely after review of the nursing flow-sheet. There types of wounds are high risk to evolve into full thickness tissue loss within 7-10 days.  Orders provided to float the heels to reduce pressure and Foam dressing to right heel, change Q 3 days or PRN soiling Pressure Injury POA: No WOC team will assess the location next week to determine if a change in the plan of care is indicated at that time.  Julien Girt MSN, RN, [place], [place], [place]

## 2021-10-13 NOTE — Progress Notes (Signed)
Occupational Therapy Treatment Patient Details Name: Lindsey Murillo MRN: [id number] DOB: 01-02-1996 Today's Date: 10/13/2021   History of present illness Lindsey Murillo is a 26 y.o. female  initially presented to [facility] ED on 5/25 with complaints of not feeling well, lower extremity edema, and posterior neck pain with associated drainage.  Mother at bedside assists with HPI given patient's resistance/evasive to questions.  Her mother reports that patient has not been feeling well for the 2 weeks prior and noted an area of the posterior left neck that started draining 2 days prior to admit. Transferred to [facility] on 5/26 for possible surgery.  Pt underwent I&D left neck abscess on 5/28. Pt with severe sepsis.  Also with right breast abscess that is being watched.  PMH: type 1 diabetes mellitus, stage IV CKD with nephrotic syndrome, anemia of chronic medical/renal disease, diabetic neuropathy, HTN   OT comments  Pt  presented in room in chair and requesting to ambulate. Pt required mod to max assist with sit to stand transfer but then was able to ambulate about 100 ft with FW and close min guard while following with chair. Pt required max to total with donning BLE socks prior to ambulation. Pt was very happy about progress. Pt currently with functional limitations due to the deficits listed below (see OT Problem List).  Pt will benefit from skilled OT to increase their safety and independence with ADL and functional mobility for ADL to facilitate discharge to venue listed below.     Recommendations for follow up therapy are one component of a multi-disciplinary discharge planning process, led by the attending physician.  Recommendations may be updated based on patient status, additional functional criteria and insurance authorization.    Follow Up Recommendations  Acute inpatient rehab (3hours/day)    Assistance Recommended at Discharge Frequent or constant Supervision/Assistance  Patient can  return home with the following  Two people to help with walking and/or transfers;Two people to help with bathing/dressing/bathroom   Equipment Recommendations  Other (comment) (TBD)    Recommendations for Other Services      Precautions / Restrictions Precautions Precautions: Fall Restrictions Weight Bearing Restrictions: No       Mobility Bed Mobility Overal bed mobility:  (presented sitting in chair)                  Transfers Overall transfer level: Needs assistance Equipment used: Rolling walker (2 wheels) Transfers: Sit to/from Stand Sit to Stand: Mod assist, Max assist           General transfer comment: needed pt counting to 3 and used rocking method     Balance Overall balance assessment: Needs assistance Sitting-balance support: Single extremity supported, Bilateral upper extremity supported, Feet supported Sitting balance-Leahy Scale: Fair     Standing balance support: Bilateral upper extremity supported, Single extremity supported Standing balance-Leahy Scale: Poor                             ADL either performed or assessed with clinical judgement   ADL Overall ADL's : Needs assistance/impaired     Grooming: Minimal assistance;Sitting;Cueing for safety;Cueing for sequencing   Upper Body Bathing: Minimal assistance;Cueing for safety;Cueing for sequencing;Sitting   Lower Body Bathing: Maximal assistance;Total assistance;Sitting/lateral leans   Upper Body Dressing : Minimal assistance;Sitting   Lower Body Dressing: Maximal assistance;Total assistance;Sit to/from stand   Toilet Transfer: Min guard;Cueing for safety;Cueing for sequencing;Rolling walker (2 wheels)  Functional mobility during ADLs: Min guard;Rolling walker (2 wheels);Cueing for sequencing;Cueing for safety General ADL Comments: Pt was able to complete with RW about 100 ft in ambulation with close min guard and stadning rest brake x1 with chair following  behind pt    Extremity/Trunk Assessment Upper Extremity Assessment Upper Extremity Assessment: Generalized weakness   Lower Extremity Assessment Lower Extremity Assessment: Defer to PT evaluation        Vision       Perception     Praxis      Cognition Arousal/Alertness: Awake/alert Behavior During Therapy: Flat affect Overall Cognitive Status: Impaired/Different from baseline Area of Impairment: Awareness, Following commands, Safety/judgement, Problem solving, Attention                   Current Attention Level: Sustained   Following Commands: Follows one step commands inconsistently, Follows one step commands with increased time Safety/Judgement: Decreased awareness of safety, Decreased awareness of deficits Awareness: Intellectual Problem Solving: Slow processing, Difficulty sequencing, Decreased initiation, Requires verbal cues, Requires tactile cues General Comments: Pt noted to increase in level of answering questions but with increase in time and low stimulus enviroment        Exercises      Shoulder Instructions       General Comments      Pertinent Vitals/ Pain       Pain Assessment Pain Assessment: Faces Faces Pain Scale: Hurts a little bit Pain Location: with position of BLE Pain Descriptors / Indicators: Grimacing Pain Intervention(s): Limited activity within patient's tolerance, Monitored during session  Home Living                                          Prior Functioning/Environment              Frequency  Min 2X/week        Progress Toward Goals  OT Goals(current goals can now be found in the care plan section)  Progress towards OT goals: Progressing toward goals  Acute Rehab OT Goals Patient Stated Goal: to walk OT Goal Formulation: With patient Time For Goal Achievement: 10/20/21 Potential to Achieve Goals: Good ADL Goals Pt Will Perform Grooming: with min guard assist;sitting Pt Will Perform  Upper Body Bathing: with min assist;sitting Pt Will Transfer to Toilet: with mod assist;bedside commode;stand pivot transfer Additional ADL Goal #1: pt will complete bed mobility min (A) as precursor to adls. Additional ADL Goal #2: pt will follow 2 step command 50% of session  Plan Discharge plan remains appropriate    Co-evaluation                 AM-PAC OT "6 Clicks" Daily Activity     Outcome Measure   Help from another person eating meals?: A Little Help from another person taking care of personal grooming?: A Little Help from another person toileting, which includes using toliet, bedpan, or urinal?: A Lot Help from another person bathing (including washing, rinsing, drying)?: A Lot Help from another person to put on and taking off regular upper body clothing?: A Little Help from another person to put on and taking off regular lower body clothing?: A Lot 6 Click Score: 15    End of Session Equipment Utilized During Treatment: Gait belt;Rolling walker (2 wheels)  OT Visit Diagnosis: Unsteadiness on feet (R26.81);Muscle weakness (generalized) (M62.81)   Activity Tolerance Patient tolerated  treatment well   Patient Left in chair;with call bell/phone within reach;with family/visitor present   Nurse Communication Mobility status        Time: 1224-4975 OT Time Calculation (min): 35 min  Charges: OT General Charges $OT Visit: 1 Visit OT Treatments $Self Care/Home Management : 23-37 mins  Joeseph Amor OTR/L  Acute Rehab Services  [phone] office number [phone] pager number   Joeseph Amor 10/13/2021, 12:32 PM

## 2021-10-14 ENCOUNTER — Telehealth: Payer: Self-pay | Admitting: *Deleted

## 2021-10-14 ENCOUNTER — Encounter: Payer: Self-pay | Admitting: Internal Medicine

## 2021-10-14 DIAGNOSIS — N61 Mastitis without abscess: Secondary | ICD-10-CM | POA: Diagnosis not present

## 2021-10-14 DIAGNOSIS — A4101 Sepsis due to Methicillin susceptible Staphylococcus aureus: Secondary | ICD-10-CM

## 2021-10-14 DIAGNOSIS — N184 Chronic kidney disease, stage 4 (severe): Secondary | ICD-10-CM | POA: Diagnosis not present

## 2021-10-14 DIAGNOSIS — N049 Nephrotic syndrome with unspecified morphologic changes: Secondary | ICD-10-CM

## 2021-10-14 DIAGNOSIS — E871 Hypo-osmolality and hyponatremia: Secondary | ICD-10-CM

## 2021-10-14 DIAGNOSIS — N179 Acute kidney failure, unspecified: Secondary | ICD-10-CM | POA: Diagnosis not present

## 2021-10-14 LAB — RENAL FUNCTION PANEL
Albumin: 2.6 g/dL — ABNORMAL LOW (ref 3.5–5.0)
Anion gap: 12 (ref 5–15)
BUN: 72 mg/dL — ABNORMAL HIGH (ref 6–20)
CO2: 23 mmol/L (ref 22–32)
Calcium: 8.2 mg/dL — ABNORMAL LOW (ref 8.9–10.3)
Chloride: 97 mmol/L — ABNORMAL LOW (ref 98–111)
Creatinine, Ser: 6.21 mg/dL — ABNORMAL HIGH (ref 0.44–1.00)
GFR, Estimated: 9 mL/min — ABNORMAL LOW (ref >60)
Glucose, Bld: 143 mg/dL — ABNORMAL HIGH (ref 70–99)
Phosphorus: 5.2 mg/dL — ABNORMAL HIGH (ref 2.5–4.6)
Potassium: 3.8 mmol/L (ref 3.5–5.1)
Sodium: 132 mmol/L — ABNORMAL LOW (ref 135–145)

## 2021-10-14 LAB — GLUCOSE, CAPILLARY
Glucose-Capillary: 165 mg/dL — ABNORMAL HIGH (ref 70–99)
Glucose-Capillary: 97 mg/dL (ref 70–99)
Glucose-Capillary: 80 mg/dL (ref 70–99)
Glucose-Capillary: 90 mg/dL (ref 70–99)
Glucose-Capillary: 158 mg/dL — ABNORMAL HIGH (ref 70–99)
Glucose-Capillary: 147 mg/dL — ABNORMAL HIGH (ref 70–99)

## 2021-10-14 LAB — HEPATITIS C ANTIBODY: HCV Ab: NONREACTIVE

## 2021-10-14 LAB — HEPATITIS B CORE ANTIBODY, TOTAL: Hep B Core Total Ab: NONREACTIVE

## 2021-10-14 MED ORDER — ALTEPLASE 2 MG IJ SOLR: 2.0000 mg | Freq: Once | INTRAMUSCULAR | Status: DC | PRN

## 2021-10-14 MED ORDER — PENTAFLUOROPROP-TETRAFLUOROETH EX AERO: 1.0000 "application " | INHALATION_SPRAY | CUTANEOUS | Status: DC | PRN

## 2021-10-14 MED ORDER — LIDOCAINE HCL (PF) 1 % IJ SOLN: 5.0000 mL | INTRAMUSCULAR | Status: DC | PRN

## 2021-10-14 MED ORDER — LIDOCAINE-PRILOCAINE 2.5-2.5 % EX CREA: 1.0000 "application " | TOPICAL_CREAM | CUTANEOUS | Status: DC | PRN

## 2021-10-14 MED ORDER — FUROSEMIDE 10 MG/ML IJ SOLN
120.0000 mg | Freq: Two times a day (BID) | INTRAVENOUS | Status: DC
  Administered 2021-10-14 – 2021-10-20 (×11): 120 mg via INTRAVENOUS
  Filled 2021-10-14: qty 2
  Filled 2021-10-14: qty 12
  Filled 2021-10-14: qty 10
  Filled 2021-10-14 (×5): qty 12
  Filled 2021-10-14: qty 10
  Filled 2021-10-14: qty 12
  Filled 2021-10-14 (×6): qty 10

## 2021-10-14 MED ORDER — HEPARIN SODIUM (PORCINE) 1000 UNIT/ML DIALYSIS
1000.0000 [IU] | INTRAMUSCULAR | Status: DC | PRN
  Administered 2021-10-14: 3000 [IU]
  Filled 2021-10-14 (×3): qty 1

## 2021-10-14 MED ORDER — ANTICOAGULANT SODIUM CITRATE 4% (200MG/5ML) IV SOLN
5.0000 mL | Status: DC | PRN
  Filled 2021-10-14: qty 5

## 2021-10-14 NOTE — Progress Notes (Signed)
Mobility Specialist Progress Note:   10/14/21 1500  Mobility  Activity Transferred to/from [facility]  Level of Assistance Minimal assist, patient does 75% or more  Assistive Device Front wheel walker  Distance Ambulated (ft) 2 ft  Activity Response Tolerated well  $Mobility charge 1 Mobility   Pt received on BSC with diarrhea. Required minA to stand with RW, totalA for pericare from grandmother. Pt constantly needing to sit back down on BSC d/t BM frequency. Further mobility deferred, pt back in bed with all needs met. Bed alarm on.   Nelta Numbers Acute Rehab Secure Chat or Office Phone: [phone]

## 2021-10-14 NOTE — Progress Notes (Signed)
PROGRESS NOTE    Lindsey Murillo  UXN:[id number] DOB: 06/25/1995 DOA: 10/01/2021 PCP: Coral Spikes, DO    Chief Complaint  Patient presents with   Leg Swelling    X 2 weeks     Brief Narrative:  Lindsey Murillo is a 26 y.o. female with past medical history significant for is a 26 year old female with past medical history significant for type 1 diabetes mellitus, stage IV CKD with nephrotic syndrome, anemia of chronic medical/renal disease, diabetic neuropathy, HTN who initially presented to [facility] ED on 5/25 with complaints of not feeling well, lower extremity edema, and posterior neck pain with associated drainage.  Mother at bedside assists with HPI given patient's resistance/evasive to questions.  Her mother reports that patient has not been feeling well for the last 2 weeks and noted a area of the posterior left neck that started draining 2 days prior.  Patient/mother denied any trauma to that region.  Pain is worse with any type of range of motion of the neck, nothing seems to make it better.  Mother put some ointment on it which offered her no relief.   In the ED, temperature 98.2 F, HR 102, RR 17, BP 96/67, SPO2 100% on room air.  Sodium 130, potassium 2.7, chloride 107, CO2 14, glucose 179, BUN 66, creatinine 4.76.  BNP 626.0.  WBC 38.9, hemoglobin 8.6, platelet count 330.  Lactic acid 0.7, procalcitonin 1.85.  INR 1.2.  Urinalysis with trace leukocytes, negative nitrite, rare bacteria, 11-20 WBCs.  Pregnancy test negative.  Chest x-ray with cardiomegaly, no active cardiopulmonary disease process.  CT soft tissue neck with area of phlegmon/abscess measuring 4 x 2.4 x 4.8 cm localized upper left neck with enlarged left upper cervical chain lymph nodes.  Blood cultures x2 were drawn.  Patient was started on vancomycin, cefepime, Flagyl.  Initially admitted to [facility].  Valuated by general surgery, Dr. Arnoldo Morale and requested transfer to a higher level of care due to  multiple comorbidities.  Case was discussed with ENT, Dr. Benjamine Mola; and location was out of scope of ENT and recommended transfer to [facility] for general surgery evaluation.  Patient was transferred to [facility] and admitted to the hospital service with general surgery in consultation.   She's now s/p I&D of L side of neck.  Surgery signed off, plan for 10-14 days abx.  R breast without evidence of abscess, plan for outpatient follow up, 10 days doxycycline.  Renal following for aki on CKD, tunneled HD cath placed today.      Assessment & Plan:  Principal Problem:   Sepsis ([place]) Active Problems:   Cellulitis of right breast   Uncontrolled type 1 diabetes mellitus with hyperglycemia ([name])   Pyuria   AKI (acute kidney injury) ([place])   Nephrotic syndrome   Hyponatremia   Peripheral edema   Hypokalemia   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 chronic kidney disease ([name])   Protein-calorie malnutrition, severe ([name])   Physical deconditioning   Hypomagnesemia   Broken tooth   Neck abscess   CKD (chronic kidney disease) stage 4, GFR 15-29 ml/min ([name])    Assessment and Plan: * Sepsis ([place]) Ruled in for sepsis at admission Due to L neck abscess CT with 4x2.4x4.8 cm phlegmon (limited evaluation due to absence of contrast), enlarged L upper chain cervical chain LN S/p I&D 5/28, per surgery -> wet to dry dressing changes BID, santyl to wound base Concern for R breast infection as well, no definite or  focal fluid collection noted on [place] MSSA on 5/28 culture Blood cultures ngtd Vancomycin 5/25 Cefepime 5/25-5/28 linezolid 5/27 - 5/30 Ancef 5/30 - present Narrowed to ancef, continue for now (will plan for at least 14 days, then reeval) Surgery recommending 10 days abx (doxycycline) for R breast White count continuing to downtrend gradually  Cellulitis of right breast US without definite or focal fluid collection Needs outpatient follow up, this has been arranged by surgery -> follow up with surgery and at  breast clinic Surgery recommended 10 days doxy  Pyuria UA with bacteria and yeast - no clear symptoms, suspect contaminant Follow culture only if symptomatic  Uncontrolled type 1 diabetes mellitus with hyperglycemia ([place]) Uncontrolled type I diabetic with a last hemoglobin A1c of 9.8 (09/24/2021). Basal insulin appears it was d/c'd due to hypoglycemia?  Hold cautiously for now (she was just on D5 IVF and still wasn't requiring much insulin) -CBG noted at 80 this morning. -SSI.   Hyponatremia Overloaded - improved with diuresis  Follow with diuresis  Nephrotic syndrome - Per nephrology.  AKI (acute kidney injury) ([place]) Creatinine 6.21 today - relatively stable with diuresis  Baseline ~3 Renal US today without hydro Appears overloaded, LE edema appears to be improving gradually Renal consult, appreciate assistance - temp HD cath placed  Diuresis with albumin given edema/nephrotic syndrome -Patient also underwent hemodialysis today per nephrology. -Per nephrology patient likely progressed to ESRD given biopsy results.  Peripheral edema Diuresis with albumin for nephrotic syndrome  Hypokalemia Resolved, follow  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 chronic kidney disease ([name]) Follow Elevated ferritin, low iron - normal folate, elevated b12 S/p 1 unit pRBC -Repeat H&H in the AM.  Protein-calorie malnutrition, severe ([name]) - Continue nutritional supplementation.  Physical deconditioning PT  Hypomagnesemia Repleted.  Magnesium noted at 1.9  -Repeat labs in the a.m.   Broken tooth Upper right Pain related to this Continue abx for now  Needs dental f/u outpatient         DVT prophylaxis: Heparin Code Status: Full Family Communication: Updated patient and family at bedside. Disposition: TBD  Status is: Inpatient Remains inpatient appropriate because: Severity of illness   Consultants:  General surgery: Dr. Donne Hazel 10/03/2021 Nephrology: Dr. Candiss Norse 10/07/2021 Wound care  RN  Procedures:  CT soft tissue neck 10/02/2021 Chest x-ray 10/01/2021 Renal ultrasound 10/07/2021 Ultrasound of left breast 10/04/2021 Ultrasound of the head and neck 10/02/2021 Irrigation and debridement neck abscess by Dr. Rosendo Gros 10/04/2021/general surgery    Antimicrobials:  Anti-infectives (From admission, onward)    Start     Dose/Rate Route Frequency Ordered Stop   10/13/21 2200  doxycycline (VIBRA-TABS) tablet 100 mg        100 mg Oral Every 12 hours 10/13/21 1719 10/23/21 2159   10/06/21 2200  ceFAZolin (ANCEF) IVPB 1 g/50 mL premix        1 g 100 mL/hr over 30 Minutes Intravenous Every 12 hours 10/06/21 1052     10/04/21 2200  ceFEPIme (MAXIPIME) 1 g in sodium chloride 0.9 % 100 mL IVPB  Status:  Discontinued        1 g 200 mL/hr over 30 Minutes Intravenous Every 24 hours 10/04/21 1011 10/05/21 1343   10/03/21 2200  vancomycin (VANCOREADY) IVPB 750 mg/150 mL  Status:  Discontinued        750 mg 150 mL/hr over 60 Minutes Intravenous Every 48 hours 10/02/21 0033 10/02/21 1144   10/03/21 1000  linezolid (ZYVOX) IVPB 600 mg  Status:  Discontinued  600 mg 300 mL/hr over 60 Minutes Intravenous Every 12 hours 10/02/21 1144 10/06/21 1052   10/02/21 2200  ceFEPIme (MAXIPIME) 2 g in sodium chloride 0.9 % 100 mL IVPB  Status:  Discontinued        2 g 200 mL/hr over 30 Minutes Intravenous Every 24 hours 10/02/21 0033 10/04/21 1011   10/02/21 0000  ceFEPIme (MAXIPIME) 2 g in sodium chloride 0.9 % 100 mL IVPB        2 g 200 mL/hr over 30 Minutes Intravenous  Once 10/01/21 2358 10/02/21 0246   10/02/21 0000  metroNIDAZOLE (FLAGYL) IVPB 500 mg        500 mg 100 mL/hr over 60 Minutes Intravenous  Once 10/01/21 2358 10/02/21 0246   10/02/21 0000  vancomycin (VANCOCIN) IVPB 1000 mg/200 mL premix        1,000 mg 200 mL/hr over 60 Minutes Intravenous  Once 10/01/21 2358 10/02/21 0244         Subjective: Sitting up in bed.  Eating lunch.  Grandmother and mother at bedside.  No  chest pain.  No shortness of breath.  Underwent hemodialysis today  Objective: Vitals:   10/14/21 1059 10/14/21 1133 10/14/21 1149 10/14/21 1733  BP: (!) 140/94 (!) 137/97 (!) 154/93 (!) 153/96  Pulse: 86 90 93 92  Resp: 19 (!) 24 19 17   Temp:   (!) 96.9 F (36.1 C) 98.2 F (36.8 C)  TempSrc:    Oral  SpO2: 98% 99% 99% 98%  Weight:   65 kg   Height:        Intake/Output Summary (Last 24 hours) at 10/14/2021 1935 Last data filed at 10/14/2021 0631 Gross per 24 hour  Intake 160 ml  Output 250 ml  Net -90 ml   Filed Weights   10/14/21 0452 10/14/21 0928 10/14/21 1149  Weight: 76.2 kg 66.3 kg 65 kg    Examination:  General exam: Appears calm and comfortable .  Left neck wound stable. Respiratory system: Clear to auscultation. Respiratory effort normal. Cardiovascular system: S1 & S2 heard, RRR. No JVD, murmurs, rubs, gallops or clicks. No pedal edema. Gastrointestinal system: Abdomen is nondistended, soft and nontender. No organomegaly or masses felt. Normal bowel sounds heard. Central nervous system: Alert and oriented. No focal neurological deficits. Extremities: Symmetric 5 x 5 power. Skin: No rashes, lesions or ulcers Psychiatry: Judgement and insight appear normal. Mood & affect appropriate.     Data Reviewed:   CBC: Recent Labs  Lab 10/08/21 0158 10/09/21 0630 10/10/21 0534 10/11/21 0105 10/12/21 0336 10/13/21 2233  WBC 26.9* 21.4* 14.3* 13.5* 13.4* 13.4*  NEUTROABS 24.6* 18.8* 11.8* 10.8* 10.3*  --   HGB 7.1* 7.2* 7.1* 6.5* 8.7* 7.2*  HCT 21.9* 21.7* 21.4* 19.6* 25.6* 22.2*  MCV 86.6 84.4 84.3 84.8 83.4 85.7  PLT 238 208 173 147* 136* 101*    Basic Metabolic Panel: Recent Labs  Lab 10/08/21 0158 10/08/21 1657 10/09/21 0630 10/10/21 0534 10/11/21 0105 10/12/21 0336 10/13/21 0154 10/14/21 0222  NA 122*   < > 125* 127* 126* 130* 131* 132*  K 4.3   < > 4.3 4.2 4.1 4.2 3.8 3.8  CL 94*   < > 95* 95* 95* 97* 100 97*  CO2 16*   < > 17* 19* 18* 20* 20*  23  GLUCOSE 97   < > 90 113* 131* 94 121* 143*  BUN 63*   < > 66* 68* 68* 69* 70* 72*  CREATININE 5.54*   < >  6.00* 5.97* 5.91* 5.96* 6.13* 6.21*  CALCIUM 7.5*   < > 7.6* 7.7* 7.5* 8.1* 8.0* 8.2*  MG 1.5*  --  1.5* 1.7 1.8 1.9  --   --   PHOS 5.1*  --  5.3* 5.2* 5.2* 5.3* 5.2* 5.2*   < > = values in this interval not displayed.    GFR: Estimated Creatinine Clearance: 13.2 mL/min (A) (by C-G formula based on SCr of 6.21 mg/dL (H)).  Liver Function Tests: Recent Labs  Lab 10/08/21 0158 10/09/21 0630 10/10/21 0534 10/11/21 0105 10/12/21 0336 10/13/21 0154 10/14/21 0222  AST 10* 11* 12* 11* 12*  --   --   ALT 11 6 <5 <5 <5  --   --   ALKPHOS 190* 164* 162* 151* 155*  --   --   BILITOT 0.3 0.6 0.6 0.5 0.5  --   --   PROT 4.7* 4.5* 4.6* 4.7* 5.3*  --   --   ALBUMIN 1.7* 1.8* 2.0* 2.3* 2.7* 2.7* 2.6*    CBG: Recent Labs  Lab 10/14/21 0336 10/14/21 0825 10/14/21 0916 10/14/21 1305 10/14/21 1735  GLUCAP 165* 97 80 90 158*     No results found for this or any previous visit (from the past 240 hour(s)).       Radiology Studies: IR Fluoro Guide CV Line Right  Result Date: 10/14/2021 INDICATION: 26 year old female with renal failure requiring central venous access for hemodialysis. EXAM: NON-TUNNELED CENTRAL VENOUS HEMODIALYSIS CATHETER PLACEMENT WITH ULTRASOUND AND FLUOROSCOPIC GUIDANCE COMPARISON:  None Available. MEDICATIONS: None FLUOROSCOPY TIME:  One mGy COMPLICATIONS: None immediate. PROCEDURE: Informed written consent was obtained from the patient after a discussion of the risks, benefits, and alternatives to treatment. Questions regarding the procedure were encouraged and answered. The right neck and chest were prepped with chlorhexidine in a sterile fashion, and a sterile drape was applied covering the operative field. Maximum barrier sterile technique with sterile gowns and gloves were used for the procedure. A timeout was performed prior to the initiation of the  procedure. After the overlying soft tissues were anesthetized, a small venotomy incision was created and a micropuncture kit was utilized to access the internal jugular vein. Real-time ultrasound guidance was utilized for vascular access including the acquisition of a permanent ultrasound image documenting patency of the accessed vessel. The microwire was utilized to measure appropriate catheter length. A Rosen wire was advanced to the level of the IVC. Under fluoroscopic guidance, the venotomy was serially dilated, ultimately allowing placement of a 15 cm temporary Trialysis catheter with tip ultimately terminating within the superior aspect of the right atrium. Final catheter positioning was confirmed and documented with a spot radiographic image. The catheter aspirates and flushes normally. The catheter was flushed with appropriate volume heparin dwells. The catheter exit site was secured with a 0-silk retention suture. A dressing was placed. The patient tolerated the procedure well without immediate post procedural complication. IMPRESSION: Successful placement of a right internal jugular approach 15 cm temporary dialysis catheter with tip terminating with in the superior aspect of the right atrium. The catheter is ready for immediate use. PLAN: This catheter may be converted to a tunneled dialysis catheter at a later date as indicated. Ruthann Cancer, MD [facility] Radiology Electronically Signed   By: Ruthann Cancer M.D.   On: 10/14/2021 10:49   IR US Guide Vasc Access Right  Result Date: 10/14/2021 INDICATION: 26 year old female with renal failure requiring central venous access for hemodialysis. EXAM: NON-TUNNELED  CENTRAL VENOUS HEMODIALYSIS CATHETER PLACEMENT WITH ULTRASOUND AND FLUOROSCOPIC GUIDANCE COMPARISON:  None Available. MEDICATIONS: None FLUOROSCOPY TIME:  One mGy COMPLICATIONS: None immediate. PROCEDURE: Informed written consent was obtained from  the patient after a discussion of the risks, benefits, and alternatives to treatment. Questions regarding the procedure were encouraged and answered. The right neck and chest were prepped with chlorhexidine in a sterile fashion, and a sterile drape was applied covering the operative field. Maximum barrier sterile technique with sterile gowns and gloves were used for the procedure. A timeout was performed prior to the initiation of the procedure. After the overlying soft tissues were anesthetized, a small venotomy incision was created and a micropuncture kit was utilized to access the internal jugular vein. Real-time ultrasound guidance was utilized for vascular access including the acquisition of a permanent ultrasound image documenting patency of the accessed vessel. The microwire was utilized to measure appropriate catheter length. A Rosen wire was advanced to the level of the IVC. Under fluoroscopic guidance, the venotomy was serially dilated, ultimately allowing placement of a 15 cm temporary Trialysis catheter with tip ultimately terminating within the superior aspect of the right atrium. Final catheter positioning was confirmed and documented with a spot radiographic image. The catheter aspirates and flushes normally. The catheter was flushed with appropriate volume heparin dwells. The catheter exit site was secured with a 0-silk retention suture. A dressing was placed. The patient tolerated the procedure well without immediate post procedural complication. IMPRESSION: Successful placement of a right internal jugular approach 15 cm temporary dialysis catheter with tip terminating with in the superior aspect of the right atrium. The catheter is ready for immediate use. PLAN: This catheter may be converted to a tunneled dialysis catheter at a later date as indicated. Ruthann Cancer, MD [facility] Radiology Electronically Signed   By: Ruthann Cancer M.D.   On:  10/14/2021 10:49        Scheduled Meds:  acetaminophen  650 mg Oral Q6H   calcium acetate  667 mg Oral TID WC   Chlorhexidine Gluconate Cloth  6 each Topical Daily   Chlorhexidine Gluconate Cloth  6 each Topical Q0600   collagenase   Topical Daily   darbepoetin (ARANESP) injection - NON-DIALYSIS  60 mcg Subcutaneous Q Wed-1800   doxycycline  100 mg Oral Q12H   feeding supplement (NEPRO CARB STEADY)  237 mL Oral BID BM   heparin  5,000 Units Subcutaneous Q8H   insulin aspart  0-6 Units Subcutaneous Q4H   magnesium oxide  400 mg Oral Daily   rosuvastatin  10 mg Oral Daily   sodium bicarbonate  1,300 mg Oral TID   tamsulosin  0.4 mg Oral QPC supper   Continuous Infusions:   ceFAZolin (ANCEF) IV 1 g (10/14/21 1318)   furosemide       LOS: 12 days    Time spent: 35 minutes    Irine Seal, MD [facility]   To contact the attending provider between 7A-7P or the covering provider during after hours 7P-7A, please log into the web site [email or web address] and access using universal [place] password for that web site. If you do not have the password, please call the hospital operator.  10/14/2021, 7:35 PM

## 2021-10-14 NOTE — Progress Notes (Signed)
Physical Therapy Treatment Patient Details Name: Lindsey Murillo MRN: [id number] DOB: Nov 02, 1995 Today's Date: 10/14/2021   History of Present Illness Lindsey Murillo is a 26 y.o. female  initially presented to [facility] ED on 5/25 with complaints of not feeling well, lower extremity edema, and posterior neck pain with associated drainage.  Mother at bedside assists with HPI given patient's resistance/evasive to questions.  Her mother reports that patient has not been feeling well for the 2 weeks prior and noted an area of the posterior left neck that started draining 2 days prior to admit. Transferred to [facility] on 5/26 for possible surgery.  Pt underwent I&D left neck abscess on 5/28. Pt with severe sepsis.  Also with right breast abscess that is being watched.  PMH: type 1 diabetes mellitus, stage IV CKD with nephrotic syndrome, anemia of chronic medical/renal disease, diabetic neuropathy, HTN    PT Comments    Pt with excellent progress towards acute goals this session demonstrating improved endurance with ability to ambulate with min guard and chair follow for safety for first ambulation with this PTA in hallway. Pt with noted lateral sway/rocking back and forth throughout ambulation but no LOB and generally steady with RW. Pt and mother stating rocking is similar to gait at BL. Pt continues to be limited 2/2 decreased endurance although ambulation distance increased gait velocity decreased and pt requiring multiple standing recovery breaks throughout. Pt continues to benefit from skilled PT services to progress toward functional mobility goals.    Recommendations for follow up therapy are one component of a multi-disciplinary discharge planning process, led by the attending physician.  Recommendations may be updated based on patient status, additional functional criteria and insurance authorization.  Follow Up Recommendations  Acute inpatient rehab (3hours/day)     Assistance Recommended  at Discharge Frequent or constant Supervision/Assistance  Patient can return home with the following Two people to help with walking and/or transfers;A lot of help with bathing/dressing/bathroom;Assistance with cooking/housework;Direct supervision/assist for medications management;Direct supervision/assist for financial management;Assist for transportation;Help with stairs or ramp for entrance   Equipment Recommendations  Rolling walker (2 wheels);Other (comment)    Recommendations for Other Services Rehab consult     Precautions / Restrictions Precautions Precautions: Fall Restrictions Weight Bearing Restrictions: No     Mobility  Bed Mobility Overal bed mobility: Needs Assistance Bed Mobility: Supine to Sit, Sit to Supine     Supine to sit: Mod assist Sit to supine: Mod assist   General bed mobility comments: mod a to manage LEs for all aspects    Transfers Overall transfer level: Needs assistance Equipment used: Rolling walker (2 wheels) Transfers: Sit to/from Stand Sit to Stand: Mod assist           General transfer comment: mod assist to come to full upright standing, pt rocking and counting to 3 to gerneate enertia    Ambulation/Gait Ambulation/Gait assistance: Min guard, +2 safety/equipment (+2 for chair follow for safety) Gait Distance (Feet): 520 Feet Assistive device: Rolling walker (2 wheels) Gait Pattern/deviations: Step-through pattern, Decreased stride length, Trunk flexed Gait velocity: decr     General Gait Details: slow guarded gait with lateral rocking, pt and mother stating similar gait pattern at [employer] Mobility    Modified Rankin (Stroke Patients Only)       Balance Overall balance assessment: Needs assistance Sitting-balance support: Single extremity supported, Bilateral upper extremity supported, Feet  supported Sitting balance-Leahy Scale: Fair Sitting balance - Comments: Pt was able to complete  drying of BLE but noted close min guard due to decrease in control of movement in all planes. Postural control: Right lateral lean, Left lateral lean Standing balance support: Bilateral upper extremity supported, Single extremity supported Standing balance-Leahy Scale: Poor                              Cognition Arousal/Alertness: Awake/alert Behavior During Therapy: Flat affect Overall Cognitive Status: Impaired/Different from baseline Area of Impairment: Awareness, Following commands, Safety/judgement, Problem solving, Attention                   Current Attention Level: Sustained   Following Commands: Follows one step commands inconsistently, Follows one step commands with increased time Safety/Judgement: Decreased awareness of safety, Decreased awareness of deficits Awareness: Intellectual Problem Solving: Slow processing, Difficulty sequencing, Decreased initiation, Requires verbal cues, Requires tactile cues General Comments: Pt noted to increase in level of answering questions but with increase in time and low stimulus enviroment        Exercises      General Comments General comments (skin integrity, edema, etc.): family present throughout, engaged and supportive      Pertinent Vitals/Pain Pain Assessment Pain Assessment: Faces Faces Pain Scale: Hurts a little bit Pain Location: with position of BLE Pain Descriptors / Indicators: Grimacing Pain Intervention(s): Limited activity within patient's tolerance, Monitored during session, Repositioned    Home Living                          Prior Function            PT Goals (current goals can now be found in the care plan section) Acute Rehab PT Goals PT Goal Formulation: With patient/family Time For Goal Achievement: 10/19/21    Frequency    Min 3X/week      PT Plan      Co-evaluation              AM-PAC PT "6 Clicks" Mobility   Outcome Measure  Help needed turning  from your back to your side while in a flat bed without using bedrails?: A Little Help needed moving from lying on your back to sitting on the side of a flat bed without using bedrails?: A Lot Help needed moving to and from a bed to a chair (including a wheelchair)?: A Lot Help needed standing up from a chair using your arms (e.g., wheelchair or bedside chair)?: A Lot Help needed to walk in hospital room?: A Little Help needed climbing 3-5 steps with a railing? : Total 6 Click Score: 13    End of Session Equipment Utilized During Treatment: Gait belt Activity Tolerance: Patient tolerated treatment well Patient left: with family/visitor present;in bed;with call bell/phone within reach Nurse Communication: Mobility status PT Visit Diagnosis: Unsteadiness on feet (R26.81);Muscle weakness (generalized) (M62.81);Pain;Difficulty in walking, not elsewhere classified (R26.2) Pain - part of body:  (neck)     Time: 1856-3149 PT Time Calculation (min) (ACUTE ONLY): 23 min  Charges:  $Gait Training: 23-37 mins                     Ziah Leandro R. PTA Acute Rehabilitation Services Office: [place] 10/14/2021, 4:31 PM

## 2021-10-14 NOTE — Assessment & Plan Note (Addendum)
-   Was on albumin and IV Lasix.   -Albumin completed.   -On IV Lasix and HD. -Lasix subsequently discontinued. -Outpatient follow-up with nephrology.

## 2021-10-14 NOTE — Telephone Encounter (Signed)
-----   Message from Eloise Harman, DO sent at 10/14/2021 12:10 PM EDT ----- Regarding: RE: in patient I think we need to reschedule her.  Just have her come in for office visit in 2 to 3 months with APP or myself.  Thank you  ----- Message ----- From: Encarnacion Chu, RN Sent: 10/14/2021   9:25 AM EDT To: Ailish Prospero S Leathia Farnell, CMA, Eloise Harman, DO Subject: in patient                                     Good morning! Jamayia Murchinson is currently at [facility] where she was admitted 5/28. She is scheduled for colonoscopy 6/12. Will you look at her chart and see if we need to reschedule her? Thank you!

## 2021-10-14 NOTE — Progress Notes (Signed)
Patient ID: Lindsey Murillo, female   DOB: [birth year], 26 y.o.   MRN: [id number] S: No complaints this morning but admits that she is a little anxious about starting dialysis. O:BP (!) 137/97   Pulse 90   Temp 97.9 F (36.6 C) (Oral)   Resp (!) 24   Ht 5' 7.01" (1.702 m)   Wt 66.3 kg   SpO2 99%   BMI 22.89 kg/m   Intake/Output Summary (Last 24 hours) at 10/14/2021 1138 Last data filed at 10/14/2021 0631 Gross per 24 hour  Intake 160 ml  Output 775 ml  Net -615 ml   Intake/Output: I/O last 3 completed shifts: In: 35 [P.O.:300; IV Piggyback:212] Out: 250 [Urine:775]  Intake/Output this shift:  No intake/output data recorded. Weight change:  Gen:NAD CVS: RRR Resp:CTA Abd: +BS, soft, NT/ND Ext: 2+ edema of legs to hips  Recent Labs  Lab 10/08/21 0158 10/08/21 1657 10/09/21 0630 10/10/21 0534 10/11/21 0105 10/12/21 0336 10/13/21 0154 10/14/21 0222  NA 122* 123* 125* 127* 126* 130* 131* 132*  K 4.3 4.3 4.3 4.2 4.1 4.2 3.8 3.8  CL 94* 95* 95* 95* 95* 97* 100 97*  CO2 16* 17* 17* 19* 18* 20* 20* 23  GLUCOSE 97 93 90 113* 131* 94 121* 143*  BUN 63* 66* 66* 68* 68* 69* 70* 72*  CREATININE 5.54* 5.83* 6.00* 5.97* 5.91* 5.96* 6.13* 6.21*  ALBUMIN 1.7*  --  1.8* 2.0* 2.3* 2.7* 2.7* 2.6*  CALCIUM 7.5* 7.4* 7.6* 7.7* 7.5* 8.1* 8.0* 8.2*  PHOS 5.1*  --  5.3* 5.2* 5.2* 5.3* 5.2* 5.2*  AST 10*  --  11* 12* 11* 12*  --   --   ALT 11  --  6 <5 <5 <5  --   --    Liver Function Tests: Recent Labs  Lab 10/10/21 0534 10/11/21 0105 10/12/21 0336 10/13/21 0154 10/14/21 0222  AST 12* 11* 12*  --   --   ALT <5 <5 <5  --   --   ALKPHOS 162* 151* 155*  --   --   BILITOT 0.6 0.5 0.5  --   --   PROT 4.6* 4.7* 5.3*  --   --   ALBUMIN 2.0* 2.3* 2.7* 2.7* 2.6*   No results for input(s): LIPASE, AMYLASE in the last 168 hours. No results for input(s): AMMONIA in the last 168 hours. CBC: Recent Labs  Lab 10/09/21 0630 10/10/21 0534 10/11/21 0105 10/12/21 0336 10/13/21 2233   WBC 21.4* 14.3* 13.5* 13.4* 13.4*  NEUTROABS 18.8* 11.8* 10.8* 10.3*  --   HGB 7.2* 7.1* 6.5* 8.7* 7.2*  HCT 21.7* 21.4* 19.6* 25.6* 22.2*  MCV 84.4 84.3 84.8 83.4 85.7  PLT 208 173 147* 136* 101*   Cardiac Enzymes: No results for input(s): CKTOTAL, CKMB, CKMBINDEX, TROPONINI in the last 168 hours. CBG: Recent Labs  Lab 10/13/21 2015 10/13/21 2348 10/14/21 0336 10/14/21 0825 10/14/21 0916  GLUCAP 158* 180* 165* 97 80    Iron Studies: No results for input(s): IRON, TIBC, TRANSFERRIN, FERRITIN in the last 72 hours. Studies/Results: IR Fluoro Guide CV Line Right  Result Date: 10/14/2021 INDICATION: 26 year old female with renal failure requiring central venous access for hemodialysis. EXAM: NON-TUNNELED CENTRAL VENOUS HEMODIALYSIS CATHETER PLACEMENT WITH ULTRASOUND AND FLUOROSCOPIC GUIDANCE COMPARISON:  None Available. MEDICATIONS: None FLUOROSCOPY TIME:  One mGy COMPLICATIONS: None immediate. PROCEDURE: Informed written consent was obtained from the patient after a discussion of the risks, benefits, and alternatives to treatment. Questions regarding the procedure were  encouraged and answered. The right neck and chest were prepped with chlorhexidine in a sterile fashion, and a sterile drape was applied covering the operative field. Maximum barrier sterile technique with sterile gowns and gloves were used for the procedure. A timeout was performed prior to the initiation of the procedure. After the overlying soft tissues were anesthetized, a small venotomy incision was created and a micropuncture kit was utilized to access the internal jugular vein. Real-time ultrasound guidance was utilized for vascular access including the acquisition of a permanent ultrasound image documenting patency of the accessed vessel. The microwire was utilized to measure appropriate catheter length. A Rosen wire was advanced to the level of the IVC. Under fluoroscopic guidance, the venotomy was serially dilated,  ultimately allowing placement of a 15 cm temporary Trialysis catheter with tip ultimately terminating within the superior aspect of the right atrium. Final catheter positioning was confirmed and documented with a spot radiographic image. The catheter aspirates and flushes normally. The catheter was flushed with appropriate volume heparin dwells. The catheter exit site was secured with a 0-silk retention suture. A dressing was placed. The patient tolerated the procedure well without immediate post procedural complication. IMPRESSION: Successful placement of a right internal jugular approach 15 cm temporary dialysis catheter with tip terminating with in the superior aspect of the right atrium. The catheter is ready for immediate use. PLAN: This catheter may be converted to a tunneled dialysis catheter at a later date as indicated. Ruthann Cancer, MD [facility] Radiology Electronically Signed   By: Ruthann Cancer M.D.   On: 10/14/2021 10:49   IR US Guide Vasc Access Right  Result Date: 10/14/2021 INDICATION: 26 year old female with renal failure requiring central venous access for hemodialysis. EXAM: NON-TUNNELED CENTRAL VENOUS HEMODIALYSIS CATHETER PLACEMENT WITH ULTRASOUND AND FLUOROSCOPIC GUIDANCE COMPARISON:  None Available. MEDICATIONS: None FLUOROSCOPY TIME:  One mGy COMPLICATIONS: None immediate. PROCEDURE: Informed written consent was obtained from the patient after a discussion of the risks, benefits, and alternatives to treatment. Questions regarding the procedure were encouraged and answered. The right neck and chest were prepped with chlorhexidine in a sterile fashion, and a sterile drape was applied covering the operative field. Maximum barrier sterile technique with sterile gowns and gloves were used for the procedure. A timeout was performed prior to the initiation of the procedure. After the overlying soft tissues were anesthetized, a small venotomy  incision was created and a micropuncture kit was utilized to access the internal jugular vein. Real-time ultrasound guidance was utilized for vascular access including the acquisition of a permanent ultrasound image documenting patency of the accessed vessel. The microwire was utilized to measure appropriate catheter length. A Rosen wire was advanced to the level of the IVC. Under fluoroscopic guidance, the venotomy was serially dilated, ultimately allowing placement of a 15 cm temporary Trialysis catheter with tip ultimately terminating within the superior aspect of the right atrium. Final catheter positioning was confirmed and documented with a spot radiographic image. The catheter aspirates and flushes normally. The catheter was flushed with appropriate volume heparin dwells. The catheter exit site was secured with a 0-silk retention suture. A dressing was placed. The patient tolerated the procedure well without immediate post procedural complication. IMPRESSION: Successful placement of a right internal jugular approach 15 cm temporary dialysis catheter with tip terminating with in the superior aspect of the right atrium. The catheter is ready for immediate use. PLAN: This catheter may be converted to a tunneled dialysis catheter at a later  date as indicated. Ruthann Cancer, MD [facility] Radiology Electronically Signed   By: Ruthann Cancer M.D.   On: 10/14/2021 10:49    acetaminophen  650 mg Oral Q6H   calcium acetate  667 mg Oral TID WC   Chlorhexidine Gluconate Cloth  6 each Topical Daily   Chlorhexidine Gluconate Cloth  6 each Topical Q0600   collagenase   Topical Daily   darbepoetin (ARANESP) injection - NON-DIALYSIS  60 mcg Subcutaneous Q Wed-1800   doxycycline  100 mg Oral Q12H   feeding supplement (NEPRO CARB STEADY)  237 mL Oral BID BM   heparin  5,000 Units Subcutaneous Q8H   insulin aspart  0-6 Units Subcutaneous Q4H   magnesium oxide  400 mg  Oral Daily   rosuvastatin  10 mg Oral Daily   sodium bicarbonate  1,300 mg Oral TID   tamsulosin  0.4 mg Oral QPC supper    BMET    Component Value Date/Time   NA 132 (L) 10/14/2021 0222   NA 132 (L) 09/24/2021 1453   K 3.8 10/14/2021 0222   CL 97 (L) 10/14/2021 0222   CO2 23 10/14/2021 0222   GLUCOSE 143 (H) 10/14/2021 0222   BUN 72 (H) 10/14/2021 0222   BUN 36 (H) 09/24/2021 1453   CREATININE 6.21 (H) 10/14/2021 0222   CREATININE 0.98 08/29/2019 1637   CALCIUM 8.2 (L) 10/14/2021 0222   GFRNONAA 9 (L) 10/14/2021 0222   GFRAA >60 10/27/2019 2021   CBC    Component Value Date/Time   WBC 13.4 (H) 10/13/2021 2233   RBC 2.59 (L) 10/13/2021 2233   HGB 7.2 (L) 10/13/2021 2233   HGB 9.5 (L) 09/24/2021 1453   HCT 22.2 (L) 10/13/2021 2233   HCT 28.1 (L) 09/24/2021 1453   PLT 101 (L) 10/13/2021 2233   PLT 248 09/24/2021 1453   MCV 85.7 10/13/2021 2233   MCV 83 09/24/2021 1453   MCH 27.8 10/13/2021 2233   MCHC 32.4 10/13/2021 2233   RDW 16.0 (H) 10/13/2021 2233   RDW 14.3 09/24/2021 1453   LYMPHSABS 1.6 10/12/2021 0336   MONOABS 0.7 10/12/2021 0336   EOSABS 0.5 10/12/2021 0336   BASOSABS 0.2 (H) 10/12/2021 0336    Assessment/Plan:   AKI/CKD stage IV - Presumably due to hemodynamic insults in setting of severe sepsis.  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  BUN/Scr continue to rise and still with significant edema.  No uremic symptoms, however I did discuss with her and her mother the need to initiate HD to help with her volume status.  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  Will need to plan for permanent vascular access after her infectious issues have been resolved.  Temp HD catheter placed by IR yesterday.  Will plan for first HD session today. Avoid nephrotoxic medications including NSAIDs and iodinated intravenous contrast exposure unless the latter  is absolutely indicated.   Preferred narcotic agents for pain control are hydromorphone, fentanyl, and methadone. Morphine should not be used.  Avoid Baclofen and avoid oral sodium phosphate and magnesium citrate based laxatives / bowel preps.  Continue strict Input and Output monitoring. Will monitor the patient closely with you and intervene or adjust therapy as indicated by changes in clinical status/labs  Severe sepsis due to posterior neck abscess - MSSA and currently receiving cefazolin, IV Right breast cellulitis - currently on antibiotics.  Reviewed by surgery  and nothing to drain. Hypervolemic hyponatremia - slowly improving with diuresis.  No symptoms. Nephrotic syndrome with wedema - on lasix 120 mg bid with IV albumin to improve response. HTN - stable Anemia of CKD stage IV.  Started ESA, transfuse for Hgb <7. Uncontrolled DM type 1 - per primary Severe protein malnutrition - per primary.   Donetta Potts, MD [facility]

## 2021-10-14 NOTE — Progress Notes (Signed)
  Inpatient Rehabilitation Admissions Coordinator   Noted to begin hemodialysis. I continue to follow her progress. Not a candidate for Cir at this time as medical workup continues.  Danne Baxter, RN, MSN Rehab Admissions Coordinator [phone] 10/14/2021 12:06 PM

## 2021-10-14 NOTE — Telephone Encounter (Signed)
Noted. Called endo and cancelled. Stacey/susan will you please schedule per Dr. Abbey Chatters below. thanks

## 2021-10-15 ENCOUNTER — Encounter (HOSPITAL_COMMUNITY)
Admission: RE | Admit: 2021-10-15 | Discharge: 2021-10-15 | Disposition: A | Discharge status: Home / Self Care | Payer: 59 | Source: Ambulatory Visit | Attending: Internal Medicine | Admitting: Internal Medicine

## 2021-10-15 DIAGNOSIS — A4101 Sepsis due to Methicillin susceptible Staphylococcus aureus: Secondary | ICD-10-CM | POA: Diagnosis not present

## 2021-10-15 DIAGNOSIS — N61 Mastitis without abscess: Secondary | ICD-10-CM | POA: Diagnosis not present

## 2021-10-15 DIAGNOSIS — N179 Acute kidney failure, unspecified: Secondary | ICD-10-CM | POA: Diagnosis not present

## 2021-10-15 DIAGNOSIS — N184 Chronic kidney disease, stage 4 (severe): Secondary | ICD-10-CM | POA: Diagnosis not present

## 2021-10-15 LAB — RENAL FUNCTION PANEL
Albumin: 2.3 g/dL — ABNORMAL LOW (ref 3.5–5.0)
Anion gap: 9 (ref 5–15)
BUN: 50 mg/dL — ABNORMAL HIGH (ref 6–20)
CO2: 24 mmol/L (ref 22–32)
Calcium: 7.8 mg/dL — ABNORMAL LOW (ref 8.9–10.3)
Chloride: 101 mmol/L (ref 98–111)
Creatinine, Ser: 4.85 mg/dL — ABNORMAL HIGH (ref 0.44–1.00)
GFR, Estimated: 12 mL/min — ABNORMAL LOW (ref >60)
Glucose, Bld: 123 mg/dL — ABNORMAL HIGH (ref 70–99)
Phosphorus: 3.7 mg/dL (ref 2.5–4.6)
Potassium: 3.7 mmol/L (ref 3.5–5.1)
Sodium: 134 mmol/L — ABNORMAL LOW (ref 135–145)

## 2021-10-15 LAB — CBC WITH DIFFERENTIAL/PLATELET
Abs Immature Granulocytes: 0.07 10*3/uL (ref 0.00–0.07)
Basophils Absolute: 0.1 10*3/uL (ref 0.0–0.1)
Basophils Relative: 1 %
Eosinophils Absolute: 0.3 10*3/uL (ref 0.0–0.5)
Eosinophils Relative: 2 %
HCT: 22.4 % — ABNORMAL LOW (ref 36.0–46.0)
Hemoglobin: 7.2 g/dL — ABNORMAL LOW (ref 12.0–15.0)
Immature Granulocytes: 1 %
Lymphocytes Relative: 11 %
Lymphs Abs: 1.6 10*3/uL (ref 0.7–4.0)
MCH: 28.1 pg (ref 26.0–34.0)
MCHC: 32.1 g/dL (ref 30.0–36.0)
MCV: 87.5 fL (ref 80.0–100.0)
Monocytes Absolute: 0.8 10*3/uL (ref 0.1–1.0)
Monocytes Relative: 6 %
Neutro Abs: 11.9 10*3/uL — ABNORMAL HIGH (ref 1.7–7.7)
Neutrophils Relative %: 79 %
Platelets: 85 10*3/uL — ABNORMAL LOW (ref 150–400)
RBC: 2.56 MIL/uL — ABNORMAL LOW (ref 3.87–5.11)
RDW: 16.2 % — ABNORMAL HIGH (ref 11.5–15.5)
WBC: 14.7 10*3/uL — ABNORMAL HIGH (ref 4.0–10.5)
nRBC: 0.1 % (ref 0.0–0.2)

## 2021-10-15 LAB — GLUCOSE, CAPILLARY
Glucose-Capillary: 136 mg/dL — ABNORMAL HIGH (ref 70–99)
Glucose-Capillary: 170 mg/dL — ABNORMAL HIGH (ref 70–99)
Glucose-Capillary: 192 mg/dL — ABNORMAL HIGH (ref 70–99)
Glucose-Capillary: 202 mg/dL — ABNORMAL HIGH (ref 70–99)
Glucose-Capillary: 79 mg/dL (ref 70–99)
Glucose-Capillary: 80 mg/dL (ref 70–99)

## 2021-10-15 LAB — HEPATITIS B SURFACE ANTIBODY, QUANTITATIVE: Hep B S AB Quant (Post): <3.1 m[IU]/mL — ABNORMAL LOW (ref >9.9)

## 2021-10-15 LAB — MAGNESIUM: Magnesium: 1.9 mg/dL (ref 1.7–2.4)

## 2021-10-15 MED ORDER — HEPARIN SODIUM (PORCINE) 1000 UNIT/ML IJ SOLN
INTRAMUSCULAR | Status: AC
  Administered 2021-10-15: 2400 [IU]
  Filled 2021-10-15: qty 3

## 2021-10-15 NOTE — Progress Notes (Signed)
Pre-treatment blood pressure

## 2021-10-15 NOTE — Progress Notes (Signed)
OT Cancellation Note  Patient Details Name: Lindsey Murillo MRN: [id number] DOB: Nov 25, 1995   Cancelled Treatment:    Reason Eval/Treat Not Completed: Patient at procedure or test/ unavailable  Joeseph Amor OTR/L  Acute Rehab Services  [phone] office number [phone] pager number  Joeseph Amor 10/15/2021, 11:03 AM

## 2021-10-15 NOTE — Progress Notes (Signed)
Patient ID: GREIDY SHERARD, female   DOB: 21-Jun-1995, 26 y.o.   MRN: [id number] S:Feels better today and admits to more energy after HD. O:BP 128/88 (BP Location: Left Arm)   Pulse (!) 101   Temp 98.4 F (36.9 C) (Oral)   Resp 17   Ht 5' 7.01" (1.702 m)   Wt 65 kg   SpO2 97%   BMI 22.44 kg/m  No intake or output data in the 24 hours ending 10/15/21 1053 Intake/Output: I/O last 3 completed shifts: In: 160 [P.O.:60; IV Piggyback:100] Out: 250 [Urine:250]  Intake/Output this shift:  No intake/output data recorded. Weight change: -9.9 kg Gen:NAD CVS: Tachy Resp: CTA Abd: +BS, soft, Nt/Nd Ext: 2+ pitting edema bilateral lower extremities  Recent Labs  Lab 10/09/21 0630 10/10/21 0534 10/11/21 0105 10/12/21 0336 10/13/21 0154 10/14/21 0222 10/15/21 0156  NA 125* 127* 126* 130* 131* 132* 134*  K 4.3 4.2 4.1 4.2 3.8 3.8 3.7  CL 95* 95* 95* 97* 100 97* 101  CO2 17* 19* 18* 20* 20* 23 24  GLUCOSE 90 113* 131* 94 121* 143* 123*  BUN 66* 68* 68* 69* 70* 72* 50*  CREATININE 6.00* 5.97* 5.91* 5.96* 6.13* 6.21* 4.85*  ALBUMIN 1.8* 2.0* 2.3* 2.7* 2.7* 2.6* 2.3*  CALCIUM 7.6* 7.7* 7.5* 8.1* 8.0* 8.2* 7.8*  PHOS 5.3* 5.2* 5.2* 5.3* 5.2* 5.2* 3.7  AST 11* 12* 11* 12*  --   --   --   ALT 6 <5 <5 <5  --   --   --    Liver Function Tests: Recent Labs  Lab 10/10/21 0534 10/11/21 0105 10/12/21 0336 10/13/21 0154 10/14/21 0222 10/15/21 0156  AST 12* 11* 12*  --   --   --   ALT <5 <5 <5  --   --   --   ALKPHOS 162* 151* 155*  --   --   --   BILITOT 0.6 0.5 0.5  --   --   --   PROT 4.6* 4.7* 5.3*  --   --   --   ALBUMIN 2.0* 2.3* 2.7* 2.7* 2.6* 2.3*   No results for input(s): "LIPASE", "AMYLASE" in the last 168 hours. No results for input(s): "AMMONIA" in the last 168 hours. CBC: Recent Labs  Lab 10/10/21 0534 10/11/21 0105 10/12/21 0336 10/13/21 2233 10/15/21 0156  WBC 14.3* 13.5* 13.4* 13.4* 14.7*  NEUTROABS 11.8* 10.8* 10.3*  --  11.9*  HGB 7.1* 6.5* 8.7* 7.2*  7.2*  HCT 21.4* 19.6* 25.6* 22.2* 22.4*  MCV 84.3 84.8 83.4 85.7 87.5  PLT 173 147* 136* 101* 85*   Cardiac Enzymes: No results for input(s): "CKTOTAL", "CKMB", "CKMBINDEX", "TROPONINI" in the last 168 hours. CBG: Recent Labs  Lab 10/14/21 1735 10/14/21 2052 10/15/21 0019 10/15/21 0547 10/15/21 0822  GLUCAP 158* 147* 202* 80 79    Iron Studies: No results for input(s): "IRON", "TIBC", "TRANSFERRIN", "FERRITIN" in the last 72 hours. Studies/Results: IR Fluoro Guide CV Line Right  Result Date: 10/14/2021 INDICATION: 26 year old female with renal failure requiring central venous access for hemodialysis. EXAM: NON-TUNNELED CENTRAL VENOUS HEMODIALYSIS CATHETER PLACEMENT WITH ULTRASOUND AND FLUOROSCOPIC GUIDANCE COMPARISON:  None Available. MEDICATIONS: None FLUOROSCOPY TIME:  One mGy COMPLICATIONS: None immediate. PROCEDURE: Informed written consent was obtained from the patient after a discussion of the risks, benefits, and alternatives to treatment. Questions regarding the procedure were encouraged and answered. The right neck and chest were prepped with chlorhexidine in a sterile fashion, and a sterile drape was applied  covering the operative field. Maximum barrier sterile technique with sterile gowns and gloves were used for the procedure. A timeout was performed prior to the initiation of the procedure. After the overlying soft tissues were anesthetized, a small venotomy incision was created and a micropuncture kit was utilized to access the internal jugular vein. Real-time ultrasound guidance was utilized for vascular access including the acquisition of a permanent ultrasound image documenting patency of the accessed vessel. The microwire was utilized to measure appropriate catheter length. A Rosen wire was advanced to the level of the IVC. Under fluoroscopic guidance, the venotomy was serially dilated, ultimately allowing placement of a 15 cm temporary Trialysis catheter with tip ultimately  terminating within the superior aspect of the right atrium. Final catheter positioning was confirmed and documented with a spot radiographic image. The catheter aspirates and flushes normally. The catheter was flushed with appropriate volume heparin dwells. The catheter exit site was secured with a 0-silk retention suture. A dressing was placed. The patient tolerated the procedure well without immediate post procedural complication. IMPRESSION: Successful placement of a right internal jugular approach 15 cm temporary dialysis catheter with tip terminating with in the superior aspect of the right atrium. The catheter is ready for immediate use. PLAN: This catheter may be converted to a tunneled dialysis catheter at a later date as indicated. Ruthann Cancer, MD [facility] Radiology Electronically Signed   By: Ruthann Cancer M.D.   On: 10/14/2021 10:49   IR US Guide Vasc Access Right  Result Date: 10/14/2021 INDICATION: 26 year old female with renal failure requiring central venous access for hemodialysis. EXAM: NON-TUNNELED CENTRAL VENOUS HEMODIALYSIS CATHETER PLACEMENT WITH ULTRASOUND AND FLUOROSCOPIC GUIDANCE COMPARISON:  None Available. MEDICATIONS: None FLUOROSCOPY TIME:  One mGy COMPLICATIONS: None immediate. PROCEDURE: Informed written consent was obtained from the patient after a discussion of the risks, benefits, and alternatives to treatment. Questions regarding the procedure were encouraged and answered. The right neck and chest were prepped with chlorhexidine in a sterile fashion, and a sterile drape was applied covering the operative field. Maximum barrier sterile technique with sterile gowns and gloves were used for the procedure. A timeout was performed prior to the initiation of the procedure. After the overlying soft tissues were anesthetized, a small venotomy incision was created and a micropuncture kit was utilized to access the internal jugular vein.  Real-time ultrasound guidance was utilized for vascular access including the acquisition of a permanent ultrasound image documenting patency of the accessed vessel. The microwire was utilized to measure appropriate catheter length. A Rosen wire was advanced to the level of the IVC. Under fluoroscopic guidance, the venotomy was serially dilated, ultimately allowing placement of a 15 cm temporary Trialysis catheter with tip ultimately terminating within the superior aspect of the right atrium. Final catheter positioning was confirmed and documented with a spot radiographic image. The catheter aspirates and flushes normally. The catheter was flushed with appropriate volume heparin dwells. The catheter exit site was secured with a 0-silk retention suture. A dressing was placed. The patient tolerated the procedure well without immediate post procedural complication. IMPRESSION: Successful placement of a right internal jugular approach 15 cm temporary dialysis catheter with tip terminating with in the superior aspect of the right atrium. The catheter is ready for immediate use. PLAN: This catheter may be converted to a tunneled dialysis catheter at a later date as indicated. Ruthann Cancer, MD [facility] Radiology Electronically Signed   By: Glade Nurse.D.  On: 10/14/2021 10:49    acetaminophen  650 mg Oral Q6H   calcium acetate  667 mg Oral TID WC   Chlorhexidine Gluconate Cloth  6 each Topical Daily   Chlorhexidine Gluconate Cloth  6 each Topical Q0600   collagenase   Topical Daily   darbepoetin (ARANESP) injection - NON-DIALYSIS  60 mcg Subcutaneous Q Wed-1800   doxycycline  100 mg Oral Q12H   feeding supplement (NEPRO CARB STEADY)  237 mL Oral BID BM   heparin  5,000 Units Subcutaneous Q8H   insulin aspart  0-6 Units Subcutaneous Q4H   magnesium oxide  400 mg Oral Daily   rosuvastatin  10 mg Oral Daily   sodium bicarbonate  1,300 mg Oral TID    tamsulosin  0.4 mg Oral QPC supper    BMET    Component Value Date/Time   NA 134 (L) 10/15/2021 0156   NA 132 (L) 09/24/2021 1453   K 3.7 10/15/2021 0156   CL 101 10/15/2021 0156   CO2 24 10/15/2021 0156   GLUCOSE 123 (H) 10/15/2021 0156   BUN 50 (H) 10/15/2021 0156   BUN 36 (H) 09/24/2021 1453   CREATININE 4.85 (H) 10/15/2021 0156   CREATININE 0.98 08/29/2019 1637   CALCIUM 7.8 (L) 10/15/2021 0156   GFRNONAA 12 (L) 10/15/2021 0156   GFRAA >60 10/27/2019 2021   CBC    Component Value Date/Time   WBC 14.7 (H) 10/15/2021 0156   RBC 2.56 (L) 10/15/2021 0156   HGB 7.2 (L) 10/15/2021 0156   HGB 9.5 (L) 09/24/2021 1453   HCT 22.4 (L) 10/15/2021 0156   HCT 28.1 (L) 09/24/2021 1453   PLT 85 (L) 10/15/2021 0156   PLT 248 09/24/2021 1453   MCV 87.5 10/15/2021 0156   MCV 83 09/24/2021 1453   MCH 28.1 10/15/2021 0156   MCHC 32.1 10/15/2021 0156   RDW 16.2 (H) 10/15/2021 0156   RDW 14.3 09/24/2021 1453   LYMPHSABS 1.6 10/15/2021 0156   MONOABS 0.8 10/15/2021 0156   EOSABS 0.3 10/15/2021 0156   BASOSABS 0.1 10/15/2021 0156      Assessment/Plan:   AKI/CKD stage IV - Presumably due to hemodynamic insults in setting of severe sepsis.  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  BUN/Scr continue to rise and still with significant edema.  No uremic symptoms, however I did discuss with her and her mother the need to initiate HD to help with her volume status.  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  Will need to plan for permanent vascular access after her infectious issues have been resolved.  Temp HD catheter placed by IR 10/13/21 and first HD session 10/14/21.  Will plan for second HD session today. Avoid nephrotoxic medications including NSAIDs and iodinated intravenous contrast exposure unless the latter is absolutely indicated.   Preferred narcotic agents for pain  control are hydromorphone, fentanyl, and methadone. Morphine should not be used.  Avoid Baclofen and avoid oral sodium phosphate and magnesium citrate based laxatives / bowel preps.  Continue strict Input and Output monitoring. Will monitor the patient closely with you and intervene or adjust therapy as indicated by changes in clinical status/labs  Will eventually need TDC and AVF/AVG placement once infection is under control. Will also need outpatient HD arrangements before discharge.  Severe sepsis due to posterior neck abscess - MSSA and currently receiving cefazolin, IV Right breast cellulitis - currently on antibiotics.  Reviewed  by surgery and nothing to drain. Hypervolemic hyponatremia - slowly improving with diuresis.  No symptoms. Nephrotic syndrome with wedema - on lasix 120 mg bid with IV albumin to improve response.  UF with HD as tolerated. HTN - stable Anemia of CKD stage IV.  Started ESA, transfuse for Hgb <7. Uncontrolled DM type 1 - per primary Severe protein malnutrition - per primary.   Donetta Potts, MD [facility]

## 2021-10-15 NOTE — Progress Notes (Signed)
Received patient in bed, alert and oriented. Informed consent signed and in chart.  Time tx initiated: 1104 Pre HD weight: 66.7 kg  Pre HD VS:  100 P 13 Resp 143/98  95%  Time tx completed:1415   HD treatment completed. Patient tolerated well. HD catheter without signs and symptoms of complications. Patient transported back to the room, alert and orient and in no acute distress. Report given to bedside RN.  Total UF removed: 2.5L  Post HD VS:  143/97 99% 94 P 19 Res[  Post HD weight: 65.6kg

## 2021-10-15 NOTE — Progress Notes (Signed)
PROGRESS NOTE    Lindsey Murillo  UXN:[id number] DOB: 06/25/1995 DOA: 10/01/2021 PCP: Coral Spikes, DO    Chief Complaint  Patient presents with   Leg Swelling    X 2 weeks     Brief Narrative:  Lindsey Murillo is a 26 y.o. female with past medical history significant for is a 26 year old female with past medical history significant for type 1 diabetes mellitus, stage IV CKD with nephrotic syndrome, anemia of chronic medical/renal disease, diabetic neuropathy, HTN who initially presented to [facility] ED on 5/25 with complaints of not feeling well, lower extremity edema, and posterior neck pain with associated drainage.  Mother at bedside assists with HPI given patient's resistance/evasive to questions.  Her mother reports that patient has not been feeling well for the last 2 weeks and noted a area of the posterior left neck that started draining 2 days prior.  Patient/mother denied any trauma to that region.  Pain is worse with any type of range of motion of the neck, nothing seems to make it better.  Mother put some ointment on it which offered her no relief.   In the ED, temperature 98.2 F, HR 102, RR 17, BP 96/67, SPO2 100% on room air.  Sodium 130, potassium 2.7, chloride 107, CO2 14, glucose 179, BUN 66, creatinine 4.76.  BNP 626.0.  WBC 38.9, hemoglobin 8.6, platelet count 330.  Lactic acid 0.7, procalcitonin 1.85.  INR 1.2.  Urinalysis with trace leukocytes, negative nitrite, rare bacteria, 11-20 WBCs.  Pregnancy test negative.  Chest x-ray with cardiomegaly, no active cardiopulmonary disease process.  CT soft tissue neck with area of phlegmon/abscess measuring 4 x 2.4 x 4.8 cm localized upper left neck with enlarged left upper cervical chain lymph nodes.  Blood cultures x2 were drawn.  Patient was started on vancomycin, cefepime, Flagyl.  Initially admitted to [facility].  Valuated by general surgery, Dr. Arnoldo Morale and requested transfer to a higher level of care due to  multiple comorbidities.  Case was discussed with ENT, Dr. Benjamine Mola; and location was out of scope of ENT and recommended transfer to [facility] for general surgery evaluation.  Patient was transferred to [facility] and admitted to the hospital service with general surgery in consultation.   She's now s/p I&D of L side of neck.  Surgery signed off, plan for 10-14 days abx.  R breast without evidence of abscess, plan for outpatient follow up, 10 days doxycycline.  Renal following for aki on CKD, tunneled HD cath placed today.      Assessment & Plan:  Principal Problem:   Sepsis ([place]) Active Problems:   Cellulitis of right breast   Uncontrolled type 1 diabetes mellitus with hyperglycemia ([name])   Pyuria   AKI (acute kidney injury) ([place])   Nephrotic syndrome   Hyponatremia   Peripheral edema   Hypokalemia   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 chronic kidney disease ([name])   Protein-calorie malnutrition, severe ([name])   Physical deconditioning   Hypomagnesemia   Broken tooth   Neck abscess   CKD (chronic kidney disease) stage 4, GFR 15-29 ml/min ([name])    Assessment and Plan: * Sepsis ([place]) Ruled in for sepsis at admission Due to L neck abscess CT with 4x2.4x4.8 cm phlegmon (limited evaluation due to absence of contrast), enlarged L upper chain cervical chain LN S/p I&D 5/28, per surgery -> wet to dry dressing changes BID, santyl to wound base Concern for R breast infection as well, no definite or  focal fluid collection noted on [place] MSSA on 5/28 culture Blood cultures ngtd Vancomycin 5/25 Cefepime 5/25-5/28 linezolid 5/27 - 5/30 Ancef 5/30 - present Narrowed to ancef, continue for now (will plan for at least 14 days, then reeval) Surgery recommending 10 days abx (doxycycline) for R breast White count continuing to downtrend gradually  Cellulitis of right breast US without definite or focal fluid collection Needs outpatient follow up, this has been arranged by surgery -> follow up with surgery and at  breast clinic Surgery recommended 10 days doxy  Pyuria UA with bacteria and yeast - no clear symptoms, suspect contaminant Follow culture only if symptomatic  Uncontrolled type 1 diabetes mellitus with hyperglycemia ([place]) Uncontrolled type I diabetic with a last hemoglobin A1c of 9.8 (09/24/2021). Basal insulin appears it was d/c'd due to hypoglycemia?  Hold cautiously for now (she was just on D5 IVF and still wasn't requiring much insulin) -CBG noted at 80 this morning. -SSI.   Hyponatremia Overloaded - improved with diuresis  Follow with diuresis  Nephrotic syndrome - Per nephrology.  AKI (acute kidney injury) ([place]) Creatinine 4.85 today - relatively stable with diuresis  Baseline ~3 Renal US negative for hydronephrosis.  Appears overloaded, LE edema appears to be improving gradually Renal consulted, appreciate assistance - temp HD cath placed  Diuresis with albumin given edema/nephrotic syndrome -Patient also underwent hemodialysis today per nephrology. -Per nephrology patient likely progressed to ESRD given biopsy results. -Patient currently receiving second hemodialysis session today. -Per nephrology will eventually need TEE DC and AVF/AVG placement once infection under control. -We will need outpatient HD arrangements prior to discharge. -Per nephrology.  Peripheral edema Diuresis with albumin for nephrotic syndrome Per nephrology.  Hypokalemia Repleted.   -On hemodialysis.   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 chronic kidney disease ([name]) Follow Elevated ferritin, low iron - normal folate, elevated b12 S/p 1 unit pRBC -Hemoglobin stable at 7.2. -Transfusion threshold hemoglobin < 7. -Repeat H&H in the AM.  Protein-calorie malnutrition, severe ([name]) - Continue nutritional supplementation.  Physical deconditioning PT  Hypomagnesemia Repleted.  Magnesium noted at 1.9   Broken tooth Upper right Pain related to this Continue abx for now  Needs dental f/u  outpatient         DVT prophylaxis: Heparin Code Status: Full Family Communication: Updated patient in HD Disposition: TBD  Status is: Inpatient Remains inpatient appropriate because: Severity of illness   Consultants:  General surgery: Dr. Donne Hazel 10/03/2021 Nephrology: Dr. Candiss Norse 10/07/2021 Wound care RN  Procedures:  CT soft tissue neck 10/02/2021 Chest x-ray 10/01/2021 Renal ultrasound 10/07/2021 Ultrasound of left breast 10/04/2021 Ultrasound of the head and neck 10/02/2021 Irrigation and debridement neck abscess by Dr. Rosendo Gros 10/04/2021/general surgery    Antimicrobials:  Anti-infectives (From admission, onward)    Start     Dose/Rate Route Frequency Ordered Stop   10/13/21 2200  doxycycline (VIBRA-TABS) tablet 100 mg        100 mg Oral Every 12 hours 10/13/21 1719 10/23/21 2159   10/06/21 2200  ceFAZolin (ANCEF) IVPB 1 g/50 mL premix        1 g 100 mL/hr over 30 Minutes Intravenous Every 12 hours 10/06/21 1052     10/04/21 2200  ceFEPIme (MAXIPIME) 1 g in sodium chloride 0.9 % 100 mL IVPB  Status:  Discontinued        1 g 200 mL/hr over 30 Minutes Intravenous Every 24 hours 10/04/21 1011 10/05/21 1343   10/03/21 2200  vancomycin (VANCOREADY) IVPB 750 mg/150 mL  Status:  Discontinued        750 mg 150 mL/hr over 60 Minutes Intravenous Every 48 hours 10/02/21 0033 10/02/21 1144   10/03/21 1000  linezolid (ZYVOX) IVPB 600 mg  Status:  Discontinued        600 mg 300 mL/hr over 60 Minutes Intravenous Every 12 hours 10/02/21 1144 10/06/21 1052   10/02/21 2200  ceFEPIme (MAXIPIME) 2 g in sodium chloride 0.9 % 100 mL IVPB  Status:  Discontinued        2 g 200 mL/hr over 30 Minutes Intravenous Every 24 hours 10/02/21 0033 10/04/21 1011   10/02/21 0000  ceFEPIme (MAXIPIME) 2 g in sodium chloride 0.9 % 100 mL IVPB        2 g 200 mL/hr over 30 Minutes Intravenous  Once 10/01/21 2358 10/02/21 0246   10/02/21 0000  metroNIDAZOLE (FLAGYL) IVPB 500 mg        500 mg 100 mL/hr  over 60 Minutes Intravenous  Once 10/01/21 2358 10/02/21 0246   10/02/21 0000  vancomycin (VANCOCIN) IVPB 1000 mg/200 mL premix        1,000 mg 200 mL/hr over 60 Minutes Intravenous  Once 10/01/21 2358 10/02/21 0244         Subjective: In hemodialysis.  On the phone with her grandmother.  Denies any chest pain.  No shortness of breath.  No abdominal pain.  Stated ambulated around the whole unit today.    Objective: Vitals:   10/15/21 1244 10/15/21 1435 10/15/21 1444 10/15/21 1702  BP: (!) 138/99  (!) 143/97 107/76  Pulse: 96  94 (!) 101  Resp: (!) 22  19 18   Temp:    98.5 F (36.9 C)  TempSrc:    Oral  SpO2: 97%  99% 99%  Weight:  63.5 kg    Height:       No intake or output data in the 24 hours ending 10/15/21 1709  Filed Weights   10/14/21 1149 10/15/21 1104 10/15/21 1435  Weight: 65 kg 66.7 kg 63.5 kg    Examination:  General exam: Appears calm and comfortable .  Left neck wound stable. Respiratory system: CTA B anterior lung fields.  No wheezes, no crackles, no rhonchi.  Fair air movement.  Speaking in full sentences. Cardiovascular system: Regular rate rhythm no murmurs rubs or gallops.  No JVD.  No lower extremity edema.  Gastrointestinal system: Abdomen is nondistended, soft and nontender. No organomegaly or masses felt. Normal bowel sounds heard. Central nervous system: Alert and oriented. No focal neurological deficits. Extremities: Symmetric 5 x 5 power. Skin: No rashes, lesions or ulcers Psychiatry: Judgement and insight appear normal. Mood & affect appropriate.     Data Reviewed:   CBC: Recent Labs  Lab 10/09/21 0630 10/10/21 0534 10/11/21 0105 10/12/21 0336 10/13/21 2233 10/15/21 0156  WBC 21.4* 14.3* 13.5* 13.4* 13.4* 14.7*  NEUTROABS 18.8* 11.8* 10.8* 10.3*  --  11.9*  HGB 7.2* 7.1* 6.5* 8.7* 7.2* 7.2*  HCT 21.7* 21.4* 19.6* 25.6* 22.2* 22.4*  MCV 84.4 84.3 84.8 83.4 85.7 87.5  PLT 208 173 147* 136* 101* 85*    Basic Metabolic  Panel: Recent Labs  Lab 10/09/21 0630 10/10/21 0534 10/11/21 0105 10/12/21 0336 10/13/21 0154 10/14/21 0222 10/15/21 0156  NA 125* 127* 126* 130* 131* 132* 134*  K 4.3 4.2 4.1 4.2 3.8 3.8 3.7  CL 95* 95* 95* 97* 100 97* 101  CO2 17* 19* 18* 20* 20* 23 24  GLUCOSE 90 113* 131* 94 121*  143* 123*  BUN 66* 68* 68* 69* 70* 72* 50*  CREATININE 6.00* 5.97* 5.91* 5.96* 6.13* 6.21* 4.85*  CALCIUM 7.6* 7.7* 7.5* 8.1* 8.0* 8.2* 7.8*  MG 1.5* 1.7 1.8 1.9  --   --  1.9  PHOS 5.3* 5.2* 5.2* 5.3* 5.2* 5.2* 3.7    GFR: Estimated Creatinine Clearance: 16.9 mL/min (A) (by C-G formula based on SCr of 4.85 mg/dL (H)).  Liver Function Tests: Recent Labs  Lab 10/09/21 0630 10/10/21 0534 10/11/21 0105 10/12/21 0336 10/13/21 0154 10/14/21 0222 10/15/21 0156  AST 11* 12* 11* 12*  --   --   --   ALT 6 <5 <5 <5  --   --   --   ALKPHOS 164* 162* 151* 155*  --   --   --   BILITOT 0.6 0.6 0.5 0.5  --   --   --   PROT 4.5* 4.6* 4.7* 5.3*  --   --   --   ALBUMIN 1.8* 2.0* 2.3* 2.7* 2.7* 2.6* 2.3*    CBG: Recent Labs  Lab 10/14/21 2052 10/15/21 0019 10/15/21 0547 10/15/21 0822 10/15/21 1511  GLUCAP 147* 202* 80 79 136*     No results found for this or any previous visit (from the past 240 hour(s)).       Radiology Studies: No results found.      Scheduled Meds:  acetaminophen  650 mg Oral Q6H   calcium acetate  667 mg Oral TID WC   Chlorhexidine Gluconate Cloth  6 each Topical Daily   Chlorhexidine Gluconate Cloth  6 each Topical Q0600   collagenase   Topical Daily   darbepoetin (ARANESP) injection - NON-DIALYSIS  60 mcg Subcutaneous Q Wed-1800   doxycycline  100 mg Oral Q12H   feeding supplement (NEPRO CARB STEADY)  237 mL Oral BID BM   heparin  5,000 Units Subcutaneous Q8H   insulin aspart  0-6 Units Subcutaneous Q4H   magnesium oxide  400 mg Oral Daily   rosuvastatin  10 mg Oral Daily   sodium bicarbonate  1,300 mg Oral TID   tamsulosin  0.4 mg Oral QPC supper    Continuous Infusions:   ceFAZolin (ANCEF) IV 1 g (10/15/21 1019)   furosemide 120 mg (10/15/21 1531)     LOS: 13 days    Time spent: 35 minutes    Irine Seal, MD [facility]   To contact the attending provider between 7A-7P or the covering provider during after hours 7P-7A, please log into the web site [email or web address] and access using universal [place] password for that web site. If you do not have the password, please call the hospital operator.  10/15/2021, 5:09 PM

## 2021-10-15 NOTE — Plan of Care (Signed)
  Problem: Education: Goal: Knowledge of General Education information will improve Description: Including pain rating scale, medication(s)/side effects and non-pharmacologic comfort measures Outcome: Progressing   Problem: Health Behavior/Discharge Planning: Goal: Ability to manage health-related needs will improve Outcome: Progressing   Problem: Clinical Measurements: Goal: Ability to maintain clinical measurements within normal limits will improve Outcome: Progressing Goal: Will remain free from infection Outcome: Progressing Goal: Diagnostic test results will improve Outcome: Progressing   Problem: Activity: Goal: Risk for activity intolerance will decrease Outcome: Progressing   Problem: Coping: Goal: Level of anxiety will decrease Outcome: Progressing   Problem: Pain Managment: Goal: General experience of comfort will improve Outcome: Progressing   Problem: Education: Goal: Ability to describe self-care measures that may prevent or decrease complications (Diabetes Survival Skills Education) will improve Outcome: Progressing Goal: Individualized Educational Video(s) Outcome: Progressing

## 2021-10-16 DIAGNOSIS — A4101 Sepsis due to Methicillin susceptible Staphylococcus aureus: Secondary | ICD-10-CM | POA: Diagnosis not present

## 2021-10-16 DIAGNOSIS — N184 Chronic kidney disease, stage 4 (severe): Secondary | ICD-10-CM | POA: Diagnosis not present

## 2021-10-16 DIAGNOSIS — N179 Acute kidney failure, unspecified: Secondary | ICD-10-CM | POA: Diagnosis not present

## 2021-10-16 DIAGNOSIS — N61 Mastitis without abscess: Secondary | ICD-10-CM | POA: Diagnosis not present

## 2021-10-16 LAB — RENAL FUNCTION PANEL
Albumin: 2.3 g/dL — ABNORMAL LOW (ref 3.5–5.0)
Anion gap: 6 (ref 5–15)
BUN: 38 mg/dL — ABNORMAL HIGH (ref 6–20)
CO2: 27 mmol/L (ref 22–32)
Calcium: 8 mg/dL — ABNORMAL LOW (ref 8.9–10.3)
Chloride: 104 mmol/L (ref 98–111)
Creatinine, Ser: 3.97 mg/dL — ABNORMAL HIGH (ref 0.44–1.00)
GFR, Estimated: 15 mL/min — ABNORMAL LOW
Glucose, Bld: 107 mg/dL — ABNORMAL HIGH (ref 70–99)
Phosphorus: 3.3 mg/dL (ref 2.5–4.6)
Potassium: 4 mmol/L (ref 3.5–5.1)
Sodium: 137 mmol/L (ref 135–145)

## 2021-10-16 LAB — CBC
HCT: 21.7 % — ABNORMAL LOW (ref 36.0–46.0)
Hemoglobin: 7 g/dL — ABNORMAL LOW (ref 12.0–15.0)
MCH: 28.7 pg (ref 26.0–34.0)
MCHC: 32.3 g/dL (ref 30.0–36.0)
MCV: 88.9 fL (ref 80.0–100.0)
Platelets: 87 K/uL — ABNORMAL LOW (ref 150–400)
RBC: 2.44 MIL/uL — ABNORMAL LOW (ref 3.87–5.11)
RDW: 16.4 % — ABNORMAL HIGH (ref 11.5–15.5)
WBC: 14.6 K/uL — ABNORMAL HIGH (ref 4.0–10.5)
nRBC: 0 % (ref 0.0–0.2)

## 2021-10-16 LAB — GLUCOSE, CAPILLARY
Glucose-Capillary: 107 mg/dL — ABNORMAL HIGH (ref 70–99)
Glucose-Capillary: 165 mg/dL — ABNORMAL HIGH (ref 70–99)
Glucose-Capillary: 192 mg/dL — ABNORMAL HIGH (ref 70–99)
Glucose-Capillary: 224 mg/dL — ABNORMAL HIGH (ref 70–99)
Glucose-Capillary: 229 mg/dL — ABNORMAL HIGH (ref 70–99)
Glucose-Capillary: 99 mg/dL (ref 70–99)

## 2021-10-16 MED ORDER — CEFAZOLIN SODIUM-DEXTROSE 2-4 GM/100ML-% IV SOLN: 2.0000 g | INTRAVENOUS | Status: DC

## 2021-10-16 MED ORDER — INSULIN ASPART 100 UNIT/ML IJ SOLN
0.0000 [IU] | Freq: Three times a day (TID) | INTRAMUSCULAR | Status: DC
  Administered 2021-10-17 – 2021-10-20 (×2): 1 [IU] via SUBCUTANEOUS

## 2021-10-16 NOTE — Progress Notes (Addendum)
    Inpatient Rehabilitation Admissions Coordinator   Patient functionally has progressed well after initiation of hemodialysis. Likely to not need CIR rehab at discharge. I will follow at a distance.  Danne Baxter, RN, MSN Rehab Admissions Coordinator [phone] 10/16/2021 8:25 AM

## 2021-10-16 NOTE — Progress Notes (Signed)
PROGRESS NOTE    Lindsey Murillo  UXN:[id number] DOB: 06/25/1995 DOA: 10/01/2021 PCP: Coral Spikes, DO    Chief Complaint  Patient presents with   Leg Swelling    X 2 weeks     Brief Narrative:  Lindsey Murillo is a 26 y.o. female with past medical history significant for is a 26 year old female with past medical history significant for type 1 diabetes mellitus, stage IV CKD with nephrotic syndrome, anemia of chronic medical/renal disease, diabetic neuropathy, HTN who initially presented to [facility] ED on 5/25 with complaints of not feeling well, lower extremity edema, and posterior neck pain with associated drainage.  Mother at bedside assists with HPI given patient's resistance/evasive to questions.  Her mother reports that patient has not been feeling well for the last 2 weeks and noted a area of the posterior left neck that started draining 2 days prior.  Patient/mother denied any trauma to that region.  Pain is worse with any type of range of motion of the neck, nothing seems to make it better.  Mother put some ointment on it which offered her no relief.   In the ED, temperature 98.2 F, HR 102, RR 17, BP 96/67, SPO2 100% on room air.  Sodium 130, potassium 2.7, chloride 107, CO2 14, glucose 179, BUN 66, creatinine 4.76.  BNP 626.0.  WBC 38.9, hemoglobin 8.6, platelet count 330.  Lactic acid 0.7, procalcitonin 1.85.  INR 1.2.  Urinalysis with trace leukocytes, negative nitrite, rare bacteria, 11-20 WBCs.  Pregnancy test negative.  Chest x-ray with cardiomegaly, no active cardiopulmonary disease process.  CT soft tissue neck with area of phlegmon/abscess measuring 4 x 2.4 x 4.8 cm localized upper left neck with enlarged left upper cervical chain lymph nodes.  Blood cultures x2 were drawn.  Patient was started on vancomycin, cefepime, Flagyl.  Initially admitted to [facility].  Valuated by general surgery, Dr. Arnoldo Morale and requested transfer to a higher level of care due to  multiple comorbidities.  Case was discussed with ENT, Dr. Benjamine Mola; and location was out of scope of ENT and recommended transfer to [facility] for general surgery evaluation.  Patient was transferred to [facility] and admitted to the hospital service with general surgery in consultation.   She's now s/p I&D of L side of neck.  Surgery signed off, plan for 10-14 days abx.  R breast without evidence of abscess, plan for outpatient follow up, 10 days doxycycline.  Renal following for aki on CKD, tunneled HD cath placed today.      Assessment & Plan:  Principal Problem:   Sepsis ([place]) Active Problems:   Cellulitis of right breast   Uncontrolled type 1 diabetes mellitus with hyperglycemia ([name])   Pyuria   AKI (acute kidney injury) ([place])   Nephrotic syndrome   Hyponatremia   Peripheral edema   Hypokalemia   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 chronic kidney disease ([name])   Protein-calorie malnutrition, severe ([name])   Physical deconditioning   Hypomagnesemia   Broken tooth   Neck abscess   CKD (chronic kidney disease) stage 4, GFR 15-29 ml/min ([name])    Assessment and Plan: * Sepsis ([place]) Ruled in for sepsis at admission Due to L neck abscess CT with 4x2.4x4.8 cm phlegmon (limited evaluation due to absence of contrast), enlarged L upper chain cervical chain LN S/p I&D 5/28, per surgery -> wet to dry dressing changes BID, santyl to wound base Concern for R breast infection as well, no definite or  focal fluid collection noted on [place] MSSA on 5/28 culture Blood cultures ngtd Vancomycin 5/25 Cefepime 5/25-5/28 linezolid 5/27 - 5/30 Ancef 5/30 - present Narrowed to ancef, continue for now (will plan for at least 14 days, then reeval) Surgery recommending 10 days abx (doxycycline) for R breast Leukocytosis slowly trending down and now seems to be fluctuating.  Cellulitis of right breast US without definite or focal fluid collection Needs outpatient follow up, this has been arranged by surgery -> follow up  with surgery and at breast clinic Surgery recommended 10 days doxy  Pyuria UA with bacteria and yeast - no clear symptoms, suspect contaminant Follow culture only if symptomatic  Uncontrolled type 1 diabetes mellitus with hyperglycemia ([place]) Uncontrolled type I diabetic with a last hemoglobin A1c of 9.8 (09/24/2021). Basal insulin appears it was d/c'd due to hypoglycemia?  Hold cautiously for now (she was just on D5 IVF and still wasn't requiring much insulin) -CBG noted at 99 this morning.  -SSI.   Hyponatremia Overloaded - improved with diuresis  Follow with diuresis  Nephrotic syndrome - On albumin and IV Lasix as well as started on hemodialysis.  Per nephrology.  AKI (acute kidney injury) ([place]) Creatinine 4.85 today - relatively stable with diuresis  Baseline ~3 Renal US negative for hydronephrosis.  Appearred overloaded early on in the hospitalization, LE edema appears to be improving gradually with diuresis and HD. Renal consulted, appreciate assistance - temp HD cath placed  Diuresis with albumin given edema/nephrotic syndrome -Patient also underwent hemodialysis on 10/14/2021, 10/15/2021 per nephrology. 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 -Per nephrology will eventually need TEE DC and AVF/AVG placement once infection under control. -Patient assessed by interventional radiology for placement of dialysis catheter to be planned for Monday, 10/19/2021. -We will need outpatient HD arrangements prior to discharge. -Per nephrology.  Peripheral edema Diuresis with albumin for nephrotic syndrome Per nephrology.  Hypokalemia Repleted.   -On hemodialysis.   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 chronic kidney disease ([name]) Follow Elevated ferritin, low iron - normal folate, elevated b12 S/p 1 unit pRBC -Hemoglobin stable at 7.0. -Transfusion threshold hemoglobin < 7. -Repeat H&H in the AM.  Protein-calorie malnutrition, severe ([name]) - Continue nutritional  supplementation.  Physical deconditioning PT  Hypomagnesemia Repleted.  Magnesium noted at 1.9   Broken tooth Upper right Pain related to this Continue abx for now  Needs dental f/u outpatient         DVT prophylaxis: Heparin Code Status: Full Family Communication: Updated patient, no family at bedside. Disposition: TBD  Status is: Inpatient Remains inpatient appropriate because: Severity of illness   Consultants:  General surgery: Dr. Donne Hazel 10/03/2021 Nephrology: Dr. Candiss Norse 10/07/2021 Wound care RN  Procedures:  CT soft tissue neck 10/02/2021 Chest x-ray 10/01/2021 Renal ultrasound 10/07/2021 Ultrasound of left breast 10/04/2021 Ultrasound of the head and neck 10/02/2021 Irrigation and debridement neck abscess by Dr. Rosendo Gros 10/04/2021/general surgery    Antimicrobials:  Anti-infectives (From admission, onward)    Start     Dose/Rate Route Frequency Ordered Stop   10/19/21 0600  ceFAZolin (ANCEF) IVPB 2g/100 mL premix        2 g 200 mL/hr over 30 Minutes Intravenous To Radiology 10/16/21 1125 10/20/21 0600   10/13/21 2200  doxycycline (VIBRA-TABS) tablet 100 mg        100 mg Oral Every 12 hours 10/13/21 1719 10/23/21 2159   10/06/21 2200  ceFAZolin (ANCEF) IVPB 1 g/50 mL premix  1 g 100 mL/hr over 30 Minutes Intravenous Every 12 hours 10/06/21 1052     10/04/21 2200  ceFEPIme (MAXIPIME) 1 g in sodium chloride 0.9 % 100 mL IVPB  Status:  Discontinued        1 g 200 mL/hr over 30 Minutes Intravenous Every 24 hours 10/04/21 1011 10/05/21 1343   10/03/21 2200  vancomycin (VANCOREADY) IVPB 750 mg/150 mL  Status:  Discontinued        750 mg 150 mL/hr over 60 Minutes Intravenous Every 48 hours 10/02/21 0033 10/02/21 1144   10/03/21 1000  linezolid (ZYVOX) IVPB 600 mg  Status:  Discontinued        600 mg 300 mL/hr over 60 Minutes Intravenous Every 12 hours 10/02/21 1144 10/06/21 1052   10/02/21 2200  ceFEPIme (MAXIPIME) 2 g in sodium chloride 0.9 % 100 mL  IVPB  Status:  Discontinued        2 g 200 mL/hr over 30 Minutes Intravenous Every 24 hours 10/02/21 0033 10/04/21 1011   10/02/21 0000  ceFEPIme (MAXIPIME) 2 g in sodium chloride 0.9 % 100 mL IVPB        2 g 200 mL/hr over 30 Minutes Intravenous  Once 10/01/21 2358 10/02/21 0246   10/02/21 0000  metroNIDAZOLE (FLAGYL) IVPB 500 mg        500 mg 100 mL/hr over 60 Minutes Intravenous  Once 10/01/21 2358 10/02/21 0246   10/02/21 0000  vancomycin (VANCOCIN) IVPB 1000 mg/200 mL premix        1,000 mg 200 mL/hr over 60 Minutes Intravenous  Once 10/01/21 2358 10/02/21 0244         Subjective: States she is getting a break from hemodialysis today.  Denies any chest pain.  No shortness of breath.  No abdominal pain.  Tolerating current diet.   Objective: Vitals:   10/16/21 0843 10/16/21 1220 10/16/21 1816 10/16/21 1952  BP: (!) 133/93 (!) 134/91 (!) 152/99 (!) 158/104  Pulse: (!) 105 96 (!) 101 (!) 103  Resp: 17 17 17 18   Temp: 98.1 F (36.7 C) 98.9 F (37.2 C) 98.1 F (36.7 C) 97.8 F (36.6 C)  TempSrc: Oral Oral Oral Oral  SpO2: 98% 97% 99% 99%  Weight:      Height:        Intake/Output Summary (Last 24 hours) at 10/16/2021 1954 Last data filed at 10/16/2021 1900 Gross per 24 hour  Intake 1380 ml  Output --  Net 1380 ml    Filed Weights   10/15/21 1104 10/15/21 1435 10/16/21 0500  Weight: 66.7 kg 63.5 kg 65.7 kg    Examination:  General exam: NAD.  Left neck wound stable. Respiratory system: Lungs clear to auscultation bilaterally.  No wheezes, no crackles, no rhonchi.  Fair air movement.  Speaking in full sentences.  Cardiovascular system: RRR no murmurs rubs or gallops.  No JVD.  1+ bilateral lower extremity edema. Gastrointestinal system: Abdomen is nondistended, soft and nontender. No organomegaly or masses felt. Normal bowel sounds heard. Central nervous system: Alert and oriented. No focal neurological deficits. Extremities: 1+ bilateral lower extremity edema.   Symmetric 5 x 5 power. Skin: No rashes, lesions or ulcers Psychiatry: Judgement and insight appear normal. Mood & affect appropriate.     Data Reviewed:   CBC: Recent Labs  Lab 10/10/21 0534 10/11/21 0105 10/12/21 0336 10/13/21 2233 10/15/21 0156 10/16/21 0811  WBC 14.3* 13.5* 13.4* 13.4* 14.7* 14.6*  NEUTROABS 11.8* 10.8* 10.3*  --  11.9*  --  HGB 7.1* 6.5* 8.7* 7.2* 7.2* 7.0*  HCT 21.4* 19.6* 25.6* 22.2* 22.4* 21.7*  MCV 84.3 84.8 83.4 85.7 87.5 88.9  PLT 173 147* 136* 101* 85* 87*    Basic Metabolic Panel: Recent Labs  Lab 10/10/21 0534 10/11/21 0105 10/12/21 0336 10/13/21 0154 10/14/21 0222 10/15/21 0156 10/16/21 0811  NA 127* 126* 130* 131* 132* 134* 137  K 4.2 4.1 4.2 3.8 3.8 3.7 4.0  CL 95* 95* 97* 100 97* 101 104  CO2 19* 18* 20* 20* 23 24 27   GLUCOSE 113* 131* 94 121* 143* 123* 107*  BUN 68* 68* 69* 70* 72* 50* 38*  CREATININE 5.97* 5.91* 5.96* 6.13* 6.21* 4.85* 3.97*  CALCIUM 7.7* 7.5* 8.1* 8.0* 8.2* 7.8* 8.0*  MG 1.7 1.8 1.9  --   --  1.9  --   PHOS 5.2* 5.2* 5.3* 5.2* 5.2* 3.7 3.3    GFR: Estimated Creatinine Clearance: 20.7 mL/min (A) (by C-G formula based on SCr of 3.97 mg/dL (H)).  Liver Function Tests: Recent Labs  Lab 10/10/21 0534 10/11/21 0105 10/12/21 0336 10/13/21 0154 10/14/21 0222 10/15/21 0156 10/16/21 0811  AST 12* 11* 12*  --   --   --   --   ALT <5 <5 <5  --   --   --   --   ALKPHOS 162* 151* 155*  --   --   --   --   BILITOT 0.6 0.5 0.5  --   --   --   --   PROT 4.6* 4.7* 5.3*  --   --   --   --   ALBUMIN 2.0* 2.3* 2.7* 2.7* 2.6* 2.3* 2.3*    CBG: Recent Labs  Lab 10/16/21 0524 10/16/21 0846 10/16/21 1222 10/16/21 1747 10/16/21 1949  GLUCAP 165* 99 107* 224* 192*     No results found for this or any previous visit (from the past 240 hour(s)).       Radiology Studies: No results found.      Scheduled Meds:  acetaminophen  650 mg Oral Q6H   calcium acetate  667 mg Oral TID WC   Chlorhexidine  Gluconate Cloth  6 each Topical Daily   Chlorhexidine Gluconate Cloth  6 each Topical Q0600   collagenase   Topical Daily   darbepoetin (ARANESP) injection - NON-DIALYSIS  60 mcg Subcutaneous Q Wed-1800   doxycycline  100 mg Oral Q12H   feeding supplement (NEPRO CARB STEADY)  237 mL Oral BID BM   heparin  5,000 Units Subcutaneous Q8H   insulin aspart  0-6 Units Subcutaneous Q4H   magnesium oxide  400 mg Oral Daily   rosuvastatin  10 mg Oral Daily   sodium bicarbonate  1,300 mg Oral TID   tamsulosin  0.4 mg Oral QPC supper   Continuous Infusions:   ceFAZolin (ANCEF) IV 1 g (10/16/21 0942)   [START ON 10/19/2021]  ceFAZolin (ANCEF) IV     furosemide 120 mg (10/16/21 1738)     LOS: 14 days    Time spent: 35 minutes    Irine Seal, MD [facility]   To contact the attending provider between 7A-7P or the covering provider during after hours 7P-7A, please log into the web site [email or web address] and access using universal [place] password for that web site. If you do not have the password, please call the hospital operator.  10/16/2021, 7:54 PM

## 2021-10-16 NOTE — Progress Notes (Signed)
Out Patient Arrangements:  Have been requested to arrange out pt HD for pt. Have submitted medical records to [place]. Please advise of d/c date.   Linus Orn [facility] [phone]

## 2021-10-16 NOTE — Consult Note (Signed)
Chief Complaint: Patient was seen in consultation today for tunneled dialysis catheter placement at the request of Gillie Manners MD Chief Complaint  Patient presents with   Leg Swelling    X 2 weeks       Supervising Physician: Daryll Brod  Patient Status: [facility] - In-pt  History of Present Illness: Lindsey Murillo is a 26 y.o. female with PMH significant for CKD Stage IV seen today by IR for evaluation of tunneled dialysis catheter. Known to IR for placement of temporary dialysis catheter on 10/13/21.  Tmax today of 99.4; WBC of 14.6 Secondary known values, will plan for tunneled dialysis catheter placement on Monday 6/12  Past Medical History:  Diagnosis Date   Acanthosis nigricans, acquired    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 chronic kidney disease ([place]) 09/25/2021   Asthma    Diabetic autonomic neuropathy ([place])    Diabetic peripheral neuropathy ([place])    Environmental allergies    Goiter    Hypoglycemia associated with diabetes ([place])    Tachycardia    Thyroiditis, autoimmune    Type 1 diabetes mellitus in patient age 63-19 years with HbA1C goal below 7.5     Past Surgical History:  Procedure Laterality Date   BIOPSY  08/27/2016   Procedure: BIOPSY;  Surgeon: Danie Binder, MD;  [facility];  Service: Endoscopy;;  duodenum; gastric   BIOPSY  06/27/2019   Procedure: BIOPSY;  Surgeon: Daneil Dolin, MD;  [facility];  Service: Endoscopy;;   COLONOSCOPY     ESOPHAGOGASTRODUODENOSCOPY N/A 08/27/2016   Dr. Oneida Alar: mild gastritis. Negative celiac. No obvious source for dyspepsia/diarrhea   ESOPHAGOGASTRODUODENOSCOPY (EGD) WITH PROPOFOL N/A 06/27/2019   rourk: Focal abnormality of the gastric mucosa likely due to trauma (heaving).  Biopsy showed mild gastritis, negative for H. pylori.  Esophageal dilation for history of dysphagia but normal-appearing esophagus.   IR FLUORO GUIDE CV LINE RIGHT  10/13/2021   IR US GUIDE VASC ACCESS RIGHT  10/13/2021   IRRIGATION AND  DEBRIDEMENT ABSCESS N/A 10/04/2021   Procedure: IRRIGATION AND DEBRIDEMENT NECK ABSCESS;  Surgeon: Ralene Ok, MD;  Location: [facility];  Service: General;  Laterality: N/A;    Allergies: Patient has no known allergies.  Medications: Prior to Admission medications   Medication Sig Start Date End Date Taking? Authorizing Provider  acetaminophen (TYLENOL) 325 MG tablet Take 650 mg by mouth every 6 (six) hours as needed for moderate pain (for pain).   Yes [provider]  metoprolol tartrate (LOPRESSOR) 50 MG tablet Take 50 mg by mouth 2 (two) times daily.   Yes [provider]  rosuvastatin (CRESTOR) 10 MG tablet Take 1 tablet (10 mg total) by mouth daily. 06/01/21  Yes Cook, Jayce G, DO  torsemide (DEMADEX) 20 MG tablet Take 60 mg by mouth 2 (two) times daily. 07/06/21  Yes [provider]  vitamin B-12 (CYANOCOBALAMIN) 1000 MCG tablet Take 1,000 mcg by mouth daily.   Yes [provider]  blood glucose meter kit and supplies KIT Dispense based on patient and insurance preference. Use up to four times daily as directed. 05/26/21   Coral Spikes, DO  Continuous Blood Gluc Receiver (FREESTYLE LIBRE 2 READER) DEVI As directed 06/02/21   Cassandria Anger, MD  Continuous Blood Gluc Sensor (FREESTYLE LIBRE 2 SENSOR) MISC 1 Piece by Does not apply route every 14 (fourteen) days. 06/02/21   Cassandria Anger, MD  feeding supplement (ENSURE ENLIVE / ENSURE PLUS) LIQD Take  237 mLs by mouth 2 (two) times daily between meals. Patient not taking: Reported on 10/02/2021 06/23/21   Orson Eva, MD  insulin glargine-yfgn (SEMGLEE) 100 UNIT/ML injection 12 Units daily. Patient not taking: Reported on 10/02/2021 05/26/21   [provider]  Insulin Pen Needle 31G X 4 MM MISC Use as directed to administer insulin. 05/26/21   Coral Spikes, DO  polyethylene glycol-electrolytes (TRILYTE) 420 g solution Take 4,000 mLs by mouth as directed. Patient not taking: Reported on  10/02/2021 09/24/21   Eloise Harman, DO  potassium chloride (KLOR-CON) 20 MEQ packet Take 40 mEq by mouth daily. Patient not taking: Reported on 10/02/2021 06/23/21   Orson Eva, MD  sulfamethoxazole-trimethoprim (BACTRIM DS) 800-160 MG tablet Take 1 tablet by mouth 2 (two) times daily. Patient not taking: Reported on 10/02/2021 08/11/21   [provider]     Family History  Problem Relation Age of Onset   Diabetes Mother        Type II DM   Thyroid disease Mother    Diabetes Maternal Grandmother        Type II DM   Diabetes Cousin        Type II DM   Colon cancer Neg Hx    Colon polyps Neg Hx     Social History   Socioeconomic History   Marital status: Single    Spouse name: Not on file   Number of children: 0   Years of education: Not on file   Highest education level: High school graduate  Occupational History   Occupation: unemployed  Tobacco Use   Smoking status: Never   Smokeless tobacco: Never  Vaping Use   Vaping Use: Never used  Substance and Sexual Activity   Alcohol use: No   Drug use: No   Sexual activity: Never    Birth control/protection: Abstinence  Other Topics Concern   Not on file  Social History Narrative   Not on file   Social Determinants of Health   Financial Resource Strain: Not on file  Food Insecurity: No Food Insecurity (07/02/2019)   Hunger Vital Sign    Worried About Running Out of Food in the Last Year: Never true    Ran Out of Food in the Last Year: Never true  Transportation Needs: No Transportation Needs (07/02/2019)   PRAPARE - Hydrologist (Medical): No    Lack of Transportation (Non-Medical): No  Physical Activity: Not on file  Stress: Not on file  Social Connections: Moderately Integrated (07/02/2019)   Social Connection and Isolation Panel [NHANES]    Frequency of Communication with Friends and Family: More than three times a week    Frequency of Social Gatherings with Friends and Family:  More than three times a week    Attends Religious Services: 1 to 4 times per year    Active Member of [employer] or Organizations: Yes    Attends Archivist Meetings: 1 to 4 times per year    Marital Status: Never married     Review of Systems: A 12 point ROS discussed and pertinent positives are indicated in the HPI above.  All other systems are negative.  Review of Systems  Constitutional:  Positive for fever.  Respiratory:  Negative for shortness of breath.   Cardiovascular:  Negative for chest pain.  Psychiatric/Behavioral:  Negative for confusion.     Vital Signs: BP (!) 133/93 (BP Location: Left Arm)   Pulse (!) 105  Temp 98.1 F (36.7 C) (Oral)   Resp 17   Ht 5' 7.01" (1.702 m)   Wt 144 lb 13.5 oz (65.7 kg)   SpO2 98%   BMI 22.68 kg/m   Physical Exam Constitutional:      General: She is not in acute distress. HENT:     Mouth/Throat:     Mouth: Mucous membranes are moist.  Cardiovascular:     Rate and Rhythm: Regular rhythm. Tachycardia present.     Pulses: Normal pulses.     Heart sounds: Normal heart sounds.  Pulmonary:     Effort: Pulmonary effort is normal.     Breath sounds: Normal breath sounds.  Abdominal:     General: Bowel sounds are normal.     Palpations: Abdomen is soft.  Skin:    General: Skin is warm and dry.  Neurological:     Mental Status: She is alert and oriented to person, place, and time.  Psychiatric:        Behavior: Behavior normal.     Imaging: IR Fluoro Guide CV Line Right  Result Date: 10/14/2021 INDICATION: 26 year old female with renal failure requiring central venous access for hemodialysis. EXAM: NON-TUNNELED CENTRAL VENOUS HEMODIALYSIS CATHETER PLACEMENT WITH ULTRASOUND AND FLUOROSCOPIC GUIDANCE COMPARISON:  None Available. MEDICATIONS: None FLUOROSCOPY TIME:  One mGy COMPLICATIONS: None immediate. PROCEDURE: Informed written consent was obtained from the patient after a discussion of the risks, benefits, and  alternatives to treatment. Questions regarding the procedure were encouraged and answered. The right neck and chest were prepped with chlorhexidine in a sterile fashion, and a sterile drape was applied covering the operative field. Maximum barrier sterile technique with sterile gowns and gloves were used for the procedure. A timeout was performed prior to the initiation of the procedure. After the overlying soft tissues were anesthetized, a small venotomy incision was created and a micropuncture kit was utilized to access the internal jugular vein. Real-time ultrasound guidance was utilized for vascular access including the acquisition of a permanent ultrasound image documenting patency of the accessed vessel. The microwire was utilized to measure appropriate catheter length. A Rosen wire was advanced to the level of the IVC. Under fluoroscopic guidance, the venotomy was serially dilated, ultimately allowing placement of a 15 cm temporary Trialysis catheter with tip ultimately terminating within the superior aspect of the right atrium. Final catheter positioning was confirmed and documented with a spot radiographic image. The catheter aspirates and flushes normally. The catheter was flushed with appropriate volume heparin dwells. The catheter exit site was secured with a 0-silk retention suture. A dressing was placed. The patient tolerated the procedure well without immediate post procedural complication. IMPRESSION: Successful placement of a right internal jugular approach 15 cm temporary dialysis catheter with tip terminating with in the superior aspect of the right atrium. The catheter is ready for immediate use. PLAN: This catheter may be converted to a tunneled dialysis catheter at a later date as indicated. Ruthann Cancer, MD [facility] Electronically Signed   By: Ruthann Cancer M.D.   On: 10/14/2021 10:49   IR US Guide Vasc Access Right  Result  Date: 10/14/2021 INDICATION: 26 year old female with renal failure requiring central venous access for hemodialysis. EXAM: NON-TUNNELED CENTRAL VENOUS HEMODIALYSIS CATHETER PLACEMENT WITH ULTRASOUND AND FLUOROSCOPIC GUIDANCE COMPARISON:  None Available. MEDICATIONS: None FLUOROSCOPY TIME:  One mGy COMPLICATIONS: None immediate. PROCEDURE: Informed written consent was obtained from the patient after a discussion of the risks, benefits, and alternatives to  treatment. Questions regarding the procedure were encouraged and answered. The right neck and chest were prepped with chlorhexidine in a sterile fashion, and a sterile drape was applied covering the operative field. Maximum barrier sterile technique with sterile gowns and gloves were used for the procedure. A timeout was performed prior to the initiation of the procedure. After the overlying soft tissues were anesthetized, a small venotomy incision was created and a micropuncture kit was utilized to access the internal jugular vein. Real-time ultrasound guidance was utilized for vascular access including the acquisition of a permanent ultrasound image documenting patency of the accessed vessel. The microwire was utilized to measure appropriate catheter length. A Rosen wire was advanced to the level of the IVC. Under fluoroscopic guidance, the venotomy was serially dilated, ultimately allowing placement of a 15 cm temporary Trialysis catheter with tip ultimately terminating within the superior aspect of the right atrium. Final catheter positioning was confirmed and documented with a spot radiographic image. The catheter aspirates and flushes normally. The catheter was flushed with appropriate volume heparin dwells. The catheter exit site was secured with a 0-silk retention suture. A dressing was placed. The patient tolerated the procedure well without immediate post procedural complication. IMPRESSION: Successful placement of a right internal jugular approach 15 cm  temporary dialysis catheter with tip terminating with in the superior aspect of the right atrium. The catheter is ready for immediate use. PLAN: This catheter may be converted to a tunneled dialysis catheter at a later date as indicated. Ruthann Cancer, MD [facility] Rad[facility] Electronically Signed   By: Ruthann Cancer M.D.   On: 10/14/2021 10:49   US RENAL  Result Date: 10/07/2021 CLINICAL DATA:  AKI EXAM: RENAL / URINARY TRACT ULTRASOUND COMPLETE COMPARISON:  CT 06/07/2019 FINDINGS: Right Kidney: Renal measurements: 10.5 x 4.8 x 6.1 cm = volume: 159.90 mL. Increased renal cortical echogenicity. No hydronephrosis. Left Kidney: Renal measurements: 10.9 x 5.5 x 4.3 cm = volume: There 34.20 mL. Increased renal cortical echogenicity. No hydronephrosis. Bladder: Not visualized. Other: Right pleural effusion. IMPRESSION: Increased renal cortical echogenicity bilaterally as can be seen in medical renal disease. No hydronephrosis. Right pleural effusion. Electronically Signed   By: Maurine Simmering M.D.   On: 10/07/2021 15:44   US BREAST LTD UNI RIGHT INC AXILLA  Result Date: 10/06/2021 CLINICAL DATA:  26 year old female with swelling, erythema and scabbing of the right breast. EXAM: ULTRASOUND OF THE RIGHT BREAST COMPARISON:  Prior studies. FINDINGS: Targeted ultrasound is performed, showing diffuse thickening of the skin overlying the medial right breast with focal hypoechoic lesion entirely within the skin corresponding to an overlying scab. There is marked, diffuse to edema of the underlying soft tissue without focal fluid collection identified within the limitations of this study. IMPRESSION: Diffuse skin thickening and edema of the underlying soft tissue in the medial right breast. No definite or focal fluid collection identified within the limitations of this study. RECOMMENDATION: Clinical and symptomatic follow-up. I have discussed the findings and recommendations  with the patient. If applicable, a reminder letter will be sent to the patient regarding the next appointment. BI-RADS CATEGORY  2: Benign. Electronically Signed   By: Kristopher Oppenheim M.D.   On: 10/06/2021 10:59   US SOFT TISSUE HEAD & NECK (NON-THYROID)  Result Date: 10/02/2021 CLINICAL DATA:  Left head neck abscess with tenderness. Abscess drainage EXAM: ULTRASOUND OF HEAD/NECK SOFT TISSUES TECHNIQUE: Ultrasound examination of the head and neck soft tissues was performed in the area of  clinical concern. COMPARISON:  Subsequent noncontrast neck CT. FINDINGS: In the left neck there is muscular increased echogenicity and expansion with regional echogenic fat stranding and enlarged lymph nodes with normal morphology. No drainable collection. IMPRESSION: Cellulitis, myositis, and adenitis in the left neck without visible abscess. Electronically Signed   By: Jorje Guild M.D.   On: 10/02/2021 11:58   CT SOFT TISSUE NECK WO CONTRAST  Result Date: 10/02/2021 CLINICAL DATA:  Soft tissue swelling, infection suspected, neck xray done EXAM: CT NECK WITHOUT CONTRAST TECHNIQUE: Multidetector CT imaging of the neck was performed following the standard protocol without intravenous contrast. RADIATION DOSE REDUCTION: This exam was performed according to the departmental dose-optimization program which includes automated exposure control, adjustment of the mA and/or kV according to patient size and/or use of iterative reconstruction technique. COMPARISON:  None Available. FINDINGS: Pharynx and larynx: Normal. No mass or swelling. Salivary glands: No inflammation, mass, or stone. Thyroid: Normal. Lymph nodes: Enlarged left upper cervical chain lymph nodes. Vascular: Decreased attenuation of the blood pool. Limited intracranial: Negative. Visualized orbits: Negative. Mastoids and visualized paranasal sinuses: Mild paranasal sinus mucosal thickening in the visualized sinuses. Skeleton: No acute or aggressive process. Upper  chest: Motion limited assessment without definite consolidation the visualized lung apices. Other: In the posterior and upper left neck there is heterogeneous subcutaneous edema and edematous appearance of the paraspinal muscles, compatible with cellulitis, phlegmon, and myositis given the clinical history. This area of phlegmon measures up to approximately 4.0 x 2.4 x 4.8 cm. Evaluation for peripherally enhancing/drainable abscess is limited due to the absence of contrast. IMPRESSION: 1. In the posterior and upper left neck there is heterogeneous subcutaneous edema and edematous subjacent paraspinal muscles, compatible with cellulitis, phlegmon, and myositis given the clinical history. This area of phlegmon measures up to approximately 4.0 x 2.4 x 4.8 cm. Evaluation for peripherally enhancing/drainable abscess is limited due to the absence of contrast. If the patient is able, postcontrast imaging could further evaluate. Also, see concurrent ultrasound for additional evaluation for drainable collection. 2. Enlarged left upper cervical chain lymph nodes, nonspecific but probably reactive given the above findings. Recommend clinical follow-up. 3. Milder more diffuse edema within the subcutaneous soft tissues, possibly representing anasarca. 4. Decreased attenuation of the blood pool, which can be seen with anemia. Electronically Signed   By: Margaretha Sheffield M.D.   On: 10/02/2021 11:09   DG Chest Portable 1 View  Result Date: 10/01/2021 CLINICAL DATA:  Suspected sepsis EXAM: PORTABLE CHEST 1 VIEW COMPARISON:  10/27/2019 FINDINGS: Cardiomegaly. No confluent airspace opacities or effusions. No acute bony abnormality. IMPRESSION: Cardiomegaly.  No active disease. Electronically Signed   By: Rolm Baptise M.D.   On: 10/01/2021 22:02    Labs:  CBC: Recent Labs    10/12/21 0336 10/13/21 2233 10/15/21 0156 10/16/21 0811  WBC 13.4* 13.4* 14.7* 14.6*  HGB 8.7* 7.2* 7.2* 7.0*  HCT 25.6* 22.2* 22.4* 21.7*  PLT  136* 101* 85* 87*    COAGS: Recent Labs    02/12/21 0630 05/20/21 0657 10/01/21 2205 10/03/21 0116  INR 0.9 0.8 1.2 1.4*    BMP: Recent Labs    10/13/21 0154 10/14/21 0222 10/15/21 0156 10/16/21 0811  NA 131* 132* 134* 137  K 3.8 3.8 3.7 4.0  CL 100 97* 101 104  CO2 20* 23 24 27   GLUCOSE 121* 143* 123* 107*  BUN 70* 72* 50* 38*  CALCIUM 8.0* 8.2* 7.8* 8.0*  CREATININE 6.13* 6.21* 4.85* 3.97*  GFRNONAA 9* 9*  12* 15*    LIVER FUNCTION TESTS: Recent Labs    10/09/21 0630 10/10/21 0534 10/11/21 0105 10/12/21 0336 10/13/21 0154 10/14/21 0222 10/15/21 0156 10/16/21 0811  BILITOT 0.6 0.6 0.5 0.5  --   --   --   --   AST 11* 12* 11* 12*  --   --   --   --   ALT 6 <5 <5 <5  --   --   --   --   ALKPHOS 164* 162* 151* 155*  --   --   --   --   PROT 4.5* 4.6* 4.7* 5.3*  --   --   --   --   ALBUMIN 1.8* 2.0* 2.3* 2.7* 2.7* 2.6* 2.3* 2.3*    TUMOR MARKERS: No results for input(s): "AFPTM", "CEA", "CA199", "CHROMGRNA" in the last 8760 hours.  Assessment and Plan:  26 year old female seen today for placement of tunneled dialysis catheter due to CKD Stage IV. Previous temporary HD catheter placed 6/6 by IR. Pt has Tmax of 99.4, WBC of 14.6; secondary to these values, dialysis catheter placement will be planned for 10/19/21.  Risks and benefits discussed with the patient including, but not limited to bleeding, infection, vascular injury, pneumothorax which may require chest tube placement, air embolism or even death  All of the patient's questions were answered, patient is agreeable to proceed. Consent signed and in chart.    Thank you for this interesting consult.  I greatly enjoyed meeting Lakeithia R Roston and look forward to participating in their care.  A copy of this report was sent to the requesting provider on this date.  Electronically Signed: Lavonia Drafts, PA-C 10/16/2021, 11:07 AM   I spent a total of 20 Minutes    in face to face in clinical  consultation, greater than 50% of which was counseling/coordinating care for tunneled HD catheter placement

## 2021-10-16 NOTE — Progress Notes (Signed)
Patient ID: Lindsey Murillo, female   DOB: 02/05/1996, 26 y.o.   MRN: [id number] S:Feels better and is doing well with HD. O:BP (!) 133/93 (BP Location: Left Arm)   Pulse (!) 105   Temp 98.1 F (36.7 C) (Oral)   Resp 17   Ht 5' 7.01" (1.702 m)   Wt 65.7 kg   SpO2 98%   BMI 22.68 kg/m   Intake/Output Summary (Last 24 hours) at 10/16/2021 0935 Last data filed at 10/16/2021 0338 Gross per 24 hour  Intake 746 ml  Output --  Net 746 ml   Intake/Output: I/O last 3 completed shifts: In: 746 [P.O.:360; IV Piggyback:386] Out: -   Intake/Output this shift:  No intake/output data recorded. Weight change: 0.4 kg Gen:NAD CVS: tachy Resp:CTA Abd: +BS, soft, NT/ND Ext: 1+ pitting edema bilateral lower extremities  Recent Labs  Lab 10/10/21 0534 10/11/21 0105 10/12/21 0336 10/13/21 0154 10/14/21 0222 10/15/21 0156 10/16/21 0811  NA 127* 126* 130* 131* 132* 134* 137  K 4.2 4.1 4.2 3.8 3.8 3.7 4.0  CL 95* 95* 97* 100 97* 101 104  CO2 19* 18* 20* 20* 23 24 27   GLUCOSE 113* 131* 94 121* 143* 123* 107*  BUN 68* 68* 69* 70* 72* 50* 38*  CREATININE 5.97* 5.91* 5.96* 6.13* 6.21* 4.85* 3.97*  ALBUMIN 2.0* 2.3* 2.7* 2.7* 2.6* 2.3* 2.3*  CALCIUM 7.7* 7.5* 8.1* 8.0* 8.2* 7.8* 8.0*  PHOS 5.2* 5.2* 5.3* 5.2* 5.2* 3.7 3.3  AST 12* 11* 12*  --   --   --   --   ALT <5 <5 <5  --   --   --   --    Liver Function Tests: Recent Labs  Lab 10/10/21 0534 10/11/21 0105 10/12/21 0336 10/13/21 0154 10/14/21 0222 10/15/21 0156 10/16/21 0811  AST 12* 11* 12*  --   --   --   --   ALT <5 <5 <5  --   --   --   --   ALKPHOS 162* 151* 155*  --   --   --   --   BILITOT 0.6 0.5 0.5  --   --   --   --   PROT 4.6* 4.7* 5.3*  --   --   --   --   ALBUMIN 2.0* 2.3* 2.7*   < > 2.6* 2.3* 2.3*   < > = values in this interval not displayed.   No results for input(s): "LIPASE", "AMYLASE" in the last 168 hours. No results for input(s): "AMMONIA" in the last 168 hours. CBC: Recent Labs  Lab 10/11/21 0105  10/12/21 0336 10/13/21 2233 10/15/21 0156 10/16/21 0811  WBC 13.5* 13.4* 13.4* 14.7* 14.6*  NEUTROABS 10.8* 10.3*  --  11.9*  --   HGB 6.5* 8.7* 7.2* 7.2* 7.0*  HCT 19.6* 25.6* 22.2* 22.4* 21.7*  MCV 84.8 83.4 85.7 87.5 88.9  PLT 147* 136* 101* 85* 87*   Cardiac Enzymes: No results for input(s): "CKTOTAL", "CKMB", "CKMBINDEX", "TROPONINI" in the last 168 hours. CBG: Recent Labs  Lab 10/15/21 1703 10/15/21 2145 10/16/21 0051 10/16/21 0524 10/16/21 0846  GLUCAP 170* 192* 229* 165* 99    Iron Studies: No results for input(s): "IRON", "TIBC", "TRANSFERRIN", "FERRITIN" in the last 72 hours. Studies/Results: No results found.  acetaminophen  650 mg Oral Q6H   calcium acetate  667 mg Oral TID WC   Chlorhexidine Gluconate Cloth  6 each Topical Daily   Chlorhexidine Gluconate Cloth  6 each Topical  Q0600   collagenase   Topical Daily   darbepoetin (ARANESP) injection - NON-DIALYSIS  60 mcg Subcutaneous Q Wed-1800   doxycycline  100 mg Oral Q12H   feeding supplement (NEPRO CARB STEADY)  237 mL Oral BID BM   heparin  5,000 Units Subcutaneous Q8H   insulin aspart  0-6 Units Subcutaneous Q4H   magnesium oxide  400 mg Oral Daily   rosuvastatin  10 mg Oral Daily   sodium bicarbonate  1,300 mg Oral TID   tamsulosin  0.4 mg Oral QPC supper    BMET    Component Value Date/Time   NA 137 10/16/2021 0811   NA 132 (L) 09/24/2021 1453   K 4.0 10/16/2021 0811   CL 104 10/16/2021 0811   CO2 27 10/16/2021 0811   GLUCOSE 107 (H) 10/16/2021 0811   BUN 38 (H) 10/16/2021 0811   BUN 36 (H) 09/24/2021 1453   CREATININE 3.97 (H) 10/16/2021 0811   CREATININE 0.98 08/29/2019 1637   CALCIUM 8.0 (L) 10/16/2021 0811   GFRNONAA 15 (L) 10/16/2021 0811   GFRAA >60 10/27/2019 2021   CBC    Component Value Date/Time   WBC 14.6 (H) 10/16/2021 0811   RBC 2.44 (L) 10/16/2021 0811   HGB 7.0 (L) 10/16/2021 0811   HGB 9.5 (L) 09/24/2021 1453   HCT 21.7 (L) 10/16/2021 0811   HCT 28.1 (L) 09/24/2021  1453   PLT 87 (L) 10/16/2021 0811   PLT 248 09/24/2021 1453   MCV 88.9 10/16/2021 0811   MCV 83 09/24/2021 1453   MCH 28.7 10/16/2021 0811   MCHC 32.3 10/16/2021 0811   RDW 16.4 (H) 10/16/2021 0811   RDW 14.3 09/24/2021 1453   LYMPHSABS 1.6 10/15/2021 0156   MONOABS 0.8 10/15/2021 0156   EOSABS 0.3 10/15/2021 0156   BASOSABS 0.1 10/15/2021 0156    Assessment/Plan:   AKI/CKD stage IV - Presumably due to hemodynamic insults in setting of severe sepsis.  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  BUN/Scr continue to rise and still with significant edema.  No uremic symptoms, however I did discuss with her and her mother the need to initiate HD to help with her volume status.  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  Will need to plan for permanent vascular access after her infectious issues have been resolved.  Temp HD catheter placed by IR 10/13/21 and first HD session 10/14/21, and again on 10/15/21.  Will plan for 3rd HD session tomorrow and then continue with TTS schedule until she gets an outpatient spot.  Will need a tunneled HD catheter before she can be discharged.  WBC rising.  Blood cultures negative so will consult IR for [facility] possibly tomorrow or Monday. Avoid nephrotoxic medications including NSAIDs and iodinated intravenous contrast exposure unless the latter is absolutely indicated.   Preferred narcotic agents for pain control are hydromorphone, fentanyl, and methadone. Morphine should not be used.  Avoid Baclofen and avoid oral sodium phosphate and magnesium citrate based laxatives / bowel preps.  Continue strict Input and Output monitoring. Will monitor the patient closely with you and intervene or adjust therapy as indicated by changes in clinical status/labs  Will eventually need TDC and AVF/AVG placement once infection is under control. Will also need outpatient HD  arrangements before discharge.  Severe sepsis due to posterior neck abscess - MSSA and currently receiving cefazolin, IV Right breast cellulitis - currently on antibiotics.  Reviewed by surgery and nothing to drain. Hypervolemic hyponatremia - slowly improving with diuresis.  No symptoms. Nephrotic syndrome with wedema - on lasix 120 mg bid with IV albumin to improve response.  UF with HD as tolerated. HTN - stable Anemia of CKD stage IV.  Started ESA, transfuse for Hgb <7. Uncontrolled DM type 1 - per primary Severe protein malnutrition - per primary.   Donetta Potts, MD [name]

## 2021-10-17 DIAGNOSIS — N61 Mastitis without abscess: Secondary | ICD-10-CM | POA: Diagnosis not present

## 2021-10-17 DIAGNOSIS — N184 Chronic kidney disease, stage 4 (severe): Secondary | ICD-10-CM | POA: Diagnosis not present

## 2021-10-17 DIAGNOSIS — N179 Acute kidney failure, unspecified: Secondary | ICD-10-CM | POA: Diagnosis not present

## 2021-10-17 DIAGNOSIS — A4101 Sepsis due to Methicillin susceptible Staphylococcus aureus: Secondary | ICD-10-CM | POA: Diagnosis not present

## 2021-10-17 LAB — RENAL FUNCTION PANEL
Albumin: 2.2 g/dL — ABNORMAL LOW (ref 3.5–5.0)
Anion gap: 13 (ref 5–15)
BUN: 40 mg/dL — ABNORMAL HIGH (ref 6–20)
CO2: 26 mmol/L (ref 22–32)
Calcium: 8.5 mg/dL — ABNORMAL LOW (ref 8.9–10.3)
Chloride: 102 mmol/L (ref 98–111)
Creatinine, Ser: 4.21 mg/dL — ABNORMAL HIGH (ref 0.44–1.00)
GFR, Estimated: 14 mL/min — ABNORMAL LOW (ref >60)
Glucose, Bld: 142 mg/dL — ABNORMAL HIGH (ref 70–99)
Phosphorus: 3.5 mg/dL (ref 2.5–4.6)
Potassium: 4.1 mmol/L (ref 3.5–5.1)
Sodium: 141 mmol/L (ref 135–145)

## 2021-10-17 LAB — CBC
HCT: 21.7 % — ABNORMAL LOW (ref 36.0–46.0)
Hemoglobin: 6.9 g/dL — CL (ref 12.0–15.0)
MCH: 28.6 pg (ref 26.0–34.0)
MCHC: 31.8 g/dL (ref 30.0–36.0)
MCV: 90 fL (ref 80.0–100.0)
Platelets: 89 10*3/uL — ABNORMAL LOW (ref 150–400)
RBC: 2.41 MIL/uL — ABNORMAL LOW (ref 3.87–5.11)
RDW: 16.5 % — ABNORMAL HIGH (ref 11.5–15.5)
WBC: 16.4 10*3/uL — ABNORMAL HIGH (ref 4.0–10.5)
nRBC: 0 % (ref 0.0–0.2)

## 2021-10-17 LAB — GLUCOSE, CAPILLARY
Glucose-Capillary: 105 mg/dL — ABNORMAL HIGH (ref 70–99)
Glucose-Capillary: 163 mg/dL — ABNORMAL HIGH (ref 70–99)
Glucose-Capillary: 168 mg/dL — ABNORMAL HIGH (ref 70–99)
Glucose-Capillary: 195 mg/dL — ABNORMAL HIGH (ref 70–99)
Glucose-Capillary: 214 mg/dL — ABNORMAL HIGH (ref 70–99)
Glucose-Capillary: 94 mg/dL (ref 70–99)

## 2021-10-17 LAB — PREPARE RBC (CROSSMATCH)

## 2021-10-17 MED ORDER — DIPHENHYDRAMINE HCL 25 MG PO CAPS: 25.0000 mg | ORAL_CAPSULE | Freq: Once | ORAL | Status: DC

## 2021-10-17 MED ORDER — SODIUM CHLORIDE 0.9% IV SOLUTION: Freq: Once | INTRAVENOUS | Status: AC

## 2021-10-17 MED ORDER — SODIUM CHLORIDE 0.9% IV SOLUTION: Freq: Once | INTRAVENOUS | Status: DC

## 2021-10-17 MED ORDER — HEPARIN SODIUM (PORCINE) 1000 UNIT/ML IJ SOLN
INTRAMUSCULAR | Status: AC
  Administered 2021-10-17: 2.4 [IU]
  Filled 2021-10-17: qty 4

## 2021-10-17 MED ORDER — ACETAMINOPHEN 325 MG PO TABS: 650.0000 mg | ORAL_TABLET | Freq: Once | ORAL | Status: DC

## 2021-10-17 NOTE — Progress Notes (Signed)
Hgb 6.9, will order 1 unit. Phillips Climes MD

## 2021-10-17 NOTE — Progress Notes (Signed)
Patient ID: Lindsey Murillo, female   DOB: [birth year], 26 y.o.   MRN: [id number] S:Feeling well, no new complaints O:BP (!) 152/102   Pulse (!) 109   Temp 97.8 F (36.6 C)   Resp 12   Ht 5' 7.01" (1.702 m)   Wt 69.7 kg   SpO2 96%   BMI 24.06 kg/m   Intake/Output Summary (Last 24 hours) at 10/17/2021 0915 Last data filed at 10/17/2021 0352 Gross per 24 hour  Intake 1059.54 ml  Output --  Net 1059.54 ml   Intake/Output: I/O last 3 completed shifts: In: 1445.5 [P.O.:820; IV Piggyback:625.5] Out: -   Intake/Output this shift:  No intake/output data recorded. Weight change: -0.7 kg Gen:NAD CVS: Tachy Resp: CTA Abd: +BS, soft, NT/ND Ext: 1+ edema bilateral lower ext  Recent Labs  Lab 10/11/21 0105 10/12/21 0336 10/13/21 0154 10/14/21 0222 10/15/21 0156 10/16/21 0811 10/17/21 0149  NA 126* 130* 131* 132* 134* 137 141  K 4.1 4.2 3.8 3.8 3.7 4.0 4.1  CL 95* 97* 100 97* 101 104 102  CO2 18* 20* 20* 23 24 27 26   GLUCOSE 131* 94 121* 143* 123* 107* 142*  BUN 68* 69* 70* 72* 50* 38* 40*  CREATININE 5.91* 5.96* 6.13* 6.21* 4.85* 3.97* 4.21*  ALBUMIN 2.3* 2.7* 2.7* 2.6* 2.3* 2.3* 2.2*  CALCIUM 7.5* 8.1* 8.0* 8.2* 7.8* 8.0* 8.5*  PHOS 5.2* 5.3* 5.2* 5.2* 3.7 3.3 3.5  AST 11* 12*  --   --   --   --   --   ALT <5 <5  --   --   --   --   --    Liver Function Tests: Recent Labs  Lab 10/11/21 0105 10/12/21 0336 10/13/21 0154 10/15/21 0156 10/16/21 0811 10/17/21 0149  AST 11* 12*  --   --   --   --   ALT <5 <5  --   --   --   --   ALKPHOS 151* 155*  --   --   --   --   BILITOT 0.5 0.5  --   --   --   --   PROT 4.7* 5.3*  --   --   --   --   ALBUMIN 2.3* 2.7*   < > 2.3* 2.3* 2.2*   < > = values in this interval not displayed.   No results for input(s): "LIPASE", "AMYLASE" in the last 168 hours. No results for input(s): "AMMONIA" in the last 168 hours. CBC: Recent Labs  Lab 10/11/21 0105 10/12/21 0336 10/13/21 2233 10/15/21 0156 10/16/21 0811 10/17/21 0149   WBC 13.5* 13.4* 13.4* 14.7* 14.6* 16.4*  NEUTROABS 10.8* 10.3*  --  11.9*  --   --   HGB 6.5* 8.7* 7.2* 7.2* 7.0* 6.9*  HCT 19.6* 25.6* 22.2* 22.4* 21.7* 21.7*  MCV 84.8 83.4 85.7 87.5 88.9 90.0  PLT 147* 136* 101* 85* 87* 89*   Cardiac Enzymes: No results for input(s): "CKTOTAL", "CKMB", "CKMBINDEX", "TROPONINI" in the last 168 hours. CBG: Recent Labs  Lab 10/16/21 1747 10/16/21 1949 10/17/21 0019 10/17/21 0359 10/17/21 0818  GLUCAP 224* 192* 214* 163* 105*    Iron Studies: No results for input(s): "IRON", "TIBC", "TRANSFERRIN", "FERRITIN" in the last 72 hours. Studies/Results: No results found.  sodium chloride   Intravenous Once   acetaminophen  650 mg Oral Q6H   acetaminophen  650 mg Oral Once   calcium acetate  667 mg Oral TID WC   Chlorhexidine Gluconate Cloth  6 each Topical Daily   Chlorhexidine Gluconate Cloth  6 each Topical Q0600   collagenase   Topical Daily   darbepoetin (ARANESP) injection - NON-DIALYSIS  60 mcg Subcutaneous Q Wed-1800   diphenhydrAMINE  25 mg Oral Once   doxycycline  100 mg Oral Q12H   feeding supplement (NEPRO CARB STEADY)  237 mL Oral BID BM   heparin  5,000 Units Subcutaneous Q8H   insulin aspart  0-6 Units Subcutaneous TID WC   magnesium oxide  400 mg Oral Daily   rosuvastatin  10 mg Oral Daily   sodium bicarbonate  1,300 mg Oral TID   tamsulosin  0.4 mg Oral QPC supper    BMET    Component Value Date/Time   NA 141 10/17/2021 0149   NA 132 (L) 09/24/2021 1453   K 4.1 10/17/2021 0149   CL 102 10/17/2021 0149   CO2 26 10/17/2021 0149   GLUCOSE 142 (H) 10/17/2021 0149   BUN 40 (H) 10/17/2021 0149   BUN 36 (H) 09/24/2021 1453   CREATININE 4.21 (H) 10/17/2021 0149   CREATININE 0.98 08/29/2019 1637   CALCIUM 8.5 (L) 10/17/2021 0149   GFRNONAA 14 (L) 10/17/2021 0149   GFRAA >60 10/27/2019 2021   CBC    Component Value Date/Time   WBC 16.4 (H) 10/17/2021 0149   RBC 2.41 (L) 10/17/2021 0149   HGB 6.9 (LL) 10/17/2021 0149    HGB 9.5 (L) 09/24/2021 1453   HCT 21.7 (L) 10/17/2021 0149   HCT 28.1 (L) 09/24/2021 1453   PLT 89 (L) 10/17/2021 0149   PLT 248 09/24/2021 1453   MCV 90.0 10/17/2021 0149   MCV 83 09/24/2021 1453   MCH 28.6 10/17/2021 0149   MCHC 31.8 10/17/2021 0149   RDW 16.5 (H) 10/17/2021 0149   RDW 14.3 09/24/2021 1453   LYMPHSABS 1.6 10/15/2021 0156   MONOABS 0.8 10/15/2021 0156   EOSABS 0.3 10/15/2021 0156   BASOSABS 0.1 10/15/2021 0156    Assessment/Plan:   AKI/CKD stage IV - Presumably due to hemodynamic insults in setting of severe sepsis.  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  BUN/Scr continue to rise and still with significant edema.  No uremic symptoms, however I did discuss with her and her mother the need to initiate HD to help with her volume status.  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  Will need to plan for permanent vascular access after her infectious issues have been resolved.  Temp HD catheter placed by IR 10/13/21 and first HD session 10/14/21, and again on 10/15/21.  Will plan for 3rd HD session tomorrow and then continue with TTS schedule until she gets an outpatient spot.  Will need a tunneled HD catheter before she can be discharged.  WBC rising.  Blood cultures negative so plan is for [facility] on 10/19/21 by IR. Avoid nephrotoxic medications including NSAIDs and iodinated intravenous contrast exposure unless the latter is absolutely indicated.   Preferred narcotic agents for pain control are hydromorphone, fentanyl, and methadone. Morphine should not be used.  Avoid Baclofen and avoid oral sodium phosphate and magnesium citrate based laxatives / bowel preps.  Continue strict Input and Output monitoring. Will monitor the patient closely with you and intervene or adjust therapy as indicated by changes in clinical status/labs  Will eventually need TDC and AVF/AVG  placement once infection is under control. Will also need outpatient HD arrangements before discharge.  Severe  sepsis due to posterior neck abscess - MSSA and currently receiving cefazolin, IV Right breast cellulitis - currently on antibiotics.  Reviewed by surgery and nothing to drain. Hypervolemic hyponatremia - slowly improving with diuresis.  No symptoms. Nephrotic syndrome with wedema - on lasix 120 mg bid with IV albumin to improve response.  UF with HD as tolerated. HTN - stable Anemia of CKD stage IV.  Started ESA, transfuse for Hgb <7. Uncontrolled DM type 1 - per primary Severe protein malnutrition - per primary.   Donetta Potts, MD [facility]

## 2021-10-17 NOTE — Progress Notes (Signed)
PROGRESS NOTE    [name]  UXN:[id number] DOB: 06/25/1995 DOA: 10/01/2021 PCP: Coral Spikes, DO    Chief Complaint  Patient presents with   Leg Swelling    X 2 weeks     Brief Narrative:  Lindsey Murillo is a 26 y.o. female with past medical history significant for is a 26 year old female with past medical history significant for type 1 diabetes mellitus, stage IV CKD with nephrotic syndrome, anemia of chronic medical/renal disease, diabetic neuropathy, HTN who initially presented to [facility] ED on 5/25 with complaints of not feeling well, lower extremity edema, and posterior neck pain with associated drainage.  Mother at bedside assists with HPI given patient's resistance/evasive to questions.  Her mother reports that patient has not been feeling well for the last 2 weeks and noted a area of the posterior left neck that started draining 2 days prior.  Patient/mother denied any trauma to that region.  Pain is worse with any type of range of motion of the neck, nothing seems to make it better.  Mother put some ointment on it which offered her no relief.   In the ED, temperature 98.2 F, HR 102, RR 17, BP 96/67, SPO2 100% on room air.  Sodium 130, potassium 2.7, chloride 107, CO2 14, glucose 179, BUN 66, creatinine 4.76.  BNP 626.0.  WBC 38.9, hemoglobin 8.6, platelet count 330.  Lactic acid 0.7, procalcitonin 1.85.  INR 1.2.  Urinalysis with trace leukocytes, negative nitrite, rare bacteria, 11-20 WBCs.  Pregnancy test negative.  Chest x-ray with cardiomegaly, no active cardiopulmonary disease process.  CT soft tissue neck with area of phlegmon/abscess measuring 4 x 2.4 x 4.8 cm localized upper left neck with enlarged left upper cervical chain lymph nodes.  Blood cultures x2 were drawn.  Patient was started on vancomycin, cefepime, Flagyl.  Initially admitted to [facility].  Valuated by general surgery, Dr. Arnoldo Morale and requested transfer to a higher level of care due to  multiple comorbidities.  Case was discussed with ENT, Dr. Benjamine Mola; and location was out of scope of ENT and recommended transfer to [facility] for general surgery evaluation.  Patient was transferred to [facility] and admitted to the hospital service with general surgery in consultation.   She's now s/p I&D of L side of neck.  Surgery signed off, plan for 10-14 days abx.  R breast without evidence of abscess, plan for outpatient follow up, 10 days doxycycline.  Renal following for aki on CKD, tunneled HD cath placed today.      Assessment & Plan:  Principal Problem:   Sepsis ([place]) Active Problems:   Cellulitis of right breast   Uncontrolled type 1 diabetes mellitus with hyperglycemia ([name])   Pyuria   AKI (acute kidney injury) ([place])   Nephrotic syndrome   Hyponatremia   Peripheral edema   Hypokalemia   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 chronic kidney disease ([name])   Protein-calorie malnutrition, severe ([name])   Physical deconditioning   Hypomagnesemia   Broken tooth   Neck abscess   CKD (chronic kidney disease) stage 4, GFR 15-29 ml/min ([name])    Assessment and Plan: * Sepsis ([place]) Ruled in for sepsis at admission Due to L neck abscess CT with 4x2.4x4.8 cm phlegmon (limited evaluation due to absence of contrast), enlarged L upper chain cervical chain LN S/p I&D 5/28, per surgery -> wet to dry dressing changes BID, santyl to wound base Concern for R breast infection as well, no definite or  focal fluid collection noted on [place] MSSA on 5/28 culture Blood cultures ngtd Vancomycin 5/25 Cefepime 5/25-5/28 linezolid 5/27 - 5/30 Ancef 5/30 - present Narrowed to ancef, continue for now (will plan for at least 14 days, then reeval) Surgery recommending 10 days abx (doxycycline) for R breast Leukocytosis fluctuating.    Cellulitis of right breast US without definite or focal fluid collection Needs outpatient follow up, this has been arranged by surgery -> follow up with surgery and at breast  clinic Surgery recommended 10 days doxy  Pyuria UA with bacteria and yeast - no clear symptoms, suspect contaminant Follow culture only if symptomatic  Uncontrolled type 1 diabetes mellitus with hyperglycemia ([place]) Uncontrolled type I diabetic with a last hemoglobin A1c of 9.8 (09/24/2021). Basal insulin appears it was d/c'd due to hypoglycemia?  Hold cautiously for now (she was just on D5 IVF and still wasn't requiring much insulin) -CBG noted at 163 this morning.  -SSI.   Hyponatremia Overloaded - improved with diuresis  Follow with diuresis  Nephrotic syndrome - On albumin and IV Lasix as well as started on hemodialysis.  Per nephrology.  AKI (acute kidney injury) ([place]) Creatinine 4.21 today - relatively stable with diuresis  Baseline ~3 Renal US negative for hydronephrosis.  Appearred overloaded early on in the hospitalization, LE edema appears to be improving gradually with diuresis and HD. Renal consulted, appreciate assistance - temp HD cath placed  Diuresis with albumin given edema/nephrotic syndrome -Patient also underwent hemodialysis on 10/14/2021, 10/15/2021 per nephrology. 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 -Per nephrology will eventually need TEE DC and AVF/AVG placement once infection under control. -Patient assessed by interventional radiology for placement of dialysis catheter to be planned for Monday, 10/19/2021. -We will need outpatient HD arrangements prior to discharge. -Per nephrology.  Peripheral edema Diuresis with albumin for nephrotic syndrome Per nephrology.  Hypokalemia Repleted.   -On hemodialysis.   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 chronic kidney disease ([name]) Follow Elevated ferritin, low iron - normal folate, elevated b12 S/p 1 unit pRBC -Hemoglobin at 6.9 this morning. -Patient with no overt bleeding. -Transfused 2 units packed red blood cells during hemodialysis today.. -Transfusion threshold hemoglobin <  7.  Protein-calorie malnutrition, severe ([place]) - Continue nutritional supplementation.  Physical deconditioning PT  Hypomagnesemia Repleted.  Magnesium noted at 1.9   Broken tooth Upper right Pain related to this Continue abx for now  Needs dental f/u outpatient         DVT prophylaxis: Heparin Code Status: Full Family Communication: Updated patient, no family at bedside. Disposition: TBD  Status is: Inpatient Remains inpatient appropriate because: Severity of illness   Consultants:  General surgery: Dr. Donne Hazel 10/03/2021 Nephrology: Dr. Candiss Norse 10/07/2021 Wound care RN  Procedures:  CT soft tissue neck 10/02/2021 Chest x-ray 10/01/2021 Renal ultrasound 10/07/2021 Ultrasound of left breast 10/04/2021 Ultrasound of the head and neck 10/02/2021 Irrigation and debridement neck abscess by Dr. Rosendo Gros 10/04/2021/general surgery. Transfusion 2 units packed red blood cells 10/17/2021 during HD. Transfusion 1 unit packed red blood cells 10/11/2021.    Antimicrobials:  Anti-infectives (From admission, onward)    Start     Dose/Rate Route Frequency Ordered Stop   10/19/21 0600  ceFAZolin (ANCEF) IVPB 2g/100 mL premix        2 g 200 mL/hr over 30 Minutes Intravenous To Radiology 10/16/21 1125 10/20/21 0600   10/13/21 2200  doxycycline (VIBRA-TABS) tablet 100 mg        100 mg Oral Every 12 hours  10/13/21 1719 10/23/21 2159   10/06/21 2200  ceFAZolin (ANCEF) IVPB 1 g/50 mL premix        1 g 100 mL/hr over 30 Minutes Intravenous Every 12 hours 10/06/21 1052     10/04/21 2200  ceFEPIme (MAXIPIME) 1 g in sodium chloride 0.9 % 100 mL IVPB  Status:  Discontinued        1 g 200 mL/hr over 30 Minutes Intravenous Every 24 hours 10/04/21 1011 10/05/21 1343   10/03/21 2200  vancomycin (VANCOREADY) IVPB 750 mg/150 mL  Status:  Discontinued        750 mg 150 mL/hr over 60 Minutes Intravenous Every 48 hours 10/02/21 0033 10/02/21 1144   10/03/21 1000  linezolid (ZYVOX) IVPB 600 mg   Status:  Discontinued        600 mg 300 mL/hr over 60 Minutes Intravenous Every 12 hours 10/02/21 1144 10/06/21 1052   10/02/21 2200  ceFEPIme (MAXIPIME) 2 g in sodium chloride 0.9 % 100 mL IVPB  Status:  Discontinued        2 g 200 mL/hr over 30 Minutes Intravenous Every 24 hours 10/02/21 0033 10/04/21 1011   10/02/21 0000  ceFEPIme (MAXIPIME) 2 g in sodium chloride 0.9 % 100 mL IVPB        2 g 200 mL/hr over 30 Minutes Intravenous  Once 10/01/21 2358 10/02/21 0246   10/02/21 0000  metroNIDAZOLE (FLAGYL) IVPB 500 mg        500 mg 100 mL/hr over 60 Minutes Intravenous  Once 10/01/21 2358 10/02/21 0246   10/02/21 0000  vancomycin (VANCOCIN) IVPB 1000 mg/200 mL premix        1,000 mg 200 mL/hr over 60 Minutes Intravenous  Once 10/01/21 2358 10/02/21 0244         Subjective: Just returned from hemodialysis.  No chest pain.  Denies any significant shortness of breath.  No abdominal pain.  Tolerating current diet.  Mother and aunt at bedside.     Objective: Vitals:   10/17/21 1230 10/17/21 1300 10/17/21 1326 10/17/21 1500  BP: (!) 138/97 (!) 147/96 (!) 180/125 139/86  Pulse: 88 (!) 103 (!) 108 97  Resp: 17 20 16 16   Temp:   (!) 97.3 F (36.3 C) 98.3 F (36.8 C)  TempSrc:   Oral Oral  SpO2:   99% 97%  Weight:      Height:        Intake/Output Summary (Last 24 hours) at 10/17/2021 1832 Last data filed at 10/17/2021 1300 Gross per 24 hour  Intake 1087.04 ml  Output 2.68 ml  Net 1084.36 ml    Filed Weights   10/16/21 0500 10/17/21 0414 10/17/21 0900  Weight: 65.7 kg 66 kg 69.7 kg    Examination:  General exam: NAD.  Left neck wound stable. Respiratory system: Bibasilar crackles noted.  No wheezing.  No rhonchi.  Fair air movement.  Speaking in full sentences  Cardiovascular system: Regular rate and rhythm no murmurs rubs or gallops.  No JVD.  1+ bilateral lower extremity edema. Gastrointestinal system: Abdomen is nondistended, soft and nontender. No organomegaly or masses  felt. Normal bowel sounds heard. Central nervous system: Alert and oriented. No focal neurological deficits. Extremities: 1+ bilateral lower extremity edema.  Symmetric 5 x 5 power. Skin: No rashes, lesions or ulcers Psychiatry: Judgement and insight appear normal. Mood & affect appropriate.     Data Reviewed:   CBC: Recent Labs  Lab 10/11/21 0105 10/12/21 0336 10/13/21 2233 10/15/21 0156 10/16/21  6644 10/17/21 0149  WBC 13.5* 13.4* 13.4* 14.7* 14.6* 16.4*  NEUTROABS 10.8* 10.3*  --  11.9*  --   --   HGB 6.5* 8.7* 7.2* 7.2* 7.0* 6.9*  HCT 19.6* 25.6* 22.2* 22.4* 21.7* 21.7*  MCV 84.8 83.4 85.7 87.5 88.9 90.0  PLT 147* 136* 101* 85* 87* 89*    Basic Metabolic Panel: Recent Labs  Lab 10/11/21 0105 10/12/21 0336 10/13/21 0154 10/14/21 0222 10/15/21 0156 10/16/21 0811 10/17/21 0149  NA 126* 130* 131* 132* 134* 137 141  K 4.1 4.2 3.8 3.8 3.7 4.0 4.1  CL 95* 97* 100 97* 101 104 102  CO2 18* 20* 20* 23 24 27 26   GLUCOSE 131* 94 121* 143* 123* 107* 142*  BUN 68* 69* 70* 72* 50* 38* 40*  CREATININE 5.91* 5.96* 6.13* 6.21* 4.85* 3.97* 4.21*  CALCIUM 7.5* 8.1* 8.0* 8.2* 7.8* 8.0* 8.5*  MG 1.8 1.9  --   --  1.9  --   --   PHOS 5.2* 5.3* 5.2* 5.2* 3.7 3.3 3.5    GFR: Estimated Creatinine Clearance: 19.5 mL/min (A) (by C-G formula based on SCr of 4.21 mg/dL (H)).  Liver Function Tests: Recent Labs  Lab 10/11/21 0105 10/12/21 0336 10/13/21 0154 10/14/21 0222 10/15/21 0156 10/16/21 0811 10/17/21 0149  AST 11* 12*  --   --   --   --   --   ALT <5 <5  --   --   --   --   --   ALKPHOS 151* 155*  --   --   --   --   --   BILITOT 0.5 0.5  --   --   --   --   --   PROT 4.7* 5.3*  --   --   --   --   --   ALBUMIN 2.3* 2.7* 2.7* 2.6* 2.3* 2.3* 2.2*    CBG: Recent Labs  Lab 10/17/21 0019 10/17/21 0359 10/17/21 0818 10/17/21 1331 10/17/21 1748  GLUCAP 214* 163* 105* 94 168*     No results found for this or any previous visit (from the past 240 hour(s)).        Radiology Studies: No results found.      Scheduled Meds:  sodium chloride   Intravenous Once   acetaminophen  650 mg Oral Q6H   acetaminophen  650 mg Oral Once   calcium acetate  667 mg Oral TID WC   Chlorhexidine Gluconate Cloth  6 each Topical Daily   Chlorhexidine Gluconate Cloth  6 each Topical Q0600   collagenase   Topical Daily   darbepoetin (ARANESP) injection - NON-DIALYSIS  60 mcg Subcutaneous Q Wed-1800   diphenhydrAMINE  25 mg Oral Once   doxycycline  100 mg Oral Q12H   feeding supplement (NEPRO CARB STEADY)  237 mL Oral BID BM   heparin  5,000 Units Subcutaneous Q8H   insulin aspart  0-6 Units Subcutaneous TID WC   magnesium oxide  400 mg Oral Daily   rosuvastatin  10 mg Oral Daily   sodium bicarbonate  1,300 mg Oral TID   tamsulosin  0.4 mg Oral QPC supper   Continuous Infusions:   ceFAZolin (ANCEF) IV 1 g (10/17/21 1403)   [START ON 10/19/2021]  ceFAZolin (ANCEF) IV     furosemide 120 mg (10/17/21 1805)     LOS: 15 days    Time spent: 35 minutes    Irine Seal, MD [facility]   To contact the  attending provider between 7A-7P or the covering provider during after hours 7P-7A, please log into the web site [email or web address] and access using universal [place] password for that web site. If you do not have the password, please call the hospital operator.  10/17/2021, 6:32 PM

## 2021-10-18 ENCOUNTER — Inpatient Hospital Stay (HOSPITAL_COMMUNITY): Payer: 59

## 2021-10-18 DIAGNOSIS — N61 Mastitis without abscess: Secondary | ICD-10-CM | POA: Diagnosis not present

## 2021-10-18 DIAGNOSIS — N179 Acute kidney failure, unspecified: Secondary | ICD-10-CM | POA: Diagnosis not present

## 2021-10-18 DIAGNOSIS — N184 Chronic kidney disease, stage 4 (severe): Secondary | ICD-10-CM | POA: Diagnosis not present

## 2021-10-18 DIAGNOSIS — A4101 Sepsis due to Methicillin susceptible Staphylococcus aureus: Secondary | ICD-10-CM | POA: Diagnosis not present

## 2021-10-18 LAB — CBC WITH DIFFERENTIAL/PLATELET
Abs Immature Granulocytes: 0.08 10*3/uL — ABNORMAL HIGH (ref 0.00–0.07)
Basophils Absolute: 0.2 10*3/uL — ABNORMAL HIGH (ref 0.0–0.1)
Basophils Relative: 1 %
Eosinophils Absolute: 0.6 10*3/uL — ABNORMAL HIGH (ref 0.0–0.5)
Eosinophils Relative: 3 %
HCT: 29.8 % — ABNORMAL LOW (ref 36.0–46.0)
Hemoglobin: 9.4 g/dL — ABNORMAL LOW (ref 12.0–15.0)
Immature Granulocytes: 0 %
Lymphocytes Relative: 13 %
Lymphs Abs: 2.5 10*3/uL (ref 0.7–4.0)
MCH: 26.9 pg (ref 26.0–34.0)
MCHC: 31.5 g/dL (ref 30.0–36.0)
MCV: 85.1 fL (ref 80.0–100.0)
Monocytes Absolute: 1.1 10*3/uL — ABNORMAL HIGH (ref 0.1–1.0)
Monocytes Relative: 6 %
Neutro Abs: 14.2 10*3/uL — ABNORMAL HIGH (ref 1.7–7.7)
Neutrophils Relative %: 77 %
Platelets: 98 10*3/uL — ABNORMAL LOW (ref 150–400)
RBC: 3.5 MIL/uL — ABNORMAL LOW (ref 3.87–5.11)
RDW: 20.1 % — ABNORMAL HIGH (ref 11.5–15.5)
WBC: 18.7 10*3/uL — ABNORMAL HIGH (ref 4.0–10.5)
nRBC: 0 % (ref 0.0–0.2)

## 2021-10-18 LAB — RENAL FUNCTION PANEL
Albumin: 2.1 g/dL — ABNORMAL LOW (ref 3.5–5.0)
Albumin: 2.3 g/dL — ABNORMAL LOW (ref 3.5–5.0)
Anion gap: 9 (ref 5–15)
Anion gap: 9 (ref 5–15)
BUN: 23 mg/dL — ABNORMAL HIGH (ref 6–20)
BUN: 23 mg/dL — ABNORMAL HIGH (ref 6–20)
CO2: 28 mmol/L (ref 22–32)
CO2: 26 mmol/L (ref 22–32)
Calcium: 8.2 mg/dL — ABNORMAL LOW (ref 8.9–10.3)
Calcium: 8.6 mg/dL — ABNORMAL LOW (ref 8.9–10.3)
Chloride: 102 mmol/L (ref 98–111)
Chloride: 103 mmol/L (ref 98–111)
Creatinine, Ser: 3.12 mg/dL — ABNORMAL HIGH (ref 0.44–1.00)
Creatinine, Ser: 3.14 mg/dL — ABNORMAL HIGH (ref 0.44–1.00)
GFR, Estimated: 20 mL/min — ABNORMAL LOW (ref >60)
GFR, Estimated: 20 mL/min — ABNORMAL LOW (ref >60)
Glucose, Bld: 191 mg/dL — ABNORMAL HIGH (ref 70–99)
Glucose, Bld: 106 mg/dL — ABNORMAL HIGH (ref 70–99)
Phosphorus: 2.6 mg/dL (ref 2.5–4.6)
Phosphorus: 2.9 mg/dL (ref 2.5–4.6)
Potassium: 4 mmol/L (ref 3.5–5.1)
Potassium: 4.2 mmol/L (ref 3.5–5.1)
Sodium: 139 mmol/L (ref 135–145)
Sodium: 138 mmol/L (ref 135–145)

## 2021-10-18 LAB — CBC
HCT: 31.5 % — ABNORMAL LOW (ref 36.0–46.0)
Hemoglobin: 10 g/dL — ABNORMAL LOW (ref 12.0–15.0)
MCH: 27.1 pg (ref 26.0–34.0)
MCHC: 31.7 g/dL (ref 30.0–36.0)
MCV: 85.4 fL (ref 80.0–100.0)
Platelets: 101 10*3/uL — ABNORMAL LOW (ref 150–400)
RBC: 3.69 MIL/uL — ABNORMAL LOW (ref 3.87–5.11)
RDW: 19.9 % — ABNORMAL HIGH (ref 11.5–15.5)
WBC: 15.5 10*3/uL — ABNORMAL HIGH (ref 4.0–10.5)
nRBC: 0 % (ref 0.0–0.2)

## 2021-10-18 LAB — GLUCOSE, CAPILLARY
Glucose-Capillary: 118 mg/dL — ABNORMAL HIGH (ref 70–99)
Glucose-Capillary: 106 mg/dL — ABNORMAL HIGH (ref 70–99)
Glucose-Capillary: 88 mg/dL (ref 70–99)
Glucose-Capillary: 82 mg/dL (ref 70–99)

## 2021-10-18 MED ORDER — CEFAZOLIN SODIUM-DEXTROSE 1-4 GM/50ML-% IV SOLN
1.0000 g | INTRAVENOUS | Status: AC
  Administered 2021-10-19 – 2021-10-21 (×3): 1 g via INTRAVENOUS
  Filled 2021-10-18 (×4): qty 50

## 2021-10-18 MED ORDER — SODIUM CHLORIDE 0.9% FLUSH
10.0000 mL | INTRAVENOUS | Status: DC | PRN
  Administered 2021-10-19 (×2): 10 mL

## 2021-10-18 MED ORDER — HEPARIN SODIUM (PORCINE) 1000 UNIT/ML DIALYSIS
20.0000 [IU]/kg | INTRAMUSCULAR | Status: DC | PRN
Start: 2021-10-18 — End: 2021-10-20
  Filled 2021-10-18 (×2): qty 2

## 2021-10-18 NOTE — Progress Notes (Signed)
Patient ID: DILIA ALEMANY, female   DOB: [birth year], 26 y.o.   MRN: [id number] S: Feels well, no new complaints. O:BP (!) 147/98 (BP Location: Left Arm)   Pulse 94   Temp 97.8 F (36.6 C) (Oral)   Resp 16   Ht 5' 7.01" (1.702 m)   Wt 69.7 kg   SpO2 97%   BMI 24.06 kg/m   Intake/Output Summary (Last 24 hours) at 10/18/2021 1151 Last data filed at 10/17/2021 1300 Gross per 24 hour  Intake --  Output 2.68 ml  Net -2.68 ml   Intake/Output: I/O last 3 completed shifts: In: 905 [Blood:839.5; IV Piggyback:65.5] Out: 2.7 [Other:2.7]  Intake/Output this shift:  No intake/output data recorded. Weight change: 3.7 kg Gen: NAD CVS: RRR Resp:CTA Abd: +BS, soft, NT/ND Ext: 1+ edema BLE  Recent Labs  Lab 10/12/21 0336 10/13/21 0154 10/14/21 0222 10/15/21 0156 10/16/21 0811 10/17/21 0149 10/18/21 0100 10/18/21 0937  NA 130* 131* 132* 134* 137 141 139 138  K 4.2 3.8 3.8 3.7 4.0 4.1 4.0 4.2  CL 97* 100 97* 101 104 102 102 103  CO2 20* 20* 23 24 27 26 28 26   GLUCOSE 94 121* 143* 123* 107* 142* 191* 106*  BUN 69* 70* 72* 50* 38* 40* 23* 23*  CREATININE 5.96* 6.13* 6.21* 4.85* 3.97* 4.21* 3.12* 3.14*  ALBUMIN 2.7* 2.7* 2.6* 2.3* 2.3* 2.2* 2.1* 2.3*  CALCIUM 8.1* 8.0* 8.2* 7.8* 8.0* 8.5* 8.2* 8.6*  PHOS 5.3* 5.2* 5.2* 3.7 3.3 3.5 2.6 2.9  AST 12*  --   --   --   --   --   --   --   ALT <5  --   --   --   --   --   --   --    Liver Function Tests: Recent Labs  Lab 10/12/21 0336 10/13/21 0154 10/17/21 0149 10/18/21 0100 10/18/21 0937  AST 12*  --   --   --   --   ALT <5  --   --   --   --   ALKPHOS 155*  --   --   --   --   BILITOT 0.5  --   --   --   --   PROT 5.3*  --   --   --   --   ALBUMIN 2.7*   < > 2.2* 2.1* 2.3*   < > = values in this interval not displayed.   No results for input(s): "LIPASE", "AMYLASE" in the last 168 hours. No results for input(s): "AMMONIA" in the last 168 hours. CBC: Recent Labs  Lab 10/12/21 0336 10/13/21 2233 10/15/21 0156  10/16/21 0811 10/17/21 0149 10/18/21 0100 10/18/21 0937  WBC 13.4*   < > 14.7* 14.6* 16.4* 18.7* 15.5*  NEUTROABS 10.3*  --  11.9*  --   --  14.2*  --   HGB 8.7*   < > 7.2* 7.0* 6.9* 9.4* 10.0*  HCT 25.6*   < > 22.4* 21.7* 21.7* 29.8* 31.5*  MCV 83.4   < > 87.5 88.9 90.0 85.1 85.4  PLT 136*   < > 85* 87* 89* 98* 101*   < > = values in this interval not displayed.   Cardiac Enzymes: No results for input(s): "CKTOTAL", "CKMB", "CKMBINDEX", "TROPONINI" in the last 168 hours. CBG: Recent Labs  Lab 10/17/21 0818 10/17/21 1331 10/17/21 1748 10/17/21 2106 10/18/21 0732  GLUCAP 105* 94 168* 195* 118*    Iron Studies: No results  for input(s): "IRON", "TIBC", "TRANSFERRIN", "FERRITIN" in the last 72 hours. Studies/Results: No results found.  sodium chloride   Intravenous Once   acetaminophen  650 mg Oral Q6H   acetaminophen  650 mg Oral Once   calcium acetate  667 mg Oral TID WC   Chlorhexidine Gluconate Cloth  6 each Topical Daily   Chlorhexidine Gluconate Cloth  6 each Topical Q0600   collagenase   Topical Daily   darbepoetin (ARANESP) injection - NON-DIALYSIS  60 mcg Subcutaneous Q Wed-1800   diphenhydrAMINE  25 mg Oral Once   doxycycline  100 mg Oral Q12H   feeding supplement (NEPRO CARB STEADY)  237 mL Oral BID BM   heparin  5,000 Units Subcutaneous Q8H   insulin aspart  0-6 Units Subcutaneous TID WC   magnesium oxide  400 mg Oral Daily   rosuvastatin  10 mg Oral Daily   sodium bicarbonate  1,300 mg Oral TID   tamsulosin  0.4 mg Oral QPC supper    BMET    Component Value Date/Time   NA 138 10/18/2021 0937   NA 132 (L) 09/24/2021 1453   K 4.2 10/18/2021 0937   CL 103 10/18/2021 0937   CO2 26 10/18/2021 0937   GLUCOSE 106 (H) 10/18/2021 0937   BUN 23 (H) 10/18/2021 0937   BUN 36 (H) 09/24/2021 1453   CREATININE 3.14 (H) 10/18/2021 0937   CREATININE 0.98 08/29/2019 1637   CALCIUM 8.6 (L) 10/18/2021 0937   GFRNONAA 20 (L) 10/18/2021 0937   GFRAA >60 10/27/2019  2021   CBC    Component Value Date/Time   WBC 15.5 (H) 10/18/2021 0937   RBC 3.69 (L) 10/18/2021 0937   HGB 10.0 (L) 10/18/2021 0937   HGB 9.5 (L) 09/24/2021 1453   HCT 31.5 (L) 10/18/2021 0937   HCT 28.1 (L) 09/24/2021 1453   PLT 101 (L) 10/18/2021 0937   PLT 248 09/24/2021 1453   MCV 85.4 10/18/2021 0937   MCV 83 09/24/2021 1453   MCH 27.1 10/18/2021 0937   MCHC 31.7 10/18/2021 0937   RDW 19.9 (H) 10/18/2021 0937   RDW 14.3 09/24/2021 1453   LYMPHSABS 2.5 10/18/2021 0100   MONOABS 1.1 (H) 10/18/2021 0100   EOSABS 0.6 (H) 10/18/2021 0100   BASOSABS 0.2 (H) 10/18/2021 0100    Assessment/Plan:   AKI/CKD stage IV - Presumably due to hemodynamic insults in setting of severe sepsis.  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  BUN/Scr continue to rise and still with significant edema.  No uremic symptoms, however I did discuss with her and her mother the need to initiate HD to help with her volume status.  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  Will need to plan for permanent vascular access after her infectious issues have been resolved.  Temp HD catheter placed by IR 10/13/21 and first HD session 10/14/21, then 10/15/21 and 10/17/21.  Will to continue with TTS schedule until she gets an outpatient spot.  Will need a tunneled HD catheter before she can be discharged.  WBC rising.  Blood cultures negative so plan is for [facility] on 10/19/21 by IR. Avoid nephrotoxic medications including NSAIDs and iodinated intravenous contrast exposure unless the latter is absolutely indicated.   Preferred narcotic agents for pain control are hydromorphone, fentanyl, and methadone. Morphine should not be used.  Avoid Baclofen and avoid oral sodium phosphate and magnesium citrate based laxatives / bowel preps.  Continue strict Input and Output monitoring. Will monitor the patient closely with you and  intervene or adjust therapy as indicated by changes in clinical status/labs  Will eventually need TDC and AVF/AVG placement once infection is under control. Will also need outpatient HD arrangements before discharge.  Severe sepsis due to posterior neck abscess - MSSA and currently receiving cefazolin, IV Right breast cellulitis - currently on antibiotics.  Reviewed by surgery and nothing to drain. Hypervolemic hyponatremia - slowly improving with diuresis.  No symptoms. Nephrotic syndrome with wedema - on lasix 120 mg bid with IV albumin to improve response.  UF with HD as tolerated. HTN - stable Anemia of CKD stage IV.  Started ESA, transfuse for Hgb <7. Uncontrolled DM type 1 - per primary Severe protein malnutrition - per primary.   Donetta Potts, MD [facility]

## 2021-10-18 NOTE — Progress Notes (Signed)
PROGRESS NOTE    Lindsey Murillo  UXN:[id number] DOB: 06/25/1995 DOA: 10/01/2021 PCP: Coral Spikes, DO    Chief Complaint  Patient presents with   Leg Swelling    X 2 weeks     Brief Narrative:  Lindsey Murillo is a 26 y.o. female with past medical history significant for is a 26 year old female with past medical history significant for type 1 diabetes mellitus, stage IV CKD with nephrotic syndrome, anemia of chronic medical/renal disease, diabetic neuropathy, HTN who initially presented to [facility] ED on 5/25 with complaints of not feeling well, lower extremity edema, and posterior neck pain with associated drainage.  Mother at bedside assists with HPI given patient's resistance/evasive to questions.  Her mother reports that patient has not been feeling well for the last 2 weeks and noted a area of the posterior left neck that started draining 2 days prior.  Patient/mother denied any trauma to that region.  Pain is worse with any type of range of motion of the neck, nothing seems to make it better.  Mother put some ointment on it which offered her no relief.   In the ED, temperature 98.2 F, HR 102, RR 17, BP 96/67, SPO2 100% on room air.  Sodium 130, potassium 2.7, chloride 107, CO2 14, glucose 179, BUN 66, creatinine 4.76.  BNP 626.0.  WBC 38.9, hemoglobin 8.6, platelet count 330.  Lactic acid 0.7, procalcitonin 1.85.  INR 1.2.  Urinalysis with trace leukocytes, negative nitrite, rare bacteria, 11-20 WBCs.  Pregnancy test negative.  Chest x-ray with cardiomegaly, no active cardiopulmonary disease process.  CT soft tissue neck with area of phlegmon/abscess measuring 4 x 2.4 x 4.8 cm localized upper left neck with enlarged left upper cervical chain lymph nodes.  Blood cultures x2 were drawn.  Patient was started on vancomycin, cefepime, Flagyl.  Initially admitted to [facility].  Valuated by general surgery, Dr. Arnoldo Morale and requested transfer to a higher level of care due to  multiple comorbidities.  Case was discussed with ENT, Dr. Benjamine Mola; and location was out of scope of ENT and recommended transfer to [facility] for general surgery evaluation.  Patient was transferred to [facility] and admitted to the hospital service with general surgery in consultation.   She's now s/p I&D of L side of neck.  Surgery signed off, plan for 10-14 days abx.  R breast without evidence of abscess, plan for outpatient follow up, 10 days doxycycline.  Renal following for aki on CKD, tunneled HD cath placed today.      Assessment & Plan:  Principal Problem:   Sepsis ([place]) Active Problems:   Cellulitis of right breast   Uncontrolled type 1 diabetes mellitus with hyperglycemia ([name])   Pyuria   AKI (acute kidney injury) ([place])   Nephrotic syndrome   Hyponatremia   Peripheral edema   Hypokalemia   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 chronic kidney disease ([name])   Protein-calorie malnutrition, severe ([name])   Physical deconditioning   Hypomagnesemia   Broken tooth   Neck abscess   CKD (chronic kidney disease) stage 4, GFR 15-29 ml/min ([name])    Assessment and Plan: * Sepsis ([place]) Ruled in for sepsis at admission Due to L neck abscess CT with 4x2.4x4.8 cm phlegmon (limited evaluation due to absence of contrast), enlarged L upper chain cervical chain LN S/p I&D 5/28, per surgery -> wet to dry dressing changes BID, santyl to wound base Concern for R breast infection as well, no definite or  focal fluid collection noted on [place] MSSA on 5/28 culture Blood cultures ngtd Vancomycin 5/25 Cefepime 5/25-5/28 linezolid 5/27 - 5/30 Ancef 5/30 - present Narrowed to ancef, continue for now (will plan for at least 14 days, then reeval) Surgery recommending 10 days abx (doxycycline) for R breast Leukocytosis trending back up however patient afebrile. -Check a chest x-ray, UA with cultures and sensitivities, blood cultures. -Repeat labs in the AM.  Cellulitis of right breast US without definite or focal fluid  collection Needs outpatient follow up, this has been arranged by surgery -> follow up with surgery and at breast clinic Surgery recommended 10 days doxy  Pyuria UA with bacteria and yeast - no clear symptoms, suspect contaminant Follow culture only if symptomatic  Uncontrolled type 1 diabetes mellitus with hyperglycemia ([place]) Uncontrolled type I diabetic with a last hemoglobin A1c of 9.8 (09/24/2021). Basal insulin appears it was d/c'd due to hypoglycemia?  Hold cautiously for now (she was just on D5 IVF and still wasn't requiring much insulin) -CBG noted at 118 this morning.  -SSI.   Hyponatremia Overloaded - improved with diuresis and HD. Follow with diuresis  Nephrotic syndrome - Was on albumin and IV Lasix.   -Albumin completed.   -On IV Lasix and no HD.   -Per nephrology.  AKI (acute kidney injury) ([place]) Creatinine 3.14 today - relatively stable with diuresis  Baseline ~3 Renal US negative for hydronephrosis.  Appearred overloaded early on in the hospitalization, LE edema appears to be improving gradually with diuresis and HD. Renal consulted, appreciate assistance - temp HD cath placed  Diuresis with albumin given edema/nephrotic syndrome -Patient also underwent hemodialysis on 10/14/2021, 10/15/2021 per nephrology. 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 -Per nephrology will eventually need TEE DC and AVF/AVG placement once infection under control. -Patient assessed by interventional radiology for placement of dialysis catheter to be planned for Monday, 10/19/2021. -Will need outpatient HD arrangements prior to discharge. -Per nephrology.  Peripheral edema Diuresis with albumin for nephrotic syndrome Per nephrology.  Hypokalemia Repleted.   -On hemodialysis.   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 chronic kidney disease ([name]) Follow Elevated ferritin, low iron - normal folate, elevated b12 S/p 1 unit pRBC -Hemoglobin at 10.0 today from 6.9 (10/17/2021 )  after transfusion of 2 units packed red blood cells during HD on 10/17/2021.   -Patient with no overt bleeding. -Transfusion threshold hemoglobin < 7.  Protein-calorie malnutrition, severe ([place]) - Continue nutritional supplementation.  Physical deconditioning PT  Hypomagnesemia Repleted.  Magnesium noted at 1.9   Broken tooth Upper right Pain related to this Continue abx for now  Needs dental f/u outpatient         DVT prophylaxis: Heparin Code Status: Full Family Communication: Updated patient, no family at bedside. Disposition: TBD  Status is: Inpatient Remains inpatient appropriate because: Severity of illness   Consultants:  General surgery: Dr. Donne Hazel 10/03/2021 Nephrology: Dr. Candiss Norse 10/07/2021 Wound care RN  Procedures:  CT soft tissue neck 10/02/2021 Chest x-ray 10/01/2021, 10/18/2021 Renal ultrasound 10/07/2021 Ultrasound of left breast 10/04/2021 Ultrasound of the head and neck 10/02/2021 Irrigation and debridement neck abscess by Dr. Rosendo Gros 10/04/2021/general surgery. Transfusion 2 units packed red blood cells 10/17/2021 during HD. Transfusion 1 unit packed red blood cells 10/11/2021.    Antimicrobials:  Anti-infectives (From admission, onward)    Start     Dose/Rate Route Frequency Ordered Stop   10/19/21 0600  ceFAZolin (ANCEF) IVPB 2g/100 mL premix  Status:  Discontinued  2 g 200 mL/hr over 30 Minutes Intravenous To Radiology 10/16/21 1125 10/18/21 1250   10/19/21 0600  ceFAZolin (ANCEF) IVPB 1 g/50 mL premix        1 g 100 mL/hr over 30 Minutes Intravenous Every 24 hours 10/18/21 1249     10/13/21 2200  doxycycline (VIBRA-TABS) tablet 100 mg        100 mg Oral Every 12 hours 10/13/21 1719 10/23/21 2159   10/06/21 2200  ceFAZolin (ANCEF) IVPB 1 g/50 mL premix  Status:  Discontinued        1 g 100 mL/hr over 30 Minutes Intravenous Every 12 hours 10/06/21 1052 10/18/21 1249   10/04/21 2200  ceFEPIme (MAXIPIME) 1 g in sodium chloride 0.9 % 100  mL IVPB  Status:  Discontinued        1 g 200 mL/hr over 30 Minutes Intravenous Every 24 hours 10/04/21 1011 10/05/21 1343   10/03/21 2200  vancomycin (VANCOREADY) IVPB 750 mg/150 mL  Status:  Discontinued        750 mg 150 mL/hr over 60 Minutes Intravenous Every 48 hours 10/02/21 0033 10/02/21 1144   10/03/21 1000  linezolid (ZYVOX) IVPB 600 mg  Status:  Discontinued        600 mg 300 mL/hr over 60 Minutes Intravenous Every 12 hours 10/02/21 1144 10/06/21 1052   10/02/21 2200  ceFEPIme (MAXIPIME) 2 g in sodium chloride 0.9 % 100 mL IVPB  Status:  Discontinued        2 g 200 mL/hr over 30 Minutes Intravenous Every 24 hours 10/02/21 0033 10/04/21 1011   10/02/21 0000  ceFEPIme (MAXIPIME) 2 g in sodium chloride 0.9 % 100 mL IVPB        2 g 200 mL/hr over 30 Minutes Intravenous  Once 10/01/21 2358 10/02/21 0246   10/02/21 0000  metroNIDAZOLE (FLAGYL) IVPB 500 mg        500 mg 100 mL/hr over 60 Minutes Intravenous  Once 10/01/21 2358 10/02/21 0246   10/02/21 0000  vancomycin (VANCOCIN) IVPB 1000 mg/200 mL premix        1,000 mg 200 mL/hr over 60 Minutes Intravenous  Once 10/01/21 2358 10/02/21 0244         Subjective: Patient sitting on bedside commode.  No chest pain.  No shortness of breath.  No abdominal pain.  Tolerating current diet.  No family at bedside. .     Objective: Vitals:   10/17/21 2102 10/17/21 2353 10/18/21 0627 10/18/21 1719  BP: 130/88 (!) 136/97 (!) 147/98 (!) 152/95  Pulse: 95 96 94 99  Resp: 17 16 16 16   Temp: 98.3 F (36.8 C) 98.2 F (36.8 C) 97.8 F (36.6 C) 98.5 F (36.9 C)  TempSrc: Oral Oral Oral Oral  SpO2: 97% 98% 97% 95%  Weight:      Height:       No intake or output data in the 24 hours ending 10/18/21 1810  Filed Weights   10/16/21 0500 10/17/21 0414 10/17/21 0900  Weight: 65.7 kg 66 kg 69.7 kg    Examination:  General exam: NAD.  Left neck wound stable. Respiratory system: Bibasilar crackles.  No wheezing.  No rhonchi.  Fair air  movement.  Speaking in full sentences.  Cardiovascular system: RRR no murmurs rubs or gallops.  No JVD.  1+ bilateral lower extremity edema. Gastrointestinal system: Abdomen is nondistended, soft and nontender. No organomegaly or masses felt. Normal bowel sounds heard. Central nervous system: Alert and oriented. No focal  neurological deficits. Extremities: 1+ bilateral lower extremity edema.  Symmetric 5 x 5 power. Skin: No rashes, lesions or ulcers Psychiatry: Judgement and insight appear normal. Mood & affect appropriate.     Data Reviewed:   CBC: Recent Labs  Lab 10/12/21 0336 10/13/21 2233 10/15/21 0156 10/16/21 0811 10/17/21 0149 10/18/21 0100 10/18/21 0937  WBC 13.4*   < > 14.7* 14.6* 16.4* 18.7* 15.5*  NEUTROABS 10.3*  --  11.9*  --   --  14.2*  --   HGB 8.7*   < > 7.2* 7.0* 6.9* 9.4* 10.0*  HCT 25.6*   < > 22.4* 21.7* 21.7* 29.8* 31.5*  MCV 83.4   < > 87.5 88.9 90.0 85.1 85.4  PLT 136*   < > 85* 87* 89* 98* 101*   < > = values in this interval not displayed.    Basic Metabolic Panel: Recent Labs  Lab 10/12/21 0336 10/13/21 0154 10/15/21 0156 10/16/21 0811 10/17/21 0149 10/18/21 0100 10/18/21 0937  NA 130*   < > 134* 137 141 139 138  K 4.2   < > 3.7 4.0 4.1 4.0 4.2  CL 97*   < > 101 104 102 102 103  CO2 20*   < > 24 27 26 28 26   GLUCOSE 94   < > 123* 107* 142* 191* 106*  BUN 69*   < > 50* 38* 40* 23* 23*  CREATININE 5.96*   < > 4.85* 3.97* 4.21* 3.12* 3.14*  CALCIUM 8.1*   < > 7.8* 8.0* 8.5* 8.2* 8.6*  MG 1.9  --  1.9  --   --   --   --   PHOS 5.3*   < > 3.7 3.3 3.5 2.6 2.9   < > = values in this interval not displayed.    GFR: Estimated Creatinine Clearance: 26.2 mL/min (A) (by C-G formula based on SCr of 3.14 mg/dL (H)).  Liver Function Tests: Recent Labs  Lab 10/12/21 0336 10/13/21 0154 10/15/21 0156 10/16/21 0811 10/17/21 0149 10/18/21 0100 10/18/21 0937  AST 12*  --   --   --   --   --   --   ALT <5  --   --   --   --   --   --    ALKPHOS 155*  --   --   --   --   --   --   BILITOT 0.5  --   --   --   --   --   --   PROT 5.3*  --   --   --   --   --   --   ALBUMIN 2.7*   < > 2.3* 2.3* 2.2* 2.1* 2.3*   < > = values in this interval not displayed.    CBG: Recent Labs  Lab 10/17/21 1748 10/17/21 2106 10/18/21 0732 10/18/21 1222 10/18/21 1625  GLUCAP 168* 195* 118* 106* 88     Recent Results (from the past 240 hour(s))  Culture, blood (Routine X 2) w Reflex to ID Panel     Status: None (Preliminary result)   Collection Time: 10/18/21  9:37 AM   Specimen: BLOOD  Result Value Ref Range Status   Specimen Description BLOOD LEFT ANTECUBITAL  Final   Special Requests   Final    BOTTLES DRAWN AEROBIC AND ANAEROBIC Blood Culture adequate volume   Culture   Final    NO GROWTH < 12 HOURS Performed at [facility], [address], [place], [place] [address]    Report Status PENDING  Incomplete  Culture, blood (Routine X 2) w Reflex to ID Panel     Status: None (Preliminary result)   Collection Time: 10/18/21  9:37 AM   Specimen: BLOOD RIGHT HAND  Result Value Ref Range Status   Specimen Description BLOOD RIGHT HAND  Final   Special Requests   Final    BOTTLES DRAWN AEROBIC AND ANAEROBIC Blood Culture adequate volume   Culture   Final    NO GROWTH < 12 HOURS Performed at [facility], [place] [address] [place], [place] [address]    Report Status PENDING  Incomplete         Radiology Studies: DG Chest 2 View  Result Date: 10/18/2021 CLINICAL DATA:  Leg swelling EXAM: CHEST - 2 VIEW COMPARISON:  10/02/2011 FINDINGS: Cardiomegaly. Large-bore right neck multi lumen vascular catheter. New, small, layering bilateral pleural effusions and associated atelectasis or consolidation. Osseous structures unremarkable. IMPRESSION: 1. Cardiomegaly. 2. New, small, layering bilateral pleural effusions and associated atelectasis or consolidation. Electronically Signed   By: Delanna Ahmadi M.D.   On: 10/18/2021  15:19        Scheduled Meds:  sodium chloride   Intravenous Once   acetaminophen  650 mg Oral Q6H   acetaminophen  650 mg Oral Once   calcium acetate  667 mg Oral TID WC   Chlorhexidine Gluconate Cloth  6 each Topical Daily   Chlorhexidine Gluconate Cloth  6 each Topical Q0600   collagenase   Topical Daily   darbepoetin (ARANESP) injection - NON-DIALYSIS  60 mcg Subcutaneous Q Wed-1800   diphenhydrAMINE  25 mg Oral Once   doxycycline  100 mg Oral Q12H   feeding supplement (NEPRO CARB STEADY)  237 mL Oral BID BM   heparin  5,000 Units Subcutaneous Q8H   insulin aspart  0-6 Units Subcutaneous TID WC   magnesium oxide  400 mg Oral Daily   rosuvastatin  10 mg Oral Daily   sodium bicarbonate  1,300 mg Oral TID   tamsulosin  0.4 mg Oral QPC supper   Continuous Infusions:  [START ON 10/19/2021]  ceFAZolin (ANCEF) IV     furosemide 120 mg (10/18/21 0900)     LOS: 16 days    Time spent: 35 minutes    Irine Seal, MD [facility]   To contact the attending provider between 7A-7P or the covering provider during after hours 7P-7A, please log into the web site [email or web address] and access using universal  password for that web site. If you do not have the password, please call the hospital operator.  10/18/2021, 6:10 PM

## 2021-10-19 ENCOUNTER — Ambulatory Visit (HOSPITAL_COMMUNITY): Admission: RE | Admit: 2021-10-19 | Payer: 59 | Source: Home / Self Care

## 2021-10-19 ENCOUNTER — Encounter (HOSPITAL_COMMUNITY): Admission: RE | Payer: Self-pay | Source: Home / Self Care

## 2021-10-19 ENCOUNTER — Inpatient Hospital Stay (HOSPITAL_COMMUNITY): Payer: 59

## 2021-10-19 DIAGNOSIS — N61 Mastitis without abscess: Secondary | ICD-10-CM | POA: Diagnosis not present

## 2021-10-19 DIAGNOSIS — A4101 Sepsis due to Methicillin susceptible Staphylococcus aureus: Secondary | ICD-10-CM | POA: Diagnosis not present

## 2021-10-19 DIAGNOSIS — N179 Acute kidney failure, unspecified: Secondary | ICD-10-CM | POA: Diagnosis not present

## 2021-10-19 DIAGNOSIS — N184 Chronic kidney disease, stage 4 (severe): Secondary | ICD-10-CM | POA: Diagnosis not present

## 2021-10-19 HISTORY — PX: IR FLUORO GUIDE CV LINE RIGHT: IMG2283

## 2021-10-19 LAB — RENAL FUNCTION PANEL
Albumin: 2.1 g/dL — ABNORMAL LOW (ref 3.5–5.0)
Anion gap: 7 (ref 5–15)
BUN: 24 mg/dL — ABNORMAL HIGH (ref 6–20)
CO2: 28 mmol/L (ref 22–32)
Calcium: 8.4 mg/dL — ABNORMAL LOW (ref 8.9–10.3)
Chloride: 103 mmol/L (ref 98–111)
Creatinine, Ser: 3.53 mg/dL — ABNORMAL HIGH (ref 0.44–1.00)
GFR, Estimated: 17 mL/min — ABNORMAL LOW (ref >60)
Glucose, Bld: 85 mg/dL (ref 70–99)
Phosphorus: 3.1 mg/dL (ref 2.5–4.6)
Potassium: 3.7 mmol/L (ref 3.5–5.1)
Sodium: 138 mmol/L (ref 135–145)

## 2021-10-19 LAB — CBC WITH DIFFERENTIAL/PLATELET
Abs Immature Granulocytes: 0.07 10*3/uL (ref 0.00–0.07)
Basophils Absolute: 0.2 10*3/uL — ABNORMAL HIGH (ref 0.0–0.1)
Basophils Relative: 1 %
Eosinophils Absolute: 0.7 10*3/uL — ABNORMAL HIGH (ref 0.0–0.5)
Eosinophils Relative: 4 %
HCT: 30 % — ABNORMAL LOW (ref 36.0–46.0)
Hemoglobin: 9.5 g/dL — ABNORMAL LOW (ref 12.0–15.0)
Immature Granulocytes: 0 %
Lymphocytes Relative: 12 %
Lymphs Abs: 1.9 10*3/uL (ref 0.7–4.0)
MCH: 27.4 pg (ref 26.0–34.0)
MCHC: 31.7 g/dL (ref 30.0–36.0)
MCV: 86.5 fL (ref 80.0–100.0)
Monocytes Absolute: 0.9 10*3/uL (ref 0.1–1.0)
Monocytes Relative: 6 %
Neutro Abs: 12.3 10*3/uL — ABNORMAL HIGH (ref 1.7–7.7)
Neutrophils Relative %: 77 %
Platelets: 107 10*3/uL — ABNORMAL LOW (ref 150–400)
RBC: 3.47 MIL/uL — ABNORMAL LOW (ref 3.87–5.11)
RDW: 19.9 % — ABNORMAL HIGH (ref 11.5–15.5)
WBC: 16.1 10*3/uL — ABNORMAL HIGH (ref 4.0–10.5)
nRBC: 0 % (ref 0.0–0.2)

## 2021-10-19 LAB — BPAM RBC
Blood Product Expiration Date: 202307052359
Blood Product Expiration Date: 202307052359
Blood Product Expiration Date: 202307082359
ISSUE DATE / TIME: 202306100916
ISSUE DATE / TIME: 202306100916
ISSUE DATE / TIME: 202306101615
Unit Type and Rh: 5100
Unit Type and Rh: 6200
Unit Type and Rh: 6200

## 2021-10-19 LAB — TYPE AND SCREEN
ABO/RH(D): A POS
Antibody Screen: NEGATIVE
Unit division: 0
Unit division: 0
Unit division: 0

## 2021-10-19 LAB — GLUCOSE, CAPILLARY
Glucose-Capillary: 100 mg/dL — ABNORMAL HIGH (ref 70–99)
Glucose-Capillary: 99 mg/dL (ref 70–99)
Glucose-Capillary: 77 mg/dL (ref 70–99)
Glucose-Capillary: 82 mg/dL (ref 70–99)

## 2021-10-19 SURGERY — COLONOSCOPY WITH PROPOFOL
Anesthesia: Monitor Anesthesia Care

## 2021-10-19 MED ORDER — MIDAZOLAM HCL 2 MG/2ML IJ SOLN
INTRAMUSCULAR | Status: AC | PRN
  Administered 2021-10-19 (×2): .5 mg via INTRAVENOUS

## 2021-10-19 MED ORDER — LIDOCAINE-EPINEPHRINE 1 %-1:100000 IJ SOLN
INTRAMUSCULAR | Status: AC
  Filled 2021-10-19: qty 1

## 2021-10-19 MED ORDER — FENTANYL CITRATE (PF) 100 MCG/2ML IJ SOLN
INTRAMUSCULAR | Status: AC
  Filled 2021-10-19: qty 2

## 2021-10-19 MED ORDER — LIDOCAINE-EPINEPHRINE 1 %-1:100000 IJ SOLN
INTRAMUSCULAR | Status: AC | PRN
  Administered 2021-10-19: 10 mL

## 2021-10-19 MED ORDER — HEPARIN SODIUM (PORCINE) 1000 UNIT/ML IJ SOLN
INTRAMUSCULAR | Status: AC
  Administered 2021-10-19: 3.2 mL
  Filled 2021-10-19: qty 10

## 2021-10-19 MED ORDER — MIDAZOLAM HCL 2 MG/2ML IJ SOLN
INTRAMUSCULAR | Status: AC
  Filled 2021-10-19: qty 2

## 2021-10-19 MED ORDER — GELATIN ABSORBABLE 12-7 MM EX MISC
CUTANEOUS | Status: AC
  Filled 2021-10-19: qty 1

## 2021-10-19 MED ORDER — FENTANYL CITRATE (PF) 100 MCG/2ML IJ SOLN
INTRAMUSCULAR | Status: AC | PRN
  Administered 2021-10-19 (×2): 25 ug via INTRAVENOUS

## 2021-10-19 MED ORDER — CHLORHEXIDINE GLUCONATE 4 % EX LIQD
CUTANEOUS | Status: AC
  Administered 2021-10-19: 10
  Filled 2021-10-19: qty 15

## 2021-10-19 MED ORDER — RENA-VITE PO TABS
1.0000 | ORAL_TABLET | Freq: Every day | ORAL | Status: DC
  Administered 2021-10-19 – 2021-10-21 (×3): 1 via ORAL
  Filled 2021-10-19 (×3): qty 1

## 2021-10-19 NOTE — Progress Notes (Signed)
Responded to call from patient's mother.   Patient's newly placed right chest HD catheter bleeding at site.  Placed IV consult for line check and dressing change.

## 2021-10-19 NOTE — Consult Note (Signed)
[facility] Nurse wound follow up Wound type: DTPI to right heel,plantar aspect. Patient's mother in room and reports that the patient's shoes do not fit her feet well at home and that the "bruise" is from her dragging her heel. Measurement: 3cm round, flat, blood blister with no fluctuance or elevation. The area is not tender to the touch. There is no warmth, or surrounding induration or edema. Wound bed:As noted above Drainage (amount, consistency, odor) None Periwound:Intact, dry Dressing procedure/placement/frequency: The patient prefers to not wear a boot for pressure redistribution. She is amenable to a dressing. I will order the same today.  [place] nursing team will follow and see patient every 7-10 days, and will remain available to this patient, the nursing and medical teams.    Thank you for inviting [place] to participate in this patient's Plan of Care.  Maudie Flakes, MSN, RN, CNS, [place], Serita Grammes, [employer], [employer] phone:  [phone]

## 2021-10-19 NOTE — Progress Notes (Signed)
IR was contacted again regarding bleeding from [facility] site.   Asked RN to place pressure and IR will reassess at bedside.   Arrive at bedside, RN states that she held pressure for a while but had to leave, states that bleeding never stopped by applying pressure.   Hold pressure for 5 minute, active bleeding noted while holding pressure.   TDC, right chest and neck was cleaned with Chloraprep, sterile gloves were done and Gel foam was inserted into TDC tract using sterile technique.  After intervening for 15 minutes, no further bleeding noted.   Right supper chest was cleaned with sterile NS, Tegarderm was placed.  Observed for 5 minute, no further bleeding noted.  Recommendation - keep HOB flat as much as patient can tolerate - if re-bleed, patient may need to be transferred to radiology for further interventions.   Please call IR for questions and concerns.   Armando Gang Moriah Shawley PA-C 10/19/2021 4:16 PM

## 2021-10-19 NOTE — Progress Notes (Signed)
PROGRESS NOTE    [name]  UXN:[id number] DOB: 06/25/1995 DOA: 10/01/2021 PCP: Coral Spikes, DO    Chief Complaint  Patient presents with   Leg Swelling    X 2 weeks     Brief Narrative:  Lindsey Murillo is a 26 y.o. female with past medical history significant for is a 26 year old female with past medical history significant for type 1 diabetes mellitus, stage IV CKD with nephrotic syndrome, anemia of chronic medical/renal disease, diabetic neuropathy, HTN who initially presented to [facility] ED on 5/25 with complaints of not feeling well, lower extremity edema, and posterior neck pain with associated drainage.  Mother at bedside assists with HPI given patient's resistance/evasive to questions.  Her mother reports that patient has not been feeling well for the last 2 weeks and noted a area of the posterior left neck that started draining 2 days prior.  Patient/mother denied any trauma to that region.  Pain is worse with any type of range of motion of the neck, nothing seems to make it better.  Mother put some ointment on it which offered her no relief.   In the ED, temperature 98.2 F, HR 102, RR 17, BP 96/67, SPO2 100% on room air.  Sodium 130, potassium 2.7, chloride 107, CO2 14, glucose 179, BUN 66, creatinine 4.76.  BNP 626.0.  WBC 38.9, hemoglobin 8.6, platelet count 330.  Lactic acid 0.7, procalcitonin 1.85.  INR 1.2.  Urinalysis with trace leukocytes, negative nitrite, rare bacteria, 11-20 WBCs.  Pregnancy test negative.  Chest x-ray with cardiomegaly, no active cardiopulmonary disease process.  CT soft tissue neck with area of phlegmon/abscess measuring 4 x 2.4 x 4.8 cm localized upper left neck with enlarged left upper cervical chain lymph nodes.  Blood cultures x2 were drawn.  Patient was started on vancomycin, cefepime, Flagyl.  Initially admitted to [facility].  Valuated by general surgery, Dr. Arnoldo Morale and requested transfer to a higher level of care due to  multiple comorbidities.  Case was discussed with ENT, Dr. Benjamine Mola; and location was out of scope of ENT and recommended transfer to [facility] for general surgery evaluation.  Patient was transferred to [facility] and admitted to the hospital service with general surgery in consultation.   She's now s/p I&D of L side of neck.  Surgery signed off, plan for 10-14 days abx.  R breast without evidence of abscess, plan for outpatient follow up, 10 days doxycycline.  Renal following for aki on CKD, tunneled HD cath placed today.      Assessment & Plan:  Principal Problem:   Sepsis ([place]) Active Problems:   Cellulitis of right breast   Uncontrolled type 1 diabetes mellitus with hyperglycemia ([name])   Pyuria   AKI (acute kidney injury) ([place])   Nephrotic syndrome   Hyponatremia   Peripheral edema   Hypokalemia   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 chronic kidney disease ([name])   Protein-calorie malnutrition, severe ([name])   Physical deconditioning   Hypomagnesemia   Broken tooth   Neck abscess   CKD (chronic kidney disease) stage 4, GFR 15-29 ml/min ([name])    Assessment and Plan: * Sepsis ([place]) Ruled in for sepsis at admission Due to L neck abscess CT with 4x2.4x4.8 cm phlegmon (limited evaluation due to absence of contrast), enlarged L upper chain cervical chain LN S/p I&D 5/28, per surgery -> wet to dry dressing changes BID, santyl to wound base Concern for R breast infection as well, no definite or  focal fluid collection noted on [place] MSSA on 5/28 culture Blood cultures ngtd Vancomycin 5/25 Cefepime 5/25-5/28 linezolid 5/27 - 5/30 Ancef 5/30 - present Narrowed to ancef, continue for now (will plan for at least 14 days, then reeval) Surgery recommending 10 days abx (doxycycline) for R breast Leukocytosis was trending back up however trending back down.  Patient afebrile.   -Chest x-ray negative for any acute infiltrate.   -UA pending.   -Blood cultures with no growth to date. -Repeat labs in the  AM.  Cellulitis of right breast US without definite or focal fluid collection Needs outpatient follow up, this has been arranged by surgery -> follow up with surgery and at breast clinic Surgery recommended 10 days doxy  Pyuria UA with bacteria and yeast - no clear symptoms, suspect contaminant Follow culture only if symptomatic  Uncontrolled type 1 diabetes mellitus with hyperglycemia ([place]) Uncontrolled type I diabetic with a last hemoglobin A1c of 9.8 (09/24/2021). Basal insulin appears it was d/c'd due to hypoglycemia?  Hold cautiously for now (she was just on D5 IVF and still wasn't requiring much insulin) -CBG noted at 100 this morning.  -SSI.   Hyponatremia Overloaded - improved with diuresis and HD. Follow with diuresis  Nephrotic syndrome - Was on albumin and IV Lasix.   -Albumin completed.   -On IV Lasix and HD.   -Per nephrology.  AKI (acute kidney injury) ([place]) Creatinine 3.53 today - relatively stable with diuresis  Baseline ~3 Renal US negative for hydronephrosis.  Appearred overloaded early on in the hospitalization, LE edema appears to be improving gradually with diuresis and HD. Renal consulted, appreciate assistance - temp HD cath placed  Diuresis with albumin given edema/nephrotic syndrome -Patient also underwent hemodialysis on 10/14/2021, 10/15/2021 per nephrology. 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 -Per nephrology will eventually need TEE DC and AVF/AVG placement once infection under control. -Patient assessed by interventional radiology s/p placement of dialysis catheter today Monday, 10/19/2021. -Will need outpatient HD arrangements prior to discharge. -Per nephrology.  Peripheral edema Diuresis with albumin for nephrotic syndrome Per nephrology.  Hypokalemia Repleted.   -On hemodialysis.   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 chronic kidney disease ([name]) Follow Elevated ferritin, low iron - normal folate, elevated b12 S/p 1  unit pRBC -Hemoglobin at 9.5 today from 6.9 (10/17/2021 ) after transfusion of 2 units packed red blood cells during HD on 10/17/2021.   -Patient with no overt bleeding. -Transfusion threshold hemoglobin < 7.  Protein-calorie malnutrition, severe ([place]) - Continue nutritional supplementation.  Physical deconditioning PT  Hypomagnesemia Repleted.  Magnesium noted at 1.9   Broken tooth Upper right Pain related to this Continue abx for now  Needs dental f/u outpatient         DVT prophylaxis: Heparin Code Status: Full Family Communication: Updated patient, mother at bedside.   Disposition: TBD  Status is: Inpatient Remains inpatient appropriate because: Severity of illness   Consultants:  General surgery: Dr. Donne Hazel 10/03/2021 Nephrology: Dr. Candiss Norse 10/07/2021 Wound care RN  Procedures:  CT soft tissue neck 10/02/2021 Chest x-ray 10/01/2021, 10/18/2021 Renal ultrasound 10/07/2021 Ultrasound of left breast 10/04/2021 Ultrasound of the head and neck 10/02/2021 Irrigation and debridement neck abscess by Dr. Rosendo Gros 10/04/2021/general surgery. Transfusion 2 units packed red blood cells 10/17/2021 during HD. Transfusion 1 unit packed red blood cells 10/11/2021. Conversion to temp HD line to tunneled HD line per IR, Dr.El-Abd 10/19/2021.    Antimicrobials:  Anti-infectives (From admission, onward)    Start  Dose/Rate Route Frequency Ordered Stop   10/19/21 0600  ceFAZolin (ANCEF) IVPB 2g/100 mL premix  Status:  Discontinued        2 g 200 mL/hr over 30 Minutes Intravenous To Radiology 10/16/21 1125 10/18/21 1250   10/19/21 0600  ceFAZolin (ANCEF) IVPB 1 g/50 mL premix        1 g 100 mL/hr over 30 Minutes Intravenous Every 24 hours 10/18/21 1249     10/13/21 2200  doxycycline (VIBRA-TABS) tablet 100 mg        100 mg Oral Every 12 hours 10/13/21 1719 10/23/21 2159   10/06/21 2200  ceFAZolin (ANCEF) IVPB 1 g/50 mL premix  Status:  Discontinued        1 g 100 mL/hr over 30  Minutes Intravenous Every 12 hours 10/06/21 1052 10/18/21 1249   10/04/21 2200  ceFEPIme (MAXIPIME) 1 g in sodium chloride 0.9 % 100 mL IVPB  Status:  Discontinued        1 g 200 mL/hr over 30 Minutes Intravenous Every 24 hours 10/04/21 1011 10/05/21 1343   10/03/21 2200  vancomycin (VANCOREADY) IVPB 750 mg/150 mL  Status:  Discontinued        750 mg 150 mL/hr over 60 Minutes Intravenous Every 48 hours 10/02/21 0033 10/02/21 1144   10/03/21 1000  linezolid (ZYVOX) IVPB 600 mg  Status:  Discontinued        600 mg 300 mL/hr over 60 Minutes Intravenous Every 12 hours 10/02/21 1144 10/06/21 1052   10/02/21 2200  ceFEPIme (MAXIPIME) 2 g in sodium chloride 0.9 % 100 mL IVPB  Status:  Discontinued        2 g 200 mL/hr over 30 Minutes Intravenous Every 24 hours 10/02/21 0033 10/04/21 1011   10/02/21 0000  ceFEPIme (MAXIPIME) 2 g in sodium chloride 0.9 % 100 mL IVPB        2 g 200 mL/hr over 30 Minutes Intravenous  Once 10/01/21 2358 10/02/21 0246   10/02/21 0000  metroNIDAZOLE (FLAGYL) IVPB 500 mg        500 mg 100 mL/hr over 60 Minutes Intravenous  Once 10/01/21 2358 10/02/21 0246   10/02/21 0000  vancomycin (VANCOCIN) IVPB 1000 mg/200 mL premix        1,000 mg 200 mL/hr over 60 Minutes Intravenous  Once 10/01/21 2358 10/02/21 0244         Subjective: HD catheter placed this morning with some bleeding noted.  Patient denies any chest pain.  No shortness of breath.  No abdominal pain.  Mother at bedside.    Objective: Vitals:   10/19/21 1035 10/19/21 1055 10/19/21 1331 10/19/21 1617  BP: (!) 146/98 129/85 (!) 151/99 (!) 152/97  Pulse: (!) 120 (!) 111 (!) 102 98  Resp: 16 16 16 16   Temp:  97.6 F (36.4 C) (!) 97.5 F (36.4 C) 98.7 F (37.1 C)  TempSrc:  Oral Oral Oral  SpO2: 93% 98% 96% 92%  Weight:      Height:        Intake/Output Summary (Last 24 hours) at 10/19/2021 1829 Last data filed at 10/18/2021 2253 Gross per 24 hour  Intake 150 ml  Output --  Net 150 ml    Filed  Weights   10/16/21 0500 10/17/21 0414 10/17/21 0900  Weight: 65.7 kg 66 kg 69.7 kg    Examination:  General exam: NAD.  Left neck wound stable.  Newly placed HD catheter right upper chest area with some bleeding. Respiratory system: CTA  B anterior lung fields.  No wheezing, no rhonchi.  Fair air movement.   Cardiovascular system: Regular rate rhythm no murmurs rubs or gallops.  No JVD.  1+ bilateral lower extremity edema. Gastrointestinal system: Abdomen is soft, nontender, nondistended, positive bowel sounds.  No rebound.  No guarding. Central nervous system: Alert and oriented. No focal neurological deficits. Extremities: 1+ bilateral lower extremity edema.  Symmetric 5 x 5 power. Skin: No rashes, lesions or ulcers Psychiatry: Judgement and insight appear normal. Mood & affect appropriate.     Data Reviewed:   CBC: Recent Labs  Lab 10/15/21 0156 10/16/21 0811 10/17/21 0149 10/18/21 0100 10/18/21 0937 10/19/21 0404  WBC 14.7* 14.6* 16.4* 18.7* 15.5* 16.1*  NEUTROABS 11.9*  --   --  14.2*  --  12.3*  HGB 7.2* 7.0* 6.9* 9.4* 10.0* 9.5*  HCT 22.4* 21.7* 21.7* 29.8* 31.5* 30.0*  MCV 87.5 88.9 90.0 85.1 85.4 86.5  PLT 85* 87* 89* 98* 101* 107*    Basic Metabolic Panel: Recent Labs  Lab 10/15/21 0156 10/16/21 0811 10/17/21 0149 10/18/21 0100 10/18/21 0937 10/19/21 0404  NA 134* 137 141 139 138 138  K 3.7 4.0 4.1 4.0 4.2 3.7  CL 101 104 102 102 103 103  CO2 24 27 26 28 26 28   GLUCOSE 123* 107* 142* 191* 106* 85  BUN 50* 38* 40* 23* 23* 24*  CREATININE 4.85* 3.97* 4.21* 3.12* 3.14* 3.53*  CALCIUM 7.8* 8.0* 8.5* 8.2* 8.6* 8.4*  MG 1.9  --   --   --   --   --   PHOS 3.7 3.3 3.5 2.6 2.9 3.1    GFR: Estimated Creatinine Clearance: 23.3 mL/min (A) (by C-G formula based on SCr of 3.53 mg/dL (H)).  Liver Function Tests: Recent Labs  Lab 10/16/21 0811 10/17/21 0149 10/18/21 0100 10/18/21 0937 10/19/21 0404  ALBUMIN 2.3* 2.2* 2.1* 2.3* 2.1*    CBG: Recent Labs   Lab 10/18/21 1625 10/18/21 2119 10/19/21 0738 10/19/21 1214 10/19/21 1615  GLUCAP 88 82 100* 99 77     Recent Results (from the past 240 hour(s))  Culture, blood (Routine X 2) w Reflex to ID Panel     Status: None (Preliminary result)   Collection Time: 10/18/21  9:37 AM   Specimen: BLOOD  Result Value Ref Range Status   Specimen Description BLOOD LEFT ANTECUBITAL  Final   Special Requests   Final    BOTTLES DRAWN AEROBIC AND ANAEROBIC Blood Culture adequate volume   Culture   Final    NO GROWTH < 24 HOURS Performed at [facility], [address] [place], [place] [address]    Report Status PENDING  Incomplete  Culture, blood (Routine X 2) w Reflex to ID Panel     Status: None (Preliminary result)   Collection Time: 10/18/21  9:37 AM   Specimen: BLOOD RIGHT HAND  Result Value Ref Range Status   Specimen Description BLOOD RIGHT HAND  Final   Special Requests   Final    BOTTLES DRAWN AEROBIC AND ANAEROBIC Blood Culture adequate volume   Culture   Final    NO GROWTH < 24 HOURS Performed at [facility], [place] [address] [place], [place] [address]    Report Status PENDING  Incomplete         Radiology Studies: IR Fluoro Guide CV Line Right  Result Date: 10/19/2021 INDICATION: Need for long-term dialysis EXAM: Conversion of existing central venous catheter for a tunneled hemodialysis catheter  using fluoroscopic guidance MEDICATIONS: Per EMR ANESTHESIA/SEDATION: Moderate (conscious) sedation was employed during this procedure. A total of Versed 1 mg and Fentanyl 50 mcg was administered intravenously. Moderate Sedation Time: 23 minutes. The patient's level of consciousness and vital signs were monitored continuously by radiology nursing throughout the procedure under my direct supervision. FLUOROSCOPY TIME:  Fluoroscopy Time: 0.5 minutes (5 mGy) COMPLICATIONS: None immediate. PROCEDURE: Informed written consent was obtained from the patient after a thorough  discussion of the procedural risks, benefits and alternatives. All questions were addressed. Maximal Sterile Barrier Technique was utilized including caps, mask, sterile gowns, sterile gloves, sterile drape, hand hygiene and skin antiseptic. A timeout was performed prior to the initiation of the procedure. The patient was placed supine on the exam table. The right neck and chest was prepped and draped in the standard sterile fashion with inclusion of the existing temporary dialysis catheter the sterile field. Over an 035 wire, the existing dialysis catheter was removed for a 14 [place] dilator. Attention was then turned to an infraclavicular location, where a small dermatotomy was made after the administration of additional local anesthetic. From this location, a 19 cm tip-to-cuff hemodialysis catheter was tunneled to the venotomy site. A peel-away sheath was then advanced over the access wire. Through this peel-away sheath, the hemodialysis catheter was advanced into the central veins, such that the tip was positioned near the superior cavoatrial junction. The line was found to flush and aspirate appropriately. It was sutured to the skin using 0 silk suture, and the venotomy was closed with Dermabond. A sterile dressing was placed. The patient tolerated the procedure well without immediate complication. IMPRESSION: Successful conversion of existing central venous catheter for a new tunneled hemodialysis catheter via the right internal jugular vein. The line is ready for immediate use. Electronically Signed   By: Albin Felling M.D.   On: 10/19/2021 11:16   DG Chest 2 View  Result Date: 10/18/2021 CLINICAL DATA:  Leg swelling EXAM: CHEST - 2 VIEW COMPARISON:  10/02/2011 FINDINGS: Cardiomegaly. Large-bore right neck multi lumen vascular catheter. New, small, layering bilateral pleural effusions and associated atelectasis or consolidation. Osseous structures unremarkable. IMPRESSION: 1. Cardiomegaly. 2. New, small,  layering bilateral pleural effusions and associated atelectasis or consolidation. Electronically Signed   By: Delanna Ahmadi M.D.   On: 10/18/2021 15:19        Scheduled Meds:  sodium chloride   Intravenous Once   acetaminophen  650 mg Oral Q6H   acetaminophen  650 mg Oral Once   calcium acetate  667 mg Oral TID WC   Chlorhexidine Gluconate Cloth  6 each Topical Daily   Chlorhexidine Gluconate Cloth  6 each Topical Q0600   collagenase   Topical Daily   darbepoetin (ARANESP) injection - NON-DIALYSIS  60 mcg Subcutaneous Q Wed-1800   diphenhydrAMINE  25 mg Oral Once   doxycycline  100 mg Oral Q12H   feeding supplement (NEPRO CARB STEADY)  237 mL Oral BID BM   gelatin adsorbable       heparin  5,000 Units Subcutaneous Q8H   insulin aspart  0-6 Units Subcutaneous TID WC   lidocaine-EPINEPHrine       lidocaine-EPINEPHrine       magnesium oxide  400 mg Oral Daily   multivitamin  1 tablet Oral QHS   rosuvastatin  10 mg Oral Daily   sodium bicarbonate  1,300 mg Oral TID   tamsulosin  0.4 mg Oral QPC supper   Continuous Infusions:   ceFAZolin (ANCEF)  IV 1 g (10/19/21 0839)   furosemide 120 mg (10/19/21 1750)     LOS: 17 days    Time spent: 35 minutes    Irine Seal, MD [facility]   To contact the attending provider between 7A-7P or the covering provider during after hours 7P-7A, please log into the web site [email or web address] and access using universal [place] password for that web site. If you do not have the password, please call the hospital operator.  10/19/2021, 6:29 PM

## 2021-10-19 NOTE — TOC Initial Note (Signed)
Transition of Care [facility]) - Initial/Assessment Note    Patient Details  Name: Lindsey Murillo MRN: [id number] Date of Birth: 24-Mar-1996  Transition of Care [facility]) CM/SW Contact:    Marilu Favre, RN Phone Number: 10/19/2021, 10:30 AM  Clinical Narrative:                 Patient currently off unit.   Spoke to patient's mother Birdie Riddle at bedside. Mother asking about disability . NCM explained will consult financial counselor. They can apply at [employer]. Mother states they have already applied , she was hoping hospital could expedite. Unfortunately unable. Did ask financial counselor to speak to mother / patient .   Discussed discharge plan. PT/OT to see patient today. CIR following however patient improving and may not need inpatient rehab.   NCM confirmed address and face sheet information. Mother stays with patient. They have a walker patient's grand mother had that patient can use. If needed patient would need to have bedside commode ordered. Discussed home health. Await PT/OT recommendations after evaluation today   Expected Discharge Plan:  (await updated PT eval) Barriers to Discharge: Continued Medical Work up   Patient Goals and CMS Choice        Expected Discharge Plan and Services Expected Discharge Plan:  (await updated PT eval)       Living arrangements for the past 2 months: [place]                                      Prior Living Arrangements/Services Living arrangements for the past 2 months: Single Family Home                     Activities of Daily Living Home Assistive Devices/Equipment: Cane (specify quad or straight) ADL Screening (condition at time of admission) Patient's cognitive ability adequate to safely complete daily activities?: Yes Is the patient deaf or have difficulty hearing?: No Does the patient have difficulty seeing, even when wearing glasses/contacts?: Yes Does the patient have difficulty concentrating,  remembering, or making decisions?: No Patient able to express need for assistance with ADLs?: Yes Does the patient have difficulty dressing or bathing?: Yes Independently performs ADLs?: No Communication: Independent Dressing (OT): Needs assistance Grooming: Needs assistance Feeding: Independent Bathing: Needs assistance Toileting: Needs assistance In/Out Bed: Needs assistance Walks in Home: Independent Does the patient have difficulty walking or climbing stairs?: Yes Weakness of Legs: Both Weakness of Arms/Hands: Both  Permission Sought/Granted                  Emotional Assessment              Admission diagnosis:  SIRS (systemic inflammatory response syndrome) ([place]) [R65.10] Neck abscess [L02.11] Patient Active Problem List   Diagnosis Date Noted   Pyuria 10/08/2021   Hypomagnesemia 10/08/2021   Broken tooth 10/08/2021   CKD (chronic kidney disease) stage 4, GFR 15-29 ml/min ([place]) 10/07/2021   Physical deconditioning 10/07/2021   Hyponatremia 10/07/2021   Cellulitis of right breast 10/04/2021   SIRS (systemic inflammatory response syndrome) ([place]) 10/02/2021   Sepsis ([place]) 10/02/2021   Protein-calorie malnutrition, severe ([place]) 10/02/2021   Neck abscess 10/02/2021   Depression 09/25/2021   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 chronic kidney disease ([place]) 09/25/2021   Normocytic anemia 06/26/2021   Hypokalemia 06/17/2021   Peripheral edema 06/13/2021   Iron deficiency anemia 06/02/2021  Nephrotic syndrome 05/27/2021   Chronic pancreatitis ([place]) 08/22/2019   Pancreatic pseudocyst/cyst 08/22/2019   GERD (gastroesophageal reflux disease) 08/22/2019   AKI (acute kidney injury) ([place])    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

## 2021-10-19 NOTE — Progress Notes (Signed)
Patient ID: Lindsey Murillo, female   DOB: Feb 19, 1996, 26 y.o.   MRN: [id number]  Assessment/Plan:   ESRD - patient with very advanced CKDIV and then additional insults in setting of severe sepsis.  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  Will need to plan for permanent vascular access after her infectious issues have been resolved.  Temp HD catheter placed by IR 10/13/21 and first HD session 10/14/21, then 10/15/21 and 10/17/21.  Will to continue with TTS schedule until she gets an outpatient spot.  Appreciate VIR converting to a tunneled HD catheter on 6/12. Blood cultures negative. Avoid nephrotoxic medications including NSAIDs and iodinated intravenous contrast exposure unless the latter is absolutely indicated.   Preferred narcotic agents for pain control are hydromorphone, fentanyl, and methadone. Morphine should not be used.  Avoid Baclofen and avoid oral sodium phosphate and magnesium citrate based laxatives / bowel preps.  Continue strict Input and Output monitoring. Will monitor the patient closely with you and intervene or adjust therapy as indicated by changes in clinical status/labs  Eventually will need AVF/AVG once infection is under control; probably should perform as an outpatient. She is left handed; will try to move the IV to the left side and restrict the right arm.   Severe sepsis due to posterior neck abscess - MSSA and currently receiving cefazolin, IV Right breast cellulitis - currently on antibiotics.  Reviewed by surgery and nothing to drain. Hypervolemic hyponatremia - slowly improving with diuresis + dialysis.  No symptoms. Nephrotic syndrome with edema - on lasix 120 mg bid with IV albumin to improve response.  UF with HD as tolerated. HTN - stable Anemia of CKD stage IV.  Started ESA, transfuse for Hgb <7. Uncontrolled DM type 1 - per  primary Severe protein malnutrition - per primary.    S: Feels well, no new complaints. Mother bedside.  O:BP 129/85 (BP Location: Left Arm)   Pulse (!) 111   Temp 97.6 F (36.4 C) (Oral)   Resp 16   Ht 5' 7.01" (1.702 m)   Wt 69.7 kg   SpO2 98%   BMI 24.06 kg/m   Intake/Output Summary (Last 24 hours) at 10/19/2021 1237 Last data filed at 10/18/2021 2253 Gross per 24 hour  Intake 150 ml  Output --  Net 150 ml   Intake/Output: I/O last 3 completed shifts: In: 150 [IV Piggyback:150] Out: -   Intake/Output this shift:  No intake/output data recorded. Weight change:  Gen: NAD CVS: RRR Resp:CTA Abd: +BS, soft, NT/ND Ext: 1+ edema BLE Access: RIJ TC  Recent Labs  Lab 10/14/21 0222 10/15/21 0156 10/16/21 0811 10/17/21 0149 10/18/21 0100 10/18/21 0937 10/19/21 0404  NA 132* 134* 137 141 139 138 138  K 3.8 3.7 4.0 4.1 4.0 4.2 3.7  CL 97* 101 104 102 102 103 103  CO2 23 24 27 26 28 26 28   GLUCOSE 143* 123* 107* 142* 191* 106* 85  BUN 72* 50* 38* 40* 23* 23* 24*  CREATININE 6.21* 4.85* 3.97* 4.21* 3.12* 3.14* 3.53*  ALBUMIN 2.6* 2.3* 2.3* 2.2* 2.1* 2.3* 2.1*  CALCIUM 8.2* 7.8* 8.0* 8.5* 8.2* 8.6* 8.4*  PHOS 5.2* 3.7 3.3 3.5 2.6 2.9 3.1   Liver Function Tests: Recent Labs  Lab 10/18/21 0100 10/18/21 0937 10/19/21 0404  ALBUMIN 2.1* 2.3* 2.1*   No results for input(s): "LIPASE", "AMYLASE" in the last 168 hours. No results for input(s): "  AMMONIA" in the last 168 hours. CBC: Recent Labs  Lab 10/15/21 0156 10/16/21 0811 10/17/21 0149 10/18/21 0100 10/18/21 0937 10/19/21 0404  WBC 14.7* 14.6* 16.4* 18.7* 15.5* 16.1*  NEUTROABS 11.9*  --   --  14.2*  --  12.3*  HGB 7.2* 7.0* 6.9* 9.4* 10.0* 9.5*  HCT 22.4* 21.7* 21.7* 29.8* 31.5* 30.0*  MCV 87.5 88.9 90.0 85.1 85.4 86.5  PLT 85* 87* 89* 98* 101* 107*   Cardiac Enzymes: No results for input(s): "CKTOTAL", "CKMB", "CKMBINDEX", "TROPONINI" in the last 168 hours. CBG: Recent Labs  Lab 10/18/21 1222  10/18/21 1625 10/18/21 2119 10/19/21 0738 10/19/21 1214  GLUCAP 106* 88 82 100* 99    Iron Studies: No results for input(s): "IRON", "TIBC", "TRANSFERRIN", "FERRITIN" in the last 72 hours. Studies/Results: IR Fluoro Guide CV Line Right  Result Date: 10/19/2021 INDICATION: Need for long-term dialysis EXAM: Conversion of existing central venous catheter for a tunneled hemodialysis catheter using fluoroscopic guidance MEDICATIONS: Per EMR ANESTHESIA/SEDATION: Moderate (conscious) sedation was employed during this procedure. A total of Versed 1 mg and Fentanyl 50 mcg was administered intravenously. Moderate Sedation Time: 23 minutes. The patient's level of consciousness and vital signs were monitored continuously by radiology nursing throughout the procedure under my direct supervision. FLUOROSCOPY TIME:  Fluoroscopy Time: 0.5 minutes (5 mGy) COMPLICATIONS: None immediate. PROCEDURE: Informed written consent was obtained from the patient after a thorough discussion of the procedural risks, benefits and alternatives. All questions were addressed. Maximal Sterile Barrier Technique was utilized including caps, mask, sterile gowns, sterile gloves, sterile drape, hand hygiene and skin antiseptic. A timeout was performed prior to the initiation of the procedure. The patient was placed supine on the exam table. The right neck and chest was prepped and draped in the standard sterile fashion with inclusion of the existing temporary dialysis catheter the sterile field. Over an 035 wire, the existing dialysis catheter was removed for a 14 [place] dilator. Attention was then turned to an infraclavicular location, where a small dermatotomy was made after the administration of additional local anesthetic. From this location, a 19 cm tip-to-cuff hemodialysis catheter was tunneled to the venotomy site. A peel-away sheath was then advanced over the access wire. Through this peel-away sheath, the hemodialysis catheter was  advanced into the central veins, such that the tip was positioned near the superior cavoatrial junction. The line was found to flush and aspirate appropriately. It was sutured to the skin using 0 silk suture, and the venotomy was closed with Dermabond. A sterile dressing was placed. The patient tolerated the procedure well without immediate complication. IMPRESSION: Successful conversion of existing central venous catheter for a new tunneled hemodialysis catheter via the right internal jugular vein. The line is ready for immediate use. Electronically Signed   By: Albin Felling M.D.   On: 10/19/2021 11:16   DG Chest 2 View  Result Date: 10/18/2021 CLINICAL DATA:  Leg swelling EXAM: CHEST - 2 VIEW COMPARISON:  10/02/2011 FINDINGS: Cardiomegaly. Large-bore right neck multi lumen vascular catheter. New, small, layering bilateral pleural effusions and associated atelectasis or consolidation. Osseous structures unremarkable. IMPRESSION: 1. Cardiomegaly. 2. New, small, layering bilateral pleural effusions and associated atelectasis or consolidation. Electronically Signed   By: Delanna Ahmadi M.D.   On: 10/18/2021 15:19    sodium chloride   Intravenous Once   acetaminophen  650 mg Oral Q6H   acetaminophen  650 mg Oral Once   calcium acetate  667 mg Oral TID WC   Chlorhexidine Gluconate Cloth  6 each Topical Daily   Chlorhexidine Gluconate Cloth  6 each Topical Q0600   collagenase   Topical Daily   darbepoetin (ARANESP) injection - NON-DIALYSIS  60 mcg Subcutaneous Q Wed-1800   diphenhydrAMINE  25 mg Oral Once   doxycycline  100 mg Oral Q12H   feeding supplement (NEPRO CARB STEADY)  237 mL Oral BID BM   gelatin adsorbable       heparin  5,000 Units Subcutaneous Q8H   insulin aspart  0-6 Units Subcutaneous TID WC   lidocaine-EPINEPHrine       lidocaine-EPINEPHrine       magnesium oxide  400 mg Oral Daily   rosuvastatin  10 mg Oral Daily   sodium bicarbonate  1,300 mg Oral TID   tamsulosin  0.4 mg Oral  QPC supper    BMET    Component Value Date/Time   NA 138 10/19/2021 0404   NA 132 (L) 09/24/2021 1453   K 3.7 10/19/2021 0404   CL 103 10/19/2021 0404   CO2 28 10/19/2021 0404   GLUCOSE 85 10/19/2021 0404   BUN 24 (H) 10/19/2021 0404   BUN 36 (H) 09/24/2021 1453   CREATININE 3.53 (H) 10/19/2021 0404   CREATININE 0.98 08/29/2019 1637   CALCIUM 8.4 (L) 10/19/2021 0404   GFRNONAA 17 (L) 10/19/2021 0404   GFRAA >60 10/27/2019 2021   CBC    Component Value Date/Time   WBC 16.1 (H) 10/19/2021 0404   RBC 3.47 (L) 10/19/2021 0404   HGB 9.5 (L) 10/19/2021 0404   HGB 9.5 (L) 09/24/2021 1453   HCT 30.0 (L) 10/19/2021 0404   HCT 28.1 (L) 09/24/2021 1453   PLT 107 (L) 10/19/2021 0404   PLT 248 09/24/2021 1453   MCV 86.5 10/19/2021 0404   MCV 83 09/24/2021 1453   MCH 27.4 10/19/2021 0404   MCHC 31.7 10/19/2021 0404   RDW 19.9 (H) 10/19/2021 0404   RDW 14.3 09/24/2021 1453   LYMPHSABS 1.9 10/19/2021 0404   MONOABS 0.9 10/19/2021 0404   EOSABS 0.7 (H) 10/19/2021 0404   BASOSABS 0.2 (H) 10/19/2021 0404

## 2021-10-19 NOTE — Progress Notes (Signed)
Referral submitted to [facility] admissions on Friday. Contacted [facility] admissions this morning for update. [facility] is in need of clinicals to be re-sent for review. Clinicals re-faxed this am. Will await clinic assignment/approval.   Melven Sartorius Renal Navigator [phone]

## 2021-10-19 NOTE — Progress Notes (Signed)
  Inpatient Rehabilitation Admissions Coordinator   I met with patient and her Mom at bedside. On 6/7 she ambulated 520 feet with therapy and is progressing well. I told patient and Mom that patient unlikely to need CIR admit at this level and they are in agreement.  Danne Baxter, RN, MSN Rehab Admissions Coordinator [phone] 10/19/2021 12:12 PM

## 2021-10-19 NOTE — Progress Notes (Signed)
New Dialysis Start   Patient identified as new dialysis start. Kidney Education packet assembled and given. Discussed the following items with patient:    Current medications and possible changes once started:  Discussed that patient's medications may change over time.  Ex; hypertension medications and diabetes medication.  Nephrologists will adjust as needed.  Fluid restrictions reviewed:  32 oz daily goal:  All liquids count; soups, ice, jello   Phosphorus and potassium: Handout given showing high potassium and phosphorus foods.  Alternative food and drink options given.  Family support:  mother present at bedside.  Reviewed information with patient and patient's mother.  Outpatient Clinic Resources:  Discussed roles of Outpatient clinic  staff and advised to make a list of needs, if any, to talk with outpatient staff if needed  Care plan schedule: Informed patient and family member of Care Plans in outpatient setting and to participate in the care plan.  An invitation would be given from outpatient clinic.   Dialysis Access Options:  Reviewed access options with patients. Discussed in detail about care at home with new AVG & AVF. Reviewed checking bruit and thrill. If dialysis catheter present, educated that patient could not take showers.  Catheter dressing changes were to be done by outpatient clinic staff only  Home therapy options:  Educated patient about home therapy options:  PD vs home hemo.     Patient verbalized understanding. Will continue to round on patient during admission.    Lilia Argue, RN

## 2021-10-19 NOTE — Procedures (Signed)
Interventional Radiology Procedure Note  Date of Procedure: 10/19/2021  Procedure: Conversion to temp HD line to tunneled HD line   Findings:  1. Conversion to temp HD line to tunneled HD line via right IJ with 19cm tip-to-cuff line    Complications: No immediate complications noted.   Estimated Blood Loss: minimal  Follow-up and Recommendations: 1. Ready for use    Albin Felling, MD  Vascular & Interventional Radiology  10/19/2021 10:52 AM

## 2021-10-19 NOTE — Progress Notes (Signed)
IR was contacted due to bleeding from perm cath site.   Patient seen at bedside.  Sitting in bed, NAD. Mother and RNs at bedside.  RN states that the bleeding was mainly from the medical end of insertion site of the perm cath, did not appear to be from the tract.  RN just placed a new dressing.   Dressing appears clean.  Observed the dressing for 5 minute, no further bleeding noted.  No intervention required.   Recommendations - keep HOB low to reduce pressure on IJ puncture site  - notify IR if further bleeding found   Please call IR for questions and concerns.   Armando Gang Kaiesha Tonner PA-C 10/19/2021 12:59 PM

## 2021-10-19 NOTE — Progress Notes (Signed)
Patient still bleeding at the HD cath site.  Dr. Grandville Silos made aware.

## 2021-10-19 NOTE — Progress Notes (Signed)
Nutrition Follow-up  DOCUMENTATION CODES:  Not applicable  INTERVENTION:  Continue Nepro Shake po BID, each supplement provides 425 kcal and 19 grams protein Liberalize diet back to carb modified with 1500 mL fluid restriction. K WNL and pt had poor intake with renal diet Renavite daily  NUTRITION DIAGNOSIS:  Increased nutrient needs related to  (sepsis) as evidenced by estimated needs. - remains applicable  GOAL:  Patient will meet greater than or equal to 90% of their needs - progressing, supplements in place  MONITOR:  PO intake, Supplement acceptance  REASON FOR ASSESSMENT:  Consult Assessment of nutrition requirement/status  ASSESSMENT:  Pt with PMH off type 1 DM, stage IV CKD with nephrotic syndrome, anemia of chronic disease, diabetic neuropathy, and HTN admitted with posterior neck abscess and severe sepsis.  5/28 - Op, I&D of posterior neck abscess  6/6 - temporary HD cath placed, HD initiated 6/12 - Tunneled HD cath placed  Pt resting in bed at the time of assessment. Pt had tunneled IJ placed earlier and is currently laying flat as her site is still bleeding. Discussed appetite, much improved after diet was adjusted to carb modified. Will continue carb modified diet (changed to renal after procedure today) and also add renavite to support increased needs with HD.  Average Meal Intake: 5/29-6/4: 38% intake x 3 recorded meals 6/5-6/12: 71% intake x 7 recorded meals  Nutritionally Relevant Medications: Scheduled Meds:  calcium acetate  667 mg Oral TID WC   diphenhydrAMINE  25 mg Oral Once   doxycycline  100 mg Oral Q12H   NEPRO CARB STEADY  237 mL Oral BID BM   insulin aspart  0-6 Units Subcutaneous TID WC   magnesium oxide  400 mg Oral Daily   rosuvastatin  10 mg Oral Daily   sodium bicarbonate  1,300 mg Oral TID   Continuous Infusions:   ceFAZolin (ANCEF) IV 1 g (10/19/21 0839)   furosemide 120 mg (10/19/21 1105)   PRN Meds: ondansetron, polyethylene  glycol  Labs Reviewed: BUN 24, creatinine 3.53 CBG ranges from 82-118 mg/dL over the last 24 hours HgbA1c 9.8% (5/18)  NUTRITION - FOCUSED PHYSICAL EXAM: Flowsheet Row Most Recent Value  Orbital Region No depletion  Upper Arm Region No depletion  Thoracic and Lumbar Region No depletion  Buccal Region No depletion  Temple Region No depletion  Clavicle Bone Region No depletion  Clavicle and Acromion Bone Region No depletion  Scapular Bone Region Unable to assess  Dorsal Hand No depletion  Patellar Region Moderate depletion  Anterior Thigh Region Moderate depletion  Posterior Calf Region Unable to assess  [edema]  Edema (RD Assessment) Moderate  Hair Reviewed  Eyes Reviewed  Mouth Reviewed  Skin Reviewed  [bleeding neck abscess, red scabbed R breast abscess, multiple areas of scabbed/discolored from previous sores]  Nails Reviewed   Diet Order:   Diet Order             Diet Carb Modified Fluid consistency: Thin; Room service appropriate? Yes; Fluid restriction: 1500 mL Fluid  Diet effective now                  EDUCATION NEEDS:  Education needs have been addressed  Skin:  Skin Assessment: Reviewed RN Assessment (L neck abscess, R breast scab)  Last BM:  6/11 - type 6  Height:  Ht Readings from Last 1 Encounters:  10/04/21 5' 7.01" (1.702 m)   Weight:  Wt Readings from Last 1 Encounters:  10/17/21 69.7 kg  Ideal Body Weight:  67.3 kg  BMI:  Body mass index is 24.06 kg/m.  Estimated Nutritional Needs:  Kcal:  1900-2100 Protein:  100-115 grams Fluid:  >1.9 L/day   Ranell Patrick, RD, LDN Clinical Dietitian RD pager # available in [facility]  After hours/weekend pager # available in [facility]

## 2021-10-19 NOTE — Progress Notes (Addendum)
Called to assess newly placed [facility] due to bleeding. At bedside to see that there was blood that had ran down under her arm and across her chest. Removed dressing , cleaned site and noticed a trickle of blood from the insertion site. Pressure dressing placed. IR at bedside to evaluate the site as well. If it continues to bleed recommendations would be for IR to place a suture at insertion site.

## 2021-10-19 NOTE — Progress Notes (Signed)
PT Cancellation Note  Patient Details Name: Lindsey Murillo MRN: [id number] DOB: 12-30-1995   Cancelled Treatment:    Reason Eval/Treat Not Completed: Patient at procedure or test/unavailable   [name], PT, DPT Acute Rehabilitation Services Pager [phone] Office [phone]    [name] 10/19/2021, 10:48 AM

## 2021-10-19 NOTE — Progress Notes (Signed)
   10/19/21 1055  Assess: MEWS Score  Temp 97.6 F (36.4 C)  BP 129/85  MAP (mmHg) 97  Pulse Rate (!) 111  Resp 16  Level of Consciousness Alert  SpO2 98 %  O2 Device Room Air  Assess: MEWS Score  MEWS Temp 0  MEWS Systolic 0  MEWS Pulse 2  MEWS RR 0  MEWS LOC 0  MEWS Score 2  MEWS Score Color Yellow  Assess: if the MEWS score is Yellow or Red  Were vital signs taken at a resting state? Yes  Focused Assessment No change from prior assessment  Does the patient meet 2 or more of the SIRS criteria? No  MEWS guidelines implemented *See Row Information* Yes  Treat  MEWS Interventions Administered scheduled meds/treatments  Pain Scale 0-10  Pain Score 0  Take Vital Signs  Increase Vital Sign Frequency  Yellow: Q 2hr X 2 then Q 4hr X 2, if remains yellow, continue Q 4hrs  Escalate  MEWS: Escalate Yellow: discuss with charge nurse/RN and consider discussing with provider and RRT  Notify: Charge Nurse/RN  Name of Charge Nurse/RN Notified Memory Dance RN  Date Charge Nurse/RN Notified 10/19/21  Time Charge Nurse/RN Notified 1127  Notify: Provider  Provider Name/Title Dr. Grandville Silos  Date Provider Notified 10/19/21  Time Provider Notified 1127  Method of Notification Call  Notification Reason Other (Comment) (yellow MEWS)  Provider response In department  Date of Provider Response 10/19/21  Time of Provider Response 1128  Notify: Rapid Response  Name of Rapid Response RN Notified Not applicable  Document  Patient Outcome Other (Comment) (Patient has been stable.  Patient has just returned from HD cath placement)  Progress note created (see row info) Yes  Assess: SIRS CRITERIA  SIRS Temperature  0  SIRS Pulse 1  SIRS Respirations  0  SIRS WBC 0  SIRS Score Sum  1   Patient just received back from HD catheter placement.  No complaints offered.  No distress.

## 2021-10-20 DIAGNOSIS — N179 Acute kidney failure, unspecified: Secondary | ICD-10-CM | POA: Diagnosis not present

## 2021-10-20 DIAGNOSIS — N184 Chronic kidney disease, stage 4 (severe): Secondary | ICD-10-CM | POA: Diagnosis not present

## 2021-10-20 DIAGNOSIS — N61 Mastitis without abscess: Secondary | ICD-10-CM | POA: Diagnosis not present

## 2021-10-20 DIAGNOSIS — A4101 Sepsis due to Methicillin susceptible Staphylococcus aureus: Secondary | ICD-10-CM | POA: Diagnosis not present

## 2021-10-20 DIAGNOSIS — Z992 Dependence on renal dialysis: Secondary | ICD-10-CM | POA: Insufficient documentation

## 2021-10-20 LAB — GLUCOSE, CAPILLARY
Glucose-Capillary: 83 mg/dL (ref 70–99)
Glucose-Capillary: 107 mg/dL — ABNORMAL HIGH (ref 70–99)
Glucose-Capillary: 167 mg/dL — ABNORMAL HIGH (ref 70–99)
Glucose-Capillary: 113 mg/dL — ABNORMAL HIGH (ref 70–99)

## 2021-10-20 LAB — RENAL FUNCTION PANEL
Albumin: 2.1 g/dL — ABNORMAL LOW (ref 3.5–5.0)
Anion gap: 8 (ref 5–15)
BUN: 26 mg/dL — ABNORMAL HIGH (ref 6–20)
CO2: 28 mmol/L (ref 22–32)
Calcium: 8.4 mg/dL — ABNORMAL LOW (ref 8.9–10.3)
Chloride: 101 mmol/L (ref 98–111)
Creatinine, Ser: 3.83 mg/dL — ABNORMAL HIGH (ref 0.44–1.00)
GFR, Estimated: 16 mL/min — ABNORMAL LOW (ref >60)
Glucose, Bld: 104 mg/dL — ABNORMAL HIGH (ref 70–99)
Phosphorus: 3.4 mg/dL (ref 2.5–4.6)
Potassium: 3.3 mmol/L — ABNORMAL LOW (ref 3.5–5.1)
Sodium: 137 mmol/L (ref 135–145)

## 2021-10-20 LAB — CBC WITH DIFFERENTIAL/PLATELET
Abs Immature Granulocytes: 0.05 10*3/uL (ref 0.00–0.07)
Basophils Absolute: 0.2 10*3/uL — ABNORMAL HIGH (ref 0.0–0.1)
Basophils Relative: 2 %
Eosinophils Absolute: 0.6 10*3/uL — ABNORMAL HIGH (ref 0.0–0.5)
Eosinophils Relative: 4 %
HCT: 28.9 % — ABNORMAL LOW (ref 36.0–46.0)
Hemoglobin: 9.1 g/dL — ABNORMAL LOW (ref 12.0–15.0)
Immature Granulocytes: 0 %
Lymphocytes Relative: 14 %
Lymphs Abs: 2 10*3/uL (ref 0.7–4.0)
MCH: 27.6 pg (ref 26.0–34.0)
MCHC: 31.5 g/dL (ref 30.0–36.0)
MCV: 87.6 fL (ref 80.0–100.0)
Monocytes Absolute: 0.8 10*3/uL (ref 0.1–1.0)
Monocytes Relative: 6 %
Neutro Abs: 11 10*3/uL — ABNORMAL HIGH (ref 1.7–7.7)
Neutrophils Relative %: 74 %
Platelets: 116 10*3/uL — ABNORMAL LOW (ref 150–400)
RBC: 3.3 MIL/uL — ABNORMAL LOW (ref 3.87–5.11)
RDW: 19.9 % — ABNORMAL HIGH (ref 11.5–15.5)
WBC: 14.7 10*3/uL — ABNORMAL HIGH (ref 4.0–10.5)
nRBC: 0 % (ref 0.0–0.2)

## 2021-10-20 NOTE — Progress Notes (Signed)
PT Cancellation Note  Patient Details Name: Lindsey Murillo MRN: [id number] DOB: 1995/07/14   Cancelled Treatment:    Reason Eval/Treat Not Completed: Patient at procedure or test/unavailable (HD)  [name], PT, [place] Office [place] 10/20/2021, 8:35 AM

## 2021-10-20 NOTE — Progress Notes (Signed)
Patient ID: Lindsey Murillo, female   DOB: 12/30/1995, 26 y.o.   MRN: [id number]  Assessment/Plan:   ESRD - patient with very advanced CKDIV and then additional insults in setting of severe sepsis.  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  Will need to plan for permanent vascular access after her infectious issues have been resolved.  Temp HD catheter placed by IR 10/13/21 and first HD session 10/14/21, then 10/15/21 and 10/17/21.  Appreciate VIR converting to a tunneled HD catheter on 6/12. Blood cultures negative. Avoid nephrotoxic medications including NSAIDs and iodinated intravenous contrast exposure unless the latter is absolutely indicated.   Preferred narcotic agents for pain control are hydromorphone, fentanyl, and methadone. Morphine should not be used.  Avoid Baclofen and avoid oral sodium phosphate and magnesium citrate based laxatives / bowel preps.  Continue strict Input and Output monitoring. Will monitor the patient closely with you and intervene or adjust therapy as indicated by changes in clinical status/labs  Eventually will need AVF/AVG once infection is under control; probably should perform as an outpatient. She is left handed; will try to move the IV to the left side and restrict the right arm. Will to continue with TTS schedule until she gets an outpatient spot.  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  Reviewed by surgery and nothing to drain. Hypervolemic hyponatremia - slowly improving with diuresis + dialysis.  No symptoms. Nephrotic syndrome with edema - on lasix 120 mg bid with IV albumin to improve response.  UF with HD as tolerated. HTN -  stable Anemia of CKD stage IV.  Started ESA, transfuse for Hgb <7. Uncontrolled DM type 1 - per primary Severe protein malnutrition - per primary.    S: Feels well, no new complaints.  O:BP (!) 169/102 (BP Location: Right Arm)   Pulse 98   Temp 97.7 F (36.5 C) (Oral)   Resp (!) 22   Ht 5' 7.01" (1.702 m)   Wt 57.4 kg   SpO2 95%   BMI 19.81 kg/m   Intake/Output Summary (Last 24 hours) at 10/20/2021 0842 Last data filed at 10/20/2021 0346 Gross per 24 hour  Intake 552 ml  Output 4 ml  Net 548 ml   Intake/Output: I/O last 3 completed shifts: In: 923 [P.O.:340; IV Piggyback:412] Out: 4 [Urine:2; Stool:2]  Intake/Output this shift:  No intake/output data recorded. Weight change:  Gen: NAD CVS: RRR Resp:CTA Abd: +BS, soft, NT/ND Ext: 1+ edema BLE, L>R Access: RIJ TC  Recent Labs  Lab 10/15/21 0156 10/16/21 0811 10/17/21 0149 10/18/21 0100 10/18/21 0937 10/19/21 0404 10/20/21 0049  NA 134* 137 141 139 138 138 137  K 3.7 4.0 4.1 4.0 4.2 3.7 3.3*  CL 101 104 102 102 103 103 101  CO2 24 27 26 28 26 28 28   GLUCOSE 123* 107* 142* 191* 106* 85 104*  BUN 50* 38* 40* 23* 23* 24* 26*  CREATININE 4.85* 3.97* 4.21* 3.12* 3.14* 3.53* 3.83*  ALBUMIN 2.3* 2.3* 2.2* 2.1* 2.3* 2.1* 2.1*  CALCIUM 7.8* 8.0* 8.5* 8.2* 8.6* 8.4* 8.4*  PHOS 3.7 3.3 3.5 2.6 2.9 3.1 3.4   Liver Function Tests: Recent Labs  Lab 10/18/21 3007 10/19/21 0404 10/20/21 0049  ALBUMIN 2.3* 2.1* 2.1*   No results for input(s): "LIPASE", "AMYLASE" in the last 168 hours. No results for input(s): "AMMONIA" in the last 168 hours. CBC: Recent Labs  Lab 10/17/21 0149 10/18/21 0100 10/18/21 0937 10/19/21 0404 10/20/21 0049  WBC 16.4* 18.7* 15.5* 16.1* 14.7*  NEUTROABS  --  14.2*  --  12.3* 11.0*  HGB 6.9* 9.4* 10.0* 9.5* 9.1*  HCT 21.7* 29.8* 31.5* 30.0* 28.9*  MCV 90.0 85.1 85.4 86.5 87.6  PLT 89* 98* 101* 107* 116*   Cardiac Enzymes: No results for input(s): "CKTOTAL", "CKMB", "CKMBINDEX",  "TROPONINI" in the last 168 hours. CBG: Recent Labs  Lab 10/18/21 2119 10/19/21 0738 10/19/21 1214 10/19/21 1615 10/19/21 2111  GLUCAP 82 100* 99 77 82    Iron Studies: No results for input(s): "IRON", "TIBC", "TRANSFERRIN", "FERRITIN" in the last 72 hours. Studies/Results: IR Fluoro Guide CV Line Right  Result Date: 10/19/2021 INDICATION: Need for long-term dialysis EXAM: Conversion of existing central venous catheter for a tunneled hemodialysis catheter using fluoroscopic guidance MEDICATIONS: Per EMR ANESTHESIA/SEDATION: Moderate (conscious) sedation was employed during this procedure. A total of Versed 1 mg and Fentanyl 50 mcg was administered intravenously. Moderate Sedation Time: 23 minutes. The patient's level of consciousness and vital signs were monitored continuously by radiology nursing throughout the procedure under my direct supervision. FLUOROSCOPY TIME:  Fluoroscopy Time: 0.5 minutes (5 mGy) COMPLICATIONS: None immediate. PROCEDURE: Informed written consent was obtained from the patient after a thorough discussion of the procedural risks, benefits and alternatives. All questions were addressed. Maximal Sterile Barrier Technique was utilized including caps, mask, sterile gowns, sterile gloves, sterile drape, hand hygiene and skin antiseptic. A timeout was performed prior to the initiation of the procedure. The patient was placed supine on the exam table. The right neck and chest was prepped and draped in the standard sterile fashion with inclusion of the existing temporary dialysis catheter the sterile field. Over an 035 wire, the existing dialysis catheter was removed for a 14 [place] dilator. Attention was then turned to an infraclavicular location, where a small dermatotomy was made after the administration of additional local anesthetic. From this location, a 19 cm tip-to-cuff hemodialysis catheter was tunneled to the venotomy site. A peel-away sheath was then advanced over the  access wire. Through this peel-away sheath, the hemodialysis catheter was advanced into the central veins, such that the tip was positioned near the superior cavoatrial junction. The line was found to flush and aspirate appropriately. It was sutured to the skin using 0 silk suture, and the venotomy was closed with Dermabond. A sterile dressing was placed. The patient tolerated the procedure well without immediate complication. IMPRESSION: Successful conversion of existing central venous catheter for a new tunneled hemodialysis catheter via the right internal jugular vein. The line is ready for immediate use. Electronically Signed   By: Albin Felling M.D.   On: 10/19/2021 11:16   DG Chest 2 View  Result Date: 10/18/2021 CLINICAL DATA:  Leg swelling EXAM: CHEST - 2 VIEW COMPARISON:  10/02/2011 FINDINGS: Cardiomegaly. Large-bore right neck multi lumen vascular catheter. New, small, layering bilateral pleural effusions and associated atelectasis or consolidation. Osseous structures unremarkable. IMPRESSION: 1. Cardiomegaly. 2. New, small, layering bilateral pleural effusions and associated atelectasis or consolidation. Electronically Signed   By: Delanna Ahmadi M.D.   On: 10/18/2021 15:19    sodium chloride   Intravenous Once   acetaminophen  650 mg Oral Q6H   acetaminophen  650 mg Oral Once   calcium acetate  667 mg Oral TID WC   Chlorhexidine Gluconate Cloth  6 each Topical Daily   Chlorhexidine Gluconate Cloth  6 each Topical Q0600   collagenase   Topical Daily   darbepoetin (ARANESP) injection - NON-DIALYSIS  60 mcg Subcutaneous Q Wed-1800   diphenhydrAMINE  25 mg Oral Once   doxycycline  100 mg Oral Q12H   feeding supplement (NEPRO CARB STEADY)  237 mL Oral BID BM   heparin  5,000 Units Subcutaneous Q8H   insulin aspart  0-6 Units Subcutaneous TID WC   magnesium oxide  400 mg Oral Daily   multivitamin  1 tablet Oral QHS   rosuvastatin  10 mg Oral Daily   sodium bicarbonate  1,300 mg Oral TID    tamsulosin  0.4 mg Oral QPC supper    BMET    Component Value Date/Time   NA 137 10/20/2021 0049   NA 132 (L) 09/24/2021 1453   K 3.3 (L) 10/20/2021 0049   CL 101 10/20/2021 0049   CO2 28 10/20/2021 0049   GLUCOSE 104 (H) 10/20/2021 0049   BUN 26 (H) 10/20/2021 0049   BUN 36 (H) 09/24/2021 1453   CREATININE 3.83 (H) 10/20/2021 0049   CREATININE 0.98 08/29/2019 1637   CALCIUM 8.4 (L) 10/20/2021 0049   GFRNONAA 16 (L) 10/20/2021 0049   GFRAA >60 10/27/2019 2021   CBC    Component Value Date/Time   WBC 14.7 (H) 10/20/2021 0049   RBC 3.30 (L) 10/20/2021 0049   HGB 9.1 (L) 10/20/2021 0049   HGB 9.5 (L) 09/24/2021 1453   HCT 28.9 (L) 10/20/2021 0049   HCT 28.1 (L) 09/24/2021 1453   PLT 116 (L) 10/20/2021 0049   PLT 248 09/24/2021 1453   MCV 87.6 10/20/2021 0049   MCV 83 09/24/2021 1453   MCH 27.6 10/20/2021 0049   MCHC 31.5 10/20/2021 0049   RDW 19.9 (H) 10/20/2021 0049   RDW 14.3 09/24/2021 1453   LYMPHSABS 2.0 10/20/2021 0049   MONOABS 0.8 10/20/2021 0049   EOSABS 0.6 (H) 10/20/2021 0049   BASOSABS 0.2 (H) 10/20/2021 0049

## 2021-10-20 NOTE — Progress Notes (Signed)
Physical Therapy Treatment Patient Details Name: Lindsey Murillo MRN: [id number] DOB: May 16, 1995 Today's Date: 10/20/2021   History of Present Illness Lindsey Murillo is a 26 y.o. female  initially presented to [facility] ED on 5/25 with complaints of not feeling well, lower extremity edema, and posterior neck pain with associated drainage.  Mother at bedside assists with HPI given patient's resistance/evasive to questions.  Her mother reports that patient has not been feeling well for the 2 weeks prior and noted an area of the posterior left neck that started draining 2 days prior to admit. Transferred to [facility] on 5/26 for possible surgery.  Pt underwent I&D left neck abscess on 5/28. Pt with severe sepsis.  Also with right breast abscess that is being watched.  PMH: type 1 diabetes mellitus, stage IV CKD with nephrotic syndrome, anemia of chronic medical/renal disease, diabetic neuropathy, HTN    PT Comments    Pt demonstrated good tolerance to functional activity. Pt requires modA for transfers and ambulated 544ft with RW and min guard. Gait quality and balance limited by observed weakness in B hips. Pt able to negotiate 1 step with +2 assist and rails. Pt would continue to benefit from acute PT to address deficits in strength and balance. Based on progression, anticipate pt will be able to discharge home versus AIR. Pt would be appropriate for HHPT upon discharge to continue to address deficits.    Recommendations for follow up therapy are one component of a multi-disciplinary discharge planning process, led by the attending physician.  Recommendations may be updated based on patient status, additional functional criteria and insurance authorization.  Follow Up Recommendations  Home health PT     Assistance Recommended at Discharge Frequent or constant Supervision/Assistance  Patient can return home with the following A little help with walking and/or transfers;A little help with  bathing/dressing/bathroom;Assistance with cooking/housework;Assist for transportation;Help with stairs or ramp for entrance   Equipment Recommendations  Rolling walker (2 wheels)    Recommendations for Other Services       Precautions / Restrictions Precautions Precautions: Fall Restrictions Weight Bearing Restrictions: No     Mobility  Bed Mobility Overal bed mobility: Needs Assistance Bed Mobility: Supine to Sit     Supine to sit: Supervision     General bed mobility comments: HOB elevated, supervision for safety    Transfers Overall transfer level: Needs assistance Equipment used: Rolling walker (2 wheels) Transfers: Sit to/from Stand Sit to Stand: Mod assist           General transfer comment: modA to power up from low bed, verbal cues for hand placement    Ambulation/Gait Ambulation/Gait assistance: Min guard, +2 safety/equipment (chair follow) Gait Distance (Feet): 500 Feet Assistive device: Rolling walker (2 wheels) Gait Pattern/deviations: Step-through pattern, Decreased stride length, Trunk flexed, Wide base of support Gait velocity: decreased Gait velocity interpretation: 1.31 - 2.62 ft/sec, indicative of limited community ambulator   General Gait Details: intoeing bilaterally, increased upper body lateral sway to assist LE advancement due to hip weakness.   Stairs Stairs: Yes Stairs assistance: Min assist, +2 physical assistance Stair Management: One rail Left, Two rails, Step to pattern Number of Stairs: 1 General stair comments: B rails ascending, one rail descending with HHA. Pt with increased cricumduction of LEs to advance up to step due to hip flexor weakness, maintaining wide BOS for balance   Wheelchair Mobility    Modified Rankin (Stroke Patients Only)       Balance Overall balance  assessment: Needs assistance Sitting-balance support: No upper extremity supported, Feet supported Sitting balance-Leahy Scale: Good     Standing  balance support: Bilateral upper extremity supported, During functional activity Standing balance-Leahy Scale: Poor Standing balance comment: reliant on RW for support                            Cognition Arousal/Alertness: Awake/alert Behavior During Therapy: Flat affect Overall Cognitive Status: Impaired/Different from baseline Area of Impairment: Following commands, Safety/judgement, Problem solving                       Following Commands: Follows one step commands consistently, Follows one step commands with increased time Safety/Judgement: Decreased awareness of safety, Decreased awareness of deficits   Problem Solving: Slow processing, Decreased initiation, Difficulty sequencing, Requires verbal cues General Comments: Requiring increased time to answer questions, appears not to fully understand what therapist is asking        Exercises      General Comments        Pertinent Vitals/Pain Pain Assessment Pain Assessment: No/denies pain    Home Living                          Prior Function            PT Goals (current goals can now be found in the care plan section) Acute Rehab PT Goals Patient Stated Goal: did not state other than to return to bed PT Goal Formulation: With patient/family Time For Goal Achievement: 11/03/21 Potential to Achieve Goals: Fair Progress towards PT goals: Progressing toward goals    Frequency    Min 3X/week      PT Plan Discharge plan needs to be updated    Co-evaluation              AM-PAC PT "6 Clicks" Mobility   Outcome Measure  Help needed turning from your back to your side while in a flat bed without using bedrails?: None Help needed moving from lying on your back to sitting on the side of a flat bed without using bedrails?: None Help needed moving to and from a bed to a chair (including a wheelchair)?: A Lot Help needed standing up from a chair using your arms (e.g., wheelchair  or bedside chair)?: A Lot Help needed to walk in hospital room?: A Little Help needed climbing 3-5 steps with a railing? : A Lot 6 Click Score: 17    End of Session Equipment Utilized During Treatment: Gait belt Activity Tolerance: Patient tolerated treatment well Patient left: in bed;with call bell/phone within reach Nurse Communication: Mobility status PT Visit Diagnosis: Unsteadiness on feet (R26.81);Muscle weakness (generalized) (M62.81);Pain;Difficulty in walking, not elsewhere classified (R26.2)     Time: 4081-4481 PT Time Calculation (min) (ACUTE ONLY): 22 min  Charges:  $Gait Training: 8-22 mins                     Mackie Pai, SPT Acute Rehabilitation Services  Office: [place] 10/20/2021, 5:14 PM

## 2021-10-20 NOTE — Progress Notes (Signed)
PROGRESS NOTE    Lindsey Murillo  UXN:[id number] DOB: 06/25/1995 DOA: 10/01/2021 PCP: Coral Spikes, DO    Chief Complaint  Patient presents with   Leg Swelling    X 2 weeks     Brief Narrative:  Lindsey Murillo is a 26 y.o. female with past medical history significant for is a 26 year old female with past medical history significant for type 1 diabetes mellitus, stage IV CKD with nephrotic syndrome, anemia of chronic medical/renal disease, diabetic neuropathy, HTN who initially presented to [facility] ED on 5/25 with complaints of not feeling well, lower extremity edema, and posterior neck pain with associated drainage.  Mother at bedside assists with HPI given patient's resistance/evasive to questions.  Her mother reports that patient has not been feeling well for the last 2 weeks and noted a area of the posterior left neck that started draining 2 days prior.  Patient/mother denied any trauma to that region.  Pain is worse with any type of range of motion of the neck, nothing seems to make it better.  Mother put some ointment on it which offered her no relief.   In the ED, temperature 98.2 F, HR 102, RR 17, BP 96/67, SPO2 100% on room air.  Sodium 130, potassium 2.7, chloride 107, CO2 14, glucose 179, BUN 66, creatinine 4.76.  BNP 626.0.  WBC 38.9, hemoglobin 8.6, platelet count 330.  Lactic acid 0.7, procalcitonin 1.85.  INR 1.2.  Urinalysis with trace leukocytes, negative nitrite, rare bacteria, 11-20 WBCs.  Pregnancy test negative.  Chest x-ray with cardiomegaly, no active cardiopulmonary disease process.  CT soft tissue neck with area of phlegmon/abscess measuring 4 x 2.4 x 4.8 cm localized upper left neck with enlarged left upper cervical chain lymph nodes.  Blood cultures x2 were drawn.  Patient was started on vancomycin, cefepime, Flagyl.  Initially admitted to [facility].  Valuated by general surgery, Dr. Arnoldo Morale and requested transfer to a higher level of care due to  multiple comorbidities.  Case was discussed with ENT, Dr. Benjamine Mola; and location was out of scope of ENT and recommended transfer to [facility] for general surgery evaluation.  Patient was transferred to [facility] and admitted to the hospital service with general surgery in consultation.   She's now s/p I&D of L side of neck.  Surgery signed off, plan for 10-14 days abx.  R breast without evidence of abscess, plan for outpatient follow up, 10 days doxycycline.  Renal following for aki on CKD, tunneled HD cath placed today.      Assessment & Plan:  Principal Problem:   Sepsis ([place]) Active Problems:   Cellulitis of right breast   Uncontrolled type 1 diabetes mellitus with hyperglycemia ([name])   Pyuria   AKI (acute kidney injury) ([place])   Nephrotic syndrome   Hyponatremia   Peripheral edema   Hypokalemia   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 chronic kidney disease ([name])   Protein-calorie malnutrition, severe ([name])   Physical deconditioning   Hypomagnesemia   Broken tooth   Neck abscess   CKD (chronic kidney disease) stage 4, GFR 15-29 ml/min ([name])    Assessment and Plan: * Sepsis ([place]) Ruled in for sepsis at admission Due to L neck abscess CT with 4x2.4x4.8 cm phlegmon (limited evaluation due to absence of contrast), enlarged L upper chain cervical chain LN S/p I&D 5/28, per surgery -> wet to dry dressing changes BID, santyl to wound base Concern for R breast infection as well, no definite or  focal fluid collection noted on [place] MSSA on 5/28 culture Blood cultures ngtd Vancomycin 5/25 Cefepime 5/25-5/28 linezolid 5/27 - 5/30 Ancef 5/30 - present Narrowed to ancef, continue for now (will plan for at least 14 days, then reeval) Surgery recommending 10 days abx (doxycycline) for R breast Leukocytosis was trending back up however trending back down.  Patient afebrile.   -Chest x-ray negative for any acute infiltrate.   -UA pending.   -Blood cultures with no growth to date. -Repeat labs in the  AM.  Cellulitis of right breast US without definite or focal fluid collection Needs outpatient follow up, this has been arranged by surgery -> follow up with surgery and at breast clinic Surgery recommended 10 days doxy  Pyuria UA with bacteria and yeast - no clear symptoms, suspect contaminant Follow culture only if symptomatic  Uncontrolled type 1 diabetes mellitus with hyperglycemia ([place]) Uncontrolled type I diabetic with a last hemoglobin A1c of 9.8 (09/24/2021). Basal insulin appears it was d/c'd due to hypoglycemia?  Hold cautiously for now (she was just on D5 IVF and still wasn't requiring much insulin) -CBG noted at 107 this morning.  -SSI.   Hyponatremia Overloaded - improved with diuresis and HD. Follow with diuresis  Nephrotic syndrome - Was on albumin and IV Lasix.   -Albumin completed.   -On IV Lasix and HD.   -Per nephrology.  AKI (acute kidney injury) ([place]) Creatinine 3.83 today - relatively stable with diuresis  Baseline ~3 Renal US negative for hydronephrosis.  Appearred overloaded early on in the hospitalization, LE edema appears to be improving gradually with diuresis and HD. Renal consulted, appreciate assistance - temp HD cath placed  Diuresis with albumin given edema/nephrotic syndrome -Patient also underwent hemodialysis on 10/14/2021, 10/15/2021 per nephrology. 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 -Per nephrology will eventually need TEE DC and AVF/AVG placement once infection under control. -Patient assessed by interventional radiology s/p placement of dialysis catheter Monday, 10/19/2021. -Will need outpatient HD arrangements prior to discharge. -Per nephrology.  Peripheral edema Diuresis with albumin for nephrotic syndrome Per nephrology.  Hypokalemia -On hemodialysis. -Per nephrology.   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 chronic kidney disease ([name]) Follow Elevated ferritin, low iron - normal folate, elevated b12 S/p 1 unit  pRBC -Hemoglobin at 9.1 today from 6.9 (10/17/2021 ) after transfusion of 2 units packed red blood cells during HD on 10/17/2021.   -Patient with no overt bleeding. -Transfusion threshold hemoglobin < 7.  Protein-calorie malnutrition, severe ([place]) - Continue nutritional supplementation.  Physical deconditioning PT  Hypomagnesemia Repleted.  Magnesium noted at 1.9   Broken tooth Upper right Pain related to this Continue abx for now  Needs dental f/u outpatient         DVT prophylaxis: Heparin Code Status: Full Family Communication: Updated patient, mother at bedside.   Disposition: TBD  Status is: Inpatient Remains inpatient appropriate because: Severity of illness   Consultants:  General surgery: Dr. Donne Hazel 10/03/2021 Nephrology: Dr. Candiss Norse 10/07/2021 Wound care RN  Procedures:  CT soft tissue neck 10/02/2021 Chest x-ray 10/01/2021, 10/18/2021 Renal ultrasound 10/07/2021 Ultrasound of left breast 10/04/2021 Ultrasound of the head and neck 10/02/2021 Irrigation and debridement neck abscess by Dr. Rosendo Gros 10/04/2021/general surgery. Transfusion 2 units packed red blood cells 10/17/2021 during HD. Transfusion 1 unit packed red blood cells 10/11/2021. Conversion to temp HD line to tunneled HD line per IR, Dr.El-Abd 10/19/2021.    Antimicrobials:  Anti-infectives (From admission, onward)    Start  Dose/Rate Route Frequency Ordered Stop   10/19/21 0600  ceFAZolin (ANCEF) IVPB 2g/100 mL premix  Status:  Discontinued        2 g 200 mL/hr over 30 Minutes Intravenous To Radiology 10/16/21 1125 10/18/21 1250   10/19/21 0600  ceFAZolin (ANCEF) IVPB 1 g/50 mL premix        1 g 100 mL/hr over 30 Minutes Intravenous Every 24 hours 10/18/21 1249     10/13/21 2200  doxycycline (VIBRA-TABS) tablet 100 mg        100 mg Oral Every 12 hours 10/13/21 1719 10/23/21 2159   10/06/21 2200  ceFAZolin (ANCEF) IVPB 1 g/50 mL premix  Status:  Discontinued        1 g 100 mL/hr over 30  Minutes Intravenous Every 12 hours 10/06/21 1052 10/18/21 1249   10/04/21 2200  ceFEPIme (MAXIPIME) 1 g in sodium chloride 0.9 % 100 mL IVPB  Status:  Discontinued        1 g 200 mL/hr over 30 Minutes Intravenous Every 24 hours 10/04/21 1011 10/05/21 1343   10/03/21 2200  vancomycin (VANCOREADY) IVPB 750 mg/150 mL  Status:  Discontinued        750 mg 150 mL/hr over 60 Minutes Intravenous Every 48 hours 10/02/21 0033 10/02/21 1144   10/03/21 1000  linezolid (ZYVOX) IVPB 600 mg  Status:  Discontinued        600 mg 300 mL/hr over 60 Minutes Intravenous Every 12 hours 10/02/21 1144 10/06/21 1052   10/02/21 2200  ceFEPIme (MAXIPIME) 2 g in sodium chloride 0.9 % 100 mL IVPB  Status:  Discontinued        2 g 200 mL/hr over 30 Minutes Intravenous Every 24 hours 10/02/21 0033 10/04/21 1011   10/02/21 0000  ceFEPIme (MAXIPIME) 2 g in sodium chloride 0.9 % 100 mL IVPB        2 g 200 mL/hr over 30 Minutes Intravenous  Once 10/01/21 2358 10/02/21 0246   10/02/21 0000  metroNIDAZOLE (FLAGYL) IVPB 500 mg        500 mg 100 mL/hr over 60 Minutes Intravenous  Once 10/01/21 2358 10/02/21 0246   10/02/21 0000  vancomycin (VANCOCIN) IVPB 1000 mg/200 mL premix        1,000 mg 200 mL/hr over 60 Minutes Intravenous  Once 10/01/21 2358 10/02/21 0244         Subjective: Patient in hemodialysis.  No chest pain.  No shortness of breath.  No abdominal pain.  Concerned that the left lower extremity slightly bigger than the right lower extremity however states overall swelling since admission has improved.  No further bleeding from HD catheter site.   Objective: Vitals:   10/20/21 0840 10/20/21 0900 10/20/21 0924 10/20/21 1217  BP: (!) 160/102 (!) 154/103 (!) 155/101   Pulse: 96 92 84   Resp: 20 19 20    Temp:      TempSrc:      SpO2: 96% 94% 95%   Weight:    55.6 kg  Height:        Intake/Output Summary (Last 24 hours) at 10/20/2021 1736 Last data filed at 10/20/2021 0346 Gross per 24 hour  Intake 382  ml  Output 4 ml  Net 378 ml    Filed Weights   10/20/21 0345 10/20/21 0804 10/20/21 1217  Weight: 66.7 kg 57.4 kg 55.6 kg    Examination:  General exam: NAD.  Left neck wound stable.  Newly placed HD catheter right upper chest area.  Respiratory system: CTA B anterior lung fields.  No wheezes, no cough crackles, no rhonchi.  Fair air movement.  Cardiovascular system: RRR no murmurs rubs or gallops.  No JVD.  Trace to 1+ bilateral lower extremity edema.  Left lower extremity slightly bigger than right lower extremity.  Gastrointestinal system: Abdomen is soft, nontender, nondistended, positive bowel sounds.  No rebound.  No guarding. Central nervous system: Alert and oriented. No focal neurological deficits. Extremities: Trace to 1+ bilateral lower extremity edema.  Symmetric 5 x 5 power. Skin: No rashes, lesions or ulcers Psychiatry: Judgement and insight appear normal. Mood & affect appropriate.     Data Reviewed:   CBC: Recent Labs  Lab 10/15/21 0156 10/16/21 0811 10/17/21 0149 10/18/21 0100 10/18/21 0937 10/19/21 0404 10/20/21 0049  WBC 14.7*   < > 16.4* 18.7* 15.5* 16.1* 14.7*  NEUTROABS 11.9*  --   --  14.2*  --  12.3* 11.0*  HGB 7.2*   < > 6.9* 9.4* 10.0* 9.5* 9.1*  HCT 22.4*   < > 21.7* 29.8* 31.5* 30.0* 28.9*  MCV 87.5   < > 90.0 85.1 85.4 86.5 87.6  PLT 85*   < > 89* 98* 101* 107* 116*   < > = values in this interval not displayed.    Basic Metabolic Panel: Recent Labs  Lab 10/15/21 0156 10/16/21 0811 10/17/21 0149 10/18/21 0100 10/18/21 0937 10/19/21 0404 10/20/21 0049  NA 134*   < > 141 139 138 138 137  K 3.7   < > 4.1 4.0 4.2 3.7 3.3*  CL 101   < > 102 102 103 103 101  CO2 24   < > 26 28 26 28 28   GLUCOSE 123*   < > 142* 191* 106* 85 104*  BUN 50*   < > 40* 23* 23* 24* 26*  CREATININE 4.85*   < > 4.21* 3.12* 3.14* 3.53* 3.83*  CALCIUM 7.8*   < > 8.5* 8.2* 8.6* 8.4* 8.4*  MG 1.9  --   --   --   --   --   --   PHOS 3.7   < > 3.5 2.6 2.9 3.1 3.4    < > = values in this interval not displayed.    GFR: Estimated Creatinine Clearance: 19.4 mL/min (A) (by C-G formula based on SCr of 3.83 mg/dL (H)).  Liver Function Tests: Recent Labs  Lab 10/17/21 0149 10/18/21 0100 10/18/21 0937 10/19/21 0404 10/20/21 0049  ALBUMIN 2.2* 2.1* 2.3* 2.1* 2.1*    CBG: Recent Labs  Lab 10/19/21 1615 10/19/21 2111 10/20/21 0836 10/20/21 1430 10/20/21 1703  GLUCAP 77 82 83 107* 167*     Recent Results (from the past 240 hour(s))  Culture, blood (Routine X 2) w Reflex to ID Panel     Status: None (Preliminary result)   Collection Time: 10/18/21  9:37 AM   Specimen: BLOOD  Result Value Ref Range Status   Specimen Description BLOOD LEFT ANTECUBITAL  Final   Special Requests   Final    BOTTLES DRAWN AEROBIC AND ANAEROBIC Blood Culture adequate volume   Culture   Final    NO GROWTH 2 DAYS Performed at [facility] [address] [place], [place] [address]    Report Status PENDING  Incomplete  Culture, blood (Routine X 2) w Reflex to ID Panel     Status: None (Preliminary result)   Collection Time: 10/18/21  9:37 AM   Specimen: BLOOD RIGHT HAND  Result Value  Ref Range Status   Specimen Description BLOOD RIGHT HAND  Final   Special Requests   Final    BOTTLES DRAWN AEROBIC AND ANAEROBIC Blood Culture adequate volume   Culture   Final    NO GROWTH 2 DAYS Performed at [facility], [address] [name], [place] [address]    Report Status PENDING  Incomplete         Radiology Studies: IR Fluoro Guide CV Line Right  Result Date: 10/19/2021 INDICATION: Need for long-term dialysis EXAM: Conversion of existing central venous catheter for a tunneled hemodialysis catheter using fluoroscopic guidance MEDICATIONS: Per EMR ANESTHESIA/SEDATION: Moderate (conscious) sedation was employed during this procedure. A total of Versed 1 mg and Fentanyl 50 mcg was administered intravenously. Moderate Sedation Time: 23 minutes. The  patient's level of consciousness and vital signs were monitored continuously by radiology nursing throughout the procedure under my direct supervision. FLUOROSCOPY TIME:  Fluoroscopy Time: 0.5 minutes (5 mGy) COMPLICATIONS: None immediate. PROCEDURE: Informed written consent was obtained from the patient after a thorough discussion of the procedural risks, benefits and alternatives. All questions were addressed. Maximal Sterile Barrier Technique was utilized including caps, mask, sterile gowns, sterile gloves, sterile drape, hand hygiene and skin antiseptic. A timeout was performed prior to the initiation of the procedure. The patient was placed supine on the exam table. The right neck and chest was prepped and draped in the standard sterile fashion with inclusion of the existing temporary dialysis catheter the sterile field. Over an 035 wire, the existing dialysis catheter was removed for a 14 [place] dilator. Attention was then turned to an infraclavicular location, where a small dermatotomy was made after the administration of additional local anesthetic. From this location, a 19 cm tip-to-cuff hemodialysis catheter was tunneled to the venotomy site. A peel-away sheath was then advanced over the access wire. Through this peel-away sheath, the hemodialysis catheter was advanced into the central veins, such that the tip was positioned near the superior cavoatrial junction. The line was found to flush and aspirate appropriately. It was sutured to the skin using 0 silk suture, and the venotomy was closed with Dermabond. A sterile dressing was placed. The patient tolerated the procedure well without immediate complication. IMPRESSION: Successful conversion of existing central venous catheter for a new tunneled hemodialysis catheter via the right internal jugular vein. The line is ready for immediate use. Electronically Signed   By: Albin Felling M.D.   On: 10/19/2021 11:16        Scheduled Meds:  sodium  chloride   Intravenous Once   acetaminophen  650 mg Oral Q6H   acetaminophen  650 mg Oral Once   calcium acetate  667 mg Oral TID WC   Chlorhexidine Gluconate Cloth  6 each Topical Daily   Chlorhexidine Gluconate Cloth  6 each Topical Q0600   collagenase   Topical Daily   darbepoetin (ARANESP) injection - NON-DIALYSIS  60 mcg Subcutaneous Q Wed-1800   diphenhydrAMINE  25 mg Oral Once   doxycycline  100 mg Oral Q12H   feeding supplement (NEPRO CARB STEADY)  237 mL Oral BID BM   heparin  5,000 Units Subcutaneous Q8H   insulin aspart  0-6 Units Subcutaneous TID WC   magnesium oxide  400 mg Oral Daily   multivitamin  1 tablet Oral QHS   rosuvastatin  10 mg Oral Daily   sodium bicarbonate  1,300 mg Oral TID   tamsulosin  0.4 mg Oral QPC supper   Continuous  Infusions:   ceFAZolin (ANCEF) IV 1 g (10/20/21 1716)   furosemide 120 mg (10/20/21 1720)     LOS: 18 days    Time spent: 35 minutes    Irine Seal, MD [facility]   To contact the attending provider between 7A-7P or the covering provider during after hours 7P-7A, please log into the web site [email or web address] and access using universal [place] password for that web site. If you do not have the password, please call the hospital operator.  10/20/2021, 5:36 PM

## 2021-10-20 NOTE — Progress Notes (Signed)
Spoke to [place] this morning. Clinic is reviewing pt's case and will hopefully provide update later today.   Melven Sartorius Renal Navigator [phone]

## 2021-10-21 DIAGNOSIS — D689 Coagulation defect, unspecified: Secondary | ICD-10-CM | POA: Insufficient documentation

## 2021-10-21 DIAGNOSIS — Z23 Encounter for immunization: Secondary | ICD-10-CM | POA: Insufficient documentation

## 2021-10-21 DIAGNOSIS — N2581 Secondary hyperparathyroidism of renal origin: Secondary | ICD-10-CM | POA: Insufficient documentation

## 2021-10-21 DIAGNOSIS — A4101 Sepsis due to Methicillin susceptible Staphylococcus aureus: Secondary | ICD-10-CM | POA: Diagnosis not present

## 2021-10-21 DIAGNOSIS — R52 Pain, unspecified: Secondary | ICD-10-CM | POA: Insufficient documentation

## 2021-10-21 DIAGNOSIS — N032 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: Secondary | ICD-10-CM | POA: Insufficient documentation

## 2021-10-21 DIAGNOSIS — L83 Acanthosis nigricans: Secondary | ICD-10-CM | POA: Insufficient documentation

## 2021-10-21 DIAGNOSIS — N61 Mastitis without abscess: Secondary | ICD-10-CM | POA: Diagnosis not present

## 2021-10-21 DIAGNOSIS — T7840XA Allergy, unspecified, initial encounter: Secondary | ICD-10-CM | POA: Insufficient documentation

## 2021-10-21 DIAGNOSIS — N179 Acute kidney failure, unspecified: Secondary | ICD-10-CM | POA: Diagnosis not present

## 2021-10-21 DIAGNOSIS — E048 Other specified nontoxic goiter: Secondary | ICD-10-CM | POA: Insufficient documentation

## 2021-10-21 DIAGNOSIS — N184 Chronic kidney disease, stage 4 (severe): Secondary | ICD-10-CM | POA: Diagnosis not present

## 2021-10-21 DIAGNOSIS — L299 Pruritus, unspecified: Secondary | ICD-10-CM | POA: Insufficient documentation

## 2021-10-21 DIAGNOSIS — R110 Nausea: Secondary | ICD-10-CM | POA: Insufficient documentation

## 2021-10-21 DIAGNOSIS — R0600 Dyspnea, unspecified: Secondary | ICD-10-CM | POA: Insufficient documentation

## 2021-10-21 DIAGNOSIS — D509 Iron deficiency anemia, unspecified: Secondary | ICD-10-CM | POA: Insufficient documentation

## 2021-10-21 HISTORY — DX: 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: N03.2

## 2021-10-21 LAB — RENAL FUNCTION PANEL
Albumin: 2.2 g/dL — ABNORMAL LOW (ref 3.5–5.0)
Anion gap: 6 (ref 5–15)
BUN: 12 mg/dL (ref 6–20)
CO2: 30 mmol/L (ref 22–32)
Calcium: 8.1 mg/dL — ABNORMAL LOW (ref 8.9–10.3)
Chloride: 103 mmol/L (ref 98–111)
Creatinine, Ser: 2.56 mg/dL — ABNORMAL HIGH (ref 0.44–1.00)
GFR, Estimated: 26 mL/min — ABNORMAL LOW (ref >60)
Glucose, Bld: 108 mg/dL — ABNORMAL HIGH (ref 70–99)
Phosphorus: 2.1 mg/dL — ABNORMAL LOW (ref 2.5–4.6)
Potassium: 3.6 mmol/L (ref 3.5–5.1)
Sodium: 139 mmol/L (ref 135–145)

## 2021-10-21 LAB — CBC
HCT: 29.8 % — ABNORMAL LOW (ref 36.0–46.0)
Hemoglobin: 9.4 g/dL — ABNORMAL LOW (ref 12.0–15.0)
MCH: 27.4 pg (ref 26.0–34.0)
MCHC: 31.5 g/dL (ref 30.0–36.0)
MCV: 86.9 fL (ref 80.0–100.0)
Platelets: 140 10*3/uL — ABNORMAL LOW (ref 150–400)
RBC: 3.43 MIL/uL — ABNORMAL LOW (ref 3.87–5.11)
RDW: 19.5 % — ABNORMAL HIGH (ref 11.5–15.5)
WBC: 16.6 10*3/uL — ABNORMAL HIGH (ref 4.0–10.5)
nRBC: 0 % (ref 0.0–0.2)

## 2021-10-21 LAB — GLUCOSE, CAPILLARY
Glucose-Capillary: 97 mg/dL (ref 70–99)
Glucose-Capillary: 104 mg/dL — ABNORMAL HIGH (ref 70–99)
Glucose-Capillary: 109 mg/dL — ABNORMAL HIGH (ref 70–99)
Glucose-Capillary: 122 mg/dL — ABNORMAL HIGH (ref 70–99)

## 2021-10-21 MED ORDER — DARBEPOETIN ALFA 60 MCG/0.3ML IJ SOSY: 60.0000 ug | PREFILLED_SYRINGE | INTRAMUSCULAR | Status: DC

## 2021-10-21 NOTE — Progress Notes (Signed)
Patient ID: Lindsey Murillo, female   DOB: [birth year], 26 y.o.   MRN: [id number]  Assessment/Plan:   ESRD - patient with very advanced CKDIV and then additional insults in setting of severe sepsis.  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  Will need to plan for permanent vascular access after her infectious issues have been resolved.  Temp HD catheter placed by IR 10/13/21 and first HD session 10/14/21, then 10/15/21 and 10/17/21.  Appreciate VIR converting to a tunneled HD catheter on 6/12. Blood cultures negative. Avoid nephrotoxic medications including NSAIDs and iodinated intravenous contrast exposure unless the latter is absolutely indicated.   Preferred narcotic agents for pain control are hydromorphone, fentanyl, and methadone. Morphine should not be used.  Avoid Baclofen and avoid oral sodium phosphate and magnesium citrate based laxatives / bowel preps.  Continue strict Input and Output monitoring. Will monitor the patient closely with you and intervene or adjust therapy as indicated by changes in clinical status/labs. Stopped the oral HCO3 and MgOxide, Lasix.  Eventually will need AVF/AVG once infection is under control; probably should perform as an outpatient. She is left handed. Will to continue with TTS schedule until she gets an outpatient spot.  Tolerated HD 6/13 w/ 2L UF RIJ TC.    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  Reviewed by surgery and nothing to drain. On Ancef and Doxy.  Hypervolemic hyponatremia - slowly improving with diuresis + dialysis.  No symptoms. Nephrotic syndrome with edema - on lasix 120 mg bid with IV albumin to improve response.  UF with HD as tolerated. HTN -  stable Anemia of CKD stage IV.  Started ESA (60SQ on 6/7), transfuse for Hgb <7 -> change to IV. Uncontrolled DM type 1 - per primary Severe protein malnutrition - per primary.    S: Feels well, no new complaints. Mother bedside.  O:BP (!) 157/101 (BP Location: Right Arm)   Pulse 96   Temp (!) 97.4 F (36.3 C) (Oral)   Resp 15   Ht 5' 7.01" (1.702 m)   Wt 55.6 kg   SpO2 96%   BMI 19.19 kg/m  No intake or output data in the 24 hours ending 10/21/21 0834  Intake/Output: I/O last 3 completed shifts: In: 352 [P.O.:240; IV Piggyback:112] Out: 4 [Urine:2; Stool:2]  Intake/Output this shift:  No intake/output data recorded. Weight change: -9.3 kg Gen: NAD CVS: RRR Resp:CTA Abd: +BS, soft, NT/ND Ext: 1+ edema BLE, L>R Access: RIJ TC  Recent Labs  Lab 10/16/21 0811 10/17/21 0149 10/18/21 0100 10/18/21 0937 10/19/21 0404 10/20/21 0049 10/21/21 0052  NA 137 141 139 138 138 137 139  K 4.0 4.1 4.0 4.2 3.7 3.3* 3.6  CL 104 102 102 103 103 101 103  CO2 27 26 28 26 28 28 30   GLUCOSE 107* 142* 191* 106* 85 104* 108*  BUN 38* 40* 23* 23* 24* 26* 12  CREATININE 3.97* 4.21* 3.12* 3.14* 3.53* 3.83* 2.56*  ALBUMIN 2.3* 2.2* 2.1* 2.3* 2.1* 2.1* 2.2*  CALCIUM 8.0* 8.5* 8.2* 8.6* 8.4* 8.4* 8.1*  PHOS 3.3 3.5 2.6 2.9 3.1 3.4 2.1*   Liver Function Tests: Recent Labs  Lab 10/19/21 0404 10/20/21 0049 10/21/21 0052  ALBUMIN 2.1* 2.1* 2.2*   No results for  input(s): "LIPASE", "AMYLASE" in the last 168 hours. No results for input(s): "AMMONIA" in the last 168 hours. CBC: Recent Labs  Lab 10/18/21 0100 10/18/21 0937 10/19/21 0404 10/20/21 0049 10/21/21 0052  WBC 18.7* 15.5* 16.1* 14.7* 16.6*  NEUTROABS 14.2*  --  12.3* 11.0*  --   HGB 9.4* 10.0* 9.5* 9.1* 9.4*  HCT 29.8* 31.5* 30.0* 28.9* 29.8*  MCV 85.1 85.4 86.5 87.6 86.9  PLT 98* 101* 107* 116* 140*   Cardiac Enzymes: No results for input(s): "CKTOTAL", "CKMB", "CKMBINDEX", "TROPONINI" in the last 168  hours. CBG: Recent Labs  Lab 10/20/21 0836 10/20/21 1430 10/20/21 1703 10/20/21 2236 10/21/21 0826  GLUCAP 83 107* 167* 113* 97    Iron Studies: No results for input(s): "IRON", "TIBC", "TRANSFERRIN", "FERRITIN" in the last 72 hours. Studies/Results: IR Fluoro Guide CV Line Right  Result Date: 10/19/2021 INDICATION: Need for long-term dialysis EXAM: Conversion of existing central venous catheter for a tunneled hemodialysis catheter using fluoroscopic guidance MEDICATIONS: Per EMR ANESTHESIA/SEDATION: Moderate (conscious) sedation was employed during this procedure. A total of Versed 1 mg and Fentanyl 50 mcg was administered intravenously. Moderate Sedation Time: 23 minutes. The patient's level of consciousness and vital signs were monitored continuously by radiology nursing throughout the procedure under my direct supervision. FLUOROSCOPY TIME:  Fluoroscopy Time: 0.5 minutes (5 mGy) COMPLICATIONS: None immediate. PROCEDURE: Informed written consent was obtained from the patient after a thorough discussion of the procedural risks, benefits and alternatives. All questions were addressed. Maximal Sterile Barrier Technique was utilized including caps, mask, sterile gowns, sterile gloves, sterile drape, hand hygiene and skin antiseptic. A timeout was performed prior to the initiation of the procedure. The patient was placed supine on the exam table. The right neck and chest was prepped and draped in the standard sterile fashion with inclusion of the existing temporary dialysis catheter the sterile field. Over an 035 wire, the existing dialysis catheter was removed for a 14 [place] dilator. Attention was then turned to an infraclavicular location, where a small dermatotomy was made after the administration of additional local anesthetic. From this location, a 19 cm tip-to-cuff hemodialysis catheter was tunneled to the venotomy site. A peel-away sheath was then advanced over the access wire. Through this  peel-away sheath, the hemodialysis catheter was advanced into the central veins, such that the tip was positioned near the superior cavoatrial junction. The line was found to flush and aspirate appropriately. It was sutured to the skin using 0 silk suture, and the venotomy was closed with Dermabond. A sterile dressing was placed. The patient tolerated the procedure well without immediate complication. IMPRESSION: Successful conversion of existing central venous catheter for a new tunneled hemodialysis catheter via the right internal jugular vein. The line is ready for immediate use. Electronically Signed   By: Albin Felling M.D.   On: 10/19/2021 11:16    sodium chloride   Intravenous Once   acetaminophen  650 mg Oral Q6H   acetaminophen  650 mg Oral Once   calcium acetate  667 mg Oral TID WC   Chlorhexidine Gluconate Cloth  6 each Topical Daily   Chlorhexidine Gluconate Cloth  6 each Topical Q0600   collagenase   Topical Daily   darbepoetin (ARANESP) injection - NON-DIALYSIS  60 mcg Subcutaneous Q Wed-1800   diphenhydrAMINE  25 mg Oral Once   doxycycline  100 mg Oral Q12H   feeding supplement (NEPRO CARB STEADY)  237 mL Oral BID BM   heparin  5,000 Units Subcutaneous Q8H  insulin aspart  0-6 Units Subcutaneous TID WC   magnesium oxide  400 mg Oral Daily   multivitamin  1 tablet Oral QHS   rosuvastatin  10 mg Oral Daily   sodium bicarbonate  1,300 mg Oral TID   tamsulosin  0.4 mg Oral QPC supper    BMET    Component Value Date/Time   NA 139 10/21/2021 0052   NA 132 (L) 09/24/2021 1453   K 3.6 10/21/2021 0052   CL 103 10/21/2021 0052   CO2 30 10/21/2021 0052   GLUCOSE 108 (H) 10/21/2021 0052   BUN 12 10/21/2021 0052   BUN 36 (H) 09/24/2021 1453   CREATININE 2.56 (H) 10/21/2021 0052   CREATININE 0.98 08/29/2019 1637   CALCIUM 8.1 (L) 10/21/2021 0052   GFRNONAA 26 (L) 10/21/2021 0052   GFRAA >60 10/27/2019 2021   CBC    Component Value Date/Time   WBC 16.6 (H) 10/21/2021 0052    RBC 3.43 (L) 10/21/2021 0052   HGB 9.4 (L) 10/21/2021 0052   HGB 9.5 (L) 09/24/2021 1453   HCT 29.8 (L) 10/21/2021 0052   HCT 28.1 (L) 09/24/2021 1453   PLT 140 (L) 10/21/2021 0052   PLT 248 09/24/2021 1453   MCV 86.9 10/21/2021 0052   MCV 83 09/24/2021 1453   MCH 27.4 10/21/2021 0052   MCHC 31.5 10/21/2021 0052   RDW 19.5 (H) 10/21/2021 0052   RDW 14.3 09/24/2021 1453   LYMPHSABS 2.0 10/20/2021 0049   MONOABS 0.8 10/20/2021 0049   EOSABS 0.6 (H) 10/20/2021 0049   BASOSABS 0.2 (H) 10/20/2021 0049

## 2021-10-21 NOTE — Progress Notes (Signed)
Inpatient Rehabilitation Admissions Coordinator   We will sign off. Home Health recommended.  Danne Baxter, RN, MSN Rehab Admissions Coordinator [phone] 10/21/2021 8:15 AM

## 2021-10-21 NOTE — Progress Notes (Signed)
PROGRESS NOTE    Lindsey Murillo  UXN:[id number] DOB: 06/25/1995 DOA: 10/01/2021 PCP: Coral Spikes, DO    Chief Complaint  Patient presents with   Leg Swelling    X 2 weeks     Brief Narrative:  Lindsey Murillo is a 26 y.o. female with past medical history significant for is a 26 year old female with past medical history significant for type 1 diabetes mellitus, stage IV CKD with nephrotic syndrome, anemia of chronic medical/renal disease, diabetic neuropathy, HTN who initially presented to [facility] ED on 5/25 with complaints of not feeling well, lower extremity edema, and posterior neck pain with associated drainage.  Mother at bedside assists with HPI given patient's resistance/evasive to questions.  Her mother reports that patient has not been feeling well for the last 2 weeks and noted a area of the posterior left neck that started draining 2 days prior.  Patient/mother denied any trauma to that region.  Pain is worse with any type of range of motion of the neck, nothing seems to make it better.  Mother put some ointment on it which offered her no relief.   In the ED, temperature 98.2 F, HR 102, RR 17, BP 96/67, SPO2 100% on room air.  Sodium 130, potassium 2.7, chloride 107, CO2 14, glucose 179, BUN 66, creatinine 4.76.  BNP 626.0.  WBC 38.9, hemoglobin 8.6, platelet count 330.  Lactic acid 0.7, procalcitonin 1.85.  INR 1.2.  Urinalysis with trace leukocytes, negative nitrite, rare bacteria, 11-20 WBCs.  Pregnancy test negative.  Chest x-ray with cardiomegaly, no active cardiopulmonary disease process.  CT soft tissue neck with area of phlegmon/abscess measuring 4 x 2.4 x 4.8 cm localized upper left neck with enlarged left upper cervical chain lymph nodes.  Blood cultures x2 were drawn.  Patient was started on vancomycin, cefepime, Flagyl.  Initially admitted to [facility].  Valuated by general surgery, Dr. Arnoldo Morale and requested transfer to a higher level of care due to  multiple comorbidities.  Case was discussed with ENT, Dr. Benjamine Mola; and location was out of scope of ENT and recommended transfer to [facility] for general surgery evaluation.  Patient was transferred to [facility] and admitted to the hospital service with general surgery in consultation.   She's now s/p I&D of L side of neck.  Surgery signed off, plan for 10-14 days abx.  R breast without evidence of abscess, plan for outpatient follow up, 10 days doxycycline.  Renal following for aki on CKD, tunneled HD cath placed today.      Assessment & Plan:  Principal Problem:   Sepsis ([place]) Active Problems:   Cellulitis of right breast   Uncontrolled type 1 diabetes mellitus with hyperglycemia ([name])   Pyuria   AKI (acute kidney injury) ([place])   Nephrotic syndrome   Hyponatremia   Peripheral edema   Hypokalemia   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 chronic kidney disease ([name])   Protein-calorie malnutrition, severe ([name])   Physical deconditioning   Hypomagnesemia   Broken tooth   Neck abscess   CKD (chronic kidney disease) stage 4, GFR 15-29 ml/min ([name])    Assessment and Plan: * Sepsis ([place]) Ruled in for sepsis at admission Due to L neck abscess CT with 4x2.4x4.8 cm phlegmon (limited evaluation due to absence of contrast), enlarged L upper chain cervical chain LN S/p I&D 5/28, per surgery -> wet to dry dressing changes BID, santyl to wound base Concern for R breast infection as well, no definite or  focal fluid collection noted on [place] MSSA on 5/28 culture Blood cultures ngtd Vancomycin 5/25 Cefepime 5/25-5/28 linezolid 5/27 - 5/30 Ancef 5/30 - present Narrowed to ancef, continue for now (will plan for at least 14 days, then reeval) Surgery recommending 10 days abx (doxycycline) for R breast. -Discontinue Ancef after today's doses. Leukocytosis was trending back up however trending back down.  Patient afebrile.   -Chest x-ray negative for any acute infiltrate.   -UA pending.   -Blood cultures with no  growth to date. -Repeat labs in the AM.  Cellulitis of right breast US without definite or focal fluid collection Needs outpatient follow up, this has been arranged by surgery -> follow up with surgery and at breast clinic Surgery recommended 10 days doxy  Pyuria UA with bacteria and yeast - no clear symptoms, suspect contaminant Follow culture only if symptomatic  Uncontrolled type 1 diabetes mellitus with hyperglycemia ([place]) Uncontrolled type I diabetic with a last hemoglobin A1c of 9.8 (09/24/2021). Basal insulin appears it was d/c'd due to hypoglycemia?  Hold cautiously for now (she was just on D5 IVF and still wasn't requiring much insulin) -CBG noted at 97 this morning.  -SSI.   Hyponatremia Overloaded - improved with diuresis and HD. Follow with diuresis  Nephrotic syndrome - Was on albumin and IV Lasix.   -Albumin completed.   -On IV Lasix and HD.   -Per nephrology.  AKI (acute kidney injury) ([place]) Creatinine 3.83 today - relatively stable with diuresis  Baseline ~3 Renal US negative for hydronephrosis.  Appearred overloaded early on in the hospitalization, LE edema appears to be improving gradually with diuresis and HD. Renal consulted, appreciate assistance - temp HD cath placed  Diuresis with albumin given edema/nephrotic syndrome -Patient also underwent hemodialysis on 10/14/2021, 10/15/2021 per nephrology. 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 -Per nephrology will eventually need TEE DC and AVF/AVG placement once infection under control. -Patient assessed by interventional radiology s/p placement of dialysis catheter Monday, 10/19/2021. -Will need outpatient HD arrangements prior to discharge. -Per nephrology.  Peripheral edema Diuresis with albumin for nephrotic syndrome Per nephrology.  Hypokalemia -On hemodialysis. -Per nephrology.   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 chronic kidney disease ([name]) Follow Elevated ferritin, low iron -  normal folate, elevated b12 S/p 1 unit pRBC -Hemoglobin at 9.4 from 9.1 from 6.9 (10/17/2021 ) after transfusion of 2 units packed red blood cells during HD on 10/17/2021.   -Patient with no overt bleeding. -Transfusion threshold hemoglobin < 7.  Protein-calorie malnutrition, severe ([place]) - Continue nutritional supplementation.  Physical deconditioning PT  Hypomagnesemia Repleted.  Magnesium noted at 1.9   Broken tooth Upper right Pain related to this Continue abx for now  Needs dental f/u outpatient         DVT prophylaxis: Heparin Code Status: Full Family Communication: Updated patient, mother at bedside.   Disposition: TBD  Status is: Inpatient Remains inpatient appropriate because: Severity of illness   Consultants:  General surgery: Dr. Donne Hazel 10/03/2021 Nephrology: Dr. Candiss Norse 10/07/2021 Wound care RN  Procedures:  CT soft tissue neck 10/02/2021 Chest x-ray 10/01/2021, 10/18/2021 Renal ultrasound 10/07/2021 Ultrasound of left breast 10/04/2021 Ultrasound of the head and neck 10/02/2021 Irrigation and debridement neck abscess by Dr. Rosendo Gros 10/04/2021/general surgery. Transfusion 2 units packed red blood cells 10/17/2021 during HD. Transfusion 1 unit packed red blood cells 10/11/2021. Conversion to temp HD line to tunneled HD line per IR, Dr.El-Abd 10/19/2021.    Antimicrobials:  Anti-infectives (From admission, onward)  Start     Dose/Rate Route Frequency Ordered Stop   10/19/21 0600  ceFAZolin (ANCEF) IVPB 2g/100 mL premix  Status:  Discontinued        2 g 200 mL/hr over 30 Minutes Intravenous To Radiology 10/16/21 1125 10/18/21 1250   10/19/21 0600  ceFAZolin (ANCEF) IVPB 1 g/50 mL premix        1 g 100 mL/hr over 30 Minutes Intravenous Every 24 hours 10/18/21 1249     10/13/21 2200  doxycycline (VIBRA-TABS) tablet 100 mg        100 mg Oral Every 12 hours 10/13/21 1719 10/23/21 2159   10/06/21 2200  ceFAZolin (ANCEF) IVPB 1 g/50 mL premix  Status:   Discontinued        1 g 100 mL/hr over 30 Minutes Intravenous Every 12 hours 10/06/21 1052 10/18/21 1249   10/04/21 2200  ceFEPIme (MAXIPIME) 1 g in sodium chloride 0.9 % 100 mL IVPB  Status:  Discontinued        1 g 200 mL/hr over 30 Minutes Intravenous Every 24 hours 10/04/21 1011 10/05/21 1343   10/03/21 2200  vancomycin (VANCOREADY) IVPB 750 mg/150 mL  Status:  Discontinued        750 mg 150 mL/hr over 60 Minutes Intravenous Every 48 hours 10/02/21 0033 10/02/21 1144   10/03/21 1000  linezolid (ZYVOX) IVPB 600 mg  Status:  Discontinued        600 mg 300 mL/hr over 60 Minutes Intravenous Every 12 hours 10/02/21 1144 10/06/21 1052   10/02/21 2200  ceFEPIme (MAXIPIME) 2 g in sodium chloride 0.9 % 100 mL IVPB  Status:  Discontinued        2 g 200 mL/hr over 30 Minutes Intravenous Every 24 hours 10/02/21 0033 10/04/21 1011   10/02/21 0000  ceFEPIme (MAXIPIME) 2 g in sodium chloride 0.9 % 100 mL IVPB        2 g 200 mL/hr over 30 Minutes Intravenous  Once 10/01/21 2358 10/02/21 0246   10/02/21 0000  metroNIDAZOLE (FLAGYL) IVPB 500 mg        500 mg 100 mL/hr over 60 Minutes Intravenous  Once 10/01/21 2358 10/02/21 0246   10/02/21 0000  vancomycin (VANCOCIN) IVPB 1000 mg/200 mL premix        1,000 mg 200 mL/hr over 60 Minutes Intravenous  Once 10/01/21 2358 10/02/21 0244         Subjective: Patient sitting up in room.  No chest pain.  No shortness of breath.  No abdominal pain.  Overall feeling better.  Mother at bedside.  Objective: Vitals:   10/20/21 1702 10/20/21 2015 10/21/21 0453 10/21/21 0824  BP: (!) 141/93 (!) 137/96 (!) 155/98 (!) 157/101  Pulse: 96 92 91 96  Resp: 16 18 18 15   Temp: 98.4 F (36.9 C) 98.4 F (36.9 C)  (!) 97.4 F (36.3 C)  TempSrc: Oral Oral  Oral  SpO2: 98% 100% 97% 96%  Weight:      Height:       No intake or output data in the 24 hours ending 10/21/21 1601   Filed Weights   10/20/21 0345 10/20/21 0804 10/20/21 1217  Weight: 66.7 kg 57.4 kg  55.6 kg    Examination:  General exam: NAD.  Left neck wound stable.  Newly placed HD catheter right upper chest area. Respiratory system: Clear to auscultation bilaterally anterior lung fields.  No wheezes, no crackles, no rhonchi.  Fair air movement. Cardiovascular system: Regular rate and rhythm  no murmurs rubs or gallops.  No JVD.  Trace to 1+ bilateral lower extremity edema.  Left lower extremity slightly bigger than right lower extremit Gastrointestinal system: Abdomen is soft, nontender, nondistended, positive bowel sounds.  No rebound.  No guarding. Central nervous system: Alert and oriented. No focal neurological deficits. Extremities: Trace to 1+ bilateral lower extremity edema.  Symmetric 5 x 5 power. Skin: No rashes, lesions or ulcers Psychiatry: Judgement and insight appear normal. Mood & affect appropriate.     Data Reviewed:   CBC: Recent Labs  Lab 10/15/21 0156 10/16/21 0811 10/18/21 0100 10/18/21 0937 10/19/21 0404 10/20/21 0049 10/21/21 0052  WBC 14.7*   < > 18.7* 15.5* 16.1* 14.7* 16.6*  NEUTROABS 11.9*  --  14.2*  --  12.3* 11.0*  --   HGB 7.2*   < > 9.4* 10.0* 9.5* 9.1* 9.4*  HCT 22.4*   < > 29.8* 31.5* 30.0* 28.9* 29.8*  MCV 87.5   < > 85.1 85.4 86.5 87.6 86.9  PLT 85*   < > 98* 101* 107* 116* 140*   < > = values in this interval not displayed.    Basic Metabolic Panel: Recent Labs  Lab 10/15/21 0156 10/16/21 0811 10/18/21 0100 10/18/21 0937 10/19/21 0404 10/20/21 0049 10/21/21 0052  NA 134*   < > 139 138 138 137 139  K 3.7   < > 4.0 4.2 3.7 3.3* 3.6  CL 101   < > 102 103 103 101 103  CO2 24   < > 28 26 28 28 30   GLUCOSE 123*   < > 191* 106* 85 104* 108*  BUN 50*   < > 23* 23* 24* 26* 12  CREATININE 4.85*   < > 3.12* 3.14* 3.53* 3.83* 2.56*  CALCIUM 7.8*   < > 8.2* 8.6* 8.4* 8.4* 8.1*  MG 1.9  --   --   --   --   --   --   PHOS 3.7   < > 2.6 2.9 3.1 3.4 2.1*   < > = values in this interval not displayed.    GFR: Estimated Creatinine  Clearance: 29 mL/min (A) (by C-G formula based on SCr of 2.56 mg/dL (H)).  Liver Function Tests: Recent Labs  Lab 10/18/21 0100 10/18/21 0937 10/19/21 0404 10/20/21 0049 10/21/21 0052  ALBUMIN 2.1* 2.3* 2.1* 2.1* 2.2*    CBG: Recent Labs  Lab 10/20/21 1430 10/20/21 1703 10/20/21 2236 10/21/21 0826 10/21/21 1224  GLUCAP 107* 167* 113* 97 104*     Recent Results (from the past 240 hour(s))  Culture, blood (Routine X 2) w Reflex to ID Panel     Status: None (Preliminary result)   Collection Time: 10/18/21  9:37 AM   Specimen: BLOOD  Result Value Ref Range Status   Specimen Description BLOOD LEFT ANTECUBITAL  Final   Special Requests   Final    BOTTLES DRAWN AEROBIC AND ANAEROBIC Blood Culture adequate volume   Culture   Final    NO GROWTH 3 DAYS Performed at [facility], [address], [place], [place] [address]    Report Status PENDING  Incomplete  Culture, blood (Routine X 2) w Reflex to ID Panel     Status: None (Preliminary result)   Collection Time: 10/18/21  9:37 AM   Specimen: BLOOD RIGHT HAND  Result Value Ref Range Status   Specimen Description BLOOD RIGHT HAND  Final   Special Requests   Final    BOTTLES DRAWN  AEROBIC AND ANAEROBIC Blood Culture adequate volume   Culture   Final    NO GROWTH 3 DAYS Performed at [facility], [place] [address], [place], [place] [address]    Report Status PENDING  Incomplete         Radiology Studies: No results found.      Scheduled Meds:  sodium chloride   Intravenous Once   acetaminophen  650 mg Oral Q6H   acetaminophen  650 mg Oral Once   Chlorhexidine Gluconate Cloth  6 each Topical Daily   Chlorhexidine Gluconate Cloth  6 each Topical Q0600   collagenase   Topical Daily   [START ON 10/27/2021] darbepoetin (ARANESP) injection - DIALYSIS  60 mcg Intravenous Q Tue-HD   diphenhydrAMINE  25 mg Oral Once   doxycycline  100 mg Oral Q12H   feeding supplement (NEPRO CARB STEADY)  237 mL Oral BID BM    heparin  5,000 Units Subcutaneous Q8H   insulin aspart  0-6 Units Subcutaneous TID WC   multivitamin  1 tablet Oral QHS   rosuvastatin  10 mg Oral Daily   tamsulosin  0.4 mg Oral QPC supper   Continuous Infusions:   ceFAZolin (ANCEF) IV 1 g (10/20/21 1716)     LOS: 19 days    Time spent: 35 minutes    Irine Seal, MD [facility]   To contact the attending provider between 7A-7P or the covering provider during after hours 7P-7A, please log into the web site [email or web address] and access using universal [place] password for that web site. If you do not have the password, please call the hospital operator.  10/21/2021, 4:01 PM

## 2021-10-21 NOTE — Progress Notes (Signed)
Ok to stop cefazolin per Dr. Grandville Silos.  Onnie Boer, PharmD, BCIDP, AAHIVP, CPP Infectious Disease Pharmacist 10/21/2021 4:17 PM

## 2021-10-21 NOTE — Progress Notes (Addendum)
Contacted [facility] admissions requesting update on pt's case. Awaiting response.   Melven Sartorius Renal Navigator [phone]   Addendum at 1:07 pm: Pt has been accepted at [facility] on MWF 11:15 chair time. Pt can start on Friday and will need to arrive at 10:30 to complete paperwork prior to treatment. Met with pt at bedside to discuss above details. Pt provided schedule letter and agreeable to plan. Update provided to attending, nephrologist, RN, and RN CM. Will add out-pt HD arrangements to pt's AVS as well.

## 2021-10-22 ENCOUNTER — Encounter (HOSPITAL_COMMUNITY): Payer: Self-pay | Admitting: Hematology

## 2021-10-22 ENCOUNTER — Other Ambulatory Visit (HOSPITAL_COMMUNITY): Payer: Self-pay

## 2021-10-22 DIAGNOSIS — N184 Chronic kidney disease, stage 4 (severe): Secondary | ICD-10-CM | POA: Diagnosis not present

## 2021-10-22 DIAGNOSIS — N61 Mastitis without abscess: Secondary | ICD-10-CM | POA: Diagnosis not present

## 2021-10-22 DIAGNOSIS — N179 Acute kidney failure, unspecified: Secondary | ICD-10-CM | POA: Diagnosis not present

## 2021-10-22 DIAGNOSIS — A4101 Sepsis due to Methicillin susceptible Staphylococcus aureus: Secondary | ICD-10-CM | POA: Diagnosis not present

## 2021-10-22 DIAGNOSIS — R8281 Pyuria: Secondary | ICD-10-CM

## 2021-10-22 LAB — CBC
HCT: 31.2 % — ABNORMAL LOW (ref 36.0–46.0)
Hemoglobin: 9.5 g/dL — ABNORMAL LOW (ref 12.0–15.0)
MCH: 27 pg (ref 26.0–34.0)
MCHC: 30.4 g/dL (ref 30.0–36.0)
MCV: 88.6 fL (ref 80.0–100.0)
Platelets: 154 10*3/uL (ref 150–400)
RBC: 3.52 MIL/uL — ABNORMAL LOW (ref 3.87–5.11)
RDW: 19.6 % — ABNORMAL HIGH (ref 11.5–15.5)
WBC: 13.6 10*3/uL — ABNORMAL HIGH (ref 4.0–10.5)
nRBC: 0 % (ref 0.0–0.2)

## 2021-10-22 LAB — GLUCOSE, CAPILLARY
Glucose-Capillary: 81 mg/dL (ref 70–99)
Glucose-Capillary: 139 mg/dL — ABNORMAL HIGH (ref 70–99)

## 2021-10-22 LAB — RENAL FUNCTION PANEL
Albumin: 2.2 g/dL — ABNORMAL LOW (ref 3.5–5.0)
Anion gap: 10 (ref 5–15)
BUN: 14 mg/dL (ref 6–20)
CO2: 28 mmol/L (ref 22–32)
Calcium: 8.5 mg/dL — ABNORMAL LOW (ref 8.9–10.3)
Chloride: 101 mmol/L (ref 98–111)
Creatinine, Ser: 2.95 mg/dL — ABNORMAL HIGH (ref 0.44–1.00)
GFR, Estimated: 22 mL/min — ABNORMAL LOW (ref >60)
Glucose, Bld: 128 mg/dL — ABNORMAL HIGH (ref 70–99)
Phosphorus: 3 mg/dL (ref 2.5–4.6)
Potassium: 3.7 mmol/L (ref 3.5–5.1)
Sodium: 139 mmol/L (ref 135–145)

## 2021-10-22 MED ORDER — OXYCODONE HCL 5 MG PO TABS
5.0000 mg | ORAL_TABLET | Freq: Four times a day (QID) | ORAL | 0 refills | Status: DC | PRN
  Filled 2021-10-22: qty 15, 4d supply, fill #0

## 2021-10-22 MED ORDER — TAMSULOSIN HCL 0.4 MG PO CAPS
0.4000 mg | ORAL_CAPSULE | Freq: Every day | ORAL | 1 refills | Status: DC
  Filled 2021-10-22: qty 30, 30d supply, fill #0

## 2021-10-22 MED ORDER — HEPARIN SODIUM (PORCINE) 1000 UNIT/ML IJ SOLN
INTRAMUSCULAR | Status: AC
  Filled 2021-10-22: qty 4

## 2021-10-22 MED ORDER — COLLAGENASE 250 UNIT/GM EX OINT: TOPICAL_OINTMENT | Freq: Every day | CUTANEOUS | 0 refills | Status: DC

## 2021-10-22 MED ORDER — INSULIN GLARGINE-YFGN 100 UNIT/ML ~~LOC~~ SOPN
5.0000 [IU] | PEN_INJECTOR | Freq: Every day | SUBCUTANEOUS | 0 refills | Status: DC
Start: 2021-10-22 — End: 2022-01-19
  Filled 2021-10-22: qty 3, 60d supply, fill #0

## 2021-10-22 MED ORDER — COLLAGENASE 250 UNIT/GM EX OINT
TOPICAL_OINTMENT | Freq: Every day | CUTANEOUS | 0 refills | Status: DC
  Filled 2021-10-22: qty 30, fill #0

## 2021-10-22 MED ORDER — TAMSULOSIN HCL 0.4 MG PO CAPS: 0.4000 mg | ORAL_CAPSULE | Freq: Every day | ORAL | 1 refills | Status: DC

## 2021-10-22 MED ORDER — INSULIN PEN NEEDLE 31G X 4 MM MISC: 1 refills | Status: DC

## 2021-10-22 MED ORDER — GLIPIZIDE 5 MG PO TABS
5.0000 mg | ORAL_TABLET | Freq: Two times a day (BID) | ORAL | 1 refills | Status: DC
  Filled 2021-10-22: qty 60, 30d supply, fill #0

## 2021-10-22 MED ORDER — GLIPIZIDE 5 MG PO TABS
5.0000 mg | ORAL_TABLET | Freq: Two times a day (BID) | ORAL | 1 refills | Status: DC
Start: 2021-10-22 — End: 2021-10-22

## 2021-10-22 MED ORDER — OXYCODONE HCL 5 MG PO TABS
5.0000 mg | ORAL_TABLET | Freq: Four times a day (QID) | ORAL | 0 refills | Status: DC | PRN
Start: 2021-10-22 — End: 2021-10-22

## 2021-10-22 NOTE — Plan of Care (Signed)
Patient ID: Lindsey Murillo, female   DOB: November 05, 1995, 26 y.o.   MRN: [id number]  Problem: Education: Goal: Knowledge of General Education information will improve Description: Including pain rating scale, medication(s)/side effects and non-pharmacologic comfort measures Outcome: Adequate for Discharge   Problem: Health Behavior/Discharge Planning: Goal: Ability to manage health-related needs will improve Outcome: Adequate for Discharge   Problem: Clinical Measurements: Goal: Ability to maintain clinical measurements within normal limits will improve Outcome: Adequate for Discharge Goal: Will remain free from infection Outcome: Adequate for Discharge Goal: Diagnostic test results will improve Outcome: Adequate for Discharge   Problem: Activity: Goal: Risk for activity intolerance will decrease Outcome: Adequate for Discharge   Problem: Nutrition: Goal: Adequate nutrition will be maintained Outcome: Adequate for Discharge   Problem: Coping: Goal: Level of anxiety will decrease Outcome: Adequate for Discharge   Problem: Elimination: Goal: Will not experience complications related to bowel motility Outcome: Adequate for Discharge Goal: Will not experience complications related to urinary retention Outcome: Adequate for Discharge   Problem: Pain Managment: Goal: General experience of comfort will improve Outcome: Adequate for Discharge   Problem: Safety: Goal: Ability to remain free from injury will improve Outcome: Adequate for Discharge   Problem: Skin Integrity: Goal: Risk for impaired skin integrity will decrease Outcome: Adequate for Discharge   Problem: Acute Rehab PT Goals(only PT should resolve) Goal: Pt Will Go Supine/Side To Sit Outcome: Adequate for Discharge Goal: Patient Will Transfer Sit To/From Stand Outcome: Adequate for Discharge Goal: Pt Will Ambulate Outcome: Adequate for Discharge Goal: Pt Will Go Up/Down Stairs Outcome: Adequate for  Discharge   Problem: Increased Nutrient Needs (NI-5.1) Goal: Food and/or nutrient delivery Description: Individualized approach for food/nutrient provision. Outcome: Adequate for Discharge   Problem: Acute Rehab OT Goals (only OT should resolve) Goal: Pt. Will Perform Grooming Outcome: Adequate for Discharge Goal: Pt. Will Perform Upper Body Bathing Outcome: Adequate for Discharge Goal: Pt. Will Transfer To Toilet Outcome: Adequate for Discharge Goal: OT Additional ADL Goal #1 Outcome: Adequate for Discharge Goal: OT Additional ADL Goal #2 Outcome: Adequate for Discharge   Problem: Education: Goal: Ability to describe self-care measures that may prevent or decrease complications (Diabetes Survival Skills Education) will improve Outcome: Adequate for Discharge Goal: Individualized Educational Video(s) Outcome: Adequate for Discharge   Problem: Coping: Goal: Ability to adjust to condition or change in health will improve Outcome: Adequate for Discharge   Problem: Fluid Volume: Goal: Ability to maintain a balanced intake and output will improve Outcome: Adequate for Discharge   Problem: Health Behavior/Discharge Planning: Goal: Ability to identify and utilize available resources and services will improve Outcome: Adequate for Discharge Goal: Ability to manage health-related needs will improve Outcome: Adequate for Discharge   Problem: Metabolic: Goal: Ability to maintain appropriate glucose levels will improve Outcome: Adequate for Discharge   Problem: Nutritional: Goal: Maintenance of adequate nutrition will improve Outcome: Adequate for Discharge Goal: Progress toward achieving an optimal weight will improve Outcome: Adequate for Discharge   Problem: Skin Integrity: Goal: Risk for impaired skin integrity will decrease Outcome: Adequate for Discharge   Problem: Tissue Perfusion: Goal: Adequacy of tissue perfusion will improve Outcome: Adequate for Discharge    Haydee Salter, RN

## 2021-10-22 NOTE — Progress Notes (Signed)
Pt's case discussed with attending and nephrologist. Plan is for pt to d/c this afternoon and start at HD clinic tomorrow. Contacted [facility] and spoke to [place]. Clinic advised pt will d/c today and start tomorrow. Renal PA has sent orders to clinic. Penny at clinic confirms orders have been received. Spoke to pt via phone. Pt aware that she needs to start at out-pt clinic tomorrow and needs to arrive at 10:30.   Melven Sartorius Renal Navigator [phone]

## 2021-10-22 NOTE — Discharge Summary (Addendum)
Physician Discharge Summary  Lindsey Murillo IWL:[id number] DOB: 09-15-1995 DOA: 10/01/2021  PCP: Coral Spikes, DO  Admit date: 10/01/2021 Discharge date: 10/22/2021  Time spent: 60 minutes  Recommendations for Outpatient Follow-up:  Follow-up with Dr. Donne Hazel, general surgery 11/05/2021 at 10:20 AM. Follow-up with Coral Spikes, DO in 2 weeks.  On follow-up patient's diabetes will need to be reassessed as patient's long-acting insulin was decreased to 5 units daily.  Patient also need a basic metabolic profile done to follow-up on electrolytes and renal function.  Patient will need a CBC done to follow-up on counts. Follow-up at the hemodialysis center on 10/23/2021 at 10:30 AM. Patient will be discharged home with home health therapies.   Discharge Diagnoses:  Principal Problem:   Sepsis ([place]) Active Problems:   Cellulitis of right breast   Uncontrolled type 1 diabetes mellitus with hyperglycemia ([name])   Pyuria   AKI (acute kidney injury) ([place])   Nephrotic syndrome   Hyponatremia   Peripheral edema   Hypokalemia   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 chronic kidney disease ([name])   Protein-calorie malnutrition, severe ([name])   Physical deconditioning   Hypomagnesemia   Broken tooth   Neck abscess   CKD (chronic kidney disease) stage 4, GFR 15-29 ml/min ([place])   Discharge Condition: Stable and improved  Diet recommendation: Heart healthy  Filed Weights   10/22/21 0500 10/22/21 0816 10/22/21 1245  Weight: 59.6 kg 54.9 kg 57.8 kg    History of present illness:  HPI per Dr. Josph Macho- Joline Salt is a 26 y.o. female with medical history significant of with history of anemia and stage IV kidney disease, diabetic neuropathy, type 1 diabetes, goiter, hypertension, and more presents the ED with chief complaint of not feeling well.  Patient is resistant to answer questions and generally has an exasperated attitude towards healthcare provider both in the ED and during my exam.  Mother  is at bedside and answers most of her questions.  Mother reports that patient is not been feeling well for 2 weeks.  Its not been progressively worse but constant since it started.  Mother suspects it is related to her injection of erythropoietin last week.  She reports that patient had a cough that was nonproductive, but the cough is mostly resolved now.  Patient has been chilled but has not had a measurable fever.  Patient has been fatigued, rundown, nauseous but has not vomited.  Patient has chronic diarrhea with 2-3 loose stools daily.  No blood in the stool.  No hematuria, no dysuria.  Patient makes very little urine due to her kidney disease.  Patient complains of chest pain that she describes as tightness.  It was worse when she was coughing.  Is also worse with ambulation, and associated with shortness of breath.  She reports the chest pain has been present since her shot of erythropoietin.  Patient complains of chronic stomach pain-not new for her.  She is not sure the location of the pain.  She describes it as a sharp pain that last most the day.  Having a bowel movement or eating makes it worse.  Nothing makes it better.  When he goes away it goes away on its own.  At first, patient was refusing physical exam and source could not be identified for her infection.  She was tachycardic, borderline hypotensive, and had a massive leukocytosis of 38.9.  She did have a shot of erythropoietin, but this should not result in a leukocytosis of 38.9.  Procalcitonin was done at 1.85 making sepsis more likely.  Finally when she did allow an exam she has a draining abscess on the back of her neck.  This is likely the source.  Urine is still pending.  Mother and patient report that they just noticed this abscess when it started draining 2 days ago.  They did not notice the swelling beforehand.  Very painful.  Patient reports it is worse when she tries to move her neck.  Nothing makes it better.  Mother put ointment on  it, which offered her no relief per her report.   Patient does not smoke, does not drink alcohol, does not use illicit drugs.  Patient is vaccinated for COVID.  Patient is full code.   Patient has no other complaints at this time. Hospital Course:   Assessment and Plan: * Sepsis ([place]) Ruled in for sepsis at admission Due to L neck abscess CT with 4x2.4x4.8 cm phlegmon (limited evaluation due to absence of contrast), enlarged L upper chain cervical chain LN S/p I&D 5/28, per surgery -> wet to dry dressing changes BID, santyl to wound base Concern for R breast infection as well, no definite or focal fluid collection noted on [place] MSSA on 5/28 culture Blood cultures ngtd Vancomycin 5/25 Cefepime 5/25-5/28 linezolid 5/27 - 5/30 Ancef 5/30 -10/21/2021 Narrowed to ancef, and completed a 14-day course of antibiotic treatment.  Surgery recommended 10 days abx (doxycycline) for R breast which was completed. -Leukocytosis fluctuating and subsequently trended down. -Patient remained afebrile. -Chest x-ray negative for any acute infiltrate.   -Blood cultures with no growth to date. -Patient was discharged in stable and improved condition. -Outpatient follow-up with general surgery/breast clinic.  Cellulitis of right breast US without definite or focal fluid collection Needs outpatient follow up, this has been arranged by surgery -> follow up with surgery and at breast clinic Surgery recommended 10 days doxy which patient completed during the hospitalization.  Pyuria UA with bacteria and yeast - no clear symptoms, suspect contaminant. -Patient remained asymptomatic. -Patient was empirically on antibiotics due to right breast cellulitis as well as neck abscess/cellulitis.  Uncontrolled type 1 diabetes mellitus with hyperglycemia ([place]) Uncontrolled type I diabetic with a last hemoglobin A1c of 9.8 (09/24/2021). Basal insulin appears it was d/c'd due to hypoglycemia?  -Patient maintained on  sliding scale insulin during the hospitalization.  -Patient was discharged home on Semglee 5 units daily with outpatient follow-up with PCP.    Hyponatremia Overloaded - improved with diuresis and HD.  Nephrotic syndrome - Was on albumin and IV Lasix.   -Albumin completed.   -On IV Lasix and HD. -Lasix subsequently discontinued. -Outpatient follow-up with nephrology.  AKI (acute kidney injury) ([place]) Creatinine 2.95 by day of discharge.  Baseline ~3 Renal US negative for hydronephrosis.  Appearred overloaded early on in the hospitalization, LE edema appears to be improving gradually with diuresis and HD. Renal consulted, will follow the patient throughout the hospitalization- temp HD cath placed  Diuresis with albumin given edema/nephrotic syndrome -Patient also underwent hemodialysis on 10/14/2021, 10/15/2021 per nephrology. 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 -Per nephrology will eventually need tunneled HD catheter and AVF/AVG placement once infection under control. -Patient assessed by interventional radiology s/p placement of dialysis catheter Monday, 10/19/2021. -Patient sent in for hemodialysis in the outpatient setting on Monday Wednesday Friday.  First dialysis session will be tomorrow 10/13/2021.  -Outpatient follow-up with nephrology.  Peripheral edema Diuresis with albumin for nephrotic syndrome. -Outpatient follow-up  with nephrology.  Hypokalemia -On hemodialysis. -Per nephrology.   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 chronic kidney disease ([name]) Follow Elevated ferritin, low iron - normal folate, elevated b12 S/p 1 unit pRBC -Hemoglobin stabilized at 9.5 by day of discharge from a lower  6.9 (10/17/2021 ) after transfusion of 2 units packed red blood cells during HD on 10/17/2021.   -Patient with no overt bleeding.  Protein-calorie malnutrition, severe ([place]) -Patient was placed on nutritional supplementation.  Physical deconditioning Patient seen  by PT during the hospitalization, patient improved slowly and patient with discharge home with home health therapies.   Hypomagnesemia Repleted.   Broken tooth Upper right Pain related to this Continue abx for now  Needs dental f/u outpatient        Procedures: CT soft tissue neck 10/02/2021 Chest x-ray 10/01/2021, 10/18/2021 Renal ultrasound 10/07/2021 Ultrasound of left breast 10/04/2021 Ultrasound of the head and neck 10/02/2021 Irrigation and debridement neck abscess by Dr. Rosendo Gros 10/04/2021/general surgery. Transfusion 2 units packed red blood cells 10/17/2021 during HD. Transfusion 1 unit packed red blood cells 10/11/2021. Conversion to temp HD line to tunneled HD line per IR, Dr.El-Abd 10/19/2021.   Consultations: General surgery: Dr. Donne Hazel 10/03/2021 Nephrology: Dr. Candiss Norse 10/07/2021 Wound care RN  Discharge Exam: Vitals:   10/22/21 1335 10/22/21 1652  BP: (!) 146/93 (!) 131/98  Pulse: (!) 101 96  Resp: 16 16  Temp: 98.2 F (36.8 C) 97.7 F (36.5 C)  SpO2: 96% 99%    General: NAD Cardiovascular: Regular rate rhythm no murmurs rubs or gallops.  No JVD.  No lower extremity edema. Respiratory: Clear to auscultation bilaterally.  No wheezes, no crackles, no rhonchi.  Discharge Instructions   Discharge Instructions     Diet - low sodium heart healthy   Complete by: As directed    Discharge wound care:   Complete by: As directed       Wound care  Daily      Comments: Wound care to DTPI to right plantar heel:  cleanse with NS, pat dry. Cover with silicone foam dressing to heel. Float heels.  10/19/21 1138     10/13/21 1224    Foam dressing  Until discontinued      Comments: Foam dressing to right heel, change Q 3 days or PRN soiling  10/13/21 1223   10/06/21 1300    Wound care  Every shift      Comments: BID wet to dry dressing changes to left posterior neck   Increase activity slowly   Complete by: As directed       Allergies as of 10/22/2021   No  Known Allergies      Medication List     STOP taking these medications    insulin glargine-yfgn 100 UNIT/ML injection Commonly known as: SEMGLEE Replaced by: insulin glargine-yfgn 100 UNIT/ML Pen   metoprolol tartrate 50 MG tablet Commonly known as: LOPRESSOR   polyethylene glycol-electrolytes 420 g solution Commonly known as: TriLyte   potassium chloride 20 MEQ packet Commonly known as: KLOR-CON   sulfamethoxazole-trimethoprim 800-160 MG tablet Commonly known as: BACTRIM DS   torsemide 20 MG tablet Commonly known as: DEMADEX       TAKE these medications    acetaminophen 325 MG tablet Commonly known as: TYLENOL Take 650 mg by mouth every 6 (six) hours as needed for moderate pain (for pain).   blood glucose meter kit and supplies Kit Dispense based on patient and insurance preference. Use up to four times daily as directed.  collagenase 250 UNIT/GM ointment Commonly known as: SANTYL Apply topically daily. Start taking on: October 23, 2021   feeding supplement Liqd Take 237 mLs by mouth 2 (two) times daily between meals.   FreeStyle Libre 2 Reader Kerrin Mo As directed   [employer] 2 Sensor Misc 1 Piece by Does not apply route every 14 (fourteen) days.   insulin glargine-yfgn 100 UNIT/ML Pen Commonly known as: SEMGLEE Inject 5 Units into the skin daily. Replaces: insulin glargine-yfgn 100 UNIT/ML injection   Insulin Pen Needle 31G X 4 MM Misc Use as directed to administer insulin.   oxyCODONE 5 MG immediate release tablet Commonly known as: Oxy IR/ROXICODONE Take 1 tablet (5 mg total) by mouth every 6 (six) hours as needed for moderate pain or severe pain (5 mg for moderate, 10 mg for severe).   rosuvastatin 10 MG tablet Commonly known as: Crestor Take 1 tablet (10 mg total) by mouth daily.   tamsulosin 0.4 MG Caps capsule Commonly known as: FLOMAX Take 1 capsule (0.4 mg total) by mouth daily after supper.   vitamin B-12 1000 MCG tablet Commonly  known as: CYANOCOBALAMIN Take 1,000 mcg by mouth daily.               Durable Medical Equipment  (From admission, onward)           Start     Ordered   10/22/21 1228  For home use only DME 3 n 1  Once        10/22/21 1227   10/22/21 1141  For home use only DME Walker rolling  Once       Question Answer Comment  Walker: With 5 Inch Wheels   Patient needs a walker to treat with the following condition Weakness      10/22/21 1141              Discharge Care Instructions  (From admission, onward)           Start     Ordered   10/22/21 0000  Discharge wound care:       Comments:    Wound care  Daily      Comments: Wound care to DTPI to right plantar heel:  cleanse with NS, pat dry. Cover with silicone foam dressing to heel. Float heels.  10/19/21 1138     10/13/21 1224    Foam dressing  Until discontinued      Comments: Foam dressing to right heel, change Q 3 days or PRN soiling  10/13/21 1223   10/06/21 1300    Wound care  Every shift      Comments: BID wet to dry dressing changes to left posterior neck   10/22/21 1639           No Known Allergies  Follow-up Information     Rolm Bookbinder, MD Follow up on 11/05/2021.   Specialty: General Surgery Why: 1020am. Please arrive 30 minutes prior to your appointment for paperwork. Please bring a copy of your photo ID and insurance card. Contact information: [address]  [place] [address] [phone]         Coral Spikes, DO. Schedule an appointment as soon as possible for a visit in 2 week(s).   Specialty: Family Medicine Contact information: [place] [address] [place]. Go on 10/23/2021.   Why: Schedule is Monday/Wednesday/Friday with 11:15  chair time.  Please arrive at 10:30 on Friday, June 16 to complete paperwork prior to treatment. Contact information: [address] [place]  [address] [phone]         [facility] Follow up.   Specialty: Home Health Services Contact information: [place] [address] [phone]                  The results of significant diagnostics from this hospitalization (including imaging, microbiology, ancillary and laboratory) are listed below for reference.    Significant Diagnostic Studies: IR Fluoro Guide CV Line Right  Result Date: 10/19/2021 INDICATION: Need for long-term dialysis EXAM: Conversion of existing central venous catheter for a tunneled hemodialysis catheter using fluoroscopic guidance MEDICATIONS: Per EMR ANESTHESIA/SEDATION: Moderate (conscious) sedation was employed during this procedure. A total of Versed 1 mg and Fentanyl 50 mcg was administered intravenously. Moderate Sedation Time: 23 minutes. The patient's level of consciousness and vital signs were monitored continuously by radiology nursing throughout the procedure under my direct supervision. FLUOROSCOPY TIME:  Fluoroscopy Time: 0.5 minutes (5 mGy) COMPLICATIONS: None immediate. PROCEDURE: Informed written consent was obtained from the patient after a thorough discussion of the procedural risks, benefits and alternatives. All questions were addressed. Maximal Sterile Barrier Technique was utilized including caps, mask, sterile gowns, sterile gloves, sterile drape, hand hygiene and skin antiseptic. A timeout was performed prior to the initiation of the procedure. The patient was placed supine on the exam table. The right neck and chest was prepped and draped in the standard sterile fashion with inclusion of the existing temporary dialysis catheter the sterile field. Over an 035 wire, the existing dialysis catheter was removed for a 14 [place] dilator. Attention was then turned to an infraclavicular location, where a small dermatotomy was made after the administration of additional local anesthetic. From this location, a  19 cm tip-to-cuff hemodialysis catheter was tunneled to the venotomy site. A peel-away sheath was then advanced over the access wire. Through this peel-away sheath, the hemodialysis catheter was advanced into the central veins, such that the tip was positioned near the superior cavoatrial junction. The line was found to flush and aspirate appropriately. It was sutured to the skin using 0 silk suture, and the venotomy was closed with Dermabond. A sterile dressing was placed. The patient tolerated the procedure well without immediate complication. IMPRESSION: Successful conversion of existing central venous catheter for a new tunneled hemodialysis catheter via the right internal jugular vein. The line is ready for immediate use. Electronically Signed   By: Albin Felling M.D.   On: 10/19/2021 11:16   DG Chest 2 View  Result Date: 10/18/2021 CLINICAL DATA:  Leg swelling EXAM: CHEST - 2 VIEW COMPARISON:  10/02/2011 FINDINGS: Cardiomegaly. Large-bore right neck multi lumen vascular catheter. New, small, layering bilateral pleural effusions and associated atelectasis or consolidation. Osseous structures unremarkable. IMPRESSION: 1. Cardiomegaly. 2. New, small, layering bilateral pleural effusions and associated atelectasis or consolidation. Electronically Signed   By: Delanna Ahmadi M.D.   On: 10/18/2021 15:19   IR Fluoro Guide CV Line Right  Result Date: 10/14/2021 INDICATION: 26 year old female with renal failure requiring central venous access for hemodialysis. EXAM: NON-TUNNELED CENTRAL VENOUS HEMODIALYSIS CATHETER PLACEMENT WITH ULTRASOUND AND FLUOROSCOPIC GUIDANCE COMPARISON:  None Available. MEDICATIONS: None FLUOROSCOPY TIME:  One mGy COMPLICATIONS: None immediate. PROCEDURE: Informed written consent was obtained from the patient after a discussion of the risks, benefits, and alternatives to treatment. Questions regarding the procedure were encouraged  and answered. The right neck and chest were prepped with  chlorhexidine in a sterile fashion, and a sterile drape was applied covering the operative field. Maximum barrier sterile technique with sterile gowns and gloves were used for the procedure. A timeout was performed prior to the initiation of the procedure. After the overlying soft tissues were anesthetized, a small venotomy incision was created and a micropuncture kit was utilized to access the internal jugular vein. Real-time ultrasound guidance was utilized for vascular access including the acquisition of a permanent ultrasound image documenting patency of the accessed vessel. The microwire was utilized to measure appropriate catheter length. A Rosen wire was advanced to the level of the IVC. Under fluoroscopic guidance, the venotomy was serially dilated, ultimately allowing placement of a 15 cm temporary Trialysis catheter with tip ultimately terminating within the superior aspect of the right atrium. Final catheter positioning was confirmed and documented with a spot radiographic image. The catheter aspirates and flushes normally. The catheter was flushed with appropriate volume heparin dwells. The catheter exit site was secured with a 0-silk retention suture. A dressing was placed. The patient tolerated the procedure well without immediate post procedural complication. IMPRESSION: Successful placement of a right internal jugular approach 15 cm temporary dialysis catheter with tip terminating with in the superior aspect of the right atrium. The catheter is ready for immediate use. PLAN: This catheter may be converted to a tunneled dialysis catheter at a later date as indicated. Ruthann Cancer, MD [facility] Rad[facility] Electronically Signed   By: Ruthann Cancer M.D.   On: 10/14/2021 10:49   IR US Guide Vasc Access Right  Result Date: 10/14/2021 INDICATION: 26 year old female with renal failure requiring central venous access for hemodialysis. EXAM: NON-TUNNELED  CENTRAL VENOUS HEMODIALYSIS CATHETER PLACEMENT WITH ULTRASOUND AND FLUOROSCOPIC GUIDANCE COMPARISON:  None Available. MEDICATIONS: None FLUOROSCOPY TIME:  One mGy COMPLICATIONS: None immediate. PROCEDURE: Informed written consent was obtained from the patient after a discussion of the risks, benefits, and alternatives to treatment. Questions regarding the procedure were encouraged and answered. The right neck and chest were prepped with chlorhexidine in a sterile fashion, and a sterile drape was applied covering the operative field. Maximum barrier sterile technique with sterile gowns and gloves were used for the procedure. A timeout was performed prior to the initiation of the procedure. After the overlying soft tissues were anesthetized, a small venotomy incision was created and a micropuncture kit was utilized to access the internal jugular vein. Real-time ultrasound guidance was utilized for vascular access including the acquisition of a permanent ultrasound image documenting patency of the accessed vessel. The microwire was utilized to measure appropriate catheter length. A Rosen wire was advanced to the level of the IVC. Under fluoroscopic guidance, the venotomy was serially dilated, ultimately allowing placement of a 15 cm temporary Trialysis catheter with tip ultimately terminating within the superior aspect of the right atrium. Final catheter positioning was confirmed and documented with a spot radiographic image. The catheter aspirates and flushes normally. The catheter was flushed with appropriate volume heparin dwells. The catheter exit site was secured with a 0-silk retention suture. A dressing was placed. The patient tolerated the procedure well without immediate post procedural complication. IMPRESSION: Successful placement of a right internal jugular approach 15 cm temporary dialysis catheter with tip terminating with in the superior aspect of the right atrium. The catheter is ready for immediate  use. PLAN: This catheter may be converted to a tunneled dialysis catheter at a later  date as indicated. Ruthann Cancer, MD [facility] Electronically Signed   By: Ruthann Cancer M.D.   On: 10/14/2021 10:49   US RENAL  Result Date: 10/07/2021 CLINICAL DATA:  AKI EXAM: RENAL / URINARY TRACT ULTRASOUND COMPLETE COMPARISON:  CT 06/07/2019 FINDINGS: Right Kidney: Renal measurements: 10.5 x 4.8 x 6.1 cm = volume: 159.90 mL. Increased renal cortical echogenicity. No hydronephrosis. Left Kidney: Renal measurements: 10.9 x 5.5 x 4.3 cm = volume: There 34.20 mL. Increased renal cortical echogenicity. No hydronephrosis. Bladder: Not visualized. Other: Right pleural effusion. IMPRESSION: Increased renal cortical echogenicity bilaterally as can be seen in medical renal disease. No hydronephrosis. Right pleural effusion. Electronically Signed   By: Maurine Simmering M.D.   On: 10/07/2021 15:44   US BREAST LTD UNI RIGHT INC AXILLA  Result Date: 10/06/2021 CLINICAL DATA:  26 year old female with swelling, erythema and scabbing of the right breast. EXAM: ULTRASOUND OF THE RIGHT BREAST COMPARISON:  Prior studies. FINDINGS: Targeted ultrasound is performed, showing diffuse thickening of the skin overlying the medial right breast with focal hypoechoic lesion entirely within the skin corresponding to an overlying scab. There is marked, diffuse to edema of the underlying soft tissue without focal fluid collection identified within the limitations of this study. IMPRESSION: Diffuse skin thickening and edema of the underlying soft tissue in the medial right breast. No definite or focal fluid collection identified within the limitations of this study. RECOMMENDATION: Clinical and symptomatic follow-up. I have discussed the findings and recommendations with the patient. If applicable, a reminder letter will be sent to the patient regarding the next appointment. BI-RADS CATEGORY  2:  Benign. Electronically Signed   By: Kristopher Oppenheim M.D.   On: 10/06/2021 10:59   US SOFT TISSUE HEAD & NECK (NON-THYROID)  Result Date: 10/02/2021 CLINICAL DATA:  Left head neck abscess with tenderness. Abscess drainage EXAM: ULTRASOUND OF HEAD/NECK SOFT TISSUES TECHNIQUE: Ultrasound examination of the head and neck soft tissues was performed in the area of clinical concern. COMPARISON:  Subsequent noncontrast neck CT. FINDINGS: In the left neck there is muscular increased echogenicity and expansion with regional echogenic fat stranding and enlarged lymph nodes with normal morphology. No drainable collection. IMPRESSION: Cellulitis, myositis, and adenitis in the left neck without visible abscess. Electronically Signed   By: Jorje Guild M.D.   On: 10/02/2021 11:58   CT SOFT TISSUE NECK WO CONTRAST  Result Date: 10/02/2021 CLINICAL DATA:  Soft tissue swelling, infection suspected, neck xray done EXAM: CT NECK WITHOUT CONTRAST TECHNIQUE: Multidetector CT imaging of the neck was performed following the standard protocol without intravenous contrast. RADIATION DOSE REDUCTION: This exam was performed according to the departmental dose-optimization program which includes automated exposure control, adjustment of the mA and/or kV according to patient size and/or use of iterative reconstruction technique. COMPARISON:  None Available. FINDINGS: Pharynx and larynx: Normal. No mass or swelling. Salivary glands: No inflammation, mass, or stone. Thyroid: Normal. Lymph nodes: Enlarged left upper cervical chain lymph nodes. Vascular: Decreased attenuation of the blood pool. Limited intracranial: Negative. Visualized orbits: Negative. Mastoids and visualized paranasal sinuses: Mild paranasal sinus mucosal thickening in the visualized sinuses. Skeleton: No acute or aggressive process. Upper chest: Motion limited assessment without definite consolidation the visualized lung apices. Other: In the posterior and upper left  neck there is heterogeneous subcutaneous edema and edematous appearance of the paraspinal muscles, compatible with cellulitis, phlegmon, and myositis given the clinical history. This area of phlegmon measures up to approximately 4.0  x 2.4 x 4.8 cm. Evaluation for peripherally enhancing/drainable abscess is limited due to the absence of contrast. IMPRESSION: 1. In the posterior and upper left neck there is heterogeneous subcutaneous edema and edematous subjacent paraspinal muscles, compatible with cellulitis, phlegmon, and myositis given the clinical history. This area of phlegmon measures up to approximately 4.0 x 2.4 x 4.8 cm. Evaluation for peripherally enhancing/drainable abscess is limited due to the absence of contrast. If the patient is able, postcontrast imaging could further evaluate. Also, see concurrent ultrasound for additional evaluation for drainable collection. 2. Enlarged left upper cervical chain lymph nodes, nonspecific but probably reactive given the above findings. Recommend clinical follow-up. 3. Milder more diffuse edema within the subcutaneous soft tissues, possibly representing anasarca. 4. Decreased attenuation of the blood pool, which can be seen with anemia. Electronically Signed   By: Margaretha Sheffield M.D.   On: 10/02/2021 11:09   DG Chest Portable 1 View  Result Date: 10/01/2021 CLINICAL DATA:  Suspected sepsis EXAM: PORTABLE CHEST 1 VIEW COMPARISON:  10/27/2019 FINDINGS: Cardiomegaly. No confluent airspace opacities or effusions. No acute bony abnormality. IMPRESSION: Cardiomegaly.  No active disease. Electronically Signed   By: Rolm Baptise M.D.   On: 10/01/2021 22:02    Microbiology: Recent Results (from the past 240 hour(s))  Culture, blood (Routine X 2) w Reflex to ID Panel     Status: None (Preliminary result)   Collection Time: 10/18/21  9:37 AM   Specimen: BLOOD  Result Value Ref Range Status   Specimen Description BLOOD LEFT ANTECUBITAL  Final   Special Requests    Final    BOTTLES DRAWN AEROBIC AND ANAEROBIC Blood Culture adequate volume   Culture   Final    NO GROWTH 4 DAYS Performed at [facility], [address] [place], [place] [address]    Report Status PENDING  Incomplete  Culture, blood (Routine X 2) w Reflex to ID Panel     Status: None (Preliminary result)   Collection Time: 10/18/21  9:37 AM   Specimen: BLOOD RIGHT HAND  Result Value Ref Range Status   Specimen Description BLOOD RIGHT HAND  Final   Special Requests   Final    BOTTLES DRAWN AEROBIC AND ANAEROBIC Blood Culture adequate volume   Culture   Final    NO GROWTH 4 DAYS Performed at [facility], [place] [address], [place], [place] [address]    Report Status PENDING  Incomplete     Labs: Basic Metabolic Panel: Recent Labs  Lab 10/18/21 0937 10/19/21 0404 10/20/21 0049 10/21/21 0052 10/22/21 0057  NA 138 138 137 139 139  K 4.2 3.7 3.3* 3.6 3.7  CL 103 103 101 103 101  CO2 26 28 28 30 28   GLUCOSE 106* 85 104* 108* 128*  BUN 23* 24* 26* 12 14  CREATININE 3.14* 3.53* 3.83* 2.56* 2.95*  CALCIUM 8.6* 8.4* 8.4* 8.1* 8.5*  PHOS 2.9 3.1 3.4 2.1* 3.0   Liver Function Tests: Recent Labs  Lab 10/18/21 0937 10/19/21 0404 10/20/21 0049 10/21/21 0052 10/22/21 0057  ALBUMIN 2.3* 2.1* 2.1* 2.2* 2.2*   No results for input(s): "LIPASE", "AMYLASE" in the last 168 hours. No results for input(s): "AMMONIA" in the last 168 hours. CBC: Recent Labs  Lab 10/18/21 0100 10/18/21 0937 10/19/21 0404 10/20/21 0049 10/21/21 0052 10/22/21 0057  WBC 18.7* 15.5* 16.1* 14.7* 16.6* 13.6*  NEUTROABS 14.2*  --  12.3* 11.0*  --   --   HGB 9.4* 10.0* 9.5* 9.1*  9.4* 9.5*  HCT 29.8* 31.5* 30.0* 28.9* 29.8* 31.2*  MCV 85.1 85.4 86.5 87.6 86.9 88.6  PLT 98* 101* 107* 116* 140* 154   Cardiac Enzymes: No results for input(s): "CKTOTAL", "CKMB", "CKMBINDEX", "TROPONINI" in the last 168 hours. BNP: BNP (last 3 results) Recent Labs    06/12/21 2102 10/01/21 2053  BNP  239.0* 626.0*    ProBNP (last 3 results) No results for input(s): "PROBNP" in the last 8760 hours.  CBG: Recent Labs  Lab 10/21/21 1224 10/21/21 1609 10/21/21 1957 10/22/21 1330 10/22/21 1655  GLUCAP 104* 109* 122* 81 139*       Signed:  Irine Seal MD.  [facility] 10/22/2021, 5:35 PM

## 2021-10-22 NOTE — Progress Notes (Signed)
PT Cancellation Note  Patient Details Name: QUINCIE HAROON MRN: [id number] DOB: 09-19-1995   Cancelled Treatment:    Reason Eval/Treat Not Completed: Patient at procedure or test/unavailable. Pt at HD at this time. Will  follow up later today as time allows vs another date. Acute PT to continue during pt's hospital stay.    Willow Ora, PTA, CLT Acute Rehab Services [phone] 10/22/21, 8:18 AM

## 2021-10-22 NOTE — Progress Notes (Signed)
Patient ID: Lindsey Murillo, female   DOB: 1996/04/08, 26 y.o.   MRN: [id number]  Assessment/Plan:   ESRD - patient with very advanced CKDIV and then additional insults in setting of severe sepsis.  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  Will need to plan for permanent vascular access after her infectious issues have been resolved.  Temp HD catheter placed by IR 10/13/21 and first HD session 10/14/21, then 10/15/21 and 10/17/21.  Appreciate VIR converting to a tunneled HD catheter on 6/12. Blood cultures negative. Avoid nephrotoxic medications including NSAIDs and iodinated intravenous contrast exposure unless the latter is absolutely indicated.   Preferred narcotic agents for pain control are hydromorphone, fentanyl, and methadone. Morphine should not be used.  Avoid Baclofen and avoid oral sodium phosphate and magnesium citrate based laxatives / bowel preps.  Continue strict Input and Output monitoring. Will monitor the patient closely with you and intervene or adjust therapy as indicated by changes in clinical status/labs. Stopped the oral HCO3 and MgOxide, Lasix, Phoslo.  Eventually will need AVF/AVG once infection is under control; probably should perform as an outpatient. She is left handed. Accepted for outpatient HD at [facility] M[facility] 1st treatment Fri.  Tolerated HD 6/13 w/ 2L UF RIJ TC and seen on hd this AM 4K bath 3L goal 161/112.    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  Reviewed by surgery and nothing to drain. On Ancef and Doxy.  Hypervolemic hyponatremia - slowly improving with diuresis + dialysis.  No symptoms. Nephrotic syndrome with edema - Stopped lasix 120 mg bid once on hd. HTN  - stable Anemia of CKD stage IV.  Started ESA (60SQ on 6/7), transfuse for Hgb <7 -> change to IV. Uncontrolled DM type 1 - per primary Severe protein malnutrition - per primary.    S: Feels well, no new complaints.  O:BP (!) 162/107 (BP Location: Left Arm)   Pulse (!) 112   Temp 97.7 F (36.5 C) (Axillary)   Resp 13   Ht 5' 7.01" (1.702 m)   Wt 54.9 kg   SpO2 93%   BMI 18.95 kg/m   Intake/Output Summary (Last 24 hours) at 10/22/2021 0857 Last data filed at 10/22/2021 0655 Gross per 24 hour  Intake 240 ml  Output --  Net 240 ml    Intake/Output: I/O last 3 completed shifts: In: 240 [P.O.:240] Out: -   Intake/Output this shift:  No intake/output data recorded. Weight change: 2.2 kg Gen: NAD CVS: RRR Resp:CTA Abd: +BS, soft, NT/ND Ext: tr edema BLE, L>R Access: RIJ TC  Recent Labs  Lab 10/17/21 0149 10/18/21 0100 10/18/21 0937 10/19/21 0404 10/20/21 0049 10/21/21 0052 10/22/21 0057  NA 141 139 138 138 137 139 139  K 4.1 4.0 4.2 3.7 3.3* 3.6 3.7  CL 102 102 103 103 101 103 101  CO2 26 28 26 28 28 30 28   GLUCOSE 142* 191* 106* 85 104* 108* 128*  BUN 40* 23* 23* 24* 26* 12 14  CREATININE 4.21* 3.12* 3.14* 3.53* 3.83* 2.56* 2.95*  ALBUMIN 2.2* 2.1* 2.3* 2.1* 2.1* 2.2* 2.2*  CALCIUM 8.5* 8.2* 8.6* 8.4* 8.4* 8.1* 8.5*  PHOS 3.5 2.6 2.9 3.1 3.4 2.1* 3.0   Liver Function Tests: Recent Labs  Lab 10/20/21 0049 10/21/21 0052 10/22/21 0057  ALBUMIN 2.1* 2.2* 2.2*   No results for input(s): "LIPASE", "AMYLASE" in the last 168 hours. No results for input(s): "AMMONIA" in the last 168 hours. CBC: Recent Labs  Lab 10/18/21 0100 10/18/21 0937 10/19/21 0404 10/20/21 0049 10/21/21 0052 10/22/21 0057  WBC 18.7* 15.5* 16.1* 14.7* 16.6* 13.6*  NEUTROABS 14.2*  --  12.3* 11.0*  --   --   HGB 9.4* 10.0* 9.5* 9.1* 9.4* 9.5*  HCT 29.8* 31.5* 30.0* 28.9* 29.8* 31.2*  MCV 85.1 85.4 86.5 87.6 86.9 88.6  PLT 98* 101* 107* 116* 140* 154   Cardiac Enzymes: No  results for input(s): "CKTOTAL", "CKMB", "CKMBINDEX", "TROPONINI" in the last 168 hours. CBG: Recent Labs  Lab 10/20/21 2236 10/21/21 0826 10/21/21 1224 10/21/21 1609 10/21/21 1957  GLUCAP 113* 97 104* 109* 122*    Iron Studies: No results for input(s): "IRON", "TIBC", "TRANSFERRIN", "FERRITIN" in the last 72 hours. Studies/Results: No results found.  sodium chloride   Intravenous Once   acetaminophen  650 mg Oral Q6H   acetaminophen  650 mg Oral Once   Chlorhexidine Gluconate Cloth  6 each Topical Daily   Chlorhexidine Gluconate Cloth  6 each Topical Q0600   collagenase   Topical Daily   [START ON 10/27/2021] darbepoetin (ARANESP) injection - DIALYSIS  60 mcg Intravenous Q Tue-HD   diphenhydrAMINE  25 mg Oral Once   doxycycline  100 mg Oral Q12H   feeding supplement (NEPRO CARB STEADY)  237 mL Oral BID BM   heparin  5,000 Units Subcutaneous Q8H   insulin aspart  0-6 Units Subcutaneous TID WC   multivitamin  1 tablet Oral QHS   rosuvastatin  10 mg Oral Daily   tamsulosin  0.4 mg Oral QPC supper    BMET    Component Value Date/Time   NA 139 10/22/2021 0057   NA 132 (L) 09/24/2021 1453   K 3.7 10/22/2021 0057   CL 101 10/22/2021 0057   CO2 28 10/22/2021 0057   GLUCOSE 128 (H) 10/22/2021 0057   BUN 14 10/22/2021 0057   BUN 36 (H) 09/24/2021 1453   CREATININE 2.95 (H) 10/22/2021 0057   CREATININE 0.98 08/29/2019 1637   CALCIUM 8.5 (L) 10/22/2021 0057   GFRNONAA 22 (L) 10/22/2021 0057   GFRAA >60 10/27/2019 2021   CBC    Component Value Date/Time   WBC 13.6 (H) 10/22/2021 0057   RBC 3.52 (L) 10/22/2021 0057   HGB 9.5 (L) 10/22/2021 0057   HGB 9.5 (L) 09/24/2021 1453   HCT 31.2 (L) 10/22/2021 0057   HCT 28.1 (L) 09/24/2021 1453   PLT 154 10/22/2021 0057   PLT 248 09/24/2021 1453   MCV 88.6 10/22/2021 0057   MCV 83 09/24/2021 1453   MCH 27.0 10/22/2021 0057   MCHC 30.4 10/22/2021 0057   RDW 19.6 (H) 10/22/2021 0057   RDW 14.3 09/24/2021 1453   LYMPHSABS 2.0  10/20/2021 0049   MONOABS 0.8 10/20/2021 0049   EOSABS 0.6 (H) 10/20/2021 0049   BASOSABS 0.2 (H) 10/20/2021 0049

## 2021-10-22 NOTE — Progress Notes (Signed)
OT Cancellation Note  Patient Details Name: MURRY KHIEV MRN: [id number] DOB: 1995-11-27   Cancelled Treatment:    Reason Eval/Treat Not Completed: Patient at procedure or test/ unavailable: Attempted at 0842 and again at 1354, and pt remains off floor for dialysis.   Julien Girt 10/22/2021, 1:54 PM

## 2021-10-22 NOTE — TOC Progression Note (Addendum)
Transition of Care [facility]) - Progression Note    Patient Details  Name: DEBORHA MOSELEY MRN: [id number] Date of Birth: 04/22/1996  Transition of [facility]) CM/SW Contact  Jacalyn Lefevre Edson Snowball, RN Phone Number: 10/22/2021, 3:10 PM  Clinical Narrative:     Spoke to mother and patient at bedside. They live together . Discussed HHPT/OT , walker and 3 in 1 , both in agreement.  Cory with [facility] accepted referral .   Ordered walker and 3 in 1 with [facility]. DME will be delivered to patient's hospital room this afternoon. [employer] called back they do not accept patient's insurance. Called Jermaine with [employer] , they accept patient's insurance and will deliver DME to room this afternoon  Expected Discharge Plan: [place] Barriers to Discharge: Continued Medical Work up  Expected Discharge Plan and Services Expected Discharge Plan: [place]   Discharge Planning Services: CM Consult Post Acute Care Choice: [place] arrangements for the past 2 months: Single Family Home                 DME Arranged: 3-N-1, Walker rolling DME Agency: [facility] Date DME Agency Contacted: 10/22/21 Time DME Agency Contacted: [phone] Representative spoke with at DME Agency: Letta Kocher HH Arranged: PT, OT [place] Agency: [place] Date [place]: 10/22/21 Time [place]: 1508 Representative spoke with at [place]: Tommi Rumps   Social Determinants of Health ([place]) Interventions    Readmission Risk Interventions     No data to display

## 2021-10-22 NOTE — Progress Notes (Signed)
Received patient in bed, alert and oriented. Informed consent signed and in chart.  Time tx initiated: 822  Pre HD weight: 54.9 kg  Pre HD VS: 150/100 BP HR 116 97%- RA  Time tx completed: 3300  HD treatment completed. Patient tolerated well. Fistula/Graft/HD catheter without signs and symptoms of complications. Patient transported back to the room, alert and orient and in no acute distress. Report given to bedside RN.  Total UF removed: 3.0L   Medication given:    Post HD VS:  157/113  100 % P-101 R- 19 98.2- T  Post HD weight: 57.8

## 2021-10-23 ENCOUNTER — Inpatient Hospital Stay (HOSPITAL_COMMUNITY): Payer: 59

## 2021-10-23 ENCOUNTER — Other Ambulatory Visit (HOSPITAL_COMMUNITY): Payer: Self-pay

## 2021-10-23 ENCOUNTER — Ambulatory Visit (HOSPITAL_COMMUNITY): Payer: 59

## 2021-10-23 ENCOUNTER — Inpatient Hospital Stay (HOSPITAL_COMMUNITY): Payer: 59 | Admitting: Physician Assistant

## 2021-10-23 DIAGNOSIS — K529 Noninfective gastroenteritis and colitis, unspecified: Secondary | ICD-10-CM | POA: Insufficient documentation

## 2021-10-23 DIAGNOSIS — N250 Renal osteodystrophy: Secondary | ICD-10-CM | POA: Insufficient documentation

## 2021-10-23 DIAGNOSIS — I120 Hypertensive chronic kidney disease with stage 5 chronic kidney disease or end stage renal disease: Secondary | ICD-10-CM | POA: Insufficient documentation

## 2021-10-23 LAB — CULTURE, BLOOD (ROUTINE X 2)
Culture: NO GROWTH
Culture: NO GROWTH
Special Requests: ADEQUATE
Special Requests: ADEQUATE

## 2021-11-03 ENCOUNTER — Encounter (HOSPITAL_COMMUNITY): Payer: Self-pay | Admitting: Hematology

## 2021-11-05 ENCOUNTER — Other Ambulatory Visit: Payer: Self-pay | Admitting: General Surgery

## 2021-11-11 ENCOUNTER — Other Ambulatory Visit: Payer: Self-pay

## 2021-11-11 ENCOUNTER — Encounter (HOSPITAL_COMMUNITY): Payer: Self-pay | Admitting: General Surgery

## 2021-11-11 ENCOUNTER — Ambulatory Visit: Payer: 59 | Admitting: Vascular Surgery

## 2021-11-11 ENCOUNTER — Encounter: Payer: Self-pay | Admitting: Vascular Surgery

## 2021-11-11 VITALS — BP 170/109 | HR 105 | Temp 98.4°F | Ht 67.0 in | Wt 124.4 lb

## 2021-11-11 DIAGNOSIS — N186 End stage renal disease: Secondary | ICD-10-CM

## 2021-11-11 DIAGNOSIS — Z992 Dependence on renal dialysis: Secondary | ICD-10-CM

## 2021-11-11 NOTE — Progress Notes (Signed)
[facility] [place]  Patient name: Lindsey Murillo MRN: [id number] DOB: 20-Jun-1995 Sex: female  REASON FOR CONSULT: Discuss access for hemodialysis  HPI: Lindsey Murillo is a 26 y.o. female, who is here today for discussion of permanent access.  She is here today with her mother.  She has Barnell Shieh onset diabetes with progressive renal insufficiency and now hemodialysis.  She has dialysis at [facility] in [place].  She had catheter placed in mid June and reports that she has had no difficulty with this.  She is left-handed.  She is quite thin with a BMI of less than 20.  She does not have a pacemaker.  Past Medical History:  Diagnosis Date   Acanthosis nigricans, acquired    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 chronic kidney disease ([place]) 09/25/2021   Asthma    Diabetic autonomic neuropathy ([name])    Diabetic peripheral neuropathy ([place])    Environmental allergies    Goiter    Hypoglycemia associated with diabetes ([place])    Tachycardia    Thyroiditis, autoimmune    Type 1 diabetes mellitus in patient age 54-19 years with HbA1C goal below 7.5     Family History  Problem Relation Age of Onset   Diabetes Mother        Type II DM   Thyroid disease Mother    Diabetes Maternal Grandmother        Type II DM   Diabetes Cousin        Type II DM   Colon cancer Neg Hx    Colon polyps Neg Hx     SOCIAL HISTORY: Social History   Socioeconomic History   Marital status: Single    Spouse name: Not on file   Number of children: 0   Years of education: Not on file   Highest education level: High school graduate  Occupational History   Occupation: unemployed  Tobacco Use   Smoking status: Never   Smokeless tobacco: Never  Vaping Use   Vaping Use: Never used  Substance and Sexual Activity   Alcohol use: No   Drug use: No   Sexual activity: Never    Birth control/protection: Abstinence  Other Topics Concern   Not on file   Social History Narrative   Not on file   Social Determinants of Health   Financial Resource Strain: Not on file  Food Insecurity: No Food Insecurity (07/02/2019)   Hunger Vital Sign    Worried About Running Out of Food in the Last Year: Never true    Ran Out of Food in the Last Year: Never true  Transportation Needs: No Transportation Needs (07/02/2019)   PRAPARE - Hydrologist (Medical): No    Lack of Transportation (Non-Medical): No  Physical Activity: Not on file  Stress: Not on file  Social Connections: Moderately Integrated (07/02/2019)   Social Connection and Isolation Panel [NHANES]    Frequency of Communication with Friends and Family: More than three times a week    Frequency of Social Gatherings with Friends and Family: More than three times a week    Attends Religious Services: 1 to 4 times per year    Active Member of [employer] or Organizations: Yes    Attends Archivist Meetings: 1 to 4 times per year    Marital Status: Never married  Intimate Partner Violence: Not on file    No Known Allergies  Current Outpatient Medications  Medication  Sig Dispense Refill   acetaminophen (TYLENOL) 325 MG tablet Take 650 mg by mouth every 6 (six) hours as needed for moderate pain (for pain).     blood glucose meter kit and supplies KIT Dispense based on patient and insurance preference. Use up to four times daily as directed. 1 each 0   Continuous Blood Gluc Receiver (FREESTYLE LIBRE 2 READER) DEVI As directed 1 each 0   Continuous Blood Gluc Sensor (FREESTYLE LIBRE 2 SENSOR) MISC 1 Piece by Does not apply route every 14 (fourteen) days. 2 each 3   Cyanocobalamin (B-12 PO) Take 1 capsule by mouth daily.     Insulin Pen Needle 31G X 4 MM MISC Use as directed to administer insulin. 100 each 1   oxyCODONE (OXY IR/ROXICODONE) 5 MG immediate release tablet Take 1 tablet (5 mg total) by mouth every 6 (six) hours as needed for moderate pain or severe pain  (5 mg for moderate, 10 mg for severe). 15 tablet 0   collagenase (SANTYL) 250 UNIT/GM ointment Apply topically daily. (Patient not taking: Reported on 11/09/2021) 30 g 0   insulin glargine-yfgn (SEMGLEE) 100 UNIT/ML Pen Inject 5 Units into the skin daily. (Patient taking differently: Inject 1-2 Units into the skin 2 (two) times daily as needed (shortness of breath).) 3 mL 0   rosuvastatin (CRESTOR) 10 MG tablet Take 1 tablet (10 mg total) by mouth daily. (Patient not taking: Reported on 11/11/2021) 90 tablet 3   tamsulosin (FLOMAX) 0.4 MG CAPS capsule Take 1 capsule (0.4 mg total) by mouth daily after supper. (Patient not taking: Reported on 11/09/2021) 30 capsule 1   No current facility-administered medications for this visit.    REVIEW OF SYSTEMS:  [X] denotes positive finding, [ ] denotes negative finding Cardiac  Comments:  Chest pain or chest pressure:    Shortness of breath upon exertion:    Short of breath when lying flat:    Irregular heart rhythm:        Vascular    Pain in calf, thigh, or hip brought on by ambulation:    Pain in feet at night that wakes you up from your sleep:     Blood clot in your veins:    Leg swelling:  x       Pulmonary    Oxygen at home:    Productive cough:     Wheezing:         Neurologic    Sudden weakness in arms or legs:     Sudden numbness in arms or legs:     Sudden onset of difficulty speaking or slurred speech:    Temporary loss of vision in one eye:     Problems with dizziness:         Gastrointestinal    Blood in stool:     Vomited blood:         Genitourinary    Burning when urinating:     Blood in urine:        Psychiatric    Major depression:         Hematologic    Bleeding problems:    Problems with blood clotting too easily:        Skin    Rashes or ulcers: x       Constitutional    Fever or chills:      PHYSICAL EXAM: Vitals:   11/11/21 0842  BP: (!) 170/109  Pulse: (!) 105  Temp: 98.4 F (36.9 C)    TempSrc:  Temporal  SpO2: 99%  Weight: 124 lb 6.4 oz (56.4 kg)  Height: 5' 7" (1.702 m)    GENERAL: The patient is a well-nourished female, in no acute distress. The vital signs are documented above. CARDIOVASCULAR: 2+ radial and brachial pulses bilaterally.  Very small surface veins bilaterally. PULMONARY: There is good air exchange  MUSCULOSKELETAL: There are no major deformities or cyanosis. NEUROLOGIC: No focal weakness or paresthesias are detected. SKIN: Significant swelling of both lower extremities with the changes consistent with lymphedema in her lower extremities PSYCHIATRIC: The patient has a normal affect.  DATA:  I imaged her right arm veins with SonoSite ultrasound.  Her cephalic vein is very small throughout its course.  She does have a moderate size basilic vein with branching above the antecubital space with a larger branch going medially at the elbow.  MEDICAL ISSUES: Had a long discussion with the patient and her mother regarding access for hemodialysis.  I discussed the risk of catheter infection with prolonged use.  I discussed advantages and limitations of both AV fistula and AV graft.  She does appear to have adequate right arm basilic vein for attempted basilic vein fistula.  I Splane the first stage would be small incision at the antecubital space with brachiobasilic fistula.  We would then see her in the office in 4 to 6 weeks and assuming good maturation would return to the operating room for transposition.  I also explained the potential for not maturation.  If her vein appears to be inadequate at office follow-up would place right upper arm AV graft.  We have scheduled surgery for 11/24/2021 at [facility]   Christopher Glasscock F. Marialy Urbanczyk, MD FACS [facility] [place] Office Tel [phone] Pager [phone]  Note: Portions of this report may have been transcribed using voice recognition software.  Every effort has been made to ensure accuracy; however,  inadvertent computerized transcription errors may still be present.  

## 2021-11-11 NOTE — H&P (View-Only) (Signed)
[facility] [place]  Patient name: Lindsey Murillo MRN: [id number] DOB: 20-Jun-1995 Sex: female  REASON FOR CONSULT: Discuss access for hemodialysis  HPI: Lindsey Murillo is a 26 y.o. female, who is here today for discussion of permanent access.  She is here today with her mother.  She has Marlyce Mcdougald onset diabetes with progressive renal insufficiency and now hemodialysis.  She has dialysis at [facility] in [place].  She had catheter placed in mid June and reports that she has had no difficulty with this.  She is left-handed.  She is quite thin with a BMI of less than 20.  She does not have a pacemaker.  Past Medical History:  Diagnosis Date   Acanthosis nigricans, acquired    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 chronic kidney disease ([place]) 09/25/2021   Asthma    Diabetic autonomic neuropathy ([name])    Diabetic peripheral neuropathy ([place])    Environmental allergies    Goiter    Hypoglycemia associated with diabetes ([place])    Tachycardia    Thyroiditis, autoimmune    Type 1 diabetes mellitus in patient age 54-19 years with HbA1C goal below 7.5     Family History  Problem Relation Age of Onset   Diabetes Mother        Type II DM   Thyroid disease Mother    Diabetes Maternal Grandmother        Type II DM   Diabetes Cousin        Type II DM   Colon cancer Neg Hx    Colon polyps Neg Hx     SOCIAL HISTORY: Social History   Socioeconomic History   Marital status: Single    Spouse name: Not on file   Number of children: 0   Years of education: Not on file   Highest education level: High school graduate  Occupational History   Occupation: unemployed  Tobacco Use   Smoking status: Never   Smokeless tobacco: Never  Vaping Use   Vaping Use: Never used  Substance and Sexual Activity   Alcohol use: No   Drug use: No   Sexual activity: Never    Birth control/protection: Abstinence  Other Topics Concern   Not on file   Social History Narrative   Not on file   Social Determinants of Health   Financial Resource Strain: Not on file  Food Insecurity: No Food Insecurity (07/02/2019)   Hunger Vital Sign    Worried About Running Out of Food in the Last Year: Never true    Ran Out of Food in the Last Year: Never true  Transportation Needs: No Transportation Needs (07/02/2019)   PRAPARE - Hydrologist (Medical): No    Lack of Transportation (Non-Medical): No  Physical Activity: Not on file  Stress: Not on file  Social Connections: Moderately Integrated (07/02/2019)   Social Connection and Isolation Panel [NHANES]    Frequency of Communication with Friends and Family: More than three times a week    Frequency of Social Gatherings with Friends and Family: More than three times a week    Attends Religious Services: 1 to 4 times per year    Active Member of [employer] or Organizations: Yes    Attends Archivist Meetings: 1 to 4 times per year    Marital Status: Never married  Intimate Partner Violence: Not on file    No Known Allergies  Current Outpatient Medications  Medication  Sig Dispense Refill   acetaminophen (TYLENOL) 325 MG tablet Take 650 mg by mouth every 6 (six) hours as needed for moderate pain (for pain).     blood glucose meter kit and supplies KIT Dispense based on patient and insurance preference. Use up to four times daily as directed. 1 each 0   Continuous Blood Gluc Receiver (FREESTYLE LIBRE 2 READER) DEVI As directed 1 each 0   Continuous Blood Gluc Sensor (FREESTYLE LIBRE 2 SENSOR) MISC 1 Piece by Does not apply route every 14 (fourteen) days. 2 each 3   Cyanocobalamin (B-12 PO) Take 1 capsule by mouth daily.     Insulin Pen Needle 31G X 4 MM MISC Use as directed to administer insulin. 100 each 1   oxyCODONE (OXY IR/ROXICODONE) 5 MG immediate release tablet Take 1 tablet (5 mg total) by mouth every 6 (six) hours as needed for moderate pain or severe pain  (5 mg for moderate, 10 mg for severe). 15 tablet 0   collagenase (SANTYL) 250 UNIT/GM ointment Apply topically daily. (Patient not taking: Reported on 11/09/2021) 30 g 0   insulin glargine-yfgn (SEMGLEE) 100 UNIT/ML Pen Inject 5 Units into the skin daily. (Patient taking differently: Inject 1-2 Units into the skin 2 (two) times daily as needed (shortness of breath).) 3 mL 0   rosuvastatin (CRESTOR) 10 MG tablet Take 1 tablet (10 mg total) by mouth daily. (Patient not taking: Reported on 11/11/2021) 90 tablet 3   tamsulosin (FLOMAX) 0.4 MG CAPS capsule Take 1 capsule (0.4 mg total) by mouth daily after supper. (Patient not taking: Reported on 11/09/2021) 30 capsule 1   No current facility-administered medications for this visit.    REVIEW OF SYSTEMS:  [X] denotes positive finding, [ ] denotes negative finding Cardiac  Comments:  Chest pain or chest pressure:    Shortness of breath upon exertion:    Short of breath when lying flat:    Irregular heart rhythm:        Vascular    Pain in calf, thigh, or hip brought on by ambulation:    Pain in feet at night that wakes you up from your sleep:     Blood clot in your veins:    Leg swelling:  x       Pulmonary    Oxygen at home:    Productive cough:     Wheezing:         Neurologic    Sudden weakness in arms or legs:     Sudden numbness in arms or legs:     Sudden onset of difficulty speaking or slurred speech:    Temporary loss of vision in one eye:     Problems with dizziness:         Gastrointestinal    Blood in stool:     Vomited blood:         Genitourinary    Burning when urinating:     Blood in urine:        Psychiatric    Major depression:         Hematologic    Bleeding problems:    Problems with blood clotting too easily:        Skin    Rashes or ulcers: x       Constitutional    Fever or chills:      PHYSICAL EXAM: Vitals:   11/11/21 0842  BP: (!) 170/109  Pulse: (!) 105  Temp: 98.4 F (36.9 C)    TempSrc:  Temporal  SpO2: 99%  Weight: 124 lb 6.4 oz (56.4 kg)  Height: 5' 7" (1.702 m)    GENERAL: The patient is a well-nourished female, in no acute distress. The vital signs are documented above. CARDIOVASCULAR: 2+ radial and brachial pulses bilaterally.  Very small surface veins bilaterally. PULMONARY: There is good air exchange  MUSCULOSKELETAL: There are no major deformities or cyanosis. NEUROLOGIC: No focal weakness or paresthesias are detected. SKIN: Significant swelling of both lower extremities with the changes consistent with lymphedema in her lower extremities PSYCHIATRIC: The patient has a normal affect.  DATA:  I imaged her right arm veins with SonoSite ultrasound.  Her cephalic vein is very small throughout its course.  She does have a moderate size basilic vein with branching above the antecubital space with a larger branch going medially at the elbow.  MEDICAL ISSUES: Had a long discussion with the patient and her mother regarding access for hemodialysis.  I discussed the risk of catheter infection with prolonged use.  I discussed advantages and limitations of both AV fistula and AV graft.  She does appear to have adequate right arm basilic vein for attempted basilic vein fistula.  I Splane the first stage would be small incision at the antecubital space with brachiobasilic fistula.  We would then see her in the office in 4 to 6 weeks and assuming good maturation would return to the operating room for transposition.  I also explained the potential for not maturation.  If her vein appears to be inadequate at office follow-up would place right upper arm AV graft.  We have scheduled surgery for 11/24/2021 at  Hospital   Todd F. Early, MD FACS [facility] [place] Office Tel [phone] Pager [phone]  Note: Portions of this report may have been transcribed using voice recognition software.  Every effort has been made to ensure accuracy; however,  inadvertent computerized transcription errors may still be present.  

## 2021-11-11 NOTE — Progress Notes (Signed)
PCP - Thersa Salt, DO Cardiologist - n/a Endocrinology - Dr Loni Beckwith Nephrology - Dr Otelia Santee  Chest x-ray - 10/18/21 (2V) EKG - 10/06/21 Stress Test - n/a ECHO - 06/13/21 Cardiac Cath - n/a  ICD Pacemaker/Loop - n/s  Sleep Study -  n/a CPAP - none  THE NIGHT BEFORE SURGERY, take 1 unit Insulin Glargine PRN SOB.      THE MORNING OF SURGERY, take 1 unit Insulin Glargine PRN SOB.   If your blood sugar is less than 70 mg/dL, you will need to treat for low blood sugar: Treat a low blood sugar (less than 70 mg/dL) with  cup of clear juice (cranberry or apple), 4 glucose tablets, OR glucose gel. Recheck blood sugar in 15 minutes after treatment (to make sure it is greater than 70 mg/dL). If your blood sugar is not greater than 70 mg/dL on recheck, call [phone] for further instructions.  ERAS: Clear liquids til 8:30 AM DOS  Anesthesia review: Yes  STOP now taking any Aspirin (unless otherwise instructed by your surgeon), Aleve, Naproxen, Ibuprofen, Motrin, Advil, Goody's, BC's, all herbal medications, fish oil, and all vitamins.   Coronavirus Screening Do you have any of the following symptoms:  Cough yes/no: No Fever (>100.38F)  yes/no: No Runny nose yes/no: No Sore throat yes/no: No Difficulty breathing/shortness of breath  No  Have you traveled in the last 14 days and where? yes/no: No  Patient verbalized understanding of instructions that were given via phone.

## 2021-11-12 ENCOUNTER — Other Ambulatory Visit: Payer: Self-pay

## 2021-11-12 ENCOUNTER — Encounter (HOSPITAL_COMMUNITY)
Admission: RE | Disposition: A | Discharge status: Home / Self Care | Payer: Self-pay | Source: Home / Self Care | Attending: General Surgery

## 2021-11-12 ENCOUNTER — Ambulatory Visit (HOSPITAL_COMMUNITY)
Admission: RE | Admit: 2021-11-12 | Discharge: 2021-11-12 | Disposition: A | Discharge status: Home / Self Care | Payer: 59 | Attending: General Surgery | Admitting: General Surgery

## 2021-11-12 ENCOUNTER — Ambulatory Visit: Payer: 59 | Admitting: Family Medicine

## 2021-11-12 ENCOUNTER — Ambulatory Visit (HOSPITAL_COMMUNITY): Payer: 59 | Admitting: Physician Assistant

## 2021-11-12 ENCOUNTER — Encounter (HOSPITAL_COMMUNITY): Payer: Self-pay | Admitting: General Surgery

## 2021-11-12 ENCOUNTER — Ambulatory Visit (HOSPITAL_BASED_OUTPATIENT_CLINIC_OR_DEPARTMENT_OTHER): Payer: 59 | Admitting: Physician Assistant

## 2021-11-12 DIAGNOSIS — Z992 Dependence on renal dialysis: Secondary | ICD-10-CM | POA: Diagnosis not present

## 2021-11-12 DIAGNOSIS — N186 End stage renal disease: Secondary | ICD-10-CM | POA: Insufficient documentation

## 2021-11-12 DIAGNOSIS — N611 Abscess of the breast and nipple: Secondary | ICD-10-CM

## 2021-11-12 DIAGNOSIS — E1122 Type 2 diabetes mellitus with diabetic chronic kidney disease: Secondary | ICD-10-CM

## 2021-11-12 DIAGNOSIS — I120 Hypertensive chronic kidney disease with stage 5 chronic kidney disease or end stage renal disease: Secondary | ICD-10-CM

## 2021-11-12 DIAGNOSIS — Z794 Long term (current) use of insulin: Secondary | ICD-10-CM | POA: Insufficient documentation

## 2021-11-12 DIAGNOSIS — J45909 Unspecified asthma, uncomplicated: Secondary | ICD-10-CM | POA: Diagnosis not present

## 2021-11-12 HISTORY — DX: Essential (primary) hypertension: I10

## 2021-11-12 HISTORY — DX: Personal history of other medical treatment: Z92.89

## 2021-11-12 HISTORY — PX: BREAST CYST EXCISION: SHX579

## 2021-11-12 LAB — POCT I-STAT, CHEM 8
BUN: 21 mg/dL — ABNORMAL HIGH (ref 6–20)
Calcium, Ion: 1.12 mmol/L — ABNORMAL LOW (ref 1.15–1.40)
Chloride: 99 mmol/L (ref 98–111)
Creatinine, Ser: 2.8 mg/dL — ABNORMAL HIGH (ref 0.44–1.00)
Glucose, Bld: 201 mg/dL — ABNORMAL HIGH (ref 70–99)
HCT: 29 % — ABNORMAL LOW (ref 36.0–46.0)
Hemoglobin: 9.9 g/dL — ABNORMAL LOW (ref 12.0–15.0)
Potassium: 4.4 mmol/L (ref 3.5–5.1)
Sodium: 134 mmol/L — ABNORMAL LOW (ref 135–145)
TCO2: 25 mmol/L (ref 22–32)

## 2021-11-12 LAB — GLUCOSE, CAPILLARY
Glucose-Capillary: 204 mg/dL — ABNORMAL HIGH (ref 70–99)
Glucose-Capillary: 126 mg/dL — ABNORMAL HIGH (ref 70–99)

## 2021-11-12 LAB — POCT PREGNANCY, URINE: Preg Test, Ur: NEGATIVE

## 2021-11-12 SURGERY — EXCISION, CYST, BREAST
Anesthesia: General | Site: Breast | Laterality: Right

## 2021-11-12 MED ORDER — CHLORHEXIDINE GLUCONATE 0.12 % MT SOLN
15.0000 mL | OROMUCOSAL | Status: AC
  Administered 2021-11-12: 15 mL via OROMUCOSAL
  Filled 2021-11-12: qty 15

## 2021-11-12 MED ORDER — ACETAMINOPHEN 325 MG PO TABS: 650.0000 mg | ORAL_TABLET | ORAL | Status: DC | PRN

## 2021-11-12 MED ORDER — MIDAZOLAM HCL 2 MG/2ML IJ SOLN
INTRAMUSCULAR | Status: AC
  Filled 2021-11-12: qty 2

## 2021-11-12 MED ORDER — PROMETHAZINE HCL 25 MG/ML IJ SOLN: 6.2500 mg | INTRAMUSCULAR | Status: DC | PRN

## 2021-11-12 MED ORDER — ONDANSETRON HCL 4 MG/2ML IJ SOLN
INTRAMUSCULAR | Status: DC | PRN
  Administered 2021-11-12: 4 mg via INTRAVENOUS

## 2021-11-12 MED ORDER — OXYCODONE HCL 5 MG PO TABS: 5.0000 mg | ORAL_TABLET | ORAL | Status: DC | PRN

## 2021-11-12 MED ORDER — CEFAZOLIN SODIUM-DEXTROSE 2-4 GM/100ML-% IV SOLN
2.0000 g | INTRAVENOUS | Status: AC
  Administered 2021-11-12: 2 g via INTRAVENOUS
  Filled 2021-11-12: qty 100

## 2021-11-12 MED ORDER — FENTANYL CITRATE (PF) 250 MCG/5ML IJ SOLN
INTRAMUSCULAR | Status: AC
  Filled 2021-11-12: qty 5

## 2021-11-12 MED ORDER — FENTANYL CITRATE (PF) 100 MCG/2ML IJ SOLN: 25.0000 ug | INTRAMUSCULAR | Status: DC | PRN

## 2021-11-12 MED ORDER — CHLORHEXIDINE GLUCONATE CLOTH 2 % EX PADS: 6.0000 | MEDICATED_PAD | Freq: Once | CUTANEOUS | Status: DC

## 2021-11-12 MED ORDER — LIDOCAINE 2% (20 MG/ML) 5 ML SYRINGE
INTRAMUSCULAR | Status: DC | PRN
  Administered 2021-11-12: 40 mg via INTRAVENOUS

## 2021-11-12 MED ORDER — SODIUM CHLORIDE 0.9 % IV SOLN: INTRAVENOUS | Status: DC

## 2021-11-12 MED ORDER — SODIUM CHLORIDE 0.9 % IV SOLN
250.0000 mL | INTRAVENOUS | Status: DC | PRN
Start: 2021-11-12 — End: 2021-11-14

## 2021-11-12 MED ORDER — MIDAZOLAM HCL 5 MG/5ML IJ SOLN
INTRAMUSCULAR | Status: DC | PRN
  Administered 2021-11-12: 2 mg via INTRAVENOUS

## 2021-11-12 MED ORDER — ROCURONIUM BROMIDE 100 MG/10ML IV SOLN
INTRAVENOUS | Status: DC | PRN
  Administered 2021-11-12: 30 mg via INTRAVENOUS

## 2021-11-12 MED ORDER — OXYCODONE HCL 5 MG PO TABS: 5.0000 mg | ORAL_TABLET | Freq: Once | ORAL | Status: DC | PRN

## 2021-11-12 MED ORDER — OXYCODONE HCL 5 MG PO TABS: 5.0000 mg | ORAL_TABLET | Freq: Four times a day (QID) | ORAL | 0 refills | Status: DC | PRN

## 2021-11-12 MED ORDER — SODIUM CHLORIDE 0.9% FLUSH: 3.0000 mL | INTRAVENOUS | Status: DC | PRN

## 2021-11-12 MED ORDER — LIDOCAINE 2% (20 MG/ML) 5 ML SYRINGE
INTRAMUSCULAR | Status: AC
  Filled 2021-11-12: qty 5

## 2021-11-12 MED ORDER — ACETAMINOPHEN 650 MG RE SUPP: 650.0000 mg | RECTAL | Status: DC | PRN

## 2021-11-12 MED ORDER — DEXAMETHASONE SODIUM PHOSPHATE 10 MG/ML IJ SOLN
INTRAMUSCULAR | Status: AC
  Filled 2021-11-12: qty 1

## 2021-11-12 MED ORDER — BUPIVACAINE-EPINEPHRINE (PF) 0.25% -1:200000 IJ SOLN
INTRAMUSCULAR | Status: AC
  Filled 2021-11-12: qty 30

## 2021-11-12 MED ORDER — INSULIN ASPART 100 UNIT/ML IJ SOLN
0.0000 [IU] | INTRAMUSCULAR | Status: DC | PRN
  Administered 2021-11-12: 2 [IU] via SUBCUTANEOUS
  Filled 2021-11-12: qty 1

## 2021-11-12 MED ORDER — SUGAMMADEX SODIUM 200 MG/2ML IV SOLN
INTRAVENOUS | Status: DC | PRN
  Administered 2021-11-12: 224.8 mg via INTRAVENOUS

## 2021-11-12 MED ORDER — 0.9 % SODIUM CHLORIDE (POUR BTL) OPTIME
TOPICAL | Status: DC | PRN
  Administered 2021-11-12: 150 mL

## 2021-11-12 MED ORDER — OXYCODONE HCL 5 MG/5ML PO SOLN: 5.0000 mg | Freq: Once | ORAL | Status: DC | PRN

## 2021-11-12 MED ORDER — BACITRACIN ZINC 500 UNIT/GM EX OINT
TOPICAL_OINTMENT | CUTANEOUS | Status: AC
  Filled 2021-11-12: qty 28.35

## 2021-11-12 MED ORDER — DEXAMETHASONE SODIUM PHOSPHATE 4 MG/ML IJ SOLN
INTRAMUSCULAR | Status: DC | PRN
  Administered 2021-11-12: 4 mg via INTRAVENOUS

## 2021-11-12 MED ORDER — FENTANYL CITRATE (PF) 100 MCG/2ML IJ SOLN
INTRAMUSCULAR | Status: DC | PRN
Start: 2021-11-12 — End: 2021-11-12
  Administered 2021-11-12: 50 ug via INTRAVENOUS

## 2021-11-12 MED ORDER — ONDANSETRON HCL 4 MG/2ML IJ SOLN
INTRAMUSCULAR | Status: AC
  Filled 2021-11-12: qty 2

## 2021-11-12 MED ORDER — PHENYLEPHRINE HCL (PRESSORS) 10 MG/ML IV SOLN
INTRAVENOUS | Status: DC | PRN
  Administered 2021-11-12 (×2): 80 ug via INTRAVENOUS
  Administered 2021-11-12: 240 ug via INTRAVENOUS
  Administered 2021-11-12 (×2): 160 ug via INTRAVENOUS
  Administered 2021-11-12: 80 ug via INTRAVENOUS
  Administered 2021-11-12: 160 ug via INTRAVENOUS

## 2021-11-12 MED ORDER — PROPOFOL 10 MG/ML IV BOLUS
INTRAVENOUS | Status: DC | PRN
  Administered 2021-11-12: 150 mg via INTRAVENOUS

## 2021-11-12 MED ORDER — ACETAMINOPHEN 500 MG PO TABS
1000.0000 mg | ORAL_TABLET | ORAL | Status: AC
  Administered 2021-11-12: 1000 mg via ORAL
  Filled 2021-11-12: qty 2

## 2021-11-12 MED ORDER — ENSURE PRE-SURGERY PO LIQD: 296.0000 mL | Freq: Once | ORAL | Status: DC

## 2021-11-12 SURGICAL SUPPLY — 31 items
BAG COUNTER SPONGE SURGICOUNT (BAG) ×2 IMPLANT
BAG SPNG CNTER NS LX DISP (BAG) ×1
BINDER BREAST MEDIUM (GAUZE/BANDAGES/DRESSINGS) ×1 IMPLANT
BNDG GAUZE ELAST 4 BULKY (GAUZE/BANDAGES/DRESSINGS) IMPLANT
CANISTER SUCT 3000ML PPV (MISCELLANEOUS) ×2 IMPLANT
COVER SURGICAL LIGHT HANDLE (MISCELLANEOUS) ×2 IMPLANT
DRAPE LAPAROSCOPIC ABDOMINAL (DRAPES) ×1 IMPLANT
DRAPE LAPAROTOMY 100X72 PEDS (DRAPES) IMPLANT
DRSG PAD ABDOMINAL 8X10 ST (GAUZE/BANDAGES/DRESSINGS) ×1 IMPLANT
ELECT CAUTERY BLADE 6.4 (BLADE) ×1 IMPLANT
ELECT COATED BLADE 2.86 ST (ELECTRODE) ×1 IMPLANT
ELECT REM PT RETURN 9FT ADLT (ELECTROSURGICAL) ×2
ELECTRODE REM PT RTRN 9FT ADLT (ELECTROSURGICAL) ×1 IMPLANT
GAUZE PACKING IODOFORM 2 (PACKING) ×1 IMPLANT
GAUZE SPONGE 4X4 12PLY STRL (GAUZE/BANDAGES/DRESSINGS) ×1 IMPLANT
GLOVE BIO SURGEON STRL SZ7 (GLOVE) ×2 IMPLANT
GLOVE BIOGEL PI IND STRL 7.5 (GLOVE) ×1 IMPLANT
GLOVE BIOGEL PI INDICATOR 7.5 (GLOVE) ×1
GOWN STRL REUS W/ TWL LRG LVL3 (GOWN DISPOSABLE) ×2 IMPLANT
GOWN STRL REUS W/TWL LRG LVL3 (GOWN DISPOSABLE) ×6
KIT BASIN OR (CUSTOM PROCEDURE TRAY) ×2 IMPLANT
KIT TURNOVER KIT B (KITS) ×2 IMPLANT
NS IRRIG 1000ML POUR BTL (IV SOLUTION) ×2 IMPLANT
PACK GENERAL/GYN (CUSTOM PROCEDURE TRAY) ×2 IMPLANT
PAD ARMBOARD 7.5X6 YLW CONV (MISCELLANEOUS) ×2 IMPLANT
PENCIL SMOKE EVACUATOR (MISCELLANEOUS) ×2 IMPLANT
SUT ETHILON 3 0 PS 1 (SUTURE) ×1 IMPLANT
SWAB COLLECTION DEVICE MRSA (MISCELLANEOUS) ×1 IMPLANT
SWAB CULTURE ESWAB REG 1ML (MISCELLANEOUS) ×1 IMPLANT
TOWEL GREEN STERILE (TOWEL DISPOSABLE) ×2 IMPLANT
TOWEL GREEN STERILE FF (TOWEL DISPOSABLE) ×2 IMPLANT

## 2021-11-12 NOTE — Discharge Instructions (Signed)
May change outer dressing but leave packing in place and will return to office Monday to have this removed.   May shower.

## 2021-11-12 NOTE — Interval H&P Note (Signed)
History and Physical Interval Note:  11/12/2021 11:50 AM  [place]  has presented today for surgery, with the diagnosis of RIGHT BREAST ABSCESS.  The various methods of treatment have been discussed with the patient and family. After consideration of risks, benefits and other options for treatment, the patient has consented to  Procedure(s) with comments: RIGHT BREAST ABSCESS INCISION AND DRAINAGE (Right) - LMA as a surgical intervention.  The patient's history has been reviewed, patient examined, no change in status, stable for surgery.  I have reviewed the patient's chart and labs.  Questions were answered to the patient's satisfaction.     Rolm Bookbinder

## 2021-11-12 NOTE — Anesthesia Procedure Notes (Signed)
Procedure Name: LMA Insertion Date/Time: 11/12/2021 12:29 PM  Performed by: Ezequiel Kayser, CRNAPre-anesthesia Checklist: Patient identified, Emergency Drugs available, Suction available and Patient being monitored Patient Re-evaluated:Patient Re-evaluated prior to induction Oxygen Delivery Method: Circle System Utilized Preoxygenation: Pre-oxygenation with 100% oxygen Induction Type: IV induction Ventilation: Mask ventilation without difficulty LMA: LMA inserted LMA Size: 4.0 Number of attempts: 1 Airway Equipment and Method: Bite block Placement Confirmation: positive ETCO2 Tube secured with: Tape Dental Injury: Teeth and Oropharynx as per pre-operative assessment

## 2021-11-12 NOTE — Anesthesia Procedure Notes (Signed)
Procedure Name: Intubation Date/Time: 11/12/2021 12:47 PM  Performed by: Ezequiel Kayser, CRNAPre-anesthesia Checklist: Patient identified, Emergency Drugs available, Suction available and Patient being monitored Patient Re-evaluated:Patient Re-evaluated prior to induction Oxygen Delivery Method: Circle System Utilized Preoxygenation: Pre-oxygenation with 100% oxygen Induction Type: IV induction Ventilation: Mask ventilation without difficulty Laryngoscope Size: Mac and 3 Grade View: Grade II Tube type: Oral Tube size: 7.0 mm Number of attempts: 1 Airway Equipment and Method: Stylet and Oral airway Placement Confirmation: ETT inserted through vocal cords under direct vision, positive ETCO2 and breath sounds checked- equal and bilateral Secured at: 24 cm Tube secured with: Tape Dental Injury: Teeth and Oropharynx as per pre-operative assessment

## 2021-11-12 NOTE — Transfer of Care (Signed)
Immediate Anesthesia Transfer of Care Note  Patient: Lindsey Murillo  Procedure(s) Performed: RIGHT BREAST ABSCESS INCISION AND DRAINAGE (Right: Breast)  Patient Location: PACU  Anesthesia Type:General  Level of Consciousness: drowsy  Airway & Oxygen Therapy: Patient Spontanous Breathing and Patient connected to face mask oxygen  Post-op Assessment: Report given to RN and Post -op Vital signs reviewed and stable  Post vital signs: Reviewed and stable  Last Vitals:  Vitals Value Taken Time  BP 131/99 11/12/21 1312  Temp    Pulse 95 11/12/21 1314  Resp    SpO2 97 % 11/12/21 1314  Vitals shown include unvalidated device data.  Last Pain:  Vitals:   11/12/21 0947  TempSrc:   PainSc: 3       Patients Stated Pain Goal: 0 (15/61/53 7943)  Complications: No notable events documented.

## 2021-11-12 NOTE — Anesthesia Preprocedure Evaluation (Addendum)
Anesthesia Evaluation  Patient identified by MRN, date of birth, ID band Patient awake    Reviewed: Allergy & Precautions, NPO status , Patient's Chart, lab work & pertinent test results  Airway Mallampati: II  TM Distance: >3 FB Neck ROM: Full    Dental no notable dental hx.    Pulmonary asthma ,    Pulmonary exam normal        Cardiovascular hypertension, Normal cardiovascular exam     Neuro/Psych PSYCHIATRIC DISORDERS Depression  Neuromuscular disease    GI/Hepatic negative GI ROS, Neg liver ROS,   Endo/Other  diabetes, Insulin Dependent  Renal/GU ESRF and DialysisRenal diseaseOn HD M,W, F     Musculoskeletal negative musculoskeletal ROS (+)   Abdominal   Peds  Hematology  (+) Blood dyscrasia, anemia ,   Anesthesia Other Findings RIGHT BREAST ABSCESS  Reproductive/Obstetrics hcg negative                            Anesthesia Physical Anesthesia Plan  ASA: 3  Anesthesia Plan: General   Post-op Pain Management:    Induction: Intravenous  PONV Risk Score and Plan: 3 and Ondansetron, Dexamethasone, Midazolam and Treatment may vary due to age or medical condition  Airway Management Planned: LMA  Additional Equipment:   Intra-op Plan:   Post-operative Plan: Extubation in OR  Informed Consent: I have reviewed the patients History and Physical, chart, labs and discussed the procedure including the risks, benefits and alternatives for the proposed anesthesia with the patient or authorized representative who has indicated his/her understanding and acceptance.     Dental advisory given  Plan Discussed with: CRNA  Anesthesia Plan Comments:        Anesthesia Quick Evaluation

## 2021-11-12 NOTE — Op Note (Signed)
Preoperative diagnosis: Chronic right breast abscess Postoperative diagnosis: Same as above Procedure: Incision and drainage of right breast abscess with debridement of 5 x 5 cm area of necrotic tissue with cautery and a scalpel Surgeon: Dr. Serita Grammes Anesthesia: General Specimens: Cultures to microbiology Estimated blood loss: Minimal Complications: None Drains: None Sponge and count was correct completion Decision recovery stable condition  Indications:26 y.o. female who is seen today for right breast abscess. She has multiple medical issues. She was recently admitted for a neck abscess but also had a chronic right breast abscess. This had been going on for two months. She had [place] in hospital that shows diffuse skin thickening and edema of the soft tissue. No definite collection. Since then it has started draining purulence. Overall she is doing much better. She has HD via a hickman that sits right above this draining breast abscess.  We discussed draining this in the operating room.  Procedure: After informed consent was obtained she was taken to the operating room.  She was given antibiotics.  SCDs were placed.  She was placed under general anesthesia without complication.  She was prepped and draped with Betadine in the standard sterile surgical fashion.  Surgical timeout was then performed.  The Hickman catheter for dialysis was moved out of the way although this was sitting on top of the infection prior to beginning.  This is going to be a problem moving forward as there is now an open wound there.  We did dress this later so that it was out of the way.  The medial aspect of the breast at almost necessitated out already.  I removed an ellipse of skin that was necrotic tissue.  I then proceeded to enter into a cavity which took up most of the right breast.  There was another hole that had already been draining in the central medial breast.  There was purulence throughout.  I took  cultures.  I drained all the purulence and debrided all of the nonviable tissue.  I then irrigated copiously.  I placed iodoform.  I did closed the small central medial spot with a nylon suture.  Dressings were placed.  She tolerated this well and was transferred to recovery.

## 2021-11-12 NOTE — H&P (Signed)
  26 y.o. female who is seen today for right breast abscess. She has multiple medical issues. She was recently admitted for a neck abscess but also had a chronic right breast abscess. This had been going on for two months. She had [place] in hospital that shows diffuse skin thickening and edema of the soft tissue. No definite collection. Since then it has started draining purulence. Overall she is doing much better. She has HD via a hickman that sits right above this draining breast abscess.  Review of Systems: A complete review of systems was obtained from the patient. I have reviewed this information and discussed as appropriate with the patient. See HPI as well for other ROS.  Review of Systems  Gastrointestinal: Positive for abdominal pain.  All other systems reviewed and are negative.   Medical History: Past Medical History:  Diagnosis Date  Anemia  Chronic kidney disease  Diabetes mellitus without complication (CMS-[name])   History reviewed. No pertinent surgical history.   No Known Allergies  Current Outpatient Medications on File Prior to Visit  Medication Sig Dispense Refill  acetaminophen (TYLENOL) 325 MG tablet Take by mouth  collagenase (SANTYL) ointment Apply 1 Application topically once daily  cyanocobalamin (VITAMIN B12) 1000 MCG tablet Take by mouth  insulin GLARGINE-yfgn (SEMGLEE) injection (concentration 100 units/mL) INJECT 10 UNITS ONCE DAILY INTO THE SKIN  oxyCODONE (ROXICODONE) 5 MG immediate release tablet Take by mouth every 6 (six) hours  tamsulosin (FLOMAX) 0.4 mg capsule Take by mouth   Family History  Problem Relation Age of Onset  Diabetes Mother  High blood pressure (Hypertension) Father  Diabetes Father    Social History   Tobacco Use  Smoking Status Never  Smokeless Tobacco Never  Marital status: Single  Tobacco Use  Smoking status: Never  Smokeless tobacco: Never  Substance and Sexual Activity  Alcohol use: Not Currently  Drug use: Not  Currently   Objective:   Physical Exam Constitutional:  Appearance: Normal appearance.  Chest:  Breasts: Right: Mass and tenderness present.  Left: No mass, nipple discharge or skin change.  Comments: Right chronic breast abscess with draining medially and eschar Lymphadenopathy:  Upper Body:  Right upper body: No supraclavicular or axillary adenopathy.  Left upper body: No supraclavicular or axillary adenopathy.  Neurological:  Mental Status: She is alert.    Assessment and Plan:   Abscess of right breast  Incision and drainage of right breast abscess  I think that this needs to be drained. It clearly is an abscess and not completely drained. We discussed removing the eschar and leaving this open. Will schedule asap

## 2021-11-13 ENCOUNTER — Ambulatory Visit: Payer: 59 | Admitting: Family Medicine

## 2021-11-13 ENCOUNTER — Encounter (HOSPITAL_COMMUNITY): Payer: Self-pay | Admitting: General Surgery

## 2021-11-13 NOTE — Anesthesia Postprocedure Evaluation (Signed)
Anesthesia Post Note  Patient: [employer]  Procedure(s) Performed: RIGHT BREAST ABSCESS INCISION AND DRAINAGE (Right: Breast)     Patient location during evaluation: PACU Anesthesia Type: General Level of consciousness: awake and alert Pain management: pain level controlled Vital Signs Assessment: post-procedure vital signs reviewed and stable Respiratory status: spontaneous breathing, nonlabored ventilation, respiratory function stable and patient connected to nasal cannula oxygen Cardiovascular status: blood pressure returned to baseline and stable Postop Assessment: no apparent nausea or vomiting Anesthetic complications: no   No notable events documented.  Last Vitals:  Vitals:   11/12/21 0925 11/12/21 1315  BP: (!) 157/113   Pulse: (!) 105   Resp: 20 18  Temp: 36.6 C 36.8 C  SpO2: 98%     Last Pain:  Vitals:   11/12/21 1315  TempSrc:   PainSc: 0-No pain   Pain Goal: Patients Stated Pain Goal: 0 (11/12/21 0947)                 Sumner Kirchman L Stanlee Roehrig

## 2021-11-16 ENCOUNTER — Other Ambulatory Visit: Payer: Self-pay

## 2021-11-16 DIAGNOSIS — N186 End stage renal disease: Secondary | ICD-10-CM

## 2021-11-17 LAB — AEROBIC/ANAEROBIC CULTURE W GRAM STAIN (SURGICAL/DEEP WOUND)

## 2021-11-26 ENCOUNTER — Inpatient Hospital Stay: Payer: 59 | Admitting: Family Medicine

## 2021-12-04 ENCOUNTER — Encounter (HOSPITAL_COMMUNITY)
Admission: RE | Admit: 2021-12-04 | Discharge: 2021-12-04 | Disposition: A | Discharge status: Home / Self Care | Payer: 59 | Source: Ambulatory Visit | Attending: Vascular Surgery | Admitting: Vascular Surgery

## 2021-12-04 ENCOUNTER — Other Ambulatory Visit: Payer: Self-pay

## 2021-12-04 ENCOUNTER — Encounter (HOSPITAL_COMMUNITY): Payer: Self-pay

## 2021-12-04 VITALS — Ht 68.0 in | Wt 120.0 lb

## 2021-12-04 DIAGNOSIS — Z01818 Encounter for other preprocedural examination: Secondary | ICD-10-CM

## 2021-12-08 ENCOUNTER — Ambulatory Visit (HOSPITAL_COMMUNITY): Payer: 59 | Admitting: Certified Registered"

## 2021-12-08 ENCOUNTER — Encounter (HOSPITAL_COMMUNITY): Payer: Self-pay | Admitting: Vascular Surgery

## 2021-12-08 ENCOUNTER — Other Ambulatory Visit: Payer: Self-pay

## 2021-12-08 ENCOUNTER — Encounter (HOSPITAL_COMMUNITY)
Admission: RE | Disposition: A | Discharge status: Home / Self Care | Payer: Self-pay | Source: Home / Self Care | Attending: Vascular Surgery

## 2021-12-08 ENCOUNTER — Ambulatory Visit (HOSPITAL_COMMUNITY)
Admission: RE | Admit: 2021-12-08 | Discharge: 2021-12-08 | Disposition: A | Discharge status: Home / Self Care | Payer: 59 | Attending: Vascular Surgery | Admitting: Vascular Surgery

## 2021-12-08 ENCOUNTER — Ambulatory Visit (HOSPITAL_BASED_OUTPATIENT_CLINIC_OR_DEPARTMENT_OTHER): Payer: 59 | Admitting: Certified Registered"

## 2021-12-08 DIAGNOSIS — N186 End stage renal disease: Secondary | ICD-10-CM | POA: Diagnosis not present

## 2021-12-08 DIAGNOSIS — I120 Hypertensive chronic kidney disease with stage 5 chronic kidney disease or end stage renal disease: Secondary | ICD-10-CM

## 2021-12-08 DIAGNOSIS — E1022 Type 1 diabetes mellitus with diabetic chronic kidney disease: Secondary | ICD-10-CM

## 2021-12-08 DIAGNOSIS — E1043 Type 1 diabetes mellitus with diabetic autonomic (poly)neuropathy: Secondary | ICD-10-CM | POA: Diagnosis not present

## 2021-12-08 DIAGNOSIS — Z794 Long term (current) use of insulin: Secondary | ICD-10-CM | POA: Diagnosis not present

## 2021-12-08 DIAGNOSIS — K219 Gastro-esophageal reflux disease without esophagitis: Secondary | ICD-10-CM | POA: Diagnosis not present

## 2021-12-08 DIAGNOSIS — Z833 Family history of diabetes mellitus: Secondary | ICD-10-CM | POA: Diagnosis not present

## 2021-12-08 DIAGNOSIS — N185 Chronic kidney disease, stage 5: Secondary | ICD-10-CM

## 2021-12-08 DIAGNOSIS — Z992 Dependence on renal dialysis: Secondary | ICD-10-CM | POA: Diagnosis not present

## 2021-12-08 DIAGNOSIS — D631 Anemia in chronic kidney disease: Secondary | ICD-10-CM | POA: Diagnosis not present

## 2021-12-08 DIAGNOSIS — Z01818 Encounter for other preprocedural examination: Secondary | ICD-10-CM

## 2021-12-08 HISTORY — PX: AV FISTULA PLACEMENT: SHX1204

## 2021-12-08 LAB — POCT I-STAT, CHEM 8
BUN: 35 mg/dL — ABNORMAL HIGH (ref 6–20)
Calcium, Ion: 1.03 mmol/L — ABNORMAL LOW (ref 1.15–1.40)
Chloride: 104 mmol/L (ref 98–111)
Creatinine, Ser: 3.5 mg/dL — ABNORMAL HIGH (ref 0.44–1.00)
Glucose, Bld: 166 mg/dL — ABNORMAL HIGH (ref 70–99)
HCT: 25 % — ABNORMAL LOW (ref 36.0–46.0)
Hemoglobin: 8.5 g/dL — ABNORMAL LOW (ref 12.0–15.0)
Potassium: 4.6 mmol/L (ref 3.5–5.1)
Sodium: 135 mmol/L (ref 135–145)
TCO2: 25 mmol/L (ref 22–32)

## 2021-12-08 LAB — POCT PREGNANCY, URINE: Preg Test, Ur: NEGATIVE

## 2021-12-08 SURGERY — ARTERIOVENOUS (AV) FISTULA CREATION
Anesthesia: General | Site: Arm Upper | Laterality: Right

## 2021-12-08 MED ORDER — PROPOFOL 10 MG/ML IV BOLUS
INTRAVENOUS | Status: DC | PRN
  Administered 2021-12-08: 20 mg via INTRAVENOUS

## 2021-12-08 MED ORDER — HEPARIN 6000 UNIT IRRIGATION SOLUTION
Status: DC | PRN
  Administered 2021-12-08: 1

## 2021-12-08 MED ORDER — LIDOCAINE-EPINEPHRINE 0.5 %-1:200000 IJ SOLN
INTRAMUSCULAR | Status: AC
  Filled 2021-12-08: qty 1

## 2021-12-08 MED ORDER — OXYCODONE-ACETAMINOPHEN 5-325 MG PO TABS: 1.0000 | ORAL_TABLET | Freq: Four times a day (QID) | ORAL | 0 refills | Status: DC | PRN

## 2021-12-08 MED ORDER — ONDANSETRON HCL 4 MG/2ML IJ SOLN
INTRAMUSCULAR | Status: DC | PRN
  Administered 2021-12-08: 4 mg via INTRAVENOUS

## 2021-12-08 MED ORDER — 0.9 % SODIUM CHLORIDE (POUR BTL) OPTIME
TOPICAL | Status: DC | PRN
  Administered 2021-12-08: 1000 mL

## 2021-12-08 MED ORDER — LIDOCAINE-EPINEPHRINE 0.5 %-1:200000 IJ SOLN
INTRAMUSCULAR | Status: DC | PRN
  Administered 2021-12-08: 8 mL

## 2021-12-08 MED ORDER — MIDAZOLAM HCL 2 MG/2ML IJ SOLN
INTRAMUSCULAR | Status: AC
  Filled 2021-12-08: qty 2

## 2021-12-08 MED ORDER — PROPOFOL 10 MG/ML IV BOLUS
INTRAVENOUS | Status: AC
  Filled 2021-12-08: qty 20

## 2021-12-08 MED ORDER — CHLORHEXIDINE GLUCONATE 4 % EX LIQD: 60.0000 mL | Freq: Once | CUTANEOUS | Status: DC

## 2021-12-08 MED ORDER — SODIUM CHLORIDE 0.9 % IV SOLN: INTRAVENOUS | Status: DC | PRN

## 2021-12-08 MED ORDER — HEPARIN SODIUM (PORCINE) 1000 UNIT/ML IJ SOLN
INTRAMUSCULAR | Status: AC
  Filled 2021-12-08: qty 6

## 2021-12-08 MED ORDER — MIDAZOLAM HCL 5 MG/5ML IJ SOLN
INTRAMUSCULAR | Status: DC | PRN
  Administered 2021-12-08: 2 mg via INTRAVENOUS

## 2021-12-08 MED ORDER — PROPOFOL 500 MG/50ML IV EMUL
INTRAVENOUS | Status: DC | PRN
  Administered 2021-12-08: 25 ug/kg/min via INTRAVENOUS

## 2021-12-08 MED ORDER — CEFAZOLIN SODIUM-DEXTROSE 2-4 GM/100ML-% IV SOLN
2.0000 g | INTRAVENOUS | Status: AC
  Administered 2021-12-08: 2 g via INTRAVENOUS
  Filled 2021-12-08: qty 100

## 2021-12-08 MED ORDER — ONDANSETRON HCL 4 MG/2ML IJ SOLN: 4.0000 mg | Freq: Once | INTRAMUSCULAR | Status: DC | PRN

## 2021-12-08 MED ORDER — FENTANYL CITRATE PF 50 MCG/ML IJ SOSY: 25.0000 ug | PREFILLED_SYRINGE | INTRAMUSCULAR | Status: DC | PRN

## 2021-12-08 MED ORDER — SODIUM CHLORIDE 0.9 % IV SOLN: INTRAVENOUS | Status: DC

## 2021-12-08 SURGICAL SUPPLY — 41 items
ADH SKN CLS APL DERMABOND .7 (GAUZE/BANDAGES/DRESSINGS) ×1
ARMBAND PINK RESTRICT EXTREMIT (MISCELLANEOUS) ×2 IMPLANT
BAG HAMPER (MISCELLANEOUS) ×2 IMPLANT
CANNULA VESSEL 3MM 2 BLNT TIP (CANNULA) ×2 IMPLANT
CLIP LIGATING EXTRA MED SLVR (CLIP) ×2 IMPLANT
CLIP LIGATING EXTRA SM BLUE (MISCELLANEOUS) ×2 IMPLANT
COVER LIGHT HANDLE STERIS (MISCELLANEOUS) ×4 IMPLANT
COVER MAYO STAND XLG (MISCELLANEOUS) ×2 IMPLANT
COVER PROBE U/S 5X48 (MISCELLANEOUS) IMPLANT
DECANTER SPIKE VIAL GLASS SM (MISCELLANEOUS) ×2 IMPLANT
DERMABOND ADVANCED (GAUZE/BANDAGES/DRESSINGS) ×1
DERMABOND ADVANCED .7 DNX12 (GAUZE/BANDAGES/DRESSINGS) ×1 IMPLANT
ELECT REM PT RETURN 9FT ADLT (ELECTROSURGICAL) ×2
ELECTRODE REM PT RTRN 9FT ADLT (ELECTROSURGICAL) ×1 IMPLANT
GAUZE SPONGE 4X4 12PLY STRL (GAUZE/BANDAGES/DRESSINGS) ×2 IMPLANT
GLOVE BIOGEL PI IND STRL 7.0 (GLOVE) ×2 IMPLANT
GLOVE BIOGEL PI INDICATOR 7.0 (GLOVE) ×2
GLOVE SURG MICRO LTX SZ7.5 (GLOVE) ×2 IMPLANT
GOWN STRL REUS W/TWL LRG LVL3 (GOWN DISPOSABLE) ×6 IMPLANT
IV NS 500ML (IV SOLUTION) ×2
IV NS 500ML BAXH (IV SOLUTION) ×1 IMPLANT
KIT BLADEGUARD II DBL (SET/KITS/TRAYS/PACK) ×2 IMPLANT
KIT TURNOVER KIT A (KITS) ×2 IMPLANT
MANIFOLD NEPTUNE II (INSTRUMENTS) ×2 IMPLANT
MARKER SKIN DUAL TIP RULER LAB (MISCELLANEOUS) ×4 IMPLANT
NDL HYPO 18GX1.5 BLUNT FILL (NEEDLE) ×1 IMPLANT
NEEDLE HYPO 18GX1.5 BLUNT FILL (NEEDLE) ×2 IMPLANT
NS IRRIG 1000ML POUR BTL (IV SOLUTION) ×2 IMPLANT
PACK CV ACCESS (CUSTOM PROCEDURE TRAY) ×2 IMPLANT
PAD ARMBOARD 7.5X6 YLW CONV (MISCELLANEOUS) ×2 IMPLANT
SET BASIN LINEN APH (SET/KITS/TRAYS/PACK) ×2 IMPLANT
SOL PREP POV-IOD 4OZ 10% (MISCELLANEOUS) ×2 IMPLANT
SOL PREP PROV IODINE SCRUB 4OZ (MISCELLANEOUS) ×2 IMPLANT
SPONGE T-LAP 18X18 ~~LOC~~+RFID (SPONGE) ×2 IMPLANT
SUT PROLENE 6 0 CC (SUTURE) ×2 IMPLANT
SUT SILK 2 0 SH (SUTURE) IMPLANT
SUT VIC AB 3-0 SH 27 (SUTURE) ×2
SUT VIC AB 3-0 SH 27X BRD (SUTURE) ×1 IMPLANT
SYR 10ML LL (SYRINGE) ×2 IMPLANT
SYR 50ML LL SCALE MARK (SYRINGE) ×2 IMPLANT
UNDERPAD 30X36 HEAVY ABSORB (UNDERPADS AND DIAPERS) ×2 IMPLANT

## 2021-12-08 NOTE — Interval H&P Note (Signed)
History and Physical Interval Note:  12/08/2021 11:32 AM  [place]  has presented today for surgery, with the diagnosis of Chronic Kidney disese stage 4.  The various methods of treatment have been discussed with the patient and family. After consideration of risks, benefits and other options for treatment, the patient has consented to  Procedure(s): ARTERIOVENOUS  FISTULA CREATION VERSUS GRAFT (Right) as a surgical intervention.  The patient's history has been reviewed, patient examined, no change in status, stable for surgery.  I have reviewed the patient's chart and labs.  Questions were answered to the patient's satisfaction.     Curt Jews

## 2021-12-08 NOTE — Discharge Instructions (Signed)
[facility]  Discharge Instructions  AV Fistula or Graft Surgery for Dialysis Access  Please refer to the following instructions for your post-procedure care. Your surgeon or physician assistant will discuss any changes with you.  Activity  You may drive the day following your surgery, if you are comfortable and no longer taking prescription pain medication. Resume full activity as the soreness in your incision resolves.  Bathing/Showering  You may shower after you go home. Keep your incision dry for 48 hours. Do not soak in a bathtub, hot tub, or swim until the incision heals completely. You may not shower if you have a hemodialysis catheter.  Incision Care  Clean your incision with mild soap and water after 48 hours. Pat the area dry with a clean towel. You do not need a bandage unless otherwise instructed. Do not apply any ointments or creams to your incision. You may have skin glue on your incision. Do not peel it off. It will come off on its own in about one week. Your arm may swell a bit after surgery. To reduce swelling use pillows to elevate your arm so it is above your heart. Your doctor will tell you if you need to lightly wrap your arm with an ACE bandage.  Diet  Resume your normal diet. There are not special food restrictions following this procedure. In order to heal from your surgery, it is CRITICAL to get adequate nutrition. Your body requires vitamins, minerals, and protein. Vegetables are the best source of vitamins and minerals. Vegetables also provide the perfect balance of protein. Processed food has little nutritional value, so try to avoid this.  Medications  Resume taking all of your medications. If your incision is causing pain, you may take over-the counter pain relievers such as acetaminophen (Tylenol). If you were prescribed a stronger pain medication, please be aware these medications can cause nausea and constipation. Prevent  nausea by taking the medication with a snack or meal. Avoid constipation by drinking plenty of fluids and eating foods with high amount of fiber, such as fruits, vegetables, and grains.  Do not take Tylenol if you are taking prescription pain medications.  Follow up Your surgeon may want to see you in the office following your access surgery. If so, this will be arranged at the time of your surgery.  Please call us immediately for any of the following conditions:  Increased pain, redness, drainage (pus) from your incision site Fever of 101 degrees or higher Severe or worsening pain at your incision site Hand pain or numbness.  Reduce your risk of vascular disease:  Stop smoking. If you would like help, call [facility] at 1-800-QUIT-NOW 442-631-9416) or [place] at [place] your cholesterol Maintain a desired weight Control your diabetes Keep your blood pressure down  Dialysis  It will take several weeks to several months for your new dialysis access to be ready for use. Your surgeon will determine when it is okay to use it. Your nephrologist will continue to direct your dialysis. You can continue to use your Permcath until your new access is ready for use.   12/08/2021 ELADIA FRAME [id number] 12-07-1995  Surgeon(s): Jishnu Jenniges, Arvilla Meres, MD  Procedure(s): ARTERIOVENOUS  FISTULA CREATION VERSUS GRAFT   May stick graft immediately   May stick graft on designated area only:    Do not stick fistula for 12 weeks    If you have any questions, please call the office at [phone].

## 2021-12-08 NOTE — Transfer of Care (Signed)
Immediate Anesthesia Transfer of Care Note  Patient: Lindsey Murillo  Procedure(s) Performed: ARTERIOVENOUS  FISTULA CREATION VERSUS GRAFT (Right: Arm Upper)  Patient Location: PACU  Anesthesia Type:General  Level of Consciousness: awake, alert , oriented and patient cooperative  Airway & Oxygen Therapy: Patient Spontanous Breathing and Patient connected to nasal cannula oxygen  Post-op Assessment: Report given to RN, Post -op Vital signs reviewed and stable and Patient moving all extremities X 4  Post vital signs: Reviewed and stable  Last Vitals:  Vitals Value Taken Time  BP 120/81 12/08/21 1412  Temp 37.2 C 12/08/21 1412  Pulse 95 12/08/21 1413  Resp 19 12/08/21 1413  SpO2 98 % 12/08/21 1413  Vitals shown include unvalidated device data.  Last Pain:  Vitals:   12/08/21 1412  TempSrc:   PainSc: Asleep      Patients Stated Pain Goal: 8 (75/91/63 8466)  Complications: No notable events documented.

## 2021-12-08 NOTE — Op Note (Signed)
    OPERATIVE REPORT  DATE OF SURGERY: 12/08/2021  PATIENT: Lindsey Murillo, 26 y.o. female MRN: [id number]  DOB: 02/04/1996  PRE-OPERATIVE DIAGNOSIS: End-stage renal disease  POST-OPERATIVE DIAGNOSIS:  Same  PROCEDURE: Right first stage brachiobasilic fistula creation  SURGEON:  Curt Jews, M.D.  PHYSICIAN ASSISTANT: Vevelyn Royals, RNFA  The assistant was needed for exposure and to expedite the case  ANESTHESIA: Local with sedation  EBL: per anesthesia record  Total I/O In: 400 [I.V.:400] Out: 5 [Blood:5]  BLOOD ADMINISTERED: none  DRAINS: none  SPECIMEN: none  COUNTS CORRECT:  YES  PATIENT DISPOSITION:  PACU - hemodynamically stable  PROCEDURE DETAILS: The patient was taken operating placed supine position with area of the right arm was prepped draped usual sterile fashion.  Using SonoSite ultrasound the right basilic vein was identified and was of good caliber.  Using local anesthesia, incision was made from the level of the brachial artery and the basilic vein.  The basilic vein was mobilized and the larger of the branches at the antecubital space was chosen.  Other tributary branches were ligated and divided.  The vein was ligated distally and was opened and spatulated and was gently dilated and was a very good caliber.  The brachial artery was exposed with the same incision was also of good size and also of good caliber with minimal atherosclerotic change.  The artery was occluded proximally and distally and was opened with an 11 blade and sent longitudinally with Potts scissors.  The vein was cut to the appropriate length and spatulated and sewn end-to-side to the artery with a running 6-0 Prolene suture.  Clamps were removed and good thrill was noted in the vein.  The patient maintained a right radial pulse.  The wounds were irrigated with saline.  Hemostasis was obtained with electrocautery.  The wounds were closed with 3-0 Vicryl in the subcutaneous and subcuticular  tissue.  Sterile dressing was applied and the patient was transferred to the recovery room in stable condition   Lindsey Murillo, M.D., [facility] 12/08/2021 2:25 PM  Note: Portions of this report may have been transcribed using voice recognition software.  Every effort has been made to ensure accuracy; however, inadvertent computerized transcription errors may still be present.

## 2021-12-08 NOTE — Anesthesia Preprocedure Evaluation (Signed)
Anesthesia Evaluation  Patient identified by MRN, date of birth, ID band Patient awake    Reviewed: Allergy & Precautions, H&P , NPO status , Patient's Chart, lab work & pertinent test results, reviewed documented beta blocker date and time   Airway Mallampati: II  TM Distance: >3 FB Neck ROM: full    Dental no notable dental hx.    Pulmonary asthma ,    Pulmonary exam normal breath sounds clear to auscultation       Cardiovascular Exercise Tolerance: Good hypertension, negative cardio ROS   Rhythm:regular Rate:Normal     Neuro/Psych PSYCHIATRIC DISORDERS Depression  Neuromuscular disease    GI/Hepatic Neg liver ROS, GERD  Medicated,  Endo/Other  negative endocrine ROSdiabetes, Type 1  Renal/GU ESRF and CRFRenal disease  negative genitourinary   Musculoskeletal   Abdominal   Peds  Hematology  (+) Blood dyscrasia, anemia ,   Anesthesia Other Findings   Reproductive/Obstetrics negative OB ROS                             Anesthesia Physical Anesthesia Plan  ASA: 3  Anesthesia Plan: General   Post-op Pain Management:    Induction:   PONV Risk Score and Plan: Propofol infusion  Airway Management Planned:   Additional Equipment:   Intra-op Plan:   Post-operative Plan:   Informed Consent: I have reviewed the patients History and Physical, chart, labs and discussed the procedure including the risks, benefits and alternatives for the proposed anesthesia with the patient or authorized representative who has indicated his/her understanding and acceptance.     Dental Advisory Given  Plan Discussed with: CRNA  Anesthesia Plan Comments:         Anesthesia Quick Evaluation

## 2021-12-09 ENCOUNTER — Encounter (HOSPITAL_COMMUNITY): Payer: Self-pay | Admitting: Vascular Surgery

## 2021-12-09 NOTE — Anesthesia Postprocedure Evaluation (Signed)
Anesthesia Post Note  Patient: [employer]  Procedure(s) Performed: ARTERIOVENOUS  FISTULA CREATION VERSUS GRAFT (Right: Arm Upper)  Patient location during evaluation: Phase II Anesthesia Type: General Level of consciousness: awake Pain management: pain level controlled Vital Signs Assessment: post-procedure vital signs reviewed and stable Respiratory status: spontaneous breathing and respiratory function stable Cardiovascular status: blood pressure returned to baseline and stable Postop Assessment: no headache and no apparent nausea or vomiting Anesthetic complications: no Comments: Late entry   No notable events documented.   Last Vitals:  Vitals:   12/08/21 1430 12/08/21 1445  BP: (!) 123/91 (!) 124/92  Pulse: 100 (!) 102  Resp: 11 18  Temp:  36.9 C  SpO2: 95% 98%    Last Pain:  Vitals:   12/08/21 1508  TempSrc:   PainSc: 0-No pain                 Louann Sjogren

## 2021-12-10 NOTE — Telephone Encounter (Signed)
error 

## 2021-12-17 ENCOUNTER — Encounter: Payer: Self-pay | Admitting: Family Medicine

## 2021-12-17 ENCOUNTER — Ambulatory Visit (INDEPENDENT_AMBULATORY_CARE_PROVIDER_SITE_OTHER): Payer: 59 | Admitting: Family Medicine

## 2021-12-17 DIAGNOSIS — N611 Abscess of the breast and nipple: Secondary | ICD-10-CM

## 2021-12-17 DIAGNOSIS — E1065 Type 1 diabetes mellitus with hyperglycemia: Secondary | ICD-10-CM | POA: Diagnosis not present

## 2021-12-17 NOTE — Patient Instructions (Signed)
Check your blood sugars.  Please go back and see Dr. Dorris Fetch.  I will talk to Dr. Donne Hazel as well.  Follow up in 1 month.

## 2021-12-18 DIAGNOSIS — N611 Abscess of the breast and nipple: Secondary | ICD-10-CM | POA: Insufficient documentation

## 2021-12-18 DIAGNOSIS — N186 End stage renal disease: Secondary | ICD-10-CM | POA: Insufficient documentation

## 2021-12-18 NOTE — Progress Notes (Signed)
Subjective:  Patient ID: Lindsey Murillo, female    DOB: [birth year]  Age: 26 y.o. MRN: [id number]  CC: Chief Complaint  Patient presents with   Diabetes    Recent hospitalization for 20 days. Pt recently placed on Dialysis Monday Wednesday Friday     HPI:  26 year old female with an extensive past medical history including nephrotic syndrome/ESRD now on dialysis, uncontrolled diabetes, hypertension presents for follow-up.  Since patient's last visit with me she has been in the hospital for an extended period of time.  She initially had sepsis and was found to have a neck abscess which required surgery.  Renal function has worsened.  She is now on dialysis.  She has also had a breast abscess which required surgery as well in July.  Patient is non-compliant.  She is not checking her blood sugars.  She is not taking her insulin.  She has not followed up with endocrinology.  Patient states that she is doing okay.  Patient appears depressed.  Patient denies any pain associated with her right breast.    Patient Active Problem List   Diagnosis Date Noted   ESRD (end stage renal disease) ([place]) 12/18/2021   Abscess of right breast 12/18/2021   Physical deconditioning 10/07/2021   Protein-calorie malnutrition, severe ([place]) 10/02/2021   Depression 09/25/2021   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 chronic kidney disease ([place]) 09/25/2021   Normocytic anemia 06/26/2021   Peripheral edema 06/13/2021   Iron deficiency anemia 06/02/2021   Nephrotic syndrome 05/27/2021   Chronic pancreatitis ([place]) 08/22/2019   Pancreatic pseudocyst/cyst 08/22/2019   GERD (gastroesophageal reflux disease) 08/22/2019   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   Vitamin D deficiency 03/20/2019   Uncontrolled type 1 diabetes mellitus with hyperglycemia ([place]) 05/12/2015   Essential hypertension, benign 11/30/2012   Diabetic peripheral neuropathy ([place])    Asthma     Social Hx   Social History   Socioeconomic History    Marital status: Single    Spouse name: Not on file   Number of children: 0   Years of education: Not on file   Highest education level: High school graduate  Occupational History   Occupation: unemployed  Tobacco Use   Smoking status: Never   Smokeless tobacco: Never  Vaping Use   Vaping Use: Never used  Substance and Sexual Activity   Alcohol use: No   Drug use: No   Sexual activity: Not Currently    Birth control/protection: Abstinence, None  Other Topics Concern   Not on file  Social History Narrative   Not on file   Social Determinants of Health   Financial Resource Strain: Not on file  Food Insecurity: No Food Insecurity (07/02/2019)   Hunger Vital Sign    Worried About Running Out of Food in the Last Year: Never true    Ran Out of Food in the Last Year: Never true  Transportation Needs: No Transportation Needs (07/02/2019)   PRAPARE - Hydrologist (Medical): No    Lack of Transportation (Non-Medical): No  Physical Activity: Not on file  Stress: Not on file  Social Connections: Moderately Integrated (07/02/2019)   Social Connection and Isolation Panel [NHANES]    Frequency of Communication with Friends and Family: More than three times a week    Frequency of Social Gatherings with Friends and Family: More than three times a week    Attends Religious Services: 1 to 4 times per year    Active Member of Clubs or  Organizations: Yes    Attends Archivist Meetings: 1 to 4 times per year    Marital Status: Never married    Review of Systems  Constitutional:  Negative for fever.  Respiratory: Negative.     Objective:  BP (!) 156/92   Pulse 91   Temp 98.2 F (36.8 C)   Wt 116 lb 3.2 oz (52.7 kg)   SpO2 100%   BMI 17.67 kg/m      12/17/2021    2:30 PM 12/08/2021    2:45 PM 12/08/2021    2:30 PM  BP/Weight  Systolic BP 876 811 572  Diastolic BP 92 92 91  Wt. (Lbs) 116.2    BMI 17.67 kg/m2      Physical Exam Vitals and  nursing note reviewed. Exam conducted with a chaperone present.  Constitutional:      Comments: Thin female no acute distress.  Cardiovascular:     Rate and Rhythm: Normal rate and regular rhythm.  Pulmonary:     Effort: Pulmonary effort is normal.     Breath sounds: Normal breath sounds. No wheezing, rhonchi or rales.  Neurological:     Mental Status: She is alert.  Psychiatric:     Comments: Flat affect.  Depressed mood.   Right breast: A single suture was noted and was subsequently removed.  The majority of her breast is very hard and indurated.  She has a healing wound.  Lab Results  Component Value Date   WBC 13.6 (H) 10/22/2021   HGB 8.5 (L) 12/08/2021   HCT 25.0 (L) 12/08/2021   PLT 154 10/22/2021   GLUCOSE 166 (H) 12/08/2021   CHOL 175 09/24/2021   TRIG 250 (H) 09/24/2021   HDL 49 09/24/2021   LDLCALC 85 09/24/2021   ALT <5 10/12/2021   AST 12 (L) 10/12/2021   NA 135 12/08/2021   K 4.6 12/08/2021   CL 104 12/08/2021   CREATININE 3.50 (H) 12/08/2021   BUN 35 (H) 12/08/2021   CO2 28 10/22/2021   TSH 0.975 10/03/2021   INR 1.4 (H) 10/03/2021   HGBA1C 9.8 (H) 09/24/2021   MICROALBUR 1,195.2 (H) 06/13/2021     Assessment & Plan:   Problem List Items Addressed This Visit       Endocrine   Uncontrolled type 1 diabetes mellitus with hyperglycemia ([place])    Advised the need to be compliant with her insulin. Advised to follow-up with endocrinology.        Other   Abscess of right breast    Patient had previous surgery on the right breast.  She has not been seen recently by her surgeon.  Based on my examination, I am very concerned.  Her breast is very hard and indurated.  I am concerned that her abscess and infection has not resolved and that this may worsen and result in hospital admission.  I have called the surgery office and they are going to be in contact with the patient to get her seen by her surgeon.      Follow-up:  Return in about 1 month (around  01/17/2022).  [place]

## 2021-12-18 NOTE — Assessment & Plan Note (Signed)
Patient had previous surgery on the right breast.  She has not been seen recently by her surgeon.  Based on my examination, I am very concerned.  Her breast is very hard and indurated.  I am concerned that her abscess and infection has not resolved and that this may worsen and result in hospital admission.  I have called the surgery office and they are going to be in contact with the patient to get her seen by her surgeon.

## 2021-12-18 NOTE — Assessment & Plan Note (Signed)
Advised the need to be compliant with her insulin. Advised to follow-up with endocrinology.

## 2021-12-23 ENCOUNTER — Other Ambulatory Visit: Payer: Self-pay

## 2021-12-23 ENCOUNTER — Encounter (HOSPITAL_COMMUNITY): Payer: Self-pay | Admitting: Emergency Medicine

## 2021-12-23 ENCOUNTER — Emergency Department (HOSPITAL_COMMUNITY)
Admission: EM | Admit: 2021-12-23 | Discharge: 2021-12-23 | Disposition: A | Discharge status: Home / Self Care | Payer: 59 | Attending: Emergency Medicine | Admitting: Emergency Medicine

## 2021-12-23 DIAGNOSIS — E1022 Type 1 diabetes mellitus with diabetic chronic kidney disease: Secondary | ICD-10-CM | POA: Diagnosis not present

## 2021-12-23 DIAGNOSIS — D631 Anemia in chronic kidney disease: Secondary | ICD-10-CM | POA: Diagnosis not present

## 2021-12-23 DIAGNOSIS — D649 Anemia, unspecified: Secondary | ICD-10-CM | POA: Diagnosis present

## 2021-12-23 DIAGNOSIS — N186 End stage renal disease: Secondary | ICD-10-CM | POA: Insufficient documentation

## 2021-12-23 DIAGNOSIS — Z992 Dependence on renal dialysis: Secondary | ICD-10-CM | POA: Insufficient documentation

## 2021-12-23 DIAGNOSIS — I120 Hypertensive chronic kidney disease with stage 5 chronic kidney disease or end stage renal disease: Secondary | ICD-10-CM | POA: Diagnosis not present

## 2021-12-23 LAB — COMPREHENSIVE METABOLIC PANEL
ALT: 38 U/L (ref 0–44)
AST: 52 U/L — ABNORMAL HIGH (ref 15–41)
Albumin: 3.4 g/dL — ABNORMAL LOW (ref 3.5–5.0)
Alkaline Phosphatase: 444 U/L — ABNORMAL HIGH (ref 38–126)
Anion gap: 8 (ref 5–15)
BUN: 7 mg/dL (ref 6–20)
CO2: 28 mmol/L (ref 22–32)
Calcium: 8.7 mg/dL — ABNORMAL LOW (ref 8.9–10.3)
Chloride: 97 mmol/L — ABNORMAL LOW (ref 98–111)
Creatinine, Ser: 1.39 mg/dL — ABNORMAL HIGH (ref 0.44–1.00)
GFR, Estimated: 53 mL/min — ABNORMAL LOW (ref >60)
Glucose, Bld: 130 mg/dL — ABNORMAL HIGH (ref 70–99)
Potassium: 3.1 mmol/L — ABNORMAL LOW (ref 3.5–5.1)
Sodium: 133 mmol/L — ABNORMAL LOW (ref 135–145)
Total Bilirubin: 0.6 mg/dL (ref 0.3–1.2)
Total Protein: 8.2 g/dL — ABNORMAL HIGH (ref 6.5–8.1)

## 2021-12-23 LAB — CBC
HCT: 28.5 % — ABNORMAL LOW (ref 36.0–46.0)
Hemoglobin: 9.4 g/dL — ABNORMAL LOW (ref 12.0–15.0)
MCH: 28.9 pg (ref 26.0–34.0)
MCHC: 33 g/dL (ref 30.0–36.0)
MCV: 87.7 fL (ref 80.0–100.0)
Platelets: 130 10*3/uL — ABNORMAL LOW (ref 150–400)
RBC: 3.25 MIL/uL — ABNORMAL LOW (ref 3.87–5.11)
RDW: 15.4 % (ref 11.5–15.5)
WBC: 7.1 10*3/uL (ref 4.0–10.5)
nRBC: 0 % (ref 0.0–0.2)

## 2021-12-23 LAB — CBG MONITORING, ED: Glucose-Capillary: 160 mg/dL — ABNORMAL HIGH (ref 70–99)

## 2021-12-23 LAB — TYPE AND SCREEN
ABO/RH(D): A POS
Antibody Screen: NEGATIVE

## 2021-12-23 NOTE — ED Provider Notes (Signed)
[facility] Provider Note   CSN: [id number] Arrival date & time: 12/23/21  1613     History  Chief Complaint  Patient presents with   Abnormal Labs    Lindsey Murillo is a 26 y.o. female.  Pt is a 26 yo female with a pmhx significant for DM1, ESRD, nephrotic syndrome, anemia (requiring frequent blood transfusions).  Pt said she was told at dialysis today that her hgb was 6.7 and that she needed to come to the ED for a blood transfusion.  We don't have this hgb level in Epic.  Pt denies any sx other than feeling tired which she normally feels on dialysis days.  She did complete her dialysis session today.       Home Medications Prior to Admission medications   Medication Sig Start Date End Date Taking? Authorizing Provider  acetaminophen (TYLENOL) 325 MG tablet Take 650 mg by mouth every 6 (six) hours as needed for moderate pain (for pain).   Yes [provider]  B Complex-C-Folic Acid (DIALYVITE 053) 0.8 MG TABS Take by mouth. 11/16/21  Yes [provider]  Cyanocobalamin (B-12 PO) Take 1 capsule by mouth daily.   Yes [provider]  insulin glargine-yfgn (SEMGLEE) 100 UNIT/ML Pen Inject 5 Units into the skin daily. Patient taking differently: Inject 1-2 Units into the skin 2 (two) times daily as needed (shortness of breath). 10/22/21  Yes Eugenie Filler, MD  Methoxy PEG-Epoetin Beta (MIRCERA IJ) Mircera 12/11/21 12/10/22 Yes [provider]  metoprolol tartrate (LOPRESSOR) 50 MG tablet Take 50 mg by mouth 2 (two) times daily. Do not take [facility] dialysis 12/09/21  Yes [provider]  oxyCODONE-acetaminophen (PERCOCET) 5-325 MG tablet Take 1 tablet by mouth every 6 (six) hours as needed for severe pain. 12/08/21  Yes Early, Arvilla Meres, MD  sucroferric oxyhydroxide (VELPHORO) 500 MG chewable tablet Chew 500 mg by mouth 3 (three) times daily with meals. 11/16/21  Yes [provider]  blood glucose meter kit and  supplies KIT Dispense based on patient and insurance preference. Use up to four times daily as directed. 05/26/21   Coral Spikes, DO  Continuous Blood Gluc Receiver (FREESTYLE LIBRE 2 READER) DEVI As directed 06/02/21   Cassandria Anger, MD  Continuous Blood Gluc Sensor (FREESTYLE LIBRE 2 SENSOR) MISC 1 Piece by Does not apply route every 14 (fourteen) days. 06/02/21   Cassandria Anger, MD  Insulin Pen Needle 31G X 4 MM MISC Use as directed to administer insulin. 10/22/21   Eugenie Filler, MD  tamsulosin (FLOMAX) 0.4 MG CAPS capsule Take 1 capsule (0.4 mg total) by mouth daily after supper. Patient not taking: Reported on 12/23/2021 10/22/21   Eugenie Filler, MD      Allergies    Patient has no known allergies.    Review of Systems   Review of Systems  Neurological:  Positive for weakness.  All other systems reviewed and are negative.   Physical Exam Updated Vital Signs BP 116/75   Pulse 100   Temp 98 F (36.7 C) (Oral)   Resp 17   Ht 5' 8"  (1.727 m)   Wt 53 kg   SpO2 100%   BMI 17.77 kg/m  Physical Exam Vitals and nursing note reviewed.  Constitutional:      Appearance: Normal appearance.  HENT:     Head: Normocephalic and atraumatic.     Right Ear: External ear normal.     Left Ear:  External ear normal.     Nose: Nose normal.     Mouth/Throat:     Mouth: Mucous membranes are moist.     Pharynx: Oropharynx is clear.  Eyes:     Extraocular Movements: Extraocular movements intact.     Conjunctiva/sclera: Conjunctivae normal.     Pupils: Pupils are equal, round, and reactive to light.  Cardiovascular:     Rate and Rhythm: Normal rate and regular rhythm.     Pulses: Normal pulses.     Heart sounds: Normal heart sounds.  Pulmonary:     Effort: Pulmonary effort is normal.     Breath sounds: Normal breath sounds.  Abdominal:     General: Abdomen is flat. Bowel sounds are normal.     Palpations: Abdomen is soft.  Musculoskeletal:        General: Normal  range of motion.     Cervical back: Normal range of motion and neck supple.     Comments: Right arm +AVF with thrill  Skin:    General: Skin is warm.     Capillary Refill: Capillary refill takes less than 2 seconds.  Neurological:     General: No focal deficit present.     Mental Status: She is alert and oriented to person, place, and time.  Psychiatric:        Mood and Affect: Mood normal.        Behavior: Behavior normal.     ED Results / Procedures / Treatments   Labs (all labs ordered are listed, but only abnormal results are displayed) Labs Reviewed  COMPREHENSIVE METABOLIC PANEL - Abnormal; Notable for the following components:      Result Value   Sodium 133 (*)    Potassium 3.1 (*)    Chloride 97 (*)    Glucose, Bld 130 (*)    Creatinine, Ser 1.39 (*)    Calcium 8.7 (*)    Total Protein 8.2 (*)    Albumin 3.4 (*)    AST 52 (*)    Alkaline Phosphatase 444 (*)    GFR, Estimated 53 (*)    All other components within normal limits  CBC - Abnormal; Notable for the following components:   RBC 3.25 (*)    Hemoglobin 9.4 (*)    HCT 28.5 (*)    Platelets 130 (*)    All other components within normal limits  TYPE AND SCREEN    EKG None  Radiology No results found.  Procedures Procedures    Medications Ordered in ED Medications - No data to display  ED Course/ Medical Decision Making/ A&P                           Medical Decision Making Amount and/or Complexity of Data Reviewed Labs: ordered.   This patient presents to the ED for concern of weakness, this involves an extensive number of treatment options, and is a complaint that carries with it a high risk of complications and morbidity.  The differential diagnosis includes anemia, electrolyte abn   Co morbidities that complicate the patient evaluation  DM1, ESRD, nephrotic syndrome, anemia (requiring frequent blood transfusions)   Additional history obtained:  Additional history obtained from  epic chart review External records from outside source obtained and reviewed including mom   Lab Tests:  I Ordered, and personally interpreted labs.  The pertinent results include:  cbc with hgb 9.4 (last hgb 8.5 2 weeks ago); cmp with k  3.1 (just finished dialysis), cr 1.39  Cardiac Monitoring:  The patient was maintained on a cardiac monitor.  I personally viewed and interpreted the cardiac monitored which showed an underlying rhythm of: nsr   Medicines ordered and prescription drug management:  I have reviewed the patients home medicines and have made adjustments as needed   Problem List / ED Course:  Anemia:  hgb today is better than usual.  I don't think she needs a transfusion today.  Pt is stable for d/c.  She is to return if worse.  F/u with pcp.   Social Determinants of Health:  Lives at home   Dispostion:  After consideration of the diagnostic results and the patients response to treatment, I feel that the patent would benefit from discharge with outpatient f/u.          Final Clinical Impression(s) / ED Diagnoses Final diagnoses:  Chronic anemia  ESRD on hemodialysis [facility])    Rx / DC Orders ED Discharge Orders     None         Isla Pence, MD 12/23/21 [phone]

## 2021-12-23 NOTE — ED Triage Notes (Signed)
Pt presents after dialysis treatment for Hbg of 6.7, per mom last transfusion was in June 2023.

## 2021-12-25 ENCOUNTER — Ambulatory Visit: Payer: Self-pay | Admitting: Family Medicine

## 2021-12-25 ENCOUNTER — Other Ambulatory Visit: Payer: Self-pay | Admitting: *Deleted

## 2021-12-25 DIAGNOSIS — N186 End stage renal disease: Secondary | ICD-10-CM

## 2021-12-28 ENCOUNTER — Ambulatory Visit: Payer: 59 | Admitting: "Endocrinology

## 2021-12-29 ENCOUNTER — Other Ambulatory Visit: Payer: Self-pay | Admitting: Family Medicine

## 2021-12-29 DIAGNOSIS — R269 Unspecified abnormalities of gait and mobility: Secondary | ICD-10-CM

## 2022-01-06 ENCOUNTER — Encounter (HOSPITAL_COMMUNITY): Payer: Self-pay | Admitting: Hematology

## 2022-01-06 ENCOUNTER — Other Ambulatory Visit: Payer: Self-pay

## 2022-01-06 ENCOUNTER — Encounter: Payer: Self-pay | Admitting: Vascular Surgery

## 2022-01-06 ENCOUNTER — Ambulatory Visit (INDEPENDENT_AMBULATORY_CARE_PROVIDER_SITE_OTHER): Payer: 59

## 2022-01-06 ENCOUNTER — Ambulatory Visit (INDEPENDENT_AMBULATORY_CARE_PROVIDER_SITE_OTHER): Payer: 59 | Admitting: Vascular Surgery

## 2022-01-06 VITALS — BP 138/87 | HR 90 | Temp 98.1°F | Ht 69.0 in | Wt 125.8 lb

## 2022-01-06 DIAGNOSIS — N186 End stage renal disease: Secondary | ICD-10-CM | POA: Diagnosis not present

## 2022-01-06 DIAGNOSIS — Z992 Dependence on renal dialysis: Secondary | ICD-10-CM

## 2022-01-06 NOTE — Progress Notes (Signed)
138/87 

## 2022-01-06 NOTE — H&P (View-Only) (Signed)
[facility] [place]  Patient name: Lindsey Murillo MRN: [id number] DOB: [birth year] Sex: female  REASON FOR VISIT: Follow-up right for stage brachiobasilic fistula creation on 12/08/2021  HPI: Lindsey Murillo is a 26 y.o. female here today for follow-up.  She is here today with her mother.  She is having successful hemodialysis via her right IJ catheter.  She has dialysis at [facility] in [place].  She has dialysis on Monday Wednesday and Friday.  She has no steal symptoms in her right hand.  Current Outpatient Medications  Medication Sig Dispense Refill   acetaminophen (TYLENOL) 325 MG tablet Take 650 mg by mouth every 6 (six) hours as needed for moderate pain (for pain).     B Complex-C-Folic Acid (DIALYVITE 552) 0.8 MG TABS Take by mouth.     blood glucose meter kit and supplies KIT Dispense based on patient and insurance preference. Use up to four times daily as directed. 1 each 0   Continuous Blood Gluc Receiver (FREESTYLE LIBRE 2 READER) DEVI As directed 1 each 0   Continuous Blood Gluc Sensor (FREESTYLE LIBRE 2 SENSOR) MISC 1 Piece by Does not apply route every 14 (fourteen) days. 2 each 3   Cyanocobalamin (B-12 PO) Take 1 capsule by mouth daily.     insulin glargine-yfgn (SEMGLEE) 100 UNIT/ML Pen Inject 5 Units into the skin daily. (Patient taking differently: Inject 1-2 Units into the skin 2 (two) times daily as needed (shortness of breath).) 3 mL 0   Insulin Pen Needle 31G X 4 MM MISC Use as directed to administer insulin. 100 each 1   Methoxy PEG-Epoetin Beta (MIRCERA IJ) Mircera     metoprolol tartrate (LOPRESSOR) 50 MG tablet Take 50 mg by mouth 2 (two) times daily. Do not take befire dialysis     oxyCODONE-acetaminophen (PERCOCET) 5-325 MG tablet Take 1 tablet by mouth every 6 (six) hours as needed for severe pain. 8 tablet 0   sucroferric oxyhydroxide (VELPHORO) 500 MG chewable tablet Chew 500 mg by mouth 3 (three)  times daily with meals.     tamsulosin (FLOMAX) 0.4 MG CAPS capsule Take 1 capsule (0.4 mg total) by mouth daily after supper. 30 capsule 1   No current facility-administered medications for this visit.     PHYSICAL EXAM: Vitals:   01/06/22 0939  BP: 138/87  Pulse: 90  Temp: 98.1 F (36.7 C)  TempSrc: Temporal  SpO2: 100%  Weight: 125 lb 12.8 oz (57.1 kg)  Height: _0  (1.753 m)    GENERAL: The patient is a well-nourished female, in no acute distress. The vital signs are documented above. 2+ right radial pulse.  Excellent thrill in her right upper arm brachiobasilic fistula.  She does have a slight stitch abscess at the distal portion of her antecubital incision  Duplex shows excellent Wal[place]ga Hudman maturation of the fistula with excellent size and also no evidence of stenosis at the anastomosis  MEDICAL ISSUES: Wally Shevchenko result from for stage of her brachiobasilic fistula.  I have recommended second stage transposition.  I again reviewed the procedure with the patient and her mother.  Explained this will be done as an outpatient at [facility].  We would then wait approximately 6 weeks before use of her fistula assuming she has adequate maturation.  We will schedule her surgery for 01/12/2022 at [facility]   [name], MD [facility] Tel [phone]  Note: Portions of this report  may have been transcribed using voice recognition software.  Every effort has been made to ensure accuracy; however, inadvertent computerized transcription errors may still be present.

## 2022-01-06 NOTE — Progress Notes (Signed)
[facility] [place]  Patient name: Lindsey Murillo MRN: [id number] DOB: [birth year] Sex: female  REASON FOR VISIT: Follow-up right for stage brachiobasilic fistula creation on 12/08/2021  HPI: Lindsey Murillo is a 26 y.o. female here today for follow-up.  She is here today with her mother.  She is having successful hemodialysis via her right IJ catheter.  She has dialysis at [facility] in [place].  She has dialysis on Monday Wednesday and Friday.  She has no steal symptoms in her right hand.  Current Outpatient Medications  Medication Sig Dispense Refill   acetaminophen (TYLENOL) 325 MG tablet Take 650 mg by mouth every 6 (six) hours as needed for moderate pain (for pain).     B Complex-C-Folic Acid (DIALYVITE 552) 0.8 MG TABS Take by mouth.     blood glucose meter kit and supplies KIT Dispense based on patient and insurance preference. Use up to four times daily as directed. 1 each 0   Continuous Blood Gluc Receiver (FREESTYLE LIBRE 2 READER) DEVI As directed 1 each 0   Continuous Blood Gluc Sensor (FREESTYLE LIBRE 2 SENSOR) MISC 1 Piece by Does not apply route every 14 (fourteen) days. 2 each 3   Cyanocobalamin (B-12 PO) Take 1 capsule by mouth daily.     insulin glargine-yfgn (SEMGLEE) 100 UNIT/ML Pen Inject 5 Units into the skin daily. (Patient taking differently: Inject 1-2 Units into the skin 2 (two) times daily as needed (shortness of breath).) 3 mL 0   Insulin Pen Needle 31G X 4 MM MISC Use as directed to administer insulin. 100 each 1   Methoxy PEG-Epoetin Beta (MIRCERA IJ) Mircera     metoprolol tartrate (LOPRESSOR) 50 MG tablet Take 50 mg by mouth 2 (two) times daily. Do not take befire dialysis     oxyCODONE-acetaminophen (PERCOCET) 5-325 MG tablet Take 1 tablet by mouth every 6 (six) hours as needed for severe pain. 8 tablet 0   sucroferric oxyhydroxide (VELPHORO) 500 MG chewable tablet Chew 500 mg by mouth 3 (three)  times daily with meals.     tamsulosin (FLOMAX) 0.4 MG CAPS capsule Take 1 capsule (0.4 mg total) by mouth daily after supper. 30 capsule 1   No current facility-administered medications for this visit.     PHYSICAL EXAM: Vitals:   01/06/22 0939  BP: 138/87  Pulse: 90  Temp: 98.1 F (36.7 C)  TempSrc: Temporal  SpO2: 100%  Weight: 125 lb 12.8 oz (57.1 kg)  Height: _0  (1.753 m)    GENERAL: The patient is a well-nourished female, in no acute distress. The vital signs are documented above. 2+ right radial pulse.  Excellent thrill in her right upper arm brachiobasilic fistula.  She does have a slight stitch abscess at the distal portion of her antecubital incision  Duplex shows excellent Uzziah Rigg maturation of the fistula with excellent size and also no evidence of stenosis at the anastomosis  MEDICAL ISSUES: Aksh Swart result from for stage of her brachiobasilic fistula.  I have recommended second stage transposition.  I again reviewed the procedure with the patient and her mother.  Explained this will be done as an outpatient at [facility].  We would then wait approximately 6 weeks before use of her fistula assuming she has adequate maturation.  We will schedule her surgery for 01/12/2022 at [facility]   [name], MD [facility] Tel [phone]  Note: Portions of this report  may have been transcribed using voice recognition software.  Every effort has been made to ensure accuracy; however, inadvertent computerized transcription errors may still be present.

## 2022-01-07 ENCOUNTER — Encounter (HOSPITAL_COMMUNITY)
Admission: RE | Admit: 2022-01-07 | Discharge: 2022-01-07 | Disposition: A | Discharge status: Home / Self Care | Payer: 59 | Source: Ambulatory Visit | Attending: Vascular Surgery | Admitting: Vascular Surgery

## 2022-01-07 DIAGNOSIS — Z01818 Encounter for other preprocedural examination: Secondary | ICD-10-CM

## 2022-01-08 DIAGNOSIS — E1040 Type 1 diabetes mellitus with diabetic neuropathy, unspecified: Secondary | ICD-10-CM | POA: Diagnosis not present

## 2022-01-08 DIAGNOSIS — N186 End stage renal disease: Secondary | ICD-10-CM | POA: Diagnosis not present

## 2022-01-08 DIAGNOSIS — D631 Anemia in chronic kidney disease: Secondary | ICD-10-CM | POA: Diagnosis not present

## 2022-01-08 DIAGNOSIS — N2581 Secondary hyperparathyroidism of renal origin: Secondary | ICD-10-CM | POA: Diagnosis not present

## 2022-01-08 DIAGNOSIS — N032 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: Secondary | ICD-10-CM | POA: Diagnosis not present

## 2022-01-08 DIAGNOSIS — Z992 Dependence on renal dialysis: Secondary | ICD-10-CM | POA: Diagnosis not present

## 2022-01-08 MED ORDER — ORAL CARE MOUTH RINSE: 15.0000 mL | Freq: Once | OROMUCOSAL | Status: DC

## 2022-01-08 MED ORDER — LACTATED RINGERS IV SOLN: INTRAVENOUS | Status: DC

## 2022-01-08 MED ORDER — CHLORHEXIDINE GLUCONATE 0.12 % MT SOLN: 15.0000 mL | Freq: Once | OROMUCOSAL | Status: DC

## 2022-01-11 DIAGNOSIS — D631 Anemia in chronic kidney disease: Secondary | ICD-10-CM | POA: Diagnosis not present

## 2022-01-11 DIAGNOSIS — Z992 Dependence on renal dialysis: Secondary | ICD-10-CM | POA: Diagnosis not present

## 2022-01-11 DIAGNOSIS — N186 End stage renal disease: Secondary | ICD-10-CM | POA: Diagnosis not present

## 2022-01-11 DIAGNOSIS — N2581 Secondary hyperparathyroidism of renal origin: Secondary | ICD-10-CM | POA: Diagnosis not present

## 2022-01-11 DIAGNOSIS — E1040 Type 1 diabetes mellitus with diabetic neuropathy, unspecified: Secondary | ICD-10-CM | POA: Diagnosis not present

## 2022-01-12 ENCOUNTER — Ambulatory Visit (HOSPITAL_BASED_OUTPATIENT_CLINIC_OR_DEPARTMENT_OTHER): Payer: Medicare Other | Admitting: Anesthesiology

## 2022-01-12 ENCOUNTER — Encounter (HOSPITAL_COMMUNITY): Payer: Self-pay | Admitting: Vascular Surgery

## 2022-01-12 ENCOUNTER — Other Ambulatory Visit: Payer: Self-pay

## 2022-01-12 ENCOUNTER — Encounter (HOSPITAL_COMMUNITY): Payer: Self-pay | Admitting: Hematology

## 2022-01-12 ENCOUNTER — Ambulatory Visit (HOSPITAL_COMMUNITY)
Admission: RE | Admit: 2022-01-12 | Discharge: 2022-01-12 | Disposition: A | Discharge status: Home / Self Care | Payer: Medicare Other | Attending: Vascular Surgery | Admitting: Vascular Surgery

## 2022-01-12 ENCOUNTER — Ambulatory Visit (HOSPITAL_COMMUNITY): Payer: Medicare Other | Admitting: Anesthesiology

## 2022-01-12 ENCOUNTER — Encounter (HOSPITAL_COMMUNITY)
Admission: RE | Disposition: A | Discharge status: Home / Self Care | Payer: Self-pay | Source: Home / Self Care | Attending: Vascular Surgery

## 2022-01-12 DIAGNOSIS — E1022 Type 1 diabetes mellitus with diabetic chronic kidney disease: Secondary | ICD-10-CM | POA: Insufficient documentation

## 2022-01-12 DIAGNOSIS — N186 End stage renal disease: Secondary | ICD-10-CM | POA: Insufficient documentation

## 2022-01-12 DIAGNOSIS — N185 Chronic kidney disease, stage 5: Secondary | ICD-10-CM | POA: Diagnosis not present

## 2022-01-12 DIAGNOSIS — I120 Hypertensive chronic kidney disease with stage 5 chronic kidney disease or end stage renal disease: Secondary | ICD-10-CM

## 2022-01-12 DIAGNOSIS — Z794 Long term (current) use of insulin: Secondary | ICD-10-CM

## 2022-01-12 DIAGNOSIS — Z992 Dependence on renal dialysis: Secondary | ICD-10-CM | POA: Insufficient documentation

## 2022-01-12 DIAGNOSIS — Z01818 Encounter for other preprocedural examination: Secondary | ICD-10-CM

## 2022-01-12 HISTORY — PX: BASCILIC VEIN TRANSPOSITION: SHX5742

## 2022-01-12 LAB — POCT I-STAT, CHEM 8
BUN: 11 mg/dL (ref 6–20)
Calcium, Ion: 1.15 mmol/L (ref 1.15–1.40)
Chloride: 101 mmol/L (ref 98–111)
Creatinine, Ser: 3.1 mg/dL — ABNORMAL HIGH (ref 0.44–1.00)
Glucose, Bld: 282 mg/dL — ABNORMAL HIGH (ref 70–99)
HCT: 28 % — ABNORMAL LOW (ref 36.0–46.0)
Hemoglobin: 9.5 g/dL — ABNORMAL LOW (ref 12.0–15.0)
Potassium: 3.8 mmol/L (ref 3.5–5.1)
Sodium: 136 mmol/L (ref 135–145)
TCO2: 24 mmol/L (ref 22–32)

## 2022-01-12 LAB — POCT PREGNANCY, URINE: Preg Test, Ur: NEGATIVE

## 2022-01-12 LAB — GLUCOSE, CAPILLARY: Glucose-Capillary: 284 mg/dL — ABNORMAL HIGH (ref 70–99)

## 2022-01-12 SURGERY — TRANSPOSITION, VEIN, BASILIC
Anesthesia: General | Site: Arm Upper | Laterality: Right

## 2022-01-12 MED ORDER — FENTANYL CITRATE (PF) 100 MCG/2ML IJ SOLN
INTRAMUSCULAR | Status: AC
  Filled 2022-01-12: qty 2

## 2022-01-12 MED ORDER — DEXAMETHASONE SODIUM PHOSPHATE 4 MG/ML IJ SOLN
INTRAMUSCULAR | Status: DC | PRN
  Administered 2022-01-12: 4 mg via INTRAVENOUS

## 2022-01-12 MED ORDER — CHLORHEXIDINE GLUCONATE 4 % EX LIQD: 60.0000 mL | Freq: Once | CUTANEOUS | Status: DC

## 2022-01-12 MED ORDER — LIDOCAINE-EPINEPHRINE 0.5 %-1:200000 IJ SOLN
INTRAMUSCULAR | Status: AC
  Filled 2022-01-12: qty 1

## 2022-01-12 MED ORDER — SODIUM CHLORIDE 0.9 % IV SOLN: INTRAVENOUS | Status: DC

## 2022-01-12 MED ORDER — HYDROMORPHONE HCL 1 MG/ML IJ SOLN: 0.2500 mg | INTRAMUSCULAR | Status: DC | PRN

## 2022-01-12 MED ORDER — ONDANSETRON HCL 4 MG/2ML IJ SOLN: 4.0000 mg | Freq: Once | INTRAMUSCULAR | Status: DC | PRN

## 2022-01-12 MED ORDER — FENTANYL CITRATE (PF) 100 MCG/2ML IJ SOLN
INTRAMUSCULAR | Status: DC | PRN
  Administered 2022-01-12 (×2): 50 ug via INTRAVENOUS

## 2022-01-12 MED ORDER — ONDANSETRON HCL 4 MG/2ML IJ SOLN
INTRAMUSCULAR | Status: AC
  Filled 2022-01-12: qty 2

## 2022-01-12 MED ORDER — SUGAMMADEX SODIUM 200 MG/2ML IV SOLN
INTRAVENOUS | Status: DC | PRN
  Administered 2022-01-12: 200 mg via INTRAVENOUS

## 2022-01-12 MED ORDER — DEXAMETHASONE SODIUM PHOSPHATE 10 MG/ML IJ SOLN
INTRAMUSCULAR | Status: AC
  Filled 2022-01-12: qty 1

## 2022-01-12 MED ORDER — ROCURONIUM BROMIDE 10 MG/ML (PF) SYRINGE
PREFILLED_SYRINGE | INTRAVENOUS | Status: DC | PRN
  Administered 2022-01-12: 10 mg via INTRAVENOUS
  Administered 2022-01-12: 30 mg via INTRAVENOUS

## 2022-01-12 MED ORDER — MIDAZOLAM HCL 2 MG/2ML IJ SOLN
INTRAMUSCULAR | Status: AC
  Filled 2022-01-12: qty 2

## 2022-01-12 MED ORDER — LIDOCAINE HCL (CARDIAC) PF 100 MG/5ML IV SOSY
PREFILLED_SYRINGE | INTRAVENOUS | Status: DC | PRN
  Administered 2022-01-12: 60 mg via INTRAVENOUS

## 2022-01-12 MED ORDER — 0.9 % SODIUM CHLORIDE (POUR BTL) OPTIME
TOPICAL | Status: DC | PRN
  Administered 2022-01-12: 1000 mL

## 2022-01-12 MED ORDER — LIDOCAINE HCL (PF) 2 % IJ SOLN
INTRAMUSCULAR | Status: AC
  Filled 2022-01-12: qty 5

## 2022-01-12 MED ORDER — ONDANSETRON HCL 4 MG/2ML IJ SOLN
INTRAMUSCULAR | Status: DC | PRN
  Administered 2022-01-12: 4 mg via INTRAVENOUS

## 2022-01-12 MED ORDER — MIDAZOLAM HCL 5 MG/5ML IJ SOLN
INTRAMUSCULAR | Status: DC | PRN
  Administered 2022-01-12: 1 mg via INTRAVENOUS

## 2022-01-12 MED ORDER — CEFAZOLIN SODIUM-DEXTROSE 2-4 GM/100ML-% IV SOLN
2.0000 g | INTRAVENOUS | Status: AC
  Administered 2022-01-12: 2 g via INTRAVENOUS
  Filled 2022-01-12: qty 100

## 2022-01-12 MED ORDER — PROPOFOL 10 MG/ML IV BOLUS
INTRAVENOUS | Status: DC | PRN
  Administered 2022-01-12: 150 mg via INTRAVENOUS

## 2022-01-12 MED ORDER — HEPARIN SODIUM (PORCINE) 1000 UNIT/ML IJ SOLN
INTRAMUSCULAR | Status: AC
  Filled 2022-01-12: qty 6

## 2022-01-12 MED ORDER — HEPARIN 6000 UNIT IRRIGATION SOLUTION
Status: DC | PRN
  Administered 2022-01-12: 1

## 2022-01-12 MED ORDER — OXYCODONE-ACETAMINOPHEN 5-325 MG PO TABS: 1.0000 | ORAL_TABLET | Freq: Four times a day (QID) | ORAL | 0 refills | Status: DC | PRN

## 2022-01-12 MED ORDER — PHENYLEPHRINE 80 MCG/ML (10ML) SYRINGE FOR IV PUSH (FOR BLOOD PRESSURE SUPPORT)
PREFILLED_SYRINGE | INTRAVENOUS | Status: DC | PRN
  Administered 2022-01-12 (×5): 40 ug via INTRAVENOUS

## 2022-01-12 MED ORDER — PHENYLEPHRINE 80 MCG/ML (10ML) SYRINGE FOR IV PUSH (FOR BLOOD PRESSURE SUPPORT)
PREFILLED_SYRINGE | INTRAVENOUS | Status: AC
  Filled 2022-01-12: qty 10

## 2022-01-12 MED ORDER — SUCCINYLCHOLINE CHLORIDE 200 MG/10ML IV SOSY
PREFILLED_SYRINGE | INTRAVENOUS | Status: AC
  Filled 2022-01-12: qty 10

## 2022-01-12 MED ORDER — SUCCINYLCHOLINE CHLORIDE 200 MG/10ML IV SOSY
PREFILLED_SYRINGE | INTRAVENOUS | Status: DC | PRN
  Administered 2022-01-12: 100 mg via INTRAVENOUS

## 2022-01-12 MED ORDER — ROCURONIUM BROMIDE 10 MG/ML (PF) SYRINGE
PREFILLED_SYRINGE | INTRAVENOUS | Status: AC
  Filled 2022-01-12: qty 10

## 2022-01-12 SURGICAL SUPPLY — 44 items
ADH SKN CLS APL DERMABOND .7 (GAUZE/BANDAGES/DRESSINGS) ×2
ARMBAND PINK RESTRICT EXTREMIT (MISCELLANEOUS) ×1 IMPLANT
BAG HAMPER (MISCELLANEOUS) ×1 IMPLANT
CANNULA VESSEL 3MM 2 BLNT TIP (CANNULA) ×1 IMPLANT
CLIP LIGATING EXTRA MED SLVR (CLIP) ×1 IMPLANT
CLIP LIGATING EXTRA SM BLUE (MISCELLANEOUS) ×1 IMPLANT
COVER LIGHT HANDLE STERIS (MISCELLANEOUS) ×1 IMPLANT
COVER MAYO STAND XLG (MISCELLANEOUS) ×1 IMPLANT
DERMABOND ADVANCED .7 DNX12 (GAUZE/BANDAGES/DRESSINGS) ×1 IMPLANT
ELECT REM PT RETURN 9FT ADLT (ELECTROSURGICAL) ×1
ELECTRODE REM PT RTRN 9FT ADLT (ELECTROSURGICAL) ×1 IMPLANT
GAUZE SPONGE 4X4 12PLY STRL (GAUZE/BANDAGES/DRESSINGS) ×1 IMPLANT
GLOVE BIOGEL M 7.0 STRL (GLOVE) IMPLANT
GLOVE BIOGEL PI IND STRL 7.0 (GLOVE) ×2 IMPLANT
GLOVE SURG MICRO LTX SZ7.5 (GLOVE) ×1 IMPLANT
GLOVE SURG SS PI 7.0 STRL IVOR (GLOVE) IMPLANT
GOWN STRL REUS W/TWL LRG LVL3 (GOWN DISPOSABLE) ×3 IMPLANT
IV NS 500ML (IV SOLUTION) ×1
IV NS 500ML BAXH (IV SOLUTION) ×1 IMPLANT
KIT BLADEGUARD II DBL (SET/KITS/TRAYS/PACK) ×1 IMPLANT
KIT TURNOVER KIT A (KITS) ×1 IMPLANT
MANIFOLD NEPTUNE II (INSTRUMENTS) ×1 IMPLANT
MARKER SKIN DUAL TIP RULER LAB (MISCELLANEOUS) ×2 IMPLANT
NDL HYPO 18GX1.5 BLUNT FILL (NEEDLE) ×2 IMPLANT
NEEDLE HYPO 18GX1.5 BLUNT FILL (NEEDLE) ×2 IMPLANT
NS IRRIG 1000ML POUR BTL (IV SOLUTION) ×1 IMPLANT
PACK CV ACCESS (CUSTOM PROCEDURE TRAY) ×1 IMPLANT
PAD ARMBOARD 7.5X6 YLW CONV (MISCELLANEOUS) ×1 IMPLANT
POSITIONER HEAD 8X9X4 ADT (SOFTGOODS) ×1 IMPLANT
SET BASIN LINEN APH (SET/KITS/TRAYS/PACK) ×1 IMPLANT
SOL PREP POV-IOD 4OZ 10% (MISCELLANEOUS) ×1 IMPLANT
SOL PREP PROV IODINE SCRUB 4OZ (MISCELLANEOUS) ×1 IMPLANT
SPONGE T-LAP 18X18 ~~LOC~~+RFID (SPONGE) ×1 IMPLANT
SUT MNCRL AB 4-0 PS2 18 (SUTURE) IMPLANT
SUT PROLENE 6 0 CC (SUTURE) ×1 IMPLANT
SUT SILK 2 0 SH (SUTURE) IMPLANT
SUT SILK 3 0 (SUTURE) ×1
SUT SILK 3-0 18XBRD TIE 12 (SUTURE) IMPLANT
SUT VIC AB 3-0 SH 27 (SUTURE) ×2
SUT VIC AB 3-0 SH 27X BRD (SUTURE) ×1 IMPLANT
SYR 10ML LL (SYRINGE) ×1 IMPLANT
SYR BULB IRRIG 60ML STRL (SYRINGE) ×1 IMPLANT
UNDERPAD 30X36 HEAVY ABSORB (UNDERPADS AND DIAPERS) ×1 IMPLANT
WATER STERILE IRR 1000ML POUR (IV SOLUTION) ×1 IMPLANT

## 2022-01-12 NOTE — Transfer of Care (Signed)
Immediate Anesthesia Transfer of Care Note  Patient: Lindsey Murillo  Procedure(s) Performed: RIGHT ARM SECOND STAGE BASILIC VEIN TRANSPOSITION (Right: Arm Upper)  Patient Location: PACU  Anesthesia Type:General  Level of Consciousness: drowsy  Airway & Oxygen Therapy: Patient Spontanous Breathing and Patient connected to nasal cannula oxygen  Post-op Assessment: Report given to RN and Post -op Vital signs reviewed and stable  Post vital signs: Reviewed and stable  Last Vitals:  Vitals Value Taken Time  BP    Temp    Pulse    Resp    SpO2      Last Pain:  Vitals:   01/12/22 0719  TempSrc: Oral  PainSc: 4       Patients Stated Pain Goal: 6 (35/57/32 2025)  Complications: No notable events documented.

## 2022-01-12 NOTE — Interval H&P Note (Signed)
History and Physical Interval Note:  01/12/2022 7:17 AM  Lindsey Murillo  has presented today for surgery, with the diagnosis of ESRD.  The various methods of treatment have been discussed with the patient and family. After consideration of risks, benefits and other options for treatment, the patient has consented to  Procedure(s): RIGHT ARM SECOND STAGE BASILIC VEIN TRANSPOSITION (Right) as a surgical intervention.  The patient's history has been reviewed, patient examined, no change in status, stable for surgery.  I have reviewed the patient's chart and labs.  Questions were answered to the patient's satisfaction.     Curt Jews

## 2022-01-12 NOTE — Anesthesia Procedure Notes (Signed)
Procedure Name: Intubation Date/Time: 01/12/2022 7:41 AM  Performed by: Lieutenant Diego, CRNAPre-anesthesia Checklist: Patient identified, Emergency Drugs available, Suction available and Patient being monitored Patient Re-evaluated:Patient Re-evaluated prior to induction Oxygen Delivery Method: Circle system utilized Preoxygenation: Pre-oxygenation with 100% oxygen Induction Type: IV induction Ventilation: Mask ventilation without difficulty Laryngoscope Size: Miller and 2 Grade View: Grade I Tube type: Oral Tube size: 7.0 mm Number of attempts: 1 Airway Equipment and Method: Stylet Placement Confirmation: ETT inserted through vocal cords under direct vision, positive ETCO2 and breath sounds checked- equal and bilateral Secured at: 22 cm Tube secured with: Tape Dental Injury: Teeth and Oropharynx as per pre-operative assessment

## 2022-01-12 NOTE — Anesthesia Preprocedure Evaluation (Signed)
Anesthesia Evaluation  Patient identified by MRN, date of birth, ID band Patient awake    Reviewed: Allergy & Precautions, NPO status , Patient's Chart, lab work & pertinent test results, reviewed documented beta blocker date and time   Airway Mallampati: II  TM Distance: >3 FB Neck ROM: Full    Dental  (+) Dental Advisory Given, Chipped   Pulmonary asthma ,    Pulmonary exam normal breath sounds clear to auscultation       Cardiovascular Exercise Tolerance: Good hypertension (did not take metoprolol today), Pt. on home beta blockers and Pt. on medications Normal cardiovascular exam Rhythm:Regular Rate:Normal     Neuro/Psych PSYCHIATRIC DISORDERS Depression  Neuromuscular disease    GI/Hepatic Neg liver ROS, GERD  Controlled,  Endo/Other  diabetes, Well Controlled, Type 1, Insulin Dependent  Renal/GU ESRF and DialysisRenal disease  negative genitourinary   Musculoskeletal negative musculoskeletal ROS (+)   Abdominal   Peds negative pediatric ROS (+)  Hematology  (+) Blood dyscrasia, anemia ,   Anesthesia Other Findings   Reproductive/Obstetrics negative OB ROS                           Anesthesia Physical Anesthesia Plan  ASA: 3  Anesthesia Plan: General   Post-op Pain Management: Dilaudid IV   Induction: Intravenous  PONV Risk Score and Plan: 4 or greater and Ondansetron and Metaclopromide  Airway Management Planned: Oral ETT  Additional Equipment:   Intra-op Plan:   Post-operative Plan: Extubation in OR  Informed Consent: I have reviewed the patients History and Physical, chart, labs and discussed the procedure including the risks, benefits and alternatives for the proposed anesthesia with the patient or authorized representative who has indicated his/her understanding and acceptance.     Dental advisory given  Plan Discussed with: CRNA and Surgeon  Anesthesia Plan  Comments:        Anesthesia Quick Evaluation

## 2022-01-12 NOTE — Op Note (Signed)
    OPERATIVE REPORT  DATE OF SURGERY: 01/12/2022  PATIENT: Lindsey Murillo, 26 y.o. female MRN: [id number]  DOB: [birth year]  PRE-OPERATIVE DIAGNOSIS: End-stage renal disease  POST-OPERATIVE DIAGNOSIS:  Same  PROCEDURE: Right second stage basilic vein transposition  SURGEON:  Curt Jews, M.D.  PHYSICIAN ASSISTANT: Fulton Mole, RNFA  The assistant was needed for exposure and to expedite the case  ANESTHESIA: General  EBL: per anesthesia record  Total I/O In: 600 [I.V.:500; IV Piggyback:100] Out: 3 [Blood:3]  BLOOD ADMINISTERED: none  DRAINS: none  SPECIMEN: none  COUNTS CORRECT:  YES  PATIENT DISPOSITION:  PACU - hemodynamically stable  PROCEDURE DETAILS: Patient was taken operating placed supine position where the area of the right arm and right axilla were prepped and draped in usual sterile fashion.  Incision was made through the prior antecubital incision.  There was some stitch abscess from the prior Vicryl closure.  This was debrided.  Separate incision incisions were made on the upper arm over the basilic vein including a small incision at the axilla.  The patient had a very nice caliber basilic vein.  Tributary branches were ligated with 3-0 and 4-0 silk sutures and divided.  The vein was mobilized circumferentially from the antecubital space to the axilla.  The vein was occluded near the brachial artery anastomosis and the vein was transected.  The vein was brought out of its native tunnel and was marked to reduce risk of twisting.  A new subcutaneous tunnel was created from the antecubital space to the axilla and the vein was brought back through this tunnel.  The vein was sewn into and to itself with a running 6-0 Prolene suture near the brachial artery anastomosis.  Clamps were removed and excellent thrill was noted.  The wounds were irrigated with saline.  Hemostasis was obtained with electrocautery.  Wounds were closed with 3-0 Vicryl in the subcutaneous tissue.   The skin was closed with 4-0 Monocryl suture.  Dermabond was applied and the patient was transferred to the recovery room in stable condition   [name], M.D., [facility] 01/12/2022 10:06 AM  Note: Portions of this report may have been transcribed using voice recognition software.  Every effort has been made to ensure accuracy; however, inadvertent computerized transcription errors may still be present.

## 2022-01-12 NOTE — Discharge Instructions (Signed)
Vascular and Vein Specialists of [facility]  Discharge Instructions  AV Fistula or Graft Surgery for Dialysis Access  Please refer to the following instructions for your post-procedure care. Your surgeon or physician assistant will discuss any changes with you.  Activity  You may drive the day following your surgery, if you are comfortable and no longer taking prescription pain medication. Resume full activity as the soreness in your incision resolves.  Bathing/Showering  You may shower after you go home. Keep your incision dry for 48 hours. Do not soak in a bathtub, hot tub, or swim until the incision heals completely. You may not shower if you have a hemodialysis catheter.  Incision Care  Clean your incision with mild soap and water after 48 hours. Pat the area dry with a clean towel. You do not need a bandage unless otherwise instructed. Do not apply any ointments or creams to your incision. You may have skin glue on your incision. Do not peel it off. It will come off on its own in about one week. Your arm may swell a bit after surgery. To reduce swelling use pillows to elevate your arm so it is above your heart. Your doctor will tell you if you need to lightly wrap your arm with an ACE bandage.  Diet  Resume your normal diet. There are not special food restrictions following this procedure. In order to heal from your surgery, it is CRITICAL to get adequate nutrition. Your body requires vitamins, minerals, and protein. Vegetables are the best source of vitamins and minerals. Vegetables also provide the perfect balance of protein. Processed food has little nutritional value, so try to avoid this.  Medications  Resume taking all of your medications. If your incision is causing pain, you may take over-the counter pain relievers such as acetaminophen (Tylenol). If you were prescribed a stronger pain medication, please be aware these medications can cause nausea and constipation. Prevent  nausea by taking the medication with a snack or meal. Avoid constipation by drinking plenty of fluids and eating foods with high amount of fiber, such as fruits, vegetables, and grains.  Do not take Tylenol if you are taking prescription pain medications.  Follow up Your surgeon may want to see you in the office following your access surgery. If so, this will be arranged at the time of your surgery.  Please call us immediately for any of the following conditions:  Increased pain, redness, drainage (pus) from your incision site Fever of 101 degrees or higher Severe or worsening pain at your incision site Hand pain or numbness.  Reduce your risk of vascular disease:  Stop smoking. If you would like help, call [facility] at 1-800-QUIT-NOW 641-633-5279) or [place] at [place] your cholesterol Maintain a desired weight Control your diabetes Keep your blood pressure down  Dialysis  It will take several weeks to several months for your new dialysis access to be ready for use. Your surgeon will determine when it is okay to use it. Your nephrologist will continue to direct your dialysis. You can continue to use your Permcath until your new access is ready for use.   01/12/2022 ISMA TIETJE [id number] March 28, 1996  Surgeon(s): Fairy Ashlock, Arvilla Meres, MD  Procedure(s): RIGHT ARM SECOND STAGE BASILIC VEIN TRANSPOSITION   May stick graft immediately   May stick graft on designated area only:    Do not stick fistula for 6 weeks    If you have any questions, please call the office at  [phone].  

## 2022-01-12 NOTE — Anesthesia Postprocedure Evaluation (Signed)
Anesthesia Post Note  Patient: [employer]  Procedure(s) Performed: RIGHT ARM SECOND STAGE BASILIC VEIN TRANSPOSITION (Right: Arm Upper)  Patient location during evaluation: Phase II Anesthesia Type: General Level of consciousness: awake and alert and oriented Pain management: pain level controlled Vital Signs Assessment: post-procedure vital signs reviewed and stable Respiratory status: spontaneous breathing, nonlabored ventilation and respiratory function stable Cardiovascular status: blood pressure returned to baseline and stable Postop Assessment: no apparent nausea or vomiting Anesthetic complications: no   No notable events documented.   Last Vitals:  Vitals:   01/12/22 1045 01/12/22 1114  BP: (!) 145/92 (!) 158/98  Pulse: 91 98  Resp: 15 18  Temp:  36.7 C  SpO2: 100% 100%    Last Pain:  Vitals:   01/12/22 1114  TempSrc: Oral  PainSc: 6                  Javonnie Illescas C Charlie Seda

## 2022-01-13 DIAGNOSIS — D631 Anemia in chronic kidney disease: Secondary | ICD-10-CM | POA: Diagnosis not present

## 2022-01-13 DIAGNOSIS — Z992 Dependence on renal dialysis: Secondary | ICD-10-CM | POA: Diagnosis not present

## 2022-01-13 DIAGNOSIS — N2581 Secondary hyperparathyroidism of renal origin: Secondary | ICD-10-CM | POA: Diagnosis not present

## 2022-01-13 DIAGNOSIS — E1040 Type 1 diabetes mellitus with diabetic neuropathy, unspecified: Secondary | ICD-10-CM | POA: Diagnosis not present

## 2022-01-13 DIAGNOSIS — N186 End stage renal disease: Secondary | ICD-10-CM | POA: Diagnosis not present

## 2022-01-15 ENCOUNTER — Encounter (HOSPITAL_COMMUNITY): Payer: Self-pay | Admitting: Hematology

## 2022-01-19 ENCOUNTER — Encounter (HOSPITAL_COMMUNITY): Payer: Self-pay | Admitting: Vascular Surgery

## 2022-01-19 ENCOUNTER — Encounter: Payer: Self-pay | Admitting: Gastroenterology

## 2022-01-19 ENCOUNTER — Ambulatory Visit (INDEPENDENT_AMBULATORY_CARE_PROVIDER_SITE_OTHER): Payer: 59 | Admitting: Gastroenterology

## 2022-01-19 ENCOUNTER — Ambulatory Visit (INDEPENDENT_AMBULATORY_CARE_PROVIDER_SITE_OTHER): Payer: 59 | Admitting: Family Medicine

## 2022-01-19 VITALS — BP 137/94 | HR 90 | Temp 97.8°F | Ht 67.0 in | Wt 122.6 lb

## 2022-01-19 DIAGNOSIS — K219 Gastro-esophageal reflux disease without esophagitis: Secondary | ICD-10-CM

## 2022-01-19 DIAGNOSIS — D509 Iron deficiency anemia, unspecified: Secondary | ICD-10-CM

## 2022-01-19 DIAGNOSIS — R1012 Left upper quadrant pain: Secondary | ICD-10-CM | POA: Insufficient documentation

## 2022-01-19 DIAGNOSIS — N611 Abscess of the breast and nipple: Secondary | ICD-10-CM

## 2022-01-19 DIAGNOSIS — K863 Pseudocyst of pancreas: Secondary | ICD-10-CM

## 2022-01-19 DIAGNOSIS — R1319 Other dysphagia: Secondary | ICD-10-CM | POA: Insufficient documentation

## 2022-01-19 DIAGNOSIS — I10 Essential (primary) hypertension: Secondary | ICD-10-CM

## 2022-01-19 DIAGNOSIS — K861 Other chronic pancreatitis: Secondary | ICD-10-CM

## 2022-01-19 DIAGNOSIS — E1065 Type 1 diabetes mellitus with hyperglycemia: Secondary | ICD-10-CM

## 2022-01-19 DIAGNOSIS — K862 Cyst of pancreas: Secondary | ICD-10-CM

## 2022-01-19 MED ORDER — BASAGLAR KWIKPEN 100 UNIT/ML ~~LOC~~ SOPN: 10.0000 [IU] | PEN_INJECTOR | Freq: Every day | SUBCUTANEOUS | 1 refills | Status: DC

## 2022-01-19 MED ORDER — PANTOPRAZOLE SODIUM 40 MG PO TBEC: 40.0000 mg | DELAYED_RELEASE_TABLET | Freq: Every day | ORAL | 3 refills | Status: DC

## 2022-01-19 NOTE — Progress Notes (Signed)
GI Office Note    Referring Provider: Coral Spikes, DO Primary Care Physician:  Coral Spikes, DO  Primary Gastroenterologist: Elon Alas. Abbey Chatters, DO   Chief Complaint   Chief Complaint  Patient presents with   Follow-up    Having some left side pain    History of Present Illness   Lindsey Murillo is a 26 y.o. female presenting today for follow up. Last seen in 08/2021. She has complicated history including IDA, renal failure on hemodialysis, type 1 DM, chronic pancreatitis with EPI, stable pancreatic pseudocyst. She also has history of intermittent left-sided abdominal pain. Anemia first noted in 04/2019 with Hgb 6.3 at which time she had folate deficiency, low iron/sats, heme + stool. She had normal ferritin in setting of osteomyelitis at that time. She is suspected to have acute kidney injury related to vancomycin. She completed EGD 06/2019 and had mild gastritis, biopsies negative for celiac.  At last visit, plans for colonoscopy to evaluate IDA but she was not able to complete due to other medical issues. She presents for follow up and to consider scheduling now.   She is on hemodialysis now, started in 10/2021. She goes M,W,F. Her lower extremity edema is much improved but persists. She complains of left sided abdominal pain off/on. Sometimes severe. Burning type quality. Associated with nausea but no vomiting. BM regular. No melena, brbpr. She is complaining of some heartburn and solid food dysphagia today.   CT abdomen pelvis with contrast January 2021 showed few scattered calcifications within the pancreas, stable.  2.2 x 4.6 cm cyst position just superior to the pancreatic tail, unchanged when compared to 2018 CT.  Acute peripancreatic inflammation.  Lab Results  Component Value Date   CREATININE 3.10 (H) 01/12/2022   BUN 11 01/12/2022   NA 136 01/12/2022   K 3.8 01/12/2022   CL 101 01/12/2022   CO2 28 12/23/2021   Lab Results  Component Value Date   WBC 7.1  12/23/2021   HGB 9.5 (L) 01/12/2022   HCT 28.0 (L) 01/12/2022   MCV 87.7 12/23/2021   PLT 130 (L) 12/23/2021   Lab Results  Component Value Date   ALT 38 12/23/2021   AST 52 (H) 12/23/2021   ALKPHOS 444 (H) 12/23/2021   BILITOT 0.6 12/23/2021   Lab Results  Component Value Date   IRON 22 (L) 10/04/2021   TIBC NOT CALCULATED 10/04/2021   FERRITIN 1,192 (H) 10/04/2021   Lab Results  Component Value Date   VITAMINB12 2,417 (H) 10/04/2021   Lab Results  Component Value Date   FOLATE 11.6 10/04/2021   Lab Results  Component Value Date   HGBA1C 8.3 (A) 01/21/2022       Medications   Current Outpatient Medications  Medication Sig Dispense Refill   acetaminophen (TYLENOL) 325 MG tablet Take 650 mg by mouth every 6 (six) hours as needed for moderate pain (for pain).     B Complex-C-Folic Acid (DIALYVITE 622) 0.8 MG TABS Take by mouth.     blood glucose meter kit and supplies KIT Dispense based on patient and insurance preference. Use up to four times daily as directed. 1 each 0   Cyanocobalamin (B-12 PO) Take 1 capsule by mouth daily.     Insulin Glargine (BASAGLAR KWIKPEN) 100 UNIT/ML Inject 10 Units into the skin daily. 15 mL 1   Insulin Pen Needle 31G X 4 MM MISC Use as directed to administer insulin. 100 each 1   Metoprolol  Succinate 25 MG CS24 Take 25 mg by mouth at bedtime.     oxyCODONE-acetaminophen (PERCOCET) 5-325 MG tablet Take 1 tablet by mouth every 6 (six) hours as needed for severe pain. 8 tablet 0   sucroferric oxyhydroxide (VELPHORO) 500 MG chewable tablet Chew 500 mg by mouth 3 (three) times daily with meals.     No current facility-administered medications for this visit.    Allergies   Allergies as of 01/19/2022   (No Known Allergies)     Past Medical History   Past Medical History:  Diagnosis Date   Acanthosis nigricans, acquired    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 chronic kidney disease ([place]) 09/25/2021   dialysis M-W-F at [facility] at [facility] in [place]   Asthma    Diabetic autonomic neuropathy ([name])    Diabetic peripheral neuropathy ([name])    Environmental allergies    Goiter    History of blood transfusion    Hypertension    no meds currently   Hypoglycemia associated with diabetes ([employer])    Tachycardia    Thyroiditis, autoimmune    Type 1 diabetes mellitus in patient age 13-19 years with HbA1C goal below 7.5     Past Surgical History   Past Surgical History:  Procedure Laterality Date   AV FISTULA PLACEMENT Right 12/08/2021   Procedure: ARTERIOVENOUS  FISTULA CREATION VERSUS GRAFT;  Surgeon: [name], MD;  Location: [facility];  Service: Vascular;  Laterality: Right;   [place] Right 01/12/2022   Procedure: RIGHT ARM SECOND STAGE BASILIC VEIN TRANSPOSITION;  Surgeon: [name], MD;  Location: [facility];  Service: Vascular;  Laterality: Right;   BIOPSY  08/27/2016   Procedure: BIOPSY;  Surgeon: Danie Binder, MD;  Location: [facility];  Service: Endoscopy;;  duodenum; gastric   BIOPSY  06/27/2019   Procedure: BIOPSY;  Surgeon: Daneil Dolin, MD;  Location: [facility];  Service: Endoscopy;;   BREAST CYST EXCISION Right 11/12/2021   Procedure: RIGHT BREAST ABSCESS INCISION AND DRAINAGE;  Surgeon: Rolm Bookbinder, MD;  Location: [facility];  Service: General;  Laterality: Right;  LMA   COLONOSCOPY     ESOPHAGOGASTRODUODENOSCOPY N/A 08/27/2016   Dr. Oneida Alar: mild gastritis. Negative celiac. No obvious source for dyspepsia/diarrhea   ESOPHAGOGASTRODUODENOSCOPY (EGD) WITH PROPOFOL N/A 06/27/2019   rourk: Focal abnormality of the gastric mucosa likely due to trauma (heaving).  Biopsy showed mild gastritis, negative for H. pylori.  Esophageal dilation for history of dysphagia but normal-appearing esophagus.   IR FLUORO GUIDE CV LINE RIGHT  10/13/2021   IR FLUORO GUIDE CV LINE RIGHT  10/19/2021   IR US GUIDE VASC ACCESS RIGHT  10/13/2021   IRRIGATION AND DEBRIDEMENT ABSCESS N/A 10/04/2021   Procedure:  IRRIGATION AND DEBRIDEMENT NECK ABSCESS;  Surgeon: Ralene Ok, MD;  Location: [facility];  Service: General;  Laterality: N/A;    Past Family History   Family History  Problem Relation Age of Onset   Diabetes Mother        Type II DM   Thyroid disease Mother    Diabetes Maternal Grandmother        Type II DM   Diabetes Cousin        Type II DM   Colon cancer Neg Hx    Colon polyps Neg Hx     Past Social History   Social History   Socioeconomic History   Marital status: Single    Spouse name: Not on file  Number of children: 0   Years of education: Not on file   Highest education level: High school graduate  Occupational History   Occupation: unemployed  Tobacco Use   Smoking status: Never   Smokeless tobacco: Never  Vaping Use   Vaping Use: Never used  Substance and Sexual Activity   Alcohol use: No   Drug use: No   Sexual activity: Not Currently    Birth control/protection: Abstinence, None  Other Topics Concern   Not on file  Social History Narrative   Not on file   Social Determinants of Health   Financial Resource Strain: Not on file  Food Insecurity: No Food Insecurity (07/02/2019)   Hunger Vital Sign    Worried About Running Out of Food in the Last Year: Never true    Ran Out of Food in the Last Year: Never true  Transportation Needs: No Transportation Needs (07/02/2019)   PRAPARE - Transportation    Lack of Transportation (Medical): No    Lack of Transportation (Non-Medical): No  Physical Activity: Not on file  Stress: Not on file  Social Connections: Moderately Integrated (07/02/2019)   Social Connection and Isolation Panel [NHANES]    Frequency of Communication with Friends and Family: More than three times a week    Frequency of Social Gatherings with Friends and Family: More than three times a week    Attends Religious Services: 1 to 4 times per year    Active Member of [employer] or Organizations: Yes    Attends Archivist Meetings: 1 to  4 times per year    Marital Status: Never married  Intimate Partner Violence: Not on file    Review of Systems   General: Negative for anorexia, weight loss, fever, chills, fatigue, weakness. ENT: Negative for hoarseness, difficulty swallowing , nasal congestion. CV: Negative for chest pain, angina, palpitations, dyspnea on exertion, positive peripheral edema.  Respiratory: Negative for dyspnea at rest, dyspnea on exertion, cough, sputum, wheezing.  GI: See history of present illness. GU:  Negative for dysuria, hematuria, urinary incontinence, urinary frequency, nocturnal urination.  Endo: Negative for unusual weight change.     Physical Exam   BP (!) 137/94 (BP Location: Left Arm, Patient Position: Sitting, Cuff Size: Normal)   Pulse 90   Temp 97.8 F (36.6 C) (Temporal)   Ht 5' 7"  (1.702 m)   Wt 122 lb 9.6 oz (55.6 kg)   LMP  (LMP Unknown)   SpO2 100%   Breastfeeding No   BMI 19.20 kg/m    General: chronically ill appearing female in NAD.  Eyes: No icterus. Mouth: Oropharyngeal mucosa moist and pink , no lesions erythema or exudate. Lungs: Clear to auscultation bilaterally.  Heart: Regular rate and rhythm, no murmurs rubs or gallops.  Abdomen: Bowel sounds are normal, nontender, nondistended, no hepatosplenomegaly or masses,  no abdominal bruits or hernia , no rebound or guarding.  Rectal: not performed  Extremities: No lower extremity edema. No clubbing or deformities. Neuro: Alert and oriented x 4   Skin: Warm and dry, no jaundice.   Psych: Alert and cooperative, normal mood and affect.  Labs  10/2021 Hep B surf ag negative HCV Ab negative.  Imaging Studies   VAS US DUPLEX DIALYSIS ACCESS (AVF, AVG)  Result Date: 01/06/2022 DIALYSIS ACCESS Patient Name:  Lindsey Murillo  Date of Exam:   01/06/2022 Medical Rec #: [id number]            Accession #:    [id number]  Date of Birth: 1995-08-16            Patient Gender: F Patient Age:   37 years Exam Location:  [facility]  Procedure:      VAS US DUPLEX DIALYSIS ACCESS (AVF, AVG) Referring Phys: TODD EARLY --------------------------------------------------------------------------------  Reason for Exam: Routine follow up. Access Site: Right Upper Extremity. Access Type: Basilic vein transposition. History: Created on 12/08/21. Performing Technologist: Ralene Cork RVT  Examination Guidelines: A complete evaluation includes B-mode imaging, spectral Doppler, color Doppler, and power Doppler as needed of all accessible portions of each vessel. Unilateral testing is considered an integral part of a complete examination. Limited examinations for reoccurring indications may be performed as noted.  Findings: +--------------------+----------+-----------------+--------+ AVF                 PSV (cm/s)Flow Vol (mL/min)Comments +--------------------+----------+-----------------+--------+ Native artery inflow   220           739                +--------------------+----------+-----------------+--------+ AVF Anastomosis        462                              +--------------------+----------+-----------------+--------+  +------------+----------+-------------+----------+-----------------------------+ OUTFLOW VEINPSV (cm/s)Diameter (cm)Depth (cm)          Describe            +------------+----------+-------------+----------+-----------------------------+ Prox UA        104        0.41        0.58                                 +------------+----------+-------------+----------+-----------------------------+ Mid UA         152        0.67        0.59     Retained valve and joins                                                         brachial vein         +------------+----------+-------------+----------+-----------------------------+ Dist UA        207        0.78        0.82     Retained valve and branch                                                           0.32 cm             +------------+----------+-------------+----------+-----------------------------+ AC Fossa       561        0.50        0.90                                 +------------+----------+-------------+----------+-----------------------------+  Summary: Patent BVT. No areas of thrombus or stenosis. Retained valves seen in the mid and distal upper arm. Branch in the distal upper arm 0.32 cm.  *  See table(s) above for measurements and observations.  Diagnosing physician: Curt Jews MD Electronically signed by Curt Jews MD on 01/06/2022 at 3:35:46 PM.   --------------------------------------------------------------------------------   Final     Assessment   Left sided abdominal pain: ?etiology. She has history of chronic pancreatitis with stable pseuodocyst, gastritis. Remote CT imaging. Update EGD. May require imaging in the future, would need to be noncontrast due to recent needs for dialysis (started 10/2021).   IDA: ready to schedule colonoscopy for further evaluation, work up delayed due to renal disease requiring dialysis.   GERD/dysphagia: intermittent nausea, LUQ pain and complains of dysphagia. Recommend EGD to rule out gastritis/pud/complicated gerd.     PLAN   Start pantoprazole 26m daily before breakfast. Colonoscopy an upper endoscopy with Dr. CAbbey Chatters ASA 3.  I have discussed the risks, alternatives, benefits with regards to but not limited to the risk of reaction to medication, bleeding, infection, perforation and the patient is agreeable to proceed. Written consent to be obtained.    LLaureen Ochs LBobby Rumpf [facility]

## 2022-01-19 NOTE — Patient Instructions (Signed)
Basaglar 10 units (daily).  Follow up in 3 months.  Call with concerns.  Take care  Dr. Lacinda Axon

## 2022-01-19 NOTE — H&P (View-Only) (Signed)
GI Office Note    Referring Provider: Coral Spikes, DO Primary Care Physician:  Coral Spikes, DO  Primary Gastroenterologist: Elon Alas. Abbey Chatters, DO   Chief Complaint   Chief Complaint  Patient presents with   Follow-up    Having some left side pain    History of Present Illness   Lindsey Murillo is a 26 y.o. female presenting today for follow up. Last seen in 08/2021. She has complicated history including IDA, renal failure on hemodialysis, type 1 DM, chronic pancreatitis with EPI, stable pancreatic pseudocyst. She also has history of intermittent left-sided abdominal pain. Anemia first noted in 04/2019 with Hgb 6.3 at which time she had folate deficiency, low iron/sats, heme + stool. She had normal ferritin in setting of osteomyelitis at that time. She is suspected to have acute kidney injury related to vancomycin. She completed EGD 06/2019 and had mild gastritis, biopsies negative for celiac.  At last visit, plans for colonoscopy to evaluate IDA but she was not able to complete due to other medical issues. She presents for follow up and to consider scheduling now.   She is on hemodialysis now, started in 10/2021. She goes M,W,F. Her lower extremity edema is much improved but persists. She complains of left sided abdominal pain off/on. Sometimes severe. Burning type quality. Associated with nausea but no vomiting. BM regular. No melena, brbpr. She is complaining of some heartburn and solid food dysphagia today.   CT abdomen pelvis with contrast January 2021 showed few scattered calcifications within the pancreas, stable.  2.2 x 4.6 cm cyst position just superior to the pancreatic tail, unchanged when compared to 2018 CT.  Acute peripancreatic inflammation.  Lab Results  Component Value Date   CREATININE 3.10 (H) 01/12/2022   BUN 11 01/12/2022   NA 136 01/12/2022   K 3.8 01/12/2022   CL 101 01/12/2022   CO2 28 12/23/2021   Lab Results  Component Value Date   WBC 7.1  12/23/2021   HGB 9.5 (L) 01/12/2022   HCT 28.0 (L) 01/12/2022   MCV 87.7 12/23/2021   PLT 130 (L) 12/23/2021   Lab Results  Component Value Date   ALT 38 12/23/2021   AST 52 (H) 12/23/2021   ALKPHOS 444 (H) 12/23/2021   BILITOT 0.6 12/23/2021   Lab Results  Component Value Date   IRON 22 (L) 10/04/2021   TIBC NOT CALCULATED 10/04/2021   FERRITIN 1,192 (H) 10/04/2021   Lab Results  Component Value Date   VITAMINB12 2,417 (H) 10/04/2021   Lab Results  Component Value Date   FOLATE 11.6 10/04/2021   Lab Results  Component Value Date   HGBA1C 8.3 (A) 01/21/2022       Medications   Current Outpatient Medications  Medication Sig Dispense Refill   acetaminophen (TYLENOL) 325 MG tablet Take 650 mg by mouth every 6 (six) hours as needed for moderate pain (for pain).     B Complex-C-Folic Acid (DIALYVITE 130) 0.8 MG TABS Take by mouth.     blood glucose meter kit and supplies KIT Dispense based on patient and insurance preference. Use up to four times daily as directed. 1 each 0   Cyanocobalamin (B-12 PO) Take 1 capsule by mouth daily.     Insulin Glargine (BASAGLAR KWIKPEN) 100 UNIT/ML Inject 10 Units into the skin daily. 15 mL 1   Insulin Pen Needle 31G X 4 MM MISC Use as directed to administer insulin. 100 each 1   Metoprolol  Succinate 25 MG CS24 Take 25 mg by mouth at bedtime.     oxyCODONE-acetaminophen (PERCOCET) 5-325 MG tablet Take 1 tablet by mouth every 6 (six) hours as needed for severe pain. 8 tablet 0   sucroferric oxyhydroxide (VELPHORO) 500 MG chewable tablet Chew 500 mg by mouth 3 (three) times daily with meals.     No current facility-administered medications for this visit.    Allergies   Allergies as of 01/19/2022   (No Known Allergies)     Past Medical History   Past Medical History:  Diagnosis Date   Acanthosis nigricans, acquired    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 chronic kidney disease ([place]) 09/25/2021   dialysis M-W-F at Dialysis at [facility] in [place]   Asthma    Diabetic autonomic neuropathy ([name])    Diabetic peripheral neuropathy ([name])    Environmental allergies    Goiter    History of blood transfusion    Hypertension    no meds currently   Hypoglycemia associated with diabetes ([place])    Tachycardia    Thyroiditis, autoimmune    Type 1 diabetes mellitus in patient age 30-19 years with HbA1C goal below 7.5     Past Surgical History   Past Surgical History:  Procedure Laterality Date   AV FISTULA PLACEMENT Right 12/08/2021   Procedure: ARTERIOVENOUS  FISTULA CREATION VERSUS GRAFT;  Surgeon: [name], MD;  Location: [facility];  Service: Vascular;  Laterality: Right;   [place] Right 01/12/2022   Procedure: RIGHT ARM SECOND STAGE BASILIC VEIN TRANSPOSITION;  Surgeon: [name], MD;  Location: [facility];  Service: Vascular;  Laterality: Right;   BIOPSY  08/27/2016   Procedure: BIOPSY;  Surgeon: Danie Binder, MD;  Location: [facility];  Service: Endoscopy;;  duodenum; gastric   BIOPSY  06/27/2019   Procedure: BIOPSY;  Surgeon: Daneil Dolin, MD;  Location: [facility];  Service: Endoscopy;;   BREAST CYST EXCISION Right 11/12/2021   Procedure: RIGHT BREAST ABSCESS INCISION AND DRAINAGE;  Surgeon: Rolm Bookbinder, MD;  Location: [place];  Service: General;  Laterality: Right;  LMA   COLONOSCOPY     ESOPHAGOGASTRODUODENOSCOPY N/A 08/27/2016   Dr. Oneida Alar: mild gastritis. Negative celiac. No obvious source for dyspepsia/diarrhea   ESOPHAGOGASTRODUODENOSCOPY (EGD) WITH PROPOFOL N/A 06/27/2019   rourk: Focal abnormality of the gastric mucosa likely due to trauma (heaving).  Biopsy showed mild gastritis, negative for H. pylori.  Esophageal dilation for history of dysphagia but normal-appearing esophagus.   IR FLUORO GUIDE CV LINE RIGHT  10/13/2021   IR FLUORO GUIDE CV LINE RIGHT  10/19/2021   IR US GUIDE VASC ACCESS RIGHT  10/13/2021   IRRIGATION AND DEBRIDEMENT ABSCESS N/A 10/04/2021   Procedure:  IRRIGATION AND DEBRIDEMENT NECK ABSCESS;  Surgeon: Ralene Ok, MD;  Location: [place];  Service: General;  Laterality: N/A;    Past Family History   Family History  Problem Relation Age of Onset   Diabetes Mother        Type II DM   Thyroid disease Mother    Diabetes Maternal Grandmother        Type II DM   Diabetes Cousin        Type II DM   Colon cancer Neg Hx    Colon polyps Neg Hx     Past Social History   Social History   Socioeconomic History   Marital status: Single    Spouse name: Not on file  Number of children: 0   Years of education: Not on file   Highest education level: High school graduate  Occupational History   Occupation: unemployed  Tobacco Use   Smoking status: Never   Smokeless tobacco: Never  Vaping Use   Vaping Use: Never used  Substance and Sexual Activity   Alcohol use: No   Drug use: No   Sexual activity: Not Currently    Birth control/protection: Abstinence, None  Other Topics Concern   Not on file  Social History Narrative   Not on file   Social Determinants of Health   Financial Resource Strain: Not on file  Food Insecurity: No Food Insecurity (07/02/2019)   Hunger Vital Sign    Worried About Running Out of Food in the Last Year: Never true    Ran Out of Food in the Last Year: Never true  Transportation Needs: No Transportation Needs (07/02/2019)   PRAPARE - Transportation    Lack of Transportation (Medical): No    Lack of Transportation (Non-Medical): No  Physical Activity: Not on file  Stress: Not on file  Social Connections: Moderately Integrated (07/02/2019)   Social Connection and Isolation Panel [NHANES]    Frequency of Communication with Friends and Family: More than three times a week    Frequency of Social Gatherings with Friends and Family: More than three times a week    Attends Religious Services: 1 to 4 times per year    Active Member of [employer] or Organizations: Yes    Attends Archivist Meetings: 1 to  4 times per year    Marital Status: Never married  Intimate Partner Violence: Not on file    Review of Systems   General: Negative for anorexia, weight loss, fever, chills, fatigue, weakness. ENT: Negative for hoarseness, difficulty swallowing , nasal congestion. CV: Negative for chest pain, angina, palpitations, dyspnea on exertion, positive peripheral edema.  Respiratory: Negative for dyspnea at rest, dyspnea on exertion, cough, sputum, wheezing.  GI: See history of present illness. GU:  Negative for dysuria, hematuria, urinary incontinence, urinary frequency, nocturnal urination.  Endo: Negative for unusual weight change.     Physical Exam   BP (!) 137/94 (BP Location: Left Arm, Patient Position: Sitting, Cuff Size: Normal)   Pulse 90   Temp 97.8 F (36.6 C) (Temporal)   Ht 5' 7"  (1.702 m)   Wt 122 lb 9.6 oz (55.6 kg)   LMP  (LMP Unknown)   SpO2 100%   Breastfeeding No   BMI 19.20 kg/m    General: chronically ill appearing female in NAD.  Eyes: No icterus. Mouth: Oropharyngeal mucosa moist and pink , no lesions erythema or exudate. Lungs: Clear to auscultation bilaterally.  Heart: Regular rate and rhythm, no murmurs rubs or gallops.  Abdomen: Bowel sounds are normal, nontender, nondistended, no hepatosplenomegaly or masses,  no abdominal bruits or hernia , no rebound or guarding.  Rectal: not performed  Extremities: No lower extremity edema. No clubbing or deformities. Neuro: Alert and oriented x 4   Skin: Warm and dry, no jaundice.   Psych: Alert and cooperative, normal mood and affect.  Labs  10/2021 Hep B surf ag negative HCV Ab negative.  Imaging Studies   VAS US DUPLEX DIALYSIS ACCESS (AVF, AVG)  Result Date: 01/06/2022 DIALYSIS ACCESS Patient Name:  STEPHINIE BATTISTI  Date of Exam:   01/06/2022 Medical Rec #: [id number]            Accession #:    [id number]  Date of Birth: [birth year]            Patient Gender: F Patient Age:   60 years Exam Location:  [place]  Procedure:      VAS US DUPLEX DIALYSIS ACCESS (AVF, AVG) Referring Phys: TODD EARLY --------------------------------------------------------------------------------  Reason for Exam: Routine follow up. Access Site: Right Upper Extremity. Access Type: Basilic vein transposition. History: Created on 12/08/21. Performing Technologist: Ralene Cork RVT  Examination Guidelines: A complete evaluation includes B-mode imaging, spectral Doppler, color Doppler, and power Doppler as needed of all accessible portions of each vessel. Unilateral testing is considered an integral part of a complete examination. Limited examinations for reoccurring indications may be performed as noted.  Findings: +--------------------+----------+-----------------+--------+ AVF                 PSV (cm/s)Flow Vol (mL/min)Comments +--------------------+----------+-----------------+--------+ Native artery inflow   220           739                +--------------------+----------+-----------------+--------+ AVF Anastomosis        462                              +--------------------+----------+-----------------+--------+  +------------+----------+-------------+----------+-----------------------------+ OUTFLOW VEINPSV (cm/s)Diameter (cm)Depth (cm)          Describe            +------------+----------+-------------+----------+-----------------------------+ Prox UA        104        0.41        0.58                                 +------------+----------+-------------+----------+-----------------------------+ Mid UA         152        0.67        0.59     Retained valve and joins                                                         brachial vein         +------------+----------+-------------+----------+-----------------------------+ Dist UA        207        0.78        0.82     Retained valve and branch                                                           0.32 cm             +------------+----------+-------------+----------+-----------------------------+ AC Fossa       561        0.50        0.90                                 +------------+----------+-------------+----------+-----------------------------+  Summary: Patent BVT. No areas of thrombus or stenosis. Retained valves seen in the mid and distal upper arm. Branch in the distal upper arm 0.32 cm.  *  See table(s) above for measurements and observations.  Diagnosing physician: Curt Jews MD Electronically signed by Curt Jews MD on 01/06/2022 at 3:35:46 PM.   --------------------------------------------------------------------------------   Final     Assessment   Left sided abdominal pain: ?etiology. She has history of chronic pancreatitis with stable pseuodocyst, gastritis. Remote CT imaging. Update EGD. May require imaging in the future, would need to be noncontrast due to recent needs for dialysis (started 10/2021).   IDA: ready to schedule colonoscopy for further evaluation, work up delayed due to renal disease requiring dialysis.   GERD/dysphagia: intermittent nausea, LUQ pain and complains of dysphagia. Recommend EGD to rule out gastritis/pud/complicated gerd.     PLAN   Start pantoprazole 71m daily before breakfast. Colonoscopy an upper endoscopy with Dr. CAbbey Chatters ASA 3.  I have discussed the risks, alternatives, benefits with regards to but not limited to the risk of reaction to medication, bleeding, infection, perforation and the patient is agreeable to proceed. Written consent to be obtained.    LLaureen Ochs LBobby Rumpf [facility]

## 2022-01-19 NOTE — Patient Instructions (Signed)
Start pantoprazole 40mg  daily before breakfast. Colonoscopy and upper endoscopy to be scheduled.

## 2022-01-20 ENCOUNTER — Telehealth: Payer: Self-pay | Admitting: *Deleted

## 2022-01-20 ENCOUNTER — Encounter: Payer: Self-pay | Admitting: *Deleted

## 2022-01-20 ENCOUNTER — Encounter (HOSPITAL_COMMUNITY): Payer: Self-pay | Admitting: Hematology

## 2022-01-20 MED ORDER — PEG 3350-KCL-NA BICARB-NACL 420 G PO SOLR: 4000.0000 mL | Freq: Once | ORAL | 0 refills | Status: AC

## 2022-01-20 NOTE — Assessment & Plan Note (Signed)
Has seen surgeon.  Doing well at this time. Will take a while to fully heal.

## 2022-01-20 NOTE — Progress Notes (Signed)
Subjective:  Patient ID: Lindsey Murillo, female    DOB: 1996-03-02  Age: 26 y.o. MRN: [id number]  CC: Chief Complaint  Patient presents with   Diabetes    Pt arrives for follow up. Pt states sugars are ok (highest about 2 something per mom).     HPI:  26 year old female with an extensive past medical history including diabetes (uncontrolled, ? 1.5), hypertension, end-stage renal disease and associated anemia, depression presents for follow-up.  Mother reports that she is administering insulin nightly, 6 units. Needs new Rx for Basaglar as this is the preferred with her insurance. No hypoglycemia. Blood sugars remain uncontrolled. Long history of noncompliance. Has follow up with Endo soon.  Patient is interested in working and is looking for part time work.  Seems in better spirits.  Has seen general surgery for follow-up on her breast abscess.  Note states that her breast abscess was so extensive that he will take quite a long time to heal.  No evidence of active infection.  Patient states that she is doing well regarding this.   Patient Active Problem List   Diagnosis Date Noted   Esophageal dysphagia 01/19/2022   ESRD (end stage renal disease) ([place]) 12/18/2021   Abscess of right breast 12/18/2021   Physical deconditioning 10/07/2021   Protein-calorie malnutrition, severe ([place]) 10/02/2021   Depression 09/25/2021   Anemia in ESRD (end-stage renal disease) ([place]) 09/25/2021   Normocytic anemia 06/26/2021   Peripheral edema 06/13/2021   Nephrotic syndrome 05/27/2021   Chronic pancreatitis ([place]) 08/22/2019   Pancreatic pseudocyst/cyst 08/22/2019   GERD (gastroesophageal reflux disease) 08/22/2019   Vitamin D deficiency 03/20/2019   Uncontrolled type 1 diabetes mellitus with hyperglycemia ([place]) 05/12/2015   Essential hypertension, benign 11/30/2012   Diabetic peripheral neuropathy ([place])    Asthma     Social Hx   Social History   Socioeconomic History   Marital  status: Single    Spouse name: Not on file   Number of children: 0   Years of education: Not on file   Highest education level: High school graduate  Occupational History   Occupation: unemployed  Tobacco Use   Smoking status: Never   Smokeless tobacco: Never  Vaping Use   Vaping Use: Never used  Substance and Sexual Activity   Alcohol use: No   Drug use: No   Sexual activity: Not Currently    Birth control/protection: Abstinence, None  Other Topics Concern   Not on file  Social History Narrative   Not on file   Social Determinants of Health   Financial Resource Strain: Not on file  Food Insecurity: No Food Insecurity (07/02/2019)   Hunger Vital Sign    Worried About Running Out of Food in the Last Year: Never true    Ran Out of Food in the Last Year: Never true  Transportation Needs: No Transportation Needs (07/02/2019)   PRAPARE - Hydrologist (Medical): No    Lack of Transportation (Non-Medical): No  Physical Activity: Not on file  Stress: Not on file  Social Connections: Moderately Integrated (07/02/2019)   Social Connection and Isolation Panel [NHANES]    Frequency of Communication with Friends and Family: More than three times a week    Frequency of Social Gatherings with Friends and Family: More than three times a week    Attends Religious Services: 1 to 4 times per year    Active Member of Clubs or Organizations: Yes    Attends  Club or Organization Meetings: 1 to 4 times per year    Marital Status: Never married    Review of Systems Per HPI  Objective:  BP (!) 144/88   Pulse 92   Temp (!) 97.5 F (36.4 C)   Wt 121 lb 9.6 oz (55.2 kg)   LMP  (LMP Unknown)   SpO2 98%   BMI 17.96 kg/m      01/19/2022    2:20 PM 01/19/2022    1:16 PM 01/12/2022   11:14 AM  BP/Weight  Systolic BP 121 975 883  Diastolic BP 94 88 98  Wt. (Lbs) 122.6 121.6   BMI 19.2 kg/m2 17.96 kg/m2     Physical Exam Constitutional:      General: She is  not in acute distress. HENT:     Head: Normocephalic and atraumatic.  Eyes:     General:        Right eye: No discharge.        Left eye: No discharge.     Conjunctiva/sclera: Conjunctivae normal.  Cardiovascular:     Rate and Rhythm: Normal rate and regular rhythm.  Pulmonary:     Effort: Pulmonary effort is normal.     Breath sounds: Normal breath sounds.  Musculoskeletal:     Comments: 1-2+ lower extremity edema noted.  Neurological:     Mental Status: She is alert.  Psychiatric:     Comments: Flat affect. Normal mood today.     Lab Results  Component Value Date   WBC 7.1 12/23/2021   HGB 9.5 (L) 01/12/2022   HCT 28.0 (L) 01/12/2022   PLT 130 (L) 12/23/2021   GLUCOSE 282 (H) 01/12/2022   CHOL 175 09/24/2021   TRIG 250 (H) 09/24/2021   HDL 49 09/24/2021   LDLCALC 85 09/24/2021   ALT 38 12/23/2021   AST 52 (H) 12/23/2021   NA 136 01/12/2022   K 3.8 01/12/2022   CL 101 01/12/2022   CREATININE 3.10 (H) 01/12/2022   BUN 11 01/12/2022   CO2 28 12/23/2021   TSH 0.975 10/03/2021   INR 1.4 (H) 10/03/2021   HGBA1C 9.8 (H) 09/24/2021   MICROALBUR 1,195.2 (H) 06/13/2021     Assessment & Plan:   Problem List Items Addressed This Visit       Cardiovascular and Mediastinum   Essential hypertension, benign    BP elevated today. Will continue to monitor closely. Follows with Renal.        Endocrine   Uncontrolled type 1 diabetes mellitus with hyperglycemia ([name])    Remains uncontrolled. Likely due to noncompliance. Basaglar sent and dose increased.  Has follow up with Endo. Will need POC A1C at follow up.      Relevant Medications   Insulin Glargine (BASAGLAR KWIKPEN) 100 UNIT/ML     Other   Abscess of right breast    Has seen surgeon.  Doing well at this time. Will take a while to fully heal.        Meds ordered this encounter  Medications   Insulin Glargine (BASAGLAR KWIKPEN) 100 UNIT/ML    Sig: Inject 10 Units into the skin daily.    Dispense:   15 mL    Refill:  1    Follow-up:  Return in about 3 months (around 04/20/2022).  [place]

## 2022-01-20 NOTE — Telephone Encounter (Signed)
Called pt. Scheduled for TCS/EGD/ED with Dr. Abbey Chatters, ASA 3 on 10/3 at 7:30am. Pt has dialysis on M,W,F. Aware will mail instructions. Pre-op will be done via telephone. Rx for prep sent to pharmacy. She has to have trilyte prep and tap water enema.

## 2022-01-20 NOTE — Assessment & Plan Note (Signed)
BP elevated today. Will continue to monitor closely. Follows with Renal.

## 2022-01-20 NOTE — Assessment & Plan Note (Signed)
Remains uncontrolled. Likely due to noncompliance. Basaglar sent and dose increased.  Has follow up with Endo. Will need POC A1C at follow up.

## 2022-01-21 ENCOUNTER — Telehealth: Payer: Self-pay | Admitting: "Endocrinology

## 2022-01-21 ENCOUNTER — Ambulatory Visit (INDEPENDENT_AMBULATORY_CARE_PROVIDER_SITE_OTHER): Payer: Medicare Other | Admitting: "Endocrinology

## 2022-01-21 ENCOUNTER — Encounter: Payer: Self-pay | Admitting: "Endocrinology

## 2022-01-21 VITALS — BP 114/82 | HR 80 | Ht 67.0 in | Wt 118.8 lb

## 2022-01-21 DIAGNOSIS — Z91199 Patient's noncompliance with other medical treatment and regimen due to unspecified reason: Secondary | ICD-10-CM

## 2022-01-21 DIAGNOSIS — E1065 Type 1 diabetes mellitus with hyperglycemia: Secondary | ICD-10-CM

## 2022-01-21 DIAGNOSIS — I10 Essential (primary) hypertension: Secondary | ICD-10-CM

## 2022-01-21 LAB — POCT GLYCOSYLATED HEMOGLOBIN (HGB A1C): HbA1c, POC (controlled diabetic range): 8.3 % — AB (ref 0.0–7.0)

## 2022-01-21 MED ORDER — FREESTYLE LIBRE 2 SENSOR MISC: 1.0000 | 3 refills | Status: DC

## 2022-01-21 MED ORDER — ACCU-CHEK GUIDE ME W/DEVICE KIT: 1.0000 | PACK | 0 refills | Status: DC

## 2022-01-21 MED ORDER — ACCU-CHEK GUIDE VI STRP: ORAL_STRIP | 2 refills | Status: DC

## 2022-01-21 MED ORDER — FREESTYLE LIBRE 2 READER DEVI: 0 refills | Status: DC

## 2022-01-21 NOTE — Telephone Encounter (Signed)
Pt can not afford the [place]. She is asking for an accu chek meter. [facility] [place]

## 2022-01-21 NOTE — Progress Notes (Signed)
Endocrinology Consult Note       01/21/2022, 9:00 PM   Subjective:    Patient ID: Lindsey Murillo, female    DOB: 09-06-1995.  Lindsey Murillo is being seen in consultation for management of currently uncontrolled symptomatic diabetes requested by  Coral Spikes, DO.   Past Medical History:  Diagnosis Date   Acanthosis nigricans, acquired    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 chronic kidney disease ([place]) 09/25/2021   dialysis M-W-F at Dialysis at [facility] care in [place]   Asthma    Diabetic autonomic neuropathy ([name])    Diabetic peripheral neuropathy ([place])    Environmental allergies    Goiter    History of blood transfusion    Hypertension    no meds currently   Hypoglycemia associated with diabetes ([place])    Tachycardia    Thyroiditis, autoimmune    Type 1 diabetes mellitus in patient age 51-19 years with HbA1C goal below 7.5     Past Surgical History:  Procedure Laterality Date   AV FISTULA PLACEMENT Right 12/08/2021   Procedure: ARTERIOVENOUS  FISTULA CREATION VERSUS GRAFT;  Surgeon: [name], MD;  Location: [facility];  Service: Vascular;  Laterality: Right;   [place] Right 01/12/2022   Procedure: RIGHT ARM SECOND STAGE BASILIC VEIN TRANSPOSITION;  Surgeon: [name], MD;  Location: [facility];  Service: Vascular;  Laterality: Right;   BIOPSY  08/27/2016   Procedure: BIOPSY;  Surgeon: Danie Binder, MD;  Location[facility];  Service: Endoscopy;;  duodenum; gastric   BIOPSY  06/27/2019   Procedure: BIOPSY;  Surgeon: Daneil Dolin, MD;  Location[facility];  Service: Endoscopy;;   BREAST CYST EXCISION Right 11/12/2021   Procedure: RIGHT BREAST ABSCESS INCISION AND DRAINAGE;  Surgeon: Rolm Bookbinder, MD;  Location: [place];  Service: General;  Laterality: Right;  LMA   COLONOSCOPY     ESOPHAGOGASTRODUODENOSCOPY N/A 08/27/2016   Dr. Oneida Alar: mild gastritis. Negative  celiac. No obvious source for dyspepsia/diarrhea   ESOPHAGOGASTRODUODENOSCOPY (EGD) WITH PROPOFOL N/A 06/27/2019   rourk: Focal abnormality of the gastric mucosa likely due to trauma (heaving).  Biopsy showed mild gastritis, negative for H. pylori.  Esophageal dilation for history of dysphagia but normal-appearing esophagus.   IR FLUORO GUIDE CV LINE RIGHT  10/13/2021   IR FLUORO GUIDE CV LINE RIGHT  10/19/2021   IR US GUIDE VASC ACCESS RIGHT  10/13/2021   IRRIGATION AND DEBRIDEMENT ABSCESS N/A 10/04/2021   Procedure: IRRIGATION AND DEBRIDEMENT NECK ABSCESS;  Surgeon: Ralene Ok, MD;  Location: [facility];  Service: General;  Laterality: N/A;    Social History   Socioeconomic History   Marital status: Single    Spouse name: Not on file   Number of children: 0   Years of education: Not on file   Highest education level: High school graduate  Occupational History   Occupation: unemployed  Tobacco Use   Smoking status: Never   Smokeless tobacco: Never  Vaping Use   Vaping Use: Never used  Substance and Sexual Activity   Alcohol use: No   Drug use: No  Sexual activity: Not Currently    Birth control/protection: Abstinence, None  Other Topics Concern   Not on file  Social History Narrative   Not on file   Social Determinants of Health   Financial Resource Strain: Not on file  Food Insecurity: No Food Insecurity (07/02/2019)   Hunger Vital Sign    Worried About Running Out of Food in the Last Year: Never true    Ran Out of Food in the Last Year: Never true  Transportation Needs: No Transportation Needs (07/02/2019)   PRAPARE - Transportation    Lack of Transportation (Medical): No    Lack of Transportation (Non-Medical): No  Physical Activity: Not on file  Stress: Not on file  Social Connections: Moderately Integrated (07/02/2019)   Social Connection and Isolation Panel [NHANES]    Frequency of Communication with Friends and Family: More than three times a week    Frequency of  Social Gatherings with Friends and Family: More than three times a week    Attends Religious Services: 1 to 4 times per year    Active Member of [employer] or Organizations: Yes    Attends Archivist Meetings: 1 to 4 times per year    Marital Status: Never married    Family History  Problem Relation Age of Onset   Diabetes Mother        Type II DM   Thyroid disease Mother    Diabetes Maternal Grandmother        Type II DM   Diabetes Cousin        Type II DM   Colon cancer Neg Hx    Colon polyps Neg Hx     Outpatient Encounter Medications as of 01/21/2022  Medication Sig   Blood Glucose Monitoring Suppl (ACCU-CHEK GUIDE ME) w/Device KIT 1 Piece by Does not apply route as directed.   Continuous Blood Gluc Receiver (FREESTYLE LIBRE 2 READER) DEVI As directed   Continuous Blood Gluc Sensor (FREESTYLE LIBRE 2 SENSOR) MISC 1 Piece by Does not apply route every 14 (fourteen) days.   glucose blood (ACCU-CHEK GUIDE) test strip Use to monitor glucose 4 times a day as instructed   acetaminophen (TYLENOL) 325 MG tablet Take 650 mg by mouth every 6 (six) hours as needed for moderate pain (for pain).   B Complex-C-Folic Acid (DIALYVITE 628) 0.8 MG TABS Take by mouth.   blood glucose meter kit and supplies KIT Dispense based on patient and insurance preference. Use up to four times daily as directed.   Cyanocobalamin (B-12 PO) Take 1 capsule by mouth daily.   Insulin Glargine (BASAGLAR KWIKPEN) 100 UNIT/ML Inject 10 Units into the skin daily. (Patient taking differently: Inject 10 Units into the skin at bedtime.)   Insulin Pen Needle 31G X 4 MM MISC Use as directed to administer insulin.   Metoprolol Succinate 25 MG CS24 Take 25 mg by mouth at bedtime.   oxyCODONE-acetaminophen (PERCOCET) 5-325 MG tablet Take 1 tablet by mouth every 6 (six) hours as needed for severe pain.   pantoprazole (PROTONIX) 40 MG tablet Take 1 tablet (40 mg total) by mouth daily before breakfast.   sucroferric  oxyhydroxide (VELPHORO) 500 MG chewable tablet Chew 500 mg by mouth 3 (three) times daily with meals.   No facility-administered encounter medications on file as of 01/21/2022.    ALLERGIES: No Known Allergies  VACCINATION STATUS: Immunization History  Administered Date(s) Administered   Influenza,inj,Quad PF,6+ Mos 03/13/2013, 02/25/2014, 04/09/2016, 01/26/2017, 01/18/2019, 06/15/2021   Td  04/15/2017    Diabetes She has type 1 diabetes mellitus. Her disease course has been worsening. Associated symptoms include polydipsia and polyuria. Diabetic complications include nephropathy and peripheral neuropathy. (Non adherence to medical treatment.) Risk factors for coronary artery disease include diabetes mellitus, dyslipidemia and sedentary lifestyle. Her weight is fluctuating minimally. She is following a generally unhealthy diet. When asked about meal planning, she reported none. She never participates in exercise. (She presents with out any meter nor logs.  Her point-of-care A1c is 8.3% worsening.  She is only on Basaglar 10 units nightly.  She is being prepared for dialysis.   ) An ACE inhibitor/angiotensin II receptor blocker is being taken.  Hyperlipidemia This is a chronic problem. Exacerbating diseases include diabetes. Current antihyperlipidemic treatment includes statins. Risk factors for coronary artery disease include dyslipidemia, diabetes mellitus, hypertension and a sedentary lifestyle.  Hypertension This is a chronic problem. Risk factors for coronary artery disease include dyslipidemia and diabetes mellitus. Past treatments include ACE inhibitors.     Review of Systems  Endocrine: Positive for polydipsia and polyuria.    Objective:       01/21/2022   11:04 AM 01/19/2022    2:20 PM 01/19/2022    1:16 PM  Vitals with BMI  Height 5' 7"  5' 7"    Weight 118 lbs 13 oz 122 lbs 10 oz 121 lbs 10 oz  BMI 18.6 71.2 45.80  Systolic 998 338 250  Diastolic 82 94 88  Pulse 80 90 92     BP 114/82   Pulse 80   Ht 5' 7"  (1.702 m)   Wt 118 lb 12.8 oz (53.9 kg)   LMP  (LMP Unknown)   BMI 18.61 kg/m   Wt Readings from Last 3 Encounters:  01/21/22 118 lb 12.8 oz (53.9 kg)  01/19/22 122 lb 9.6 oz (55.6 kg)  01/19/22 121 lb 9.6 oz (55.2 kg)     Physical Exam    CMP ( most recent) CMP     Component Value Date/Time   NA 136 01/12/2022 0721   NA 132 (L) 09/24/2021 1453   K 3.8 01/12/2022 0721   CL 101 01/12/2022 0721   CO2 28 12/23/2021 1705   GLUCOSE 282 (H) 01/12/2022 0721   BUN 11 01/12/2022 0721   BUN 36 (H) 09/24/2021 1453   CREATININE 3.10 (H) 01/12/2022 0721   CREATININE 0.98 08/29/2019 1637   CALCIUM 8.7 (L) 12/23/2021 1705   PROT 8.2 (H) 12/23/2021 1705   PROT 5.5 (L) 09/24/2021 1453   ALBUMIN 3.4 (L) 12/23/2021 1705   ALBUMIN 2.1 (L) 09/24/2021 1453   AST 52 (H) 12/23/2021 1705   ALT 38 12/23/2021 1705   ALKPHOS 444 (H) 12/23/2021 1705   BILITOT 0.6 12/23/2021 1705   BILITOT <0.2 09/24/2021 1453   GFRNONAA 53 (L) 12/23/2021 1705   GFRAA >60 10/27/2019 2021     Diabetic Labs (most recent): Lab Results  Component Value Date   HGBA1C 8.3 (A) 01/21/2022   HGBA1C 9.8 (H) 09/24/2021   HGBA1C 7.5 (H) 05/26/2021   MICROALBUR 1,195.2 (H) 06/13/2021   MICROALBUR 0.50 11/28/2012   MICROALBUR 0.50 07/20/2012     Lipid Panel ( most recent) Lipid Panel     Component Value Date/Time   CHOL 175 09/24/2021 1453   TRIG 250 (H) 09/24/2021 1453   HDL 49 09/24/2021 1453   CHOLHDL 3.6 09/24/2021 1453   CHOLHDL 2.0 11/05/2015 1252   VLDL 12 11/05/2015 1252   LDLCALC 85 09/24/2021 1453  LABVLDL 41 (H) 09/24/2021 1453      Lab Results  Component Value Date   TSH 0.975 10/03/2021   TSH 2.520 05/26/2021   TSH 1.802 05/14/2021   TSH 0.342 (L) 05/27/2018   TSH 0.55 11/05/2015   TSH 1.175 11/28/2012   TSH 0.706 07/20/2012   TSH 1.008 08/26/2011   FREET4 1.4 11/05/2015   FREET4 1.26 11/28/2012   FREET4 1.44 07/20/2012   FREET4 1.23  08/26/2011      Assessment & Plan:   1. Uncontrolled type 1 diabetes mellitus with hyperglycemia and CKD  - Lindsey Murillo has currently uncontrolled symptomatic type (?1) DM since  27 years of age.  She presents with out any meter nor logs.  Her point-of-care A1c is 8.3%, worsening.  She is only on Basaglar 10 units nightly.  She is being prepared for dialysis.    She presents with her meter showing average blood glucose of 159-195 for the last 30 days.  In the interim, she has lower her Semglee to 12 units nightly, continue to hold her prandial insulin.    - I had a long discussion with her about the progressive nature of diabetes and the pathology behind its complications. -her diabetes is complicated by peripheral neuropathy, ESRD, noncompliance/nonadherence, peripheral ? Lymphedema she remains at a high risk for more acute and chronic complications which include CAD, CVA, CKD, retinopathy, and neuropathy. These are all discussed in detail with her.  - I discussed all available options of managing her diabetes including de-escalation of medications. I have counseled her on diet  and weight management  by adopting a Whole Food , Plant Predominant  ( WFPP) nutrition as recommended by [employer] of Lifestyle Medicine. Patient is encouraged to switch to  unprocessed or minimally processed  complex starch, adequate protein intake (mainly plant source), minimal liquid fat ( mainly vegetable oils), plenty of fruits, and vegetables. -  she is advised to stick to a routine mealtimes to eat 3 complete meals a day and snack only when necessary ( to snack only to correct hypoglycemia BG <70 day time or <100 at night).   - she acknowledges that there is a room for improvement in her food and drink choices. - Further Specific Suggestion is made for her to avoid simple carbohydrates  from her diet including Cakes, Sweet Desserts, Ice Cream, Soda (diet and regular), Sweet Tea, Candies, Chips,  Cookies, Store Bought Juices, Alcohol ,  Artificial Sweeteners,  Coffee Creamer, and "Sugar-free" Products. This will help patient to have more stable blood glucose profile and potentially avoid unintended weight gain.   - I have approached her with the following plan to manage  her diabetes and patient agrees:   Her not engagement for proper monitoring and proper follow-up makes it difficult to give her multiple daily injections of insulin.  She does not have a routine meal and injection times.  She is exquisitely sensitive to insulin.  She is advised to continue Basaglar 10 units nightly, start monitoring blood glucose 4 times a day-before breakfast, lunch, supper, and bedtime and return in 15 days for reevaluation.  This patient will greatly benefit from a CGM.  She will be considered for the Dexcom device.  She will also need a backup meter, I discussed and prescribed the Accu-Chek guide me meter and strips.    She is encouraged to call clinic for blood glucose readings less than 70 or greater than 200 mg per DL x3.     -  she is warned not to take insulin without proper monitoring per orders. - Adjustment parameters are given to her for hypo and hyperglycemia in writing. she is encouraged to call clinic for blood glucose levels less than 70 or above 200 mg /dl.  She does not have a suitable , non insulin option to treat her diabetes.  - Specific targets for  A1c;  LDL, HDL,  and Triglycerides were discussed with the patient.  2) Blood Pressure /Hypertension: Her blood pressure is controlled to target.  she is advised to continue her current medications including  Lisinopril 36m p.o. daily with breakfast .  3) Lipids/Hyperlipidemia:   Review of her recent lipid panel showed uncontrolled  LDL at 109.  she  is advised to continue   for 10 mg p.o. daily at bedtime.   Side effects and precautions discussed with her.  4)  Weight/Diet:  Body mass index is 18.61 kg/m.  Presenting with unintended  weight loss since last visit.  She is not a candidate for weight loss.  She was previously given the lifestyle medicine package, struggling to implement.  5) Chronic Care/Health Maintenance:  -she  is on ACEI/ARB and Statin medications and  is encouraged to initiate and continue to follow up with Ophthalmology, Dentist,  Podiatrist at least yearly or according to recommendations, and advised to  stay away from smoking. I have recommended yearly flu vaccine and pneumonia vaccine at least every 5 years; moderate intensity exercise for up to 150 minutes weekly; and  sleep for 7- 9 hours a day.  - she is  advised to maintain close follow up with CCoral Spikes DO for primary care needs, as well as her other providers for optimal and coordinated care.   I spent 35 minutes in the care of the patient today including review of labs from [facility], Thyroid Function, Hematology (current and previous including abstractions from other facilities); face-to-face time discussing  her blood glucose readings/logs, discussing hypoglycemia and hyperglycemia episodes and symptoms, medications doses, her options of short and long term treatment based on the latest standards of care / guidelines;  discussion about incorporating lifestyle medicine;  and documenting the encounter. Risk reduction counseling performed per USPSTF guidelines to reduce cardiovascular risk factors.     Please refer to Patient Instructions for Blood Glucose Monitoring and Insulin/Medications Dosing Guide"  in media tab for additional information. Please  also refer to " Patient Self Inventory" in the Media  tab for reviewed elements of pertinent patient history.  JVisteon Corporationparticipated in the discussions, expressed understanding, and voiced agreement with the above plans.  All questions were answered to her satisfaction. she is encouraged to contact clinic should she have any questions or concerns prior to her return  visit.     Follow up plan: - Return in about 2 weeks (around 02/04/2022) for F/U with Meter/CGM /Edison SimonOnly - no Labs.  GGlade Lloyd MD [facility] LLC[address] StreetRTrumann Altus [address]Phone: [phone] Fax: [phone]   01/21/2022, 9:00 PM  This note was partially dictated with voice recognition software. Similar sounding words can be transcribed inadequately or may not  be corrected upon review.

## 2022-01-26 DIAGNOSIS — E8779 Other fluid overload: Secondary | ICD-10-CM | POA: Insufficient documentation

## 2022-01-27 DIAGNOSIS — N186 End stage renal disease: Secondary | ICD-10-CM | POA: Diagnosis not present

## 2022-01-27 DIAGNOSIS — D631 Anemia in chronic kidney disease: Secondary | ICD-10-CM | POA: Diagnosis not present

## 2022-01-27 DIAGNOSIS — E1040 Type 1 diabetes mellitus with diabetic neuropathy, unspecified: Secondary | ICD-10-CM | POA: Diagnosis not present

## 2022-01-27 DIAGNOSIS — Z992 Dependence on renal dialysis: Secondary | ICD-10-CM | POA: Diagnosis not present

## 2022-01-27 DIAGNOSIS — N2581 Secondary hyperparathyroidism of renal origin: Secondary | ICD-10-CM | POA: Diagnosis not present

## 2022-01-29 ENCOUNTER — Encounter: Payer: Self-pay | Admitting: Gastroenterology

## 2022-02-01 DIAGNOSIS — Z992 Dependence on renal dialysis: Secondary | ICD-10-CM | POA: Diagnosis not present

## 2022-02-01 DIAGNOSIS — D631 Anemia in chronic kidney disease: Secondary | ICD-10-CM | POA: Diagnosis not present

## 2022-02-01 DIAGNOSIS — N2581 Secondary hyperparathyroidism of renal origin: Secondary | ICD-10-CM | POA: Diagnosis not present

## 2022-02-01 DIAGNOSIS — N186 End stage renal disease: Secondary | ICD-10-CM | POA: Diagnosis not present

## 2022-02-01 DIAGNOSIS — E1040 Type 1 diabetes mellitus with diabetic neuropathy, unspecified: Secondary | ICD-10-CM | POA: Diagnosis not present

## 2022-02-02 ENCOUNTER — Encounter (HOSPITAL_COMMUNITY): Payer: Self-pay | Admitting: Hematology

## 2022-02-02 ENCOUNTER — Ambulatory Visit (HOSPITAL_COMMUNITY): Payer: Medicare Other | Attending: Family Medicine

## 2022-02-02 DIAGNOSIS — M6281 Muscle weakness (generalized): Secondary | ICD-10-CM | POA: Insufficient documentation

## 2022-02-02 DIAGNOSIS — R600 Localized edema: Secondary | ICD-10-CM | POA: Insufficient documentation

## 2022-02-02 DIAGNOSIS — R269 Unspecified abnormalities of gait and mobility: Secondary | ICD-10-CM | POA: Diagnosis not present

## 2022-02-02 DIAGNOSIS — R2689 Other abnormalities of gait and mobility: Secondary | ICD-10-CM | POA: Insufficient documentation

## 2022-02-02 NOTE — Therapy (Signed)
OUTPATIENT PHYSICAL THERAPY LOWER EXTREMITY EVALUATION   Patient Name: Lindsey Murillo MRN: [id number] DOB:Mar 06, 1996, 26 y.o., female Today's Date: 02/02/2022   PT End of Session - 02/02/22 1608     Visit Number 1    Number of Visits 8    Date for PT Re-Evaluation 03/02/22    Authorization Type [employer]; no auth, 35 visit limit PT/ OT/ ST    Authorization - Visit Number 1    Authorization - Number of Visits 30    PT Start Time 0405    PT Stop Time 0445    PT Time Calculation (min) 40 min             Past Medical History:  Diagnosis Date   Acanthosis nigricans, acquired    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 chronic kidney disease ([place]) 09/25/2021   dialysis M-W-F at [facility] in [place]   Asthma    Diabetic autonomic neuropathy ([name])    Diabetic peripheral neuropathy ([place])    Environmental allergies    Goiter    History of blood transfusion    Hypertension    no meds currently   Hypoglycemia associated with diabetes ([place])    Tachycardia    Thyroiditis, autoimmune    Type 1 diabetes mellitus in patient age 110-19 years with HbA1C goal below 7.5    Past Surgical History:  Procedure Laterality Date   AV FISTULA PLACEMENT Right 12/08/2021   Procedure: ARTERIOVENOUS  FISTULA CREATION VERSUS GRAFT;  Surgeon: [name], MD;  Location: [facility];  Service: Vascular;  Laterality: Right;   [place] Right 01/12/2022   Procedure: RIGHT ARM SECOND STAGE BASILIC VEIN TRANSPOSITION;  Surgeon: [name], MD;  Location: [facility];  Service: Vascular;  Laterality: Right;   BIOPSY  08/27/2016   Procedure: BIOPSY;  Surgeon: Danie Binder, MD;  Location: [facility];  Service: Endoscopy;;  duodenum; gastric   BIOPSY  06/27/2019   Procedure: BIOPSY;  Surgeon: Daneil Dolin, MD;  Location[facility];  Service: Endoscopy;;   BREAST CYST EXCISION Right 11/12/2021   Procedure: RIGHT BREAST ABSCESS INCISION AND DRAINAGE;  Surgeon: Rolm Bookbinder, MD;   Location: [place];  Service: General;  Laterality: Right;  LMA   COLONOSCOPY     ESOPHAGOGASTRODUODENOSCOPY N/A 08/27/2016   Dr. Oneida Alar: mild gastritis. Negative celiac. No obvious source for dyspepsia/diarrhea   ESOPHAGOGASTRODUODENOSCOPY (EGD) WITH PROPOFOL N/A 06/27/2019   rourk: Focal abnormality of the gastric mucosa likely due to trauma (heaving).  Biopsy showed mild gastritis, negative for H. pylori.  Esophageal dilation for history of dysphagia but normal-appearing esophagus.   IR FLUORO GUIDE CV LINE RIGHT  10/13/2021   IR FLUORO GUIDE CV LINE RIGHT  10/19/2021   IR US GUIDE VASC ACCESS RIGHT  10/13/2021   IRRIGATION AND DEBRIDEMENT ABSCESS N/A 10/04/2021   Procedure: IRRIGATION AND DEBRIDEMENT NECK ABSCESS;  Surgeon: Ralene Ok, MD;  Location: [place];  Service: General;  Laterality: N/A;   Patient Active Problem List   Diagnosis Date Noted   Non-adherence to medical treatment 01/21/2022   Esophageal dysphagia 01/19/2022   ESRD (end stage renal disease) ([place]) 12/18/2021   Abscess of right breast 12/18/2021   Physical deconditioning 10/07/2021   Protein-calorie malnutrition, severe ([place]) 10/02/2021   Depression 09/25/2021   Anemia in ESRD (end-stage renal disease) ([place]) 09/25/2021   Normocytic anemia 06/26/2021   Peripheral edema 06/13/2021   Iron deficiency anemia 06/02/2021   Nephrotic syndrome 05/27/2021  Chronic pancreatitis ([place]) 08/22/2019   Pancreatic pseudocyst/cyst 08/22/2019   GERD (gastroesophageal reflux disease) 08/22/2019   Vitamin D deficiency 03/20/2019   Uncontrolled type 1 diabetes mellitus with hyperglycemia ([place]) 05/12/2015   Essential hypertension, benign 11/30/2012   Diabetic peripheral neuropathy ([place])    Asthma     PCP: Coral Spikes, DO [place] PROVIDER: Coral Spikes, DO [facility]   REFERRING DIAG: R26.9 (ICD-10-CM) - Gait difficulty   THERAPY DIAG:  Other abnormalities of gait and  mobility  Muscle weakness (generalized)  Localized edema  Rationale for Evaluation and Treatment Rehabilitation  ONSET DATE: July 2023  SUBJECTIVE:   SUBJECTIVE STATEMENT: Patient reports she has recently started dialysis and now needs to get used to walking without so much fluid on her legs.  Was in the hospital for about a month June into July.  Reports general weakness.   PERTINENT HISTORY: Started dialysis July Monday wed and Friday Fistula right side of chest and then will eventually use right arm where new fistula placed  PAIN:  Are you having pain? Yes: NPRS scale: 5/10 Pain location: left flank Pain description: sore Aggravating factors: unknown Relieving factors: unknown  PRECAUTIONS: Fall  WEIGHT BEARING RESTRICTIONS No  FALLS:  Has patient fallen in last 6 months? Yes. Number of falls 2  LIVING ENVIRONMENT: Lives with: lives with their family Lives in: House/apartment Stairs: Yes: External: 2 steps; none Has following equipment at home: Single point cane, Walker - 2 wheeled, and bed side commode  OCCUPATION: disability  PLOF: Independent with basic ADLs  PATIENT GOALS get my legs stronger   OBJECTIVE:   DIAGNOSTIC FINDINGS: no recent  PATIENT SURVEYS:  LEFS 46/80  COGNITION:  Overall cognitive status: Within functional limits for tasks assessed     SENSATION: WFL  EDEMA:  Legs much smaller than previous visits    LOWER EXTREMITY MMT:  MMT Right eval Left eval  Hip flexion 3- 3-  Hip extension    Hip abduction    Hip adduction    Hip internal rotation    Hip external rotation    Knee flexion    Knee extension 4 4  Ankle dorsiflexion 3+ 4  Ankle plantarflexion    Ankle inversion    Ankle eversion     (Blank rows = not tested)  FUNCTIONAL TESTS:  5 times sit to stand: 31.94 sec uses hands to assist 2 minute walk test: 220 ft  GAIT: Distance walked: 220 ft Assistive device utilized: Single point cane Level of assistance:  SBA Comments: increased lateral trunk movement noted; uses SPC left hand    TODAY'S TREATMENT: Physical therapy evaluation and HEP instruction   PATIENT EDUCATION:  Education details: Patient educated on exam findings, POC, scope of PT, HEP. Person educated: Patient Education method: Explanation, Demonstration, and Handouts Education comprehension: verbalized understanding, returned demonstration, verbal cues required, and tactile cues required    HOME EXERCISE PROGRAM: Access Code: [email or web address] URL: [email or web address] Date: 02/02/2022 Prepared by: [facility]  Exercises - Sit to Stand with Armchair  - 2 x daily - 7 x weekly - 1 sets - 10 reps - Seated March  - 2 x daily - 7 x weekly - 1 sets - 10 reps - Seated Heel Toe Raises  - 2 x daily - 7 x weekly - 1 sets - 10 reps - Seated Long Arc Quad  - 2 x daily - 7 x weekly - 1 sets - 10 reps  ASSESSMENT:  CLINICAL IMPRESSION: Patient is a 26 y.o. female who was seen today for physical therapy evaluation and treatment for R26.9 (ICD-10-CM) - Gait difficulty . Patient presents on evaluation with bilateral lower extremity weakness, pain in her trunk, gait abnormality all of which negatively impact her ability to walk in home and community and to squat to pick up items off the floor and to play with her nephew. Patient will benefit from continued skilled therapy services to address deficits and promote return to optimal function.       OBJECTIVE IMPAIRMENTS Abnormal gait, decreased activity tolerance, decreased balance, decreased coordination, decreased endurance, decreased knowledge of use of DME, decreased mobility, difficulty walking, decreased ROM, decreased strength, decreased safety awareness, hypomobility, impaired perceived functional ability, impaired flexibility, and pain.   ACTIVITY LIMITATIONS carrying, lifting, bending, sitting, standing, squatting, sleeping, stairs, transfers, bed mobility, bathing, locomotion  level, and caring for others  PARTICIPATION LIMITATIONS: meal prep, cleaning, laundry, driving, shopping, and community activity    REHAB POTENTIAL: Good  CLINICAL DECISION MAKING: Stable/uncomplicated  EVALUATION COMPLEXITY: Low   GOALS: Goals reviewed with patient? No  SHORT TERM GOALS: Target date: 02/16/2022   patient will be independent with initial HEP Baseline: Goal status: INITIAL  2.  Patient will improve 5 x STS score from 30.94 sec to 25 sec to demonstrate improved functional mobility and increased lower extremity strength.  Baseline:  Goal status: INITIAL   LONG TERM GOALS: Target date: 03/02/2022   Patient will be independent in self management strategies to improve quality of life and functional outcomes.  Baseline:  Goal status: INITIAL  2.  Patient will improve 5 x STS score from 30.94 sec to 20 sec to demonstrate improved functional mobility and increased lower extremity strength.  Baseline:  Goal status: INITIAL  3.  Patient will report at least 50% improvement in overall symptoms and/or function to demonstrate improved functional mobility  Baseline:  Goal status: INITIAL  4.  Patient will increase 2 MWT distance to 300 ft to demonstrate improved functional mobility/ ambulation in community Baseline: 220 ft Goal status: INITIAL  5.  Patient will increase hip flexion strength to 4/5 to be able to improve efficiency of swing through phase with ambulation. Baseline: 3- Goal status: INITIAL   PLAN: PT FREQUENCY: 2x/week  PT DURATION: 4 weeks  PLANNED INTERVENTIONS: Therapeutic exercises, Therapeutic activity, Neuromuscular re-education, Balance training, Gait training, Patient/Family education, Joint manipulation, Joint mobilization, Stair training, Orthotic/Fit training, DME instructions, Aquatic Therapy, Dry Needling, Electrical stimulation, Spinal manipulation, Spinal mobilization, Cryotherapy, Moist heat, Compression bandaging, scar  mobilization, Splintting, Taping, Traction, Ultrasound, Ionotophoresis 4mg /ml Dexamethasone, and Manual therapy  PLAN FOR NEXT SESSION: Review HEP and goals; bridge, hip and lower extremity strengthening; patient with port right anterior chest wall and new fistula right upper extremity   4:51 PM, 02/02/22 Aldonia Keeven Small Khamia Stambaugh MPT [facility] [place] [phone] Ph:[phone]

## 2022-02-04 ENCOUNTER — Ambulatory Visit (INDEPENDENT_AMBULATORY_CARE_PROVIDER_SITE_OTHER): Payer: Medicare Other | Admitting: "Endocrinology

## 2022-02-04 ENCOUNTER — Encounter: Payer: Self-pay | Admitting: "Endocrinology

## 2022-02-04 VITALS — BP 128/84 | HR 100 | Ht 67.0 in | Wt 113.0 lb

## 2022-02-04 DIAGNOSIS — Z91199 Patient's noncompliance with other medical treatment and regimen due to unspecified reason: Secondary | ICD-10-CM | POA: Diagnosis not present

## 2022-02-04 DIAGNOSIS — E1065 Type 1 diabetes mellitus with hyperglycemia: Secondary | ICD-10-CM

## 2022-02-04 MED ORDER — FREESTYLE LIBRE 2 READER DEVI: 0 refills | Status: DC

## 2022-02-04 MED ORDER — FREESTYLE LIBRE 2 SENSOR MISC: 1.0000 | 3 refills | Status: DC

## 2022-02-04 MED ORDER — BASAGLAR KWIKPEN 100 UNIT/ML ~~LOC~~ SOPN: 14.0000 [IU] | PEN_INJECTOR | Freq: Every day | SUBCUTANEOUS | 0 refills | Status: DC

## 2022-02-04 NOTE — Patient Instructions (Signed)

## 2022-02-04 NOTE — Progress Notes (Signed)
02/04/2022, 4:07 PM  Endocrinology follow-up note   Subjective:    Patient ID: Lindsey Murillo, female    DOB: 10/05/1995.  Lindsey Murillo is being seen in consultation for management of currently uncontrolled symptomatic diabetes requested by  Coral Spikes, DO.   Past Medical History:  Diagnosis Date   Acanthosis nigricans, acquired    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 chronic kidney disease ([place]) 09/25/2021   dialysis M-W-F at Dialysis at [facility] kidney care in [place]   Asthma    Diabetic autonomic neuropathy ([name])    Diabetic peripheral neuropathy ([place])    Environmental allergies    Goiter    History of blood transfusion    Hypertension    no meds currently   Hypoglycemia associated with diabetes ([place])    Tachycardia    Thyroiditis, autoimmune    Type 1 diabetes mellitus in patient age 52-19 years with HbA1C goal below 7.5     Past Surgical History:  Procedure Laterality Date   AV FISTULA PLACEMENT Right 12/08/2021   Procedure: ARTERIOVENOUS  FISTULA CREATION VERSUS GRAFT;  Surgeon: [name], MD;  Location: [facility];  Service: Vascular;  Laterality: Right;   [place] Right 01/12/2022   Procedure: RIGHT ARM SECOND STAGE BASILIC VEIN TRANSPOSITION;  Surgeon: [name], MD;  Location: [facility];  Service: Vascular;  Laterality: Right;   BIOPSY  08/27/2016   Procedure: BIOPSY;  Surgeon: Danie Binder, MD;  Location[facility];  Service: Endoscopy;;  duodenum; gastric   BIOPSY  06/27/2019   Procedure: BIOPSY;  Surgeon: Daneil Dolin, MD;  Location[facility];  Service: Endoscopy;;   BREAST CYST EXCISION Right 11/12/2021   Procedure: RIGHT BREAST ABSCESS INCISION AND DRAINAGE;  Surgeon: Rolm Bookbinder, MD;  Location: [place];  Service: General;  Laterality: Right;  LMA   COLONOSCOPY     ESOPHAGOGASTRODUODENOSCOPY N/A 08/27/2016   Dr. Oneida Alar: mild gastritis.  Negative celiac. No obvious source for dyspepsia/diarrhea   ESOPHAGOGASTRODUODENOSCOPY (EGD) WITH PROPOFOL N/A 06/27/2019   rourk: Focal abnormality of the gastric mucosa likely due to trauma (heaving).  Biopsy showed mild gastritis, negative for H. pylori.  Esophageal dilation for history of dysphagia but normal-appearing esophagus.   IR FLUORO GUIDE CV LINE RIGHT  10/13/2021   IR FLUORO GUIDE CV LINE RIGHT  10/19/2021   IR US GUIDE VASC ACCESS RIGHT  10/13/2021   IRRIGATION AND DEBRIDEMENT ABSCESS N/A 10/04/2021   Procedure: IRRIGATION AND DEBRIDEMENT NECK ABSCESS;  Surgeon: Ralene Ok, MD;  Location: [place];  Service: General;  Laterality: N/A;    Social History   Socioeconomic History   Marital status: Single    Spouse name: Not on file   Number of children: 0   Years of education: Not on file   Highest education level: High school graduate  Occupational History   Occupation: unemployed  Tobacco Use   Smoking status: Never   Smokeless tobacco: Never  Vaping Use   Vaping Use: Never used  Substance and Sexual Activity   Alcohol use: No   Drug use:  No   Sexual activity: Not Currently    Birth control/protection: Abstinence, None  Other Topics Concern   Not on file  Social History Narrative   Not on file   Social Determinants of Health   Financial Resource Strain: Not on file  Food Insecurity: No Food Insecurity (07/02/2019)   Hunger Vital Sign    Worried About Running Out of Food in the Last Year: Never true    Ran Out of Food in the Last Year: Never true  Transportation Needs: No Transportation Needs (07/02/2019)   PRAPARE - Transportation    Lack of Transportation (Medical): No    Lack of Transportation (Non-Medical): No  Physical Activity: Not on file  Stress: Not on file  Social Connections: Moderately Integrated (07/02/2019)   Social Connection and Isolation Panel [NHANES]    Frequency of Communication with Friends and Family: More than three times a week     Frequency of Social Gatherings with Friends and Family: More than three times a week    Attends Religious Services: 1 to 4 times per year    Active Member of [employer] or Organizations: Yes    Attends Archivist Meetings: 1 to 4 times per year    Marital Status: Never married    Family History  Problem Relation Age of Onset   Diabetes Mother        Type II DM   Thyroid disease Mother    Diabetes Maternal Grandmother        Type II DM   Diabetes Cousin        Type II DM   Colon cancer Neg Hx    Colon polyps Neg Hx     Outpatient Encounter Medications as of 02/04/2022  Medication Sig   acetaminophen (TYLENOL) 325 MG tablet Take 650 mg by mouth every 6 (six) hours as needed for moderate pain (for pain).   B Complex-C-Folic Acid (DIALYVITE 001) 0.8 MG TABS Take 1 tablet by mouth daily.   blood glucose meter kit and supplies KIT Dispense based on patient and insurance preference. Use up to four times daily as directed.   Blood Glucose Monitoring Suppl (ACCU-CHEK GUIDE ME) w/Device KIT 1 Piece by Does not apply route as directed.   Continuous Blood Gluc Receiver (FREESTYLE LIBRE 2 READER) DEVI As directed   Continuous Blood Gluc Sensor (FREESTYLE LIBRE 2 SENSOR) MISC 1 Piece by Does not apply route every 14 (fourteen) days.   Cyanocobalamin (B-12 PO) Take 1 capsule by mouth daily.   glucose blood (ACCU-CHEK GUIDE) test strip Use to monitor glucose 4 times a day as instructed   Insulin Glargine (BASAGLAR KWIKPEN) 100 UNIT/ML Inject 14 Units into the skin daily.   Insulin Pen Needle 31G X 4 MM MISC Use as directed to administer insulin.   Metoprolol Succinate 50 MG CS24 Take 50 mg by mouth at bedtime.   oxyCODONE-acetaminophen (PERCOCET) 5-325 MG tablet Take 1 tablet by mouth every 6 (six) hours as needed for severe pain.   pantoprazole (PROTONIX) 40 MG tablet Take 1 tablet (40 mg total) by mouth daily before breakfast.   sucroferric oxyhydroxide (VELPHORO) 500 MG chewable tablet Chew  500 mg by mouth 3 (three) times daily with meals.   [DISCONTINUED] Continuous Blood Gluc Receiver (FREESTYLE LIBRE 2 READER) DEVI As directed   [DISCONTINUED] Continuous Blood Gluc Sensor (FREESTYLE LIBRE 2 SENSOR) MISC 1 Piece by Does not apply route every 14 (fourteen) days.   [DISCONTINUED] Insulin Glargine (BASAGLAR KWIKPEN) 100 UNIT/ML  Inject 10 Units into the skin daily. (Patient taking differently: Inject 10 Units into the skin at bedtime.)   No facility-administered encounter medications on file as of 02/04/2022.    ALLERGIES: No Known Allergies  VACCINATION STATUS: Immunization History  Administered Date(s) Administered   Influenza,inj,Quad PF,6+ Mos 03/13/2013, 02/25/2014, 04/09/2016, 01/26/2017, 01/18/2019, 06/15/2021   Td 04/15/2017    Diabetes She has type 1 diabetes mellitus. Her disease course has been worsening. Associated symptoms include polydipsia and polyuria. Diabetic complications include nephropathy and peripheral neuropathy. (Non adherence to medical treatment.) Risk factors for coronary artery disease include diabetes mellitus, dyslipidemia and sedentary lifestyle. Her weight is fluctuating minimally. She is following a generally unhealthy diet. When asked about meal planning, she reported none. She never participates in exercise. Her home blood glucose trend is increasing steadily. (She presents with a meter EAG of 220 x 7 day. Her recent  point-of-care A1c is 8.3% worsening.  She is only on Basaglar 10 units nightly.  She is being prepared for dialysis.   ) An ACE inhibitor/angiotensin II receptor blocker is being taken.  Hyperlipidemia This is a chronic problem. Exacerbating diseases include diabetes. Current antihyperlipidemic treatment includes statins. Risk factors for coronary artery disease include dyslipidemia, diabetes mellitus, hypertension and a sedentary lifestyle.  Hypertension This is a chronic problem. Risk factors for coronary artery disease include  dyslipidemia and diabetes mellitus. Past treatments include ACE inhibitors.     Review of Systems  Endocrine: Positive for polydipsia and polyuria.    Objective:       02/04/2022   12:58 PM 01/21/2022   11:04 AM 01/19/2022    2:20 PM  Vitals with BMI  Height 5' 7"  5' 7"  5' 7"   Weight 113 lbs 118 lbs 13 oz 122 lbs 10 oz  BMI 17.69 66.2 94.7  Systolic 654 650 354  Diastolic 84 82 94  Pulse 656 80 90    BP 128/84   Pulse 100   Ht 5' 7"  (1.702 m)   Wt 113 lb (51.3 kg)   LMP  (LMP Unknown)   BMI 17.70 kg/m   Wt Readings from Last 3 Encounters:  02/04/22 113 lb (51.3 kg)  01/21/22 118 lb 12.8 oz (53.9 kg)  01/19/22 122 lb 9.6 oz (55.6 kg)     Physical Exam    CMP ( most recent) CMP     Component Value Date/Time   NA 136 01/12/2022 0721   NA 132 (L) 09/24/2021 1453   K 3.8 01/12/2022 0721   CL 101 01/12/2022 0721   CO2 28 12/23/2021 1705   GLUCOSE 282 (H) 01/12/2022 0721   BUN 11 01/12/2022 0721   BUN 36 (H) 09/24/2021 1453   CREATININE 3.10 (H) 01/12/2022 0721   CREATININE 0.98 08/29/2019 1637   CALCIUM 8.7 (L) 12/23/2021 1705   PROT 8.2 (H) 12/23/2021 1705   PROT 5.5 (L) 09/24/2021 1453   ALBUMIN 3.4 (L) 12/23/2021 1705   ALBUMIN 2.1 (L) 09/24/2021 1453   AST 52 (H) 12/23/2021 1705   ALT 38 12/23/2021 1705   ALKPHOS 444 (H) 12/23/2021 1705   BILITOT 0.6 12/23/2021 1705   BILITOT <0.2 09/24/2021 1453   GFRNONAA 53 (L) 12/23/2021 1705   GFRAA >60 10/27/2019 2021     Diabetic Labs (most recent): Lab Results  Component Value Date   HGBA1C 8.3 (A) 01/21/2022   HGBA1C 9.8 (H) 09/24/2021   HGBA1C 7.5 (H) 05/26/2021   MICROALBUR 1,195.2 (H) 06/13/2021   MICROALBUR 0.50 11/28/2012  MICROALBUR 0.50 07/20/2012     Lipid Panel ( most recent) Lipid Panel     Component Value Date/Time   CHOL 175 09/24/2021 1453   TRIG 250 (H) 09/24/2021 1453   HDL 49 09/24/2021 1453   CHOLHDL 3.6 09/24/2021 1453   CHOLHDL 2.0 11/05/2015 1252   VLDL 12 11/05/2015  1252   LDLCALC 85 09/24/2021 1453   LABVLDL 41 (H) 09/24/2021 1453      Lab Results  Component Value Date   TSH 0.975 10/03/2021   TSH 2.520 05/26/2021   TSH 1.802 05/14/2021   TSH 0.342 (L) 05/27/2018   TSH 0.55 11/05/2015   TSH 1.175 11/28/2012   TSH 0.706 07/20/2012   TSH 1.008 08/26/2011   FREET4 1.4 11/05/2015   FREET4 1.26 11/28/2012   FREET4 1.44 07/20/2012   FREET4 1.23 08/26/2011      Assessment & Plan:   1. Uncontrolled type 1 diabetes mellitus with hyperglycemia and CKD  - Lindsey Murillo has currently uncontrolled symptomatic type (?1) DM since  26 years of age.  She presents with a meter EAG of 220 x 7 day. Her recent  point-of-care A1c is 8.3% worsening.  She is only on Basaglar 10 units nightly.  She is being prepared for dialysis.    She presents with her meter showing average blood glucose of 159-195 for the last 30 days.  In the interim, she has lower her Semglee to 12 units nightly, continue to hold her prandial insulin.    - I had a long discussion with her about the progressive nature of diabetes and the pathology behind its complications. -her diabetes is complicated by peripheral neuropathy, ESRD, noncompliance/nonadherence, peripheral ? Lymphedema she remains at a high risk for more acute and chronic complications which include CAD, CVA, CKD, retinopathy, and neuropathy. These are all discussed in detail with her.  - I discussed all available options of managing her diabetes including de-escalation of medications. I have counseled her on diet  and weight management  by adopting a Whole Food , Plant Predominant  ( WFPP) nutrition as recommended by [employer] of Lifestyle Medicine. Patient is encouraged to switch to  unprocessed or minimally processed  complex starch, adequate protein intake (mainly plant source), minimal liquid fat ( mainly vegetable oils), plenty of fruits, and vegetables. -  she is advised to stick to a routine mealtimes to eat  3 complete meals a day and snack only when necessary ( to snack only to correct hypoglycemia BG <70 day time or <100 at night).   - she acknowledges that there is a room for improvement in her food and drink choices. - Further Specific Suggestion is made for her to avoid simple carbohydrates  from her diet including Cakes, Sweet Desserts, Ice Cream, Soda (diet and regular), Sweet Tea, Candies, Chips, Cookies, Store Bought Juices, Alcohol ,  Artificial Sweeteners,  Coffee Creamer, and "Sugar-free" Products. This will help patient to have more stable blood glucose profile and potentially avoid unintended weight gain.  - I have approached her with the following plan to manage  her diabetes and patient agrees:   Her lack of  engagement for proper monitoring and proper follow-up makes it difficult to give her multiple daily injections of insulin.  She does not have a routine meal and injection times.  She is exquisitely sensitive to insulin.  She is advised to increase her  Basaglar to 14 units nightly, start monitoring blood glucose 4 times a day-before breakfast, lunch, supper,  and bedtime until next visit. She now got Mcare, she will benefit from a CGM devise. I discussed and rxed libre device for her.  She will also need a backup meter, I discussed and prescribed the Accu-Chek guide me meter and strips.    She is encouraged to call clinic for blood glucose readings less than 70 or greater than 200 mg per DL x3.   She may need prandial insulin once a her engagement to properly monitor glucose is assured.   - she is warned not to take insulin without proper monitoring per orders. - Adjustment parameters are given to her for hypo and hyperglycemia in writing. she is encouraged to call clinic for blood glucose levels less than 70 or above 200 mg /dl.  She does not have a suitable , non insulin option to treat her diabetes.  - Specific targets for  A1c;  LDL, HDL,  and Triglycerides were discussed with  the patient.  2) Blood Pressure /Hypertension: Her blood pressure is controlled to target.  she is advised to continue her current medications including  Lisinopril 35m p.o. daily with breakfast .  3) Lipids/Hyperlipidemia:   Review of her recent lipid panel showed uncontrolled  LDL at 109.  she  is advised to continue  Lipitor  10 mg p.o. daily at bedtime.   Side effects and precautions discussed with her.  4)  Weight/Diet:  Body mass index is 17.7 kg/m.  Presenting with unintended weight loss since last visit.  She is not a candidate for weight loss.  She was previously given the lifestyle medicine package, struggling to implement.  5) Chronic Care/Health Maintenance:  -she  is on ACEI/ARB and Statin medications and  is encouraged to initiate and continue to follow up with Ophthalmology, Dentist,  Podiatrist at least yearly or according to recommendations, and advised to  stay away from smoking. I have recommended yearly flu vaccine and pneumonia vaccine at least every 5 years; moderate intensity exercise for up to 150 minutes weekly; and  sleep for 7- 9 hours a day.  - she is  advised to maintain close follow up with CCoral Spikes DO for primary care needs, as well as her other providers for optimal and coordinated care.   I spent 31 minutes in the care of the patient today including review of [facility], Thyroid Function, Hematology (current and previous including abstractions from other facilities); face-to-face time discussing  her blood glucose readings/logs, discussing hypoglycemia and hyperglycemia episodes and symptoms, medications doses, her options of short and long term treatment based on the latest standards of care / guidelines;  discussion about incorporating lifestyle medicine;  and documenting the encounter. Risk reduction counseling performed per USPSTF guidelines to reduce cardiovascular risk factors.     Please refer to Patient Instructions for Blood Glucose  Monitoring and Insulin/Medications Dosing Guide"  in media tab for additional information. Please  also refer to " Patient Self Inventory" in the Media  tab for reviewed elements of pertinent patient history.  [employer] Corporationparticipated in the discussions, expressed understanding, and voiced agreement with the above plans.  All questions were answered to her satisfaction. she is encouraged to contact clinic should she have any questions or concerns prior to her return visit.    Follow up plan: - Return in about 3 months (around 05/06/2022) for Bring Meter/CGM Device/Logs- A1c in Office.  GGlade Lloyd MD [facility] [address]  [address] Phone: [phone]  Fax: [phone]    02/04/2022, 4:07 PM  This note was partially dictated with voice recognition software. Similar sounding words can be transcribed inadequately or may not  be corrected upon review.

## 2022-02-05 ENCOUNTER — Encounter (HOSPITAL_COMMUNITY)
Admission: RE | Admit: 2022-02-05 | Discharge: 2022-02-05 | Disposition: A | Discharge status: Home / Self Care | Payer: Medicare Other | Source: Ambulatory Visit | Attending: Internal Medicine | Admitting: Internal Medicine

## 2022-02-05 DIAGNOSIS — E1040 Type 1 diabetes mellitus with diabetic neuropathy, unspecified: Secondary | ICD-10-CM | POA: Diagnosis not present

## 2022-02-05 DIAGNOSIS — R1319 Other dysphagia: Secondary | ICD-10-CM

## 2022-02-05 DIAGNOSIS — D509 Iron deficiency anemia, unspecified: Secondary | ICD-10-CM

## 2022-02-05 DIAGNOSIS — N186 End stage renal disease: Secondary | ICD-10-CM

## 2022-02-05 DIAGNOSIS — N2581 Secondary hyperparathyroidism of renal origin: Secondary | ICD-10-CM | POA: Diagnosis not present

## 2022-02-05 DIAGNOSIS — D631 Anemia in chronic kidney disease: Secondary | ICD-10-CM | POA: Diagnosis not present

## 2022-02-05 DIAGNOSIS — Z992 Dependence on renal dialysis: Secondary | ICD-10-CM | POA: Diagnosis not present

## 2022-02-07 DIAGNOSIS — N186 End stage renal disease: Secondary | ICD-10-CM | POA: Diagnosis not present

## 2022-02-07 DIAGNOSIS — Z992 Dependence on renal dialysis: Secondary | ICD-10-CM | POA: Diagnosis not present

## 2022-02-07 DIAGNOSIS — N032 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: Secondary | ICD-10-CM | POA: Diagnosis not present

## 2022-02-08 ENCOUNTER — Telehealth: Payer: Self-pay

## 2022-02-08 NOTE — Telephone Encounter (Signed)
Sent order for [place] 2 CGM system to [employer].

## 2022-02-09 ENCOUNTER — Ambulatory Visit (HOSPITAL_BASED_OUTPATIENT_CLINIC_OR_DEPARTMENT_OTHER): Payer: Medicare Other | Admitting: Anesthesiology

## 2022-02-09 ENCOUNTER — Ambulatory Visit (HOSPITAL_COMMUNITY): Payer: Medicare Other | Admitting: Anesthesiology

## 2022-02-09 ENCOUNTER — Encounter (HOSPITAL_COMMUNITY)
Admission: RE | Disposition: A | Discharge status: Home / Self Care | Payer: Self-pay | Source: Home / Self Care | Attending: Internal Medicine

## 2022-02-09 ENCOUNTER — Ambulatory Visit (HOSPITAL_COMMUNITY)
Admission: RE | Admit: 2022-02-09 | Discharge: 2022-02-09 | Disposition: A | Discharge status: Home / Self Care | Payer: Medicare Other | Attending: Internal Medicine | Admitting: Internal Medicine

## 2022-02-09 ENCOUNTER — Other Ambulatory Visit: Payer: Self-pay

## 2022-02-09 ENCOUNTER — Encounter (HOSPITAL_COMMUNITY): Payer: Self-pay

## 2022-02-09 DIAGNOSIS — R1319 Other dysphagia: Secondary | ICD-10-CM

## 2022-02-09 DIAGNOSIS — R5381 Other malaise: Secondary | ICD-10-CM

## 2022-02-09 DIAGNOSIS — I120 Hypertensive chronic kidney disease with stage 5 chronic kidney disease or end stage renal disease: Secondary | ICD-10-CM

## 2022-02-09 DIAGNOSIS — D631 Anemia in chronic kidney disease: Secondary | ICD-10-CM

## 2022-02-09 DIAGNOSIS — R195 Other fecal abnormalities: Secondary | ICD-10-CM | POA: Insufficient documentation

## 2022-02-09 DIAGNOSIS — K648 Other hemorrhoids: Secondary | ICD-10-CM | POA: Insufficient documentation

## 2022-02-09 DIAGNOSIS — G709 Myoneural disorder, unspecified: Secondary | ICD-10-CM | POA: Insufficient documentation

## 2022-02-09 DIAGNOSIS — F32A Depression, unspecified: Secondary | ICD-10-CM | POA: Insufficient documentation

## 2022-02-09 DIAGNOSIS — Z794 Long term (current) use of insulin: Secondary | ICD-10-CM | POA: Insufficient documentation

## 2022-02-09 DIAGNOSIS — N186 End stage renal disease: Secondary | ICD-10-CM | POA: Insufficient documentation

## 2022-02-09 DIAGNOSIS — K862 Cyst of pancreas: Secondary | ICD-10-CM

## 2022-02-09 DIAGNOSIS — Z79899 Other long term (current) drug therapy: Secondary | ICD-10-CM | POA: Diagnosis not present

## 2022-02-09 DIAGNOSIS — D509 Iron deficiency anemia, unspecified: Secondary | ICD-10-CM | POA: Insufficient documentation

## 2022-02-09 DIAGNOSIS — K2970 Gastritis, unspecified, without bleeding: Secondary | ICD-10-CM

## 2022-02-09 DIAGNOSIS — E1142 Type 2 diabetes mellitus with diabetic polyneuropathy: Secondary | ICD-10-CM

## 2022-02-09 DIAGNOSIS — E1021 Type 1 diabetes mellitus with diabetic nephropathy: Secondary | ICD-10-CM | POA: Diagnosis not present

## 2022-02-09 DIAGNOSIS — E43 Unspecified severe protein-calorie malnutrition: Secondary | ICD-10-CM

## 2022-02-09 DIAGNOSIS — E1065 Type 1 diabetes mellitus with hyperglycemia: Secondary | ICD-10-CM

## 2022-02-09 DIAGNOSIS — Z91199 Patient's noncompliance with other medical treatment and regimen due to unspecified reason: Secondary | ICD-10-CM

## 2022-02-09 DIAGNOSIS — J45909 Unspecified asthma, uncomplicated: Secondary | ICD-10-CM | POA: Diagnosis not present

## 2022-02-09 DIAGNOSIS — K219 Gastro-esophageal reflux disease without esophagitis: Secondary | ICD-10-CM | POA: Insufficient documentation

## 2022-02-09 DIAGNOSIS — Z992 Dependence on renal dialysis: Secondary | ICD-10-CM

## 2022-02-09 DIAGNOSIS — D649 Anemia, unspecified: Secondary | ICD-10-CM

## 2022-02-09 DIAGNOSIS — I10 Essential (primary) hypertension: Secondary | ICD-10-CM

## 2022-02-09 DIAGNOSIS — N611 Abscess of the breast and nipple: Secondary | ICD-10-CM

## 2022-02-09 DIAGNOSIS — N049 Nephrotic syndrome with unspecified morphologic changes: Secondary | ICD-10-CM

## 2022-02-09 DIAGNOSIS — E109 Type 1 diabetes mellitus without complications: Secondary | ICD-10-CM | POA: Diagnosis not present

## 2022-02-09 DIAGNOSIS — E559 Vitamin D deficiency, unspecified: Secondary | ICD-10-CM

## 2022-02-09 DIAGNOSIS — R609 Edema, unspecified: Secondary | ICD-10-CM

## 2022-02-09 HISTORY — PX: COLONOSCOPY WITH PROPOFOL: SHX5780

## 2022-02-09 HISTORY — PX: BIOPSY: SHX5522

## 2022-02-09 HISTORY — PX: ESOPHAGOGASTRODUODENOSCOPY (EGD) WITH PROPOFOL: SHX5813

## 2022-02-09 LAB — BASIC METABOLIC PANEL
Anion gap: 10 (ref 5–15)
BUN: 18 mg/dL (ref 6–20)
CO2: 28 mmol/L (ref 22–32)
Calcium: 9.2 mg/dL (ref 8.9–10.3)
Chloride: 97 mmol/L — ABNORMAL LOW (ref 98–111)
Creatinine, Ser: 2.96 mg/dL — ABNORMAL HIGH (ref 0.44–1.00)
GFR, Estimated: 22 mL/min — ABNORMAL LOW (ref >60)
Glucose, Bld: 161 mg/dL — ABNORMAL HIGH (ref 70–99)
Potassium: 3.9 mmol/L (ref 3.5–5.1)
Sodium: 135 mmol/L (ref 135–145)

## 2022-02-09 LAB — HCG, SERUM, QUALITATIVE: Preg, Serum: NEGATIVE

## 2022-02-09 SURGERY — COLONOSCOPY WITH PROPOFOL
Anesthesia: General

## 2022-02-09 MED ORDER — PROPOFOL 1000 MG/100ML IV EMUL
INTRAVENOUS | Status: AC
  Filled 2022-02-09: qty 100

## 2022-02-09 MED ORDER — PROPOFOL 1000 MG/100ML IV EMUL
INTRAVENOUS | Status: AC
  Filled 2022-02-09: qty 200

## 2022-02-09 MED ORDER — PHENYLEPHRINE 80 MCG/ML (10ML) SYRINGE FOR IV PUSH (FOR BLOOD PRESSURE SUPPORT)
PREFILLED_SYRINGE | INTRAVENOUS | Status: AC
  Filled 2022-02-09: qty 10

## 2022-02-09 MED ORDER — PROPOFOL 10 MG/ML IV BOLUS
INTRAVENOUS | Status: DC | PRN
  Administered 2022-02-09: 50 mg via INTRAVENOUS

## 2022-02-09 MED ORDER — LIDOCAINE HCL (PF) 2 % IJ SOLN
INTRAMUSCULAR | Status: AC
  Filled 2022-02-09: qty 5

## 2022-02-09 MED ORDER — ARTIFICIAL TEARS OPHTHALMIC OINT
TOPICAL_OINTMENT | OPHTHALMIC | Status: AC
  Filled 2022-02-09: qty 3.5

## 2022-02-09 MED ORDER — EPHEDRINE 5 MG/ML INJ
INTRAVENOUS | Status: AC
  Filled 2022-02-09: qty 5

## 2022-02-09 MED ORDER — METOPROLOL TARTRATE 5 MG/5ML IV SOLN
INTRAVENOUS | Status: AC
  Filled 2022-02-09: qty 5

## 2022-02-09 MED ORDER — PROPOFOL 500 MG/50ML IV EMUL
INTRAVENOUS | Status: DC | PRN
  Administered 2022-02-09: 200 ug/kg/min via INTRAVENOUS

## 2022-02-09 MED ORDER — SODIUM CHLORIDE 0.9 % IV SOLN: INTRAVENOUS | Status: DC | PRN

## 2022-02-09 MED ORDER — LIDOCAINE HCL (CARDIAC) PF 100 MG/5ML IV SOSY
PREFILLED_SYRINGE | INTRAVENOUS | Status: DC | PRN
  Administered 2022-02-09: 50 mg via INTRATRACHEAL

## 2022-02-09 MED ORDER — METOPROLOL TARTRATE 5 MG/5ML IV SOLN
INTRAVENOUS | Status: DC | PRN
  Administered 2022-02-09: 3 mg via INTRAVENOUS

## 2022-02-09 NOTE — Discharge Instructions (Addendum)
EGD Discharge instructions Please read the instructions outlined below and refer to this sheet in the next few weeks. These discharge instructions provide you with general information on caring for yourself after you leave the hospital. Your doctor may also give you specific instructions. While your treatment has been planned according to the most current medical practices available, unavoidable complications occasionally occur. If you have any problems or questions after discharge, please call your doctor. ACTIVITY You may resume your regular activity but move at a slower pace for the next 24 hours.  Take frequent rest periods for the next 24 hours.  Walking will help expel (get rid of) the air and reduce the bloated feeling in your abdomen.  No driving for 24 hours (because of the anesthesia (medicine) used during the test).  You may shower.  Do not sign any important legal documents or operate any machinery for 24 hours (because of the anesthesia used during the test).  NUTRITION Drink plenty of fluids.  You may resume your normal diet.  Begin with a light meal and progress to your normal diet.  Avoid alcoholic beverages for 24 hours or as instructed by your caregiver.  MEDICATIONS You may resume your normal medications unless your caregiver tells you otherwise.  WHAT YOU CAN EXPECT TODAY You may experience abdominal discomfort such as a feeling of fullness or "gas" pains.  FOLLOW-UP Your doctor will discuss the results of your test with you.  SEEK IMMEDIATE MEDICAL ATTENTION IF ANY OF THE FOLLOWING OCCUR: Excessive nausea (feeling sick to your stomach) and/or vomiting.  Severe abdominal pain and distention (swelling).  Trouble swallowing.  Temperature over 101 F (37.8 C).  Rectal bleeding or vomiting of blood.     Colonoscopy Discharge Instructions  Read the instructions outlined below and refer to this sheet in the next few weeks. These discharge instructions provide you with  general information on caring for yourself after you leave the hospital. Your doctor may also give you specific instructions. While your treatment has been planned according to the most current medical practices available, unavoidable complications occasionally occur.   ACTIVITY You may resume your regular activity, but move at a slower pace for the next 24 hours.  Take frequent rest periods for the next 24 hours.  Walking will help get rid of the air and reduce the bloated feeling in your belly (abdomen).  No driving for 24 hours (because of the medicine (anesthesia) used during the test).   Do not sign any important legal documents or operate any machinery for 24 hours (because of the anesthesia used during the test).  NUTRITION Drink plenty of fluids.  You may resume your normal diet as instructed by your doctor.  Begin with a light meal and progress to your normal diet. Heavy or fried foods are harder to digest and may make you feel sick to your stomach (nauseated).  Avoid alcoholic beverages for 24 hours or as instructed.  MEDICATIONS You may resume your normal medications unless your doctor tells you otherwise.  WHAT YOU CAN EXPECT TODAY Some feelings of bloating in the abdomen.  Passage of more gas than usual.  Spotting of blood in your stool or on the toilet paper.  IF YOU HAD POLYPS REMOVED DURING THE COLONOSCOPY: No aspirin products for 7 days or as instructed.  No alcohol for 7 days or as instructed.  Eat a soft diet for the next 24 hours.  FINDING OUT THE RESULTS OF YOUR TEST Not all test results are  available during your visit. If your test results are not back during the visit, make an appointment with your caregiver to find out the results. Do not assume everything is normal if you have not heard from your caregiver or the medical facility. It is important for you to follow up on all of your test results.  SEEK IMMEDIATE MEDICAL ATTENTION IF: You have more than a spotting of  blood in your stool.  Your belly is swollen (abdominal distention).  You are nauseated or vomiting.  You have a temperature over 101.  You have abdominal pain or discomfort that is severe or gets worse throughout the day.   Your EGD revealed mild amount inflammation in your stomach.  I took biopsies of this to rule out infection with a bacteria called H. pylori.  Await pathology results, my office will contact you.  Continue on Pantoprazole daily.  Your colonoscopy was relatively unremarkable.  I did not find any polyps or evidence of colon cancer.  I recommend repeating colonoscopy at age 44 for colon cancer screening purposes.    Follow up with GI in 3 months: office with call with appointment I hope you have a great rest of your week!  Elon Alas. Abbey Chatters, D.O. Gastroenterology and Hepatology [facility]

## 2022-02-09 NOTE — Anesthesia Preprocedure Evaluation (Signed)
Anesthesia Evaluation  Patient identified by MRN, date of birth, ID band Patient awake    Reviewed: Allergy & Precautions, NPO status , Patient's Chart, lab work & pertinent test results, reviewed documented beta blocker date and time   Airway Mallampati: II  TM Distance: >3 FB Neck ROM: Full    Dental  (+) Dental Advisory Given, Chipped   Pulmonary asthma ,    Pulmonary exam normal breath sounds clear to auscultation       Cardiovascular Exercise Tolerance: Good hypertension, Pt. on home beta blockers and Pt. on medications Normal cardiovascular exam Rhythm:Regular Rate:Normal     Neuro/Psych PSYCHIATRIC DISORDERS Depression  Neuromuscular disease    GI/Hepatic Neg liver ROS, GERD  Controlled and Medicated,  Endo/Other  diabetes, Well Controlled, Type 1, Insulin Dependent  Renal/GU ESRF and DialysisRenal disease  negative genitourinary   Musculoskeletal negative musculoskeletal ROS (+)   Abdominal   Peds negative pediatric ROS (+)  Hematology  (+) Blood dyscrasia, anemia ,   Anesthesia Other Findings   Reproductive/Obstetrics negative OB ROS                            Anesthesia Physical  Anesthesia Plan  ASA: 3  Anesthesia Plan: General   Post-op Pain Management: Minimal or no pain anticipated   Induction: Intravenous  PONV Risk Score and Plan: Propofol infusion  Airway Management Planned: Nasal Cannula and Natural Airway  Additional Equipment:   Intra-op Plan:   Post-operative Plan:   Informed Consent: I have reviewed the patients History and Physical, chart, labs and discussed the procedure including the risks, benefits and alternatives for the proposed anesthesia with the patient or authorized representative who has indicated his/her understanding and acceptance.     Dental advisory given  Plan Discussed with: CRNA and Surgeon  Anesthesia Plan Comments:          Anesthesia Quick Evaluation

## 2022-02-09 NOTE — Anesthesia Postprocedure Evaluation (Signed)
Anesthesia Post Note  Patient: [employer]  Procedure(s) Performed: COLONOSCOPY WITH PROPOFOL ESOPHAGOGASTRODUODENOSCOPY (EGD) WITH PROPOFOL BIOPSY  Patient location during evaluation: Phase II Anesthesia Type: General Level of consciousness: awake and alert and oriented Pain management: pain level controlled Vital Signs Assessment: post-procedure vital signs reviewed and stable Respiratory status: spontaneous breathing, nonlabored ventilation and respiratory function stable Cardiovascular status: blood pressure returned to baseline and stable Postop Assessment: no apparent nausea or vomiting Anesthetic complications: no   No notable events documented.   Last Vitals:  Vitals:   02/09/22 0718 02/09/22 0809  BP: (!) 180/116 120/84  Pulse:  83  Resp:  (!) 21  Temp:  (!) 36.4 C  SpO2:  100%    Last Pain:  Vitals:   02/09/22 0809  TempSrc: Axillary  PainSc: 0-No pain                 Amandy Chubbuck C Noble Bodie

## 2022-02-09 NOTE — Op Note (Signed)
[facility] Patient Name: Lindsey Murillo Procedure Date: 02/09/2022 7:09 AM MRN: [id number] Date of Birth: [birth year] Attending MD: Elon Alas. Edgar Frisk CSN: [id number] Age: 26 Admit Type: Outpatient Procedure:                Upper GI endoscopy Indications:              Iron deficiency anemia, Heartburn Providers:                Elon Alas. Abbey Chatters, DO, Tammy Vaught, RN, Everardo Pacific Referring MD:              Medicines:                See the Anesthesia note for documentation of the                            administered medications Complications:            No immediate complications. Estimated Blood Loss:     Estimated blood loss was minimal. Procedure:                Pre-Anesthesia Assessment:                           - The anesthesia plan was to use monitored                            anesthesia care (MAC).                           After obtaining informed consent, the endoscope was                            passed under direct vision. Throughout the                            procedure, the patient's blood pressure, pulse, and                            oxygen saturations were monitored continuously. The                            [id number]-[id number] ([id number]) scope was introduced through the                            mouth, and advanced to the second part of duodenum.                            The upper GI endoscopy was accomplished without                            difficulty. The patient tolerated the procedure                            well. Scope In: 7:45:49 AM  Scope Out: 7:50:11 AM Total Procedure Duration: 0 hours 4 minutes 22 seconds  Findings:      The Z-line was regular.      There is no endoscopic evidence of bleeding, areas of erosion,       ulcerations or varices in the entire esophagus.      Patchy mild inflammation characterized by erythema was found in the       entire examined stomach. Biopsies were taken with a cold  forceps for       Helicobacter pylori testing.      The duodenal bulb, first portion of the duodenum and second portion of       the duodenum were normal. Impression:               - Z-line regular.                           - Gastritis. Biopsied.                           - Normal duodenal bulb, first portion of the                            duodenum and second portion of the duodenum. Moderate Sedation:      Per Anesthesia Care Recommendation:           - Patient has a contact number available for                            emergencies. The signs and symptoms of potential                            delayed complications were discussed with the                            patient. Return to normal activities tomorrow.                            Written discharge instructions were provided to the                            patient.                           - Resume previous diet.                           - Continue present medications.                           - Await pathology results.                           - Use Protonix (pantoprazole) 40 mg PO daily.                           - Return to GI clinic in 3 months. Procedure Code(s):        --- Professional ---  24235, Esophagogastroduodenoscopy, flexible,                            transoral; with biopsy, single or multiple Diagnosis Code(s):        --- Professional ---                           K29.70, Gastritis, unspecified, without bleeding                           D50.9, Iron deficiency anemia, unspecified                           R12, Heartburn CPT copyright 2019 American Medical Association. All rights reserved. The codes documented in this report are preliminary and upon coder review may  be revised to meet current compliance requirements. Elon Alas. Abbey Chatters, DO [place] Abbey Chatters, DO 02/09/2022 7:53:49 AM This report has been signed electronically. Number of Addenda: 0

## 2022-02-09 NOTE — Transfer of Care (Signed)
Immediate Anesthesia Transfer of Care Note  Patient: Lindsey Murillo  Procedure(s) Performed: COLONOSCOPY WITH PROPOFOL ESOPHAGOGASTRODUODENOSCOPY (EGD) WITH PROPOFOL BIOPSY  Patient Location: Short Stay  Anesthesia Type:MAC  Level of Consciousness: awake, alert , oriented and patient cooperative  Airway & Oxygen Therapy: Patient Spontanous Breathing  Post-op Assessment: Report given to RN, Post -op Vital signs reviewed and stable and Patient moving all extremities  Post vital signs: Reviewed and stable  Last Vitals:  Vitals Value Taken Time  BP    Temp    Pulse    Resp    SpO2      Last Pain:  Vitals:   02/09/22 0740  TempSrc:   PainSc: 5          Complications: No notable events documented.

## 2022-02-09 NOTE — Op Note (Signed)
[facility] Patient Name: Lindsey Murillo Procedure Date: 02/09/2022 7:10 AM MRN: [id number] Date of Birth: [birth year] Attending MD: Elon Alas. Edgar Frisk CSN: [id number] Age: 26 Admit Type: Outpatient Procedure:                Colonoscopy Indications:              Heme positive stool, Iron deficiency anemia Providers:                Elon Alas. Abbey Chatters, DO, Tammy Vaught, RN, Everardo Pacific, [place] Risa Grill, Technician Referring MD:              Medicines:                See the Anesthesia note for documentation of the                            administered medications Complications:            No immediate complications. Estimated Blood Loss:     Estimated blood loss: none. Procedure:                Pre-Anesthesia Assessment:                           - The anesthesia plan was to use monitored                            anesthesia care (MAC).                           After obtaining informed consent, the colonoscope                            was passed under direct vision. Throughout the                            procedure, the patient's blood pressure, pulse, and                            oxygen saturations were monitored continuously. The                            [id number]-HQ190L ([id number]) scope was introduced through                            the anus and advanced to the the terminal ileum,                            with identification of the appendiceal orifice and                            IC valve. The colonoscopy was performed without                            difficulty.  The patient tolerated the procedure                            well. The quality of the bowel preparation was                            evaluated using the BBPS [facility] Bowel Preparation                            Scale) with scores of: Right Colon = 3, Transverse                            Colon = 3 and Left Colon = 3 (entire mucosa seen                             well with no residual staining, small fragments of                            stool or opaque liquid). The total BBPS score                            equals 9. Scope In: 7:54:45 AM Scope Out: 8:04:51 AM Scope Withdrawal Time: 0 hours 7 minutes 39 seconds  Total Procedure Duration: 0 hours 10 minutes 6 seconds  Findings:      The perianal and digital rectal examinations were normal.      Non-bleeding internal hemorrhoids were found during endoscopy.      The terminal ileum appeared normal.      The exam was otherwise without abnormality. Impression:               - Non-bleeding internal hemorrhoids.                           - The examined portion of the ileum was normal.                           - The examination was otherwise normal.                           - No specimens collected. Moderate Sedation:      Per Anesthesia Care Recommendation:           - Patient has a contact number available for                            emergencies. The signs and symptoms of potential                            delayed complications were discussed with the                            patient. Return to normal activities tomorrow.                            Written discharge instructions were provided  to the                            patient.                           - Resume previous diet.                           - Continue present medications.                           - Repeat colonoscopy at age 1 or sooner if higher                            risk for screening purposes.                           - Return to GI clinic in 3 months. Procedure Code(s):        --- Professional ---                           [phone], Colonoscopy, flexible; diagnostic, including                            collection of specimen(s) by brushing or washing,                            when performed (separate procedure) Diagnosis Code(s):        --- Professional ---                           K64.8, Other  hemorrhoids                           R19.5, Other fecal abnormalities                           D50.9, Iron deficiency anemia, unspecified CPT copyright 2019 American Medical Association. All rights reserved. The codes documented in this report are preliminary and upon coder review may  be revised to meet current compliance requirements. Elon Alas. Abbey Chatters, DO [name] Abbey Chatters, DO 02/09/2022 8:06:26 AM This report has been signed electronically. Number of Addenda: 0

## 2022-02-09 NOTE — Interval H&P Note (Signed)
History and Physical Interval Note:  02/09/2022 7:38 AM  [name]  has presented today for surgery, with the diagnosis of LUQ pain, dysphagia, gerd, IDA.  The various methods of treatment have been discussed with the patient and family. After consideration of risks, benefits and other options for treatment, the patient has consented to  Procedure(s) with comments: COLONOSCOPY WITH PROPOFOL (N/A) - 7:30am, asa 3, dialysis pt ESOPHAGOGASTRODUODENOSCOPY (EGD) WITH PROPOFOL (N/A) BALLOON DILATION (N/A) as a surgical intervention.  The patient's history has been reviewed, patient examined, no change in status, stable for surgery.  I have reviewed the patient's chart and labs.  Questions were answered to the patient's satisfaction.     Lindsey Murillo

## 2022-02-10 ENCOUNTER — Encounter: Payer: Self-pay | Admitting: Vascular Surgery

## 2022-02-10 ENCOUNTER — Ambulatory Visit (INDEPENDENT_AMBULATORY_CARE_PROVIDER_SITE_OTHER): Payer: Medicare Other | Admitting: Vascular Surgery

## 2022-02-10 VITALS — BP 148/100 | HR 95 | Temp 97.7°F | Ht 67.0 in | Wt 116.0 lb

## 2022-02-10 DIAGNOSIS — N186 End stage renal disease: Secondary | ICD-10-CM

## 2022-02-10 NOTE — Progress Notes (Signed)
   [facility] [place]  Patient name: Lindsey Murillo MRN: [id number] DOB: 11/26/1995 Sex: female  REASON FOR VISIT: Follow-up right second stage basilic vein transposition fistula on 01/12/2022  HPI: Lindsey Murillo is a 26 y.o. female here today for follow-up.  Underwent second stage basilic vein transposition on 01/12/2022 at [facility].  She is here today with her mother for follow-up.  Steal symptoms.  She reports that her catheter is still working well.  Current Outpatient Medications  Medication Sig Dispense Refill   acetaminophen (TYLENOL) 325 MG tablet Take 650 mg by mouth every 6 (six) hours as needed for moderate pain (for pain).     B Complex-C-Folic Acid (DIALYVITE 361) 0.8 MG TABS Take 1 tablet by mouth daily.     blood glucose meter kit and supplies KIT Dispense based on patient and insurance preference. Use up to four times daily as directed. 1 each 0   Blood Glucose Monitoring Suppl (ACCU-CHEK GUIDE ME) w/Device KIT 1 Piece by Does not apply route as directed. 1 kit 0   Continuous Blood Gluc Receiver (FREESTYLE LIBRE 2 READER) DEVI As directed 1 each 0   Continuous Blood Gluc Sensor (FREESTYLE LIBRE 2 SENSOR) MISC 1 Piece by Does not apply route every 14 (fourteen) days. 2 each 3   Cyanocobalamin (B-12 PO) Take 1 capsule by mouth daily.     glucose blood (ACCU-CHEK GUIDE) test strip Use to monitor glucose 4 times a day as instructed 150 each 2   Insulin Glargine (BASAGLAR KWIKPEN) 100 UNIT/ML Inject 14 Units into the skin daily. 15 mL 0   Insulin Pen Needle 31G X 4 MM MISC Use as directed to administer insulin. 100 each 1   Metoprolol Succinate 50 MG CS24 Take 50 mg by mouth at bedtime.     oxyCODONE-acetaminophen (PERCOCET) 5-325 MG tablet Take 1 tablet by mouth every 6 (six) hours as needed for severe pain. 8 tablet 0   pantoprazole (PROTONIX) 40 MG tablet Take 1 tablet (40 mg total) by mouth daily before  breakfast. 30 tablet 3   rosuvastatin (CRESTOR) 10 MG tablet Take 1 tablet by mouth daily.     sucroferric oxyhydroxide (VELPHORO) 500 MG chewable tablet Chew 500 mg by mouth 3 (three) times daily with meals.     No current facility-administered medications for this visit.     PHYSICAL EXAM: Vitals:   02/10/22 0912  BP: (!) 148/100  Pulse: 95  Temp: 97.7 F (36.5 C)  SpO2: 100%  Weight: 116 lb (52.6 kg)  Height: $Remove'5\' 7"'yyvNafj$  (1.702 m)    GENERAL: The patient is a well-nourished female, in no acute distress. The vital signs are documented above. Plus right radial pulse.  Excellent thrill and excellent size maturation in her right basilic vein transposition fistula.  The vein runs very superficial to the skin and in a straight course.  MEDICAL ISSUES: Stable status post second stage basilic vein transposition on 01/12/2022.  I would suggest waiting an additional 2 weeks to allow 6 weeks following transposition and then begin using her fistula.  I am available for removal of catheter desired at [facility] following successful only use of the fistula   [name], MD [facility] [phone]  Note: Portions of this report may have been transcribed using voice recognition software.  Every effort has been made to ensure accuracy; however, inadvertent computerized transcription errors may still be present.

## 2022-02-11 ENCOUNTER — Ambulatory Visit (HOSPITAL_COMMUNITY): Payer: Medicare Other | Attending: Family Medicine | Admitting: Physical Therapy

## 2022-02-11 ENCOUNTER — Encounter (HOSPITAL_COMMUNITY): Payer: Self-pay | Admitting: Physical Therapy

## 2022-02-11 DIAGNOSIS — R2689 Other abnormalities of gait and mobility: Secondary | ICD-10-CM | POA: Insufficient documentation

## 2022-02-11 DIAGNOSIS — R600 Localized edema: Secondary | ICD-10-CM | POA: Diagnosis not present

## 2022-02-11 DIAGNOSIS — M6281 Muscle weakness (generalized): Secondary | ICD-10-CM | POA: Insufficient documentation

## 2022-02-11 LAB — SURGICAL PATHOLOGY

## 2022-02-11 NOTE — Therapy (Signed)
OUTPATIENT PHYSICAL THERAPY LOWER EXTREMITY EVALUATION   Patient Name: Lindsey Murillo MRN: [id number] DOB:1996-03-18, 26 y.o., female Today's Date: 02/11/2022   PT End of Session - 02/11/22 1634     Visit Number 2    Number of Visits 8    Date for PT Re-Evaluation 03/02/22    Authorization Type [employer]; no auth, 72 visit limit PT/ OT/ ST    Authorization - Visit Number 2    Authorization - Number of Visits 35    PT Start Time 1634    PT Stop Time 1713    PT Time Calculation (min) 39 min    Activity Tolerance Patient tolerated treatment well    Behavior During Therapy WFL for tasks assessed/performed             Past Medical History:  Diagnosis Date   Acanthosis nigricans, acquired    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 chronic kidney disease ([place]) 09/25/2021   dialysis M-W-F at [facility] care in [place]   Asthma    Diabetic autonomic neuropathy ([name])    Diabetic peripheral neuropathy ([place])    Environmental allergies    Goiter    History of blood transfusion    Hypertension    no meds currently   Hypoglycemia associated with diabetes ([place])    Tachycardia    Thyroiditis, autoimmune    Type 1 diabetes mellitus in patient age 35-19 years with HbA1C goal below 7.5    Past Surgical History:  Procedure Laterality Date   AV FISTULA PLACEMENT Right 12/08/2021   Procedure: ARTERIOVENOUS  FISTULA CREATION VERSUS GRAFT;  Surgeon: [name], MD;  Location: [facility];  Service: Vascular;  Laterality: Right;   [place] Right 01/12/2022   Procedure: RIGHT ARM SECOND STAGE BASILIC VEIN TRANSPOSITION;  Surgeon: [name], MD;  Location: [facility];  Service: Vascular;  Laterality: Right;   BIOPSY  08/27/2016   Procedure: BIOPSY;  Surgeon: Danie Binder, MD;  Location: [facility];  Service: Endoscopy;;  duodenum; gastric   BIOPSY  06/27/2019   Procedure: BIOPSY;  Surgeon: Daneil Dolin, MD;  Location: [facility];  Service: Endoscopy;;   BREAST CYST  EXCISION Right 11/12/2021   Procedure: RIGHT BREAST ABSCESS INCISION AND DRAINAGE;  Surgeon: Rolm Bookbinder, MD;  Location: [place];  Service: General;  Laterality: Right;  LMA   COLONOSCOPY     ESOPHAGOGASTRODUODENOSCOPY N/A 08/27/2016   Dr. Oneida Alar: mild gastritis. Negative celiac. No obvious source for dyspepsia/diarrhea   ESOPHAGOGASTRODUODENOSCOPY (EGD) WITH PROPOFOL N/A 06/27/2019   rourk: Focal abnormality of the gastric mucosa likely due to trauma (heaving).  Biopsy showed mild gastritis, negative for H. pylori.  Esophageal dilation for history of dysphagia but normal-appearing esophagus.   IR FLUORO GUIDE CV LINE RIGHT  10/13/2021   IR FLUORO GUIDE CV LINE RIGHT  10/19/2021   IR US GUIDE VASC ACCESS RIGHT  10/13/2021   IRRIGATION AND DEBRIDEMENT ABSCESS N/A 10/04/2021   Procedure: IRRIGATION AND DEBRIDEMENT NECK ABSCESS;  Surgeon: Ralene Ok, MD;  Location: [place];  Service: General;  Laterality: N/A;   Patient Active Problem List   Diagnosis Date Noted   Non-adherence to medical treatment 01/21/2022   Esophageal dysphagia 01/19/2022   ESRD (end stage renal disease) ([place]) 12/18/2021   Abscess of right breast 12/18/2021   Physical deconditioning 10/07/2021   Protein-calorie malnutrition, severe ([place]) 10/02/2021   Depression 09/25/2021   Anemia in ESRD (end-stage renal disease) ([place]) 09/25/2021   Normocytic  anemia 06/26/2021   Peripheral edema 06/13/2021   Iron deficiency anemia 06/02/2021   Nephrotic syndrome 05/27/2021   Chronic pancreatitis ([place]) 08/22/2019   Pancreatic pseudocyst/cyst 08/22/2019   GERD (gastroesophageal reflux disease) 08/22/2019   Vitamin D deficiency 03/20/2019   Uncontrolled type 1 diabetes mellitus with hyperglycemia ([place]) 05/12/2015   Essential hypertension, benign 11/30/2012   Diabetic peripheral neuropathy ([place])    Asthma     PCP: Coral Spikes, DO [facility]   REFERRING PROVIDER: Coral Spikes, DO [facility]    REFERRING DIAG: R26.9 (ICD-10-CM) - Gait difficulty   THERAPY DIAG:  Other abnormalities of gait and mobility  Muscle weakness (generalized)  Localized edema  Rationale for Evaluation and Treatment Rehabilitation  ONSET DATE: July 2023  SUBJECTIVE:   SUBJECTIVE STATEMENT: Patient states she is doing better with dialysis. She has less fluid in her legs.   PERTINENT HISTORY: Started dialysis July Monday wed and Friday Fistula right side of chest and then will eventually use right arm where new fistula placed  PAIN:  Are you having pain? Yes: NPRS scale: 5/10 Pain location: mid back  Pain description: sore Aggravating factors: unknown Relieving factors: unknown  PRECAUTIONS: Fall  WEIGHT BEARING RESTRICTIONS No  FALLS:  Has patient fallen in last 6 months? Yes. Number of falls 2  LIVING ENVIRONMENT: Lives with: lives with their family Lives in: House/apartment Stairs: Yes: External: 2 steps; none Has following equipment at home: Single point cane, Walker - 2 wheeled, and bed side commode  OCCUPATION: disability  PLOF: Independent with basic ADLs  PATIENT GOALS get my legs stronger   OBJECTIVE:   DIAGNOSTIC FINDINGS: no recent  PATIENT SURVEYS:  LEFS 46/80  COGNITION:  Overall cognitive status: Within functional limits for tasks assessed     SENSATION: WFL  EDEMA:  Legs much smaller than previous visits    LOWER EXTREMITY MMT:  MMT Right eval Left eval  Hip flexion 3- 3-  Hip extension    Hip abduction    Hip adduction    Hip internal rotation    Hip external rotation    Knee flexion    Knee extension 4 4  Ankle dorsiflexion 3+ 4  Ankle plantarflexion    Ankle inversion    Ankle eversion     (Blank rows = not tested)  FUNCTIONAL TESTS:  5 times sit to stand: 31.94 sec uses hands to assist 2 minute walk test: 220 ft  GAIT: Distance walked: 220 ft Assistive device utilized: Single point cane Level of assistance: SBA Comments:  increased lateral trunk movement noted; uses SPC left hand    TODAY'S TREATMENT: 02/11/22 Bridge 2 x 10 SLR 2 x 10 each  Sidelying hip abduction 2 x 10  Seated march x20 Seated heel/ toe raise x20 Seated LAQ 2 x 10   Heel raise 2 x 10 Sit to stand 2 x 10 with UE assist   Physical therapy evaluation and HEP instruction   PATIENT EDUCATION:  Education details: Patient educated on exam findings, POC, scope of PT, HEP. Person educated: Patient Education method: Explanation, Demonstration, and Handouts Education comprehension: verbalized understanding, returned demonstration, verbal cues required, and tactile cues required    HOME EXERCISE PROGRAM: Access Code: [email or web address] URL: [email or web address]  02/11/22 - Supine Bridge  - 2 x daily - 7 x weekly - 2 sets - 10 reps - Small Range Straight Leg Raise  - 2 x daily - 7 x weekly - 2 sets - 10 reps -  Sidelying Hip Abduction  - 2 x daily - 7 x weekly - 2 sets - 10 reps - Standing Heel Raise with Support  - 2 x daily - 7 x weekly - 2 sets - 10 reps  Date: 02/02/2022 Prepared by: [facility] - Rehab  Exercises - Sit to Stand with Armchair  - 2 x daily - 7 x weekly - 1 sets - 10 reps - Seated March  - 2 x daily - 7 x weekly - 1 sets - 10 reps - Seated Heel Toe Raises  - 2 x daily - 7 x weekly - 1 sets - 10 reps - Seated Long Arc Quad  - 2 x daily - 7 x weekly - 1 sets - 10 reps  ASSESSMENT:  CLINICAL IMPRESSION: Reviewed goals and HEP. Patient tolerated session well today. Progressed table exercise as well as standing exercise for LE strengthening. Patient educated on purpose and function of all added exercises. Continues to be limited by decreased activity tolerance and functional weakness. Updated and issued HEP handout. Patient will continue to benefit from skilled therapy services to reduce remaining deficits and improve functional ability.    OBJECTIVE IMPAIRMENTS Abnormal gait, decreased activity tolerance, decreased  balance, decreased coordination, decreased endurance, decreased knowledge of use of DME, decreased mobility, difficulty walking, decreased ROM, decreased strength, decreased safety awareness, hypomobility, impaired perceived functional ability, impaired flexibility, and pain.   ACTIVITY LIMITATIONS carrying, lifting, bending, sitting, standing, squatting, sleeping, stairs, transfers, bed mobility, bathing, locomotion level, and caring for others  PARTICIPATION LIMITATIONS: meal prep, cleaning, laundry, driving, shopping, and community activity    REHAB POTENTIAL: Good  CLINICAL DECISION MAKING: Stable/uncomplicated  EVALUATION COMPLEXITY: Low   GOALS: Goals reviewed with patient? No  SHORT TERM GOALS: Target date: 02/16/2022   patient will be independent with initial HEP Baseline: Goal status: INITIAL  2.  Patient will improve 5 x STS score from 30.94 sec to 25 sec to demonstrate improved functional mobility and increased lower extremity strength.  Baseline:  Goal status: INITIAL   LONG TERM GOALS: Target date: 03/02/2022   Patient will be independent in self management strategies to improve quality of life and functional outcomes.  Baseline:  Goal status: INITIAL  2.  Patient will improve 5 x STS score from 30.94 sec to 20 sec to demonstrate improved functional mobility and increased lower extremity strength.  Baseline:  Goal status: INITIAL  3.  Patient will report at least 50% improvement in overall symptoms and/or function to demonstrate improved functional mobility  Baseline:  Goal status: INITIAL  4.  Patient will increase 2 MWT distance to 300 ft to demonstrate improved functional mobility/ ambulation in community Baseline: 220 ft Goal status: INITIAL  5.  Patient will increase hip flexion strength to 4/5 to be able to improve efficiency of swing through phase with ambulation. Baseline: 3- Goal status: INITIAL   PLAN: PT FREQUENCY: 2x/week  PT  DURATION: 4 weeks  PLANNED INTERVENTIONS: Therapeutic exercises, Therapeutic activity, Neuromuscular re-education, Balance training, Gait training, Patient/Family education, Joint manipulation, Joint mobilization, Stair training, Orthotic/Fit training, DME instructions, Aquatic Therapy, Dry Needling, Electrical stimulation, Spinal manipulation, Spinal mobilization, Cryotherapy, Moist heat, Compression bandaging, scar mobilization, Splintting, Taping, Traction, Ultrasound, Ionotophoresis 4mg /ml Dexamethasone, and Manual therapy  PLAN FOR NEXT SESSION: Hip and lower extremity strengthening; patient with port right anterior chest wall and new fistula right upper extremity  4:35 PM, 02/11/22 Josue Hector PT DPT  Physical Therapist with [facility]  [phone]  [phone]  

## 2022-02-15 ENCOUNTER — Encounter (HOSPITAL_COMMUNITY): Payer: Self-pay | Admitting: Internal Medicine

## 2022-02-16 ENCOUNTER — Encounter (HOSPITAL_COMMUNITY): Payer: Medicare Other | Admitting: Physical Therapy

## 2022-02-18 ENCOUNTER — Ambulatory Visit (HOSPITAL_COMMUNITY): Payer: Medicare Other | Admitting: Physical Therapy

## 2022-02-18 ENCOUNTER — Encounter (HOSPITAL_COMMUNITY): Payer: Self-pay | Admitting: Physical Therapy

## 2022-02-18 DIAGNOSIS — M6281 Muscle weakness (generalized): Secondary | ICD-10-CM | POA: Diagnosis not present

## 2022-02-18 DIAGNOSIS — R2689 Other abnormalities of gait and mobility: Secondary | ICD-10-CM | POA: Diagnosis not present

## 2022-02-18 DIAGNOSIS — R600 Localized edema: Secondary | ICD-10-CM | POA: Diagnosis not present

## 2022-02-18 NOTE — Therapy (Signed)
OUTPATIENT PHYSICAL THERAPY LOWER EXTREMITY EVALUATION   Patient Name: Lindsey Murillo MRN: [id number] DOB:04-Feb-1996, 26 y.o., female Today's Date: 02/18/2022   PT End of Session - 02/18/22 1648     Visit Number 3    Number of Visits 8    Date for PT Re-Evaluation 03/02/22    Authorization Type A[employer]; no auth, 35 visit limit PT/ OT/ ST    Authorization - Visit Number 3    Authorization - Number of Visits 27    PT Start Time 1761    PT Stop Time 6073    PT Time Calculation (min) 38 min    Activity Tolerance Patient tolerated treatment well    Behavior During Therapy WFL for tasks assessed/performed             Past Medical History:  Diagnosis Date   Acanthosis nigricans, acquired    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 chronic kidney disease ([place]) 09/25/2021   dialysis M-W-F at Dialysis at [facility] kidney care in [place]   Asthma    Diabetic autonomic neuropathy ([place])    Diabetic peripheral neuropathy ([place])    Environmental allergies    Goiter    History of blood transfusion    Hypertension    no meds currently   Hypoglycemia associated with diabetes ([place])    Tachycardia    Thyroiditis, autoimmune    Type 1 diabetes mellitus in patient age 36-19 years with HbA1C goal below 7.5    Past Surgical History:  Procedure Laterality Date   AV FISTULA PLACEMENT Right 12/08/2021   Procedure: ARTERIOVENOUS  FISTULA CREATION VERSUS GRAFT;  Surgeon: [name], MD;  Location: [facility];  Service: Vascular;  Laterality: Right;   [place] Right 01/12/2022   Procedure: RIGHT ARM SECOND STAGE BASILIC VEIN TRANSPOSITION;  Surgeon: [name], MD;  Location: [facility];  Service: Vascular;  Laterality: Right;   BIOPSY  08/27/2016   Procedure: BIOPSY;  Surgeon: Danie Binder, MD;  [facility];  Service: Endoscopy;;  duodenum; gastric   BIOPSY  06/27/2019   Procedure: BIOPSY;  Surgeon: Daneil Dolin, MD;  [facility];  Service: Endoscopy;;   BIOPSY   02/09/2022   Procedure: BIOPSY;  Surgeon: Eloise Harman, DO;  [facility];  Service: Endoscopy;;   BREAST CYST EXCISION Right 11/12/2021   Procedure: RIGHT BREAST ABSCESS INCISION AND DRAINAGE;  Surgeon: Rolm Bookbinder, MD;  Location: [place];  Service: General;  Laterality: Right;  LMA   COLONOSCOPY     COLONOSCOPY WITH PROPOFOL N/A 02/09/2022   Procedure: COLONOSCOPY WITH PROPOFOL;  Surgeon: Eloise Harman, DO;  [facility];  Service: Endoscopy;  Laterality: N/A;  7:30am, asa 3, dialysis pt   ESOPHAGOGASTRODUODENOSCOPY N/A 08/27/2016   Dr. Oneida Alar: mild gastritis. Negative celiac. No obvious source for dyspepsia/diarrhea   ESOPHAGOGASTRODUODENOSCOPY (EGD) WITH PROPOFOL N/A 06/27/2019   rourk: Focal abnormality of the gastric mucosa likely due to trauma (heaving).  Biopsy showed mild gastritis, negative for H. pylori.  Esophageal dilation for history of dysphagia but normal-appearing esophagus.   ESOPHAGOGASTRODUODENOSCOPY (EGD) WITH PROPOFOL N/A 02/09/2022   Procedure: ESOPHAGOGASTRODUODENOSCOPY (EGD) WITH PROPOFOL;  Surgeon: Eloise Harman, DO;  [facility];  Service: Endoscopy;  Laterality: N/A;   IR FLUORO GUIDE CV LINE RIGHT  10/13/2021   IR FLUORO GUIDE CV LINE RIGHT  10/19/2021   IR US GUIDE VASC ACCESS RIGHT  10/13/2021   IRRIGATION AND DEBRIDEMENT ABSCESS N/A 10/04/2021  Procedure: IRRIGATION AND DEBRIDEMENT NECK ABSCESS;  Surgeon: Ralene Ok, MD;  Location: [place];  Service: General;  Laterality: N/A;   Patient Active Problem List   Diagnosis Date Noted   Non-adherence to medical treatment 01/21/2022   Esophageal dysphagia 01/19/2022   ESRD (end stage renal disease) ([place]) 12/18/2021   Abscess of right breast 12/18/2021   Physical deconditioning 10/07/2021   Protein-calorie malnutrition, severe ([place]) 10/02/2021   Depression 09/25/2021   Anemia in ESRD (end-stage renal disease) ([place]) 09/25/2021   Normocytic anemia 06/26/2021   Peripheral  edema 06/13/2021   Iron deficiency anemia 06/02/2021   Nephrotic syndrome 05/27/2021   Chronic pancreatitis ([place]) 08/22/2019   Pancreatic pseudocyst/cyst 08/22/2019   GERD (gastroesophageal reflux disease) 08/22/2019   Vitamin D deficiency 03/20/2019   Uncontrolled type 1 diabetes mellitus with hyperglycemia ([place]) 05/12/2015   Essential hypertension, benign 11/30/2012   Diabetic peripheral neuropathy ([place])    Asthma     PCP: Coral Spikes, DO [facility]   REFERRING PROVIDER: Coral Spikes, DO [facility]   REFERRING DIAG: R26.9 (ICD-10-CM) - Gait difficulty   THERAPY DIAG:  Other abnormalities of gait and mobility  Muscle weakness (generalized)  Rationale for Evaluation and Treatment Rehabilitation  ONSET DATE: July 2023  SUBJECTIVE:   SUBJECTIVE STATEMENT: Doing ok, still just some pain in back and legs.   PERTINENT HISTORY: Started dialysis July Monday wed and Friday Fistula right side of chest and then will eventually use right arm where new fistula placed  PAIN:  Are you having pain? Yes: NPRS scale: 5/10 Pain location: back Pain description: sore Aggravating factors: unknown Relieving factors: unknown  PRECAUTIONS: Fall  WEIGHT BEARING RESTRICTIONS No  FALLS:  Has patient fallen in last 6 months? Yes. Number of falls 2  LIVING ENVIRONMENT: Lives with: lives with their family Lives in: House/apartment Stairs: Yes: External: 2 steps; none Has following equipment at home: Single point cane, Walker - 2 wheeled, and bed side commode  OCCUPATION: disability  PLOF: Independent with basic ADLs  PATIENT GOALS get my legs stronger   OBJECTIVE:   DIAGNOSTIC FINDINGS: no recent  PATIENT SURVEYS:  LEFS 46/80  COGNITION:  Overall cognitive status: Within functional limits for tasks assessed     SENSATION: WFL  EDEMA:  Legs much smaller than previous visits    LOWER EXTREMITY MMT:  MMT Right eval Left eval   Hip flexion 3- 3-  Hip extension    Hip abduction    Hip adduction    Hip internal rotation    Hip external rotation    Knee flexion    Knee extension 4 4  Ankle dorsiflexion 3+ 4  Ankle plantarflexion    Ankle inversion    Ankle eversion     (Blank rows = not tested)  FUNCTIONAL TESTS:  5 times sit to stand: 31.94 sec uses hands to assist 2 minute walk test: 220 ft  GAIT: Distance walked: 220 ft Assistive device utilized: Single point cane Level of assistance: SBA Comments: increased lateral trunk movement noted; uses SPC left hand    TODAY'S TREATMENT: 02/18/22 Nustep Lv 3 5 min for strength and conditioning (seat 8)  Heel raise 2 x 10   Toe raise (unable to clear toes from floor)  Step up 4 inch 2 x10 Sit to stand 2 x 10 Standing hip abduction RTB 2 x 10 Standing hip extension RTB 2 x 10 Semi tandem stance 3  x 30"  LAQ 10 x 5"  each   02/11/22 Bridge 2 x 10 SLR 2 x 10 each  Sidelying hip abduction 2 x 10  Seated march x20 Seated heel/ toe raise x20 Seated LAQ 2 x 10   Heel raise 2 x 10 Sit to stand 2 x 10 with UE assist   Physical therapy evaluation and HEP instruction   PATIENT EDUCATION:  Education details: Patient educated on exam findings, POC, scope of PT, HEP. Person educated: Patient Education method: Explanation, Demonstration, and Handouts Education comprehension: verbalized understanding, returned demonstration, verbal cues required, and tactile cues required    HOME EXERCISE PROGRAM: Access Code: [email or web address] URL: [email or web address]  02/18/22 - Hip Abduction with Resistance Loop  - 2 x daily - 7 x weekly - 2 sets - 10 reps - Hip Extension with Resistance Loop  - 2 x daily - 7 x weekly - 2 sets - 10 reps - Tandem Stance with Support  - 2 x daily - 7 x weekly - 1 sets - 3 reps - 30 seconds hold   02/11/22 - Supine Bridge  - 2 x daily - 7 x weekly - 2 sets - 10 reps - Small Range Straight Leg Raise  - 2 x daily - 7 x  weekly - 2 sets - 10 reps - Sidelying Hip Abduction  - 2 x daily - 7 x weekly - 2 sets - 10 reps - Standing Heel Raise with Support  - 2 x daily - 7 x weekly - 2 sets - 10 reps  Date: 02/02/2022 Prepared by: [facility] - [facility]  Exercises - Sit to Stand with Armchair  - 2 x daily - 7 x weekly - 1 sets - 10 reps - Seated March  - 2 x daily - 7 x weekly - 1 sets - 10 reps - Seated Heel Toe Raises  - 2 x daily - 7 x weekly - 1 sets - 10 reps - Seated Long Arc Quad  - 2 x daily - 7 x weekly - 1 sets - 10 reps  ASSESSMENT:  CLINICAL IMPRESSION: Patient tolerated session well. Progressed to standing LE strengthening. Patient challenged with addition of red band to hip abduction and extension, noting increased muscle fatigue. Patient showing difficulty keeping balance with tandem stance and modified activity to semi tandem position. Balance did improve with practice. Patient continues to be limited by decreased balance and LE weakness. Updated and issued HEP handout. Patient will continue to benefit from skilled therapy services to reduce remaining deficits and improve functional ability.    OBJECTIVE IMPAIRMENTS Abnormal gait, decreased activity tolerance, decreased balance, decreased coordination, decreased endurance, decreased knowledge of use of DME, decreased mobility, difficulty walking, decreased ROM, decreased strength, decreased safety awareness, hypomobility, impaired perceived functional ability, impaired flexibility, and pain.   ACTIVITY LIMITATIONS carrying, lifting, bending, sitting, standing, squatting, sleeping, stairs, transfers, bed mobility, bathing, locomotion level, and caring for others  PARTICIPATION LIMITATIONS: meal prep, cleaning, laundry, driving, shopping, and community activity    REHAB POTENTIAL: Good  CLINICAL DECISION MAKING: Stable/uncomplicated  EVALUATION COMPLEXITY: Low   GOALS: Goals reviewed with patient? No  SHORT TERM GOALS: Target date: 02/16/2022    patient will be independent with initial HEP Baseline: Goal status: INITIAL  2.  Patient will improve 5 x STS score from 30.94 sec to 25 sec to demonstrate improved functional mobility and increased lower extremity strength.  Baseline:  Goal status: INITIAL   LONG TERM GOALS: Target date: 03/02/2022   Patient will be independent in self management  strategies to improve quality of life and functional outcomes.  Baseline:  Goal status: INITIAL  2.  Patient will improve 5 x STS score from 30.94 sec to 20 sec to demonstrate improved functional mobility and increased lower extremity strength.  Baseline:  Goal status: INITIAL  3.  Patient will report at least 50% improvement in overall symptoms and/or function to demonstrate improved functional mobility  Baseline:  Goal status: INITIAL  4.  Patient will increase 2 MWT distance to 300 ft to demonstrate improved functional mobility/ ambulation in community Baseline: 220 ft Goal status: INITIAL  5.  Patient will increase hip flexion strength to 4/5 to be able to improve efficiency of swing through phase with ambulation. Baseline: 3- Goal status: INITIAL   PLAN: PT FREQUENCY: 2x/week  PT DURATION: 4 weeks  PLANNED INTERVENTIONS: Therapeutic exercises, Therapeutic activity, Neuromuscular re-education, Balance training, Gait training, Patient/Family education, Joint manipulation, Joint mobilization, Stair training, Orthotic/Fit training, DME instructions, Aquatic Therapy, Dry Needling, Electrical stimulation, Spinal manipulation, Spinal mobilization, Cryotherapy, Moist heat, Compression bandaging, scar mobilization, Splintting, Taping, Traction, Ultrasound, Ionotophoresis 4mg /ml Dexamethasone, and Manual therapy  PLAN FOR NEXT SESSION: Hip and lower extremity strengthening; patient with port right anterior chest wall and new fistula right upper extremity  4:48 PM, 02/18/22 Josue Hector PT DPT  Physical Therapist with [facility]  [phone]

## 2022-02-23 ENCOUNTER — Encounter (HOSPITAL_COMMUNITY): Payer: Medicare Other | Admitting: Physical Therapy

## 2022-02-25 ENCOUNTER — Encounter (HOSPITAL_COMMUNITY): Payer: Self-pay

## 2022-02-25 ENCOUNTER — Ambulatory Visit (HOSPITAL_COMMUNITY): Payer: Medicare Other

## 2022-02-25 DIAGNOSIS — M6281 Muscle weakness (generalized): Secondary | ICD-10-CM | POA: Diagnosis not present

## 2022-02-25 DIAGNOSIS — R2689 Other abnormalities of gait and mobility: Secondary | ICD-10-CM | POA: Diagnosis not present

## 2022-02-25 DIAGNOSIS — R600 Localized edema: Secondary | ICD-10-CM | POA: Diagnosis not present

## 2022-02-25 NOTE — Therapy (Signed)
OUTPATIENT PHYSICAL THERAPY LOWER EXTREMITY TREATMENT   Patient Name: Lindsey Murillo MRN: [id number] DOB:Feb 08, 1996, 26 y.o., female Today's Date: 02/25/2022   PT End of Session - 02/25/22 1657     Visit Number 4    Number of Visits 8    Date for PT Re-Evaluation 03/02/22    Authorization Type Aetna; no auth, 35 visit limit PT/ OT/ ST    Authorization - Visit Number 4    Authorization - Number of Visits 26    PT Start Time 4098   late sign in   Activity Tolerance Patient tolerated treatment well    Behavior During Therapy [facility] for tasks assessed/performed             Past Medical History:  Diagnosis Date   Acanthosis nigricans, acquired    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 chronic kidney disease ([place]) 09/25/2021   dialysis M-W-F at Dialysis at [facility] kidney care in [place]   Asthma    Diabetic autonomic neuropathy ([place])    Diabetic peripheral neuropathy ([place])    Environmental allergies    Goiter    History of blood transfusion    Hypertension    no meds currently   Hypoglycemia associated with diabetes ([place])    Tachycardia    Thyroiditis, autoimmune    Type 1 diabetes mellitus in patient age 57-19 years with HbA1C goal below 7.5    Past Surgical History:  Procedure Laterality Date   AV FISTULA PLACEMENT Right 12/08/2021   Procedure: ARTERIOVENOUS  FISTULA CREATION VERSUS GRAFT;  Surgeon: [name], MD;  Location: [facility];  Service: Vascular;  Laterality: Right;   [place] Right 01/12/2022   Procedure: RIGHT ARM SECOND STAGE BASILIC VEIN TRANSPOSITION;  Surgeon: [name], MD;  Location: [facility];  Service: Vascular;  Laterality: Right;   BIOPSY  08/27/2016   Procedure: BIOPSY;  Surgeon: Danie Binder, MD;  [facility];  Service: Endoscopy;;  duodenum; gastric   BIOPSY  06/27/2019   Procedure: BIOPSY;  Surgeon: Daneil Dolin, MD;  [facility];  Service: Endoscopy;;   BIOPSY  02/09/2022   Procedure: BIOPSY;  Surgeon: Eloise Harman, DO;  [facility];  Service: Endoscopy;;   BREAST CYST EXCISION Right 11/12/2021   Procedure: RIGHT BREAST ABSCESS INCISION AND DRAINAGE;  Surgeon: Rolm Bookbinder, MD;  Location: [place];  Service: General;  Laterality: Right;  LMA   COLONOSCOPY     COLONOSCOPY WITH PROPOFOL N/A 02/09/2022   Procedure: COLONOSCOPY WITH PROPOFOL;  Surgeon: Eloise Harman, DO;  [facility];  Service: Endoscopy;  Laterality: N/A;  7:30am, asa 3, dialysis pt   ESOPHAGOGASTRODUODENOSCOPY N/A 08/27/2016   Dr. Oneida Alar: mild gastritis. Negative celiac. No obvious source for dyspepsia/diarrhea   ESOPHAGOGASTRODUODENOSCOPY (EGD) WITH PROPOFOL N/A 06/27/2019   rourk: Focal abnormality of the gastric mucosa likely due to trauma (heaving).  Biopsy showed mild gastritis, negative for H. pylori.  Esophageal dilation for history of dysphagia but normal-appearing esophagus.   ESOPHAGOGASTRODUODENOSCOPY (EGD) WITH PROPOFOL N/A 02/09/2022   Procedure: ESOPHAGOGASTRODUODENOSCOPY (EGD) WITH PROPOFOL;  Surgeon: Eloise Harman, DO;  [facility];  Service: Endoscopy;  Laterality: N/A;   IR FLUORO GUIDE CV LINE RIGHT  10/13/2021   IR FLUORO GUIDE CV LINE RIGHT  10/19/2021   IR US GUIDE VASC ACCESS RIGHT  10/13/2021   IRRIGATION AND DEBRIDEMENT ABSCESS N/A 10/04/2021   Procedure: IRRIGATION AND DEBRIDEMENT NECK ABSCESS;  Surgeon: Ralene Ok,  MD;  Location: [place];  Service: General;  Laterality: N/A;   Patient Active Problem List   Diagnosis Date Noted   Non-adherence to medical treatment 01/21/2022   Esophageal dysphagia 01/19/2022   ESRD (end stage renal disease) ([place]) 12/18/2021   Abscess of right breast 12/18/2021   Physical deconditioning 10/07/2021   Protein-calorie malnutrition, severe ([place]) 10/02/2021   Depression 09/25/2021   Anemia in ESRD (end-stage renal disease) ([place]) 09/25/2021   Normocytic anemia 06/26/2021   Peripheral edema 06/13/2021   Iron deficiency anemia  06/02/2021   Nephrotic syndrome 05/27/2021   Chronic pancreatitis ([place]) 08/22/2019   Pancreatic pseudocyst/cyst 08/22/2019   GERD (gastroesophageal reflux disease) 08/22/2019   Vitamin D deficiency 03/20/2019   Uncontrolled type 1 diabetes mellitus with hyperglycemia ([place]) 05/12/2015   Essential hypertension, benign 11/30/2012   Diabetic peripheral neuropathy ([place])    Asthma     PCP: Coral Spikes, DO [facility]   REFERRING PROVIDER: Coral Spikes, DO [facility]   REFERRING DIAG: R26.9 (ICD-10-CM) - Gait difficulty   THERAPY DIAG:  Other abnormalities of gait and mobility  Muscle weakness (generalized)  Localized edema  Rationale for Evaluation and Treatment Rehabilitation  ONSET DATE: July 2023  SUBJECTIVE:   SUBJECTIVE STATEMENT: Reports receiving dialysis 3 days a week and swelling has resolved BLE.  No reports of pain or recent fall.  Arrived with her mother who sat through session today.    PERTINENT HISTORY: Started dialysis July Monday wed and Friday Fistula right side of chest and then will eventually use right arm where new fistula placed  PAIN:  Are you having pain? Yes: NPRS scale: 5/10 Pain location: back Pain description: sore Aggravating factors: unknown Relieving factors: unknown  PRECAUTIONS: Fall  WEIGHT BEARING RESTRICTIONS No  FALLS:  Has patient fallen in last 6 months? Yes. Number of falls 2  LIVING ENVIRONMENT: Lives with: lives with their family Lives in: House/apartment Stairs: Yes: External: 2 steps; none Has following equipment at home: Single point cane, Walker - 2 wheeled, and bed side commode  OCCUPATION: disability  PLOF: Independent with basic ADLs  PATIENT GOALS get my legs stronger   OBJECTIVE:   DIAGNOSTIC FINDINGS: no recent  PATIENT SURVEYS:  LEFS 46/80  COGNITION:  Overall cognitive status: Within functional limits for tasks assessed     SENSATION: WFL  EDEMA:  Legs much  smaller than previous visits    LOWER EXTREMITY MMT:  MMT Right eval Left eval  Hip flexion 3- 3-  Hip extension    Hip abduction    Hip adduction    Hip internal rotation    Hip external rotation    Knee flexion    Knee extension 4 4  Ankle dorsiflexion 3+ 4  Ankle plantarflexion    Ankle inversion    Ankle eversion     (Blank rows = not tested)  FUNCTIONAL TESTS:  5 times sit to stand: 31.94 sec uses hands to assist 2 minute walk test: 220 ft  GAIT: Distance walked: 220 ft Assistive device utilized: Single point cane Level of assistance: SBA Comments: increased lateral trunk movement noted; uses SPC left hand    TODAY'S TREATMENT: 02/25/22: 10 STS from 25in height no HHA Heel raise 2x 10 Sidestep RTB 2RT in hallway Retro gait 2RT in hallway with RTB around thigh Toe tapping 6in 10x 2 sets Squats 15x NBOS on foam paloff GTB 20x 2  02/18/22 Nustep Lv 3 5 min for strength and conditioning (seat  8)  Heel raise 2 x 10   Toe raise (unable to clear toes from floor)  Step up 4 inch 2 x10 Sit to stand 2 x 10 Standing hip abduction RTB 2 x 10 Standing hip extension RTB 2 x 10 Semi tandem stance 3  x 30"  LAQ 10 x 5" each   02/11/22 Bridge 2 x 10 SLR 2 x 10 each  Sidelying hip abduction 2 x 10  Seated march x20 Seated heel/ toe raise x20 Seated LAQ 2 x 10   Heel raise 2 x 10 Sit to stand 2 x 10 with UE assist   Physical therapy evaluation and HEP instruction   PATIENT EDUCATION:  Education details: Patient educated on exam findings, POC, scope of PT, HEP. Person educated: Patient Education method: Explanation, Demonstration, and Handouts Education comprehension: verbalized understanding, returned demonstration, verbal cues required, and tactile cues required    HOME EXERCISE PROGRAM: Access Code: [email or web address] URL: [email or web address]  02/18/22 - Hip Abduction with Resistance Loop  - 2 x daily - 7 x weekly - 2 sets - 10 reps - Hip  Extension with Resistance Loop  - 2 x daily - 7 x weekly - 2 sets - 10 reps - Tandem Stance with Support  - 2 x daily - 7 x weekly - 1 sets - 3 reps - 30 seconds hold   02/11/22 - Supine Bridge  - 2 x daily - 7 x weekly - 2 sets - 10 reps - Small Range Straight Leg Raise  - 2 x daily - 7 x weekly - 2 sets - 10 reps - Sidelying Hip Abduction  - 2 x daily - 7 x weekly - 2 sets - 10 reps - Standing Heel Raise with Support  - 2 x daily - 7 x weekly - 2 sets - 10 reps  Date: 02/02/2022 Prepared by: [facility] - Rehab  Exercises - Sit to Stand with Armchair  - 2 x daily - 7 x weekly - 1 sets - 10 reps - Seated March  - 2 x daily - 7 x weekly - 1 sets - 10 reps - Seated Heel Toe Raises  - 2 x daily - 7 x weekly - 1 sets - 10 reps - Seated Long Arc Quad  - 2 x daily - 7 x weekly - 1 sets - 10 reps  ASSESSMENT:  CLINICAL IMPRESSION: Session focus with LE strengthening and incorporate balance training as well.  Pt with tendency to side bend trunk when shifting weight, added core strengthening exercises to POC.  Pt tolerated well to session with no rest breaks required this session, noted visible musculature fatigue with activities.  Cueing for mechanics to reduce compensation with squats.  Added sidestep and retro gait for gluteal strengthening and weight shifting for balance.   OBJECTIVE IMPAIRMENTS Abnormal gait, decreased activity tolerance, decreased balance, decreased coordination, decreased endurance, decreased knowledge of use of DME, decreased mobility, difficulty walking, decreased ROM, decreased strength, decreased safety awareness, hypomobility, impaired perceived functional ability, impaired flexibility, and pain.   ACTIVITY LIMITATIONS carrying, lifting, bending, sitting, standing, squatting, sleeping, stairs, transfers, bed mobility, bathing, locomotion level, and caring for others  PARTICIPATION LIMITATIONS: meal prep, cleaning, laundry, driving, shopping, and community activity    REHAB  POTENTIAL: Good  CLINICAL DECISION MAKING: Stable/uncomplicated  EVALUATION COMPLEXITY: Low   GOALS: Goals reviewed with patient? No  SHORT TERM GOALS: Target date: 02/16/2022   patient will be independent with initial HEP Baseline: Goal status: IN PROGRESS  2.  Patient will improve 5 x STS score from 30.94 sec to 25 sec to demonstrate improved functional mobility and increased lower extremity strength.  Baseline:  Goal status: IN PROGRESS   LONG TERM GOALS: Target date: 03/02/2022   Patient will be independent in self management strategies to improve quality of life and functional outcomes.  Baseline:  Goal status: IN PROGRESS  2.  Patient will improve 5 x STS score from 30.94 sec to 20 sec to demonstrate improved functional mobility and increased lower extremity strength.  Baseline:  Goal status: IN PROGRESS  3.  Patient will report at least 50% improvement in overall symptoms and/or function to demonstrate improved functional mobility  Baseline:  Goal status: IN PROGRESS  4.  Patient will increase 2 MWT distance to 300 ft to demonstrate improved functional mobility/ ambulation in community Baseline: 220 ft Goal status: IN PROGRESS  5.  Patient will increase hip flexion strength to 4/5 to be able to improve efficiency of swing through phase with ambulation. Baseline: 3- Goal status: IN PROGRESS   PLAN: PT FREQUENCY: 2x/week  PT DURATION: 4 weeks  PLANNED INTERVENTIONS: Therapeutic exercises, Therapeutic activity, Neuromuscular re-education, Balance training, Gait training, Patient/Family education, Joint manipulation, Joint mobilization, Stair training, Orthotic/Fit training, DME instructions, Aquatic Therapy, Dry Needling, Electrical stimulation, Spinal manipulation, Spinal mobilization, Cryotherapy, Moist heat, Compression bandaging, scar mobilization, Splintting, Taping, Traction, Ultrasound, Ionotophoresis 4mg /ml Dexamethasone, and Manual therapy  PLAN FOR  NEXT SESSION: Hip and lower extremity strengthening; patient with port right anterior chest wall and new fistula right upper extremity  Ihor Austin, LPTA/CLT; CBIS [phone]  5:38 PM, 02/25/22

## 2022-03-02 ENCOUNTER — Encounter (HOSPITAL_COMMUNITY): Payer: Self-pay

## 2022-03-02 ENCOUNTER — Ambulatory Visit (HOSPITAL_COMMUNITY): Payer: Medicare Other

## 2022-03-02 DIAGNOSIS — R600 Localized edema: Secondary | ICD-10-CM | POA: Diagnosis not present

## 2022-03-02 DIAGNOSIS — M6281 Muscle weakness (generalized): Secondary | ICD-10-CM | POA: Diagnosis not present

## 2022-03-02 DIAGNOSIS — R2689 Other abnormalities of gait and mobility: Secondary | ICD-10-CM

## 2022-03-02 NOTE — Therapy (Signed)
OUTPATIENT PHYSICAL THERAPY LOWER EXTREMITY TREATMENT   Patient Name: Lindsey Murillo MRN: [id number] DOB:[birth year], 26 y.o., female Today's Date: 03/03/2022   PT End of Session - 03/02/22 1715     Visit Number 5    Number of Visits 8    Date for PT Re-Evaluation 03/02/22    Authorization Type [employer]; no auth, 35 visit limit PT/ OT/ ST    Authorization - Visit Number 5    Authorization - Number of Visits 35    PT Start Time 7902    PT Stop Time 1733    PT Time Calculation (min) 43 min    Activity Tolerance Patient limited by fatigue;No increased pain;Patient tolerated treatment well    Behavior During Therapy [facility] for tasks assessed/performed              Past Medical History:  Diagnosis Date   Acanthosis nigricans, acquired    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 chronic kidney disease ([place]) 09/25/2021   dialysis M-W-F at Dialysis at [facility] in [place]   Asthma    Diabetic autonomic neuropathy ([place])    Diabetic peripheral neuropathy ([place])    Environmental allergies    Goiter    History of blood transfusion    Hypertension    no meds currently   Hypoglycemia associated with diabetes ([place])    Tachycardia    Thyroiditis, autoimmune    Type 1 diabetes mellitus in patient age 58-19 years with HbA1C goal below 7.5    Past Surgical History:  Procedure Laterality Date   AV FISTULA PLACEMENT Right 12/08/2021   Procedure: ARTERIOVENOUS  FISTULA CREATION VERSUS GRAFT;  Surgeon: [name], MD;  Location: [facility];  Service: Vascular;  Laterality: Right;   [place] Right 01/12/2022   Procedure: RIGHT ARM SECOND STAGE BASILIC VEIN TRANSPOSITION;  Surgeon: [name], MD;  Location: [facility];  Service: Vascular;  Laterality: Right;   BIOPSY  08/27/2016   Procedure: BIOPSY;  Surgeon: Danie Binder, MD;  Location[facility];  Service: Endoscopy;;  duodenum; gastric   BIOPSY  06/27/2019   Procedure: BIOPSY;  Surgeon: Daneil Dolin, MD;  [facility];  Service: Endoscopy;;   BIOPSY  02/09/2022   Procedure: BIOPSY;  Surgeon: Eloise Harman, DO;  [facility];  Service: Endoscopy;;   BREAST CYST EXCISION Right 11/12/2021   Procedure: RIGHT BREAST ABSCESS INCISION AND DRAINAGE;  Surgeon: Rolm Bookbinder, MD;  Location: [place];  Service: General;  Laterality: Right;  LMA   COLONOSCOPY     COLONOSCOPY WITH PROPOFOL N/A 02/09/2022   Procedure: COLONOSCOPY WITH PROPOFOL;  Surgeon: Eloise Harman, DO;  [facility];  Service: Endoscopy;  Laterality: N/A;  7:30am, asa 3, dialysis pt   ESOPHAGOGASTRODUODENOSCOPY N/A 08/27/2016   Dr. Oneida Alar: mild gastritis. Negative celiac. No obvious source for dyspepsia/diarrhea   ESOPHAGOGASTRODUODENOSCOPY (EGD) WITH PROPOFOL N/A 06/27/2019   rourk: Focal abnormality of the gastric mucosa likely due to trauma (heaving).  Biopsy showed mild gastritis, negative for H. pylori.  Esophageal dilation for history of dysphagia but normal-appearing esophagus.   ESOPHAGOGASTRODUODENOSCOPY (EGD) WITH PROPOFOL N/A 02/09/2022   Procedure: ESOPHAGOGASTRODUODENOSCOPY (EGD) WITH PROPOFOL;  Surgeon: Eloise Harman, DO;  [facility];  Service: Endoscopy;  Laterality: N/A;   IR FLUORO GUIDE CV LINE RIGHT  10/13/2021   IR FLUORO GUIDE CV LINE RIGHT  10/19/2021   IR US GUIDE VASC ACCESS RIGHT  10/13/2021  IRRIGATION AND DEBRIDEMENT ABSCESS N/A 10/04/2021   Procedure: IRRIGATION AND DEBRIDEMENT NECK ABSCESS;  Surgeon: Ralene Ok, MD;  Location: [place];  Service: General;  Laterality: N/A;   Patient Active Problem List   Diagnosis Date Noted   Non-adherence to medical treatment 01/21/2022   Esophageal dysphagia 01/19/2022   ESRD (end stage renal disease) ([place]) 12/18/2021   Abscess of right breast 12/18/2021   Physical deconditioning 10/07/2021   Protein-calorie malnutrition, severe ([place]) 10/02/2021   Depression 09/25/2021   Anemia in ESRD (end-stage renal disease) ([place]) 09/25/2021    Normocytic anemia 06/26/2021   Peripheral edema 06/13/2021   Iron deficiency anemia 06/02/2021   Nephrotic syndrome 05/27/2021   Chronic pancreatitis ([place]) 08/22/2019   Pancreatic pseudocyst/cyst 08/22/2019   GERD (gastroesophageal reflux disease) 08/22/2019   Vitamin D deficiency 03/20/2019   Uncontrolled type 1 diabetes mellitus with hyperglycemia ([place]) 05/12/2015   Essential hypertension, benign 11/30/2012   Diabetic peripheral neuropathy ([place])    Asthma     PCP: Coral Spikes, DO [facility]   REFERRING PROVIDER: Coral Spikes, DO [facility]   REFERRING DIAG: R26.9 (ICD-10-CM) - Gait difficulty   THERAPY DIAG:  Other abnormalities of gait and mobility  Muscle weakness (generalized)  Rationale for Evaluation and Treatment Rehabilitation  ONSET DATE: July 2023  SUBJECTIVE:   SUBJECTIVE STATEMENT: Reports receiving dialysis 3 days a week and swelling has resolved BLE.  No reports of pain or recent fall.  Arrived with her mother who sat through session today.    Pt just out of dialysis.   Reports she is feeling good today, no reports of pain.  Arrived with mother and cousins.   PERTINENT HISTORY: Started dialysis July Monday wed and Friday Fistula right side of chest and then will eventually use right arm where new fistula placed  PAIN:  Are you having pain? Yes: NPRS scale: 0/10 Pain location: back Pain description: sore Aggravating factors: unknown Relieving factors: unknown  PRECAUTIONS: Fall  WEIGHT BEARING RESTRICTIONS No  FALLS:  Has patient fallen in last 6 months? Yes. Number of falls 2  LIVING ENVIRONMENT: Lives with: lives with their family Lives in: House/apartment Stairs: Yes: External: 2 steps; none Has following equipment at home: Single point cane, Walker - 2 wheeled, and bed side commode  OCCUPATION: disability  PLOF: Independent with basic ADLs  PATIENT GOALS get my legs stronger   OBJECTIVE:    DIAGNOSTIC FINDINGS: no recent  PATIENT SURVEYS:  LEFS 46/80  COGNITION:  Overall cognitive status: Within functional limits for tasks assessed     SENSATION: WFL  EDEMA:  Legs much smaller than previous visits    LOWER EXTREMITY MMT:  MMT Right eval Left eval Right  03/02/22 Left 03/02/22  Hip flexion 3- 3- 3/5 3-  Hip extension   2+ 2+  Hip abduction   3- 3-  Hip adduction      Hip internal rotation      Hip external rotation      Knee flexion   3- 3-  Knee extension 4 4 4/5 4/5  Ankle dorsiflexion 3+ 4 2+ 2+  Ankle plantarflexion      Ankle inversion      Ankle eversion       (Blank rows = not tested)  FUNCTIONAL TESTS:  5 times sit to stand: 31.94 sec uses hands to assist 2 minute walk test: 220 ft   03/02/22: 03/02/22: 5 STS 24.85" required hands on lap, WBOS and increased height  to complete without HHA. 2MWT: 117ft with SPC  GAIT: Distance walked: 220 ft Assistive device utilized: Single point cane Level of assistance: SBA Comments: increased lateral trunk movement noted; uses SPC left hand    TODAY'S TREATMENT: 03/02/22: 10 STS without HHA required increased height (27in) , cueing to reduce WBOS and relying on back of legs 6in step with knee drive with 1 HR 16X 3" holds 2nd set 10 STS without HHA from 25in, required WBOS to stand from this height 5 STS 24.85"  required hands on lap, WBOS and increased height to 26in complete without HHA.  MMT (see above) 2MWT with SPC 150 ft  02/25/22: 10 STS from 25in height no HHA Heel raise 2x 10 Sidestep RTB 2RT in hallway Retro gait 2RT in hallway with RTB around thigh Toe tapping 6in 10x 2 sets Squats 15x NBOS on foam paloff GTB 20x 2  02/18/22 Nustep Lv 3 5 min for strength and conditioning (seat 8)  Heel raise 2 x 10   Toe raise (unable to clear toes from floor)  Step up 4 inch 2 x10 Sit to stand 2 x 10 Standing hip abduction RTB 2 x 10 Standing hip extension RTB 2 x 10 Semi tandem stance  3  x 30"  LAQ 10 x 5" each   02/11/22 Bridge 2 x 10 SLR 2 x 10 each  Sidelying hip abduction 2 x 10  Seated march x20 Seated heel/ toe raise x20 Seated LAQ 2 x 10   Heel raise 2 x 10 Sit to stand 2 x 10 with UE assist   Physical therapy evaluation and HEP instruction   PATIENT EDUCATION:  Education details: Patient educated on exam findings, POC, scope of PT, HEP. Person educated: Patient Education method: Explanation, Demonstration, and Handouts Education comprehension: verbalized understanding, returned demonstration, verbal cues required, and tactile cues required    HOME EXERCISE PROGRAM: Access Code: [email or web address] URL: [email or web address]  02/18/22 - Hip Abduction with Resistance Loop  - 2 x daily - 7 x weekly - 2 sets - 10 reps - Hip Extension with Resistance Loop  - 2 x daily - 7 x weekly - 2 sets - 10 reps - Tandem Stance with Support  - 2 x daily - 7 x weekly - 1 sets - 3 reps - 30 seconds hold   02/11/22 - Supine Bridge  - 2 x daily - 7 x weekly - 2 sets - 10 reps - Small Range Straight Leg Raise  - 2 x daily - 7 x weekly - 2 sets - 10 reps - Sidelying Hip Abduction  - 2 x daily - 7 x weekly - 2 sets - 10 reps - Standing Heel Raise with Support  - 2 x daily - 7 x weekly - 2 sets - 10 reps  Date: 02/02/2022 Prepared by: [facility]  Exercises - Sit to Stand with Armchair  - 2 x daily - 7 x weekly - 1 sets - 10 reps - Seated March  - 2 x daily - 7 x weekly - 1 sets - 10 reps - Seated Heel Toe Raises  - 2 x daily - 7 x weekly - 1 sets - 10 reps - Seated Long Arc Quad  - 2 x daily - 7 x weekly - 1 sets - 10 reps  ASSESSMENT:  CLINICAL IMPRESSION: Progress note due to date, pt has had 4 session including initial eval with the following findings:  Pt with dialysis earlier today and required increased  height to complete STS with HHA and slower cadence noted with 2MWT.  Pt reports compliance with HEP though unable to recall advanced exercises given to  her since beginning therapy.  MMT presents with weaker musculature except for Rt hip flexors, gluteal testing not complete initial eval, and unsure if initial testing complete in seated vs supine position.  Presents with decreased ranged with dorsiflexion as well.  Pt reports she feels improvements by 20-25% since beginning this round of therapy.  Pt stressed importance of compliance with home exercises for maximal benefits.  Unsure if weakness due to dialysis earlier today and didn't feel good yesterday.   OBJECTIVE IMPAIRMENTS Abnormal gait, decreased activity tolerance, decreased balance, decreased coordination, decreased endurance, decreased knowledge of use of DME, decreased mobility, difficulty walking, decreased ROM, decreased strength, decreased safety awareness, hypomobility, impaired perceived functional ability, impaired flexibility, and pain.   ACTIVITY LIMITATIONS carrying, lifting, bending, sitting, standing, squatting, sleeping, stairs, transfers, bed mobility, bathing, locomotion level, and caring for others  PARTICIPATION LIMITATIONS: meal prep, cleaning, laundry, driving, shopping, and community activity    REHAB POTENTIAL: Good  CLINICAL DECISION MAKING: Stable/uncomplicated  EVALUATION COMPLEXITY: Low   GOALS: Goals reviewed with patient? No  SHORT TERM GOALS: Target date: 02/16/2022   patient will be independent with initial HEP Baseline: 03/02/22: Reports compliance with HEP daily Goal status: IN PROGRESS  2.  Patient will improve 5 x STS score from 30.94 sec to 25 sec to demonstrate improved functional mobility and increased lower extremity strength.  Baseline: 03/02/22: 5 STS 24.85" required hands on lap, WBOS and increased height to 26in complete without HHA. Goal status: IN PROGRESS   LONG TERM GOALS: Target date: 03/02/2022   Patient will be independent in self management strategies to improve quality of life and functional outcomes. Baseline:  Goal  status: IN PROGRESS  2.  Patient will improve 5 x STS score from 30.94 sec to 20 sec to demonstrate improved functional mobility and increased lower extremity strength. Baseline: 03/02/22: 5 STS 24.85" required hands on lap, WBOS and increased height to complete without HHA. Goal status: IN PROGRESS  3.  Patient will report at least 50% improvement in overall symptoms and/or function to demonstrate improved functional mobility Baseline:  03/02/22: 20-25% improvements Goal status: IN PROGRESS  4.  Patient will increase 2 MWT distance to 300 ft to demonstrate improved functional mobility/ ambulation in community Baseline: 220 ft; 03/02/22: 2MWT 170ft with SPC Goal status: IN PROGRESS  5.  Patient will increase hip flexion strength to 4/5 to be able to improve efficiency of swing through phase with ambulation. Baseline: 3- Goal status: IN PROGRESS   PLAN: PT FREQUENCY: 2x/week  PT DURATION: 4 weeks  PLANNED INTERVENTIONS: Therapeutic exercises, Therapeutic activity, Neuromuscular re-education, Balance training, Gait training, Patient/Family education, Joint manipulation, Joint mobilization, Stair training, Orthotic/Fit training, DME instructions, Aquatic Therapy, Dry Needling, Electrical stimulation, Spinal manipulation, Spinal mobilization, Cryotherapy, Moist heat, Compression bandaging, scar mobilization, Splintting, Taping, Traction, Ultrasound, Ionotophoresis 4mg /ml Dexamethasone, and Manual therapy  PLAN FOR NEXT SESSION: Hip and lower extremity strengthening; patient with port right anterior chest wall and new fistula right upper extremity  Ihor Austin, LPTA/CLT; CBIS [phone]  11:45 AM, 03/03/22

## 2022-03-04 ENCOUNTER — Encounter (HOSPITAL_COMMUNITY): Payer: Medicare Other | Admitting: Physical Therapy

## 2022-03-09 ENCOUNTER — Ambulatory Visit (HOSPITAL_COMMUNITY): Payer: Medicare Other

## 2022-03-09 DIAGNOSIS — R2689 Other abnormalities of gait and mobility: Secondary | ICD-10-CM

## 2022-03-09 DIAGNOSIS — M6281 Muscle weakness (generalized): Secondary | ICD-10-CM

## 2022-03-09 DIAGNOSIS — R600 Localized edema: Secondary | ICD-10-CM | POA: Diagnosis not present

## 2022-03-09 NOTE — Therapy (Addendum)
OUTPATIENT PHYSICAL THERAPY LOWER EXTREMITY TREATMENT   Patient Name: Lindsey Murillo MRN: [id number] DOB:10-02-1995, 26 y.o., female Today's Date: 03/09/2022   PT End of Session - 03/09/22 0809     Visit Number 6    Number of Visits 8    Date for PT Re-Evaluation 03/02/22    Authorization Type [employer]; no auth, 35 visit limit PT/ OT/ ST    Authorization - Visit Number 5    Authorization - Number of Visits 35    PT Start Time 0808    PT Stop Time 0850    PT Time Calculation (min) 42 min    Activity Tolerance Patient limited by fatigue;No increased pain;Patient tolerated treatment well    Behavior During Therapy [facility] for tasks assessed/performed               Past Medical History:  Diagnosis Date   Acanthosis nigricans, acquired    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 chronic kidney disease ([place]) 09/25/2021   dialysis M-W-F at Dialysis at [facility] in [place]   Asthma    Diabetic autonomic neuropathy ([place])    Diabetic peripheral neuropathy ([place])    Environmental allergies    Goiter    History of blood transfusion    Hypertension    no meds currently   Hypoglycemia associated with diabetes ([place])    Tachycardia    Thyroiditis, autoimmune    Type 1 diabetes mellitus in patient age 29-19 years with HbA1C goal below 7.5    Past Surgical History:  Procedure Laterality Date   AV FISTULA PLACEMENT Right 12/08/2021   Procedure: ARTERIOVENOUS  FISTULA CREATION VERSUS GRAFT;  Surgeon: [name], MD;  Location: [facility];  Service: Vascular;  Laterality: Right;   [place] Right 01/12/2022   Procedure: RIGHT ARM SECOND STAGE BASILIC VEIN TRANSPOSITION;  Surgeon: [name], MD;  Location: [facility];  Service: Vascular;  Laterality: Right;   BIOPSY  08/27/2016   Procedure: BIOPSY;  Surgeon: Danie Binder, MD;  Location: [facility];  Service: Endoscopy;;  duodenum; gastric   BIOPSY  06/27/2019   Procedure: BIOPSY;  Surgeon: Daneil Dolin, MD;  Location: [facility];  Service: Endoscopy;;   BIOPSY  02/09/2022   Procedure: BIOPSY;  Surgeon: Eloise Harman, DO;  [facility];  Service: Endoscopy;;   BREAST CYST EXCISION Right 11/12/2021   Procedure: RIGHT BREAST ABSCESS INCISION AND DRAINAGE;  Surgeon: Rolm Bookbinder, MD;  Location: [facility];  Service: General;  Laterality: Right;  LMA   COLONOSCOPY     COLONOSCOPY WITH PROPOFOL N/A 02/09/2022   Procedure: COLONOSCOPY WITH PROPOFOL;  Surgeon: Eloise Harman, DO;  [facility];  Service: Endoscopy;  Laterality: N/A;  7:30am, asa 3, dialysis pt   ESOPHAGOGASTRODUODENOSCOPY N/A 08/27/2016   Dr. Oneida Alar: mild gastritis. Negative celiac. No obvious source for dyspepsia/diarrhea   ESOPHAGOGASTRODUODENOSCOPY (EGD) WITH PROPOFOL N/A 06/27/2019   rourk: Focal abnormality of the gastric mucosa likely due to trauma (heaving).  Biopsy showed mild gastritis, negative for H. pylori.  Esophageal dilation for history of dysphagia but normal-appearing esophagus.   ESOPHAGOGASTRODUODENOSCOPY (EGD) WITH PROPOFOL N/A 02/09/2022   Procedure: ESOPHAGOGASTRODUODENOSCOPY (EGD) WITH PROPOFOL;  Surgeon: Eloise Harman, DO;  [facility];  Service: Endoscopy;  Laterality: N/A;   IR FLUORO GUIDE CV LINE RIGHT  10/13/2021   IR FLUORO GUIDE CV LINE RIGHT  10/19/2021   IR US GUIDE VASC ACCESS RIGHT  10/13/2021  IRRIGATION AND DEBRIDEMENT ABSCESS N/A 10/04/2021   Procedure: IRRIGATION AND DEBRIDEMENT NECK ABSCESS;  Surgeon: Ralene Ok, MD;  Location: [place];  Service: General;  Laterality: N/A;   Patient Active Problem List   Diagnosis Date Noted   Non-adherence to medical treatment 01/21/2022   Esophageal dysphagia 01/19/2022   ESRD (end stage renal disease) ([place]) 12/18/2021   Abscess of right breast 12/18/2021   Physical deconditioning 10/07/2021   Protein-calorie malnutrition, severe ([place]) 10/02/2021   Depression 09/25/2021   Anemia in ESRD (end-stage renal disease) ([place]) 09/25/2021    Normocytic anemia 06/26/2021   Peripheral edema 06/13/2021   Iron deficiency anemia 06/02/2021   Nephrotic syndrome 05/27/2021   Chronic pancreatitis ([place]) 08/22/2019   Pancreatic pseudocyst/cyst 08/22/2019   GERD (gastroesophageal reflux disease) 08/22/2019   Vitamin D deficiency 03/20/2019   Uncontrolled type 1 diabetes mellitus with hyperglycemia ([place]) 05/12/2015   Essential hypertension, benign 11/30/2012   Diabetic peripheral neuropathy ([place])    Asthma     PCP: Coral Spikes, DO [place] [facility]   REFERRING PROVIDER: Coral Spikes, DO  FAMILY MEDICINE   REFERRING DIAG: R26.9 (ICD-10-CM) - Gait difficulty   THERAPY DIAG:  Other abnormalities of gait and mobility  Muscle weakness (generalized)  Rationale for Evaluation and Treatment Rehabilitation  ONSET DATE: July 2023  SUBJECTIVE:   SUBJECTIVE STATEMENT: Reports receiving dialysis 3 days a week; have some back pain today; patient having port in chest taken out on Thursday. " I'm so tired this morning; I've had so many appointments on my days I don't do dialysis"  Pt just out of dialysis.   Reports she is feeling good today, no reports of pain.  Arrived with mother and cousins.   PERTINENT HISTORY: Started dialysis July Monday wed and Friday Fistula right side of chest and then will eventually use right arm where new fistula placed  PAIN:  Are you having pain? Yes: NPRS scale: 4/10 Pain location: back Pain description: sore Aggravating factors: unknown Relieving factors: unknown  PRECAUTIONS: Fall  WEIGHT BEARING RESTRICTIONS No  FALLS:  Has patient fallen in last 6 months? Yes. Number of falls 2  LIVING ENVIRONMENT: Lives with: lives with their family Lives in: House/apartment Stairs: Yes: External: 2 steps; none Has following equipment at home: Single point cane, Walker - 2 wheeled, and bed side commode  OCCUPATION: disability  PLOF: Independent with basic ADLs  PATIENT GOALS  get my legs stronger   OBJECTIVE:   DIAGNOSTIC FINDINGS: no recent  PATIENT SURVEYS:  LEFS 46/80  COGNITION:  Overall cognitive status: Within functional limits for tasks assessed     SENSATION: WFL  EDEMA:  Legs much smaller than previous visits    LOWER EXTREMITY MMT:  MMT Right eval Left eval Right  03/02/22 Left 03/02/22  Hip flexion 3- 3- 3/5 3-  Hip extension   2+ 2+  Hip abduction   3- 3-  Hip adduction      Hip internal rotation      Hip external rotation      Knee flexion   3- 3-  Knee extension 4 4 4/5 4/5  Ankle dorsiflexion 3+ 4 2+ 2+  Ankle plantarflexion      Ankle inversion      Ankle eversion       (Blank rows = not tested)  FUNCTIONAL TESTS:  5 times sit to stand: 31.94 sec uses hands to assist 2 minute walk test: 220 ft   03/02/22: 03/02/22: 5 STS 24.85" required  hands on lap, WBOS and increased height to complete without HHA. 2MWT: 140ft with SPC  GAIT: Distance walked: 220 ft Assistive device utilized: Single point cane Level of assistance: SBA Comments: increased lateral trunk movement noted; uses SPC left hand    TODAY'S TREATMENT: 03/09/22 RB precor x 5' for dynamic warm up and strengthening  Sit to stand 2 x 5 using UE's on edge of chair to assist LAQs 2# 2 x 10  Standing: Heel raises x 20 Hip abduction with RTB 2 x 10 each Hip extension with RTB 2 x 10 each 4" toe taps 2 x 10 each Tandem stance 2 x 30"    03/02/22: 10 STS without HHA required increased height (27in) , cueing to reduce WBOS and relying on back of legs 6in step with knee drive with 1 HR 74Q 3" holds 2nd set 10 STS without HHA from 25in, required WBOS to stand from this height 5 STS 24.85"  required hands on lap, WBOS and increased height to 26in complete without HHA.  MMT (see above) 2MWT with SPC 150 ft  02/25/22: 10 STS from 25in height no HHA Heel raise 2x 10 Sidestep RTB 2RT in hallway Retro gait 2RT in hallway with RTB around thigh Toe  tapping 6in 10x 2 sets Squats 15x NBOS on foam paloff GTB 20x 2  02/18/22 Nustep Lv 3 5 min for strength and conditioning (seat 8)  Heel raise 2 x 10   Toe raise (unable to clear toes from floor)  Step up 4 inch 2 x10 Sit to stand 2 x 10 Standing hip abduction RTB 2 x 10 Standing hip extension RTB 2 x 10 Semi tandem stance 3  x 30"  LAQ 10 x 5" each   02/11/22 Bridge 2 x 10 SLR 2 x 10 each  Sidelying hip abduction 2 x 10  Seated march x20 Seated heel/ toe raise x20 Seated LAQ 2 x 10   Heel raise 2 x 10 Sit to stand 2 x 10 with UE assist   Physical therapy evaluation and HEP instruction   PATIENT EDUCATION:  Education details: Patient educated on exam findings, POC, scope of PT, HEP. Person educated: Patient Education method: Explanation, Demonstration, and Handouts Education comprehension: verbalized understanding, returned demonstration, verbal cues required, and tactile cues required    HOME EXERCISE PROGRAM: Access Code: [email or web address] URL: [email or web address]  02/18/22 - Hip Abduction with Resistance Loop  - 2 x daily - 7 x weekly - 2 sets - 10 reps - Hip Extension with Resistance Loop  - 2 x daily - 7 x weekly - 2 sets - 10 reps - Tandem Stance with Support  - 2 x daily - 7 x weekly - 1 sets - 3 reps - 30 seconds hold   02/11/22 - Supine Bridge  - 2 x daily - 7 x weekly - 2 sets - 10 reps - Small Range Straight Leg Raise  - 2 x daily - 7 x weekly - 2 sets - 10 reps - Sidelying Hip Abduction  - 2 x daily - 7 x weekly - 2 sets - 10 reps - Standing Heel Raise with Support  - 2 x daily - 7 x weekly - 2 sets - 10 reps  Date: 02/02/2022 Prepared by: [facility] - [facility]  Exercises - Sit to Stand with Armchair  - 2 x daily - 7 x weekly - 1 sets - 10 reps - Seated March  - 2 x daily - 7 x weekly - 1  sets - 10 reps - Seated Heel Toe Raises  - 2 x daily - 7 x weekly - 1 sets - 10 reps - Seated Long Arc Quad  - 2 x daily - 7 x weekly - 1 sets - 10  reps  ASSESSMENT:  CLINICAL IMPRESSION: Today's session focused on lower extremity strengthening. Added in recumbent bike for strengthening and endurance.  Patient visibly tired/fatigued today; likely due to multiple medical appointments recently. Patient still needs to use her hands minimal amount for sit to stand from regular height chair. Patient will benefit from continued skilled therapy services to address deficits and promote return to optimal function.      OBJECTIVE IMPAIRMENTS Abnormal gait, decreased activity tolerance, decreased balance, decreased coordination, decreased endurance, decreased knowledge of use of DME, decreased mobility, difficulty walking, decreased ROM, decreased strength, decreased safety awareness, hypomobility, impaired perceived functional ability, impaired flexibility, and pain.   ACTIVITY LIMITATIONS carrying, lifting, bending, sitting, standing, squatting, sleeping, stairs, transfers, bed mobility, bathing, locomotion level, and caring for others  PARTICIPATION LIMITATIONS: meal prep, cleaning, laundry, driving, shopping, and community activity    REHAB POTENTIAL: Good  CLINICAL DECISION MAKING: Stable/uncomplicated  EVALUATION COMPLEXITY: Low   GOALS: Goals reviewed with patient? No  SHORT TERM GOALS: Target date: 02/16/2022   patient will be independent with initial HEP Baseline: 03/02/22: Reports compliance with HEP daily Goal status: IN PROGRESS  2.  Patient will improve 5 x STS score from 30.94 sec to 25 sec to demonstrate improved functional mobility and increased lower extremity strength.  Baseline: 03/02/22: 5 STS 24.85" required hands on lap, WBOS and increased height to 26in complete without HHA. Goal status: IN PROGRESS   LONG TERM GOALS: Target date: 03/02/2022   Patient will be independent in self management strategies to improve quality of life and functional outcomes. Baseline:  Goal status: IN PROGRESS  2.  Patient will  improve 5 x STS score from 30.94 sec to 20 sec to demonstrate improved functional mobility and increased lower extremity strength. Baseline: 03/02/22: 5 STS 24.85" required hands on lap, WBOS and increased height to complete without HHA. Goal status: IN PROGRESS  3.  Patient will report at least 50% improvement in overall symptoms and/or function to demonstrate improved functional mobility Baseline:  03/02/22: 20-25% improvements Goal status: IN PROGRESS  4.  Patient will increase 2 MWT distance to 300 ft to demonstrate improved functional mobility/ ambulation in community Baseline: 220 ft; 03/02/22: 2MWT 153ft with SPC Goal status: IN PROGRESS  5.  Patient will increase hip flexion strength to 4/5 to be able to improve efficiency of swing through phase with ambulation. Baseline: 3- Goal status: IN PROGRESS   PLAN: PT FREQUENCY: 2x/week  PT DURATION: 4 weeks  PLANNED INTERVENTIONS: Therapeutic exercises, Therapeutic activity, Neuromuscular re-education, Balance training, Gait training, Patient/Family education, Joint manipulation, Joint mobilization, Stair training, Orthotic/Fit training, DME instructions, Aquatic Therapy, Dry Needling, Electrical stimulation, Spinal manipulation, Spinal mobilization, Cryotherapy, Moist heat, Compression bandaging, scar mobilization, Splintting, Taping, Traction, Ultrasound, Ionotophoresis 4mg /ml Dexamethasone, and Manual therapy  PLAN FOR NEXT SESSION: Hip and lower extremity strengthening; patient with port right anterior chest wall and new fistula right upper extremity  8:53 AM, 03/09/22 Jjesus Dingley Small Tirza Senteno MPT [facility] [place] [phone] [phone]

## 2022-03-09 NOTE — Addendum Note (Signed)
Addended byKarn Cassis S on: 03/09/2022 09:14 AM   Modules accepted: Orders

## 2022-03-10 ENCOUNTER — Ambulatory Visit (HOSPITAL_COMMUNITY): Payer: Medicare Other | Attending: Family Medicine | Admitting: Physical Therapy

## 2022-03-10 DIAGNOSIS — N2581 Secondary hyperparathyroidism of renal origin: Secondary | ICD-10-CM | POA: Diagnosis not present

## 2022-03-10 DIAGNOSIS — Z992 Dependence on renal dialysis: Secondary | ICD-10-CM | POA: Diagnosis not present

## 2022-03-10 DIAGNOSIS — R600 Localized edema: Secondary | ICD-10-CM | POA: Diagnosis not present

## 2022-03-10 DIAGNOSIS — D631 Anemia in chronic kidney disease: Secondary | ICD-10-CM | POA: Diagnosis not present

## 2022-03-10 DIAGNOSIS — D509 Iron deficiency anemia, unspecified: Secondary | ICD-10-CM | POA: Diagnosis not present

## 2022-03-10 DIAGNOSIS — R2689 Other abnormalities of gait and mobility: Secondary | ICD-10-CM

## 2022-03-10 DIAGNOSIS — N186 End stage renal disease: Secondary | ICD-10-CM | POA: Diagnosis not present

## 2022-03-10 DIAGNOSIS — N032 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: Secondary | ICD-10-CM | POA: Diagnosis not present

## 2022-03-10 DIAGNOSIS — E1040 Type 1 diabetes mellitus with diabetic neuropathy, unspecified: Secondary | ICD-10-CM | POA: Diagnosis not present

## 2022-03-10 DIAGNOSIS — M6281 Muscle weakness (generalized): Secondary | ICD-10-CM

## 2022-03-10 NOTE — Therapy (Signed)
OUTPATIENT PHYSICAL THERAPY LOWER EXTREMITY TREATMENT   Patient Name: Lindsey Murillo MRN: [id number] DOB:08-03-1995, 26 y.o., female Today's Date: 03/10/2022   PT End of Session - 03/10/22 1030    Visit Number 7    Number of Visits 15   Date for PT Re-Evaluation 04/05/22    Authorization Type [employer]; no auth, 35 visit limit PT/ OT/ ST    Authorization - Visit Number 7   Progress Note Due on Visit 64    PT Start Time [phone]    PT Stop Time 1030    PT Time Calculation (min) 42 min               Past Medical History:  Diagnosis Date   Acanthosis nigricans, acquired    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 chronic kidney disease ([place]) 09/25/2021   dialysis M-W-F at Dialysis at [facility] in [place]   Asthma    Diabetic autonomic neuropathy ([place])    Diabetic peripheral neuropathy ([place])    Environmental allergies    Goiter    History of blood transfusion    Hypertension    no meds currently   Hypoglycemia associated with diabetes ([place])    Tachycardia    Thyroiditis, autoimmune    Type 1 diabetes mellitus in patient age 28-19 years with HbA1C goal below 7.5    Past Surgical History:  Procedure Laterality Date   AV FISTULA PLACEMENT Right 12/08/2021   Procedure: ARTERIOVENOUS  FISTULA CREATION VERSUS GRAFT;  Surgeon: [name], MD;  Location: [facility];  Service: Vascular;  Laterality: Right;   [place] Right 01/12/2022   Procedure: RIGHT ARM SECOND STAGE BASILIC VEIN TRANSPOSITION;  Surgeon: [name], MD;  Location: [facility];  Service: Vascular;  Laterality: Right;   BIOPSY  08/27/2016   Procedure: BIOPSY;  Surgeon: Danie Binder, MD;  Location[facility];  Service: Endoscopy;;  duodenum; gastric   BIOPSY  06/27/2019   Procedure: BIOPSY;  Surgeon: Daneil Dolin, MD;  [facility];  Service: Endoscopy;;   BIOPSY  02/09/2022   Procedure: BIOPSY;  Surgeon: Eloise Harman, DO;  [facility];  Service: Endoscopy;;   BREAST CYST  EXCISION Right 11/12/2021   Procedure: RIGHT BREAST ABSCESS INCISION AND DRAINAGE;  Surgeon: Rolm Bookbinder, MD;  Location: [facility];  Service: General;  Laterality: Right;  LMA   COLONOSCOPY     COLONOSCOPY WITH PROPOFOL N/A 02/09/2022   Procedure: COLONOSCOPY WITH PROPOFOL;  Surgeon: Eloise Harman, DO;  [facility];  Service: Endoscopy;  Laterality: N/A;  7:30am, asa 3, dialysis pt   ESOPHAGOGASTRODUODENOSCOPY N/A 08/27/2016   Dr. Oneida Alar: mild gastritis. Negative celiac. No obvious source for dyspepsia/diarrhea   ESOPHAGOGASTRODUODENOSCOPY (EGD) WITH PROPOFOL N/A 06/27/2019   rourk: Focal abnormality of the gastric mucosa likely due to trauma (heaving).  Biopsy showed mild gastritis, negative for H. pylori.  Esophageal dilation for history of dysphagia but normal-appearing esophagus.   ESOPHAGOGASTRODUODENOSCOPY (EGD) WITH PROPOFOL N/A 02/09/2022   Procedure: ESOPHAGOGASTRODUODENOSCOPY (EGD) WITH PROPOFOL;  Surgeon: Eloise Harman, DO;  [facility];  Service: Endoscopy;  Laterality: N/A;   IR FLUORO GUIDE CV LINE RIGHT  10/13/2021   IR FLUORO GUIDE CV LINE RIGHT  10/19/2021   IR US GUIDE VASC ACCESS RIGHT  10/13/2021   IRRIGATION AND DEBRIDEMENT ABSCESS N/A 10/04/2021   Procedure: IRRIGATION AND DEBRIDEMENT NECK ABSCESS;  Surgeon: Ralene Ok, MD;  Location: [facility];  Service: General;  Laterality: N/A;   Patient Active Problem List   Diagnosis Date Noted   Non-adherence to medical treatment 01/21/2022   Esophageal dysphagia 01/19/2022   ESRD (end stage renal disease) ([place]) 12/18/2021   Abscess of right breast 12/18/2021   Physical deconditioning 10/07/2021   Protein-calorie malnutrition, severe ([place]) 10/02/2021   Depression 09/25/2021   Anemia in ESRD (end-stage renal disease) ([place]) 09/25/2021   Normocytic anemia 06/26/2021   Peripheral edema 06/13/2021   Iron deficiency anemia 06/02/2021   Nephrotic syndrome 05/27/2021   Chronic pancreatitis ([place]) 08/22/2019    Pancreatic pseudocyst/cyst 08/22/2019   GERD (gastroesophageal reflux disease) 08/22/2019   Vitamin D deficiency 03/20/2019   Uncontrolled type 1 diabetes mellitus with hyperglycemia ([place]) 05/12/2015   Essential hypertension, benign 11/30/2012   Diabetic peripheral neuropathy ([place])    Asthma     PCP: Coral Spikes, DO [facility]   REFERRING PROVIDER: Coral Spikes, DO [facility]   REFERRING DIAG: R26.9 (ICD-10-CM) - Gait difficulty   THERAPY DIAG:  Other abnormalities of gait and mobility  Muscle weakness (generalized)  Localized edema  Rationale for Evaluation and Treatment Rehabilitation  ONSET DATE: July 2023  SUBJECTIVE:   SUBJECTIVE STATEMENT: Pt states that her back is hurting.     PERTINENT HISTORY: Started dialysis July Monday wed and Friday Fistula right side of chest and then will eventually use right arm where new fistula placed  PAIN:  Are you having pain? Yes: NPRS scale: 6/10 Pain location: back Pain description: sore Aggravating factors: unknown Relieving factors: unknown  PRECAUTIONS: Fall  WEIGHT BEARING RESTRICTIONS No  FALLS:  Has patient fallen in last 6 months? Yes. Number of falls 2  LIVING ENVIRONMENT: Lives with: lives with their family Lives in: House/apartment Stairs: Yes: External: 2 steps; none Has following equipment at home: Single point cane, Walker - 2 wheeled, and bed side commode  OCCUPATION: disability  PLOF: Independent with basic ADLs  PATIENT GOALS get my legs stronger   OBJECTIVE:   DIAGNOSTIC FINDINGS: no recent  PATIENT SURVEYS:  LEFS 46/80  COGNITION:  Overall cognitive status: Within functional limits for tasks assessed     SENSATION: WFL  EDEMA:  Legs much smaller than previous visits    LOWER EXTREMITY MMT:  MMT Right eval Left eval Right  03/02/22 Left 03/02/22  Hip flexion 3- 3- 3/5 3-  Hip extension   2+ 2+  Hip abduction   3- 3-  Hip adduction       Hip internal rotation      Hip external rotation      Knee flexion   3- 3-  Knee extension 4 4 4/5 4/5  Ankle dorsiflexion 3+ 4 2+ 2+  Ankle plantarflexion      Ankle inversion      Ankle eversion       (Blank rows = not tested)  FUNCTIONAL TESTS:  5 times sit to stand: 31.94 sec uses hands to assist 2 minute walk test: 220 ft   03/02/22: 03/02/22: 5 STS 24.85" required hands on lap, WBOS and increased height to complete without HHA. 2MWT: 116ft with SPC  GAIT: Distance walked: 220 ft Assistive device utilized: Single point cane Level of assistance: SBA Comments: increased lateral trunk movement noted; uses SPC left hand    TODAY'S TREATMENT: 03/10/22 Supine:  Knee to chest 3 x 30" Hamstring stretch 3 x 30"  LTR x 10  Bridge x 10 Sitting:  sit to stand no UE assist x 10  Standing : Tandem stance with head turns x 10 Marching with opposite arm raise x 10 Functional squat with heel raises x 10  Side stepping x 2 RT  03/09/22 RB precor x 5' for dynamic warm up and strengthening  Sit to stand 2 x 5 using UE's on edge of chair to assist LAQs 2# 2 x 10  Standing: Heel raises x 20 Hip abduction with RTB 2 x 10 each Hip extension with RTB 2 x 10 each 4" toe taps 2 x 10 each Tandem stance 2 x 30"    03/02/22: 10 STS without HHA required increased height (27in) , cueing to reduce WBOS and relying on back of legs 6in step with knee drive with 1 HR 59F 3" holds 2nd set 10 STS without HHA from 25in, required WBOS to stand from this height 5 STS 24.85"  required hands on lap, WBOS and increased height to 26in complete without HHA.  MMT (see above) 2MWT with SPC 150 ft  02/25/22: 10 STS from 25in height no HHA Heel raise 2x 10 Sidestep RTB 2RT in hallway Retro gait 2RT in hallway with RTB around thigh Toe tapping 6in 10x 2 sets Squats 15x NBOS on foam paloff GTB 20x 2   PATIENT EDUCATION:  Education details: Patient educated on exam findings, POC, scope of  PT, HEP. Person educated: Patient Education method: Explanation, Demonstration, and Handouts Education comprehension: verbalized understanding, returned demonstration, verbal cues required, and tactile cues required    HOME EXERCISE PROGRAM: Access Code: [email or web address] URL: [email or web address]  03/10/2022 - Supine Single Knee to Chest Stretch  - 2 x daily - 7 x weekly - 1 sets - 3 reps - 30" hold - Supine Hamstring Stretch  - 2 x daily - 7 x weekly - 1 sets - 3 reps - 30" hold - Supine Lower Trunk Rotation  - 2 x daily - 7 x weekly - 1 sets - 10 reps - 3-5" hold 02/18/22 - Hip Abduction with Resistance Loop  - 2 x daily - 7 x weekly - 2 sets - 10 reps - Hip Extension with Resistance Loop  - 2 x daily - 7 x weekly - 2 sets - 10 reps - Tandem Stance with Support  - 2 x daily - 7 x weekly - 1 sets - 3 reps - 30 seconds hold   02/11/22 - Supine Bridge  - 2 x daily - 7 x weekly - 2 sets - 10 reps - Small Range Straight Leg Raise  - 2 x daily - 7 x weekly - 2 sets - 10 reps - Sidelying Hip Abduction  - 2 x daily - 7 x weekly - 2 sets - 10 reps - Standing Heel Raise with Support  - 2 x daily - 7 x weekly - 2 sets - 10 reps  Date: 02/02/2022 Prepared by: [facility]  Exercises - Sit to Stand with Armchair  - 2 x daily - 7 x weekly - 1 sets - 10 reps - Seated March  - 2 x daily - 7 x weekly - 1 sets - 10 reps - Seated Heel Toe Raises  - 2 x daily - 7 x weekly - 1 sets - 10 reps - Seated Long Arc Quad  - 2 x daily - 7 x weekly - 1 sets - 10 reps  ASSESSMENT:  CLINICAL IMPRESSION: Pt treatment began with Low back stretches which were given for HEP due to pt complaint of low back pain.  Therapy  then progressed with strengthening and balance exercises to improve pt functional ability.   Patient will benefit from continued skilled therapy services to address deficits and promote return to optimal function.      OBJECTIVE IMPAIRMENTS Abnormal gait, decreased activity tolerance,  decreased balance, decreased coordination, decreased endurance, decreased knowledge of use of DME, decreased mobility, difficulty walking, decreased ROM, decreased strength, decreased safety awareness, hypomobility, impaired perceived functional ability, impaired flexibility, and pain.   ACTIVITY LIMITATIONS carrying, lifting, bending, sitting, standing, squatting, sleeping, stairs, transfers, bed mobility, bathing, locomotion level, and caring for others  PARTICIPATION LIMITATIONS: meal prep, cleaning, laundry, driving, shopping, and community activity    REHAB POTENTIAL: Good  CLINICAL DECISION MAKING: Stable/uncomplicated  EVALUATION COMPLEXITY: Low   GOALS: Goals reviewed with patient? No  SHORT TERM GOALS: Target date: 02/16/2022   patient will be independent with initial HEP Baseline: 03/02/22: Reports compliance with HEP daily Goal status: IN PROGRESS  2.  Patient will improve 5 x STS score from 30.94 sec to 25 sec to demonstrate improved functional mobility and increased lower extremity strength.  Baseline: 03/02/22: 5 STS 24.85" required hands on lap, WBOS and increased height to 26in complete without HHA. Goal status: IN PROGRESS   LONG TERM GOALS: Target date: 03/02/2022   Patient will be independent in self management strategies to improve quality of life and functional outcomes. Baseline:  Goal status: IN PROGRESS  2.  Patient will improve 5 x STS score from 30.94 sec to 20 sec to demonstrate improved functional mobility and increased lower extremity strength. Baseline: 03/02/22: 5 STS 24.85" required hands on lap, WBOS and increased height to complete without HHA. Goal status: IN PROGRESS  3.  Patient will report at least 50% improvement in overall symptoms and/or function to demonstrate improved functional mobility Baseline:  03/02/22: 20-25% improvements Goal status: IN PROGRESS  4.  Patient will increase 2 MWT distance to 300 ft to demonstrate improved  functional mobility/ ambulation in community Baseline: 220 ft; 03/02/22: 2MWT 175ft with SPC Goal status: IN PROGRESS  5.  Patient will increase hip flexion strength to 4/5 to be able to improve efficiency of swing through phase with ambulation. Baseline: 3- Goal status: IN PROGRESS   PLAN: PT FREQUENCY: 2x/week  PT DURATION: 4 weeks  PLANNED INTERVENTIONS: Therapeutic exercises, Therapeutic activity, Neuromuscular re-education, Balance training, Gait training, Patient/Family education, Joint manipulation, Joint mobilization, Stair training, Orthotic/Fit training, DME instructions, Aquatic Therapy, Dry Needling, Electrical stimulation, Spinal manipulation, Spinal mobilization, Cryotherapy, Moist heat, Compression bandaging, scar mobilization, Splintting, Taping, Traction, Ultrasound, Ionotophoresis 4mg /ml Dexamethasone, and Manual therapy  PLAN FOR NEXT SESSION: Hip and lower extremity strengthening; patient with port right anterior chest wall and new fistula right upper extremity  Rayetta Humphrey, PT CLT [phone]  11:19

## 2022-03-11 DIAGNOSIS — N186 End stage renal disease: Secondary | ICD-10-CM | POA: Diagnosis not present

## 2022-03-11 DIAGNOSIS — Z452 Encounter for adjustment and management of vascular access device: Secondary | ICD-10-CM | POA: Diagnosis not present

## 2022-03-11 DIAGNOSIS — Z992 Dependence on renal dialysis: Secondary | ICD-10-CM | POA: Diagnosis not present

## 2022-03-16 ENCOUNTER — Encounter (HOSPITAL_COMMUNITY): Payer: Self-pay | Admitting: Physical Therapy

## 2022-03-16 ENCOUNTER — Ambulatory Visit (HOSPITAL_COMMUNITY): Payer: Medicare Other | Admitting: Physical Therapy

## 2022-03-16 DIAGNOSIS — R600 Localized edema: Secondary | ICD-10-CM | POA: Diagnosis not present

## 2022-03-16 DIAGNOSIS — M6281 Muscle weakness (generalized): Secondary | ICD-10-CM | POA: Diagnosis not present

## 2022-03-16 DIAGNOSIS — R2689 Other abnormalities of gait and mobility: Secondary | ICD-10-CM

## 2022-03-16 NOTE — Therapy (Signed)
OUTPATIENT PHYSICAL THERAPY LOWER EXTREMITY TREATMENT   Patient Name: Lindsey Murillo MRN: [id number] DOB:[birth year], 26 y.o., female Today's Date: 03/16/2022     03/16/22 1522  PT Visits / Re-Eval  Visit Number 8  Number of Visits 15  Date for PT Re-Evaluation 04/06/22  Authorization  Authorization Type [employer]; no auth, 35 visit limit PT/ OT/ ST  Authorization - Visit Number 8  Progress Note Due on Visit 17  PT Time Calculation  PT Start Time 1524 (late to appointment)  PT Stop Time 1600  PT Time Calculation (min) 36 min  PT - End of Session  Activity Tolerance Treatment limited secondary to medical complications (Comment)  Behavior During Therapy [facility] for tasks assessed/performed            Past Medical History:  Diagnosis Date   Acanthosis nigricans, acquired    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 chronic kidney disease ([place]) 09/25/2021   dialysis M-W-F at [facility] in [place]   Asthma    Diabetic autonomic neuropathy ([name])    Diabetic peripheral neuropathy ([place])    Environmental allergies    Goiter    History of blood transfusion    Hypertension    no meds currently   Hypoglycemia associated with diabetes ([place])    Tachycardia    Thyroiditis, autoimmune    Type 1 diabetes mellitus in patient age 74-19 years with HbA1C goal below 7.5    Past Surgical History:  Procedure Laterality Date   AV FISTULA PLACEMENT Right 12/08/2021   Procedure: ARTERIOVENOUS  FISTULA CREATION VERSUS GRAFT;  Surgeon: [name], MD;  Location: [facility];  Service: Vascular;  Laterality: Right;   [place] Right 01/12/2022   Procedure: RIGHT ARM SECOND STAGE BASILIC VEIN TRANSPOSITION;  Surgeon: [name], MD;  Location: [facility];  Service: Vascular;  Laterality: Right;   BIOPSY  08/27/2016   Procedure: BIOPSY;  Surgeon: Danie Binder, MD;  [facility];  Service: Endoscopy;;  duodenum; gastric   BIOPSY  06/27/2019   Procedure: BIOPSY;   Surgeon: Daneil Dolin, MD;  [facility];  Service: Endoscopy;;   BIOPSY  02/09/2022   Procedure: BIOPSY;  Surgeon: Eloise Harman, DO;  [facility];  Service: Endoscopy;;   BREAST CYST EXCISION Right 11/12/2021   Procedure: RIGHT BREAST ABSCESS INCISION AND DRAINAGE;  Surgeon: Rolm Bookbinder, MD;  Location: [place];  Service: General;  Laterality: Right;  LMA   COLONOSCOPY     COLONOSCOPY WITH PROPOFOL N/A 02/09/2022   Procedure: COLONOSCOPY WITH PROPOFOL;  Surgeon: Eloise Harman, DO;  [facility];  Service: Endoscopy;  Laterality: N/A;  7:30am, asa 3, dialysis pt   ESOPHAGOGASTRODUODENOSCOPY N/A 08/27/2016   Dr. Oneida Alar: mild gastritis. Negative celiac. No obvious source for dyspepsia/diarrhea   ESOPHAGOGASTRODUODENOSCOPY (EGD) WITH PROPOFOL N/A 06/27/2019   rourk: Focal abnormality of the gastric mucosa likely due to trauma (heaving).  Biopsy showed mild gastritis, negative for H. pylori.  Esophageal dilation for history of dysphagia but normal-appearing esophagus.   ESOPHAGOGASTRODUODENOSCOPY (EGD) WITH PROPOFOL N/A 02/09/2022   Procedure: ESOPHAGOGASTRODUODENOSCOPY (EGD) WITH PROPOFOL;  Surgeon: Eloise Harman, DO;  [facility];  Service: Endoscopy;  Laterality: N/A;   IR FLUORO GUIDE CV LINE RIGHT  10/13/2021   IR FLUORO GUIDE CV LINE RIGHT  10/19/2021   IR US GUIDE VASC ACCESS RIGHT  10/13/2021   IRRIGATION AND DEBRIDEMENT ABSCESS N/A 10/04/2021   Procedure: IRRIGATION  AND DEBRIDEMENT NECK ABSCESS;  Surgeon: Ralene Ok, MD;  Location: [place];  Service: General;  Laterality: N/A;   Patient Active Problem List   Diagnosis Date Noted   Non-adherence to medical treatment 01/21/2022   Esophageal dysphagia 01/19/2022   ESRD (end stage renal disease) ([place]) 12/18/2021   Abscess of right breast 12/18/2021   Physical deconditioning 10/07/2021   Protein-calorie malnutrition, severe ([place]) 10/02/2021   Depression 09/25/2021   Anemia in ESRD  (end-stage renal disease) ([place]) 09/25/2021   Normocytic anemia 06/26/2021   Peripheral edema 06/13/2021   Iron deficiency anemia 06/02/2021   Nephrotic syndrome 05/27/2021   Chronic pancreatitis ([place]) 08/22/2019   Pancreatic pseudocyst/cyst 08/22/2019   GERD (gastroesophageal reflux disease) 08/22/2019   Vitamin D deficiency 03/20/2019   Uncontrolled type 1 diabetes mellitus with hyperglycemia ([place]) 05/12/2015   Essential hypertension, benign 11/30/2012   Diabetic peripheral neuropathy ([place])    Asthma     PCP: Coral Spikes, DO [facility]   REFERRING PROVIDER: Coral Spikes, DO [facility]   REFERRING DIAG: R26.9 (ICD-10-CM) - Gait difficulty   THERAPY DIAG:  Other abnormalities of gait and mobility  Muscle weakness (generalized)  Rationale for Evaluation and Treatment Rehabilitation  ONSET DATE: July 2023  SUBJECTIVE:   SUBJECTIVE STATEMENT: Denies any new issues. States only having trouble with one exercise. Lower pain, denies recent falls.    PERTINENT HISTORY: Started dialysis July Monday wed and Friday Fistula right side of chest and then will eventually use right arm where new fistula placed  PAIN:  Are you having pain? Yes: NPRS scale: 2/10 Pain location: back Pain description: sore Aggravating factors: unknown Relieving factors: unknown  PRECAUTIONS: Fall  WEIGHT BEARING RESTRICTIONS No  FALLS:  Has patient fallen in last 6 months? Yes. Number of falls 2  LIVING ENVIRONMENT: Lives with: lives with their family Lives in: House/apartment Stairs: Yes: External: 2 steps; none Has following equipment at home: Single point cane, Walker - 2 wheeled, and bed side commode  OCCUPATION: disability  PLOF: Independent with basic ADLs  PATIENT GOALS get my legs stronger   OBJECTIVE:   DIAGNOSTIC FINDINGS: no recent  PATIENT SURVEYS:  LEFS 46/80  COGNITION:  Overall cognitive status: Within functional limits for tasks  assessed     SENSATION: WFL  EDEMA:  Legs much smaller than previous visits    LOWER EXTREMITY MMT:  MMT Right eval Left eval Right  03/02/22 Left 03/02/22  Hip flexion 3- 3- 3/5 3-  Hip extension   2+ 2+  Hip abduction   3- 3-  Hip adduction      Hip internal rotation      Hip external rotation      Knee flexion   3- 3-  Knee extension 4 4 4/5 4/5  Ankle dorsiflexion 3+ 4 2+ 2+  Ankle plantarflexion      Ankle inversion      Ankle eversion       (Blank rows = not tested)  FUNCTIONAL TESTS:  5 times sit to stand: 31.94 sec uses hands to assist 2 minute walk test: 220 ft   03/02/22: 03/02/22: 5 STS 24.85" required hands on lap, WBOS and increased height to complete without HHA. 2MWT: 136ft with SPC  GAIT: Distance walked: 220 ft Assistive device utilized: Single point cane Level of assistance: SBA Comments: increased lateral trunk movement noted; uses SPC left hand    TODAY'S TREATMENT: 03/16/22 Knee to chest 3 x 30" Hamstring stretch 3  x 30"  LTR x 10  Bridge x 10 Sitting:  sit to stand no UE assist x 10 bed elevated enough to prevent rocking forward Standing : Marching with opposite arm raise x 10 Functional squat with heel raises 2x 10  Side stepping x 2 RT   03/10/22 Supine:  Knee to chest 3 x 30" Hamstring stretch 3 x 30"  LTR x 10  Bridge 2x 10 SLR x10 ea Sitting:  sit to stand no UE assist x 10 Standing : Tandem stance with head turns x 10 Marching with opposite arm raise x 10 Functional squat with heel raises x 10  Side stepping x 2 RT  03/09/22 RB precor x 5' for dynamic warm up and strengthening  Sit to stand 2 x 5 using UE's on edge of chair to assist LAQs 2# 2 x 10  Standing: Heel raises x 20 Hip abduction with RTB 2 x 10 each Hip extension with RTB 2 x 10 each 4" toe taps 2 x 10 each Tandem stance 2 x 30"    PATIENT EDUCATION:  Education details: Patient educated on exam findings, POC, scope of PT, HEP. Person educated:  Patient Education method: Explanation, Demonstration, and Handouts Education comprehension: verbalized understanding, returned demonstration, verbal cues required, and tactile cues required    HOME EXERCISE PROGRAM: Access Code: [email or web address] URL: [email or web address]  03/10/2022 - Supine Single Knee to Chest Stretch  - 2 x daily - 7 x weekly - 1 sets - 3 reps - 30" hold - Supine Hamstring Stretch  - 2 x daily - 7 x weekly - 1 sets - 3 reps - 30" hold - Supine Lower Trunk Rotation  - 2 x daily - 7 x weekly - 1 sets - 10 reps - 3-5" hold 02/18/22 - Hip Abduction with Resistance Loop  - 2 x daily - 7 x weekly - 2 sets - 10 reps - Hip Extension with Resistance Loop  - 2 x daily - 7 x weekly - 2 sets - 10 reps - Tandem Stance with Support  - 2 x daily - 7 x weekly - 1 sets - 3 reps - 30 seconds hold   02/11/22 - Supine Bridge  - 2 x daily - 7 x weekly - 2 sets - 10 reps - Small Range Straight Leg Raise  - 2 x daily - 7 x weekly - 2 sets - 10 reps - Sidelying Hip Abduction  - 2 x daily - 7 x weekly - 2 sets - 10 reps - Standing Heel Raise with Support  - 2 x daily - 7 x weekly - 2 sets - 10 reps  Date: 02/02/2022 Prepared by: [facility]  Exercises - Sit to Stand with Armchair  - 2 x daily - 7 x weekly - 1 sets - 10 reps - Seated March  - 2 x daily - 7 x weekly - 1 sets - 10 reps - Seated Heel Toe Raises  - 2 x daily - 7 x weekly - 1 sets - 10 reps - Seated Long Arc Quad  - 2 x daily - 7 x weekly - 1 sets - 10 reps  ASSESSMENT:  CLINICAL IMPRESSION: Required intermittent assist for balance with standing exercises. VC and demonstration for technique with squats and sit<>stand. Good effort from patient, tolerating increased volume today with several exercises. Denies increase in pain throughout. Patient will continue to benefit from skilled physical therapy services to further improve independence with functional mobility.  OBJECTIVE IMPAIRMENTS Abnormal gait, decreased  activity tolerance, decreased balance, decreased coordination, decreased endurance, decreased knowledge of use of DME, decreased mobility, difficulty walking, decreased ROM, decreased strength, decreased safety awareness, hypomobility, impaired perceived functional ability, impaired flexibility, and pain.   ACTIVITY LIMITATIONS carrying, lifting, bending, sitting, standing, squatting, sleeping, stairs, transfers, bed mobility, bathing, locomotion level, and caring for others  PARTICIPATION LIMITATIONS: meal prep, cleaning, laundry, driving, shopping, and community activity    REHAB POTENTIAL: Good  CLINICAL DECISION MAKING: Stable/uncomplicated  EVALUATION COMPLEXITY: Low   GOALS: Goals reviewed with patient? No  SHORT TERM GOALS: Target date: 02/16/2022   patient will be independent with initial HEP Baseline: 03/02/22: Reports compliance with HEP daily Goal status: IN PROGRESS  2.  Patient will improve 5 x STS score from 30.94 sec to 25 sec to demonstrate improved functional mobility and increased lower extremity strength.  Baseline: 03/02/22: 5 STS 24.85" required hands on lap, WBOS and increased height to 26in complete without HHA. Goal status: IN PROGRESS   LONG TERM GOALS: Target date: 03/02/2022   Patient will be independent in self management strategies to improve quality of life and functional outcomes. Baseline:  Goal status: IN PROGRESS  2.  Patient will improve 5 x STS score from 30.94 sec to 20 sec to demonstrate improved functional mobility and increased lower extremity strength. Baseline: 03/02/22: 5 STS 24.85" required hands on lap, WBOS and increased height to complete without HHA. Goal status: IN PROGRESS  3.  Patient will report at least 50% improvement in overall symptoms and/or function to demonstrate improved functional mobility Baseline:  03/02/22: 20-25% improvements Goal status: IN PROGRESS  4.  Patient will increase 2 MWT distance to 300 ft to  demonstrate improved functional mobility/ ambulation in community Baseline: 220 ft; 03/02/22: 2MWT 157ft with SPC Goal status: IN PROGRESS  5.  Patient will increase hip flexion strength to 4/5 to be able to improve efficiency of swing through phase with ambulation. Baseline: 3- Goal status: IN PROGRESS   PLAN: PT FREQUENCY: 2x/week  PT DURATION: 4 weeks  PLANNED INTERVENTIONS: Therapeutic exercises, Therapeutic activity, Neuromuscular re-education, Balance training, Gait training, Patient/Family education, Joint manipulation, Joint mobilization, Stair training, Orthotic/Fit training, DME instructions, Aquatic Therapy, Dry Needling, Electrical stimulation, Spinal manipulation, Spinal mobilization, Cryotherapy, Moist heat, Compression bandaging, scar mobilization, Splintting, Taping, Traction, Ultrasound, Ionotophoresis 4mg /ml Dexamethasone, and Manual therapy  PLAN FOR NEXT SESSION: Hip and lower extremity strengthening; patient with port right anterior chest wall and new fistula right upper extremity  Candie Mile, PT, DPT Physical Therapist [facility] [place]

## 2022-03-17 ENCOUNTER — Encounter (HOSPITAL_COMMUNITY): Payer: Medicare Other | Admitting: Physical Therapy

## 2022-03-18 ENCOUNTER — Encounter (HOSPITAL_COMMUNITY): Payer: Medicare Other | Admitting: Physical Therapy

## 2022-03-18 DIAGNOSIS — H2513 Age-related nuclear cataract, bilateral: Secondary | ICD-10-CM | POA: Diagnosis not present

## 2022-03-18 DIAGNOSIS — H2512 Age-related nuclear cataract, left eye: Secondary | ICD-10-CM | POA: Diagnosis not present

## 2022-03-18 DIAGNOSIS — H3341 Traction detachment of retina, right eye: Secondary | ICD-10-CM | POA: Diagnosis not present

## 2022-03-18 LAB — HM DIABETES EYE EXAM

## 2022-03-19 DIAGNOSIS — N186 End stage renal disease: Secondary | ICD-10-CM | POA: Diagnosis not present

## 2022-03-19 DIAGNOSIS — D631 Anemia in chronic kidney disease: Secondary | ICD-10-CM | POA: Diagnosis not present

## 2022-03-19 DIAGNOSIS — D509 Iron deficiency anemia, unspecified: Secondary | ICD-10-CM | POA: Diagnosis not present

## 2022-03-19 DIAGNOSIS — E1040 Type 1 diabetes mellitus with diabetic neuropathy, unspecified: Secondary | ICD-10-CM | POA: Diagnosis not present

## 2022-03-19 DIAGNOSIS — N2581 Secondary hyperparathyroidism of renal origin: Secondary | ICD-10-CM | POA: Diagnosis not present

## 2022-03-19 DIAGNOSIS — Z992 Dependence on renal dialysis: Secondary | ICD-10-CM | POA: Diagnosis not present

## 2022-03-23 ENCOUNTER — Encounter (HOSPITAL_COMMUNITY): Payer: Self-pay | Admitting: Physical Therapy

## 2022-03-23 ENCOUNTER — Telehealth: Payer: Self-pay | Admitting: Family Medicine

## 2022-03-23 ENCOUNTER — Ambulatory Visit (HOSPITAL_COMMUNITY): Payer: Medicare Other | Admitting: Physical Therapy

## 2022-03-23 DIAGNOSIS — R600 Localized edema: Secondary | ICD-10-CM | POA: Diagnosis not present

## 2022-03-23 DIAGNOSIS — M6281 Muscle weakness (generalized): Secondary | ICD-10-CM | POA: Diagnosis not present

## 2022-03-23 DIAGNOSIS — R2689 Other abnormalities of gait and mobility: Secondary | ICD-10-CM | POA: Diagnosis not present

## 2022-03-23 NOTE — Therapy (Signed)
OUTPATIENT PHYSICAL THERAPY LOWER EXTREMITY TREATMENT   Patient Name: ALIVIA CIMINO MRN: [id number] DOB:12-19-1995, 26 y.o., female Today's Date: 03/23/2022    03/23/22 1259  PT Visits / Re-Eval  Visit Number 9  Number of Visits 15  Date for PT Re-Evaluation 04/06/22  Authorization  Authorization Type [employer]; no auth, 35 visit limit PT/ OT/ ST  Authorization - Visit Number 8  Progress Note Due on Visit 17  PT Time Calculation  PT Start Time 1300  PT Stop Time 1344  PT Time Calculation (min) 44 min  PT - End of Session  Activity Tolerance Treatment limited secondary to medical complications (Comment)  Behavior During Therapy [facility] for tasks assessed/performed          Past Medical History:  Diagnosis Date   Acanthosis nigricans, acquired    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 chronic kidney disease ([place]) 09/25/2021   dialysis M-W-F at Dialysis at [facility] in [place]   Asthma    Diabetic autonomic neuropathy ([name])    Diabetic peripheral neuropathy ([name])    Environmental allergies    Goiter    History of blood transfusion    Hypertension    no meds currently   Hypoglycemia associated with diabetes ([place])    Tachycardia    Thyroiditis, autoimmune    Type 1 diabetes mellitus in patient age 10-19 years with HbA1C goal below 7.5    Past Surgical History:  Procedure Laterality Date   AV FISTULA PLACEMENT Right 12/08/2021   Procedure: ARTERIOVENOUS  FISTULA CREATION VERSUS GRAFT;  Surgeon: [name], MD;  Location: [facility];  Service: Vascular;  Laterality: Right;   [place] Right 01/12/2022   Procedure: RIGHT ARM SECOND STAGE BASILIC VEIN TRANSPOSITION;  Surgeon: [name], MD;  Location: [facility];  Service: Vascular;  Laterality: Right;   BIOPSY  08/27/2016   Procedure: BIOPSY;  Surgeon: Danie Binder, MD;  Location[facility];  Service: Endoscopy;;  duodenum; gastric   BIOPSY  06/27/2019   Procedure: BIOPSY;  Surgeon: Daneil Dolin, MD;   [facility];  Service: Endoscopy;;   BIOPSY  02/09/2022   Procedure: BIOPSY;  Surgeon: Eloise Harman, DO;  [facility];  Service: Endoscopy;;   BREAST CYST EXCISION Right 11/12/2021   Procedure: RIGHT BREAST ABSCESS INCISION AND DRAINAGE;  Surgeon: Rolm Bookbinder, MD;  Location: [place];  Service: General;  Laterality: Right;  LMA   COLONOSCOPY     COLONOSCOPY WITH PROPOFOL N/A 02/09/2022   Procedure: COLONOSCOPY WITH PROPOFOL;  Surgeon: Eloise Harman, DO;  [facility];  Service: Endoscopy;  Laterality: N/A;  7:30am, asa 3, dialysis pt   ESOPHAGOGASTRODUODENOSCOPY N/A 08/27/2016   Dr. Oneida Alar: mild gastritis. Negative celiac. No obvious source for dyspepsia/diarrhea   ESOPHAGOGASTRODUODENOSCOPY (EGD) WITH PROPOFOL N/A 06/27/2019   rourk: Focal abnormality of the gastric mucosa likely due to trauma (heaving).  Biopsy showed mild gastritis, negative for H. pylori.  Esophageal dilation for history of dysphagia but normal-appearing esophagus.   ESOPHAGOGASTRODUODENOSCOPY (EGD) WITH PROPOFOL N/A 02/09/2022   Procedure: ESOPHAGOGASTRODUODENOSCOPY (EGD) WITH PROPOFOL;  Surgeon: Eloise Harman, DO;  [facility];  Service: Endoscopy;  Laterality: N/A;   IR FLUORO GUIDE CV LINE RIGHT  10/13/2021   IR FLUORO GUIDE CV LINE RIGHT  10/19/2021   IR US GUIDE VASC ACCESS RIGHT  10/13/2021   IRRIGATION AND DEBRIDEMENT ABSCESS N/A 10/04/2021   Procedure: IRRIGATION AND DEBRIDEMENT NECK ABSCESS;  Surgeon:  Ralene Ok, MD;  Location: [place];  Service: General;  Laterality: N/A;   Patient Active Problem List   Diagnosis Date Noted   Non-adherence to medical treatment 01/21/2022   Esophageal dysphagia 01/19/2022   ESRD (end stage renal disease) ([place]) 12/18/2021   Abscess of right breast 12/18/2021   Physical deconditioning 10/07/2021   Protein-calorie malnutrition, severe ([place]) 10/02/2021   Depression 09/25/2021   Anemia in ESRD (end-stage renal disease) ([place])  09/25/2021   Normocytic anemia 06/26/2021   Peripheral edema 06/13/2021   Iron deficiency anemia 06/02/2021   Nephrotic syndrome 05/27/2021   Chronic pancreatitis ([place]) 08/22/2019   Pancreatic pseudocyst/cyst 08/22/2019   GERD (gastroesophageal reflux disease) 08/22/2019   Vitamin D deficiency 03/20/2019   Uncontrolled type 1 diabetes mellitus with hyperglycemia ([place]) 05/12/2015   Essential hypertension, benign 11/30/2012   Diabetic peripheral neuropathy ([place])    Asthma     PCP: Coral Spikes, DO [facility]   REFERRING PROVIDER: Coral Spikes, DO [facility]   REFERRING DIAG: R26.9 (ICD-10-CM) - Gait difficulty   THERAPY DIAG:  Muscle weakness (generalized)  Rationale for Evaluation and Treatment Rehabilitation  ONSET DATE: July 2023  SUBJECTIVE:   SUBJECTIVE STATEMENT: Having cataract surgery in the near future (date unknown,) and will keep [place] updated. Mild back pain today.      PERTINENT HISTORY: Started dialysis July Monday wed and Friday Fistula right side of chest and then will eventually use right arm where new fistula placed  PAIN:  Are you having pain? Yes: NPRS scale: 3/10 Pain location: back Pain description: sore Aggravating factors: unknown Relieving factors: unknown  PRECAUTIONS: Fall  WEIGHT BEARING RESTRICTIONS No  FALLS:  Has patient fallen in last 6 months? Yes. Number of falls 2  LIVING ENVIRONMENT: Lives with: lives with their family Lives in: House/apartment Stairs: Yes: External: 2 steps; none Has following equipment at home: Single point cane, Walker - 2 wheeled, and bed side commode  OCCUPATION: disability  PLOF: Independent with basic ADLs  PATIENT GOALS get my legs stronger   OBJECTIVE:   DIAGNOSTIC FINDINGS: no recent  PATIENT SURVEYS:  LEFS 46/80  COGNITION:  Overall cognitive status: Within functional limits for tasks assessed     SENSATION: WFL  EDEMA:  Legs much smaller than  previous visits    LOWER EXTREMITY MMT:  MMT Right eval Left eval Right  03/02/22 Left 03/02/22  Hip flexion 3- 3- 3/5 3-  Hip extension   2+ 2+  Hip abduction   3- 3-  Hip adduction      Hip internal rotation      Hip external rotation      Knee flexion   3- 3-  Knee extension 4 4 4/5 4/5  Ankle dorsiflexion 3+ 4 2+ 2+  Ankle plantarflexion      Ankle inversion      Ankle eversion       (Blank rows = not tested)  FUNCTIONAL TESTS:  5 times sit to stand: 31.94 sec uses hands to assist 2 minute walk test: 220 ft   03/02/22: 03/02/22: 5 STS 24.85" required hands on lap, WBOS and increased height to complete without HHA. 2MWT: 152ft with SPC  GAIT: Distance walked: 220 ft Assistive device utilized: Single point cane Level of assistance: SBA Comments: increased lateral trunk movement noted; uses SPC left hand    TODAY'S TREATMENT:  03/23/22 Bridge 3 x 10 Sidelying clamshell 3x10 Prone superman, hand at hips 2x10 SLR 3x10  Sitting:  sit to stand no UE assist 2 x 10 bed elevated  Standing : Mountain climbers leaning on wall x30 Forward marching with opposite arm raise x 3 RT Side stepping with RTB around knees x 4 RT    03/16/22 Knee to chest 3 x 30" Hamstring stretch 3 x 30"  LTR x 10  Bridge x 10 Sitting:  sit to stand no UE assist x 10 bed elevated enough to prevent rocking forward Standing : Marching with opposite arm raise x 10 Functional squat with heel raises 2x 10  Side stepping x 2 RT   03/10/22 Supine:  Knee to chest 3 x 30" Hamstring stretch 3 x 30"  LTR x 10  Bridge 2x 10 SLR x10 ea Sitting:  sit to stand no UE assist x 10 Standing : Tandem stance with head turns x 10 Marching with opposite arm raise x 10 Functional squat with heel raises x 10  Side stepping x 2 RT     PATIENT EDUCATION:  Education details: Patient educated on exam findings, POC, scope of PT, HEP. Person educated: Patient Education method: Explanation,  Demonstration, and Handouts Education comprehension: verbalized understanding, returned demonstration, verbal cues required, and tactile cues required    HOME EXERCISE PROGRAM: Access Code: [email or web address] URL: [email or web address] Date: 03/23/2022 Prepared by: Candie Mile  Exercises - Clamshell  - 1 x daily - 7 x weekly - 3 sets - 10 reps - [place] on Counter  - 1 x daily - 7 x weekly - 3 sets - 10 reps - Marching with Same-Side Arm Raise  - 1 x daily - 7 x weekly - 3 sets - 10 reps    Access Code: [email or web address] URL: [email or web address]  03/10/2022 - Supine Single Knee to Chest Stretch  - 2 x daily - 7 x weekly - 1 sets - 3 reps - 30" hold - Supine Hamstring Stretch  - 2 x daily - 7 x weekly - 1 sets - 3 reps - 30" hold - Supine Lower Trunk Rotation  - 2 x daily - 7 x weekly - 1 sets - 10 reps - 3-5" hold 02/18/22 - Hip Abduction with Resistance Loop  - 2 x daily - 7 x weekly - 2 sets - 10 reps - Hip Extension with Resistance Loop  - 2 x daily - 7 x weekly - 2 sets - 10 reps - Tandem Stance with Support  - 2 x daily - 7 x weekly - 1 sets - 3 reps - 30 seconds hold   02/11/22 - Supine Bridge  - 2 x daily - 7 x weekly - 2 sets - 10 reps - Small Range Straight Leg Raise  - 2 x daily - 7 x weekly - 2 sets - 10 reps - Sidelying Hip Abduction  - 2 x daily - 7 x weekly - 2 sets - 10 reps - Standing Heel Raise with Support  - 2 x daily - 7 x weekly - 2 sets - 10 reps  Date: 02/02/2022 Prepared by: [facility]  Exercises - Sit to Stand with Armchair  - 2 x daily - 7 x weekly - 1 sets - 10 reps - Seated March  - 2 x daily - 7 x weekly - 1 sets - 10 reps - Seated Heel Toe Raises  - 2 x daily - 7 x weekly - 1 sets - 10 reps - Seated Long Arc Quad  - 2 x daily - 7 x weekly -  1 sets - 10 reps  ASSESSMENT:  CLINICAL IMPRESSION: Emphasized treatment for LE and core strength today. Followed by static and dynamic balance activities. HEP updated. Reports  reduced back pain when exercising. Bed height adjusted for challenge but with good form. Min assist with march. Patient will continue to benefit from skilled physical therapy services to further improve independence with functional mobility.     OBJECTIVE IMPAIRMENTS Abnormal gait, decreased activity tolerance, decreased balance, decreased coordination, decreased endurance, decreased knowledge of use of DME, decreased mobility, difficulty walking, decreased ROM, decreased strength, decreased safety awareness, hypomobility, impaired perceived functional ability, impaired flexibility, and pain.   ACTIVITY LIMITATIONS carrying, lifting, bending, sitting, standing, squatting, sleeping, stairs, transfers, bed mobility, bathing, locomotion level, and caring for others  PARTICIPATION LIMITATIONS: meal prep, cleaning, laundry, driving, shopping, and community activity    REHAB POTENTIAL: Good  CLINICAL DECISION MAKING: Stable/uncomplicated  EVALUATION COMPLEXITY: Low   GOALS: Goals reviewed with patient? No  SHORT TERM GOALS: Target date: 02/16/2022   patient will be independent with initial HEP Baseline: 03/02/22: Reports compliance with HEP daily Goal status: IN PROGRESS  2.  Patient will improve 5 x STS score from 30.94 sec to 25 sec to demonstrate improved functional mobility and increased lower extremity strength.  Baseline: 03/02/22: 5 STS 24.85" required hands on lap, WBOS and increased height to 26in complete without HHA. Goal status: IN PROGRESS   LONG TERM GOALS: Target date: 03/02/2022   Patient will be independent in self management strategies to improve quality of life and functional outcomes. Baseline:  Goal status: IN PROGRESS  2.  Patient will improve 5 x STS score from 30.94 sec to 20 sec to demonstrate improved functional mobility and increased lower extremity strength. Baseline: 03/02/22: 5 STS 24.85" required hands on lap, WBOS and increased height to complete  without HHA. Goal status: IN PROGRESS  3.  Patient will report at least 50% improvement in overall symptoms and/or function to demonstrate improved functional mobility Baseline:  03/02/22: 20-25% improvements Goal status: IN PROGRESS  4.  Patient will increase 2 MWT distance to 300 ft to demonstrate improved functional mobility/ ambulation in community Baseline: 220 ft; 03/02/22: 2MWT 137ft with SPC Goal status: IN PROGRESS  5.  Patient will increase hip flexion strength to 4/5 to be able to improve efficiency of swing through phase with ambulation. Baseline: 3- Goal status: IN PROGRESS   PLAN: PT FREQUENCY: 2x/week  PT DURATION: 4 weeks  PLANNED INTERVENTIONS: Therapeutic exercises, Therapeutic activity, Neuromuscular re-education, Balance training, Gait training, Patient/Family education, Joint manipulation, Joint mobilization, Stair training, Orthotic/Fit training, DME instructions, Aquatic Therapy, Dry Needling, Electrical stimulation, Spinal manipulation, Spinal mobilization, Cryotherapy, Moist heat, Compression bandaging, scar mobilization, Splintting, Taping, Traction, Ultrasound, Ionotophoresis 4mg /ml Dexamethasone, and Manual therapy  PLAN FOR NEXT SESSION: Hip and lower extremity strengthening; patient with port right anterior chest wall and new fistula right upper extremity  Candie Mile, PT, DPT Physical Therapist [facility] [place]

## 2022-03-23 NOTE — Telephone Encounter (Signed)
Mom dropped off form to be completed for patient to receive services from [facility]. She is needing soon as possible.. In your box.

## 2022-03-24 DIAGNOSIS — Z992 Dependence on renal dialysis: Secondary | ICD-10-CM | POA: Diagnosis not present

## 2022-03-24 DIAGNOSIS — E1040 Type 1 diabetes mellitus with diabetic neuropathy, unspecified: Secondary | ICD-10-CM | POA: Diagnosis not present

## 2022-03-24 DIAGNOSIS — N186 End stage renal disease: Secondary | ICD-10-CM | POA: Diagnosis not present

## 2022-03-24 DIAGNOSIS — D509 Iron deficiency anemia, unspecified: Secondary | ICD-10-CM | POA: Diagnosis not present

## 2022-03-24 DIAGNOSIS — D631 Anemia in chronic kidney disease: Secondary | ICD-10-CM | POA: Diagnosis not present

## 2022-03-24 DIAGNOSIS — N2581 Secondary hyperparathyroidism of renal origin: Secondary | ICD-10-CM | POA: Diagnosis not present

## 2022-03-25 ENCOUNTER — Encounter (HOSPITAL_COMMUNITY): Payer: Medicare Other

## 2022-03-25 NOTE — Telephone Encounter (Signed)
Form completed and pt mom is aware. Form up front for pick up

## 2022-03-26 ENCOUNTER — Encounter (HOSPITAL_COMMUNITY): Payer: Self-pay | Admitting: Physical Therapy

## 2022-03-26 ENCOUNTER — Ambulatory Visit (HOSPITAL_COMMUNITY): Payer: Medicare Other | Admitting: Physical Therapy

## 2022-03-26 DIAGNOSIS — E1040 Type 1 diabetes mellitus with diabetic neuropathy, unspecified: Secondary | ICD-10-CM | POA: Diagnosis not present

## 2022-03-26 DIAGNOSIS — M6281 Muscle weakness (generalized): Secondary | ICD-10-CM | POA: Diagnosis not present

## 2022-03-26 DIAGNOSIS — D509 Iron deficiency anemia, unspecified: Secondary | ICD-10-CM | POA: Diagnosis not present

## 2022-03-26 DIAGNOSIS — Z992 Dependence on renal dialysis: Secondary | ICD-10-CM | POA: Diagnosis not present

## 2022-03-26 DIAGNOSIS — R600 Localized edema: Secondary | ICD-10-CM

## 2022-03-26 DIAGNOSIS — R2689 Other abnormalities of gait and mobility: Secondary | ICD-10-CM | POA: Diagnosis not present

## 2022-03-26 DIAGNOSIS — N2581 Secondary hyperparathyroidism of renal origin: Secondary | ICD-10-CM | POA: Diagnosis not present

## 2022-03-26 DIAGNOSIS — D631 Anemia in chronic kidney disease: Secondary | ICD-10-CM | POA: Diagnosis not present

## 2022-03-26 DIAGNOSIS — N186 End stage renal disease: Secondary | ICD-10-CM | POA: Diagnosis not present

## 2022-03-26 NOTE — Therapy (Signed)
OUTPATIENT PHYSICAL THERAPY LOWER EXTREMITY TREATMENT   Patient Name: Lindsey Murillo MRN: [id number] DOB:[birth year], 26 y.o., female Today's Date: 03/26/2022     03/26/22 1654  PT Visits / Re-Eval  Visit Number 10  Number of Visits 15  Date for PT Re-Evaluation 04/06/22  Authorization  Authorization Type [employer]; no auth, 35 visit limit PT/ OT/ ST  Progress Note Due on Visit 17  PT Time Calculation  PT Start Time 1649  PT Stop Time 1729  PT Time Calculation (min) 40 min  PT - End of Session  Activity Tolerance Treatment limited secondary to medical complications (Comment)  Behavior During Therapy [facility] for tasks assessed/performed     Past Medical History:  Diagnosis Date   Acanthosis nigricans, acquired    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 chronic kidney disease ([place]) 09/25/2021   dialysis M-W-F at [facility] care in [place]   Asthma    Diabetic autonomic neuropathy ([name])    Diabetic peripheral neuropathy ([place])    Environmental allergies    Goiter    History of blood transfusion    Hypertension    no meds currently   Hypoglycemia associated with diabetes ([place])    Tachycardia    Thyroiditis, autoimmune    Type 1 diabetes mellitus in patient age 47-19 years with HbA1C goal below 7.5    Past Surgical History:  Procedure Laterality Date   AV FISTULA PLACEMENT Right 12/08/2021   Procedure: ARTERIOVENOUS  FISTULA CREATION VERSUS GRAFT;  Surgeon: [name], MD;  Location: [facility];  Service: Vascular;  Laterality: Right;   [place] Right 01/12/2022   Procedure: RIGHT ARM SECOND STAGE BASILIC VEIN TRANSPOSITION;  Surgeon: [name], MD;  Location: [facility];  Service: Vascular;  Laterality: Right;   BIOPSY  08/27/2016   Procedure: BIOPSY;  Surgeon: Danie Binder, MD;  [facility];  Service: Endoscopy;;  duodenum; gastric   BIOPSY  06/27/2019   Procedure: BIOPSY;  Surgeon: Daneil Dolin, MD;  [facility];  Service:  Endoscopy;;   BIOPSY  02/09/2022   Procedure: BIOPSY;  Surgeon: Eloise Harman, DO;  [facility];  Service: Endoscopy;;   BREAST CYST EXCISION Right 11/12/2021   Procedure: RIGHT BREAST ABSCESS INCISION AND DRAINAGE;  Surgeon: Rolm Bookbinder, MD;  Location: [place];  Service: General;  Laterality: Right;  LMA   COLONOSCOPY     COLONOSCOPY WITH PROPOFOL N/A 02/09/2022   Procedure: COLONOSCOPY WITH PROPOFOL;  Surgeon: Eloise Harman, DO;  [facility];  Service: Endoscopy;  Laterality: N/A;  7:30am, asa 3, dialysis pt   ESOPHAGOGASTRODUODENOSCOPY N/A 08/27/2016   Dr. Oneida Alar: mild gastritis. Negative celiac. No obvious source for dyspepsia/diarrhea   ESOPHAGOGASTRODUODENOSCOPY (EGD) WITH PROPOFOL N/A 06/27/2019   rourk: Focal abnormality of the gastric mucosa likely due to trauma (heaving).  Biopsy showed mild gastritis, negative for H. pylori.  Esophageal dilation for history of dysphagia but normal-appearing esophagus.   ESOPHAGOGASTRODUODENOSCOPY (EGD) WITH PROPOFOL N/A 02/09/2022   Procedure: ESOPHAGOGASTRODUODENOSCOPY (EGD) WITH PROPOFOL;  Surgeon: Eloise Harman, DO;  [facility];  Service: Endoscopy;  Laterality: N/A;   IR FLUORO GUIDE CV LINE RIGHT  10/13/2021   IR FLUORO GUIDE CV LINE RIGHT  10/19/2021   IR US GUIDE VASC ACCESS RIGHT  10/13/2021   IRRIGATION AND DEBRIDEMENT ABSCESS N/A 10/04/2021   Procedure: IRRIGATION AND DEBRIDEMENT NECK ABSCESS;  Surgeon: Ralene Ok, MD;  Location: [place];  Service: General;  Laterality: N/A;   Patient Active Problem List   Diagnosis Date Noted   Non-adherence to medical treatment 01/21/2022   Esophageal dysphagia 01/19/2022   ESRD (end stage renal disease) ([place]) 12/18/2021   Abscess of right breast 12/18/2021   Physical deconditioning 10/07/2021   Protein-calorie malnutrition, severe ([place]) 10/02/2021   Depression 09/25/2021   Anemia in ESRD (end-stage renal disease) ([place]) 09/25/2021   Normocytic anemia  06/26/2021   Peripheral edema 06/13/2021   Iron deficiency anemia 06/02/2021   Nephrotic syndrome 05/27/2021   Chronic pancreatitis ([place]) 08/22/2019   Pancreatic pseudocyst/cyst 08/22/2019   GERD (gastroesophageal reflux disease) 08/22/2019   Vitamin D deficiency 03/20/2019   Uncontrolled type 1 diabetes mellitus with hyperglycemia ([place]) 05/12/2015   Essential hypertension, benign 11/30/2012   Diabetic peripheral neuropathy ([place])    Asthma     PCP: Coral Spikes, DO [facility]   REFERRING PROVIDER: Coral Spikes, DO [facility]   REFERRING DIAG: R26.9 (ICD-10-CM) - Gait difficulty   THERAPY DIAG:  Muscle weakness (generalized)  Localized edema  Rationale for Evaluation and Treatment Rehabilitation  ONSET DATE: July 2023  SUBJECTIVE:   SUBJECTIVE STATEMENT: States back hurts worse when it is cold and rainy. Had dialysis today so pretty tired. Feels good with HEP. Hopeful to get strong enough for renal transplant list.  Having cataract surgery in the near future (date unknown,) and will keep [place] updated.    PERTINENT HISTORY: Started dialysis July Monday wed and Friday Fistula right side of chest and then will eventually use right arm where new fistula placed  PAIN:  Are you having pain? Yes: NPRS scale: 3/10 Pain location: back Pain description: sore Aggravating factors: unknown Relieving factors: unknown  PRECAUTIONS: Fall  WEIGHT BEARING RESTRICTIONS No  FALLS:  Has patient fallen in last 6 months? Yes. Number of falls 2  LIVING ENVIRONMENT: Lives with: lives with their family Lives in: House/apartment Stairs: Yes: External: 2 steps; none Has following equipment at home: Single point cane, Walker - 2 wheeled, and bed side commode  OCCUPATION: disability  PLOF: Independent with basic ADLs  PATIENT GOALS get my legs stronger   OBJECTIVE:   DIAGNOSTIC FINDINGS: no recent  PATIENT SURVEYS:  LEFS  46/80  COGNITION:  Overall cognitive status: Within functional limits for tasks assessed     SENSATION: WFL  EDEMA:  Legs much smaller than previous visits    LOWER EXTREMITY MMT:  MMT Right eval Left eval Right  03/02/22 Left 03/02/22  Hip flexion 3- 3- 3/5 3-  Hip extension   2+ 2+  Hip abduction   3- 3-  Hip adduction      Hip internal rotation      Hip external rotation      Knee flexion   3- 3-  Knee extension 4 4 4/5 4/5  Ankle dorsiflexion 3+ 4 2+ 2+  Ankle plantarflexion      Ankle inversion      Ankle eversion       (Blank rows = not tested)  FUNCTIONAL TESTS:  5 times sit to stand: 31.94 sec uses hands to assist 2 minute walk test: 220 ft   03/02/22: 03/02/22: 5 STS 24.85" required hands on lap, WBOS and increased height to complete without HHA. 2MWT: 141ft with SPC  GAIT: Distance walked: 220 ft Assistive device utilized: Single point cane Level of assistance: SBA Comments: increased lateral trunk movement noted; uses SPC left hand    TODAY'S TREATMENT: 03/26/22 Bridge 3  x 10 Sidelying clamshell 3x10 Prone superman, hand at hips 2x10 SLR 2x10   Sitting:  sit to stand no UE assist 2 x 10 bed elevated  03/23/22 Bridge 3 x 10 Sidelying clamshell 3x10 Prone superman, hand at hips 2x10 SLR 3x10   Sitting:  sit to stand no UE assist 2 x 10 bed elevated  Standing : Mountain climbers leaning on wall x30 Forward marching with opposite arm raise x 3 RT Side stepping with RTB around knees x 4 RT    03/16/22 Knee to chest 3 x 30" Hamstring stretch 3 x 30"  LTR x 10  Bridge x 10 Sitting:  sit to stand no UE assist x 10 bed elevated enough to prevent rocking forward Standing : Marching with opposite arm raise x 10 Functional squat with heel raises 2x 10  Side stepping x 2 RT    PATIENT EDUCATION:  Education details: Patient educated on exam findings, POC, scope of PT, HEP. Person educated: Patient Education method: Explanation,  Demonstration, and Handouts Education comprehension: verbalized understanding, returned demonstration, verbal cues required, and tactile cues required    HOME EXERCISE PROGRAM: Access Code: [email or web address] URL: [email or web address] Date: 03/23/2022 Prepared by: Candie Mile  Exercises - Clamshell  - 1 x daily - 7 x weekly - 3 sets - 10 reps - [place] on Counter  - 1 x daily - 7 x weekly - 3 sets - 10 reps - Marching with Same-Side Arm Raise  - 1 x daily - 7 x weekly - 3 sets - 10 reps  Access Code: [email or web address] URL: [email or web address]  03/10/2022 - Supine Single Knee to Chest Stretch  - 2 x daily - 7 x weekly - 1 sets - 3 reps - 30" hold - Supine Hamstring Stretch  - 2 x daily - 7 x weekly - 1 sets - 3 reps - 30" hold - Supine Lower Trunk Rotation  - 2 x daily - 7 x weekly - 1 sets - 10 reps - 3-5" hold 02/18/22 - Hip Abduction with Resistance Loop  - 2 x daily - 7 x weekly - 2 sets - 10 reps - Hip Extension with Resistance Loop  - 2 x daily - 7 x weekly - 2 sets - 10 reps - Tandem Stance with Support  - 2 x daily - 7 x weekly - 1 sets - 3 reps - 30 seconds hold   02/11/22 - Supine Bridge  - 2 x daily - 7 x weekly - 2 sets - 10 reps - Small Range Straight Leg Raise  - 2 x daily - 7 x weekly - 2 sets - 10 reps - Sidelying Hip Abduction  - 2 x daily - 7 x weekly - 2 sets - 10 reps - Standing Heel Raise with Support  - 2 x daily - 7 x weekly - 2 sets - 10 reps  Date: 02/02/2022 Prepared by: [facility]  Exercises - Sit to Stand with Armchair  - 2 x daily - 7 x weekly - 1 sets - 10 reps - Seated March  - 2 x daily - 7 x weekly - 1 sets - 10 reps - Seated Heel Toe Raises  - 2 x daily - 7 x weekly - 1 sets - 10 reps - Seated Long Arc Quad  - 2 x daily - 7 x weekly - 1 sets - 10 reps  ASSESSMENT:  CLINICAL IMPRESSION: Further training for LE strength, endurance, functional mobility, and core. Pt more  fatigued today. Required additional therapeutic  rest periods for recovery between sets and exercises. Patient will continue to benefit from skilled physical therapy services to further improve independence with functional mobility.      OBJECTIVE IMPAIRMENTS Abnormal gait, decreased activity tolerance, decreased balance, decreased coordination, decreased endurance, decreased knowledge of use of DME, decreased mobility, difficulty walking, decreased ROM, decreased strength, decreased safety awareness, hypomobility, impaired perceived functional ability, impaired flexibility, and pain.   ACTIVITY LIMITATIONS carrying, lifting, bending, sitting, standing, squatting, sleeping, stairs, transfers, bed mobility, bathing, locomotion level, and caring for others  PARTICIPATION LIMITATIONS: meal prep, cleaning, laundry, driving, shopping, and community activity    REHAB POTENTIAL: Good  CLINICAL DECISION MAKING: Stable/uncomplicated  EVALUATION COMPLEXITY: Low   GOALS: Goals reviewed with patient? No  SHORT TERM GOALS: Target date: 02/16/2022   patient will be independent with initial HEP Baseline: 03/02/22: Reports compliance with HEP daily Goal status: IN PROGRESS  2.  Patient will improve 5 x STS score from 30.94 sec to 25 sec to demonstrate improved functional mobility and increased lower extremity strength.  Baseline: 03/02/22: 5 STS 24.85" required hands on lap, WBOS and increased height to 26in complete without HHA. Goal status: IN PROGRESS   LONG TERM GOALS: Target date: 03/02/2022   Patient will be independent in self management strategies to improve quality of life and functional outcomes. Baseline:  Goal status: IN PROGRESS  2.  Patient will improve 5 x STS score from 30.94 sec to 20 sec to demonstrate improved functional mobility and increased lower extremity strength. Baseline: 03/02/22: 5 STS 24.85" required hands on lap, WBOS and increased height to complete without HHA. Goal status: IN PROGRESS  3.  Patient will  report at least 50% improvement in overall symptoms and/or function to demonstrate improved functional mobility Baseline:  03/02/22: 20-25% improvements Goal status: IN PROGRESS  4.  Patient will increase 2 MWT distance to 300 ft to demonstrate improved functional mobility/ ambulation in community Baseline: 220 ft; 03/02/22: 2MWT 170ft with SPC Goal status: IN PROGRESS  5.  Patient will increase hip flexion strength to 4/5 to be able to improve efficiency of swing through phase with ambulation. Baseline: 3- Goal status: IN PROGRESS   PLAN: PT FREQUENCY: 2x/week  PT DURATION: 4 weeks  PLANNED INTERVENTIONS: Therapeutic exercises, Therapeutic activity, Neuromuscular re-education, Balance training, Gait training, Patient/Family education, Joint manipulation, Joint mobilization, Stair training, Orthotic/Fit training, DME instructions, Aquatic Therapy, Dry Needling, Electrical stimulation, Spinal manipulation, Spinal mobilization, Cryotherapy, Moist heat, Compression bandaging, scar mobilization, Splintting, Taping, Traction, Ultrasound, Ionotophoresis 4mg /ml Dexamethasone, and Manual therapy  PLAN FOR NEXT SESSION: Hip and lower extremity strengthening; patient with port right anterior chest wall and new fistula right upper extremity. Chart indicates Progress note on visit 17*?  Candie Mile, PT, DPT Physical Therapist [facility]

## 2022-03-29 ENCOUNTER — Encounter (HOSPITAL_COMMUNITY): Payer: Medicare Other | Admitting: Physical Therapy

## 2022-03-31 ENCOUNTER — Encounter (HOSPITAL_COMMUNITY): Payer: Medicare Other | Admitting: Physical Therapy

## 2022-04-05 ENCOUNTER — Encounter (HOSPITAL_COMMUNITY): Payer: Medicare Other | Admitting: Physical Therapy

## 2022-04-05 DIAGNOSIS — N186 End stage renal disease: Secondary | ICD-10-CM | POA: Diagnosis not present

## 2022-04-05 DIAGNOSIS — E1040 Type 1 diabetes mellitus with diabetic neuropathy, unspecified: Secondary | ICD-10-CM | POA: Diagnosis not present

## 2022-04-05 DIAGNOSIS — D509 Iron deficiency anemia, unspecified: Secondary | ICD-10-CM | POA: Diagnosis not present

## 2022-04-05 DIAGNOSIS — N2581 Secondary hyperparathyroidism of renal origin: Secondary | ICD-10-CM | POA: Diagnosis not present

## 2022-04-05 DIAGNOSIS — Z992 Dependence on renal dialysis: Secondary | ICD-10-CM | POA: Diagnosis not present

## 2022-04-05 DIAGNOSIS — D631 Anemia in chronic kidney disease: Secondary | ICD-10-CM | POA: Diagnosis not present

## 2022-04-06 ENCOUNTER — Ambulatory Visit (HOSPITAL_COMMUNITY): Payer: Medicare Other | Admitting: Physical Therapy

## 2022-04-06 DIAGNOSIS — M6281 Muscle weakness (generalized): Secondary | ICD-10-CM | POA: Diagnosis not present

## 2022-04-06 DIAGNOSIS — R2689 Other abnormalities of gait and mobility: Secondary | ICD-10-CM

## 2022-04-06 DIAGNOSIS — R600 Localized edema: Secondary | ICD-10-CM | POA: Diagnosis not present

## 2022-04-06 DIAGNOSIS — H2512 Age-related nuclear cataract, left eye: Secondary | ICD-10-CM | POA: Diagnosis not present

## 2022-04-06 NOTE — Addendum Note (Signed)
Addended byKarn Cassis S on: 04/06/2022 10:47 AM   Modules accepted: Orders

## 2022-04-06 NOTE — Therapy (Addendum)
OUTPATIENT PHYSICAL THERAPY LOWER EXTREMITY TREATMENT Progress Note Reporting Period 03/02/2022 to 04/06/2022  See note below for Objective Data and Assessment of Progress/Goals.      Patient Name: Lindsey Murillo MRN: [id number] DOB:1995-09-11, 26 y.o., female Today's Date: 04/06/2022   END OF SESSION:   PT End of Session - 04/06/22 7711     Visit Number 11    Number of Visits 19    Date for PT Re-Evaluation 05/07/22    Authorization Type [employer]; no auth, 35 visit limit PT/ OT/ ST    Progress Note Due on Visit 21    PT Start Time 0820    PT Stop Time 0907    PT Time Calculation (min) 47 min    Activity Tolerance Treatment limited secondary to medical complications (Comment)    Behavior During Therapy [facility] for tasks assessed/performed               Past Medical History:  Diagnosis Date   Acanthosis nigricans, acquired    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 chronic kidney disease ([place]) 09/25/2021   dialysis M-W-F at [facility] in [place]   Asthma    Diabetic autonomic neuropathy ([name])    Diabetic peripheral neuropathy ([place])    Environmental allergies    Goiter    History of blood transfusion    Hypertension    no meds currently   Hypoglycemia associated with diabetes ([place])    Tachycardia    Thyroiditis, autoimmune    Type 1 diabetes mellitus in patient age 25-19 years with HbA1C goal below 7.5    Past Surgical History:  Procedure Laterality Date   AV FISTULA PLACEMENT Right 12/08/2021   Procedure: ARTERIOVENOUS  FISTULA CREATION VERSUS GRAFT;  Surgeon: [name], MD;  Location: [facility];  Service: Vascular;  Laterality: Right;   [place] Right 01/12/2022   Procedure: RIGHT ARM SECOND STAGE BASILIC VEIN TRANSPOSITION;  Surgeon: [name], MD;  Location: [facility];  Service: Vascular;  Laterality: Right;   BIOPSY  08/27/2016   Procedure: BIOPSY;  Surgeon: Danie Binder, MD;  Location: [facility];  Service: Endoscopy;;  duodenum;  gastric   BIOPSY  06/27/2019   Procedure: BIOPSY;  Surgeon: Daneil Dolin, MD;  [facility];  Service: Endoscopy;;   BIOPSY  02/09/2022   Procedure: BIOPSY;  Surgeon: Eloise Harman, DO;  [facility];  Service: Endoscopy;;   BREAST CYST EXCISION Right 11/12/2021   Procedure: RIGHT BREAST ABSCESS INCISION AND DRAINAGE;  Surgeon: Rolm Bookbinder, MD;  Location: [place];  Service: General;  Laterality: Right;  LMA   COLONOSCOPY     COLONOSCOPY WITH PROPOFOL N/A 02/09/2022   Procedure: COLONOSCOPY WITH PROPOFOL;  Surgeon: Eloise Harman, DO;  [facility];  Service: Endoscopy;  Laterality: N/A;  7:30am, asa 3, dialysis pt   ESOPHAGOGASTRODUODENOSCOPY N/A 08/27/2016   Dr. Oneida Alar: mild gastritis. Negative celiac. No obvious source for dyspepsia/diarrhea   ESOPHAGOGASTRODUODENOSCOPY (EGD) WITH PROPOFOL N/A 06/27/2019   rourk: Focal abnormality of the gastric mucosa likely due to trauma (heaving).  Biopsy showed mild gastritis, negative for H. pylori.  Esophageal dilation for history of dysphagia but normal-appearing esophagus.   ESOPHAGOGASTRODUODENOSCOPY (EGD) WITH PROPOFOL N/A 02/09/2022   Procedure: ESOPHAGOGASTRODUODENOSCOPY (EGD) WITH PROPOFOL;  Surgeon: Eloise Harman, DO;  [facility];  Service: Endoscopy;  Laterality: N/A;   IR FLUORO GUIDE CV LINE RIGHT  10/13/2021   IR FLUORO GUIDE  CV LINE RIGHT  10/19/2021   IR US GUIDE VASC ACCESS RIGHT  10/13/2021   IRRIGATION AND DEBRIDEMENT ABSCESS N/A 10/04/2021   Procedure: IRRIGATION AND DEBRIDEMENT NECK ABSCESS;  Surgeon: Ralene Ok, MD;  Location: [place];  Service: General;  Laterality: N/A;   Patient Active Problem List   Diagnosis Date Noted   Non-adherence to medical treatment 01/21/2022   Esophageal dysphagia 01/19/2022   ESRD (end stage renal disease) ([place]) 12/18/2021   Abscess of right breast 12/18/2021   Physical deconditioning 10/07/2021   Protein-calorie malnutrition, severe ([place])  10/02/2021   Depression 09/25/2021   Anemia in ESRD (end-stage renal disease) ([place]) 09/25/2021   Normocytic anemia 06/26/2021   Peripheral edema 06/13/2021   Iron deficiency anemia 06/02/2021   Nephrotic syndrome 05/27/2021   Chronic pancreatitis ([place]) 08/22/2019   Pancreatic pseudocyst/cyst 08/22/2019   GERD (gastroesophageal reflux disease) 08/22/2019   Vitamin D deficiency 03/20/2019   Uncontrolled type 1 diabetes mellitus with hyperglycemia ([place]) 05/12/2015   Essential hypertension, benign 11/30/2012   Diabetic peripheral neuropathy ([place])    Asthma     PCP: Coral Spikes, DO [facility]   REFERRING PROVIDER: Coral Spikes, DO [facility]   REFERRING DIAG: R26.9 (ICD-10-CM) - Gait difficulty   THERAPY DIAG:  Muscle weakness (generalized)  Localized edema  Other abnormalities of gait and mobility  Rationale for Evaluation and Treatment Rehabilitation  ONSET DATE: July 2023  SUBJECTIVE:   SUBJECTIVE STATEMENT: Pt states she missed appts the last several weeks due to stomach bug and being tired from dialysis.  States she did not do much eating over Thanksgiving but did a lot of sleeping.  States she feels 40% improved.    Having cataract surgery in the near future (date unknown,) and will keep [place] updated.  Pt is on the renal transplant list and completes diaysis MWF.    PERTINENT HISTORY: Started dialysis July Monday wed and Friday Fistula right side of chest and then will eventually use right arm where new fistula placed  PAIN:  Are you having pain? Yes: NPRS scale: 5/10 Pain location: Lt axillary region, Lt medial quad Pain description: sore Aggravating factors: unknown Relieving factors: unknown  PRECAUTIONS: Fall  WEIGHT BEARING RESTRICTIONS No  FALLS:  Has patient fallen in last 6 months? Yes. Number of falls 2  LIVING ENVIRONMENT: Lives with: lives with their family Lives in: House/apartment Stairs: Yes: External: 2  steps; none Has following equipment at home: Single point cane, Walker - 2 wheeled, and bed side commode  OCCUPATION: disability  PLOF: Independent with basic ADLs  PATIENT GOALS get my legs stronger   OBJECTIVE:   DIAGNOSTIC FINDINGS: no recent  PATIENT SURVEYS:  LEFS 11/28:  47/80;  evaluation: 46/80  COGNITION:  Overall cognitive status: Within functional limits for tasks assessed     SENSATION: WFL  EDEMA:  Legs much smaller than previous visits    LOWER EXTREMITY MMT:  MMT Right eval Left eval Right  03/02/22 Left 03/02/22 Right 04/06/22 Left 04/06/22  Hip flexion 3- 3- 3/5 3- 3 3  Hip extension   2+ 2+ 3- 3+  Hip abduction   3- 3- 3- 3-  Hip adduction        Hip internal rotation        Hip external rotation        Knee flexion   3- 3- 3 3+  Knee extension 4 4 4/5 4/5 4+ 4+  Ankle dorsiflexion 3+ 4 2+ 2+  3- 3-  Ankle plantarflexion        Ankle inversion        Ankle eversion         (Blank rows = not tested)  FUNCTIONAL TESTS:  5 times sit to stand: 31.94 sec uses hands to assist 2 minute walk test: 220 ft   03/02/22: 5 STS 24.85" required hands on lap, WBOS and increased height to complete without HHA. 2MWT: 117[facility]: 5STS 19.08 from elevated edge of bed (25 inches), hands on lap, WBOS no UE 2MWT:  315 feet with SPC  Assistive device utilized: Single point cane Level of assistance: SBA Comments: increased lateral trunk movement noted; uses SPC left hand    TODAY'S TREATMENT: 04/06/22 Re-evaluation/progress note Functional test measures: 04/06/22: 5STS 19.08 from elevated edge of bed (25 inches), hands on lap, WBOS no UE 2MWT:  315 feet with SPC LEFS:  47/80 (was 46/80 at evaluation) MMT (see above) Goal review HEP review   03/26/22 Bridge 3 x 10 Sidelying clamshell 3x10 Prone superman, hand at hips 2x10 SLR 2x10   Sitting:  sit to stand no UE assist 2 x 10 bed elevated  03/23/22 Bridge 3 x 10 Sidelying  clamshell 3x10 Prone superman, hand at hips 2x10 SLR 3x10   Sitting:  sit to stand no UE assist 2 x 10 bed elevated  Standing : Mountain climbers leaning on wall x30 Forward marching with opposite arm raise x 3 RT Side stepping with RTB around knees x 4 RT    03/16/22 Knee to chest 3 x 30" Hamstring stretch 3 x 30"  LTR x 10  Bridge x 10 Sitting:  sit to stand no UE assist x 10 bed elevated enough to prevent rocking forward Standing : Marching with opposite arm raise x 10 Functional squat with heel raises 2x 10  Side stepping x 2 RT    PATIENT EDUCATION:  Education details: Patient educated on exam findings, POC, scope of PT, HEP. Person educated: Patient Education method: Explanation, Demonstration, and Handouts Education comprehension: verbalized understanding, returned demonstration, verbal cues required, and tactile cues required    HOME EXERCISE PROGRAM: Access Code: [email or web address] URL: [email or web address] Date: 03/23/2022 Prepared by: LCandie Mile Exercises - Clamshell  - 1 x daily - 7 x weekly - 3 sets - 10 reps - M[place] Counter  - 1 x daily - 7 x weekly - 3 sets - 10 reps - Marching with Same-Side Arm Raise  - 1 x daily - 7 x weekly - 3 sets - 10 reps  Access Code: [email or web address] URL: [email or web address]  03/10/2022 - Supine Single Knee to Chest Stretch  - 2 x daily - 7 x weekly - 1 sets - 3 reps - 30" hold - Supine Hamstring Stretch  - 2 x daily - 7 x weekly - 1 sets - 3 reps - 30" hold - Supine Lower Trunk Rotation  - 2 x daily - 7 x weekly - 1 sets - 10 reps - 3-5" hold 02/18/22 - Hip Abduction with Resistance Loop  - 2 x daily - 7 x weekly - 2 sets - 10 reps - Hip Extension with Resistance Loop  - 2 x daily - 7 x weekly - 2 sets - 10 reps - Tandem Stance with Support  - 2 x daily - 7 x weekly - 1 sets - 3 reps - 30 seconds hold   02/11/22 - Supine Bridge  - 2 x daily - 7 x  weekly - 2 sets - 10 reps - Small Range  Straight Leg Raise  - 2 x daily - 7 x weekly - 2 sets - 10 reps - Sidelying Hip Abduction  - 2 x daily - 7 x weekly - 2 sets - 10 reps - Standing Heel Raise with Support  - 2 x daily - 7 x weekly - 2 sets - 10 reps  Date: 02/02/2022 Prepared by: [facility] - [facility]  Exercises - Sit to Stand with Armchair  - 2 x daily - 7 x weekly - 1 sets - 10 reps - Seated March  - 2 x daily - 7 x weekly - 1 sets - 10 reps - Seated Heel Toe Raises  - 2 x daily - 7 x weekly - 1 sets - 10 reps - Seated Long Arc Quad  - 2 x daily - 7 x weekly - 1 sets - 10 reps  ASSESSMENT:  CLINICAL IMPRESSION: Reassessment completed this session with overall progress noted.  Pt has improved he walking distance/tolerance, time to complete STS and strength with MMT.  Pt is still challenged with weakness and admits to not being fully compliant with HEP.  Pt with most difficulty getting up from seated and requires elevated surface without use of UE's.  Instructed with stretch and self massage for tight Lt medial hamstring today.  Visualized pt lifting LE's with UE's into bed, instructed to discontinue this and use her mm in her LE.  Pt also encouraged to complete her activity at home and push herself with her exercises.  Pt has met 1/2 STG's and 2/5 LTG's.  Pt will benefit from 4 more weeks of physical therapy to further increase  LE strength, endurance, functional mobility, and core.  OBJECTIVE IMPAIRMENTS Abnormal gait, decreased activity tolerance, decreased balance, decreased coordination, decreased endurance, decreased knowledge of use of DME, decreased mobility, difficulty walking, decreased ROM, decreased strength, decreased safety awareness, hypomobility, impaired perceived functional ability, impaired flexibility, and pain.   ACTIVITY LIMITATIONS carrying, lifting, bending, sitting, standing, squatting, sleeping, stairs, transfers, bed mobility, bathing, locomotion level, and caring for others  PARTICIPATION LIMITATIONS: meal prep,  cleaning, laundry, driving, shopping, and community activity    REHAB POTENTIAL: Good  CLINICAL DECISION MAKING: Stable/uncomplicated  EVALUATION COMPLEXITY: Low   GOALS: Goals reviewed with patient? No  SHORT TERM GOALS: Target date: 02/16/2022   patient will be independent with initial HEP Baseline: 03/02/22: Reports compliance with HEP daily (11/28:  reports completing every other day) Goal status: IN PROGRESS  2.  Patient will improve 5 x STS score from 30.94 sec to 25 sec to demonstrate improved functional mobility and increased lower extremity strength.  Baseline: 03/02/22: 5 STS 24.85" required hands on lap, WBOS and increased height to 26in complete without HHA. Goal status: MET   LONG TERM GOALS: Target date: 05/07/2022   Patient will be independent in self management strategies to improve quality of life and functional outcomes. Baseline:  Goal status: IN PROGRESS  2.  Patient will improve 5 x STS score from 30.94 sec to 20 sec to demonstrate improved functional mobility and increased lower extremity strength. Baseline: 03/02/22: 5 STS 24.85" required hands on lap, WBOS and increased height to complete without HHA. Goal status: MET  3.  Patient will report at least 50% improvement in overall symptoms and/or function to demonstrate improved functional mobility Baseline:  03/02/22: 20-25% improvements Goal status: IN PROGRESS  4.  Patient will increase 2 MWT distance to 300 ft to demonstrate  improved functional mobility/ ambulation in community Baseline: 220 ft; 03/02/22: 2MWT 123f with SPC Goal status: MET  5.  Patient will increase hip flexion strength to 4/5 to be able to improve efficiency of swing through phase with ambulation. Baseline: 3- Goal status: IN PROGRESS   PLAN: PT FREQUENCY: 2x/week  PT DURATION: 4 weeks  PLANNED INTERVENTIONS: Therapeutic exercises, Therapeutic activity, Neuromuscular re-education, Balance training, Gait training,  Patient/Family education, Joint manipulation, Joint mobilization, Stair training, Orthotic/Fit training, DME instructions, Aquatic Therapy, Dry Needling, Electrical stimulation, Spinal manipulation, Spinal mobilization, Cryotherapy, Moist heat, Compression bandaging, scar mobilization, Splintting, Taping, Traction, Ultrasound, Ionotophoresis 458mml Dexamethasone, and Manual therapy  PLAN FOR NEXT SESSION: Hip and lower extremity strengthening; patient with port right anterior chest wall and new fistula right upper extremity.  Continue 2X week for 4 more weeks.   AmTeena IraniPTA/CLT CoAritonh: [phone] IraniPTA 04/06/2022, 9:51 AM  10:46 AM, 04/06/22 Elisah Parmer Small Lynch MPT [facility] [place] #[phone]

## 2022-04-07 ENCOUNTER — Encounter (HOSPITAL_COMMUNITY): Payer: Medicare Other | Admitting: Physical Therapy

## 2022-04-07 DIAGNOSIS — Z992 Dependence on renal dialysis: Secondary | ICD-10-CM | POA: Diagnosis not present

## 2022-04-07 DIAGNOSIS — N2581 Secondary hyperparathyroidism of renal origin: Secondary | ICD-10-CM | POA: Diagnosis not present

## 2022-04-07 DIAGNOSIS — D509 Iron deficiency anemia, unspecified: Secondary | ICD-10-CM | POA: Diagnosis not present

## 2022-04-07 DIAGNOSIS — E1040 Type 1 diabetes mellitus with diabetic neuropathy, unspecified: Secondary | ICD-10-CM | POA: Diagnosis not present

## 2022-04-07 DIAGNOSIS — N186 End stage renal disease: Secondary | ICD-10-CM | POA: Diagnosis not present

## 2022-04-07 DIAGNOSIS — D631 Anemia in chronic kidney disease: Secondary | ICD-10-CM | POA: Diagnosis not present

## 2022-04-08 ENCOUNTER — Ambulatory Visit (HOSPITAL_COMMUNITY): Payer: Medicare Other | Admitting: Physical Therapy

## 2022-04-08 DIAGNOSIS — R2689 Other abnormalities of gait and mobility: Secondary | ICD-10-CM | POA: Diagnosis not present

## 2022-04-08 DIAGNOSIS — M6281 Muscle weakness (generalized): Secondary | ICD-10-CM | POA: Diagnosis not present

## 2022-04-08 DIAGNOSIS — R600 Localized edema: Secondary | ICD-10-CM | POA: Diagnosis not present

## 2022-04-08 NOTE — Therapy (Signed)
OUTPATIENT PHYSICAL THERAPY LOWER EXTREMITY TREATMENT  See note below for Objective Data and Assessment of Progress/Goals.      Patient Name: Lindsey Murillo MRN: [id number] DOB:06/03/1995, 26 y.o., female Today's Date: 04/08/2022   END OF SESSION:   PT End of Session - 04/08/22 1309     Visit Number 12    Number of Visits 19    Date for PT Re-Evaluation 05/07/22    Authorization Type [employer]; no auth, 35 visit limit PT/ OT/ ST    Progress Note Due on Visit 21    PT Start Time 6644    PT Stop Time 1344    PT Time Calculation (min) 38 min    Activity Tolerance Patient tolerated treatment well    Behavior During Therapy WFL for tasks assessed/performed               Past Medical History:  Diagnosis Date   Acanthosis nigricans, acquired    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 chronic kidney disease ([place]) 09/25/2021   dialysis M-W-F at [facility] care in [place]   Asthma    Diabetic autonomic neuropathy ([name])    Diabetic peripheral neuropathy ([place])    Environmental allergies    Goiter    History of blood transfusion    Hypertension    no meds currently   Hypoglycemia associated with diabetes ([place])    Tachycardia    Thyroiditis, autoimmune    Type 1 diabetes mellitus in patient age 54-19 years with HbA1C goal below 7.5    Past Surgical History:  Procedure Laterality Date   AV FISTULA PLACEMENT Right 12/08/2021   Procedure: ARTERIOVENOUS  FISTULA CREATION VERSUS GRAFT;  Surgeon: [name], MD;  Location: [facility];  Service: Vascular;  Laterality: Right;   [place] Right 01/12/2022   Procedure: RIGHT ARM SECOND STAGE BASILIC VEIN TRANSPOSITION;  Surgeon: [name], MD;  Location: [facility];  Service: Vascular;  Laterality: Right;   BIOPSY  08/27/2016   Procedure: BIOPSY;  Surgeon: Danie Binder, MD;  [facility];  Service: Endoscopy;;  duodenum; gastric   BIOPSY  06/27/2019   Procedure: BIOPSY;  Surgeon: Daneil Dolin, MD;   [facility];  Service: Endoscopy;;   BIOPSY  02/09/2022   Procedure: BIOPSY;  Surgeon: Eloise Harman, DO;  [facility];  Service: Endoscopy;;   BREAST CYST EXCISION Right 11/12/2021   Procedure: RIGHT BREAST ABSCESS INCISION AND DRAINAGE;  Surgeon: Rolm Bookbinder, MD;  Location: [place];  Service: General;  Laterality: Right;  LMA   COLONOSCOPY     COLONOSCOPY WITH PROPOFOL N/A 02/09/2022   Procedure: COLONOSCOPY WITH PROPOFOL;  Surgeon: Eloise Harman, DO;  [facility];  Service: Endoscopy;  Laterality: N/A;  7:30am, asa 3, dialysis pt   ESOPHAGOGASTRODUODENOSCOPY N/A 08/27/2016   Dr. Oneida Alar: mild gastritis. Negative celiac. No obvious source for dyspepsia/diarrhea   ESOPHAGOGASTRODUODENOSCOPY (EGD) WITH PROPOFOL N/A 06/27/2019   rourk: Focal abnormality of the gastric mucosa likely due to trauma (heaving).  Biopsy showed mild gastritis, negative for H. pylori.  Esophageal dilation for history of dysphagia but normal-appearing esophagus.   ESOPHAGOGASTRODUODENOSCOPY (EGD) WITH PROPOFOL N/A 02/09/2022   Procedure: ESOPHAGOGASTRODUODENOSCOPY (EGD) WITH PROPOFOL;  Surgeon: Eloise Harman, DO;  [facility];  Service: Endoscopy;  Laterality: N/A;   IR FLUORO GUIDE CV LINE RIGHT  10/13/2021   IR FLUORO GUIDE CV LINE RIGHT  10/19/2021   IR US GUIDE  VASC ACCESS RIGHT  10/13/2021   IRRIGATION AND DEBRIDEMENT ABSCESS N/A 10/04/2021   Procedure: IRRIGATION AND DEBRIDEMENT NECK ABSCESS;  Surgeon: Ralene Ok, MD;  Location: [place];  Service: General;  Laterality: N/A;   Patient Active Problem List   Diagnosis Date Noted   Non-adherence to medical treatment 01/21/2022   Esophageal dysphagia 01/19/2022   ESRD (end stage renal disease) ([place]) 12/18/2021   Abscess of right breast 12/18/2021   Physical deconditioning 10/07/2021   Protein-calorie malnutrition, severe ([place]) 10/02/2021   Depression 09/25/2021   Anemia in ESRD (end-stage renal disease) ([place])  09/25/2021   Normocytic anemia 06/26/2021   Peripheral edema 06/13/2021   Iron deficiency anemia 06/02/2021   Nephrotic syndrome 05/27/2021   Chronic pancreatitis ([place]) 08/22/2019   Pancreatic pseudocyst/cyst 08/22/2019   GERD (gastroesophageal reflux disease) 08/22/2019   Vitamin D deficiency 03/20/2019   Uncontrolled type 1 diabetes mellitus with hyperglycemia ([place]) 05/12/2015   Essential hypertension, benign 11/30/2012   Diabetic peripheral neuropathy ([place])    Asthma     PCP: Coral Spikes, DO [facility]   REFERRING PROVIDER: Coral Spikes, DO [facility]   REFERRING DIAG: R26.9 (ICD-10-CM) - Gait difficulty   THERAPY DIAG:  Muscle weakness (generalized)  Other abnormalities of gait and mobility  Rationale for Evaluation and Treatment Rehabilitation  ONSET DATE: July 2023  SUBJECTIVE:   SUBJECTIVE STATEMENT: Doing ok. Has some trouble with her eye. Having surgery coming up. Feels like she may have pulled a muscle in her shoulder area.   Having cataract surgery in the near future (date unknown,) and will keep [place] updated.  Pt is on the renal transplant list and completes diaysis MWF.    PERTINENT HISTORY: Started dialysis July Monday wed and Friday Fistula right side of chest and then will eventually use right arm where new fistula placed  PAIN:  Are you having pain? Yes: NPRS scale: 2-3/10 Pain location: Lt axillary region Pain description: sore Aggravating factors: unknown Relieving factors: unknown  PRECAUTIONS: Fall  WEIGHT BEARING RESTRICTIONS No  FALLS:  Has patient fallen in last 6 months? Yes. Number of falls 2  LIVING ENVIRONMENT: Lives with: lives with their family Lives in: House/apartment Stairs: Yes: External: 2 steps; none Has following equipment at home: Single point cane, Walker - 2 wheeled, and bed side commode  OCCUPATION: disability  PLOF: Independent with basic ADLs  PATIENT GOALS get my legs  stronger   OBJECTIVE:   DIAGNOSTIC FINDINGS: no recent  PATIENT SURVEYS:  LEFS 11/28:  47/80;  evaluation: 46/80  COGNITION:  Overall cognitive status: Within functional limits for tasks assessed     SENSATION: WFL  EDEMA:  Legs much smaller than previous visits    LOWER EXTREMITY MMT:  MMT Right eval Left eval Right  03/02/22 Left 03/02/22 Right 04/06/22 Left 04/06/22  Hip flexion 3- 3- 3/5 3- 3 3  Hip extension   2+ 2+ 3- 3+  Hip abduction   3- 3- 3- 3-  Hip adduction        Hip internal rotation        Hip external rotation        Knee flexion   3- 3- 3 3+  Knee extension 4 4 4/5 4/5 4+ 4+  Ankle dorsiflexion 3+ 4 2+ 2+ 3- 3-  Ankle plantarflexion        Ankle inversion        Ankle eversion         (Blank rows =  not tested)  FUNCTIONAL TESTS:  5 times sit to stand: 31.94 sec uses hands to assist 2 minute walk test: 220 ft   03/02/22: 5 STS 24.85" required hands on lap, WBOS and increased height to complete without HHA. 2MWT[facility] 5STS 19.08 from elevated edge of bed (25 inches), hands on lap, WBOS no UE 2MWT:  315 feet with SPC  Assistive device utilized: Single point cane Level of assistance: SBA Comments: increased lateral trunk movement noted; uses SPC left hand    TODAY'S TREATMENT: 04/08/22 Rec bike 4 min  Heel raise 2 x 10 Toe raise 2 x10 (unilateral)  Mini squat 2 x 10 Standing hip flexion (SLR) GTB 2 x 10 Standing hip abduction GTB 2 x 10 Standing hip extension BTB 2 x 10 Step up 6 inch 2 x 10   04/06/22 Re-evaluation/progress note Functional test measures: 04/06/22: 5STS 19.08 from elevated edge of bed (25 inches), hands on lap, WBOS no UE 2MWT:  315 feet with SPC LEFS:  47/80 (was 46/80 at evaluation) MMT (see above) Goal review HEP review   03/26/22 Bridge 3 x 10 Sidelying clamshell 3x10 Prone superman, hand at hips 2x10 SLR 2x10   Sitting:  sit to stand no UE assist 2 x 10 bed elevated  PATIENT  EDUCATION:  Education details: Patient educated on exam findings, POC, scope of PT, HEP. Person educated: Patient Education method: Explanation, Demonstration, and Handouts Education comprehension: verbalized understanding, returned demonstration, verbal cues required, and tactile cues required    HOME EXERCISE PROGRAM: Access Code: [email or web address] URL: [email or web address] Date: 03/23/2022 Prepared by: LCandie Mile Exercises - Clamshell  - 1 x daily - 7 x weekly - 3 sets - 10 reps - [place]  - 1 x daily - 7 x weekly - 3 sets - 10 reps - Marching with Same-Side Arm Raise  - 1 x daily - 7 x weekly - 3 sets - 10 reps  Access Code: [email or web address] URL: [email or web address]  03/10/2022 - Supine Single Knee to Chest Stretch  - 2 x daily - 7 x weekly - 1 sets - 3 reps - 30" hold - Supine Hamstring Stretch  - 2 x daily - 7 x weekly - 1 sets - 3 reps - 30" hold - Supine Lower Trunk Rotation  - 2 x daily - 7 x weekly - 1 sets - 10 reps - 3-5" hold 02/18/22 - Hip Abduction with Resistance Loop  - 2 x daily - 7 x weekly - 2 sets - 10 reps - Hip Extension with Resistance Loop  - 2 x daily - 7 x weekly - 2 sets - 10 reps - Tandem Stance with Support  - 2 x daily - 7 x weekly - 1 sets - 3 reps - 30 seconds hold   02/11/22 - Supine Bridge  - 2 x daily - 7 x weekly - 2 sets - 10 reps - Small Range Straight Leg Raise  - 2 x daily - 7 x weekly - 2 sets - 10 reps - Sidelying Hip Abduction  - 2 x daily - 7 x weekly - 2 sets - 10 reps - Standing Heel Raise with Support  - 2 x daily - 7 x weekly - 2 sets - 10 reps  Date: 02/02/2022 Prepared by: [facility]  Exercises - Sit to Stand with Armchair  - 2 x daily - 7 x weekly - 1 sets - 10 reps - Seated March  -  2 x daily - 7 x weekly - 1 sets - 10 reps - Seated Heel Toe Raises  - 2 x daily - 7 x weekly - 1 sets - 10 reps - Seated Long Arc Quad  - 2 x daily - 7 x weekly - 1 sets - 10  reps  ASSESSMENT:  CLINICAL IMPRESSION: Patient tolerated session well today. Progressed standing LE strength with increased resistance band. Added steps and mini squats for functional LE strength progression. Patient educated on proper form and function of added mini squats. Patient will continue to benefit from skilled therapy services to reduce remaining deficits and improve functional ability.    OBJECTIVE IMPAIRMENTS Abnormal gait, decreased activity tolerance, decreased balance, decreased coordination, decreased endurance, decreased knowledge of use of DME, decreased mobility, difficulty walking, decreased ROM, decreased strength, decreased safety awareness, hypomobility, impaired perceived functional ability, impaired flexibility, and pain.   ACTIVITY LIMITATIONS carrying, lifting, bending, sitting, standing, squatting, sleeping, stairs, transfers, bed mobility, bathing, locomotion level, and caring for others  PARTICIPATION LIMITATIONS: meal prep, cleaning, laundry, driving, shopping, and community activity    REHAB POTENTIAL: Good  CLINICAL DECISION MAKING: Stable/uncomplicated  EVALUATION COMPLEXITY: Low   GOALS: Goals reviewed with patient? No  SHORT TERM GOALS: Target date: 02/16/2022   patient will be independent with initial HEP Baseline: 03/02/22: Reports compliance with HEP daily (11/28:  reports completing every other day) Goal status: IN PROGRESS  2.  Patient will improve 5 x STS score from 30.94 sec to 25 sec to demonstrate improved functional mobility and increased lower extremity strength.  Baseline: 03/02/22: 5 STS 24.85" required hands on lap, WBOS and increased height to 26in complete without HHA. Goal status: MET   LONG TERM GOALS: Target date: 05/07/2022   Patient will be independent in self management strategies to improve quality of life and functional outcomes. Baseline:  Goal status: IN PROGRESS  2.  Patient will improve 5 x STS score from  30.94 sec to 20 sec to demonstrate improved functional mobility and increased lower extremity strength. Baseline: 03/02/22: 5 STS 24.85" required hands on lap, WBOS and increased height to complete without HHA. Goal status: MET  3.  Patient will report at least 50% improvement in overall symptoms and/or function to demonstrate improved functional mobility Baseline:  03/02/22: 20-25% improvements Goal status: IN PROGRESS  4.  Patient will increase 2 MWT distance to 300 ft to demonstrate improved functional mobility/ ambulation in community Baseline: 220 ft; 03/02/22: 2MWT 178f with SPC Goal status: MET  5.  Patient will increase hip flexion strength to 4/5 to be able to improve efficiency of swing through phase with ambulation. Baseline: 3- Goal status: IN PROGRESS   PLAN: PT FREQUENCY: 2x/week  PT DURATION: 4 weeks  PLANNED INTERVENTIONS: Therapeutic exercises, Therapeutic activity, Neuromuscular re-education, Balance training, Gait training, Patient/Family education, Joint manipulation, Joint mobilization, Stair training, Orthotic/Fit training, DME instructions, Aquatic Therapy, Dry Needling, Electrical stimulation, Spinal manipulation, Spinal mobilization, Cryotherapy, Moist heat, Compression bandaging, scar mobilization, Splintting, Taping, Traction, Ultrasound, Ionotophoresis 48mml Dexamethasone, and Manual therapy  PLAN FOR NEXT SESSION: Hip and lower extremity strengthening; patient with port right anterior chest wall and new fistula right upper extremity.   1:42 PM, 04/08/22 CaJosue HectorT DPT  Physical Therapist with [facility]([phone]

## 2022-04-13 ENCOUNTER — Encounter (HOSPITAL_COMMUNITY): Payer: Medicare Other | Admitting: Physical Therapy

## 2022-04-14 ENCOUNTER — Encounter (HOSPITAL_COMMUNITY): Payer: Medicare Other | Admitting: Physical Therapy

## 2022-04-14 DIAGNOSIS — D509 Iron deficiency anemia, unspecified: Secondary | ICD-10-CM | POA: Diagnosis not present

## 2022-04-14 DIAGNOSIS — Z992 Dependence on renal dialysis: Secondary | ICD-10-CM | POA: Diagnosis not present

## 2022-04-14 DIAGNOSIS — N186 End stage renal disease: Secondary | ICD-10-CM | POA: Diagnosis not present

## 2022-04-14 DIAGNOSIS — E1040 Type 1 diabetes mellitus with diabetic neuropathy, unspecified: Secondary | ICD-10-CM | POA: Diagnosis not present

## 2022-04-14 DIAGNOSIS — N2581 Secondary hyperparathyroidism of renal origin: Secondary | ICD-10-CM | POA: Diagnosis not present

## 2022-04-14 DIAGNOSIS — D631 Anemia in chronic kidney disease: Secondary | ICD-10-CM | POA: Diagnosis not present

## 2022-04-15 ENCOUNTER — Ambulatory Visit (HOSPITAL_COMMUNITY): Payer: Medicare Other | Attending: Family Medicine | Admitting: Physical Therapy

## 2022-04-15 DIAGNOSIS — R2689 Other abnormalities of gait and mobility: Secondary | ICD-10-CM | POA: Insufficient documentation

## 2022-04-15 DIAGNOSIS — M6281 Muscle weakness (generalized): Secondary | ICD-10-CM | POA: Diagnosis not present

## 2022-04-15 DIAGNOSIS — R600 Localized edema: Secondary | ICD-10-CM | POA: Insufficient documentation

## 2022-04-15 NOTE — Therapy (Signed)
OUTPATIENT PHYSICAL THERAPY LOWER EXTREMITY TREATMENT     Patient Name: Lindsey Murillo MRN: [id number] DOB:17-Apr-1996, 26 y.o., female Today's Date: 04/15/2022   END OF SESSION:   PT End of Session - 04/15/22 1259     Visit Number 13    Number of Visits 19    Date for PT Re-Evaluation 05/07/22    Authorization Type [employer]; no auth, 35 visit limit PT/ OT/ ST    Progress Note Due on Visit 21    PT Start Time 1258    PT Stop Time 1340    PT Time Calculation (min) 42 min    Activity Tolerance Patient tolerated treatment well    Behavior During Therapy WFL for tasks assessed/performed               Past Medical History:  Diagnosis Date   Acanthosis nigricans, acquired    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 chronic kidney disease ([place]) 09/25/2021   dialysis M-W-F at Dialysis at [facility] in [place]   Asthma    Diabetic autonomic neuropathy ([name])    Diabetic peripheral neuropathy ([place])    Environmental allergies    Goiter    History of blood transfusion    Hypertension    no meds currently   Hypoglycemia associated with diabetes ([place])    Tachycardia    Thyroiditis, autoimmune    Type 1 diabetes mellitus in patient age 27-19 years with HbA1C goal below 7.5    Past Surgical History:  Procedure Laterality Date   AV FISTULA PLACEMENT Right 12/08/2021   Procedure: ARTERIOVENOUS  FISTULA CREATION VERSUS GRAFT;  Surgeon: [name], MD;  Location: [facility];  Service: Vascular;  Laterality: Right;   [place] Right 01/12/2022   Procedure: RIGHT ARM SECOND STAGE BASILIC VEIN TRANSPOSITION;  Surgeon: [name], MD;  Location: [facility];  Service: Vascular;  Laterality: Right;   BIOPSY  08/27/2016   Procedure: BIOPSY;  Surgeon: Danie Binder, MD;  Location: [facility];  Service: Endoscopy;;  duodenum; gastric   BIOPSY  06/27/2019   Procedure: BIOPSY;  Surgeon: Daneil Dolin, MD;  [facility];  Service: Endoscopy;;   BIOPSY  02/09/2022    Procedure: BIOPSY;  Surgeon: Eloise Harman, DO;  [facility];  Service: Endoscopy;;   BREAST CYST EXCISION Right 11/12/2021   Procedure: RIGHT BREAST ABSCESS INCISION AND DRAINAGE;  Surgeon: Rolm Bookbinder, MD;  Location: [place];  Service: General;  Laterality: Right;  LMA   COLONOSCOPY     COLONOSCOPY WITH PROPOFOL N/A 02/09/2022   Procedure: COLONOSCOPY WITH PROPOFOL;  Surgeon: Eloise Harman, DO;  [facility];  Service: Endoscopy;  Laterality: N/A;  7:30am, asa 3, dialysis pt   ESOPHAGOGASTRODUODENOSCOPY N/A 08/27/2016   Dr. Oneida Alar: mild gastritis. Negative celiac. No obvious source for dyspepsia/diarrhea   ESOPHAGOGASTRODUODENOSCOPY (EGD) WITH PROPOFOL N/A 06/27/2019   rourk: Focal abnormality of the gastric mucosa likely due to trauma (heaving).  Biopsy showed mild gastritis, negative for H. pylori.  Esophageal dilation for history of dysphagia but normal-appearing esophagus.   ESOPHAGOGASTRODUODENOSCOPY (EGD) WITH PROPOFOL N/A 02/09/2022   Procedure: ESOPHAGOGASTRODUODENOSCOPY (EGD) WITH PROPOFOL;  Surgeon: Eloise Harman, DO;  [facility];  Service: Endoscopy;  Laterality: N/A;   IR FLUORO GUIDE CV LINE RIGHT  10/13/2021   IR FLUORO GUIDE CV LINE RIGHT  10/19/2021   IR US GUIDE VASC ACCESS RIGHT  10/13/2021   IRRIGATION AND DEBRIDEMENT ABSCESS N/A  10/04/2021   Procedure: IRRIGATION AND DEBRIDEMENT NECK ABSCESS;  Surgeon: Ralene Ok, MD;  Location: [place];  Service: General;  Laterality: N/A;   Patient Active Problem List   Diagnosis Date Noted   Non-adherence to medical treatment 01/21/2022   Esophageal dysphagia 01/19/2022   ESRD (end stage renal disease) ([place]) 12/18/2021   Abscess of right breast 12/18/2021   Physical deconditioning 10/07/2021   Protein-calorie malnutrition, severe ([place]) 10/02/2021   Depression 09/25/2021   Anemia in ESRD (end-stage renal disease) ([place]) 09/25/2021   Normocytic anemia 06/26/2021   Peripheral edema  06/13/2021   Iron deficiency anemia 06/02/2021   Nephrotic syndrome 05/27/2021   Chronic pancreatitis ([place]) 08/22/2019   Pancreatic pseudocyst/cyst 08/22/2019   GERD (gastroesophageal reflux disease) 08/22/2019   Vitamin D deficiency 03/20/2019   Uncontrolled type 1 diabetes mellitus with hyperglycemia ([place]) 05/12/2015   Essential hypertension, benign 11/30/2012   Diabetic peripheral neuropathy ([place])    Asthma     PCP: Coral Spikes, DO [facility]   REFERRING PROVIDER: Coral Spikes, DO [facility]   REFERRING DIAG: R26.9 (ICD-10-CM) - Gait difficulty   THERAPY DIAG:  Muscle weakness (generalized)  Other abnormalities of gait and mobility  Localized edema  Rationale for Evaluation and Treatment Rehabilitation  ONSET DATE: July 2023  SUBJECTIVE:   SUBJECTIVE STATEMENT: Pt state she's missed her last 2 appt due to other appts.  Having eye surgery coming up.   Having cataract surgery in the near future (date unknown,) and will keep [place] updated.  Pt is on the renal transplant list and completes diaysis MWF.    PERTINENT HISTORY: Started dialysis July Monday wed and Friday Fistula right side of chest and then will eventually use right arm where new fistula placed  PAIN:  Are you having pain? Yes: NPRS scale: 2-3/10 Pain location: Lt axillary region Pain description: sore Aggravating factors: unknown Relieving factors: unknown  PRECAUTIONS: Fall  WEIGHT BEARING RESTRICTIONS No  FALLS:  Has patient fallen in last 6 months? Yes. Number of falls 2  LIVING ENVIRONMENT: Lives with: lives with their family Lives in: House/apartment Stairs: Yes: External: 2 steps; none Has following equipment at home: Single point cane, Walker - 2 wheeled, and bed side commode  OCCUPATION: disability  PLOF: Independent with basic ADLs  PATIENT GOALS get my legs stronger   OBJECTIVE:   DIAGNOSTIC FINDINGS: no recent  PATIENT SURVEYS:  LEFS  11/28:  47/80;  evaluation: 46/80  COGNITION:  Overall cognitive status: Within functional limits for tasks assessed     SENSATION: WFL  EDEMA:  Legs much smaller than previous visits    LOWER EXTREMITY MMT:  MMT Right eval Left eval Right  03/02/22 Left 03/02/22 Right 04/06/22 Left 04/06/22  Hip flexion 3- 3- 3/5 3- 3 3  Hip extension   2+ 2+ 3- 3+  Hip abduction   3- 3- 3- 3-  Hip adduction        Hip internal rotation        Hip external rotation        Knee flexion   3- 3- 3 3+  Knee extension 4 4 4/5 4/5 4+ 4+  Ankle dorsiflexion 3+ 4 2+ 2+ 3- 3-  Ankle plantarflexion        Ankle inversion        Ankle eversion         (Blank rows = not tested)  FUNCTIONAL TESTS:  5 times sit to stand: 31.94 sec uses  hands to assist 2 minute walk test: 220 ft   03/02/22: 5 STS 24.85" required hands on lap, WBOS and increased height to complete without HHA. 2MWT[facility] 5STS 19.08 from elevated edge of bed (25 inches), hands on lap, WBOS no UE 2MWT:  315 feet with SPC  Assistive device utilized: Single point cane Level of assistance: SBA Comments: increased lateral trunk movement noted; uses SPC left hand    TODAY'S TREATMENT: 04/15/22 Heel raise 20x Toe raise 20x (unilateral)  Mini squat 20x Standing hip flexion (SLR) GTB 2 x 10 Standing hip abduction GTB 2 x 10 Standing hip extension BTB 2 x 10 Step up lateral  6 inch 2 x 10  Step up forward 6 inch 2X10 STS from standard chair no UE's and black foam riser 2X10   04/08/22 Rec bike 4 min  Heel raise 2 x 10 Toe raise 2 x10 (unilateral)  Mini squat 2 x 10 Standing hip flexion (SLR) GTB 2 x 10 Standing hip abduction GTB 2 x 10 Standing hip extension BTB 2 x 10 Step up 6 inch 2 x 10   04/06/22 Re-evaluation/progress note Functional test measures: 04/06/22: 5STS 19.08 from elevated edge of bed (25 inches), hands on lap, WBOS no UE 2MWT:  315 feet with SPC LEFS:  47/80 (was 46/80 at  evaluation) MMT (see above) Goal review HEP review   03/26/22 Bridge 3 x 10 Sidelying clamshell 3x10 Prone superman, hand at hips 2x10 SLR 2x10   Sitting:  sit to stand no UE assist 2 x 10 bed elevated  PATIENT EDUCATION:  Education details: Patient educated on exam findings, POC, scope of PT, HEP. Person educated: Patient Education method: Explanation, Demonstration, and Handouts Education comprehension: verbalized understanding, returned demonstration, verbal cues required, and tactile cues required    HOME EXERCISE PROGRAM: Access Code: [email or web address] URL: [email or web address] Date: 03/23/2022 Prepared by: LCandie Mile Exercises - Clamshell  - 1 x daily - 7 x weekly - 3 sets - 10 reps - MSpiceron Counter  - 1 x daily - 7 x weekly - 3 sets - 10 reps - Marching with Same-Side Arm Raise  - 1 x daily - 7 x weekly - 3 sets - 10 reps  Access Code: [email or web address] URL: [email or web address]  03/10/2022 - Supine Single Knee to Chest Stretch  - 2 x daily - 7 x weekly - 1 sets - 3 reps - 30" hold - Supine Hamstring Stretch  - 2 x daily - 7 x weekly - 1 sets - 3 reps - 30" hold - Supine Lower Trunk Rotation  - 2 x daily - 7 x weekly - 1 sets - 10 reps - 3-5" hold 02/18/22 - Hip Abduction with Resistance Loop  - 2 x daily - 7 x weekly - 2 sets - 10 reps - Hip Extension with Resistance Loop  - 2 x daily - 7 x weekly - 2 sets - 10 reps - Tandem Stance with Support  - 2 x daily - 7 x weekly - 1 sets - 3 reps - 30 seconds hold   02/11/22 - Supine Bridge  - 2 x daily - 7 x weekly - 2 sets - 10 reps - Small Range Straight Leg Raise  - 2 x daily - 7 x weekly - 2 sets - 10 reps - Sidelying Hip Abduction  - 2 x daily - 7 x weekly - 2 sets - 10 reps - Standing Heel Raise with Support  -  2 x daily - 7 x weekly - 2 sets - 10 reps  Date: 02/02/2022 Prepared by: [facility]  Exercises - Sit to Stand with Armchair  - 2 x daily - 7 x weekly - 1 sets - 10 reps -  Seated March  - 2 x daily - 7 x weekly - 1 sets - 10 reps - Seated Heel Toe Raises  - 2 x daily - 7 x weekly - 1 sets - 10 reps - Seated Long Arc Quad  - 2 x daily - 7 x weekly - 1 sets - 10 reps  ASSESSMENT:  CLINICAL IMPRESSION: Continued with focus on improving LE strength and stability.  Cues to prevent hyperextension with step ups and for maintaining upright posturing.  Pt only required one short seated rest break during session.  Overall improving activity tolerance and strength. Patient will continue to benefit from skilled therapy services to reduce remaining deficits and improve functional ability.   OBJECTIVE IMPAIRMENTS Abnormal gait, decreased activity tolerance, decreased balance, decreased coordination, decreased endurance, decreased knowledge of use of DME, decreased mobility, difficulty walking, decreased ROM, decreased strength, decreased safety awareness, hypomobility, impaired perceived functional ability, impaired flexibility, and pain.   ACTIVITY LIMITATIONS carrying, lifting, bending, sitting, standing, squatting, sleeping, stairs, transfers, bed mobility, bathing, locomotion level, and caring for others  PARTICIPATION LIMITATIONS: meal prep, cleaning, laundry, driving, shopping, and community activity    REHAB POTENTIAL: Good  CLINICAL DECISION MAKING: Stable/uncomplicated  EVALUATION COMPLEXITY: Low   GOALS: Goals reviewed with patient? No  SHORT TERM GOALS: Target date: 02/16/2022   patient will be independent with initial HEP Baseline: 03/02/22: Reports compliance with HEP daily (11/28:  reports completing every other day) Goal status: IN PROGRESS  2.  Patient will improve 5 x STS score from 30.94 sec to 25 sec to demonstrate improved functional mobility and increased lower extremity strength.  Baseline: 03/02/22: 5 STS 24.85" required hands on lap, WBOS and increased height to 26in complete without HHA. Goal status: MET   LONG TERM GOALS: Target date:  05/07/2022   Patient will be independent in self management strategies to improve quality of life and functional outcomes. Baseline:  Goal status: IN PROGRESS  2.  Patient will improve 5 x STS score from 30.94 sec to 20 sec to demonstrate improved functional mobility and increased lower extremity strength. Baseline: 03/02/22: 5 STS 24.85" required hands on lap, WBOS and increased height to complete without HHA. Goal status: MET  3.  Patient will report at least 50% improvement in overall symptoms and/or function to demonstrate improved functional mobility Baseline:  03/02/22: 20-25% improvements Goal status: IN PROGRESS  4.  Patient will increase 2 MWT distance to 300 ft to demonstrate improved functional mobility/ ambulation in community Baseline: 220 ft; 03/02/22: 2MWT 172f with SPC Goal status: MET  5.  Patient will increase hip flexion strength to 4/5 to be able to improve efficiency of swing through phase with ambulation. Baseline: 3- Goal status: IN PROGRESS   PLAN: PT FREQUENCY: 2x/week  PT DURATION: 4 weeks  PLANNED INTERVENTIONS: Therapeutic exercises, Therapeutic activity, Neuromuscular re-education, Balance training, Gait training, Patient/Family education, Joint manipulation, Joint mobilization, Stair training, Orthotic/Fit training, DME instructions, Aquatic Therapy, Dry Needling, Electrical stimulation, Spinal manipulation, Spinal mobilization, Cryotherapy, Moist heat, Compression bandaging, scar mobilization, Splintting, Taping, Traction, Ultrasound, Ionotophoresis 452mml Dexamethasone, and Manual therapy  PLAN FOR NEXT SESSION: continue to progress hip and lower extremity strength. Patient with port right anterior chest wall and new fistula right  upper extremity.   1:38 PM, 04/15/22 Teena Irani, PTA/CLT [place] Ph: [phone]

## 2022-04-19 DIAGNOSIS — D631 Anemia in chronic kidney disease: Secondary | ICD-10-CM | POA: Diagnosis not present

## 2022-04-19 DIAGNOSIS — N186 End stage renal disease: Secondary | ICD-10-CM | POA: Diagnosis not present

## 2022-04-19 DIAGNOSIS — N2581 Secondary hyperparathyroidism of renal origin: Secondary | ICD-10-CM | POA: Diagnosis not present

## 2022-04-19 DIAGNOSIS — E1040 Type 1 diabetes mellitus with diabetic neuropathy, unspecified: Secondary | ICD-10-CM | POA: Diagnosis not present

## 2022-04-19 DIAGNOSIS — Z992 Dependence on renal dialysis: Secondary | ICD-10-CM | POA: Diagnosis not present

## 2022-04-19 DIAGNOSIS — D509 Iron deficiency anemia, unspecified: Secondary | ICD-10-CM | POA: Diagnosis not present

## 2022-04-20 ENCOUNTER — Ambulatory Visit (HOSPITAL_COMMUNITY): Payer: Medicare Other | Admitting: Physical Therapy

## 2022-04-20 ENCOUNTER — Ambulatory Visit (INDEPENDENT_AMBULATORY_CARE_PROVIDER_SITE_OTHER): Payer: Medicare Other | Admitting: Family Medicine

## 2022-04-20 VITALS — BP 138/88 | Ht 67.0 in | Wt 121.4 lb

## 2022-04-20 DIAGNOSIS — I10 Essential (primary) hypertension: Secondary | ICD-10-CM

## 2022-04-20 DIAGNOSIS — R600 Localized edema: Secondary | ICD-10-CM

## 2022-04-20 DIAGNOSIS — E1065 Type 1 diabetes mellitus with hyperglycemia: Secondary | ICD-10-CM

## 2022-04-20 DIAGNOSIS — M7918 Myalgia, other site: Secondary | ICD-10-CM | POA: Diagnosis not present

## 2022-04-20 DIAGNOSIS — R2689 Other abnormalities of gait and mobility: Secondary | ICD-10-CM | POA: Diagnosis not present

## 2022-04-20 DIAGNOSIS — M6281 Muscle weakness (generalized): Secondary | ICD-10-CM | POA: Diagnosis not present

## 2022-04-20 HISTORY — DX: Myalgia, other site: M79.18

## 2022-04-20 MED ORDER — OXYCODONE-ACETAMINOPHEN 5-325 MG PO TABS: 1.0000 | ORAL_TABLET | Freq: Three times a day (TID) | ORAL | 0 refills | Status: DC | PRN

## 2022-04-20 NOTE — Assessment & Plan Note (Signed)
Continues to be uncontrolled.  Advised to increase Basaglar to 15 units.

## 2022-04-20 NOTE — Therapy (Signed)
OUTPATIENT PHYSICAL THERAPY LOWER EXTREMITY TREATMENT     Patient Name: Lindsey Murillo MRN: [id number] DOB:1995-07-28, 26 y.o., female Today's Date: 04/20/2022   END OF SESSION:   PT End of Session - 04/20/22 1038     Visit Number 14    Number of Visits 19    Date for PT Re-Evaluation 05/07/22    Authorization Type [employer]; no auth, 35 visit limit PT/ OT/ ST    Progress Note Due on Visit 21    PT Start Time 1037    PT Stop Time 1115    PT Time Calculation (min) 38 min    Activity Tolerance Patient tolerated treatment well    Behavior During Therapy WFL for tasks assessed/performed               Past Medical History:  Diagnosis Date   Acanthosis nigricans, acquired    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 chronic kidney disease ([place]) 09/25/2021   dialysis M-W-F at Dialysis at [facility] kidney care in [place]   Asthma    Diabetic autonomic neuropathy ([name])    Diabetic peripheral neuropathy ([place])    Environmental allergies    Goiter    History of blood transfusion    Hypertension    no meds currently   Hypoglycemia associated with diabetes ([place])    Tachycardia    Thyroiditis, autoimmune    Type 1 diabetes mellitus in patient age 34-19 years with HbA1C goal below 7.5    Past Surgical History:  Procedure Laterality Date   AV FISTULA PLACEMENT Right 12/08/2021   Procedure: ARTERIOVENOUS  FISTULA CREATION VERSUS GRAFT;  Surgeon: [name], MD;  Location: [facility];  Service: Vascular;  Laterality: Right;   [place] Right 01/12/2022   Procedure: RIGHT ARM SECOND STAGE BASILIC VEIN TRANSPOSITION;  Surgeon: [name], MD;  Location: [facility];  Service: Vascular;  Laterality: Right;   BIOPSY  08/27/2016   Procedure: BIOPSY;  Surgeon: Danie Binder, MD;  Location: [facility];  Service: Endoscopy;;  duodenum; gastric   BIOPSY  06/27/2019   Procedure: BIOPSY;  Surgeon: Daneil Dolin, MD;  Location[facility];  Service: Endoscopy;;   BIOPSY  02/09/2022    Procedure: BIOPSY;  Surgeon: Eloise Harman, DO;  [facility];  Service: Endoscopy;;   BREAST CYST EXCISION Right 11/12/2021   Procedure: RIGHT BREAST ABSCESS INCISION AND DRAINAGE;  Surgeon: Rolm Bookbinder, MD;  Location: [place];  Service: General;  Laterality: Right;  LMA   COLONOSCOPY     COLONOSCOPY WITH PROPOFOL N/A 02/09/2022   Procedure: COLONOSCOPY WITH PROPOFOL;  Surgeon: Eloise Harman, DO;  [facility];  Service: Endoscopy;  Laterality: N/A;  7:30am, asa 3, dialysis pt   ESOPHAGOGASTRODUODENOSCOPY N/A 08/27/2016   Dr. Oneida Alar: mild gastritis. Negative celiac. No obvious source for dyspepsia/diarrhea   ESOPHAGOGASTRODUODENOSCOPY (EGD) WITH PROPOFOL N/A 06/27/2019   rourk: Focal abnormality of the gastric mucosa likely due to trauma (heaving).  Biopsy showed mild gastritis, negative for H. pylori.  Esophageal dilation for history of dysphagia but normal-appearing esophagus.   ESOPHAGOGASTRODUODENOSCOPY (EGD) WITH PROPOFOL N/A 02/09/2022   Procedure: ESOPHAGOGASTRODUODENOSCOPY (EGD) WITH PROPOFOL;  Surgeon: Eloise Harman, DO;  [facility];  Service: Endoscopy;  Laterality: N/A;   IR FLUORO GUIDE CV LINE RIGHT  10/13/2021   IR FLUORO GUIDE CV LINE RIGHT  10/19/2021   IR US GUIDE VASC ACCESS RIGHT  10/13/2021   IRRIGATION AND DEBRIDEMENT ABSCESS N/A  10/04/2021   Procedure: IRRIGATION AND DEBRIDEMENT NECK ABSCESS;  Surgeon: Ralene Ok, MD;  Location: [place];  Service: General;  Laterality: N/A;   Patient Active Problem List   Diagnosis Date Noted   Non-adherence to medical treatment 01/21/2022   Esophageal dysphagia 01/19/2022   ESRD (end stage renal disease) ([place]) 12/18/2021   Abscess of right breast 12/18/2021   Physical deconditioning 10/07/2021   Protein-calorie malnutrition, severe ([place]) 10/02/2021   Depression 09/25/2021   Anemia in ESRD (end-stage renal disease) ([place]) 09/25/2021   Normocytic anemia 06/26/2021   Peripheral edema  06/13/2021   Iron deficiency anemia 06/02/2021   Nephrotic syndrome 05/27/2021   Chronic pancreatitis ([place]) 08/22/2019   Pancreatic pseudocyst/cyst 08/22/2019   GERD (gastroesophageal reflux disease) 08/22/2019   Vitamin D deficiency 03/20/2019   Uncontrolled type 1 diabetes mellitus with hyperglycemia ([place]) 05/12/2015   Essential hypertension, benign 11/30/2012   Diabetic peripheral neuropathy ([place])    Asthma     PCP: Coral Spikes, DO [facility]   REFERRING PROVIDER: Coral Spikes, DO [facility]   REFERRING DIAG: R26.9 (ICD-10-CM) - Gait difficulty   THERAPY DIAG:  No diagnosis found.  Rationale for Evaluation and Treatment Rehabilitation  ONSET DATE: July 2023  SUBJECTIVE:   SUBJECTIVE STATEMENT: Pt reports pain at her Lt upper chest/axillary region without known cause.  7/10 today.  Going to primary today.   Having cataract surgery in the near future (date unknown,) and will keep [place] updated.  Pt is on the renal transplant list and completes diaysis MWF.    PERTINENT HISTORY: Started dialysis July Monday wed and Friday Fistula right side of chest and then will eventually use right arm where new fistula placed  PAIN:  Are you having pain? Yes: NPRS scale: 7/10 Pain location: Lt axillary region Pain description: sore Aggravating factors: unknown Relieving factors: unknown  PRECAUTIONS: Fall  WEIGHT BEARING RESTRICTIONS No  FALLS:  Has patient fallen in last 6 months? Yes. Number of falls 2  LIVING ENVIRONMENT: Lives with: lives with their family Lives in: House/apartment Stairs: Yes: External: 2 steps; none Has following equipment at home: Single point cane, Walker - 2 wheeled, and bed side commode  OCCUPATION: disability  PLOF: Independent with basic ADLs  PATIENT GOALS get my legs stronger   OBJECTIVE:   DIAGNOSTIC FINDINGS: no recent  PATIENT SURVEYS:  LEFS 11/28:  47/80;  evaluation:  46/80  COGNITION:  Overall cognitive status: Within functional limits for tasks assessed     SENSATION: WFL  EDEMA:  Legs much smaller than previous visits    LOWER EXTREMITY MMT:  MMT Right eval Left eval Right  03/02/22 Left 03/02/22 Right 04/06/22 Left 04/06/22  Hip flexion 3- 3- 3/5 3- 3 3  Hip extension   2+ 2+ 3- 3+  Hip abduction   3- 3- 3- 3-  Hip adduction        Hip internal rotation        Hip external rotation        Knee flexion   3- 3- 3 3+  Knee extension 4 4 4/5 4/5 4+ 4+  Ankle dorsiflexion 3+ 4 2+ 2+ 3- 3-  Ankle plantarflexion        Ankle inversion        Ankle eversion         (Blank rows = not tested)  FUNCTIONAL TESTS:  5 times sit to stand: 31.94 sec uses hands to assist 2 minute walk test: 220  ft   03/02/22: 5 STS 24.85" required hands on lap, WBOS and increased height to complete without HHA. [facility] 5STS 19.08 from elevated edge of bed (25 inches), hands on lap, WBOS no UE 2MWT:  315 feet with SPC  Assistive device utilized: Single point cane Level of assistance: SBA Comments: increased lateral trunk movement noted; uses SPC left hand    TODAY'S TREATMENT: 04/20/22 Sit to stands from standard chair no UE and black foam riser 2X10 Heel raise 20x Toe raise 20x (unilateral)  Mini squat 20x Standing hip flexion (SLR) BTB 2 x 10 Standing hip abduction BTB 2 x 10 Standing hip extension BTB 2 x 10 Step up lateral  6 inch 2 x 10  Step up forward 6 inch 2X10    04/15/22 Heel raise 20x Toe raise 20x (unilateral)  Mini squat 20x Standing hip flexion (SLR) GTB 2 x 10 Standing hip abduction GTB 2 x 10 Standing hip extension BTB 2 x 10 Step up lateral  6 inch 2 x 10  Step up forward 6 inch 2X10 STS from standard chair no UE's and black foam riser 2X10   04/08/22 Rec bike 4 min  Heel raise 2 x 10 Toe raise 2 x10 (unilateral)  Mini squat 2 x 10 Standing hip flexion (SLR) GTB 2 x 10 Standing hip abduction  GTB 2 x 10 Standing hip extension BTB 2 x 10 Step up 6 inch 2 x 10   04/06/22 Re-evaluation/progress note Functional test measures: 04/06/22: 5STS 19.08 from elevated edge of bed (25 inches), hands on lap, WBOS no UE 2MWT:  315 feet with SPC LEFS:  47/80 (was 46/80 at evaluation) MMT (see above) Goal review HEP review   03/26/22 Bridge 3 x 10 Sidelying clamshell 3x10 Prone superman, hand at hips 2x10 SLR 2x10   Sitting:  sit to stand no UE assist 2 x 10 bed elevated  PATIENT EDUCATION:  Education details: Patient educated on exam findings, POC, scope of PT, HEP. Person educated: Patient Education method: Explanation, Demonstration, and Handouts Education comprehension: verbalized understanding, returned demonstration, verbal cues required, and tactile cues required    HOME EXERCISE PROGRAM: Access Code: [email or web address] URL: [email or web address] Date: 03/23/2022 Prepared by: LCandie Mile Exercises - Clamshell  - 1 x daily - 7 x weekly - 3 sets - 10 reps - MRonceverteon Counter  - 1 x daily - 7 x weekly - 3 sets - 10 reps - Marching with Same-Side Arm Raise  - 1 x daily - 7 x weekly - 3 sets - 10 reps  Access Code: [email or web address] URL: [email or web address]  03/10/2022 - Supine Single Knee to Chest Stretch  - 2 x daily - 7 x weekly - 1 sets - 3 reps - 30" hold - Supine Hamstring Stretch  - 2 x daily - 7 x weekly - 1 sets - 3 reps - 30" hold - Supine Lower Trunk Rotation  - 2 x daily - 7 x weekly - 1 sets - 10 reps - 3-5" hold 02/18/22 - Hip Abduction with Resistance Loop  - 2 x daily - 7 x weekly - 2 sets - 10 reps - Hip Extension with Resistance Loop  - 2 x daily - 7 x weekly - 2 sets - 10 reps - Tandem Stance with Support  - 2 x daily - 7 x weekly - 1 sets - 3 reps - 30 seconds hold   02/11/22 - Supine Bridge  - 2 x daily -  7 x weekly - 2 sets - 10 reps - Small Range Straight Leg Raise  - 2 x daily - 7 x weekly - 2 sets - 10 reps -  Sidelying Hip Abduction  - 2 x daily - 7 x weekly - 2 sets - 10 reps - Standing Heel Raise with Support  - 2 x daily - 7 x weekly - 2 sets - 10 reps  Date: 02/02/2022 Prepared by: [facility] - [facility]  Exercises - Sit to Stand with Armchair  - 2 x daily - 7 x weekly - 1 sets - 10 reps - Seated March  - 2 x daily - 7 x weekly - 1 sets - 10 reps - Seated Heel Toe Raises  - 2 x daily - 7 x weekly - 1 sets - 10 reps - Seated Long Arc Quad  - 2 x daily - 7 x weekly - 1 sets - 10 reps  ASSESSMENT:  CLINICAL IMPRESSION: Pt running a little late today and with increased pain in her Lt axillary region; plans to discuss with primary MD following therapy session.  Continued with therex wih rest breaks needed due to fatigue.  Able to increase hip flexion and abduction to blue band today. Patient will continue to benefit from skilled therapy services to reduce remaining deficits and improve functional ability.   OBJECTIVE IMPAIRMENTS Abnormal gait, decreased activity tolerance, decreased balance, decreased coordination, decreased endurance, decreased knowledge of use of DME, decreased mobility, difficulty walking, decreased ROM, decreased strength, decreased safety awareness, hypomobility, impaired perceived functional ability, impaired flexibility, and pain.   ACTIVITY LIMITATIONS carrying, lifting, bending, sitting, standing, squatting, sleeping, stairs, transfers, bed mobility, bathing, locomotion level, and caring for others  PARTICIPATION LIMITATIONS: meal prep, cleaning, laundry, driving, shopping, and community activity    REHAB POTENTIAL: Good  CLINICAL DECISION MAKING: Stable/uncomplicated  EVALUATION COMPLEXITY: Low   GOALS: Goals reviewed with patient? No  SHORT TERM GOALS: Target date: 02/16/2022   patient will be independent with initial HEP Baseline: 03/02/22: Reports compliance with HEP daily (11/28:  reports completing every other day) Goal status: IN PROGRESS  2.  Patient will  improve 5 x STS score from 30.94 sec to 25 sec to demonstrate improved functional mobility and increased lower extremity strength.  Baseline: 03/02/22: 5 STS 24.85" required hands on lap, WBOS and increased height to 26in complete without HHA. Goal status: MET   LONG TERM GOALS: Target date: 05/07/2022   Patient will be independent in self management strategies to improve quality of life and functional outcomes. Baseline:  Goal status: IN PROGRESS  2.  Patient will improve 5 x STS score from 30.94 sec to 20 sec to demonstrate improved functional mobility and increased lower extremity strength. Baseline: 03/02/22: 5 STS 24.85" required hands on lap, WBOS and increased height to complete without HHA. Goal status: MET  3.  Patient will report at least 50% improvement in overall symptoms and/or function to demonstrate improved functional mobility Baseline:  03/02/22: 20-25% improvements Goal status: IN PROGRESS  4.  Patient will increase 2 MWT distance to 300 ft to demonstrate improved functional mobility/ ambulation in community Baseline: 220 ft; 03/02/22: 2MWT 129f with SPC Goal status: MET  5.  Patient will increase hip flexion strength to 4/5 to be able to improve efficiency of swing through phase with ambulation. Baseline: 3- Goal status: IN PROGRESS   PLAN: PT FREQUENCY: 2x/week  PT DURATION: 4 weeks  PLANNED INTERVENTIONS: Therapeutic exercises, Therapeutic activity, Neuromuscular re-education, Balance training, Gait  training, Patient/Family education, Joint manipulation, Joint mobilization, Stair training, Orthotic/Fit training, DME instructions, Aquatic Therapy, Dry Needling, Electrical stimulation, Spinal manipulation, Spinal mobilization, Cryotherapy, Moist heat, Compression bandaging, scar mobilization, Splintting, Taping, Traction, Ultrasound, Ionotophoresis 24m/ml Dexamethasone, and Manual therapy  PLAN FOR NEXT SESSION: continue to progress hip and lower extremity  strength. Patient with port right anterior chest wall and new fistula right upper extremity.   12:02 PM, 04/20/22 ATeena Irani PTA/CLT CAndersonPh: [phone]

## 2022-04-20 NOTE — Patient Instructions (Addendum)
Increase insulin to 15 units.  Pain medication as directed for severe pain.  Follow up in 3 months.  Take care  Dr. Lacinda Axon

## 2022-04-20 NOTE — Progress Notes (Signed)
Subjective:  Patient ID: Lindsey Murillo, female    DOB: 17-Mar-1996  Age: 26 y.o. MRN: [id number]  CC: Chief Complaint  Patient presents with   Diabetes    Follow up Left side hurts when she breathes off and on for several weeks Patient would like refill of pain medication    HPI:  26 year old female with an extensive past medical history including diabetes which is uncontrolled and ESRD on hemodialysis presents for follow-up.  Patient recently seen by endocrinology.  Not yet due for A1c.  States that her blood sugar this morning was 145.  She is currently on 14 units of Basaglar nightly.  She is very sensitive to insulin.  No hypoglycemia.  Tolerating dialysis.  This has dramatically improved her edema.  Patient reports a 2-week history of left upper chest wall pain.  Pain has been severe at times.  She states that it feels like "fire".  No recent fall, trauma, injury.  She is working with physical therapy currently.  Appetite is fair.  Patient Active Problem List   Diagnosis Date Noted   Musculoskeletal pain 04/20/2022   Non-adherence to medical treatment 01/21/2022   ESRD (end stage renal disease) ([place]) 12/18/2021   Physical deconditioning 10/07/2021   Protein-calorie malnutrition, severe ([place]) 10/02/2021   Depression 09/25/2021   Anemia in ESRD (end-stage renal disease) ([place]) 09/25/2021   Normocytic anemia 06/26/2021   Peripheral edema 06/13/2021   Iron deficiency anemia 06/02/2021   Nephrotic syndrome 05/27/2021   Chronic pancreatitis ([place]) 08/22/2019   Pancreatic pseudocyst/cyst 08/22/2019   GERD (gastroesophageal reflux disease) 08/22/2019   Uncontrolled type 1 diabetes mellitus with hyperglycemia ([place]) 05/12/2015   Essential hypertension, benign 11/30/2012   Asthma     Social Hx   Social History   Socioeconomic History   Marital status: Single    Spouse name: Not on file   Number of children: 0   Years of education: Not on file   Highest education  level: High school graduate  Occupational History   Occupation: unemployed  Tobacco Use   Smoking status: Never   Smokeless tobacco: Never  Vaping Use   Vaping Use: Never used  Substance and Sexual Activity   Alcohol use: No   Drug use: No   Sexual activity: Not Currently    Birth control/protection: Abstinence, None  Other Topics Concern   Not on file  Social History Narrative   Not on file   Social Determinants of Health   Financial Resource Strain: Not on file  Food Insecurity: No Food Insecurity (07/02/2019)   Hunger Vital Sign    Worried About Running Out of Food in the Last Year: Never true    Ran Out of Food in the Last Year: Never true  Transportation Needs: No Transportation Needs (07/02/2019)   PRAPARE - Hydrologist (Medical): No    Lack of Transportation (Non-Medical): No  Physical Activity: Not on file  Stress: Not on file  Social Connections: Moderately Integrated (07/02/2019)   Social Connection and Isolation Panel [NHANES]    Frequency of Communication with Friends and Family: More than three times a week    Frequency of Social Gatherings with Friends and Family: More than three times a week    Attends Religious Services: 1 to 4 times per year    Active Member of [employer] or Organizations: Yes    Attends Archivist Meetings: 1 to 4 times per year    Marital Status: Never  married    Review of Systems Per HPI  Objective:  BP 138/88   Ht 5\' 7"  (1.702 m)   Wt 121 lb 6.4 oz (55.1 kg)   BMI 19.01 kg/m      04/20/2022    1:18 PM 02/10/2022    9:12 AM 02/09/2022    8:09 AM  BP/Weight  Systolic BP 003 704 888  Diastolic BP 88 916 84  Wt. (Lbs) 121.4 116   BMI 19.01 kg/m2 18.17 kg/m2     Physical Exam Vitals and nursing note reviewed.  Constitutional:      General: She is not in acute distress. HENT:     Head: Normocephalic and atraumatic.  Cardiovascular:     Rate and Rhythm: Normal rate and regular rhythm.   Pulmonary:     Effort: Pulmonary effort is normal.     Breath sounds: Normal breath sounds. No wheezing, rhonchi or rales.  Chest:       Comments: Tenderness at the labeled location. Abdominal:     General: There is no distension.     Palpations: Abdomen is soft.     Tenderness: There is no abdominal tenderness.  Neurological:     Mental Status: She is alert.     Lab Results  Component Value Date   WBC 7.1 12/23/2021   HGB 9.5 (L) 01/12/2022   HCT 28.0 (L) 01/12/2022   PLT 130 (L) 12/23/2021   GLUCOSE 161 (H) 02/09/2022   CHOL 175 09/24/2021   TRIG 250 (H) 09/24/2021   HDL 49 09/24/2021   LDLCALC 85 09/24/2021   ALT 38 12/23/2021   AST 52 (H) 12/23/2021   NA 135 02/09/2022   K 3.9 02/09/2022   CL 97 (L) 02/09/2022   CREATININE 2.96 (H) 02/09/2022   BUN 18 02/09/2022   CO2 28 02/09/2022   TSH 0.975 10/03/2021   INR 1.4 (H) 10/03/2021   HGBA1C 8.3 (A) 01/21/2022   MICROALBUR 1,195.2 (H) 06/13/2021     Assessment & Plan:   Problem List Items Addressed This Visit       Cardiovascular and Mediastinum   Essential hypertension, benign    Blood pressure is well-controlled.  Is currently on metoprolol.        Endocrine   Uncontrolled type 1 diabetes mellitus with hyperglycemia ([place]) - Primary    Continues to be uncontrolled.  Advised to increase Basaglar to 15 units.        Other   Musculoskeletal pain    Short supply of pain medication given.  Continue to work with physical therapy.       Meds ordered this encounter  Medications   oxyCODONE-acetaminophen (PERCOCET) 5-325 MG tablet    Sig: Take 1 tablet by mouth every 8 (eight) hours as needed for severe pain.    Dispense:  15 tablet    Refill:  0    Follow-up:  Return in about 3 months (around 07/20/2022).  [place]

## 2022-04-20 NOTE — Assessment & Plan Note (Signed)
Short supply of pain medication given.  Continue to work with physical therapy.

## 2022-04-20 NOTE — Assessment & Plan Note (Signed)
Blood pressure is well-controlled.  Is currently on metoprolol.

## 2022-04-21 ENCOUNTER — Encounter (HOSPITAL_COMMUNITY): Payer: Self-pay | Admitting: Hematology

## 2022-04-22 ENCOUNTER — Encounter (HOSPITAL_COMMUNITY): Payer: Medicare Other

## 2022-04-26 ENCOUNTER — Encounter: Payer: Self-pay | Admitting: *Deleted

## 2022-04-26 DIAGNOSIS — D631 Anemia in chronic kidney disease: Secondary | ICD-10-CM | POA: Diagnosis not present

## 2022-04-26 DIAGNOSIS — Z992 Dependence on renal dialysis: Secondary | ICD-10-CM | POA: Diagnosis not present

## 2022-04-26 DIAGNOSIS — N186 End stage renal disease: Secondary | ICD-10-CM | POA: Diagnosis not present

## 2022-04-26 DIAGNOSIS — E1040 Type 1 diabetes mellitus with diabetic neuropathy, unspecified: Secondary | ICD-10-CM | POA: Diagnosis not present

## 2022-04-26 DIAGNOSIS — D509 Iron deficiency anemia, unspecified: Secondary | ICD-10-CM | POA: Diagnosis not present

## 2022-04-26 DIAGNOSIS — N2581 Secondary hyperparathyroidism of renal origin: Secondary | ICD-10-CM | POA: Diagnosis not present

## 2022-04-27 ENCOUNTER — Telehealth (HOSPITAL_COMMUNITY): Payer: Self-pay

## 2022-04-27 ENCOUNTER — Encounter (HOSPITAL_COMMUNITY): Payer: Medicare Other | Admitting: Physical Therapy

## 2022-04-27 NOTE — Telephone Encounter (Signed)
Pt called office, is sick. Will cancel today's appt.

## 2022-04-28 DIAGNOSIS — Z992 Dependence on renal dialysis: Secondary | ICD-10-CM | POA: Diagnosis not present

## 2022-04-28 DIAGNOSIS — D509 Iron deficiency anemia, unspecified: Secondary | ICD-10-CM | POA: Diagnosis not present

## 2022-04-28 DIAGNOSIS — E1040 Type 1 diabetes mellitus with diabetic neuropathy, unspecified: Secondary | ICD-10-CM | POA: Diagnosis not present

## 2022-04-28 DIAGNOSIS — D631 Anemia in chronic kidney disease: Secondary | ICD-10-CM | POA: Diagnosis not present

## 2022-04-28 DIAGNOSIS — N2581 Secondary hyperparathyroidism of renal origin: Secondary | ICD-10-CM | POA: Diagnosis not present

## 2022-04-28 DIAGNOSIS — N186 End stage renal disease: Secondary | ICD-10-CM | POA: Diagnosis not present

## 2022-04-29 ENCOUNTER — Ambulatory Visit (HOSPITAL_COMMUNITY): Payer: Medicare Other | Admitting: Physical Therapy

## 2022-04-29 DIAGNOSIS — R600 Localized edema: Secondary | ICD-10-CM | POA: Diagnosis not present

## 2022-04-29 DIAGNOSIS — M6281 Muscle weakness (generalized): Secondary | ICD-10-CM | POA: Diagnosis not present

## 2022-04-29 DIAGNOSIS — R2689 Other abnormalities of gait and mobility: Secondary | ICD-10-CM

## 2022-04-29 NOTE — Therapy (Signed)
OUTPATIENT PHYSICAL THERAPY LOWER EXTREMITY TREATMENT     Patient Name: Lindsey Murillo MRN: [id number] DOB:09/22/1995, 26 y.o., female Today's Date: 04/29/2022   END OF SESSION:   PT End of Session - 04/29/22 1430     Visit Number 15    Number of Visits 19    Date for PT Re-Evaluation 05/07/22    Authorization Type [employer]; no auth, 35 visit limit PT/ OT/ ST    Progress Note Due on Visit 21    PT Start Time 1430    PT Stop Time 1510    PT Time Calculation (min) 40 min    Activity Tolerance Patient tolerated treatment well    Behavior During Therapy WFL for tasks assessed/performed               Past Medical History:  Diagnosis Date   Acanthosis nigricans, acquired    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 chronic kidney disease ([place]) 09/25/2021   dialysis M-W-F at [facility] in [place]   Asthma    Diabetic autonomic neuropathy ([name])    Diabetic peripheral neuropathy ([place])    Environmental allergies    Goiter    History of blood transfusion    Hypertension    no meds currently   Hypoglycemia associated with diabetes ([place])    Tachycardia    Thyroiditis, autoimmune    Type 1 diabetes mellitus in patient age 72-19 years with HbA1C goal below 7.5    Past Surgical History:  Procedure Laterality Date   AV FISTULA PLACEMENT Right 12/08/2021   Procedure: ARTERIOVENOUS  FISTULA CREATION VERSUS GRAFT;  Surgeon: [name], MD;  Location: [facility];  Service: Vascular;  Laterality: Right;   [place] Right 01/12/2022   Procedure: RIGHT ARM SECOND STAGE BASILIC VEIN TRANSPOSITION;  Surgeon: [name], MD;  Location: [facility];  Service: Vascular;  Laterality: Right;   BIOPSY  08/27/2016   Procedure: BIOPSY;  Surgeon: Danie Binder, MD;  Location[facility];  Service: Endoscopy;;  duodenum; gastric   BIOPSY  06/27/2019   Procedure: BIOPSY;  Surgeon: Daneil Dolin, MD;  [facility];  Service: Endoscopy;;   BIOPSY  02/09/2022    Procedure: BIOPSY;  Surgeon: Eloise Harman, DO;  [facility];  Service: Endoscopy;;   BREAST CYST EXCISION Right 11/12/2021   Procedure: RIGHT BREAST ABSCESS INCISION AND DRAINAGE;  Surgeon: Rolm Bookbinder, MD;  Location: [place];  Service: General;  Laterality: Right;  LMA   COLONOSCOPY     COLONOSCOPY WITH PROPOFOL N/A 02/09/2022   Procedure: COLONOSCOPY WITH PROPOFOL;  Surgeon: Eloise Harman, DO;  [facility];  Service: Endoscopy;  Laterality: N/A;  7:30am, asa 3, dialysis pt   ESOPHAGOGASTRODUODENOSCOPY N/A 08/27/2016   Dr. Oneida Alar: mild gastritis. Negative celiac. No obvious source for dyspepsia/diarrhea   ESOPHAGOGASTRODUODENOSCOPY (EGD) WITH PROPOFOL N/A 06/27/2019   rourk: Focal abnormality of the gastric mucosa likely due to trauma (heaving).  Biopsy showed mild gastritis, negative for H. pylori.  Esophageal dilation for history of dysphagia but normal-appearing esophagus.   ESOPHAGOGASTRODUODENOSCOPY (EGD) WITH PROPOFOL N/A 02/09/2022   Procedure: ESOPHAGOGASTRODUODENOSCOPY (EGD) WITH PROPOFOL;  Surgeon: Eloise Harman, DO;  [facility];  Service: Endoscopy;  Laterality: N/A;   IR FLUORO GUIDE CV LINE RIGHT  10/13/2021   IR FLUORO GUIDE CV LINE RIGHT  10/19/2021   IR US GUIDE VASC ACCESS RIGHT  10/13/2021   IRRIGATION AND DEBRIDEMENT ABSCESS N/A  10/04/2021   Procedure: IRRIGATION AND DEBRIDEMENT NECK ABSCESS;  Surgeon: Ralene Ok, MD;  Location: [place];  Service: General;  Laterality: N/A;   Patient Active Problem List   Diagnosis Date Noted   Musculoskeletal pain 04/20/2022   Non-adherence to medical treatment 01/21/2022   ESRD (end stage renal disease) ([place]) 12/18/2021   Physical deconditioning 10/07/2021   Protein-calorie malnutrition, severe ([place]) 10/02/2021   Depression 09/25/2021   Anemia in ESRD (end-stage renal disease) ([place]) 09/25/2021   Normocytic anemia 06/26/2021   Peripheral edema 06/13/2021   Iron deficiency anemia  06/02/2021   Nephrotic syndrome 05/27/2021   Chronic pancreatitis ([place]) 08/22/2019   Pancreatic pseudocyst/cyst 08/22/2019   GERD (gastroesophageal reflux disease) 08/22/2019   Uncontrolled type 1 diabetes mellitus with hyperglycemia ([place]) 05/12/2015   Essential hypertension, benign 11/30/2012   Asthma     PCP: Coral Spikes, DO [place] PROVIDER: Coral Spikes, DO [facility]   REFERRING DIAG: R26.9 (ICD-10-CM) - Gait difficulty   THERAPY DIAG:  Muscle weakness (generalized)  Other abnormalities of gait and mobility  Localized edema  Rationale for Evaluation and Treatment Rehabilitation  ONSET DATE: July 2023  SUBJECTIVE:   SUBJECTIVE STATEMENT: Pt reports she had the stomach bug and couldn't come in last week. Reports pain in Lt axillary region has improved; MD gave some pain pills that help some. 3/10 today.  Having cataract surgery on Lt eye on January 10th.      PERTINENT HISTORY: Started dialysis July Monday wed and Friday, on the renal transplant list Fistula right side of chest and then will eventually use right arm where new fistula placed  PAIN:  Are you having pain? Yes: NPRS scale: 3/10 Pain location: Lt axillary region Pain description: sore Aggravating factors: unknown Relieving factors: unknown  PRECAUTIONS: Fall  WEIGHT BEARING RESTRICTIONS No  FALLS:  Has patient fallen in last 6 months? Yes. Number of falls 2  LIVING ENVIRONMENT: Lives with: lives with their family Lives in: House/apartment Stairs: Yes: External: 2 steps; none Has following equipment at home: Single point cane, Walker - 2 wheeled, and bed side commode  OCCUPATION: disability  PLOF: Independent with basic ADLs  PATIENT GOALS get my legs stronger   OBJECTIVE:   DIAGNOSTIC FINDINGS: no recent  PATIENT SURVEYS:  LEFS 11/28:  47/80;  evaluation: 46/80  COGNITION:  Overall cognitive status: Within functional limits for  tasks assessed     SENSATION: WFL  EDEMA:  Legs much smaller than previous visits    LOWER EXTREMITY MMT:  MMT Right eval Left eval Right  03/02/22 Left 03/02/22 Right 04/06/22 Left 04/06/22  Hip flexion 3- 3- 3/5 3- 3 3  Hip extension   2+ 2+ 3- 3+  Hip abduction   3- 3- 3- 3-  Hip adduction        Hip internal rotation        Hip external rotation        Knee flexion   3- 3- 3 3+  Knee extension 4 4 4/5 4/5 4+ 4+  Ankle dorsiflexion 3+ 4 2+ 2+ 3- 3-  Ankle plantarflexion        Ankle inversion        Ankle eversion         (Blank rows = not tested)  FUNCTIONAL TESTS:  5 times sit to stand: 31.94 sec uses hands to assist 2 minute walk test: 220 ft   03/02/22: 5 STS 24.85" required hands on  lap, WBOS and increased height to complete without HHA. [facility] 5STS 19.08 from elevated edge of bed (25 inches), hands on lap, WBOS no UE 2MWT:  315 feet with SPC  Assistive device utilized: Single point cane Level of assistance: SBA Comments: increased lateral trunk movement noted; uses SPC left hand    TODAY'S TREATMENT:  04/29/22 Sit to stands from standard chair no UE and black foam riser 2X10 Heel raise 20x on incline Toe raise 20x on incline (unilateral)  Mini squat 20x Step up lateral  6 inch 2 x 10  Step up forward 6 inch 2X10 Tandem stance max of 5" without UE assist both LE leading Vectors 10X5" each with 1 HHA   04/20/22 Sit to stands from standard chair no UE and black foam riser 2X10 Heel raise 20x Toe raise 20x (unilateral)  Mini squat 20x Standing hip flexion (SLR) BTB 2 x 10 Standing hip abduction BTB 2 x 10 Standing hip extension BTB 2 x 10 Step up lateral  6 inch 2 x 10  Step up forward 6 inch 2X10  04/15/22 Heel raise 20x Toe raise 20x (unilateral)  Mini squat 20x Standing hip flexion (SLR) GTB 2 x 10 Standing hip abduction GTB 2 x 10 Standing hip extension BTB 2 x 10 Step up lateral  6 inch 2 x 10  Step up  forward 6 inch 2X10 STS from standard chair no UE's and black foam riser 2X10   04/08/22 Rec bike 4 min  Heel raise 2 x 10 Toe raise 2 x10 (unilateral)  Mini squat 2 x 10 Standing hip flexion (SLR) GTB 2 x 10 Standing hip abduction GTB 2 x 10 Standing hip extension BTB 2 x 10 Step up 6 inch 2 x 10   04/06/22 Re-evaluation/progress note Functional test measures: 04/06/22: 5STS 19.08 from elevated edge of bed (25 inches), hands on lap, WBOS no UE 2MWT:  315 feet with SPC LEFS:  47/80 (was 46/80 at evaluation) MMT (see above) Goal review HEP review   03/26/22 Bridge 3 x 10 Sidelying clamshell 3x10 Prone superman, hand at hips 2x10 SLR 2x10   Sitting:  sit to stand no UE assist 2 x 10 bed elevated  PATIENT EDUCATION:  Education details: Patient educated on exam findings, POC, scope of PT, HEP. Person educated: Patient Education method: Explanation, Demonstration, and Handouts Education comprehension: verbalized understanding, returned demonstration, verbal cues required, and tactile cues required    HOME EXERCISE PROGRAM: Access Code: [email or web address] URL: [email or web address] Date: 03/23/2022 Prepared by: LCandie Mile Exercises - Clamshell  - 1 x daily - 7 x weekly - 3 sets - 10 reps - M[place]on Counter  - 1 x daily - 7 x weekly - 3 sets - 10 reps - Marching with Same-Side Arm Raise  - 1 x daily - 7 x weekly - 3 sets - 10 reps  Access Code: [email or web address] URL: [email or web address]  03/10/2022 - Supine Single Knee to Chest Stretch  - 2 x daily - 7 x weekly - 1 sets - 3 reps - 30" hold - Supine Hamstring Stretch  - 2 x daily - 7 x weekly - 1 sets - 3 reps - 30" hold - Supine Lower Trunk Rotation  - 2 x daily - 7 x weekly - 1 sets - 10 reps - 3-5" hold 02/18/22 - Hip Abduction with Resistance Loop  - 2 x daily - 7 x weekly - 2 sets - 10 reps - Hip Extension  with Resistance Loop  - 2 x daily - 7 x weekly - 2 sets - 10 reps - Tandem Stance  with Support  - 2 x daily - 7 x weekly - 1 sets - 3 reps - 30 seconds hold   02/11/22 - Supine Bridge  - 2 x daily - 7 x weekly - 2 sets - 10 reps - Small Range Straight Leg Raise  - 2 x daily - 7 x weekly - 2 sets - 10 reps - Sidelying Hip Abduction  - 2 x daily - 7 x weekly - 2 sets - 10 reps - Standing Heel Raise with Support  - 2 x daily - 7 x weekly - 2 sets - 10 reps  Date: 02/02/2022 Prepared by: [facility]  Exercises - Sit to Stand with Armchair  - 2 x daily - 7 x weekly - 1 sets - 10 reps - Seated March  - 2 x daily - 7 x weekly - 1 sets - 10 reps - Seated Heel Toe Raises  - 2 x daily - 7 x weekly - 1 sets - 10 reps - Seated Long Arc Quad  - 2 x daily - 7 x weekly - 1 sets - 10 reps  ASSESSMENT:  CLINICAL IMPRESSION: Pt returns today following a week of illness but managed to complete her HEP.  Less pain reported today in her Lt axillary region and now has a date for her cataract surgery.  Discussed upcoming reassessment for next week. Continued with therex with no rest breaks needed today.  Focused more on gluteal strength with return of vectors and static balance challenge. Cues to engage gluteal mm more with stability. Patient will continue to benefit from skilled therapy services to reduce remaining deficits and improve functional ability.   OBJECTIVE IMPAIRMENTS Abnormal gait, decreased activity tolerance, decreased balance, decreased coordination, decreased endurance, decreased knowledge of use of DME, decreased mobility, difficulty walking, decreased ROM, decreased strength, decreased safety awareness, hypomobility, impaired perceived functional ability, impaired flexibility, and pain.   ACTIVITY LIMITATIONS carrying, lifting, bending, sitting, standing, squatting, sleeping, stairs, transfers, bed mobility, bathing, locomotion level, and caring for others  PARTICIPATION LIMITATIONS: meal prep, cleaning, laundry, driving, shopping, and community activity    REHAB  POTENTIAL: Good  CLINICAL DECISION MAKING: Stable/uncomplicated  EVALUATION COMPLEXITY: Low   GOALS: Goals reviewed with patient? No  SHORT TERM GOALS: Target date: 02/16/2022   patient will be independent with initial HEP Baseline: 03/02/22: Reports compliance with HEP daily (11/28:  reports completing every other day) Goal status: IN PROGRESS  2.  Patient will improve 5 x STS score from 30.94 sec to 25 sec to demonstrate improved functional mobility and increased lower extremity strength.  Baseline: 03/02/22: 5 STS 24.85" required hands on lap, WBOS and increased height to 26in complete without HHA. Goal status: MET   LONG TERM GOALS: Target date: 05/07/2022   Patient will be independent in self management strategies to improve quality of life and functional outcomes. Baseline:  Goal status: IN PROGRESS  2.  Patient will improve 5 x STS score from 30.94 sec to 20 sec to demonstrate improved functional mobility and increased lower extremity strength. Baseline: 03/02/22: 5 STS 24.85" required hands on lap, WBOS and increased height to complete without HHA. Goal status: MET  3.  Patient will report at least 50% improvement in overall symptoms and/or function to demonstrate improved functional mobility Baseline:  03/02/22: 20-25% improvements Goal status: IN PROGRESS  4.  Patient will  increase 2 MWT distance to 300 ft to demonstrate improved functional mobility/ ambulation in community Baseline: 220 ft; 03/02/22: 2MWT 12f with SPC Goal status: MET  5.  Patient will increase hip flexion strength to 4/5 to be able to improve efficiency of swing through phase with ambulation. Baseline: 3- Goal status: IN PROGRESS   PLAN: PT FREQUENCY: 2x/week  PT DURATION: 4 weeks  PLANNED INTERVENTIONS: Therapeutic exercises, Therapeutic activity, Neuromuscular re-education, Balance training, Gait training, Patient/Family education, Joint manipulation, Joint mobilization, Stair  training, Orthotic/Fit training, DME instructions, Aquatic Therapy, Dry Needling, Electrical stimulation, Spinal manipulation, Spinal mobilization, Cryotherapy, Moist heat, Compression bandaging, scar mobilization, Splintting, Taping, Traction, Ultrasound, Ionotophoresis 439mml Dexamethasone, and Manual therapy  PLAN FOR NEXT SESSION: continue to progress hip and lower extremity strength. Patient with port right anterior chest wall and new fistula right upper extremity.   2:58 PM, 04/29/22 AmTeena IraniPTA/CLT Co[place]h: [phone]

## 2022-05-04 ENCOUNTER — Encounter (HOSPITAL_COMMUNITY): Payer: Self-pay

## 2022-05-04 ENCOUNTER — Ambulatory Visit (HOSPITAL_COMMUNITY): Payer: Medicare Other

## 2022-05-04 DIAGNOSIS — M6281 Muscle weakness (generalized): Secondary | ICD-10-CM | POA: Diagnosis not present

## 2022-05-04 DIAGNOSIS — R600 Localized edema: Secondary | ICD-10-CM

## 2022-05-04 DIAGNOSIS — R2689 Other abnormalities of gait and mobility: Secondary | ICD-10-CM

## 2022-05-04 NOTE — Therapy (Signed)
OUTPATIENT PHYSICAL THERAPY LOWER EXTREMITY TREATMENT     Patient Name: Lindsey Murillo MRN: [id number] DOB:February 11, 1996, 25 y.o., female Today's Date: 05/04/2022   END OF SESSION:   PT End of Session - 05/04/22 1039     Visit Number 16    Number of Visits 19    Date for PT Re-Evaluation 05/07/22    Authorization Type [employer]; no auth, 35 visit limit PT/ OT/ ST    PT Start Time 3545    PT Stop Time 1122    PT Time Calculation (min) 42 min    Activity Tolerance Patient tolerated treatment well    Behavior During Therapy WFL for tasks assessed/performed               Past Medical History:  Diagnosis Date   Acanthosis nigricans, acquired    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 chronic kidney disease ([place]) 09/25/2021   dialysis M-W-F at Dialysis at [facility] in [place]   Asthma    Diabetic autonomic neuropathy ([name])    Diabetic peripheral neuropathy ([place])    Environmental allergies    Goiter    History of blood transfusion    Hypertension    no meds currently   Hypoglycemia associated with diabetes ([place])    Tachycardia    Thyroiditis, autoimmune    Type 1 diabetes mellitus in patient age 75-19 years with HbA1C goal below 7.5    Past Surgical History:  Procedure Laterality Date   AV FISTULA PLACEMENT Right 12/08/2021   Procedure: ARTERIOVENOUS  FISTULA CREATION VERSUS GRAFT;  Surgeon: [name], MD;  Location: [facility];  Service: Vascular;  Laterality: Right;   [place] Right 01/12/2022   Procedure: RIGHT ARM SECOND STAGE BASILIC VEIN TRANSPOSITION;  Surgeon: [name], MD;  Location: [facility];  Service: Vascular;  Laterality: Right;   BIOPSY  08/27/2016   Procedure: BIOPSY;  Surgeon: Danie Binder, MD;  Location: [facility];  Service: Endoscopy;;  duodenum; gastric   BIOPSY  06/27/2019   Procedure: BIOPSY;  Surgeon: Daneil Dolin, MD;  [facility]: [facility];  Service: Endoscopy;;   BIOPSY  02/09/2022   Procedure: BIOPSY;  Surgeon: Eloise Harman, DO;  [facility];  Service: Endoscopy;;   BREAST CYST EXCISION Right 11/12/2021   Procedure: RIGHT BREAST ABSCESS INCISION AND DRAINAGE;  Surgeon: Rolm Bookbinder, MD;  Location: [facility];  Service: General;  Laterality: Right;  LMA   COLONOSCOPY     COLONOSCOPY WITH PROPOFOL N/A 02/09/2022   Procedure: COLONOSCOPY WITH PROPOFOL;  Surgeon: Eloise Harman, DO;  [facility]: [facility];  Service: Endoscopy;  Laterality: N/A;  7:30am, asa 3, dialysis pt   ESOPHAGOGASTRODUODENOSCOPY N/A 08/27/2016   Dr. [facility]: mild gastritis. Negative celiac. No obvious source for dyspepsia/diarrhea   ESOPHAGOGASTRODUODENOSCOPY (EGD) WITH PROPOFOL N/A 06/27/2019   rourk: Focal abnormality of the gastric mucosa likely due to trauma (heaving).  Biopsy showed mild gastritis, negative for H. pylori.  Esophageal dilation for history of dysphagia but normal-appearing esophagus.   ESOPHAGOGASTRODUODENOSCOPY (EGD) WITH PROPOFOL N/A 02/09/2022   Procedure: ESOPHAGOGASTRODUODENOSCOPY (EGD) WITH PROPOFOL;  Surgeon: Eloise Harman, DO;  [facility];  Service: Endoscopy;  Laterality: N/A;   IR FLUORO GUIDE CV LINE RIGHT  10/13/2021   IR FLUORO GUIDE CV LINE RIGHT  10/19/2021   IR US GUIDE VASC ACCESS RIGHT  10/13/2021   IRRIGATION AND DEBRIDEMENT ABSCESS N/A 10/04/2021   Procedure: IRRIGATION AND DEBRIDEMENT NECK ABSCESS;  Surgeon: Ralene Ok, MD;  Location: [facility];  Service: General;  Laterality: N/A;   Patient Active Problem List   Diagnosis Date Noted   Musculoskeletal pain 04/20/2022   Non-adherence to medical treatment 01/21/2022   ESRD (end stage renal disease) ([place]) 12/18/2021   Physical deconditioning 10/07/2021   Protein-calorie malnutrition, severe ([place]) 10/02/2021   Depression 09/25/2021   Anemia in ESRD (end-stage renal disease) ([place]) 09/25/2021   Normocytic anemia 06/26/2021   Peripheral edema 06/13/2021   Iron deficiency anemia 06/02/2021   Nephrotic syndrome 05/27/2021    Chronic pancreatitis ([place]) 08/22/2019   Pancreatic pseudocyst/cyst 08/22/2019   GERD (gastroesophageal reflux disease) 08/22/2019   Uncontrolled type 1 diabetes mellitus with hyperglycemia ([place]) 05/12/2015   Essential hypertension, benign 11/30/2012   Asthma     PCP: Coral Spikes, DO [facility]   REFERRING PROVIDER: Coral Spikes, DO [facility]   REFERRING DIAG: R26.9 (ICD-10-CM) - Gait difficulty   THERAPY DIAG:  Muscle weakness (generalized)  Other abnormalities of gait and mobility  Localized edema  Rationale for Evaluation and Treatment Rehabilitation  ONSET DATE: July 2023  SUBJECTIVE:   SUBJECTIVE STATEMENT: Pt reports she has constant pain Rt hip, pain scale 7/10 today.  Stated she has a lot going on with Lt eye cataract surgery on January 10.    PERTINENT HISTORY: Started dialysis July Monday wed and Friday, on the renal transplant list Fistula right side of chest and then will eventually use right arm where new fistula placed  PAIN:  Are you having pain? Yes: NPRS scale: 7/10 Pain location: Rt hip Pain description: sore Aggravating factors: unknown Relieving factors: unknown  PRECAUTIONS: Fall  WEIGHT BEARING RESTRICTIONS No  FALLS:  Has patient fallen in last 6 months? Yes. Number of falls 2  LIVING ENVIRONMENT: Lives with: lives with their family Lives in: House/apartment Stairs: Yes: External: 2 steps; none Has following equipment at home: Single point cane, Walker - 2 wheeled, and bed side commode  OCCUPATION: disability  PLOF: Independent with basic ADLs  PATIENT GOALS get my legs stronger   OBJECTIVE:   DIAGNOSTIC FINDINGS: no recent  PATIENT SURVEYS:  LEFS 11/28:  47/80;  evaluation: 46/80  COGNITION:  Overall cognitive status: Within functional limits for tasks assessed     SENSATION: WFL  EDEMA:  Legs much smaller than previous visits    LOWER EXTREMITY MMT:  MMT Right eval  Left eval Right  03/02/22 Left 03/02/22 Right 04/06/22 Left 04/06/22  Hip flexion 3- 3- 3/5 3- 3 3  Hip extension   2+ 2+ 3- 3+  Hip abduction   3- 3- 3- 3-  Hip adduction        Hip internal rotation        Hip external rotation        Knee flexion   3- 3- 3 3+  Knee extension 4 4 4/5 4/5 4+ 4+  Ankle dorsiflexion 3+ 4 2+ 2+ 3- 3-  Ankle plantarflexion        Ankle inversion        Ankle eversion         (Blank rows = not tested)  FUNCTIONAL TESTS:  5 times sit to stand: 31.94 sec uses hands to assist 2 minute walk test: 220 ft   03/02/22: 5 STS 24.85" required hands on lap, WBOS and increased height to complete without HHA. 2MWT[facility] 5STS 19.08 from elevated edge of bed (25 inches), hands on lap,  WBOS no UE 2MWT:  315 feet with SPC  Assistive device utilized: Single point cane Level of assistance: SBA Comments: increased lateral trunk movement noted; uses SPC left hand    TODAY'S TREATMENT: 05/04/22 Mini squat 20x Sit to stands from standard chair no UE and black foam riser 2X10 (cueing to reduce WBOS) Bodycraft walkout 2Pl retro, sidestep each direction 5RT each (therapist SBA for safety) Vector stance 5x 5" with 1 HHA   04/29/22 Sit to stands from standard chair no UE and black foam riser 2X10 Heel raise 20x on incline Toe raise 20x on incline (unilateral)  Mini squat 20x Step up lateral  6 inch 2 x 10  Step up forward 6 inch 2X10 Tandem stance max of 5" without UE assist both LE leading Vectors 10X5" each with 1 HHA   04/20/22 Sit to stands from standard chair no UE and black foam riser 2X10 Heel raise 20x Toe raise 20x (unilateral)  Mini squat 20x Standing hip flexion (SLR) BTB 2 x 10 Standing hip abduction BTB 2 x 10 Standing hip extension BTB 2 x 10 Step up lateral  6 inch 2 x 10  Step up forward 6 inch 2X10  04/15/22 Heel raise 20x Toe raise 20x (unilateral)  Mini squat 20x Standing hip flexion (SLR) GTB 2 x 10 Standing  hip abduction GTB 2 x 10 Standing hip extension BTB 2 x 10 Step up lateral  6 inch 2 x 10  Step up forward 6 inch 2X10 STS from standard chair no UE's and black foam riser 2X10   04/08/22 Rec bike 4 min  Heel raise 2 x 10 Toe raise 2 x10 (unilateral)  Mini squat 2 x 10 Standing hip flexion (SLR) GTB 2 x 10 Standing hip abduction GTB 2 x 10 Standing hip extension BTB 2 x 10 Step up 6 inch 2 x 10   04/06/22 Re-evaluation/progress note Functional test measures: 04/06/22: 5STS 19.08 from elevated edge of bed (25 inches), hands on lap, WBOS no UE 2MWT:  315 feet with SPC LEFS:  47/80 (was 46/80 at evaluation) MMT (see above) Goal review HEP review   03/26/22 Bridge 3 x 10 Sidelying clamshell 3x10 Prone superman, hand at hips 2x10 SLR 2x10   Sitting:  sit to stand no UE assist 2 x 10 bed elevated  PATIENT EDUCATION:  Education details: Patient educated on exam findings, POC, scope of PT, HEP. Person educated: Patient Education method: Explanation, Demonstration, and Handouts Education comprehension: verbalized understanding, returned demonstration, verbal cues required, and tactile cues required    HOME EXERCISE PROGRAM: Access Code: [email or web address] URL: [email or web address] Date: 03/23/2022 Prepared by: Candie Mile  Exercises - Clamshell  - 1 x daily - 7 x weekly - 3 sets - 10 reps - Floyd on Counter  - 1 x daily - 7 x weekly - 3 sets - 10 reps - Marching with Same-Side Arm Raise  - 1 x daily - 7 x weekly - 3 sets - 10 reps  Access Code: [email or web address] URL: [email or web address]  03/10/2022 - Supine Single Knee to Chest Stretch  - 2 x daily - 7 x weekly - 1 sets - 3 reps - 30" hold - Supine Hamstring Stretch  - 2 x daily - 7 x weekly - 1 sets - 3 reps - 30" hold - Supine Lower Trunk Rotation  - 2 x daily - 7 x weekly - 1 sets - 10 reps - 3-5" hold 02/18/22 - Hip Abduction with Resistance Loop  -  2 x daily - 7 x weekly - 2 sets - 10  reps - Hip Extension with Resistance Loop  - 2 x daily - 7 x weekly - 2 sets - 10 reps - Tandem Stance with Support  - 2 x daily - 7 x weekly - 1 sets - 3 reps - 30 seconds hold   02/11/22 - Supine Bridge  - 2 x daily - 7 x weekly - 2 sets - 10 reps - Small Range Straight Leg Raise  - 2 x daily - 7 x weekly - 2 sets - 10 reps - Sidelying Hip Abduction  - 2 x daily - 7 x weekly - 2 sets - 10 reps - Standing Heel Raise with Support  - 2 x daily - 7 x weekly - 2 sets - 10 reps  Date: 02/02/2022 Prepared by: [facility]  Exercises - Sit to Stand with Armchair  - 2 x daily - 7 x weekly - 1 sets - 10 reps - Seated March  - 2 x daily - 7 x weekly - 1 sets - 10 reps - Seated Heel Toe Raises  - 2 x daily - 7 x weekly - 1 sets - 10 reps - Seated Long Arc Quad  - 2 x daily - 7 x weekly - 1 sets - 10 reps  ASSESSMENT:  CLINICAL IMPRESSION: Pt continues to demonstrate LE weakness noted by inability to stand from standard height without momentum, elevated height and WBOS.  Session focus with hip strengthening and balance training.  Added bodycraft walkout for gluteal strengthening, balance and control with SBA for safety.  Pt required HHA for proper form with vector stance to reduce lateral lean and cueing for gluteal activation.  No reports of increased pain through session.    OBJECTIVE IMPAIRMENTS Abnormal gait, decreased activity tolerance, decreased balance, decreased coordination, decreased endurance, decreased knowledge of use of DME, decreased mobility, difficulty walking, decreased ROM, decreased strength, decreased safety awareness, hypomobility, impaired perceived functional ability, impaired flexibility, and pain.   ACTIVITY LIMITATIONS carrying, lifting, bending, sitting, standing, squatting, sleeping, stairs, transfers, bed mobility, bathing, locomotion level, and caring for others  PARTICIPATION LIMITATIONS: meal prep, cleaning, laundry, driving, shopping, and community  activity    REHAB POTENTIAL: Good  CLINICAL DECISION MAKING: Stable/uncomplicated  EVALUATION COMPLEXITY: Low   GOALS: Goals reviewed with patient? No  SHORT TERM GOALS: Target date: 02/16/2022   patient will be independent with initial HEP Baseline: 03/02/22: Reports compliance with HEP daily (11/28:  reports completing every other day) Goal status: IN PROGRESS  2.  Patient will improve 5 x STS score from 30.94 sec to 25 sec to demonstrate improved functional mobility and increased lower extremity strength.  Baseline: 03/02/22: 5 STS 24.85" required hands on lap, WBOS and increased height to 26in complete without HHA. Goal status: MET   LONG TERM GOALS: Target date: 05/07/2022   Patient will be independent in self management strategies to improve quality of life and functional outcomes. Baseline:  Goal status: IN PROGRESS  2.  Patient will improve 5 x STS score from 30.94 sec to 20 sec to demonstrate improved functional mobility and increased lower extremity strength. Baseline: 03/02/22: 5 STS 24.85" required hands on lap, WBOS and increased height to complete without HHA. Goal status: MET  3.  Patient will report at least 50% improvement in overall symptoms and/or function to demonstrate improved functional mobility Baseline:  03/02/22: 20-25% improvements Goal status: IN PROGRESS  4.  Patient will increase  2 MWT distance to 300 ft to demonstrate improved functional mobility/ ambulation in community Baseline: 220 ft; 03/02/22: 2MWT 12f with SPC Goal status: MET  5.  Patient will increase hip flexion strength to 4/5 to be able to improve efficiency of swing through phase with ambulation. Baseline: 3- Goal status: IN PROGRESS   PLAN: PT FREQUENCY: 2x/week  PT DURATION: 4 weeks  PLANNED INTERVENTIONS: Therapeutic exercises, Therapeutic activity, Neuromuscular re-education, Balance training, Gait training, Patient/Family education, Joint manipulation, Joint  mobilization, Stair training, Orthotic/Fit training, DME instructions, Aquatic Therapy, Dry Needling, Electrical stimulation, Spinal manipulation, Spinal mobilization, Cryotherapy, Moist heat, Compression bandaging, scar mobilization, Splintting, Taping, Traction, Ultrasound, Ionotophoresis 486mml Dexamethasone, and Manual therapy  PLAN FOR NEXT SESSION: Progress note next session. Continue to progress hip and lower extremity strength. Patient with port right anterior chest wall and new fistula right upper extremity.   CaIhor AustinLPTA/CLT; CBIS [id number] PM, 05/04/22

## 2022-05-05 DIAGNOSIS — Z992 Dependence on renal dialysis: Secondary | ICD-10-CM | POA: Diagnosis not present

## 2022-05-05 DIAGNOSIS — E1040 Type 1 diabetes mellitus with diabetic neuropathy, unspecified: Secondary | ICD-10-CM | POA: Diagnosis not present

## 2022-05-05 DIAGNOSIS — D509 Iron deficiency anemia, unspecified: Secondary | ICD-10-CM | POA: Diagnosis not present

## 2022-05-05 DIAGNOSIS — D631 Anemia in chronic kidney disease: Secondary | ICD-10-CM | POA: Diagnosis not present

## 2022-05-05 DIAGNOSIS — N186 End stage renal disease: Secondary | ICD-10-CM | POA: Diagnosis not present

## 2022-05-05 DIAGNOSIS — N2581 Secondary hyperparathyroidism of renal origin: Secondary | ICD-10-CM | POA: Diagnosis not present

## 2022-05-06 ENCOUNTER — Encounter (HOSPITAL_COMMUNITY): Payer: Medicare Other | Admitting: Physical Therapy

## 2022-05-06 ENCOUNTER — Ambulatory Visit: Payer: 59 | Admitting: "Endocrinology

## 2022-05-07 ENCOUNTER — Encounter (HOSPITAL_COMMUNITY): Payer: Self-pay | Admitting: Hematology

## 2022-05-09 DIAGNOSIS — D631 Anemia in chronic kidney disease: Secondary | ICD-10-CM | POA: Diagnosis not present

## 2022-05-09 DIAGNOSIS — D509 Iron deficiency anemia, unspecified: Secondary | ICD-10-CM | POA: Diagnosis not present

## 2022-05-09 DIAGNOSIS — N2581 Secondary hyperparathyroidism of renal origin: Secondary | ICD-10-CM | POA: Diagnosis not present

## 2022-05-09 DIAGNOSIS — Z992 Dependence on renal dialysis: Secondary | ICD-10-CM | POA: Diagnosis not present

## 2022-05-09 DIAGNOSIS — E1040 Type 1 diabetes mellitus with diabetic neuropathy, unspecified: Secondary | ICD-10-CM | POA: Diagnosis not present

## 2022-05-09 DIAGNOSIS — N186 End stage renal disease: Secondary | ICD-10-CM | POA: Diagnosis not present

## 2022-05-10 ENCOUNTER — Emergency Department (HOSPITAL_COMMUNITY): Payer: Medicare Other

## 2022-05-10 ENCOUNTER — Emergency Department (HOSPITAL_COMMUNITY)
Admission: EM | Admit: 2022-05-10 | Discharge: 2022-05-10 | Disposition: A | Discharge status: Home / Self Care | Payer: Medicare Other | Attending: Emergency Medicine | Admitting: Emergency Medicine

## 2022-05-10 ENCOUNTER — Encounter (HOSPITAL_COMMUNITY): Payer: Self-pay | Admitting: Emergency Medicine

## 2022-05-10 ENCOUNTER — Other Ambulatory Visit: Payer: Self-pay

## 2022-05-10 DIAGNOSIS — A09 Infectious gastroenteritis and colitis, unspecified: Secondary | ICD-10-CM

## 2022-05-10 DIAGNOSIS — E860 Dehydration: Secondary | ICD-10-CM | POA: Diagnosis present

## 2022-05-10 DIAGNOSIS — R10819 Abdominal tenderness, unspecified site: Secondary | ICD-10-CM | POA: Insufficient documentation

## 2022-05-10 LAB — COMPREHENSIVE METABOLIC PANEL WITH GFR
ALT: 152 U/L — ABNORMAL HIGH (ref 0–44)
AST: 119 U/L — ABNORMAL HIGH (ref 15–41)
Albumin: 3.9 g/dL (ref 3.5–5.0)
Alkaline Phosphatase: 429 U/L — ABNORMAL HIGH (ref 38–126)
Anion gap: 13 (ref 5–15)
BUN: 23 mg/dL — ABNORMAL HIGH (ref 6–20)
CO2: 28 mmol/L (ref 22–32)
Calcium: 9.5 mg/dL (ref 8.9–10.3)
Chloride: 94 mmol/L — ABNORMAL LOW (ref 98–111)
Creatinine, Ser: 3.46 mg/dL — ABNORMAL HIGH (ref 0.44–1.00)
GFR, Estimated: 18 mL/min — ABNORMAL LOW
Glucose, Bld: 163 mg/dL — ABNORMAL HIGH (ref 70–99)
Potassium: 4.2 mmol/L (ref 3.5–5.1)
Sodium: 135 mmol/L (ref 135–145)
Total Bilirubin: 1 mg/dL (ref 0.3–1.2)
Total Protein: 8.6 g/dL — ABNORMAL HIGH (ref 6.5–8.1)

## 2022-05-10 LAB — CBC
HCT: 38.6 % (ref 36.0–46.0)
Hemoglobin: 12.3 g/dL (ref 12.0–15.0)
MCH: 27.2 pg (ref 26.0–34.0)
MCHC: 31.9 g/dL (ref 30.0–36.0)
MCV: 85.2 fL (ref 80.0–100.0)
Platelets: 115 10*3/uL — ABNORMAL LOW (ref 150–400)
RBC: 4.53 MIL/uL (ref 3.87–5.11)
RDW: 16.9 % — ABNORMAL HIGH (ref 11.5–15.5)
WBC: 9.6 10*3/uL (ref 4.0–10.5)
nRBC: 0 % (ref 0.0–0.2)

## 2022-05-10 LAB — CBG MONITORING, ED: Glucose-Capillary: 166 mg/dL — ABNORMAL HIGH (ref 70–99)

## 2022-05-10 MED ORDER — CIPROFLOXACIN HCL 500 MG PO TABS: 500.0000 mg | ORAL_TABLET | Freq: Two times a day (BID) | ORAL | 0 refills | Status: DC

## 2022-05-10 MED ORDER — CIPROFLOXACIN HCL 250 MG PO TABS
500.0000 mg | ORAL_TABLET | Freq: Once | ORAL | Status: AC
  Administered 2022-05-10: 500 mg via ORAL
  Filled 2022-05-10: qty 2

## 2022-05-10 MED ORDER — SODIUM CHLORIDE 0.9 % IV BOLUS
250.0000 mL | Freq: Once | INTRAVENOUS | Status: AC
  Administered 2022-05-10: 250 mL via INTRAVENOUS

## 2022-05-10 MED ORDER — ONDANSETRON 4 MG PO TBDP
4.0000 mg | ORAL_TABLET | Freq: Once | ORAL | Status: AC | PRN
  Administered 2022-05-10: 4 mg via ORAL
  Filled 2022-05-10: qty 1

## 2022-05-10 MED ORDER — HYDROCODONE-ACETAMINOPHEN 5-325 MG PO TABS
1.0000 | ORAL_TABLET | Freq: Once | ORAL | Status: AC
  Administered 2022-05-10: 1 via ORAL
  Filled 2022-05-10: qty 1

## 2022-05-10 MED ORDER — ONDANSETRON 4 MG PO TBDP: ORAL_TABLET | ORAL | 0 refills | Status: DC

## 2022-05-10 NOTE — ED Provider Notes (Signed)
[facility] EMERGENCY DEPARTMENT Provider Note   CSN: [id number] Arrival date & time: 05/10/22  1507     History {Add pertinent medical, surgical, social history, OB history to HPI:1} Chief Complaint  Patient presents with   Emesis   Diarrhea    Lindsey Murillo is a 27 y.o. female.  Patient has a history of known PEs and is a dialysis patient she has been having diarrhea and nausea for few days   Emesis Associated symptoms: diarrhea   Diarrhea Associated symptoms: vomiting        Home Medications Prior to Admission medications   Medication Sig Start Date End Date Taking? Authorizing Provider  ciprofloxacin (CIPRO) 500 MG tablet Take 1 tablet (500 mg total) by mouth 2 (two) times daily. One po bid x 7 days 05/10/22  Yes Zammit, Joseph, MD  ondansetron (ZOFRAN-ODT) 4 MG disintegrating tablet 4mg ODT q4 hours prn nausea/vomit 05/10/22  Yes Zammit, Joseph, MD  B Complex-C-Folic Acid (DIALYVITE 800) 0.8 MG TABS Take 1 tablet by mouth daily. 11/16/21   [provider]  blood glucose meter kit and supplies KIT Dispense based on patient and insurance preference. Use up to four times daily as directed. 05/26/21   Cook, Jayce G, DO  Blood Glucose Monitoring Suppl (ACCU-CHEK GUIDE ME) w/Device KIT 1 Piece by Does not apply route as directed. 01/21/22   Nida, Gebreselassie W, MD  Continuous Blood Gluc Receiver (FREESTYLE LIBRE 2 READER) DEVI As directed 02/04/22   Nida, Gebreselassie W, MD  Continuous Blood Gluc Sensor (FREESTYLE LIBRE 2 SENSOR) MISC 1 Piece by Does not apply route every 14 (fourteen) days. 02/04/22   Nida, Gebreselassie W, MD  Cyanocobalamin (B-12 PO) Take 1 capsule by mouth daily.    [provider]  glucose blood (ACCU-CHEK GUIDE) test strip Use to monitor glucose 4 times a day as instructed 01/21/22   Nida, Gebreselassie W, MD  Insulin Glargine (BASAGLAR KWIKPEN) 100 UNIT/ML Inject 14 Units into the skin daily. 02/04/22   Nida, Gebreselassie W, MD  Insulin  Pen Needle 31G X 4 MM MISC Use as directed to administer insulin. 10/22/21   Thompson, Daniel V, MD  Metoprolol Succinate 50 MG CS24 Take 50 mg by mouth at bedtime.    [provider]  oxyCODONE-acetaminophen (PERCOCET) 5-325 MG tablet Take 1 tablet by mouth every 8 (eight) hours as needed for severe pain. 04/20/22   Cook, Jayce G, DO  pantoprazole (PROTONIX) 40 MG tablet Take 1 tablet (40 mg total) by mouth daily before breakfast. 01/19/22   Lewis, Leslie S, PA-C  rosuvastatin (CRESTOR) 10 MG tablet Take 1 tablet by mouth daily.    [provider]  sucroferric oxyhydroxide (VELPHORO) 500 MG chewable tablet Chew 500 mg by mouth 3 (three) times daily with meals. 11/16/21   [provider]      Allergies    Patient has no known allergies.    Review of Systems   Review of Systems  Gastrointestinal:  Positive for diarrhea and vomiting.    Physical Exam Updated Vital Signs BP (!) 147/98 (BP Location: Left Arm)   Pulse (!) 115   Temp 98.4 F (36.9 C) (Oral)   Resp 18   LMP  (LMP Unknown)   SpO2 100%  Physical Exam  ED Results / Procedures / Treatments   Labs (all labs ordered are listed, but only abnormal results are displayed) Labs Reviewed  COMPREHENSIVE METABOLIC PANEL - Abnormal; Notable for the following components:        Result Value   Chloride 94 (*)    Glucose, Bld 163 (*)    BUN 23 (*)    Creatinine, Ser 3.46 (*)    Total Protein 8.6 (*)    AST 119 (*)    ALT 152 (*)    Alkaline Phosphatase 429 (*)    GFR, Estimated 18 (*)    All other components within normal limits  CBC - Abnormal; Notable for the following components:   RDW 16.9 (*)    Platelets 115 (*)    All other components within normal limits  CBG MONITORING, ED - Abnormal; Notable for the following components:   Glucose-Capillary 166 (*)    All other components within normal limits  GASTROINTESTINAL PANEL BY PCR, STOOL (REPLACES STOOL CULTURE)    EKG None  Radiology DG ABD  ACUTE 2+V W 1V CHEST  Result Date: 05/10/2022 CLINICAL DATA:  Diarrhea and vomiting. EXAM: DG ABDOMEN ACUTE WITH 1 VIEW CHEST COMPARISON:  10/18/2021 radiograph and prior studies FINDINGS: The cardiomediastinal silhouette is unremarkable. There is no evidence of focal airspace disease, pulmonary edema, suspicious pulmonary nodule/mass, pleural effusion, or pneumothorax. The bowel gas pattern is unremarkable. There is no evidence of bowel obstruction or pneumoperitoneum. No suspicious calcifications are present. No acute bony abnormalities are identified. IMPRESSION: Negative abdominal radiographs.  No acute cardiopulmonary disease. Electronically Signed   By: Margarette Canada M.D.   On: 05/10/2022 17:29    Procedures Procedures  {Document cardiac monitor, telemetry assessment procedure when appropriate:1}  Medications Ordered in ED Medications  ciprofloxacin (CIPRO) tablet 500 mg (has no administration in time range)  ondansetron (ZOFRAN-ODT) disintegrating tablet 4 mg (4 mg Oral Given 05/10/22 1724)  sodium chloride 0.9 % bolus 250 mL (0 mLs Intravenous Stopped 05/10/22 1639)  HYDROcodone-acetaminophen (NORCO/VICODIN) 5-325 MG per tablet 1 tablet (1 tablet Oral Given 05/10/22 1728)    ED Course/ Medical Decision Making/ A&P                           Medical Decision Making Amount and/or Complexity of Data Reviewed Labs: ordered. Radiology: ordered.  Risk Prescription drug management.   Patient with persistent diarrhea.  She has mild dehydration.  She has been hydrated with 250 of saline and will be discharged home with Zofran and Cipro with a stool specimen pending.  Patient will follow-up with GI or family doctor  {Document critical care time when appropriate:1} {Document review of labs and clinical decision tools ie heart score, Chads2Vasc2 etc:1}  {Document your independent review of radiology images, and any outside records:1} {Document your discussion with family members, caretakers, and  with consultants:1} {Document social determinants of health affecting pt's care:1} {Document your decision making why or why not admission, treatments were needed:1} Final Clinical Impression(s) / ED Diagnoses Final diagnoses:  Diarrhea of infectious origin  Dehydration    Rx / DC Orders ED Discharge Orders          Ordered    ciprofloxacin (CIPRO) 500 MG tablet  2 times daily        05/10/22 1838    ondansetron (ZOFRAN-ODT) 4 MG disintegrating tablet        05/10/22 1838

## 2022-05-10 NOTE — Discharge Instructions (Signed)
Drink plenty of fluids.  Make sure you go to your dialysis at the appropriate time.  Follow-up with your family doctor later this week or you can follow-up with the GI doctor you have been referred to.

## 2022-05-10 NOTE — ED Triage Notes (Signed)
N/v/d x 3 days. C/o mid back pain across from vomiting. Mm wt. Pt dialysis-M/W/F. Took full dose yesterday. A/o

## 2022-05-11 ENCOUNTER — Encounter (HOSPITAL_COMMUNITY): Payer: Medicare Other | Admitting: Physical Therapy

## 2022-05-11 LAB — GASTROINTESTINAL PANEL BY PCR, STOOL (REPLACES STOOL CULTURE)

## 2022-05-12 ENCOUNTER — Ambulatory Visit: Payer: Medicare Other | Admitting: Gastroenterology

## 2022-05-13 ENCOUNTER — Encounter (HOSPITAL_COMMUNITY): Payer: Medicare Other | Admitting: Physical Therapy

## 2022-05-13 ENCOUNTER — Telehealth (HOSPITAL_COMMUNITY): Payer: Self-pay | Admitting: Physical Therapy

## 2022-05-13 DIAGNOSIS — N186 End stage renal disease: Secondary | ICD-10-CM | POA: Diagnosis not present

## 2022-05-13 DIAGNOSIS — Z992 Dependence on renal dialysis: Secondary | ICD-10-CM | POA: Diagnosis not present

## 2022-05-13 DIAGNOSIS — N2581 Secondary hyperparathyroidism of renal origin: Secondary | ICD-10-CM | POA: Diagnosis not present

## 2022-05-13 DIAGNOSIS — E1022 Type 1 diabetes mellitus with diabetic chronic kidney disease: Secondary | ICD-10-CM | POA: Diagnosis not present

## 2022-05-13 DIAGNOSIS — D631 Anemia in chronic kidney disease: Secondary | ICD-10-CM | POA: Diagnosis not present

## 2022-05-13 NOTE — Telephone Encounter (Signed)
Pt did not show for appt.  Called and left VM regarding pending discharge.  As discussed at last reassessment she would be discharged at the end of this certification period. Her certification period is now up and she has no further appts scheduled.  Pt has reached max potential for therapy at this point.  Message sent to evaluating therapist to discharge at this time.    Teena Irani, PTA/CLT [place] Ph: [phone]

## 2022-05-21 ENCOUNTER — Encounter (HOSPITAL_COMMUNITY): Payer: Self-pay | Admitting: Hematology

## 2022-05-25 ENCOUNTER — Ambulatory Visit: Payer: Medicare Other | Admitting: Podiatry

## 2022-05-27 ENCOUNTER — Other Ambulatory Visit: Payer: Self-pay

## 2022-05-27 ENCOUNTER — Emergency Department (HOSPITAL_COMMUNITY): Payer: Medicare Other

## 2022-05-27 ENCOUNTER — Encounter (HOSPITAL_COMMUNITY): Payer: Self-pay | Admitting: Emergency Medicine

## 2022-05-27 ENCOUNTER — Emergency Department (HOSPITAL_COMMUNITY)
Admission: EM | Admit: 2022-05-27 | Discharge: 2022-05-27 | Disposition: A | Discharge status: Home / Self Care | Payer: Medicare Other | Attending: Emergency Medicine | Admitting: Emergency Medicine

## 2022-05-27 DIAGNOSIS — R42 Dizziness and giddiness: Secondary | ICD-10-CM | POA: Insufficient documentation

## 2022-05-27 DIAGNOSIS — E871 Hypo-osmolality and hyponatremia: Secondary | ICD-10-CM | POA: Insufficient documentation

## 2022-05-27 DIAGNOSIS — Z1152 Encounter for screening for COVID-19: Secondary | ICD-10-CM | POA: Diagnosis not present

## 2022-05-27 DIAGNOSIS — K861 Other chronic pancreatitis: Secondary | ICD-10-CM | POA: Insufficient documentation

## 2022-05-27 DIAGNOSIS — R7401 Elevation of levels of liver transaminase levels: Secondary | ICD-10-CM | POA: Diagnosis not present

## 2022-05-27 DIAGNOSIS — J45909 Unspecified asthma, uncomplicated: Secondary | ICD-10-CM | POA: Insufficient documentation

## 2022-05-27 DIAGNOSIS — Z992 Dependence on renal dialysis: Secondary | ICD-10-CM | POA: Insufficient documentation

## 2022-05-27 DIAGNOSIS — E878 Other disorders of electrolyte and fluid balance, not elsewhere classified: Secondary | ICD-10-CM | POA: Insufficient documentation

## 2022-05-27 DIAGNOSIS — E1022 Type 1 diabetes mellitus with diabetic chronic kidney disease: Secondary | ICD-10-CM | POA: Insufficient documentation

## 2022-05-27 DIAGNOSIS — R112 Nausea with vomiting, unspecified: Secondary | ICD-10-CM | POA: Insufficient documentation

## 2022-05-27 DIAGNOSIS — I120 Hypertensive chronic kidney disease with stage 5 chronic kidney disease or end stage renal disease: Secondary | ICD-10-CM | POA: Diagnosis not present

## 2022-05-27 DIAGNOSIS — R748 Abnormal levels of other serum enzymes: Secondary | ICD-10-CM | POA: Insufficient documentation

## 2022-05-27 DIAGNOSIS — R609 Edema, unspecified: Secondary | ICD-10-CM | POA: Insufficient documentation

## 2022-05-27 DIAGNOSIS — M549 Dorsalgia, unspecified: Secondary | ICD-10-CM | POA: Diagnosis not present

## 2022-05-27 DIAGNOSIS — I700 Atherosclerosis of aorta: Secondary | ICD-10-CM | POA: Diagnosis not present

## 2022-05-27 DIAGNOSIS — N186 End stage renal disease: Secondary | ICD-10-CM | POA: Diagnosis not present

## 2022-05-27 DIAGNOSIS — Z794 Long term (current) use of insulin: Secondary | ICD-10-CM | POA: Insufficient documentation

## 2022-05-27 DIAGNOSIS — Z79899 Other long term (current) drug therapy: Secondary | ICD-10-CM | POA: Diagnosis not present

## 2022-05-27 LAB — CBC WITH DIFFERENTIAL/PLATELET
Abs Immature Granulocytes: 0.01 10*3/uL (ref 0.00–0.07)
Basophils Absolute: 0.1 10*3/uL (ref 0.0–0.1)
Basophils Relative: 1 %
Eosinophils Absolute: 0.3 10*3/uL (ref 0.0–0.5)
Eosinophils Relative: 4 %
HCT: 35.6 % — ABNORMAL LOW (ref 36.0–46.0)
Hemoglobin: 11.5 g/dL — ABNORMAL LOW (ref 12.0–15.0)
Immature Granulocytes: 0 %
Lymphocytes Relative: 27 %
Lymphs Abs: 1.9 10*3/uL (ref 0.7–4.0)
MCH: 27.4 pg (ref 26.0–34.0)
MCHC: 32.3 g/dL (ref 30.0–36.0)
MCV: 84.8 fL (ref 80.0–100.0)
Monocytes Absolute: 0.4 10*3/uL (ref 0.1–1.0)
Monocytes Relative: 6 %
Neutro Abs: 4.5 10*3/uL (ref 1.7–7.7)
Neutrophils Relative %: 62 %
Platelets: 143 10*3/uL — ABNORMAL LOW (ref 150–400)
RBC: 4.2 MIL/uL (ref 3.87–5.11)
RDW: 17.9 % — ABNORMAL HIGH (ref 11.5–15.5)
WBC: 7.2 10*3/uL (ref 4.0–10.5)
nRBC: 0 % (ref 0.0–0.2)

## 2022-05-27 LAB — MAGNESIUM: Magnesium: 2.5 mg/dL — ABNORMAL HIGH (ref 1.7–2.4)

## 2022-05-27 LAB — COMPREHENSIVE METABOLIC PANEL WITH GFR
ALT: 129 U/L — ABNORMAL HIGH (ref 0–44)
AST: 81 U/L — ABNORMAL HIGH (ref 15–41)
Albumin: 3.6 g/dL (ref 3.5–5.0)
Alkaline Phosphatase: 335 U/L — ABNORMAL HIGH (ref 38–126)
Anion gap: 15 (ref 5–15)
BUN: 42 mg/dL — ABNORMAL HIGH (ref 6–20)
CO2: 22 mmol/L (ref 22–32)
Calcium: 8.6 mg/dL — ABNORMAL LOW (ref 8.9–10.3)
Chloride: 91 mmol/L — ABNORMAL LOW (ref 98–111)
Creatinine, Ser: 5.88 mg/dL — ABNORMAL HIGH (ref 0.44–1.00)
GFR, Estimated: 9 mL/min — ABNORMAL LOW
Glucose, Bld: 54 mg/dL — ABNORMAL LOW (ref 70–99)
Potassium: 4.2 mmol/L (ref 3.5–5.1)
Sodium: 128 mmol/L — ABNORMAL LOW (ref 135–145)
Total Bilirubin: 0.8 mg/dL (ref 0.3–1.2)
Total Protein: 7.7 g/dL (ref 6.5–8.1)

## 2022-05-27 LAB — CBG MONITORING, ED
Glucose-Capillary: 55 mg/dL — ABNORMAL LOW (ref 70–99)
Glucose-Capillary: 109 mg/dL — ABNORMAL HIGH (ref 70–99)

## 2022-05-27 LAB — RESP PANEL BY RT-PCR (RSV, FLU A&B, COVID)  RVPGX2
Influenza A by PCR: NEGATIVE
Influenza B by PCR: NEGATIVE
Resp Syncytial Virus by PCR: NEGATIVE
SARS Coronavirus 2 by RT PCR: NEGATIVE

## 2022-05-27 LAB — HCG, SERUM, QUALITATIVE: Preg, Serum: NEGATIVE

## 2022-05-27 LAB — LIPASE, BLOOD: Lipase: 51 U/L (ref 11–51)

## 2022-05-27 MED ORDER — DEXTROSE 50 % IV SOLN
INTRAVENOUS | Status: AC
  Administered 2022-05-27: 25 g via INTRAVENOUS
  Filled 2022-05-27: qty 50

## 2022-05-27 MED ORDER — ONDANSETRON HCL 4 MG/2ML IJ SOLN
4.0000 mg | Freq: Once | INTRAMUSCULAR | Status: AC
  Administered 2022-05-27: 4 mg via INTRAVENOUS
  Filled 2022-05-27: qty 2

## 2022-05-27 MED ORDER — SODIUM CHLORIDE 0.9 % IV BOLUS
1000.0000 mL | Freq: Once | INTRAVENOUS | Status: AC
  Administered 2022-05-27: 1000 mL via INTRAVENOUS

## 2022-05-27 MED ORDER — SODIUM CHLORIDE 0.9 % IV BOLUS
500.0000 mL | Freq: Once | INTRAVENOUS | Status: AC
  Administered 2022-05-27: 500 mL via INTRAVENOUS

## 2022-05-27 MED ORDER — DEXTROSE 50 % IV SOLN: 25.0000 g | Freq: Once | INTRAVENOUS | Status: AC

## 2022-05-27 NOTE — ED Provider Notes (Signed)
[facility] EMERGENCY DEPARTMENT Provider Note   CSN: [id number] Arrival date & time: 05/27/22  1607     History  Chief Complaint  Patient presents with   Back Pain   Nausea    Lindsey Murillo is a 27 y.o. female.  With a history of ESRD requiring dialysis on Monday, Wednesday and Friday, asthma, hypertension, uncontrolled type 1 diabetes who presents to the ED for evaluation of nausea, vomiting and back pain.  Symptoms began yesterday.  She missed her dialysis session due to dizziness.  She was then scheduled for dialysis today, but canceled again due to no improvement in her status.  She states she vomited some yesterday, but believes she does not have anything else to vomit up so she has been dry heaving today.  This has caused some chest tightness and some back pain which she rates at a 7 out of 10 and constant.  Back pain is bilateral.  States she has had 2 loose bowel movements today.  She typically takes insulin in the evenings.  She did not take it yesterday due to lack of p.o. intake.  She was found to have a of blood sugar of 55 in triage and was given an amp of D50.  She typically only urinates twice per day, once in the morning after she wakes up and once in the evening before going to bed.  She denies dysuria, frequency or urgency, abdominal pain, fevers, chills, shortness of breath.   Back Pain      Home Medications Prior to Admission medications   Medication Sig Start Date End Date Taking? Authorizing Provider  B Complex-C-Folic Acid (DIALYVITE 062) 0.8 MG TABS Take 1 tablet by mouth daily. 11/16/21   [provider]  blood glucose meter kit and supplies KIT Dispense based on patient and insurance preference. Use up to four times daily as directed. 05/26/21   Coral Spikes, DO  Blood Glucose Monitoring Suppl (ACCU-CHEK GUIDE ME) w/Device KIT 1 Piece by Does not apply route as directed. 01/21/22   Cassandria Anger, MD  ciprofloxacin (CIPRO) 500 MG tablet  Take 1 tablet (500 mg total) by mouth 2 (two) times daily. One po bid x 7 days 05/10/22   Milton Ferguson, MD  Continuous Blood Gluc Receiver (FREESTYLE LIBRE 2 READER) DEVI As directed 02/04/22   Cassandria Anger, MD  Continuous Blood Gluc Sensor (FREESTYLE LIBRE 2 SENSOR) MISC 1 Piece by Does not apply route every 14 (fourteen) days. 02/04/22   Cassandria Anger, MD  Cyanocobalamin (B-12 PO) Take 1 capsule by mouth daily.    [provider]  glucose blood (ACCU-CHEK GUIDE) test strip Use to monitor glucose 4 times a day as instructed 01/21/22   Cassandria Anger, MD  Insulin Glargine (BASAGLAR KWIKPEN) 100 UNIT/ML Inject 14 Units into the skin daily. 02/04/22   Cassandria Anger, MD  Insulin Pen Needle 31G X 4 MM MISC Use as directed to administer insulin. 10/22/21   Eugenie Filler, MD  Metoprolol Succinate 50 MG CS24 Take 50 mg by mouth at bedtime.    [provider]  ondansetron (ZOFRAN-ODT) 4 MG disintegrating tablet 4mg  ODT q4 hours prn nausea/vomit 05/10/22   Milton Ferguson, MD  oxyCODONE-acetaminophen (PERCOCET) 5-325 MG tablet Take 1 tablet by mouth every 8 (eight) hours as needed for severe pain. 04/20/22   Coral Spikes, DO  pantoprazole (PROTONIX) 40 MG tablet Take 1 tablet (40 mg total) by mouth daily before breakfast. 01/19/22  Mahala Menghini, PA-C  rosuvastatin (CRESTOR) 10 MG tablet Take 1 tablet by mouth daily.    [provider]  sucroferric oxyhydroxide (VELPHORO) 500 MG chewable tablet Chew 500 mg by mouth 3 (three) times daily with meals. 11/16/21   [provider]      Allergies    Patient has no known allergies.    Review of Systems   Review of Systems  Gastrointestinal:  Positive for nausea and vomiting.  Musculoskeletal:  Positive for back pain.  All other systems reviewed and are negative.   Physical Exam Updated Vital Signs BP (!) 165/90 (BP Location: Left Arm)   Pulse 97   Temp 98.5 F (36.9 C) (Oral)   Resp 20    Ht 5\' 7"  (1.702 m)   Wt 56 kg   LMP  (LMP Unknown)   SpO2 99%   BMI 19.34 kg/m  Physical Exam Vitals and nursing note reviewed.  Constitutional:      General: She is not in acute distress.    Appearance: She is well-developed.     Comments: Wearing sunglasses indoors(had cataract surgery last week), mild distress  HENT:     Head: Normocephalic and atraumatic.  Eyes:     Conjunctiva/sclera: Conjunctivae normal.     Comments: Left sclera injected, had cataract surgery last week  Cardiovascular:     Rate and Rhythm: Normal rate and regular rhythm.  Pulmonary:     Effort: Pulmonary effort is normal. No respiratory distress.     Breath sounds: Normal breath sounds. No stridor. No wheezing, rhonchi or rales.  Abdominal:     Palpations: Abdomen is soft.     Tenderness: There is no abdominal tenderness. There is no right CVA tenderness, left CVA tenderness, guarding or rebound. Negative signs include Murphy's sign, Rovsing's sign, McBurney's sign, psoas sign and obturator sign.  Musculoskeletal:        General: No swelling.     Cervical back: Neck supple.     Right lower leg: No edema.     Left lower leg: No edema.  Skin:    General: Skin is warm and dry.     Capillary Refill: Capillary refill takes less than 2 seconds.  Neurological:     General: No focal deficit present.     Mental Status: She is alert and oriented to person, place, and time.  Psychiatric:        Mood and Affect: Mood normal.     ED Results / Procedures / Treatments   Labs (all labs ordered are listed, but only abnormal results are displayed) Labs Reviewed  CBC WITH DIFFERENTIAL/PLATELET - Abnormal; Notable for the following components:      Result Value   Hemoglobin 11.5 (*)    HCT 35.6 (*)    RDW 17.9 (*)    Platelets 143 (*)    All other components within normal limits  COMPREHENSIVE METABOLIC PANEL - Abnormal; Notable for the following components:   Sodium 128 (*)    Chloride 91 (*)    Glucose,  Bld 54 (*)    BUN 42 (*)    Creatinine, Ser 5.88 (*)    Calcium 8.6 (*)    AST 81 (*)    ALT 129 (*)    Alkaline Phosphatase 335 (*)    GFR, Estimated 9 (*)    All other components within normal limits  MAGNESIUM - Abnormal; Notable for the following components:   Magnesium 2.5 (*)    All other  components within normal limits  CBG MONITORING, ED - Abnormal; Notable for the following components:   Glucose-Capillary 55 (*)    All other components within normal limits  CBG MONITORING, ED - Abnormal; Notable for the following components:   Glucose-Capillary 109 (*)    All other components within normal limits  RESP PANEL BY RT-PCR (RSV, FLU A&B, COVID)  RVPGX2  LIPASE, BLOOD  HCG, SERUM, QUALITATIVE  URINALYSIS, ROUTINE W REFLEX MICROSCOPIC    EKG EKG Interpretation  Date/Time:  Thursday May 27 2022 17:31:53 EST Ventricular Rate:  101 PR Interval:  160 QRS Duration: 119 QT Interval:  393 QTC Calculation: 510 R Axis:   24 Text Interpretation: Sinus tachycardia Left atrial enlargement LAD, consider left anterior fascicular block Low voltage, extremity and precordial leads Confirmed by Garnette Gunner [phone]) on 05/27/2022 6:02:15 PM  Radiology CT ABDOMEN PELVIS WO CONTRAST  Result Date: 05/27/2022 CLINICAL DATA:  Back pain and nausea since yesterday EXAM: CT ABDOMEN AND PELVIS WITHOUT CONTRAST TECHNIQUE: Multidetector CT imaging of the abdomen and pelvis was performed following the standard protocol without IV contrast. Unenhanced CT was performed per clinician order. Lack of IV contrast limits sensitivity and specificity, especially for evaluation of abdominal/pelvic solid viscera. RADIATION DOSE REDUCTION: This exam was performed according to the departmental dose-optimization program which includes automated exposure control, adjustment of the mA and/or kV according to patient size and/or use of iterative reconstruction technique. COMPARISON:  06/07/2019 FINDINGS: Lower  chest: Chronic left lower lobe scarring. No acute pleural or parenchymal lung disease. Hepatobiliary: Unremarkable unenhanced appearance of the liver and gallbladder. Pancreas: Pancreatic atrophy. Calcifications of the pancreas consistent with sequela of chronic calcific pancreatitis. No acute inflammatory change. Spleen: Unremarkable unenhanced appearance. Adrenals/Urinary Tract: No urinary tract calculi or obstructive uropathy. The adrenals and bladder are unremarkable. Stomach/Bowel: Evaluation of the bowel is limited by the lack of intravenous and oral contrast, as well as the lack of intraperitoneal fat. No bowel obstruction or ileus. The appendix is not well visualized. Vascular/Lymphatic: Aortic atherosclerosis. No enlarged abdominal or pelvic lymph nodes. Reproductive: Uterus and bilateral adnexa are unremarkable. Other: Trace ascites. No free intraperitoneal gas. No abdominal wall hernia. Musculoskeletal: Mild diffuse body wall edema. No acute or destructive bony lesions. Reconstructed images demonstrate no additional findings. IMPRESSION: 1. Limited study due to the lack of intravenous and oral contrast. 2. Trace intra-abdominal and intrapelvic ascites. 3. Mild body wall edema. 4.  Aortic Atherosclerosis (ICD10-I70.0). 5. Sequela of chronic calcific pancreatitis. No acute inflammatory change. Electronically Signed   By: Randa Ngo M.D.   On: 05/27/2022 19:03    Procedures Procedures    Medications Ordered in ED Medications  dextrose 50 % solution 25 g (25 g Intravenous Given 05/27/22 1659)  sodium chloride 0.9 % bolus 500 mL (0 mLs Intravenous Stopped 05/27/22 1828)  ondansetron (ZOFRAN) injection 4 mg (4 mg Intravenous Given 05/27/22 1830)  sodium chloride 0.9 % bolus 1,000 mL (1,000 mLs Intravenous New Bag/Given 05/27/22 1830)    ED Course/ Medical Decision Making/ A&P Clinical Course as of 05/27/22 1922  Thu May 27, 2022  1859 On reevaluation, patient states she is back to feeling like  she normally does. Denies nausea or back pain. Will trial PO challenge [AS]    Clinical Course User Index [AS] Tamisha Nordstrom, [place] Folk, PA-C                             Medical Decision Making Amount  and/or Complexity of Data Reviewed Labs: ordered.  This patient presents to the ED for concern of nausea, vomiting and back pain, this involves an extensive number of treatment options, and is a complaint that carries with it a high risk of complications and morbidity.  The emergent differential diagnosis for vomiting includes, but is not limited to ACS/MI, DKA, Ischemic bowel, Meningitis, Sepsis, Acute gastric dilation, Adrenal insufficiency, Appendicitis,  Bowel obstruction/ileus, Carbon monoxide poisoning, Cholecystitis, Electrolyte abnormalities, Elevated ICP, Gastric outlet obstruction, Pancreatitis, Ruptured viscus, Biliary colic, Cannabinoid hyperemesis syndrome, Gastritis, Gastroenteritis, Gastroparesis,  Narcotic withdrawal, Peptic ulcer disease, and UTI   Co morbidities that complicate the patient evaluation   ESRD requiring dialysis on Monday, Wednesday and Friday, asthma, hypertension, uncontrolled type 1 diabetes  My initial workup includes abdominal pain labs, D50, EKG, 500 cc fluids  Additional history obtained from: Nursing notes from this visit.  I ordered, reviewed and interpreted labs which include: CBC, CMP, magnesium, lipase hCG.  Initially hypoglycemic to 54.  D50 was given and this improved to 109.  Anemia of 11.5 which is improved from baseline.  Hyponatremia of 128.  Patient typically runs around 135.  Hypochloremia of 91.  BUN of 42 and creatinine of 5.88.  Likely sequela of missing AST elevated to 81, ALT elevated to 129, alk phos elevated to 355.  These labs are typically elevated in this patient  I ordered imaging studies including CT abdomen pelvis without contrast I independently visualized and interpreted imaging which showed trace abdominal ascites, mild abdominal  wall edema, otherwise unremarkable I agree with the radiologist interpretation  Cardiac Monitoring:  The patient was maintained on a cardiac monitor.  I personally viewed and interpreted the cardiac monitored which showed an underlying rhythm of: NSR  Afebrile, hypertensive but otherwise hemodynamically stable.  27 year old female presenting to the ED for evaluation of nausea, vomiting and back pain.  She is a dialysis patient and she has noticed her dialysis yesterday, was scheduled for today but missed that as well due to not feeling well.  Patient appears chronically ill, but is an otherwise unremarkable physical exam.  She has no abdominal tenderness.  She has no CVA tenderness.  Her lab work was significant for electrolyte derangement of hyponatremia of 128, initially hypoglycemic of 54.  BUN and creatinine elevated, likely secondary to missing dialysis.  LFTs were elevated, this is a chronic issue.  Her hemoglobin is higher than baseline at 11.5.  She was unable to urinate while in the ED.  She states she typically only urinates twice per day.  I have low suspicion is UTI for the cause of her symptoms as she does not have any urinary symptoms.  Hyponatremia was treated with IV normal saline in the ED.  Hyperglycemia was treated with D50 in the ED.  Her nausea was treated with IV Zofran.  She did report that her nausea had resolved in the ED.  Her p.o. challenge was successful.  Patient stated that she would prefer to go home rather than being admitted.  I believe this is appropriate as her symptoms have improved and she is scheduled for dialysis tomorrow.  She was strongly encouraged to follow-up with her primary care provider as soon as possible for reevaluation and redraw of her labs.  She was given strict return precautions.  Stable at discharge.  At this time there does not appear to be any evidence of an acute emergency medical condition and the patient appears stable for discharge with  appropriate outpatient  follow up. Diagnosis was discussed with patient who verbalizes understanding of care plan and is agreeable to discharge. I have discussed return precautions with patient who verbalizes understanding. Patient encouraged to follow-up with their PCP within 1 week. All questions answered.  Patient's case discussed with Dr. Truett Mainland who agrees with plan to discharge with follow-up.   Note: Portions of this report may have been transcribed using voice recognition software. Every effort was made to ensure accuracy; however, inadvertent computerized transcription errors may still be present.         Final Clinical Impression(s) / ED Diagnoses Final diagnoses:  Nausea and vomiting, unspecified vomiting type  Acute bilateral back pain, unspecified back location    Rx / DC Orders ED Discharge Orders     None         Nehemiah Massed 05/27/22 1922    Cristie Hem, MD 05/28/22 1620

## 2022-05-27 NOTE — Discharge Instructions (Addendum)
You have been seen today for your complaint of nausea, vomiting and back pain. Your imaging was overall reassuring. Your discharge medications include your home medications. Home care instructions are as follows:  Continue to eat and drink your normal diet Follow up with: Your primary care provider as soon as possible for reevaluation and to recheck your blood work Please seek immediate medical care if you develop any of the following symptoms: You have pain in your chest, neck, arm, or jaw. You feel extremely weak or you faint. You have persistent vomiting. You have vomit that is bright red or looks like black coffee grounds. You have bloody or black stools (feces) or stools that look like tar. You have a severe headache, a stiff neck, or both. You have severe pain, cramping, or bloating in your abdomen. You have difficulty breathing, or you are breathing very quickly. Your heart is beating very quickly. Your skin feels cold and clammy. You feel confused. You have signs of dehydration, such as: Dark urine, very little urine, or no urine. Cracked lips. Dry mouth. Sunken eyes. Sleepiness. Weakness. At this time there does not appear to be the presence of an emergent medical condition, however there is always the potential for conditions to change. Please read and follow the below instructions.  Do not take your medicine if  develop an itchy rash, swelling in your mouth or lips, or difficulty breathing; call 911 and seek immediate emergency medical attention if this occurs.  You may review your lab tests and imaging results in their entirety on your MyChart account.  Please discuss all results of fully with your primary care provider and other specialist at your follow-up visit.  Note: Portions of this text may have been transcribed using voice recognition software. Every effort was made to ensure accuracy; however, inadvertent computerized transcription errors may still be present.

## 2022-05-27 NOTE — ED Triage Notes (Signed)
Patient c/o back pain and nausea since yesterday.  Patient is a dialysis patient, and missed dialysis yesterday because she was not feeling well.

## 2022-06-01 ENCOUNTER — Encounter (HOSPITAL_COMMUNITY): Payer: Self-pay | Admitting: Hematology

## 2022-06-01 ENCOUNTER — Ambulatory Visit (INDEPENDENT_AMBULATORY_CARE_PROVIDER_SITE_OTHER): Payer: Medicare Other | Admitting: Podiatry

## 2022-06-01 ENCOUNTER — Encounter: Payer: Self-pay | Admitting: Podiatry

## 2022-06-01 DIAGNOSIS — L84 Corns and callosities: Secondary | ICD-10-CM

## 2022-06-01 DIAGNOSIS — E119 Type 2 diabetes mellitus without complications: Secondary | ICD-10-CM

## 2022-06-01 DIAGNOSIS — E1142 Type 2 diabetes mellitus with diabetic polyneuropathy: Secondary | ICD-10-CM

## 2022-06-01 NOTE — Patient Instructions (Signed)
Look for urea 40% cream or ointment and apply to the thickened dry skin / calluses. This can be bought over the counter, at a pharmacy or online such as [employer].  

## 2022-06-02 ENCOUNTER — Telehealth: Payer: Self-pay

## 2022-06-02 NOTE — Telephone Encounter (Signed)
Patient called and stated that she is unable to pick up her [place] insulin from the pharmacy Patient stated she new prescription sent to pharmacy. [facility] in [place]

## 2022-06-02 NOTE — Telephone Encounter (Deleted)
Patient called and stated she can not get her insulin because her dose has been changed 15units

## 2022-06-03 ENCOUNTER — Other Ambulatory Visit: Payer: Self-pay | Admitting: Family Medicine

## 2022-06-03 MED ORDER — BASAGLAR KWIKPEN 100 UNIT/ML ~~LOC~~ SOPN: 14.0000 [IU] | PEN_INJECTOR | Freq: Every day | SUBCUTANEOUS | 2 refills | Status: DC

## 2022-06-04 DIAGNOSIS — E1022 Type 1 diabetes mellitus with diabetic chronic kidney disease: Secondary | ICD-10-CM | POA: Diagnosis not present

## 2022-06-04 DIAGNOSIS — Z992 Dependence on renal dialysis: Secondary | ICD-10-CM | POA: Diagnosis not present

## 2022-06-04 DIAGNOSIS — N2581 Secondary hyperparathyroidism of renal origin: Secondary | ICD-10-CM | POA: Diagnosis not present

## 2022-06-04 DIAGNOSIS — D631 Anemia in chronic kidney disease: Secondary | ICD-10-CM | POA: Diagnosis not present

## 2022-06-04 DIAGNOSIS — N186 End stage renal disease: Secondary | ICD-10-CM | POA: Diagnosis not present

## 2022-06-05 ENCOUNTER — Encounter: Payer: Self-pay | Admitting: Podiatry

## 2022-06-05 NOTE — Progress Notes (Signed)
  Subjective:  Patient ID: Lindsey Murillo, female    DOB: December 18, 1995,  MRN: [id number]  Chief Complaint  Patient presents with   Diabetes    Diabetic foot care, A1C 8.3, ESRD on dialysis     27 y.o. female presents with the above complaint. History confirmed with patient.  She notes numbness and tingling.  A callus is present on the heel.  Objective:  Physical Exam: warm, good capillary refill, no trophic changes or ulcerative lesions, normal DP and PT pulses, and abnormal sensory and loss of protective sensation at the toes.  Lateral heel callus right foot.  Assessment:   1. Encounter for diabetic foot exam ([place])   2. Callus of foot   3. Type 2 diabetes mellitus with polyneuropathy ([place])      Plan:  Patient was evaluated and treated and all questions answered.   Patient educated on diabetes. Discussed proper diabetic foot care and discussed risks and complications of disease. Educated patient in depth on reasons to return to the office immediately should he/she discover anything concerning or new on the feet. All questions answered. Discussed proper shoes as well.      We discussed preventative and palliative care of these lesions including supportive and accommodative shoegear, padding, prefabricated and custom molded accommodative orthoses, use of a pumice stone and lotions/creams daily.  Recommended urea 40% with salicylic acid for the callus   Return in about 1 year (around 06/02/2023) for diabetic foot exam.

## 2022-06-07 DIAGNOSIS — Z992 Dependence on renal dialysis: Secondary | ICD-10-CM | POA: Diagnosis not present

## 2022-06-07 DIAGNOSIS — D631 Anemia in chronic kidney disease: Secondary | ICD-10-CM | POA: Diagnosis not present

## 2022-06-07 DIAGNOSIS — N186 End stage renal disease: Secondary | ICD-10-CM | POA: Diagnosis not present

## 2022-06-07 DIAGNOSIS — E1022 Type 1 diabetes mellitus with diabetic chronic kidney disease: Secondary | ICD-10-CM | POA: Diagnosis not present

## 2022-06-07 DIAGNOSIS — N2581 Secondary hyperparathyroidism of renal origin: Secondary | ICD-10-CM | POA: Diagnosis not present

## 2022-06-09 DIAGNOSIS — Z992 Dependence on renal dialysis: Secondary | ICD-10-CM | POA: Diagnosis not present

## 2022-06-09 DIAGNOSIS — N186 End stage renal disease: Secondary | ICD-10-CM | POA: Diagnosis not present

## 2022-06-09 DIAGNOSIS — E1022 Type 1 diabetes mellitus with diabetic chronic kidney disease: Secondary | ICD-10-CM | POA: Diagnosis not present

## 2022-06-09 DIAGNOSIS — N2581 Secondary hyperparathyroidism of renal origin: Secondary | ICD-10-CM | POA: Diagnosis not present

## 2022-06-09 DIAGNOSIS — D631 Anemia in chronic kidney disease: Secondary | ICD-10-CM | POA: Diagnosis not present

## 2022-06-10 ENCOUNTER — Encounter (HOSPITAL_COMMUNITY): Payer: Self-pay | Admitting: Hematology

## 2022-06-12 ENCOUNTER — Emergency Department (HOSPITAL_COMMUNITY): Payer: Medicare Other

## 2022-06-12 ENCOUNTER — Other Ambulatory Visit: Payer: Self-pay

## 2022-06-12 ENCOUNTER — Emergency Department (HOSPITAL_COMMUNITY)
Admission: EM | Admit: 2022-06-12 | Discharge: 2022-06-12 | Disposition: A | Discharge status: Home / Self Care | Payer: Medicare Other | Attending: Student | Admitting: Student

## 2022-06-12 ENCOUNTER — Encounter (HOSPITAL_COMMUNITY): Payer: Self-pay | Admitting: Emergency Medicine

## 2022-06-12 DIAGNOSIS — I120 Hypertensive chronic kidney disease with stage 5 chronic kidney disease or end stage renal disease: Secondary | ICD-10-CM | POA: Insufficient documentation

## 2022-06-12 DIAGNOSIS — R531 Weakness: Secondary | ICD-10-CM | POA: Insufficient documentation

## 2022-06-12 DIAGNOSIS — Z79899 Other long term (current) drug therapy: Secondary | ICD-10-CM | POA: Diagnosis not present

## 2022-06-12 DIAGNOSIS — R112 Nausea with vomiting, unspecified: Secondary | ICD-10-CM | POA: Insufficient documentation

## 2022-06-12 DIAGNOSIS — R5383 Other fatigue: Secondary | ICD-10-CM | POA: Insufficient documentation

## 2022-06-12 DIAGNOSIS — Z794 Long term (current) use of insulin: Secondary | ICD-10-CM | POA: Diagnosis not present

## 2022-06-12 DIAGNOSIS — E1022 Type 1 diabetes mellitus with diabetic chronic kidney disease: Secondary | ICD-10-CM | POA: Diagnosis not present

## 2022-06-12 DIAGNOSIS — D631 Anemia in chronic kidney disease: Secondary | ICD-10-CM | POA: Insufficient documentation

## 2022-06-12 DIAGNOSIS — J45909 Unspecified asthma, uncomplicated: Secondary | ICD-10-CM | POA: Diagnosis not present

## 2022-06-12 DIAGNOSIS — E1040 Type 1 diabetes mellitus with diabetic neuropathy, unspecified: Secondary | ICD-10-CM | POA: Insufficient documentation

## 2022-06-12 DIAGNOSIS — Z20822 Contact with and (suspected) exposure to covid-19: Secondary | ICD-10-CM | POA: Insufficient documentation

## 2022-06-12 DIAGNOSIS — E1043 Type 1 diabetes mellitus with diabetic autonomic (poly)neuropathy: Secondary | ICD-10-CM | POA: Insufficient documentation

## 2022-06-12 DIAGNOSIS — N186 End stage renal disease: Secondary | ICD-10-CM | POA: Insufficient documentation

## 2022-06-12 DIAGNOSIS — Z992 Dependence on renal dialysis: Secondary | ICD-10-CM | POA: Insufficient documentation

## 2022-06-12 DIAGNOSIS — R1110 Vomiting, unspecified: Secondary | ICD-10-CM

## 2022-06-12 DIAGNOSIS — R5381 Other malaise: Secondary | ICD-10-CM | POA: Insufficient documentation

## 2022-06-12 LAB — BASIC METABOLIC PANEL
Anion gap: 14 (ref 5–15)
BUN: 33 mg/dL — ABNORMAL HIGH (ref 6–20)
CO2: 23 mmol/L (ref 22–32)
Calcium: 8.9 mg/dL (ref 8.9–10.3)
Chloride: 92 mmol/L — ABNORMAL LOW (ref 98–111)
Creatinine, Ser: 5.14 mg/dL — ABNORMAL HIGH (ref 0.44–1.00)
GFR, Estimated: 11 mL/min — ABNORMAL LOW (ref >60)
Glucose, Bld: 225 mg/dL — ABNORMAL HIGH (ref 70–99)
Potassium: 4.7 mmol/L (ref 3.5–5.1)
Sodium: 129 mmol/L — ABNORMAL LOW (ref 135–145)

## 2022-06-12 LAB — TROPONIN I (HIGH SENSITIVITY)
Troponin I (High Sensitivity): 13 ng/L (ref <18)
Troponin I (High Sensitivity): 15 ng/L (ref <18)

## 2022-06-12 LAB — RESP PANEL BY RT-PCR (RSV, FLU A&B, COVID)  RVPGX2
Influenza A by PCR: NEGATIVE
Influenza B by PCR: NEGATIVE
Resp Syncytial Virus by PCR: NEGATIVE
SARS Coronavirus 2 by RT PCR: NEGATIVE

## 2022-06-12 LAB — CBC
HCT: 35.2 % — ABNORMAL LOW (ref 36.0–46.0)
Hemoglobin: 11.7 g/dL — ABNORMAL LOW (ref 12.0–15.0)
MCH: 28 pg (ref 26.0–34.0)
MCHC: 33.2 g/dL (ref 30.0–36.0)
MCV: 84.2 fL (ref 80.0–100.0)
Platelets: 136 10*3/uL — ABNORMAL LOW (ref 150–400)
RBC: 4.18 MIL/uL (ref 3.87–5.11)
RDW: 19.3 % — ABNORMAL HIGH (ref 11.5–15.5)
WBC: 6.4 10*3/uL (ref 4.0–10.5)
nRBC: 0 % (ref 0.0–0.2)

## 2022-06-12 LAB — HEPATIC FUNCTION PANEL
ALT: 79 U/L — ABNORMAL HIGH (ref 0–44)
AST: 38 U/L (ref 15–41)
Albumin: 3.4 g/dL — ABNORMAL LOW (ref 3.5–5.0)
Alkaline Phosphatase: 288 U/L — ABNORMAL HIGH (ref 38–126)
Bilirubin, Direct: 0.1 mg/dL (ref 0.0–0.2)
Indirect Bilirubin: 0.6 mg/dL (ref 0.3–0.9)
Total Bilirubin: 0.7 mg/dL (ref 0.3–1.2)
Total Protein: 7.1 g/dL (ref 6.5–8.1)

## 2022-06-12 MED ORDER — ONDANSETRON HCL 4 MG/2ML IJ SOLN
4.0000 mg | Freq: Once | INTRAMUSCULAR | Status: AC
  Administered 2022-06-12: 4 mg via INTRAVENOUS
  Filled 2022-06-12: qty 2

## 2022-06-12 MED ORDER — ACETAMINOPHEN 500 MG PO TABS
1000.0000 mg | ORAL_TABLET | Freq: Once | ORAL | Status: AC
  Administered 2022-06-12: 1000 mg via ORAL
  Filled 2022-06-12: qty 2

## 2022-06-12 MED ORDER — LIDOCAINE VISCOUS HCL 2 % MT SOLN
15.0000 mL | Freq: Once | OROMUCOSAL | Status: AC
  Administered 2022-06-12: 15 mL via ORAL
  Filled 2022-06-12: qty 15

## 2022-06-12 MED ORDER — LIDOCAINE 5 % EX PTCH: 1.0000 | MEDICATED_PATCH | CUTANEOUS | 0 refills | Status: DC

## 2022-06-12 MED ORDER — MAALOX MAX 400-400-40 MG/5ML PO SUSP: 15.0000 mL | Freq: Four times a day (QID) | ORAL | 0 refills | Status: DC | PRN

## 2022-06-12 MED ORDER — ALUM & MAG HYDROXIDE-SIMETH 200-200-20 MG/5ML PO SUSP
30.0000 mL | Freq: Once | ORAL | Status: AC
  Administered 2022-06-12: 30 mL via ORAL
  Filled 2022-06-12: qty 30

## 2022-06-12 MED ORDER — ONDANSETRON 4 MG PO TBDP: 4.0000 mg | ORAL_TABLET | Freq: Three times a day (TID) | ORAL | 0 refills | Status: DC | PRN

## 2022-06-12 MED ORDER — LACTATED RINGERS IV BOLUS
1000.0000 mL | Freq: Once | INTRAVENOUS | Status: AC
  Administered 2022-06-12: 1000 mL via INTRAVENOUS

## 2022-06-12 MED ORDER — LIDOCAINE 5 % EX PTCH
1.0000 | MEDICATED_PATCH | CUTANEOUS | Status: DC
  Administered 2022-06-12: 1 via TRANSDERMAL
  Filled 2022-06-12: qty 1

## 2022-06-12 NOTE — ED Triage Notes (Signed)
Patient brought in by wheelchair by POV c/o generalized weakness, nausea and pain all over. Patient missed dialysis yesterday due to not feeling well. Last treatment on Wednesday. Right arm restricted. Patient A&0x4.

## 2022-06-13 NOTE — ED Provider Notes (Signed)
[facility] Provider Note  CSN: [id number] Arrival date & time: 06/12/22 1343  Chief Complaint(s) Weakness  HPI IZABEL CHIM is a 27 y.o. female with PMH ESRD on hemodialysis Monday Wednesday Friday, T1DM who presents emergency department for evaluation of nausea, vomiting and generalized weakness.  Patient states that she has had symptoms for approximately 72 hours with associated nausea, vomiting and generalized myalgias with fatigue.  She states she felt so poorly that she missed her dialysis session yesterday.  She denies shortness of breath, headache, fever or other systemic symptoms.   Past Medical History Past Medical History:  Diagnosis Date   Acanthosis nigricans, acquired    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 chronic kidney disease ([place]) 09/25/2021   dialysis M-W-F at Dialysis at [facility] kidney care in [place]   Asthma    Diabetic autonomic neuropathy ([place])    Diabetic peripheral neuropathy ([place])    Environmental allergies    Goiter    History of blood transfusion    Hypertension    no meds currently   Hypoglycemia associated with diabetes ([place])    Tachycardia    Thyroiditis, autoimmune    Type 1 diabetes mellitus in patient age 109-19 years with HbA1C goal below 7.5    Patient Active Problem List   Diagnosis Date Noted   Musculoskeletal pain 04/20/2022   Other fluid overload 01/26/2022   Non-adherence to medical treatment 01/21/2022   ESRD (end stage renal disease) ([place]) 12/18/2021   Hypertensive chronic kidney disease with stage 5 chronic kidney disease or end stage renal disease ([place]) 10/23/2021   Noninfective gastroenteritis and colitis, unspecified 10/23/2021   Renal osteodystrophy 10/23/2021   Acanthosis nigricans 10/21/2021   Allergy, unspecified, initial encounter 10/21/2021   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 10/21/2021   Coagulation defect, unspecified ([place]) 10/21/2021   Dyspnea,  unspecified 10/21/2021   Encounter for immunization 10/21/2021   Nausea 10/21/2021   Other specified nontoxic goiter 10/21/2021   Pain, unspecified 10/21/2021   Pruritus, unspecified 10/21/2021   Secondary hyperparathyroidism of renal origin ([place]) 10/21/2021   Dependence on renal dialysis ([place]) 10/20/2021   Physical deconditioning 10/07/2021   Protein-calorie malnutrition, severe ([place]) 10/02/2021   Depression 09/25/2021   Anemia in ESRD (end-stage renal disease) ([place]) 09/25/2021   Normocytic anemia 06/26/2021   Peripheral edema 06/13/2021   Iron deficiency anemia 06/02/2021   Nephrotic syndrome 05/27/2021   Chronic pancreatitis ([place]) 08/22/2019   Pancreatic pseudocyst/cyst 08/22/2019   GERD (gastroesophageal reflux disease) 08/22/2019   Uncontrolled type 1 diabetes mellitus with hyperglycemia ([place]) 05/12/2015   Essential hypertension, benign 11/30/2012   Asthma    Home Medication(s) Prior to Admission medications   Medication Sig Start Date End Date Taking? Authorizing Provider  alum & mag hydroxide-simeth (MAALOX MAX) 400-400-40 MG/5ML suspension Take 15 mLs by mouth every 6 (six) hours as needed for indigestion. 06/12/22  Yes Danniel Tones, MD  B Complex-C-Folic Acid (DIALYVITE 010) 0.8 MG TABS Take 1 tablet by mouth daily. 11/16/21  Yes [provider]  Cyanocobalamin (B-12 PO) Take 1 capsule by mouth daily.   Yes [provider]  dorzolamide-timolol (COSOPT) 2-0.5 % ophthalmic solution Place 1 drop into the left eye 2 (two) times daily. 05/20/22 05/20/23 Yes [provider]  Insulin Glargine (BASAGLAR KWIKPEN) 100 UNIT/ML Inject 14 Units into the skin daily. Patient taking differently: Inject 14 Units into the skin at bedtime. 06/03/22  Yes Cook, Jayce G, DO  lidocaine (LIDODERM) 5 % Place  1 patch onto the skin daily. Remove & Discard patch within 12 hours or as directed by MD 06/12/22  Yes Sundus Pete, Debe Coder, MD  Methoxy PEG-Epoetin Beta (MIRCERA IJ) Mircera  02/05/22 02/04/23 Yes [provider]  Metoprolol Succinate 50 MG CS24 Take 50 mg by mouth at bedtime.   Yes [provider]  ondansetron (ZOFRAN-ODT) 4 MG disintegrating tablet 4mg  ODT q4 hours prn nausea/vomit Patient taking differently: Take 4 mg by mouth every 4 (four) hours as needed for nausea or vomiting. 4mg  ODT q4 hours prn nausea/vomit 05/10/22  Yes Milton Ferguson, MD  ondansetron (ZOFRAN-ODT) 4 MG disintegrating tablet Take 1 tablet (4 mg total) by mouth every 8 (eight) hours as needed for nausea or vomiting. 06/12/22  Yes Deklan Minar, MD  pantoprazole (PROTONIX) 40 MG tablet Take 1 tablet (40 mg total) by mouth daily before breakfast. 01/19/22  Yes Mahala Menghini, PA-C  prednisoLONE acetate (PRED FORTE) 1 % ophthalmic suspension Place 1 drop into the left eye 4 (four) times daily. 06/01/22  Yes [provider]  rosuvastatin (CRESTOR) 10 MG tablet Take 1 tablet by mouth daily.   Yes [provider]  sucroferric oxyhydroxide (VELPHORO) 500 MG chewable tablet Chew 500 mg by mouth 3 (three) times daily with meals. 11/16/21  Yes [provider]  blood glucose meter kit and supplies KIT Dispense based on patient and insurance preference. Use up to four times daily as directed. 05/26/21   Coral Spikes, DO  Blood Glucose Monitoring Suppl (ACCU-CHEK GUIDE ME) w/Device KIT 1 Piece by Does not apply route as directed. 01/21/22   [place] Anger, MD  Continuous Blood Gluc Receiver (FREESTYLE LIBRE 2 READER) DEVI As directed 02/04/22   [place]ria Anger, MD  Continuous Blood Gluc Sensor (FREESTYLE LIBRE 2 SENSOR) MISC 1 Piece by Does not apply route every 14 (fourteen) days. 02/04/22   [place]ria Anger, MD  glucose blood (ACCU-CHEK GUIDE) test strip Use to monitor glucose 4 times a day as instructed 01/21/22   [place] Anger, MD  Insulin Pen Needle 31G X 4 MM MISC Use as directed to administer insulin. 10/22/21   Eugenie Filler, MD                                                                                                                                     Past Surgical History Past Surgical History:  Procedure Laterality Date   AV FISTULA PLACEMENT Right 12/08/2021   Procedure: ARTERIOVENOUS  FISTULA CREATION VERSUS GRAFT;  Surgeon: [name], MD;  Location: [facility];  Service: Vascular;  Laterality: Right;   [place] Right 01/12/2022   Procedure: RIGHT ARM SECOND STAGE BASILIC VEIN TRANSPOSITION;  Surgeon: [name], MD;  Location: [facility];  Service: Vascular;  Laterality: Right;   BIOPSY  08/27/2016   Procedure: BIOPSY;  Surgeon: Danie Binder, MD;  [facility];  Service: Endoscopy;;  duodenum; gastric   BIOPSY  06/27/2019   Procedure: BIOPSY;  Surgeon: Daneil Dolin, MD;  [facility]: [facility];  Service: Endoscopy;;   BIOPSY  02/09/2022   Procedure: BIOPSY;  Surgeon: Eloise Harman, DO;  [facility];  Service: Endoscopy;;   BREAST CYST EXCISION Right 11/12/2021   Procedure: RIGHT BREAST ABSCESS INCISION AND DRAINAGE;  Surgeon: Rolm Bookbinder, MD;  Location: [facility];  Service: General;  Laterality: Right;  LMA   COLONOSCOPY     COLONOSCOPY WITH PROPOFOL N/A 02/09/2022   Procedure: COLONOSCOPY WITH PROPOFOL;  Surgeon: Eloise Harman, DO;  [facility];  Service: Endoscopy;  Laterality: N/A;  7:30am, asa 3, dialysis pt   ESOPHAGOGASTRODUODENOSCOPY N/A 08/27/2016   Dr. Oneida Alar: mild gastritis. Negative celiac. No obvious source for dyspepsia/diarrhea   ESOPHAGOGASTRODUODENOSCOPY (EGD) WITH PROPOFOL N/A 06/27/2019   rourk: Focal abnormality of the gastric mucosa likely due to trauma (heaving).  Biopsy showed mild gastritis, negative for H. pylori.  Esophageal dilation for history of dysphagia but normal-appearing esophagus.   ESOPHAGOGASTRODUODENOSCOPY (EGD) WITH PROPOFOL N/A 02/09/2022   Procedure: ESOPHAGOGASTRODUODENOSCOPY (EGD) WITH PROPOFOL;  Surgeon: Eloise Harman, DO;  [facility];  Service: Endoscopy;  Laterality: N/A;   IR FLUORO GUIDE CV LINE RIGHT  10/13/2021   IR FLUORO GUIDE CV LINE RIGHT  10/19/2021   IR US GUIDE VASC ACCESS RIGHT  10/13/2021   IRRIGATION AND DEBRIDEMENT ABSCESS N/A 10/04/2021   Procedure: IRRIGATION AND DEBRIDEMENT NECK ABSCESS;  Surgeon: Ralene Ok, MD;  Location: [facility];  Service: General;  Laterality: N/A;   Family History Family History  Problem Relation Age of Onset   Diabetes Mother        Type II DM   Thyroid disease Mother    Diabetes Maternal Grandmother        Type II DM   Diabetes Cousin        Type II DM   Colon cancer Neg Hx    Colon polyps Neg Hx     Social History Social History   Tobacco Use   Smoking status: Never   Smokeless tobacco: Never  Vaping Use   Vaping Use: Never used  Substance Use Topics   Alcohol use: No   Drug use: No   Allergies Patient has no known allergies.  Review of Systems Review of Systems  Gastrointestinal:  Positive for abdominal pain, nausea and vomiting.  Musculoskeletal:  Positive for arthralgias and myalgias.    Physical Exam Vital Signs  I have reviewed the triage vital signs BP (!) 160/98   Pulse 86   Temp 98 F (36.7 C)   Resp 20   Ht 5\' 7"  (1.702 m)   Wt 54.4 kg   SpO2 99%   BMI 18.79 kg/m   Physical Exam Vitals and nursing note reviewed.  Constitutional:      General: She is not in acute distress.    Appearance: She is well-developed.  HENT:     Head: Normocephalic and atraumatic.  Eyes:     Conjunctiva/sclera: Conjunctivae normal.  Cardiovascular:     Rate and Rhythm: Normal rate and regular rhythm.     Heart sounds: No murmur heard. Pulmonary:     Effort: Pulmonary effort is normal. No respiratory distress.     Breath sounds: Normal breath sounds.  Abdominal:     Palpations: Abdomen is soft.     Tenderness: There is no abdominal tenderness.  Musculoskeletal:  General: Tenderness present. No swelling.      Cervical back: Neck supple.  Skin:    General: Skin is warm and dry.     Capillary Refill: Capillary refill takes less than 2 seconds.  Neurological:     Mental Status: She is alert.  Psychiatric:        Mood and Affect: Mood normal.     ED Results and Treatments Labs (all labs ordered are listed, but only abnormal results are displayed) Labs Reviewed  BASIC METABOLIC PANEL - Abnormal; Notable for the following components:      Result Value   Sodium 129 (*)    Chloride 92 (*)    Glucose, Bld 225 (*)    BUN 33 (*)    Creatinine, Ser 5.14 (*)    GFR, Estimated 11 (*)    All other components within normal limits  CBC - Abnormal; Notable for the following components:   Hemoglobin 11.7 (*)    HCT 35.2 (*)    RDW 19.3 (*)    Platelets 136 (*)    All other components within normal limits  HEPATIC FUNCTION PANEL - Abnormal; Notable for the following components:   Albumin 3.4 (*)    ALT 79 (*)    Alkaline Phosphatase 288 (*)    All other components within normal limits  RESP PANEL BY RT-PCR (RSV, FLU A&B, COVID)  RVPGX2  TROPONIN I (HIGH SENSITIVITY)  TROPONIN I (HIGH SENSITIVITY)                                                                                                                          Radiology DG Chest Portable 1 View  Result Date: 06/12/2022 CLINICAL DATA:  Generalized weakness, nausea, pain EXAM: PORTABLE CHEST 1 VIEW COMPARISON:  05/10/2022 FINDINGS: Single frontal view of the chest demonstrates an enlarged cardiac silhouette. No airspace disease, effusion, or pneumothorax. No acute bony abnormality. IMPRESSION: 1. Stable enlarged cardiac silhouette. 2. No acute airspace disease. Electronically Signed   By: Randa Ngo M.D.   On: 06/12/2022 16:41    Pertinent labs & imaging results that were available during my care of the patient were reviewed by me and considered in my medical decision making (see MDM for details).  Medications Ordered in  ED Medications  lactated ringers bolus 1,000 mL (0 mLs Intravenous Stopped 06/12/22 1949)  acetaminophen (TYLENOL) tablet 1,000 mg (1,000 mg Oral Given 06/12/22 1638)  ondansetron (ZOFRAN) injection 4 mg (4 mg Intravenous Given 06/12/22 1639)  alum & mag hydroxide-simeth (MAALOX/MYLANTA) 200-200-20 MG/5ML suspension 30 mL (30 mLs Oral Given 06/12/22 1941)    And  lidocaine (XYLOCAINE) 2 % viscous mouth solution 15 mL (15 mLs Oral Given 06/12/22 1941)  Procedures Procedures  (including critical care time)  Medical Decision Making / ED Course   This patient presents to the ED for concern of nausea, vomiting, fatigue, generalized weakness, this involves an extensive number of treatment options, and is a complaint that carries with it a high risk of complications and morbidity.  The differential diagnosis includes electrolyte abnormality, COVID-19, influenza, RSV, unspecified viral illness, pneumonia, anemia  MDM: Patient seen emerged part for evaluation of multiple complaints described above.  Physical exam with generalized tenderness in the paraspinal L-spine but is otherwise unremarkable.  Laboratory evaluation with a hemoglobin of 11.7 which is patient's baseline, mild hyponatremia 129, hypochloremia 92, BUN 33, creatinine 5.14 consistent with her history of ESRD on hemodialysis.  COVID, flu, RSV negative.  High-sensitivity troponin negative.  Chest x-ray with no evidence of fluid overload or pneumonia.  Patient fluid resuscitated and given Tylenol and Zofran and on reevaluation symptoms have significant improved.  She endorses some mild epigastric and chest discomfort likely secondary to her frequent vomiting and she received a GI cocktail leading to resolution of her symptoms.  I spoke with the nephrologist on-call who agrees the patient likely would be safe for dialysis on  Monday and does not require emergent dialysis today.  Patient discharged with outpatient follow-up.  She was given return precautions of which she and her mother voiced understanding.   Additional history obtained: -Additional history obtained from mother -External records from outside source obtained and reviewed including: Chart review including previous notes, labs, imaging, consultation notes   Lab Tests: -I ordered, reviewed, and interpreted labs.   The pertinent results include:   Labs Reviewed  BASIC METABOLIC PANEL - Abnormal; Notable for the following components:      Result Value   Sodium 129 (*)    Chloride 92 (*)    Glucose, Bld 225 (*)    BUN 33 (*)    Creatinine, Ser 5.14 (*)    GFR, Estimated 11 (*)    All other components within normal limits  CBC - Abnormal; Notable for the following components:   Hemoglobin 11.7 (*)    HCT 35.2 (*)    RDW 19.3 (*)    Platelets 136 (*)    All other components within normal limits  HEPATIC FUNCTION PANEL - Abnormal; Notable for the following components:   Albumin 3.4 (*)    ALT 79 (*)    Alkaline Phosphatase 288 (*)    All other components within normal limits  RESP PANEL BY RT-PCR (RSV, FLU A&B, COVID)  RVPGX2  TROPONIN I (HIGH SENSITIVITY)  TROPONIN I (HIGH SENSITIVITY)      EKG   EKG Interpretation  Date/Time:  Saturday June 12 2022 13:57:51 EST Ventricular Rate:  94 PR Interval:  154 QRS Duration: 114 QT Interval:  396 QTC Calculation: 496 R Axis:   -72 Text Interpretation: Sinus rhythm Left atrial enlargement Incomplete RBBB and LAFB Low voltage, precordial leads Prolonged QT interval Baseline wander in lead(s) V2 No significant change since prior 1/24 Confirmed by Aletta Edouard [phone]) on 06/12/2022 2:09:19 PM         Imaging Studies ordered: I ordered imaging studies including chest x-ray I independently visualized and interpreted imaging. I agree with the radiologist interpretation   Medicines  ordered and prescription drug management: Meds ordered this encounter  Medications   lactated ringers bolus 1,000 mL   acetaminophen (TYLENOL) tablet 1,000 mg   ondansetron (ZOFRAN) injection 4 mg   DISCONTD: lidocaine (LIDODERM) 5 %  1 patch   AND Linked Order Group    alum & mag hydroxide-simeth (MAALOX/MYLANTA) 200-200-20 MG/5ML suspension 30 mL    lidocaine (XYLOCAINE) 2 % viscous mouth solution 15 mL   ondansetron (ZOFRAN-ODT) 4 MG disintegrating tablet    Sig: Take 1 tablet (4 mg total) by mouth every 8 (eight) hours as needed for nausea or vomiting.    Dispense:  20 tablet    Refill:  0   alum & mag hydroxide-simeth (MAALOX MAX) 789-381-01 MG/5ML suspension    Sig: Take 15 mLs by mouth every 6 (six) hours as needed for indigestion.    Dispense:  355 mL    Refill:  0   lidocaine (LIDODERM) 5 %    Sig: Place 1 patch onto the skin daily. Remove & Discard patch within 12 hours or as directed by MD    Dispense:  30 patch    Refill:  0    -I have reviewed the patients home medicines and have made adjustments as needed  Critical interventions none  Consultations Obtained: I requested consultation with the nephrologist on-call,  and discussed lab and imaging findings as well as pertinent plan - they recommend: Dialysis on Monday   Cardiac Monitoring: The patient was maintained on a cardiac monitor.  I personally viewed and interpreted the cardiac monitored which showed an underlying rhythm of: NSR  Social Determinants of Health:  Factors impacting patients care include: none   Reevaluation: After the interventions noted above, I reevaluated the patient and found that they have :improved  Co morbidities that complicate the patient evaluation  Past Medical History:  Diagnosis Date   Acanthosis nigricans, acquired    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 chronic kidney disease ([place]) 09/25/2021   dialysis M-W-F at Dialysis at [facility] kidney care in [place]   Asthma    Diabetic autonomic  neuropathy ([place])    Diabetic peripheral neuropathy ([place])    Environmental allergies    Goiter    History of blood transfusion    Hypertension    no meds currently   Hypoglycemia associated with diabetes ([place])    Tachycardia    Thyroiditis, autoimmune    Type 1 diabetes mellitus in patient age 98-19 years with HbA1C goal below 7.5       Dispostion: I considered admission for this patient, but at this time she does not meet inpatient criteria for admission she is safe for discharge with outpatient follow-up.     Final Clinical Impression(s) / ED Diagnoses Final diagnoses:  Generalized weakness  Vomiting, unspecified vomiting type, unspecified whether nausea present     @PCDICTATION @    Rhonin Trott, Debe Coder, MD 06/13/22 1330

## 2022-06-16 NOTE — Progress Notes (Unsigned)
GI Office Note    Referring Provider: Coral Spikes, DO Primary Care Physician:  Coral Spikes, DO Primary Gastroenterologist: Elon Alas. Abbey Chatters, DO   Date:  06/17/2022  ID:  Lindsey Murillo, DOB 1996/03/14, MRN [id number]   Chief Complaint   Chief Complaint  Patient presents with   Follow-up    Stomach still burn  sometimes and some vomiting.     History of Present Illness  Lindsey Murillo is a 27 y.o. female with a history of chronic pancreatitis with stable pseudocyst, gastritis, end-stage renal disease on dialysis M, W, F, asthma, type 1 diabetes, HTN presenting today for follow-up.  Last office visit 01/19/2022. Per review of note, patient has history of chronic parotitis with PPI and stable pseudocyst.  Anemia first noted in December 2020 with hemoglobin 6.3 at which time she also had folate deficiency and low iron with heme positive stool.  Her ferritin was normal in the setting of osteomyelitis and suspected to have acute kidney injury related to vancomycin.  Noted to have EGD in February 2021 with mild gastritis and biopsies negative for celiac.  She had complaints of some heartburn and solid food dysphagia.  Noted to have CT A/P with contrast in January 2021 showing few scattered calcifications within the pancreas and stable pseudocyst position just.  Pancreatic tail that was unchanged.  Had some mild peripancreatic inflammation.  To further evaluate left-sided abdominal pain and GERD with nausea and dysphagia she was recommended to repeat EGD and also schedule colonoscopy for further evaluation of anemia.  She was also started on pantoprazole 40 mg once daily.  EGD 03/08/2022: -Gastritis s/p biopsy -Normal duodenum -Biopsy negative for H. pylori, slight chronic inflammation. -Use pantoprazole 40 mg once daily -Avoid NSAIDs.  Colonoscopy 02/09/2022: -Nonbleeding internal hemorrhoids -Exam otherwise normal -Repeat colonoscopy at age 17 or sooner  ED visit 05/10/2022  for diarrhea. Presenting with complaint of diarrhea and nausea.  Was given prescription for Zofran and Cipro.  GI pathogen panel negative.  ED visit 05/27/2022 for nausea/vomiting and back pain. EKG with sinus tach and left atrial enlargement.  Low voltage.  Reportedly missed dialysis session due to dizziness.  Also with low blood sugar at this time which is likely contributing to her nausea as well.  Treated with IV fluids and dextrose.  ED visit 06/12/2022 for weakness and vomiting. Was given GI cocktail and prescription for Zofran to use 4 mg every 8 hours as needed.  Also given 1 L fluids.  EKG with sinus rhythm and left atrial enlargement with incomplete right bundle branch block.  Prolonged QT in precordial leads.  No significant changes since prior EKG on 06/02/2022.  Today:   Having breakthrough symptoms about twice per week. Has been having some nausea and vomiting as well and taking Zofran to help with that. Has a good appetite. Blood sugars have been moderate. BG staying less than 200. Dialysis is going good. No trouble swallowing. Sometimes has diarrhea. Has looser stools a few times per week.  Renal medication sometimes makes her go to the bathroom if she does not take it with food. Denies any melena or brbpr. Weight is stable.   Reports she is on transplant list for [facility] but waiting on other facilities.   Current Outpatient Medications  Medication Sig Dispense Refill   B Complex-C-Folic Acid (DIALYVITE 657) 0.8 MG TABS Take 1 tablet by mouth daily.     blood glucose meter kit and supplies KIT Dispense based on  patient and insurance preference. Use up to four times daily as directed. 1 each 0   Blood Glucose Monitoring Suppl (ACCU-CHEK GUIDE ME) w/Device KIT 1 Piece by Does not apply route as directed. 1 kit 0   Continuous Blood Gluc Receiver (FREESTYLE LIBRE 2 READER) DEVI As directed 1 each 0   Continuous Blood Gluc Sensor (FREESTYLE LIBRE 2 SENSOR) MISC 1 Piece by Does not apply  route every 14 (fourteen) days. 2 each 3   Cyanocobalamin (B-12 PO) Take 1 capsule by mouth daily.     dorzolamide-timolol (COSOPT) 2-0.5 % ophthalmic solution Place 1 drop into the left eye 2 (two) times daily.     glucose blood (ACCU-CHEK GUIDE) test strip Use to monitor glucose 4 times a day as instructed 150 each 2   Insulin Glargine (BASAGLAR KWIKPEN) 100 UNIT/ML Inject 14 Units into the skin daily. (Patient taking differently: Inject 14 Units into the skin at bedtime.) 15 mL 2   Insulin Pen Needle 31G X 4 MM MISC Use as directed to administer insulin. 100 each 1   Methoxy PEG-Epoetin Beta (MIRCERA IJ) Mircera     Metoprolol Succinate 50 MG CS24 Take 50 mg by mouth at bedtime.     ondansetron (ZOFRAN-ODT) 4 MG disintegrating tablet 4mg  ODT q4 hours prn nausea/vomit (Patient taking differently: Take 4 mg by mouth every 4 (four) hours as needed for nausea or vomiting. 4mg  ODT q4 hours prn nausea/vomit) 10 tablet 0   pantoprazole (PROTONIX) 40 MG tablet Take 1 tablet (40 mg total) by mouth daily before breakfast. 30 tablet 3   prednisoLONE acetate (PRED FORTE) 1 % ophthalmic suspension Place 1 drop into the left eye 4 (four) times daily.     rosuvastatin (CRESTOR) 10 MG tablet Take 1 tablet by mouth daily.     sucroferric oxyhydroxide (VELPHORO) 500 MG chewable tablet Chew 500 mg by mouth 3 (three) times daily with meals.     Vitamin D, Ergocalciferol, (DRISDOL) 1.25 MG (50000 UNIT) CAPS capsule Take 50,000 Units by mouth every 7 (seven) days.     No current facility-administered medications for this visit.    Past Medical History:  Diagnosis Date   Acanthosis nigricans, acquired    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 chronic kidney disease ([place]) 09/25/2021   dialysis M-W-F at [facility] in [place]   Asthma    Diabetic autonomic neuropathy ([name])    Diabetic peripheral neuropathy ([place])    Environmental allergies    Goiter    History of blood transfusion    Hypertension    no  meds currently   Hypoglycemia associated with diabetes ([place])    Tachycardia    Thyroiditis, autoimmune    Type 1 diabetes mellitus in patient age 83-19 years with HbA1C goal below 7.5     Past Surgical History:  Procedure Laterality Date   AV FISTULA PLACEMENT Right 12/08/2021   Procedure: ARTERIOVENOUS  FISTULA CREATION VERSUS GRAFT;  Surgeon: [name], MD;  Location: [facility];  Service: Vascular;  Laterality: Right;   [place] Right 01/12/2022   Procedure: RIGHT ARM SECOND STAGE BASILIC VEIN TRANSPOSITION;  Surgeon: [name], MD;  Location: [facility];  Service: Vascular;  Laterality: Right;   BIOPSY  08/27/2016   Procedure: BIOPSY;  Surgeon: Danie Binder, MD;  [facility];  Service: Endoscopy;;  duodenum; gastric   BIOPSY  06/27/2019   Procedure: BIOPSY;  Surgeon: Daneil Dolin, MD;  [facility];  Service: Endoscopy;;   BIOPSY  02/09/2022   Procedure: BIOPSY;  Surgeon: Eloise Harman, DO;  [facility];  Service: Endoscopy;;   BREAST CYST EXCISION Right 11/12/2021   Procedure: RIGHT BREAST ABSCESS INCISION AND DRAINAGE;  Surgeon: Rolm Bookbinder, MD;  Location: [place];  Service: General;  Laterality: Right;  LMA   COLONOSCOPY     COLONOSCOPY WITH PROPOFOL N/A 02/09/2022   Procedure: COLONOSCOPY WITH PROPOFOL;  Surgeon: Eloise Harman, DO;  [facility];  Service: Endoscopy;  Laterality: N/A;  7:30am, asa 3, dialysis pt   ESOPHAGOGASTRODUODENOSCOPY N/A 08/27/2016   Dr. Oneida Alar: mild gastritis. Negative celiac. No obvious source for dyspepsia/diarrhea   ESOPHAGOGASTRODUODENOSCOPY (EGD) WITH PROPOFOL N/A 06/27/2019   rourk: Focal abnormality of the gastric mucosa likely due to trauma (heaving).  Biopsy showed mild gastritis, negative for H. pylori.  Esophageal dilation for history of dysphagia but normal-appearing esophagus.   ESOPHAGOGASTRODUODENOSCOPY (EGD) WITH PROPOFOL N/A 02/09/2022   Procedure:  ESOPHAGOGASTRODUODENOSCOPY (EGD) WITH PROPOFOL;  Surgeon: Eloise Harman, DO;  [facility];  Service: Endoscopy;  Laterality: N/A;   IR FLUORO GUIDE CV LINE RIGHT  10/13/2021   IR FLUORO GUIDE CV LINE RIGHT  10/19/2021   IR US GUIDE VASC ACCESS RIGHT  10/13/2021   IRRIGATION AND DEBRIDEMENT ABSCESS N/A 10/04/2021   Procedure: IRRIGATION AND DEBRIDEMENT NECK ABSCESS;  Surgeon: Ralene Ok, MD;  Location: [place];  Service: General;  Laterality: N/A;    Family History  Problem Relation Age of Onset   Diabetes Mother        Type II DM   Thyroid disease Mother    Diabetes Maternal Grandmother        Type II DM   Diabetes Cousin        Type II DM   Colon cancer Neg Hx    Colon polyps Neg Hx     Allergies as of 06/17/2022   (No Known Allergies)    Social History   Socioeconomic History   Marital status: Single    Spouse name: Not on file   Number of children: 0   Years of education: Not on file   Highest education level: High school graduate  Occupational History   Occupation: unemployed  Tobacco Use   Smoking status: Never   Smokeless tobacco: Never  Vaping Use   Vaping Use: Never used  Substance and Sexual Activity   Alcohol use: No   Drug use: No   Sexual activity: Not Currently    Birth control/protection: Abstinence, None  Other Topics Concern   Not on file  Social History Narrative   Not on file   Social Determinants of Health   Financial Resource Strain: Not on file  Food Insecurity: No Food Insecurity (07/02/2019)   Hunger Vital Sign    Worried About Running Out of Food in the Last Year: Never true    Ran Out of Food in the Last Year: Never true  Transportation Needs: No Transportation Needs (07/02/2019)   PRAPARE - Hydrologist (Medical): No    Lack of Transportation (Non-Medical): No  Physical Activity: Not on file  Stress: Not on file  Social Connections: Moderately Integrated (07/02/2019)   Social Connection  and Isolation Panel [NHANES]    Frequency of Communication with Friends and Family: More than three times a week    Frequency of Social Gatherings with Friends and Family: More than three times a week  Attends Religious Services: 1 to 4 times per year    Active Member of Clubs or Organizations: Yes    Attends Archivist Meetings: 1 to 4 times per year    Marital Status: Never married     Review of Systems   Gen: + weakness. Denies fever, chills, anorexia. Denies weight loss.  CV: Denies chest pain, palpitations, syncope, peripheral edema, and claudication. Resp: Denies dyspnea at rest, cough, wheezing, coughing up blood, and pleurisy. GI: See HPI Derm: Denies rash, itching, dry skin Psych: Denies depression, anxiety, memory loss, confusion. No homicidal or suicidal ideation.  Heme: Denies bruising, bleeding, and enlarged lymph nodes.   Physical Exam   BP (!) 165/105 (BP Location: Left Arm, Patient Position: Sitting, Cuff Size: Small)   Pulse 91   Temp 97.6 F (36.4 C) (Temporal)   Ht 5\' 9"  (1.753 m)   Wt 119 lb 6.4 oz (54.2 kg)   SpO2 99%   BMI 17.63 kg/m   General:   Alert and oriented. No distress noted. Pleasant and cooperative.  Head:  Normocephalic and atraumatic. Eyes: Sclera red to left eye, no scleral icterus.  Right eye with normal conjunctival and sclera. Mouth:  Oral mucosa pink and moist. Good dentition. No lesions. Lungs:  Clear to auscultation bilaterally. No wheezes, rales, or rhonchi. No distress.  Heart:  S1, S2 present without murmurs appreciated.  Abdomen:  +BS, soft, non-distended.  Mild TTP to LLQ and epigastrium.  No rebound or guarding. No HSM or masses noted. Rectal: Deferred Msk: Normal posture.  Unsteady gait, uses cane. Extremities:  Without edema. Neurologic:  Alert and  oriented x4 Psych:  Alert and cooperative. Normal mood and affect.   Assessment  IPEK WESTRA is a 27 y.o. female with a history of chronic pancreatitis  with stable pseudocyst, gastritis, end-stage renal disease on dialysis M, W, F, asthma, type 1 diabetes, HTN, and recent cataract surgery presenting today for follow-up.  Iron deficiency anemia: Hgb 11.7 on 06/12/22.  Likely multifactorial in setting of end-stage renal disease, currently on dialysis M, W, F.  Recent EGD and colonoscopy 03/08/2022.  EGD with gastritis with slight chronic inflammation, normal duodenum.  Colonoscopy with nonbleeding internal hemorrhoids, and exam otherwise normal.  Patient currently denies any melena, BRBPR, lack of appetite, weight loss, constipation.  Continues to have fatigue related to dialysis and possibly also secondary to nausea/vomiting.  Diarrhea, chronic pancreatitis:  Does have occasional diarrhea which could be in the setting of chronic pancreatitis as well as related to one of her renal medications if she does not take it with food.  Denies lack of appetite or weight loss.  Not currently on any pancreatic enzymes.  GERD/dysphagia, intermittent nausea and vomiting: Intermittent nausea and vomiting, possibly secondary to uncontrolled reflux/gastritis.  Recent EGD confirming chronic gastritis.  Has been maintained on daily PPI.  Symptoms occurring about twice per week.  Has been using Zofran as needed.  For now recommend using pantoprazole 40 mg twice daily, taking 30 minutes prior to breakfast and dinner.  GERD diet/lifestyle modifications reinforced today.   PLAN   Increase pantoprazole to 40 mg twice daily. 30 minutes before breakfast and dinner Continue Zofran 4 mg ODT as needed.  Renal diet and GERD diet.  Check CBC in 2 months.  Monitor blood pressure regularly, follow-up with nephrology and/your PCP remains elevated greater than 140/90 Follow-up in 3 months, sooner if needed.    Venetia Night, MSN, FNP-BC, AGACNP-BC [facility]

## 2022-06-17 ENCOUNTER — Ambulatory Visit (INDEPENDENT_AMBULATORY_CARE_PROVIDER_SITE_OTHER): Payer: Medicare Other | Admitting: Gastroenterology

## 2022-06-17 ENCOUNTER — Encounter: Payer: Self-pay | Admitting: Gastroenterology

## 2022-06-17 VITALS — BP 155/82 | HR 91 | Temp 97.6°F | Ht 69.0 in | Wt 119.4 lb

## 2022-06-17 DIAGNOSIS — D509 Iron deficiency anemia, unspecified: Secondary | ICD-10-CM

## 2022-06-17 DIAGNOSIS — K219 Gastro-esophageal reflux disease without esophagitis: Secondary | ICD-10-CM | POA: Diagnosis not present

## 2022-06-17 DIAGNOSIS — R195 Other fecal abnormalities: Secondary | ICD-10-CM

## 2022-06-17 DIAGNOSIS — K861 Other chronic pancreatitis: Secondary | ICD-10-CM | POA: Diagnosis not present

## 2022-06-17 DIAGNOSIS — R112 Nausea with vomiting, unspecified: Secondary | ICD-10-CM

## 2022-06-17 NOTE — Patient Instructions (Addendum)
I want you to increase her pantoprazole to 40 mg twice daily, 30 minutes prior to breakfast and dinner.  Follow a GERD diet:  Avoid fried, fatty, greasy, spicy, citrus foods.  Also avoid tomato-based foods such as spaghetti sauce, pizza sauce if this is bothersome. Avoid caffeine and carbonated beverages. Avoid chocolate. Try eating 4-6 small meals a day rather than 3 large meals. Do not eat within 3 hours of laying down. Prop head of bed up on wood or bricks to create a 6 inch incline. For some people currently because also a trigger.  Monitor for triggers and avoid those as much as possible. Avoid NSAIDs  Continue to follow a renal diet and good control of blood sugars.  Your anemia is likely secondary to your end-stage renal disease.  Last hemoglobin was stable at 11.7.  Monitor for any rectal bleeding or black/dark/tarry stools and notify us immediately if this occurs.  We will plan to follow-up in 3 months, sooner if needed.  It was a pleasure to see you today. I want to create trusting relationships with patients. If you receive a survey regarding your visit,  I greatly appreciate you taking time to fill this out on paper or through your MyChart. I value your feedback.  Venetia Night, MSN, FNP-BC, AGACNP-BC [facility]

## 2022-06-23 ENCOUNTER — Encounter (HOSPITAL_COMMUNITY): Payer: Self-pay | Admitting: Hematology

## 2022-07-01 ENCOUNTER — Ambulatory Visit
Admission: EM | Admit: 2022-07-01 | Discharge: 2022-07-01 | Disposition: A | Discharge status: Home / Self Care | Payer: Medicare Other | Attending: Nurse Practitioner | Admitting: Nurse Practitioner

## 2022-07-01 ENCOUNTER — Encounter: Payer: Self-pay | Admitting: Emergency Medicine

## 2022-07-01 DIAGNOSIS — L03213 Periorbital cellulitis: Secondary | ICD-10-CM | POA: Diagnosis not present

## 2022-07-01 MED ORDER — AMOXICILLIN-POT CLAVULANATE 875-125 MG PO TABS: 1.0000 | ORAL_TABLET | Freq: Two times a day (BID) | ORAL | 0 refills | Status: DC

## 2022-07-01 MED ORDER — CEFTRIAXONE SODIUM 1 G IJ SOLR
1.0000 g | Freq: Once | INTRAMUSCULAR | Status: AC
  Administered 2022-07-01: 1 g via INTRAMUSCULAR

## 2022-07-01 MED ORDER — SULFAMETHOXAZOLE-TRIMETHOPRIM 800-160 MG PO TABS: 1.0000 | ORAL_TABLET | Freq: Two times a day (BID) | ORAL | 0 refills | Status: AC

## 2022-07-01 NOTE — ED Triage Notes (Signed)
Right eye swelling since yesterday.  Left eye is red as well, states she had recent surgery on left eye.

## 2022-07-01 NOTE — Discharge Instructions (Addendum)
Take medication as prescribed.  Please make sure you take the entire course of the medication prescribed. Cool compresses to the right eye to help with pain and swelling. May take over-the-counter Tylenol as needed for pain, fever, or general discomfort. Continue to monitor the area for worsening.  If swelling does not improve within the next 24 hours, or if you develop new symptoms such as fever, chills, chest pain, abdominal pain, nausea, vomiting, or diarrhea, please go to the emergency department immediately for further evaluation. Please follow-up with your primary care physician or eye doctor as scheduled. Follow-up as needed.

## 2022-07-01 NOTE — ED Provider Notes (Signed)
[facility]    CSN: [id number] Arrival date & time: 07/01/22  1913      History   Chief Complaint No chief complaint on file.   HPI Lindsey Murillo is a 27 y.o. female.   The history is provided by the patient and a relative.   The patient will presents with her mother for complaints of swelling to the right eye.  Patient's mother states symptoms started over the past 24 hours.  She states prior to the swelling to the eye, the patient had a "bite" to the lateral aspect of the right eye.  She states the swelling worsened over the past several hours.  Patient states that she is able to "see out of her eye", but that it is painful.  She denies drainage, tearing, light sensitivity, headache, or visual changes from the right eye.  Patient had cataract surgery on her left eye, and left eye is with redness as well.  She is on eyedrops for that at this time.  Patient does have a significant past medical history to include diabetic neuropathy, end-stage renal disease on hemodialysis, hypertension, and thyroid disease. Past Medical History:  Diagnosis Date   Acanthosis nigricans, acquired    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 chronic kidney disease ([place]) 09/25/2021   dialysis M-W-F at [facility] in [place]   Asthma    Diabetic autonomic neuropathy ([place])    Diabetic peripheral neuropathy ([place])    Environmental allergies    Goiter    History of blood transfusion    Hypertension    no meds currently   Hypoglycemia associated with diabetes ([place])    Tachycardia    Thyroiditis, autoimmune    Type 1 diabetes mellitus in patient age 44-19 years with HbA1C goal below 7.5     Patient Active Problem List   Diagnosis Date Noted   Musculoskeletal pain 04/20/2022   Other fluid overload 01/26/2022   Non-adherence to medical treatment 01/21/2022   ESRD (end stage renal disease) ([place]) 12/18/2021   Hypertensive chronic kidney disease with stage 5 chronic kidney disease or  end stage renal disease ([place]) 10/23/2021   Noninfective gastroenteritis and colitis, unspecified 10/23/2021   Renal osteodystrophy 10/23/2021   Acanthosis nigricans 10/21/2021   Allergy, unspecified, initial encounter 10/21/2021   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 10/21/2021   Coagulation defect, unspecified ([place]) 10/21/2021   Dyspnea, unspecified 10/21/2021   Encounter for immunization 10/21/2021   Nausea 10/21/2021   Other specified nontoxic goiter 10/21/2021   Pain, unspecified 10/21/2021   Pruritus, unspecified 10/21/2021   Secondary hyperparathyroidism of renal origin ([place]) 10/21/2021   Dependence on renal dialysis ([place]) 10/20/2021   Physical deconditioning 10/07/2021   Protein-calorie malnutrition, severe ([place]) 10/02/2021   Depression 09/25/2021   Anemia in ESRD (end-stage renal disease) ([place]) 09/25/2021   Normocytic anemia 06/26/2021   Peripheral edema 06/13/2021   Iron deficiency anemia 06/02/2021   Nephrotic syndrome 05/27/2021   Chronic pancreatitis ([place]) 08/22/2019   Pancreatic pseudocyst/cyst 08/22/2019   GERD (gastroesophageal reflux disease) 08/22/2019   Uncontrolled type 1 diabetes mellitus with hyperglycemia ([place]) 05/12/2015   Essential hypertension, benign 11/30/2012   Asthma     Past Surgical History:  Procedure Laterality Date   AV FISTULA PLACEMENT Right 12/08/2021   Procedure: ARTERIOVENOUS  FISTULA CREATION VERSUS GRAFT;  Surgeon: [name], MD;  Location: [facility];  Service: Vascular;  Laterality: Right;   [place] Right 01/12/2022   Procedure: RIGHT ARM SECOND  STAGE BASILIC VEIN TRANSPOSITION;  Surgeon: [name], MD;  Location: [facility];  Service: Vascular;  Laterality: Right;   BIOPSY  08/27/2016   Procedure: BIOPSY;  Surgeon: Danie Binder, MD;  Location: [facility];  Service: Endoscopy;;  duodenum; gastric   BIOPSY  06/27/2019   Procedure: BIOPSY;  Surgeon: Daneil Dolin, MD;  Location: [facility];  Service: Endoscopy;;   BIOPSY  02/09/2022   Procedure: BIOPSY;  Surgeon: Eloise Harman, DO;  [facility];  Service: Endoscopy;;   BREAST CYST EXCISION Right 11/12/2021   Procedure: RIGHT BREAST ABSCESS INCISION AND DRAINAGE;  Surgeon: Rolm Bookbinder, MD;  Location: [facility];  Service: General;  Laterality: Right;  LMA   COLONOSCOPY     COLONOSCOPY WITH PROPOFOL N/A 02/09/2022   Procedure: COLONOSCOPY WITH PROPOFOL;  Surgeon: Eloise Harman, DO;  [facility];  Service: Endoscopy;  Laterality: N/A;  7:30am, asa 3, dialysis pt   ESOPHAGOGASTRODUODENOSCOPY N/A 08/27/2016   Dr. Oneida Alar: mild gastritis. Negative celiac. No obvious source for dyspepsia/diarrhea   ESOPHAGOGASTRODUODENOSCOPY (EGD) WITH PROPOFOL N/A 06/27/2019   rourk: Focal abnormality of the gastric mucosa likely due to trauma (heaving).  Biopsy showed mild gastritis, negative for H. pylori.  Esophageal dilation for history of dysphagia but normal-appearing esophagus.   ESOPHAGOGASTRODUODENOSCOPY (EGD) WITH PROPOFOL N/A 02/09/2022   Procedure: ESOPHAGOGASTRODUODENOSCOPY (EGD) WITH PROPOFOL;  Surgeon: Eloise Harman, DO;  [facility];  Service: Endoscopy;  Laterality: N/A;   IR FLUORO GUIDE CV LINE RIGHT  10/13/2021   IR FLUORO GUIDE CV LINE RIGHT  10/19/2021   IR US GUIDE VASC ACCESS RIGHT  10/13/2021   IRRIGATION AND DEBRIDEMENT ABSCESS N/A 10/04/2021   Procedure: IRRIGATION AND DEBRIDEMENT NECK ABSCESS;  Surgeon: Ralene Ok, MD;  Location: [facility];  Service: General;  Laterality: N/A;    OB History     Gravida  1   Para      Term      Preterm      AB      Living  0      SAB      IAB      Ectopic      Multiple      Live Births               Home Medications    Prior to Admission medications   Medication Sig Start Date End Date Taking? Authorizing Provider  amoxicillin-clavulanate (AUGMENTIN) 875-125 MG tablet Take 1 tablet by mouth every 12 (twelve)  hours. 07/01/22  Yes Veleria Barnhardt-Warren, Alda Lea, NP  sulfamethoxazole-trimethoprim (BACTRIM DS) 800-160 MG tablet Take 1 tablet by mouth 2 (two) times daily for 7 days. 07/01/22 07/08/22 Yes Barbette Mcglaun-Warren, Alda Lea, NP  B Complex-C-Folic Acid (DIALYVITE Q000111Q) 0.8 MG TABS Take 1 tablet by mouth daily. 11/16/21   [provider]  blood glucose meter kit and supplies KIT Dispense based on patient and insurance preference. Use up to four times daily as directed. 05/26/21   Coral Spikes, DO  Blood Glucose Monitoring Suppl (ACCU-CHEK GUIDE ME) w/Device KIT 1 Piece by Does not apply route as directed. 01/21/22   Cassandria Anger, MD  Continuous Blood Gluc Receiver (FREESTYLE LIBRE 2 READER) DEVI As directed 02/04/22   Cassandria Anger, MD  Continuous Blood Gluc Sensor (FREESTYLE LIBRE 2 SENSOR) MISC 1 Piece by Does not apply route every 14 (fourteen) days. 02/04/22   Cassandria Anger, MD  Cyanocobalamin (  B-12 PO) Take 1 capsule by mouth daily.    [provider]  dorzolamide-timolol (COSOPT) 2-0.5 % ophthalmic solution Place 1 drop into the left eye 2 (two) times daily. 05/20/22 05/20/23  [provider]  glucose blood (ACCU-CHEK GUIDE) test strip Use to monitor glucose 4 times a day as instructed 01/21/22   Cassandria Anger, MD  Insulin Glargine (BASAGLAR KWIKPEN) 100 UNIT/ML Inject 14 Units into the skin daily. Patient taking differently: Inject 14 Units into the skin at bedtime. 06/03/22   Coral Spikes, DO  Insulin Pen Needle 31G X 4 MM MISC Use as directed to administer insulin. 10/22/21   Eugenie Filler, MD  Methoxy PEG-Epoetin Beta (MIRCERA IJ) Mircera 02/05/22 02/04/23  [provider]  Metoprolol Succinate 50 MG CS24 Take 50 mg by mouth at bedtime.    [provider]  ondansetron (ZOFRAN-ODT) 4 MG disintegrating tablet 70m ODT q4 hours prn nausea/vomit Patient taking differently: Take 4 mg by mouth every 4 (four) hours as needed for nausea or  vomiting. 467mODT q4 hours prn nausea/vomit 05/10/22   ZaMilton FergusonMD  pantoprazole (PROTONIX) 40 MG tablet Take 1 tablet (40 mg total) by mouth daily before breakfast. 01/19/22   LeMahala MenghiniPA-C  prednisoLONE acetate (PRED FORTE) 1 % ophthalmic suspension Place 1 drop into the left eye 4 (four) times daily. 06/01/22   [provider]  rosuvastatin (CRESTOR) 10 MG tablet Take 1 tablet by mouth daily.    [provider]  sucroferric oxyhydroxide (VELPHORO) 500 MG chewable tablet Chew 500 mg by mouth 3 (three) times daily with meals. 11/16/21   [provider]  Vitamin D, Ergocalciferol, (DRISDOL) 1.25 MG (50000 UNIT) CAPS capsule Take 50,000 Units by mouth every 7 (seven) days.    [provider]    Family History Family History  Problem Relation Age of Onset   Diabetes Mother        Type II DM   Thyroid disease Mother    Diabetes Maternal Grandmother        Type II DM   Diabetes Cousin        Type II DM   Colon cancer Neg Hx    Colon polyps Neg Hx     Social History Social History   Tobacco Use   Smoking status: Never   Smokeless tobacco: Never  Vaping Use   Vaping Use: Never used  Substance Use Topics   Alcohol use: No   Drug use: No     Allergies   Patient has no known allergies.   Review of Systems Review of Systems Per HPI  Physical Exam Triage Vital Signs ED Triage Vitals  Enc Vitals Group     BP 07/01/22 1925 131/84     Pulse Rate 07/01/22 1925 (!) 116     Resp 07/01/22 1925 18     Temp 07/01/22 1925 98.4 F (36.9 C)     Temp Source 07/01/22 1925 Oral     SpO2 07/01/22 1925 96 %     Weight --      Height --      Head Circumference --      Peak Flow --      Pain Score 07/01/22 1926 4     Pain Loc --      Pain Edu? --      Excl. in [place]--    No data found.  Updated Vital Signs BP 131/84 (BP Location: Right Arm)  Pulse (!) 116   Temp 98.4 F (36.9 C) (Oral)   Resp 18   SpO2 96%   Visual  Acuity Right Eye Distance:   Left Eye Distance:   Bilateral Distance:    Right Eye Near:   Left Eye Near:    Bilateral Near:     Physical Exam Vitals and nursing note reviewed.  Constitutional:      General: She is not in acute distress.    Appearance: Normal appearance.  HENT:     Head: Normocephalic.     Right Ear: Tympanic membrane, ear canal and external ear normal.     Left Ear: Tympanic membrane, ear canal and external ear normal.     Nose: Nose normal.  Eyes:     General:        Right eye: No foreign body, discharge or hordeolum.     Extraocular Movements: Extraocular movements intact.     Right eye: Normal extraocular motion and no nystagmus.     Conjunctiva/sclera:     Right eye: Right conjunctiva is not injected. No chemosis, exudate or hemorrhage.    Pupils: Pupils are equal, round, and reactive to light.     Comments: Erythema and swelling noted to the periorbital space of the right eye.  There is no oozing, fluctuance, or drainage present. Induration noted to the lateral aspect of the right eye.  Area is erythematous, with no oozing, fluctuance, or drainage present.  Neurological:     Mental Status: She is alert.      UC Treatments / Results  Labs (all labs ordered are listed, but only abnormal results are displayed) Labs Reviewed - No data to display  EKG   Radiology No results found.  Procedures Procedures (including critical care time)  Medications Ordered in UC Medications  cefTRIAXone (ROCEPHIN) injection 1 g (1 g Intramuscular Given 07/01/22 1951)    Initial Impression / Assessment and Plan / UC Course  I have reviewed the triage vital signs and the nursing notes.  Pertinent labs & imaging results that were available during my care of the patient were reviewed by me and considered in my medical decision making (see chart for details).  The patient is tachycardic, but vital signs are otherwise stable.  Patient with moderate erythema and  swelling to the periorbital space around the right eye.  Treat patient with periorbital cellulitis of the right eye.  Because of the time of this appointment, patient is unable to retrieve antibiotics tonight.  Rocephin 1 g IM was given.  Patient was prescribed Augmentin 875/125 mg tablets and Bactrim DS 800/160 mg tablets for her symptoms.  Supportive care recommendations were provided to the patient to include cool compresses to the right eye and over-the-counter Tylenol for pain or discomfort.  Patient was given strict ER follow-up precautions, advising that if symptoms do not improve within the next 24 hours, she should go to the emergency department immediately.  Patient and mother verbalized understanding.  All questions were answered.  Patient stable for discharge.   Final Clinical Impressions(s) / UC Diagnoses   Final diagnoses:  Periorbital cellulitis of right eye     Discharge Instructions      Take medication as prescribed.  Please make sure you take the entire course of the medication prescribed. Cool compresses to the right eye to help with pain and swelling. May take over-the-counter Tylenol as needed for pain, fever, or general discomfort. Continue to monitor the area for worsening.  If swelling  does not improve within the next 24 hours, or if you develop new symptoms such as fever, chills, chest pain, abdominal pain, nausea, vomiting, or diarrhea, please go to the emergency department immediately for further evaluation. Please follow-up with your primary care physician or eye doctor as scheduled. Follow-up as needed.      ED Prescriptions     Medication Sig Dispense Auth. Provider   sulfamethoxazole-trimethoprim (BACTRIM DS) 800-160 MG tablet Take 1 tablet by mouth 2 (two) times daily for 7 days. 14 tablet Kiegan Macaraeg-Warren, Alda Lea, NP   amoxicillin-clavulanate (AUGMENTIN) 875-125 MG tablet Take 1 tablet by mouth every 12 (twelve) hours. 14 tablet Mariam Helbert-Warren, Alda Lea,  NP      PDMP not reviewed this encounter.   Tish Men, NP 07/01/22 1956

## 2022-07-03 ENCOUNTER — Other Ambulatory Visit: Payer: Self-pay | Admitting: Family Medicine

## 2022-07-07 NOTE — Telephone Encounter (Signed)
Called and left message to clarify  this request

## 2022-07-20 ENCOUNTER — Ambulatory Visit (INDEPENDENT_AMBULATORY_CARE_PROVIDER_SITE_OTHER): Payer: Medicare Other | Admitting: Family Medicine

## 2022-07-20 ENCOUNTER — Encounter (HOSPITAL_COMMUNITY): Payer: Self-pay | Admitting: Hematology

## 2022-07-20 ENCOUNTER — Encounter: Payer: Self-pay | Admitting: Family Medicine

## 2022-07-20 VITALS — BP 144/91 | HR 95 | Ht 69.0 in | Wt 120.2 lb

## 2022-07-20 DIAGNOSIS — E1065 Type 1 diabetes mellitus with hyperglycemia: Secondary | ICD-10-CM | POA: Diagnosis not present

## 2022-07-20 DIAGNOSIS — E785 Hyperlipidemia, unspecified: Secondary | ICD-10-CM

## 2022-07-20 DIAGNOSIS — N186 End stage renal disease: Secondary | ICD-10-CM | POA: Diagnosis not present

## 2022-07-20 DIAGNOSIS — D631 Anemia in chronic kidney disease: Secondary | ICD-10-CM

## 2022-07-20 MED ORDER — ROSUVASTATIN CALCIUM 10 MG PO TABS: 10.0000 mg | ORAL_TABLET | Freq: Every day | ORAL | 3 refills | Status: DC

## 2022-07-20 MED ORDER — PANTOPRAZOLE SODIUM 40 MG PO TBEC: 40.0000 mg | DELAYED_RELEASE_TABLET | Freq: Every day | ORAL | 3 refills | Status: DC

## 2022-07-20 NOTE — Patient Instructions (Signed)
Continue your medication.  Labs today.  Follow up in 3 months.  

## 2022-07-21 ENCOUNTER — Encounter: Payer: Self-pay | Admitting: Family Medicine

## 2022-07-21 ENCOUNTER — Telehealth: Payer: Self-pay | Admitting: *Deleted

## 2022-07-21 DIAGNOSIS — E785 Hyperlipidemia, unspecified: Secondary | ICD-10-CM | POA: Insufficient documentation

## 2022-07-21 LAB — LIPID PANEL
Chol/HDL Ratio: 2.6 ratio (ref 0.0–4.4)
Cholesterol, Total: 190 mg/dL (ref 100–199)
HDL: 73 mg/dL (ref >39)
LDL Chol Calc (NIH): 46 mg/dL (ref 0–99)
Triglycerides: 500 mg/dL — ABNORMAL HIGH (ref 0–149)
VLDL Cholesterol Cal: 71 mg/dL — ABNORMAL HIGH (ref 5–40)

## 2022-07-21 LAB — FOLATE: Folate: 12.5 ng/mL (ref >3.0)

## 2022-07-21 LAB — HEMOGLOBIN A1C
Est. average glucose Bld gHb Est-mCnc: 255 mg/dL
Hgb A1c MFr Bld: 10.5 % — ABNORMAL HIGH (ref 4.8–5.6)

## 2022-07-21 LAB — VITAMIN B12: Vitamin B-12: >2000 pg/mL — ABNORMAL HIGH (ref 232–1245)

## 2022-07-21 MED ORDER — BASAGLAR KWIKPEN 100 UNIT/ML ~~LOC~~ SOPN: 18.0000 [IU] | PEN_INJECTOR | Freq: Every day | SUBCUTANEOUS | 2 refills | Status: DC

## 2022-07-21 NOTE — Assessment & Plan Note (Signed)
Advised patient to discuss dizziness and potential hypotension with nephrology.

## 2022-07-21 NOTE — Telephone Encounter (Signed)
Pantoprazole '40mg'$  was approved. Informed pt.

## 2022-07-21 NOTE — Assessment & Plan Note (Signed)
Uncontrolled and worsening.  A1c returned at 10.5.  Needs increase in [place].  Needs follow-up with endocrinology. Increase Basaglar to 18 units.

## 2022-07-21 NOTE — Progress Notes (Signed)
Subjective:  Patient ID: Lindsey Murillo, female    DOB: 1996-03-02  Age: 27 y.o. MRN: [id number]  CC: Chief Complaint  Patient presents with   Follow-up    Follow up for 3 month     HPI:  27 year old female with an extensive past medical history including uncontrolled type 1 diabetes, hypertension, end-stage renal disease on hemodialysis presents for follow-up.  Patient states that she is feeling fairly well.  She states that the continuous glucometer did not work well as it came off her arm frequently.  She does not check her blood sugars very often.  She states that she tries to check them twice a week.  He states that they have been in the 150s.  She has a history of noncompliance.  She states that she is taking her Basaglar as prescribed.  I am unsure if this is the case.  She states that she is taking 14 units nightly.  Patient inquiring about supplementation with B12 and folate.  Needs repeat testing.  Patient has an upcoming appointment regarding renal transplant.  I advised her that she needs to be compliant and get her diabetes in control so that she can get a renal transplant which she desperately needs.  She reports intermittent dizziness when going from lying or sitting to standing.  She is concerned that her blood pressure is dropping and needs medication to keep her blood pressure up.  Patient Active Problem List   Diagnosis Date Noted   Hyperlipidemia 07/21/2022   Non-adherence to medical treatment 01/21/2022   ESRD (end stage renal disease) ([place]) 12/18/2021   Secondary hyperparathyroidism of renal origin ([place]) 10/21/2021   Physical deconditioning 10/07/2021   Protein-calorie malnutrition, severe ([place]) 10/02/2021   Depression 09/25/2021   Anemia in ESRD (end-stage renal disease) ([place]) 09/25/2021   Peripheral edema 06/13/2021   Nephrotic syndrome 05/27/2021   Chronic pancreatitis ([place]) 08/22/2019   Pancreatic pseudocyst/cyst 08/22/2019   GERD (gastroesophageal  reflux disease) 08/22/2019   Uncontrolled type 1 diabetes mellitus with hyperglycemia ([place]) 05/12/2015   Essential hypertension, benign 11/30/2012   Asthma     Social Hx   Social History   Socioeconomic History   Marital status: Single    Spouse name: Not on file   Number of children: 0   Years of education: Not on file   Highest education level: High school graduate  Occupational History   Occupation: unemployed  Tobacco Use   Smoking status: Never   Smokeless tobacco: Never  Vaping Use   Vaping Use: Never used  Substance and Sexual Activity   Alcohol use: No   Drug use: No   Sexual activity: Not Currently    Birth control/protection: Abstinence, None  Other Topics Concern   Not on file  Social History Narrative   Not on file   Social Determinants of Health   Financial Resource Strain: Not on file  Food Insecurity: No Food Insecurity (07/02/2019)   Hunger Vital Sign    Worried About Running Out of Food in the Last Year: Never true    Ran Out of Food in the Last Year: Never true  Transportation Needs: No Transportation Needs (07/02/2019)   PRAPARE - Hydrologist (Medical): No    Lack of Transportation (Non-Medical): No  Physical Activity: Not on file  Stress: Not on file  Social Connections: Moderately Integrated (07/02/2019)   Social Connection and Isolation Panel [NHANES]    Frequency of Communication with Friends and Family:  More than three times a week    Frequency of Social Gatherings with Friends and Family: More than three times a week    Attends Religious Services: 1 to 4 times per year    Active Member of [employer] or Organizations: Yes    Attends Archivist Meetings: 1 to 4 times per year    Marital Status: Never married    Review of Systems  Respiratory: Negative.    Cardiovascular: Negative.   Neurological:  Positive for dizziness.   Objective:  BP (!) 144/91   Pulse 95   Ht '5\' 9"'$  (1.753 m)   Wt 120 lb 3.2 oz  (54.5 kg)   SpO2 98%   BMI 17.75 kg/m      07/20/2022    1:44 PM 07/01/2022    7:25 PM 06/17/2022    9:12 AM  BP/Weight  Systolic BP [id number]  Diastolic BP 91 84 82  Wt. (Lbs) 120.2    BMI 17.75 kg/m2      Physical Exam Vitals and nursing note reviewed.  Constitutional:      General: She is not in acute distress. HENT:     Head: Normocephalic and atraumatic.  Cardiovascular:     Rate and Rhythm: Normal rate and regular rhythm.  Pulmonary:     Effort: Pulmonary effort is normal.     Breath sounds: Normal breath sounds. No wheezing or rales.  Neurological:     Mental Status: She is alert.  Psychiatric:     Comments: Flat affect.     Lab Results  Component Value Date   WBC 6.4 06/12/2022   HGB 11.7 (L) 06/12/2022   HCT 35.2 (L) 06/12/2022   PLT 136 (L) 06/12/2022   GLUCOSE 225 (H) 06/12/2022   CHOL 190 07/20/2022   TRIG 500 (H) 07/20/2022   HDL 73 07/20/2022   LDLCALC 46 07/20/2022   ALT 79 (H) 06/12/2022   AST 38 06/12/2022   NA 129 (L) 06/12/2022   K 4.7 06/12/2022   CL 92 (L) 06/12/2022   CREATININE 5.14 (H) 06/12/2022   BUN 33 (H) 06/12/2022   CO2 23 06/12/2022   TSH 0.975 10/03/2021   INR 1.4 (H) 10/03/2021   HGBA1C 10.5 (H) 07/20/2022   MICROALBUR 1,195.2 (H) 06/13/2021     Assessment & Plan:   Problem List Items Addressed This Visit       Endocrine   Uncontrolled type 1 diabetes mellitus with hyperglycemia ([place]) - Primary    Uncontrolled and worsening.  A1c returned at 10.5.  Needs increase in [place].  Needs follow-up with endocrinology. Increase Basaglar to 18 units.      Relevant Medications   rosuvastatin (CRESTOR) 10 MG tablet   Insulin Glargine (BASAGLAR KWIKPEN) 100 UNIT/ML   Other Relevant Orders   Hemoglobin A1c (Completed)     Genitourinary   Anemia in ESRD (end-stage renal disease) ([place])   Relevant Orders   Folate (Completed)   Vitamin B12 (Completed)   ESRD (end stage renal disease) ([place])    Advised patient to discuss  dizziness and potential hypotension with nephrology.         Other   Hyperlipidemia    LDL has returned at goal.  Triglycerides elevated.  Likely due to uncontrolled diabetes.  Continue Crestor.      Relevant Medications   rosuvastatin (CRESTOR) 10 MG tablet   Other Relevant Orders   Lipid panel (Completed)    Meds ordered this encounter  Medications  rosuvastatin (CRESTOR) 10 MG tablet    Sig: Take 1 tablet (10 mg total) by mouth daily.    Dispense:  90 tablet    Refill:  3   pantoprazole (PROTONIX) 40 MG tablet    Sig: Take 1 tablet (40 mg total) by mouth daily before breakfast.    Dispense:  90 tablet    Refill:  3   Insulin Glargine (BASAGLAR KWIKPEN) 100 UNIT/ML    Sig: Inject 18 Units into the skin at bedtime.    Dispense:  15 mL    Refill:  2    Follow-up:  Return in about 3 months (around 10/20/2022).  [place]

## 2022-07-21 NOTE — Assessment & Plan Note (Signed)
LDL has returned at goal.  Triglycerides elevated.  Likely due to uncontrolled diabetes.  Continue Crestor.

## 2022-07-24 DIAGNOSIS — Z9289 Personal history of other medical treatment: Secondary | ICD-10-CM | POA: Insufficient documentation

## 2022-07-24 DIAGNOSIS — L83 Acanthosis nigricans: Secondary | ICD-10-CM | POA: Insufficient documentation

## 2022-07-24 DIAGNOSIS — I10 Essential (primary) hypertension: Secondary | ICD-10-CM | POA: Insufficient documentation

## 2022-07-29 ENCOUNTER — Encounter (HOSPITAL_COMMUNITY): Payer: Self-pay | Admitting: Hematology

## 2022-08-05 ENCOUNTER — Encounter: Payer: Self-pay | Admitting: Allergy

## 2022-08-05 ENCOUNTER — Ambulatory Visit (INDEPENDENT_AMBULATORY_CARE_PROVIDER_SITE_OTHER): Payer: Medicare Other | Admitting: Allergy

## 2022-08-05 ENCOUNTER — Other Ambulatory Visit: Payer: Self-pay

## 2022-08-05 VITALS — BP 150/98 | HR 94 | Temp 98.0°F | Resp 16 | Ht 69.0 in | Wt 125.6 lb

## 2022-08-05 DIAGNOSIS — H1013 Acute atopic conjunctivitis, bilateral: Secondary | ICD-10-CM

## 2022-08-05 DIAGNOSIS — J3089 Other allergic rhinitis: Secondary | ICD-10-CM

## 2022-08-05 DIAGNOSIS — J4540 Moderate persistent asthma, uncomplicated: Secondary | ICD-10-CM

## 2022-08-05 MED ORDER — OLOPATADINE HCL 0.2 % OP SOLN: 1.0000 [drp] | Freq: Every day | OPHTHALMIC | 5 refills | Status: DC

## 2022-08-05 MED ORDER — CETIRIZINE HCL 5 MG PO TABS: ORAL_TABLET | ORAL | 5 refills | Status: DC

## 2022-08-05 MED ORDER — ALBUTEROL SULFATE HFA 108 (90 BASE) MCG/ACT IN AERS: INHALATION_SPRAY | RESPIRATORY_TRACT | 1 refills | Status: DC

## 2022-08-05 MED ORDER — ARNUITY ELLIPTA 100 MCG/ACT IN AEPB: 1.0000 | INHALATION_SPRAY | Freq: Every day | RESPIRATORY_TRACT | 5 refills | Status: DC

## 2022-08-05 NOTE — Progress Notes (Signed)
New Patient Note  RE: Lindsey Murillo MRN: [id number] DOB: 07-21-1995 Date of Office Visit: 08/05/2022   Primary care provider: Coral Spikes, DO  Chief Complaint: "just to get allergy testing"  History of present illness: Lindsey Murillo is a 27 y.o. female presenting today for evaluation of rhinoconjunctivitis.  She presents today with her mother.  Mother states sometimes her eyes get puffy, watery, red and itchy. She also reports nosal congestion/drainage.  Occasional sneezing. Occasional throat clearing.  Symptoms are year-round and worse during pollen season.  Has tried generic benadryl as needed.   She prefers not to do any nose sprays.  She will use eye drops.  Mother states she does note that she wheezes.  She does report chest tightness and shortness of breath.  She has never been diagnosed with asthma.  Mother is wondering if she does have it.  She has an albuterol inhaler but it is most likely expired.   She she was prescribed it about a year or more ago.  She states when she did use it initially it was helpful for her respiratory symptoms.  She recently did get a inhaler OTC that she use if 1-2 times a week and states has had a little bit of relief.  She is not sure this is called but the only over-the-counter inhaler currently is Primatene Mist.  No history of eczema or food allergy (however she states she is not sure if she does have food allergy or not and wants to be tested).    Review of systems: Review of Systems  Constitutional: Negative.   HENT:         See HPI  Eyes:        See HPI  Respiratory:         See HPI  Cardiovascular: Negative.   Gastrointestinal: Negative.   Musculoskeletal: Negative.   Skin: Negative.   Allergic/Immunologic: Negative.   Neurological: Negative.     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 10/21/2021   Diabetic autonomic neuropathy ([name])    Diabetic peripheral neuropathy ([place])    Environmental allergies    Goiter    History of blood transfusion    Hypertension    no meds currently   Hypoglycemia associated with diabetes ([place])    Musculoskeletal pain 04/20/2022   Tachycardia    Thyroiditis, autoimmune    Type 1 diabetes mellitus in patient age 71-19 years with HbA1C goal below 7.5     Past surgical history: Past Surgical History:  Procedure Laterality Date   AV FISTULA PLACEMENT Right 12/08/2021   Procedure: ARTERIOVENOUS  FISTULA CREATION VERSUS GRAFT;  Surgeon: [name], MD;  Location: [facility];  Service: Vascular;  Laterality: Right;   [place] Right 01/12/2022   Procedure: RIGHT ARM SECOND STAGE BASILIC VEIN TRANSPOSITION;  Surgeon: [name], MD;  Location: [facility];  Service: Vascular;  Laterality: Right;   BIOPSY  08/27/2016   Procedure: BIOPSY;  Surgeon: Danie Binder, MD;  [facility];  Service: Endoscopy;;  duodenum; gastric   BIOPSY  06/27/2019   Procedure: BIOPSY;  Surgeon: Daneil Dolin, MD;  [facility];  Service: Endoscopy;;   BIOPSY  02/09/2022   Procedure: BIOPSY;  Surgeon: Eloise Harman, DO;  [facility];  Service: Endoscopy;;   BREAST CYST EXCISION Right 11/12/2021   Procedure: RIGHT BREAST ABSCESS INCISION AND DRAINAGE;  Surgeon: Rolm Bookbinder, MD;  Location: [place];  Service: General;  Laterality: Right;  LMA   COLONOSCOPY     COLONOSCOPY WITH PROPOFOL N/A 02/09/2022   Procedure: COLONOSCOPY WITH PROPOFOL;  Surgeon: Eloise Harman, DO;  [facility];  Service: Endoscopy;  Laterality: N/A;  7:30am, asa 3, dialysis pt   ESOPHAGOGASTRODUODENOSCOPY N/A 08/27/2016   Dr. Oneida Alar: mild  gastritis. Negative celiac. No obvious source for dyspepsia/diarrhea   ESOPHAGOGASTRODUODENOSCOPY (EGD) WITH PROPOFOL N/A 06/27/2019   rourk: Focal abnormality of the gastric mucosa likely due to trauma (heaving).  Biopsy showed mild gastritis, negative for H. pylori.  Esophageal dilation for history of dysphagia but normal-appearing esophagus.   ESOPHAGOGASTRODUODENOSCOPY (EGD) WITH PROPOFOL N/A 02/09/2022   Procedure: ESOPHAGOGASTRODUODENOSCOPY (EGD) WITH PROPOFOL;  Surgeon: Eloise Harman, DO;  [facility];  Service: Endoscopy;  Laterality: N/A;   IR FLUORO GUIDE CV LINE RIGHT  10/13/2021   IR FLUORO GUIDE CV LINE RIGHT  10/19/2021   IR US GUIDE VASC ACCESS RIGHT  10/13/2021   IRRIGATION AND DEBRIDEMENT ABSCESS N/A 10/04/2021   Procedure: IRRIGATION AND DEBRIDEMENT NECK ABSCESS;  Surgeon: Ralene Ok, MD;  Location: [place];  Service: General;  Laterality: N/A;    Family history:  Family History  Problem Relation Age of Onset   Diabetes Mother        Type II DM   Thyroid disease Mother    Diabetes Maternal Grandmother        Type II DM   Diabetes Cousin        Type II DM   Colon cancer Neg Hx    Colon polyps Neg Hx     Social history: Lives in a home with carpeting with electric heating and fan cooling.  Concerned for leak under kitchen sink for water damage/mildew.  Will see a few roaches in the home. Denies smoking history.    Medication List: Current Outpatient Medications  Medication Sig Dispense Refill   acetaminophen (TYLENOL) 325 MG tablet Take by mouth.     B Complex-C-Folic Acid (DIALYVITE Q000111Q) 0.8 MG TABS Take 1 tablet by mouth daily.     Blood Glucose Monitoring Suppl (ACCU-CHEK GUIDE ME) w/Device KIT 1 Piece by Does not apply route as directed. 1 kit 0   cetirizine (ZYRTEC) 5 MG tablet 1/2 tab once daily 30 tablet 5   Fluticasone Furoate (ARNUITY ELLIPTA) 100 MCG/ACT AEPB Inhale 1 Dose into the lungs daily. 30 each 5   glucose blood (ACCU-CHEK GUIDE)  test strip Use to monitor glucose 4 times a day as instructed 150 each 2   Insulin Glargine (BASAGLAR KWIKPEN) 100 UNIT/ML Inject 18 Units into the skin at bedtime. 15 mL 2   Insulin Pen Needle 31G X 4 MM MISC Use as directed to administer insulin. 100 each 1   Methoxy PEG-Epoetin Beta (MIRCERA IJ) Mircera     Metoprolol Succinate 50 MG CS24 Take 50 mg by mouth at bedtime.     Olopatadine HCl (PATADAY) 0.2 % SOLN Place 1 drop into both eyes daily. 2.5 mL 5   ondansetron (ZOFRAN-ODT) 4 MG disintegrating tablet 4mg  ODT q4 hours prn nausea/vomit (Patient taking differently: Take 4 mg by mouth every 4 (  four) hours as needed for nausea or vomiting. 4mg  ODT q4 hours prn nausea/vomit) 10 tablet 0   pantoprazole (PROTONIX) 40 MG tablet Take 1 tablet (40 mg total) by mouth daily before breakfast. 90 tablet 3   rosuvastatin (CRESTOR) 10 MG tablet Take 1 tablet (10 mg total) by mouth daily. 90 tablet 3   sucroferric oxyhydroxide (VELPHORO) 500 MG chewable tablet Chew 500 mg by mouth 3 (three) times daily with meals.     Vitamin D, Ergocalciferol, (DRISDOL) 1.25 MG (50000 UNIT) CAPS capsule Take 50,000 Units by mouth every 7 (seven) days.     albuterol (VENTOLIN HFA) 108 (90 Base) MCG/ACT inhaler Inhale into the lungs. 18 g 1   Continuous Blood Gluc Receiver (FREESTYLE LIBRE 2 READER) DEVI As directed 1 each 0   Continuous Blood Gluc Sensor (FREESTYLE LIBRE 2 SENSOR) MISC 1 Piece by Does not apply route every 14 (fourteen) days. 2 each 3   prednisoLONE acetate (PRED FORTE) 1 % ophthalmic suspension Place 1 drop into the left eye 4 (four) times daily. (Patient not taking: Reported on 08/05/2022)     No current facility-administered medications for this visit.    Known medication allergies: No Known Allergies   Physical examination: Blood pressure (!) 150/98, pulse 94, temperature 98 F (36.7 C), resp. rate 16, height 5\' 9"  (1.753 m), weight 125 lb 9.6 oz (57 kg), SpO2 100 %.  General: Alert,  interactive, in no acute distress. HEENT: PERRLA, TMs pearly gray, turbinates moderately edematous with clear discharge, post-pharynx non erythematous. Neck: Supple without lymphadenopathy. Lungs: Clear to auscultation without wheezing, rhonchi or rales. {no increased work of breathing. CV: Normal S1, S2 without murmurs. Abdomen: Nondistended, nontender. Skin: Warm and dry, without lesions or rashes. Extremities:  No clubbing, cyanosis or edema. Neuro:   Grossly intact.  Diagnositics/Labs:  Spirometry: FEV1: 1.67L 51%, FVC: 1.78L 46% predicted.  Reduced FEV1 and FVC. Adequate coaching.   Allergy testing:   Airborne Adult Perc - 08/05/22 1539     Time Antigen Placed 1544    Allergen Manufacturer Lavella Hammock    Location Back    Number of Test 57    1. Control-Buffer 50% Glycerol Negative    2. Control-Histamine 1 mg/ml 2+    3. Albumin saline Negative    4. [place] Negative    5. [place] Negative    6. [name] Negative    7. [place] Blue Negative    10. Sweet Vernal Negative    11. Timothy Negative    12. Cocklebur Negative    13. Burweed Marshelder Negative    14. Ragweed, short Negative    15. Ragweed, Giant Negative    16. Plantain,  English Negative    17. Lamb's Quarters Negative    18. Sheep Sorrell Negative    19. Rough Pigweed Negative    20. Marsh Elder, Rough Negative    21. Mugwort, Common Negative    22. Ash mix Negative    23. Birch mix Negative    24. Beech American Negative    25. Box, Elder Negative    26. Cedar, red Negative    27. Cottonwood, [place] Negative    28. Elm mix Negative    29. Hickory Negative    30. Maple mix Negative    31. Oak, [place] mix Negative    32. Pecan Pollen Negative    33. Pine mix Negative    34. Sycamore Eastern Negative    35. Walnut, Black Pollen Negative  36. Alternaria alternata Negative    37. Cladosporium Herbarum Negative    38. Aspergillus mix Negative    39. Penicillium mix Negative    40. Bipolaris sorokiniana  (Helminthosporium) Negative    41. Drechslera spicifera (Curvularia) Negative    42. Mucor plumbeus Negative    43. Fusarium moniliforme Negative    44. Aureobasidium pullulans (pullulara) Negative    45. Rhizopus oryzae Negative    46. Botrytis cinera Negative    47. Epicoccum nigrum Negative    48. Phoma betae Negative    49. Candida Albicans Negative    50. Trichophyton mentagrophytes Negative    51. Mite, D Farinae  5,000 AU/ml 2+    52. Mite, D Pteronyssinus  5,000 AU/ml 2+    53. Cat Hair 10,000 BAU/ml Negative    54.  Dog Epithelia Negative    55. Mixed Feathers Negative    56. Horse Epithelia Negative    57. Cockroach, German 4+    58. Mouse Negative    59. Tobacco Leaf Negative             Food Perc - 08/05/22 1540       Test Information   Time Antigen Placed 1545    Allergen Manufacturer Lavella Hammock    Location Back    Number of allergen test 10      Food   1. Peanut Negative    2. Soybean food Negative    3. Wheat, whole Negative    4. Sesame Negative    5. Milk, cow Negative    6. Egg White, chicken Negative    7. Casein Negative    8. Shellfish mix Negative    9. Fish mix Negative    10. Cashew Negative             Allergy testing results were read and interpreted by provider, documented by clinical staff.   Assessment and plan: Allergic rhinitis with conjunctivitis - Testing today showed: dust mites and cockroach. - Copy of test results provided.  - Avoidance measures provided. - Start taking:  Zyrtec (cetirizine) 5mg  (1/2 tab) once daily.  Can ok with your nephrologist.  5mg  dose is half standard dosing and the recommended dosing for intermittent dialysis.  Pataday  1 drop each eye daily as needed for itchy/watery/red eyes  Reactive airway, moderate persistent - Lung function testing today is low for age - Daily controller medication(s): Arnuity 113mcg one puff once daily - Prior to physical activity: albuterol 2 puffs 10-15 minutes before  physical activity. - Rescue medications: albuterol 2 puffs every 4-6 hours as needed  - Breathing control goals:  * Full participation in all desired activities (may need albuterol before activity) * Albuterol use two time or less a week on average (not counting use with activity) * Cough interfering with sleep two time or less a month * Oral steroids no more than once a year * No hospitalizations  - Food allergy testing is negative thus at this time not concerned for food allergy  Follow-up in 3-4 months or sooner if needed  I appreciate the opportunity to take part in Gabrial's care. Please do not hesitate to contact me with questions.  Sincerely,   Prudy Feeler, MD Allergy/Immunology [facility]

## 2022-08-05 NOTE — Patient Instructions (Addendum)
-   Testing today showed: dust mites and cockroach. - Copy of test results provided.  - Avoidance measures provided. - Start taking:  Zyrtec (cetirizine) 5mg  (1/2 tab) once daily.  Can ok with your nephrologist.  5mg  dose is half standard dosing and the recommended dosing for intermittent dialysis.  Pataday  1 drop each eye daily as needed for itchy/watery/red eyes  - Lung function testing today is low for age - Daily controller medication(s): Arnuity 154mcg one puff once daily - Prior to physical activity: albuterol 2 puffs 10-15 minutes before physical activity. - Rescue medications: albuterol 2 puffs every 4-6 hours as needed  - Breathing control goals:  * Full participation in all desired activities (may need albuterol before activity) * Albuterol use two time or less a week on average (not counting use with activity) * Cough interfering with sleep two time or less a month * Oral steroids no more than once a year * No hospitalizations  - Food allergy testing is negative thus at this time not concerned for food allergy  Follow-up in 3-4 months or sooner if needed

## 2022-08-07 ENCOUNTER — Encounter (HOSPITAL_COMMUNITY): Payer: Self-pay | Admitting: Hematology

## 2022-08-09 ENCOUNTER — Encounter (HOSPITAL_COMMUNITY): Payer: Self-pay | Admitting: Hematology

## 2022-08-20 ENCOUNTER — Encounter (HOSPITAL_COMMUNITY): Payer: Self-pay | Admitting: Hematology

## 2022-08-20 ENCOUNTER — Other Ambulatory Visit (HOSPITAL_COMMUNITY): Payer: Self-pay

## 2022-08-23 ENCOUNTER — Encounter (HOSPITAL_COMMUNITY): Payer: Self-pay | Admitting: Hematology

## 2022-08-26 ENCOUNTER — Telehealth: Payer: Self-pay | Admitting: "Endocrinology

## 2022-08-26 ENCOUNTER — Ambulatory Visit (INDEPENDENT_AMBULATORY_CARE_PROVIDER_SITE_OTHER): Payer: Medicare Other | Admitting: "Endocrinology

## 2022-08-26 ENCOUNTER — Encounter: Payer: Self-pay | Admitting: "Endocrinology

## 2022-08-26 ENCOUNTER — Encounter (HOSPITAL_COMMUNITY): Payer: Self-pay | Admitting: Hematology

## 2022-08-26 VITALS — BP 138/86 | HR 92 | Ht 69.0 in | Wt 123.0 lb

## 2022-08-26 DIAGNOSIS — E559 Vitamin D deficiency, unspecified: Secondary | ICD-10-CM | POA: Diagnosis not present

## 2022-08-26 DIAGNOSIS — Z91199 Patient's noncompliance with other medical treatment and regimen due to unspecified reason: Secondary | ICD-10-CM

## 2022-08-26 DIAGNOSIS — E782 Mixed hyperlipidemia: Secondary | ICD-10-CM

## 2022-08-26 DIAGNOSIS — E1022 Type 1 diabetes mellitus with diabetic chronic kidney disease: Secondary | ICD-10-CM | POA: Diagnosis not present

## 2022-08-26 DIAGNOSIS — N186 End stage renal disease: Secondary | ICD-10-CM

## 2022-08-26 DIAGNOSIS — E1065 Type 1 diabetes mellitus with hyperglycemia: Secondary | ICD-10-CM

## 2022-08-26 MED ORDER — FIASP FLEXTOUCH 100 UNIT/ML ~~LOC~~ SOPN: 5.0000 [IU] | PEN_INJECTOR | Freq: Three times a day (TID) | SUBCUTANEOUS | 1 refills | Status: DC

## 2022-08-26 MED ORDER — BASAGLAR KWIKPEN 100 UNIT/ML ~~LOC~~ SOPN: 16.0000 [IU] | PEN_INJECTOR | Freq: Every day | SUBCUTANEOUS | 1 refills | Status: DC

## 2022-08-26 MED ORDER — DEXCOM G7 SENSOR MISC: 2 refills | Status: DC

## 2022-08-26 MED ORDER — FREESTYLE LIBRE 3 READER DEVI: 1.0000 | Freq: Once | 0 refills | Status: DC | PRN

## 2022-08-26 MED ORDER — FREESTYLE LIBRE 3 SENSOR MISC: 1.0000 | 2 refills | Status: DC

## 2022-08-26 MED ORDER — INSULIN LISPRO (1 UNIT DIAL) 100 UNIT/ML (KWIKPEN): 5.0000 [IU] | PEN_INJECTOR | Freq: Three times a day (TID) | SUBCUTANEOUS | 1 refills | Status: DC

## 2022-08-26 MED ORDER — DEXCOM G7 RECEIVER DEVI: 0 refills | Status: DC

## 2022-08-26 NOTE — Telephone Encounter (Signed)
Pt said can you send in a Libre 2. Right now the insurance will not cover Dexcom. Also, the humalog is not covered. Can you switch it

## 2022-08-26 NOTE — Progress Notes (Signed)
08/26/2022, 4:39 PM  Endocrinology follow-up note   Subjective:    Patient ID: Lindsey Murillo, female    DOB: [birth year].  Lindsey Murillo is being seen in consultation for management of currently uncontrolled symptomatic diabetes requested by  [name], DO.   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 10/21/2021   Diabetic autonomic neuropathy    Diabetic peripheral neuropathy    Environmental allergies    Goiter    History of blood transfusion    Hypertension    no meds currently   Hypoglycemia associated with diabetes    Musculoskeletal pain 04/20/2022   Tachycardia    Thyroiditis, autoimmune    Type 1 diabetes mellitus in patient age 49-19 years with HbA1C goal below 7.5     Past Surgical History:  Procedure Laterality Date   AV FISTULA PLACEMENT Right 12/08/2021   Procedure: ARTERIOVENOUS  FISTULA CREATION VERSUS GRAFT;  Surgeon: Larina Earthly, MD;  Location: [facility];  Service: Vascular;  Laterality: Right;   BASCILIC VEIN TRANSPOSITION Right 01/12/2022   Procedure: RIGHT ARM SECOND STAGE BASILIC VEIN TRANSPOSITION;  Surgeon: Larina Earthly, MD;  Location: [facility];  Service: Vascular;  Laterality: Right;   BIOPSY  08/27/2016   Procedure: BIOPSY;  Surgeon: West Bali, MD;  Location: [facility];  Service: Endoscopy;;  duodenum; gastric   BIOPSY  06/27/2019   Procedure: BIOPSY;  Surgeon: Corbin Ade, MD;  Location: [facility];  Service: Endoscopy;;   BIOPSY  02/09/2022   Procedure: BIOPSY;  Surgeon: Lanelle Bal, DO;  Location: [facility];  Service: Endoscopy;;   BREAST CYST EXCISION Right 11/12/2021   Procedure: RIGHT BREAST  ABSCESS INCISION AND DRAINAGE;  Surgeon: Emelia Loron, MD;  Location: [facility] OR;  Service: General;  Laterality: Right;  LMA   COLONOSCOPY     COLONOSCOPY WITH PROPOFOL N/A 02/09/2022   Procedure: COLONOSCOPY WITH PROPOFOL;  Surgeon: Lanelle Bal, DO;  Location: [facility];  Service: Endoscopy;  Laterality: N/A;  7:30am, asa 3, dialysis pt   ESOPHAGOGASTRODUODENOSCOPY N/A 08/27/2016   Dr. Darrick Penna: mild gastritis. Negative celiac. No obvious source for dyspepsia/diarrhea   ESOPHAGOGASTRODUODENOSCOPY (EGD) WITH PROPOFOL N/A 06/27/2019   rourk: Focal abnormality of the gastric mucosa likely due to trauma (heaving).  Biopsy showed mild gastritis, negative for H. pylori.  Esophageal dilation for history of dysphagia but normal-appearing esophagus.   ESOPHAGOGASTRODUODENOSCOPY (EGD) WITH PROPOFOL N/A 02/09/2022   Procedure: ESOPHAGOGASTRODUODENOSCOPY (EGD) WITH PROPOFOL;  Surgeon: Lanelle Bal, DO;  Location: [facility];  Service: Endoscopy;  Laterality: N/A;   IR FLUORO GUIDE CV LINE RIGHT  10/13/2021   IR FLUORO GUIDE CV LINE RIGHT  10/19/2021   IR US GUIDE VASC ACCESS RIGHT  10/13/2021   IRRIGATION AND DEBRIDEMENT ABSCESS N/A 10/04/2021   Procedure: IRRIGATION AND DEBRIDEMENT NECK ABSCESS;  Surgeon: Axel Filler, MD;  Location: [facility] OR;  Service: General;  Laterality: N/A;    Social History   Socioeconomic History   Marital status: Single    Spouse name: Not on file   Number of children: 0   Years of education: Not on file   Highest education level: High school graduate  Occupational History   Occupation: unemployed  Tobacco Use   Smoking status: Never    Passive exposure: Never   Smokeless tobacco: Never  Vaping Use   Vaping Use: Never used  Substance and Sexual Activity   Alcohol use: No   Drug use: No   Sexual activity: Not Currently    Birth control/protection: Abstinence, None  Other Topics Concern   Not on file  Social History Narrative   Not on file   Social  Determinants of Health   Financial Resource Strain: Not on file  Food Insecurity: No Food Insecurity (07/02/2019)   Hunger Vital Sign    Worried About Running Out of Food in the Last Year: Never true    Ran Out of Food in the Last Year: Never true  Transportation Needs: No Transportation Needs (07/02/2019)   PRAPARE - Administrator, Civil Service (Medical): No    Lack of Transportation (Non-Medical): No  Physical Activity: Not on file  Stress: Not on file  Social Connections: Moderately Integrated (07/02/2019)   Social Connection and Isolation Panel [NHANES]    Frequency of Communication with Friends and Family: More than three times a week    Frequency of Social Gatherings with Friends and Family: More than three times a week    Attends Religious Services: 1 to 4 times per year    Active Member of [employer] or Organizations: Yes    Attends Banker Meetings: 1 to 4 times per year    Marital Status: Never married    Family History  Problem Relation Age of Onset   Diabetes Mother        Type II DM   Thyroid disease Mother    Diabetes Maternal Grandmother        Type II DM   Diabetes Cousin        Type II DM   Colon cancer Neg Hx    Colon polyps Neg Hx     Outpatient Encounter Medications as of 08/26/2022  Medication Sig   Continuous Glucose Receiver (FREESTYLE LIBRE 3 READER) DEVI 1 Piece by Does not apply route once as needed for up to 1 dose.   Continuous Glucose Sensor (FREESTYLE LIBRE 3 SENSOR) MISC 1 Piece by Does not apply route every 14 (fourteen) days. Place 1 sensor on the skin every 14 days. Use to check glucose continuously   insulin aspart (FIASP FLEXTOUCH) 100 UNIT/ML FlexTouch Pen Inject 5-7 Units into the skin 3 (three) times daily before meals.   [DISCONTINUED] Continuous Glucose Receiver (DEXCOM G7 RECEIVER) DEVI Use to monitor BG continuously   [DISCONTINUED] Continuous Glucose Sensor (DEXCOM G7 SENSOR) MISC Change sensor every 10 days    [DISCONTINUED] insulin lispro (HUMALOG KWIKPEN) 100 UNIT/ML KwikPen Inject 5-7 Units into the skin 3 (three) times daily before meals.   acetaminophen (TYLENOL) 325 MG tablet Take by mouth.   albuterol (VENTOLIN HFA) 108 (90 Base) MCG/ACT inhaler Inhale into the lungs.   Blood Glucose Monitoring Suppl (ACCU-CHEK GUIDE ME) w/Device KIT 1 Piece by Does not apply route as directed.   cetirizine (ZYRTEC) 5 MG tablet 1/2 tab once daily  Fluticasone Furoate (ARNUITY ELLIPTA) 100 MCG/ACT AEPB Inhale 1 Dose into the lungs daily.   glucose blood (ACCU-CHEK GUIDE) test strip Use to monitor glucose 4 times a day as instructed   Insulin Glargine (BASAGLAR KWIKPEN) 100 UNIT/ML Inject 16 Units into the skin at bedtime.   Insulin Pen Needle 31G X 4 MM MISC Use as directed to administer insulin.   Methoxy PEG-Epoetin Beta (MIRCERA IJ) Mircera   Metoprolol Succinate 50 MG CS24 Take 50 mg by mouth at bedtime.   ondansetron (ZOFRAN-ODT) 4 MG disintegrating tablet  ODT q4 hours prn nausea/vomit (Patient taking differently: Take 4 mg by mouth every 4 (four) hours as needed for nausea or vomiting.  ODT q4 hours prn nausea/vomit)   pantoprazole (PROTONIX) 40 MG tablet Take 1 tablet (40 mg total) by mouth daily before breakfast.   rosuvastatin (CRESTOR) 10 MG tablet Take 1 tablet (10 mg total) by mouth daily.   sucroferric oxyhydroxide (VELPHORO) 500 MG chewable tablet Chew 500 mg by mouth 3 (three) times daily with meals.   Vitamin D, Ergocalciferol, (DRISDOL) 1.25 MG (50000 UNIT) CAPS capsule Take 50,000 Units by mouth every 7 (seven) days.   [DISCONTINUED] B Complex-C-Folic Acid (DIALYVITE 800) 0.8 MG TABS Take 1 tablet by mouth daily.   [DISCONTINUED] Continuous Blood Gluc Receiver (FREESTYLE LIBRE 2 READER) DEVI As directed   [DISCONTINUED] Continuous Blood Gluc Sensor (FREESTYLE LIBRE 2 SENSOR) MISC 1 Piece by Does not apply route every 14 (fourteen) days.   [DISCONTINUED] Insulin Glargine (BASAGLAR KWIKPEN)  100 UNIT/ML Inject 18 Units into the skin at bedtime.   [DISCONTINUED] Olopatadine HCl (PATADAY) 0.2 % SOLN Place 1 drop into both eyes daily.   [DISCONTINUED] prednisoLONE acetate (PRED FORTE) 1 % ophthalmic suspension Place 1 drop into the left eye 4 (four) times daily. (Patient not taking: Reported on 08/05/2022)   No facility-administered encounter medications on file as of 08/26/2022.    ALLERGIES: No Known Allergies  VACCINATION STATUS: Immunization History  Administered Date(s) Administered   Influenza,inj,Quad PF,6+ Mos 03/13/2013, 02/25/2014, 04/09/2016, 01/26/2017, 01/18/2019, 06/15/2021, 02/08/2022   Td 04/15/2017    Diabetes She has type 1 diabetes mellitus. Her disease course has been worsening. There are no hypoglycemic associated symptoms. Associated symptoms include polydipsia and polyuria. There are no hypoglycemic complications. Symptoms are worsening. Diabetic complications include nephropathy and peripheral neuropathy. (Non adherence to medical treatment.) Risk factors for coronary artery disease include diabetes mellitus, dyslipidemia and sedentary lifestyle. Her weight is fluctuating minimally. She is following a generally unhealthy diet. When asked about meal planning, she reported none. She never participates in exercise. (She missed her appointment since September 2023.  Tragically, she presents with no meter nor logs.  Evidently, she lost coverage for a CGM.  Her recent A1c was 10.5% increasing from 8.3% during her last visit.  She is now on hemodialysis.  She denies hypoglycemia.  She remains only on Basaglar 18 units nightly.) An ACE inhibitor/angiotensin II receptor blocker is being taken.  Hyperlipidemia This is a chronic problem. Exacerbating diseases include diabetes. Current antihyperlipidemic treatment includes statins. Risk factors for coronary artery disease include dyslipidemia, diabetes mellitus, hypertension and a sedentary lifestyle.  Hypertension This is a  chronic problem. Risk factors for coronary artery disease include dyslipidemia and diabetes mellitus. Past treatments include ACE inhibitors.     Review of Systems  Endocrine: Positive for polydipsia and polyuria.    Objective:       08/26/2022    2:23 PM 08/26/2022    2:12 PM  08/05/2022    2:46 PM  Vitals with BMI  Height  5\' 9"  5\' 9"   Weight  123 lbs 125 lbs 10 oz  BMI  18.16 18.54  Systolic 138 142 161  Diastolic 86 66 98  Pulse  92 94    BP 138/86 Comment: L arm with manuel cuff  Pulse 92   Ht 5\' 9"  (1.753 m)   Wt 123 lb (55.8 kg)   BMI 18.16 kg/m   Wt Readings from Last 3 Encounters:  08/26/22 123 lb (55.8 kg)  08/05/22 125 lb 9.6 oz (57 kg)  07/20/22 120 lb 3.2 oz (54.5 kg)     Physical Exam    CMP ( most recent) CMP     Component Value Date/Time   NA 129 (L) 06/12/2022 1401   NA 132 (L) 09/24/2021 1453   K 4.7 06/12/2022 1401   CL 92 (L) 06/12/2022 1401   CO2 23 06/12/2022 1401   GLUCOSE 225 (H) 06/12/2022 1401   BUN 33 (H) 06/12/2022 1401   BUN 36 (H) 09/24/2021 1453   CREATININE 5.14 (H) 06/12/2022 1401   CREATININE 0.98 08/29/2019 1637   CALCIUM 8.9 06/12/2022 1401   PROT 7.1 06/12/2022 1620   PROT 5.5 (L) 09/24/2021 1453   ALBUMIN 3.4 (L) 06/12/2022 1620   ALBUMIN 2.1 (L) 09/24/2021 1453   AST 38 06/12/2022 1620   ALT 79 (H) 06/12/2022 1620   ALKPHOS 288 (H) 06/12/2022 1620   BILITOT 0.7 06/12/2022 1620   BILITOT <0.2 09/24/2021 1453   GFRNONAA 11 (L) 06/12/2022 1401   GFRAA >60 10/27/2019 2021     Diabetic Labs (most recent): Lab Results  Component Value Date   HGBA1C 10.5 (H) 07/20/2022   HGBA1C 8.3 (A) 01/21/2022   HGBA1C 9.8 (H) 09/24/2021   MICROALBUR 1,195.2 (H) 06/13/2021   MICROALBUR 0.50 11/28/2012   MICROALBUR 0.50 07/20/2012     Lipid Panel ( most recent) Lipid Panel     Component Value Date/Time   CHOL 190 07/20/2022 1440   TRIG 500 (H) 07/20/2022 1440   HDL 73 07/20/2022 1440   CHOLHDL 2.6 07/20/2022 1440    CHOLHDL 2.0 11/05/2015 1252   VLDL 12 11/05/2015 1252   LDLCALC 46 07/20/2022 1440   LABVLDL 71 (H) 07/20/2022 1440      Lab Results  Component Value Date   TSH 0.975 10/03/2021   TSH 2.520 05/26/2021   TSH 1.802 05/14/2021   TSH 0.342 (L) 05/27/2018   TSH 0.55 11/05/2015   TSH 1.175 11/28/2012   TSH 0.706 07/20/2012   TSH 1.008 08/26/2011   FREET4 1.4 11/05/2015   FREET4 1.26 11/28/2012   FREET4 1.44 07/20/2012   FREET4 1.23 08/26/2011      Assessment & Plan:   1. Uncontrolled type 1 diabetes mellitus with ESRD    Lindsey Murillo has currently uncontrolled symptomatic type (?1) DM since  27 years of age.  She missed her appointment since September 2023.  Tragically, she presents with no meter nor logs.  Evidently, she lost coverage for a CGM.  Her recent A1c was 10.5% increasing from 8.3% during her last visit.  She is now on hemodialysis.  She denies hypoglycemia.  She remains only on Basaglar 18 units nightly.  She presents with her meter showing average blood glucose of 159-195 for the last 30 days.  In the interim, she has lower her Semglee to 12 units nightly, continue to hold her prandial insulin.    - I had  a long discussion with her about the progressive nature of diabetes and the pathology behind its complications. -her diabetes is complicated by peripheral neuropathy, ESRD, noncompliance/nonadherence, peripheral ? Lymphedema she remains at a high risk for more acute and chronic complications which include CAD, CVA, CKD, retinopathy, and neuropathy. These are all discussed in detail with her.  - I discussed all available options of managing her diabetes including de-escalation of medications. I have counseled her on diet  and weight management  by adopting a Whole Food , Plant Predominant  ( WFPP) nutrition as recommended by [employer] of Lifestyle Medicine. Patient is encouraged to switch to  unprocessed or minimally processed  complex starch, adequate  protein intake (mainly plant source), minimal liquid fat ( mainly vegetable oils), plenty of fruits, and vegetables. -  she is advised to stick to a routine mealtimes to eat 3 complete meals a day and snack only when necessary ( to snack only to correct hypoglycemia BG <70 day time or <100 at night).   - she acknowledges that there is a room for improvement in her food and drink choices. - Further Specific Suggestion is made for her to avoid simple carbohydrates  from her diet including Cakes, Sweet Desserts, Ice Cream, Soda (diet and regular), Sweet Tea, Candies, Chips, Cookies, Store Bought Juices, Alcohol ,  Artificial Sweeteners,  Coffee Creamer, and "Sugar-free" Products. This will help patient to have more stable blood glucose profile and potentially avoid unintended weight gain. - I have approached her with the following plan to manage  her diabetes and patient agrees:   Her lack of  engagement for proper monitoring and proper follow-up makes it difficult to optimize her diabetes care.  She clearly needs multiple daily injections of insulin involving basal/bolus.    I discussed and prescribed the Dexcom CGM device for her.  She previously had freestyle libre device.  She could not keep it on her skin, and finally decided not to wear it. In the meantime, she is approached to start monitoring blood glucose 4 times a day-before meals and at bedtime.    Accordingly, I have advised her to lower her Basaglar to 16 units nightly, discussed and initiated Fiasp 5 units 3 times daily AC for Premeal blood glucose readings above 90 mg/day.  She is also given patient specific correction dose from [place].     She is encouraged to call clinic for blood glucose readings less than 70 or greater than 200 mg per DL x3.    - she is warned not to take insulin without proper monitoring per orders. - She does not have a suitable , non insulin option to treat her diabetes.  - Specific targets for  A1c;  LDL, HDL,   and Triglycerides were discussed with the patient.  2) Blood Pressure /Hypertension:  -Her blood pressure is controlled to target. .  she is advised to continue her current medications including  Lisinopril  p.o. daily with breakfast .  3) Lipids/Hyperlipidemia:   Review of her recent lipid panel showed improving LDL to 46.  This is dropping from 109.    she  is advised to continue Lipitor 10 mg p.o. daily at bedtime.    Side effects and precautions discussed with her.  4)  Weight/Diet:  Body mass index is 18.16 kg/m.   She is not a candidate for weight loss.  She was previously given the lifestyle medicine package, struggling to implement.  5) Chronic Care/Health Maintenance:  -she  is  on ACEI/ARB and Statin medications and  is encouraged to initiate and continue to follow up with Ophthalmology, Dentist,  Podiatrist at least yearly or according to recommendations, and advised to  stay away from smoking. I have recommended yearly flu vaccine and pneumonia vaccine at least every 5 years; moderate intensity exercise for up to 150 minutes weekly; and  sleep for 7- 9 hours a day.  - she is  advised to maintain close follow up with [name], DO for primary care needs, as well as her other providers for optimal and coordinated care.  I spent  42  minutes in the care of the patient today including review of labs from CMP, Lipids, Thyroid Function, Hematology (current and previous including abstractions from other facilities); face-to-face time discussing  her blood glucose readings/logs, discussing hypoglycemia and hyperglycemia episodes and symptoms, medications doses, her options of short and long term treatment based on the latest standards of care / guidelines;  discussion about incorporating lifestyle medicine;  and documenting the encounter. Risk reduction counseling performed per USPSTF guidelines to reduce cardiovascular risk factors.     Please refer to Patient Instructions for Blood  Glucose Monitoring and Insulin/Medications Dosing Guide"  in media tab for additional information. Please  also refer to " Patient Self Inventory" in the Media  tab for reviewed elements of pertinent patient history.  [employer] participated in the discussions, expressed understanding, and voiced agreement with the above plans.  All questions were answered to her satisfaction. she is encouraged to contact clinic should she have any questions or concerns prior to her return visit.     Follow up plan: - Return in about 2 weeks (around 09/09/2022) for F/U with Meter/CGM Megan Salon Only - no Labs.  Marquis Lunch, MD [facility] [address] [place], [place] [address] Phone: [phone]  Fax: [phone]    08/26/2022, 4:40 PM  This note was partially dictated with voice recognition software. Similar sounding words can be transcribed inadequately or may not  be corrected upon review.

## 2022-09-02 ENCOUNTER — Encounter (HOSPITAL_COMMUNITY): Payer: Self-pay | Admitting: Hematology

## 2022-09-07 ENCOUNTER — Ambulatory Visit (INDEPENDENT_AMBULATORY_CARE_PROVIDER_SITE_OTHER): Payer: Medicare Other

## 2022-09-07 VITALS — Ht 69.0 in | Wt 123.0 lb

## 2022-09-07 DIAGNOSIS — E1065 Type 1 diabetes mellitus with hyperglycemia: Secondary | ICD-10-CM

## 2022-09-07 NOTE — Progress Notes (Signed)
Pt brought in her new Dexcom G7 today. Went over instructions on how to use the G7 app on her phone to test and linked it to our clinic. Also went over G7 sensor instructions for application and placement. Pt voiced understanding but told her to call with any questions/concerns.

## 2022-09-08 DIAGNOSIS — D631 Anemia in chronic kidney disease: Secondary | ICD-10-CM | POA: Diagnosis not present

## 2022-09-08 DIAGNOSIS — E039 Hypothyroidism, unspecified: Secondary | ICD-10-CM | POA: Diagnosis not present

## 2022-09-08 DIAGNOSIS — N2581 Secondary hyperparathyroidism of renal origin: Secondary | ICD-10-CM | POA: Diagnosis not present

## 2022-09-08 DIAGNOSIS — E1040 Type 1 diabetes mellitus with diabetic neuropathy, unspecified: Secondary | ICD-10-CM | POA: Diagnosis not present

## 2022-09-08 DIAGNOSIS — Z992 Dependence on renal dialysis: Secondary | ICD-10-CM | POA: Diagnosis not present

## 2022-09-08 DIAGNOSIS — D509 Iron deficiency anemia, unspecified: Secondary | ICD-10-CM | POA: Diagnosis not present

## 2022-09-08 DIAGNOSIS — L299 Pruritus, unspecified: Secondary | ICD-10-CM | POA: Diagnosis not present

## 2022-09-08 DIAGNOSIS — N186 End stage renal disease: Secondary | ICD-10-CM | POA: Diagnosis not present

## 2022-09-09 DIAGNOSIS — E1129 Type 2 diabetes mellitus with other diabetic kidney complication: Secondary | ICD-10-CM | POA: Diagnosis not present

## 2022-09-09 DIAGNOSIS — Z7682 Awaiting organ transplant status: Secondary | ICD-10-CM | POA: Diagnosis not present

## 2022-09-09 DIAGNOSIS — Z1159 Encounter for screening for other viral diseases: Secondary | ICD-10-CM | POA: Diagnosis not present

## 2022-09-09 DIAGNOSIS — Z7289 Other problems related to lifestyle: Secondary | ICD-10-CM | POA: Diagnosis not present

## 2022-09-09 DIAGNOSIS — Z794 Long term (current) use of insulin: Secondary | ICD-10-CM | POA: Diagnosis not present

## 2022-09-09 DIAGNOSIS — N289 Disorder of kidney and ureter, unspecified: Secondary | ICD-10-CM | POA: Diagnosis not present

## 2022-09-10 ENCOUNTER — Telehealth: Payer: Self-pay

## 2022-09-10 DIAGNOSIS — E1040 Type 1 diabetes mellitus with diabetic neuropathy, unspecified: Secondary | ICD-10-CM | POA: Diagnosis not present

## 2022-09-10 DIAGNOSIS — L299 Pruritus, unspecified: Secondary | ICD-10-CM | POA: Diagnosis not present

## 2022-09-10 DIAGNOSIS — E039 Hypothyroidism, unspecified: Secondary | ICD-10-CM | POA: Diagnosis not present

## 2022-09-10 DIAGNOSIS — Z992 Dependence on renal dialysis: Secondary | ICD-10-CM | POA: Diagnosis not present

## 2022-09-10 DIAGNOSIS — N2581 Secondary hyperparathyroidism of renal origin: Secondary | ICD-10-CM | POA: Diagnosis not present

## 2022-09-10 DIAGNOSIS — D631 Anemia in chronic kidney disease: Secondary | ICD-10-CM | POA: Diagnosis not present

## 2022-09-10 DIAGNOSIS — D509 Iron deficiency anemia, unspecified: Secondary | ICD-10-CM | POA: Diagnosis not present

## 2022-09-10 DIAGNOSIS — N186 End stage renal disease: Secondary | ICD-10-CM | POA: Diagnosis not present

## 2022-09-10 NOTE — Telephone Encounter (Signed)
Pt is wanting a referral for a stress test on her kidney?   [phone]

## 2022-09-13 ENCOUNTER — Encounter (HOSPITAL_COMMUNITY): Payer: Self-pay | Admitting: Hematology

## 2022-09-13 DIAGNOSIS — L299 Pruritus, unspecified: Secondary | ICD-10-CM | POA: Diagnosis not present

## 2022-09-13 DIAGNOSIS — N2581 Secondary hyperparathyroidism of renal origin: Secondary | ICD-10-CM | POA: Diagnosis not present

## 2022-09-13 DIAGNOSIS — D631 Anemia in chronic kidney disease: Secondary | ICD-10-CM | POA: Diagnosis not present

## 2022-09-13 DIAGNOSIS — E1040 Type 1 diabetes mellitus with diabetic neuropathy, unspecified: Secondary | ICD-10-CM | POA: Diagnosis not present

## 2022-09-13 DIAGNOSIS — Z992 Dependence on renal dialysis: Secondary | ICD-10-CM | POA: Diagnosis not present

## 2022-09-13 DIAGNOSIS — N186 End stage renal disease: Secondary | ICD-10-CM | POA: Diagnosis not present

## 2022-09-13 DIAGNOSIS — D509 Iron deficiency anemia, unspecified: Secondary | ICD-10-CM | POA: Diagnosis not present

## 2022-09-13 DIAGNOSIS — E039 Hypothyroidism, unspecified: Secondary | ICD-10-CM | POA: Diagnosis not present

## 2022-09-13 NOTE — Telephone Encounter (Signed)
[name], DO     Please contact patient regarding this. I am not sure what is needed.

## 2022-09-13 NOTE — Telephone Encounter (Signed)
Left message to return call 

## 2022-09-13 NOTE — Progress Notes (Signed)
GI Office Note    Referring Provider: [name], DO Primary Care Physician:  [name], DO Primary Gastroenterologist: Hennie Duos. Marletta Lor, DO  Date:  09/16/2022  ID:  Lindsey Murillo, DOB 1995-12-13, MRN [id number]   Chief Complaint   Chief Complaint  Patient presents with   Follow-up    Follow up. No problems    History of Present Illness  Lindsey GOTSCH is a 27 y.o. female with a history of chronic pancreatitis with stable pseudocyst, gastritis, end-stage renal disease on dialysis M, W, F, asthma, type 1 diabetes, HTN, and recent cataract surgery presenting today for follow up.   EGD 03/08/2022: -Gastritis s/p biopsy -Normal duodenum -Biopsy negative for H. pylori, slight chronic inflammation. -Use pantoprazole 40 mg once daily -Avoid NSAIDs.   Colonoscopy 02/09/2022: -Nonbleeding internal hemorrhoids -Exam otherwise normal -Repeat colonoscopy at age 34 or sooner   ED visit 05/10/2022 for diarrhea. Presenting with complaint of diarrhea and nausea.  Was given prescription for Zofran and Cipro.  GI pathogen panel negative.   ED visit 05/27/2022 for nausea/vomiting and back pain. EKG with sinus tach and left atrial enlargement.  Low voltage.  Reportedly missed dialysis session due to dizziness.  Also with low blood sugar at this time which is likely contributing to her nausea as well.  Treated with IV fluids and dextrose.   ED visit 06/12/2022 for weakness and vomiting. Was given GI cocktail and prescription for Zofran to use 4 mg every 8 hours as needed.  Also given 1 L fluids.  EKG with sinus rhythm and left atrial enlargement with incomplete right bundle branch block.  Prolonged QT in precordial leads.  No significant changes since prior EKG on 06/02/2022.  Last office visit 06/17/22. Breakthrough GERD  twice per week. Occasional nausea. No dysphagia. Occasional looser stools. Increase PPI to BID, zofran as needed. Renal and GERD diet advised. CBC in 2 months.    Today:  Mild nausea. No abnormal pain , reflux better on BID PPI. No dysphagia. Energy level is about the same. After dialysis she feels a little tired and sleeps a little more. Appetite is great. Has to eat smaller frequent meals to help with reflux and other issues.   No constipation. Sometimes has diarrhea 1-2 times a week but usually is just about once occurrence and then back to normal.No melena or brbpr. Gets iron sometimes at dialysis.   Still has to get her stress test done an pap smear she will be done with her workup. [facility] wants her A1c to get to 8 or below. May be going to [facility] to be on the list.   Vision is blurry. Left eye is sometimes fine and sometimes not. Right eye is blurry.   Has cataract surgery 6/5 on her right. Previous L eye cataract surgery.   Sees endocrine next week.   Current Outpatient Medications  Medication Sig Dispense Refill   acetaminophen (TYLENOL) 325 MG tablet Take by mouth.     albuterol (VENTOLIN HFA) 108 (90 Base) MCG/ACT inhaler Inhale into the lungs. 18 g 1   Blood Glucose Monitoring Suppl (ACCU-CHEK GUIDE ME) w/Device KIT 1 Piece by Does not apply route as directed. 1 kit 0   cetirizine (ZYRTEC) 5 MG tablet 1/2 tab once daily 30 tablet 5   Continuous Glucose Receiver (FREESTYLE LIBRE 3 READER) DEVI 1 Piece by Does not apply route once as needed for up to 1 dose. 1 each 0   Continuous Glucose Sensor (FREESTYLE  LIBRE 3 SENSOR) MISC 1 Piece by Does not apply route every 14 (fourteen) days. Place 1 sensor on the skin every 14 days. Use to check glucose continuously 2 each 2   Fluticasone Furoate (ARNUITY ELLIPTA) 100 MCG/ACT AEPB Inhale 1 Dose into the lungs daily. 30 each 5   glucose blood (ACCU-CHEK GUIDE) test strip Use to monitor glucose 4 times a day as instructed 150 each 2   insulin aspart (FIASP FLEXTOUCH) 100 UNIT/ML FlexTouch Pen Inject 5-7 Units into the skin 3 (three) times daily before meals. 15 mL 1   Insulin Glargine (BASAGLAR KWIKPEN)  100 UNIT/ML Inject 16 Units into the skin at bedtime. 15 mL 1   Insulin Pen Needle 31G X 4 MM MISC Use as directed to administer insulin. 100 each 1   Methoxy PEG-Epoetin Beta (MIRCERA IJ) Mircera     Metoprolol Succinate 50 MG CS24 Take 50 mg by mouth at bedtime.     ondansetron (ZOFRAN-ODT) 4 MG disintegrating tablet 4mg  ODT q4 hours prn nausea/vomit (Patient taking differently: Take 4 mg by mouth every 4 (four) hours as needed for nausea or vomiting. 4mg  ODT q4 hours prn nausea/vomit) 10 tablet 0   pantoprazole (PROTONIX) 40 MG tablet Take 1 tablet (40 mg total) by mouth daily before breakfast. 90 tablet 3   rosuvastatin (CRESTOR) 10 MG tablet Take 1 tablet (10 mg total) by mouth daily. 90 tablet 3   sucroferric oxyhydroxide (VELPHORO) 500 MG chewable tablet Chew 500 mg by mouth 3 (three) times daily with meals.     Vitamin D, Ergocalciferol, (DRISDOL) 1.25 MG (50000 UNIT) CAPS capsule Take 50,000 Units by mouth every 7 (seven) days.     No current facility-administered medications for this visit.    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 10/21/2021   Diabetic autonomic neuropathy ([name])    Diabetic peripheral neuropathy ([name])    Environmental allergies    Goiter    History of blood transfusion    Hypertension    no meds currently   Hypoglycemia associated with diabetes ([name])    Musculoskeletal pain 04/20/2022   Tachycardia    Thyroiditis, autoimmune    Type 1 diabetes mellitus in patient age 41-19 years with HbA1C goal below 7.5     Past Surgical History:  Procedure Laterality Date   AV FISTULA PLACEMENT Right 12/08/2021   Procedure: ARTERIOVENOUS  FISTULA CREATION VERSUS GRAFT;  Surgeon: Larina Earthly, MD;  Location: [facility];  Service:  Vascular;  Laterality: Right;   BASCILIC VEIN TRANSPOSITION Right 01/12/2022   Procedure: RIGHT ARM SECOND STAGE BASILIC VEIN TRANSPOSITION;  Surgeon: Larina Earthly, MD;  Location: [facility];  Service: Vascular;  Laterality: Right;   BIOPSY  08/27/2016   Procedure: BIOPSY;  Surgeon: West Bali, MD;  Location: [facility];  Service: Endoscopy;;  duodenum; gastric   BIOPSY  06/27/2019   Procedure: BIOPSY;  Surgeon: Corbin Ade, MD;  Location: [facility];  Service: Endoscopy;;   BIOPSY  02/09/2022   Procedure: BIOPSY;  Surgeon: Lanelle Bal, DO;  Location: [facility];  Service: Endoscopy;;   BREAST CYST EXCISION Right 11/12/2021   Procedure: RIGHT BREAST ABSCESS INCISION AND DRAINAGE;  Surgeon: Emelia Loron, MD;  Location: [facility];  Service: General;  Laterality: Right;  LMA   COLONOSCOPY     COLONOSCOPY WITH PROPOFOL N/A 02/09/2022   Procedure: COLONOSCOPY WITH PROPOFOL;  Surgeon: Lanelle Bal, DO;  [facility]: [facility];  Service: Endoscopy;  Laterality: N/A;  7:30am, asa 3, dialysis pt   ESOPHAGOGASTRODUODENOSCOPY N/A 08/27/2016   Dr. Darrick Penna: mild gastritis. Negative celiac. No obvious source for dyspepsia/diarrhea   ESOPHAGOGASTRODUODENOSCOPY (EGD) WITH PROPOFOL N/A 06/27/2019   rourk: Focal abnormality of the gastric mucosa likely due to trauma (heaving).  Biopsy showed mild gastritis, negative for H. pylori.  Esophageal dilation for history of dysphagia but normal-appearing esophagus.   ESOPHAGOGASTRODUODENOSCOPY (EGD) WITH PROPOFOL N/A 02/09/2022   Procedure: ESOPHAGOGASTRODUODENOSCOPY (EGD) WITH PROPOFOL;  Surgeon: Lanelle Bal, DO;  [facility];  Service: Endoscopy;  Laterality: N/A;   IR FLUORO GUIDE CV LINE RIGHT  10/13/2021   IR FLUORO GUIDE CV LINE RIGHT  10/19/2021   IR US GUIDE VASC ACCESS RIGHT  10/13/2021   IRRIGATION AND DEBRIDEMENT ABSCESS N/A 10/04/2021   Procedure: IRRIGATION AND DEBRIDEMENT NECK ABSCESS;  Surgeon: Axel Filler, MD;   Location: [facility];  Service: General;  Laterality: N/A;    Family History  Problem Relation Age of Onset   Diabetes Mother        Type II DM   Thyroid disease Mother    Diabetes Maternal Grandmother        Type II DM   Diabetes Cousin        Type II DM   Colon cancer Neg Hx    Colon polyps Neg Hx     Allergies as of 09/16/2022   (No Known Allergies)    Social History   Socioeconomic History   Marital status: Single    Spouse name: Not on file   Number of children: 0   Years of education: Not on file   Highest education level: High school graduate  Occupational History   Occupation: unemployed  Tobacco Use   Smoking status: Never    Passive exposure: Never   Smokeless tobacco: Never  Vaping Use   Vaping Use: Never used  Substance and Sexual Activity   Alcohol use: No   Drug use: No   Sexual activity: Not Currently    Birth control/protection: Abstinence, None  Other Topics Concern   Not on file  Social History Narrative   Not on file   Social Determinants of Health   Financial Resource Strain: Not on file  Food Insecurity: No Food Insecurity (07/02/2019)   Hunger Vital Sign    Worried About Running Out of Food in the Last Year: Never true    Ran Out of Food in the Last Year: Never true  Transportation Needs: No Transportation Needs (07/02/2019)   PRAPARE - Administrator, Civil Service (Medical): No    Lack of Transportation (Non-Medical): No  Physical Activity: Not on file  Stress: Not on file  Social Connections: Moderately Integrated (07/02/2019)   Social Connection and Isolation Panel [NHANES]    Frequency of Communication with Friends and Family: More than three times a week    Frequency of Social Gatherings with Friends and Family: More than three times a week    Attends Religious Services: 1 to 4 times per year    Active Member of [employer] or Organizations: Yes    Attends Banker Meetings: 1 to 4 times per year    Marital Status:  Never married  Review of Systems   Gen: Denies fever, chills, anorexia. Denies fatigue, weakness, weight loss.  CV: Denies chest pain, palpitations, syncope, peripheral edema, and claudication. Resp: Denies dyspnea at rest, cough, wheezing, coughing up blood, and pleurisy. GI: See HPI Derm: Denies rash, itching, dry skin Psych: Denies depression, anxiety, memory loss, confusion. No homicidal or suicidal ideation.  Heme: Denies bruising, bleeding, and enlarged lymph nodes.   Physical Exam   BP 104/66 (BP Location: Left Arm, Patient Position: Sitting, Cuff Size: Normal)   Pulse 91   Temp 97.6 F (36.4 C) (Temporal)   Ht 5\' 8"  (1.727 m)   Wt 127 lb 3.2 oz (57.7 kg)   SpO2 99%   BMI 19.34 kg/m   General:   Alert and oriented. No distress noted. Pleasant and cooperative.  Head:  Normocephalic and atraumatic. Eyes:  sclera red bilaterally.  Mouth:  Oral mucosa pink and moist. Good dentition. No lesions. Lungs:  Clear to auscultation bilaterally. No wheezes, rales, or rhonchi. No distress.  Heart:  S1, S2 present without murmurs appreciated.  Abdomen:  +BS, soft, non-tender and non-distended. No rebound or guarding. No HSM or masses noted. Rectal: deferred Msk:  weak mildly unsteady gait.  Extremities:  Without edema. Neurologic:  Alert and  oriented x4 Psych:  Alert and cooperative. Normal mood and affect.   Assessment  ALAINNA KARCHER is a 27 y.o. female with a history of chronic pancreatitis with stable pseudocyst, gastritis, end-stage renal disease on dialysis M, W, F, asthma, type 1 diabetes, HTN, and recent cataract surgery presenting today for follow up.  Diarrhea, chronic pancreatitis: No weight loss. Loose stools 1-2 times weekly. Not currently requiring any pancreatic enzymes.   GERD, dysphagia, N/V: Mild nausea. Good appetite.GERD well controlled on PPI BID. Will refill today. Continue Zofran as needed (uses sparingly).  IDA: Fatigue experienced after  dialysis, not worsened, remains at baseline. No melena or brbpr. Hgb 11.7 in February. Will recheck now. Receives iron as needed at dialysis.   PLAN   Continue pantoprazole 40 mg BID. Refilled today. Continue Zofran as needed GERD diet CBC with next blood draw Continue iron with dialysis as needed Follow-up in 6 months    Brooke Bonito, MSN, FNP-BC, AGACNP-BC [facility]

## 2022-09-14 NOTE — Telephone Encounter (Signed)
Patient returned call she was advised by [facility] pcp should order a stress test for her kidney to make sure she can undergo kidney transplant. Patient states she is seen by [facility] in [place].

## 2022-09-15 DIAGNOSIS — Z992 Dependence on renal dialysis: Secondary | ICD-10-CM | POA: Diagnosis not present

## 2022-09-15 DIAGNOSIS — N186 End stage renal disease: Secondary | ICD-10-CM | POA: Diagnosis not present

## 2022-09-15 DIAGNOSIS — N2581 Secondary hyperparathyroidism of renal origin: Secondary | ICD-10-CM | POA: Diagnosis not present

## 2022-09-15 DIAGNOSIS — E1040 Type 1 diabetes mellitus with diabetic neuropathy, unspecified: Secondary | ICD-10-CM | POA: Diagnosis not present

## 2022-09-15 DIAGNOSIS — D509 Iron deficiency anemia, unspecified: Secondary | ICD-10-CM | POA: Diagnosis not present

## 2022-09-15 DIAGNOSIS — L299 Pruritus, unspecified: Secondary | ICD-10-CM | POA: Diagnosis not present

## 2022-09-15 DIAGNOSIS — E039 Hypothyroidism, unspecified: Secondary | ICD-10-CM | POA: Diagnosis not present

## 2022-09-15 DIAGNOSIS — D631 Anemia in chronic kidney disease: Secondary | ICD-10-CM | POA: Diagnosis not present

## 2022-09-15 NOTE — Telephone Encounter (Signed)
Everlene Other G, DO     I am not sure what she is referring to other than a rountine cardiac stress test. Per the EMR, [facility] stated that she is not currently a candidate.

## 2022-09-16 ENCOUNTER — Ambulatory Visit (INDEPENDENT_AMBULATORY_CARE_PROVIDER_SITE_OTHER): Payer: 59 | Admitting: Gastroenterology

## 2022-09-16 ENCOUNTER — Encounter (HOSPITAL_COMMUNITY): Payer: Self-pay | Admitting: Hematology

## 2022-09-16 ENCOUNTER — Ambulatory Visit (INDEPENDENT_AMBULATORY_CARE_PROVIDER_SITE_OTHER): Payer: 59

## 2022-09-16 ENCOUNTER — Encounter: Payer: Self-pay | Admitting: Gastroenterology

## 2022-09-16 VITALS — BP 140/78 | Ht 69.0 in | Wt 123.0 lb

## 2022-09-16 VITALS — BP 104/66 | HR 91 | Temp 97.6°F | Ht 68.0 in | Wt 127.2 lb

## 2022-09-16 DIAGNOSIS — K219 Gastro-esophageal reflux disease without esophagitis: Secondary | ICD-10-CM

## 2022-09-16 DIAGNOSIS — D509 Iron deficiency anemia, unspecified: Secondary | ICD-10-CM | POA: Diagnosis not present

## 2022-09-16 DIAGNOSIS — R195 Other fecal abnormalities: Secondary | ICD-10-CM | POA: Diagnosis not present

## 2022-09-16 DIAGNOSIS — K861 Other chronic pancreatitis: Secondary | ICD-10-CM | POA: Diagnosis not present

## 2022-09-16 DIAGNOSIS — R112 Nausea with vomiting, unspecified: Secondary | ICD-10-CM | POA: Diagnosis not present

## 2022-09-16 DIAGNOSIS — E1065 Type 1 diabetes mellitus with hyperglycemia: Secondary | ICD-10-CM

## 2022-09-16 MED ORDER — PANTOPRAZOLE SODIUM 40 MG PO TBEC: 40.0000 mg | DELAYED_RELEASE_TABLET | Freq: Two times a day (BID) | ORAL | 3 refills | Status: DC

## 2022-09-16 NOTE — Patient Instructions (Signed)
Pt notified with any questions /concerns about the Dexcom G7

## 2022-09-16 NOTE — Patient Instructions (Addendum)
Continue pantoprazole 40 mg twice daily.   Continue Zofran as needed.  Please have your hemoglobin rechecked with your next blood draw.  I will provide you with the lab slips for [facility] today.  We will follow-up in 6 months, sooner if needed.  It was a pleasure to see you today. I want to create trusting relationships with patients. If you receive a survey regarding your visit,  I greatly appreciate you taking time to fill this out on paper or through your MyChart. I value your feedback.  Brooke Bonito, MSN, FNP-BC, AGACNP-BC [facility]

## 2022-09-16 NOTE — Progress Notes (Signed)
Pt came in today needing help with the  Dexcom G7 sensor application. She explained that she has no one that will help her at home and is having some vision issues. She states she will try to bring someone in with her that might be able to help her. The old sensor was removed and new sensor was put on pts L arm.

## 2022-09-17 DIAGNOSIS — E039 Hypothyroidism, unspecified: Secondary | ICD-10-CM | POA: Diagnosis not present

## 2022-09-17 DIAGNOSIS — E1040 Type 1 diabetes mellitus with diabetic neuropathy, unspecified: Secondary | ICD-10-CM | POA: Diagnosis not present

## 2022-09-17 DIAGNOSIS — N186 End stage renal disease: Secondary | ICD-10-CM | POA: Diagnosis not present

## 2022-09-17 DIAGNOSIS — L299 Pruritus, unspecified: Secondary | ICD-10-CM | POA: Diagnosis not present

## 2022-09-17 DIAGNOSIS — D509 Iron deficiency anemia, unspecified: Secondary | ICD-10-CM | POA: Diagnosis not present

## 2022-09-17 DIAGNOSIS — D631 Anemia in chronic kidney disease: Secondary | ICD-10-CM | POA: Diagnosis not present

## 2022-09-17 DIAGNOSIS — N2581 Secondary hyperparathyroidism of renal origin: Secondary | ICD-10-CM | POA: Diagnosis not present

## 2022-09-17 DIAGNOSIS — Z992 Dependence on renal dialysis: Secondary | ICD-10-CM | POA: Diagnosis not present

## 2022-09-20 DIAGNOSIS — L299 Pruritus, unspecified: Secondary | ICD-10-CM | POA: Diagnosis not present

## 2022-09-20 DIAGNOSIS — D509 Iron deficiency anemia, unspecified: Secondary | ICD-10-CM | POA: Diagnosis not present

## 2022-09-20 DIAGNOSIS — N186 End stage renal disease: Secondary | ICD-10-CM | POA: Diagnosis not present

## 2022-09-20 DIAGNOSIS — N2581 Secondary hyperparathyroidism of renal origin: Secondary | ICD-10-CM | POA: Diagnosis not present

## 2022-09-20 DIAGNOSIS — E1040 Type 1 diabetes mellitus with diabetic neuropathy, unspecified: Secondary | ICD-10-CM | POA: Diagnosis not present

## 2022-09-20 DIAGNOSIS — E039 Hypothyroidism, unspecified: Secondary | ICD-10-CM | POA: Diagnosis not present

## 2022-09-20 DIAGNOSIS — Z992 Dependence on renal dialysis: Secondary | ICD-10-CM | POA: Diagnosis not present

## 2022-09-20 DIAGNOSIS — D631 Anemia in chronic kidney disease: Secondary | ICD-10-CM | POA: Diagnosis not present

## 2022-09-21 ENCOUNTER — Ambulatory Visit (INDEPENDENT_AMBULATORY_CARE_PROVIDER_SITE_OTHER): Payer: 59 | Admitting: "Endocrinology

## 2022-09-21 ENCOUNTER — Encounter: Payer: Self-pay | Admitting: "Endocrinology

## 2022-09-21 VITALS — BP 114/76 | HR 80 | Ht 68.0 in | Wt 122.4 lb

## 2022-09-21 DIAGNOSIS — E559 Vitamin D deficiency, unspecified: Secondary | ICD-10-CM | POA: Diagnosis not present

## 2022-09-21 DIAGNOSIS — E1065 Type 1 diabetes mellitus with hyperglycemia: Secondary | ICD-10-CM

## 2022-09-21 DIAGNOSIS — E782 Mixed hyperlipidemia: Secondary | ICD-10-CM

## 2022-09-21 LAB — CBC
Hematocrit: 37.1 % (ref 34.0–46.6)
Hemoglobin: 11.8 g/dL (ref 11.1–15.9)
MCH: 27 pg (ref 26.6–33.0)
MCHC: 31.8 g/dL (ref 31.5–35.7)
MCV: 85 fL (ref 79–97)
Platelets: 69 10*3/uL — CL (ref 150–450)
RBC: 4.37 x10E6/uL (ref 3.77–5.28)
RDW: 16 % — ABNORMAL HIGH (ref 11.7–15.4)
WBC: 9.4 10*3/uL (ref 3.4–10.8)

## 2022-09-21 MED ORDER — FIASP FLEXTOUCH 100 UNIT/ML ~~LOC~~ SOPN: 4.0000 [IU] | PEN_INJECTOR | Freq: Three times a day (TID) | SUBCUTANEOUS | 1 refills | Status: DC

## 2022-09-21 MED ORDER — BASAGLAR KWIKPEN 100 UNIT/ML ~~LOC~~ SOPN: 14.0000 [IU] | PEN_INJECTOR | Freq: Every day | SUBCUTANEOUS | 1 refills | Status: DC

## 2022-09-21 NOTE — Patient Instructions (Signed)

## 2022-09-21 NOTE — Progress Notes (Unsigned)
09/21/2022, 4:02 PM  Endocrinology follow-up note   Subjective:    Patient ID: Lindsey Murillo, female    DOB: 09/09/1995.  Lindsey Murillo is being seen in consultation for management of currently uncontrolled symptomatic diabetes requested by  [name], DO.   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 10/21/2021   Diabetic autonomic neuropathy ([name])    Diabetic peripheral neuropathy ([name])    Environmental allergies    Goiter    History of blood transfusion    Hypertension    no meds currently   Hypoglycemia associated with diabetes ([name])    Musculoskeletal pain 04/20/2022   Tachycardia    Thyroiditis, autoimmune    Type 1 diabetes mellitus in patient age 13-19 years with HbA1C goal below 7.5     Past Surgical History:  Procedure Laterality Date   AV FISTULA PLACEMENT Right 12/08/2021   Procedure: ARTERIOVENOUS  FISTULA CREATION VERSUS GRAFT;  Surgeon: Larina Earthly, MD;  Location: [facility];  Service: Vascular;  Laterality: Right;   BASCILIC VEIN TRANSPOSITION Right 01/12/2022   Procedure: RIGHT ARM SECOND STAGE BASILIC VEIN TRANSPOSITION;  Surgeon: Larina Earthly, MD;  Location: [facility];  Service: Vascular;  Laterality: Right;   BIOPSY  08/27/2016   Procedure: BIOPSY;  Surgeon: West Bali, MD;  Location: [facility];  Service: Endoscopy;;  duodenum; gastric   BIOPSY  06/27/2019   Procedure: BIOPSY;  Surgeon: Corbin Ade, MD;  Location: [facility];  Service: Endoscopy;;   BIOPSY  02/09/2022   Procedure: BIOPSY;  Surgeon: Lanelle Bal, DO;  Location: [facility];  Service: Endoscopy;;   BREAST CYST EXCISION Right 11/12/2021    Procedure: RIGHT BREAST ABSCESS INCISION AND DRAINAGE;  Surgeon: Emelia Loron, MD;  Location: [facility] OR;  Service: General;  Laterality: Right;  LMA   COLONOSCOPY     COLONOSCOPY WITH PROPOFOL N/A 02/09/2022   Procedure: COLONOSCOPY WITH PROPOFOL;  Surgeon: Lanelle Bal, DO;  Location: [facility];  Service: Endoscopy;  Laterality: N/A;  7:30am, asa 3, dialysis pt   ESOPHAGOGASTRODUODENOSCOPY N/A 08/27/2016   Dr. Darrick Penna: mild gastritis. Negative celiac. No obvious source for dyspepsia/diarrhea   ESOPHAGOGASTRODUODENOSCOPY (EGD) WITH PROPOFOL N/A 06/27/2019   rourk: Focal abnormality of the gastric mucosa likely due to trauma (heaving).  Biopsy showed mild gastritis, negative for H. pylori.  Esophageal dilation for history of dysphagia but normal-appearing esophagus.   ESOPHAGOGASTRODUODENOSCOPY (EGD) WITH PROPOFOL N/A 02/09/2022   Procedure: ESOPHAGOGASTRODUODENOSCOPY (EGD) WITH PROPOFOL;  Surgeon: Lanelle Bal, DO;  [facility];  Service: Endoscopy;  Laterality: N/A;   IR FLUORO GUIDE CV LINE RIGHT  10/13/2021   IR FLUORO GUIDE CV LINE RIGHT  10/19/2021   IR US GUIDE VASC ACCESS RIGHT  10/13/2021   IRRIGATION AND DEBRIDEMENT ABSCESS N/A 10/04/2021   Procedure: IRRIGATION  AND DEBRIDEMENT NECK ABSCESS;  Surgeon: Axel Filler, MD;  Location: [facility] OR;  Service: General;  Laterality: N/A;    Social History   Socioeconomic History   Marital status: Single    Spouse name: Not on file   Number of children: 0   Years of education: Not on file   Highest education level: High school graduate  Occupational History   Occupation: unemployed  Tobacco Use   Smoking status: Never    Passive exposure: Never   Smokeless tobacco: Never  Vaping Use   Vaping Use: Never used  Substance and Sexual Activity   Alcohol use: No   Drug use: No   Sexual activity: Not Currently    Birth control/protection: Abstinence, None  Other Topics Concern   Not on file  Social History Narrative    Not on file   Social Determinants of Health   Financial Resource Strain: Not on file  Food Insecurity: No Food Insecurity (07/02/2019)   Hunger Vital Sign    Worried About Running Out of Food in the Last Year: Never true    Ran Out of Food in the Last Year: Never true  Transportation Needs: No Transportation Needs (07/02/2019)   PRAPARE - Administrator, Civil Service (Medical): No    Lack of Transportation (Non-Medical): No  Physical Activity: Not on file  Stress: Not on file  Social Connections: Moderately Integrated (07/02/2019)   Social Connection and Isolation Panel [NHANES]    Frequency of Communication with Friends and Family: More than three times a week    Frequency of Social Gatherings with Friends and Family: More than three times a week    Attends Religious Services: 1 to 4 times per year    Active Member of [employer] or Organizations: Yes    Attends Banker Meetings: 1 to 4 times per year    Marital Status: Never married    Family History  Problem Relation Age of Onset   Diabetes Mother        Type II DM   Thyroid disease Mother    Diabetes Maternal Grandmother        Type II DM   Diabetes Cousin        Type II DM   Colon cancer Neg Hx    Colon polyps Neg Hx     Outpatient Encounter Medications as of 09/21/2022  Medication Sig   acetaminophen (TYLENOL) 325 MG tablet Take by mouth.   albuterol (VENTOLIN HFA) 108 (90 Base) MCG/ACT inhaler Inhale into the lungs.   Blood Glucose Monitoring Suppl (ACCU-CHEK GUIDE ME) w/Device KIT 1 Piece by Does not apply route as directed.   cetirizine (ZYRTEC) 5 MG tablet 1/2 tab once daily   Continuous Glucose Receiver (FREESTYLE LIBRE 3 READER) DEVI 1 Piece by Does not apply route once as needed for up to 1 dose.   Continuous Glucose Sensor (FREESTYLE LIBRE 3 SENSOR) MISC 1 Piece by Does not apply route every 14 (fourteen) days. Place 1 sensor on the skin every 14 days. Use to check glucose continuously    Fluticasone Furoate (ARNUITY ELLIPTA) 100 MCG/ACT AEPB Inhale 1 Dose into the lungs daily.   glucose blood (ACCU-CHEK GUIDE) test strip Use to monitor glucose 4 times a day as instructed   insulin aspart (FIASP FLEXTOUCH) 100 UNIT/ML FlexTouch Pen Inject 4-7 Units into the skin 3 (three) times daily before meals.   Insulin Glargine (BASAGLAR KWIKPEN) 100 UNIT/ML Inject 14 Units into the skin  at bedtime.   Insulin Pen Needle 31G X 4 MM MISC Use as directed to administer insulin.   Methoxy PEG-Epoetin Beta (MIRCERA IJ) Mircera   Metoprolol Succinate 50 MG CS24 Take 50 mg by mouth at bedtime.   ondansetron (ZOFRAN-ODT) 4 MG disintegrating tablet 4mg  ODT q4 hours prn nausea/vomit (Patient taking differently: Take 4 mg by mouth every 4 (four) hours as needed for nausea or vomiting. 4mg  ODT q4 hours prn nausea/vomit)   pantoprazole (PROTONIX) 40 MG tablet Take 1 tablet (40 mg total) by mouth 2 (two) times daily before a meal.   rosuvastatin (CRESTOR) 10 MG tablet Take 1 tablet (10 mg total) by mouth daily.   sucroferric oxyhydroxide (VELPHORO) 500 MG chewable tablet Chew 500 mg by mouth 3 (three) times daily with meals.   Vitamin D, Ergocalciferol, (DRISDOL) 1.25 MG (50000 UNIT) CAPS capsule Take 50,000 Units by mouth every 7 (seven) days.   [DISCONTINUED] insulin aspart (FIASP FLEXTOUCH) 100 UNIT/ML FlexTouch Pen Inject 5-7 Units into the skin 3 (three) times daily before meals. (Patient taking differently: Inject 5-8 Units into the skin 3 (three) times daily before meals.)   [DISCONTINUED] Insulin Glargine (BASAGLAR KWIKPEN) 100 UNIT/ML Inject 16 Units into the skin at bedtime.   No facility-administered encounter medications on file as of 09/21/2022.    ALLERGIES: No Known Allergies  VACCINATION STATUS: Immunization History  Administered Date(s) Administered   Influenza,inj,Quad PF,6+ Mos 03/13/2013, 02/25/2014, 04/09/2016, 01/26/2017, 01/18/2019, 06/15/2021, 02/08/2022   Td 04/15/2017     Diabetes She has type 1 diabetes mellitus. Her disease course has been worsening. There are no hypoglycemic associated symptoms. Associated symptoms include polydipsia and polyuria. There are no hypoglycemic complications. Symptoms are worsening. Diabetic complications include nephropathy and peripheral neuropathy. (Non adherence to medical treatment.) Risk factors for coronary artery disease include diabetes mellitus, dyslipidemia and sedentary lifestyle. Her weight is fluctuating minimally. She is following a generally unhealthy diet. When asked about meal planning, she reported none. She never participates in exercise. (She missed her appointment since September 2023.  Tragically, she presents with no meter nor logs.  Evidently, she lost coverage for a CGM.  Her recent A1c was 10.5% increasing from 8.3% during her last visit.  She is now on hemodialysis.  She denies hypoglycemia.  She remains only on Basaglar 18 units nightly.) An ACE inhibitor/angiotensin II receptor blocker is being taken.  Hyperlipidemia This is a chronic problem. Exacerbating diseases include diabetes. Current antihyperlipidemic treatment includes statins. Risk factors for coronary artery disease include dyslipidemia, diabetes mellitus, hypertension and a sedentary lifestyle.  Hypertension This is a chronic problem. Risk factors for coronary artery disease include dyslipidemia and diabetes mellitus. Past treatments include ACE inhibitors.     Review of Systems  Endocrine: Positive for polydipsia and polyuria.    Objective:       09/21/2022    3:15 PM 09/16/2022    3:47 PM 09/16/2022    3:22 PM  Vitals with BMI  Height 5\' 8"  5\' 8"  5\' 9"   Weight 122 lbs 6 oz 127 lbs 3 oz 123 lbs  BMI 18.62 19.35 18.16  Systolic 114 104 409  Diastolic 76 66 78  Pulse 80 91     BP 114/76   Pulse 80   Ht 5\' 8"  (1.727 m)   Wt 122 lb 6.4 oz (55.5 kg)   BMI 18.61 kg/m   Wt Readings from Last 3 Encounters:  09/21/22 122 lb 6.4 oz  (55.5 kg)  09/16/22 127 lb 3.2 oz (  57.7 kg)  09/16/22 123 lb (55.8 kg)     Physical Exam    CMP ( most recent) CMP     Component Value Date/Time   NA 129 (L) 06/12/2022 1401   NA 132 (L) 09/24/2021 1453   K 4.7 06/12/2022 1401   CL 92 (L) 06/12/2022 1401   CO2 23 06/12/2022 1401   GLUCOSE 225 (H) 06/12/2022 1401   BUN 33 (H) 06/12/2022 1401   BUN 36 (H) 09/24/2021 1453   CREATININE 5.14 (H) 06/12/2022 1401   CREATININE 0.98 08/29/2019 1637   CALCIUM 8.9 06/12/2022 1401   PROT 7.1 06/12/2022 1620   PROT 5.5 (L) 09/24/2021 1453   ALBUMIN 3.4 (L) 06/12/2022 1620   ALBUMIN 2.1 (L) 09/24/2021 1453   AST 38 06/12/2022 1620   ALT 79 (H) 06/12/2022 1620   ALKPHOS 288 (H) 06/12/2022 1620   BILITOT 0.7 06/12/2022 1620   BILITOT <0.2 09/24/2021 1453   GFRNONAA 11 (L) 06/12/2022 1401   GFRAA >60 10/27/2019 2021     Diabetic Labs (most recent): Lab Results  Component Value Date   HGBA1C 10.5 (H) 07/20/2022   HGBA1C 8.3 (A) 01/21/2022   HGBA1C 9.8 (H) 09/24/2021   MICROALBUR 1,195.2 (H) 06/13/2021   MICROALBUR 0.50 11/28/2012   MICROALBUR 0.50 07/20/2012     Lipid Panel ( most recent) Lipid Panel     Component Value Date/Time   CHOL 190 07/20/2022 1440   TRIG 500 (H) 07/20/2022 1440   HDL 73 07/20/2022 1440   CHOLHDL 2.6 07/20/2022 1440   CHOLHDL 2.0 11/05/2015 1252   VLDL 12 11/05/2015 1252   LDLCALC 46 07/20/2022 1440   LABVLDL 71 (H) 07/20/2022 1440      Lab Results  Component Value Date   TSH 0.975 10/03/2021   TSH 2.520 05/26/2021   TSH 1.802 05/14/2021   TSH 0.342 (L) 05/27/2018   TSH 0.55 11/05/2015   TSH 1.175 11/28/2012   TSH 0.706 07/20/2012   TSH 1.008 08/26/2011   FREET4 1.4 11/05/2015   FREET4 1.26 11/28/2012   FREET4 1.44 07/20/2012   FREET4 1.23 08/26/2011      Assessment & Plan:   1. Uncontrolled type 1 diabetes mellitus with ESRD    Lindsey Murillo has currently uncontrolled symptomatic type (?1) DM since  27 years of  age.  She missed her appointment since September 2023.  Tragically, she presents with no meter nor logs.  Evidently, she lost coverage for a CGM.  Her recent A1c was 10.5% increasing from 8.3% during her last visit.  She is now on hemodialysis.  She denies hypoglycemia.  She remains only on Basaglar 18 units nightly.  She presents with her meter showing average blood glucose of 159-195 for the last 30 days.  In the interim, she has lower her Semglee to 12 units nightly, continue to hold her prandial insulin.    - I had a long discussion with her about the progressive nature of diabetes and the pathology behind its complications. -her diabetes is complicated by peripheral neuropathy, ESRD, noncompliance/nonadherence, peripheral ? Lymphedema she remains at a high risk for more acute and chronic complications which include CAD, CVA, CKD, retinopathy, and neuropathy. These are all discussed in detail with her.  - I discussed all available options of managing her diabetes including de-escalation of medications. I have counseled her on diet  and weight management  by adopting a Whole Food , Plant Predominant  ( WFPP) nutrition as recommended by [employer] of  Lifestyle Medicine. Patient is encouraged to switch to  unprocessed or minimally processed  complex starch, adequate protein intake (mainly plant source), minimal liquid fat ( mainly vegetable oils), plenty of fruits, and vegetables. -  she is advised to stick to a routine mealtimes to eat 3 complete meals a day and snack only when necessary ( to snack only to correct hypoglycemia BG <70 day time or <100 at night).   - she acknowledges that there is a room for improvement in her food and drink choices. - Further Specific Suggestion is made for her to avoid simple carbohydrates  from her diet including Cakes, Sweet Desserts, Ice Cream, Soda (diet and regular), Sweet Tea, Candies, Chips, Cookies, Store Bought Juices, Alcohol ,  Artificial Sweeteners,   Coffee Creamer, and "Sugar-free" Products. This will help patient to have more stable blood glucose profile and potentially avoid unintended weight gain. - I have approached her with the following plan to manage  her diabetes and patient agrees:   Her lack of  engagement for proper monitoring and proper follow-up makes it difficult to optimize her diabetes care.  She clearly needs multiple daily injections of insulin involving basal/bolus.    I discussed and prescribed the Dexcom CGM device for her.  She previously had freestyle libre device.  She could not keep it on her skin, and finally decided not to wear it. In the meantime, she is approached to start monitoring blood glucose 4 times a day-before meals and at bedtime.    Accordingly, I have advised her to lower her Basaglar to 16 units nightly, discussed and initiated Fiasp 5 units 3 times daily AC for Premeal blood glucose readings above 90 mg/day.  She is also given patient specific correction dose from [place].     She is encouraged to call clinic for blood glucose readings less than 70 or greater than 200 mg per DL x3.    - she is warned not to take insulin without proper monitoring per orders. - She does not have a suitable , non insulin option to treat her diabetes.  - Specific targets for  A1c;  LDL, HDL,  and Triglycerides were discussed with the patient.  2) Blood Pressure /Hypertension:  -Her blood pressure is controlled to target. .  she is advised to continue her current medications including  Lisinopril 20mg  p.o. daily with breakfast .  3) Lipids/Hyperlipidemia:   Review of her recent lipid panel showed improving LDL to 46.  This is dropping from 109.    she  is advised to continue Lipitor 10 mg p.o. daily at bedtime.    Side effects and precautions discussed with her.  4)  Weight/Diet:  Body mass index is 18.61 kg/m.   She is not a candidate for weight loss.  She was previously given the lifestyle medicine package,  struggling to implement.  5) Chronic Care/Health Maintenance:  -she  is on ACEI/ARB and Statin medications and  is encouraged to initiate and continue to follow up with Ophthalmology, Dentist,  Podiatrist at least yearly or according to recommendations, and advised to  stay away from smoking. I have recommended yearly flu vaccine and pneumonia vaccine at least every 5 years; moderate intensity exercise for up to 150 minutes weekly; and  sleep for 7- 9 hours a day.  - she is  advised to maintain close follow up with [name], DO for primary care needs, as well as her other providers for optimal and coordinated care.  I  spent  42  minutes in the care of the patient today including review of labs from CMP, Lipids, Thyroid Function, Hematology (current and previous including abstractions from other facilities); face-to-face time discussing  her blood glucose readings/logs, discussing hypoglycemia and hyperglycemia episodes and symptoms, medications doses, her options of short and long term treatment based on the latest standards of care / guidelines;  discussion about incorporating lifestyle medicine;  and documenting the encounter. Risk reduction counseling performed per USPSTF guidelines to reduce cardiovascular risk factors.     Please refer to Patient Instructions for Blood Glucose Monitoring and Insulin/Medications Dosing Guide"  in media tab for additional information. Please  also refer to " Patient Self Inventory" in the Media  tab for reviewed elements of pertinent patient history.  [employer] participated in the discussions, expressed understanding, and voiced agreement with the above plans.  All questions were answered to her satisfaction. she is encouraged to contact clinic should she have any questions or concerns prior to her return visit.     Follow up plan: - Return in about 3 months (around 12/22/2022) for Bring Meter/CGM Device/Logs- A1c in Office.  Marquis Lunch, MD [facility] [place] [address] [place], [place] [address] Phone: [phone]  Fax: [phone]    09/21/2022, 4:02 PM  This note was partially dictated with voice recognition software. Similar sounding words can be transcribed inadequately or may not  be corrected upon review.

## 2022-09-22 DIAGNOSIS — E1040 Type 1 diabetes mellitus with diabetic neuropathy, unspecified: Secondary | ICD-10-CM | POA: Diagnosis not present

## 2022-09-22 DIAGNOSIS — L299 Pruritus, unspecified: Secondary | ICD-10-CM | POA: Diagnosis not present

## 2022-09-22 DIAGNOSIS — D631 Anemia in chronic kidney disease: Secondary | ICD-10-CM | POA: Diagnosis not present

## 2022-09-22 DIAGNOSIS — Z992 Dependence on renal dialysis: Secondary | ICD-10-CM | POA: Diagnosis not present

## 2022-09-22 DIAGNOSIS — D509 Iron deficiency anemia, unspecified: Secondary | ICD-10-CM | POA: Diagnosis not present

## 2022-09-22 DIAGNOSIS — N186 End stage renal disease: Secondary | ICD-10-CM | POA: Diagnosis not present

## 2022-09-22 DIAGNOSIS — E039 Hypothyroidism, unspecified: Secondary | ICD-10-CM | POA: Diagnosis not present

## 2022-09-22 DIAGNOSIS — N2581 Secondary hyperparathyroidism of renal origin: Secondary | ICD-10-CM | POA: Diagnosis not present

## 2022-09-23 ENCOUNTER — Encounter: Payer: Self-pay | Admitting: "Endocrinology

## 2022-09-24 DIAGNOSIS — L299 Pruritus, unspecified: Secondary | ICD-10-CM | POA: Diagnosis not present

## 2022-09-24 DIAGNOSIS — N2581 Secondary hyperparathyroidism of renal origin: Secondary | ICD-10-CM | POA: Diagnosis not present

## 2022-09-24 DIAGNOSIS — E039 Hypothyroidism, unspecified: Secondary | ICD-10-CM | POA: Diagnosis not present

## 2022-09-24 DIAGNOSIS — D509 Iron deficiency anemia, unspecified: Secondary | ICD-10-CM | POA: Diagnosis not present

## 2022-09-24 DIAGNOSIS — E1040 Type 1 diabetes mellitus with diabetic neuropathy, unspecified: Secondary | ICD-10-CM | POA: Diagnosis not present

## 2022-09-24 DIAGNOSIS — D631 Anemia in chronic kidney disease: Secondary | ICD-10-CM | POA: Diagnosis not present

## 2022-09-24 DIAGNOSIS — N186 End stage renal disease: Secondary | ICD-10-CM | POA: Diagnosis not present

## 2022-09-24 DIAGNOSIS — Z992 Dependence on renal dialysis: Secondary | ICD-10-CM | POA: Diagnosis not present

## 2022-09-27 DIAGNOSIS — D631 Anemia in chronic kidney disease: Secondary | ICD-10-CM | POA: Diagnosis not present

## 2022-09-27 DIAGNOSIS — N2581 Secondary hyperparathyroidism of renal origin: Secondary | ICD-10-CM | POA: Diagnosis not present

## 2022-09-27 DIAGNOSIS — E039 Hypothyroidism, unspecified: Secondary | ICD-10-CM | POA: Diagnosis not present

## 2022-09-27 DIAGNOSIS — Z992 Dependence on renal dialysis: Secondary | ICD-10-CM | POA: Diagnosis not present

## 2022-09-27 DIAGNOSIS — E1040 Type 1 diabetes mellitus with diabetic neuropathy, unspecified: Secondary | ICD-10-CM | POA: Diagnosis not present

## 2022-09-27 DIAGNOSIS — D509 Iron deficiency anemia, unspecified: Secondary | ICD-10-CM | POA: Diagnosis not present

## 2022-09-27 DIAGNOSIS — N186 End stage renal disease: Secondary | ICD-10-CM | POA: Diagnosis not present

## 2022-09-27 DIAGNOSIS — L299 Pruritus, unspecified: Secondary | ICD-10-CM | POA: Diagnosis not present

## 2022-09-28 ENCOUNTER — Other Ambulatory Visit: Payer: Self-pay | Admitting: *Deleted

## 2022-09-28 ENCOUNTER — Ambulatory Visit (INDEPENDENT_AMBULATORY_CARE_PROVIDER_SITE_OTHER): Payer: 59 | Admitting: "Endocrinology

## 2022-09-28 DIAGNOSIS — D509 Iron deficiency anemia, unspecified: Secondary | ICD-10-CM

## 2022-09-28 DIAGNOSIS — D649 Anemia, unspecified: Secondary | ICD-10-CM

## 2022-09-28 DIAGNOSIS — E1065 Type 1 diabetes mellitus with hyperglycemia: Secondary | ICD-10-CM | POA: Diagnosis not present

## 2022-10-01 ENCOUNTER — Encounter: Payer: Self-pay | Admitting: *Deleted

## 2022-10-01 DIAGNOSIS — E1040 Type 1 diabetes mellitus with diabetic neuropathy, unspecified: Secondary | ICD-10-CM | POA: Diagnosis not present

## 2022-10-01 DIAGNOSIS — D509 Iron deficiency anemia, unspecified: Secondary | ICD-10-CM | POA: Diagnosis not present

## 2022-10-01 DIAGNOSIS — Z992 Dependence on renal dialysis: Secondary | ICD-10-CM | POA: Diagnosis not present

## 2022-10-01 DIAGNOSIS — E039 Hypothyroidism, unspecified: Secondary | ICD-10-CM | POA: Diagnosis not present

## 2022-10-01 DIAGNOSIS — L299 Pruritus, unspecified: Secondary | ICD-10-CM | POA: Diagnosis not present

## 2022-10-01 DIAGNOSIS — N2581 Secondary hyperparathyroidism of renal origin: Secondary | ICD-10-CM | POA: Diagnosis not present

## 2022-10-01 DIAGNOSIS — N186 End stage renal disease: Secondary | ICD-10-CM | POA: Diagnosis not present

## 2022-10-01 DIAGNOSIS — D631 Anemia in chronic kidney disease: Secondary | ICD-10-CM | POA: Diagnosis not present

## 2022-10-01 NOTE — Telephone Encounter (Signed)
Mychart message sent to patient.

## 2022-10-04 DIAGNOSIS — N186 End stage renal disease: Secondary | ICD-10-CM | POA: Diagnosis not present

## 2022-10-04 DIAGNOSIS — Z992 Dependence on renal dialysis: Secondary | ICD-10-CM | POA: Diagnosis not present

## 2022-10-04 DIAGNOSIS — D631 Anemia in chronic kidney disease: Secondary | ICD-10-CM | POA: Diagnosis not present

## 2022-10-04 DIAGNOSIS — N2581 Secondary hyperparathyroidism of renal origin: Secondary | ICD-10-CM | POA: Diagnosis not present

## 2022-10-04 DIAGNOSIS — D509 Iron deficiency anemia, unspecified: Secondary | ICD-10-CM | POA: Diagnosis not present

## 2022-10-04 DIAGNOSIS — E039 Hypothyroidism, unspecified: Secondary | ICD-10-CM | POA: Diagnosis not present

## 2022-10-04 DIAGNOSIS — E1040 Type 1 diabetes mellitus with diabetic neuropathy, unspecified: Secondary | ICD-10-CM | POA: Diagnosis not present

## 2022-10-04 DIAGNOSIS — L299 Pruritus, unspecified: Secondary | ICD-10-CM | POA: Diagnosis not present

## 2022-10-06 DIAGNOSIS — E1040 Type 1 diabetes mellitus with diabetic neuropathy, unspecified: Secondary | ICD-10-CM | POA: Diagnosis not present

## 2022-10-06 DIAGNOSIS — L299 Pruritus, unspecified: Secondary | ICD-10-CM | POA: Diagnosis not present

## 2022-10-06 DIAGNOSIS — D509 Iron deficiency anemia, unspecified: Secondary | ICD-10-CM | POA: Diagnosis not present

## 2022-10-06 DIAGNOSIS — E039 Hypothyroidism, unspecified: Secondary | ICD-10-CM | POA: Diagnosis not present

## 2022-10-06 DIAGNOSIS — N186 End stage renal disease: Secondary | ICD-10-CM | POA: Diagnosis not present

## 2022-10-06 DIAGNOSIS — N2581 Secondary hyperparathyroidism of renal origin: Secondary | ICD-10-CM | POA: Diagnosis not present

## 2022-10-06 DIAGNOSIS — Z992 Dependence on renal dialysis: Secondary | ICD-10-CM | POA: Diagnosis not present

## 2022-10-06 DIAGNOSIS — D631 Anemia in chronic kidney disease: Secondary | ICD-10-CM | POA: Diagnosis not present

## 2022-10-07 NOTE — Progress Notes (Signed)
Patient presented to the office for help in applying her G7 sensor. The sensor was applied to her left arm, patient tolerated well. Patient states that she may come back for her next application, she shares that she doe not have help with this at home.

## 2022-10-07 NOTE — Progress Notes (Deleted)
Patient presented to the office to have her Dexcom G7 sensor applied. This was applied to her left arm, patient tolerated well.

## 2022-10-08 DIAGNOSIS — N186 End stage renal disease: Secondary | ICD-10-CM | POA: Diagnosis not present

## 2022-10-08 DIAGNOSIS — N032 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: Secondary | ICD-10-CM | POA: Diagnosis not present

## 2022-10-08 DIAGNOSIS — Z992 Dependence on renal dialysis: Secondary | ICD-10-CM | POA: Diagnosis not present

## 2022-10-09 DIAGNOSIS — R52 Pain, unspecified: Secondary | ICD-10-CM | POA: Diagnosis not present

## 2022-10-09 DIAGNOSIS — D631 Anemia in chronic kidney disease: Secondary | ICD-10-CM | POA: Diagnosis not present

## 2022-10-09 DIAGNOSIS — Z992 Dependence on renal dialysis: Secondary | ICD-10-CM | POA: Diagnosis not present

## 2022-10-09 DIAGNOSIS — N2581 Secondary hyperparathyroidism of renal origin: Secondary | ICD-10-CM | POA: Diagnosis not present

## 2022-10-09 DIAGNOSIS — L299 Pruritus, unspecified: Secondary | ICD-10-CM | POA: Diagnosis not present

## 2022-10-09 DIAGNOSIS — E1040 Type 1 diabetes mellitus with diabetic neuropathy, unspecified: Secondary | ICD-10-CM | POA: Diagnosis not present

## 2022-10-09 DIAGNOSIS — N186 End stage renal disease: Secondary | ICD-10-CM | POA: Diagnosis not present

## 2022-10-11 DIAGNOSIS — L299 Pruritus, unspecified: Secondary | ICD-10-CM | POA: Diagnosis not present

## 2022-10-11 DIAGNOSIS — D631 Anemia in chronic kidney disease: Secondary | ICD-10-CM | POA: Diagnosis not present

## 2022-10-11 DIAGNOSIS — N186 End stage renal disease: Secondary | ICD-10-CM | POA: Diagnosis not present

## 2022-10-11 DIAGNOSIS — R52 Pain, unspecified: Secondary | ICD-10-CM | POA: Diagnosis not present

## 2022-10-11 DIAGNOSIS — N2581 Secondary hyperparathyroidism of renal origin: Secondary | ICD-10-CM | POA: Diagnosis not present

## 2022-10-11 DIAGNOSIS — E1040 Type 1 diabetes mellitus with diabetic neuropathy, unspecified: Secondary | ICD-10-CM | POA: Diagnosis not present

## 2022-10-11 DIAGNOSIS — Z992 Dependence on renal dialysis: Secondary | ICD-10-CM | POA: Diagnosis not present

## 2022-10-12 ENCOUNTER — Ambulatory Visit (INDEPENDENT_AMBULATORY_CARE_PROVIDER_SITE_OTHER): Payer: 59

## 2022-10-12 VITALS — BP 114/76 | Ht 68.0 in | Wt 122.0 lb

## 2022-10-12 DIAGNOSIS — E1065 Type 1 diabetes mellitus with hyperglycemia: Secondary | ICD-10-CM | POA: Diagnosis not present

## 2022-10-12 NOTE — Progress Notes (Signed)
Pt came in today to have her Dexcom G7 sensor changed. She does not have anyone at home that will help and she is having some vision . Placed new sensor to pts L arm

## 2022-10-13 DIAGNOSIS — D631 Anemia in chronic kidney disease: Secondary | ICD-10-CM | POA: Diagnosis not present

## 2022-10-13 DIAGNOSIS — Z992 Dependence on renal dialysis: Secondary | ICD-10-CM | POA: Diagnosis not present

## 2022-10-13 DIAGNOSIS — R52 Pain, unspecified: Secondary | ICD-10-CM | POA: Diagnosis not present

## 2022-10-13 DIAGNOSIS — E1040 Type 1 diabetes mellitus with diabetic neuropathy, unspecified: Secondary | ICD-10-CM | POA: Diagnosis not present

## 2022-10-13 DIAGNOSIS — N2581 Secondary hyperparathyroidism of renal origin: Secondary | ICD-10-CM | POA: Diagnosis not present

## 2022-10-13 DIAGNOSIS — L299 Pruritus, unspecified: Secondary | ICD-10-CM | POA: Diagnosis not present

## 2022-10-13 DIAGNOSIS — N186 End stage renal disease: Secondary | ICD-10-CM | POA: Diagnosis not present

## 2022-10-14 DIAGNOSIS — R7309 Other abnormal glucose: Secondary | ICD-10-CM | POA: Diagnosis not present

## 2022-10-14 DIAGNOSIS — R5381 Other malaise: Secondary | ICD-10-CM | POA: Diagnosis not present

## 2022-10-14 DIAGNOSIS — E0842 Diabetes mellitus due to underlying condition with diabetic polyneuropathy: Secondary | ICD-10-CM | POA: Diagnosis not present

## 2022-10-14 DIAGNOSIS — Z1159 Encounter for screening for other viral diseases: Secondary | ICD-10-CM | POA: Diagnosis not present

## 2022-10-14 DIAGNOSIS — E1121 Type 2 diabetes mellitus with diabetic nephropathy: Secondary | ICD-10-CM | POA: Diagnosis not present

## 2022-10-14 DIAGNOSIS — E1040 Type 1 diabetes mellitus with diabetic neuropathy, unspecified: Secondary | ICD-10-CM | POA: Diagnosis not present

## 2022-10-14 DIAGNOSIS — Z01810 Encounter for preprocedural cardiovascular examination: Secondary | ICD-10-CM | POA: Diagnosis not present

## 2022-10-14 DIAGNOSIS — Z992 Dependence on renal dialysis: Secondary | ICD-10-CM | POA: Diagnosis not present

## 2022-10-14 DIAGNOSIS — E1021 Type 1 diabetes mellitus with diabetic nephropathy: Secondary | ICD-10-CM | POA: Diagnosis not present

## 2022-10-14 DIAGNOSIS — N186 End stage renal disease: Secondary | ICD-10-CM | POA: Diagnosis not present

## 2022-10-14 DIAGNOSIS — E1122 Type 2 diabetes mellitus with diabetic chronic kidney disease: Secondary | ICD-10-CM | POA: Diagnosis not present

## 2022-10-14 DIAGNOSIS — N185 Chronic kidney disease, stage 5: Secondary | ICD-10-CM | POA: Diagnosis not present

## 2022-10-14 DIAGNOSIS — Z01818 Encounter for other preprocedural examination: Secondary | ICD-10-CM | POA: Diagnosis not present

## 2022-10-14 DIAGNOSIS — Z7682 Awaiting organ transplant status: Secondary | ICD-10-CM | POA: Diagnosis not present

## 2022-10-14 DIAGNOSIS — E1036 Type 1 diabetes mellitus with diabetic cataract: Secondary | ICD-10-CM | POA: Diagnosis not present

## 2022-10-15 DIAGNOSIS — E1040 Type 1 diabetes mellitus with diabetic neuropathy, unspecified: Secondary | ICD-10-CM | POA: Diagnosis not present

## 2022-10-15 DIAGNOSIS — N186 End stage renal disease: Secondary | ICD-10-CM | POA: Diagnosis not present

## 2022-10-15 DIAGNOSIS — N2581 Secondary hyperparathyroidism of renal origin: Secondary | ICD-10-CM | POA: Diagnosis not present

## 2022-10-15 DIAGNOSIS — Z992 Dependence on renal dialysis: Secondary | ICD-10-CM | POA: Diagnosis not present

## 2022-10-15 DIAGNOSIS — D631 Anemia in chronic kidney disease: Secondary | ICD-10-CM | POA: Diagnosis not present

## 2022-10-15 DIAGNOSIS — L299 Pruritus, unspecified: Secondary | ICD-10-CM | POA: Diagnosis not present

## 2022-10-15 DIAGNOSIS — R52 Pain, unspecified: Secondary | ICD-10-CM | POA: Diagnosis not present

## 2022-10-18 DIAGNOSIS — N2581 Secondary hyperparathyroidism of renal origin: Secondary | ICD-10-CM | POA: Diagnosis not present

## 2022-10-18 DIAGNOSIS — N186 End stage renal disease: Secondary | ICD-10-CM | POA: Diagnosis not present

## 2022-10-18 DIAGNOSIS — R52 Pain, unspecified: Secondary | ICD-10-CM | POA: Diagnosis not present

## 2022-10-18 DIAGNOSIS — L299 Pruritus, unspecified: Secondary | ICD-10-CM | POA: Diagnosis not present

## 2022-10-18 DIAGNOSIS — Z992 Dependence on renal dialysis: Secondary | ICD-10-CM | POA: Diagnosis not present

## 2022-10-18 DIAGNOSIS — E1040 Type 1 diabetes mellitus with diabetic neuropathy, unspecified: Secondary | ICD-10-CM | POA: Diagnosis not present

## 2022-10-18 DIAGNOSIS — D631 Anemia in chronic kidney disease: Secondary | ICD-10-CM | POA: Diagnosis not present

## 2022-10-19 ENCOUNTER — Encounter (HOSPITAL_COMMUNITY): Payer: Self-pay | Admitting: Hematology

## 2022-10-20 DIAGNOSIS — N186 End stage renal disease: Secondary | ICD-10-CM | POA: Diagnosis not present

## 2022-10-20 DIAGNOSIS — R52 Pain, unspecified: Secondary | ICD-10-CM | POA: Diagnosis not present

## 2022-10-20 DIAGNOSIS — N2581 Secondary hyperparathyroidism of renal origin: Secondary | ICD-10-CM | POA: Diagnosis not present

## 2022-10-20 DIAGNOSIS — L299 Pruritus, unspecified: Secondary | ICD-10-CM | POA: Diagnosis not present

## 2022-10-20 DIAGNOSIS — D631 Anemia in chronic kidney disease: Secondary | ICD-10-CM | POA: Diagnosis not present

## 2022-10-20 DIAGNOSIS — Z992 Dependence on renal dialysis: Secondary | ICD-10-CM | POA: Diagnosis not present

## 2022-10-20 DIAGNOSIS — E1040 Type 1 diabetes mellitus with diabetic neuropathy, unspecified: Secondary | ICD-10-CM | POA: Diagnosis not present

## 2022-10-21 ENCOUNTER — Ambulatory Visit: Payer: 59

## 2022-10-21 ENCOUNTER — Ambulatory Visit: Payer: Medicare Other | Admitting: Family Medicine

## 2022-10-22 DIAGNOSIS — N2581 Secondary hyperparathyroidism of renal origin: Secondary | ICD-10-CM | POA: Diagnosis not present

## 2022-10-22 DIAGNOSIS — D631 Anemia in chronic kidney disease: Secondary | ICD-10-CM | POA: Diagnosis not present

## 2022-10-22 DIAGNOSIS — Z992 Dependence on renal dialysis: Secondary | ICD-10-CM | POA: Diagnosis not present

## 2022-10-22 DIAGNOSIS — N186 End stage renal disease: Secondary | ICD-10-CM | POA: Diagnosis not present

## 2022-10-22 DIAGNOSIS — E1040 Type 1 diabetes mellitus with diabetic neuropathy, unspecified: Secondary | ICD-10-CM | POA: Diagnosis not present

## 2022-10-22 DIAGNOSIS — L299 Pruritus, unspecified: Secondary | ICD-10-CM | POA: Diagnosis not present

## 2022-10-22 DIAGNOSIS — R52 Pain, unspecified: Secondary | ICD-10-CM | POA: Diagnosis not present

## 2022-10-26 ENCOUNTER — Encounter: Payer: Self-pay | Admitting: Adult Health

## 2022-10-26 ENCOUNTER — Other Ambulatory Visit (HOSPITAL_COMMUNITY)
Admission: RE | Admit: 2022-10-26 | Discharge: 2022-10-26 | Disposition: A | Discharge status: Home / Self Care | Payer: 59 | Source: Ambulatory Visit | Attending: Adult Health | Admitting: Adult Health

## 2022-10-26 ENCOUNTER — Ambulatory Visit (INDEPENDENT_AMBULATORY_CARE_PROVIDER_SITE_OTHER): Payer: 59 | Admitting: Adult Health

## 2022-10-26 VITALS — BP 129/80 | HR 102 | Ht 67.0 in | Wt 120.0 lb

## 2022-10-26 DIAGNOSIS — Z1151 Encounter for screening for human papillomavirus (HPV): Secondary | ICD-10-CM | POA: Insufficient documentation

## 2022-10-26 DIAGNOSIS — N6315 Unspecified lump in the right breast, overlapping quadrants: Secondary | ICD-10-CM

## 2022-10-26 DIAGNOSIS — E1065 Type 1 diabetes mellitus with hyperglycemia: Secondary | ICD-10-CM | POA: Insufficient documentation

## 2022-10-26 DIAGNOSIS — N049 Nephrotic syndrome with unspecified morphologic changes: Secondary | ICD-10-CM

## 2022-10-26 DIAGNOSIS — Z1330 Encounter for screening examination for mental health and behavioral disorders, unspecified: Secondary | ICD-10-CM

## 2022-10-26 DIAGNOSIS — Z01419 Encounter for gynecological examination (general) (routine) without abnormal findings: Secondary | ICD-10-CM | POA: Diagnosis not present

## 2022-10-26 DIAGNOSIS — N911 Secondary amenorrhea: Secondary | ICD-10-CM | POA: Insufficient documentation

## 2022-10-26 DIAGNOSIS — Z0000 Encounter for general adult medical examination without abnormal findings: Secondary | ICD-10-CM

## 2022-10-26 MED ORDER — MEDROXYPROGESTERONE ACETATE 10 MG PO TABS: ORAL_TABLET | ORAL | 0 refills | Status: DC

## 2022-10-26 NOTE — Progress Notes (Signed)
Patient ID: Lindsey Murillo, female   DOB: [birth year], 27 y.o.   MRN: [id number] History of Present Illness: Lindsey Murillo is a 27 year old black female,single, G0P0, in for a well woman gyn exam and first pap. She is type 1 diabetic and has CKD stage 4 on dialysis, hoping to get kidney transplant. She has not had a period in years.  PCP is Dr Adriana Simas.   Current Medications, Allergies, Past Medical History, Past Surgical History, Family History and Social History were reviewed in [employer] record.     Review of Systems: Patient denies any headaches, hearing loss, fatigue, blurred vision, shortness of breath, chest pain, abdominal pain, problems with bowel movements, urination, or intercourse(never had sex). No joint pain or mood swings.  See HPI for positives.   Physical Exam:BP 129/80 (BP Location: Left Arm, Patient Position: Sitting, Cuff Size: Normal)   Pulse (!) 102   Ht 5\' 7"  (1.702 m)   Wt 120 lb (54.4 kg)   BMI 18.79 kg/m   General:  Well developed, well nourished, no acute distress Skin:  Warm and dry Neck:  Midline trachea, normal thyroid, good ROM, no lymphadenopathy Lungs; Clear to auscultation bilaterally Breast:  No dominant palpable mass, retraction, or nipple discharge, on the left. On the right has 3 cm mass at 5-7 0' clock and nipple retraction no discharge, has had surgery in the past on this breast Cardiovascular: Regular rate and rhythm Abdomen:  Soft, non tender, no hepatosplenomegaly Pelvic:  External genitalia is normal in appearance, no lesions.  The vagina is normal in appearance,used small Pederson speculum.Marland Kitchen Urethra has no lesions or masses. The cervix is nulliparous, app with HR HPV genotyping performed.Marland Kitchen  Uterus is felt to be normal size, shape, and contour.  No adnexal masses or tenderness noted.Bladder is non tender, no masses felt.  Extremities/musculoskeletal:  No swelling or varicosities noted, no clubbing or cyanosis Psych:  No mood  changes, alert and cooperative,seems happy AA is 0 Fall risk is low    10/26/2022   10:23 AM 07/20/2022    1:55 PM 04/20/2022    1:20 PM  Depression screen PHQ 2/9  Decreased Interest 1 2 1   Down, Depressed, Hopeless 0 0 0  PHQ - 2 Score 1 2 1   Altered sleeping 2 2 2   Tired, decreased energy 1 0 1  Change in appetite 1 2 0  Feeling bad or failure about yourself  0 0 0  Trouble concentrating 0 0 0  Moving slowly or fidgety/restless 0 0 0  Suicidal thoughts 0 0 0  PHQ-9 Score 5 6 4   Difficult doing work/chores  Not difficult at all        10/26/2022   10:23 AM 07/20/2022    1:55 PM  GAD 7 : Generalized Anxiety Score  Nervous, Anxious, on Edge 1 0  Control/stop worrying 0 0  Worry too much - different things 1 2  Trouble relaxing 1 2  Restless 0 0  Easily annoyed or irritable 0 2  Afraid - awful might happen 0 0  Total GAD 7 Score 3 6  Anxiety Difficulty  Not difficult at all      Upstream - 10/26/22 1020       Pregnancy Intention Screening   Does the patient want to become pregnant in the next year? No    Does the patient's partner want to become pregnant in the next year? No    Would the patient like to discuss contraceptive  options today? No      Contraception Wrap Up   Current Method Abstinence    End Method Abstinence            Examination chaperoned by Malachy Mood LPN   Impression and Plan: 1. Routine general medical examination at a health care facility Pap sent Pap in 3 years if normal Physical and labs with PCP - Cytology - PAP( [place])  2. Amenorrhea, secondary No period in years  Will check labs  - Prolactin - TSH + free T4 - Follicle stimulating hormone Will rx provera 10 mg x 10 days, to see if can get withdrawal bleed Meds ordered this encounter  Medications   medroxyPROGESTERone (PROVERA) 10 MG tablet    Sig: Take 1 10 mg tablet every day x 10 days    Dispense:  10 tablet    Refill:  0    Order Specific Question:   Supervising  Provider    Answer:   Duane Lope H [[id number]]   Follow up with me in 3 weeks for ROS   3. Encounter for routine gynecological examination with Papanicolaou smear of cervix Pap sent  4. Mass overlapping multiple quadrants of right breast 3 cm mass at 5-7 0' clock and nipple retraction no discharge, has had surgery in the past on this breast US scheduled for 11/04/22 at 11:40 am at [facility]  - [place] LIMITED ULTRASOUND INCLUDING AXILLA RIGHT BREAST; Future  5. Uncontrolled type 1 diabetes mellitus with hyperglycemia ([name])  6. Nephrotic syndrome On dialysis, hoping for kidney transplant

## 2022-10-27 DIAGNOSIS — E1040 Type 1 diabetes mellitus with diabetic neuropathy, unspecified: Secondary | ICD-10-CM | POA: Diagnosis not present

## 2022-10-27 DIAGNOSIS — D631 Anemia in chronic kidney disease: Secondary | ICD-10-CM | POA: Diagnosis not present

## 2022-10-27 DIAGNOSIS — L299 Pruritus, unspecified: Secondary | ICD-10-CM | POA: Diagnosis not present

## 2022-10-27 DIAGNOSIS — Z992 Dependence on renal dialysis: Secondary | ICD-10-CM | POA: Diagnosis not present

## 2022-10-27 DIAGNOSIS — N2581 Secondary hyperparathyroidism of renal origin: Secondary | ICD-10-CM | POA: Diagnosis not present

## 2022-10-27 DIAGNOSIS — R52 Pain, unspecified: Secondary | ICD-10-CM | POA: Diagnosis not present

## 2022-10-27 DIAGNOSIS — N186 End stage renal disease: Secondary | ICD-10-CM | POA: Diagnosis not present

## 2022-10-27 LAB — CYTOLOGY - PAP
Adequacy: ABSENT
Comment: NEGATIVE
Diagnosis: NEGATIVE
High risk HPV: NEGATIVE

## 2022-10-27 LAB — TSH+FREE T4
Free T4: 1.19 ng/dL (ref 0.82–1.77)
TSH: 1.06 u[IU]/mL (ref 0.450–4.500)

## 2022-10-27 LAB — FOLLICLE STIMULATING HORMONE: FSH: 7.5 m[IU]/mL

## 2022-10-27 LAB — PROLACTIN: Prolactin: 35.9 ng/mL — ABNORMAL HIGH (ref 4.8–33.4)

## 2022-10-28 DIAGNOSIS — Z992 Dependence on renal dialysis: Secondary | ICD-10-CM | POA: Diagnosis not present

## 2022-10-28 DIAGNOSIS — L299 Pruritus, unspecified: Secondary | ICD-10-CM | POA: Diagnosis not present

## 2022-10-28 DIAGNOSIS — E1040 Type 1 diabetes mellitus with diabetic neuropathy, unspecified: Secondary | ICD-10-CM | POA: Diagnosis not present

## 2022-10-28 DIAGNOSIS — N2581 Secondary hyperparathyroidism of renal origin: Secondary | ICD-10-CM | POA: Diagnosis not present

## 2022-10-28 DIAGNOSIS — N186 End stage renal disease: Secondary | ICD-10-CM | POA: Diagnosis not present

## 2022-10-28 DIAGNOSIS — R52 Pain, unspecified: Secondary | ICD-10-CM | POA: Diagnosis not present

## 2022-10-28 DIAGNOSIS — D631 Anemia in chronic kidney disease: Secondary | ICD-10-CM | POA: Diagnosis not present

## 2022-10-29 DIAGNOSIS — R52 Pain, unspecified: Secondary | ICD-10-CM | POA: Diagnosis not present

## 2022-10-29 DIAGNOSIS — L299 Pruritus, unspecified: Secondary | ICD-10-CM | POA: Diagnosis not present

## 2022-10-29 DIAGNOSIS — D631 Anemia in chronic kidney disease: Secondary | ICD-10-CM | POA: Diagnosis not present

## 2022-10-29 DIAGNOSIS — Z992 Dependence on renal dialysis: Secondary | ICD-10-CM | POA: Diagnosis not present

## 2022-10-29 DIAGNOSIS — E1040 Type 1 diabetes mellitus with diabetic neuropathy, unspecified: Secondary | ICD-10-CM | POA: Diagnosis not present

## 2022-10-29 DIAGNOSIS — N2581 Secondary hyperparathyroidism of renal origin: Secondary | ICD-10-CM | POA: Diagnosis not present

## 2022-10-29 DIAGNOSIS — N186 End stage renal disease: Secondary | ICD-10-CM | POA: Diagnosis not present

## 2022-11-01 DIAGNOSIS — N186 End stage renal disease: Secondary | ICD-10-CM | POA: Diagnosis not present

## 2022-11-01 DIAGNOSIS — D631 Anemia in chronic kidney disease: Secondary | ICD-10-CM | POA: Diagnosis not present

## 2022-11-01 DIAGNOSIS — R52 Pain, unspecified: Secondary | ICD-10-CM | POA: Diagnosis not present

## 2022-11-01 DIAGNOSIS — N2581 Secondary hyperparathyroidism of renal origin: Secondary | ICD-10-CM | POA: Diagnosis not present

## 2022-11-01 DIAGNOSIS — E1040 Type 1 diabetes mellitus with diabetic neuropathy, unspecified: Secondary | ICD-10-CM | POA: Diagnosis not present

## 2022-11-01 DIAGNOSIS — Z992 Dependence on renal dialysis: Secondary | ICD-10-CM | POA: Diagnosis not present

## 2022-11-01 DIAGNOSIS — L299 Pruritus, unspecified: Secondary | ICD-10-CM | POA: Diagnosis not present

## 2022-11-02 ENCOUNTER — Ambulatory Visit (INDEPENDENT_AMBULATORY_CARE_PROVIDER_SITE_OTHER): Payer: 59 | Admitting: Podiatry

## 2022-11-02 ENCOUNTER — Encounter: Payer: Self-pay | Admitting: Podiatry

## 2022-11-02 ENCOUNTER — Ambulatory Visit (INDEPENDENT_AMBULATORY_CARE_PROVIDER_SITE_OTHER): Payer: 59

## 2022-11-02 DIAGNOSIS — L03115 Cellulitis of right lower limb: Secondary | ICD-10-CM | POA: Diagnosis not present

## 2022-11-02 DIAGNOSIS — L97511 Non-pressure chronic ulcer of other part of right foot limited to breakdown of skin: Secondary | ICD-10-CM | POA: Diagnosis not present

## 2022-11-02 MED ORDER — AMOXICILLIN 500 MG PO CAPS: 500.0000 mg | ORAL_CAPSULE | Freq: Two times a day (BID) | ORAL | 0 refills | Status: AC

## 2022-11-02 NOTE — Progress Notes (Unsigned)
Chief Complaint  Patient presents with   Foot Pain    Trim callus sub 1st MPJ right - dialysis wanted it checked since it was dark    HPI: 27 y.o. female presenting today with concern of a discolored callus on the bottom of the right foot.  States that she was at dialysis and they were concerned about it and had her follow-up here today.  She gets dialysis 3 times per week.  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 10/21/2021   Diabetic autonomic neuropathy ([name])    Diabetic peripheral neuropathy ([name])    Environmental allergies    Goiter    History of blood transfusion    Hypertension    no meds currently   Hypoglycemia associated with diabetes ([name])    Musculoskeletal pain 04/20/2022   Tachycardia    Thyroiditis, autoimmune    Type 1 diabetes mellitus in patient age 68-19 years with HbA1C goal below 7.5     Past Surgical History:  Procedure Laterality Date   AV FISTULA PLACEMENT Right 12/08/2021   Procedure: ARTERIOVENOUS  FISTULA CREATION VERSUS GRAFT;  Surgeon: Larina Earthly, MD;  Location: [facility];  Service: Vascular;  Laterality: Right;   BASCILIC VEIN TRANSPOSITION Right 01/12/2022   Procedure: RIGHT ARM SECOND STAGE BASILIC VEIN TRANSPOSITION;  Surgeon: Larina Earthly, MD;  Location: [facility];  Service: Vascular;  Laterality: Right;   BIOPSY  08/27/2016   Procedure: BIOPSY;  Surgeon: West Bali, MD;  [facility];  Service: Endoscopy;;  duodenum; gastric   BIOPSY  06/27/2019   Procedure: BIOPSY;  Surgeon: Corbin Ade, MD;  [facility];  Service: Endoscopy;;   BIOPSY  02/09/2022   Procedure: BIOPSY;  Surgeon: Lanelle Bal, DO;  [facility];  Service: Endoscopy;;   BREAST CYST EXCISION  Right 11/12/2021   Procedure: RIGHT BREAST ABSCESS INCISION AND DRAINAGE;  Surgeon: Emelia Loron, MD;  Location: [facility] OR;  Service: General;  Laterality: Right;  LMA   COLONOSCOPY     COLONOSCOPY WITH PROPOFOL N/A 02/09/2022   Procedure: COLONOSCOPY WITH PROPOFOL;  Surgeon: Lanelle Bal, DO;  [facility];  Service: Endoscopy;  Laterality: N/A;  7:30am, asa 3, dialysis pt   ESOPHAGOGASTRODUODENOSCOPY N/A 08/27/2016   Dr. Darrick Penna: mild gastritis. Negative celiac. No obvious source for dyspepsia/diarrhea   ESOPHAGOGASTRODUODENOSCOPY (EGD) WITH PROPOFOL N/A 06/27/2019   rourk: Focal abnormality of the gastric mucosa likely due to trauma (heaving).  Biopsy showed mild gastritis, negative for H. pylori.  Esophageal dilation for history of dysphagia but normal-appearing esophagus.   ESOPHAGOGASTRODUODENOSCOPY (EGD) WITH PROPOFOL N/A 02/09/2022   Procedure: ESOPHAGOGASTRODUODENOSCOPY (EGD) WITH PROPOFOL;  Surgeon: Lanelle Bal, DO;  [facility];  Service: Endoscopy;  Laterality: N/A;   IR FLUORO GUIDE CV LINE RIGHT  10/13/2021   IR FLUORO GUIDE CV LINE RIGHT  10/19/2021   IR US GUIDE VASC ACCESS RIGHT  10/13/2021   IRRIGATION AND DEBRIDEMENT ABSCESS N/A 10/04/2021   Procedure: IRRIGATION AND DEBRIDEMENT NECK ABSCESS;  Surgeon: Axel Filler, MD;  Location: [facility] OR;  Service: General;  Laterality: N/A;    No Known Allergies   PHYSICAL EXAM:  General: The patient is alert  and oriented x3 in no acute distress.  Dermatology: Skin is warm, dry and supple bilateral lower extremities. Interspaces are clear of maceration and debris.      Wound 1 description:  Location: Right submet 1    Depth: To dermis    Wound Border: Hemorrhagic callus    Odor?:  No    Surrounding Tissue: No surrounding erythema or edema    Infected?:  No    Necrosis?:  No    Pain?:  Yes    Tunneling: No    Dimensions (cm): 0.4 cm x 0.5 cm x 0.2 cm  Vascular: Pedal pulses are intact  Neurological:  Light touch sensation grossly intact bilateral feet.  Vibratory sensation diminished bilateral.  Musculoskeletal Exam: Plantarflexed first metatarsal right foot.    Latest Ref Rng & Units 07/20/2022    2:40 PM 01/21/2022   11:15 AM  Hemoglobin A1C  Hemoglobin-A1c 4.8 - 5.6 % 10.5  8.3     RADIOGRAPHIC EXAM:  Normal osseous mineralization. Joint spaces preserved.  No fractures or osseous irregularities noted.   ASSESSMENT / PLAN OF CARE: 1. Ulcer of right foot, limited to breakdown of skin ([name])     Meds ordered this encounter  Medications   amoxicillin (AMOXIL) 500 MG capsule    Sig: Take 1 capsule (500 mg total) by mouth 2 (two) times daily for 10 days.    Dispense:  20 capsule    Refill:  0   SURGICAL BOOT  The ulceration was sharply debrided of hyperkeratotic and devitalized soft tissue with sterile #312 blade to the level of dermis.  Hemostasis obtained.  Antibiotic ointment and DSD applied.  Reviewed off-loading with patient.  Surgical shoe with peg assist to offload the first metatarsal head was dispensed for patient to wear at all times WB.   Reviewed daily dressing changes with patient.  She will also perform Epsom salt soaks daily for 15 minutes once per day dry the area well and redress  Will set patient up with prism to have wound care supplies sent to her home to help with daily dressing changes.  Due to patient's high risk status, we will start her on amoxicillin 500 mg twice daily  Discussed risks / concerns regarding ulcer with patient and possible sequelae if left untreated.  Stressed importance of infection prevention at home. Short-term goals are:  resolve infection, off-load ulcer, heal ulcer Long-term goals are:  prevent recurrence, prevent amputation.   Return in about 2 weeks (around 11/16/2022) for f/u ulcer.   Clerance Lav, DPM, FACFAS [facility]     [address] [place], [place] [address]                 Office [phone]  Fax [phone]

## 2022-11-03 DIAGNOSIS — N186 End stage renal disease: Secondary | ICD-10-CM | POA: Diagnosis not present

## 2022-11-03 DIAGNOSIS — L97511 Non-pressure chronic ulcer of other part of right foot limited to breakdown of skin: Secondary | ICD-10-CM | POA: Diagnosis not present

## 2022-11-03 DIAGNOSIS — E1040 Type 1 diabetes mellitus with diabetic neuropathy, unspecified: Secondary | ICD-10-CM | POA: Diagnosis not present

## 2022-11-03 DIAGNOSIS — N2581 Secondary hyperparathyroidism of renal origin: Secondary | ICD-10-CM | POA: Diagnosis not present

## 2022-11-03 DIAGNOSIS — D631 Anemia in chronic kidney disease: Secondary | ICD-10-CM | POA: Diagnosis not present

## 2022-11-03 DIAGNOSIS — R52 Pain, unspecified: Secondary | ICD-10-CM | POA: Diagnosis not present

## 2022-11-03 DIAGNOSIS — Z992 Dependence on renal dialysis: Secondary | ICD-10-CM | POA: Diagnosis not present

## 2022-11-03 DIAGNOSIS — L299 Pruritus, unspecified: Secondary | ICD-10-CM | POA: Diagnosis not present

## 2022-11-04 ENCOUNTER — Ambulatory Visit: Payer: Medicare Other | Admitting: Allergy

## 2022-11-04 ENCOUNTER — Ambulatory Visit (HOSPITAL_COMMUNITY)
Admission: RE | Admit: 2022-11-04 | Discharge: 2022-11-04 | Disposition: A | Discharge status: Home / Self Care | Payer: 59 | Source: Ambulatory Visit | Attending: Adult Health | Admitting: Adult Health

## 2022-11-04 DIAGNOSIS — N6310 Unspecified lump in the right breast, unspecified quadrant: Secondary | ICD-10-CM | POA: Diagnosis not present

## 2022-11-04 DIAGNOSIS — N6315 Unspecified lump in the right breast, overlapping quadrants: Secondary | ICD-10-CM | POA: Insufficient documentation

## 2022-11-04 DIAGNOSIS — R9230 Dense breasts, unspecified: Secondary | ICD-10-CM | POA: Diagnosis not present

## 2022-11-05 DIAGNOSIS — L299 Pruritus, unspecified: Secondary | ICD-10-CM | POA: Diagnosis not present

## 2022-11-05 DIAGNOSIS — R52 Pain, unspecified: Secondary | ICD-10-CM | POA: Diagnosis not present

## 2022-11-05 DIAGNOSIS — D631 Anemia in chronic kidney disease: Secondary | ICD-10-CM | POA: Diagnosis not present

## 2022-11-05 DIAGNOSIS — E1040 Type 1 diabetes mellitus with diabetic neuropathy, unspecified: Secondary | ICD-10-CM | POA: Diagnosis not present

## 2022-11-05 DIAGNOSIS — N2581 Secondary hyperparathyroidism of renal origin: Secondary | ICD-10-CM | POA: Diagnosis not present

## 2022-11-05 DIAGNOSIS — Z992 Dependence on renal dialysis: Secondary | ICD-10-CM | POA: Diagnosis not present

## 2022-11-05 DIAGNOSIS — N186 End stage renal disease: Secondary | ICD-10-CM | POA: Diagnosis not present

## 2022-11-07 DIAGNOSIS — N032 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: Secondary | ICD-10-CM | POA: Diagnosis not present

## 2022-11-07 DIAGNOSIS — Z992 Dependence on renal dialysis: Secondary | ICD-10-CM | POA: Diagnosis not present

## 2022-11-07 DIAGNOSIS — N186 End stage renal disease: Secondary | ICD-10-CM | POA: Diagnosis not present

## 2022-11-08 ENCOUNTER — Encounter (HOSPITAL_COMMUNITY): Payer: Self-pay | Admitting: Hematology

## 2022-11-08 DIAGNOSIS — D631 Anemia in chronic kidney disease: Secondary | ICD-10-CM | POA: Diagnosis not present

## 2022-11-08 DIAGNOSIS — N2581 Secondary hyperparathyroidism of renal origin: Secondary | ICD-10-CM | POA: Diagnosis not present

## 2022-11-08 DIAGNOSIS — E1040 Type 1 diabetes mellitus with diabetic neuropathy, unspecified: Secondary | ICD-10-CM | POA: Diagnosis not present

## 2022-11-08 DIAGNOSIS — N186 End stage renal disease: Secondary | ICD-10-CM | POA: Diagnosis not present

## 2022-11-08 DIAGNOSIS — Z992 Dependence on renal dialysis: Secondary | ICD-10-CM | POA: Diagnosis not present

## 2022-11-10 DIAGNOSIS — N186 End stage renal disease: Secondary | ICD-10-CM | POA: Diagnosis not present

## 2022-11-10 DIAGNOSIS — E1040 Type 1 diabetes mellitus with diabetic neuropathy, unspecified: Secondary | ICD-10-CM | POA: Diagnosis not present

## 2022-11-10 DIAGNOSIS — D631 Anemia in chronic kidney disease: Secondary | ICD-10-CM | POA: Diagnosis not present

## 2022-11-10 DIAGNOSIS — Z992 Dependence on renal dialysis: Secondary | ICD-10-CM | POA: Diagnosis not present

## 2022-11-10 DIAGNOSIS — N2581 Secondary hyperparathyroidism of renal origin: Secondary | ICD-10-CM | POA: Diagnosis not present

## 2022-11-12 DIAGNOSIS — E1040 Type 1 diabetes mellitus with diabetic neuropathy, unspecified: Secondary | ICD-10-CM | POA: Diagnosis not present

## 2022-11-12 DIAGNOSIS — Z992 Dependence on renal dialysis: Secondary | ICD-10-CM | POA: Diagnosis not present

## 2022-11-12 DIAGNOSIS — N2581 Secondary hyperparathyroidism of renal origin: Secondary | ICD-10-CM | POA: Diagnosis not present

## 2022-11-12 DIAGNOSIS — N186 End stage renal disease: Secondary | ICD-10-CM | POA: Diagnosis not present

## 2022-11-12 DIAGNOSIS — D631 Anemia in chronic kidney disease: Secondary | ICD-10-CM | POA: Diagnosis not present

## 2022-11-15 DIAGNOSIS — E1040 Type 1 diabetes mellitus with diabetic neuropathy, unspecified: Secondary | ICD-10-CM | POA: Diagnosis not present

## 2022-11-15 DIAGNOSIS — N2581 Secondary hyperparathyroidism of renal origin: Secondary | ICD-10-CM | POA: Diagnosis not present

## 2022-11-15 DIAGNOSIS — Z992 Dependence on renal dialysis: Secondary | ICD-10-CM | POA: Diagnosis not present

## 2022-11-15 DIAGNOSIS — D631 Anemia in chronic kidney disease: Secondary | ICD-10-CM | POA: Diagnosis not present

## 2022-11-15 DIAGNOSIS — N186 End stage renal disease: Secondary | ICD-10-CM | POA: Diagnosis not present

## 2022-11-16 ENCOUNTER — Ambulatory Visit: Payer: 59 | Admitting: Adult Health

## 2022-11-16 ENCOUNTER — Encounter: Payer: Self-pay | Admitting: Adult Health

## 2022-11-16 VITALS — BP 152/92 | HR 91 | Ht 67.0 in | Wt 120.0 lb

## 2022-11-16 DIAGNOSIS — N911 Secondary amenorrhea: Secondary | ICD-10-CM | POA: Diagnosis not present

## 2022-11-16 DIAGNOSIS — E1065 Type 1 diabetes mellitus with hyperglycemia: Secondary | ICD-10-CM

## 2022-11-16 DIAGNOSIS — I10 Essential (primary) hypertension: Secondary | ICD-10-CM | POA: Diagnosis not present

## 2022-11-16 DIAGNOSIS — N049 Nephrotic syndrome with unspecified morphologic changes: Secondary | ICD-10-CM | POA: Diagnosis not present

## 2022-11-16 MED ORDER — MEDROXYPROGESTERONE ACETATE 10 MG PO TABS: ORAL_TABLET | ORAL | 2 refills | Status: DC

## 2022-11-16 NOTE — Progress Notes (Signed)
  Subjective:     Patient ID: Lindsey Murillo, female   DOB: 05-05-1996, 27 y.o.   MRN: [id number]  HPI Lindsey Murillo is a 27 year old black female,single, G0P0, back in follow up on taking provera 10 mg for 10 days to get withdrawal bleed, and she started period July 2,2024 for about 4 days. It had been years since having a period. She is waiting on kidney transplant.     Component Value Date/Time   DIAGPAP  10/26/2022 1021    - Negative for intraepithelial lesion or malignancy (NILM)   HPVHIGH Negative 10/26/2022 1021   ADEQPAP  10/26/2022 1021    Satisfactory for evaluation; transformation zone component ABSENT.    PCP is Dr Adriana Simas.  Review of Systems Had period July 2 x 4 days, after taking provera Reviewed past medical,surgical, social and family history. Reviewed medications and allergies.     Objective:   Physical Exam BP (!) 152/92 (BP Location: Left Arm, Patient Position: Sitting, Cuff Size: Normal)   Pulse 91   Ht 5\' 7"  (1.702 m)   Wt 120 lb (54.4 kg)   LMP 11/09/2022 (Approximate)   BMI 18.79 kg/m  Skin warm and dry. Lungs: clear to ausculation bilaterally. Cardiovascular: regular rate and rhythm.      Upstream - 11/16/22 1105       Pregnancy Intention Screening   Does the patient want to become pregnant in the next year? No    Does the patient's partner want to become pregnant in the next year? No    Would the patient like to discuss contraceptive options today? No      Contraception Wrap Up   Current Method Abstinence    End Method Abstinence             Assessment:      1. Amenorrhea, secondary Had period July 2 x 4 days Will cycle with provera every 3 months, discussed with Dr Despina Hidden Meds ordered this encounter  Medications   medroxyPROGESTERone (PROVERA) 10 MG tablet    Sig: Take provera 10 mg for 10 days every 3 months    Dispense:  30 tablet    Refill:  2    Order Specific Question:   Supervising Provider    Answer:   Despina Hidden, LUTHER H [[id number]]      2. Nephrotic syndrome On dialysis, awaiting kidney transplant  3. Uncontrolled type 1 diabetes mellitus with hyperglycemia ([name])  4. Essential hypertension, benign Follow up with PCP    Plan:     Follow up in 3 months for ROS

## 2022-11-17 DIAGNOSIS — E1040 Type 1 diabetes mellitus with diabetic neuropathy, unspecified: Secondary | ICD-10-CM | POA: Diagnosis not present

## 2022-11-17 DIAGNOSIS — Z992 Dependence on renal dialysis: Secondary | ICD-10-CM | POA: Diagnosis not present

## 2022-11-17 DIAGNOSIS — N2581 Secondary hyperparathyroidism of renal origin: Secondary | ICD-10-CM | POA: Diagnosis not present

## 2022-11-17 DIAGNOSIS — D631 Anemia in chronic kidney disease: Secondary | ICD-10-CM | POA: Diagnosis not present

## 2022-11-17 DIAGNOSIS — N186 End stage renal disease: Secondary | ICD-10-CM | POA: Diagnosis not present

## 2022-11-19 DIAGNOSIS — N2581 Secondary hyperparathyroidism of renal origin: Secondary | ICD-10-CM | POA: Diagnosis not present

## 2022-11-19 DIAGNOSIS — D631 Anemia in chronic kidney disease: Secondary | ICD-10-CM | POA: Diagnosis not present

## 2022-11-19 DIAGNOSIS — Z992 Dependence on renal dialysis: Secondary | ICD-10-CM | POA: Diagnosis not present

## 2022-11-19 DIAGNOSIS — E1040 Type 1 diabetes mellitus with diabetic neuropathy, unspecified: Secondary | ICD-10-CM | POA: Diagnosis not present

## 2022-11-19 DIAGNOSIS — N186 End stage renal disease: Secondary | ICD-10-CM | POA: Diagnosis not present

## 2022-11-22 DIAGNOSIS — Z992 Dependence on renal dialysis: Secondary | ICD-10-CM | POA: Diagnosis not present

## 2022-11-22 DIAGNOSIS — N186 End stage renal disease: Secondary | ICD-10-CM | POA: Diagnosis not present

## 2022-11-22 DIAGNOSIS — E1040 Type 1 diabetes mellitus with diabetic neuropathy, unspecified: Secondary | ICD-10-CM | POA: Diagnosis not present

## 2022-11-22 DIAGNOSIS — N2581 Secondary hyperparathyroidism of renal origin: Secondary | ICD-10-CM | POA: Diagnosis not present

## 2022-11-22 DIAGNOSIS — D631 Anemia in chronic kidney disease: Secondary | ICD-10-CM | POA: Diagnosis not present

## 2022-11-23 ENCOUNTER — Ambulatory Visit (INDEPENDENT_AMBULATORY_CARE_PROVIDER_SITE_OTHER): Payer: 59 | Admitting: Podiatry

## 2022-11-23 DIAGNOSIS — L97511 Non-pressure chronic ulcer of other part of right foot limited to breakdown of skin: Secondary | ICD-10-CM | POA: Diagnosis not present

## 2022-11-23 DIAGNOSIS — Z7682 Awaiting organ transplant status: Secondary | ICD-10-CM | POA: Diagnosis not present

## 2022-11-23 DIAGNOSIS — Z008 Encounter for other general examination: Secondary | ICD-10-CM | POA: Diagnosis not present

## 2022-11-23 NOTE — Progress Notes (Signed)
Chief Complaint  Patient presents with   Foot Ulcer    Ulcer on the right foot.     HPI: 27 y.o. female presenting today for follow-up of ulceration right submet 1.  States that there has been no drainage.  She does not have her regular shoe with her today.  She is in the offloading surgical shoe.  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 10/21/2021   Diabetic autonomic neuropathy ([name])    Diabetic peripheral neuropathy ([name])    Environmental allergies    Goiter    History of blood transfusion    Hypertension    no meds currently   Hypoglycemia associated with diabetes ([name])    Musculoskeletal pain 04/20/2022   Tachycardia    Thyroiditis, autoimmune    Type 1 diabetes mellitus in patient age 32-19 years with HbA1C goal below 7.5     Past Surgical History:  Procedure Laterality Date   AV FISTULA PLACEMENT Right 12/08/2021   Procedure: ARTERIOVENOUS  FISTULA CREATION VERSUS GRAFT;  Surgeon: Larina Earthly, MD;  Location: [facility];  Service: Vascular;  Laterality: Right;   BASCILIC VEIN TRANSPOSITION Right 01/12/2022   Procedure: RIGHT ARM SECOND STAGE BASILIC VEIN TRANSPOSITION;  Surgeon: Larina Earthly, MD;  Location: [facility];  Service: Vascular;  Laterality: Right;   BIOPSY  08/27/2016   Procedure: BIOPSY;  Surgeon: West Bali, MD;  [facility];  Service: Endoscopy;;  duodenum; gastric   BIOPSY  06/27/2019   Procedure: BIOPSY;  Surgeon: Corbin Ade, MD;  [facility];  Service: Endoscopy;;   BIOPSY  02/09/2022   Procedure: BIOPSY;  Surgeon: Lanelle Bal, DO;  [facility];  Service: Endoscopy;;   BREAST CYST EXCISION Right 11/12/2021   Procedure: RIGHT BREAST ABSCESS INCISION AND DRAINAGE;   Surgeon: Emelia Loron, MD;  Location: [facility] OR;  Service: General;  Laterality: Right;  LMA   COLONOSCOPY     COLONOSCOPY WITH PROPOFOL N/A 02/09/2022   Procedure: COLONOSCOPY WITH PROPOFOL;  Surgeon: Lanelle Bal, DO;  [facility];  Service: Endoscopy;  Laterality: N/A;  7:30am, asa 3, dialysis pt   ESOPHAGOGASTRODUODENOSCOPY N/A 08/27/2016   Dr. Darrick Penna: mild gastritis. Negative celiac. No obvious source for dyspepsia/diarrhea   ESOPHAGOGASTRODUODENOSCOPY (EGD) WITH PROPOFOL N/A 06/27/2019   rourk: Focal abnormality of the gastric mucosa likely due to trauma (heaving).  Biopsy showed mild gastritis, negative for H. pylori.  Esophageal dilation for history of dysphagia but normal-appearing esophagus.   ESOPHAGOGASTRODUODENOSCOPY (EGD) WITH PROPOFOL N/A 02/09/2022   Procedure: ESOPHAGOGASTRODUODENOSCOPY (EGD) WITH PROPOFOL;  Surgeon: Lanelle Bal, DO;  [facility];  Service: Endoscopy;  Laterality: N/A;   IR FLUORO GUIDE CV LINE RIGHT  10/13/2021   IR FLUORO GUIDE CV LINE RIGHT  10/19/2021   IR US GUIDE VASC ACCESS RIGHT  10/13/2021   IRRIGATION AND DEBRIDEMENT ABSCESS N/A 10/04/2021   Procedure: IRRIGATION AND DEBRIDEMENT NECK ABSCESS;  Surgeon: Axel Filler, MD;  Location: [facility] OR;  Service: General;  Laterality: N/A;    No Known Allergies  Smoker?: non-smoker   PHYSICAL EXAM: There were no vitals filed for this visit.  General: The patient is alert and oriented x3 in  no acute distress.  Dermatology: Skin is warm, dry and supple bilateral lower extremities. Interspaces are clear of maceration and debris.      Wound 1 description:  Location: Right submet 1    Depth: Closed, callused only today    Wound Border: Hemorrhagic callus    Odor?:  None    Surrounding Tissue: No erythema or edema    Infected?:  No    Necrosis?:  No    Pain?:  None    Tunneling: None    Dimensions (cm): N/A  Vascular: Pedal pulses are palpable  Neurological: Light touch  sensation grossly intact bilateral feet.   Musculoskeletal Exam: Plantarflexed first metatarsal     Latest Ref Rng & Units 07/20/2022    2:40 PM 01/21/2022   11:15 AM  Hemoglobin A1C  Hemoglobin-A1c 4.8 - 5.6 % 10.5  8.3    ASSESSMENT / PLAN OF CARE: 1. Ulcer of right foot, limited to breakdown of skin ([name])     The recently closed ulceration was sharply debrided of hyperkeratotic tissue with sterile #312 blade to the level of epidermis.    Patient was instructed to follow-up in 5 weeks for an ulcer recheck.  At that time she was instructed to bring her shoes at this visit so that we can place an offloading pad underneath her shoe insole to keep pressure away to prevent recurrence of the ulcer.  Discussed risks / concerns regarding ulcer with patient and possible sequelae if left untreated.  Stressed importance of infection prevention at home. Short-term goals are:  resolve infection, off-load ulcer, heal ulcer Long-term goals are:  prevent recurrence, prevent amputation.   Return in about 5 weeks (around 12/28/2022) for f/u ulcer.   Clerance Lav, DPM, FACFAS [facility]     [address] [place], [place] [address]                Office [phone]  Fax [phone]

## 2022-11-24 DIAGNOSIS — Z992 Dependence on renal dialysis: Secondary | ICD-10-CM | POA: Diagnosis not present

## 2022-11-24 DIAGNOSIS — D631 Anemia in chronic kidney disease: Secondary | ICD-10-CM | POA: Diagnosis not present

## 2022-11-24 DIAGNOSIS — E1040 Type 1 diabetes mellitus with diabetic neuropathy, unspecified: Secondary | ICD-10-CM | POA: Diagnosis not present

## 2022-11-24 DIAGNOSIS — N2581 Secondary hyperparathyroidism of renal origin: Secondary | ICD-10-CM | POA: Diagnosis not present

## 2022-11-24 DIAGNOSIS — N186 End stage renal disease: Secondary | ICD-10-CM | POA: Diagnosis not present

## 2022-11-26 DIAGNOSIS — E1040 Type 1 diabetes mellitus with diabetic neuropathy, unspecified: Secondary | ICD-10-CM | POA: Diagnosis not present

## 2022-11-26 DIAGNOSIS — D631 Anemia in chronic kidney disease: Secondary | ICD-10-CM | POA: Diagnosis not present

## 2022-11-26 DIAGNOSIS — Z992 Dependence on renal dialysis: Secondary | ICD-10-CM | POA: Diagnosis not present

## 2022-11-26 DIAGNOSIS — N186 End stage renal disease: Secondary | ICD-10-CM | POA: Diagnosis not present

## 2022-11-26 DIAGNOSIS — N2581 Secondary hyperparathyroidism of renal origin: Secondary | ICD-10-CM | POA: Diagnosis not present

## 2022-11-29 DIAGNOSIS — N186 End stage renal disease: Secondary | ICD-10-CM | POA: Diagnosis not present

## 2022-11-29 DIAGNOSIS — Z992 Dependence on renal dialysis: Secondary | ICD-10-CM | POA: Diagnosis not present

## 2022-11-29 DIAGNOSIS — E1040 Type 1 diabetes mellitus with diabetic neuropathy, unspecified: Secondary | ICD-10-CM | POA: Diagnosis not present

## 2022-11-29 DIAGNOSIS — D631 Anemia in chronic kidney disease: Secondary | ICD-10-CM | POA: Diagnosis not present

## 2022-11-29 DIAGNOSIS — N2581 Secondary hyperparathyroidism of renal origin: Secondary | ICD-10-CM | POA: Diagnosis not present

## 2022-12-01 ENCOUNTER — Encounter (HOSPITAL_COMMUNITY): Payer: Self-pay | Admitting: Emergency Medicine

## 2022-12-01 ENCOUNTER — Emergency Department (HOSPITAL_COMMUNITY): Payer: 59

## 2022-12-01 ENCOUNTER — Other Ambulatory Visit: Payer: Self-pay

## 2022-12-01 ENCOUNTER — Emergency Department (HOSPITAL_COMMUNITY)
Admission: EM | Admit: 2022-12-01 | Discharge: 2022-12-01 | Disposition: A | Discharge status: Home / Self Care | Payer: 59 | Attending: Emergency Medicine | Admitting: Emergency Medicine

## 2022-12-01 DIAGNOSIS — K529 Noninfective gastroenteritis and colitis, unspecified: Secondary | ICD-10-CM | POA: Insufficient documentation

## 2022-12-01 DIAGNOSIS — N189 Chronic kidney disease, unspecified: Secondary | ICD-10-CM | POA: Diagnosis not present

## 2022-12-01 DIAGNOSIS — K861 Other chronic pancreatitis: Secondary | ICD-10-CM | POA: Diagnosis not present

## 2022-12-01 DIAGNOSIS — R000 Tachycardia, unspecified: Secondary | ICD-10-CM | POA: Insufficient documentation

## 2022-12-01 DIAGNOSIS — Z992 Dependence on renal dialysis: Secondary | ICD-10-CM | POA: Insufficient documentation

## 2022-12-01 DIAGNOSIS — M546 Pain in thoracic spine: Secondary | ICD-10-CM | POA: Diagnosis not present

## 2022-12-01 DIAGNOSIS — R7401 Elevation of levels of liver transaminase levels: Secondary | ICD-10-CM | POA: Insufficient documentation

## 2022-12-01 DIAGNOSIS — R197 Diarrhea, unspecified: Secondary | ICD-10-CM | POA: Diagnosis present

## 2022-12-01 DIAGNOSIS — Z794 Long term (current) use of insulin: Secondary | ICD-10-CM | POA: Diagnosis not present

## 2022-12-01 DIAGNOSIS — K828 Other specified diseases of gallbladder: Secondary | ICD-10-CM | POA: Diagnosis not present

## 2022-12-01 DIAGNOSIS — I129 Hypertensive chronic kidney disease with stage 1 through stage 4 chronic kidney disease, or unspecified chronic kidney disease: Secondary | ICD-10-CM | POA: Diagnosis not present

## 2022-12-01 DIAGNOSIS — R109 Unspecified abdominal pain: Secondary | ICD-10-CM | POA: Diagnosis not present

## 2022-12-01 DIAGNOSIS — N3289 Other specified disorders of bladder: Secondary | ICD-10-CM | POA: Diagnosis not present

## 2022-12-01 DIAGNOSIS — E1122 Type 2 diabetes mellitus with diabetic chronic kidney disease: Secondary | ICD-10-CM | POA: Insufficient documentation

## 2022-12-01 DIAGNOSIS — K8689 Other specified diseases of pancreas: Secondary | ICD-10-CM | POA: Diagnosis not present

## 2022-12-01 LAB — HCG, SERUM, QUALITATIVE: Preg, Serum: NEGATIVE

## 2022-12-01 LAB — CBC
HCT: 39.4 % (ref 36.0–46.0)
Hemoglobin: 12.9 g/dL (ref 12.0–15.0)
MCH: 28.9 pg (ref 26.0–34.0)
MCHC: 32.7 g/dL (ref 30.0–36.0)
MCV: 88.1 fL (ref 80.0–100.0)
Platelets: 116 10*3/uL — ABNORMAL LOW (ref 150–400)
RBC: 4.47 MIL/uL (ref 3.87–5.11)
RDW: 18.8 % — ABNORMAL HIGH (ref 11.5–15.5)
WBC: 7.6 10*3/uL (ref 4.0–10.5)
nRBC: 0 % (ref 0.0–0.2)

## 2022-12-01 LAB — BLOOD GAS, VENOUS
Acid-Base Excess: 6.2 mmol/L — ABNORMAL HIGH (ref 0.0–2.0)
Bicarbonate: 30.6 mmol/L — ABNORMAL HIGH (ref 20.0–28.0)
Drawn by: 3508
O2 Saturation: 57.5 %
Patient temperature: 36.5
pCO2, Ven: 41 mmHg — ABNORMAL LOW (ref 44–60)
pH, Ven: 7.48 — ABNORMAL HIGH (ref 7.25–7.43)
pO2, Ven: 31 mmHg — CL (ref 32–45)

## 2022-12-01 LAB — COMPREHENSIVE METABOLIC PANEL
ALT: 82 U/L — ABNORMAL HIGH (ref 0–44)
AST: 54 U/L — ABNORMAL HIGH (ref 15–41)
Albumin: 3.8 g/dL (ref 3.5–5.0)
Alkaline Phosphatase: 315 U/L — ABNORMAL HIGH (ref 38–126)
Anion gap: 16 — ABNORMAL HIGH (ref 5–15)
BUN: 45 mg/dL — ABNORMAL HIGH (ref 6–20)
CO2: 24 mmol/L (ref 22–32)
Calcium: 8.6 mg/dL — ABNORMAL LOW (ref 8.9–10.3)
Chloride: 94 mmol/L — ABNORMAL LOW (ref 98–111)
Creatinine, Ser: 5.12 mg/dL — ABNORMAL HIGH (ref 0.44–1.00)
GFR, Estimated: 11 mL/min — ABNORMAL LOW (ref >60)
Glucose, Bld: 170 mg/dL — ABNORMAL HIGH (ref 70–99)
Potassium: 3.8 mmol/L (ref 3.5–5.1)
Sodium: 134 mmol/L — ABNORMAL LOW (ref 135–145)
Total Bilirubin: 1.4 mg/dL — ABNORMAL HIGH (ref 0.3–1.2)
Total Protein: 7.9 g/dL (ref 6.5–8.1)

## 2022-12-01 LAB — LIPASE, BLOOD: Lipase: 38 U/L (ref 11–51)

## 2022-12-01 LAB — CBG MONITORING, ED: Glucose-Capillary: 179 mg/dL — ABNORMAL HIGH (ref 70–99)

## 2022-12-01 MED ORDER — CEPHALEXIN 250 MG PO CAPS: 250.0000 mg | ORAL_CAPSULE | Freq: Two times a day (BID) | ORAL | 0 refills | Status: AC

## 2022-12-01 MED ORDER — ONDANSETRON 4 MG PO TBDP: ORAL_TABLET | ORAL | 0 refills | Status: DC

## 2022-12-01 MED ORDER — SODIUM CHLORIDE 0.9 % IV BOLUS
500.0000 mL | Freq: Once | INTRAVENOUS | Status: AC
  Administered 2022-12-01: 500 mL via INTRAVENOUS

## 2022-12-01 MED ORDER — MORPHINE SULFATE (PF) 4 MG/ML IV SOLN
4.0000 mg | Freq: Once | INTRAVENOUS | Status: AC
  Administered 2022-12-01: 4 mg via INTRAVENOUS
  Filled 2022-12-01: qty 1

## 2022-12-01 MED ORDER — ONDANSETRON HCL 4 MG/2ML IJ SOLN
4.0000 mg | Freq: Once | INTRAMUSCULAR | Status: AC
  Administered 2022-12-01: 4 mg via INTRAVENOUS
  Filled 2022-12-01: qty 2

## 2022-12-01 NOTE — ED Notes (Signed)
Patient transported to CT 

## 2022-12-01 NOTE — ED Notes (Signed)
Pt returned from CT °

## 2022-12-01 NOTE — ED Notes (Signed)
US in room 

## 2022-12-01 NOTE — ED Triage Notes (Signed)
Pt reports left sided mid back pain that started 2 days ago. Pt also reports n/v/d. Denies fevers.

## 2022-12-01 NOTE — ED Notes (Signed)
Pt c/o n/v/d x 2 days. Vomited x 3 in last 24 hrs. Diarrhea "a lot" in last 24 hrs. Pt c/o mid/left lower back pain x 2 days as well. Mm moist. A/o. Fistula to [facility] thrills felt.

## 2022-12-01 NOTE — Discharge Instructions (Addendum)
Take the medications as prescribed for nausea and vomiting.  You can take over-the-counter Tylenol for pain.  The CAT scan suggested the possibility of a bladder infection although we were not able to correlate that with a urine sample.  Take the antibiotics as prescribed.  Follow-up with your doctor to be rechecked

## 2022-12-01 NOTE — ED Notes (Signed)
Pt placed on cardiac monitor 

## 2022-12-01 NOTE — ED Notes (Signed)
ED Provider at bedside. 

## 2022-12-01 NOTE — ED Provider Notes (Signed)
[place] EMERGENCY DEPARTMENT AT [facility] Provider Note   CSN: [id number] Arrival date & time: 12/01/22  7829     History  Chief Complaint  Patient presents with   Back Pain   Emesis   Diarrhea    Lindsey Murillo is a 27 y.o. female.   Back Pain Emesis Associated symptoms: diarrhea   Diarrhea Associated symptoms: vomiting      Patient has a history of diabetes, hypoglycemia, chronic kidney disease on dialysis.  Patient states she goes to dialysis Monday Wednesday Friday.  She last went Monday.  She was feeling ill today so rescheduled for tomorrow.  Patient states yesterday she began having issues with vomiting and diarrhea.  Patient states she has had a few episodes of vomiting and multiple episodes of diarrhea.  She is also having pain in her mid back.  She denies any recent falls or injuries.  Patient denies any abdominal surgeries in the past  Home Medications Prior to Admission medications   Medication Sig Start Date End Date Taking? Authorizing Provider  acetaminophen (TYLENOL) 325 MG tablet Take by mouth. 12/24/21   [provider]  albuterol (VENTOLIN HFA) 108 (90 Base) MCG/ACT inhaler Inhale into the lungs. 08/05/22   Marcelyn Bruins, MD  Blood Glucose Monitoring Suppl (ACCU-CHEK GUIDE ME) w/Device KIT 1 Piece by Does not apply route as directed. 01/21/22   Roma Kayser, MD  cephALEXin (KEFLEX) 250 MG capsule Take 1 capsule (250 mg total) by mouth 2 (two) times daily for 5 days. 12/01/22 12/06/22 Yes Linwood Dibbles, MD  Continuous Glucose Receiver (DEXCOM G7 RECEIVER) DEVI by Does not apply route.    [provider]  Continuous Glucose Sensor (DEXCOM G7 SENSOR) MISC by Does not apply route.    [provider]  Fluticasone Furoate (ARNUITY ELLIPTA) 100 MCG/ACT AEPB Inhale 1 Dose into the lungs daily. 08/05/22   Marcelyn Bruins, MD  glucose blood (ACCU-CHEK GUIDE) test strip Use to monitor glucose 4 times a  day as instructed 01/21/22   Roma Kayser, MD  insulin aspart (FIASP FLEXTOUCH) 100 UNIT/ML FlexTouch Pen Inject 4-7 Units into the skin 3 (three) times daily before meals. 09/21/22   Roma Kayser, MD  Insulin Glargine (BASAGLAR KWIKPEN) 100 UNIT/ML Inject 14 Units into the skin at bedtime. 09/21/22   Roma Kayser, MD  Insulin Pen Needle 31G X 4 MM MISC Use as directed to administer insulin. 10/22/21   Rodolph Bong, MD  medroxyPROGESTERone (PROVERA) 10 MG tablet Take provera 10 mg for 10 days every 3 months 11/16/22   Adline Potter, NP  Methoxy PEG-Epoetin Beta (MIRCERA IJ) Mircera 11/01/22 10/31/23  [provider]  ondansetron (ZOFRAN-ODT) 4 MG disintegrating tablet 4mg  ODT q4 hours prn nausea/vomit 12/01/22   Linwood Dibbles, MD  oxyCODONE (OXY IR/ROXICODONE) 5 MG immediate release tablet  10/22/21   [provider]  pantoprazole (PROTONIX) 40 MG tablet Take 1 tablet (40 mg total) by mouth 2 (two) times daily before a meal. 09/16/22 09/11/23  Aida Raider, NP  rosuvastatin (CRESTOR) 10 MG tablet Take 1 tablet (10 mg total) by mouth daily. 07/20/22   [name], DO  sucroferric oxyhydroxide (VELPHORO) 500 MG chewable tablet Chew 500 mg by mouth 3 (three) times daily with meals. 11/16/21   [provider]  Vitamin D, Ergocalciferol, (DRISDOL) 1.25 MG (50000 UNIT) CAPS capsule Take 50,000 Units by mouth every 7 (seven) days.    [provider]  Allergies    Patient has no known allergies.    Review of Systems   Review of Systems  Gastrointestinal:  Positive for diarrhea and vomiting.  Musculoskeletal:  Positive for back pain.    Physical Exam Updated Vital Signs BP (!) 165/96   Pulse 84   Temp 97.7 F (36.5 C) (Oral)   Resp 12   Ht 1.727 m (5\' 8" )   Wt 54.4 kg   LMP 11/09/2022 (Approximate)   SpO2 99%   BMI 18.25 kg/m  Physical Exam Vitals and nursing note reviewed.  Constitutional:      General: She is not in  acute distress.    Appearance: She is well-developed. She is ill-appearing.  HENT:     Head: Normocephalic and atraumatic.     Right Ear: External ear normal.     Left Ear: External ear normal.  Eyes:     General: No scleral icterus.       Right eye: No discharge.        Left eye: No discharge.     Conjunctiva/sclera: Conjunctivae normal.  Neck:     Trachea: No tracheal deviation.  Cardiovascular:     Rate and Rhythm: Regular rhythm. Tachycardia present.  Pulmonary:     Effort: Pulmonary effort is normal. No respiratory distress.     Breath sounds: Normal breath sounds. No stridor. No wheezing or rales.  Abdominal:     General: Bowel sounds are normal. There is no distension.     Palpations: Abdomen is soft.     Tenderness: There is abdominal tenderness in the right upper quadrant and epigastric area. There is no guarding or rebound.  Musculoskeletal:        General: No tenderness or deformity.     Cervical back: Neck supple.  Skin:    General: Skin is warm and dry.     Findings: No rash.  Neurological:     General: No focal deficit present.     Mental Status: She is alert.     Cranial Nerves: No cranial nerve deficit, dysarthria or facial asymmetry.     Sensory: No sensory deficit.     Motor: No abnormal muscle tone or seizure activity.     Coordination: Coordination normal.  Psychiatric:        Mood and Affect: Mood normal.     ED Results / Procedures / Treatments   Labs (all labs ordered are listed, but only abnormal results are displayed) Labs Reviewed  COMPREHENSIVE METABOLIC PANEL - Abnormal; Notable for the following components:      Result Value   Sodium 134 (*)    Chloride 94 (*)    Glucose, Bld 170 (*)    BUN 45 (*)    Creatinine, Ser 5.12 (*)    Calcium 8.6 (*)    AST 54 (*)    ALT 82 (*)    Alkaline Phosphatase 315 (*)    Total Bilirubin 1.4 (*)    GFR, Estimated 11 (*)    Anion gap 16 (*)    All other components within normal limits  CBC -  Abnormal; Notable for the following components:   RDW 18.8 (*)    Platelets 116 (*)    All other components within normal limits  BLOOD GAS, VENOUS - Abnormal; Notable for the following components:   pH, Ven 7.48 (*)    pCO2, Ven 41 (*)    pO2, Ven 31 (*)    Bicarbonate 30.6 (*)  Acid-Base Excess 6.2 (*)    All other components within normal limits  CBG MONITORING, ED - Abnormal; Notable for the following components:   Glucose-Capillary 179 (*)    All other components within normal limits  LIPASE, BLOOD  HCG, SERUM, QUALITATIVE    EKG None  Radiology CT ABDOMEN PELVIS WO CONTRAST  Result Date: 12/01/2022 CLINICAL DATA:  Nausea, vomiting common diarrhea for 2 days. EXAM: CT ABDOMEN AND PELVIS WITHOUT CONTRAST TECHNIQUE: Multidetector CT imaging of the abdomen and pelvis was performed following the standard protocol without IV contrast. RADIATION DOSE REDUCTION: This exam was performed according to the departmental dose-optimization program which includes automated exposure control, adjustment of the mA and/or kV according to patient size and/or use of iterative reconstruction technique. COMPARISON:  Same-day right upper quadrant ultrasound, CT abdomen/pelvis 05/27/2022 FINDINGS: Lower chest: The lung bases are clear Hepatobiliary: Linear opacities in the left lung base are similar to the prior study consistent with scar. The imaged heart is unremarkable. Pancreas: The liver and gallbladder are unremarkable. There is no biliary ductal dilatation. Spleen: The pancreas is atrophic with scattered parenchymal calcifications consistent with sequela of chronic pancreatitis, unchanged. Adrenals/Urinary Tract: The adrenals are unremarkable. The kidneys are unremarkable, with no focal lesion, stone, hydronephrosis, or hydroureter. The bladder wall is diffusely thickened, similar to the prior study. Stomach/Bowel: There is enteric contrast in the distal esophagus. The stomach is unremarkable. There is  no evidence of bowel obstruction. There is no definite abnormal bowel wall thickening or inflammatory change. The appendix is not definitively identified. Vascular/Lymphatic: The abdominal aorta is normal in course and caliber with scattered calcified plaque. There is no abdominopelvic lymphadenopathy. Reproductive: The uterus and adnexa are unremarkable. Other: There is no significant abdominopelvic ascites. Musculoskeletal: There is no acute osseous abnormality or suspicious osseous lesion. IMPRESSION: 1. Enteric contrast in the distal esophagus suggestive of reflux. 2. Diffuse bladder wall thickening is similar to the prior study and may reflect cystitis. Correlate with urinalysis. 3. Sequela of chronic pancreatitis. Electronically Signed   By: Lesia Hausen M.D.   On: 12/01/2022 15:32   US Abdomen Limited RUQ (LIVER/GB)  Result Date: 12/01/2022 CLINICAL DATA:  Abdominal pain EXAM: ULTRASOUND ABDOMEN LIMITED RIGHT UPPER QUADRANT COMPARISON:  CT 05/27/2018 FINDINGS: Gallbladder: Gallbladder is distended.  No shadowing stones.  No adjacent fluid Common bile duct: Diameter: 3 mm Liver: No focal lesion identified. Within normal limits in parenchymal echogenicity. Portal vein is patent on color Doppler imaging with normal direction of blood flow towards the liver. Other: None. IMPRESSION: Distended gallbladder.  No shadowing stones.  Common duct 5 mm. Electronically Signed   By: Karen Kays M.D.   On: 12/01/2022 11:57    Procedures Procedures    Medications Ordered in ED Medications  sodium chloride 0.9 % bolus 500 mL (0 mLs Intravenous Stopped 12/01/22 1113)  morphine (PF) 4 MG/ML injection 4 mg (4 mg Intravenous Given 12/01/22 1051)  ondansetron (ZOFRAN) injection 4 mg (4 mg Intravenous Given 12/01/22 1050)    ED Course/ Medical Decision Making/ A&P Clinical Course as of 12/01/22 1559  Wed Dec 01, 2022  1109 VBG does not show any signs of acidosis.  Metabolic panel does show increased AST ALT alk  phos and bilirubin.  Abnormal BUN and creatinine consistent with chronic kidney disease [JK]  1203 Ultrasound shows distended gallbladder, no evidence of gallstones [JK]  1538 CT scan shows bladder wall thickening could correlate with cystitis.  Similar to previous findings.  Patient has contrast in  the stool esophagus suggestive of reflux, sequela of chronic pancreatitis but no acute findings. [JK]    Clinical Course User Index [JK] Linwood Dibbles, MD                             Medical Decision Making Differential diagnosis includes but not limited to appendicitis, bowel obstruction, colitis, cholecystitis, pancreatitis or DKA  Problems Addressed: Chronic kidney disease, unspecified CKD stage: chronic illness or injury with exacerbation, progression, or side effects of treatment Gastroenteritis: acute illness or injury that poses a threat to life or bodily functions  Amount and/or Complexity of Data Reviewed Labs: ordered. Radiology: ordered.  Risk Prescription drug management.   Patient's ED workup is reassuring.  Ultrasound did not show any evidence of cholecystitis.  No choledocholithiasis.  CT scan did not show any acute abnormality.  No evidence of appendicitis or bowel obstruction.  There is question of bladder wall thickening.  Patient however is not able to give [place] urine sample to correlate.  Discussed options of having her try to follow-up with her doctor to obtain a urine sample versus treating empirically.  Will go ahead and give a short course of antibiotics as I cannot exclude a urinary tract infection.  Patient was notably tachycardic when she arrived but that has improved with IV hydration.  I suspect there is a component of dehydration from her vomiting and diarrhea.  Patient is feeling better no recurrent vomiting.  She feels well enough to go home  Patient does have chronic kidney disease she is.  She did not go to dialysis today but will be going tomorrow.  No signs of  fluid overload.  In fact think she presented dehydrated initially.  Evaluation and diagnostic testing in the emergency department does not suggest an emergent condition requiring admission or immediate intervention beyond what has been performed at this time.  The patient is safe for discharge and has been instructed to return immediately for worsening symptoms, change in symptoms or any other concerns.        Final Clinical Impression(s) / ED Diagnoses Final diagnoses:  Gastroenteritis  Chronic kidney disease, unspecified CKD stage    Rx / DC Orders ED Discharge Orders          Ordered    cephALEXin (KEFLEX) 250 MG capsule  2 times daily        12/01/22 1557    ondansetron (ZOFRAN-ODT) 4 MG disintegrating tablet        12/01/22 1557              Linwood Dibbles, MD 12/01/22 1559

## 2022-12-02 ENCOUNTER — Encounter (HOSPITAL_COMMUNITY): Payer: Self-pay | Admitting: Hematology

## 2022-12-02 DIAGNOSIS — E1040 Type 1 diabetes mellitus with diabetic neuropathy, unspecified: Secondary | ICD-10-CM | POA: Diagnosis not present

## 2022-12-02 DIAGNOSIS — N2581 Secondary hyperparathyroidism of renal origin: Secondary | ICD-10-CM | POA: Diagnosis not present

## 2022-12-02 DIAGNOSIS — N186 End stage renal disease: Secondary | ICD-10-CM | POA: Diagnosis not present

## 2022-12-02 DIAGNOSIS — Z992 Dependence on renal dialysis: Secondary | ICD-10-CM | POA: Diagnosis not present

## 2022-12-02 DIAGNOSIS — D631 Anemia in chronic kidney disease: Secondary | ICD-10-CM | POA: Diagnosis not present

## 2022-12-03 DIAGNOSIS — E1040 Type 1 diabetes mellitus with diabetic neuropathy, unspecified: Secondary | ICD-10-CM | POA: Diagnosis not present

## 2022-12-03 DIAGNOSIS — D631 Anemia in chronic kidney disease: Secondary | ICD-10-CM | POA: Diagnosis not present

## 2022-12-03 DIAGNOSIS — N186 End stage renal disease: Secondary | ICD-10-CM | POA: Diagnosis not present

## 2022-12-03 DIAGNOSIS — N2581 Secondary hyperparathyroidism of renal origin: Secondary | ICD-10-CM | POA: Diagnosis not present

## 2022-12-03 DIAGNOSIS — Z992 Dependence on renal dialysis: Secondary | ICD-10-CM | POA: Diagnosis not present

## 2022-12-06 DIAGNOSIS — N186 End stage renal disease: Secondary | ICD-10-CM | POA: Diagnosis not present

## 2022-12-06 DIAGNOSIS — D631 Anemia in chronic kidney disease: Secondary | ICD-10-CM | POA: Diagnosis not present

## 2022-12-06 DIAGNOSIS — E1040 Type 1 diabetes mellitus with diabetic neuropathy, unspecified: Secondary | ICD-10-CM | POA: Diagnosis not present

## 2022-12-06 DIAGNOSIS — Z992 Dependence on renal dialysis: Secondary | ICD-10-CM | POA: Diagnosis not present

## 2022-12-06 DIAGNOSIS — N2581 Secondary hyperparathyroidism of renal origin: Secondary | ICD-10-CM | POA: Diagnosis not present

## 2022-12-07 ENCOUNTER — Telehealth: Payer: Self-pay | Admitting: *Deleted

## 2022-12-07 DIAGNOSIS — D631 Anemia in chronic kidney disease: Secondary | ICD-10-CM | POA: Diagnosis not present

## 2022-12-07 DIAGNOSIS — N2581 Secondary hyperparathyroidism of renal origin: Secondary | ICD-10-CM | POA: Diagnosis not present

## 2022-12-07 DIAGNOSIS — E1040 Type 1 diabetes mellitus with diabetic neuropathy, unspecified: Secondary | ICD-10-CM | POA: Diagnosis not present

## 2022-12-07 DIAGNOSIS — N186 End stage renal disease: Secondary | ICD-10-CM | POA: Diagnosis not present

## 2022-12-07 DIAGNOSIS — Z992 Dependence on renal dialysis: Secondary | ICD-10-CM | POA: Diagnosis not present

## 2022-12-07 NOTE — Telephone Encounter (Signed)
Transition Care Management Unsuccessful Follow-up Telephone Call  Date of discharge and from where:  12/01/2022  Lindsey Murillo   Attempts:  1st Attempt  Reason for unsuccessful TCM follow-up call:  Left voice message

## 2022-12-08 ENCOUNTER — Telehealth: Payer: Self-pay | Admitting: *Deleted

## 2022-12-08 DIAGNOSIS — N186 End stage renal disease: Secondary | ICD-10-CM | POA: Diagnosis not present

## 2022-12-08 DIAGNOSIS — N032 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: Secondary | ICD-10-CM | POA: Diagnosis not present

## 2022-12-08 DIAGNOSIS — Z992 Dependence on renal dialysis: Secondary | ICD-10-CM | POA: Diagnosis not present

## 2022-12-08 NOTE — Telephone Encounter (Signed)
Transition Care Management Unsuccessful Follow-up Telephone Call  Date of discharge and from where:  [facility] 12/01/2022  Attempts:  2nd Attempt  Reason for unsuccessful TCM follow-up call:  No answer/busy

## 2022-12-10 DIAGNOSIS — R52 Pain, unspecified: Secondary | ICD-10-CM | POA: Diagnosis not present

## 2022-12-10 DIAGNOSIS — E1040 Type 1 diabetes mellitus with diabetic neuropathy, unspecified: Secondary | ICD-10-CM | POA: Diagnosis not present

## 2022-12-10 DIAGNOSIS — N186 End stage renal disease: Secondary | ICD-10-CM | POA: Diagnosis not present

## 2022-12-10 DIAGNOSIS — D631 Anemia in chronic kidney disease: Secondary | ICD-10-CM | POA: Diagnosis not present

## 2022-12-10 DIAGNOSIS — E039 Hypothyroidism, unspecified: Secondary | ICD-10-CM | POA: Diagnosis not present

## 2022-12-10 DIAGNOSIS — L299 Pruritus, unspecified: Secondary | ICD-10-CM | POA: Diagnosis not present

## 2022-12-10 DIAGNOSIS — N2581 Secondary hyperparathyroidism of renal origin: Secondary | ICD-10-CM | POA: Diagnosis not present

## 2022-12-10 DIAGNOSIS — Z992 Dependence on renal dialysis: Secondary | ICD-10-CM | POA: Diagnosis not present

## 2022-12-13 DIAGNOSIS — Z992 Dependence on renal dialysis: Secondary | ICD-10-CM | POA: Diagnosis not present

## 2022-12-13 DIAGNOSIS — R52 Pain, unspecified: Secondary | ICD-10-CM | POA: Diagnosis not present

## 2022-12-13 DIAGNOSIS — N186 End stage renal disease: Secondary | ICD-10-CM | POA: Diagnosis not present

## 2022-12-13 DIAGNOSIS — E039 Hypothyroidism, unspecified: Secondary | ICD-10-CM | POA: Diagnosis not present

## 2022-12-13 DIAGNOSIS — E1040 Type 1 diabetes mellitus with diabetic neuropathy, unspecified: Secondary | ICD-10-CM | POA: Diagnosis not present

## 2022-12-13 DIAGNOSIS — N2581 Secondary hyperparathyroidism of renal origin: Secondary | ICD-10-CM | POA: Diagnosis not present

## 2022-12-13 DIAGNOSIS — L299 Pruritus, unspecified: Secondary | ICD-10-CM | POA: Diagnosis not present

## 2022-12-13 DIAGNOSIS — D631 Anemia in chronic kidney disease: Secondary | ICD-10-CM | POA: Diagnosis not present

## 2022-12-14 ENCOUNTER — Ambulatory Visit (INDEPENDENT_AMBULATORY_CARE_PROVIDER_SITE_OTHER): Payer: 59 | Admitting: Family Medicine

## 2022-12-14 VITALS — BP 115/76 | HR 100 | Temp 98.1°F | Ht 68.0 in | Wt 123.6 lb

## 2022-12-14 DIAGNOSIS — E1065 Type 1 diabetes mellitus with hyperglycemia: Secondary | ICD-10-CM

## 2022-12-14 DIAGNOSIS — N185 Chronic kidney disease, stage 5: Secondary | ICD-10-CM | POA: Diagnosis not present

## 2022-12-14 DIAGNOSIS — Z01810 Encounter for preprocedural cardiovascular examination: Secondary | ICD-10-CM | POA: Diagnosis not present

## 2022-12-14 NOTE — Patient Instructions (Signed)
Continue your medications.  A1C today.  Follow up in 3 months.

## 2022-12-15 DIAGNOSIS — E039 Hypothyroidism, unspecified: Secondary | ICD-10-CM | POA: Diagnosis not present

## 2022-12-15 DIAGNOSIS — Z992 Dependence on renal dialysis: Secondary | ICD-10-CM | POA: Diagnosis not present

## 2022-12-15 DIAGNOSIS — N2581 Secondary hyperparathyroidism of renal origin: Secondary | ICD-10-CM | POA: Diagnosis not present

## 2022-12-15 DIAGNOSIS — E1040 Type 1 diabetes mellitus with diabetic neuropathy, unspecified: Secondary | ICD-10-CM | POA: Diagnosis not present

## 2022-12-15 DIAGNOSIS — L299 Pruritus, unspecified: Secondary | ICD-10-CM | POA: Diagnosis not present

## 2022-12-15 DIAGNOSIS — N186 End stage renal disease: Secondary | ICD-10-CM | POA: Diagnosis not present

## 2022-12-15 DIAGNOSIS — D631 Anemia in chronic kidney disease: Secondary | ICD-10-CM | POA: Diagnosis not present

## 2022-12-15 DIAGNOSIS — R52 Pain, unspecified: Secondary | ICD-10-CM | POA: Diagnosis not present

## 2022-12-15 NOTE — Assessment & Plan Note (Signed)
Remains uncontrolled.  Difficult to control.  Suspect noncompliance is a primary driver behind this.  A1c returned at 10.5.  Recommend increasing basal insulin to 16 units.  Follow-up with endocrinology.

## 2022-12-15 NOTE — Progress Notes (Signed)
Subjective:  Patient ID: Lindsey Murillo, female    DOB: [birth year]  Age: 27 y.o. MRN: [id number]  CC:  Follow up   HPI:  27 year old female with an extensive past medical history including end-stage renal disease on hemodialysis presents for follow-up regarding recent ER visit.  Patient's symptoms have improved and she is back to her baseline.  No current nausea or vomiting.  No current abdominal pain.  Patient's blood sugar readings remain elevated.  She has upcoming follow-up with endocrinology.  Patient Active Problem List   Diagnosis Date Noted   Amenorrhea, secondary 10/26/2022   Vitamin D deficiency 08/26/2022   HTN (hypertension) 07/24/2022   Hyperlipidemia 07/21/2022   Non-adherence to medical treatment 01/21/2022   ESRD (end stage renal disease) ([name]) 12/18/2021   Secondary hyperparathyroidism of renal origin ([name]) 10/21/2021   Physical deconditioning 10/07/2021   Depression 09/25/2021   Anemia in ESRD (end-stage renal disease) ([name]) 09/25/2021   Nephrotic syndrome 05/27/2021   Chronic pancreatitis ([name]) 08/22/2019   Pancreatic pseudocyst/cyst 08/22/2019   GERD (gastroesophageal reflux disease) 08/22/2019   Uncontrolled type 1 diabetes mellitus with hyperglycemia ([name]) 05/12/2015   Essential hypertension, benign 11/30/2012   Asthma     Social Hx   Social History   Socioeconomic History   Marital status: Single    Spouse name: Not on file   Number of children: 0   Years of education: Not on file   Highest education level: High school graduate  Occupational History   Occupation: unemployed  Tobacco Use   Smoking status: Never    Passive exposure: Never   Smokeless tobacco: Never  Vaping Use   Vaping status: Never Used  Substance and Sexual Activity   Alcohol use: No   Drug use: No   Sexual activity: Not Currently    Birth control/protection: Abstinence, None  Other Topics Concern   Not on file  Social History Narrative   Not on file   Social  Determinants of Health   Financial Resource Strain: Low Risk  (10/26/2022)   Overall Financial Resource Strain (CARDIA)    Difficulty of Paying Living Expenses: Not hard at all  Food Insecurity: No Food Insecurity (10/26/2022)   Hunger Vital Sign    Worried About Running Out of Food in the Last Year: Never true    Ran Out of Food in the Last Year: Never true  Transportation Needs: No Transportation Needs (10/26/2022)   PRAPARE - Administrator, Civil Service (Medical): No    Lack of Transportation (Non-Medical): No  Physical Activity: Insufficiently Active (10/26/2022)   Exercise Vital Sign    Days of Exercise per Week: 4 days    Minutes of Exercise per Session: 10 min  Stress: No Stress Concern Present (10/26/2022)   [employer] of Occupational Health - Occupational Stress Questionnaire    Feeling of Stress : Only a little  Social Connections: Moderately Isolated (10/26/2022)   Social Connection and Isolation Panel [NHANES]    Frequency of Communication with Friends and Family: More than three times a week    Frequency of Social Gatherings with Friends and Family: More than three times a week    Attends Religious Services: More than 4 times per year    Active Member of [employer] or Organizations: No    Attends Banker Meetings: Never    Marital Status: Never married    Review of Systems Per HPI  Objective:  BP 115/76   Pulse 100  Temp 98.1 F (36.7 C)   Ht 5\' 8"  (1.727 m)   Wt 123 lb 9.6 oz (56.1 kg)   LMP 11/09/2022 (Approximate)   SpO2 97%   BMI 18.79 kg/m      12/14/2022    3:07 PM 12/01/2022    2:15 PM 12/01/2022    2:00 PM  BP/Weight  Systolic BP 115 165 177  Diastolic BP 76 96 100  Wt. (Lbs) 123.6    BMI 18.79 kg/m2      Physical Exam Vitals and nursing note reviewed.  Constitutional:      General: She is not in acute distress. HENT:     Head: Normocephalic and atraumatic.  Cardiovascular:     Rate and Rhythm: Regular rhythm.  Tachycardia present.  Pulmonary:     Effort: Pulmonary effort is normal.     Breath sounds: Normal breath sounds. No wheezing, rhonchi or rales.  Neurological:     Mental Status: She is alert.  Psychiatric:     Comments: Flat affect.     Lab Results  Component Value Date   WBC 7.6 12/01/2022   HGB 12.9 12/01/2022   HCT 39.4 12/01/2022   PLT 116 (L) 12/01/2022   GLUCOSE 170 (H) 12/01/2022   CHOL 190 07/20/2022   TRIG 500 (H) 07/20/2022   HDL 73 07/20/2022   LDLCALC 46 07/20/2022   ALT 82 (H) 12/01/2022   AST 54 (H) 12/01/2022   NA 134 (L) 12/01/2022   K 3.8 12/01/2022   CL 94 (L) 12/01/2022   CREATININE 5.12 (H) 12/01/2022   BUN 45 (H) 12/01/2022   CO2 24 12/01/2022   TSH 1.060 10/26/2022   INR 1.4 (H) 10/03/2021   HGBA1C 10.5 (H) 12/14/2022   MICROALBUR 1,195.2 (H) 06/13/2021     Assessment & Plan:   Problem List Items Addressed This Visit       Endocrine   Uncontrolled type 1 diabetes mellitus with hyperglycemia ([name]) - Primary    Remains uncontrolled.  Difficult to control.  Suspect noncompliance is a primary driver behind this.  A1c returned at 10.5.  Recommend increasing basal insulin to 16 units.  Follow-up with endocrinology.      Relevant Orders   Hemoglobin A1c (Completed)    Follow-up:  3 months   Adriana Simas DO [facility]

## 2022-12-16 ENCOUNTER — Ambulatory Visit: Payer: 59 | Admitting: Allergy

## 2022-12-17 DIAGNOSIS — E039 Hypothyroidism, unspecified: Secondary | ICD-10-CM | POA: Diagnosis not present

## 2022-12-17 DIAGNOSIS — Z992 Dependence on renal dialysis: Secondary | ICD-10-CM | POA: Diagnosis not present

## 2022-12-17 DIAGNOSIS — D631 Anemia in chronic kidney disease: Secondary | ICD-10-CM | POA: Diagnosis not present

## 2022-12-17 DIAGNOSIS — N2581 Secondary hyperparathyroidism of renal origin: Secondary | ICD-10-CM | POA: Diagnosis not present

## 2022-12-17 DIAGNOSIS — L299 Pruritus, unspecified: Secondary | ICD-10-CM | POA: Diagnosis not present

## 2022-12-17 DIAGNOSIS — N186 End stage renal disease: Secondary | ICD-10-CM | POA: Diagnosis not present

## 2022-12-17 DIAGNOSIS — R52 Pain, unspecified: Secondary | ICD-10-CM | POA: Diagnosis not present

## 2022-12-17 DIAGNOSIS — E1040 Type 1 diabetes mellitus with diabetic neuropathy, unspecified: Secondary | ICD-10-CM | POA: Diagnosis not present

## 2022-12-20 ENCOUNTER — Other Ambulatory Visit: Payer: Self-pay

## 2022-12-20 ENCOUNTER — Encounter (HOSPITAL_COMMUNITY): Payer: Self-pay | Admitting: Hematology

## 2022-12-20 ENCOUNTER — Telehealth: Payer: Self-pay | Admitting: Pharmacy Technician

## 2022-12-20 DIAGNOSIS — Z992 Dependence on renal dialysis: Secondary | ICD-10-CM | POA: Diagnosis not present

## 2022-12-20 DIAGNOSIS — L299 Pruritus, unspecified: Secondary | ICD-10-CM | POA: Diagnosis not present

## 2022-12-20 DIAGNOSIS — N186 End stage renal disease: Secondary | ICD-10-CM | POA: Diagnosis not present

## 2022-12-20 DIAGNOSIS — N2581 Secondary hyperparathyroidism of renal origin: Secondary | ICD-10-CM | POA: Diagnosis not present

## 2022-12-20 DIAGNOSIS — E039 Hypothyroidism, unspecified: Secondary | ICD-10-CM | POA: Diagnosis not present

## 2022-12-20 DIAGNOSIS — R52 Pain, unspecified: Secondary | ICD-10-CM | POA: Diagnosis not present

## 2022-12-20 DIAGNOSIS — D631 Anemia in chronic kidney disease: Secondary | ICD-10-CM | POA: Diagnosis not present

## 2022-12-20 DIAGNOSIS — E1040 Type 1 diabetes mellitus with diabetic neuropathy, unspecified: Secondary | ICD-10-CM | POA: Diagnosis not present

## 2022-12-20 NOTE — Telephone Encounter (Signed)
Pharmacy Patient Advocate Encounter  Received notification from [facility] that Prior Authorization for Dexcom  G7 sensor has been APPROVED through 05/10/2023   PA #/Case ID/Reference #: [id number]

## 2022-12-20 NOTE — Telephone Encounter (Signed)
[employer] that prior authorization for Dexcom G7 Sensor is required/requested.   Insurance verification completed.   The patient is insured through [facility] .   Per test claim: PA required; PA started via CoverMyMeds. KEY [id number] . Waiting for clinical questions to populate.

## 2022-12-20 NOTE — Telephone Encounter (Signed)
Pharmacy Patient Advocate Encounter   Completed clinical ?s and submitted PA.   Per test claim: PA required; PA submitted to [facility] via CoverMyMeds Key/confirmation #/[id number]   Status is pending

## 2022-12-20 NOTE — Telephone Encounter (Addendum)
error 

## 2022-12-21 ENCOUNTER — Ambulatory Visit: Payer: 59 | Admitting: Family Medicine

## 2022-12-22 DIAGNOSIS — N2581 Secondary hyperparathyroidism of renal origin: Secondary | ICD-10-CM | POA: Diagnosis not present

## 2022-12-22 DIAGNOSIS — Z992 Dependence on renal dialysis: Secondary | ICD-10-CM | POA: Diagnosis not present

## 2022-12-22 DIAGNOSIS — N186 End stage renal disease: Secondary | ICD-10-CM | POA: Diagnosis not present

## 2022-12-22 DIAGNOSIS — L299 Pruritus, unspecified: Secondary | ICD-10-CM | POA: Diagnosis not present

## 2022-12-22 DIAGNOSIS — R52 Pain, unspecified: Secondary | ICD-10-CM | POA: Diagnosis not present

## 2022-12-22 DIAGNOSIS — E039 Hypothyroidism, unspecified: Secondary | ICD-10-CM | POA: Diagnosis not present

## 2022-12-22 DIAGNOSIS — E1040 Type 1 diabetes mellitus with diabetic neuropathy, unspecified: Secondary | ICD-10-CM | POA: Diagnosis not present

## 2022-12-22 DIAGNOSIS — D631 Anemia in chronic kidney disease: Secondary | ICD-10-CM | POA: Diagnosis not present

## 2022-12-23 ENCOUNTER — Encounter: Payer: Self-pay | Admitting: "Endocrinology

## 2022-12-23 ENCOUNTER — Other Ambulatory Visit: Payer: Self-pay

## 2022-12-23 ENCOUNTER — Encounter: Payer: Self-pay | Admitting: Family Medicine

## 2022-12-23 ENCOUNTER — Ambulatory Visit (INDEPENDENT_AMBULATORY_CARE_PROVIDER_SITE_OTHER): Payer: 59 | Admitting: "Endocrinology

## 2022-12-23 ENCOUNTER — Ambulatory Visit (INDEPENDENT_AMBULATORY_CARE_PROVIDER_SITE_OTHER): Payer: 59 | Admitting: Family Medicine

## 2022-12-23 VITALS — BP 84/60 | HR 112 | Ht 68.0 in | Wt 119.8 lb

## 2022-12-23 VITALS — BP 90/60 | HR 105 | Temp 98.3°F | Resp 20 | Wt 119.5 lb

## 2022-12-23 DIAGNOSIS — E1065 Type 1 diabetes mellitus with hyperglycemia: Secondary | ICD-10-CM

## 2022-12-23 DIAGNOSIS — E678 Other specified hyperalimentation: Secondary | ICD-10-CM | POA: Diagnosis not present

## 2022-12-23 DIAGNOSIS — J4540 Moderate persistent asthma, uncomplicated: Secondary | ICD-10-CM

## 2022-12-23 DIAGNOSIS — E782 Mixed hyperlipidemia: Secondary | ICD-10-CM | POA: Diagnosis not present

## 2022-12-23 DIAGNOSIS — H1013 Acute atopic conjunctivitis, bilateral: Secondary | ICD-10-CM

## 2022-12-23 DIAGNOSIS — J3089 Other allergic rhinitis: Secondary | ICD-10-CM | POA: Diagnosis not present

## 2022-12-23 DIAGNOSIS — Z794 Long term (current) use of insulin: Secondary | ICD-10-CM

## 2022-12-23 MED ORDER — BASAGLAR KWIKPEN 100 UNIT/ML ~~LOC~~ SOPN: 20.0000 [IU] | PEN_INJECTOR | Freq: Every day | SUBCUTANEOUS | 1 refills | Status: DC

## 2022-12-23 MED ORDER — ALBUTEROL SULFATE HFA 108 (90 BASE) MCG/ACT IN AERS: INHALATION_SPRAY | RESPIRATORY_TRACT | 1 refills | Status: DC

## 2022-12-23 MED ORDER — FLUTICASONE PROPIONATE 50 MCG/ACT NA SUSP: 2.0000 | Freq: Every day | NASAL | 5 refills | Status: DC | PRN

## 2022-12-23 MED ORDER — CETIRIZINE HCL 1 MG/ML PO SOLN: ORAL | 2 refills | Status: DC

## 2022-12-23 NOTE — Progress Notes (Signed)
12/23/2022, 4:24 PM  Endocrinology follow-up note   Subjective:    Patient ID: Lindsey Murillo, female    DOB: 04-30-1996.  Lindsey Murillo is being seen in follow-up after she was seen in consultation for management of currently uncontrolled symptomatic diabetes requested by  [name], DO.   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 10/21/2021   Diabetic autonomic neuropathy ([name])    Diabetic peripheral neuropathy ([name])    Environmental allergies    Goiter    History of blood transfusion    Hypertension    no meds currently   Hypoglycemia associated with diabetes ([name])    Musculoskeletal pain 04/20/2022   Tachycardia    Thyroiditis, autoimmune    Type 1 diabetes mellitus in patient age 28-19 years with HbA1C goal below 7.5     Past Surgical History:  Procedure Laterality Date   AV FISTULA PLACEMENT Right 12/08/2021   Procedure: ARTERIOVENOUS  FISTULA CREATION VERSUS GRAFT;  Surgeon: Larina Earthly, MD;  Location: [facility];  Service: Vascular;  Laterality: Right;   BASCILIC VEIN TRANSPOSITION Right 01/12/2022   Procedure: RIGHT ARM SECOND STAGE BASILIC VEIN TRANSPOSITION;  Surgeon: Larina Earthly, MD;  Location: [facility];  Service: Vascular;  Laterality: Right;   BIOPSY  08/27/2016   Procedure: BIOPSY;  Surgeon: West Bali, MD;  Location: [facility];  Service: Endoscopy;;  duodenum; gastric   BIOPSY  06/27/2019   Procedure: BIOPSY;  Surgeon: Corbin Ade, MD;  Location: [facility];  Service: Endoscopy;;   BIOPSY  02/09/2022   Procedure: BIOPSY;  Surgeon: Lanelle Bal, DO;  Location: [facility];  Service: Endoscopy;;   BREAST CYST  EXCISION Right 11/12/2021   Procedure: RIGHT BREAST ABSCESS INCISION AND DRAINAGE;  Surgeon: Emelia Loron, MD;  Location: [facility] OR;  Service: General;  Laterality: Right;  LMA   COLONOSCOPY     COLONOSCOPY WITH PROPOFOL N/A 02/09/2022   Procedure: COLONOSCOPY WITH PROPOFOL;  Surgeon: Lanelle Bal, DO;  Location: [facility];  Service: Endoscopy;  Laterality: N/A;  7:30am, asa 3, dialysis pt   ESOPHAGOGASTRODUODENOSCOPY N/A 08/27/2016   Dr. Darrick Penna: mild gastritis. Negative celiac. No obvious source for dyspepsia/diarrhea   ESOPHAGOGASTRODUODENOSCOPY (EGD) WITH PROPOFOL N/A 06/27/2019   rourk: Focal abnormality of the gastric mucosa likely due to trauma (heaving).  Biopsy showed mild gastritis, negative for H. pylori.  Esophageal dilation for history of dysphagia but normal-appearing esophagus.   ESOPHAGOGASTRODUODENOSCOPY (EGD) WITH PROPOFOL N/A 02/09/2022   Procedure: ESOPHAGOGASTRODUODENOSCOPY (EGD) WITH PROPOFOL;  Surgeon: Lanelle Bal, DO;  Location: [facility];  Service: Endoscopy;  Laterality: N/A;   IR FLUORO GUIDE CV LINE RIGHT  10/13/2021   IR FLUORO GUIDE CV LINE RIGHT  10/19/2021   IR US GUIDE VASC ACCESS RIGHT  10/13/2021   IRRIGATION AND DEBRIDEMENT ABSCESS  N/A 10/04/2021   Procedure: IRRIGATION AND DEBRIDEMENT NECK ABSCESS;  Surgeon: Axel Filler, MD;  Location: [facility] OR;  Service: General;  Laterality: N/A;    Social History   Socioeconomic History   Marital status: Single    Spouse name: Not on file   Number of children: 0   Years of education: Not on file   Highest education level: High school graduate  Occupational History   Occupation: unemployed  Tobacco Use   Smoking status: Never    Passive exposure: Never   Smokeless tobacco: Never  Vaping Use   Vaping status: Never Used  Substance and Sexual Activity   Alcohol use: No   Drug use: No   Sexual activity: Not Currently    Birth control/protection: Abstinence, None  Other Topics Concern   Not on file   Social History Narrative   Not on file   Social Determinants of Health   Financial Resource Strain: Low Risk  (10/26/2022)   Overall Financial Resource Strain (CARDIA)    Difficulty of Paying Living Expenses: Not hard at all  Food Insecurity: No Food Insecurity (10/26/2022)   Hunger Vital Sign    Worried About Running Out of Food in the Last Year: Never true    Ran Out of Food in the Last Year: Never true  Transportation Needs: No Transportation Needs (10/26/2022)   PRAPARE - Administrator, Civil Service (Medical): No    Lack of Transportation (Non-Medical): No  Physical Activity: Insufficiently Active (10/26/2022)   Exercise Vital Sign    Days of Exercise per Week: 4 days    Minutes of Exercise per Session: 10 min  Stress: No Stress Concern Present (10/26/2022)   [employer] of Occupational Health - Occupational Stress Questionnaire    Feeling of Stress : Only a little  Social Connections: Moderately Isolated (10/26/2022)   Social Connection and Isolation Panel [NHANES]    Frequency of Communication with Friends and Family: More than three times a week    Frequency of Social Gatherings with Friends and Family: More than three times a week    Attends Religious Services: More than 4 times per year    Active Member of [employer] or Organizations: No    Attends Engineer, structural: Never    Marital Status: Never married    Family History  Problem Relation Age of Onset   Alcohol abuse Paternal Grandfather    Huntington's disease Maternal Grandfather    Cancer Father        pancreatic   Diabetes Mother        Type II DM   Colon cancer Neg Hx    Colon polyps Neg Hx     Outpatient Encounter Medications as of 12/23/2022  Medication Sig   acetaminophen (TYLENOL) 325 MG tablet Take by mouth.   albuterol (VENTOLIN HFA) 108 (90 Base) MCG/ACT inhaler Inhale into the lungs.   Blood Glucose Monitoring Suppl (ACCU-CHEK GUIDE ME) w/Device KIT 1 Piece by Does not  apply route as directed.   Continuous Glucose Receiver (DEXCOM G7 RECEIVER) DEVI by Does not apply route.   Continuous Glucose Sensor (DEXCOM G7 SENSOR) MISC by Does not apply route.   Fluticasone Furoate (ARNUITY ELLIPTA) 100 MCG/ACT AEPB Inhale 1 Dose into the lungs daily.   glucose blood (ACCU-CHEK GUIDE) test strip Use to monitor glucose 4 times a day as instructed   insulin aspart (FIASP FLEXTOUCH) 100 UNIT/ML FlexTouch Pen Inject 4-7 Units into the skin 3 (three) times  daily before meals.   Insulin Glargine (BASAGLAR KWIKPEN) 100 UNIT/ML Inject 20 Units into the skin at bedtime.   Insulin Pen Needle 31G X 4 MM MISC Use as directed to administer insulin.   medroxyPROGESTERone (PROVERA) 10 MG tablet Take provera 10 mg for 10 days every 3 months   Methoxy PEG-Epoetin Beta (MIRCERA IJ) Mircera   ondansetron (ZOFRAN-ODT) 4 MG disintegrating tablet 4mg  ODT q4 hours prn nausea/vomit   oxyCODONE (OXY IR/ROXICODONE) 5 MG immediate release tablet    pantoprazole (PROTONIX) 40 MG tablet Take 1 tablet (40 mg total) by mouth 2 (two) times daily before a meal.   rosuvastatin (CRESTOR) 10 MG tablet Take 1 tablet (10 mg total) by mouth daily.   sucroferric oxyhydroxide (VELPHORO) 500 MG chewable tablet Chew 500 mg by mouth 3 (three) times daily with meals.   Vitamin D, Ergocalciferol, (DRISDOL) 1.25 MG (50000 UNIT) CAPS capsule Take 50,000 Units by mouth every 7 (seven) days.   [DISCONTINUED] Insulin Glargine (BASAGLAR KWIKPEN) 100 UNIT/ML Inject 14 Units into the skin at bedtime. (Patient taking differently: Inject 16 Units into the skin at bedtime.)   No facility-administered encounter medications on file as of 12/23/2022.    ALLERGIES: No Known Allergies  VACCINATION STATUS: Immunization History  Administered Date(s) Administered   Hepb-cpg 10/30/2021, 11/25/2021, 12/23/2021, 02/24/2022   Influenza,inj,Quad PF,6+ Mos 03/13/2013, 02/25/2014, 04/09/2016, 01/26/2017, 01/18/2019, 06/15/2021,  02/08/2022   PNEUMOCOCCAL CONJUGATE-20 10/28/2021   Td 04/15/2017   Td (Adult),5 Lf Tetanus Toxid, Preservative Free 04/15/2017    Diabetes She has type 1 diabetes mellitus. Her disease course has been worsening. There are no hypoglycemic associated symptoms. Pertinent negatives for diabetes include no polydipsia and no polyuria. There are no hypoglycemic complications. Symptoms are worsening. Diabetic complications include nephropathy and peripheral neuropathy. (Non adherence to medical treatment.) Risk factors for coronary artery disease include diabetes mellitus, dyslipidemia and sedentary lifestyle. Current diabetic treatment includes intensive insulin program. Her weight is fluctuating minimally. She is following a generally unhealthy diet. When asked about meal planning, she reported none. She never participates in exercise. Her home blood glucose trend is increasing steadily. Her breakfast blood glucose range is generally >200 mg/dl. Her lunch blood glucose range is generally >200 mg/dl. Her dinner blood glucose range is generally >200 mg/dl. Her bedtime blood glucose range is generally >200 mg/dl. Her overall blood glucose range is >200 mg/dl. (She has dissing once again from self-care.  Her Dexcom analysis shows 13% time range, 18% level 1 hyperglycemia, 69% level 2 hyperglycemia.  She has no hypoglycemia.  Her average blood glucose is 309 mg per DL.  Her recent labs show A1c of 10.5%.   ) An ACE inhibitor/angiotensin II receptor blocker is being taken.  Hyperlipidemia This is a chronic problem. Exacerbating diseases include diabetes. Current antihyperlipidemic treatment includes statins. Risk factors for coronary artery disease include dyslipidemia, diabetes mellitus, hypertension and a sedentary lifestyle.  Hypertension This is a chronic problem. Risk factors for coronary artery disease include dyslipidemia and diabetes mellitus. Past treatments include ACE inhibitors.     Review of  Systems  Endocrine: Negative for polydipsia and polyuria.    Objective:       12/23/2022    3:42 PM 12/23/2022    2:11 PM 12/14/2022    3:07 PM  Vitals with BMI  Height 5\' 8"   5\' 8"   Weight 119 lbs 13 oz 119 lbs 8 oz 123 lbs 10 oz  BMI 18.22 18.17 18.8  Systolic 84 90 115  Diastolic 60  60 76  Pulse 112 105 100    BP (!) 84/60   Pulse (!) 112   Ht 5\' 8"  (1.727 m)   Wt 119 lb 12.8 oz (54.3 kg)   BMI 18.22 kg/m   Wt Readings from Last 3 Encounters:  12/23/22 119 lb 12.8 oz (54.3 kg)  12/23/22 119 lb 8 oz (54.2 kg)  12/14/22 123 lb 9.6 oz (56.1 kg)     Physical Exam    CMP ( most recent) CMP     Component Value Date/Time   NA 134 (L) 12/01/2022 1018   NA 132 (L) 09/24/2021 1453   K 3.8 12/01/2022 1018   CL 94 (L) 12/01/2022 1018   CO2 24 12/01/2022 1018   GLUCOSE 170 (H) 12/01/2022 1018   BUN 45 (H) 12/01/2022 1018   BUN 36 (H) 09/24/2021 1453   CREATININE 5.12 (H) 12/01/2022 1018   CREATININE 0.98 08/29/2019 1637   CALCIUM 8.6 (L) 12/01/2022 1018   PROT 7.9 12/01/2022 1018   PROT 5.5 (L) 09/24/2021 1453   ALBUMIN 3.8 12/01/2022 1018   ALBUMIN 2.1 (L) 09/24/2021 1453   AST 54 (H) 12/01/2022 1018   ALT 82 (H) 12/01/2022 1018   ALKPHOS 315 (H) 12/01/2022 1018   BILITOT 1.4 (H) 12/01/2022 1018   BILITOT <0.2 09/24/2021 1453   GFRNONAA 11 (L) 12/01/2022 1018   GFRAA >60 10/27/2019 2021     Diabetic Labs (most recent): Lab Results  Component Value Date   HGBA1C 10.5 (H) 12/14/2022   HGBA1C 10.5 (H) 07/20/2022   HGBA1C 8.3 (A) 01/21/2022   MICROALBUR 1,195.2 (H) 06/13/2021   MICROALBUR 0.50 11/28/2012   MICROALBUR 0.50 07/20/2012     Lipid Panel ( most recent) Lipid Panel     Component Value Date/Time   CHOL 190 07/20/2022 1440   TRIG 500 (H) 07/20/2022 1440   HDL 73 07/20/2022 1440   CHOLHDL 2.6 07/20/2022 1440   CHOLHDL 2.0 11/05/2015 1252   VLDL 12 11/05/2015 1252   LDLCALC 46 07/20/2022 1440   LABVLDL 71 (H) 07/20/2022 1440      Lab  Results  Component Value Date   TSH 1.060 10/26/2022   TSH 0.975 10/03/2021   TSH 2.520 05/26/2021   TSH 1.802 05/14/2021   TSH 0.342 (L) 05/27/2018   TSH 0.55 11/05/2015   TSH 1.175 11/28/2012   TSH 0.706 07/20/2012   TSH 1.008 08/26/2011   FREET4 1.19 10/26/2022   FREET4 1.4 11/05/2015   FREET4 1.26 11/28/2012   FREET4 1.44 07/20/2012   FREET4 1.23 08/26/2011      Assessment & Plan:   1. 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  -She has dissing once again from self-care.  Her Dexcom analysis shows 13% time range, 18% level 1 hyperglycemia, 69% level 2 hyperglycemia.  She has no hypoglycemia.  Her average blood glucose is 309 mg per DL.  Her recent labs show A1c of 10.5%.    She presents with her meter showing average blood glucose of 159-195 for the last 30 days.  In the interim, she has lower her Semglee to 12 units nightly, continue to hold her prandial insulin.    - I had a long discussion with her about the progressive nature of diabetes and the pathology behind its complications. -her diabetes is complicated by peripheral neuropathy, ESRD, noncompliance/nonadherence, peripheral ? Lymphedema she remains at a high  risk for more acute and chronic complications which include CAD, CVA, CKD, retinopathy, and neuropathy. These are all discussed in detail with her.  - I discussed all available options of managing her diabetes including de-escalation of medications. I have counseled her on diet  and weight management  by adopting a Whole Food , Plant Predominant  ( WFPP) nutrition as recommended by [employer] of Lifestyle Medicine. Patient is encouraged to switch to  unprocessed or minimally processed  complex starch, adequate protein intake (mainly plant source), minimal liquid fat ( mainly vegetable oils), plenty of fruits, and vegetables. -  she is advised to stick to a  routine mealtimes to eat 3 complete meals a day and snack only when necessary ( to snack only to correct hypoglycemia BG <70 day time or <100 at night).   - she acknowledges that there is a room for improvement in her food and drink choices. - Further Specific Suggestion is made for her to avoid simple carbohydrates  from her diet including Cakes, Sweet Desserts, Ice Cream, Soda (diet and regular), Sweet Tea, Candies, Chips, Cookies, Store Bought Juices, Alcohol ,  Artificial Sweeteners,  Coffee Creamer, and "Sugar-free" Products. This will help patient to have more stable blood glucose profile and potentially avoid unintended weight gain.  - I have approached her with the following plan to manage  her diabetes and patient agrees:   -Patient has a habit of easy disengagement from self-care.  I had a long discussion about reengaging in order for her to achieve control of diabetes to target.    -Accordingly, she is advised to continue to use her Dexcom CGM adequately.  She is advised to increase Basaglar to 20 units nightly, advised to resume and continue NovoLog 4 -7  units 3 times daily AC for Premeal blood glucose readings above 90 mg/day.  She is given patient specific correction dose from [place].     She is encouraged to call clinic for blood glucose readings less than 70 or greater than 200 mg per DL x3.    - she is warned not to take insulin without proper monitoring per orders. - She does not have a suitable , non insulin option to treat her diabetes.  - Specific targets for  A1c;  LDL, HDL,  and Triglycerides were discussed with the patient.  2) Blood Pressure /Hypertension:  -Her blood pressure is controlled marginally, 84/60.  She is not on lisinopril anymore.  She is asymptomatic.  Blood pressure management will be deferred to nephrology.  She will be considered for a.m. cortisol at subsequent visits.    3) Lipids/Hyperlipidemia:   Review of her recent lipid panel showed improving LDL  to 46.  This is dropping from 109.    she  is advised to continue Lipitor 10 mg p.o. daily at bedtime.      Side effects and precautions discussed with her.  4)  Weight/Diet:  Body mass index is 18.22 kg/m.   She is not a candidate for weight loss.    5) Chronic Care/Health Maintenance:  -she  is on ACEI/ARB and Statin medications and  is encouraged to initiate and continue to follow up with Ophthalmology, Dentist,  Podiatrist at least yearly or according to recommendations, and advised to  stay away from smoking. I have recommended yearly flu vaccine and pneumonia vaccine at least every 5 years; moderate intensity exercise for up to 150 minutes weekly; and  sleep for 7- 9 hours a day.  - she  is  advised to maintain close follow up with [name], DO for primary care needs, as well as her other providers for optimal and coordinated care.  I spent  26  minutes in the care of the patient today including review of labs from CMP, Lipids, Thyroid Function, Hematology (current and previous including abstractions from other facilities); face-to-face time discussing  her blood glucose readings/logs, discussing hypoglycemia and hyperglycemia episodes and symptoms, medications doses, her options of short and long term treatment based on the latest standards of care / guidelines;  discussion about incorporating lifestyle medicine;  and documenting the encounter. Risk reduction counseling performed per USPSTF guidelines to reduce  cardiovascular risk factors.     Please refer to Patient Instructions for Blood Glucose Monitoring and Insulin/Medications Dosing Guide"  in media tab for additional information. Please  also refer to " Patient Self Inventory" in the Media  tab for reviewed elements of pertinent patient history.  Mirant participated in the discussions, expressed understanding, and voiced agreement with the above plans.  All questions were answered to her satisfaction. she is  encouraged to contact clinic should she have any questions or concerns prior to her return visit.  Follow up plan: - Return in about 4 weeks (around 01/20/2023) for F/U with Meter/CGM Megan Salon Only - no Labs.  Marquis Lunch, MD [facility] [address] [place], [place] [address] Phone: [phone]  Fax: [phone]    12/23/2022, 4:24 PM  This note was partially dictated with voice recognition software. Similar sounding words can be transcribed inadequately or may not  be corrected upon review.

## 2022-12-23 NOTE — Patient Instructions (Signed)
Asthma Continue Arnuity 100 (orange top)-1 puff once a day to prevent cough or wheeze Begin albuterol 2 puffs once every 4 hours as needed for cough or wheeze. Look on the inhaler or on the canister to see the name albuterol You may use albuterol 2 puffs 5 to 15 minutes before activity to decrease cough or wheeze  Allergic rhinitis Continue allergen avoidance measures directed toward dust mite and cockroach as listed below Let's change cetirizine to 1.25 mg twice a day as needed for runny nose or itch. This will change to a liquid form Begin Flonase 2 sprays in each nostril once a day as needed for a stuffy nose.  In the right nostril, point the applicator out toward the right ear. In the left nostril, point the applicator out toward the left ear Consider saline nasal rinses as needed for nasal symptoms. Use this before any medicated nasal sprays for best result  Allergic conjunctivitis Some over the counter eye drops include Pataday one drop in each eye once a day as needed for red, itchy eyes OR Zaditor one drop in each eye twice a day as needed for red itchy eyes. Avoid eye drops that say red eye relief as they may contain medications that dry out your eyes.   Call the clinic if this treatment plan is not working well for you.  Follow up in 3 months or sooner if needed.   Control of Dust Mite Allergen Dust mites play a major role in allergic asthma and rhinitis. They occur in environments with high humidity wherever human skin is found. Dust mites absorb humidity from the atmosphere (ie, they do not drink) and feed on organic matter (including shed human and animal skin). Dust mites are a microscopic type of insect that you cannot see with the naked eye. High levels of dust mites have been detected from mattresses, pillows, carpets, upholstered furniture, bed covers, clothes, soft toys and any woven material. The principal allergen of the dust mite is found in its feces. A gram of dust may  contain 1,000 mites and 250,000 fecal particles. Mite antigen is easily measured in the air during house cleaning activities. Dust mites do not bite and do not cause harm to humans, other than by triggering allergies/asthma.  Ways to decrease your exposure to dust mites in your home:  1. Encase mattresses, box springs and pillows with a mite-impermeable barrier or cover  2. Wash sheets, blankets and drapes weekly in hot water (130 F) with detergent and dry them in a dryer on the hot setting.  3. Have the room cleaned frequently with a vacuum cleaner and a damp dust-mop. For carpeting or rugs, vacuuming with a vacuum cleaner equipped with a high-efficiency particulate air (HEPA) filter. The dust mite allergic individual should not be in a room which is being cleaned and should wait 1 hour after cleaning before going into the room.  4. Do not sleep on upholstered furniture (eg, couches).  5. If possible removing carpeting, upholstered furniture and drapery from the home is ideal. Horizontal blinds should be eliminated in the rooms where the person spends the most time (bedroom, study, television room). Washable vinyl, roller-type shades are optimal.  6. Remove all non-washable stuffed toys from the bedroom. Wash stuffed toys weekly like sheets and blankets above.  7. Reduce indoor humidity to less than 50%. Inexpensive humidity monitors can be purchased at most hardware stores. Do not use a humidifier as can make the problem worse and are not  recommended.  Control of Cockroach Allergen Cockroach allergen has been identified as an important cause of acute attacks of asthma, especially in urban settings.  There are fifty-five species of cockroach that exist in the [place], however only three, the [place], [place] species produce allergen that can affect patients with Asthma.  Allergens can be obtained from fecal particles, egg casings and secretions from cockroaches.    Remove  food sources. Reduce access to water. Seal access and entry points. Spray runways with 0.5-1% Diazinon or Chlorpyrifos Blow boric acid power under stoves and refrigerator. Place bait stations (hydramethylnon) at feeding sites.

## 2022-12-23 NOTE — Progress Notes (Signed)
[address] [place] [address] Dept: [phone]  FOLLOW UP NOTE  Patient ID: Lindsey Murillo, female    DOB: Mar 20, 1996  Age: 27 y.o. MRN: [id number] Date of Office Visit: 12/23/2022  Assessment  Chief Complaint: Follow-up (Inhaler that was last prescribed does not work.)  HPI Lindsey Murillo is a 27 year old female who presents to the clinic for follow-up visit.  She was last seen in this clinic on 08/05/2022 by Dr. Delorse Lek as a new patient for evaluation of asthma, allergic rhinitis, allergic conjunctivitis, and possible food allergy.   At today's visit, she reports her asthma has been moderately well-controlled with shortness of breath with activity and rest as the main symptom.  She denies wheezing and coughing with activity and rest.  She continues albuterol several times a day with no relief of symptoms and rarely uses Arnuity 100.  We had a detailed discussion regarding the mechanism of action of albuterol and Arnuity.  Patient with new understanding to use Arnuity 100 daily.   Allergic rhinitis is reported as moderately well-controlled with nasal congestion as the main symptom.  She denies rhinorrhea, sneezing, and postnasal drainage.  She continues cetirizine 2.5 mg daily, however, she reports this does make her feel very sleepy.  She is interested in changing this medication to a liquid and taking 1.25 mg twice a day to extend the coverage and in hopes that she will not be as sleepy.  Her last environmental allergy skin testing was on 08/05/2022 and was positive to dust mite and cockroach.    Allergic conjunctivitis is reported as moderately well-controlled with occasional red and itchy eyes for which she uses olopatadine with relief of symptoms.  She is not currently avoiding any foods at this time.  Her last food allergy skin testing was on 08/05/2022 and was negative to the most allergenic foods.  Her current medications are listed in the chart.  Drug Allergies:   No Known Allergies  Physical Exam: BP 90/60   Pulse (!) 105   Temp 98.3 F (36.8 C) (Temporal)   Resp 20   Wt 119 lb 8 oz (54.2 kg)   SpO2 99%   BMI 18.17 kg/m    Physical Exam Vitals reviewed.  Constitutional:      Appearance: Normal appearance.  HENT:     Head: Normocephalic and atraumatic.     Right Ear: Tympanic membrane normal.     Left Ear: Tympanic membrane normal.     Nose:     Comments: Bilateral naris slightly erythematous with thin clear nasal drainage noted.  Pharynx normal.  Ears normal.  Eyes normal.    Mouth/Throat:     Pharynx: Oropharynx is clear.  Eyes:     Conjunctiva/sclera: Conjunctivae normal.  Cardiovascular:     Rate and Rhythm: Normal rate.     Heart sounds: Normal heart sounds. No murmur heard. Pulmonary:     Effort: Pulmonary effort is normal.     Breath sounds: Normal breath sounds.     Comments: Lungs clear to auscultation Musculoskeletal:        General: Normal range of motion.     Cervical back: Normal range of motion and neck supple.  Skin:    Comments: Scattered hypopigmented areas noted covering her body.  Neurological:     Mental Status: She is alert and oriented to person, place, and time.  Psychiatric:        Mood and Affect: Mood normal.  Behavior: Behavior normal.        Thought Content: Thought content normal.        Judgment: Judgment normal.     Diagnostics: FVC 2.15 which is 55% of predicted value, FEV1 1.99 which is 60% of predicted value.  Spirometry indicates possible restriction.  This is consistent with previous spirometry readings.  Assessment and Plan: 1. Moderate persistent reactive airway disease without complication   2. Non-seasonal allergic rhinitis due to other allergic trigger   3. Allergic conjunctivitis of both eyes     Meds ordered this encounter  Medications   albuterol (VENTOLIN HFA) 108 (90 Base) MCG/ACT inhaler    Sig: Inhale into the lungs.    Dispense:  18 g    Refill:  1    cetirizine HCl (ZYRTEC) 1 MG/ML solution    Sig: Take 1.25 ml twice a day as needed for a runny nose or itch    Dispense:  118 mL    Refill:  2   fluticasone (FLONASE) 50 MCG/ACT nasal spray    Sig: Place 2 sprays into both nostrils daily as needed for allergies or rhinitis.    Dispense:  16 g    Refill:  5    Patient Instructions  Asthma Continue Arnuity 100 (orange top)-1 puff once a day to prevent cough or wheeze Begin albuterol 2 puffs once every 4 hours as needed for cough or wheeze. Look on the inhaler or on the canister to see the name albuterol You may use albuterol 2 puffs 5 to 15 minutes before activity to decrease cough or wheeze  Allergic rhinitis Continue allergen avoidance measures directed toward dust mite and cockroach as listed below Let's change cetirizine to 1.25 mg twice a day as needed for runny nose or itch. This will change to a liquid form Begin Flonase 2 sprays in each nostril once a day as needed for a stuffy nose.  In the right nostril, point the applicator out toward the right ear. In the left nostril, point the applicator out toward the left ear Consider saline nasal rinses as needed for nasal symptoms. Use this before any medicated nasal sprays for best result  Allergic conjunctivitis Some over the counter eye drops include Pataday one drop in each eye once a day as needed for red, itchy eyes OR Zaditor one drop in each eye twice a day as needed for red itchy eyes. Avoid eye drops that say red eye relief as they may contain medications that dry out your eyes.   Call the clinic if this treatment plan is not working well for you.  Follow up in 3 months or sooner if needed.   Return in about 3 months (around 03/25/2023), or if symptoms worsen or fail to improve.    Thank you for the opportunity to care for this patient.  Please do not hesitate to contact me with questions.  Thermon Leyland, FNP [facility]

## 2022-12-24 DIAGNOSIS — R52 Pain, unspecified: Secondary | ICD-10-CM | POA: Diagnosis not present

## 2022-12-24 DIAGNOSIS — E039 Hypothyroidism, unspecified: Secondary | ICD-10-CM | POA: Diagnosis not present

## 2022-12-24 DIAGNOSIS — Z7951 Long term (current) use of inhaled steroids: Secondary | ICD-10-CM | POA: Diagnosis not present

## 2022-12-24 DIAGNOSIS — Z87891 Personal history of nicotine dependence: Secondary | ICD-10-CM | POA: Diagnosis not present

## 2022-12-24 DIAGNOSIS — Z79899 Other long term (current) drug therapy: Secondary | ICD-10-CM | POA: Diagnosis not present

## 2022-12-24 DIAGNOSIS — N2581 Secondary hyperparathyroidism of renal origin: Secondary | ICD-10-CM | POA: Diagnosis not present

## 2022-12-24 DIAGNOSIS — R42 Dizziness and giddiness: Secondary | ICD-10-CM | POA: Diagnosis not present

## 2022-12-24 DIAGNOSIS — D631 Anemia in chronic kidney disease: Secondary | ICD-10-CM | POA: Diagnosis not present

## 2022-12-24 DIAGNOSIS — E1040 Type 1 diabetes mellitus with diabetic neuropathy, unspecified: Secondary | ICD-10-CM | POA: Diagnosis not present

## 2022-12-24 DIAGNOSIS — Z794 Long term (current) use of insulin: Secondary | ICD-10-CM | POA: Diagnosis not present

## 2022-12-24 DIAGNOSIS — N186 End stage renal disease: Secondary | ICD-10-CM | POA: Diagnosis not present

## 2022-12-24 DIAGNOSIS — R519 Headache, unspecified: Secondary | ICD-10-CM | POA: Diagnosis not present

## 2022-12-24 DIAGNOSIS — L299 Pruritus, unspecified: Secondary | ICD-10-CM | POA: Diagnosis not present

## 2022-12-24 DIAGNOSIS — E119 Type 2 diabetes mellitus without complications: Secondary | ICD-10-CM | POA: Diagnosis not present

## 2022-12-24 DIAGNOSIS — Z992 Dependence on renal dialysis: Secondary | ICD-10-CM | POA: Diagnosis not present

## 2022-12-25 ENCOUNTER — Encounter: Payer: Self-pay | Admitting: Family Medicine

## 2022-12-25 MED ORDER — ARNUITY ELLIPTA 100 MCG/ACT IN AEPB: 1.0000 | INHALATION_SPRAY | Freq: Every day | RESPIRATORY_TRACT | 5 refills | Status: DC

## 2022-12-27 ENCOUNTER — Ambulatory Visit (INDEPENDENT_AMBULATORY_CARE_PROVIDER_SITE_OTHER): Payer: 59 | Admitting: Podiatry

## 2022-12-27 DIAGNOSIS — L97511 Non-pressure chronic ulcer of other part of right foot limited to breakdown of skin: Secondary | ICD-10-CM | POA: Diagnosis not present

## 2022-12-27 DIAGNOSIS — E10621 Type 1 diabetes mellitus with foot ulcer: Secondary | ICD-10-CM | POA: Diagnosis not present

## 2022-12-27 DIAGNOSIS — Z992 Dependence on renal dialysis: Secondary | ICD-10-CM | POA: Diagnosis not present

## 2022-12-27 DIAGNOSIS — E039 Hypothyroidism, unspecified: Secondary | ICD-10-CM | POA: Diagnosis not present

## 2022-12-27 DIAGNOSIS — L299 Pruritus, unspecified: Secondary | ICD-10-CM | POA: Diagnosis not present

## 2022-12-27 DIAGNOSIS — N2581 Secondary hyperparathyroidism of renal origin: Secondary | ICD-10-CM | POA: Diagnosis not present

## 2022-12-27 DIAGNOSIS — N186 End stage renal disease: Secondary | ICD-10-CM | POA: Diagnosis not present

## 2022-12-27 DIAGNOSIS — D631 Anemia in chronic kidney disease: Secondary | ICD-10-CM | POA: Diagnosis not present

## 2022-12-27 DIAGNOSIS — E1040 Type 1 diabetes mellitus with diabetic neuropathy, unspecified: Secondary | ICD-10-CM | POA: Diagnosis not present

## 2022-12-27 DIAGNOSIS — R52 Pain, unspecified: Secondary | ICD-10-CM | POA: Diagnosis not present

## 2022-12-27 NOTE — Progress Notes (Unsigned)
Chief Complaint  Patient presents with   Foot Ulcer     Follow up ulcer submet 1 right foot.  Patient states she is doing well. Denies drainage, redness, swelling or pain.  Denies N/V/F/C/NS.  Presents today wearing socks with slide type shoes.    HPI: 27 y.o. female presenting today for follow-up of right submet 1 ulceration.  She was supposed to bring her sneakers in today for [place] to try and offload the area for her.  She did forget to bring them in, and does not have any shoes that we can work with to offload the area today.  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 10/21/2021   Diabetic autonomic neuropathy ([name])    Diabetic peripheral neuropathy ([name])    Environmental allergies    Goiter    History of blood transfusion    Hypertension    no meds currently   Hypoglycemia associated with diabetes ([name])    Musculoskeletal pain 04/20/2022   Tachycardia    Thyroiditis, autoimmune    Type 1 diabetes mellitus in patient age 55-19 years with HbA1C goal below 7.5     Past Surgical History:  Procedure Laterality Date   AV FISTULA PLACEMENT Right 12/08/2021   Procedure: ARTERIOVENOUS  FISTULA CREATION VERSUS GRAFT;  Surgeon: Larina Earthly, MD;  Location: [facility];  Service: Vascular;  Laterality: Right;   BASCILIC VEIN TRANSPOSITION Right 01/12/2022   Procedure: RIGHT ARM SECOND STAGE BASILIC VEIN TRANSPOSITION;  Surgeon: Larina Earthly, MD;  Location: [facility];  Service: Vascular;  Laterality: Right;   BIOPSY  08/27/2016   Procedure: BIOPSY;  Surgeon: West Bali, MD;  Location: [facility];  Service: Endoscopy;;  duodenum; gastric   BIOPSY  06/27/2019   Procedure: BIOPSY;  Surgeon: Corbin Ade, MD;  [facility]: [facility];  Service:  Endoscopy;;   BIOPSY  02/09/2022   Procedure: BIOPSY;  Surgeon: Lanelle Bal, DO;  [facility];  Service: Endoscopy;;   BREAST CYST EXCISION Right 11/12/2021   Procedure: RIGHT BREAST ABSCESS INCISION AND DRAINAGE;  Surgeon: Emelia Loron, MD;  Location: [facility] OR;  Service: General;  Laterality: Right;  LMA   COLONOSCOPY     COLONOSCOPY WITH PROPOFOL N/A 02/09/2022   Procedure: COLONOSCOPY WITH PROPOFOL;  Surgeon: Lanelle Bal, DO;  [facility];  Service: Endoscopy;  Laterality: N/A;  7:30am, asa 3, dialysis pt   ESOPHAGOGASTRODUODENOSCOPY N/A 08/27/2016   Dr. Darrick Penna: mild gastritis. Negative celiac. No obvious source for dyspepsia/diarrhea   ESOPHAGOGASTRODUODENOSCOPY (EGD) WITH PROPOFOL N/A 06/27/2019   rourk: Focal abnormality of the gastric mucosa likely due to trauma (heaving).  Biopsy showed mild gastritis, negative for H. pylori.  Esophageal dilation for history of dysphagia but normal-appearing esophagus.   ESOPHAGOGASTRODUODENOSCOPY (EGD) WITH PROPOFOL N/A 02/09/2022   Procedure: ESOPHAGOGASTRODUODENOSCOPY (EGD) WITH PROPOFOL;  Surgeon: Lanelle Bal, DO;  [facility];  Service: Endoscopy;  Laterality: N/A;   IR FLUORO GUIDE CV LINE RIGHT  10/13/2021   IR FLUORO GUIDE CV LINE RIGHT  10/19/2021   IR US GUIDE VASC ACCESS RIGHT  10/13/2021   IRRIGATION AND DEBRIDEMENT ABSCESS N/A 10/04/2021   Procedure: IRRIGATION AND DEBRIDEMENT NECK ABSCESS;  Surgeon: Axel Filler, MD;  Location: [facility] OR;  Service: General;  Laterality: N/A;    No Known Allergies  Smoker?: non-smoker   PHYSICAL EXAM: There were no vitals filed for this visit.  General: The patient is alert and oriented x3 in no acute distress.  Dermatology: Skin is warm, dry and supple bilateral lower extremities. Interspaces are clear of maceration and debris.      Wound 1 description:  Location: Right submet 1    Depth: to sub-epidermis    Wound Border: Hemorrhagic callus    Wound  Base: Hemorrhagic callus Odor?:  None    Surrounding Tissue: No erythema    Infected?:  No    Necrosis?:  None noted    Pain?:  Yes, mild    Tunneling: No    Dimensions (cm): Not applicable  Vascular: Pedal pulses are diminished  Neurological: Light touch sensation diminished to plantar forefoot  Musculoskeletal Exam: Plantarflexed first metatarsal right foot     Latest Ref Rng & Units 12/14/2022    3:46 PM 07/20/2022    2:40 PM 01/21/2022   11:15 AM  Hemoglobin A1C  Hemoglobin-A1c 4.8 - 5.6 % 10.5  10.5  8.3    ASSESSMENT / PLAN OF CARE: 1. Type 1 diabetes mellitus with foot ulcer (CODE) ([name])   2. Ulcer of right foot, limited to breakdown of skin ([name])    The grade 1 ulceration was sharply debrided of hyperkeratotic and devitalized soft tissue with sterile #312 blade to the level of epidermis.   Patient was strongly encouraged to bring her shoes with removable insoles with her to her next appointment since she had forgotten them today.  We must offload the submet 1 area to try to prevent recurrence and any breakdown of the skin.  The skin is very fragile at this time and is prone to ulceration if not properly offloaded soon  Discussed risks / concerns regarding ulcer with patient and possible sequelae if left untreated.  Stressed importance of infection prevention at home. Short-term goals are: prevent infection, off-load ulcer, resolve ulcer Long-term goals are:  prevent recurrence, prevent amputation.   Return in about 4 weeks (around 01/24/2023) for f/u ulcer.   Clerance Lav, DPM, FACFAS [facility]     [address] [place], [place] [address]                Office [phone]  Fax [phone]

## 2022-12-29 DIAGNOSIS — E1040 Type 1 diabetes mellitus with diabetic neuropathy, unspecified: Secondary | ICD-10-CM | POA: Diagnosis not present

## 2022-12-29 DIAGNOSIS — N186 End stage renal disease: Secondary | ICD-10-CM | POA: Diagnosis not present

## 2022-12-29 DIAGNOSIS — L299 Pruritus, unspecified: Secondary | ICD-10-CM | POA: Diagnosis not present

## 2022-12-29 DIAGNOSIS — N2581 Secondary hyperparathyroidism of renal origin: Secondary | ICD-10-CM | POA: Diagnosis not present

## 2022-12-29 DIAGNOSIS — R52 Pain, unspecified: Secondary | ICD-10-CM | POA: Diagnosis not present

## 2022-12-29 DIAGNOSIS — E039 Hypothyroidism, unspecified: Secondary | ICD-10-CM | POA: Diagnosis not present

## 2022-12-29 DIAGNOSIS — D631 Anemia in chronic kidney disease: Secondary | ICD-10-CM | POA: Diagnosis not present

## 2022-12-29 DIAGNOSIS — Z992 Dependence on renal dialysis: Secondary | ICD-10-CM | POA: Diagnosis not present

## 2022-12-31 DIAGNOSIS — L299 Pruritus, unspecified: Secondary | ICD-10-CM | POA: Diagnosis not present

## 2022-12-31 DIAGNOSIS — Z992 Dependence on renal dialysis: Secondary | ICD-10-CM | POA: Diagnosis not present

## 2022-12-31 DIAGNOSIS — E039 Hypothyroidism, unspecified: Secondary | ICD-10-CM | POA: Diagnosis not present

## 2022-12-31 DIAGNOSIS — D631 Anemia in chronic kidney disease: Secondary | ICD-10-CM | POA: Diagnosis not present

## 2022-12-31 DIAGNOSIS — E1040 Type 1 diabetes mellitus with diabetic neuropathy, unspecified: Secondary | ICD-10-CM | POA: Diagnosis not present

## 2022-12-31 DIAGNOSIS — N186 End stage renal disease: Secondary | ICD-10-CM | POA: Diagnosis not present

## 2022-12-31 DIAGNOSIS — N2581 Secondary hyperparathyroidism of renal origin: Secondary | ICD-10-CM | POA: Diagnosis not present

## 2022-12-31 DIAGNOSIS — R52 Pain, unspecified: Secondary | ICD-10-CM | POA: Diagnosis not present

## 2023-01-03 DIAGNOSIS — E1040 Type 1 diabetes mellitus with diabetic neuropathy, unspecified: Secondary | ICD-10-CM | POA: Diagnosis not present

## 2023-01-03 DIAGNOSIS — L299 Pruritus, unspecified: Secondary | ICD-10-CM | POA: Diagnosis not present

## 2023-01-03 DIAGNOSIS — Z992 Dependence on renal dialysis: Secondary | ICD-10-CM | POA: Diagnosis not present

## 2023-01-03 DIAGNOSIS — N186 End stage renal disease: Secondary | ICD-10-CM | POA: Diagnosis not present

## 2023-01-03 DIAGNOSIS — E039 Hypothyroidism, unspecified: Secondary | ICD-10-CM | POA: Diagnosis not present

## 2023-01-03 DIAGNOSIS — D631 Anemia in chronic kidney disease: Secondary | ICD-10-CM | POA: Diagnosis not present

## 2023-01-03 DIAGNOSIS — N2581 Secondary hyperparathyroidism of renal origin: Secondary | ICD-10-CM | POA: Diagnosis not present

## 2023-01-03 DIAGNOSIS — R52 Pain, unspecified: Secondary | ICD-10-CM | POA: Diagnosis not present

## 2023-01-06 DIAGNOSIS — Z992 Dependence on renal dialysis: Secondary | ICD-10-CM | POA: Diagnosis not present

## 2023-01-06 DIAGNOSIS — E039 Hypothyroidism, unspecified: Secondary | ICD-10-CM | POA: Diagnosis not present

## 2023-01-06 DIAGNOSIS — E1040 Type 1 diabetes mellitus with diabetic neuropathy, unspecified: Secondary | ICD-10-CM | POA: Diagnosis not present

## 2023-01-06 DIAGNOSIS — R52 Pain, unspecified: Secondary | ICD-10-CM | POA: Diagnosis not present

## 2023-01-06 DIAGNOSIS — N186 End stage renal disease: Secondary | ICD-10-CM | POA: Diagnosis not present

## 2023-01-06 DIAGNOSIS — D631 Anemia in chronic kidney disease: Secondary | ICD-10-CM | POA: Diagnosis not present

## 2023-01-06 DIAGNOSIS — N2581 Secondary hyperparathyroidism of renal origin: Secondary | ICD-10-CM | POA: Diagnosis not present

## 2023-01-06 DIAGNOSIS — L299 Pruritus, unspecified: Secondary | ICD-10-CM | POA: Diagnosis not present

## 2023-01-08 DIAGNOSIS — N186 End stage renal disease: Secondary | ICD-10-CM | POA: Diagnosis not present

## 2023-01-08 DIAGNOSIS — N032 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: Secondary | ICD-10-CM | POA: Diagnosis not present

## 2023-01-08 DIAGNOSIS — Z992 Dependence on renal dialysis: Secondary | ICD-10-CM | POA: Diagnosis not present

## 2023-01-10 DIAGNOSIS — D631 Anemia in chronic kidney disease: Secondary | ICD-10-CM | POA: Diagnosis not present

## 2023-01-10 DIAGNOSIS — R52 Pain, unspecified: Secondary | ICD-10-CM | POA: Diagnosis not present

## 2023-01-10 DIAGNOSIS — E1040 Type 1 diabetes mellitus with diabetic neuropathy, unspecified: Secondary | ICD-10-CM | POA: Diagnosis not present

## 2023-01-10 DIAGNOSIS — N186 End stage renal disease: Secondary | ICD-10-CM | POA: Diagnosis not present

## 2023-01-10 DIAGNOSIS — N2581 Secondary hyperparathyroidism of renal origin: Secondary | ICD-10-CM | POA: Diagnosis not present

## 2023-01-10 DIAGNOSIS — Z992 Dependence on renal dialysis: Secondary | ICD-10-CM | POA: Diagnosis not present

## 2023-01-12 DIAGNOSIS — D631 Anemia in chronic kidney disease: Secondary | ICD-10-CM | POA: Diagnosis not present

## 2023-01-12 DIAGNOSIS — R52 Pain, unspecified: Secondary | ICD-10-CM | POA: Diagnosis not present

## 2023-01-12 DIAGNOSIS — N186 End stage renal disease: Secondary | ICD-10-CM | POA: Diagnosis not present

## 2023-01-12 DIAGNOSIS — N2581 Secondary hyperparathyroidism of renal origin: Secondary | ICD-10-CM | POA: Diagnosis not present

## 2023-01-12 DIAGNOSIS — E1040 Type 1 diabetes mellitus with diabetic neuropathy, unspecified: Secondary | ICD-10-CM | POA: Diagnosis not present

## 2023-01-12 DIAGNOSIS — Z992 Dependence on renal dialysis: Secondary | ICD-10-CM | POA: Diagnosis not present

## 2023-01-14 ENCOUNTER — Encounter: Payer: Self-pay | Admitting: Emergency Medicine

## 2023-01-14 ENCOUNTER — Ambulatory Visit (INDEPENDENT_AMBULATORY_CARE_PROVIDER_SITE_OTHER): Payer: 59

## 2023-01-14 ENCOUNTER — Ambulatory Visit
Admission: EM | Admit: 2023-01-14 | Discharge: 2023-01-14 | Disposition: A | Discharge status: Home / Self Care | Payer: 59

## 2023-01-14 ENCOUNTER — Encounter (HOSPITAL_COMMUNITY): Payer: Self-pay | Admitting: Hematology

## 2023-01-14 VITALS — Ht 69.0 in | Wt 120.0 lb

## 2023-01-14 DIAGNOSIS — R000 Tachycardia, unspecified: Secondary | ICD-10-CM

## 2023-01-14 DIAGNOSIS — R079 Chest pain, unspecified: Secondary | ICD-10-CM | POA: Diagnosis not present

## 2023-01-14 DIAGNOSIS — Z992 Dependence on renal dialysis: Secondary | ICD-10-CM | POA: Diagnosis not present

## 2023-01-14 DIAGNOSIS — Z0000 Encounter for general adult medical examination without abnormal findings: Secondary | ICD-10-CM

## 2023-01-14 DIAGNOSIS — Z87891 Personal history of nicotine dependence: Secondary | ICD-10-CM | POA: Diagnosis not present

## 2023-01-14 DIAGNOSIS — N186 End stage renal disease: Secondary | ICD-10-CM | POA: Diagnosis not present

## 2023-01-14 DIAGNOSIS — M940 Chondrocostal junction syndrome [Tietze]: Secondary | ICD-10-CM | POA: Diagnosis not present

## 2023-01-14 DIAGNOSIS — R109 Unspecified abdominal pain: Secondary | ICD-10-CM

## 2023-01-14 DIAGNOSIS — R0789 Other chest pain: Secondary | ICD-10-CM | POA: Diagnosis not present

## 2023-01-14 DIAGNOSIS — E1122 Type 2 diabetes mellitus with diabetic chronic kidney disease: Secondary | ICD-10-CM | POA: Diagnosis not present

## 2023-01-14 NOTE — ED Provider Notes (Signed)
[facility]    CSN: [id number] Arrival date & time: 01/14/23  0915      History   Chief Complaint No chief complaint on file.   HPI Lindsey Murillo is a 27 y.o. female.   Patient presents today companied by mother help provide the majority of history.  Reports due to history of left side/flank pain.  She reports that pain is rated 7/8 on a 0-10 pain scale, described as sharp, worse with certain activities, no alleviating factors notified.  Denies any recent fall or change in activity.  She has not tried any over-the-counter medication for symptom management.  She does report ongoing nausea and vomiting but states this is chronic and unchanged from baseline.  Denies any associated hematemesis.  She has had some diarrhea but denies any associated melena or hematochezia.  Reports 2 episodes of emesis in the past 24 hours.  She has been able to eat and drink despite symptoms.  Denies any known sick contacts.  She does report some associated shortness of breath but denies any chest pain.  She denies any recent hospitalization, immobilization, surgical procedure, travel.  She does not take any exogenous hormones.  She is a dialysis patient receiving dialysis Monday Wednesday Friday.  She is scheduled to go today.    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 10/21/2021   Diabetic autonomic neuropathy ([name])    Diabetic peripheral neuropathy ([name])    Environmental allergies    Goiter    History of blood transfusion    Hypertension    no meds currently   Hypoglycemia associated with diabetes ([name])    Musculoskeletal pain 04/20/2022   Tachycardia    Thyroiditis, autoimmune    Type 1 diabetes mellitus in patient age 73-19 years with HbA1C  goal below 7.5     Patient Active Problem List   Diagnosis Date Noted   Hypervitaminosis, B complex 12/23/2022   Insulin long-term use ([name]) 12/23/2022   Amenorrhea, secondary 10/26/2022   Vitamin D deficiency 08/26/2022   HTN (hypertension) 07/24/2022   Hyperlipidemia 07/21/2022   Non-adherence to medical treatment 01/21/2022   ESRD (end stage renal disease) ([name]) 12/18/2021   Secondary hyperparathyroidism of renal origin ([name]) 10/21/2021   Physical deconditioning 10/07/2021   Depression 09/25/2021   Anemia in ESRD (end-stage renal disease) ([name]) 09/25/2021   Nephrotic syndrome 05/27/2021   Chronic pancreatitis ([name]) 08/22/2019   Pancreatic pseudocyst/cyst 08/22/2019   GERD (gastroesophageal reflux disease) 08/22/2019   Uncontrolled type 1 diabetes mellitus with hyperglycemia ([name]) 05/12/2015   Essential hypertension, benign 11/30/2012   Asthma     Past Surgical History:  Procedure Laterality Date   AV FISTULA PLACEMENT Right 12/08/2021   Procedure: ARTERIOVENOUS  FISTULA CREATION VERSUS GRAFT;  Surgeon: Larina Earthly, MD;  Location: [facility];  Service: Vascular;  Laterality: Right;   BASCILIC VEIN TRANSPOSITION Right 01/12/2022   Procedure: RIGHT ARM SECOND STAGE BASILIC VEIN TRANSPOSITION;  Surgeon: Larina Earthly, MD;  Location: [facility];  Service: Vascular;  Laterality: Right;   BIOPSY  08/27/2016   Procedure: BIOPSY;  Surgeon: West Bali, MD;  [facility];  Service: Endoscopy;;  duodenum; gastric   BIOPSY  06/27/2019   Procedure: BIOPSY;  Surgeon: Corbin Ade,  MD;  [facility];  Service: Endoscopy;;   BIOPSY  02/09/2022   Procedure: BIOPSY;  Surgeon: Lanelle Bal, DO;  [facility];  Service: Endoscopy;;   BREAST CYST EXCISION Right 11/12/2021   Procedure: RIGHT BREAST ABSCESS INCISION AND DRAINAGE;  Surgeon: Emelia Loron, MD;  Location: [facility] OR;  Service: General;  Laterality: Right;  LMA   COLONOSCOPY     COLONOSCOPY WITH PROPOFOL  N/A 02/09/2022   Procedure: COLONOSCOPY WITH PROPOFOL;  Surgeon: Lanelle Bal, DO;  [facility];  Service: Endoscopy;  Laterality: N/A;  7:30am, asa 3, dialysis pt   ESOPHAGOGASTRODUODENOSCOPY N/A 08/27/2016   Dr. Darrick Penna: mild gastritis. Negative celiac. No obvious source for dyspepsia/diarrhea   ESOPHAGOGASTRODUODENOSCOPY (EGD) WITH PROPOFOL N/A 06/27/2019   rourk: Focal abnormality of the gastric mucosa likely due to trauma (heaving).  Biopsy showed mild gastritis, negative for H. pylori.  Esophageal dilation for history of dysphagia but normal-appearing esophagus.   ESOPHAGOGASTRODUODENOSCOPY (EGD) WITH PROPOFOL N/A 02/09/2022   Procedure: ESOPHAGOGASTRODUODENOSCOPY (EGD) WITH PROPOFOL;  Surgeon: Lanelle Bal, DO;  [facility];  Service: Endoscopy;  Laterality: N/A;   IR FLUORO GUIDE CV LINE RIGHT  10/13/2021   IR FLUORO GUIDE CV LINE RIGHT  10/19/2021   IR US GUIDE VASC ACCESS RIGHT  10/13/2021   IRRIGATION AND DEBRIDEMENT ABSCESS N/A 10/04/2021   Procedure: IRRIGATION AND DEBRIDEMENT NECK ABSCESS;  Surgeon: Axel Filler, MD;  Location: [facility] OR;  Service: General;  Laterality: N/A;    OB History     Gravida  0   Para      Term      Preterm      AB      Living  0      SAB      IAB      Ectopic      Multiple      Live Births               Home Medications    Prior to Admission medications   Medication Sig Start Date End Date Taking? Authorizing Provider  acetaminophen (TYLENOL) 325 MG tablet Take by mouth. 12/24/21   [provider]  albuterol (VENTOLIN HFA) 108 (90 Base) MCG/ACT inhaler Inhale into the lungs. 12/23/22   Hetty Blend, FNP  Blood Glucose Monitoring Suppl (ACCU-CHEK GUIDE ME) w/Device KIT 1 Piece by Does not apply route as directed. 01/21/22   Roma Kayser, MD  cetirizine HCl (ZYRTEC) 1 MG/ML solution Take 1.25 ml twice a day as needed for a runny nose or itch 12/23/22   Ambs, Norvel Richards, FNP  Continuous  Glucose Receiver (DEXCOM G7 RECEIVER) DEVI by Does not apply route.    [provider]  Continuous Glucose Sensor (DEXCOM G7 SENSOR) MISC by Does not apply route.    [provider]  fluticasone (FLONASE) 50 MCG/ACT nasal spray Place 2 sprays into both nostrils daily as needed for allergies or rhinitis. 12/23/22   Hetty Blend, FNP  Fluticasone Furoate (ARNUITY ELLIPTA) 100 MCG/ACT AEPB Inhale 1 Dose into the lungs daily. 12/25/22   Hetty Blend, FNP  glucose blood (ACCU-CHEK GUIDE) test strip Use to monitor glucose 4 times a day as instructed 01/21/22   Roma Kayser, MD  insulin aspart (FIASP FLEXTOUCH) 100 UNIT/ML FlexTouch Pen Inject 4-7 Units into the skin 3 (three) times daily before meals. 09/21/22   Roma Kayser, MD  Insulin Glargine [facility]) 100  UNIT/ML Inject 20 Units into the skin at bedtime. 12/23/22   Roma Kayser, MD  Insulin Pen Needle 31G X 4 MM MISC Use as directed to administer insulin. 10/22/21   Rodolph Bong, MD  medroxyPROGESTERone (PROVERA) 10 MG tablet Take provera 10 mg for 10 days every 3 months 11/16/22   Adline Potter, NP  Methoxy PEG-Epoetin Beta (MIRCERA IJ) Mircera 11/01/22 10/31/23  [provider]  ondansetron (ZOFRAN-ODT) 4 MG disintegrating tablet 4mg  ODT q4 hours prn nausea/vomit 12/01/22   Linwood Dibbles, MD  oxyCODONE (OXY IR/ROXICODONE) 5 MG immediate release tablet  10/22/21   [provider]  pantoprazole (PROTONIX) 40 MG tablet Take 1 tablet (40 mg total) by mouth 2 (two) times daily before a meal. 09/16/22 09/11/23  Aida Raider, NP  rosuvastatin (CRESTOR) 10 MG tablet Take 1 tablet (10 mg total) by mouth daily. 07/20/22   [name], DO  sucroferric oxyhydroxide (VELPHORO) 500 MG chewable tablet Chew 500 mg by mouth 3 (three) times daily with meals. 11/16/21   [provider]  Vitamin D, Ergocalciferol, (DRISDOL) 1.25 MG (50000 UNIT) CAPS capsule Take 50,000 Units by mouth every 7  (seven) days.    [provider]    Family History Family History  Problem Relation Age of Onset   Alcohol abuse Paternal Grandfather    Huntington's disease Maternal Grandfather    Cancer Father        pancreatic   Diabetes Mother        Type II DM   Colon cancer Neg Hx    Colon polyps Neg Hx     Social History Social History   Tobacco Use   Smoking status: Never    Passive exposure: Never   Smokeless tobacco: Never  Vaping Use   Vaping status: Never Used  Substance Use Topics   Alcohol use: No   Drug use: No     Allergies   Patient has no known allergies.   Review of Systems Review of Systems  Constitutional:  Positive for activity change and fatigue. Negative for appetite change and fever.  Respiratory:  Positive for shortness of breath. Negative for cough.   Cardiovascular:  Negative for chest pain, palpitations and leg swelling.  Gastrointestinal:  Positive for abdominal pain, diarrhea, nausea and vomiting.  Neurological:  Negative for dizziness, light-headedness and headaches.     Physical Exam Triage Vital Signs ED Triage Vitals  Encounter Vitals Group     BP 01/14/23 0952 (!) 167/100     Systolic BP Percentile --      Diastolic BP Percentile --      Pulse Rate 01/14/23 0952 (!) 121     Resp 01/14/23 0952 18     Temp 01/14/23 0952 98.2 F (36.8 C)     Temp Source 01/14/23 0952 Oral     SpO2 01/14/23 0952 96 %     Weight --      Height --      Head Circumference --      Peak Flow --      Pain Score 01/14/23 0954 7     Pain Loc --      Pain Education --      Exclude from Growth Chart --    No data found.  Updated Vital Signs BP (!) 167/100 (BP Location: Right Arm)   Pulse (!) 121   Temp 98.2 F (36.8 C) (Oral)   Resp 18   SpO2 96%  Visual Acuity Right Eye Distance:   Left Eye Distance:   Bilateral Distance:    Right Eye Near:   Left Eye Near:    Bilateral Near:     Physical Exam Vitals reviewed.  Constitutional:       General: She is awake. She is not in acute distress.    Appearance: Normal appearance. She is well-developed. She is ill-appearing.     Comments: Very pleasant female holding her abdomen sitting in a crouched position in no acute distress  HENT:     Head: Normocephalic and atraumatic.     Mouth/Throat:     Pharynx: Uvula midline. No oropharyngeal exudate or posterior oropharyngeal erythema.  Cardiovascular:     Rate and Rhythm: Regular rhythm. Tachycardia present.     Heart sounds: Normal heart sounds, S1 normal and S2 normal. No murmur heard. Pulmonary:     Effort: Pulmonary effort is normal.     Breath sounds: Normal breath sounds. No wheezing, rhonchi or rales.     Comments: Clear to auscultation bilaterally Abdominal:     General: Bowel sounds are normal.     Palpations: Abdomen is soft.     Tenderness: There is generalized abdominal tenderness and tenderness in the left upper quadrant and left lower quadrant. There is left CVA tenderness. There is no right CVA tenderness, guarding or rebound.     Comments: Tenderness palpation throughout abdomen but worse in left abdomen.  Positive CVA tenderness.  Musculoskeletal:     Right lower leg: No edema.     Left lower leg: No edema.  Psychiatric:        Behavior: Behavior is cooperative.      UC Treatments / Results  Labs (all labs ordered are listed, but only abnormal results are displayed) Labs Reviewed - No data to display  EKG   Radiology No results found.  Procedures Procedures (including critical care time)  Medications Ordered in UC Medications - No data to display  Initial Impression / Assessment and Plan / UC Course  I have reviewed the triage vital signs and the nursing notes.  Pertinent labs & imaging results that were available during my care of the patient were reviewed by me and considered in my medical decision making (see chart for details).     Patient is ill-appearing and tachycardic.  Discussed  that given her clinical presentation and past medical history I think she needs workup faster than what we are able to obtain given our lab work take several days to return.  I am also concerned that she might need advanced imaging such as abdominal CT.  Recommended she go to the emergency room for further evaluation and management since we have limited resources in urgent care.  Patient and mother were agreeable and will go directly to [facility] for further evaluation and management.  She was stable to time of discharge and safe for private transport.  Final Clinical Impressions(s) / UC Diagnoses   Final diagnoses:  Acute left flank pain  Tachycardia     Discharge Instructions      I am concerned given the severity of your pain that we need to get answers sooner rather than later.  Please go to the emergency room for evaluation as we discussed.    ED Prescriptions   None    PDMP not reviewed this encounter.   Jeani Hawking, PA-C 01/14/23 1012

## 2023-01-14 NOTE — Discharge Instructions (Signed)
I am concerned given the severity of your pain that we need to get answers sooner rather than later.  Please go to the emergency room for evaluation as we discussed.

## 2023-01-14 NOTE — Patient Instructions (Signed)
Ms. Convey , Thank you for taking time to come for your Medicare Wellness Visit. I appreciate your ongoing commitment to your health goals. Please review the following plan we discussed and let me know if I can assist you in the future.   Referrals/Orders/Follow-Ups/Clinician Recommendations:   This is a list of the screening recommended for you and due dates:  Health Maintenance  Topic Date Due   Flu Shot  08/08/2023*   Eye exam for diabetics  03/19/2023   Complete foot exam   04/21/2023   Hemoglobin A1C  06/16/2023   Medicare Annual Wellness Visit  01/14/2024   Pap Smear  10/25/2025   Pap Smear  10/25/2025   DTaP/Tdap/Td vaccine (3 - Tdap) 04/16/2027   Hepatitis C Screening  Completed   HIV Screening  Completed   HPV Vaccine  Aged Out   COVID-19 Vaccine  Discontinued  *Topic was postponed. The date shown is not the original due date.    Advanced directives: (ACP Link)Information on Advanced Care Planning can be found at [facility] Advance Health Care Directives Advance Health Care Directives ([email or web address]   Next Medicare Annual Wellness Visit scheduled for next year: Yes  Preventive Care 28-43 Years Old, Female Preventive care refers to lifestyle choices and visits with your health care provider that can promote health and wellness. Preventive care visits are also called wellness exams. What can I expect for my preventive care visit? Counseling During your preventive care visit, your health care provider may ask about your: Medical history, including: Past medical problems. Family medical history. Pregnancy history. Current health, including: Menstrual cycle. Method of birth control. Emotional well-being. Home life and relationship well-being. Sexual activity and sexual health. Lifestyle, including: Alcohol, nicotine or tobacco, and drug use. Access to firearms. Diet, exercise, and sleep habits. Work and work Astronomer. Sunscreen use. Safety  issues such as seatbelt and bike helmet use. Physical exam Your health care provider may check your: Height and weight. These may be used to calculate your BMI (body mass index). BMI is a measurement that tells if you are at a healthy weight. Waist circumference. This measures the distance around your waistline. This measurement also tells if you are at a healthy weight and may help predict your risk of certain diseases, such as type 2 diabetes and high blood pressure. Heart rate and blood pressure. Body temperature. Skin for abnormal spots. What immunizations do I need?  Vaccines are usually given at various ages, according to a schedule. Your health care provider will recommend vaccines for you based on your age, medical history, and lifestyle or other factors, such as travel or where you work. What tests do I need? Screening Your health care provider may recommend screening tests for certain conditions. This may include: Pelvic exam and Pap test. Lipid and cholesterol levels. Diabetes screening. This is done by checking your blood sugar (glucose) after you have not eaten for a while (fasting). Hepatitis B test. Hepatitis C test. HIV (human immunodeficiency virus) test. STI (sexually transmitted infection) testing, if you are at risk. BRCA-related cancer screening. This may be done if you have a family history of breast, ovarian, tubal, or peritoneal cancers. Talk with your health care provider about your test results, treatment options, and if necessary, the need for more tests. Follow these instructions at home: Eating and drinking  Eat a healthy diet that includes fresh fruits and vegetables, whole grains, lean protein, and low-fat dairy products. Take vitamin and mineral supplements as recommended  by your health care provider. Do not drink alcohol if: Your health care provider tells you not to drink. You are pregnant, may be pregnant, or are planning to become pregnant. If you  drink alcohol: Limit how much you have to 0-1 drink a day. Know how much alcohol is in your drink. In the [place] one drink equals one 12 oz bottle of beer (355 mL), one 5 oz glass of wine (148 mL), or one 1 oz glass of hard liquor (44 mL). Lifestyle Brush your teeth every morning and night with fluoride toothpaste. Floss one time each day. Exercise for at least 30 minutes 5 or more days each week. Do not use any products that contain nicotine or tobacco. These products include cigarettes, chewing tobacco, and vaping devices, such as e-cigarettes. If you need help quitting, ask your health care provider. Do not use drugs. If you are sexually active, practice safe sex. Use a condom or other form of protection to prevent STIs. If you do not wish to become pregnant, use a form of birth control. If you plan to become pregnant, see your health care provider for a prepregnancy visit. Find healthy ways to manage stress, such as: Meditation, yoga, or listening to music. Journaling. Talking to a trusted person. Spending time with friends and family. Minimize exposure to UV radiation to reduce your risk of skin cancer. Safety Always wear your seat belt while driving or riding in a vehicle. Do not drive: If you have been drinking alcohol. Do not ride with someone who has been drinking. If you have been using any mind-altering substances or drugs. While texting. When you are tired or distracted. Wear a helmet and other protective equipment during sports activities. If you have firearms in your house, make sure you follow all gun safety procedures. Seek help if you have been physically or sexually abused. What's next? Go to your health care provider once a year for an annual wellness visit. Ask your health care provider how often you should have your eyes and teeth checked. Stay up to date on all vaccines. This information is not intended to replace advice given to you by your health care provider.  Make sure you discuss any questions you have with your health care provider. Document Revised: 10/22/2020 Document Reviewed: 10/22/2020 Elsevier Patient Education  2024 [employer] Managing Pain Without Opioids Opioids are strong medicines used to treat moderate to severe pain. For some people, especially those who have long-term (chronic) pain, opioids may not be the best choice for pain management due to: Side effects like nausea, constipation, and sleepiness. The risk of addiction (opioid use disorder). The longer you take opioids, the greater your risk of addiction. Pain that lasts for more than 3 months is called chronic pain. Managing chronic pain usually requires more than one approach and is often provided by a team of health care providers working together (multidisciplinary approach). Pain management may be done at a pain management center or pain clinic. How to manage pain without the use of opioids Use non-opioid medicines Non-opioid medicines for pain may include: Over-the-counter or prescription non-steroidal anti-inflammatory drugs (NSAIDs). These may be the first medicines used for pain. They work well for muscle and bone pain, and they reduce swelling. Acetaminophen. This over-the-counter medicine may work well for milder pain but not swelling. Antidepressants. These may be used to treat chronic pain. A certain type of antidepressant (tricyclics) is often used. These medicines are given in lower doses for pain than when  used for depression. Anticonvulsants. These are usually used to treat seizures but may also reduce nerve (neuropathic) pain. Muscle relaxants. These relieve pain caused by sudden muscle tightening (spasms). You may also use a pain medicine that is applied to the skin as a patch, cream, or gel (topical analgesic), such as a numbing medicine. These may cause fewer side effects than medicines taken by mouth. Do certain therapies as directed Some therapies can help  with pain management. They include: Physical therapy. You will do exercises to gain strength and flexibility. A physical therapist may teach you exercises to move and stretch parts of your body that are weak, stiff, or painful. You can learn these exercises at physical therapy visits and practice them at home. Physical therapy may also involve: Massage. Heat wraps or applying heat or cold to affected areas. Electrical signals that interrupt pain signals (transcutaneous electrical nerve stimulation, TENS). Weak lasers that reduce pain and swelling (low-level laser therapy). Signals from your body that help you learn to regulate pain (biofeedback). Occupational therapy. This helps you to learn ways to function at home and work with less pain. Recreational therapy. This involves trying new activities or hobbies, such as a physical activity or drawing. Mental health therapy, including: Cognitive behavioral therapy (CBT). This helps you learn coping skills for dealing with pain. Acceptance and commitment therapy (ACT) to change the way you think and react to pain. Relaxation therapies, including muscle relaxation exercises and mindfulness-based stress reduction. Pain management counseling. This may be individual, family, or group counseling.  Receive medical treatments Medical treatments for pain management include: Nerve block injections. These may include a pain blocker and anti-inflammatory medicines. You may have injections: Near the spine to relieve chronic back or neck pain. Into joints to relieve back or joint pain. Into nerve areas that supply a painful area to relieve body pain. Into muscles (trigger point injections) to relieve some painful muscle conditions. A medical device placed near your spine to help block pain signals and relieve nerve pain or chronic back pain (spinal cord stimulation device). Acupuncture. Follow these instructions at home Medicines Take over-the-counter and  prescription medicines only as told by your health care provider. If you are taking pain medicine, ask your health care providers about possible side effects to watch out for. Do not drive or use heavy machinery while taking prescription opioid pain medicine. Lifestyle  Do not use drugs or alcohol to reduce pain. If you drink alcohol, limit how much you have to: 0-1 drink a day for women who are not pregnant. 0-2 drinks a day for men. Know how much alcohol is in a drink. In the [place] one drink equals one 12 oz bottle of beer (355 mL), one 5 oz glass of wine (148 mL), or one 1 oz glass of hard liquor (44 mL). Do not use any products that contain nicotine or tobacco. These products include cigarettes, chewing tobacco, and vaping devices, such as e-cigarettes. If you need help quitting, ask your health care provider. Eat a healthy diet and maintain a healthy weight. Poor diet and excess weight may make pain worse. Eat foods that are high in fiber. These include fresh fruits and vegetables, whole grains, and beans. Limit foods that are high in fat and processed sugars, such as fried and sweet foods. Exercise regularly. Exercise lowers stress and may help relieve pain. Ask your health care provider what activities and exercises are safe for you. If your health care provider approves, join an exercise class  that combines movement and stress reduction. Examples include yoga and tai chi. Get enough sleep. Lack of sleep may make pain worse. Lower stress as much as possible. Practice stress reduction techniques as told by your therapist. General instructions Work with all your pain management providers to find the treatments that work best for you. You are an important member of your pain management team. There are many things you can do to reduce pain on your own. Consider joining an online or in-person support group for people who have chronic pain. Keep all follow-up visits. This is important. Where  to find more information You can find more information about managing pain without opioids from: American Academy of Pain Medicine: painmed.org Institute for Chronic Pain: instituteforchronicpain.org American Chronic Pain Association: theacpa.org Contact a health care provider if: You have side effects from pain medicine. Your pain gets worse or does not get better with treatments or home therapy. You are struggling with anxiety or depression. Summary Many types of pain can be managed without opioids. Chronic pain may respond better to pain management without opioids. Pain is best managed when you and a team of health care providers work together. Pain management without opioids may include non-opioid medicines, medical treatments, physical therapy, mental health therapy, and lifestyle changes. Tell your health care providers if your pain gets worse or is not being managed well enough. This information is not intended to replace advice given to you by your health care provider. Make sure you discuss any questions you have with your health care provider. Document Revised: 08/06/2020 Document Reviewed: 08/06/2020 Elsevier Patient Education  2024 [employer].

## 2023-01-14 NOTE — Progress Notes (Signed)
 Because this visit was a virtual/telehealth visit,  certain criteria was not obtained, such a blood pressure, CBG if patient is a diabetic, and timed get up and go. Any medications not marked as "taking" was not mentioned during the medication reconciliation part of the visit. Any vitals not documented were not able to be obtained due to this being a telehealth visit. Vitals that have been documented are verbally provided by the patient.  Patient was unable to self-report a recent blood pressure reading due to a lack of equipment at home via telehealth.  Subjective:   Lindsey Murillo is a 27 y.o. female who presents for an Initial Medicare Annual Wellness Visit.  Visit Complete: Virtual  I connected with  Bayleigh Laspisa Yerger on 01/14/23 by a audio enabled telemedicine application and verified that I am speaking with the correct person using two identifiers.  Patient Location: Home  Provider Location: Home Office  I discussed the limitations of evaluation and management by telemedicine. The patient expressed understanding and agreed to proceed.  Patient Medicare AWV questionnaire was completed by the patient on na; I have confirmed that all information answered by patient is correct and no changes since this date.  Review of Systems     Cardiac Risk Factors include: advanced age (>39men, >61 women);diabetes mellitus;dyslipidemia;hypertension;Other (see comment), Risk factor comments: ESRD     Objective:    Today's Vitals   01/14/23 1139 01/14/23 1142  Weight: 120 lb (54.4 kg)   Height: 5\' 9"  (1.753 m)   PainSc:  7    Body mass index is 17.72 kg/m.     01/14/2023   11:39 AM 12/01/2022   10:55 AM 12/01/2022   10:12 AM 06/12/2022    1:56 PM 05/27/2022    4:27 PM 02/09/2022    7:12 AM 02/02/2022    4:13 PM  Advanced Directives  Does Patient Have a Medical Advance Directive? No No No No No No No  Would patient like information on creating a medical advance directive? No - Patient  declined     No - Patient declined No - Patient declined    Current Medications (verified) Outpatient Encounter Medications as of 01/14/2023  Medication Sig   acetaminophen (TYLENOL) 325 MG tablet Take by mouth.   albuterol (VENTOLIN HFA) 108 (90 Base) MCG/ACT inhaler Inhale into the lungs.   Blood Glucose Monitoring Suppl (ACCU-CHEK GUIDE ME) w/Device KIT 1 Piece by Does not apply route as directed.   cetirizine HCl (ZYRTEC) 1 MG/ML solution Take 1.25 ml twice a day as needed for a runny nose or itch   Continuous Glucose Receiver (DEXCOM G7 RECEIVER) DEVI by Does not apply route.   Continuous Glucose Sensor (DEXCOM G7 SENSOR) MISC by Does not apply route.   fluticasone (FLONASE) 50 MCG/ACT nasal spray Place 2 sprays into both nostrils daily as needed for allergies or rhinitis.   Fluticasone Furoate (ARNUITY ELLIPTA) 100 MCG/ACT AEPB Inhale 1 Dose into the lungs daily.   glucose blood (ACCU-CHEK GUIDE) test strip Use to monitor glucose 4 times a day as instructed   insulin aspart (FIASP FLEXTOUCH) 100 UNIT/ML FlexTouch Pen Inject 4-7 Units into the skin 3 (three) times daily before meals.   Insulin Glargine (BASAGLAR KWIKPEN) 100 UNIT/ML Inject 20 Units into the skin at bedtime.   Insulin Pen Needle 31G X 4 MM MISC Use as directed to administer insulin.   medroxyPROGESTERone (PROVERA) 10 MG tablet Take provera 10 mg for 10 days every 3 months   pantoprazole (  PROTONIX) 40 MG tablet Take 1 tablet (40 mg total) by mouth 2 (two) times daily before a meal.   rosuvastatin (CRESTOR) 10 MG tablet Take 1 tablet (10 mg total) by mouth daily.   sucroferric oxyhydroxide (VELPHORO) 500 MG chewable tablet Chew 500 mg by mouth 3 (three) times daily with meals.   Vitamin D, Ergocalciferol, (DRISDOL) 1.25 MG (50000 UNIT) CAPS capsule Take 50,000 Units by mouth every 7 (seven) days.   Methoxy PEG-Epoetin Beta (MIRCERA IJ) Mircera (Patient not taking: Reported on 01/14/2023)   ondansetron (ZOFRAN-ODT) 4 MG  disintegrating tablet 4mg  ODT q4 hours prn nausea/vomit (Patient not taking: Reported on 01/14/2023)   oxyCODONE (OXY IR/ROXICODONE) 5 MG immediate release tablet  (Patient not taking: Reported on 01/14/2023)   No facility-administered encounter medications on file as of 01/14/2023.    Allergies (verified) Patient has no known allergies.   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 10/21/2021   Diabetic autonomic neuropathy ([name])    Diabetic peripheral neuropathy ([name])    Environmental allergies    Goiter    History of blood transfusion    Hypertension    no meds currently   Hypoglycemia associated with diabetes ([name])    Musculoskeletal pain 04/20/2022   Tachycardia    Thyroiditis, autoimmune    Type 1 diabetes mellitus in patient age 50-19 years with HbA1C goal below 7.5    Past Surgical History:  Procedure Laterality Date   AV FISTULA PLACEMENT Right 12/08/2021   Procedure: ARTERIOVENOUS  FISTULA CREATION VERSUS GRAFT;  Surgeon: Larina Earthly, MD;  Location: [facility];  Service: Vascular;  Laterality: Right;   BASCILIC VEIN TRANSPOSITION Right 01/12/2022   Procedure: RIGHT ARM SECOND STAGE BASILIC VEIN TRANSPOSITION;  Surgeon: Larina Earthly, MD;  Location: [facility];  Service: Vascular;  Laterality: Right;   BIOPSY  08/27/2016   Procedure: BIOPSY;  Surgeon: West Bali, MD;  [facility];  Service: Endoscopy;;  duodenum; gastric   BIOPSY  06/27/2019   Procedure: BIOPSY;  Surgeon: Corbin Ade, MD;  [facility];  Service: Endoscopy;;   BIOPSY  02/09/2022   Procedure: BIOPSY;  Surgeon: Lanelle Bal, DO;  [facility];  Service: Endoscopy;;   BREAST CYST EXCISION Right 11/12/2021   Procedure: RIGHT BREAST  ABSCESS INCISION AND DRAINAGE;  Surgeon: Emelia Loron, MD;  Location[facility] OR;  Service: General;  Laterality: Right;  LMA   COLONOSCOPY     COLONOSCOPY WITH PROPOFOL N/A 02/09/2022   Procedure: COLONOSCOPY WITH PROPOFOL;  Surgeon: Lanelle Bal, DO;  [facility];  Service: Endoscopy;  Laterality: N/A;  7:30am, asa 3, dialysis pt   ESOPHAGOGASTRODUODENOSCOPY N/A 08/27/2016   Dr. Darrick Penna: mild gastritis. Negative celiac. No obvious source for dyspepsia/diarrhea   ESOPHAGOGASTRODUODENOSCOPY (EGD) WITH PROPOFOL N/A 06/27/2019   rourk: Focal abnormality of the gastric mucosa likely due to trauma (heaving).  Biopsy showed mild gastritis, negative for H. pylori.  Esophageal dilation for history of dysphagia but normal-appearing esophagus.   ESOPHAGOGASTRODUODENOSCOPY (EGD) WITH PROPOFOL N/A 02/09/2022   Procedure: ESOPHAGOGASTRODUODENOSCOPY (EGD) WITH PROPOFOL;  Surgeon: Lanelle Bal, DO;  [facility];  Service: Endoscopy;  Laterality: N/A;   IR FLUORO GUIDE CV LINE RIGHT  10/13/2021   IR FLUORO GUIDE CV LINE RIGHT  10/19/2021   IR US GUIDE VASC ACCESS RIGHT  10/13/2021   IRRIGATION AND DEBRIDEMENT ABSCESS N/A 10/04/2021   Procedure: IRRIGATION AND DEBRIDEMENT NECK ABSCESS;  Surgeon: Axel Filler, MD;  Location[facility] OR;  Service: General;  Laterality: N/A;   Family History  Problem Relation Age of Onset   Alcohol abuse Paternal Grandfather    Huntington's disease Maternal Grandfather    Cancer Father        pancreatic   Diabetes Mother        Type II DM   Colon cancer Neg Hx    Colon polyps Neg Hx    Social History   Socioeconomic History   Marital status: Single    Spouse name: Not on file   Number of children: 0   Years of education: Not on file   Highest education level: High school graduate  Occupational History   Occupation: unemployed  Tobacco Use   Smoking status: Never    Passive exposure: Never   Smokeless tobacco: Never  Vaping Use   Vaping  status: Never Used  Substance and Sexual Activity   Alcohol use: No   Drug use: No   Sexual activity: Not Currently    Birth control/protection: Abstinence, None  Other Topics Concern   Not on file  Social History Narrative   Not on file   Social Determinants of Health   Financial Resource Strain: Low Risk  (01/14/2023)   Overall Financial Resource Strain (CARDIA)    Difficulty of Paying Living Expenses: Not hard at all  Food Insecurity: No Food Insecurity (01/14/2023)   Hunger Vital Sign    Worried About Running Out of Food in the Last Year: Never true    Ran Out of Food in the Last Year: Never true  Transportation Needs: No Transportation Needs (01/14/2023)   PRAPARE - Administrator, Civil Service (Medical): No    Lack of Transportation (Non-Medical): No  Physical Activity: Insufficiently Active (01/14/2023)   Exercise Vital Sign    Days of Exercise per Week: 4 days    Minutes of Exercise per Session: 30 min  Stress: No Stress Concern Present (01/14/2023)   [employer] of Occupational Health - Occupational Stress Questionnaire    Feeling of Stress : Only a little  Social Connections: Moderately Isolated (01/14/2023)   Social Connection and Isolation Panel [NHANES]    Frequency of Communication with Friends and Family: More than three times a week    Frequency of Social Gatherings with Friends and Family: More than three times a week    Attends Religious Services: More than 4 times per year    Active Member of [employer] or Organizations: No    Attends Banker Meetings: Never    Marital Status: Never married    Tobacco Counseling Counseling given: Not Answered   Clinical Intake:  Pre-visit preparation completed: Yes  Pain : 0-10 Pain Score: 7  Pain Type: Acute pain Pain Location: Rib cage Pain Orientation: Left Pain Descriptors / Indicators: Sharp Pain Onset: In the past 7 days Pain Frequency: Constant     BMI - recorded: 17.72 Nutritional  Status: BMI <19  Underweight Nutritional Risks: None Diabetes: Yes CBG done?: No (telehealth visit. unable to obtain cbg. patient wears a continous glucose monitor) Did pt. bring in CBG monitor from home?: No  How often do you need to have someone help you when you read instructions, pamphlets, or  other written materials from your doctor or pharmacy?: 1 - Never  Interpreter Needed?: No  Information entered by ::  Evaleen Sant, CMA   Activities of Daily Living    01/14/2023   11:51 AM  In your present state of health, do you have any difficulty performing the following activities:  Hearing? 0  Vision? 0  Difficulty concentrating or making decisions? 0  Walking or climbing stairs? 0  Dressing or bathing? 0  Doing errands, shopping? 0  Preparing Food and eating ? N  Using the Toilet? N  In the past six months, have you accidently leaked urine? N  Do you have problems with loss of bowel control? N  Managing your Medications? N  Managing your Finances? N  Housekeeping or managing your Housekeeping? N    Patient Care Team: [name], DO as PCP - General (Family Medicine) Maxwell Caul, MD as Consulting Physician (Internal Medicine) Lanelle Bal, DO as Consulting Physician (Internal Medicine) Doreatha Massed, MD as Medical Oncologist (Hematology)  Indicate any recent Medical Services you may have received from other than [facility] providers in the past year (date may be approximate).     Assessment:   This is a routine wellness examination for Sayana.  Hearing/Vision screen Hearing Screening - Comments:: Patient denies any hearing difficulties.   Vision Screening - Comments:: Patient has no vision difficulties and [facility] is patient's eye care provider.    Goals Addressed             This Visit's Progress    Patient Stated       Get my A1C under control, under 8.0, so that I can get a kidney transplant.        Depression Screen     01/14/2023   11:46 AM 10/26/2022   10:23 AM 07/20/2022    1:55 PM 04/20/2022    1:20 PM 09/24/2021    1:59 PM 08/29/2019    4:19 PM 07/17/2019    6:20 PM  PHQ 2/9 Scores  PHQ - 2 Score 0 1 2 1  0 0 0  PHQ- 9 Score  5 6 4        Fall Risk    01/14/2023   11:51 AM 10/26/2022   10:20 AM 07/20/2022    1:55 PM 08/29/2019    4:19 PM 07/10/2019    1:46 PM  Fall Risk   Falls in the past year? 0 0 1 0 0  Comment     she denies falls  Number falls in past yr: 0 0 0  0  Injury with Fall? 0 0 0  0  Risk for fall due to : Impaired balance/gait;Impaired mobility      Follow up Falls prevention discussed;Education provided   Falls evaluation completed Falls evaluation completed;Falls prevention discussed    MEDICARE RISK AT HOME: Medicare Risk at Home Any stairs in or around the home?: Yes If so, are there any without handrails?: No Home free of loose throw rugs in walkways, pet beds, electrical cords, etc?: Yes Adequate lighting in your home to reduce risk of falls?: Yes Life alert?: No Use of a cane, walker or w/c?: Yes Grab bars in the bathroom?: No Shower chair or bench in shower?: No Elevated toilet seat or a handicapped toilet?: No  TIMED UP AND GO:  Was the test performed? No    Cognitive Function:        01/14/2023   11:46 AM  6CIT Screen  What  Year? 0 points  What month? 0 points  What time? 0 points  Count back from 20 0 points  Months in reverse 0 points  Repeat phrase 0 points  Total Score 0 points    Immunizations Immunization History  Administered Date(s) Administered   Hepb-cpg 10/30/2021, 11/25/2021, 12/23/2021, 02/24/2022   Influenza,inj,Quad PF,6+ Mos 03/13/2013, 02/25/2014, 04/09/2016, 01/26/2017, 01/18/2019, 06/15/2021, 02/08/2022   PNEUMOCOCCAL CONJUGATE-20 10/28/2021   Td 04/15/2017   Td (Adult),5 Lf Tetanus Toxid, Preservative Free 04/15/2017    TDAP status: Up to date  Flu Vaccine status: Declined, Education has been provided regarding the importance of  this vaccine but patient still declined. Advised may receive this vaccine at local pharmacy or Health Dept. Aware to provide a copy of the vaccination record if obtained from local pharmacy or Health Dept. Verbalized acceptance and understanding.  Pneumococcal vaccine status: Not age appropriate for this patient.   Covid-19 vaccine status: Declined, Education has been provided regarding the importance of this vaccine but patient still declined. Advised may receive this vaccine at local pharmacy or Health Dept.or vaccine clinic. Aware to provide a copy of the vaccination record if obtained from local pharmacy or Health Dept. Verbalized acceptance and understanding.  Qualifies for Shingles Vaccine? No. Not age appropriate for this patient   Screening Tests Health Maintenance  Topic Date Due   Medicare Annual Wellness (AWV)  Never done   INFLUENZA VACCINE  08/08/2023 (Originally 12/09/2022)   OPHTHALMOLOGY EXAM  03/19/2023   FOOT EXAM  04/21/2023   HEMOGLOBIN A1C  06/16/2023   PAP-Cervical Cytology Screening  10/25/2025   PAP SMEAR-Modifier  10/25/2025   DTaP/Tdap/Td (3 - Tdap) 04/16/2027   Hepatitis C Screening  Completed   HIV Screening  Completed   HPV VACCINES  Aged Out   COVID-19 Vaccine  Discontinued    Health Maintenance  Health Maintenance Due  Topic Date Due   Medicare Annual Wellness (AWV)  Never done    Colorectal Cancer Screenings: Not age appropriate for this patient.    Mammogram: Not age appropriate for this patient   Bone Density Screening: Not age appropriate for this patient.   Lung Cancer Screening: (Low Dose CT Chest recommended if Age 58-80 years, 20 pack-year currently smoking OR have quit w/in 15years.) does not qualify.   Lung Cancer Screening Referral: na  Additional Screening:  Hepatitis C Screening: does not qualify; Completed 10/13/2021  Vision Screening: Recommended annual ophthalmology exams for early detection of glaucoma and other disorders of  the eye. Is the patient up to date with their annual eye exam?  Yes  Who is the provider or what is the name of the office in which the patient attends annual eye exams? [facility] If pt is not established with a provider, would they like to be referred to a provider to establish care? No .   Dental Screening: Recommended annual dental exams for proper oral hygiene  Diabetic Foot Exam: Diabetic Foot Exam: Completed 04/20/2022  Community Resource Referral / Chronic Care Management: CRR required this visit?  No   CCM required this visit?  No     Plan:     I have personally reviewed and noted the following in the patient's chart:   Medical and social history Use of alcohol, tobacco or illicit drugs  Current medications and supplements including opioid prescriptions. Patient is currently taking opioid prescriptions. Information provided to patient regarding non-opioid alternatives. Patient advised to discuss non-opioid treatment plan with their provider. Functional ability  and status Nutritional status Physical activity Advanced directives List of other physicians Hospitalizations, surgeries, and ER visits in previous 12 months Vitals Screenings to include cognitive, depression, and falls Referrals and appointments  In addition, I have reviewed and discussed with patient certain preventive protocols, quality metrics, and best practice recommendations. A written personalized care plan for preventive services as well as general preventive health recommendations were provided to patient.     Jordan Hawks Kery Batzel, CMA   01/14/2023   After Visit Summary: (MyChart) Due to this being a telephonic visit, the after visit summary with patients personalized plan was offered to patient via MyChart   Nurse Notes:

## 2023-01-14 NOTE — ED Triage Notes (Signed)
Left side pain x 2 days.  Has nausea and some diarrhea off and on, but states this is normal for her.

## 2023-01-14 NOTE — ED Notes (Signed)
Patient is being discharged from the Urgent Care and sent to the Emergency Department via private vehicle . Per PA, patient is in need of higher level of care due to ABD pain and tachycardia. Patient is aware and verbalizes understanding of plan of care.  Vitals:   01/14/23 0952  BP: (!) 167/100  Pulse: (!) 121  Resp: 18  Temp: 98.2 F (36.8 C)  SpO2: 96%

## 2023-01-15 DIAGNOSIS — N2581 Secondary hyperparathyroidism of renal origin: Secondary | ICD-10-CM | POA: Diagnosis not present

## 2023-01-15 DIAGNOSIS — R52 Pain, unspecified: Secondary | ICD-10-CM | POA: Diagnosis not present

## 2023-01-15 DIAGNOSIS — N186 End stage renal disease: Secondary | ICD-10-CM | POA: Diagnosis not present

## 2023-01-15 DIAGNOSIS — D631 Anemia in chronic kidney disease: Secondary | ICD-10-CM | POA: Diagnosis not present

## 2023-01-15 DIAGNOSIS — E1040 Type 1 diabetes mellitus with diabetic neuropathy, unspecified: Secondary | ICD-10-CM | POA: Diagnosis not present

## 2023-01-15 DIAGNOSIS — Z992 Dependence on renal dialysis: Secondary | ICD-10-CM | POA: Diagnosis not present

## 2023-01-17 ENCOUNTER — Encounter (HOSPITAL_COMMUNITY): Payer: Self-pay | Admitting: Hematology

## 2023-01-17 DIAGNOSIS — N2581 Secondary hyperparathyroidism of renal origin: Secondary | ICD-10-CM | POA: Diagnosis not present

## 2023-01-17 DIAGNOSIS — N186 End stage renal disease: Secondary | ICD-10-CM | POA: Diagnosis not present

## 2023-01-17 DIAGNOSIS — E1040 Type 1 diabetes mellitus with diabetic neuropathy, unspecified: Secondary | ICD-10-CM | POA: Diagnosis not present

## 2023-01-17 DIAGNOSIS — D631 Anemia in chronic kidney disease: Secondary | ICD-10-CM | POA: Diagnosis not present

## 2023-01-17 DIAGNOSIS — Z992 Dependence on renal dialysis: Secondary | ICD-10-CM | POA: Diagnosis not present

## 2023-01-17 DIAGNOSIS — R52 Pain, unspecified: Secondary | ICD-10-CM | POA: Diagnosis not present

## 2023-01-19 DIAGNOSIS — D631 Anemia in chronic kidney disease: Secondary | ICD-10-CM | POA: Diagnosis not present

## 2023-01-19 DIAGNOSIS — R52 Pain, unspecified: Secondary | ICD-10-CM | POA: Diagnosis not present

## 2023-01-19 DIAGNOSIS — N186 End stage renal disease: Secondary | ICD-10-CM | POA: Diagnosis not present

## 2023-01-19 DIAGNOSIS — Z7682 Awaiting organ transplant status: Secondary | ICD-10-CM | POA: Diagnosis not present

## 2023-01-19 DIAGNOSIS — E1040 Type 1 diabetes mellitus with diabetic neuropathy, unspecified: Secondary | ICD-10-CM | POA: Diagnosis not present

## 2023-01-19 DIAGNOSIS — Z992 Dependence on renal dialysis: Secondary | ICD-10-CM | POA: Diagnosis not present

## 2023-01-19 DIAGNOSIS — E1121 Type 2 diabetes mellitus with diabetic nephropathy: Secondary | ICD-10-CM | POA: Diagnosis not present

## 2023-01-19 DIAGNOSIS — N2581 Secondary hyperparathyroidism of renal origin: Secondary | ICD-10-CM | POA: Diagnosis not present

## 2023-01-21 DIAGNOSIS — D631 Anemia in chronic kidney disease: Secondary | ICD-10-CM | POA: Diagnosis not present

## 2023-01-21 DIAGNOSIS — N186 End stage renal disease: Secondary | ICD-10-CM | POA: Diagnosis not present

## 2023-01-21 DIAGNOSIS — N2581 Secondary hyperparathyroidism of renal origin: Secondary | ICD-10-CM | POA: Diagnosis not present

## 2023-01-21 DIAGNOSIS — R52 Pain, unspecified: Secondary | ICD-10-CM | POA: Diagnosis not present

## 2023-01-21 DIAGNOSIS — E1040 Type 1 diabetes mellitus with diabetic neuropathy, unspecified: Secondary | ICD-10-CM | POA: Diagnosis not present

## 2023-01-21 DIAGNOSIS — Z992 Dependence on renal dialysis: Secondary | ICD-10-CM | POA: Diagnosis not present

## 2023-01-24 DIAGNOSIS — R52 Pain, unspecified: Secondary | ICD-10-CM | POA: Diagnosis not present

## 2023-01-24 DIAGNOSIS — D631 Anemia in chronic kidney disease: Secondary | ICD-10-CM | POA: Diagnosis not present

## 2023-01-24 DIAGNOSIS — N2581 Secondary hyperparathyroidism of renal origin: Secondary | ICD-10-CM | POA: Diagnosis not present

## 2023-01-24 DIAGNOSIS — E1040 Type 1 diabetes mellitus with diabetic neuropathy, unspecified: Secondary | ICD-10-CM | POA: Diagnosis not present

## 2023-01-24 DIAGNOSIS — Z992 Dependence on renal dialysis: Secondary | ICD-10-CM | POA: Diagnosis not present

## 2023-01-24 DIAGNOSIS — N186 End stage renal disease: Secondary | ICD-10-CM | POA: Diagnosis not present

## 2023-01-25 ENCOUNTER — Encounter: Payer: Self-pay | Admitting: Podiatry

## 2023-01-25 ENCOUNTER — Ambulatory Visit (INDEPENDENT_AMBULATORY_CARE_PROVIDER_SITE_OTHER): Payer: 59 | Admitting: Podiatry

## 2023-01-25 DIAGNOSIS — E10621 Type 1 diabetes mellitus with foot ulcer: Secondary | ICD-10-CM | POA: Diagnosis not present

## 2023-01-25 NOTE — Progress Notes (Unsigned)
Chief Complaint  Patient presents with   Foot Pain    Rm19: patient is here for 4WK F/U for foot ulcer   HPI: 27 y.o. female presenting today for follow-up of right submet 1 ulcer.  she has been wearing her crocs shoes and not offloading the area.  There is no dressing in place today.  She states there is been no drainage.  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 10/21/2021   Diabetic autonomic neuropathy ([name])    Diabetic peripheral neuropathy ([name])    Environmental allergies    Goiter    History of blood transfusion    Hypertension    no meds currently   Hypoglycemia associated with diabetes ([name])    Musculoskeletal pain 04/20/2022   Tachycardia    Thyroiditis, autoimmune    Type 1 diabetes mellitus in patient age 2-19 years with HbA1C goal below 7.5     Past Surgical History:  Procedure Laterality Date   AV FISTULA PLACEMENT Right 12/08/2021   Procedure: ARTERIOVENOUS  FISTULA CREATION VERSUS GRAFT;  Surgeon: Larina Earthly, MD;  Location: [facility];  Service: Vascular;  Laterality: Right;   BASCILIC VEIN TRANSPOSITION Right 01/12/2022   Procedure: RIGHT ARM SECOND STAGE BASILIC VEIN TRANSPOSITION;  Surgeon: Larina Earthly, MD;  Location: [facility];  Service: Vascular;  Laterality: Right;   BIOPSY  08/27/2016   Procedure: BIOPSY;  Surgeon: West Bali, MD;  [facility];  Service: Endoscopy;;  duodenum; gastric   BIOPSY  06/27/2019   Procedure: BIOPSY;  Surgeon: Corbin Ade, MD;  [facility];  Service: Endoscopy;;   BIOPSY  02/09/2022   Procedure: BIOPSY;  Surgeon: Lanelle Bal, DO;  [facility];  Service: Endoscopy;;   BREAST CYST EXCISION Right 11/12/2021   Procedure: RIGHT BREAST ABSCESS INCISION  AND DRAINAGE;  Surgeon: Emelia Loron, MD;  Location: [facility] OR;  Service: General;  Laterality: Right;  LMA   COLONOSCOPY     COLONOSCOPY WITH PROPOFOL N/A 02/09/2022   Procedure: COLONOSCOPY WITH PROPOFOL;  Surgeon: Lanelle Bal, DO;  [facility];  Service: Endoscopy;  Laterality: N/A;  7:30am, asa 3, dialysis pt   ESOPHAGOGASTRODUODENOSCOPY N/A 08/27/2016   Dr. Darrick Penna: mild gastritis. Negative celiac. No obvious source for dyspepsia/diarrhea   ESOPHAGOGASTRODUODENOSCOPY (EGD) WITH PROPOFOL N/A 06/27/2019   rourk: Focal abnormality of the gastric mucosa likely due to trauma (heaving).  Biopsy showed mild gastritis, negative for H. pylori.  Esophageal dilation for history of dysphagia but normal-appearing esophagus.   ESOPHAGOGASTRODUODENOSCOPY (EGD) WITH PROPOFOL N/A 02/09/2022   Procedure: ESOPHAGOGASTRODUODENOSCOPY (EGD) WITH PROPOFOL;  Surgeon: Lanelle Bal, DO;  [facility];  Service: Endoscopy;  Laterality: N/A;   IR FLUORO GUIDE CV LINE RIGHT  10/13/2021   IR FLUORO GUIDE CV LINE RIGHT  10/19/2021   IR US GUIDE VASC ACCESS RIGHT  10/13/2021   IRRIGATION AND DEBRIDEMENT ABSCESS N/A 10/04/2021   Procedure: IRRIGATION AND DEBRIDEMENT NECK ABSCESS;  Surgeon: Axel Filler, MD;  Location: [facility] OR;  Service: General;  Laterality: N/A;    No Known Allergies  PHYSICAL EXAM:  General: The patient is alert and oriented x3 in no acute distress.  Dermatology: Skin is warm, dry and supple  bilateral lower extremities. Interspaces are clear of maceration and debris.      Wound 1 description:  Location:  submet 1    Depth:  closed    Wound Border: Hemorrhagic callus    Wound Base: Not applicable Odor?:  None    Infected?:  No    Necrosis?:  None    Pain?:  No pain    Tunneling: None    Dimensions (cm): Not applicable  Vascular: Pedal pulses are palpable     Latest Ref Rng & Units 12/14/2022    3:46 PM 07/20/2022    2:40 PM  Hemoglobin A1C  Hemoglobin-A1c 4.8  - 5.6 % 10.5  10.5    ASSESSMENT / PLAN OF CARE: 1. Type 1 diabetes mellitus with foot ulcer (CODE) ([name])    The previously ulcerated area was sharply debrided of hyperkeratotic and any devitalized soft tissue with sterile #312 blade to the level of epidermis .  eviewed off-loading with patient.   A felt dancer's pad was placed under her shoe insole to keep pressure of the area.  If this helps prevent recurrence of ulcer, she may benefit from custom diabetic orthotics.  Will need to send to [facility] for this.   Return in about 6 weeks (around 03/08/2023) for f/u ulcer.   Clerance Lav, DPM, FACFAS [facility]     [address] [place], [place] [address]                Office [phone]  Fax [phone]

## 2023-01-26 DIAGNOSIS — Z992 Dependence on renal dialysis: Secondary | ICD-10-CM | POA: Diagnosis not present

## 2023-01-26 DIAGNOSIS — R52 Pain, unspecified: Secondary | ICD-10-CM | POA: Diagnosis not present

## 2023-01-26 DIAGNOSIS — E1040 Type 1 diabetes mellitus with diabetic neuropathy, unspecified: Secondary | ICD-10-CM | POA: Diagnosis not present

## 2023-01-26 DIAGNOSIS — D631 Anemia in chronic kidney disease: Secondary | ICD-10-CM | POA: Diagnosis not present

## 2023-01-26 DIAGNOSIS — N2581 Secondary hyperparathyroidism of renal origin: Secondary | ICD-10-CM | POA: Diagnosis not present

## 2023-01-26 DIAGNOSIS — N186 End stage renal disease: Secondary | ICD-10-CM | POA: Diagnosis not present

## 2023-01-27 ENCOUNTER — Other Ambulatory Visit: Payer: Self-pay | Admitting: "Endocrinology

## 2023-01-27 ENCOUNTER — Ambulatory Visit (INDEPENDENT_AMBULATORY_CARE_PROVIDER_SITE_OTHER): Payer: 59 | Admitting: Nurse Practitioner

## 2023-01-27 ENCOUNTER — Encounter: Payer: Self-pay | Admitting: Nurse Practitioner

## 2023-01-27 VITALS — BP 104/64 | HR 116 | Ht 68.0 in | Wt 124.6 lb

## 2023-01-27 DIAGNOSIS — Z794 Long term (current) use of insulin: Secondary | ICD-10-CM

## 2023-01-27 DIAGNOSIS — E1022 Type 1 diabetes mellitus with diabetic chronic kidney disease: Secondary | ICD-10-CM

## 2023-01-27 DIAGNOSIS — N186 End stage renal disease: Secondary | ICD-10-CM | POA: Diagnosis not present

## 2023-01-27 DIAGNOSIS — Z992 Dependence on renal dialysis: Secondary | ICD-10-CM | POA: Diagnosis not present

## 2023-01-27 MED ORDER — FIASP FLEXTOUCH 100 UNIT/ML ~~LOC~~ SOPN: 5.0000 [IU] | PEN_INJECTOR | Freq: Three times a day (TID) | SUBCUTANEOUS | 3 refills | Status: DC

## 2023-01-27 MED ORDER — TRESIBA FLEXTOUCH 100 UNIT/ML ~~LOC~~ SOPN: 15.0000 [IU] | PEN_INJECTOR | Freq: Every day | SUBCUTANEOUS | 3 refills | Status: DC

## 2023-01-27 MED ORDER — INSULIN PEN NEEDLE 31G X 4 MM MISC: 3 refills | Status: DC

## 2023-01-27 NOTE — Progress Notes (Signed)
01/27/2023, 4:05 PM  Endocrinology follow-up note   Subjective:    Patient ID: Lindsey Murillo, female    DOB: [birth year].  Lindsey Murillo is being seen in follow-up after she was seen in consultation for management of currently uncontrolled symptomatic diabetes requested by  [name], DO.   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 10/21/2021   Diabetic autonomic neuropathy ([name])    Diabetic peripheral neuropathy ([name])    Environmental allergies    Goiter    History of blood transfusion    Hypertension    no meds currently   Hypoglycemia associated with diabetes ([name])    Musculoskeletal pain 04/20/2022   Tachycardia    Thyroiditis, autoimmune    Type 1 diabetes mellitus in patient age 28-19 years with HbA1C goal below 7.5     Past Surgical History:  Procedure Laterality Date   AV FISTULA PLACEMENT Right 12/08/2021   Procedure: ARTERIOVENOUS  FISTULA CREATION VERSUS GRAFT;  Surgeon: Larina Earthly, MD;  Location: [facility];  Service: Vascular;  Laterality: Right;   BASCILIC VEIN TRANSPOSITION Right 01/12/2022   Procedure: RIGHT ARM SECOND STAGE BASILIC VEIN TRANSPOSITION;  Surgeon: Larina Earthly, MD;  Location: [facility];  Service: Vascular;  Laterality: Right;   BIOPSY  08/27/2016   Procedure: BIOPSY;  Surgeon: West Bali, MD;  Location: [facility];  Service: Endoscopy;;  duodenum; gastric   BIOPSY  06/27/2019   Procedure: BIOPSY;  Surgeon: Corbin Ade, MD;  Location: [facility];  Service: Endoscopy;;   BIOPSY  02/09/2022   Procedure: BIOPSY;  Surgeon: Lanelle Bal, DO;  Location: [facility];  Service: Endoscopy;;   BREAST CYST  EXCISION Right 11/12/2021   Procedure: RIGHT BREAST ABSCESS INCISION AND DRAINAGE;  Surgeon: Emelia Loron, MD;  Location: [facility] OR;  Service: General;  Laterality: Right;  LMA   COLONOSCOPY     COLONOSCOPY WITH PROPOFOL N/A 02/09/2022   Procedure: COLONOSCOPY WITH PROPOFOL;  Surgeon: Lanelle Bal, DO;  Location: [facility];  Service: Endoscopy;  Laterality: N/A;  7:30am, asa 3, dialysis pt   ESOPHAGOGASTRODUODENOSCOPY N/A 08/27/2016   Dr. Darrick Penna: mild gastritis. Negative celiac. No obvious source for dyspepsia/diarrhea   ESOPHAGOGASTRODUODENOSCOPY (EGD) WITH PROPOFOL N/A 06/27/2019   rourk: Focal abnormality of the gastric mucosa likely due to trauma (heaving).  Biopsy showed mild gastritis, negative for H. pylori.  Esophageal dilation for history of dysphagia but normal-appearing esophagus.   ESOPHAGOGASTRODUODENOSCOPY (EGD) WITH PROPOFOL N/A 02/09/2022   Procedure: ESOPHAGOGASTRODUODENOSCOPY (EGD) WITH PROPOFOL;  Surgeon: Lanelle Bal, DO;  [facility];  Service: Endoscopy;  Laterality: N/A;   IR FLUORO GUIDE CV LINE RIGHT  10/13/2021   IR FLUORO GUIDE CV LINE RIGHT  10/19/2021   IR US GUIDE VASC ACCESS RIGHT  10/13/2021   IRRIGATION AND DEBRIDEMENT ABSCESS  N/A 10/04/2021   Procedure: IRRIGATION AND DEBRIDEMENT NECK ABSCESS;  Surgeon: Axel Filler, MD;  Location: [facility] OR;  Service: General;  Laterality: N/A;    Social History   Socioeconomic History   Marital status: Single    Spouse name: Not on file   Number of children: 0   Years of education: Not on file   Highest education level: High school graduate  Occupational History   Occupation: unemployed  Tobacco Use   Smoking status: Never    Passive exposure: Never   Smokeless tobacco: Never  Vaping Use   Vaping status: Never Used  Substance and Sexual Activity   Alcohol use: No   Drug use: No   Sexual activity: Not Currently    Birth control/protection: Abstinence, None  Other Topics Concern   Not on file   Social History Narrative   Not on file   Social Determinants of Health   Financial Resource Strain: Low Risk  (01/14/2023)   Overall Financial Resource Strain (CARDIA)    Difficulty of Paying Living Expenses: Not hard at all  Food Insecurity: No Food Insecurity (01/14/2023)   Hunger Vital Sign    Worried About Running Out of Food in the Last Year: Never true    Ran Out of Food in the Last Year: Never true  Transportation Needs: No Transportation Needs (01/14/2023)   PRAPARE - Administrator, Civil Service (Medical): No    Lack of Transportation (Non-Medical): No  Physical Activity: Insufficiently Active (01/14/2023)   Exercise Vital Sign    Days of Exercise per Week: 4 days    Minutes of Exercise per Session: 30 min  Stress: No Stress Concern Present (01/14/2023)   [employer] of Occupational Health - Occupational Stress Questionnaire    Feeling of Stress : Only a little  Social Connections: Moderately Isolated (01/14/2023)   Social Connection and Isolation Panel [NHANES]    Frequency of Communication with Friends and Family: More than three times a week    Frequency of Social Gatherings with Friends and Family: More than three times a week    Attends Religious Services: More than 4 times per year    Active Member of [employer] or Organizations: No    Attends Engineer, structural: Never    Marital Status: Never married    Family History  Problem Relation Age of Onset   Alcohol abuse Paternal Grandfather    Huntington's disease Maternal Grandfather    Cancer Father        pancreatic   Diabetes Mother        Type II DM   Colon cancer Neg Hx    Colon polyps Neg Hx     Outpatient Encounter Medications as of 01/27/2023  Medication Sig   acetaminophen (TYLENOL) 325 MG tablet Take by mouth.   albuterol (VENTOLIN HFA) 108 (90 Base) MCG/ACT inhaler Inhale into the lungs.   Blood Glucose Monitoring Suppl (ACCU-CHEK GUIDE ME) w/Device KIT 1 Piece by Does not apply  route as directed.   cetirizine HCl (ZYRTEC) 1 MG/ML solution Take 1.25 ml twice a day as needed for a runny nose or itch   Continuous Glucose Receiver (DEXCOM G7 RECEIVER) DEVI by Does not apply route.   Continuous Glucose Sensor (DEXCOM G7 SENSOR) MISC by Does not apply route.   fluticasone (FLONASE) 50 MCG/ACT nasal spray Place 2 sprays into both nostrils daily as needed for allergies or rhinitis.   Fluticasone Furoate (ARNUITY ELLIPTA) 100 MCG/ACT AEPB Inhale  1 Dose into the lungs daily.   glucose blood (ACCU-CHEK GUIDE) test strip Use to monitor glucose 4 times a day as instructed   medroxyPROGESTERone (PROVERA) 10 MG tablet Take provera 10 mg for 10 days every 3 months   Methoxy PEG-Epoetin Beta (MIRCERA IJ)    ondansetron (ZOFRAN-ODT) 4 MG disintegrating tablet 4mg  ODT q4 hours prn nausea/vomit   oxyCODONE (OXY IR/ROXICODONE) 5 MG immediate release tablet    pantoprazole (PROTONIX) 40 MG tablet Take 1 tablet (40 mg total) by mouth 2 (two) times daily before a meal.   rosuvastatin (CRESTOR) 10 MG tablet Take 1 tablet (10 mg total) by mouth daily.   sucroferric oxyhydroxide (VELPHORO) 500 MG chewable tablet Chew 500 mg by mouth 3 (three) times daily with meals.   Vitamin D, Ergocalciferol, (DRISDOL) 1.25 MG (50000 UNIT) CAPS capsule Take 50,000 Units by mouth every 7 (seven) days.   [DISCONTINUED] FIASP FLEXTOUCH 100 UNIT/ML FlexTouch Pen INJECT 5 TO 7 UNITS SUBCUTANEOUSLY THREE TIMES DAILY BEFORE MEAL(S)   [DISCONTINUED] Insulin Glargine (BASAGLAR KWIKPEN) 100 UNIT/ML Inject 20 Units into the skin at bedtime.   [DISCONTINUED] Insulin Pen Needle 31G X 4 MM MISC Use as directed to administer insulin.   insulin aspart (FIASP FLEXTOUCH) 100 UNIT/ML FlexTouch Pen Inject 5-8 Units into the skin 3 (three) times daily before meals.   insulin degludec (TRESIBA FLEXTOUCH) 100 UNIT/ML FlexTouch Pen Inject 15 Units into the skin at bedtime.   Insulin Pen Needle 31G X 4 MM MISC Use as directed to  administer insulin 4 times daily.   [DISCONTINUED] insulin aspart (FIASP FLEXTOUCH) 100 UNIT/ML FlexTouch Pen Inject 4-7 Units into the skin 3 (three) times daily before meals.   No facility-administered encounter medications on file as of 01/27/2023.    ALLERGIES: No Known Allergies  VACCINATION STATUS: Immunization History  Administered Date(s) Administered   Hepb-cpg 10/30/2021, 11/25/2021, 12/23/2021, 02/24/2022   Influenza,inj,Quad PF,6+ Mos 03/13/2013, 02/25/2014, 04/09/2016, 01/26/2017, 01/18/2019, 06/15/2021, 02/08/2022   PNEUMOCOCCAL CONJUGATE-20 10/28/2021   Td 04/15/2017   Td (Adult),5 Lf Tetanus Toxid, Preservative Free 04/15/2017    Diabetes She presents for her follow-up diabetic visit. She has type 1 diabetes mellitus. Her disease course has been improving. There are no hypoglycemic associated symptoms. Pertinent negatives for diabetes include no polydipsia and no polyuria. There are no hypoglycemic complications. Symptoms are improving. Diabetic complications include nephropathy and peripheral neuropathy. (Non adherence to medical treatment.) Risk factors for coronary artery disease include diabetes mellitus, dyslipidemia and sedentary lifestyle. Current diabetic treatment includes intensive insulin program. She is compliant with treatment most of the time. Her weight is fluctuating minimally. She is following a generally unhealthy diet. When asked about meal planning, she reported none. She never participates in exercise. Her home blood glucose trend is decreasing steadily. Her overall blood glucose range is 140-180 mg/dl. (She presents today with her CGM showing greatly improved glycemic profile overall.  She was not due for another A1c today, recently was 10.5% on 8/6.  She is former patient of Dr. Fransico Him but her mom sees me and she wanted to try it out.  Analysis of her CGM shows TIR 62%, TAR 36%, TBR 2% with a GMI of 7.3%.  She does have some nocturnal hypoglycemia noted. ) An  ACE inhibitor/angiotensin II receptor blocker is being taken.  Hyperlipidemia This is a chronic problem. Exacerbating diseases include diabetes. Current antihyperlipidemic treatment includes statins. Risk factors for coronary artery disease include dyslipidemia, diabetes mellitus, hypertension and a sedentary lifestyle.  Hypertension This  is a chronic problem. Risk factors for coronary artery disease include dyslipidemia and diabetes mellitus. Past treatments include ACE inhibitors.    Review of systems  Constitutional: + Minimally fluctuating body weight,  current Body mass index is 18.95 kg/m. , no fatigue, no subjective hyperthermia, no subjective hypothermia Eyes: + blurry vision, no xerophthalmia ENT: no sore throat, no nodules palpated in throat, no dysphagia/odynophagia, no hoarseness Cardiovascular: no chest pain, no shortness of breath, no palpitations, no leg swelling Respiratory: no cough, no shortness of breath Gastrointestinal: no nausea/vomiting/diarrhea Musculoskeletal: walks with cane Skin: no rashes, no hyperemia Neurological: no tremors, no numbness, no tingling, no dizziness Psychiatric: no depression, no anxiety   Objective:    BP 104/64 (BP Location: Left Arm, Patient Position: Sitting, Cuff Size: Normal)   Pulse (!) 116   Ht 5\' 8"  (1.727 m)   Wt 124 lb 9.6 oz (56.5 kg)   BMI 18.95 kg/m   Wt Readings from Last 3 Encounters:  01/27/23 124 lb 9.6 oz (56.5 kg)  01/14/23 120 lb (54.4 kg)  12/23/22 119 lb 12.8 oz (54.3 kg)    BP Readings from Last 3 Encounters:  01/27/23 104/64  01/14/23 (!) 167/100  12/23/22 (!) 84/60     Physical Exam- Limited  Constitutional:  Body mass index is 18.95 kg/m. , not in acute distress, mildly anxious/distracted state of mind Eyes:  EOMI, no exophthalmos Musculoskeletal: walks with cane Skin:  no rashes, no hyperemia Neurological: no tremor with outstretched hands   Diabetic Foot Exam - Simple   No data filed       CMP ( most recent) CMP     Component Value Date/Time   NA 134 (L) 12/01/2022 1018   NA 132 (L) 09/24/2021 1453   K 3.8 12/01/2022 1018   CL 94 (L) 12/01/2022 1018   CO2 24 12/01/2022 1018   GLUCOSE 170 (H) 12/01/2022 1018   BUN 45 (H) 12/01/2022 1018   BUN 36 (H) 09/24/2021 1453   CREATININE 5.12 (H) 12/01/2022 1018   CREATININE 0.98 08/29/2019 1637   CALCIUM 8.6 (L) 12/01/2022 1018   PROT 7.9 12/01/2022 1018   PROT 5.5 (L) 09/24/2021 1453   ALBUMIN 3.8 12/01/2022 1018   ALBUMIN 2.1 (L) 09/24/2021 1453   AST 54 (H) 12/01/2022 1018   ALT 82 (H) 12/01/2022 1018   ALKPHOS 315 (H) 12/01/2022 1018   BILITOT 1.4 (H) 12/01/2022 1018   BILITOT <0.2 09/24/2021 1453   GFRNONAA 11 (L) 12/01/2022 1018   GFRAA >60 10/27/2019 2021     Diabetic Labs (most recent): Lab Results  Component Value Date   HGBA1C 10.5 (H) 12/14/2022   HGBA1C 10.5 (H) 07/20/2022   HGBA1C 8.3 (A) 01/21/2022   MICROALBUR 1,195.2 (H) 06/13/2021   MICROALBUR 0.50 11/28/2012   MICROALBUR 0.50 07/20/2012     Lipid Panel ( most recent) Lipid Panel     Component Value Date/Time   CHOL 190 07/20/2022 1440   TRIG 500 (H) 07/20/2022 1440   HDL 73 07/20/2022 1440   CHOLHDL 2.6 07/20/2022 1440   CHOLHDL 2.0 11/05/2015 1252   VLDL 12 11/05/2015 1252   LDLCALC 46 07/20/2022 1440   LABVLDL 71 (H) 07/20/2022 1440      Lab Results  Component Value Date   TSH 1.060 10/26/2022   TSH 0.975 10/03/2021   TSH 2.520 05/26/2021   TSH 1.802 05/14/2021   TSH 0.342 (L) 05/27/2018   TSH 0.55 11/05/2015   TSH 1.175 11/28/2012   TSH 0.706  07/20/2012   TSH 1.008 08/26/2011   FREET4 1.19 10/26/2022   FREET4 1.4 11/05/2015   FREET4 1.26 11/28/2012   FREET4 1.44 07/20/2012   FREET4 1.23 08/26/2011      Assessment & Plan:   1) Uncontrolled type 1 diabetes mellitus with ESRD    Lindsey Murillo has currently uncontrolled symptomatic type (?1) DM since 27 years of age.  She presents today with her CGM  showing greatly improved glycemic profile overall.  She was not due for another A1c today, recently was 10.5% on 8/6.  She is former patient of Dr. Fransico Him but her mom sees me and she wanted to try it out.  Analysis of her CGM shows TIR 62%, TAR 36%, TBR 2% with a GMI of 7.3%.  She does have some nocturnal hypoglycemia noted.   - I had a long discussion with her about the progressive nature of diabetes and the pathology behind its complications. -her diabetes is complicated by peripheral neuropathy, ESRD, noncompliance/nonadherence, peripheral ? Lymphedema she remains at a high risk for more acute and chronic complications which include CAD, CVA, CKD, retinopathy, and neuropathy. These are all discussed in detail with her.  - Nutritional counseling repeated at each appointment due to patients tendency to fall back in to old habits.  - The patient admits there is a room for improvement in their diet and drink choices. -  Suggestion is made for the patient to avoid simple carbohydrates from their diet including Cakes, Sweet Desserts / Pastries, Ice Cream, Soda (diet and regular), Sweet Tea, Candies, Chips, Cookies, Sweet Pastries, Store Bought Juices, Alcohol in Excess of 1-2 drinks a day, Artificial Sweeteners, Coffee Creamer, and "Sugar-free" Products. This will help patient to have stable blood glucose profile and potentially avoid unintended weight gain.   - I encouraged the patient to switch to unprocessed or minimally processed complex starch and increased protein intake (animal or plant source), fruits, and vegetables.   - Patient is advised to stick to a routine mealtimes to eat 3 meals a day and avoid unnecessary snacks (to snack only to correct hypoglycemia).  - I have approached her with the following plan to manage  her diabetes and patient agrees:   -Based on her nocturnal hypoglycemia, she is advised to lower her Basaglar (changed to [place] due to ins pref) to 15 units SQ nightly and  continue her Novolog 5-8 units TID with meals if glucose is above 90 and she is eating (Specific instructions on how to titrate insulin dosage based on glucose readings given to patient in writing).   She is advised to monitor glucose 4 times daily (using her CGM) and call the clinic if she has readings less than 70 or above 200 for 3 tests in a row.    -We did talk about possibility of insulin pump in the future.  I encouraged her to research the different ones and we can discuss further on subsequent visits.  - she is warned not to take insulin without proper monitoring per orders. - She does not have a suitable , non insulin option to treat her diabetes.  - Specific targets for  A1c;  LDL, HDL,  and Triglycerides were discussed with the patient.  2) Blood Pressure /Hypertension:  -Her blood pressure is controlled to target without the use of antihypertensives.  Changes will be deferred to nephrology.  3) Lipids/Hyperlipidemia:  Her recent lipid panel from 07/20/22 shows controlled LDL of 46 and significantly elevated triglycerides of 500.  she is advised to continue Lipitor 10 mg p.o. daily at bedtime.  Side effects and precautions discussed with her.  4)  Weight/Diet:  Body mass index is 18.95 kg/m.   She is not a candidate for weight loss.    5) Chronic Care/Health Maintenance: -she is not on ACEI/ARB and is on Statin medications and  is encouraged to initiate and continue to follow up with Ophthalmology, Dentist,  Podiatrist at least yearly or according to recommendations, and advised to  stay away from smoking. I have recommended yearly flu vaccine and pneumonia vaccine at least every 5 years; moderate intensity exercise for up to 150 minutes weekly; and  sleep for 7- 9 hours a day.  - she is  advised to maintain close follow up with [name], DO for primary care needs, as well as her other providers for optimal and coordinated care.     I spent  38  minutes in the care of the  patient today including review of labs from CMP, Lipids, Thyroid Function, Hematology (current and previous including abstractions from other facilities); face-to-face time discussing  her blood glucose readings/logs, discussing hypoglycemia and hyperglycemia episodes and symptoms, medications doses, her options of short and long term treatment based on the latest standards of care / guidelines;  discussion about incorporating lifestyle medicine;  and documenting the encounter. Risk reduction counseling performed per USPSTF guidelines to reduce obesity and cardiovascular risk factors.     Please refer to Patient Instructions for Blood Glucose Monitoring and Insulin/Medications Dosing Guide"  in media tab for additional information. Please  also refer to " Patient Self Inventory" in the Media  tab for reviewed elements of pertinent patient history.  Lindsey Murillo participated in the discussions, expressed understanding, and voiced agreement with the above plans.  All questions were answered to her satisfaction. she is encouraged to contact clinic should she have any questions or concerns prior to her return visit.   Follow up plan: - Return in about 3 months (around 04/28/2023) for Diabetes F/U with A1c in office, No previsit labs, Bring meter and logs.   Ronny Bacon, [facility] [address] [place], [place] [address] Phone: [phone] Fax: [phone]

## 2023-01-28 DIAGNOSIS — D631 Anemia in chronic kidney disease: Secondary | ICD-10-CM | POA: Diagnosis not present

## 2023-01-28 DIAGNOSIS — R52 Pain, unspecified: Secondary | ICD-10-CM | POA: Diagnosis not present

## 2023-01-28 DIAGNOSIS — N2581 Secondary hyperparathyroidism of renal origin: Secondary | ICD-10-CM | POA: Diagnosis not present

## 2023-01-28 DIAGNOSIS — Z992 Dependence on renal dialysis: Secondary | ICD-10-CM | POA: Diagnosis not present

## 2023-01-28 DIAGNOSIS — E1040 Type 1 diabetes mellitus with diabetic neuropathy, unspecified: Secondary | ICD-10-CM | POA: Diagnosis not present

## 2023-01-28 DIAGNOSIS — N186 End stage renal disease: Secondary | ICD-10-CM | POA: Diagnosis not present

## 2023-01-31 DIAGNOSIS — N2581 Secondary hyperparathyroidism of renal origin: Secondary | ICD-10-CM | POA: Diagnosis not present

## 2023-01-31 DIAGNOSIS — E1040 Type 1 diabetes mellitus with diabetic neuropathy, unspecified: Secondary | ICD-10-CM | POA: Diagnosis not present

## 2023-01-31 DIAGNOSIS — R52 Pain, unspecified: Secondary | ICD-10-CM | POA: Diagnosis not present

## 2023-01-31 DIAGNOSIS — Z992 Dependence on renal dialysis: Secondary | ICD-10-CM | POA: Diagnosis not present

## 2023-01-31 DIAGNOSIS — D631 Anemia in chronic kidney disease: Secondary | ICD-10-CM | POA: Diagnosis not present

## 2023-01-31 DIAGNOSIS — N186 End stage renal disease: Secondary | ICD-10-CM | POA: Diagnosis not present

## 2023-02-01 ENCOUNTER — Telehealth: Payer: Self-pay | Admitting: Nurse Practitioner

## 2023-02-01 NOTE — Telephone Encounter (Signed)
Called and let pt know Whitneys recommendations, pt understood.

## 2023-02-01 NOTE — Telephone Encounter (Signed)
Have her lower her Basaglar (night time insulin) to 10 units and skip her mealtime insulin for today until her glucose gets above 100 and stays there.  Encourage her to focus on proteins to help bring her glucose up and keep it there.

## 2023-02-01 NOTE — Telephone Encounter (Signed)
Pt called with high BG readings.   Date Before breakfast Before lunch Before supper Bedtime  01/31/23    39  02/01/23 69 71                  Pt taking: Pt states Whitney wanted a call if she had 3 low blood sugars in a row, has not taken anything.  She is eating but it won't get above 80.

## 2023-02-02 ENCOUNTER — Encounter (HOSPITAL_COMMUNITY): Payer: Self-pay | Admitting: Emergency Medicine

## 2023-02-02 ENCOUNTER — Emergency Department (HOSPITAL_COMMUNITY)
Admission: EM | Admit: 2023-02-02 | Discharge: 2023-02-02 | Disposition: A | Discharge status: Home / Self Care | Payer: 59 | Attending: Emergency Medicine | Admitting: Emergency Medicine

## 2023-02-02 ENCOUNTER — Other Ambulatory Visit: Payer: Self-pay

## 2023-02-02 DIAGNOSIS — R112 Nausea with vomiting, unspecified: Secondary | ICD-10-CM | POA: Insufficient documentation

## 2023-02-02 DIAGNOSIS — Z794 Long term (current) use of insulin: Secondary | ICD-10-CM | POA: Insufficient documentation

## 2023-02-02 DIAGNOSIS — R000 Tachycardia, unspecified: Secondary | ICD-10-CM | POA: Diagnosis not present

## 2023-02-02 DIAGNOSIS — N186 End stage renal disease: Secondary | ICD-10-CM | POA: Diagnosis not present

## 2023-02-02 DIAGNOSIS — Z20822 Contact with and (suspected) exposure to covid-19: Secondary | ICD-10-CM | POA: Diagnosis not present

## 2023-02-02 DIAGNOSIS — E1040 Type 1 diabetes mellitus with diabetic neuropathy, unspecified: Secondary | ICD-10-CM | POA: Diagnosis not present

## 2023-02-02 DIAGNOSIS — E1022 Type 1 diabetes mellitus with diabetic chronic kidney disease: Secondary | ICD-10-CM | POA: Diagnosis not present

## 2023-02-02 DIAGNOSIS — J45909 Unspecified asthma, uncomplicated: Secondary | ICD-10-CM | POA: Insufficient documentation

## 2023-02-02 DIAGNOSIS — Z992 Dependence on renal dialysis: Secondary | ICD-10-CM | POA: Diagnosis not present

## 2023-02-02 DIAGNOSIS — I120 Hypertensive chronic kidney disease with stage 5 chronic kidney disease or end stage renal disease: Secondary | ICD-10-CM | POA: Insufficient documentation

## 2023-02-02 DIAGNOSIS — R197 Diarrhea, unspecified: Secondary | ICD-10-CM | POA: Diagnosis not present

## 2023-02-02 DIAGNOSIS — R109 Unspecified abdominal pain: Secondary | ICD-10-CM | POA: Diagnosis not present

## 2023-02-02 LAB — COMPREHENSIVE METABOLIC PANEL
ALT: 38 U/L (ref 0–44)
AST: 26 U/L (ref 15–41)
Albumin: 3.8 g/dL (ref 3.5–5.0)
Alkaline Phosphatase: 266 U/L — ABNORMAL HIGH (ref 38–126)
Anion gap: 13 (ref 5–15)
BUN: 26 mg/dL — ABNORMAL HIGH (ref 6–20)
CO2: 28 mmol/L (ref 22–32)
Calcium: 9.2 mg/dL (ref 8.9–10.3)
Chloride: 93 mmol/L — ABNORMAL LOW (ref 98–111)
Creatinine, Ser: 5.96 mg/dL — ABNORMAL HIGH (ref 0.44–1.00)
GFR, Estimated: 9 mL/min — ABNORMAL LOW (ref >60)
Glucose, Bld: 107 mg/dL — ABNORMAL HIGH (ref 70–99)
Potassium: 4.3 mmol/L (ref 3.5–5.1)
Sodium: 134 mmol/L — ABNORMAL LOW (ref 135–145)
Total Bilirubin: 0.8 mg/dL (ref 0.3–1.2)
Total Protein: 8 g/dL (ref 6.5–8.1)

## 2023-02-02 LAB — CBC
HCT: 41.2 % (ref 36.0–46.0)
Hemoglobin: 13.1 g/dL (ref 12.0–15.0)
MCH: 28.9 pg (ref 26.0–34.0)
MCHC: 31.8 g/dL (ref 30.0–36.0)
MCV: 90.9 fL (ref 80.0–100.0)
Platelets: 218 10*3/uL (ref 150–400)
RBC: 4.53 MIL/uL (ref 3.87–5.11)
RDW: 18.4 % — ABNORMAL HIGH (ref 11.5–15.5)
WBC: 8.2 10*3/uL (ref 4.0–10.5)
nRBC: 0 % (ref 0.0–0.2)

## 2023-02-02 LAB — LIPASE, BLOOD: Lipase: 33 U/L (ref 11–51)

## 2023-02-02 LAB — HCG, QUANTITATIVE, PREGNANCY: hCG, Beta Chain, Quant, S: 3 m[IU]/mL (ref <5)

## 2023-02-02 LAB — SARS CORONAVIRUS 2 BY RT PCR: SARS Coronavirus 2 by RT PCR: NEGATIVE

## 2023-02-02 MED ORDER — SODIUM CHLORIDE 0.9 % IV BOLUS
500.0000 mL | Freq: Once | INTRAVENOUS | Status: AC
  Administered 2023-02-02: 500 mL via INTRAVENOUS

## 2023-02-02 MED ORDER — SCOPOLAMINE 1 MG/3DAYS TD PT72: 1.0000 | MEDICATED_PATCH | TRANSDERMAL | 12 refills | Status: DC

## 2023-02-02 MED ORDER — SCOPOLAMINE 1 MG/3DAYS TD PT72
1.0000 | MEDICATED_PATCH | TRANSDERMAL | Status: DC
  Administered 2023-02-02: 1.5 mg via TRANSDERMAL
  Filled 2023-02-02: qty 1

## 2023-02-02 NOTE — Discharge Instructions (Addendum)
You were seen in the ER for vomiting.  As we discussed, your blood work overall looked reassuring. We tested you for COVID and these results should be back later today. You can follow up the results on MyChart.  I have sent a prescription for the same nausea patches we gave you here. You can place one for up to 3 days as needed.   Continue to monitor how you're doing and return to the ER for new or worsening symptoms.

## 2023-02-02 NOTE — ED Provider Notes (Signed)
[place] EMERGENCY DEPARTMENT AT [facility] Provider Note   CSN: [id number] Arrival date & time: 02/02/23  1054     History  Chief Complaint  Patient presents with   Emesis    Lindsey Murillo is a 27 y.o. female with history of type 1 diabetes, goiter, neuropathy, asthma, ESRD on hemodialysis MWF, hypertension, who presents the emergency department complaining of nausea, vomiting, diarrhea.  Symptoms started last night.  Patient's had about 4 episodes of nonbloody nonbilious emesis.  1 episode of diarrhea last night.  She tried to go to dialysis and they recommended that she come here for evaluation.  She has not missed any recent appointments.  Denies any fever.  She is having some left sided abdominal pain.   Emesis Associated symptoms: abdominal pain and diarrhea        Home Medications Prior to Admission medications   Medication Sig Start Date End Date Taking? Authorizing Provider  scopolamine (TRANSDERM-SCOP) 1 MG/3DAYS Place 1 patch (1.5 mg total) onto the skin every 3 (three) days. 02/02/23  Yes Malva Diesing T, PA-C  acetaminophen (TYLENOL) 325 MG tablet Take by mouth. 12/24/21   [provider]  albuterol (VENTOLIN HFA) 108 (90 Base) MCG/ACT inhaler Inhale into the lungs. 12/23/22   Hetty Blend, FNP  Blood Glucose Monitoring Suppl (ACCU-CHEK GUIDE ME) w/Device KIT 1 Piece by Does not apply route as directed. 01/21/22   Roma Kayser, MD  cetirizine HCl (ZYRTEC) 1 MG/ML solution Take 1.25 ml twice a day as needed for a runny nose or itch 12/23/22   Ambs, Norvel Richards, FNP  Continuous Glucose Receiver (DEXCOM G7 RECEIVER) DEVI by Does not apply route.    [provider]  Continuous Glucose Sensor (DEXCOM G7 SENSOR) MISC by Does not apply route.    [provider]  fluticasone (FLONASE) 50 MCG/ACT nasal spray Place 2 sprays into both nostrils daily as needed for allergies or rhinitis. 12/23/22   Hetty Blend, FNP  Fluticasone  Furoate (ARNUITY ELLIPTA) 100 MCG/ACT AEPB Inhale 1 Dose into the lungs daily. 12/25/22   Hetty Blend, FNP  glucose blood (ACCU-CHEK GUIDE) test strip Use to monitor glucose 4 times a day as instructed 01/21/22   Roma Kayser, MD  insulin aspart (FIASP FLEXTOUCH) 100 UNIT/ML FlexTouch Pen Inject 5-8 Units into the skin 3 (three) times daily before meals. 01/27/23   Dani Gobble, NP  insulin degludec (TRESIBA FLEXTOUCH) 100 UNIT/ML FlexTouch Pen Inject 15 Units into the skin at bedtime. 01/27/23   Dani Gobble, NP  Insulin Pen Needle 31G X 4 MM MISC Use as directed to administer insulin 4 times daily. 01/27/23   Dani Gobble, NP  medroxyPROGESTERone (PROVERA) 10 MG tablet Take provera 10 mg for 10 days every 3 months 11/16/22   Adline Potter, NP  Methoxy PEG-Epoetin Beta (MIRCERA IJ)  11/01/22 10/31/23  [provider]  ondansetron (ZOFRAN-ODT) 4 MG disintegrating tablet 4mg  ODT q4 hours prn nausea/vomit 12/01/22   Linwood Dibbles, MD  oxyCODONE (OXY IR/ROXICODONE) 5 MG immediate release tablet  10/22/21   [provider]  pantoprazole (PROTONIX) 40 MG tablet Take 1 tablet (40 mg total) by mouth 2 (two) times daily before a meal. 09/16/22 09/11/23  Aida Raider, NP  rosuvastatin (CRESTOR) 10 MG tablet Take 1 tablet (10 mg total) by mouth daily. 07/20/22   [name], DO  sucroferric oxyhydroxide (VELPHORO) 500 MG chewable tablet Chew 500 mg by mouth  3 (three) times daily with meals. 11/16/21   [provider]  Vitamin D, Ergocalciferol, (DRISDOL) 1.25 MG (50000 UNIT) CAPS capsule Take 50,000 Units by mouth every 7 (seven) days.    [provider]      Allergies    Patient has no known allergies.    Review of Systems   Review of Systems  Gastrointestinal:  Positive for abdominal pain, diarrhea, nausea and vomiting.  All other systems reviewed and are negative.   Physical Exam Updated Vital Signs BP (!) 131/95   Pulse (!) 105   Temp  98.1 F (36.7 C) (Oral)   Resp 14   Ht 5\' 8"  (1.727 m)   Wt 56 kg   LMP 01/19/2023   SpO2 98%   BMI 18.77 kg/m  Physical Exam Vitals and nursing note reviewed.  Constitutional:      Appearance: Normal appearance.  HENT:     Head: Normocephalic and atraumatic.  Eyes:     Conjunctiva/sclera: Conjunctivae normal.  Cardiovascular:     Rate and Rhythm: Normal rate and regular rhythm.  Pulmonary:     Effort: Pulmonary effort is normal. No respiratory distress.     Breath sounds: Normal breath sounds.  Abdominal:     General: There is no distension.     Palpations: Abdomen is soft.     Tenderness: There is abdominal tenderness. There is no right CVA tenderness or left CVA tenderness.     Comments: Generalized left sided abdominal pain without guarding or rebound  Skin:    General: Skin is warm and dry.  Neurological:     General: No focal deficit present.     Mental Status: She is alert.     ED Results / Procedures / Treatments   Labs (all labs ordered are listed, but only abnormal results are displayed) Labs Reviewed  COMPREHENSIVE METABOLIC PANEL - Abnormal; Notable for the following components:      Result Value   Sodium 134 (*)    Chloride 93 (*)    Glucose, Bld 107 (*)    BUN 26 (*)    Creatinine, Ser 5.96 (*)    Alkaline Phosphatase 266 (*)    GFR, Estimated 9 (*)    All other components within normal limits  CBC - Abnormal; Notable for the following components:   RDW 18.4 (*)    All other components within normal limits  SARS CORONAVIRUS 2 BY RT PCR  LIPASE, BLOOD  HCG, QUANTITATIVE, PREGNANCY    EKG EKG Interpretation Date/Time:  Wednesday February 02 2023 15:31:02 EDT Ventricular Rate:  108 PR Interval:  172 QRS Duration:  109 QT Interval:  353 QTC Calculation: 474 R Axis:   -54  Text Interpretation: Sinus tachycardia Confirmed by Cathren Laine ([id number]) on 02/02/2023 3:36:04 PM  Radiology No results found.  Procedures Procedures     Medications Ordered in ED Medications  scopolamine (TRANSDERM-SCOP) 1 MG/3DAYS 1.5 mg (1.5 mg Transdermal Patch Applied 02/02/23 1458)  sodium chloride 0.9 % bolus 500 mL (0 mLs Intravenous Stopped 02/02/23 1641)    ED Course/ Medical Decision Making/ A&P                                 Medical Decision Making Amount and/or Complexity of Data Reviewed Labs: ordered.  Risk Prescription drug management.   This patient is a 27 y.o. female  who presents to the ED for concern of abdominal  pain, nausea, vomiting, diarrhea since last night.   Differential diagnoses prior to evaluation: The emergent differential diagnosis includes, but is not limited to,  ACS/MI,  DKA, Ischemic bowel, Sepsis, Drug-related (toxicity, THC hyperemesis, ETOH, withdrawal), Appendicitis, Bowel obstruction, Electrolyte abnormalities, Pancreatitis, Biliary colic, Gastroenteritis, Gastroparesis, Hepatitis, Migraine, Thyroid disease, Renal colic, GERD/PUD, UTI, Ovarian torsion, Pregnancy, Hyperemesis gravidarum. This is not an exhaustive differential.   Past Medical History / Co-morbidities / Social History: type 1 diabetes, goiter, neuropathy, asthma, ESRD on hemodialysis MWF, hypertension, chronic pancreatitis, secondary hyperparathyroidism  Additional history: Chart reviewed. Pertinent results include: Reviewed most recent endocrinology visit  Physical Exam: Physical exam performed. The pertinent findings include: Hypertensive, otherwise normal vital signs.  No acute distress.  Afebrile.  Left-sided abdominal tenderness to palpation, without guarding or rebound tenderness.  Lab Tests/Imaging studies: I personally interpreted labs/imaging and the pertinent results include: No leukocytosis.  CMP grossly at baseline.  Normal lipase.  No evidence of DKA.  Negative pregnancy.  Negative COVID.  As patient only urinates about once a day and she is not complaining of any urinary symptoms, will defer urinalysis.  As  patient has a nonsurgical abdominal exam and reassuring laboratory evaluation, will defer emergent abdominal imaging..   Cardiac monitoring: EKG obtained and interpreted by myself and attending physician which shows: Sinus tachycardia, QTc 474   Medications: I ordered medication including IVF, scopolamine patch.  This was originally ordered as patient had history of prolonged QT and was given prior to obtaining EKG..  I have reviewed the patients home medicines and have made adjustments as needed.   Disposition: After consideration of the diagnostic results and the patients response to treatment, I feel that emergency department workup does not suggest an emergent condition requiring admission or immediate intervention beyond what has been performed at this time. The plan is: Discharged home with symptomatic management of vomiting and diarrhea.  Likely viral in origin.  Labs at baseline, not in DKA.  Patient tolerated p.o. challenge after scopolamine patch, she is requesting prescription of this for home.  Recommended following up with dialysis as scheduled.. The patient is safe for discharge and has been instructed to return immediately for worsening symptoms, change in symptoms or any other concerns.  Final Clinical Impression(s) / ED Diagnoses Final diagnoses:  Nausea vomiting and diarrhea    Rx / DC Orders ED Discharge Orders          Ordered    scopolamine (TRANSDERM-SCOP) 1 MG/3DAYS  every 72 hours        02/02/23 1558           Portions of this report may have been transcribed using voice recognition software. Every effort was made to ensure accuracy; however, inadvertent computerized transcription errors may be present.    Su Monks, PA-C 02/02/23 1746    Sloan Leiter, DO 02/03/23 [phone]

## 2023-02-02 NOTE — ED Triage Notes (Signed)
PT complains of vomiting that started last night. Pt was supposed to have dialysis today but was told  to come to ER and will have dialysis on Friday. Pt states sugar has been low right now it is 113

## 2023-02-04 DIAGNOSIS — D631 Anemia in chronic kidney disease: Secondary | ICD-10-CM | POA: Diagnosis not present

## 2023-02-04 DIAGNOSIS — Z992 Dependence on renal dialysis: Secondary | ICD-10-CM | POA: Diagnosis not present

## 2023-02-04 DIAGNOSIS — N186 End stage renal disease: Secondary | ICD-10-CM | POA: Diagnosis not present

## 2023-02-04 DIAGNOSIS — N2581 Secondary hyperparathyroidism of renal origin: Secondary | ICD-10-CM | POA: Diagnosis not present

## 2023-02-04 DIAGNOSIS — E1040 Type 1 diabetes mellitus with diabetic neuropathy, unspecified: Secondary | ICD-10-CM | POA: Diagnosis not present

## 2023-02-04 DIAGNOSIS — R52 Pain, unspecified: Secondary | ICD-10-CM | POA: Diagnosis not present

## 2023-02-07 DIAGNOSIS — N2581 Secondary hyperparathyroidism of renal origin: Secondary | ICD-10-CM | POA: Diagnosis not present

## 2023-02-07 DIAGNOSIS — E1040 Type 1 diabetes mellitus with diabetic neuropathy, unspecified: Secondary | ICD-10-CM | POA: Diagnosis not present

## 2023-02-07 DIAGNOSIS — Z992 Dependence on renal dialysis: Secondary | ICD-10-CM | POA: Diagnosis not present

## 2023-02-07 DIAGNOSIS — N032 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: Secondary | ICD-10-CM | POA: Diagnosis not present

## 2023-02-07 DIAGNOSIS — R52 Pain, unspecified: Secondary | ICD-10-CM | POA: Diagnosis not present

## 2023-02-07 DIAGNOSIS — N186 End stage renal disease: Secondary | ICD-10-CM | POA: Diagnosis not present

## 2023-02-07 DIAGNOSIS — D631 Anemia in chronic kidney disease: Secondary | ICD-10-CM | POA: Diagnosis not present

## 2023-02-09 DIAGNOSIS — E1040 Type 1 diabetes mellitus with diabetic neuropathy, unspecified: Secondary | ICD-10-CM | POA: Diagnosis not present

## 2023-02-09 DIAGNOSIS — E1022 Type 1 diabetes mellitus with diabetic chronic kidney disease: Secondary | ICD-10-CM | POA: Diagnosis not present

## 2023-02-09 DIAGNOSIS — D631 Anemia in chronic kidney disease: Secondary | ICD-10-CM | POA: Diagnosis not present

## 2023-02-09 DIAGNOSIS — N2581 Secondary hyperparathyroidism of renal origin: Secondary | ICD-10-CM | POA: Diagnosis not present

## 2023-02-09 DIAGNOSIS — E1043 Type 1 diabetes mellitus with diabetic autonomic (poly)neuropathy: Secondary | ICD-10-CM | POA: Diagnosis not present

## 2023-02-09 DIAGNOSIS — N186 End stage renal disease: Secondary | ICD-10-CM | POA: Diagnosis not present

## 2023-02-09 DIAGNOSIS — Z992 Dependence on renal dialysis: Secondary | ICD-10-CM | POA: Diagnosis not present

## 2023-02-11 DIAGNOSIS — N186 End stage renal disease: Secondary | ICD-10-CM | POA: Diagnosis not present

## 2023-02-11 DIAGNOSIS — E1022 Type 1 diabetes mellitus with diabetic chronic kidney disease: Secondary | ICD-10-CM | POA: Diagnosis not present

## 2023-02-11 DIAGNOSIS — N2581 Secondary hyperparathyroidism of renal origin: Secondary | ICD-10-CM | POA: Diagnosis not present

## 2023-02-11 DIAGNOSIS — Z992 Dependence on renal dialysis: Secondary | ICD-10-CM | POA: Diagnosis not present

## 2023-02-11 DIAGNOSIS — E1043 Type 1 diabetes mellitus with diabetic autonomic (poly)neuropathy: Secondary | ICD-10-CM | POA: Diagnosis not present

## 2023-02-11 DIAGNOSIS — E1040 Type 1 diabetes mellitus with diabetic neuropathy, unspecified: Secondary | ICD-10-CM | POA: Diagnosis not present

## 2023-02-11 DIAGNOSIS — D631 Anemia in chronic kidney disease: Secondary | ICD-10-CM | POA: Diagnosis not present

## 2023-02-14 DIAGNOSIS — E119 Type 2 diabetes mellitus without complications: Secondary | ICD-10-CM | POA: Diagnosis not present

## 2023-02-14 DIAGNOSIS — Z87891 Personal history of nicotine dependence: Secondary | ICD-10-CM | POA: Diagnosis not present

## 2023-02-14 DIAGNOSIS — K529 Noninfective gastroenteritis and colitis, unspecified: Secondary | ICD-10-CM | POA: Diagnosis not present

## 2023-02-14 DIAGNOSIS — R112 Nausea with vomiting, unspecified: Secondary | ICD-10-CM | POA: Diagnosis not present

## 2023-02-14 DIAGNOSIS — Z794 Long term (current) use of insulin: Secondary | ICD-10-CM | POA: Diagnosis not present

## 2023-02-15 ENCOUNTER — Ambulatory Visit: Payer: 59 | Admitting: Adult Health

## 2023-02-15 DIAGNOSIS — Z992 Dependence on renal dialysis: Secondary | ICD-10-CM | POA: Diagnosis not present

## 2023-02-15 DIAGNOSIS — E1022 Type 1 diabetes mellitus with diabetic chronic kidney disease: Secondary | ICD-10-CM | POA: Diagnosis not present

## 2023-02-15 DIAGNOSIS — N186 End stage renal disease: Secondary | ICD-10-CM | POA: Diagnosis not present

## 2023-02-15 DIAGNOSIS — E1040 Type 1 diabetes mellitus with diabetic neuropathy, unspecified: Secondary | ICD-10-CM | POA: Diagnosis not present

## 2023-02-15 DIAGNOSIS — D631 Anemia in chronic kidney disease: Secondary | ICD-10-CM | POA: Diagnosis not present

## 2023-02-15 DIAGNOSIS — E1043 Type 1 diabetes mellitus with diabetic autonomic (poly)neuropathy: Secondary | ICD-10-CM | POA: Diagnosis not present

## 2023-02-15 DIAGNOSIS — N2581 Secondary hyperparathyroidism of renal origin: Secondary | ICD-10-CM | POA: Diagnosis not present

## 2023-02-16 DIAGNOSIS — E1043 Type 1 diabetes mellitus with diabetic autonomic (poly)neuropathy: Secondary | ICD-10-CM | POA: Diagnosis not present

## 2023-02-16 DIAGNOSIS — Z992 Dependence on renal dialysis: Secondary | ICD-10-CM | POA: Diagnosis not present

## 2023-02-16 DIAGNOSIS — N2581 Secondary hyperparathyroidism of renal origin: Secondary | ICD-10-CM | POA: Diagnosis not present

## 2023-02-16 DIAGNOSIS — E1040 Type 1 diabetes mellitus with diabetic neuropathy, unspecified: Secondary | ICD-10-CM | POA: Diagnosis not present

## 2023-02-16 DIAGNOSIS — E1022 Type 1 diabetes mellitus with diabetic chronic kidney disease: Secondary | ICD-10-CM | POA: Diagnosis not present

## 2023-02-16 DIAGNOSIS — N186 End stage renal disease: Secondary | ICD-10-CM | POA: Diagnosis not present

## 2023-02-16 DIAGNOSIS — D631 Anemia in chronic kidney disease: Secondary | ICD-10-CM | POA: Diagnosis not present

## 2023-02-18 ENCOUNTER — Encounter (HOSPITAL_COMMUNITY): Payer: Self-pay | Admitting: Hematology

## 2023-02-18 DIAGNOSIS — E1022 Type 1 diabetes mellitus with diabetic chronic kidney disease: Secondary | ICD-10-CM | POA: Diagnosis not present

## 2023-02-18 DIAGNOSIS — N186 End stage renal disease: Secondary | ICD-10-CM | POA: Diagnosis not present

## 2023-02-18 DIAGNOSIS — Z992 Dependence on renal dialysis: Secondary | ICD-10-CM | POA: Diagnosis not present

## 2023-02-18 DIAGNOSIS — E1043 Type 1 diabetes mellitus with diabetic autonomic (poly)neuropathy: Secondary | ICD-10-CM | POA: Diagnosis not present

## 2023-02-18 DIAGNOSIS — E1040 Type 1 diabetes mellitus with diabetic neuropathy, unspecified: Secondary | ICD-10-CM | POA: Diagnosis not present

## 2023-02-18 DIAGNOSIS — N2581 Secondary hyperparathyroidism of renal origin: Secondary | ICD-10-CM | POA: Diagnosis not present

## 2023-02-18 DIAGNOSIS — D631 Anemia in chronic kidney disease: Secondary | ICD-10-CM | POA: Diagnosis not present

## 2023-02-21 ENCOUNTER — Ambulatory Visit (INDEPENDENT_AMBULATORY_CARE_PROVIDER_SITE_OTHER): Payer: 59 | Admitting: Adult Health

## 2023-02-21 ENCOUNTER — Encounter: Payer: Self-pay | Admitting: Adult Health

## 2023-02-21 VITALS — BP 122/82 | HR 115 | Ht 67.0 in | Wt 120.5 lb

## 2023-02-21 DIAGNOSIS — E1043 Type 1 diabetes mellitus with diabetic autonomic (poly)neuropathy: Secondary | ICD-10-CM | POA: Diagnosis not present

## 2023-02-21 DIAGNOSIS — N911 Secondary amenorrhea: Secondary | ICD-10-CM | POA: Diagnosis not present

## 2023-02-21 DIAGNOSIS — N186 End stage renal disease: Secondary | ICD-10-CM | POA: Diagnosis not present

## 2023-02-21 DIAGNOSIS — E1040 Type 1 diabetes mellitus with diabetic neuropathy, unspecified: Secondary | ICD-10-CM | POA: Diagnosis not present

## 2023-02-21 DIAGNOSIS — E1022 Type 1 diabetes mellitus with diabetic chronic kidney disease: Secondary | ICD-10-CM | POA: Diagnosis not present

## 2023-02-21 DIAGNOSIS — E1065 Type 1 diabetes mellitus with hyperglycemia: Secondary | ICD-10-CM | POA: Diagnosis not present

## 2023-02-21 DIAGNOSIS — N049 Nephrotic syndrome with unspecified morphologic changes: Secondary | ICD-10-CM | POA: Diagnosis not present

## 2023-02-21 DIAGNOSIS — D631 Anemia in chronic kidney disease: Secondary | ICD-10-CM | POA: Diagnosis not present

## 2023-02-21 DIAGNOSIS — N2581 Secondary hyperparathyroidism of renal origin: Secondary | ICD-10-CM | POA: Diagnosis not present

## 2023-02-21 DIAGNOSIS — Z992 Dependence on renal dialysis: Secondary | ICD-10-CM | POA: Diagnosis not present

## 2023-02-21 NOTE — Progress Notes (Signed)
Subjective:     Patient ID: Lindsey Murillo, female   DOB: 1995-05-13, 27 y.o.   MRN: [id number]  HPI Carigan is a 27 year old black female,single, G0P0, back in follow up on taking provera 10 mg for 10 days every 3 months to get a withdrawal bleed and it is working. She had dialysis today.     Component Value Date/Time   DIAGPAP  10/26/2022 1021    - Negative for intraepithelial lesion or malignancy (NILM)   HPVHIGH Negative 10/26/2022 1021   ADEQPAP  10/26/2022 1021    Satisfactory for evaluation; transformation zone component ABSENT.    PCP is Dr Adriana Simas.  Review of Systems Has period every 3 months after taking provera   Not having sex Reviewed past medical,surgical, social and family history. Reviewed medications and allergies.  Objective:   Physical Exam BP 122/82 (BP Location: Left Arm, Patient Position: Sitting, Cuff Size: Normal)   Pulse (!) 115   Ht 5\' 7"  (1.702 m)   Wt 120 lb 8 oz (54.7 kg)   LMP 01/19/2023   BMI 18.87 kg/m     Skin warm and dry. Lungs: clear to ausculation bilaterally. Cardiovascular: regular rate and rhythm.   Upstream - 02/21/23 1622       Pregnancy Intention Screening   Does the patient want to become pregnant in the next year? No    Does the patient's partner want to become pregnant in the next year? No    Would the patient like to discuss contraceptive options today? No      Contraception Wrap Up   Current Method Abstinence    End Method Abstinence             Assessment:      1. Amenorrhea, secondary Has bleeding after taking provera 10 mg for 10 days every month Has refills  2. Nephrotic syndrome On dialysis   3. Uncontrolled type 1 diabetes mellitus with hyperglycemia ([name])      Plan:     Follow up in 6 months or sooner if needed

## 2023-02-23 DIAGNOSIS — N2581 Secondary hyperparathyroidism of renal origin: Secondary | ICD-10-CM | POA: Diagnosis not present

## 2023-02-23 DIAGNOSIS — Z992 Dependence on renal dialysis: Secondary | ICD-10-CM | POA: Diagnosis not present

## 2023-02-23 DIAGNOSIS — D631 Anemia in chronic kidney disease: Secondary | ICD-10-CM | POA: Diagnosis not present

## 2023-02-23 DIAGNOSIS — N186 End stage renal disease: Secondary | ICD-10-CM | POA: Diagnosis not present

## 2023-02-23 DIAGNOSIS — E1022 Type 1 diabetes mellitus with diabetic chronic kidney disease: Secondary | ICD-10-CM | POA: Diagnosis not present

## 2023-02-23 DIAGNOSIS — E1040 Type 1 diabetes mellitus with diabetic neuropathy, unspecified: Secondary | ICD-10-CM | POA: Diagnosis not present

## 2023-02-23 DIAGNOSIS — E1043 Type 1 diabetes mellitus with diabetic autonomic (poly)neuropathy: Secondary | ICD-10-CM | POA: Diagnosis not present

## 2023-02-25 DIAGNOSIS — N186 End stage renal disease: Secondary | ICD-10-CM | POA: Diagnosis not present

## 2023-02-25 DIAGNOSIS — D631 Anemia in chronic kidney disease: Secondary | ICD-10-CM | POA: Diagnosis not present

## 2023-02-25 DIAGNOSIS — Z992 Dependence on renal dialysis: Secondary | ICD-10-CM | POA: Diagnosis not present

## 2023-02-25 DIAGNOSIS — N2581 Secondary hyperparathyroidism of renal origin: Secondary | ICD-10-CM | POA: Diagnosis not present

## 2023-02-25 DIAGNOSIS — E1043 Type 1 diabetes mellitus with diabetic autonomic (poly)neuropathy: Secondary | ICD-10-CM | POA: Diagnosis not present

## 2023-02-25 DIAGNOSIS — E1040 Type 1 diabetes mellitus with diabetic neuropathy, unspecified: Secondary | ICD-10-CM | POA: Diagnosis not present

## 2023-02-25 DIAGNOSIS — E1022 Type 1 diabetes mellitus with diabetic chronic kidney disease: Secondary | ICD-10-CM | POA: Diagnosis not present

## 2023-02-25 IMAGING — US IR FLUORO GUIDE CV LINE*R*
1 series · 1 of 1 positions shown · non-contrast
Comparison: None Available.

INDICATION: 26-year-old female with renal failure requiring central venous
access for hemodialysis.

EXAM:
NON-TUNNELED CENTRAL VENOUS HEMODIALYSIS CATHETER PLACEMENT WITH
ULTRASOUND AND FLUOROSCOPIC GUIDANCE

[Series 1: ir fluoro guide cv line*right* · 1 of 1 slices shown]
[im 1/1]
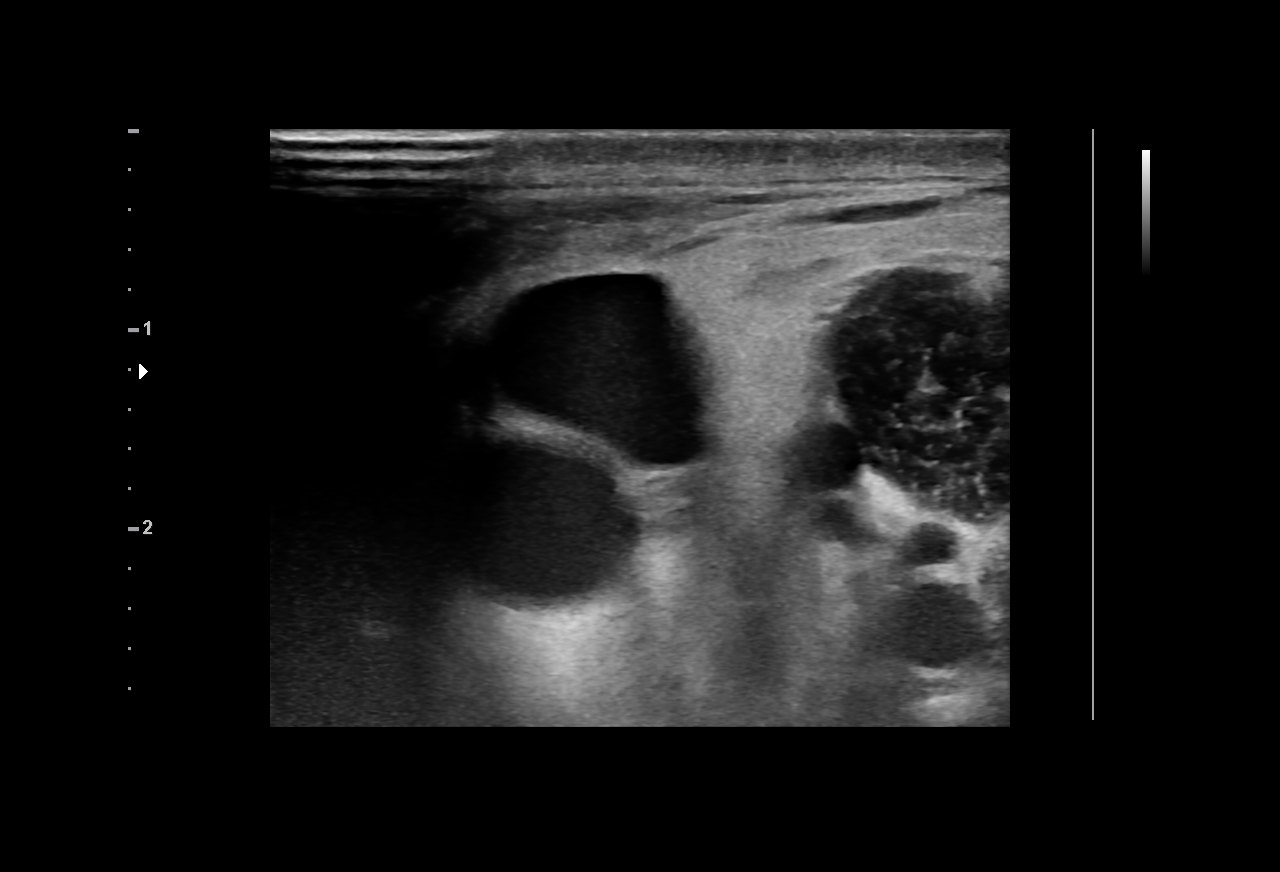

[1 of 1 positions shown; findings below may reference images not displayed]

MEDICATIONS:
None

FLUOROSCOPY TIME:  One mGy

COMPLICATIONS:
None immediate.



After the overlying soft tissues were anesthetized, a small venotomy
incision was created and a micropuncture kit was utilized to access
the internal jugular vein. Real-time ultrasound guidance was
utilized for vascular access including the acquisition of a
permanent ultrasound image documenting patency of the accessed
vessel. The microwire was utilized to measure appropriate catheter
length.

A Rosen wire was advanced to the level of the IVC. Under
fluoroscopic guidance, the venotomy was serially dilated, ultimately
allowing placement of a 15 cm temporary Trialysis catheter with tip
ultimately terminating within the superior aspect of the right
atrium. Final catheter positioning was confirmed and documented with
a spot radiographic image. The catheter aspirates and flushes
normally. The catheter was flushed with appropriate volume heparin
dwells.

The catheter exit site was secured with a 0-silk retention suture. A
dressing was placed. The patient tolerated the procedure well
without immediate post procedural complication.
IMPRESSION: Successful placement of a right internal jugular approach 15 cm
temporary dialysis catheter with tip terminating with in the
superior aspect of the right atrium. The catheter is ready for
immediate use.

PLAN:
This catheter may be converted to a tunneled dialysis catheter at a
later date as indicated.

## 2023-03-01 DIAGNOSIS — N2581 Secondary hyperparathyroidism of renal origin: Secondary | ICD-10-CM | POA: Diagnosis not present

## 2023-03-01 DIAGNOSIS — D631 Anemia in chronic kidney disease: Secondary | ICD-10-CM | POA: Diagnosis not present

## 2023-03-01 DIAGNOSIS — E1043 Type 1 diabetes mellitus with diabetic autonomic (poly)neuropathy: Secondary | ICD-10-CM | POA: Diagnosis not present

## 2023-03-01 DIAGNOSIS — N186 End stage renal disease: Secondary | ICD-10-CM | POA: Diagnosis not present

## 2023-03-01 DIAGNOSIS — Z992 Dependence on renal dialysis: Secondary | ICD-10-CM | POA: Diagnosis not present

## 2023-03-01 DIAGNOSIS — E1040 Type 1 diabetes mellitus with diabetic neuropathy, unspecified: Secondary | ICD-10-CM | POA: Diagnosis not present

## 2023-03-01 DIAGNOSIS — E1022 Type 1 diabetes mellitus with diabetic chronic kidney disease: Secondary | ICD-10-CM | POA: Diagnosis not present

## 2023-03-02 DIAGNOSIS — E1043 Type 1 diabetes mellitus with diabetic autonomic (poly)neuropathy: Secondary | ICD-10-CM | POA: Diagnosis not present

## 2023-03-02 DIAGNOSIS — E1040 Type 1 diabetes mellitus with diabetic neuropathy, unspecified: Secondary | ICD-10-CM | POA: Diagnosis not present

## 2023-03-02 DIAGNOSIS — D631 Anemia in chronic kidney disease: Secondary | ICD-10-CM | POA: Diagnosis not present

## 2023-03-02 DIAGNOSIS — E1022 Type 1 diabetes mellitus with diabetic chronic kidney disease: Secondary | ICD-10-CM | POA: Diagnosis not present

## 2023-03-02 DIAGNOSIS — N2581 Secondary hyperparathyroidism of renal origin: Secondary | ICD-10-CM | POA: Diagnosis not present

## 2023-03-02 DIAGNOSIS — N186 End stage renal disease: Secondary | ICD-10-CM | POA: Diagnosis not present

## 2023-03-02 DIAGNOSIS — Z992 Dependence on renal dialysis: Secondary | ICD-10-CM | POA: Diagnosis not present

## 2023-03-02 IMAGING — DX DG CHEST 2V
2 series · 2 of 2 positions shown · non-contrast
Comparison: 10/02/2011

CLINICAL DATA: Leg swelling

EXAM:
CHEST - 2 VIEW

[chest pa]
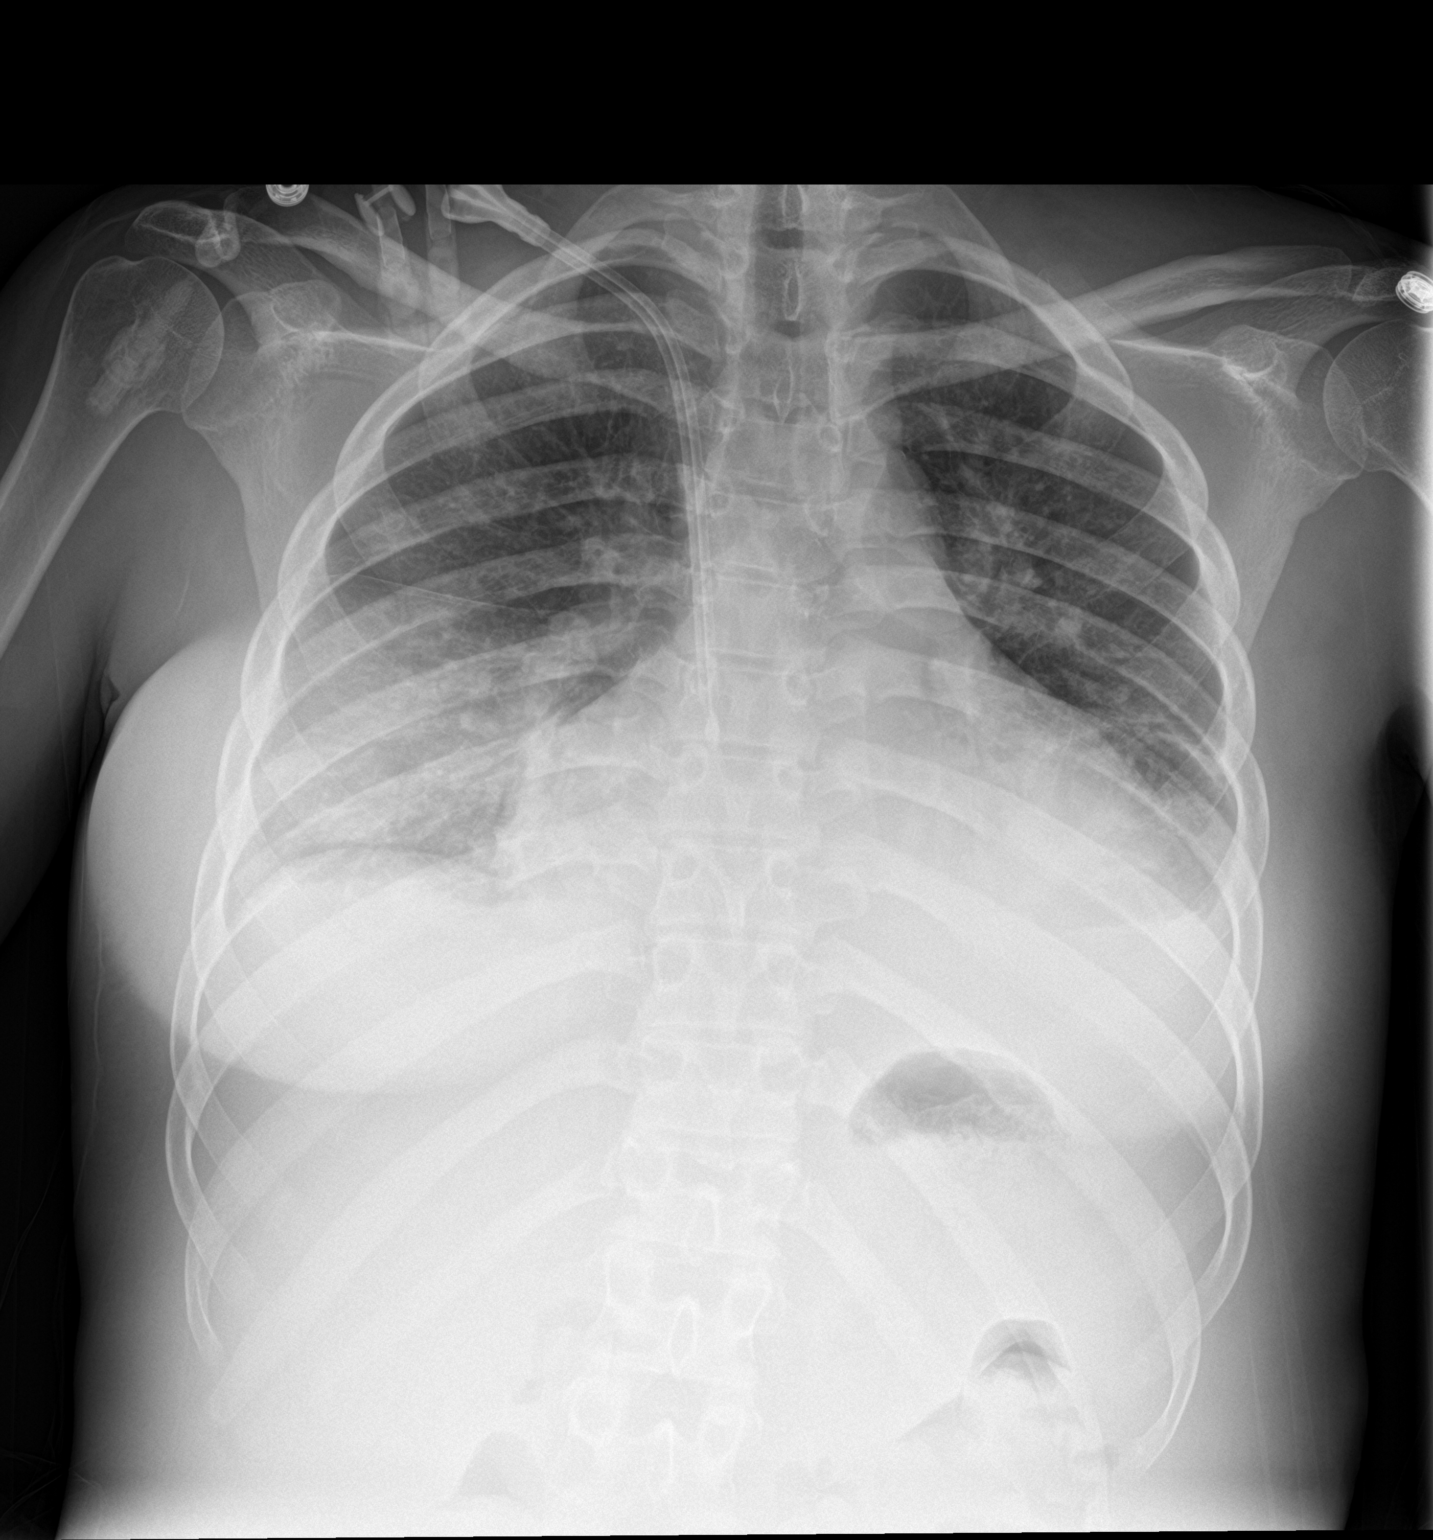

[chest lat]
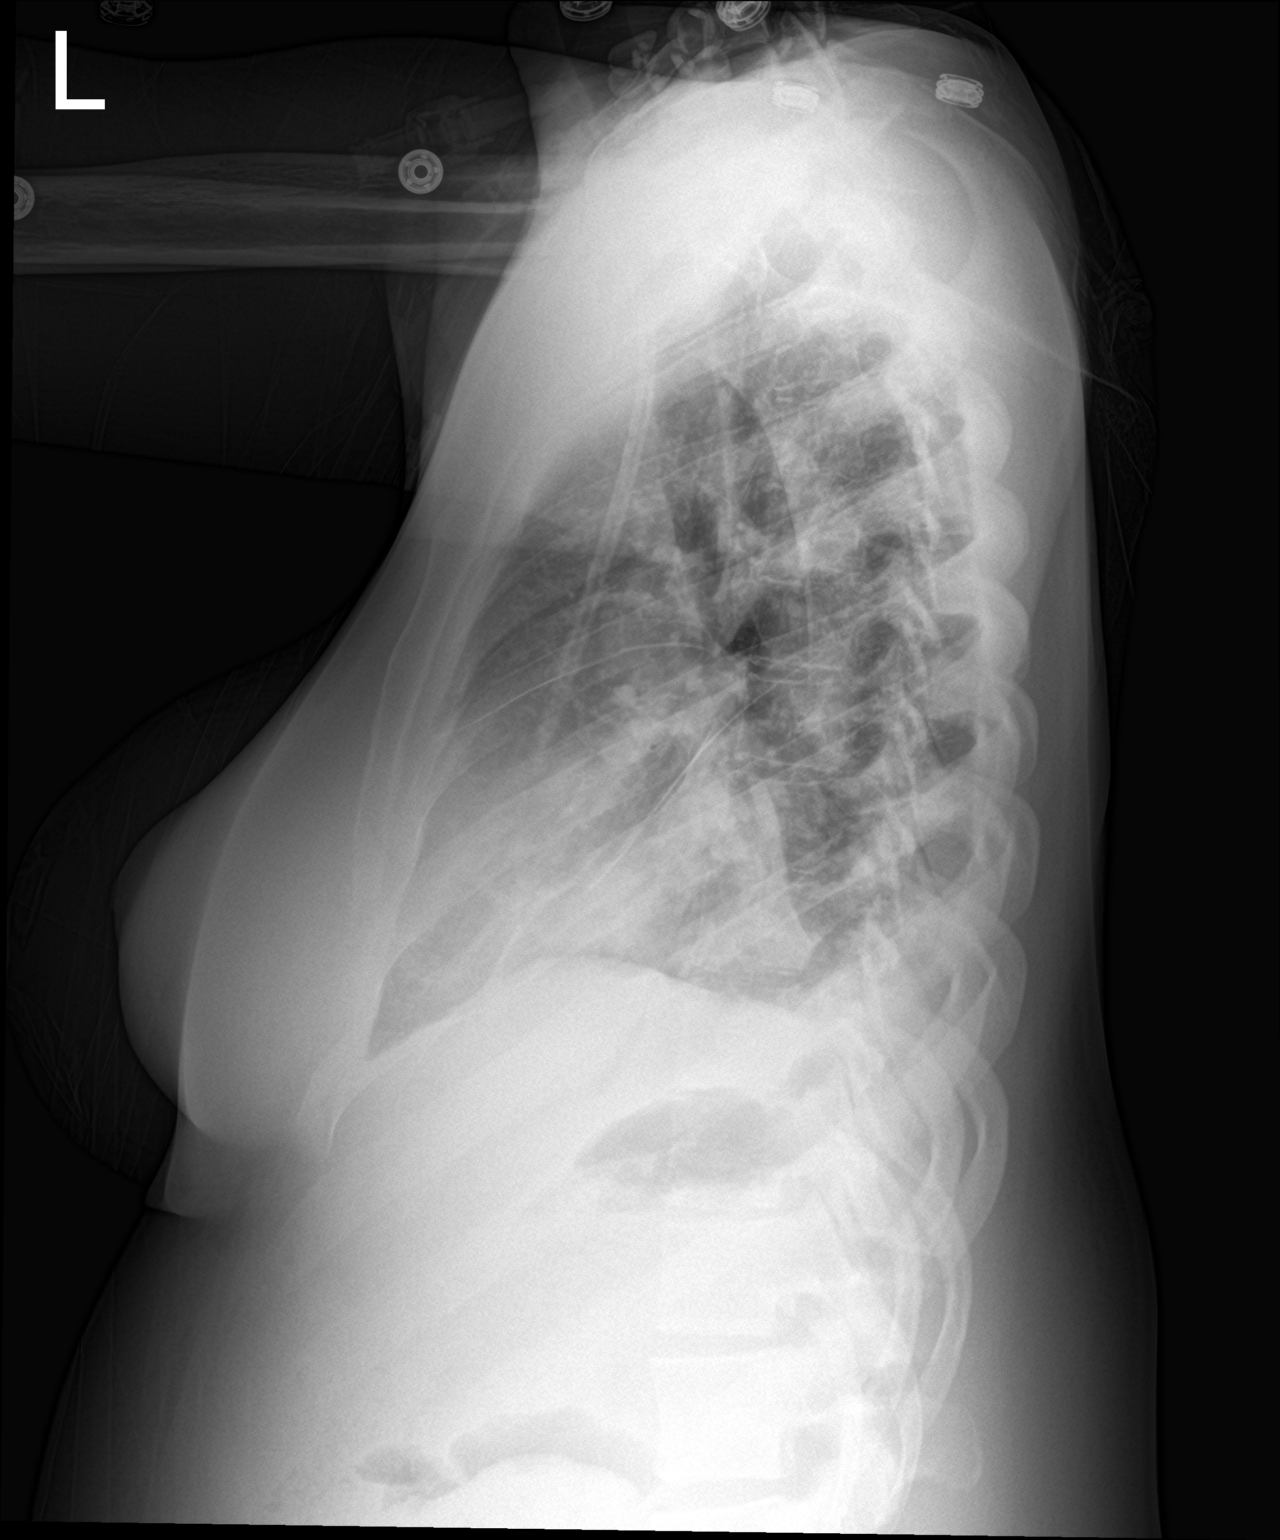

[2 of 2 positions shown; findings below may reference images not displayed]

FINDINGS: Cardiomegaly. Large-bore right neck multi lumen vascular catheter.
New, small, layering bilateral pleural effusions and associated
atelectasis or consolidation. Osseous structures unremarkable.
IMPRESSION: 1. Cardiomegaly.
2. New, small, layering bilateral pleural effusions and associated
atelectasis or consolidation.

## 2023-03-03 IMAGING — XA IR FLUORO GUIDE CV LINE*R*
1 series · 1 of 1 positions shown · non-contrast
Comparison: none

INDICATION: Need for long-term dialysis

[Series 1: fl angio · 1 of 1 slices shown]
[im 1/1]
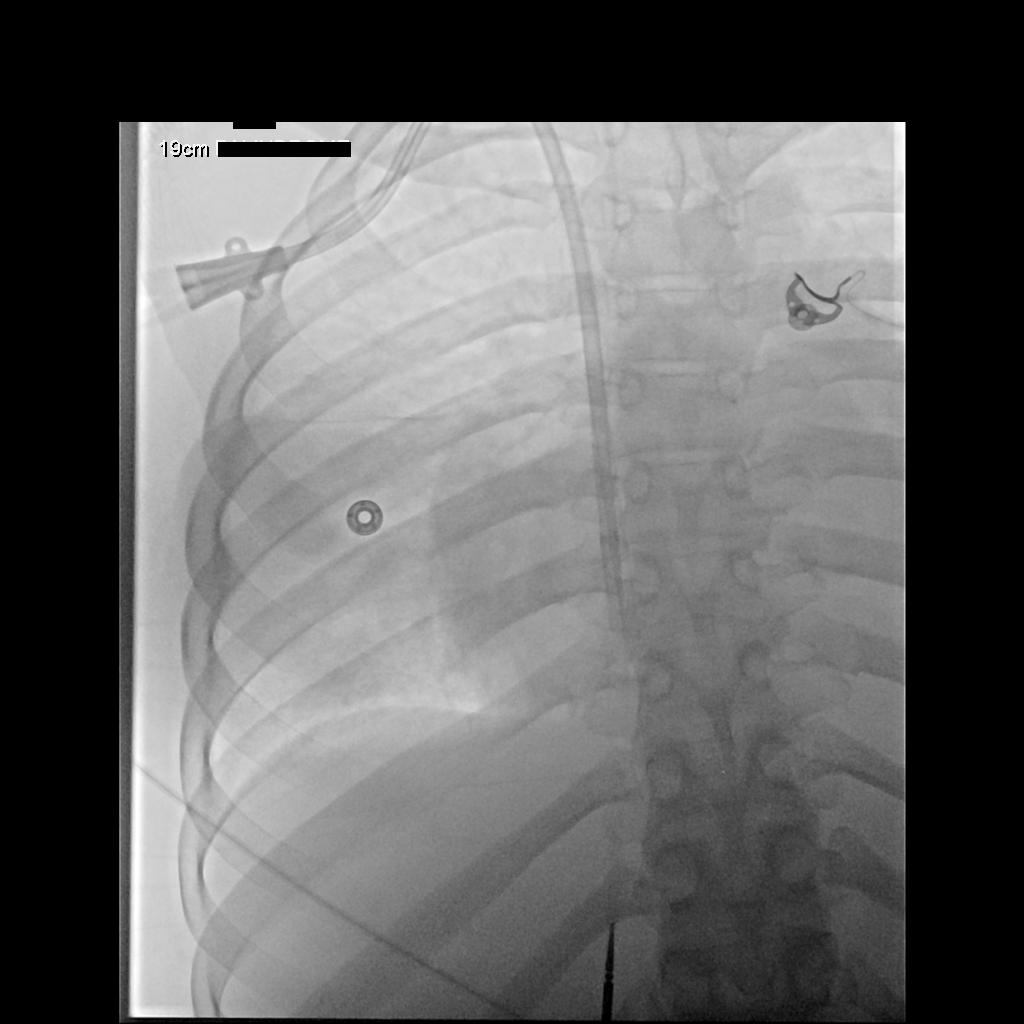

[1 of 1 positions shown; findings below may reference images not displayed]

EXAM:
Conversion of existing central venous catheter for a tunneled
hemodialysis catheter using fluoroscopic guidance

MEDICATIONS:
Per EMR

ANESTHESIA/SEDATION:
Moderate (conscious) sedation was employed during this procedure. A
total of Versed 1 mg and Fentanyl 50 mcg was administered
intravenously.

Moderate Sedation Time: 23 minutes. The patient's level of
consciousness and vital signs were monitored continuously by
radiology nursing throughout the procedure under my direct
supervision.

FLUOROSCOPY TIME:  Fluoroscopy Time: 0.5 minutes (5 mGy)

COMPLICATIONS:
None immediate.

PROCEDURE:
Informed written consent was obtained from the patient after a
thorough discussion of the procedural risks, benefits and
alternatives. All questions were addressed. Maximal Sterile Barrier
Technique was utilized including caps, mask, sterile gowns, sterile
gloves, sterile drape, hand hygiene and skin antiseptic. A timeout
was performed prior to the initiation of the procedure.

The patient was placed supine on the exam table. The right neck and
chest was prepped and draped in the standard sterile fashion with
inclusion of the existing temporary dialysis catheter the sterile
field. Over an 035 wire, the existing dialysis catheter was removed
for a 14 French dilator. Attention was then turned to an
infraclavicular location, where a small dermatotomy was made after
the administration of additional local anesthetic. From this
location, a 19 cm tip-to-cuff hemodialysis catheter was tunneled to
the venotomy site. A peel-away sheath was then advanced over the
access wire. Through this peel-away sheath, the hemodialysis
catheter was advanced into the central veins, such that the tip was
positioned near the superior cavoatrial junction. The line was found
to flush and aspirate appropriately. It was sutured to the skin
using 0 silk suture, and the venotomy was closed with Dermabond. A
sterile dressing was placed. The patient tolerated the procedure
well without immediate complication.
IMPRESSION: Successful conversion of existing central venous catheter for a new
tunneled hemodialysis catheter via the right internal jugular vein.
The line is ready for immediate use.

## 2023-03-04 DIAGNOSIS — N2581 Secondary hyperparathyroidism of renal origin: Secondary | ICD-10-CM | POA: Diagnosis not present

## 2023-03-04 DIAGNOSIS — Z992 Dependence on renal dialysis: Secondary | ICD-10-CM | POA: Diagnosis not present

## 2023-03-04 DIAGNOSIS — E1040 Type 1 diabetes mellitus with diabetic neuropathy, unspecified: Secondary | ICD-10-CM | POA: Diagnosis not present

## 2023-03-04 DIAGNOSIS — N186 End stage renal disease: Secondary | ICD-10-CM | POA: Diagnosis not present

## 2023-03-04 DIAGNOSIS — E1022 Type 1 diabetes mellitus with diabetic chronic kidney disease: Secondary | ICD-10-CM | POA: Diagnosis not present

## 2023-03-04 DIAGNOSIS — D631 Anemia in chronic kidney disease: Secondary | ICD-10-CM | POA: Diagnosis not present

## 2023-03-04 DIAGNOSIS — E1043 Type 1 diabetes mellitus with diabetic autonomic (poly)neuropathy: Secondary | ICD-10-CM | POA: Diagnosis not present

## 2023-03-07 DIAGNOSIS — E1043 Type 1 diabetes mellitus with diabetic autonomic (poly)neuropathy: Secondary | ICD-10-CM | POA: Diagnosis not present

## 2023-03-07 DIAGNOSIS — E1040 Type 1 diabetes mellitus with diabetic neuropathy, unspecified: Secondary | ICD-10-CM | POA: Diagnosis not present

## 2023-03-07 DIAGNOSIS — D631 Anemia in chronic kidney disease: Secondary | ICD-10-CM | POA: Diagnosis not present

## 2023-03-07 DIAGNOSIS — E1022 Type 1 diabetes mellitus with diabetic chronic kidney disease: Secondary | ICD-10-CM | POA: Diagnosis not present

## 2023-03-07 DIAGNOSIS — N2581 Secondary hyperparathyroidism of renal origin: Secondary | ICD-10-CM | POA: Diagnosis not present

## 2023-03-07 DIAGNOSIS — Z992 Dependence on renal dialysis: Secondary | ICD-10-CM | POA: Diagnosis not present

## 2023-03-07 DIAGNOSIS — N186 End stage renal disease: Secondary | ICD-10-CM | POA: Diagnosis not present

## 2023-03-08 ENCOUNTER — Encounter: Payer: Self-pay | Admitting: Podiatry

## 2023-03-08 ENCOUNTER — Ambulatory Visit: Payer: 59 | Admitting: Podiatry

## 2023-03-08 DIAGNOSIS — E10621 Type 1 diabetes mellitus with foot ulcer: Secondary | ICD-10-CM | POA: Diagnosis not present

## 2023-03-08 DIAGNOSIS — L97512 Non-pressure chronic ulcer of other part of right foot with fat layer exposed: Secondary | ICD-10-CM | POA: Diagnosis not present

## 2023-03-08 MED ORDER — AMOXICILLIN 500 MG PO CAPS: 500.0000 mg | ORAL_CAPSULE | Freq: Two times a day (BID) | ORAL | 0 refills | Status: AC

## 2023-03-08 MED ORDER — SILVER SULFADIAZINE 1 % EX CREA: 1.0000 | TOPICAL_CREAM | Freq: Every day | CUTANEOUS | 0 refills | Status: DC

## 2023-03-08 NOTE — Progress Notes (Unsigned)
Chief Complaint  Patient presents with   Diabetic Ulcer    6 week follow up right foot ulcer not painful   HPI: 27 y.o. female presenting today with c/o possible reopening of right foot wound.  She noticed drainage a week ago.  She has not always been wearing the off-loading inserts in her shoes.  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 10/21/2021   Diabetic autonomic neuropathy ([name])    Diabetic peripheral neuropathy ([name])    Environmental allergies    Goiter    History of blood transfusion    Hypertension    no meds currently   Hypoglycemia associated with diabetes ([name])    Musculoskeletal pain 04/20/2022   Tachycardia    Thyroiditis, autoimmune    Type 1 diabetes mellitus in patient age 92-19 years with HbA1C goal below 7.5     Past Surgical History:  Procedure Laterality Date   AV FISTULA PLACEMENT Right 12/08/2021   Procedure: ARTERIOVENOUS  FISTULA CREATION VERSUS GRAFT;  Surgeon: Larina Earthly, MD;  Location: [facility];  Service: Vascular;  Laterality: Right;   BASCILIC VEIN TRANSPOSITION Right 01/12/2022   Procedure: RIGHT ARM SECOND STAGE BASILIC VEIN TRANSPOSITION;  Surgeon: Larina Earthly, MD;  Location: [facility];  Service: Vascular;  Laterality: Right;   BIOPSY  08/27/2016   Procedure: BIOPSY;  Surgeon: West Bali, MD;  Location: [facility];  Service: Endoscopy;;  duodenum; gastric   BIOPSY  06/27/2019   Procedure: BIOPSY;  Surgeon: Corbin Ade, MD;  Location: [facility];  Service: Endoscopy;;   BIOPSY  02/09/2022   Procedure: BIOPSY;  Surgeon: Lanelle Bal, DO;  Location: [facility];  Service: Endoscopy;;   BREAST CYST EXCISION Right 11/12/2021   Procedure: RIGHT BREAST ABSCESS  INCISION AND DRAINAGE;  Surgeon: Emelia Loron, MD;  Location: [facility] OR;  Service: General;  Laterality: Right;  LMA   COLONOSCOPY     COLONOSCOPY WITH PROPOFOL N/A 02/09/2022   Procedure: COLONOSCOPY WITH PROPOFOL;  Surgeon: Lanelle Bal, DO;  Location: [facility];  Service: Endoscopy;  Laterality: N/A;  7:30am, asa 3, dialysis pt   ESOPHAGOGASTRODUODENOSCOPY N/A 08/27/2016   Dr. Darrick Penna: mild gastritis. Negative celiac. No obvious source for dyspepsia/diarrhea   ESOPHAGOGASTRODUODENOSCOPY (EGD) WITH PROPOFOL N/A 06/27/2019   rourk: Focal abnormality of the gastric mucosa likely due to trauma (heaving).  Biopsy showed mild gastritis, negative for H. pylori.  Esophageal dilation for history of dysphagia but normal-appearing esophagus.   ESOPHAGOGASTRODUODENOSCOPY (EGD) WITH PROPOFOL N/A 02/09/2022   Procedure: ESOPHAGOGASTRODUODENOSCOPY (EGD) WITH PROPOFOL;  Surgeon: Lanelle Bal, DO;  Location: [facility];  Service: Endoscopy;  Laterality: N/A;   IR FLUORO GUIDE CV LINE RIGHT  10/13/2021   IR FLUORO GUIDE CV LINE RIGHT  10/19/2021   IR US GUIDE VASC ACCESS RIGHT  10/13/2021   IRRIGATION AND DEBRIDEMENT ABSCESS N/A 10/04/2021   Procedure: IRRIGATION AND DEBRIDEMENT NECK ABSCESS;  Surgeon: Axel Filler, MD;  Location: [facility] OR;  Service: General;  Laterality: N/A;   No Known Allergies   PHYSICAL EXAM: General: The patient is alert and oriented x3 in no acute distress.  Dermatology: Skin is warm, dry and supple  bilateral lower extremities. Interspaces are clear of maceration and debris.      Wound 1 description:  Location:  right submet 1    Depth:  to subQ tissue    Wound Border:  hemorrhagic callus    Wound Base:  granular Odor?:  musty    Surrounding Tissue:  minimal edema, no erythema    Infected?:  mildly    Necrosis?:  no    Pain?:  minimal (has neuropathy)    Tunneling:  no    Dimensions (cm):  0.4cm x 0.4cm x 0.3cm  Vascular: Pedal pulses are intact  Neurological:  Light touch sensation diminished  Musculoskeletal Exam: plantarflexed first metatarsal     Latest Ref Rng & Units 12/14/2022    3:46 PM 07/20/2022    2:40 PM  Hemoglobin A1C  Hemoglobin-A1c 4.8 - 5.6 % 10.5  10.5     ASSESSMENT / PLAN OF CARE: 1. Type 1 diabetes mellitus with foot ulcer (CODE) ([name])   2. Ulcer of right foot, with fat layer exposed ([name])      Meds ordered this encounter  Medications   amoxicillin (AMOXIL) 500 MG capsule    Sig: Take 1 capsule (500 mg total) by mouth 2 (two) times daily for 10 days.    Dispense:  20 capsule    Refill:  0   silver sulfADIAZINE (SILVADENE) 1 % cream    Sig: Apply 1 Application topically daily. Apply to wound daily and cover with gauze dressing    Dispense:  50 g    Refill:  0   The ulceration was sharply debrided of hyperkeratotic and devitalized soft tissue with sterile #312 blade to the level of subQ .  Hemostasis obtained.  Antibiotic ointment and DSD applied.  Reviewed off-loading with patient.  Surgical off-loading peg shoe dispensed for patient to wear at all times WB.   Reviewed daily dressing changes with patient.  Rx doxycycline and silvadene cream sent to her pharmacy.  Discussed risks / concerns regarding ulcer with patient and possible sequelae if left untreated.  Stressed importance of infection prevention at home. Short-term goals are: prevent infection, off-load ulcer, heal ulcer Long-term goals are:  prevent recurrence, prevent amputation.   Return in about 2 weeks (around 03/22/2023) for f/u ulcer.   Clerance Lav, DPM, FACFAS [facility]     [address] [place], [place] [address]                Office [phone]  Fax [phone]

## 2023-03-09 DIAGNOSIS — E1040 Type 1 diabetes mellitus with diabetic neuropathy, unspecified: Secondary | ICD-10-CM | POA: Diagnosis not present

## 2023-03-09 DIAGNOSIS — Z992 Dependence on renal dialysis: Secondary | ICD-10-CM | POA: Diagnosis not present

## 2023-03-09 DIAGNOSIS — E1022 Type 1 diabetes mellitus with diabetic chronic kidney disease: Secondary | ICD-10-CM | POA: Diagnosis not present

## 2023-03-09 DIAGNOSIS — D631 Anemia in chronic kidney disease: Secondary | ICD-10-CM | POA: Diagnosis not present

## 2023-03-09 DIAGNOSIS — E1043 Type 1 diabetes mellitus with diabetic autonomic (poly)neuropathy: Secondary | ICD-10-CM | POA: Diagnosis not present

## 2023-03-09 DIAGNOSIS — N186 End stage renal disease: Secondary | ICD-10-CM | POA: Diagnosis not present

## 2023-03-09 DIAGNOSIS — N2581 Secondary hyperparathyroidism of renal origin: Secondary | ICD-10-CM | POA: Diagnosis not present

## 2023-03-10 DIAGNOSIS — Z992 Dependence on renal dialysis: Secondary | ICD-10-CM | POA: Diagnosis not present

## 2023-03-10 DIAGNOSIS — N186 End stage renal disease: Secondary | ICD-10-CM | POA: Diagnosis not present

## 2023-03-10 DIAGNOSIS — N032 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: Secondary | ICD-10-CM | POA: Diagnosis not present

## 2023-03-11 ENCOUNTER — Telehealth: Payer: Self-pay

## 2023-03-11 NOTE — Telephone Encounter (Signed)
Pharmacy Patient Advocate Encounter  Received notification from [facility] that Prior Authorization for Candie Mile has been APPROVED through 05/09/2024   PA #/Case ID/Reference #: [id number]

## 2023-03-12 DIAGNOSIS — N186 End stage renal disease: Secondary | ICD-10-CM | POA: Diagnosis not present

## 2023-03-12 DIAGNOSIS — Z992 Dependence on renal dialysis: Secondary | ICD-10-CM | POA: Diagnosis not present

## 2023-03-12 DIAGNOSIS — N2581 Secondary hyperparathyroidism of renal origin: Secondary | ICD-10-CM | POA: Diagnosis not present

## 2023-03-15 ENCOUNTER — Ambulatory Visit: Payer: 59 | Admitting: Family Medicine

## 2023-03-15 VITALS — BP 99/67 | HR 104 | Temp 97.2°F | Ht 67.0 in | Wt 113.0 lb

## 2023-03-15 DIAGNOSIS — D631 Anemia in chronic kidney disease: Secondary | ICD-10-CM | POA: Diagnosis not present

## 2023-03-15 DIAGNOSIS — N2581 Secondary hyperparathyroidism of renal origin: Secondary | ICD-10-CM | POA: Diagnosis not present

## 2023-03-15 DIAGNOSIS — E1022 Type 1 diabetes mellitus with diabetic chronic kidney disease: Secondary | ICD-10-CM | POA: Diagnosis not present

## 2023-03-15 DIAGNOSIS — E785 Hyperlipidemia, unspecified: Secondary | ICD-10-CM

## 2023-03-15 DIAGNOSIS — N186 End stage renal disease: Secondary | ICD-10-CM | POA: Diagnosis not present

## 2023-03-15 DIAGNOSIS — Z992 Dependence on renal dialysis: Secondary | ICD-10-CM | POA: Diagnosis not present

## 2023-03-15 DIAGNOSIS — E039 Hypothyroidism, unspecified: Secondary | ICD-10-CM | POA: Diagnosis not present

## 2023-03-15 DIAGNOSIS — I10 Essential (primary) hypertension: Secondary | ICD-10-CM

## 2023-03-15 DIAGNOSIS — E1065 Type 1 diabetes mellitus with hyperglycemia: Secondary | ICD-10-CM

## 2023-03-15 MED ORDER — ROSUVASTATIN CALCIUM 10 MG PO TABS: 10.0000 mg | ORAL_TABLET | Freq: Every day | ORAL | 3 refills | Status: DC

## 2023-03-15 NOTE — Progress Notes (Signed)
Subjective:  Patient ID: Lindsey Murillo, female    DOB: 1995/09/29  Age: 27 y.o. MRN: [id number]  CC:   Chief Complaint  Patient presents with   DM1    States had dialysis today , x 3 week    HPI:  27 year old female presents for follow-up.  Patient had dialysis today.  Weight is down to 113.  Patient states that her blood sugars have been improved.  She states her last A1c was in the sevens per her blood work from dialysis.  She follows with Endo.  No reports of hypoglycemia.  Patient having ongoing issues with difficulty seeing out of her left eye.  Has a cataract.  Advised her that she needs to follow-up with her eye doctor.  Blood pressure is well-controlled.  Patient recently seen by podiatry for a wound.  Improving.  Currently on antibiotic therapy.  Patient Active Problem List   Diagnosis Date Noted   Insulin long-term use ([name]) 12/23/2022   Amenorrhea, secondary 10/26/2022   Vitamin D deficiency 08/26/2022   Hyperlipidemia 07/21/2022   Non-adherence to medical treatment 01/21/2022   ESRD (end stage renal disease) ([name]) 12/18/2021   Secondary hyperparathyroidism of renal origin ([name]) 10/21/2021   Physical deconditioning 10/07/2021   Depression 09/25/2021   Anemia in ESRD (end-stage renal disease) ([name]) 09/25/2021   Nephrotic syndrome 05/27/2021   Chronic pancreatitis ([name]) 08/22/2019   Pancreatic pseudocyst/cyst 08/22/2019   GERD (gastroesophageal reflux disease) 08/22/2019   Uncontrolled type 1 diabetes mellitus with hyperglycemia ([name]) 05/12/2015   Essential hypertension, benign 11/30/2012   Asthma     Social Hx   Social History   Socioeconomic History   Marital status: Single    Spouse name: Not on file   Number of children: 0   Years of education: Not on file   Highest education level: High school graduate  Occupational History   Occupation: unemployed  Tobacco Use   Smoking status: Never    Passive exposure: Never   Smokeless tobacco:  Never  Vaping Use   Vaping status: Never Used  Substance and Sexual Activity   Alcohol use: No   Drug use: No   Sexual activity: Not Currently    Birth control/protection: Abstinence, None  Other Topics Concern   Not on file  Social History Narrative   Not on file   Social Determinants of Health   Financial Resource Strain: Low Risk  (01/14/2023)   Overall Financial Resource Strain (CARDIA)    Difficulty of Paying Living Expenses: Not hard at all  Food Insecurity: No Food Insecurity (01/14/2023)   Hunger Vital Sign    Worried About Running Out of Food in the Last Year: Never true    Ran Out of Food in the Last Year: Never true  Transportation Needs: No Transportation Needs (01/14/2023)   PRAPARE - Administrator, Civil Service (Medical): No    Lack of Transportation (Non-Medical): No  Physical Activity: Insufficiently Active (01/14/2023)   Exercise Vital Sign    Days of Exercise per Week: 4 days    Minutes of Exercise per Session: 30 min  Stress: No Stress Concern Present (01/14/2023)   [employer] of Occupational Health - Occupational Stress Questionnaire    Feeling of Stress : Only a little  Social Connections: Moderately Isolated (01/14/2023)   Social Connection and Isolation Panel [NHANES]    Frequency of Communication with Friends and Family: More than three times a week    Frequency of Social Gatherings with  Friends and Family: More than three times a week    Attends Religious Services: More than 4 times per year    Active Member of Clubs or Organizations: No    Attends Banker Meetings: Never    Marital Status: Never married    Review of Systems Per HPI  Objective:  BP 99/67   Pulse (!) 104   Temp (!) 97.2 F (36.2 C)   Ht 5\' 7"  (1.702 m)   Wt 113 lb (51.3 kg)   SpO2 97%   BMI 17.70 kg/m      03/15/2023    3:07 PM 02/21/2023    4:19 PM 02/02/2023    3:00 PM  BP/Weight  Systolic BP 99 122 131  Diastolic BP 67 82 95  Wt. (Lbs) 113  120.5   BMI 17.7 kg/m2 18.87 kg/m2     Physical Exam Constitutional:      General: She is not in acute distress. HENT:     Head: Normocephalic and atraumatic.  Eyes:     Comments: Left eye - cloudy, conjunctival erythema.   Cardiovascular:     Rate and Rhythm: Normal rate and regular rhythm.  Pulmonary:     Effort: Pulmonary effort is normal.     Breath sounds: Normal breath sounds.  Neurological:     Mental Status: She is alert.     Lab Results  Component Value Date   WBC 8.2 02/02/2023   HGB 13.1 02/02/2023   HCT 41.2 02/02/2023   PLT 218 02/02/2023   GLUCOSE 107 (H) 02/02/2023   CHOL 190 07/20/2022   TRIG 500 (H) 07/20/2022   HDL 73 07/20/2022   LDLCALC 46 07/20/2022   ALT 38 02/02/2023   AST 26 02/02/2023   NA 134 (L) 02/02/2023   K 4.3 02/02/2023   CL 93 (L) 02/02/2023   CREATININE 5.96 (H) 02/02/2023   BUN 26 (H) 02/02/2023   CO2 28 02/02/2023   TSH 1.060 10/26/2022   INR 1.4 (H) 10/03/2021   HGBA1C 10.5 (H) 12/14/2022   MICROALBUR 1,195.2 (H) 06/13/2021     Assessment & Plan:   Problem List Items Addressed This Visit       Cardiovascular and Mediastinum   Essential hypertension, benign    BP soft here today.      Relevant Medications   rosuvastatin (CRESTOR) 10 MG tablet     Endocrine   Uncontrolled type 1 diabetes mellitus with hyperglycemia ([name]) - Primary    Improving per her report.  Continue close follow-up with endocrinology.      Relevant Medications   rosuvastatin (CRESTOR) 10 MG tablet     Other   Hyperlipidemia    Lipids have been at goal.  Continue statin.      Relevant Medications   rosuvastatin (CRESTOR) 10 MG tablet    Meds ordered this encounter  Medications   rosuvastatin (CRESTOR) 10 MG tablet    Sig: Take 1 tablet (10 mg total) by mouth daily.    Dispense:  90 tablet    Refill:  3    Follow-up:  Return in about 6 months (around 09/12/2023) for Follow up Chronic medical issues.  Everlene Other DO [facility]  Family Medicine

## 2023-03-15 NOTE — Assessment & Plan Note (Signed)
Lipids have been at goal.  Continue statin.

## 2023-03-15 NOTE — Assessment & Plan Note (Addendum)
BP soft here today.

## 2023-03-15 NOTE — Assessment & Plan Note (Signed)
Improving per her report.  Continue close follow-up with endocrinology.

## 2023-03-15 NOTE — Patient Instructions (Signed)
Continue your medications.  See your eye doctor.   Follow up in 6 months.  Take care  Dr. Adriana Simas

## 2023-03-16 DIAGNOSIS — N2581 Secondary hyperparathyroidism of renal origin: Secondary | ICD-10-CM | POA: Diagnosis not present

## 2023-03-16 DIAGNOSIS — E1022 Type 1 diabetes mellitus with diabetic chronic kidney disease: Secondary | ICD-10-CM | POA: Diagnosis not present

## 2023-03-16 DIAGNOSIS — Z992 Dependence on renal dialysis: Secondary | ICD-10-CM | POA: Diagnosis not present

## 2023-03-16 DIAGNOSIS — D631 Anemia in chronic kidney disease: Secondary | ICD-10-CM | POA: Diagnosis not present

## 2023-03-16 DIAGNOSIS — E039 Hypothyroidism, unspecified: Secondary | ICD-10-CM | POA: Diagnosis not present

## 2023-03-16 DIAGNOSIS — N186 End stage renal disease: Secondary | ICD-10-CM | POA: Diagnosis not present

## 2023-03-16 NOTE — Progress Notes (Deleted)
GI Office Note    Referring Provider: [name], DO Primary Care Physician:  [name], DO Primary Gastroenterologist: Hennie Duos. Marletta Lor, DO  Date:  03/16/2023  ID:  Lindsey Murillo, DOB 1995-06-14, MRN [id number]   Chief Complaint   No chief complaint on file.  History of Present Illness  Lindsey Murillo is a 27 y.o. female with a history of chronic pancreatitis with stable pseudocyst, gastritis, ESRD on dialysis M, W, F and undergoing transplant workup, asthma, type 1 diabetes, HTN, cataracts presenting today for follow-up. ***   EGD 03/08/2022: -Gastritis s/p biopsy -Normal duodenum -Biopsy negative for H. pylori, slight chronic inflammation. -Use pantoprazole 40 mg once daily -Avoid NSAIDs.   Colonoscopy 02/09/2022: -Nonbleeding internal hemorrhoids -Exam otherwise normal -Repeat colonoscopy at age 85 or sooner   ED visit 05/10/2022 for diarrhea. Presenting with complaint of diarrhea and nausea.  Was given prescription for Zofran and Cipro.  GI pathogen panel negative.   ED visit 05/27/2022 for nausea/vomiting and back pain. EKG with sinus tach and left atrial enlargement.  Low voltage.  Reportedly missed dialysis session due to dizziness.  Also with low blood sugar at this time which is likely contributing to her nausea as well.  Treated with IV fluids and dextrose.   ED visit 06/12/2022 for weakness and vomiting. Was given GI cocktail and prescription for Zofran to use 4 mg every 8 hours as needed.  Also given 1 L fluids.  EKG with sinus rhythm and left atrial enlargement with incomplete right bundle branch block.  Prolonged QT in precordial leads.  No significant changes since prior EKG on 06/02/2022.   Office visit 06/17/22. Breakthrough GERD  twice per week. Occasional nausea. No dysphagia. Occasional looser stools. Increase PPI to BID, zofran as needed. Renal and GERD diet advised. CBC in 2 months.  Last office visit 09/16/22.  Denies any abdominal pain but did  report some mild nausea.  Reflux has been much more controlled with twice daily PPI.  Still having fatigue, especially on dialysis days.  Has good appetite.  Denies any constipation, sometimes diarrhea 1-2 times per week.  Receives iron intravenously at times with dialysis.  Continuing to undergo workup for kidney transplant.  Having upcoming cataract surgery.  Advised to continue with Zofran 40 mg twice daily, Zofran as needed.  Follow GERD diet and check CBC with next blood draw.  Labs 02/14/2023: Hemoglobin 12.4, platelets 174, creatinine 9.29, albumin 3.3, alk phos 255 (down from 266 in September 2024 and 315 in July 2024)  Today:    Wt Readings from Last 3 Encounters:  03/15/23 113 lb (51.3 kg)  02/21/23 120 lb 8 oz (54.7 kg)  02/02/23 123 lb 7.3 oz (56 kg)    Current Outpatient Medications  Medication Sig Dispense Refill   acetaminophen (TYLENOL) 325 MG tablet Take by mouth.     amoxicillin (AMOXIL) 500 MG capsule Take 1 capsule (500 mg total) by mouth 2 (two) times daily for 10 days. 20 capsule 0   Blood Glucose Monitoring Suppl (ACCU-CHEK GUIDE ME) w/Device KIT 1 Piece by Does not apply route as directed. 1 kit 0   cetirizine HCl (ZYRTEC) 1 MG/ML solution Take 1.25 ml twice a day as needed for a runny nose or itch 118 mL 2   Continuous Glucose Receiver (DEXCOM G7 RECEIVER) DEVI by Does not apply route.     Continuous Glucose Sensor (DEXCOM G7 SENSOR) MISC by Does not apply route.     fluticasone (  FLONASE) 50 MCG/ACT nasal spray Place 2 sprays into both nostrils daily as needed for allergies or rhinitis. 16 g 5   Fluticasone Furoate (ARNUITY ELLIPTA) 100 MCG/ACT AEPB Inhale 1 Dose into the lungs daily. 30 each 5   glucose blood (ACCU-CHEK GUIDE) test strip Use to monitor glucose 4 times a day as instructed 150 each 2   insulin aspart (FIASP FLEXTOUCH) 100 UNIT/ML FlexTouch Pen Inject 5-8 Units into the skin 3 (three) times daily before meals. 15 mL 3   insulin degludec (TRESIBA  FLEXTOUCH) 100 UNIT/ML FlexTouch Pen Inject 15 Units into the skin at bedtime. 15 mL 3   Insulin Pen Needle 31G X 4 MM MISC Use as directed to administer insulin 4 times daily. 300 each 3   medroxyPROGESTERone (PROVERA) 10 MG tablet Take provera 10 mg for 10 days every 3 months 30 tablet 2   ondansetron (ZOFRAN-ODT) 4 MG disintegrating tablet 4mg  ODT q4 hours prn nausea/vomit 10 tablet 0   oxyCODONE (OXY IR/ROXICODONE) 5 MG immediate release tablet      pantoprazole (PROTONIX) 40 MG tablet Take 1 tablet (40 mg total) by mouth 2 (two) times daily before a meal. 180 tablet 3   rosuvastatin (CRESTOR) 10 MG tablet Take 1 tablet (10 mg total) by mouth daily. 90 tablet 3   scopolamine (TRANSDERM-SCOP) 1 MG/3DAYS Place 1 patch (1.5 mg total) onto the skin every 3 (three) days. 10 patch 12   silver sulfADIAZINE (SILVADENE) 1 % cream Apply 1 Application topically daily. Apply to wound daily and cover with gauze dressing 50 g 0   sucroferric oxyhydroxide (VELPHORO) 500 MG chewable tablet Chew 500 mg by mouth 3 (three) times daily with meals.     Vitamin D, Ergocalciferol, (DRISDOL) 1.25 MG (50000 UNIT) CAPS capsule Take 50,000 Units by mouth every 7 (seven) days.     No current facility-administered medications for this visit.    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 10/21/2021   Diabetic autonomic neuropathy ([name])    Diabetic peripheral neuropathy ([name])    Environmental allergies    Goiter    History of blood transfusion    Hypertension    no meds currently   Hypoglycemia associated with diabetes ([name])    Musculoskeletal pain 04/20/2022   Tachycardia    Thyroiditis, autoimmune    Type 1 diabetes mellitus in patient age 17-19 years with HbA1C goal below  7.5     Past Surgical History:  Procedure Laterality Date   AV FISTULA PLACEMENT Right 12/08/2021   Procedure: ARTERIOVENOUS  FISTULA CREATION VERSUS GRAFT;  Surgeon: Larina Earthly, MD;  Location: [facility];  Service: Vascular;  Laterality: Right;   BASCILIC VEIN TRANSPOSITION Right 01/12/2022   Procedure: RIGHT ARM SECOND STAGE BASILIC VEIN TRANSPOSITION;  Surgeon: Larina Earthly, MD;  Location: [facility];  Service: Vascular;  Laterality: Right;   BIOPSY  08/27/2016   Procedure: BIOPSY;  Surgeon: West Bali, MD;  Location: [facility];  Service: Endoscopy;;  duodenum; gastric   BIOPSY  06/27/2019   Procedure: BIOPSY;  Surgeon: Corbin Ade, MD;  Location: [facility];  Service: Endoscopy;;   BIOPSY  02/09/2022   Procedure: BIOPSY;  Surgeon: Lanelle Bal, DO;  Location: [facility];  Service: Endoscopy;;   BREAST CYST EXCISION Right 11/12/2021   Procedure: RIGHT BREAST ABSCESS INCISION AND DRAINAGE;  Surgeon: Emelia Loron, MD;  [facility];  Service: General;  Laterality: Right;  LMA   COLONOSCOPY     COLONOSCOPY WITH PROPOFOL N/A 02/09/2022   Procedure: COLONOSCOPY WITH PROPOFOL;  Surgeon: Lanelle Bal, DO;  Location: [facility];  Service: Endoscopy;  Laterality: N/A;  7:30am, asa 3, dialysis pt   ESOPHAGOGASTRODUODENOSCOPY N/A 08/27/2016   Dr. Darrick Penna: mild gastritis. Negative celiac. No obvious source for dyspepsia/diarrhea   ESOPHAGOGASTRODUODENOSCOPY (EGD) WITH PROPOFOL N/A 06/27/2019   rourk: Focal abnormality of the gastric mucosa likely due to trauma (heaving).  Biopsy showed mild gastritis, negative for H. pylori.  Esophageal dilation for history of dysphagia but normal-appearing esophagus.   ESOPHAGOGASTRODUODENOSCOPY (EGD) WITH PROPOFOL N/A 02/09/2022   Procedure: ESOPHAGOGASTRODUODENOSCOPY (EGD) WITH PROPOFOL;  Surgeon: Lanelle Bal, DO;  Location: [facility];  Service: Endoscopy;  Laterality: N/A;   IR FLUORO GUIDE CV LINE RIGHT  10/13/2021   IR FLUORO  GUIDE CV LINE RIGHT  10/19/2021   IR US GUIDE VASC ACCESS RIGHT  10/13/2021   IRRIGATION AND DEBRIDEMENT ABSCESS N/A 10/04/2021   Procedure: IRRIGATION AND DEBRIDEMENT NECK ABSCESS;  Surgeon: Axel Filler, MD;  Location[facility] OR;  Service: General;  Laterality: N/A;    Family History  Problem Relation Age of Onset   Alcohol abuse Paternal Grandfather    Huntington's disease Maternal Grandfather    Cancer Father        pancreatic   Diabetes Mother        Type II DM   Colon cancer Neg Hx    Colon polyps Neg Hx     Allergies as of 03/17/2023   (No Known Allergies)    Social History   Socioeconomic History   Marital status: Single    Spouse name: Not on file   Number of children: 0   Years of education: Not on file   Highest education level: High school graduate  Occupational History   Occupation: unemployed  Tobacco Use   Smoking status: Never    Passive exposure: Never   Smokeless tobacco: Never  Vaping Use   Vaping status: Never Used  Substance and Sexual Activity   Alcohol use: No   Drug use: No   Sexual activity: Not Currently    Birth control/protection: Abstinence, None  Other Topics Concern   Not on file  Social History Narrative   Not on file   Social Determinants of Health   Financial Resource Strain: Low Risk  (01/14/2023)   Overall Financial Resource Strain (CARDIA)    Difficulty of Paying Living Expenses: Not hard at all  Food Insecurity: No Food Insecurity (01/14/2023)   Hunger Vital Sign    Worried About Running Out of Food in the Last Year: Never true    Ran Out of Food in the Last Year: Never true  Transportation Needs: No Transportation Needs (01/14/2023)   PRAPARE - Administrator, Civil Service (Medical): No    Lack of Transportation (Non-Medical): No  Physical Activity: Insufficiently Active (01/14/2023)   Exercise Vital Sign    Days of Exercise per Week: 4 days    Minutes of Exercise per Session: 30 min  Stress: No Stress Concern  Present (01/14/2023)   [employer] of Occupational Health - Occupational Stress Questionnaire    Feeling of Stress : Only a little  Social Connections: Moderately Isolated (01/14/2023)  Social Advertising account executive [NHANES]    Frequency of Communication with Friends and Family: More than three times a week    Frequency of Social Gatherings with Friends and Family: More than three times a week    Attends Religious Services: More than 4 times per year    Active Member of [employer] or Organizations: No    Attends Banker Meetings: Never    Marital Status: Never married     Review of Systems   Gen: Denies fever, chills, anorexia. Denies fatigue, weakness, weight loss.  CV: Denies chest pain, palpitations, syncope, peripheral edema, and claudication. Resp: Denies dyspnea at rest, cough, wheezing, coughing up blood, and pleurisy. GI: See HPI Derm: Denies rash, itching, dry skin Psych: Denies depression, anxiety, memory loss, confusion. No homicidal or suicidal ideation.  Heme: Denies bruising, bleeding, and enlarged lymph nodes.  Physical Exam   There were no vitals taken for this visit.  General:   Alert and oriented. No distress noted. Pleasant and cooperative.  Head:  Normocephalic and atraumatic. Eyes:  Conjuctiva clear without scleral icterus. Mouth:  Oral mucosa pink and moist. Good dentition. No lesions. Lungs:  Clear to auscultation bilaterally. No wheezes, rales, or rhonchi. No distress.  Heart:  S1, S2 present without murmurs appreciated.  Abdomen:  +BS, soft, non-tender and non-distended. No rebound or guarding. No HSM or masses noted. Rectal: *** Msk:  Symmetrical without gross deformities. Normal posture. Extremities:  Without edema. Neurologic:  Alert and  oriented x4 Psych:  Alert and cooperative. Normal mood and affect.  Assessment  Lindsey Murillo is a 27 y.o. female with a history of chronic pancreatitis with stable pseudocyst, gastritis,  ESRD on dialysis M, W, F and undergoing transplant workup, asthma, type 1 diabetes, HTN, cataracts presenting today for follow-up. ***  Chronic pancreatitis, diarrhea:  GERD, dysphagia, nausea/vomiting:  IDA:  Elevated alkaline phosphatase:  PLAN   ***     Brooke Bonito, MSN, FNP-BC, AGACNP-BC [facility]

## 2023-03-17 ENCOUNTER — Ambulatory Visit: Payer: Medicaid Other | Admitting: Gastroenterology

## 2023-03-17 NOTE — Telephone Encounter (Signed)
Patient was called and made aware. 

## 2023-03-18 DIAGNOSIS — E1022 Type 1 diabetes mellitus with diabetic chronic kidney disease: Secondary | ICD-10-CM | POA: Diagnosis not present

## 2023-03-18 DIAGNOSIS — D631 Anemia in chronic kidney disease: Secondary | ICD-10-CM | POA: Diagnosis not present

## 2023-03-18 DIAGNOSIS — N186 End stage renal disease: Secondary | ICD-10-CM | POA: Diagnosis not present

## 2023-03-18 DIAGNOSIS — Z992 Dependence on renal dialysis: Secondary | ICD-10-CM | POA: Diagnosis not present

## 2023-03-18 DIAGNOSIS — E039 Hypothyroidism, unspecified: Secondary | ICD-10-CM | POA: Diagnosis not present

## 2023-03-18 DIAGNOSIS — N2581 Secondary hyperparathyroidism of renal origin: Secondary | ICD-10-CM | POA: Diagnosis not present

## 2023-03-21 DIAGNOSIS — N2581 Secondary hyperparathyroidism of renal origin: Secondary | ICD-10-CM | POA: Diagnosis not present

## 2023-03-21 DIAGNOSIS — Z992 Dependence on renal dialysis: Secondary | ICD-10-CM | POA: Diagnosis not present

## 2023-03-21 DIAGNOSIS — N186 End stage renal disease: Secondary | ICD-10-CM | POA: Diagnosis not present

## 2023-03-21 DIAGNOSIS — E1022 Type 1 diabetes mellitus with diabetic chronic kidney disease: Secondary | ICD-10-CM | POA: Diagnosis not present

## 2023-03-21 DIAGNOSIS — D631 Anemia in chronic kidney disease: Secondary | ICD-10-CM | POA: Diagnosis not present

## 2023-03-21 DIAGNOSIS — E039 Hypothyroidism, unspecified: Secondary | ICD-10-CM | POA: Diagnosis not present

## 2023-03-21 NOTE — Progress Notes (Unsigned)
GI Office Note    Referring Provider: [name], DO Primary Care Physician:  [name], DO Primary Gastroenterologist: Hennie Duos. Lindsey Lor, DO  Date:  03/22/2023  ID:  [name], DOB 10-12-1995, MRN [id number]   Chief Complaint   Chief Complaint  Patient presents with   Follow-up    Follow up. No problems    History of Present Illness  Lindsey Murillo is a 27 y.o. female with a history of chronic pancreatitis with stable pseudocyst, gastritis, ESRD on dialysis M, W, F and undergoing transplant workup, asthma, type 1 diabetes, HTN, cataracts presenting today for follow-up.    EGD 03/08/2022: -Gastritis s/p biopsy -Normal duodenum -Biopsy negative for H. pylori, slight chronic inflammation. -Use pantoprazole 40 mg once daily -Avoid NSAIDs.   Colonoscopy 02/09/2022: -Nonbleeding internal hemorrhoids -Exam otherwise normal -Repeat colonoscopy at age 57 or sooner   ED visit 05/10/2022 for diarrhea. Presenting with complaint of diarrhea and nausea.  Was given prescription for Zofran and Cipro.  GI pathogen panel negative.   ED visit 05/27/2022 for nausea/vomiting and back pain. EKG with sinus tach and left atrial enlargement.  Low voltage.  Reportedly missed dialysis session due to dizziness.  Also with low blood sugar at this time which is likely contributing to her nausea as well.  Treated with IV fluids and dextrose.   ED visit 06/12/2022 for weakness and vomiting. Was given GI cocktail and prescription for Zofran to use 4 mg every 8 hours as needed.  Also given 1 L fluids.  EKG with sinus rhythm and left atrial enlargement with incomplete right bundle branch block.  Prolonged QT in precordial leads.  No significant changes since prior EKG on 06/02/2022.   Office visit 06/17/22. Breakthrough GERD  twice per week. Occasional nausea. No dysphagia. Occasional looser stools. Increase PPI to BID, zofran as needed. Renal and GERD diet advised. CBC in 2 months.   Last  office visit 09/16/22.  Denies any abdominal pain but did report some mild nausea.  Reflux has been much more controlled with twice daily PPI.  Still having fatigue, especially on dialysis days.  Has good appetite.  Denies any constipation, sometimes diarrhea 1-2 times per week.  Receives iron intravenously at times with dialysis.  Continuing to undergo workup for kidney transplant.  Having upcoming cataract surgery.  Advised to continue with Zofran 40 mg twice daily, Zofran as needed.  Follow GERD diet and check CBC with next blood draw.   Labs 02/14/2023: Hemoglobin 12.4, platelets 174, creatinine 9.29, albumin 3.3, alk phos 255 (down from 266 in September 2024 and 315 in July 2024)   Today:   Diarrhea: intermittent. Not as much now. Maybe twice a week, unsure if it is related to foods or certain food choices. Unsure when she stopped pancreatic enzymes. Doe snot use any antidiarrheal. Does not drink milk, can eat cheese okay without issue.   GERD, dysphagia: Sometimes has some acid reflux. Taking pantoprazole but not sure if she is taking it at all. Does not use a pill organizer. No dysphagia. When she wakes up in the morning but gets better the longer she is awake. Sometimes has some upper abdominal discomfort.   IDA: Getting iron at dialysis. Checking it regularly. Continues to see transplant people.  Still having issues with her eye and the cataract. Dialysis has ben going okay, M, w, F.    Still taking Vitamin D  Wt Readings from Last 3 Encounters:  03/22/23 121 lb 3.2  oz (55 kg)  03/15/23 113 lb (51.3 kg)  02/21/23 120 lb 8 oz (54.7 kg)  02/02/23         123 lb  Current Outpatient Medications  Medication Sig Dispense Refill   acetaminophen (TYLENOL) 325 MG tablet Take by mouth.     cetirizine HCl (ZYRTEC) 1 MG/ML solution Take 1.25 ml twice a day as needed for a runny nose or itch 118 mL 2   Continuous Glucose Receiver (DEXCOM G7 RECEIVER) DEVI by Does not apply route.     Continuous  Glucose Sensor (DEXCOM G7 SENSOR) MISC by Does not apply route.     fluticasone (FLONASE) 50 MCG/ACT nasal spray Place 2 sprays into both nostrils daily as needed for allergies or rhinitis. 16 g 5   Fluticasone Furoate (ARNUITY ELLIPTA) 100 MCG/ACT AEPB Inhale 1 Dose into the lungs daily. 30 each 5   insulin degludec (TRESIBA FLEXTOUCH) 100 UNIT/ML FlexTouch Pen Inject 15 Units into the skin at bedtime. 15 mL 3   medroxyPROGESTERone (PROVERA) 10 MG tablet Take provera 10 mg for 10 days every 3 months 30 tablet 2   ondansetron (ZOFRAN-ODT) 4 MG disintegrating tablet 4mg  ODT q4 hours prn nausea/vomit 10 tablet 0   oxyCODONE (OXY IR/ROXICODONE) 5 MG immediate release tablet      pantoprazole (PROTONIX) 40 MG tablet Take 1 tablet (40 mg total) by mouth 2 (two) times daily before a meal. 180 tablet 3   rosuvastatin (CRESTOR) 10 MG tablet Take 1 tablet (10 mg total) by mouth daily. 90 tablet 3   scopolamine (TRANSDERM-SCOP) 1 MG/3DAYS Place 1 patch (1.5 mg total) onto the skin every 3 (three) days. 10 patch 12   silver sulfADIAZINE (SILVADENE) 1 % cream Apply 1 Application topically daily. Apply to wound daily and cover with gauze dressing 50 g 0   sucroferric oxyhydroxide (VELPHORO) 500 MG chewable tablet Chew 500 mg by mouth 3 (three) times daily with meals.     Vitamin D, Ergocalciferol, (DRISDOL) 1.25 MG (50000 UNIT) CAPS capsule Take 50,000 Units by mouth every 7 (seven) days.     Blood Glucose Monitoring Suppl (ACCU-CHEK GUIDE ME) w/Device KIT 1 Piece by Does not apply route as directed. (Patient not taking: Reported on 03/22/2023) 1 kit 0   glucose blood (ACCU-CHEK GUIDE) test strip Use to monitor glucose 4 times a day as instructed (Patient not taking: Reported on 03/22/2023) 150 each 2   insulin aspart (FIASP FLEXTOUCH) 100 UNIT/ML FlexTouch Pen Inject 5-8 Units into the skin 3 (three) times daily before meals. (Patient not taking: Reported on 03/22/2023) 15 mL 3   Insulin Pen Needle 31G X 4 MM  MISC Use as directed to administer insulin 4 times daily. (Patient not taking: Reported on 03/22/2023) 300 each 3   No current facility-administered medications for this visit.    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 10/21/2021   Diabetic autonomic neuropathy ([name])    Diabetic peripheral neuropathy ([name])    Environmental allergies    Goiter    History of blood transfusion    Hypertension    no meds currently   Hypoglycemia associated with diabetes ([name])    Musculoskeletal pain 04/20/2022   Tachycardia    Thyroiditis, autoimmune    Type 1  diabetes mellitus in patient age 76-19 years with HbA1C goal below 7.5     Past Surgical History:  Procedure Laterality Date   AV FISTULA PLACEMENT Right 12/08/2021   Procedure: ARTERIOVENOUS  FISTULA CREATION VERSUS GRAFT;  Surgeon: Larina Earthly, MD;  Location: [facility];  Service: Vascular;  Laterality: Right;   BASCILIC VEIN TRANSPOSITION Right 01/12/2022   Procedure: RIGHT ARM SECOND STAGE BASILIC VEIN TRANSPOSITION;  Surgeon: Larina Earthly, MD;  Location: [facility];  Service: Vascular;  Laterality: Right;   BIOPSY  08/27/2016   Procedure: BIOPSY;  Surgeon: West Bali, MD;  Location: [facility];  Service: Endoscopy;;  duodenum; gastric   BIOPSY  06/27/2019   Procedure: BIOPSY;  Surgeon: Corbin Ade, MD;  Location: [facility];  Service: Endoscopy;;   BIOPSY  02/09/2022   Procedure: BIOPSY;  Surgeon: Lanelle Bal, DO;  Location: [facility];  Service: Endoscopy;;   BREAST CYST EXCISION Right 11/12/2021   Procedure: RIGHT BREAST ABSCESS INCISION AND DRAINAGE;  Surgeon: Emelia Loron, MD;  Location: [facility] OR;  Service: General;  Laterality: Right;  LMA   COLONOSCOPY     COLONOSCOPY WITH  PROPOFOL N/A 02/09/2022   Procedure: COLONOSCOPY WITH PROPOFOL;  Surgeon: Lanelle Bal, DO;  Location: [facility];  Service: Endoscopy;  Laterality: N/A;  7:30am, asa 3, dialysis pt   ESOPHAGOGASTRODUODENOSCOPY N/A 08/27/2016   Dr. Darrick Penna: mild gastritis. Negative celiac. No obvious source for dyspepsia/diarrhea   ESOPHAGOGASTRODUODENOSCOPY (EGD) WITH PROPOFOL N/A 06/27/2019   rourk: Focal abnormality of the gastric mucosa likely due to trauma (heaving).  Biopsy showed mild gastritis, negative for H. pylori.  Esophageal dilation for history of dysphagia but normal-appearing esophagus.   ESOPHAGOGASTRODUODENOSCOPY (EGD) WITH PROPOFOL N/A 02/09/2022   Procedure: ESOPHAGOGASTRODUODENOSCOPY (EGD) WITH PROPOFOL;  Surgeon: Lanelle Bal, DO;  [facility];  Service: Endoscopy;  Laterality: N/A;   IR FLUORO GUIDE CV LINE RIGHT  10/13/2021   IR FLUORO GUIDE CV LINE RIGHT  10/19/2021   IR US GUIDE VASC ACCESS RIGHT  10/13/2021   IRRIGATION AND DEBRIDEMENT ABSCESS N/A 10/04/2021   Procedure: IRRIGATION AND DEBRIDEMENT NECK ABSCESS;  Surgeon: Axel Filler, MD;  Location: [facility] OR;  Service: General;  Laterality: N/A;    Family History  Problem Relation Age of Onset   Alcohol abuse Paternal Grandfather    Huntington's disease Maternal Grandfather    Cancer Father        pancreatic   Diabetes Mother        Type II DM   Colon cancer Neg Hx    Colon polyps Neg Hx     Allergies as of 03/22/2023   (No Known Allergies)    Social History   Socioeconomic History   Marital status: Single    Spouse name: Not on file   Number of children: 0   Years of education: Not on file   Highest education level: High school graduate  Occupational History   Occupation: unemployed  Tobacco Use   Smoking status: Never    Passive exposure: Never   Smokeless tobacco: Never  Vaping Use   Vaping status: Never Used  Substance and Sexual Activity   Alcohol use: No   Drug use: No   Sexual  activity: Not Currently    Birth control/protection: Abstinence, None  Other Topics Concern   Not on file  Social History Narrative   Not on file   Social Determinants of Corporate investment banker  Strain: Low Risk  (01/14/2023)   Overall Financial Resource Strain (CARDIA)    Difficulty of Paying Living Expenses: Not hard at all  Food Insecurity: No Food Insecurity (01/14/2023)   Hunger Vital Sign    Worried About Running Out of Food in the Last Year: Never true    Ran Out of Food in the Last Year: Never true  Transportation Needs: No Transportation Needs (01/14/2023)   PRAPARE - Administrator, Civil Service (Medical): No    Lack of Transportation (Non-Medical): No  Physical Activity: Insufficiently Active (01/14/2023)   Exercise Vital Sign    Days of Exercise per Week: 4 days    Minutes of Exercise per Session: 30 min  Stress: No Stress Concern Present (01/14/2023)   [employer] of Occupational Health - Occupational Stress Questionnaire    Feeling of Stress : Only a little  Social Connections: Moderately Isolated (01/14/2023)   Social Connection and Isolation Panel [NHANES]    Frequency of Communication with Friends and Family: More than three times a week    Frequency of Social Gatherings with Friends and Family: More than three times a week    Attends Religious Services: More than 4 times per year    Active Member of [employer] or Organizations: No    Attends Banker Meetings: Never    Marital Status: Never married     Review of Systems   Gen: Denies fever, chills, anorexia. Denies fatigue, weakness, weight loss.  CV: Denies chest pain, palpitations, syncope, peripheral edema, and claudication. Resp: Denies dyspnea at rest, cough, wheezing, coughing up blood, and pleurisy. GI: See HPI Derm: Denies rash, itching, dry skin Psych: Denies depression, anxiety, memory loss, confusion. No homicidal or suicidal ideation.  Heme: Denies bruising, bleeding, and  enlarged lymph nodes.  Physical Exam   BP (!) 142/89 (BP Location: Left Arm, Patient Position: Sitting, Cuff Size: Normal)   Pulse 100   Temp 98 F (36.7 C) (Temporal)   Ht 5\' 9"  (1.753 m)   Wt 121 lb 3.2 oz (55 kg)   LMP 02/22/2023 (Approximate)   BMI 17.90 kg/m   General:   Alert and oriented. No distress noted. Pleasant and cooperative.  Head:  Normocephalic and atraumatic. Eyes: Sclera red to right eye, mild pink coloring to left eye. Mouth:  Oral mucosa pink and moist. Good dentition. No lesions. Lungs:  Clear to auscultation bilaterally. No wheezes, rales, or rhonchi. No distress.  Heart:  S1, S2 present without murmurs appreciated.  Abdomen:  +BS, soft, non-tender and non-distended. No rebound or guarding. No HSM or masses noted. Rectal: deferred Msk: Weak, mild unsteady gait.  Uses single pole cane. Extremities:  Without edema. Neurologic:  Alert and  oriented x4 Psych:  Alert and cooperative. Normal mood and affect.  Assessment  Lindsey Murillo is a 27 y.o. female with a history of chronic pancreatitis with stable pseudocyst, gastritis, ESRD on dialysis M, W, F and undergoing transplant workup, asthma, type 1 diabetes, HTN, cataracts presenting today for follow-up.    Chronic pancreatitis, diarrhea: Currently without any weight loss.  Not taking any pancreatic enzymes.  Does have some occasional looser stools a couple times per week, usually will have multiple episodes that day but otherwise stools normal and semiformed.  Continue to advise that if she begins having any weight loss or worsening diarrhea that we should consider resuming pancreatic enzymes.  Recent CT imaging in July with ongoing evidence of parenchymal calcifications consistent with chronic pancreatitis.  GERD, dysphagia, nausea/vomiting: Continues to report some mild intermittent morning nausea but continues to have a good appetite.  Currently unclear if she is taking pantoprazole twice daily as  previously recommended.  Recommended use of a pill organizer to ensure she is taking her medications adequately and as directed.  She has to check her medications at home to see if she is taking pantoprazole twice daily and if not she is to resume.  If she is taking PPI twice daily, her reflux symptoms are likely dietary related.  Does have a history of gastritis on EGD performed last year.    IDA: Likely of chronic disease.  Continues to experience fatigue after dialysis but this is baseline.  Continues to deny any melena or BRBPR.  Most recent hemoglobin stable at 12.4, receives iron at dialysis as needed.  EGD and colonoscopy up-to-date.   Elevated alkaline phosphatase: Reviewed recent blood work and she had elevated alkaline phosphatase in September of this year with recent blood work in October with slight decrease from 266 to 255.  Ultrasound in July with normal CBD, distended gallbladder without shadowing stones.  CT also in July with unremarkable liver and gallbladder and no biliary ductal dilation.  Etiology of alkaline phosphatase remains unclear at this time.  Will check GGT, alk phos isoenzymes and PBC panel given the elevation.  Has some occasional pruritus, unsure if related.  PLAN   Alk phos isoenzymes. PBC panel, GgT fasting - [facility] Continue pantoprazole 40 mg BID, refilled today. Discussed importance of pill organizer today. Zofran as needed. Continue Vitamin D Advised to follow-up if having recurrent/more frequent diarrhea Continue iron as needed at dialysis Follow up in 6 months.     Brooke Bonito, MSN, FNP-BC, AGACNP-BC [facility]

## 2023-03-22 ENCOUNTER — Ambulatory Visit (INDEPENDENT_AMBULATORY_CARE_PROVIDER_SITE_OTHER): Payer: 59 | Admitting: Gastroenterology

## 2023-03-22 ENCOUNTER — Encounter: Payer: Self-pay | Admitting: Gastroenterology

## 2023-03-22 VITALS — BP 142/89 | HR 100 | Temp 98.0°F | Ht 69.0 in | Wt 121.2 lb

## 2023-03-22 DIAGNOSIS — R748 Abnormal levels of other serum enzymes: Secondary | ICD-10-CM

## 2023-03-22 DIAGNOSIS — Z8719 Personal history of other diseases of the digestive system: Secondary | ICD-10-CM | POA: Diagnosis not present

## 2023-03-22 DIAGNOSIS — K861 Other chronic pancreatitis: Secondary | ICD-10-CM | POA: Diagnosis not present

## 2023-03-22 DIAGNOSIS — D509 Iron deficiency anemia, unspecified: Secondary | ICD-10-CM | POA: Diagnosis not present

## 2023-03-22 DIAGNOSIS — R112 Nausea with vomiting, unspecified: Secondary | ICD-10-CM

## 2023-03-22 DIAGNOSIS — K219 Gastro-esophageal reflux disease without esophagitis: Secondary | ICD-10-CM

## 2023-03-22 DIAGNOSIS — R1319 Other dysphagia: Secondary | ICD-10-CM

## 2023-03-22 MED ORDER — PANTOPRAZOLE SODIUM 40 MG PO TBEC: 40.0000 mg | DELAYED_RELEASE_TABLET | Freq: Two times a day (BID) | ORAL | 3 refills | Status: AC

## 2023-03-22 NOTE — Patient Instructions (Addendum)
Please look at your medications at home and let me know if you are indeed taking pantoprazole 40 mg twice daily.  If you have not been taking it want you to resume taking it.  As we discussed I would recommend that you pick up a pill organizer that has a morning and afternoon section and once a week fill this up with all of your regular daily medications to ensure you are taking everything adequately.  If diarrhea becomes more consistent or more frequent please let me know as at that point I would recommend resuming your pancreatic enzymes.  You may continue to use Zofran as needed and please continue taking your daily vitamin D.  Please have blood work completed at [facility].  We will call you with results once they have been received. Please allow 3-5 business days for review. 2 locations for [facility] in [place]:              1. [address], [place]              2. [address] [place]   The labs that I want you to to have performed are to check for etiology of your elevated alkaline phosphatase which is one of your liver enzymes.  It is important that you have these labs done on empty stomach, first thing in the morning.  It was a pleasure to see you today. I want to create trusting relationships with patients. If you receive a survey regarding your visit,  I greatly appreciate you taking time to fill this out on paper or through your MyChart. I value your feedback.  Brooke Bonito, MSN, FNP-BC, AGACNP-BC [facility]

## 2023-03-23 DIAGNOSIS — E039 Hypothyroidism, unspecified: Secondary | ICD-10-CM | POA: Diagnosis not present

## 2023-03-23 DIAGNOSIS — E1022 Type 1 diabetes mellitus with diabetic chronic kidney disease: Secondary | ICD-10-CM | POA: Diagnosis not present

## 2023-03-23 DIAGNOSIS — D631 Anemia in chronic kidney disease: Secondary | ICD-10-CM | POA: Diagnosis not present

## 2023-03-23 DIAGNOSIS — Z992 Dependence on renal dialysis: Secondary | ICD-10-CM | POA: Diagnosis not present

## 2023-03-23 DIAGNOSIS — N186 End stage renal disease: Secondary | ICD-10-CM | POA: Diagnosis not present

## 2023-03-23 DIAGNOSIS — N2581 Secondary hyperparathyroidism of renal origin: Secondary | ICD-10-CM | POA: Diagnosis not present

## 2023-03-24 ENCOUNTER — Encounter: Payer: Self-pay | Admitting: Allergy

## 2023-03-24 ENCOUNTER — Ambulatory Visit (INDEPENDENT_AMBULATORY_CARE_PROVIDER_SITE_OTHER): Payer: 59 | Admitting: Allergy

## 2023-03-24 ENCOUNTER — Other Ambulatory Visit: Payer: Self-pay

## 2023-03-24 VITALS — BP 108/78 | HR 102 | Temp 98.2°F | Resp 18 | Ht 69.0 in | Wt 121.0 lb

## 2023-03-24 DIAGNOSIS — J4540 Moderate persistent asthma, uncomplicated: Secondary | ICD-10-CM

## 2023-03-24 DIAGNOSIS — J3089 Other allergic rhinitis: Secondary | ICD-10-CM | POA: Diagnosis not present

## 2023-03-24 DIAGNOSIS — H1013 Acute atopic conjunctivitis, bilateral: Secondary | ICD-10-CM | POA: Diagnosis not present

## 2023-03-24 MED ORDER — RYALTRIS 665-25 MCG/ACT NA SUSP: NASAL | 2 refills | Status: DC

## 2023-03-24 MED ORDER — VENTOLIN HFA 108 (90 BASE) MCG/ACT IN AERS: 2.0000 | INHALATION_SPRAY | RESPIRATORY_TRACT | 1 refills | Status: AC | PRN

## 2023-03-24 MED ORDER — OLOPATADINE HCL 0.2 % OP SOLN: OPHTHALMIC | 5 refills | Status: DC

## 2023-03-24 MED ORDER — ARNUITY ELLIPTA 100 MCG/ACT IN AEPB: INHALATION_SPRAY | RESPIRATORY_TRACT | 5 refills | Status: DC

## 2023-03-24 MED ORDER — CETIRIZINE HCL 1 MG/ML PO SOLN: 1.2500 mg | Freq: Two times a day (BID) | ORAL | 2 refills | Status: DC

## 2023-03-24 NOTE — Progress Notes (Signed)
Follow-up Note  RE: Lindsey Murillo MRN: [id number] DOB: 28-Dec-1995 Date of Office Visit: 03/24/2023   History of present illness: Lindsey Murillo is a 27 y.o. female presenting today for follow-up of asthma and allergic rhinitis with conjunctivitis.  She was last seen in the office on 12/23/2022 by myself. She presents today with her mother.   There is confusion regarding her current inhaler regimen. She reports using a purple inhaler but is unsure of its name or purpose. She uses this inhaler irregularly, only when experiencing symptoms, which occur approximately twice a week. The effectiveness of this inhaler is inconsistent, providing relief only sometimes.  I discussed with her that the only inhaler I am aware of that exposure was Advair and this has never been prescribed to her by myself.  She is supposed to have Arnuity inhaler which is an Ellipta inhaler that is orange and white she states she has had this inhaler before but she is not currently using this 1.    The patient also has a history of allergies, experiencing symptoms such as runny nose, stuffy nose, sneezing, and itchy, watery eyes. She has been taking cetirizine, initially in pill form but switched to liquid due to drowsiness. The liquid form is taken once a day at 1.25 mg dosing and seems to help control her allergy symptoms to a degree.  The patient also mentions a cough syrup, but it is unclear what this medication is for or how often it is used. She denies any recent hospitalizations or urgent care visits due to breathing issues fortunately.     Review of systems: 10pt ROS negative unless noted above in HPI   All other systems negative unless noted above in HPI  Past medical/social/surgical/family history have been reviewed and are unchanged unless specifically indicated below.  No changes  Medication List: Current Outpatient Medications  Medication Sig Dispense Refill   acetaminophen (TYLENOL) 325 MG  tablet Take by mouth.     Continuous Glucose Receiver (DEXCOM G7 RECEIVER) DEVI by Does not apply route.     Continuous Glucose Sensor (DEXCOM G7 SENSOR) MISC by Does not apply route.     fluticasone (FLONASE) 50 MCG/ACT nasal spray Place 2 sprays into both nostrils daily as needed for allergies or rhinitis. 16 g 5   medroxyPROGESTERone (PROVERA) 10 MG tablet Take provera 10 mg for 10 days every 3 months 30 tablet 2   Olopatadine HCl (PATADAY) 0.2 % SOLN 1 drop in each eye 1(ONE) Time per day as needed for red, itchy eyes. 2.5 mL 5   Olopatadine-Mometasone (RYALTRIS) 665-25 MCG/ACT SUSP 2 sprays each nostril 2(TWO) Times a day as needed for runny or stuffy nose. 29 g 2   ondansetron (ZOFRAN-ODT) 4 MG disintegrating tablet 4mg  ODT q4 hours prn nausea/vomit 10 tablet 0   oxyCODONE (OXY IR/ROXICODONE) 5 MG immediate release tablet      pantoprazole (PROTONIX) 40 MG tablet Take 1 tablet (40 mg total) by mouth 2 (two) times daily before a meal. 180 tablet 3   rosuvastatin (CRESTOR) 10 MG tablet Take 1 tablet (10 mg total) by mouth daily. 90 tablet 3   scopolamine (TRANSDERM-SCOP) 1 MG/3DAYS Place 1 patch (1.5 mg total) onto the skin every 3 (three) days. 10 patch 12   silver sulfADIAZINE (SILVADENE) 1 % cream Apply 1 Application topically daily. Apply to wound daily and cover with gauze dressing 50 g 0   sucroferric oxyhydroxide (VELPHORO) 500 MG chewable tablet Chew 500 mg by  mouth 3 (three) times daily with meals.     VENTOLIN HFA 108 (90 Base) MCG/ACT inhaler Inhale 2 puffs into the lungs every 4 (four) hours as needed for wheezing or shortness of breath. 2 puffs once every 4 hours as needed for cough, wheeze, shortness of breath and chest tightness. May use 2 puffs 5 to 15 minutes before activity to decrease cough or wheeze. 18 g 1   Vitamin D, Ergocalciferol, (DRISDOL) 1.25 MG (50000 UNIT) CAPS capsule Take 50,000 Units by mouth every 7 (seven) days.     cetirizine HCl (ZYRTEC) 1 MG/ML solution Take  1.25mg  2 times daily. 473 mL 2   Fluticasone Furoate (ARNUITY ELLIPTA) 100 MCG/ACT AEPB 1 puff 1(ONE) Time per day to prevent cough or wheeze. 30 each 5   No current facility-administered medications for this visit.     Known medication allergies: No Known Allergies   Physical examination: Blood pressure 108/78, pulse (!) 102, temperature 98.2 F (36.8 C), temperature source Temporal, resp. rate 18, height 5\' 9"  (1.753 m), weight 121 lb (54.9 kg), last menstrual period 02/22/2023, SpO2 100%.  General: Alert, interactive, in no acute distress. HEENT: PERRLA, TMs pearly gray, turbinates moderately edematous without discharge, post-pharynx non erythematous. Neck: Supple without lymphadenopathy. Lungs: Mildly decreased breath sounds bilaterally without wheezing, rhonchi or rales. {no increased work of breathing. CV: Normal S1, S2 without murmurs. Abdomen: Nondistended, nontender. Skin: Warm and dry, without lesions or rashes. Extremities:  No clubbing, cyanosis or edema. Neuro:   Grossly intact.  Diagnositics/Labs: Spirometry: FEV1: 2.06L 63%, FVC: 2.38L 62%, ratio consistent with restrictive pattern  Assessment and plan:   Asthma Lung function testing is best it has looked!  Still shows shows evidence of obstruction.   Use Arnuity 100 (orange top)-1 puff once a day to prevent cough or wheeze.  This looks to be preferred with insurance.   Use albuterol 2 puffs once every 4 hours as needed for cough or wheeze.  You may use albuterol 2 puffs 5 to 15 minutes before activity to decrease cough or wheeze Asthma control goals:  Full participation in all desired activities (may need albuterol before activity) Albuterol use two time or less a week on average (not counting use with activity) Cough interfering with sleep two time or less a month Oral steroids no more than once a year No hospitalizations   Allergic rhinitis Continue allergen avoidance measures directed toward dust mite and  cockroach as listed below Cetirizine 1.25 mg liquid twice a day. Ryaltris nasal spray 2 sprays each nostril twice a day as needed for runny or stuffy nose.  With using nasal sprays point tip of bottle toward eye on same side nostril and lean head slightly forward for best technique.   Consider saline nasal rinses as needed for nasal symptoms. Use this before any medicated nasal sprays for best result  Allergic conjunctivitis Some over the counter eye drops include Pataday one drop in each eye once a day as needed for red, itchy eyes OR Zaditor one drop in each eye twice a day as needed for red itchy eyes. Avoid eye drops that say red eye relief as they may contain medications that dry out your eyes.   Call the clinic and let me know the name of the purple inhaler to see if this is something you should continue to use  Follow up in 3-4 months or sooner if needed.   I appreciate the opportunity to take part in Yarelie's care. Please do not  hesitate to contact me with questions.  Sincerely,   Margo Aye, MD Allergy/Immunology [facility]

## 2023-03-24 NOTE — Patient Instructions (Addendum)
Asthma Lung function testing is best it has looked!  Still shows shows evidence of obstruction.   Use Arnuity 100 (orange top)-1 puff once a day to prevent cough or wheeze.  This looks to be preferred with insurance.   Use albuterol 2 puffs once every 4 hours as needed for cough or wheeze.  You may use albuterol 2 puffs 5 to 15 minutes before activity to decrease cough or wheeze Asthma control goals:  Full participation in all desired activities (may need albuterol before activity) Albuterol use two time or less a week on average (not counting use with activity) Cough interfering with sleep two time or less a month Oral steroids no more than once a year No hospitalizations   Allergic rhinitis Continue allergen avoidance measures directed toward dust mite and cockroach as listed below Cetirizine 1.25 mg liquid twice a day. Ryaltris nasal spray 2 sprays each nostril twice a day as needed for runny or stuffy nose.  With using nasal sprays point tip of bottle toward eye on same side nostril and lean head slightly forward for best technique.   Consider saline nasal rinses as needed for nasal symptoms. Use this before any medicated nasal sprays for best result  Allergic conjunctivitis Some over the counter eye drops include Pataday one drop in each eye once a day as needed for red, itchy eyes OR Zaditor one drop in each eye twice a day as needed for red itchy eyes. Avoid eye drops that say red eye relief as they may contain medications that dry out your eyes.   Call the clinic and let me know the name of the purple inhaler to see if this is something you should continue to use  Follow up in 3-4 months or sooner if needed.

## 2023-03-25 ENCOUNTER — Other Ambulatory Visit: Payer: Self-pay | Admitting: *Deleted

## 2023-03-25 DIAGNOSIS — N2581 Secondary hyperparathyroidism of renal origin: Secondary | ICD-10-CM | POA: Diagnosis not present

## 2023-03-25 DIAGNOSIS — N186 End stage renal disease: Secondary | ICD-10-CM | POA: Diagnosis not present

## 2023-03-25 DIAGNOSIS — E1022 Type 1 diabetes mellitus with diabetic chronic kidney disease: Secondary | ICD-10-CM | POA: Diagnosis not present

## 2023-03-25 DIAGNOSIS — Z992 Dependence on renal dialysis: Secondary | ICD-10-CM | POA: Diagnosis not present

## 2023-03-25 DIAGNOSIS — E039 Hypothyroidism, unspecified: Secondary | ICD-10-CM | POA: Diagnosis not present

## 2023-03-25 DIAGNOSIS — D631 Anemia in chronic kidney disease: Secondary | ICD-10-CM | POA: Diagnosis not present

## 2023-03-25 MED ORDER — CETIRIZINE HCL 1 MG/ML PO SOLN: ORAL | 2 refills | Status: DC

## 2023-03-28 DIAGNOSIS — E1022 Type 1 diabetes mellitus with diabetic chronic kidney disease: Secondary | ICD-10-CM | POA: Diagnosis not present

## 2023-03-28 DIAGNOSIS — E039 Hypothyroidism, unspecified: Secondary | ICD-10-CM | POA: Diagnosis not present

## 2023-03-28 DIAGNOSIS — Z992 Dependence on renal dialysis: Secondary | ICD-10-CM | POA: Diagnosis not present

## 2023-03-28 DIAGNOSIS — D631 Anemia in chronic kidney disease: Secondary | ICD-10-CM | POA: Diagnosis not present

## 2023-03-28 DIAGNOSIS — N2581 Secondary hyperparathyroidism of renal origin: Secondary | ICD-10-CM | POA: Diagnosis not present

## 2023-03-28 DIAGNOSIS — N186 End stage renal disease: Secondary | ICD-10-CM | POA: Diagnosis not present

## 2023-03-29 ENCOUNTER — Encounter: Payer: Self-pay | Admitting: Podiatry

## 2023-03-29 ENCOUNTER — Ambulatory Visit (INDEPENDENT_AMBULATORY_CARE_PROVIDER_SITE_OTHER): Payer: 59 | Admitting: Podiatry

## 2023-03-29 DIAGNOSIS — E10621 Type 1 diabetes mellitus with foot ulcer: Secondary | ICD-10-CM | POA: Diagnosis not present

## 2023-03-29 NOTE — Progress Notes (Unsigned)
Chief Complaint  Patient presents with   Routine Post Op    PATIENT STATES HER FEET HAS BEEN OK    HPI: 27 y.o. female presents today for follow-up of right submet 1 ulceration.  She has been wearing her offloading surgical shoe.  She is hoping she can return to regular shoe gear today.  Denies any recent drainage.  Notes she has not gotten around to getting the custom orthotics from [facility].  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 10/21/2021   Diabetic autonomic neuropathy ([name])    Diabetic peripheral neuropathy ([name])    Environmental allergies    Goiter    History of blood transfusion    Hypertension    no meds currently   Hypoglycemia associated with diabetes ([name])    Musculoskeletal pain 04/20/2022   Tachycardia    Thyroiditis, autoimmune    Type 1 diabetes mellitus in patient age 76-19 years with HbA1C goal below 7.5     Past Surgical History:  Procedure Laterality Date   AV FISTULA PLACEMENT Right 12/08/2021   Procedure: ARTERIOVENOUS  FISTULA CREATION VERSUS GRAFT;  Surgeon: Larina Earthly, MD;  Location: [facility];  Service: Vascular;  Laterality: Right;   BASCILIC VEIN TRANSPOSITION Right 01/12/2022   Procedure: RIGHT ARM SECOND STAGE BASILIC VEIN TRANSPOSITION;  Surgeon: Larina Earthly, MD;  Location: [facility];  Service: Vascular;  Laterality: Right;   BIOPSY  08/27/2016   Procedure: BIOPSY;  Surgeon: West Bali, MD;  Location: [facility];  Service: Endoscopy;;  duodenum; gastric   BIOPSY  06/27/2019   Procedure: BIOPSY;  Surgeon: Corbin Ade, MD;  Location: [facility];  Service: Endoscopy;;   BIOPSY  02/09/2022   Procedure: BIOPSY;  Surgeon: Lanelle Bal, DO;  [facility];  Service: Endoscopy;;    BREAST CYST EXCISION Right 11/12/2021   Procedure: RIGHT BREAST ABSCESS INCISION AND DRAINAGE;  Surgeon: Emelia Loron, MD;  Location: [facility] OR;  Service: General;  Laterality: Right;  LMA   COLONOSCOPY     COLONOSCOPY WITH PROPOFOL N/A 02/09/2022   Procedure: COLONOSCOPY WITH PROPOFOL;  Surgeon: Lanelle Bal, DO;  [facility];  Service: Endoscopy;  Laterality: N/A;  7:30am, asa 3, dialysis pt   ESOPHAGOGASTRODUODENOSCOPY N/A 08/27/2016   Dr. Darrick Penna: mild gastritis. Negative celiac. No obvious source for dyspepsia/diarrhea   ESOPHAGOGASTRODUODENOSCOPY (EGD) WITH PROPOFOL N/A 06/27/2019   rourk: Focal abnormality of the gastric mucosa likely due to trauma (heaving).  Biopsy showed mild gastritis, negative for H. pylori.  Esophageal dilation for history of dysphagia but normal-appearing esophagus.   ESOPHAGOGASTRODUODENOSCOPY (EGD) WITH PROPOFOL N/A 02/09/2022   Procedure: ESOPHAGOGASTRODUODENOSCOPY (EGD) WITH PROPOFOL;  Surgeon: Lanelle Bal, DO;  [facility];  Service: Endoscopy;  Laterality: N/A;   IR FLUORO GUIDE CV LINE RIGHT  10/13/2021   IR FLUORO GUIDE CV LINE RIGHT  10/19/2021   IR US GUIDE VASC ACCESS RIGHT  10/13/2021   IRRIGATION AND DEBRIDEMENT ABSCESS N/A 10/04/2021   Procedure: IRRIGATION AND DEBRIDEMENT NECK ABSCESS;  Surgeon: Axel Filler, MD;  Location: [facility] OR;  Service: General;  Laterality: N/A;   No Known Allergies   Physical Exam: On exam the patient has palpable pedal  pulses on the right foot.  The area of the ulceration right submet 1 has minimal hemorrhagic callus noted.  There is no pain on palpation to the area.  No active drainage is seen.  Upon debridement of the area and shaving of the hyperkeratotic skin there are no openings present today.  No surrounding erythema is noted  Assessment/Plan of Care: 1. Type 1 diabetes mellitus with foot ulcer (CODE) ([name])    Discussed clinical findings with patient today.  The felt offloading pad of  the shoe she will be transferring to today was replaced with a new thicker pads.  Encourage patient to get her custom orthotics as soon as possible.  She noted immediate improvement following shaving of the hyperkeratotic lesion with a sterile #[id number] blade.  Follow-up in 2 months for a recheck for her preulcerative callus.  Clerance Lav, DPM, FACFAS [facility]     [address] [place], [place] [address]                Office [phone]  Fax [phone]

## 2023-03-30 DIAGNOSIS — E039 Hypothyroidism, unspecified: Secondary | ICD-10-CM | POA: Diagnosis not present

## 2023-03-30 DIAGNOSIS — N186 End stage renal disease: Secondary | ICD-10-CM | POA: Diagnosis not present

## 2023-03-30 DIAGNOSIS — N2581 Secondary hyperparathyroidism of renal origin: Secondary | ICD-10-CM | POA: Diagnosis not present

## 2023-03-30 DIAGNOSIS — Z992 Dependence on renal dialysis: Secondary | ICD-10-CM | POA: Diagnosis not present

## 2023-03-30 DIAGNOSIS — E1022 Type 1 diabetes mellitus with diabetic chronic kidney disease: Secondary | ICD-10-CM | POA: Diagnosis not present

## 2023-03-30 DIAGNOSIS — D631 Anemia in chronic kidney disease: Secondary | ICD-10-CM | POA: Diagnosis not present

## 2023-04-01 ENCOUNTER — Other Ambulatory Visit: Payer: Self-pay | Admitting: Podiatry

## 2023-04-01 DIAGNOSIS — N2581 Secondary hyperparathyroidism of renal origin: Secondary | ICD-10-CM | POA: Diagnosis not present

## 2023-04-01 DIAGNOSIS — E1022 Type 1 diabetes mellitus with diabetic chronic kidney disease: Secondary | ICD-10-CM | POA: Diagnosis not present

## 2023-04-01 DIAGNOSIS — E039 Hypothyroidism, unspecified: Secondary | ICD-10-CM | POA: Diagnosis not present

## 2023-04-01 DIAGNOSIS — N186 End stage renal disease: Secondary | ICD-10-CM | POA: Diagnosis not present

## 2023-04-01 DIAGNOSIS — Z992 Dependence on renal dialysis: Secondary | ICD-10-CM | POA: Diagnosis not present

## 2023-04-01 DIAGNOSIS — D631 Anemia in chronic kidney disease: Secondary | ICD-10-CM | POA: Diagnosis not present

## 2023-04-03 DIAGNOSIS — D631 Anemia in chronic kidney disease: Secondary | ICD-10-CM | POA: Diagnosis not present

## 2023-04-03 DIAGNOSIS — Z992 Dependence on renal dialysis: Secondary | ICD-10-CM | POA: Diagnosis not present

## 2023-04-03 DIAGNOSIS — E1022 Type 1 diabetes mellitus with diabetic chronic kidney disease: Secondary | ICD-10-CM | POA: Diagnosis not present

## 2023-04-03 DIAGNOSIS — E039 Hypothyroidism, unspecified: Secondary | ICD-10-CM | POA: Diagnosis not present

## 2023-04-03 DIAGNOSIS — N186 End stage renal disease: Secondary | ICD-10-CM | POA: Diagnosis not present

## 2023-04-03 DIAGNOSIS — N2581 Secondary hyperparathyroidism of renal origin: Secondary | ICD-10-CM | POA: Diagnosis not present

## 2023-04-05 DIAGNOSIS — Z992 Dependence on renal dialysis: Secondary | ICD-10-CM | POA: Diagnosis not present

## 2023-04-05 DIAGNOSIS — D631 Anemia in chronic kidney disease: Secondary | ICD-10-CM | POA: Diagnosis not present

## 2023-04-05 DIAGNOSIS — N186 End stage renal disease: Secondary | ICD-10-CM | POA: Diagnosis not present

## 2023-04-05 DIAGNOSIS — E039 Hypothyroidism, unspecified: Secondary | ICD-10-CM | POA: Diagnosis not present

## 2023-04-05 DIAGNOSIS — E1022 Type 1 diabetes mellitus with diabetic chronic kidney disease: Secondary | ICD-10-CM | POA: Diagnosis not present

## 2023-04-05 DIAGNOSIS — N2581 Secondary hyperparathyroidism of renal origin: Secondary | ICD-10-CM | POA: Diagnosis not present

## 2023-04-08 DIAGNOSIS — Z992 Dependence on renal dialysis: Secondary | ICD-10-CM | POA: Diagnosis not present

## 2023-04-08 DIAGNOSIS — E1022 Type 1 diabetes mellitus with diabetic chronic kidney disease: Secondary | ICD-10-CM | POA: Diagnosis not present

## 2023-04-08 DIAGNOSIS — N186 End stage renal disease: Secondary | ICD-10-CM | POA: Diagnosis not present

## 2023-04-08 DIAGNOSIS — N2581 Secondary hyperparathyroidism of renal origin: Secondary | ICD-10-CM | POA: Diagnosis not present

## 2023-04-08 DIAGNOSIS — E039 Hypothyroidism, unspecified: Secondary | ICD-10-CM | POA: Diagnosis not present

## 2023-04-08 DIAGNOSIS — D631 Anemia in chronic kidney disease: Secondary | ICD-10-CM | POA: Diagnosis not present

## 2023-04-09 DIAGNOSIS — N186 End stage renal disease: Secondary | ICD-10-CM | POA: Diagnosis not present

## 2023-04-09 DIAGNOSIS — N032 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: Secondary | ICD-10-CM | POA: Diagnosis not present

## 2023-04-09 DIAGNOSIS — Z992 Dependence on renal dialysis: Secondary | ICD-10-CM | POA: Diagnosis not present

## 2023-04-11 DIAGNOSIS — N186 End stage renal disease: Secondary | ICD-10-CM | POA: Diagnosis not present

## 2023-04-11 DIAGNOSIS — N2581 Secondary hyperparathyroidism of renal origin: Secondary | ICD-10-CM | POA: Diagnosis not present

## 2023-04-11 DIAGNOSIS — D509 Iron deficiency anemia, unspecified: Secondary | ICD-10-CM | POA: Diagnosis not present

## 2023-04-11 DIAGNOSIS — Z992 Dependence on renal dialysis: Secondary | ICD-10-CM | POA: Diagnosis not present

## 2023-04-11 DIAGNOSIS — E1040 Type 1 diabetes mellitus with diabetic neuropathy, unspecified: Secondary | ICD-10-CM | POA: Diagnosis not present

## 2023-04-11 DIAGNOSIS — E1022 Type 1 diabetes mellitus with diabetic chronic kidney disease: Secondary | ICD-10-CM | POA: Diagnosis not present

## 2023-04-11 DIAGNOSIS — E1043 Type 1 diabetes mellitus with diabetic autonomic (poly)neuropathy: Secondary | ICD-10-CM | POA: Diagnosis not present

## 2023-04-13 DIAGNOSIS — N186 End stage renal disease: Secondary | ICD-10-CM | POA: Diagnosis not present

## 2023-04-13 DIAGNOSIS — N2581 Secondary hyperparathyroidism of renal origin: Secondary | ICD-10-CM | POA: Diagnosis not present

## 2023-04-13 DIAGNOSIS — D509 Iron deficiency anemia, unspecified: Secondary | ICD-10-CM | POA: Diagnosis not present

## 2023-04-13 DIAGNOSIS — E1040 Type 1 diabetes mellitus with diabetic neuropathy, unspecified: Secondary | ICD-10-CM | POA: Diagnosis not present

## 2023-04-13 DIAGNOSIS — Z992 Dependence on renal dialysis: Secondary | ICD-10-CM | POA: Diagnosis not present

## 2023-04-13 DIAGNOSIS — E1043 Type 1 diabetes mellitus with diabetic autonomic (poly)neuropathy: Secondary | ICD-10-CM | POA: Diagnosis not present

## 2023-04-13 DIAGNOSIS — E1022 Type 1 diabetes mellitus with diabetic chronic kidney disease: Secondary | ICD-10-CM | POA: Diagnosis not present

## 2023-04-14 ENCOUNTER — Other Ambulatory Visit: Payer: Self-pay

## 2023-04-14 ENCOUNTER — Emergency Department (HOSPITAL_COMMUNITY)
Admission: EM | Admit: 2023-04-14 | Discharge: 2023-04-15 | Disposition: A | Discharge status: Home / Self Care | Payer: 59 | Attending: Emergency Medicine | Admitting: Emergency Medicine

## 2023-04-14 ENCOUNTER — Encounter (HOSPITAL_COMMUNITY): Payer: Self-pay

## 2023-04-14 DIAGNOSIS — D72829 Elevated white blood cell count, unspecified: Secondary | ICD-10-CM | POA: Insufficient documentation

## 2023-04-14 DIAGNOSIS — N189 Chronic kidney disease, unspecified: Secondary | ICD-10-CM | POA: Diagnosis not present

## 2023-04-14 DIAGNOSIS — E1022 Type 1 diabetes mellitus with diabetic chronic kidney disease: Secondary | ICD-10-CM | POA: Diagnosis not present

## 2023-04-14 DIAGNOSIS — Z992 Dependence on renal dialysis: Secondary | ICD-10-CM | POA: Insufficient documentation

## 2023-04-14 DIAGNOSIS — Z79899 Other long term (current) drug therapy: Secondary | ICD-10-CM | POA: Insufficient documentation

## 2023-04-14 DIAGNOSIS — I959 Hypotension, unspecified: Secondary | ICD-10-CM | POA: Diagnosis not present

## 2023-04-14 LAB — CBC WITH DIFFERENTIAL/PLATELET
Abs Immature Granulocytes: 0.04 10*3/uL (ref 0.00–0.07)
Basophils Absolute: 0.1 10*3/uL (ref 0.0–0.1)
Basophils Relative: 1 %
Eosinophils Absolute: 0.5 10*3/uL (ref 0.0–0.5)
Eosinophils Relative: 4 %
HCT: 40.3 % (ref 36.0–46.0)
Hemoglobin: 12.7 g/dL (ref 12.0–15.0)
Immature Granulocytes: 0 %
Lymphocytes Relative: 25 %
Lymphs Abs: 3 10*3/uL (ref 0.7–4.0)
MCH: 27.9 pg (ref 26.0–34.0)
MCHC: 31.5 g/dL (ref 30.0–36.0)
MCV: 88.4 fL (ref 80.0–100.0)
Monocytes Absolute: 0.7 10*3/uL (ref 0.1–1.0)
Monocytes Relative: 6 %
Neutro Abs: 7.9 10*3/uL — ABNORMAL HIGH (ref 1.7–7.7)
Neutrophils Relative %: 64 %
Platelets: 143 10*3/uL — ABNORMAL LOW (ref 150–400)
RBC: 4.56 MIL/uL (ref 3.87–5.11)
RDW: 18.6 % — ABNORMAL HIGH (ref 11.5–15.5)
WBC: 12.3 10*3/uL — ABNORMAL HIGH (ref 4.0–10.5)
nRBC: 0 % (ref 0.0–0.2)

## 2023-04-14 LAB — COMPREHENSIVE METABOLIC PANEL
ALT: 54 U/L — ABNORMAL HIGH (ref 0–44)
AST: 34 U/L (ref 15–41)
Albumin: 3.7 g/dL (ref 3.5–5.0)
Alkaline Phosphatase: 205 U/L — ABNORMAL HIGH (ref 38–126)
Anion gap: 18 — ABNORMAL HIGH (ref 5–15)
BUN: 38 mg/dL — ABNORMAL HIGH (ref 6–20)
CO2: 26 mmol/L (ref 22–32)
Calcium: 9.5 mg/dL (ref 8.9–10.3)
Chloride: 93 mmol/L — ABNORMAL LOW (ref 98–111)
Creatinine, Ser: 5.58 mg/dL — ABNORMAL HIGH (ref 0.44–1.00)
GFR, Estimated: 10 mL/min — ABNORMAL LOW (ref >60)
Glucose, Bld: 96 mg/dL (ref 70–99)
Potassium: 4.2 mmol/L (ref 3.5–5.1)
Sodium: 137 mmol/L (ref 135–145)
Total Bilirubin: 0.7 mg/dL (ref <1.2)
Total Protein: 7.8 g/dL (ref 6.5–8.1)

## 2023-04-14 LAB — LIPASE, BLOOD: Lipase: 43 U/L (ref 11–51)

## 2023-04-14 NOTE — ED Triage Notes (Signed)
MWF Dialysis pt Hypotension yesterday after dialysis  Vomiting started after dialysis tx and hypotension

## 2023-04-15 NOTE — Discharge Instructions (Signed)
Follow-up with your dialysis tomorrow as scheduled and return to the ER if symptoms significantly worsen or change.

## 2023-04-15 NOTE — ED Provider Notes (Signed)
[place] EMERGENCY DEPARTMENT AT [facility] Provider Note   CSN: [id number] Arrival date & time: 04/14/23  2102     History  Chief Complaint  Patient presents with   Hypotension    Lindsey Murillo is a 27 y.o. female.  Patient is a 27 year old female with past medical history of type 1 diabetes, hypertension, and end-stage renal disease on hemodialysis.  Patient presenting today with complaints of low blood pressure.  She did her normal dialysis yesterday, then seem to have a low blood pressure afterward and has persisted into today.  She denies any symptoms otherwise.  No fevers or chills.  No abdominal pain, vomiting, or diarrhea.  The history is provided by the patient.       Home Medications Prior to Admission medications   Medication Sig Start Date End Date Taking? Authorizing Provider  acetaminophen (TYLENOL) 325 MG tablet Take by mouth. 12/24/21   [provider]  cetirizine HCl (ZYRTEC) 1 MG/ML solution Take 1.25mg  2 times daily. 03/25/23   Marcelyn Bruins, MD  Continuous Glucose Receiver (DEXCOM G7 RECEIVER) DEVI by Does not apply route.    [provider]  Continuous Glucose Sensor (DEXCOM G7 SENSOR) MISC by Does not apply route.    [provider]  fluticasone (FLONASE) 50 MCG/ACT nasal spray Place 2 sprays into both nostrils daily as needed for allergies or rhinitis. 12/23/22   Hetty Blend, FNP  Fluticasone Furoate (ARNUITY ELLIPTA) 100 MCG/ACT AEPB 1 puff 1(ONE) Time per day to prevent cough or wheeze. 03/24/23   Marcelyn Bruins, MD  medroxyPROGESTERone (PROVERA) 10 MG tablet Take provera 10 mg for 10 days every 3 months 11/16/22   Cyril Mourning A, NP  Olopatadine HCl (PATADAY) 0.2 % SOLN 1 drop in each eye 1(ONE) Time per day as needed for red, itchy eyes. 03/24/23   Marcelyn Bruins, MD  Olopatadine-Mometasone Cristal Generous) [phone] MCG/ACT SUSP 2 sprays each nostril 2(TWO) Times a day as needed for  runny or stuffy nose. 03/24/23   Marcelyn Bruins, MD  ondansetron (ZOFRAN-ODT) 4 MG disintegrating tablet 4mg  ODT q4 hours prn nausea/vomit 12/01/22   Linwood Dibbles, MD  oxyCODONE (OXY IR/ROXICODONE) 5 MG immediate release tablet  10/22/21   [provider]  pantoprazole (PROTONIX) 40 MG tablet Take 1 tablet (40 mg total) by mouth 2 (two) times daily before a meal. 03/22/23 03/16/24  Aida Raider, NP  rosuvastatin (CRESTOR) 10 MG tablet Take 1 tablet (10 mg total) by mouth daily. 03/15/23   [name], DO  scopolamine (TRANSDERM-SCOP) 1 MG/3DAYS Place 1 patch (1.5 mg total) onto the skin every 3 (three) days. 02/02/23   Roemhildt, Lorin T, PA-C  SSD 1 % cream APPLY CREAM TO WOUND DAILY AND COVER WITH GAUZE DRESSING 04/01/23   McCaughan, Dia D, DPM  sucroferric oxyhydroxide (VELPHORO) 500 MG chewable tablet Chew 500 mg by mouth 3 (three) times daily with meals. 11/16/21   [provider]  VENTOLIN HFA 108 (90 Base) MCG/ACT inhaler Inhale 2 puffs into the lungs every 4 (four) hours as needed for wheezing or shortness of breath. 2 puffs once every 4 hours as needed for cough, wheeze, shortness of breath and chest tightness. May use 2 puffs 5 to 15 minutes before activity to decrease cough or wheeze. 03/24/23   Marcelyn Bruins, MD  Vitamin D, Ergocalciferol, (DRISDOL) 1.25 MG (50000 UNIT) CAPS capsule Take 50,000 Units by mouth every 7 (seven) days.    [provider]      Allergies    Patient has no known allergies.    Review of Systems   Review of Systems  All other systems reviewed and are negative.   Physical Exam Updated Vital Signs BP 115/82   Pulse 92   Temp 98.9 F (37.2 C) (Oral)   Resp 14   Ht 5\' 7"  (1.702 m)   Wt 54.9 kg   LMP 02/22/2023 (Approximate)   SpO2 94%   BMI 18.95 kg/m  Physical Exam Vitals and nursing note reviewed.  Constitutional:      General: She is not in acute distress.    Appearance: She is well-developed. She  is not diaphoretic.  HENT:     Head: Normocephalic and atraumatic.  Cardiovascular:     Rate and Rhythm: Normal rate and regular rhythm.     Heart sounds: No murmur heard.    No friction rub. No gallop.  Pulmonary:     Effort: Pulmonary effort is normal. No respiratory distress.     Breath sounds: Normal breath sounds. No wheezing.  Abdominal:     General: Bowel sounds are normal. There is no distension.     Palpations: Abdomen is soft.     Tenderness: There is no abdominal tenderness.  Musculoskeletal:        General: Normal range of motion.     Cervical back: Normal range of motion and neck supple.  Skin:    General: Skin is warm and dry.  Neurological:     General: No focal deficit present.     Mental Status: She is alert and oriented to person, place, and time.     ED Results / Procedures / Treatments   Labs (all labs ordered are listed, but only abnormal results are displayed) Labs Reviewed  CBC WITH DIFFERENTIAL/PLATELET - Abnormal; Notable for the following components:      Result Value   WBC 12.3 (*)    RDW 18.6 (*)    Platelets 143 (*)    Neutro Abs 7.9 (*)    All other components within normal limits  COMPREHENSIVE METABOLIC PANEL - Abnormal; Notable for the following components:   Chloride 93 (*)    BUN 38 (*)    Creatinine, Ser 5.58 (*)    ALT 54 (*)    Alkaline Phosphatase 205 (*)    GFR, Estimated 10 (*)    Anion gap 18 (*)    All other components within normal limits  LIPASE, BLOOD  URINALYSIS, ROUTINE W REFLEX MICROSCOPIC    EKG None  Radiology No results found.  Procedures Procedures    Medications Ordered in ED Medications - No data to display  ED Course/ Medical Decision Making/ A&P  Patient with history of type 1 diabetes and end-stage renal disease on hemodialysis presenting with low blood pressure as described in the HPI.  Patient arrives here with stable vital signs.  Upon arrival, her blood pressure was 118/80 and has been in the  120s and 130s systolic since.  Physical examination otherwise unremarkable.  Benign abdomen.  Workup initiated including CBC and basic metabolic panel, both of which are basically unremarkable.  She has a slight leukocytosis with white count of 12.3, but no other significant findings.  I am uncertain as to the etiology of this.  At this point, I feel as though patient can be discharged.  She has no significant complaints and her vitals here are stable.  Patient to be discharged with dialysis follow-up  tomorrow.  Final Clinical Impression(s) / ED Diagnoses Final diagnoses:  None    Rx / DC Orders ED Discharge Orders     None         Geoffery Lyons, MD 04/15/23 [phone]

## 2023-04-16 DIAGNOSIS — E1022 Type 1 diabetes mellitus with diabetic chronic kidney disease: Secondary | ICD-10-CM | POA: Diagnosis not present

## 2023-04-16 DIAGNOSIS — D509 Iron deficiency anemia, unspecified: Secondary | ICD-10-CM | POA: Diagnosis not present

## 2023-04-16 DIAGNOSIS — E1040 Type 1 diabetes mellitus with diabetic neuropathy, unspecified: Secondary | ICD-10-CM | POA: Diagnosis not present

## 2023-04-16 DIAGNOSIS — N2581 Secondary hyperparathyroidism of renal origin: Secondary | ICD-10-CM | POA: Diagnosis not present

## 2023-04-16 DIAGNOSIS — N186 End stage renal disease: Secondary | ICD-10-CM | POA: Diagnosis not present

## 2023-04-16 DIAGNOSIS — E1043 Type 1 diabetes mellitus with diabetic autonomic (poly)neuropathy: Secondary | ICD-10-CM | POA: Diagnosis not present

## 2023-04-16 DIAGNOSIS — Z992 Dependence on renal dialysis: Secondary | ICD-10-CM | POA: Diagnosis not present

## 2023-04-18 DIAGNOSIS — N2581 Secondary hyperparathyroidism of renal origin: Secondary | ICD-10-CM | POA: Diagnosis not present

## 2023-04-18 DIAGNOSIS — E1040 Type 1 diabetes mellitus with diabetic neuropathy, unspecified: Secondary | ICD-10-CM | POA: Diagnosis not present

## 2023-04-18 DIAGNOSIS — Z992 Dependence on renal dialysis: Secondary | ICD-10-CM | POA: Diagnosis not present

## 2023-04-18 DIAGNOSIS — E1022 Type 1 diabetes mellitus with diabetic chronic kidney disease: Secondary | ICD-10-CM | POA: Diagnosis not present

## 2023-04-18 DIAGNOSIS — E1043 Type 1 diabetes mellitus with diabetic autonomic (poly)neuropathy: Secondary | ICD-10-CM | POA: Diagnosis not present

## 2023-04-18 DIAGNOSIS — N186 End stage renal disease: Secondary | ICD-10-CM | POA: Diagnosis not present

## 2023-04-18 DIAGNOSIS — D509 Iron deficiency anemia, unspecified: Secondary | ICD-10-CM | POA: Diagnosis not present

## 2023-04-20 DIAGNOSIS — Z992 Dependence on renal dialysis: Secondary | ICD-10-CM | POA: Diagnosis not present

## 2023-04-20 DIAGNOSIS — N2581 Secondary hyperparathyroidism of renal origin: Secondary | ICD-10-CM | POA: Diagnosis not present

## 2023-04-20 DIAGNOSIS — D509 Iron deficiency anemia, unspecified: Secondary | ICD-10-CM | POA: Diagnosis not present

## 2023-04-20 DIAGNOSIS — E1040 Type 1 diabetes mellitus with diabetic neuropathy, unspecified: Secondary | ICD-10-CM | POA: Diagnosis not present

## 2023-04-20 DIAGNOSIS — E1022 Type 1 diabetes mellitus with diabetic chronic kidney disease: Secondary | ICD-10-CM | POA: Diagnosis not present

## 2023-04-20 DIAGNOSIS — E1043 Type 1 diabetes mellitus with diabetic autonomic (poly)neuropathy: Secondary | ICD-10-CM | POA: Diagnosis not present

## 2023-04-20 DIAGNOSIS — N186 End stage renal disease: Secondary | ICD-10-CM | POA: Diagnosis not present

## 2023-04-21 ENCOUNTER — Other Ambulatory Visit (INDEPENDENT_AMBULATORY_CARE_PROVIDER_SITE_OTHER): Payer: 59

## 2023-04-21 ENCOUNTER — Ambulatory Visit: Payer: 59 | Admitting: Orthopaedic Surgery

## 2023-04-21 ENCOUNTER — Encounter: Payer: Self-pay | Admitting: Orthopaedic Surgery

## 2023-04-21 VITALS — BP 135/92 | HR 101 | Ht 68.0 in | Wt 116.0 lb

## 2023-04-21 DIAGNOSIS — M546 Pain in thoracic spine: Secondary | ICD-10-CM

## 2023-04-21 DIAGNOSIS — N186 End stage renal disease: Secondary | ICD-10-CM | POA: Diagnosis not present

## 2023-04-21 NOTE — Progress Notes (Signed)
Subjective:    Patient ID: Lindsey Murillo, female    DOB: [birth year], 27 y.o.   MRN: [id number]  HPI She has a long history of upper thoracic back pain.  It has been present six to eight years.  She has no trauma.  Her back hurts most of the time. She has no trauma. She has no weakness.  She has been on dialysis for about 18 months.  She has kidney failure from severe diabetes she states.  Her A1C is 7.4.  She takes an occasional Tylenol for pain.  She has used heat in the past with little relief.  She saw Dr. Romeo Apple for this several years ago.  She is tired of hurting.   Review of Systems  Respiratory:  Positive for shortness of breath.   Genitourinary:        ON dialysis  Musculoskeletal:  Positive for arthralgias and back pain.  For Review of Systems, all other systems reviewed and are negative.  The following is a summary of the past history medically, past history surgically, known current medicines, social history and family history.  This information is gathered electronically by the computer from prior information and documentation.  I review this each visit and have found including this information at this point in the chart is beneficial and informative.   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 10/21/2021   Diabetic autonomic neuropathy ([name])    Diabetic peripheral neuropathy ([name])    Environmental allergies    Goiter    History of blood transfusion    Hypertension    no meds currently   Hypoglycemia associated with diabetes ([name])    Musculoskeletal pain 04/20/2022   Tachycardia    Thyroiditis, autoimmune    Type 1 diabetes mellitus in patient age 47-19 years with HbA1C goal below 7.5     Past Surgical History:   Procedure Laterality Date   AV FISTULA PLACEMENT Right 12/08/2021   Procedure: ARTERIOVENOUS  FISTULA CREATION VERSUS GRAFT;  Surgeon: Larina Earthly, MD;  Location: [facility];  Service: Vascular;  Laterality: Right;   BASCILIC VEIN TRANSPOSITION Right 01/12/2022   Procedure: RIGHT ARM SECOND STAGE BASILIC VEIN TRANSPOSITION;  Surgeon: Larina Earthly, MD;  Location: [facility];  Service: Vascular;  Laterality: Right;   BIOPSY  08/27/2016   Procedure: BIOPSY;  Surgeon: West Bali, MD;  [facility];  Service: Endoscopy;;  duodenum; gastric   BIOPSY  06/27/2019   Procedure: BIOPSY;  Surgeon: Corbin Ade, MD;  [facility];  Service: Endoscopy;;   BIOPSY  02/09/2022   Procedure: BIOPSY;  Surgeon: Lanelle Bal, DO;  [facility];  Service: Endoscopy;;   BREAST CYST EXCISION Right 11/12/2021   Procedure: RIGHT BREAST ABSCESS INCISION AND DRAINAGE;  Surgeon: Emelia Loron, MD;  Location: [facility] OR;  Service: General;  Laterality: Right;  LMA   COLONOSCOPY     COLONOSCOPY WITH PROPOFOL N/A 02/09/2022   Procedure: COLONOSCOPY WITH PROPOFOL;  Surgeon: Lanelle Bal, DO;  [facility];  Service: Endoscopy;  Laterality: N/A;  7:30am, asa 3, dialysis pt   ESOPHAGOGASTRODUODENOSCOPY N/A 08/27/2016   Dr. Darrick Penna: mild gastritis. Negative celiac. No obvious source for dyspepsia/diarrhea   ESOPHAGOGASTRODUODENOSCOPY (  EGD) WITH PROPOFOL N/A 06/27/2019   rourk: Focal abnormality of the gastric mucosa likely due to trauma (heaving).  Biopsy showed mild gastritis, negative for H. pylori.  Esophageal dilation for history of dysphagia but normal-appearing esophagus.   ESOPHAGOGASTRODUODENOSCOPY (EGD) WITH PROPOFOL N/A 02/09/2022   Procedure: ESOPHAGOGASTRODUODENOSCOPY (EGD) WITH PROPOFOL;  Surgeon: Lanelle Bal, DO;  [facility];  Service: Endoscopy;  Laterality: N/A;   IR FLUORO GUIDE CV LINE RIGHT  10/13/2021   IR FLUORO GUIDE CV LINE RIGHT  10/19/2021   IR  US GUIDE VASC ACCESS RIGHT  10/13/2021   IRRIGATION AND DEBRIDEMENT ABSCESS N/A 10/04/2021   Procedure: IRRIGATION AND DEBRIDEMENT NECK ABSCESS;  Surgeon: Axel Filler, MD;  Location: [facility] OR;  Service: General;  Laterality: N/A;    Current Outpatient Medications on File Prior to Visit  Medication Sig Dispense Refill   acetaminophen (TYLENOL) 325 MG tablet Take by mouth.     cetirizine HCl (ZYRTEC) 1 MG/ML solution Take 1.25mg  2 times daily. 473 mL 2   Continuous Glucose Receiver (DEXCOM G7 RECEIVER) DEVI by Does not apply route.     Continuous Glucose Sensor (DEXCOM G7 SENSOR) MISC by Does not apply route.     fluticasone (FLONASE) 50 MCG/ACT nasal spray Place 2 sprays into both nostrils daily as needed for allergies or rhinitis. 16 g 5   Fluticasone Furoate (ARNUITY ELLIPTA) 100 MCG/ACT AEPB 1 puff 1(ONE) Time per day to prevent cough or wheeze. 30 each 5   medroxyPROGESTERone (PROVERA) 10 MG tablet Take provera 10 mg for 10 days every 3 months 30 tablet 2   Olopatadine HCl (PATADAY) 0.2 % SOLN 1 drop in each eye 1(ONE) Time per day as needed for red, itchy eyes. 2.5 mL 5   Olopatadine-Mometasone (RYALTRIS) 665-25 MCG/ACT SUSP 2 sprays each nostril 2(TWO) Times a day as needed for runny or stuffy nose. 29 g 2   ondansetron (ZOFRAN-ODT) 4 MG disintegrating tablet 4mg  ODT q4 hours prn nausea/vomit 10 tablet 0   oxyCODONE (OXY IR/ROXICODONE) 5 MG immediate release tablet      pantoprazole (PROTONIX) 40 MG tablet Take 1 tablet (40 mg total) by mouth 2 (two) times daily before a meal. 180 tablet 3   rosuvastatin (CRESTOR) 10 MG tablet Take 1 tablet (10 mg total) by mouth daily. 90 tablet 3   scopolamine (TRANSDERM-SCOP) 1 MG/3DAYS Place 1 patch (1.5 mg total) onto the skin every 3 (three) days. 10 patch 12   SSD 1 % cream APPLY CREAM TO WOUND DAILY AND COVER WITH GAUZE DRESSING 50 g 0   sucroferric oxyhydroxide (VELPHORO) 500 MG chewable tablet Chew 500 mg by mouth 3 (three) times daily with meals.      VENTOLIN HFA 108 (90 Base) MCG/ACT inhaler Inhale 2 puffs into the lungs every 4 (four) hours as needed for wheezing or shortness of breath. 2 puffs once every 4 hours as needed for cough, wheeze, shortness of breath and chest tightness. May use 2 puffs 5 to 15 minutes before activity to decrease cough or wheeze. 18 g 1   Vitamin D, Ergocalciferol, (DRISDOL) 1.25 MG (50000 UNIT) CAPS capsule Take 50,000 Units by mouth every 7 (seven) days.     No current facility-administered medications on file prior to visit.    Social History   Socioeconomic History   Marital status: Single    Spouse name: Not on file   Number of children: 0   Years of education: Not on file   Highest education  level: High school graduate  Occupational History   Occupation: unemployed  Tobacco Use   Smoking status: Never    Passive exposure: Never   Smokeless tobacco: Never  Vaping Use   Vaping status: Never Used  Substance and Sexual Activity   Alcohol use: No   Drug use: No   Sexual activity: Not Currently    Birth control/protection: Abstinence, None  Other Topics Concern   Not on file  Social History Narrative   Not on file   Social Drivers of Health   Financial Resource Strain: Low Risk  (01/14/2023)   Overall Financial Resource Strain (CARDIA)    Difficulty of Paying Living Expenses: Not hard at all  Food Insecurity: No Food Insecurity (01/14/2023)   Hunger Vital Sign    Worried About Running Out of Food in the Last Year: Never true    Ran Out of Food in the Last Year: Never true  Transportation Needs: No Transportation Needs (01/14/2023)   PRAPARE - Administrator, Civil Service (Medical): No    Lack of Transportation (Non-Medical): No  Physical Activity: Insufficiently Active (01/14/2023)   Exercise Vital Sign    Days of Exercise per Week: 4 days    Minutes of Exercise per Session: 30 min  Stress: No Stress Concern Present (01/14/2023)   [employer] of Occupational Health -  Occupational Stress Questionnaire    Feeling of Stress : Only a little  Social Connections: Moderately Isolated (01/14/2023)   Social Connection and Isolation Panel [NHANES]    Frequency of Communication with Friends and Family: More than three times a week    Frequency of Social Gatherings with Friends and Family: More than three times a week    Attends Religious Services: More than 4 times per year    Active Member of [employer] or Organizations: No    Attends Banker Meetings: Never    Marital Status: Never married  Intimate Partner Violence: Not At Risk (01/14/2023)   Humiliation, Afraid, Rape, and Kick questionnaire    Fear of Current or Ex-Partner: No    Emotionally Abused: No    Physically Abused: No    Sexually Abused: No    Family History  Problem Relation Age of Onset   Alcohol abuse Paternal Grandfather    Huntington's disease Maternal Grandfather    Cancer Father        pancreatic   Diabetes Mother        Type II DM   Colon cancer Neg Hx    Colon polyps Neg Hx     BP (!) 135/92   Pulse (!) 101   Ht 5\' 8"  (1.727 m)   Wt 116 lb (52.6 kg)   LMP 02/22/2023 (Approximate)   BMI 17.64 kg/m   Body mass index is 17.64 kg/m.      Objective:   Physical Exam Vitals and nursing note reviewed. Exam conducted with a chaperone present.  Constitutional:      Appearance: She is well-developed.  HENT:     Head: Normocephalic and atraumatic.  Eyes:     Conjunctiva/sclera: Conjunctivae normal.     Pupils: Pupils are equal, round, and reactive to light.  Cardiovascular:     Rate and Rhythm: Normal rate and regular rhythm.  Pulmonary:     Effort: Pulmonary effort is normal.  Abdominal:     Palpations: Abdomen is soft.  Musculoskeletal:       Arms:     Cervical back: Normal range of  motion and neck supple.  Skin:    General: Skin is warm and dry.  Neurological:     Mental Status: She is alert and oriented to person, place, and time.     Cranial Nerves: No  cranial nerve deficit.     Motor: No abnormal muscle tone.     Coordination: Coordination normal.     Deep Tendon Reflexes: Reflexes are normal and symmetric. Reflexes normal.  Psychiatric:        Behavior: Behavior normal.        Thought Content: Thought content normal.        Judgment: Judgment normal.   X-rays were done of the thoracic and lumbar spine, reported separately.        Assessment & Plan:   Encounter Diagnoses  Name Primary?   Pain in thoracic spine Yes   ESRD (end stage renal disease) ([name])    I will begin PT.  She cannot take NSAIDs.  I am looking for gradual improvement of pain.  Return in six weeks.  Call if any problem.  Precautions discussed.  Electronically Signed Darreld Mclean, MD 12/12/20248:42 AM

## 2023-04-22 DIAGNOSIS — N186 End stage renal disease: Secondary | ICD-10-CM | POA: Diagnosis not present

## 2023-04-22 DIAGNOSIS — E1043 Type 1 diabetes mellitus with diabetic autonomic (poly)neuropathy: Secondary | ICD-10-CM | POA: Diagnosis not present

## 2023-04-22 DIAGNOSIS — D509 Iron deficiency anemia, unspecified: Secondary | ICD-10-CM | POA: Diagnosis not present

## 2023-04-22 DIAGNOSIS — E1022 Type 1 diabetes mellitus with diabetic chronic kidney disease: Secondary | ICD-10-CM | POA: Diagnosis not present

## 2023-04-22 DIAGNOSIS — Z992 Dependence on renal dialysis: Secondary | ICD-10-CM | POA: Diagnosis not present

## 2023-04-22 DIAGNOSIS — N2581 Secondary hyperparathyroidism of renal origin: Secondary | ICD-10-CM | POA: Diagnosis not present

## 2023-04-22 DIAGNOSIS — E1040 Type 1 diabetes mellitus with diabetic neuropathy, unspecified: Secondary | ICD-10-CM | POA: Diagnosis not present

## 2023-04-25 DIAGNOSIS — E1043 Type 1 diabetes mellitus with diabetic autonomic (poly)neuropathy: Secondary | ICD-10-CM | POA: Diagnosis not present

## 2023-04-25 DIAGNOSIS — Z992 Dependence on renal dialysis: Secondary | ICD-10-CM | POA: Diagnosis not present

## 2023-04-25 DIAGNOSIS — N186 End stage renal disease: Secondary | ICD-10-CM | POA: Diagnosis not present

## 2023-04-25 DIAGNOSIS — D509 Iron deficiency anemia, unspecified: Secondary | ICD-10-CM | POA: Diagnosis not present

## 2023-04-25 DIAGNOSIS — N2581 Secondary hyperparathyroidism of renal origin: Secondary | ICD-10-CM | POA: Diagnosis not present

## 2023-04-25 DIAGNOSIS — E1040 Type 1 diabetes mellitus with diabetic neuropathy, unspecified: Secondary | ICD-10-CM | POA: Diagnosis not present

## 2023-04-25 DIAGNOSIS — E1022 Type 1 diabetes mellitus with diabetic chronic kidney disease: Secondary | ICD-10-CM | POA: Diagnosis not present

## 2023-04-27 DIAGNOSIS — E1043 Type 1 diabetes mellitus with diabetic autonomic (poly)neuropathy: Secondary | ICD-10-CM | POA: Diagnosis not present

## 2023-04-27 DIAGNOSIS — E1022 Type 1 diabetes mellitus with diabetic chronic kidney disease: Secondary | ICD-10-CM | POA: Diagnosis not present

## 2023-04-27 DIAGNOSIS — Z992 Dependence on renal dialysis: Secondary | ICD-10-CM | POA: Diagnosis not present

## 2023-04-27 DIAGNOSIS — D509 Iron deficiency anemia, unspecified: Secondary | ICD-10-CM | POA: Diagnosis not present

## 2023-04-27 DIAGNOSIS — N2581 Secondary hyperparathyroidism of renal origin: Secondary | ICD-10-CM | POA: Diagnosis not present

## 2023-04-27 DIAGNOSIS — E1040 Type 1 diabetes mellitus with diabetic neuropathy, unspecified: Secondary | ICD-10-CM | POA: Diagnosis not present

## 2023-04-27 DIAGNOSIS — N186 End stage renal disease: Secondary | ICD-10-CM | POA: Diagnosis not present

## 2023-04-28 DIAGNOSIS — I120 Hypertensive chronic kidney disease with stage 5 chronic kidney disease or end stage renal disease: Secondary | ICD-10-CM | POA: Diagnosis not present

## 2023-04-28 DIAGNOSIS — N186 End stage renal disease: Secondary | ICD-10-CM | POA: Diagnosis not present

## 2023-04-28 DIAGNOSIS — E119 Type 2 diabetes mellitus without complications: Secondary | ICD-10-CM | POA: Diagnosis not present

## 2023-04-29 DIAGNOSIS — N2581 Secondary hyperparathyroidism of renal origin: Secondary | ICD-10-CM | POA: Diagnosis not present

## 2023-04-29 DIAGNOSIS — E1040 Type 1 diabetes mellitus with diabetic neuropathy, unspecified: Secondary | ICD-10-CM | POA: Diagnosis not present

## 2023-04-29 DIAGNOSIS — N186 End stage renal disease: Secondary | ICD-10-CM | POA: Diagnosis not present

## 2023-04-29 DIAGNOSIS — Z992 Dependence on renal dialysis: Secondary | ICD-10-CM | POA: Diagnosis not present

## 2023-04-29 DIAGNOSIS — E1043 Type 1 diabetes mellitus with diabetic autonomic (poly)neuropathy: Secondary | ICD-10-CM | POA: Diagnosis not present

## 2023-04-29 DIAGNOSIS — E1022 Type 1 diabetes mellitus with diabetic chronic kidney disease: Secondary | ICD-10-CM | POA: Diagnosis not present

## 2023-04-29 DIAGNOSIS — D509 Iron deficiency anemia, unspecified: Secondary | ICD-10-CM | POA: Diagnosis not present

## 2023-05-01 DIAGNOSIS — Z992 Dependence on renal dialysis: Secondary | ICD-10-CM | POA: Diagnosis not present

## 2023-05-01 DIAGNOSIS — N2581 Secondary hyperparathyroidism of renal origin: Secondary | ICD-10-CM | POA: Diagnosis not present

## 2023-05-01 DIAGNOSIS — E1022 Type 1 diabetes mellitus with diabetic chronic kidney disease: Secondary | ICD-10-CM | POA: Diagnosis not present

## 2023-05-01 DIAGNOSIS — E1043 Type 1 diabetes mellitus with diabetic autonomic (poly)neuropathy: Secondary | ICD-10-CM | POA: Diagnosis not present

## 2023-05-01 DIAGNOSIS — N186 End stage renal disease: Secondary | ICD-10-CM | POA: Diagnosis not present

## 2023-05-01 DIAGNOSIS — E1040 Type 1 diabetes mellitus with diabetic neuropathy, unspecified: Secondary | ICD-10-CM | POA: Diagnosis not present

## 2023-05-01 DIAGNOSIS — D509 Iron deficiency anemia, unspecified: Secondary | ICD-10-CM | POA: Diagnosis not present

## 2023-05-03 DIAGNOSIS — Z992 Dependence on renal dialysis: Secondary | ICD-10-CM | POA: Diagnosis not present

## 2023-05-03 DIAGNOSIS — E1022 Type 1 diabetes mellitus with diabetic chronic kidney disease: Secondary | ICD-10-CM | POA: Diagnosis not present

## 2023-05-03 DIAGNOSIS — D509 Iron deficiency anemia, unspecified: Secondary | ICD-10-CM | POA: Diagnosis not present

## 2023-05-03 DIAGNOSIS — N186 End stage renal disease: Secondary | ICD-10-CM | POA: Diagnosis not present

## 2023-05-03 DIAGNOSIS — E1043 Type 1 diabetes mellitus with diabetic autonomic (poly)neuropathy: Secondary | ICD-10-CM | POA: Diagnosis not present

## 2023-05-03 DIAGNOSIS — N2581 Secondary hyperparathyroidism of renal origin: Secondary | ICD-10-CM | POA: Diagnosis not present

## 2023-05-03 DIAGNOSIS — E1040 Type 1 diabetes mellitus with diabetic neuropathy, unspecified: Secondary | ICD-10-CM | POA: Diagnosis not present

## 2023-05-06 ENCOUNTER — Other Ambulatory Visit: Payer: Self-pay | Admitting: Family Medicine

## 2023-05-06 DIAGNOSIS — E1043 Type 1 diabetes mellitus with diabetic autonomic (poly)neuropathy: Secondary | ICD-10-CM | POA: Diagnosis not present

## 2023-05-06 DIAGNOSIS — E1022 Type 1 diabetes mellitus with diabetic chronic kidney disease: Secondary | ICD-10-CM | POA: Diagnosis not present

## 2023-05-06 DIAGNOSIS — N2581 Secondary hyperparathyroidism of renal origin: Secondary | ICD-10-CM | POA: Diagnosis not present

## 2023-05-06 DIAGNOSIS — D509 Iron deficiency anemia, unspecified: Secondary | ICD-10-CM | POA: Diagnosis not present

## 2023-05-06 DIAGNOSIS — E1040 Type 1 diabetes mellitus with diabetic neuropathy, unspecified: Secondary | ICD-10-CM | POA: Diagnosis not present

## 2023-05-06 DIAGNOSIS — Z992 Dependence on renal dialysis: Secondary | ICD-10-CM | POA: Diagnosis not present

## 2023-05-06 DIAGNOSIS — N186 End stage renal disease: Secondary | ICD-10-CM | POA: Diagnosis not present

## 2023-05-08 DIAGNOSIS — Z992 Dependence on renal dialysis: Secondary | ICD-10-CM | POA: Diagnosis not present

## 2023-05-08 DIAGNOSIS — E1040 Type 1 diabetes mellitus with diabetic neuropathy, unspecified: Secondary | ICD-10-CM | POA: Diagnosis not present

## 2023-05-08 DIAGNOSIS — D509 Iron deficiency anemia, unspecified: Secondary | ICD-10-CM | POA: Diagnosis not present

## 2023-05-08 DIAGNOSIS — N186 End stage renal disease: Secondary | ICD-10-CM | POA: Diagnosis not present

## 2023-05-08 DIAGNOSIS — N2581 Secondary hyperparathyroidism of renal origin: Secondary | ICD-10-CM | POA: Diagnosis not present

## 2023-05-08 DIAGNOSIS — E1022 Type 1 diabetes mellitus with diabetic chronic kidney disease: Secondary | ICD-10-CM | POA: Diagnosis not present

## 2023-05-08 DIAGNOSIS — E1043 Type 1 diabetes mellitus with diabetic autonomic (poly)neuropathy: Secondary | ICD-10-CM | POA: Diagnosis not present

## 2023-05-09 DIAGNOSIS — H3341 Traction detachment of retina, right eye: Secondary | ICD-10-CM | POA: Diagnosis not present

## 2023-05-09 DIAGNOSIS — H353134 Nonexudative age-related macular degeneration, bilateral, advanced atrophic with subfoveal involvement: Secondary | ICD-10-CM | POA: Diagnosis not present

## 2023-05-09 DIAGNOSIS — H3342 Traction detachment of retina, left eye: Secondary | ICD-10-CM | POA: Diagnosis not present

## 2023-05-09 DIAGNOSIS — E103521 Type 1 diabetes mellitus with proliferative diabetic retinopathy with traction retinal detachment involving the macula, right eye: Secondary | ICD-10-CM | POA: Diagnosis not present

## 2023-05-09 LAB — HM DIABETES EYE EXAM

## 2023-05-10 ENCOUNTER — Ambulatory Visit: Payer: 59 | Admitting: Nurse Practitioner

## 2023-05-10 ENCOUNTER — Other Ambulatory Visit: Payer: Self-pay | Admitting: Nurse Practitioner

## 2023-05-10 ENCOUNTER — Telehealth: Payer: Self-pay | Admitting: Nurse Practitioner

## 2023-05-10 DIAGNOSIS — E1043 Type 1 diabetes mellitus with diabetic autonomic (poly)neuropathy: Secondary | ICD-10-CM | POA: Diagnosis not present

## 2023-05-10 DIAGNOSIS — E1022 Type 1 diabetes mellitus with diabetic chronic kidney disease: Secondary | ICD-10-CM | POA: Diagnosis not present

## 2023-05-10 DIAGNOSIS — N186 End stage renal disease: Secondary | ICD-10-CM | POA: Diagnosis not present

## 2023-05-10 DIAGNOSIS — E1040 Type 1 diabetes mellitus with diabetic neuropathy, unspecified: Secondary | ICD-10-CM | POA: Diagnosis not present

## 2023-05-10 DIAGNOSIS — N032 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: Secondary | ICD-10-CM | POA: Diagnosis not present

## 2023-05-10 DIAGNOSIS — N2581 Secondary hyperparathyroidism of renal origin: Secondary | ICD-10-CM | POA: Diagnosis not present

## 2023-05-10 DIAGNOSIS — D509 Iron deficiency anemia, unspecified: Secondary | ICD-10-CM | POA: Diagnosis not present

## 2023-05-10 DIAGNOSIS — Z992 Dependence on renal dialysis: Secondary | ICD-10-CM | POA: Diagnosis not present

## 2023-05-10 MED ORDER — INSULIN LISPRO (1 UNIT DIAL) 100 UNIT/ML (KWIKPEN): 5.0000 [IU] | PEN_INJECTOR | Freq: Three times a day (TID) | SUBCUTANEOUS | 3 refills | Status: DC

## 2023-05-10 NOTE — Telephone Encounter (Signed)
 I am not sure which pen she is needing to be replaced as both of them were Flextouch pens.  Can you call the pharmacy to inquire about what the patient needs?  If they are out of a certain medication, can they provide me the names of other alternatives they have in stock THAT WILL be covered by the patients insurance?

## 2023-05-10 NOTE — Telephone Encounter (Signed)
[facility] called and left a VM that there is a back order for her insulin (flextouch pen). Can something else be sent in for her?

## 2023-05-10 NOTE — Telephone Encounter (Signed)
Talked with [facility], they were wanting to know the insulins patient was on. Addressed with Whitney. She states that after this message,[facility] sent over a fax and she had addressed this.

## 2023-05-12 ENCOUNTER — Ambulatory Visit (HOSPITAL_COMMUNITY): Payer: 59 | Attending: Orthopaedic Surgery

## 2023-05-12 ENCOUNTER — Telehealth (HOSPITAL_COMMUNITY): Payer: Self-pay

## 2023-05-12 DIAGNOSIS — R2689 Other abnormalities of gait and mobility: Secondary | ICD-10-CM | POA: Insufficient documentation

## 2023-05-12 DIAGNOSIS — M6281 Muscle weakness (generalized): Secondary | ICD-10-CM | POA: Insufficient documentation

## 2023-05-12 DIAGNOSIS — M546 Pain in thoracic spine: Secondary | ICD-10-CM | POA: Insufficient documentation

## 2023-05-12 DIAGNOSIS — R262 Difficulty in walking, not elsewhere classified: Secondary | ICD-10-CM | POA: Insufficient documentation

## 2023-05-12 NOTE — Telephone Encounter (Signed)
 Called patient today to follow up with her appointment for her PT initial evaluation as she did not show up. PT left a voice message.  Vinie CROME. Ashlyne Olenick, PT, DPT, OCS Board-Certified Clinical Specialist in Orthopedic PT PT Compact Privilege # ([place]): [id number] T

## 2023-05-13 DIAGNOSIS — E1040 Type 1 diabetes mellitus with diabetic neuropathy, unspecified: Secondary | ICD-10-CM | POA: Diagnosis not present

## 2023-05-13 DIAGNOSIS — D631 Anemia in chronic kidney disease: Secondary | ICD-10-CM | POA: Diagnosis not present

## 2023-05-13 DIAGNOSIS — Z992 Dependence on renal dialysis: Secondary | ICD-10-CM | POA: Diagnosis not present

## 2023-05-13 DIAGNOSIS — N2581 Secondary hyperparathyroidism of renal origin: Secondary | ICD-10-CM | POA: Diagnosis not present

## 2023-05-13 DIAGNOSIS — R52 Pain, unspecified: Secondary | ICD-10-CM | POA: Diagnosis not present

## 2023-05-13 DIAGNOSIS — N186 End stage renal disease: Secondary | ICD-10-CM | POA: Diagnosis not present

## 2023-05-13 DIAGNOSIS — D509 Iron deficiency anemia, unspecified: Secondary | ICD-10-CM | POA: Diagnosis not present

## 2023-05-16 DIAGNOSIS — E1040 Type 1 diabetes mellitus with diabetic neuropathy, unspecified: Secondary | ICD-10-CM | POA: Diagnosis not present

## 2023-05-16 DIAGNOSIS — D631 Anemia in chronic kidney disease: Secondary | ICD-10-CM | POA: Diagnosis not present

## 2023-05-16 DIAGNOSIS — D509 Iron deficiency anemia, unspecified: Secondary | ICD-10-CM | POA: Diagnosis not present

## 2023-05-16 DIAGNOSIS — N2581 Secondary hyperparathyroidism of renal origin: Secondary | ICD-10-CM | POA: Diagnosis not present

## 2023-05-16 DIAGNOSIS — N186 End stage renal disease: Secondary | ICD-10-CM | POA: Diagnosis not present

## 2023-05-16 DIAGNOSIS — Z992 Dependence on renal dialysis: Secondary | ICD-10-CM | POA: Diagnosis not present

## 2023-05-16 DIAGNOSIS — R52 Pain, unspecified: Secondary | ICD-10-CM | POA: Diagnosis not present

## 2023-05-18 DIAGNOSIS — D631 Anemia in chronic kidney disease: Secondary | ICD-10-CM | POA: Diagnosis not present

## 2023-05-18 DIAGNOSIS — Z992 Dependence on renal dialysis: Secondary | ICD-10-CM | POA: Diagnosis not present

## 2023-05-18 DIAGNOSIS — D509 Iron deficiency anemia, unspecified: Secondary | ICD-10-CM | POA: Diagnosis not present

## 2023-05-18 DIAGNOSIS — R52 Pain, unspecified: Secondary | ICD-10-CM | POA: Diagnosis not present

## 2023-05-18 DIAGNOSIS — E1040 Type 1 diabetes mellitus with diabetic neuropathy, unspecified: Secondary | ICD-10-CM | POA: Diagnosis not present

## 2023-05-18 DIAGNOSIS — N186 End stage renal disease: Secondary | ICD-10-CM | POA: Diagnosis not present

## 2023-05-18 DIAGNOSIS — N2581 Secondary hyperparathyroidism of renal origin: Secondary | ICD-10-CM | POA: Diagnosis not present

## 2023-05-19 ENCOUNTER — Ambulatory Visit (HOSPITAL_COMMUNITY): Payer: 59

## 2023-05-19 ENCOUNTER — Other Ambulatory Visit: Payer: Self-pay

## 2023-05-19 DIAGNOSIS — M6281 Muscle weakness (generalized): Secondary | ICD-10-CM | POA: Diagnosis not present

## 2023-05-19 DIAGNOSIS — R2689 Other abnormalities of gait and mobility: Secondary | ICD-10-CM

## 2023-05-19 DIAGNOSIS — R262 Difficulty in walking, not elsewhere classified: Secondary | ICD-10-CM | POA: Diagnosis not present

## 2023-05-19 DIAGNOSIS — M546 Pain in thoracic spine: Secondary | ICD-10-CM

## 2023-05-19 NOTE — Therapy (Signed)
 OUTPATIENT PHYSICAL THERAPY THORACOLUMBAR EVALUATION   Patient Name: Lindsey Murillo MRN: [id number] DOB:07-12-1995, 28 y.o., female Today's Date: 05/19/2023  END OF SESSION:  PT End of Session - 05/19/23 1444     Visit Number 1    Number of Visits 8    Date for PT Re-Evaluation 06/17/23    Authorization Type [employer]; please check auth    PT Start Time 1440    PT Stop Time 1515    PT Time Calculation (min) 35 min    Activity Tolerance Patient tolerated treatment well    Behavior During Therapy WFL for tasks assessed/performed             Past Medical History:  Diagnosis Date   Acanthosis nigricans, acquired    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 chronic kidney disease ([name]) 09/25/2021   dialysis M-W-F at [facility] in [place]   Asthma    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 10/21/2021   Diabetic autonomic neuropathy ([name])    Diabetic peripheral neuropathy ([name])    Environmental allergies    Goiter    History of blood transfusion    Hypertension    no meds currently   Hypoglycemia associated with diabetes ([name])    Musculoskeletal pain 04/20/2022   Tachycardia    Thyroiditis, autoimmune    Type 1 diabetes mellitus in patient age 42-19 years with HbA1C goal below 7.5    Past Surgical History:  Procedure Laterality Date   AV FISTULA PLACEMENT Right 12/08/2021   Procedure: ARTERIOVENOUS  FISTULA CREATION VERSUS GRAFT;  Surgeon: Oris Krystal FALCON, MD;  Location: [facility];  Service: Vascular;  Laterality: Right;   BASCILIC VEIN TRANSPOSITION Right 01/12/2022   Procedure: RIGHT ARM SECOND STAGE BASILIC VEIN TRANSPOSITION;  Surgeon: Oris Krystal FALCON, MD;  Location: [facility];  Service: Vascular;  Laterality: Right;   BIOPSY  08/27/2016   Procedure: BIOPSY;  Surgeon: Margo LITTIE Haddock, MD;  Location: [facility];  Service: Endoscopy;;  duodenum; gastric   BIOPSY  06/27/2019   Procedure: BIOPSY;  Surgeon: Shaaron Lamar HERO, MD;  Location: [facility]  END[facility] SU[facility];  Service: Endoscopy;;   BIOPSY  02/09/2022   Procedure: BIOPSY;  Surgeon: Cindie Carlin POUR, DO;  Location: [facility];  Service: Endoscopy;;   BREAST CYST EXCISION Right 11/12/2021   Procedure: RIGHT BREAST ABSCESS INCISION AND DRAINAGE;  Surgeon: Ebbie Cough, MD;  Location: [facility] OR;  Service: General;  Laterality: Right;  LMA   COLONOSCOPY     COLONOSCOPY WITH PROPOFOL  N/A 02/09/2022   Procedure: COLONOSCOPY WITH PROPOFOL ;  Surgeon: Cindie Carlin POUR, DO;  Location: [facility];  Service: Endoscopy;  Laterality: N/A;  7:30am, asa 3, dialysis pt   ESOPHAGOGASTRODUODENOSCOPY N/A 08/27/2016   Dr. Haddock: mild gastritis. Negative celiac. No obvious source for dyspepsia/diarrhea   ESOPHAGOGASTRODUODENOSCOPY (EGD) WITH PROPOFOL  N/A 06/27/2019   rourk: Focal abnormality of the gastric mucosa likely due to trauma (heaving).  Biopsy showed mild gastritis, negative for H. pylori.  Esophageal dilation for history of dysphagia but normal-appearing esophagus.   ESOPHAGOGASTRODUODENOSCOPY (EGD) WITH PROPOFOL  N/A 02/09/2022   Procedure: ESOPHAGOGASTRODUODENOSCOPY (EGD) WITH PROPOFOL ;  Surgeon: Cindie Carlin POUR, DO;  Location[facility];  Service: Endoscopy;  Laterality: N/A;   IR FLUORO GUIDE CV LINE RIGHT  10/13/2021   IR FLUORO GUIDE CV LINE RIGHT  10/19/2021   IR US  GUIDE VASC ACCESS RIGHT  10/13/2021   IRRIGATION AND DEBRIDEMENT ABSCESS N/A 10/04/2021   Procedure: IRRIGATION  AND DEBRIDEMENT NECK ABSCESS;  Surgeon: Rubin Calamity, MD;  Location: [facility] OR;  Service: General;  Laterality: N/A;   Patient Active Problem List   Diagnosis Date Noted   Insulin  long-term use ([name]) 12/23/2022   Amenorrhea, secondary 10/26/2022   Vitamin D  deficiency 08/26/2022   Hyperlipidemia 07/21/2022   Non-adherence to medical treatment 01/21/2022   ESRD (end stage renal disease) ([name]) 12/18/2021   Secondary hyperparathyroidism of renal origin ([name]) 10/21/2021   Physical deconditioning 10/07/2021    Depression 09/25/2021   Anemia in ESRD (end-stage renal disease) ([name]) 09/25/2021   Nephrotic syndrome 05/27/2021   Chronic pancreatitis ([name]) 08/22/2019   Pancreatic pseudocyst/cyst 08/22/2019   GERD (gastroesophageal reflux disease) 08/22/2019   Uncontrolled type 1 diabetes mellitus with hyperglycemia ([name]) 05/12/2015   Essential hypertension, benign 11/30/2012   Asthma     PCP: Dr. Jacqulyn Ahle, DO  REFERRING PROVIDER: Brenna Lin, MD  REFERRING DIAG: M54.6 (ICD-10-CM) - Pain in thoracic spine  Rationale for Evaluation and Treatment: Rehabilitation  THERAPY DIAG:  Pain in thoracic spine - Plan: PT plan of care cert/re-cert  Difficulty in walking, not elsewhere classified - Plan: PT plan of care cert/re-cert  Other abnormalities of gait and mobility - Plan: PT plan of care cert/re-cert  ONSET DATE: a year ago  SUBJECTIVE:                                                                                                                                                                                           SUBJECTIVE STATEMENT: Patient well known to this clinic. Has been on dialysis about 1.5 years now; was here previous episode for gait; balance issues.  Says that is better but she is still using a SPC some.  Sometimes can hurt so bad she has to use RW. Having catheter placed 1/14 for at home dialysis; hopes to be on transplant list soon.   PERTINENT HISTORY:  ESRD dialysis M,W,F; trying to get at home dialysis   PAIN:  Are you having pain? Yes: NPRS scale: 0 to 9; currently 7.5/10 Pain location: between shoulder blades Pain description: aching, stabbing Aggravating factors: moving Relieving factors: unknown  PRECAUTIONS: Fall   WEIGHT BEARING RESTRICTIONS: No  FALLS:  Has patient fallen in last 6 months? No  OCCUPATION: disability  PLOF: Independent with household mobility with device  PATIENT GOALS: decreased pain and more mobility  NEXT MD VISIT: Brenna  06/02/23  OBJECTIVE:  Note: Objective measures were completed at Evaluation unless otherwise noted.  DIAGNOSTIC FINDINGS:  Clinical:  thoracic spine pain, no trauma   X-rays were done of the thoracic spine, two views.  Alignment of the thoracic spine is good.  No fracture is noted.  Bone quality is good.   Impression:  negative thoracic spine, no acute findings.    PATIENT SURVEYS:  Modified Oswestry 15/50 30%   COGNITION: Overall cognitive status: Within functional limits for tasks assessed     SENSATION: Decreased to light touch bilaterally    POSTURE: rounded shoulders and forward head  PALPATION: Mid back tenderness  LUMBAR ROM:   AROM eval  Flexion full  Extension 20% available Tends to lose balance leaning backwards  Right lateral flexion   Left lateral flexion   Right rotation   Left rotation    (Blank rows = not tested)  LOWER EXTREMITY ROM:     Active  Right eval Left eval  Hip flexion    Hip extension    Hip abduction    Hip adduction    Hip internal rotation    Hip external rotation    Knee flexion    Knee extension    Ankle dorsiflexion    Ankle plantarflexion    Ankle inversion    Ankle eversion     (Blank rows = not tested)  LOWER EXTREMITY MMT:    MMT Right eval Left eval  Hip flexion 4 4-  Hip extension    Hip abduction    Hip adduction    Hip internal rotation    Hip external rotation    Knee flexion    Knee extension 5 5  Ankle dorsiflexion Limited range 3- Limited range 3-  Ankle plantarflexion    Ankle inversion    Ankle eversion     (Blank rows = not tested)    FUNCTIONAL TESTS:  5 times sit to stand: 22.48 sec using hands on thighs to push up  GAIT: Distance walked: 50 ft in clinic Assistive device utilized: Single point cane Level of assistance: Modified independence Comments: decreased gait speed; increased lateral trunk moveent  TREATMENT DATE: 05/19/23 physical therapy evaluation and HEP instruction                                                                                                                                  PATIENT EDUCATION:  Education details: Patient educated on exam findings, POC, scope of PT, HEP, and what to expect next visit. Person educated: Patient Education method: Explanation, Demonstration, and Handouts Education comprehension: verbalized understanding, returned demonstration, verbal cues required, and tactile cues required  HOME EXERCISE PROGRAM: Access Code: [email or web address] URL: [email or web address] Date: 05/19/2023 Prepared by: [facility] - [facility]hab  Exercises - Sit to Stand  - 2 x daily - 7 x weekly - 2 sets - 5 reps - Seated Thoracic Lumbar Extension  - 2 x daily - 7 x weekly - 1 sets - 10 reps - Seated Scapular Retraction  - 2 x daily - 7 x weekly - 1 sets - 10 reps - 5 hold  ASSESSMENT:  CLINICAL IMPRESSION: Patient  is a 28 y.o. female who was seen today for physical therapy evaluation and treatment for M54.6 (ICD-10-CM) - Pain in thoracic spine.  Patient demonstrates muscle weakness, reduced ROM, and fascial restrictions which are likely contributing to symptoms of pain and are negatively impacting patient ability to perform ADLs and functional mobility tasks. Patient will benefit from skilled physical therapy services to address these deficits to reduce pain and improve level of function with ADLs and functional mobility tasks.   OBJECTIVE IMPAIRMENTS: Abnormal gait, decreased activity tolerance, decreased balance, difficulty walking, decreased ROM, decreased strength, hypomobility, increased fascial restrictions, and pain.   ACTIVITY LIMITATIONS: bending, sitting, standing, transfers, bed mobility, and locomotion level  PARTICIPATION LIMITATIONS: meal prep, cleaning, laundry, and community activity  PERSONAL FACTORS: 1-2 comorbidities: DM, ESRD  are also affecting patient's functional outcome.   REHAB POTENTIAL: Good  CLINICAL DECISION  MAKING: Evolving/moderate complexity  EVALUATION COMPLEXITY: Moderate   GOALS: Goals reviewed with patient? No  SHORT TERM GOALS: Target date: 06/02/2023  patient will be independent with initial HEP  Baseline: Goal status: INITIAL  2.  Patient will self report 30% improvement to improve tolerance for functional activity  Baseline:  Goal status: INITIAL   LONG TERM GOALS: Target date: 06/17/2023  Patient will be independent in self management strategies to improve quality of life and functional outcomes.  Baseline:  Goal status: INITIAL  2.  Patient will self report 50% improvement to improve tolerance for functional activity  Baseline:  Goal status: INITIAL  3.  Patient will improve 5 times sit to stand score from 22.48 sec to 18 sec to demonstrate improved functional mobility and increased lower extremity strength.   Baseline:  Goal status: INITIAL  4.  Patient will be able to stand/walk x 1 hour to shop without back pain > 2/10 Baseline:  Goal status: INITIAL  5.  Patient will improve Modified Oswestry Score by 5 points  to demonstrate improved perceived functional mobility Baseline:  Goal status: INITIAL  PLAN:  PT FREQUENCY: 2x/week  PT DURATION: 4 weeks  PLANNED INTERVENTIONS: 97164- PT Re-evaluation, 97110-Therapeutic exercises, 97530- Therapeutic activity, 97112- Neuromuscular re-education, 97535- Self Care, 02859- Manual therapy, [phone]- Gait training, [phone]- Orthotic Fit/training, [phone]- Canalith repositioning, [id number]- Aquatic Therapy, [phone]- Splinting, Patient/Family education, Balance training, Stair training, Taping, Dry Needling, Joint mobilization, Joint manipulation, Spinal manipulation, Spinal mobilization, Scar mobilization, and DME instructions. SABRA  PLAN FOR NEXT SESSION: Review HEP and goals; postural strengthening; thoracic mobility   3:20 PM, 05/19/23 Bartlomiej Jenkinson Small Caeleb Batalla MPT [facility] [place] [phone] Ph:[phone]  [facility]  Medicare Auth Request Information  Date of referral: 04/21/2023 Referring provider: Lemond Stable, MD Referring diagnosis (ICD 10)? M54.6 Treatment diagnosis (ICD 10)? (if different than referring diagnosis) M54.6, R26.2, R26.89  Functional Tool Score: Modified Oswestry 15/50; 30%  What was this (referring dx) caused by? Unspecified  Nature of Condition: Chronic (continuous duration > 3 months)   Laterality: Both  Current Functional Measure Score: Other Modified Oswestry 15/50  Objective measurements identify impairments when they are compared to normal values, the uninvolved extremity, and prior level of function.  [x]  Yes  []  No  Objective assessment of functional ability: Moderate functional limitations   Briefly describe symptoms: mid back pain; increases with activity  How did symptoms start: a year ago  Average pain intensity:  Last 24 hours: 7/10  Past week: 7/10  How often does the pt experience symptoms? Frequently  How much have the symptoms interfered with usual daily activities? Moderately  How has condition changed since care began at this facility? No change  In general, how is the patients overall health? Fair   BACK PAIN (STarT Back Screening Tool) Has the pt only walked short distances because of back pain? Yes Has patient dressed more slowly because of back pain in the past 2 weeks? yes Does patient feel back pain is terrible and will never get any better? no

## 2023-05-20 DIAGNOSIS — D509 Iron deficiency anemia, unspecified: Secondary | ICD-10-CM | POA: Diagnosis not present

## 2023-05-20 DIAGNOSIS — Z992 Dependence on renal dialysis: Secondary | ICD-10-CM | POA: Diagnosis not present

## 2023-05-20 DIAGNOSIS — E1040 Type 1 diabetes mellitus with diabetic neuropathy, unspecified: Secondary | ICD-10-CM | POA: Diagnosis not present

## 2023-05-20 DIAGNOSIS — R52 Pain, unspecified: Secondary | ICD-10-CM | POA: Diagnosis not present

## 2023-05-20 DIAGNOSIS — D631 Anemia in chronic kidney disease: Secondary | ICD-10-CM | POA: Diagnosis not present

## 2023-05-20 DIAGNOSIS — N186 End stage renal disease: Secondary | ICD-10-CM | POA: Diagnosis not present

## 2023-05-20 DIAGNOSIS — N2581 Secondary hyperparathyroidism of renal origin: Secondary | ICD-10-CM | POA: Diagnosis not present

## 2023-05-23 ENCOUNTER — Encounter (HOSPITAL_COMMUNITY): Payer: Self-pay | Admitting: Hematology

## 2023-05-23 DIAGNOSIS — R52 Pain, unspecified: Secondary | ICD-10-CM | POA: Diagnosis not present

## 2023-05-23 DIAGNOSIS — E1040 Type 1 diabetes mellitus with diabetic neuropathy, unspecified: Secondary | ICD-10-CM | POA: Diagnosis not present

## 2023-05-23 DIAGNOSIS — D509 Iron deficiency anemia, unspecified: Secondary | ICD-10-CM | POA: Diagnosis not present

## 2023-05-23 DIAGNOSIS — Z992 Dependence on renal dialysis: Secondary | ICD-10-CM | POA: Diagnosis not present

## 2023-05-23 DIAGNOSIS — N186 End stage renal disease: Secondary | ICD-10-CM | POA: Diagnosis not present

## 2023-05-23 DIAGNOSIS — N2581 Secondary hyperparathyroidism of renal origin: Secondary | ICD-10-CM | POA: Diagnosis not present

## 2023-05-23 DIAGNOSIS — D631 Anemia in chronic kidney disease: Secondary | ICD-10-CM | POA: Diagnosis not present

## 2023-05-24 DIAGNOSIS — E1169 Type 2 diabetes mellitus with other specified complication: Secondary | ICD-10-CM | POA: Diagnosis not present

## 2023-05-24 DIAGNOSIS — Z794 Long term (current) use of insulin: Secondary | ICD-10-CM | POA: Diagnosis not present

## 2023-05-24 DIAGNOSIS — Z7951 Long term (current) use of inhaled steroids: Secondary | ICD-10-CM | POA: Diagnosis not present

## 2023-05-24 DIAGNOSIS — N186 End stage renal disease: Secondary | ICD-10-CM | POA: Diagnosis not present

## 2023-05-24 DIAGNOSIS — E1122 Type 2 diabetes mellitus with diabetic chronic kidney disease: Secondary | ICD-10-CM | POA: Diagnosis not present

## 2023-05-24 DIAGNOSIS — Z79899 Other long term (current) drug therapy: Secondary | ICD-10-CM | POA: Diagnosis not present

## 2023-05-24 DIAGNOSIS — I120 Hypertensive chronic kidney disease with stage 5 chronic kidney disease or end stage renal disease: Secondary | ICD-10-CM | POA: Diagnosis not present

## 2023-05-24 DIAGNOSIS — Z992 Dependence on renal dialysis: Secondary | ICD-10-CM | POA: Diagnosis not present

## 2023-05-25 DIAGNOSIS — Z992 Dependence on renal dialysis: Secondary | ICD-10-CM | POA: Diagnosis not present

## 2023-05-25 DIAGNOSIS — D631 Anemia in chronic kidney disease: Secondary | ICD-10-CM | POA: Diagnosis not present

## 2023-05-25 DIAGNOSIS — D509 Iron deficiency anemia, unspecified: Secondary | ICD-10-CM | POA: Diagnosis not present

## 2023-05-25 DIAGNOSIS — N186 End stage renal disease: Secondary | ICD-10-CM | POA: Diagnosis not present

## 2023-05-25 DIAGNOSIS — N2581 Secondary hyperparathyroidism of renal origin: Secondary | ICD-10-CM | POA: Diagnosis not present

## 2023-05-25 DIAGNOSIS — R52 Pain, unspecified: Secondary | ICD-10-CM | POA: Diagnosis not present

## 2023-05-25 DIAGNOSIS — E1040 Type 1 diabetes mellitus with diabetic neuropathy, unspecified: Secondary | ICD-10-CM | POA: Diagnosis not present

## 2023-05-26 ENCOUNTER — Ambulatory Visit (HOSPITAL_COMMUNITY): Payer: 59 | Admitting: Physical Therapy

## 2023-05-27 DIAGNOSIS — N2581 Secondary hyperparathyroidism of renal origin: Secondary | ICD-10-CM | POA: Diagnosis not present

## 2023-05-27 DIAGNOSIS — D631 Anemia in chronic kidney disease: Secondary | ICD-10-CM | POA: Diagnosis not present

## 2023-05-27 DIAGNOSIS — N186 End stage renal disease: Secondary | ICD-10-CM | POA: Diagnosis not present

## 2023-05-27 DIAGNOSIS — R52 Pain, unspecified: Secondary | ICD-10-CM | POA: Diagnosis not present

## 2023-05-27 DIAGNOSIS — E1040 Type 1 diabetes mellitus with diabetic neuropathy, unspecified: Secondary | ICD-10-CM | POA: Diagnosis not present

## 2023-05-27 DIAGNOSIS — D509 Iron deficiency anemia, unspecified: Secondary | ICD-10-CM | POA: Diagnosis not present

## 2023-05-27 DIAGNOSIS — Z992 Dependence on renal dialysis: Secondary | ICD-10-CM | POA: Diagnosis not present

## 2023-05-30 ENCOUNTER — Ambulatory Visit: Payer: 59 | Admitting: Podiatry

## 2023-05-30 DIAGNOSIS — E1040 Type 1 diabetes mellitus with diabetic neuropathy, unspecified: Secondary | ICD-10-CM | POA: Diagnosis not present

## 2023-05-30 DIAGNOSIS — N2581 Secondary hyperparathyroidism of renal origin: Secondary | ICD-10-CM | POA: Diagnosis not present

## 2023-05-30 DIAGNOSIS — N186 End stage renal disease: Secondary | ICD-10-CM | POA: Diagnosis not present

## 2023-05-30 DIAGNOSIS — D509 Iron deficiency anemia, unspecified: Secondary | ICD-10-CM | POA: Diagnosis not present

## 2023-05-30 DIAGNOSIS — Z992 Dependence on renal dialysis: Secondary | ICD-10-CM | POA: Diagnosis not present

## 2023-05-30 DIAGNOSIS — D631 Anemia in chronic kidney disease: Secondary | ICD-10-CM | POA: Diagnosis not present

## 2023-05-30 DIAGNOSIS — R52 Pain, unspecified: Secondary | ICD-10-CM | POA: Diagnosis not present

## 2023-05-31 ENCOUNTER — Ambulatory Visit (HOSPITAL_COMMUNITY): Payer: 59 | Admitting: Physical Therapy

## 2023-05-31 DIAGNOSIS — R2689 Other abnormalities of gait and mobility: Secondary | ICD-10-CM | POA: Diagnosis not present

## 2023-05-31 DIAGNOSIS — M6281 Muscle weakness (generalized): Secondary | ICD-10-CM | POA: Diagnosis not present

## 2023-05-31 DIAGNOSIS — R262 Difficulty in walking, not elsewhere classified: Secondary | ICD-10-CM

## 2023-05-31 DIAGNOSIS — M546 Pain in thoracic spine: Secondary | ICD-10-CM | POA: Diagnosis not present

## 2023-05-31 NOTE — Therapy (Signed)
OUTPATIENT PHYSICAL THERAPY TREATMENT   Patient Name: Lindsey Murillo MRN: [id number] DOB:Jul 30, 1995, 28 y.o., female Today's Date: 05/31/2023  END OF SESSION:  PT End of Session - 05/31/23 1401     Visit Number 2    Number of Visits 8    Date for PT Re-Evaluation 06/17/23    Authorization Type [employer]; please check auth    PT Start Time 1358    PT Stop Time 1430    PT Time Calculation (min) 32 min    Activity Tolerance Patient tolerated treatment well    Behavior During Therapy WFL for tasks assessed/performed             Past Medical History:  Diagnosis Date   Acanthosis nigricans, acquired    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 chronic kidney disease ([name]) 09/25/2021   dialysis M-W-F at Dialysis at [facility] in [place]   Asthma    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 10/21/2021   Diabetic autonomic neuropathy ([name])    Diabetic peripheral neuropathy ([name])    Environmental allergies    Goiter    History of blood transfusion    Hypertension    no meds currently   Hypoglycemia associated with diabetes ([name])    Musculoskeletal pain 04/20/2022   Tachycardia    Thyroiditis, autoimmune    Type 1 diabetes mellitus in patient age 18-19 years with HbA1C goal below 7.5    Past Surgical History:  Procedure Laterality Date   AV FISTULA PLACEMENT Right 12/08/2021   Procedure: ARTERIOVENOUS  FISTULA CREATION VERSUS GRAFT;  Surgeon: Larina Earthly, MD;  Location: [facility];  Service: Vascular;  Laterality: Right;   BASCILIC VEIN TRANSPOSITION Right 01/12/2022   Procedure: RIGHT ARM SECOND STAGE BASILIC VEIN TRANSPOSITION;  Surgeon: Larina Earthly, MD;  Location: [facility];  Service: Vascular;  Laterality: Right;   BIOPSY  08/27/2016   Procedure: BIOPSY;  Surgeon: West Bali, MD;  Location: [facility];  Service: Endoscopy;;  duodenum; gastric   BIOPSY  06/27/2019   Procedure: BIOPSY;  Surgeon: Corbin Ade, MD;  Location: [facility];   Service: Endoscopy;;   BIOPSY  02/09/2022   Procedure: BIOPSY;  Surgeon: Lanelle Bal, DO;  Location: [facility];  Service: Endoscopy;;   BREAST CYST EXCISION Right 11/12/2021   Procedure: RIGHT BREAST ABSCESS INCISION AND DRAINAGE;  Surgeon: Emelia Loron, MD;  Location: [facility] OR;  Service: General;  Laterality: Right;  LMA   COLONOSCOPY     COLONOSCOPY WITH PROPOFOL N/A 02/09/2022   Procedure: COLONOSCOPY WITH PROPOFOL;  Surgeon: Lanelle Bal, DO;  Location: [facility];  Service: Endoscopy;  Laterality: N/A;  7:30am, asa 3, dialysis pt   ESOPHAGOGASTRODUODENOSCOPY N/A 08/27/2016   Dr. Darrick Penna: mild gastritis. Negative celiac. No obvious source for dyspepsia/diarrhea   ESOPHAGOGASTRODUODENOSCOPY (EGD) WITH PROPOFOL N/A 06/27/2019   rourk: Focal abnormality of the gastric mucosa likely due to trauma (heaving).  Biopsy showed mild gastritis, negative for H. pylori.  Esophageal dilation for history of dysphagia but normal-appearing esophagus.   ESOPHAGOGASTRODUODENOSCOPY (EGD) WITH PROPOFOL N/A 02/09/2022   Procedure: ESOPHAGOGASTRODUODENOSCOPY (EGD) WITH PROPOFOL;  Surgeon: Lanelle Bal, DO;  Location: [facility];  Service: Endoscopy;  Laterality: N/A;   IR FLUORO GUIDE CV LINE RIGHT  10/13/2021   IR FLUORO GUIDE CV LINE RIGHT  10/19/2021   IR US GUIDE VASC ACCESS RIGHT  10/13/2021   IRRIGATION AND DEBRIDEMENT ABSCESS N/A 10/04/2021   Procedure: IRRIGATION AND  DEBRIDEMENT NECK ABSCESS;  Surgeon: Axel Filler, MD;  Location: [facility] OR;  Service: General;  Laterality: N/A;   Patient Active Problem List   Diagnosis Date Noted   Insulin long-term use ([name]) 12/23/2022   Amenorrhea, secondary 10/26/2022   Vitamin D deficiency 08/26/2022   Hyperlipidemia 07/21/2022   Non-adherence to medical treatment 01/21/2022   ESRD (end stage renal disease) ([name]) 12/18/2021   Secondary hyperparathyroidism of renal origin ([name]) 10/21/2021   Physical deconditioning 10/07/2021   Depression  09/25/2021   Anemia in ESRD (end-stage renal disease) ([name]) 09/25/2021   Nephrotic syndrome 05/27/2021   Chronic pancreatitis ([name]) 08/22/2019   Pancreatic pseudocyst/cyst 08/22/2019   GERD (gastroesophageal reflux disease) 08/22/2019   Uncontrolled type 1 diabetes mellitus with hyperglycemia ([name]) 05/12/2015   Essential hypertension, benign 11/30/2012   Asthma     PCP: Dr. Everlene Other, DO  REFERRING PROVIDER: Darreld Mclean, MD  REFERRING DIAG: M54.6 (ICD-10-CM) - Pain in thoracic spine  Rationale for Evaluation and Treatment: Rehabilitation  THERAPY DIAG:  No diagnosis found.  ONSET DATE: a year ago  SUBJECTIVE:                                                                                                                                                                                           SUBJECTIVE STATEMENT: Pt states she had to cancel her last appt due to having surgery for her new dialysis port.  Reports she is doing well today, been completing her HEP.  No pain.  Evaluation: Patient well known to this clinic. Has been on dialysis about 1.5 years now; was here previous episode for gait; balance issues.  Says that is better but she is still using a SPC some.  Sometimes can hurt so bad she has to use RW. Having catheter placed 1/14 for at home dialysis; hopes to be on transplant list soon.   PERTINENT HISTORY:  ESRD dialysis M,W,F; trying to get at home dialysis   PAIN:  Are you having pain? Yes: NPRS scale: 0 to 9; currently 7.5/10 Pain location: between shoulder blades Pain description: aching, stabbing Aggravating factors: moving Relieving factors: unknown  PRECAUTIONS: Fall   WEIGHT BEARING RESTRICTIONS: No  FALLS:  Has patient fallen in last 6 months? No  OCCUPATION: disability  PLOF: Independent with household mobility with device  PATIENT GOALS: decreased pain and more mobility  NEXT MD VISIT: Hilda Lias 06/02/23  OBJECTIVE:  Note: Objective  measures were completed at Evaluation unless otherwise noted.  DIAGNOSTIC FINDINGS:  Clinical:  thoracic spine pain, no trauma   X-rays were done of the thoracic spine, two views.   Alignment of the  thoracic spine is good.  No fracture is noted.  Bone quality is good.   Impression:  negative thoracic spine, no acute findings.    PATIENT SURVEYS:  Modified Oswestry 15/50 30%   COGNITION: Overall cognitive status: Within functional limits for tasks assessed     SENSATION: Decreased to light touch bilaterally    POSTURE: rounded shoulders and forward head  PALPATION: Mid back tenderness  LUMBAR ROM:   AROM eval  Flexion full  Extension 20% available Tends to lose balance leaning backwards  Right lateral flexion   Left lateral flexion   Right rotation   Left rotation    (Blank rows = not tested)  LOWER EXTREMITY ROM:     Active  Right eval Left eval  Hip flexion    Hip extension    Hip abduction    Hip adduction    Hip internal rotation    Hip external rotation    Knee flexion    Knee extension    Ankle dorsiflexion    Ankle plantarflexion    Ankle inversion    Ankle eversion     (Blank rows = not tested)  LOWER EXTREMITY MMT:    MMT Right eval Left eval  Hip flexion 4 4-  Hip extension    Hip abduction    Hip adduction    Hip internal rotation    Hip external rotation    Knee flexion    Knee extension 5 5  Ankle dorsiflexion Limited range 3- Limited range 3-  Ankle plantarflexion    Ankle inversion    Ankle eversion     (Blank rows = not tested)    FUNCTIONAL TESTS:  5 times sit to stand: 22.48 sec using hands on thighs to push up  GAIT: Distance walked: 50 ft in clinic Assistive device utilized: Single point cane Level of assistance: Modified independence Comments: decreased gait speed; increased lateral trunk moveent  TREATMENT DATE:  05/31/23 Goal review/POC moving forward HEP review: scap retraction 10X, thoracic extension  10X in chair, STS 10X no UE Standing:  RTB rows 10X  RTB shoulder extensions 10X  05/19/23 physical therapy evaluation and HEP instruction                                                                                                                                 PATIENT EDUCATION:  Education details: Patient educated on exam findings, POC, scope of PT, HEP, and what to expect next visit. Person educated: Patient Education method: Explanation, Demonstration, and Handouts Education comprehension: verbalized understanding, returned demonstration, verbal cues required, and tactile cues required  HOME EXERCISE PROGRAM: Access Code: [email or web address] URL: [email or web address] Date: 05/19/2023 Prepared by: [facility]  Exercises - Sit to Stand  - 2 x daily - 7 x weekly - 2 sets - 5 reps - Seated Thoracic Lumbar Extension  - 2 x daily - 7 x weekly - 1 sets - 10 reps -  Seated Scapular Retraction  - 2 x daily - 7 x weekly - 1 sets - 10 reps - 5" hold  ASSESSMENT:  CLINICAL IMPRESSION: Reviewed goals and POC moving forward. Educated on posture and importance of keeping upright/aware.  Began standing strengthening exercises for upper traps and scap mm.  Pt required both verbal and tactile cues to complete in correct form with correct hold time.  Updated HEP to included these.  Pt also with difficulty completing all 10 repetitions of sit to stands without UE due to weakness, requiring several rest breaks due to weakness. Treatment limited this session by late start due to therapist running late. Pt will continue to benefit from skilled therapy.  Patient is a 28 y.o. female who was seen today for physical therapy evaluation and treatment for M54.6 (ICD-10-CM) - Pain in thoracic spine.  Patient demonstrates muscle weakness, reduced ROM, and fascial restrictions which are likely contributing to symptoms of pain and are negatively impacting patient ability to perform ADLs and functional mobility  tasks. Patient will benefit from skilled physical therapy services to address these deficits to reduce pain and improve level of function with ADLs and functional mobility tasks.   OBJECTIVE IMPAIRMENTS: Abnormal gait, decreased activity tolerance, decreased balance, difficulty walking, decreased ROM, decreased strength, hypomobility, increased fascial restrictions, and pain.   ACTIVITY LIMITATIONS: bending, sitting, standing, transfers, bed mobility, and locomotion level  PARTICIPATION LIMITATIONS: meal prep, cleaning, laundry, and community activity  PERSONAL FACTORS: 1-2 comorbidities: DM, ESRD  are also affecting patient's functional outcome.   REHAB POTENTIAL: Good  CLINICAL DECISION MAKING: Evolving/moderate complexity  EVALUATION COMPLEXITY: Moderate   GOALS: Goals reviewed with patient? Yes  SHORT TERM GOALS: Target date: 06/02/2023  patient will be independent with initial HEP  Baseline: Goal status: IN PROGRESS  2.  Patient will self report 30% improvement to improve tolerance for functional activity  Baseline:  Goal status: IN PROGRESS   LONG TERM GOALS: Target date: 06/17/2023  Patient will be independent in self management strategies to improve quality of life and functional outcomes.  Baseline:  Goal status: IN PROGRESS  2.  Patient will self report 50% improvement to improve tolerance for functional activity  Baseline:  Goal status: IN PROGRESS  3.  Patient will improve 5 times sit to stand score from 22.48 sec to 18 sec to demonstrate improved functional mobility and increased lower extremity strength.   Baseline:  Goal status: IN PROGRESS  4.  Patient will be able to stand/walk x 1 hour to shop without back pain > 2/10 Baseline:  Goal status: IN PROGRESS  5.  Patient will improve Modified Oswestry Score by 5 points  to demonstrate improved perceived functional mobility Baseline:  Goal status: IN PROGRESS PLAN:  PT FREQUENCY: 2x/week  PT  DURATION: 4 weeks  PLANNED INTERVENTIONS: 97164- PT Re-evaluation, 97110-Therapeutic exercises, 97530- Therapeutic activity, 97112- Neuromuscular re-education, 97535- Self Care, 16109- Manual therapy, [phone]- Gait training, [phone]- Orthotic Fit/training, [phone]- Canalith repositioning, [id number]- Aquatic Therapy, [phone]- Splinting, Patient/Family education, Balance training, Stair training, Taping, Dry Needling, Joint mobilization, Joint manipulation, Spinal manipulation, Spinal mobilization, Scar mobilization, and DME instructions. Marland Kitchen  PLAN FOR NEXT SESSION: continue to improve postural strengthening; thoracic mobility   2:02 PM, 05/31/23 Lurena Nida, PTA/CLT [facility] Ph: [phone]

## 2023-06-01 DIAGNOSIS — D509 Iron deficiency anemia, unspecified: Secondary | ICD-10-CM | POA: Diagnosis not present

## 2023-06-01 DIAGNOSIS — Z992 Dependence on renal dialysis: Secondary | ICD-10-CM | POA: Diagnosis not present

## 2023-06-01 DIAGNOSIS — N2581 Secondary hyperparathyroidism of renal origin: Secondary | ICD-10-CM | POA: Diagnosis not present

## 2023-06-01 DIAGNOSIS — N186 End stage renal disease: Secondary | ICD-10-CM | POA: Diagnosis not present

## 2023-06-01 DIAGNOSIS — R52 Pain, unspecified: Secondary | ICD-10-CM | POA: Diagnosis not present

## 2023-06-01 DIAGNOSIS — E1040 Type 1 diabetes mellitus with diabetic neuropathy, unspecified: Secondary | ICD-10-CM | POA: Diagnosis not present

## 2023-06-01 DIAGNOSIS — D631 Anemia in chronic kidney disease: Secondary | ICD-10-CM | POA: Diagnosis not present

## 2023-06-02 ENCOUNTER — Ambulatory Visit (HOSPITAL_COMMUNITY): Payer: 59

## 2023-06-02 ENCOUNTER — Encounter (HOSPITAL_COMMUNITY): Payer: Self-pay | Admitting: Hematology

## 2023-06-02 ENCOUNTER — Encounter: Payer: Self-pay | Admitting: Orthopaedic Surgery

## 2023-06-02 ENCOUNTER — Ambulatory Visit: Payer: 59 | Admitting: Orthopaedic Surgery

## 2023-06-02 VITALS — BP 112/72 | HR 102

## 2023-06-02 DIAGNOSIS — M546 Pain in thoracic spine: Secondary | ICD-10-CM | POA: Diagnosis not present

## 2023-06-02 DIAGNOSIS — R2689 Other abnormalities of gait and mobility: Secondary | ICD-10-CM

## 2023-06-02 DIAGNOSIS — R262 Difficulty in walking, not elsewhere classified: Secondary | ICD-10-CM | POA: Diagnosis not present

## 2023-06-02 DIAGNOSIS — N186 End stage renal disease: Secondary | ICD-10-CM | POA: Diagnosis not present

## 2023-06-02 DIAGNOSIS — M6281 Muscle weakness (generalized): Secondary | ICD-10-CM | POA: Diagnosis not present

## 2023-06-02 NOTE — Therapy (Signed)
OUTPATIENT PHYSICAL THERAPY TREATMENT   Patient Name: Lindsey Murillo MRN: [id number] DOB:Mar 11, 1996, 28 y.o., female Today's Date: 06/02/2023  END OF SESSION:  PT End of Session - 06/02/23 1151     Visit Number 3    Number of Visits 8    Date for PT Re-Evaluation 06/17/23    Authorization Type [employer] Medicare; please check auth    PT Start Time 1150    PT Stop Time 1230    PT Time Calculation (min) 40 min    Activity Tolerance Patient tolerated treatment well    Behavior During Therapy [facility] for tasks assessed/performed             Past Medical History:  Diagnosis Date   Acanthosis nigricans, acquired    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 chronic kidney disease ([name]) 09/25/2021   dialysis M-W-F at Dialysis at [facility] kidney care in [place]   Asthma    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 10/21/2021   Diabetic autonomic neuropathy ([name])    Diabetic peripheral neuropathy ([name])    Environmental allergies    Goiter    History of blood transfusion    Hypertension    no meds currently   Hypoglycemia associated with diabetes ([name])    Musculoskeletal pain 04/20/2022   Tachycardia    Thyroiditis, autoimmune    Type 1 diabetes mellitus in patient age 49-19 years with HbA1C goal below 7.5    Past Surgical History:  Procedure Laterality Date   AV FISTULA PLACEMENT Right 12/08/2021   Procedure: ARTERIOVENOUS  FISTULA CREATION VERSUS GRAFT;  Surgeon: Larina Earthly, MD;  Location: [facility] ORS;  Service: Vascular;  Laterality: Right;   BASCILIC VEIN TRANSPOSITION Right 01/12/2022   Procedure: RIGHT ARM SECOND STAGE BASILIC VEIN TRANSPOSITION;  Surgeon: Larina Earthly, MD;  Location: [facility] ORS;  Service: Vascular;  Laterality: Right;   BIOPSY  08/27/2016   Procedure: BIOPSY;  Surgeon: West Bali, MD;  Location: [facility] ENDO SUITE;  Service: Endoscopy;;  duodenum; gastric   BIOPSY  06/27/2019   Procedure: BIOPSY;  Surgeon: Corbin Ade, MD;  Location: [facility];   Service: Endoscopy;;   BIOPSY  02/09/2022   Procedure: BIOPSY;  Surgeon: Lanelle Bal, DO;  Location: [facility];  Service: Endoscopy;;   BREAST CYST EXCISION Right 11/12/2021   Procedure: RIGHT BREAST ABSCESS INCISION AND DRAINAGE;  Surgeon: Emelia Loron, MD;  Location: [facility] OR;  Service: General;  Laterality: Right;  LMA   COLONOSCOPY     COLONOSCOPY WITH PROPOFOL N/A 02/09/2022   Procedure: COLONOSCOPY WITH PROPOFOL;  Surgeon: Lanelle Bal, DO;  Location: [facility];  Service: Endoscopy;  Laterality: N/A;  7:30am, asa 3, dialysis pt   ESOPHAGOGASTRODUODENOSCOPY N/A 08/27/2016   Dr. Darrick Penna: mild gastritis. Negative celiac. No obvious source for dyspepsia/diarrhea   ESOPHAGOGASTRODUODENOSCOPY (EGD) WITH PROPOFOL N/A 06/27/2019   rourk: Focal abnormality of the gastric mucosa likely due to trauma (heaving).  Biopsy showed mild gastritis, negative for H. pylori.  Esophageal dilation for history of dysphagia but normal-appearing esophagus.   ESOPHAGOGASTRODUODENOSCOPY (EGD) WITH PROPOFOL N/A 02/09/2022   Procedure: ESOPHAGOGASTRODUODENOSCOPY (EGD) WITH PROPOFOL;  Surgeon: Lanelle Bal, DO;  Location[facility];  Service: Endoscopy;  Laterality: N/A;   IR FLUORO GUIDE CV LINE RIGHT  10/13/2021   IR FLUORO GUIDE CV LINE RIGHT  10/19/2021   IR US GUIDE VASC ACCESS RIGHT  10/13/2021   IRRIGATION AND DEBRIDEMENT ABSCESS N/A 10/04/2021   Procedure: IRRIGATION AND  DEBRIDEMENT NECK ABSCESS;  Surgeon: Axel Filler, MD;  Location: [facility] OR;  Service: General;  Laterality: N/A;   Patient Active Problem List   Diagnosis Date Noted   Insulin long-term use ([name]) 12/23/2022   Amenorrhea, secondary 10/26/2022   Vitamin D deficiency 08/26/2022   Hyperlipidemia 07/21/2022   Non-adherence to medical treatment 01/21/2022   ESRD (end stage renal disease) ([name]) 12/18/2021   Secondary hyperparathyroidism of renal origin ([name]) 10/21/2021   Physical deconditioning 10/07/2021   Depression  09/25/2021   Anemia in ESRD (end-stage renal disease) ([name]) 09/25/2021   Nephrotic syndrome 05/27/2021   Chronic pancreatitis ([name]) 08/22/2019   Pancreatic pseudocyst/cyst 08/22/2019   GERD (gastroesophageal reflux disease) 08/22/2019   Uncontrolled type 1 diabetes mellitus with hyperglycemia ([name]) 05/12/2015   Essential hypertension, benign 11/30/2012   Asthma     PCP: Dr. Everlene Other, DO  REFERRING PROVIDER: Darreld Mclean, MD  REFERRING DIAG: M54.6 (ICD-10-CM) - Pain in thoracic spine  Rationale for Evaluation and Treatment: Rehabilitation  THERAPY DIAG:  Pain in thoracic spine  Difficulty in walking, not elsewhere classified  Other abnormalities of gait and mobility  ONSET DATE: a year ago  SUBJECTIVE:                                                                                                                                                                                           SUBJECTIVE STATEMENT: Sore in her abdomen; 2/10 from having new dialysis port placed; 5/10 pain in thoracic spine today.    Evaluation: Patient well known to this clinic. Has been on dialysis about 1.5 years now; was here previous episode for gait; balance issues.  Says that is better but she is still using a SPC some.  Sometimes can hurt so bad she has to use RW. Having catheter placed 1/14 for at home dialysis; hopes to be on transplant list soon.   PERTINENT HISTORY:  ESRD dialysis M,W,F; trying to get at home dialysis   PAIN:  Are you having pain? Yes: NPRS scale: 0 to 9; currently 7.5/10 Pain location: between shoulder blades Pain description: aching, stabbing Aggravating factors: moving Relieving factors: unknown  PRECAUTIONS: Fall   WEIGHT BEARING RESTRICTIONS: No  FALLS:  Has patient fallen in last 6 months? No  OCCUPATION: disability  PLOF: Independent with household mobility with device  PATIENT GOALS: decreased pain and more mobility  NEXT MD VISIT: Hilda Lias  06/02/23  OBJECTIVE:  Note: Objective measures were completed at Evaluation unless otherwise noted.  DIAGNOSTIC FINDINGS:  Clinical:  thoracic spine pain, no trauma   X-rays were done of the thoracic spine, two views.   Alignment  of the thoracic spine is good.  No fracture is noted.  Bone quality is good.   Impression:  negative thoracic spine, no acute findings.    PATIENT SURVEYS:  Modified Oswestry 15/50 30%   COGNITION: Overall cognitive status: Within functional limits for tasks assessed     SENSATION: Decreased to light touch bilaterally    POSTURE: rounded shoulders and forward head  PALPATION: Mid back tenderness  LUMBAR ROM:   AROM eval  Flexion full  Extension 20% available Tends to lose balance leaning backwards  Right lateral flexion   Left lateral flexion   Right rotation   Left rotation    (Blank rows = not tested)  LOWER EXTREMITY ROM:     Active  Right eval Left eval  Hip flexion    Hip extension    Hip abduction    Hip adduction    Hip internal rotation    Hip external rotation    Knee flexion    Knee extension    Ankle dorsiflexion    Ankle plantarflexion    Ankle inversion    Ankle eversion     (Blank rows = not tested)  LOWER EXTREMITY MMT:    MMT Right eval Left eval  Hip flexion 4 4-  Hip extension    Hip abduction    Hip adduction    Hip internal rotation    Hip external rotation    Knee flexion    Knee extension 5 5  Ankle dorsiflexion Limited range 3- Limited range 3-  Ankle plantarflexion    Ankle inversion    Ankle eversion     (Blank rows = not tested)    FUNCTIONAL TESTS:  5 times sit to stand: 22.48 sec using hands on thighs to push up  GAIT: Distance walked: 50 ft in clinic Assistive device utilized: Single point cane Level of assistance: Modified independence Comments: decreased gait speed; increased lateral trunk moveent  TREATMENT DATE:  06/02/23 Seated  Thoracic extension x 10 Scapular  retraction 5" x 10 Sit to stand x 5 with hands pushing up on knees Thoracic rotation x 10 each Shoulder external rotation bilaterally yellow theraband 2 x 10 Scapular retraction yellow theraband 2 x 10 UBE  backwards x 2'; forwards x 2' Shoulder shrugs up, back and down x 10 Wall push ups 2 x 10 Updated HEP    05/31/23 Goal review/POC moving forward HEP review: scap retraction 10X, thoracic extension 10X in chair, STS 10X no UE Standing:  RTB rows 10X  RTB shoulder extensions 10X  05/19/23 physical therapy evaluation and HEP instruction                                                                                                                                 PATIENT EDUCATION:  Education details: Patient educated on exam findings, POC, scope of PT, HEP, and what to expect next visit. Person educated: Patient Education method: Explanation,  Demonstration, and Handouts Education comprehension: verbalized understanding, returned demonstration, verbal cues required, and tactile cues required  HOME EXERCISE PROGRAM:  06/02/23 Seated Bilateral Shoulder External Rotation with Resistance  - 2 x daily - 7 x weekly - 2 sets - 10 reps - Scapular Retraction with Resistance  - 2 x daily - 7 x weekly - 2 sets - 10 reps Access Code: [email or web address] URL: [email or web address] Date: 05/19/2023 Prepared by: [facility] - Rehab  Exercises - Sit to Stand  - 2 x daily - 7 x weekly - 2 sets - 5 reps - Seated Thoracic Lumbar Extension  - 2 x daily - 7 x weekly - 1 sets - 10 reps - Seated Scapular Retraction  - 2 x daily - 7 x weekly - 1 sets - 10 reps - 5" hold  ASSESSMENT:  CLINICAL IMPRESSION: Todays session with focus on core and postural strength; spinal mobility.  She tends to sit in a trunk flexed; slumped position.  Improves with cues.  Sit to stand today required patient to widen her BOS and push up on knees to stand.  Updated HEP with theraband external rotation of shoulders and shoulder  retractions.  Patient will benefit from continued skilled therapy services to address deficits and promote return to optimal function.       Patient is a 28 y.o. female who was seen today for physical therapy evaluation and treatment for M54.6 (ICD-10-CM) - Pain in thoracic spine.  Patient demonstrates muscle weakness, reduced ROM, and fascial restrictions which are likely contributing to symptoms of pain and are negatively impacting patient ability to perform ADLs and functional mobility tasks. Patient will benefit from skilled physical therapy services to address these deficits to reduce pain and improve level of function with ADLs and functional mobility tasks.   OBJECTIVE IMPAIRMENTS: Abnormal gait, decreased activity tolerance, decreased balance, difficulty walking, decreased ROM, decreased strength, hypomobility, increased fascial restrictions, and pain.   ACTIVITY LIMITATIONS: bending, sitting, standing, transfers, bed mobility, and locomotion level  PARTICIPATION LIMITATIONS: meal prep, cleaning, laundry, and community activity  PERSONAL FACTORS: 1-2 comorbidities: DM, ESRD  are also affecting patient's functional outcome.   REHAB POTENTIAL: Good  CLINICAL DECISION MAKING: Evolving/moderate complexity  EVALUATION COMPLEXITY: Moderate   GOALS: Goals reviewed with patient? Yes  SHORT TERM GOALS: Target date: 06/02/2023  patient will be independent with initial HEP  Baseline: Goal status: IN PROGRESS  2.  Patient will self report 30% improvement to improve tolerance for functional activity  Baseline:  Goal status: IN PROGRESS   LONG TERM GOALS: Target date: 06/17/2023  Patient will be independent in self management strategies to improve quality of life and functional outcomes.  Baseline:  Goal status: IN PROGRESS  2.  Patient will self report 50% improvement to improve tolerance for functional activity  Baseline:  Goal status: IN PROGRESS  3.  Patient will  improve 5 times sit to stand score from 22.48 sec to 18 sec to demonstrate improved functional mobility and increased lower extremity strength.   Baseline:  Goal status: IN PROGRESS  4.  Patient will be able to stand/walk x 1 hour to shop without back pain > 2/10 Baseline:  Goal status: IN PROGRESS  5.  Patient will improve Modified Oswestry Score by 5 points  to demonstrate improved perceived functional mobility Baseline:  Goal status: IN PROGRESS PLAN:  PT FREQUENCY: 2x/week  PT DURATION: 4 weeks  PLANNED INTERVENTIONS: 97164- PT Re-evaluation, 97110-Therapeutic exercises, 97530- Therapeutic activity, [id number]- Neuromuscular re-education, 97535- Self Care,  59563- Manual therapy, [id number]- Gait training, 87564- Orthotic Fit/training, 33295- Canalith repositioning, 18841- Aquatic Therapy, [phone]- Splinting, Patient/Family education, Balance training, Stair training, Taping, Dry Needling, Joint mobilization, Joint manipulation, Spinal manipulation, Spinal mobilization, Scar mobilization, and DME instructions. Marland Kitchen  PLAN FOR NEXT SESSION: continue to improve postural strengthening; thoracic mobility; has to attend a class next week for at home dialysis so will call if she cannot attend PT.   12:33 PM, 06/02/23 Romani Wilbon Small Giovanie Lefebre MPT [facility] [place] [phone] Ph:[phone]

## 2023-06-02 NOTE — Progress Notes (Signed)
My back is just a little better.  She has had less pain with the thoracic spine over the last few weeks. The very cold weather bothers her. She has no trauma, no weakness. She is going to PT and that helps.  Thoracic spine is a little tender between the scapula areas.  She has no spasm.  NV intact.  Encounter Diagnoses  Name Primary?   Pain in thoracic spine Yes   ESRD (end stage renal disease) ([name])    She is hoping to hear about a transplant soon for the kidney.  Return in one month.  Continue PT.  Call if any problem.  Precautions discussed.  Electronically Signed Darreld Mclean, MD 1/23/20259:42 AM

## 2023-06-03 DIAGNOSIS — N186 End stage renal disease: Secondary | ICD-10-CM | POA: Diagnosis not present

## 2023-06-03 DIAGNOSIS — R52 Pain, unspecified: Secondary | ICD-10-CM | POA: Diagnosis not present

## 2023-06-03 DIAGNOSIS — D631 Anemia in chronic kidney disease: Secondary | ICD-10-CM | POA: Diagnosis not present

## 2023-06-03 DIAGNOSIS — Z992 Dependence on renal dialysis: Secondary | ICD-10-CM | POA: Diagnosis not present

## 2023-06-03 DIAGNOSIS — D509 Iron deficiency anemia, unspecified: Secondary | ICD-10-CM | POA: Diagnosis not present

## 2023-06-03 DIAGNOSIS — N2581 Secondary hyperparathyroidism of renal origin: Secondary | ICD-10-CM | POA: Diagnosis not present

## 2023-06-03 DIAGNOSIS — E1040 Type 1 diabetes mellitus with diabetic neuropathy, unspecified: Secondary | ICD-10-CM | POA: Diagnosis not present

## 2023-06-06 DIAGNOSIS — E1043 Type 1 diabetes mellitus with diabetic autonomic (poly)neuropathy: Secondary | ICD-10-CM | POA: Diagnosis not present

## 2023-06-06 DIAGNOSIS — N186 End stage renal disease: Secondary | ICD-10-CM | POA: Diagnosis not present

## 2023-06-06 DIAGNOSIS — Z992 Dependence on renal dialysis: Secondary | ICD-10-CM | POA: Diagnosis not present

## 2023-06-06 DIAGNOSIS — N2581 Secondary hyperparathyroidism of renal origin: Secondary | ICD-10-CM | POA: Diagnosis not present

## 2023-06-06 DIAGNOSIS — D631 Anemia in chronic kidney disease: Secondary | ICD-10-CM | POA: Diagnosis not present

## 2023-06-06 DIAGNOSIS — E063 Autoimmune thyroiditis: Secondary | ICD-10-CM | POA: Diagnosis not present

## 2023-06-06 DIAGNOSIS — E1022 Type 1 diabetes mellitus with diabetic chronic kidney disease: Secondary | ICD-10-CM | POA: Diagnosis not present

## 2023-06-06 DIAGNOSIS — E039 Hypothyroidism, unspecified: Secondary | ICD-10-CM | POA: Diagnosis not present

## 2023-06-06 DIAGNOSIS — D509 Iron deficiency anemia, unspecified: Secondary | ICD-10-CM | POA: Diagnosis not present

## 2023-06-07 ENCOUNTER — Encounter (HOSPITAL_COMMUNITY): Payer: 59

## 2023-06-08 ENCOUNTER — Other Ambulatory Visit: Payer: Self-pay

## 2023-06-08 ENCOUNTER — Emergency Department (HOSPITAL_COMMUNITY)
Admission: EM | Admit: 2023-06-08 | Discharge: 2023-06-08 | Disposition: A | Discharge status: Home / Self Care | Payer: 59 | Attending: Emergency Medicine | Admitting: Emergency Medicine

## 2023-06-08 ENCOUNTER — Encounter (HOSPITAL_COMMUNITY): Payer: Self-pay | Admitting: *Deleted

## 2023-06-08 DIAGNOSIS — I120 Hypertensive chronic kidney disease with stage 5 chronic kidney disease or end stage renal disease: Secondary | ICD-10-CM | POA: Diagnosis not present

## 2023-06-08 DIAGNOSIS — E1043 Type 1 diabetes mellitus with diabetic autonomic (poly)neuropathy: Secondary | ICD-10-CM | POA: Diagnosis not present

## 2023-06-08 DIAGNOSIS — Z992 Dependence on renal dialysis: Secondary | ICD-10-CM | POA: Insufficient documentation

## 2023-06-08 DIAGNOSIS — N2581 Secondary hyperparathyroidism of renal origin: Secondary | ICD-10-CM | POA: Diagnosis not present

## 2023-06-08 DIAGNOSIS — E1022 Type 1 diabetes mellitus with diabetic chronic kidney disease: Secondary | ICD-10-CM | POA: Diagnosis not present

## 2023-06-08 DIAGNOSIS — N186 End stage renal disease: Secondary | ICD-10-CM | POA: Insufficient documentation

## 2023-06-08 DIAGNOSIS — Z794 Long term (current) use of insulin: Secondary | ICD-10-CM | POA: Insufficient documentation

## 2023-06-08 DIAGNOSIS — R112 Nausea with vomiting, unspecified: Secondary | ICD-10-CM | POA: Insufficient documentation

## 2023-06-08 DIAGNOSIS — R197 Diarrhea, unspecified: Secondary | ICD-10-CM | POA: Diagnosis not present

## 2023-06-08 LAB — COMPREHENSIVE METABOLIC PANEL
ALT: 10 U/L (ref 0–44)
AST: 11 U/L — ABNORMAL LOW (ref 15–41)
Albumin: 3 g/dL — ABNORMAL LOW (ref 3.5–5.0)
Alkaline Phosphatase: 119 U/L (ref 38–126)
Anion gap: 21 — ABNORMAL HIGH (ref 5–15)
BUN: 55 mg/dL — ABNORMAL HIGH (ref 6–20)
CO2: 25 mmol/L (ref 22–32)
Calcium: 9 mg/dL (ref 8.9–10.3)
Chloride: 89 mmol/L — ABNORMAL LOW (ref 98–111)
Creatinine, Ser: 10.07 mg/dL — ABNORMAL HIGH (ref 0.44–1.00)
GFR, Estimated: 5 mL/min — ABNORMAL LOW (ref >60)
Glucose, Bld: 98 mg/dL (ref 70–99)
Potassium: 3.5 mmol/L (ref 3.5–5.1)
Sodium: 135 mmol/L (ref 135–145)
Total Bilirubin: 1.3 mg/dL — ABNORMAL HIGH (ref 0.0–1.2)
Total Protein: 6.9 g/dL (ref 6.5–8.1)

## 2023-06-08 LAB — CBC
HCT: 31.7 % — ABNORMAL LOW (ref 36.0–46.0)
Hemoglobin: 10.3 g/dL — ABNORMAL LOW (ref 12.0–15.0)
MCH: 29.5 pg (ref 26.0–34.0)
MCHC: 32.5 g/dL (ref 30.0–36.0)
MCV: 90.8 fL (ref 80.0–100.0)
Platelets: 196 10*3/uL (ref 150–400)
RBC: 3.49 MIL/uL — ABNORMAL LOW (ref 3.87–5.11)
RDW: 18.2 % — ABNORMAL HIGH (ref 11.5–15.5)
WBC: 9.6 10*3/uL (ref 4.0–10.5)
nRBC: 0 % (ref 0.0–0.2)

## 2023-06-08 LAB — LIPASE, BLOOD: Lipase: 26 U/L (ref 11–51)

## 2023-06-08 MED ORDER — ONDANSETRON HCL 4 MG/2ML IJ SOLN
4.0000 mg | Freq: Once | INTRAMUSCULAR | Status: AC
  Administered 2023-06-08: 4 mg via INTRAVENOUS
  Filled 2023-06-08: qty 2

## 2023-06-08 MED ORDER — SODIUM CHLORIDE 0.9 % IV BOLUS
1000.0000 mL | Freq: Once | INTRAVENOUS | Status: AC
  Administered 2023-06-08: 1000 mL via INTRAVENOUS

## 2023-06-08 MED ORDER — ONDANSETRON 4 MG PO TBDP: 4.0000 mg | ORAL_TABLET | Freq: Three times a day (TID) | ORAL | 0 refills | Status: DC | PRN

## 2023-06-08 NOTE — ED Provider Notes (Signed)
[name] EMERGENCY DEPARTMENT AT [facility]  Provider Note  CSN: [id number] Arrival date & time: 06/08/23 0140  History Chief Complaint  Patient presents with   Emesis    Lindsey Murillo is a 28 y.o. female with history of ESRD on HD MWF also recently had a PD catheter placed and is 'in training' for home PD. She reports 2-3 days of vomiting and diarrhea. Mother has had similar symptoms. Had dialysis Monday this week, due for next session later today. Not having pain or fever.    Home Medications Prior to Admission medications   Medication Sig Start Date End Date Taking? Authorizing Provider  insulin lispro (HUMALOG KWIKPEN) 100 UNIT/ML KwikPen Inject 5-8 Units into the skin 3 (three) times daily. 05/10/23   Dani Gobble, NP  ondansetron (ZOFRAN-ODT) 4 MG disintegrating tablet Take 1 tablet (4 mg total) by mouth every 8 (eight) hours as needed for nausea or vomiting. 06/08/23  Yes Pollyann Savoy, MD  acetaminophen (TYLENOL) 325 MG tablet Take by mouth. 12/24/21   [provider]  cetirizine HCl (ZYRTEC) 5 MG/5ML SOLN TAKE 1.25 ML TWICE A DAY AS NEEDED FOR A RUNNY NOSE OR [facility] 05/09/23   Padgett, Pilar Grammes, MD  Continuous Glucose Receiver (DEXCOM G7 RECEIVER) DEVI by Does not apply route.    [provider]  Continuous Glucose Sensor (DEXCOM G7 SENSOR) MISC by Does not apply route.    [provider]  fluticasone (FLONASE) 50 MCG/ACT nasal spray Place 2 sprays into both nostrils daily as needed for allergies or rhinitis. 12/23/22   Hetty Blend, FNP  Fluticasone Furoate (ARNUITY ELLIPTA) 100 MCG/ACT AEPB 1 puff 1(ONE) Time per day to prevent cough or wheeze. 03/24/23   Marcelyn Bruins, MD  medroxyPROGESTERone (PROVERA) 10 MG tablet Take provera 10 mg for 10 days every 3 months 11/16/22   Cyril Mourning A, NP  Olopatadine HCl (PATADAY) 0.2 % SOLN 1 drop in each eye 1(ONE) Time per day as needed for red, itchy eyes.  03/24/23   Marcelyn Bruins, MD  Olopatadine-Mometasone Cristal Generous) [phone] MCG/ACT SUSP 2 sprays each nostril 2(TWO) Times a day as needed for runny or stuffy nose. 03/24/23   Marcelyn Bruins, MD  pantoprazole (PROTONIX) 40 MG tablet Take 1 tablet (40 mg total) by mouth 2 (two) times daily before a meal. 03/22/23 03/16/24  Aida Raider, NP  rosuvastatin (CRESTOR) 10 MG tablet Take 1 tablet (10 mg total) by mouth daily. 03/15/23   [name], DO  scopolamine (TRANSDERM-SCOP) 1 MG/3DAYS Place 1 patch (1.5 mg total) onto the skin every 3 (three) days. 02/02/23   Roemhildt, Lorin T, PA-C  SSD 1 % cream APPLY CREAM TO WOUND DAILY AND COVER WITH GAUZE DRESSING 04/01/23   McCaughan, Dia D, DPM  sucroferric oxyhydroxide (VELPHORO) 500 MG chewable tablet Chew 500 mg by mouth 3 (three) times daily with meals. 11/16/21   [provider]  VENTOLIN HFA 108 (90 Base) MCG/ACT inhaler Inhale 2 puffs into the lungs every 4 (four) hours as needed for wheezing or shortness of breath. 2 puffs once every 4 hours as needed for cough, wheeze, shortness of breath and chest tightness. May use 2 puffs 5 to 15 minutes before activity to decrease cough or wheeze. 03/24/23   Marcelyn Bruins, MD  Vitamin D, Ergocalciferol, (DRISDOL) 1.25 MG (50000 UNIT) CAPS capsule Take 50,000 Units by mouth every 7 (seven) days.    [provider]  Allergies    Patient has no known allergies.   Review of Systems   Review of Systems Please see HPI for pertinent positives and negatives  Physical Exam BP 116/85   Pulse (!) 101   Temp 98.2 F (36.8 C) (Oral)   Resp 18   Ht 5\' 9"  (1.753 m)   Wt 48.5 kg   SpO2 93%   BMI 15.80 kg/m   Physical Exam Vitals and nursing note reviewed.  Constitutional:      Appearance: Normal appearance.  HENT:     Head: Normocephalic and atraumatic.     Nose: Nose normal.     Mouth/Throat:     Mouth: Mucous membranes are moist.  Eyes:      Extraocular Movements: Extraocular movements intact.     Conjunctiva/sclera: Conjunctivae normal.  Cardiovascular:     Rate and Rhythm: Normal rate.  Pulmonary:     Effort: Pulmonary effort is normal.     Breath sounds: Normal breath sounds.  Abdominal:     General: Abdomen is flat.     Palpations: Abdomen is soft.     Tenderness: There is no abdominal tenderness. There is no guarding.     Comments: PD catheter in place  Musculoskeletal:        General: No swelling. Normal range of motion.     Cervical back: Neck supple.  Skin:    General: Skin is warm and dry.  Neurological:     General: No focal deficit present.     Mental Status: She is alert.  Psychiatric:        Mood and Affect: Mood normal.     ED Results / Procedures / Treatments   EKG None  Procedures Procedures  Medications Ordered in the ED Medications  sodium chloride 0.9 % bolus 1,000 mL (0 mLs Intravenous Stopped 06/08/23 0633)  ondansetron (ZOFRAN) injection 4 mg (4 mg Intravenous Given 06/08/23 0452)    Initial Impression and Plan  Patient here with several days of vomiting, states she has been unable to keep down anything during that time. Has been to dialysis, denies any missed sessions. Exam and vitals are reassuring. Labs done in triage show CBC with anemia at baseline, CMP with CKD, no other significant elyte abnormalities. Will give IVF and antiemetics and reassess.   ED Course   Clinical Course as of 06/08/23 0644  Wed Jun 08, 2023  0642 Patient tolerating PO. Plan discharge with Rx for zofran, oral hydration as tolerated. Dialysis as scheduled. RTED for any other concerns.  [CS]    Clinical Course User Index [CS] Pollyann Savoy, MD     MDM Rules/Calculators/A&P Medical Decision Making Given presenting complaint, I considered that admission might be necessary. After review of results from ED lab and/or imaging studies, admission to the hospital is not indicated at this time.    Problems  Addressed: ESRD on hemodialysis [facility]): chronic illness or injury Nausea vomiting and diarrhea: acute illness or injury  Amount and/or Complexity of Data Reviewed Labs: ordered. Decision-making details documented in ED Course.  Risk Prescription drug management. Decision regarding hospitalization.     Final Clinical Impression(s) / ED Diagnoses Final diagnoses:  Nausea vomiting and diarrhea  ESRD on hemodialysis ([name])    Rx / DC Orders ED Discharge Orders          Ordered    ondansetron (ZOFRAN-ODT) 4 MG disintegrating tablet  Every 8 hours PRN        06/08/23 7902  Pollyann Savoy, MD 06/08/23 [id number]

## 2023-06-08 NOTE — ED Triage Notes (Signed)
Pt c/o vomiting x 3 days and c/o back pain and diarrhea  Pt went to dialysis on Monday this week  Pt has been around her mother with same symptoms

## 2023-06-08 NOTE — ED Notes (Signed)
Pt given water and made aware of needing a urine sample

## 2023-06-08 NOTE — ED Notes (Addendum)
EDP at bedside

## 2023-06-09 ENCOUNTER — Telehealth (HOSPITAL_COMMUNITY): Payer: Self-pay | Admitting: Physical Therapy

## 2023-06-09 ENCOUNTER — Encounter (HOSPITAL_COMMUNITY): Payer: 59 | Admitting: Physical Therapy

## 2023-06-09 DIAGNOSIS — D631 Anemia in chronic kidney disease: Secondary | ICD-10-CM | POA: Diagnosis not present

## 2023-06-09 DIAGNOSIS — E1043 Type 1 diabetes mellitus with diabetic autonomic (poly)neuropathy: Secondary | ICD-10-CM | POA: Diagnosis not present

## 2023-06-09 DIAGNOSIS — D509 Iron deficiency anemia, unspecified: Secondary | ICD-10-CM | POA: Diagnosis not present

## 2023-06-09 DIAGNOSIS — N186 End stage renal disease: Secondary | ICD-10-CM | POA: Diagnosis not present

## 2023-06-09 DIAGNOSIS — E063 Autoimmune thyroiditis: Secondary | ICD-10-CM | POA: Diagnosis not present

## 2023-06-09 DIAGNOSIS — Z992 Dependence on renal dialysis: Secondary | ICD-10-CM | POA: Diagnosis not present

## 2023-06-09 DIAGNOSIS — E039 Hypothyroidism, unspecified: Secondary | ICD-10-CM | POA: Diagnosis not present

## 2023-06-09 DIAGNOSIS — N2581 Secondary hyperparathyroidism of renal origin: Secondary | ICD-10-CM | POA: Diagnosis not present

## 2023-06-09 DIAGNOSIS — E1022 Type 1 diabetes mellitus with diabetic chronic kidney disease: Secondary | ICD-10-CM | POA: Diagnosis not present

## 2023-06-09 NOTE — Telephone Encounter (Signed)
Seen where pt visited ED yesterday during chart review due to 3 day vomiting and diarrhea.  Left message regarding our 24 hour policy and requested return call as we would be unable to see her today.  Pt did not show or call back.  Lurena Nida, PTA/CLT [facility] Ph: [phone]

## 2023-06-10 DIAGNOSIS — N186 End stage renal disease: Secondary | ICD-10-CM | POA: Diagnosis not present

## 2023-06-10 DIAGNOSIS — N032 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: Secondary | ICD-10-CM | POA: Diagnosis not present

## 2023-06-10 DIAGNOSIS — Z992 Dependence on renal dialysis: Secondary | ICD-10-CM | POA: Diagnosis not present

## 2023-06-11 ENCOUNTER — Encounter (HOSPITAL_COMMUNITY): Payer: Self-pay | Admitting: Hematology

## 2023-06-11 DIAGNOSIS — N2581 Secondary hyperparathyroidism of renal origin: Secondary | ICD-10-CM | POA: Diagnosis not present

## 2023-06-11 DIAGNOSIS — N186 End stage renal disease: Secondary | ICD-10-CM | POA: Diagnosis not present

## 2023-06-11 DIAGNOSIS — E1043 Type 1 diabetes mellitus with diabetic autonomic (poly)neuropathy: Secondary | ICD-10-CM | POA: Diagnosis not present

## 2023-06-11 DIAGNOSIS — E1022 Type 1 diabetes mellitus with diabetic chronic kidney disease: Secondary | ICD-10-CM | POA: Diagnosis not present

## 2023-06-11 DIAGNOSIS — Z992 Dependence on renal dialysis: Secondary | ICD-10-CM | POA: Diagnosis not present

## 2023-06-13 ENCOUNTER — Ambulatory Visit: Payer: 59 | Admitting: Podiatry

## 2023-06-13 DIAGNOSIS — E1043 Type 1 diabetes mellitus with diabetic autonomic (poly)neuropathy: Secondary | ICD-10-CM | POA: Diagnosis not present

## 2023-06-13 DIAGNOSIS — Z992 Dependence on renal dialysis: Secondary | ICD-10-CM | POA: Diagnosis not present

## 2023-06-13 DIAGNOSIS — E1022 Type 1 diabetes mellitus with diabetic chronic kidney disease: Secondary | ICD-10-CM | POA: Diagnosis not present

## 2023-06-13 DIAGNOSIS — N186 End stage renal disease: Secondary | ICD-10-CM | POA: Diagnosis not present

## 2023-06-13 DIAGNOSIS — N2581 Secondary hyperparathyroidism of renal origin: Secondary | ICD-10-CM | POA: Diagnosis not present

## 2023-06-14 ENCOUNTER — Encounter (HOSPITAL_COMMUNITY): Payer: 59

## 2023-06-14 DIAGNOSIS — N186 End stage renal disease: Secondary | ICD-10-CM | POA: Diagnosis not present

## 2023-06-14 DIAGNOSIS — E1022 Type 1 diabetes mellitus with diabetic chronic kidney disease: Secondary | ICD-10-CM | POA: Diagnosis not present

## 2023-06-14 DIAGNOSIS — Z992 Dependence on renal dialysis: Secondary | ICD-10-CM | POA: Diagnosis not present

## 2023-06-14 DIAGNOSIS — E1043 Type 1 diabetes mellitus with diabetic autonomic (poly)neuropathy: Secondary | ICD-10-CM | POA: Diagnosis not present

## 2023-06-14 DIAGNOSIS — N2581 Secondary hyperparathyroidism of renal origin: Secondary | ICD-10-CM | POA: Diagnosis not present

## 2023-06-15 DIAGNOSIS — E1043 Type 1 diabetes mellitus with diabetic autonomic (poly)neuropathy: Secondary | ICD-10-CM | POA: Diagnosis not present

## 2023-06-15 DIAGNOSIS — E063 Autoimmune thyroiditis: Secondary | ICD-10-CM | POA: Diagnosis not present

## 2023-06-15 DIAGNOSIS — E1022 Type 1 diabetes mellitus with diabetic chronic kidney disease: Secondary | ICD-10-CM | POA: Diagnosis not present

## 2023-06-15 DIAGNOSIS — D631 Anemia in chronic kidney disease: Secondary | ICD-10-CM | POA: Diagnosis not present

## 2023-06-15 DIAGNOSIS — Z4932 Encounter for adequacy testing for peritoneal dialysis: Secondary | ICD-10-CM | POA: Diagnosis not present

## 2023-06-15 DIAGNOSIS — Z992 Dependence on renal dialysis: Secondary | ICD-10-CM | POA: Diagnosis not present

## 2023-06-15 DIAGNOSIS — N186 End stage renal disease: Secondary | ICD-10-CM | POA: Diagnosis not present

## 2023-06-15 DIAGNOSIS — N2581 Secondary hyperparathyroidism of renal origin: Secondary | ICD-10-CM | POA: Diagnosis not present

## 2023-06-15 DIAGNOSIS — E039 Hypothyroidism, unspecified: Secondary | ICD-10-CM | POA: Diagnosis not present

## 2023-06-15 DIAGNOSIS — D509 Iron deficiency anemia, unspecified: Secondary | ICD-10-CM | POA: Diagnosis not present

## 2023-06-16 ENCOUNTER — Encounter (HOSPITAL_COMMUNITY): Payer: 59

## 2023-06-16 DIAGNOSIS — E063 Autoimmune thyroiditis: Secondary | ICD-10-CM | POA: Diagnosis not present

## 2023-06-16 DIAGNOSIS — D631 Anemia in chronic kidney disease: Secondary | ICD-10-CM | POA: Diagnosis not present

## 2023-06-16 DIAGNOSIS — Z992 Dependence on renal dialysis: Secondary | ICD-10-CM | POA: Diagnosis not present

## 2023-06-16 DIAGNOSIS — E1043 Type 1 diabetes mellitus with diabetic autonomic (poly)neuropathy: Secondary | ICD-10-CM | POA: Diagnosis not present

## 2023-06-16 DIAGNOSIS — I120 Hypertensive chronic kidney disease with stage 5 chronic kidney disease or end stage renal disease: Secondary | ICD-10-CM | POA: Diagnosis not present

## 2023-06-16 DIAGNOSIS — Z4932 Encounter for adequacy testing for peritoneal dialysis: Secondary | ICD-10-CM | POA: Diagnosis not present

## 2023-06-16 DIAGNOSIS — E039 Hypothyroidism, unspecified: Secondary | ICD-10-CM | POA: Diagnosis not present

## 2023-06-16 DIAGNOSIS — N186 End stage renal disease: Secondary | ICD-10-CM | POA: Diagnosis not present

## 2023-06-16 DIAGNOSIS — E119 Type 2 diabetes mellitus without complications: Secondary | ICD-10-CM | POA: Diagnosis not present

## 2023-06-16 DIAGNOSIS — N2581 Secondary hyperparathyroidism of renal origin: Secondary | ICD-10-CM | POA: Diagnosis not present

## 2023-06-16 DIAGNOSIS — E1022 Type 1 diabetes mellitus with diabetic chronic kidney disease: Secondary | ICD-10-CM | POA: Diagnosis not present

## 2023-06-16 DIAGNOSIS — D509 Iron deficiency anemia, unspecified: Secondary | ICD-10-CM | POA: Diagnosis not present

## 2023-06-17 ENCOUNTER — Telehealth (HOSPITAL_COMMUNITY): Payer: Self-pay

## 2023-06-17 DIAGNOSIS — E1043 Type 1 diabetes mellitus with diabetic autonomic (poly)neuropathy: Secondary | ICD-10-CM | POA: Diagnosis not present

## 2023-06-17 DIAGNOSIS — E1022 Type 1 diabetes mellitus with diabetic chronic kidney disease: Secondary | ICD-10-CM | POA: Diagnosis not present

## 2023-06-17 DIAGNOSIS — E063 Autoimmune thyroiditis: Secondary | ICD-10-CM | POA: Diagnosis not present

## 2023-06-17 DIAGNOSIS — N2581 Secondary hyperparathyroidism of renal origin: Secondary | ICD-10-CM | POA: Diagnosis not present

## 2023-06-17 DIAGNOSIS — Z992 Dependence on renal dialysis: Secondary | ICD-10-CM | POA: Diagnosis not present

## 2023-06-17 DIAGNOSIS — Z4932 Encounter for adequacy testing for peritoneal dialysis: Secondary | ICD-10-CM | POA: Diagnosis not present

## 2023-06-17 DIAGNOSIS — E039 Hypothyroidism, unspecified: Secondary | ICD-10-CM | POA: Diagnosis not present

## 2023-06-17 DIAGNOSIS — N186 End stage renal disease: Secondary | ICD-10-CM | POA: Diagnosis not present

## 2023-06-17 DIAGNOSIS — D631 Anemia in chronic kidney disease: Secondary | ICD-10-CM | POA: Diagnosis not present

## 2023-06-17 DIAGNOSIS — D509 Iron deficiency anemia, unspecified: Secondary | ICD-10-CM | POA: Diagnosis not present

## 2023-06-17 NOTE — Telephone Encounter (Signed)
 No show #2; left message with patient regarding missed appt 06/16/23.  Patient with recent hospital visit per chart review.  Patient does not have any further visits scheduled.  Will discharge unless we hear from patient to reschedule.    7:08 AM, 06/17/23 Lindsey Murillo Small Danille Oppedisano MPT [facility] [place] [phone]

## 2023-06-18 DIAGNOSIS — N186 End stage renal disease: Secondary | ICD-10-CM | POA: Diagnosis not present

## 2023-06-18 DIAGNOSIS — E039 Hypothyroidism, unspecified: Secondary | ICD-10-CM | POA: Diagnosis not present

## 2023-06-18 DIAGNOSIS — Z992 Dependence on renal dialysis: Secondary | ICD-10-CM | POA: Diagnosis not present

## 2023-06-18 DIAGNOSIS — D509 Iron deficiency anemia, unspecified: Secondary | ICD-10-CM | POA: Diagnosis not present

## 2023-06-18 DIAGNOSIS — N2581 Secondary hyperparathyroidism of renal origin: Secondary | ICD-10-CM | POA: Diagnosis not present

## 2023-06-18 DIAGNOSIS — E1022 Type 1 diabetes mellitus with diabetic chronic kidney disease: Secondary | ICD-10-CM | POA: Diagnosis not present

## 2023-06-18 DIAGNOSIS — Z4932 Encounter for adequacy testing for peritoneal dialysis: Secondary | ICD-10-CM | POA: Diagnosis not present

## 2023-06-18 DIAGNOSIS — E063 Autoimmune thyroiditis: Secondary | ICD-10-CM | POA: Diagnosis not present

## 2023-06-18 DIAGNOSIS — D631 Anemia in chronic kidney disease: Secondary | ICD-10-CM | POA: Diagnosis not present

## 2023-06-18 DIAGNOSIS — E1043 Type 1 diabetes mellitus with diabetic autonomic (poly)neuropathy: Secondary | ICD-10-CM | POA: Diagnosis not present

## 2023-06-19 DIAGNOSIS — Z992 Dependence on renal dialysis: Secondary | ICD-10-CM | POA: Diagnosis not present

## 2023-06-19 DIAGNOSIS — Z4932 Encounter for adequacy testing for peritoneal dialysis: Secondary | ICD-10-CM | POA: Diagnosis not present

## 2023-06-19 DIAGNOSIS — N186 End stage renal disease: Secondary | ICD-10-CM | POA: Diagnosis not present

## 2023-06-19 DIAGNOSIS — D509 Iron deficiency anemia, unspecified: Secondary | ICD-10-CM | POA: Diagnosis not present

## 2023-06-19 DIAGNOSIS — N2581 Secondary hyperparathyroidism of renal origin: Secondary | ICD-10-CM | POA: Diagnosis not present

## 2023-06-19 DIAGNOSIS — E063 Autoimmune thyroiditis: Secondary | ICD-10-CM | POA: Diagnosis not present

## 2023-06-19 DIAGNOSIS — E1043 Type 1 diabetes mellitus with diabetic autonomic (poly)neuropathy: Secondary | ICD-10-CM | POA: Diagnosis not present

## 2023-06-19 DIAGNOSIS — E1022 Type 1 diabetes mellitus with diabetic chronic kidney disease: Secondary | ICD-10-CM | POA: Diagnosis not present

## 2023-06-19 DIAGNOSIS — D631 Anemia in chronic kidney disease: Secondary | ICD-10-CM | POA: Diagnosis not present

## 2023-06-19 DIAGNOSIS — E039 Hypothyroidism, unspecified: Secondary | ICD-10-CM | POA: Diagnosis not present

## 2023-06-20 DIAGNOSIS — E1043 Type 1 diabetes mellitus with diabetic autonomic (poly)neuropathy: Secondary | ICD-10-CM | POA: Diagnosis not present

## 2023-06-20 DIAGNOSIS — E063 Autoimmune thyroiditis: Secondary | ICD-10-CM | POA: Diagnosis not present

## 2023-06-20 DIAGNOSIS — N2581 Secondary hyperparathyroidism of renal origin: Secondary | ICD-10-CM | POA: Diagnosis not present

## 2023-06-20 DIAGNOSIS — E039 Hypothyroidism, unspecified: Secondary | ICD-10-CM | POA: Diagnosis not present

## 2023-06-20 DIAGNOSIS — D509 Iron deficiency anemia, unspecified: Secondary | ICD-10-CM | POA: Diagnosis not present

## 2023-06-20 DIAGNOSIS — D631 Anemia in chronic kidney disease: Secondary | ICD-10-CM | POA: Diagnosis not present

## 2023-06-20 DIAGNOSIS — Z4932 Encounter for adequacy testing for peritoneal dialysis: Secondary | ICD-10-CM | POA: Diagnosis not present

## 2023-06-20 DIAGNOSIS — E1022 Type 1 diabetes mellitus with diabetic chronic kidney disease: Secondary | ICD-10-CM | POA: Diagnosis not present

## 2023-06-20 DIAGNOSIS — Z992 Dependence on renal dialysis: Secondary | ICD-10-CM | POA: Diagnosis not present

## 2023-06-20 DIAGNOSIS — N186 End stage renal disease: Secondary | ICD-10-CM | POA: Diagnosis not present

## 2023-06-21 ENCOUNTER — Ambulatory Visit (HOSPITAL_COMMUNITY): Payer: 59 | Attending: Orthopaedic Surgery | Admitting: Physical Therapy

## 2023-06-21 DIAGNOSIS — Z992 Dependence on renal dialysis: Secondary | ICD-10-CM | POA: Diagnosis not present

## 2023-06-21 DIAGNOSIS — D631 Anemia in chronic kidney disease: Secondary | ICD-10-CM | POA: Diagnosis not present

## 2023-06-21 DIAGNOSIS — E1022 Type 1 diabetes mellitus with diabetic chronic kidney disease: Secondary | ICD-10-CM | POA: Diagnosis not present

## 2023-06-21 DIAGNOSIS — M546 Pain in thoracic spine: Secondary | ICD-10-CM | POA: Insufficient documentation

## 2023-06-21 DIAGNOSIS — N2581 Secondary hyperparathyroidism of renal origin: Secondary | ICD-10-CM | POA: Diagnosis not present

## 2023-06-21 DIAGNOSIS — Z4932 Encounter for adequacy testing for peritoneal dialysis: Secondary | ICD-10-CM | POA: Diagnosis not present

## 2023-06-21 DIAGNOSIS — D509 Iron deficiency anemia, unspecified: Secondary | ICD-10-CM | POA: Diagnosis not present

## 2023-06-21 DIAGNOSIS — N186 End stage renal disease: Secondary | ICD-10-CM | POA: Diagnosis not present

## 2023-06-21 DIAGNOSIS — R2689 Other abnormalities of gait and mobility: Secondary | ICD-10-CM | POA: Diagnosis not present

## 2023-06-21 DIAGNOSIS — R262 Difficulty in walking, not elsewhere classified: Secondary | ICD-10-CM | POA: Insufficient documentation

## 2023-06-21 DIAGNOSIS — E039 Hypothyroidism, unspecified: Secondary | ICD-10-CM | POA: Diagnosis not present

## 2023-06-21 DIAGNOSIS — E1043 Type 1 diabetes mellitus with diabetic autonomic (poly)neuropathy: Secondary | ICD-10-CM | POA: Diagnosis not present

## 2023-06-21 DIAGNOSIS — E063 Autoimmune thyroiditis: Secondary | ICD-10-CM | POA: Diagnosis not present

## 2023-06-21 NOTE — Addendum Note (Signed)
Addended byWilhemena Durie S on: 06/21/2023 03:13 PM   Modules accepted: Orders

## 2023-06-21 NOTE — Therapy (Signed)
OUTPATIENT PHYSICAL THERAPY TREATMENT Progress Note Reporting Period 1/9//25 to 06/21/23  See note below for Objective Data and Assessment of Progress/Goals.       Patient Name: Lindsey Murillo MRN: [id number] DOB:Apr 19, 1996, 28 y.o., female Today's Date: 06/21/2023  END OF SESSION:  PT End of Session - 06/21/23 1313     Visit Number 4    Number of Visits 12    Date for PT Re-Evaluation 07/19/23    Authorization Type [employer]; please check auth; request 8 visit    Authorization - Number of Visits 8    PT Start Time 1305    PT Stop Time 1345    PT Time Calculation (min) 40 min    Activity Tolerance Patient tolerated treatment well    Behavior During Therapy WFL for tasks assessed/performed             Past Medical History:  Diagnosis Date   Acanthosis nigricans, acquired    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 chronic kidney disease ([name]) 09/25/2021   dialysis M-W-F at Dialysis at [facility] kidney [facility] in [place]   Asthma    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 10/21/2021   Diabetic autonomic neuropathy ([name])    Diabetic peripheral neuropathy ([name])    Environmental allergies    Goiter    History of blood transfusion    Hypertension    no meds currently   Hypoglycemia associated with diabetes ([name])    Musculoskeletal pain 04/20/2022   Tachycardia    Thyroiditis, autoimmune    Type 1 diabetes mellitus in patient age 82-19 years with HbA1C goal below 7.5    Past Surgical History:  Procedure Laterality Date   AV FISTULA PLACEMENT Right 12/08/2021   Procedure: ARTERIOVENOUS  FISTULA CREATION VERSUS GRAFT;  Surgeon: Larina Earthly, MD;  Location: [facility];  Service: Vascular;  Laterality: Right;   BASCILIC VEIN TRANSPOSITION Right 01/12/2022   Procedure: RIGHT ARM SECOND STAGE BASILIC VEIN TRANSPOSITION;  Surgeon: Larina Earthly, MD;  Location: [facility];  Service: Vascular;  Laterality: Right;   BIOPSY  08/27/2016   Procedure: BIOPSY;  Surgeon: West Bali, MD;  Location: [facility];  Service: Endoscopy;;  duodenum; gastric   BIOPSY  06/27/2019   Procedure: BIOPSY;  Surgeon: Corbin Ade, MD;  Location: [facility];  Service: Endoscopy;;   BIOPSY  02/09/2022   Procedure: BIOPSY;  Surgeon: Lanelle Bal, DO;  Location: [facility];  Service: Endoscopy;;   BREAST CYST EXCISION Right 11/12/2021   Procedure: RIGHT BREAST ABSCESS INCISION AND DRAINAGE;  Surgeon: Emelia Loron, MD;  Location: [facility] OR;  Service: General;  Laterality: Right;  LMA   COLONOSCOPY     COLONOSCOPY WITH PROPOFOL N/A 02/09/2022   Procedure: COLONOSCOPY WITH PROPOFOL;  Surgeon: Lanelle Bal, DO;  Location: [facility];  Service: Endoscopy;  Laterality: N/A;  7:30am, asa 3, dialysis pt   ESOPHAGOGASTRODUODENOSCOPY N/A 08/27/2016   Dr. Darrick Penna: mild gastritis. Negative celiac. No obvious source for dyspepsia/diarrhea   ESOPHAGOGASTRODUODENOSCOPY (EGD) WITH PROPOFOL N/A 06/27/2019   rourk: Focal abnormality of the gastric mucosa likely due to trauma (heaving).  Biopsy showed mild gastritis, negative for H. pylori.  Esophageal dilation for history of dysphagia but normal-appearing esophagus.   ESOPHAGOGASTRODUODENOSCOPY (EGD) WITH PROPOFOL N/A 02/09/2022   Procedure: ESOPHAGOGASTRODUODENOSCOPY (EGD) WITH PROPOFOL;  Surgeon: Lanelle Bal, DO;  Location: [facility];  Service: Endoscopy;  Laterality: N/A;   IR FLUORO GUIDE CV LINE RIGHT  10/13/2021   IR FLUORO GUIDE CV LINE RIGHT  10/19/2021   IR US GUIDE VASC ACCESS RIGHT  10/13/2021   IRRIGATION AND DEBRIDEMENT ABSCESS N/A 10/04/2021   Procedure: IRRIGATION AND DEBRIDEMENT NECK ABSCESS;  Surgeon: Axel Filler, MD;  Location: [facility] OR;  Service: General;  Laterality: N/A;   Patient Active Problem List   Diagnosis Date Noted   Insulin long-term use ([name]) 12/23/2022   Amenorrhea, secondary 10/26/2022   Vitamin D deficiency 08/26/2022   Hyperlipidemia 07/21/2022   Non-adherence to medical treatment  01/21/2022   ESRD (end stage renal disease) ([name]) 12/18/2021   Secondary hyperparathyroidism of renal origin ([name]) 10/21/2021   Physical deconditioning 10/07/2021   Depression 09/25/2021   Anemia in ESRD (end-stage renal disease) ([name]) 09/25/2021   Nephrotic syndrome 05/27/2021   Chronic pancreatitis ([name]) 08/22/2019   Pancreatic pseudocyst/cyst 08/22/2019   GERD (gastroesophageal reflux disease) 08/22/2019   Uncontrolled type 1 diabetes mellitus with hyperglycemia ([name]) 05/12/2015   Essential hypertension, benign 11/30/2012   Asthma     PCP: Dr. Everlene Other, DO  REFERRING PROVIDER: Darreld Mclean, MD  REFERRING DIAG: M54.6 (ICD-10-CM) - Pain in thoracic spine  Rationale for Evaluation and Treatment: Rehabilitation  THERAPY DIAG:  Difficulty in walking, not elsewhere classified  Other abnormalities of gait and mobility  Pain in thoracic spine  ONSET DATE: a year ago  SUBJECTIVE:                                                                                                                                                                                           SUBJECTIVE STATEMENT: 06/21/23: Pt just returned following 3 weeks of absence from therapy.  States she has had a bad stomach virus and went to ED twice.  States she's had a dry cough since then.  Currently feeling weak with pain in her shoulder blades.  Reports she is doing self dialysis at home now.     Evaluation: Patient well known to this clinic. Has been on dialysis about 1.5 years now; was here previous episode for gait; balance issues.  Says that is better but she is still using a SPC some.  Sometimes can hurt so bad she has to use RW. Having catheter placed 1/14 for at home dialysis; hopes to be on transplant list soon.   PERTINENT HISTORY:  ESRD dialysis M,W,F; trying to get at home dialysis   PAIN:  Are you having pain? Yes: NPRS scale: 0 to 9; currently 7.5/10 Pain location: between shoulder blades Pain  description: aching, stabbing Aggravating factors: moving Relieving factors: unknown  PRECAUTIONS: Fall   WEIGHT BEARING RESTRICTIONS: No  FALLS:  Has patient fallen in last 6 months? No  OCCUPATION: disability  PLOF: Independent with household mobility with device  PATIENT GOALS: decreased pain and more mobility  NEXT MD VISIT: Hilda Lias 06/02/23  OBJECTIVE:  Note: Objective measures were completed at Evaluation unless otherwise noted.  DIAGNOSTIC FINDINGS:  Clinical:  thoracic spine pain, no trauma   X-rays were done of the thoracic spine, two views.   Alignment of the thoracic spine is good.  No fracture is noted.  Bone quality is good.   Impression:  negative thoracic spine, no acute findings.    PATIENT SURVEYS:  Modified Oswestry 06/21/23:    (evaluation 15/50 30%)   COGNITION: Overall cognitive status: Within functional limits for tasks assessed     SENSATION: Decreased to light touch bilaterally    POSTURE: rounded shoulders and forward head  PALPATION: Mid back tenderness  LUMBAR ROM:   AROM eval  Flexion full  Extension 20% available Tends to lose balance leaning backwards  Right lateral flexion   Left lateral flexion   Right rotation   Left rotation    (Blank rows = not tested)  LOWER EXTREMITY ROM:     Active  Right eval Left eval  Hip flexion    Hip extension    Hip abduction    Hip adduction    Hip internal rotation    Hip external rotation    Knee flexion    Knee extension    Ankle dorsiflexion    Ankle plantarflexion    Ankle inversion    Ankle eversion     (Blank rows = not tested)  LOWER EXTREMITY MMT:    MMT Right eval Left eval Right 06/21/23 Left 06/21/23  Hip flexion 4 4- 4 4-  Hip extension      Hip abduction      Hip adduction      Hip internal rotation      Hip external rotation      Knee flexion      Knee extension 5 5 5 5   Ankle dorsiflexion Limited range 3- Limited range 3- 3- 3-  Ankle  plantarflexion      Ankle inversion      Ankle eversion       (Blank rows = not tested)    FUNCTIONAL TESTS:  5 times sit to stand: 06/21/23: 50.81 sec   evaluation 05/19/23: 22.48 sec using hands on thighs to push up   GAIT:  :  2/11:  110 feet with SPC Distance walked: (evaluation 50 ft in clinic) Assistive device utilized: Single point cane Level of assistance: Modified independence Comments: decreased gait speed; increased lateral trunk moveent  TREATMENT DATE:  06/21/23 Re-evaluation 5X STS  50.81 (was 22.48 at evaluation on 05/19/23)   110 feet with SPC MMT (see above, no change) Modified oswestry 13/50, 26% Review of HEP, POC    06/02/23 Seated  Thoracic extension x 10 Scapular retraction 5" x 10 Sit to stand x 5 with hands pushing up on knees Thoracic rotation x 10 each Shoulder external rotation bilaterally yellow theraband 2 x 10 Scapular retraction yellow theraband 2 x 10 UBE  backwards x 2'; forwards x 2' Shoulder shrugs up, back and down x 10 Wall push ups 2 x 10 Updated HEP    05/31/23 Goal review/POC moving forward HEP review: scap retraction 10X, thoracic extension 10X in chair, STS 10X no UE Standing:  RTB rows 10X  RTB shoulder extensions 10X  05/19/23 physical therapy evaluation and  HEP instruction                                                                                                                                 PATIENT EDUCATION:  Education details: Patient educated on exam findings, POC, scope of PT, HEP, and what to expect next visit. Person educated: Patient Education method: Explanation, Demonstration, and Handouts Education comprehension: verbalized understanding, returned demonstration, verbal cues required, and tactile cues required  HOME EXERCISE PROGRAM:  06/02/23 Seated Bilateral Shoulder External Rotation with Resistance  - 2 x daily - 7 x weekly - 2 sets - 10 reps - Scapular Retraction with Resistance  - 2 x daily  - 7 x weekly - 2 sets - 10 reps Access Code: [email or web address] URL: [email or web address] Date: 05/19/2023 Prepared by: [facility]  Exercises - Sit to Stand  - 2 x daily - 7 x weekly - 2 sets - 5 reps - Seated Thoracic Lumbar Extension  - 2 x daily - 7 x weekly - 1 sets - 10 reps - Seated Scapular Retraction  - 2 x daily - 7 x weekly - 1 sets - 10 reps - 5" hold  ASSESSMENT:  CLINICAL IMPRESSION: Re-evaluation/PROGRESS NOTE:  Returns today following 3 weeks since last appointment.  Admits to mostly napping/sleeping in between completing self-dialysis every 6 houurs.  Noted weakness and fatigue upon return exhibited by her mobility and repeated test measures this session.  States she also has gastritis as well. Encouraged to increase her activity at home and resume HEP.  Pt has not met goals thus far and will continue to benefit from skilled therapy services to address deficits and promote return to optimal function.     Evaluation:  Patient is a 28 y.o. female who was seen today for physical therapy evaluation and treatment for M54.6 (ICD-10-CM) - Pain in thoracic spine.  Patient demonstrates muscle weakness, reduced ROM, and fascial restrictions which are likely contributing to symptoms of pain and are negatively impacting patient ability to perform ADLs and functional mobility tasks. Patient will benefit from skilled physical therapy services to address these deficits to reduce pain and improve level of function with ADLs and functional mobility tasks.   OBJECTIVE IMPAIRMENTS: Abnormal gait, decreased activity tolerance, decreased balance, difficulty walking, decreased ROM, decreased strength, hypomobility, increased fascial restrictions, and pain.   ACTIVITY LIMITATIONS: bending, sitting, standing, transfers, bed mobility, and locomotion level  PARTICIPATION LIMITATIONS: meal prep, cleaning, laundry, and community activity  PERSONAL FACTORS: 1-2 comorbidities: DM, ESRD  are also  affecting patient's functional outcome.   REHAB POTENTIAL: Good  CLINICAL DECISION MAKING: Evolving/moderate complexity  EVALUATION COMPLEXITY: Moderate   GOALS: Goals reviewed with patient? Yes  SHORT TERM GOALS: Target date: 06/02/2023  patient will be independent with initial HEP  Baseline: Goal status: IN PROGRESS  2.  Patient will self report 30% improvement to improve tolerance for functional activity  Baseline:  Goal status: IN PROGRESS  LONG TERM GOALS: Target date: 06/17/2023  Patient will be independent in self management strategies to improve quality of life and functional outcomes.  Baseline:  Goal status: IN PROGRESS  2.  Patient will self report 50% improvement to improve tolerance for functional activity  Baseline:  Goal status: IN PROGRESS  3.  Patient will improve 5 times sit to stand score from 22.48 sec to 18 sec to demonstrate improved functional mobility and increased lower extremity strength.   Baseline:  Goal status: IN PROGRESS  4.  Patient will be able to stand/walk x 1 hour to shop without back pain > 2/10 Baseline:  Goal status: IN PROGRESS  5.  Patient will improve Modified Oswestry Score by 5 points  to demonstrate improved perceived functional mobility Baseline:  Goal status: IN PROGRESS PLAN:  PT FREQUENCY: 2x/week  PT DURATION: 4 weeks  PLANNED INTERVENTIONS: 97164- PT Re-evaluation, 97110-Therapeutic exercises, 97530- Therapeutic activity, 97112- Neuromuscular re-education, 97535- Self Care, 16109- Manual therapy, [phone]- Gait training, [phone]- Orthotic Fit/training, [phone]- Canalith repositioning, [id number]- Aquatic Therapy, [phone]- Splinting, Patient/Family education, Balance training, Stair training, Taping, Dry Needling, Joint mobilization, Joint manipulation, Spinal manipulation, Spinal mobilization, Scar mobilization, and DME instructions. Marland Kitchen  PLAN FOR NEXT SESSION: Encouraged to resume her HEP and progress her activity at home.   Request additional visits to progress towards goals.  continue to improve postural strengthening; thoracic mobility.    Lurena Nida, PTA/CLT [facility] Ph: [phone]  1:50 PM, 06/21/23

## 2023-06-22 DIAGNOSIS — E063 Autoimmune thyroiditis: Secondary | ICD-10-CM | POA: Diagnosis not present

## 2023-06-22 DIAGNOSIS — E1022 Type 1 diabetes mellitus with diabetic chronic kidney disease: Secondary | ICD-10-CM | POA: Diagnosis not present

## 2023-06-22 DIAGNOSIS — D509 Iron deficiency anemia, unspecified: Secondary | ICD-10-CM | POA: Diagnosis not present

## 2023-06-22 DIAGNOSIS — N186 End stage renal disease: Secondary | ICD-10-CM | POA: Diagnosis not present

## 2023-06-22 DIAGNOSIS — E039 Hypothyroidism, unspecified: Secondary | ICD-10-CM | POA: Diagnosis not present

## 2023-06-22 DIAGNOSIS — Z992 Dependence on renal dialysis: Secondary | ICD-10-CM | POA: Diagnosis not present

## 2023-06-22 DIAGNOSIS — E1043 Type 1 diabetes mellitus with diabetic autonomic (poly)neuropathy: Secondary | ICD-10-CM | POA: Diagnosis not present

## 2023-06-22 DIAGNOSIS — D631 Anemia in chronic kidney disease: Secondary | ICD-10-CM | POA: Diagnosis not present

## 2023-06-22 DIAGNOSIS — Z4932 Encounter for adequacy testing for peritoneal dialysis: Secondary | ICD-10-CM | POA: Diagnosis not present

## 2023-06-22 DIAGNOSIS — N2581 Secondary hyperparathyroidism of renal origin: Secondary | ICD-10-CM | POA: Diagnosis not present

## 2023-06-23 ENCOUNTER — Ambulatory Visit (INDEPENDENT_AMBULATORY_CARE_PROVIDER_SITE_OTHER): Payer: 59 | Admitting: Nurse Practitioner

## 2023-06-23 ENCOUNTER — Encounter: Payer: Self-pay | Admitting: Nurse Practitioner

## 2023-06-23 ENCOUNTER — Encounter (HOSPITAL_COMMUNITY): Payer: 59

## 2023-06-23 VITALS — BP 110/78 | HR 89 | Ht 68.0 in | Wt 125.0 lb

## 2023-06-23 DIAGNOSIS — D631 Anemia in chronic kidney disease: Secondary | ICD-10-CM | POA: Diagnosis not present

## 2023-06-23 DIAGNOSIS — I10 Essential (primary) hypertension: Secondary | ICD-10-CM

## 2023-06-23 DIAGNOSIS — E063 Autoimmune thyroiditis: Secondary | ICD-10-CM | POA: Diagnosis not present

## 2023-06-23 DIAGNOSIS — Z794 Long term (current) use of insulin: Secondary | ICD-10-CM

## 2023-06-23 DIAGNOSIS — E782 Mixed hyperlipidemia: Secondary | ICD-10-CM | POA: Diagnosis not present

## 2023-06-23 DIAGNOSIS — E039 Hypothyroidism, unspecified: Secondary | ICD-10-CM | POA: Diagnosis not present

## 2023-06-23 DIAGNOSIS — N2581 Secondary hyperparathyroidism of renal origin: Secondary | ICD-10-CM | POA: Diagnosis not present

## 2023-06-23 DIAGNOSIS — Z992 Dependence on renal dialysis: Secondary | ICD-10-CM

## 2023-06-23 DIAGNOSIS — E1022 Type 1 diabetes mellitus with diabetic chronic kidney disease: Secondary | ICD-10-CM | POA: Diagnosis not present

## 2023-06-23 DIAGNOSIS — E1043 Type 1 diabetes mellitus with diabetic autonomic (poly)neuropathy: Secondary | ICD-10-CM | POA: Diagnosis not present

## 2023-06-23 DIAGNOSIS — E559 Vitamin D deficiency, unspecified: Secondary | ICD-10-CM | POA: Diagnosis not present

## 2023-06-23 DIAGNOSIS — Z4932 Encounter for adequacy testing for peritoneal dialysis: Secondary | ICD-10-CM | POA: Diagnosis not present

## 2023-06-23 DIAGNOSIS — N186 End stage renal disease: Secondary | ICD-10-CM

## 2023-06-23 DIAGNOSIS — D509 Iron deficiency anemia, unspecified: Secondary | ICD-10-CM | POA: Diagnosis not present

## 2023-06-23 LAB — POCT GLYCOSYLATED HEMOGLOBIN (HGB A1C): Hemoglobin A1C: 7.2 % — AB (ref 4.0–5.6)

## 2023-06-23 MED ORDER — TRESIBA FLEXTOUCH 100 UNIT/ML ~~LOC~~ SOPN
15.0000 [IU] | PEN_INJECTOR | Freq: Every day | SUBCUTANEOUS | 3 refills | Status: DC
Start: 2023-06-23 — End: 2023-07-20

## 2023-06-23 MED ORDER — INSULIN LISPRO (1 UNIT DIAL) 100 UNIT/ML (KWIKPEN)
7.0000 [IU] | PEN_INJECTOR | Freq: Three times a day (TID) | SUBCUTANEOUS | 3 refills | Status: DC
Start: 2023-06-23 — End: 2023-07-04

## 2023-06-23 NOTE — Progress Notes (Signed)
06/23/2023, 4:42 PM  Endocrinology follow-up note   Subjective:    Patient ID: Lindsey Murillo, female    DOB: 25-May-1995.  Lindsey Murillo is being seen in follow-up after she was seen in consultation for management of currently uncontrolled symptomatic diabetes requested by  [name], DO.   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 10/21/2021   Diabetic autonomic neuropathy ([name])    Diabetic peripheral neuropathy ([name])    Environmental allergies    Goiter    History of blood transfusion    Hypertension    no meds currently   Hypoglycemia associated with diabetes ([name])    Musculoskeletal pain 04/20/2022   Tachycardia    Thyroiditis, autoimmune    Type 1 diabetes mellitus in patient age 66-19 years with HbA1C goal below 7.5     Past Surgical History:  Procedure Laterality Date   AV FISTULA PLACEMENT Right 12/08/2021   Procedure: ARTERIOVENOUS  FISTULA CREATION VERSUS GRAFT;  Surgeon: Larina Earthly, MD;  Location: [facility];  Service: Vascular;  Laterality: Right;   BASCILIC VEIN TRANSPOSITION Right 01/12/2022   Procedure: RIGHT ARM SECOND STAGE BASILIC VEIN TRANSPOSITION;  Surgeon: Larina Earthly, MD;  Location: [facility];  Service: Vascular;  Laterality: Right;   BIOPSY  08/27/2016   Procedure: BIOPSY;  Surgeon: West Bali, MD;  Location: [facility];  Service: Endoscopy;;  duodenum; gastric   BIOPSY  06/27/2019   Procedure: BIOPSY;  Surgeon: Corbin Ade, MD;  [facility]: [facility];  Service: Endoscopy;;   BIOPSY  02/09/2022   Procedure: BIOPSY;  Surgeon: Lanelle Bal, DO;  [facility];  Service: Endoscopy;;   BREAST CYST  EXCISION Right 11/12/2021   Procedure: RIGHT BREAST ABSCESS INCISION AND DRAINAGE;  Surgeon: Emelia Loron, MD;  Location: [facility] OR;  Service: General;  Laterality: Right;  LMA   COLONOSCOPY     COLONOSCOPY WITH PROPOFOL N/A 02/09/2022   Procedure: COLONOSCOPY WITH PROPOFOL;  Surgeon: Lanelle Bal, DO;  [facility];  Service: Endoscopy;  Laterality: N/A;  7:30am, asa 3, dialysis pt   ESOPHAGOGASTRODUODENOSCOPY N/A 08/27/2016   Dr. Darrick Penna: mild gastritis. Negative celiac. No obvious source for dyspepsia/diarrhea   ESOPHAGOGASTRODUODENOSCOPY (EGD) WITH PROPOFOL N/A 06/27/2019   rourk: Focal abnormality of the gastric mucosa likely due to trauma (heaving).  Biopsy showed mild gastritis, negative for H. pylori.  Esophageal dilation for history of dysphagia but normal-appearing esophagus.   ESOPHAGOGASTRODUODENOSCOPY (EGD) WITH PROPOFOL N/A 02/09/2022   Procedure: ESOPHAGOGASTRODUODENOSCOPY (EGD) WITH PROPOFOL;  Surgeon: Lanelle Bal, DO;  [facility];  Service: Endoscopy;  Laterality: N/A;   IR FLUORO GUIDE CV LINE RIGHT  10/13/2021   IR FLUORO GUIDE CV LINE RIGHT  10/19/2021   IR US GUIDE VASC ACCESS RIGHT  10/13/2021   IRRIGATION AND DEBRIDEMENT ABSCESS  N/A 10/04/2021   Procedure: IRRIGATION AND DEBRIDEMENT NECK ABSCESS;  Surgeon: Axel Filler, MD;  Location: [facility] OR;  Service: General;  Laterality: N/A;    Social History   Socioeconomic History   Marital status: Single    Spouse name: Not on file   Number of children: 0   Years of education: Not on file   Highest education level: High school graduate  Occupational History   Occupation: unemployed  Tobacco Use   Smoking status: Never    Passive exposure: Never   Smokeless tobacco: Never  Vaping Use   Vaping status: Never Used  Substance and Sexual Activity   Alcohol use: No   Drug use: No   Sexual activity: Not Currently    Birth control/protection: Abstinence, None  Other Topics Concern   Not on file   Social History Narrative   Not on file   Social Drivers of Health   Financial Resource Strain: Low Risk  (04/28/2023)   Received from [facility]   Overall Financial Resource Strain (CARDIA)    Difficulty of Paying Living Expenses: Not hard at all  Food Insecurity: No Food Insecurity (04/28/2023)   Received from [facility]   Hunger Vital Sign    Worried About Running Out of Food in the Last Year: Never true    Ran Out of Food in the Last Year: Never true  Transportation Needs: No Transportation Needs (04/28/2023)   Received from [facility] - Transportation    Lack of Transportation (Medical): No    Lack of Transportation (Non-Medical): No  Physical Activity: Insufficiently Active (01/14/2023)   Exercise Vital Sign    Days of Exercise per Week: 4 days    Minutes of Exercise per Session: 30 min  Stress: No Stress Concern Present (05/24/2023)   Received from [facility] - Occupational Stress Questionnaire    Feeling of Stress : Only a little  Social Connections: Moderately Isolated (01/14/2023)   Social Connection and Isolation Panel [NHANES]    Frequency of Communication with Friends and Family: More than three times a week    Frequency of Social Gatherings with Friends and Family: More than three times a week    Attends Religious Services: More than 4 times per year    Active Member of [employer] or Organizations: No    Attends Banker Meetings: Never    Marital Status: Never married    Family History  Problem Relation Age of Onset   Alcohol abuse Paternal Grandfather    Huntington's disease Maternal Grandfather    Cancer Father        pancreatic   Diabetes Mother        Type II DM   Colon cancer Neg Hx    Colon polyps Neg Hx     Outpatient Encounter Medications as of 06/23/2023  Medication Sig   acetaminophen (TYLENOL) 325 MG tablet Take by mouth.   B Complex-C-Folic Acid (DIALYVITE 800) 0.8 MG TABS  Take 1 tablet by mouth daily.   cetirizine HCl (ZYRTEC) 5 MG/5ML SOLN TAKE 1.25 ML TWICE A DAY AS NEEDED FOR A RUNNY NOSE OR ITCH   Continuous Glucose Receiver (DEXCOM G7 RECEIVER) DEVI by Does not apply route.   Continuous Glucose Sensor (DEXCOM G7 SENSOR) MISC by Does not apply route.   fluticasone (FLONASE) 50 MCG/ACT nasal spray Place 2 sprays into both nostrils daily as needed for allergies or rhinitis.   Fluticasone Furoate (  ARNUITY ELLIPTA) 100 MCG/ACT AEPB 1 puff 1(ONE) Time per day to prevent cough or wheeze.   gentamicin cream (GARAMYCIN) 0.1 % Apply 1 Application topically daily.   medroxyPROGESTERone (PROVERA) 10 MG tablet Take provera 10 mg for 10 days every 3 months   Olopatadine HCl (PATADAY) 0.2 % SOLN 1 drop in each eye 1(ONE) Time per day as needed for red, itchy eyes.   Olopatadine-Mometasone (RYALTRIS) [id number] MCG/ACT SUSP 2 sprays each nostril 2(TWO) Times a day as needed for runny or stuffy nose.   ondansetron (ZOFRAN-ODT) 4 MG disintegrating tablet Take 1 tablet (4 mg total) by mouth every 8 (eight) hours as needed for nausea or vomiting.   pantoprazole (PROTONIX) 40 MG tablet Take 1 tablet (40 mg total) by mouth 2 (two) times daily before a meal.   rosuvastatin (CRESTOR) 10 MG tablet Take 1 tablet (10 mg total) by mouth daily.   scopolamine (TRANSDERM-SCOP) 1 MG/3DAYS Place 1 patch (1.5 mg total) onto the skin every 3 (three) days.   SENSIPAR 30 MG tablet Take 30 mg by mouth.   SSD 1 % cream APPLY CREAM TO WOUND DAILY AND COVER WITH GAUZE DRESSING   sucroferric oxyhydroxide (VELPHORO) 500 MG chewable tablet Chew 500 mg by mouth 3 (three) times daily with meals.   VENTOLIN HFA 108 (90 Base) MCG/ACT inhaler Inhale 2 puffs into the lungs every 4 (four) hours as needed for wheezing or shortness of breath. 2 puffs once every 4 hours as needed for cough, wheeze, shortness of breath and chest tightness. May use 2 puffs 5 to 15 minutes before activity to decrease cough or wheeze.    Vitamin D, Ergocalciferol, (DRISDOL) 1.25 MG (50000 UNIT) CAPS capsule Take 50,000 Units by mouth every 7 (seven) days.   [DISCONTINUED] insulin lispro (HUMALOG KWIKPEN) 100 UNIT/ML KwikPen Inject 5-8 Units into the skin 3 (three) times daily.   [DISCONTINUED] TRESIBA FLEXTOUCH 100 UNIT/ML FlexTouch Pen Inject 15 Units into the skin at bedtime.   Cinacalcet HCl (SENSIPAR PO) Take by mouth. (Patient not taking: Reported on 06/23/2023)   insulin lispro (HUMALOG KWIKPEN) 100 UNIT/ML KwikPen Inject 7-10 Units into the skin 3 (three) times daily.   TRESIBA FLEXTOUCH 100 UNIT/ML FlexTouch Pen Inject 15 Units into the skin at bedtime.   No facility-administered encounter medications on file as of 06/23/2023.    ALLERGIES: No Known Allergies  VACCINATION STATUS: Immunization History  Administered Date(s) Administered   Hepb-cpg 10/30/2021, 11/25/2021, 12/23/2021, 02/24/2022   Influenza,inj,Quad PF,6+ Mos 03/13/2013, 02/25/2014, 04/09/2016, 01/26/2017, 01/18/2019, 06/15/2021, 02/08/2022   PNEUMOCOCCAL CONJUGATE-20 10/28/2021   Td 04/15/2017   Td (Adult),5 Lf Tetanus Toxid, Preservative Free 04/15/2017    Diabetes She presents for her follow-up diabetic visit. She has type 1 diabetes mellitus. Her disease course has been worsening. There are no hypoglycemic associated symptoms. Pertinent negatives for diabetes include no polydipsia and no polyuria. There are no hypoglycemic complications. Symptoms are improving. Diabetic complications include nephropathy and peripheral neuropathy. (Non adherence to medical treatment.) Risk factors for coronary artery disease include diabetes mellitus, dyslipidemia and sedentary lifestyle. Current diabetic treatment includes intensive insulin program. She is compliant with treatment most of the time. Her weight is fluctuating minimally. She is following a generally unhealthy diet. When asked about meal planning, she reported none. She never participates in exercise. Her  home blood glucose trend is increasing steadily. Her overall blood glucose range is 140-180 mg/dl. (She presents today with her CGM showing slightly above target glycemic profile.  Her POCT A1c today  is 7.2%, improving from last visit of 7.9%.  She notes she recently started PD at home and has noticed her glucose trending up.  Analysis of her CGM shots TIR 56%, TAR 42%, TBR 2% with a GMi of 7.5%. ) An ACE inhibitor/angiotensin II receptor blocker is being taken.  Hyperlipidemia This is a chronic problem. Exacerbating diseases include diabetes. Current antihyperlipidemic treatment includes statins. Risk factors for coronary artery disease include dyslipidemia, diabetes mellitus, hypertension and a sedentary lifestyle.  Hypertension This is a chronic problem. Risk factors for coronary artery disease include dyslipidemia and diabetes mellitus. Past treatments include ACE inhibitors.    Review of systems  Constitutional: + Minimally fluctuating body weight,  current Body mass index is 19.01 kg/m. , no fatigue, no subjective hyperthermia, no subjective hypothermia Eyes: + blurry vision, no xerophthalmia ENT: no sore throat, no nodules palpated in throat, no dysphagia/odynophagia, no hoarseness Cardiovascular: no chest pain, no shortness of breath, no palpitations, no leg swelling Respiratory: no cough, no shortness of breath Gastrointestinal: no nausea/vomiting/diarrhea Musculoskeletal: walks with cane Skin: no rashes, no hyperemia Neurological: no tremors, no numbness, no tingling, no dizziness Psychiatric: no depression, no anxiety   Objective:    BP 110/78 (BP Location: Left Arm, Patient Position: Sitting, Cuff Size: Large)   Pulse 89   Ht 5\' 8"  (1.727 m)   Wt 125 lb (56.7 kg)   BMI 19.01 kg/m   Wt Readings from Last 3 Encounters:  06/23/23 125 lb (56.7 kg)  06/08/23 107 lb (48.5 kg)  04/21/23 116 lb (52.6 kg)    BP Readings from Last 3 Encounters:  06/23/23 110/78  06/08/23 (!)  152/107  06/02/23 112/72     Physical Exam- Limited  Constitutional:  Body mass index is 19.01 kg/m. , not in acute distress, mildly anxious/distracted state of mind Eyes:  EOMI, no exophthalmos Musculoskeletal: walks with cane Skin:  no rashes, no hyperemia Neurological: no tremor with outstretched hands   Diabetic Foot Exam - Simple   No data filed      CMP ( most recent) CMP     Component Value Date/Time   NA 135 06/08/2023 0303   NA 132 (L) 09/24/2021 1453   K 3.5 06/08/2023 0303   CL 89 (L) 06/08/2023 0303   CO2 25 06/08/2023 0303   GLUCOSE 98 06/08/2023 0303   BUN 55 (H) 06/08/2023 0303   BUN 36 (H) 09/24/2021 1453   CREATININE 10.07 (H) 06/08/2023 0303   CREATININE 0.98 08/29/2019 1637   CALCIUM 9.0 06/08/2023 0303   PROT 6.9 06/08/2023 0303   PROT 5.5 (L) 09/24/2021 1453   ALBUMIN 3.0 (L) 06/08/2023 0303   ALBUMIN 2.1 (L) 09/24/2021 1453   AST 11 (L) 06/08/2023 0303   ALT 10 06/08/2023 0303   ALKPHOS 119 06/08/2023 0303   BILITOT 1.3 (H) 06/08/2023 0303   BILITOT <0.2 09/24/2021 1453   GFRNONAA 5 (L) 06/08/2023 0303   GFRAA >60 10/27/2019 2021     Diabetic Labs (most recent): Lab Results  Component Value Date   HGBA1C 7.2 (A) 06/23/2023   HGBA1C 10.5 (H) 12/14/2022   HGBA1C 10.5 (H) 07/20/2022   MICROALBUR 1,195.2 (H) 06/13/2021   MICROALBUR 0.50 11/28/2012   MICROALBUR 0.50 07/20/2012     Lipid Panel ( most recent) Lipid Panel     Component Value Date/Time   CHOL 190 07/20/2022 1440   TRIG 500 (H) 07/20/2022 1440   HDL 73 07/20/2022 1440   CHOLHDL 2.6 07/20/2022 1440   CHOLHDL  2.0 11/05/2015 1252   VLDL 12 11/05/2015 1252   LDLCALC 46 07/20/2022 1440   LABVLDL 71 (H) 07/20/2022 1440      Lab Results  Component Value Date   TSH 1.060 10/26/2022   TSH 0.975 10/03/2021   TSH 2.520 05/26/2021   TSH 1.802 05/14/2021   TSH 0.342 (L) 05/27/2018   TSH 0.55 11/05/2015   TSH 1.175 11/28/2012   TSH 0.706 07/20/2012   TSH 1.008  08/26/2011   FREET4 1.19 10/26/2022   FREET4 1.4 11/05/2015   FREET4 1.26 11/28/2012   FREET4 1.44 07/20/2012   FREET4 1.23 08/26/2011      Assessment & Plan:   1) 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  She presents today with her CGM showing slightly above target glycemic profile.  Her POCT A1c today is 7.2%, improving from last visit of 7.9%.  She notes she recently started PD at home and has noticed her glucose trending up.  Analysis of her CGM shots TIR 56%, TAR 42%, TBR 2% with a GMi of 7.5%.   - I had a long discussion with her about the progressive nature of diabetes and the pathology behind its complications. -her diabetes is complicated by peripheral neuropathy, ESRD, noncompliance/nonadherence, peripheral ? Lymphedema she remains at a high risk for more acute and chronic complications which include CAD, CVA, CKD, retinopathy, and neuropathy. These are all discussed in detail with her.  - Nutritional counseling repeated at each appointment due to patients tendency to fall back in to old habits.  - The patient admits there is a room for improvement in their diet and drink choices. -  Suggestion is made for the patient to avoid simple carbohydrates from their diet including Cakes, Sweet Desserts / Pastries, Ice Cream, Soda (diet and regular), Sweet Tea, Candies, Chips, Cookies, Sweet Pastries, Store Bought Juices, Alcohol in Excess of 1-2 drinks a day, Artificial Sweeteners, Coffee Creamer, and "Sugar-free" Products. This will help patient to have stable blood glucose profile and potentially avoid unintended weight gain.   - I encouraged the patient to switch to unprocessed or minimally processed complex starch and increased protein intake (animal or plant source), fruits, and vegetables.   - Patient is advised to stick to a routine mealtimes to eat 3 meals a day and avoid  unnecessary snacks (to snack only to correct hypoglycemia).  - I have approached her with the following plan to manage  her diabetes and patient agrees:   -She is advised to continue 15 units SQ nightly and increase her Novolog to 7-10 units TID with meals if glucose is above 90 and she is eating (Specific instructions on how to titrate insulin dosage based on glucose readings given to patient in writing).   She is advised to monitor glucose 4 times daily (using her CGM) and call the clinic if she has readings less than 70 or above 200 for 3 tests in a row.    -We did talk about possibility of insulin pump in the future.  I encouraged her to research the different ones and we can discuss further on subsequent visits.  - she is warned not to take insulin without proper monitoring per orders. - She does not have a suitable , non insulin option to treat her diabetes.  - Specific targets for  A1c;  LDL, HDL,  and Triglycerides were discussed with the patient.  2) Blood Pressure /Hypertension:  -Her blood pressure is controlled to target without the use of antihypertensives.  Changes will be deferred to nephrology.  3) Lipids/Hyperlipidemia:  Her recent lipid panel from 07/20/22 shows controlled LDL of 46 and significantly elevated triglycerides of 500.  she is advised to continue Lipitor 10 mg p.o. daily at bedtime.  Side effects and precautions discussed with her.  4)  Weight/Diet:  Body mass index is 19.01 kg/m.   She is not a candidate for weight loss.    5) Chronic Care/Health Maintenance: -she is not on ACEI/ARB and is on Statin medications and  is encouraged to initiate and continue to follow up with Ophthalmology, Dentist,  Podiatrist at least yearly or according to recommendations, and advised to  stay away from smoking. I have recommended yearly flu vaccine and pneumonia vaccine at least every 5 years; moderate intensity exercise for up to 150 minutes weekly; and  sleep for 7- 9 hours a  day.  - she is  advised to maintain close follow up with [name], DO for primary care needs, as well as her other providers for optimal and coordinated care.     I spent  43  minutes in the care of the patient today including review of labs from CMP, Lipids, Thyroid Function, Hematology (current and previous including abstractions from other facilities); face-to-face time discussing  her blood glucose readings/logs, discussing hypoglycemia and hyperglycemia episodes and symptoms, medications doses, her options of short and long term treatment based on the latest standards of care / guidelines;  discussion about incorporating lifestyle medicine;  and documenting the encounter. Risk reduction counseling performed per USPSTF guidelines to reduce obesity and cardiovascular risk factors.     Please refer to Patient Instructions for Blood Glucose Monitoring and Insulin/Medications Dosing Guide"  in media tab for additional information. Please  also refer to " Patient Self Inventory" in the Media  tab for reviewed elements of pertinent patient history.  [employer] participated in the discussions, expressed understanding, and voiced agreement with the above plans.  All questions were answered to her satisfaction. she is encouraged to contact clinic should she have any questions or concerns prior to her return visit.   Follow up plan: - Return in about 3 months (around 09/20/2023) for Diabetes F/U with A1c in office, No previsit labs, Bring meter and logs.   Ronny Bacon, [facility] [address] [place], [place] [address] Phone: [phone] Fax: [phone]

## 2023-06-24 DIAGNOSIS — E1043 Type 1 diabetes mellitus with diabetic autonomic (poly)neuropathy: Secondary | ICD-10-CM | POA: Diagnosis not present

## 2023-06-24 DIAGNOSIS — Z4932 Encounter for adequacy testing for peritoneal dialysis: Secondary | ICD-10-CM | POA: Diagnosis not present

## 2023-06-24 DIAGNOSIS — Z992 Dependence on renal dialysis: Secondary | ICD-10-CM | POA: Diagnosis not present

## 2023-06-24 DIAGNOSIS — E1022 Type 1 diabetes mellitus with diabetic chronic kidney disease: Secondary | ICD-10-CM | POA: Diagnosis not present

## 2023-06-24 DIAGNOSIS — E039 Hypothyroidism, unspecified: Secondary | ICD-10-CM | POA: Diagnosis not present

## 2023-06-24 DIAGNOSIS — E063 Autoimmune thyroiditis: Secondary | ICD-10-CM | POA: Diagnosis not present

## 2023-06-24 DIAGNOSIS — N186 End stage renal disease: Secondary | ICD-10-CM | POA: Diagnosis not present

## 2023-06-24 DIAGNOSIS — D631 Anemia in chronic kidney disease: Secondary | ICD-10-CM | POA: Diagnosis not present

## 2023-06-24 DIAGNOSIS — D509 Iron deficiency anemia, unspecified: Secondary | ICD-10-CM | POA: Diagnosis not present

## 2023-06-24 DIAGNOSIS — N2581 Secondary hyperparathyroidism of renal origin: Secondary | ICD-10-CM | POA: Diagnosis not present

## 2023-06-25 DIAGNOSIS — N186 End stage renal disease: Secondary | ICD-10-CM | POA: Diagnosis not present

## 2023-06-25 DIAGNOSIS — E039 Hypothyroidism, unspecified: Secondary | ICD-10-CM | POA: Diagnosis not present

## 2023-06-25 DIAGNOSIS — Z4932 Encounter for adequacy testing for peritoneal dialysis: Secondary | ICD-10-CM | POA: Diagnosis not present

## 2023-06-25 DIAGNOSIS — E1043 Type 1 diabetes mellitus with diabetic autonomic (poly)neuropathy: Secondary | ICD-10-CM | POA: Diagnosis not present

## 2023-06-25 DIAGNOSIS — D509 Iron deficiency anemia, unspecified: Secondary | ICD-10-CM | POA: Diagnosis not present

## 2023-06-25 DIAGNOSIS — N2581 Secondary hyperparathyroidism of renal origin: Secondary | ICD-10-CM | POA: Diagnosis not present

## 2023-06-25 DIAGNOSIS — Z992 Dependence on renal dialysis: Secondary | ICD-10-CM | POA: Diagnosis not present

## 2023-06-25 DIAGNOSIS — E1022 Type 1 diabetes mellitus with diabetic chronic kidney disease: Secondary | ICD-10-CM | POA: Diagnosis not present

## 2023-06-25 DIAGNOSIS — E063 Autoimmune thyroiditis: Secondary | ICD-10-CM | POA: Diagnosis not present

## 2023-06-25 DIAGNOSIS — D631 Anemia in chronic kidney disease: Secondary | ICD-10-CM | POA: Diagnosis not present

## 2023-06-26 DIAGNOSIS — Z4932 Encounter for adequacy testing for peritoneal dialysis: Secondary | ICD-10-CM | POA: Diagnosis not present

## 2023-06-26 DIAGNOSIS — E039 Hypothyroidism, unspecified: Secondary | ICD-10-CM | POA: Diagnosis not present

## 2023-06-26 DIAGNOSIS — E1022 Type 1 diabetes mellitus with diabetic chronic kidney disease: Secondary | ICD-10-CM | POA: Diagnosis not present

## 2023-06-26 DIAGNOSIS — N2581 Secondary hyperparathyroidism of renal origin: Secondary | ICD-10-CM | POA: Diagnosis not present

## 2023-06-26 DIAGNOSIS — E063 Autoimmune thyroiditis: Secondary | ICD-10-CM | POA: Diagnosis not present

## 2023-06-26 DIAGNOSIS — D631 Anemia in chronic kidney disease: Secondary | ICD-10-CM | POA: Diagnosis not present

## 2023-06-26 DIAGNOSIS — D509 Iron deficiency anemia, unspecified: Secondary | ICD-10-CM | POA: Diagnosis not present

## 2023-06-26 DIAGNOSIS — E1043 Type 1 diabetes mellitus with diabetic autonomic (poly)neuropathy: Secondary | ICD-10-CM | POA: Diagnosis not present

## 2023-06-26 DIAGNOSIS — N186 End stage renal disease: Secondary | ICD-10-CM | POA: Diagnosis not present

## 2023-06-26 DIAGNOSIS — Z992 Dependence on renal dialysis: Secondary | ICD-10-CM | POA: Diagnosis not present

## 2023-06-27 ENCOUNTER — Ambulatory Visit (INDEPENDENT_AMBULATORY_CARE_PROVIDER_SITE_OTHER): Payer: 59 | Admitting: Podiatry

## 2023-06-27 DIAGNOSIS — D631 Anemia in chronic kidney disease: Secondary | ICD-10-CM | POA: Diagnosis not present

## 2023-06-27 DIAGNOSIS — E1165 Type 2 diabetes mellitus with hyperglycemia: Secondary | ICD-10-CM | POA: Diagnosis not present

## 2023-06-27 DIAGNOSIS — E1042 Type 1 diabetes mellitus with diabetic polyneuropathy: Secondary | ICD-10-CM | POA: Diagnosis not present

## 2023-06-27 DIAGNOSIS — L97511 Non-pressure chronic ulcer of other part of right foot limited to breakdown of skin: Secondary | ICD-10-CM | POA: Diagnosis not present

## 2023-06-27 DIAGNOSIS — N186 End stage renal disease: Secondary | ICD-10-CM | POA: Diagnosis not present

## 2023-06-27 DIAGNOSIS — E1022 Type 1 diabetes mellitus with diabetic chronic kidney disease: Secondary | ICD-10-CM | POA: Diagnosis not present

## 2023-06-27 DIAGNOSIS — D509 Iron deficiency anemia, unspecified: Secondary | ICD-10-CM | POA: Diagnosis not present

## 2023-06-27 DIAGNOSIS — Z4932 Encounter for adequacy testing for peritoneal dialysis: Secondary | ICD-10-CM | POA: Diagnosis not present

## 2023-06-27 DIAGNOSIS — E063 Autoimmune thyroiditis: Secondary | ICD-10-CM | POA: Diagnosis not present

## 2023-06-27 DIAGNOSIS — E039 Hypothyroidism, unspecified: Secondary | ICD-10-CM | POA: Diagnosis not present

## 2023-06-27 DIAGNOSIS — E10621 Type 1 diabetes mellitus with foot ulcer: Secondary | ICD-10-CM | POA: Diagnosis not present

## 2023-06-27 DIAGNOSIS — N2581 Secondary hyperparathyroidism of renal origin: Secondary | ICD-10-CM | POA: Diagnosis not present

## 2023-06-27 DIAGNOSIS — E1142 Type 2 diabetes mellitus with diabetic polyneuropathy: Secondary | ICD-10-CM

## 2023-06-27 DIAGNOSIS — Z992 Dependence on renal dialysis: Secondary | ICD-10-CM | POA: Diagnosis not present

## 2023-06-27 DIAGNOSIS — E1043 Type 1 diabetes mellitus with diabetic autonomic (poly)neuropathy: Secondary | ICD-10-CM | POA: Diagnosis not present

## 2023-06-27 NOTE — Progress Notes (Unsigned)
Chief Complaint  Patient presents with   Callouses   HPI: 28 y.o. female presents today for f/u right submet 1 ulcer.  She notes she has swelling in both legs today which is not normal for her.  States she hasn't been active lately.  She still hasn't gotten the custom orthotics yet and notes she doesn't have the Rx for [facility] and wants a new one.    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 10/21/2021   Diabetic autonomic neuropathy ([name])    Diabetic peripheral neuropathy ([name])    Environmental allergies    Goiter    History of blood transfusion    Hypertension    no meds currently   Hypoglycemia associated with diabetes ([name])    Musculoskeletal pain 04/20/2022   Tachycardia    Thyroiditis, autoimmune    Type 1 diabetes mellitus in patient age 29-19 years with HbA1C goal below 7.5     Past Surgical History:  Procedure Laterality Date   AV FISTULA PLACEMENT Right 12/08/2021   Procedure: ARTERIOVENOUS  FISTULA CREATION VERSUS GRAFT;  Surgeon: Larina Earthly, MD;  Location: [facility];  Service: Vascular;  Laterality: Right;   BASCILIC VEIN TRANSPOSITION Right 01/12/2022   Procedure: RIGHT ARM SECOND STAGE BASILIC VEIN TRANSPOSITION;  Surgeon: Larina Earthly, MD;  Location: [facility];  Service: Vascular;  Laterality: Right;   BIOPSY  08/27/2016   Procedure: BIOPSY;  Surgeon: West Bali, MD;  [facility];  Service: Endoscopy;;  duodenum; gastric   BIOPSY  06/27/2019   Procedure: BIOPSY;  Surgeon: Corbin Ade, MD;  [facility];  Service: Endoscopy;;   BIOPSY  02/09/2022   Procedure: BIOPSY;  Surgeon: Lanelle Bal, DO;  [facility];  Service: Endoscopy;;   BREAST CYST EXCISION Right 11/12/2021   Procedure:  RIGHT BREAST ABSCESS INCISION AND DRAINAGE;  Surgeon: Emelia Loron, MD;  [facility] OR;  Service: General;  Laterality: Right;  LMA   COLONOSCOPY     COLONOSCOPY WITH PROPOFOL N/A 02/09/2022   Procedure: COLONOSCOPY WITH PROPOFOL;  Surgeon: Lanelle Bal, DO;  [facility];  Service: Endoscopy;  Laterality: N/A;  7:30am, asa 3, dialysis pt   ESOPHAGOGASTRODUODENOSCOPY N/A 08/27/2016   Dr. Darrick Penna: mild gastritis. Negative celiac. No obvious source for dyspepsia/diarrhea   ESOPHAGOGASTRODUODENOSCOPY (EGD) WITH PROPOFOL N/A 06/27/2019   rourk: Focal abnormality of the gastric mucosa likely due to trauma (heaving).  Biopsy showed mild gastritis, negative for H. pylori.  Esophageal dilation for history of dysphagia but normal-appearing esophagus.   ESOPHAGOGASTRODUODENOSCOPY (EGD) WITH PROPOFOL N/A 02/09/2022   Procedure: ESOPHAGOGASTRODUODENOSCOPY (EGD) WITH PROPOFOL;  Surgeon: Lanelle Bal, DO;  [facility];  Service: Endoscopy;  Laterality: N/A;   IR FLUORO GUIDE CV LINE RIGHT  10/13/2021   IR FLUORO GUIDE CV LINE RIGHT  10/19/2021   IR US GUIDE VASC ACCESS RIGHT  10/13/2021   IRRIGATION AND DEBRIDEMENT ABSCESS N/A 10/04/2021   Procedure: IRRIGATION AND DEBRIDEMENT NECK ABSCESS;  Surgeon: Axel Filler, MD;  Location: [facility] OR;  Service: General;  Laterality: N/A;   No Known Allergies   Physical Exam: Wound is closed with hemorrhagic callus right submet 1.  +1 pitting edema bilateral  legs and ankles.  No cellulitis.    Assessment/Plan of Care: 1. Ulcer of right foot, limited to breakdown of skin ([name])   2. Type 1 diabetes mellitus with foot ulcer (CODE) ([name])   3. Type 1 diabetes mellitus with peripheral neuropathy [facility])    Discussed clinical findings with patient today.  New Rx for [facility] for custom orthotics with 1st met head offloaded to prevent ulcer recurrence.    Patient given an Ace wrap for compression of leg/foot.  Wear during waking hours.   Elevate legs above heart level 30 minutes 3x per day.  Call PCP if swelling does not improve within the next few days.    Hemorrhagic callus shaved with sterile #[id number] blade.  Wear off-loading inserts until she can get the orthotics.  F/u 2-3 weeks for recheck   Clerance Lav, DPM, FACFAS [facility]     [address] [place], [place] [address]                Office [phone]  Fax [phone]

## 2023-06-28 DIAGNOSIS — E1043 Type 1 diabetes mellitus with diabetic autonomic (poly)neuropathy: Secondary | ICD-10-CM | POA: Diagnosis not present

## 2023-06-28 DIAGNOSIS — E063 Autoimmune thyroiditis: Secondary | ICD-10-CM | POA: Diagnosis not present

## 2023-06-28 DIAGNOSIS — D509 Iron deficiency anemia, unspecified: Secondary | ICD-10-CM | POA: Diagnosis not present

## 2023-06-28 DIAGNOSIS — D631 Anemia in chronic kidney disease: Secondary | ICD-10-CM | POA: Diagnosis not present

## 2023-06-28 DIAGNOSIS — E039 Hypothyroidism, unspecified: Secondary | ICD-10-CM | POA: Diagnosis not present

## 2023-06-28 DIAGNOSIS — N2581 Secondary hyperparathyroidism of renal origin: Secondary | ICD-10-CM | POA: Diagnosis not present

## 2023-06-28 DIAGNOSIS — Z4932 Encounter for adequacy testing for peritoneal dialysis: Secondary | ICD-10-CM | POA: Diagnosis not present

## 2023-06-28 DIAGNOSIS — N186 End stage renal disease: Secondary | ICD-10-CM | POA: Diagnosis not present

## 2023-06-28 DIAGNOSIS — E1022 Type 1 diabetes mellitus with diabetic chronic kidney disease: Secondary | ICD-10-CM | POA: Diagnosis not present

## 2023-06-28 DIAGNOSIS — Z992 Dependence on renal dialysis: Secondary | ICD-10-CM | POA: Diagnosis not present

## 2023-06-29 DIAGNOSIS — E1022 Type 1 diabetes mellitus with diabetic chronic kidney disease: Secondary | ICD-10-CM | POA: Diagnosis not present

## 2023-06-29 DIAGNOSIS — E063 Autoimmune thyroiditis: Secondary | ICD-10-CM | POA: Diagnosis not present

## 2023-06-29 DIAGNOSIS — D631 Anemia in chronic kidney disease: Secondary | ICD-10-CM | POA: Diagnosis not present

## 2023-06-29 DIAGNOSIS — Z992 Dependence on renal dialysis: Secondary | ICD-10-CM | POA: Diagnosis not present

## 2023-06-29 DIAGNOSIS — E039 Hypothyroidism, unspecified: Secondary | ICD-10-CM | POA: Diagnosis not present

## 2023-06-29 DIAGNOSIS — D509 Iron deficiency anemia, unspecified: Secondary | ICD-10-CM | POA: Diagnosis not present

## 2023-06-29 DIAGNOSIS — Z4932 Encounter for adequacy testing for peritoneal dialysis: Secondary | ICD-10-CM | POA: Diagnosis not present

## 2023-06-29 DIAGNOSIS — N2581 Secondary hyperparathyroidism of renal origin: Secondary | ICD-10-CM | POA: Diagnosis not present

## 2023-06-29 DIAGNOSIS — E1043 Type 1 diabetes mellitus with diabetic autonomic (poly)neuropathy: Secondary | ICD-10-CM | POA: Diagnosis not present

## 2023-06-29 DIAGNOSIS — N186 End stage renal disease: Secondary | ICD-10-CM | POA: Diagnosis not present

## 2023-06-30 ENCOUNTER — Ambulatory Visit: Payer: 59 | Admitting: Orthopaedic Surgery

## 2023-06-30 DIAGNOSIS — D509 Iron deficiency anemia, unspecified: Secondary | ICD-10-CM | POA: Diagnosis not present

## 2023-06-30 DIAGNOSIS — E039 Hypothyroidism, unspecified: Secondary | ICD-10-CM | POA: Diagnosis not present

## 2023-06-30 DIAGNOSIS — E1043 Type 1 diabetes mellitus with diabetic autonomic (poly)neuropathy: Secondary | ICD-10-CM | POA: Diagnosis not present

## 2023-06-30 DIAGNOSIS — E063 Autoimmune thyroiditis: Secondary | ICD-10-CM | POA: Diagnosis not present

## 2023-06-30 DIAGNOSIS — D631 Anemia in chronic kidney disease: Secondary | ICD-10-CM | POA: Diagnosis not present

## 2023-06-30 DIAGNOSIS — Z4932 Encounter for adequacy testing for peritoneal dialysis: Secondary | ICD-10-CM | POA: Diagnosis not present

## 2023-06-30 DIAGNOSIS — N2581 Secondary hyperparathyroidism of renal origin: Secondary | ICD-10-CM | POA: Diagnosis not present

## 2023-06-30 DIAGNOSIS — Z992 Dependence on renal dialysis: Secondary | ICD-10-CM | POA: Diagnosis not present

## 2023-06-30 DIAGNOSIS — E1022 Type 1 diabetes mellitus with diabetic chronic kidney disease: Secondary | ICD-10-CM | POA: Diagnosis not present

## 2023-06-30 DIAGNOSIS — N186 End stage renal disease: Secondary | ICD-10-CM | POA: Diagnosis not present

## 2023-07-01 DIAGNOSIS — Z4932 Encounter for adequacy testing for peritoneal dialysis: Secondary | ICD-10-CM | POA: Diagnosis not present

## 2023-07-01 DIAGNOSIS — E1022 Type 1 diabetes mellitus with diabetic chronic kidney disease: Secondary | ICD-10-CM | POA: Diagnosis not present

## 2023-07-01 DIAGNOSIS — Z992 Dependence on renal dialysis: Secondary | ICD-10-CM | POA: Diagnosis not present

## 2023-07-01 DIAGNOSIS — E063 Autoimmune thyroiditis: Secondary | ICD-10-CM | POA: Diagnosis not present

## 2023-07-01 DIAGNOSIS — E1043 Type 1 diabetes mellitus with diabetic autonomic (poly)neuropathy: Secondary | ICD-10-CM | POA: Diagnosis not present

## 2023-07-01 DIAGNOSIS — D509 Iron deficiency anemia, unspecified: Secondary | ICD-10-CM | POA: Diagnosis not present

## 2023-07-01 DIAGNOSIS — N2581 Secondary hyperparathyroidism of renal origin: Secondary | ICD-10-CM | POA: Diagnosis not present

## 2023-07-01 DIAGNOSIS — N186 End stage renal disease: Secondary | ICD-10-CM | POA: Diagnosis not present

## 2023-07-01 DIAGNOSIS — D631 Anemia in chronic kidney disease: Secondary | ICD-10-CM | POA: Diagnosis not present

## 2023-07-01 DIAGNOSIS — E039 Hypothyroidism, unspecified: Secondary | ICD-10-CM | POA: Diagnosis not present

## 2023-07-02 DIAGNOSIS — N186 End stage renal disease: Secondary | ICD-10-CM | POA: Diagnosis not present

## 2023-07-02 DIAGNOSIS — N2581 Secondary hyperparathyroidism of renal origin: Secondary | ICD-10-CM | POA: Diagnosis not present

## 2023-07-02 DIAGNOSIS — D631 Anemia in chronic kidney disease: Secondary | ICD-10-CM | POA: Diagnosis not present

## 2023-07-02 DIAGNOSIS — Z992 Dependence on renal dialysis: Secondary | ICD-10-CM | POA: Diagnosis not present

## 2023-07-02 DIAGNOSIS — E039 Hypothyroidism, unspecified: Secondary | ICD-10-CM | POA: Diagnosis not present

## 2023-07-02 DIAGNOSIS — D509 Iron deficiency anemia, unspecified: Secondary | ICD-10-CM | POA: Diagnosis not present

## 2023-07-02 DIAGNOSIS — E1043 Type 1 diabetes mellitus with diabetic autonomic (poly)neuropathy: Secondary | ICD-10-CM | POA: Diagnosis not present

## 2023-07-02 DIAGNOSIS — E063 Autoimmune thyroiditis: Secondary | ICD-10-CM | POA: Diagnosis not present

## 2023-07-02 DIAGNOSIS — Z4932 Encounter for adequacy testing for peritoneal dialysis: Secondary | ICD-10-CM | POA: Diagnosis not present

## 2023-07-02 DIAGNOSIS — E1022 Type 1 diabetes mellitus with diabetic chronic kidney disease: Secondary | ICD-10-CM | POA: Diagnosis not present

## 2023-07-03 DIAGNOSIS — N2581 Secondary hyperparathyroidism of renal origin: Secondary | ICD-10-CM | POA: Diagnosis not present

## 2023-07-03 DIAGNOSIS — Z4932 Encounter for adequacy testing for peritoneal dialysis: Secondary | ICD-10-CM | POA: Diagnosis not present

## 2023-07-03 DIAGNOSIS — E1022 Type 1 diabetes mellitus with diabetic chronic kidney disease: Secondary | ICD-10-CM | POA: Diagnosis not present

## 2023-07-03 DIAGNOSIS — Z992 Dependence on renal dialysis: Secondary | ICD-10-CM | POA: Diagnosis not present

## 2023-07-03 DIAGNOSIS — D631 Anemia in chronic kidney disease: Secondary | ICD-10-CM | POA: Diagnosis not present

## 2023-07-03 DIAGNOSIS — E039 Hypothyroidism, unspecified: Secondary | ICD-10-CM | POA: Diagnosis not present

## 2023-07-03 DIAGNOSIS — N186 End stage renal disease: Secondary | ICD-10-CM | POA: Diagnosis not present

## 2023-07-03 DIAGNOSIS — E063 Autoimmune thyroiditis: Secondary | ICD-10-CM | POA: Diagnosis not present

## 2023-07-03 DIAGNOSIS — D509 Iron deficiency anemia, unspecified: Secondary | ICD-10-CM | POA: Diagnosis not present

## 2023-07-03 DIAGNOSIS — E1043 Type 1 diabetes mellitus with diabetic autonomic (poly)neuropathy: Secondary | ICD-10-CM | POA: Diagnosis not present

## 2023-07-04 ENCOUNTER — Other Ambulatory Visit: Payer: Self-pay

## 2023-07-04 ENCOUNTER — Emergency Department (HOSPITAL_COMMUNITY): Payer: 59

## 2023-07-04 ENCOUNTER — Inpatient Hospital Stay (HOSPITAL_COMMUNITY)
Admission: EM | Admit: 2023-07-04 | Discharge: 2023-07-13 | DRG: 907 | Disposition: A | Discharge status: Home / Self Care | Payer: 59 | Attending: Internal Medicine | Admitting: Internal Medicine

## 2023-07-04 ENCOUNTER — Encounter (HOSPITAL_COMMUNITY): Payer: Self-pay | Admitting: Emergency Medicine

## 2023-07-04 DIAGNOSIS — E1042 Type 1 diabetes mellitus with diabetic polyneuropathy: Secondary | ICD-10-CM | POA: Diagnosis present

## 2023-07-04 DIAGNOSIS — T8571XD Infection and inflammatory reaction due to peritoneal dialysis catheter, subsequent encounter: Secondary | ICD-10-CM | POA: Diagnosis not present

## 2023-07-04 DIAGNOSIS — Y838 Other surgical procedures as the cause of abnormal reaction of the patient, or of later complication, without mention of misadventure at the time of the procedure: Secondary | ICD-10-CM | POA: Diagnosis present

## 2023-07-04 DIAGNOSIS — D6489 Other specified anemias: Secondary | ICD-10-CM | POA: Diagnosis present

## 2023-07-04 DIAGNOSIS — A09 Infectious gastroenteritis and colitis, unspecified: Secondary | ICD-10-CM | POA: Diagnosis not present

## 2023-07-04 DIAGNOSIS — K659 Peritonitis, unspecified: Secondary | ICD-10-CM | POA: Diagnosis present

## 2023-07-04 DIAGNOSIS — Z992 Dependence on renal dialysis: Secondary | ICD-10-CM | POA: Diagnosis not present

## 2023-07-04 DIAGNOSIS — B9561 Methicillin susceptible Staphylococcus aureus infection as the cause of diseases classified elsewhere: Secondary | ICD-10-CM

## 2023-07-04 DIAGNOSIS — H548 Legal blindness, as defined in USA: Secondary | ICD-10-CM | POA: Diagnosis present

## 2023-07-04 DIAGNOSIS — L8990 Pressure ulcer of unspecified site, unspecified stage: Secondary | ICD-10-CM | POA: Insufficient documentation

## 2023-07-04 DIAGNOSIS — E063 Autoimmune thyroiditis: Secondary | ICD-10-CM | POA: Diagnosis present

## 2023-07-04 DIAGNOSIS — I120 Hypertensive chronic kidney disease with stage 5 chronic kidney disease or end stage renal disease: Secondary | ICD-10-CM | POA: Diagnosis present

## 2023-07-04 DIAGNOSIS — I3139 Other pericardial effusion (noninflammatory): Secondary | ICD-10-CM | POA: Diagnosis not present

## 2023-07-04 DIAGNOSIS — E103591 Type 1 diabetes mellitus with proliferative diabetic retinopathy without macular edema, right eye: Secondary | ICD-10-CM | POA: Diagnosis present

## 2023-07-04 DIAGNOSIS — N032 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: Secondary | ICD-10-CM | POA: Diagnosis not present

## 2023-07-04 DIAGNOSIS — E1043 Type 1 diabetes mellitus with diabetic autonomic (poly)neuropathy: Secondary | ICD-10-CM | POA: Diagnosis present

## 2023-07-04 DIAGNOSIS — R918 Other nonspecific abnormal finding of lung field: Secondary | ICD-10-CM | POA: Diagnosis not present

## 2023-07-04 DIAGNOSIS — L89152 Pressure ulcer of sacral region, stage 2: Secondary | ICD-10-CM | POA: Diagnosis present

## 2023-07-04 DIAGNOSIS — Z833 Family history of diabetes mellitus: Secondary | ICD-10-CM

## 2023-07-04 DIAGNOSIS — Z560 Unemployment, unspecified: Secondary | ICD-10-CM

## 2023-07-04 DIAGNOSIS — R112 Nausea with vomiting, unspecified: Secondary | ICD-10-CM | POA: Diagnosis present

## 2023-07-04 DIAGNOSIS — A419 Sepsis, unspecified organism: Secondary | ICD-10-CM | POA: Diagnosis present

## 2023-07-04 DIAGNOSIS — E1022 Type 1 diabetes mellitus with diabetic chronic kidney disease: Secondary | ICD-10-CM | POA: Diagnosis present

## 2023-07-04 DIAGNOSIS — Z681 Body mass index (BMI) 19 or less, adult: Secondary | ICD-10-CM | POA: Diagnosis not present

## 2023-07-04 DIAGNOSIS — A4101 Sepsis due to Methicillin susceptible Staphylococcus aureus: Secondary | ICD-10-CM | POA: Diagnosis present

## 2023-07-04 DIAGNOSIS — Z811 Family history of alcohol abuse and dependence: Secondary | ICD-10-CM

## 2023-07-04 DIAGNOSIS — E785 Hyperlipidemia, unspecified: Secondary | ICD-10-CM | POA: Diagnosis present

## 2023-07-04 DIAGNOSIS — R590 Localized enlarged lymph nodes: Secondary | ICD-10-CM | POA: Diagnosis not present

## 2023-07-04 DIAGNOSIS — Z1152 Encounter for screening for COVID-19: Secondary | ICD-10-CM

## 2023-07-04 DIAGNOSIS — D631 Anemia in chronic kidney disease: Secondary | ICD-10-CM | POA: Diagnosis present

## 2023-07-04 DIAGNOSIS — Z7985 Long-term (current) use of injectable non-insulin antidiabetic drugs: Secondary | ICD-10-CM

## 2023-07-04 DIAGNOSIS — N186 End stage renal disease: Secondary | ICD-10-CM | POA: Diagnosis present

## 2023-07-04 DIAGNOSIS — R7881 Bacteremia: Secondary | ICD-10-CM | POA: Diagnosis not present

## 2023-07-04 DIAGNOSIS — J45909 Unspecified asthma, uncomplicated: Secondary | ICD-10-CM | POA: Diagnosis not present

## 2023-07-04 DIAGNOSIS — T8571XA Infection and inflammatory reaction due to peritoneal dialysis catheter, initial encounter: Principal | ICD-10-CM | POA: Diagnosis present

## 2023-07-04 DIAGNOSIS — K658 Other peritonitis: Secondary | ICD-10-CM | POA: Diagnosis not present

## 2023-07-04 DIAGNOSIS — H3322 Serous retinal detachment, left eye: Secondary | ICD-10-CM | POA: Diagnosis present

## 2023-07-04 DIAGNOSIS — N185 Chronic kidney disease, stage 5: Secondary | ICD-10-CM | POA: Diagnosis not present

## 2023-07-04 DIAGNOSIS — R110 Nausea: Secondary | ICD-10-CM | POA: Diagnosis not present

## 2023-07-04 DIAGNOSIS — I10 Essential (primary) hypertension: Secondary | ICD-10-CM | POA: Diagnosis not present

## 2023-07-04 DIAGNOSIS — R6521 Severe sepsis with septic shock: Secondary | ICD-10-CM | POA: Diagnosis present

## 2023-07-04 DIAGNOSIS — R197 Diarrhea, unspecified: Secondary | ICD-10-CM | POA: Diagnosis not present

## 2023-07-04 DIAGNOSIS — E861 Hypovolemia: Secondary | ICD-10-CM | POA: Diagnosis present

## 2023-07-04 DIAGNOSIS — H33002 Unspecified retinal detachment with retinal break, left eye: Secondary | ICD-10-CM | POA: Diagnosis not present

## 2023-07-04 DIAGNOSIS — Z79899 Other long term (current) drug therapy: Secondary | ICD-10-CM

## 2023-07-04 DIAGNOSIS — K861 Other chronic pancreatitis: Secondary | ICD-10-CM | POA: Diagnosis not present

## 2023-07-04 DIAGNOSIS — K219 Gastro-esophageal reflux disease without esophagitis: Secondary | ICD-10-CM | POA: Diagnosis present

## 2023-07-04 DIAGNOSIS — R109 Unspecified abdominal pain: Secondary | ICD-10-CM | POA: Diagnosis not present

## 2023-07-04 DIAGNOSIS — N2581 Secondary hyperparathyroidism of renal origin: Secondary | ICD-10-CM | POA: Diagnosis present

## 2023-07-04 DIAGNOSIS — E876 Hypokalemia: Secondary | ICD-10-CM | POA: Diagnosis present

## 2023-07-04 DIAGNOSIS — Q2112 Patent foramen ovale: Secondary | ICD-10-CM | POA: Diagnosis not present

## 2023-07-04 DIAGNOSIS — E10649 Type 1 diabetes mellitus with hypoglycemia without coma: Secondary | ICD-10-CM | POA: Diagnosis not present

## 2023-07-04 DIAGNOSIS — N250 Renal osteodystrophy: Secondary | ICD-10-CM | POA: Diagnosis not present

## 2023-07-04 DIAGNOSIS — Z809 Family history of malignant neoplasm, unspecified: Secondary | ICD-10-CM

## 2023-07-04 DIAGNOSIS — R0600 Dyspnea, unspecified: Secondary | ICD-10-CM | POA: Diagnosis not present

## 2023-07-04 DIAGNOSIS — R000 Tachycardia, unspecified: Secondary | ICD-10-CM | POA: Diagnosis not present

## 2023-07-04 DIAGNOSIS — R636 Underweight: Secondary | ICD-10-CM | POA: Diagnosis present

## 2023-07-04 DIAGNOSIS — E1122 Type 2 diabetes mellitus with diabetic chronic kidney disease: Secondary | ICD-10-CM | POA: Diagnosis not present

## 2023-07-04 DIAGNOSIS — T8571XS Infection and inflammatory reaction due to peritoneal dialysis catheter, sequela: Secondary | ICD-10-CM | POA: Diagnosis not present

## 2023-07-04 DIAGNOSIS — E109 Type 1 diabetes mellitus without complications: Secondary | ICD-10-CM | POA: Diagnosis not present

## 2023-07-04 DIAGNOSIS — J90 Pleural effusion, not elsewhere classified: Secondary | ICD-10-CM | POA: Diagnosis not present

## 2023-07-04 LAB — I-STAT CG4 LACTIC ACID, ED
Lactic Acid, Venous: 2.1 mmol/L (ref 0.5–1.9)
Lactic Acid, Venous: 0.7 mmol/L (ref 0.5–1.9)

## 2023-07-04 LAB — BASIC METABOLIC PANEL
Anion gap: 12 (ref 5–15)
BUN: 35 mg/dL — ABNORMAL HIGH (ref 6–20)
CO2: 20 mmol/L — ABNORMAL LOW (ref 22–32)
Calcium: 7.1 mg/dL — ABNORMAL LOW (ref 8.9–10.3)
Chloride: 103 mmol/L (ref 98–111)
Creatinine, Ser: 9.84 mg/dL — ABNORMAL HIGH (ref 0.44–1.00)
GFR, Estimated: 5 mL/min — ABNORMAL LOW (ref >60)
Glucose, Bld: 132 mg/dL — ABNORMAL HIGH (ref 70–99)
Potassium: 2.3 mmol/L — CL (ref 3.5–5.1)
Sodium: 135 mmol/L (ref 135–145)

## 2023-07-04 LAB — CBC WITH DIFFERENTIAL/PLATELET
Abs Immature Granulocytes: 0.04 10*3/uL (ref 0.00–0.07)
Basophils Absolute: 0.1 10*3/uL (ref 0.0–0.1)
Basophils Relative: 1 %
Eosinophils Absolute: 0.1 10*3/uL (ref 0.0–0.5)
Eosinophils Relative: 1 %
HCT: 32.1 % — ABNORMAL LOW (ref 36.0–46.0)
Hemoglobin: 10.5 g/dL — ABNORMAL LOW (ref 12.0–15.0)
Immature Granulocytes: 0 %
Lymphocytes Relative: 5 %
Lymphs Abs: 0.5 10*3/uL — ABNORMAL LOW (ref 0.7–4.0)
MCH: 29.3 pg (ref 26.0–34.0)
MCHC: 32.7 g/dL (ref 30.0–36.0)
MCV: 89.7 fL (ref 80.0–100.0)
Monocytes Absolute: 0.2 10*3/uL (ref 0.1–1.0)
Monocytes Relative: 2 %
Neutro Abs: 9 10*3/uL — ABNORMAL HIGH (ref 1.7–7.7)
Neutrophils Relative %: 91 %
Platelets: 244 10*3/uL (ref 150–400)
RBC: 3.58 MIL/uL — ABNORMAL LOW (ref 3.87–5.11)
RDW: 18.7 % — ABNORMAL HIGH (ref 11.5–15.5)
WBC: 10 10*3/uL (ref 4.0–10.5)
nRBC: 0 % (ref 0.0–0.2)

## 2023-07-04 LAB — BODY FLUID CELL COUNT WITH DIFFERENTIAL
Eos, Fluid: 1 %
Lymphs, Fluid: 2 %
Monocyte-Macrophage-Serous Fluid: 3 % — ABNORMAL LOW (ref 50–90)
Neutrophil Count, Fluid: 93 % — ABNORMAL HIGH (ref 0–25)
Other Cells, Fluid: 1 %
Total Nucleated Cell Count, Fluid: 1900 uL — ABNORMAL HIGH (ref 0–1000)

## 2023-07-04 LAB — PROTIME-INR
INR: 1.4 — ABNORMAL HIGH (ref 0.8–1.2)
Prothrombin Time: 17.2 s — ABNORMAL HIGH (ref 11.4–15.2)

## 2023-07-04 LAB — RESP PANEL BY RT-PCR (RSV, FLU A&B, COVID)  RVPGX2
Influenza A by PCR: NEGATIVE
Influenza B by PCR: NEGATIVE
Resp Syncytial Virus by PCR: NEGATIVE
SARS Coronavirus 2 by RT PCR: NEGATIVE

## 2023-07-04 LAB — COMPREHENSIVE METABOLIC PANEL
ALT: 10 U/L (ref 0–44)
AST: 10 U/L — ABNORMAL LOW (ref 15–41)
Albumin: 1.7 g/dL — ABNORMAL LOW (ref 3.5–5.0)
Alkaline Phosphatase: 84 U/L (ref 38–126)
Anion gap: 12 (ref 5–15)
BUN: 36 mg/dL — ABNORMAL HIGH (ref 6–20)
CO2: 17 mmol/L — ABNORMAL LOW (ref 22–32)
Calcium: 7.8 mg/dL — ABNORMAL LOW (ref 8.9–10.3)
Chloride: 105 mmol/L (ref 98–111)
Creatinine, Ser: 10.6 mg/dL — ABNORMAL HIGH (ref 0.44–1.00)
GFR, Estimated: 5 mL/min — ABNORMAL LOW (ref >60)
Glucose, Bld: 133 mg/dL — ABNORMAL HIGH (ref 70–99)
Potassium: 2.6 mmol/L — CL (ref 3.5–5.1)
Sodium: 134 mmol/L — ABNORMAL LOW (ref 135–145)
Total Bilirubin: 0.6 mg/dL (ref 0.0–1.2)
Total Protein: 5.4 g/dL — ABNORMAL LOW (ref 6.5–8.1)

## 2023-07-04 LAB — MAGNESIUM
Magnesium: 1.4 mg/dL — ABNORMAL LOW (ref 1.7–2.4)
Magnesium: 1.8 mg/dL (ref 1.7–2.4)

## 2023-07-04 LAB — HCG, SERUM, QUALITATIVE: Preg, Serum: NEGATIVE

## 2023-07-04 LAB — CBG MONITORING, ED: Glucose-Capillary: 125 mg/dL — ABNORMAL HIGH (ref 70–99)

## 2023-07-04 MED ORDER — NOREPINEPHRINE 4 MG/250ML-% IV SOLN
2.0000 ug/min | INTRAVENOUS | Status: DC
  Administered 2023-07-04 – 2023-07-05 (×2): 2 ug/min via INTRAVENOUS
  Filled 2023-07-04: qty 250

## 2023-07-04 MED ORDER — SODIUM CHLORIDE 0.9 % IV SOLN: 250.0000 mL | INTRAVENOUS | Status: AC

## 2023-07-04 MED ORDER — LACTATED RINGERS IV BOLUS
500.0000 mL | Freq: Once | INTRAVENOUS | Status: AC
  Administered 2023-07-04: 500 mL via INTRAVENOUS

## 2023-07-04 MED ORDER — FENTANYL CITRATE PF 50 MCG/ML IJ SOSY
50.0000 ug | PREFILLED_SYRINGE | Freq: Once | INTRAMUSCULAR | Status: AC
  Administered 2023-07-04: 50 ug via INTRAVENOUS
  Filled 2023-07-04: qty 1

## 2023-07-04 MED ORDER — LACTATED RINGERS IV BOLUS (SEPSIS)
500.0000 mL | Freq: Once | INTRAVENOUS | Status: AC
  Administered 2023-07-04: 500 mL via INTRAVENOUS

## 2023-07-04 MED ORDER — DELFLEX-LC/1.5% DEXTROSE 344 MOSM/L IP SOLN: INTRAPERITONEAL | Status: DC

## 2023-07-04 MED ORDER — DOCUSATE SODIUM 100 MG PO CAPS: 100.0000 mg | ORAL_CAPSULE | Freq: Two times a day (BID) | ORAL | Status: DC | PRN

## 2023-07-04 MED ORDER — ALTEPLASE 2 MG IJ SOLR
2.0000 mg | Freq: Once | INTRAMUSCULAR | Status: AC
  Administered 2023-07-04: 2 mg
  Filled 2023-07-04: qty 2

## 2023-07-04 MED ORDER — SODIUM CHLORIDE 0.9 % IV SOLN
2.0000 g | Freq: Once | INTRAVENOUS | Status: AC
  Administered 2023-07-04: 2 g via INTRAVENOUS
  Filled 2023-07-04: qty 12.5

## 2023-07-04 MED ORDER — VANCOMYCIN HCL 1500 MG/300ML IV SOLN
1500.0000 mg | Freq: Once | INTRAVENOUS | Status: AC
  Administered 2023-07-04: 1500 mg via INTRAVENOUS
  Filled 2023-07-04: qty 300

## 2023-07-04 MED ORDER — SODIUM CHLORIDE 0.9 % IV SOLN: 250.0000 mL | INTRAVENOUS | Status: DC

## 2023-07-04 MED ORDER — HEPARIN SODIUM (PORCINE) 5000 UNIT/ML IJ SOLN
5000.0000 [IU] | Freq: Three times a day (TID) | INTRAMUSCULAR | Status: DC
  Administered 2023-07-05 – 2023-07-13 (×24): 5000 [IU] via SUBCUTANEOUS
  Filled 2023-07-04 (×24): qty 1

## 2023-07-04 MED ORDER — ACETAMINOPHEN 500 MG PO TABS
1000.0000 mg | ORAL_TABLET | Freq: Once | ORAL | Status: AC
  Administered 2023-07-04: 1000 mg via ORAL
  Filled 2023-07-04: qty 2

## 2023-07-04 MED ORDER — NOREPINEPHRINE 4 MG/250ML-% IV SOLN: 2.0000 ug/min | INTRAVENOUS | Status: DC

## 2023-07-04 MED ORDER — DELFLEX-LC/2.5% DEXTROSE 394 MOSM/L IP SOLN: INTRAPERITONEAL | Status: DC

## 2023-07-04 MED ORDER — POLYETHYLENE GLYCOL 3350 17 G PO PACK: 17.0000 g | PACK | Freq: Every day | ORAL | Status: DC | PRN

## 2023-07-04 MED ORDER — LACTATED RINGERS IV BOLUS
1000.0000 mL | Freq: Once | INTRAVENOUS | Status: AC
  Administered 2023-07-04: 1000 mL via INTRAVENOUS

## 2023-07-04 MED ORDER — GENTAMICIN SULFATE 0.1 % EX CREA
1.0000 | TOPICAL_CREAM | Freq: Every day | CUTANEOUS | Status: DC
  Administered 2023-07-05: 1 via TOPICAL
  Filled 2023-07-04: qty 15

## 2023-07-04 NOTE — ED Provider Notes (Signed)
 [place] EMERGENCY DEPARTMENT AT [facility] Provider Note   CSN: [id number] Arrival date & time: 07/04/23  1517     History  Chief Complaint  Patient presents with   Abdominal Pain    Lindsey Murillo is a 28 y.o. female.   Abdominal Pain Associated symptoms: diarrhea, nausea and vomiting      28 year old female with medical history significant for DM1, ESRD now on peritoneal dialysis presenting to the emergency department with abdominal pain.  The patient states that she has had nausea vomiting and diarrhea over the past 2 days.  She endorses severe lower abdominal pain.  She also has had a mild cough over the same amount of time.  She endorses subjective fevers and chills.  She has had multiple episodes of diarrhea as well as NBNB emesis.  Remainder of history limited by patient pain and acuity.  Home Medications Prior to Admission medications   Medication Sig Start Date End Date Taking? Authorizing Provider  B Complex-C-Folic Acid (DIALYVITE 800) 0.8 MG TABS Take 1 tablet by mouth daily. 11/16/21  Yes [provider]  calcitRIOL (ROCALTROL) 0.5 MCG capsule Take 1 mcg by mouth daily.   Yes [provider]  cinacalcet (SENSIPAR) 30 MG tablet Take 30 mg by mouth. 06/10/23  Yes [provider]  gentamicin cream (GARAMYCIN) 0.1 % Apply 1 Application topically daily.   Yes [provider]  Insulin Aspart, w/Niacinamide, (FIASP) 100 UNIT/ML SOLN Inject 5-8 Units as directed with breakfast, with lunch, and with evening meal. Sliding Scale   Yes [provider]  medroxyPROGESTERone (PROVERA) 10 MG tablet Take provera 10 mg for 10 days every 3 months 11/16/22  Yes Cyril Mourning A, NP  ondansetron (ZOFRAN-ODT) 4 MG disintegrating tablet Take 1 tablet (4 mg total) by mouth every 8 (eight) hours as needed for nausea or vomiting. 06/08/23  Yes Pollyann Savoy, MD  pantoprazole (PROTONIX) 40 MG tablet Take 1 tablet (40 mg total) by  mouth 2 (two) times daily before a meal. 03/22/23 03/16/24 Yes Mahon, Courtney L, NP  potassium chloride SA (KLOR-CON M) 20 MEQ tablet Take 20 mEq by mouth 2 (two) times daily. 06/23/23  Yes [provider]  rosuvastatin (CRESTOR) 10 MG tablet Take 1 tablet (10 mg total) by mouth daily. 03/15/23  Yes Cook, Jayce G, DO  scopolamine (TRANSDERM-SCOP) 1 MG/3DAYS Place 1 patch (1.5 mg total) onto the skin every 3 (three) days. 02/02/23  Yes Roemhildt, Lorin T, PA-C  sucroferric oxyhydroxide (VELPHORO) 500 MG chewable tablet Chew 1,000 mg by mouth 3 (three) times daily with meals. 11/16/21  Yes [provider]  TRESIBA FLEXTOUCH 100 UNIT/ML FlexTouch Pen Inject 15 Units into the skin at bedtime. 06/23/23  Yes Reardon, Freddi Starr, NP  VENTOLIN HFA 108 (90 Base) MCG/ACT inhaler Inhale 2 puffs into the lungs every 4 (four) hours as needed for wheezing or shortness of breath. 2 puffs once every 4 hours as needed for cough, wheeze, shortness of breath and chest tightness. May use 2 puffs 5 to 15 minutes before activity to decrease cough or wheeze. 03/24/23  Yes Padgett, Pilar Grammes, MD  Vitamin D, Ergocalciferol, (DRISDOL) 1.25 MG (50000 UNIT) CAPS capsule Take 50,000 Units by mouth every 7 (seven) days.   Yes [provider]  acetaminophen (TYLENOL) 325 MG tablet Take 325 mg by mouth every 6 (six) hours as needed for mild pain (pain score 1-3) or moderate pain (pain score 4-6). Patient not taking: Reported on 07/04/2023  12/24/21   [provider]  cetirizine HCl (ZYRTEC) 5 MG/5ML SOLN TAKE 1.25 ML TWICE A DAY AS NEEDED FOR A RUNNY NOSE OR ITCH Patient not taking: Reported on 07/04/2023 05/09/23   Marcelyn Bruins, MD  Continuous Glucose Receiver (DEXCOM G7 RECEIVER) DEVI by Does not apply route.    [provider]  Continuous Glucose Sensor (DEXCOM G7 SENSOR) MISC by Does not apply route.    [provider]  fluticasone (FLONASE) 50 MCG/ACT nasal spray  Place 2 sprays into both nostrils daily as needed for allergies or rhinitis. Patient not taking: Reported on 07/04/2023 12/23/22   Hetty Blend, FNP  SSD 1 % cream APPLY CREAM TO WOUND DAILY AND COVER WITH GAUZE DRESSING 04/01/23   McCaughan, Dia D, DPM      Allergies    Patient has no known allergies.    Review of Systems   Review of Systems  Unable to perform ROS: Acuity of condition  Gastrointestinal:  Positive for abdominal pain, diarrhea, nausea and vomiting.    Physical Exam Updated Vital Signs BP (!) 79/51   Pulse 89   Temp 98.7 F (37.1 C) (Oral)   Resp (!) 9   Ht 5\' 8"  (1.727 m)   Wt 56.7 kg   SpO2 100%   BMI 19.01 kg/m  Physical Exam Vitals and nursing note reviewed.  Constitutional:      General: She is in acute distress.     Appearance: She is ill-appearing.     Comments: Very uncomfortable appearing  HENT:     Head: Normocephalic and atraumatic.  Eyes:     Conjunctiva/sclera: Conjunctivae normal.     Pupils: Pupils are equal, round, and reactive to light.  Cardiovascular:     Rate and Rhythm: Regular rhythm. Tachycardia present.  Pulmonary:     Effort: Pulmonary effort is normal. No respiratory distress.  Abdominal:     General: There is no distension.     Tenderness: There is generalized abdominal tenderness. There is guarding and rebound.     Comments: Concern for peritonitis with guarding and rebound TTP diffusely throughout the abdomen  Musculoskeletal:        General: No deformity or signs of injury.     Cervical back: Neck supple.  Skin:    Findings: No lesion or rash.  Neurological:     General: No focal deficit present.     Mental Status: She is alert. Mental status is at baseline.     ED Results / Procedures / Treatments   Labs (all labs ordered are listed, but only abnormal results are displayed) Labs Reviewed  COMPREHENSIVE METABOLIC PANEL - Abnormal; Notable for the following components:      Result Value   Sodium 134 (*)     Potassium 2.6 (*)    CO2 17 (*)    Glucose, Bld 133 (*)    BUN 36 (*)    Creatinine, Ser 10.60 (*)    Calcium 7.8 (*)    Total Protein 5.4 (*)    Albumin 1.7 (*)    AST 10 (*)    GFR, Estimated 5 (*)    All other components within normal limits  CBC WITH DIFFERENTIAL/PLATELET - Abnormal; Notable for the following components:   RBC 3.58 (*)    Hemoglobin 10.5 (*)    HCT 32.1 (*)    RDW 18.7 (*)    Neutro Abs 9.0 (*)    Lymphs Abs 0.5 (*)    All  other components within normal limits  PROTIME-INR - Abnormal; Notable for the following components:   Prothrombin Time 17.2 (*)    INR 1.4 (*)    All other components within normal limits  BODY FLUID CELL COUNT WITH DIFFERENTIAL - Abnormal; Notable for the following components:   Color, Fluid STRAW (*)    Appearance, Fluid CLOUDY (*)    Total Nucleated Cell Count, Fluid 1,900 (*)    Neutrophil Count, Fluid 93 (*)    Monocyte-Macrophage-Serous Fluid 3 (*)    All other components within normal limits  MAGNESIUM - Abnormal; Notable for the following components:   Magnesium 1.4 (*)    All other components within normal limits  BASIC METABOLIC PANEL - Abnormal; Notable for the following components:   Potassium 2.3 (*)    CO2 20 (*)    Glucose, Bld 132 (*)    BUN 35 (*)    Creatinine, Ser 9.84 (*)    Calcium 7.1 (*)    GFR, Estimated 5 (*)    All other components within normal limits  I-STAT CG4 LACTIC ACID, ED - Abnormal; Notable for the following components:   Lactic Acid, Venous 2.1 (*)    All other components within normal limits  CBG MONITORING, ED - Abnormal; Notable for the following components:   Glucose-Capillary 125 (*)    All other components within normal limits  RESP PANEL BY RT-PCR (RSV, FLU A&B, COVID)  [id number]  BODY FLUID CULTURE W GRAM STAIN  CULTURE, BLOOD (ROUTINE X 2)  CULTURE, BLOOD (ROUTINE X 2)  BODY FLUID CULTURE W GRAM STAIN  HCG, SERUM, QUALITATIVE  MAGNESIUM  URINALYSIS, W/ REFLEX TO CULTURE (INFECTION  SUSPECTED)  SYNOVIAL CELL COUNT + DIFF, W/ CRYSTALS  GLUCOSE, BODY FLUID OTHER            BODY FLUID CELL COUNT WITH DIFFERENTIAL  RENAL FUNCTION PANEL  CBC  BASIC METABOLIC PANEL  MAGNESIUM  PHOSPHORUS  CBC  HIV ANTIBODY (ROUTINE TESTING W REFLEX)  I-STAT CG4 LACTIC ACID, ED    EKG EKG Interpretation Date/Time:  Monday July 04 2023 15:41:13 EST Ventricular Rate:  106 PR Interval:  150 QRS Duration:  94 QT Interval:  368 QTC Calculation: 488 R Axis:   75  Text Interpretation: Sinus tachycardia Left atrial enlargement Low voltage QRS Incomplete right bundle branch block Cannot rule out Anterior infarct , age undetermined Abnormal ECG When compared with ECG of 02-Feb-2023 15:31, PREVIOUS ECG IS PRESENT Confirmed by Ernie Avena ([id number]) on 07/04/2023 5:43:02 PM  Radiology CT ABDOMEN PELVIS WO CONTRAST Result Date: 07/04/2023 CLINICAL DATA:  Acute nonlocalized abdominal pain after peritoneal dialysis. Nausea, vomiting, and diarrhea. EXAM: CT ABDOMEN AND PELVIS WITHOUT CONTRAST TECHNIQUE: Multidetector CT imaging of the abdomen and pelvis was performed following the standard protocol without IV contrast. RADIATION DOSE REDUCTION: This exam was performed according to the departmental dose-optimization program which includes automated exposure control, adjustment of the mA and/or kV according to patient size and/or use of iterative reconstruction technique. COMPARISON:  12/01/2022 FINDINGS: Lower chest: Small left pleural effusion. Atelectasis or infiltration demonstrated in both lung bases. Cardiac enlargement. Hepatobiliary: No focal liver abnormality is seen. No gallstones, gallbladder wall thickening, or biliary dilatation. Pancreas: Diffuse pancreatic atrophy with scattered calcifications consistent with evidence of chronic pancreatitis. No acute inflammatory changes appreciated. Spleen: Normal in size without focal abnormality. Adrenals/Urinary Tract: No adrenal gland nodules. Kidneys  are symmetrical. No hydronephrosis or hydroureter. Bladder is decompressed but there appears to be bladder wall thickening.  This could be due to under distention or cystitis. Stomach/Bowel: Stomach is fluid-filled without distention. Small bowel and colon are mostly decompressed. Decompression limits evaluation but there is evidence of diffuse wall thickening in the small bowel and colon. This may indicate enterocolitis or reactive bowel wall thickening. No pneumatosis. Vascular/Lymphatic: Aortic atherosclerosis. No enlarged abdominal or pelvic lymph nodes. Reproductive: Uterus and bilateral adnexa are unremarkable. Other: Moderate free fluid in the abdomen with small amount of free air likely related to peritoneal dialysis. A peritoneal dialysis catheter is present, coiled in the pelvis. No loculated collections are identified. Prominent lymph nodes in the groin regions bilaterally. Largest on the right measures 2.6 cm diameter is incompletely included. This is similar to the previous study. Differential diagnosis could include inflammatory or reactive nodes versus lymphoproliferative process. Musculoskeletal: No acute bony abnormalities. IMPRESSION: 1. Moderate free fluid throughout the abdomen and pelvis with small amount of free air likely related to peritoneal dialysis. 2. Peritoneal dialysis catheter appears in place. No loculated collections. 3. Small and large bowel are decompressed but there is suggestion of wall thickening, possibly enterocolitis. 4. Bladder wall thickening may be due to under distention or cystitis. 5. Lymphadenopathy in the groin regions is similar to prior study, nonspecific. 6. Aortic atherosclerosis. 7. Chronic pancreatitis. 8. Infiltrates or atelectasis in both lung bases, possibly pneumonia. Small left pleural effusion. Electronically Signed   By: Burman Nieves M.D.   On: 07/04/2023 20:50   DG Chest 2 View Result Date: 07/04/2023 CLINICAL DATA:  Suspected sepsis EXAM: CHEST -  2 VIEW COMPARISON:  Chest x-ray 01/14/2023 FINDINGS: There are minimal strandy and patchy opacities in the anterior inferior left lower lobe. There is no pleural effusion or pneumothorax. The cardiomediastinal silhouette is within normal limits. No acute fractures are seen. IMPRESSION: Minimal strandy and patchy opacities in the anterior inferior left lower lobe, atelectasis versus pneumonia. Electronically Signed   By: Darliss Cheney M.D.   On: 07/04/2023 17:04    Procedures .Critical Care  Performed by: Ernie Avena, MD Authorized by: Ernie Avena, MD   Critical care provider statement:    Critical care time (minutes):  45   Critical care was necessary to treat or prevent imminent or life-threatening deterioration of the following conditions:  Sepsis   Critical care was time spent personally by me on the following activities:  Development of treatment plan with patient or surrogate, discussions with consultants, evaluation of patient's response to treatment, examination of patient, ordering and review of laboratory studies, ordering and review of radiographic studies, ordering and performing treatments and interventions, pulse oximetry, re-evaluation of patient's condition and review of old charts   Care discussed with: admitting provider       Medications Ordered in ED Medications  gentamicin cream (GARAMYCIN) 0.1 % 1 Application (has no administration in time range)  dialysis solution 1.5% low-MG/low-CA dianeal solution (has no administration in time range)  0.9 %  sodium chloride infusion (has no administration in time range)  norepinephrine (LEVOPHED) 4mg  in (0.016 mg/mL) premix infusion (2 mcg/min Intravenous New Bag/Given 07/04/23 2217)  docusate sodium (COLACE) capsule 100 mg (has no administration in time range)  polyethylene glycol (MIRALAX / GLYCOLAX) packet 17 g (has no administration in time range)  heparin injection 5,000 Units (has no administration in time range)   lactated ringers bolus 500 mL (0 mLs Intravenous Stopped 07/04/23 1805)  lactated ringers bolus 500 mL (0 mLs Intravenous Stopped 07/04/23 1903)  acetaminophen (TYLENOL) tablet 1,000 mg (1,000 mg Oral  Given 07/04/23 1801)  ceFEPIme (MAXIPIME) 2 g in sodium chloride 0.9 % 100 mL IVPB (0 g Intravenous Stopped 07/04/23 1835)  vancomycin (VANCOREADY) IVPB 1500 mg/300 mL (0 mg Intravenous Stopped 07/04/23 2209)  lactated ringers bolus 1,000 mL (0 mLs Intravenous Stopped 07/04/23 2209)  alteplase (CATHFLO ACTIVASE) injection 2 mg (2 mg Intracatheter Given 07/04/23 2133)  fentaNYL (SUBLIMAZE) injection 50 mcg (50 mcg Intravenous Given 07/04/23 2208)    ED Course/ Medical Decision Making/ A&P Clinical Course as of 07/04/23 2343  Mon Jul 04, 2023  1736 Temp(!): 100.7 F (38.2 C) [JL]  1736 Pulse Rate(!): 107 [JL]    Clinical Course User Index [JL] Ernie Avena, MD                                 Medical Decision Making Amount and/or Complexity of Data Reviewed Labs: ordered. Radiology: ordered.  Risk OTC drugs. Prescription drug management. Decision regarding hospitalization.    28 year old female with medical history significant for DM1, ESRD now on peritoneal dialysis presenting to the emergency department with abdominal pain.  The patient states that she has had nausea vomiting and diarrhea over the past 2 days.  She endorses severe lower abdominal pain.  She also has had a mild cough over the same amount of time.  She endorses subjective fevers and chills.  She has had multiple episodes of diarrhea as well as NBNB emesis.  Remainder of history limited by patient pain and acuity.  On arrival, the patient was afebrile, temperature 100.2, subsequently found to be febrile by rectal temperature temperature 100.7, tachycardic heart rate 100, mildly tachypneic on repeat evaluation respiratory rate 24, heart rates in the low 100s, initially hypertensive BP 82/46, saturating 93 to 100% on room air.   IV access was obtained the patient was administered LR for volume resuscitation.  Septic workup initiated to include blood cultures x 2.  Additionally, concern for infected peritoneal dialysate.  I spoke with on-call nephrology, Dr. Arrie Aran sending a dialysis nurse down to access the patient's peritoneal dialysate.  Body fluid labs ordered.  Laboratory evaluation significant for hCG negative, COVID, flu, RSV PCR testing negative, CMP with hypokalemia to 2.6, non-anion gap acidosis with a bicarbonate of 17, anion gap 12, creatinine and BUN elevated at 10.6 and 36, serum calcium 7.8 but corrects to normal after correction for albumin, initial lactic acid elevated at 2.1, CBC without a leukocytosis, anemia to 10.5.  Given the concern for peritonitis, a code sepsis was called.  A chest x-ray also revealed a possible infiltrate concerning for pneumonia.  The patient was covered with broad-spectrum antibiotics to include IV vancomycin and cefepime.  Patient and family were updated regarding the plan of care, plan for inpatient admission.   CT Abd Pel: IMPRESSION:  1. Moderate free fluid throughout the abdomen and pelvis with small  amount of free air likely related to peritoneal dialysis.  2. Peritoneal dialysis catheter appears in place. No loculated  collections.  3. Small and large bowel are decompressed but there is suggestion of  wall thickening, possibly enterocolitis.  4. Bladder wall thickening may be due to under distention or  cystitis.  5. Lymphadenopathy in the groin regions is similar to prior study,  nonspecific.  6. Aortic atherosclerosis.  7. Chronic pancreatitis.  8. Infiltrates or atelectasis in both lung bases, possibly  pneumonia. Small left pleural effusion.    The patient's peritoneal dialysate fluid was sampled  and was positive for infection with 1900 nucleated cells with neutrophilic predominance.  Concern for peritoneal dialysis associated peritonitis.,  Concern for  sepsis.  Patient following 2 L of fluid resuscitation had downtrending blood pressures and was started on Levophed.  Pulmonary critical care was consulted for admission. Of note CT with poss PNA which could also be source of infection.  Patient and family updated regarding the plan of care, admitted in critical addition to the ICU, Dr. Allena Katz accepting.   Final Clinical Impression(s) / ED Diagnoses Final diagnoses:  Peritonitis associated with peritoneal dialysis, initial encounter ([name])  Sepsis, due to unspecified organism, unspecified whether acute organ dysfunction present ([name])  Septic shock ([name])  Nausea vomiting and diarrhea    Rx / DC Orders ED Discharge Orders     None         Ernie Avena, MD 07/04/23 2344

## 2023-07-04 NOTE — ED Notes (Signed)
 Patient transported to CT

## 2023-07-04 NOTE — H&P (Signed)
 NAME:  Lindsey Murillo, MRN:  [id number], DOB:  Apr 14, 1996, LOS: 0 ADMISSION DATE:  07/04/2023, CONSULTATION DATE:  07/05/23 REFERRING MD:  ER, CHIEF COMPLAINT:  Abdominal pain   History of Present Illness:  28yo female pt with T1DM, HTN, autoimmune thyroiditis and ERSD - on PD , prior on HD. Patient admitted with 8/10 abdominal pain for 4 days, associated with cough, worsening, and some n/v/d thus patient sought medical attention. In the ER patient had peritoneal cultures drawn, concerning for viral gastroenteritis and peritonitis.  Nephrologist saw the patient in the ER. Patient will be admitted to the ICU on norepi, septic shock. Patient seen in the ICU room 25M-11C , awake and alert with her mother in the room.  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   Significant Hospital Events: Including procedures, antibiotic start and stop dates in addition to other pertinent events     Interim History / Subjective:  2/25 - admitted for septic shock / peritonitis  Objective   Blood pressure (!) 88/54, pulse 88, temperature 98.7 F (37.1 C), temperature source Oral, resp. rate (!) 27, height 5\' 8"  (1.727 m), weight 56.7 kg, SpO2 100%.        Intake/Output Summary (Last 24 hours) at 07/04/2023 2226 Last data filed at 07/04/2023 2209 Gross per 24 hour  Intake 2424.76 ml  Output --  Net 2424.76 ml   Filed Weights   07/04/23 1917  Weight: 56.7  kg    Examination: Physical Exam Vitals and nursing note reviewed.  Constitutional:      Appearance: She is well-developed.  HENT:     Head: Normocephalic and atraumatic.  Eyes:     Extraocular Movements: Extraocular movements intact.     Pupils: Pupils are equal, round, and reactive to light.  Cardiovascular:     Rate and Rhythm: Normal rate.     Heart sounds: Normal heart sounds.  Pulmonary:     Effort: Pulmonary effort is normal.     Breath sounds: Normal breath sounds.  Abdominal:     General: Bowel sounds are normal. There is distension.     Palpations: Abdomen is soft.     Tenderness: There is generalized abdominal tenderness. There is rebound.  Skin:    Findings: Rash present.  Neurological:     General: No focal deficit present.     Mental Status: She is alert.  Psychiatric:        Mood and Affect: Mood normal.        Behavior: Behavior normal.      Resolved Hospital Problem list     Assessment & Plan:   Peritonitis   - component of septic shock , concern for SBP as well  - f/u PD cultures  - resume Vanc/Maxipime for now - resume IV norepi as tolerated - CT abdomen/pelvis - noncontrast scan reviewed: 1. Moderate free fluid throughout the abdomen and pelvis with small amount of free air likely related to peritoneal dialysis. 2. Peritoneal dialysis catheter appears in place. No loculated collections. 3. Small and large bowel are decompressed but there is suggestion of wall  thickening, possibly enterocolitis. 4. Bladder wall thickening may be due to under distention or cystitis. 5. Lymphadenopathy in the groin regions is similar to prior study, nonspecific. 6. Aortic atherosclerosis. 7. Chronic pancreatitis. 8. Infiltrates or atelectasis in both lung bases, possibly pneumonia. Small left pleural effusion.  ESRD  - resume per Nephro Electrolyte replacement, potassium replaced Appreciate Nephro recs.  T1DM - tight glycemic control  Labs indicative  of renal failure and sepsis, continue to monitor. Home meds reviewed Further recs pending clinical course D/w patient and patient's mother in detail.    Best Practice (right click and "Reselect all SmartList Selections" daily)   Diet/type: Renal diet DVT prophylaxis prophylactic heparin  Pressure ulcer(s): N/A GI prophylaxis: H2B Lines: Dialysis Catheter and Arterial Line Foley:  N/A Code Status:  full code Last date of multidisciplinary goals of care discussion [07/05/23]  Labs   CBC: Recent Labs  Lab 07/04/23 1542  WBC 10.0  NEUTROABS 9.0*  HGB 10.5*  HCT 32.1*  MCV 89.7  PLT 244    Basic Metabolic Panel: Recent Labs  Lab 07/04/23 1542 07/04/23 2107  NA 134*  --   K 2.6*  --   CL 105  --   CO2 17*  --   GLUCOSE 133*  --   BUN 36*  --   CREATININE 10.60*  --   CALCIUM 7.8*  --   MG  --  1.4*   GFR: Estimated Creatinine Clearance: 7.1 mL/min (A) (by C-G formula based on SCr of 10.6 mg/dL (H)). Recent Labs  Lab 07/04/23 1542 07/04/23 1546 07/04/23 2109  WBC 10.0  --   --   LATICACIDVEN  --  2.1* 0.7    Liver Function Tests: Recent Labs  Lab 07/04/23 1542  AST 10*  ALT 10  ALKPHOS 84  BILITOT 0.6  PROT 5.4*  ALBUMIN 1.7*   No results for input(s): "LIPASE", "AMYLASE" in the last 168 hours. No results for input(s): "AMMONIA" in the last 168 hours.  ABG    Component Value Date/Time   HCO3 30.6 (H) 12/01/2022 1047   TCO2 24 01/12/2022 0721   ACIDBASEDEF 8.9 (H) 06/13/2021 0003   O2SAT 57.5 12/01/2022 1047     Coagulation Profile: Recent Labs  Lab 07/04/23 1542  INR 1.4*    Cardiac Enzymes: No results for input(s): "CKTOTAL", "CKMB", "CKMBINDEX", "TROPONINI" in the last 168 hours.  HbA1C: Hemoglobin A1C  Date/Time Value Ref Range Status  06/23/2023 03:48 PM 7.2 (A) 4.0 - 5.6 % Final  12/13/2018 12:00 AM >15.5  Final   HbA1c, POC (controlled diabetic range)  Date/Time Value Ref Range Status  01/21/2022 11:15 AM 8.3 (A) 0.0 -  7.0 % Final   HbA1c POC (<> result, manual entry)  Date/Time Value Ref Range Status  06/09/2018 09:28 AM >13 4.0 - 5.6 % Final   Hgb A1c MFr Bld  Date/Time Value Ref Range Status  12/14/2022 03:46 PM 10.5 (H) 4.8 - 5.6 % Final    Comment:             Prediabetes: 5.7 - 6.4          Diabetes: >6.4          Glycemic control for adults with diabetes: <7.0   07/20/2022 02:40 PM 10.5 (H) 4.8 - 5.6 % Final    Comment:             Prediabetes: 5.7 - 6.4          Diabetes: >6.4  Glycemic control for adults with diabetes: <7.0     CBG: Recent Labs  Lab 07/04/23 1937  GLUCAP 125*    Review of Systems:   Review of Systems  Constitutional: Negative.   HENT: Negative.    Eyes: Negative.   Respiratory: Negative.    Cardiovascular: Negative.   Gastrointestinal:  Positive for abdominal pain, nausea and vomiting.  Genitourinary: Negative.   Musculoskeletal: Negative.   Skin: Negative.   Neurological: Negative.   Endo/Heme/Allergies: Negative.   Psychiatric/Behavioral: Negative.       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   Surgical History:   Past Surgical History:  Procedure Laterality Date   AV FISTULA PLACEMENT Right 12/08/2021   Procedure: ARTERIOVENOUS  FISTULA CREATION VERSUS GRAFT;  Surgeon: Larina Earthly, MD;  Location: [facility];  Service: Vascular;  Laterality: Right;   BASCILIC VEIN TRANSPOSITION Right 01/12/2022    Procedure: RIGHT ARM SECOND STAGE BASILIC VEIN TRANSPOSITION;  Surgeon: Larina Earthly, MD;  Location: [facility];  Service: Vascular;  Laterality: Right;   BIOPSY  08/27/2016   Procedure: BIOPSY;  Surgeon: West Bali, MD;  Location: [facility];  Service: Endoscopy;;  duodenum; gastric   BIOPSY  06/27/2019   Procedure: BIOPSY;  Surgeon: Corbin Ade, MD;  Location: [facility];  Service: Endoscopy;;   BIOPSY  02/09/2022   Procedure: BIOPSY;  Surgeon: Lanelle Bal, DO;  Location: [facility];  Service: Endoscopy;;   BREAST CYST EXCISION Right 11/12/2021   Procedure: RIGHT BREAST ABSCESS INCISION AND DRAINAGE;  Surgeon: Emelia Loron, MD;  Location[facility] OR;  Service: General;  Laterality: Right;  LMA   COLONOSCOPY     COLONOSCOPY WITH PROPOFOL N/A 02/09/2022   Procedure: COLONOSCOPY WITH PROPOFOL;  Surgeon: Lanelle Bal, DO;  Location: [facility];  Service: Endoscopy;  Laterality: N/A;  7:30am, asa 3, dialysis pt   ESOPHAGOGASTRODUODENOSCOPY N/A 08/27/2016   Dr. Darrick Penna: mild gastritis. Negative celiac. No obvious source for dyspepsia/diarrhea   ESOPHAGOGASTRODUODENOSCOPY (EGD) WITH PROPOFOL N/A 06/27/2019   rourk: Focal abnormality of the gastric mucosa likely due to trauma (heaving).  Biopsy showed mild gastritis, negative for H. pylori.  Esophageal dilation for history of dysphagia but normal-appearing esophagus.   ESOPHAGOGASTRODUODENOSCOPY (EGD) WITH PROPOFOL N/A 02/09/2022   Procedure: ESOPHAGOGASTRODUODENOSCOPY (EGD) WITH PROPOFOL;  Surgeon: Lanelle Bal, DO;  Location: [facility];  Service: Endoscopy;  Laterality: N/A;   IR FLUORO GUIDE CV LINE RIGHT  10/13/2021   IR FLUORO GUIDE CV LINE RIGHT  10/19/2021   IR US GUIDE VASC ACCESS RIGHT  10/13/2021   IRRIGATION AND DEBRIDEMENT ABSCESS N/A 10/04/2021   Procedure: IRRIGATION AND DEBRIDEMENT NECK ABSCESS;  Surgeon: Axel Filler, MD;  Location: [facility] OR;  Service: General;  Laterality: N/A;     Social History:   reports  that she has never smoked. She has never been exposed to tobacco smoke. She has never used smokeless tobacco. She reports that she does not drink alcohol and does not use drugs.   Family History:  Her family history includes Alcohol abuse in her  paternal grandfather; Cancer in her father; Diabetes in her mother; Huntington's disease in her maternal grandfather. There is no history of Colon cancer or Colon polyps.   Allergies No Known Allergies   Home Medications  Prior to Admission medications   Medication Sig Start Date End Date Taking? Authorizing Provider  B Complex-C-Folic Acid (DIALYVITE 800) 0.8 MG TABS Take 1 tablet by mouth daily. 11/16/21  Yes [provider]  calcitRIOL (ROCALTROL) 0.5 MCG capsule Take 1 mcg by mouth daily.   Yes [provider]  cinacalcet (SENSIPAR) 30 MG tablet Take 30 mg by mouth. 06/10/23  Yes [provider]  gentamicin cream (GARAMYCIN) 0.1 % Apply 1 Application topically daily.   Yes [provider]  Insulin Aspart, w/Niacinamide, (FIASP) 100 UNIT/ML SOLN Inject 5-8 Units as directed with breakfast, with lunch, and with evening meal. Sliding Scale   Yes [provider]  medroxyPROGESTERone (PROVERA) 10 MG tablet Take provera 10 mg for 10 days every 3 months 11/16/22  Yes Cyril Mourning A, NP  ondansetron (ZOFRAN-ODT) 4 MG disintegrating tablet Take 1 tablet (4 mg total) by mouth every 8 (eight) hours as needed for nausea or vomiting. 06/08/23  Yes Pollyann Savoy, MD  pantoprazole (PROTONIX) 40 MG tablet Take 1 tablet (40 mg total) by mouth 2 (two) times daily before a meal. 03/22/23 03/16/24 Yes Mahon, Courtney L, NP  potassium chloride SA (KLOR-CON M) 20 MEQ tablet Take 20 mEq by mouth 2 (two) times daily. 06/23/23  Yes [provider]  rosuvastatin (CRESTOR) 10 MG tablet Take 1 tablet (10 mg total) by mouth daily. 03/15/23  Yes Cook, Jayce G, DO  scopolamine (TRANSDERM-SCOP) 1 MG/3DAYS Place 1 patch (1.5 mg  total) onto the skin every 3 (three) days. 02/02/23  Yes Roemhildt, Lorin T, PA-C  sucroferric oxyhydroxide (VELPHORO) 500 MG chewable tablet Chew 1,000 mg by mouth 3 (three) times daily with meals. 11/16/21  Yes [provider]  TRESIBA FLEXTOUCH 100 UNIT/ML FlexTouch Pen Inject 15 Units into the skin at bedtime. 06/23/23  Yes Reardon, Freddi Starr, NP  VENTOLIN HFA 108 (90 Base) MCG/ACT inhaler Inhale 2 puffs into the lungs every 4 (four) hours as needed for wheezing or shortness of breath. 2 puffs once every 4 hours as needed for cough, wheeze, shortness of breath and chest tightness. May use 2 puffs 5 to 15 minutes before activity to decrease cough or wheeze. 03/24/23  Yes Padgett, Pilar Grammes, MD  Vitamin D, Ergocalciferol, (DRISDOL) 1.25 MG (50000 UNIT) CAPS capsule Take 50,000 Units by mouth every 7 (seven) days.   Yes [provider]  acetaminophen (TYLENOL) 325 MG tablet Take 325 mg by mouth every 6 (six) hours as needed for mild pain (pain score 1-3) or moderate pain (pain score 4-6). Patient not taking: Reported on 07/04/2023 12/24/21   [provider]  cetirizine HCl (ZYRTEC) 5 MG/5ML SOLN TAKE 1.25 ML TWICE A DAY AS NEEDED FOR A RUNNY NOSE OR ITCH Patient not taking: Reported on 07/04/2023 05/09/23   Marcelyn Bruins, MD  Continuous Glucose Receiver (DEXCOM G7 RECEIVER) DEVI by Does not apply route.    [provider]  Continuous Glucose Sensor (DEXCOM G7 SENSOR) MISC by Does not apply route.    [provider]  fluticasone (FLONASE) 50 MCG/ACT nasal spray Place 2 sprays into both nostrils daily as needed for allergies or rhinitis. Patient not taking: Reported on 07/04/2023 12/23/22   Hetty Blend, FNP  SSD 1 % cream APPLY CREAM  TO WOUND DAILY AND COVER WITH GAUZE DRESSING 04/01/23   McCaughan, Dia D, DPM     Critical care time:   CRITICAL CARE Performed by: Madaline Brilliant DO - Pulmonary/Critical Care Medicine   Total critical care  time: 60 minutes  Critical care time was exclusive of separately billable procedures and treating other patients.  Critical care was necessary to treat or prevent imminent or life-threatening deterioration.  Critical care was time spent personally by me on the following activities: development of treatment plan with patient and/or surrogate as well as nursing, discussions with consultants, evaluation of patient's response to treatment, examination of patient, obtaining history from patient or surrogate, ordering and performing treatments and interventions, ordering and review of laboratory studies, ordering and review of radiographic studies, pulse oximetry and re-evaluation of patient's condition.

## 2023-07-04 NOTE — Progress Notes (Addendum)
   07/04/23 2100  Pain Assessment  Pain Scale 0-10  Pain Score 5  Pain Location Abdomen  Pain Descriptors / Indicators Aching  Neurological  Level of Consciousness Alert  Orientation Level Oriented X4  Respiratory  Respiratory Pattern Regular  Cough None  Cardiac  Pulse Regular   Cath flow activase instill in peritoneal catheter port. PD catheter site clean & dressing changed.PD fluid sample in Lab for cell count and Gram stain analysis.

## 2023-07-04 NOTE — Sepsis Progress Note (Signed)
 Elink will follow per sepsis protocol.

## 2023-07-04 NOTE — ED Triage Notes (Signed)
 Pt reports ABD pain after peritoneal dialysis. Endorses N/V/D. Tenderness on exam.

## 2023-07-04 NOTE — ED Notes (Signed)
 67M called.

## 2023-07-04 NOTE — Consult Note (Signed)
 [facility] Renal Consultation Note    Indication for Consultation:  Management of ESRD/hemodialysis; anemia, hypertension/volume and secondary hyperparathyroidism  HPI: Lindsey Murillo is a 28 y.o. female with a PMH significant for type 1 DM,  HTN, autoimmune thyroiditis, and ESRD on CCPD who presented to [facility] ED today after developing abdominal pain this morning.  She has not been feeling well and has had N/V/D for the past 4-5 days as well as a cough.  She denies any cloudy PD fluid and reports that her abdominal pain just started today.  In the ED, temp max 100.7, bp 82/46, HR 110, SpO2 93% on room air.  Labs notable for WBC 10, Hgb 10.5, plt 244, Na 134, K 2.6, Co2 17, BUN 36, Cr 10.6, Ca 7.8, alb 1.7.  Presentation concerning for SBP and we were consulted to manage her PD and send off PD fluid for studies during her hospitalization.    Of note, she was not doing her PD correctly for the past week and was 10 kg above edw.  She was not doing the proper amount of exchanges and was reabsorbing.  She declined coming in for HD session to help with her volume, however she declined and performed CAPD at home.  She now appears hypovolemic due to vomiting and diarrhea which predated her abdominal pain and may have a viral gastroenteritis.  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 10/21/2021   Diabetic autonomic neuropathy ([name])    Diabetic peripheral neuropathy ([name])    Environmental allergies    Goiter    History of blood transfusion    Hypertension    no meds currently   Hypoglycemia associated with diabetes ([name])    Musculoskeletal pain 04/20/2022   Tachycardia    Thyroiditis, autoimmune    Type 1 diabetes mellitus in patient age 52-19  years with HbA1C goal below 7.5    Past Surgical History:  Procedure Laterality Date   AV FISTULA PLACEMENT Right 12/08/2021   Procedure: ARTERIOVENOUS  FISTULA CREATION VERSUS GRAFT;  Surgeon: Larina Earthly, MD;  Location: [facility] OR[facility];  Service: Vascular;  Laterality: Right;   BASCILIC VEIN TRANSPOSITION Right 01/12/2022   Procedure: RIGHT ARM SECOND STAGE BASILIC VEIN TRANSPOSITION;  Surgeon: Larina Earthly, MD;  Location: [facility];  Service: Vascular;  Laterality: Right;   BIOPSY  08/27/2016   Procedure: BIOPSY;  Surgeon: West Bali, MD;  Location: [facility];  Service: Endoscopy;;  duodenum; gastric   BIOPSY  06/27/2019   Procedure: BIOPSY;  Surgeon: Corbin Ade, MD;  Location: [facility];  Service: Endoscopy;;   BIOPSY  02/09/2022   Procedure: BIOPSY;  Surgeon: Lanelle Bal, DO;  Location: [facility];  Service: Endoscopy;;   BREAST CYST EXCISION Right 11/12/2021   Procedure: RIGHT BREAST ABSCESS INCISION AND DRAINAGE;  Surgeon: Emelia Loron, MD;  Location: [facility] OR;  Service: General;  Laterality: Right;  LMA   COLONOSCOPY     COLONOSCOPY WITH PROPOFOL N/A 02/09/2022   Procedure: COLONOSCOPY WITH PROPOFOL;  Surgeon: Lanelle Bal, DO;  Location: [facility];  Service: Endoscopy;  Laterality: N/A;  7:30am, asa 3, dialysis pt   ESOPHAGOGASTRODUODENOSCOPY N/A 08/27/2016   Dr. Darrick Penna: mild gastritis. Negative celiac. No obvious  source for dyspepsia/diarrhea   ESOPHAGOGASTRODUODENOSCOPY (EGD) WITH PROPOFOL N/A 06/27/2019   rourk: Focal abnormality of the gastric mucosa likely due to trauma (heaving).  Biopsy showed mild gastritis, negative for H. pylori.  Esophageal dilation for history of dysphagia but normal-appearing esophagus.   ESOPHAGOGASTRODUODENOSCOPY (EGD) WITH PROPOFOL N/A 02/09/2022   Procedure: ESOPHAGOGASTRODUODENOSCOPY (EGD) WITH PROPOFOL;  Surgeon: Lanelle Bal, DO;  Location: [facility];  Service: Endoscopy;  Laterality: N/A;   IR FLUORO GUIDE CV LINE RIGHT   10/13/2021   IR FLUORO GUIDE CV LINE RIGHT  10/19/2021   IR US GUIDE VASC ACCESS RIGHT  10/13/2021   IRRIGATION AND DEBRIDEMENT ABSCESS N/A 10/04/2021   Procedure: IRRIGATION AND DEBRIDEMENT NECK ABSCESS;  Surgeon: Axel Filler, MD;  Location: [facility] OR;  Service: General;  Laterality: N/A;   Family History:   Family History  Problem Relation Age of Onset   Alcohol abuse Paternal Grandfather    Huntington's disease Maternal Grandfather    Cancer Father        pancreatic   Diabetes Mother        Type II DM   Colon cancer Neg Hx    Colon polyps Neg Hx    Social History:  reports that she has never smoked. She has never been exposed to tobacco smoke. She has never used smokeless tobacco. She reports that she does not drink alcohol and does not use drugs. No Known Allergies Prior to Admission medications   Medication Sig Start Date End Date Taking? Authorizing Provider  B Complex-C-Folic Acid (DIALYVITE 800) 0.8 MG TABS Take 1 tablet by mouth daily. 11/16/21  Yes [provider]  calcitRIOL (ROCALTROL) 0.5 MCG capsule Take 1 mcg by mouth daily.   Yes [provider]  cinacalcet (SENSIPAR) 30 MG tablet Take 30 mg by mouth. 06/10/23  Yes [provider]  gentamicin cream (GARAMYCIN) 0.1 % Apply 1 Application topically daily.   Yes [provider]  Insulin Aspart, w/Niacinamide, (FIASP) 100 UNIT/ML SOLN Inject 5-8 Units as directed with breakfast, with lunch, and with evening meal. Sliding Scale   Yes [provider]  medroxyPROGESTERone (PROVERA) 10 MG tablet Take provera 10 mg for 10 days every 3 months 11/16/22  Yes Cyril Mourning A, NP  ondansetron (ZOFRAN-ODT) 4 MG disintegrating tablet Take 1 tablet (4 mg total) by mouth every 8 (eight) hours as needed for nausea or vomiting. 06/08/23  Yes Pollyann Savoy, MD  pantoprazole (PROTONIX) 40 MG tablet Take 1 tablet (40 mg total) by mouth 2 (two) times daily before a meal. 03/22/23 03/16/24 Yes Mahon,  Courtney L, NP  potassium chloride SA (KLOR-CON M) 20 MEQ tablet Take 20 mEq by mouth 2 (two) times daily. 06/23/23  Yes [provider]  rosuvastatin (CRESTOR) 10 MG tablet Take 1 tablet (10 mg total) by mouth daily. 03/15/23  Yes Cook, Jayce G, DO  scopolamine (TRANSDERM-SCOP) 1 MG/3DAYS Place 1 patch (1.5 mg total) onto the skin every 3 (three) days. 02/02/23  Yes Roemhildt, Lorin T, PA-C  sucroferric oxyhydroxide (VELPHORO) 500 MG chewable tablet Chew 1,000 mg by mouth 3 (three) times daily with meals. 11/16/21  Yes [provider]  TRESIBA FLEXTOUCH 100 UNIT/ML FlexTouch Pen Inject 15 Units into the skin at bedtime. 06/23/23  Yes Reardon, Freddi Starr, NP  VENTOLIN HFA 108 (90 Base) MCG/ACT inhaler Inhale 2 puffs into the lungs every 4 (four) hours as needed for wheezing or shortness of breath. 2 puffs once every 4 hours as needed  for cough, wheeze, shortness of breath and chest tightness. May use 2 puffs 5 to 15 minutes before activity to decrease cough or wheeze. 03/24/23  Yes Padgett, Pilar Grammes, MD  Vitamin D, Ergocalciferol, (DRISDOL) 1.25 MG (50000 UNIT) CAPS capsule Take 50,000 Units by mouth every 7 (seven) days.   Yes [provider]  acetaminophen (TYLENOL) 325 MG tablet Take 325 mg by mouth every 6 (six) hours as needed for mild pain (pain score 1-3) or moderate pain (pain score 4-6). Patient not taking: Reported on 07/04/2023 12/24/21   [provider]  cetirizine HCl (ZYRTEC) 5 MG/5ML SOLN TAKE 1.25 ML TWICE A DAY AS NEEDED FOR A RUNNY NOSE OR ITCH Patient not taking: Reported on 07/04/2023 05/09/23   Marcelyn Bruins, MD  Continuous Glucose Receiver (DEXCOM G7 RECEIVER) DEVI by Does not apply route.    [provider]  Continuous Glucose Sensor (DEXCOM G7 SENSOR) MISC by Does not apply route.    [provider]  fluticasone (FLONASE) 50 MCG/ACT nasal spray Place 2 sprays into both nostrils daily as needed for allergies or  rhinitis. Patient not taking: Reported on 07/04/2023 12/23/22   Hetty Blend, FNP  SSD 1 % cream APPLY CREAM TO WOUND DAILY AND COVER WITH GAUZE DRESSING 04/01/23   McCaughan, Dia D, DPM   Current Facility-Administered Medications  Medication Dose Route Frequency Provider Last Rate Last Admin   dialysis solution 1.5% low-MG/low-CA dianeal solution   Intraperitoneal Q24H Terrial Rhodes, MD       gentamicin cream (GARAMYCIN) 0.1 % 1 Application  1 Application Topical Daily Terrial Rhodes, MD       lactated ringers bolus 500 mL  500 mL Intravenous Once Ernie Avena, MD 500 mL/hr at 07/04/23 1803 500 mL at 07/04/23 1803   vancomycin (VANCOREADY) IVPB 1500 mg/300 mL  1,500 mg Intravenous Once Daylene Posey, [facility]       Current Outpatient Medications  Medication Sig Dispense Refill   B Complex-C-Folic Acid (DIALYVITE 800) 0.8 MG TABS Take 1 tablet by mouth daily.     calcitRIOL (ROCALTROL) 0.5 MCG capsule Take 1 mcg by mouth daily.     cinacalcet (SENSIPAR) 30 MG tablet Take 30 mg by mouth.     gentamicin cream (GARAMYCIN) 0.1 % Apply 1 Application topically daily.     Insulin Aspart, w/Niacinamide, (FIASP) 100 UNIT/ML SOLN Inject 5-8 Units as directed with breakfast, with lunch, and with evening meal. Sliding Scale     medroxyPROGESTERone (PROVERA) 10 MG tablet Take provera 10 mg for 10 days every 3 months 30 tablet 2   ondansetron (ZOFRAN-ODT) 4 MG disintegrating tablet Take 1 tablet (4 mg total) by mouth every 8 (eight) hours as needed for nausea or vomiting. 20 tablet 0   pantoprazole (PROTONIX) 40 MG tablet Take 1 tablet (40 mg total) by mouth 2 (two) times daily before a meal. 180 tablet 3   potassium chloride SA (KLOR-CON M) 20 MEQ tablet Take 20 mEq by mouth 2 (two) times daily.     rosuvastatin (CRESTOR) 10 MG tablet Take 1 tablet (10 mg total) by mouth daily. 90 tablet 3   scopolamine (TRANSDERM-SCOP) 1 MG/3DAYS Place 1 patch (1.5 mg total) onto the skin every 3 (three) days. 10  patch 12   sucroferric oxyhydroxide (VELPHORO) 500 MG chewable tablet Chew 1,000 mg by mouth 3 (three) times daily with meals.     TRESIBA FLEXTOUCH 100 UNIT/ML FlexTouch Pen Inject 15 Units into the skin at bedtime. 15 mL  3   VENTOLIN HFA 108 (90 Base) MCG/ACT inhaler Inhale 2 puffs into the lungs every 4 (four) hours as needed for wheezing or shortness of breath. 2 puffs once every 4 hours as needed for cough, wheeze, shortness of breath and chest tightness. May use 2 puffs 5 to 15 minutes before activity to decrease cough or wheeze. 18 g 1   Vitamin D, Ergocalciferol, (DRISDOL) 1.25 MG (50000 UNIT) CAPS capsule Take 50,000 Units by mouth every 7 (seven) days.     acetaminophen (TYLENOL) 325 MG tablet Take 325 mg by mouth every 6 (six) hours as needed for mild pain (pain score 1-3) or moderate pain (pain score 4-6). (Patient not taking: Reported on 07/04/2023)     cetirizine HCl (ZYRTEC) 5 MG/5ML SOLN TAKE 1.25 ML TWICE A DAY AS NEEDED FOR A RUNNY NOSE OR ITCH (Patient not taking: Reported on 07/04/2023) 118 mL 0   Continuous Glucose Receiver (DEXCOM G7 RECEIVER) DEVI by Does not apply route.     Continuous Glucose Sensor (DEXCOM G7 SENSOR) MISC by Does not apply route.     fluticasone (FLONASE) 50 MCG/ACT nasal spray Place 2 sprays into both nostrils daily as needed for allergies or rhinitis. (Patient not taking: Reported on 07/04/2023) 16 g 5   SSD 1 % cream APPLY CREAM TO WOUND DAILY AND COVER WITH GAUZE DRESSING 50 g 0   Labs: Basic Metabolic Panel: Recent Labs  Lab 07/04/23 1542  NA 134*  K 2.6*  CL 105  CO2 17*  GLUCOSE 133*  BUN 36*  CREATININE 10.60*  CALCIUM 7.8*   Liver Function Tests: Recent Labs  Lab 07/04/23 1542  AST 10*  ALT 10  ALKPHOS 84  BILITOT 0.6  PROT 5.4*  ALBUMIN 1.7*   No results for input(s): "LIPASE", "AMYLASE" in the last 168 hours. No results for input(s): "AMMONIA" in the last 168 hours. CBC: Recent Labs  Lab 07/04/23 1542  WBC 10.0  NEUTROABS  9.0*  HGB 10.5*  HCT 32.1*  MCV 89.7  PLT 244   Cardiac Enzymes: No results for input(s): "CKTOTAL", "CKMB", "CKMBINDEX", "TROPONINI" in the last 168 hours. CBG: No results for input(s): "GLUCAP" in the last 168 hours. Iron Studies: No results for input(s): "IRON", "TIBC", "TRANSFERRIN", "FERRITIN" in the last 72 hours. Studies/Results: DG Chest 2 View Result Date: 07/04/2023 CLINICAL DATA:  Suspected sepsis EXAM: CHEST - 2 VIEW COMPARISON:  Chest x-ray 01/14/2023 FINDINGS: There are minimal strandy and patchy opacities in the anterior inferior left lower lobe. There is no pleural effusion or pneumothorax. The cardiomediastinal silhouette is within normal limits. No acute fractures are seen. IMPRESSION: Minimal strandy and patchy opacities in the anterior inferior left lower lobe, atelectasis versus pneumonia. Electronically Signed   By: Darliss Cheney M.D.   On: 07/04/2023 17:04    ROS: Pertinent items are noted in HPI. Physical Exam: Vitals:   07/04/23 1630 07/04/23 1700 07/04/23 1735 07/04/23 1830  BP: 102/74 (!) 80/67  (!) 94/54  Pulse: (!) 105 (!) 108 (!) 107 (!) 110  Resp: (!) 24 18 (!) 31 (!) 22  Temp:   (!) 100.7 F (38.2 C)   TempSrc:   Rectal   SpO2: 99% 100% 99% 100%      Weight change:   Intake/Output Summary (Last 24 hours) at 07/04/2023 1900 Last data filed at 07/04/2023 1805 Gross per 24 hour  Intake 521.18 ml  Output --  Net 521.18 ml   BP (!) 94/54   Pulse (!) 110  Temp (!) 100.7 F (38.2 C) (Rectal)   Resp (!) 22   SpO2 100%  General appearance: fatigued and ill-appearing Head: Normocephalic, without obvious abnormality, atraumatic Resp: clear to auscultation bilaterally Cardio: tachycardic, no rub GI: abnormal findings:  distended, guarding, rebound tenderness, and PD catheter in place in RLQ without erythema or drainage from exit site Extremities: edema trace pretibial edema bilaterally and rue avf +T/B Dialysis Access:  Dialysis Orders: Center:   [facility]  on CCPD .  Data Element Value  Order Date/Time July 07, 2023  Frequency 7X Week  Treatment Days [id number]  Estimated Dry Weight 50 kg  Calcium Content in Bag (mEq/L) 2.5 mEq/L  Magnesium Content in Bag (mEq/L) 0.5 mEq/L  Dextrose Content in Bag % 1.5; 2.5; 4.25%  Number of Cycler Exchanges 6  Fill Volume 2400 mL  Total Cycler Therapy Volume 16109 mL  Total Cycler Therapy Time 720 min  Average Dwell Time 90 min  Icodextrin (Extraneal) Long Dwell  Number of Daytime Exchanges 0  Daytime Exchange Method Cycler  Dialysis Access Peritoneal Dialysis-PD Catheter-Double Cuff Coiled, Right Lower Quadrant of Abdomen Access Placed on May 24, 2023    Assessment/Plan:  Abdominal pain - worrisome for SBP.  Have ordered PD fluid for cell count with diff, gram stain, and culture and sensitivities.  Would ask pharmacy to dose vancomycin and fortaz after PD fluid is sent for evaluation.  Pain meds per primary svc.  ESRD -  will plan on CCPD tonight using all 1.5% bags given hypotension.   Avoid nephrotoxic medications including NSAIDs and iodinated intravenous contrast exposure unless the latter is absolutely indicated.   Preferred narcotic agents for pain control are hydromorphone, fentanyl, and methadone. Morphine should not be used.  Avoid Baclofen and avoid oral sodium phosphate and magnesium citrate based laxatives / bowel preps.  Continue strict Input and Output monitoring.  Will monitor the patient closely with you and intervene or adjust therapy as indicated by changes in clinical status/labs   Type 1 DM - per primary svc  Hypertension/volume  - despite trace pretibial edema and being above edw, she appears volume depleted and hypotensive.  She was given lactated ringers in the ED and will continue to follow.    Anemia  - Hgb stable  Metabolic bone disease -   continue with home meds when she is able to tolerate po  Nutrition -  npo for now and advance slowly given h/o  N/V/D  Hypokalemia - likely from GI losses.  Will replete with IV KCl gently and follow.  Mg level pending.  She received 1 liter of LR, will recheck K level.  Irena Cords, MD [facility], [facility] 07/04/2023, 7:00 PM

## 2023-07-04 NOTE — Progress Notes (Signed)
 ED Pharmacy Antibiotic Sign Off An antibiotic consult was received from an ED provider for vancomycin and cefepime per pharmacy dosing for sepsis. A chart review was completed to assess appropriateness. PD patient  The following one time order(s) were placed:  Vancomycin 1500 mg IV x 1 Cefepime 2g IV x 1  Further antibiotic and/or antibiotic pharmacy consults should be ordered by the admitting provider if indicated.   Thank you for allowing pharmacy to be a part of this patient's care.   Daylene Posey, [place]  Clinical Pharmacist 07/04/23 5:51 PM

## 2023-07-05 DIAGNOSIS — R110 Nausea: Secondary | ICD-10-CM

## 2023-07-05 DIAGNOSIS — N186 End stage renal disease: Secondary | ICD-10-CM | POA: Diagnosis not present

## 2023-07-05 DIAGNOSIS — L8990 Pressure ulcer of unspecified site, unspecified stage: Secondary | ICD-10-CM | POA: Insufficient documentation

## 2023-07-05 DIAGNOSIS — E109 Type 1 diabetes mellitus without complications: Secondary | ICD-10-CM

## 2023-07-05 DIAGNOSIS — A419 Sepsis, unspecified organism: Secondary | ICD-10-CM | POA: Diagnosis not present

## 2023-07-05 DIAGNOSIS — T8571XA Infection and inflammatory reaction due to peritoneal dialysis catheter, initial encounter: Secondary | ICD-10-CM | POA: Diagnosis not present

## 2023-07-05 DIAGNOSIS — E876 Hypokalemia: Secondary | ICD-10-CM

## 2023-07-05 DIAGNOSIS — R6521 Severe sepsis with septic shock: Secondary | ICD-10-CM | POA: Diagnosis not present

## 2023-07-05 LAB — BLOOD CULTURE ID PANEL (REFLEXED) - BCID2

## 2023-07-05 LAB — CBC
HCT: 31.9 % — ABNORMAL LOW (ref 36.0–46.0)
HCT: 31 % — ABNORMAL LOW (ref 36.0–46.0)
Hemoglobin: 10.6 g/dL — ABNORMAL LOW (ref 12.0–15.0)
Hemoglobin: 10.3 g/dL — ABNORMAL LOW (ref 12.0–15.0)
MCH: 29.4 pg (ref 26.0–34.0)
MCH: 29.2 pg (ref 26.0–34.0)
MCHC: 33.2 g/dL (ref 30.0–36.0)
MCHC: 33.2 g/dL (ref 30.0–36.0)
MCV: 88.6 fL (ref 80.0–100.0)
MCV: 87.8 fL (ref 80.0–100.0)
Platelets: 256 10*3/uL (ref 150–400)
Platelets: 257 10*3/uL (ref 150–400)
RBC: 3.6 MIL/uL — ABNORMAL LOW (ref 3.87–5.11)
RBC: 3.53 MIL/uL — ABNORMAL LOW (ref 3.87–5.11)
RDW: 18.8 % — ABNORMAL HIGH (ref 11.5–15.5)
RDW: 19 % — ABNORMAL HIGH (ref 11.5–15.5)
WBC: 20.6 10*3/uL — ABNORMAL HIGH (ref 4.0–10.5)
WBC: 20.5 10*3/uL — ABNORMAL HIGH (ref 4.0–10.5)
nRBC: 0 % (ref 0.0–0.2)
nRBC: 0 % (ref 0.0–0.2)

## 2023-07-05 LAB — GLUCOSE, CAPILLARY
Glucose-Capillary: 94 mg/dL (ref 70–99)
Glucose-Capillary: 81 mg/dL (ref 70–99)
Glucose-Capillary: 101 mg/dL — ABNORMAL HIGH (ref 70–99)
Glucose-Capillary: 97 mg/dL (ref 70–99)
Glucose-Capillary: 124 mg/dL — ABNORMAL HIGH (ref 70–99)

## 2023-07-05 LAB — RENAL FUNCTION PANEL
Albumin: <1.5 g/dL — ABNORMAL LOW (ref 3.5–5.0)
Anion gap: 10 (ref 5–15)
BUN: 36 mg/dL — ABNORMAL HIGH (ref 6–20)
CO2: 19 mmol/L — ABNORMAL LOW (ref 22–32)
Calcium: 7.3 mg/dL — ABNORMAL LOW (ref 8.9–10.3)
Chloride: 106 mmol/L (ref 98–111)
Creatinine, Ser: 10.21 mg/dL — ABNORMAL HIGH (ref 0.44–1.00)
GFR, Estimated: 5 mL/min — ABNORMAL LOW (ref >60)
Glucose, Bld: 107 mg/dL — ABNORMAL HIGH (ref 70–99)
Phosphorus: 4.2 mg/dL (ref 2.5–4.6)
Potassium: 2.9 mmol/L — ABNORMAL LOW (ref 3.5–5.1)
Sodium: 135 mmol/L (ref 135–145)

## 2023-07-05 LAB — BASIC METABOLIC PANEL
Anion gap: 11 (ref 5–15)
BUN: 38 mg/dL — ABNORMAL HIGH (ref 6–20)
CO2: 18 mmol/L — ABNORMAL LOW (ref 22–32)
Calcium: 7.2 mg/dL — ABNORMAL LOW (ref 8.9–10.3)
Chloride: 105 mmol/L (ref 98–111)
Creatinine, Ser: 10.37 mg/dL — ABNORMAL HIGH (ref 0.44–1.00)
GFR, Estimated: 5 mL/min — ABNORMAL LOW (ref >60)
Glucose, Bld: 98 mg/dL (ref 70–99)
Potassium: 2.5 mmol/L — CL (ref 3.5–5.1)
Sodium: 134 mmol/L — ABNORMAL LOW (ref 135–145)

## 2023-07-05 LAB — PHOSPHORUS: Phosphorus: 4.2 mg/dL (ref 2.5–4.6)

## 2023-07-05 LAB — BODY FLUID CELL COUNT WITH DIFFERENTIAL
Eos, Fluid: 0 %
Lymphs, Fluid: 0 %
Monocyte-Macrophage-Serous Fluid: 14 % — ABNORMAL LOW (ref 50–90)
Neutrophil Count, Fluid: 86 % — ABNORMAL HIGH (ref 0–25)
Total Nucleated Cell Count, Fluid: 21250 uL — ABNORMAL HIGH (ref 0–1000)

## 2023-07-05 LAB — MAGNESIUM
Magnesium: 2 mg/dL (ref 1.7–2.4)
Magnesium: 1.9 mg/dL (ref 1.7–2.4)

## 2023-07-05 LAB — MRSA NEXT GEN BY PCR, NASAL: MRSA by PCR Next Gen: NOT DETECTED

## 2023-07-05 LAB — HIV ANTIBODY (ROUTINE TESTING W REFLEX): HIV Screen 4th Generation wRfx: NONREACTIVE

## 2023-07-05 LAB — POTASSIUM: Potassium: 3.5 mmol/L (ref 3.5–5.1)

## 2023-07-05 MED ORDER — DELFLEX-LC/1.5% DEXTROSE 344 MOSM/L IP SOLN: INTRAPERITONEAL | Status: DC

## 2023-07-05 MED ORDER — POTASSIUM CHLORIDE 20 MEQ PO PACK
40.0000 meq | PACK | Freq: Once | ORAL | Status: AC
  Administered 2023-07-05: 40 meq via ORAL
  Filled 2023-07-05: qty 2

## 2023-07-05 MED ORDER — DEXTROSE 5 % IV SOLN: 500.0000 mg | Freq: Two times a day (BID) | INTRAVENOUS | Status: DC

## 2023-07-05 MED ORDER — CINACALCET HCL 30 MG PO TABS
30.0000 mg | ORAL_TABLET | Freq: Every day | ORAL | Status: DC
  Administered 2023-07-05 – 2023-07-09 (×5): 30 mg via ORAL
  Filled 2023-07-05 (×6): qty 1

## 2023-07-05 MED ORDER — ONDANSETRON HCL 4 MG/2ML IJ SOLN
4.0000 mg | Freq: Four times a day (QID) | INTRAMUSCULAR | Status: DC | PRN
  Administered 2023-07-06 – 2023-07-12 (×7): 4 mg via INTRAVENOUS
  Filled 2023-07-05 (×7): qty 2

## 2023-07-05 MED ORDER — INSULIN GLARGINE 100 UNIT/ML ~~LOC~~ SOLN
5.0000 [IU] | Freq: Every day | SUBCUTANEOUS | Status: DC
  Administered 2023-07-05: 5 [IU] via SUBCUTANEOUS
  Filled 2023-07-05 (×2): qty 0.05

## 2023-07-05 MED ORDER — VANCOMYCIN HCL IN DEXTROSE 1-5 GM/200ML-% IV SOLN
1000.0000 mg | Freq: Two times a day (BID) | INTRAVENOUS | Status: DC
Start: 2023-07-05 — End: 2023-07-05

## 2023-07-05 MED ORDER — ROSUVASTATIN CALCIUM 5 MG PO TABS
10.0000 mg | ORAL_TABLET | Freq: Every day | ORAL | Status: DC
  Administered 2023-07-05 – 2023-07-13 (×9): 10 mg via ORAL
  Filled 2023-07-05 (×10): qty 2

## 2023-07-05 MED ORDER — POTASSIUM CHLORIDE CRYS ER 20 MEQ PO TBCR: 40.0000 meq | EXTENDED_RELEASE_TABLET | Freq: Two times a day (BID) | ORAL | Status: DC

## 2023-07-05 MED ORDER — CEFAZOLIN SODIUM-DEXTROSE 1-4 GM/50ML-% IV SOLN
1.0000 g | INTRAVENOUS | Status: DC
  Administered 2023-07-05 – 2023-07-13 (×8): 1 g via INTRAVENOUS
  Filled 2023-07-05 (×8): qty 50

## 2023-07-05 MED ORDER — CEFEPIME HCL 1 G IJ SOLR
1.0000 g | INTRAMUSCULAR | Status: DC
  Filled 2023-07-05: qty 10

## 2023-07-05 MED ORDER — PROCHLORPERAZINE EDISYLATE 10 MG/2ML IJ SOLN
5.0000 mg | INTRAMUSCULAR | Status: DC | PRN
  Administered 2023-07-05 – 2023-07-06 (×3): 5 mg via INTRAVENOUS
  Filled 2023-07-05 (×3): qty 1
  Filled 2023-07-05: qty 2

## 2023-07-05 MED ORDER — HYDROMORPHONE HCL 1 MG/ML IJ SOLN
0.5000 mg | INTRAMUSCULAR | Status: DC | PRN
  Administered 2023-07-05 – 2023-07-11 (×17): 0.5 mg via INTRAVENOUS
  Filled 2023-07-05 (×17): qty 0.5

## 2023-07-05 MED ORDER — INSULIN ASPART 100 UNIT/ML IJ SOLN
0.0000 [IU] | Freq: Three times a day (TID) | INTRAMUSCULAR | Status: DC
  Administered 2023-07-06 – 2023-07-09 (×3): 1 [IU] via SUBCUTANEOUS
  Administered 2023-07-10: 2 [IU] via SUBCUTANEOUS
  Administered 2023-07-11: 1 [IU] via SUBCUTANEOUS

## 2023-07-05 MED ORDER — ORAL CARE MOUTH RINSE: 15.0000 mL | OROMUCOSAL | Status: DC | PRN

## 2023-07-05 MED ORDER — POTASSIUM CHLORIDE 20 MEQ PO PACK: 40.0000 meq | PACK | Freq: Two times a day (BID) | ORAL | Status: DC

## 2023-07-05 MED ORDER — POTASSIUM CHLORIDE 20 MEQ PO PACK
20.0000 meq | PACK | Freq: Once | ORAL | Status: AC
  Administered 2023-07-05: 20 meq via ORAL
  Filled 2023-07-05: qty 1

## 2023-07-05 MED ORDER — GENTAMICIN SULFATE 0.1 % EX CREA: 1.0000 | TOPICAL_CREAM | Freq: Every day | CUTANEOUS | Status: DC

## 2023-07-05 MED ORDER — CALCITRIOL 0.25 MCG PO CAPS
1.0000 ug | ORAL_CAPSULE | Freq: Every day | ORAL | Status: DC
  Administered 2023-07-05 – 2023-07-13 (×9): 1 ug via ORAL
  Filled 2023-07-05: qty 4
  Filled 2023-07-05: qty 2
  Filled 2023-07-05 (×2): qty 4
  Filled 2023-07-05: qty 2
  Filled 2023-07-05 (×4): qty 4

## 2023-07-05 MED ORDER — RENA-VITE PO TABS
1.0000 | ORAL_TABLET | Freq: Every day | ORAL | Status: DC
  Administered 2023-07-05 – 2023-07-13 (×9): 1 via ORAL
  Filled 2023-07-05 (×9): qty 1

## 2023-07-05 MED ORDER — POTASSIUM CHLORIDE 10 MEQ/100ML IV SOLN
10.0000 meq | INTRAVENOUS | Status: AC
  Administered 2023-07-05 (×4): 10 meq via INTRAVENOUS
  Filled 2023-07-05 (×4): qty 100

## 2023-07-05 MED ORDER — VANCOMYCIN VARIABLE DOSE PER UNSTABLE RENAL FUNCTION (PHARMACIST DOSING): Status: DC

## 2023-07-05 MED ORDER — MIDODRINE HCL 5 MG PO TABS
2.5000 mg | ORAL_TABLET | Freq: Two times a day (BID) | ORAL | Status: DC
  Administered 2023-07-05: 2.5 mg via ORAL
  Filled 2023-07-05: qty 1

## 2023-07-05 MED ORDER — SODIUM CHLORIDE 0.9 % IV SOLN
1.0000 g | INTRAVENOUS | Status: DC
  Filled 2023-07-05: qty 10

## 2023-07-05 MED ORDER — FAMOTIDINE 20 MG PO TABS
10.0000 mg | ORAL_TABLET | Freq: Every day | ORAL | Status: DC
  Administered 2023-07-05 – 2023-07-13 (×9): 10 mg via ORAL
  Filled 2023-07-05 (×9): qty 1

## 2023-07-05 MED ORDER — ONDANSETRON HCL 4 MG/2ML IJ SOLN
4.0000 mg | Freq: Once | INTRAMUSCULAR | Status: AC
  Administered 2023-07-05: 4 mg via INTRAVENOUS
  Filled 2023-07-05: qty 2

## 2023-07-05 MED ORDER — SODIUM CHLORIDE 0.9 % IV SOLN
1.0000 g | INTRAVENOUS | Status: DC
  Filled 2023-07-05: qty 1

## 2023-07-05 MED ORDER — LACTATED RINGERS IV BOLUS
1000.0000 mL | Freq: Once | INTRAVENOUS | Status: AC
  Administered 2023-07-05: 1000 mL via INTRAVENOUS

## 2023-07-05 MED ORDER — POTASSIUM CHLORIDE CRYS ER 20 MEQ PO TBCR
40.0000 meq | EXTENDED_RELEASE_TABLET | Freq: Two times a day (BID) | ORAL | Status: DC
  Administered 2023-07-05: 40 meq via ORAL
  Filled 2023-07-05: qty 2

## 2023-07-05 MED ORDER — CHLORHEXIDINE GLUCONATE CLOTH 2 % EX PADS
6.0000 | MEDICATED_PAD | Freq: Every day | CUTANEOUS | Status: DC
  Administered 2023-07-05 – 2023-07-13 (×8): 6 via TOPICAL

## 2023-07-05 NOTE — Progress Notes (Signed)
 Pharmacy Antibiotic Note  Lindsey Murillo is a 28 y.o. female admitted on 07/04/2023 with sepsis. Pt with ESRD on PD. Patient initially started on vancomycin  and cefepime > changed to ceftazidime.  BCID result MSSA  - discussed with ID and CCM Pharmacy has been consulted for cefazolin dosing.  Plan: Cefazoiin 1gm IV q24h   Height: 5\' 8"  (172.7 cm) Weight: 43.4 kg (95 lb 10.9 oz) IBW/kg (Calculated) : 63.9  Temp (24hrs), Avg:98.2 F (36.8 C), Min:97.2 F (36.2 C), Max:98.7 F (37.1 C)  Recent Labs  Lab 07/04/23 1542 07/04/23 1546 07/04/23 2109 07/04/23 2230 07/05/23 0113 07/05/23 0754  WBC 10.0  --   --   --  20.6* 20.5*  CREATININE 10.60*  --   --  9.84* 10.21* 10.37*  LATICACIDVEN  --  2.1* 0.7  --   --   --     Estimated Creatinine Clearance: 5.5 mL/min (A) (by C-G formula based on SCr of 10.37 mg/dL (H)).    No Known Allergies  Antimicrobials this admission: Cefepime 2/24 >2/25 Vancomycin 2/24 >2/25 Ceftazidime 2/25 Cefazolin 2/25>    Leota Sauers Pharm.D. CPP, BCPS Clinical Pharmacist [phone] 07/05/2023 5:41 PM

## 2023-07-05 NOTE — Progress Notes (Signed)
 Pharmacy Antibiotic Note  Lindsey Murillo is a 28 y.o. female admitted on 07/04/2023 with sepsis. Pt with ESRD on PD. Pharmacy has been consulted for cefepime and vancomycin dosing.  Plan: Cefepime 1g q24h Vancomycin dose per levels  F/u renal recs, infectious work up and length of therapy  Height: 5\' 8"  (172.7 cm) Weight: 43.4 kg (95 lb 10.9 oz) IBW/kg (Calculated) : 63.9  Temp (24hrs), Avg:99.9 F (37.7 C), Min:98.7 F (37.1 C), Max:100.7 F (38.2 C)  Recent Labs  Lab 07/04/23 1542 07/04/23 1546 07/04/23 2109 07/04/23 2230 07/05/23 0113  WBC 10.0  --   --   --  20.6*  CREATININE 10.60*  --   --  9.84* 10.21*  LATICACIDVEN  --  2.1* 0.7  --   --     Estimated Creatinine Clearance: 5.6 mL/min (A) (by C-G formula based on SCr of 10.21 mg/dL (H)).    No Known Allergies  Antimicrobials this admission: Cefepime 2/24 > Vancomycin 2/24 >  Thank you for allowing pharmacy to be a part of this patient's care.  Marja Kays 07/05/2023 2:36 AM

## 2023-07-05 NOTE — Progress Notes (Signed)
 [facility] Progress Note  Name: Lindsey Murillo MRN: [id number] DOB: 02/19/1996  Chief Complaint:  Abdominal pain  Subjective:  She presented with peritonitis.  She was routed to the ICU due to requirement for levophed.  They have not yet been able to d/c. Per charting she was not doing her PD correctly.  No urine output is charted over 2/24. PD fluid studies c/w peritonitis.  She didn't get PD last night.  She states that the potassium tablets are too big and asks if we have anything else.  On levo at 1 mcg/min (just was reduced per RN)  Review of systems:  She reports chest discomfort  She reports nausea with vomiting No shortness of breath   ------------- Background on consult:  Lindsey Murillo is a 28 y.o. 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  She has not been feeling well and has had N/V/D for the past 4-5 days as well as a cough.  She denies any cloudy PD fluid and reports that her abdominal pain just started today.  In the ED, temp max 100.7, bp 82/46, HR 110, SpO2 93% on room air.  Labs notable for WBC 10, Hgb 10.5, plt 244, Na 134, K 2.6, Co2 17, BUN 36, Cr 10.6, Ca 7.8, alb 1.7.  Presentation concerning for SBP and we were consulted to manage her PD and send off PD fluid for studies during her hospitalization.  Of note, she was not doing her PD correctly for the past week and was 10 kg above edw.  She was not doing the proper amount of exchanges and was reabsorbing.  She declined coming in for HD session to help with her volume, however she declined and performed CAPD at home.  She now appears hypovolemic due to vomiting and diarrhea which predated her abdominal pain and may have a viral gastroenteritis.  Outpatient PD RN reports pt on 6 exchanges over night with 2.4 fills.  Dwell time 1.5 hours.  EDW is 50 kg. She was just changed  from CAPD to CCPD   Intake/Output Summary (Last 24 hours) at 07/05/2023 0709 Last data filed at 07/05/2023 0600 Gross per 24 hour  Intake 2477.74 ml  Output --  Net 2477.74 ml    Vitals:  Vitals:   07/05/23 0445 07/05/23 0500 07/05/23 0515 07/05/23 0530  BP: (!) 104/56 (!) 101/57 (!) 85/49 (!) 103/59  Pulse: 93 78 88 91  Resp: 15 17 15 13   Temp:      TempSrc:      SpO2: 98% 98% 97% 97%  Weight:      Height:         Physical Exam:  General adult female in bed in no acute distress HEENT normocephalic atraumatic extraocular movements intact sclera anicteric Neck supple trachea midline Lungs clear to auscultation bilaterally normal work of breathing at rest on room air Heart S1S2 no rub Abdomen soft tender to palpation; distended; PD catheter in place  Extremities trace edema lower extremities Psych normal mood and affect Neuro alert and oriented x 3 provides hx follows commands Access: PD catheter as above  Medications reviewed   Labs:     Latest Ref Rng & Units 07/05/2023    1:13 AM 07/04/2023   10:30 PM 07/04/2023    3:42 PM  BMP  Glucose 70 - 99 mg/dL 284  132  133   BUN 6 - 20 mg/dL 36  35  36   Creatinine 0.44 - 1.00 mg/dL 16.10  9.60  45.40   Sodium 135 - 145 mmol/L 135  135  134   Potassium 3.5 - 5.1 mmol/L 2.9  2.3  2.6   Chloride 98 - 111 mmol/L 106  103  105   CO2 22 - 32 mmol/L 19  20  17    Calcium 8.9 - 10.3 mg/dL 7.3  7.1  7.8      Assessment/Plan:   PD-associated Bacterial peritonitis -  Follow-up initial culture  Repeat PD fluid cell count and culture today Pharmacy is consulted to dose vanc  On cefepime.  Ok with me or can transition to [employer]. Appreciate pharmacy ESRD -   Will plan on CCPD tonight using all 1.5% bags given hypotension.   Ordered strict ins/outs  Type 1 DM - per primary team  Hypertension/volume  - despite trace pretibial edema and being above edw, she appears volume depleted intravascularly and was hypotensive on arrival.  Gentle PD as above  Anemia  - Hgb acceptable; no acute indication for ESA or PRBC's  Metabolic bone disease -   continue with home meds when she is able to tolerate po  Hypokalemia - likely from GI losses.  Repeat K later today to guide.  See repletion is ordered - adjusted to 40 meq BID x 2 doses using 10 meq tablets.  Per outpatient PD RN she had been on 40 meq BID at home.  Note would have concern for compliance..   Disposition - continue inpatient monitoring   Estanislado Emms, MD 07/05/2023 7:44 AM

## 2023-07-05 NOTE — Progress Notes (Addendum)
 eLink Physician-Brief Progress Note Patient Name: Lindsey Murillo DOB: 1995/11/18 MRN: [id number]   Date of Service  07/05/2023  HPI/Events of Note  27/F with DM, hypertension, autoimmune thyroiditis, ESRD on peritoneal dialysis presenting to the ED with nausea, vomiting and diarrhea over the past 4-5 days and started with abdominal pain earlier in the day. Pt was febrile in the ED, hypotensive, concerning for SBP.    BP 118/62, HR 98, RR 20, O2 sats 98%  Ascitic fluid with 1,900 nucleated cells  eICU Interventions  Continue with Vancomycin and Cefepime pending cultures.  Peritoneal dialysis as per Renal tonight.  Replete K.  Pain control - dilaudid IV PRN ordered. Continue to titrate levophed to keep MAP >65. Heparin for DVT prophylaxis.      Intervention Category Evaluation Type: New Patient Evaluation  Larinda Buttery 07/05/2023, 1:18 AM

## 2023-07-05 NOTE — TOC Initial Note (Signed)
 Transition of Care [facility]) - Initial/Assessment Note    Patient Details  Name: Lindsey Murillo MRN: [id number] Date of Birth: 01-28-1996  Transition of Care [facility]) CM/SW Contact:    Lawerance Sabal, RN Phone Number: 07/05/2023, 2:36 PM  Clinical Narrative:                  Patient admitted with septic shock from bacterial peritonitis from Peritoneal Dialysis. Per nurse still continuing to manage shock, instability.  TOC will continue to follow.  Expected Discharge Plan:  (TBD) Barriers to Discharge: Continued Medical Work up   Patient Goals and CMS Choice            Expected Discharge Plan and Services                                              Prior Living Arrangements/Services                       Activities of Daily Living      Permission Sought/Granted                  Emotional Assessment              Admission diagnosis:  Nausea vomiting and diarrhea [R11.2, R19.7] Peritonitis associated with peritoneal dialysis, initial encounter ([name]) [T85.71XA] Septic shock ([name]) [A41.9, R65.21] Sepsis, due to unspecified organism, unspecified whether acute organ dysfunction present [facility]) [A41.9] Patient Active Problem List   Diagnosis Date Noted   Septic shock ([name]) 07/04/2023   Insulin long-term use ([name]) 12/23/2022   Amenorrhea, secondary 10/26/2022   Vitamin D deficiency 08/26/2022   Hyperlipidemia 07/21/2022   Non-adherence to medical treatment 01/21/2022   ESRD (end stage renal disease) ([name]) 12/18/2021   Secondary hyperparathyroidism of renal origin ([name]) 10/21/2021   Physical deconditioning 10/07/2021   Depression 09/25/2021   Anemia in ESRD (end-stage renal disease) ([name]) 09/25/2021   Nephrotic syndrome 05/27/2021   Chronic pancreatitis ([name]) 08/22/2019   Pancreatic pseudocyst/cyst 08/22/2019   GERD (gastroesophageal reflux disease) 08/22/2019   Uncontrolled type 1 diabetes mellitus with hyperglycemia ([name]) 05/12/2015    Essential hypertension, benign 11/30/2012   Asthma    PCP:  [name], DO Pharmacy:   [facility] [address] [place] - [address] [place] [address] Phone: [phone] Fax: [phone]  [facility] - [place], [place] - [address] [place] [address] Phone: [phone] Fax: [phone]     Social Drivers of Health (SDOH) Social History: SDOH Screenings   Food Insecurity: Patient Declined (07/05/2023)  Housing: Patient Declined (07/05/2023)  Transportation Needs: Patient Declined (07/05/2023)  Utilities: Patient Declined (07/05/2023)  Alcohol Screen: Low Risk  (01/14/2023)  Depression (PHQ2-9): Low Risk  (01/14/2023)  Recent Concern: Depression (PHQ2-9) - Medium Risk (10/26/2022)  Financial Resource Strain: Low Risk  (04/28/2023)   Received from [facility]  Physical Activity: Insufficiently Active (01/14/2023)  Social Connections: Moderately Isolated (01/14/2023)  Stress: No Stress Concern Present (05/24/2023)   Received from [facility]  Tobacco Use: Low Risk  (07/04/2023)  Health Literacy: Adequate Health Literacy (01/14/2023)   SDOH Interventions:     Readmission Risk Interventions     No data to display

## 2023-07-05 NOTE — Progress Notes (Addendum)
 NAME:  Lindsey Murillo, MRN:  [id number], DOB:  1995/07/19, LOS: 1 ADMISSION DATE:  07/04/2023, CONSULTATION DATE:  07/05/2023 REFERRING MD:  Karene Fry- EDP, CHIEF COMPLAINT:  Abdominal pain    History of Present Illness:  28yo female pt with T1DM, HTN, autoimmune thyroiditis and ERSD - on PD , prior on HD.  Patient admitted with 8/10 abdominal pain for 4 days, associated with worsening cough and n/v/d thus patient sought medical attention. In the ER patient had peritoneal cultures drawn, concerning for viral gastroenteritis and peritonitis.  Nephrologist saw the patient in the ER. Patient will be admitted to the ICU on norepi, septic shock.  Initial Labs: Na 134, K 2.6, Bicarb 17, BUN 36, Cr 10.60, Albumin 1.7. WBC 10.0, Hgb 10.5. PT 17.2 INR 1.4, Lactic 2.1.   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   Significant Hospital Events: Including procedures, antibiotic start and stop dates in addition to other pertinent events   Admitted to PCCM on Levophed   Interim History / Subjective:  Appears in pain, reports abdominal pain, nausea and vomiting.  Reports symptoms of Abdominal pain, nausea, vomiting and diarrhea for the past week.  Reports that she has gain 10 kg from last week.   Objective   Blood pressure (!) 103/59, pulse 91, temperature 98.6 F (37 C), temperature source Oral, resp. rate 13, height 5\' 8"  (1.727 m), weight 43.4 kg, SpO2 97%.        Intake/Output  Summary (Last 24 hours) at 07/05/2023 0725 Last data filed at 07/05/2023 0600 Gross per 24 hour  Intake 2477.74 ml  Output --  Net 2477.74 ml   Filed Weights   07/04/23 1917 07/05/23 0032  Weight: 56.7 kg 43.4 kg    Examination: General: Appears in pain, no acute distress  HENT: AT/ [place].  Lungs: CTAB anteriorly  Cardiovascular: RR. No murmur  Abdomen: +Firm, mildly distended, tender to touch. Catheter site clean and dressed.   Extremities: SCDs.   Neuro: Alert and oriented x 3.  GU: Not assessed.   Resolved Hospital Problem list   N/A   Assessment & Plan:  Septic Shock 2/2 Peritonitis ESRD on PD  Hypokalemia Leukocytosis Initial Peritoneal fluid analysis showed Total nucleated cell count 1,900; Neutrophil count 93. Nephrology on board, plan for repeat peritoneal fluid analysis today.  - Peritoneal culture pending, prelim result showed abundant WBC  - Blood cultures NG so far - IV Vanc and Fortez  - Replete K and recheck PM  - Levophed MAP > 65  - Repeat PD fluid cell count and culture today - IV dilaudid 0.5 mg q2h PRN  - PRN IV Zofran 4 mg q6h and Compazine 5mg  q4h for nausea and vomiting   Anemia, Normocytic Hgb 10.3 today, baseline ~10-12. Monitor   T1DM - Glycemic control - SSI   HLD - Continue home medication Crestor   Best Practice (right click and "Reselect all SmartList Selections" daily)   Diet/type: Renal  DVT prophylaxis prophylactic heparin  Pressure ulcer(s): Assessed by RN  GI prophylaxis: H2B Lines: Peripherals  Foley:  N/A Code Status:  full code Last date of multidisciplinary goals of  care discussion [Mother at bedside ]  Labs   CBC: Recent Labs  Lab 07/04/23 1542 07/05/23 0113  WBC 10.0 20.6*  NEUTROABS 9.0*  --   HGB 10.5* 10.6*  HCT 32.1* 31.9*  MCV 89.7 88.6  PLT 244 256    Basic Metabolic Panel: Recent Labs  Lab 07/04/23 1542 07/04/23 2107 07/04/23 2230 07/05/23 0113 07/05/23 0124  NA 134*  --  135 135  --   K 2.6*   --  2.3* 2.9*  --   CL 105  --  103 106  --   CO2 17*  --  20* 19*  --   GLUCOSE 133*  --  132* 107*  --   BUN 36*  --  35* 36*  --   CREATININE 10.60*  --  9.84* 10.21*  --   CALCIUM 7.8*  --  7.1* 7.3*  --   MG  --  1.4* 1.8  --  2.0  PHOS  --   --   --  4.2 4.2   GFR: Estimated Creatinine Clearance: 5.6 mL/min (A) (by C-G formula based on SCr of 10.21 mg/dL (H)). Recent Labs  Lab 07/04/23 1542 07/04/23 1546 07/04/23 2109 07/05/23 0113  WBC 10.0  --   --  20.6*  LATICACIDVEN  --  2.1* 0.7  --     Liver Function Tests: Recent Labs  Lab 07/04/23 1542 07/05/23 0113  AST 10*  --   ALT 10  --   ALKPHOS 84  --   BILITOT 0.6  --   PROT 5.4*  --   ALBUMIN 1.7* <1.5*   No results for input(s): "LIPASE", "AMYLASE" in the last 168 hours. No results for input(s): "AMMONIA" in the last 168 hours.  ABG    Component Value Date/Time   HCO3 30.6 (H) 12/01/2022 1047   TCO2 24 01/12/2022 0721   ACIDBASEDEF 8.9 (H) 06/13/2021 0003   O2SAT 57.5 12/01/2022 1047     Coagulation Profile: Recent Labs  Lab 07/04/23 1542  INR 1.4*    Cardiac Enzymes: No results for input(s): "CKTOTAL", "CKMB", "CKMBINDEX", "TROPONINI" in the last 168 hours.  HbA1C: Hemoglobin A1C  Date/Time Value Ref Range Status  06/23/2023 03:48 PM 7.2 (A) 4.0 - 5.6 % Final  12/13/2018 12:00 AM >15.5  Final   HbA1c, POC (controlled diabetic range)  Date/Time Value Ref Range Status  01/21/2022 11:15 AM 8.3 (A) 0.0 - 7.0 % Final   HbA1c POC (<> result, manual entry)  Date/Time Value Ref Range Status  06/09/2018 09:28 AM >13 4.0 - 5.6 % Final   Hgb A1c MFr Bld  Date/Time Value Ref Range Status  12/14/2022 03:46 PM 10.5 (H) 4.8 - 5.6 % Final    Comment:             Prediabetes: 5.7 - 6.4          Diabetes: >6.4          Glycemic control for adults with diabetes: <7.0   07/20/2022 02:40 PM 10.5 (H) 4.8 - 5.6 % Final    Comment:             Prediabetes: 5.7 - 6.4          Diabetes: >6.4           Glycemic control for adults with diabetes: <7.0     CBG: Recent Labs  Lab 07/04/23 1937 07/05/23 0330  GLUCAP 125* 94    Review of Systems:   Abdominal pain,  nausea and vomiting   Past Medical History:  She,  has a past medical history of Acanthosis nigricans, acquired, 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 chronic kidney disease ([name]) (09/25/2021), Asthma, 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   Surgical History:   Past Surgical History:  Procedure Laterality Date   AV FISTULA PLACEMENT Right 12/08/2021   Procedure: ARTERIOVENOUS  FISTULA CREATION VERSUS GRAFT;  Surgeon: Larina Earthly, MD;  Location: [facility];  Service: Vascular;  Laterality: Right;   BASCILIC VEIN TRANSPOSITION Right 01/12/2022   Procedure: RIGHT ARM SECOND STAGE BASILIC VEIN TRANSPOSITION;  Surgeon: Larina Earthly, MD;  Location: [facility];  Service: Vascular;  Laterality: Right;   BIOPSY  08/27/2016   Procedure: BIOPSY;  Surgeon: West Bali, MD;  Location: [facility];  Service: Endoscopy;;  duodenum; gastric   BIOPSY  06/27/2019   Procedure: BIOPSY;  Surgeon: Corbin Ade, MD;  Location: [facility];  Service: Endoscopy;;   BIOPSY  02/09/2022   Procedure: BIOPSY;  Surgeon: Lanelle Bal, DO;  Location: [facility];  Service: Endoscopy;;   BREAST CYST EXCISION Right 11/12/2021   Procedure: RIGHT BREAST ABSCESS INCISION AND DRAINAGE;  Surgeon: Emelia Loron, MD;  [facility];  Service: General;  Laterality: Right;  LMA   COLONOSCOPY     COLONOSCOPY WITH PROPOFOL N/A 02/09/2022   Procedure: COLONOSCOPY WITH PROPOFOL;  Surgeon:  Lanelle Bal, DO;  Location: [facility];  Service: Endoscopy;  Laterality: N/A;  7:30am, asa 3, dialysis pt   ESOPHAGOGASTRODUODENOSCOPY N/A 08/27/2016   Dr. Darrick Penna: mild gastritis. Negative celiac. No obvious source for dyspepsia/diarrhea   ESOPHAGOGASTRODUODENOSCOPY (EGD) WITH PROPOFOL N/A 06/27/2019   rourk: Focal abnormality of the gastric mucosa likely due to trauma (heaving).  Biopsy showed mild gastritis, negative for H. pylori.  Esophageal dilation for history of dysphagia but normal-appearing esophagus.   ESOPHAGOGASTRODUODENOSCOPY (EGD) WITH PROPOFOL N/A 02/09/2022   Procedure: ESOPHAGOGASTRODUODENOSCOPY (EGD) WITH PROPOFOL;  Surgeon: Lanelle Bal, DO;  Location[facility];  Service: Endoscopy;  Laterality: N/A;   IR FLUORO GUIDE CV LINE RIGHT  10/13/2021   IR FLUORO GUIDE CV LINE RIGHT  10/19/2021   IR US GUIDE VASC ACCESS RIGHT  10/13/2021   IRRIGATION AND DEBRIDEMENT ABSCESS N/A 10/04/2021   Procedure: IRRIGATION AND DEBRIDEMENT NECK ABSCESS;  Surgeon: Axel Filler, MD;  Location: [facility] OR;  Service: General;  Laterality: N/A;     Social History:   reports that she has never smoked. She has never been exposed to tobacco smoke. She has never used smokeless tobacco. She reports that she does not drink alcohol and does not use drugs.   Family History:  Her family history includes Alcohol abuse in her paternal grandfather; Cancer in her father; Diabetes in her mother; Huntington's disease in her maternal grandfather. There is no history of Colon cancer or Colon polyps.   Allergies No Known Allergies   Home Medications  Prior to Admission medications   Medication Sig Start Date End Date Taking? Authorizing Provider  B Complex-C-Folic Acid (DIALYVITE 800) 0.8 MG TABS Take 1 tablet by mouth daily. 11/16/21  Yes [provider]  calcitRIOL (ROCALTROL) 0.5 MCG capsule Take 1 mcg by mouth daily.   Yes [provider]  cinacalcet (SENSIPAR) 30 MG tablet Take 30  mg by mouth. 06/10/23  Yes [provider]  gentamicin cream (GARAMYCIN) 0.1 % Apply 1 Application topically daily.   Yes [provider]  Insulin Aspart, w/Niacinamide, (FIASP) 100 UNIT/ML SOLN Inject 5-8 Units as directed with breakfast, with lunch, and with evening meal. Sliding Scale   Yes [provider]  medroxyPROGESTERone (PROVERA) 10 MG tablet Take provera 10 mg for 10 days every 3 months 11/16/22  Yes Cyril Mourning A, NP  ondansetron (ZOFRAN-ODT) 4 MG disintegrating tablet Take 1 tablet (4 mg total) by mouth every 8 (eight) hours as needed for nausea or vomiting. 06/08/23  Yes Pollyann Savoy, MD  pantoprazole (PROTONIX) 40 MG tablet Take 1 tablet (40 mg total) by mouth 2 (two) times daily before a meal. 03/22/23 03/16/24 Yes Mahon, Courtney L, NP  potassium chloride SA (KLOR-CON M) 20 MEQ tablet Take 20 mEq by mouth 2 (two) times daily. 06/23/23  Yes [provider]  rosuvastatin (CRESTOR) 10 MG tablet Take 1 tablet (10 mg total) by mouth daily. 03/15/23  Yes Cook, Jayce G, DO  scopolamine (TRANSDERM-SCOP) 1 MG/3DAYS Place 1 patch (1.5 mg total) onto the skin every 3 (three) days. 02/02/23  Yes Roemhildt, Lorin T, PA-C  sucroferric oxyhydroxide (VELPHORO) 500 MG chewable tablet Chew 1,000 mg by mouth 3 (three) times daily with meals. 11/16/21  Yes [provider]  TRESIBA FLEXTOUCH 100 UNIT/ML FlexTouch Pen Inject 15 Units into the skin at bedtime. 06/23/23  Yes Reardon, Freddi Starr, NP  VENTOLIN HFA 108 (90 Base) MCG/ACT inhaler Inhale 2 puffs into the lungs every 4 (four) hours as needed for wheezing or shortness of breath. 2 puffs once every 4 hours as needed for cough, wheeze, shortness of breath and chest tightness. May use 2 puffs 5 to 15 minutes before activity to decrease cough or wheeze. 03/24/23  Yes Padgett, Pilar Grammes, MD  Vitamin D, Ergocalciferol, (DRISDOL) 1.25 MG (50000 UNIT) CAPS capsule Take 50,000 Units by mouth every 7 (seven)  days.   Yes [provider]  acetaminophen (TYLENOL) 325 MG tablet Take 325 mg by mouth every 6 (six) hours as needed for mild pain (pain score 1-3) or moderate pain (pain score 4-6). Patient not taking: Reported on 07/04/2023 12/24/21   [provider]  cetirizine HCl (ZYRTEC) 5 MG/5ML SOLN TAKE 1.25 ML TWICE A DAY AS NEEDED FOR A RUNNY NOSE OR ITCH Patient not taking: Reported on 07/04/2023 05/09/23   Marcelyn Bruins, MD  Continuous Glucose Receiver (DEXCOM G7 RECEIVER) DEVI by Does not apply route.    [provider]  Continuous Glucose Sensor (DEXCOM G7 SENSOR) MISC by Does not apply route.    [provider]  fluticasone (FLONASE) 50 MCG/ACT nasal spray Place 2 sprays into both nostrils daily as needed for allergies or rhinitis. Patient not taking: Reported on 07/04/2023 12/23/22   Hetty Blend, FNP  SSD 1 % cream APPLY CREAM TO WOUND DAILY AND COVER WITH GAUZE DRESSING 04/01/23   McCaughan, Dia D, DPM     Critical care time:

## 2023-07-06 ENCOUNTER — Inpatient Hospital Stay (HOSPITAL_COMMUNITY): Payer: 59

## 2023-07-06 DIAGNOSIS — N186 End stage renal disease: Secondary | ICD-10-CM | POA: Diagnosis not present

## 2023-07-06 DIAGNOSIS — B9561 Methicillin susceptible Staphylococcus aureus infection as the cause of diseases classified elsewhere: Secondary | ICD-10-CM | POA: Diagnosis not present

## 2023-07-06 DIAGNOSIS — R6521 Severe sepsis with septic shock: Secondary | ICD-10-CM | POA: Diagnosis not present

## 2023-07-06 DIAGNOSIS — R7881 Bacteremia: Secondary | ICD-10-CM | POA: Diagnosis not present

## 2023-07-06 DIAGNOSIS — K659 Peritonitis, unspecified: Secondary | ICD-10-CM | POA: Diagnosis not present

## 2023-07-06 DIAGNOSIS — T8571XA Infection and inflammatory reaction due to peritoneal dialysis catheter, initial encounter: Secondary | ICD-10-CM | POA: Diagnosis not present

## 2023-07-06 DIAGNOSIS — A09 Infectious gastroenteritis and colitis, unspecified: Secondary | ICD-10-CM

## 2023-07-06 DIAGNOSIS — H33002 Unspecified retinal detachment with retinal break, left eye: Secondary | ICD-10-CM

## 2023-07-06 DIAGNOSIS — A419 Sepsis, unspecified organism: Secondary | ICD-10-CM | POA: Diagnosis not present

## 2023-07-06 LAB — ECHOCARDIOGRAM COMPLETE
AR max vel: 2.02 cm2
AV Area VTI: 2.22 cm2
AV Area mean vel: 1.92 cm2
AV Mean grad: 3 mm[Hg]
AV Peak grad: 5.3 mm[Hg]
Ao pk vel: 1.15 m/s
Area-P 1/2: 5.12 cm2
Height: 68 in
MV VTI: 1.65 cm2
S' Lateral: 2.9 cm
Weight: 1530.87 [oz_av]

## 2023-07-06 LAB — BASIC METABOLIC PANEL
Anion gap: 14 (ref 5–15)
BUN: 35 mg/dL — ABNORMAL HIGH (ref 6–20)
CO2: 18 mmol/L — ABNORMAL LOW (ref 22–32)
Calcium: 7.5 mg/dL — ABNORMAL LOW (ref 8.9–10.3)
Chloride: 100 mmol/L (ref 98–111)
Creatinine, Ser: 8.88 mg/dL — ABNORMAL HIGH (ref 0.44–1.00)
GFR, Estimated: 6 mL/min — ABNORMAL LOW (ref >60)
Glucose, Bld: 208 mg/dL — ABNORMAL HIGH (ref 70–99)
Potassium: 3.5 mmol/L (ref 3.5–5.1)
Sodium: 132 mmol/L — ABNORMAL LOW (ref 135–145)

## 2023-07-06 LAB — BODY FLUID CULTURE W GRAM STAIN

## 2023-07-06 LAB — RENAL FUNCTION PANEL
Albumin: <1.5 g/dL — ABNORMAL LOW (ref 3.5–5.0)
Anion gap: 13 (ref 5–15)
BUN: 34 mg/dL — ABNORMAL HIGH (ref 6–20)
CO2: 20 mmol/L — ABNORMAL LOW (ref 22–32)
Calcium: 7.5 mg/dL — ABNORMAL LOW (ref 8.9–10.3)
Chloride: 101 mmol/L (ref 98–111)
Creatinine, Ser: 8.86 mg/dL — ABNORMAL HIGH (ref 0.44–1.00)
GFR, Estimated: 6 mL/min — ABNORMAL LOW (ref >60)
Glucose, Bld: 209 mg/dL — ABNORMAL HIGH (ref 70–99)
Phosphorus: 3.6 mg/dL (ref 2.5–4.6)
Potassium: 3.6 mmol/L (ref 3.5–5.1)
Sodium: 134 mmol/L — ABNORMAL LOW (ref 135–145)

## 2023-07-06 LAB — GLUCOSE, CAPILLARY
Glucose-Capillary: 153 mg/dL — ABNORMAL HIGH (ref 70–99)
Glucose-Capillary: 151 mg/dL — ABNORMAL HIGH (ref 70–99)
Glucose-Capillary: 42 mg/dL — CL (ref 70–99)
Glucose-Capillary: 74 mg/dL (ref 70–99)
Glucose-Capillary: 81 mg/dL (ref 70–99)

## 2023-07-06 LAB — CBC
HCT: 29.6 % — ABNORMAL LOW (ref 36.0–46.0)
Hemoglobin: 9.9 g/dL — ABNORMAL LOW (ref 12.0–15.0)
MCH: 29.5 pg (ref 26.0–34.0)
MCHC: 33.4 g/dL (ref 30.0–36.0)
MCV: 88.1 fL (ref 80.0–100.0)
Platelets: 294 10*3/uL (ref 150–400)
RBC: 3.36 MIL/uL — ABNORMAL LOW (ref 3.87–5.11)
RDW: 19.2 % — ABNORMAL HIGH (ref 11.5–15.5)
WBC: 15.8 10*3/uL — ABNORMAL HIGH (ref 4.0–10.5)
nRBC: 0 % (ref 0.0–0.2)

## 2023-07-06 LAB — GLUCOSE, BODY FLUID OTHER: Glucose, Body Fluid Other: 2073 mg/dL

## 2023-07-06 LAB — MAGNESIUM: Magnesium: 1.9 mg/dL (ref 1.7–2.4)

## 2023-07-06 LAB — PATHOLOGIST SMEAR REVIEW

## 2023-07-06 MED ORDER — POTASSIUM CHLORIDE 20 MEQ PO PACK
20.0000 meq | PACK | Freq: Two times a day (BID) | ORAL | Status: DC
  Administered 2023-07-06 – 2023-07-09 (×7): 20 meq via ORAL
  Filled 2023-07-06 (×7): qty 1

## 2023-07-06 MED ORDER — DEXTROSE 50 % IV SOLN
INTRAVENOUS | Status: AC
  Administered 2023-07-06: 25 g via INTRAVENOUS
  Filled 2023-07-06: qty 50

## 2023-07-06 MED ORDER — MIDODRINE HCL 5 MG PO TABS
5.0000 mg | ORAL_TABLET | Freq: Three times a day (TID) | ORAL | Status: DC
  Administered 2023-07-06 – 2023-07-13 (×20): 5 mg via ORAL
  Filled 2023-07-06 (×20): qty 1

## 2023-07-06 MED ORDER — INSULIN GLARGINE 100 UNIT/ML ~~LOC~~ SOLN
5.0000 [IU] | Freq: Every day | SUBCUTANEOUS | Status: DC
  Administered 2023-07-07: 5 [IU] via SUBCUTANEOUS
  Filled 2023-07-06 (×3): qty 0.05

## 2023-07-06 MED ORDER — ACETAMINOPHEN 325 MG PO TABS
650.0000 mg | ORAL_TABLET | Freq: Four times a day (QID) | ORAL | Status: DC | PRN
  Administered 2023-07-08 – 2023-07-12 (×6): 650 mg via ORAL
  Filled 2023-07-06 (×6): qty 2

## 2023-07-06 MED ORDER — DEXTROSE 50 % IV SOLN: 25.0000 g | INTRAVENOUS | Status: AC

## 2023-07-06 MED ORDER — ACETAMINOPHEN 325 MG PO TABS
ORAL_TABLET | ORAL | Status: AC
  Administered 2023-07-06: 650 mg via ORAL
  Filled 2023-07-06: qty 2

## 2023-07-06 MED ORDER — INSULIN GLARGINE 100 UNIT/ML ~~LOC~~ SOLN
10.0000 [IU] | Freq: Every day | SUBCUTANEOUS | Status: DC
  Filled 2023-07-06: qty 0.1

## 2023-07-06 MED ORDER — GENTAMICIN SULFATE 0.1 % EX CREA
1.0000 | TOPICAL_CREAM | Freq: Every day | CUTANEOUS | Status: DC
  Administered 2023-07-06 – 2023-07-11 (×6): 1 via TOPICAL
  Filled 2023-07-06 (×2): qty 15

## 2023-07-06 MED ORDER — DELFLEX-LC/1.5% DEXTROSE 344 MOSM/L IP SOLN: INTRAPERITONEAL | Status: DC

## 2023-07-06 NOTE — Progress Notes (Signed)
 [facility] Progress Note  Name: Lindsey Murillo MRN: [id number] DOB: 09/06/1995  Chief Complaint:  Abdominal pain  Subjective:  No urine output is charted for 2/25.  She got PD overnight from 2/25-2/26.  UF not yet available.  Antibiotics were changed - it appears vanc is no longer ordered and she is ordered cefazolin.  Note from pharmacy is on chart.  Usually uses 1.5% dextrose at home.  She has been on levo at 2 mcg/min. Per nurse she dropped with a PRN.  Midodrine has been on at 2.5 mg BID.  Review of systems:   She reports chest discomfort has resolved No more n/v No shortness of breath  Does make urine at home but not here  ------------- Background on consult:  Lindsey Murillo is a 28 y.o. female with a PMH significant for type 1 DM,  HTN, autoimmune thyroiditis, and ESRD on CCPD who presented to [facility] ED today after developing abdominal pain this morning.  She has not been feeling well and has had N/V/D for the past 4-5 days as well as a cough.  She denies any cloudy PD fluid and reports that her abdominal pain just started today.  In the ED, temp max 100.7, bp 82/46, HR 110, SpO2 93% on room air.  Labs notable for WBC 10, Hgb 10.5, plt 244, Na 134, K 2.6, Co2 17, BUN 36, Cr 10.6, Ca 7.8, alb 1.7.  Presentation concerning for SBP and we were consulted to manage her PD and send off PD fluid for studies during her hospitalization.  Of note, she was not doing her PD correctly for the past week and was 10 kg above edw.  She was not doing the proper amount of exchanges and was reabsorbing.  She declined coming in for HD session to help with her volume, however she declined and performed CAPD at home.  She now appears hypovolemic due to vomiting and diarrhea which predated her abdominal pain and may have a viral gastroenteritis.  Outpatient PD RN reports pt on 6 exchanges over night with 2.4 fills.  Dwell time 1.5 hours.  EDW is 50 kg. She was just changed from CAPD to  CCPD   Intake/Output Summary (Last 24 hours) at 07/06/2023 0630 Last data filed at 07/06/2023 0500 Gross per 24 hour  Intake 1607.62 ml  Output --  Net 1607.62 ml    Vitals:  Vitals:   07/06/23 0000 07/06/23 0015 07/06/23 0030 07/06/23 0353  BP: 104/76 99/66 97/63    Pulse: (!) 102 90 88   Resp: (!) 21 14 13    Temp: 97.9 F (36.6 C)   98 F (36.7 C)  TempSrc: Oral   Oral  SpO2: 99% 99% 99%   Weight:      Height:         Physical Exam:  General adult female in bed in no acute distress HEENT normocephalic atraumatic extraocular movements intact sclera anicteric Neck supple trachea midline Lungs clear to auscultation bilaterally normal work of breathing at rest on room air Heart S1S2 no rub Abdomen soft nontender to palpation; distended; PD catheter in place  Extremities no edema lower extremities Psych normal mood and affect Neuro alert and oriented x 3 provides hx follows commands Access: PD catheter as above  Medications reviewed   Labs:     Latest Ref Rng & Units 07/06/2023    3:25 AM 07/05/2023   12:48 PM 07/05/2023    7:54 AM  BMP  Glucose 70 - 99  mg/dL 70 - 99 mg/dL 161    096   98   BUN 6 - 20 mg/dL 6 - 20 mg/dL 34    35   38   Creatinine 0.44 - 1.00 mg/dL 0.45 - 4.09 mg/dL 8.11    9.14   78.29   Sodium 135 - 145 mmol/L 135 - 145 mmol/L 134    132   134   Potassium 3.5 - 5.1 mmol/L 3.5 - 5.1 mmol/L 3.6    3.5  3.5  2.5   Chloride 98 - 111 mmol/L 98 - 111 mmol/L 101    100   105   CO2 22 - 32 mmol/L 22 - 32 mmol/L 20    18   18    Calcium 8.9 - 10.3 mg/dL 8.9 - 56.2 mg/dL 7.5    7.5   7.2      Assessment/Plan:   PD-associated Bacterial peritonitis -  Moderate staph aureus on PD fluid culture Repeat PD fluid cell count today - not yet declining  On cefazolin - per primary team who stated that they had consulted ID.  No note on chart - do see pharmacy note.   Will reach out to ID re: catheter ESRD -   Will plan on CCPD tonight using all  1.5% bags given hypotension.     Ordered strict ins/outs  Staph aureus bacteremia - on cefazolin  Type 1 DM - per primary team  Hypertension/volume  - despite trace pretibial edema and being above edw, she appeared volume depleted intravascularly and was hypotensive on arrival. Gentle PD as above. Noted she was started on a low dose of midodrine. I increased midodrine to 5 mg BID   Anemia  - Hgb acceptable; no acute indication for  PRBC's.  Reassess ESA tomorrow.  Unsure of last outpatient ESA dose.  Metabolic bone disease -  phos controlled. continue with home meds when she is able to tolerate po  Hypokalemia - likely from GI losses.  Start potassium 20 meq BID   Disposition - continue inpatient monitoring   Estanislado Emms, MD 07/06/2023 6:56 AM

## 2023-07-06 NOTE — Consult Note (Signed)
 [facility]    Date of Admission:  07/04/2023     Total days of antibiotics 2   Cefazolin  Vanc + Ceftaz x 1 day               Reason for Consult: MSSA Bacteremia     Referring Provider: [employer]  Primary Care Provider: [name], DO    Assessment: Lindsey Murillo is a 28 y.o. female admitted from home with:   Septic Shock -  MSSA Bacteremia -  2/2 Bacterial Peritonitis with Infected PD catheter - Admitted with leukocytosis and fever in the setting of abdominal pain. Blood cultures reveal 1/2 sets growing out MSSA. Her peritoneal fluid last night showed 21,250 nucleated cells 85% neuts. C/W infected PD catheter. Still requiring vasopressors low dose in ICU. Staph growing from PD cath effluent also. -Repeat blood cultures tomorrow  -Continue cefazolin for targeted therapy   -TTE if not already done -continue to follow for other sites of infection  -would advocate for removing the PD catheter    ESRD -  Would recommend removal of PD catheter given she had secondary bacteremia and aggressive organism; would not recommend treating through given she is so symptomatic from it. Effluent described to be fibrinous and bloody.    Infectious Enterocolitis -  Acute vomiting/diarrhea at home. May be due to bacteremia not a separate GI process.  CT scan with some thickening. 3 stools o/n and 1 today thusfar    T1DM - Per primary team    Chronic DFU -  Follows with podiatry. Visit on 2/17 recently and new onset swelling in the legs and wound described to be closed w/o any cellulitis. Unlikely contributing if this is now closed. Suspect swelling more r/t improper exchange with PD at home.    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 Eye appears a little more injected on the left than the right. Concern that she may have some seeding here in the setting of bacteremia and don't want to miss endophthalmitis given she has  baseline pain / issues with it.  -Would be reasonable to have ophthalmology come see her to ensure nothing acutely different in light of her current illness.    The above plan d/w PCCM team.    Plan: Continue cefazolin systemically  Repeat blood cx tomorrow (ordered)   Please have PD cath removed  Please have opthal come see her to ensure no secondary endophthalmitis (will be hard to differentiate clinically given her underlying pathology but exam does appear to be red L>R).     Principal Problem:   Septic shock ([name]) Active Problems:   Dialysis-associated peritonitis ([name])   Pressure injury of skin    calcitRIOL  1 mcg Oral Daily   Chlorhexidine Gluconate Cloth  6 each Topical Daily   cinacalcet  30 mg Oral Q breakfast   famotidine  10 mg Oral Daily   gentamicin cream  1 Application Topical Daily   heparin  5,000 Units Subcutaneous Q8H   insulin aspart  0-6 Units Subcutaneous TID WC   insulin glargine  10 Units Subcutaneous QHS   midodrine  5 mg Oral TID WC   multivitamin  1 tablet Oral Daily   potassium chloride  20 mEq Oral BID   rosuvastatin  10 mg Oral Daily    HPI: Lindsey Murillo is a 28 y.o. female admitted from home with vomiting/diarrhea and cough.  H/O T1DM, HTN, autoimmune thyroiditis, ESRD on PD.   She has RUE AVF that was previously used for hemodialysis. She transitioned to home PD the last few weeks after catheter placed in January 2025. Has always been painful. Has had trouble with LE swelling lately - OP Nephro recommended HD for fluid management, but she declined.  Developed N/V/D 4d prior to admission as well as abdominal pain and distension. Sepsis criteria met in ER. Blood cultures growing MSSA.PD fluid cell count elevated c/w secondary bacterial peritonitis from PD catheter. This was placed with surgery team in January 2025.    Retinal detachment in left eye (not acute - follows with Dr. Tanya Nones outpatient at [facility] and planning on retinal detachment  repair in March from what I can see). She has chronic pain in the left eye for a few months now.  T1DM - wearing dexcom.   She still feels lousey.   Review of Systems: Review of Systems  Constitutional:  Positive for chills, fever and malaise/fatigue.  Eyes:  Positive for pain.  Respiratory:  Positive for cough.   Gastrointestinal:  Positive for abdominal pain, diarrhea, nausea and vomiting.  Musculoskeletal:  Negative for back pain.    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   Diabetic autonomic neuropathy ([name])    Diabetic peripheral neuropathy ([name])    Environmental allergies    Goiter    History of blood transfusion    Hypertension    no meds currently   Hypoglycemia associated with diabetes ([name])    Musculoskeletal pain 04/20/2022   Tachycardia    Thyroiditis, autoimmune    Type 1 diabetes mellitus in patient age 67-19 years with HbA1C goal below 7.5     Social History   Tobacco Use   Smoking status: Never    Passive exposure: Never   Smokeless tobacco: Never  Vaping Use   Vaping status: Never Used  Substance Use Topics   Alcohol use: No   Drug use: No    Family History  Problem Relation Age of Onset   Alcohol abuse Paternal Grandfather    Huntington's disease Maternal Grandfather    Cancer Father        pancreatic   Diabetes Mother        Type II DM   Colon cancer Neg Hx    Colon polyps Neg Hx    No Known Allergies  OBJECTIVE: Blood pressure (!) 100/56, pulse 96, temperature 97.8 F (36.6 C), temperature source Oral, resp. rate (!) 9, height 5\' 8"  (1.727 m), weight 43.4 kg, SpO2 97%.  Physical Exam Constitutional:      Appearance: She is ill-appearing.  Eyes:     Conjunctiva/sclera:     Left  eye: Left conjunctiva is injected.  Cardiovascular:     Rate and Rhythm: Regular rhythm. Tachycardia present.  Pulmonary:     Effort: Pulmonary effort is normal.     Breath sounds: Normal breath sounds.  Abdominal:     General: There is distension.     Tenderness: There is generalized abdominal tenderness.  Skin:    Comments: Scars noted on arm where AVF in place +bruit/thrill  Neurological:     Mental Status: She is alert.     Lab Results Lab Results  Component Value Date   WBC 15.8 (H) 07/06/2023   HGB 9.9 (L) 07/06/2023   HCT 29.6 (L) 07/06/2023   MCV 88.1 07/06/2023   PLT 294 07/06/2023    Lab Results  Component Value Date   CREATININE 8.86 (H) 07/06/2023   CREATININE 8.88 (H) 07/06/2023   BUN 34 (H) 07/06/2023   BUN 35 (H) 07/06/2023   NA 134 (L) 07/06/2023   NA 132 (L) 07/06/2023   K 3.6 07/06/2023   K 3.5 07/06/2023   CL 101 07/06/2023   CL 100 07/06/2023   CO2 20 (L) 07/06/2023   CO2 18 (L) 07/06/2023    Lab Results  Component Value Date   ALT 10 07/04/2023   AST 10 (L) 07/04/2023   ALKPHOS 84 07/04/2023   BILITOT 0.6 07/04/2023     Microbiology: Recent Results (from the past 240 hours)  Culture, blood (Routine x 2)     Status: Abnormal (Preliminary result)   Collection Time: 07/04/23  3:42 PM   Specimen: BLOOD  Result Value Ref Range Status   Specimen Description BLOOD SITE NOT SPECIFIED  Final   Special Requests   Final    BOTTLES DRAWN AEROBIC AND ANAEROBIC Blood Culture results may not be optimal due to an inadequate volume of blood received in culture bottles   Culture  Setup Time   Final    GRAM POSITIVE COCCI IN CLUSTERS AEROBIC BOTTLE ONLY CRITICAL RESULT CALLED TO, READ BACK BY AND VERIFIED WITH: PHARMD A. MEYER [id number] @ 1604 FH    Culture (A)  Final    STAPHYLOCOCCUS AUREUS SUSCEPTIBILITIES TO FOLLOW Performed at [facility], [address], [place], [place] [address]    Report Status PENDING  Incomplete  Blood Culture ID  Panel (Reflexed)     Status: Abnormal   Collection Time: 07/04/23  3:42 PM  Result Value Ref Range Status   Enterococcus faecalis NOT DETECTED NOT DETECTED Final   Enterococcus Faecium NOT DETECTED NOT DETECTED Final   Listeria monocytogenes NOT DETECTED NOT DETECTED Final   Staphylococcus species DETECTED (A) NOT DETECTED Final    Comment: CRITICAL RESULT CALLED TO, READ BACK BY AND VERIFIED WITH: PHARMD A. MEYER [id number] @ 1604 FH    Staphylococcus aureus (BCID) DETECTED (A) NOT DETECTED Final    Comment: CRITICAL RESULT CALLED TO, READ BACK BY AND VERIFIED WITH: PHARMD A. MEYER [id number] @ 1604 FH    Staphylococcus epidermidis NOT DETECTED NOT DETECTED Final   Staphylococcus lugdunensis NOT DETECTED NOT DETECTED Final   Streptococcus species NOT DETECTED NOT DETECTED Final   Streptococcus agalactiae NOT DETECTED NOT DETECTED Final   Streptococcus pneumoniae NOT DETECTED NOT DETECTED Final   Streptococcus pyogenes NOT DETECTED NOT DETECTED Final   A.calcoaceticus-baumannii NOT DETECTED NOT DETECTED Final   Bacteroides fragilis NOT DETECTED NOT DETECTED Final   Enterobacterales NOT DETECTED NOT DETECTED Final   Enterobacter cloacae complex NOT DETECTED NOT DETECTED Final   Escherichia coli NOT DETECTED NOT DETECTED Final   Klebsiella aerogenes NOT DETECTED NOT DETECTED Final   Klebsiella oxytoca NOT DETECTED NOT DETECTED Final   Klebsiella pneumoniae NOT DETECTED NOT DETECTED Final   Proteus species NOT DETECTED NOT DETECTED Final   Salmonella species NOT DETECTED NOT DETECTED Final   Serratia marcescens NOT DETECTED NOT DETECTED Final   Haemophilus influenzae NOT DETECTED NOT DETECTED Final   Neisseria meningitidis NOT DETECTED NOT DETECTED Final   Pseudomonas aeruginosa NOT DETECTED NOT DETECTED Final   Stenotrophomonas maltophilia NOT DETECTED  NOT DETECTED Final   Candida albicans NOT DETECTED NOT DETECTED Final   Candida auris NOT DETECTED NOT DETECTED Final   Candida glabrata  NOT DETECTED NOT DETECTED Final   Candida krusei NOT DETECTED NOT DETECTED Final   Candida parapsilosis NOT DETECTED NOT DETECTED Final   Candida tropicalis NOT DETECTED NOT DETECTED Final   Cryptococcus neoformans/gattii NOT DETECTED NOT DETECTED Final   Meth resistant mecA/C and MREJ NOT DETECTED NOT DETECTED Final    Comment: Performed at [facility], [address] [place], [place] [address]  Resp panel by RT-PCR (RSV, Flu A&B, Covid) Anterior Nasal Swab     Status: None   Collection Time: 07/04/23  3:46 PM   Specimen: Anterior Nasal Swab  Result Value Ref Range Status   SARS Coronavirus 2 by RT PCR NEGATIVE NEGATIVE Final   Influenza A by PCR NEGATIVE NEGATIVE Final   Influenza B by PCR NEGATIVE NEGATIVE Final    Comment: (NOTE) The Xpert Xpress SARS-CoV-2/FLU/RSV plus assay is intended as an aid in the diagnosis of influenza from Nasopharyngeal swab specimens and should not be used as a sole basis for treatment. Nasal washings and aspirates are unacceptable for Xpert Xpress SARS-CoV-2/FLU/RSV testing.  Fact Sheet for Patients: BloggerCourse.com  Fact Sheet for Healthcare Providers: SeriousBroker.it  This test is not yet approved or cleared by the [place] FDA and has been authorized for detection and/or diagnosis of SARS-CoV-2 by FDA under an Emergency Use Authorization (EUA). This EUA will remain in effect (meaning this test can be used) for the duration of the COVID-19 declaration under Section 564(b)(1) of the Act, 21 U.S.C. section 360bbb-3(b)(1), unless the authorization is terminated or revoked.     Resp Syncytial Virus by PCR NEGATIVE NEGATIVE Final    Comment: (NOTE) Fact Sheet for Patients: BloggerCourse.com  Fact Sheet for Healthcare Providers: SeriousBroker.it  This test is not yet approved or cleared by the [place] FDA and has been  authorized for detection and/or diagnosis of SARS-CoV-2 by FDA under an Emergency Use Authorization (EUA). This EUA will remain in effect (meaning this test can be used) for the duration of the COVID-19 declaration under Section 564(b)(1) of the Act, 21 U.S.C. section 360bbb-3(b)(1), unless the authorization is terminated or revoked.  Performed at [facility], [address], [place], [place] [address]   Body fluid culture w Gram Stain     Status: None   Collection Time: 07/04/23  7:34 PM   Specimen: Peritoneal Dialysate; Body Fluid  Result Value Ref Range Status   Specimen Description PERITONEAL DIALYSATE ABDOMEN  Final   Special Requests NONE  Final   Gram Stain   Final    ABUNDANT WBC PRESENT, PREDOMINANTLY PMN NO ORGANISMS SEEN Performed at [facility], [address], [place], [place] [address]    Culture MODERATE STAPHYLOCOCCUS AUREUS  Final   Report Status 07/06/2023 FINAL  Final   Organism ID, Bacteria STAPHYLOCOCCUS AUREUS  Final      Susceptibility   Staphylococcus aureus - MIC*    CIPROFLOXACIN <=0.5 SENSITIVE Sensitive     ERYTHROMYCIN <=0.25 SENSITIVE Sensitive     GENTAMICIN <=0.5 SENSITIVE Sensitive     OXACILLIN <=0.25 SENSITIVE Sensitive     TETRACYCLINE <=1 SENSITIVE Sensitive     VANCOMYCIN 1 SENSITIVE Sensitive     TRIMETH/SULFA <=10 SENSITIVE Sensitive     CLINDAMYCIN <=0.25 SENSITIVE Sensitive     RIFAMPIN <=0.5 SENSITIVE Sensitive     Inducible Clindamycin  NEGATIVE Sensitive     LINEZOLID 2 SENSITIVE Sensitive     * MODERATE STAPHYLOCOCCUS AUREUS  Culture, blood (Routine x 2)     Status: None (Preliminary result)   Collection Time: 07/04/23  9:08 PM   Specimen: BLOOD LEFT ARM  Result Value Ref Range Status   Specimen Description BLOOD LEFT ARM  Final   Special Requests   Final    BOTTLES DRAWN AEROBIC AND ANAEROBIC Blood Culture results may not be optimal due to an inadequate volume of blood received in culture bottles   Culture    Final    NO GROWTH 2 DAYS Performed at [facility], [address], [place], [place] [address]    Report Status PENDING  Incomplete  MRSA Next Gen by PCR, Nasal     Status: None   Collection Time: 07/05/23 12:12 AM   Specimen: Nasal Mucosa; Nasal Swab  Result Value Ref Range Status   MRSA by PCR Next Gen NOT DETECTED NOT DETECTED Final    Comment: (NOTE) The GeneXpert MRSA Assay (FDA approved for NASAL specimens only), is one component of a comprehensive MRSA colonization surveillance program. It is not intended to diagnose MRSA infection nor to guide or monitor treatment for MRSA infections. Test performance is not FDA approved in patients less than 67 years old. Performed at [facility], [address], [place], [place] [address]   Body fluid culture w Gram Stain     Status: None (Preliminary result)   Collection Time: 07/05/23  7:19 AM   Specimen: Peritoneal Washings; Body Fluid  Result Value Ref Range Status   Specimen Description PERITONEAL  Final   Special Requests NONE  Final   Gram Stain   Final    ABUNDANT WBC PRESENT, PREDOMINANTLY PMN RARE GRAM POSITIVE COCCI    Culture   Final    CULTURE REINCUBATED FOR BETTER GROWTH Performed at [facility], [address] [place], [place] [address]    Report Status PENDING  Incomplete    Rexene Alberts, MSN, NP-C [facility] Pager: [phone]  07/06/2023 10:32 AM

## 2023-07-06 NOTE — Hospital Course (Addendum)
 This is a 28 year old female with past medical history T1DM, HTN, autoimmune thyroiditis and ERSD - on PD admitted to [facility] for Septic Shock 2/2 Acute bacterial peritonitis 2/2 infected PD catheter, blood cultures positive for MSSA bacteremia. She is off Levophed, BP stable. On IV Ancef and Midodrine 5 mg TID. Nephrology and ID are on board. She got CCPD overnight 2/25-2/26 and planned for another tonight. Nephrology doing daily PD fluid analysis to trend infection. So far PD cultures on 2/24 and 2/25 growing Staph Aureus. ON PRN dilaudid, zofran and compazine. ECHO negative for vegetation. Otherwise stable to transfer out of ICU. Thank you. Nephrology and ID will keep following.

## 2023-07-06 NOTE — Progress Notes (Signed)
   07/06/23 2014  Cycler Setup  Total Number of Night Cycles 5  Night Fill Volume 1500  Dianeal Solution Dextrose 1.5% in 6000 mL Low Cal/Low Mag  Night Dwell Time per Cycle - Hour(s) 1  Night Dwell Time per Cycle - Minute(s) 30  Night Time Therapy - Hour(s) 10  Minimum Initial Drain Volume 0  Maximum Peritoneal Volume 3000  Night/Total Therapy Volume 00938  Completion  Treatment Status Started  Hand-off documentation  Hand-off Given Given to shift RN/LPN  Report given to (Full Name) [facility]  Hand-off Received Received from shift RN/LPN  Report received from (Full Name) Larene Pickett Larsen Dungan   Peritoneal fluid sample sent to lab.

## 2023-07-06 NOTE — Progress Notes (Signed)
 NAME:  Lindsey Murillo, MRN:  [id number], DOB:  May 03, 1996, LOS: 2 ADMISSION DATE:  07/04/2023, CONSULTATION DATE:  07/05/2023 REFERRING MD:  Karene Fry- EDP, CHIEF COMPLAINT:  Abdominal pain    History of Present Illness:  28yo female pt with T1DM, HTN, autoimmune thyroiditis and ERSD - on PD , prior on HD.  Patient admitted with 8/10 abdominal pain for 4 days, associated with worsening cough and n/v/d thus patient sought medical attention. In the ER patient had peritoneal cultures drawn, concerning for viral gastroenteritis and peritonitis.  Nephrologist saw the patient in the ER. Patient will be admitted to the ICU on norepi, septic shock.   Initial Labs: Na 134, K 2.6, Bicarb 17, BUN 36, Cr 10.60, Albumin 1.7. WBC 10.0, Hgb 10.5. PT 17.2 INR 1.4, Lactic 2.1.   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   Significant Hospital Events: Including procedures, antibiotic start and stop dates in addition to other pertinent events   2/25 Admitted to PCCM on Levophed, Blood culture + MSSA bacteremia. Initially on IV Vanc and Fortez. Transitioned to IV Ancef CCPD overnight   Interim History / Subjective:  Laying in bed with head of bed elevated. Eating breakfast Reports abdominal discomfort Denies any current nausea and vomiting  Objective   Blood pressure 106/67, pulse 90, temperature 97.9 F (36.6 C), temperature source Oral, resp. rate (!) 9, height 5\' 8"  (1.727 m), weight 43.4  kg, SpO2 98%.        Intake/Output Summary (Last 24 hours) at 07/06/2023 1527 Last data filed at 07/06/2023 0900 Gross per 24 hour  Intake 199.98 ml  Output --  Net 199.98 ml   Filed Weights   07/04/23 1917 07/05/23 0032  Weight: 56.7 kg 43.4 kg    Examination: General: Laying in bed with head of bed elevated  HENT: +Clouding of the L pupil. Left pupil is not constricting to light or accomodates. Reports loss of vision L eye, chronic. R eye constricts to light and accomodates. EOMI intact.    Lungs: CTAB Cardiovascular: RR.  Abdomen: tender to palpation  Extremities: warm. No LE edema bilaterally. RUE AV fistula  Neuro: A O x4. Flat affect.  GU: Not assessed.   Resolved Hospital Problem list   None   Assessment & Plan:  Septic Shock 2/2 Acute bacterial peritonitis 2/2 infected PD catheter  MSSA bacteremia  ESRD on CCPD  Hypokalemia Leukocytosis - Off Levo, maintaining MAP > 65 - IV Ancef  - PD cultures on 2/24 and 2/25 growing Staph Aureus  - Repeat PD fluid analysis to trend infection - per nephro   - Follow up repeat blood cultures  - IV dilaudid 0.5 mg q2h PRN + bowel regimen  - PRN IV Zofran 4 mg q6h and Compazine 5mg  q4h for nausea and vomiting  - TTE findings no vegetation - Midodrine 5 mg TID     Anemia, Normocytic Hgb 9.9 today, baseline ~10-12. Monitor    T1DM - CBG 42 afternoon  - Continue Semglee 5 units at bedtime - SSI    HLD - Continue home medication Crestor   Chronic Left Eye vision loss Hx of  Left Eye cataract surgery Patient reports she had retinal detachment of the L eye leading to Left eye cataract surgery on May 19, 2022. Reports that since after the cataract surgery, she has vision loss. States it has been about 10 months. Reports that she has an appointment with her March 12th 2025 with her ophthalmologist for R eye surgery. Therefore, I do not think that her current ocular exam is an acute finding. No emergent need for Ophthalmology  consult.    Best Practice (right click and "Reselect all SmartList Selections" daily)   Diet/type: Renal and Carb  DVT prophylaxis prophylactic heparin  Pressure ulcer(s): Assessed by RN  GI prophylaxis: H2B Lines: Peripherals. RUE AV fistula  Foley:  N/A Code Status:  full code Last date of multidisciplinary goals of care discussion [--]  Labs   CBC: Recent Labs  Lab 07/04/23 1542 07/05/23 0113 07/05/23 0754 07/06/23 0325  WBC 10.0 20.6* 20.5* 15.8*  NEUTROABS 9.0*  --   --   --   HGB 10.5* 10.6* 10.3* 9.9*  HCT 32.1* 31.9* 31.0* 29.6*  MCV 89.7 88.6 87.8 88.1  PLT 244 256 257 294    Basic Metabolic Panel: Recent Labs  Lab 07/04/23 1542 07/04/23 2107 07/04/23 2230 07/05/23 0113 07/05/23 0124 07/05/23 0754 07/05/23 1248 07/06/23 0325  NA 134*  --  135 135  --  134*  --  132*  134*  K 2.6*  --  2.3* 2.9*  --  2.5* 3.5 3.5  3.6  CL 105  --  103 106  --  105  --  100  101  CO2 17*  --  20* 19*  --  18*  --  18*  20*  GLUCOSE 133*  --  132* 107*  --  98  --  208*  209*  BUN 36*  --  35* 36*  --  38*  --  35*  34*  CREATININE 10.60*  --  9.84* 10.21*  --  10.37*  --  8.88*  8.86*  CALCIUM 7.8*  --  7.1* 7.3*  --  7.2*  --  7.5*  7.5*  MG  --  1.4* 1.8  --  2.0 1.9  --  1.9  PHOS  --   --   --  4.2 4.2  --   --  3.6   GFR: Estimated Creatinine Clearance: 6.5 mL/min (A) (by C-G formula based on SCr of 8.86 mg/dL (H)). Recent Labs  Lab 07/04/23 1542 07/04/23 1546 07/04/23 2109 07/05/23 0113 07/05/23 0754 07/06/23 0325  WBC 10.0  --   --  20.6* 20.5* 15.8*  LATICACIDVEN  --  2.1* 0.7  --   --   --     Liver Function Tests: Recent Labs  Lab 07/04/23 1542 07/05/23 0113 07/06/23 0325  AST 10*  --   --   ALT 10  --   --   ALKPHOS 84  --   --   BILITOT 0.6  --   --   PROT 5.4*  --   --   ALBUMIN 1.7* <1.5* <1.5*   No results for input(s): "LIPASE", "AMYLASE" in the last 168 hours. No results for input(s): "AMMONIA" in the last 168  hours.  ABG    Component Value Date/Time   HCO3 30.6 (H) 12/01/2022 1047   TCO2 24 01/12/2022 0721   ACIDBASEDEF 8.9 (H) 06/13/2021 0003   O2SAT 57.5 12/01/2022 1047     Coagulation Profile: Recent Labs  Lab 07/04/23 1542  INR 1.4*    Cardiac Enzymes: No results for input(s): "CKTOTAL", "CKMB", "CKMBINDEX", "TROPONINI" in the last 168 hours.  HbA1C: Hemoglobin A1C  Date/Time Value Ref Range Status  06/23/2023 03:48 PM 7.2 (A) 4.0 - 5.6 % Final  12/13/2018 12:00 AM >15.5  Final   HbA1c, POC (controlled diabetic range)  Date/Time Value Ref Range Status  01/21/2022 11:15 AM 8.3 (A) 0.0 - 7.0 % Final   HbA1c POC (<> result, manual entry)  Date/Time Value Ref Range Status  06/09/2018 09:28 AM >13 4.0 - 5.6 % Final   Hgb A1c MFr Bld  Date/Time Value Ref Range Status  12/14/2022 03:46 PM 10.5 (H) 4.8 - 5.6 % Final    Comment:             Prediabetes: 5.7 - 6.4          Diabetes: >6.4          Glycemic control for adults with diabetes: <7.0   07/20/2022 02:40 PM 10.5 (H) 4.8 - 5.6 % Final    Comment:             Prediabetes: 5.7 - 6.4          Diabetes: >6.4          Glycemic control for adults with diabetes: <7.0     CBG: Recent Labs  Lab 07/05/23 1552 07/05/23 2121 07/06/23 0740 07/06/23 1158 07/06/23 1219  GLUCAP 97 124* 153* 42* 151*    Review of Systems:   As above   Past Medical History:  She,  has a past medical history of Acanthosis nigricans, acquired, 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 chronic kidney disease ([name]) (09/25/2021), Asthma, 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    Surgical History:   Past Surgical History:  Procedure Laterality Date   AV FISTULA PLACEMENT Right 12/08/2021   Procedure: ARTERIOVENOUS  FISTULA CREATION VERSUS GRAFT;  Surgeon: Larina Earthly, MD;  Location: [facility];  Service: Vascular;  Laterality: Right;   BASCILIC VEIN TRANSPOSITION Right 01/12/2022   Procedure: RIGHT ARM SECOND STAGE BASILIC VEIN TRANSPOSITION;  Surgeon: Larina Earthly, MD;  Location: [facility];  Service: Vascular;  Laterality: Right;   BIOPSY  08/27/2016   Procedure: BIOPSY;  Surgeon: West Bali, MD;  Location: [facility];  Service: Endoscopy;;  duodenum; gastric   BIOPSY  06/27/2019   Procedure: BIOPSY;  Surgeon: Corbin Ade, MD;  Location: [facility];  Service: Endoscopy;;   BIOPSY  02/09/2022   Procedure: BIOPSY;  Surgeon: Lanelle Bal, DO;  Location: [facility];  Service: Endoscopy;;   BREAST CYST EXCISION Right 11/12/2021   Procedure: RIGHT BREAST ABSCESS INCISION AND DRAINAGE;  Surgeon: Emelia Loron, MD;  Location: [facility] OR;  Service: General;  Laterality: Right;  LMA   COLONOSCOPY     COLONOSCOPY WITH PROPOFOL N/A 02/09/2022   Procedure: COLONOSCOPY WITH PROPOFOL;  Surgeon: Lanelle Bal, DO;  Location: [facility];  Service: Endoscopy;  Laterality: N/A;  7:30am, asa 3, dialysis pt   ESOPHAGOGASTRODUODENOSCOPY N/A 08/27/2016   Dr. Darrick Penna: mild gastritis. Negative celiac. No obvious source for dyspepsia/diarrhea   ESOPHAGOGASTRODUODENOSCOPY (EGD) WITH PROPOFOL N/A 06/27/2019   rourk: Focal abnormality of the gastric mucosa likely due  to trauma (heaving).  Biopsy showed mild gastritis, negative for H. pylori.  Esophageal dilation for history of dysphagia but normal-appearing esophagus.   ESOPHAGOGASTRODUODENOSCOPY (EGD) WITH PROPOFOL N/A 02/09/2022   Procedure: ESOPHAGOGASTRODUODENOSCOPY (EGD) WITH PROPOFOL;  Surgeon: Lanelle Bal, DO;  Location: [facility];  Service: Endoscopy;  Laterality: N/A;   IR FLUORO GUIDE CV LINE RIGHT  10/13/2021    IR FLUORO GUIDE CV LINE RIGHT  10/19/2021   IR US GUIDE VASC ACCESS RIGHT  10/13/2021   IRRIGATION AND DEBRIDEMENT ABSCESS N/A 10/04/2021   Procedure: IRRIGATION AND DEBRIDEMENT NECK ABSCESS;  Surgeon: Axel Filler, MD;  Location: [facility] OR;  Service: General;  Laterality: N/A;     Social History:   reports that she has never smoked. She has never been exposed to tobacco smoke. She has never used smokeless tobacco. She reports that she does not drink alcohol and does not use drugs.   Family History:  Her family history includes Alcohol abuse in her paternal grandfather; Cancer in her father; Diabetes in her mother; Huntington's disease in her maternal grandfather. There is no history of Colon cancer or Colon polyps.   Allergies No Known Allergies   Home Medications  Prior to Admission medications   Medication Sig Start Date End Date Taking? Authorizing Provider  B Complex-C-Folic Acid (DIALYVITE 800) 0.8 MG TABS Take 1 tablet by mouth daily. 11/16/21  Yes [provider]  calcitRIOL (ROCALTROL) 0.5 MCG capsule Take 1 mcg by mouth daily.   Yes [provider]  cinacalcet (SENSIPAR) 30 MG tablet Take 30 mg by mouth. 06/10/23  Yes [provider]  gentamicin cream (GARAMYCIN) 0.1 % Apply 1 Application topically daily.   Yes [provider]  Insulin Aspart, w/Niacinamide, (FIASP) 100 UNIT/ML SOLN Inject 5-8 Units as directed with breakfast, with lunch, and with evening meal. Sliding Scale   Yes [provider]  medroxyPROGESTERone (PROVERA) 10 MG tablet Take provera 10 mg for 10 days every 3 months 11/16/22  Yes Cyril Mourning A, NP  ondansetron (ZOFRAN-ODT) 4 MG disintegrating tablet Take 1 tablet (4 mg total) by mouth every 8 (eight) hours as needed for nausea or vomiting. 06/08/23  Yes Pollyann Savoy, MD  pantoprazole (PROTONIX) 40 MG tablet Take 1 tablet (40 mg total) by mouth 2 (two) times daily before a meal. 03/22/23 03/16/24 Yes Mahon,  Courtney L, NP  potassium chloride SA (KLOR-CON M) 20 MEQ tablet Take 20 mEq by mouth 2 (two) times daily. 06/23/23  Yes [provider]  rosuvastatin (CRESTOR) 10 MG tablet Take 1 tablet (10 mg total) by mouth daily. 03/15/23  Yes Cook, Jayce G, DO  scopolamine (TRANSDERM-SCOP) 1 MG/3DAYS Place 1 patch (1.5 mg total) onto the skin every 3 (three) days. 02/02/23  Yes Roemhildt, Lorin T, PA-C  sucroferric oxyhydroxide (VELPHORO) 500 MG chewable tablet Chew 1,000 mg by mouth 3 (three) times daily with meals. 11/16/21  Yes [provider]  TRESIBA FLEXTOUCH 100 UNIT/ML FlexTouch Pen Inject 15 Units into the skin at bedtime. 06/23/23  Yes Reardon, Freddi Starr, NP  VENTOLIN HFA 108 (90 Base) MCG/ACT inhaler Inhale 2 puffs into the lungs every 4 (four) hours as needed for wheezing or shortness of breath. 2 puffs once every 4 hours as needed for cough, wheeze, shortness of breath and chest tightness. May use 2 puffs 5 to 15 minutes before activity to decrease cough or wheeze. 03/24/23  Yes Padgett, Pilar Grammes, MD  Vitamin D, Ergocalciferol, (DRISDOL) 1.25 MG (50000 UNIT)  CAPS capsule Take 50,000 Units by mouth every 7 (seven) days.   Yes [provider]  acetaminophen (TYLENOL) 325 MG tablet Take 325 mg by mouth every 6 (six) hours as needed for mild pain (pain score 1-3) or moderate pain (pain score 4-6). Patient not taking: Reported on 07/04/2023 12/24/21   [provider]  cetirizine HCl (ZYRTEC) 5 MG/5ML SOLN TAKE 1.25 ML TWICE A DAY AS NEEDED FOR A RUNNY NOSE OR ITCH Patient not taking: Reported on 07/04/2023 05/09/23   Marcelyn Bruins, MD  Continuous Glucose Receiver (DEXCOM G7 RECEIVER) DEVI by Does not apply route.    [provider]  Continuous Glucose Sensor (DEXCOM G7 SENSOR) MISC by Does not apply route.    [provider]  fluticasone (FLONASE) 50 MCG/ACT nasal spray Place 2 sprays into both nostrils daily as needed for allergies or  rhinitis. Patient not taking: Reported on 07/04/2023 12/23/22   Hetty Blend, FNP  SSD 1 % cream APPLY CREAM TO WOUND DAILY AND COVER WITH GAUZE DRESSING 04/01/23   McCaughan, Dia D, DPM     Critical care time:

## 2023-07-06 NOTE — Progress Notes (Addendum)
   07/06/23 1103  Completion  Treatment Status Complete  Initial Drain Volume 406  Average Dwell Time-Hour(s) 1  Average Dwell Time-Min(s) 30  Average Drain Time 1.15  Total Therapy Volume 78295  Total Therapy Time-Hour(s) 15  Total Therapy Time-Min(s) 2  Weight after Drain 97 lb (44 kg)  Effluent Appearance Amber;Cloudy;Fibrin;Red  Procedure Comments  Tolerated treatment well? Yes  Peritoneal Dialysis Comments fibrin and blood present in effluent, denies increased pain/discomfort with drain/fill  Hand-off documentation  Hand-off Given Given to shift RN/LPN  Report given to (Full Name) Larena Sox, RN

## 2023-07-07 ENCOUNTER — Inpatient Hospital Stay (HOSPITAL_COMMUNITY): Payer: 59

## 2023-07-07 ENCOUNTER — Ambulatory Visit: Payer: 59 | Admitting: Allergy

## 2023-07-07 DIAGNOSIS — K658 Other peritonitis: Secondary | ICD-10-CM | POA: Diagnosis not present

## 2023-07-07 DIAGNOSIS — Z992 Dependence on renal dialysis: Secondary | ICD-10-CM

## 2023-07-07 DIAGNOSIS — A419 Sepsis, unspecified organism: Secondary | ICD-10-CM

## 2023-07-07 DIAGNOSIS — R6521 Severe sepsis with septic shock: Secondary | ICD-10-CM | POA: Diagnosis not present

## 2023-07-07 DIAGNOSIS — T8571XS Infection and inflammatory reaction due to peritoneal dialysis catheter, sequela: Secondary | ICD-10-CM

## 2023-07-07 DIAGNOSIS — T8571XA Infection and inflammatory reaction due to peritoneal dialysis catheter, initial encounter: Secondary | ICD-10-CM | POA: Diagnosis not present

## 2023-07-07 DIAGNOSIS — N186 End stage renal disease: Secondary | ICD-10-CM

## 2023-07-07 DIAGNOSIS — B9561 Methicillin susceptible Staphylococcus aureus infection as the cause of diseases classified elsewhere: Secondary | ICD-10-CM | POA: Diagnosis not present

## 2023-07-07 LAB — CULTURE, BLOOD (ROUTINE X 2)

## 2023-07-07 LAB — RENAL FUNCTION PANEL
Albumin: <1.5 g/dL — ABNORMAL LOW (ref 3.5–5.0)
Anion gap: 14 (ref 5–15)
BUN: 33 mg/dL — ABNORMAL HIGH (ref 6–20)
CO2: 17 mmol/L — ABNORMAL LOW (ref 22–32)
Calcium: 7.2 mg/dL — ABNORMAL LOW (ref 8.9–10.3)
Chloride: 100 mmol/L (ref 98–111)
Creatinine, Ser: 7.91 mg/dL — ABNORMAL HIGH (ref 0.44–1.00)
GFR, Estimated: 7 mL/min — ABNORMAL LOW (ref >60)
Glucose, Bld: 201 mg/dL — ABNORMAL HIGH (ref 70–99)
Phosphorus: 3.3 mg/dL (ref 2.5–4.6)
Potassium: 3.3 mmol/L — ABNORMAL LOW (ref 3.5–5.1)
Sodium: 131 mmol/L — ABNORMAL LOW (ref 135–145)

## 2023-07-07 LAB — GLUCOSE, CAPILLARY
Glucose-Capillary: 104 mg/dL — ABNORMAL HIGH (ref 70–99)
Glucose-Capillary: 126 mg/dL — ABNORMAL HIGH (ref 70–99)
Glucose-Capillary: 137 mg/dL — ABNORMAL HIGH (ref 70–99)
Glucose-Capillary: 170 mg/dL — ABNORMAL HIGH (ref 70–99)
Glucose-Capillary: 170 mg/dL — ABNORMAL HIGH (ref 70–99)

## 2023-07-07 LAB — BODY FLUID CELL COUNT WITH DIFFERENTIAL: Total Nucleated Cell Count, Fluid: 0 uL (ref 0–1000)

## 2023-07-07 LAB — CBC
HCT: 29.9 % — ABNORMAL LOW (ref 36.0–46.0)
Hemoglobin: 10 g/dL — ABNORMAL LOW (ref 12.0–15.0)
MCH: 29.2 pg (ref 26.0–34.0)
MCHC: 33.4 g/dL (ref 30.0–36.0)
MCV: 87.2 fL (ref 80.0–100.0)
Platelets: 205 10*3/uL (ref 150–400)
RBC: 3.43 MIL/uL — ABNORMAL LOW (ref 3.87–5.11)
RDW: 19.7 % — ABNORMAL HIGH (ref 11.5–15.5)
WBC: 13.5 10*3/uL — ABNORMAL HIGH (ref 4.0–10.5)
nRBC: 0.2 % (ref 0.0–0.2)

## 2023-07-07 LAB — BODY FLUID CULTURE W GRAM STAIN

## 2023-07-07 LAB — MAGNESIUM: Magnesium: 1.9 mg/dL (ref 1.7–2.4)

## 2023-07-07 MED ORDER — DELFLEX-LC/1.5% DEXTROSE 344 MOSM/L IP SOLN: INTRAPERITONEAL | Status: DC

## 2023-07-07 MED ORDER — GENTAMICIN SULFATE 0.1 % EX CREA: 1.0000 | TOPICAL_CREAM | Freq: Every day | CUTANEOUS | Status: DC

## 2023-07-07 NOTE — Plan of Care (Signed)

## 2023-07-07 NOTE — Progress Notes (Signed)
 [facility] Progress Note  Name: Lindsey Murillo MRN: [id number] DOB: 08-28-1995  Chief Complaint:  Abdominal pain  Subjective:  She was moved to the floor in the interim.  Strict ins/outs are not available.  She had two unmeasured urine voids over 2/26.  See a note that there was fibrin and blood present in effluent on 2/26 am per PD RN.  Spoke with outpatient PD RN.  Patient has worked very hard to be able to do PD (got her own place, got her blood sugar under control) however recently she has been sick often. She has previously stated uses 1.5% dextrose often at home.  She states that she feels like her vision/eyes are at baseline.  UF: overnight 2/26 - 2/27 - negative 215 mL (gain)  overnight 2/25 - 2/26 - no total UF is charted   Review of systems:    She reports chest discomfort has resolved No more n/v No shortness of breath  Does make urine at home but not here  ------------- Background on consult:  Lindsey Murillo is a 28 y.o. female with a PMH significant for type 1 DM,  HTN, autoimmune thyroiditis, and ESRD on CCPD who presented to [facility] ED today after developing abdominal pain this morning.  She has not been feeling well and has had N/V/D for the past 4-5 days as well as a cough.  She denies any cloudy PD fluid and reports that her abdominal pain just started today.  In the ED, temp max 100.7, bp 82/46, HR 110, SpO2 93% on room air.  Labs notable for WBC 10, Hgb 10.5, plt 244, Na 134, K 2.6, Co2 17, BUN 36, Cr 10.6, Ca 7.8, alb 1.7.  Presentation concerning for SBP and we were consulted to manage her PD and send off PD fluid for studies during her hospitalization.  Of note, she was not doing her PD correctly for the past week and was 10 kg above edw.  She was not doing the proper amount of exchanges and was reabsorbing.  She declined coming in for HD session to help with her volume, however she declined and performed CAPD at home.  She now appears hypovolemic  due to vomiting and diarrhea which predated her abdominal pain and may have a viral gastroenteritis.  Outpatient PD RN reports pt on 6 exchanges over night with 2.4 fills.  Dwell time 1.5 hours.  EDW is 50 kg. She was just changed from CAPD to CCPD   Intake/Output Summary (Last 24 hours) at 07/07/2023 1159 Last data filed at 07/07/2023 1100 Gross per 24 hour  Intake -164.97 ml  Output 200 ml  Net -364.97 ml    Vitals:  Vitals:   07/06/23 2314 07/07/23 0000 07/07/23 0400 07/07/23 1000  BP: 122/82 95/77 121/82 111/79  Pulse: 91 76 84 98  Resp: 20 16 10 16   Temp: 98.3 F (36.8 C) 98.1 F (36.7 C) 98 F (36.7 C) 97.6 F (36.4 C)  TempSrc: Oral Oral Oral   SpO2: 98% 96% 98% 96%  Weight:      Height:         Physical Exam:    General adult female in bed in no acute distress HEENT normocephalic atraumatic extraocular movements intact sclera anicteric Neck supple trachea midline Lungs clear to auscultation bilaterally normal work of breathing at rest on room air Heart S1S2 no rub Abdomen soft mildly tender to palpation; distended; PD catheter in place  Extremities no edema lower extremities Psych normal  mood and affect Neuro alert and oriented x 3 provides hx follows commands Access: PD catheter as above.  RUE AVF with bruit and thrill   Medications reviewed   Labs:     Latest Ref Rng & Units 07/07/2023    5:20 AM 07/06/2023    3:25 AM 07/05/2023   12:48 PM  BMP  Glucose 70 - 99 mg/dL 161  096    045    BUN 6 - 20 mg/dL 33  34    35    Creatinine 0.44 - 1.00 mg/dL 4.09  8.11    9.14    Sodium 135 - 145 mmol/L 131  134    132    Potassium 3.5 - 5.1 mmol/L 3.3  3.6    3.5  3.5   Chloride 98 - 111 mmol/L 100  101    100    CO2 22 - 32 mmol/L 17  20    18     Calcium 8.9 - 10.3 mg/dL 7.2  7.5    7.5     Outpatient PD RN:  Recently changed to cycler Mircera 50 mcg on 2/19 and receives this every two weeks usually or her hemoglobin will drop   Assessment/Plan:    PD-associated Bacterial peritonitis -  Moderate staph aureus on PD fluid culture Repeat PD fluid cell count today and daily for now - the 2/25 cell count doesn't correlate with the 2/26 cell count On cefazolin  If cell count does not improve or if ID feels is appropriate then may need tunneled catheter removed  ESRD -   Will plan on CCPD tonight using all 1.5% bags for now   Ordered strict ins/outs  Note that she does have an AVF as well  Staph aureus bacteremia - on cefazolin.  Note blood cultures drawn today   Type 1 DM - per primary team Hypertension/volume  - despite trace pretibial edema and being above edw, she appeared volume depleted intravascularly and was hypotensive on arrival. Gentle PD as above. She is on midodrine 5 mg TID  Anemia of CKD - Hgb acceptable.  Metabolic bone disease -  phos controlled. On sensipar and calcitriol Hypokalemia - likely from GI losses.  on potassium 20 meq BID   Disposition - continue inpatient monitoring   Estanislado Emms, MD 07/07/2023 12:37 PM

## 2023-07-07 NOTE — Plan of Care (Signed)
  Problem: Education: Goal: Knowledge of General Education information will improve Description: Including pain rating scale, medication(s)/side effects and non-pharmacologic comfort measures Outcome: Progressing   Problem: Clinical Measurements: Goal: Diagnostic test results will improve Outcome: Progressing Goal: Respiratory complications will improve Outcome: Progressing Goal: Cardiovascular complication will be avoided Outcome: Progressing   Problem: Nutrition: Goal: Adequate nutrition will be maintained Outcome: Progressing   Problem: Elimination: Goal: Will not experience complications related to bowel motility Outcome: Progressing   Problem: Pain Managment: Goal: General experience of comfort will improve and/or be controlled Outcome: Progressing   Problem: Skin Integrity: Goal: Risk for impaired skin integrity will decrease Outcome: Progressing   Problem: Metabolic: Goal: Ability to maintain appropriate glucose levels will improve Outcome: Progressing   Problem: Nutritional: Goal: Maintenance of adequate nutrition will improve Outcome: Progressing Goal: Progress toward achieving an optimal weight will improve Outcome: Progressing

## 2023-07-07 NOTE — Care Management Important Message (Signed)
 Important Message  Patient Details  Name: Lindsey Murillo MRN: [id number] Date of Birth: Oct 08, 1995   Important Message Given:  Yes - Medicare IM     Sherilyn Banker 07/07/2023, 12:39 PM

## 2023-07-07 NOTE — Progress Notes (Signed)
 Patient is due for signing consent for TEE wants to wait for her mother to arrive.

## 2023-07-07 NOTE — Progress Notes (Signed)
   [facility] has been requested to perform a transesophageal echocardiogram on [facility] for bacteremia.    The patient does NOT have any absolute or relative contraindications to a Transesophageal Echocardiogram (TEE).  The patient has: No other conditions that may impact this procedure.    After careful review of history and examination, the risks and benefits of transesophageal echocardiogram have been explained including risks of esophageal damage, perforation (1:10,000 risk), bleeding, pharyngeal hematoma as well as other potential complications associated with conscious sedation including aspiration, arrhythmia, respiratory failure and death. Alternatives to treatment were discussed, questions were answered. Patient is willing to proceed.   Signed, Olena Leatherwood, PA-C  07/07/2023 3:47 PM

## 2023-07-07 NOTE — Progress Notes (Addendum)
 PROGRESS NOTE        PATIENT DETAILS Name: Lindsey Murillo Age: 28 y.o. Sex: female Date of Birth: Jun 13, 1995 Admit Date: 07/04/2023 Admitting Physician Madaline Brilliant, DO ZOX:WRUE, Verdis Frederickson, DO  Brief Summary: Patient is a 28 y.o.  female with history of ESRD on peritoneal dialysis, DM-1, HTN-who presented with abdominal pain-she was found to have septic shock in the setting of PD catheter associated peritonitis and MSSA bacteremia.  She was initially admitted to the ICU-required pressors-evaluated by ID/nephrology-placed on appropriate antimicrobial therapy-stabilized and subsequently transferred to [facility].  See below for further details.  Significant events: 2/25>> septic shock-PD catheter associated peritonitis/MSSA bacteremia-on Levophed 2/26 >> off Levophed 2/27>> transferred to [facility].  Significant studies: 2/24>> CXR: Strandy/patchy opacities left lower lobe-atelectasis versus pneumonia 2/24>> CT abdomen pelvis: 1.  Fluid in the abdomen/pelvis, PD catheter in place 2/26>> echo: EF 50-55%, grade 2 diastolic dysfunction.  Significant microbiology data: 2/24>> COVID/influenza/RSV PCR: Negative 2/24>> peritoneal fluid culture: MSSA 2/24>> blood culture: MSSA 2/25>> peritoneal fluid culture: MSSA  Procedures: None  Consults: PCCM Nephrology ID  Subjective: Overall better-sleeping comfortably-mother at bedside-continue pain in abdomen-although better.  Objective: Vitals: Blood pressure 111/79, pulse 98, temperature 97.6 F (36.4 C), resp. rate 16, height 5\' 8"  (1.727 m), weight 43.4 kg, SpO2 96%.   Exam: Gen Exam:Alert awake-not in any distress HEENT:atraumatic, normocephalic Chest: B/L clear to auscultation anteriorly CVS:S1S2 regular Abdomen:soft mild tenderness in the mid abdominal area. Extremities:no edema Neurology: Non focal Skin: no rash  Pertinent Labs/Radiology:    Latest Ref Rng & Units 07/07/2023    5:20 AM 07/06/2023    3:25  AM 07/05/2023    7:54 AM  CBC  WBC 4.0 - 10.5 K/uL 13.5  15.8  20.5   Hemoglobin 12.0 - 15.0 g/dL 45.4  9.9  09.8   Hematocrit 36.0 - 46.0 % 29.9  29.6  31.0   Platelets 150 - 400 K/uL 205  294  257     Lab Results  Component Value Date   NA 131 (L) 07/07/2023   K 3.3 (L) 07/07/2023   CL 100 07/07/2023   CO2 17 (L) 07/07/2023      Assessment/Plan: Septic shock secondary to PD catheter related MSSA peritonitis and MSSA bacteremia. Clinically improved-sepsis physiology improving On IV Ancef TTE without any evidence of endocarditis ID following-await further recommendations.  ESRD on peritoneal dialysis Nephrology following ID/nephrology-contemplating whether to keep catheter in place or remove  Normocytic anemia Secondary to acute illness and ESRD Follow CBC  Hypokalemia Replete/recheck  DM-1 (A1c 7.2-2/13) Had episode of hypoglycemia yesterday-did not receive Lantus last night Will keep 5 units of Lantus as is-continue SSI-and see how she does with the CBGs tomorrow before adjusting any further.  Recent Labs    07/06/23 2119 07/07/23 0016 07/07/23 0929  GLUCAP 81 137* 170*    HLD Statin  ?  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  Chronic left eye vision loss-?  Retinal detachment Poor historian Apparently having some pain in her left eye-ID recommending ophthalmology evaluation to rule out endophthalmitis.  Have discussed with ophthalmology on-call-Dr.Pecen-she will evaluate later today.  Pressure Ulcer: Agree with assessment and plan as outlined Pressure Injury 07/05/23 Sacrum Mid Stage 2 -  Partial thickness loss of dermis presenting as a shallow open injury with a  red, pink wound bed without slough. 2x0.5 linear mid sacral wound (Active)  07/05/23 0000  Location: Sacrum  Location Orientation: Mid  Staging: Stage 2 -  Partial thickness loss of dermis presenting as a shallow open injury  with a red, pink wound bed without slough.  Wound Description (Comments): 2x0.5 linear mid sacral wound  Present on Admission: Yes  Dressing Type Other (Comment) 07/07/23 0845    Underweight: Estimated body mass index is 14.55 kg/m as calculated from the following:   Height as of this encounter: 5\' 8"  (1.727 m).   Weight as of this encounter: 43.4 kg.   Code status:   Code Status: Full Code   DVT Prophylaxis: heparin injection 5,000 Units Start: 07/04/23 2230 SCDs Start: 07/04/23 2229  Family Communication: Mother at bedside   Disposition Plan: Status is: Inpatient Remains inpatient appropriate because: Severity of illness   Planned Discharge Destination:Home health   Diet: Diet Order             Diet renal/carb modified with fluid restriction Diet-HS Snack? Nothing; Fluid restriction: 1200 mL Fluid; Room service appropriate? Yes; Fluid consistency: Thin  Diet effective now                     Antimicrobial agents: Anti-infectives (From admission, onward)    Start     Dose/Rate Route Frequency Ordered Stop   07/05/23 2000  ceFAZolin (ANCEF) IVPB 1 g/50 mL premix        1 g 100 mL/hr over 30 Minutes Intravenous Every 24 hours 07/05/23 1737     07/05/23 1800  ceFEPIme (MAXIPIME) 1 g in sodium chloride 0.9 % 100 mL IVPB  Status:  Discontinued        1 g 200 mL/hr over 30 Minutes Intravenous Every 24 hours 07/05/23 0423 07/05/23 0914   07/05/23 1800  cefTAZidime (FORTAZ) 1 g in sodium chloride 0.9 % 100 mL IVPB  Status:  Discontinued        1 g 200 mL/hr over 30 Minutes Intravenous Every 24 hours 07/05/23 0914 07/05/23 1643   07/05/23 0500  ceFEPIme (MAXIPIME) 1 g in sodium chloride 0.9 % 100 mL IVPB  Status:  Discontinued        1 g 200 mL/hr over 30 Minutes Intravenous Every 24 hours 07/05/23 0423 07/05/23 0423   07/05/23 0422  vancomycin variable dose per unstable renal function (pharmacist dosing)  Status:  Discontinued         Does not apply See admin  instructions 07/05/23 0423 07/05/23 1643   07/05/23 0345  vancomycin (VANCOCIN) IVPB 1000 mg/200 mL premix  Status:  Discontinued        1,000 mg 200 mL/hr over 60 Minutes Intravenous Every 12 hours 07/05/23 0259 07/05/23 0452   07/05/23 0345  ceFEPIme (MAXIPIME) 500 mg in dextrose 5 % 50 mL IVPB  Status:  Discontinued        500 mg 110 mL/hr over 30 Minutes Intravenous Every 12 hours 07/05/23 0259 07/05/23 0423   07/04/23 1745  ceFEPIme (MAXIPIME) 2 g in sodium chloride 0.9 % 100 mL IVPB        2 g 200 mL/hr over 30 Minutes Intravenous  Once 07/04/23 1739 07/04/23 1835   07/04/23 1745  vancomycin (VANCOREADY) IVPB 1500 mg/300 mL        1,500 mg 150 mL/hr over 120 Minutes Intravenous  Once 07/04/23 1743 07/04/23 2209        MEDICATIONS: Scheduled Meds:  calcitRIOL  1 mcg Oral Daily   Chlorhexidine Gluconate Cloth  6 each Topical Daily   cinacalcet  30 mg Oral Q breakfast   famotidine  10 mg Oral Daily   gentamicin cream  1 Application Topical Daily   heparin  5,000 Units Subcutaneous Q8H   insulin aspart  0-6 Units Subcutaneous TID WC   insulin glargine  5 Units Subcutaneous QHS   midodrine  5 mg Oral TID WC   multivitamin  1 tablet Oral Daily   potassium chloride  20 mEq Oral BID   rosuvastatin  10 mg Oral Daily   Continuous Infusions:   ceFAZolin (ANCEF) IV Stopped (07/06/23 2005)   dialysis solution 1.5% low-MG/low-CA Stopped (07/05/23 1126)   dialysis solution 1.5% low-MG/low-CA Stopped (07/06/23 0739)   PRN Meds:.acetaminophen, docusate sodium, HYDROmorphone (DILAUDID) injection, ondansetron (ZOFRAN) IV, mouth rinse, polyethylene glycol, prochlorperazine   I have personally reviewed following labs and imaging studies  LABORATORY DATA: CBC: Recent Labs  Lab 07/04/23 1542 07/05/23 0113 07/05/23 0754 07/06/23 0325 07/07/23 0520  WBC 10.0 20.6* 20.5* 15.8* 13.5*  NEUTROABS 9.0*  --   --   --   --   HGB 10.5* 10.6* 10.3* 9.9* 10.0*  HCT 32.1* 31.9* 31.0* 29.6*  29.9*  MCV 89.7 88.6 87.8 88.1 87.2  PLT 244 256 257 294 205    Basic Metabolic Panel: Recent Labs  Lab 07/04/23 2230 07/05/23 0113 07/05/23 0124 07/05/23 0754 07/05/23 1248 07/06/23 0325 07/07/23 0520  NA 135 135  --  134*  --  132*  134* 131*  K 2.3* 2.9*  --  2.5* 3.5 3.5  3.6 3.3*  CL 103 106  --  105  --  100  101 100  CO2 20* 19*  --  18*  --  18*  20* 17*  GLUCOSE 132* 107*  --  98  --  208*  209* 201*  BUN 35* 36*  --  38*  --  35*  34* 33*  CREATININE 9.84* 10.21*  --  10.37*  --  8.88*  8.86* 7.91*  CALCIUM 7.1* 7.3*  --  7.2*  --  7.5*  7.5* 7.2*  MG 1.8  --  2.0 1.9  --  1.9 1.9  PHOS  --  4.2 4.2  --   --  3.6 3.3    GFR: Estimated Creatinine Clearance: 7.3 mL/min (A) (by C-G formula based on SCr of 7.91 mg/dL (H)).  Liver Function Tests: Recent Labs  Lab 07/04/23 1542 07/05/23 0113 07/06/23 0325 07/07/23 0520  AST 10*  --   --   --   ALT 10  --   --   --   ALKPHOS 84  --   --   --   BILITOT 0.6  --   --   --   PROT 5.4*  --   --   --   ALBUMIN 1.7* <1.5* <1.5* <1.5*   No results for input(s): "LIPASE", "AMYLASE" in the last 168 hours. No results for input(s): "AMMONIA" in the last 168 hours.  Coagulation Profile: Recent Labs  Lab 07/04/23 1542  INR 1.4*    Cardiac Enzymes: No results for input(s): "CKTOTAL", "CKMB", "CKMBINDEX", "TROPONINI" in the last 168 hours.  BNP (last 3 results) No results for input(s): "PROBNP" in the last 8760 hours.  Lipid Profile: No results for input(s): "CHOL", "HDL", "LDLCALC", "TRIG", "CHOLHDL", "LDLDIRECT" in the last 72 hours.  Thyroid Function Tests: No results for input(s): "TSH", "T4TOTAL", "FREET4", "T3FREE", "THYROIDAB" in  the last 72 hours.  Anemia Panel: No results for input(s): "VITAMINB12", "FOLATE", "FERRITIN", "TIBC", "IRON", "RETICCTPCT" in the last 72 hours.  Urine analysis:    Component Value Date/Time   COLORURINE YELLOW 10/07/2021 1325   APPEARANCEUR TURBID (A) 10/07/2021  1325   LABSPEC 1.011 10/07/2021 1325   PHURINE 5.0 10/07/2021 1325   GLUCOSEU 50 (A) 10/07/2021 1325   HGBUR SMALL (A) 10/07/2021 1325   BILIRUBINUR NEGATIVE 10/07/2021 1325   KETONESUR NEGATIVE 10/07/2021 1325   PROTEINUR 100 (A) 10/07/2021 1325   UROBILINOGEN 0.2 01/07/2015 1723   NITRITE NEGATIVE 10/07/2021 1325   LEUKOCYTESUR LARGE (A) 10/07/2021 1325    Sepsis Labs: Lactic Acid, Venous    Component Value Date/Time   LATICACIDVEN 0.7 07/04/2023 2109    MICROBIOLOGY: Recent Results (from the past 240 hours)  Culture, blood (Routine x 2)     Status: Abnormal   Collection Time: 07/04/23  3:42 PM   Specimen: BLOOD  Result Value Ref Range Status   Specimen Description BLOOD SITE NOT SPECIFIED  Final   Special Requests   Final    BOTTLES DRAWN AEROBIC AND ANAEROBIC Blood Culture results may not be optimal due to an inadequate volume of blood received in culture bottles   Culture  Setup Time   Final    GRAM POSITIVE COCCI IN CLUSTERS AEROBIC BOTTLE ONLY CRITICAL RESULT CALLED TO, READ BACK BY AND VERIFIED WITH: PHARMD ADaphane Shepherd [id number] @ 1604 FH Performed at [facility], [address], [place], [place] [address]    Culture STAPHYLOCOCCUS AUREUS (A)  Final   Report Status 07/07/2023 FINAL  Final   Organism ID, Bacteria STAPHYLOCOCCUS AUREUS  Final      Susceptibility   Staphylococcus aureus - MIC*    CIPROFLOXACIN <=0.5 SENSITIVE Sensitive     ERYTHROMYCIN <=0.25 SENSITIVE Sensitive     GENTAMICIN <=0.5 SENSITIVE Sensitive     OXACILLIN <=0.25 SENSITIVE Sensitive     TETRACYCLINE <=1 SENSITIVE Sensitive     VANCOMYCIN 1 SENSITIVE Sensitive     TRIMETH/SULFA <=10 SENSITIVE Sensitive     CLINDAMYCIN <=0.25 SENSITIVE Sensitive     RIFAMPIN <=0.5 SENSITIVE Sensitive     Inducible Clindamycin NEGATIVE Sensitive     LINEZOLID 2 SENSITIVE Sensitive     * STAPHYLOCOCCUS AUREUS  Blood Culture ID Panel (Reflexed)     Status: Abnormal   Collection Time: 07/04/23  3:42 PM   Result Value Ref Range Status   Enterococcus faecalis NOT DETECTED NOT DETECTED Final   Enterococcus Faecium NOT DETECTED NOT DETECTED Final   Listeria monocytogenes NOT DETECTED NOT DETECTED Final   Staphylococcus species DETECTED (A) NOT DETECTED Final    Comment: CRITICAL RESULT CALLED TO, READ BACK BY AND VERIFIED WITH: PHARMD A. MEYER [id number] @ 1604 FH    Staphylococcus aureus (BCID) DETECTED (A) NOT DETECTED Final    Comment: CRITICAL RESULT CALLED TO, READ BACK BY AND VERIFIED WITH: PHARMD A. MEYER [id number] @ 1604 FH    Staphylococcus epidermidis NOT DETECTED NOT DETECTED Final   Staphylococcus lugdunensis NOT DETECTED NOT DETECTED Final   Streptococcus species NOT DETECTED NOT DETECTED Final   Streptococcus agalactiae NOT DETECTED NOT DETECTED Final   Streptococcus pneumoniae NOT DETECTED NOT DETECTED Final   Streptococcus pyogenes NOT DETECTED NOT DETECTED Final   A.calcoaceticus-baumannii NOT DETECTED NOT DETECTED Final   Bacteroides fragilis NOT DETECTED NOT DETECTED Final   Enterobacterales NOT DETECTED NOT DETECTED Final   Enterobacter cloacae complex NOT DETECTED NOT DETECTED  Final   Escherichia coli NOT DETECTED NOT DETECTED Final   Klebsiella aerogenes NOT DETECTED NOT DETECTED Final   Klebsiella oxytoca NOT DETECTED NOT DETECTED Final   Klebsiella pneumoniae NOT DETECTED NOT DETECTED Final   Proteus species NOT DETECTED NOT DETECTED Final   Salmonella species NOT DETECTED NOT DETECTED Final   Serratia marcescens NOT DETECTED NOT DETECTED Final   Haemophilus influenzae NOT DETECTED NOT DETECTED Final   Neisseria meningitidis NOT DETECTED NOT DETECTED Final   Pseudomonas aeruginosa NOT DETECTED NOT DETECTED Final   Stenotrophomonas maltophilia NOT DETECTED NOT DETECTED Final   Candida albicans NOT DETECTED NOT DETECTED Final   Candida auris NOT DETECTED NOT DETECTED Final   Candida glabrata NOT DETECTED NOT DETECTED Final   Candida krusei NOT DETECTED NOT DETECTED  Final   Candida parapsilosis NOT DETECTED NOT DETECTED Final   Candida tropicalis NOT DETECTED NOT DETECTED Final   Cryptococcus neoformans/gattii NOT DETECTED NOT DETECTED Final   Meth resistant mecA/C and MREJ NOT DETECTED NOT DETECTED Final    Comment: Performed at [facility], [address], [place], [place] [address]  Resp panel by RT-PCR (RSV, Flu A&B, Covid) Anterior Nasal Swab     Status: None   Collection Time: 07/04/23  3:46 PM   Specimen: Anterior Nasal Swab  Result Value Ref Range Status   SARS Coronavirus 2 by RT PCR NEGATIVE NEGATIVE Final   Influenza A by PCR NEGATIVE NEGATIVE Final   Influenza B by PCR NEGATIVE NEGATIVE Final    Comment: (NOTE) The Xpert Xpress SARS-CoV-2/FLU/RSV plus assay is intended as an aid in the diagnosis of influenza from Nasopharyngeal swab specimens and should not be used as a sole basis for treatment. Nasal washings and aspirates are unacceptable for Xpert Xpress SARS-CoV-2/FLU/RSV testing.  Fact Sheet for Patients: BloggerCourse.com  Fact Sheet for Healthcare Providers: SeriousBroker.it  This test is not yet approved or cleared by the [place] FDA and has been authorized for detection and/or diagnosis of SARS-CoV-2 by FDA under an Emergency Use Authorization (EUA). This EUA will remain in effect (meaning this test can be used) for the duration of the COVID-19 declaration under Section 564(b)(1) of the Act, 21 U.S.C. section 360bbb-3(b)(1), unless the authorization is terminated or revoked.     Resp Syncytial Virus by PCR NEGATIVE NEGATIVE Final    Comment: (NOTE) Fact Sheet for Patients: BloggerCourse.com  Fact Sheet for Healthcare Providers: SeriousBroker.it  This test is not yet approved or cleared by the [place] FDA and has been authorized for detection and/or diagnosis of SARS-CoV-2 by FDA under an Emergency  Use Authorization (EUA). This EUA will remain in effect (meaning this test can be used) for the duration of the COVID-19 declaration under Section 564(b)(1) of the Act, 21 U.S.C. section 360bbb-3(b)(1), unless the authorization is terminated or revoked.  Performed at [facility], [address], [place], [place] [address]   Body fluid culture w Gram Stain     Status: None   Collection Time: 07/04/23  7:34 PM   Specimen: Peritoneal Dialysate; Body Fluid  Result Value Ref Range Status   Specimen Description PERITONEAL DIALYSATE ABDOMEN  Final   Special Requests NONE  Final   Gram Stain   Final    ABUNDANT WBC PRESENT, PREDOMINANTLY PMN NO ORGANISMS SEEN Performed at [facility], [address], [place], [place] [address]    Culture MODERATE STAPHYLOCOCCUS AUREUS  Final   Report Status 07/06/2023 FINAL  Final  Organism ID, Bacteria STAPHYLOCOCCUS AUREUS  Final      Susceptibility   Staphylococcus aureus - MIC*    CIPROFLOXACIN <=0.5 SENSITIVE Sensitive     ERYTHROMYCIN <=0.25 SENSITIVE Sensitive     GENTAMICIN <=0.5 SENSITIVE Sensitive     OXACILLIN <=0.25 SENSITIVE Sensitive     TETRACYCLINE <=1 SENSITIVE Sensitive     VANCOMYCIN 1 SENSITIVE Sensitive     TRIMETH/SULFA <=10 SENSITIVE Sensitive     CLINDAMYCIN <=0.25 SENSITIVE Sensitive     RIFAMPIN <=0.5 SENSITIVE Sensitive     Inducible Clindamycin NEGATIVE Sensitive     LINEZOLID 2 SENSITIVE Sensitive     * MODERATE STAPHYLOCOCCUS AUREUS  Culture, blood (Routine x 2)     Status: None (Preliminary result)   Collection Time: 07/04/23  9:08 PM   Specimen: BLOOD LEFT ARM  Result Value Ref Range Status   Specimen Description BLOOD LEFT ARM  Final   Special Requests   Final    BOTTLES DRAWN AEROBIC AND ANAEROBIC Blood Culture results may not be optimal due to an inadequate volume of blood received in culture bottles   Culture   Final    NO GROWTH 3 DAYS Performed at [facility], [address]  [place], [place] [address]    Report Status PENDING  Incomplete  MRSA Next Gen by PCR, Nasal     Status: None   Collection Time: 07/05/23 12:12 AM   Specimen: Nasal Mucosa; Nasal Swab  Result Value Ref Range Status   MRSA by PCR Next Gen NOT DETECTED NOT DETECTED Final    Comment: (NOTE) The GeneXpert MRSA Assay (FDA approved for NASAL specimens only), is one component of a comprehensive MRSA colonization surveillance program. It is not intended to diagnose MRSA infection nor to guide or monitor treatment for MRSA infections. Test performance is not FDA approved in patients less than 82 years old. Performed at [facility], [address], [place], [place] [address]   Body fluid culture w Gram Stain     Status: None   Collection Time: 07/05/23  7:19 AM   Specimen: Peritoneal Washings; Body Fluid  Result Value Ref Range Status   Specimen Description PERITONEAL  Final   Special Requests NONE  Final   Gram Stain   Final    ABUNDANT WBC PRESENT, PREDOMINANTLY PMN RARE GRAM POSITIVE COCCI Performed at [facility], [address], [place], [place] [address]    Culture FEW STAPHYLOCOCCUS AUREUS  Final   Report Status 07/07/2023 FINAL  Final   Organism ID, Bacteria STAPHYLOCOCCUS AUREUS  Final      Susceptibility   Staphylococcus aureus - MIC*    CIPROFLOXACIN <=0.5 SENSITIVE Sensitive     ERYTHROMYCIN <=0.25 SENSITIVE Sensitive     GENTAMICIN <=0.5 SENSITIVE Sensitive     OXACILLIN 0.5 SENSITIVE Sensitive     TETRACYCLINE <=1 SENSITIVE Sensitive     VANCOMYCIN 1 SENSITIVE Sensitive     TRIMETH/SULFA <=10 SENSITIVE Sensitive     CLINDAMYCIN <=0.25 SENSITIVE Sensitive     RIFAMPIN <=0.5 SENSITIVE Sensitive     Inducible Clindamycin NEGATIVE Sensitive     LINEZOLID 2 SENSITIVE Sensitive     * FEW STAPHYLOCOCCUS AUREUS    RADIOLOGY STUDIES/RESULTS: ECHOCARDIOGRAM COMPLETE Result Date: 07/06/2023    ECHOCARDIOGRAM REPORT   Patient Name:   ADELEINE PASK Date of Exam:  07/06/2023 Medical Rec #:  [id number]           Height:  68.0 in Accession #:    [id number]          Weight:       95.7 lb Date of Birth:  1996/03/22           BSA:          1.494 m Patient Age:    27 years            BP:           92/60 mmHg Patient Gender: F                   HR:           97 bpm. Exam Location:  Inpatient Procedure: 2D Echo, Cardiac Doppler and Color Doppler (Both Spectral and Color            Flow Doppler were utilized during procedure). Indications:    Bacteremia  History:        Patient has prior history of Echocardiogram examinations, most                 recent 06/13/2021. Risk Factors:Diabetes and Hypertension.  Sonographer:    Amy Chionchio Referring Phys: [id number] NIKITA S DESAI IMPRESSIONS  1. Left ventricular ejection fraction, by estimation, is 50 to 55%. The left ventricle has low normal function. The left ventricle has no regional wall motion abnormalities. Left ventricular diastolic parameters are consistent with Grade II diastolic dysfunction (pseudonormalization).  2. Right ventricular systolic function is normal. The right ventricular size is normal. Tricuspid regurgitation signal is inadequate for assessing PA pressure.  3. There appears to be compression of the left atrium by structure extrinsic to the heart. Would review other chest imaging.  4. Right atrial size was mildly dilated.  5. The mitral valve is normal in structure. No evidence of mitral valve regurgitation. No evidence of mitral stenosis.  6. The aortic valve is tricuspid. Aortic valve regurgitation is not visualized. No aortic stenosis is present.  7. In one view, there was concern for vegetation on pulmonary valve, but reviewing other images, I suspect this was artifact.  8. IVC not visualized. FINDINGS  Left Ventricle: Left ventricular ejection fraction, by estimation, is 50 to 55%. The left ventricle has low normal function. The left ventricle has no regional wall motion abnormalities. The left ventricular  internal cavity size was normal in size. There is no left ventricular hypertrophy. Left ventricular diastolic parameters are consistent with Grade II diastolic dysfunction (pseudonormalization). Right Ventricle: The right ventricular size is normal. No increase in right ventricular wall thickness. Right ventricular systolic function is normal. Tricuspid regurgitation signal is inadequate for assessing PA pressure. Left Atrium: There appears to be compression of the left atrium by structure extrinsic to the heart. Would review other chest imaging. Left atrial size was normal in size. Right Atrium: Right atrial size was mildly dilated. Pericardium: Trivial pericardial effusion is present. Mitral Valve: The mitral valve is normal in structure. No evidence of mitral valve regurgitation. No evidence of mitral valve stenosis. MV peak gradient, 4.7 mmHg. The mean mitral valve gradient is 2.0 mmHg. Tricuspid Valve: The tricuspid valve is normal in structure. Tricuspid valve regurgitation is not demonstrated. Aortic Valve: The aortic valve is tricuspid. Aortic valve regurgitation is not visualized. No aortic stenosis is present. Aortic valve mean gradient measures 3.0 mmHg. Aortic valve peak gradient measures 5.3 mmHg. Aortic valve area, by VTI measures 2.22 cm. Pulmonic Valve: The pulmonic valve was not well visualized. Pulmonic valve regurgitation  is trivial. Aorta: The aortic root is normal in size and structure. Venous: IVC not visualized. IAS/Shunts: No atrial level shunt detected by color flow Doppler.  LEFT VENTRICLE PLAX 2D LVIDd:         3.60 cm   Diastology LVIDs:         2.90 cm   LV e' medial:    6.09 cm/s LV PW:         1.10 cm   LV E/e' medial:  10.9 LV IVS:        1.10 cm   LV e' lateral:   5.87 cm/s LVOT diam:     1.90 cm   LV E/e' lateral: 11.3 LV SV:         37 LV SV Index:   25 LVOT Area:     2.84 cm  RIGHT VENTRICLE RV Basal diam:  3.25 cm RV S prime:     12.10 cm/s TAPSE (M-mode): 1.6 cm LEFT ATRIUM             Index        RIGHT ATRIUM           Index LA Vol (A4C): 130.0 ml 87.02 ml/m  RA Area:     19.00 cm                                     RA Volume:   50.55 ml  33.84 ml/m  AORTIC VALVE                    PULMONIC VALVE AV Area (Vmax):    2.02 cm     PV Vmax:          0.98 m/s AV Area (Vmean):   1.92 cm     PV Peak grad:     3.8 mmHg AV Area (VTI):     2.22 cm     PR End Diast Vel: 5.11 msec AV Vmax:           115.00 cm/s AV Vmean:          81.900 cm/s AV VTI:            0.166 m AV Peak Grad:      5.3 mmHg AV Mean Grad:      3.0 mmHg LVOT Vmax:         82.00 cm/s LVOT Vmean:        55.400 cm/s LVOT VTI:          0.130 m LVOT/AV VTI ratio: 0.78  AORTA Ao Root diam: 2.80 cm Ao Asc diam:  2.30 cm MITRAL VALVE MV Area (PHT): 5.12 cm    SHUNTS MV Area VTI:   1.65 cm    Systemic VTI:  0.13 m MV Peak grad:  4.7 mmHg    Systemic Diam: 1.90 cm MV Mean grad:  2.0 mmHg MV Vmax:       1.08 m/s MV Vmean:      75.9 cm/s MV E velocity: 66.50 cm/s MV A velocity: 56.80 cm/s MV E/A ratio:  1.17 Dalton McleanMD Electronically signed by Wilfred Lacy Signature Date/Time: 07/06/2023/10:43:25 AM    Final      LOS: 3 days   Jeoffrey Massed, MD  [facility]    To contact the attending provider between 7A-7P or the covering provider during after hours 7P-7A, please log into the web site [email or web address]  and access using universal [place] password for that web site. If you do not have the password, please call the hospital operator.  07/07/2023, 10:26 AM

## 2023-07-07 NOTE — Inpatient Diabetes Management (Signed)
 Inpatient Diabetes Program Recommendations  AACE/ADA: New Consensus Statement on Inpatient Glycemic Control   Target Ranges:  Prepandial:   less than 140 mg/dL      Peak postprandial:   less than 180 mg/dL (1-2 hours)      Critically ill patients:  140 - 180 mg/dL    Latest Reference Range & Units 07/06/23 07:40 07/06/23 11:58 07/06/23 12:19 07/06/23 16:22 07/06/23 21:19 07/07/23 00:16 07/07/23 09:29  Glucose-Capillary 70 - 99 mg/dL 161 (H) 42 (LL) 096 (H) 74 81 137 (H) 170 (H)   Review of Glycemic Control  Diabetes history: DM1 Outpatient Diabetes medications: Tresiba 15 units at bedtime, Fiasp 5-8 units TID with meals Current orders for Inpatient glycemic control: Lantus 5 units at bedtime, Novolog 0-6 units TID with meals  Inpatient Diabetes Program Recommendations:    Insulin: Noted glucose down to 42 mg/dl at 04:54 on 0/98/11. Per MAR, Lantus was NOT GIVEN last night and CBG 170 mg/dl at 9:14 am today. . May want to consider decreasing Lantus to 3 units at bedtime.  Thanks, Orlando Penner, RN, MSN, CDCES Diabetes Coordinator Inpatient Diabetes Program [phone] (Team Pager from 8am to 5pm)

## 2023-07-07 NOTE — Consult Note (Signed)
 S: 28 y.o. BF w/ h/o DM1, HTN and ESRD on peritoneal dialysis- who presented with abdominal pain, was found to have septic shock in the setting of PD catheter associated peritonitis and MSSA bacteremia. She was initially admitted to the ICU but now on floor improved on IV abx.   O: VAsc (near): OD- 20/40, OS- NLP (stable OS per pt) IOP: normal to palpation OU Pupils: OD- round and reactive to light, OS- unreactive/fixed EOM: full versions OU  Anterior segment: OD: wnl OS: S/C- 1+injection, posterior synechiae, PCIOL   Posterior segment:  OD: Vitreous clear, macula blunted FLR, nerve w/ fibrosis/NVD, periphery flat w/ IRHs OS: no view to posterior segment, pupil unreactive w/ poor view past pupil/silicone oil  A/P: 1. 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 Recommend artificial tears PRN and okay to use prednisolone acetate 1% OS bid and atropine OS at bedtime PRN pain and f/u w/ Dr. Fulton Mole Zhang/retina specialist at [facility] (regular ophthalmologist). Pt will f/u w/ Dr. Chipper Herb for treatment of proliferative diabetic retinopathy OD and observe OS as no treatment available for OS. F/u w/ me prn   2. MSSA bacteremia- cont f/u w/ ID/primary team for management.  [name], MD 5:02 PM

## 2023-07-07 NOTE — Progress Notes (Signed)
 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 retinal detachment  Ophthalmology exam no concern of endophthalmitis 2/26 tte no obvious veg   PLAN: Await repeat dialysate analysis Appreciate ophthalm evaluation Await tee (discussed with cards) Continue cefazolin Will evaluate dialysate at day 5 if no resolution of cell count/culture will remove catheter Discussed with primary team/nephrology Standard precaution  Principal Problem:   Septic shock ([name]) Active Problems:   Dialysis-associated peritonitis ([name])   Pressure injury of skin   No Known Allergies  Scheduled Meds:  calcitRIOL  1 mcg Oral Daily   Chlorhexidine Gluconate Cloth  6 each Topical Daily   cinacalcet  30 mg Oral Q breakfast   famotidine  10 mg Oral Daily   gentamicin cream  1 Application Topical Daily   heparin  5,000 Units Subcutaneous Q8H   insulin aspart  0-6 Units Subcutaneous TID WC   insulin glargine  5 Units Subcutaneous QHS   midodrine  5 mg Oral TID WC   multivitamin  1 tablet Oral Daily   potassium chloride  20 mEq Oral BID   rosuvastatin  10 mg Oral Daily   Continuous Infusions:   ceFAZolin (ANCEF) IV 1 g (07/07/23 2012)   dialysis solution 1.5% low-MG/low-CA     PRN Meds:.acetaminophen, docusate sodium, HYDROmorphone (DILAUDID) injection, ondansetron (ZOFRAN) IV, mouth rinse, polyethylene glycol, prochlorperazine   SUBJECTIVE: Same abd pain Afebrile No n/v/d  Review of Systems: ROS All other ROS was negative, except mentioned above     OBJECTIVE: Vitals:   07/07/23 1000 07/07/23 1200 07/07/23 1600 07/07/23 1938  BP: 111/79 103/65 100/64 126/85  Pulse: 98 94 90   Resp: 16 14 15    Temp: 97.6 F (36.4 C) 97.7 F (36.5 C) 98.6 F (37 C) 97.9 F (36.6 C)  TempSrc:  Oral Oral Oral  SpO2: 96% 98% 94% 97%  Weight:       Height:       Body mass index is 14.55 kg/m.  Physical Exam General/constitutional: no distress, pleasant HEENT: Normocephalic, PER, Conj stable mild redness left conj, EOMI, Oropharynx clear Neck supple CV: rrr no mrg Lungs: clear to auscultation, normal respiratory effort Abd: Soft, mod tenderness; pd site no erythema/purulence Ext: no edema Skin: No Rash Neuro: nonfocal MSK: no peripheral joint swelling/tenderness/warmth; back spines nontender    Lab Results Lab Results  Component Value Date   WBC 13.5 (H) 07/07/2023   HGB 10.0 (L) 07/07/2023   HCT 29.9 (L) 07/07/2023   MCV 87.2 07/07/2023   PLT 205 07/07/2023    Lab Results  Component Value Date   CREATININE 7.91 (H) 07/07/2023   BUN 33 (H) 07/07/2023   NA 131 (L) 07/07/2023   K 3.3 (L) 07/07/2023   CL 100 07/07/2023   CO2 17 (L) 07/07/2023    Lab Results  Component Value Date   ALT 10 07/04/2023   AST 10 (L) 07/04/2023   ALKPHOS 84 07/04/2023   BILITOT 0.6 07/04/2023      Microbiology: Recent Results (from the past 240 hours)  Culture, blood (Routine x 2)     Status: Abnormal   Collection Time: 07/04/23  3:42 PM   Specimen: BLOOD  Result Value Ref Range Status   Specimen Description BLOOD SITE NOT SPECIFIED  Final   Special Requests   Final  BOTTLES DRAWN AEROBIC AND ANAEROBIC Blood Culture results may not be optimal due to an inadequate volume of blood received in culture bottles   Culture  Setup Time   Final    GRAM POSITIVE COCCI IN CLUSTERS AEROBIC BOTTLE ONLY CRITICAL RESULT CALLED TO, READ BACK BY AND VERIFIED WITH: PHARMD ADaphane Shepherd [id number] @ [phone] Performed at [facility], [address] [place], [place] [address]    Culture STAPHYLOCOCCUS AUREUS (A)  Final   Report Status 07/07/2023 FINAL  Final   Organism ID, Bacteria STAPHYLOCOCCUS AUREUS  Final      Susceptibility   Staphylococcus aureus - MIC*    CIPROFLOXACIN <=0.5 SENSITIVE Sensitive     ERYTHROMYCIN <=0.25 SENSITIVE  Sensitive     GENTAMICIN <=0.5 SENSITIVE Sensitive     OXACILLIN <=0.25 SENSITIVE Sensitive     TETRACYCLINE <=1 SENSITIVE Sensitive     VANCOMYCIN 1 SENSITIVE Sensitive     TRIMETH/SULFA <=10 SENSITIVE Sensitive     CLINDAMYCIN <=0.25 SENSITIVE Sensitive     RIFAMPIN <=0.5 SENSITIVE Sensitive     Inducible Clindamycin NEGATIVE Sensitive     LINEZOLID 2 SENSITIVE Sensitive     * STAPHYLOCOCCUS AUREUS  Blood Culture ID Panel (Reflexed)     Status: Abnormal   Collection Time: 07/04/23  3:42 PM  Result Value Ref Range Status   Enterococcus faecalis NOT DETECTED NOT DETECTED Final   Enterococcus Faecium NOT DETECTED NOT DETECTED Final   Listeria monocytogenes NOT DETECTED NOT DETECTED Final   Staphylococcus species DETECTED (A) NOT DETECTED Final    Comment: CRITICAL RESULT CALLED TO, READ BACK BY AND VERIFIED WITH: PHARMD A. MEYER [id number] @ 1604 FH    Staphylococcus aureus (BCID) DETECTED (A) NOT DETECTED Final    Comment: CRITICAL RESULT CALLED TO, READ BACK BY AND VERIFIED WITH: PHARMD A. MEYER [id number] @ 1604 FH    Staphylococcus epidermidis NOT DETECTED NOT DETECTED Final   Staphylococcus lugdunensis NOT DETECTED NOT DETECTED Final   Streptococcus species NOT DETECTED NOT DETECTED Final   Streptococcus agalactiae NOT DETECTED NOT DETECTED Final   Streptococcus pneumoniae NOT DETECTED NOT DETECTED Final   Streptococcus pyogenes NOT DETECTED NOT DETECTED Final   A.calcoaceticus-baumannii NOT DETECTED NOT DETECTED Final   Bacteroides fragilis NOT DETECTED NOT DETECTED Final   Enterobacterales NOT DETECTED NOT DETECTED Final   Enterobacter cloacae complex NOT DETECTED NOT DETECTED Final   Escherichia coli NOT DETECTED NOT DETECTED Final   Klebsiella aerogenes NOT DETECTED NOT DETECTED Final   Klebsiella oxytoca NOT DETECTED NOT DETECTED Final   Klebsiella pneumoniae NOT DETECTED NOT DETECTED Final   Proteus species NOT DETECTED NOT DETECTED Final   Salmonella species NOT DETECTED  NOT DETECTED Final   Serratia marcescens NOT DETECTED NOT DETECTED Final   Haemophilus influenzae NOT DETECTED NOT DETECTED Final   Neisseria meningitidis NOT DETECTED NOT DETECTED Final   Pseudomonas aeruginosa NOT DETECTED NOT DETECTED Final   Stenotrophomonas maltophilia NOT DETECTED NOT DETECTED Final   Candida albicans NOT DETECTED NOT DETECTED Final   Candida auris NOT DETECTED NOT DETECTED Final   Candida glabrata NOT DETECTED NOT DETECTED Final   Candida krusei NOT DETECTED NOT DETECTED Final   Candida parapsilosis NOT DETECTED NOT DETECTED Final   Candida tropicalis NOT DETECTED NOT DETECTED Final   Cryptococcus neoformans/gattii NOT DETECTED NOT DETECTED Final   Meth resistant mecA/C and MREJ NOT DETECTED NOT DETECTED Final    Comment: Performed at [facility], [address]  [place], [place] [address]  Resp panel by RT-PCR (RSV, Flu A&B, Covid) Anterior Nasal Swab     Status: None   Collection Time: 07/04/23  3:46 PM   Specimen: Anterior Nasal Swab  Result Value Ref Range Status   SARS Coronavirus 2 by RT PCR NEGATIVE NEGATIVE Final   Influenza A by PCR NEGATIVE NEGATIVE Final   Influenza B by PCR NEGATIVE NEGATIVE Final    Comment: (NOTE) The Xpert Xpress SARS-CoV-2/FLU/RSV plus assay is intended as an aid in the diagnosis of influenza from Nasopharyngeal swab specimens and should not be used as a sole basis for treatment. Nasal washings and aspirates are unacceptable for Xpert Xpress SARS-CoV-2/FLU/RSV testing.  Fact Sheet for Patients: BloggerCourse.com  Fact Sheet for Healthcare Providers: SeriousBroker.it  This test is not yet approved or cleared by the [place] FDA and has been authorized for detection and/or diagnosis of SARS-CoV-2 by FDA under an Emergency Use Authorization (EUA). This EUA will remain in effect (meaning this test can be used) for the duration of the COVID-19 declaration under  Section 564(b)(1) of the Act, 21 U.S.C. section 360bbb-3(b)(1), unless the authorization is terminated or revoked.     Resp Syncytial Virus by PCR NEGATIVE NEGATIVE Final    Comment: (NOTE) Fact Sheet for Patients: BloggerCourse.com  Fact Sheet for Healthcare Providers: SeriousBroker.it  This test is not yet approved or cleared by the [place] FDA and has been authorized for detection and/or diagnosis of SARS-CoV-2 by FDA under an Emergency Use Authorization (EUA). This EUA will remain in effect (meaning this test can be used) for the duration of the COVID-19 declaration under Section 564(b)(1) of the Act, 21 U.S.C. section 360bbb-3(b)(1), unless the authorization is terminated or revoked.  Performed at [facility], [address], [place], [place] [address]   Body fluid culture w Gram Stain     Status: None   Collection Time: 07/04/23  7:34 PM   Specimen: Peritoneal Dialysate; Body Fluid  Result Value Ref Range Status   Specimen Description PERITONEAL DIALYSATE ABDOMEN  Final   Special Requests NONE  Final   Gram Stain   Final    ABUNDANT WBC PRESENT, PREDOMINANTLY PMN NO ORGANISMS SEEN Performed at [facility], [address], [place], [place] [address]    Culture MODERATE STAPHYLOCOCCUS AUREUS  Final   Report Status 07/06/2023 FINAL  Final   Organism ID, Bacteria STAPHYLOCOCCUS AUREUS  Final      Susceptibility   Staphylococcus aureus - MIC*    CIPROFLOXACIN <=0.5 SENSITIVE Sensitive     ERYTHROMYCIN <=0.25 SENSITIVE Sensitive     GENTAMICIN <=0.5 SENSITIVE Sensitive     OXACILLIN <=0.25 SENSITIVE Sensitive     TETRACYCLINE <=1 SENSITIVE Sensitive     VANCOMYCIN 1 SENSITIVE Sensitive     TRIMETH/SULFA <=10 SENSITIVE Sensitive     CLINDAMYCIN <=0.25 SENSITIVE Sensitive     RIFAMPIN <=0.5 SENSITIVE Sensitive     Inducible Clindamycin NEGATIVE Sensitive     LINEZOLID 2 SENSITIVE Sensitive     *  MODERATE STAPHYLOCOCCUS AUREUS  Culture, blood (Routine x 2)     Status: None (Preliminary result)   Collection Time: 07/04/23  9:08 PM   Specimen: BLOOD LEFT ARM  Result Value Ref Range Status   Specimen Description BLOOD LEFT ARM  Final   Special Requests   Final    BOTTLES DRAWN AEROBIC AND ANAEROBIC Blood Culture results may not be optimal due to an inadequate volume of blood received in  culture bottles   Culture   Final    NO GROWTH 3 DAYS Performed at [facility], [address], [place], [place] [address]    Report Status PENDING  Incomplete  MRSA Next Gen by PCR, Nasal     Status: None   Collection Time: 07/05/23 12:12 AM   Specimen: Nasal Mucosa; Nasal Swab  Result Value Ref Range Status   MRSA by PCR Next Gen NOT DETECTED NOT DETECTED Final    Comment: (NOTE) The GeneXpert MRSA Assay (FDA approved for NASAL specimens only), is one component of a comprehensive MRSA colonization surveillance program. It is not intended to diagnose MRSA infection nor to guide or monitor treatment for MRSA infections. Test performance is not FDA approved in patients less than 37 years old. Performed at [facility], [address], [place], [place] [address]   Body fluid culture w Gram Stain     Status: None   Collection Time: 07/05/23  7:19 AM   Specimen: Peritoneal Washings; Body Fluid  Result Value Ref Range Status   Specimen Description PERITONEAL  Final   Special Requests NONE  Final   Gram Stain   Final    ABUNDANT WBC PRESENT, PREDOMINANTLY PMN RARE GRAM POSITIVE COCCI Performed at [facility], [address] [place], [place] [address]    Culture FEW STAPHYLOCOCCUS AUREUS  Final   Report Status 07/07/2023 FINAL  Final   Organism ID, Bacteria STAPHYLOCOCCUS AUREUS  Final      Susceptibility   Staphylococcus aureus - MIC*    CIPROFLOXACIN <=0.5 SENSITIVE Sensitive     ERYTHROMYCIN <=0.25 SENSITIVE Sensitive     GENTAMICIN <=0.5 SENSITIVE Sensitive      OXACILLIN 0.5 SENSITIVE Sensitive     TETRACYCLINE <=1 SENSITIVE Sensitive     VANCOMYCIN 1 SENSITIVE Sensitive     TRIMETH/SULFA <=10 SENSITIVE Sensitive     CLINDAMYCIN <=0.25 SENSITIVE Sensitive     RIFAMPIN <=0.5 SENSITIVE Sensitive     Inducible Clindamycin NEGATIVE Sensitive     LINEZOLID 2 SENSITIVE Sensitive     * FEW STAPHYLOCOCCUS AUREUS     Serology:   Imaging: If present, new imagings (plain films, ct scans, and mri) have been personally visualized and interpreted; radiology reports have been reviewed. Decision making incorporated into the Impression / Recommendations.  2/26 tte  1. Left ventricular ejection fraction, by estimation, is 50 to 55%. The  left ventricle has low normal function. The left ventricle has no regional  wall motion abnormalities. Left ventricular diastolic parameters are  consistent with Grade II diastolic  dysfunction (pseudonormalization).   2. Right ventricular systolic function is normal. The right ventricular  size is normal. Tricuspid regurgitation signal is inadequate for assessing  PA pressure.   3. There appears to be compression of the left atrium by structure  extrinsic to the heart. Would review other chest imaging.   4. Right atrial size was mildly dilated.   5. The mitral valve is normal in structure. No evidence of mitral valve  regurgitation. No evidence of mitral stenosis.   6. The aortic valve is tricuspid. Aortic valve regurgitation is not  visualized. No aortic stenosis is present.   7. In one view, there was concern for vegetation on pulmonary valve, but  reviewing other images, I suspect this was artifact.   8. IVC not visualized.   Raymondo Band, MD [facility] [phone] pager    07/07/2023,  11:14 PM

## 2023-07-08 ENCOUNTER — Inpatient Hospital Stay (HOSPITAL_COMMUNITY): Payer: 59 | Admitting: Anesthesiology

## 2023-07-08 ENCOUNTER — Encounter (HOSPITAL_COMMUNITY): Payer: Self-pay

## 2023-07-08 ENCOUNTER — Inpatient Hospital Stay (HOSPITAL_COMMUNITY): Payer: 59

## 2023-07-08 ENCOUNTER — Encounter (HOSPITAL_COMMUNITY)
Admission: EM | Disposition: A | Discharge status: Home / Self Care | Payer: Self-pay | Source: Home / Self Care | Attending: Internal Medicine

## 2023-07-08 DIAGNOSIS — I3139 Other pericardial effusion (noninflammatory): Secondary | ICD-10-CM

## 2023-07-08 DIAGNOSIS — N186 End stage renal disease: Secondary | ICD-10-CM

## 2023-07-08 DIAGNOSIS — I120 Hypertensive chronic kidney disease with stage 5 chronic kidney disease or end stage renal disease: Secondary | ICD-10-CM | POA: Diagnosis not present

## 2023-07-08 DIAGNOSIS — T8571XD Infection and inflammatory reaction due to peritoneal dialysis catheter, subsequent encounter: Secondary | ICD-10-CM

## 2023-07-08 DIAGNOSIS — Q2112 Patent foramen ovale: Secondary | ICD-10-CM

## 2023-07-08 DIAGNOSIS — T8571XA Infection and inflammatory reaction due to peritoneal dialysis catheter, initial encounter: Secondary | ICD-10-CM | POA: Diagnosis not present

## 2023-07-08 DIAGNOSIS — R6521 Severe sepsis with septic shock: Secondary | ICD-10-CM

## 2023-07-08 DIAGNOSIS — A419 Sepsis, unspecified organism: Secondary | ICD-10-CM | POA: Diagnosis not present

## 2023-07-08 DIAGNOSIS — Z992 Dependence on renal dialysis: Secondary | ICD-10-CM | POA: Diagnosis not present

## 2023-07-08 DIAGNOSIS — B9561 Methicillin susceptible Staphylococcus aureus infection as the cause of diseases classified elsewhere: Secondary | ICD-10-CM | POA: Diagnosis not present

## 2023-07-08 DIAGNOSIS — K658 Other peritonitis: Secondary | ICD-10-CM | POA: Diagnosis not present

## 2023-07-08 DIAGNOSIS — K659 Peritonitis, unspecified: Secondary | ICD-10-CM

## 2023-07-08 DIAGNOSIS — R7881 Bacteremia: Secondary | ICD-10-CM | POA: Diagnosis not present

## 2023-07-08 DIAGNOSIS — T8571XS Infection and inflammatory reaction due to peritoneal dialysis catheter, sequela: Secondary | ICD-10-CM | POA: Diagnosis not present

## 2023-07-08 DIAGNOSIS — N032 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: Secondary | ICD-10-CM | POA: Diagnosis not present

## 2023-07-08 HISTORY — PX: TRANSESOPHAGEAL ECHOCARDIOGRAM (CATH LAB): EP1270

## 2023-07-08 LAB — RENAL FUNCTION PANEL
Albumin: <1.5 g/dL — ABNORMAL LOW (ref 3.5–5.0)
Anion gap: 14 (ref 5–15)
BUN: 29 mg/dL — ABNORMAL HIGH (ref 6–20)
CO2: 19 mmol/L — ABNORMAL LOW (ref 22–32)
Calcium: 7.1 mg/dL — ABNORMAL LOW (ref 8.9–10.3)
Chloride: 101 mmol/L (ref 98–111)
Creatinine, Ser: 7.68 mg/dL — ABNORMAL HIGH (ref 0.44–1.00)
GFR, Estimated: 7 mL/min — ABNORMAL LOW (ref >60)
Glucose, Bld: 90 mg/dL (ref 70–99)
Phosphorus: 3 mg/dL (ref 2.5–4.6)
Potassium: 3.2 mmol/L — ABNORMAL LOW (ref 3.5–5.1)
Sodium: 134 mmol/L — ABNORMAL LOW (ref 135–145)

## 2023-07-08 LAB — CBC WITH DIFFERENTIAL/PLATELET
Abs Immature Granulocytes: 0.12 10*3/uL — ABNORMAL HIGH (ref 0.00–0.07)
Basophils Absolute: 0.1 10*3/uL (ref 0.0–0.1)
Basophils Relative: 1 %
Eosinophils Absolute: 0.4 10*3/uL (ref 0.0–0.5)
Eosinophils Relative: 3 %
HCT: 30.7 % — ABNORMAL LOW (ref 36.0–46.0)
Hemoglobin: 10.5 g/dL — ABNORMAL LOW (ref 12.0–15.0)
Immature Granulocytes: 1 %
Lymphocytes Relative: 10 %
Lymphs Abs: 1.4 10*3/uL (ref 0.7–4.0)
MCH: 29.6 pg (ref 26.0–34.0)
MCHC: 34.2 g/dL (ref 30.0–36.0)
MCV: 86.5 fL (ref 80.0–100.0)
Monocytes Absolute: 0.4 10*3/uL (ref 0.1–1.0)
Monocytes Relative: 3 %
Neutro Abs: 11.4 10*3/uL — ABNORMAL HIGH (ref 1.7–7.7)
Neutrophils Relative %: 82 %
Platelets: 211 10*3/uL (ref 150–400)
RBC: 3.55 MIL/uL — ABNORMAL LOW (ref 3.87–5.11)
RDW: 19.8 % — ABNORMAL HIGH (ref 11.5–15.5)
Smear Review: NORMAL
WBC: 13.8 10*3/uL — ABNORMAL HIGH (ref 4.0–10.5)
nRBC: 0.2 % (ref 0.0–0.2)

## 2023-07-08 LAB — BODY FLUID CELL COUNT WITH DIFFERENTIAL
Eos, Fluid: 2 %
Lymphs, Fluid: 6 %
Monocyte-Macrophage-Serous Fluid: 4 % — ABNORMAL LOW (ref 50–90)
Neutrophil Count, Fluid: 88 % — ABNORMAL HIGH (ref 0–25)
Total Nucleated Cell Count, Fluid: 394 uL (ref 0–1000)

## 2023-07-08 LAB — GLUCOSE, CAPILLARY
Glucose-Capillary: 70 mg/dL (ref 70–99)
Glucose-Capillary: 109 mg/dL — ABNORMAL HIGH (ref 70–99)
Glucose-Capillary: 100 mg/dL — ABNORMAL HIGH (ref 70–99)
Glucose-Capillary: 116 mg/dL — ABNORMAL HIGH (ref 70–99)
Glucose-Capillary: 102 mg/dL — ABNORMAL HIGH (ref 70–99)
Glucose-Capillary: 208 mg/dL — ABNORMAL HIGH (ref 70–99)

## 2023-07-08 SURGERY — TRANSESOPHAGEAL ECHOCARDIOGRAM (TEE) (CATHLAB)
Anesthesia: Monitor Anesthesia Care

## 2023-07-08 MED ORDER — DEXTROSE 50 % IV SOLN
INTRAVENOUS | Status: AC
  Administered 2023-07-08: 25 mL
  Filled 2023-07-08: qty 50

## 2023-07-08 MED ORDER — NEPRO/CARBSTEADY PO LIQD
237.0000 mL | Freq: Two times a day (BID) | ORAL | Status: DC
Start: 2023-07-09 — End: 2023-07-13
  Administered 2023-07-09: 237 mL via ORAL

## 2023-07-08 MED ORDER — POLYVINYL ALCOHOL 1.4 % OP SOLN
1.0000 [drp] | OPHTHALMIC | Status: DC | PRN
Start: 2023-07-08 — End: 2023-07-13

## 2023-07-08 MED ORDER — SODIUM CHLORIDE 0.9 % IV SOLN
INTRAVENOUS | Status: DC | PRN
Start: 2023-07-08 — End: 2023-07-08

## 2023-07-08 MED ORDER — PHENYLEPHRINE 80 MCG/ML (10ML) SYRINGE FOR IV PUSH (FOR BLOOD PRESSURE SUPPORT)
PREFILLED_SYRINGE | INTRAVENOUS | Status: DC | PRN
  Administered 2023-07-08: 80 ug via INTRAVENOUS
  Administered 2023-07-08: 120 ug via INTRAVENOUS

## 2023-07-08 MED ORDER — DELFLEX-LC/1.5% DEXTROSE 344 MOSM/L IP SOLN: INTRAPERITONEAL | Status: DC

## 2023-07-08 MED ORDER — DEXTROSE 50 % IV SOLN: 25.0000 mL | Freq: Once | INTRAVENOUS | Status: DC

## 2023-07-08 MED ORDER — PROPOFOL 10 MG/ML IV BOLUS
INTRAVENOUS | Status: DC | PRN
  Administered 2023-07-08: 50 mg via INTRAVENOUS
  Administered 2023-07-08: 30 mg via INTRAVENOUS
  Administered 2023-07-08: 20 mg via INTRAVENOUS

## 2023-07-08 MED ORDER — DELFLEX-LC/2.5% DEXTROSE 394 MOSM/L IP SOLN: INTRAPERITONEAL | Status: DC

## 2023-07-08 MED ORDER — LOPERAMIDE HCL 2 MG PO CAPS
2.0000 mg | ORAL_CAPSULE | ORAL | Status: DC | PRN
  Administered 2023-07-08 – 2023-07-11 (×5): 2 mg via ORAL
  Filled 2023-07-08 (×5): qty 1

## 2023-07-08 MED ORDER — DEXTROSE 50 % IV SOLN
INTRAVENOUS | Status: AC
  Administered 2023-07-08: 50 mL
  Filled 2023-07-08: qty 50

## 2023-07-08 MED ORDER — INSULIN GLARGINE 100 UNIT/ML ~~LOC~~ SOLN
3.0000 [IU] | Freq: Every day | SUBCUTANEOUS | Status: DC
  Administered 2023-07-08 – 2023-07-09 (×2): 3 [IU] via SUBCUTANEOUS
  Filled 2023-07-08 (×4): qty 0.03

## 2023-07-08 MED ORDER — HEPARIN SODIUM (PORCINE) 1000 UNIT/ML IJ SOLN
INTRAPERITONEAL | Status: DC
  Filled 2023-07-08 (×3): qty 3000

## 2023-07-08 MED ORDER — GENTAMICIN SULFATE 0.1 % EX CREA: 1.0000 | TOPICAL_CREAM | Freq: Every day | CUTANEOUS | Status: DC

## 2023-07-08 MED ORDER — DEXTROSE 50 % IV SOLN
INTRAVENOUS | Status: DC
Start: 2023-07-08 — End: 2023-07-08
  Filled 2023-07-08: qty 50

## 2023-07-08 MED ORDER — LIDOCAINE 2% (20 MG/ML) 5 ML SYRINGE
INTRAMUSCULAR | Status: DC | PRN
  Administered 2023-07-08: 100 mg via INTRAVENOUS

## 2023-07-08 MED ORDER — POTASSIUM CHLORIDE CRYS ER 20 MEQ PO TBCR
20.0000 meq | EXTENDED_RELEASE_TABLET | Freq: Once | ORAL | Status: AC
  Administered 2023-07-08: 20 meq via ORAL
  Filled 2023-07-08: qty 1

## 2023-07-08 MED ORDER — PROPOFOL 500 MG/50ML IV EMUL
INTRAVENOUS | Status: DC | PRN
  Administered 2023-07-08: 125 ug/kg/min via INTRAVENOUS

## 2023-07-08 NOTE — CV Procedure (Signed)
 INDICATIONS: bacteremia  PROCEDURE:   Informed consent was obtained prior to the procedure. The risks, benefits and alternatives for the procedure were discussed and the patient comprehended these risks.  Risks include, but are not limited to, cough, sore throat, vomiting, nausea, somnolence, esophageal and stomach trauma or perforation, bleeding, low blood pressure, aspiration, pneumonia, infection, trauma to the teeth and death.    After a procedural time-out, the oropharynx was anesthetized with 20% benzocaine spray.   During this procedure the patient was administered propofol per anesthesia.  The patient's heart rate, blood pressure, and oxygen saturation were monitored continuously during the procedure. The period of conscious sedation was 20 minutes, of which I was present face-to-face 100% of this time.  The transesophageal probe was inserted in the esophagus and stomach without difficulty and multiple views were obtained.  The patient was kept under observation until the patient left the procedure room.  The patient left the procedure room in stable condition.   Agitated microbubble saline contrast was administered.  COMPLICATIONS:    There were no immediate complications.  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 Patient repositioned with minimal improvement in image quality. No valvular vegetations noted. Weakly positive grade 1 right to left shunt through PFO.   RECOMMENDATIONS:     Ok to return to hospital room when alert.   Time Spent Directly with the Patient:  45 minutes   Parke Poisson 07/08/2023, 3:11 PM

## 2023-07-08 NOTE — Anesthesia Preprocedure Evaluation (Addendum)
 Anesthesia Evaluation  Patient identified by MRN, date of birth, ID band Patient awake    Reviewed: Allergy & Precautions, Patient's Chart, lab work & pertinent test results, Unable to perform ROS - Chart review only  Airway Mallampati: II  TM Distance: >3 FB Neck ROM: Full    Dental no notable dental hx.    Pulmonary asthma    Pulmonary exam normal        Cardiovascular Exercise Tolerance: Good hypertension, Pt. on home beta blockers and Pt. on medications  Rhythm:Regular Rate:Normal     Neuro/Psych  PSYCHIATRIC DISORDERS  Depression     Neuromuscular disease    GI/Hepatic Neg liver ROS,GERD  Controlled and Medicated,,  Endo/Other  diabetes, Well Controlled, Type 1, Insulin Dependent    Renal/GU ESRF and DialysisRenal disease     Musculoskeletal negative musculoskeletal ROS (+)    Abdominal Normal abdominal exam  (+)   Peds negative pediatric ROS (+)  Hematology  (+) Blood dyscrasia, anemia   Anesthesia Other Findings   Reproductive/Obstetrics negative OB ROS                             Anesthesia Physical Anesthesia Plan  ASA: 3  Anesthesia Plan: MAC   Post-op Pain Management: Minimal or no pain anticipated   Induction: Intravenous  PONV Risk Score and Plan: 2 and Propofol infusion, TIVA and Treatment may vary due to age or medical condition  Airway Management Planned: Nasal Cannula and Natural Airway  Additional Equipment:   Intra-op Plan:   Post-operative Plan:   Informed Consent:   Plan Discussed with:   Anesthesia Plan Comments:         Anesthesia Quick Evaluation

## 2023-07-08 NOTE — Progress Notes (Signed)
 Peritoneal Dialysis Treatment Initiation Note  Pre TX VS:see table below  Pre TX weight:58.8kg   Consent signed and in chart. PD treatment initiated via aseptic technique.  Patient is alert and oriented. No complaints of pain.   No specimen collected. PD exit site clean, dry and intact. Gentamycin and new dressing applied.   Hand-off given to the patient's nurse.  Bedside RN educated on PD machine and how to contact tech support when PD machine alarms.    07/08/23 1923  Vitals  Temp (!) 96.2 F (35.7 C)  Temp Source Axillary  BP 114/82  MAP (mmHg) 94  BP Location Left Arm  BP Method Automatic  Patient Position (if appropriate) Lying  Pulse Rate 89  Pulse Rate Source Monitor  Resp 13  MEWS COLOR  MEWS Score Color Yellow  Oxygen Therapy  O2 Device Room Air  Patient Activity (if Appropriate) In bed  Pulse Oximetry Type Continuous  Height and Weight  Weight 58.8 kg  Type of Scale Used Bed  Type of Weight Pre-Dialysis  BMI (Calculated) 19.71  MEWS Score  MEWS Temp 1  MEWS Systolic 0  MEWS Pulse 0  MEWS RR 1  MEWS LOC 0  MEWS Score 2     Hulen Shouts RN [facility]

## 2023-07-08 NOTE — H&P (View-Only) (Signed)
 PROGRESS NOTE        PATIENT DETAILS Name: Lindsey Murillo Age: 28 y.o. Sex: female Date of Birth: 10/26/1995 Admit Date: 07/04/2023 Admitting Physician Madaline Brilliant, DO ZOX:[id number], Verdis Frederickson, DO  Brief Summary: Patient is a 28 y.o.  female with history of ESRD on peritoneal dialysis, DM-1, HTN-who presented with abdominal pain-she was found to have septic shock in the setting of PD catheter associated peritonitis and MSSA bacteremia.  She was initially admitted to the ICU-required pressors-evaluated by ID/nephrology-placed on appropriate antimicrobial therapy-stabilized and subsequently transferred to [facility].  See below for further details.  Significant events: 2/25>> septic shock-PD catheter associated peritonitis/MSSA bacteremia-on Levophed 2/26 >> off Levophed 2/27>> transferred to [facility].  Significant studies: 2/24>> CXR: Strandy/patchy opacities left lower lobe-atelectasis versus pneumonia 2/24>> CT abdomen pelvis:  Fluid in the abdomen/pelvis, PD catheter in place 2/26>> echo: EF 50-55%, grade 2 diastolic dysfunction.  Significant microbiology data: 2/24>> COVID/influenza/RSV PCR: Negative 2/24>> peritoneal fluid culture: MSSA 2/24>> blood culture: MSSA 2/25>> peritoneal fluid culture: MSSA 2/27>> blood culture: Negative  Procedures: None  Consults: PCCM Nephrology ID Opthalmology  Subjective: Feels better-less abdominal pain today.  Objective: Vitals: Blood pressure (!) 136/90, pulse 91, temperature 98.5 F (36.9 C), temperature source Oral, resp. rate 14, height 5\' 8"  (1.727 m), weight 51.7 kg, SpO2 97%.   Exam: Gen Exam:Alert awake-not in any distress HEENT:atraumatic, normocephalic Chest: B/L clear to auscultation anteriorly CVS:S1S2 regular Abdomen: Soft-mild diffuse tenderness-no peritoneal signs.  Better exam today than yesterday. Extremities:no edema Neurology: Non focal Skin: no rash  Pertinent Labs/Radiology:    Latest  Ref Rng & Units 07/08/2023    6:08 AM 07/07/2023    5:20 AM 07/06/2023    3:25 AM  CBC  WBC 4.0 - 10.5 K/uL 13.8  13.5  15.8   Hemoglobin 12.0 - 15.0 g/dL 45.4  09.8  9.9   Hematocrit 36.0 - 46.0 % 30.7  29.9  29.6   Platelets 150 - 400 K/uL 211  205  294     Lab Results  Component Value Date   NA 134 (L) 07/08/2023   K 3.2 (L) 07/08/2023   CL 101 07/08/2023   CO2 19 (L) 07/08/2023      Assessment/Plan: Septic shock secondary to PD catheter related MSSA peritonitis and MSSA bacteremia. Sepsis physiology rapidly improving TTE without evidence of endocarditis Repeat blood cultures on 2/27 negative so far Repeat peritoneal fluid 2/28-significant reduction in WBC count Remains on IV Ancef TEE later today Per ophthalmology no evidence of endophthalmitis ID following await further recommendations.  ESRD on peritoneal dialysis Nephrology following ID/renal discussion-given improvement in WBC count and peritoneal fluid on 2/28-okay to keep PD catheter in place for now.  Normocytic anemia Secondary to acute illness and ESRD Follow CBC  Hypokalemia Replete/recheck  DM-1 (A1c 7.2-2/13) CBGs relatively stable-borderline hypoglycemic this morning N.p.o. for TEE today Decrease Lantus to 3 units-continue SSI Follow/optimize.  Recent Labs    07/07/23 1219 07/07/23 1650 07/07/23 2131  GLUCAP 104* 126* 170*    HLD Statin  ?  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  Retinal detachment Poor historian Appreciate ophthalmology input on 2/27-resume outpatient follow-up with her primary ophthalmologist.  Pressure Ulcer: Agree with assessment and plan as outlined Pressure Injury  07/05/23 Sacrum Mid Stage 2 -  Partial thickness loss of dermis presenting as a shallow open injury with a red, pink wound bed without slough. 2x0.5 linear mid sacral wound (Active)  07/05/23 0000   Location: Sacrum  Location Orientation: Mid  Staging: Stage 2 -  Partial thickness loss of dermis presenting as a shallow open injury with a red, pink wound bed without slough.  Wound Description (Comments): 2x0.5 linear mid sacral wound  Present on Admission: Yes  Dressing Type Foam - Lift dressing to assess site every shift 07/07/23 1938    Underweight: Estimated body mass index is 17.33 kg/m as calculated from the following:   Height as of this encounter: 5\' 8"  (1.727 m).   Weight as of this encounter: 51.7 kg.   Code status:   Code Status: Full Code   DVT Prophylaxis: heparin injection 5,000 Units Start: 07/04/23 2230 SCDs Start: 07/04/23 2229  Family Communication: Mother at bedside   Disposition Plan: Status is: Inpatient Remains inpatient appropriate because: Severity of illness   Planned Discharge Destination:Home health   Diet: Diet Order             Diet NPO time specified  Diet effective midnight                     Antimicrobial agents: Anti-infectives (From admission, onward)    Start     Dose/Rate Route Frequency Ordered Stop   07/05/23 2000  ceFAZolin (ANCEF) IVPB 1 g/50 mL premix        1 g 100 mL/hr over 30 Minutes Intravenous Every 24 hours 07/05/23 1737     07/05/23 1800  ceFEPIme (MAXIPIME) 1 g in sodium chloride 0.9 % 100 mL IVPB  Status:  Discontinued        1 g 200 mL/hr over 30 Minutes Intravenous Every 24 hours 07/05/23 0423 07/05/23 0914   07/05/23 1800  cefTAZidime (FORTAZ) 1 g in sodium chloride 0.9 % 100 mL IVPB  Status:  Discontinued        1 g 200 mL/hr over 30 Minutes Intravenous Every 24 hours 07/05/23 0914 07/05/23 1643   07/05/23 0500  ceFEPIme (MAXIPIME) 1 g in sodium chloride 0.9 % 100 mL IVPB  Status:  Discontinued        1 g 200 mL/hr over 30 Minutes Intravenous Every 24 hours 07/05/23 0423 07/05/23 0423   07/05/23 0422  vancomycin variable dose per unstable renal function (pharmacist dosing)  Status:  Discontinued          Does not apply See admin instructions 07/05/23 0423 07/05/23 1643   07/05/23 0345  vancomycin (VANCOCIN) IVPB 1000 mg/200 mL premix  Status:  Discontinued        1,000 mg 200 mL/hr over 60 Minutes Intravenous Every 12 hours 07/05/23 0259 07/05/23 0452   07/05/23 0345  ceFEPIme (MAXIPIME) 500 mg in dextrose 5 % 50 mL IVPB  Status:  Discontinued        500 mg 110 mL/hr over 30 Minutes Intravenous Every 12 hours 07/05/23 0259 07/05/23 0423   07/04/23 1745  ceFEPIme (MAXIPIME) 2 g in sodium chloride 0.9 % 100 mL IVPB        2 g 200 mL/hr over 30 Minutes Intravenous  Once 07/04/23 1739 07/04/23 1835   07/04/23 1745  vancomycin (VANCOREADY) IVPB 1500 mg/300 mL        1,500 mg 150 mL/hr over 120 Minutes Intravenous  Once 07/04/23 1743 07/04/23 2209  MEDICATIONS: Scheduled Meds:  calcitRIOL  1 mcg Oral Daily   Chlorhexidine Gluconate Cloth  6 each Topical Daily   cinacalcet  30 mg Oral Q breakfast   famotidine  10 mg Oral Daily   gentamicin cream  1 Application Topical Daily   heparin  5,000 Units Subcutaneous Q8H   insulin aspart  0-6 Units Subcutaneous TID WC   insulin glargine  5 Units Subcutaneous QHS   midodrine  5 mg Oral TID WC   multivitamin  1 tablet Oral Daily   potassium chloride  20 mEq Oral BID   potassium chloride  20 mEq Oral Once   rosuvastatin  10 mg Oral Daily   Continuous Infusions:   ceFAZolin (ANCEF) IV 1 g (07/07/23 2012)   heparin sodium (porcine) 1,500 Units in dialysis solution 1.5% low-MG/low-CA 3,000 mL dialysis solution     heparin sodium (porcine) 1,500 Units in dialysis solution 2.5% low-MG/low-CA 3,000 mL dialysis solution     PRN Meds:.acetaminophen, docusate sodium, HYDROmorphone (DILAUDID) injection, ondansetron (ZOFRAN) IV, mouth rinse, polyethylene glycol, prochlorperazine   I have personally reviewed following labs and imaging studies  LABORATORY DATA: CBC: Recent Labs  Lab 07/04/23 1542 07/05/23 0113 07/05/23 0754  07/06/23 0325 07/07/23 0520 07/08/23 0608  WBC 10.0 20.6* 20.5* 15.8* 13.5* 13.8*  NEUTROABS 9.0*  --   --   --   --  11.4*  HGB 10.5* 10.6* 10.3* 9.9* 10.0* 10.5*  HCT 32.1* 31.9* 31.0* 29.6* 29.9* 30.7*  MCV 89.7 88.6 87.8 88.1 87.2 86.5  PLT 244 256 257 294 205 211    Basic Metabolic Panel: Recent Labs  Lab 07/04/23 2230 07/05/23 0113 07/05/23 0124 07/05/23 0754 07/05/23 1248 07/06/23 0325 07/07/23 0520 07/08/23 0608  NA 135 135  --  134*  --  132*  134* 131* 134*  K 2.3* 2.9*  --  2.5* 3.5 3.5  3.6 3.3* 3.2*  CL 103 106  --  105  --  100  101 100 101  CO2 20* 19*  --  18*  --  18*  20* 17* 19*  GLUCOSE 132* 107*  --  98  --  208*  209* 201* 90  BUN 35* 36*  --  38*  --  35*  34* 33* 29*  CREATININE 9.84* 10.21*  --  10.37*  --  8.88*  8.86* 7.91* 7.68*  CALCIUM 7.1* 7.3*  --  7.2*  --  7.5*  7.5* 7.2* 7.1*  MG 1.8  --  2.0 1.9  --  1.9 1.9  --   PHOS  --  4.2 4.2  --   --  3.6 3.3 3.0    GFR: Estimated Creatinine Clearance: 8.9 mL/min (A) (by C-G formula based on SCr of 7.68 mg/dL (H)).  Liver Function Tests: Recent Labs  Lab 07/04/23 1542 07/05/23 0113 07/06/23 0325 07/07/23 0520 07/08/23 0608  AST 10*  --   --   --   --   ALT 10  --   --   --   --   ALKPHOS 84  --   --   --   --   BILITOT 0.6  --   --   --   --   PROT 5.4*  --   --   --   --   ALBUMIN 1.7* <1.5* <1.5* <1.5* <1.5*   No results for input(s): "LIPASE", "AMYLASE" in the last 168 hours. No results for input(s): "AMMONIA" in the last 168 hours.  Coagulation Profile:  Recent Labs  Lab 07/04/23 1542  INR 1.4*    Cardiac Enzymes: No results for input(s): "CKTOTAL", "CKMB", "CKMBINDEX", "TROPONINI" in the last 168 hours.  BNP (last 3 results) No results for input(s): "PROBNP" in the last 8760 hours.  Lipid Profile: No results for input(s): "CHOL", "HDL", "LDLCALC", "TRIG", "CHOLHDL", "LDLDIRECT" in the last 72 hours.  Thyroid Function Tests: No results for input(s): "TSH",  "T4TOTAL", "FREET4", "T3FREE", "THYROIDAB" in the last 72 hours.  Anemia Panel: No results for input(s): "VITAMINB12", "FOLATE", "FERRITIN", "TIBC", "IRON", "RETICCTPCT" in the last 72 hours.  Urine analysis:    Component Value Date/Time   COLORURINE YELLOW 10/07/2021 1325   APPEARANCEUR TURBID (A) 10/07/2021 1325   LABSPEC 1.011 10/07/2021 1325   PHURINE 5.0 10/07/2021 1325   GLUCOSEU 50 (A) 10/07/2021 1325   HGBUR SMALL (A) 10/07/2021 1325   BILIRUBINUR NEGATIVE 10/07/2021 1325   KETONESUR NEGATIVE 10/07/2021 1325   PROTEINUR 100 (A) 10/07/2021 1325   UROBILINOGEN 0.2 01/07/2015 1723   NITRITE NEGATIVE 10/07/2021 1325   LEUKOCYTESUR LARGE (A) 10/07/2021 1325    Sepsis Labs: Lactic Acid, Venous    Component Value Date/Time   LATICACIDVEN 0.7 07/04/2023 2109    MICROBIOLOGY: Recent Results (from the past 240 hours)  Culture, blood (Routine x 2)     Status: Abnormal   Collection Time: 07/04/23  3:42 PM   Specimen: BLOOD  Result Value Ref Range Status   Specimen Description BLOOD SITE NOT SPECIFIED  Final   Special Requests   Final    BOTTLES DRAWN AEROBIC AND ANAEROBIC Blood Culture results may not be optimal due to an inadequate volume of blood received in culture bottles   Culture  Setup Time   Final    GRAM POSITIVE COCCI IN CLUSTERS AEROBIC BOTTLE ONLY CRITICAL RESULT CALLED TO, READ BACK BY AND VERIFIED WITH: PHARMD ADaphane Shepherd [id number] @ 1604 FH Performed at [facility], [address], [place], [place] [address]    Culture STAPHYLOCOCCUS AUREUS (A)  Final   Report Status 07/07/2023 FINAL  Final   Organism ID, Bacteria STAPHYLOCOCCUS AUREUS  Final      Susceptibility   Staphylococcus aureus - MIC*    CIPROFLOXACIN <=0.5 SENSITIVE Sensitive     ERYTHROMYCIN <=0.25 SENSITIVE Sensitive     GENTAMICIN <=0.5 SENSITIVE Sensitive     OXACILLIN <=0.25 SENSITIVE Sensitive     TETRACYCLINE <=1 SENSITIVE Sensitive     VANCOMYCIN 1 SENSITIVE Sensitive      TRIMETH/SULFA <=10 SENSITIVE Sensitive     CLINDAMYCIN <=0.25 SENSITIVE Sensitive     RIFAMPIN <=0.5 SENSITIVE Sensitive     Inducible Clindamycin NEGATIVE Sensitive     LINEZOLID 2 SENSITIVE Sensitive     * STAPHYLOCOCCUS AUREUS  Blood Culture ID Panel (Reflexed)     Status: Abnormal   Collection Time: 07/04/23  3:42 PM  Result Value Ref Range Status   Enterococcus faecalis NOT DETECTED NOT DETECTED Final   Enterococcus Faecium NOT DETECTED NOT DETECTED Final   Listeria monocytogenes NOT DETECTED NOT DETECTED Final   Staphylococcus species DETECTED (A) NOT DETECTED Final    Comment: CRITICAL RESULT CALLED TO, READ BACK BY AND VERIFIED WITH: PHARMD A. MEYER [id number] @ 1604 FH    Staphylococcus aureus (BCID) DETECTED (A) NOT DETECTED Final    Comment: CRITICAL RESULT CALLED TO, READ BACK BY AND VERIFIED WITH: PHARMD A. MEYER [id number] @ 1604 FH    Staphylococcus epidermidis NOT DETECTED NOT DETECTED Final   Staphylococcus lugdunensis NOT  DETECTED NOT DETECTED Final   Streptococcus species NOT DETECTED NOT DETECTED Final   Streptococcus agalactiae NOT DETECTED NOT DETECTED Final   Streptococcus pneumoniae NOT DETECTED NOT DETECTED Final   Streptococcus pyogenes NOT DETECTED NOT DETECTED Final   A.calcoaceticus-baumannii NOT DETECTED NOT DETECTED Final   Bacteroides fragilis NOT DETECTED NOT DETECTED Final   Enterobacterales NOT DETECTED NOT DETECTED Final   Enterobacter cloacae complex NOT DETECTED NOT DETECTED Final   Escherichia coli NOT DETECTED NOT DETECTED Final   Klebsiella aerogenes NOT DETECTED NOT DETECTED Final   Klebsiella oxytoca NOT DETECTED NOT DETECTED Final   Klebsiella pneumoniae NOT DETECTED NOT DETECTED Final   Proteus species NOT DETECTED NOT DETECTED Final   Salmonella species NOT DETECTED NOT DETECTED Final   Serratia marcescens NOT DETECTED NOT DETECTED Final   Haemophilus influenzae NOT DETECTED NOT DETECTED Final   Neisseria meningitidis NOT DETECTED NOT  DETECTED Final   Pseudomonas aeruginosa NOT DETECTED NOT DETECTED Final   Stenotrophomonas maltophilia NOT DETECTED NOT DETECTED Final   Candida albicans NOT DETECTED NOT DETECTED Final   Candida auris NOT DETECTED NOT DETECTED Final   Candida glabrata NOT DETECTED NOT DETECTED Final   Candida krusei NOT DETECTED NOT DETECTED Final   Candida parapsilosis NOT DETECTED NOT DETECTED Final   Candida tropicalis NOT DETECTED NOT DETECTED Final   Cryptococcus neoformans/gattii NOT DETECTED NOT DETECTED Final   Meth resistant mecA/C and MREJ NOT DETECTED NOT DETECTED Final    Comment: Performed at [facility], [address] [place], [place] [address]  Resp panel by RT-PCR (RSV, Flu A&B, Covid) Anterior Nasal Swab     Status: None   Collection Time: 07/04/23  3:46 PM   Specimen: Anterior Nasal Swab  Result Value Ref Range Status   SARS Coronavirus 2 by RT PCR NEGATIVE NEGATIVE Final   Influenza A by PCR NEGATIVE NEGATIVE Final   Influenza B by PCR NEGATIVE NEGATIVE Final    Comment: (NOTE) The Xpert Xpress SARS-CoV-2/FLU/RSV plus assay is intended as an aid in the diagnosis of influenza from Nasopharyngeal swab specimens and should not be used as a sole basis for treatment. Nasal washings and aspirates are unacceptable for Xpert Xpress SARS-CoV-2/FLU/RSV testing.  Fact Sheet for Patients: BloggerCourse.com  Fact Sheet for Healthcare Providers: SeriousBroker.it  This test is not yet approved or cleared by the [place] FDA and has been authorized for detection and/or diagnosis of SARS-CoV-2 by FDA under an Emergency Use Authorization (EUA). This EUA will remain in effect (meaning this test can be used) for the duration of the COVID-19 declaration under Section 564(b)(1) of the Act, 21 U.S.C. section 360bbb-3(b)(1), unless the authorization is terminated or revoked.     Resp Syncytial Virus by PCR NEGATIVE NEGATIVE Final     Comment: (NOTE) Fact Sheet for Patients: BloggerCourse.com  Fact Sheet for Healthcare Providers: SeriousBroker.it  This test is not yet approved or cleared by the [place] FDA and has been authorized for detection and/or diagnosis of SARS-CoV-2 by FDA under an Emergency Use Authorization (EUA). This EUA will remain in effect (meaning this test can be used) for the duration of the COVID-19 declaration under Section 564(b)(1) of the Act, 21 U.S.C. section 360bbb-3(b)(1), unless the authorization is terminated or revoked.  Performed at [facility], [address] [place], [place] [address]   Body fluid culture w Gram Stain     Status: None   Collection Time: 07/04/23  7:34 PM   Specimen: Peritoneal  Dialysate; Body Fluid  Result Value Ref Range Status   Specimen Description PERITONEAL DIALYSATE ABDOMEN  Final   Special Requests NONE  Final   Gram Stain   Final    ABUNDANT WBC PRESENT, PREDOMINANTLY PMN NO ORGANISMS SEEN Performed at [facility], [address] [place], [place] [address]    Culture MODERATE STAPHYLOCOCCUS AUREUS  Final   Report Status 07/06/2023 FINAL  Final   Organism ID, Bacteria STAPHYLOCOCCUS AUREUS  Final      Susceptibility   Staphylococcus aureus - MIC*    CIPROFLOXACIN <=0.5 SENSITIVE Sensitive     ERYTHROMYCIN <=0.25 SENSITIVE Sensitive     GENTAMICIN <=0.5 SENSITIVE Sensitive     OXACILLIN <=0.25 SENSITIVE Sensitive     TETRACYCLINE <=1 SENSITIVE Sensitive     VANCOMYCIN 1 SENSITIVE Sensitive     TRIMETH/SULFA <=10 SENSITIVE Sensitive     CLINDAMYCIN <=0.25 SENSITIVE Sensitive     RIFAMPIN <=0.5 SENSITIVE Sensitive     Inducible Clindamycin NEGATIVE Sensitive     LINEZOLID 2 SENSITIVE Sensitive     * MODERATE STAPHYLOCOCCUS AUREUS  Culture, blood (Routine x 2)     Status: None (Preliminary result)   Collection Time: 07/04/23  9:08 PM   Specimen: BLOOD LEFT ARM  Result Value Ref  Range Status   Specimen Description BLOOD LEFT ARM  Final   Special Requests   Final    BOTTLES DRAWN AEROBIC AND ANAEROBIC Blood Culture results may not be optimal due to an inadequate volume of blood received in culture bottles   Culture   Final    NO GROWTH 4 DAYS Performed at [facility], [address] [place], [place] [address]    Report Status PENDING  Incomplete  MRSA Next Gen by PCR, Nasal     Status: None   Collection Time: 07/05/23 12:12 AM   Specimen: Nasal Mucosa; Nasal Swab  Result Value Ref Range Status   MRSA by PCR Next Gen NOT DETECTED NOT DETECTED Final    Comment: (NOTE) The GeneXpert MRSA Assay (FDA approved for NASAL specimens only), is one component of a comprehensive MRSA colonization surveillance program. It is not intended to diagnose MRSA infection nor to guide or monitor treatment for MRSA infections. Test performance is not FDA approved in patients less than 24 years old. Performed at [facility], [address], [place], [place] [address]   Body fluid culture w Gram Stain     Status: None   Collection Time: 07/05/23  7:19 AM   Specimen: Peritoneal Washings; Body Fluid  Result Value Ref Range Status   Specimen Description PERITONEAL  Final   Special Requests NONE  Final   Gram Stain   Final    ABUNDANT WBC PRESENT, PREDOMINANTLY PMN RARE GRAM POSITIVE COCCI Performed at [facility], [address] [place], [place] [address]    Culture FEW STAPHYLOCOCCUS AUREUS  Final   Report Status 07/07/2023 FINAL  Final   Organism ID, Bacteria STAPHYLOCOCCUS AUREUS  Final      Susceptibility   Staphylococcus aureus - MIC*    CIPROFLOXACIN <=0.5 SENSITIVE Sensitive     ERYTHROMYCIN <=0.25 SENSITIVE Sensitive     GENTAMICIN <=0.5 SENSITIVE Sensitive     OXACILLIN 0.5 SENSITIVE Sensitive     TETRACYCLINE <=1 SENSITIVE Sensitive     VANCOMYCIN 1 SENSITIVE Sensitive     TRIMETH/SULFA <=10 SENSITIVE Sensitive     CLINDAMYCIN <=0.25  SENSITIVE Sensitive  RIFAMPIN <=0.5 SENSITIVE Sensitive     Inducible Clindamycin NEGATIVE Sensitive     LINEZOLID 2 SENSITIVE Sensitive     * FEW STAPHYLOCOCCUS AUREUS  Culture, blood (Routine X 2) w Reflex to ID Panel     Status: None (Preliminary result)   Collection Time: 07/07/23  5:20 AM   Specimen: BLOOD LEFT HAND  Result Value Ref Range Status   Specimen Description BLOOD LEFT HAND  Final   Special Requests   Final    BOTTLES DRAWN AEROBIC ONLY Blood Culture results may not be optimal due to an inadequate volume of blood received in culture bottles   Culture   Final    NO GROWTH 1 DAY Performed at [facility], [address], [place], [place] [address]    Report Status PENDING  Incomplete  Culture, blood (Routine X 2) w Reflex to ID Panel     Status: None (Preliminary result)   Collection Time: 07/07/23  5:27 AM   Specimen: BLOOD LEFT ARM  Result Value Ref Range Status   Specimen Description BLOOD LEFT ARM  Final   Special Requests   Final    BOTTLES DRAWN AEROBIC AND ANAEROBIC Blood Culture adequate volume   Culture   Final    NO GROWTH 1 DAY Performed at [facility], [address] [place], [place] [address]    Report Status PENDING  Incomplete    RADIOLOGY STUDIES/RESULTS: CT CHEST WO CONTRAST Result Date: 07/07/2023 CLINICAL DATA:  Dyspnea, chronic, unclear etiology Echo showing extrinsic compression of the left atrium-chest x-ray unrevealing-please assess with CT chest. EXAM: CT CHEST WITHOUT CONTRAST TECHNIQUE: Multidetector CT imaging of the chest was performed following the standard protocol without IV contrast. Evaluation of the heart, vascular structures, and soft tissues is limited without IV contrast. RADIATION DOSE REDUCTION: This exam was performed according to the departmental dose-optimization program which includes automated exposure control, adjustment of the mA and/or kV according to patient size and/or use of iterative reconstruction  technique. COMPARISON:  07/04/2023, 10/27/2019 FINDINGS: Cardiovascular: Unenhanced imaging of the heart demonstrates cardiomegaly without pericardial effusion. Evaluation of the cardiac chambers is limited without IV contrast. Normal caliber of the thoracic aorta. Vascular lumen cannot be evaluated without IV contrast. Mediastinum/Nodes: No enlarged mediastinal or axillary lymph nodes. Thyroid gland, trachea, and esophagus demonstrate no significant findings. Lungs/Pleura: Trace left pleural effusion. Scattered areas of consolidation within the dependent left upper lobe and left lower lobe, unchanged since prior study and compatible with early airspace disease or atelectasis. Right chest is clear. No pneumothorax. Central airways are patent. Upper Abdomen: Trace fluid within the upper abdomen, decreased since prior study. Musculoskeletal: No acute or destructive bony lesions. IMPRESSION: 1. Cardiomegaly without pericardial effusion. Evaluation of the heart is limited without IV contrast. There is no abnormality identified on this unenhanced exam resulting in extrinsic compression of the left atrium as reported in the clinical history. 2. Stable patchy left basilar consolidation and small left effusion, favor atelectasis over airspace disease. 3. Trace upper abdominal free fluid. Electronically Signed   By: Sharlet Salina M.D.   On: 07/07/2023 16:19     LOS: 4 days   Jeoffrey Massed, MD  [facility]    To contact the attending provider between 7A-7P or the covering provider during after hours 7P-7A, please log into the web site [email or web address] and access using universal [place] password for that web site. If you do not have the password, please call the hospital operator.  07/08/2023, 11:44 AM

## 2023-07-08 NOTE — Transfer of Care (Signed)
 Immediate Anesthesia Transfer of Care Note  Patient: Lindsey Murillo  Procedure(s) Performed: TRANSESOPHAGEAL ECHOCARDIOGRAM  Patient Location: PACU  Anesthesia Type:MAC  Level of Consciousness: drowsy  Airway & Oxygen Therapy: Patient Spontanous Breathing and Patient connected to face mask oxygen  Post-op Assessment: Report given to RN and Post -op Vital signs reviewed and stable  Post vital signs: Reviewed and stable  Last Vitals:  Vitals Value Taken Time  BP 88/63 0513  Temp    Pulse 95   Resp 7   SpO2 100%     Last Pain:  Vitals:   07/08/23 1337  TempSrc: Temporal  PainSc: 7       Patients Stated Pain Goal: 0 (07/07/23 1345)  Complications: No notable events documented.

## 2023-07-08 NOTE — Progress Notes (Signed)
 Echocardiogram Echocardiogram Transesophageal has been performed.  Lucendia Herrlich 07/08/2023, 3:39 PM

## 2023-07-08 NOTE — Progress Notes (Signed)
 PROGRESS NOTE        PATIENT DETAILS Name: Lindsey Murillo Age: 28 y.o. Sex: female Date of Birth: 10/26/1995 Admit Date: 07/04/2023 Admitting Physician Madaline Brilliant, DO ZOX:[id number], Verdis Frederickson, DO  Brief Summary: Patient is a 28 y.o.  female with history of ESRD on peritoneal dialysis, DM-1, HTN-who presented with abdominal pain-she was found to have septic shock in the setting of PD catheter associated peritonitis and MSSA bacteremia.  She was initially admitted to the ICU-required pressors-evaluated by ID/nephrology-placed on appropriate antimicrobial therapy-stabilized and subsequently transferred to [facility].  See below for further details.  Significant events: 2/25>> septic shock-PD catheter associated peritonitis/MSSA bacteremia-on Levophed 2/26 >> off Levophed 2/27>> transferred to [facility].  Significant studies: 2/24>> CXR: Strandy/patchy opacities left lower lobe-atelectasis versus pneumonia 2/24>> CT abdomen pelvis:  Fluid in the abdomen/pelvis, PD catheter in place 2/26>> echo: EF 50-55%, grade 2 diastolic dysfunction.  Significant microbiology data: 2/24>> COVID/influenza/RSV PCR: Negative 2/24>> peritoneal fluid culture: MSSA 2/24>> blood culture: MSSA 2/25>> peritoneal fluid culture: MSSA 2/27>> blood culture: Negative  Procedures: None  Consults: PCCM Nephrology ID Opthalmology  Subjective: Feels better-less abdominal pain today.  Objective: Vitals: Blood pressure (!) 136/90, pulse 91, temperature 98.5 F (36.9 C), temperature source Oral, resp. rate 14, height 5\' 8"  (1.727 m), weight 51.7 kg, SpO2 97%.   Exam: Gen Exam:Alert awake-not in any distress HEENT:atraumatic, normocephalic Chest: B/L clear to auscultation anteriorly CVS:S1S2 regular Abdomen: Soft-mild diffuse tenderness-no peritoneal signs.  Better exam today than yesterday. Extremities:no edema Neurology: Non focal Skin: no rash  Pertinent Labs/Radiology:    Latest  Ref Rng & Units 07/08/2023    6:08 AM 07/07/2023    5:20 AM 07/06/2023    3:25 AM  CBC  WBC 4.0 - 10.5 K/uL 13.8  13.5  15.8   Hemoglobin 12.0 - 15.0 g/dL 45.4  09.8  9.9   Hematocrit 36.0 - 46.0 % 30.7  29.9  29.6   Platelets 150 - 400 K/uL 211  205  294     Lab Results  Component Value Date   NA 134 (L) 07/08/2023   K 3.2 (L) 07/08/2023   CL 101 07/08/2023   CO2 19 (L) 07/08/2023      Assessment/Plan: Septic shock secondary to PD catheter related MSSA peritonitis and MSSA bacteremia. Sepsis physiology rapidly improving TTE without evidence of endocarditis Repeat blood cultures on 2/27 negative so far Repeat peritoneal fluid 2/28-significant reduction in WBC count Remains on IV Ancef TEE later today Per ophthalmology no evidence of endophthalmitis ID following await further recommendations.  ESRD on peritoneal dialysis Nephrology following ID/renal discussion-given improvement in WBC count and peritoneal fluid on 2/28-okay to keep PD catheter in place for now.  Normocytic anemia Secondary to acute illness and ESRD Follow CBC  Hypokalemia Replete/recheck  DM-1 (A1c 7.2-2/13) CBGs relatively stable-borderline hypoglycemic this morning N.p.o. for TEE today Decrease Lantus to 3 units-continue SSI Follow/optimize.  Recent Labs    07/07/23 1219 07/07/23 1650 07/07/23 2131  GLUCAP 104* 126* 170*    HLD Statin  ?  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  Retinal detachment Poor historian Appreciate ophthalmology input on 2/27-resume outpatient follow-up with her primary ophthalmologist.  Pressure Ulcer: Agree with assessment and plan as outlined Pressure Injury  07/05/23 Sacrum Mid Stage 2 -  Partial thickness loss of dermis presenting as a shallow open injury with a red, pink wound bed without slough. 2x0.5 linear mid sacral wound (Active)  07/05/23 0000   Location: Sacrum  Location Orientation: Mid  Staging: Stage 2 -  Partial thickness loss of dermis presenting as a shallow open injury with a red, pink wound bed without slough.  Wound Description (Comments): 2x0.5 linear mid sacral wound  Present on Admission: Yes  Dressing Type Foam - Lift dressing to assess site every shift 07/07/23 1938    Underweight: Estimated body mass index is 17.33 kg/m as calculated from the following:   Height as of this encounter: 5\' 8"  (1.727 m).   Weight as of this encounter: 51.7 kg.   Code status:   Code Status: Full Code   DVT Prophylaxis: heparin injection 5,000 Units Start: 07/04/23 2230 SCDs Start: 07/04/23 2229  Family Communication: Mother at bedside   Disposition Plan: Status is: Inpatient Remains inpatient appropriate because: Severity of illness   Planned Discharge Destination:Home health   Diet: Diet Order             Diet NPO time specified  Diet effective midnight                     Antimicrobial agents: Anti-infectives (From admission, onward)    Start     Dose/Rate Route Frequency Ordered Stop   07/05/23 2000  ceFAZolin (ANCEF) IVPB 1 g/50 mL premix        1 g 100 mL/hr over 30 Minutes Intravenous Every 24 hours 07/05/23 1737     07/05/23 1800  ceFEPIme (MAXIPIME) 1 g in sodium chloride 0.9 % 100 mL IVPB  Status:  Discontinued        1 g 200 mL/hr over 30 Minutes Intravenous Every 24 hours 07/05/23 0423 07/05/23 0914   07/05/23 1800  cefTAZidime (FORTAZ) 1 g in sodium chloride 0.9 % 100 mL IVPB  Status:  Discontinued        1 g 200 mL/hr over 30 Minutes Intravenous Every 24 hours 07/05/23 0914 07/05/23 1643   07/05/23 0500  ceFEPIme (MAXIPIME) 1 g in sodium chloride 0.9 % 100 mL IVPB  Status:  Discontinued        1 g 200 mL/hr over 30 Minutes Intravenous Every 24 hours 07/05/23 0423 07/05/23 0423   07/05/23 0422  vancomycin variable dose per unstable renal function (pharmacist dosing)  Status:  Discontinued          Does not apply See admin instructions 07/05/23 0423 07/05/23 1643   07/05/23 0345  vancomycin (VANCOCIN) IVPB 1000 mg/200 mL premix  Status:  Discontinued        1,000 mg 200 mL/hr over 60 Minutes Intravenous Every 12 hours 07/05/23 0259 07/05/23 0452   07/05/23 0345  ceFEPIme (MAXIPIME) 500 mg in dextrose 5 % 50 mL IVPB  Status:  Discontinued        500 mg 110 mL/hr over 30 Minutes Intravenous Every 12 hours 07/05/23 0259 07/05/23 0423   07/04/23 1745  ceFEPIme (MAXIPIME) 2 g in sodium chloride 0.9 % 100 mL IVPB        2 g 200 mL/hr over 30 Minutes Intravenous  Once 07/04/23 1739 07/04/23 1835   07/04/23 1745  vancomycin (VANCOREADY) IVPB 1500 mg/300 mL        1,500 mg 150 mL/hr over 120 Minutes Intravenous  Once 07/04/23 1743 07/04/23 2209  MEDICATIONS: Scheduled Meds:  calcitRIOL  1 mcg Oral Daily   Chlorhexidine Gluconate Cloth  6 each Topical Daily   cinacalcet  30 mg Oral Q breakfast   famotidine  10 mg Oral Daily   gentamicin cream  1 Application Topical Daily   heparin  5,000 Units Subcutaneous Q8H   insulin aspart  0-6 Units Subcutaneous TID WC   insulin glargine  5 Units Subcutaneous QHS   midodrine  5 mg Oral TID WC   multivitamin  1 tablet Oral Daily   potassium chloride  20 mEq Oral BID   potassium chloride  20 mEq Oral Once   rosuvastatin  10 mg Oral Daily   Continuous Infusions:   ceFAZolin (ANCEF) IV 1 g (07/07/23 2012)   heparin sodium (porcine) 1,500 Units in dialysis solution 1.5% low-MG/low-CA 3,000 mL dialysis solution     heparin sodium (porcine) 1,500 Units in dialysis solution 2.5% low-MG/low-CA 3,000 mL dialysis solution     PRN Meds:.acetaminophen, docusate sodium, HYDROmorphone (DILAUDID) injection, ondansetron (ZOFRAN) IV, mouth rinse, polyethylene glycol, prochlorperazine   I have personally reviewed following labs and imaging studies  LABORATORY DATA: CBC: Recent Labs  Lab 07/04/23 1542 07/05/23 0113 07/05/23 0754  07/06/23 0325 07/07/23 0520 07/08/23 0608  WBC 10.0 20.6* 20.5* 15.8* 13.5* 13.8*  NEUTROABS 9.0*  --   --   --   --  11.4*  HGB 10.5* 10.6* 10.3* 9.9* 10.0* 10.5*  HCT 32.1* 31.9* 31.0* 29.6* 29.9* 30.7*  MCV 89.7 88.6 87.8 88.1 87.2 86.5  PLT 244 256 257 294 205 211    Basic Metabolic Panel: Recent Labs  Lab 07/04/23 2230 07/05/23 0113 07/05/23 0124 07/05/23 0754 07/05/23 1248 07/06/23 0325 07/07/23 0520 07/08/23 0608  NA 135 135  --  134*  --  132*  134* 131* 134*  K 2.3* 2.9*  --  2.5* 3.5 3.5  3.6 3.3* 3.2*  CL 103 106  --  105  --  100  101 100 101  CO2 20* 19*  --  18*  --  18*  20* 17* 19*  GLUCOSE 132* 107*  --  98  --  208*  209* 201* 90  BUN 35* 36*  --  38*  --  35*  34* 33* 29*  CREATININE 9.84* 10.21*  --  10.37*  --  8.88*  8.86* 7.91* 7.68*  CALCIUM 7.1* 7.3*  --  7.2*  --  7.5*  7.5* 7.2* 7.1*  MG 1.8  --  2.0 1.9  --  1.9 1.9  --   PHOS  --  4.2 4.2  --   --  3.6 3.3 3.0    GFR: Estimated Creatinine Clearance: 8.9 mL/min (A) (by C-G formula based on SCr of 7.68 mg/dL (H)).  Liver Function Tests: Recent Labs  Lab 07/04/23 1542 07/05/23 0113 07/06/23 0325 07/07/23 0520 07/08/23 0608  AST 10*  --   --   --   --   ALT 10  --   --   --   --   ALKPHOS 84  --   --   --   --   BILITOT 0.6  --   --   --   --   PROT 5.4*  --   --   --   --   ALBUMIN 1.7* <1.5* <1.5* <1.5* <1.5*   No results for input(s): "LIPASE", "AMYLASE" in the last 168 hours. No results for input(s): "AMMONIA" in the last 168 hours.  Coagulation Profile:  Recent Labs  Lab 07/04/23 1542  INR 1.4*    Cardiac Enzymes: No results for input(s): "CKTOTAL", "CKMB", "CKMBINDEX", "TROPONINI" in the last 168 hours.  BNP (last 3 results) No results for input(s): "PROBNP" in the last 8760 hours.  Lipid Profile: No results for input(s): "CHOL", "HDL", "LDLCALC", "TRIG", "CHOLHDL", "LDLDIRECT" in the last 72 hours.  Thyroid Function Tests: No results for input(s): "TSH",  "T4TOTAL", "FREET4", "T3FREE", "THYROIDAB" in the last 72 hours.  Anemia Panel: No results for input(s): "VITAMINB12", "FOLATE", "FERRITIN", "TIBC", "IRON", "RETICCTPCT" in the last 72 hours.  Urine analysis:    Component Value Date/Time   COLORURINE YELLOW 10/07/2021 1325   APPEARANCEUR TURBID (A) 10/07/2021 1325   LABSPEC 1.011 10/07/2021 1325   PHURINE 5.0 10/07/2021 1325   GLUCOSEU 50 (A) 10/07/2021 1325   HGBUR SMALL (A) 10/07/2021 1325   BILIRUBINUR NEGATIVE 10/07/2021 1325   KETONESUR NEGATIVE 10/07/2021 1325   PROTEINUR 100 (A) 10/07/2021 1325   UROBILINOGEN 0.2 01/07/2015 1723   NITRITE NEGATIVE 10/07/2021 1325   LEUKOCYTESUR LARGE (A) 10/07/2021 1325    Sepsis Labs: Lactic Acid, Venous    Component Value Date/Time   LATICACIDVEN 0.7 07/04/2023 2109    MICROBIOLOGY: Recent Results (from the past 240 hours)  Culture, blood (Routine x 2)     Status: Abnormal   Collection Time: 07/04/23  3:42 PM   Specimen: BLOOD  Result Value Ref Range Status   Specimen Description BLOOD SITE NOT SPECIFIED  Final   Special Requests   Final    BOTTLES DRAWN AEROBIC AND ANAEROBIC Blood Culture results may not be optimal due to an inadequate volume of blood received in culture bottles   Culture  Setup Time   Final    GRAM POSITIVE COCCI IN CLUSTERS AEROBIC BOTTLE ONLY CRITICAL RESULT CALLED TO, READ BACK BY AND VERIFIED WITH: PHARMD ADaphane Shepherd [id number] @ 1604 FH Performed at [facility], [address], [place], [place] [address]    Culture STAPHYLOCOCCUS AUREUS (A)  Final   Report Status 07/07/2023 FINAL  Final   Organism ID, Bacteria STAPHYLOCOCCUS AUREUS  Final      Susceptibility   Staphylococcus aureus - MIC*    CIPROFLOXACIN <=0.5 SENSITIVE Sensitive     ERYTHROMYCIN <=0.25 SENSITIVE Sensitive     GENTAMICIN <=0.5 SENSITIVE Sensitive     OXACILLIN <=0.25 SENSITIVE Sensitive     TETRACYCLINE <=1 SENSITIVE Sensitive     VANCOMYCIN 1 SENSITIVE Sensitive      TRIMETH/SULFA <=10 SENSITIVE Sensitive     CLINDAMYCIN <=0.25 SENSITIVE Sensitive     RIFAMPIN <=0.5 SENSITIVE Sensitive     Inducible Clindamycin NEGATIVE Sensitive     LINEZOLID 2 SENSITIVE Sensitive     * STAPHYLOCOCCUS AUREUS  Blood Culture ID Panel (Reflexed)     Status: Abnormal   Collection Time: 07/04/23  3:42 PM  Result Value Ref Range Status   Enterococcus faecalis NOT DETECTED NOT DETECTED Final   Enterococcus Faecium NOT DETECTED NOT DETECTED Final   Listeria monocytogenes NOT DETECTED NOT DETECTED Final   Staphylococcus species DETECTED (A) NOT DETECTED Final    Comment: CRITICAL RESULT CALLED TO, READ BACK BY AND VERIFIED WITH: PHARMD A. MEYER [id number] @ 1604 FH    Staphylococcus aureus (BCID) DETECTED (A) NOT DETECTED Final    Comment: CRITICAL RESULT CALLED TO, READ BACK BY AND VERIFIED WITH: PHARMD A. MEYER [id number] @ 1604 FH    Staphylococcus epidermidis NOT DETECTED NOT DETECTED Final   Staphylococcus lugdunensis NOT  DETECTED NOT DETECTED Final   Streptococcus species NOT DETECTED NOT DETECTED Final   Streptococcus agalactiae NOT DETECTED NOT DETECTED Final   Streptococcus pneumoniae NOT DETECTED NOT DETECTED Final   Streptococcus pyogenes NOT DETECTED NOT DETECTED Final   A.calcoaceticus-baumannii NOT DETECTED NOT DETECTED Final   Bacteroides fragilis NOT DETECTED NOT DETECTED Final   Enterobacterales NOT DETECTED NOT DETECTED Final   Enterobacter cloacae complex NOT DETECTED NOT DETECTED Final   Escherichia coli NOT DETECTED NOT DETECTED Final   Klebsiella aerogenes NOT DETECTED NOT DETECTED Final   Klebsiella oxytoca NOT DETECTED NOT DETECTED Final   Klebsiella pneumoniae NOT DETECTED NOT DETECTED Final   Proteus species NOT DETECTED NOT DETECTED Final   Salmonella species NOT DETECTED NOT DETECTED Final   Serratia marcescens NOT DETECTED NOT DETECTED Final   Haemophilus influenzae NOT DETECTED NOT DETECTED Final   Neisseria meningitidis NOT DETECTED NOT  DETECTED Final   Pseudomonas aeruginosa NOT DETECTED NOT DETECTED Final   Stenotrophomonas maltophilia NOT DETECTED NOT DETECTED Final   Candida albicans NOT DETECTED NOT DETECTED Final   Candida auris NOT DETECTED NOT DETECTED Final   Candida glabrata NOT DETECTED NOT DETECTED Final   Candida krusei NOT DETECTED NOT DETECTED Final   Candida parapsilosis NOT DETECTED NOT DETECTED Final   Candida tropicalis NOT DETECTED NOT DETECTED Final   Cryptococcus neoformans/gattii NOT DETECTED NOT DETECTED Final   Meth resistant mecA/C and MREJ NOT DETECTED NOT DETECTED Final    Comment: Performed at [facility], [address] [place], [place] [address]  Resp panel by RT-PCR (RSV, Flu A&B, Covid) Anterior Nasal Swab     Status: None   Collection Time: 07/04/23  3:46 PM   Specimen: Anterior Nasal Swab  Result Value Ref Range Status   SARS Coronavirus 2 by RT PCR NEGATIVE NEGATIVE Final   Influenza A by PCR NEGATIVE NEGATIVE Final   Influenza B by PCR NEGATIVE NEGATIVE Final    Comment: (NOTE) The Xpert Xpress SARS-CoV-2/FLU/RSV plus assay is intended as an aid in the diagnosis of influenza from Nasopharyngeal swab specimens and should not be used as a sole basis for treatment. Nasal washings and aspirates are unacceptable for Xpert Xpress SARS-CoV-2/FLU/RSV testing.  Fact Sheet for Patients: BloggerCourse.com  Fact Sheet for Healthcare Providers: SeriousBroker.it  This test is not yet approved or cleared by the [place] FDA and has been authorized for detection and/or diagnosis of SARS-CoV-2 by FDA under an Emergency Use Authorization (EUA). This EUA will remain in effect (meaning this test can be used) for the duration of the COVID-19 declaration under Section 564(b)(1) of the Act, 21 U.S.C. section 360bbb-3(b)(1), unless the authorization is terminated or revoked.     Resp Syncytial Virus by PCR NEGATIVE NEGATIVE Final     Comment: (NOTE) Fact Sheet for Patients: BloggerCourse.com  Fact Sheet for Healthcare Providers: SeriousBroker.it  This test is not yet approved or cleared by the [place] FDA and has been authorized for detection and/or diagnosis of SARS-CoV-2 by FDA under an Emergency Use Authorization (EUA). This EUA will remain in effect (meaning this test can be used) for the duration of the COVID-19 declaration under Section 564(b)(1) of the Act, 21 U.S.C. section 360bbb-3(b)(1), unless the authorization is terminated or revoked.  Performed at [facility], [address] [place], [place] [address]   Body fluid culture w Gram Stain     Status: None   Collection Time: 07/04/23  7:34 PM   Specimen: Peritoneal  Dialysate; Body Fluid  Result Value Ref Range Status   Specimen Description PERITONEAL DIALYSATE ABDOMEN  Final   Special Requests NONE  Final   Gram Stain   Final    ABUNDANT WBC PRESENT, PREDOMINANTLY PMN NO ORGANISMS SEEN Performed at [facility], [address] [place], [place] [address]    Culture MODERATE STAPHYLOCOCCUS AUREUS  Final   Report Status 07/06/2023 FINAL  Final   Organism ID, Bacteria STAPHYLOCOCCUS AUREUS  Final      Susceptibility   Staphylococcus aureus - MIC*    CIPROFLOXACIN <=0.5 SENSITIVE Sensitive     ERYTHROMYCIN <=0.25 SENSITIVE Sensitive     GENTAMICIN <=0.5 SENSITIVE Sensitive     OXACILLIN <=0.25 SENSITIVE Sensitive     TETRACYCLINE <=1 SENSITIVE Sensitive     VANCOMYCIN 1 SENSITIVE Sensitive     TRIMETH/SULFA <=10 SENSITIVE Sensitive     CLINDAMYCIN <=0.25 SENSITIVE Sensitive     RIFAMPIN <=0.5 SENSITIVE Sensitive     Inducible Clindamycin NEGATIVE Sensitive     LINEZOLID 2 SENSITIVE Sensitive     * MODERATE STAPHYLOCOCCUS AUREUS  Culture, blood (Routine x 2)     Status: None (Preliminary result)   Collection Time: 07/04/23  9:08 PM   Specimen: BLOOD LEFT ARM  Result Value Ref  Range Status   Specimen Description BLOOD LEFT ARM  Final   Special Requests   Final    BOTTLES DRAWN AEROBIC AND ANAEROBIC Blood Culture results may not be optimal due to an inadequate volume of blood received in culture bottles   Culture   Final    NO GROWTH 4 DAYS Performed at [facility], [address] [place], [place] [address]    Report Status PENDING  Incomplete  MRSA Next Gen by PCR, Nasal     Status: None   Collection Time: 07/05/23 12:12 AM   Specimen: Nasal Mucosa; Nasal Swab  Result Value Ref Range Status   MRSA by PCR Next Gen NOT DETECTED NOT DETECTED Final    Comment: (NOTE) The GeneXpert MRSA Assay (FDA approved for NASAL specimens only), is one component of a comprehensive MRSA colonization surveillance program. It is not intended to diagnose MRSA infection nor to guide or monitor treatment for MRSA infections. Test performance is not FDA approved in patients less than 24 years old. Performed at [facility], [address], [place], [place] [address]   Body fluid culture w Gram Stain     Status: None   Collection Time: 07/05/23  7:19 AM   Specimen: Peritoneal Washings; Body Fluid  Result Value Ref Range Status   Specimen Description PERITONEAL  Final   Special Requests NONE  Final   Gram Stain   Final    ABUNDANT WBC PRESENT, PREDOMINANTLY PMN RARE GRAM POSITIVE COCCI Performed at [facility], [address] [place], [place] [address]    Culture FEW STAPHYLOCOCCUS AUREUS  Final   Report Status 07/07/2023 FINAL  Final   Organism ID, Bacteria STAPHYLOCOCCUS AUREUS  Final      Susceptibility   Staphylococcus aureus - MIC*    CIPROFLOXACIN <=0.5 SENSITIVE Sensitive     ERYTHROMYCIN <=0.25 SENSITIVE Sensitive     GENTAMICIN <=0.5 SENSITIVE Sensitive     OXACILLIN 0.5 SENSITIVE Sensitive     TETRACYCLINE <=1 SENSITIVE Sensitive     VANCOMYCIN 1 SENSITIVE Sensitive     TRIMETH/SULFA <=10 SENSITIVE Sensitive     CLINDAMYCIN <=0.25  SENSITIVE Sensitive  RIFAMPIN <=0.5 SENSITIVE Sensitive     Inducible Clindamycin NEGATIVE Sensitive     LINEZOLID 2 SENSITIVE Sensitive     * FEW STAPHYLOCOCCUS AUREUS  Culture, blood (Routine X 2) w Reflex to ID Panel     Status: None (Preliminary result)   Collection Time: 07/07/23  5:20 AM   Specimen: BLOOD LEFT HAND  Result Value Ref Range Status   Specimen Description BLOOD LEFT HAND  Final   Special Requests   Final    BOTTLES DRAWN AEROBIC ONLY Blood Culture results may not be optimal due to an inadequate volume of blood received in culture bottles   Culture   Final    NO GROWTH 1 DAY Performed at [facility], [address], [place], [place] [address]    Report Status PENDING  Incomplete  Culture, blood (Routine X 2) w Reflex to ID Panel     Status: None (Preliminary result)   Collection Time: 07/07/23  5:27 AM   Specimen: BLOOD LEFT ARM  Result Value Ref Range Status   Specimen Description BLOOD LEFT ARM  Final   Special Requests   Final    BOTTLES DRAWN AEROBIC AND ANAEROBIC Blood Culture adequate volume   Culture   Final    NO GROWTH 1 DAY Performed at [facility], [address] [place], [place] [address]    Report Status PENDING  Incomplete    RADIOLOGY STUDIES/RESULTS: CT CHEST WO CONTRAST Result Date: 07/07/2023 CLINICAL DATA:  Dyspnea, chronic, unclear etiology Echo showing extrinsic compression of the left atrium-chest x-ray unrevealing-please assess with CT chest. EXAM: CT CHEST WITHOUT CONTRAST TECHNIQUE: Multidetector CT imaging of the chest was performed following the standard protocol without IV contrast. Evaluation of the heart, vascular structures, and soft tissues is limited without IV contrast. RADIATION DOSE REDUCTION: This exam was performed according to the departmental dose-optimization program which includes automated exposure control, adjustment of the mA and/or kV according to patient size and/or use of iterative reconstruction  technique. COMPARISON:  07/04/2023, 10/27/2019 FINDINGS: Cardiovascular: Unenhanced imaging of the heart demonstrates cardiomegaly without pericardial effusion. Evaluation of the cardiac chambers is limited without IV contrast. Normal caliber of the thoracic aorta. Vascular lumen cannot be evaluated without IV contrast. Mediastinum/Nodes: No enlarged mediastinal or axillary lymph nodes. Thyroid gland, trachea, and esophagus demonstrate no significant findings. Lungs/Pleura: Trace left pleural effusion. Scattered areas of consolidation within the dependent left upper lobe and left lower lobe, unchanged since prior study and compatible with early airspace disease or atelectasis. Right chest is clear. No pneumothorax. Central airways are patent. Upper Abdomen: Trace fluid within the upper abdomen, decreased since prior study. Musculoskeletal: No acute or destructive bony lesions. IMPRESSION: 1. Cardiomegaly without pericardial effusion. Evaluation of the heart is limited without IV contrast. There is no abnormality identified on this unenhanced exam resulting in extrinsic compression of the left atrium as reported in the clinical history. 2. Stable patchy left basilar consolidation and small left effusion, favor atelectasis over airspace disease. 3. Trace upper abdominal free fluid. Electronically Signed   By: Sharlet Salina M.D.   On: 07/07/2023 16:19     LOS: 4 days   Jeoffrey Massed, MD  [facility]    To contact the attending provider between 7A-7P or the covering provider during after hours 7P-7A, please log into the web site [email or web address] and access using universal [place] password for that web site. If you do not have the password, please call the hospital operator.  07/08/2023, 11:44 AM

## 2023-07-08 NOTE — Anesthesia Postprocedure Evaluation (Signed)
 Anesthesia Post Note  Patient: [employer]  Procedure(s) Performed: TRANSESOPHAGEAL ECHOCARDIOGRAM     Patient location during evaluation: PACU Anesthesia Type: MAC Level of consciousness: awake and alert Pain management: pain level controlled Vital Signs Assessment: post-procedure vital signs reviewed and stable Respiratory status: spontaneous breathing, nonlabored ventilation, respiratory function stable and patient connected to nasal cannula oxygen Cardiovascular status: stable and blood pressure returned to baseline Postop Assessment: no apparent nausea or vomiting Anesthetic complications: no   No notable events documented.  Last Vitals:  Vitals:   07/08/23 1530 07/08/23 1535  BP: (!) 85/55 109/69  Pulse: 85 91  Resp: 10 16  Temp:    SpO2: 98% 98%    Last Pain:  Vitals:   07/08/23 1525  TempSrc: Temporal  PainSc: Asleep                 Nelle Don Severn Goddard

## 2023-07-08 NOTE — Progress Notes (Addendum)
 [facility] Progress Note  Name: Lindsey Murillo MRN: [id number] DOB: Oct 02, 1995  Chief Complaint:  Abdominal pain  Subjective:  She has had no UOP charted over 2/27.  See a note that there was fibrin and blood present in effluent on 2/26 am per PD RN note.  Spoke with the patient and her mother at bedside.  When PD is going well then she really likes it.  She would choose PD over hemo but doesn't like the abdominal pain right now.  Her mother thinks that she needs to go back in-center for HD and she would be willing to do this if needed.  Her AVF works.   UF: Overnight 2/27-2/28 - 306 mL (gain) overnight 2/26 - 2/27 - negative 215 mL (gain)  overnight 2/25 - 2/26 - no total UF is charted   Review of systems:      No chest pain She has had some nausea No shortness of breath but abd pain when takes a deep breath Does make some urine  She is having diarrhea ------------- Background on consult:  Lindsey Murillo is a 28 y.o. female with a PMH significant for type 1 DM,  HTN, autoimmune thyroiditis, and ESRD on CCPD who presented to [facility] ED today after developing abdominal pain this morning.  She has not been feeling well and has had N/V/D for the past 4-5 days as well as a cough.  She denies any cloudy PD fluid and reports that her abdominal pain just started today.  In the ED, temp max 100.7, bp 82/46, HR 110, SpO2 93% on room air.  Labs notable for WBC 10, Hgb 10.5, plt 244, Na 134, K 2.6, Co2 17, BUN 36, Cr 10.6, Ca 7.8, alb 1.7.  Presentation concerning for SBP and we were consulted to manage her PD and send off PD fluid for studies during her hospitalization.  Of note, she was not doing her PD correctly for the past week and was 10 kg above edw.  She was not doing the proper amount of exchanges and was reabsorbing.  She declined coming in for HD session to help with her volume, however she declined and performed CAPD at home.  She now appears hypovolemic due to vomiting  and diarrhea which predated her abdominal pain and may have a viral gastroenteritis.  Outpatient PD RN reports pt on 6 exchanges over night with 2.4 fills.  Dwell time 1.5 hours.  EDW is 50 kg. She was just changed from CAPD to CCPD.  Mircera 50 mcg on 2/19 and receives this every two weeks usually or her hemoglobin will drop   Spoke with outpatient PD RN.  Patient has worked very hard to be able to do PD (got her own place, got her blood sugar under control) however recently she has been sick often. She has previously stated uses 1.5% dextrose often at home.  She states that she feels like her vision/eyes are at baseline.   Intake/Output Summary (Last 24 hours) at 07/08/2023 0837 Last data filed at 07/08/2023 0644 Gross per 24 hour  Intake 91 ml  Output 0 ml  Net 91 ml    Vitals:  Vitals:   07/07/23 1938 07/07/23 2300 07/08/23 0500 07/08/23 0513  BP: 126/85 (!) 133/98  129/88  Pulse:    91  Resp:    14  Temp: 97.9 F (36.6 C) 97.8 F (36.6 C)  98.5 F (36.9 C)  TempSrc: Oral Oral  Oral  SpO2: 97%  97%  Weight:   51.7 kg   Height:         Physical Exam:     General adult female in bed in no acute distress HEENT normocephalic atraumatic extraocular movements intact sclera anicteric Neck supple trachea midline Lungs clear to auscultation bilaterally normal work of breathing at rest on room air Heart S1S2 no rub Abdomen soft mildly tender to palpation; distended; PD catheter in place  Extremities no edema lower extremities Psych normal mood and affect Neuro alert and oriented x 3 provides hx follows commands Access: PD catheter as above.  RUE AVF with bruit and thrill   Medications reviewed   Labs:     Latest Ref Rng & Units 07/08/2023    6:08 AM 07/07/2023    5:20 AM 07/06/2023    3:25 AM  BMP  Glucose 70 - 99 mg/dL 90  161  096    045   BUN 6 - 20 mg/dL 29  33  34    35   Creatinine 0.44 - 1.00 mg/dL 4.09  8.11  9.14    7.82   Sodium 135 - 145 mmol/L 134  131  134     132   Potassium 3.5 - 5.1 mmol/L 3.2  3.3  3.6    3.5   Chloride 98 - 111 mmol/L 101  100  101    100   CO2 22 - 32 mmol/L 19  17  20    18    Calcium 8.9 - 10.3 mg/dL 7.1  7.2  7.5    7.5      Assessment/Plan:   PD-associated Bacterial peritonitis -  Moderate staph aureus on PD fluid culture Repeat PD fluid cell count today and daily for now - the 2/25 cell count doesn't correlate with the 2/26 cell count.  Note that the most recent cell count was sent on 2/28 AM   On cefazolin  If cell count does not improve or if ID feels is appropriate then may need tunneled catheter removed - patient is willing to do this if needed ESRD -   Will plan on CCPD tonight using a mix of 1.5% and 2.5% bags for now   Add heparin to bags for hx fibrin. Spoke with pharmacy Ordered strict ins/outs  Note that she does have an AVF as well  Staph aureus bacteremia - on cefazolin.  Note blood cultures drawn today   Type 1 DM - per primary team Hypertension/volume  - despite trace pretibial edema and being above edw, she appeared volume depleted intravascularly and was hypotensive on arrival. PD as above. She is on midodrine 5 mg TID  Anemia of CKD - Hgb acceptable.  Metabolic bone disease -  phos controlled. On sensipar and calcitriol.  Corrected calcium is ok but if drops stop sensipar.  Hypokalemia - likely from GI losses.  on potassium 20 meq BID.  Will give an additional 20 meq PO potassium today for a total of 60 meq once   Disposition - continue inpatient monitoring   Estanislado Emms, MD 07/08/2023 9:11 AM   She is NPO and asked about her diet.  When taking PO she is ok for a low phos diet from my standpoint.  Potassium has been low  Estanislado Emms, MD 10:37 AM 07/08/2023

## 2023-07-08 NOTE — Interval H&P Note (Signed)
 History and Physical Interval Note:  07/08/2023 2:33 PM  [name]  has presented today for surgery, with the diagnosis of bacteremia.  The various methods of treatment have been discussed with the patient and family. After consideration of risks, benefits and other options for treatment, the patient has consented to  Procedure(s): TRANSESOPHAGEAL ECHOCARDIOGRAM (N/A) as a surgical intervention.  The patient's history has been reviewed, patient examined, no change in status, stable for surgery.  I have reviewed the patient's chart and labs.  Questions were answered to the patient's satisfaction.     Lindsey Murillo

## 2023-07-08 NOTE — Progress Notes (Signed)
 Due for consent. Mother in room and stated she will sign for daughter but wishes to speak with provider to explain procedure first. She states her son was here when procedure was initially explained and she wishes for it to be explained to her prior to procedure.

## 2023-07-08 NOTE — Plan of Care (Signed)

## 2023-07-08 NOTE — Progress Notes (Signed)
 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 retinal detachment  Ophthalmology exam no concern of endophthalmitis 2/26 tte no obvious veg  2/28 tee no vegetation  Dialysate study today showed significant improved wbc count and becoming clear in color  Abd pain improving       PLAN: Discussed with renal --> will go probably up to 7 days to allow clearance of dialysate to meet cure criteria given trajectory of cell count improvement No endocarditis on echo Will do 4 weeks systemic antibiotics Standard precaution for isolation control  Principal Problem:   Septic shock ([name]) Active Problems:   Dialysis-associated peritonitis ([name])   Pressure injury of skin   No Known Allergies  Scheduled Meds:  [MAR Hold] calcitRIOL  1 mcg Oral Daily   [MAR Hold] Chlorhexidine Gluconate Cloth  6 each Topical Daily   [MAR Hold] cinacalcet  30 mg Oral Q breakfast   dextrose  25 mL Intravenous Once   [MAR Hold] famotidine  10 mg Oral Daily   [MAR Hold] gentamicin cream  1 Application Topical Daily   [MAR Hold] heparin  5,000 Units Subcutaneous Q8H   [MAR Hold] insulin aspart  0-6 Units Subcutaneous TID WC   [MAR Hold] insulin glargine  3 Units Subcutaneous QHS   [MAR Hold] midodrine  5 mg Oral TID WC   [MAR Hold] multivitamin  1 tablet Oral Daily   [MAR Hold] potassium chloride  20 mEq Oral BID   [MAR Hold] potassium chloride  20 mEq Oral Once   [MAR Hold] rosuvastatin  10 mg Oral Daily   Continuous Infusions:  [MAR Hold]  ceFAZolin (ANCEF) IV 1 g (07/07/23 2012)   [MAR Hold] heparin sodium (porcine) 1,500 Units in dialysis solution 1.5% low-MG/low-CA 3,000 mL dialysis solution     [MAR Hold] heparin sodium (porcine) 1,500 Units in dialysis solution 2.5% low-MG/low-CA 3,000 mL dialysis solution     PRN Meds:.[MAR Hold]  acetaminophen, [MAR Hold] docusate sodium, [MAR Hold]  HYDROmorphone (DILAUDID) injection, [MAR Hold] ondansetron (ZOFRAN) IV, [MAR Hold] mouth rinse, [MAR Hold] polyethylene glycol, [MAR Hold] polyvinyl alcohol, [MAR Hold] prochlorperazine   SUBJECTIVE: Improving pain No new pain No f/c Dialysate clearing  Review of Systems: ROS All other ROS was negative, except mentioned above     OBJECTIVE: Vitals:   07/08/23 1525 07/08/23 1526 07/08/23 1530 07/08/23 1535  BP: (!) 90/57 (!) 80/52 (!) 85/55 109/69  Pulse: 83 86 85 91  Resp: 12 11 10 16   Temp: (!) 97.5 F (36.4 C)     TempSrc: Temporal     SpO2: 97% 96% 98% 98%  Weight:      Height:       Body mass index is 17.33 kg/m.  Physical Exam General/constitutional: no distress, pleasant HEENT: Normocephalic, PER, Conj stable mild redness left conj, EOMI, Oropharynx clear Neck supple CV: rrr no mrg Lungs: clear to auscultation, normal respiratory effort Abd: Soft, mild tenderness; pd site no erythema/purulence Ext: no edema Skin: No Rash Neuro: nonfocal MSK: no peripheral joint swelling/tenderness/warmth; back spines nontender    Lab Results Lab Results  Component Value Date   WBC 13.8 (H) 07/08/2023   HGB 10.5 (L) 07/08/2023   HCT 30.7 (L) 07/08/2023   MCV 86.5 07/08/2023   PLT 211 07/08/2023    Lab Results  Component  Value Date   CREATININE 7.68 (H) 07/08/2023   BUN 29 (H) 07/08/2023   NA 134 (L) 07/08/2023   K 3.2 (L) 07/08/2023   CL 101 07/08/2023   CO2 19 (L) 07/08/2023    Lab Results  Component Value Date   ALT 10 07/04/2023   AST 10 (L) 07/04/2023   ALKPHOS 84 07/04/2023   BILITOT 0.6 07/04/2023      Microbiology: Recent Results (from the past 240 hours)  Culture, blood (Routine x 2)     Status: Abnormal   Collection Time: 07/04/23  3:42 PM   Specimen: BLOOD  Result Value Ref Range Status   Specimen Description BLOOD SITE NOT SPECIFIED  Final   Special Requests   Final    BOTTLES DRAWN  AEROBIC AND ANAEROBIC Blood Culture results may not be optimal due to an inadequate volume of blood received in culture bottles   Culture  Setup Time   Final    GRAM POSITIVE COCCI IN CLUSTERS AEROBIC BOTTLE ONLY CRITICAL RESULT CALLED TO, READ BACK BY AND VERIFIED WITH: PHARMD ADaphane Shepherd [id number] @ 1604 FH Performed at [facility], [address], [place], [place] [address]    Culture STAPHYLOCOCCUS AUREUS (A)  Final   Report Status 07/07/2023 FINAL  Final   Organism ID, Bacteria STAPHYLOCOCCUS AUREUS  Final      Susceptibility   Staphylococcus aureus - MIC*    CIPROFLOXACIN <=0.5 SENSITIVE Sensitive     ERYTHROMYCIN <=0.25 SENSITIVE Sensitive     GENTAMICIN <=0.5 SENSITIVE Sensitive     OXACILLIN <=0.25 SENSITIVE Sensitive     TETRACYCLINE <=1 SENSITIVE Sensitive     VANCOMYCIN 1 SENSITIVE Sensitive     TRIMETH/SULFA <=10 SENSITIVE Sensitive     CLINDAMYCIN <=0.25 SENSITIVE Sensitive     RIFAMPIN <=0.5 SENSITIVE Sensitive     Inducible Clindamycin NEGATIVE Sensitive     LINEZOLID 2 SENSITIVE Sensitive     * STAPHYLOCOCCUS AUREUS  Blood Culture ID Panel (Reflexed)     Status: Abnormal   Collection Time: 07/04/23  3:42 PM  Result Value Ref Range Status   Enterococcus faecalis NOT DETECTED NOT DETECTED Final   Enterococcus Faecium NOT DETECTED NOT DETECTED Final   Listeria monocytogenes NOT DETECTED NOT DETECTED Final   Staphylococcus species DETECTED (A) NOT DETECTED Final    Comment: CRITICAL RESULT CALLED TO, READ BACK BY AND VERIFIED WITH: PHARMD A. MEYER [id number] @ 1604 FH    Staphylococcus aureus (BCID) DETECTED (A) NOT DETECTED Final    Comment: CRITICAL RESULT CALLED TO, READ BACK BY AND VERIFIED WITH: PHARMD A. MEYER [id number] @ 1604 FH    Staphylococcus epidermidis NOT DETECTED NOT DETECTED Final   Staphylococcus lugdunensis NOT DETECTED NOT DETECTED Final   Streptococcus species NOT DETECTED NOT DETECTED Final   Streptococcus agalactiae NOT DETECTED NOT DETECTED Final    Streptococcus pneumoniae NOT DETECTED NOT DETECTED Final   Streptococcus pyogenes NOT DETECTED NOT DETECTED Final   A.calcoaceticus-baumannii NOT DETECTED NOT DETECTED Final   Bacteroides fragilis NOT DETECTED NOT DETECTED Final   Enterobacterales NOT DETECTED NOT DETECTED Final   Enterobacter cloacae complex NOT DETECTED NOT DETECTED Final   Escherichia coli NOT DETECTED NOT DETECTED Final   Klebsiella aerogenes NOT DETECTED NOT DETECTED Final   Klebsiella oxytoca NOT DETECTED NOT DETECTED Final   Klebsiella pneumoniae NOT DETECTED NOT DETECTED Final   Proteus species NOT DETECTED NOT DETECTED Final   Salmonella species NOT DETECTED NOT DETECTED Final   Serratia marcescens  NOT DETECTED NOT DETECTED Final   Haemophilus influenzae NOT DETECTED NOT DETECTED Final   Neisseria meningitidis NOT DETECTED NOT DETECTED Final   Pseudomonas aeruginosa NOT DETECTED NOT DETECTED Final   Stenotrophomonas maltophilia NOT DETECTED NOT DETECTED Final   Candida albicans NOT DETECTED NOT DETECTED Final   Candida auris NOT DETECTED NOT DETECTED Final   Candida glabrata NOT DETECTED NOT DETECTED Final   Candida krusei NOT DETECTED NOT DETECTED Final   Candida parapsilosis NOT DETECTED NOT DETECTED Final   Candida tropicalis NOT DETECTED NOT DETECTED Final   Cryptococcus neoformans/gattii NOT DETECTED NOT DETECTED Final   Meth resistant mecA/C and MREJ NOT DETECTED NOT DETECTED Final    Comment: Performed at [facility], [address], [place], [place] [address]  Resp panel by RT-PCR (RSV, Flu A&B, Covid) Anterior Nasal Swab     Status: None   Collection Time: 07/04/23  3:46 PM   Specimen: Anterior Nasal Swab  Result Value Ref Range Status   SARS Coronavirus 2 by RT PCR NEGATIVE NEGATIVE Final   Influenza A by PCR NEGATIVE NEGATIVE Final   Influenza B by PCR NEGATIVE NEGATIVE Final    Comment: (NOTE) The Xpert Xpress SARS-CoV-2/FLU/RSV plus assay is intended as an aid in the diagnosis of  influenza from Nasopharyngeal swab specimens and should not be used as a sole basis for treatment. Nasal washings and aspirates are unacceptable for Xpert Xpress SARS-CoV-2/FLU/RSV testing.  Fact Sheet for Patients: BloggerCourse.com  Fact Sheet for Healthcare Providers: SeriousBroker.it  This test is not yet approved or cleared by the [place] FDA and has been authorized for detection and/or diagnosis of SARS-CoV-2 by FDA under an Emergency Use Authorization (EUA). This EUA will remain in effect (meaning this test can be used) for the duration of the COVID-19 declaration under Section 564(b)(1) of the Act, 21 U.S.C. section 360bbb-3(b)(1), unless the authorization is terminated or revoked.     Resp Syncytial Virus by PCR NEGATIVE NEGATIVE Final    Comment: (NOTE) Fact Sheet for Patients: BloggerCourse.com  Fact Sheet for Healthcare Providers: SeriousBroker.it  This test is not yet approved or cleared by the [place] FDA and has been authorized for detection and/or diagnosis of SARS-CoV-2 by FDA under an Emergency Use Authorization (EUA). This EUA will remain in effect (meaning this test can be used) for the duration of the COVID-19 declaration under Section 564(b)(1) of the Act, 21 U.S.C. section 360bbb-3(b)(1), unless the authorization is terminated or revoked.  Performed at [facility], [address], [place], [place] [address]   Body fluid culture w Gram Stain     Status: None   Collection Time: 07/04/23  7:34 PM   Specimen: Peritoneal Dialysate; Body Fluid  Result Value Ref Range Status   Specimen Description PERITONEAL DIALYSATE ABDOMEN  Final   Special Requests NONE  Final   Gram Stain   Final    ABUNDANT WBC PRESENT, PREDOMINANTLY PMN NO ORGANISMS SEEN Performed at [facility], [address], [place], [place] [address]    Culture  MODERATE STAPHYLOCOCCUS AUREUS  Final   Report Status 07/06/2023 FINAL  Final   Organism ID, Bacteria STAPHYLOCOCCUS AUREUS  Final      Susceptibility   Staphylococcus aureus - MIC*    CIPROFLOXACIN <=0.5 SENSITIVE Sensitive     ERYTHROMYCIN <=0.25 SENSITIVE Sensitive     GENTAMICIN <=0.5 SENSITIVE Sensitive     OXACILLIN <=0.25 SENSITIVE Sensitive     TETRACYCLINE <=1 SENSITIVE Sensitive  VANCOMYCIN 1 SENSITIVE Sensitive     TRIMETH/SULFA <=10 SENSITIVE Sensitive     CLINDAMYCIN <=0.25 SENSITIVE Sensitive     RIFAMPIN <=0.5 SENSITIVE Sensitive     Inducible Clindamycin NEGATIVE Sensitive     LINEZOLID 2 SENSITIVE Sensitive     * MODERATE STAPHYLOCOCCUS AUREUS  Culture, blood (Routine x 2)     Status: None (Preliminary result)   Collection Time: 07/04/23  9:08 PM   Specimen: BLOOD LEFT ARM  Result Value Ref Range Status   Specimen Description BLOOD LEFT ARM  Final   Special Requests   Final    BOTTLES DRAWN AEROBIC AND ANAEROBIC Blood Culture results may not be optimal due to an inadequate volume of blood received in culture bottles   Culture   Final    NO GROWTH 4 DAYS Performed at [facility], [address] [place], [place] [address]    Report Status PENDING  Incomplete  MRSA Next Gen by PCR, Nasal     Status: None   Collection Time: 07/05/23 12:12 AM   Specimen: Nasal Mucosa; Nasal Swab  Result Value Ref Range Status   MRSA by PCR Next Gen NOT DETECTED NOT DETECTED Final    Comment: (NOTE) The GeneXpert MRSA Assay (FDA approved for NASAL specimens only), is one component of a comprehensive MRSA colonization surveillance program. It is not intended to diagnose MRSA infection nor to guide or monitor treatment for MRSA infections. Test performance is not FDA approved in patients less than 55 years old. Performed at [facility], [address] [place], [place] [address]   Body fluid culture w Gram Stain     Status: None   Collection Time: 07/05/23  7:19  AM   Specimen: Peritoneal Washings; Body Fluid  Result Value Ref Range Status   Specimen Description PERITONEAL  Final   Special Requests NONE  Final   Gram Stain   Final    ABUNDANT WBC PRESENT, PREDOMINANTLY PMN RARE GRAM POSITIVE COCCI Performed at [facility], [address] [place], [place] [address]    Culture FEW STAPHYLOCOCCUS AUREUS  Final   Report Status 07/07/2023 FINAL  Final   Organism ID, Bacteria STAPHYLOCOCCUS AUREUS  Final      Susceptibility   Staphylococcus aureus - MIC*    CIPROFLOXACIN <=0.5 SENSITIVE Sensitive     ERYTHROMYCIN <=0.25 SENSITIVE Sensitive     GENTAMICIN <=0.5 SENSITIVE Sensitive     OXACILLIN 0.5 SENSITIVE Sensitive     TETRACYCLINE <=1 SENSITIVE Sensitive     VANCOMYCIN 1 SENSITIVE Sensitive     TRIMETH/SULFA <=10 SENSITIVE Sensitive     CLINDAMYCIN <=0.25 SENSITIVE Sensitive     RIFAMPIN <=0.5 SENSITIVE Sensitive     Inducible Clindamycin NEGATIVE Sensitive     LINEZOLID 2 SENSITIVE Sensitive     * FEW STAPHYLOCOCCUS AUREUS  Culture, blood (Routine X 2) w Reflex to ID Panel     Status: None (Preliminary result)   Collection Time: 07/07/23  5:20 AM   Specimen: BLOOD LEFT HAND  Result Value Ref Range Status   Specimen Description BLOOD LEFT HAND  Final   Special Requests   Final    BOTTLES DRAWN AEROBIC ONLY Blood Culture results may not be optimal due to an inadequate volume of blood received in culture bottles   Culture   Final    NO GROWTH 1 DAY Performed at [facility], [address] [place], [place] [address]    Report  Status PENDING  Incomplete  Culture, blood (Routine X 2) w Reflex to ID Panel     Status: None (Preliminary result)   Collection Time: 07/07/23  5:27 AM   Specimen: BLOOD LEFT ARM  Result Value Ref Range Status   Specimen Description BLOOD LEFT ARM  Final   Special Requests   Final    BOTTLES DRAWN AEROBIC AND ANAEROBIC Blood Culture adequate volume   Culture   Final    NO GROWTH 1 DAY Performed  at [facility], [address] [place], [place] [address]    Report Status PENDING  Incomplete     Serology:   Imaging: If present, new imagings (plain films, ct scans, and mri) have been personally visualized and interpreted; radiology reports have been reviewed. Decision making incorporated into the Impression / Recommendations.  2/26 tte  1. Left ventricular ejection fraction, by estimation, is 50 to 55%. The  left ventricle has low normal function. The left ventricle has no regional  wall motion abnormalities. Left ventricular diastolic parameters are  consistent with Grade II diastolic  dysfunction (pseudonormalization).   2. Right ventricular systolic function is normal. The right ventricular  size is normal. Tricuspid regurgitation signal is inadequate for assessing  PA pressure.   3. There appears to be compression of the left atrium by structure  extrinsic to the heart. Would review other chest imaging.   4. Right atrial size was mildly dilated.   5. The mitral valve is normal in structure. No evidence of mitral valve  regurgitation. No evidence of mitral stenosis.   6. The aortic valve is tricuspid. Aortic valve regurgitation is not  visualized. No aortic stenosis is present.   7. In one view, there was concern for vegetation on pulmonary valve, but  reviewing other images, I suspect this was artifact.   8. IVC not visualized.   Raymondo Band, MD [facility] [phone] pager    07/08/2023, 4:06 PM

## 2023-07-09 DIAGNOSIS — N186 End stage renal disease: Secondary | ICD-10-CM | POA: Diagnosis not present

## 2023-07-09 DIAGNOSIS — R7881 Bacteremia: Secondary | ICD-10-CM | POA: Diagnosis not present

## 2023-07-09 DIAGNOSIS — T8571XS Infection and inflammatory reaction due to peritoneal dialysis catheter, sequela: Secondary | ICD-10-CM | POA: Diagnosis not present

## 2023-07-09 DIAGNOSIS — B9561 Methicillin susceptible Staphylococcus aureus infection as the cause of diseases classified elsewhere: Secondary | ICD-10-CM | POA: Diagnosis not present

## 2023-07-09 DIAGNOSIS — N2581 Secondary hyperparathyroidism of renal origin: Secondary | ICD-10-CM | POA: Diagnosis not present

## 2023-07-09 DIAGNOSIS — Z992 Dependence on renal dialysis: Secondary | ICD-10-CM | POA: Diagnosis not present

## 2023-07-09 DIAGNOSIS — R6521 Severe sepsis with septic shock: Secondary | ICD-10-CM | POA: Diagnosis not present

## 2023-07-09 LAB — CULTURE, BLOOD (ROUTINE X 2): Culture: NO GROWTH

## 2023-07-09 LAB — BODY FLUID CELL COUNT WITH DIFFERENTIAL
Lymphs, Fluid: 11 %
Monocyte-Macrophage-Serous Fluid: 24 % — ABNORMAL LOW (ref 50–90)
Neutrophil Count, Fluid: 65 % — ABNORMAL HIGH (ref 0–25)
Total Nucleated Cell Count, Fluid: 1100 uL — ABNORMAL HIGH (ref 0–1000)

## 2023-07-09 LAB — CBC
HCT: 28.7 % — ABNORMAL LOW (ref 36.0–46.0)
Hemoglobin: 9.7 g/dL — ABNORMAL LOW (ref 12.0–15.0)
MCH: 29.7 pg (ref 26.0–34.0)
MCHC: 33.8 g/dL (ref 30.0–36.0)
MCV: 87.8 fL (ref 80.0–100.0)
Platelets: 196 10*3/uL (ref 150–400)
RBC: 3.27 MIL/uL — ABNORMAL LOW (ref 3.87–5.11)
RDW: 19.9 % — ABNORMAL HIGH (ref 11.5–15.5)
WBC: 14.7 10*3/uL — ABNORMAL HIGH (ref 4.0–10.5)
nRBC: 0.2 % (ref 0.0–0.2)

## 2023-07-09 LAB — RENAL FUNCTION PANEL
Albumin: <1.5 g/dL — ABNORMAL LOW (ref 3.5–5.0)
Anion gap: 13 (ref 5–15)
BUN: 27 mg/dL — ABNORMAL HIGH (ref 6–20)
CO2: 20 mmol/L — ABNORMAL LOW (ref 22–32)
Calcium: 6.9 mg/dL — ABNORMAL LOW (ref 8.9–10.3)
Chloride: 100 mmol/L (ref 98–111)
Creatinine, Ser: 7.37 mg/dL — ABNORMAL HIGH (ref 0.44–1.00)
GFR, Estimated: 7 mL/min — ABNORMAL LOW (ref >60)
Glucose, Bld: 202 mg/dL — ABNORMAL HIGH (ref 70–99)
Phosphorus: 2.9 mg/dL (ref 2.5–4.6)
Potassium: 3 mmol/L — ABNORMAL LOW (ref 3.5–5.1)
Sodium: 133 mmol/L — ABNORMAL LOW (ref 135–145)

## 2023-07-09 LAB — ECHO TEE

## 2023-07-09 LAB — GLUCOSE, CAPILLARY
Glucose-Capillary: 110 mg/dL — ABNORMAL HIGH (ref 70–99)
Glucose-Capillary: 128 mg/dL — ABNORMAL HIGH (ref 70–99)
Glucose-Capillary: 190 mg/dL — ABNORMAL HIGH (ref 70–99)
Glucose-Capillary: 242 mg/dL — ABNORMAL HIGH (ref 70–99)

## 2023-07-09 MED ORDER — POTASSIUM CHLORIDE 20 MEQ PO PACK
40.0000 meq | PACK | Freq: Two times a day (BID) | ORAL | Status: DC
  Administered 2023-07-09 – 2023-07-11 (×5): 40 meq via ORAL
  Filled 2023-07-09 (×5): qty 2

## 2023-07-09 MED ORDER — SODIUM CHLORIDE 0.9% FLUSH
3.0000 mL | INTRAVENOUS | Status: DC | PRN
  Administered 2023-07-09: 10 mL via INTRAVENOUS

## 2023-07-09 MED ORDER — SODIUM CHLORIDE 0.9% FLUSH
3.0000 mL | Freq: Two times a day (BID) | INTRAVENOUS | Status: DC
  Administered 2023-07-09: 3 mL via INTRAVENOUS
  Administered 2023-07-09 – 2023-07-10 (×2): 10 mL via INTRAVENOUS
  Administered 2023-07-10: 3 mL via INTRAVENOUS
  Administered 2023-07-11: 10 mL via INTRAVENOUS
  Administered 2023-07-11: 3 mL via INTRAVENOUS

## 2023-07-09 MED ORDER — DARBEPOETIN ALFA 100 MCG/0.5ML IJ SOSY: 100.0000 ug | PREFILLED_SYRINGE | INTRAMUSCULAR | Status: DC

## 2023-07-09 MED ORDER — GENTAMICIN SULFATE 0.1 % EX CREA: 1.0000 | TOPICAL_CREAM | Freq: Every day | CUTANEOUS | Status: DC

## 2023-07-09 MED ORDER — DELFLEX-LC/1.5% DEXTROSE 344 MOSM/L IP SOLN: INTRAPERITONEAL | Status: DC

## 2023-07-09 NOTE — Progress Notes (Signed)
 [facility] Progress Note  Name: Lindsey Murillo MRN: [id number] DOB: [birth year]  Chief Complaint:  Abdominal pain  Subjective:  She had no UOP charted over 2/28.  Usually does make some urine.  She feels ok today.  She asks about her diet and I let her know ok to broaden to diabetic diet for now given the amount of potassium we're having to give her.  (Doesn't appear to be a low phos only option).  She is about to eat a plate of fries with ketchup.   UF:  Overnight 2/28-3/1 - 197 mL Overnight 2/27-2/28 - 306 mL (gain) overnight 2/26 - 2/27 - negative 215 mL (gain)  overnight 2/25 - 2/26 - no total UF is charted   Review of systems:      No chest pain  No shortness of breath  Does make some urine  She is having diarrhea still  ------------- Background on consult:  Lindsey Murillo is a 28 y.o. female with a PMH significant for type 1 DM,  HTN, autoimmune thyroiditis, and ESRD on CCPD who presented to [facility] ED today after developing abdominal pain this morning.  She has not been feeling well and has had N/V/D for the past 4-5 days as well as a cough.  She denies any cloudy PD fluid and reports that her abdominal pain just started today.  In the ED, temp max 100.7, bp 82/46, HR 110, SpO2 93% on room air.  Labs notable for WBC 10, Hgb 10.5, plt 244, Na 134, K 2.6, Co2 17, BUN 36, Cr 10.6, Ca 7.8, alb 1.7.  Presentation concerning for SBP and we were consulted to manage her PD and send off PD fluid for studies during her hospitalization.  Of note, she was not doing her PD correctly for the past week and was 10 kg above edw.  She was not doing the proper amount of exchanges and was reabsorbing.  She declined coming in for HD session to help with her volume, however she declined and performed CAPD at home.  She now appears hypovolemic due to vomiting and diarrhea which predated her abdominal pain and may have a viral gastroenteritis.  Outpatient PD RN reports pt on 6 exchanges  over night with 2.4 fills.  Dwell time 1.5 hours.  EDW is 50 kg. She was just changed from CAPD to CCPD.  Mircera 50 mcg on 2/19 and receives this every two weeks usually or her hemoglobin will drop   Spoke with outpatient PD RN.  Patient has worked very hard to be able to do PD (got her own place, got her blood sugar under control) however recently she has been sick often. She has previously stated uses 1.5% dextrose often at home.  She states that she feels like her vision/eyes are at baseline.   Intake/Output Summary (Last 24 hours) at 07/09/2023 1357 Last data filed at 07/09/2023 0604 Gross per 24 hour  Intake 150 ml  Output 197 ml  Net -47 ml    Vitals:  Vitals:   07/09/23 0500 07/09/23 0501 07/09/23 0800 07/09/23 1329  BP:  102/66 93/66   Pulse:  88 93 100  Resp:  17 18 14   Temp:  98.2 F (36.8 C) 98.2 F (36.8 C) 97.9 F (36.6 C)  TempSrc:  Oral Oral   SpO2:  98% 99%   Weight: 64.5 kg     Height:         Physical Exam:     General adult  female in bed in no acute distress HEENT normocephalic atraumatic extraocular movements intact sclera anicteric Neck supple trachea midline Lungs clear to auscultation bilaterally normal work of breathing at rest on room air Heart S1S2 no rub Abdomen soft non tender to palpation; PD catheter in place  Extremities no edema lower extremities Psych normal mood and affect Neuro alert and oriented x 3 provides hx follows commands Access: PD catheter as above.  RUE AVF with bruit and thrill   Medications reviewed   Labs:     Latest Ref Rng & Units 07/09/2023    6:02 AM 07/08/2023    6:08 AM 07/07/2023    5:20 AM  BMP  Glucose 70 - 99 mg/dL 161  90  096   BUN 6 - 20 mg/dL 27  29  33   Creatinine 0.44 - 1.00 mg/dL 0.45  4.09  8.11   Sodium 135 - 145 mmol/L 133  134  131   Potassium 3.5 - 5.1 mmol/L 3.0  3.2  3.3   Chloride 98 - 111 mmol/L 100  101  100   CO2 22 - 32 mmol/L 20  19  17    Calcium 8.9 - 10.3 mg/dL 6.9  7.1  7.2       Assessment/Plan:   PD-associated Bacterial peritonitis -  Moderate staph aureus on PD fluid culture Repeat PD fluid cell count today and daily for now.  On cefazolin  If ID feels is appropriate then may need tunneled catheter removed - patient is willing to do this if needed ESRD -   Will plan on CCPD tonight using all 1.5% dextrose Added heparin to bags for hx fibrin. Spoke with pharmacy Ordered strict ins/outs  Note that she does have an AVF as well  Staph aureus bacteremia - on cefazolin.  Cultures from 2/27 and 2/24 are pending (note some may not be adequate due to volume per lab note).  Type 1 DM - per primary team Hypertension/volume  - despite trace pretibial edema and being above edw, she appeared volume depleted intravascularly and was hypotensive on arrival. PD as above. She is on midodrine 5 mg TID  Anemia of CKD - trend Hb.  She got mircera 50 mcg on 2/19 and receives this every two weeks usually or her hemoglobin will drop  Ordered aranesp 100 mcg every Wednesday for 07/13/23 Metabolic bone disease -  phos controlled. On sensipar and calcitriol.  Stop sensipar for now due to hypocalcemia Hypokalemia - likely from GI losses. Increase potassium to 40 meq BID for now (the next day) and may need to titrate down after resolution of diarrhea.  Have broadened her diet as well.  Diarrhea - work-up and supportive care per primary team.    Disposition - continue inpatient monitoring   Estanislado Emms, MD 07/09/2023 2:22 PM

## 2023-07-09 NOTE — Progress Notes (Signed)
 Pharmacy Antibiotic Note  Lindsey Murillo is a 28 y.o. female admitted on 07/04/2023 with sepsis. BCID result MSSA  - discussed with ID and CCM Pharmacy has been consulted for cefazolin dosing.   ID following for MSSA bacteremia likely from PD catheter associated peritonitis. TEE on 2/28 showed no vegetation. WBC trending down from 20k to ~14k today. Patient afebrile.    Plan: Continue cefazolin 1g q24h F/u renal recs, infectious work up and length of therapy  Height: 5\' 8"  (172.7 cm) Weight: 64.5 kg (142 lb 3.2 oz) IBW/kg (Calculated) : 63.9  Temp (24hrs), Avg:97.5 F (36.4 C), Min:96.2 F (35.7 C), Max:98.2 F (36.8 C)  Recent Labs  Lab 07/04/23 1546 07/04/23 2109 07/04/23 2230 07/05/23 0754 07/06/23 0325 07/07/23 0520 07/08/23 0608 07/09/23 0602  WBC  --   --    < > 20.5* 15.8* 13.5* 13.8* 14.7*  CREATININE  --   --    < > 10.37* 8.88*  8.86* 7.91* 7.68* 7.37*  LATICACIDVEN 2.1* 0.7  --   --   --   --   --   --    < > = values in this interval not displayed.    Estimated Creatinine Clearance: 11.5 mL/min (A) (by C-G formula based on SCr of 7.37 mg/dL (H)).    No Known Allergies  Antimicrobials this admission: Cefepime 2/24 >2/25 Vancomycin 2/24 >2/25 Ceftazidime 2/25 Cefazolin 2/25>   Thank you for allowing pharmacy to be a part of this patient's care.  Blane Ohara, PharmD, BCPS PGY2 Pharmacy Resident

## 2023-07-09 NOTE — Plan of Care (Signed)
  Problem: Clinical Measurements: Goal: Ability to maintain clinical measurements within normal limits will improve Outcome: Progressing Goal: Will remain free from infection Outcome: Progressing   Problem: Activity: Goal: Risk for activity intolerance will decrease Outcome: Progressing   Problem: Pain Managment: Goal: General experience of comfort will improve and/or be controlled Outcome: Progressing   Problem: Safety: Goal: Ability to remain free from injury will improve Outcome: Progressing   Problem: Skin Integrity: Goal: Risk for impaired skin integrity will decrease Outcome: Progressing

## 2023-07-09 NOTE — Plan of Care (Signed)

## 2023-07-09 NOTE — Progress Notes (Signed)
 PROGRESS NOTE        PATIENT DETAILS Name: Lindsey Murillo Age: 28 y.o. Sex: female Date of Birth: Jul 21, 1995 Admit Date: 07/04/2023 Admitting Physician Madaline Brilliant, DO [id number], Verdis Frederickson, DO  Brief Summary: Patient is a 28 y.o.  female with history of ESRD on peritoneal dialysis, DM-1, HTN-who presented with abdominal pain-she was found to have septic shock in the setting of PD catheter associated peritonitis and MSSA bacteremia.  She was initially admitted to the ICU-required pressors-evaluated by ID/nephrology-placed on appropriate antimicrobial therapy-stabilized and subsequently transferred to [facility].  See below for further details.  Significant events: 2/25>> septic shock-PD catheter associated peritonitis/MSSA bacteremia-on Levophed 2/26 >> off Levophed 2/27>> transferred to [facility].  Significant studies: 2/24>> CXR: Strandy/patchy opacities left lower lobe-atelectasis versus pneumonia 2/24>> CT abdomen pelvis:  Fluid in the abdomen/pelvis, PD catheter in place 2/26>> echo: EF 50-55%, grade 2 diastolic dysfunction. 2/28>> TEE: No endocarditis.  Significant microbiology data: 2/24>> COVID/influenza/RSV PCR: Negative 2/24>> peritoneal fluid culture: MSSA 2/24>> blood culture: MSSA 2/25>> peritoneal fluid culture: MSSA 2/27>> blood culture: Negative  Procedures: None  Consults: PCCM Nephrology ID Opthalmology  Subjective: Much more awake/alert-minimal abdominal pain today.  1 small emesis overnight but mostly tolerating diet.  Feels much better than the past few days.  Objective: Vitals: Blood pressure 93/66, pulse 93, temperature 98.2 F (36.8 C), temperature source Oral, resp. rate 18, height 5\' 8"  (1.727 m), weight 64.5 kg, SpO2 99%.   Exam: Gen Exam:Alert awake-not in any distress HEENT:atraumatic, normocephalic Chest: B/L clear to auscultation anteriorly CVS:S1S2 regular Abdomen:soft non tender, non distended Extremities:no  edema Neurology: Non focal Skin: no rash  Pertinent Labs/Radiology:    Latest Ref Rng & Units 07/09/2023    6:02 AM 07/08/2023    6:08 AM 07/07/2023    5:20 AM  CBC  WBC 4.0 - 10.5 K/uL 14.7  13.8  13.5   Hemoglobin 12.0 - 15.0 g/dL 9.7  95.6  21.3   Hematocrit 36.0 - 46.0 % 28.7  30.7  29.9   Platelets 150 - 400 K/uL 196  211  205     Lab Results  Component Value Date   NA 133 (L) 07/09/2023   K 3.0 (L) 07/09/2023   CL 100 07/09/2023   CO2 20 (L) 07/09/2023      Assessment/Plan: Septic shock secondary to PD catheter related MSSA peritonitis and MSSA bacteremia. Sepsis physiology rapidly improving TEE with no endocarditis Repeat blood cultures on 2/27 negative so far Per ophthalmology-no evidence of endophthalmitis. Repeat peritoneal fluid 2/28-significant reduction in WBC count Remains on IV Ancef ID following await further recommendations.  ESRD on peritoneal dialysis Nephrology following ID/renal discussion-given improvement in WBC count and peritoneal fluid on 2/28-okay to keep PD catheter in place for now.  Normocytic anemia Secondary to acute illness and ESRD Follow CBC  Hypokalemia Replete/recheck  DM-1 (A1c 7.2-2/13) CBG stable Continue Lantus to 3 units-continue SSI Follow/optimize.  Recent Labs    07/08/23 1709 07/08/23 2122 07/09/23 0842  GLUCAP 102* 208* 110*    HLD Statin  ?  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  Retinal detachment Poor historian Appreciate ophthalmology input on 2/27-resume outpatient follow-up with her primary ophthalmologist.  Pressure Ulcer: Agree with assessment and plan as outlined  Pressure Injury 07/05/23 Sacrum Mid Stage 2 -  Partial thickness loss of dermis presenting as a shallow open injury with a red, pink wound bed without slough. 2x0.5 linear mid sacral wound (Active)  07/05/23 0000  Location:  Sacrum  Location Orientation: Mid  Staging: Stage 2 -  Partial thickness loss of dermis presenting as a shallow open injury with a red, pink wound bed without slough.  Wound Description (Comments): 2x0.5 linear mid sacral wound  Present on Admission: Yes  Dressing Type Foam - Lift dressing to assess site every shift 07/09/23 0501    Underweight: Estimated body mass index is 21.62 kg/m as calculated from the following:   Height as of this encounter: 5\' 8"  (1.727 m).   Weight as of this encounter: 64.5 kg.   Code status:   Code Status: Full Code   DVT Prophylaxis: heparin injection 5,000 Units Start: 07/04/23 2230 SCDs Start: 07/04/23 2229  Family Communication: Mother at bedside   Disposition Plan: Status is: Inpatient Remains inpatient appropriate because: Severity of illness   Planned Discharge Destination:Home health   Diet: Diet Order             Diet renal/carb modified with fluid restriction Diet-HS Snack? Nothing; Fluid restriction: 1200 mL Fluid; Room service appropriate? Yes; Fluid consistency: Thin  Diet effective now                     Antimicrobial agents: Anti-infectives (From admission, onward)    Start     Dose/Rate Route Frequency Ordered Stop   07/05/23 2000  ceFAZolin (ANCEF) IVPB 1 g/50 mL premix        1 g 100 mL/hr over 30 Minutes Intravenous Every 24 hours 07/05/23 1737     07/05/23 1800  ceFEPIme (MAXIPIME) 1 g in sodium chloride 0.9 % 100 mL IVPB  Status:  Discontinued        1 g 200 mL/hr over 30 Minutes Intravenous Every 24 hours 07/05/23 0423 07/05/23 0914   07/05/23 1800  cefTAZidime (FORTAZ) 1 g in sodium chloride 0.9 % 100 mL IVPB  Status:  Discontinued        1 g 200 mL/hr over 30 Minutes Intravenous Every 24 hours 07/05/23 0914 07/05/23 1643   07/05/23 0500  ceFEPIme (MAXIPIME) 1 g in sodium chloride 0.9 % 100 mL IVPB  Status:  Discontinued        1 g 200 mL/hr over 30 Minutes Intravenous Every 24 hours 07/05/23 0423 07/05/23  0423   07/05/23 0422  vancomycin variable dose per unstable renal function (pharmacist dosing)  Status:  Discontinued         Does not apply See admin instructions 07/05/23 0423 07/05/23 1643   07/05/23 0345  vancomycin (VANCOCIN) IVPB 1000 mg/200 mL premix  Status:  Discontinued        1,000 mg 200 mL/hr over 60 Minutes Intravenous Every 12 hours 07/05/23 0259 07/05/23 0452   07/05/23 0345  ceFEPIme (MAXIPIME) 500 mg in dextrose 5 % 50 mL IVPB  Status:  Discontinued        500 mg 110 mL/hr over 30 Minutes Intravenous Every 12 hours 07/05/23 0259 07/05/23 0423   07/04/23 1745  ceFEPIme (MAXIPIME) 2 g in sodium chloride 0.9 % 100 mL IVPB        2 g 200 mL/hr over 30 Minutes Intravenous  Once 07/04/23 1739 07/04/23 1835   07/04/23 1745  vancomycin (VANCOREADY) IVPB 1500 mg/300 mL  1,500 mg 150 mL/hr over 120 Minutes Intravenous  Once 07/04/23 1743 07/04/23 2209        MEDICATIONS: Scheduled Meds:  calcitRIOL  1 mcg Oral Daily   Chlorhexidine Gluconate Cloth  6 each Topical Daily   cinacalcet  30 mg Oral Q breakfast   famotidine  10 mg Oral Daily   feeding supplement (NEPRO CARB STEADY)  237 mL Oral BID BM   gentamicin cream  1 Application Topical Daily   heparin  5,000 Units Subcutaneous Q8H   insulin aspart  0-6 Units Subcutaneous TID WC   insulin glargine  3 Units Subcutaneous QHS   midodrine  5 mg Oral TID WC   multivitamin  1 tablet Oral Daily   potassium chloride  20 mEq Oral BID   rosuvastatin  10 mg Oral Daily   sodium chloride flush  3-10 mL Intravenous Q12H   Continuous Infusions:   ceFAZolin (ANCEF) IV 1 g (07/08/23 2059)   heparin sodium (porcine) 1,500 Units in dialysis solution 1.5% low-MG/low-CA 3,000 mL dialysis solution     heparin sodium (porcine) 1,500 Units in dialysis solution 2.5% low-MG/low-CA 3,000 mL dialysis solution     PRN Meds:.acetaminophen, docusate sodium, HYDROmorphone (DILAUDID) injection, loperamide, ondansetron (ZOFRAN) IV, mouth rinse,  polyethylene glycol, polyvinyl alcohol, prochlorperazine, sodium chloride flush   I have personally reviewed following labs and imaging studies  LABORATORY DATA: CBC: Recent Labs  Lab 07/04/23 1542 07/05/23 0113 07/05/23 0754 07/06/23 0325 07/07/23 0520 07/08/23 0608 07/09/23 0602  WBC 10.0   < > 20.5* 15.8* 13.5* 13.8* 14.7*  NEUTROABS 9.0*  --   --   --   --  11.4*  --   HGB 10.5*   < > 10.3* 9.9* 10.0* 10.5* 9.7*  HCT 32.1*   < > 31.0* 29.6* 29.9* 30.7* 28.7*  MCV 89.7   < > 87.8 88.1 87.2 86.5 87.8  PLT 244   < > 257 294 205 211 196   < > = values in this interval not displayed.    Basic Metabolic Panel: Recent Labs  Lab 07/04/23 2230 07/05/23 0113 07/05/23 0124 07/05/23 0754 07/05/23 1248 07/06/23 0325 07/07/23 0520 07/08/23 0608 07/09/23 0602  NA 135   < >  --  134*  --  132*  134* 131* 134* 133*  K 2.3*   < >  --  2.5* 3.5 3.5  3.6 3.3* 3.2* 3.0*  CL 103   < >  --  105  --  100  101 100 101 100  CO2 20*   < >  --  18*  --  18*  20* 17* 19* 20*  GLUCOSE 132*   < >  --  98  --  208*  209* 201* 90 202*  BUN 35*   < >  --  38*  --  35*  34* 33* 29* 27*  CREATININE 9.84*   < >  --  10.37*  --  8.88*  8.86* 7.91* 7.68* 7.37*  CALCIUM 7.1*   < >  --  7.2*  --  7.5*  7.5* 7.2* 7.1* 6.9*  MG 1.8  --  2.0 1.9  --  1.9 1.9  --   --   PHOS  --    < > 4.2  --   --  3.6 3.3 3.0 2.9   < > = values in this interval not displayed.    GFR: Estimated Creatinine Clearance: 11.5 mL/min (A) (by C-G formula based on SCr of  7.37 mg/dL (H)).  Liver Function Tests: Recent Labs  Lab 07/04/23 1542 07/05/23 0113 07/06/23 0325 07/07/23 0520 07/08/23 4696 07/09/23 0602  AST 10*  --   --   --   --   --   ALT 10  --   --   --   --   --   ALKPHOS 84  --   --   --   --   --   BILITOT 0.6  --   --   --   --   --   PROT 5.4*  --   --   --   --   --   ALBUMIN 1.7* <1.5* <1.5* <1.5* <1.5* <1.5*   No results for input(s): "LIPASE", "AMYLASE" in the last 168 hours. No  results for input(s): "AMMONIA" in the last 168 hours.  Coagulation Profile: Recent Labs  Lab 07/04/23 1542  INR 1.4*    Cardiac Enzymes: No results for input(s): "CKTOTAL", "CKMB", "CKMBINDEX", "TROPONINI" in the last 168 hours.  BNP (last 3 results) No results for input(s): "PROBNP" in the last 8760 hours.  Lipid Profile: No results for input(s): "CHOL", "HDL", "LDLCALC", "TRIG", "CHOLHDL", "LDLDIRECT" in the last 72 hours.  Thyroid Function Tests: No results for input(s): "TSH", "T4TOTAL", "FREET4", "T3FREE", "THYROIDAB" in the last 72 hours.  Anemia Panel: No results for input(s): "VITAMINB12", "FOLATE", "FERRITIN", "TIBC", "IRON", "RETICCTPCT" in the last 72 hours.  Urine analysis:    Component Value Date/Time   COLORURINE YELLOW 10/07/2021 1325   APPEARANCEUR TURBID (A) 10/07/2021 1325   LABSPEC 1.011 10/07/2021 1325   PHURINE 5.0 10/07/2021 1325   GLUCOSEU 50 (A) 10/07/2021 1325   HGBUR SMALL (A) 10/07/2021 1325   BILIRUBINUR NEGATIVE 10/07/2021 1325   KETONESUR NEGATIVE 10/07/2021 1325   PROTEINUR 100 (A) 10/07/2021 1325   UROBILINOGEN 0.2 01/07/2015 1723   NITRITE NEGATIVE 10/07/2021 1325   LEUKOCYTESUR LARGE (A) 10/07/2021 1325    Sepsis Labs: Lactic Acid, Venous    Component Value Date/Time   LATICACIDVEN 0.7 07/04/2023 2109    MICROBIOLOGY: Recent Results (from the past 240 hours)  Culture, blood (Routine x 2)     Status: Abnormal   Collection Time: 07/04/23  3:42 PM   Specimen: BLOOD  Result Value Ref Range Status   Specimen Description BLOOD SITE NOT SPECIFIED  Final   Special Requests   Final    BOTTLES DRAWN AEROBIC AND ANAEROBIC Blood Culture results may not be optimal due to an inadequate volume of blood received in culture bottles   Culture  Setup Time   Final    GRAM POSITIVE COCCI IN CLUSTERS AEROBIC BOTTLE ONLY CRITICAL RESULT CALLED TO, READ BACK BY AND VERIFIED WITH: PHARMD ADaphane Shepherd [id number] @ 1604 FH Performed at [facility], [address], [place], [place] [address]    Culture STAPHYLOCOCCUS AUREUS (A)  Final   Report Status 07/07/2023 FINAL  Final   Organism ID, Bacteria STAPHYLOCOCCUS AUREUS  Final      Susceptibility   Staphylococcus aureus - MIC*    CIPROFLOXACIN <=0.5 SENSITIVE Sensitive     ERYTHROMYCIN <=0.25 SENSITIVE Sensitive     GENTAMICIN <=0.5 SENSITIVE Sensitive     OXACILLIN <=0.25 SENSITIVE Sensitive     TETRACYCLINE <=1 SENSITIVE Sensitive     VANCOMYCIN 1 SENSITIVE Sensitive     TRIMETH/SULFA <=10 SENSITIVE Sensitive     CLINDAMYCIN <=0.25 SENSITIVE Sensitive     RIFAMPIN <=0.5 SENSITIVE Sensitive     Inducible Clindamycin  NEGATIVE Sensitive     LINEZOLID 2 SENSITIVE Sensitive     * STAPHYLOCOCCUS AUREUS  Blood Culture ID Panel (Reflexed)     Status: Abnormal   Collection Time: 07/04/23  3:42 PM  Result Value Ref Range Status   Enterococcus faecalis NOT DETECTED NOT DETECTED Final   Enterococcus Faecium NOT DETECTED NOT DETECTED Final   Listeria monocytogenes NOT DETECTED NOT DETECTED Final   Staphylococcus species DETECTED (A) NOT DETECTED Final    Comment: CRITICAL RESULT CALLED TO, READ BACK BY AND VERIFIED WITH: PHARMD A. MEYER [id number] @ 1604 FH    Staphylococcus aureus (BCID) DETECTED (A) NOT DETECTED Final    Comment: CRITICAL RESULT CALLED TO, READ BACK BY AND VERIFIED WITH: PHARMD A. MEYER [id number] @ 1604 FH    Staphylococcus epidermidis NOT DETECTED NOT DETECTED Final   Staphylococcus lugdunensis NOT DETECTED NOT DETECTED Final   Streptococcus species NOT DETECTED NOT DETECTED Final   Streptococcus agalactiae NOT DETECTED NOT DETECTED Final   Streptococcus pneumoniae NOT DETECTED NOT DETECTED Final   Streptococcus pyogenes NOT DETECTED NOT DETECTED Final   A.calcoaceticus-baumannii NOT DETECTED NOT DETECTED Final   Bacteroides fragilis NOT DETECTED NOT DETECTED Final   Enterobacterales NOT DETECTED NOT DETECTED Final   Enterobacter cloacae complex NOT DETECTED NOT  DETECTED Final   Escherichia coli NOT DETECTED NOT DETECTED Final   Klebsiella aerogenes NOT DETECTED NOT DETECTED Final   Klebsiella oxytoca NOT DETECTED NOT DETECTED Final   Klebsiella pneumoniae NOT DETECTED NOT DETECTED Final   Proteus species NOT DETECTED NOT DETECTED Final   Salmonella species NOT DETECTED NOT DETECTED Final   Serratia marcescens NOT DETECTED NOT DETECTED Final   Haemophilus influenzae NOT DETECTED NOT DETECTED Final   Neisseria meningitidis NOT DETECTED NOT DETECTED Final   Pseudomonas aeruginosa NOT DETECTED NOT DETECTED Final   Stenotrophomonas maltophilia NOT DETECTED NOT DETECTED Final   Candida albicans NOT DETECTED NOT DETECTED Final   Candida auris NOT DETECTED NOT DETECTED Final   Candida glabrata NOT DETECTED NOT DETECTED Final   Candida krusei NOT DETECTED NOT DETECTED Final   Candida parapsilosis NOT DETECTED NOT DETECTED Final   Candida tropicalis NOT DETECTED NOT DETECTED Final   Cryptococcus neoformans/gattii NOT DETECTED NOT DETECTED Final   Meth resistant mecA/C and MREJ NOT DETECTED NOT DETECTED Final    Comment: Performed at [facility], [address], [place], [place] [address]  Resp panel by RT-PCR (RSV, Flu A&B, Covid) Anterior Nasal Swab     Status: None   Collection Time: 07/04/23  3:46 PM   Specimen: Anterior Nasal Swab  Result Value Ref Range Status   SARS Coronavirus 2 by RT PCR NEGATIVE NEGATIVE Final   Influenza A by PCR NEGATIVE NEGATIVE Final   Influenza B by PCR NEGATIVE NEGATIVE Final    Comment: (NOTE) The Xpert Xpress SARS-CoV-2/FLU/RSV plus assay is intended as an aid in the diagnosis of influenza from Nasopharyngeal swab specimens and should not be used as a sole basis for treatment. Nasal washings and aspirates are unacceptable for Xpert Xpress SARS-CoV-2/FLU/RSV testing.  Fact Sheet for Patients: BloggerCourse.com  Fact Sheet for Healthcare  Providers: SeriousBroker.it  This test is not yet approved or cleared by the [place] FDA and has been authorized for detection and/or diagnosis of SARS-CoV-2 by FDA under an Emergency Use Authorization (EUA). This EUA will remain in effect (meaning this test can be used) for the duration of the COVID-19 declaration under Section 564(b)(1) of the Act, 21  U.S.C. section 360bbb-3(b)(1), unless the authorization is terminated or revoked.     Resp Syncytial Virus by PCR NEGATIVE NEGATIVE Final    Comment: (NOTE) Fact Sheet for Patients: BloggerCourse.com  Fact Sheet for Healthcare Providers: SeriousBroker.it  This test is not yet approved or cleared by the [place] FDA and has been authorized for detection and/or diagnosis of SARS-CoV-2 by FDA under an Emergency Use Authorization (EUA). This EUA will remain in effect (meaning this test can be used) for the duration of the COVID-19 declaration under Section 564(b)(1) of the Act, 21 U.S.C. section 360bbb-3(b)(1), unless the authorization is terminated or revoked.  Performed at [facility], [address] [place], [place] [address]   Body fluid culture w Gram Stain     Status: None   Collection Time: 07/04/23  7:34 PM   Specimen: Peritoneal Dialysate; Body Fluid  Result Value Ref Range Status   Specimen Description PERITONEAL DIALYSATE ABDOMEN  Final   Special Requests NONE  Final   Gram Stain   Final    ABUNDANT WBC PRESENT, PREDOMINANTLY PMN NO ORGANISMS SEEN Performed at [facility], [address] [place], [place] [address]    Culture MODERATE STAPHYLOCOCCUS AUREUS  Final   Report Status 07/06/2023 FINAL  Final   Organism ID, Bacteria STAPHYLOCOCCUS AUREUS  Final      Susceptibility   Staphylococcus aureus - MIC*    CIPROFLOXACIN <=0.5 SENSITIVE Sensitive     ERYTHROMYCIN <=0.25 SENSITIVE Sensitive     GENTAMICIN <=0.5  SENSITIVE Sensitive     OXACILLIN <=0.25 SENSITIVE Sensitive     TETRACYCLINE <=1 SENSITIVE Sensitive     VANCOMYCIN 1 SENSITIVE Sensitive     TRIMETH/SULFA <=10 SENSITIVE Sensitive     CLINDAMYCIN <=0.25 SENSITIVE Sensitive     RIFAMPIN <=0.5 SENSITIVE Sensitive     Inducible Clindamycin NEGATIVE Sensitive     LINEZOLID 2 SENSITIVE Sensitive     * MODERATE STAPHYLOCOCCUS AUREUS  Culture, blood (Routine x 2)     Status: None   Collection Time: 07/04/23  9:08 PM   Specimen: BLOOD LEFT ARM  Result Value Ref Range Status   Specimen Description BLOOD LEFT ARM  Final   Special Requests   Final    BOTTLES DRAWN AEROBIC AND ANAEROBIC Blood Culture results may not be optimal due to an inadequate volume of blood received in culture bottles   Culture   Final    NO GROWTH 5 DAYS Performed at [facility], [address], [place], [place] [address]    Report Status 07/09/2023 FINAL  Final  MRSA Next Gen by PCR, Nasal     Status: None   Collection Time: 07/05/23 12:12 AM   Specimen: Nasal Mucosa; Nasal Swab  Result Value Ref Range Status   MRSA by PCR Next Gen NOT DETECTED NOT DETECTED Final    Comment: (NOTE) The GeneXpert MRSA Assay (FDA approved for NASAL specimens only), is one component of a comprehensive MRSA colonization surveillance program. It is not intended to diagnose MRSA infection nor to guide or monitor treatment for MRSA infections. Test performance is not FDA approved in patients less than 43 years old. Performed at [facility], [address] [place], [place] [address]   Body fluid culture w Gram Stain     Status: None   Collection Time: 07/05/23  7:19 AM   Specimen: Peritoneal Washings; Body Fluid  Result Value Ref Range Status   Specimen Description PERITONEAL  Final   Special Requests NONE  Final   Gram Stain   Final    ABUNDANT WBC PRESENT, PREDOMINANTLY PMN RARE GRAM POSITIVE COCCI Performed at [facility], [address], [place],  [place] [address]    Culture FEW STAPHYLOCOCCUS AUREUS  Final   Report Status 07/07/2023 FINAL  Final   Organism ID, Bacteria STAPHYLOCOCCUS AUREUS  Final      Susceptibility   Staphylococcus aureus - MIC*    CIPROFLOXACIN <=0.5 SENSITIVE Sensitive     ERYTHROMYCIN <=0.25 SENSITIVE Sensitive     GENTAMICIN <=0.5 SENSITIVE Sensitive     OXACILLIN 0.5 SENSITIVE Sensitive     TETRACYCLINE <=1 SENSITIVE Sensitive     VANCOMYCIN 1 SENSITIVE Sensitive     TRIMETH/SULFA <=10 SENSITIVE Sensitive     CLINDAMYCIN <=0.25 SENSITIVE Sensitive     RIFAMPIN <=0.5 SENSITIVE Sensitive     Inducible Clindamycin NEGATIVE Sensitive     LINEZOLID 2 SENSITIVE Sensitive     * FEW STAPHYLOCOCCUS AUREUS  Culture, blood (Routine X 2) w Reflex to ID Panel     Status: None (Preliminary result)   Collection Time: 07/07/23  5:20 AM   Specimen: BLOOD LEFT HAND  Result Value Ref Range Status   Specimen Description BLOOD LEFT HAND  Final   Special Requests   Final    BOTTLES DRAWN AEROBIC ONLY Blood Culture results may not be optimal due to an inadequate volume of blood received in culture bottles   Culture   Final    NO GROWTH 2 DAYS Performed at [facility], [address], [place], [place] [address]    Report Status PENDING  Incomplete  Culture, blood (Routine X 2) w Reflex to ID Panel     Status: None (Preliminary result)   Collection Time: 07/07/23  5:27 AM   Specimen: BLOOD LEFT ARM  Result Value Ref Range Status   Specimen Description BLOOD LEFT ARM  Final   Special Requests   Final    BOTTLES DRAWN AEROBIC AND ANAEROBIC Blood Culture adequate volume   Culture   Final    NO GROWTH 2 DAYS Performed at [facility], [address] [place], [place] [address]    Report Status PENDING  Incomplete    RADIOLOGY STUDIES/RESULTS: EP STUDY Result Date: 07/08/2023 See surgical note for result.    LOS: 5 days   Jeoffrey Massed, MD  [facility]    To contact the attending provider  between 7A-7P or the covering provider during after hours 7P-7A, please log into the web site [email or web address] and access using universal [place] password for that web site. If you do not have the password, please call the hospital operator.  07/09/2023, 1:16 PM

## 2023-07-10 DIAGNOSIS — R7881 Bacteremia: Secondary | ICD-10-CM | POA: Diagnosis not present

## 2023-07-10 DIAGNOSIS — Z992 Dependence on renal dialysis: Secondary | ICD-10-CM | POA: Diagnosis not present

## 2023-07-10 DIAGNOSIS — N186 End stage renal disease: Secondary | ICD-10-CM | POA: Diagnosis not present

## 2023-07-10 DIAGNOSIS — T8571XS Infection and inflammatory reaction due to peritoneal dialysis catheter, sequela: Secondary | ICD-10-CM | POA: Diagnosis not present

## 2023-07-10 DIAGNOSIS — N2581 Secondary hyperparathyroidism of renal origin: Secondary | ICD-10-CM | POA: Diagnosis not present

## 2023-07-10 DIAGNOSIS — R6521 Severe sepsis with septic shock: Secondary | ICD-10-CM | POA: Diagnosis not present

## 2023-07-10 DIAGNOSIS — B9561 Methicillin susceptible Staphylococcus aureus infection as the cause of diseases classified elsewhere: Secondary | ICD-10-CM | POA: Diagnosis not present

## 2023-07-10 LAB — GLUCOSE, CAPILLARY
Glucose-Capillary: 148 mg/dL — ABNORMAL HIGH (ref 70–99)
Glucose-Capillary: 226 mg/dL — ABNORMAL HIGH (ref 70–99)
Glucose-Capillary: 81 mg/dL (ref 70–99)
Glucose-Capillary: 170 mg/dL — ABNORMAL HIGH (ref 70–99)

## 2023-07-10 LAB — CBC
HCT: 28.1 % — ABNORMAL LOW (ref 36.0–46.0)
Hemoglobin: 9.5 g/dL — ABNORMAL LOW (ref 12.0–15.0)
MCH: 29.2 pg (ref 26.0–34.0)
MCHC: 33.8 g/dL (ref 30.0–36.0)
MCV: 86.5 fL (ref 80.0–100.0)
Platelets: 175 10*3/uL (ref 150–400)
RBC: 3.25 MIL/uL — ABNORMAL LOW (ref 3.87–5.11)
RDW: 19.9 % — ABNORMAL HIGH (ref 11.5–15.5)
WBC: 15.1 10*3/uL — ABNORMAL HIGH (ref 4.0–10.5)
nRBC: 0 % (ref 0.0–0.2)

## 2023-07-10 LAB — RENAL FUNCTION PANEL
Albumin: <1.5 g/dL — ABNORMAL LOW (ref 3.5–5.0)
Anion gap: 15 (ref 5–15)
BUN: 24 mg/dL — ABNORMAL HIGH (ref 6–20)
CO2: 21 mmol/L — ABNORMAL LOW (ref 22–32)
Calcium: 7.2 mg/dL — ABNORMAL LOW (ref 8.9–10.3)
Chloride: 99 mmol/L (ref 98–111)
Creatinine, Ser: 7.35 mg/dL — ABNORMAL HIGH (ref 0.44–1.00)
GFR, Estimated: 7 mL/min — ABNORMAL LOW (ref >60)
Glucose, Bld: 183 mg/dL — ABNORMAL HIGH (ref 70–99)
Phosphorus: 2.5 mg/dL (ref 2.5–4.6)
Potassium: 3 mmol/L — ABNORMAL LOW (ref 3.5–5.1)
Sodium: 135 mmol/L (ref 135–145)

## 2023-07-10 MED ORDER — GENTAMICIN SULFATE 0.1 % EX CREA
1.0000 | TOPICAL_CREAM | Freq: Every day | CUTANEOUS | Status: DC
  Administered 2023-07-11: 1 via TOPICAL
  Filled 2023-07-10: qty 15

## 2023-07-10 MED ORDER — DELFLEX-LC/1.5% DEXTROSE 344 MOSM/L IP SOLN
INTRAPERITONEAL | Status: DC
Start: 2023-07-10 — End: 2023-07-13

## 2023-07-10 MED ORDER — INSULIN GLARGINE 100 UNIT/ML ~~LOC~~ SOLN
5.0000 [IU] | Freq: Every day | SUBCUTANEOUS | Status: DC
  Administered 2023-07-10 – 2023-07-13 (×2): 5 [IU] via SUBCUTANEOUS
  Filled 2023-07-10 (×4): qty 0.05

## 2023-07-10 NOTE — Plan of Care (Signed)

## 2023-07-10 NOTE — Progress Notes (Signed)
 [facility] Progress Note  Name: Lindsey Murillo MRN: [id number] DOB: [birth year]  Chief Complaint:  Abdominal pain  Subjective:  She had 400 mL UOP over 3/1 and has had another 200 mL UOP thus far today.  We broadened her diet yesterday.  Having diarrhea still.  I spoke with primary team - they are going to start some immodium.  Spoke with her mother at bedside.  If she had to she would switch to HD - she would hope to do PD if possible.   UF:  Overnight 3/1 - 3/2 - 126 mL (gain) Overnight 2/28-3/1 - 197 mL Overnight 2/27-2/28 - 306 mL (gain) overnight 2/26 - 2/27 - negative 215 mL (gain)  overnight 2/25 - 2/26 - no total UF is charted   Review of systems:      No chest pain  No shortness of breath  Does make some urine  She is having diarrhea still - she is sitting in stool and I have reached out to the nurse  ------------- Background on consult:  Lindsey Murillo is a 28 y.o. female with a PMH significant for type 1 DM,  HTN, autoimmune thyroiditis, and ESRD on CCPD who presented to [facility] ED today after developing abdominal pain this morning.  She has not been feeling well and has had N/V/D for the past 4-5 days as well as a cough.  She denies any cloudy PD fluid and reports that her abdominal pain just started today.  In the ED, temp max 100.7, bp 82/46, HR 110, SpO2 93% on room air.  Labs notable for WBC 10, Hgb 10.5, plt 244, Na 134, K 2.6, Co2 17, BUN 36, Cr 10.6, Ca 7.8, alb 1.7.  Presentation concerning for SBP and we were consulted to manage her PD and send off PD fluid for studies during her hospitalization.  Of note, she was not doing her PD correctly for the past week and was 10 kg above edw.  She was not doing the proper amount of exchanges and was reabsorbing.  She declined coming in for HD session to help with her volume, however she declined and performed CAPD at home.  She now appears hypovolemic due to vomiting and diarrhea which predated her abdominal  pain and may have a viral gastroenteritis.  Outpatient PD RN reports pt on 6 exchanges over night with 2.4 fills.  Dwell time 1.5 hours.  EDW is 50 kg. She was just changed from CAPD to CCPD.  Mircera 50 mcg on 2/19 and receives this every two weeks usually or her hemoglobin will drop   Spoke with outpatient PD RN.  Patient has worked very hard to be able to do PD (got her own place, got her blood sugar under control) however recently she has been sick often. She has previously stated uses 1.5% dextrose often at home.  She states that she feels like her vision/eyes are at baseline.   Intake/Output Summary (Last 24 hours) at 07/10/2023 0942 Last data filed at 07/10/2023 0800 Gross per 24 hour  Intake -123 ml  Output 600 ml  Net -723 ml    Vitals:  Vitals:   07/10/23 0000 07/10/23 0400 07/10/23 0458 07/10/23 0800  BP: 90/60 (!) 97/56  102/69  Pulse: 96 90  98  Resp: 14 17  18   Temp: 98.2 F (36.8 C) 97.9 F (36.6 C)  98.1 F (36.7 C)  TempSrc: Oral Oral  Oral  SpO2: 98% 99%  100%  Weight:  64.5 kg   Height:         Physical Exam:     General adult female in bed in no acute distress HEENT normocephalic atraumatic extraocular movements intact sclera anicteric Neck supple trachea midline Lungs clear to auscultation bilaterally normal work of breathing at rest on room air Heart S1S2 no rub Abdomen soft slightly tender to palpation; PD catheter in place  Extremities 1+ edema lower extremities with legs dependent  Psych normal mood and affect Neuro alert and oriented x 3 provides hx follows commands Access: PD catheter as above.  RUE AVF with bruit and thrill   Medications reviewed   Labs:     Latest Ref Rng & Units 07/10/2023    5:58 AM 07/09/2023    6:02 AM 07/08/2023    6:08 AM  BMP  Glucose 70 - 99 mg/dL 478  295  90   BUN 6 - 20 mg/dL 24  27  29    Creatinine 0.44 - 1.00 mg/dL 6.21  3.08  6.57   Sodium 135 - 145 mmol/L 135  133  134   Potassium 3.5 - 5.1 mmol/L 3.0  3.0   3.2   Chloride 98 - 111 mmol/L 99  100  101   CO2 22 - 32 mmol/L 21  20  19    Calcium 8.9 - 10.3 mg/dL 7.2  6.9  7.1      Assessment/Plan:   PD-associated Bacterial peritonitis -  Moderate staph aureus on PD fluid culture On cefazolin  If ID feels is appropriate then may need her PD catheter removed - patient is willing to do this if needed Cell count and culture today from PD fluid   Will need to follow-up with ID for final duration of abx ESRD -   Will plan on CCPD tonight using all 1.5% dextrose   Stopping heparin with PD bags for now (fibrin noted once then blood-tinged PD fluid overnight) Ordered strict ins/outs  Note that she does have an AVF as well  Staph aureus bacteremia - on cefazolin.  Cultures from 2/27 and 2/24 are pending (note some may not be adequate due to volume per lab note). Type 1 DM - per primary team Hypertension/volume  - despite trace pretibial edema and being above edw, she appeared volume depleted intravascularly and was hypotensive on arrival. PD as above. She is on midodrine 5 mg TID  Anemia of CKD - trend Hb.  She got mircera 50 mcg on 2/19 and receives this every two weeks usually or her hemoglobin will drop  Ordered aranesp 100 mcg every Wednesday for 07/13/23 Metabolic bone disease -  phos controlled. On calcitriol.  Have stopped sensipar for now due to hypocalcemia Hypokalemia - likely from GI losses. Have increased potassium to 40 meq BID for now and may need to titrate down after resolution of diarrhea.  Have broadened her diet as well.  Diarrhea - work-up and supportive care per primary team.  They are starting immodium  Disposition - continue inpatient monitoring   Estanislado Emms, MD 07/10/2023 10:32 AM

## 2023-07-10 NOTE — Progress Notes (Signed)
 PROGRESS NOTE        PATIENT DETAILS Name: Lindsey Murillo Age: 28 y.o. Sex: female Date of Birth: 09/25/1995 Admit Date: 07/04/2023 Admitting Physician Madaline Brilliant, DO [id number], Verdis Frederickson, DO  Brief Summary: Patient is a 28 y.o.  female with history of ESRD on peritoneal dialysis, DM-1, HTN-who presented with abdominal pain-she was found to have septic shock in the setting of PD catheter associated peritonitis and MSSA bacteremia.  She was initially admitted to the ICU-required pressors-evaluated by ID/nephrology-placed on appropriate antimicrobial therapy-stabilized and subsequently transferred to [facility].  See below for further details.  Significant events: 2/25>> septic shock-PD catheter associated peritonitis/MSSA bacteremia-on Levophed 2/26 >> off Levophed 2/27>> transferred to [facility].  Significant studies: 2/24>> CXR: Strandy/patchy opacities left lower lobe-atelectasis versus pneumonia 2/24>> CT abdomen pelvis:  Fluid in the abdomen/pelvis, PD catheter in place 2/26>> echo: EF 50-55%, grade 2 diastolic dysfunction. 2/28>> TEE: No endocarditis.  Significant microbiology data: 2/24>> COVID/influenza/RSV PCR: Negative 2/24>> peritoneal fluid culture: MSSA 2/24>> blood culture: MSSA 2/25>> peritoneal fluid culture: MSSA 2/27>> blood culture: Negative  Procedures: None  Consults: PCCM Nephrology ID Opthalmology  Subjective: Significant decrease in abdominal pain for the past several days-some diarrhea continues.  No other complaints.  Objective: Vitals: Blood pressure 102/69, pulse 98, temperature 98.1 F (36.7 C), temperature source Oral, resp. rate 18, height 5\' 8"  (1.727 m), weight 64.5 kg, SpO2 100%.   Exam: Gen Exam:Alert awake-not in any distress HEENT:atraumatic, normocephalic Chest: B/L clear to auscultation anteriorly CVS:S1S2 regular Abdomen:soft non tender, non distended Extremities:no edema Neurology: Non focal Skin: no  rash  Pertinent Labs/Radiology:    Latest Ref Rng & Units 07/10/2023    5:58 AM 07/09/2023    6:02 AM 07/08/2023    6:08 AM  CBC  WBC 4.0 - 10.5 K/uL 15.1  14.7  13.8   Hemoglobin 12.0 - 15.0 g/dL 9.5  9.7  95.6   Hematocrit 36.0 - 46.0 % 28.1  28.7  30.7   Platelets 150 - 400 K/uL 175  196  211     Lab Results  Component Value Date   NA 135 07/10/2023   K 3.0 (L) 07/10/2023   CL 99 07/10/2023   CO2 21 (L) 07/10/2023      Assessment/Plan: Septic shock secondary to PD catheter related MSSA peritonitis and MSSA bacteremia. Sepsis physiology rapidly improving TEE with no endocarditis Repeat blood cultures on 2/27 negative so far Per ophthalmology-no evidence of endophthalmitis. Unfortunately-cell count and peritoneal fluid on 3/1-has worsened-discussed with nephrology-plan to send peritoneal fluid cultures today-if cultures demonstrate persistent MSSA infection-May need to consider removing PD catheter at some point. Remains on IV Ancef ID following await further recommendations.  ESRD on peritoneal dialysis Nephrology following See above regarding repeating peritoneal fluid cultures.  Normocytic anemia Secondary to acute illness and ESRD Follow CBC  Hypokalemia Secondary to GI loss-having some diarrhea-appears to be a chronic issue as well Potassium being supplemented.  DM-1 (A1c 7.2-2/13) CBG slowly creeping up Increase Lantus to 5 units-continue SSI.   Recent Labs    07/09/23 1615 07/09/23 2131 07/10/23 0839  GLUCAP 190* 242* 148*    HLD Statin  ?  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  Retinal detachment Poor historian Appreciate ophthalmology input  on 2/27-resume outpatient follow-up with her primary ophthalmologist.  Pressure Ulcer: Agree with assessment and plan as outlined Pressure Injury 07/05/23 Sacrum Mid Stage 2 -  Partial thickness loss  of dermis presenting as a shallow open injury with a red, pink wound bed without slough. 2x0.5 linear mid sacral wound (Active)  07/05/23 0000  Location: Sacrum  Location Orientation: Mid  Staging: Stage 2 -  Partial thickness loss of dermis presenting as a shallow open injury with a red, pink wound bed without slough.  Wound Description (Comments): 2x0.5 linear mid sacral wound  Present on Admission: Yes  Dressing Type None 07/09/23 2030    Underweight: Estimated body mass index is 21.62 kg/m as calculated from the following:   Height as of this encounter: 5\' 8"  (1.727 m).   Weight as of this encounter: 64.5 kg.   Code status:   Code Status: Full Code   DVT Prophylaxis: heparin injection 5,000 Units Start: 07/04/23 2230 SCDs Start: 07/04/23 2229  Family Communication: Mother at bedside   Disposition Plan: Status is: Inpatient Remains inpatient appropriate because: Severity of illness   Planned Discharge Destination:Home health   Diet: Diet Order             Diet Carb Modified Fluid consistency: Thin; Room service appropriate? Yes; Fluid restriction: 1500 mL Fluid  Diet effective now                     Antimicrobial agents: Anti-infectives (From admission, onward)    Start     Dose/Rate Route Frequency Ordered Stop   07/05/23 2000  ceFAZolin (ANCEF) IVPB 1 g/50 mL premix        1 g 100 mL/hr over 30 Minutes Intravenous Every 24 hours 07/05/23 1737     07/05/23 1800  ceFEPIme (MAXIPIME) 1 g in sodium chloride 0.9 % 100 mL IVPB  Status:  Discontinued        1 g 200 mL/hr over 30 Minutes Intravenous Every 24 hours 07/05/23 0423 07/05/23 0914   07/05/23 1800  cefTAZidime (FORTAZ) 1 g in sodium chloride 0.9 % 100 mL IVPB  Status:  Discontinued        1 g 200 mL/hr over 30 Minutes Intravenous Every 24 hours 07/05/23 0914 07/05/23 1643   07/05/23 0500  ceFEPIme (MAXIPIME) 1 g in sodium chloride 0.9 % 100 mL IVPB  Status:  Discontinued        1 g 200 mL/hr over  30 Minutes Intravenous Every 24 hours 07/05/23 0423 07/05/23 0423   07/05/23 0422  vancomycin variable dose per unstable renal function (pharmacist dosing)  Status:  Discontinued         Does not apply See admin instructions 07/05/23 0423 07/05/23 1643   07/05/23 0345  vancomycin (VANCOCIN) IVPB 1000 mg/200 mL premix  Status:  Discontinued        1,000 mg 200 mL/hr over 60 Minutes Intravenous Every 12 hours 07/05/23 0259 07/05/23 0452   07/05/23 0345  ceFEPIme (MAXIPIME) 500 mg in dextrose 5 % 50 mL IVPB  Status:  Discontinued        500 mg 110 mL/hr over 30 Minutes Intravenous Every 12 hours 07/05/23 0259 07/05/23 0423   07/04/23 1745  ceFEPIme (MAXIPIME) 2 g in sodium chloride 0.9 % 100 mL IVPB        2 g 200 mL/hr over 30 Minutes Intravenous  Once 07/04/23 1739 07/04/23 1835   07/04/23 1745  vancomycin (VANCOREADY) IVPB 1500 mg/300 mL  1,500 mg 150 mL/hr over 120 Minutes Intravenous  Once 07/04/23 1743 07/04/23 2209        MEDICATIONS: Scheduled Meds:  calcitRIOL  1 mcg Oral Daily   Chlorhexidine Gluconate Cloth  6 each Topical Daily   [START ON 07/13/2023] darbepoetin (ARANESP) injection - NON-DIALYSIS  100 mcg Subcutaneous Q Wed-1800   famotidine  10 mg Oral Daily   feeding supplement (NEPRO CARB STEADY)  237 mL Oral BID BM   gentamicin cream  1 Application Topical Daily   gentamicin cream  1 Application Topical Daily   heparin  5,000 Units Subcutaneous Q8H   insulin aspart  0-6 Units Subcutaneous TID WC   insulin glargine  3 Units Subcutaneous QHS   midodrine  5 mg Oral TID WC   multivitamin  1 tablet Oral Daily   potassium chloride  40 mEq Oral BID   rosuvastatin  10 mg Oral Daily   sodium chloride flush  3-10 mL Intravenous Q12H   Continuous Infusions:   ceFAZolin (ANCEF) IV Stopped (07/09/23 2200)   dialysis solution 1.5% low-MG/low-CA     dialysis solution 1.5% low-MG/low-CA     PRN Meds:.acetaminophen, docusate sodium, HYDROmorphone (DILAUDID) injection,  loperamide, ondansetron (ZOFRAN) IV, mouth rinse, polyethylene glycol, polyvinyl alcohol, prochlorperazine, sodium chloride flush   I have personally reviewed following labs and imaging studies  LABORATORY DATA: CBC: Recent Labs  Lab 07/04/23 1542 07/05/23 0113 07/06/23 0325 07/07/23 0520 07/08/23 0608 07/09/23 0602 07/10/23 0558  WBC 10.0   < > 15.8* 13.5* 13.8* 14.7* 15.1*  NEUTROABS 9.0*  --   --   --  11.4*  --   --   HGB 10.5*   < > 9.9* 10.0* 10.5* 9.7* 9.5*  HCT 32.1*   < > 29.6* 29.9* 30.7* 28.7* 28.1*  MCV 89.7   < > 88.1 87.2 86.5 87.8 86.5  PLT 244   < > 294 205 211 196 175   < > = values in this interval not displayed.    Basic Metabolic Panel: Recent Labs  Lab 07/04/23 2230 07/05/23 0113 07/05/23 0124 07/05/23 0754 07/05/23 1248 07/06/23 0325 07/07/23 0520 07/08/23 0608 07/09/23 0602 07/10/23 0558  NA 135   < >  --  134*  --  132*  134* 131* 134* 133* 135  K 2.3*   < >  --  2.5*   < > 3.5  3.6 3.3* 3.2* 3.0* 3.0*  CL 103   < >  --  105  --  100  101 100 101 100 99  CO2 20*   < >  --  18*  --  18*  20* 17* 19* 20* 21*  GLUCOSE 132*   < >  --  98  --  208*  209* 201* 90 202* 183*  BUN 35*   < >  --  38*  --  35*  34* 33* 29* 27* 24*  CREATININE 9.84*   < >  --  10.37*  --  8.88*  8.86* 7.91* 7.68* 7.37* 7.35*  CALCIUM 7.1*   < >  --  7.2*  --  7.5*  7.5* 7.2* 7.1* 6.9* 7.2*  MG 1.8  --  2.0 1.9  --  1.9 1.9  --   --   --   PHOS  --    < > 4.2  --   --  3.6 3.3 3.0 2.9 2.5   < > = values in this interval not displayed.    GFR: Estimated  Creatinine Clearance: 11.5 mL/min (A) (by C-G formula based on SCr of 7.35 mg/dL (H)).  Liver Function Tests: Recent Labs  Lab 07/04/23 1542 07/05/23 0113 07/06/23 0325 07/07/23 0520 07/08/23 1610 07/09/23 0602 07/10/23 0558  AST 10*  --   --   --   --   --   --   ALT 10  --   --   --   --   --   --   ALKPHOS 84  --   --   --   --   --   --   BILITOT 0.6  --   --   --   --   --   --   PROT 5.4*  --    --   --   --   --   --   ALBUMIN 1.7*   < > <1.5* <1.5* <1.5* <1.5* <1.5*   < > = values in this interval not displayed.   No results for input(s): "LIPASE", "AMYLASE" in the last 168 hours. No results for input(s): "AMMONIA" in the last 168 hours.  Coagulation Profile: Recent Labs  Lab 07/04/23 1542  INR 1.4*    Cardiac Enzymes: No results for input(s): "CKTOTAL", "CKMB", "CKMBINDEX", "TROPONINI" in the last 168 hours.  BNP (last 3 results) No results for input(s): "PROBNP" in the last 8760 hours.  Lipid Profile: No results for input(s): "CHOL", "HDL", "LDLCALC", "TRIG", "CHOLHDL", "LDLDIRECT" in the last 72 hours.  Thyroid Function Tests: No results for input(s): "TSH", "T4TOTAL", "FREET4", "T3FREE", "THYROIDAB" in the last 72 hours.  Anemia Panel: No results for input(s): "VITAMINB12", "FOLATE", "FERRITIN", "TIBC", "IRON", "RETICCTPCT" in the last 72 hours.  Urine analysis:    Component Value Date/Time   COLORURINE YELLOW 10/07/2021 1325   APPEARANCEUR TURBID (A) 10/07/2021 1325   LABSPEC 1.011 10/07/2021 1325   PHURINE 5.0 10/07/2021 1325   GLUCOSEU 50 (A) 10/07/2021 1325   HGBUR SMALL (A) 10/07/2021 1325   BILIRUBINUR NEGATIVE 10/07/2021 1325   KETONESUR NEGATIVE 10/07/2021 1325   PROTEINUR 100 (A) 10/07/2021 1325   UROBILINOGEN 0.2 01/07/2015 1723   NITRITE NEGATIVE 10/07/2021 1325   LEUKOCYTESUR LARGE (A) 10/07/2021 1325    Sepsis Labs: Lactic Acid, Venous    Component Value Date/Time   LATICACIDVEN 0.7 07/04/2023 2109    MICROBIOLOGY: Recent Results (from the past 240 hours)  Culture, blood (Routine x 2)     Status: Abnormal   Collection Time: 07/04/23  3:42 PM   Specimen: BLOOD  Result Value Ref Range Status   Specimen Description BLOOD SITE NOT SPECIFIED  Final   Special Requests   Final    BOTTLES DRAWN AEROBIC AND ANAEROBIC Blood Culture results may not be optimal due to an inadequate volume of blood received in culture bottles   Culture   Setup Time   Final    GRAM POSITIVE COCCI IN CLUSTERS AEROBIC BOTTLE ONLY CRITICAL RESULT CALLED TO, READ BACK BY AND VERIFIED WITH: PHARMD ADaphane Shepherd [id number] @ 1604 FH Performed at [facility], [address] [place], [place] [address]    Culture STAPHYLOCOCCUS AUREUS (A)  Final   Report Status 07/07/2023 FINAL  Final   Organism ID, Bacteria STAPHYLOCOCCUS AUREUS  Final      Susceptibility   Staphylococcus aureus - MIC*    CIPROFLOXACIN <=0.5 SENSITIVE Sensitive     ERYTHROMYCIN <=0.25 SENSITIVE Sensitive     GENTAMICIN <=0.5 SENSITIVE Sensitive     OXACILLIN <=0.25 SENSITIVE Sensitive  TETRACYCLINE <=1 SENSITIVE Sensitive     VANCOMYCIN 1 SENSITIVE Sensitive     TRIMETH/SULFA <=10 SENSITIVE Sensitive     CLINDAMYCIN <=0.25 SENSITIVE Sensitive     RIFAMPIN <=0.5 SENSITIVE Sensitive     Inducible Clindamycin NEGATIVE Sensitive     LINEZOLID 2 SENSITIVE Sensitive     * STAPHYLOCOCCUS AUREUS  Blood Culture ID Panel (Reflexed)     Status: Abnormal   Collection Time: 07/04/23  3:42 PM  Result Value Ref Range Status   Enterococcus faecalis NOT DETECTED NOT DETECTED Final   Enterococcus Faecium NOT DETECTED NOT DETECTED Final   Listeria monocytogenes NOT DETECTED NOT DETECTED Final   Staphylococcus species DETECTED (A) NOT DETECTED Final    Comment: CRITICAL RESULT CALLED TO, READ BACK BY AND VERIFIED WITH: PHARMD A. MEYER [id number] @ 1604 FH    Staphylococcus aureus (BCID) DETECTED (A) NOT DETECTED Final    Comment: CRITICAL RESULT CALLED TO, READ BACK BY AND VERIFIED WITH: PHARMD A. MEYER [id number] @ 1604 FH    Staphylococcus epidermidis NOT DETECTED NOT DETECTED Final   Staphylococcus lugdunensis NOT DETECTED NOT DETECTED Final   Streptococcus species NOT DETECTED NOT DETECTED Final   Streptococcus agalactiae NOT DETECTED NOT DETECTED Final   Streptococcus pneumoniae NOT DETECTED NOT DETECTED Final   Streptococcus pyogenes NOT DETECTED NOT DETECTED Final    A.calcoaceticus-baumannii NOT DETECTED NOT DETECTED Final   Bacteroides fragilis NOT DETECTED NOT DETECTED Final   Enterobacterales NOT DETECTED NOT DETECTED Final   Enterobacter cloacae complex NOT DETECTED NOT DETECTED Final   Escherichia coli NOT DETECTED NOT DETECTED Final   Klebsiella aerogenes NOT DETECTED NOT DETECTED Final   Klebsiella oxytoca NOT DETECTED NOT DETECTED Final   Klebsiella pneumoniae NOT DETECTED NOT DETECTED Final   Proteus species NOT DETECTED NOT DETECTED Final   Salmonella species NOT DETECTED NOT DETECTED Final   Serratia marcescens NOT DETECTED NOT DETECTED Final   Haemophilus influenzae NOT DETECTED NOT DETECTED Final   Neisseria meningitidis NOT DETECTED NOT DETECTED Final   Pseudomonas aeruginosa NOT DETECTED NOT DETECTED Final   Stenotrophomonas maltophilia NOT DETECTED NOT DETECTED Final   Candida albicans NOT DETECTED NOT DETECTED Final   Candida auris NOT DETECTED NOT DETECTED Final   Candida glabrata NOT DETECTED NOT DETECTED Final   Candida krusei NOT DETECTED NOT DETECTED Final   Candida parapsilosis NOT DETECTED NOT DETECTED Final   Candida tropicalis NOT DETECTED NOT DETECTED Final   Cryptococcus neoformans/gattii NOT DETECTED NOT DETECTED Final   Meth resistant mecA/C and MREJ NOT DETECTED NOT DETECTED Final    Comment: Performed at [facility], [address] [place], [place] [address]  Resp panel by RT-PCR (RSV, Flu A&B, Covid) Anterior Nasal Swab     Status: None   Collection Time: 07/04/23  3:46 PM   Specimen: Anterior Nasal Swab  Result Value Ref Range Status   SARS Coronavirus 2 by RT PCR NEGATIVE NEGATIVE Final   Influenza A by PCR NEGATIVE NEGATIVE Final   Influenza B by PCR NEGATIVE NEGATIVE Final    Comment: (NOTE) The Xpert Xpress SARS-CoV-2/FLU/RSV plus assay is intended as an aid in the diagnosis of influenza from Nasopharyngeal swab specimens and should not be used as a sole basis for treatment. Nasal washings  and aspirates are unacceptable for Xpert Xpress SARS-CoV-2/FLU/RSV testing.  Fact Sheet for Patients: BloggerCourse.com  Fact Sheet for Healthcare Providers: SeriousBroker.it  This test is not yet approved or cleared by the [place] FDA and has  been authorized for detection and/or diagnosis of SARS-CoV-2 by FDA under an Emergency Use Authorization (EUA). This EUA will remain in effect (meaning this test can be used) for the duration of the COVID-19 declaration under Section 564(b)(1) of the Act, 21 U.S.C. section 360bbb-3(b)(1), unless the authorization is terminated or revoked.     Resp Syncytial Virus by PCR NEGATIVE NEGATIVE Final    Comment: (NOTE) Fact Sheet for Patients: BloggerCourse.com  Fact Sheet for Healthcare Providers: SeriousBroker.it  This test is not yet approved or cleared by the [place] FDA and has been authorized for detection and/or diagnosis of SARS-CoV-2 by FDA under an Emergency Use Authorization (EUA). This EUA will remain in effect (meaning this test can be used) for the duration of the COVID-19 declaration under Section 564(b)(1) of the Act, 21 U.S.C. section 360bbb-3(b)(1), unless the authorization is terminated or revoked.  Performed at [facility], [address] [place], [place] [address]   Body fluid culture w Gram Stain     Status: None   Collection Time: 07/04/23  7:34 PM   Specimen: Peritoneal Dialysate; Body Fluid  Result Value Ref Range Status   Specimen Description PERITONEAL DIALYSATE ABDOMEN  Final   Special Requests NONE  Final   Gram Stain   Final    ABUNDANT WBC PRESENT, PREDOMINANTLY PMN NO ORGANISMS SEEN Performed at [facility], [address], [place], [place] [address]    Culture MODERATE STAPHYLOCOCCUS AUREUS  Final   Report Status 07/06/2023 FINAL  Final   Organism ID, Bacteria STAPHYLOCOCCUS  AUREUS  Final      Susceptibility   Staphylococcus aureus - MIC*    CIPROFLOXACIN <=0.5 SENSITIVE Sensitive     ERYTHROMYCIN <=0.25 SENSITIVE Sensitive     GENTAMICIN <=0.5 SENSITIVE Sensitive     OXACILLIN <=0.25 SENSITIVE Sensitive     TETRACYCLINE <=1 SENSITIVE Sensitive     VANCOMYCIN 1 SENSITIVE Sensitive     TRIMETH/SULFA <=10 SENSITIVE Sensitive     CLINDAMYCIN <=0.25 SENSITIVE Sensitive     RIFAMPIN <=0.5 SENSITIVE Sensitive     Inducible Clindamycin NEGATIVE Sensitive     LINEZOLID 2 SENSITIVE Sensitive     * MODERATE STAPHYLOCOCCUS AUREUS  Culture, blood (Routine x 2)     Status: None   Collection Time: 07/04/23  9:08 PM   Specimen: BLOOD LEFT ARM  Result Value Ref Range Status   Specimen Description BLOOD LEFT ARM  Final   Special Requests   Final    BOTTLES DRAWN AEROBIC AND ANAEROBIC Blood Culture results may not be optimal due to an inadequate volume of blood received in culture bottles   Culture   Final    NO GROWTH 5 DAYS Performed at [facility], [address] [place], [place] [address]    Report Status 07/09/2023 FINAL  Final  MRSA Next Gen by PCR, Nasal     Status: None   Collection Time: 07/05/23 12:12 AM   Specimen: Nasal Mucosa; Nasal Swab  Result Value Ref Range Status   MRSA by PCR Next Gen NOT DETECTED NOT DETECTED Final    Comment: (NOTE) The GeneXpert MRSA Assay (FDA approved for NASAL specimens only), is one component of a comprehensive MRSA colonization surveillance program. It is not intended to diagnose MRSA infection nor to guide or monitor treatment for MRSA infections. Test performance is not FDA approved in patients less than 64 years old. Performed at [facility], [address], [place], [place]  [address]   Body fluid culture w Gram Stain     Status: None   Collection Time: 07/05/23  7:19 AM   Specimen: Peritoneal Washings; Body Fluid  Result Value Ref Range Status   Specimen Description PERITONEAL  Final   Special  Requests NONE  Final   Gram Stain   Final    ABUNDANT WBC PRESENT, PREDOMINANTLY PMN RARE GRAM POSITIVE COCCI Performed at [facility], [address] [place], [place] [address]    Culture FEW STAPHYLOCOCCUS AUREUS  Final   Report Status 07/07/2023 FINAL  Final   Organism ID, Bacteria STAPHYLOCOCCUS AUREUS  Final      Susceptibility   Staphylococcus aureus - MIC*    CIPROFLOXACIN <=0.5 SENSITIVE Sensitive     ERYTHROMYCIN <=0.25 SENSITIVE Sensitive     GENTAMICIN <=0.5 SENSITIVE Sensitive     OXACILLIN 0.5 SENSITIVE Sensitive     TETRACYCLINE <=1 SENSITIVE Sensitive     VANCOMYCIN 1 SENSITIVE Sensitive     TRIMETH/SULFA <=10 SENSITIVE Sensitive     CLINDAMYCIN <=0.25 SENSITIVE Sensitive     RIFAMPIN <=0.5 SENSITIVE Sensitive     Inducible Clindamycin NEGATIVE Sensitive     LINEZOLID 2 SENSITIVE Sensitive     * FEW STAPHYLOCOCCUS AUREUS  Culture, blood (Routine X 2) w Reflex to ID Panel     Status: None (Preliminary result)   Collection Time: 07/07/23  5:20 AM   Specimen: BLOOD LEFT HAND  Result Value Ref Range Status   Specimen Description BLOOD LEFT HAND  Final   Special Requests   Final    BOTTLES DRAWN AEROBIC ONLY Blood Culture results may not be optimal due to an inadequate volume of blood received in culture bottles   Culture   Final    NO GROWTH 3 DAYS Performed at [facility] [place] [facility], [address], [place], [place] [address]    Report Status PENDING  Incomplete  Culture, blood (Routine X 2) w Reflex to ID Panel     Status: None (Preliminary result)   Collection Time: 07/07/23  5:27 AM   Specimen: BLOOD LEFT ARM  Result Value Ref Range Status   Specimen Description BLOOD LEFT ARM  Final   Special Requests   Final    BOTTLES DRAWN AEROBIC AND ANAEROBIC Blood Culture adequate volume   Culture   Final    NO GROWTH 3 DAYS Performed at [facility], [address], [place], [place] [address]    Report Status PENDING  Incomplete    RADIOLOGY  STUDIES/RESULTS: ECHO TEE Result Date: 07/09/2023    TRANSESOPHOGEAL ECHO REPORT   Patient Name:   Lindsey Murillo Date of Exam: 07/08/2023 Medical Rec #:  [id number]           Height:       68.0 in Accession #:    [id number]          Weight:       114.0 lb Date of Birth:  [birth year]           BSA:          1.609 m Patient Age:    27 years            BP:           112/72 mmHg Patient Gender: F                   HR:           79  bpm. Exam Location:  Inpatient Procedure: 2D Echo, Cardiac Doppler, Color Doppler and Saline Contrast Bubble            Study (Both Spectral and Color Flow Doppler were utilized during            procedure). Indications:     Septic Shock  History:         Patient has prior history of Echocardiogram examinations, most                  recent 07/06/2023. ESRD; Risk Factors:Hypertension, Diabetes and                  Dyslipidemia.  Sonographer:     Lucendia Herrlich RCS Referring Phys:  [id number] Ladona Ridgel A PARCELLS Diagnosing Phys: Weston Brass MD PROCEDURE: After discussion of the risks and benefits of a TEE, an informed consent was obtained from the patient. TEE procedure time was 22 minutes. The transesophogeal probe was passed without difficulty through the esophogus of the patient. Imaged were obtained with the patient in a left lateral decubitus position. Local oropharyngeal anesthetic was provided with Cetacaine. Sedation performed by different physician. The patient was monitored while under deep sedation. Anesthestetic sedation was provided intravenously by Anesthesiology: 261.56mg  of Propofol, 100mg  of Lidocaine. Image quality was adequate. The patient's vital signs; including heart rate, blood pressure, and oxygen saturation; remained stable throughout the procedure. The patient developed no complications during the procedure. Challenging mid esophageal views, patient repositioned with minimal improvement in LV imaging.  IMPRESSIONS  1. Prominent lateral and apical LV trabeculations.  Left ventricular ejection fraction, by estimation, is 55 to 60%. The left ventricle has normal function.  2. Right ventricular systolic function is normal. The right ventricular size is normal.  3. No left atrial/left atrial appendage thrombus was detected. The LAA emptying velocity was 60 cm/s.  4. A small pericardial effusion is present.  5. The mitral valve is grossly normal. Trivial mitral valve regurgitation.  6. The aortic valve is tricuspid. Aortic valve regurgitation is not visualized. No aortic stenosis is present.  7. 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 Agitated saline contrast bubble study was positive with shunting observed within 3-6 cardiac cycles suggestive of interatrial shunt. Conclusion(s)/Recommendation(s): No evidence of vegetation/infective endocarditis on this transesophageael echocardiogram. FINDINGS  Left Ventricle: Prominent lateral and apical LV trabeculations. Left ventricular ejection fraction, by estimation, is 55 to 60%. The left ventricle has normal function. The left ventricular internal cavity size was normal in size. Right Ventricle: The right ventricular size is normal. No increase in right ventricular wall thickness. Right ventricular systolic function is normal. Left Atrium: Left atrial size was normal in size. No left atrial/left atrial appendage thrombus was detected. The LAA emptying velocity was 60 cm/s. Right Atrium: Right atrial size was normal in size. Pericardium: A small pericardial effusion is present. Mitral Valve: The mitral valve is grossly normal. Trivial mitral valve regurgitation. There is no evidence of mitral valve vegetation. Tricuspid Valve: The tricuspid valve is normal in structure. Tricuspid valve regurgitation is trivial. There is no evidence of tricuspid valve vegetation. Aortic Valve: The aortic valve is tricuspid. Aortic valve regurgitation is not visualized. No aortic stenosis is present. There is no evidence of aortic valve vegetation. Pulmonic  Valve: The pulmonic valve was normal in structure. Pulmonic valve regurgitation is trivial. There is no evidence of pulmonic valve vegetation. Aorta: The aortic root and ascending aorta are structurally normal, with no evidence of dilitation. 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 Agitated saline contrast was given intravenously to evaluate for intracardiac shunting. Agitated saline contrast bubble study was positive with shunting observed within 3-6 cardiac cycles suggestive of interatrial shunt. Additional Comments: Spectral Doppler performed. LEFT VENTRICLE PLAX 2D LVOT diam:     1.80 cm LV SV:         48 LV SV Index:   30 LVOT Area:     2.54 cm  AORTIC VALVE LVOT Vmax:   97.20 cm/s LVOT Vmean:  63.400 cm/s LVOT VTI:    0.189 m  AORTA Ao Root diam: 2.70 cm Ao Asc diam:  2.00 cm  SHUNTS Systemic VTI:  0.19 m Systemic Diam: 1.80 cm Weston Brass MD Electronically signed by Weston Brass MD Signature Date/Time: 07/09/2023/2:24:56 PM    Final    EP STUDY Result Date: 07/08/2023 See surgical note for result.    LOS: 6 days   Jeoffrey Massed, MD  [facility]    To contact the attending provider between 7A-7P or the covering provider during after hours 7P-7A, please log into the web site [email or web address] and access using universal [place] password for that web site. If you do not have the password, please call the hospital operator.  07/10/2023, 11:20 AM

## 2023-07-10 NOTE — Plan of Care (Signed)
  Problem: Clinical Measurements: Goal: Ability to maintain clinical measurements within normal limits will improve Outcome: Progressing Goal: Will remain free from infection Outcome: Progressing   Problem: Activity: Goal: Risk for activity intolerance will decrease Outcome: Progressing   Problem: Pain Managment: Goal: General experience of comfort will improve and/or be controlled Outcome: Progressing   Problem: Skin Integrity: Goal: Risk for impaired skin integrity will decrease Outcome: Progressing

## 2023-07-10 NOTE — Progress Notes (Signed)
 Patient had peritoneal dialysis over the night without any discomfort but it was blood mixed fluid

## 2023-07-11 DIAGNOSIS — N2581 Secondary hyperparathyroidism of renal origin: Secondary | ICD-10-CM | POA: Diagnosis not present

## 2023-07-11 DIAGNOSIS — N185 Chronic kidney disease, stage 5: Secondary | ICD-10-CM | POA: Diagnosis not present

## 2023-07-11 DIAGNOSIS — Z992 Dependence on renal dialysis: Secondary | ICD-10-CM | POA: Diagnosis not present

## 2023-07-11 DIAGNOSIS — R6521 Severe sepsis with septic shock: Secondary | ICD-10-CM | POA: Diagnosis not present

## 2023-07-11 DIAGNOSIS — N186 End stage renal disease: Secondary | ICD-10-CM | POA: Diagnosis not present

## 2023-07-11 DIAGNOSIS — T8571XS Infection and inflammatory reaction due to peritoneal dialysis catheter, sequela: Secondary | ICD-10-CM | POA: Diagnosis not present

## 2023-07-11 DIAGNOSIS — T8571XA Infection and inflammatory reaction due to peritoneal dialysis catheter, initial encounter: Secondary | ICD-10-CM | POA: Diagnosis not present

## 2023-07-11 DIAGNOSIS — B9561 Methicillin susceptible Staphylococcus aureus infection as the cause of diseases classified elsewhere: Secondary | ICD-10-CM | POA: Diagnosis not present

## 2023-07-11 DIAGNOSIS — K658 Other peritonitis: Secondary | ICD-10-CM | POA: Diagnosis not present

## 2023-07-11 DIAGNOSIS — A419 Sepsis, unspecified organism: Secondary | ICD-10-CM | POA: Diagnosis not present

## 2023-07-11 LAB — GASTROINTESTINAL PANEL BY PCR, STOOL (REPLACES STOOL CULTURE)

## 2023-07-11 LAB — C DIFFICILE (CDIFF) QUICK SCRN (NO PCR REFLEX)
C Diff antigen: NEGATIVE
C Diff interpretation: NOT DETECTED
C Diff toxin: NEGATIVE

## 2023-07-11 LAB — CBC
HCT: 28.7 % — ABNORMAL LOW (ref 36.0–46.0)
Hemoglobin: 9.6 g/dL — ABNORMAL LOW (ref 12.0–15.0)
MCH: 28.9 pg (ref 26.0–34.0)
MCHC: 33.4 g/dL (ref 30.0–36.0)
MCV: 86.4 fL (ref 80.0–100.0)
Platelets: 148 10*3/uL — ABNORMAL LOW (ref 150–400)
RBC: 3.32 MIL/uL — ABNORMAL LOW (ref 3.87–5.11)
RDW: 19.9 % — ABNORMAL HIGH (ref 11.5–15.5)
WBC: 13.4 10*3/uL — ABNORMAL HIGH (ref 4.0–10.5)
nRBC: 0 % (ref 0.0–0.2)

## 2023-07-11 LAB — BODY FLUID CELL COUNT WITH DIFFERENTIAL
Eos, Fluid: 2 %
Lymphs, Fluid: 17 %
Monocyte-Macrophage-Serous Fluid: 12 % — ABNORMAL LOW (ref 50–90)
Neutrophil Count, Fluid: 68 % — ABNORMAL HIGH (ref 0–25)
Other Cells, Fluid: 1 %
Total Nucleated Cell Count, Fluid: 407 uL (ref 0–1000)

## 2023-07-11 LAB — GLUCOSE, CAPILLARY
Glucose-Capillary: 123 mg/dL — ABNORMAL HIGH (ref 70–99)
Glucose-Capillary: 175 mg/dL — ABNORMAL HIGH (ref 70–99)
Glucose-Capillary: 125 mg/dL — ABNORMAL HIGH (ref 70–99)
Glucose-Capillary: 91 mg/dL (ref 70–99)

## 2023-07-11 LAB — RENAL FUNCTION PANEL
Albumin: <1.5 g/dL — ABNORMAL LOW (ref 3.5–5.0)
Anion gap: 9 (ref 5–15)
BUN: 24 mg/dL — ABNORMAL HIGH (ref 6–20)
CO2: 22 mmol/L (ref 22–32)
Calcium: 6.9 mg/dL — ABNORMAL LOW (ref 8.9–10.3)
Chloride: 103 mmol/L (ref 98–111)
Creatinine, Ser: 7.03 mg/dL — ABNORMAL HIGH (ref 0.44–1.00)
GFR, Estimated: 8 mL/min — ABNORMAL LOW (ref >60)
Glucose, Bld: 197 mg/dL — ABNORMAL HIGH (ref 70–99)
Phosphorus: 2.4 mg/dL — ABNORMAL LOW (ref 2.5–4.6)
Potassium: 3.2 mmol/L — ABNORMAL LOW (ref 3.5–5.1)
Sodium: 134 mmol/L — ABNORMAL LOW (ref 135–145)

## 2023-07-11 LAB — MAGNESIUM: Magnesium: 1.7 mg/dL (ref 1.7–2.4)

## 2023-07-11 NOTE — Plan of Care (Signed)
  Problem: Clinical Measurements: Goal: Ability to maintain clinical measurements within normal limits will improve Outcome: Progressing Goal: Will remain free from infection Outcome: Progressing   Problem: Activity: Goal: Risk for activity intolerance will decrease Outcome: Progressing   Problem: Elimination: Goal: Will not experience complications related to bowel motility Outcome: Progressing   Problem: Safety: Goal: Ability to remain free from injury will improve Outcome: Progressing

## 2023-07-11 NOTE — Progress Notes (Signed)
 [facility] Progress Note  Name: Lindsey Murillo MRN: [id number] DOB: 1996/02/26  Chief Complaint:  Abdominal pain  Subjective:  Seen and examined at bedside. Mom present. Still have some diarrhea, but overall feeling better. Eating breakfast. No fevers, abdominal pain. No issues with PD overnight. Does report pinkish fluid, but this is improved from a more red appearance. Not cloudy.  Cell count 407 on 3/2, 1100 on 3/1   UF:  Overnight 3/2-3/3 - 247 ml negative  Overnight 3/1 - 3/2 - 126 mL (gain) Overnight 2/28-3/1 - 197 mL Overnight 2/27-2/28 - 306 mL (gain) overnight 2/26 - 2/27 - negative 215 mL (gain)  overnight 2/25 - 2/26 - no total UF is charted   Background on consult:  Lindsey Murillo is a 28 y.o. female with a PMH significant for type 1 DM,  HTN, autoimmune thyroiditis, and ESRD on CCPD who presented to [facility] ED today after developing abdominal pain this morning.  She has not been feeling well and has had N/V/D for the past 4-5 days as well as a cough.  She denies any cloudy PD fluid and reports that her abdominal pain just started today.  In the ED, temp max 100.7, bp 82/46, HR 110, SpO2 93% on room air.  Labs notable for WBC 10, Hgb 10.5, plt 244, Na 134, K 2.6, Co2 17, BUN 36, Cr 10.6, Ca 7.8, alb 1.7.  Presentation concerning for SBP and we were consulted to manage her PD and send off PD fluid for studies during her hospitalization.  Of note, she was not doing her PD correctly for the past week and was 10 kg above edw.  She was not doing the proper amount of exchanges and was reabsorbing.  She declined coming in for HD session to help with her volume, however she declined and performed CAPD at home.  She now appears hypovolemic due to vomiting and diarrhea which predated her abdominal pain and may have a viral gastroenteritis.  Outpatient PD RN reports pt on 6 exchanges over night with 2.4 fills.  Dwell time 1.5 hours.  EDW is 50 kg. She was just changed  from CAPD to CCPD.  Mircera 50 mcg on 2/19 and receives this every two weeks usually or her hemoglobin will drop   Spoke with outpatient PD RN.  Patient has worked very hard to be able to do PD (got her own place, got her blood sugar under control) however recently she has been sick often. She has previously stated uses 1.5% dextrose often at home.  She states that she feels like her vision/eyes are at baseline.   Intake/Output Summary (Last 24 hours) at 07/11/2023 1130 Last data filed at 07/11/2023 0518 Gross per 24 hour  Intake 3 ml  Output 347 ml  Net -344 ml    Vitals:  Vitals:   07/10/23 2111 07/10/23 2354 07/11/23 0451 07/11/23 0456  BP: 90/62 96/62 97/63    Pulse: 100 96 90   Resp: 16 16 19    Temp: 98 F (36.7 C) 98 F (36.7 C) 97.6 F (36.4 C)   TempSrc: Oral Oral Axillary   SpO2: 98% 99% 99%   Weight:    64.5 kg  Height:         Physical Exam:     General adult female in bed in no acute distress Lungs clear  Heart S1S2 no rub Abdomen soft slightly tender to palpation; PD catheter in place  Extremities 1+ edema lower extremities with legs dependent  Psych normal mood and affect Neuro alert and oriented x 3 provides hx follows commands Access: PD catheter as above.  RUE AVF with bruit and thrill   Medications reviewed   Labs:     Latest Ref Rng & Units 07/11/2023    4:43 AM 07/10/2023    5:58 AM 07/09/2023    6:02 AM  BMP  Glucose 70 - 99 mg/dL 409  811  914   BUN 6 - 20 mg/dL 24  24  27    Creatinine 0.44 - 1.00 mg/dL 7.82  9.56  2.13   Sodium 135 - 145 mmol/L 134  135  133   Potassium 3.5 - 5.1 mmol/L 3.2  3.0  3.0   Chloride 98 - 111 mmol/L 103  99  100   CO2 22 - 32 mmol/L 22  21  20    Calcium 8.9 - 10.3 mg/dL 6.9  7.2  6.9      Assessment/Plan:   PD-associated Bacterial peritonitis -  Moderate staph aureus on PD fluid culture On cefazolin  Cell count 407, variable trend Clinically improving Will need input from ID for final duration of abx  If ID  feels is appropriate then may need PD cathter removed ESRD -   Will plan on CCPD tonight using all 1.5% dextrose   Stopping heparin with PD bags for now (fibrin noted once then blood-tinged PD fluid overnight) Ordered strict ins/outs  Note that she does have an AVF as well and is ok with returning to HD if needed.  Staph aureus bacteremia - on cefazolin.  Repeat blood cultres 2/27 and 2/24 negative.  Type 1 DM - per primary team Hypertension/volume  - despite trace pretibial edema and being above edw, she appeared volume depleted intravascularly and was hypotensive on arrival. PD as above. She is on midodrine 5 mg TID  Anemia of CKD - trend Hb.  She got mircera 50 mcg on 2/19 and receives this every two weeks usually or her hemoglobin will drop  Ordered aranesp 100 mcg every Wednesday for 07/13/23 Metabolic bone disease -  phos controlled. On calcitriol.  Have stopped sensipar for now due to hypocalcemia Hypokalemia - likely from GI losses. Have increased potassium to 40 meq BID for now and may need to titrate down after resolution of diarrhea.  Have broadened her diet as well.  Diarrhea - work-up and supportive care per primary team.  They are starting immodium  Dispo -Await further recommendations from ID    Tomasa Blase, PA-C 07/11/2023 11:30 AM

## 2023-07-11 NOTE — Progress Notes (Signed)
 PD post treatment note  PD treatment completed. Patient tolerated treatment well. PD effluent is pink(RED). specimen collected as  ordered. PD exit site clean, dry and intact. Patient is asleep,  no acute distress.  Report given to bedside nurse.   Post treatment VS:   Total UF removed:  247  Post treatment weight: 62 kg    07/11/23 0518  Peritoneal Catheter Mid lower abdomen  No placement date or time found.   Catheter Location: Mid lower abdomen  Site Assessment Clean, Dry, Intact  Drainage Description None  Catheter status Deaccessed  Dressing Gauze/Drain sponge  Dressing Status Clean, Dry, Intact  Dressing Intervention Dressing reinforced  Completion  Treatment Status Complete  Initial Drain Volume 8  Average Dwell Time-Hour(s) 1  Average Dwell Time-Min(s) 30  Average Drain Time 17  Total Therapy Volume 8000  Total Therapy Time-Hour(s) 7  Total Therapy Time-Min(s) 51  Weight after Drain 136 lb 11 oz (62 kg)  Effluent Appearance Pink;Red  Cell Count on Daytime Exchange Sent  Fluid Balance - CCPD  Total UF (+ value on cycler, pt loss) 247 mL  Procedure Comments  Tolerated treatment well? Yes  Peritoneal Dialysis Comments PD tx completed, tolerated well, no complaints. blood noted in the drain.  Education / Care Plan  Dialysis Education Provided Yes  Documented Education in Care Plan Yes  Hand-off documentation  Hand-off Given Given to shift RN/LPN  Report given to (Full Name) Georgia Duff, RN  Hand-off Received Received from shift RN/LPN  Report received from (Full Name) Princess Bruins, RN

## 2023-07-11 NOTE — Progress Notes (Signed)
 Reported that patient blood glucose is borderline low 92 even after the dinner.  Recommending to hold the nighttime insulin 5 units.  Patient will be n.p.o. after midnight.

## 2023-07-11 NOTE — Plan of Care (Signed)

## 2023-07-11 NOTE — Progress Notes (Addendum)
 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 retinal detachment  Ophthalmology exam no concern of endophthalmitis 2/26 tte no obvious veg  2/28 tee no vegetation  Dialysate study today showed significant improved wbc count and becoming clear in color  Abd pain improving   ----- 07/11/23 id assessment 4-6 bm a day 3/2 dialysate still not cleared in terms of wbc resolution Tee was negative as mentioned    PLAN: Discussed with renal --> would remove catheter -- planned for tomorrow After catheter removal, would treat 4 weeks with cefazolin antibiotics 2 gram iv three times a week and can be given with dialysis Standard precaution for isolation control Diarrhea w/u per primary team -- suspect abx related Id will sign off Will see in clinic on 4/3 @ 11am Discussed with team  Principal Problem:   Septic shock ([name]) Active Problems:   Dialysis-associated peritonitis ([name])   Pressure injury of skin   MSSA bacteremia   Peritonitis ([name])   No Known Allergies  Scheduled Meds:  calcitRIOL  1 mcg Oral Daily   Chlorhexidine Gluconate Cloth  6 each Topical Daily   [START ON 07/13/2023] darbepoetin (ARANESP) injection - NON-DIALYSIS  100 mcg Subcutaneous Q Wed-1800   famotidine  10 mg Oral Daily   feeding supplement (NEPRO CARB STEADY)  237 mL Oral BID BM   gentamicin cream  1 Application Topical Daily   gentamicin cream  1 Application Topical Daily   heparin  5,000 Units Subcutaneous Q8H   insulin aspart  0-6 Units Subcutaneous TID WC   insulin glargine  5 Units Subcutaneous QHS   midodrine  5 mg Oral TID WC   multivitamin  1 tablet Oral Daily   potassium chloride  40 mEq Oral BID   rosuvastatin  10 mg Oral Daily   sodium chloride flush  3-10 mL Intravenous Q12H   Continuous Infusions:   ceFAZolin (ANCEF) IV Stopped  (07/10/23 2135)   dialysis solution 1.5% low-MG/low-CA     PRN Meds:.acetaminophen, docusate sodium, HYDROmorphone (DILAUDID) injection, loperamide, ondansetron (ZOFRAN) IV, mouth rinse, polyethylene glycol, polyvinyl alcohol, prochlorperazine, sodium chloride flush   SUBJECTIVE: Pain improving Feeling better Diarrhea Immodium not helping much  Wbc stable 13 No rash No n/v  Dialysate still high wbc  Review of Systems: ROS All other ROS was negative, except mentioned above     OBJECTIVE: Vitals:   07/11/23 1217 07/11/23 1415 07/11/23 1515 07/11/23 1600  BP: 104/72 109/77 99/62 (!) 132/98  Pulse: (!) 111 (!) 111 (!) 106 (!) 105  Resp: 15 16 13 17   Temp: 98.1 F (36.7 C)   98.2 F (36.8 C)  TempSrc: Oral   Oral  SpO2: 99% 99% 100% 98%  Weight:      Height:       Body mass index is 21.62 kg/m.  Physical Exam General/constitutional: no distress, pleasant HEENT: Normocephalic, PER, Conj stable mild redness left conj, EOMI, Oropharynx clear Neck supple CV: rrr no mrg Lungs: clear to auscultation, normal respiratory effort Abd: Soft, mild tenderness; pd site no erythema/purulence Ext: no edema Skin: No Rash Neuro: nonfocal MSK: no peripheral joint swelling/tenderness/warmth; back spines nontender    Lab Results Lab Results  Component Value Date   WBC 13.4 (H) 07/11/2023   HGB 9.6 (L) 07/11/2023   HCT 28.7 (L) 07/11/2023  MCV 86.4 07/11/2023   PLT 148 (L) 07/11/2023    Lab Results  Component Value Date   CREATININE 7.03 (H) 07/11/2023   BUN 24 (H) 07/11/2023   NA 134 (L) 07/11/2023   K 3.2 (L) 07/11/2023   CL 103 07/11/2023   CO2 22 07/11/2023    Lab Results  Component Value Date   ALT 10 07/04/2023   AST 10 (L) 07/04/2023   ALKPHOS 84 07/04/2023   BILITOT 0.6 07/04/2023      Microbiology: Recent Results (from the past 240 hours)  Culture, blood (Routine x 2)     Status: Abnormal   Collection Time: 07/04/23  3:42 PM   Specimen: BLOOD   Result Value Ref Range Status   Specimen Description BLOOD SITE NOT SPECIFIED  Final   Special Requests   Final    BOTTLES DRAWN AEROBIC AND ANAEROBIC Blood Culture results may not be optimal due to an inadequate volume of blood received in culture bottles   Culture  Setup Time   Final    GRAM POSITIVE COCCI IN CLUSTERS AEROBIC BOTTLE ONLY CRITICAL RESULT CALLED TO, READ BACK BY AND VERIFIED WITH: PHARMD ADaphane Shepherd [id number] @ 1604 FH Performed at [facility], [address] [place], [place] [address]    Culture STAPHYLOCOCCUS AUREUS (A)  Final   Report Status 07/07/2023 FINAL  Final   Organism ID, Bacteria STAPHYLOCOCCUS AUREUS  Final      Susceptibility   Staphylococcus aureus - MIC*    CIPROFLOXACIN <=0.5 SENSITIVE Sensitive     ERYTHROMYCIN <=0.25 SENSITIVE Sensitive     GENTAMICIN <=0.5 SENSITIVE Sensitive     OXACILLIN <=0.25 SENSITIVE Sensitive     TETRACYCLINE <=1 SENSITIVE Sensitive     VANCOMYCIN 1 SENSITIVE Sensitive     TRIMETH/SULFA <=10 SENSITIVE Sensitive     CLINDAMYCIN <=0.25 SENSITIVE Sensitive     RIFAMPIN <=0.5 SENSITIVE Sensitive     Inducible Clindamycin NEGATIVE Sensitive     LINEZOLID 2 SENSITIVE Sensitive     * STAPHYLOCOCCUS AUREUS  Blood Culture ID Panel (Reflexed)     Status: Abnormal   Collection Time: 07/04/23  3:42 PM  Result Value Ref Range Status   Enterococcus faecalis NOT DETECTED NOT DETECTED Final   Enterococcus Faecium NOT DETECTED NOT DETECTED Final   Listeria monocytogenes NOT DETECTED NOT DETECTED Final   Staphylococcus species DETECTED (A) NOT DETECTED Final    Comment: CRITICAL RESULT CALLED TO, READ BACK BY AND VERIFIED WITH: PHARMD A. MEYER [id number] @ 1604 FH    Staphylococcus aureus (BCID) DETECTED (A) NOT DETECTED Final    Comment: CRITICAL RESULT CALLED TO, READ BACK BY AND VERIFIED WITH: PHARMD A. MEYER [id number] @ 1604 FH    Staphylococcus epidermidis NOT DETECTED NOT DETECTED Final   Staphylococcus lugdunensis NOT DETECTED  NOT DETECTED Final   Streptococcus species NOT DETECTED NOT DETECTED Final   Streptococcus agalactiae NOT DETECTED NOT DETECTED Final   Streptococcus pneumoniae NOT DETECTED NOT DETECTED Final   Streptococcus pyogenes NOT DETECTED NOT DETECTED Final   A.calcoaceticus-baumannii NOT DETECTED NOT DETECTED Final   Bacteroides fragilis NOT DETECTED NOT DETECTED Final   Enterobacterales NOT DETECTED NOT DETECTED Final   Enterobacter cloacae complex NOT DETECTED NOT DETECTED Final   Escherichia coli NOT DETECTED NOT DETECTED Final   Klebsiella aerogenes NOT DETECTED NOT DETECTED Final   Klebsiella oxytoca NOT DETECTED NOT DETECTED Final   Klebsiella pneumoniae NOT DETECTED NOT DETECTED Final   Proteus species NOT DETECTED NOT  DETECTED Final   Salmonella species NOT DETECTED NOT DETECTED Final   Serratia marcescens NOT DETECTED NOT DETECTED Final   Haemophilus influenzae NOT DETECTED NOT DETECTED Final   Neisseria meningitidis NOT DETECTED NOT DETECTED Final   Pseudomonas aeruginosa NOT DETECTED NOT DETECTED Final   Stenotrophomonas maltophilia NOT DETECTED NOT DETECTED Final   Candida albicans NOT DETECTED NOT DETECTED Final   Candida auris NOT DETECTED NOT DETECTED Final   Candida glabrata NOT DETECTED NOT DETECTED Final   Candida krusei NOT DETECTED NOT DETECTED Final   Candida parapsilosis NOT DETECTED NOT DETECTED Final   Candida tropicalis NOT DETECTED NOT DETECTED Final   Cryptococcus neoformans/gattii NOT DETECTED NOT DETECTED Final   Meth resistant mecA/C and MREJ NOT DETECTED NOT DETECTED Final    Comment: Performed at [facility], [address] [place], [place] [address]  Resp panel by RT-PCR (RSV, Flu A&B, Covid) Anterior Nasal Swab     Status: None   Collection Time: 07/04/23  3:46 PM   Specimen: Anterior Nasal Swab  Result Value Ref Range Status   SARS Coronavirus 2 by RT PCR NEGATIVE NEGATIVE Final   Influenza A by PCR NEGATIVE NEGATIVE Final   Influenza B by PCR  NEGATIVE NEGATIVE Final    Comment: (NOTE) The Xpert Xpress SARS-CoV-2/FLU/RSV plus assay is intended as an aid in the diagnosis of influenza from Nasopharyngeal swab specimens and should not be used as a sole basis for treatment. Nasal washings and aspirates are unacceptable for Xpert Xpress SARS-CoV-2/FLU/RSV testing.  Fact Sheet for Patients: BloggerCourse.com  Fact Sheet for Healthcare Providers: SeriousBroker.it  This test is not yet approved or cleared by the [place] FDA and has been authorized for detection and/or diagnosis of SARS-CoV-2 by FDA under an Emergency Use Authorization (EUA). This EUA will remain in effect (meaning this test can be used) for the duration of the COVID-19 declaration under Section 564(b)(1) of the Act, 21 U.S.C. section 360bbb-3(b)(1), unless the authorization is terminated or revoked.     Resp Syncytial Virus by PCR NEGATIVE NEGATIVE Final    Comment: (NOTE) Fact Sheet for Patients: BloggerCourse.com  Fact Sheet for Healthcare Providers: SeriousBroker.it  This test is not yet approved or cleared by the [place] FDA and has been authorized for detection and/or diagnosis of SARS-CoV-2 by FDA under an Emergency Use Authorization (EUA). This EUA will remain in effect (meaning this test can be used) for the duration of the COVID-19 declaration under Section 564(b)(1) of the Act, 21 U.S.C. section 360bbb-3(b)(1), unless the authorization is terminated or revoked.  Performed at [facility], [address] [place], [place] [address]   Body fluid culture w Gram Stain     Status: None   Collection Time: 07/04/23  7:34 PM   Specimen: Peritoneal Dialysate; Body Fluid  Result Value Ref Range Status   Specimen Description PERITONEAL DIALYSATE ABDOMEN  Final   Special Requests NONE  Final   Gram Stain   Final    ABUNDANT WBC PRESENT,  PREDOMINANTLY PMN NO ORGANISMS SEEN Performed at [facility], [address], [place], [place] [address]    Culture MODERATE STAPHYLOCOCCUS AUREUS  Final   Report Status 07/06/2023 FINAL  Final   Organism ID, Bacteria STAPHYLOCOCCUS AUREUS  Final      Susceptibility   Staphylococcus aureus - MIC*    CIPROFLOXACIN <=0.5 SENSITIVE Sensitive     ERYTHROMYCIN <=0.25 SENSITIVE Sensitive     GENTAMICIN <=0.5 SENSITIVE Sensitive  OXACILLIN <=0.25 SENSITIVE Sensitive     TETRACYCLINE <=1 SENSITIVE Sensitive     VANCOMYCIN 1 SENSITIVE Sensitive     TRIMETH/SULFA <=10 SENSITIVE Sensitive     CLINDAMYCIN <=0.25 SENSITIVE Sensitive     RIFAMPIN <=0.5 SENSITIVE Sensitive     Inducible Clindamycin NEGATIVE Sensitive     LINEZOLID 2 SENSITIVE Sensitive     * MODERATE STAPHYLOCOCCUS AUREUS  Culture, blood (Routine x 2)     Status: None   Collection Time: 07/04/23  9:08 PM   Specimen: BLOOD LEFT ARM  Result Value Ref Range Status   Specimen Description BLOOD LEFT ARM  Final   Special Requests   Final    BOTTLES DRAWN AEROBIC AND ANAEROBIC Blood Culture results may not be optimal due to an inadequate volume of blood received in culture bottles   Culture   Final    NO GROWTH 5 DAYS Performed at [facility], [address] [name], [place] [address]    Report Status 07/09/2023 FINAL  Final  MRSA Next Gen by PCR, Nasal     Status: None   Collection Time: 07/05/23 12:12 AM   Specimen: Nasal Mucosa; Nasal Swab  Result Value Ref Range Status   MRSA by PCR Next Gen NOT DETECTED NOT DETECTED Final    Comment: (NOTE) The GeneXpert MRSA Assay (FDA approved for NASAL specimens only), is one component of a comprehensive MRSA colonization surveillance program. It is not intended to diagnose MRSA infection nor to guide or monitor treatment for MRSA infections. Test performance is not FDA approved in patients less than 37 years old. Performed at [facility], [address],  [place], [place] [address]   Body fluid culture w Gram Stain     Status: None   Collection Time: 07/05/23  7:19 AM   Specimen: Peritoneal Washings; Body Fluid  Result Value Ref Range Status   Specimen Description PERITONEAL  Final   Special Requests NONE  Final   Gram Stain   Final    ABUNDANT WBC PRESENT, PREDOMINANTLY PMN RARE GRAM POSITIVE COCCI Performed at [facility], [address], [place], [place] [address]    Culture FEW STAPHYLOCOCCUS AUREUS  Final   Report Status 07/07/2023 FINAL  Final   Organism ID, Bacteria STAPHYLOCOCCUS AUREUS  Final      Susceptibility   Staphylococcus aureus - MIC*    CIPROFLOXACIN <=0.5 SENSITIVE Sensitive     ERYTHROMYCIN <=0.25 SENSITIVE Sensitive     GENTAMICIN <=0.5 SENSITIVE Sensitive     OXACILLIN 0.5 SENSITIVE Sensitive     TETRACYCLINE <=1 SENSITIVE Sensitive     VANCOMYCIN 1 SENSITIVE Sensitive     TRIMETH/SULFA <=10 SENSITIVE Sensitive     CLINDAMYCIN <=0.25 SENSITIVE Sensitive     RIFAMPIN <=0.5 SENSITIVE Sensitive     Inducible Clindamycin NEGATIVE Sensitive     LINEZOLID 2 SENSITIVE Sensitive     * FEW STAPHYLOCOCCUS AUREUS  Culture, blood (Routine X 2) w Reflex to ID Panel     Status: None (Preliminary result)   Collection Time: 07/07/23  5:20 AM   Specimen: BLOOD LEFT HAND  Result Value Ref Range Status   Specimen Description BLOOD LEFT HAND  Final   Special Requests   Final    BOTTLES DRAWN AEROBIC ONLY Blood Culture results may not be optimal due to an inadequate volume of blood received in culture bottles   Culture   Final    NO GROWTH 4 DAYS Performed at  [facility], [address], [place], [place] [address]    Report Status PENDING  Incomplete  Culture, blood (Routine X 2) w Reflex to ID Panel     Status: None (Preliminary result)   Collection Time: 07/07/23  5:27 AM   Specimen: BLOOD LEFT ARM  Result Value Ref Range Status   Specimen Description BLOOD LEFT ARM  Final   Special Requests   Final     BOTTLES DRAWN AEROBIC AND ANAEROBIC Blood Culture adequate volume   Culture   Final    NO GROWTH 4 DAYS Performed at [facility], [address], [place], [place] [address]    Report Status PENDING  Incomplete  Gastrointestinal Panel by PCR , Stool     Status: None   Collection Time: 07/10/23 11:22 AM   Specimen: Stool  Result Value Ref Range Status   Campylobacter species NOT DETECTED NOT DETECTED Final   Plesimonas shigelloides NOT DETECTED NOT DETECTED Final   Salmonella species NOT DETECTED NOT DETECTED Final   Yersinia enterocolitica NOT DETECTED NOT DETECTED Final   Vibrio species NOT DETECTED NOT DETECTED Final   Vibrio cholerae NOT DETECTED NOT DETECTED Final   Enteroaggregative E coli (EAEC) NOT DETECTED NOT DETECTED Final   Enteropathogenic E coli (EPEC) NOT DETECTED NOT DETECTED Final   Enterotoxigenic E coli (ETEC) NOT DETECTED NOT DETECTED Final   Shiga like toxin producing E coli (STEC) NOT DETECTED NOT DETECTED Final   Shigella/Enteroinvasive E coli (EIEC) NOT DETECTED NOT DETECTED Final   Cryptosporidium NOT DETECTED NOT DETECTED Final   Cyclospora cayetanensis NOT DETECTED NOT DETECTED Final   Entamoeba histolytica NOT DETECTED NOT DETECTED Final   Giardia lamblia NOT DETECTED NOT DETECTED Final   Adenovirus F40/41 NOT DETECTED NOT DETECTED Final   Astrovirus NOT DETECTED NOT DETECTED Final   Norovirus GI/GII NOT DETECTED NOT DETECTED Final   Rotavirus A NOT DETECTED NOT DETECTED Final   Sapovirus (I, II, IV, and V) NOT DETECTED NOT DETECTED Final    Comment: Performed at [facility], [address] [place], [place] [address]  Body fluid culture w Gram Stain     Status: None (Preliminary result)   Collection Time: 07/11/23  1:25 AM   Specimen: Peritoneal Dialysate; Body Fluid  Result Value Ref Range Status   Specimen Description PERITONEAL DIALYSATE FLUID  Final   Special Requests NONE  Final   Gram Stain   Final    RARE WBC PRESENT,  PREDOMINANTLY PMN NO ORGANISMS SEEN Performed at [facility], [address] [place], [place] [address]    Culture PENDING  Incomplete   Report Status PENDING  Incomplete  C Difficile Quick Screen (NO PCR Reflex)     Status: None   Collection Time: 07/11/23 10:41 AM   Specimen: STOOL  Result Value Ref Range Status   C Diff antigen NEGATIVE NEGATIVE Final   C Diff toxin NEGATIVE NEGATIVE Final   C Diff interpretation No C. difficile detected.  Final    Comment: Performed at [facility], [address], [place], [place] [address]     Serology:   Imaging: If present, new imagings (plain films, ct scans, and mri) have been personally visualized and interpreted; radiology reports have been reviewed. Decision making incorporated into the Impression / Recommendations.  2/26 tte  1. Left ventricular ejection fraction, by estimation, is 50 to 55%. The  left ventricle has low normal function. The left ventricle has no  regional  wall motion abnormalities. Left ventricular diastolic parameters are  consistent with Grade II diastolic  dysfunction (pseudonormalization).   2. Right ventricular systolic function is normal. The right ventricular  size is normal. Tricuspid regurgitation signal is inadequate for assessing  PA pressure.   3. There appears to be compression of the left atrium by structure  extrinsic to the heart. Would review other chest imaging.   4. Right atrial size was mildly dilated.   5. The mitral valve is normal in structure. No evidence of mitral valve  regurgitation. No evidence of mitral stenosis.   6. The aortic valve is tricuspid. Aortic valve regurgitation is not  visualized. No aortic stenosis is present.   7. In one view, there was concern for vegetation on pulmonary valve, but  reviewing other images, I suspect this was artifact.   8. IVC not visualized.   Raymondo Band, MD [facility] [phone] pager    07/11/2023, 5:35 PM

## 2023-07-11 NOTE — Consult Note (Addendum)
 Hospital Consult    Reason for Consult:  PD catheter removal  Requesting Physician: Orlan Leavens MRN #:  [id number]  History of Present Illness: This is a 28 y.o. female with ESRD on peritoneal dialysis who presented with abdominal pain found to have septic shock related to PD catheter peritonitis and MSSA bacteremia. Initially she was in ICU on pressors but has progressed well and is now on the floor. She is on IV Cefazolin. She had her PD catheter placed at [facility] by Dr. Buzzy Han in January. She does have functioning right upper arm AV fistula as well that was created in 2023 by Dr. Arbie Cookey.  Both Nephrology and ID have recommended that patient have PD catheter removed. Vascular surgery was consulted from removal of PD catheter.   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 10/21/2021   Diabetic autonomic neuropathy ([name])    Diabetic peripheral neuropathy ([name])    Environmental allergies    Goiter    History of blood transfusion    Hypertension    no meds currently   Hypoglycemia associated with diabetes ([name])    Musculoskeletal pain 04/20/2022   Tachycardia    Thyroiditis, autoimmune    Type 1 diabetes mellitus in patient age 68-19 years with HbA1C goal below 7.5     Past Surgical History:  Procedure Laterality Date   AV FISTULA PLACEMENT Right 12/08/2021   Procedure: ARTERIOVENOUS  FISTULA CREATION VERSUS GRAFT;  Surgeon: Larina Earthly, MD;  Location: [facility];  Service: Vascular;  Laterality: Right;   BASCILIC VEIN TRANSPOSITION Right 01/12/2022   Procedure: RIGHT ARM SECOND STAGE BASILIC VEIN TRANSPOSITION;  Surgeon: Larina Earthly, MD;  Location: [facility];  Service: Vascular;  Laterality: Right;   BIOPSY  08/27/2016   Procedure: BIOPSY;   Surgeon: West Bali, MD;  Location: [facility];  Service: Endoscopy;;  duodenum; gastric   BIOPSY  06/27/2019   Procedure: BIOPSY;  Surgeon: Corbin Ade, MD;  Location: [facility];  Service: Endoscopy;;   BIOPSY  02/09/2022   Procedure: BIOPSY;  Surgeon: Lanelle Bal, DO;  Location: [facility];  Service: Endoscopy;;   BREAST CYST EXCISION Right 11/12/2021   Procedure: RIGHT BREAST ABSCESS INCISION AND DRAINAGE;  Surgeon: Emelia Loron, MD;  [facility] OR;  Service: General;  Laterality: Right;  LMA   COLONOSCOPY     COLONOSCOPY WITH PROPOFOL N/A 02/09/2022   Procedure: COLONOSCOPY WITH PROPOFOL;  Surgeon: Lanelle Bal, DO;  Location: [facility];  Service: Endoscopy;  Laterality: N/A;  7:30am, asa 3, dialysis pt   ESOPHAGOGASTRODUODENOSCOPY N/A 08/27/2016   Dr. Darrick Penna: mild gastritis. Negative celiac. No obvious source for dyspepsia/diarrhea   ESOPHAGOGASTRODUODENOSCOPY (EGD) WITH PROPOFOL N/A 06/27/2019   rourk: Focal abnormality of the gastric mucosa likely due to trauma (heaving).  Biopsy showed mild gastritis, negative for H. pylori.  Esophageal dilation for history of dysphagia but normal-appearing esophagus.   ESOPHAGOGASTRODUODENOSCOPY (EGD) WITH PROPOFOL N/A 02/09/2022   Procedure: ESOPHAGOGASTRODUODENOSCOPY (EGD) WITH PROPOFOL;  Surgeon: Lanelle Bal, DO;  Location: [facility];  Service: Endoscopy;  Laterality: N/A;   IR FLUORO GUIDE CV LINE RIGHT  10/13/2021   IR FLUORO GUIDE CV LINE RIGHT  10/19/2021   IR US GUIDE VASC  ACCESS RIGHT  10/13/2021   IRRIGATION AND DEBRIDEMENT ABSCESS N/A 10/04/2021   Procedure: IRRIGATION AND DEBRIDEMENT NECK ABSCESS;  Surgeon: Axel Filler, MD;  Location: [facility] OR;  Service: General;  Laterality: N/A;   TRANSESOPHAGEAL ECHOCARDIOGRAM (CATH LAB) N/A 07/08/2023   Procedure: TRANSESOPHAGEAL ECHOCARDIOGRAM;  Surgeon: Parke Poisson, MD;  Location: [facility] INVASIVE CV LAB;  Service: Cardiovascular;  Laterality: N/A;    No  Known Allergies  Prior to Admission medications   Medication Sig Start Date End Date Taking? Authorizing Provider  B Complex-C-Folic Acid (DIALYVITE 800) 0.8 MG TABS Take 1 tablet by mouth daily. 11/16/21  Yes [provider]  calcitRIOL (ROCALTROL) 0.5 MCG capsule Take 1 mcg by mouth daily.   Yes [provider]  cinacalcet (SENSIPAR) 30 MG tablet Take 30 mg by mouth. 06/10/23  Yes [provider]  gentamicin cream (GARAMYCIN) 0.1 % Apply 1 Application topically daily.   Yes [provider]  Insulin Aspart, w/Niacinamide, (FIASP) 100 UNIT/ML SOLN Inject 5-8 Units as directed with breakfast, with lunch, and with evening meal. Sliding Scale   Yes [provider]  medroxyPROGESTERone (PROVERA) 10 MG tablet Take provera 10 mg for 10 days every 3 months 11/16/22  Yes Cyril Mourning A, NP  ondansetron (ZOFRAN-ODT) 4 MG disintegrating tablet Take 1 tablet (4 mg total) by mouth every 8 (eight) hours as needed for nausea or vomiting. 06/08/23  Yes Pollyann Savoy, MD  pantoprazole (PROTONIX) 40 MG tablet Take 1 tablet (40 mg total) by mouth 2 (two) times daily before a meal. 03/22/23 03/16/24 Yes Mahon, Courtney L, NP  potassium chloride SA (KLOR-CON M) 20 MEQ tablet Take 20 mEq by mouth 2 (two) times daily. 06/23/23  Yes [provider]  rosuvastatin (CRESTOR) 10 MG tablet Take 1 tablet (10 mg total) by mouth daily. 03/15/23  Yes Cook, Jayce G, DO  scopolamine (TRANSDERM-SCOP) 1 MG/3DAYS Place 1 patch (1.5 mg total) onto the skin every 3 (three) days. 02/02/23  Yes Roemhildt, Lorin T, PA-C  sucroferric oxyhydroxide (VELPHORO) 500 MG chewable tablet Chew 1,000 mg by mouth 3 (three) times daily with meals. 11/16/21  Yes [provider]  TRESIBA FLEXTOUCH 100 UNIT/ML FlexTouch Pen Inject 15 Units into the skin at bedtime. 06/23/23  Yes Reardon, Freddi Starr, NP  VENTOLIN HFA 108 (90 Base) MCG/ACT inhaler Inhale 2 puffs into the lungs every 4 (four) hours  as needed for wheezing or shortness of breath. 2 puffs once every 4 hours as needed for cough, wheeze, shortness of breath and chest tightness. May use 2 puffs 5 to 15 minutes before activity to decrease cough or wheeze. 03/24/23  Yes Padgett, Pilar Grammes, MD  Vitamin D, Ergocalciferol, (DRISDOL) 1.25 MG (50000 UNIT) CAPS capsule Take 50,000 Units by mouth every 7 (seven) days.   Yes [provider]  acetaminophen (TYLENOL) 325 MG tablet Take 325 mg by mouth every 6 (six) hours as needed for mild pain (pain score 1-3) or moderate pain (pain score 4-6). Patient not taking: Reported on 07/04/2023 12/24/21   [provider]  cetirizine HCl (ZYRTEC) 5 MG/5ML SOLN TAKE 1.25 ML TWICE A DAY AS NEEDED FOR A RUNNY NOSE OR ITCH Patient not taking: Reported on 07/04/2023 05/09/23   Marcelyn Bruins, MD  Continuous Glucose Receiver (DEXCOM G7 RECEIVER) DEVI by Does not apply route.    [provider]  Continuous Glucose Sensor (DEXCOM G7 SENSOR) MISC by Does not apply route.    [provider]  fluticasone Aleda Grana)  50 MCG/ACT nasal spray Place 2 sprays into both nostrils daily as needed for allergies or rhinitis. Patient not taking: Reported on 07/04/2023 12/23/22   Hetty Blend, FNP  SSD 1 % cream APPLY CREAM TO WOUND DAILY AND COVER WITH GAUZE DRESSING 04/01/23   McCaughan, Dia D, DPM    Social History   Socioeconomic History   Marital status: Single    Spouse name: Not on file   Number of children: 0   Years of education: Not on file   Highest education level: High school graduate  Occupational History   Occupation: unemployed  Tobacco Use   Smoking status: Never    Passive exposure: Never   Smokeless tobacco: Never  Vaping Use   Vaping status: Never Used  Substance and Sexual Activity   Alcohol use: No   Drug use: No   Sexual activity: Not Currently    Birth control/protection: Abstinence, None  Other Topics Concern   Not on file  Social  History Narrative   Not on file   Social Drivers of Health   Financial Resource Strain: Low Risk  (04/28/2023)   [facility]   Overall Financial Resource Strain (CARDIA)    Difficulty of Paying Living Expenses: Not hard at all  Food Insecurity: Patient Declined (07/05/2023)   Hunger Vital Sign    Worried About Running Out of Food in the Last Year: Patient declined    Ran Out of Food in the Last Year: Patient declined  Transportation Needs: Patient Declined (07/05/2023)   PRAPARE - Administrator, Civil Service (Medical): Patient declined    Lack of Transportation (Non-Medical): Patient declined  Physical Activity: Insufficiently Active (01/14/2023)   Exercise Vital Sign    Days of Exercise per Week: 4 days    Minutes of Exercise per Session: 30 min  Stress: No Stress Concern Present (05/24/2023)   Received from [facility] of Occupational [facility] - Occupational Stress Questionnaire    Feeling of Stress : Only a little  Social Connections: Moderately Isolated (01/14/2023)   Social Connection and Isolation Panel [NHANES]    Frequency of Communication with Friends and Family: More than three times a week    Frequency of Social Gatherings with Friends and Family: More than three times a week    Attends Religious Services: More than 4 times per year    Active Member of [employer] or Organizations: No    Attends Banker Meetings: Never    Marital Status: Never married  Intimate Partner Violence: Patient Declined (07/05/2023)   Humiliation, Afraid, Rape, and Kick questionnaire    Fear of Current or Ex-Partner: Patient declined    Emotionally Abused: Patient declined    Physically Abused: Patient declined    Sexually Abused: Patient declined    Family History  Problem Relation Age of Onset   Alcohol abuse Paternal Grandfather    Huntington's disease Maternal Grandfather    Cancer Father        pancreatic   Diabetes Mother         Type II DM   Colon cancer Neg Hx    Colon polyps Neg Hx     ROS: Otherwise negative unless mentioned in HPI  Physical Examination  Vitals:   07/11/23 0824 07/11/23 1217  BP: 96/65 104/72  Pulse:  (!) 111  Resp: 17 15  Temp: 97.8 F (36.6 C) 98.1 F (36.7 C)  SpO2: 99% 99%   Body mass  index is 21.62 kg/m.  General:  WDWN in NAD Gait: Not observed HENT: WNL, normocephalic Pulmonary: normal non-labored breathing Cardiac: regular Abdomen: soft, unable to examine as patient was sitting on commode  Vascular Exam/Pulses: 2+ right radial pulse, right AV fistula with good thrill Extremities: without ischemic changes, without Gangrene , without cellulitis; without open wounds;  Musculoskeletal: no muscle wasting or atrophy  Neurologic: A&O X 3;  No focal weakness or paresthesias are detected; speech is fluent/normal Psychiatric:  The pt has Normal affect.  CBC    Component Value Date/Time   WBC 13.4 (H) 07/11/2023 0443   RBC 3.32 (L) 07/11/2023 0443   HGB 9.6 (L) 07/11/2023 0443   HGB 11.8 09/17/2022 1559   HCT 28.7 (L) 07/11/2023 0443   HCT 37.1 09/17/2022 1559   PLT 148 (L) 07/11/2023 0443   PLT 69 (LL) 09/17/2022 1559   MCV 86.4 07/11/2023 0443   MCV 85 09/17/2022 1559   MCH 28.9 07/11/2023 0443   MCHC 33.4 07/11/2023 0443   RDW 19.9 (H) 07/11/2023 0443   RDW 16.0 (H) 09/17/2022 1559   LYMPHSABS 1.4 07/08/2023 0608   MONOABS 0.4 07/08/2023 0608   EOSABS 0.4 07/08/2023 0608   BASOSABS 0.1 07/08/2023 0608    BMET    Component Value Date/Time   NA 134 (L) 07/11/2023 0443   NA 132 (L) 09/24/2021 1453   K 3.2 (L) 07/11/2023 0443   CL 103 07/11/2023 0443   CO2 22 07/11/2023 0443   GLUCOSE 197 (H) 07/11/2023 0443   BUN 24 (H) 07/11/2023 0443   BUN 36 (H) 09/24/2021 1453   CREATININE 7.03 (H) 07/11/2023 0443   CREATININE 0.98 08/29/2019 1637   CALCIUM 6.9 (L) 07/11/2023 0443   GFRNONAA 8 (L) 07/11/2023 0443   GFRAA >60 10/27/2019 2021    COAGS: Lab Results   Component Value Date   INR 1.4 (H) 07/04/2023   INR 1.4 (H) 10/03/2021   INR 1.2 10/01/2021     Non-Invasive Vascular Imaging:   none  Statin:  Yes.   Beta Blocker:  No. Aspirin:  No. ACEI:  No. ARB:  No. CCB use:  No Other antiplatelets/anticoagulants:  No.    ASSESSMENT/PLAN: This is a 29 y.o. female with ESRD admitted with MSSA Bacteremia and PD catheter peritonitis. She is now indicated for removal of her PD catheter. She has been on IV antibiotics. She has a functioning right upper arm AV fistula so she can transition to HD. Both Nephrology and ID have recommended removal of her PD catheter. Due to very busy OR today and fact that patient is not NPO will plan for removal tomorrow. Discussed the removal with patient and answered her questions. She is agreeable to proceed. Plan will be for PD catheter removal tomorrow in OR with Dr. Karin Lieu.  She will need to be NPO after midnight. Consent order placed. The on call vascular surgeon, Dr. Karin Lieu will see her later this afternoon to provide further management plans  Graceann Congress PA-C [facility] [phone] 07/11/2023  2:09 PM  VASCULAR STAFF ADDENDUM: I have independently interviewed and examined the patient. I agree with the above.  Plan for PD removal tomorrow.  Please make NPO midnight   Victorino Sparrow MD [facility] Phone Number: [phone] 07/11/2023 7:47 PM

## 2023-07-11 NOTE — Progress Notes (Signed)
 PROGRESS NOTE        PATIENT DETAILS Name: Lindsey Murillo Age: 28 y.o. Sex: female Date of Birth: 05/02/1996 Admit Date: 07/04/2023 Admitting Physician Madaline Brilliant, DO [id number], Verdis Frederickson, DO  Brief Summary: Patient is a 28 y.o.  female with history of ESRD on peritoneal dialysis, DM-1, HTN-who presented with abdominal pain-she was found to have septic shock in the setting of PD catheter associated peritonitis and MSSA bacteremia.  She was initially admitted to the ICU-required pressors-evaluated by ID/nephrology-placed on appropriate antimicrobial therapy-stabilized and subsequently transferred to [facility].  See below for further details.  Significant events: 2/25>> septic shock-PD catheter associated peritonitis/MSSA bacteremia-on Levophed 2/26 >> off Levophed 2/27>> transferred to [facility].  Significant studies: 2/24>> CXR: Strandy/patchy opacities left lower lobe-atelectasis versus pneumonia 2/24>> CT abdomen pelvis:  Fluid in the abdomen/pelvis, PD catheter in place 2/26>> echo: EF 50-55%, grade 2 diastolic dysfunction. 2/28>> TEE: No endocarditis.  Significant microbiology data: 2/24>> COVID/influenza/RSV PCR: Negative 2/24>> peritoneal fluid culture: MSSA 2/24>> blood culture: MSSA 2/25>> peritoneal fluid culture: MSSA 2/27>> blood culture: Negative 3/02>> GI pathogen panel: Negative 3/03>> peritoneal fluid culture: Pending.  Procedures: None  Consults: PCCM Nephrology ID Opthalmology  Subjective: Continues to have diarrhea-abdominal pain is much improved.  Overall slowly improving.  Objective: Vitals: Blood pressure 97/63, pulse 90, temperature 97.6 F (36.4 C), temperature source Axillary, resp. rate 19, height 5\' 8"  (1.727 m), weight 64.5 kg, SpO2 99%.   Exam: Gen Exam:Alert awake-not in any distress HEENT:atraumatic, normocephalic Chest: B/L clear to auscultation anteriorly CVS:S1S2 regular Abdomen:soft non tender, non  distended Extremities:+ edema Neurology: Non focal Skin: no rash  Pertinent Labs/Radiology:    Latest Ref Rng & Units 07/11/2023    4:43 AM 07/10/2023    5:58 AM 07/09/2023    6:02 AM  CBC  WBC 4.0 - 10.5 K/uL 13.4  15.1  14.7   Hemoglobin 12.0 - 15.0 g/dL 9.6  9.5  9.7   Hematocrit 36.0 - 46.0 % 28.7  28.1  28.7   Platelets 150 - 400 K/uL 148  175  196     Lab Results  Component Value Date   NA 134 (L) 07/11/2023   K 3.2 (L) 07/11/2023   CL 103 07/11/2023   CO2 22 07/11/2023      Assessment/Plan: Septic shock secondary to PD catheter related MSSA peritonitis and MSSA bacteremia. Sepsis physiology resolved TEE with no endocarditis Repeat blood cultures on 2/27 negative so far Per ophthalmology-no evidence of endophthalmitis. Unfortunately-cell count and peritoneal fluid on 3/1-has worsened-repeat peritoneal fluid cultures pending-ongoing discussions with nephrology/ID regarding whether we need to remove PD catheter. Remains on IV Ancef Await further recommendations from ID/nephrology.   ESRD on peritoneal dialysis Nephrology following See above regarding repeating peritoneal fluid cultures.  Normocytic anemia Secondary to acute illness and ESRD Follow CBC  Hypokalemia Secondary to GI loss-having some diarrhea-appears to be a chronic issue as well Potassium being supplemented.  Diarrhea Unclear whether this is antibiotic related-some diarrhea is chronic but intermittent-this has been persistent for the past several days GI pathogen panel negative Continue as needed Imodium Cussed with infectious disease MD-stool C. difficile studies ordered   DM-1 (A1c 7.2-2/13) CBGs relatively stable Lantus 5 units+ SSI Follow/optimize.  Recent Labs    07/10/23 1642 07/10/23 2154 07/11/23 0823  GLUCAP 81 170* 123*    HLD Statin  ?  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  Retinal detachment Poor historian Appreciate ophthalmology input on 2/27-resume outpatient follow-up with her primary ophthalmologist.  Pressure Ulcer: Agree with assessment and plan as outlined Pressure Injury 07/05/23 Sacrum Mid Stage 2 -  Partial thickness loss of dermis presenting as a shallow open injury with a red, pink wound bed without slough. 2x0.5 linear mid sacral wound (Active)  07/05/23 0000  Location: Sacrum  Location Orientation: Mid  Staging: Stage 2 -  Partial thickness loss of dermis presenting as a shallow open injury with a red, pink wound bed without slough.  Wound Description (Comments): 2x0.5 linear mid sacral wound  Present on Admission: Yes  Dressing Type None 07/10/23 2035    Underweight: Estimated body mass index is 21.62 kg/m as calculated from the following:   Height as of this encounter: 5\' 8"  (1.727 m).   Weight as of this encounter: 64.5 kg.   Code status:   Code Status: Full Code   DVT Prophylaxis: heparin injection 5,000 Units Start: 07/04/23 2230 SCDs Start: 07/04/23 2229  Family Communication: Mother at bedside   Disposition Plan: Status is: Inpatient Remains inpatient appropriate because: Severity of illness   Planned Discharge Destination:Home health   Diet: Diet Order             Diet Carb Modified Fluid consistency: Thin; Room service appropriate? Yes; Fluid restriction: 1500 mL Fluid  Diet effective now                     Antimicrobial agents: Anti-infectives (From admission, onward)    Start     Dose/Rate Route Frequency Ordered Stop   07/05/23 2000  ceFAZolin (ANCEF) IVPB 1 g/50 mL premix        1 g 100 mL/hr over 30 Minutes Intravenous Every 24 hours 07/05/23 1737     07/05/23 1800  ceFEPIme (MAXIPIME) 1 g in sodium chloride 0.9 % 100 mL IVPB  Status:  Discontinued        1 g 200 mL/hr over 30 Minutes Intravenous Every 24 hours 07/05/23 0423 07/05/23 0914   07/05/23 1800  cefTAZidime (FORTAZ) 1 g in  sodium chloride 0.9 % 100 mL IVPB  Status:  Discontinued        1 g 200 mL/hr over 30 Minutes Intravenous Every 24 hours 07/05/23 0914 07/05/23 1643   07/05/23 0500  ceFEPIme (MAXIPIME) 1 g in sodium chloride 0.9 % 100 mL IVPB  Status:  Discontinued        1 g 200 mL/hr over 30 Minutes Intravenous Every 24 hours 07/05/23 0423 07/05/23 0423   07/05/23 0422  vancomycin variable dose per unstable renal function (pharmacist dosing)  Status:  Discontinued         Does not apply See admin instructions 07/05/23 0423 07/05/23 1643   07/05/23 0345  vancomycin (VANCOCIN) IVPB 1000 mg/200 mL premix  Status:  Discontinued        1,000 mg 200 mL/hr over 60 Minutes Intravenous Every 12 hours 07/05/23 0259 07/05/23 0452   07/05/23 0345  ceFEPIme (MAXIPIME) 500 mg in dextrose 5 % 50 mL IVPB  Status:  Discontinued        500 mg 110 mL/hr over 30 Minutes Intravenous Every 12 hours 07/05/23 0259 07/05/23 0423   07/04/23 1745  ceFEPIme (MAXIPIME) 2 g in sodium chloride 0.9 % 100 mL IVPB  2 g 200 mL/hr over 30 Minutes Intravenous  Once 07/04/23 1739 07/04/23 1835   07/04/23 1745  vancomycin (VANCOREADY) IVPB 1500 mg/300 mL        1,500 mg 150 mL/hr over 120 Minutes Intravenous  Once 07/04/23 1743 07/04/23 2209        MEDICATIONS: Scheduled Meds:  calcitRIOL  1 mcg Oral Daily   Chlorhexidine Gluconate Cloth  6 each Topical Daily   [START ON 07/13/2023] darbepoetin (ARANESP) injection - NON-DIALYSIS  100 mcg Subcutaneous Q Wed-1800   famotidine  10 mg Oral Daily   feeding supplement (NEPRO CARB STEADY)  237 mL Oral BID BM   gentamicin cream  1 Application Topical Daily   gentamicin cream  1 Application Topical Daily   heparin  5,000 Units Subcutaneous Q8H   insulin aspart  0-6 Units Subcutaneous TID WC   insulin glargine  5 Units Subcutaneous QHS   midodrine  5 mg Oral TID WC   multivitamin  1 tablet Oral Daily   potassium chloride  40 mEq Oral BID   rosuvastatin  10 mg Oral Daily   sodium  chloride flush  3-10 mL Intravenous Q12H   Continuous Infusions:   ceFAZolin (ANCEF) IV Stopped (07/10/23 2135)   dialysis solution 1.5% low-MG/low-CA     dialysis solution 1.5% low-MG/low-CA     PRN Meds:.acetaminophen, docusate sodium, HYDROmorphone (DILAUDID) injection, loperamide, ondansetron (ZOFRAN) IV, mouth rinse, polyethylene glycol, polyvinyl alcohol, prochlorperazine, sodium chloride flush   I have personally reviewed following labs and imaging studies  LABORATORY DATA: CBC: Recent Labs  Lab 07/04/23 1542 07/05/23 0113 07/07/23 0520 07/08/23 0608 07/09/23 0602 07/10/23 0558 07/11/23 0443  WBC 10.0   < > 13.5* 13.8* 14.7* 15.1* 13.4*  NEUTROABS 9.0*  --   --  11.4*  --   --   --   HGB 10.5*   < > 10.0* 10.5* 9.7* 9.5* 9.6*  HCT 32.1*   < > 29.9* 30.7* 28.7* 28.1* 28.7*  MCV 89.7   < > 87.2 86.5 87.8 86.5 86.4  PLT 244   < > 205 211 196 175 148*   < > = values in this interval not displayed.    Basic Metabolic Panel: Recent Labs  Lab 07/05/23 0124 07/05/23 0754 07/05/23 1248 07/06/23 0325 07/07/23 0520 07/08/23 0608 07/09/23 0602 07/10/23 0558 07/11/23 0443  NA  --  134*  --  132*  134* 131* 134* 133* 135 134*  K  --  2.5*   < > 3.5  3.6 3.3* 3.2* 3.0* 3.0* 3.2*  CL  --  105  --  100  101 100 101 100 99 103  CO2  --  18*  --  18*  20* 17* 19* 20* 21* 22  GLUCOSE  --  98  --  208*  209* 201* 90 202* 183* 197*  BUN  --  38*  --  35*  34* 33* 29* 27* 24* 24*  CREATININE  --  10.37*  --  8.88*  8.86* 7.91* 7.68* 7.37* 7.35* 7.03*  CALCIUM  --  7.2*  --  7.5*  7.5* 7.2* 7.1* 6.9* 7.2* 6.9*  MG 2.0 1.9  --  1.9 1.9  --   --   --  1.7  PHOS 4.2  --   --  3.6 3.3 3.0 2.9 2.5 2.4*   < > = values in this interval not displayed.    GFR: Estimated Creatinine Clearance: 12 mL/min (A) (by C-G formula based on SCr  of 7.03 mg/dL (H)).  Liver Function Tests: Recent Labs  Lab 07/04/23 1542 07/05/23 0113 07/07/23 0520 07/08/23 1610 07/09/23 0602  07/10/23 0558 07/11/23 0443  AST 10*  --   --   --   --   --   --   ALT 10  --   --   --   --   --   --   ALKPHOS 84  --   --   --   --   --   --   BILITOT 0.6  --   --   --   --   --   --   PROT 5.4*  --   --   --   --   --   --   ALBUMIN 1.7*   < > <1.5* <1.5* <1.5* <1.5* <1.5*   < > = values in this interval not displayed.   No results for input(s): "LIPASE", "AMYLASE" in the last 168 hours. No results for input(s): "AMMONIA" in the last 168 hours.  Coagulation Profile: Recent Labs  Lab 07/04/23 1542  INR 1.4*    Cardiac Enzymes: No results for input(s): "CKTOTAL", "CKMB", "CKMBINDEX", "TROPONINI" in the last 168 hours.  BNP (last 3 results) No results for input(s): "PROBNP" in the last 8760 hours.  Lipid Profile: No results for input(s): "CHOL", "HDL", "LDLCALC", "TRIG", "CHOLHDL", "LDLDIRECT" in the last 72 hours.  Thyroid Function Tests: No results for input(s): "TSH", "T4TOTAL", "FREET4", "T3FREE", "THYROIDAB" in the last 72 hours.  Anemia Panel: No results for input(s): "VITAMINB12", "FOLATE", "FERRITIN", "TIBC", "IRON", "RETICCTPCT" in the last 72 hours.  Urine analysis:    Component Value Date/Time   COLORURINE YELLOW 10/07/2021 1325   APPEARANCEUR TURBID (A) 10/07/2021 1325   LABSPEC 1.011 10/07/2021 1325   PHURINE 5.0 10/07/2021 1325   GLUCOSEU 50 (A) 10/07/2021 1325   HGBUR SMALL (A) 10/07/2021 1325   BILIRUBINUR NEGATIVE 10/07/2021 1325   KETONESUR NEGATIVE 10/07/2021 1325   PROTEINUR 100 (A) 10/07/2021 1325   UROBILINOGEN 0.2 01/07/2015 1723   NITRITE NEGATIVE 10/07/2021 1325   LEUKOCYTESUR LARGE (A) 10/07/2021 1325    Sepsis Labs: Lactic Acid, Venous    Component Value Date/Time   LATICACIDVEN 0.7 07/04/2023 2109    MICROBIOLOGY: Recent Results (from the past 240 hours)  Culture, blood (Routine x 2)     Status: Abnormal   Collection Time: 07/04/23  3:42 PM   Specimen: BLOOD  Result Value Ref Range Status   Specimen Description BLOOD  SITE NOT SPECIFIED  Final   Special Requests   Final    BOTTLES DRAWN AEROBIC AND ANAEROBIC Blood Culture results may not be optimal due to an inadequate volume of blood received in culture bottles   Culture  Setup Time   Final    GRAM POSITIVE COCCI IN CLUSTERS AEROBIC BOTTLE ONLY CRITICAL RESULT CALLED TO, READ BACK BY AND VERIFIED WITH: PHARMD ADaphane Shepherd [id number] @ 1604 FH Performed at [facility], [address], [place], [place] [address]    Culture STAPHYLOCOCCUS AUREUS (A)  Final   Report Status 07/07/2023 FINAL  Final   Organism ID, Bacteria STAPHYLOCOCCUS AUREUS  Final      Susceptibility   Staphylococcus aureus - MIC*    CIPROFLOXACIN <=0.5 SENSITIVE Sensitive     ERYTHROMYCIN <=0.25 SENSITIVE Sensitive     GENTAMICIN <=0.5 SENSITIVE Sensitive     OXACILLIN <=0.25 SENSITIVE Sensitive     TETRACYCLINE <=1 SENSITIVE Sensitive     VANCOMYCIN  1 SENSITIVE Sensitive     TRIMETH/SULFA <=10 SENSITIVE Sensitive     CLINDAMYCIN <=0.25 SENSITIVE Sensitive     RIFAMPIN <=0.5 SENSITIVE Sensitive     Inducible Clindamycin NEGATIVE Sensitive     LINEZOLID 2 SENSITIVE Sensitive     * STAPHYLOCOCCUS AUREUS  Blood Culture ID Panel (Reflexed)     Status: Abnormal   Collection Time: 07/04/23  3:42 PM  Result Value Ref Range Status   Enterococcus faecalis NOT DETECTED NOT DETECTED Final   Enterococcus Faecium NOT DETECTED NOT DETECTED Final   Listeria monocytogenes NOT DETECTED NOT DETECTED Final   Staphylococcus species DETECTED (A) NOT DETECTED Final    Comment: CRITICAL RESULT CALLED TO, READ BACK BY AND VERIFIED WITH: PHARMD A. MEYER [id number] @ 1604 FH    Staphylococcus aureus (BCID) DETECTED (A) NOT DETECTED Final    Comment: CRITICAL RESULT CALLED TO, READ BACK BY AND VERIFIED WITH: PHARMD A. MEYER [id number] @ 1604 FH    Staphylococcus epidermidis NOT DETECTED NOT DETECTED Final   Staphylococcus lugdunensis NOT DETECTED NOT DETECTED Final   Streptococcus species NOT DETECTED NOT  DETECTED Final   Streptococcus agalactiae NOT DETECTED NOT DETECTED Final   Streptococcus pneumoniae NOT DETECTED NOT DETECTED Final   Streptococcus pyogenes NOT DETECTED NOT DETECTED Final   A.calcoaceticus-baumannii NOT DETECTED NOT DETECTED Final   Bacteroides fragilis NOT DETECTED NOT DETECTED Final   Enterobacterales NOT DETECTED NOT DETECTED Final   Enterobacter cloacae complex NOT DETECTED NOT DETECTED Final   Escherichia coli NOT DETECTED NOT DETECTED Final   Klebsiella aerogenes NOT DETECTED NOT DETECTED Final   Klebsiella oxytoca NOT DETECTED NOT DETECTED Final   Klebsiella pneumoniae NOT DETECTED NOT DETECTED Final   Proteus species NOT DETECTED NOT DETECTED Final   Salmonella species NOT DETECTED NOT DETECTED Final   Serratia marcescens NOT DETECTED NOT DETECTED Final   Haemophilus influenzae NOT DETECTED NOT DETECTED Final   Neisseria meningitidis NOT DETECTED NOT DETECTED Final   Pseudomonas aeruginosa NOT DETECTED NOT DETECTED Final   Stenotrophomonas maltophilia NOT DETECTED NOT DETECTED Final   Candida albicans NOT DETECTED NOT DETECTED Final   Candida auris NOT DETECTED NOT DETECTED Final   Candida glabrata NOT DETECTED NOT DETECTED Final   Candida krusei NOT DETECTED NOT DETECTED Final   Candida parapsilosis NOT DETECTED NOT DETECTED Final   Candida tropicalis NOT DETECTED NOT DETECTED Final   Cryptococcus neoformans/gattii NOT DETECTED NOT DETECTED Final   Meth resistant mecA/C and MREJ NOT DETECTED NOT DETECTED Final    Comment: Performed at [facility], [address], [place], [place] [address]  Resp panel by RT-PCR (RSV, Flu A&B, Covid) Anterior Nasal Swab     Status: None   Collection Time: 07/04/23  3:46 PM   Specimen: Anterior Nasal Swab  Result Value Ref Range Status   SARS Coronavirus 2 by RT PCR NEGATIVE NEGATIVE Final   Influenza A by PCR NEGATIVE NEGATIVE Final   Influenza B by PCR NEGATIVE NEGATIVE Final    Comment: (NOTE) The Xpert Xpress  SARS-CoV-2/FLU/RSV plus assay is intended as an aid in the diagnosis of influenza from Nasopharyngeal swab specimens and should not be used as a sole basis for treatment. Nasal washings and aspirates are unacceptable for Xpert Xpress SARS-CoV-2/FLU/RSV testing.  Fact Sheet for Patients: BloggerCourse.com  Fact Sheet for Healthcare Providers: SeriousBroker.it  This test is not yet approved or cleared by the [place] FDA and has been authorized for detection and/or diagnosis of SARS-CoV-2 by  FDA under an Emergency Use Authorization (EUA). This EUA will remain in effect (meaning this test can be used) for the duration of the COVID-19 declaration under Section 564(b)(1) of the Act, 21 U.S.C. section 360bbb-3(b)(1), unless the authorization is terminated or revoked.     Resp Syncytial Virus by PCR NEGATIVE NEGATIVE Final    Comment: (NOTE) Fact Sheet for Patients: BloggerCourse.com  Fact Sheet for Healthcare Providers: SeriousBroker.it  This test is not yet approved or cleared by the [place] FDA and has been authorized for detection and/or diagnosis of SARS-CoV-2 by FDA under an Emergency Use Authorization (EUA). This EUA will remain in effect (meaning this test can be used) for the duration of the COVID-19 declaration under Section 564(b)(1) of the Act, 21 U.S.C. section 360bbb-3(b)(1), unless the authorization is terminated or revoked.  Performed at [facility], [address] [place], [place] [address]   Body fluid culture w Gram Stain     Status: None   Collection Time: 07/04/23  7:34 PM   Specimen: Peritoneal Dialysate; Body Fluid  Result Value Ref Range Status   Specimen Description PERITONEAL DIALYSATE ABDOMEN  Final   Special Requests NONE  Final   Gram Stain   Final    ABUNDANT WBC PRESENT, PREDOMINANTLY PMN NO ORGANISMS SEEN Performed at [facility], [address], [place], [place] [address]    Culture MODERATE STAPHYLOCOCCUS AUREUS  Final   Report Status 07/06/2023 FINAL  Final   Organism ID, Bacteria STAPHYLOCOCCUS AUREUS  Final      Susceptibility   Staphylococcus aureus - MIC*    CIPROFLOXACIN <=0.5 SENSITIVE Sensitive     ERYTHROMYCIN <=0.25 SENSITIVE Sensitive     GENTAMICIN <=0.5 SENSITIVE Sensitive     OXACILLIN <=0.25 SENSITIVE Sensitive     TETRACYCLINE <=1 SENSITIVE Sensitive     VANCOMYCIN 1 SENSITIVE Sensitive     TRIMETH/SULFA <=10 SENSITIVE Sensitive     CLINDAMYCIN <=0.25 SENSITIVE Sensitive     RIFAMPIN <=0.5 SENSITIVE Sensitive     Inducible Clindamycin NEGATIVE Sensitive     LINEZOLID 2 SENSITIVE Sensitive     * MODERATE STAPHYLOCOCCUS AUREUS  Culture, blood (Routine x 2)     Status: None   Collection Time: 07/04/23  9:08 PM   Specimen: BLOOD LEFT ARM  Result Value Ref Range Status   Specimen Description BLOOD LEFT ARM  Final   Special Requests   Final    BOTTLES DRAWN AEROBIC AND ANAEROBIC Blood Culture results may not be optimal due to an inadequate volume of blood received in culture bottles   Culture   Final    NO GROWTH 5 DAYS Performed at [facility], [address], [place], [place] [address]    Report Status 07/09/2023 FINAL  Final  MRSA Next Gen by PCR, Nasal     Status: None   Collection Time: 07/05/23 12:12 AM   Specimen: Nasal Mucosa; Nasal Swab  Result Value Ref Range Status   MRSA by PCR Next Gen NOT DETECTED NOT DETECTED Final    Comment: (NOTE) The GeneXpert MRSA Assay (FDA approved for NASAL specimens only), is one component of a comprehensive MRSA colonization surveillance program. It is not intended to diagnose MRSA infection nor to guide or monitor treatment for MRSA infections. Test performance is not FDA approved in patients less than 24 years old. Performed at [facility], [address], [place], [place] [address]   Body fluid culture w Gram Stain  Status: None   Collection Time: 07/05/23  7:19 AM   Specimen: Peritoneal Washings; Body Fluid  Result Value Ref Range Status   Specimen Description PERITONEAL  Final   Special Requests NONE  Final   Gram Stain   Final    ABUNDANT WBC PRESENT, PREDOMINANTLY PMN RARE GRAM POSITIVE COCCI Performed at [facility], [address], [place], [place] [address]    Culture FEW STAPHYLOCOCCUS AUREUS  Final   Report Status 07/07/2023 FINAL  Final   Organism ID, Bacteria STAPHYLOCOCCUS AUREUS  Final      Susceptibility   Staphylococcus aureus - MIC*    CIPROFLOXACIN <=0.5 SENSITIVE Sensitive     ERYTHROMYCIN <=0.25 SENSITIVE Sensitive     GENTAMICIN <=0.5 SENSITIVE Sensitive     OXACILLIN 0.5 SENSITIVE Sensitive     TETRACYCLINE <=1 SENSITIVE Sensitive     VANCOMYCIN 1 SENSITIVE Sensitive     TRIMETH/SULFA <=10 SENSITIVE Sensitive     CLINDAMYCIN <=0.25 SENSITIVE Sensitive     RIFAMPIN <=0.5 SENSITIVE Sensitive     Inducible Clindamycin NEGATIVE Sensitive     LINEZOLID 2 SENSITIVE Sensitive     * FEW STAPHYLOCOCCUS AUREUS  Culture, blood (Routine X 2) w Reflex to ID Panel     Status: None (Preliminary result)   Collection Time: 07/07/23  5:20 AM   Specimen: BLOOD LEFT HAND  Result Value Ref Range Status   Specimen Description BLOOD LEFT HAND  Final   Special Requests   Final    BOTTLES DRAWN AEROBIC ONLY Blood Culture results may not be optimal due to an inadequate volume of blood received in culture bottles   Culture   Final    NO GROWTH 4 DAYS Performed at [facility], [address], [place], [place] [address]    Report Status PENDING  Incomplete  Culture, blood (Routine X 2) w Reflex to ID Panel     Status: None (Preliminary result)   Collection Time: 07/07/23  5:27 AM   Specimen: BLOOD LEFT ARM  Result Value Ref Range Status   Specimen Description BLOOD LEFT ARM  Final   Special Requests   Final    BOTTLES DRAWN AEROBIC AND ANAEROBIC Blood Culture adequate volume    Culture   Final    NO GROWTH 4 DAYS Performed at [facility], [address], [place], [place] [address]    Report Status PENDING  Incomplete  Gastrointestinal Panel by PCR , Stool     Status: None   Collection Time: 07/10/23 11:22 AM   Specimen: Stool  Result Value Ref Range Status   Campylobacter species NOT DETECTED NOT DETECTED Final   Plesimonas shigelloides NOT DETECTED NOT DETECTED Final   Salmonella species NOT DETECTED NOT DETECTED Final   Yersinia enterocolitica NOT DETECTED NOT DETECTED Final   Vibrio species NOT DETECTED NOT DETECTED Final   Vibrio cholerae NOT DETECTED NOT DETECTED Final   Enteroaggregative E coli (EAEC) NOT DETECTED NOT DETECTED Final   Enteropathogenic E coli (EPEC) NOT DETECTED NOT DETECTED Final   Enterotoxigenic E coli (ETEC) NOT DETECTED NOT DETECTED Final   Shiga like toxin producing E coli (STEC) NOT DETECTED NOT DETECTED Final   Shigella/Enteroinvasive E coli (EIEC) NOT DETECTED NOT DETECTED Final   Cryptosporidium NOT DETECTED NOT DETECTED Final   Cyclospora cayetanensis NOT DETECTED NOT DETECTED Final   Entamoeba histolytica NOT DETECTED NOT DETECTED Final   Giardia lamblia NOT DETECTED NOT DETECTED Final   Adenovirus F40/41 NOT  DETECTED NOT DETECTED Final   Astrovirus NOT DETECTED NOT DETECTED Final   Norovirus GI/GII NOT DETECTED NOT DETECTED Final   Rotavirus A NOT DETECTED NOT DETECTED Final   Sapovirus (I, II, IV, and V) NOT DETECTED NOT DETECTED Final    Comment: Performed at [facility], [address] [place], [place] [address]  Body fluid culture w Gram Stain     Status: None (Preliminary result)   Collection Time: 07/11/23  1:25 AM   Specimen: Peritoneal Dialysate; Body Fluid  Result Value Ref Range Status   Specimen Description PERITONEAL DIALYSATE FLUID  Final   Special Requests NONE  Final   Gram Stain   Final    RARE WBC PRESENT, PREDOMINANTLY PMN NO ORGANISMS SEEN Performed at [facility],  [address], [place], [place] [address]    Culture PENDING  Incomplete   Report Status PENDING  Incomplete    RADIOLOGY STUDIES/RESULTS: No results found.    LOS: 7 days   Jeoffrey Massed, MD  [facility]    To contact the attending provider between 7A-7P or the covering provider during after hours 7P-7A, please log into the web site [email or web address] and access using universal [place] password for that web site. If you do not have the password, please call the hospital operator.  07/11/2023, 12:15 PM

## 2023-07-12 ENCOUNTER — Inpatient Hospital Stay (HOSPITAL_COMMUNITY): Admitting: Anesthesiology

## 2023-07-12 ENCOUNTER — Encounter (HOSPITAL_COMMUNITY)
Admission: EM | Disposition: A | Discharge status: Home / Self Care | Payer: Self-pay | Source: Home / Self Care | Attending: Internal Medicine

## 2023-07-12 ENCOUNTER — Other Ambulatory Visit: Payer: Self-pay

## 2023-07-12 ENCOUNTER — Encounter (HOSPITAL_COMMUNITY): Payer: Self-pay

## 2023-07-12 DIAGNOSIS — N186 End stage renal disease: Secondary | ICD-10-CM | POA: Diagnosis not present

## 2023-07-12 DIAGNOSIS — N2581 Secondary hyperparathyroidism of renal origin: Secondary | ICD-10-CM | POA: Diagnosis not present

## 2023-07-12 DIAGNOSIS — J45909 Unspecified asthma, uncomplicated: Secondary | ICD-10-CM

## 2023-07-12 DIAGNOSIS — T8571XS Infection and inflammatory reaction due to peritoneal dialysis catheter, sequela: Secondary | ICD-10-CM | POA: Diagnosis not present

## 2023-07-12 DIAGNOSIS — I10 Essential (primary) hypertension: Secondary | ICD-10-CM

## 2023-07-12 DIAGNOSIS — K659 Peritonitis, unspecified: Secondary | ICD-10-CM | POA: Diagnosis not present

## 2023-07-12 DIAGNOSIS — T8571XA Infection and inflammatory reaction due to peritoneal dialysis catheter, initial encounter: Secondary | ICD-10-CM | POA: Diagnosis not present

## 2023-07-12 DIAGNOSIS — A419 Sepsis, unspecified organism: Secondary | ICD-10-CM | POA: Diagnosis not present

## 2023-07-12 DIAGNOSIS — R6521 Severe sepsis with septic shock: Secondary | ICD-10-CM | POA: Diagnosis not present

## 2023-07-12 DIAGNOSIS — Z992 Dependence on renal dialysis: Secondary | ICD-10-CM | POA: Diagnosis not present

## 2023-07-12 DIAGNOSIS — N185 Chronic kidney disease, stage 5: Secondary | ICD-10-CM | POA: Diagnosis not present

## 2023-07-12 DIAGNOSIS — B9561 Methicillin susceptible Staphylococcus aureus infection as the cause of diseases classified elsewhere: Secondary | ICD-10-CM | POA: Diagnosis not present

## 2023-07-12 HISTORY — PX: CAPD REMOVAL: SHX5234

## 2023-07-12 LAB — GLUCOSE, CAPILLARY
Glucose-Capillary: 81 mg/dL (ref 70–99)
Glucose-Capillary: 96 mg/dL (ref 70–99)
Glucose-Capillary: 81 mg/dL (ref 70–99)
Glucose-Capillary: 73 mg/dL (ref 70–99)
Glucose-Capillary: 102 mg/dL — ABNORMAL HIGH (ref 70–99)
Glucose-Capillary: 115 mg/dL — ABNORMAL HIGH (ref 70–99)

## 2023-07-12 LAB — RENAL FUNCTION PANEL
Albumin: <1.5 g/dL — ABNORMAL LOW (ref 3.5–5.0)
Anion gap: 12 (ref 5–15)
BUN: 26 mg/dL — ABNORMAL HIGH (ref 6–20)
CO2: 19 mmol/L — ABNORMAL LOW (ref 22–32)
Calcium: 7.3 mg/dL — ABNORMAL LOW (ref 8.9–10.3)
Chloride: 106 mmol/L (ref 98–111)
Creatinine, Ser: 7.92 mg/dL — ABNORMAL HIGH (ref 0.44–1.00)
GFR, Estimated: 7 mL/min — ABNORMAL LOW (ref >60)
Glucose, Bld: 75 mg/dL (ref 70–99)
Phosphorus: 3.1 mg/dL (ref 2.5–4.6)
Potassium: 4 mmol/L (ref 3.5–5.1)
Sodium: 137 mmol/L (ref 135–145)

## 2023-07-12 LAB — CULTURE, BLOOD (ROUTINE X 2)
Culture: NO GROWTH
Culture: NO GROWTH
Special Requests: ADEQUATE

## 2023-07-12 LAB — HEPATITIS B SURFACE ANTIGEN: Hepatitis B Surface Ag: NONREACTIVE

## 2023-07-12 SURGERY — CONTINUOUS AMBULATORY PERITONEAL DIALYSIS  (CAPD) CATHETER REMOVAL
Anesthesia: General

## 2023-07-12 MED ORDER — CHLORHEXIDINE GLUCONATE 0.12 % MT SOLN
OROMUCOSAL | Status: AC
Start: 2023-07-12 — End: 2023-07-12
  Administered 2023-07-12: 15 mL via OROMUCOSAL
  Filled 2023-07-12: qty 15

## 2023-07-12 MED ORDER — PROPOFOL 10 MG/ML IV BOLUS
INTRAVENOUS | Status: DC | PRN
  Administered 2023-07-12: 120 mg via INTRAVENOUS

## 2023-07-12 MED ORDER — LIDOCAINE HCL (PF) 2 % IJ SOLN
INTRAMUSCULAR | Status: DC | PRN
  Administered 2023-07-12: 100 mg via INTRADERMAL

## 2023-07-12 MED ORDER — ONDANSETRON HCL 4 MG/2ML IJ SOLN
INTRAMUSCULAR | Status: AC
Start: 2023-07-12 — End: ?
  Filled 2023-07-12: qty 2

## 2023-07-12 MED ORDER — ORAL CARE MOUTH RINSE: 15.0000 mL | Freq: Once | OROMUCOSAL | Status: AC

## 2023-07-12 MED ORDER — CALCIUM CHLORIDE 10 % IV SOLN
INTRAVENOUS | Status: DC | PRN
Start: 2023-07-12 — End: 2023-07-12
  Administered 2023-07-12: 300 mg via INTRAVENOUS
  Administered 2023-07-12: 400 mg via INTRAVENOUS
  Administered 2023-07-12: 100 mg via INTRAVENOUS
  Administered 2023-07-12: 200 mg via INTRAVENOUS

## 2023-07-12 MED ORDER — SUGAMMADEX SODIUM 200 MG/2ML IV SOLN
INTRAVENOUS | Status: AC
  Filled 2023-07-12: qty 2

## 2023-07-12 MED ORDER — MIDAZOLAM HCL 2 MG/2ML IJ SOLN
INTRAMUSCULAR | Status: AC
  Filled 2023-07-12: qty 2

## 2023-07-12 MED ORDER — LIDOCAINE 2% (20 MG/ML) 5 ML SYRINGE
INTRAMUSCULAR | Status: AC
Start: 2023-07-12 — End: ?
  Filled 2023-07-12: qty 5

## 2023-07-12 MED ORDER — SODIUM CHLORIDE 0.9 % IV SOLN: INTRAVENOUS | Status: DC

## 2023-07-12 MED ORDER — ALBUMIN HUMAN 5 % IV SOLN: INTRAVENOUS | Status: DC | PRN

## 2023-07-12 MED ORDER — FENTANYL CITRATE (PF) 250 MCG/5ML IJ SOLN
INTRAMUSCULAR | Status: AC
  Filled 2023-07-12: qty 5

## 2023-07-12 MED ORDER — CHLORHEXIDINE GLUCONATE 0.12 % MT SOLN: 15.0000 mL | Freq: Once | OROMUCOSAL | Status: AC

## 2023-07-12 MED ORDER — FENTANYL CITRATE (PF) 250 MCG/5ML IJ SOLN
INTRAMUSCULAR | Status: DC | PRN
  Administered 2023-07-12 (×2): 50 ug via INTRAVENOUS

## 2023-07-12 MED ORDER — CALCIUM CHLORIDE 10 % IV SOLN
INTRAVENOUS | Status: AC
  Filled 2023-07-12: qty 10

## 2023-07-12 MED ORDER — PROPOFOL 10 MG/ML IV BOLUS
INTRAVENOUS | Status: AC
  Filled 2023-07-12: qty 20

## 2023-07-12 MED ORDER — ROCURONIUM BROMIDE 100 MG/10ML IV SOLN
INTRAVENOUS | Status: DC | PRN
  Administered 2023-07-12: 50 mg via INTRAVENOUS

## 2023-07-12 MED ORDER — SUGAMMADEX SODIUM 200 MG/2ML IV SOLN
INTRAVENOUS | Status: DC | PRN
  Administered 2023-07-12: 128.6 mg via INTRAVENOUS

## 2023-07-12 MED ORDER — 0.9 % SODIUM CHLORIDE (POUR BTL) OPTIME
TOPICAL | Status: DC | PRN
  Administered 2023-07-12: 1000 mL

## 2023-07-12 MED ORDER — CEFAZOLIN SODIUM-DEXTROSE 1-4 GM/50ML-% IV SOLN
INTRAVENOUS | Status: DC | PRN
  Administered 2023-07-12: 1 g via INTRAVENOUS

## 2023-07-12 MED ORDER — ONDANSETRON HCL 4 MG/2ML IJ SOLN
INTRAMUSCULAR | Status: DC | PRN
  Administered 2023-07-12: 4 mg via INTRAVENOUS

## 2023-07-12 SURGICAL SUPPLY — 23 items
BENZOIN TINCTURE PRP APPL 2/3 (GAUZE/BANDAGES/DRESSINGS) ×1 IMPLANT
BLADE CLIPPER SURG (BLADE) IMPLANT
CHLORAPREP W/TINT 26 (MISCELLANEOUS) ×1 IMPLANT
COVER SURGICAL LIGHT HANDLE (MISCELLANEOUS) ×1 IMPLANT
DERMABOND ADVANCED .7 DNX12 (GAUZE/BANDAGES/DRESSINGS) IMPLANT
DRSG IV TEGADERM 3.5X4.5 STRL (GAUZE/BANDAGES/DRESSINGS) ×1 IMPLANT
DRSG TEGADERM 4X10 (GAUZE/BANDAGES/DRESSINGS) IMPLANT
ELECT REM PT RETURN 9FT ADLT (ELECTROSURGICAL) ×1 IMPLANT
ELECTRODE REM PT RTRN 9FT ADLT (ELECTROSURGICAL) ×1 IMPLANT
GLOVE BIO SURGEON STRL SZ8 (GLOVE) ×1 IMPLANT
GOWN STRL REUS W/ TWL XL LVL3 (GOWN DISPOSABLE) ×1 IMPLANT
KIT BASIN OR (CUSTOM PROCEDURE TRAY) ×1 IMPLANT
KIT TURNOVER KIT B (KITS) ×1 IMPLANT
NS IRRIG 1000ML POUR BTL (IV SOLUTION) ×1 IMPLANT
PACK GENERAL/GYN (CUSTOM PROCEDURE TRAY) ×1 IMPLANT
PAD ARMBOARD 7.5X6 YLW CONV (MISCELLANEOUS) ×2 IMPLANT
SPONGE GAUZE 2X2 8PLY STRL LF (GAUZE/BANDAGES/DRESSINGS) ×1 IMPLANT
STRIP CLOSURE SKIN 1/2X4 (GAUZE/BANDAGES/DRESSINGS) ×2 IMPLANT
SUT MNCRL AB 4-0 PS2 18 (SUTURE) ×2 IMPLANT
SUT VIC AB 3-0 SH 27X BRD (SUTURE) ×1 IMPLANT
TOWEL GREEN STERILE (TOWEL DISPOSABLE) ×1 IMPLANT
TUBE CONNECTING 12X1/4 (SUCTIONS) ×1 IMPLANT
WATER STERILE IRR 1000ML POUR (IV SOLUTION) ×1 IMPLANT

## 2023-07-12 NOTE — Progress Notes (Addendum)
  Progress Note    07/12/2023 6:53 AM Hospital Day 8  Subjective:  pt off floor for surgery  afebrile  Vitals:   07/11/23 2305 07/12/23 0513  BP: 107/67 133/85  Pulse: 97 (!) 101  Resp: 18 16  Temp: 97.6 F (36.4 C) 97.7 F (36.5 C)  SpO2: 98% 99%    CBC    Component Value Date/Time   WBC 13.4 (H) 07/11/2023 0443   RBC 3.32 (L) 07/11/2023 0443   HGB 9.6 (L) 07/11/2023 0443   HGB 11.8 09/17/2022 1559   HCT 28.7 (L) 07/11/2023 0443   HCT 37.1 09/17/2022 1559   PLT 148 (L) 07/11/2023 0443   PLT 69 (LL) 09/17/2022 1559   MCV 86.4 07/11/2023 0443   MCV 85 09/17/2022 1559   MCH 28.9 07/11/2023 0443   MCHC 33.4 07/11/2023 0443   RDW 19.9 (H) 07/11/2023 0443   RDW 16.0 (H) 09/17/2022 1559   LYMPHSABS 1.4 07/08/2023 0608   MONOABS 0.4 07/08/2023 0608   EOSABS 0.4 07/08/2023 0608   BASOSABS 0.1 07/08/2023 0608    BMET    Component Value Date/Time   NA 137 07/12/2023 0503   NA 132 (L) 09/24/2021 1453   K 4.0 07/12/2023 0503   CL 106 07/12/2023 0503   CO2 19 (L) 07/12/2023 0503   GLUCOSE 75 07/12/2023 0503   BUN 26 (H) 07/12/2023 0503   BUN 36 (H) 09/24/2021 1453   CREATININE 7.92 (H) 07/12/2023 0503   CREATININE 0.98 08/29/2019 1637   CALCIUM 7.3 (L) 07/12/2023 0503   GFRNONAA 7 (L) 07/12/2023 0503   GFRAA >60 10/27/2019 2021    INR    Component Value Date/Time   INR 1.4 (H) 07/04/2023 1542     Intake/Output Summary (Last 24 hours) at 07/12/2023 6644 Last data filed at 07/11/2023 2042 Gross per 24 hour  Intake 983 ml  Output --  Net 983 ml     Assessment/Plan:  28 y.o. female for removal of infected PD catheter today  Hospital Day 8    [place], [place] [facility] [phone] 07/12/2023 6:53 AM  VASCULAR STAFF ADDENDUM: Patient seen and examined this morning.  Overall, doing well. No significant abdominal pain After discussing the risks and benefits of peritoneal dialysis catheter removal, Jazmine elected to  proceed. She has a working right sided brachiobasilic fistula that can be used for access.  Victorino Sparrow MD [facility] Phone Number: [phone] 07/12/2023 9:39 AM

## 2023-07-12 NOTE — Plan of Care (Signed)

## 2023-07-12 NOTE — Progress Notes (Signed)
 PHARMACY CONSULT NOTE FOR:  OUTPATIENT  PARENTERAL ANTIBIOTIC THERAPY   Indication: MSSA bacteremia/peritonitis  Regimen: Cefazolin 2 gm IV with dialysis 3 times a week (pending schedule)  End date: 08/09/23 (4 weeks from PD catheter removal)   No formal OPAT will be done as patient will receive antibiotics with hemodialysis.     Thank you for allowing pharmacy to be a part of this patient's care.  Sharin Mons, PharmD, BCPS, BCIDP Infectious Diseases Clinical Pharmacist Phone: [phone] 07/12/2023, 7:47 AM

## 2023-07-12 NOTE — Plan of Care (Signed)
  Problem: Clinical Measurements: Goal: Ability to maintain clinical measurements within normal limits will improve Outcome: Progressing Goal: Will remain free from infection Outcome: Progressing   Problem: Activity: Goal: Risk for activity intolerance will decrease Outcome: Progressing   Problem: Skin Integrity: Goal: Risk for impaired skin integrity will decrease Outcome: Progressing   

## 2023-07-12 NOTE — Progress Notes (Signed)
 Patient has been scheduled for PD catheter removal today.  Commended RN to hold the morning subcu insulin.   Tereasa Coop, MD [facility] 07/12/2023, 5:04 AM

## 2023-07-12 NOTE — Progress Notes (Signed)
 Patient did not have PD over the night, contacted the dialysis staff but told , they will try to come but could not make it.  Made Dr Janalyn Shy aware. Patient is NPO since 12 MN for removal of PD catheter.Thus, hold the morning dose of Heparin S/Q .

## 2023-07-12 NOTE — Anesthesia Procedure Notes (Signed)
 Procedure Name: Intubation Date/Time: 07/12/2023 9:57 AM  Performed by: Cy Blamer, CRNAPre-anesthesia Checklist: Patient identified, Emergency Drugs available, Suction available and Patient being monitored Patient Re-evaluated:Patient Re-evaluated prior to induction Oxygen Delivery Method: Circle system utilized Preoxygenation: Pre-oxygenation with 100% oxygen Induction Type: IV induction Ventilation: Mask ventilation without difficulty Laryngoscope Size: Miller and 2 Grade View: Grade I Tube type: Oral Number of attempts: 1 Airway Equipment and Method: Stylet and Bite block Placement Confirmation: ETT inserted through vocal cords under direct vision, positive ETCO2 and breath sounds checked- equal and bilateral Secured at: 21 cm Tube secured with: Tape Dental Injury: Teeth and Oropharynx as per pre-operative assessment

## 2023-07-12 NOTE — Transfer of Care (Signed)
 Immediate Anesthesia Transfer of Care Note  Patient: Lindsey Murillo  Procedure(s) Performed: REMOVAL, CATHETER, CAPD  Patient Location: PACU  Anesthesia Type:General  Level of Consciousness: awake, drowsy, patient cooperative, and responds to stimulation  Airway & Oxygen Therapy: Patient Spontanous Breathing and Patient connected to nasal cannula oxygen  Post-op Assessment: Report given to RN, Post -op Vital signs reviewed and stable, Patient moving all extremities X 4, and Patient able to stick tongue midline  Post vital signs: Reviewed and stable  Last Vitals:  Vitals Value Taken Time  BP 112/72 07/12/23 1051  Temp 36.6 C 07/12/23 1051  Pulse 92 07/12/23 1052  Resp 10 07/12/23 1052  SpO2 100 % 07/12/23 1052  Vitals shown include unfiled device data.  Last Pain:  Vitals:   07/12/23 0715  TempSrc:   PainSc: 6       Patients Stated Pain Goal: 0 (07/12/23 0715)  Complications: No notable events documented.

## 2023-07-12 NOTE — Op Note (Signed)
    NAME: Lindsey Murillo    MRN: [id number] DOB: 01/07/1996    DATE OF OPERATION: 07/12/2023  PREOP DIAGNOSIS:    Infected peritoneal dialysis catheter  POSTOP DIAGNOSIS:    Same  PROCEDURE:    Peritoneal dialysis catheter removal  SURGEON: Victorino Sparrow  ASSIST: Doreatha Massed, PA  ANESTHESIA: General  EBL: 5 mL  INDICATIONS:    Lindsey Murillo is a 28 y.o. female with end-stage renal disease requiring dialysis.  She was being dialyzed with peritoneal dialysis catheter until recently, when peritonitis was appreciated.  Vascular surgery was asked by infectious disease to remove the peritoneal dialysis catheter.  After discussing risks and benefits, Lindsey Murillo elected to proceed.  FINDINGS:   Peritoneal dialysis catheter removed without issue, no significant purulence appreciated  TECHNIQUE:   Patient was brought to the OR laid in supine position.  General anesthesia was induced and patient was prepped draped standard fashion.  An ultrasound was used to insonate the entirety of the peritoneal dialysis catheter including the 3 bumpers.  These areas were marked and three, 3 cm incisions were made at the site of these bumpers.  Cautery was used to remove the bumpers from the underlying tissue.  The entirety of the peritoneal dialysis catheter was removed without issue.  Wound sites were irrigated with copious amounts of saline and closed in layers using Vicryl with Monocryl and dermabond at the level of the skin.  The original exit site was left open.  Victorino Sparrow, MD [facility] DATE OF DICTATION:   07/12/2023

## 2023-07-12 NOTE — Progress Notes (Signed)
 Contacted by renal PA this morning that pt will require HD at d/c. Spoke to pt via phone and pt confirms she would like to stay at [facility] staff at clinic to advise staff that pt will require HD at d/c. Clinic staff to check availability and return call to navigator. Will assist as needed.   Olivia Canter Renal Navigator [phone]

## 2023-07-12 NOTE — Progress Notes (Addendum)
 PROGRESS NOTE        PATIENT DETAILS Name: Lindsey Murillo Age: 28 y.o. Sex: female Date of Birth: [birth year] Admit Date: 07/04/2023 Admitting Physician Lindsey Brilliant, DO ZOX:WRUE, Lindsey Frederickson, DO  Brief Summary: Patient is a 27 y.o.  female with history of ESRD on peritoneal dialysis, DM-1, HTN-who presented with abdominal pain-she was found to have septic shock in the setting of PD catheter associated peritonitis and MSSA bacteremia.  She was initially admitted to the ICU-required pressors-evaluated by ID/nephrology-placed on appropriate antimicrobial therapy-stabilized and subsequently transferred to [facility].  See below for further details.  Significant events: 2/25>> septic shock-PD catheter associated peritonitis/MSSA bacteremia-on Levophed 2/26 >> off Levophed 2/27>> transferred to [facility].  Significant studies: 2/24>> CXR: Strandy/patchy opacities left lower lobe-atelectasis versus pneumonia 2/24>> CT abdomen pelvis:  Fluid in the abdomen/pelvis, PD catheter in place 2/26>> echo: EF 50-55%, grade 2 diastolic dysfunction. 2/28>> TEE: No endocarditis.  Significant microbiology data: 2/24>> COVID/influenza/RSV PCR: Negative 2/24>> peritoneal fluid culture: MSSA 2/24>> blood culture: MSSA 2/25>> peritoneal fluid culture: MSSA 2/27>> blood culture: Negative 3/02>> GI pathogen panel: Negative 3/03>> peritoneal fluid culture: Pending. 3/03>> stool C. difficile studies negative  Procedures: 3/4>> PD catheter removal by VVS  Consults: PCCM Nephrology ID Opthalmology  Subjective: No major issues overnight-lying comfortably in bed.  Diarrhea is much better.  Objective: Vitals: Blood pressure 137/87, pulse (!) 110, temperature 97.8 F (36.6 C), resp. rate 14, height (P) 5\' 8"  (1.727 m), weight (P) 64.3 kg, SpO2 99%.   Exam: Gen Exam:Alert awake-not in any distress HEENT:atraumatic, normocephalic Chest: B/L clear to auscultation anteriorly CVS:S1S2  regular Abdomen:soft non tender, non distended Extremities:+ edema Neurology: Non focal Skin: no rash  Pertinent Labs/Radiology:    Latest Ref Rng & Units 07/11/2023    4:43 AM 07/10/2023    5:58 AM 07/09/2023    6:02 AM  CBC  WBC 4.0 - 10.5 K/uL 13.4  15.1  14.7   Hemoglobin 12.0 - 15.0 g/dL 9.6  9.5  9.7   Hematocrit 36.0 - 46.0 % 28.7  28.1  28.7   Platelets 150 - 400 K/uL 148  175  196     Lab Results  Component Value Date   NA 137 07/12/2023   K 4.0 07/12/2023   CL 106 07/12/2023   CO2 19 (L) 07/12/2023      Assessment/Plan: Septic shock secondary to PD catheter related MSSA peritonitis and MSSA bacteremia. Sepsis physiology resolved TEE with no endocarditis Repeat blood cultures on 2/27 negative so far Per ophthalmology-no evidence of endophthalmitis. Due to persistent leukocytosis on PD fluid cell count analysis-after discussion with ID/nephrology-vascular surgery has removed patient's PD catheter in 3/4.   Remains on IV Ancef with HD x 4 weeks recommended-EOT 4/1.  ESRD on peritoneal dialysis S/p PD catheter removed 3/4 Will need to be started on HD-CLIP process and progress.  Does have an existing AVF and right upper arm. Nephrology following  Normocytic anemia Secondary to acute illness and ESRD Follow CBC  Hypokalemia Secondary to GI loss-having some diarrhea-appears to be a chronic issue as well Potassium has now normalized with supplementation.  Diarrhea Probably related to antibiotic-however some of this is chronic issue as well GI pathogen panel/C. difficile studies negative Seems to have improved quite a bit Continue as needed Imodium.    DM-1 (A1c 7.2-2/13) CBGs relatively stable Lantus 5  units+ SSI Follow/optimize.  Recent Labs    07/12/23 0625 07/12/23 0823 07/12/23 1051  GLUCAP 81 96 81    HLD Statin  ?  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  Retinal detachment Poor historian Appreciate ophthalmology input on 2/27-resume outpatient follow-up with her primary ophthalmologist.  Pressure Ulcer: Agree with assessment and plan as outlined Pressure Injury 07/05/23 Sacrum Mid Stage 2 -  Partial thickness loss of dermis presenting as a shallow open injury with a red, pink wound bed without slough. 2x0.5 linear mid sacral wound (Active)  07/05/23 0000  Location: Sacrum  Location Orientation: Mid  Staging: Stage 2 -  Partial thickness loss of dermis presenting as a shallow open injury with a red, pink wound bed without slough.  Wound Description (Comments): 2x0.5 linear mid sacral wound  Present on Admission: Yes  Dressing Type None 07/11/23 1936    Underweight: Estimated body mass index is 21.55 kg/m (pended) as calculated from the following:   Height as of this encounter: (P) 5\' 8"  (1.727 m).   Weight as of this encounter: (P) 64.3 kg.   Code status:   Code Status: Full Code   DVT Prophylaxis: heparin injection 5,000 Units Start: 07/04/23 2230 SCDs Start: 07/04/23 2229  Family Communication: Mother at bedside   Disposition Plan: Status is: Inpatient Remains inpatient appropriate because: Severity of illness   Planned Discharge Destination:Home health   Diet: Diet Order             Diet NPO time specified Except for: Sips with Meds  Diet effective midnight                     Antimicrobial agents: Anti-infectives (From admission, onward)    Start     Dose/Rate Route Frequency Ordered Stop   07/05/23 2000  [MAR Hold]  ceFAZolin (ANCEF) IVPB 1 g/50 mL premix        (MAR Hold since Tue 07/12/2023 at 0658.Hold Reason: Transfer to a Procedural area)   1 g 100 mL/hr over 30 Minutes Intravenous Every 24 hours 07/05/23 1737     07/05/23 1800  ceFEPIme (MAXIPIME) 1 g in sodium chloride 0.9 % 100 mL IVPB  Status:  Discontinued        1 g 200 mL/hr over 30 Minutes Intravenous Every 24 hours  07/05/23 0423 07/05/23 0914   07/05/23 1800  cefTAZidime (FORTAZ) 1 g in sodium chloride 0.9 % 100 mL IVPB  Status:  Discontinued        1 g 200 mL/hr over 30 Minutes Intravenous Every 24 hours 07/05/23 0914 07/05/23 1643   07/05/23 0500  ceFEPIme (MAXIPIME) 1 g in sodium chloride 0.9 % 100 mL IVPB  Status:  Discontinued        1 g 200 mL/hr over 30 Minutes Intravenous Every 24 hours 07/05/23 0423 07/05/23 0423   07/05/23 0422  vancomycin variable dose per unstable renal function (pharmacist dosing)  Status:  Discontinued         Does not apply See admin instructions 07/05/23 0423 07/05/23 1643   07/05/23 0345  vancomycin (VANCOCIN) IVPB 1000 mg/200 mL premix  Status:  Discontinued        1,000 mg 200 mL/hr over 60 Minutes Intravenous Every 12 hours 07/05/23 0259 07/05/23 0452   07/05/23 0345  ceFEPIme (MAXIPIME) 500 mg in dextrose 5 % 50 mL IVPB  Status:  Discontinued        500 mg 110 mL/hr over 30 Minutes Intravenous Every 12 hours 07/05/23 0259 07/05/23 0423   07/04/23 1745  ceFEPIme (MAXIPIME) 2 g in sodium chloride 0.9 % 100 mL IVPB        2 g 200 mL/hr over 30 Minutes Intravenous  Once 07/04/23 1739 07/04/23 1835   07/04/23 1745  vancomycin (VANCOREADY) IVPB 1500 mg/300 mL        1,500 mg 150 mL/hr over 120 Minutes Intravenous  Once 07/04/23 1743 07/04/23 2209        MEDICATIONS: Scheduled Meds:  [MAR Hold] calcitRIOL  1 mcg Oral Daily   [MAR Hold] Chlorhexidine Gluconate Cloth  6 each Topical Daily   [MAR Hold] darbepoetin (ARANESP) injection - NON-DIALYSIS  100 mcg Subcutaneous Q Wed-1800   [MAR Hold] famotidine  10 mg Oral Daily   [MAR Hold] feeding supplement (NEPRO CARB STEADY)  237 mL Oral BID BM   [MAR Hold] gentamicin cream  1 Application Topical Daily   [MAR Hold] gentamicin cream  1 Application Topical Daily   [MAR Hold] heparin  5,000 Units Subcutaneous Q8H   [MAR Hold] insulin aspart  0-6 Units Subcutaneous TID WC   [MAR Hold] insulin glargine  5 Units  Subcutaneous QHS   [MAR Hold] midodrine  5 mg Oral TID WC   [MAR Hold] multivitamin  1 tablet Oral Daily   [MAR Hold] potassium chloride  40 mEq Oral BID   [MAR Hold] rosuvastatin  10 mg Oral Daily   [MAR Hold] sodium chloride flush  3-10 mL Intravenous Q12H   Continuous Infusions:  sodium chloride Stopped (07/12/23 1046)   [MAR Hold]  ceFAZolin (ANCEF) IV Stopped (07/11/23 2030)   [MAR Hold] dialysis solution 1.5% low-MG/low-CA     PRN Meds:.[MAR Hold] acetaminophen, [MAR Hold] docusate sodium, [MAR Hold]  HYDROmorphone (DILAUDID) injection, [MAR Hold] loperamide, [MAR Hold] ondansetron (ZOFRAN) IV, [MAR Hold] mouth rinse, [MAR Hold] polyethylene glycol, [MAR Hold] polyvinyl alcohol, [MAR Hold] prochlorperazine, [MAR Hold] sodium chloride flush   I have personally reviewed following labs and imaging studies  LABORATORY DATA: CBC: Recent Labs  Lab 07/07/23 0520 07/08/23 0608 07/09/23 0602 07/10/23 0558 07/11/23 0443  WBC 13.5* 13.8* 14.7* 15.1* 13.4*  NEUTROABS  --  11.4*  --   --   --   HGB 10.0* 10.5* 9.7* 9.5* 9.6*  HCT 29.9* 30.7* 28.7* 28.1* 28.7*  MCV 87.2 86.5 87.8 86.5 86.4  PLT 205 211 196 175 148*    Basic Metabolic Panel: Recent Labs  Lab 07/06/23 0325 07/07/23 0520 07/08/23 0608 07/09/23 0602 07/10/23 0558 07/11/23 0443 07/12/23 0503  NA 132*  134* 131* 134* 133* 135 134* 137  K 3.5  3.6 3.3* 3.2* 3.0* 3.0* 3.2* 4.0  CL 100  101 100 101 100 99 103 106  CO2 18*  20* 17* 19* 20* 21* 22 19*  GLUCOSE 208*  209* 201* 90 202* 183* 197* 75  BUN 35*  34* 33* 29* 27* 24* 24* 26*  CREATININE 8.88*  8.86* 7.91* 7.68* 7.37* 7.35* 7.03* 7.92*  CALCIUM 7.5*  7.5* 7.2* 7.1* 6.9* 7.2* 6.9* 7.3*  MG 1.9 1.9  --   --   --  1.7  --   PHOS 3.6 3.3 3.0 2.9 2.5 2.4* 3.1    GFR: Estimated Creatinine Clearance: 10.7 mL/min (A) (by C-G formula based on SCr of 7.92 mg/dL (H)).  Liver Function Tests: Recent Labs  Lab 07/08/23 [phone] 07/09/23 0602  07/10/23 0558  07/11/23 0443 07/12/23 0503  ALBUMIN <1.5* <1.5* <1.5* <1.5* <1.5*   No results for input(s): "LIPASE", "AMYLASE" in the last 168 hours. No results for input(s): "AMMONIA" in the last 168 hours.  Coagulation Profile: No results for input(s): "INR", "PROTIME" in the last 168 hours.   Cardiac Enzymes: No results for input(s): "CKTOTAL", "CKMB", "CKMBINDEX", "TROPONINI" in the last 168 hours.  BNP (last 3 results) No results for input(s): "PROBNP" in the last 8760 hours.  Lipid Profile: No results for input(s): "CHOL", "HDL", "LDLCALC", "TRIG", "CHOLHDL", "LDLDIRECT" in the last 72 hours.  Thyroid Function Tests: No results for input(s): "TSH", "T4TOTAL", "FREET4", "T3FREE", "THYROIDAB" in the last 72 hours.  Anemia Panel: No results for input(s): "VITAMINB12", "FOLATE", "FERRITIN", "TIBC", "IRON", "RETICCTPCT" in the last 72 hours.  Urine analysis:    Component Value Date/Time   COLORURINE YELLOW 10/07/2021 1325   APPEARANCEUR TURBID (A) 10/07/2021 1325   LABSPEC 1.011 10/07/2021 1325   PHURINE 5.0 10/07/2021 1325   GLUCOSEU 50 (A) 10/07/2021 1325   HGBUR SMALL (A) 10/07/2021 1325   BILIRUBINUR NEGATIVE 10/07/2021 1325   KETONESUR NEGATIVE 10/07/2021 1325   PROTEINUR 100 (A) 10/07/2021 1325   UROBILINOGEN 0.2 01/07/2015 1723   NITRITE NEGATIVE 10/07/2021 1325   LEUKOCYTESUR LARGE (A) 10/07/2021 1325    Sepsis Labs: Lactic Acid, Venous    Component Value Date/Time   LATICACIDVEN 0.7 07/04/2023 2109    MICROBIOLOGY: Recent Results (from the past 240 hours)  Culture, blood (Routine x 2)     Status: Abnormal   Collection Time: 07/04/23  3:42 PM   Specimen: BLOOD  Result Value Ref Range Status   Specimen Description BLOOD SITE NOT SPECIFIED  Final   Special Requests   Final    BOTTLES DRAWN AEROBIC AND ANAEROBIC Blood Culture results may not be optimal due to an inadequate volume of blood received in culture bottles   Culture  Setup Time   Final    GRAM POSITIVE  COCCI IN CLUSTERS AEROBIC BOTTLE ONLY CRITICAL RESULT CALLED TO, READ BACK BY AND VERIFIED WITH: PHARMD ADaphane Shepherd [id number] @ 1604 FH Performed at [facility], [address] [place], [place] [address]    Culture STAPHYLOCOCCUS AUREUS (A)  Final   Report Status 07/07/2023 FINAL  Final   Organism ID, Bacteria STAPHYLOCOCCUS AUREUS  Final      Susceptibility   Staphylococcus aureus - MIC*    CIPROFLOXACIN <=0.5 SENSITIVE Sensitive     ERYTHROMYCIN <=0.25 SENSITIVE Sensitive     GENTAMICIN <=0.5 SENSITIVE Sensitive     OXACILLIN <=0.25 SENSITIVE Sensitive     TETRACYCLINE <=1 SENSITIVE Sensitive     VANCOMYCIN 1 SENSITIVE Sensitive     TRIMETH/SULFA <=10 SENSITIVE Sensitive     CLINDAMYCIN <=0.25 SENSITIVE Sensitive     RIFAMPIN <=0.5 SENSITIVE Sensitive     Inducible Clindamycin NEGATIVE Sensitive     LINEZOLID 2 SENSITIVE Sensitive     * STAPHYLOCOCCUS AUREUS  Blood Culture ID Panel (Reflexed)     Status: Abnormal   Collection Time: 07/04/23  3:42 PM  Result Value Ref Range Status   Enterococcus faecalis NOT DETECTED NOT DETECTED Final   Enterococcus Faecium NOT DETECTED NOT DETECTED Final   Listeria monocytogenes NOT DETECTED NOT DETECTED Final   Staphylococcus species DETECTED (A) NOT DETECTED Final    Comment: CRITICAL RESULT CALLED TO, READ BACK BY AND VERIFIED WITH: PHARMD A. MEYER [id number] @ 1604 FH    Staphylococcus aureus (BCID) DETECTED (  A) NOT DETECTED Final    Comment: CRITICAL RESULT CALLED TO, READ BACK BY AND VERIFIED WITH: PHARMD A. MEYER [id number] @ 1604 FH    Staphylococcus epidermidis NOT DETECTED NOT DETECTED Final   Staphylococcus lugdunensis NOT DETECTED NOT DETECTED Final   Streptococcus species NOT DETECTED NOT DETECTED Final   Streptococcus agalactiae NOT DETECTED NOT DETECTED Final   Streptococcus pneumoniae NOT DETECTED NOT DETECTED Final   Streptococcus pyogenes NOT DETECTED NOT DETECTED Final   A.calcoaceticus-baumannii NOT DETECTED NOT DETECTED  Final   Bacteroides fragilis NOT DETECTED NOT DETECTED Final   Enterobacterales NOT DETECTED NOT DETECTED Final   Enterobacter cloacae complex NOT DETECTED NOT DETECTED Final   Escherichia coli NOT DETECTED NOT DETECTED Final   Klebsiella aerogenes NOT DETECTED NOT DETECTED Final   Klebsiella oxytoca NOT DETECTED NOT DETECTED Final   Klebsiella pneumoniae NOT DETECTED NOT DETECTED Final   Proteus species NOT DETECTED NOT DETECTED Final   Salmonella species NOT DETECTED NOT DETECTED Final   Serratia marcescens NOT DETECTED NOT DETECTED Final   Haemophilus influenzae NOT DETECTED NOT DETECTED Final   Neisseria meningitidis NOT DETECTED NOT DETECTED Final   Pseudomonas aeruginosa NOT DETECTED NOT DETECTED Final   Stenotrophomonas maltophilia NOT DETECTED NOT DETECTED Final   Candida albicans NOT DETECTED NOT DETECTED Final   Candida auris NOT DETECTED NOT DETECTED Final   Candida glabrata NOT DETECTED NOT DETECTED Final   Candida krusei NOT DETECTED NOT DETECTED Final   Candida parapsilosis NOT DETECTED NOT DETECTED Final   Candida tropicalis NOT DETECTED NOT DETECTED Final   Cryptococcus neoformans/gattii NOT DETECTED NOT DETECTED Final   Meth resistant mecA/C and MREJ NOT DETECTED NOT DETECTED Final    Comment: Performed at [facility], [address] [place], [place] [address]  Resp panel by RT-PCR (RSV, Flu A&B, Covid) Anterior Nasal Swab     Status: None   Collection Time: 07/04/23  3:46 PM   Specimen: Anterior Nasal Swab  Result Value Ref Range Status   SARS Coronavirus 2 by RT PCR NEGATIVE NEGATIVE Final   Influenza A by PCR NEGATIVE NEGATIVE Final   Influenza B by PCR NEGATIVE NEGATIVE Final    Comment: (NOTE) The Xpert Xpress SARS-CoV-2/FLU/RSV plus assay is intended as an aid in the diagnosis of influenza from Nasopharyngeal swab specimens and should not be used as a sole basis for treatment. Nasal washings and aspirates are unacceptable for Xpert Xpress  SARS-CoV-2/FLU/RSV testing.  Fact Sheet for Patients: BloggerCourse.com  Fact Sheet for Healthcare Providers: SeriousBroker.it  This test is not yet approved or cleared by the [place] FDA and has been authorized for detection and/or diagnosis of SARS-CoV-2 by FDA under an Emergency Use Authorization (EUA). This EUA will remain in effect (meaning this test can be used) for the duration of the COVID-19 declaration under Section 564(b)(1) of the Act, 21 U.S.C. section 360bbb-3(b)(1), unless the authorization is terminated or revoked.     Resp Syncytial Virus by PCR NEGATIVE NEGATIVE Final    Comment: (NOTE) Fact Sheet for Patients: BloggerCourse.com  Fact Sheet for Healthcare Providers: SeriousBroker.it  This test is not yet approved or cleared by the [place] FDA and has been authorized for detection and/or diagnosis of SARS-CoV-2 by FDA under an Emergency Use Authorization (EUA). This EUA will remain in effect (meaning this test can be used) for the duration of the COVID-19 declaration under Section 564(b)(1) of the Act, 21 U.S.C. section 360bbb-3(b)(1), unless the authorization is terminated or revoked.  Performed at [facility], [address], [place], [place] [address]   Body fluid culture w Gram Stain     Status: None   Collection Time: 07/04/23  7:34 PM   Specimen: Peritoneal Dialysate; Body Fluid  Result Value Ref Range Status   Specimen Description PERITONEAL DIALYSATE ABDOMEN  Final   Special Requests NONE  Final   Gram Stain   Final    ABUNDANT WBC PRESENT, PREDOMINANTLY PMN NO ORGANISMS SEEN Performed at [facility], [address] [place], [place] [address]    Culture MODERATE STAPHYLOCOCCUS AUREUS  Final   Report Status 07/06/2023 FINAL  Final   Organism ID, Bacteria STAPHYLOCOCCUS AUREUS  Final      Susceptibility   Staphylococcus  aureus - MIC*    CIPROFLOXACIN <=0.5 SENSITIVE Sensitive     ERYTHROMYCIN <=0.25 SENSITIVE Sensitive     GENTAMICIN <=0.5 SENSITIVE Sensitive     OXACILLIN <=0.25 SENSITIVE Sensitive     TETRACYCLINE <=1 SENSITIVE Sensitive     VANCOMYCIN 1 SENSITIVE Sensitive     TRIMETH/SULFA <=10 SENSITIVE Sensitive     CLINDAMYCIN <=0.25 SENSITIVE Sensitive     RIFAMPIN <=0.5 SENSITIVE Sensitive     Inducible Clindamycin NEGATIVE Sensitive     LINEZOLID 2 SENSITIVE Sensitive     * MODERATE STAPHYLOCOCCUS AUREUS  Culture, blood (Routine x 2)     Status: None   Collection Time: 07/04/23  9:08 PM   Specimen: BLOOD LEFT ARM  Result Value Ref Range Status   Specimen Description BLOOD LEFT ARM  Final   Special Requests   Final    BOTTLES DRAWN AEROBIC AND ANAEROBIC Blood Culture results may not be optimal due to an inadequate volume of blood received in culture bottles   Culture   Final    NO GROWTH 5 DAYS Performed at [facility], [address] [place], [place] [address]    Report Status 07/09/2023 FINAL  Final  MRSA Next Gen by PCR, Nasal     Status: None   Collection Time: 07/05/23 12:12 AM   Specimen: Nasal Mucosa; Nasal Swab  Result Value Ref Range Status   MRSA by PCR Next Gen NOT DETECTED NOT DETECTED Final    Comment: (NOTE) The GeneXpert MRSA Assay (FDA approved for NASAL specimens only), is one component of a comprehensive MRSA colonization surveillance program. It is not intended to diagnose MRSA infection nor to guide or monitor treatment for MRSA infections. Test performance is not FDA approved in patients less than 28 years old. Performed at [facility], [address], [place], [place] [address]   Body fluid culture w Gram Stain     Status: None   Collection Time: 07/05/23  7:19 AM   Specimen: Peritoneal Washings; Body Fluid  Result Value Ref Range Status   Specimen Description PERITONEAL  Final   Special Requests NONE  Final   Gram Stain   Final    ABUNDANT  WBC PRESENT, PREDOMINANTLY PMN RARE GRAM POSITIVE COCCI Performed at [facility], [address], [place], [place] [address]    Culture FEW STAPHYLOCOCCUS AUREUS  Final   Report Status 07/07/2023 FINAL  Final   Organism ID, Bacteria STAPHYLOCOCCUS AUREUS  Final      Susceptibility   Staphylococcus aureus - MIC*    CIPROFLOXACIN <=0.5 SENSITIVE Sensitive     ERYTHROMYCIN <=0.25 SENSITIVE Sensitive     GENTAMICIN <=0.5 SENSITIVE Sensitive     OXACILLIN 0.5  SENSITIVE Sensitive     TETRACYCLINE <=1 SENSITIVE Sensitive     VANCOMYCIN 1 SENSITIVE Sensitive     TRIMETH/SULFA <=10 SENSITIVE Sensitive     CLINDAMYCIN <=0.25 SENSITIVE Sensitive     RIFAMPIN <=0.5 SENSITIVE Sensitive     Inducible Clindamycin NEGATIVE Sensitive     LINEZOLID 2 SENSITIVE Sensitive     * FEW STAPHYLOCOCCUS AUREUS  Culture, blood (Routine X 2) w Reflex to ID Panel     Status: None   Collection Time: 07/07/23  5:20 AM   Specimen: BLOOD LEFT HAND  Result Value Ref Range Status   Specimen Description BLOOD LEFT HAND  Final   Special Requests   Final    BOTTLES DRAWN AEROBIC ONLY Blood Culture results may not be optimal due to an inadequate volume of blood received in culture bottles   Culture   Final    NO GROWTH 5 DAYS Performed at [facility], [address], [place], [place] [address]    Report Status 07/12/2023 FINAL  Final  Culture, blood (Routine X 2) w Reflex to ID Panel     Status: None   Collection Time: 07/07/23  5:27 AM   Specimen: BLOOD LEFT ARM  Result Value Ref Range Status   Specimen Description BLOOD LEFT ARM  Final   Special Requests   Final    BOTTLES DRAWN AEROBIC AND ANAEROBIC Blood Culture adequate volume   Culture   Final    NO GROWTH 5 DAYS Performed at [facility], [address], [place], [place] [address]    Report Status 07/12/2023 FINAL  Final  Gastrointestinal Panel by PCR , Stool     Status: None   Collection Time: 07/10/23 11:22 AM   Specimen: Stool   Result Value Ref Range Status   Campylobacter species NOT DETECTED NOT DETECTED Final   Plesimonas shigelloides NOT DETECTED NOT DETECTED Final   Salmonella species NOT DETECTED NOT DETECTED Final   Yersinia enterocolitica NOT DETECTED NOT DETECTED Final   Vibrio species NOT DETECTED NOT DETECTED Final   Vibrio cholerae NOT DETECTED NOT DETECTED Final   Enteroaggregative E coli (EAEC) NOT DETECTED NOT DETECTED Final   Enteropathogenic E coli (EPEC) NOT DETECTED NOT DETECTED Final   Enterotoxigenic E coli (ETEC) NOT DETECTED NOT DETECTED Final   Shiga like toxin producing E coli (STEC) NOT DETECTED NOT DETECTED Final   Shigella/Enteroinvasive E coli (EIEC) NOT DETECTED NOT DETECTED Final   Cryptosporidium NOT DETECTED NOT DETECTED Final   Cyclospora cayetanensis NOT DETECTED NOT DETECTED Final   Entamoeba histolytica NOT DETECTED NOT DETECTED Final   Giardia lamblia NOT DETECTED NOT DETECTED Final   Adenovirus F40/41 NOT DETECTED NOT DETECTED Final   Astrovirus NOT DETECTED NOT DETECTED Final   Norovirus GI/GII NOT DETECTED NOT DETECTED Final   Rotavirus A NOT DETECTED NOT DETECTED Final   Sapovirus (I, II, IV, and V) NOT DETECTED NOT DETECTED Final    Comment: Performed at [facility], [address] [place], [place] [address]  Body fluid culture w Gram Stain     Status: None (Preliminary result)   Collection Time: 07/11/23  1:25 AM   Specimen: Peritoneal Dialysate; Body Fluid  Result Value Ref Range Status   Specimen Description PERITONEAL DIALYSATE FLUID  Final   Special Requests NONE  Final   Gram Stain   Final    RARE WBC PRESENT, PREDOMINANTLY PMN NO ORGANISMS SEEN Performed at [facility], [address] [place], [place] [address]    Culture PENDING  Incomplete   Report Status PENDING  Incomplete  C Difficile Quick Screen (NO PCR Reflex)     Status: None   Collection Time: 07/11/23 10:41 AM   Specimen: STOOL  Result Value Ref Range Status   C Diff  antigen NEGATIVE NEGATIVE Final   C Diff toxin NEGATIVE NEGATIVE Final   C Diff interpretation No C. difficile detected.  Final    Comment: Performed at [facility], [address] [place], [place] [address]    RADIOLOGY STUDIES/RESULTS: No results found.    LOS: 8 days   Jeoffrey Massed, MD  [facility]    To contact the attending provider between 7A-7P or the covering provider during after hours 7P-7A, please log into the web site [email or web address] and access using universal [place] password for that web site. If you do not have the password, please call the hospital operator.  07/12/2023, 11:39 AM

## 2023-07-12 NOTE — Progress Notes (Addendum)
 [facility] Progress Note  Name: Lindsey Murillo MRN: [id number] DOB: 26-Jul-1995  Chief Complaint:  Abdominal pain  Subjective:  Seen and examined at bedside. Has some nausea after procedure --PD cathter removed in OR today. Plan to resume  HD this afternoon.    Background on consult:  Lindsey Murillo is a 28 y.o. female with a PMH significant for type 1 DM,  HTN, autoimmune thyroiditis, and ESRD on CCPD who presented to [facility] ED today after developing abdominal pain this morning.  She has not been feeling well and has had N/V/D for the past 4-5 days as well as a cough.  She denies any cloudy PD fluid and reports that her abdominal pain just started today.  In the ED, temp max 100.7, bp 82/46, HR 110, SpO2 93% on room air.  Labs notable for WBC 10, Hgb 10.5, plt 244, Na 134, K 2.6, Co2 17, BUN 36, Cr 10.6, Ca 7.8, alb 1.7.  Presentation concerning for SBP and we were consulted to manage her PD and send off PD fluid for studies during her hospitalization.  Of note, she was not doing her PD correctly for the past week and was 10 kg above edw.  She was not doing the proper amount of exchanges and was reabsorbing.  She declined coming in for HD session to help with her volume, however she declined and performed CAPD at home.  She now appears hypovolemic due to vomiting and diarrhea which predated her abdominal pain and may have a viral gastroenteritis.  Outpatient PD RN reports pt on 6 exchanges over night with 2.4 fills.  Dwell time 1.5 hours.  EDW is 50 kg. She was just changed from CAPD to CCPD.  Mircera 50 mcg on 2/19 and receives this every two weeks usually or her hemoglobin will drop    Intake/Output Summary (Last 24 hours) at 07/12/2023 1240 Last data filed at 07/12/2023 1046 Gross per 24 hour  Intake 813 ml  Output 5 ml  Net 808 ml    Vitals:  Vitals:   07/12/23 1100 07/12/23 1115 07/12/23 1130 07/12/23 1159  BP: 119/79 124/82 137/87 (!) 134/90  Pulse: 98 (!) 110   (!) 109  Resp: 12 14  14   Temp:  97.8 F (36.6 C)  (!) 97.5 F (36.4 C)  TempSrc:    Oral  SpO2: 99% 99%  98%  Weight:      Height:         Physical Exam:     General adult female in bed in no acute distress Lungs clear  Heart S1S2 no rub Abdomen soft slightly tender to palpation; PD catheter in place  Extremities 1+ edema lower extremities with legs dependent  Psych normal mood and affect Neuro alert and oriented x 3 provides hx follows commands Access: PD catheter as above.  RUE AVF with bruit and thrill   Medications reviewed   Labs:     Latest Ref Rng & Units 07/12/2023    5:03 AM 07/11/2023    4:43 AM 07/10/2023    5:58 AM  BMP  Glucose 70 - 99 mg/dL 75  409  811   BUN 6 - 20 mg/dL 26  24  24    Creatinine 0.44 - 1.00 mg/dL 9.14  7.82  9.56   Sodium 135 - 145 mmol/L 137  134  135   Potassium 3.5 - 5.1 mmol/L 4.0  3.2  3.0   Chloride 98 - 111 mmol/L 106  103  99   CO2 22 - 32 mmol/L 19  22  21    Calcium 8.9 - 10.3 mg/dL 7.3  6.9  7.2      Assessment/Plan:   PD-associated Bacterial peritonitis -  Moderate staph aureus on PD fluid culture On cefazolin  Cell count trend variable.  Clinically improving ID recommended removal of PD cath - removed 07/12/23 per [facility].  Continue cefazolin 2g IV q HD x 4 weeks  ESRD -   Transition back to IHD.  R AVF  in place. HD today. Renal navigator notified for CLIP to center.  Staph aureus bacteremia - on cefazolin.  As above. Continue cefazolin x 4 weeks.  Type 1 DM - per primary team Hypertension/volume  - Midodrine was started here, but BP creeping up. Lindsey edema on exam and well above out patient dry weight. Attempt 2L UF today  Anemia of CKD - trend Hb.  She got mircera 50 mcg on 2/19 and receives this every two weeks usually or her hemoglobin will drop  Ordered aranesp 100 mcg every Wednesday for 07/13/23 Metabolic bone disease -  phos controlled. On calcitriol.  Have stopped sensipar for now due to hypocalcemia Hypokalemia - likely  from GI losses. Improved. Resuming IHD - will stop KCl Diarrhea - work-up and supportive care per primary team.  They are starting immodium    Tomasa Blase, PA-C 07/12/2023 12:40 PM

## 2023-07-12 NOTE — Anesthesia Preprocedure Evaluation (Signed)
 Anesthesia Evaluation  Patient identified by MRN, date of birth, ID band Patient awake    Reviewed: Allergy & Precautions, NPO status , Patient's Chart, lab work & pertinent test results, reviewed documented beta blocker date and time   History of Anesthesia Complications Negative for: history of anesthetic complications  Airway Mallampati: II  TM Distance: >3 FB     Dental no notable dental hx.    Pulmonary asthma , neg sleep apnea, neg COPD, neg recent URI, neg PE   breath sounds clear to auscultation       Cardiovascular hypertension, (-) CAD, (-) Past MI and (-) Cardiac Stents  Rhythm:Regular Rate:Normal     Neuro/Psych  PSYCHIATRIC DISORDERS  Depression     Neuromuscular disease    GI/Hepatic ,GERD  Medicated,,(+) neg Cirrhosis        Endo/Other  diabetes, Type 1    Renal/GU ESRF and DialysisRenal disease     Musculoskeletal   Abdominal   Peds  Hematology  (+) Blood dyscrasia, anemia   Anesthesia Other Findings   Reproductive/Obstetrics                              Anesthesia Physical Anesthesia Plan  ASA: 3  Anesthesia Plan: General   Post-op Pain Management:    Induction: Intravenous  PONV Risk Score and Plan: 2 and Ondansetron  Airway Management Planned: Oral ETT  Additional Equipment:   Intra-op Plan:   Post-operative Plan: Extubation in OR  Informed Consent: I have reviewed the patients History and Physical, chart, labs and discussed the procedure including the risks, benefits and alternatives for the proposed anesthesia with the patient or authorized representative who has indicated his/her understanding and acceptance.     Dental advisory given  Plan Discussed with: CRNA  Anesthesia Plan Comments:          Anesthesia Quick Evaluation

## 2023-07-13 ENCOUNTER — Encounter (HOSPITAL_COMMUNITY): Payer: Self-pay | Admitting: Vascular Surgery

## 2023-07-13 DIAGNOSIS — Z4802 Encounter for removal of sutures: Secondary | ICD-10-CM | POA: Insufficient documentation

## 2023-07-13 DIAGNOSIS — A419 Sepsis, unspecified organism: Secondary | ICD-10-CM | POA: Diagnosis not present

## 2023-07-13 DIAGNOSIS — R6521 Severe sepsis with septic shock: Secondary | ICD-10-CM | POA: Diagnosis not present

## 2023-07-13 LAB — CBC
HCT: 27.3 % — ABNORMAL LOW (ref 36.0–46.0)
Hemoglobin: 9.2 g/dL — ABNORMAL LOW (ref 12.0–15.0)
MCH: 29.4 pg (ref 26.0–34.0)
MCHC: 33.7 g/dL (ref 30.0–36.0)
MCV: 87.2 fL (ref 80.0–100.0)
Platelets: 138 10*3/uL — ABNORMAL LOW (ref 150–400)
RBC: 3.13 MIL/uL — ABNORMAL LOW (ref 3.87–5.11)
RDW: 20.1 % — ABNORMAL HIGH (ref 11.5–15.5)
WBC: 17.7 10*3/uL — ABNORMAL HIGH (ref 4.0–10.5)
nRBC: 0 % (ref 0.0–0.2)

## 2023-07-13 LAB — RENAL FUNCTION PANEL
Albumin: <1.5 g/dL — ABNORMAL LOW (ref 3.5–5.0)
Anion gap: 3 — ABNORMAL LOW (ref 5–15)
BUN: 13 mg/dL (ref 6–20)
CO2: 25 mmol/L (ref 22–32)
Calcium: 7.3 mg/dL — ABNORMAL LOW (ref 8.9–10.3)
Chloride: 105 mmol/L (ref 98–111)
Creatinine, Ser: 5.03 mg/dL — ABNORMAL HIGH (ref 0.44–1.00)
GFR, Estimated: 11 mL/min — ABNORMAL LOW (ref >60)
Glucose, Bld: 102 mg/dL — ABNORMAL HIGH (ref 70–99)
Phosphorus: 2.3 mg/dL — ABNORMAL LOW (ref 2.5–4.6)
Potassium: 3.5 mmol/L (ref 3.5–5.1)
Sodium: 133 mmol/L — ABNORMAL LOW (ref 135–145)

## 2023-07-13 LAB — GLUCOSE, CAPILLARY
Glucose-Capillary: 71 mg/dL (ref 70–99)
Glucose-Capillary: 105 mg/dL — ABNORMAL HIGH (ref 70–99)

## 2023-07-13 LAB — HEPATITIS B SURFACE ANTIBODY, QUANTITATIVE: Hep B S AB Quant (Post): 24.2 m[IU]/mL

## 2023-07-13 MED ORDER — MIDODRINE HCL 5 MG PO TABS: 5.0000 mg | ORAL_TABLET | Freq: Three times a day (TID) | ORAL | 0 refills | Status: DC

## 2023-07-13 MED ORDER — CEFAZOLIN SODIUM-DEXTROSE 1-4 GM/50ML-% IV SOLN: 2.0000 g | INTRAVENOUS | Status: AC | PRN

## 2023-07-13 MED ORDER — CEFAZOLIN SODIUM-DEXTROSE 1-4 GM/50ML-% IV SOLN: 1.0000 g | INTRAVENOUS | Status: DC | PRN

## 2023-07-13 NOTE — TOC Transition Note (Signed)
 Transition of Care [facility]) - Discharge Note   Patient Details  Name: Lindsey Murillo MRN: [id number] Date of Birth: [birth year]  [facility] CM/SW Contact:  Tom-[name], Hershal Coria, RN Phone Number: 07/13/2023, 11:29 AM   Clinical Narrative:     Patient is scheduled for discharge today.  Readmission Risk Assessment done. Outpatient f/u, hospital f/u and discharge instructions on AVS. No TOC needs or recommendations noted. Brother, Verdell Face to transport at discharge.  No further TOC needs noted.        Final next level of care: Home/Self Care Barriers to Discharge: Barriers Resolved   Patient Goals and CMS Choice Patient states their goals for this hospitalization and ongoing recovery are:: To return home CMS Medicare.gov Compare Post Acute Care list provided to:: Patient Choice offered to / list presented to : NA      Discharge Placement                Patient to be transferred to facility by: Brother Name of family member notified: Javante    Discharge Plan and Services Additional resources added to the After Visit Summary for                  DME Arranged: N/A DME Agency: NA       HH Arranged: NA HH Agency: NA        Social Drivers of Health (SDOH) Interventions SDOH Screenings   Food Insecurity: Patient Declined (07/05/2023)  Housing: Patient Declined (07/05/2023)  Transportation Needs: Patient Declined (07/05/2023)  Utilities: Patient Declined (07/05/2023)  Alcohol Screen: Low Risk  (01/14/2023)  Depression (PHQ2-9): Low Risk  (01/14/2023)  Recent Concern: Depression (PHQ2-9) - Medium Risk (10/26/2022)  Financial Resource Strain: Low Risk  (04/28/2023)   Received from [facility]  Physical Activity: Insufficiently Active (01/14/2023)  Social Connections: Moderately Isolated (01/14/2023)  Stress: No Stress Concern Present (05/24/2023)   Received from [facility]  Tobacco Use: Low Risk  (07/12/2023)  Health Literacy: Adequate Health  Literacy (01/14/2023)     Readmission Risk Interventions    07/13/2023   11:28 AM  Readmission Risk Prevention Plan  Transportation Screening Complete  PCP or Specialist Appt within 3-5 Days Complete  HRI or Home Care Consult Complete  Social Work Consult for Recovery Care Planning/Counseling Complete  Palliative Care Screening Not Applicable  Medication Review Oceanographer) Referral to Pharmacy

## 2023-07-13 NOTE — Discharge Summary (Addendum)
 Physician Discharge Summary  Lindsey Murillo ZOX:[id number] DOB: 06/07/1995 DOA: 07/04/2023  PCP: [name], DO  Admit date: 07/04/2023 Discharge date: 07/13/2023  Admitted From: Home Disposition: Home  Recommendations for Outpatient Follow-up:  As per dialysis schedule  Home Health: N/A Equipment/Devices: N/A  Discharge Condition: Stable CODE STATUS: Full code Diet recommendation: Low-salt and low-carb diet, nutritional supplements.  Discharge summary: Patient is a 28 y.o.  female with history of ESRD on peritoneal dialysis, DM-1, HTN-who presented with abdominal pain-she was found to have septic shock in the setting of PD catheter associated peritonitis and MSSA bacteremia.  She was initially admitted to the ICU-required pressors-evaluated by ID/nephrology-placed on appropriate antimicrobial therapy-stabilized and subsequently transferred to [facility].  See below for further details.   Significant events: 2/25>> septic shock-PD catheter associated peritonitis/MSSA bacteremia-on Levophed 2/26 >> off Levophed 2/27>> transferred to [facility].   Significant studies: 2/24>> CXR: Strandy/patchy opacities left lower lobe-atelectasis versus pneumonia 2/24>> CT abdomen pelvis:  Fluid in the abdomen/pelvis, PD catheter in place 2/26>> echo: EF 50-55%, grade 2 diastolic dysfunction. 2/28>> TEE: No endocarditis.   Significant microbiology data: 2/24>> COVID/influenza/RSV PCR: Negative 2/24>> peritoneal fluid culture: MSSA 2/24>> blood culture: MSSA 2/25>> peritoneal fluid culture: MSSA 2/27>> blood culture: Negative 3/02>> GI pathogen panel: Negative 3/03>> peritoneal fluid culture: negative.  3/03>> stool C. difficile studies negative   Procedures: 3/4>> PD catheter removal by VVS   Consults: PCCM Nephrology ID Opthalmology     Septic shock secondary to PD catheter related MSSA peritonitis and MSSA bacteremia. Sepsis physiology resolved TEE with no endocarditis Repeat blood  cultures on 2/27 negative so far Per ophthalmology-no evidence of endophthalmitis. Due to persistent leukocytosis on PD fluid cell count analysis-after discussion with ID/nephrology-vascular surgery has removed patient's PD catheter in 3/4.   Remains on IV Ancef with HD x 4 weeks recommended-EOT 4/1. Antibiotics to be done with HD , communicated and co-ordinated by Nephrology team  Ancef 2 g with dialysis until 4/1.   ESRD on peritoneal dialysis S/p PD catheter removed 3/4 Right arm AV fistula.  Functional.  Received dialysis overnight.   Normocytic anemia Secondary to acute illness and ESRD Stable.   Hypokalemia Secondary to GI loss-having some diarrhea-appears to be a chronic issue as well Potassium has now normalized with supplementation.   Diarrhea Probably related to antibiotic-however some of this is chronic issue as well GI pathogen panel/C. difficile studies negative Seems to have improved quite a bit Continue as needed Imodium.  Improving.   DM-1 (A1c 7.2-2/13) CBGs relatively stable Resume home regimen of insulin.   HLD Statin   Chronic left eye vision loss-?  Retinal detachment Poor historian Appreciate ophthalmology input on 2/27-resume outpatient follow-up with her primary ophthalmologist.   Pressure Ulcer: Agree with assessment and plan as outlined     Pressure Injury 07/05/23 Sacrum Mid Stage 2 -  Partial thickness loss of dermis presenting as a shallow open injury with a red, pink wound bed without slough. 2x0.5 linear mid sacral wound (Active)  07/05/23 0000  Location: Sacrum  Location Orientation: Mid  Staging: Stage 2 -  Partial thickness loss of dermis presenting as a shallow open injury with a red, pink wound bed without slough.  Wound Description (Comments): 2x0.5 linear mid sacral wound  Present on Admission: Yes  Dressing Type None 07/11/23 1936      Medically stable for discharge.  Next dialysis tomorrow at outpatient center.  Renal navigator  confirmed dialysis schedule pending antibiotic availability.  Discharge Diagnoses:  Principal Problem:   Septic shock ([name]) Active Problems:   Dialysis-associated peritonitis ([name])   Pressure injury of skin   MSSA bacteremia   Peritonitis [facility])    Discharge Instructions  Discharge Instructions     Diet - low sodium heart healthy   Complete by: As directed    Diet Carb Modified   Complete by: As directed    Discharge wound care:   Complete by: As directed    Keep abdominal wound, clean , dry and covered   Increase activity slowly   Complete by: As directed       Allergies as of 07/13/2023   No Known Allergies      Medication List     STOP taking these medications    cinacalcet 30 MG tablet Commonly known as: SENSIPAR   fluticasone 50 MCG/ACT nasal spray Commonly known as: FLONASE   potassium chloride SA 20 MEQ tablet Commonly known as: KLOR-CON M   scopolamine 1 MG/3DAYS Commonly known as: TRANSDERM-SCOP       TAKE these medications    acetaminophen 325 MG tablet Commonly known as: TYLENOL Take 325 mg by mouth every 6 (six) hours as needed for mild pain (pain score 1-3) or moderate pain (pain score 4-6).   calcitRIOL 0.5 MCG capsule Commonly known as: ROCALTROL Take 1 mcg by mouth daily.   ceFAZolin 1-4 GM/50ML-% Soln Commonly known as: ANCEF Inject 100 mLs (2 g total) into the vein every dialysis for up to 27 days.   cetirizine HCl 5 MG/5ML Soln Commonly known as: Zyrtec TAKE 1.25 ML TWICE A DAY AS NEEDED FOR A RUNNY NOSE OR ITCH   Dexcom G7 Receiver Devi by Does not apply route.   Dexcom G7 Sensor Misc by Does not apply route.   Dialyvite 800 0.8 MG Tabs Take 1 tablet by mouth daily.   Fiasp 100 UNIT/ML Soln Generic drug: Insulin Aspart (w/Niacinamide) Inject 5-8 Units as directed with breakfast, with lunch, and with evening meal. Sliding Scale   gentamicin cream 0.1 % Commonly known as: GARAMYCIN Apply 1 Application topically  daily.   medroxyPROGESTERone 10 MG tablet Commonly known as: PROVERA Take provera 10 mg for 10 days every 3 months   midodrine 5 MG tablet Commonly known as: PROAMATINE Take 1 tablet (5 mg total) by mouth 3 (three) times daily with meals.   ondansetron 4 MG disintegrating tablet Commonly known as: ZOFRAN-ODT Take 1 tablet (4 mg total) by mouth every 8 (eight) hours as needed for nausea or vomiting.   pantoprazole 40 MG tablet Commonly known as: Protonix Take 1 tablet (40 mg total) by mouth 2 (two) times daily before a meal.   rosuvastatin 10 MG tablet Commonly known as: CRESTOR Take 1 tablet (10 mg total) by mouth daily.   SSD 1 % cream Generic drug: silver sulfADIAZINE APPLY CREAM TO WOUND DAILY AND COVER WITH GAUZE DRESSING   Tresiba FlexTouch 100 UNIT/ML FlexTouch Pen Generic drug: insulin degludec Inject 15 Units into the skin at bedtime.   Velphoro 500 MG chewable tablet Generic drug: sucroferric oxyhydroxide Chew 1,000 mg by mouth 3 (three) times daily with meals.   Ventolin HFA 108 (90 Base) MCG/ACT inhaler Generic drug: albuterol Inhale 2 puffs into the lungs every 4 (four) hours as needed for wheezing or shortness of breath. 2 puffs once every 4 hours as needed for cough, wheeze, shortness of breath and chest tightness. May use 2 puffs 5 to 15 minutes before activity to decrease cough or wheeze.  Vitamin D (Ergocalciferol) 1.25 MG (50000 UNIT) Caps capsule Commonly known as: DRISDOL Take 50,000 Units by mouth every 7 (seven) days.               Discharge Care Instructions  (From admission, onward)           Start     Ordered   07/13/23 0000  Discharge wound care:       Comments: Keep abdominal wound, clean , dry and covered   07/13/23 1007            Follow-up Information      [facility] Follow up in 2 week(s).   Specialty: Vascular Surgery Why: Office will call you to arrange your appt  (sent). Contact information: [address] [place] [address] [phone]        [facility]. Go on 07/14/2023.   Why: Schedule is Tuesday, Thursday, Saturday with 12:00 chair time.  Tomorrow, please arrive at 11:30 am. Contact information: [address] [place] [address] [phone]                No Known Allergies  Procedures/Studies: ECHO TEE Result Date: 07/09/2023    TRANSESOPHOGEAL ECHO REPORT   Patient Name:   SOMALIA SEGLER Date of Exam: 07/08/2023 Medical Rec #:  [id number]           Height:       68.0 in Accession #:    [id number]          Weight:       114.0 lb Date of Birth:  [birth year]           BSA:          1.609 m Patient Age:    27 years            BP:           112/72 mmHg Patient Gender: F                   HR:           79 bpm. Exam Location:  Inpatient Procedure: 2D Echo, Cardiac Doppler, Color Doppler and Saline Contrast Bubble            Study (Both Spectral and Color Flow Doppler were utilized during            procedure). Indications:     Septic Shock  History:         Patient has prior history of Echocardiogram examinations, most                  recent 07/06/2023. ESRD; Risk Factors:Hypertension, Diabetes and                  Dyslipidemia.  Sonographer:     Lucendia Herrlich RCS Referring Phys:  [id number] Ladona Ridgel A PARCELLS Diagnosing Phys: Weston Brass MD PROCEDURE: After discussion of the risks and benefits of a TEE, an informed consent was obtained from the patient. TEE procedure time was 22 minutes. The transesophogeal probe was passed without difficulty through the esophogus of the patient. Imaged were obtained with the patient in a left lateral decubitus position. Local oropharyngeal anesthetic was provided with Cetacaine. Sedation performed by different physician. The patient was monitored while under deep sedation. Anesthestetic sedation was provided intravenously by Anesthesiology: 261.56mg  of Propofol, 100mg  of  Lidocaine. Image quality was adequate. The patient's vital signs; including heart rate, blood pressure,  and oxygen saturation; remained stable throughout the procedure. The patient developed no complications during the procedure. Challenging mid esophageal views, patient repositioned with minimal improvement in LV imaging.  IMPRESSIONS  1. Prominent lateral and apical LV trabeculations. Left ventricular ejection fraction, by estimation, is 55 to 60%. The left ventricle has normal function.  2. Right ventricular systolic function is normal. The right ventricular size is normal.  3. No left atrial/left atrial appendage thrombus was detected. The LAA emptying velocity was 60 cm/s.  4. A small pericardial effusion is present.  5. The mitral valve is grossly normal. Trivial mitral valve regurgitation.  6. The aortic valve is tricuspid. Aortic valve regurgitation is not visualized. No aortic stenosis is present.  7. 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 Agitated saline contrast bubble study was positive with shunting observed within 3-6 cardiac cycles suggestive of interatrial shunt. Conclusion(s)/Recommendation(s): No evidence of vegetation/infective endocarditis on this transesophageael echocardiogram. FINDINGS  Left Ventricle: Prominent lateral and apical LV trabeculations. Left ventricular ejection fraction, by estimation, is 55 to 60%. The left ventricle has normal function. The left ventricular internal cavity size was normal in size. Right Ventricle: The right ventricular size is normal. No increase in right ventricular wall thickness. Right ventricular systolic function is normal. Left Atrium: Left atrial size was normal in size. No left atrial/left atrial appendage thrombus was detected. The LAA emptying velocity was 60 cm/s. Right Atrium: Right atrial size was normal in size. Pericardium: A small pericardial effusion is present. Mitral Valve: The mitral valve is grossly normal. Trivial mitral valve regurgitation.  There is no evidence of mitral valve vegetation. Tricuspid Valve: The tricuspid valve is normal in structure. Tricuspid valve regurgitation is trivial. There is no evidence of tricuspid valve vegetation. Aortic Valve: The aortic valve is tricuspid. Aortic valve regurgitation is not visualized. No aortic stenosis is present. There is no evidence of aortic valve vegetation. Pulmonic Valve: The pulmonic valve was normal in structure. Pulmonic valve regurgitation is trivial. There is no evidence of pulmonic valve vegetation. Aorta: The aortic root and ascending aorta are structurally normal, with no evidence of dilitation. 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 Agitated saline contrast was given intravenously to evaluate for intracardiac shunting. Agitated saline contrast bubble study was positive with shunting observed within 3-6 cardiac cycles suggestive of interatrial shunt. Additional Comments: Spectral Doppler performed. LEFT VENTRICLE PLAX 2D LVOT diam:     1.80 cm LV SV:         48 LV SV Index:   30 LVOT Area:     2.54 cm  AORTIC VALVE LVOT Vmax:   97.20 cm/s LVOT Vmean:  63.400 cm/s LVOT VTI:    0.189 m  AORTA Ao Root diam: 2.70 cm Ao Asc diam:  2.00 cm  SHUNTS Systemic VTI:  0.19 m Systemic Diam: 1.80 cm Weston Brass MD Electronically signed by Weston Brass MD Signature Date/Time: 07/09/2023/2:24:56 PM    Final    EP STUDY Result Date: 07/08/2023 See surgical note for result.  CT CHEST WO CONTRAST Result Date: 07/07/2023 CLINICAL DATA:  Dyspnea, chronic, unclear etiology Echo showing extrinsic compression of the left atrium-chest x-ray unrevealing-please assess with CT chest. EXAM: CT CHEST WITHOUT CONTRAST TECHNIQUE: Multidetector CT imaging of the chest was performed following the standard protocol without IV contrast. Evaluation of the heart, vascular structures, and soft tissues is limited without IV contrast. RADIATION DOSE REDUCTION: This exam was performed according to the  departmental dose-optimization program which  includes automated exposure control, adjustment of the mA and/or kV according to patient size and/or use of iterative reconstruction technique. COMPARISON:  07/04/2023, 10/27/2019 FINDINGS: Cardiovascular: Unenhanced imaging of the heart demonstrates cardiomegaly without pericardial effusion. Evaluation of the cardiac chambers is limited without IV contrast. Normal caliber of the thoracic aorta. Vascular lumen cannot be evaluated without IV contrast. Mediastinum/Nodes: No enlarged mediastinal or axillary lymph nodes. Thyroid gland, trachea, and esophagus demonstrate no significant findings. Lungs/Pleura: Trace left pleural effusion. Scattered areas of consolidation within the dependent left upper lobe and left lower lobe, unchanged since prior study and compatible with early airspace disease or atelectasis. Right chest is clear. No pneumothorax. Central airways are patent. Upper Abdomen: Trace fluid within the upper abdomen, decreased since prior study. Musculoskeletal: No acute or destructive bony lesions. IMPRESSION: 1. Cardiomegaly without pericardial effusion. Evaluation of the heart is limited without IV contrast. There is no abnormality identified on this unenhanced exam resulting in extrinsic compression of the left atrium as reported in the clinical history. 2. Stable patchy left basilar consolidation and small left effusion, favor atelectasis over airspace disease. 3. Trace upper abdominal free fluid. Electronically Signed   By: Sharlet Salina M.D.   On: 07/07/2023 16:19   ECHOCARDIOGRAM COMPLETE Result Date: 07/06/2023    ECHOCARDIOGRAM REPORT   Patient Name:   OLISA QUESNEL Date of Exam: 07/06/2023 Medical Rec #:  [id number]           Height:       68.0 in Accession #:    [id number]          Weight:       95.7 lb Date of Birth:  Oct 22, 1995           BSA:          1.494 m Patient Age:    27 years            BP:           92/60 mmHg Patient Gender: F                    HR:           97 bpm. Exam Location:  Inpatient Procedure: 2D Echo, Cardiac Doppler and Color Doppler (Both Spectral and Color            Flow Doppler were utilized during procedure). Indications:    Bacteremia  History:        Patient has prior history of Echocardiogram examinations, most                 recent 06/13/2021. Risk Factors:Diabetes and Hypertension.  Sonographer:    Amy Chionchio Referring Phys: [id number] NIKITA S DESAI IMPRESSIONS  1. Left ventricular ejection fraction, by estimation, is 50 to 55%. The left ventricle has low normal function. The left ventricle has no regional wall motion abnormalities. Left ventricular diastolic parameters are consistent with Grade II diastolic dysfunction (pseudonormalization).  2. Right ventricular systolic function is normal. The right ventricular size is normal. Tricuspid regurgitation signal is inadequate for assessing PA pressure.  3. There appears to be compression of the left atrium by structure extrinsic to the heart. Would review other chest imaging.  4. Right atrial size was mildly dilated.  5. The mitral valve is normal in structure. No evidence of mitral valve regurgitation. No evidence of mitral stenosis.  6. The aortic valve is tricuspid. Aortic valve regurgitation is not visualized. No aortic stenosis is present.  7.  In one view, there was concern for vegetation on pulmonary valve, but reviewing other images, I suspect this was artifact.  8. IVC not visualized. FINDINGS  Left Ventricle: Left ventricular ejection fraction, by estimation, is 50 to 55%. The left ventricle has low normal function. The left ventricle has no regional wall motion abnormalities. The left ventricular internal cavity size was normal in size. There is no left ventricular hypertrophy. Left ventricular diastolic parameters are consistent with Grade II diastolic dysfunction (pseudonormalization). Right Ventricle: The right ventricular size is normal. No increase in right  ventricular wall thickness. Right ventricular systolic function is normal. Tricuspid regurgitation signal is inadequate for assessing PA pressure. Left Atrium: There appears to be compression of the left atrium by structure extrinsic to the heart. Would review other chest imaging. Left atrial size was normal in size. Right Atrium: Right atrial size was mildly dilated. Pericardium: Trivial pericardial effusion is present. Mitral Valve: The mitral valve is normal in structure. No evidence of mitral valve regurgitation. No evidence of mitral valve stenosis. MV peak gradient, 4.7 mmHg. The mean mitral valve gradient is 2.0 mmHg. Tricuspid Valve: The tricuspid valve is normal in structure. Tricuspid valve regurgitation is not demonstrated. Aortic Valve: The aortic valve is tricuspid. Aortic valve regurgitation is not visualized. No aortic stenosis is present. Aortic valve mean gradient measures 3.0 mmHg. Aortic valve peak gradient measures 5.3 mmHg. Aortic valve area, by VTI measures 2.22 cm. Pulmonic Valve: The pulmonic valve was not well visualized. Pulmonic valve regurgitation is trivial. Aorta: The aortic root is normal in size and structure. Venous: IVC not visualized. IAS/Shunts: No atrial level shunt detected by color flow Doppler.  LEFT VENTRICLE PLAX 2D LVIDd:         3.60 cm   Diastology LVIDs:         2.90 cm   LV e' medial:    6.09 cm/s LV PW:         1.10 cm   LV E/e' medial:  10.9 LV IVS:        1.10 cm   LV e' lateral:   5.87 cm/s LVOT diam:     1.90 cm   LV E/e' lateral: 11.3 LV SV:         37 LV SV Index:   25 LVOT Area:     2.84 cm  RIGHT VENTRICLE RV Basal diam:  3.25 cm RV S prime:     12.10 cm/s TAPSE (M-mode): 1.6 cm LEFT ATRIUM            Index        RIGHT ATRIUM           Index LA Vol (A4C): 130.0 ml 87.02 ml/m  RA Area:     19.00 cm                                     RA Volume:   50.55 ml  33.84 ml/m  AORTIC VALVE                    PULMONIC VALVE AV Area (Vmax):    2.02 cm     PV Vmax:           0.98 m/s AV Area (Vmean):   1.92 cm     PV Peak grad:     3.8 mmHg AV Area (VTI):     2.22 cm  PR End Diast Vel: 5.11 msec AV Vmax:           115.00 cm/s AV Vmean:          81.900 cm/s AV VTI:            0.166 m AV Peak Grad:      5.3 mmHg AV Mean Grad:      3.0 mmHg LVOT Vmax:         82.00 cm/s LVOT Vmean:        55.400 cm/s LVOT VTI:          0.130 m LVOT/AV VTI ratio: 0.78  AORTA Ao Root diam: 2.80 cm Ao Asc diam:  2.30 cm MITRAL VALVE MV Area (PHT): 5.12 cm    SHUNTS MV Area VTI:   1.65 cm    Systemic VTI:  0.13 m MV Peak grad:  4.7 mmHg    Systemic Diam: 1.90 cm MV Mean grad:  2.0 mmHg MV Vmax:       1.08 m/s MV Vmean:      75.9 cm/s MV E velocity: 66.50 cm/s MV A velocity: 56.80 cm/s MV E/A ratio:  1.17 Dalton McleanMD Electronically signed by Wilfred Lacy Signature Date/Time: 07/06/2023/10:43:25 AM    Final    CT ABDOMEN PELVIS WO CONTRAST Result Date: 07/04/2023 CLINICAL DATA:  Acute nonlocalized abdominal pain after peritoneal dialysis. Nausea, vomiting, and diarrhea. EXAM: CT ABDOMEN AND PELVIS WITHOUT CONTRAST TECHNIQUE: Multidetector CT imaging of the abdomen and pelvis was performed following the standard protocol without IV contrast. RADIATION DOSE REDUCTION: This exam was performed according to the departmental dose-optimization program which includes automated exposure control, adjustment of the mA and/or kV according to patient size and/or use of iterative reconstruction technique. COMPARISON:  12/01/2022 FINDINGS: Lower chest: Small left pleural effusion. Atelectasis or infiltration demonstrated in both lung bases. Cardiac enlargement. Hepatobiliary: No focal liver abnormality is seen. No gallstones, gallbladder wall thickening, or biliary dilatation. Pancreas: Diffuse pancreatic atrophy with scattered calcifications consistent with evidence of chronic pancreatitis. No acute inflammatory changes appreciated. Spleen: Normal in size without focal abnormality. Adrenals/Urinary  Tract: No adrenal gland nodules. Kidneys are symmetrical. No hydronephrosis or hydroureter. Bladder is decompressed but there appears to be bladder wall thickening. This could be due to under distention or cystitis. Stomach/Bowel: Stomach is fluid-filled without distention. Small bowel and colon are mostly decompressed. Decompression limits evaluation but there is evidence of diffuse wall thickening in the small bowel and colon. This may indicate enterocolitis or reactive bowel wall thickening. No pneumatosis. Vascular/Lymphatic: Aortic atherosclerosis. No enlarged abdominal or pelvic lymph nodes. Reproductive: Uterus and bilateral adnexa are unremarkable. Other: Moderate free fluid in the abdomen with small amount of free air likely related to peritoneal dialysis. A peritoneal dialysis catheter is present, coiled in the pelvis. No loculated collections are identified. Prominent lymph nodes in the groin regions bilaterally. Largest on the right measures 2.6 cm diameter is incompletely included. This is similar to the previous study. Differential diagnosis could include inflammatory or reactive nodes versus lymphoproliferative process. Musculoskeletal: No acute bony abnormalities. IMPRESSION: 1. Moderate free fluid throughout the abdomen and pelvis with small amount of free air likely related to peritoneal dialysis. 2. Peritoneal dialysis catheter appears in place. No loculated collections. 3. Small and large bowel are decompressed but there is suggestion of wall thickening, possibly enterocolitis. 4. Bladder wall thickening may be due to under distention or cystitis. 5. Lymphadenopathy in the groin regions is similar to prior study, nonspecific. 6. Aortic atherosclerosis.  7. Chronic pancreatitis. 8. Infiltrates or atelectasis in both lung bases, possibly pneumonia. Small left pleural effusion. Electronically Signed   By: Burman Nieves M.D.   On: 07/04/2023 20:50   DG Chest 2 View Result Date:  07/04/2023 CLINICAL DATA:  Suspected sepsis EXAM: CHEST - 2 VIEW COMPARISON:  Chest x-ray 01/14/2023 FINDINGS: There are minimal strandy and patchy opacities in the anterior inferior left lower lobe. There is no pleural effusion or pneumothorax. The cardiomediastinal silhouette is within normal limits. No acute fractures are seen. IMPRESSION: Minimal strandy and patchy opacities in the anterior inferior left lower lobe, atelectasis versus pneumonia. Electronically Signed   By: Darliss Cheney M.D.   On: 07/04/2023 17:04   (Echo, Carotid, EGD, Colonoscopy, ERCP)    Subjective: Patient seen and examined.  Denies any complaints.  She tells me that her diarrhea is settled down.  No abdominal pain.  She had hemodialysis overnight.  Eager to get out of the hospital.   Discharge Exam: Vitals:   07/13/23 0400 07/13/23 0800  BP: 108/73 126/83  Pulse: 94 99  Resp: 18 15  Temp: 97.8 F (36.6 C) 98.4 F (36.9 C)  SpO2: 99% 98%   Vitals:   07/13/23 0021 07/13/23 0400 07/13/23 0500 07/13/23 0800  BP: 115/77 108/73  126/83  Pulse: 97 94  99  Resp: 19 18  15   Temp: 98 F (36.7 C) 97.8 F (36.6 C)  98.4 F (36.9 C)  TempSrc: Oral Oral  Oral  SpO2: 100% 99%  98%  Weight:   53.2 kg   Height:        General: Pt is alert, awake, not in acute distress Cardiovascular: RRR, S1/S2 +, no rubs, no gallops Respiratory: CTA bilaterally, no wheezing, no rhonchi Abdominal: Soft, mildly tender along the incision site.  No rigidity or guarding.  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    The results of significant diagnostics from this hospitalization (including imaging, microbiology, ancillary and laboratory) are listed below for reference.     Microbiology: Recent Results (from the past 240 hours)  Culture, blood (Routine x 2)     Status: Abnormal   Collection Time: 07/04/23  3:42 PM   Specimen: BLOOD  Result Value Ref Range Status    Specimen Description BLOOD SITE NOT SPECIFIED  Final   Special Requests   Final    BOTTLES DRAWN AEROBIC AND ANAEROBIC Blood Culture results may not be optimal due to an inadequate volume of blood received in culture bottles   Culture  Setup Time   Final    GRAM POSITIVE COCCI IN CLUSTERS AEROBIC BOTTLE ONLY CRITICAL RESULT CALLED TO, READ BACK BY AND VERIFIED WITH: PHARMD ADaphane Shepherd [phone] @ 1604 FH Performed at [facility], [address], [place], [place] [address]    Culture STAPHYLOCOCCUS AUREUS (A)  Final   Report Status 07/07/2023 FINAL  Final   Organism ID, Bacteria STAPHYLOCOCCUS AUREUS  Final      Susceptibility   Staphylococcus aureus - MIC*    CIPROFLOXACIN <=0.5 SENSITIVE Sensitive     ERYTHROMYCIN <=0.25 SENSITIVE Sensitive     GENTAMICIN <=0.5 SENSITIVE Sensitive     OXACILLIN <=0.25 SENSITIVE Sensitive     TETRACYCLINE <=1 SENSITIVE Sensitive     VANCOMYCIN 1 SENSITIVE Sensitive     TRIMETH/SULFA <=10 SENSITIVE Sensitive     CLINDAMYCIN <=0.25 SENSITIVE Sensitive     RIFAMPIN <=0.5 SENSITIVE Sensitive  Inducible Clindamycin NEGATIVE Sensitive     LINEZOLID 2 SENSITIVE Sensitive     * STAPHYLOCOCCUS AUREUS  Blood Culture ID Panel (Reflexed)     Status: Abnormal   Collection Time: 07/04/23  3:42 PM  Result Value Ref Range Status   Enterococcus faecalis NOT DETECTED NOT DETECTED Final   Enterococcus Faecium NOT DETECTED NOT DETECTED Final   Listeria monocytogenes NOT DETECTED NOT DETECTED Final   Staphylococcus species DETECTED (A) NOT DETECTED Final    Comment: CRITICAL RESULT CALLED TO, READ BACK BY AND VERIFIED WITH: PHARMD A. MEYER [id number] @ 1604 FH    Staphylococcus aureus (BCID) DETECTED (A) NOT DETECTED Final    Comment: CRITICAL RESULT CALLED TO, READ BACK BY AND VERIFIED WITH: PHARMD A. MEYER [id number] @ 1604 FH    Staphylococcus epidermidis NOT DETECTED NOT DETECTED Final   Staphylococcus lugdunensis NOT DETECTED NOT DETECTED Final   Streptococcus  species NOT DETECTED NOT DETECTED Final   Streptococcus agalactiae NOT DETECTED NOT DETECTED Final   Streptococcus pneumoniae NOT DETECTED NOT DETECTED Final   Streptococcus pyogenes NOT DETECTED NOT DETECTED Final   A.calcoaceticus-baumannii NOT DETECTED NOT DETECTED Final   Bacteroides fragilis NOT DETECTED NOT DETECTED Final   Enterobacterales NOT DETECTED NOT DETECTED Final   Enterobacter cloacae complex NOT DETECTED NOT DETECTED Final   Escherichia coli NOT DETECTED NOT DETECTED Final   Klebsiella aerogenes NOT DETECTED NOT DETECTED Final   Klebsiella oxytoca NOT DETECTED NOT DETECTED Final   Klebsiella pneumoniae NOT DETECTED NOT DETECTED Final   Proteus species NOT DETECTED NOT DETECTED Final   Salmonella species NOT DETECTED NOT DETECTED Final   Serratia marcescens NOT DETECTED NOT DETECTED Final   Haemophilus influenzae NOT DETECTED NOT DETECTED Final   Neisseria meningitidis NOT DETECTED NOT DETECTED Final   Pseudomonas aeruginosa NOT DETECTED NOT DETECTED Final   Stenotrophomonas maltophilia NOT DETECTED NOT DETECTED Final   Candida albicans NOT DETECTED NOT DETECTED Final   Candida auris NOT DETECTED NOT DETECTED Final   Candida glabrata NOT DETECTED NOT DETECTED Final   Candida krusei NOT DETECTED NOT DETECTED Final   Candida parapsilosis NOT DETECTED NOT DETECTED Final   Candida tropicalis NOT DETECTED NOT DETECTED Final   Cryptococcus neoformans/gattii NOT DETECTED NOT DETECTED Final   Meth resistant mecA/C and MREJ NOT DETECTED NOT DETECTED Final    Comment: Performed at [facility], [address] [place], [place] [address]  Resp panel by RT-PCR (RSV, Flu A&B, Covid) Anterior Nasal Swab     Status: None   Collection Time: 07/04/23  3:46 PM   Specimen: Anterior Nasal Swab  Result Value Ref Range Status   SARS Coronavirus 2 by RT PCR NEGATIVE NEGATIVE Final   Influenza A by PCR NEGATIVE NEGATIVE Final   Influenza B by PCR NEGATIVE NEGATIVE Final    Comment:  (NOTE) The Xpert Xpress SARS-CoV-2/FLU/RSV plus assay is intended as an aid in the diagnosis of influenza from Nasopharyngeal swab specimens and should not be used as a sole basis for treatment. Nasal washings and aspirates are unacceptable for Xpert Xpress SARS-CoV-2/FLU/RSV testing.  Fact Sheet for Patients: BloggerCourse.com  Fact Sheet for Healthcare Providers: SeriousBroker.it  This test is not yet approved or cleared by the [place] FDA and has been authorized for detection and/or diagnosis of SARS-CoV-2 by FDA under an Emergency Use Authorization (EUA). This EUA will remain in effect (meaning this test can be used) for the duration of the COVID-19 declaration under Section 564(b)(1) of the  Act, 21 U.S.C. section 360bbb-3(b)(1), unless the authorization is terminated or revoked.     Resp Syncytial Virus by PCR NEGATIVE NEGATIVE Final    Comment: (NOTE) Fact Sheet for Patients: BloggerCourse.com  Fact Sheet for Healthcare Providers: SeriousBroker.it  This test is not yet approved or cleared by the [place] FDA and has been authorized for detection and/or diagnosis of SARS-CoV-2 by FDA under an Emergency Use Authorization (EUA). This EUA will remain in effect (meaning this test can be used) for the duration of the COVID-19 declaration under Section 564(b)(1) of the Act, 21 U.S.C. section 360bbb-3(b)(1), unless the authorization is terminated or revoked.  Performed at [facility], [address], [place], [place] [address]   Body fluid culture w Gram Stain     Status: None   Collection Time: 07/04/23  7:34 PM   Specimen: Peritoneal Dialysate; Body Fluid  Result Value Ref Range Status   Specimen Description PERITONEAL DIALYSATE ABDOMEN  Final   Special Requests NONE  Final   Gram Stain   Final    ABUNDANT WBC PRESENT, PREDOMINANTLY PMN NO ORGANISMS  SEEN Performed at [facility], [address] [place], [place] [address]    Culture MODERATE STAPHYLOCOCCUS AUREUS  Final   Report Status 07/06/2023 FINAL  Final   Organism ID, Bacteria STAPHYLOCOCCUS AUREUS  Final      Susceptibility   Staphylococcus aureus - MIC*    CIPROFLOXACIN <=0.5 SENSITIVE Sensitive     ERYTHROMYCIN <=0.25 SENSITIVE Sensitive     GENTAMICIN <=0.5 SENSITIVE Sensitive     OXACILLIN <=0.25 SENSITIVE Sensitive     TETRACYCLINE <=1 SENSITIVE Sensitive     VANCOMYCIN 1 SENSITIVE Sensitive     TRIMETH/SULFA <=10 SENSITIVE Sensitive     CLINDAMYCIN <=0.25 SENSITIVE Sensitive     RIFAMPIN <=0.5 SENSITIVE Sensitive     Inducible Clindamycin NEGATIVE Sensitive     LINEZOLID 2 SENSITIVE Sensitive     * MODERATE STAPHYLOCOCCUS AUREUS  Culture, blood (Routine x 2)     Status: None   Collection Time: 07/04/23  9:08 PM   Specimen: BLOOD LEFT ARM  Result Value Ref Range Status   Specimen Description BLOOD LEFT ARM  Final   Special Requests   Final    BOTTLES DRAWN AEROBIC AND ANAEROBIC Blood Culture results may not be optimal due to an inadequate volume of blood received in culture bottles   Culture   Final    NO GROWTH 5 DAYS Performed at [facility], [address], [place], [place] [address]    Report Status 07/09/2023 FINAL  Final  MRSA Next Gen by PCR, Nasal     Status: None   Collection Time: 07/05/23 12:12 AM   Specimen: Nasal Mucosa; Nasal Swab  Result Value Ref Range Status   MRSA by PCR Next Gen NOT DETECTED NOT DETECTED Final    Comment: (NOTE) The GeneXpert MRSA Assay (FDA approved for NASAL specimens only), is one component of a comprehensive MRSA colonization surveillance program. It is not intended to diagnose MRSA infection nor to guide or monitor treatment for MRSA infections. Test performance is not FDA approved in patients less than 22 years old. Performed at [facility], [address], [place], [place] [address]   Body  fluid culture w Gram Stain     Status: None   Collection Time: 07/05/23  7:19 AM   Specimen: Peritoneal Washings; Body Fluid  Result Value Ref Range Status   Specimen  Description PERITONEAL  Final   Special Requests NONE  Final   Gram Stain   Final    ABUNDANT WBC PRESENT, PREDOMINANTLY PMN RARE GRAM POSITIVE COCCI Performed at [facility], [address] [place], [place] [address]    Culture FEW STAPHYLOCOCCUS AUREUS  Final   Report Status 07/07/2023 FINAL  Final   Organism ID, Bacteria STAPHYLOCOCCUS AUREUS  Final      Susceptibility   Staphylococcus aureus - MIC*    CIPROFLOXACIN <=0.5 SENSITIVE Sensitive     ERYTHROMYCIN <=0.25 SENSITIVE Sensitive     GENTAMICIN <=0.5 SENSITIVE Sensitive     OXACILLIN 0.5 SENSITIVE Sensitive     TETRACYCLINE <=1 SENSITIVE Sensitive     VANCOMYCIN 1 SENSITIVE Sensitive     TRIMETH/SULFA <=10 SENSITIVE Sensitive     CLINDAMYCIN <=0.25 SENSITIVE Sensitive     RIFAMPIN <=0.5 SENSITIVE Sensitive     Inducible Clindamycin NEGATIVE Sensitive     LINEZOLID 2 SENSITIVE Sensitive     * FEW STAPHYLOCOCCUS AUREUS  Culture, blood (Routine X 2) w Reflex to ID Panel     Status: None   Collection Time: 07/07/23  5:20 AM   Specimen: BLOOD LEFT HAND  Result Value Ref Range Status   Specimen Description BLOOD LEFT HAND  Final   Special Requests   Final    BOTTLES DRAWN AEROBIC ONLY Blood Culture results may not be optimal due to an inadequate volume of blood received in culture bottles   Culture   Final    NO GROWTH 5 DAYS Performed at [facility], [address] [place], [place] [address]    Report Status 07/12/2023 FINAL  Final  Culture, blood (Routine X 2) w Reflex to ID Panel     Status: None   Collection Time: 07/07/23  5:27 AM   Specimen: BLOOD LEFT ARM  Result Value Ref Range Status   Specimen Description BLOOD LEFT ARM  Final   Special Requests   Final    BOTTLES DRAWN AEROBIC AND ANAEROBIC Blood Culture adequate volume    Culture   Final    NO GROWTH 5 DAYS Performed at [facility], [address] [place], [place] [address]    Report Status 07/12/2023 FINAL  Final  Gastrointestinal Panel by PCR , Stool     Status: None   Collection Time: 07/10/23 11:22 AM   Specimen: Stool  Result Value Ref Range Status   Campylobacter species NOT DETECTED NOT DETECTED Final   Plesimonas shigelloides NOT DETECTED NOT DETECTED Final   Salmonella species NOT DETECTED NOT DETECTED Final   Yersinia enterocolitica NOT DETECTED NOT DETECTED Final   Vibrio species NOT DETECTED NOT DETECTED Final   Vibrio cholerae NOT DETECTED NOT DETECTED Final   Enteroaggregative E coli (EAEC) NOT DETECTED NOT DETECTED Final   Enteropathogenic E coli (EPEC) NOT DETECTED NOT DETECTED Final   Enterotoxigenic E coli (ETEC) NOT DETECTED NOT DETECTED Final   Shiga like toxin producing E coli (STEC) NOT DETECTED NOT DETECTED Final   Shigella/Enteroinvasive E coli (EIEC) NOT DETECTED NOT DETECTED Final   Cryptosporidium NOT DETECTED NOT DETECTED Final   Cyclospora cayetanensis NOT DETECTED NOT DETECTED Final   Entamoeba histolytica NOT DETECTED NOT DETECTED Final   Giardia lamblia NOT DETECTED NOT DETECTED Final   Adenovirus F40/41 NOT DETECTED NOT DETECTED Final   Astrovirus NOT DETECTED NOT DETECTED Final   Norovirus GI/GII NOT DETECTED NOT DETECTED Final   Rotavirus A NOT DETECTED NOT  DETECTED Final   Sapovirus (I, II, IV, and V) NOT DETECTED NOT DETECTED Final    Comment: Performed at [facility], [address] [place], [place] [address]  Body fluid culture w Gram Stain     Status: None (Preliminary result)   Collection Time: 07/11/23  1:25 AM   Specimen: Peritoneal Dialysate; Body Fluid  Result Value Ref Range Status   Specimen Description PERITONEAL DIALYSATE FLUID  Final   Special Requests NONE  Final   Gram Stain   Final    RARE WBC PRESENT, PREDOMINANTLY PMN NO ORGANISMS SEEN    Culture   Final    NO GROWTH 1  DAY Performed at [facility], [address] [place], [place] [address]    Report Status PENDING  Incomplete  C Difficile Quick Screen (NO PCR Reflex)     Status: None   Collection Time: 07/11/23 10:41 AM   Specimen: STOOL  Result Value Ref Range Status   C Diff antigen NEGATIVE NEGATIVE Final   C Diff toxin NEGATIVE NEGATIVE Final   C Diff interpretation No C. difficile detected.  Final    Comment: Performed at [facility], [name], [place], [place] [address]     Labs: BNP (last 3 results) No results for input(s): "BNP" in the last 8760 hours. Basic Metabolic Panel: Recent Labs  Lab 07/07/23 0520 07/08/23 2841 07/09/23 0602 07/10/23 0558 07/11/23 0443 07/12/23 0503 07/13/23 0532  NA 131*   < > 133* 135 134* 137 133*  K 3.3*   < > 3.0* 3.0* 3.2* 4.0 3.5  CL 100   < > 100 99 103 106 105  CO2 17*   < > 20* 21* 22 19* 25  GLUCOSE 201*   < > 202* 183* 197* 75 102*  BUN 33*   < > 27* 24* 24* 26* 13  CREATININE 7.91*   < > 7.37* 7.35* 7.03* 7.92* 5.03*  CALCIUM 7.2*   < > 6.9* 7.2* 6.9* 7.3* 7.3*  MG 1.9  --   --   --  1.7  --   --   PHOS 3.3   < > 2.9 2.5 2.4* 3.1 2.3*   < > = values in this interval not displayed.   Liver Function Tests: Recent Labs  Lab 07/09/23 0602 07/10/23 0558 07/11/23 0443 07/12/23 0503 07/13/23 0532  ALBUMIN <1.5* <1.5* <1.5* <1.5* <1.5*   No results for input(s): "LIPASE", "AMYLASE" in the last 168 hours. No results for input(s): "AMMONIA" in the last 168 hours. CBC: Recent Labs  Lab 07/08/23 0608 07/09/23 0602 07/10/23 0558 07/11/23 0443 07/13/23 0532  WBC 13.8* 14.7* 15.1* 13.4* 17.7*  NEUTROABS 11.4*  --   --   --   --   HGB 10.5* 9.7* 9.5* 9.6* 9.2*  HCT 30.7* 28.7* 28.1* 28.7* 27.3*  MCV 86.5 87.8 86.5 86.4 87.2  PLT 211 196 175 148* 138*   Cardiac Enzymes: No results for input(s): "CKTOTAL", "CKMB", "CKMBINDEX", "TROPONINI" in the last 168 hours. BNP: Invalid input(s): "POCBNP" CBG: Recent Labs   Lab 07/12/23 1051 07/12/23 1202 07/12/23 1714 07/12/23 2000 07/13/23 0814  GLUCAP 81 73 102* 115* 71   D-Dimer No results for input(s): "DDIMER" in the last 72 hours. Hgb A1c No results for input(s): "HGBA1C" in the last 72 hours. Lipid Profile No results for input(s): "CHOL", "HDL", "LDLCALC", "TRIG", "CHOLHDL", "LDLDIRECT" in the last 72 hours. Thyroid function studies No results for input(s): "TSH", "T4TOTAL", "T3FREE", "  THYROIDAB" in the last 72 hours.  Invalid input(s): "FREET3" Anemia work up No results for input(s): "VITAMINB12", "FOLATE", "FERRITIN", "TIBC", "IRON", "RETICCTPCT" in the last 72 hours. Urinalysis    Component Value Date/Time   COLORURINE YELLOW 10/07/2021 1325   APPEARANCEUR TURBID (A) 10/07/2021 1325   LABSPEC 1.011 10/07/2021 1325   PHURINE 5.0 10/07/2021 1325   GLUCOSEU 50 (A) 10/07/2021 1325   HGBUR SMALL (A) 10/07/2021 1325   BILIRUBINUR NEGATIVE 10/07/2021 1325   KETONESUR NEGATIVE 10/07/2021 1325   PROTEINUR 100 (A) 10/07/2021 1325   UROBILINOGEN 0.2 01/07/2015 1723   NITRITE NEGATIVE 10/07/2021 1325   LEUKOCYTESUR LARGE (A) 10/07/2021 1325   Sepsis Labs Recent Labs  Lab 07/09/23 0602 07/10/23 0558 07/11/23 0443 07/13/23 0532  WBC 14.7* 15.1* 13.4* 17.7*   Microbiology Recent Results (from the past 240 hours)  Culture, blood (Routine x 2)     Status: Abnormal   Collection Time: 07/04/23  3:42 PM   Specimen: BLOOD  Result Value Ref Range Status   Specimen Description BLOOD SITE NOT SPECIFIED  Final   Special Requests   Final    BOTTLES DRAWN AEROBIC AND ANAEROBIC Blood Culture results may not be optimal due to an inadequate volume of blood received in culture bottles   Culture  Setup Time   Final    GRAM POSITIVE COCCI IN CLUSTERS AEROBIC BOTTLE ONLY CRITICAL RESULT CALLED TO, READ BACK BY AND VERIFIED WITH: PHARMD ADaphane Shepherd [id number] @ 1604 FH Performed at [facility], [address], [place], [place] [address]    Culture  STAPHYLOCOCCUS AUREUS (A)  Final   Report Status 07/07/2023 FINAL  Final   Organism ID, Bacteria STAPHYLOCOCCUS AUREUS  Final      Susceptibility   Staphylococcus aureus - MIC*    CIPROFLOXACIN <=0.5 SENSITIVE Sensitive     ERYTHROMYCIN <=0.25 SENSITIVE Sensitive     GENTAMICIN <=0.5 SENSITIVE Sensitive     OXACILLIN <=0.25 SENSITIVE Sensitive     TETRACYCLINE <=1 SENSITIVE Sensitive     VANCOMYCIN 1 SENSITIVE Sensitive     TRIMETH/SULFA <=10 SENSITIVE Sensitive     CLINDAMYCIN <=0.25 SENSITIVE Sensitive     RIFAMPIN <=0.5 SENSITIVE Sensitive     Inducible Clindamycin NEGATIVE Sensitive     LINEZOLID 2 SENSITIVE Sensitive     * STAPHYLOCOCCUS AUREUS  Blood Culture ID Panel (Reflexed)     Status: Abnormal   Collection Time: 07/04/23  3:42 PM  Result Value Ref Range Status   Enterococcus faecalis NOT DETECTED NOT DETECTED Final   Enterococcus Faecium NOT DETECTED NOT DETECTED Final   Listeria monocytogenes NOT DETECTED NOT DETECTED Final   Staphylococcus species DETECTED (A) NOT DETECTED Final    Comment: CRITICAL RESULT CALLED TO, READ BACK BY AND VERIFIED WITH: PHARMD A. MEYER [phone] @ 1604 FH    Staphylococcus aureus (BCID) DETECTED (A) NOT DETECTED Final    Comment: CRITICAL RESULT CALLED TO, READ BACK BY AND VERIFIED WITH: PHARMD A. MEYER [id number] @ 1604 FH    Staphylococcus epidermidis NOT DETECTED NOT DETECTED Final   Staphylococcus lugdunensis NOT DETECTED NOT DETECTED Final   Streptococcus species NOT DETECTED NOT DETECTED Final   Streptococcus agalactiae NOT DETECTED NOT DETECTED Final   Streptococcus pneumoniae NOT DETECTED NOT DETECTED Final   Streptococcus pyogenes NOT DETECTED NOT DETECTED Final   A.calcoaceticus-baumannii NOT DETECTED NOT DETECTED Final   Bacteroides fragilis NOT DETECTED NOT DETECTED Final   Enterobacterales NOT DETECTED NOT DETECTED Final   Enterobacter cloacae complex  NOT DETECTED NOT DETECTED Final   Escherichia coli NOT DETECTED NOT DETECTED  Final   Klebsiella aerogenes NOT DETECTED NOT DETECTED Final   Klebsiella oxytoca NOT DETECTED NOT DETECTED Final   Klebsiella pneumoniae NOT DETECTED NOT DETECTED Final   Proteus species NOT DETECTED NOT DETECTED Final   Salmonella species NOT DETECTED NOT DETECTED Final   Serratia marcescens NOT DETECTED NOT DETECTED Final   Haemophilus influenzae NOT DETECTED NOT DETECTED Final   Neisseria meningitidis NOT DETECTED NOT DETECTED Final   Pseudomonas aeruginosa NOT DETECTED NOT DETECTED Final   Stenotrophomonas maltophilia NOT DETECTED NOT DETECTED Final   Candida albicans NOT DETECTED NOT DETECTED Final   Candida auris NOT DETECTED NOT DETECTED Final   Candida glabrata NOT DETECTED NOT DETECTED Final   Candida krusei NOT DETECTED NOT DETECTED Final   Candida parapsilosis NOT DETECTED NOT DETECTED Final   Candida tropicalis NOT DETECTED NOT DETECTED Final   Cryptococcus neoformans/gattii NOT DETECTED NOT DETECTED Final   Meth resistant mecA/C and MREJ NOT DETECTED NOT DETECTED Final    Comment: Performed at [facility], [address], [place], [place] [address]  Resp panel by RT-PCR (RSV, Flu A&B, Covid) Anterior Nasal Swab     Status: None   Collection Time: 07/04/23  3:46 PM   Specimen: Anterior Nasal Swab  Result Value Ref Range Status   SARS Coronavirus 2 by RT PCR NEGATIVE NEGATIVE Final   Influenza A by PCR NEGATIVE NEGATIVE Final   Influenza B by PCR NEGATIVE NEGATIVE Final    Comment: (NOTE) The Xpert Xpress SARS-CoV-2/FLU/RSV plus assay is intended as an aid in the diagnosis of influenza from Nasopharyngeal swab specimens and should not be used as a sole basis for treatment. Nasal washings and aspirates are unacceptable for Xpert Xpress SARS-CoV-2/FLU/RSV testing.  Fact Sheet for Patients: BloggerCourse.com  Fact Sheet for Healthcare Providers: SeriousBroker.it  This test is not yet approved or cleared by the  [place] FDA and has been authorized for detection and/or diagnosis of SARS-CoV-2 by FDA under an Emergency Use Authorization (EUA). This EUA will remain in effect (meaning this test can be used) for the duration of the COVID-19 declaration under Section 564(b)(1) of the Act, 21 U.S.C. section 360bbb-3(b)(1), unless the authorization is terminated or revoked.     Resp Syncytial Virus by PCR NEGATIVE NEGATIVE Final    Comment: (NOTE) Fact Sheet for Patients: BloggerCourse.com  Fact Sheet for Healthcare Providers: SeriousBroker.it  This test is not yet approved or cleared by the [place] FDA and has been authorized for detection and/or diagnosis of SARS-CoV-2 by FDA under an Emergency Use Authorization (EUA). This EUA will remain in effect (meaning this test can be used) for the duration of the COVID-19 declaration under Section 564(b)(1) of the Act, 21 U.S.C. section 360bbb-3(b)(1), unless the authorization is terminated or revoked.  Performed at [facility], [address], [place], [place] [address]   Body fluid culture w Gram Stain     Status: None   Collection Time: 07/04/23  7:34 PM   Specimen: Peritoneal Dialysate; Body Fluid  Result Value Ref Range Status   Specimen Description PERITONEAL DIALYSATE ABDOMEN  Final   Special Requests NONE  Final   Gram Stain   Final    ABUNDANT WBC PRESENT, PREDOMINANTLY PMN NO ORGANISMS SEEN Performed at [facility], [address], [place], [place] [address]    Culture MODERATE STAPHYLOCOCCUS AUREUS  Final   Report Status 07/06/2023  FINAL  Final   Organism ID, Bacteria STAPHYLOCOCCUS AUREUS  Final      Susceptibility   Staphylococcus aureus - MIC*    CIPROFLOXACIN <=0.5 SENSITIVE Sensitive     ERYTHROMYCIN <=0.25 SENSITIVE Sensitive     GENTAMICIN <=0.5 SENSITIVE Sensitive     OXACILLIN <=0.25 SENSITIVE Sensitive     TETRACYCLINE <=1 SENSITIVE Sensitive      VANCOMYCIN 1 SENSITIVE Sensitive     TRIMETH/SULFA <=10 SENSITIVE Sensitive     CLINDAMYCIN <=0.25 SENSITIVE Sensitive     RIFAMPIN <=0.5 SENSITIVE Sensitive     Inducible Clindamycin NEGATIVE Sensitive     LINEZOLID 2 SENSITIVE Sensitive     * MODERATE STAPHYLOCOCCUS AUREUS  Culture, blood (Routine x 2)     Status: None   Collection Time: 07/04/23  9:08 PM   Specimen: BLOOD LEFT ARM  Result Value Ref Range Status   Specimen Description BLOOD LEFT ARM  Final   Special Requests   Final    BOTTLES DRAWN AEROBIC AND ANAEROBIC Blood Culture results may not be optimal due to an inadequate volume of blood received in culture bottles   Culture   Final    NO GROWTH 5 DAYS Performed at [facility], [address], [place], [place] [address]    Report Status 07/09/2023 FINAL  Final  MRSA Next Gen by PCR, Nasal     Status: None   Collection Time: 07/05/23 12:12 AM   Specimen: Nasal Mucosa; Nasal Swab  Result Value Ref Range Status   MRSA by PCR Next Gen NOT DETECTED NOT DETECTED Final    Comment: (NOTE) The GeneXpert MRSA Assay (FDA approved for NASAL specimens only), is one component of a comprehensive MRSA colonization surveillance program. It is not intended to diagnose MRSA infection nor to guide or monitor treatment for MRSA infections. Test performance is not FDA approved in patients less than 29 years old. Performed at [facility], [address], [place], [place] [address]   Body fluid culture w Gram Stain     Status: None   Collection Time: 07/05/23  7:19 AM   Specimen: Peritoneal Washings; Body Fluid  Result Value Ref Range Status   Specimen Description PERITONEAL  Final   Special Requests NONE  Final   Gram Stain   Final    ABUNDANT WBC PRESENT, PREDOMINANTLY PMN RARE GRAM POSITIVE COCCI Performed at [facility], [address], [place], [place] [address]    Culture FEW STAPHYLOCOCCUS AUREUS  Final   Report Status 07/07/2023 FINAL  Final   Organism  ID, Bacteria STAPHYLOCOCCUS AUREUS  Final      Susceptibility   Staphylococcus aureus - MIC*    CIPROFLOXACIN <=0.5 SENSITIVE Sensitive     ERYTHROMYCIN <=0.25 SENSITIVE Sensitive     GENTAMICIN <=0.5 SENSITIVE Sensitive     OXACILLIN 0.5 SENSITIVE Sensitive     TETRACYCLINE <=1 SENSITIVE Sensitive     VANCOMYCIN 1 SENSITIVE Sensitive     TRIMETH/SULFA <=10 SENSITIVE Sensitive     CLINDAMYCIN <=0.25 SENSITIVE Sensitive     RIFAMPIN <=0.5 SENSITIVE Sensitive     Inducible Clindamycin NEGATIVE Sensitive     LINEZOLID 2 SENSITIVE Sensitive     * FEW STAPHYLOCOCCUS AUREUS  Culture, blood (Routine X 2) w Reflex to ID Panel     Status: None   Collection Time: 07/07/23  5:20 AM   Specimen: BLOOD LEFT HAND  Result Value Ref Range Status   Specimen Description BLOOD LEFT  HAND  Final   Special Requests   Final    BOTTLES DRAWN AEROBIC ONLY Blood Culture results may not be optimal due to an inadequate volume of blood received in culture bottles   Culture   Final    NO GROWTH 5 DAYS Performed at [facility], [address], [place], [place] [address]    Report Status 07/12/2023 FINAL  Final  Culture, blood (Routine X 2) w Reflex to ID Panel     Status: None   Collection Time: 07/07/23  5:27 AM   Specimen: BLOOD LEFT ARM  Result Value Ref Range Status   Specimen Description BLOOD LEFT ARM  Final   Special Requests   Final    BOTTLES DRAWN AEROBIC AND ANAEROBIC Blood Culture adequate volume   Culture   Final    NO GROWTH 5 DAYS Performed at [facility], [address], [place], [place] [address]    Report Status 07/12/2023 FINAL  Final  Gastrointestinal Panel by PCR , Stool     Status: None   Collection Time: 07/10/23 11:22 AM   Specimen: Stool  Result Value Ref Range Status   Campylobacter species NOT DETECTED NOT DETECTED Final   Plesimonas shigelloides NOT DETECTED NOT DETECTED Final   Salmonella species NOT DETECTED NOT DETECTED Final   Yersinia enterocolitica NOT  DETECTED NOT DETECTED Final   Vibrio species NOT DETECTED NOT DETECTED Final   Vibrio cholerae NOT DETECTED NOT DETECTED Final   Enteroaggregative E coli (EAEC) NOT DETECTED NOT DETECTED Final   Enteropathogenic E coli (EPEC) NOT DETECTED NOT DETECTED Final   Enterotoxigenic E coli (ETEC) NOT DETECTED NOT DETECTED Final   Shiga like toxin producing E coli (STEC) NOT DETECTED NOT DETECTED Final   Shigella/Enteroinvasive E coli (EIEC) NOT DETECTED NOT DETECTED Final   Cryptosporidium NOT DETECTED NOT DETECTED Final   Cyclospora cayetanensis NOT DETECTED NOT DETECTED Final   Entamoeba histolytica NOT DETECTED NOT DETECTED Final   Giardia lamblia NOT DETECTED NOT DETECTED Final   Adenovirus F40/41 NOT DETECTED NOT DETECTED Final   Astrovirus NOT DETECTED NOT DETECTED Final   Norovirus GI/GII NOT DETECTED NOT DETECTED Final   Rotavirus A NOT DETECTED NOT DETECTED Final   Sapovirus (I, II, IV, and V) NOT DETECTED NOT DETECTED Final    Comment: Performed at [facility], [address] [place], [place] [address]  Body fluid culture w Gram Stain     Status: None (Preliminary result)   Collection Time: 07/11/23  1:25 AM   Specimen: Peritoneal Dialysate; Body Fluid  Result Value Ref Range Status   Specimen Description PERITONEAL DIALYSATE FLUID  Final   Special Requests NONE  Final   Gram Stain   Final    RARE WBC PRESENT, PREDOMINANTLY PMN NO ORGANISMS SEEN    Culture   Final    NO GROWTH 1 DAY Performed at [facility], [address] [place], [place] [address]    Report Status PENDING  Incomplete  C Difficile Quick Screen (NO PCR Reflex)     Status: None   Collection Time: 07/11/23 10:41 AM   Specimen: STOOL  Result Value Ref Range Status   C Diff antigen NEGATIVE NEGATIVE Final   C Diff toxin NEGATIVE NEGATIVE Final   C Diff interpretation No C. difficile detected.  Final    Comment: Performed at [facility], [address] [place], [place] [address]      Time coordinating discharge:  40 minutes  SIGNED:   Dorcas Carrow, MD  [facility] 07/13/2023, 11:00 AM

## 2023-07-13 NOTE — Progress Notes (Addendum)
 Pt has been provided a chair time at [facility] for in-center HD. Pt's schedule will be TTS 12:00 chair time. Pt can start tomorrow and will need to arrive at 11:30 am. Spoke to pt via phone. Pt advised of schedule and agreeable. Arrangements added to AVS as well. Clinic confirms they have iv cefazolin as well. Update provided to attending, nephrologist, renal PA, pt's RN, and RN CM. Pt hoping for d/c today.   Olivia Canter Renal Navigator [phone]  Addendum at 1:49 pm: Contacted [facility] to confirm pt was d/c today and should start tomorrow. Renal PA to send orders to clinic for iv abx with HD.

## 2023-07-13 NOTE — Progress Notes (Signed)
   07/13/23 0000  Vitals  Temp 98.6 F (37 C)  Temp Source Oral  BP 118/80  MAP (mmHg) 92  BP Location Left Arm  BP Method Automatic  Patient Position (if appropriate) Lying  Pulse Rate 97  Pulse Rate Source Monitor  ECG Heart Rate 98  Resp (!) 0  Oxygen Therapy  SpO2 100 %  O2 Device Room Air  Patient Activity (if Appropriate) In bed  Pulse Oximetry Type Continuous  During Treatment Monitoring  Blood Flow Rate (mL/min) 0 mL/min  Arterial Pressure (mmHg) -2.63 mmHg  Venous Pressure (mmHg) -2.22 mmHg  TMP (mmHg) -50.3 mmHg  Ultrafiltration Rate (mL/min) 918 mL/min  Dialysate Flow Rate (mL/min) 299 ml/min  Dialysate Potassium Concentration 3  Dialysate Calcium Concentration 2.5  Duration of HD Treatment -hour(s) 3 hour(s)  Cumulative Fluid Removed (mL) per Treatment  2000.13  HD Safety Checks Performed Yes  Intra-Hemodialysis Comments Tx completed  Post Treatment  Dialyzer Clearance Lightly streaked  Liters Processed 54  Fluid Removed (mL) 2000 mL  Tolerated HD Treatment Yes  Post-Hemodialysis Comments  (Pt tolerated treatment without complications)  AVG/AVF Arterial Site Held (minutes) 8 minutes  AVG/AVF Venous Site Held (minutes) 8 minutes  Fistula / Graft Right Upper arm Arteriovenous fistula  Placement Date/Time: 12/08/21 1401   Placed prior to admission: No  Orientation: Right  Access Location: Upper arm  Access Type: (c) Arteriovenous fistula  Site Condition No complications  Fistula / Graft Assessment Present;Thrill;Bruit  Status Deaccessed  Needle Size 15  Drainage Description None   Pt tolerated treatment without complications.

## 2023-07-13 NOTE — Progress Notes (Signed)
 [facility] Progress Note  Name: Lindsey Murillo MRN: [id number] DOB: 08-25-1995  Chief Complaint:  Abdominal pain  Subjective:  Seen and examined at bedside. Completed dialysis yesterday - no issues with HD. Ready to go home.   Background on consult:  Lindsey Murillo is a 28 y.o. female with a PMH significant for type 1 DM,  HTN, autoimmune thyroiditis, and ESRD on CCPD who presented to [facility] ED today after developing abdominal pain this morning.  She has not been feeling well and has had N/V/D for the past 4-5 days as well as a cough.  She denies any cloudy PD fluid and reports that her abdominal pain just started today.  In the ED, temp max 100.7, bp 82/46, HR 110, SpO2 93% on room air.  Labs notable for WBC 10, Hgb 10.5, plt 244, Na 134, K 2.6, Co2 17, BUN 36, Cr 10.6, Ca 7.8, alb 1.7.  Presentation concerning for SBP and we were consulted to manage her PD and send off PD fluid for studies during her hospitalization.  Of note, she was not doing her PD correctly for the past week and was 10 kg above edw.  She was not doing the proper amount of exchanges and was reabsorbing.  She declined coming in for HD session to help with her volume, however she declined and performed CAPD at home.  She now appears hypovolemic due to vomiting and diarrhea which predated her abdominal pain and may have a viral gastroenteritis.  Outpatient PD RN reports pt on 6 exchanges over night with 2.4 fills.  Dwell time 1.5 hours.  EDW is 50 kg. She was just changed from CAPD to CCPD.  Mircera 50 mcg on 2/19 and receives this every two weeks usually or her hemoglobin will drop    Intake/Output Summary (Last 24 hours) at 07/13/2023 1126 Last data filed at 07/13/2023 0000 Gross per 24 hour  Intake 360 ml  Output 2000 ml  Net -1640 ml    Vitals:  Vitals:   07/13/23 0021 07/13/23 0400 07/13/23 0500 07/13/23 0800  BP: 115/77 108/73  126/83  Pulse: 97 94  99  Resp: 19 18  15   Temp: 98 F (36.7 C)  97.8 F (36.6 C)  98.4 F (36.9 C)  TempSrc: Oral Oral  Oral  SpO2: 100% 99%  98%  Weight:   53.2 kg   Height:         Physical Exam:     General adult female in bed in no acute distress Lungs clear  Heart S1S2 no rub Abdomen soft slightly tender to palpation; Extremities 1+ edema lower extremities with legs dependent  Psych normal mood and affect Neuro alert and oriented x 3 provides hx follows commands Access: RUE AVF with bruit and thrill   Medications reviewed   Labs:     Latest Ref Rng & Units 07/13/2023    5:32 AM 07/12/2023    5:03 AM 07/11/2023    4:43 AM  BMP  Glucose 70 - 99 mg/dL 409  75  811   BUN 6 - 20 mg/dL 13  26  24    Creatinine 0.44 - 1.00 mg/dL 9.14  7.82  9.56   Sodium 135 - 145 mmol/L 133  137  134   Potassium 3.5 - 5.1 mmol/L 3.5  4.0  3.2   Chloride 98 - 111 mmol/L 105  106  103   CO2 22 - 32 mmol/L 25  19  22    Calcium  8.9 - 10.3 mg/dL 7.3  7.3  6.9      Assessment/Plan:   PD-associated Bacterial peritonitis -  Moderate staph aureus on PD fluid culture On cefazolin  Cell count trend variable.  Clinically improving ID recommended removal of PD cath - removed 07/12/23 per [facility].  Continue cefazolin 2g IV q HD x 4 weeks  ESRD -   Transition back to IHD.  R AVF  in place.  Has chair at [facility] TTS - resume HD as outpatient.  Staph aureus bacteremia - on cefazolin.  As above. Continue cefazolin x 4 weeks.  Type 1 DM - per primary team Hypertension/volume  - Midodrine was started here, but BP creeping up. LE edema on exam and well above out patient dry weight. Attempt 2L UF today  Anemia of CKD - trend Hb.  She got mircera 50 mcg on 2/19 and receives this every two weeks usually or her hemoglobin will drop  Ordered aranesp 100 mcg every Wednesday for 07/13/23 Metabolic bone disease -  phos controlled. On calcitriol.  Have stopped sensipar for now due to hypocalcemia Hypokalemia - likely from GI losses. Improved. Resuming IHD - will stop KCl Diarrhea -  work-up and supportive care per primary team.  They are starting immodium    Tomasa Blase, PA-C 07/13/2023 11:26 AM

## 2023-07-13 NOTE — Plan of Care (Signed)
  Problem: Clinical Measurements: Goal: Ability to maintain clinical measurements within normal limits will improve Outcome: Progressing Goal: Will remain free from infection Outcome: Progressing Goal: Diagnostic test results will improve Outcome: Progressing   Problem: Coping: Goal: Level of anxiety will decrease Outcome: Progressing   Problem: Elimination: Goal: Will not experience complications related to bowel motility Outcome: Progressing   Problem: Skin Integrity: Goal: Risk for impaired skin integrity will decrease Outcome: Progressing   Problem: Fluid Volume: Goal: Ability to maintain a balanced intake and output will improve Outcome: Progressing   Problem: Metabolic: Goal: Ability to maintain appropriate glucose levels will improve Outcome: Progressing

## 2023-07-13 NOTE — Plan of Care (Signed)

## 2023-07-13 NOTE — Progress Notes (Signed)
 [facility]  Subjective  - Abdominal pain improved   Objective 126/83 99 97.8 F (36.6 C) (Oral) 15 98%  Intake/Output Summary (Last 24 hours) at 07/13/2023 1914 Last data filed at 07/13/2023 0000 Gross per 24 hour  Intake 710 ml  Output 2005 ml  Net -1295 ml    Abdomin grossly soft, Non distended, tender near incisions Right UE av fistula with thrill Lungs non labored breathing   Assessment/Planning: POD # 1 removal of infected PD cath She has a working right UE AV fistula  peritoneal fluid culture: MSSA IV Ancef F/U sent by Dr. Karin Lieu, stable per vascular point of view  Mosetta Pigeon 07/13/2023 8:06 AM --  Laboratory Lab Results: Recent Labs    07/11/23 0443 07/13/23 0532  WBC 13.4* 17.7*  HGB 9.6* 9.2*  HCT 28.7* 27.3*  PLT 148* 138*   BMET Recent Labs    07/12/23 0503 07/13/23 0532  NA 137 133*  K 4.0 3.5  CL 106 105  CO2 19* 25  GLUCOSE 75 102*  BUN 26* 13  CREATININE 7.92* 5.03*  CALCIUM 7.3* 7.3*    COAG Lab Results  Component Value Date   INR 1.4 (H) 07/04/2023   INR 1.4 (H) 10/03/2021   INR 1.2 10/01/2021   No results found for: "PTT"

## 2023-07-13 NOTE — Anesthesia Postprocedure Evaluation (Signed)
 Anesthesia Post Note  Patient: Lindsey Murillo  Procedure(s) Performed: REMOVAL, CATHETER, CAPD     Patient location during evaluation: PACU Anesthesia Type: General Level of consciousness: awake and alert Pain management: pain level controlled Vital Signs Assessment: post-procedure vital signs reviewed and stable Respiratory status: spontaneous breathing, nonlabored ventilation, respiratory function stable and patient connected to nasal cannula oxygen Cardiovascular status: blood pressure returned to baseline and stable Postop Assessment: no apparent nausea or vomiting Anesthetic complications: no   No notable events documented.                 Mariann Barter

## 2023-07-13 NOTE — Discharge Planning (Signed)
 [facility] Patient Discharge Orders- [facility] CLINIC: [facility]   Patient's name: Lindsey Murillo Admit/DC Dates: 07/04/2023 - 07/13/2023  Discharge Diagnoses: Sepsis 2/2 MSSA peritonitis/bacteremia  PD catheter related peritonitis - PD catheter removed. Resuming IHD   HD Orders -TTS  4:00  3K/2.5Ca   -BFR 400 DFR 600  -EDW 51kg   Aranesp: Given: --   Date and amount of last dose: --  Last Hgb: 9.2 PRBC's Given: -- Date/# of units: -- ESA dose for discharge: Mircera 100 mcg IV q 2 weeks-- due on 07/14/23.  IV Iron dose at discharge: --   Access intervention/Change: PD cathter removed 07/12/23   Hectorol/Calcitriol change: Calcitriol 2.25 mcg TIW  Discharge Labs: Calcium  7.3 Phosphorus 2.3 Albumin 1.5 K+ 3.5  IV Antibiotics: Cefazolin 2 g IV q HD until 08/10/23 for MSSA peritonitis/bacteremia    OTHER/APPTS/LAB ORDERS:    D/C Meds to be reconciled by nurse after every discharge.  Completed By:   Reviewed by: MD:______ RN_______

## 2023-07-14 ENCOUNTER — Telehealth: Payer: Self-pay | Admitting: *Deleted

## 2023-07-14 ENCOUNTER — Telehealth: Payer: Self-pay | Admitting: Nephrology

## 2023-07-14 LAB — BODY FLUID CULTURE W GRAM STAIN

## 2023-07-14 NOTE — Telephone Encounter (Signed)
 Transition of Care - Initial Contact from Inpatient Facility  Date of discharge: 07/13/23 Date of contact: 07/14/23 Method: Phone Spoke to: Patient  Patient contacted to discuss transition of care from recent inpatient hospitalization. Patient was admitted to [facility] from ... with discharge diagnosis of PD cathter related peritonitis.   The discharge medication list was reviewed.   Patient will return to his/her outpatient HD unit on: She wasn't able to go today but will make up treatment tomorrow 3/7. She endorses some continued nausea and diarrhea.

## 2023-07-14 NOTE — Transitions of Care (Post Inpatient/ED Visit) (Signed)
   07/14/2023  Name: MARQUIS DILES MRN: [id number] DOB: 05/31/1995  Today's TOC FU Call Status: Today's TOC FU Call Status:: Unsuccessful Call (1st Attempt) Unsuccessful Call (1st Attempt) Date: 07/14/23  Attempted to reach the patient regarding the most recent Inpatient/ED visit.  Follow Up Plan: Additional outreach attempts will be made to reach the patient to complete the Transitions of Care (Post Inpatient/ED visit) call.   Irving Shows [employer], BSN RN Care Manager/ Transition of Care [place]/ [facility] [phone]

## 2023-07-15 ENCOUNTER — Telehealth: Payer: Self-pay | Admitting: *Deleted

## 2023-07-15 DIAGNOSIS — R52 Pain, unspecified: Secondary | ICD-10-CM | POA: Diagnosis not present

## 2023-07-15 DIAGNOSIS — N2581 Secondary hyperparathyroidism of renal origin: Secondary | ICD-10-CM | POA: Diagnosis not present

## 2023-07-15 DIAGNOSIS — N186 End stage renal disease: Secondary | ICD-10-CM | POA: Diagnosis not present

## 2023-07-15 DIAGNOSIS — R110 Nausea: Secondary | ICD-10-CM | POA: Diagnosis not present

## 2023-07-15 DIAGNOSIS — D631 Anemia in chronic kidney disease: Secondary | ICD-10-CM | POA: Diagnosis not present

## 2023-07-15 DIAGNOSIS — Z992 Dependence on renal dialysis: Secondary | ICD-10-CM | POA: Diagnosis not present

## 2023-07-15 DIAGNOSIS — K658 Other peritonitis: Secondary | ICD-10-CM | POA: Diagnosis not present

## 2023-07-15 NOTE — Transitions of Care (Post Inpatient/ED Visit) (Signed)
   07/15/2023  Name: Lindsey Murillo MRN: [id number] DOB: [birth year]  Today's TOC FU Call Status: Today's TOC FU Call Status:: Unsuccessful Call (2nd Attempt) Unsuccessful Call (2nd Attempt) Date: 07/15/23  Attempted to reach the patient regarding the most recent Inpatient/ED visit.  Follow Up Plan: Additional outreach attempts will be made to reach the patient to complete the Transitions of Care (Post Inpatient/ED visit) call.   Irving Shows [facility], BSN RN Care Manager/ Transition of Care / [facility] [phone]

## 2023-07-16 ENCOUNTER — Encounter (HOSPITAL_COMMUNITY): Payer: Self-pay

## 2023-07-16 ENCOUNTER — Other Ambulatory Visit: Payer: Self-pay

## 2023-07-16 ENCOUNTER — Inpatient Hospital Stay (HOSPITAL_COMMUNITY)
Admission: EM | Admit: 2023-07-16 | Discharge: 2023-07-20 | DRG: 391 | Disposition: A | Discharge status: Home / Self Care | Attending: Internal Medicine | Admitting: Internal Medicine

## 2023-07-16 DIAGNOSIS — D631 Anemia in chronic kidney disease: Secondary | ICD-10-CM | POA: Diagnosis not present

## 2023-07-16 DIAGNOSIS — E785 Hyperlipidemia, unspecified: Secondary | ICD-10-CM | POA: Diagnosis not present

## 2023-07-16 DIAGNOSIS — E1043 Type 1 diabetes mellitus with diabetic autonomic (poly)neuropathy: Secondary | ICD-10-CM | POA: Diagnosis not present

## 2023-07-16 DIAGNOSIS — N2581 Secondary hyperparathyroidism of renal origin: Secondary | ICD-10-CM | POA: Diagnosis not present

## 2023-07-16 DIAGNOSIS — E10649 Type 1 diabetes mellitus with hypoglycemia without coma: Secondary | ICD-10-CM | POA: Diagnosis present

## 2023-07-16 DIAGNOSIS — R933 Abnormal findings on diagnostic imaging of other parts of digestive tract: Secondary | ICD-10-CM | POA: Diagnosis not present

## 2023-07-16 DIAGNOSIS — Z560 Unemployment, unspecified: Secondary | ICD-10-CM

## 2023-07-16 DIAGNOSIS — Z833 Family history of diabetes mellitus: Secondary | ICD-10-CM

## 2023-07-16 DIAGNOSIS — Z79899 Other long term (current) drug therapy: Secondary | ICD-10-CM

## 2023-07-16 DIAGNOSIS — K861 Other chronic pancreatitis: Secondary | ICD-10-CM | POA: Diagnosis not present

## 2023-07-16 DIAGNOSIS — Z681 Body mass index (BMI) 19 or less, adult: Secondary | ICD-10-CM

## 2023-07-16 DIAGNOSIS — E109 Type 1 diabetes mellitus without complications: Secondary | ICD-10-CM | POA: Diagnosis not present

## 2023-07-16 DIAGNOSIS — Z794 Long term (current) use of insulin: Secondary | ICD-10-CM | POA: Diagnosis not present

## 2023-07-16 DIAGNOSIS — E1022 Type 1 diabetes mellitus with diabetic chronic kidney disease: Secondary | ICD-10-CM | POA: Diagnosis present

## 2023-07-16 DIAGNOSIS — I700 Atherosclerosis of aorta: Secondary | ICD-10-CM | POA: Diagnosis not present

## 2023-07-16 DIAGNOSIS — L8990 Pressure ulcer of unspecified site, unspecified stage: Secondary | ICD-10-CM | POA: Diagnosis present

## 2023-07-16 DIAGNOSIS — Z7985 Long-term (current) use of injectable non-insulin antidiabetic drugs: Secondary | ICD-10-CM

## 2023-07-16 DIAGNOSIS — K219 Gastro-esophageal reflux disease without esophagitis: Secondary | ICD-10-CM | POA: Diagnosis present

## 2023-07-16 DIAGNOSIS — E1042 Type 1 diabetes mellitus with diabetic polyneuropathy: Secondary | ICD-10-CM | POA: Diagnosis present

## 2023-07-16 DIAGNOSIS — R42 Dizziness and giddiness: Secondary | ICD-10-CM

## 2023-07-16 DIAGNOSIS — Z792 Long term (current) use of antibiotics: Secondary | ICD-10-CM

## 2023-07-16 DIAGNOSIS — I120 Hypertensive chronic kidney disease with stage 5 chronic kidney disease or end stage renal disease: Secondary | ICD-10-CM | POA: Diagnosis present

## 2023-07-16 DIAGNOSIS — R197 Diarrhea, unspecified: Secondary | ICD-10-CM | POA: Diagnosis not present

## 2023-07-16 DIAGNOSIS — I951 Orthostatic hypotension: Secondary | ICD-10-CM | POA: Diagnosis present

## 2023-07-16 DIAGNOSIS — K6389 Other specified diseases of intestine: Secondary | ICD-10-CM | POA: Diagnosis not present

## 2023-07-16 DIAGNOSIS — N186 End stage renal disease: Secondary | ICD-10-CM | POA: Diagnosis present

## 2023-07-16 DIAGNOSIS — R5381 Other malaise: Secondary | ICD-10-CM | POA: Diagnosis not present

## 2023-07-16 DIAGNOSIS — B9561 Methicillin susceptible Staphylococcus aureus infection as the cause of diseases classified elsewhere: Secondary | ICD-10-CM | POA: Diagnosis not present

## 2023-07-16 DIAGNOSIS — E1065 Type 1 diabetes mellitus with hyperglycemia: Secondary | ICD-10-CM | POA: Diagnosis present

## 2023-07-16 DIAGNOSIS — K529 Noninfective gastroenteritis and colitis, unspecified: Secondary | ICD-10-CM | POA: Diagnosis not present

## 2023-07-16 DIAGNOSIS — E8770 Fluid overload, unspecified: Secondary | ICD-10-CM | POA: Diagnosis not present

## 2023-07-16 DIAGNOSIS — E43 Unspecified severe protein-calorie malnutrition: Secondary | ICD-10-CM | POA: Insufficient documentation

## 2023-07-16 DIAGNOSIS — I10 Essential (primary) hypertension: Secondary | ICD-10-CM | POA: Diagnosis present

## 2023-07-16 DIAGNOSIS — K8689 Other specified diseases of pancreas: Secondary | ICD-10-CM | POA: Diagnosis not present

## 2023-07-16 DIAGNOSIS — Z992 Dependence on renal dialysis: Secondary | ICD-10-CM

## 2023-07-16 DIAGNOSIS — E876 Hypokalemia: Secondary | ICD-10-CM | POA: Diagnosis present

## 2023-07-16 DIAGNOSIS — A4901 Methicillin susceptible Staphylococcus aureus infection, unspecified site: Secondary | ICD-10-CM | POA: Diagnosis not present

## 2023-07-16 DIAGNOSIS — R7881 Bacteremia: Secondary | ICD-10-CM | POA: Diagnosis not present

## 2023-07-16 DIAGNOSIS — N200 Calculus of kidney: Secondary | ICD-10-CM | POA: Diagnosis not present

## 2023-07-16 DIAGNOSIS — F32A Depression, unspecified: Secondary | ICD-10-CM | POA: Diagnosis present

## 2023-07-16 DIAGNOSIS — K652 Spontaneous bacterial peritonitis: Secondary | ICD-10-CM | POA: Diagnosis not present

## 2023-07-16 LAB — CBC
HCT: 24.5 % — ABNORMAL LOW (ref 36.0–46.0)
Hemoglobin: 8 g/dL — ABNORMAL LOW (ref 12.0–15.0)
MCH: 29.4 pg (ref 26.0–34.0)
MCHC: 32.7 g/dL (ref 30.0–36.0)
MCV: 90.1 fL (ref 80.0–100.0)
Platelets: 172 10*3/uL (ref 150–400)
RBC: 2.72 MIL/uL — ABNORMAL LOW (ref 3.87–5.11)
RDW: 20 % — ABNORMAL HIGH (ref 11.5–15.5)
WBC: 14.1 10*3/uL — ABNORMAL HIGH (ref 4.0–10.5)
nRBC: 0 % (ref 0.0–0.2)

## 2023-07-16 LAB — COMPREHENSIVE METABOLIC PANEL
ALT: <5 U/L (ref 0–44)
AST: 20 U/L (ref 15–41)
Albumin: <1.5 g/dL — ABNORMAL LOW (ref 3.5–5.0)
Alkaline Phosphatase: 96 U/L (ref 38–126)
Anion gap: 9 (ref 5–15)
BUN: 14 mg/dL (ref 6–20)
CO2: 25 mmol/L (ref 22–32)
Calcium: 7.7 mg/dL — ABNORMAL LOW (ref 8.9–10.3)
Chloride: 102 mmol/L (ref 98–111)
Creatinine, Ser: 4.32 mg/dL — ABNORMAL HIGH (ref 0.44–1.00)
GFR, Estimated: 14 mL/min — ABNORMAL LOW (ref >60)
Glucose, Bld: 101 mg/dL — ABNORMAL HIGH (ref 70–99)
Potassium: 3.2 mmol/L — ABNORMAL LOW (ref 3.5–5.1)
Sodium: 136 mmol/L (ref 135–145)
Total Bilirubin: 0.5 mg/dL (ref 0.0–1.2)
Total Protein: 4.8 g/dL — ABNORMAL LOW (ref 6.5–8.1)

## 2023-07-16 LAB — LIPASE, BLOOD: Lipase: 34 U/L (ref 11–51)

## 2023-07-16 NOTE — ED Triage Notes (Signed)
 Pt c/o diarrhea and dizzinessx2d.

## 2023-07-17 ENCOUNTER — Emergency Department (HOSPITAL_COMMUNITY)

## 2023-07-17 DIAGNOSIS — K529 Noninfective gastroenteritis and colitis, unspecified: Principal | ICD-10-CM

## 2023-07-17 DIAGNOSIS — R42 Dizziness and giddiness: Secondary | ICD-10-CM

## 2023-07-17 LAB — TSH: TSH: 2.185 u[IU]/mL (ref 0.350–4.500)

## 2023-07-17 LAB — C DIFFICILE QUICK SCREEN W PCR REFLEX
C Diff antigen: NEGATIVE
C Diff interpretation: NOT DETECTED
C Diff toxin: NEGATIVE

## 2023-07-17 LAB — MAGNESIUM: Magnesium: 1.9 mg/dL (ref 1.7–2.4)

## 2023-07-17 LAB — LIPASE, BLOOD: Lipase: 31 U/L (ref 11–51)

## 2023-07-17 LAB — GLUCOSE, CAPILLARY
Glucose-Capillary: 129 mg/dL — ABNORMAL HIGH (ref 70–99)
Glucose-Capillary: 121 mg/dL — ABNORMAL HIGH (ref 70–99)

## 2023-07-17 LAB — CBG MONITORING, ED: Glucose-Capillary: 94 mg/dL (ref 70–99)

## 2023-07-17 LAB — I-STAT CG4 LACTIC ACID, ED: Lactic Acid, Venous: 1 mmol/L (ref 0.5–1.9)

## 2023-07-17 LAB — HCG, QUANTITATIVE, PREGNANCY: hCG, Beta Chain, Quant, S: 3 m[IU]/mL (ref <5)

## 2023-07-17 MED ORDER — ONDANSETRON HCL 4 MG PO TABS
4.0000 mg | ORAL_TABLET | Freq: Four times a day (QID) | ORAL | Status: DC | PRN
  Administered 2023-07-20: 4 mg via ORAL
  Filled 2023-07-17: qty 1

## 2023-07-17 MED ORDER — INSULIN ASPART 100 UNIT/ML IJ SOLN
0.0000 [IU] | Freq: Three times a day (TID) | INTRAMUSCULAR | Status: DC
  Administered 2023-07-19: 1 [IU] via SUBCUTANEOUS

## 2023-07-17 MED ORDER — CHLORHEXIDINE GLUCONATE CLOTH 2 % EX PADS
6.0000 | MEDICATED_PAD | Freq: Every day | CUTANEOUS | Status: DC
  Administered 2023-07-18 – 2023-07-20 (×3): 6 via TOPICAL

## 2023-07-17 MED ORDER — ROSUVASTATIN CALCIUM 5 MG PO TABS
10.0000 mg | ORAL_TABLET | Freq: Every day | ORAL | Status: DC
  Administered 2023-07-17 – 2023-07-19 (×3): 10 mg via ORAL
  Filled 2023-07-17 (×3): qty 2

## 2023-07-17 MED ORDER — ACETAMINOPHEN 650 MG RE SUPP: 650.0000 mg | Freq: Four times a day (QID) | RECTAL | Status: DC | PRN

## 2023-07-17 MED ORDER — MIDODRINE HCL 5 MG PO TABS
5.0000 mg | ORAL_TABLET | Freq: Three times a day (TID) | ORAL | Status: DC
  Administered 2023-07-17 – 2023-07-20 (×9): 5 mg via ORAL
  Filled 2023-07-17 (×9): qty 1

## 2023-07-17 MED ORDER — CEFAZOLIN SODIUM-DEXTROSE 1-4 GM/50ML-% IV SOLN
1.0000 g | INTRAVENOUS | Status: DC
  Administered 2023-07-17 – 2023-07-19 (×3): 1 g via INTRAVENOUS
  Filled 2023-07-17 (×4): qty 50

## 2023-07-17 MED ORDER — INSULIN DEGLUDEC 100 UNIT/ML ~~LOC~~ SOPN: 15.0000 [IU] | PEN_INJECTOR | Freq: Every day | SUBCUTANEOUS | Status: DC

## 2023-07-17 MED ORDER — ALBUTEROL SULFATE HFA 108 (90 BASE) MCG/ACT IN AERS: 2.0000 | INHALATION_SPRAY | RESPIRATORY_TRACT | Status: DC | PRN

## 2023-07-17 MED ORDER — CALCITRIOL 0.5 MCG PO CAPS
1.0000 ug | ORAL_CAPSULE | Freq: Every day | ORAL | Status: DC
  Administered 2023-07-17 – 2023-07-20 (×4): 1 ug via ORAL
  Filled 2023-07-17 (×4): qty 2

## 2023-07-17 MED ORDER — INSULIN GLARGINE 100 UNIT/ML ~~LOC~~ SOLN
15.0000 [IU] | Freq: Every day | SUBCUTANEOUS | Status: DC
  Filled 2023-07-17: qty 0.15

## 2023-07-17 MED ORDER — HEPARIN SODIUM (PORCINE) 5000 UNIT/ML IJ SOLN
5000.0000 [IU] | Freq: Three times a day (TID) | INTRAMUSCULAR | Status: DC
  Administered 2023-07-17 – 2023-07-20 (×9): 5000 [IU] via SUBCUTANEOUS
  Filled 2023-07-17 (×10): qty 1

## 2023-07-17 MED ORDER — POTASSIUM CHLORIDE CRYS ER 20 MEQ PO TBCR
20.0000 meq | EXTENDED_RELEASE_TABLET | Freq: Once | ORAL | Status: AC
  Administered 2023-07-17: 20 meq via ORAL
  Filled 2023-07-17: qty 1

## 2023-07-17 MED ORDER — DIALYVITE 800 0.8 MG PO TABS: 1.0000 | ORAL_TABLET | Freq: Every day | ORAL | Status: DC

## 2023-07-17 MED ORDER — RENA-VITE PO TABS
1.0000 | ORAL_TABLET | Freq: Every day | ORAL | Status: DC
  Administered 2023-07-17 – 2023-07-19 (×3): 1 via ORAL
  Filled 2023-07-17 (×3): qty 1

## 2023-07-17 MED ORDER — SUCROFERRIC OXYHYDROXIDE 500 MG PO CHEW
1000.0000 mg | CHEWABLE_TABLET | Freq: Three times a day (TID) | ORAL | Status: DC
  Administered 2023-07-18: 1000 mg via ORAL
  Filled 2023-07-17 (×3): qty 2

## 2023-07-17 MED ORDER — INSULIN GLARGINE 100 UNIT/ML ~~LOC~~ SOLN
15.0000 [IU] | Freq: Every day | SUBCUTANEOUS | Status: DC
Start: 2023-07-17 — End: 2023-07-18
  Administered 2023-07-17: 15 [IU] via SUBCUTANEOUS
  Filled 2023-07-17 (×2): qty 0.15

## 2023-07-17 MED ORDER — ONDANSETRON HCL 4 MG/2ML IJ SOLN
4.0000 mg | Freq: Four times a day (QID) | INTRAMUSCULAR | Status: DC | PRN
  Administered 2023-07-19: 4 mg via INTRAVENOUS
  Filled 2023-07-17: qty 2

## 2023-07-17 MED ORDER — OXYCODONE HCL 5 MG PO TABS
5.0000 mg | ORAL_TABLET | ORAL | Status: DC | PRN
  Administered 2023-07-17 – 2023-07-20 (×4): 5 mg via ORAL
  Filled 2023-07-17 (×4): qty 1

## 2023-07-17 MED ORDER — ALBUTEROL SULFATE (2.5 MG/3ML) 0.083% IN NEBU: 2.5000 mg | INHALATION_SOLUTION | RESPIRATORY_TRACT | Status: DC | PRN

## 2023-07-17 MED ORDER — ACETAMINOPHEN 325 MG PO TABS
650.0000 mg | ORAL_TABLET | Freq: Four times a day (QID) | ORAL | Status: DC | PRN
  Filled 2023-07-17: qty 2

## 2023-07-17 MED ORDER — PANTOPRAZOLE SODIUM 40 MG PO TBEC
40.0000 mg | DELAYED_RELEASE_TABLET | Freq: Two times a day (BID) | ORAL | Status: DC
  Administered 2023-07-18 – 2023-07-20 (×4): 40 mg via ORAL
  Filled 2023-07-17 (×5): qty 1

## 2023-07-17 MED ORDER — SODIUM CHLORIDE 0.9 % IV BOLUS
250.0000 mL | Freq: Once | INTRAVENOUS | Status: AC
  Administered 2023-07-17: 250 mL via INTRAVENOUS

## 2023-07-17 NOTE — Consult Note (Signed)
 Renal Service Consult Note [facility]  Lindsey Murillo 07/17/2023 Lindsey Krabbe, MD Requesting Physician: Lindsey Murillo  Reason for Consult: ESRD pt w/ enterocolitis HPI: The patient is a 28 y.o. year-old w/ PMH as below who presented to yest evening w/ c/o diarrhea and dizziness x 2 days.  She states her last dialysis session was Friday and her blood pressure was low so they shoot told her she could not do the next session on Saturday.  She had been getting dizzy due to the low blood pressures.  She states he has had diarrhea since he was discharged from the hospital on March 5.  Her PD catheter was removed recently on this hospitalization.  No nausea or vomiting no fever chills she does have worsening leg swelling/edema. In ED BP 96/61, HR 101, RR 17, SpO2 99% on room air.  WBC 14, Hgb 8, potassium 3.2, creatinine 4.3.  CT abdomen/pelvis: Marked severity enterocolitis, also diffuse urinary bladder wall thickening which may represent sequela associated with cystitis.  Also findings consistent with chronic pancreatitis.  TRH was asked to admit.  We are asked to see for dialysis.  Recent admit was to ICU 2/24 - 07/13/2023 for septic shock in the setting of PD catheter associated peritonitis and MSSA bacteremia.  The shock resolved within a day and on 3/4 the PD catheter was removed by VVS.  The TEE showed no endocarditis and she was to get IV Ancef with dialysis for 4 weeks.  She had also diarrhea and had a negative C. difficile and a negative GI pathogen panel.   Pt seen in hospital room.  She states that she got dialysis on Friday instead of Thursday at her outpatient unit in [place].  They did not want her to come in Saturday because her blood pressures were too low on Friday.  So she came to the emergency room instead on Saturday.  She is feeling volume overloaded says she came off 3 to 4 kg over her dry weight on Friday last week.  She only had 2 dialysis sessions last week 1 was  in the hospital and the other 1 was on Friday 3/7.  She denies any shortness of breath, cough or orthopnea.    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 10/21/2021   Diabetic autonomic neuropathy ([name])    Diabetic peripheral neuropathy ([name])    Environmental allergies    Goiter    History of blood transfusion    Hypertension    no meds currently   Hypoglycemia associated with diabetes ([name])    Musculoskeletal pain 04/20/2022   Tachycardia    Thyroiditis, autoimmune    Type 1 diabetes mellitus in patient age 22-19 years with HbA1C goal below 7.5    Past Surgical History  Past Surgical History:  Procedure Laterality Date   AV FISTULA PLACEMENT Right 12/08/2021   Procedure: ARTERIOVENOUS  FISTULA CREATION VERSUS GRAFT;  Surgeon: Lindsey Earthly, MD;  Location: [facility];  Service: Vascular;  Laterality: Right;   BASCILIC VEIN TRANSPOSITION Right 01/12/2022   Procedure: RIGHT ARM SECOND STAGE BASILIC VEIN TRANSPOSITION;  Surgeon: Lindsey Earthly, MD;  Location: [facility];  Service: Vascular;  Laterality: Right;   BIOPSY  08/27/2016   Procedure: BIOPSY;  Surgeon: Lindsey Bali, MD;  Location: [facility];  Service: Endoscopy;;  duodenum; gastric   BIOPSY  06/27/2019   Procedure: BIOPSY;  Surgeon: Lindsey Ade, MD;  Location: [facility];  Service: Endoscopy;;   BIOPSY  02/09/2022   Procedure: BIOPSY;  Surgeon: Lindsey Bal, DO;   Location: [facility];  Service: Endoscopy;;   BREAST CYST EXCISION Right 11/12/2021   Procedure: RIGHT BREAST ABSCESS INCISION AND DRAINAGE;  Surgeon: Lindsey Loron, MD;  Location: [facility] OR;  Service: General;  Laterality: Right;  LMA   CAPD REMOVAL N/A 07/12/2023   Procedure: REMOVAL, CATHETER, CAPD;  Surgeon: Lindsey Sparrow, MD;  Location: [facility] OR;  Service: Vascular;  Laterality: N/A;   COLONOSCOPY     COLONOSCOPY WITH PROPOFOL N/A 02/09/2022   Procedure: COLONOSCOPY WITH PROPOFOL;  Surgeon: Lindsey Bal, DO;  Location: [facility];  Service: Endoscopy;  Laterality: N/A;  7:30am, asa 3, dialysis pt   ESOPHAGOGASTRODUODENOSCOPY N/A 08/27/2016   Dr. Darrick Murillo: mild gastritis. Negative celiac. No obvious source for dyspepsia/diarrhea   ESOPHAGOGASTRODUODENOSCOPY (EGD) WITH PROPOFOL N/A 06/27/2019   Lindsey Murillo: Focal abnormality of the gastric mucosa likely due to trauma (heaving).  Biopsy showed mild gastritis, negative for H. pylori.  Esophageal dilation for history of dysphagia but normal-appearing esophagus.   ESOPHAGOGASTRODUODENOSCOPY (EGD) WITH PROPOFOL N/A 02/09/2022   Procedure: ESOPHAGOGASTRODUODENOSCOPY (EGD) WITH PROPOFOL;  Surgeon: Lindsey Bal, DO;  Location: [facility];  Service: Endoscopy;  Laterality: N/A;   IR FLUORO GUIDE CV LINE RIGHT  10/13/2021   IR FLUORO GUIDE CV LINE RIGHT  10/19/2021   IR US GUIDE VASC ACCESS RIGHT  10/13/2021   IRRIGATION AND DEBRIDEMENT ABSCESS N/A 10/04/2021   Procedure: IRRIGATION AND DEBRIDEMENT NECK ABSCESS;  Surgeon: Lindsey Filler, MD;  Location: [facility] OR;  Service: General;  Laterality: N/A;   TRANSESOPHAGEAL ECHOCARDIOGRAM (CATH LAB) N/A 07/08/2023   Procedure: TRANSESOPHAGEAL ECHOCARDIOGRAM;  Surgeon: Lindsey Poisson, MD;  Location: [facility] INVAS[facility] CV LAB;  Service: Cardiovascular;  Laterality: N/A;   Family History  Family History  Problem Relation Age of Onset   Alcohol abuse Paternal Grandfather    Huntington's disease Maternal Grandfather     Cancer Father        pancreatic   Diabetes Mother        Type II DM   Colon cancer Neg Hx    Colon polyps Neg Hx    Social History  reports that she has never smoked. She has never been exposed to tobacco smoke. She has never used smokeless tobacco. She reports that she does not drink alcohol and does not use drugs. Allergies No Known Allergies Home medications Prior to Admission medications   Medication Sig Start Date End Date Taking? Authorizing Provider  B Complex-C-Folic Acid (DIALYVITE 800) 0.8 MG TABS Take 1 tablet by mouth daily. 11/16/21  Yes [provider]  calcitRIOL (ROCALTROL) 0.5 MCG capsule Take 1 mcg by mouth daily.   Yes [provider]  ceFAZolin (ANCEF) 1-4 GM/50ML-% SOLN Inject 100 mLs (2 g total) into the vein every dialysis for up to 27 days. 07/13/23 08/09/23 Yes Dorcas Carrow, MD  Insulin Aspart, w/Niacinamide, (FIASP) 100  UNIT/ML SOLN Inject 5-8 Units as directed with breakfast, with lunch, and with evening meal. Sliding Scale   Yes [provider]  medroxyPROGESTERone (PROVERA) 10 MG tablet Take provera 10 mg for 10 days every 3 months 11/16/22  Yes Cyril Mourning A, NP  midodrine (PROAMATINE) 5 MG tablet Take 1 tablet (5 mg total) by mouth 3 (three) times daily with meals. 07/13/23  Yes Dorcas Carrow, MD  pantoprazole (PROTONIX) 40 MG tablet Take 1 tablet (40 mg total) by mouth 2 (two) times daily before a meal. 03/22/23 03/16/24 Yes Mahon, Courtney L, NP  rosuvastatin (CRESTOR) 10 MG tablet Take 1 tablet (10 mg total) by mouth daily. 03/15/23  Yes Cook, Verdis Frederickson, DO  sucroferric oxyhydroxide (VELPHORO) 500 MG chewable tablet Chew 1,000 mg by mouth 3 (three) times daily with meals. 11/16/21  Yes [provider]  TRESIBA FLEXTOUCH 100 UNIT/ML FlexTouch Pen Inject 15 Units into the skin at bedtime. 06/23/23  Yes Reardon, Freddi Starr, NP  VENTOLIN HFA 108 (90 Base) MCG/ACT inhaler Inhale 2 puffs into the lungs every 4 (four) hours as needed  for wheezing or shortness of breath. 2 puffs once every 4 hours as needed for cough, wheeze, shortness of breath and chest tightness. May use 2 puffs 5 to 15 minutes before activity to decrease cough or wheeze. 03/24/23  Yes Padgett, Pilar Grammes, MD  acetaminophen (TYLENOL) 325 MG tablet Take 325 mg by mouth every 6 (six) hours as needed for mild pain (pain score 1-3) or moderate pain (pain score 4-6). Patient not taking: Reported on 07/04/2023 12/24/21   [provider]  cetirizine HCl (ZYRTEC) 5 MG/5ML SOLN TAKE 1.25 ML TWICE A DAY AS NEEDED FOR A RUNNY NOSE OR ITCH Patient not taking: Reported on 07/17/2023 05/09/23   Marcelyn Bruins, MD  Continuous Glucose Receiver (DEXCOM G7 RECEIVER) DEVI by Does not apply route.    [provider]  Continuous Glucose Sensor (DEXCOM G7 SENSOR) MISC by Does not apply route.    [provider]  gentamicin cream (GARAMYCIN) 0.1 % Apply 1 Application topically daily. Patient not taking: Reported on 07/17/2023    [provider]     Vitals:   07/17/23 1440 07/17/23 1445 07/17/23 1718 07/17/23 2006  BP: 113/71 113/71 94/66 100/71  Pulse: (!) 114 (!) 114 (!) 106 99  Resp:  18 15 19   Temp: 98.1 F (36.7 C) 98.1 F (36.7 C) 98.2 F (36.8 C) 98.1 F (36.7 C)  TempSrc: Oral  Oral   SpO2: 100% 100% 100% 99%  Weight:      Height:       Exam Gen alert, no distress No rash, cyanosis or gangrene Sclera anicteric, throat clear  No jvd or bruits Chest clear bilat to bases, no rales/ wheezing RRR no MRG Abd soft ntnd no mass or ascites +bs GU defer MS no joint effusions or deformity Ext mild to moderate bilateral pretibial edema, no other edema Neuro is alert, Ox 3 , nf    RUE AVF +bruit       Renal-related home meds: - dialyvite - midodrine 5 tid - velphoro 1 gm ac tid     OP HD: TTS [facility] 4h  B400   51kg  3K bath  AVF   Heparin none - last OP HD 3/07, post wt 55.6kg (4.6kg over) - rocaltrol 2.25  mcg po three times per week - mircera 100 mcg q 2 wks, last 3/07, due 3/21   Assessment/ Plan: Enterocolitis -  severe by CT reading. Pt w/ flank pain, abd pain, diarrhea. She did meet SIRS with tachycardia and ^wbc. W/u in progress w/ pending stool studies.  MSSA bacteremia - to get 4 wks IV Ancef w/ HD thru 4/01 from last admit. Pharm consult pending.  ESRD - on HD TTS. Last HD off schedule last Friday 3/07. Pt feeling fluid overloaded and only had 2 HD sessions last week. Will plan for HD tomorrow off schedule.  Chronic hypotension - on midodrine 5 tid at home.  Volume - swollen ankles, lungs sound clear.  Will plan UF to 2-1/2 L with dialysis tomorrow. Anemia of esrd -hemoglobin 9.2 here, follow.  Next ESA due on 3/21. Secondary hyperparathyroidism -corrected calcium level is within range as is phosphorus.  Continue binders with meals.      Vinson Moselle  MD [facility] 07/17/2023, 9:57 PM  Recent Labs  Lab 07/12/23 0503 07/13/23 0532 07/16/23 1801  HGB  --  9.2* 8.0*  ALBUMIN <1.5* <1.5* <1.5*  CALCIUM 7.3* 7.3* 7.7*  PHOS 3.1 2.3*  --   CREATININE 7.92* 5.03* 4.32*  K 4.0 3.5 3.2*   Inpatient medications:  calcitRIOL  1 mcg Oral Daily   heparin  5,000 Units Subcutaneous Q8H   insulin aspart  0-6 Units Subcutaneous TID WC   insulin glargine  15 Units Subcutaneous QHS   [START ON 07/18/2023] midodrine  5 mg Oral TID WC   multivitamin  1 tablet Oral QHS   [START ON 07/18/2023] pantoprazole  40 mg Oral BID AC   rosuvastatin  10 mg Oral QHS   [START ON 07/18/2023] sucroferric oxyhydroxide  1,000 mg Oral TID WC     ceFAZolin (ANCEF) IV 1 g (07/17/23 1838)   acetaminophen **OR** acetaminophen, albuterol, ondansetron **OR** ondansetron (ZOFRAN) IV, oxyCODONE

## 2023-07-17 NOTE — ED Provider Notes (Signed)
 [facility]PT [facility] Emergency Department Provider Note MRN:  [id number]  Arrival date & time: 07/17/23     Chief Complaint   Dizziness and Diarrhea   History of Present Illness   Lindsey Murillo is a 28 y.o. year-old female presents to the ED with chief complaint of diarrhea.  She was just discharged a few days ago after admission for sepsis from peritoneal dialysis catheter.  She was septic with peritonitis and MSSA bacteremia.  Was admitted to the ICU on pressors and transferred to hospitalist after stabilized.  PD catheter was removed and patient was switched to using her working right sided brachiobasilic fistula.  She states that she was told to come back for persistent diarrhea, which she has been having.  She had a c. Diff and GI pathogen panel while hospitalized that were normal.  She continues to received abx with dialysis.  She states that whenever she stands up, she feels light headed and dizzy.  She continues to have diarrhea.  History provided by patient.   Review of Systems  Pertinent positive and negative review of systems noted in HPI.    Physical Exam   Vitals:   07/17/23 0324 07/17/23 0338  BP: (!) 147/90   Pulse: 93   Resp: 16   Temp:  98.4 F (36.9 C)  SpO2: 100%     CONSTITUTIONAL:  chronically ill-appearing, NAD NEURO:  Alert and oriented x 3, CN 3-12 grossly intact EYES:  eyes equal and reactive ENT/NECK:  Supple, no stridor  CARDIO:  normal rate, regular rhythm, appears well-perfused  PULM:  No respiratory distress, CTAB GI/GU:  non-distended, generalized abdominal discomfort MSK/SPINE:  No gross deformities, no edema, moves all extremities  SKIN:  no rash, atraumatic   *Additional and/or pertinent findings included in MDM below  Diagnostic and Interventional Summary    EKG Interpretation Date/Time:    Ventricular Rate:    PR Interval:    QRS Duration:    QT Interval:    QTC Calculation:   R Axis:      Text  Interpretation:         Labs Reviewed  COMPREHENSIVE METABOLIC PANEL - Abnormal; Notable for the following components:      Result Value   Potassium 3.2 (*)    Glucose, Bld 101 (*)    Creatinine, Ser 4.32 (*)    Calcium 7.7 (*)    Total Protein 4.8 (*)    Albumin <1.5 (*)    GFR, Estimated 14 (*)    All other components within normal limits  CBC - Abnormal; Notable for the following components:   WBC 14.1 (*)    RBC 2.72 (*)    Hemoglobin 8.0 (*)    HCT 24.5 (*)    RDW 20.0 (*)    All other components within normal limits  C DIFFICILE QUICK SCREEN W PCR REFLEX    GASTROINTESTINAL PANEL BY PCR, STOOL (REPLACES STOOL CULTURE)  LIPASE, BLOOD  HCG, QUANTITATIVE, PREGNANCY  URINALYSIS, ROUTINE W REFLEX MICROSCOPIC  I-STAT CG4 LACTIC ACID, ED  I-STAT CG4 LACTIC ACID, ED    CT ABDOMEN PELVIS WO CONTRAST  Final Result      Medications - No data to display   Procedures  /  Critical Care Procedures  ED Course and Medical Decision Making  I have reviewed the triage vital signs, the nursing notes, and pertinent available records from the EMR.  Social Determinants Affecting Complexity of Care: Patient has no clinically significant social determinants  affecting this chief complaint..   ED Course: Clinical Course as of 07/17/23 0459  Sun Jul 17, 2023  0458 I-Stat CG4 Lactic Acid [RB]  0458 CBC(!) Leukocytosis to 14, afebrile.  [RB]  0459 Comprehensive metabolic panel(!) K is low at 3.2, likely due to her diarrhea. [RB]    Clinical Course User Index [RB] Roxy Horseman, PA-C    Medical Decision Making Patient here with worsening diarrhea after discharge.  Will check labs and imaging.  CT shows marked severity enterocolitis.  She feels quite dizzy when she stands up or moves.  Will readmit to the hospital.   Amount and/or Complexity of Data Reviewed Labs: ordered. Decision-making details documented in ED Course. Radiology: ordered.  Risk Decision regarding  hospitalization.         Consultants: I consulted with Hospitalist, Dr. Maisie Fus, who is appreciated for admitting. Dr. Maisie Fus recommends repeating C. Diff and GI pathogen panel due to worsening symptoms.   Treatment and Plan: Patient's exam and diagnostic results are concerning for enterocolitis.  Feel that patient will need admission to the hospital for further treatment and evaluation.    Final Clinical Impressions(s) / ED Diagnoses     ICD-10-CM   1. Diarrhea, unspecified type  R19.7       ED Discharge Orders     None         Discharge Instructions Discussed with and Provided to Patient:   Discharge Instructions   None      Roxy Horseman, PA-C 07/17/23 0500    Royanne Foots, DO 07/21/23 1441

## 2023-07-17 NOTE — Assessment & Plan Note (Addendum)
 Was on PD HD, but PD catheter removed on 3/4 and started on HD Last HD session (she states) was Friday 3/7 and told her blood pressure was too low Nephrology consulted and appreciate

## 2023-07-17 NOTE — Assessment & Plan Note (Signed)
 Continue crestor '10mg'$  daily

## 2023-07-17 NOTE — Assessment & Plan Note (Addendum)
 Check magnesium Oral repletion low dose  Trend

## 2023-07-17 NOTE — ED Notes (Signed)
 Pt ambulated to bathroom, was unsuccessful at providing urine sample at this time

## 2023-07-17 NOTE — Assessment & Plan Note (Signed)
 A1C of 7.2 Continue long actin insulin 15 units at bedtime SSI and accuchecks QAC/HS

## 2023-07-17 NOTE — Assessment & Plan Note (Signed)
 Admitted to ICU 2/24-07/13/23 for septic shock in setting of PD catheter associated peritonitis and MSSA bacteremia. Required levophed for one day. Transferred to [facility] on 2/27.  3/4 PD catheter removed by [facility].  TEE:  no endocarditis.  Recommended IV ancef with HD x 4 weeks (EOT 4/1) Pharm consult to continue ancef while inpatient

## 2023-07-17 NOTE — Assessment & Plan Note (Addendum)
 28 year old presenting with worsening diarrhea and left lateral abdominal pain found to have severe enterocolitis on CT abdomen/pelvis.  -obs to tele  -was admitted 2/24-3/5 for septic shock in setting of PD catheter associated peritonitis and MSSA bacteremia.  -Had diarrhea at this time and her C.diff and stool studies were negative 07/10/23 -she has no fever/chills, normal lactic acid. She does meet SIRS with tachycardia and leukocytosis  -abdominal exam benign -C.Diff and GI stool PCR sent again. C diff negative  -enteric precautions  -continue Ancef. Hold any further medication until stool study results

## 2023-07-17 NOTE — Assessment & Plan Note (Signed)
Stage 2 

## 2023-07-17 NOTE — Assessment & Plan Note (Addendum)
 Likely secondary to othostatic hypotension Orthostatic vitals positive and she has with position changes Small 250cc IVF bolus  TED hose Continue midodrine, may need dosage adjustment

## 2023-07-17 NOTE — ED Notes (Signed)
 Pt assisted restroom with wheelchair. Pt had liquid BM.

## 2023-07-17 NOTE — Assessment & Plan Note (Signed)
 Continue protonix BID

## 2023-07-17 NOTE — Plan of Care (Addendum)
   Problem: Fluid Volume: Goal: Ability to maintain a balanced intake and output will improve Outcome: Progressing   Problem: Activity: Goal: Risk for activity intolerance will decrease Outcome: Progressing

## 2023-07-17 NOTE — Plan of Care (Signed)

## 2023-07-17 NOTE — Progress Notes (Addendum)
 Pharmacy Antibiotic Note  Lindsey Murillo is a 28 y.o. female admitted on 07/16/2023 with  MSSA bacteremia from PD cath .  Pharmacy has been consulted for ancef dosing.Patient recently admitted with MSSA bacteremia from PD cath. PD cath was removed and patient transitioned to HD, last session on Friday per patient. Patient to be on 2g ancef with HD until 4/1 per ID   Plan: Will transition to Ancef 1g q24h for now, timed after HD on HD days.  F/u HD schedule - consider 2g with HD inpatient if schedule is regular.  Patient new to HD, unclear what schedule will be inpatient, f/u nephro note.   Height: 5\' 8"  (172.7 cm) Weight: 53.2 kg (117 lb 4.6 oz) IBW/kg (Calculated) : 63.9  Temp (24hrs), Avg:97.9 F (36.6 C), Min:96.6 F (35.9 C), Max:98.7 F (37.1 C)  Recent Labs  Lab 07/11/23 0443 07/12/23 0503 07/13/23 0532 07/16/23 1801 07/17/23 0117  WBC 13.4*  --  17.7* 14.1*  --   CREATININE 7.03* 7.92* 5.03* 4.32*  --   LATICACIDVEN  --   --   --   --  1.0    Estimated Creatinine Clearance: 16.3 mL/min (A) (by C-G formula based on SCr of 4.32 mg/dL (H)).    No Known Allergies  Thank you for allowing pharmacy to be a part of this patient's care.  Estill Batten, PharmD, BCCCP  07/17/2023 3:46 PM

## 2023-07-17 NOTE — H&P (Addendum)
 History and Physical    Patient: Lindsey Murillo ZOX:[id number] DOB: 1995/05/20 DOA: 07/16/2023 DOS: the patient was seen and examined on 07/17/2023 PCP: [name], DO  Patient coming from: Home - lives with mother and brother. Uses cane to ambulate.    Chief Complaint: diarrhea, left abdominal pain   HPI: Lindsey Murillo is a 28 y.o. female with medical history significant of  T1DM, HTN, autoimmune thyroiditis and ERSD - on PD , prior on HD, stage 2 pressure ulcer, HLD, normocytic anemia who presented to ED with complaints of diarrhea and left lateral abdominal pain. She states her last dialysis session was Friday and her blood pressure was low so they told her she couldn't do next session. She states she has been getting dizzy due to low pressures. She states she has had diarrhea since hospital discharge on 3/5 and they told her to come back if this continued. She states her left sided abdominal pain started when they took out her PD catheter. Pain is 8/10, intermittent and sharp in nature. Nothing makes it better or worse. She can't really tell me how any episodes of diarrhea that she is having, denies any blood. Having to use a pad. Also endorses nausea and vomiting. NO fever or chills. She has worsening LE swelling.   Admitted to ICU 2/24-07/13/23 for septic shock in setting of PD catheter associated peritonitis and MSSA bacteremia. Required levophed for one day. Transferred to [facility] on 2/27. 3/4 PD catheter removed by [facility]. She underwent TEE that showed no endocarditis. Recommended IV ancef with HD x 4 weeks (EOT 4/1). Also had diarrhea with negative GI pathogen panel and negative C.diff studies.    Denies any fever/chills, vision changes/headaches, chest pain or palpitations, shortness of breath or cough, dysuria.   She does not smoke or drink alcohol.   ER Course:  vitals: afebrile, bp: 96/61, HR: 101, RR: 17, oxygen: 99%RA Pertinent labs: wbc: 14.1, hgb: 8.0, potassium: 3.2,  creatinine: 4.32,  CT abdomen/pelvis: marked severity enterocolitis. Mild diffuse urinary bladder wall thickening which may represent sequelae associated with cystitis. Mild to moderate severity anasarca. Findings consistent with chronic pancreatitis  In ED: stool studies ordered, TRH asked to admit.     Review of Systems: As mentioned in the history of present illness. All other systems reviewed and are negative. 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 10/21/2021   Diabetic autonomic neuropathy ([name])    Diabetic peripheral neuropathy ([name])    Environmental allergies    Goiter    History of blood transfusion    Hypertension    no meds currently   Hypoglycemia associated with diabetes ([name])    Musculoskeletal pain 04/20/2022   Tachycardia    Thyroiditis, autoimmune    Type 1 diabetes mellitus in patient age 74-19 years with HbA1C goal below 7.5    Past Surgical History:  Procedure Laterality Date   AV FISTULA PLACEMENT Right 12/08/2021   Procedure: ARTERIOVENOUS  FISTULA CREATION VERSUS GRAFT;  Surgeon: Larina Earthly, MD;  Location: [facility];  Service: Vascular;  Laterality: Right;   BASCILIC VEIN TRANSPOSITION Right 01/12/2022   Procedure: RIGHT ARM SECOND STAGE BASILIC VEIN TRANSPOSITION;  Surgeon: Larina Earthly, MD;  Location: [facility];  Service: Vascular;  Laterality: Right;   BIOPSY  08/27/2016   Procedure: BIOPSY;  Surgeon: West Bali, MD;  Location: [facility];  Service: Endoscopy;;  duodenum; gastric   BIOPSY  06/27/2019   Procedure: BIOPSY;  Surgeon: Corbin Ade, MD;  Location: [facility];  Service: Endoscopy;;   BIOPSY  02/09/2022   Procedure: BIOPSY;  Surgeon: Lanelle Bal, DO;  Location: [facility];  Service:  Endoscopy;;   BREAST CYST EXCISION Right 11/12/2021   Procedure: RIGHT BREAST ABSCESS INCISION AND DRAINAGE;  Surgeon: Emelia Loron, MD;  Location: [facility] OR;  Service: General;  Laterality: Right;  LMA   CAPD REMOVAL N/A 07/12/2023   Procedure: REMOVAL, CATHETER, CAPD;  Surgeon: Victorino Sparrow, MD;  Location: [facility] OR;  Service: Vascular;  Laterality: N/A;   COLONOSCOPY     COLONOSCOPY WITH PROPOFOL N/A 02/09/2022   Procedure: COLONOSCOPY WITH PROPOFOL;  Surgeon: Lanelle Bal, DO;  Location: [facility];  Service: Endoscopy;  Laterality: N/A;  7:30am, asa 3, dialysis pt   ESOPHAGOGASTRODUODENOSCOPY N/A 08/27/2016   Dr. Darrick Penna: mild gastritis. Negative celiac. No obvious source for dyspepsia/diarrhea   ESOPHAGOGASTRODUODENOSCOPY (EGD) WITH PROPOFOL N/A 06/27/2019   rourk: Focal abnormality of the gastric mucosa likely due to trauma (heaving).  Biopsy showed mild gastritis, negative for H. pylori.  Esophageal dilation for history of dysphagia but normal-appearing esophagus.   ESOPHAGOGASTRODUODENOSCOPY (EGD) WITH PROPOFOL N/A 02/09/2022   Procedure: ESOPHAGOGASTRODUODENOSCOPY (EGD) WITH PROPOFOL;  Surgeon: Lanelle Bal, DO;  Location: [facility];  Service: Endoscopy;  Laterality: N/A;   IR FLUORO GUIDE CV LINE RIGHT  10/13/2021   IR FLUORO GUIDE CV LINE RIGHT  10/19/2021   IR US GUIDE VASC ACCESS RIGHT  10/13/2021   IRRIGATION AND DEBRIDEMENT ABSCESS N/A 10/04/2021   Procedure: IRRIGATION AND DEBRIDEMENT NECK ABSCESS;  Surgeon: Axel Filler, MD;  Location: [facility] OR;  Service: General;  Laterality: N/A;   TRANSESOPHAGEAL ECHOCARDIOGRAM (CATH LAB) N/A 07/08/2023   Procedure: TRANSESOPHAGEAL ECHOCARDIOGRAM;  Surgeon: Parke Poisson, MD;  Location: [facility] INVASIVE CV LAB;  Service: Cardiovascular;  Laterality: N/A;   Social History:  reports that she has never smoked. She has never been exposed to tobacco smoke. She has never used smokeless tobacco. She reports that she does not drink alcohol and  does not use drugs.  No Known Allergies  Family History  Problem Relation Age of Onset   Alcohol abuse Paternal Grandfather    Huntington's disease Maternal Grandfather    Cancer Father        pancreatic   Diabetes Mother        Type II DM   Colon cancer Neg Hx    Colon polyps Neg Hx     Prior to Admission medications   Medication Sig Start Date End Date Taking? Authorizing Provider  acetaminophen (TYLENOL) 325 MG tablet Take 325 mg by mouth every 6 (six) hours as needed for mild pain (pain score 1-3) or moderate pain (pain score 4-6). Patient not taking: Reported on 07/04/2023 12/24/21   [provider]  B Complex-C-Folic Acid (DIALYVITE 800) 0.8 MG TABS Take 1 tablet by mouth daily. 11/16/21   [provider]  calcitRIOL (ROCALTROL) 0.5 MCG capsule Take 1 mcg by mouth daily.    [provider]  ceFAZolin (ANCEF) 1-4 GM/50ML-% SOLN Inject 100 mLs (2 g total) into the vein every dialysis for up to 27 days. 07/13/23 08/09/23  Dorcas Carrow, MD  cetirizine HCl (ZYRTEC) 5 MG/5ML SOLN TAKE  1.25 ML TWICE A DAY AS NEEDED FOR A RUNNY NOSE OR ITCH Patient not taking: Reported on 07/04/2023 05/09/23   Marcelyn Bruins, MD  Continuous Glucose Receiver (DEXCOM G7 RECEIVER) DEVI by Does not apply route.    [provider]  Continuous Glucose Sensor (DEXCOM G7 SENSOR) MISC by Does not apply route.    [provider]  gentamicin cream (GARAMYCIN) 0.1 % Apply 1 Application topically daily.    [provider]  Insulin Aspart, w/Niacinamide, (FIASP) 100 UNIT/ML SOLN Inject 5-8 Units as directed with breakfast, with lunch, and with evening meal. Sliding Scale    [provider]  medroxyPROGESTERone (PROVERA) 10 MG tablet Take provera 10 mg for 10 days every 3 months 11/16/22   Cyril Mourning A, NP  midodrine (PROAMATINE) 5 MG tablet Take 1 tablet (5 mg total) by mouth 3 (three) times daily with meals. 07/13/23   Dorcas Carrow, MD   ondansetron (ZOFRAN-ODT) 4 MG disintegrating tablet Take 1 tablet (4 mg total) by mouth every 8 (eight) hours as needed for nausea or vomiting. 06/08/23   Pollyann Savoy, MD  pantoprazole (PROTONIX) 40 MG tablet Take 1 tablet (40 mg total) by mouth 2 (two) times daily before a meal. 03/22/23 03/16/24  Aida Raider, NP  rosuvastatin (CRESTOR) 10 MG tablet Take 1 tablet (10 mg total) by mouth daily. 03/15/23   Cook, Jayce G, DO  SSD 1 % cream APPLY CREAM TO WOUND DAILY AND COVER WITH GAUZE DRESSING 04/01/23   McCaughan, Dia D, DPM  sucroferric oxyhydroxide (VELPHORO) 500 MG chewable tablet Chew 1,000 mg by mouth 3 (three) times daily with meals. 11/16/21   [provider]  TRESIBA FLEXTOUCH 100 UNIT/ML FlexTouch Pen Inject 15 Units into the skin at bedtime. 06/23/23   Dani Gobble, NP  VENTOLIN HFA 108 (90 Base) MCG/ACT inhaler Inhale 2 puffs into the lungs every 4 (four) hours as needed for wheezing or shortness of breath. 2 puffs once every 4 hours as needed for cough, wheeze, shortness of breath and chest tightness. May use 2 puffs 5 to 15 minutes before activity to decrease cough or wheeze. 03/24/23   Marcelyn Bruins, MD  Vitamin D, Ergocalciferol, (DRISDOL) 1.25 MG (50000 UNIT) CAPS capsule Take 50,000 Units by mouth every 7 (seven) days.    [provider]    Physical Exam: Vitals:   07/17/23 0981 07/17/23 0338 07/17/23 0731 07/17/23 1248  BP: (!) 147/90  108/75 124/84  Pulse: 93  (!) 104 (!) 106  Resp: 16  16 16   Temp:  98.4 F (36.9 C) 98.7 F (37.1 C) (!) 96.6 F (35.9 C)  TempSrc:  Oral Oral Oral  SpO2: 100%  99% 100%  Weight:      Height:       General:  Appears calm and comfortable and is in NAD Eyes:  PERRL, EOMI, normal lids, iris ENT:  grossly normal hearing, lips & tongue, mmm; appropriate dentition Neck:  no LAD, masses or thyromegaly; no carotid bruits Cardiovascular:  RRR, no m/r/g. 3+ LE edema to below the knee  Respiratory:    CTA bilaterally with no wheezes/rales/rhonchi.  Normal respiratory effort. Abdomen:  soft, NT, ND, NABS Back:   normal alignment, no CVAT Skin:  no rash or induration seen on limited exam Musculoskeletal:  grossly normal tone BUE/BLE, good ROM, no bony abnormality Lower extremity:  Limited foot exam with no ulcerations.  2+ distal pulses. Psychiatric:  grossly normal mood and affect, speech  fluent and appropriate, AOx3 Neurologic:  CN 2-12 grossly intact, moves all extremities in coordinated fashion, sensation intact   Radiological Exams on Admission: Independently reviewed - see discussion in A/P where applicable  CT ABDOMEN PELVIS WO CONTRAST Result Date: 07/17/2023 CLINICAL DATA:  Dizziness and diarrhea. EXAM: CT ABDOMEN AND PELVIS WITHOUT CONTRAST TECHNIQUE: Multidetector CT imaging of the abdomen and pelvis was performed following the standard protocol without IV contrast. RADIATION DOSE REDUCTION: This exam was performed according to the departmental dose-optimization program which includes automated exposure control, adjustment of the mA and/or kV according to patient size and/or use of iterative reconstruction technique. COMPARISON:  July 04, 2023 FINDINGS: Lower chest: No acute abnormality. Hepatobiliary: No focal liver abnormality is seen. The gallbladder appears to be contracted without evidence of gallstones, gallbladder wall thickening, or biliary dilatation. Pancreas: There is extensive pancreatic atrophy with numerous punctate calcifications seen scattered throughout the pancreas. Spleen: Normal in size without focal abnormality. Adrenals/Urinary Tract: Adrenal glands are unremarkable. Kidneys are normal, without obstructing renal calculi, focal lesion, or hydronephrosis. A 2 mm nonobstructing renal calculus is seen within the lower pole of the right kidney. There is mild diffuse urinary bladder wall thickening. Stomach/Bowel: Stomach is within normal limits. Appendix appears normal.  No evidence of bowel dilatation. Numerous markedly thickened and inflamed loops of small bowel are seen throughout the abdomen and pelvis. The majority of the large bowel is poorly distended, however moderate to markedly thickened and inflamed mid and distal ascending colon is suspected. Vascular/Lymphatic: Aortic atherosclerosis. Subcentimeter para-aortic lymph nodes are seen. Reproductive: The uterus is not clearly visualized. The bilateral adnexa are unremarkable. Other: Mild to moderate severity anasarca is seen throughout the abdominal and pelvic walls. A small amount of pelvic free fluid is noted. Musculoskeletal: No acute or significant osseous findings. IMPRESSION: 1. Marked severity enterocolitis. 2. Mild diffuse urinary bladder wall thickening which may represent sequelae associated with cystitis. Correlation with urinalysis is recommended. 3. Small amount of pelvic free fluid. 4. Mild to moderate severity anasarca. 5. Findings consistent with chronic pancreatitis. 6. 2 mm nonobstructing right renal calculus. 7. Aortic atherosclerosis. Aortic Atherosclerosis (ICD10-I70.0). Electronically Signed   By: Aram Candela M.D.   On: 07/17/2023 03:46    EKG: Independently reviewed.  Sinus tachy with rate 105; nonspecific ST changes with no evidence of acute ischemia   Labs on Admission: I have personally reviewed the available labs and imaging studies at the time of the admission.  Pertinent labs:   wbc: 14.1,  hgb: 8.0,  potassium: 3.2,  creatinine: 4.32,   Assessment and Plan: Principal Problem:   Colitis Active Problems:   MSSA bacteremia   Hypokalemia   ESRD (end stage renal disease) ([name])   Dizziness   Uncontrolled type 1 diabetes mellitus with hyperglycemia ([name])   Hyperlipidemia   GERD (gastroesophageal reflux disease)   Anemia in ESRD (end-stage renal disease) ([name])   Pressure injury of skin    Assessment and Plan: * Colitis 28 year old presenting with worsening diarrhea  and left lateral abdominal pain found to have severe enterocolitis on CT abdomen/pelvis.  -obs to tele  -was admitted 2/24-3/5 for septic shock in setting of PD catheter associated peritonitis and MSSA bacteremia.  -Had diarrhea at this time and her C.diff and stool studies were negative 07/10/23 -she has no fever/chills, normal lactic acid. She does meet SIRS with tachycardia and leukocytosis  -abdominal exam benign -C.Diff and GI stool PCR sent again. C diff negative  -enteric precautions  -continue  Ancef. Hold any further medication until stool study results  MSSA bacteremia Admitted to ICU 2/24-07/13/23 for septic shock in setting of PD catheter associated peritonitis and MSSA bacteremia. Required levophed for one day. Transferred to [facility] on 2/27.  3/4 PD catheter removed by [facility].  TEE:  no endocarditis.  Recommended IV ancef with HD x 4 weeks (EOT 4/1) Pharm consult to continue ancef while inpatient   ESRD (end stage renal disease) ([name]) Was on PD HD, but PD catheter removed on 3/4 and started on HD Last HD session (she states) was Friday 3/7 and told her blood pressure was too low Nephrology consulted and appreciate    Hypokalemia Check magnesium Oral repletion low dose  Trend   Dizziness Likely secondary to othostatic hypotension Orthostatic vitals positive and she has with position changes Small 250cc IVF bolus  TED hose Continue midodrine, may need dosage adjustment   Uncontrolled type 1 diabetes mellitus with hyperglycemia ([name]) A1C of 7.2 Continue long actin insulin 15 units at bedtime SSI and accuchecks QAC/HS   Hyperlipidemia Continue crestor 10mg  daily   Anemia in ESRD (end-stage renal disease) ([name]) Baseline 9-10, monitor   GERD (gastroesophageal reflux disease) Continue protonix BID   Pressure injury of skin Stage 2     Advance Care Planning:   Code Status: Full Code   Consults: nephrology   DVT Prophylaxis: heparin   Family Communication: none    Severity of Illness: The appropriate patient status for this patient is OBSERVATION. Observation status is judged to be reasonable and necessary in order to provide the required intensity of service to ensure the patient's safety. The patient's presenting symptoms, physical exam findings, and initial radiographic and laboratory data in the context of their medical condition is felt to place them at decreased risk for further clinical deterioration. Furthermore, it is anticipated that the patient will be medically stable for discharge from the hospital within 2 midnights of admission.   Author: Orland Mustard, MD 07/17/2023 2:36 PM  For on call review [email or web address].

## 2023-07-17 NOTE — Plan of Care (Deleted)
   Problem: Fluid Volume: Goal: Ability to maintain a balanced intake and output will improve Outcome: Progressing   Problem: Activity: Goal: Risk for activity intolerance will decrease Outcome: Progressing

## 2023-07-17 NOTE — Assessment & Plan Note (Signed)
 Baseline 9-10, monitor

## 2023-07-18 ENCOUNTER — Ambulatory Visit: Payer: 59 | Admitting: Podiatry

## 2023-07-18 DIAGNOSIS — I951 Orthostatic hypotension: Secondary | ICD-10-CM | POA: Diagnosis present

## 2023-07-18 DIAGNOSIS — N2581 Secondary hyperparathyroidism of renal origin: Secondary | ICD-10-CM | POA: Diagnosis present

## 2023-07-18 DIAGNOSIS — E109 Type 1 diabetes mellitus without complications: Secondary | ICD-10-CM | POA: Diagnosis not present

## 2023-07-18 DIAGNOSIS — E876 Hypokalemia: Secondary | ICD-10-CM | POA: Diagnosis present

## 2023-07-18 DIAGNOSIS — E10649 Type 1 diabetes mellitus with hypoglycemia without coma: Secondary | ICD-10-CM | POA: Diagnosis present

## 2023-07-18 DIAGNOSIS — K861 Other chronic pancreatitis: Secondary | ICD-10-CM

## 2023-07-18 DIAGNOSIS — Z992 Dependence on renal dialysis: Secondary | ICD-10-CM | POA: Diagnosis not present

## 2023-07-18 DIAGNOSIS — K652 Spontaneous bacterial peritonitis: Secondary | ICD-10-CM

## 2023-07-18 DIAGNOSIS — I120 Hypertensive chronic kidney disease with stage 5 chronic kidney disease or end stage renal disease: Secondary | ICD-10-CM | POA: Diagnosis present

## 2023-07-18 DIAGNOSIS — Z681 Body mass index (BMI) 19 or less, adult: Secondary | ICD-10-CM | POA: Diagnosis not present

## 2023-07-18 DIAGNOSIS — E1043 Type 1 diabetes mellitus with diabetic autonomic (poly)neuropathy: Secondary | ICD-10-CM | POA: Diagnosis present

## 2023-07-18 DIAGNOSIS — K219 Gastro-esophageal reflux disease without esophagitis: Secondary | ICD-10-CM | POA: Diagnosis present

## 2023-07-18 DIAGNOSIS — R933 Abnormal findings on diagnostic imaging of other parts of digestive tract: Secondary | ICD-10-CM | POA: Diagnosis not present

## 2023-07-18 DIAGNOSIS — D631 Anemia in chronic kidney disease: Secondary | ICD-10-CM | POA: Diagnosis present

## 2023-07-18 DIAGNOSIS — B9561 Methicillin susceptible Staphylococcus aureus infection as the cause of diseases classified elsewhere: Secondary | ICD-10-CM | POA: Diagnosis present

## 2023-07-18 DIAGNOSIS — E43 Unspecified severe protein-calorie malnutrition: Secondary | ICD-10-CM | POA: Diagnosis present

## 2023-07-18 DIAGNOSIS — E1065 Type 1 diabetes mellitus with hyperglycemia: Secondary | ICD-10-CM | POA: Diagnosis present

## 2023-07-18 DIAGNOSIS — N186 End stage renal disease: Secondary | ICD-10-CM | POA: Diagnosis present

## 2023-07-18 DIAGNOSIS — E1042 Type 1 diabetes mellitus with diabetic polyneuropathy: Secondary | ICD-10-CM | POA: Diagnosis present

## 2023-07-18 DIAGNOSIS — E785 Hyperlipidemia, unspecified: Secondary | ICD-10-CM | POA: Diagnosis present

## 2023-07-18 DIAGNOSIS — R7881 Bacteremia: Secondary | ICD-10-CM | POA: Diagnosis present

## 2023-07-18 DIAGNOSIS — R5381 Other malaise: Secondary | ICD-10-CM | POA: Diagnosis not present

## 2023-07-18 DIAGNOSIS — I700 Atherosclerosis of aorta: Secondary | ICD-10-CM | POA: Diagnosis present

## 2023-07-18 DIAGNOSIS — K529 Noninfective gastroenteritis and colitis, unspecified: Secondary | ICD-10-CM | POA: Diagnosis present

## 2023-07-18 DIAGNOSIS — Z79899 Other long term (current) drug therapy: Secondary | ICD-10-CM | POA: Diagnosis not present

## 2023-07-18 DIAGNOSIS — I959 Hypotension, unspecified: Secondary | ICD-10-CM | POA: Diagnosis not present

## 2023-07-18 DIAGNOSIS — E8770 Fluid overload, unspecified: Secondary | ICD-10-CM | POA: Diagnosis present

## 2023-07-18 DIAGNOSIS — Z794 Long term (current) use of insulin: Secondary | ICD-10-CM | POA: Diagnosis not present

## 2023-07-18 DIAGNOSIS — A4901 Methicillin susceptible Staphylococcus aureus infection, unspecified site: Secondary | ICD-10-CM

## 2023-07-18 DIAGNOSIS — E1022 Type 1 diabetes mellitus with diabetic chronic kidney disease: Secondary | ICD-10-CM | POA: Diagnosis present

## 2023-07-18 LAB — COMPREHENSIVE METABOLIC PANEL
ALT: <5 U/L (ref 0–44)
AST: 19 U/L (ref 15–41)
Albumin: <1.5 g/dL — ABNORMAL LOW (ref 3.5–5.0)
Alkaline Phosphatase: 89 U/L (ref 38–126)
Anion gap: 7 (ref 5–15)
BUN: 19 mg/dL (ref 6–20)
CO2: 23 mmol/L (ref 22–32)
Calcium: 7.8 mg/dL — ABNORMAL LOW (ref 8.9–10.3)
Chloride: 109 mmol/L (ref 98–111)
Creatinine, Ser: 6.07 mg/dL — ABNORMAL HIGH (ref 0.44–1.00)
GFR, Estimated: 9 mL/min — ABNORMAL LOW (ref >60)
Glucose, Bld: 52 mg/dL — ABNORMAL LOW (ref 70–99)
Potassium: 3.6 mmol/L (ref 3.5–5.1)
Sodium: 139 mmol/L (ref 135–145)
Total Bilirubin: 0.4 mg/dL (ref 0.0–1.2)
Total Protein: 4.8 g/dL — ABNORMAL LOW (ref 6.5–8.1)

## 2023-07-18 LAB — GASTROINTESTINAL PANEL BY PCR, STOOL (REPLACES STOOL CULTURE)

## 2023-07-18 LAB — GLUCOSE, CAPILLARY
Glucose-Capillary: 67 mg/dL — ABNORMAL LOW (ref 70–99)
Glucose-Capillary: 53 mg/dL — ABNORMAL LOW (ref 70–99)
Glucose-Capillary: 168 mg/dL — ABNORMAL HIGH (ref 70–99)
Glucose-Capillary: 24 mg/dL — CL (ref 70–99)
Glucose-Capillary: 129 mg/dL — ABNORMAL HIGH (ref 70–99)
Glucose-Capillary: 82 mg/dL (ref 70–99)
Glucose-Capillary: 37 mg/dL — CL (ref 70–99)
Glucose-Capillary: 110 mg/dL — ABNORMAL HIGH (ref 70–99)
Glucose-Capillary: 95 mg/dL (ref 70–99)
Glucose-Capillary: 197 mg/dL — ABNORMAL HIGH (ref 70–99)

## 2023-07-18 LAB — CBC
HCT: 24.5 % — ABNORMAL LOW (ref 36.0–46.0)
Hemoglobin: 7.9 g/dL — ABNORMAL LOW (ref 12.0–15.0)
MCH: 29.3 pg (ref 26.0–34.0)
MCHC: 32.2 g/dL (ref 30.0–36.0)
MCV: 90.7 fL (ref 80.0–100.0)
Platelets: 214 10*3/uL (ref 150–400)
RBC: 2.7 MIL/uL — ABNORMAL LOW (ref 3.87–5.11)
RDW: 20.7 % — ABNORMAL HIGH (ref 11.5–15.5)
WBC: 11.9 10*3/uL — ABNORMAL HIGH (ref 4.0–10.5)
nRBC: 0.3 % — ABNORMAL HIGH (ref 0.0–0.2)

## 2023-07-18 MED ORDER — PROSOURCE PLUS PO LIQD
30.0000 mL | Freq: Two times a day (BID) | ORAL | Status: DC
  Administered 2023-07-18 – 2023-07-20 (×4): 30 mL via ORAL
  Filled 2023-07-18 (×4): qty 30

## 2023-07-18 MED ORDER — DEXTROSE 50 % IV SOLN
INTRAVENOUS | Status: AC
  Administered 2023-07-18: 25 g via INTRAVENOUS
  Filled 2023-07-18: qty 50

## 2023-07-18 MED ORDER — HEPARIN SODIUM (PORCINE) 1000 UNIT/ML DIALYSIS: 1000.0000 [IU] | INTRAMUSCULAR | Status: DC | PRN

## 2023-07-18 MED ORDER — LIDOCAINE HCL (PF) 1 % IJ SOLN: 5.0000 mL | INTRAMUSCULAR | Status: DC | PRN

## 2023-07-18 MED ORDER — ANTICOAGULANT SODIUM CITRATE 4% (200MG/5ML) IV SOLN: 5.0000 mL | Status: DC | PRN

## 2023-07-18 MED ORDER — DEXTROSE 50 % IV SOLN
25.0000 g | INTRAVENOUS | Status: AC
  Administered 2023-07-18: 25 g via INTRAVENOUS
  Filled 2023-07-18: qty 50

## 2023-07-18 MED ORDER — BOOST / RESOURCE BREEZE PO LIQD CUSTOM
1.0000 | Freq: Two times a day (BID) | ORAL | Status: DC
  Administered 2023-07-19 – 2023-07-20 (×2): 1 via ORAL

## 2023-07-18 MED ORDER — NEPRO/CARBSTEADY PO LIQD: 237.0000 mL | ORAL | Status: DC | PRN

## 2023-07-18 MED ORDER — ALTEPLASE 2 MG IJ SOLR: 2.0000 mg | Freq: Once | INTRAMUSCULAR | Status: DC | PRN

## 2023-07-18 MED ORDER — PENTAFLUOROPROP-TETRAFLUOROETH EX AERO: 1.0000 | INHALATION_SPRAY | CUTANEOUS | Status: DC | PRN

## 2023-07-18 MED ORDER — LIDOCAINE-PRILOCAINE 2.5-2.5 % EX CREA: 1.0000 | TOPICAL_CREAM | CUTANEOUS | Status: DC | PRN

## 2023-07-18 NOTE — Consult Note (Addendum)
 Referring Provider: [email or web address] Dahal Primary Care Physician:  [name], DO Primary Gastroenterologist:  Dr. Earnest Bailey   Reason for Consultation:  Diarrhea, enterocolitis   HPI: Lindsey Murillo is a 28 y.o. female with past medical history of asthma, hypertension, autoimmune thyroiditis, goiter, diabetes mellitus type 1, peripheral neuropathy, sacral osteomyelitis's 2021, ESRD on HD every M/W/F, chronic anemia, GERD chronic diarrhea and chronic pancreatitis with stable pseudocyst.   She was recently admitted 2/24 - 07/13/2023 with sepsis secondary to peritoneal dialysis catheter infection. She was septic with peritonitis secondary to MSSA bacteremia and was initially admitted to the ICU on Levophed and IV antibiotics. Her PD catheter was removed 07/12/2023. She was switched back to hemodialysis via right upper extremity fistula. Her clinical status improved and she was discharged home on IV Ancef x 4 weeks with HD until 08/09/2023.   She presented to [facility] ED 07/16/2023 with persistent diarrhea associated lightheadedness and dizziness.  Labs in the ED showed a WBC count of 14.1 down from 17.7 five days ago.  Hemoglobin 8.0 down from 9.2.  Platelet 172.  Sodium 136.  Potassium 3.2.  BUN 14.  Creatinine 4.32.  Albumin<  1.5.  Total bili 0.5.  Alk phos 96.  AST 20.  ALT < 5.  Beta-hCG 3.  Labs 07/17/2023: GI pathogen panel negative.  C. difficile antigen/toxin negative.  TSH 2.185.  Lipase 31.  Magnesium 1.9.  CTAP without contrast 07/17/2023: Identified marked severity enterocolitis, mild diffuse urinary bladder wall thickening, small amount of pelvic free fluid, mild to moderate anasarca, 2 mm nonobstructing right kidney stone, evidence of chronic pancreatitis and aortic atherosclerosis.  Labs 07/18/2023: WBC 11.9.  Hemoglobin 7.9.  Hematocrit 24.5.  Platelet 214.  Sodium 139.  Potassium 3.6.  BUN 19.  Creatinine 6.07 normal LFTs.  She endorses having on and off diarrhea for at  least the past year.  She describes passing nonbloody watery to mud like bowel movements 3-7 times daily approximately 3 to 4 days weekly.  She passed 5 episodes of diarrhea yesterday and 5 episodes thus far today.  Her stools are very dark brown to black since starting oral iron many years ago. She has left upper and left lower abdominal pain which has persisted since her PD catheter was removed.  No nausea or vomiting.  No GERD symptoms or dysphagia on Protonix 40 mg bid at home. She remains on Ancef on dialysis days since she was discharged from her prior hospitalization as noted above. No known history of C. difficile.  Non-smoker.  Denies alcohol use.  No drug use.  She is followed by Dr. Marletta Lor gastroenterologist in [place]. She underwent a colonoscopy 02/09/2022 by Dr. Marletta Lor due to having chronic diarrhea which showed internal hemorrhoids otherwise was normal, no evidence of colitis/IBD.  She underwent an EGD on the same date which showed mild gastritis.  Gastric biopsies were negative for H. pylori.  She has a history of stable chronic pancreatitis with pancreatic pseudocyst and pancreatic insufficiency. It was previously noted her diarrhea improved when she was on Creon. She cannot recall when she stopped taking Creon. No NSAID use.  No known family history of IBD or colorectal cancer.  She believes her father had prostate cancer. She underwent dialysis earlier today via RUE fistula.   GI PROCEDURES:  EGD 03/08/2022: -Gastritis s/p biopsy -Normal duodenum -Biopsy negative for H. pylori, slight chronic inflammation. -Use pantoprazole 40 mg once daily -Avoid NSAIDs.   Colonoscopy 02/09/2022:  - Non-bleeding  internal hemorrhoids.  - The examined portion of the ileum was normal.  - The examination was otherwise normal.  - No specimens collected.- -Repeat colonoscopy at age 84 or sooner  EGD 2/172021: - Normal esophagus. Status post Maloney dilation.  - Focal abnormality of the gastric  mucosa likely result of trauma (heaving).  Status post biopsy  EGD 08/27/2016: - MILD Gastritis.  - NO OBVIOUS SOURCE FOR DYSPEPSIA/DIARRHEA IDENTIFIED  ECHO TEE 07/08/2023: Prominent lateral and apical LV trabeculations. Left ventricular ejection fraction, by estimation, is 55 to 60%. The left ventricle has normal function. 1. 2. Right ventricular systolic function is normal. The right ventricular size is normal. No left atrial/left atrial appendage thrombus was detected. The LAA emptying velocity was 60 cm/s. 3. 4. A small pericardial effusion is present. 5. The mitral valve is grossly normal. Trivial mitral valve regurgitation. The aortic valve is tricuspid. Aortic valve regurgitation is not visualized. No aortic stenosis is present. 6. 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 Agitated saline contrast bubble study was positive with shunting observed within 3-6 cardiac cycles suggestive of interatrial shunt. 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 10/21/2021   Diabetic autonomic neuropathy ([name])    Diabetic peripheral neuropathy ([name])    Environmental allergies    Goiter    History of blood transfusion    Hypertension    no meds currently   Hypoglycemia associated with diabetes ([name])    Musculoskeletal pain 04/20/2022   Tachycardia    Thyroiditis, autoimmune    Type 1 diabetes mellitus in patient age 56-19 years with HbA1C goal below 7.5     Past Surgical History:  Procedure Laterality Date   AV FISTULA PLACEMENT Right 12/08/2021   Procedure: ARTERIOVENOUS  FISTULA CREATION VERSUS GRAFT;  Surgeon: Larina Earthly, MD;  Location: [facility] ORS;  Service: Vascular;  Laterality: Right;   BASCILIC VEIN TRANSPOSITION Right 01/12/2022   Procedure: RIGHT ARM  SECOND STAGE BASILIC VEIN TRANSPOSITION;  Surgeon: Larina Earthly, MD;  Location: [facility] ORS;  Service: Vascular;  Laterality: Right;   BIOPSY  08/27/2016   Procedure: BIOPSY;  Surgeon: West Bali, MD;  Location: [facility] SU[facility];  Service: Endoscopy;;  duodenum; gastric   BIOPSY  06/27/2019   Procedure: BIOPSY;  Surgeon: Corbin Ade, MD;  Location: [facility];  Service: Endoscopy;;   BIOPSY  02/09/2022   Procedure: BIOPSY;  Surgeon: Lanelle Bal, DO;  Location: [facility];  Service: Endoscopy;;   BREAST CYST EXCISION Right 11/12/2021   Procedure: RIGHT BREAST ABSCESS INCISION AND DRAINAGE;  Surgeon: Emelia Loron, MD;  Location: [facility] OR;  Service: General;  Laterality: Right;  LMA   CAPD REMOVAL N/A 07/12/2023   Procedure: REMOVAL, CATHETER, CAPD;  Surgeon: Victorino Sparrow, MD;  Location: [facility] OR;  Service: Vascular;  Laterality: N/A;   COLONOSCOPY     COLONOSCOPY WITH PROPOFOL N/A 02/09/2022   Procedure: COLONOSCOPY WITH PROPOFOL;  Surgeon: Lanelle Bal, DO;  Location: [facility];  Service: Endoscopy;  Laterality: N/A;  7:30am, asa 3, dialysis pt   ESOPHAGOGASTRODUODENOSCOPY N/A 08/27/2016   Dr. Darrick Penna: mild gastritis. Negative celiac. No obvious source for dyspepsia/diarrhea   ESOPHAGOGASTRODUODENOSCOPY (EGD) WITH PROPOFOL N/A 06/27/2019   rourk: Focal  abnormality of the gastric mucosa likely due to trauma (heaving).  Biopsy showed mild gastritis, negative for H. pylori.  Esophageal dilation for history of dysphagia but normal-appearing esophagus.   ESOPHAGOGASTRODUODENOSCOPY (EGD) WITH PROPOFOL N/A 02/09/2022   Procedure: ESOPHAGOGASTRODUODENOSCOPY (EGD) WITH PROPOFOL;  Surgeon: Lanelle Bal, DO;  Location: [facility];  Service: Endoscopy;  Laterality: N/A;   IR FLUORO GUIDE CV LINE RIGHT  10/13/2021   IR FLUORO GUIDE CV LINE RIGHT  10/19/2021   IR US GUIDE VASC ACCESS RIGHT  10/13/2021   IRRIGATION AND DEBRIDEMENT ABSCESS N/A 10/04/2021   Procedure: IRRIGATION AND DEBRIDEMENT  NECK ABSCESS;  Surgeon: Axel Filler, MD;  Location: [facility] OR;  Service: General;  Laterality: N/A;   TRANSESOPHAGEAL ECHOCARDIOGRAM (CATH LAB) N/A 07/08/2023   Procedure: TRANSESOPHAGEAL ECHOCARDIOGRAM;  Surgeon: Parke Poisson, MD;  Location: [facility] INVASIVE CV LAB;  Service: Cardiovascular;  Laterality: N/A;    Prior to Admission medications   Medication Sig Start Date End Date Taking? Authorizing Provider  B Complex-C-Folic Acid (DIALYVITE 800) 0.8 MG TABS Take 1 tablet by mouth daily. 11/16/21  Yes [provider]  calcitRIOL (ROCALTROL) 0.5 MCG capsule Take 1 mcg by mouth daily.   Yes [provider]  ceFAZolin (ANCEF) 1-4 GM/50ML-% SOLN Inject 100 mLs (2 g total) into the vein every dialysis for up to 27 days. 07/13/23 08/09/23 Yes Ghimire, Lyndel Safe, MD  Insulin Aspart, w/Niacinamide, (FIASP) 100 UNIT/ML SOLN Inject 5-8 Units as directed with breakfast, with lunch, and with evening meal. Sliding Scale   Yes [provider]  medroxyPROGESTERone (PROVERA) 10 MG tablet Take provera 10 mg for 10 days every 3 months 11/16/22  Yes Cyril Mourning A, NP  midodrine (PROAMATINE) 5 MG tablet Take 1 tablet (5 mg total) by mouth 3 (three) times daily with meals. 07/13/23  Yes Dorcas Carrow, MD  pantoprazole (PROTONIX) 40 MG tablet Take 1 tablet (40 mg total) by mouth 2 (two) times daily before a meal. 03/22/23 03/16/24 Yes Mahon, Courtney L, NP  rosuvastatin (CRESTOR) 10 MG tablet Take 1 tablet (10 mg total) by mouth daily. 03/15/23  Yes Cook, Verdis Frederickson, DO  sucroferric oxyhydroxide (VELPHORO) 500 MG chewable tablet Chew 1,000 mg by mouth 3 (three) times daily with meals. 11/16/21  Yes [provider]  TRESIBA FLEXTOUCH 100 UNIT/ML FlexTouch Pen Inject 15 Units into the skin at bedtime. 06/23/23  Yes Reardon, Freddi Starr, NP  VENTOLIN HFA 108 (90 Base) MCG/ACT inhaler Inhale 2 puffs into the lungs every 4 (four) hours as needed for wheezing or shortness of breath. 2 puffs once every 4  hours as needed for cough, wheeze, shortness of breath and chest tightness. May use 2 puffs 5 to 15 minutes before activity to decrease cough or wheeze. 03/24/23  Yes Padgett, Pilar Grammes, MD  acetaminophen (TYLENOL) 325 MG tablet Take 325 mg by mouth every 6 (six) hours as needed for mild pain (pain score 1-3) or moderate pain (pain score 4-6). Patient not taking: Reported on 07/04/2023 12/24/21   [provider]  cetirizine HCl (ZYRTEC) 5 MG/5ML SOLN TAKE 1.25 ML TWICE A DAY AS NEEDED FOR A RUNNY NOSE OR ITCH Patient not taking: Reported on 07/17/2023 05/09/23   Marcelyn Bruins, MD  Continuous Glucose Receiver (DEXCOM G7 RECEIVER) DEVI by Does not apply route.    [provider]  Continuous Glucose Sensor (DEXCOM G7 SENSOR) MISC by Does not apply route.    [provider]  gentamicin cream (GARAMYCIN) 0.1 % Apply  1 Application topically daily. Patient not taking: Reported on 07/17/2023    [provider]    Current Facility-Administered Medications  Medication Dose Route Frequency Provider Last Rate Last Admin   (feeding supplement) PROSource Plus liquid 30 mL  30 mL Oral BID BM Tomasa Blase, PA-C   30 mL at 07/18/23 1339   acetaminophen (TYLENOL) tablet 650 mg  650 mg Oral Q6H PRN Orland Mustard, MD       Or   acetaminophen (TYLENOL) suppository 650 mg  650 mg Rectal Q6H PRN Orland Mustard, MD       albuterol (PROVENTIL) (2.5 MG/3ML) 0.083% nebulizer solution 2.5 mg  2.5 mg Nebulization Q4H PRN Orland Mustard, MD       calcitRIOL (ROCALTROL) capsule 1 mcg  1 mcg Oral Daily Orland Mustard, MD   1 mcg at 07/18/23 1340   ceFAZolin (ANCEF) IVPB 1 g/50 mL premix  1 g Intravenous Q24H Francena Hanly, RPH 100 mL/hr at 07/17/23 1838 1 g at 07/17/23 1838   Chlorhexidine Gluconate Cloth 2 % PADS 6 each  6 each Topical Q0600 Delano Metz, MD   6 each at 07/18/23 0523   heparin injection 5,000 Units  5,000 Units Subcutaneous Q8H Orland Mustard,  MD   5,000 Units at 07/18/23 1339   insulin aspart (novoLOG) injection 0-6 Units  0-6 Units Subcutaneous TID WC Orland Mustard, MD       midodrine (PROAMATINE) tablet 5 mg  5 mg Oral TID WC Orland Mustard, MD   5 mg at 07/18/23 1339   multivitamin (RENA-VIT) tablet 1 tablet  1 tablet Oral QHS Orland Mustard, MD   1 tablet at 07/17/23 2144   ondansetron [facility]) tablet 4 mg  4 mg Oral Q6H PRN Orland Mustard, MD       Or   ondansetron [facility]) injection 4 mg  4 mg Intravenous Q6H PRN Orland Mustard, MD       oxyCODONE (Oxy IR/ROXICODONE) immediate release tablet 5 mg  5 mg Oral Q4H PRN Orland Mustard, MD   5 mg at 07/17/23 1857   pantoprazole (PROTONIX) EC tablet 40 mg  40 mg Oral BID Annett Fabian, MD       rosuvastatin (CRESTOR) tablet 10 mg  10 mg Oral QHS Orland Mustard, MD   10 mg at 07/17/23 2143   sucroferric oxyhydroxide (VELPHORO) chewable tablet 1,000 mg  1,000 mg Oral TID WC Orland Mustard, MD   1,000 mg at 07/18/23 1339    Allergies as of 07/16/2023   (No Known Allergies)    Family History  Problem Relation Age of Onset   Alcohol abuse Paternal Grandfather    Huntington's disease Maternal Grandfather    Cancer Father        pancreatic   Diabetes Mother        Type II DM   Colon cancer Neg Hx    Colon polyps Neg Hx     Social History   Socioeconomic History   Marital status: Single    Spouse name: Not on file   Number of children: 0   Years of education: Not on file   Highest education level: High school graduate  Occupational History   Occupation: unemployed  Tobacco Use   Smoking status: Never    Passive exposure: Never   Smokeless tobacco: Never  Vaping Use   Vaping status: Never Used  Substance and Sexual Activity   Alcohol use: No   Drug use: No   Sexual activity:  Not Currently    Birth control/protection: Abstinence, None  Other Topics Concern   Not on file  Social History Narrative   Not on file   Social Drivers of Health   Financial  Resource Strain: Low Risk  (04/28/2023)   Received from [facility]   Overall Financial Resource Strain (CARDIA)    Difficulty of Paying Living Expenses: Not hard at all  Food Insecurity: No Food Insecurity (07/18/2023)   Hunger Vital Sign    Worried About Running Out of Food in the Last Year: Never true    Ran Out of Food in the Last Year: Never true  Transportation Needs: No Transportation Needs (07/18/2023)   PRAPARE - Administrator, Civil Service (Medical): No    Lack of Transportation (Non-Medical): No  Physical Activity: Insufficiently Active (01/14/2023)   Exercise Vital Sign    Days of Exercise per Week: 4 days    Minutes of Exercise per Session: 30 min  Stress: No Stress Concern Present (05/24/2023)   Received from [facility] of Occupational Health - Occupational Stress Questionnaire    Feeling of Stress : Only a little  Social Connections: Moderately Isolated (01/14/2023)   Social Connection and Isolation Panel [NHANES]    Frequency of Communication with Friends and Family: More than three times a week    Frequency of Social Gatherings with Friends and Family: More than three times a week    Attends Religious Services: More than 4 times per year    Active Member of [employer] or Organizations: No    Attends Banker Meetings: Never    Marital Status: Never married  Intimate Partner Violence: Not At Risk (07/18/2023)   Humiliation, Afraid, Rape, and Kick questionnaire    Fear of Current or Ex-Partner: No    Emotionally Abused: No    Physically Abused: No    Sexually Abused: No   Review of Systems: Gen: Denies fever, sweats or chills. No weight loss.  CV: Denies chest pain, palpitations or edema. Resp: Denies cough, shortness of breath of hemoptysis.  GI: See HPI.   GU: On HD. MS: Denies joint pain, muscles aches or weakness. Derm: Denies rash, itchiness, skin lesions or unhealing ulcers. Psych: Denies depression, anxiety, memory  loss or confusion. Heme: Denies easy bruising, bleeding. Neuro:  Denies headaches, dizziness or paresthesias. Endo: DM type I.  Physical Exam: Vital signs in last 24 hours: Temp:  [97.9 F (36.6 C)-98.2 F (36.8 C)] 98.1 F (36.7 C) (03/10 0809) Pulse Rate:  [99-114] 104 (03/10 1207) Resp:  [11-23] 23 (03/10 1207) BP: (94-133)/(55-88) 122/81 (03/10 1207) SpO2:  [99 %-100 %] 100 % (03/10 1207) Weight:  [54.2 kg] 54.2 kg (03/10 1207) Last BM Date : 07/17/23 General: Chronically ill-appearing 28 year old female in no acute distress. Head:  Normocephalic and atraumatic. Eyes:  No scleral icterus. Conjunctiva pink. Ears:  Normal auditory acuity. Nose:  No deformity, discharge or lesions. Mouth:  Dentition intact. No ulcers or lesions.  Neck:  Supple. No lymphadenopathy or thyromegaly.  Lungs: Breath sounds clear throughout. No wheezes, rhonchi or crackles.  Heart: Regular rate and rhythm, no murmurs. Abdomen: Mildly distended. Mild tenderness to the LUQ down to the LLQ without rebound or guarding.  Positive bowel sounds to all 4 quadrants.  Our PD catheter site dry and intact without exudate or erythema. Rectal: Deferred. Musculoskeletal:  Symmetrical without gross deformities.  Pulses:  Normal pulses noted. Extremities: Mild bilateral lower extremity edema. RUE fistula  with positive bruit and thrill. Neurologic:  Alert and  oriented x 4. No focal deficits.  Skin:  Intact without significant lesions or rashes. Psych:  Alert and cooperative. Normal mood and affect.  Intake/Output from previous day: No intake/output data recorded. Intake/Output this shift: Total I/O In: -  Out: 2500 [Other:2500]  Lab Results: Recent Labs    07/16/23 1801 07/18/23 0535  WBC 14.1* 11.9*  HGB 8.0* 7.9*  HCT 24.5* 24.5*  PLT 172 214   BMET Recent Labs    07/16/23 1801 07/18/23 0535  NA 136 139  K 3.2* 3.6  CL 102 109  CO2 25 23  GLUCOSE 101* 52*  BUN 14 19  CREATININE 4.32* 6.07*   CALCIUM 7.7* 7.8*   LFT Recent Labs    07/18/23 0535  PROT 4.8*  ALBUMIN <1.5*  AST 19  ALT <5  ALKPHOS 89  BILITOT 0.4   PT/INR No results for input(s): "LABPROT", "INR" in the last 72 hours. Hepatitis Panel No results for input(s): "HEPBSAG", "HCVAB", "HEPAIGM", "HEPBIGM" in the last 72 hours.  Studies/Results: CT ABDOMEN PELVIS WO CONTRAST Result Date: 07/17/2023 CLINICAL DATA:  Dizziness and diarrhea. EXAM: CT ABDOMEN AND PELVIS WITHOUT CONTRAST TECHNIQUE: Multidetector CT imaging of the abdomen and pelvis was performed following the standard protocol without IV contrast. RADIATION DOSE REDUCTION: This exam was performed according to the departmental dose-optimization program which includes automated exposure control, adjustment of the mA and/or kV according to patient size and/or use of iterative reconstruction technique. COMPARISON:  July 04, 2023 FINDINGS: Lower chest: No acute abnormality. Hepatobiliary: No focal liver abnormality is seen. The gallbladder appears to be contracted without evidence of gallstones, gallbladder wall thickening, or biliary dilatation. Pancreas: There is extensive pancreatic atrophy with numerous punctate calcifications seen scattered throughout the pancreas. Spleen: Normal in size without focal abnormality. Adrenals/Urinary Tract: Adrenal glands are unremarkable. Kidneys are normal, without obstructing renal calculi, focal lesion, or hydronephrosis. A 2 mm nonobstructing renal calculus is seen within the lower pole of the right kidney. There is mild diffuse urinary bladder wall thickening. Stomach/Bowel: Stomach is within normal limits. Appendix appears normal. No evidence of bowel dilatation. Numerous markedly thickened and inflamed loops of small bowel are seen throughout the abdomen and pelvis. The majority of the large bowel is poorly distended, however moderate to markedly thickened and inflamed mid and distal ascending colon is suspected.  Vascular/Lymphatic: Aortic atherosclerosis. Subcentimeter para-aortic lymph nodes are seen. Reproductive: The uterus is not clearly visualized. The bilateral adnexa are unremarkable. Other: Mild to moderate severity anasarca is seen throughout the abdominal and pelvic walls. A small amount of pelvic free fluid is noted. Musculoskeletal: No acute or significant osseous findings. IMPRESSION: 1. Marked severity enterocolitis. 2. Mild diffuse urinary bladder wall thickening which may represent sequelae associated with cystitis. Correlation with urinalysis is recommended. 3. Small amount of pelvic free fluid. 4. Mild to moderate severity anasarca. 5. Findings consistent with chronic pancreatitis. 6. 2 mm nonobstructing right renal calculus. 7. Aortic atherosclerosis. Aortic Atherosclerosis (ICD10-I70.0). Electronically Signed   By: Aram Candela M.D.   On: 07/17/2023 03:46    IMPRESSION/PLAN:  28 year old female admitted with acute on chronic intermittent diarrhea and left sided abdominal pain. On Ancef IV secondary to PD catheter associated MSSA peritonitis/bacteremia. GI pathogen panel and C. difficile antigen/toxin negative.  CTAP without contrast showed identified marked severity enterocolitis.  -IV fluids and pain management per the hospitalist  -Renal diet  -Consider repeat C. difficile testing in a few days -Fecal  calprotectin, pancreatic elastase level, CRP and sed rate in am -Defer endoscopic recommendations to Dr. Tomasa Rand  ESRD, previously on HD then switched to PD x 1 month. She subsequently developed septic shock secondary to PD catheter MSSA peritonitis and MSSA bacteremia therefore her PD catheter was removed and HD was resumed.  Last dialysis session was earlier today. On Ance IV until 4/1.  GERD -Continue Pantoprazole 40 mg p.o. twice daily  Chronic anemia, secondary to ESRD. No overt GI bleeding. Hg 9.2 -> 8.0 -> today Hg 7.9.  -CBC and iron panel in a.m. -Transfuse for  hemoglobin level less than 7 or as needed if symptomatic  History of chronic pancreatitis with pseudocyst. CTAP 3/9 showed evidence of chronic pancreatitis and extensive pancreatic atrophy without pancreatic pseudocyst.  -Consider restarting Creon, might improve diarrhea   DM type I on insulin, poorly controlled    Arnaldo Natal  07/18/2023, 3:59 PM

## 2023-07-18 NOTE — Procedures (Signed)
 Received patient in bed to unit.  Alert and oriented.  Informed consent signed and in chart.   TX duration: 3.5 hours  Patient tolerated well.  Transported back to the room  Alert, without acute distress.  Hand-off given to patient's nurse.   Access used: right fistula Access issues: none   Total UF removed: 2.5 liters   Lu Duffel, RN Kidney Dialysis Unit

## 2023-07-18 NOTE — Plan of Care (Signed)

## 2023-07-18 NOTE — Progress Notes (Signed)
 Initial Nutrition Assessment  DOCUMENTATION CODES:   Severe malnutrition in context of acute illness/injury  INTERVENTION:  Discontinue Nepro d/t patient preference/intolerance Continue 30 ml ProSource Plus BID, each supplement provides 100 kcals and 15 grams protein.   Initiate Boost Breeze po BID, each supplement provides 250 kcal and 9 grams of protein Continue Renal MVI Liberalize patient diet to encourage intake Discussed supplementing PO intake   NUTRITION DIAGNOSIS:  Severe Malnutrition related to acute illness as evidenced by moderate fat depletion, moderate muscle depletion.  GOAL:  Patient will meet greater than or equal to 90% of their needs  MONITOR:  PO intake, Labs, Weight trends, Supplement acceptance  REASON FOR ASSESSMENT:  Consult Poor PO  ASSESSMENT:  Pt was recently admitted to [facility] 2/24 - 3/5 with septic shock 2/2 PTN infection. Has been on PD for approximately one month. PMH: T1DM, asthma, HTN, autoimmune thyroiditis, ESRD-HD, GERD, chronic diarrhea, chronic pancreatitis. She returned to [facility] emergency department w/ acute on chonic intermittent diarrhea w/ L sided abdominal pain.  3/5 discharged from [facility] 3/8 [facility] ED w/ diarrhea-associated lightheadedness/dizziness 3/9 CTAP: enterocolitis, anasarca chronic pancreatitis; C.diff negative   Could not report on a typical diet and just kept stating she eats "whatever she has around the house." This can vary widely. No intake documented for review. She does endorse somewhat decreased appetite in the last two days PTA. Noted hypoglycemia. At baseline, eating three meals per day and does receive modular protein supplement at outpatient dialysis sessions. Not tolerating Nepro. Amicable to transitioning to [employer]. Recommended ProSource mixed in or Boost Breeze s/p administration of ProSource.  Discussed importance of increased calorie and protein intake as it relates to maintaining lean body mass on HD and d/t  multiple, concurrent hospitalizations in recent past. She verbalized understanding of this recommendation.   Admit Weight: 53.2kg Current Weight: 54.2kg EDW: 51kg  Could not report on UBW. Chart review showed current weight is around UBW. Weighed 52.6kg three months ago.   Experiences multiple loose stools per day at baseline. Per GI, could consider restarting Creon to aid in slowing frequency of diarrhea. Pt reports she holds fluid in her lower extremities. Observed upon assessment today. Net UF 2.5L on HD.   Intake/Output Summary (Last 24 hours) at 07/18/2023 1830 Last data filed at 07/18/2023 1207 Gross per 24 hour  Intake --  Output 2500 ml  Net -2500 ml    Net IO Since Admission: -2,500 mL [07/18/23 1830]   Lines/Drains: R AVF + bruit UOP: unmeasured x1  Has required potassium repletion and IV dextrose. For this reason, recommend liberalizing diet. Nephrology PA-C amicable to this.  Meds: IV ABX, Imodium, calcitriol, renal MVI, pantoprazole, SSI  Labs:  Na+ [phone] (wdl) K+ 3.2>3.6 (wdl) CBGs 52-108 x72 hours A1c 7.2 (06/2023)  NUTRITION - FOCUSED PHYSICAL EXAM:  Flowsheet Row Most Recent Value  Orbital Region Mild depletion  Upper Arm Region Moderate depletion  Thoracic and Lumbar Region Moderate depletion  Buccal Region Mild depletion  Temple Region Mild depletion  Clavicle Bone Region Moderate depletion  Clavicle and Acromion Bone Region Moderate depletion  Scapular Bone Region Moderate depletion  Dorsal Hand Mild depletion  Patellar Region Moderate depletion  Anterior Thigh Region Moderate depletion  Posterior Calf Region Moderate depletion  Edema (RD Assessment) Mild  Hair Reviewed  Eyes Reviewed  Mouth Reviewed  Skin Reviewed  [pockmarks on back and shoulders]  Nails Reviewed    Diet Order:   Diet Order  Diet renal with fluid restriction Fluid consistency: Thin  Diet effective now             EDUCATION NEEDS:   Education needs  have been addressed  Skin:  Skin Assessment: Skin Integrity Issues: Skin Integrity Issues:: Stage II Stage II: sacrum  Last BM:  3/10 - type 6 x2  Height:  Ht Readings from Last 1 Encounters:  07/16/23 5\' 8"  (1.727 m)   Weight:  Wt Readings from Last 1 Encounters:  07/18/23 54.2 kg    BMI:  Body mass index is 18.17 kg/m.  Estimated Nutritional Needs:   Kcal:     Protein:     Fluid:     Myrtie Cruise MS, RD, LDN Registered Dietitian Clinical Nutrition RD Inpatient Contact Info in Amion

## 2023-07-18 NOTE — Progress Notes (Deleted)
 Marland Kitchen

## 2023-07-18 NOTE — Progress Notes (Signed)
  [facility] Progress Note   Subjective: Feels about the same. Endorses some loose stools, nausea. No chest pain, dyspnea. Starting dialysis now.   Objective Vitals:   07/17/23 2006 07/18/23 0518 07/18/23 0809 07/18/23 0811  BP: 100/71 109/80 128/88 133/87  Pulse: 99 (!) 103 (!) 105 (!) 105  Resp: 19 19 15 12   Temp: 98.1 F (36.7 C) 97.9 F (36.6 C) 98.1 F (36.7 C)   TempSrc:      SpO2: 99% 100% 100% 100%  Weight:      Height:       Additional Objective Labs: Basic Metabolic Panel: Recent Labs  Lab 07/12/23 0503 07/13/23 0532 07/16/23 1801 07/18/23 0535  NA 137 133* 136 139  K 4.0 3.5 3.2* 3.6  CL 106 105 102 109  CO2 19* 25 25 23   GLUCOSE 75 102* 101* 52*  BUN 26* 13 14 19   CREATININE 7.92* 5.03* 4.32* 6.07*  CALCIUM 7.3* 7.3* 7.7* 7.8*  PHOS 3.1 2.3*  --   --    CBC: Recent Labs  Lab 07/13/23 0532 07/16/23 1801 07/18/23 0535  WBC 17.7* 14.1* 11.9*  HGB 9.2* 8.0* 7.9*  HCT 27.3* 24.5* 24.5*  MCV 87.2 90.1 90.7  PLT 138* 172 214   Blood Culture    Component Value Date/Time   SDES PERITONEAL DIALYSATE FLUID 07/11/2023 0125   SPECREQUEST NONE 07/11/2023 0125   CULT  07/11/2023 0125    NO GROWTH 3 DAYS Performed at [facility], [address], [place], [place] [address]    REPTSTATUS 07/14/2023 FINAL 07/11/2023 0125     Physical Exam General: Alert, nad  Heart: RRR Lungs: Clear, normal wob Abdomen: soft non-tender  Extremities: 1+ LE edema  Dialysis Access: R AVF +bruit   Medications:  anticoagulant sodium citrate      ceFAZolin (ANCEF) IV 1 g (07/17/23 1838)    calcitRIOL  1 mcg Oral Daily   Chlorhexidine Gluconate Cloth  6 each Topical Q0600   heparin  5,000 Units Subcutaneous Q8H   insulin aspart  0-6 Units Subcutaneous TID WC   insulin glargine  15 Units Subcutaneous QHS   midodrine  5 mg Oral TID WC   multivitamin  1 tablet Oral QHS   pantoprazole  40 mg Oral BID AC   rosuvastatin  10 mg Oral QHS    sucroferric oxyhydroxide  1,000 mg Oral TID WC    Dialysis Orders:  TTS [facility] 4h  B400   51kg  3K bath  AVF   Heparin none - last OP HD 3/07, post wt 55.6kg (4.6kg over) - rocaltrol 2.25 mcg po three times per week - mircera 100 mcg q 2 wks, last 3/07, due 3/21  Assessment/Plan: 1. Enterocolitis -  GI panel neg. Management per primary  2. H/o MSSA peritonitis/bacteremia - PD cathter removed during prior admit. Continue Ancef 2 g q HD until 08/10/23.  3. ESRD - Usual HD TTS. Last HD Friday 3/7. HD off schedule today for volume excess.  4..Chronic hypotension - on midodrine 5 mg TID.  5. Volume - UF 2-2.5L with HD.  6.  Anemia-  Hgb 7.9. Just received ESA as OP. No IV Fe with infection. Follow trends. Transfuse prn.  7. MBD of CKD -  Corr Ca ok. Continue home meds.  8. Nutrition - Add prot supp for low albumin  9. Type 1 DM - insulin per primary   Tomasa Blase PA-C  [facility] 07/18/2023,8:50 AM

## 2023-07-18 NOTE — Inpatient Diabetes Management (Signed)
 Inpatient Diabetes Program Recommendations  AACE/ADA: New Consensus Statement on Inpatient Glycemic Control (2015)  Target Ranges:  Prepandial:   less than 140 mg/dL      Peak postprandial:   less than 180 mg/dL (1-2 hours)      Critically ill patients:  140 - 180 mg/dL   Lab Results  Component Value Date   GLUCAP 37 (LL) 07/18/2023   HGBA1C 7.2 (A) 06/23/2023    Latest Reference Range & Units 07/18/23 02:33 07/18/23 06:12 07/18/23 06:49 07/18/23 07:55 07/18/23 13:10  Glucose-Capillary 70 - 99 mg/dL 981 (H) 24 (LL) 191 (H) 82 37 (LL)  (LL): Data is critically low (H): Data is abnormally high  Diabetes history: DM1 Outpatient Diabetes medications: Tresiba 15 units at bedtime, Fiasp 5-8 units TID with meals Current orders for Inpatient glycemic control: Novolog 0-6 units TID with meals  Inpatient Diabetes Program Recommendations:   Please consider: -Semglee 3 units nightly  Tresiba lasts up to 42 hrs. Normally in patients and with patients with ESRD the [place] lasts longer.

## 2023-07-18 NOTE — Procedures (Signed)
 I was present at this dialysis session. I have reviewed the session itself and made appropriate changes.   Filed Weights   07/16/23 1758  Weight: 53.2 kg    Recent Labs  Lab 07/13/23 0532 07/16/23 1801 07/18/23 0535  NA 133*   < > 139  K 3.5   < > 3.6  CL 105   < > 109  CO2 25   < > 23  GLUCOSE 102*   < > 52*  BUN 13   < > 19  CREATININE 5.03*   < > 6.07*  CALCIUM 7.3*   < > 7.8*  PHOS 2.3*  --   --    < > = values in this interval not displayed.    Recent Labs  Lab 07/13/23 0532 07/16/23 1801 07/18/23 0535  WBC 17.7* 14.1* 11.9*  HGB 9.2* 8.0* 7.9*  HCT 27.3* 24.5* 24.5*  MCV 87.2 90.1 90.7  PLT 138* 172 214    Scheduled Meds:  (feeding supplement) PROSource Plus  30 mL Oral BID BM   calcitRIOL  1 mcg Oral Daily   Chlorhexidine Gluconate Cloth  6 each Topical Q0600   heparin  5,000 Units Subcutaneous Q8H   insulin aspart  0-6 Units Subcutaneous TID WC   midodrine  5 mg Oral TID WC   multivitamin  1 tablet Oral QHS   pantoprazole  40 mg Oral BID AC   rosuvastatin  10 mg Oral QHS   sucroferric oxyhydroxide  1,000 mg Oral TID WC   Continuous Infusions:  anticoagulant sodium citrate      ceFAZolin (ANCEF) IV 1 g (07/17/23 1838)   PRN Meds:.acetaminophen **OR** acetaminophen, albuterol, alteplase, anticoagulant sodium citrate, feeding supplement (NEPRO CARB STEADY), heparin, lidocaine (PF), lidocaine-prilocaine, ondansetron **OR** ondansetron (ZOFRAN) IV, oxyCODONE, pentafluoroprop-tetrafluoroeth   Louie Bun,  MD 07/18/2023, 10:01 AM

## 2023-07-18 NOTE — TOC CM/SW Note (Signed)
 Transition of Care [facility]) - Inpatient Brief Assessment   Patient Details  Name: Lindsey Murillo MRN: [id number] Date of Birth: 1995-10-27  Transition of Care [facility]) CM/SW Contact:    Harriet Masson, RN Phone Number: 07/18/2023, 1:32 PM   Clinical Narrative:   Patient admitted for Colitis and has MSSA bacteremia-IV ancef with HD x 4 weeks (EOT 4/1). TOC following for needs.  Transition of Care Asessment: Insurance and Status: Insurance coverage has been reviewed Patient has primary care physician: Yes Home environment has been reviewed: sasfe to discharge home Prior level of function:: independent Prior/Current Home Services: No current home services Social Drivers of Health Review: SDOH reviewed no interventions necessary Readmission risk has been reviewed: Yes Transition of care needs: no transition of care needs at this time

## 2023-07-18 NOTE — Progress Notes (Signed)
 PROGRESS NOTE  Lindsey Murillo  DOB: Sep 17, 1995  PCP: [name], DO ZOX:[id number]  DOA: 07/16/2023  LOS: 0 days  Hospital Day: 3  Brief narrative: Lindsey Murillo is a 28 y.o. female with PMH significant for ESRD on HD, DM 1, HTN, chronic anemia, diabetic neuropathy, autoimmune thyroiditis, goiter, acanthosis nigricans. 3/8, patient presented to the ED with complaint of diarrhea, low blood pressure, dizziness for 2 days.  She reports diarrhea started she was discharged from the hospital on March 5. Recently hospitalized 2/24 to 3/5 for septic shock secondary to PD catheter associated peritonitis and MSSA bacteremia, TEE negative for endocarditis.  PD catheter was removed, switched to hemodialysis, and discharged with a plan to continue IV Ancef with dialysis until 4/1.  It seems she had diarrhea during the stay and tested negative for C. difficile on 3/2.  In the ED, patient was afebrile, heart rate 90s, blood pressure 147/90, breathing on room air. Labs with WBC count 14.1, hemoglobin 8, potassium 3.2, albumin less than 1.5, lactic acid 1 CT abdomen/pelvis showed marked severity enterocolitis, also diffuse urinary bladder wall thickening which may represent sequela associated with cystitis.  Also findings consistent with chronic pancreatitis.   Admitted to [facility]  Nephrology was consulted.  Subjective: Patient was seen and examined this morning at dialysis. Young female.  Chronically sick looking. Continues to complain of diarrhea despite on Imodium every 6 hours Chart reviewed Remains afebrile, heart rate in the low 100s, blood pressure 130s, breathing on room air Most recent labs this morning with WBC count 11.9, hemoglobin 7.9 Blood glucose level seems to be running low in the last 24 hours, significantly low at 24 this morning.  Assessment and plan: Acute severe enterocolitis SIRS Young female with recent hospitalization and on long-term antibiotics course who presented  with worsening diarrhea and abdominal pain  CT abdomen showed severe enterocolitis  Last checked and tested negative for C. difficile on 3/2 during last hospitalization C. difficile and GI stool PCR tested and negative again 3/9. Does not meet sepsis criteria. She has no fever, mild leukocytosis, normal lactic acid. For diarrhea, patient was on Imodium PRN with WNL.  May benefit from GI evaluation. Recent Labs  Lab 07/13/23 0532 07/16/23 1801 07/17/23 0117 07/18/23 0535  WBC 17.7* 14.1*  --  11.9*  LATICACIDVEN  --   --  1.0  --    Recent MSSA bacteremia Admitted to ICU 2/24-07/13/23 for septic shock in setting of PD catheter associated peritonitis and MSSA bacteremia. Required levophed for one day. Transferred to [facility] on 2/27.  3/4 PD catheter removed by [facility].  TEE:  no endocarditis.  Recommended IV ancef with HD x 4 weeks (EOT 4/1) Pharm consult to continue ancef while inpatient   Orthostatic hypotension Chronic hypotension Mentioned dizziness due to diarrhea. Orthostatic vital signs positive. Continue midodrine   ESRD  After recent peritonitis, PD catheter was removed and switched to hemodialysis.   Nephrology consulted    Hypokalemia Potassium low likely due to diarrhea Manage level 1.9.  Check phosphorus level Recent Labs  Lab 07/12/23 0503 07/13/23 0532 07/16/23 1801 07/17/23 1617 07/18/23 0535  K 4.0 3.5 3.2*  --  3.6  MG  --   --   --  1.9  --   PHOS 3.1 2.3*  --   --   --    Uncontrolled type 1 diabetes mellitus Hypoglycemia A1C of 7.2 on 06/23/2023 In the last 24 hours, blood sugar level low on multiple occasions.  Currently on Lantus 15 units at bedtime. Received last dose of Lantus last night.  Hold Lantus today. For hypoglycemia, utilize D50.  Also release carb restriction from diet Recent Labs  Lab 07/18/23 0233 07/18/23 0612 07/18/23 0649 07/18/23 0755 07/18/23 1310  GLUCAP 168* 24* 129* 82 37*   Hyperlipidemia Continue crestor 10mg  daily     Chronic disease anemia Baseline 9-10, monitor. No active bleeding but hemoglobin low at 7.9 today.  Continue to monitor Recent Labs    07/20/22 1440 09/17/22 1559 07/10/23 0558 07/11/23 0443 07/13/23 0532 07/16/23 1801 07/18/23 0535  HGB  --    < > 9.5* 9.6* 9.2* 8.0* 7.9*  MCV  --    < > 86.5 86.4 87.2 90.1 90.7  VITAMINB12 >2000*  --   --   --   --   --   --   FOLATE 12.5  --   --   --   --   --   --    < > = values in this interval not displayed.   GERD Continue protonix BID     Mobility: Encourage ambulation  Goals of care   Code Status: Full Code     DVT prophylaxis:  heparin injection 5,000 Units Start: 07/17/23 1400 Place TED hose Start: 07/17/23 1047   Antimicrobials: IV Ancef Fluid: None Consultants: GI, nephrology Family Communication: None at bedside  Status: Inpatient Level of care:  Med-Surg   Patient is from: Home Needs to continue in-hospital care: Continues to diarrhea Anticipated d/c to: Pending clinical course    Diet:  Diet Order             Diet renal with fluid restriction Fluid consistency: Thin  Diet effective now                   Scheduled Meds:  (feeding supplement) PROSource Plus  30 mL Oral BID BM   calcitRIOL  1 mcg Oral Daily   Chlorhexidine Gluconate Cloth  6 each Topical Q0600   heparin  5,000 Units Subcutaneous Q8H   insulin aspart  0-6 Units Subcutaneous TID WC   midodrine  5 mg Oral TID WC   multivitamin  1 tablet Oral QHS   pantoprazole  40 mg Oral BID AC   rosuvastatin  10 mg Oral QHS   sucroferric oxyhydroxide  1,000 mg Oral TID WC    PRN meds: acetaminophen **OR** acetaminophen, albuterol, ondansetron **OR** ondansetron (ZOFRAN) IV, oxyCODONE   Infusions:    ceFAZolin (ANCEF) IV 1 g (07/17/23 1838)    Antimicrobials: Anti-infectives (From admission, onward)    Start     Dose/Rate Route Frequency Ordered Stop   07/17/23 1645  ceFAZolin (ANCEF) IVPB 1 g/50 mL premix        1 g 100 mL/hr over  30 Minutes Intravenous Every 24 hours 07/17/23 1556         Objective: Vitals:   07/18/23 1155 07/18/23 1207  BP: 118/79 122/81  Pulse: (!) 102 (!) 104  Resp: 14 (!) 23  Temp:    SpO2: 100% 100%    Intake/Output Summary (Last 24 hours) at 07/18/2023 1335 Last data filed at 07/18/2023 1207 Gross per 24 hour  Intake --  Output 2500 ml  Net -2500 ml   Filed Weights   07/16/23 1758 07/18/23 1207  Weight: 53.2 kg 54.2 kg   Weight change:  Body mass index is 18.17 kg/m.   Physical Exam: General exam: Young African-American female.  Chronically sick  looking Skin: No rashes, lesions or ulcers. HEENT: Atraumatic, normocephalic, no obvious bleeding Lungs: Clear to auscultation bilaterally,  CVS: S1, S2, no murmur,   GI/Abd: Soft, nontender, nondistended, bowel sound present,   CNS: Alert, awake, oriented x 3 Psychiatry: Mood appropriate,  Extremities: No pedal edema, no calf tenderness,   Data Review: I have personally reviewed the laboratory data and studies available.  F/u labs 45 Unresulted Labs (From admission, onward)     Start     Ordered   07/19/23 0500  Basic metabolic panel  Tomorrow morning,   R       Question:  Specimen collection method  Answer:  Lab=Lab collect   07/18/23 0906   07/19/23 0500  CBC with Differential/Platelet  Tomorrow morning,   R       Question:  Specimen collection method  Answer:  Lab=Lab collect   07/18/23 0906   07/19/23 0500  Magnesium  Tomorrow morning,   R       Question:  Specimen collection method  Answer:  Lab=Lab collect   07/18/23 0906   07/19/23 0500  Phosphorus  Tomorrow morning,   R       Question:  Specimen collection method  Answer:  Lab=Lab collect   07/18/23 0906   07/16/23 1801  Urinalysis, Routine w reflex microscopic -Urine, Clean Catch  Once,   URGENT       Question:  Specimen Source  Answer:  Urine, Clean Catch   07/16/23 1800   Pending  CBC  Once,   R       Question:  Specimen collection method  Answer:  Lab=Lab  collect   Pending            Admission date and time: 07/16/2023  5:39 PM   Total time spent in review of labs and imaging, patient evaluation, formulation of plan, documentation and communication with family: 55 minutes  Signed, Lorin Glass, MD [facility] 07/18/2023

## 2023-07-19 DIAGNOSIS — R5381 Other malaise: Secondary | ICD-10-CM | POA: Diagnosis not present

## 2023-07-19 DIAGNOSIS — E43 Unspecified severe protein-calorie malnutrition: Secondary | ICD-10-CM | POA: Insufficient documentation

## 2023-07-19 DIAGNOSIS — K529 Noninfective gastroenteritis and colitis, unspecified: Secondary | ICD-10-CM | POA: Diagnosis not present

## 2023-07-19 DIAGNOSIS — R933 Abnormal findings on diagnostic imaging of other parts of digestive tract: Secondary | ICD-10-CM | POA: Diagnosis not present

## 2023-07-19 LAB — CBC WITH DIFFERENTIAL/PLATELET
Abs Immature Granulocytes: 0.08 10*3/uL — ABNORMAL HIGH (ref 0.00–0.07)
Basophils Absolute: 0.2 10*3/uL — ABNORMAL HIGH (ref 0.0–0.1)
Basophils Relative: 2 %
Eosinophils Absolute: 0.4 10*3/uL (ref 0.0–0.5)
Eosinophils Relative: 3 %
HCT: 24.6 % — ABNORMAL LOW (ref 36.0–46.0)
Hemoglobin: 7.8 g/dL — ABNORMAL LOW (ref 12.0–15.0)
Immature Granulocytes: 1 %
Lymphocytes Relative: 26 %
Lymphs Abs: 3.1 10*3/uL (ref 0.7–4.0)
MCH: 29.1 pg (ref 26.0–34.0)
MCHC: 31.7 g/dL (ref 30.0–36.0)
MCV: 91.8 fL (ref 80.0–100.0)
Monocytes Absolute: 1.1 10*3/uL — ABNORMAL HIGH (ref 0.1–1.0)
Monocytes Relative: 9 %
Neutro Abs: 7.2 10*3/uL (ref 1.7–7.7)
Neutrophils Relative %: 59 %
Platelets: 237 10*3/uL (ref 150–400)
RBC: 2.68 MIL/uL — ABNORMAL LOW (ref 3.87–5.11)
RDW: 20.6 % — ABNORMAL HIGH (ref 11.5–15.5)
WBC: 11.9 10*3/uL — ABNORMAL HIGH (ref 4.0–10.5)
nRBC: 0.7 % — ABNORMAL HIGH (ref 0.0–0.2)

## 2023-07-19 LAB — BASIC METABOLIC PANEL
Anion gap: 6 (ref 5–15)
BUN: 10 mg/dL (ref 6–20)
CO2: 26 mmol/L (ref 22–32)
Calcium: 7.9 mg/dL — ABNORMAL LOW (ref 8.9–10.3)
Chloride: 104 mmol/L (ref 98–111)
Creatinine, Ser: 4.1 mg/dL — ABNORMAL HIGH (ref 0.44–1.00)
GFR, Estimated: 14 mL/min — ABNORMAL LOW (ref >60)
Glucose, Bld: 157 mg/dL — ABNORMAL HIGH (ref 70–99)
Potassium: 3.7 mmol/L (ref 3.5–5.1)
Sodium: 136 mmol/L (ref 135–145)

## 2023-07-19 LAB — SEDIMENTATION RATE: Sed Rate: 12 mm/h (ref 0–22)

## 2023-07-19 LAB — MAGNESIUM: Magnesium: 2 mg/dL (ref 1.7–2.4)

## 2023-07-19 LAB — GLUCOSE, CAPILLARY
Glucose-Capillary: 130 mg/dL — ABNORMAL HIGH (ref 70–99)
Glucose-Capillary: 155 mg/dL — ABNORMAL HIGH (ref 70–99)
Glucose-Capillary: 121 mg/dL — ABNORMAL HIGH (ref 70–99)
Glucose-Capillary: 90 mg/dL (ref 70–99)
Glucose-Capillary: 175 mg/dL — ABNORMAL HIGH (ref 70–99)

## 2023-07-19 LAB — PHOSPHORUS: Phosphorus: 3.1 mg/dL (ref 2.5–4.6)

## 2023-07-19 LAB — C-REACTIVE PROTEIN: CRP: 0.5 mg/dL (ref <1.0)

## 2023-07-19 MED ORDER — LIDOCAINE-PRILOCAINE 2.5-2.5 % EX CREA: 1.0000 | TOPICAL_CREAM | CUTANEOUS | Status: DC | PRN

## 2023-07-19 MED ORDER — ALTEPLASE 2 MG IJ SOLR: 2.0000 mg | Freq: Once | INTRAMUSCULAR | Status: DC | PRN

## 2023-07-19 MED ORDER — INSULIN GLARGINE 100 UNITS/ML SOLOSTAR PEN: 3.0000 [IU] | PEN_INJECTOR | Freq: Every day | SUBCUTANEOUS | Status: DC

## 2023-07-19 MED ORDER — LIDOCAINE HCL (PF) 1 % IJ SOLN: 5.0000 mL | INTRAMUSCULAR | Status: DC | PRN

## 2023-07-19 MED ORDER — LOPERAMIDE HCL 2 MG PO CAPS
2.0000 mg | ORAL_CAPSULE | Freq: Three times a day (TID) | ORAL | Status: DC
  Administered 2023-07-19 – 2023-07-20 (×3): 2 mg via ORAL
  Filled 2023-07-19 (×3): qty 1

## 2023-07-19 MED ORDER — ANTICOAGULANT SODIUM CITRATE 4% (200MG/5ML) IV SOLN: 5.0000 mL | Status: DC | PRN

## 2023-07-19 MED ORDER — PANCRELIPASE (LIP-PROT-AMYL) 12000-38000 UNITS PO CPEP
24000.0000 [IU] | ORAL_CAPSULE | Freq: Three times a day (TID) | ORAL | Status: DC
Start: 2023-07-19 — End: 2023-07-20
  Administered 2023-07-19 – 2023-07-20 (×4): 24000 [IU] via ORAL
  Filled 2023-07-19 (×5): qty 2

## 2023-07-19 MED ORDER — HEPARIN SODIUM (PORCINE) 1000 UNIT/ML DIALYSIS: 1000.0000 [IU] | INTRAMUSCULAR | Status: DC | PRN

## 2023-07-19 MED ORDER — PENTAFLUOROPROP-TETRAFLUOROETH EX AERO: 1.0000 | INHALATION_SPRAY | CUTANEOUS | Status: DC | PRN

## 2023-07-19 MED ORDER — INSULIN GLARGINE 100 UNIT/ML ~~LOC~~ SOLN
3.0000 [IU] | Freq: Every day | SUBCUTANEOUS | Status: DC
  Filled 2023-07-19 (×2): qty 0.03

## 2023-07-19 NOTE — Progress Notes (Signed)
 [facility] ROUNDING NOTE   Subjective: Patient feeling better today.  She reports only having 1 bowel movement overnight, and has not had any bowel movements today yet.  She is tolerating a diet.  Pain minimal.  She was started on Creon, and is also getting scheduled loperamide.  ESR, CRP normal, stool studies have not been collected.   Objective: Vital signs in last 24 hours: Temp:  [97.8 F (36.6 C)-98.5 F (36.9 C)] 98 F (36.7 C) (03/11 0729) Pulse Rate:  [101-111] 101 (03/11 0729) Resp:  [14-23] 18 (03/11 0729) BP: (91-122)/(59-82) 115/81 (03/11 0729) SpO2:  [99 %-100 %] 99 % (03/11 0729) Weight:  [54.2 kg] 54.2 kg (03/10 1207) Last BM Date : 07/18/23 General: NAD, pleasant African-American female, accompanied by mother at bedside Lungs:  CTA b/l, no w/r/r Heart:  RRR, no m/r/g Abdomen:  Soft, NT, ND, +BS Ext:  No c/c/e    Intake/Output from previous day: 03/10 0701 - 03/11 0700 In: 110.3 [IV Piggyback:110.3] Out: 2500  Intake/Output this shift: No intake/output data recorded.   Lab Results: Recent Labs    07/16/23 1801 07/18/23 0535 07/19/23 0516  WBC 14.1* 11.9* 11.9*  HGB 8.0* 7.9* 7.8*  PLT 172 214 237  MCV 90.1 90.7 91.8   BMET Recent Labs    07/16/23 1801 07/18/23 0535 07/19/23 0516  NA 136 139 136  K 3.2* 3.6 3.7  CL 102 109 104  CO2 25 23 26   GLUCOSE 101* 52* 157*  BUN 14 19 10   CREATININE 4.32* 6.07* 4.10*  CALCIUM 7.7* 7.8* 7.9*   LFT Recent Labs    07/16/23 1801 07/18/23 0535  PROT 4.8* 4.8*  ALBUMIN <1.5* <1.5*  AST 20 19  ALT <5 <5  ALKPHOS 96 89  BILITOT 0.5 0.4   PT/INR No results for input(s): "INR" in the last 72 hours.    Imaging/Other results: No results found.    Assessment and Plan:  28 year old female with type 1 diabetes, end-stage renal disease on dialysis, chronic pancreatitis, and sent diagnosis of infected peritoneal dialysis catheter, admitted with ongoing chronic diarrhea and  malaise. CT scan showing suspected diffuse bowel wall thickening suggestive of enterocolitis, however patient's symptoms not consistent with an acute infection.  Infectious GI PCR panel negative.  I suspect that the bowel wall thickening is likely related to her anasarca, and not an infection or inflammatory diarrhea. Her diarrhea has improved with reinitiation of pancreatic enzyme replacement therapy and loperamide.    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

## 2023-07-19 NOTE — Progress Notes (Signed)
 Received patient in bed to unit.  Alert and oriented.  Informed consent signed and in chart.   TX duration: 3 hours  Patient tolerated well.  Transported back to the room  Alert, without acute distress.  Hand-off given to patient's nurse.   Access used: R AV Fistula Upper Arm Access issues: none  Total UF removed: 1.4L Medication(s) given: Midodrine   07/19/23 1717  Vitals  Temp 98.1 F (36.7 C)  Temp Source Oral  BP 101/64  MAP (mmHg) 74  Pulse Rate (!) 104  ECG Heart Rate (!) 104  Resp 18  Oxygen Therapy  SpO2 100 %  O2 Device Room Air  During Treatment Monitoring  Duration of HD Treatment -hour(s) 3 hour(s)  HD Safety Checks Performed Yes  Intra-Hemodialysis Comments Tx completed  Dialysis Fluid Bolus Normal Saline  Bolus Amount (mL) 300 mL  Post Treatment  Dialyzer Clearance Clear  Liters Processed 72  Fluid Removed (mL) 1400 mL  Tolerated HD Treatment No (Comment)  Post-Hemodialysis Comments Patient BP low and UF off for the last hour.  Fistula / Graft Right Upper arm Arteriovenous fistula  Placement Date/Time: 12/08/21 1401   Placed prior to admission: No  Orientation: Right  Access Location: Upper arm  Access Type: (c) Arteriovenous fistula  Status Deaccessed    Stacie Glaze LPN [facility]

## 2023-07-19 NOTE — Plan of Care (Signed)

## 2023-07-19 NOTE — Progress Notes (Signed)
 [facility] Progress Note   Subjective:   Completed dialysis yesterday net UF 2.5L  Eating breakfast. Feels a little better. Abd pain/diarrhea improving   Objective Vitals:   07/18/23 2121 07/19/23 0051 07/19/23 0424 07/19/23 0729  BP: (!) 91/59 97/61 117/79 115/81  Pulse: (!) 107 (!) 111 (!) 108 (!) 101  Resp: 19  18 18   Temp: 97.8 F (36.6 C)  98 F (36.7 C) 98 F (36.7 C)  TempSrc: Oral  Oral Oral  SpO2: 100%  100% 99%  Weight:      Height:       Additional Objective Labs: Basic Metabolic Panel: Recent Labs  Lab 07/13/23 0532 07/16/23 1801 07/18/23 0535 07/19/23 0516  NA 133* 136 139 136  K 3.5 3.2* 3.6 3.7  CL 105 102 109 104  CO2 25 25 23 26   GLUCOSE 102* 101* 52* 157*  BUN 13 14 19 10   CREATININE 5.03* 4.32* 6.07* 4.10*  CALCIUM 7.3* 7.7* 7.8* 7.9*  PHOS 2.3*  --   --  3.1   CBC: Recent Labs  Lab 07/13/23 0532 07/16/23 1801 07/18/23 0535 07/19/23 0516  WBC 17.7* 14.1* 11.9* 11.9*  NEUTROABS  --   --   --  7.2  HGB 9.2* 8.0* 7.9* 7.8*  HCT 27.3* 24.5* 24.5* 24.6*  MCV 87.2 90.1 90.7 91.8  PLT 138* 172 214 237   Blood Culture    Component Value Date/Time   SDES PERITONEAL DIALYSATE FLUID 07/11/2023 0125   SPECREQUEST NONE 07/11/2023 0125   CULT  07/11/2023 0125    NO GROWTH 3 DAYS Performed at [facility], [address], [place], [place] [address]    REPTSTATUS 07/14/2023 FINAL 07/11/2023 0125     Physical Exam General: Alert, nad  Heart: RRR Lungs: Clear, normal wob Abdomen: soft non-tender  Extremities: 1+ LE edema  Dialysis Access: R AVF +bruit   Medications:   ceFAZolin (ANCEF) IV 1 g (07/18/23 1826)    (feeding supplement) PROSource Plus  30 mL Oral BID BM   calcitRIOL  1 mcg Oral Daily   Chlorhexidine Gluconate Cloth  6 each Topical Q0600   feeding supplement  1 Container Oral BID BM   heparin  5,000 Units Subcutaneous Q8H   insulin aspart  0-6 Units Subcutaneous TID WC   lipase/protease/amylase   24,000 Units Oral TID WC   loperamide  2 mg Oral Q8H   midodrine  5 mg Oral TID WC   multivitamin  1 tablet Oral QHS   pantoprazole  40 mg Oral BID AC   rosuvastatin  10 mg Oral QHS    Dialysis Orders:  TTS [facility] 4h  B400   51kg  3K bath  AVF   Heparin none - last OP HD 3/07, post wt 55.6kg (4.6kg over) - rocaltrol 2.25 mcg po three times per week - mircera 100 mcg q 2 wks, last 3/07, due 3/21  Assessment/Plan: 1. Chronic diarrhea/ Enterocolitis on CT -  GI panel neg. Management per primary/GI  2. H/o MSSA peritonitis/bacteremia - PD cathter removed during prior admit. Continue Ancef 2 g q HD until 08/10/23.  3. ESRD - Usual HD TTS.HD off schedule Monday. Back on schedule for HD today  4..Chronic hypotension - on midodrine 5 mg TID. BP ok  5. Volume - UF 2-2.5L with HD.  6.  Anemia-  Hgb 7.9. Just received ESA as OP. No IV Fe with infection. Follow trends. Transfuse prn.  7. MBD of CKD -  Corr Ca ok. Phos lowish. Holding Velphoro binder. 8. Nutrition - Add prot supp for low albumin  9. Type 1 DM - insulin per primary   Lindsey Blase PA-C  [facility] 07/19/2023,9:46 AM

## 2023-07-19 NOTE — Progress Notes (Signed)
 PROGRESS NOTE  Lindsey Murillo  DOB: 1995-05-27  PCP: [name], DO ION:[id number]  DOA: 07/16/2023  LOS: 1 day  Hospital Day: 4  Brief narrative: Lindsey Murillo is a 28 y.o. female with PMH significant for ESRD on HD, DM 1, HTN, chronic anemia, diabetic neuropathy, autoimmune thyroiditis, goiter, acanthosis nigricans. 3/8, patient presented to the ED with complaint of diarrhea, low blood pressure, dizziness for 2 days.  She reports diarrhea started she was discharged from the hospital on March 5. Recently hospitalized 2/24 to 3/5 for septic shock secondary to PD catheter associated peritonitis and MSSA bacteremia, TEE negative for endocarditis.  PD catheter was removed, switched to hemodialysis, and discharged with a plan to continue IV Ancef with dialysis until 4/1.  It seems she had diarrhea during the stay and tested negative for C. difficile on 3/2.  In the ED, patient was afebrile, heart rate 90s, blood pressure 147/90, breathing on room air. Labs with WBC count 14.1, hemoglobin 8, potassium 3.2, albumin less than 1.5, lactic acid 1 CT abdomen/pelvis showed marked severity enterocolitis, also diffuse urinary bladder wall thickening which may represent sequela associated with cystitis.  Also findings consistent with chronic pancreatitis.   Admitted to [facility]  Nephrology was consulted.  Subjective: Patient was seen and examined this morning. Propped up in bed.  Not in distress.  Mother at bedside. Reports diarrhea is chronic.  Had 5 episodes this morning.  She was off and on trying Imodium at home.  Assessment and plan: Acute severe enterocolitis SIRS Young female with recent hospitalization and on long-term antibiotics course who presented with worsening diarrhea and abdominal pain  Last checked and tested negative for C. difficile on 3/2 during last hospitalization C. difficile and GI stool PCR tested and negative again 3/9. On admission, did not meet sepsis criteria.  She has no fever, mild leukocytosis, normal lactic acid. CT abdomen showed diffuse bowel wall thickening suggestive of enterocolitis.  GI was consulted.  Per GI note, patient's symptoms not consistent with an acute infection.  Bowel thickening is likely related to anasarca. Per recommendation, reinitiated on Creon.  Also started on Imodium.  I have scheduled it for next 2 days.  Can change to as needed after that. Continue to monitor diarrhea frequency and severity.. Recent Labs  Lab 07/13/23 0532 07/16/23 1801 07/17/23 0117 07/18/23 0535 07/19/23 0516  WBC 17.7* 14.1*  --  11.9* 11.9*  LATICACIDVEN  --   --  1.0  --   --    Recent MSSA bacteremia Admitted to ICU 2/24-07/13/23 for septic shock in setting of PD catheter associated peritonitis and MSSA bacteremia. Required levophed for one day. Transferred to [facility] on 2/27.  3/4 PD catheter removed by [facility].  TEE:  no endocarditis.  Recommended IV ancef with HD x 4 weeks (EOT 4/1) Pharm consult to continue ancef while inpatient   Orthostatic hypotension Chronic hypotension Mentioned dizziness due to diarrhea. Orthostatic vital signs was positive in the ED. Continue midodrine Encourage ambulation.   ESRD  After recent peritonitis, PD catheter was removed and switched to hemodialysis.   Nephrology consulted    Hypokalemia Potassium level was low likely due to diarrhea Magnesium and phosphorus level normal. Recent Labs  Lab 07/13/23 0532 07/16/23 1801 07/17/23 1617 07/18/23 0535 07/19/23 0516  K 3.5 3.2*  --  3.6 3.7  MG  --   --  1.9  --  2.0  PHOS 2.3*  --   --   --  3.1  Uncontrolled type 1 diabetes mellitus Hypoglycemia A1C of 7.2 on 06/23/2023 Had hypoglycemia episodes in the first 24 hours.   Home med list showed 15 units Lantus nightly but patient states she does not need more than 5 units. Currently on SSI with Accu-Cheks only.  Blood sugar level gradually improving. Resume Lantus at 3 units from tonight. Recent Labs   Lab 07/18/23 1701 07/18/23 2124 07/19/23 0413 07/19/23 0729 07/19/23 1151  GLUCAP 95 197* 130* 155* 121*   Hyperlipidemia Continue crestor 10mg  daily    Chronic disease anemia Baseline 9-10, monitor. No active bleeding but hemoglobin low at 7.8 today.  Continue to monitor Recent Labs    07/20/22 1440 09/17/22 1559 07/11/23 0443 07/13/23 0532 07/16/23 1801 07/18/23 0535 07/19/23 0516  HGB  --    < > 9.6* 9.2* 8.0* 7.9* 7.8*  MCV  --    < > 86.4 87.2 90.1 90.7 91.8  VITAMINB12 >2000*  --   --   --   --   --   --   FOLATE 12.5  --   --   --   --   --   --    < > = values in this interval not displayed.   GERD Continue protonix BID     Mobility: Encourage ambulation  Goals of care   Code Status: Full Code     DVT prophylaxis:  heparin injection 5,000 Units Start: 07/17/23 1400 Place TED hose Start: 07/17/23 1047   Antimicrobials: IV Ancef Fluid: None Consultants: GI, nephrology Family Communication: None at bedside  Status: Inpatient Level of care:  Med-Surg   Patient is from: Home Needs to continue in-hospital care: Continues to have diarrhea Anticipated d/c to: Pending clinical course.     Diet:  Diet Order             Diet regular Room service appropriate? Yes; Fluid consistency: Thin; Fluid restriction: 1200 mL Fluid  Diet effective now                   Scheduled Meds:  (feeding supplement) PROSource Plus  30 mL Oral BID BM   calcitRIOL  1 mcg Oral Daily   Chlorhexidine Gluconate Cloth  6 each Topical Q0600   feeding supplement  1 Container Oral BID BM   heparin  5,000 Units Subcutaneous Q8H   insulin aspart  0-6 Units Subcutaneous TID WC   insulin glargine  3 Units Subcutaneous Q2200   lipase/protease/amylase  24,000 Units Oral TID WC   loperamide  2 mg Oral Q8H   midodrine  5 mg Oral TID WC   multivitamin  1 tablet Oral QHS   pantoprazole  40 mg Oral BID AC   rosuvastatin  10 mg Oral QHS    PRN meds: acetaminophen **OR**  acetaminophen, albuterol, alteplase, anticoagulant sodium citrate, heparin, lidocaine (PF), lidocaine-prilocaine, ondansetron **OR** ondansetron (ZOFRAN) IV, oxyCODONE, pentafluoroprop-tetrafluoroeth   Infusions:   anticoagulant sodium citrate      ceFAZolin (ANCEF) IV 1 g (07/18/23 1826)    Antimicrobials: Anti-infectives (From admission, onward)    Start     Dose/Rate Route Frequency Ordered Stop   07/17/23 1645  ceFAZolin (ANCEF) IVPB 1 g/50 mL premix        1 g 100 mL/hr over 30 Minutes Intravenous Every 24 hours 07/17/23 1556         Objective: Vitals:   07/19/23 0424 07/19/23 0729  BP: 117/79 115/81  Pulse: (!) 108 (!) 101  Resp: 18 18  Temp: 98 F (36.7 C) 98 F (36.7 C)  SpO2: 100% 99%    Intake/Output Summary (Last 24 hours) at 07/19/2023 1339 Last data filed at 07/19/2023 0600 Gross per 24 hour  Intake 110.28 ml  Output --  Net 110.28 ml   Filed Weights   07/16/23 1758 07/18/23 1207  Weight: 53.2 kg 54.2 kg   Weight change:  Body mass index is 18.17 kg/m.   Physical Exam: General exam: Young African-American female.  Chronically sick looking Skin: No rashes, lesions or ulcers. HEENT: Atraumatic, normocephalic, no obvious bleeding Lungs: Clear to auscultation bilaterally,  CVS: S1, S2, no murmur,   GI/Abd: Soft, nontender, nondistended, bowel sound present,   CNS: Alert, awake, oriented x 3 Psychiatry: Mood appropriate. Extremities: No pedal edema, no calf tenderness,   Data Review: I have personally reviewed the laboratory data and studies available.  F/u labs 45 Unresulted Labs (From admission, onward)     Start     Ordered   07/19/23 0800  Calprotectin, Fecal  Once,   R        07/18/23 1613   07/19/23 0800  Pancreatic elastase, fecal  Once,   R        07/18/23 1613   07/16/23 1801  Urinalysis, Routine w reflex microscopic -Urine, Clean Catch  Once,   URGENT       Question:  Specimen Source  Answer:  Urine, Clean Catch   07/16/23 1800    Pending  CBC  Once,   R       Question:  Specimen collection method  Answer:  Lab=Lab collect   Pending            Admission date and time: 07/16/2023  5:39 PM   Total time spent in review of labs and imaging, patient evaluation, formulation of plan, documentation and communication with family: 45 minutes  Signed, Lorin Glass, MD [facility] 07/19/2023

## 2023-07-19 NOTE — Progress Notes (Signed)
 Pt receives out-pt HD at [facility] on TTS 12:00 chair time. Will assist as needed.   Olivia Canter Renal Navigator [phone]

## 2023-07-20 ENCOUNTER — Encounter (HOSPITAL_COMMUNITY): Payer: Self-pay | Admitting: Hematology

## 2023-07-20 ENCOUNTER — Other Ambulatory Visit (HOSPITAL_COMMUNITY): Payer: Self-pay

## 2023-07-20 DIAGNOSIS — R5381 Other malaise: Secondary | ICD-10-CM | POA: Diagnosis not present

## 2023-07-20 DIAGNOSIS — K529 Noninfective gastroenteritis and colitis, unspecified: Secondary | ICD-10-CM | POA: Diagnosis not present

## 2023-07-20 DIAGNOSIS — R933 Abnormal findings on diagnostic imaging of other parts of digestive tract: Secondary | ICD-10-CM | POA: Diagnosis not present

## 2023-07-20 LAB — GLUCOSE, CAPILLARY
Glucose-Capillary: 89 mg/dL (ref 70–99)
Glucose-Capillary: 77 mg/dL (ref 70–99)
Glucose-Capillary: 88 mg/dL (ref 70–99)

## 2023-07-20 MED ORDER — PROSOURCE PLUS PO LIQD
30.0000 mL | Freq: Two times a day (BID) | ORAL | 0 refills | Status: AC
  Filled 2023-07-20: qty 1800, 30d supply, fill #0

## 2023-07-20 MED ORDER — TRESIBA FLEXTOUCH 100 UNIT/ML ~~LOC~~ SOPN
3.0000 [IU] | PEN_INJECTOR | Freq: Every day | SUBCUTANEOUS | 3 refills | Status: DC
  Filled 2023-07-20: qty 15, 280d supply, fill #0

## 2023-07-20 MED ORDER — PANCRELIPASE (LIP-PROT-AMYL) 12000-38000 UNITS PO CPEP
24000.0000 [IU] | ORAL_CAPSULE | Freq: Three times a day (TID) | ORAL | 0 refills | Status: AC
  Filled 2023-07-20: qty 100, 17d supply, fill #0

## 2023-07-20 MED ORDER — LOPERAMIDE HCL 2 MG PO CAPS
2.0000 mg | ORAL_CAPSULE | ORAL | 0 refills | Status: AC | PRN
  Filled 2023-07-20: qty 30, 30d supply, fill #0

## 2023-07-20 NOTE — Progress Notes (Signed)
 [facility] ROUNDING NOTE   Subjective: Patient states she had 3 or 4 bowel movements yesterday.  She did not wake up to have a bowel movement last night.  Minimal abdominal discomfort.  Tolerating regular diet.  Stool sample has not been collected for the ordered stool studies.   Objective: Vital signs in last 24 hours: Temp:  [97.8 F (36.6 C)-98.3 F (36.8 C)] 98 F (36.7 C) (03/12 0740) Pulse Rate:  [98-112] 98 (03/12 0740) Resp:  [12-22] 18 (03/12 0316) BP: (76-136)/(51-95) 105/69 (03/12 0740) SpO2:  [98 %-100 %] 100 % (03/12 0740) Weight:  [52.8 kg] 52.8 kg (03/11 1723) Last BM Date : 07/18/23 General: NAD, pleasant African-American female, sleeping, easily arousable Abdomen:  Soft, NT, ND, +BS    Intake/Output from previous day: 03/11 0701 - 03/12 0700 In: -  Out: 1400  Intake/Output this shift: No intake/output data recorded.   Lab Results: Recent Labs    07/18/23 0535 07/19/23 0516  WBC 11.9* 11.9*  HGB 7.9* 7.8*  PLT 214 237  MCV 90.7 91.8   BMET Recent Labs    07/18/23 0535 07/19/23 0516  NA 139 136  K 3.6 3.7  CL 109 104  CO2 23 26  GLUCOSE 52* 157*  BUN 19 10  CREATININE 6.07* 4.10*  CALCIUM 7.8* 7.9*   LFT Recent Labs    07/18/23 0535  PROT 4.8*  ALBUMIN <1.5*  AST 19  ALT <5  ALKPHOS 89  BILITOT 0.4   PT/INR No results for input(s): "INR" in the last 72 hours.    Imaging/Other results: No results found.    Assessment and Plan:  28 year old female with type 1 diabetes, end-stage renal disease on dialysis, chronic pancreatitis, and sent diagnosis of infected peritoneal dialysis catheter, admitted with ongoing chronic diarrhea and malaise. CT scan showing suspected diffuse bowel wall thickening suggestive of enterocolitis, however patient's symptoms not consistent with an acute infection.  Infectious GI PCR panel negative.  I suspect that the bowel wall thickening is likely related to her anasarca, and not an  infection or inflammatory diarrhea. Her diarrhea has improved with reinitiation of pancreatic enzyme replacement therapy and loperamide.     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

## 2023-07-20 NOTE — Discharge Summary (Signed)
 Physician Discharge Summary   Patient: Lindsey Murillo MRN: [id number] DOB: May 15, 1995  Admit date:     07/16/2023  Discharge date: 07/20/23  Discharge Physician: Jonah Blue   PCP: [name], DO   Recommendations at discharge:   Resume dialysis tomorrow as scheduled Continue Cefazolin (Ancef) at dialysis through 4/1 Take Creon as prescribed Add Imodium as needed for diarrhea Decrease Tresiba to 3 units every night for now (this may need to be increased based on glucose control) Follow up with GI, call for an appointment Follow up with Dr. Adriana Simas in 1-2 weeks  Discharge Diagnoses: Principal Problem:   Colitis Active Problems:   MSSA bacteremia   Hypokalemia   ESRD (end stage renal disease) ([name])   Dizziness   Uncontrolled type 1 diabetes mellitus with hyperglycemia ([name])   Hyperlipidemia   GERD (gastroesophageal reflux disease)   Anemia in ESRD (end-stage renal disease) ([name])   Pressure injury of skin   Protein-calorie malnutrition, severe    Hospital Course: 28yo with h/o T1DM, HTN, autoimmune thyroiditis, HLD, and ESRD on HD who presented with diarrhea and abdominal pain on 3/8.  She was previously admitted from 2/24-3/5 for septic shock due to PD catheter-associated peritonitis and MSSA bacteremia.  She is continuing treatment with Cefazolin for 4 weeks, through 4/1.  This time, she was found to have enterocolitis on imaging.  Her abdominal pain has improved and she is tolerating a regular diet.  Diarrhea has improved with addition of Creon and loperamide and she is stable for dc to home.  Assessment and Plan:  Acute enterocolitis Recent hospitalization for sepsis, on long-term antibiotics, presented with worsening diarrhea and abdominal pain  C. difficile and GI stool PCR negative Sepsis ruled out CT abdomen showed diffuse bowel wall thickening suggestive of enterocolitis GI was consulted - patient's symptoms not consistent with an acute infection and bowel  thickening is likely related to anasarca Per recommendation, reinitiated on Creon and started on Imodium with improvement in symptoms   Recent MSSA bacteremia Admitted to ICU 2/24-07/13/23 for septic shock in setting of PD catheter associated peritonitis and MSSA bacteremia 3/4 PD catheter removed by [facility] TEE:  no endocarditis Recommended IV ancef with HD x 4 weeks (EOT 4/1)   Orthostatic hypotension Chronic hypotension Dizziness due to orthostatic hypotension in the setting of diarrhea Continue midodrine   ESRD  After recent peritonitis, PD catheter was removed and switched to hemodialysis Nephrology consulted  Patient will need to return to home HD tomorrow (3/13)   Uncontrolled type 1 diabetes mellitus Recent A1C 7.2, reasonable control Had hypoglycemia episodes in the first 24 hours, resolved   Home med list showed 15 units Lantus nightly but patient states she does not need more than 5 units and currently on 3 units at bedtime with reasonable control   Hyperlipidemia Continue crestor 10mg  daily    Chronic disease anemia Baseline 9-10 Hgb 7.8 on last check No active bleeding    GERD Continue protonix BID    Malnutrition Nutrition Problem: Severe Malnutrition Etiology: acute illness Signs/Symptoms: moderate fat depletion, moderate muscle depletion Interventions: Boost Breeze, Liberalize Diet, MVI           Consultants: GI Nephrology Nutrition   Procedures: HD 3/10   Antibiotics: Cefazolin 3/9-4/1    Pain control - [place] Controlled Substance Reporting System database was reviewed. and patient was instructed, not to drive, operate heavy machinery, perform activities at heights, swimming or participation in water activities or provide baby-sitting services while  on Pain, Sleep and Anxiety Medications; until their outpatient Physician has advised to do so again. Also recommended to not to take more than prescribed Pain, Sleep and Anxiety Medications.     Disposition: Home Diet recommendation:  Renal diet DISCHARGE MEDICATION: Allergies as of 07/20/2023   No Known Allergies      Medication List     STOP taking these medications    cetirizine HCl 5 MG/5ML Soln Commonly known as: Zyrtec   gentamicin cream 0.1 % Commonly known as: GARAMYCIN       TAKE these medications    (feeding supplement) PROSource Plus liquid Take 30 mLs by mouth 2 (two) times daily between meals.   acetaminophen 325 MG tablet Commonly known as: TYLENOL Take 325 mg by mouth every 6 (six) hours as needed for mild pain (pain score 1-3) or moderate pain (pain score 4-6).   calcitRIOL 0.5 MCG capsule Commonly known as: ROCALTROL Take 1 mcg by mouth daily.   ceFAZolin 1-4 GM/50ML-% Soln Commonly known as: ANCEF Inject 100 mLs (2 g total) into the vein every dialysis for up to 27 days.   Dexcom G7 Receiver Devi by Does not apply route.   Dexcom G7 Sensor Misc by Does not apply route.   Dialyvite 800 0.8 MG Tabs Take 1 tablet by mouth daily.   Fiasp 100 UNIT/ML Soln Generic drug: Insulin Aspart (w/Niacinamide) Inject 5-8 Units as directed with breakfast, with lunch, and with evening meal. Sliding Scale   loperamide 2 MG capsule Commonly known as: IMODIUM Take 1 capsule (2 mg total) by mouth as needed for diarrhea or loose stools.   medroxyPROGESTERone 10 MG tablet Commonly known as: PROVERA Take provera 10 mg for 10 days every 3 months   midodrine 5 MG tablet Commonly known as: PROAMATINE Take 1 tablet (5 mg total) by mouth 3 (three) times daily with meals.   Pancrelipase (Lip-Prot-Amyl) 24000-76000 units Cpep Take 1 capsule (24,000 Units total) by mouth 3 (three) times daily with meals.   pantoprazole 40 MG tablet Commonly known as: Protonix Take 1 tablet (40 mg total) by mouth 2 (two) times daily before a meal.   rosuvastatin 10 MG tablet Commonly known as: CRESTOR Take 1 tablet (10 mg total) by mouth daily.   Evaristo Bury  FlexTouch 100 UNIT/ML FlexTouch Pen Generic drug: insulin degludec Inject 3 Units into the skin at bedtime. What changed: how much to take   Velphoro 500 MG chewable tablet Generic drug: sucroferric oxyhydroxide Chew 1,000 mg by mouth 3 (three) times daily with meals.   Ventolin HFA 108 (90 Base) MCG/ACT inhaler Generic drug: albuterol Inhale 2 puffs into the lungs every 4 (four) hours as needed for wheezing or shortness of breath. 2 puffs once every 4 hours as needed for cough, wheeze, shortness of breath and chest tightness. May use 2 puffs 5 to 15 minutes before activity to decrease cough or wheeze.        Discharge Exam:   Subjective: Feeling ok, no specific complaints.  Tolerating regular diet.   Objective: Vitals:   07/20/23 0316 07/20/23 0740  BP: 101/67 105/69  Pulse: (!) 104 98  Resp: 18   Temp: 98.1 F (36.7 C) 98 F (36.7 C)  SpO2: 98% 100%    Intake/Output Summary (Last 24 hours) at 07/20/2023 1312 Last data filed at 07/19/2023 1717 Gross per 24 hour  Intake --  Output 1400 ml  Net -1400 ml   Filed Weights   07/16/23 1758 07/18/23 1207 07/19/23 1723  Weight: 53.2 kg 54.2 kg 52.8 kg    Exam:  General:  Appears calm and comfortable and is in NAD Eyes:   EOMI, normal lids, iris ENT:  grossly normal hearing, lips & tongue, mmm Neck:  no LAD, masses or thyromegaly Cardiovascular:  RRR, no m/r/g. No LE edema.  Respiratory:   CTA bilaterally with no wheezes/rales/rhonchi.  Normal respiratory effort. Abdomen:  soft, NT, ND Skin:  no rash or induration seen on limited exam Musculoskeletal:  grossly normal tone BUE/BLE, good ROM, no bony abnormality Psychiatric:  grossly normal mood and affect, speech fluent and appropriate, AOx3 Neurologic:  CN 2-12 grossly intact, moves all extremities in coordinated fashion  Data Reviewed: I have reviewed the patient's lab results since admission.  Pertinent labs for today include:   None today    Condition at  discharge: stable  The results of significant diagnostics from this hospitalization (including imaging, microbiology, ancillary and laboratory) are listed below for reference.   Imaging Studies: CT ABDOMEN PELVIS WO CONTRAST Result Date: 07/17/2023 CLINICAL DATA:  Dizziness and diarrhea. EXAM: CT ABDOMEN AND PELVIS WITHOUT CONTRAST TECHNIQUE: Multidetector CT imaging of the abdomen and pelvis was performed following the standard protocol without IV contrast. RADIATION DOSE REDUCTION: This exam was performed according to the departmental dose-optimization program which includes automated exposure control, adjustment of the mA and/or kV according to patient size and/or use of iterative reconstruction technique. COMPARISON:  July 04, 2023 FINDINGS: Lower chest: No acute abnormality. Hepatobiliary: No focal liver abnormality is seen. The gallbladder appears to be contracted without evidence of gallstones, gallbladder wall thickening, or biliary dilatation. Pancreas: There is extensive pancreatic atrophy with numerous punctate calcifications seen scattered throughout the pancreas. Spleen: Normal in size without focal abnormality. Adrenals/Urinary Tract: Adrenal glands are unremarkable. Kidneys are normal, without obstructing renal calculi, focal lesion, or hydronephrosis. A 2 mm nonobstructing renal calculus is seen within the lower pole of the right kidney. There is mild diffuse urinary bladder wall thickening. Stomach/Bowel: Stomach is within normal limits. Appendix appears normal. No evidence of bowel dilatation. Numerous markedly thickened and inflamed loops of small bowel are seen throughout the abdomen and pelvis. The majority of the large bowel is poorly distended, however moderate to markedly thickened and inflamed mid and distal ascending colon is suspected. Vascular/Lymphatic: Aortic atherosclerosis. Subcentimeter para-aortic lymph nodes are seen. Reproductive: The uterus is not clearly visualized. The  bilateral adnexa are unremarkable. Other: Mild to moderate severity anasarca is seen throughout the abdominal and pelvic walls. A small amount of pelvic free fluid is noted. Musculoskeletal: No acute or significant osseous findings. IMPRESSION: 1. Marked severity enterocolitis. 2. Mild diffuse urinary bladder wall thickening which may represent sequelae associated with cystitis. Correlation with urinalysis is recommended. 3. Small amount of pelvic free fluid. 4. Mild to moderate severity anasarca. 5. Findings consistent with chronic pancreatitis. 6. 2 mm nonobstructing right renal calculus. 7. Aortic atherosclerosis. Aortic Atherosclerosis (ICD10-I70.0). Electronically Signed   By: Aram Candela M.D.   On: 07/17/2023 03:46   ECHO TEE Result Date: 07/09/2023    TRANSESOPHOGEAL ECHO REPORT   Patient Name:   Lindsey Murillo Date of Exam: 07/08/2023 Medical Rec #:  [id number]           Height:       68.0 in Accession #:    [id number]          Weight:       114.0 lb Date of Birth:  [birth year]  BSA:          1.609 m Patient Age:    27 years            BP:           112/72 mmHg Patient Gender: F                   HR:           79 bpm. Exam Location:  Inpatient Procedure: 2D Echo, Cardiac Doppler, Color Doppler and Saline Contrast Bubble            Study (Both Spectral and Color Flow Doppler were utilized during            procedure). Indications:     Septic Shock  History:         Patient has prior history of Echocardiogram examinations, most                  recent 07/06/2023. ESRD; Risk Factors:Hypertension, Diabetes and                  Dyslipidemia.  Sonographer:     Lucendia Herrlich RCS Referring Phys:  [id number] Ladona Ridgel A PARCELLS Diagnosing Phys: Weston Brass MD PROCEDURE: After discussion of the risks and benefits of a TEE, an informed consent was obtained from the patient. TEE procedure time was 22 minutes. The transesophogeal probe was passed without difficulty through the esophogus of the  patient. Imaged were obtained with the patient in a left lateral decubitus position. Local oropharyngeal anesthetic was provided with Cetacaine. Sedation performed by different physician. The patient was monitored while under deep sedation. Anesthestetic sedation was provided intravenously by Anesthesiology: 261.56mg  of Propofol, 100mg  of Lidocaine. Image quality was adequate. The patient's vital signs; including heart rate, blood pressure, and oxygen saturation; remained stable throughout the procedure. The patient developed no complications during the procedure. Challenging mid esophageal views, patient repositioned with minimal improvement in LV imaging.  IMPRESSIONS  1. Prominent lateral and apical LV trabeculations. Left ventricular ejection fraction, by estimation, is 55 to 60%. The left ventricle has normal function.  2. Right ventricular systolic function is normal. The right ventricular size is normal.  3. No left atrial/left atrial appendage thrombus was detected. The LAA emptying velocity was 60 cm/s.  4. A small pericardial effusion is present.  5. The mitral valve is grossly normal. Trivial mitral valve regurgitation.  6. The aortic valve is tricuspid. Aortic valve regurgitation is not visualized. No aortic stenosis is present.  7. 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 Agitated saline contrast bubble study was positive with shunting observed within 3-6 cardiac cycles suggestive of interatrial shunt. Conclusion(s)/Recommendation(s): No evidence of vegetation/infective endocarditis on this transesophageael echocardiogram. FINDINGS  Left Ventricle: Prominent lateral and apical LV trabeculations. Left ventricular ejection fraction, by estimation, is 55 to 60%. The left ventricle has normal function. The left ventricular internal cavity size was normal in size. Right Ventricle: The right ventricular size is normal. No increase in right ventricular wall thickness. Right ventricular systolic function is normal.  Left Atrium: Left atrial size was normal in size. No left atrial/left atrial appendage thrombus was detected. The LAA emptying velocity was 60 cm/s. Right Atrium: Right atrial size was normal in size. Pericardium: A small pericardial effusion is present. Mitral Valve: The mitral valve is grossly normal. Trivial mitral valve regurgitation. There is no evidence of mitral valve vegetation. Tricuspid Valve: The tricuspid valve is normal in structure. Tricuspid  valve regurgitation is trivial. There is no evidence of tricuspid valve vegetation. Aortic Valve: The aortic valve is tricuspid. Aortic valve regurgitation is not visualized. No aortic stenosis is present. There is no evidence of aortic valve vegetation. Pulmonic Valve: The pulmonic valve was normal in structure. Pulmonic valve regurgitation is trivial. There is no evidence of pulmonic valve vegetation. Aorta: The aortic root and ascending aorta are structurally normal, with no evidence of dilitation. 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 Agitated saline contrast was given intravenously to evaluate for intracardiac shunting. Agitated saline contrast bubble study was positive with shunting observed within 3-6 cardiac cycles suggestive of interatrial shunt. Additional Comments: Spectral Doppler performed. LEFT VENTRICLE PLAX 2D LVOT diam:     1.80 cm LV SV:         48 LV SV Index:   30 LVOT Area:     2.54 cm  AORTIC VALVE LVOT Vmax:   97.20 cm/s LVOT Vmean:  63.400 cm/s LVOT VTI:    0.189 m  AORTA Ao Root diam: 2.70 cm Ao Asc diam:  2.00 cm  SHUNTS Systemic VTI:  0.19 m Systemic Diam: 1.80 cm Weston Brass MD Electronically signed by Weston Brass MD Signature Date/Time: 07/09/2023/2:24:56 PM    Final    EP STUDY Result Date: 07/08/2023 See surgical note for result.  CT CHEST WO CONTRAST Result Date: 07/07/2023 CLINICAL DATA:  Dyspnea, chronic, unclear etiology Echo showing extrinsic compression of the left atrium-chest x-ray unrevealing-please  assess with CT chest. EXAM: CT CHEST WITHOUT CONTRAST TECHNIQUE: Multidetector CT imaging of the chest was performed following the standard protocol without IV contrast. Evaluation of the heart, vascular structures, and soft tissues is limited without IV contrast. RADIATION DOSE REDUCTION: This exam was performed according to the departmental dose-optimization program which includes automated exposure control, adjustment of the mA and/or kV according to patient size and/or use of iterative reconstruction technique. COMPARISON:  07/04/2023, 10/27/2019 FINDINGS: Cardiovascular: Unenhanced imaging of the heart demonstrates cardiomegaly without pericardial effusion. Evaluation of the cardiac chambers is limited without IV contrast. Normal caliber of the thoracic aorta. Vascular lumen cannot be evaluated without IV contrast. Mediastinum/Nodes: No enlarged mediastinal or axillary lymph nodes. Thyroid gland, trachea, and esophagus demonstrate no significant findings. Lungs/Pleura: Trace left pleural effusion. Scattered areas of consolidation within the dependent left upper lobe and left lower lobe, unchanged since prior study and compatible with early airspace disease or atelectasis. Right chest is clear. No pneumothorax. Central airways are patent. Upper Abdomen: Trace fluid within the upper abdomen, decreased since prior study. Musculoskeletal: No acute or destructive bony lesions. IMPRESSION: 1. Cardiomegaly without pericardial effusion. Evaluation of the heart is limited without IV contrast. There is no abnormality identified on this unenhanced exam resulting in extrinsic compression of the left atrium as reported in the clinical history. 2. Stable patchy left basilar consolidation and small left effusion, favor atelectasis over airspace disease. 3. Trace upper abdominal free fluid. Electronically Signed   By: Sharlet Salina M.D.   On: 07/07/2023 16:19   ECHOCARDIOGRAM COMPLETE Result Date: 07/06/2023     ECHOCARDIOGRAM REPORT   Patient Name:   Lindsey Murillo Date of Exam: 07/06/2023 Medical Rec #:  [id number]           Height:       68.0 in Accession #:    [id number]          Weight:       95.7 lb Date of Birth:  Dec 21, 1995  BSA:          1.494 m Patient Age:    27 years            BP:           92/60 mmHg Patient Gender: F                   HR:           97 bpm. Exam Location:  Inpatient Procedure: 2D Echo, Cardiac Doppler and Color Doppler (Both Spectral and Color            Flow Doppler were utilized during procedure). Indications:    Bacteremia  History:        Patient has prior history of Echocardiogram examinations, most                 recent 06/13/2021. Risk Factors:Diabetes and Hypertension.  Sonographer:    Amy Chionchio Referring Phys: [id number] NIKITA S DESAI IMPRESSIONS  1. Left ventricular ejection fraction, by estimation, is 50 to 55%. The left ventricle has low normal function. The left ventricle has no regional wall motion abnormalities. Left ventricular diastolic parameters are consistent with Grade II diastolic dysfunction (pseudonormalization).  2. Right ventricular systolic function is normal. The right ventricular size is normal. Tricuspid regurgitation signal is inadequate for assessing PA pressure.  3. There appears to be compression of the left atrium by structure extrinsic to the heart. Would review other chest imaging.  4. Right atrial size was mildly dilated.  5. The mitral valve is normal in structure. No evidence of mitral valve regurgitation. No evidence of mitral stenosis.  6. The aortic valve is tricuspid. Aortic valve regurgitation is not visualized. No aortic stenosis is present.  7. In one view, there was concern for vegetation on pulmonary valve, but reviewing other images, I suspect this was artifact.  8. IVC not visualized. FINDINGS  Left Ventricle: Left ventricular ejection fraction, by estimation, is 50 to 55%. The left ventricle has low normal function. The left  ventricle has no regional wall motion abnormalities. The left ventricular internal cavity size was normal in size. There is no left ventricular hypertrophy. Left ventricular diastolic parameters are consistent with Grade II diastolic dysfunction (pseudonormalization). Right Ventricle: The right ventricular size is normal. No increase in right ventricular wall thickness. Right ventricular systolic function is normal. Tricuspid regurgitation signal is inadequate for assessing PA pressure. Left Atrium: There appears to be compression of the left atrium by structure extrinsic to the heart. Would review other chest imaging. Left atrial size was normal in size. Right Atrium: Right atrial size was mildly dilated. Pericardium: Trivial pericardial effusion is present. Mitral Valve: The mitral valve is normal in structure. No evidence of mitral valve regurgitation. No evidence of mitral valve stenosis. MV peak gradient, 4.7 mmHg. The mean mitral valve gradient is 2.0 mmHg. Tricuspid Valve: The tricuspid valve is normal in structure. Tricuspid valve regurgitation is not demonstrated. Aortic Valve: The aortic valve is tricuspid. Aortic valve regurgitation is not visualized. No aortic stenosis is present. Aortic valve mean gradient measures 3.0 mmHg. Aortic valve peak gradient measures 5.3 mmHg. Aortic valve area, by VTI measures 2.22 cm. Pulmonic Valve: The pulmonic valve was not well visualized. Pulmonic valve regurgitation is trivial. Aorta: The aortic root is normal in size and structure. Venous: IVC not visualized. IAS/Shunts: No atrial level shunt detected by color flow Doppler.  LEFT VENTRICLE PLAX 2D LVIDd:         3.60  cm   Diastology LVIDs:         2.90 cm   LV e' medial:    6.09 cm/s LV PW:         1.10 cm   LV E/e' medial:  10.9 LV IVS:        1.10 cm   LV e' lateral:   5.87 cm/s LVOT diam:     1.90 cm   LV E/e' lateral: 11.3 LV SV:         37 LV SV Index:   25 LVOT Area:     2.84 cm  RIGHT VENTRICLE RV Basal diam:   3.25 cm RV S prime:     12.10 cm/s TAPSE (M-mode): 1.6 cm LEFT ATRIUM            Index        RIGHT ATRIUM           Index LA Vol (A4C): 130.0 ml 87.02 ml/m  RA Area:     19.00 cm                                     RA Volume:   50.55 ml  33.84 ml/m  AORTIC VALVE                    PULMONIC VALVE AV Area (Vmax):    2.02 cm     PV Vmax:          0.98 m/s AV Area (Vmean):   1.92 cm     PV Peak grad:     3.8 mmHg AV Area (VTI):     2.22 cm     PR End Diast Vel: 5.11 msec AV Vmax:           115.00 cm/s AV Vmean:          81.900 cm/s AV VTI:            0.166 m AV Peak Grad:      5.3 mmHg AV Mean Grad:      3.0 mmHg LVOT Vmax:         82.00 cm/s LVOT Vmean:        55.400 cm/s LVOT VTI:          0.130 m LVOT/AV VTI ratio: 0.78  AORTA Ao Root diam: 2.80 cm Ao Asc diam:  2.30 cm MITRAL VALVE MV Area (PHT): 5.12 cm    SHUNTS MV Area VTI:   1.65 cm    Systemic VTI:  0.13 m MV Peak grad:  4.7 mmHg    Systemic Diam: 1.90 cm MV Mean grad:  2.0 mmHg MV Vmax:       1.08 m/s MV Vmean:      75.9 cm/s MV E velocity: 66.50 cm/s MV A velocity: 56.80 cm/s MV E/A ratio:  1.17 Dalton McleanMD Electronically signed by Wilfred Lacy Signature Date/Time: 07/06/2023/10:43:25 AM    Final    CT ABDOMEN PELVIS WO CONTRAST Result Date: 07/04/2023 CLINICAL DATA:  Acute nonlocalized abdominal pain after peritoneal dialysis. Nausea, vomiting, and diarrhea. EXAM: CT ABDOMEN AND PELVIS WITHOUT CONTRAST TECHNIQUE: Multidetector CT imaging of the abdomen and pelvis was performed following the standard protocol without IV contrast. RADIATION DOSE REDUCTION: This exam was performed according to the departmental dose-optimization program which includes automated exposure control, adjustment of the mA and/or kV according to patient size and/or use of iterative  reconstruction technique. COMPARISON:  12/01/2022 FINDINGS: Lower chest: Small left pleural effusion. Atelectasis or infiltration demonstrated in both lung bases. Cardiac enlargement.  Hepatobiliary: No focal liver abnormality is seen. No gallstones, gallbladder wall thickening, or biliary dilatation. Pancreas: Diffuse pancreatic atrophy with scattered calcifications consistent with evidence of chronic pancreatitis. No acute inflammatory changes appreciated. Spleen: Normal in size without focal abnormality. Adrenals/Urinary Tract: No adrenal gland nodules. Kidneys are symmetrical. No hydronephrosis or hydroureter. Bladder is decompressed but there appears to be bladder wall thickening. This could be due to under distention or cystitis. Stomach/Bowel: Stomach is fluid-filled without distention. Small bowel and colon are mostly decompressed. Decompression limits evaluation but there is evidence of diffuse wall thickening in the small bowel and colon. This may indicate enterocolitis or reactive bowel wall thickening. No pneumatosis. Vascular/Lymphatic: Aortic atherosclerosis. No enlarged abdominal or pelvic lymph nodes. Reproductive: Uterus and bilateral adnexa are unremarkable. Other: Moderate free fluid in the abdomen with small amount of free air likely related to peritoneal dialysis. A peritoneal dialysis catheter is present, coiled in the pelvis. No loculated collections are identified. Prominent lymph nodes in the groin regions bilaterally. Largest on the right measures 2.6 cm diameter is incompletely included. This is similar to the previous study. Differential diagnosis could include inflammatory or reactive nodes versus lymphoproliferative process. Musculoskeletal: No acute bony abnormalities. IMPRESSION: 1. Moderate free fluid throughout the abdomen and pelvis with small amount of free air likely related to peritoneal dialysis. 2. Peritoneal dialysis catheter appears in place. No loculated collections. 3. Small and large bowel are decompressed but there is suggestion of wall thickening, possibly enterocolitis. 4. Bladder wall thickening may be due to under distention or cystitis. 5.  Lymphadenopathy in the groin regions is similar to prior study, nonspecific. 6. Aortic atherosclerosis. 7. Chronic pancreatitis. 8. Infiltrates or atelectasis in both lung bases, possibly pneumonia. Small left pleural effusion. Electronically Signed   By: Burman Nieves M.D.   On: 07/04/2023 20:50   DG Chest 2 View Result Date: 07/04/2023 CLINICAL DATA:  Suspected sepsis EXAM: CHEST - 2 VIEW COMPARISON:  Chest x-ray 01/14/2023 FINDINGS: There are minimal strandy and patchy opacities in the anterior inferior left lower lobe. There is no pleural effusion or pneumothorax. The cardiomediastinal silhouette is within normal limits. No acute fractures are seen. IMPRESSION: Minimal strandy and patchy opacities in the anterior inferior left lower lobe, atelectasis versus pneumonia. Electronically Signed   By: Darliss Cheney M.D.   On: 07/04/2023 17:04    Microbiology: Results for orders placed or performed during the hospital encounter of 07/16/23  C Difficile Quick Screen w PCR reflex     Status: None   Collection Time: 07/17/23  4:57 AM   Specimen: STOOL  Result Value Ref Range Status   C Diff antigen NEGATIVE NEGATIVE Final   C Diff toxin NEGATIVE NEGATIVE Final   C Diff interpretation No C. difficile detected.  Final    Comment: Performed at [facility], [address], [place], [place] [address]  Gastrointestinal Panel by PCR , Stool     Status: None   Collection Time: 07/17/23  4:57 AM   Specimen: STOOL  Result Value Ref Range Status   Campylobacter species NOT DETECTED NOT DETECTED Final   Plesimonas shigelloides NOT DETECTED NOT DETECTED Final   Salmonella species NOT DETECTED NOT DETECTED Final   Yersinia enterocolitica NOT DETECTED NOT DETECTED Final   Vibrio species NOT DETECTED NOT DETECTED Final   Vibrio cholerae NOT DETECTED NOT DETECTED Final  Enteroaggregative E coli (EAEC) NOT DETECTED NOT DETECTED Final   Enteropathogenic E coli (EPEC) NOT DETECTED NOT DETECTED Final    Enterotoxigenic E coli (ETEC) NOT DETECTED NOT DETECTED Final   Shiga like toxin producing E coli (STEC) NOT DETECTED NOT DETECTED Final   Shigella/Enteroinvasive E coli (EIEC) NOT DETECTED NOT DETECTED Final   Cryptosporidium NOT DETECTED NOT DETECTED Final   Cyclospora cayetanensis NOT DETECTED NOT DETECTED Final   Entamoeba histolytica NOT DETECTED NOT DETECTED Final   Giardia lamblia NOT DETECTED NOT DETECTED Final   Adenovirus F40/41 NOT DETECTED NOT DETECTED Final   Astrovirus NOT DETECTED NOT DETECTED Final   Norovirus GI/GII NOT DETECTED NOT DETECTED Final   Rotavirus A NOT DETECTED NOT DETECTED Final   Sapovirus (I, II, IV, and V) NOT DETECTED NOT DETECTED Final    Comment: Performed at [facility], [address] [place], [place] [address]    Labs: CBC: Recent Labs  Lab 07/16/23 1801 07/18/23 0535 07/19/23 0516  WBC 14.1* 11.9* 11.9*  NEUTROABS  --   --  7.2  HGB 8.0* 7.9* 7.8*  HCT 24.5* 24.5* 24.6*  MCV 90.1 90.7 91.8  PLT 172 214 237   Basic Metabolic Panel: Recent Labs  Lab 07/16/23 1801 07/17/23 1617 07/18/23 0535 07/19/23 0516  NA 136  --  139 136  K 3.2*  --  3.6 3.7  CL 102  --  109 104  CO2 25  --  23 26  GLUCOSE 101*  --  52* 157*  BUN 14  --  19 10  CREATININE 4.32*  --  6.07* 4.10*  CALCIUM 7.7*  --  7.8* 7.9*  MG  --  1.9  --  2.0  PHOS  --   --   --  3.1   Liver Function Tests: Recent Labs  Lab 07/16/23 1801 07/18/23 0535  AST 20 19  ALT <5 <5  ALKPHOS 96 89  BILITOT 0.5 0.4  PROT 4.8* 4.8*  ALBUMIN <1.5* <1.5*   CBG: Recent Labs  Lab 07/19/23 1901 07/19/23 2019 07/20/23 0320 07/20/23 0739 07/20/23 1209  GLUCAP 90 175* 89 77 88    Discharge time spent: greater than 30 minutes.  Signed: Jonah Blue, MD [facility] 07/20/2023

## 2023-07-20 NOTE — Progress Notes (Signed)
 D/C order noted. Contacted [facility] to be advised of pt's d/c today and that pt should resume care tomorrow.   Olivia Canter Renal Navigator [phone]

## 2023-07-20 NOTE — Hospital Course (Signed)
 27yo with h/o T1DM, HTN, autoimmune thyroiditis, HLD, and ESRD on HD who presented with diarrhea and abdominal pain on 3/8.  She was previously admitted from 2/24-3/5 for septic shock due to PD catheter-associated peritonitis and MSSA bacteremia.  She is continuing treatment with Cefazolin for 4 weeks, through 4/1.  This time, she was found to have enterocolitis on imaging.  Her abdominal pain has improved and she is tolerating a regular diet.  Diarrhea has improved with addition of Creon and loperamide and she is stable for dc to home.

## 2023-07-20 NOTE — TOC Transition Note (Signed)
 Transition of Care [facility]) - Discharge Note   Patient Details  Name: Lindsey Murillo MRN: [id number] Date of Birth: 05-31-1995  Transition [facility]) CM/SW Contact:  Harriet Masson, RN Phone Number: 07/20/2023, 2:04 PM   Clinical Narrative:    Patient stable to discharge home.  No TOC needs at this time.    Final next level of care: Home/Self Care Barriers to Discharge: Barriers Resolved   Patient Goals and CMS Choice      Return home      Discharge Placement                 Home      Discharge Plan and Services Additional resources added to the After Visit Summary for                                       Social Drivers of Health (SDOH) Interventions SDOH Screenings   Food Insecurity: No Food Insecurity (07/18/2023)  Housing: Low Risk  (07/18/2023)  Transportation Needs: No Transportation Needs (07/18/2023)  Utilities: Not At Risk (07/18/2023)  Alcohol Screen: Low Risk  (01/14/2023)  Depression (PHQ2-9): Low Risk  (01/14/2023)  Recent Concern: Depression (PHQ2-9) - Medium Risk (10/26/2022)  Financial Resource Strain: Low Risk  (04/28/2023)   Received from [facility]  Physical Activity: Insufficiently Active (01/14/2023)  Social Connections: Moderately Isolated (01/14/2023)  Stress: No Stress Concern Present (05/24/2023)   Received from [facility]  Tobacco Use: Low Risk  (07/16/2023)  Health Literacy: Adequate Health Literacy (01/14/2023)     Readmission Risk Interventions    07/13/2023   11:28 AM  Readmission Risk Prevention Plan  Transportation Screening Complete  PCP or Specialist Appt within 3-5 Days Complete  HRI or Home Care Consult Complete  Social Work Consult for Recovery Care Planning/Counseling Complete  Palliative Care Screening Not Applicable  Medication Review Oceanographer) Referral to Pharmacy

## 2023-07-20 NOTE — Progress Notes (Addendum)
 [facility] Progress Note   Subjective:   Dialysis yesterday - no issues Feels ok, did have an episode of nausea/vomiting this morning.   Objective Vitals:   07/19/23 2019 07/19/23 2026 07/20/23 0316 07/20/23 0740  BP: (!) 76/51 101/65 101/67 105/69  Pulse: 99 (!) 105 (!) 104 98  Resp: 18 18 18    Temp: 97.8 F (36.6 C)  98.1 F (36.7 C) 98 F (36.7 C)  TempSrc: Oral  Oral   SpO2: 100%  98% 100%  Weight:      Height:       Additional Objective Labs: Basic Metabolic Panel: Recent Labs  Lab 07/16/23 1801 07/18/23 0535 07/19/23 0516  NA 136 139 136  K 3.2* 3.6 3.7  CL 102 109 104  CO2 25 23 26   GLUCOSE 101* 52* 157*  BUN 14 19 10   CREATININE 4.32* 6.07* 4.10*  CALCIUM 7.7* 7.8* 7.9*  PHOS  --   --  3.1   CBC: Recent Labs  Lab 07/16/23 1801 07/18/23 0535 07/19/23 0516  WBC 14.1* 11.9* 11.9*  NEUTROABS  --   --  7.2  HGB 8.0* 7.9* 7.8*  HCT 24.5* 24.5* 24.6*  MCV 90.1 90.7 91.8  PLT 172 214 237   Blood Culture    Component Value Date/Time   SDES PERITONEAL DIALYSATE FLUID 07/11/2023 0125   SPECREQUEST NONE 07/11/2023 0125   CULT  07/11/2023 0125    NO GROWTH 3 DAYS Performed at [facility], [address] [place], [place] [address]    REPTSTATUS 07/14/2023 FINAL 07/11/2023 0125     Physical Exam General: Alert, nad  Heart: RRR Lungs: Clear, normal wob Abdomen: soft non-tender  Extremities: 1+ LE edema  Dialysis Access: R AVF +bruit   Medications:   ceFAZolin (ANCEF) IV 1 g (07/19/23 1831)    (feeding supplement) PROSource Plus  30 mL Oral BID BM   calcitRIOL  1 mcg Oral Daily   Chlorhexidine Gluconate Cloth  6 each Topical Q0600   feeding supplement  1 Container Oral BID BM   heparin  5,000 Units Subcutaneous Q8H   insulin aspart  0-6 Units Subcutaneous TID WC   insulin glargine  3 Units Subcutaneous QHS   lipase/protease/amylase  24,000 Units Oral TID WC   loperamide  2 mg Oral Q8H   midodrine  5 mg Oral TID WC    multivitamin  1 tablet Oral QHS   pantoprazole  40 mg Oral BID AC   rosuvastatin  10 mg Oral QHS    Dialysis Orders:  TTS [facility] 4h  B400   51kg  3K bath  AVF   Heparin none - last OP HD 3/07, post wt 55.6kg (4.6kg over) - rocaltrol 2.25 mcg po three times per week - mircera 100 mcg q 2 wks, last 3/07, due 3/21  Assessment/Plan: 1. Chronic diarrhea/ Enterocolitis on CT -  GI panel neg. Management per primary/GI  2. H/o MSSA peritonitis/bacteremia - PD cathter removed during prior admit. Continue Ancef 2 g q HD until 08/10/23.  3. ESRD - Usual HD TTS. Back on schedule. Next HD Thurs.  4..Chronic hypotension - on midodrine 5 mg TID. BP ok  5. Volume - UF 2-2.5L with HD.  6.  Anemia-  Hgb 7.9. Just received ESA as OP. No IV Fe with infection. Follow trends. Transfuse prn.  7. MBD of CKD -  Corr Ca ok. Phos lowish. Holding Velphoro binder. 8. Nutrition - Add prot supp for low albumin  9. Type 1 DM - insulin per primary   Tomasa Blase PA-C [facility] 07/20/2023,9:11 AM  I saw and evaluated the patient. I agree with assessment and plan as outline above by Susann Givens, PA-C

## 2023-07-20 NOTE — Discharge Planning (Signed)
 [facility] Patient Discharge Orders- [facility] CLINIC: [facility]  Patient's name: Lindsey Murillo Admit/DC Dates: 07/16/2023 - 07/20/2023  Discharge Diagnoses: Diarrhea/Acute enterocolitis   H/o MSSA bacteremia/peritonitis   Recent Labs  Lab 07/18/23 0535 07/19/23 0516  HGB 7.9* 7.8*  K 3.6 3.7  CALCIUM 7.8* 7.9*  PHOS  --  3.1  ALBUMIN <1.5*  --    Aranesp: Given: --   Date of last dose/amount: --   PRBC's Given: -- Date/# of units: -- ESA dose for discharge -- Mircera 200 mcg IV q 2 weeks   Outpatient Dialysis Orders:  -Heparin: No change   -EDW No change    -Bath: No change   Access intervention/Change: ---   -IV Antibiotics: Continue Cefazolin 2 g IV q HD until 08/09/23 for MSSA peritonitis/bacteremia    OTHER/APPTS   Completed by: Tomasa Blase PA-C   D/C Meds to be reconciled by nurse after every discharge.    Reviewed by: MD:______ RN_______

## 2023-07-21 ENCOUNTER — Encounter: Payer: Self-pay | Admitting: *Deleted

## 2023-07-21 ENCOUNTER — Telehealth: Payer: Self-pay | Admitting: *Deleted

## 2023-07-21 DIAGNOSIS — N186 End stage renal disease: Secondary | ICD-10-CM | POA: Diagnosis not present

## 2023-07-21 DIAGNOSIS — R110 Nausea: Secondary | ICD-10-CM | POA: Diagnosis not present

## 2023-07-21 DIAGNOSIS — Z992 Dependence on renal dialysis: Secondary | ICD-10-CM | POA: Diagnosis not present

## 2023-07-21 DIAGNOSIS — K658 Other peritonitis: Secondary | ICD-10-CM | POA: Diagnosis not present

## 2023-07-21 DIAGNOSIS — D631 Anemia in chronic kidney disease: Secondary | ICD-10-CM | POA: Diagnosis not present

## 2023-07-21 DIAGNOSIS — N2581 Secondary hyperparathyroidism of renal origin: Secondary | ICD-10-CM | POA: Diagnosis not present

## 2023-07-21 DIAGNOSIS — R52 Pain, unspecified: Secondary | ICD-10-CM | POA: Diagnosis not present

## 2023-07-21 NOTE — Progress Notes (Signed)
 Complex Care Management Care Guide Note  07/21/2023 Name: AMIR GLAUS MRN: [id number] DOB: [birth year]  Daphine Deutscher Vandruff is a 28 y.o. year old female who is a primary care patient of [name], DO and is actively engaged with the care management team. I reached out to [facility] by phone today to assist with provider appointment scheduling  with the  Dr Adriana Simas  .  Follow up plan: Unsuccessful telephone outreach attempt made. A HIPAA compliant phone message was left for the patient providing contact information and requesting a return call.  Gwenevere Ghazi  [facility] Health  Value-Based Care Institute, [facility] Guide  Direct Dial: [phone]  Fax [phone]

## 2023-07-21 NOTE — Transitions of Care (Post Inpatient/ED Visit) (Signed)
 07/21/2023  Name: Lindsey Murillo MRN: [id number] DOB: Oct 19, 1995  Today's TOC FU Call Status: Today's TOC FU Call Status:: Successful TOC FU Call Completed TOC FU Call Complete Date: 07/21/23 Patient's Name and Date of Birth confirmed.  Transition Care Management Follow-up Telephone Call Date of Discharge: 07/20/23 Discharge Facility: [facility]) Type of Discharge: Inpatient Admission Primary Inpatient Discharge Diagnosis:: Colitis How have you been since you were released from the hospital?:  (eating & drinking well, still has "some diarrhea", ambulating well, states " I'm fine") Any questions or concerns?: No  Items Reviewed: Did you receive and understand the discharge instructions provided?: Yes Medications obtained,verified, and reconciled?: Yes (Medications Reviewed) Any new allergies since your discharge?: No Dietary orders reviewed?: Yes Type of Diet Ordered:: heart healthy, carbohydrate modified Do you have support at home?: Yes People in Home: sibling(s), parent(s) Name of Support/Comfort Primary Source: pt states her mother and brother live with her Patient seemed reluctant to talk today but agreed to today's phone call, declined enrollment in TOC 30 day program Reviewed signs/ symptoms colitis, reportable signs /symptoms, importance of getting in to see GI doctor, pt verbalizes understanding  Medications Reviewed Today: Medications Reviewed Today     Reviewed by Audrie Gallus, RN (Registered Nurse) on 07/21/23 at 1117  Med List Status: <None>   Medication Order Taking? Sig Documenting Provider Last Dose Status Informant  acetaminophen (TYLENOL) 325 MG tablet [id number] No Take 325 mg by mouth every 6 (six) hours as needed for mild pain (pain score 1-3) or moderate pain (pain score 4-6).  Patient not taking: Reported on 07/04/2023   [provider] Not Taking Active Self, Mother, Pharmacy Records  B Complex-C-Folic Acid (DIALYVITE 800) 0.8 MG TABS  [id number] Yes Take 1 tablet by mouth daily. [provider] Taking Active Self, Mother, Pharmacy Records           Med Note Dorisann Frames Jul 17, 2023 10:24 AM) LF 04/05/23 90d/s  calcitRIOL (ROCALTROL) 0.5 MCG capsule [id number] Yes Take 1 mcg by mouth daily. [provider] Taking Active Self, Mother, Pharmacy Records           Med Note Donell Beers Jul 04, 2023  6:25 PM) LF: 06/08/23 for a 90ds  ceFAZolin (ANCEF) 1-4 GM/50ML-% SOLN [id number] Yes Inject 100 mLs (2 g total) into the vein every dialysis for up to 27 days. Dorcas Carrow, MD Taking Active Mother, Self, Pharmacy Records  Continuous Glucose Receiver [facility] G7 [place]) [place] [id number] Yes by Does not apply route. [provider] Taking Active Self, Mother, Pharmacy Records  Continuous Glucose Sensor (DEXCOM G7 SENSOR) [place] [id number] Yes by Does not apply route. [provider] Taking Active Self, Mother, Pharmacy Records  Insulin Aspart, w/Niacinamide, (FIASP) 100 UNIT/ML SOLN [id number] Yes Inject 5-8 Units as directed with breakfast, with lunch, and with evening meal. Sliding Scale [provider] Taking Active Self, Mother, Pharmacy Records           Med Note Donell Beers Jul 04, 2023  6:45 PM) LF: 03/10/23 for a 63ds  lipase/protease/amylase (CREON) 12000-38000 units CPEP capsule [id number] Yes Take 2 capsules (24,000 Units total) by mouth 3 (three) times daily with meals. Jonah Blue, MD Taking Active   loperamide (IMODIUM) 2 MG capsule [id number] Yes Take 1 capsule (2 mg total) by mouth as needed for diarrhea or loose stools. Jonah Blue, MD Taking Active   medroxyPROGESTERone (PROVERA) 10 MG  tablet [id number] Yes Take provera 10 mg for 10 days every 3 months Adline Potter, NP Taking Active Self, Mother, Pharmacy Records           Med Note Dorisann Frames Jul 17, 2023 10:35 AM) This was filled 11/16/22 for 3 month supply. Patient still  says she "is current".  midodrine (PROAMATINE) 5 MG tablet [id number] Yes Take 1 tablet (5 mg total) by mouth 3 (three) times daily with meals. Dorcas Carrow, MD Taking Active Mother, Self, Pharmacy Records  Nutritional Supplements (,FEEDING SUPPLEMENT, PROSOURCE PLUS) liquid [id number] Yes Take 30 mLs by mouth 2 (two) times daily between meals. Jonah Blue, MD Taking Active   pantoprazole (PROTONIX) 40 MG tablet [id number]  Take 1 tablet (40 mg total) by mouth 2 (two) times daily before a meal. Aida Raider, NP  Active Self, Mother, Pharmacy Records           Med Note Donell Beers Jul 04, 2023  6:24 PM) LF: 06/21/23 for a 90ds  rosuvastatin (CRESTOR) 10 MG tablet [id number] Yes Take 1 tablet (10 mg total) by mouth daily. [name], DO Taking Active Self, Mother, Pharmacy Records           Med Note Dorisann Frames Jul 17, 2023 10:23 AM) LF 03/14/24 #90  sucroferric oxyhydroxide (VELPHORO) 500 MG chewable tablet [id number] Yes Chew 1,000 mg by mouth 3 (three) times daily with meals. [provider] Taking Active Self, Mother, Pharmacy Records           Med Note Dorisann Frames Jul 17, 2023 10:21 AM) LF 04/20/23 30d/s  TRESIBA FLEXTOUCH 100 UNIT/ML FlexTouch Pen [id number] Yes Inject 3 Units into the skin at bedtime. Jonah Blue, MD Taking Active   VENTOLIN HFA 108 [phone] Base) MCG/ACT inhaler [id number] Yes Inhale 2 puffs into the lungs every 4 (four) hours as needed for wheezing or shortness of breath. 2 puffs once every 4 hours as needed for cough, wheeze, shortness of breath and chest tightness. May use 2 puffs 5 to 15 minutes before activity to decrease cough or wheeze. Marcelyn Bruins, MD Taking Active Self, Mother, Pharmacy Records  Med List Note Lars Masson 07/04/23 2025): Patient has peritoneal dialysis at home, will be starting on her cycler soon.            Home Care and Equipment/Supplies: Were Home Health Services  Ordered?: No Any new equipment or medical supplies ordered?: No  Functional Questionnaire: Do you need assistance with bathing/showering or dressing?: No Do you need assistance with meal preparation?: No Do you need assistance with eating?: No Do you have difficulty maintaining continence: No Do you need assistance with getting out of bed/getting out of a chair/moving?: No Do you have difficulty managing or taking your medications?: No  Follow up appointments reviewed: PCP Follow-up appointment confirmed?: No (sent care guide Mervin Hack message (both careguides are busy), requesting post hospital follow up appointment be scheduled within 1-2 weeks and call pt back with appointment date and time) MD Provider Line [phone] Given: No Specialist Hospital Follow-up appointment confirmed?: Yes (pt states she will call GI and schedule follow up appointment) Date of Specialist follow-up appointment?: 07/28/23 Follow-Up Specialty Provider:: [facility] @1045  am Do you need transportation to your follow-up appointment?: No Do you understand care options if your condition(s) worsen?: Yes-patient verbalized understanding  SDOH Interventions Today    Flowsheet Row Most  Recent Value  SDOH Interventions   Food Insecurity Interventions Intervention Not Indicated  Housing Interventions Intervention Not Indicated  Transportation Interventions Intervention Not Indicated  Utilities Interventions Intervention Not Indicated       Irving Shows [facility], BSN RN Care Manager/ Transition of Care [place] [phone]

## 2023-07-22 NOTE — Progress Notes (Signed)
 Complex Care Management Care Guide Note  07/22/2023 Name: Lindsey Murillo MRN: [id number] DOB: Sep 05, 1995  Lindsey Murillo is a 28 y.o. year old female who is a primary care patient of [name], DO and is actively engaged with the care management team. I reached out to [facility] by phone today to assist with provider appointment scheduling  with the  Dr. Adriana Simas .  Follow up plan: Unsuccessful telephone outreach attempt made. A HIPAA compliant phone message was left for the patient providing contact information and requesting a return call.  Gwenevere Ghazi  [facility] Health  Value-Based Care Institute, [facility] Guide  Direct Dial: [phone]  Fax [phone]

## 2023-07-23 DIAGNOSIS — K658 Other peritonitis: Secondary | ICD-10-CM | POA: Diagnosis not present

## 2023-07-23 DIAGNOSIS — D631 Anemia in chronic kidney disease: Secondary | ICD-10-CM | POA: Diagnosis not present

## 2023-07-23 DIAGNOSIS — Z992 Dependence on renal dialysis: Secondary | ICD-10-CM | POA: Diagnosis not present

## 2023-07-23 DIAGNOSIS — R110 Nausea: Secondary | ICD-10-CM | POA: Diagnosis not present

## 2023-07-23 DIAGNOSIS — N186 End stage renal disease: Secondary | ICD-10-CM | POA: Diagnosis not present

## 2023-07-23 DIAGNOSIS — N2581 Secondary hyperparathyroidism of renal origin: Secondary | ICD-10-CM | POA: Diagnosis not present

## 2023-07-23 DIAGNOSIS — R52 Pain, unspecified: Secondary | ICD-10-CM | POA: Diagnosis not present

## 2023-07-25 DIAGNOSIS — E1165 Type 2 diabetes mellitus with hyperglycemia: Secondary | ICD-10-CM | POA: Diagnosis not present

## 2023-07-26 DIAGNOSIS — N186 End stage renal disease: Secondary | ICD-10-CM | POA: Diagnosis not present

## 2023-07-26 DIAGNOSIS — R52 Pain, unspecified: Secondary | ICD-10-CM | POA: Diagnosis not present

## 2023-07-26 DIAGNOSIS — K658 Other peritonitis: Secondary | ICD-10-CM | POA: Diagnosis not present

## 2023-07-26 DIAGNOSIS — R110 Nausea: Secondary | ICD-10-CM | POA: Diagnosis not present

## 2023-07-26 DIAGNOSIS — Z992 Dependence on renal dialysis: Secondary | ICD-10-CM | POA: Diagnosis not present

## 2023-07-26 DIAGNOSIS — N2581 Secondary hyperparathyroidism of renal origin: Secondary | ICD-10-CM | POA: Diagnosis not present

## 2023-07-26 DIAGNOSIS — D631 Anemia in chronic kidney disease: Secondary | ICD-10-CM | POA: Diagnosis not present

## 2023-07-28 ENCOUNTER — Encounter

## 2023-07-28 DIAGNOSIS — Z992 Dependence on renal dialysis: Secondary | ICD-10-CM | POA: Diagnosis not present

## 2023-07-28 DIAGNOSIS — N2581 Secondary hyperparathyroidism of renal origin: Secondary | ICD-10-CM | POA: Diagnosis not present

## 2023-07-28 DIAGNOSIS — K658 Other peritonitis: Secondary | ICD-10-CM | POA: Diagnosis not present

## 2023-07-28 DIAGNOSIS — R52 Pain, unspecified: Secondary | ICD-10-CM | POA: Diagnosis not present

## 2023-07-28 DIAGNOSIS — R110 Nausea: Secondary | ICD-10-CM | POA: Diagnosis not present

## 2023-07-28 DIAGNOSIS — D631 Anemia in chronic kidney disease: Secondary | ICD-10-CM | POA: Diagnosis not present

## 2023-07-28 DIAGNOSIS — N186 End stage renal disease: Secondary | ICD-10-CM | POA: Diagnosis not present

## 2023-07-28 NOTE — Progress Notes (Signed)
 Complex Care Management Care Guide Note  07/28/2023 Name: Lindsey Murillo MRN: [id number] DOB: [birth year]  Lindsey Murillo is a 28 y.o. year old female who is a primary care patient of [name], DO and is actively engaged with the care management team. I reached out to [facility] by phone today to assist with provider appointment scheduling  with the  Dr Adriana Simas  .  Follow up plan: Unsuccessful telephone outreach attempt made. A HIPAA compliant phone message was left for the patient providing contact information and requesting a return call.No further outreach attempts will be made at this time. We have been unable to contact the patient to schedule for hospital follow up.  Lindsey Murillo  [facility] Health  Value-Based Care Institute, [facility] Guide  Direct Dial: [phone]  Fax [phone]

## 2023-07-30 DIAGNOSIS — N186 End stage renal disease: Secondary | ICD-10-CM | POA: Diagnosis not present

## 2023-07-30 DIAGNOSIS — Z992 Dependence on renal dialysis: Secondary | ICD-10-CM | POA: Diagnosis not present

## 2023-07-30 DIAGNOSIS — N2581 Secondary hyperparathyroidism of renal origin: Secondary | ICD-10-CM | POA: Diagnosis not present

## 2023-07-30 DIAGNOSIS — K658 Other peritonitis: Secondary | ICD-10-CM | POA: Diagnosis not present

## 2023-07-30 DIAGNOSIS — R110 Nausea: Secondary | ICD-10-CM | POA: Diagnosis not present

## 2023-07-30 DIAGNOSIS — D631 Anemia in chronic kidney disease: Secondary | ICD-10-CM | POA: Diagnosis not present

## 2023-07-30 DIAGNOSIS — R52 Pain, unspecified: Secondary | ICD-10-CM | POA: Diagnosis not present

## 2023-08-01 ENCOUNTER — Ambulatory Visit (INDEPENDENT_AMBULATORY_CARE_PROVIDER_SITE_OTHER): Admitting: Podiatry

## 2023-08-01 ENCOUNTER — Encounter: Payer: Self-pay | Admitting: Podiatry

## 2023-08-01 DIAGNOSIS — E10621 Type 1 diabetes mellitus with foot ulcer: Secondary | ICD-10-CM | POA: Diagnosis not present

## 2023-08-01 NOTE — Progress Notes (Unsigned)
 Chief Complaint  Patient presents with   Diabetic Ulcer    "It's good."   HPI: 28 y.o. female presents today for follow-up of ulcer right submet 1.  Patient denies drainage at this time.  She has not picked up her custom orthotics yet.  States that there has been health issues going on and she has not had time to complete this task.  States that she was recently in the hospital for fluid overload.  Notes that she will be going to [place] for her birthday in April.  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 10/21/2021   Diabetic autonomic neuropathy ([name])    Diabetic peripheral neuropathy ([name])    Environmental allergies    Goiter    History of blood transfusion    Hypertension    no meds currently   Hypoglycemia associated with diabetes ([name])    Musculoskeletal pain 04/20/2022   Tachycardia    Thyroiditis, autoimmune    Type 1 diabetes mellitus in patient age 43-19 years with HbA1C goal below 7.5     Past Surgical History:  Procedure Laterality Date   AV FISTULA PLACEMENT Right 12/08/2021   Procedure: ARTERIOVENOUS  FISTULA CREATION VERSUS GRAFT;  Surgeon: Larina Earthly, MD;  Location: [facility];  Service: Vascular;  Laterality: Right;   BASCILIC VEIN TRANSPOSITION Right 01/12/2022   Procedure: RIGHT ARM SECOND STAGE BASILIC VEIN TRANSPOSITION;  Surgeon: Larina Earthly, MD;  Location: [facility];  Service: Vascular;  Laterality: Right;   BIOPSY  08/27/2016   Procedure: BIOPSY;  Surgeon: West Bali, MD;  Location: [facility];  Service: Endoscopy;;  duodenum; gastric   BIOPSY  06/27/2019   Procedure: BIOPSY;  Surgeon: Corbin Ade, MD;  Location: [facility];  Service: Endoscopy;;   BIOPSY  02/09/2022   Procedure: BIOPSY;  Surgeon: Lanelle Bal, DO;  Location: [facility];  Service: Endoscopy;;   BREAST CYST EXCISION Right 11/12/2021   Procedure: RIGHT BREAST ABSCESS INCISION AND DRAINAGE;  Surgeon: Emelia Loron, MD;  Location: [facility] OR;  Service: General;  Laterality: Right;  LMA   CAPD REMOVAL N/A 07/12/2023   Procedure: REMOVAL, CATHETER, CAPD;  Surgeon: Victorino Sparrow, MD;  Location: [facility] OR;  Service: Vascular;  Laterality: N/A;   COLONOSCOPY     COLONOSCOPY WITH PROPOFOL N/A 02/09/2022   Procedure: COLONOSCOPY WITH PROPOFOL;  Surgeon: Lanelle Bal, DO;  Location: [facility];  Service: Endoscopy;  Laterality: N/A;  7:30am, asa 3, dialysis pt   ESOPHAGOGASTRODUODENOSCOPY N/A 08/27/2016   Dr. Darrick Penna: mild gastritis. Negative celiac. No obvious source for dyspepsia/diarrhea   ESOPHAGOGASTRODUODENOSCOPY (EGD) WITH PROPOFOL N/A 06/27/2019   rourk: Focal abnormality of the gastric mucosa likely due to trauma (heaving).  Biopsy showed mild gastritis, negative for H. pylori.  Esophageal dilation for history of dysphagia but normal-appearing esophagus.   ESOPHAGOGASTRODUODENOSCOPY (EGD) WITH PROPOFOL N/A 02/09/2022   Procedure: ESOPHAGOGASTRODUODENOSCOPY (EGD) WITH PROPOFOL;  Surgeon: Lanelle Bal, DO;  [facility];  Service: Endoscopy;  Laterality: N/A;   IR FLUORO GUIDE CV LINE RIGHT  10/13/2021   IR FLUORO GUIDE CV LINE RIGHT  10/19/2021   IR US GUIDE VASC ACCESS RIGHT  10/13/2021   IRRIGATION AND DEBRIDEMENT  ABSCESS N/A 10/04/2021   Procedure: IRRIGATION AND DEBRIDEMENT NECK ABSCESS;  Surgeon: Axel Filler, MD;  Location: [facility] OR;  Service: General;  Laterality: N/A;   TRANSESOPHAGEAL ECHOCARDIOGRAM (CATH LAB) N/A 07/08/2023   Procedure: TRANSESOPHAGEAL ECHOCARDIOGRAM;  Surgeon: Parke Poisson, MD;  Location: [facility];  Service: Cardiovascular;  Laterality: N/A;   No Known Allergies   Physical Exam: Palpable pedal pulses noted.  The ulcer right submet 1 is closed today.  There is minimal  hemorrhagic callus noted.  This is a big improvement since previous visits.  No surrounding erythema or edema is noted.  No pain on palpation is noted.  The patient does have peripheral neuropathy.  Assessment/Plan of Care: 1. Type 1 diabetes mellitus with foot ulcer (CODE) ([name])    Discussed clinical findings with patient today.  The hemorrhagic hyperkeratotic tissue was debrided with a sterile #[id number] blade uneventfully.  Urged patient to try to get the custom orthotics with the offloaded submet 1 area prior to her trip to [place] as I would expect she will be doing a lot of walking.  I will follow-up with the patient upon her return from [place].   Clerance Lav, DPM, FACFAS [facility]     [address] [place], [place] [address]                Office [phone]  Fax [phone]

## 2023-08-03 ENCOUNTER — Telehealth: Payer: Self-pay

## 2023-08-03 DIAGNOSIS — N186 End stage renal disease: Secondary | ICD-10-CM | POA: Diagnosis not present

## 2023-08-03 DIAGNOSIS — R52 Pain, unspecified: Secondary | ICD-10-CM | POA: Diagnosis not present

## 2023-08-03 DIAGNOSIS — R110 Nausea: Secondary | ICD-10-CM | POA: Diagnosis not present

## 2023-08-03 DIAGNOSIS — D631 Anemia in chronic kidney disease: Secondary | ICD-10-CM | POA: Diagnosis not present

## 2023-08-03 DIAGNOSIS — N2581 Secondary hyperparathyroidism of renal origin: Secondary | ICD-10-CM | POA: Diagnosis not present

## 2023-08-03 DIAGNOSIS — K658 Other peritonitis: Secondary | ICD-10-CM | POA: Diagnosis not present

## 2023-08-03 DIAGNOSIS — Z992 Dependence on renal dialysis: Secondary | ICD-10-CM | POA: Diagnosis not present

## 2023-08-03 NOTE — Telephone Encounter (Signed)
 Reason for CRM: The patient called in to request if she could get a   prescription for a walker with a seat on it. She says it would really help her with her diabetes and when she has to take dialysis. She states [facility] would be really good if it can be called in there for her. Please assist patient further

## 2023-08-04 DIAGNOSIS — Z992 Dependence on renal dialysis: Secondary | ICD-10-CM | POA: Diagnosis not present

## 2023-08-04 DIAGNOSIS — R110 Nausea: Secondary | ICD-10-CM | POA: Diagnosis not present

## 2023-08-04 DIAGNOSIS — D631 Anemia in chronic kidney disease: Secondary | ICD-10-CM | POA: Diagnosis not present

## 2023-08-04 DIAGNOSIS — N2581 Secondary hyperparathyroidism of renal origin: Secondary | ICD-10-CM | POA: Diagnosis not present

## 2023-08-04 DIAGNOSIS — R52 Pain, unspecified: Secondary | ICD-10-CM | POA: Diagnosis not present

## 2023-08-04 DIAGNOSIS — K658 Other peritonitis: Secondary | ICD-10-CM | POA: Diagnosis not present

## 2023-08-04 DIAGNOSIS — N186 End stage renal disease: Secondary | ICD-10-CM | POA: Diagnosis not present

## 2023-08-06 DIAGNOSIS — Z992 Dependence on renal dialysis: Secondary | ICD-10-CM | POA: Diagnosis not present

## 2023-08-06 DIAGNOSIS — R52 Pain, unspecified: Secondary | ICD-10-CM | POA: Diagnosis not present

## 2023-08-06 DIAGNOSIS — D631 Anemia in chronic kidney disease: Secondary | ICD-10-CM | POA: Diagnosis not present

## 2023-08-06 DIAGNOSIS — R110 Nausea: Secondary | ICD-10-CM | POA: Diagnosis not present

## 2023-08-06 DIAGNOSIS — N2581 Secondary hyperparathyroidism of renal origin: Secondary | ICD-10-CM | POA: Diagnosis not present

## 2023-08-06 DIAGNOSIS — N186 End stage renal disease: Secondary | ICD-10-CM | POA: Diagnosis not present

## 2023-08-06 DIAGNOSIS — K658 Other peritonitis: Secondary | ICD-10-CM | POA: Diagnosis not present

## 2023-08-08 ENCOUNTER — Telehealth: Payer: Self-pay

## 2023-08-08 DIAGNOSIS — Z992 Dependence on renal dialysis: Secondary | ICD-10-CM | POA: Diagnosis not present

## 2023-08-08 DIAGNOSIS — N186 End stage renal disease: Secondary | ICD-10-CM | POA: Diagnosis not present

## 2023-08-08 DIAGNOSIS — N032 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: Secondary | ICD-10-CM | POA: Diagnosis not present

## 2023-08-08 NOTE — Telephone Encounter (Signed)
 Spoke with Melody at [facility] and confirmed they have end date of 4/1.  Sandie Ano, RN

## 2023-08-08 NOTE — Telephone Encounter (Signed)
 Patient called to reschedule 4/3 appointment since she has dialysis on Tues/Thurs. Scheduled for next available that worked with her schedule, 4/9 with Dr. Thedore Mins.   Patient scheduled to complete IV cefazolin 4/1 at [facility] in [place].   [place] P: [phone] F: [phone]  Sandie Ano, RN

## 2023-08-09 DIAGNOSIS — N2581 Secondary hyperparathyroidism of renal origin: Secondary | ICD-10-CM | POA: Diagnosis not present

## 2023-08-09 DIAGNOSIS — R52 Pain, unspecified: Secondary | ICD-10-CM | POA: Diagnosis not present

## 2023-08-09 DIAGNOSIS — N186 End stage renal disease: Secondary | ICD-10-CM | POA: Diagnosis not present

## 2023-08-09 DIAGNOSIS — K658 Other peritonitis: Secondary | ICD-10-CM | POA: Diagnosis not present

## 2023-08-09 DIAGNOSIS — E1022 Type 1 diabetes mellitus with diabetic chronic kidney disease: Secondary | ICD-10-CM | POA: Diagnosis not present

## 2023-08-09 DIAGNOSIS — D631 Anemia in chronic kidney disease: Secondary | ICD-10-CM | POA: Diagnosis not present

## 2023-08-09 DIAGNOSIS — R110 Nausea: Secondary | ICD-10-CM | POA: Diagnosis not present

## 2023-08-09 DIAGNOSIS — Z992 Dependence on renal dialysis: Secondary | ICD-10-CM | POA: Diagnosis not present

## 2023-08-11 ENCOUNTER — Ambulatory Visit: Payer: 59 | Admitting: Allergy

## 2023-08-11 ENCOUNTER — Inpatient Hospital Stay: Payer: Self-pay | Admitting: Internal Medicine

## 2023-08-11 DIAGNOSIS — R110 Nausea: Secondary | ICD-10-CM | POA: Diagnosis not present

## 2023-08-11 DIAGNOSIS — E1022 Type 1 diabetes mellitus with diabetic chronic kidney disease: Secondary | ICD-10-CM | POA: Diagnosis not present

## 2023-08-11 DIAGNOSIS — R52 Pain, unspecified: Secondary | ICD-10-CM | POA: Diagnosis not present

## 2023-08-11 DIAGNOSIS — N186 End stage renal disease: Secondary | ICD-10-CM | POA: Diagnosis not present

## 2023-08-11 DIAGNOSIS — K658 Other peritonitis: Secondary | ICD-10-CM | POA: Diagnosis not present

## 2023-08-11 DIAGNOSIS — N2581 Secondary hyperparathyroidism of renal origin: Secondary | ICD-10-CM | POA: Diagnosis not present

## 2023-08-11 DIAGNOSIS — D631 Anemia in chronic kidney disease: Secondary | ICD-10-CM | POA: Diagnosis not present

## 2023-08-11 DIAGNOSIS — Z992 Dependence on renal dialysis: Secondary | ICD-10-CM | POA: Diagnosis not present

## 2023-08-13 DIAGNOSIS — R110 Nausea: Secondary | ICD-10-CM | POA: Diagnosis not present

## 2023-08-13 DIAGNOSIS — D631 Anemia in chronic kidney disease: Secondary | ICD-10-CM | POA: Diagnosis not present

## 2023-08-13 DIAGNOSIS — E1022 Type 1 diabetes mellitus with diabetic chronic kidney disease: Secondary | ICD-10-CM | POA: Diagnosis not present

## 2023-08-13 DIAGNOSIS — K658 Other peritonitis: Secondary | ICD-10-CM | POA: Diagnosis not present

## 2023-08-13 DIAGNOSIS — N186 End stage renal disease: Secondary | ICD-10-CM | POA: Diagnosis not present

## 2023-08-13 DIAGNOSIS — R52 Pain, unspecified: Secondary | ICD-10-CM | POA: Diagnosis not present

## 2023-08-13 DIAGNOSIS — Z992 Dependence on renal dialysis: Secondary | ICD-10-CM | POA: Diagnosis not present

## 2023-08-13 DIAGNOSIS — N2581 Secondary hyperparathyroidism of renal origin: Secondary | ICD-10-CM | POA: Diagnosis not present

## 2023-08-15 ENCOUNTER — Encounter (HOSPITAL_COMMUNITY): Payer: Self-pay | Admitting: Hematology

## 2023-08-15 ENCOUNTER — Ambulatory Visit (INDEPENDENT_AMBULATORY_CARE_PROVIDER_SITE_OTHER): Admitting: Family Medicine

## 2023-08-15 ENCOUNTER — Other Ambulatory Visit: Payer: Self-pay

## 2023-08-15 ENCOUNTER — Encounter: Payer: Self-pay | Admitting: Family Medicine

## 2023-08-15 VITALS — BP 160/92 | HR 101 | Temp 97.8°F | Resp 18 | Ht 69.0 in | Wt 125.6 lb

## 2023-08-15 DIAGNOSIS — J3089 Other allergic rhinitis: Secondary | ICD-10-CM | POA: Diagnosis not present

## 2023-08-15 DIAGNOSIS — J4540 Moderate persistent asthma, uncomplicated: Secondary | ICD-10-CM | POA: Diagnosis not present

## 2023-08-15 DIAGNOSIS — H1013 Acute atopic conjunctivitis, bilateral: Secondary | ICD-10-CM | POA: Insufficient documentation

## 2023-08-15 DIAGNOSIS — J309 Allergic rhinitis, unspecified: Secondary | ICD-10-CM | POA: Insufficient documentation

## 2023-08-15 MED ORDER — RYALTRIS 665-25 MCG/ACT NA SUSP: 2.0000 | Freq: Two times a day (BID) | NASAL | Status: AC | PRN

## 2023-08-15 NOTE — Progress Notes (Signed)
 Medication Samples have been provided to the patient.  Drug name: Ryaltris       Strength: 665/25 mcg        Qty: 1 LOT: [id number]  Exp.Date: 08/07/24  Dosing instructions: Begin Ryaltris 2 sprays in each nostril twice a day as needed for a stuffy nose.   The patient has been instructed regarding the correct time, dose, and frequency of taking this medication, including desired effects and most common side effects.   Riley Nearing 12:19 PM 08/15/2023

## 2023-08-15 NOTE — Patient Instructions (Addendum)
 Asthma Increase Arnuity 100 (orange top) to1 puff once a day to prevent cough or wheeze Begin albuterol 2 puffs once every 4 hours as needed for cough or wheeze. Look on the inhaler or on the canister to see the name albuterol You may use albuterol 2 puffs 5 to 15 minutes before activity to decrease cough or wheeze  Allergic rhinitis Continue allergen avoidance measures directed toward dust mite and cockroach as listed below Continue cetirizine 1.25 mg twice a day as needed for runny nose or itch.  Begin Ryaltris 2 sprays in each nostril twice a day as needed for a stuffy nose.  In the right nostril, point the applicator out toward the right ear. In the left nostril, point the applicator out toward the left ear Consider saline nasal rinses as needed for nasal symptoms. Use this before any medicated nasal sprays for best result  Allergic conjunctivitis Some over the counter eye drops include Pataday one drop in each eye once a day as needed for red, itchy eyes OR Zaditor one drop in each eye twice a day as needed for red itchy eyes. Avoid eye drops that say red eye relief as they may contain medications that dry out your eyes.   Call the clinic if this treatment plan is not working well for you.  Follow up in 3 months or sooner if needed.   Control of Dust Mite Allergen Dust mites play a major role in allergic asthma and rhinitis. They occur in environments with high humidity wherever human skin is found. Dust mites absorb humidity from the atmosphere (ie, they do not drink) and feed on organic matter (including shed human and animal skin). Dust mites are a microscopic type of insect that you cannot see with the naked eye. High levels of dust mites have been detected from mattresses, pillows, carpets, upholstered furniture, bed covers, clothes, soft toys and any woven material. The principal allergen of the dust mite is found in its feces. A gram of dust may contain 1,000 mites and 250,000 fecal  particles. Mite antigen is easily measured in the air during house cleaning activities. Dust mites do not bite and do not cause harm to humans, other than by triggering allergies/asthma.  Ways to decrease your exposure to dust mites in your home:  1. Encase mattresses, box springs and pillows with a mite-impermeable barrier or cover  2. Wash sheets, blankets and drapes weekly in hot water (130 F) with detergent and dry them in a dryer on the hot setting.  3. Have the room cleaned frequently with a vacuum cleaner and a damp dust-mop. For carpeting or rugs, vacuuming with a vacuum cleaner equipped with a high-efficiency particulate air (HEPA) filter. The dust mite allergic individual should not be in a room which is being cleaned and should wait 1 hour after cleaning before going into the room.  4. Do not sleep on upholstered furniture (eg, couches).  5. If possible removing carpeting, upholstered furniture and drapery from the home is ideal. Horizontal blinds should be eliminated in the rooms where the person spends the most time (bedroom, study, television room). Washable vinyl, roller-type shades are optimal.  6. Remove all non-washable stuffed toys from the bedroom. Wash stuffed toys weekly like sheets and blankets above.  7. Reduce indoor humidity to less than 50%. Inexpensive humidity monitors can be purchased at most hardware stores. Do not use a humidifier as can make the problem worse and are not recommended.  Control of Cockroach Allergen Cockroach  allergen has been identified as an important cause of acute attacks of asthma, especially in urban settings.  There are fifty-five species of cockroach that exist in the [place], however only three, the [place], [place] species produce allergen that can affect patients with Asthma.  Allergens can be obtained from fecal particles, egg casings and secretions from cockroaches.    Remove food sources. Reduce access to  water. Seal access and entry points. Spray runways with 0.5-1% Diazinon or Chlorpyrifos Blow boric acid power under stoves and refrigerator. Place bait stations (hydramethylnon) at feeding sites.

## 2023-08-15 NOTE — Progress Notes (Signed)
 [address] [place] [address] Dept: [phone]  FOLLOW UP NOTE  Patient ID: Lindsey Murillo, female    DOB: [birth year]  Age: 28 y.o. MRN: [id number] Date of Office Visit: 08/15/2023  Assessment  Chief Complaint: Asthma (4 mth f/u - Okay), Allergic Rhinitis  (4 mth f/u- Okay), and Allergic Conjunctivitis  (4 mth f/u- Soso)  HPI Lindsey Murillo is a 28 year old female who presents to the clinic for a follow-up visit.  She was last seen in this clinic on 03/24/2023 by Dr. Delorse Lek for evaluation of asthma, allergic rhinitis, and allergic conjunctivitis.  Her current problem list is significant for ESRD with peritoneal dialysis, anemia, and type 1 diabetes.   At today's visit, she reports her asthma has been moderately well-controlled with symptoms including intermittent, random shortness of breath and intermittent cough sometimes reported as dry and occasionally producing thin clear mucus.  She continues Arnuity 100 about 2 to 3 days a week and reports that she is not currently using her albuterol as she lost this medication about 1 month ago.  She reports that albuterol usually provides relief of asthma symptoms.  Of note, absolute eosinophil count on 07/19/2023 was 400.  Allergic rhinitis is reported as moderately well-controlled with symptoms including occasional rhinorrhea, occasional nasal congestion, frequent sneezing, and intermittent postnasal drainage.  She continues cetirizine 1.25 mg once a day if needed and occasionally uses saline nasal rinses.  She reports that she did not receive Ryaltris from the pharmacy at her last visit to this clinic.  She reports that she is using dust mite covers on her mattress and pillows at this time.  Allergic conjunctivitis is reported as well-controlled with no symptoms including red or itchy eyes.  She is not currently using any medical intervention for allergic conjunctivitis.  Her current medications are listed in the chart.  Drug  Allergies:  No Known Allergies  Physical Exam: BP (!) 160/92 (BP Location: Left Arm, Patient Position: Sitting, Cuff Size: Normal) Comment: Patient stated Nephrologist prescribed BP med due to hypotension - took bp med about 30 min prior to appt. MBM  Pulse (!) 101   Temp 97.8 F (36.6 C) (Temporal)   Resp 18   Ht 5\' 9"  (1.753 m)   Wt 125 lb 9.6 oz (57 kg)   SpO2 98%   BMI 18.55 kg/m    Physical Exam Vitals reviewed.  Constitutional:      Appearance: Normal appearance.  HENT:     Head: Normocephalic and atraumatic.     Right Ear: Tympanic membrane normal.     Left Ear: Tympanic membrane normal.     Nose:     Comments: Bilateral nares edematous and pale with thin clear nasal drainage noted.  Pharynx normal.  Ears normal.  Eyes normal.    Mouth/Throat:     Pharynx: Oropharynx is clear.  Eyes:     Conjunctiva/sclera: Conjunctivae normal.  Cardiovascular:     Rate and Rhythm: Normal rate and regular rhythm.     Heart sounds: Normal heart sounds. No murmur heard. Pulmonary:     Effort: Pulmonary effort is normal.     Breath sounds: Normal breath sounds.     Comments: Lungs clear to auscultation Musculoskeletal:        General: Normal range of motion.     Cervical back: Normal range of motion and neck supple.  Skin:    General: Skin is warm and dry.  Neurological:     Mental Status: She  is alert and oriented to person, place, and time.  Psychiatric:        Mood and Affect: Mood normal.        Behavior: Behavior normal.        Thought Content: Thought content normal.        Judgment: Judgment normal.     Diagnostics: FVC 1.62 which is 41% of predicted value, FEV1 1.43 which is 43% of predicted value.  Spirometry indicates possible restriction.  This is consistent with previous spirometry readings.  Assessment and Plan: 1. Not well controlled moderate persistent asthma   2. Non-seasonal allergic rhinitis due to other allergic trigger   3. Allergic conjunctivitis of  both eyes     Meds ordered this encounter  Medications   Olopatadine-Mometasone (RYALTRIS) 665-25 MCG/ACT SUSP    Sig: Place 2 sprays into the nose 2 (two) times daily as needed.    Patient Instructions  Asthma Increase Arnuity 100 (orange top) to1 puff once a day to prevent cough or wheeze Begin albuterol 2 puffs once every 4 hours as needed for cough or wheeze. Look on the inhaler or on the canister to see the name albuterol You may use albuterol 2 puffs 5 to 15 minutes before activity to decrease cough or wheeze  Allergic rhinitis Continue allergen avoidance measures directed toward dust mite and cockroach as listed below Continue cetirizine 1.25 mg twice a day as needed for runny nose or itch.  Begin Ryaltris 2 sprays in each nostril twice a day as needed for a stuffy nose.  In the right nostril, point the applicator out toward the right ear. In the left nostril, point the applicator out toward the left ear Consider saline nasal rinses as needed for nasal symptoms. Use this before any medicated nasal sprays for best result  Allergic conjunctivitis Some over the counter eye drops include Pataday one drop in each eye once a day as needed for red, itchy eyes OR Zaditor one drop in each eye twice a day as needed for red itchy eyes. Avoid eye drops that say red eye relief as they may contain medications that dry out your eyes.   Call the clinic if this treatment plan is not working well for you.  Follow up in 3 months or sooner if needed.   Return in about 3 months (around 11/14/2023), or if symptoms worsen or fail to improve.    Thank you for the opportunity to care for this patient.  Please do not hesitate to contact me with questions.  Thermon Leyland, FNP [facility]

## 2023-08-16 DIAGNOSIS — N2581 Secondary hyperparathyroidism of renal origin: Secondary | ICD-10-CM | POA: Diagnosis not present

## 2023-08-16 DIAGNOSIS — R52 Pain, unspecified: Secondary | ICD-10-CM | POA: Diagnosis not present

## 2023-08-16 DIAGNOSIS — R110 Nausea: Secondary | ICD-10-CM | POA: Diagnosis not present

## 2023-08-16 DIAGNOSIS — D631 Anemia in chronic kidney disease: Secondary | ICD-10-CM | POA: Diagnosis not present

## 2023-08-16 DIAGNOSIS — E1022 Type 1 diabetes mellitus with diabetic chronic kidney disease: Secondary | ICD-10-CM | POA: Diagnosis not present

## 2023-08-16 DIAGNOSIS — K658 Other peritonitis: Secondary | ICD-10-CM | POA: Diagnosis not present

## 2023-08-16 DIAGNOSIS — Z992 Dependence on renal dialysis: Secondary | ICD-10-CM | POA: Diagnosis not present

## 2023-08-16 DIAGNOSIS — N186 End stage renal disease: Secondary | ICD-10-CM | POA: Diagnosis not present

## 2023-08-17 ENCOUNTER — Inpatient Hospital Stay: Admitting: Internal Medicine

## 2023-08-18 DIAGNOSIS — R52 Pain, unspecified: Secondary | ICD-10-CM | POA: Diagnosis not present

## 2023-08-18 DIAGNOSIS — N2581 Secondary hyperparathyroidism of renal origin: Secondary | ICD-10-CM | POA: Diagnosis not present

## 2023-08-18 DIAGNOSIS — R110 Nausea: Secondary | ICD-10-CM | POA: Diagnosis not present

## 2023-08-18 DIAGNOSIS — K658 Other peritonitis: Secondary | ICD-10-CM | POA: Diagnosis not present

## 2023-08-18 DIAGNOSIS — N186 End stage renal disease: Secondary | ICD-10-CM | POA: Diagnosis not present

## 2023-08-18 DIAGNOSIS — Z992 Dependence on renal dialysis: Secondary | ICD-10-CM | POA: Diagnosis not present

## 2023-08-18 DIAGNOSIS — D631 Anemia in chronic kidney disease: Secondary | ICD-10-CM | POA: Diagnosis not present

## 2023-08-18 DIAGNOSIS — E1022 Type 1 diabetes mellitus with diabetic chronic kidney disease: Secondary | ICD-10-CM | POA: Diagnosis not present

## 2023-08-22 ENCOUNTER — Inpatient Hospital Stay: Admitting: Internal Medicine

## 2023-08-23 DIAGNOSIS — R52 Pain, unspecified: Secondary | ICD-10-CM | POA: Diagnosis not present

## 2023-08-23 DIAGNOSIS — K658 Other peritonitis: Secondary | ICD-10-CM | POA: Diagnosis not present

## 2023-08-23 DIAGNOSIS — N2581 Secondary hyperparathyroidism of renal origin: Secondary | ICD-10-CM | POA: Diagnosis not present

## 2023-08-23 DIAGNOSIS — Z992 Dependence on renal dialysis: Secondary | ICD-10-CM | POA: Diagnosis not present

## 2023-08-23 DIAGNOSIS — D631 Anemia in chronic kidney disease: Secondary | ICD-10-CM | POA: Diagnosis not present

## 2023-08-23 DIAGNOSIS — R110 Nausea: Secondary | ICD-10-CM | POA: Diagnosis not present

## 2023-08-23 DIAGNOSIS — E1022 Type 1 diabetes mellitus with diabetic chronic kidney disease: Secondary | ICD-10-CM | POA: Diagnosis not present

## 2023-08-23 DIAGNOSIS — N186 End stage renal disease: Secondary | ICD-10-CM | POA: Diagnosis not present

## 2023-08-24 ENCOUNTER — Encounter: Payer: Self-pay | Admitting: Gastroenterology

## 2023-08-25 DIAGNOSIS — N2581 Secondary hyperparathyroidism of renal origin: Secondary | ICD-10-CM | POA: Diagnosis not present

## 2023-08-25 DIAGNOSIS — R52 Pain, unspecified: Secondary | ICD-10-CM | POA: Diagnosis not present

## 2023-08-25 DIAGNOSIS — R110 Nausea: Secondary | ICD-10-CM | POA: Diagnosis not present

## 2023-08-25 DIAGNOSIS — Z992 Dependence on renal dialysis: Secondary | ICD-10-CM | POA: Diagnosis not present

## 2023-08-25 DIAGNOSIS — E1165 Type 2 diabetes mellitus with hyperglycemia: Secondary | ICD-10-CM | POA: Diagnosis not present

## 2023-08-25 DIAGNOSIS — K658 Other peritonitis: Secondary | ICD-10-CM | POA: Diagnosis not present

## 2023-08-25 DIAGNOSIS — N186 End stage renal disease: Secondary | ICD-10-CM | POA: Diagnosis not present

## 2023-08-25 DIAGNOSIS — E1022 Type 1 diabetes mellitus with diabetic chronic kidney disease: Secondary | ICD-10-CM | POA: Diagnosis not present

## 2023-08-25 DIAGNOSIS — D631 Anemia in chronic kidney disease: Secondary | ICD-10-CM | POA: Diagnosis not present

## 2023-08-27 DIAGNOSIS — N2581 Secondary hyperparathyroidism of renal origin: Secondary | ICD-10-CM | POA: Diagnosis not present

## 2023-08-27 DIAGNOSIS — N186 End stage renal disease: Secondary | ICD-10-CM | POA: Diagnosis not present

## 2023-08-27 DIAGNOSIS — Z992 Dependence on renal dialysis: Secondary | ICD-10-CM | POA: Diagnosis not present

## 2023-08-27 DIAGNOSIS — R52 Pain, unspecified: Secondary | ICD-10-CM | POA: Diagnosis not present

## 2023-08-27 DIAGNOSIS — D631 Anemia in chronic kidney disease: Secondary | ICD-10-CM | POA: Diagnosis not present

## 2023-08-27 DIAGNOSIS — E1022 Type 1 diabetes mellitus with diabetic chronic kidney disease: Secondary | ICD-10-CM | POA: Diagnosis not present

## 2023-08-27 DIAGNOSIS — K658 Other peritonitis: Secondary | ICD-10-CM | POA: Diagnosis not present

## 2023-08-27 DIAGNOSIS — R110 Nausea: Secondary | ICD-10-CM | POA: Diagnosis not present

## 2023-08-29 DIAGNOSIS — N186 End stage renal disease: Secondary | ICD-10-CM | POA: Diagnosis not present

## 2023-08-29 DIAGNOSIS — Z992 Dependence on renal dialysis: Secondary | ICD-10-CM | POA: Diagnosis not present

## 2023-08-31 DIAGNOSIS — N186 End stage renal disease: Secondary | ICD-10-CM | POA: Diagnosis not present

## 2023-08-31 DIAGNOSIS — Z992 Dependence on renal dialysis: Secondary | ICD-10-CM | POA: Diagnosis not present

## 2023-09-03 DIAGNOSIS — Z992 Dependence on renal dialysis: Secondary | ICD-10-CM | POA: Diagnosis not present

## 2023-09-03 DIAGNOSIS — R52 Pain, unspecified: Secondary | ICD-10-CM | POA: Diagnosis not present

## 2023-09-03 DIAGNOSIS — R110 Nausea: Secondary | ICD-10-CM | POA: Diagnosis not present

## 2023-09-03 DIAGNOSIS — K658 Other peritonitis: Secondary | ICD-10-CM | POA: Diagnosis not present

## 2023-09-03 DIAGNOSIS — N186 End stage renal disease: Secondary | ICD-10-CM | POA: Diagnosis not present

## 2023-09-03 DIAGNOSIS — E1022 Type 1 diabetes mellitus with diabetic chronic kidney disease: Secondary | ICD-10-CM | POA: Diagnosis not present

## 2023-09-03 DIAGNOSIS — N2581 Secondary hyperparathyroidism of renal origin: Secondary | ICD-10-CM | POA: Diagnosis not present

## 2023-09-03 DIAGNOSIS — D631 Anemia in chronic kidney disease: Secondary | ICD-10-CM | POA: Diagnosis not present

## 2023-09-06 ENCOUNTER — Encounter: Payer: Self-pay | Admitting: *Deleted

## 2023-09-06 DIAGNOSIS — N2581 Secondary hyperparathyroidism of renal origin: Secondary | ICD-10-CM | POA: Diagnosis not present

## 2023-09-06 DIAGNOSIS — R52 Pain, unspecified: Secondary | ICD-10-CM | POA: Diagnosis not present

## 2023-09-06 DIAGNOSIS — R110 Nausea: Secondary | ICD-10-CM | POA: Diagnosis not present

## 2023-09-06 DIAGNOSIS — E1022 Type 1 diabetes mellitus with diabetic chronic kidney disease: Secondary | ICD-10-CM | POA: Diagnosis not present

## 2023-09-06 DIAGNOSIS — N186 End stage renal disease: Secondary | ICD-10-CM | POA: Diagnosis not present

## 2023-09-06 DIAGNOSIS — D631 Anemia in chronic kidney disease: Secondary | ICD-10-CM | POA: Diagnosis not present

## 2023-09-06 DIAGNOSIS — Z992 Dependence on renal dialysis: Secondary | ICD-10-CM | POA: Diagnosis not present

## 2023-09-06 DIAGNOSIS — K658 Other peritonitis: Secondary | ICD-10-CM | POA: Diagnosis not present

## 2023-09-07 DIAGNOSIS — N186 End stage renal disease: Secondary | ICD-10-CM | POA: Diagnosis not present

## 2023-09-07 DIAGNOSIS — N032 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: Secondary | ICD-10-CM | POA: Diagnosis not present

## 2023-09-07 DIAGNOSIS — Z992 Dependence on renal dialysis: Secondary | ICD-10-CM | POA: Diagnosis not present

## 2023-09-08 ENCOUNTER — Ambulatory Visit
Admission: EM | Admit: 2023-09-08 | Discharge: 2023-09-08 | Disposition: A | Discharge status: Home / Self Care | Attending: Nurse Practitioner | Admitting: Nurse Practitioner

## 2023-09-08 ENCOUNTER — Encounter: Payer: Self-pay | Admitting: Emergency Medicine

## 2023-09-08 DIAGNOSIS — L03213 Periorbital cellulitis: Secondary | ICD-10-CM

## 2023-09-08 MED ORDER — DOXYCYCLINE HYCLATE 100 MG PO TABS: 100.0000 mg | ORAL_TABLET | Freq: Two times a day (BID) | ORAL | 0 refills | Status: AC

## 2023-09-08 NOTE — Discharge Instructions (Addendum)
 Take medication as prescribed.  Please make sure you take the entire course of the medication prescribed. Cool compresses to the left eye to help with pain and swelling. May take over-the-counter Tylenol  as needed for pain, fever, or general discomfort. Continue to monitor the area for worsening.  Go to the emergency department if you develop new symptoms such as fever, chills, chest pain, abdominal pain, nausea, vomiting, or diarrhea, with worsening eye symptoms.  Follow-up with your primary care physician or eye doctor as scheduled. Follow-up as needed.

## 2023-09-08 NOTE — ED Triage Notes (Signed)
 Left eye swollen and painful.  States she normally can't see out of that eye.   States she has a knot above left eye that has been there for 2 days.

## 2023-09-08 NOTE — ED Provider Notes (Signed)
 [facility]    CSN: [id number] Arrival date & time: 09/08/23  1216      History   Chief Complaint No chief complaint on file.   HPI Lindsey Murillo is a 28 y.o. female.   The history is provided by the patient and a parent (Mother).   Patient presents with her mother for complaints of pain and swelling around the left eye.  Patient states symptoms started approximately 2 days ago.  She endorses decreased vision in the eye, but states that is baseline for her.  She does have a history of left cataract surgery.  Patient states that she has not had any drainage, headache, or light sensitivity.  Patient denies use of glasses or contacts.  Patient also with a "bump" above the eye.  She states that the area around her eye is tenderness, also around the "bump" on her eye.  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 10/21/2021   Diabetic autonomic neuropathy ([name])    Diabetic peripheral neuropathy ([name])    Environmental allergies    Goiter    History of blood transfusion    Hypertension    no meds currently   Hypoglycemia associated with diabetes ([name])    Musculoskeletal pain 04/20/2022   Tachycardia    Thyroiditis, autoimmune    Type 1 diabetes mellitus in patient age 105-19 years with HbA1C goal below 7.5     Patient Active Problem List   Diagnosis Date Noted   Not well controlled moderate persistent asthma 08/15/2023   Allergic rhinitis due to allergen 08/15/2023   Allergic conjunctivitis of both eyes 08/15/2023   Protein-calorie malnutrition, severe 07/19/2023   Colitis 07/17/2023   Dizziness 07/17/2023   MSSA bacteremia 07/08/2023   Peritonitis ([name]) 07/08/2023   Dialysis-associated peritonitis ([name]) 07/05/2023    Pressure injury of skin 07/05/2023   Septic shock ([name]) 07/04/2023   Insulin  long-term use ([name]) 12/23/2022   Amenorrhea, secondary 10/26/2022   Vitamin D  deficiency 08/26/2022   Hyperlipidemia 07/21/2022   Non-adherence to medical treatment 01/21/2022   ESRD (end stage renal disease) ([name]) 12/18/2021   Secondary hyperparathyroidism of renal origin ([name]) 10/21/2021   Physical deconditioning 10/07/2021   Anemia in ESRD (end-stage renal disease) ([name]) 09/25/2021   Hypokalemia 06/17/2021   Nephrotic syndrome 05/27/2021   Chronic pancreatitis ([name]) 08/22/2019   Pancreatic pseudocyst/cyst 08/22/2019   GERD (gastroesophageal reflux disease) 08/22/2019   Uncontrolled type 1 diabetes mellitus with hyperglycemia ([name]) 05/12/2015   Asthma     Past Surgical History:  Procedure Laterality Date   AV FISTULA PLACEMENT Right 12/08/2021   Procedure: ARTERIOVENOUS  FISTULA CREATION VERSUS GRAFT;  Surgeon: Mayo Speck, MD;  Location: [facility];  Service: Vascular;  Laterality: Right;   BASCILIC VEIN TRANSPOSITION Right 01/12/2022   Procedure: RIGHT ARM SECOND STAGE BASILIC VEIN TRANSPOSITION;  Surgeon: Mayo Speck, MD;  Location: [facility];  Service: Vascular;  Laterality: Right;   BIOPSY  08/27/2016   Procedure: BIOPSY;  Surgeon: Alyce Jubilee, MD;  Location: [facility];  Service: Endoscopy;;  duodenum; gastric   BIOPSY  06/27/2019   Procedure: BIOPSY;  Surgeon: Suzette Espy, MD;  Location: [facility];  Service: Endoscopy;;   BIOPSY  02/09/2022   Procedure: BIOPSY;  Surgeon: Vinetta Greening, DO;  Location: [facility];  Service: Endoscopy;;   BREAST CYST EXCISION Right 11/12/2021   Procedure: RIGHT BREAST ABSCESS INCISION AND DRAINAGE;  Surgeon: Enid Harry, MD;  Location: [facility] OR;  Service: General;  Laterality: Right;  LMA   CAPD REMOVAL N/A 07/12/2023   Procedure: REMOVAL, CATHETER, CAPD;  Surgeon: Kayla Part, MD;  Location: [facility] OR;  Service: Vascular;  Laterality: N/A;   COLONOSCOPY      COLONOSCOPY WITH PROPOFOL  N/A 02/09/2022   Procedure: COLONOSCOPY WITH PROPOFOL ;  Surgeon: Vinetta Greening, DO;  Location: [facility];  Service: Endoscopy;  Laterality: N/A;  7:30am, asa 3, dialysis pt   ESOPHAGOGASTRODUODENOSCOPY N/A 08/27/2016   Dr. Nolene Baumgarten: mild gastritis. Negative celiac. No obvious source for dyspepsia/diarrhea   ESOPHAGOGASTRODUODENOSCOPY (EGD) WITH PROPOFOL  N/A 06/27/2019   rourk: Focal abnormality of the gastric mucosa likely due to trauma (heaving).  Biopsy showed mild gastritis, negative for H. pylori.  Esophageal dilation for history of dysphagia but normal-appearing esophagus.   ESOPHAGOGASTRODUODENOSCOPY (EGD) WITH PROPOFOL  N/A 02/09/2022   Procedure: ESOPHAGOGASTRODUODENOSCOPY (EGD) WITH PROPOFOL ;  Surgeon: Vinetta Greening, DO;  Location: [facility];  Service: Endoscopy;  Laterality: N/A;   IR FLUORO GUIDE CV LINE RIGHT  10/13/2021   IR FLUORO GUIDE CV LINE RIGHT  10/19/2021   IR US  GUIDE VASC ACCESS RIGHT  10/13/2021   IRRIGATION AND DEBRIDEMENT ABSCESS N/A 10/04/2021   Procedure: IRRIGATION AND DEBRIDEMENT NECK ABSCESS;  Surgeon: Shela Derby, MD;  Location: [facility] OR;  Service: General;  Laterality: N/A;   TRANSESOPHAGEAL ECHOCARDIOGRAM (CATH LAB) N/A 07/08/2023   Procedure: TRANSESOPHAGEAL ECHOCARDIOGRAM;  Surgeon: Euell Herrlich, MD;  Location: [facility];  Service: Cardiovascular;  Laterality: N/A;    OB History     Gravida  0   Para      Term      Preterm      AB      Living  0      SAB      IAB      Ectopic      Multiple      Live Births               Home Medications    Prior to Admission medications   Medication Sig Start Date End Date Taking? Authorizing Provider  acetaminophen  (TYLENOL ) 325 MG tablet Take 325 mg by mouth every 6 (six) hours as needed for mild pain (pain score 1-3) or moderate pain (pain score 4-6). 12/24/21   [provider]  ARNUITY ELLIPTA  100 MCG/ACT AEPB Inhale 1 puff into the  lungs daily. 08/08/23   [provider]  B Complex-C-Folic Acid  (DIALYVITE  800) 0.8 MG TABS Take 1 tablet by mouth daily. 11/16/21   [provider]  calcitRIOL  (ROCALTROL ) 0.5 MCG capsule Take 1 mcg by mouth daily.    [provider]  Continuous Glucose Receiver (DEXCOM G7 RECEIVER) DEVI by Does not apply route.    [provider]  Continuous Glucose Sensor (DEXCOM G7 SENSOR) MISC by Does not apply route.    [provider]  Insulin  Aspart, w/Niacinamide, (FIASP ) 100 UNIT/ML SOLN Inject 5-8 Units as directed with breakfast, with lunch, and with evening meal. Sliding Scale    [provider]  lipase/protease/amylase (CREON ) 12000-38000 units CPEP capsule Take 2 capsules (24,000 Units total) by mouth 3 (three) times daily with meals. 07/20/23   Lorita Rosa, MD  loperamide  (IMODIUM ) 2 MG capsule Take  1 capsule (2 mg total) by mouth as needed for diarrhea or loose stools. 07/20/23   Lorita Rosa, MD  medroxyPROGESTERone  (PROVERA ) 10 MG tablet Take provera  10 mg for 10 days every 3 months 11/16/22   Javan Messing, NP  midodrine  (PROAMATINE ) 5 MG tablet Take 1 tablet (5 mg total) by mouth 3 (three) times daily with meals. 07/13/23   Vada Garibaldi, MD  Nutritional Supplements (,FEEDING SUPPLEMENT, PROSOURCE PLUS) liquid Take 30 mLs by mouth 2 (two) times daily between meals. 07/20/23   Lorita Rosa, MD  Olopatadine -Mometasone (RYALTRIS ) 665-25 MCG/ACT SUSP Place 2 sprays into the nose 2 (two) times daily as needed. 08/15/23   Ardie Kras, FNP  pantoprazole  (PROTONIX ) 40 MG tablet Take 1 tablet (40 mg total) by mouth 2 (two) times daily before a meal. 03/22/23 03/16/24  Eustacio Highman, NP  rosuvastatin  (CRESTOR ) 10 MG tablet Take 1 tablet (10 mg total) by mouth daily. 03/15/23   Cook, Jayce G, DO  sucroferric oxyhydroxide (VELPHORO ) 500 MG chewable tablet Chew 1,000 mg by mouth 3 (three) times daily with meals. 11/16/21   [provider]   TRESIBA  FLEXTOUCH 100 UNIT/ML FlexTouch Pen Inject 3 Units into the skin at bedtime. 07/20/23   Lorita Rosa, MD  VENTOLIN  HFA 108 [phone] Base) MCG/ACT inhaler Inhale 2 puffs into the lungs every 4 (four) hours as needed for wheezing or shortness of breath. 2 puffs once every 4 hours as needed for cough, wheeze, shortness of breath and chest tightness. May use 2 puffs 5 to 15 minutes before activity to decrease cough or wheeze. 03/24/23   Brian Campanile, MD    Family History Family History  Problem Relation Age of Onset   Alcohol  abuse Paternal Grandfather    Huntington's disease Maternal Grandfather    Cancer Father        pancreatic   Diabetes Mother        Type II DM   Colon cancer Neg Hx    Colon polyps Neg Hx     Social History Social History   Tobacco Use   Smoking status: Never    Passive exposure: Never   Smokeless tobacco: Never  Vaping Use   Vaping status: Never Used  Substance Use Topics   Alcohol  use: No   Drug use: No     Allergies   Patient has no known allergies.   Review of Systems Review of Systems Per HPI  Physical Exam Triage Vital Signs ED Triage Vitals  Encounter Vitals Group     BP      Systolic BP Percentile      Diastolic BP Percentile      Pulse      Resp      Temp      Temp src      SpO2      Weight      Height      Head Circumference      Peak Flow      Pain Score      Pain Loc      Pain Education      Exclude from Growth Chart    No data found.  Updated Vital Signs There were no vitals taken for this visit.  Visual Acuity Right Eye Distance:   Left Eye Distance:   Bilateral Distance:    Right Eye Near:   Left Eye Near:    Bilateral Near:     Physical Exam Vitals and nursing note  reviewed.  Constitutional:      General: She is not in acute distress.    Appearance: Normal appearance.  HENT:     Head: Normocephalic.  Eyes:     General: No visual field deficit.       Left eye: No foreign body,  discharge or hordeolum.     Extraocular Movements: Extraocular movements intact.     Left eye: Normal extraocular motion and no nystagmus.     Conjunctiva/sclera:     Left eye: Left conjunctiva is injected. No chemosis, exudate or hemorrhage.    Pupils: Pupils are equal, round, and reactive to light.   Cardiovascular:     Rate and Rhythm: Normal rate and regular rhythm.     Pulses: Normal pulses.     Heart sounds: Normal heart sounds.  Pulmonary:     Effort: Pulmonary effort is normal.     Breath sounds: Normal breath sounds.  Musculoskeletal:     Cervical back: Normal range of motion.  Lymphadenopathy:     Cervical: No cervical adenopathy.  Skin:    General: Skin is warm and dry.  Neurological:     General: No focal deficit present.     Mental Status: She is alert and oriented to person, place, and time.  Psychiatric:        Mood and Affect: Mood normal.        Behavior: Behavior normal.      UC Treatments / Results  Labs (all labs ordered are listed, but only abnormal results are displayed) Labs Reviewed - No data to display  EKG   Radiology No results found.  Procedures Procedures (including critical care time)  Medications Ordered in UC Medications - No data to display  Initial Impression / Assessment and Plan / UC Course  I have reviewed the triage vital signs and the nursing notes.  Pertinent labs & imaging results that were available during my care of the patient were reviewed by me and considered in my medical decision making (see chart for details).  Will treat for possible cellulitis.  Patient with underlying history of renal failure, will treat with doxycycline  100 mg twice daily for the next 7 days.  Supportive care recommendations were provided and discussed with patient and her mother to include over-the-counter Tylenol , warm or cool compresses, and to monitor the area for worsening.  Mother and patient were given strict ER follow-up precautions.   Mother and patient were in agreement with this plan of care and verbalized understanding.  All questions were answered.  Patient stable for discharge.   Final Clinical Impressions(s) / UC Diagnoses   Final diagnoses:  None   Discharge Instructions   None    ED Prescriptions   None    PDMP not reviewed this encounter.   Hardy Lia, NP 09/08/23 1334

## 2023-09-10 DIAGNOSIS — E1022 Type 1 diabetes mellitus with diabetic chronic kidney disease: Secondary | ICD-10-CM | POA: Diagnosis not present

## 2023-09-10 DIAGNOSIS — E039 Hypothyroidism, unspecified: Secondary | ICD-10-CM | POA: Diagnosis not present

## 2023-09-10 DIAGNOSIS — R52 Pain, unspecified: Secondary | ICD-10-CM | POA: Diagnosis not present

## 2023-09-10 DIAGNOSIS — N186 End stage renal disease: Secondary | ICD-10-CM | POA: Diagnosis not present

## 2023-09-10 DIAGNOSIS — Z992 Dependence on renal dialysis: Secondary | ICD-10-CM | POA: Diagnosis not present

## 2023-09-10 DIAGNOSIS — N2581 Secondary hyperparathyroidism of renal origin: Secondary | ICD-10-CM | POA: Diagnosis not present

## 2023-09-10 DIAGNOSIS — D631 Anemia in chronic kidney disease: Secondary | ICD-10-CM | POA: Diagnosis not present

## 2023-09-12 ENCOUNTER — Ambulatory Visit (INDEPENDENT_AMBULATORY_CARE_PROVIDER_SITE_OTHER): Payer: 59 | Admitting: Family Medicine

## 2023-09-12 ENCOUNTER — Ambulatory Visit: Admitting: Podiatry

## 2023-09-12 VITALS — BP 132/100 | HR 102 | Temp 98.2°F | Ht 69.0 in | Wt 122.0 lb

## 2023-09-12 DIAGNOSIS — E1065 Type 1 diabetes mellitus with hyperglycemia: Secondary | ICD-10-CM | POA: Diagnosis not present

## 2023-09-12 DIAGNOSIS — I10 Essential (primary) hypertension: Secondary | ICD-10-CM | POA: Insufficient documentation

## 2023-09-12 DIAGNOSIS — F5101 Primary insomnia: Secondary | ICD-10-CM

## 2023-09-12 DIAGNOSIS — G4700 Insomnia, unspecified: Secondary | ICD-10-CM | POA: Insufficient documentation

## 2023-09-12 MED ORDER — ZOLPIDEM TARTRATE 5 MG PO TABS: 5.0000 mg | ORAL_TABLET | Freq: Every evening | ORAL | 3 refills | Status: AC | PRN

## 2023-09-12 NOTE — Assessment & Plan Note (Signed)
 Diastolic BP elevated here today.  Patient's blood pressure is labile.  Continue metoprolol .  Continue midodrine  as directed by nephrology.

## 2023-09-12 NOTE — Patient Instructions (Signed)
 Medication as prescribed.  Follow up in 6 months or sooner if needed.

## 2023-09-12 NOTE — Assessment & Plan Note (Signed)
 Chronic problem, uncontrolled.  Trial of Ambien.

## 2023-09-12 NOTE — Assessment & Plan Note (Signed)
 Glycemic control has improved.  Continue follow-up with endocrinology.

## 2023-09-12 NOTE — Progress Notes (Signed)
 Subjective:  Patient ID: Lindsey Murillo, female    DOB: 26-Oct-1995  Age: 28 y.o. MRN: [id number]  CC:   Chief Complaint  Patient presents with   6 month follow up    HPI:  28 year old female with an extensive past medical history including type 1 diabetes, chronic pancreatitis, nephrotic syndrome/end-stage renal disease on hemodialysis presents for follow-up.  Patient states that she has had vision loss of the left eye.  Follows with ophthalmology.  She is on dialysis, Tuesday, Thursday, and Saturday.  Reports a 1 year history of insomnia.  Reports difficulty initiating and maintaining sleep.  No medications or interventions tried.  She would like to discuss this today.  BP uncontrolled but is labile.  She is on midodrine  which she takes after hemodialysis.  Recently started on metoprolol  by nephrology.   Patient Active Problem List   Diagnosis Date Noted   Insomnia 09/12/2023   Hypertension 09/12/2023   Not well controlled moderate persistent asthma 08/15/2023   Allergic rhinitis due to allergen 08/15/2023   Allergic conjunctivitis of both eyes 08/15/2023   Protein-calorie malnutrition, severe 07/19/2023   Insulin  long-term use ([name]) 12/23/2022   Amenorrhea, secondary 10/26/2022   Vitamin D  deficiency 08/26/2022   Hyperlipidemia 07/21/2022   Non-adherence to medical treatment 01/21/2022   ESRD (end stage renal disease) ([name]) 12/18/2021   Secondary hyperparathyroidism of renal origin ([name]) 10/21/2021   Physical deconditioning 10/07/2021   Anemia in ESRD (end-stage renal disease) ([name]) 09/25/2021   Nephrotic syndrome 05/27/2021   Chronic pancreatitis ([name]) 08/22/2019   Pancreatic pseudocyst/cyst 08/22/2019   GERD (gastroesophageal reflux disease) 08/22/2019   Uncontrolled type 1 diabetes mellitus with hyperglycemia ([name]) 05/12/2015    Social Hx   Social History   Socioeconomic History   Marital status: Single    Spouse name: Not on file   Number of children:  0   Years of education: Not on file   Highest education level: High school graduate  Occupational History   Occupation: unemployed  Tobacco Use   Smoking status: Never    Passive exposure: Never   Smokeless tobacco: Never  Vaping Use   Vaping status: Never Used  Substance and Sexual Activity   Alcohol  use: No   Drug use: No   Sexual activity: Not Currently    Birth control/protection: Abstinence, None  Other Topics Concern   Not on file  Social History Narrative   Not on file   Social Drivers of Health   Financial Resource Strain: Low Risk  (04/28/2023)   Received from [facility]   Overall Financial Resource Strain (CARDIA)    Difficulty of Paying Living Expenses: Not hard at all  Food Insecurity: No Food Insecurity (07/21/2023)   Hunger Vital Sign    Worried About Running Out of Food in the Last Year: Never true    Ran Out of Food in the Last Year: Never true  Transportation Needs: No Transportation Needs (07/21/2023)   PRAPARE - Administrator, Civil Service (Medical): No    Lack of Transportation (Non-Medical): No  Physical Activity: Insufficiently Active (01/14/2023)   Exercise Vital Sign    Days of Exercise per Week: 4 days    Minutes of Exercise per Session: 30 min  Stress: No Stress Concern Present (05/24/2023)   Received from [facility] - Occupational Stress Questionnaire    Feeling of Stress : Only a little  Social Connections: Moderately Isolated (01/14/2023)   Social  Connection and Isolation Panel [NHANES]    Frequency of Communication with Friends and Family: More than three times a week    Frequency of Social Gatherings with Friends and Family: More than three times a week    Attends Religious Services: More than 4 times per year    Active Member of [employer] or Organizations: No    Attends Engineer, structural: Never    Marital Status: Never married    Review of Systems Per HPI  Objective:   BP (!) 132/100   Pulse (!) 102   Temp 98.2 F (36.8 C)   Ht 5\' 9"  (1.753 m)   Wt 122 lb (55.3 kg)   SpO2 97%   BMI 18.02 kg/m      09/12/2023    3:22 PM 09/12/2023    3:10 PM 09/12/2023    3:09 PM  BP/Weight  Systolic BP 132 167 170  Diastolic BP 100 92 102  Wt. (Lbs)   122  BMI   18.02 kg/m2    Physical Exam Vitals and nursing note reviewed.  Constitutional:      General: She is not in acute distress. HENT:     Head: Normocephalic and atraumatic.  Eyes:     Comments: Left eye - cloudy cornea.  Cardiovascular:     Rate and Rhythm: Normal rate and regular rhythm.  Pulmonary:     Effort: Pulmonary effort is normal.     Breath sounds: Normal breath sounds.  Neurological:     Mental Status: She is alert.     Lab Results  Component Value Date   WBC 11.9 (H) 07/19/2023   HGB 7.8 (L) 07/19/2023   HCT 24.6 (L) 07/19/2023   PLT 237 07/19/2023   GLUCOSE 157 (H) 07/19/2023   CHOL 190 07/20/2022   TRIG 500 (H) 07/20/2022   HDL 73 07/20/2022   LDLCALC 46 07/20/2022   ALT <5 07/18/2023   AST 19 07/18/2023   NA 136 07/19/2023   K 3.7 07/19/2023   CL 104 07/19/2023   CREATININE 4.10 (H) 07/19/2023   BUN 10 07/19/2023   CO2 26 07/19/2023   TSH 2.185 07/17/2023   INR 1.4 (H) 07/04/2023   HGBA1C 7.2 (A) 06/23/2023   MICROALBUR 1,195.2 (H) 06/13/2021     Assessment & Plan:  Primary insomnia Assessment & Plan: Chronic problem, uncontrolled.  Trial of Ambien.  Orders: -     Zolpidem Tartrate; Take 1 tablet (5 mg total) by mouth at bedtime as needed for sleep.  Dispense: 30 tablet; Refill: 3  Uncontrolled type 1 diabetes mellitus with hyperglycemia ([name]) Assessment & Plan: Glycemic control has improved.  Continue follow-up with endocrinology.   Hypertension, unspecified type Assessment & Plan: Diastolic BP elevated here today.  Patient's blood pressure is labile.  Continue metoprolol .  Continue midodrine  as directed by nephrology.     Follow-up:  6  months  Alayah Knouff Debrah Fan DO [facility]

## 2023-09-13 DIAGNOSIS — E1022 Type 1 diabetes mellitus with diabetic chronic kidney disease: Secondary | ICD-10-CM | POA: Diagnosis not present

## 2023-09-13 DIAGNOSIS — Z992 Dependence on renal dialysis: Secondary | ICD-10-CM | POA: Diagnosis not present

## 2023-09-13 DIAGNOSIS — R52 Pain, unspecified: Secondary | ICD-10-CM | POA: Diagnosis not present

## 2023-09-13 DIAGNOSIS — N2581 Secondary hyperparathyroidism of renal origin: Secondary | ICD-10-CM | POA: Diagnosis not present

## 2023-09-13 DIAGNOSIS — E039 Hypothyroidism, unspecified: Secondary | ICD-10-CM | POA: Diagnosis not present

## 2023-09-13 DIAGNOSIS — N186 End stage renal disease: Secondary | ICD-10-CM | POA: Diagnosis not present

## 2023-09-13 DIAGNOSIS — D631 Anemia in chronic kidney disease: Secondary | ICD-10-CM | POA: Diagnosis not present

## 2023-09-15 DIAGNOSIS — D631 Anemia in chronic kidney disease: Secondary | ICD-10-CM | POA: Diagnosis not present

## 2023-09-15 DIAGNOSIS — E1022 Type 1 diabetes mellitus with diabetic chronic kidney disease: Secondary | ICD-10-CM | POA: Diagnosis not present

## 2023-09-15 DIAGNOSIS — R52 Pain, unspecified: Secondary | ICD-10-CM | POA: Diagnosis not present

## 2023-09-15 DIAGNOSIS — N2581 Secondary hyperparathyroidism of renal origin: Secondary | ICD-10-CM | POA: Diagnosis not present

## 2023-09-15 DIAGNOSIS — Z992 Dependence on renal dialysis: Secondary | ICD-10-CM | POA: Diagnosis not present

## 2023-09-15 DIAGNOSIS — E039 Hypothyroidism, unspecified: Secondary | ICD-10-CM | POA: Diagnosis not present

## 2023-09-15 DIAGNOSIS — N186 End stage renal disease: Secondary | ICD-10-CM | POA: Diagnosis not present

## 2023-09-17 DIAGNOSIS — N2581 Secondary hyperparathyroidism of renal origin: Secondary | ICD-10-CM | POA: Diagnosis not present

## 2023-09-17 DIAGNOSIS — Z992 Dependence on renal dialysis: Secondary | ICD-10-CM | POA: Diagnosis not present

## 2023-09-17 DIAGNOSIS — R52 Pain, unspecified: Secondary | ICD-10-CM | POA: Diagnosis not present

## 2023-09-17 DIAGNOSIS — N186 End stage renal disease: Secondary | ICD-10-CM | POA: Diagnosis not present

## 2023-09-17 DIAGNOSIS — E039 Hypothyroidism, unspecified: Secondary | ICD-10-CM | POA: Diagnosis not present

## 2023-09-17 DIAGNOSIS — E1022 Type 1 diabetes mellitus with diabetic chronic kidney disease: Secondary | ICD-10-CM | POA: Diagnosis not present

## 2023-09-17 DIAGNOSIS — D631 Anemia in chronic kidney disease: Secondary | ICD-10-CM | POA: Diagnosis not present

## 2023-09-19 DIAGNOSIS — Z992 Dependence on renal dialysis: Secondary | ICD-10-CM | POA: Diagnosis not present

## 2023-09-19 DIAGNOSIS — D631 Anemia in chronic kidney disease: Secondary | ICD-10-CM | POA: Diagnosis not present

## 2023-09-19 DIAGNOSIS — N2581 Secondary hyperparathyroidism of renal origin: Secondary | ICD-10-CM | POA: Diagnosis not present

## 2023-09-19 DIAGNOSIS — E039 Hypothyroidism, unspecified: Secondary | ICD-10-CM | POA: Diagnosis not present

## 2023-09-19 DIAGNOSIS — E1022 Type 1 diabetes mellitus with diabetic chronic kidney disease: Secondary | ICD-10-CM | POA: Diagnosis not present

## 2023-09-19 DIAGNOSIS — R52 Pain, unspecified: Secondary | ICD-10-CM | POA: Diagnosis not present

## 2023-09-19 DIAGNOSIS — N186 End stage renal disease: Secondary | ICD-10-CM | POA: Diagnosis not present

## 2023-09-20 ENCOUNTER — Ambulatory Visit: Payer: 59 | Admitting: Nurse Practitioner

## 2023-09-20 DIAGNOSIS — Z794 Long term (current) use of insulin: Secondary | ICD-10-CM

## 2023-09-20 DIAGNOSIS — E559 Vitamin D deficiency, unspecified: Secondary | ICD-10-CM

## 2023-09-20 DIAGNOSIS — E782 Mixed hyperlipidemia: Secondary | ICD-10-CM

## 2023-09-20 DIAGNOSIS — E1022 Type 1 diabetes mellitus with diabetic chronic kidney disease: Secondary | ICD-10-CM

## 2023-09-20 DIAGNOSIS — I10 Essential (primary) hypertension: Secondary | ICD-10-CM

## 2023-09-22 DIAGNOSIS — N186 End stage renal disease: Secondary | ICD-10-CM | POA: Diagnosis not present

## 2023-09-22 DIAGNOSIS — Z992 Dependence on renal dialysis: Secondary | ICD-10-CM | POA: Diagnosis not present

## 2023-09-22 DIAGNOSIS — E039 Hypothyroidism, unspecified: Secondary | ICD-10-CM | POA: Diagnosis not present

## 2023-09-22 DIAGNOSIS — N2581 Secondary hyperparathyroidism of renal origin: Secondary | ICD-10-CM | POA: Diagnosis not present

## 2023-09-22 DIAGNOSIS — R52 Pain, unspecified: Secondary | ICD-10-CM | POA: Diagnosis not present

## 2023-09-22 DIAGNOSIS — E1022 Type 1 diabetes mellitus with diabetic chronic kidney disease: Secondary | ICD-10-CM | POA: Diagnosis not present

## 2023-09-22 DIAGNOSIS — D631 Anemia in chronic kidney disease: Secondary | ICD-10-CM | POA: Diagnosis not present

## 2023-09-23 ENCOUNTER — Encounter (HOSPITAL_COMMUNITY): Payer: Self-pay | Admitting: Hematology

## 2023-09-24 DIAGNOSIS — D631 Anemia in chronic kidney disease: Secondary | ICD-10-CM | POA: Diagnosis not present

## 2023-09-24 DIAGNOSIS — E1165 Type 2 diabetes mellitus with hyperglycemia: Secondary | ICD-10-CM | POA: Diagnosis not present

## 2023-09-24 DIAGNOSIS — E039 Hypothyroidism, unspecified: Secondary | ICD-10-CM | POA: Diagnosis not present

## 2023-09-24 DIAGNOSIS — N186 End stage renal disease: Secondary | ICD-10-CM | POA: Diagnosis not present

## 2023-09-24 DIAGNOSIS — N2581 Secondary hyperparathyroidism of renal origin: Secondary | ICD-10-CM | POA: Diagnosis not present

## 2023-09-24 DIAGNOSIS — E1022 Type 1 diabetes mellitus with diabetic chronic kidney disease: Secondary | ICD-10-CM | POA: Diagnosis not present

## 2023-09-24 DIAGNOSIS — R52 Pain, unspecified: Secondary | ICD-10-CM | POA: Diagnosis not present

## 2023-09-24 DIAGNOSIS — Z992 Dependence on renal dialysis: Secondary | ICD-10-CM | POA: Diagnosis not present

## 2023-09-27 DIAGNOSIS — E1022 Type 1 diabetes mellitus with diabetic chronic kidney disease: Secondary | ICD-10-CM | POA: Diagnosis not present

## 2023-09-27 DIAGNOSIS — Z992 Dependence on renal dialysis: Secondary | ICD-10-CM | POA: Diagnosis not present

## 2023-09-27 DIAGNOSIS — R52 Pain, unspecified: Secondary | ICD-10-CM | POA: Diagnosis not present

## 2023-09-27 DIAGNOSIS — N186 End stage renal disease: Secondary | ICD-10-CM | POA: Diagnosis not present

## 2023-09-27 DIAGNOSIS — N2581 Secondary hyperparathyroidism of renal origin: Secondary | ICD-10-CM | POA: Diagnosis not present

## 2023-09-27 DIAGNOSIS — E039 Hypothyroidism, unspecified: Secondary | ICD-10-CM | POA: Diagnosis not present

## 2023-09-27 DIAGNOSIS — D631 Anemia in chronic kidney disease: Secondary | ICD-10-CM | POA: Diagnosis not present

## 2023-09-29 DIAGNOSIS — D631 Anemia in chronic kidney disease: Secondary | ICD-10-CM | POA: Diagnosis not present

## 2023-09-29 DIAGNOSIS — N186 End stage renal disease: Secondary | ICD-10-CM | POA: Diagnosis not present

## 2023-09-29 DIAGNOSIS — Z992 Dependence on renal dialysis: Secondary | ICD-10-CM | POA: Diagnosis not present

## 2023-09-29 DIAGNOSIS — E1022 Type 1 diabetes mellitus with diabetic chronic kidney disease: Secondary | ICD-10-CM | POA: Diagnosis not present

## 2023-09-29 DIAGNOSIS — N2581 Secondary hyperparathyroidism of renal origin: Secondary | ICD-10-CM | POA: Diagnosis not present

## 2023-09-29 DIAGNOSIS — E039 Hypothyroidism, unspecified: Secondary | ICD-10-CM | POA: Diagnosis not present

## 2023-09-29 DIAGNOSIS — R52 Pain, unspecified: Secondary | ICD-10-CM | POA: Diagnosis not present

## 2023-09-30 DIAGNOSIS — D631 Anemia in chronic kidney disease: Secondary | ICD-10-CM | POA: Diagnosis not present

## 2023-09-30 DIAGNOSIS — R52 Pain, unspecified: Secondary | ICD-10-CM | POA: Diagnosis not present

## 2023-09-30 DIAGNOSIS — N186 End stage renal disease: Secondary | ICD-10-CM | POA: Diagnosis not present

## 2023-09-30 DIAGNOSIS — E039 Hypothyroidism, unspecified: Secondary | ICD-10-CM | POA: Diagnosis not present

## 2023-09-30 DIAGNOSIS — N2581 Secondary hyperparathyroidism of renal origin: Secondary | ICD-10-CM | POA: Diagnosis not present

## 2023-09-30 DIAGNOSIS — E1022 Type 1 diabetes mellitus with diabetic chronic kidney disease: Secondary | ICD-10-CM | POA: Diagnosis not present

## 2023-09-30 DIAGNOSIS — Z992 Dependence on renal dialysis: Secondary | ICD-10-CM | POA: Diagnosis not present

## 2023-10-04 DIAGNOSIS — Z992 Dependence on renal dialysis: Secondary | ICD-10-CM | POA: Diagnosis not present

## 2023-10-04 DIAGNOSIS — N2581 Secondary hyperparathyroidism of renal origin: Secondary | ICD-10-CM | POA: Diagnosis not present

## 2023-10-04 DIAGNOSIS — R52 Pain, unspecified: Secondary | ICD-10-CM | POA: Diagnosis not present

## 2023-10-04 DIAGNOSIS — N186 End stage renal disease: Secondary | ICD-10-CM | POA: Diagnosis not present

## 2023-10-04 DIAGNOSIS — D631 Anemia in chronic kidney disease: Secondary | ICD-10-CM | POA: Diagnosis not present

## 2023-10-04 DIAGNOSIS — E1022 Type 1 diabetes mellitus with diabetic chronic kidney disease: Secondary | ICD-10-CM | POA: Diagnosis not present

## 2023-10-04 DIAGNOSIS — E039 Hypothyroidism, unspecified: Secondary | ICD-10-CM | POA: Diagnosis not present

## 2023-10-04 HISTORY — DX: Other disorders of phosphorus metabolism: E83.39

## 2023-10-05 ENCOUNTER — Encounter: Payer: Self-pay | Admitting: Nurse Practitioner

## 2023-10-05 ENCOUNTER — Ambulatory Visit (INDEPENDENT_AMBULATORY_CARE_PROVIDER_SITE_OTHER): Admitting: Nurse Practitioner

## 2023-10-05 VITALS — BP 128/78 | HR 73 | Ht 69.0 in | Wt 121.0 lb

## 2023-10-05 DIAGNOSIS — E1022 Type 1 diabetes mellitus with diabetic chronic kidney disease: Secondary | ICD-10-CM

## 2023-10-05 DIAGNOSIS — Z794 Long term (current) use of insulin: Secondary | ICD-10-CM | POA: Diagnosis not present

## 2023-10-05 DIAGNOSIS — Z992 Dependence on renal dialysis: Secondary | ICD-10-CM

## 2023-10-05 DIAGNOSIS — N186 End stage renal disease: Secondary | ICD-10-CM | POA: Diagnosis not present

## 2023-10-05 DIAGNOSIS — E559 Vitamin D deficiency, unspecified: Secondary | ICD-10-CM

## 2023-10-05 DIAGNOSIS — I10 Essential (primary) hypertension: Secondary | ICD-10-CM | POA: Diagnosis not present

## 2023-10-05 DIAGNOSIS — E782 Mixed hyperlipidemia: Secondary | ICD-10-CM

## 2023-10-05 LAB — POCT GLYCOSYLATED HEMOGLOBIN (HGB A1C): Hemoglobin A1C: 7.3 % — AB (ref 4.0–5.6)

## 2023-10-05 MED ORDER — OMNIPOD 5 DEXG7G6 INTRO GEN 5 KIT: PACK | 0 refills | Status: DC

## 2023-10-05 MED ORDER — INSULIN LISPRO (1 UNIT DIAL) 100 UNIT/ML (KWIKPEN): 7.0000 [IU] | PEN_INJECTOR | Freq: Three times a day (TID) | SUBCUTANEOUS | 3 refills | Status: DC

## 2023-10-05 MED ORDER — OMNIPOD 5 DEXG7G6 PODS GEN 5 MISC: 3 refills | Status: DC

## 2023-10-05 NOTE — Progress Notes (Signed)
 10/05/2023, 4:24 PM  Endocrinology follow-up note   Subjective:    Patient ID: Lindsey Murillo, female    DOB: 08-01-1995.  Lindsey Murillo is being seen in follow-up after she was seen in consultation for management of currently uncontrolled symptomatic diabetes requested by  Cook, Jayce G, DO.   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 10/21/2021   Diabetic autonomic neuropathy ([name])    Diabetic peripheral neuropathy ([name])    Environmental allergies    Goiter    History of blood transfusion    Hypertension    no meds currently   Hypoglycemia associated with diabetes ([name])    Musculoskeletal pain 04/20/2022   Tachycardia    Thyroiditis, autoimmune    Type 1 diabetes mellitus in patient age 16-19 years with HbA1C goal below 7.5     Past Surgical History:  Procedure Laterality Date   AV FISTULA PLACEMENT Right 12/08/2021   Procedure: ARTERIOVENOUS  FISTULA CREATION VERSUS GRAFT;  Surgeon: Mayo Speck, MD;  Location: [facility];  Service: Vascular;  Laterality: Right;   BASCILIC VEIN TRANSPOSITION Right 01/12/2022   Procedure: RIGHT ARM SECOND STAGE BASILIC VEIN TRANSPOSITION;  Surgeon: Mayo Speck, MD;  Location: [facility];  Service: Vascular;  Laterality: Right;   BIOPSY  08/27/2016   Procedure: BIOPSY;  Surgeon: Alyce Jubilee, MD;  Location: [facility];  Service: Endoscopy;;  duodenum; gastric   BIOPSY  06/27/2019   Procedure: BIOPSY;  Surgeon: Suzette Espy, MD;  Location: [facility];  Service: Endoscopy;;   BIOPSY  02/09/2022   Procedure: BIOPSY;  Surgeon: Vinetta Greening, DO;  Location: [facility];  Service: Endoscopy;;   BREAST CYST  EXCISION Right 11/12/2021   Procedure: RIGHT BREAST ABSCESS INCISION AND DRAINAGE;  Surgeon: Enid Harry, MD;  Location: [facility] OR;  Service: General;  Laterality: Right;  LMA   CAPD REMOVAL N/A 07/12/2023   Procedure: REMOVAL, CATHETER, CAPD;  Surgeon: Kayla Part, MD;  Location: [facility] OR;  Service: Vascular;  Laterality: N/A;   COLONOSCOPY     COLONOSCOPY WITH PROPOFOL  N/A 02/09/2022   Procedure: COLONOSCOPY WITH PROPOFOL ;  Surgeon: Vinetta Greening, DO;  [facility];  Service: Endoscopy;  Laterality: N/A;  7:30am, asa 3, dialysis pt   ESOPHAGOGASTRODUODENOSCOPY N/A 08/27/2016   Dr. Nolene Baumgarten: mild gastritis. Negative celiac. No obvious source for dyspepsia/diarrhea   ESOPHAGOGASTRODUODENOSCOPY (EGD) WITH PROPOFOL  N/A 06/27/2019   rourk: Focal abnormality of the gastric mucosa likely due to trauma (heaving).  Biopsy showed mild gastritis, negative for H. pylori.  Esophageal dilation for history of dysphagia but normal-appearing esophagus.   ESOPHAGOGASTRODUODENOSCOPY (EGD) WITH PROPOFOL  N/A 02/09/2022   Procedure: ESOPHAGOGASTRODUODENOSCOPY (EGD) WITH PROPOFOL ;  Surgeon: Vinetta Greening, DO;  [facility];  Service: Endoscopy;  Laterality: N/A;   IR FLUORO GUIDE CV LINE RIGHT  10/13/2021   IR FLUORO GUIDE CV LINE RIGHT  10/19/2021   IR US  GUIDE VASC ACCESS RIGHT  10/13/2021   IRRIGATION AND DEBRIDEMENT ABSCESS N/A 10/04/2021   Procedure: IRRIGATION AND DEBRIDEMENT NECK ABSCESS;  Surgeon: Shela Derby, MD;  Location: [facility] OR;  Service: General;  Laterality: N/A;   TRANSESOPHAGEAL ECHOCARDIOGRAM (CATH LAB) N/A 07/08/2023   Procedure: TRANSESOPHAGEAL ECHOCARDIOGRAM;  Surgeon: Euell Herrlich, MD;  Location: [facility] INVASIVE CV LAB;  Service: Cardiovascular;  Laterality: N/A;    Social History   Socioeconomic History   Marital status: Single    Spouse name: Not on file   Number of children: 0   Years of education: Not on file   Highest education level: High school graduate   Occupational History   Occupation: unemployed  Tobacco Use   Smoking status: Never    Passive exposure: Never   Smokeless tobacco: Never  Vaping Use   Vaping status: Never Used  Substance and Sexual Activity   Alcohol  use: No   Drug use: No   Sexual activity: Not Currently    Birth control/protection: Abstinence, None  Other Topics Concern   Not on file  Social History Narrative   Not on file   Social Drivers of Health   Financial Resource Strain: Low Risk  (04/28/2023)   Received from [facility]   Overall Financial Resource Strain (CARDIA)    Difficulty of Paying Living Expenses: Not hard at all  Food Insecurity: No Food Insecurity (07/21/2023)   Hunger Vital Sign    Worried About Running Out of Food in the Last Year: Never true    Ran Out of Food in the Last Year: Never true  Transportation Needs: No Transportation Needs (07/21/2023)   PRAPARE - Administrator, Civil Service (Medical): No    Lack of Transportation (Non-Medical): No  Physical Activity: Insufficiently Active (01/14/2023)   Exercise Vital Sign    Days of Exercise per Week: 4 days    Minutes of Exercise per Session: 30 min  Stress: No Stress Concern Present (05/24/2023)   Received from [facility] - Occupational Stress Questionnaire    Feeling of Stress : Only a little  Social Connections: Moderately Isolated (01/14/2023)   Social Connection and Isolation Panel [NHANES]    Frequency of Communication with Friends and Family: More than three times a week    Frequency of Social Gatherings with Friends and Family: More than three times a week    Attends Religious Services: More than 4 times per year    Active Member of [employer] or Organizations: No    Attends Engineer, structural: Never    Marital Status: Never married    Family History  Problem Relation Age of Onset   Alcohol  abuse Paternal Grandfather    Huntington's disease Maternal  Grandfather    Cancer Father        pancreatic   Diabetes Mother        Type II DM   Colon cancer Neg Hx    Colon polyps Neg Hx     Outpatient Encounter Medications as of 10/05/2023  Medication Sig   acetaminophen  (TYLENOL ) 325 MG tablet Take 325 mg by mouth every 6 (six) hours as needed for mild pain (pain score 1-3) or moderate pain (pain score 4-6).   ARNUITY ELLIPTA  100 MCG/ACT AEPB Inhale 1 puff into the lungs daily.   B Complex-C-Folic Acid  (DIALYVITE  800) 0.8 MG TABS Take  1 tablet by mouth daily.   calcitRIOL  (ROCALTROL ) 0.5 MCG capsule Take 1 mcg by mouth daily.   Continuous Glucose Receiver (DEXCOM G7 RECEIVER) DEVI by Does not apply route.   Continuous Glucose Sensor (DEXCOM G7 SENSOR) MISC by Does not apply route.   Insulin  Disposable Pump (OMNIPOD 5 DEXG7G6 INTRO GEN 5) KIT Change pod every 48-72 hours   Insulin  Disposable Pump (OMNIPOD 5 DEXG7G6 PODS GEN 5) MISC Change pod every 48-72 hours   insulin  lispro (HUMALOG  KWIKPEN) 100 UNIT/ML KwikPen Inject 7-10 Units into the skin 3 (three) times daily.   lipase/protease/amylase (CREON ) 12000-38000 units CPEP capsule Take 2 capsules (24,000 Units total) by mouth 3 (three) times daily with meals.   loperamide  (IMODIUM ) 2 MG capsule Take 1 capsule (2 mg total) by mouth as needed for diarrhea or loose stools.   metoprolol  succinate (TOPROL -XL) 50 MG 24 hr tablet Take 50 mg by mouth daily.   midodrine  (PROAMATINE ) 5 MG tablet Take 1 tablet (5 mg total) by mouth 3 (three) times daily with meals.   Nutritional Supplements (,FEEDING SUPPLEMENT, PROSOURCE PLUS) liquid Take 30 mLs by mouth 2 (two) times daily between meals.   Olopatadine -Mometasone (RYALTRIS ) 665-25 MCG/ACT SUSP Place 2 sprays into the nose 2 (two) times daily as needed.   pantoprazole  (PROTONIX ) 40 MG tablet Take 1 tablet (40 mg total) by mouth 2 (two) times daily before a meal.   rosuvastatin  (CRESTOR ) 10 MG tablet Take 1 tablet (10 mg total) by mouth daily.   sucroferric  oxyhydroxide (VELPHORO ) 500 MG chewable tablet Chew 1,000 mg by mouth 3 (three) times daily with meals.   TRESIBA  FLEXTOUCH 100 UNIT/ML FlexTouch Pen Inject 3 Units into the skin at bedtime.   VENTOLIN  HFA 108 (90 Base) MCG/ACT inhaler Inhale 2 puffs into the lungs every 4 (four) hours as needed for wheezing or shortness of breath. 2 puffs once every 4 hours as needed for cough, wheeze, shortness of breath and chest tightness. May use 2 puffs 5 to 15 minutes before activity to decrease cough or wheeze.   zolpidem  (AMBIEN ) 5 MG tablet Take 1 tablet (5 mg total) by mouth at bedtime as needed for sleep.   [DISCONTINUED] Insulin  Aspart, w/Niacinamide, (FIASP ) 100 UNIT/ML SOLN Inject 5-8 Units as directed with breakfast, with lunch, and with evening meal. Sliding Scale   No facility-administered encounter medications on file as of 10/05/2023.    ALLERGIES: No Known Allergies  VACCINATION STATUS: Immunization History  Administered Date(s) Administered   Hepb-cpg 10/30/2021, 11/25/2021, 12/23/2021, 02/24/2022   Influenza,inj,Quad PF,6+ Mos 03/13/2013, 02/25/2014, 04/09/2016, 01/26/2017, 01/18/2019, 06/15/2021, 02/08/2022   PNEUMOCOCCAL CONJUGATE-20 10/28/2021   Td 04/15/2017   Td (Adult),5 Lf Tetanus Toxid, Preservative Free 04/15/2017    Diabetes She presents for her follow-up diabetic visit. She has type 1 diabetes mellitus. Her disease course has been fluctuating. There are no hypoglycemic associated symptoms. Pertinent negatives for diabetes include no polydipsia and no polyuria. There are no hypoglycemic complications. Symptoms are improving. Diabetic complications include nephropathy and peripheral neuropathy. (Non adherence to medical treatment.) Risk factors for coronary artery disease include diabetes mellitus, dyslipidemia and sedentary lifestyle. Current diabetic treatment includes intensive insulin  program. She is compliant with treatment most of the time. Her weight is fluctuating  minimally. She is following a generally unhealthy diet. When asked about meal planning, she reported none. She never participates in exercise. Her overall blood glucose range is 140-180 mg/dl. (She presents today with her CGM showing slightly above target glycemic profile.  Her  POCT A1c today is 7.3%,increasing from last visit of 7.2%.  She notes she has not done well with her diet recently.  She has HD on TThSa and gets up around 10 on those days and on her off days, she gets up around 11-12 and then she tends to eat and forget her meal time insulin .  Analysis of her CGM shows TIR 24%, TAR 76%, TBR 0% with a GMI of 8.9%.) An ACE inhibitor/angiotensin II receptor blocker is being taken.  Hyperlipidemia This is a chronic problem. Exacerbating diseases include diabetes. Current antihyperlipidemic treatment includes statins. Risk factors for coronary artery disease include dyslipidemia, diabetes mellitus, hypertension and a sedentary lifestyle.  Hypertension This is a chronic problem. Risk factors for coronary artery disease include dyslipidemia and diabetes mellitus. Past treatments include ACE inhibitors.    Review of systems  Constitutional: + Minimally fluctuating body weight,  current Body mass index is 17.87 kg/m. , no fatigue, no subjective hyperthermia, no subjective hypothermia Eyes: + blurry vision, no xerophthalmia ENT: no sore throat, no nodules palpated in throat, no dysphagia/odynophagia, no hoarseness Cardiovascular: no chest pain, no shortness of breath, no palpitations, no leg swelling Respiratory: no cough, no shortness of breath Gastrointestinal: no nausea/vomiting/diarrhea Musculoskeletal: walks with cane Skin: no rashes, no hyperemia Neurological: no tremors, no numbness, no tingling, no dizziness Psychiatric: no depression, no anxiety   Objective:    BP 128/78 (BP Location: Left Arm, Patient Position: Sitting, Cuff Size: Large)   Pulse 73   Ht 5\' 9"  (1.753 m)   Wt 121 lb  (54.9 kg)   BMI 17.87 kg/m   Wt Readings from Last 3 Encounters:  10/05/23 121 lb (54.9 kg)  09/12/23 122 lb (55.3 kg)  08/15/23 125 lb 9.6 oz (57 kg)    BP Readings from Last 3 Encounters:  10/05/23 128/78  09/12/23 (!) 132/100  09/08/23 (!) 143/90     Physical Exam- Limited  Constitutional:  Body mass index is 17.87 kg/m. , not in acute distress, mildly anxious/distracted state of mind Eyes:  EOMI, no exophthalmos Musculoskeletal: walks with cane Skin:  no rashes, no hyperemia Neurological: no tremor with outstretched hands   Diabetic Foot Exam - Simple   No data filed      CMP ( most recent) CMP     Component Value Date/Time   NA 136 07/19/2023 0516   NA 132 (L) 09/24/2021 1453   K 3.7 07/19/2023 0516   CL 104 07/19/2023 0516   CO2 26 07/19/2023 0516   GLUCOSE 157 (H) 07/19/2023 0516   BUN 10 07/19/2023 0516   BUN 36 (H) 09/24/2021 1453   CREATININE 4.10 (H) 07/19/2023 0516   CREATININE 0.98 08/29/2019 1637   CALCIUM  7.9 (L) 07/19/2023 0516   PROT 4.8 (L) 07/18/2023 0535   PROT 5.5 (L) 09/24/2021 1453   ALBUMIN  <1.5 (L) 07/18/2023 0535   ALBUMIN  2.1 (L) 09/24/2021 1453   AST 19 07/18/2023 0535   ALT <5 07/18/2023 0535   ALKPHOS 89 07/18/2023 0535   BILITOT 0.4 07/18/2023 0535   BILITOT <0.2 09/24/2021 1453   GFRNONAA 14 (L) 07/19/2023 0516   GFRAA >60 10/27/2019 2021     Diabetic Labs (most recent): Lab Results  Component Value Date   HGBA1C 7.3 (A) 10/05/2023   HGBA1C 7.2 (A) 06/23/2023   HGBA1C 10.5 (H) 12/14/2022   MICROALBUR 1,195.2 (H) 06/13/2021   MICROALBUR 0.50 11/28/2012   MICROALBUR 0.50 07/20/2012     Lipid Panel ( most recent) Lipid Panel  Component Value Date/Time   CHOL 190 07/20/2022 1440   TRIG 500 (H) 07/20/2022 1440   HDL 73 07/20/2022 1440   CHOLHDL 2.6 07/20/2022 1440   CHOLHDL 2.0 11/05/2015 1252   VLDL 12 11/05/2015 1252   LDLCALC 46 07/20/2022 1440   LABVLDL 71 (H) 07/20/2022 1440      Lab Results   Component Value Date   TSH 2.185 07/17/2023   TSH 1.060 10/26/2022   TSH 0.975 10/03/2021   TSH 2.520 05/26/2021   TSH 1.802 05/14/2021   TSH 0.342 (L) 05/27/2018   TSH 0.55 11/05/2015   TSH 1.175 11/28/2012   TSH 0.706 07/20/2012   TSH 1.008 08/26/2011   FREET4 1.19 10/26/2022   FREET4 1.4 11/05/2015   FREET4 1.26 11/28/2012   FREET4 1.44 07/20/2012   FREET4 1.23 08/26/2011      Assessment & Plan:   1) 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  She presents today with her CGM showing slightly above target glycemic profile.  Her POCT A1c today is 7.3%,increasing from last visit of 7.2%.  She notes she has not done well with her diet recently.  She has HD on TThSa and gets up around 10 on those days and on her off days, she gets up around 11-12 and then she tends to eat and forget her meal time insulin .  Analysis of her CGM shows TIR 24%, TAR 76%, TBR 0% with a GMI of 8.9%.  - I had a long discussion with her about the progressive nature of diabetes and the pathology behind its complications. -her diabetes is complicated by peripheral neuropathy, ESRD, noncompliance/nonadherence, peripheral ? Lymphedema she remains at a high risk for more acute and chronic complications which include CAD, CVA, CKD, retinopathy, and neuropathy. These are all discussed in detail with her.  - Nutritional counseling repeated at each appointment due to patients tendency to fall back in to old habits.  - The patient admits there is a room for improvement in their diet and drink choices. -  Suggestion is made for the patient to avoid simple carbohydrates from their diet including Cakes, Sweet Desserts / Pastries, Ice Cream, Soda (diet and regular), Sweet Tea, Candies, Chips, Cookies, Sweet Pastries, Store Bought Juices, Alcohol  in Excess of 1-2 drinks a day, Artificial Sweeteners, Coffee Creamer, and  "Sugar-free" Products. This will help patient to have stable blood glucose profile and potentially avoid unintended weight gain.   - I encouraged the patient to switch to unprocessed or minimally processed complex starch and increased protein intake (animal or plant source), fruits, and vegetables.   - Patient is advised to stick to a routine mealtimes to eat 3 meals a day and avoid unnecessary snacks (to snack only to correct hypoglycemia).  - I have approached her with the following plan to manage  her diabetes and patient agrees:   -She is advised to continue Tresiba  15 units SQ nightly and Humalog  7-10 units TID with meals if glucose is above 90 and she is eating (Specific instructions on how to titrate insulin  dosage based on glucose readings given to patient in writing).   She is advised to monitor glucose 4 times daily (using her CGM) and call the clinic if she has readings less than 70 or above 200 for 3 tests in a row.    -We did talk about possibility of insulin  pump in the future. I  did go ahead and send in for the Omnipod 5 to [facility] to see if it is affordable for her.  - she is warned not to take insulin  without proper monitoring per orders. - She does not have a suitable , non insulin  option to treat her diabetes.  - Specific targets for  A1c;  LDL, HDL,  and Triglycerides were discussed with the patient.  2) Blood Pressure /Hypertension:  -Her blood pressure is controlled to target without the use of antihypertensives.  Changes will be deferred to nephrology.  3) Lipids/Hyperlipidemia:  Her recent lipid panel from 07/20/22 shows controlled LDL of 46 and significantly elevated triglycerides of 500.  she is advised to continue Lipitor 10 mg p.o. daily at bedtime.  Side effects and precautions discussed with her.  4)  Weight/Diet:  Body mass index is 17.87 kg/m.   She is not a candidate for weight loss.    5) Chronic Care/Health Maintenance: -she is not on ACEI/ARB and  is on Statin medications and  is encouraged to initiate and continue to follow up with Ophthalmology, Dentist,  Podiatrist at least yearly or according to recommendations, and advised to  stay away from smoking. I have recommended yearly flu vaccine and pneumonia vaccine at least every 5 years; moderate intensity exercise for up to 150 minutes weekly; and  sleep for 7- 9 hours a day.  - she is  advised to maintain close follow up with Cook, Jayce G, DO for primary care needs, as well as her other providers for optimal and coordinated care.     I spent  48  minutes in the care of the patient today including review of labs from CMP, Lipids, Thyroid  Function, Hematology (current and previous including abstractions from other facilities); face-to-face time discussing  her blood glucose readings/logs, discussing hypoglycemia and hyperglycemia episodes and symptoms, medications doses, her options of short and long term treatment based on the latest standards of care / guidelines;  discussion about incorporating lifestyle medicine;  and documenting the encounter. Risk reduction counseling performed per USPSTF guidelines to reduce obesity and cardiovascular risk factors.     Please refer to Patient Instructions for Blood Glucose Monitoring and Insulin /Medications Dosing Guide"  in media tab for additional information. Please  also refer to " Patient Self Inventory" in the Media  tab for reviewed elements of pertinent patient history.  [employer] participated in the discussions, expressed understanding, and voiced agreement with the above plans.  All questions were answered to her satisfaction. she is encouraged to contact clinic should she have any questions or concerns prior to her return visit.   Follow up plan: - Return in about 4 months (around 02/05/2024) for Diabetes F/U with A1c in office, No previsit labs, Bring meter and logs.   Hulon Magic, [facility] [address] [place], [place] [address] Phone: [phone] Fax: [phone]

## 2023-10-06 DIAGNOSIS — D631 Anemia in chronic kidney disease: Secondary | ICD-10-CM | POA: Diagnosis not present

## 2023-10-06 DIAGNOSIS — N2581 Secondary hyperparathyroidism of renal origin: Secondary | ICD-10-CM | POA: Diagnosis not present

## 2023-10-06 DIAGNOSIS — E1022 Type 1 diabetes mellitus with diabetic chronic kidney disease: Secondary | ICD-10-CM | POA: Diagnosis not present

## 2023-10-06 DIAGNOSIS — Z992 Dependence on renal dialysis: Secondary | ICD-10-CM | POA: Diagnosis not present

## 2023-10-06 DIAGNOSIS — E039 Hypothyroidism, unspecified: Secondary | ICD-10-CM | POA: Diagnosis not present

## 2023-10-06 DIAGNOSIS — R52 Pain, unspecified: Secondary | ICD-10-CM | POA: Diagnosis not present

## 2023-10-06 DIAGNOSIS — N186 End stage renal disease: Secondary | ICD-10-CM | POA: Diagnosis not present

## 2023-10-08 DIAGNOSIS — N2581 Secondary hyperparathyroidism of renal origin: Secondary | ICD-10-CM | POA: Diagnosis not present

## 2023-10-08 DIAGNOSIS — E1022 Type 1 diabetes mellitus with diabetic chronic kidney disease: Secondary | ICD-10-CM | POA: Diagnosis not present

## 2023-10-08 DIAGNOSIS — N032 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: Secondary | ICD-10-CM | POA: Diagnosis not present

## 2023-10-08 DIAGNOSIS — E039 Hypothyroidism, unspecified: Secondary | ICD-10-CM | POA: Diagnosis not present

## 2023-10-08 DIAGNOSIS — Z992 Dependence on renal dialysis: Secondary | ICD-10-CM | POA: Diagnosis not present

## 2023-10-08 DIAGNOSIS — R52 Pain, unspecified: Secondary | ICD-10-CM | POA: Diagnosis not present

## 2023-10-08 DIAGNOSIS — D631 Anemia in chronic kidney disease: Secondary | ICD-10-CM | POA: Diagnosis not present

## 2023-10-08 DIAGNOSIS — N186 End stage renal disease: Secondary | ICD-10-CM | POA: Diagnosis not present

## 2023-10-10 ENCOUNTER — Ambulatory Visit (INDEPENDENT_AMBULATORY_CARE_PROVIDER_SITE_OTHER): Admitting: Podiatry

## 2023-10-10 DIAGNOSIS — M79674 Pain in right toe(s): Secondary | ICD-10-CM

## 2023-10-10 DIAGNOSIS — E10621 Type 1 diabetes mellitus with foot ulcer: Secondary | ICD-10-CM | POA: Diagnosis not present

## 2023-10-10 DIAGNOSIS — M79675 Pain in left toe(s): Secondary | ICD-10-CM | POA: Diagnosis not present

## 2023-10-10 DIAGNOSIS — B351 Tinea unguium: Secondary | ICD-10-CM

## 2023-10-10 NOTE — Progress Notes (Signed)
 Subjective:  Patient ID: Lindsey Murillo, female    DOB: 1995-08-28,  MRN: [id number]  Lindsey Murillo presents to clinic today for:  Chief Complaint  Patient presents with   Foot Ulcer    Pt stated that she is doing well she stated that she has no concerns at this time    Patient notes nails are thick, discolored, elongated and painful in shoegear when trying to ambulate.  He is high risk for ulceration due to her preulcerative callus right submet 1.  She has not been able to get her custom orthotics from [facility].  She denies any drainage from the wound and states that she is still awaiting her insulin  pump.  She is on dialysis 3 times a week  PCP is [place], Jayce G, DO.  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 10/21/2021   Diabetic autonomic neuropathy ([name])    Diabetic peripheral neuropathy ([name])    Environmental allergies    Goiter    History of blood transfusion    Hypertension    no meds currently   Hypoglycemia associated with diabetes ([name])    Musculoskeletal pain 04/20/2022   Tachycardia    Thyroiditis, autoimmune    Type 1 diabetes mellitus in patient age 61-19 years with HbA1C goal below 7.5    Past Surgical History:  Procedure Laterality Date   AV FISTULA PLACEMENT Right 12/08/2021   Procedure: ARTERIOVENOUS  FISTULA CREATION VERSUS GRAFT;  Surgeon: Mayo Speck, MD;  Location: [facility];  Service: Vascular;  Laterality: Right;   BASCILIC VEIN TRANSPOSITION Right 01/12/2022   Procedure: RIGHT ARM SECOND STAGE BASILIC VEIN TRANSPOSITION;  Surgeon: Mayo Speck, MD;  Location: [facility];  Service: Vascular;  Laterality: Right;   BIOPSY  08/27/2016   Procedure: BIOPSY;  Surgeon: Alyce Jubilee, MD;  Location: [facility];  Service: Endoscopy;;  duodenum; gastric   BIOPSY  06/27/2019   Procedure: BIOPSY;  Surgeon: Suzette Espy, MD;  Location: [facility];  Service: Endoscopy;;   BIOPSY  02/09/2022   Procedure: BIOPSY;  Surgeon: Vinetta Greening, DO;  Location: [facility];  Service: Endoscopy;;   BREAST CYST EXCISION Right 11/12/2021   Procedure: RIGHT BREAST ABSCESS INCISION AND DRAINAGE;  Surgeon: Enid Harry, MD;  Location: [facility] OR;  Service: General;  Laterality: Right;  LMA   CAPD REMOVAL N/A 07/12/2023   Procedure: REMOVAL, CATHETER, CAPD;  Surgeon: Kayla Part, MD;  Location: [facility] OR;  Service: Vascular;  Laterality: N/A;   COLONOSCOPY     COLONOSCOPY WITH PROPOFOL  N/A 02/09/2022   Procedure: COLONOSCOPY WITH PROPOFOL ;  Surgeon: Vinetta Greening, DO;  Location: [facility];  Service: Endoscopy;  Laterality: N/A;  7:30am, asa 3, dialysis pt   ESOPHAGOGASTRODUODENOSCOPY N/A 08/27/2016   Dr. Nolene Baumgarten: mild gastritis. Negative celiac. No obvious source for dyspepsia/diarrhea   ESOPHAGOGASTRODUODENOSCOPY (EGD) WITH PROPOFOL  N/A 06/27/2019   rourk: Focal abnormality of the gastric mucosa likely due to trauma (heaving).  Biopsy showed mild gastritis, negative for H. pylori.  Esophageal dilation for history of dysphagia but normal-appearing esophagus.   ESOPHAGOGASTRODUODENOSCOPY (EGD) WITH PROPOFOL  N/A 02/09/2022   Procedure: ESOPHAGOGASTRODUODENOSCOPY (EGD) WITH PROPOFOL ;  Surgeon: Vinetta Greening, DO;  Location: [facility];  Service: Endoscopy;  Laterality: N/A;   IR FLUORO GUIDE CV LINE RIGHT  10/13/2021   IR FLUORO GUIDE CV LINE RIGHT  10/19/2021   IR US  GUIDE VASC ACCESS RIGHT  10/13/2021   IRRIGATION AND DEBRIDEMENT ABSCESS N/A 10/04/2021   Procedure: IRRIGATION AND DEBRIDEMENT NECK ABSCESS;  Surgeon: Shela Derby, MD;  Location: [facility] OR;  Service: General;  Laterality: N/A;   TRANSESOPHAGEAL ECHOCARDIOGRAM (CATH LAB) N/A 07/08/2023   Procedure: TRANSESOPHAGEAL ECHOCARDIOGRAM;  Surgeon:  Euell Herrlich, MD;  Location: [facility] INVASIVE CV LAB;  Service: Cardiovascular;  Laterality: N/A;   No Known Allergies  Review of Systems: Negative except as noted in the HPI.  Objective:  Lindsey Murillo is a pleasant 28 y.o. female in NAD. AAO x 3.  Vascular Examination: Capillary refill time is 3-5 seconds to toes bilateral. Palpable pedal pulses b/l LE. Digital hair sparse b/l.  Skin temperature gradient WNL b/l. No varicosities b/l. No cyanosis noted b/l.   Dermatological Examination: Pedal skin with normal turgor, texture and tone b/l.  No interdigital macerations b/l. Toenails x10 are 3mm thick, discolored, dystrophic with subungual debris. There is pain with compression of the nail plates.  They are elongated x10.  The site of previous ulceration right submetatarsal 1 is closed today with minimal hemorrhagic callus present.  No pain on palpation is noted.     Latest Ref Rng & Units 10/05/2023    3:47 PM 06/23/2023    3:48 PM 12/14/2022    3:46 PM  Hemoglobin A1C  Hemoglobin-A1c 4.0 - 5.6 % 7.3  7.2  10.5    Assessment/Plan: 1. Pain due to onychomycosis of toenails of both feet   2. Type 1 diabetes mellitus with foot ulcer (CODE) ([name])     The mycotic toenails were sharply debrided x10 with sterile nail nippers and a power debriding burr to decrease bulk/thickness and length.    Encourage patient that the right submet 1 ulcer site looks very good today.  Encouraged her to continue with proper blood sugar control and hopefully she will be able to get her insulin  pump soon.  As she states things have been pretty crazy for her and has not had time to get the custom orthotics.  I feel that long-term, she would benefit most from custom orthotics due to her history of recurring ulceration right submet 1.  I do not feel a simple diabetic shoe insert [phone]) would be sufficient enough to properly offload the ulcerated area.  Return in about 9 weeks (around 12/12/2023) for [facility].   Joe Murders, DPM, FACFAS [facility]     [address] [place], [place] [address]                Office [phone]  Fax [phone]

## 2023-10-11 DIAGNOSIS — D509 Iron deficiency anemia, unspecified: Secondary | ICD-10-CM | POA: Diagnosis not present

## 2023-10-11 DIAGNOSIS — D631 Anemia in chronic kidney disease: Secondary | ICD-10-CM | POA: Diagnosis not present

## 2023-10-11 DIAGNOSIS — Z992 Dependence on renal dialysis: Secondary | ICD-10-CM | POA: Diagnosis not present

## 2023-10-11 DIAGNOSIS — E1022 Type 1 diabetes mellitus with diabetic chronic kidney disease: Secondary | ICD-10-CM | POA: Diagnosis not present

## 2023-10-11 DIAGNOSIS — N2581 Secondary hyperparathyroidism of renal origin: Secondary | ICD-10-CM | POA: Diagnosis not present

## 2023-10-11 DIAGNOSIS — N186 End stage renal disease: Secondary | ICD-10-CM | POA: Diagnosis not present

## 2023-10-13 DIAGNOSIS — D509 Iron deficiency anemia, unspecified: Secondary | ICD-10-CM | POA: Diagnosis not present

## 2023-10-13 DIAGNOSIS — N2581 Secondary hyperparathyroidism of renal origin: Secondary | ICD-10-CM | POA: Diagnosis not present

## 2023-10-13 DIAGNOSIS — D631 Anemia in chronic kidney disease: Secondary | ICD-10-CM | POA: Diagnosis not present

## 2023-10-13 DIAGNOSIS — Z992 Dependence on renal dialysis: Secondary | ICD-10-CM | POA: Diagnosis not present

## 2023-10-13 DIAGNOSIS — N186 End stage renal disease: Secondary | ICD-10-CM | POA: Diagnosis not present

## 2023-10-13 DIAGNOSIS — E1022 Type 1 diabetes mellitus with diabetic chronic kidney disease: Secondary | ICD-10-CM | POA: Diagnosis not present

## 2023-10-15 DIAGNOSIS — Z992 Dependence on renal dialysis: Secondary | ICD-10-CM | POA: Diagnosis not present

## 2023-10-15 DIAGNOSIS — E1022 Type 1 diabetes mellitus with diabetic chronic kidney disease: Secondary | ICD-10-CM | POA: Diagnosis not present

## 2023-10-15 DIAGNOSIS — N2581 Secondary hyperparathyroidism of renal origin: Secondary | ICD-10-CM | POA: Diagnosis not present

## 2023-10-15 DIAGNOSIS — D509 Iron deficiency anemia, unspecified: Secondary | ICD-10-CM | POA: Diagnosis not present

## 2023-10-15 DIAGNOSIS — N186 End stage renal disease: Secondary | ICD-10-CM | POA: Diagnosis not present

## 2023-10-15 DIAGNOSIS — D631 Anemia in chronic kidney disease: Secondary | ICD-10-CM | POA: Diagnosis not present

## 2023-10-18 DIAGNOSIS — Z992 Dependence on renal dialysis: Secondary | ICD-10-CM | POA: Diagnosis not present

## 2023-10-18 DIAGNOSIS — E1022 Type 1 diabetes mellitus with diabetic chronic kidney disease: Secondary | ICD-10-CM | POA: Diagnosis not present

## 2023-10-18 DIAGNOSIS — N2581 Secondary hyperparathyroidism of renal origin: Secondary | ICD-10-CM | POA: Diagnosis not present

## 2023-10-18 DIAGNOSIS — D631 Anemia in chronic kidney disease: Secondary | ICD-10-CM | POA: Diagnosis not present

## 2023-10-18 DIAGNOSIS — D509 Iron deficiency anemia, unspecified: Secondary | ICD-10-CM | POA: Diagnosis not present

## 2023-10-18 DIAGNOSIS — N186 End stage renal disease: Secondary | ICD-10-CM | POA: Diagnosis not present

## 2023-10-20 DIAGNOSIS — N2581 Secondary hyperparathyroidism of renal origin: Secondary | ICD-10-CM | POA: Diagnosis not present

## 2023-10-20 DIAGNOSIS — D631 Anemia in chronic kidney disease: Secondary | ICD-10-CM | POA: Diagnosis not present

## 2023-10-20 DIAGNOSIS — Z992 Dependence on renal dialysis: Secondary | ICD-10-CM | POA: Diagnosis not present

## 2023-10-20 DIAGNOSIS — D509 Iron deficiency anemia, unspecified: Secondary | ICD-10-CM | POA: Diagnosis not present

## 2023-10-20 DIAGNOSIS — E1022 Type 1 diabetes mellitus with diabetic chronic kidney disease: Secondary | ICD-10-CM | POA: Diagnosis not present

## 2023-10-20 DIAGNOSIS — N186 End stage renal disease: Secondary | ICD-10-CM | POA: Diagnosis not present

## 2023-10-21 DIAGNOSIS — D509 Iron deficiency anemia, unspecified: Secondary | ICD-10-CM | POA: Diagnosis not present

## 2023-10-21 DIAGNOSIS — N186 End stage renal disease: Secondary | ICD-10-CM | POA: Diagnosis not present

## 2023-10-21 DIAGNOSIS — D631 Anemia in chronic kidney disease: Secondary | ICD-10-CM | POA: Diagnosis not present

## 2023-10-21 DIAGNOSIS — Z992 Dependence on renal dialysis: Secondary | ICD-10-CM | POA: Diagnosis not present

## 2023-10-21 DIAGNOSIS — N2581 Secondary hyperparathyroidism of renal origin: Secondary | ICD-10-CM | POA: Diagnosis not present

## 2023-10-21 DIAGNOSIS — E1022 Type 1 diabetes mellitus with diabetic chronic kidney disease: Secondary | ICD-10-CM | POA: Diagnosis not present

## 2023-10-25 DIAGNOSIS — N186 End stage renal disease: Secondary | ICD-10-CM | POA: Diagnosis not present

## 2023-10-25 DIAGNOSIS — E1022 Type 1 diabetes mellitus with diabetic chronic kidney disease: Secondary | ICD-10-CM | POA: Diagnosis not present

## 2023-10-25 DIAGNOSIS — Z992 Dependence on renal dialysis: Secondary | ICD-10-CM | POA: Diagnosis not present

## 2023-10-25 DIAGNOSIS — E1165 Type 2 diabetes mellitus with hyperglycemia: Secondary | ICD-10-CM | POA: Diagnosis not present

## 2023-10-25 DIAGNOSIS — D631 Anemia in chronic kidney disease: Secondary | ICD-10-CM | POA: Diagnosis not present

## 2023-10-25 DIAGNOSIS — D509 Iron deficiency anemia, unspecified: Secondary | ICD-10-CM | POA: Diagnosis not present

## 2023-10-25 DIAGNOSIS — N2581 Secondary hyperparathyroidism of renal origin: Secondary | ICD-10-CM | POA: Diagnosis not present

## 2023-10-26 ENCOUNTER — Encounter: Payer: Self-pay | Admitting: Nurse Practitioner

## 2023-10-27 DIAGNOSIS — E1022 Type 1 diabetes mellitus with diabetic chronic kidney disease: Secondary | ICD-10-CM | POA: Diagnosis not present

## 2023-10-27 DIAGNOSIS — Z992 Dependence on renal dialysis: Secondary | ICD-10-CM | POA: Diagnosis not present

## 2023-10-27 DIAGNOSIS — D509 Iron deficiency anemia, unspecified: Secondary | ICD-10-CM | POA: Diagnosis not present

## 2023-10-27 DIAGNOSIS — N186 End stage renal disease: Secondary | ICD-10-CM | POA: Diagnosis not present

## 2023-10-27 DIAGNOSIS — N2581 Secondary hyperparathyroidism of renal origin: Secondary | ICD-10-CM | POA: Diagnosis not present

## 2023-10-27 DIAGNOSIS — D631 Anemia in chronic kidney disease: Secondary | ICD-10-CM | POA: Diagnosis not present

## 2023-10-29 DIAGNOSIS — N2581 Secondary hyperparathyroidism of renal origin: Secondary | ICD-10-CM | POA: Diagnosis not present

## 2023-10-29 DIAGNOSIS — N186 End stage renal disease: Secondary | ICD-10-CM | POA: Diagnosis not present

## 2023-10-29 DIAGNOSIS — D631 Anemia in chronic kidney disease: Secondary | ICD-10-CM | POA: Diagnosis not present

## 2023-10-29 DIAGNOSIS — Z992 Dependence on renal dialysis: Secondary | ICD-10-CM | POA: Diagnosis not present

## 2023-10-29 DIAGNOSIS — E1022 Type 1 diabetes mellitus with diabetic chronic kidney disease: Secondary | ICD-10-CM | POA: Diagnosis not present

## 2023-10-29 DIAGNOSIS — D509 Iron deficiency anemia, unspecified: Secondary | ICD-10-CM | POA: Diagnosis not present

## 2023-11-01 DIAGNOSIS — D509 Iron deficiency anemia, unspecified: Secondary | ICD-10-CM | POA: Diagnosis not present

## 2023-11-01 DIAGNOSIS — D631 Anemia in chronic kidney disease: Secondary | ICD-10-CM | POA: Diagnosis not present

## 2023-11-01 DIAGNOSIS — N186 End stage renal disease: Secondary | ICD-10-CM | POA: Diagnosis not present

## 2023-11-01 DIAGNOSIS — N2581 Secondary hyperparathyroidism of renal origin: Secondary | ICD-10-CM | POA: Diagnosis not present

## 2023-11-01 DIAGNOSIS — E1022 Type 1 diabetes mellitus with diabetic chronic kidney disease: Secondary | ICD-10-CM | POA: Diagnosis not present

## 2023-11-01 DIAGNOSIS — Z992 Dependence on renal dialysis: Secondary | ICD-10-CM | POA: Diagnosis not present

## 2023-11-03 DIAGNOSIS — N186 End stage renal disease: Secondary | ICD-10-CM | POA: Diagnosis not present

## 2023-11-03 DIAGNOSIS — D509 Iron deficiency anemia, unspecified: Secondary | ICD-10-CM | POA: Diagnosis not present

## 2023-11-03 DIAGNOSIS — Z992 Dependence on renal dialysis: Secondary | ICD-10-CM | POA: Diagnosis not present

## 2023-11-03 DIAGNOSIS — D631 Anemia in chronic kidney disease: Secondary | ICD-10-CM | POA: Diagnosis not present

## 2023-11-03 DIAGNOSIS — N2581 Secondary hyperparathyroidism of renal origin: Secondary | ICD-10-CM | POA: Diagnosis not present

## 2023-11-03 DIAGNOSIS — E1022 Type 1 diabetes mellitus with diabetic chronic kidney disease: Secondary | ICD-10-CM | POA: Diagnosis not present

## 2023-11-05 DIAGNOSIS — Z992 Dependence on renal dialysis: Secondary | ICD-10-CM | POA: Diagnosis not present

## 2023-11-05 DIAGNOSIS — N2581 Secondary hyperparathyroidism of renal origin: Secondary | ICD-10-CM | POA: Diagnosis not present

## 2023-11-05 DIAGNOSIS — D509 Iron deficiency anemia, unspecified: Secondary | ICD-10-CM | POA: Diagnosis not present

## 2023-11-05 DIAGNOSIS — N186 End stage renal disease: Secondary | ICD-10-CM | POA: Diagnosis not present

## 2023-11-05 DIAGNOSIS — D631 Anemia in chronic kidney disease: Secondary | ICD-10-CM | POA: Diagnosis not present

## 2023-11-05 DIAGNOSIS — E1022 Type 1 diabetes mellitus with diabetic chronic kidney disease: Secondary | ICD-10-CM | POA: Diagnosis not present

## 2023-11-07 DIAGNOSIS — N186 End stage renal disease: Secondary | ICD-10-CM | POA: Diagnosis not present

## 2023-11-07 DIAGNOSIS — N032 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: Secondary | ICD-10-CM | POA: Diagnosis not present

## 2023-11-07 DIAGNOSIS — Z992 Dependence on renal dialysis: Secondary | ICD-10-CM | POA: Diagnosis not present

## 2023-11-08 DIAGNOSIS — N2581 Secondary hyperparathyroidism of renal origin: Secondary | ICD-10-CM | POA: Diagnosis not present

## 2023-11-08 DIAGNOSIS — N186 End stage renal disease: Secondary | ICD-10-CM | POA: Diagnosis not present

## 2023-11-08 DIAGNOSIS — R110 Nausea: Secondary | ICD-10-CM | POA: Diagnosis not present

## 2023-11-08 DIAGNOSIS — Z992 Dependence on renal dialysis: Secondary | ICD-10-CM | POA: Diagnosis not present

## 2023-11-08 DIAGNOSIS — E1022 Type 1 diabetes mellitus with diabetic chronic kidney disease: Secondary | ICD-10-CM | POA: Diagnosis not present

## 2023-11-09 DIAGNOSIS — E1022 Type 1 diabetes mellitus with diabetic chronic kidney disease: Secondary | ICD-10-CM | POA: Diagnosis not present

## 2023-11-09 DIAGNOSIS — N186 End stage renal disease: Secondary | ICD-10-CM | POA: Diagnosis not present

## 2023-11-09 DIAGNOSIS — Z992 Dependence on renal dialysis: Secondary | ICD-10-CM | POA: Diagnosis not present

## 2023-11-09 DIAGNOSIS — N2581 Secondary hyperparathyroidism of renal origin: Secondary | ICD-10-CM | POA: Diagnosis not present

## 2023-11-09 DIAGNOSIS — R110 Nausea: Secondary | ICD-10-CM | POA: Diagnosis not present

## 2023-11-11 DIAGNOSIS — E1022 Type 1 diabetes mellitus with diabetic chronic kidney disease: Secondary | ICD-10-CM | POA: Diagnosis not present

## 2023-11-11 DIAGNOSIS — N186 End stage renal disease: Secondary | ICD-10-CM | POA: Diagnosis not present

## 2023-11-11 DIAGNOSIS — R110 Nausea: Secondary | ICD-10-CM | POA: Diagnosis not present

## 2023-11-11 DIAGNOSIS — N2581 Secondary hyperparathyroidism of renal origin: Secondary | ICD-10-CM | POA: Diagnosis not present

## 2023-11-11 DIAGNOSIS — Z992 Dependence on renal dialysis: Secondary | ICD-10-CM | POA: Diagnosis not present

## 2023-11-14 ENCOUNTER — Ambulatory Visit: Admitting: Allergy & Immunology

## 2023-11-14 DIAGNOSIS — R110 Nausea: Secondary | ICD-10-CM | POA: Diagnosis not present

## 2023-11-14 DIAGNOSIS — Z992 Dependence on renal dialysis: Secondary | ICD-10-CM | POA: Diagnosis not present

## 2023-11-14 DIAGNOSIS — N2581 Secondary hyperparathyroidism of renal origin: Secondary | ICD-10-CM | POA: Diagnosis not present

## 2023-11-14 DIAGNOSIS — N186 End stage renal disease: Secondary | ICD-10-CM | POA: Diagnosis not present

## 2023-11-14 DIAGNOSIS — E1022 Type 1 diabetes mellitus with diabetic chronic kidney disease: Secondary | ICD-10-CM | POA: Diagnosis not present

## 2023-11-16 DIAGNOSIS — E1022 Type 1 diabetes mellitus with diabetic chronic kidney disease: Secondary | ICD-10-CM | POA: Diagnosis not present

## 2023-11-16 DIAGNOSIS — N2581 Secondary hyperparathyroidism of renal origin: Secondary | ICD-10-CM | POA: Diagnosis not present

## 2023-11-16 DIAGNOSIS — Z992 Dependence on renal dialysis: Secondary | ICD-10-CM | POA: Diagnosis not present

## 2023-11-16 DIAGNOSIS — N186 End stage renal disease: Secondary | ICD-10-CM | POA: Diagnosis not present

## 2023-11-16 DIAGNOSIS — R110 Nausea: Secondary | ICD-10-CM | POA: Diagnosis not present

## 2023-11-18 DIAGNOSIS — N186 End stage renal disease: Secondary | ICD-10-CM | POA: Diagnosis not present

## 2023-11-18 DIAGNOSIS — R110 Nausea: Secondary | ICD-10-CM | POA: Diagnosis not present

## 2023-11-18 DIAGNOSIS — N2581 Secondary hyperparathyroidism of renal origin: Secondary | ICD-10-CM | POA: Diagnosis not present

## 2023-11-18 DIAGNOSIS — E1022 Type 1 diabetes mellitus with diabetic chronic kidney disease: Secondary | ICD-10-CM | POA: Diagnosis not present

## 2023-11-18 DIAGNOSIS — Z992 Dependence on renal dialysis: Secondary | ICD-10-CM | POA: Diagnosis not present

## 2023-11-22 DIAGNOSIS — N186 End stage renal disease: Secondary | ICD-10-CM | POA: Diagnosis not present

## 2023-11-22 DIAGNOSIS — N2581 Secondary hyperparathyroidism of renal origin: Secondary | ICD-10-CM | POA: Diagnosis not present

## 2023-11-22 DIAGNOSIS — E1022 Type 1 diabetes mellitus with diabetic chronic kidney disease: Secondary | ICD-10-CM | POA: Diagnosis not present

## 2023-11-22 DIAGNOSIS — R110 Nausea: Secondary | ICD-10-CM | POA: Diagnosis not present

## 2023-11-22 DIAGNOSIS — Z992 Dependence on renal dialysis: Secondary | ICD-10-CM | POA: Diagnosis not present

## 2023-11-23 DIAGNOSIS — N2581 Secondary hyperparathyroidism of renal origin: Secondary | ICD-10-CM | POA: Diagnosis not present

## 2023-11-23 DIAGNOSIS — E1022 Type 1 diabetes mellitus with diabetic chronic kidney disease: Secondary | ICD-10-CM | POA: Diagnosis not present

## 2023-11-23 DIAGNOSIS — R110 Nausea: Secondary | ICD-10-CM | POA: Diagnosis not present

## 2023-11-23 DIAGNOSIS — N186 End stage renal disease: Secondary | ICD-10-CM | POA: Diagnosis not present

## 2023-11-23 DIAGNOSIS — Z992 Dependence on renal dialysis: Secondary | ICD-10-CM | POA: Diagnosis not present

## 2023-11-24 DIAGNOSIS — E1165 Type 2 diabetes mellitus with hyperglycemia: Secondary | ICD-10-CM | POA: Diagnosis not present

## 2023-11-25 DIAGNOSIS — E1022 Type 1 diabetes mellitus with diabetic chronic kidney disease: Secondary | ICD-10-CM | POA: Diagnosis not present

## 2023-11-25 DIAGNOSIS — R110 Nausea: Secondary | ICD-10-CM | POA: Diagnosis not present

## 2023-11-25 DIAGNOSIS — N186 End stage renal disease: Secondary | ICD-10-CM | POA: Diagnosis not present

## 2023-11-25 DIAGNOSIS — N2581 Secondary hyperparathyroidism of renal origin: Secondary | ICD-10-CM | POA: Diagnosis not present

## 2023-11-25 DIAGNOSIS — Z992 Dependence on renal dialysis: Secondary | ICD-10-CM | POA: Diagnosis not present

## 2023-11-28 DIAGNOSIS — N2581 Secondary hyperparathyroidism of renal origin: Secondary | ICD-10-CM | POA: Diagnosis not present

## 2023-11-28 DIAGNOSIS — E1022 Type 1 diabetes mellitus with diabetic chronic kidney disease: Secondary | ICD-10-CM | POA: Diagnosis not present

## 2023-11-28 DIAGNOSIS — Z992 Dependence on renal dialysis: Secondary | ICD-10-CM | POA: Diagnosis not present

## 2023-11-28 DIAGNOSIS — N186 End stage renal disease: Secondary | ICD-10-CM | POA: Diagnosis not present

## 2023-11-28 DIAGNOSIS — R110 Nausea: Secondary | ICD-10-CM | POA: Diagnosis not present

## 2023-11-30 DIAGNOSIS — N186 End stage renal disease: Secondary | ICD-10-CM | POA: Diagnosis not present

## 2023-11-30 DIAGNOSIS — N2581 Secondary hyperparathyroidism of renal origin: Secondary | ICD-10-CM | POA: Diagnosis not present

## 2023-11-30 DIAGNOSIS — E1022 Type 1 diabetes mellitus with diabetic chronic kidney disease: Secondary | ICD-10-CM | POA: Diagnosis not present

## 2023-11-30 DIAGNOSIS — Z992 Dependence on renal dialysis: Secondary | ICD-10-CM | POA: Diagnosis not present

## 2023-11-30 DIAGNOSIS — R110 Nausea: Secondary | ICD-10-CM | POA: Diagnosis not present

## 2023-12-02 DIAGNOSIS — N186 End stage renal disease: Secondary | ICD-10-CM | POA: Diagnosis not present

## 2023-12-02 DIAGNOSIS — Z992 Dependence on renal dialysis: Secondary | ICD-10-CM | POA: Diagnosis not present

## 2023-12-02 DIAGNOSIS — R110 Nausea: Secondary | ICD-10-CM | POA: Diagnosis not present

## 2023-12-02 DIAGNOSIS — N2581 Secondary hyperparathyroidism of renal origin: Secondary | ICD-10-CM | POA: Diagnosis not present

## 2023-12-02 DIAGNOSIS — E1022 Type 1 diabetes mellitus with diabetic chronic kidney disease: Secondary | ICD-10-CM | POA: Diagnosis not present

## 2023-12-05 DIAGNOSIS — N186 End stage renal disease: Secondary | ICD-10-CM | POA: Diagnosis not present

## 2023-12-05 DIAGNOSIS — N2581 Secondary hyperparathyroidism of renal origin: Secondary | ICD-10-CM | POA: Diagnosis not present

## 2023-12-05 DIAGNOSIS — Z992 Dependence on renal dialysis: Secondary | ICD-10-CM | POA: Diagnosis not present

## 2023-12-05 DIAGNOSIS — R110 Nausea: Secondary | ICD-10-CM | POA: Diagnosis not present

## 2023-12-05 DIAGNOSIS — E1022 Type 1 diabetes mellitus with diabetic chronic kidney disease: Secondary | ICD-10-CM | POA: Diagnosis not present

## 2023-12-07 DIAGNOSIS — R110 Nausea: Secondary | ICD-10-CM | POA: Diagnosis not present

## 2023-12-07 DIAGNOSIS — Z992 Dependence on renal dialysis: Secondary | ICD-10-CM | POA: Diagnosis not present

## 2023-12-07 DIAGNOSIS — N2581 Secondary hyperparathyroidism of renal origin: Secondary | ICD-10-CM | POA: Diagnosis not present

## 2023-12-07 DIAGNOSIS — E1022 Type 1 diabetes mellitus with diabetic chronic kidney disease: Secondary | ICD-10-CM | POA: Diagnosis not present

## 2023-12-07 DIAGNOSIS — N186 End stage renal disease: Secondary | ICD-10-CM | POA: Diagnosis not present

## 2023-12-08 ENCOUNTER — Telehealth: Payer: Self-pay

## 2023-12-08 DIAGNOSIS — N032 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: Secondary | ICD-10-CM | POA: Diagnosis not present

## 2023-12-08 DIAGNOSIS — N186 End stage renal disease: Secondary | ICD-10-CM | POA: Diagnosis not present

## 2023-12-08 DIAGNOSIS — Z992 Dependence on renal dialysis: Secondary | ICD-10-CM | POA: Diagnosis not present

## 2023-12-09 DIAGNOSIS — D631 Anemia in chronic kidney disease: Secondary | ICD-10-CM | POA: Diagnosis not present

## 2023-12-09 DIAGNOSIS — Z992 Dependence on renal dialysis: Secondary | ICD-10-CM | POA: Diagnosis not present

## 2023-12-09 DIAGNOSIS — R52 Pain, unspecified: Secondary | ICD-10-CM | POA: Diagnosis not present

## 2023-12-09 DIAGNOSIS — E1022 Type 1 diabetes mellitus with diabetic chronic kidney disease: Secondary | ICD-10-CM | POA: Diagnosis not present

## 2023-12-09 DIAGNOSIS — N2581 Secondary hyperparathyroidism of renal origin: Secondary | ICD-10-CM | POA: Diagnosis not present

## 2023-12-09 DIAGNOSIS — E039 Hypothyroidism, unspecified: Secondary | ICD-10-CM | POA: Diagnosis not present

## 2023-12-09 DIAGNOSIS — N186 End stage renal disease: Secondary | ICD-10-CM | POA: Diagnosis not present

## 2023-12-17 DIAGNOSIS — N186 End stage renal disease: Secondary | ICD-10-CM | POA: Diagnosis not present

## 2023-12-17 DIAGNOSIS — Z992 Dependence on renal dialysis: Secondary | ICD-10-CM | POA: Diagnosis not present

## 2023-12-17 DIAGNOSIS — D631 Anemia in chronic kidney disease: Secondary | ICD-10-CM | POA: Diagnosis not present

## 2023-12-17 DIAGNOSIS — N2581 Secondary hyperparathyroidism of renal origin: Secondary | ICD-10-CM | POA: Diagnosis not present

## 2023-12-17 DIAGNOSIS — R52 Pain, unspecified: Secondary | ICD-10-CM | POA: Diagnosis not present

## 2023-12-17 DIAGNOSIS — E1022 Type 1 diabetes mellitus with diabetic chronic kidney disease: Secondary | ICD-10-CM | POA: Diagnosis not present

## 2023-12-17 DIAGNOSIS — E039 Hypothyroidism, unspecified: Secondary | ICD-10-CM | POA: Diagnosis not present

## 2023-12-19 ENCOUNTER — Encounter: Admitting: Podiatry

## 2023-12-19 DIAGNOSIS — Z91199 Patient's noncompliance with other medical treatment and regimen due to unspecified reason: Secondary | ICD-10-CM

## 2023-12-19 DIAGNOSIS — E1022 Type 1 diabetes mellitus with diabetic chronic kidney disease: Secondary | ICD-10-CM | POA: Diagnosis not present

## 2023-12-19 NOTE — Progress Notes (Signed)
 Patient did not show for her scheduled appointment this afternoon

## 2023-12-22 ENCOUNTER — Encounter: Payer: Self-pay | Admitting: Allergy & Immunology

## 2023-12-22 ENCOUNTER — Other Ambulatory Visit: Payer: Self-pay

## 2023-12-22 ENCOUNTER — Ambulatory Visit (INDEPENDENT_AMBULATORY_CARE_PROVIDER_SITE_OTHER): Admitting: Allergy & Immunology

## 2023-12-22 ENCOUNTER — Other Ambulatory Visit: Payer: Self-pay | Admitting: *Deleted

## 2023-12-22 VITALS — BP 122/84 | HR 87 | Temp 98.4°F | Wt 125.0 lb

## 2023-12-22 DIAGNOSIS — J4540 Moderate persistent asthma, uncomplicated: Secondary | ICD-10-CM | POA: Diagnosis not present

## 2023-12-22 DIAGNOSIS — H1013 Acute atopic conjunctivitis, bilateral: Secondary | ICD-10-CM

## 2023-12-22 DIAGNOSIS — J3089 Other allergic rhinitis: Secondary | ICD-10-CM

## 2023-12-22 MED ORDER — FLUTICASONE FUROATE-VILANTEROL 200-25 MCG/ACT IN AEPB: 1.0000 | INHALATION_SPRAY | Freq: Every day | RESPIRATORY_TRACT | 5 refills | Status: AC

## 2023-12-22 NOTE — Progress Notes (Signed)
 FOLLOW UP  Date of Service/Encounter:  12/22/23   Assessment:   Moderate persistent asthma, uncomplicated  Perennial allergic rhinitis (dust mites, cockroach)  CKD - on dialysis and listed for a transplant  Plan/Recommendations:  1. Perennial allergic rhinitis (dust mites, cockroach) - Continue with cetirizine  10mg  once daily. - Cetirizine  is cleared by the kidneys, but since you are on dialysis we can just use the regular dose. - We will need to change that dosing when you get the kidney transplant.  - Continue with the Ryaltris  nasal spray one spray per nostril daily as needed.   2. Moderate persistent asthma, uncomplicated - Lung  testing looks decent today. - Stop the Arnuity and start Breo one puff once daily. - This contains a long acting albuterol  combined with an inhaled steroid to help keep your lungs under better control. - Daily controller medication(Lindsey): Breo 200/25mcg one puff once daily - Prior to physical activity: albuterol  2 puffs 10-15 minutes before physical activity. - Rescue medications: albuterol  4 puffs every 4-6 hours as needed - Asthma control goals:  * Full participation in all desired activities (may need albuterol  before activity) * Albuterol  use two time or less a week on average (not counting use with activity) * Cough interfering with sleep two time or less a month * Oral steroids no more than once a year * No hospitalizations  3. Return in about 2 months (around 02/21/2024). You can have the follow up appointment with Dr. Iva or a Nurse Practicioner (our Nurse Practitioners are excellent and always have Physician oversight!).   Subjective:   Lindsey Murillo is a 28 y.o. female presenting today for follow up of  Chief Complaint  Patient presents with   Asthma    ZENIAH Murillo has a history of the following: Patient Active Problem List   Diagnosis Date Noted   Other disorders of phosphorus metabolism 10/04/2023   Insomnia 09/12/2023   Hypertension 09/12/2023   Not well controlled moderate persistent asthma 08/15/2023   Allergic rhinitis due to allergen 08/15/2023   Allergic conjunctivitis of both eyes 08/15/2023   Protein-calorie malnutrition, severe 07/19/2023   Encounter for removal of sutures 07/13/2023   Insulin  long-term use ([name]) 12/23/2022   Amenorrhea, secondary 10/26/2022   Vitamin D  deficiency 08/26/2022   Hyperlipidemia 07/21/2022   Non-adherence to medical treatment 01/21/2022   ESRD (end stage renal disease) ([name]) 12/18/2021   Secondary hyperparathyroidism of renal origin ([name]) 10/21/2021   Physical deconditioning 10/07/2021   Anemia in ESRD  (end-stage renal disease) ([name]) 09/25/2021   Nephrotic syndrome 05/27/2021   Chronic pancreatitis ([name]) 08/22/2019   Pancreatic pseudocyst/cyst 08/22/2019   GERD (gastroesophageal reflux disease) 08/22/2019   Uncontrolled type 1 diabetes mellitus with hyperglycemia ([name]) 05/12/2015    History obtained from: chart review and patient and her mother.  Discussed the use of AI scribe software for clinical note transcription with the patient and/or guardian, who gave verbal consent to proceed.  Murillo is a 28 y.o. female presenting for a follow up visit.  She was last seen in April 2025 by Arlean Mutter one of our nurse practitioners.  At that time, her Arnuity was increased to 100 mcg 1 puff once a day.  She was started on albuterol  as needed.  For her allergic rhinitis, she was continued on cetirizine  1.25 mg twice daily as needed for runny nose.  She was started on Ryaltris  2 sprays per nostril twice daily.  For her allergic rhinitis, she was continued on her eyedrops.  Since the last visit, she has been about the same.  Asthma/Respiratory Symptom History: She reports significant shortness of breath impacting daily activities. She denies coughing. She uses Arnuity daily but feels it is insufficient.  She has used Ventolin  in the past but not recently.  She keeps describing a purple inhaler, although Advair does not sound familiar to her.  She has not been on anything else aside from Arnuity, at least where she tells me. No chest pain and she has not had a recent breathing test, which she dislikes.   Allergic Rhinitis Symptom History: For allergies, she was given a nasal spray and Zyrtec  but does not use the nasal spray regularly due to dislike. An allergy  test in March of last year was positive for dust mites and cockroach. No further testing has been done since. She reports a sinus infection a couple of days ago. She sometimes finds it difficult to swallow pills, depending on their size, and needs to  drink a lot of water with them.  She is on dialysis three times a week due to kidney disease, which began six to seven years ago. She is being evaluated for a kidney transplant. She mentions weight gain and increased use of her eyes, possibly related to her kidney issues.  Otherwise, there have been no changes to her past medical history, surgical history, family history, or social history.    Review of systems otherwise negative other than that mentioned in the HPI.    Objective:   Blood pressure 122/84, pulse 87, temperature 98.4 F (36.9 C), temperature source Temporal, weight 125 lb (56.7 kg), SpO2 97%. Body mass index is 18.46 kg/m.    Physical Exam Vitals reviewed.  Constitutional:      Appearance: She is underweight. She is not ill-appearing or toxic-appearing.     Comments: Using a cane.   HENT:     Head: Normocephalic and atraumatic.     Right Ear: Tympanic membrane, ear canal and external ear normal.     Left Ear: Tympanic membrane, ear canal and external ear normal.     Nose: No nasal deformity, septal deviation, mucosal edema or rhinorrhea.     Right Turbinates: Enlarged, swollen and pale.     Left Turbinates: Enlarged, swollen and pale.     Right Sinus: No maxillary sinus tenderness or frontal sinus tenderness.     Left Sinus: No maxillary sinus tenderness or frontal sinus tenderness.     Mouth/Throat:     Mouth: Mucous membranes are not pale and not dry.     Pharynx: Uvula midline.  Eyes:     General: Lids are normal. No allergic shiner.       Right eye: No discharge.        Left eye: No discharge.     Conjunctiva/sclera: Conjunctivae normal.     Right eye: Right conjunctiva is not injected. No chemosis.    Left eye: Left conjunctiva is not injected. No chemosis.    Pupils: Pupils are equal, round, and reactive to light.  Cardiovascular:     Rate and Rhythm: Normal rate and regular rhythm.     Heart sounds: Normal heart sounds.  Pulmonary:     Effort:  Pulmonary effort is normal. No tachypnea, accessory muscle usage or respiratory distress.     Breath sounds: Normal breath sounds. No wheezing, rhonchi or rales.     Comments: Moving air well in all lung fields. No increased work of breathing noted.  Chest:     Chest wall: No tenderness.  Lymphadenopathy:     Cervical: No cervical adenopathy.  Skin:    General: Skin is warm.     Capillary Refill: Capillary refill takes less than  2 seconds.     Coloration: Skin is not pale.     Findings: No abrasion, erythema, petechiae or rash. Rash is not papular, urticarial or vesicular.     Comments: No eczematous or urticarial lesions noted.   Neurological:     Mental Status: She is alert.  Psychiatric:        Behavior: Behavior is cooperative.      Diagnostic studies:    Spirometry: results abnormal (FEV1: 1.79/55%, FVC: 2.39/62%, FEV1/FVC: 75%).    Spirometry consistent with normal pattern.    Allergy  Studies: none        Marty Shaggy, MD  [facility] 

## 2023-12-22 NOTE — Patient Instructions (Addendum)
 1. Perennial allergic rhinitis (dust mites, cockroach) - Continue with cetirizine  10mg  once daily. - Cetirizine  is cleared by the kidneys, but since you are on dialysis we can just use the regular dose. - We will need to change that dosing when you get the kidney transplant.  - Continue with the Ryaltris  nasal spray one spray per nostril daily as needed.   2. Moderate persistent asthma, uncomplicated - Lung testing looks decent today. - Stop the Arnuity and start Breo one puff once daily. - This contains a long acting albuterol  combined with an inhaled steroid to help keep your lungs under better control. - Daily controller medication(s): Breo  200/25mcg one puff once daily - Prior to physical activity: albuterol  2 puffs 10-15 minutes before physical activity. - Rescue medications: albuterol  4 puffs every 4-6 hours as needed - Asthma control goals:  * Full participation in all desired activities (may need albuterol  before activity) * Albuterol  use two time or less a week on average (not counting use with activity) * Cough interfering with sleep two time or less a month * Oral steroids no more than once a year * No hospitalizations  3. Return in about 2 months (around 02/21/2024). You can have the follow up appointment with Dr. Iva or a Nurse Practicioner (our Nurse Practitioners are excellent and always have Physician oversight!).    Please inform us  of any Emergency Department visits, hospitalizations, or changes in symptoms. Call us  before going to the ED for breathing or allergy  symptoms since we might be able to fit you in for a sick visit. Feel free to contact us  anytime with any questions, problems, or concerns.  It was a pleasure to see you again today!  Websites that have reliable patient information: 1. American Academy of Asthma, Allergy , and Immunology: www.aaaai.org 2. Food Allergy  Research and Education (FARE): foodallergy.org 3. Mothers of Asthmatics: http://www.asthmacommunitynetwork.org 4. [employer] of Allergy , Asthma, and Immunology: www.acaai.org      "Like" us  on Facebook and Instagram for our latest updates!      A healthy democracy works best when [employer] participate! Make sure you are registered to vote! If you have moved or changed any of your contact information, you will need to get this updated before voting! Scan the QR codes below to learn more!

## 2023-12-24 DIAGNOSIS — E039 Hypothyroidism, unspecified: Secondary | ICD-10-CM | POA: Diagnosis not present

## 2023-12-24 DIAGNOSIS — E1022 Type 1 diabetes mellitus with diabetic chronic kidney disease: Secondary | ICD-10-CM | POA: Diagnosis not present

## 2023-12-24 DIAGNOSIS — N2581 Secondary hyperparathyroidism of renal origin: Secondary | ICD-10-CM | POA: Diagnosis not present

## 2023-12-24 DIAGNOSIS — D631 Anemia in chronic kidney disease: Secondary | ICD-10-CM | POA: Diagnosis not present

## 2023-12-24 DIAGNOSIS — N186 End stage renal disease: Secondary | ICD-10-CM | POA: Diagnosis not present

## 2023-12-24 DIAGNOSIS — R52 Pain, unspecified: Secondary | ICD-10-CM | POA: Diagnosis not present

## 2023-12-25 DIAGNOSIS — E1165 Type 2 diabetes mellitus with hyperglycemia: Secondary | ICD-10-CM | POA: Diagnosis not present

## 2023-12-28 ENCOUNTER — Telehealth: Payer: Self-pay | Admitting: Nurse Practitioner

## 2023-12-28 NOTE — Telephone Encounter (Signed)
 Patient has her pump training tomorrow but does not have the insulin  for it? She said its at 8:30. Please Advise.

## 2023-12-29 MED ORDER — INSULIN LISPRO 100 UNIT/ML IJ SOLN: INTRAMUSCULAR | 3 refills | Status: AC

## 2023-12-29 NOTE — Telephone Encounter (Signed)
 I just sent in some insulin  vials for her to [facility].

## 2023-12-30 ENCOUNTER — Encounter: Payer: Self-pay | Admitting: Radiology

## 2024-01-02 DIAGNOSIS — M62830 Muscle spasm of back: Secondary | ICD-10-CM | POA: Diagnosis not present

## 2024-01-04 DIAGNOSIS — R52 Pain, unspecified: Secondary | ICD-10-CM | POA: Diagnosis not present

## 2024-01-06 DIAGNOSIS — D631 Anemia in chronic kidney disease: Secondary | ICD-10-CM | POA: Diagnosis not present

## 2024-01-08 DIAGNOSIS — Z992 Dependence on renal dialysis: Secondary | ICD-10-CM | POA: Diagnosis not present

## 2024-01-08 DIAGNOSIS — N032 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: Secondary | ICD-10-CM | POA: Diagnosis not present

## 2024-01-08 DIAGNOSIS — N186 End stage renal disease: Secondary | ICD-10-CM | POA: Diagnosis not present

## 2024-01-09 DIAGNOSIS — E1022 Type 1 diabetes mellitus with diabetic chronic kidney disease: Secondary | ICD-10-CM | POA: Diagnosis not present

## 2024-01-09 DIAGNOSIS — N2581 Secondary hyperparathyroidism of renal origin: Secondary | ICD-10-CM | POA: Diagnosis not present

## 2024-01-09 DIAGNOSIS — N186 End stage renal disease: Secondary | ICD-10-CM | POA: Diagnosis not present

## 2024-01-09 DIAGNOSIS — D509 Iron deficiency anemia, unspecified: Secondary | ICD-10-CM | POA: Diagnosis not present

## 2024-01-09 DIAGNOSIS — D631 Anemia in chronic kidney disease: Secondary | ICD-10-CM | POA: Diagnosis not present

## 2024-01-09 DIAGNOSIS — Z23 Encounter for immunization: Secondary | ICD-10-CM | POA: Diagnosis not present

## 2024-01-09 DIAGNOSIS — Z992 Dependence on renal dialysis: Secondary | ICD-10-CM | POA: Diagnosis not present

## 2024-01-11 DIAGNOSIS — Z992 Dependence on renal dialysis: Secondary | ICD-10-CM | POA: Diagnosis not present

## 2024-01-11 DIAGNOSIS — N186 End stage renal disease: Secondary | ICD-10-CM | POA: Diagnosis not present

## 2024-01-11 DIAGNOSIS — D631 Anemia in chronic kidney disease: Secondary | ICD-10-CM | POA: Diagnosis not present

## 2024-01-11 DIAGNOSIS — N2581 Secondary hyperparathyroidism of renal origin: Secondary | ICD-10-CM | POA: Diagnosis not present

## 2024-01-11 DIAGNOSIS — D509 Iron deficiency anemia, unspecified: Secondary | ICD-10-CM | POA: Diagnosis not present

## 2024-01-11 DIAGNOSIS — E1022 Type 1 diabetes mellitus with diabetic chronic kidney disease: Secondary | ICD-10-CM | POA: Diagnosis not present

## 2024-01-11 DIAGNOSIS — Z23 Encounter for immunization: Secondary | ICD-10-CM | POA: Diagnosis not present

## 2024-01-13 DIAGNOSIS — N2581 Secondary hyperparathyroidism of renal origin: Secondary | ICD-10-CM | POA: Diagnosis not present

## 2024-01-13 DIAGNOSIS — E1022 Type 1 diabetes mellitus with diabetic chronic kidney disease: Secondary | ICD-10-CM | POA: Diagnosis not present

## 2024-01-13 DIAGNOSIS — D509 Iron deficiency anemia, unspecified: Secondary | ICD-10-CM | POA: Diagnosis not present

## 2024-01-13 DIAGNOSIS — Z23 Encounter for immunization: Secondary | ICD-10-CM | POA: Diagnosis not present

## 2024-01-13 DIAGNOSIS — Z992 Dependence on renal dialysis: Secondary | ICD-10-CM | POA: Diagnosis not present

## 2024-01-13 DIAGNOSIS — D631 Anemia in chronic kidney disease: Secondary | ICD-10-CM | POA: Diagnosis not present

## 2024-01-13 DIAGNOSIS — N186 End stage renal disease: Secondary | ICD-10-CM | POA: Diagnosis not present

## 2024-01-16 DIAGNOSIS — N186 End stage renal disease: Secondary | ICD-10-CM | POA: Diagnosis not present

## 2024-01-16 DIAGNOSIS — D509 Iron deficiency anemia, unspecified: Secondary | ICD-10-CM | POA: Diagnosis not present

## 2024-01-16 DIAGNOSIS — D631 Anemia in chronic kidney disease: Secondary | ICD-10-CM | POA: Diagnosis not present

## 2024-01-16 DIAGNOSIS — N2581 Secondary hyperparathyroidism of renal origin: Secondary | ICD-10-CM | POA: Diagnosis not present

## 2024-01-16 DIAGNOSIS — Z7682 Awaiting organ transplant status: Secondary | ICD-10-CM | POA: Diagnosis not present

## 2024-01-16 DIAGNOSIS — E1022 Type 1 diabetes mellitus with diabetic chronic kidney disease: Secondary | ICD-10-CM | POA: Diagnosis not present

## 2024-01-16 DIAGNOSIS — Z992 Dependence on renal dialysis: Secondary | ICD-10-CM | POA: Diagnosis not present

## 2024-01-16 DIAGNOSIS — Z23 Encounter for immunization: Secondary | ICD-10-CM | POA: Diagnosis not present

## 2024-01-18 DIAGNOSIS — Z23 Encounter for immunization: Secondary | ICD-10-CM | POA: Diagnosis not present

## 2024-01-18 DIAGNOSIS — D631 Anemia in chronic kidney disease: Secondary | ICD-10-CM | POA: Diagnosis not present

## 2024-01-18 DIAGNOSIS — N186 End stage renal disease: Secondary | ICD-10-CM | POA: Diagnosis not present

## 2024-01-18 DIAGNOSIS — N2581 Secondary hyperparathyroidism of renal origin: Secondary | ICD-10-CM | POA: Diagnosis not present

## 2024-01-18 DIAGNOSIS — Z992 Dependence on renal dialysis: Secondary | ICD-10-CM | POA: Diagnosis not present

## 2024-01-18 DIAGNOSIS — D509 Iron deficiency anemia, unspecified: Secondary | ICD-10-CM | POA: Diagnosis not present

## 2024-01-18 DIAGNOSIS — E1022 Type 1 diabetes mellitus with diabetic chronic kidney disease: Secondary | ICD-10-CM | POA: Diagnosis not present

## 2024-01-20 ENCOUNTER — Ambulatory Visit: Payer: 59

## 2024-01-20 DIAGNOSIS — N186 End stage renal disease: Secondary | ICD-10-CM | POA: Diagnosis not present

## 2024-01-20 DIAGNOSIS — D509 Iron deficiency anemia, unspecified: Secondary | ICD-10-CM | POA: Diagnosis not present

## 2024-01-20 DIAGNOSIS — E1022 Type 1 diabetes mellitus with diabetic chronic kidney disease: Secondary | ICD-10-CM | POA: Diagnosis not present

## 2024-01-20 DIAGNOSIS — N2581 Secondary hyperparathyroidism of renal origin: Secondary | ICD-10-CM | POA: Diagnosis not present

## 2024-01-20 DIAGNOSIS — D631 Anemia in chronic kidney disease: Secondary | ICD-10-CM | POA: Diagnosis not present

## 2024-01-20 DIAGNOSIS — Z992 Dependence on renal dialysis: Secondary | ICD-10-CM | POA: Diagnosis not present

## 2024-01-20 DIAGNOSIS — Z23 Encounter for immunization: Secondary | ICD-10-CM | POA: Diagnosis not present

## 2024-01-24 DIAGNOSIS — D509 Iron deficiency anemia, unspecified: Secondary | ICD-10-CM | POA: Diagnosis not present

## 2024-01-24 DIAGNOSIS — Z992 Dependence on renal dialysis: Secondary | ICD-10-CM | POA: Diagnosis not present

## 2024-01-24 DIAGNOSIS — D631 Anemia in chronic kidney disease: Secondary | ICD-10-CM | POA: Diagnosis not present

## 2024-01-24 DIAGNOSIS — Z23 Encounter for immunization: Secondary | ICD-10-CM | POA: Diagnosis not present

## 2024-01-24 DIAGNOSIS — N186 End stage renal disease: Secondary | ICD-10-CM | POA: Diagnosis not present

## 2024-01-24 DIAGNOSIS — E1022 Type 1 diabetes mellitus with diabetic chronic kidney disease: Secondary | ICD-10-CM | POA: Diagnosis not present

## 2024-01-24 DIAGNOSIS — N2581 Secondary hyperparathyroidism of renal origin: Secondary | ICD-10-CM | POA: Diagnosis not present

## 2024-01-25 DIAGNOSIS — Z992 Dependence on renal dialysis: Secondary | ICD-10-CM | POA: Diagnosis not present

## 2024-01-25 DIAGNOSIS — N186 End stage renal disease: Secondary | ICD-10-CM | POA: Diagnosis not present

## 2024-01-25 DIAGNOSIS — D509 Iron deficiency anemia, unspecified: Secondary | ICD-10-CM | POA: Diagnosis not present

## 2024-01-25 DIAGNOSIS — E1022 Type 1 diabetes mellitus with diabetic chronic kidney disease: Secondary | ICD-10-CM | POA: Diagnosis not present

## 2024-01-25 DIAGNOSIS — E1165 Type 2 diabetes mellitus with hyperglycemia: Secondary | ICD-10-CM | POA: Diagnosis not present

## 2024-01-25 DIAGNOSIS — Z23 Encounter for immunization: Secondary | ICD-10-CM | POA: Diagnosis not present

## 2024-01-25 DIAGNOSIS — N2581 Secondary hyperparathyroidism of renal origin: Secondary | ICD-10-CM | POA: Diagnosis not present

## 2024-01-25 DIAGNOSIS — D631 Anemia in chronic kidney disease: Secondary | ICD-10-CM | POA: Diagnosis not present

## 2024-01-26 ENCOUNTER — Encounter: Admitting: Orthopedic Surgery

## 2024-01-27 DIAGNOSIS — N2581 Secondary hyperparathyroidism of renal origin: Secondary | ICD-10-CM | POA: Diagnosis not present

## 2024-01-27 DIAGNOSIS — D509 Iron deficiency anemia, unspecified: Secondary | ICD-10-CM | POA: Diagnosis not present

## 2024-01-27 DIAGNOSIS — D631 Anemia in chronic kidney disease: Secondary | ICD-10-CM | POA: Diagnosis not present

## 2024-01-27 DIAGNOSIS — N186 End stage renal disease: Secondary | ICD-10-CM | POA: Diagnosis not present

## 2024-01-27 DIAGNOSIS — Z23 Encounter for immunization: Secondary | ICD-10-CM | POA: Diagnosis not present

## 2024-01-27 DIAGNOSIS — Z992 Dependence on renal dialysis: Secondary | ICD-10-CM | POA: Diagnosis not present

## 2024-01-27 DIAGNOSIS — E1022 Type 1 diabetes mellitus with diabetic chronic kidney disease: Secondary | ICD-10-CM | POA: Diagnosis not present

## 2024-01-30 DIAGNOSIS — Z23 Encounter for immunization: Secondary | ICD-10-CM | POA: Diagnosis not present

## 2024-01-30 DIAGNOSIS — D631 Anemia in chronic kidney disease: Secondary | ICD-10-CM | POA: Diagnosis not present

## 2024-01-30 DIAGNOSIS — Z992 Dependence on renal dialysis: Secondary | ICD-10-CM | POA: Diagnosis not present

## 2024-01-30 DIAGNOSIS — E1022 Type 1 diabetes mellitus with diabetic chronic kidney disease: Secondary | ICD-10-CM | POA: Diagnosis not present

## 2024-01-30 DIAGNOSIS — N2581 Secondary hyperparathyroidism of renal origin: Secondary | ICD-10-CM | POA: Diagnosis not present

## 2024-01-30 DIAGNOSIS — D509 Iron deficiency anemia, unspecified: Secondary | ICD-10-CM | POA: Diagnosis not present

## 2024-01-30 DIAGNOSIS — N186 End stage renal disease: Secondary | ICD-10-CM | POA: Diagnosis not present

## 2024-02-01 DIAGNOSIS — Z23 Encounter for immunization: Secondary | ICD-10-CM | POA: Diagnosis not present

## 2024-02-01 DIAGNOSIS — D509 Iron deficiency anemia, unspecified: Secondary | ICD-10-CM | POA: Diagnosis not present

## 2024-02-01 DIAGNOSIS — Z992 Dependence on renal dialysis: Secondary | ICD-10-CM | POA: Diagnosis not present

## 2024-02-01 DIAGNOSIS — N2581 Secondary hyperparathyroidism of renal origin: Secondary | ICD-10-CM | POA: Diagnosis not present

## 2024-02-01 DIAGNOSIS — D631 Anemia in chronic kidney disease: Secondary | ICD-10-CM | POA: Diagnosis not present

## 2024-02-01 DIAGNOSIS — E1022 Type 1 diabetes mellitus with diabetic chronic kidney disease: Secondary | ICD-10-CM | POA: Diagnosis not present

## 2024-02-01 DIAGNOSIS — N186 End stage renal disease: Secondary | ICD-10-CM | POA: Diagnosis not present

## 2024-02-03 DIAGNOSIS — D509 Iron deficiency anemia, unspecified: Secondary | ICD-10-CM | POA: Diagnosis not present

## 2024-02-03 DIAGNOSIS — D631 Anemia in chronic kidney disease: Secondary | ICD-10-CM | POA: Diagnosis not present

## 2024-02-03 DIAGNOSIS — N186 End stage renal disease: Secondary | ICD-10-CM | POA: Diagnosis not present

## 2024-02-03 DIAGNOSIS — Z23 Encounter for immunization: Secondary | ICD-10-CM | POA: Diagnosis not present

## 2024-02-03 DIAGNOSIS — N2581 Secondary hyperparathyroidism of renal origin: Secondary | ICD-10-CM | POA: Diagnosis not present

## 2024-02-03 DIAGNOSIS — Z992 Dependence on renal dialysis: Secondary | ICD-10-CM | POA: Diagnosis not present

## 2024-02-03 DIAGNOSIS — E1022 Type 1 diabetes mellitus with diabetic chronic kidney disease: Secondary | ICD-10-CM | POA: Diagnosis not present

## 2024-02-06 DIAGNOSIS — E1022 Type 1 diabetes mellitus with diabetic chronic kidney disease: Secondary | ICD-10-CM | POA: Diagnosis not present

## 2024-02-06 DIAGNOSIS — Z992 Dependence on renal dialysis: Secondary | ICD-10-CM | POA: Diagnosis not present

## 2024-02-06 DIAGNOSIS — N2581 Secondary hyperparathyroidism of renal origin: Secondary | ICD-10-CM | POA: Diagnosis not present

## 2024-02-06 DIAGNOSIS — D631 Anemia in chronic kidney disease: Secondary | ICD-10-CM | POA: Diagnosis not present

## 2024-02-06 DIAGNOSIS — Z23 Encounter for immunization: Secondary | ICD-10-CM | POA: Diagnosis not present

## 2024-02-06 DIAGNOSIS — N186 End stage renal disease: Secondary | ICD-10-CM | POA: Diagnosis not present

## 2024-02-06 DIAGNOSIS — D509 Iron deficiency anemia, unspecified: Secondary | ICD-10-CM | POA: Diagnosis not present

## 2024-02-07 ENCOUNTER — Encounter (INDEPENDENT_AMBULATORY_CARE_PROVIDER_SITE_OTHER): Payer: Self-pay | Admitting: Podiatry

## 2024-02-07 DIAGNOSIS — N186 End stage renal disease: Secondary | ICD-10-CM | POA: Diagnosis not present

## 2024-02-07 DIAGNOSIS — Z91199 Patient's noncompliance with other medical treatment and regimen due to unspecified reason: Secondary | ICD-10-CM

## 2024-02-07 DIAGNOSIS — Z992 Dependence on renal dialysis: Secondary | ICD-10-CM | POA: Diagnosis not present

## 2024-02-07 DIAGNOSIS — N032 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: Secondary | ICD-10-CM | POA: Diagnosis not present

## 2024-02-07 NOTE — Progress Notes (Signed)
 Patient did not show for her scheduled appointment this morning.

## 2024-02-08 ENCOUNTER — Encounter: Payer: Self-pay | Admitting: Nurse Practitioner

## 2024-02-08 ENCOUNTER — Ambulatory Visit (INDEPENDENT_AMBULATORY_CARE_PROVIDER_SITE_OTHER): Admitting: Nurse Practitioner

## 2024-02-08 VITALS — BP 92/60 | HR 101 | Ht 69.0 in | Wt 120.8 lb

## 2024-02-08 DIAGNOSIS — Z794 Long term (current) use of insulin: Secondary | ICD-10-CM

## 2024-02-08 DIAGNOSIS — I10 Essential (primary) hypertension: Secondary | ICD-10-CM

## 2024-02-08 DIAGNOSIS — E559 Vitamin D deficiency, unspecified: Secondary | ICD-10-CM | POA: Diagnosis not present

## 2024-02-08 DIAGNOSIS — E1022 Type 1 diabetes mellitus with diabetic chronic kidney disease: Secondary | ICD-10-CM

## 2024-02-08 DIAGNOSIS — Z992 Dependence on renal dialysis: Secondary | ICD-10-CM

## 2024-02-08 DIAGNOSIS — N186 End stage renal disease: Secondary | ICD-10-CM | POA: Diagnosis not present

## 2024-02-08 DIAGNOSIS — E782 Mixed hyperlipidemia: Secondary | ICD-10-CM | POA: Diagnosis not present

## 2024-02-08 LAB — POCT GLYCOSYLATED HEMOGLOBIN (HGB A1C): Hemoglobin A1C: 9 % — AB (ref 4.0–5.6)

## 2024-02-08 NOTE — Progress Notes (Signed)
 02/08/2024, 4:19 PM  Endocrinology follow-up note   Subjective:    Patient ID: Lindsey Murillo, female    DOB: Aug 28, 1995.  Cheryll R Pallo is being seen in follow-up after she was seen in consultation for management of currently uncontrolled symptomatic diabetes requested by  Cook, Jayce G, DO.   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 10/21/2021   Diabetic autonomic neuropathy ([name])    Diabetic peripheral neuropathy ([name])    Environmental allergies    Goiter    History of blood transfusion    Hypertension    no meds currently   Hypoglycemia associated with diabetes ([name])    Musculoskeletal pain 04/20/2022   Tachycardia    Thyroiditis, autoimmune    Type 1 diabetes mellitus in patient age 77-19 years with HbA1C goal below 7.5     Past Surgical History:  Procedure Laterality Date   AV FISTULA PLACEMENT Right 12/08/2021   Procedure: ARTERIOVENOUS  FISTULA CREATION VERSUS GRAFT;  Surgeon: Oris Krystal FALCON, MD;  Location: [facility] OR[facility];  Service: Vascular;  Laterality: Right;   BASCILIC VEIN TRANSPOSITION Right 01/12/2022   Procedure: RIGHT ARM SECOND STAGE BASILIC VEIN TRANSPOSITION;  Surgeon: Oris Krystal FALCON, MD;  Location: [facility];  Service: Vascular;  Laterality: Right;   BIOPSY  08/27/2016   Procedure: BIOPSY;  Surgeon: Margo LITTIE Haddock, MD;  Location: [facility];  Service: Endoscopy;;  duodenum; gastric   BIOPSY  06/27/2019   Procedure: BIOPSY;  Surgeon: Shaaron Lamar HERO, MD;  Location: [facility];  Service: Endoscopy;;   BIOPSY  02/09/2022   Procedure: BIOPSY;  Surgeon: Cindie Carlin POUR, DO;  Location: [facility];  Service: Endoscopy;;   BREAST CYST  EXCISION Right 11/12/2021   Procedure: RIGHT BREAST ABSCESS INCISION AND DRAINAGE;  Surgeon: Ebbie Cough, MD;  Location: [facility] OR;  Service: General;  Laterality: Right;  LMA   CAPD REMOVAL N/A 07/12/2023   Procedure: REMOVAL, CATHETER, CAPD;  Surgeon: Lanis Fonda BRAVO, MD;  Location: [facility] OR;  Service: Vascular;  Laterality: N/A;   COLONOSCOPY     COLONOSCOPY WITH PROPOFOL  N/A 02/09/2022   Procedure: COLONOSCOPY WITH PROPOFOL ;  Surgeon: Cindie Carlin POUR, DO;  Location: [facility];  Service: Endoscopy;  Laterality: N/A;  7:30am, asa 3, dialysis pt   ESOPHAGOGASTRODUODENOSCOPY N/A 08/27/2016   Dr. Haddock: mild gastritis. Negative celiac. No obvious source for dyspepsia/diarrhea   ESOPHAGOGASTRODUODENOSCOPY (EGD) WITH PROPOFOL  N/A 06/27/2019   rourk: Focal abnormality of the gastric mucosa likely due to trauma (heaving).  Biopsy showed mild gastritis, negative for H. pylori.  Esophageal dilation for history of dysphagia but normal-appearing esophagus.   ESOPHAGOGASTRODUODENOSCOPY (EGD) WITH PROPOFOL  N/A 02/09/2022   Procedure: ESOPHAGOGASTRODUODENOSCOPY (EGD) WITH PROPOFOL ;  Surgeon: Cindie Carlin POUR, DO;  Location: [facility];  Service: Endoscopy;  Laterality: N/A;   IR FLUORO GUIDE CV LINE RIGHT  10/13/2021   IR FLUORO GUIDE CV LINE RIGHT  10/19/2021   IR US  GUIDE VASC ACCESS RIGHT  10/13/2021   IRRIGATION AND DEBRIDEMENT ABSCESS N/A 10/04/2021   Procedure: IRRIGATION AND DEBRIDEMENT NECK ABSCESS;  Surgeon: Rubin Calamity, MD;  Location: [facility] OR;  Service: General;  Laterality: N/A;   TRANSESOPHAGEAL ECHOCARDIOGRAM (CATH LAB) N/A 07/08/2023   Procedure: TRANSESOPHAGEAL ECHOCARDIOGRAM;  Surgeon: Loni Soyla LABOR, MD;  Location: [facility] INVASIVE CV LAB;  Service: Cardiovascular;  Laterality: N/A;    Social History   Socioeconomic History   Marital status: Single    Spouse name: Not on file   Number of children: 0   Years of education: Not on file   Highest education level: High school graduate   Occupational History   Occupation: unemployed  Tobacco Use   Smoking status: Never    Passive exposure: Never   Smokeless tobacco: Never  Vaping Use   Vaping status: Never Used  Substance and Sexual Activity   Alcohol  use: No   Drug use: No   Sexual activity: Not Currently    Birth control/protection: Abstinence, None  Other Topics Concern   Not on file  Social History Narrative   Not on file   Social Drivers of Health   Financial Resource Strain: Low Risk  (04/28/2023)   [facility] from [facility]   Overall Financial Resource Strain (CARDIA)    Difficulty of Paying Living Expenses: Not hard at all  Food Insecurity: No Food Insecurity (07/21/2023)   Hunger Vital Sign    Worried About Running Out of Food in the Last Year: Never true    Ran Out of Food in the Last Year: Never true  Transportation Needs: No Transportation Needs (07/21/2023)   PRAPARE - Administrator, Civil Service (Medical): No    Lack of Transportation (Non-Medical): No  Physical Activity: Insufficiently Active (01/14/2023)   Exercise Vital Sign    Days of Exercise per Week: 4 days    Minutes of Exercise per Session: 30 min  Stress: No Stress Concern Present (05/24/2023)   Received from [facility] - Occupational Stress Questionnaire    Feeling of Stress : Only a little  Social Connections: Moderately Isolated (01/14/2023)   Social Connection and Isolation Panel    Frequency of Communication with Friends and Family: More than three times a week    Frequency of Social Gatherings with Friends and Family: More than three times a week    Attends Religious Services: More than 4 times per year    Active Member of [employer] or Organizations: No    Attends Engineer, structural: Never    Marital Status: Never married    Family History  Problem Relation Age of Onset   Alcohol  abuse Paternal Grandfather    Huntington's disease Maternal Grandfather     Cancer Father        pancreatic   Diabetes Mother        Type II DM   Colon cancer Neg Hx    Colon polyps Neg Hx     Outpatient Encounter Medications as of 02/08/2024  Medication Sig   acetaminophen  (TYLENOL ) 325 MG tablet Take 325 mg by mouth every 6 (six) hours as needed for mild pain (pain score 1-3) or moderate pain (pain score 4-6).   calcitRIOL  (ROCALTROL ) 0.5 MCG capsule Take 1 mcg by mouth daily.   Continuous Glucose Receiver (DEXCOM G7 RECEIVER) DEVI by Does not apply  route.   insulin  lispro (HUMALOG ) 100 UNIT/ML injection Use with Omnipod for TDD around 50 units   lipase/protease/amylase (CREON ) 12000-38000 units CPEP capsule Take 2 capsules (24,000 Units total) by mouth 3 (three) times daily with meals.   loperamide  (IMODIUM ) 2 MG capsule Take 1 capsule (2 mg total) by mouth as needed for diarrhea or loose stools.   metoprolol  succinate (TOPROL -XL) 50 MG 24 hr tablet Take 50 mg by mouth daily.   midodrine  (PROAMATINE ) 5 MG tablet Take 1 tablet (5 mg total) by mouth 3 (three) times daily with meals.   Nutritional Supplements (,FEEDING SUPPLEMENT, PROSOURCE PLUS) liquid Take 30 mLs by mouth 2 (two) times daily between meals.   Olopatadine -Mometasone (RYALTRIS ) 665-25 MCG/ACT SUSP Place 2 sprays into the nose 2 (two) times daily as needed.   pantoprazole  (PROTONIX ) 40 MG tablet Take 1 tablet (40 mg total) by mouth 2 (two) times daily before a meal.   sucroferric oxyhydroxide (VELPHORO ) 500 MG chewable tablet Chew 1,000 mg by mouth 3 (three) times daily with meals.   VENTOLIN  HFA 108 (90 Base) MCG/ACT inhaler Inhale 2 puffs into the lungs every 4 (four) hours as needed for wheezing or shortness of breath. 2 puffs once every 4 hours as needed for cough, wheeze, shortness of breath and chest tightness. May use 2 puffs 5 to 15 minutes before activity to decrease cough or wheeze.   zolpidem  (AMBIEN ) 5 MG tablet Take 1 tablet (5 mg total) by mouth at bedtime as needed for sleep.    [DISCONTINUED] TRESIBA  FLEXTOUCH 100 UNIT/ML FlexTouch Pen Inject 3 Units into the skin at bedtime. (Patient taking differently: Inject 15 Units into the skin at bedtime.)   B Complex-C-Folic Acid  (DIALYVITE  800) 0.8 MG TABS Take 1 tablet by mouth daily.   Continuous Glucose Sensor (DEXCOM G7 SENSOR) MISC by Does not apply route.   Insulin  Disposable Pump (OMNIPOD 5 DEXG7G6 INTRO GEN 5) KIT Change pod every 48-72 hours (Patient not taking: Reported on 02/08/2024)   Insulin  Disposable Pump (OMNIPOD 5 DEXG7G6 PODS GEN 5) MISC Change pod every 48-72 hours (Patient not taking: Reported on 02/08/2024)   [DISCONTINUED] rosuvastatin  (CRESTOR ) 10 MG tablet Take 1 tablet (10 mg total) by mouth daily. (Patient not taking: Reported on 02/08/2024)   No facility-administered encounter medications on file as of 02/08/2024.    ALLERGIES: No Known Allergies  VACCINATION STATUS: Immunization History  Administered Date(s) Administered   Hepb-cpg 10/30/2021, 11/25/2021, 12/23/2021, 02/24/2022   Influenza,inj,Quad PF,6+ Mos 03/13/2013, 02/25/2014, 04/09/2016, 01/26/2017, 01/18/2019, 06/15/2021, 02/08/2022   PNEUMOCOCCAL CONJUGATE-20 10/28/2021   Td 04/15/2017   Td (Adult),5 Lf Tetanus Toxid, Preservative Free 04/15/2017    Diabetes She presents for her follow-up diabetic visit. She has type 1 diabetes mellitus. Her disease course has been fluctuating. There are no hypoglycemic associated symptoms. Pertinent negatives for diabetes include no polydipsia and no polyuria. There are no hypoglycemic complications. Symptoms are improving. Diabetic complications include nephropathy and peripheral neuropathy. (Non adherence to medical treatment.) Risk factors for coronary artery disease include diabetes mellitus, dyslipidemia and sedentary lifestyle. Current diabetic treatment includes intensive insulin  program. She is compliant with treatment most of the time. Her weight is fluctuating minimally. She is following a  generally unhealthy diet. When asked about meal planning, she reported none. She never participates in exercise. Her overall blood glucose range is >200 mg/dl. (She presents today with her CGM showing gross hyperglycemia overall.  Her POCT A1c today is 9%, increasing from last visit of 7.3%.   Analysis  of her CGM shows TIR 27%, TAR 73%, TBR 0% with a GMI of 9.6%.  She has her Omnipod 5 kit and supplies, scheduled to have training tomorrow.) An ACE inhibitor/angiotensin II receptor blocker is being taken.    Review of systems  Constitutional: + Minimally fluctuating body weight,  current Body mass index is 17.84 kg/m. , no fatigue, no subjective hyperthermia, no subjective hypothermia Eyes: + blurry vision, no xerophthalmia ENT: no sore throat, no nodules palpated in throat, no dysphagia/odynophagia, no hoarseness Cardiovascular: no chest pain, no shortness of breath, no palpitations, no leg swelling Respiratory: no cough, no shortness of breath Gastrointestinal: no nausea/vomiting/diarrhea Musculoskeletal: walks with cane Skin: no rashes, no hyperemia Neurological: no tremors, no numbness, no tingling, no dizziness Psychiatric: no depression, no anxiety   Objective:    BP 92/60 (BP Location: Left Arm, Patient Position: Sitting, Cuff Size: Large)   Pulse (!) 101   Ht 5' 9 (1.753 m)   Wt 120 lb 12.8 oz (54.8 kg)   BMI 17.84 kg/m   Wt Readings from Last 3 Encounters:  02/08/24 120 lb 12.8 oz (54.8 kg)  12/22/23 125 lb (56.7 kg)  10/05/23 121 lb (54.9 kg)    BP Readings from Last 3 Encounters:  02/08/24 92/60  12/22/23 122/84  10/05/23 128/78     Physical Exam- Limited  Constitutional:  Body mass index is 17.84 kg/m. , not in acute distress, mildly anxious/distracted state of mind Eyes:  EOMI, no exophthalmos Musculoskeletal: walks with cane Skin:  no rashes, no hyperemia Neurological: no tremor with outstretched hands   Diabetic Foot Exam - Simple   No data filed       CMP ( most recent) CMP     Component Value Date/Time   NA 136 07/19/2023 0516   NA 132 (L) 09/24/2021 1453   K 3.7 07/19/2023 0516   CL 104 07/19/2023 0516   CO2 26 07/19/2023 0516   GLUCOSE 157 (H) 07/19/2023 0516   BUN 10 07/19/2023 0516   BUN 36 (H) 09/24/2021 1453   CREATININE 4.10 (H) 07/19/2023 0516   CREATININE 0.98 08/29/2019 1637   CALCIUM  7.9 (L) 07/19/2023 0516   PROT 4.8 (L) 07/18/2023 0535   PROT 5.5 (L) 09/24/2021 1453   ALBUMIN  <1.5 (L) 07/18/2023 0535   ALBUMIN  2.1 (L) 09/24/2021 1453   AST 19 07/18/2023 0535   ALT <5 07/18/2023 0535   ALKPHOS 89 07/18/2023 0535   BILITOT 0.4 07/18/2023 0535   BILITOT <0.2 09/24/2021 1453   GFRNONAA 14 (L) 07/19/2023 0516   GFRAA >60 10/27/2019 2021     Diabetic Labs (most recent): Lab Results  Component Value Date   HGBA1C 9.0 (A) 02/08/2024   HGBA1C 7.3 (A) 10/05/2023   HGBA1C 7.2 (A) 06/23/2023   MICROALBUR 1,195.2 (H) 06/13/2021   MICROALBUR 0.50 11/28/2012   MICROALBUR 0.50 07/20/2012     Lipid Panel ( most recent) Lipid Panel     Component Value Date/Time   CHOL 190 07/20/2022 1440   TRIG 500 (H) 07/20/2022 1440   HDL 73 07/20/2022 1440   CHOLHDL 2.6 07/20/2022 1440   CHOLHDL 2.0 11/05/2015 1252   VLDL 12 11/05/2015 1252   LDLCALC 46 07/20/2022 1440   LABVLDL 71 (H) 07/20/2022 1440      Lab Results  Component Value Date   TSH 2.185 07/17/2023   TSH 1.060 10/26/2022   TSH 0.975 10/03/2021   TSH 2.520 05/26/2021   TSH 1.802 05/14/2021   TSH 0.342 (L) 05/27/2018  TSH 0.55 11/05/2015   TSH 1.175 11/28/2012   TSH 0.706 07/20/2012   TSH 1.008 08/26/2011   FREET4 1.19 10/26/2022   FREET4 1.4 11/05/2015   FREET4 1.26 11/28/2012   FREET4 1.44 07/20/2012   FREET4 1.23 08/26/2011      Assessment & Plan:   1) Uncontrolled type 1 diabetes mellitus with ESRD    Tyquasia R Casaus has currently uncontrolled symptomatic type (?1) DM since 28 years of age.  She presents today with her CGM  showing gross hyperglycemia overall.  Her POCT A1c today is 9%, increasing from last visit of 7.3%.   Analysis of her CGM shows TIR 27%, TAR 73%, TBR 0% with a GMI of 9.6%.  She has her Omnipod 5 kit and supplies, scheduled to have training tomorrow.  - I had a long discussion with her about the progressive nature of diabetes and the pathology behind its complications. -her diabetes is complicated by peripheral neuropathy, ESRD, noncompliance/nonadherence, peripheral ? Lymphedema she remains at a high risk for more acute and chronic complications which include CAD, CVA, CKD, retinopathy, and neuropathy. These are all discussed in detail with her.  - Nutritional counseling repeated at each appointment due to patients tendency to fall back in to old habits.  - The patient admits there is a room for improvement in their diet and drink choices. -  Suggestion is made for the patient to avoid simple carbohydrates from their diet including Cakes, Sweet Desserts / Pastries, Ice Cream, Soda (diet and regular), Sweet Tea, Candies, Chips, Cookies, Sweet Pastries, Store Bought Juices, Alcohol  in Excess of 1-2 drinks a day, Artificial Sweeteners, Coffee Creamer, and Sugar-free Products. This will help patient to have stable blood glucose profile and potentially avoid unintended weight gain.   - I encouraged the patient to switch to unprocessed or minimally processed complex starch and increased protein intake (animal or plant source), fruits, and vegetables.   - Patient is advised to stick to a routine mealtimes to eat 3 meals a day and avoid unnecessary snacks (to snack only to correct hypoglycemia).  - I have approached her with the following plan to manage  her diabetes and patient agrees:   -She is advised to continue Tresiba  15 units SQ nightly and Humalog  7-10 units TID with meals if glucose is above 90 and she is eating (Specific instructions on how to titrate insulin  dosage based on glucose readings  given to patient in writing).   She has Omnipod 5 training tomorrow.  She knows to stop the Tresiba  when she starts the pump.  She is advised to monitor glucose 4 times daily (using her CGM) and call the clinic if she has readings less than 70 or above 200 for 3 tests in a row.    -We did talk about possibility of insulin  pump in the future. I did go ahead and send in for the Omnipod [facility] to [facility] pharmacy to see if it is affordable for her.  - she is warned not to take insulin  without proper monitoring per orders. - She does not have a suitable , non insulin  option to treat her diabetes.  - Specific targets for  A1c;  LDL, HDL,  and Triglycerides were discussed with the patient.  2) Blood Pressure /Hypertension:  -Her blood pressure is controlled to target without the use of antihypertensives.  Changes will be deferred to nephrology.  3) Lipids/Hyperlipidemia:  Her recent lipid panel from 07/20/22 shows controlled LDL of 46 and significantly elevated triglycerides  of 500.  she is advised to continue Lipitor 10 mg p.o. daily at bedtime.  Side effects and precautions discussed with her.  4)  Weight/Diet:  Body mass index is 17.84 kg/m.   She is not a candidate for weight loss.    5) Chronic Care/Health Maintenance: -she is not on ACEI/ARB and is on Statin medications and  is encouraged to initiate and continue to follow up with Ophthalmology, Dentist,  Podiatrist at least yearly or according to recommendations, and advised to  stay away from smoking. I have recommended yearly flu vaccine and pneumonia vaccine at least every 5 years; moderate intensity exercise for up to 150 minutes weekly; and  sleep for 7- 9 hours a day.  - she is  advised to maintain close follow up with Cook, Jayce G, DO for primary care needs, as well as her other providers for optimal and coordinated care.     I spent  43  minutes in the care of the patient today including review of labs from CMP, Lipids, Thyroid   Function, Hematology (current and previous including abstractions from other facilities); face-to-face time discussing  her blood glucose readings/logs, discussing hypoglycemia and hyperglycemia episodes and symptoms, medications doses, her options of short and long term treatment based on the latest standards of care / guidelines;  discussion about incorporating lifestyle medicine;  and documenting the encounter. Risk reduction counseling performed per USPSTF guidelines to reduce obesity and cardiovascular risk factors.     Please refer to Patient Instructions for Blood Glucose Monitoring and Insulin /Medications Dosing Guide  in media tab for additional information. Please  also refer to  Patient Self Inventory in the Media  tab for reviewed elements of pertinent patient history.  [employer] participated in the discussions, expressed understanding, and voiced agreement with the above plans.  All questions were answered to her satisfaction. she is encouraged to contact clinic should she have any questions or concerns prior to her return visit.   Follow up plan: - Return in about 4 months (around 06/10/2024) for Diabetes F/U with A1c in office, No previsit labs, Bring meter and logs.   Benton Rio, [facility] [address] [place], [place] [address] Phone: [phone] Fax: [phone]

## 2024-02-13 ENCOUNTER — Ambulatory Visit: Admitting: Family Medicine

## 2024-02-14 ENCOUNTER — Encounter: Payer: Self-pay | Admitting: Family Medicine

## 2024-02-14 ENCOUNTER — Ambulatory Visit (INDEPENDENT_AMBULATORY_CARE_PROVIDER_SITE_OTHER): Admitting: Family Medicine

## 2024-02-14 VITALS — BP 86/55 | HR 97 | Ht 67.0 in | Wt 119.0 lb

## 2024-02-14 DIAGNOSIS — I959 Hypotension, unspecified: Secondary | ICD-10-CM

## 2024-02-14 DIAGNOSIS — E1065 Type 1 diabetes mellitus with hyperglycemia: Secondary | ICD-10-CM | POA: Diagnosis not present

## 2024-02-14 MED ORDER — OMNIPOD 5 DEXG7G6 PODS GEN 5 MISC: Status: DC

## 2024-02-14 NOTE — Patient Instructions (Signed)
 I am going to reach out to Nephrology about your BP.  Continue your medications  Follow up in 6 months to 1 year.

## 2024-02-15 DIAGNOSIS — I959 Hypotension, unspecified: Secondary | ICD-10-CM | POA: Insufficient documentation

## 2024-02-15 MED ORDER — MIDODRINE HCL 10 MG PO TABS: 10.0000 mg | ORAL_TABLET | Freq: Three times a day (TID) | ORAL | 3 refills | Status: AC

## 2024-02-15 NOTE — Assessment & Plan Note (Signed)
 Remains uncontrolled.  Suspect noncompliance.  Continue close follow-up with endocrinology.

## 2024-02-15 NOTE — Assessment & Plan Note (Signed)
 Reached out to nephrology, Dr. Rayburn.  He is okay with restarting midodrine .

## 2024-02-15 NOTE — Progress Notes (Signed)
 Subjective:  Patient ID: Lindsey Murillo, female    DOB: 1996/03/05  Age: 28 y.o. MRN: [id number]  CC:   Chief Complaint  Patient presents with   Care Management    Six month follow up    HPI:  28 year old female presents for follow-up.  Patient following closely with endocrinology.  Diabetes remains uncontrolled.  Patient is being seen by transplant in hopes of getting a renal transplant in the future.  She has end-stage renal disease.  Hemodialysis Monday, Wednesday, Friday.  Patient's BP is low here today.  She denies any symptoms.  Patient previously on midodrine .  This has been discontinued.  I am not sure why.  Patient Active Problem List   Diagnosis Date Noted   Hypotension 02/15/2024   Other disorders of phosphorus metabolism 10/04/2023   Insomnia 09/12/2023   Not well controlled moderate persistent asthma 08/15/2023   Allergic rhinitis due to allergen 08/15/2023   Protein-calorie malnutrition, severe 07/19/2023   Insulin  long-term use ([name]) 12/23/2022   Amenorrhea, secondary 10/26/2022   Vitamin D  deficiency 08/26/2022   Hyperlipidemia 07/21/2022   Non-adherence to medical treatment 01/21/2022   ESRD (end stage renal disease) ([name]) 12/18/2021   Secondary hyperparathyroidism of renal origin 10/21/2021   Physical deconditioning 10/07/2021   Anemia in ESRD (end-stage renal disease) ([name]) 09/25/2021   Nephrotic syndrome 05/27/2021   Chronic pancreatitis ([name]) 08/22/2019   Pancreatic pseudocyst/cyst 08/22/2019   GERD (gastroesophageal reflux disease) 08/22/2019   Uncontrolled type 1 diabetes mellitus with hyperglycemia ([name]) 05/12/2015    Social Hx   Social History   Socioeconomic History   Marital status: Single    Spouse name: Not on file   Number of children: 0   Years of education: Not on file   Highest education level: High school graduate  Occupational History   Occupation: unemployed  Tobacco Use   Smoking status: Never    Passive exposure:  Never   Smokeless tobacco: Never  Vaping Use   Vaping status: Never Used  Substance and Sexual Activity   Alcohol  use: No   Drug use: No   Sexual activity: Not Currently    Birth control/protection: Abstinence, None  Other Topics Concern   Not on file  Social History Narrative   Not on file   Social Drivers of Health   Financial Resource Strain: Low Risk  (04/28/2023)   Received from [facility]   Overall Financial Resource Strain (CARDIA)    Difficulty of Paying Living Expenses: Not hard at all  Food Insecurity: No Food Insecurity (07/21/2023)   Hunger Vital Sign    Worried About Running Out of Food in the Last Year: Never true    Ran Out of Food in the Last Year: Never true  Transportation Needs: No Transportation Needs (07/21/2023)   PRAPARE - Administrator, Civil Service (Medical): No    Lack of Transportation (Non-Medical): No  Physical Activity: Insufficiently Active (01/14/2023)   Exercise Vital Sign    Days of Exercise per Week: 4 days    Minutes of Exercise per Session: 30 min  Stress: No Stress Concern Present (05/24/2023)   Received from [facility] - Occupational Stress Questionnaire    Feeling of Stress : Only a little  Social Connections: Moderately Isolated (01/14/2023)   Social Connection and Isolation Panel    Frequency of Communication with Friends and Family: More than three times a week    Frequency of Social Gatherings  with Friends and Family: More than three times a week    Attends Religious Services: More than 4 times per year    Active Member of Clubs or Organizations: No    Attends Banker Meetings: Never    Marital Status: Never married    Review of Systems Per HPI  Objective:  BP (!) 86/55   Pulse 97   Ht 5' 7 (1.702 m)   Wt 119 lb (54 kg)   SpO2 100%   BMI 18.64 kg/m      02/14/2024    3:07 PM 02/08/2024    3:36 PM 12/22/2023    4:59 PM  BP/Weight  Systolic BP 86  92 122  Diastolic BP 55 60 84  Wt. (Lbs) 119 120.8 125  BMI 18.64 kg/m2 17.84 kg/m2 18.46 kg/m2    Physical Exam Vitals and nursing note reviewed.  Constitutional:      Comments: Chronically ill-appearing.  HENT:     Head: Normocephalic and atraumatic.  Cardiovascular:     Rate and Rhythm: Normal rate and regular rhythm.  Pulmonary:     Effort: Pulmonary effort is normal.     Breath sounds: Normal breath sounds. No wheezing or rales.  Neurological:     Mental Status: She is alert.  Psychiatric:     Comments: Flat affect.  Depressed mood.     Lab Results  Component Value Date   WBC 11.9 (H) 07/19/2023   HGB 7.8 (L) 07/19/2023   HCT 24.6 (L) 07/19/2023   PLT 237 07/19/2023   GLUCOSE 157 (H) 07/19/2023   CHOL 190 07/20/2022   TRIG 500 (H) 07/20/2022   HDL 73 07/20/2022   LDLCALC 46 07/20/2022   ALT <5 07/18/2023   AST 19 07/18/2023   NA 136 07/19/2023   K 3.7 07/19/2023   CL 104 07/19/2023   CREATININE 4.10 (H) 07/19/2023   BUN 10 07/19/2023   CO2 26 07/19/2023   TSH 2.185 07/17/2023   INR 1.4 (H) 07/04/2023   HGBA1C 9.0 (A) 02/08/2024   MICROALBUR 1,195.2 (H) 06/13/2021     Assessment & Plan:  Uncontrolled type 1 diabetes mellitus with hyperglycemia ([name]) Assessment & Plan: Remains uncontrolled.  Suspect noncompliance.  Continue close follow-up with endocrinology.   Hypotension, unspecified hypotension type Assessment & Plan: Reached out to nephrology, Dr. Rayburn.  He is okay with restarting midodrine .   Other orders -     Omnipod 5 DexG7G6 Pods Gen 5; Change pod every 48-72 hours -     Midodrine  HCl; Take 1 tablet (10 mg total) by mouth 3 (three) times daily.  Dispense: 270 tablet; Refill: 3   Follow-up:  6 months  Jervon Ream Bluford DO [facility]

## 2024-02-24 ENCOUNTER — Telehealth: Payer: Self-pay | Admitting: Family Medicine

## 2024-02-24 DIAGNOSIS — E1165 Type 2 diabetes mellitus with hyperglycemia: Secondary | ICD-10-CM | POA: Diagnosis not present

## 2024-02-24 NOTE — Telephone Encounter (Signed)
 Patient is requesting prescription for meclizine 25mg   [facility]

## 2024-02-27 ENCOUNTER — Telehealth: Payer: Self-pay

## 2024-02-27 ENCOUNTER — Other Ambulatory Visit: Payer: Self-pay | Admitting: Family Medicine

## 2024-02-27 MED ORDER — MECLIZINE HCL 25 MG PO TABS: 25.0000 mg | ORAL_TABLET | Freq: Two times a day (BID) | ORAL | 0 refills | Status: AC | PRN

## 2024-02-27 NOTE — Telephone Encounter (Signed)
 Cook, Jayce G, [place]      02/27/24 12:23 PM Rx sent in.

## 2024-02-27 NOTE — Telephone Encounter (Signed)
 Prescription Request  02/27/2024  LOV: Visit date not found  What is the name of the medication or equipment? Meclizine 25 gm, Ondansetron  ODT 4mg  Tab   Have you contacted your pharmacy to request a refill? Yes   Which pharmacy would you like this sent to?    [facility]    Patient notified that their request is being sent to the clinical staff for review and that they should receive a response within 2 business days.   Please advise at Mobile [phone] (mobile)

## 2024-02-27 NOTE — Telephone Encounter (Signed)
Duplicate- see other message

## 2024-02-28 ENCOUNTER — Telehealth: Payer: Self-pay | Admitting: Family Medicine

## 2024-02-28 ENCOUNTER — Other Ambulatory Visit: Payer: Self-pay | Admitting: Family Medicine

## 2024-02-28 MED ORDER — ONDANSETRON 4 MG PO TBDP: 4.0000 mg | ORAL_TABLET | Freq: Three times a day (TID) | ORAL | 0 refills | Status: AC | PRN

## 2024-02-28 NOTE — Telephone Encounter (Signed)
 Patient requesting prescription for  ondansetron  ODT  $mg   [facility]

## 2024-02-28 NOTE — Telephone Encounter (Signed)
 Cook, Jayce G, [place]      02/28/24  1:03 PM Rx sent

## 2024-03-06 ENCOUNTER — Encounter: Payer: Self-pay | Admitting: Podiatry

## 2024-03-06 ENCOUNTER — Encounter (INDEPENDENT_AMBULATORY_CARE_PROVIDER_SITE_OTHER): Admitting: Podiatry

## 2024-03-06 DIAGNOSIS — Z91199 Patient's noncompliance with other medical treatment and regimen due to unspecified reason: Secondary | ICD-10-CM

## 2024-03-06 NOTE — Progress Notes (Signed)
 Patient did not show for her scheduled appointment this morning.

## 2024-03-12 ENCOUNTER — Encounter: Payer: Self-pay | Admitting: Radiology

## 2024-03-23 ENCOUNTER — Encounter (HOSPITAL_COMMUNITY): Admission: RE | Disposition: A | Payer: Self-pay | Attending: Vascular Surgery

## 2024-03-23 ENCOUNTER — Other Ambulatory Visit: Payer: Self-pay

## 2024-03-23 ENCOUNTER — Ambulatory Visit (HOSPITAL_COMMUNITY)
Admission: RE | Admit: 2024-03-23 | Discharge: 2024-03-23 | Disposition: A | Discharge status: Home / Self Care | Attending: Vascular Surgery | Admitting: Vascular Surgery

## 2024-03-23 ENCOUNTER — Encounter (HOSPITAL_COMMUNITY): Payer: Self-pay | Admitting: Vascular Surgery

## 2024-03-23 DIAGNOSIS — Z794 Long term (current) use of insulin: Secondary | ICD-10-CM | POA: Diagnosis not present

## 2024-03-23 DIAGNOSIS — E1022 Type 1 diabetes mellitus with diabetic chronic kidney disease: Secondary | ICD-10-CM | POA: Diagnosis not present

## 2024-03-23 DIAGNOSIS — N186 End stage renal disease: Secondary | ICD-10-CM | POA: Insufficient documentation

## 2024-03-23 DIAGNOSIS — T82858A Stenosis of vascular prosthetic devices, implants and grafts, initial encounter: Secondary | ICD-10-CM | POA: Diagnosis present

## 2024-03-23 DIAGNOSIS — Y832 Surgical operation with anastomosis, bypass or graft as the cause of abnormal reaction of the patient, or of later complication, without mention of misadventure at the time of the procedure: Secondary | ICD-10-CM | POA: Diagnosis not present

## 2024-03-23 DIAGNOSIS — Z992 Dependence on renal dialysis: Secondary | ICD-10-CM | POA: Insufficient documentation

## 2024-03-23 DIAGNOSIS — Z9641 Presence of insulin pump (external) (internal): Secondary | ICD-10-CM | POA: Diagnosis not present

## 2024-03-23 DIAGNOSIS — I120 Hypertensive chronic kidney disease with stage 5 chronic kidney disease or end stage renal disease: Secondary | ICD-10-CM | POA: Diagnosis not present

## 2024-03-23 HISTORY — PX: A/V FISTULAGRAM: CATH118298

## 2024-03-23 HISTORY — PX: VENOUS ANGIOPLASTY: CATH118376

## 2024-03-23 SURGERY — A/V FISTULAGRAM
Anesthesia: LOCAL | Site: Arm Upper | Laterality: Right

## 2024-03-23 MED ORDER — IODIXANOL 320 MG/ML IV SOLN
INTRAVENOUS | Status: DC | PRN
Start: 2024-03-23 — End: 2024-03-23
  Administered 2024-03-23: 25 mL via INTRAVENOUS

## 2024-03-23 MED ORDER — LIDOCAINE HCL (PF) 1 % IJ SOLN
INTRAMUSCULAR | Status: DC | PRN
  Administered 2024-03-23: 2 mL via SUBCUTANEOUS

## 2024-03-23 MED ORDER — HEPARIN (PORCINE) IN NACL 1000-0.9 UT/500ML-% IV SOLN
INTRAVENOUS | Status: DC | PRN
  Administered 2024-03-23: 500 mL

## 2024-03-23 MED ORDER — LIDOCAINE HCL (PF) 1 % IJ SOLN
INTRAMUSCULAR | Status: AC
  Filled 2024-03-23: qty 30

## 2024-03-23 SURGICAL SUPPLY — 10 items
BALLOON MUSTANG 8X80X75 (BALLOONS) IMPLANT
BALLOON MUSTANG 9X80X75 (BALLOONS) IMPLANT
DEVICE INFLATION ENCORE 26 (MISCELLANEOUS) IMPLANT
KIT MICROPUNCTURE NIT STIFF (SHEATH) IMPLANT
SHEATH PINNACLE R/O II 6F 4CM (SHEATH) IMPLANT
SHEATH PROBE COVER 6X72 (BAG) IMPLANT
STOPCOCK MORSE 400PSI 3WAY (MISCELLANEOUS) IMPLANT
TRAY PV CATH (CUSTOM PROCEDURE TRAY) ×2 IMPLANT
TUBING CIL FLEX 10 FLL-RA (TUBING) IMPLANT
WIRE BENTSON .035X145CM (WIRE) IMPLANT

## 2024-03-23 NOTE — H&P (Addendum)
 H&P  MRN #:  [id number]  History of Present Illness: This is a 28 y.o. female with end-stage renal disease that presents for right arm fistulogram due to malfunction of basilic vein AV fistula.  States they have been having trouble with flow volumes over the last several weeks.  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   Diabetic autonomic neuropathy ([name])    Diabetic peripheral neuropathy ([name])    Environmental allergies    Goiter    History of blood transfusion    Hypertension    no meds currently   Hypoglycemia associated with diabetes ([name])    Musculoskeletal pain 04/20/2022   Other disorders of phosphorus metabolism 10/04/2023   Tachycardia    Thyroiditis, autoimmune    Type 1 diabetes mellitus in patient age 4-19 years with HbA1C goal below 7.5     Past Surgical History:  Procedure Laterality Date   AV FISTULA PLACEMENT Right 12/08/2021   Procedure: ARTERIOVENOUS  FISTULA CREATION VERSUS GRAFT;  Surgeon: Oris Krystal FALCON, MD;  Location: [facility];  Service: Vascular;  Laterality: Right;   BASCILIC VEIN TRANSPOSITION Right 01/12/2022   Procedure: RIGHT ARM SECOND STAGE BASILIC VEIN TRANSPOSITION;  Surgeon: Oris Krystal FALCON, MD;  Location: [facility];  Service: Vascular;  Laterality: Right;   BIOPSY  08/27/2016   Procedure: BIOPSY;  Surgeon: Margo LITTIE Haddock, MD;  Location: [facility];  Service: Endoscopy;;  duodenum; gastric   BIOPSY  06/27/2019   Procedure: BIOPSY;  Surgeon: Shaaron Lamar HERO, MD;  Location: [facility];  Service: Endoscopy;;   BIOPSY  02/09/2022   Procedure: BIOPSY;  Surgeon: Cindie Carlin POUR, DO;  [facility];  Service: Endoscopy;;   BREAST CYST EXCISION Right 11/12/2021   Procedure: RIGHT BREAST ABSCESS  INCISION AND DRAINAGE;  Surgeon: Ebbie Cough, MD;  Location: [facility] OR;  Service: General;  Laterality: Right;  LMA   CAPD REMOVAL N/A 07/12/2023   Procedure: REMOVAL, CATHETER, CAPD;  Surgeon: Lanis Fonda BRAVO, MD;  Location: [facility] OR;  Service: Vascular;  Laterality: N/A;   COLONOSCOPY     COLONOSCOPY WITH PROPOFOL  N/A 02/09/2022   Procedure: COLONOSCOPY WITH PROPOFOL ;  Surgeon: Cindie Carlin POUR, DO;  [facility];  Service: Endoscopy;  Laterality: N/A;  7:30am, asa 3, dialysis pt   ESOPHAGOGASTRODUODENOSCOPY N/A 08/27/2016   Dr. Haddock: mild gastritis. Negative celiac. No obvious source for dyspepsia/diarrhea   ESOPHAGOGASTRODUODENOSCOPY (EGD) WITH PROPOFOL  N/A 06/27/2019   rourk: Focal abnormality of the gastric mucosa likely due to trauma (heaving).  Biopsy showed mild gastritis, negative for H. pylori.  Esophageal dilation for history of dysphagia but normal-appearing esophagus.   ESOPHAGOGASTRODUODENOSCOPY (EGD) WITH PROPOFOL  N/A 02/09/2022   Procedure: ESOPHAGOGASTRODUODENOSCOPY (EGD) WITH PROPOFOL ;  Surgeon: Cindie Carlin POUR, DO;  [facility];  Service: Endoscopy;  Laterality: N/A;   IR FLUORO GUIDE CV LINE RIGHT  10/13/2021   IR FLUORO GUIDE CV LINE RIGHT  10/19/2021   IR US  GUIDE VASC ACCESS RIGHT  10/13/2021   IRRIGATION AND DEBRIDEMENT ABSCESS N/A 10/04/2021   Procedure: IRRIGATION AND DEBRIDEMENT NECK ABSCESS;  Surgeon: Rubin Calamity, MD;  Location: [facility] OR;  Service: General;  Laterality: N/A;   TRANSESOPHAGEAL ECHOCARDIOGRAM (CATH LAB) N/A 07/08/2023   Procedure: TRANSESOPHAGEAL  ECHOCARDIOGRAM;  Surgeon: Loni Soyla LABOR, MD;  Location: [facility] INVASIVE CV LAB;  Service: Cardiovascular;  Laterality: N/A;    No Known Allergies  Prior to Admission medications   Medication Sig Start Date End Date Taking? Authorizing Provider  ondansetron  (ZOFRAN -ODT) 4 MG disintegrating tablet Take 1 tablet (4 mg total) by mouth every 8 (eight) hours as needed for nausea or vomiting.  02/28/24   Cook, Jayce G, DO  acetaminophen  (TYLENOL ) 325 MG tablet Take 325 mg by mouth every 6 (six) hours as needed for mild pain (pain score 1-3) or moderate pain (pain score 4-6). 12/24/21   [provider]  B Complex-C-Folic Acid  (DIALYVITE  800) 0.8 MG TABS Take 1 tablet by mouth daily. 11/16/21   [provider]  calcitRIOL  (ROCALTROL ) 0.5 MCG capsule Take 1 mcg by mouth daily.    [provider]  Continuous Glucose Receiver (DEXCOM G7 RECEIVER) DEVI by Does not apply route.    [provider]  Continuous Glucose Sensor (DEXCOM G7 SENSOR) MISC by Does not apply route.    [provider]  Insulin  Disposable Pump (OMNIPOD 5 DEXG7G6 PODS GEN 5) MISC Change pod every 48-72 hours 02/14/24   Cook, Jayce G, DO  insulin  lispro (HUMALOG ) 100 UNIT/ML injection Use with Omnipod for TDD around 50 units 12/29/23   Therisa Benton PARAS, NP  lipase/protease/amylase (CREON ) 12000-38000 units CPEP capsule Take 2 capsules (24,000 Units total) by mouth 3 (three) times daily with meals. 07/20/23   Barbarann Nest, MD  loperamide  (IMODIUM ) 2 MG capsule Take 1 capsule (2 mg total) by mouth as needed for diarrhea or loose stools. 07/20/23   Barbarann Nest, MD  meclizine (ANTIVERT) 25 MG tablet Take 1 tablet (25 mg total) by mouth 2 (two) times daily as needed for dizziness. 02/27/24   Cook, Jayce G, DO  metoprolol  succinate (TOPROL -XL) 50 MG 24 hr tablet Take 50 mg by mouth daily. 08/25/23   [provider]  midodrine  (PROAMATINE ) 10 MG tablet Take 1 tablet (10 mg total) by mouth 3 (three) times daily. 02/15/24   Cook, Jayce G, DO  Nutritional Supplements (,FEEDING SUPPLEMENT, PROSOURCE PLUS) liquid Take 30 mLs by mouth 2 (two) times daily between meals. 07/20/23   Barbarann Nest, MD  Olopatadine -Mometasone (RYALTRIS ) 665-25 MCG/ACT SUSP Place 2 sprays into the nose 2 (two) times daily as needed. 08/15/23   Cari Arlean HERO, FNP  pantoprazole  (PROTONIX ) 40 MG tablet Take 1  tablet (40 mg total) by mouth 2 (two) times daily before a meal. 03/22/23 03/16/24  Kennedy Charmaine CROME, NP  sucroferric oxyhydroxide (VELPHORO ) 500 MG chewable tablet Chew 1,000 mg by mouth 3 (three) times daily with meals. 11/16/21   [provider]  VENTOLIN  HFA 108 (90 Base) MCG/ACT inhaler Inhale 2 puffs into the lungs every 4 (four) hours as needed for wheezing or shortness of breath. 2 puffs once every 4 hours as needed for cough, wheeze, shortness of breath and chest tightness. May use 2 puffs 5 to 15 minutes before activity to decrease cough or wheeze. 03/24/23   Jeneal Danita Macintosh, MD  zolpidem  (AMBIEN ) 5 MG tablet Take 1 tablet (5 mg total) by mouth at bedtime as needed for sleep. 09/12/23   Cook, Jayce G, DO    Social History   Socioeconomic History   Marital status: Single    Spouse name: Not on file   Number of children: 0   Years of education: Not on file   Highest education level: High school graduate  Occupational History   Occupation: unemployed  Tobacco Use   Smoking status: Never    Passive exposure: Never   Smokeless tobacco: Never  Vaping Use   Vaping status: Never Used  Substance and Sexual Activity   Alcohol  use: No   Drug use: No   Sexual activity: Not Currently    Birth control/protection: Abstinence, None  Other Topics Concern   Not on file  Social History Narrative   Not on file   Social Drivers of Health   Financial Resource Strain: Low Risk  (04/28/2023)   Received from [facility]   Overall Financial Resource Strain (CARDIA)    Difficulty of Paying Living Expenses: Not hard at all  Food Insecurity: No Food Insecurity (07/21/2023)   Hunger Vital Sign    Worried About Running Out of Food in the Last Year: Never true    Ran Out of Food in the Last Year: Never true  Transportation Needs: No Transportation Needs (07/21/2023)   PRAPARE - Administrator, Civil Service (Medical): No    Lack of Transportation (Non-Medical): No   Physical Activity: Insufficiently Active (01/14/2023)   Exercise Vital Sign    Days of Exercise per Week: 4 days    Minutes of Exercise per Session: 30 min  Stress: No Stress Concern Present (05/24/2023)   Received from [facility] of Occupational Health - Occupational Stress Questionnaire    Feeling of Stress : Only a little  Social Connections: Moderately Isolated (01/14/2023)   Social Connection and Isolation Panel    Frequency of Communication with Friends and Family: More than three times a week    Frequency of Social Gatherings with Friends and Family: More than three times a week    Attends Religious Services: More than 4 times per year    Active Member of [employer] or Organizations: No    Attends Banker Meetings: Never    Marital Status: Never married  Intimate Partner Violence: Not At Risk (07/21/2023)   Humiliation, Afraid, Rape, and Kick questionnaire    Fear of Current or Ex-Partner: No    Emotionally Abused: No    Physically Abused: No    Sexually Abused: No     Family History  Problem Relation Age of Onset   Alcohol  abuse Paternal Grandfather    Huntington's disease Maternal Grandfather    Cancer Father        pancreatic   Diabetes Mother        Type II DM   Colon cancer Neg Hx    Colon polyps Neg Hx     ROS: [x]  Positive   [ ]  Negative   [ ]  All sytems reviewed and are negative  Cardiovascular: []  chest pain/pressure []  palpitations []  SOB lying flat []  DOE []  pain in legs while walking []  pain in legs at rest []  pain in legs at night []  non-healing ulcers []  hx of DVT []  swelling in legs  Pulmonary: []  productive cough []  asthma/wheezing []  home O2  Neurologic: []  weakness in []  arms []  legs []  numbness in []  arms []  legs []  hx of CVA []  mini stroke [] difficulty speaking or slurred speech []  temporary loss of vision in one eye []  dizziness  Hematologic: []  hx of cancer []  bleeding problems []  problems with  blood clotting easily  Endocrine:   []  diabetes []  thyroid  disease  GI []  vomiting blood []  blood in stool  GU: []  CKD/renal failure []  HD--[]  M/W/F  or []  T/T/S []  burning with urination []  blood in urine  Psychiatric: []  anxiety []  depression  Musculoskeletal: []  arthritis []  joint pain  Integumentary: []  rashes []  ulcers  Constitutional: []  fever []  chills   Physical Examination  Vitals:   03/23/24 0733 03/23/24 0740  BP: (!) 145/89 (!) 145/89  Pulse: 89 89  Resp: 12 14  Temp: 98 F (36.7 C)   SpO2: 99% 99%   There is no height or weight on file to calculate BMI.  General:  NAD Gait: Not observed HENT: WNL, normocephalic Pulmonary: normal non-labored breathing Cardiac: regular, without  Murmurs, rubs or gallops Abdomen:  soft, NT/ND Vascular Exam/Pulses: Right arm basilic vein fistula with great thrill   CBC    Component Value Date/Time   WBC 11.9 (H) 07/19/2023 0516   RBC 2.68 (L) 07/19/2023 0516   HGB 7.8 (L) 07/19/2023 0516   HGB 11.8 09/17/2022 1559   HCT 24.6 (L) 07/19/2023 0516   HCT 37.1 09/17/2022 1559   PLT 237 07/19/2023 0516   PLT 69 (LL) 09/17/2022 1559   MCV 91.8 07/19/2023 0516   MCV 85 09/17/2022 1559   MCH 29.1 07/19/2023 0516   MCHC 31.7 07/19/2023 0516   RDW 20.6 (H) 07/19/2023 0516   RDW 16.0 (H) 09/17/2022 1559   LYMPHSABS 3.1 07/19/2023 0516   MONOABS 1.1 (H) 07/19/2023 0516   EOSABS 0.4 07/19/2023 0516   BASOSABS 0.2 (H) 07/19/2023 0516    BMET    Component Value Date/Time   NA 136 07/19/2023 0516   NA 132 (L) 09/24/2021 1453   K 3.7 07/19/2023 0516   CL 104 07/19/2023 0516   CO2 26 07/19/2023 0516   GLUCOSE 157 (H) 07/19/2023 0516   BUN 10 07/19/2023 0516   BUN 36 (H) 09/24/2021 1453   CREATININE 4.10 (H) 07/19/2023 0516   CREATININE 0.98 08/29/2019 1637   CALCIUM  7.9 (L) 07/19/2023 0516   GFRNONAA 14 (L) 07/19/2023 0516   GFRAA >60 10/27/2019 2021    COAGS: Lab Results  Component Value Date   INR  1.4 (H) 07/04/2023   INR 1.4 (H) 10/03/2021   INR 1.2 10/01/2021     Non-Invasive Vascular Imaging:    N/a  ASSESSMENT/PLAN: This is a 28 y.o. female with end-stage renal disease that presents for right arm fistulogram due to malfunction of basilic vein AV fistula.  States they have been having trouble with flow volumes over the last several weeks.  Discussed plan for right arm fistulogram with fluoroscopic guidance with possible invention including angioplasty and stent.  All questions answered.  Lonni DOROTHA Gaskins, MD [facility] [place] Office: [phone]  Lonni JINNY Gaskins

## 2024-03-23 NOTE — Op Note (Signed)
    Patient name: Lindsey Murillo MRN: [id number] DOB: Apr 19, 1996 Sex: female  03/23/2024 Pre-operative Diagnosis: Malfunction right arm basilic vein AV fistula Post-operative diagnosis:  Same Surgeon:  Lonni DOROTHA Gaskins, MD Procedure Performed: 1.  Ultrasound-guided access right arm basilic vein fistula 2.  Right arm fistulogram including central venogram 3.  Peripheral angioplasty right basilic vein fistula (8 mm and 9 mm Mustang)  Indications: 28 year old female with end-stage renal disease using a right arm brachiobasilic AV fistula.  She presents due to malfunction of the fistula for fistulogram after risk-benefit discussed.  Findings:   Ultrasound-guided access right arm basilic vein fistula.  No evidence of central venous stenosis.  The fistula is widely patent except for about a 60% focal stenosis in the mid upper arm.  This was treated with a 8 mm and 9 mm Mustang with excellent results.  Great thrill at completion.   Procedure:  The patient was identified in the holding area and taken to [facility] lab.  Placed on the table in spine position.  The right arm was then prepped draped standard sterile fashion.  Timeout was performed.  Ultimately used ultrasound to evaluate the right arm fistula.  I injected 1% lidocaine  without epinephrine  in the skin.  The fistula was accessed with a micro access needle and placed a microwire and a micro sheath.  Right upper extremity fistulogram was obtained including central venogram.  There was a peripheral stenosis in the mid upper arm fistula.  I used a Bentson wire and exchanged for a short 6 French sheath.  The stenosis was treated with a 8 mm x 80 mm Mustang and then an 9 mm x 80 mm Mustang to nominal pressure for 2 minutes.  Widely patent fistula completion with no significant residual stenosis.  The sheath was removed after wires were removed.  Tied down the pursestring with good hemostasis.    Lonni DOROTHA Gaskins, MD [facility] [place] Office: [phone]

## 2024-04-16 ENCOUNTER — Ambulatory Visit: Admitting: Podiatry

## 2024-04-16 DIAGNOSIS — N949 Unspecified condition associated with female genital organs and menstrual cycle: Secondary | ICD-10-CM | POA: Insufficient documentation

## 2024-04-16 DIAGNOSIS — Z8679 Personal history of other diseases of the circulatory system: Secondary | ICD-10-CM | POA: Insufficient documentation

## 2024-04-18 ENCOUNTER — Ambulatory Visit: Admitting: Orthopedic Surgery

## 2024-04-24 ENCOUNTER — Encounter: Payer: Self-pay | Admitting: Podiatry

## 2024-04-24 ENCOUNTER — Ambulatory Visit: Admitting: Podiatry

## 2024-04-24 DIAGNOSIS — L84 Corns and callosities: Secondary | ICD-10-CM

## 2024-04-24 DIAGNOSIS — E1142 Type 2 diabetes mellitus with diabetic polyneuropathy: Secondary | ICD-10-CM | POA: Diagnosis not present

## 2024-04-24 DIAGNOSIS — N186 End stage renal disease: Secondary | ICD-10-CM

## 2024-04-24 NOTE — Progress Notes (Signed)
 This patient presents to the office with severe callus under the ball of her right foot.  She has been seeing Dr.  Awanda for treatment of thick callus under her ball of right foot.  She denies trauma of her big toe joint right foot but she says there  is definite difference when she moves her big toes.  She says she was given a prescription for insoles and she has lost her prescription.  She has history of diabetes and ESRD. She presents to the office for evaluation and treatment.  Vascular  Dorsalis pedis and posterior tibial pulses are palpable  B/L.  Capillary return  WNL.  Temperature gradient is  WNL.  Skin turgor  WNL  Sensorium  Senn Weinstein monofilament wire  WNL. Normal tactile sensation.  Nail Exam  Patient has normal nails with no evidence of bacterial or fungal infection.  Orthopedic  Exam  Muscle tone and muscle strength  WNL.  No limitations of motion feet  B/L.  No crepitus or joint effusion noted.  Foot type is unremarkable and digits show no abnormalities.  Bony prominences are unremarkable. Hallux malleus right foot.  Hallux relatively is dorsiflexed causing retrograde pressure plantarly.  Skin  No open lesions.  Normal skin texture and turgor.   Callus and  necrotic tissue noted sub 1 right foot. Callus is severe and out of proportion to what I expect to see.  Callus right foot.  Debride callus with nail nipper, # 15 blade and dremel tool.  Dr.  Christine saw this patient and recommended dispersion padding.  RTC 10 weeks.

## 2024-05-17 ENCOUNTER — Other Ambulatory Visit

## 2024-05-17 ENCOUNTER — Ambulatory Visit: Admitting: Orthopedic Surgery

## 2024-05-17 DIAGNOSIS — M79641 Pain in right hand: Secondary | ICD-10-CM

## 2024-05-17 MED ORDER — MELOXICAM 7.5 MG PO TABS: 7.5000 mg | ORAL_TABLET | ORAL | 0 refills | Status: AC

## 2024-05-17 NOTE — Progress Notes (Signed)
" ° °  Patient: Lindsey Murillo           Date of Birth: 11-Apr-1996           MRN: [id number] Visit Date: 05/17/2024 Requested by: Cook, Jayce G, DO [address] [place],  [place] [address] PCP: Cook, Jayce G, DO  Encounter Diagnosis  Name Primary?   Pain in right hand Yes    Assessment and plan:  Unclear etiology.  The patient is on dialysis and maybe she is having some type of synovitis, differential includes amyloidosis, carpal tunnel syndrome,  Recommend brace and anti-inflammatories  If no improvement after 4 weeks to reevaluate  Meds ordered this encounter  Medications   meloxicam  (MOBIC ) 7.5 MG tablet    Sig: Take 1 tablet (7.5 mg total) by mouth every other day.    Dispense:  50 tablet    Refill:  0     Chief Complaint  Patient presents with   Hand Pain    R for 2-3 mos on and off. Across the top of the hand.     History:  29 year old female with pain at the metacarpal phalangeal joints of her right hand associated with cramping  But no numbness or tingling.  Focused exam findings:  I really find a normal exam.  There is no swelling there is normal range of motion there is no numbness or tingling she did have some tenderness over the volar aspect of the wrist and at the MTP joints  DG Hand Complete Right Result Date: 05/17/2024 Right hand x-ray pain over the dorsum of the hand at the metacarpal phalangeal joints Patient on dialysis X-rays show normal metacarpal phalangeal joints no periosteal erosions no joint space narrowing no soft tissue calcifications Impression normal hand      "

## 2024-05-17 NOTE — Patient Instructions (Signed)
 Wear brace and take medication for 4 weeks if after 4 weeks still having pain please make another appointment

## 2024-05-17 NOTE — Progress Notes (Signed)
" °  Intake history:  Chief Complaint  Patient presents with   Hand Pain    R for 2-3 mos on and off. Across the top of the hand.      There were no vitals taken for this visit. There is no height or weight on file to calculate BMI.  Pharmacy? [facility]  WHAT ARE WE SEEING YOU FOR TODAY?   right hand(s)  How long has this bothered you? (DOI?DOS?WS?)  approximately 2-3 month(s) ago  Was there an injury? No  Anticoag.  No   Any ALLERGIES ______________________________________________   Treatment:  Have you taken:  Tylenol  Yes  Advil  No  Had PT No  Had injection No  Other  _________________________     "

## 2024-06-14 ENCOUNTER — Encounter: Payer: Self-pay | Admitting: Nurse Practitioner

## 2024-06-14 ENCOUNTER — Ambulatory Visit: Admitting: Nurse Practitioner

## 2024-06-14 VITALS — BP 136/88 | HR 80 | Ht 69.0 in | Wt 124.6 lb

## 2024-06-14 DIAGNOSIS — I10 Essential (primary) hypertension: Secondary | ICD-10-CM | POA: Diagnosis not present

## 2024-06-14 DIAGNOSIS — E559 Vitamin D deficiency, unspecified: Secondary | ICD-10-CM

## 2024-06-14 DIAGNOSIS — E782 Mixed hyperlipidemia: Secondary | ICD-10-CM

## 2024-06-14 DIAGNOSIS — Z794 Long term (current) use of insulin: Secondary | ICD-10-CM | POA: Diagnosis not present

## 2024-06-14 DIAGNOSIS — N186 End stage renal disease: Secondary | ICD-10-CM

## 2024-06-14 DIAGNOSIS — E1022 Type 1 diabetes mellitus with diabetic chronic kidney disease: Secondary | ICD-10-CM

## 2024-06-14 DIAGNOSIS — Z992 Dependence on renal dialysis: Secondary | ICD-10-CM

## 2024-06-14 MED ORDER — OMNIPOD 5 DEXG7G6 PODS GEN 5 MISC: Status: AC

## 2024-06-14 MED ORDER — DEXCOM G7 SENSOR MISC: 3 refills | Status: AC

## 2024-06-14 NOTE — Progress Notes (Signed)
 "                                                                                        06/14/2024, 4:28 PM  Endocrinology follow-up note   Subjective:    Patient ID: Lindsey Murillo, female    DOB: 11-May-1995.  Azlynn R Deblois is being seen in follow-up after she was seen in consultation for management of currently uncontrolled symptomatic diabetes requested by  Cook, Jayce G, DO.   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 10/21/2021   Diabetic autonomic neuropathy ([name])    Diabetic peripheral neuropathy ([name])    Environmental allergies    Goiter    History of blood transfusion    Hypertension    no meds currently   Hypoglycemia associated with diabetes ([name])    Musculoskeletal pain 04/20/2022   Other disorders of phosphorus metabolism 10/04/2023   Tachycardia    Thyroiditis, autoimmune    Type 1 diabetes mellitus in patient age 30-19 years with HbA1C goal below 7.5     Past Surgical History:  Procedure Laterality Date   A/V FISTULAGRAM Right 03/23/2024   Procedure: A/V Fistulagram;  Surgeon: Gretta Lonni PARAS, MD;  Location: [facility];  Service: Cardiovascular;  Laterality: Right;   AV FISTULA PLACEMENT Right 12/08/2021   Procedure: ARTERIOVENOUS  FISTULA CREATION VERSUS GRAFT;  Surgeon: Oris Krystal FALCON, MD;  Location: [facility];  Service: Vascular;  Laterality: Right;   BASCILIC VEIN TRANSPOSITION Right 01/12/2022   Procedure: RIGHT ARM SECOND STAGE BASILIC VEIN TRANSPOSITION;  Surgeon: Oris Krystal FALCON, MD;  Location: [facility];  Service: Vascular;  Laterality: Right;   BIOPSY  08/27/2016   Procedure: BIOPSY;  Surgeon: Margo LITTIE Haddock, MD;  Location: [facility];  Service: Endoscopy;;  duodenum; gastric   BIOPSY  06/27/2019   Procedure:  BIOPSY;  Surgeon: Shaaron Lamar HERO, MD;  Location: [facility];  Service: Endoscopy;;   BIOPSY  02/09/2022   Procedure: BIOPSY;  Surgeon: Cindie Carlin POUR, DO;  Location: [facility];  Service: Endoscopy;;   BREAST CYST EXCISION Right 11/12/2021   Procedure: RIGHT BREAST ABSCESS INCISION AND DRAINAGE;  Surgeon: Ebbie Cough, MD;  Location: [facility] OR;  Service: General;  Laterality: Right;  LMA   CAPD REMOVAL N/A 07/12/2023   Procedure: REMOVAL, CATHETER, CAPD;  Surgeon: Lanis Fonda BRAVO, MD;  [facility];  Service: Vascular;  Laterality: N/A;   COLONOSCOPY     COLONOSCOPY WITH PROPOFOL  N/A 02/09/2022   Procedure: COLONOSCOPY WITH PROPOFOL ;  Surgeon: Cindie Carlin POUR, DO;  Location: [facility];  Service: Endoscopy;  Laterality: N/A;  7:30am, asa 3, dialysis pt   ESOPHAGOGASTRODUODENOSCOPY N/A 08/27/2016   Dr. Haddock: mild gastritis. Negative celiac. No obvious source for dyspepsia/diarrhea   ESOPHAGOGASTRODUODENOSCOPY (EGD) WITH PROPOFOL  N/A 06/27/2019   rourk: Focal abnormality of the gastric mucosa likely due to trauma (heaving).  Biopsy showed mild  gastritis, negative for H. pylori.  Esophageal dilation for history of dysphagia but normal-appearing esophagus.   ESOPHAGOGASTRODUODENOSCOPY (EGD) WITH PROPOFOL  N/A 02/09/2022   Procedure: ESOPHAGOGASTRODUODENOSCOPY (EGD) WITH PROPOFOL ;  Surgeon: Cindie Carlin POUR, DO;  [facility];  Service: Endoscopy;  Laterality: N/A;   IR FLUORO GUIDE CV LINE RIGHT  10/13/2021   IR FLUORO GUIDE CV LINE RIGHT  10/19/2021   IR US  GUIDE VASC ACCESS RIGHT  10/13/2021   IRRIGATION AND DEBRIDEMENT ABSCESS N/A 10/04/2021   Procedure: IRRIGATION AND DEBRIDEMENT NECK ABSCESS;  Surgeon: Rubin Calamity, MD;  Location: [facility] OR;  Service: General;  Laterality: N/A;   TRANSESOPHAGEAL ECHOCARDIOGRAM (CATH LAB) N/A 07/08/2023   Procedure: TRANSESOPHAGEAL ECHOCARDIOGRAM;  Surgeon: Loni Soyla LABOR, MD;  Location: [facility];  Service: Cardiovascular;   Laterality: N/A;   VENOUS ANGIOPLASTY Right 03/23/2024   Procedure: VENOUS ANGIOPLASTY;  Surgeon: Gretta Lonni PARAS, MD;  Location: [facility];  Service: Cardiovascular;  Laterality: Right;  Proximal AVF/Basilic Vein    Social History   Socioeconomic History   Marital status: Single    Spouse name: Not on file   Number of children: 0   Years of education: Not on file   Highest education level: High school graduate  Occupational History   Occupation: unemployed  Tobacco Use   Smoking status: Never    Passive exposure: Never   Smokeless tobacco: Never  Vaping Use   Vaping status: Never Used  Substance and Sexual Activity   Alcohol  use: No   Drug use: No   Sexual activity: Not Currently    Birth control/protection: Abstinence, None  Other Topics Concern   Not on file  Social History Narrative   Not on file   Social Drivers of Health   Tobacco Use: Low Risk (06/14/2024)   Patient History    Smoking Tobacco Use: Never    Smokeless Tobacco Use: Never    Passive Exposure: Never  Financial Resource Strain: Low Risk (04/28/2023)   Received from [facility]   Overall Financial Resource Strain (CARDIA)    Difficulty of Paying Living Expenses: Not hard at all  Food Insecurity: No Food Insecurity (07/21/2023)   Hunger Vital Sign    Worried About Running Out of Food in the Last Year: Never true    Ran Out of Food in the Last Year: Never true  Transportation Needs: No Transportation Needs (07/21/2023)   PRAPARE - Administrator, Civil Service (Medical): No    Lack of Transportation (Non-Medical): No  Physical Activity: Insufficiently Active (01/14/2023)   Exercise Vital Sign    Days of Exercise per Week: 4 days    Minutes of Exercise per Session: 30 min  Stress: No Stress Concern Present (06/01/2024)   Received from [facility] of Occupational Health - Occupational Stress Questionnaire    Do you feel stress - tense, restless, nervous, or  anxious, or unable to Murillo at night because your mind is troubled all the time - these days?: Not at all  Social Connections: Moderately Isolated (01/14/2023)   Social Connection and Isolation Panel    Frequency of Communication with Friends and Family: More than three times a week    Frequency of Social Gatherings with Friends and Family: More than three times a week    Attends Religious Services: More than 4 times per year    Active Member of [employer] or Organizations: No    Attends Banker Meetings: Never  Marital Status: Never married  Depression (PHQ2-9): Low Risk (02/14/2024)   Depression (PHQ2-9)    PHQ-2 Score: 2  Alcohol  Screen: Low Risk (01/14/2023)   Alcohol  Screen    Last Alcohol  Screening Score (AUDIT): 0  Housing: Low Risk (07/21/2023)   Housing Stability Vital Sign    Unable to Pay for Housing in the Last Year: No    Number of Times Moved in the Last Year: 1    Homeless in the Last Year: No  Utilities: Not At Risk (07/21/2023)   AHC Utilities    Threatened with loss of utilities: No  Health Literacy: Adequate Health Literacy (01/14/2023)   B1300 Health Literacy    Frequency of need for help with medical instructions: Never    Family History  Problem Relation Age of Onset   Alcohol  abuse Paternal Grandfather    Huntington's disease Maternal Grandfather    Cancer Father        pancreatic   Diabetes Mother        Type II DM   Colon cancer Neg Hx    Colon polyps Neg Hx     Outpatient Encounter Medications as of 06/14/2024  Medication Sig   acetaminophen  (TYLENOL ) 325 MG tablet Take 325 mg by mouth every 6 (six) hours as needed for mild pain (pain score 1-3) or moderate pain (pain score 4-6).   B Complex-C-Folic Acid  (DIALYVITE  800) 0.8 MG TABS Take 1 tablet by mouth daily.   calcitRIOL  (ROCALTROL ) 0.5 MCG capsule Take 1 mcg by mouth daily.   Continuous Glucose Receiver (DEXCOM G7 RECEIVER) DEVI by Does not apply route.   insulin  lispro (HUMALOG ) 100 UNIT/ML  injection Use with Omnipod for TDD around 50 units   lipase/protease/amylase (CREON ) 12000-38000 units CPEP capsule Take 2 capsules (24,000 Units total) by mouth 3 (three) times daily with meals.   loperamide  (IMODIUM ) 2 MG capsule Take 1 capsule (2 mg total) by mouth as needed for diarrhea or loose stools.   meclizine  (ANTIVERT ) 25 MG tablet Take 1 tablet (25 mg total) by mouth 2 (two) times daily as needed for dizziness.   meloxicam  (MOBIC ) 7.5 MG tablet Take 1 tablet (7.5 mg total) by mouth every other day.   metoprolol  succinate (TOPROL -XL) 50 MG 24 hr tablet Take 50 mg by mouth daily.   midodrine  (PROAMATINE ) 10 MG tablet Take 1 tablet (10 mg total) by mouth 3 (three) times daily.   Nutritional Supplements (,FEEDING SUPPLEMENT, PROSOURCE PLUS) liquid Take 30 mLs by mouth 2 (two) times daily between meals.   Olopatadine -Mometasone (RYALTRIS ) 665-25 MCG/ACT SUSP Place 2 sprays into the nose 2 (two) times daily as needed.   ondansetron  (ZOFRAN -ODT) 4 MG disintegrating tablet Take 1 tablet (4 mg total) by mouth every 8 (eight) hours as needed for nausea or vomiting.   pantoprazole  (PROTONIX ) 40 MG tablet Take 1 tablet (40 mg total) by mouth 2 (two) times daily before a meal.   sucroferric oxyhydroxide (VELPHORO ) 500 MG chewable tablet Chew 1,000 mg by mouth 3 (three) times daily with meals.   VENTOLIN  HFA 108 (90 Base) MCG/ACT inhaler Inhale 2 puffs into the lungs every 4 (four) hours as needed for wheezing or shortness of breath. 2 puffs once every 4 hours as needed for cough, wheeze, shortness of breath and chest tightness. May use 2 puffs 5 to 15 minutes before activity to decrease cough or wheeze.   zolpidem  (AMBIEN ) 5 MG tablet Take 1 tablet (5 mg total) by mouth at bedtime as needed for  Murillo.   [DISCONTINUED] Continuous Glucose Sensor (DEXCOM G7 SENSOR) MISC by Does not apply route.   [DISCONTINUED] Insulin  Disposable Pump (OMNIPOD 5 DEXG7G6 PODS GEN 5) MISC Change pod every 48-72 hours    Continuous Glucose Sensor (DEXCOM G7 SENSOR) MISC Change sensor every 10-15 days depending on which sensor she gets   Insulin  Disposable Pump (OMNIPOD 5 DEXG7G6 PODS GEN 5) MISC Change pod every 48-72 hours   No facility-administered encounter medications on file as of 06/14/2024.    ALLERGIES: No Known Allergies  VACCINATION STATUS: Immunization History  Administered Date(s) Administered   Hepb-cpg 10/30/2021, 11/25/2021, 12/23/2021, 02/24/2022   Influenza,inj,Quad PF,6+ Mos 03/13/2013, 02/25/2014, 04/09/2016, 01/26/2017, 01/18/2019, 06/15/2021, 02/08/2022, 02/08/2024   Moderna Covid-19 Fall Seasonal Vaccine 31yrs & older 02/04/2022, 01/25/2023   Moderna Sars-Covid-2 Vaccination 09/24/2019, 10/22/2019   PNEUMOCOCCAL CONJUGATE-20 10/28/2021   Td 04/15/2017   Td (Adult),5 Lf Tetanus Toxid, Preservative Free 04/15/2017    Diabetes She presents for her follow-up diabetic visit. She has type 1 diabetes mellitus. Her disease course has been improving. There are no hypoglycemic associated symptoms. Pertinent negatives for diabetes include no polydipsia and no polyuria. There are no hypoglycemic complications. Symptoms are improving. Diabetic complications include nephropathy and peripheral neuropathy. (Non adherence to medical treatment.) Risk factors for coronary artery disease include diabetes mellitus, dyslipidemia and sedentary lifestyle. Current diabetic treatment includes insulin  pump. She is compliant with treatment most of the time. Her weight is fluctuating minimally. She is following a generally unhealthy diet. When asked about meal planning, she reported none. She never participates in exercise. Her home blood glucose trend is decreasing steadily. Her overall blood glucose range is 140-180 mg/dl. (She presents today with her CGM showing at goal glycemic profile overall.  Her A1c was checked at HD about 2 months ago and reports it was 6.4%, improving from last visit of 9%.   Analysis of her  CGM shows TIR 82%, TAR 17%, TBR 1%.  She is doing well with her Omnipod 5!) An ACE inhibitor/angiotensin II receptor blocker is being taken.    Review of systems  Constitutional: + Minimally fluctuating body weight,  current Body mass index is 18.4 kg/m. , no fatigue, no subjective hyperthermia, no subjective hypothermia Eyes: + blurry vision, no xerophthalmia ENT: no sore throat, no nodules palpated in throat, no dysphagia/odynophagia, no hoarseness Cardiovascular: no chest pain, no shortness of breath, no palpitations, no leg swelling Respiratory: no cough, no shortness of breath Gastrointestinal: no nausea/vomiting/diarrhea Musculoskeletal: walks with cane Skin: no rashes, no hyperemia Neurological: no tremors, no numbness, no tingling, no dizziness Psychiatric: no depression, no anxiety   Objective:    BP 136/88 (BP Location: Left Arm, Patient Position: Sitting, Cuff Size: Large)   Pulse 80   Ht 5' 9 (1.753 m)   Wt 124 lb 9.6 oz (56.5 kg)   BMI 18.40 kg/m   Wt Readings from Last 3 Encounters:  06/14/24 124 lb 9.6 oz (56.5 kg)  02/14/24 119 lb (54 kg)  02/08/24 120 lb 12.8 oz (54.8 kg)    BP Readings from Last 3 Encounters:  06/14/24 136/88  03/23/24 (!) 173/110  02/14/24 (!) 86/55     Physical Exam- Limited  Constitutional:  Body mass index is 18.4 kg/m. , not in acute distress, mildly anxious/distracted state of mind Eyes:  EOMI, no exophthalmos Musculoskeletal: walks with cane Skin:  no rashes, no hyperemia Neurological: no tremor with outstretched hands   Diabetic Foot Exam - Simple   No data filed  CMP ( most recent) CMP     Component Value Date/Time   NA 136 07/19/2023 0516   NA 132 (L) 09/24/2021 1453   K 3.7 07/19/2023 0516   CL 104 07/19/2023 0516   CO2 26 07/19/2023 0516   GLUCOSE 157 (H) 07/19/2023 0516   BUN 10 07/19/2023 0516   BUN 36 (H) 09/24/2021 1453   CREATININE 4.10 (H) 07/19/2023 0516   CREATININE 0.98 08/29/2019 1637    CALCIUM  7.9 (L) 07/19/2023 0516   PROT 4.8 (L) 07/18/2023 0535   PROT 5.5 (L) 09/24/2021 1453   ALBUMIN  <1.5 (L) 07/18/2023 0535   ALBUMIN  2.1 (L) 09/24/2021 1453   AST 19 07/18/2023 0535   ALT <5 07/18/2023 0535   ALKPHOS 89 07/18/2023 0535   BILITOT 0.4 07/18/2023 0535   BILITOT <0.2 09/24/2021 1453   GFRNONAA 14 (L) 07/19/2023 0516   GFRAA >60 10/27/2019 2021     Diabetic Labs (most recent): Lab Results  Component Value Date   HGBA1C 9.0 (A) 02/08/2024   HGBA1C 7.3 (A) 10/05/2023   HGBA1C 7.2 (A) 06/23/2023   MICROALBUR 1,195.2 (H) 06/13/2021   MICROALBUR 0.50 11/28/2012   MICROALBUR 0.50 07/20/2012     Lipid Panel ( most recent) Lipid Panel     Component Value Date/Time   CHOL 190 07/20/2022 1440   TRIG 500 (H) 07/20/2022 1440   HDL 73 07/20/2022 1440   CHOLHDL 2.6 07/20/2022 1440   CHOLHDL 2.0 11/05/2015 1252   VLDL 12 11/05/2015 1252   LDLCALC 46 07/20/2022 1440   LABVLDL 71 (H) 07/20/2022 1440      Lab Results  Component Value Date   TSH 2.185 07/17/2023   TSH 1.060 10/26/2022   TSH 0.975 10/03/2021   TSH 2.520 05/26/2021   TSH 1.802 05/14/2021   TSH 0.342 (L) 05/27/2018   TSH 0.55 11/05/2015   TSH 1.175 11/28/2012   TSH 0.706 07/20/2012   TSH 1.008 08/26/2011   FREET4 1.19 10/26/2022   FREET4 1.4 11/05/2015   FREET4 1.26 11/28/2012   FREET4 1.44 07/20/2012   FREET4 1.23 08/26/2011      Assessment & Plan:   1) Uncontrolled type 1 diabetes mellitus with ESRD    Sarely R Lightsey has currently uncontrolled symptomatic type (?1) DM since 29 years of age.  She presents today with her CGM showing gross hyperglycemia overall.  Her POCT A1c today is 9%, increasing from last visit of 7.3%.   Analysis of her CGM shows TIR 27%, TAR 73%, TBR 0% with a GMI of 9.6%.  She has her Omnipod 5 kit and supplies, scheduled to have training tomorrow.  - I had a long discussion with her about the progressive nature of diabetes and the pathology behind its  complications. -her diabetes is complicated by peripheral neuropathy, ESRD, noncompliance/nonadherence, peripheral ? Lymphedema she remains at a high risk for more acute and chronic complications which include CAD, CVA, CKD, retinopathy, and neuropathy. These are all discussed in detail with her.  - Nutritional counseling repeated/built upon at each appointment.  - The patient admits there is a room for improvement in their diet and drink choices. -  Suggestion is made for the patient to avoid simple carbohydrates from their diet including Cakes, Sweet Desserts / Pastries, Ice Cream, Soda (diet and regular), Sweet Tea, Candies, Chips, Cookies, Sweet Pastries, Store Bought Juices, Alcohol  in Excess of 1-2 drinks a day, Artificial Sweeteners, Coffee Creamer, and Sugar-free Products. This will help patient to have stable blood glucose profile  and potentially avoid unintended weight gain.   - I encouraged the patient to switch to unprocessed or minimally processed complex starch and increased protein intake (animal or plant source), fruits, and vegetables.   - Patient is advised to stick to a routine mealtimes to eat 3 meals a day and avoid unnecessary snacks (to snack only to correct hypoglycemia).  - I have approached her with the following plan to manage  her diabetes and patient agrees:   -I did adjust her max basal rate to 5 units per hour to help prevent her pump going into manual mode.  She is loving her CGM Omnipod combo.  She is advised to monitor glucose 4 times daily (using her CGM) and call the clinic if she has readings less than 70 or above 200 for 3 tests in a row.    -We did talk about possibility of insulin  pump in the future. I did go ahead and send in for the Omnipod 5 to [facility] pharmacy to see if it is affordable for her.  - she is warned not to take insulin  without proper monitoring per orders. - She does not have a suitable , non insulin  option to treat her diabetes.  -  Specific targets for  A1c;  LDL, HDL,  and Triglycerides were discussed with the patient.  2) Blood Pressure /Hypertension:  -Her blood pressure is controlled to target without the use of antihypertensives.  Changes will be deferred to nephrology.  3) Lipids/Hyperlipidemia:  Her recent lipid panel from 07/20/22 shows controlled LDL of 46 and significantly elevated triglycerides of 500.  she is advised to continue Lipitor 10 mg p.o. daily at bedtime.  Side effects and precautions discussed with her.  She has upcoming appt with PCP for annual labs, will defer to him.  4)  Weight/Diet:  Body mass index is 18.4 kg/m.   She is not a candidate for weight loss.    5) Chronic Care/Health Maintenance: -she is not on ACEI/ARB and is on Statin medications and  is encouraged to initiate and continue to follow up with Ophthalmology, Dentist,  Podiatrist at least yearly or according to recommendations, and advised to  stay away from smoking. I have recommended yearly flu vaccine and pneumonia vaccine at least every 5 years; moderate intensity exercise for up to 150 minutes weekly; and  Murillo for 7- 9 hours a day.  - she is  advised to maintain close follow up with Cook, Jayce G, DO for primary care needs, as well as her other providers for optimal and coordinated care.     I spent  46  minutes in the care of the patient today including review of labs from CMP, Lipids, Thyroid  Function, Hematology (current and previous including abstractions from other facilities); face-to-face time discussing  her blood glucose readings/logs, discussing hypoglycemia and hyperglycemia episodes and symptoms, medications doses, her options of short and long term treatment based on the latest standards of care / guidelines;  discussion about incorporating lifestyle medicine;  and documenting the encounter. Risk reduction counseling performed per USPSTF guidelines to reduce obesity and cardiovascular risk factors.     Please refer  to Patient Instructions for Blood Glucose Monitoring and Insulin /Medications Dosing Guide  in media tab for additional information. Please  also refer to  Patient Self Inventory in the Media  tab for reviewed elements of pertinent patient history.  Mirant participated in the discussions, expressed understanding, and voiced agreement with the above plans.  All questions  were answered to her satisfaction. she is encouraged to contact clinic should she have any questions or concerns prior to her return visit.   Follow up plan: - Return in about 4 months (around 10/12/2024) for Diabetes F/U with A1c in office, No previsit labs, Bring meter and logs.   Benton Rio, [facility] [address] [place], [place] [address] Phone: [phone] Fax: [phone]  "

## 2024-07-03 ENCOUNTER — Ambulatory Visit: Admitting: Podiatry

## 2024-08-14 ENCOUNTER — Ambulatory Visit: Admitting: Family Medicine

## 2024-10-16 ENCOUNTER — Ambulatory Visit: Admitting: Nurse Practitioner
